# Patient Record
Sex: Female | Born: 1945 | Race: Black or African American | Hispanic: No | Marital: Married | Smoking: Former smoker
Health system: Southern US, Community
[De-identification: ages and names within clinical notes are randomized; demographics above are authoritative.]

## PROBLEM LIST (undated history)

## (undated) DIAGNOSIS — Z972 Presence of dental prosthetic device (complete) (partial): Secondary | ICD-10-CM

## (undated) DIAGNOSIS — J45909 Unspecified asthma, uncomplicated: Secondary | ICD-10-CM

## (undated) DIAGNOSIS — K5909 Other constipation: Secondary | ICD-10-CM

## (undated) DIAGNOSIS — I480 Paroxysmal atrial fibrillation: Secondary | ICD-10-CM

## (undated) DIAGNOSIS — I639 Cerebral infarction, unspecified: Secondary | ICD-10-CM

## (undated) DIAGNOSIS — K2970 Gastritis, unspecified, without bleeding: Secondary | ICD-10-CM

## (undated) DIAGNOSIS — D696 Thrombocytopenia, unspecified: Secondary | ICD-10-CM

## (undated) DIAGNOSIS — E042 Nontoxic multinodular goiter: Secondary | ICD-10-CM

## (undated) DIAGNOSIS — J449 Chronic obstructive pulmonary disease, unspecified: Secondary | ICD-10-CM

## (undated) DIAGNOSIS — K219 Gastro-esophageal reflux disease without esophagitis: Secondary | ICD-10-CM

## (undated) DIAGNOSIS — F419 Anxiety disorder, unspecified: Secondary | ICD-10-CM

## (undated) DIAGNOSIS — E785 Hyperlipidemia, unspecified: Secondary | ICD-10-CM

## (undated) DIAGNOSIS — Z8719 Personal history of other diseases of the digestive system: Secondary | ICD-10-CM

## (undated) DIAGNOSIS — R0600 Dyspnea, unspecified: Secondary | ICD-10-CM

## (undated) DIAGNOSIS — I6930 Unspecified sequelae of cerebral infarction: Secondary | ICD-10-CM

## (undated) DIAGNOSIS — C50919 Malignant neoplasm of unspecified site of unspecified female breast: Secondary | ICD-10-CM

## (undated) DIAGNOSIS — R0609 Other forms of dyspnea: Secondary | ICD-10-CM

## (undated) DIAGNOSIS — E0590 Thyrotoxicosis, unspecified without thyrotoxic crisis or storm: Secondary | ICD-10-CM

## (undated) DIAGNOSIS — Z7901 Long term (current) use of anticoagulants: Secondary | ICD-10-CM

## (undated) DIAGNOSIS — I5022 Chronic systolic (congestive) heart failure: Secondary | ICD-10-CM

## (undated) DIAGNOSIS — I2510 Atherosclerotic heart disease of native coronary artery without angina pectoris: Secondary | ICD-10-CM

## (undated) DIAGNOSIS — J9383 Other pneumothorax: Secondary | ICD-10-CM

## (undated) DIAGNOSIS — M1990 Unspecified osteoarthritis, unspecified site: Secondary | ICD-10-CM

## (undated) DIAGNOSIS — I350 Nonrheumatic aortic (valve) stenosis: Secondary | ICD-10-CM

## (undated) DIAGNOSIS — S329XXA Fracture of unspecified parts of lumbosacral spine and pelvis, initial encounter for closed fracture: Secondary | ICD-10-CM

## (undated) DIAGNOSIS — N183 Chronic kidney disease, stage 3 unspecified: Secondary | ICD-10-CM

## (undated) DIAGNOSIS — Z8709 Personal history of other diseases of the respiratory system: Secondary | ICD-10-CM

## (undated) DIAGNOSIS — M810 Age-related osteoporosis without current pathological fracture: Secondary | ICD-10-CM

## (undated) DIAGNOSIS — I10 Essential (primary) hypertension: Secondary | ICD-10-CM

## (undated) DIAGNOSIS — T7840XA Allergy, unspecified, initial encounter: Secondary | ICD-10-CM

## (undated) DIAGNOSIS — Z923 Personal history of irradiation: Secondary | ICD-10-CM

## (undated) HISTORY — DX: Age-related osteoporosis without current pathological fracture: M81.0

## (undated) HISTORY — DX: Paroxysmal atrial fibrillation: I48.0

## (undated) HISTORY — PX: CARDIAC CATHETERIZATION: SHX172

## (undated) HISTORY — DX: Chronic obstructive pulmonary disease, unspecified: J44.9

## (undated) HISTORY — PX: VALVE REPLACEMENT: SUR13

## (undated) HISTORY — DX: Allergy, unspecified, initial encounter: T78.40XA

---

## 1998-03-01 DIAGNOSIS — I639 Cerebral infarction, unspecified: Secondary | ICD-10-CM

## 1998-03-01 HISTORY — DX: Cerebral infarction, unspecified: I63.9

## 2014-02-12 HISTORY — PX: CARDIAC CATHETERIZATION: SHX172

## 2014-03-01 DIAGNOSIS — I5022 Chronic systolic (congestive) heart failure: Secondary | ICD-10-CM

## 2014-03-01 DIAGNOSIS — I428 Other cardiomyopathies: Secondary | ICD-10-CM

## 2014-03-01 HISTORY — DX: Chronic systolic (congestive) heart failure: I50.22

## 2014-03-01 HISTORY — DX: Other cardiomyopathies: I42.8

## 2014-03-12 DIAGNOSIS — I350 Nonrheumatic aortic (valve) stenosis: Secondary | ICD-10-CM

## 2014-03-12 HISTORY — DX: Nonrheumatic aortic (valve) stenosis: I35.0

## 2014-03-12 HISTORY — PX: AORTIC VALVE REPLACEMENT: SHX41

## 2014-11-19 HISTORY — PX: VIDEO ASSISTED THORACOSCOPY (VATS) W/TALC PLEUADESIS: SHX6168

## 2015-08-06 ENCOUNTER — Ambulatory Visit (HOSPITAL_COMMUNITY)
Admission: EM | Admit: 2015-08-06 | Discharge: 2015-08-06 | Disposition: A | Discharge status: Home / Self Care | Payer: Medicare Other | Attending: Emergency Medicine | Admitting: Emergency Medicine

## 2015-08-06 ENCOUNTER — Encounter (HOSPITAL_COMMUNITY): Payer: Self-pay | Admitting: Nurse Practitioner

## 2015-08-06 DIAGNOSIS — W1839XA Other fall on same level, initial encounter: Secondary | ICD-10-CM

## 2015-08-06 DIAGNOSIS — W010XXA Fall on same level from slipping, tripping and stumbling without subsequent striking against object, initial encounter: Secondary | ICD-10-CM

## 2015-08-06 DIAGNOSIS — R0789 Other chest pain: Secondary | ICD-10-CM

## 2015-08-06 HISTORY — DX: Unspecified asthma, uncomplicated: J45.909

## 2015-08-06 HISTORY — DX: Essential (primary) hypertension: I10

## 2015-08-06 MED ORDER — TRAMADOL-ACETAMINOPHEN 37.5-325 MG PO TABS: 1.0000 | ORAL_TABLET | Freq: Four times a day (QID) | ORAL | Status: DC | PRN

## 2015-08-06 NOTE — ED Provider Notes (Signed)
CSN: [id number]     Arrival date & time 08/06/15  1316 History   First MD Initiated Contact with Patient 08/06/15 1423     Chief Complaint  Patient presents with  . Rib Injury   (Consider location/radiation/quality/duration/timing/severity/associated sxs/prior Treatment) HPI Comments: 70 year old female that she fell one week ago and during the fall braced herself with her left arm. Since that time she has had soreness to the left lateral chest wall and left breast. She states the pain is primarily present with cough and sniffing. She does not have an increasing cough. She does not have shortness of breath or DOE. Denies anterior chest pain. She states most of the discomfort is superficial along the lateral aspect of the left breast and chest wall. She states that the pain is not worse and she has not developed any new symptoms. She has been taking Tylenol, 3 tablets at a time and it only partially relieves the pain. She is pleasant, alert, jovial, energetic and showing no signs of distress.   Past Medical History  Diagnosis Date  . Hypertension   . Asthma    History reviewed. No pertinent past surgical history. History reviewed. No pertinent family history. Social History  Substance Use Topics  . Smoking status: Never Smoker   . Smokeless tobacco: None  . Alcohol Use: None   OB History    No data available     Review of Systems  Constitutional: Negative.   HENT: Negative.   Respiratory: Negative for cough, chest tightness and shortness of breath.   Cardiovascular: Positive for chest pain.       No heaviness, tightness, fullness or pressure. No anterior chest pain.  Gastrointestinal: Negative.   Musculoskeletal: Negative for back pain, gait problem, neck pain and neck stiffness.  Skin:       Small area of ecchymosis to the left lateral lower chest wall along the costal margin  Neurological: Negative.   All other systems reviewed and are negative.   Allergies  Review of  patient's allergies indicates no known allergies.  Home Medications   Prior to Admission medications   Medication Sig Start Date End Date Taking? Authorizing Provider  albuterol (PROVENTIL) (2.5 MG/3ML) 0.083% nebulizer solution    Yes Historical Provider, MD  carvedilol (COREG) 6.25 MG tablet Take by mouth.   Yes Historical Provider, MD  lisinopril (PRINIVIL,ZESTRIL) 5 MG tablet Take by mouth.   Yes Historical Provider, MD  senna-docusate (SENOKOT-S) 8.6-50 MG tablet daily.   Yes Historical Provider, MD  warfarin (COUMADIN) 1 MG tablet Take by mouth daily.   Yes Historical Provider, MD  traMADol-acetaminophen (ULTRACET) 37.5-325 MG tablet Take 1 tablet by mouth every 6 (six) hours as needed. 08/06/15   Janne Napoleon, NP   Meds Ordered and Administered this Visit  Medications - No data to display  BP 149/91 mmHg  Pulse 87  Temp(Src) 97.6 F (36.4 C) (Oral)  Resp 17  SpO2 100% No data found.   Physical Exam  Constitutional: She is oriented to person, place, and time. She appears well-developed and well-nourished. No distress.  Eyes: EOM are normal.  Neck: Normal range of motion. Neck supple.  Cardiovascular: Normal rate, regular rhythm and normal heart sounds.   Pulmonary/Chest: Effort normal and breath sounds normal. No respiratory distress. She has no wheezes. She has no rales.  Minor tenderness to the left lateral chest wall and left lateral breast. No palpable deformities. No discolorations, swelling. Lung sounds are clear. No rubs or other adventitious  sounds. Good air movement and expansion.  Abdominal: Soft. There is no tenderness.  Musculoskeletal: Normal range of motion. She exhibits no edema.  Neurological: She is alert and oriented to person, place, and time. She exhibits normal muscle tone.  Skin: Skin is warm and dry. No erythema.  Psychiatric: She has a normal mood and affect.  Nursing note and vitals reviewed.   ED Course  Procedures (including critical care  time)  Labs Review Labs Reviewed - No data to display  Imaging Review No results found.   Visual Acuity Review  Right Eye Distance:   Left Eye Distance:   Bilateral Distance:    Right Eye Near:   Left Eye Near:    Bilateral Near:         MDM   1. Fall from slip, trip, or stumble, initial encounter    Meds ordered this encounter  Medications  . carvedilol (COREG) 6.25 MG tablet    Sig: Take by mouth.  Marland Kitchen lisinopril (PRINIVIL,ZESTRIL) 5 MG tablet    Sig: Take by mouth.  . warfarin (COUMADIN) 1 MG tablet    Sig: Take by mouth daily.  Marland Kitchen albuterol (PROVENTIL) (2.5 MG/3ML) 0.083% nebulizer solution    Sig:   . senna-docusate (SENOKOT-S) 8.6-50 MG tablet    Sig: daily.  . traMADol-acetaminophen (ULTRACET) 37.5-325 MG tablet    Sig: Take 1 tablet by mouth every 6 (six) hours as needed.    Dispense:  20 tablet    Refill:  0    Order Specific Question:  Supervising Provider    Answer:  Melony Overly [[id number]]   Tramadol Rx only Cold compresses Red flags and info on chestwall injuries and strains.    Janne Napoleon, NP 08/06/15 1454

## 2015-08-06 NOTE — ED Notes (Addendum)
She c/o 6 day history of L sided rib pain after a fall. Pain is increased by inspiration and cough. She has been taking tylenol at home but pain has not improved. She is on coumadin, bruise noted to L chest wall. She is alert and breathing easily

## 2015-08-19 ENCOUNTER — Observation Stay (HOSPITAL_COMMUNITY)
Admission: EM | Admit: 2015-08-19 | Discharge: 2015-08-21 | Disposition: A | Discharge status: Home / Self Care | Payer: Medicare Other | Attending: Internal Medicine | Admitting: Internal Medicine

## 2015-08-19 ENCOUNTER — Emergency Department (HOSPITAL_COMMUNITY): Payer: Medicare Other

## 2015-08-19 ENCOUNTER — Encounter (HOSPITAL_COMMUNITY): Payer: Self-pay | Admitting: Emergency Medicine

## 2015-08-19 DIAGNOSIS — Z8673 Personal history of transient ischemic attack (TIA), and cerebral infarction without residual deficits: Secondary | ICD-10-CM | POA: Insufficient documentation

## 2015-08-19 DIAGNOSIS — I110 Hypertensive heart disease with heart failure: Secondary | ICD-10-CM | POA: Diagnosis not present

## 2015-08-19 DIAGNOSIS — E782 Mixed hyperlipidemia: Secondary | ICD-10-CM | POA: Diagnosis present

## 2015-08-19 DIAGNOSIS — B009 Herpesviral infection, unspecified: Secondary | ICD-10-CM | POA: Diagnosis not present

## 2015-08-19 DIAGNOSIS — S3210XA Unspecified fracture of sacrum, initial encounter for closed fracture: Secondary | ICD-10-CM | POA: Diagnosis not present

## 2015-08-19 DIAGNOSIS — J4520 Mild intermittent asthma, uncomplicated: Secondary | ICD-10-CM | POA: Diagnosis not present

## 2015-08-19 DIAGNOSIS — S32811A Multiple fractures of pelvis with unstable disruption of pelvic ring, initial encounter for closed fracture: Secondary | ICD-10-CM | POA: Diagnosis not present

## 2015-08-19 DIAGNOSIS — W19XXXA Unspecified fall, initial encounter: Secondary | ICD-10-CM | POA: Diagnosis not present

## 2015-08-19 DIAGNOSIS — I350 Nonrheumatic aortic (valve) stenosis: Secondary | ICD-10-CM | POA: Diagnosis not present

## 2015-08-19 DIAGNOSIS — Z952 Presence of prosthetic heart valve: Secondary | ICD-10-CM | POA: Insufficient documentation

## 2015-08-19 DIAGNOSIS — Y92009 Unspecified place in unspecified non-institutional (private) residence as the place of occurrence of the external cause: Secondary | ICD-10-CM

## 2015-08-19 DIAGNOSIS — I10 Essential (primary) hypertension: Secondary | ICD-10-CM | POA: Diagnosis present

## 2015-08-19 DIAGNOSIS — I4891 Unspecified atrial fibrillation: Secondary | ICD-10-CM | POA: Insufficient documentation

## 2015-08-19 DIAGNOSIS — E785 Hyperlipidemia, unspecified: Secondary | ICD-10-CM | POA: Diagnosis present

## 2015-08-19 DIAGNOSIS — J45909 Unspecified asthma, uncomplicated: Secondary | ICD-10-CM | POA: Diagnosis not present

## 2015-08-19 DIAGNOSIS — Y92099 Unspecified place in other non-institutional residence as the place of occurrence of the external cause: Secondary | ICD-10-CM

## 2015-08-19 DIAGNOSIS — I5022 Chronic systolic (congestive) heart failure: Secondary | ICD-10-CM | POA: Diagnosis present

## 2015-08-19 DIAGNOSIS — Z9181 History of falling: Secondary | ICD-10-CM

## 2015-08-19 DIAGNOSIS — I2510 Atherosclerotic heart disease of native coronary artery without angina pectoris: Secondary | ICD-10-CM | POA: Diagnosis present

## 2015-08-19 DIAGNOSIS — S329XXA Fracture of unspecified parts of lumbosacral spine and pelvis, initial encounter for closed fracture: Secondary | ICD-10-CM

## 2015-08-19 DIAGNOSIS — S32492A Other specified fracture of left acetabulum, initial encounter for closed fracture: Secondary | ICD-10-CM | POA: Diagnosis not present

## 2015-08-19 DIAGNOSIS — E0590 Thyrotoxicosis, unspecified without thyrotoxic crisis or storm: Secondary | ICD-10-CM | POA: Diagnosis present

## 2015-08-19 DIAGNOSIS — I480 Paroxysmal atrial fibrillation: Secondary | ICD-10-CM | POA: Diagnosis not present

## 2015-08-19 DIAGNOSIS — I2583 Coronary atherosclerosis due to lipid rich plaque: Secondary | ICD-10-CM

## 2015-08-19 DIAGNOSIS — S32810A Multiple fractures of pelvis with stable disruption of pelvic ring, initial encounter for closed fracture: Secondary | ICD-10-CM | POA: Diagnosis not present

## 2015-08-19 DIAGNOSIS — M25552 Pain in left hip: Secondary | ICD-10-CM | POA: Diagnosis not present

## 2015-08-19 HISTORY — DX: Nonrheumatic aortic (valve) stenosis: I35.0

## 2015-08-19 HISTORY — DX: Atherosclerotic heart disease of native coronary artery without angina pectoris: I25.10

## 2015-08-19 HISTORY — DX: Gastritis, unspecified, without bleeding: K29.70

## 2015-08-19 HISTORY — DX: Nontoxic multinodular goiter: E04.2

## 2015-08-19 HISTORY — DX: Fracture of unspecified parts of lumbosacral spine and pelvis, initial encounter for closed fracture: S32.9XXA

## 2015-08-19 HISTORY — DX: Hyperlipidemia, unspecified: E78.5

## 2015-08-19 HISTORY — DX: Thyrotoxicosis, unspecified without thyrotoxic crisis or storm: E05.90

## 2015-08-19 HISTORY — DX: Chronic systolic (congestive) heart failure: I50.22

## 2015-08-19 HISTORY — DX: Cerebral infarction, unspecified: I63.9

## 2015-08-19 HISTORY — DX: Unspecified osteoarthritis, unspecified site: M19.90

## 2015-08-19 LAB — BASIC METABOLIC PANEL
Anion gap: 7 (ref 5–15)
BUN: 17 mg/dL (ref 6–20)
CHLORIDE: 110 mmol/L (ref 101–111)
CO2: 23 mmol/L (ref 22–32)
CREATININE: 1.09 mg/dL — AB (ref 0.44–1.00)
Calcium: 10 mg/dL (ref 8.9–10.3)
GFR calc non Af Amer: 51 mL/min — ABNORMAL LOW (ref >60)
GFR, EST AFRICAN AMERICAN: 59 mL/min — AB (ref >60)
Glucose, Bld: 105 mg/dL — ABNORMAL HIGH (ref 65–99)
Potassium: 4.8 mmol/L (ref 3.5–5.1)
Sodium: 140 mmol/L (ref 135–145)

## 2015-08-19 LAB — CBC
HEMATOCRIT: 38.5 % (ref 36.0–46.0)
HEMOGLOBIN: 12.4 g/dL (ref 12.0–15.0)
MCH: 30.2 pg (ref 26.0–34.0)
MCHC: 32.2 g/dL (ref 30.0–36.0)
MCV: 93.7 fL (ref 78.0–100.0)
Platelets: 128 10*3/uL — ABNORMAL LOW (ref 150–400)
RBC: 4.11 MIL/uL (ref 3.87–5.11)
RDW: 13.2 % (ref 11.5–15.5)
WBC: 9.6 10*3/uL (ref 4.0–10.5)

## 2015-08-19 LAB — PROTIME-INR
INR: 2.69 — AB (ref 0.00–1.49)
Prothrombin Time: 28.2 seconds — ABNORMAL HIGH (ref 11.6–15.2)

## 2015-08-19 MED ORDER — ALBUTEROL SULFATE (2.5 MG/3ML) 0.083% IN NEBU: 3.0000 mL | INHALATION_SOLUTION | Freq: Four times a day (QID) | RESPIRATORY_TRACT | Status: DC | PRN

## 2015-08-19 MED ORDER — HYDROMORPHONE HCL 1 MG/ML IJ SOLN
0.5000 mg | Freq: Once | INTRAMUSCULAR | Status: AC
  Administered 2015-08-19: 0.5 mg via INTRAVENOUS
  Filled 2015-08-19: qty 1

## 2015-08-19 MED ORDER — SODIUM CHLORIDE 0.9% FLUSH: 3.0000 mL | INTRAVENOUS | Status: DC | PRN

## 2015-08-19 MED ORDER — LISINOPRIL 5 MG PO TABS
5.0000 mg | ORAL_TABLET | Freq: Every day | ORAL | Status: DC
  Filled 2015-08-19: qty 1

## 2015-08-19 MED ORDER — MAGNESIUM CITRATE PO SOLN: 1.0000 | Freq: Once | ORAL | Status: DC | PRN

## 2015-08-19 MED ORDER — WARFARIN SODIUM 7.5 MG PO TABS: 7.5000 mg | ORAL_TABLET | ORAL | Status: DC

## 2015-08-19 MED ORDER — SENNOSIDES-DOCUSATE SODIUM 8.6-50 MG PO TABS
1.0000 | ORAL_TABLET | Freq: Every day | ORAL | Status: DC
  Administered 2015-08-19 – 2015-08-21 (×2): 1 via ORAL
  Filled 2015-08-19 (×3): qty 1

## 2015-08-19 MED ORDER — FAMOTIDINE 20 MG PO TABS
20.0000 mg | ORAL_TABLET | Freq: Every day | ORAL | Status: DC
  Administered 2015-08-20 – 2015-08-21 (×2): 20 mg via ORAL
  Filled 2015-08-19 (×2): qty 1

## 2015-08-19 MED ORDER — ONDANSETRON HCL 4 MG/2ML IJ SOLN
4.0000 mg | Freq: Once | INTRAMUSCULAR | Status: AC
  Administered 2015-08-19: 4 mg via INTRAVENOUS
  Filled 2015-08-19: qty 2

## 2015-08-19 MED ORDER — MORPHINE SULFATE (PF) 4 MG/ML IV SOLN
4.0000 mg | Freq: Once | INTRAVENOUS | Status: AC
  Administered 2015-08-19: 4 mg via INTRAVENOUS
  Filled 2015-08-19: qty 1

## 2015-08-19 MED ORDER — SODIUM CHLORIDE 0.9% FLUSH
3.0000 mL | Freq: Two times a day (BID) | INTRAVENOUS | Status: DC
  Administered 2015-08-19 – 2015-08-20 (×3): 3 mL via INTRAVENOUS

## 2015-08-19 MED ORDER — ATORVASTATIN CALCIUM 40 MG PO TABS
40.0000 mg | ORAL_TABLET | Freq: Every day | ORAL | Status: DC
  Administered 2015-08-19 – 2015-08-20 (×2): 40 mg via ORAL
  Filled 2015-08-19 (×2): qty 1

## 2015-08-19 MED ORDER — ONDANSETRON HCL 4 MG PO TABS: 4.0000 mg | ORAL_TABLET | Freq: Four times a day (QID) | ORAL | Status: DC | PRN

## 2015-08-19 MED ORDER — SODIUM CHLORIDE 0.9 % IV SOLN: 250.0000 mL | INTRAVENOUS | Status: DC | PRN

## 2015-08-19 MED ORDER — METHIMAZOLE 5 MG PO TABS
15.0000 mg | ORAL_TABLET | Freq: Every evening | ORAL | Status: DC
  Administered 2015-08-19 – 2015-08-20 (×2): 15 mg via ORAL
  Filled 2015-08-19 (×3): qty 1

## 2015-08-19 MED ORDER — ONDANSETRON HCL 4 MG/2ML IJ SOLN: 4.0000 mg | Freq: Four times a day (QID) | INTRAMUSCULAR | Status: DC | PRN

## 2015-08-19 MED ORDER — MOMETASONE FURO-FORMOTEROL FUM 200-5 MCG/ACT IN AERO
2.0000 | INHALATION_SPRAY | Freq: Two times a day (BID) | RESPIRATORY_TRACT | Status: DC
  Administered 2015-08-19 – 2015-08-21 (×4): 2 via RESPIRATORY_TRACT
  Filled 2015-08-19 (×2): qty 8.8

## 2015-08-19 MED ORDER — ASPIRIN EC 81 MG PO TBEC
81.0000 mg | DELAYED_RELEASE_TABLET | Freq: Every day | ORAL | Status: DC
  Administered 2015-08-20 – 2015-08-21 (×2): 81 mg via ORAL
  Filled 2015-08-19 (×2): qty 1

## 2015-08-19 MED ORDER — MORPHINE SULFATE (PF) 4 MG/ML IV SOLN
4.0000 mg | INTRAVENOUS | Status: DC | PRN
  Administered 2015-08-19 – 2015-08-20 (×5): 4 mg via INTRAVENOUS
  Filled 2015-08-19 (×5): qty 1

## 2015-08-19 MED ORDER — WARFARIN SODIUM 5 MG PO TABS
5.0000 mg | ORAL_TABLET | ORAL | Status: DC
  Administered 2015-08-19: 5 mg via ORAL
  Filled 2015-08-19: qty 1

## 2015-08-19 MED ORDER — CARVEDILOL 6.25 MG PO TABS
18.7500 mg | ORAL_TABLET | Freq: Two times a day (BID) | ORAL | Status: DC
  Administered 2015-08-19 – 2015-08-20 (×2): 18.75 mg via ORAL
  Filled 2015-08-19 (×3): qty 1

## 2015-08-19 MED ORDER — ACETAMINOPHEN 500 MG PO TABS
500.0000 mg | ORAL_TABLET | Freq: Three times a day (TID) | ORAL | Status: DC | PRN
  Administered 2015-08-21 (×2): 1000 mg via ORAL
  Filled 2015-08-19 (×2): qty 2

## 2015-08-19 MED ORDER — SORBITOL 70 % SOLN: 30.0000 mL | Freq: Every day | Status: DC | PRN

## 2015-08-19 MED ORDER — WARFARIN - PHARMACIST DOSING INPATIENT: Freq: Every day | Status: DC

## 2015-08-19 MED ORDER — TRAMADOL HCL 50 MG PO TABS
50.0000 mg | ORAL_TABLET | Freq: Four times a day (QID) | ORAL | Status: DC | PRN
  Administered 2015-08-20 – 2015-08-21 (×4): 50 mg via ORAL
  Filled 2015-08-19 (×4): qty 1

## 2015-08-19 NOTE — Consult Note (Signed)
Reason for Consult:  Left pelvic pain after fall Referring Physician: Dr. Elie Goody Pulliam-McEachean is an 70 y.o. female.  HPI: 70 y/o female with PMH of CAD fell this morning from a standing height landing on her buttocks.  Danielle [name] denies LOC.  Danielle [name] was unable to bear weight on the L LE.  Danielle [name] denies any h/o injury or surgery to the left hip.  Danielle [name] c/o dull aching pain in the left pelvis that is mild at rest and more sharp and severe with motion of the LLE.  Danielle [name] denies numbness, tingling or weakness in the L LE.  Danielle [name] is not a smoker and is not diabetic.  Danielle [name] is admitted to the hospitalist service under the care of Dr. Rockne Menghini.  Past Medical History  Diagnosis Date  . Hypertension   . Asthma   . Hyperlipidemia   . Aortic stenosis     Status post bioprosthetic AVR  . Coronary artery disease   . Stroke ([place])   . Multiple thyroid nodules   . Hyperthyroidism   . Gastritis   . Chronic systolic CHF (congestive heart failure) (HCC)     EF 30% 04/2014    Past Surgical History  Procedure Laterality Date  . Valve replacement      Family History  Problem Relation Age of Onset  . Diabetes Mother   . Diabetes Father   . Diabetes Sister   . Diabetes Sister   . HIV Brother   . Lung cancer Father   . Heart attack Mother     Social History: Danielle [name] has a remote h/o cigarette smoking.  Danielle [name] recently moved to [place] with her husband from [place] to be closer to his family.  Danielle [name] is retired.  Allergies:  Allergies  Allergen Reactions  . Tetanus Toxoid Adsorbed Swelling    Unknown     Medications: I have reviewed the patient's current medications.  Results for orders placed or performed during the hospital encounter of 08/19/15 (from the past 48 hour(s))  CBC     Status: Abnormal   Collection Time: 08/19/15  4:42 PM  Result Value Ref Range   WBC 9.6 4.0 - 10.5 K/uL   RBC 4.11 3.87 - 5.11 MIL/uL   Hemoglobin 12.4 12.0 - 15.0 g/dL   HCT 38.5 36.0 - 46.0 %   MCV 93.7 78.0 - 100.0 fL    MCH 30.2 26.0 - 34.0 pg   MCHC 32.2 30.0 - 36.0 g/dL   RDW 13.2 11.5 - 15.5 %   Platelets 128 (L) 150 - 400 K/uL  Basic metabolic panel     Status: Abnormal   Collection Time: 08/19/15  4:42 PM  Result Value Ref Range   Sodium 140 135 - 145 mmol/L   Potassium 4.8 3.5 - 5.1 mmol/L   Chloride 110 101 - 111 mmol/L   CO2 23 22 - 32 mmol/L   Glucose, Bld 105 (H) 65 - 99 mg/dL   BUN 17 6 - 20 mg/dL   Creatinine, Ser 1.09 (H) 0.44 - 1.00 mg/dL   Calcium 10.0 8.9 - 10.3 mg/dL   GFR calc non Af Amer 51 (L) >60 mL/min   GFR calc Af Amer 59 (L) >60 mL/min    Comment: (NOTE) The eGFR has been calculated using the CKD EPI equation. This calculation has not been validated in all clinical situations. eGFR's persistently <60 mL/min signify possible Chronic Kidney Disease.    Anion gap 7 5 - 15  Protime-INR  Status: Abnormal   Collection Time: 08/19/15  4:42 PM  Result Value Ref Range   Prothrombin Time 28.2 (H) 11.6 - 15.2 seconds   INR 2.69 (H) 0.00 - 1.49    Ct Hip Left Wo Contrast  08/19/2015  CLINICAL DATA:  Posterior lateral left hip pain status post fall EXAM: CT OF THE LEFT HIP WITHOUT CONTRAST TECHNIQUE: Multidetector CT imaging of the left hip was performed according to the standard protocol. Multiplanar CT image reconstructions were also generated. COMPARISON:  None. FINDINGS: Bones/Joint/Cartilage Nondisplaced fracture of the left sacral ala. Nondisplaced fracture of the left superior pubic ramus at the acetabular junction. Minimally displaced fracture of the left inferior pubic ramus. No left hip fracture or dislocation. Joint space narrowing of the left hip with marginal osteophytosis consistent with mild-moderate osteoarthritis. Normal alignment. No aggressive lytic or sclerotic osseous lesion. Degenerative disc disease with disc height loss at L4-5. Muscles Normal. Soft tissue No fluid collection or hematoma.  No soft tissue mass. IMPRESSION: 1. Nondisplaced fracture of the left  superior pubic ramus at the acetabular junction. Minimally displaced fracture of the left inferior pubic ramus. 2. Nondisplaced fracture of the left sacral ala. Electronically Signed   By: Kathreen Devoid   On: 08/19/2015 16:23   Dg Hip Unilat With Pelvis 2-3 Views Left  08/19/2015  CLINICAL DATA:  Posterior and lateral left hip pain status post fall. EXAM: DG HIP (WITH OR WITHOUT PELVIS) 2-3V LEFT COMPARISON:  None. FINDINGS: There is no evidence of displaced left hip fracture, however the left hip is abnormally internally rotated. Curvilinear line is seen extending transversely through the acetabulum, raising the question about nondisplaced or minimally displaced acetabular fracture. Soft tissues are grossly normal. IMPRESSION: Abnormal rotation of the left hip with curvilinear line extending through the acetabulum. Query nondisplaced or minimally displaces fracture. Confirmation with cross-sectional imaging may be considered. Electronically Signed   By: Fidela Salisbury M.D.   On: 08/19/2015 13:53    ROS:  No recent f/c/n/v/wt loss PE:  Blood pressure 120/97, pulse 43, temperature 98.6 F (37 C), temperature source Oral, resp. rate 19, SpO2 96 %. wn wd woman in nad.  A and O X 4.  Mood and affect normal.  EOMI.  resp unlabored.  L pubic ramus TTP.  Pain is recreated with lateral compression of the pelvis.  L LE with healthy and intact skin.  2+ dp and pt pulses.  Feels LT normally throughout the L LE.  5/5 strength at the quad, tib ant, gastroc.  No lymphadenopathy.  Pain recreated with log roll of L LE.  Assessment/Plan: L pelvic LC fracture - I explained the nature of the injury to the patient in detail.  I believe this fracture can be treated successfully in closed fashion.  Danielle [name] can be TTWB on the L LE with PT starting tomorrow.  Danielle [name]'ll likely need pain control for 7-10 days.   Danielle [name] understands the plan and agrees.  I'll follow while Danielle [name]'s an inpatient and will plan to see her back in the  office.  Wylene Simmer 08/19/2015, 9:21 PM

## 2015-08-19 NOTE — ED Provider Notes (Signed)
CSN: [id number]     Arrival date & time 08/19/15  1254 History   First MD Initiated Contact with Patient 08/19/15 1504     Chief Complaint  Patient presents with  . Fall  . Hip Pain     (Consider location/radiation/quality/duration/timing/severity/associated sxs/prior Treatment) HPI Comments: Patient with history of frequent falls, h/o valve surgery in 2016 on coumadin last INR check >2mo -- presents with complaint of left hip pain after a fall occurring approximately 6 AM. Patient states that she was reaching for an item in her pantry and lost her balance and fell. She did not have any chest pain or shortness of breath and did not lose consciousness at the time of the fall. She did not hit her head or hurt her neck. She fell directly onto her buttocks. She complained of immediate pain in her tailbone area as well as her left hip. She was assisted up by family member and was able to take several steps with great pain. Family had to roll her to the car on a desk chair to transfer her to the hospital. Patient applied a heating pad and Aspercreme prior to arrival which did not help. The onset of this condition was acute. The course is constant. Aggravating factors: movement and palpation. Alleviating factors: none.    The history is provided by the patient and medical records.    Past Medical History  Diagnosis Date  . Hypertension   . Asthma    Past Surgical History  Procedure Laterality Date  . Valve replacement     No family history on file. Social History  Substance Use Topics  . Smoking status: Never Smoker   . Smokeless tobacco: None  . Alcohol Use: No   OB History    No data available     Review of Systems  Constitutional: Negative for fever and unexpected weight change.  HENT: Negative for rhinorrhea and sore throat.   Eyes: Negative for redness.  Respiratory: Negative for cough.   Cardiovascular: Negative for chest pain.  Gastrointestinal: Negative for nausea,  vomiting, abdominal pain, diarrhea and constipation.       Negative for fecal incontinence.   Genitourinary: Negative for dysuria, hematuria, flank pain, vaginal bleeding, vaginal discharge and pelvic pain.       Negative for urinary incontinence or retention.  Musculoskeletal: Positive for myalgias, back pain and gait problem.  Skin: Negative for rash.  Neurological: Negative for weakness, numbness and headaches.       Denies saddle paresthesias.      Allergies  Review of patient's allergies indicates no known allergies.  Home Medications   Prior to Admission medications   Medication Sig Start Date End Date Taking? Authorizing Provider  albuterol (PROVENTIL) (2.5 MG/3ML) 0.083% nebulizer solution     Historical Provider, MD  carvedilol (COREG) 6.25 MG tablet Take by mouth.    Historical Provider, MD  lisinopril (PRINIVIL,ZESTRIL) 5 MG tablet Take by mouth.    Historical Provider, MD  senna-docusate (SENOKOT-S) 8.6-50 MG tablet daily.    Historical Provider, MD  traMADol-acetaminophen (ULTRACET) 37.5-325 MG tablet Take 1 tablet by mouth every 6 (six) hours as needed. 08/06/15   Janne Napoleon, NP  warfarin (COUMADIN) 1 MG tablet Take by mouth daily.    Historical Provider, MD   BP 144/87 mmHg  Pulse 102  Temp(Src) 98.6 F (37 C) (Oral)  Resp 27  SpO2 96% Physical Exam  Constitutional: She appears well-developed and well-nourished.  HENT:  Head: Normocephalic and atraumatic.  Eyes: Conjunctivae are normal.  Neck: Normal range of motion. Neck supple.  Pulmonary/Chest: Effort normal.  Abdominal: Soft. There is no tenderness. There is no CVA tenderness.  Musculoskeletal:       Right hip: Normal.       Left hip: She exhibits decreased range of motion (pain with flexion against gravity), tenderness and bony tenderness (laterally).       Right knee: Normal.       Left knee: Normal.       Left ankle: Normal.       Cervical back: Normal.       Thoracic back: Normal.       Lumbar  back: She exhibits tenderness (sacrum/coccyx) and bony tenderness.       Left upper leg: Normal.       Left lower leg: Normal.       Left foot: Normal.  No step-off noted with palpation of spine.   Neurological: She is alert. She has normal strength and normal reflexes. No sensory deficit.  5/5 strength in entire lower extremities bilaterally. No sensation deficit.   Skin: Skin is warm and dry. No rash noted.  Psychiatric: She has a normal mood and affect.  Nursing note and vitals reviewed.   ED Course  Procedures (including critical care time) Labs Review Labs Reviewed  CBC - Abnormal; Notable for the following:    Platelets 128 (*)    All other components within normal limits  BASIC METABOLIC PANEL - Abnormal; Notable for the following:    Glucose, Bld 105 (*)    Creatinine, Ser 1.09 (*)    GFR calc non Af Amer 51 (*)    GFR calc Af Amer 59 (*)    All other components within normal limits  PROTIME-INR - Abnormal; Notable for the following:    Prothrombin Time 28.2 (*)    INR 2.69 (*)    All other components within normal limits    Imaging Review Ct Hip Left Wo Contrast  08/19/2015  CLINICAL DATA:  Posterior lateral left hip pain status post fall EXAM: CT OF THE LEFT HIP WITHOUT CONTRAST TECHNIQUE: Multidetector CT imaging of the left hip was performed according to the standard protocol. Multiplanar CT image reconstructions were also generated. COMPARISON:  None. FINDINGS: Bones/Joint/Cartilage Nondisplaced fracture of the left sacral ala. Nondisplaced fracture of the left superior pubic ramus at the acetabular junction. Minimally displaced fracture of the left inferior pubic ramus. No left hip fracture or dislocation. Joint space narrowing of the left hip with marginal osteophytosis consistent with mild-moderate osteoarthritis. Normal alignment. No aggressive lytic or sclerotic osseous lesion. Degenerative disc disease with disc height loss at L4-5. Muscles Normal. Soft tissue No  fluid collection or hematoma.  No soft tissue mass. IMPRESSION: 1. Nondisplaced fracture of the left superior pubic ramus at the acetabular junction. Minimally displaced fracture of the left inferior pubic ramus. 2. Nondisplaced fracture of the left sacral ala. Electronically Signed   By: Kathreen Devoid   On: 08/19/2015 16:23   Dg Hip Unilat With Pelvis 2-3 Views Left  08/19/2015  CLINICAL DATA:  Posterior and lateral left hip pain status post fall. EXAM: DG HIP (WITH OR WITHOUT PELVIS) 2-3V LEFT COMPARISON:  None. FINDINGS: There is no evidence of displaced left hip fracture, however the left hip is abnormally internally rotated. Curvilinear line is seen extending transversely through the acetabulum, raising the question about nondisplaced or minimally displaced acetabular fracture. Soft tissues are grossly normal. IMPRESSION: Abnormal rotation of  the left hip with curvilinear line extending through the acetabulum. Query nondisplaced or minimally displaces fracture. Confirmation with cross-sectional imaging may be considered. Electronically Signed   By: Fidela Salisbury M.D.   On: 08/19/2015 13:53   I have personally reviewed and evaluated these images and lab results as part of my medical decision-making.  3:28 PM Patient seen and examined. Work-up initiated. Medications ordered.   Vital signs reviewed and are as follows: BP 144/87 mmHg  Pulse 102  Temp(Src) 98.6 F (37 C) (Oral)  Resp 27  SpO2 96%  4:49 PM Spoke with ortho, Dr. Doran Durand, will consult. Reccs will likely be PT, weight bearing as tolerated.   Will need admission to medicine for pain control, PT. She has required IV pain medications x 2.   Discussed with Dr. Jeanell Sparrow who will see. Pending labs at this time.   6:50 PM Spoke with Dr. Rockne Menghini who will see patient for admit. Med-surg/obs bed requested.   MDM   Final diagnoses:  Multiple closed fractures of pelvis with unstable disruption of pelvic circle, initial encounter ([facility])   Closed fracture of sacrum, unspecified fracture morphology, initial encounter ([place])   Admit.    Carlisle Cater, PA-C 08/19/15 1851  Pattricia Boss, MD 08/19/15 [id number]

## 2015-08-19 NOTE — ED Notes (Signed)
Patient states she was in her kitchen this am loss her balance and fell  C/o left hip pain . Husband at bedside. States pateint loss her balance and fell several weeks ago.

## 2015-08-19 NOTE — H&P (Signed)
History and Physical:    Danielle [name]   [id number] DOB: 06/02/1945 DOA: 08/19/2015  Referring MD/provider: Dr. Pattricia Boss PCP: Maximino Greenland, MD   Patient coming from: Home.  Chief Complaint: LEFT pelvic pain  History of Present Illness:   Danielle [name] is an 70 y.o. female with a PMH of stable asthma (uses an inhaler twice a day), hypertension, paroxysmal atrial fibrillation on chronic Coumadin, aortic stenosis status post aortic valve replacement with a bioprosthetic valve, chronic systolic CHF with an EF of 30% per echo done in March 2016, and hyperlipidemia who suffered from a mechanical fall when she lost her footing earlier today. She was reaching for an item in her pantry and lost her balance. She did not have any symptoms of dizziness or presyncope prior to the fall, and did not lose consciousness. There was no head trauma. She fell onto her buttocks and immediately had pain in her tailbone and left hip. She attempted to ambulate with the assistance of a family member, but experienced severe pain. She is brought to the hospital for further evaluation after a heating pad and Aspercreme did not help her pain. Movement exacerbates the pain and lying still eases it off.  ED Course: The patient was given IV morphine and Dilaudid for pain control. Plain films showed a possible nondisplaced acetabular fracture. CT of the left hip was subsequently performed and showed a nondisplaced fracture of the left superior pubic ramus at the acetabular junction and a minimally displaced fracture of the left inferior pubic ramus as well as a nondisplaced fracture of the left sacral ala. Dr. Doran Durand was consulted by the EDP and will evaluate the patient, but has indicated that management will be nonoperative with pain control and weightbearing as tolerated.  ROS:   Review of Systems  Constitutional: Negative for fever, chills, weight loss and malaise/fatigue.    Respiratory: Positive for shortness of breath. Negative for cough.   Cardiovascular: Negative for chest pain and palpitations.  Gastrointestinal: Positive for nausea. Negative for abdominal pain, blood in stool and melena.  Genitourinary: Negative.   Musculoskeletal: Positive for joint pain and falls.  Skin: Negative.   Neurological: Negative for dizziness and weakness.  Endo/Heme/Allergies: Bruises/bleeds easily.   All other systems were reviewed are are negative.  Past Medical History:   Past Medical History  Diagnosis Date  . Hypertension   . Asthma   . Hyperlipidemia   . Aortic stenosis     Status post bioprosthetic AVR  . Coronary artery disease   . Stroke ([place])   . Multiple thyroid nodules   . Hyperthyroidism   . Gastritis   . Chronic systolic CHF (congestive heart failure) (HCC)     EF 30% 04/2014    Past Surgical History:   Past Surgical History  Procedure Laterality Date  . Valve replacement      Social History:   Social History   Social History  . Marital Status: Married    Spouse Name: [name]  . Number of Children: 0  . Years of Education: N/A   Occupational History  . Retired in 2004    Social History Main Topics  . Smoking status: Never Smoker   . Smokeless tobacco: Not on file  . Alcohol Use: No  . Drug Use: Not on file  . Sexual Activity: Not on file   Other Topics Concern  . Not on file   Social History Narrative   Lives with husband.  Ambulated independently.  Allergies   Tetanus toxoid adsorbed  Family history:   Family History  Problem Relation Age of Onset  . Diabetes Mother   . Diabetes Father   . Diabetes Sister   . Diabetes Sister   . HIV Brother   . Lung cancer Father   . Heart attack Mother     Current Medications:   Prior to Admission medications   Medication Sig Start Date End Date Taking? Authorizing Provider  acetaminophen (TYLENOL) 500 MG tablet Take 500-1,000 mg by mouth every 8 (eight) hours as  needed for mild pain, moderate pain or headache.  12/10/14  Yes Historical Provider, MD  albuterol (PROAIR HFA) 108 (90 Base) MCG/ACT inhaler Inhale 2 puffs into the lungs every 6 (six) hours as needed for wheezing or shortness of breath.  12/11/14  Yes Historical Provider, MD  aspirin EC 81 MG tablet Take 81 mg by mouth daily. 12/10/14  Yes Historical Provider, MD  atorvastatin (LIPITOR) 40 MG tablet Take 40 mg by mouth at bedtime. 02/21/15  Yes Historical Provider, MD  budesonide-formoterol (SYMBICORT) 160-4.5 MCG/ACT inhaler Inhale 2 puffs into the lungs 2 (two) times daily. 03/27/15  Yes Historical Provider, MD  carvedilol (COREG) 6.25 MG tablet Take 18.75 mg by mouth 2 (two) times daily with a meal.    Yes Historical Provider, MD  lisinopril (PRINIVIL,ZESTRIL) 5 MG tablet Take 5 mg by mouth daily. 04/29/15  Yes Historical Provider, MD  methimazole (TAPAZOLE) 5 MG tablet Take 15 mg by mouth every evening. 12/17/14  Yes Historical Provider, MD  ranitidine (ZANTAC) 150 MG tablet Take 150 mg by mouth 2 (two) times daily. 12/10/14  Yes Historical Provider, MD  senna-docusate (SENOKOT-S) 8.6-50 MG tablet Take 1 tablet by mouth daily.    Yes Historical Provider, MD  traMADol (ULTRAM) 50 MG tablet Take 50 mg by mouth every 6 (six) hours as needed for moderate pain.  06/26/15  Yes Historical Provider, MD  warfarin (COUMADIN) 5 MG tablet Take 5-7.5 mg by mouth See admin instructions. 5 mg in the evening on Sun/Tues/Thurs/Sat and 7.5 mg on Mon/Wed/Fri   Yes Historical Provider, MD  traMADol-acetaminophen (ULTRACET) 37.5-325 MG tablet Take 1 tablet by mouth every 6 (six) hours as needed. Patient not taking: Reported on 08/19/2015 08/06/15   Janne Napoleon, NP    Physical Exam:   Filed Vitals:   08/19/15 1630 08/19/15 1645 08/19/15 1745 08/19/15 1845  BP: 123/76 140/84 129/78 107/77  Pulse: 97 100 101   Temp:      TempSrc:      Resp: 21 14 33 25  SpO2: 91% 93% 91%      Physical Exam: Blood pressure  107/77, pulse 101, temperature 98.6 F (37 C), temperature source Oral, resp. rate 25, SpO2 91 %. Gen: No acute distress. Head: Normocephalic, atraumatic. Eyes: Pupils equal, round and reactive to light. Extraocular movements intact.  Sclerae nonicteric. Mouth: Oropharynx reveals dentures to the upper palate, moist mucous membranes. Neck: Supple, no thyromegaly, no lymphadenopathy, no jugular venous distention. Chest: Lungs are clear to auscultation with good air movement. No rales, rhonchi or wheezes.  CV: Heart sounds are regular with an S1, S2. No murmurs, rubs, clicks, or gallops.  Abdomen: Soft, nontender, nondistended with normal active bowel sounds. Extremities: Extremities are without clubbing, edema, or cyanosis. Pedal pulses 2+.  Skin: Warm and dry. No rashes, lesions or wounds. Neuro: Alert and oriented times 3; grossly nonfocal.  Psych: Insight is good and judgment is appropriate. Mood and affect normal.  Data Review:    Labs: Basic Metabolic Panel:  Recent Labs Lab 08/19/15 1642  NA 140  K 4.8  CL 110  CO2 23  GLUCOSE 105*  BUN 17  CREATININE 1.09*  CALCIUM 10.0   CBC:  Recent Labs Lab 08/19/15 1642  WBC 9.6  HGB 12.4  HCT 38.5  MCV 93.7  PLT 128*     Radiographic Studies: Ct Hip Left Wo Contrast  08/19/2015  CLINICAL DATA:  Posterior lateral left hip pain status post fall EXAM: CT OF THE LEFT HIP WITHOUT CONTRAST TECHNIQUE: Multidetector CT imaging of the left hip was performed according to the standard protocol. Multiplanar CT image reconstructions were also generated. COMPARISON:  None. FINDINGS: Bones/Joint/Cartilage Nondisplaced fracture of the left sacral ala. Nondisplaced fracture of the left superior pubic ramus at the acetabular junction. Minimally displaced fracture of the left inferior pubic ramus. No left hip fracture or dislocation. Joint space narrowing of the left hip with marginal osteophytosis consistent with mild-moderate  osteoarthritis. Normal alignment. No aggressive lytic or sclerotic osseous lesion. Degenerative disc disease with disc height loss at L4-5. Muscles Normal. Soft tissue No fluid collection or hematoma.  No soft tissue mass. IMPRESSION: 1. Nondisplaced fracture of the left superior pubic ramus at the acetabular junction. Minimally displaced fracture of the left inferior pubic ramus. 2. Nondisplaced fracture of the left sacral ala. Electronically Signed   By: Kathreen Devoid   On: 08/19/2015 16:23   Dg Hip Unilat With Pelvis 2-3 Views Left  08/19/2015  CLINICAL DATA:  Posterior and lateral left hip pain status post fall. EXAM: DG HIP (WITH OR WITHOUT PELVIS) 2-3V LEFT COMPARISON:  None. FINDINGS: There is no evidence of displaced left hip fracture, however the left hip is abnormally internally rotated. Curvilinear line is seen extending transversely through the acetabulum, raising the question about nondisplaced or minimally displaced acetabular fracture. Soft tissues are grossly normal. IMPRESSION: Abnormal rotation of the left hip with curvilinear line extending through the acetabulum. Query nondisplaced or minimally displaces fracture. Confirmation with cross-sectional imaging may be considered. Electronically Signed   By: Fidela Salisbury M.D.   On: 08/19/2015 13:53    EKG: No EKG was ordered. Will order on admission.   Assessment/Plan:   Principal Problem:   Pelvic fracture ([place]) secondary to fall at home We'll provide pain control with Tylenol for mild pain, Ultram for moderate pain, and morphine as needed for severe pain. Will order an ice pack to use as needed for adjunctive pain control. Dr. Doran Durand from orthopedic surgery will evaluate. Weightbearing as tolerated. PT evaluation.  Active Problems:   Hypertension Continue home dose of lisinopril, Coreg and monitor.    Hyperlipidemia Continue home dose of atorvastatin.    Asthma Continue Symbicort and albuterol as needed.    Atrial  fibrillation (HCC) Check 12-lead EKG. Continue Coreg and Coumadin.    Chronic systolic CHF (congestive heart failure) (HCC) EF 30% on last echo. Monitor I/O and volume status closely. Currently compensated.    Coronary artery disease Continue aspirin, statin, and beta blocker.    Hyperthyroidism Continue Tapazole.    Aortic stenosis status post aortic valve replacement with bioprosthetic valve   Other information:   DVT prophylaxis: Coumadin ordered. Code Status: Full code. Family Communication: Husband is emergency contact.  Not present.  Disposition Plan: Home versus SNF for rehabilitation. Consults called: Dr. Doran Durand Admission status: Observation.  Time spent: One hour.  Danielle [name] [facility] Pager [phone] Cell: [phone]   If 7PM-7AM, please contact  night-coverage [email or web address] Password [facility] 08/19/2015, 6:54 PM

## 2015-08-19 NOTE — Progress Notes (Signed)
ANTICOAGULATION CONSULT NOTE - Initial Consult  Pharmacy Consult for warfarin Indication: atrial fibrillation  Allergies  Allergen Reactions  . Tetanus Toxoid Adsorbed Swelling    Unknown     Patient Measurements:     Vital Signs: Temp: 98.6 F (37 C) (06/20 1301) Temp Source: Oral (06/20 1301) BP: 120/97 mmHg (06/20 1930) Pulse Rate: 43 (06/20 1930)  Labs:  Recent Labs  08/19/15 1642  HGB 12.4  HCT 38.5  PLT 128*  LABPROT 28.2*  INR 2.69*  CREATININE 1.09*    CrCl cannot be calculated (Unknown ideal weight.).  Assessment: 66 YOF with history of AFib as well as bioprosthetic aortic valve replacement on warfarin PTA, admitted 6/20 after a fall resulting in pelvic fracture. Ortho to evaluate patient, but H&P states there is indication management will be nonsurgical.  Admission INR therapeutic at 2.69 (goal 2-3- confirmed with notes from [facility] in [facility]). Patient's home dose of warfarin is 5mg  daily except 7.5mg  on MWF, last dose was taken yesterday evening.  Hgb 12.4, plts 128- no bleeding noted.  Goal of Therapy:  INR 2-3 Monitor platelets by anticoagulation protocol: Yes   Plan:  -continue patient's home warfarin dose of 5mg  daily except 7.5mg  on MWF -daily INR -follow CBC, s/s bleeding -follow for any changes to ortho's plan  Birgit Nowling D. Jennessa Trigo, PharmD, BCPS Clinical Pharmacist Pager: [phone] 08/19/2015 8:42 PM

## 2015-08-19 NOTE — ED Notes (Signed)
Pt transported to CT ?

## 2015-08-19 NOTE — ED Notes (Signed)
Report attempted x2. Number given to call back.

## 2015-08-19 NOTE — ED Notes (Signed)
Per pt, she was reaching for something in the cabinet and lost her balance. Pt denies hitting head, denies LOC. Pt fell backward and hit L hip. Pt on coumadin.

## 2015-08-19 NOTE — ED Notes (Signed)
Report attempted nurse states call back in 10 minutes.

## 2015-08-20 DIAGNOSIS — I10 Essential (primary) hypertension: Secondary | ICD-10-CM

## 2015-08-20 DIAGNOSIS — S32811A Multiple fractures of pelvis with unstable disruption of pelvic ring, initial encounter for closed fracture: Secondary | ICD-10-CM | POA: Diagnosis not present

## 2015-08-20 DIAGNOSIS — I5022 Chronic systolic (congestive) heart failure: Secondary | ICD-10-CM

## 2015-08-20 DIAGNOSIS — I480 Paroxysmal atrial fibrillation: Secondary | ICD-10-CM

## 2015-08-20 LAB — URINALYSIS, ROUTINE W REFLEX MICROSCOPIC
BILIRUBIN URINE: NEGATIVE
Glucose, UA: NEGATIVE mg/dL
Hgb urine dipstick: NEGATIVE
KETONES UR: NEGATIVE mg/dL
LEUKOCYTES UA: NEGATIVE
NITRITE: NEGATIVE
PROTEIN: NEGATIVE mg/dL
Specific Gravity, Urine: 1.025 (ref 1.005–1.030)
pH: 5.5 (ref 5.0–8.0)

## 2015-08-20 LAB — PROTIME-INR
INR: 2.95 — AB (ref 0.00–1.49)
PROTHROMBIN TIME: 30.2 s — AB (ref 11.6–15.2)

## 2015-08-20 MED ORDER — WARFARIN SODIUM 7.5 MG PO TABS: 7.5000 mg | ORAL_TABLET | ORAL | Status: DC

## 2015-08-20 MED ORDER — TRAMADOL HCL 50 MG PO TABS: 50.0000 mg | ORAL_TABLET | Freq: Four times a day (QID) | ORAL | Status: DC | PRN

## 2015-08-20 MED ORDER — VALACYCLOVIR HCL 1 G PO TABS: 1000.0000 mg | ORAL_TABLET | Freq: Two times a day (BID) | ORAL | Status: DC

## 2015-08-20 MED ORDER — VALACYCLOVIR HCL 500 MG PO TABS
1000.0000 mg | ORAL_TABLET | Freq: Two times a day (BID) | ORAL | Status: DC
  Administered 2015-08-20 – 2015-08-21 (×3): 1000 mg via ORAL
  Filled 2015-08-20 (×3): qty 2

## 2015-08-20 MED ORDER — WARFARIN SODIUM 5 MG PO TABS
5.0000 mg | ORAL_TABLET | Freq: Once | ORAL | Status: AC
  Administered 2015-08-20: 5 mg via ORAL
  Filled 2015-08-20: qty 1

## 2015-08-20 NOTE — Progress Notes (Signed)
PROGRESS NOTE                                                                                                                                                                                                             Patient Demographics:    Danielle [name], is a 70 y.o. female, DOB - 1945/11/13, [id number]  Admit date - 08/19/2015   Admitting Physician Venetia Maxon Rama, MD  Outpatient Primary MD for the patient is Maximino Greenland, MD  LOS -    Chief Complaint  Patient presents with  . Fall  . Hip Pain       Brief Narrative   Estimated-year-old female with past medical history of asthma, hypertension, paroxysmal fibrillation on warfarin, aortic stenosis status post fall replacement with bioprosthetic valve, chronic CHF, EF 30%, hyperlipidemia, presents with mechanical fall, pelvic fracture, seen by orthopedic, recommendation is for nonsurgical management.   Subjective:    [name] today has no headache,no chest or abdominal pain, had PT today, complaining of left pelvic pain .   Assessment  & Plan :    Principal Problem:   Pelvic fracture ([place]) Active Problems:   Fall at home   Hypertension   Hyperlipidemia   Asthma   Atrial fibrillation (HCC)   Chronic systolic CHF (congestive heart failure) ([place])   Coronary artery disease   Aortic stenosis   Hyperthyroidism   Pelvic fracture ([place]) secondary to fall at home - Patient seen by orthopedic doctor who, recommendation is for nonsurgical management, has been seen by PT, continue with pain management . - Check urinalysis  Hypertension - Has been on the lower side during hospital stay secondary to IV pain meds, will hold lisinopril , continue with Coreg   Hyperlipidemia - Continue home dose of atorvastatin.  Asthma - Continue Symbicort and albuterol as needed.  Atrial fibrillation ([place]) - Heart rate controlled, Continue Coreg and  Coumadin.  Chronic systolic CHF (congestive heart failure) (HCC) - EF 30% on last echo. Monitor I/O and volume status closely. Currently compensated.  Coronary artery disease - Continue aspirin, statin, and beta blocker.  Hyperthyroidism - Continue Tapazole.  Genital herpes - We'll treat for 1 week of Valtrex   Aortic stenosis status post aortic valve replacement with bioprosthetic valve   Code Status : Full  Family Communication  : None at bedside  Disposition Plan  : home in am  Consults  :  ortho  Procedures  : none  DVT Prophylaxis  :  on warfarin  Lab Results  Component Value Date   PLT 128* 08/19/2015    Antibiotics  :    Anti-infectives    Start     Dose/Rate Route Frequency Ordered Stop   08/20/15 1515  valACYclovir (VALTREX) tablet 1,000 mg     1,000 mg Oral 2 times daily 08/20/15 1501     08/20/15 0000  valACYclovir (VALTREX) 1000 MG tablet  Status:  Discontinued     1,000 mg Oral 2 times daily 08/20/15 1501 08/20/15    08/20/15 0000  valACYclovir (VALTREX) 1000 MG tablet     1,000 mg Oral 2 times daily 08/20/15 1502          Objective:   Filed Vitals:   08/20/15 0628 08/20/15 0803 08/20/15 1312 08/20/15 1447  BP: 97/68  101/61 121/53  Pulse: 95  74 99  Temp: 99.2 F (37.3 C)   99.3 F (37.4 C)  TempSrc: Oral     Resp: 18  18 16   Height:      Weight:      SpO2: 98% 93%  96%    Wt Readings from Last 3 Encounters:  08/19/15 93.441 kg (206 lb)     Intake/Output Summary (Last 24 hours) at 08/20/15 1702 Last data filed at 08/20/15 1641  Gross per 24 hour  Intake    490 ml  Output      0 ml  Net    490 ml     Physical Exam  Awake Alert, Oriented X 3, No new F.N deficits, Normal affect [place].AT,PERRAL Supple Neck,No JVD, No cervical lymphadenopathy appriciated.  Symmetrical Chest wall movement, Good air movement bilaterally, CTAB RRR,No Gallops,Rubs or new Murmurs, No Parasternal Heave +ve B.Sounds, Abd Soft, No tenderness, No  organomegaly appriciated, No rebound - guarding or rigidity. No Cyanosis, Clubbing or edema, No new Rash or bruise      Data Review:    CBC  Recent Labs Lab 08/19/15 1642  WBC 9.6  HGB 12.4  HCT 38.5  PLT 128*  MCV 93.7  MCH 30.2  MCHC 32.2  RDW 13.2    Chemistries   Recent Labs Lab 08/19/15 1642  NA 140  K 4.8  CL 110  CO2 23  GLUCOSE 105*  BUN 17  CREATININE 1.09*  CALCIUM 10.0   ------------------------------------------------------------------------------------------------------------------ No results for input(s): CHOL, HDL, LDLCALC, TRIG, CHOLHDL, LDLDIRECT in the last 72 hours.  No results found for: HGBA1C ------------------------------------------------------------------------------------------------------------------ No results for input(s): TSH, T4TOTAL, T3FREE, THYROIDAB in the last 72 hours.  Invalid input(s): FREET3 ------------------------------------------------------------------------------------------------------------------ No results for input(s): VITAMINB12, FOLATE, FERRITIN, TIBC, IRON, RETICCTPCT in the last 72 hours.  Coagulation profile  Recent Labs Lab 08/19/15 1642 08/20/15 0540  INR 2.69* 2.95*    No results for input(s): DDIMER in the last 72 hours.  Cardiac Enzymes No results for input(s): CKMB, TROPONINI, MYOGLOBIN in the last 168 hours.  Invalid input(s): CK ------------------------------------------------------------------------------------------------------------------ No results found for: BNP  Inpatient Medications  Scheduled Meds: . aspirin EC  81 mg Oral Daily  . atorvastatin  40 mg Oral QHS  . carvedilol  18.75 mg Oral BID WC  . famotidine  20 mg Oral Daily  . lisinopril  5 mg Oral Daily  . methimazole  15 mg Oral QPM  . mometasone-formoterol  2 puff Inhalation BID  . senna-docusate  1 tablet  Oral Daily  . sodium chloride flush  3 mL Intravenous Q12H  . valACYclovir  1,000 mg Oral BID  . warfarin  5  mg Oral Once per day on Sun Tue Thu Sat  . warfarin  5 mg Oral ONCE-1800  . [START ON 08/22/2015] warfarin  7.5 mg Oral Once per day on Mon Wed Fri  . Warfarin - Pharmacist Dosing Inpatient   Does not apply q1800   Continuous Infusions:  PRN Meds:.sodium chloride, acetaminophen, albuterol, magnesium citrate, morphine injection, ondansetron **OR** ondansetron (ZOFRAN) IV, sodium chloride flush, sorbitol, traMADol  Micro Results No results found for this or any previous visit (from the past 240 hour(s)).  Radiology Reports Ct Hip Left Wo Contrast  08/19/2015  CLINICAL DATA:  Posterior lateral left hip pain status post fall EXAM: CT OF THE LEFT HIP WITHOUT CONTRAST TECHNIQUE: Multidetector CT imaging of the left hip was performed according to the standard protocol. Multiplanar CT image reconstructions were also generated. COMPARISON:  None. FINDINGS: Bones/Joint/Cartilage Nondisplaced fracture of the left sacral ala. Nondisplaced fracture of the left superior pubic ramus at the acetabular junction. Minimally displaced fracture of the left inferior pubic ramus. No left hip fracture or dislocation. Joint space narrowing of the left hip with marginal osteophytosis consistent with mild-moderate osteoarthritis. Normal alignment. No aggressive lytic or sclerotic osseous lesion. Degenerative disc disease with disc height loss at L4-5. Muscles Normal. Soft tissue No fluid collection or hematoma.  No soft tissue mass. IMPRESSION: 1. Nondisplaced fracture of the left superior pubic ramus at the acetabular junction. Minimally displaced fracture of the left inferior pubic ramus. 2. Nondisplaced fracture of the left sacral ala. Electronically Signed   By: Kathreen Devoid   On: 08/19/2015 16:23   Dg Hip Unilat With Pelvis 2-3 Views Left  08/19/2015  CLINICAL DATA:  Posterior and lateral left hip pain status post fall. EXAM: DG HIP (WITH OR WITHOUT PELVIS) 2-3V LEFT COMPARISON:  None. FINDINGS: There is no evidence of  displaced left hip fracture, however the left hip is abnormally internally rotated. Curvilinear line is seen extending transversely through the acetabulum, raising the question about nondisplaced or minimally displaced acetabular fracture. Soft tissues are grossly normal. IMPRESSION: Abnormal rotation of the left hip with curvilinear line extending through the acetabulum. Query nondisplaced or minimally displaces fracture. Confirmation with cross-sectional imaging may be considered. Electronically Signed   By: Fidela Salisbury M.D.   On: 08/19/2015 13:53    Time Spent in minutes  25 minutes   Dann Galicia M.D on 08/20/2015 at 5:02 PM  Between 7am to 7pm - Pager - [phone]  After 7pm go to [email or web address] - password [facility]  [facility] -  Office  [phone]

## 2015-08-20 NOTE — Discharge Instructions (Signed)
Follow with Primary MD Maximino Greenland, MD in 7 days   Get CBC, CMP,  checked  by Primary MD next visit.    Activity: As tolerated with Full fall precautions use walker/cane & assistance as needed   Disposition Home    Diet: Heart Healthy  , with feeding assistance and aspiration precautions.  For Heart failure patients - Check your Weight same time everyday, if you gain over 2 pounds, or you develop in leg swelling, experience more shortness of breath or chest pain, call your Primary MD immediately. Follow Cardiac Low Salt Diet and 1.5 lit/day fluid restriction.   On your next visit with your primary care physician please Get Medicines reviewed and adjusted.   Please request your Prim.MD to go over all Hospital Tests and Procedure/Radiological results at the follow up, please get all Hospital records sent to your Prim MD by signing hospital release before you go home.   If you experience worsening of your admission symptoms, develop shortness of breath, life threatening emergency, suicidal or homicidal thoughts you must seek medical attention immediately by calling 911 or calling your MD immediately  if symptoms less severe.  You Must read complete instructions/literature along with all the possible adverse reactions/side effects for all the Medicines you take and that have been prescribed to you. Take any new Medicines after you have completely understood and accpet all the possible adverse reactions/side effects.   Do not drive, operating heavy machinery, perform activities at heights, swimming or participation in water activities or provide baby sitting services if your were admitted for syncope or siezures until you have seen by Primary MD or a Neurologist and advised to do so again.  Do not drive when taking Pain medications.    Do not take more than prescribed Pain, Sleep and Anxiety Medications  Special Instructions: If you have smoked or chewed Tobacco  in the last 2 yrs  please stop smoking, stop any regular Alcohol  and or any Recreational drug use.  Wear Seat belts while driving.   Please note  You were cared for by a hospitalist during your hospital stay. If you have any questions about your discharge medications or the care you received while you were in the hospital after you are discharged, you can call the unit and asked to speak with the hospitalist on call if the hospitalist that took care of you is not available. Once you are discharged, your primary care physician will handle any further medical issues. Please note that NO REFILLS for any discharge medications will be authorized once you are discharged, as it is imperative that you return to your primary care physician (or establish a relationship with a primary care physician if you do not have one) for your aftercare needs so that they can reassess your need for medications and monitor your lab values.

## 2015-08-20 NOTE — Progress Notes (Signed)
[place] for warfarin Indication: atrial fibrillation  Allergies  Allergen Reactions  . Tetanus Toxoid Adsorbed Swelling    Unknown     Patient Measurements: Height: 5\' 7"  (170.2 cm) Weight: 206 lb (93.441 kg) IBW/kg (Calculated) : 61.6   Vital Signs: Temp: 99.2 F (37.3 C) (06/21 0628) Temp Source: Oral (06/21 0628) BP: 97/68 mmHg (06/21 0628) Pulse Rate: 95 (06/21 0628)  Labs:  Recent Labs  08/19/15 1642 08/20/15 0540  HGB 12.4  --   HCT 38.5  --   PLT 128*  --   LABPROT 28.2* 30.2*  INR 2.69* 2.95*  CREATININE 1.09*  --     Estimated Creatinine Clearance: 57.1 mL/min (by C-G formula based on Cr of 1.09).  Assessment: 81 YOF with history of AFib as well as bioprosthetic aortic valve replacement on warfarin PTA, admitted 6/20 after a fall resulting in pelvic fracture. Ortho management will be nonsurgical.  Admission INR therapeutic at 2.69 (goal 2-3- confirmed with notes from [facility] in [facility]). Patient's home dose of warfarin is 5mg  daily except 7.5mg  on MWF, last dose was taken 6/19. INR up to 2.95 today.  Hgb 12.4, plts 128- no bleeding noted.  Goal of Therapy:  INR 2-3 Monitor platelets by anticoagulation protocol: Yes   Plan:  -give warfarin 5mg  po x1 tonight, then continue patient's home warfarin dose of 5mg  daily except 7.5mg  on MWF -change INR checks to MWF only -follow CBC, s/s bleeding  Gad Aymond D. Selwyn Reason, PharmD, BCPS Clinical Pharmacist Pager: [phone] 08/20/2015 10:44 AM

## 2015-08-20 NOTE — Evaluation (Signed)
Physical Therapy Evaluation Patient Details Name: Danielle [name] MRN: [id number] DOB: 26-Dec-1945 Today's Date: 08/20/2015   History of Present Illness  pt presents after a ground level fall sustaining L Superior Pubic rami fx at Acetabular junction, L minimally displaced Inferior Pubic Rami fx, and L Sacral Ala fx.  pt with hx of HTN, CVA, CAD, CHF, and Valve Replacement.    Clinical Impression  Pt at this time requiring extensive A for all mobility due to pain in L hip/pelvis.  Pt needs max cueing and encouragement as she tends to lay her head on PT and lean on PT.  Pt indicates plan is for D/C to home, however pt needs better pain control/tolerance to allow for safe mobility and safe D/C to home.      Follow Up Recommendations Home health PT;Supervision/Assistance - 24 hour    Equipment Recommendations  3in1 (PT);Wheelchair (measurements PT);Wheelchair cushion (measurements PT) Lobbyist)    Recommendations for Other Services       Precautions / Restrictions Precautions Precautions: Fall Restrictions Weight Bearing Restrictions: Yes LLE Weight Bearing: Touchdown weight bearing      Mobility  Bed Mobility Overal bed mobility: Needs Assistance;+2 for physical assistance Bed Mobility: Supine to Sit     Supine to sit: Max assist;+2 for physical assistance;HOB elevated     General bed mobility comments: pt very painful with minimal movement.  HOB elevated and pt utilized bed rails.  Pad under hips used to bring pt to EOB.  Transfers Overall transfer level: Needs assistance Equipment used: Rolling walker (2 wheeled) Transfers: Sit to/from [facility] Sit to Stand: Mod assist;+2 physical assistance Stand pivot transfers: Mod assist;+2 physical assistance       General transfer comment: pt needs max cueing and encouragement for transfer as she tends to lay her head on PT and then lean on PT.  Max cueing for UE use on RW, but seems to have  limited effort with RW use.  pt able to "wiggle" on R foot to A with pivot towards R to recliner.  Ambulation/Gait                Stairs            Wheelchair Mobility    Modified Rankin (Stroke Patients Only)       Balance Overall balance assessment: Needs assistance Sitting-balance support: Bilateral upper extremity supported;Feet supported Sitting balance-Leahy Scale: Fair     Standing balance support: Bilateral upper extremity supported;During functional activity Standing balance-Leahy Scale: Poor                               Pertinent Vitals/Pain Pain Assessment: 0-10 Pain Score: 10-Worst pain ever Pain Location: L hip Pain Descriptors / Indicators: Sore;Sharp;Grimacing;Guarding Pain Intervention(s): Limited activity within patient's tolerance;Monitored during session;Premedicated before session;Repositioned    Home Living Family/patient expects to be discharged to:: Private residence Living Arrangements: Spouse/significant other Available Help at Discharge: Family;Available 24 hours/day Type of Home: House Home Access: Level entry     Home Layout: One level Home Equipment: Walker - 2 wheels;Cane - single point Additional Comments: pt indicates she just gave away her 3-in-1 and shower seat.    Prior Function Level of Independence: Independent               Hand Dominance        Extremity/Trunk Assessment   Upper Extremity Assessment: Overall WFL for tasks assessed  Lower Extremity Assessment: Generalized weakness;LLE deficits/detail   LLE Deficits / Details: Unable to fully assess due to pain.  pt with minimal active movement due to pain.  Sensation intact.  Cervical / Trunk Assessment: Normal  Communication   Communication: No difficulties  Cognition Arousal/Alertness: Awake/alert Behavior During Therapy: WFL for tasks assessed/performed Overall Cognitive Status: Within Functional Limits for tasks  assessed                      General Comments      Exercises        Assessment/Plan    PT Assessment Patient needs continued PT services  PT Diagnosis Difficulty walking;Generalized weakness;Acute pain   PT Problem List Decreased strength;Decreased activity tolerance;Decreased balance;Decreased mobility;Decreased knowledge of use of DME;Obesity;Pain  PT Treatment Interventions DME instruction;Gait training;Functional mobility training;Therapeutic activities;Therapeutic exercise;Balance training;Patient/family education   PT Goals (Current goals can be found in the Care Plan section) Acute Rehab PT Goals Patient Stated Goal: Heal up and walk PT Goal Formulation: With patient Time For Goal Achievement: 09/03/15 Potential to Achieve Goals: Good    Frequency Min 5X/week   Barriers to discharge        Co-evaluation               End of Session Equipment Utilized During Treatment: Gait belt Activity Tolerance: Patient limited by pain Patient left: in chair;with call bell/phone within reach Nurse Communication: Mobility status;Need for lift equipment (Nsg tech aware of need for lift)    Functional Assessment Tool Used: Clinical Judgement Functional Limitation: Mobility: Walking and moving around Mobility: Walking and Moving Around Current Status [phone]): At least 60 percent but less than 80 percent impaired, limited or restricted Mobility: Walking and Moving Around Goal Status [phone]): At least 1 percent but less than 20 percent impaired, limited or restricted    Time: 1049-1109 PT Time Calculation (min) (ACUTE ONLY): 20 min   Charges:   PT Evaluation $PT Eval Moderate Complexity: 1 Procedure     PT G Codes:   PT G-Codes **NOT FOR INPATIENT CLASS** Functional Assessment Tool Used: Clinical Judgement Functional Limitation: Mobility: Walking and moving around Mobility: Walking and Moving Around Current Status [id number]): At least 60 percent but less than 80  percent impaired, limited or restricted Mobility: Walking and Moving Around Goal Status [phone]): At least 1 percent but less than 20 percent impaired, limited or restricted    Catarina Hartshorn, [place] 08/20/2015, 11:51 AM

## 2015-08-20 NOTE — Discharge Summary (Deleted)
Danielle [name], is a 70 y.o. female  DOB 1946-01-01  MRN [id number].  Admission date:  08/19/2015  Admitting Physician  Venetia Maxon Rama, MD  Discharge Date:  08/20/2015   Primary MD  Maximino Greenland, MD  Recommendations for primary care physician for things to follow:  - please check CBC, BMP during next visit. - Follow with orthopedic in 2 weeks.   Admission Diagnosis  Aortic stenosis 123XX123 Chronic systolic CHF (congestive heart failure) ([place]) [I50.22] Multiple closed fractures of pelvis with unstable disruption of pelvic circle, initial encounter ([place]) [S32.811A] Coronary artery disease due to lipid rich plaque [I25.10] Closed fracture of sacrum, unspecified fracture morphology, initial encounter ([place]) [S32.10XA]   Discharge Diagnosis  Aortic stenosis 123XX123 Chronic systolic CHF (congestive heart failure) ([place]) [I50.22] Multiple closed fractures of pelvis with unstable disruption of pelvic circle, initial encounter ([place]) [S32.811A] Coronary artery disease due to lipid rich plaque [I25.10] Closed fracture of sacrum, unspecified fracture morphology, initial encounter ([place]) [S32.10XA]    Principal Problem:   Pelvic fracture (HCC) Active Problems:   Fall at home   Hypertension   Hyperlipidemia   Asthma   Atrial fibrillation (HCC)   Chronic systolic CHF (congestive heart failure) ([place])   Coronary artery disease   Aortic stenosis   Hyperthyroidism      Past Medical History  Diagnosis Date  . Hypertension   . Asthma   . Hyperlipidemia   . Aortic stenosis     Status post bioprosthetic AVR  . Coronary artery disease   . Stroke ([place])   . Multiple thyroid nodules   . Hyperthyroidism   . Gastritis   . Chronic systolic CHF (congestive heart failure) (HCC)     EF 30% 04/2014    Past Surgical History  Procedure Laterality Date  . Valve replacement         History of  present illness and  Hospital Course:     Kindly see H&P for history of present illness and admission details, please review complete Labs, Consult reports and Test reports for all details in brief  HPI  from the history and physical done on the day of admission 08/19/2015  Danielle [name] is an 70 y.o. female with a PMH of stable asthma (uses an inhaler twice a day), hypertension, paroxysmal atrial fibrillation on chronic Coumadin, aortic stenosis status post aortic valve replacement with a bioprosthetic valve, chronic systolic CHF with an EF of 30% per echo done in March 2016, and hyperlipidemia who suffered from a mechanical fall when she lost her footing earlier today. She was reaching for an item in her pantry and lost her balance. She did not have any symptoms of dizziness or presyncope prior to the fall, and did not lose consciousness. There was no head trauma. She fell onto her buttocks and immediately had pain in her tailbone and left hip. She attempted to ambulate with the assistance of a family member, but experienced severe pain. She is brought to the hospital for further evaluation after a heating pad and  Aspercreme did not help her pain. Movement exacerbates the pain and lying still eases it off.  ED Course: The patient was given IV morphine and Dilaudid for pain control. Plain films showed a possible nondisplaced acetabular fracture. CT of the left hip was subsequently performed and showed a nondisplaced fracture of the left superior pubic ramus at the acetabular junction and a minimally displaced fracture of the left inferior pubic ramus as well as a nondisplaced fracture of the left sacral ala. Dr. Doran Durand was consulted by the EDP and will evaluate the patient, but has indicated that management will be nonoperative with pain control and weightbearing as tolerated.  Hospital Course  Pelvic fracture ([place]) secondary to fall at home Patient seen by orthopedic doctor who,  recommendation is for nonsurgical management, has been seen by PT, will be discharged home with home care, and use tramadol for pain control, he be seen by orthopedic as an outpatient  Hypertension - Has been on the lower side during hospital stay secondary to IV pain meds, will hold lisinopril on discharge, continue with Coreg   Hyperlipidemia - Continue home dose of atorvastatin.  Asthma - Continue Symbicort and albuterol as needed.  Atrial fibrillation ([place]) - Heart rate controlled, Continue Coreg and Coumadin.  Chronic systolic CHF (congestive heart failure) (HCC) - EF 30% on last echo. Monitor I/O and volume status closely. Currently compensated.   Coronary artery disease - Continue aspirin, statin, and beta blocker.  Hyperthyroidism - Continue Tapazole.  Genital herpes - We'll treat for 1 week of Valtrex   Aortic stenosis status post aortic valve replacement with bioprosthetic valve     Discharge Condition:  stable   Follow UP  Follow-up Information    Follow up with SANDERS,ROBYN N, MD. Schedule an appointment as soon as possible for a visit in 1 week.   Specialty:  Internal Medicine   Contact information:   [address] [place] [address] [phone]       Follow up with Wylene Simmer, MD. Schedule an appointment as soon as possible for a visit in 2 weeks.   Specialty:  Orthopedic Surgery   Contact information:   [address] [place] [address] [address] [place] [address] [phone]         Discharge Instructions  and  Discharge Medications         Discharge Instructions    Discharge instructions    Complete by:  As directed   Follow with Primary MD Maximino Greenland, MD in 7 days   Get CBC, CMP,  checked  by Primary MD next visit.    Activity: As tolerated with Full fall precautions use walker/cane & assistance as needed   Disposition Home    Diet: Heart Healthy  , with feeding assistance and aspiration  precautions.  For Heart failure patients - Check your Weight same time everyday, if you gain over 2 pounds, or you develop in leg swelling, experience more shortness of breath or chest pain, call your Primary MD immediately. Follow Cardiac Low Salt Diet and 1.5 lit/day fluid restriction.   On your next visit with your primary care physician please Get Medicines reviewed and adjusted.   Please request your Prim.MD to go over all Hospital Tests and Procedure/Radiological results at the follow up, please get all Hospital records sent to your Prim MD by signing hospital release before you go home.   If you experience worsening of your admission symptoms, develop shortness of breath, life threatening emergency, suicidal or homicidal  thoughts you must seek medical attention immediately by calling 911 or calling your MD immediately  if symptoms less severe.  You Must read complete instructions/literature along with all the possible adverse reactions/side effects for all the Medicines you take and that have been prescribed to you. Take any new Medicines after you have completely understood and accpet all the possible adverse reactions/side effects.   Do not drive, operating heavy machinery, perform activities at heights, swimming or participation in water activities or provide baby sitting services if your were admitted for syncope or siezures until you have seen by Primary MD or a Neurologist and advised to do so again.  Do not drive when taking Pain medications.    Do not take more than prescribed Pain, Sleep and Anxiety Medications  Special Instructions: If you have smoked or chewed Tobacco  in the last 2 yrs please stop smoking, stop any regular Alcohol  and or any Recreational drug use.  Wear Seat belts while driving.   Please note  You were cared for by a hospitalist during your hospital stay. If you have any questions about your discharge medications or the care you received while you  were in the hospital after you are discharged, you can call the unit and asked to speak with the hospitalist on call if the hospitalist that took care of you is not available. Once you are discharged, your primary care physician will handle any further medical issues. Please note that NO REFILLS for any discharge medications will be authorized once you are discharged, as it is imperative that you return to your primary care physician (or establish a relationship with a primary care physician if you do not have one) for your aftercare needs so that they can reassess your need for medications and monitor your lab values.     Increase activity slowly    Complete by:  As directed             Medication List    STOP taking these medications        lisinopril 5 MG tablet  Commonly known as:  PRINIVIL,ZESTRIL     traMADol-acetaminophen 37.5-325 MG tablet  Commonly known as:  ULTRACET      TAKE these medications        acetaminophen 500 MG tablet  Commonly known as:  TYLENOL  Take 500-1,000 mg by mouth every 8 (eight) hours as needed for mild pain, moderate pain or headache.     aspirin EC 81 MG tablet  Take 81 mg by mouth daily.     carvedilol 6.25 MG tablet  Commonly known as:  COREG  Take 18.75 mg by mouth 2 (two) times daily with a meal.     LIPITOR 40 MG tablet  Generic drug:  atorvastatin  Take 40 mg by mouth at bedtime.     methimazole 5 MG tablet  Commonly known as:  TAPAZOLE  Take 15 mg by mouth every evening.     PROAIR HFA 108 (90 Base) MCG/ACT inhaler  Generic drug:  albuterol  Inhale 2 puffs into the lungs every 6 (six) hours as needed for wheezing or shortness of breath.     senna-docusate 8.6-50 MG tablet  Commonly known as:  Senokot-S  Take 1 tablet by mouth daily.     SYMBICORT 160-4.5 MCG/ACT inhaler  Generic drug:  budesonide-formoterol  Inhale 2 puffs into the lungs 2 (two) times daily.     traMADol 50 MG tablet  Commonly known as:  ULTRAM  Take 1  tablet (50 mg total) by mouth every 6 (six) hours as needed for moderate pain.     valACYclovir 1000 MG tablet  Commonly known as:  VALTREX  Take 1 tablet (1,000 mg total) by mouth 2 (two) times daily.     warfarin 5 MG tablet  Commonly known as:  COUMADIN  Take 5-7.5 mg by mouth See admin instructions. 5 mg in the evening on Sun/Tues/Thurs/Sat and 7.5 mg on Mon/Wed/Fri     ZANTAC 150 MG tablet  Generic drug:  ranitidine  Take 150 mg by mouth 2 (two) times daily.          Diet and Activity recommendation: See Discharge Instructions above   Consults obtained -  orthopedic   Major procedures and Radiology Reports - PLEASE review detailed and final reports for all details, in brief -    Ct Hip Left Wo Contrast  08/19/2015  CLINICAL DATA:  Posterior lateral left hip pain status post fall EXAM: CT OF THE LEFT HIP WITHOUT CONTRAST TECHNIQUE: Multidetector CT imaging of the left hip was performed according to the standard protocol. Multiplanar CT image reconstructions were also generated. COMPARISON:  None. FINDINGS: Bones/Joint/Cartilage Nondisplaced fracture of the left sacral ala. Nondisplaced fracture of the left superior pubic ramus at the acetabular junction. Minimally displaced fracture of the left inferior pubic ramus. No left hip fracture or dislocation. Joint space narrowing of the left hip with marginal osteophytosis consistent with mild-moderate osteoarthritis. Normal alignment. No aggressive lytic or sclerotic osseous lesion. Degenerative disc disease with disc height loss at L4-5. Muscles Normal. Soft tissue No fluid collection or hematoma.  No soft tissue mass. IMPRESSION: 1. Nondisplaced fracture of the left superior pubic ramus at the acetabular junction. Minimally displaced fracture of the left inferior pubic ramus. 2. Nondisplaced fracture of the left sacral ala. Electronically Signed   By: Kathreen Devoid   On: 08/19/2015 16:23   Dg Hip Unilat With Pelvis 2-3 Views  Left  08/19/2015  CLINICAL DATA:  Posterior and lateral left hip pain status post fall. EXAM: DG HIP (WITH OR WITHOUT PELVIS) 2-3V LEFT COMPARISON:  None. FINDINGS: There is no evidence of displaced left hip fracture, however the left hip is abnormally internally rotated. Curvilinear line is seen extending transversely through the acetabulum, raising the question about nondisplaced or minimally displaced acetabular fracture. Soft tissues are grossly normal. IMPRESSION: Abnormal rotation of the left hip with curvilinear line extending through the acetabulum. Query nondisplaced or minimally displaces fracture. Confirmation with cross-sectional imaging may be considered. Electronically Signed   By: Fidela Salisbury M.D.   On: 08/19/2015 13:53    Micro Results    No results found for this or any previous visit (from the past 240 hour(s)).     Today   Subjective:   Danielle [name] today has no headache,no chest or abdominal pain, had PT today, complaining of left pelvic pain . Objective:   Blood pressure 121/53, pulse 99, temperature 99.3 F (37.4 C), temperature source Oral, resp. rate 16, height 5\' 7"  (1.702 m), weight 93.441 kg (206 lb), SpO2 96 %.   Intake/Output Summary (Last 24 hours) at 08/20/15 1502 Last data filed at 08/20/15 0800  Gross per 24 hour  Intake    480 ml  Output      0 ml  Net    480 ml    Exam Awake Alert, Oriented x 3,  Supple Neck,No JVD, No cervical lymphadenopathy appriciated.  Symmetrical Chest wall movement,  Good air movement bilaterally, CTAB RRR,No Gallops,Rubs or new Murmurs, No Parasternal Heave +ve B.Sounds, Abd Soft, Non tender, No organomegaly appriciated, No rebound -guarding or rigidity. No Cyanosis, Clubbing or edema, No new Rash or bruise  Data Review   CBC w Diff:  Lab Results  Component Value Date   WBC 9.6 08/19/2015   HGB 12.4 08/19/2015   HCT 38.5 08/19/2015   PLT 128* 08/19/2015    CMP:  Lab Results  Component  Value Date   NA 140 08/19/2015   K 4.8 08/19/2015   CL 110 08/19/2015   CO2 23 08/19/2015   BUN 17 08/19/2015   CREATININE 1.09* 08/19/2015  .   Total Time in preparing paper work, data evaluation and todays exam - 35 minutes  Shaneil Yazdi M.D on 08/20/2015 at 3:02 PM  [facility]   Office  [phone]

## 2015-08-20 NOTE — Care Management Note (Signed)
Case Management Note  Patient Details  Name: Shaelynn Burkemper MRN: [id number] Date of Birth: 03/30/1945  Subjective/Objective:   70 yr old female s/p fall with left pelvic fracture.           Action/Plan:  Case manager spoke with patient and son concerning home health and DME needs. Choice was offered. Referal was called to Lelan Pons, [name] Liaison. Patient states she has a rolling walker, 3in1 has been requested.    Expected Discharge Date:   08/20/15               Expected Discharge Plan:  [place]  In-House Referral:     Discharge planning Services  CM Consult  Post Acute Care Choice:  Durable Medical Equipment Choice offered to:  Patient, Adult Children  DME Arranged:  3-N-1 DME Agency:  [place] Arranged:  PT, Nurse's Aide [place] Agency:  [place]  Status of Service:  Completed, signed off  If discussed at [place] of Stay Meetings, dates discussed:    Additional Comments:  Ninfa Meeker, RN 08/20/2015, 3:27 PM

## 2015-08-20 NOTE — Discharge Summary (Deleted)
Danielle [name], is a 70 y.o. female  DOB 05-29-1945  MRN [id number].  Admission date:  08/19/2015  Admitting Physician  Venetia Maxon Rama, MD  Discharge Date:  08/20/2015   Primary MD  Maximino Greenland, MD  Recommendations for primary care physician for things to follow:  - please check CBC, BMP during next visit. - Follow with orthopedic in 2 weeks.   Admission Diagnosis  Aortic stenosis 123XX123 Chronic systolic CHF (congestive heart failure) ([place]) [I50.22] Multiple closed fractures of pelvis with unstable disruption of pelvic circle, initial encounter ([place]) [S32.811A] Coronary artery disease due to lipid rich plaque [I25.10] Closed fracture of sacrum, unspecified fracture morphology, initial encounter ([place]) [S32.10XA]   Discharge Diagnosis  Aortic stenosis 123XX123 Chronic systolic CHF (congestive heart failure) ([place]) [I50.22] Multiple closed fractures of pelvis with unstable disruption of pelvic circle, initial encounter ([place]) [S32.811A] Coronary artery disease due to lipid rich plaque [I25.10] Closed fracture of sacrum, unspecified fracture morphology, initial encounter ([place]) [S32.10XA]    Principal Problem:   Pelvic fracture (HCC) Active Problems:   Fall at home   Hypertension   Hyperlipidemia   Asthma   Atrial fibrillation (HCC)   Chronic systolic CHF (congestive heart failure) ([place])   Coronary artery disease   Aortic stenosis   Hyperthyroidism      Past Medical History  Diagnosis Date  . Hypertension   . Asthma   . Hyperlipidemia   . Aortic stenosis     Status post bioprosthetic AVR  . Coronary artery disease   . Stroke ([place])   . Multiple thyroid nodules   . Hyperthyroidism   . Gastritis   . Chronic systolic CHF (congestive heart failure) (HCC)     EF 30% 04/2014    Past Surgical History  Procedure Laterality Date  . Valve replacement         History of  present illness and  Hospital Course:     Kindly see H&P for history of present illness and admission details, please review complete Labs, Consult reports and Test reports for all details in brief  HPI  from the history and physical done on the day of admission 08/19/2015  Danielle [name] is an 70 y.o. female with a PMH of stable asthma (uses an inhaler twice a day), hypertension, paroxysmal atrial fibrillation on chronic Coumadin, aortic stenosis status post aortic valve replacement with a bioprosthetic valve, chronic systolic CHF with an EF of 30% per echo done in March 2016, and hyperlipidemia who suffered from a mechanical fall when she lost her footing earlier today. She was reaching for an item in her pantry and lost her balance. She did not have any symptoms of dizziness or presyncope prior to the fall, and did not lose consciousness. There was no head trauma. She fell onto her buttocks and immediately had pain in her tailbone and left hip. She attempted to ambulate with the assistance of a family member, but experienced severe pain. She is brought to the hospital for further evaluation after a heating pad and  Aspercreme did not help her pain. Movement exacerbates the pain and lying still eases it off.  ED Course: The patient was given IV morphine and Dilaudid for pain control. Plain films showed a possible nondisplaced acetabular fracture. CT of the left hip was subsequently performed and showed a nondisplaced fracture of the left superior pubic ramus at the acetabular junction and a minimally displaced fracture of the left inferior pubic ramus as well as a nondisplaced fracture of the left sacral ala. Dr. Doran Durand was consulted by the EDP and will evaluate the patient, but has indicated that management will be nonoperative with pain control and weightbearing as tolerated.  Hospital Course  Pelvic fracture ([place]) secondary to fall at home Patient seen by orthopedic doctor who,  recommendation is for nonsurgical management, has been seen by PT, will be discharged home with home care, and use tramadol for pain control, he be seen by orthopedic as an outpatient  Hypertension - Has been on the lower side during hospital stay secondary to IV pain meds, will hold lisinopril on discharge, continue with Coreg   Hyperlipidemia - Continue home dose of atorvastatin.  Asthma - Continue Symbicort and albuterol as needed.  Atrial fibrillation ([place]) - Heart rate controlled, Continue Coreg and Coumadin.  Chronic systolic CHF (congestive heart failure) (HCC) - EF 30% on last echo. Monitor I/O and volume status closely. Currently compensated.   Coronary artery disease - Continue aspirin, statin, and beta blocker.  Hyperthyroidism - Continue Tapazole.   Aortic stenosis status post aortic valve replacement with bioprosthetic valve     Discharge Condition:  stable   Follow UP  Follow-up Information    Follow up with SANDERS,ROBYN N, MD. Schedule an appointment as soon as possible for a visit in 1 week.   Specialty:  Internal Medicine   Contact information:   [address] [place] [address] [phone]       Follow up with Wylene Simmer, MD. Schedule an appointment as soon as possible for a visit in 2 weeks.   Specialty:  Orthopedic Surgery   Contact information:   [address] [place] [address] [phone]         Discharge Instructions  and  Discharge Medications     Discharge Instructions    Discharge instructions    Complete by:  As directed   Follow with Primary MD Maximino Greenland, MD in 7 days   Get CBC, CMP,  checked  by Primary MD next visit.    Activity: As tolerated with Full fall precautions use walker/cane & assistance as needed   Disposition Home    Diet: Heart Healthy  , with feeding assistance and aspiration precautions.  For Heart failure patients - Check your Weight same time  everyday, if you gain over 2 pounds, or you develop in leg swelling, experience more shortness of breath or chest pain, call your Primary MD immediately. Follow Cardiac Low Salt Diet and 1.5 lit/day fluid restriction.   On your next visit with your primary care physician please Get Medicines reviewed and adjusted.   Please request your Prim.MD to go over all Hospital Tests and Procedure/Radiological results at the follow up, please get all Hospital records sent to your Prim MD by signing hospital release before you go home.   If you experience worsening of your admission symptoms, develop shortness of breath, life threatening emergency, suicidal or homicidal thoughts you must seek medical attention immediately by calling 911 or calling your MD immediately  if symptoms less severe.  You Must read complete instructions/literature along with all the possible adverse reactions/side effects for all the Medicines you take and that have been prescribed to you. Take any new Medicines after you have completely understood and accpet all the possible adverse reactions/side effects.   Do not drive, operating heavy machinery, perform activities at heights, swimming or participation in water activities or provide baby sitting services if your were admitted for syncope or siezures until you have seen by Primary MD or a Neurologist and advised to do so again.  Do not drive when taking Pain medications.    Do not take more than prescribed Pain, Sleep and Anxiety Medications  Special Instructions: If you have smoked or chewed Tobacco  in the last 2 yrs please stop smoking, stop any regular Alcohol  and or any Recreational drug use.  Wear Seat belts while driving.   Please note  You were cared for by a hospitalist during your hospital stay. If you have any questions about your discharge medications or the care you received while you were in the hospital after you are discharged, you can call the unit and  asked to speak with the hospitalist on call if the hospitalist that took care of you is not available. Once you are discharged, your primary care physician will handle any further medical issues. Please note that NO REFILLS for any discharge medications will be authorized once you are discharged, as it is imperative that you return to your primary care physician (or establish a relationship with a primary care physician if you do not have one) for your aftercare needs so that they can reassess your need for medications and monitor your lab values.     Increase activity slowly    Complete by:  As directed             Medication List    STOP taking these medications        traMADol-acetaminophen 37.5-325 MG tablet  Commonly known as:  ULTRACET      TAKE these medications        acetaminophen 500 MG tablet  Commonly known as:  TYLENOL  Take 500-1,000 mg by mouth every 8 (eight) hours as needed for mild pain, moderate pain or headache.     aspirin EC 81 MG tablet  Take 81 mg by mouth daily.     carvedilol 6.25 MG tablet  Commonly known as:  COREG  Take 18.75 mg by mouth 2 (two) times daily with a meal.     LIPITOR 40 MG tablet  Generic drug:  atorvastatin  Take 40 mg by mouth at bedtime.     lisinopril 5 MG tablet  Commonly known as:  PRINIVIL,ZESTRIL  Take 5 mg by mouth daily.     methimazole 5 MG tablet  Commonly known as:  TAPAZOLE  Take 15 mg by mouth every evening.     PROAIR HFA 108 (90 Base) MCG/ACT inhaler  Generic drug:  albuterol  Inhale 2 puffs into the lungs every 6 (six) hours as needed for wheezing or shortness of breath.     senna-docusate 8.6-50 MG tablet  Commonly known as:  Senokot-S  Take 1 tablet by mouth daily.     SYMBICORT 160-4.5 MCG/ACT inhaler  Generic drug:  budesonide-formoterol  Inhale 2 puffs into the lungs 2 (two) times daily.     traMADol 50 MG tablet  Commonly known as:  ULTRAM  Take 1 tablet (50 mg total) by  mouth every 6 (six)  hours as needed for moderate pain.     warfarin 5 MG tablet  Commonly known as:  COUMADIN  Take 5-7.5 mg by mouth See admin instructions. 5 mg in the evening on Sun/Tues/Thurs/Sat and 7.5 mg on Mon/Wed/Fri     ZANTAC 150 MG tablet  Generic drug:  ranitidine  Take 150 mg by mouth 2 (two) times daily.          Diet and Activity recommendation: See Discharge Instructions above   Consults obtained -  orthopedic   Major procedures and Radiology Reports - PLEASE review detailed and final reports for all details, in brief -    Ct Hip Left Wo Contrast  08/19/2015  CLINICAL DATA:  Posterior lateral left hip pain status post fall EXAM: CT OF THE LEFT HIP WITHOUT CONTRAST TECHNIQUE: Multidetector CT imaging of the left hip was performed according to the standard protocol. Multiplanar CT image reconstructions were also generated. COMPARISON:  None. FINDINGS: Bones/Joint/Cartilage Nondisplaced fracture of the left sacral ala. Nondisplaced fracture of the left superior pubic ramus at the acetabular junction. Minimally displaced fracture of the left inferior pubic ramus. No left hip fracture or dislocation. Joint space narrowing of the left hip with marginal osteophytosis consistent with mild-moderate osteoarthritis. Normal alignment. No aggressive lytic or sclerotic osseous lesion. Degenerative disc disease with disc height loss at L4-5. Muscles Normal. Soft tissue No fluid collection or hematoma.  No soft tissue mass. IMPRESSION: 1. Nondisplaced fracture of the left superior pubic ramus at the acetabular junction. Minimally displaced fracture of the left inferior pubic ramus. 2. Nondisplaced fracture of the left sacral ala. Electronically Signed   By: Kathreen Devoid   On: 08/19/2015 16:23   Dg Hip Unilat With Pelvis 2-3 Views Left  08/19/2015  CLINICAL DATA:  Posterior and lateral left hip pain status post fall. EXAM: DG HIP (WITH OR WITHOUT PELVIS) 2-3V LEFT COMPARISON:  None. FINDINGS: There is no  evidence of displaced left hip fracture, however the left hip is abnormally internally rotated. Curvilinear line is seen extending transversely through the acetabulum, raising the question about nondisplaced or minimally displaced acetabular fracture. Soft tissues are grossly normal. IMPRESSION: Abnormal rotation of the left hip with curvilinear line extending through the acetabulum. Query nondisplaced or minimally displaces fracture. Confirmation with cross-sectional imaging may be considered. Electronically Signed   By: Fidela Salisbury M.D.   On: 08/19/2015 13:53    Micro Results    No results found for this or any previous visit (from the past 240 hour(s)).     Today   Subjective:   Danielle [name] today has no headache,no chest or abdominal pain, had PT today, complaining of left pelvic pain . Objective:   Blood pressure 101/61, pulse 74, temperature 99.2 F (37.3 C), temperature source Oral, resp. rate 18, height 5\' 7"  (1.702 m), weight 93.441 kg (206 lb), SpO2 93 %.   Intake/Output Summary (Last 24 hours) at 08/20/15 1412 Last data filed at 08/20/15 0800  Gross per 24 hour  Intake    480 ml  Output      0 ml  Net    480 ml    Exam Awake Alert, Oriented x 3,  Supple Neck,No JVD, No cervical lymphadenopathy appriciated.  Symmetrical Chest wall movement, Good air movement bilaterally, CTAB RRR,No Gallops,Rubs or new Murmurs, No Parasternal Heave +ve B.Sounds, Abd Soft, Non tender, No organomegaly appriciated, No rebound -guarding or rigidity. No Cyanosis, Clubbing or edema, No new Rash or  bruise  Data Review   CBC w Diff: Lab Results  Component Value Date   WBC 9.6 08/19/2015   HGB 12.4 08/19/2015   HCT 38.5 08/19/2015   PLT 128* 08/19/2015    CMP: Lab Results  Component Value Date   NA 140 08/19/2015   K 4.8 08/19/2015   CL 110 08/19/2015   CO2 23 08/19/2015   BUN 17 08/19/2015   CREATININE 1.09* 08/19/2015  .   Total Time in preparing  paper work, data evaluation and todays exam - 35 minutes  Gertie Broerman M.D on 08/20/2015 at 2:12 PM  [facility]   Office  [phone]

## 2015-08-20 NOTE — Care Management Obs Status (Signed)
[facility] NOTIFICATION   Patient Details  Name: Danielle [name] MRN: [id number] Date of Birth: [birth year]   Medicare Observation Status Notification Given:  Yes    Ninfa Meeker, RN 08/20/2015, 4:03 PM

## 2015-08-21 ENCOUNTER — Encounter (HOSPITAL_COMMUNITY): Payer: Self-pay | Admitting: General Practice

## 2015-08-21 DIAGNOSIS — S32811A Multiple fractures of pelvis with unstable disruption of pelvic ring, initial encounter for closed fracture: Secondary | ICD-10-CM | POA: Diagnosis not present

## 2015-08-21 LAB — ABO/RH: ABO/RH(D): O POS

## 2015-08-21 LAB — CBC
HCT: 39.5 % (ref 36.0–46.0)
Hemoglobin: 12.7 g/dL (ref 12.0–15.0)
MCH: 30.4 pg (ref 26.0–34.0)
MCHC: 32.2 g/dL (ref 30.0–36.0)
MCV: 94.5 fL (ref 78.0–100.0)
PLATELETS: 112 10*3/uL — AB (ref 150–400)
RBC: 4.18 MIL/uL (ref 3.87–5.11)
RDW: 13.5 % (ref 11.5–15.5)
WBC: 12 10*3/uL — AB (ref 4.0–10.5)

## 2015-08-21 LAB — TYPE AND SCREEN
ABO/RH(D): O POS
Antibody Screen: NEGATIVE

## 2015-08-21 LAB — PROTIME-INR
INR: 3.29 — ABNORMAL HIGH (ref 0.00–1.49)
Prothrombin Time: 32.8 seconds — ABNORMAL HIGH (ref 11.6–15.2)

## 2015-08-21 MED ORDER — WARFARIN SODIUM 5 MG PO TABS: 5.0000 mg | ORAL_TABLET | ORAL | Status: DC

## 2015-08-21 NOTE — Discharge Summary (Signed)
Danielle [name], is a 70 y.o. female  DOB 1945-03-25  MRN [id number].  Admission date:  08/19/2015  Admitting Physician  Venetia Maxon Rama, MD  Discharge Date:  08/21/2015   Primary MD  Maximino Greenland, MD  Recommendations for primary care physician for things to follow:  - please check CBC, BMP during next visit. - Follow with orthopedic in 2 weeks.   Admission Diagnosis  Aortic stenosis 123XX123 Chronic systolic CHF (congestive heart failure) ([place]) [I50.22] Multiple closed fractures of pelvis with unstable disruption of pelvic circle, initial encounter ([place]) [S32.811A] Coronary artery disease due to lipid rich plaque [I25.10] Closed fracture of sacrum, unspecified fracture morphology, initial encounter ([place]) [S32.10XA]   Discharge Diagnosis  Aortic stenosis 123XX123 Chronic systolic CHF (congestive heart failure) ([place]) [I50.22] Multiple closed fractures of pelvis with unstable disruption of pelvic circle, initial encounter ([place]) [S32.811A] Coronary artery disease due to lipid rich plaque [I25.10] Closed fracture of sacrum, unspecified fracture morphology, initial encounter ([place]) [S32.10XA]    Principal Problem:   Pelvic fracture (HCC) Active Problems:   Fall at home   Hypertension   Hyperlipidemia   Asthma   Atrial fibrillation (HCC)   Chronic systolic CHF (congestive heart failure) ([place])   Coronary artery disease   Aortic stenosis   Hyperthyroidism      Past Medical History  Diagnosis Date  . Hypertension   . Asthma   . Hyperlipidemia   . Aortic stenosis     Status post bioprosthetic AVR  . Coronary artery disease   . Stroke ([place])   . Multiple thyroid nodules   . Hyperthyroidism   . Gastritis   . Chronic systolic CHF (congestive heart failure) (HCC)     EF 30% 04/2014    Past Surgical History  Procedure Laterality Date  . Valve replacement         History of  present illness and  Hospital Course:     Kindly see H&P for history of present illness and admission details, please review complete Labs, Consult reports and Test reports for all details in brief  HPI  from the history and physical done on the day of admission 08/19/2015  Danielle [name] is an 70 y.o. female with a PMH of stable asthma (uses an inhaler twice a day), hypertension, paroxysmal atrial fibrillation on chronic Coumadin, aortic stenosis status post aortic valve replacement with a bioprosthetic valve, chronic systolic CHF with an EF of 30% per echo done in March 2016, and hyperlipidemia who suffered from a mechanical fall when she lost her footing earlier today. She was reaching for an item in her pantry and lost her balance. She did not have any symptoms of dizziness or presyncope prior to the fall, and did not lose consciousness. There was no head trauma. She fell onto her buttocks and immediately had pain in her tailbone and left hip. She attempted to ambulate with the assistance of a family member, but experienced severe pain. She is brought to the hospital for further evaluation after a heating pad and  Aspercreme did not help her pain. Movement exacerbates the pain and lying still eases it off.  ED Course: The patient was given IV morphine and Dilaudid for pain control. Plain films showed a possible nondisplaced acetabular fracture. CT of the left hip was subsequently performed and showed a nondisplaced fracture of the left superior pubic ramus at the acetabular junction and a minimally displaced fracture of the left inferior pubic ramus as well as a nondisplaced fracture of the left sacral ala. Dr. Doran Durand was consulted by the EDP and will evaluate the patient, but has indicated that management will be nonoperative with pain control and weightbearing as tolerated.  Hospital Course  Pelvic fracture ([place]) secondary to fall at home Patient seen by orthopedic doctor who,  recommendation is for nonsurgical management, has been seen by PT, will be discharged home with home care, and use tramadol for pain control, he be seen by orthopedic as an outpatient  Hypertension - Has been on the lower side during hospital stay secondary to IV pain meds, will hold lisinopril on discharge, continue with Coreg   Hyperlipidemia - Continue home dose of atorvastatin.  Asthma - Continue Symbicort and albuterol as needed.  Atrial fibrillation (HCC) - Heart rate controlled, Continue Coreg , INR supratherapeutic at 0.29 today, patient instructed to hold warfarin for next 2 days, then resume as previously done.  Chronic systolic CHF (congestive heart failure) (HCC) - EF 30% on last echo. Monitor I/O and volume status closely. Currently compensated.   Coronary artery disease - Continue aspirin, statin, and beta blocker.  Hyperthyroidism - Continue Tapazole.  Genital herpes - We'll treat for 1 week of Valtrex   Aortic stenosis status post aortic valve replacement with bioprosthetic valve     Discharge Condition:  stable   Follow UP  Follow-up Information    Follow up with SANDERS,ROBYN N, MD. Schedule an appointment as soon as possible for a visit in 1 week.   Specialty:  Internal Medicine   Contact information:   [address] [place] [address] [phone]       Follow up with Wylene Simmer, MD. Schedule an appointment as soon as possible for a visit in 2 weeks.   Specialty:  Orthopedic Surgery   Contact information:   [address] [place] [address] [phone]       Follow up with [facility].   Why:  Someone from [facility] will contact you to arrange start date and time for therapy   Contact information:   [address] [place] [address] [phone]       Follow up with Maximino Greenland, MD. Schedule an appointment as soon as possible for a visit in 1 week.    Specialty:  Internal Medicine   Contact information:   [address] [place] [address] [phone]         Discharge Instructions  and  Discharge Medications         Discharge Instructions    Discharge instructions    Complete by:  As directed   Follow with Primary MD Maximino Greenland, MD in 7 days   Get CBC, CMP,  checked  by Primary MD next visit.    Activity: As tolerated with Full fall precautions use walker/cane & assistance as needed   Disposition Home    Diet: Heart Healthy  , with feeding assistance and aspiration precautions.  For Heart failure patients - Check your Weight same  time everyday, if you gain over 2 pounds, or you develop in leg swelling, experience more shortness of breath or chest pain, call your Primary MD immediately. Follow Cardiac Low Salt Diet and 1.5 lit/day fluid restriction.   On your next visit with your primary care physician please Get Medicines reviewed and adjusted.   Please request your Prim.MD to go over all Hospital Tests and Procedure/Radiological results at the follow up, please get all Hospital records sent to your Prim MD by signing hospital release before you go home.   If you experience worsening of your admission symptoms, develop shortness of breath, life threatening emergency, suicidal or homicidal thoughts you must seek medical attention immediately by calling 911 or calling your MD immediately  if symptoms less severe.  You Must read complete instructions/literature along with all the possible adverse reactions/side effects for all the Medicines you take and that have been prescribed to you. Take any new Medicines after you have completely understood and accpet all the possible adverse reactions/side effects.   Do not drive, operating heavy machinery, perform activities at heights, swimming or participation in water activities or provide baby sitting services if your were admitted for syncope or siezures  until you have seen by Primary MD or a Neurologist and advised to do so again.  Do not drive when taking Pain medications.    Do not take more than prescribed Pain, Sleep and Anxiety Medications  Special Instructions: If you have smoked or chewed Tobacco  in the last 2 yrs please stop smoking, stop any regular Alcohol  and or any Recreational drug use.  Wear Seat belts while driving.   Please note  You were cared for by a hospitalist during your hospital stay. If you have any questions about your discharge medications or the care you received while you were in the hospital after you are discharged, you can call the unit and asked to speak with the hospitalist on call if the hospitalist that took care of you is not available. Once you are discharged, your primary care physician will handle any further medical issues. Please note that NO REFILLS for any discharge medications will be authorized once you are discharged, as it is imperative that you return to your primary care physician (or establish a relationship with a primary care physician if you do not have one) for your aftercare needs so that they can reassess your need for medications and monitor your lab values.     Increase activity slowly    Complete by:  As directed             Medication List    STOP taking these medications        lisinopril 5 MG tablet  Commonly known as:  PRINIVIL,ZESTRIL     traMADol-acetaminophen 37.5-325 MG tablet  Commonly known as:  ULTRACET      TAKE these medications        acetaminophen 500 MG tablet  Commonly known as:  TYLENOL  Take 500-1,000 mg by mouth every 8 (eight) hours as needed for mild pain, moderate pain or headache.     aspirin EC 81 MG tablet  Take 81 mg by mouth daily.     carvedilol 6.25 MG tablet  Commonly known as:  COREG  Take 18.75 mg by mouth 2 (two) times daily with a meal.     LIPITOR 40 MG tablet  Generic drug:  atorvastatin  Take 40 mg by mouth at bedtime.  methimazole 5 MG tablet  Commonly known as:  TAPAZOLE  Take 15 mg by mouth every evening.     PROAIR HFA 108 (90 Base) MCG/ACT inhaler  Generic drug:  albuterol  Inhale 2 puffs into the lungs every 6 (six) hours as needed for wheezing or shortness of breath.     senna-docusate 8.6-50 MG tablet  Commonly known as:  Senokot-S  Take 1 tablet by mouth daily.     SYMBICORT 160-4.5 MCG/ACT inhaler  Generic drug:  budesonide-formoterol  Inhale 2 puffs into the lungs 2 (two) times daily.     traMADol 50 MG tablet  Commonly known as:  ULTRAM  Take 1 tablet (50 mg total) by mouth every 6 (six) hours as needed for moderate pain.     valACYclovir 1000 MG tablet  Commonly known as:  VALTREX  Take 1 tablet (1,000 mg total) by mouth 2 (two) times daily.     warfarin 5 MG tablet  Commonly known as:  COUMADIN  Take 1-1.5 tablets (5-7.5 mg total) by mouth See admin instructions. Please hold warfarin on 6/22, 6/23, resume as previously done on 6/24  5 mg in the evening on Sun/Tues/Thurs/Sat and 7.5 mg on Mon/Wed/Fri  Start taking on:  08/23/2015     ZANTAC 150 MG tablet  Generic drug:  ranitidine  Take 150 mg by mouth 2 (two) times daily.          Diet and Activity recommendation: See Discharge Instructions above   Consults obtained -  orthopedic   Major procedures and Radiology Reports - PLEASE review detailed and final reports for all details, in brief -    Ct Hip Left Wo Contrast  08/19/2015  CLINICAL DATA:  Posterior lateral left hip pain status post fall EXAM: CT OF THE LEFT HIP WITHOUT CONTRAST TECHNIQUE: Multidetector CT imaging of the left hip was performed according to the standard protocol. Multiplanar CT image reconstructions were also generated. COMPARISON:  None. FINDINGS: Bones/Joint/Cartilage Nondisplaced fracture of the left sacral ala. Nondisplaced fracture of the left superior pubic ramus at the acetabular junction. Minimally displaced fracture of the left  inferior pubic ramus. No left hip fracture or dislocation. Joint space narrowing of the left hip with marginal osteophytosis consistent with mild-moderate osteoarthritis. Normal alignment. No aggressive lytic or sclerotic osseous lesion. Degenerative disc disease with disc height loss at L4-5. Muscles Normal. Soft tissue No fluid collection or hematoma.  No soft tissue mass. IMPRESSION: 1. Nondisplaced fracture of the left superior pubic ramus at the acetabular junction. Minimally displaced fracture of the left inferior pubic ramus. 2. Nondisplaced fracture of the left sacral ala. Electronically Signed   By: Kathreen Devoid   On: 08/19/2015 16:23   Dg Hip Unilat With Pelvis 2-3 Views Left  08/19/2015  CLINICAL DATA:  Posterior and lateral left hip pain status post fall. EXAM: DG HIP (WITH OR WITHOUT PELVIS) 2-3V LEFT COMPARISON:  None. FINDINGS: There is no evidence of displaced left hip fracture, however the left hip is abnormally internally rotated. Curvilinear line is seen extending transversely through the acetabulum, raising the question about nondisplaced or minimally displaced acetabular fracture. Soft tissues are grossly normal. IMPRESSION: Abnormal rotation of the left hip with curvilinear line extending through the acetabulum. Query nondisplaced or minimally displaces fracture. Confirmation with cross-sectional imaging may be considered. Electronically Signed   By: Fidela Salisbury M.D.   On: 08/19/2015 13:53    Micro Results    No results found for this or any previous visit (  from the past 240 hour(s)).     Today   Subjective:   [name] today has no headache,no chest or abdominal pain, had PT today, complaining of left pelvic pain . Objective:   Blood pressure 88/61, pulse 94, temperature 98.7 F (37.1 C), temperature source Oral, resp. rate 20, height 5\' 7"  (1.702 m), weight 93.441 kg (206 lb), SpO2 90 %.   Intake/Output Summary (Last 24 hours) at 08/21/15  1220 Last data filed at 08/21/15 0845  Gross per 24 hour  Intake    250 ml  Output      0 ml  Net    250 ml    Exam Awake Alert, Oriented x 3,  Supple Neck,No JVD, No cervical lymphadenopathy appriciated.  Symmetrical Chest wall movement, Good air movement bilaterally, CTAB RRR,No Gallops,Rubs or new Murmurs, No Parasternal Heave +ve B.Sounds, Abd Soft, Non tender, No organomegaly appriciated, No rebound -guarding or rigidity. No Cyanosis, Clubbing or edema, No new Rash or bruise  Data Review   CBC w Diff:  Lab Results  Component Value Date   WBC 12.0* 08/21/2015   HGB 12.7 08/21/2015   HCT 39.5 08/21/2015   PLT PENDING 08/21/2015    CMP:  Lab Results  Component Value Date   NA 140 08/19/2015   K 4.8 08/19/2015   CL 110 08/19/2015   CO2 23 08/19/2015   BUN 17 08/19/2015   CREATININE 1.09* 08/19/2015  .   Total Time in preparing paper work, data evaluation and todays exam - 35 minutes  Fenris Cauble M.D on 08/21/2015 at 12:20 PM  [facility]   Office  [phone]

## 2015-08-21 NOTE — Progress Notes (Signed)
[name] to be D/C'd Home per MD order.  Discussed with the patient and all questions fully answered.  VSS, Skin clean, dry and intact without evidence of skin break down, no evidence of skin tears noted. IV catheter discontinued intact. Site without signs and symptoms of complications. Dressing and pressure applied.  An After Visit Summary was printed and given to the patient. Patient received prescription.  D/c education completed with patient/family including follow up instructions, medication list, d/c activities limitations if indicated, with other d/c instructions as indicated by MD - patient able to verbalize understanding, all questions fully answered.   Patient instructed to return to ED, call 911, or call MD for any changes in condition.   Patient will be escorted via WC, and D/C home via private auto.  Jerry Caras 08/21/2015 4:23 PM

## 2015-08-21 NOTE — Progress Notes (Signed)
Physical Therapy Treatment Patient Details Name: Danielle [name] MRN: [id number] DOB: 18-Oct-1945 Today's Date: 08/21/2015    History of Present Illness pt presents after a ground level fall sustaining L Superior Pubic rami fx at Acetabular junction, L minimally displaced Inferior Pubic Rami fx, and L Sacral Ala fx.  pt with hx of HTN, CVA, CAD, CHF, and Valve Replacement.      PT Comments    Pt contineus to require 2 person A for mobility.  Pt has trouble coordinating use of RW and hopping on R foot, and often is placing more than TTWBing on L LE.  Strong education on safe technique and weightbearing status.  If pt is unable to progress with mobility, she may need to consider alternative D/C plan such as SNF.  Will continue to follow.    Follow Up Recommendations  Home health PT;Supervision/Assistance - 24 hour     Equipment Recommendations  3in1 (PT);Wheelchair (measurements PT);Wheelchair cushion (measurements PT) Lobbyist)    Recommendations for Other Services       Precautions / Restrictions Precautions Precautions: Fall Restrictions Weight Bearing Restrictions: Yes LLE Weight Bearing: Touchdown weight bearing    Mobility  Bed Mobility Overal bed mobility: Needs Assistance Bed Mobility: Supine to Sit     Supine to sit: Mod assist     General bed mobility comments: pt very painful and needs max encouragement to try to do as much as she can.  A with pad under hips to scoot closer to EOB and with helping to bring trunk to sitting.    Transfers Overall transfer level: Needs assistance Equipment used: Rolling walker (2 wheeled) Transfers: Sit to/from Stand Sit to Stand: Min assist;Mod assist;+2 physical assistance         General transfer comment: pt fluctuates between MinA x2 and [place] x2 for coming to standing.  pt needs direct cues as pt tends to lean to L on PT and place mroe weight on L LE than TTWB.  Repeated transfer  x3.  Ambulation/Gait Ambulation/Gait assistance: Min assist;+2 physical assistance Ambulation Distance (Feet): 5 Feet (5' with 3 attempts to make it that far.) Assistive device: Rolling walker (2 wheeled) Gait Pattern/deviations: Step-to pattern;Trunk flexed     General Gait Details: pt tends to lean anteriorly and to L side pushing RW too far out in front of her.  Direct cues and demonstration on technique to use RW and hop on R foot.  pt seems to get nervous and is unable to coordinate use of RW and hop.     Stairs            Wheelchair Mobility    Modified Rankin (Stroke Patients Only)       Balance Overall balance assessment: Needs assistance Sitting-balance support: Single extremity supported;Feet supported Sitting balance-Leahy Scale: Fair     Standing balance support: Bilateral upper extremity supported;During functional activity Standing balance-Leahy Scale: Poor                      Cognition Arousal/Alertness: Awake/alert Behavior During Therapy: WFL for tasks assessed/performed Overall Cognitive Status: Within Functional Limits for tasks assessed                      Exercises      General Comments        Pertinent Vitals/Pain Pain Assessment: 0-10 Pain Score: 8  Pain Location: L hip Pain Descriptors / Indicators: Grimacing;Guarding;Sore;Sharp Pain Intervention(s): Monitored during session;Premedicated before session;Repositioned  Home Living                      Prior Function            PT Goals (current goals can now be found in the care plan section) Acute Rehab PT Goals Patient Stated Goal: Heal up and walk PT Goal Formulation: With patient Time For Goal Achievement: 09/03/15 Potential to Achieve Goals: Good Progress towards PT goals: Progressing toward goals    Frequency  Min 5X/week    PT Plan Current plan remains appropriate    Co-evaluation             End of Session Equipment Utilized  During Treatment: Gait belt Activity Tolerance: Patient limited by fatigue;Patient limited by pain Patient left: in chair;with call bell/phone within reach     Time: 0823-0851 PT Time Calculation (min) (ACUTE ONLY): 28 min  Charges:  $Gait Training: 8-22 mins $Therapeutic Activity: 8-22 mins                    G CodesCatarina Hartshorn, [place] 08/21/2015, 9:59 AM

## 2015-08-23 DIAGNOSIS — Z9181 History of falling: Secondary | ICD-10-CM | POA: Diagnosis not present

## 2015-08-23 DIAGNOSIS — S3210XD Unspecified fracture of sacrum, subsequent encounter for fracture with routine healing: Secondary | ICD-10-CM | POA: Diagnosis not present

## 2015-08-23 DIAGNOSIS — S32811D Multiple fractures of pelvis with unstable disruption of pelvic ring, subsequent encounter for fracture with routine healing: Secondary | ICD-10-CM | POA: Diagnosis not present

## 2015-08-23 DIAGNOSIS — Z7901 Long term (current) use of anticoagulants: Secondary | ICD-10-CM | POA: Diagnosis not present

## 2015-08-23 DIAGNOSIS — J45909 Unspecified asthma, uncomplicated: Secondary | ICD-10-CM | POA: Diagnosis not present

## 2015-08-23 DIAGNOSIS — Z7982 Long term (current) use of aspirin: Secondary | ICD-10-CM | POA: Diagnosis not present

## 2015-08-23 DIAGNOSIS — I4891 Unspecified atrial fibrillation: Secondary | ICD-10-CM | POA: Diagnosis not present

## 2015-08-23 DIAGNOSIS — I350 Nonrheumatic aortic (valve) stenosis: Secondary | ICD-10-CM | POA: Diagnosis not present

## 2015-08-23 DIAGNOSIS — E785 Hyperlipidemia, unspecified: Secondary | ICD-10-CM | POA: Diagnosis not present

## 2015-08-23 DIAGNOSIS — I2510 Atherosclerotic heart disease of native coronary artery without angina pectoris: Secondary | ICD-10-CM | POA: Diagnosis not present

## 2015-08-23 DIAGNOSIS — E039 Hypothyroidism, unspecified: Secondary | ICD-10-CM | POA: Diagnosis not present

## 2015-08-23 DIAGNOSIS — I110 Hypertensive heart disease with heart failure: Secondary | ICD-10-CM | POA: Diagnosis not present

## 2015-08-23 DIAGNOSIS — I5022 Chronic systolic (congestive) heart failure: Secondary | ICD-10-CM | POA: Diagnosis not present

## 2015-08-27 DIAGNOSIS — E039 Hypothyroidism, unspecified: Secondary | ICD-10-CM | POA: Diagnosis not present

## 2015-08-27 DIAGNOSIS — I350 Nonrheumatic aortic (valve) stenosis: Secondary | ICD-10-CM | POA: Diagnosis not present

## 2015-08-27 DIAGNOSIS — I110 Hypertensive heart disease with heart failure: Secondary | ICD-10-CM | POA: Diagnosis not present

## 2015-08-27 DIAGNOSIS — S32811D Multiple fractures of pelvis with unstable disruption of pelvic ring, subsequent encounter for fracture with routine healing: Secondary | ICD-10-CM | POA: Diagnosis not present

## 2015-08-27 DIAGNOSIS — I2510 Atherosclerotic heart disease of native coronary artery without angina pectoris: Secondary | ICD-10-CM | POA: Diagnosis not present

## 2015-08-27 DIAGNOSIS — Z7901 Long term (current) use of anticoagulants: Secondary | ICD-10-CM | POA: Diagnosis not present

## 2015-08-27 DIAGNOSIS — Z7982 Long term (current) use of aspirin: Secondary | ICD-10-CM | POA: Diagnosis not present

## 2015-08-27 DIAGNOSIS — I4891 Unspecified atrial fibrillation: Secondary | ICD-10-CM | POA: Diagnosis not present

## 2015-08-27 DIAGNOSIS — E785 Hyperlipidemia, unspecified: Secondary | ICD-10-CM | POA: Diagnosis not present

## 2015-08-27 DIAGNOSIS — J45909 Unspecified asthma, uncomplicated: Secondary | ICD-10-CM | POA: Diagnosis not present

## 2015-08-27 DIAGNOSIS — I5022 Chronic systolic (congestive) heart failure: Secondary | ICD-10-CM | POA: Diagnosis not present

## 2015-08-27 DIAGNOSIS — S3210XD Unspecified fracture of sacrum, subsequent encounter for fracture with routine healing: Secondary | ICD-10-CM | POA: Diagnosis not present

## 2015-08-27 DIAGNOSIS — Z9181 History of falling: Secondary | ICD-10-CM | POA: Diagnosis not present

## 2015-08-28 DIAGNOSIS — E785 Hyperlipidemia, unspecified: Secondary | ICD-10-CM | POA: Diagnosis not present

## 2015-08-28 DIAGNOSIS — I2510 Atherosclerotic heart disease of native coronary artery without angina pectoris: Secondary | ICD-10-CM | POA: Diagnosis not present

## 2015-08-28 DIAGNOSIS — I110 Hypertensive heart disease with heart failure: Secondary | ICD-10-CM | POA: Diagnosis not present

## 2015-08-28 DIAGNOSIS — J45909 Unspecified asthma, uncomplicated: Secondary | ICD-10-CM | POA: Diagnosis not present

## 2015-08-28 DIAGNOSIS — I5022 Chronic systolic (congestive) heart failure: Secondary | ICD-10-CM | POA: Diagnosis not present

## 2015-08-28 DIAGNOSIS — S32811D Multiple fractures of pelvis with unstable disruption of pelvic ring, subsequent encounter for fracture with routine healing: Secondary | ICD-10-CM | POA: Diagnosis not present

## 2015-08-28 DIAGNOSIS — E039 Hypothyroidism, unspecified: Secondary | ICD-10-CM | POA: Diagnosis not present

## 2015-08-28 DIAGNOSIS — S3210XD Unspecified fracture of sacrum, subsequent encounter for fracture with routine healing: Secondary | ICD-10-CM | POA: Diagnosis not present

## 2015-08-28 DIAGNOSIS — I4891 Unspecified atrial fibrillation: Secondary | ICD-10-CM | POA: Diagnosis not present

## 2015-08-28 DIAGNOSIS — Z7982 Long term (current) use of aspirin: Secondary | ICD-10-CM | POA: Diagnosis not present

## 2015-08-28 DIAGNOSIS — Z7901 Long term (current) use of anticoagulants: Secondary | ICD-10-CM | POA: Diagnosis not present

## 2015-08-28 DIAGNOSIS — I350 Nonrheumatic aortic (valve) stenosis: Secondary | ICD-10-CM | POA: Diagnosis not present

## 2015-08-28 DIAGNOSIS — Z9181 History of falling: Secondary | ICD-10-CM | POA: Diagnosis not present

## 2015-09-03 DIAGNOSIS — E039 Hypothyroidism, unspecified: Secondary | ICD-10-CM | POA: Diagnosis not present

## 2015-09-03 DIAGNOSIS — S32512A Fracture of superior rim of left pubis, initial encounter for closed fracture: Secondary | ICD-10-CM | POA: Diagnosis not present

## 2015-09-03 DIAGNOSIS — S3210XD Unspecified fracture of sacrum, subsequent encounter for fracture with routine healing: Secondary | ICD-10-CM | POA: Diagnosis not present

## 2015-09-03 DIAGNOSIS — I350 Nonrheumatic aortic (valve) stenosis: Secondary | ICD-10-CM | POA: Diagnosis not present

## 2015-09-03 DIAGNOSIS — S32592A Other specified fracture of left pubis, initial encounter for closed fracture: Secondary | ICD-10-CM | POA: Diagnosis not present

## 2015-09-03 DIAGNOSIS — Z7982 Long term (current) use of aspirin: Secondary | ICD-10-CM | POA: Diagnosis not present

## 2015-09-03 DIAGNOSIS — Z7901 Long term (current) use of anticoagulants: Secondary | ICD-10-CM | POA: Diagnosis not present

## 2015-09-03 DIAGNOSIS — S32811D Multiple fractures of pelvis with unstable disruption of pelvic ring, subsequent encounter for fracture with routine healing: Secondary | ICD-10-CM | POA: Diagnosis not present

## 2015-09-03 DIAGNOSIS — E785 Hyperlipidemia, unspecified: Secondary | ICD-10-CM | POA: Diagnosis not present

## 2015-09-03 DIAGNOSIS — I5022 Chronic systolic (congestive) heart failure: Secondary | ICD-10-CM | POA: Diagnosis not present

## 2015-09-03 DIAGNOSIS — I110 Hypertensive heart disease with heart failure: Secondary | ICD-10-CM | POA: Diagnosis not present

## 2015-09-03 DIAGNOSIS — I2510 Atherosclerotic heart disease of native coronary artery without angina pectoris: Secondary | ICD-10-CM | POA: Diagnosis not present

## 2015-09-03 DIAGNOSIS — Z9181 History of falling: Secondary | ICD-10-CM | POA: Diagnosis not present

## 2015-09-03 DIAGNOSIS — J45909 Unspecified asthma, uncomplicated: Secondary | ICD-10-CM | POA: Diagnosis not present

## 2015-09-03 DIAGNOSIS — I4891 Unspecified atrial fibrillation: Secondary | ICD-10-CM | POA: Diagnosis not present

## 2015-09-04 DIAGNOSIS — I2510 Atherosclerotic heart disease of native coronary artery without angina pectoris: Secondary | ICD-10-CM | POA: Diagnosis not present

## 2015-09-04 DIAGNOSIS — J45909 Unspecified asthma, uncomplicated: Secondary | ICD-10-CM | POA: Diagnosis not present

## 2015-09-04 DIAGNOSIS — I5022 Chronic systolic (congestive) heart failure: Secondary | ICD-10-CM | POA: Diagnosis not present

## 2015-09-04 DIAGNOSIS — Z7982 Long term (current) use of aspirin: Secondary | ICD-10-CM | POA: Diagnosis not present

## 2015-09-04 DIAGNOSIS — I350 Nonrheumatic aortic (valve) stenosis: Secondary | ICD-10-CM | POA: Diagnosis not present

## 2015-09-04 DIAGNOSIS — I4891 Unspecified atrial fibrillation: Secondary | ICD-10-CM | POA: Diagnosis not present

## 2015-09-04 DIAGNOSIS — Z9181 History of falling: Secondary | ICD-10-CM | POA: Diagnosis not present

## 2015-09-04 DIAGNOSIS — E039 Hypothyroidism, unspecified: Secondary | ICD-10-CM | POA: Diagnosis not present

## 2015-09-04 DIAGNOSIS — S32811D Multiple fractures of pelvis with unstable disruption of pelvic ring, subsequent encounter for fracture with routine healing: Secondary | ICD-10-CM | POA: Diagnosis not present

## 2015-09-04 DIAGNOSIS — Z7901 Long term (current) use of anticoagulants: Secondary | ICD-10-CM | POA: Diagnosis not present

## 2015-09-04 DIAGNOSIS — S3210XD Unspecified fracture of sacrum, subsequent encounter for fracture with routine healing: Secondary | ICD-10-CM | POA: Diagnosis not present

## 2015-09-04 DIAGNOSIS — I110 Hypertensive heart disease with heart failure: Secondary | ICD-10-CM | POA: Diagnosis not present

## 2015-09-04 DIAGNOSIS — E785 Hyperlipidemia, unspecified: Secondary | ICD-10-CM | POA: Diagnosis not present

## 2015-09-09 DIAGNOSIS — I350 Nonrheumatic aortic (valve) stenosis: Secondary | ICD-10-CM | POA: Diagnosis not present

## 2015-09-09 DIAGNOSIS — E785 Hyperlipidemia, unspecified: Secondary | ICD-10-CM | POA: Diagnosis not present

## 2015-09-09 DIAGNOSIS — I4891 Unspecified atrial fibrillation: Secondary | ICD-10-CM | POA: Diagnosis not present

## 2015-09-09 DIAGNOSIS — I2510 Atherosclerotic heart disease of native coronary artery without angina pectoris: Secondary | ICD-10-CM | POA: Diagnosis not present

## 2015-09-09 DIAGNOSIS — S32811D Multiple fractures of pelvis with unstable disruption of pelvic ring, subsequent encounter for fracture with routine healing: Secondary | ICD-10-CM | POA: Diagnosis not present

## 2015-09-09 DIAGNOSIS — E039 Hypothyroidism, unspecified: Secondary | ICD-10-CM | POA: Diagnosis not present

## 2015-09-09 DIAGNOSIS — I5022 Chronic systolic (congestive) heart failure: Secondary | ICD-10-CM | POA: Diagnosis not present

## 2015-09-09 DIAGNOSIS — Z7982 Long term (current) use of aspirin: Secondary | ICD-10-CM | POA: Diagnosis not present

## 2015-09-09 DIAGNOSIS — S3210XD Unspecified fracture of sacrum, subsequent encounter for fracture with routine healing: Secondary | ICD-10-CM | POA: Diagnosis not present

## 2015-09-09 DIAGNOSIS — Z7901 Long term (current) use of anticoagulants: Secondary | ICD-10-CM | POA: Diagnosis not present

## 2015-09-09 DIAGNOSIS — J45909 Unspecified asthma, uncomplicated: Secondary | ICD-10-CM | POA: Diagnosis not present

## 2015-09-09 DIAGNOSIS — Z9181 History of falling: Secondary | ICD-10-CM | POA: Diagnosis not present

## 2015-09-09 DIAGNOSIS — I110 Hypertensive heart disease with heart failure: Secondary | ICD-10-CM | POA: Diagnosis not present

## 2015-09-11 DIAGNOSIS — J45909 Unspecified asthma, uncomplicated: Secondary | ICD-10-CM | POA: Diagnosis not present

## 2015-09-11 DIAGNOSIS — E039 Hypothyroidism, unspecified: Secondary | ICD-10-CM | POA: Diagnosis not present

## 2015-09-11 DIAGNOSIS — I350 Nonrheumatic aortic (valve) stenosis: Secondary | ICD-10-CM | POA: Diagnosis not present

## 2015-09-11 DIAGNOSIS — I4891 Unspecified atrial fibrillation: Secondary | ICD-10-CM | POA: Diagnosis not present

## 2015-09-11 DIAGNOSIS — I2510 Atherosclerotic heart disease of native coronary artery without angina pectoris: Secondary | ICD-10-CM | POA: Diagnosis not present

## 2015-09-11 DIAGNOSIS — Z7901 Long term (current) use of anticoagulants: Secondary | ICD-10-CM | POA: Diagnosis not present

## 2015-09-11 DIAGNOSIS — Z7982 Long term (current) use of aspirin: Secondary | ICD-10-CM | POA: Diagnosis not present

## 2015-09-11 DIAGNOSIS — S3210XD Unspecified fracture of sacrum, subsequent encounter for fracture with routine healing: Secondary | ICD-10-CM | POA: Diagnosis not present

## 2015-09-11 DIAGNOSIS — S32811D Multiple fractures of pelvis with unstable disruption of pelvic ring, subsequent encounter for fracture with routine healing: Secondary | ICD-10-CM | POA: Diagnosis not present

## 2015-09-11 DIAGNOSIS — E785 Hyperlipidemia, unspecified: Secondary | ICD-10-CM | POA: Diagnosis not present

## 2015-09-11 DIAGNOSIS — I5022 Chronic systolic (congestive) heart failure: Secondary | ICD-10-CM | POA: Diagnosis not present

## 2015-09-11 DIAGNOSIS — I110 Hypertensive heart disease with heart failure: Secondary | ICD-10-CM | POA: Diagnosis not present

## 2015-09-11 DIAGNOSIS — Z9181 History of falling: Secondary | ICD-10-CM | POA: Diagnosis not present

## 2015-09-16 DIAGNOSIS — E785 Hyperlipidemia, unspecified: Secondary | ICD-10-CM | POA: Diagnosis not present

## 2015-09-16 DIAGNOSIS — I110 Hypertensive heart disease with heart failure: Secondary | ICD-10-CM | POA: Diagnosis not present

## 2015-09-16 DIAGNOSIS — I4891 Unspecified atrial fibrillation: Secondary | ICD-10-CM | POA: Diagnosis not present

## 2015-09-16 DIAGNOSIS — S32811D Multiple fractures of pelvis with unstable disruption of pelvic ring, subsequent encounter for fracture with routine healing: Secondary | ICD-10-CM | POA: Diagnosis not present

## 2015-09-16 DIAGNOSIS — Z7901 Long term (current) use of anticoagulants: Secondary | ICD-10-CM | POA: Diagnosis not present

## 2015-09-16 DIAGNOSIS — Z9181 History of falling: Secondary | ICD-10-CM | POA: Diagnosis not present

## 2015-09-16 DIAGNOSIS — I2510 Atherosclerotic heart disease of native coronary artery without angina pectoris: Secondary | ICD-10-CM | POA: Diagnosis not present

## 2015-09-16 DIAGNOSIS — Z7982 Long term (current) use of aspirin: Secondary | ICD-10-CM | POA: Diagnosis not present

## 2015-09-16 DIAGNOSIS — I5022 Chronic systolic (congestive) heart failure: Secondary | ICD-10-CM | POA: Diagnosis not present

## 2015-09-16 DIAGNOSIS — S3210XD Unspecified fracture of sacrum, subsequent encounter for fracture with routine healing: Secondary | ICD-10-CM | POA: Diagnosis not present

## 2015-09-16 DIAGNOSIS — E039 Hypothyroidism, unspecified: Secondary | ICD-10-CM | POA: Diagnosis not present

## 2015-09-16 DIAGNOSIS — J45909 Unspecified asthma, uncomplicated: Secondary | ICD-10-CM | POA: Diagnosis not present

## 2015-09-16 DIAGNOSIS — I350 Nonrheumatic aortic (valve) stenosis: Secondary | ICD-10-CM | POA: Diagnosis not present

## 2015-09-17 DIAGNOSIS — S32592D Other specified fracture of left pubis, subsequent encounter for fracture with routine healing: Secondary | ICD-10-CM | POA: Diagnosis not present

## 2015-09-17 DIAGNOSIS — S32512D Fracture of superior rim of left pubis, subsequent encounter for fracture with routine healing: Secondary | ICD-10-CM | POA: Diagnosis not present

## 2015-09-18 DIAGNOSIS — I350 Nonrheumatic aortic (valve) stenosis: Secondary | ICD-10-CM | POA: Diagnosis not present

## 2015-09-18 DIAGNOSIS — Z7982 Long term (current) use of aspirin: Secondary | ICD-10-CM | POA: Diagnosis not present

## 2015-09-18 DIAGNOSIS — I110 Hypertensive heart disease with heart failure: Secondary | ICD-10-CM | POA: Diagnosis not present

## 2015-09-18 DIAGNOSIS — I4891 Unspecified atrial fibrillation: Secondary | ICD-10-CM | POA: Diagnosis not present

## 2015-09-18 DIAGNOSIS — I5022 Chronic systolic (congestive) heart failure: Secondary | ICD-10-CM | POA: Diagnosis not present

## 2015-09-18 DIAGNOSIS — Z7901 Long term (current) use of anticoagulants: Secondary | ICD-10-CM | POA: Diagnosis not present

## 2015-09-18 DIAGNOSIS — I2510 Atherosclerotic heart disease of native coronary artery without angina pectoris: Secondary | ICD-10-CM | POA: Diagnosis not present

## 2015-09-18 DIAGNOSIS — J45909 Unspecified asthma, uncomplicated: Secondary | ICD-10-CM | POA: Diagnosis not present

## 2015-09-18 DIAGNOSIS — S3210XD Unspecified fracture of sacrum, subsequent encounter for fracture with routine healing: Secondary | ICD-10-CM | POA: Diagnosis not present

## 2015-09-18 DIAGNOSIS — S32811D Multiple fractures of pelvis with unstable disruption of pelvic ring, subsequent encounter for fracture with routine healing: Secondary | ICD-10-CM | POA: Diagnosis not present

## 2015-09-18 DIAGNOSIS — Z9181 History of falling: Secondary | ICD-10-CM | POA: Diagnosis not present

## 2015-09-18 DIAGNOSIS — E039 Hypothyroidism, unspecified: Secondary | ICD-10-CM | POA: Diagnosis not present

## 2015-09-18 DIAGNOSIS — E785 Hyperlipidemia, unspecified: Secondary | ICD-10-CM | POA: Diagnosis not present

## 2015-09-22 DIAGNOSIS — Z7901 Long term (current) use of anticoagulants: Secondary | ICD-10-CM | POA: Diagnosis not present

## 2015-09-22 DIAGNOSIS — I4891 Unspecified atrial fibrillation: Secondary | ICD-10-CM | POA: Diagnosis not present

## 2015-09-22 DIAGNOSIS — I5022 Chronic systolic (congestive) heart failure: Secondary | ICD-10-CM | POA: Diagnosis not present

## 2015-09-22 DIAGNOSIS — J45909 Unspecified asthma, uncomplicated: Secondary | ICD-10-CM | POA: Diagnosis not present

## 2015-09-22 DIAGNOSIS — S3210XD Unspecified fracture of sacrum, subsequent encounter for fracture with routine healing: Secondary | ICD-10-CM | POA: Diagnosis not present

## 2015-09-22 DIAGNOSIS — E785 Hyperlipidemia, unspecified: Secondary | ICD-10-CM | POA: Diagnosis not present

## 2015-09-22 DIAGNOSIS — Z9181 History of falling: Secondary | ICD-10-CM | POA: Diagnosis not present

## 2015-09-22 DIAGNOSIS — I110 Hypertensive heart disease with heart failure: Secondary | ICD-10-CM | POA: Diagnosis not present

## 2015-09-22 DIAGNOSIS — S32811D Multiple fractures of pelvis with unstable disruption of pelvic ring, subsequent encounter for fracture with routine healing: Secondary | ICD-10-CM | POA: Diagnosis not present

## 2015-09-22 DIAGNOSIS — I350 Nonrheumatic aortic (valve) stenosis: Secondary | ICD-10-CM | POA: Diagnosis not present

## 2015-09-22 DIAGNOSIS — E039 Hypothyroidism, unspecified: Secondary | ICD-10-CM | POA: Diagnosis not present

## 2015-09-22 DIAGNOSIS — I2510 Atherosclerotic heart disease of native coronary artery without angina pectoris: Secondary | ICD-10-CM | POA: Diagnosis not present

## 2015-09-22 DIAGNOSIS — Z7982 Long term (current) use of aspirin: Secondary | ICD-10-CM | POA: Diagnosis not present

## 2015-09-23 DIAGNOSIS — Z7901 Long term (current) use of anticoagulants: Secondary | ICD-10-CM | POA: Diagnosis not present

## 2015-09-23 DIAGNOSIS — J45909 Unspecified asthma, uncomplicated: Secondary | ICD-10-CM | POA: Diagnosis not present

## 2015-09-23 DIAGNOSIS — E039 Hypothyroidism, unspecified: Secondary | ICD-10-CM | POA: Diagnosis not present

## 2015-09-23 DIAGNOSIS — I2510 Atherosclerotic heart disease of native coronary artery without angina pectoris: Secondary | ICD-10-CM | POA: Diagnosis not present

## 2015-09-23 DIAGNOSIS — I4891 Unspecified atrial fibrillation: Secondary | ICD-10-CM | POA: Diagnosis not present

## 2015-09-23 DIAGNOSIS — E785 Hyperlipidemia, unspecified: Secondary | ICD-10-CM | POA: Diagnosis not present

## 2015-09-23 DIAGNOSIS — S32811D Multiple fractures of pelvis with unstable disruption of pelvic ring, subsequent encounter for fracture with routine healing: Secondary | ICD-10-CM | POA: Diagnosis not present

## 2015-09-23 DIAGNOSIS — Z9181 History of falling: Secondary | ICD-10-CM | POA: Diagnosis not present

## 2015-09-23 DIAGNOSIS — S3210XD Unspecified fracture of sacrum, subsequent encounter for fracture with routine healing: Secondary | ICD-10-CM | POA: Diagnosis not present

## 2015-09-23 DIAGNOSIS — I5022 Chronic systolic (congestive) heart failure: Secondary | ICD-10-CM | POA: Diagnosis not present

## 2015-09-23 DIAGNOSIS — Z7982 Long term (current) use of aspirin: Secondary | ICD-10-CM | POA: Diagnosis not present

## 2015-09-23 DIAGNOSIS — I350 Nonrheumatic aortic (valve) stenosis: Secondary | ICD-10-CM | POA: Diagnosis not present

## 2015-09-23 DIAGNOSIS — I110 Hypertensive heart disease with heart failure: Secondary | ICD-10-CM | POA: Diagnosis not present

## 2015-09-24 DIAGNOSIS — S32811D Multiple fractures of pelvis with unstable disruption of pelvic ring, subsequent encounter for fracture with routine healing: Secondary | ICD-10-CM | POA: Diagnosis not present

## 2015-09-24 DIAGNOSIS — I5022 Chronic systolic (congestive) heart failure: Secondary | ICD-10-CM | POA: Diagnosis not present

## 2015-09-24 DIAGNOSIS — Z7901 Long term (current) use of anticoagulants: Secondary | ICD-10-CM | POA: Diagnosis not present

## 2015-09-24 DIAGNOSIS — S3210XD Unspecified fracture of sacrum, subsequent encounter for fracture with routine healing: Secondary | ICD-10-CM | POA: Diagnosis not present

## 2015-09-24 DIAGNOSIS — I350 Nonrheumatic aortic (valve) stenosis: Secondary | ICD-10-CM | POA: Diagnosis not present

## 2015-09-24 DIAGNOSIS — Z7982 Long term (current) use of aspirin: Secondary | ICD-10-CM | POA: Diagnosis not present

## 2015-09-24 DIAGNOSIS — I4891 Unspecified atrial fibrillation: Secondary | ICD-10-CM | POA: Diagnosis not present

## 2015-09-24 DIAGNOSIS — E039 Hypothyroidism, unspecified: Secondary | ICD-10-CM | POA: Diagnosis not present

## 2015-09-24 DIAGNOSIS — I110 Hypertensive heart disease with heart failure: Secondary | ICD-10-CM | POA: Diagnosis not present

## 2015-09-24 DIAGNOSIS — Z9181 History of falling: Secondary | ICD-10-CM | POA: Diagnosis not present

## 2015-09-24 DIAGNOSIS — I2510 Atherosclerotic heart disease of native coronary artery without angina pectoris: Secondary | ICD-10-CM | POA: Diagnosis not present

## 2015-09-24 DIAGNOSIS — E785 Hyperlipidemia, unspecified: Secondary | ICD-10-CM | POA: Diagnosis not present

## 2015-09-24 DIAGNOSIS — J45909 Unspecified asthma, uncomplicated: Secondary | ICD-10-CM | POA: Diagnosis not present

## 2015-09-25 DIAGNOSIS — I2510 Atherosclerotic heart disease of native coronary artery without angina pectoris: Secondary | ICD-10-CM | POA: Diagnosis not present

## 2015-09-25 DIAGNOSIS — E039 Hypothyroidism, unspecified: Secondary | ICD-10-CM | POA: Diagnosis not present

## 2015-09-25 DIAGNOSIS — Z7901 Long term (current) use of anticoagulants: Secondary | ICD-10-CM | POA: Diagnosis not present

## 2015-09-25 DIAGNOSIS — J45909 Unspecified asthma, uncomplicated: Secondary | ICD-10-CM | POA: Diagnosis not present

## 2015-09-25 DIAGNOSIS — Z9181 History of falling: Secondary | ICD-10-CM | POA: Diagnosis not present

## 2015-09-25 DIAGNOSIS — I350 Nonrheumatic aortic (valve) stenosis: Secondary | ICD-10-CM | POA: Diagnosis not present

## 2015-09-25 DIAGNOSIS — S3210XD Unspecified fracture of sacrum, subsequent encounter for fracture with routine healing: Secondary | ICD-10-CM | POA: Diagnosis not present

## 2015-09-25 DIAGNOSIS — I5022 Chronic systolic (congestive) heart failure: Secondary | ICD-10-CM | POA: Diagnosis not present

## 2015-09-25 DIAGNOSIS — E785 Hyperlipidemia, unspecified: Secondary | ICD-10-CM | POA: Diagnosis not present

## 2015-09-25 DIAGNOSIS — S32811D Multiple fractures of pelvis with unstable disruption of pelvic ring, subsequent encounter for fracture with routine healing: Secondary | ICD-10-CM | POA: Diagnosis not present

## 2015-09-25 DIAGNOSIS — I4891 Unspecified atrial fibrillation: Secondary | ICD-10-CM | POA: Diagnosis not present

## 2015-09-25 DIAGNOSIS — I110 Hypertensive heart disease with heart failure: Secondary | ICD-10-CM | POA: Diagnosis not present

## 2015-09-25 DIAGNOSIS — Z7982 Long term (current) use of aspirin: Secondary | ICD-10-CM | POA: Diagnosis not present

## 2015-09-30 DIAGNOSIS — I110 Hypertensive heart disease with heart failure: Secondary | ICD-10-CM | POA: Diagnosis not present

## 2015-09-30 DIAGNOSIS — J45909 Unspecified asthma, uncomplicated: Secondary | ICD-10-CM | POA: Diagnosis not present

## 2015-09-30 DIAGNOSIS — Z7901 Long term (current) use of anticoagulants: Secondary | ICD-10-CM | POA: Diagnosis not present

## 2015-09-30 DIAGNOSIS — E039 Hypothyroidism, unspecified: Secondary | ICD-10-CM | POA: Diagnosis not present

## 2015-09-30 DIAGNOSIS — I4891 Unspecified atrial fibrillation: Secondary | ICD-10-CM | POA: Diagnosis not present

## 2015-09-30 DIAGNOSIS — S32811D Multiple fractures of pelvis with unstable disruption of pelvic ring, subsequent encounter for fracture with routine healing: Secondary | ICD-10-CM | POA: Diagnosis not present

## 2015-09-30 DIAGNOSIS — Z7982 Long term (current) use of aspirin: Secondary | ICD-10-CM | POA: Diagnosis not present

## 2015-09-30 DIAGNOSIS — S3210XD Unspecified fracture of sacrum, subsequent encounter for fracture with routine healing: Secondary | ICD-10-CM | POA: Diagnosis not present

## 2015-09-30 DIAGNOSIS — E785 Hyperlipidemia, unspecified: Secondary | ICD-10-CM | POA: Diagnosis not present

## 2015-09-30 DIAGNOSIS — Z9181 History of falling: Secondary | ICD-10-CM | POA: Diagnosis not present

## 2015-09-30 DIAGNOSIS — I2510 Atherosclerotic heart disease of native coronary artery without angina pectoris: Secondary | ICD-10-CM | POA: Diagnosis not present

## 2015-09-30 DIAGNOSIS — I5022 Chronic systolic (congestive) heart failure: Secondary | ICD-10-CM | POA: Diagnosis not present

## 2015-09-30 DIAGNOSIS — I350 Nonrheumatic aortic (valve) stenosis: Secondary | ICD-10-CM | POA: Diagnosis not present

## 2015-10-03 DIAGNOSIS — I350 Nonrheumatic aortic (valve) stenosis: Secondary | ICD-10-CM | POA: Diagnosis not present

## 2015-10-03 DIAGNOSIS — E785 Hyperlipidemia, unspecified: Secondary | ICD-10-CM | POA: Diagnosis not present

## 2015-10-03 DIAGNOSIS — I2510 Atherosclerotic heart disease of native coronary artery without angina pectoris: Secondary | ICD-10-CM | POA: Diagnosis not present

## 2015-10-03 DIAGNOSIS — J45909 Unspecified asthma, uncomplicated: Secondary | ICD-10-CM | POA: Diagnosis not present

## 2015-10-03 DIAGNOSIS — I4891 Unspecified atrial fibrillation: Secondary | ICD-10-CM | POA: Diagnosis not present

## 2015-10-03 DIAGNOSIS — I5022 Chronic systolic (congestive) heart failure: Secondary | ICD-10-CM | POA: Diagnosis not present

## 2015-10-03 DIAGNOSIS — S3210XD Unspecified fracture of sacrum, subsequent encounter for fracture with routine healing: Secondary | ICD-10-CM | POA: Diagnosis not present

## 2015-10-03 DIAGNOSIS — I110 Hypertensive heart disease with heart failure: Secondary | ICD-10-CM | POA: Diagnosis not present

## 2015-10-03 DIAGNOSIS — E039 Hypothyroidism, unspecified: Secondary | ICD-10-CM | POA: Diagnosis not present

## 2015-10-03 DIAGNOSIS — S32811D Multiple fractures of pelvis with unstable disruption of pelvic ring, subsequent encounter for fracture with routine healing: Secondary | ICD-10-CM | POA: Diagnosis not present

## 2015-10-03 DIAGNOSIS — Z7901 Long term (current) use of anticoagulants: Secondary | ICD-10-CM | POA: Diagnosis not present

## 2015-10-03 DIAGNOSIS — Z9181 History of falling: Secondary | ICD-10-CM | POA: Diagnosis not present

## 2015-10-03 DIAGNOSIS — Z7982 Long term (current) use of aspirin: Secondary | ICD-10-CM | POA: Diagnosis not present

## 2015-10-07 DIAGNOSIS — Z7901 Long term (current) use of anticoagulants: Secondary | ICD-10-CM | POA: Diagnosis not present

## 2015-10-07 DIAGNOSIS — I5022 Chronic systolic (congestive) heart failure: Secondary | ICD-10-CM | POA: Diagnosis not present

## 2015-10-07 DIAGNOSIS — E785 Hyperlipidemia, unspecified: Secondary | ICD-10-CM | POA: Diagnosis not present

## 2015-10-07 DIAGNOSIS — I2510 Atherosclerotic heart disease of native coronary artery without angina pectoris: Secondary | ICD-10-CM | POA: Diagnosis not present

## 2015-10-07 DIAGNOSIS — I350 Nonrheumatic aortic (valve) stenosis: Secondary | ICD-10-CM | POA: Diagnosis not present

## 2015-10-07 DIAGNOSIS — S32811D Multiple fractures of pelvis with unstable disruption of pelvic ring, subsequent encounter for fracture with routine healing: Secondary | ICD-10-CM | POA: Diagnosis not present

## 2015-10-07 DIAGNOSIS — Z7982 Long term (current) use of aspirin: Secondary | ICD-10-CM | POA: Diagnosis not present

## 2015-10-07 DIAGNOSIS — Z9181 History of falling: Secondary | ICD-10-CM | POA: Diagnosis not present

## 2015-10-07 DIAGNOSIS — I4891 Unspecified atrial fibrillation: Secondary | ICD-10-CM | POA: Diagnosis not present

## 2015-10-07 DIAGNOSIS — J45909 Unspecified asthma, uncomplicated: Secondary | ICD-10-CM | POA: Diagnosis not present

## 2015-10-07 DIAGNOSIS — I110 Hypertensive heart disease with heart failure: Secondary | ICD-10-CM | POA: Diagnosis not present

## 2015-10-07 DIAGNOSIS — E039 Hypothyroidism, unspecified: Secondary | ICD-10-CM | POA: Diagnosis not present

## 2015-10-07 DIAGNOSIS — S3210XD Unspecified fracture of sacrum, subsequent encounter for fracture with routine healing: Secondary | ICD-10-CM | POA: Diagnosis not present

## 2015-10-15 DIAGNOSIS — J449 Chronic obstructive pulmonary disease, unspecified: Secondary | ICD-10-CM | POA: Diagnosis not present

## 2015-10-15 DIAGNOSIS — I10 Essential (primary) hypertension: Secondary | ICD-10-CM | POA: Diagnosis not present

## 2015-10-15 DIAGNOSIS — M25569 Pain in unspecified knee: Secondary | ICD-10-CM | POA: Diagnosis not present

## 2015-10-15 DIAGNOSIS — R946 Abnormal results of thyroid function studies: Secondary | ICD-10-CM | POA: Diagnosis not present

## 2015-10-15 DIAGNOSIS — Z7901 Long term (current) use of anticoagulants: Secondary | ICD-10-CM | POA: Diagnosis not present

## 2015-10-23 DIAGNOSIS — Z7901 Long term (current) use of anticoagulants: Secondary | ICD-10-CM | POA: Diagnosis not present

## 2015-10-23 LAB — PROTIME-INR

## 2015-10-29 DIAGNOSIS — Z7901 Long term (current) use of anticoagulants: Secondary | ICD-10-CM | POA: Diagnosis not present

## 2015-10-29 LAB — PROTIME-INR

## 2015-10-31 HISTORY — PX: CHEST TUBE INSERTION: SHX231

## 2015-11-06 DIAGNOSIS — Z954 Presence of other heart-valve replacement: Secondary | ICD-10-CM | POA: Diagnosis not present

## 2015-11-06 LAB — PROTIME-INR

## 2015-11-13 DIAGNOSIS — Z7901 Long term (current) use of anticoagulants: Secondary | ICD-10-CM | POA: Diagnosis not present

## 2015-11-13 LAB — PROTIME-INR

## 2015-11-20 ENCOUNTER — Institutional Professional Consult (permissible substitution): Payer: Medicare Other | Admitting: Internal Medicine

## 2015-11-20 DIAGNOSIS — I480 Paroxysmal atrial fibrillation: Secondary | ICD-10-CM | POA: Diagnosis not present

## 2015-11-20 DIAGNOSIS — I5022 Chronic systolic (congestive) heart failure: Secondary | ICD-10-CM | POA: Diagnosis not present

## 2015-11-20 DIAGNOSIS — Z7901 Long term (current) use of anticoagulants: Secondary | ICD-10-CM | POA: Diagnosis not present

## 2015-11-20 DIAGNOSIS — Z952 Presence of prosthetic heart valve: Secondary | ICD-10-CM | POA: Diagnosis not present

## 2015-11-20 DIAGNOSIS — R0602 Shortness of breath: Secondary | ICD-10-CM | POA: Diagnosis not present

## 2015-11-27 DIAGNOSIS — Z7901 Long term (current) use of anticoagulants: Secondary | ICD-10-CM | POA: Diagnosis not present

## 2015-11-28 ENCOUNTER — Other Ambulatory Visit: Payer: Self-pay | Admitting: Nurse Practitioner

## 2015-11-28 ENCOUNTER — Ambulatory Visit
Admission: RE | Admit: 2015-11-28 | Discharge: 2015-11-28 | Disposition: A | Discharge status: Home / Self Care | Payer: Medicare Other | Source: Ambulatory Visit | Attending: Nurse Practitioner | Admitting: Nurse Practitioner

## 2015-11-28 ENCOUNTER — Inpatient Hospital Stay (HOSPITAL_COMMUNITY)
Admission: EM | Admit: 2015-11-28 | Discharge: 2015-12-09 | DRG: 164 | Disposition: A | Discharge status: Home / Self Care | Payer: Medicare Other | Attending: Thoracic Surgery (Cardiothoracic Vascular Surgery) | Admitting: Thoracic Surgery (Cardiothoracic Vascular Surgery)

## 2015-11-28 ENCOUNTER — Emergency Department (HOSPITAL_COMMUNITY): Payer: Medicare Other

## 2015-11-28 ENCOUNTER — Encounter (HOSPITAL_COMMUNITY): Payer: Self-pay

## 2015-11-28 DIAGNOSIS — I2510 Atherosclerotic heart disease of native coronary artery without angina pectoris: Secondary | ICD-10-CM | POA: Diagnosis not present

## 2015-11-28 DIAGNOSIS — R0600 Dyspnea, unspecified: Secondary | ICD-10-CM | POA: Diagnosis not present

## 2015-11-28 DIAGNOSIS — Z8673 Personal history of transient ischemic attack (TIA), and cerebral infarction without residual deficits: Secondary | ICD-10-CM

## 2015-11-28 DIAGNOSIS — Z4682 Encounter for fitting and adjustment of non-vascular catheter: Secondary | ICD-10-CM | POA: Diagnosis not present

## 2015-11-28 DIAGNOSIS — I4891 Unspecified atrial fibrillation: Secondary | ICD-10-CM | POA: Diagnosis present

## 2015-11-28 DIAGNOSIS — E785 Hyperlipidemia, unspecified: Secondary | ICD-10-CM | POA: Diagnosis not present

## 2015-11-28 DIAGNOSIS — J9382 Other air leak: Secondary | ICD-10-CM | POA: Diagnosis not present

## 2015-11-28 DIAGNOSIS — K5900 Constipation, unspecified: Secondary | ICD-10-CM | POA: Diagnosis not present

## 2015-11-28 DIAGNOSIS — Z87891 Personal history of nicotine dependence: Secondary | ICD-10-CM | POA: Diagnosis not present

## 2015-11-28 DIAGNOSIS — I110 Hypertensive heart disease with heart failure: Secondary | ICD-10-CM | POA: Diagnosis not present

## 2015-11-28 DIAGNOSIS — M1990 Unspecified osteoarthritis, unspecified site: Secondary | ICD-10-CM | POA: Diagnosis not present

## 2015-11-28 DIAGNOSIS — J439 Emphysema, unspecified: Secondary | ICD-10-CM | POA: Diagnosis not present

## 2015-11-28 DIAGNOSIS — R05 Cough: Secondary | ICD-10-CM | POA: Diagnosis not present

## 2015-11-28 DIAGNOSIS — J9383 Other pneumothorax: Secondary | ICD-10-CM | POA: Diagnosis not present

## 2015-11-28 DIAGNOSIS — R0602 Shortness of breath: Secondary | ICD-10-CM | POA: Diagnosis not present

## 2015-11-28 DIAGNOSIS — I5022 Chronic systolic (congestive) heart failure: Secondary | ICD-10-CM | POA: Diagnosis not present

## 2015-11-28 DIAGNOSIS — E0520 Thyrotoxicosis with toxic multinodular goiter without thyrotoxic crisis or storm: Secondary | ICD-10-CM | POA: Diagnosis not present

## 2015-11-28 DIAGNOSIS — Z953 Presence of xenogenic heart valve: Secondary | ICD-10-CM | POA: Diagnosis not present

## 2015-11-28 DIAGNOSIS — J45909 Unspecified asthma, uncomplicated: Secondary | ICD-10-CM | POA: Diagnosis not present

## 2015-11-28 DIAGNOSIS — R069 Unspecified abnormalities of breathing: Secondary | ICD-10-CM | POA: Diagnosis not present

## 2015-11-28 DIAGNOSIS — K219 Gastro-esophageal reflux disease without esophagitis: Secondary | ICD-10-CM | POA: Diagnosis not present

## 2015-11-28 DIAGNOSIS — D6959 Other secondary thrombocytopenia: Secondary | ICD-10-CM | POA: Diagnosis not present

## 2015-11-28 DIAGNOSIS — R079 Chest pain, unspecified: Secondary | ICD-10-CM | POA: Diagnosis not present

## 2015-11-28 DIAGNOSIS — Z9689 Presence of other specified functional implants: Secondary | ICD-10-CM

## 2015-11-28 DIAGNOSIS — J948 Other specified pleural conditions: Secondary | ICD-10-CM | POA: Diagnosis not present

## 2015-11-28 DIAGNOSIS — Z09 Encounter for follow-up examination after completed treatment for conditions other than malignant neoplasm: Secondary | ICD-10-CM

## 2015-11-28 DIAGNOSIS — I509 Heart failure, unspecified: Secondary | ICD-10-CM | POA: Diagnosis not present

## 2015-11-28 DIAGNOSIS — J9811 Atelectasis: Secondary | ICD-10-CM | POA: Diagnosis not present

## 2015-11-28 DIAGNOSIS — R269 Unspecified abnormalities of gait and mobility: Secondary | ICD-10-CM | POA: Diagnosis not present

## 2015-11-28 DIAGNOSIS — J449 Chronic obstructive pulmonary disease, unspecified: Secondary | ICD-10-CM | POA: Diagnosis not present

## 2015-11-28 DIAGNOSIS — J939 Pneumothorax, unspecified: Secondary | ICD-10-CM

## 2015-11-28 DIAGNOSIS — J9311 Primary spontaneous pneumothorax: Secondary | ICD-10-CM | POA: Diagnosis not present

## 2015-11-28 HISTORY — DX: Other pneumothorax: J93.83

## 2015-11-28 LAB — CBC WITH DIFFERENTIAL/PLATELET
Basophils Absolute: 0 10*3/uL (ref 0.0–0.1)
Basophils Relative: 0 %
Eosinophils Absolute: 0.2 10*3/uL (ref 0.0–0.7)
Eosinophils Relative: 3 %
HEMATOCRIT: 44.3 % (ref 36.0–46.0)
HEMOGLOBIN: 14.6 g/dL (ref 12.0–15.0)
LYMPHS ABS: 2.1 10*3/uL (ref 0.7–4.0)
Lymphocytes Relative: 32 %
MCH: 31.7 pg (ref 26.0–34.0)
MCHC: 33 g/dL (ref 30.0–36.0)
MCV: 96.3 fL (ref 78.0–100.0)
MONOS PCT: 8 %
Monocytes Absolute: 0.5 10*3/uL (ref 0.1–1.0)
NEUTROS ABS: 3.7 10*3/uL (ref 1.7–7.7)
NEUTROS PCT: 57 %
Platelets: 139 10*3/uL — ABNORMAL LOW (ref 150–400)
RBC: 4.6 MIL/uL (ref 3.87–5.11)
RDW: 13.4 % (ref 11.5–15.5)
WBC: 6.5 10*3/uL (ref 4.0–10.5)

## 2015-11-28 LAB — COMPREHENSIVE METABOLIC PANEL
ALK PHOS: 112 U/L (ref 38–126)
ALT: 15 U/L (ref 14–54)
ANION GAP: 8 (ref 5–15)
AST: 22 U/L (ref 15–41)
Albumin: 4.1 g/dL (ref 3.5–5.0)
BILIRUBIN TOTAL: 0.8 mg/dL (ref 0.3–1.2)
BUN: 12 mg/dL (ref 6–20)
CALCIUM: 10.2 mg/dL (ref 8.9–10.3)
CO2: 21 mmol/L — ABNORMAL LOW (ref 22–32)
Chloride: 110 mmol/L (ref 101–111)
Creatinine, Ser: 1.17 mg/dL — ABNORMAL HIGH (ref 0.44–1.00)
GFR calc non Af Amer: 46 mL/min — ABNORMAL LOW (ref >60)
GFR, EST AFRICAN AMERICAN: 54 mL/min — AB (ref >60)
Glucose, Bld: 103 mg/dL — ABNORMAL HIGH (ref 65–99)
Potassium: 4.6 mmol/L (ref 3.5–5.1)
Sodium: 139 mmol/L (ref 135–145)
TOTAL PROTEIN: 7.2 g/dL (ref 6.5–8.1)

## 2015-11-28 LAB — PROTIME-INR
INR: 3.12
Prothrombin Time: 32.8 seconds — ABNORMAL HIGH (ref 11.4–15.2)

## 2015-11-28 MED ORDER — WARFARIN SODIUM 5 MG PO TABS
5.0000 mg | ORAL_TABLET | Freq: Every day | ORAL | Status: DC
  Administered 2015-11-28 – 2015-11-29 (×2): 5 mg via ORAL
  Filled 2015-11-28 (×2): qty 1

## 2015-11-28 MED ORDER — CARVEDILOL 6.25 MG PO TABS
18.7500 mg | ORAL_TABLET | Freq: Two times a day (BID) | ORAL | Status: DC
  Administered 2015-11-29 – 2015-12-03 (×9): 18.75 mg via ORAL
  Filled 2015-11-28 (×9): qty 1

## 2015-11-28 MED ORDER — FENTANYL 40 MCG/ML IV SOLN
INTRAVENOUS | Status: DC
  Administered 2015-11-28: 20 ug via INTRAVENOUS
  Administered 2015-11-28: 21:00:00 via INTRAVENOUS
  Administered 2015-11-28: 20 ug via INTRAVENOUS
  Administered 2015-11-29: 190 ug via INTRAVENOUS
  Administered 2015-11-29: 90 ug via INTRAVENOUS
  Administered 2015-11-29: 250 ug via INTRAVENOUS
  Administered 2015-11-29: 200 ug via INTRAVENOUS
  Administered 2015-11-29: 19:00:00 via INTRAVENOUS
  Administered 2015-11-29: 57 ug via INTRAVENOUS
  Administered 2015-11-29: 70 ug via INTRAVENOUS
  Administered 2015-11-30: 140 ug via INTRAVENOUS
  Administered 2015-11-30: 20:00:00 via INTRAVENOUS
  Administered 2015-11-30: 150 ug via INTRAVENOUS
  Administered 2015-11-30: 100 ug via INTRAVENOUS
  Administered 2015-11-30: 220 ug via INTRAVENOUS
  Administered 2015-11-30: 90 ug via INTRAVENOUS
  Administered 2015-12-01: 120 ug via INTRAVENOUS
  Administered 2015-12-01: 110 ug via INTRAVENOUS
  Administered 2015-12-01: 10 ug via INTRAVENOUS
  Administered 2015-12-01: 50 ug via INTRAVENOUS
  Administered 2015-12-01: 80 ug via INTRAVENOUS
  Administered 2015-12-02: 40 ug via INTRAVENOUS
  Administered 2015-12-02: 12:00:00 via INTRAVENOUS
  Administered 2015-12-02: 40 ug via INTRAVENOUS
  Administered 2015-12-02: 70 ug via INTRAVENOUS
  Administered 2015-12-03: 40 ug via INTRAVENOUS
  Administered 2015-12-03: 14:00:00 via INTRAVENOUS
  Administered 2015-12-03: 40 ug via INTRAVENOUS
  Filled 2015-11-28 (×4): qty 25

## 2015-11-28 MED ORDER — ATORVASTATIN CALCIUM 40 MG PO TABS
40.0000 mg | ORAL_TABLET | Freq: Every day | ORAL | Status: DC
  Administered 2015-11-29 – 2015-12-02 (×4): 40 mg via ORAL
  Filled 2015-11-28 (×4): qty 1

## 2015-11-28 MED ORDER — ONDANSETRON HCL 4 MG/2ML IJ SOLN
4.0000 mg | Freq: Once | INTRAMUSCULAR | Status: AC
  Administered 2015-11-28: 4 mg via INTRAVENOUS
  Filled 2015-11-28: qty 2

## 2015-11-28 MED ORDER — FAMOTIDINE 20 MG PO TABS
20.0000 mg | ORAL_TABLET | Freq: Every day | ORAL | Status: DC
  Administered 2015-11-29 – 2015-12-02 (×4): 20 mg via ORAL
  Filled 2015-11-28 (×4): qty 1

## 2015-11-28 MED ORDER — SODIUM CHLORIDE 0.9% FLUSH: 9.0000 mL | INTRAVENOUS | Status: DC | PRN

## 2015-11-28 MED ORDER — ASPIRIN EC 81 MG PO TBEC
81.0000 mg | DELAYED_RELEASE_TABLET | Freq: Every day | ORAL | Status: DC
  Administered 2015-11-29 – 2015-12-02 (×4): 81 mg via ORAL
  Filled 2015-11-28 (×4): qty 1

## 2015-11-28 MED ORDER — DIPHENHYDRAMINE HCL 50 MG/ML IJ SOLN: 12.5000 mg | Freq: Four times a day (QID) | INTRAMUSCULAR | Status: DC | PRN

## 2015-11-28 MED ORDER — SENNOSIDES-DOCUSATE SODIUM 8.6-50 MG PO TABS
1.0000 | ORAL_TABLET | Freq: Two times a day (BID) | ORAL | Status: DC
  Administered 2015-11-28 – 2015-12-02 (×8): 1 via ORAL
  Filled 2015-11-28 (×7): qty 1

## 2015-11-28 MED ORDER — MIDAZOLAM HCL 2 MG/2ML IJ SOLN
2.0000 mg | Freq: Once | INTRAMUSCULAR | Status: AC
  Administered 2015-11-28: 2 mg via INTRAVENOUS
  Filled 2015-11-28: qty 2

## 2015-11-28 MED ORDER — LIDOCAINE HCL (PF) 1 % IJ SOLN
INTRAMUSCULAR | Status: AC
  Filled 2015-11-28: qty 30

## 2015-11-28 MED ORDER — DIPHENHYDRAMINE HCL 12.5 MG/5ML PO ELIX
12.5000 mg | ORAL_SOLUTION | Freq: Four times a day (QID) | ORAL | Status: DC | PRN
  Administered 2015-11-29 – 2015-12-01 (×3): 12.5 mg via ORAL
  Filled 2015-11-28 (×4): qty 10

## 2015-11-28 MED ORDER — ONDANSETRON HCL 4 MG/2ML IJ SOLN
4.0000 mg | Freq: Four times a day (QID) | INTRAMUSCULAR | Status: DC | PRN
  Filled 2015-11-28: qty 2

## 2015-11-28 MED ORDER — SODIUM CHLORIDE 0.9% FLUSH
3.0000 mL | Freq: Two times a day (BID) | INTRAVENOUS | Status: DC
  Administered 2015-11-30: 3 mL via INTRAVENOUS

## 2015-11-28 MED ORDER — ALBUTEROL SULFATE HFA 108 (90 BASE) MCG/ACT IN AERS: 2.0000 | INHALATION_SPRAY | Freq: Four times a day (QID) | RESPIRATORY_TRACT | Status: DC | PRN

## 2015-11-28 MED ORDER — FENTANYL CITRATE (PF) 100 MCG/2ML IJ SOLN
50.0000 ug | Freq: Once | INTRAMUSCULAR | Status: AC
  Administered 2015-11-28: 50 ug via INTRAVENOUS
  Filled 2015-11-28: qty 2

## 2015-11-28 MED ORDER — WARFARIN - PHYSICIAN DOSING INPATIENT: Freq: Every day | Status: DC

## 2015-11-28 MED ORDER — METHIMAZOLE 10 MG PO TABS
15.0000 mg | ORAL_TABLET | Freq: Every evening | ORAL | Status: DC
  Administered 2015-11-28 – 2015-12-02 (×5): 15 mg via ORAL
  Filled 2015-11-28 (×5): qty 1

## 2015-11-28 MED ORDER — ALBUTEROL SULFATE (2.5 MG/3ML) 0.083% IN NEBU: 2.5000 mg | INHALATION_SOLUTION | Freq: Four times a day (QID) | RESPIRATORY_TRACT | Status: DC | PRN

## 2015-11-28 MED ORDER — MOMETASONE FURO-FORMOTEROL FUM 200-5 MCG/ACT IN AERO
2.0000 | INHALATION_SPRAY | Freq: Two times a day (BID) | RESPIRATORY_TRACT | Status: DC
  Administered 2015-11-29 – 2015-12-02 (×7): 2 via RESPIRATORY_TRACT
  Filled 2015-11-28: qty 8.8

## 2015-11-28 MED ORDER — MORPHINE SULFATE (PF) 2 MG/ML IV SOLN
2.0000 mg | Freq: Once | INTRAVENOUS | Status: AC
  Administered 2015-11-28: 2 mg via INTRAVENOUS
  Filled 2015-11-28: qty 1

## 2015-11-28 MED ORDER — WARFARIN SODIUM 5 MG PO TABS: 5.0000 mg | ORAL_TABLET | ORAL | Status: DC

## 2015-11-28 MED ORDER — NALOXONE HCL 0.4 MG/ML IJ SOLN: 0.4000 mg | INTRAMUSCULAR | Status: DC | PRN

## 2015-11-28 NOTE — ED Notes (Signed)
Hendrickson MD at bedside discussing procedure with pt.  Consent signed at this time.

## 2015-11-28 NOTE — ED Notes (Signed)
[facility] returned page regarding PRN pain medication and reports that they will get in touch with someone soon.

## 2015-11-28 NOTE — ED Provider Notes (Signed)
[place] DEPT Provider Note   CSN: [id number] Arrival date & time: 11/28/15  1351     History   Chief Complaint Chief Complaint  Patient presents with  . Chest Injury    HPI Danielle [name] is a 70 y.o. female who Presents to the emergency department with chief complaint of shortness of breath. She has  has a past medical history of Aortic stenosis; Arthritis; Asthma; Chronic systolic CHF (congestive heart failure) ([place]); Coronary artery disease; Gastritis; Hyperlipidemia; Hypertension; Hyperthyroidism; Multiple thyroid nodules; Pelvis fracture ([place]) (08/19/2015); and Stroke [facility]). She was has a previous history of previous spontaneous pneumothorax after a Open chest surgery for valve replacement.. The patient states that she's had a cough and wheezing over the past couple days. This morning she had sudden onset significant shortness of breath, which felt "exactly the same as the previous pneumothorax." Her PCP ordered an outpatient chest x-ray. Patient was contacted because of a very large left pneumothorax sent to the ED. Patient complains of shortness of breath. She is intermittently hypoxic, but compensating well. The x-ray reads that she is only tension. However, trachea is midline. HPI  Past Medical History:  Diagnosis Date  . Aortic stenosis    Status post bioprosthetic AVR  . Arthritis    DJD  . Asthma   . Chronic systolic CHF (congestive heart failure) (H[facility])    EF 30% 04/2014  . Coronary artery disease   . Gastritis   . Hyperlipidemia   . Hypertension   . Hyperthyroidism   . Multiple thyroid nodules   . Pelvis fracture ([place]) 08/19/2015   MULTIPLE   . Stroke [facility])     Patient Active Problem List   Diagnosis Date Noted  . Pelvic fracture ([place]) 08/19/2015  . Fall at home 08/19/2015  . Hypertension 08/19/2015  . Hyperlipidemia 08/19/2015  . Asthma 08/19/2015  . Atrial fibrillation ([place]) 08/19/2015  . Hyperthyroidism 08/19/2015  . Chronic systolic  CHF (congestive heart failure) ([place])   . Coronary artery disease   . Aortic stenosis     Past Surgical History:  Procedure Laterality Date  . VALVE REPLACEMENT      OB History    No data available       Home Medications    Prior to Admission medications   Medication Sig Start Date End Date Taking? Authorizing Provider  acetaminophen (TYLENOL) 500 MG tablet Take 500-1,000 mg by mouth every 8 (eight) hours as needed for mild pain, moderate pain or headache.  12/10/14  Yes Historical Provider, MD  albuterol (PROAIR HFA) 108 (90 Base) MCG/ACT inhaler Inhale 2 puffs into the lungs every 6 (six) hours as needed for wheezing or shortness of breath.  12/11/14  Yes Historical Provider, MD  aspirin EC 81 MG tablet Take 81 mg by mouth daily. 12/10/14  Yes Historical Provider, MD  atorvastatin (LIPITOR) 40 MG tablet Take 40 mg by mouth at bedtime. 02/21/15  Yes Historical Provider, MD  budesonide-formoterol (SYMBICORT) 160-4.5 MCG/ACT inhaler Inhale 2 puffs into the lungs 2 (two) times daily. 03/27/15  Yes Historical Provider, MD  carvedilol (COREG) 25 MG tablet Take 25 mg by mouth 2 (two) times daily with a meal.   Yes Historical Provider, MD  methimazole (TAPAZOLE) 5 MG tablet Take 15 mg by mouth every evening. 12/17/14  Yes Historical Provider, MD  ranitidine (ZANTAC) 150 MG tablet Take 150 mg by mouth 2 (two) times daily. 12/10/14  Yes Historical Provider, MD  senna-docusate (SENOKOT-S) 8.6-50 MG tablet Take 1 tablet by  mouth 2 (two) times daily.    Yes Historical Provider, MD  traMADol (ULTRAM) 50 MG tablet Take 1 tablet (50 mg total) by mouth every 6 (six) hours as needed for moderate pain. 08/20/15  Yes Albertine Patricia, MD  warfarin (COUMADIN) 5 MG tablet Take 1-1.5 tablets (5-7.5 mg total) by mouth See admin instructions. Please hold warfarin on 6/22, 6/23, resume as previously done on 6/24  5 mg in the evening on Sun/Tues/Thurs/Sat and 7.5 mg on Mon/Wed/Fri 08/23/15  Yes Dawood S  Elgergawy, MD  warfarin (COUMADIN) 7.5 MG tablet Take 5-7.5 mg by mouth See admin instructions. 7.5 mg only on Monday's all there days 5 mg   Yes Historical Provider, MD  carvedilol (COREG) 6.25 MG tablet Take 18.75 mg by mouth 2 (two) times daily with a meal.     Historical Provider, MD  valACYclovir (VALTREX) 1000 MG tablet Take 1 tablet (1,000 mg total) by mouth 2 (two) times daily. Patient not taking: Reported on 11/28/2015 08/20/15   Albertine Patricia, MD    Family History Family History  Problem Relation Age of Onset  . Diabetes Mother   . Heart attack Mother   . Diabetes Father   . Lung cancer Father   . Diabetes Sister   . Diabetes Sister   . HIV Brother     Social History Social History  Substance Use Topics  . Smoking status: Former Smoker    Quit date: 03/01/2010  . Smokeless tobacco: Never Used  . Alcohol use No     Allergies   Tetanus toxoid adsorbed   Review of Systems Review of Systems   Physical Exam Updated Vital Signs BP 133/84   Pulse 94   Resp 24   Ht 5\' 7"  (1.702 m)   Wt 95.3 kg   SpO2 96%   BMI 32.89 kg/m   Physical Exam  Constitutional: She is oriented to person, place, and time. She appears well-developed and well-nourished. No distress.  HENT:  Head: Normocephalic and atraumatic.  Eyes: Conjunctivae are normal. No scleral icterus.  Neck: Normal range of motion. JVD (On the left only) present.  Cardiovascular: Normal rate, regular rhythm and normal heart sounds.  Exam reveals no gallop and no friction rub.   No murmur heard. Pulmonary/Chest: Effort normal. No respiratory distress.    Abdominal: Soft. Bowel sounds are normal. She exhibits no distension and no mass. There is no tenderness. There is no guarding.  Neurological: She is alert and oriented to person, place, and time.  Skin: Skin is warm and dry. She is not diaphoretic.     ED Treatments / Results  Labs (all labs ordered are listed, but only abnormal results are  displayed) Labs Reviewed  CBC WITH DIFFERENTIAL/PLATELET - Abnormal; Notable for the following:       Result Value   Platelets 139 (*)    All other components within normal limits  COMPREHENSIVE METABOLIC PANEL - Abnormal; Notable for the following:    CO2 21 (*)    Glucose, Bld 103 (*)    Creatinine, Ser 1.17 (*)    GFR calc non Af Amer 46 (*)    GFR calc Af Amer 54 (*)    All other components within normal limits  CBC WITH DIFFERENTIAL/PLATELET  PROTIME-INR    EKG  EKG Interpretation  Date/Time:  Friday November 28 2015 14:00:55 EDT Ventricular Rate:  93 PR Interval:    QRS Duration: 94 QT Interval:  364 QTC Calculation: 453 R Axis:  86 Text Interpretation:  Sinus rhythm Ventricular premature complex Consider right atrial enlargement Inferior infarct, old Consider anterolateral infarct Abnormal ekg Confirmed by Audie Pinto  MD, ROBERT ([id number]) on 11/28/2015 3:31:32 PM       Radiology Dg Chest 2 View  Addendum Date: 11/28/2015   ADDENDUM REPORT: 11/28/2015 14:09 ADDENDUM: These results were discussed by telephone on 11/28/2015 at 2:08 pm with Ms Laurance Flatten, NP, who verbally acknowledged the results. Signed, Dulcy Fanny. Earleen Newport, DO [facility] Radiology Electronically Signed   By: Corrie Mckusick D.O.   On: 11/28/2015 14:09   Result Date: 11/28/2015 CLINICAL DATA:  70 year old female with shortness of breath and wheezing. EXAM: CHEST  2 VIEW COMPARISON:  None. FINDINGS: Large left pneumothorax with mild deviation of the trachea from left to right. Using Collins method  % = 4.2 + 4.7 * (A+B+C) where A =5.7cm, B =7.4cm, C = 6.5cm  percentage is estimated 96%. Interstitial opacities of the right lung with surgical staple line of the lateral right lung. Surgical changes of prior median sternotomy and aortic valve repair. No pleural effusion. No displaced fracture.  Degenerative changes of the spine. IMPRESSION: Large left pneumothorax with evidence of  early tension. Surgical changes of median sternotomy and aortic valve replacement. The patient is currently being sent to the emergency department from outpatient imaging center. Attempts to contact the referring physician by phone, with message left with the answering service during closed office hours. Awaiting return phone call, at which time addendum can be made. Signed, Dulcy Fanny. Earleen Newport, DO [facility] Radiology Electronically Signed: By: Corrie Mckusick D.O. On: 11/28/2015 13:37    Procedures .Critical Care Performed by: Margarita Mail Authorized by: Margarita Mail   Critical care provider statement:    Critical care time (minutes):  45   Critical care was necessary to treat or prevent imminent or life-threatening deterioration of the following conditions:  Respiratory failure   Critical care was time spent personally by me on the following activities:  Development of treatment plan with patient or surrogate, discussions with consultants, evaluation of patient's response to treatment, examination of patient, interpretation of cardiac output measurements, obtaining history from patient or surrogate, ordering and performing treatments and interventions, ordering and review of laboratory studies, ordering and review of radiographic studies, pulse oximetry, re-evaluation of patient's condition and review of old charts   (including critical care time)  Medications Ordered in ED Medications  lidocaine (PF) (XYLOCAINE) 1 % injection (not administered)  fentaNYL (SUBLIMAZE) injection 50 mcg (50 mcg Intravenous Given 11/28/15 1509)  ondansetron (ZOFRAN) injection 4 mg (4 mg Intravenous Given 11/28/15 1509)  midazolam (VERSED) injection 2 mg (2 mg Intravenous Given 11/28/15 1616)     Initial Impression / Assessment and Plan / ED Course  I have reviewed the triage vital signs and the nursing notes.  Pertinent labs & imaging results that were  available during my care of the patient were reviewed by me and considered in my medical decision making (see chart for details).  Clinical Course  Comment By Time  Patient compensating well. Speaking in full sentences, appears comfortable. Margarita Mail, PA-C 09/29 1453  Patient continues to speak in full sentences. - she appears slightly more uncomfortable. Trachea is pushing slightly to the right Margarita Mail, PA-C 09/29 1520  Patient having chest tube placed by CTS. Dr Roxan Hockey will admit the patient.  Margarita Mail, PA-C 09/29 1629      Final Clinical Impressions(s) / ED  Diagnoses   Final diagnoses:  Pneumothorax, left    New Prescriptions New Prescriptions   No medications on file     Margarita Mail, PA-C 11/28/15 [place], MD 11/29/15 [phone]

## 2015-11-28 NOTE — H&P (Signed)
Danielle [name] is an 70 y.o. female.   Chief Complaint: shortness of breath HPI: 70 yo woman with history of AS, s/p AVR in [place], CAD, chronic systolic heart failure, atrial fibrillation, stroke, asthma and right spontaneous pneumothorax. She noted left sided CP and SOB yesterday morning after a coughing spell. She presented today to ED. CXR showed a complete left pneumothorax. Pain has eased off but still short of breath.  Past Medical History:  Diagnosis Date  . Aortic stenosis    Status post bioprosthetic AVR  . Arthritis    DJD  . Asthma   . Chronic systolic CHF (congestive heart failure) (HCC)    EF 30% 04/2014  . Coronary artery disease   . Gastritis   . Hyperlipidemia   . Hypertension   . Hyperthyroidism   . Multiple thyroid nodules   . Pelvis fracture ([place]) 08/19/2015   MULTIPLE   . Stroke [facility])     Past Surgical History:  Procedure Laterality Date  . VALVE REPLACEMENT      Family History  Problem Relation Age of Onset  . Diabetes Mother   . Heart attack Mother   . Diabetes Father   . Lung cancer Father   . Diabetes Sister   . Diabetes Sister   . HIV Brother    Social History:  reports that she quit smoking about 5 years ago. She has never used smokeless tobacco. She reports that she does not drink alcohol or use drugs.  Allergies:  Allergies  Allergen Reactions  . Tetanus Toxoid Adsorbed Swelling    Unknown      (Not in a hospital admission)  Results for orders placed or performed during the hospital encounter of 11/28/15 (from the past 48 hour(s))  CBC with Differential     Status: Abnormal   Collection Time: 11/28/15  2:15 PM  Result Value Ref Range   WBC 6.5 4.0 - 10.5 K/uL   RBC 4.60 3.87 - 5.11 MIL/uL   Hemoglobin 14.6 12.0 - 15.0 g/dL   HCT 44.3 36.0 - 46.0 %   MCV 96.3 78.0 - 100.0 fL   MCH 31.7 26.0 - 34.0 pg   MCHC 33.0 30.0 - 36.0 g/dL   RDW 13.4 11.5 - 15.5 %   Platelets 139 (L) 150 - 400 K/uL   Neutrophils Relative %  57 %   Neutro Abs 3.7 1.7 - 7.7 K/uL   Lymphocytes Relative 32 %   Lymphs Abs 2.1 0.7 - 4.0 K/uL   Monocytes Relative 8 %   Monocytes Absolute 0.5 0.1 - 1.0 K/uL   Eosinophils Relative 3 %   Eosinophils Absolute 0.2 0.0 - 0.7 K/uL   Basophils Relative 0 %   Basophils Absolute 0.0 0.0 - 0.1 K/uL  Comprehensive metabolic panel     Status: Abnormal   Collection Time: 11/28/15  2:15 PM  Result Value Ref Range   Sodium 139 135 - 145 mmol/L   Potassium 4.6 3.5 - 5.1 mmol/L   Chloride 110 101 - 111 mmol/L   CO2 21 (L) 22 - 32 mmol/L   Glucose, Bld 103 (H) 65 - 99 mg/dL   BUN 12 6 - 20 mg/dL   Creatinine, Ser 1.17 (H) 0.44 - 1.00 mg/dL   Calcium 10.2 8.9 - 10.3 mg/dL   Total Protein 7.2 6.5 - 8.1 g/dL   Albumin 4.1 3.5 - 5.0 g/dL   AST 22 15 - 41 U/L   ALT 15 14 - 54 U/L   Alkaline  Phosphatase 112 38 - 126 U/L   Total Bilirubin 0.8 0.3 - 1.2 mg/dL   GFR calc non Af Amer 46 (L) >60 mL/min   GFR calc Af Amer 54 (L) >60 mL/min    Comment: (NOTE) The eGFR has been calculated using the CKD EPI equation. This calculation has not been validated in all clinical situations. eGFR's persistently <60 mL/min signify possible Chronic Kidney Disease.    Anion gap 8 5 - 15   Dg Chest 2 View  Addendum Date: 11/28/2015   ADDENDUM REPORT: 11/28/2015 14:09 ADDENDUM: These results were discussed by telephone on 11/28/2015 at 2:08 pm with Ms Laurance Flatten, NP, who verbally acknowledged the results. Signed, Dulcy Fanny. Earleen Newport, DO [facility] Radiology Electronically Signed   By: Corrie Mckusick D.O.   On: 11/28/2015 14:09   Result Date: 11/28/2015 CLINICAL DATA:  70 year old female with shortness of breath and wheezing. EXAM: CHEST  2 VIEW COMPARISON:  None. FINDINGS: Large left pneumothorax with mild deviation of the trachea from left to right. Using Collins method  % = 4.2 + 4.7 * (A+B+C) where A =5.7cm, B =7.4cm, C = 6.5cm  percentage is estimated 96%. Interstitial  opacities of the right lung with surgical staple line of the lateral right lung. Surgical changes of prior median sternotomy and aortic valve repair. No pleural effusion. No displaced fracture.  Degenerative changes of the spine. IMPRESSION: Large left pneumothorax with evidence of early tension. Surgical changes of median sternotomy and aortic valve replacement. The patient is currently being sent to the emergency department from outpatient imaging center. Attempts to contact the referring physician by phone, with message left with the answering service during closed office hours. Awaiting return phone call, at which time addendum can be made. Signed, Dulcy Fanny. Earleen Newport, DO [facility] Radiology Electronically Signed: By: Corrie Mckusick D.O. On: 11/28/2015 13:37    Review of Systems  Constitutional: Positive for chills. Negative for fever.  Respiratory: Positive for shortness of breath and wheezing.   Cardiovascular: Positive for chest pain (left sided), orthopnea and leg swelling.    Blood pressure 133/84, pulse 94, resp. rate 24, height 5' 7" (1.702 m), weight 210 lb (95.3 kg), SpO2 96 %. Physical Exam  Vitals reviewed. Constitutional: She is oriented to person, place, and time. She appears distressed (increased WOB, tachypneic).  Morbidly obese  HENT:  Head: Normocephalic and atraumatic.  Mouth/Throat: No oropharyngeal exudate.  Eyes: Conjunctivae and EOM are normal. No scleral icterus.  Neck: Neck supple. No tracheal deviation present. No thyromegaly present.  Cardiovascular:  No murmur heard. irregular  Respiratory:  Absent BS on left  GI: Soft. She exhibits no distension. There is no tenderness.  Musculoskeletal: She exhibits no edema.  Lymphadenopathy:    She has no cervical adenopathy.  Neurological: She is alert and oriented to person, place, and time. No cranial nerve deficit. She exhibits normal muscle tone.  Skin: Skin is warm and  dry.     Assessment/Plan 70 yo woman with multiple medical problems including chronic anticoagulation with coumadin who presents with 24 hours of CP and shortness of breath. CXR shows a 100 % left pneumothorax.  She needs CT placement for re-expansion of left lung.  The indications, risks, benefits and alternatives were discussed with the patient. She understands risks include but are not limited to bleeding, infection, tube malposition. She accepts the risks and agrees to proceed.  Will admit to telemetry    [place]  Roxan Hockey, MD 11/28/2015, 4:47 PM

## 2015-11-28 NOTE — Procedures (Signed)
After informed consent obtained, patient given 2 mg of versed. Had already received fentanyl IV.  Sterile technique.25 ml of 1% lidocaine for local anesthesia.  24 F CT placed left chest. + rush of air with chest entry. Connected to pleuravac- + air leak initially.  Some pain with lung re-expansion.  CXR pending  Revonda Standard. Roxan Hockey, MD [facility] [phone]

## 2015-11-28 NOTE — ED Triage Notes (Signed)
Pt reports a cold with dyspnea that began yesterday.  Pt was sent by PMD for xray at imaging center where she was discovered to have a left pneumothorax.  Pt reports dyspnea with exertion and reports she has had pneumothorax in the past.  Pt denies any pain and is in NAD at this time.

## 2015-11-28 NOTE — ED Notes (Signed)
Portable at bedside 

## 2015-11-29 ENCOUNTER — Inpatient Hospital Stay (HOSPITAL_COMMUNITY): Payer: Medicare Other

## 2015-11-29 DIAGNOSIS — J9311 Primary spontaneous pneumothorax: Secondary | ICD-10-CM

## 2015-11-29 LAB — CBC
HEMATOCRIT: 42.2 % (ref 36.0–46.0)
HEMOGLOBIN: 13.4 g/dL (ref 12.0–15.0)
MCH: 31.1 pg (ref 26.0–34.0)
MCHC: 31.8 g/dL (ref 30.0–36.0)
MCV: 97.9 fL (ref 78.0–100.0)
Platelets: 115 10*3/uL — ABNORMAL LOW (ref 150–400)
RBC: 4.31 MIL/uL (ref 3.87–5.11)
RDW: 13.4 % (ref 11.5–15.5)
WBC: 7.9 10*3/uL (ref 4.0–10.5)

## 2015-11-29 LAB — BASIC METABOLIC PANEL
ANION GAP: 9 (ref 5–15)
BUN: 15 mg/dL (ref 6–20)
CHLORIDE: 111 mmol/L (ref 101–111)
CO2: 20 mmol/L — ABNORMAL LOW (ref 22–32)
Calcium: 9.7 mg/dL (ref 8.9–10.3)
Creatinine, Ser: 1.08 mg/dL — ABNORMAL HIGH (ref 0.44–1.00)
GFR calc Af Amer: 59 mL/min — ABNORMAL LOW (ref >60)
GFR, EST NON AFRICAN AMERICAN: 51 mL/min — AB (ref >60)
GLUCOSE: 94 mg/dL (ref 65–99)
POTASSIUM: 4.7 mmol/L (ref 3.5–5.1)
Sodium: 140 mmol/L (ref 135–145)

## 2015-11-29 MED ORDER — KETOROLAC TROMETHAMINE 30 MG/ML IJ SOLN
30.0000 mg | Freq: Once | INTRAMUSCULAR | Status: AC
  Administered 2015-11-29: 30 mg via INTRAVENOUS
  Filled 2015-11-29: qty 1

## 2015-11-29 MED ORDER — ACETAMINOPHEN 325 MG PO TABS
650.0000 mg | ORAL_TABLET | Freq: Four times a day (QID) | ORAL | Status: DC
  Administered 2015-11-29 – 2015-12-03 (×15): 650 mg via ORAL
  Filled 2015-11-29 (×15): qty 2

## 2015-11-29 MED ORDER — OXYCODONE HCL 5 MG PO TABS
5.0000 mg | ORAL_TABLET | ORAL | Status: DC | PRN
  Administered 2015-11-29 – 2015-12-02 (×11): 5 mg via ORAL
  Filled 2015-11-29 (×11): qty 1

## 2015-11-29 NOTE — Progress Notes (Addendum)
[address]       [place] [address]             [phone]         Subjective: C/O pain- "stinging ", not well controlled with PCA  Objective: Vital signs in last 24 hours: Temp:  [98 F (36.7 C)-98.2 F (36.8 C)] 98 F (36.7 C) (09/30 0500) Pulse Rate:  [85-100] 95 (09/30 0500) Cardiac Rhythm: Normal sinus rhythm (09/29 2134) Resp:  [20-36] 25 (09/30 0800) BP: (115-163)/(65-111) 124/75 (09/30 0500) SpO2:  [2 %-99 %] 2 % (09/30 0800) Weight:  [210 lb (95.3 kg)] 210 lb (95.3 kg) (09/29 1400)  Hemodynamic parameters for last 24 hours:    Intake/Output from previous day: 09/29 0701 - 09/30 0700 In: -  Out: 10 [Chest Tube:10] Intake/Output this shift: No intake/output data recorded.  General appearance: alert, cooperative and mild distress Heart: regular rate and rhythm Lungs: coarse on left Abdomen: soft Extremities: no edema Wound: dressing CDI, tube connections OK  Lab Results:  Recent Labs  11/28/15 1415 11/29/15 0521  WBC 6.5 7.9  HGB 14.6 13.4  HCT 44.3 42.2  PLT 139* 115*   BMET:  Recent Labs  11/28/15 1415 11/29/15 0521  NA 139 140  K 4.6 4.7  CL 110 111  CO2 21* 20*  GLUCOSE 103* 94  BUN 12 15  CREATININE 1.17* 1.08*  CALCIUM 10.2 9.7    PT/INR:  Recent Labs  11/28/15 2108  LABPROT 32.8*  INR 3.12   ABG No results found for: PHART, HCO3, TCO2, ACIDBASEDEF, O2SAT CBG (last 3)  No results for input(s): GLUCAP in the last 72 hours.  Meds Scheduled Meds: . aspirin EC  81 mg Oral Daily  . atorvastatin  40 mg Oral QHS  . carvedilol  18.75 mg Oral BID WC  . famotidine  20 mg Oral Daily  . fentaNYL   Intravenous Q4H  . methimazole  15 mg Oral QPM  . mometasone-formoterol  2 puff Inhalation BID  . senna-docusate  1 tablet Oral BID  . sodium chloride flush  3 mL Intravenous Q12H  . warfarin  5 mg Oral q1800  . Warfarin - Physician Dosing Inpatient   Does not apply q1800   Continuous Infusions:  PRN  Meds:.albuterol, diphenhydrAMINE **OR** diphenhydrAMINE, naloxone **AND** sodium chloride flush, ondansetron (ZOFRAN) IV  Xrays Dg Chest 2 View  Addendum Date: 11/28/2015   ADDENDUM REPORT: 11/28/2015 14:09 ADDENDUM: These results were discussed by telephone on 11/28/2015 at 2:08 pm with Ms Laurance Flatten, NP, who verbally acknowledged the results. Signed, Dulcy Fanny. Earleen Newport, DO [facility] Radiology Electronically Signed   By: Corrie Mckusick D.O.   On: 11/28/2015 14:09   Result Date: 11/28/2015 CLINICAL DATA:  70 year old female with shortness of breath and wheezing. EXAM: CHEST  2 VIEW COMPARISON:  None. FINDINGS: Large left pneumothorax with mild deviation of the trachea from left to right. Using Collins method  % = 4.2 + 4.7 * (A+B+C) where A =5.7cm, B =7.4cm, C = 6.5cm  percentage is estimated 96%. Interstitial opacities of the right lung with surgical staple line of the lateral right lung. Surgical changes of prior median sternotomy and aortic valve repair. No pleural effusion. No displaced fracture.  Degenerative changes of the spine. IMPRESSION: Large left pneumothorax with evidence of early tension. Surgical changes of median sternotomy and aortic valve replacement. The patient is currently being sent to the emergency department from outpatient  imaging center. Attempts to contact the referring physician by phone, with message left with the answering service during closed office hours. Awaiting return phone call, at which time addendum can be made. Signed, Dulcy Fanny. Earleen Newport, DO [facility] Radiology Electronically Signed: By: Corrie Mckusick D.O. On: 11/28/2015 13:37   Dg Chest Port 1 View  Result Date: 11/29/2015 CLINICAL DATA:  Evaluate chest tube placement. EXAM: PORTABLE CHEST 1 VIEW COMPARISON:  November 28, 2015 FINDINGS: Left chest tube remains in the left apex. However, the left-sided pneumothorax is much  larger in the interval measuring up to 3 cm in thickness laterally and 4.8 cm near the base. Stable cardiomediastinal silhouette. No evidence of tension caused but the pneumothorax. Postsurgical changes in the right apex. No right-sided pneumothorax. The right lung is unchanged. Mild pleural thickening near the right apex is stable, likely due to scarring. No other interval changes. IMPRESSION: The left-sided chest tube remains in place but the left-sided pneumothorax is much larger in the interval and there is now air in the subcutaneous tissues of the lateral chest wall on the left. Findings called to the patient's Nurse, Ms. Keenan Bachelor. Electronically Signed   By: Dorise Bullion III M.D   On: 11/29/2015 07:56   Dg Chest Port 1 View  Result Date: 11/28/2015 CLINICAL DATA:  Left chest tube in place.  Pneumothorax. EXAM: PORTABLE CHEST 1 VIEW COMPARISON:  Earlier today FINDINGS: There has been interval placement of a left-sided chest tube. There has been near complete resolution of previous large left-sided pneumothorax. Atelectasis is identified within the left base. Right lung is clear. IMPRESSION: 1. Interval placement of left-sided chest tube with near complete resolution of large left pneumothorax. Electronically Signed   By: Kerby Moors M.D.   On: 11/28/2015 18:06    Assessment/Plan: S/P left chest tube 1.  + pain- will give 1 dose of toradol 2 large air leak , increase in pntx-  tube appears to be in good position unless in a fissure, keep to suction, add vaseline gauze     LOS: 1 day    GOLD,WAYNE E 11/29/2015  Patient seen and examined C/o pain from chest tube Lung was partially collapsed on CXR this AM, likely was kinked at time of CXR- repeat film shows lung reexpanded. Still has an air leak- keep CT to suction  Remo Lipps C. Roxan Hockey, MD [facility] [phone]

## 2015-11-30 ENCOUNTER — Inpatient Hospital Stay (HOSPITAL_COMMUNITY): Payer: Medicare Other

## 2015-11-30 LAB — PROTIME-INR
INR: 4.46 — AB
Prothrombin Time: 43.6 seconds — ABNORMAL HIGH (ref 11.4–15.2)

## 2015-11-30 NOTE — Progress Notes (Addendum)
[address]       [place] [address]             [phone]          Subjective: Feeling better, pain better controlled  Objective  Telemetry sinus , pvc's  Temp:  [97.8 F (36.6 C)-98.1 F (36.7 C)] 97.9 F (36.6 C) (10/01 0627) Pulse Rate:  [82-92] 82 (10/01 0934) Resp:  [14-33] 18 (10/01 0934) BP: (111-121)/(67-77) 112/77 (10/01 0627) SpO2:  [95 %-100 %] 95 % (10/01 0934)   Intake/Output Summary (Last 24 hours) at 11/30/15 0952 Last data filed at 11/30/15 0625  Gross per 24 hour  Intake                0 ml  Output               54 ml  Net              -54 ml       General appearance: alert, cooperative and no distress Heart: regular rate and rhythm Lungs: coarse BS Abdomen: benign  Lab Results:  Recent Labs  11/28/15 1415 11/29/15 0521  NA 139 140  K 4.6 4.7  CL 110 111  CO2 21* 20*  GLUCOSE 103* 94  BUN 12 15  CREATININE 1.17* 1.08*  CALCIUM 10.2 9.7    Recent Labs  11/28/15 1415  AST 22  ALT 15  ALKPHOS 112  BILITOT 0.8  PROT 7.2  ALBUMIN 4.1   No results for input(s): LIPASE, AMYLASE in the last 72 hours.  Recent Labs  11/28/15 1415 11/29/15 0521  WBC 6.5 7.9  NEUTROABS 3.7  --   HGB 14.6 13.4  HCT 44.3 42.2  MCV 96.3 97.9  PLT 139* 115*   No results for input(s): CKTOTAL, CKMB, TROPONINI in the last 72 hours. Invalid input(s): POCBNP No results for input(s): DDIMER in the last 72 hours. No results for input(s): HGBA1C in the last 72 hours. No results for input(s): CHOL, HDL, LDLCALC, TRIG, CHOLHDL in the last 72 hours. No results for input(s): TSH, T4TOTAL, T3FREE, THYROIDAB in the last 72 hours.  Invalid input(s): FREET3 No results for input(s): VITAMINB12, FOLATE, FERRITIN, TIBC, IRON, RETICCTPCT in the last 72 hours.  Medications: Scheduled . acetaminophen  650 mg Oral Q6H  . aspirin EC  81 mg Oral Daily  . atorvastatin  40 mg Oral QHS  . carvedilol  18.75 mg Oral BID WC  . famotidine   20 mg Oral Daily  . fentaNYL   Intravenous Q4H  . methimazole  15 mg Oral QPM  . mometasone-formoterol  2 puff Inhalation BID  . senna-docusate  1 tablet Oral BID  . sodium chloride flush  3 mL Intravenous Q12H  . Warfarin - Physician Dosing Inpatient   Does not apply q1800     Radiology/Studies:  Dg Chest 2 View  Addendum Date: 11/28/2015   ADDENDUM REPORT: 11/28/2015 14:09 ADDENDUM: These results were discussed by telephone on 11/28/2015 at 2:08 pm with Ms Laurance Flatten, NP, who verbally acknowledged the results. Signed, Dulcy Fanny. Earleen Newport, DO [facility] Electronically Signed   By: Corrie Mckusick D.O.   On: 11/28/2015 14:09   Result Date: 11/28/2015 CLINICAL DATA:  70 year old female with shortness of breath and wheezing. EXAM: CHEST  2 VIEW COMPARISON:  None. FINDINGS: Large left pneumothorax with mild deviation of the trachea from left to right. Using Collins method  % =  4.2 + 4.7 * (A+B+C) where A =5.7cm, B =7.4cm, C = 6.5cm  percentage is estimated 96%. Interstitial opacities of the right lung with surgical staple line of the lateral right lung. Surgical changes of prior median sternotomy and aortic valve repair. No pleural effusion. No displaced fracture.  Degenerative changes of the spine. IMPRESSION: Large left pneumothorax with evidence of early tension. Surgical changes of median sternotomy and aortic valve replacement. The patient is currently being sent to the emergency department from outpatient imaging center. Attempts to contact the referring physician by phone, with message left with the answering service during closed office hours. Awaiting return phone call, at which time addendum can be made. Signed, Dulcy Fanny. Earleen Newport, DO [facility] Electronically Signed: By: Corrie Mckusick D.O. On: 11/28/2015 13:37   Dg Chest Port 1 View  Result Date: 11/30/2015 CLINICAL DATA:  Followup for  pneumothorax. EXAM: PORTABLE CHEST 1 VIEW COMPARISON:  11/29/2015 FINDINGS: There is no convincing residual left pneumothorax. Opacity at the left lung base is stable consistent with atelectasis. Left-sided subcutaneous emphysema has mildly decreased from the previous day's study. There is a stable anastomosis staple line along the right mid to upper lung. Left chest tube is stable. IMPRESSION: 1. No convincing residual left pneumothorax. 2. Persistent left lung base opacity consistent with atelectasis. No convincing pneumonia or pulmonary edema. 3. Stable left chest tube. Electronically Signed   By: Lajean Manes M.D.   On: 11/30/2015 07:58   Dg Chest Port 1 View  Result Date: 11/29/2015 CLINICAL DATA:  Follow-up left pneumothorax. EXAM: PORTABLE CHEST 1 VIEW COMPARISON:  Chest x-ray from earlier same day and chest x-rays dated 11/28/2015. FINDINGS: Left-sided chest tube is stable in position with tip directed towards the lung apex. There is near complete resolution of the pneumothorax. Streaky opacities at the left lung base are likely associated atelectasis and/or small effusion. Right lung is clear. Heart size is normal. Median sternotomy wires appear intact and stable in alignment. IMPRESSION: 1. Near complete resolution of the left pneumothorax. Only a small pneumothorax remains. Left-sided chest tube remains well positioned with tip directed towards the lung apex. 2. Associated mild atelectasis and/or small effusion at the left lung base. 3. Right lung is clear. Electronically Signed   By: Franki Cabot M.D.   On: 11/29/2015 11:59   Dg Chest Port 1 View  Result Date: 11/29/2015 CLINICAL DATA:  Evaluate chest tube placement. EXAM: PORTABLE CHEST 1 VIEW COMPARISON:  November 28, 2015 FINDINGS: Left chest tube remains in the left apex. However, the left-sided pneumothorax is much larger in the interval measuring up to 3 cm in thickness laterally and 4.8 cm near the base. Stable cardiomediastinal  silhouette. No evidence of tension caused but the pneumothorax. Postsurgical changes in the right apex. No right-sided pneumothorax. The right lung is unchanged. Mild pleural thickening near the right apex is stable, likely due to scarring. No other interval changes. IMPRESSION: The left-sided chest tube remains in place but the left-sided pneumothorax is much larger in the interval and there is now air in the subcutaneous tissues of the lateral chest wall on the left. Findings called to the patient's Nurse, Ms. Keenan Bachelor. Electronically Signed   By: Dorise Bullion III M.D   On: 11/29/2015 07:56   Dg Chest Port 1 View  Result Date: 11/28/2015 CLINICAL DATA:  Left chest tube in place.  Pneumothorax. EXAM: PORTABLE CHEST 1 VIEW COMPARISON:  Earlier today FINDINGS: There has been interval placement  of a left-sided chest tube. There has been near complete resolution of previous large left-sided pneumothorax. Atelectasis is identified within the left base. Right lung is clear. IMPRESSION: 1. Interval placement of left-sided chest tube with near complete resolution of large left pneumothorax. Electronically Signed   By: Kerby Moors M.D.   On: 11/28/2015 18:06    INR: Will add last result for INR, ABG once components are confirmed Will add last 4 CBG results once components are confirmed  Assessment/Plan:  1 lung re-expanded on CXR 2 + air leak persists- keep to suction  3 INR too high- stop coumadin for now 4 hopefully can avoid VATS but that is not certain currently   LOS: 2 days    GOLD,WAYNE E 10/1/20179:52 AM  Patient seen and examined. Air leak might be slightly smaller than yesterday but will keep on suction for now Hold coumadin for now as INR too high and may need surgery  Remo Lipps C. Roxan Hockey, MD [facility] [phone]

## 2015-11-30 NOTE — Progress Notes (Signed)
CRITICAL VALUE ALERT  Critical value received:  INR 4.46  Date of notification:  11/30/2015  Time of notification:  0407  Critical value read back:Yes.    Nurse who received alert:  Marlowe Shores RN  MD notified (1st page):  Roxan Hockey  Time of first page:  0610  MD notified (2nd page):  Time of second page:  Responding MD:  Roxan Hockey  Time MD responded:  [phone]

## 2015-12-01 ENCOUNTER — Inpatient Hospital Stay (HOSPITAL_COMMUNITY): Payer: Medicare Other

## 2015-12-01 LAB — PROTIME-INR
INR: 4.56
PROTHROMBIN TIME: 44.5 s — AB (ref 11.4–15.2)

## 2015-12-01 MED ORDER — PHYTONADIONE 5 MG PO TABS
5.0000 mg | ORAL_TABLET | Freq: Once | ORAL | Status: AC
  Administered 2015-12-01: 5 mg via ORAL
  Filled 2015-12-01: qty 1

## 2015-12-01 NOTE — Care Management Important Message (Signed)
Important Message  Patient Details  Name: Danielle [name] MRN: [id number] Date of Birth: 1945/12/30   Medicare Important Message Given:  Yes    Nathen May 12/01/2015, 12:42 PM

## 2015-12-01 NOTE — Progress Notes (Addendum)
      [address]       [place] [address]             [phone]      Subjective:  Ms. Kennis Carina complaining of burning at chest tube site.    Objective: Vital signs in last 24 hours: Temp:  [98.2 F (36.8 C)-98.5 F (36.9 C)] 98.5 F (36.9 C) (10/02 0626) Pulse Rate:  [78-86] 81 (10/02 0626) Cardiac Rhythm: Normal sinus rhythm (10/01 1900) Resp:  [18-28] 20 (10/02 0626) BP: (127-132)/(84-96) 127/84 (10/02 0626) SpO2:  [94 %-99 %] 97 % (10/02 0626)  Intake/Output from previous day: 10/01 0701 - 10/02 0700 In: -  Out: 921 [Urine:800; Chest Tube:121]  General appearance: alert, cooperative and no distress Lungs: clear to auscultation bilaterally Abdomen: soft, non-tender; bowel sounds normal; no masses,  no organomegaly Wound: clean and dry  Lab Results:  Recent Labs  11/28/15 1415 11/29/15 0521  WBC 6.5 7.9  HGB 14.6 13.4  HCT 44.3 42.2  PLT 139* 115*   BMET:  Recent Labs  11/28/15 1415 11/29/15 0521  NA 139 140  K 4.6 4.7  CL 110 111  CO2 21* 20*  GLUCOSE 103* 94  BUN 12 15  CREATININE 1.17* 1.08*  CALCIUM 10.2 9.7    PT/INR:  Recent Labs  12/01/15 0327  LABPROT 44.5*  INR 4.56*   ABG No results found for: PHART, HCO3, TCO2, ACIDBASEDEF, O2SAT CBG (last 3)  No results for input(s): GLUCAP in the last 72 hours.  Assessment/Plan:  1. Chest tube- +1 air leak with cough, minimal output....leave on suction today 2. INR 4.56, continue to hold coumadin 3. Pulm- CXR remains stable, wean oxygen as tolerated 4. Dispo- patient stable, 1+ air leak on cough, leave on suction today, continue to hold coumadin in case surgery is indicated   LOS: 3 days    Ahmed Prima, [name] 12/01/2015  Patient seen and examined, agree with above Still has an air leak- likely will need VATS] Will get CT to check for blebs] INR still elevated- She has a bioprosthetic valve and coumadin is for a fib- will give Vit K PO  Remo Lipps C. Roxan Hockey, MD [facility] [phone]

## 2015-12-01 NOTE — Progress Notes (Signed)
Received critical lab value of INR 4.56. Will leave for MD to review this am because lab value is consistent will previous critical lab value from yesterday. Will continue to monitor.

## 2015-12-02 ENCOUNTER — Inpatient Hospital Stay (HOSPITAL_COMMUNITY): Payer: Medicare Other

## 2015-12-02 DIAGNOSIS — J9311 Primary spontaneous pneumothorax: Secondary | ICD-10-CM

## 2015-12-02 LAB — CBC
HCT: 40.6 % (ref 36.0–46.0)
Hemoglobin: 12.9 g/dL (ref 12.0–15.0)
MCH: 30.9 pg (ref 26.0–34.0)
MCHC: 31.8 g/dL (ref 30.0–36.0)
MCV: 97.1 fL (ref 78.0–100.0)
PLATELETS: 112 10*3/uL — AB (ref 150–400)
RBC: 4.18 MIL/uL (ref 3.87–5.11)
RDW: 13 % (ref 11.5–15.5)
WBC: 6.8 10*3/uL (ref 4.0–10.5)

## 2015-12-02 LAB — URINALYSIS, ROUTINE W REFLEX MICROSCOPIC
BILIRUBIN URINE: NEGATIVE
Glucose, UA: NEGATIVE mg/dL
Hgb urine dipstick: NEGATIVE
Ketones, ur: NEGATIVE mg/dL
Leukocytes, UA: NEGATIVE
NITRITE: NEGATIVE
PROTEIN: NEGATIVE mg/dL
SPECIFIC GRAVITY, URINE: 1.038 — AB (ref 1.005–1.030)
pH: 6 (ref 5.0–8.0)

## 2015-12-02 LAB — COMPREHENSIVE METABOLIC PANEL
ALT: 13 U/L — ABNORMAL LOW (ref 14–54)
AST: 22 U/L (ref 15–41)
Albumin: 3.1 g/dL — ABNORMAL LOW (ref 3.5–5.0)
Alkaline Phosphatase: 86 U/L (ref 38–126)
Anion gap: 3 — ABNORMAL LOW (ref 5–15)
BUN: 23 mg/dL — ABNORMAL HIGH (ref 6–20)
CHLORIDE: 108 mmol/L (ref 101–111)
CO2: 28 mmol/L (ref 22–32)
CREATININE: 1.01 mg/dL — AB (ref 0.44–1.00)
Calcium: 9.7 mg/dL (ref 8.9–10.3)
GFR, EST NON AFRICAN AMERICAN: 55 mL/min — AB (ref >60)
Glucose, Bld: 94 mg/dL (ref 65–99)
POTASSIUM: 4.7 mmol/L (ref 3.5–5.1)
Sodium: 139 mmol/L (ref 135–145)
TOTAL PROTEIN: 5.7 g/dL — AB (ref 6.5–8.1)
Total Bilirubin: 1 mg/dL (ref 0.3–1.2)

## 2015-12-02 LAB — PROTIME-INR
INR: 2.15
Prothrombin Time: 24.4 seconds — ABNORMAL HIGH (ref 11.4–15.2)

## 2015-12-02 LAB — TYPE AND SCREEN
ABO/RH(D): O POS
ANTIBODY SCREEN: NEGATIVE

## 2015-12-02 LAB — SURGICAL PCR SCREEN
MRSA, PCR: NEGATIVE
Staphylococcus aureus: NEGATIVE

## 2015-12-02 MED ORDER — PHYTONADIONE 5 MG PO TABS
2.5000 mg | ORAL_TABLET | Freq: Once | ORAL | Status: AC
  Administered 2015-12-02: 2.5 mg via ORAL
  Filled 2015-12-02: qty 1

## 2015-12-02 MED ORDER — BISACODYL 10 MG RE SUPP
10.0000 mg | Freq: Every day | RECTAL | Status: DC | PRN
  Administered 2015-12-02: 10 mg via RECTAL
  Filled 2015-12-02: qty 1

## 2015-12-02 MED ORDER — DEXTROSE 5 % IV SOLN
1.5000 g | INTRAVENOUS | Status: AC
  Administered 2015-12-03: 1.5 g via INTRAVENOUS
  Filled 2015-12-02 (×2): qty 1.5

## 2015-12-02 NOTE — Progress Notes (Addendum)
      [address]       [place] [address]             [phone]      Subjective:  [name] has no new complaints.  Upset about current condition and possible surgery.   Objective: Vital signs in last 24 hours: Temp:  [98.1 F (36.7 C)-98.4 F (36.9 C)] 98.4 F (36.9 C) (10/03 0400) Pulse Rate:  [79-90] 79 (10/03 0400) Cardiac Rhythm: Normal sinus rhythm (10/02 1900) Resp:  [19-23] 23 (10/03 0400) BP: (116-118)/(75-89) 116/89 (10/03 0400) SpO2:  [95 %-100 %] 100 % (10/03 0748)  Intake/Output from previous day: 10/02 0701 - 10/03 0700 In: 730 [P.O.:730] Out: 775 [Urine:775]  General appearance: alert, cooperative and no distress Heart: regular rate and rhythm Lungs: clear to auscultation bilaterally, whistling sound during auscultation  Abdomen: soft, non-tender; bowel sounds normal; no masses,  no organomegaly Wound: clean and dry, sub q air around chest tube  Lab Results: No results for input(s): WBC, HGB, HCT, PLT in the last 72 hours. BMET: No results for input(s): NA, K, CL, CO2, GLUCOSE, BUN, CREATININE, CALCIUM in the last 72 hours.  PT/INR:  Recent Labs  12/02/15 0239  LABPROT 24.4*  INR 2.15   ABG No results found for: PHART, HCO3, TCO2, ACIDBASEDEF, O2SAT CBG (last 3)  No results for input(s): GLUCAP in the last 72 hours.  Assessment/Plan:  1. Chest tube- worsening air leak with cough, development of sub q emphysema on CXR, CT scan with severe bullous emphysema 2. INR 2.15 after Vit K 3. Dispo- patient will need VATS procedure, worsening air leak on Pleurovac, development of sub q air, continue current care, surgery timing per Dr. Roxan Hockey   LOS: 4 days    Danielle [name] 12/02/2015  Her air leak has worsened over past 24 hours. Ct of the chest shows numerous large blebs in the left lung. It is highly unlikely her air leak will resolve with CT drainage alone, and even if it did, her risk for recurrence would be extremely  high. The best option is to proceed with Left VATS, blebectomy and pleural abrasion. That does not eliminate the possibility of recurrence but decrease it significantly.   I have discussed the general nature of the procedure, the need for general anesthesia, and the incisions to be used with Mrs. Danielle [name]. We discussed the expected hospital stay, overall recovery and short and long term outcomes. I reviewed the indications, risks, benefits and alternatives with her. She understands the risks include, but are not limited to death, stroke, MI, DVT/PE, bleeding, possible need for transfusion, infections, prolonged air leak, cardiac arrhythmias, as well as the possibility of other unforeseeable complications.   She understands and accepts the risks and agrees to proceed.  Plan Left VATS, blebectomy and pleural abrasion tomorrow morning  Remo Lipps C. Roxan Hockey, MD [facility] [phone]

## 2015-12-03 ENCOUNTER — Inpatient Hospital Stay (HOSPITAL_COMMUNITY): Payer: Medicare Other

## 2015-12-03 ENCOUNTER — Inpatient Hospital Stay (HOSPITAL_COMMUNITY): Payer: Medicare Other | Admitting: Certified Registered Nurse Anesthetist

## 2015-12-03 ENCOUNTER — Encounter (HOSPITAL_COMMUNITY)
Admission: EM | Disposition: A | Discharge status: Home / Self Care | Payer: Self-pay | Source: Home / Self Care | Attending: Thoracic Surgery (Cardiothoracic Vascular Surgery)

## 2015-12-03 DIAGNOSIS — J439 Emphysema, unspecified: Secondary | ICD-10-CM | POA: Diagnosis present

## 2015-12-03 HISTORY — PX: PLEURADESIS: SHX6030

## 2015-12-03 HISTORY — PX: THORACOSCOPY: SUR1347

## 2015-12-03 HISTORY — PX: VIDEO ASSISTED THORACOSCOPY: SHX5073

## 2015-12-03 HISTORY — PX: RESECTION OF APICAL BLEB: SHX5078

## 2015-12-03 LAB — BLOOD GAS, ARTERIAL
Acid-Base Excess: 0.1 mmol/L (ref 0.0–2.0)
Bicarbonate: 23.8 mmol/L (ref 20.0–28.0)
Drawn by: 460981
O2 CONTENT: 3 L/min
O2 SAT: 95.9 %
PATIENT TEMPERATURE: 98.6
pCO2 arterial: 35.8 mmHg (ref 32.0–48.0)
pH, Arterial: 7.438 (ref 7.350–7.450)
pO2, Arterial: 84 mmHg (ref 83.0–108.0)

## 2015-12-03 LAB — PROTIME-INR
INR: 1.23
Prothrombin Time: 15.5 seconds — ABNORMAL HIGH (ref 11.4–15.2)

## 2015-12-03 LAB — GLUCOSE, CAPILLARY: Glucose-Capillary: 98 mg/dL (ref 65–99)

## 2015-12-03 SURGERY — VIDEO ASSISTED THORACOSCOPY
Anesthesia: General | Site: Chest | Laterality: Left

## 2015-12-03 MED ORDER — LIDOCAINE 2% (20 MG/ML) 5 ML SYRINGE
INTRAMUSCULAR | Status: DC | PRN
  Administered 2015-12-03: 60 mg via INTRAVENOUS

## 2015-12-03 MED ORDER — ONDANSETRON HCL 4 MG/2ML IJ SOLN
INTRAMUSCULAR | Status: DC | PRN
  Administered 2015-12-03: 4 mg via INTRAVENOUS

## 2015-12-03 MED ORDER — DIPHENHYDRAMINE HCL 50 MG/ML IJ SOLN: 12.5000 mg | Freq: Four times a day (QID) | INTRAMUSCULAR | Status: DC | PRN

## 2015-12-03 MED ORDER — LACTATED RINGERS IV SOLN
INTRAVENOUS | Status: DC
  Administered 2015-12-03: 10:00:00 via INTRAVENOUS

## 2015-12-03 MED ORDER — ONDANSETRON HCL 4 MG/2ML IJ SOLN
4.0000 mg | Freq: Four times a day (QID) | INTRAMUSCULAR | Status: DC | PRN
  Administered 2015-12-04: 4 mg via INTRAVENOUS
  Filled 2015-12-03: qty 2

## 2015-12-03 MED ORDER — OXYCODONE HCL 5 MG PO TABS: 5.0000 mg | ORAL_TABLET | Freq: Once | ORAL | Status: DC | PRN

## 2015-12-03 MED ORDER — NALOXONE HCL 0.4 MG/ML IJ SOLN: 0.4000 mg | INTRAMUSCULAR | Status: DC | PRN

## 2015-12-03 MED ORDER — TRAMADOL HCL 50 MG PO TABS: 50.0000 mg | ORAL_TABLET | Freq: Four times a day (QID) | ORAL | Status: DC | PRN

## 2015-12-03 MED ORDER — ACETAMINOPHEN 160 MG/5ML PO SOLN: 1000.0000 mg | Freq: Four times a day (QID) | ORAL | Status: AC

## 2015-12-03 MED ORDER — LACTATED RINGERS IV SOLN
INTRAVENOUS | Status: DC | PRN
  Administered 2015-12-03: 09:00:00 via INTRAVENOUS

## 2015-12-03 MED ORDER — ACETAMINOPHEN 160 MG/5ML PO SOLN
325.0000 mg | ORAL | Status: DC | PRN
  Filled 2015-12-03: qty 20.3

## 2015-12-03 MED ORDER — DEXTROSE 5 % IV SOLN
1.5000 g | Freq: Two times a day (BID) | INTRAVENOUS | Status: AC
  Administered 2015-12-03 – 2015-12-04 (×2): 1.5 g via INTRAVENOUS
  Filled 2015-12-03 (×2): qty 1.5

## 2015-12-03 MED ORDER — SENNOSIDES-DOCUSATE SODIUM 8.6-50 MG PO TABS
1.0000 | ORAL_TABLET | Freq: Every day | ORAL | Status: DC
  Administered 2015-12-03: 1 via ORAL
  Filled 2015-12-03: qty 1

## 2015-12-03 MED ORDER — POTASSIUM CHLORIDE 10 MEQ/50ML IV SOLN: 10.0000 meq | Freq: Every day | INTRAVENOUS | Status: DC | PRN

## 2015-12-03 MED ORDER — SUFENTANIL CITRATE 50 MCG/ML IV SOLN
INTRAVENOUS | Status: DC | PRN
  Administered 2015-12-03: 10 ug via INTRAVENOUS
  Administered 2015-12-03: 5 ug via INTRAVENOUS
  Administered 2015-12-03: 10 ug via INTRAVENOUS
  Administered 2015-12-03: 5 ug via INTRAVENOUS

## 2015-12-03 MED ORDER — HYDROMORPHONE HCL 1 MG/ML IJ SOLN
INTRAMUSCULAR | Status: AC
  Filled 2015-12-03: qty 1

## 2015-12-03 MED ORDER — PHENYLEPHRINE HCL 10 MG/ML IJ SOLN
INTRAVENOUS | Status: DC | PRN
  Administered 2015-12-03: 50 ug/min via INTRAVENOUS

## 2015-12-03 MED ORDER — SUGAMMADEX SODIUM 200 MG/2ML IV SOLN
INTRAVENOUS | Status: DC | PRN
  Administered 2015-12-03: 200 mg via INTRAVENOUS

## 2015-12-03 MED ORDER — MIDAZOLAM HCL 2 MG/2ML IJ SOLN
INTRAMUSCULAR | Status: DC | PRN
  Administered 2015-12-03: 1 mg via INTRAVENOUS

## 2015-12-03 MED ORDER — LEVALBUTEROL HCL 0.63 MG/3ML IN NEBU
0.6300 mg | INHALATION_SOLUTION | Freq: Four times a day (QID) | RESPIRATORY_TRACT | Status: DC
  Administered 2015-12-03: 0.63 mg via RESPIRATORY_TRACT
  Filled 2015-12-03: qty 3

## 2015-12-03 MED ORDER — LACTATED RINGERS IV SOLN
INTRAVENOUS | Status: DC | PRN
  Administered 2015-12-03: 11:00:00 via INTRAVENOUS

## 2015-12-03 MED ORDER — OXYCODONE HCL 5 MG/5ML PO SOLN: 5.0000 mg | Freq: Once | ORAL | Status: DC | PRN

## 2015-12-03 MED ORDER — BISACODYL 5 MG PO TBEC
10.0000 mg | DELAYED_RELEASE_TABLET | Freq: Every day | ORAL | Status: DC
Start: 2015-12-03 — End: 2015-12-09
  Administered 2015-12-04 – 2015-12-09 (×5): 10 mg via ORAL
  Filled 2015-12-03 (×5): qty 2

## 2015-12-03 MED ORDER — OXYCODONE HCL 5 MG PO TABS
ORAL_TABLET | ORAL | Status: AC
  Filled 2015-12-03: qty 1

## 2015-12-03 MED ORDER — LEVALBUTEROL HCL 0.63 MG/3ML IN NEBU
0.6300 mg | INHALATION_SOLUTION | Freq: Three times a day (TID) | RESPIRATORY_TRACT | Status: DC
  Administered 2015-12-04: 0.63 mg via RESPIRATORY_TRACT
  Filled 2015-12-03: qty 3

## 2015-12-03 MED ORDER — HYDROMORPHONE HCL 1 MG/ML IJ SOLN
INTRAMUSCULAR | Status: AC
  Administered 2015-12-03: 0.5 mg via INTRAVENOUS
  Filled 2015-12-03: qty 1

## 2015-12-03 MED ORDER — SUFENTANIL CITRATE 50 MCG/ML IV SOLN
INTRAVENOUS | Status: AC
  Filled 2015-12-03: qty 1

## 2015-12-03 MED ORDER — MIDAZOLAM HCL 2 MG/2ML IJ SOLN
INTRAMUSCULAR | Status: AC
  Filled 2015-12-03: qty 2

## 2015-12-03 MED ORDER — ASPIRIN EC 81 MG PO TBEC
81.0000 mg | DELAYED_RELEASE_TABLET | Freq: Every day | ORAL | Status: DC
  Administered 2015-12-04 – 2015-12-09 (×6): 81 mg via ORAL
  Filled 2015-12-03 (×6): qty 1

## 2015-12-03 MED ORDER — HYDROMORPHONE HCL 1 MG/ML IJ SOLN
0.2500 mg | INTRAMUSCULAR | Status: DC | PRN
  Administered 2015-12-03 (×4): 0.5 mg via INTRAVENOUS

## 2015-12-03 MED ORDER — ACETAMINOPHEN 325 MG PO TABS: 325.0000 mg | ORAL_TABLET | ORAL | Status: DC | PRN

## 2015-12-03 MED ORDER — 0.9 % SODIUM CHLORIDE (POUR BTL) OPTIME
TOPICAL | Status: DC | PRN
  Administered 2015-12-03: 2000 mL

## 2015-12-03 MED ORDER — ENOXAPARIN SODIUM 40 MG/0.4ML ~~LOC~~ SOLN
40.0000 mg | SUBCUTANEOUS | Status: DC
  Administered 2015-12-03 – 2015-12-08 (×6): 40 mg via SUBCUTANEOUS
  Filled 2015-12-03 (×6): qty 0.4

## 2015-12-03 MED ORDER — SODIUM CHLORIDE 0.9 % IV SOLN
INTRAVENOUS | Status: DC
  Administered 2015-12-03 – 2015-12-04 (×3): via INTRAVENOUS

## 2015-12-03 MED ORDER — SODIUM CHLORIDE 0.9% FLUSH: 9.0000 mL | INTRAVENOUS | Status: DC | PRN

## 2015-12-03 MED ORDER — MOMETASONE FURO-FORMOTEROL FUM 200-5 MCG/ACT IN AERO
2.0000 | INHALATION_SPRAY | Freq: Two times a day (BID) | RESPIRATORY_TRACT | Status: DC
  Administered 2015-12-04 – 2015-12-09 (×10): 2 via RESPIRATORY_TRACT
  Filled 2015-12-03 (×2): qty 8.8

## 2015-12-03 MED ORDER — FENTANYL 40 MCG/ML IV SOLN
INTRAVENOUS | Status: AC
  Filled 2015-12-03: qty 25

## 2015-12-03 MED ORDER — ROCURONIUM BROMIDE 100 MG/10ML IV SOLN
INTRAVENOUS | Status: DC | PRN
  Administered 2015-12-03 (×2): 50 mg via INTRAVENOUS

## 2015-12-03 MED ORDER — OXYCODONE HCL 5 MG PO TABS
5.0000 mg | ORAL_TABLET | ORAL | Status: DC | PRN
  Administered 2015-12-03 – 2015-12-09 (×10): 10 mg via ORAL
  Filled 2015-12-03 (×10): qty 2

## 2015-12-03 MED ORDER — ATORVASTATIN CALCIUM 40 MG PO TABS
40.0000 mg | ORAL_TABLET | Freq: Every day | ORAL | Status: DC
  Administered 2015-12-04 – 2015-12-09 (×6): 40 mg via ORAL
  Filled 2015-12-03 (×6): qty 1

## 2015-12-03 MED ORDER — FENTANYL 40 MCG/ML IV SOLN
INTRAVENOUS | Status: DC
Start: 2015-12-03 — End: 2015-12-07
  Administered 2015-12-03: 165 ug via INTRAVENOUS
  Administered 2015-12-04: 195 ug via INTRAVENOUS
  Administered 2015-12-04: 180 ug via INTRAVENOUS
  Administered 2015-12-04: 45 ug via INTRAVENOUS
  Administered 2015-12-04: 270 ug via INTRAVENOUS
  Administered 2015-12-04: 150 ug via INTRAVENOUS
  Administered 2015-12-04: 45 ug via INTRAVENOUS
  Administered 2015-12-05: 90 ug via INTRAVENOUS
  Administered 2015-12-05: 120 ug via INTRAVENOUS
  Administered 2015-12-05: 15 ug via INTRAVENOUS
  Administered 2015-12-05: 195 ug via INTRAVENOUS
  Administered 2015-12-05: 135 ug via INTRAVENOUS
  Administered 2015-12-05: 0 ug via INTRAVENOUS
  Administered 2015-12-06: 120 ug via INTRAVENOUS
  Administered 2015-12-06: 105 ug via INTRAVENOUS
  Administered 2015-12-06: 30 ug via INTRAVENOUS
  Administered 2015-12-06: 120 ug via INTRAVENOUS
  Administered 2015-12-06: 60 ug via INTRAVENOUS
  Administered 2015-12-06 – 2015-12-07 (×2): 30 ug via INTRAVENOUS
  Administered 2015-12-07: 15 ug via INTRAVENOUS
  Administered 2015-12-07: 90 ug via INTRAVENOUS
  Filled 2015-12-03 (×2): qty 25

## 2015-12-03 MED ORDER — SODIUM CHLORIDE 0.9 % IJ SOLN
INTRAMUSCULAR | Status: AC
  Filled 2015-12-03: qty 10

## 2015-12-03 MED ORDER — DIPHENHYDRAMINE HCL 12.5 MG/5ML PO ELIX
12.5000 mg | ORAL_SOLUTION | Freq: Four times a day (QID) | ORAL | Status: DC | PRN
  Administered 2015-12-05: 12.5 mg via ORAL
  Filled 2015-12-03: qty 5

## 2015-12-03 MED ORDER — ACETAMINOPHEN 500 MG PO TABS
1000.0000 mg | ORAL_TABLET | Freq: Four times a day (QID) | ORAL | Status: AC
  Administered 2015-12-03 – 2015-12-08 (×16): 1000 mg via ORAL
  Filled 2015-12-03 (×18): qty 2

## 2015-12-03 MED ORDER — PROPOFOL 10 MG/ML IV BOLUS
INTRAVENOUS | Status: DC | PRN
  Administered 2015-12-03: 150 mg via INTRAVENOUS

## 2015-12-03 MED ORDER — INSULIN ASPART 100 UNIT/ML ~~LOC~~ SOLN: 0.0000 [IU] | SUBCUTANEOUS | Status: DC

## 2015-12-03 SURGICAL SUPPLY — 79 items
APPLIER CLIP ROT 10 11.4 M/L (STAPLE)
CANISTER SUCTION 2500CC (MISCELLANEOUS) ×2 IMPLANT
CATH KIT ON Q 5IN SLV (PAIN MANAGEMENT) IMPLANT
CATH THORACIC 28FR (CATHETERS) ×4 IMPLANT
CATH THORACIC 28FR RT ANG (CATHETERS) IMPLANT
CATH THORACIC 36FR (CATHETERS) IMPLANT
CATH THORACIC 36FR RT ANG (CATHETERS) IMPLANT
CLIP APPLIE ROT 10 11.4 M/L (STAPLE) IMPLANT
CLIP TI MEDIUM 6 (CLIP) IMPLANT
CONN ST 1/4X3/8  BEN (MISCELLANEOUS) ×2
CONN ST 1/4X3/8 BEN (MISCELLANEOUS) ×2 IMPLANT
CONN Y 3/8X3/8X3/8  BEN (MISCELLANEOUS)
CONN Y 3/8X3/8X3/8 BEN (MISCELLANEOUS) IMPLANT
CONT SPEC 4OZ CLIKSEAL STRL BL (MISCELLANEOUS) ×8 IMPLANT
COVER SURGICAL LIGHT HANDLE (MISCELLANEOUS) IMPLANT
DERMABOND ADVANCED (GAUZE/BANDAGES/DRESSINGS) ×1
DERMABOND ADVANCED .7 DNX12 (GAUZE/BANDAGES/DRESSINGS) ×1 IMPLANT
DRAIN CHANNEL 28F RND 3/8 FF (WOUND CARE) IMPLANT
DRAIN CHANNEL 32F RND 10.7 FF (WOUND CARE) IMPLANT
DRAPE LAPAROSCOPIC ABDOMINAL (DRAPES) ×2 IMPLANT
DRAPE SLUSH/WARMER DISC (DRAPES) ×2 IMPLANT
DRAPE WARM FLUID 44X44 (DRAPE) IMPLANT
ELECT BLADE 6.5 EXT (BLADE) ×2 IMPLANT
ELECT REM PT RETURN 9FT ADLT (ELECTROSURGICAL) ×2
ELECTRODE REM PT RTRN 9FT ADLT (ELECTROSURGICAL) ×1 IMPLANT
GAUZE SPONGE 4X4 12PLY STRL (GAUZE/BANDAGES/DRESSINGS) ×2 IMPLANT
GLOVE SURG SIGNA 7.5 PF LTX (GLOVE) ×4 IMPLANT
GOWN STRL REUS W/ TWL LRG LVL3 (GOWN DISPOSABLE) ×3 IMPLANT
GOWN STRL REUS W/ TWL XL LVL3 (GOWN DISPOSABLE) ×1 IMPLANT
GOWN STRL REUS W/TWL LRG LVL3 (GOWN DISPOSABLE) ×3
GOWN STRL REUS W/TWL XL LVL3 (GOWN DISPOSABLE) ×1
HEMOSTAT SURGICEL 2X14 (HEMOSTASIS) IMPLANT
KIT BASIN OR (CUSTOM PROCEDURE TRAY) ×2 IMPLANT
KIT ROOM TURNOVER OR (KITS) ×2 IMPLANT
KIT SUCTION CATH 14FR (SUCTIONS) ×2 IMPLANT
NS IRRIG 1000ML POUR BTL (IV SOLUTION) ×4 IMPLANT
PACK CHEST (CUSTOM PROCEDURE TRAY) ×2 IMPLANT
PAD ARMBOARD 7.5X6 YLW CONV (MISCELLANEOUS) ×4 IMPLANT
POUCH ENDO CATCH II 15MM (MISCELLANEOUS) IMPLANT
POUCH SPECIMEN RETRIEVAL 10MM (ENDOMECHANICALS) IMPLANT
RELOAD STAPLER GOLD 60MM (STAPLE) ×14 IMPLANT
SEALANT PROGEL (MISCELLANEOUS) IMPLANT
SEALANT SURG COSEAL 4ML (VASCULAR PRODUCTS) IMPLANT
SEALANT SURG COSEAL 8ML (VASCULAR PRODUCTS) IMPLANT
SOLUTION ANTI FOG 6CC (MISCELLANEOUS) ×2 IMPLANT
SPECIMEN JAR MEDIUM (MISCELLANEOUS) IMPLANT
SPONGE GAUZE 4X4 12PLY STER LF (GAUZE/BANDAGES/DRESSINGS) ×2 IMPLANT
SPONGE INTESTINAL PEANUT (DISPOSABLE) ×2 IMPLANT
SPONGE TONSIL 1 RF SGL (DISPOSABLE) ×2 IMPLANT
STAPLE ECHEON FLEX 60 POW ENDO (STAPLE) ×2 IMPLANT
STAPLER RELOAD GOLD 60MM (STAPLE) ×28
SUT ETHILON 3 0 FSL (SUTURE) ×2 IMPLANT
SUT PROLENE 4 0 RB 1 (SUTURE)
SUT PROLENE 4-0 RB1 .5 CRCL 36 (SUTURE) IMPLANT
SUT SILK  1 MH (SUTURE) ×2
SUT SILK 1 MH (SUTURE) ×2 IMPLANT
SUT SILK 2 0SH CR/8 30 (SUTURE) IMPLANT
SUT SILK 3 0SH CR/8 30 (SUTURE) IMPLANT
SUT VIC AB 1 CTX 36 (SUTURE) ×1
SUT VIC AB 1 CTX36XBRD ANBCTR (SUTURE) ×1 IMPLANT
SUT VIC AB 2-0 CTX 36 (SUTURE) ×2 IMPLANT
SUT VIC AB 2-0 UR6 27 (SUTURE) IMPLANT
SUT VIC AB 3-0 MH 27 (SUTURE) IMPLANT
SUT VIC AB 3-0 X1 27 (SUTURE) ×2 IMPLANT
SUT VICRYL 2 TP 1 (SUTURE) ×2 IMPLANT
SWAB COLLECTION DEVICE MRSA (MISCELLANEOUS) IMPLANT
SYSTEM SAHARA CHEST DRAIN ATS (WOUND CARE) ×2 IMPLANT
TAPE CLOTH 4X10 WHT NS (GAUZE/BANDAGES/DRESSINGS) ×2 IMPLANT
TAPE CLOTH SURG 4X10 WHT LF (GAUZE/BANDAGES/DRESSINGS) ×2 IMPLANT
TIP APPLICATOR SPRAY EXTEND 16 (VASCULAR PRODUCTS) IMPLANT
TOWEL OR 17X24 6PK STRL BLUE (TOWEL DISPOSABLE) ×2 IMPLANT
TOWEL OR 17X26 10 PK STRL BLUE (TOWEL DISPOSABLE) ×4 IMPLANT
TRAP SPECIMEN MUCOUS 40CC (MISCELLANEOUS) IMPLANT
TRAY FOLEY CATH 16FRSI W/METER (SET/KITS/TRAYS/PACK) ×2 IMPLANT
TROCAR XCEL BLADELESS 5X75MML (TROCAR) ×2 IMPLANT
TROCAR XCEL NON-BLD 5MMX100MML (ENDOMECHANICALS) IMPLANT
TUBE ANAEROBIC SPECIMEN COL (MISCELLANEOUS) IMPLANT
TUNNELER SHEATH ON-Q 11GX8 DSP (PAIN MANAGEMENT) IMPLANT
WATER STERILE IRR 1000ML POUR (IV SOLUTION) ×4 IMPLANT

## 2015-12-03 NOTE — Brief Op Note (Signed)
11/28/2015 - 12/03/2015  12:29 PM  PATIENT:  Danielle [name]  70 y.o. female  PRE-OPERATIVE DIAGNOSIS:  LEFT AIR LEAK severe bullous emphysema  POST-OPERATIVE DIAGNOSIS:  LEFT AIR LEAK severe bullous emphysema  PROCEDURE:  Procedure(s): VIDEO ASSISTED THORACOSCOPY (Left) BLEBECTOMY (Left) PLEURADESIS (Left)  SURGEON:  Surgeon(s) and Role:    * Melrose Nakayama, MD - Primary  PHYSICIAN ASSISTANT:  Nicholes Rough, PA-C  ANESTHESIA:   general  EBL:  Total I/O In: 900 [I.V.:900] Out: 275 [Urine:200; Blood:75]  BLOOD ADMINISTERED:none  DRAINS: 2 left pleural chest tubes   LOCAL MEDICATIONS USED:  NONE  SPECIMEN:  Upper lobe blebs  DISPOSITION OF SPECIMEN:  PATHOLOGY  COUNTS:  YES  TOURNIQUET:  * No tourniquets in log *  DICTATION: .Other Dictation: Dictation Number pending  PLAN OF CARE: Admit to inpatient   PATIENT DISPOSITION:  PACU - hemodynamically stable.   Delay start of Pharmacological VTE agent (>24hrs) due to surgical blood loss or risk of bleeding: no

## 2015-12-03 NOTE — Anesthesia Preprocedure Evaluation (Signed)
Anesthesia Evaluation  Patient identified by MRN, date of birth, ID band Patient awake    Reviewed: Allergy & Precautions, NPO status , Patient's Chart, lab work & pertinent test results  History of Anesthesia Complications Negative for: history of anesthetic complications  Airway Mallampati: II  TM Distance: >3 FB Neck ROM: Full    Dental  (+) Upper Dentures   Pulmonary shortness of breath, COPD, former smoker,  ptx from ruptured bleb   breath sounds clear to auscultation       Cardiovascular hypertension, + CAD and +CHF   Rhythm:Regular  S/p avr   Neuro/Psych CVA negative psych ROS   GI/Hepatic Neg liver ROS, GERD  Medicated and Controlled,  Endo/Other    Renal/GU Renal InsufficiencyRenal disease     Musculoskeletal  (+) Arthritis ,   Abdominal   Peds  Hematology   Anesthesia Other Findings   Reproductive/Obstetrics                             Anesthesia Physical Anesthesia Plan  ASA: III  Anesthesia Plan: General   Post-op Pain Management:    Induction: Intravenous  Airway Management Planned: Double Lumen EBT  Additional Equipment: Arterial line and CVP  Intra-op Plan:   Post-operative Plan: Extubation in OR  Informed Consent: I have reviewed the patients History and Physical, chart, labs and discussed the procedure including the risks, benefits and alternatives for the proposed anesthesia with the patient or authorized representative who has indicated his/her understanding and acceptance.   Dental advisory given  Plan Discussed with: CRNA and Surgeon  Anesthesia Plan Comments:         Anesthesia Quick Evaluation

## 2015-12-03 NOTE — Anesthesia Procedure Notes (Signed)
Procedure Name: Intubation Date/Time: 12/03/2015 10:45 AM Performed by: Trixie Deis A Pre-anesthesia Checklist: Patient identified, Emergency Drugs available, Suction available, Patient being monitored and Timeout performed Patient Re-evaluated:Patient Re-evaluated prior to inductionOxygen Delivery Method: Circle system utilized Preoxygenation: Pre-oxygenation with 100% oxygen Intubation Type: IV induction Ventilation: Mask ventilation without difficulty and Oral airway inserted - appropriate to patient size Laryngoscope Size: Mac and 3 Grade View: Grade I Endobronchial tube: Left and Double lumen EBT and 37 Fr Number of attempts: 1 Airway Equipment and Method: Rigid stylet Placement Confirmation: ETT inserted through vocal cords under direct vision,  positive ETCO2 and breath sounds checked- equal and bilateral Secured at: 29 cm Tube secured with: Tape Dental Injury: Teeth and Oropharynx as per pre-operative assessment

## 2015-12-03 NOTE — Interval H&P Note (Signed)
History and Physical Interval Note: Persistent air leak. CT shows severe diffuse bleb disease. Needs VATS, blebectomy  12/03/2015 10:18 AM  Danielle [name]  has presented today for surgery, with the diagnosis of LEFT AIR LEAK severe bullous emphysema  The various methods of treatment have been discussed with the patient and family. After consideration of risks, benefits and other options for treatment, the patient has consented to  Procedure(s): VIDEO ASSISTED THORACOSCOPY (Left) BLEBECTOMY (Left) as a surgical intervention .  The patient's history has been reviewed, patient examined, no change in status, stable for surgery.  I have reviewed the patient's chart and labs.  Questions were answered to the patient's satisfaction.     Melrose Nakayama

## 2015-12-03 NOTE — Anesthesia Procedure Notes (Signed)
Central Venous Catheter Insertion Performed by: anesthesiologist Patient location: OR. Preanesthetic checklist: patient identified, IV checked, site marked, risks and benefits discussed, surgical consent, monitors and equipment checked, pre-op evaluation, timeout performed and anesthesia consent Position: supine Landmarks identified Catheter size: 8 Fr Central line was placed.Double lumen Procedure performed without using ultrasound guided technique. Attempts: 1 Following insertion, dressing applied, line sutured and Biopatch. Post procedure assessment: blood return through all ports, free fluid flow and no air. Patient tolerated the procedure well with no immediate complications.

## 2015-12-03 NOTE — Transfer of Care (Signed)
Immediate Anesthesia Transfer of Care Note  Patient: Danielle [name]  Procedure(s) Performed: Procedure(s): VIDEO ASSISTED THORACOSCOPY (Left) BLEBECTOMY (Left) PLEURADESIS (Left)  Patient Location: PACU  Anesthesia Type:General  Level of Consciousness: awake, alert  and oriented  Airway & Oxygen Therapy: Patient Spontanous Breathing and Patient connected to face mask oxygen  Post-op Assessment: Report given to RN, Post -op Vital signs reviewed and stable and Patient moving all extremities  Post vital signs: Reviewed and stable  Last Vitals:  Vitals:   12/03/15 1253 12/03/15 1254  BP:  105/79  Pulse:  74  Resp: 18 (!) 25  Temp:      Last Pain:  Vitals:   12/03/15 0407  TempSrc: Oral  PainSc:       Patients Stated Pain Goal: 3 ([id number])  Complications: No apparent anesthesia complications

## 2015-12-03 NOTE — Care Management Important Message (Signed)
Important Message  Patient Details  Name: Danielle [name] MRN: [id number] Date of Birth: [birth year]   Medicare Important Message Given:  Yes    Nathen May 12/03/2015, 10:39 AM

## 2015-12-03 NOTE — Care Management Note (Signed)
Case Management Note Marvetta Gibbons RN, BSN Unit 2W-Case Manager [phone]  Patient Details  Name: Shinita Nicole MRN: [id number] Date of Birth: 10/07/1945  Subjective/Objective:  Pt admitted with spont. pntx- chest tube placement                Action/Plan: PTA pt lived at home- CM to follow for d/c needs  Expected Discharge Date:                  Expected Discharge Plan:  Home/Self Care  In-House Referral:     Discharge planning Services  CM Consult  Post Acute Care Choice:    Choice offered to:     DME Arranged:    DME Agency:     HH Arranged:    HH Agency:     Status of Service:  In process, will continue to follow  If discussed at Long Length of Stay Meetings, dates discussed:  10/5  Additional Comments:  12/03/15- 1645- Jamis Kryder rN, CM- pt with persistent airleak- to OR today for VATS procedure- CM to follow for post op progression and d/c needs  Dahlia Client Romeo Rabon, RN 12/03/2015, 3:44 PM

## 2015-12-04 ENCOUNTER — Institutional Professional Consult (permissible substitution): Payer: Medicare Other | Admitting: Internal Medicine

## 2015-12-04 ENCOUNTER — Encounter (HOSPITAL_COMMUNITY): Payer: Self-pay | Admitting: General Practice

## 2015-12-04 ENCOUNTER — Inpatient Hospital Stay (HOSPITAL_COMMUNITY): Payer: Medicare Other

## 2015-12-04 LAB — BLOOD GAS, ARTERIAL
ACID-BASE DEFICIT: 1.7 mmol/L (ref 0.0–2.0)
BICARBONATE: 22 mmol/L (ref 20.0–28.0)
Drawn by: 25788
FIO2: 28
O2 SAT: 94.6 %
PCO2 ART: 34.2 mmHg (ref 32.0–48.0)
PO2 ART: 73.3 mmHg — AB (ref 83.0–108.0)
Patient temperature: 98.6
pH, Arterial: 7.425 (ref 7.350–7.450)

## 2015-12-04 LAB — GLUCOSE, CAPILLARY
GLUCOSE-CAPILLARY: 126 mg/dL — AB (ref 65–99)
GLUCOSE-CAPILLARY: 100 mg/dL — AB (ref 65–99)
Glucose-Capillary: 101 mg/dL — ABNORMAL HIGH (ref 65–99)
Glucose-Capillary: 118 mg/dL — ABNORMAL HIGH (ref 65–99)
Glucose-Capillary: 124 mg/dL — ABNORMAL HIGH (ref 65–99)
Glucose-Capillary: 94 mg/dL (ref 65–99)
Glucose-Capillary: 98 mg/dL (ref 65–99)

## 2015-12-04 LAB — BASIC METABOLIC PANEL
Anion gap: 10 (ref 5–15)
BUN: 12 mg/dL (ref 6–20)
CALCIUM: 8.9 mg/dL (ref 8.9–10.3)
CO2: 24 mmol/L (ref 22–32)
CREATININE: 0.88 mg/dL (ref 0.44–1.00)
Chloride: 102 mmol/L (ref 101–111)
GFR calc non Af Amer: >60 mL/min (ref >60)
GLUCOSE: 95 mg/dL (ref 65–99)
Potassium: 4.2 mmol/L (ref 3.5–5.1)
Sodium: 136 mmol/L (ref 135–145)

## 2015-12-04 LAB — CBC
HCT: 37.2 % (ref 36.0–46.0)
Hemoglobin: 12 g/dL (ref 12.0–15.0)
MCH: 30.8 pg (ref 26.0–34.0)
MCHC: 32.3 g/dL (ref 30.0–36.0)
MCV: 95.6 fL (ref 78.0–100.0)
PLATELETS: 108 10*3/uL — AB (ref 150–400)
RBC: 3.89 MIL/uL (ref 3.87–5.11)
RDW: 12.7 % (ref 11.5–15.5)
WBC: 8.8 10*3/uL (ref 4.0–10.5)

## 2015-12-04 MED ORDER — LEVALBUTEROL HCL 0.63 MG/3ML IN NEBU: 0.6300 mg | INHALATION_SOLUTION | Freq: Four times a day (QID) | RESPIRATORY_TRACT | Status: DC | PRN

## 2015-12-04 MED ORDER — CARVEDILOL 6.25 MG PO TABS
6.2500 mg | ORAL_TABLET | Freq: Two times a day (BID) | ORAL | Status: DC
  Administered 2015-12-04 – 2015-12-09 (×12): 6.25 mg via ORAL
  Filled 2015-12-04 (×12): qty 1

## 2015-12-04 MED ORDER — MOMETASONE FURO-FORMOTEROL FUM 200-5 MCG/ACT IN AERO: 2.0000 | INHALATION_SPRAY | Freq: Two times a day (BID) | RESPIRATORY_TRACT | Status: DC

## 2015-12-04 MED ORDER — SENNOSIDES-DOCUSATE SODIUM 8.6-50 MG PO TABS
1.0000 | ORAL_TABLET | Freq: Two times a day (BID) | ORAL | Status: DC
  Administered 2015-12-04 – 2015-12-09 (×8): 1 via ORAL
  Filled 2015-12-04 (×8): qty 1

## 2015-12-04 MED ORDER — INSULIN ASPART 100 UNIT/ML ~~LOC~~ SOLN
0.0000 [IU] | Freq: Three times a day (TID) | SUBCUTANEOUS | Status: DC
  Administered 2015-12-04: 2 [IU] via SUBCUTANEOUS

## 2015-12-04 MED ORDER — METHIMAZOLE 5 MG PO TABS
15.0000 mg | ORAL_TABLET | Freq: Every evening | ORAL | Status: DC
  Administered 2015-12-04 – 2015-12-09 (×6): 15 mg via ORAL
  Filled 2015-12-04 (×6): qty 1

## 2015-12-04 NOTE — Op Note (Signed)
NAMEALFREIDA, [name] ACCOUNT NO.:  [id number]  MEDICAL RECORD NO.:  [id number]  LOCATION:  [id number]                        FACILITY:  [facility]  PHYSICIAN:  Revonda Standard. Roxan Hockey, M.D.DATE OF BIRTH:  11-Nov-1945  DATE OF PROCEDURE:  12/03/2015 DATE OF DISCHARGE:                              OPERATIVE REPORT   PREOPERATIVE DIAGNOSIS:  Left spontaneous pneumothorax with ongoing air leak.  POSTOPERATIVE DIAGNOSIS:  Left spontaneous pneumothorax with ongoing air leak.  PROCEDURES:  Left video-assisted thoracoscopy, resection of blebs from apex and superior segment of the left lower lobe, pleural abrasion.  SURGEON:  Revonda Standard. Roxan Hockey, M.D.  ASSISTANCE:  Nicholes Rough, PA-C  ANESTHESIA:  General.  FINDINGS:  Severe bleb disease of the apex with an obvious ruptured bleb, extensive blebs on superior segment of left lower lobe.  CLINICAL NOTE:  Danielle [name] is a 70 year old woman with history of a right spontaneous pneumothorax, requiring VATS for apical blebectomy.  She presented with a left spontaneous pneumothorax.  A chest tube was placed and there was good re-expansion of the lung, but she had a persistent ongoing air leak.  CT scan showed severe bleb disease involving the apex as well as the superior segment of the lower lobe.  She was advised to undergo left VATS, with blebectomy and pleural abrasion.  The indications, risks, benefits and alternatives were discussed in detail with the patient.  She understood and accepted the risks and agreed to proceed.  OPERATIVE NOTE:  Danielle [name] was brought to the operating room on December 03, 2015.  She had induction of general anesthesia and was intubated with a double-lumen endotracheal tube.  Foley catheter was placed.  Sequential compression devices were placed on the calves for DVT prophylaxis.  Intravenous antibiotics were administered.  She was placed in the right lateral decubitus position.  Single  lung ventilation of the right lung was initiated and was tolerated well throughout the procedure.  There was no air leak with single lung ventilation, so the chest tube was removed.  The left chest was prepped and draped in usual sterile fashion.  An incision was made in the midaxillary line in the seventh intercostal space.  A 5-mm port was inserted into the chest. There was good isolation of the left lung.  A working incision was made in the fourth interspace anterolaterally.  No rib spreading was performed during the procedure.  There were some adhesions at the apex, which were taken down.  There were multiple large blebs at the apex. There was an obvious ruptured bleb with an approximately 4-mm hole.  An apical blebectomy was performed with sequential firings of an [employer] Echelon endoscopic powered stapler using 60-mm gold cartridges. Additional adhesions were taken down.  There were blebs along the anterior aspect of the upper lobe, which were resected with the stapler as well.  There also were multiple blebs in the superior segment of the lower lobe.  These were likewise removed.  There was good hemostasis at all the staple lines.  The parietal pleura then was lightly abraded over its entire surface to promote adhesion formation.  A second port incision was made anterior to the first and two 28-French chest tubes were placed through the port incisions and secured the skin with #1 silk  sutures.  One was placed more anteriorly and one more posteriorly. Thoracoscope was placed through the incision.  The lung was reinflated. There was good expansion of the upper and lower lobes.  The chest tubes were placed to suction.  The patient was placed back in a supine position.  She was extubated in the operating room and taken to the postanesthetic care unit in good condition.     Revonda Standard Roxan Hockey, M.D.     SCH/MEDQ  D:  12/03/2015  T:  12/04/2015  Job:  [id number]

## 2015-12-04 NOTE — Care Management Note (Signed)
Case Management Note  Patient Details  Name: Danielle [name] MRN: [id number] Date of Birth: Oct 23, 1945  Subjective/Objective:   Patient is from home with spouse, she says she needs a rollator to help her ambulate.  She is s/p vats , has chest  X 1.  She has PCP, she has medication coverage and transportation at dc.  Patient thinks she may need HHPT at discharge.  [facility] before and if she needs it she would like [facility].   MD if you feel this is appropriate please order pt eval.  NCM will cont to follow for dc needs.                  Action/Plan:   Expected Discharge Date:                  Expected Discharge Plan:  [place]  In-House Referral:     Discharge planning Services  CM Consult  Post Acute Care Choice:    Choice offered to:     DME Arranged:    DME Agency:     HH Arranged:    [place] Agency:     Status of Service:  In process, will continue to follow  If discussed at Long Length of Stay Meetings, dates discussed:    Additional Comments:  Zenon Mayo, RN 12/04/2015, 4:06 PM

## 2015-12-04 NOTE — Progress Notes (Signed)
1 Day Post-Op Procedure(s) (LRB): VIDEO ASSISTED THORACOSCOPY (Left) BLEBECTOMY (Left) PLEURADESIS (Left) Subjective: Some pain left chest- using PCA Denies nausea  Objective: Vital signs in last 24 hours: Temp:  [97.5 F (36.4 C)-99 F (37.2 C)] 98.9 F (37.2 C) (10/05 0336) Pulse Rate:  [72-106] 88 (10/05 0336) Cardiac Rhythm: Normal sinus rhythm (10/05 0336) Resp:  [14-30] 19 (10/05 0412) BP: (101-129)/(64-86) 115/84 (10/05 0336) SpO2:  [89 %-100 %] 97 % (10/05 0412) Arterial Line BP: (111-156)/(78-97) 146/80 (10/04 1745) Weight:  [214 lb 1.1 oz (97.1 kg)] 214 lb 1.1 oz (97.1 kg) (10/04 1738)  Hemodynamic parameters for last 24 hours:    Intake/Output from previous day: 10/04 0701 - 10/05 0700 In: 2000 [I.V.:2000] Out: 1925 [Urine:1550; Blood:75; Chest Tube:300] Intake/Output this shift: No intake/output data recorded.  General appearance: alert, cooperative and no distress Neurologic: intact Heart: regular rate and rhythm Lungs: diminished breath sounds bibasilar Abdomen: normal findings: soft, non-tender no air leak  Lab Results:  Recent Labs  12/02/15 1107 12/04/15 0431  WBC 6.8 8.8  HGB 12.9 12.0  HCT 40.6 37.2  PLT 112* 108*   BMET:  Recent Labs  12/02/15 1107 12/04/15 0431  NA 139 136  K 4.7 4.2  CL 108 102  CO2 28 24  GLUCOSE 94 95  BUN 23* 12  CREATININE 1.01* 0.88  CALCIUM 9.7 8.9    PT/INR:  Recent Labs  12/03/15 0219  LABPROT 15.5*  INR 1.23   ABG    Component Value Date/Time   PHART 7.425 12/04/2015 0436   HCO3 22.0 12/04/2015 0436   ACIDBASEDEF 1.7 12/04/2015 0436   O2SAT 94.6 12/04/2015 0436   CBG (last 3)   Recent Labs  12/03/15 2047 12/04/15 0003 12/04/15 0335  GLUCAP 98 118* 94    Assessment/Plan: S/P Procedure(s) (LRB): VIDEO ASSISTED THORACOSCOPY (Left) BLEBECTOMY (Left) PLEURADESIS (Left) -  POD # 1 blebectomy-  No air leak- will dc posterior tube and place anterior tube to water seal Moderate  output- 300 ml since surgery- will hold off one more day on restarting coumadin Continue enoxaparin + SCD Labs OK Change CBG to AC/HS ambulate   LOS: 6 days    Melrose Nakayama 12/04/2015

## 2015-12-04 NOTE — Anesthesia Postprocedure Evaluation (Signed)
Anesthesia Post Note  Patient: Danielle [name]  Procedure(s) Performed: Procedure(s) (LRB): VIDEO ASSISTED THORACOSCOPY (Left) BLEBECTOMY (Left) PLEURADESIS (Left)  Patient location during evaluation: PACU Anesthesia Type: General Level of consciousness: awake and alert Pain management: pain level controlled Vital Signs Assessment: post-procedure vital signs reviewed and stable Respiratory status: spontaneous breathing, nonlabored ventilation and respiratory function stable Cardiovascular status: blood pressure returned to baseline and stable Postop Assessment: no signs of nausea or vomiting Anesthetic complications: no    Last Vitals:  Vitals:   12/04/15 2000 12/04/15 2006  BP:  113/76  Pulse:  100  Resp: 18 16  Temp:  37.3 C    Last Pain:  Vitals:   12/04/15 2006  TempSrc: Oral  PainSc:                  Milon Dethloff A

## 2015-12-05 ENCOUNTER — Inpatient Hospital Stay (HOSPITAL_COMMUNITY): Payer: Medicare Other

## 2015-12-05 LAB — GLUCOSE, CAPILLARY
GLUCOSE-CAPILLARY: 105 mg/dL — AB (ref 65–99)
Glucose-Capillary: 86 mg/dL (ref 65–99)
Glucose-Capillary: 96 mg/dL (ref 65–99)
Glucose-Capillary: 116 mg/dL — ABNORMAL HIGH (ref 65–99)

## 2015-12-05 LAB — COMPREHENSIVE METABOLIC PANEL
ALT: 14 U/L (ref 14–54)
AST: 20 U/L (ref 15–41)
Albumin: 2.6 g/dL — ABNORMAL LOW (ref 3.5–5.0)
Alkaline Phosphatase: 78 U/L (ref 38–126)
Anion gap: 7 (ref 5–15)
BUN: 12 mg/dL (ref 6–20)
CHLORIDE: 105 mmol/L (ref 101–111)
CO2: 25 mmol/L (ref 22–32)
CREATININE: 0.92 mg/dL (ref 0.44–1.00)
Calcium: 9.2 mg/dL (ref 8.9–10.3)
GFR calc non Af Amer: >60 mL/min (ref >60)
Glucose, Bld: 113 mg/dL — ABNORMAL HIGH (ref 65–99)
Potassium: 3.9 mmol/L (ref 3.5–5.1)
SODIUM: 137 mmol/L (ref 135–145)
Total Bilirubin: 0.9 mg/dL (ref 0.3–1.2)
Total Protein: 5.4 g/dL — ABNORMAL LOW (ref 6.5–8.1)

## 2015-12-05 LAB — CBC
HCT: 38 % (ref 36.0–46.0)
Hemoglobin: 12.2 g/dL (ref 12.0–15.0)
MCH: 30.8 pg (ref 26.0–34.0)
MCHC: 32.1 g/dL (ref 30.0–36.0)
MCV: 96 fL (ref 78.0–100.0)
PLATELETS: 103 10*3/uL — AB (ref 150–400)
RBC: 3.96 MIL/uL (ref 3.87–5.11)
RDW: 12.8 % (ref 11.5–15.5)
WBC: 9.7 10*3/uL (ref 4.0–10.5)

## 2015-12-05 NOTE — Progress Notes (Signed)
2 Days Post-Op Procedure(s) (LRB): VIDEO ASSISTED THORACOSCOPY (Left) BLEBECTOMY (Left) PLEURADESIS (Left) Subjective: Feels better. Still some pain from tube  Objective: Vital signs in last 24 hours: Temp:  [98.1 F (36.7 C)-100.6 F (38.1 C)] 98.9 F (37.2 C) (10/06 0350) Pulse Rate:  [94-105] 94 (10/06 0350) Cardiac Rhythm: Sinus tachycardia (10/05 1952) Resp:  [16-26] 22 (10/06 0350) BP: (90-115)/(75-94) 115/84 (10/06 0350) SpO2:  [92 %-95 %] 94 % (10/06 0350)  Hemodynamic parameters for last 24 hours:    Intake/Output from previous day: 10/05 0701 - 10/06 0700 In: 2199.5 [P.O.:822; I.V.:1377.5] Out: 1545 [id number]; Chest Tube:170] Intake/Output this shift: No intake/output data recorded.  General appearance: alert, cooperative and no distress Neurologic: intact Heart: regular rate and rhythm Lungs: air movement on left o/w clear tidal variation in tube  Lab Results:  Recent Labs  12/04/15 0431 12/05/15 0357  WBC 8.8 9.7  HGB 12.0 12.2  HCT 37.2 38.0  PLT 108* 103*   BMET:  Recent Labs  12/04/15 0431 12/05/15 0357  NA 136 137  K 4.2 3.9  CL 102 105  CO2 24 25  GLUCOSE 95 113*  BUN 12 12  CREATININE 0.88 0.92  CALCIUM 8.9 9.2    PT/INR:  Recent Labs  12/03/15 0219  LABPROT 15.5*  INR 1.23   ABG    Component Value Date/Time   PHART 7.425 12/04/2015 0436   HCO3 22.0 12/04/2015 0436   ACIDBASEDEF 1.7 12/04/2015 0436   O2SAT 94.6 12/04/2015 0436   CBG (last 3)   Recent Labs  12/04/15 1525 12/04/15 1725 12/04/15 2109  GLUCAP 126* 124* 100*    Assessment/Plan: S/P Procedure(s) (LRB): VIDEO ASSISTED THORACOSCOPY (Left) BLEBECTOMY (Left) PLEURADESIS (Left) -POD # 2 Minimal if any air leak but will leave tube today just to be sure Appetite OK Ambulating CBG well controlled thrombocytopenia- mild, stable on enoxaparin    LOS: 7 days    Melrose Nakayama 12/05/2015

## 2015-12-05 NOTE — Progress Notes (Signed)
Notified MD of results of Chest xray, patient had increased pain to left side, repeat xray ordered. Tilda Burrow Everhart

## 2015-12-05 NOTE — Care Management Important Message (Signed)
Important Message  Patient Details  Name: Danielle [name] MRN: [id number] Date of Birth: [birth year]   Medicare Important Message Given:  Yes    Nathen May 12/05/2015, 1:14 PM

## 2015-12-05 NOTE — Discharge Summary (Signed)
Physician Discharge Summary       [phone]       [place] [address]             [phone]    Patient ID: Danielle [name] MRN: [id number] DOB/AGE: [birth year] 70 y.o.  Admit date: 11/28/2015 Discharge date: 12/09/2015  Admission Diagnoses: Left spontaneous pneumothorax  Active Diagnoses:  1. Multiple lung blebs 2. Aortic stenosis-s/p bioprosthetic AVR 3. Asthma 4. Chronic systolic CHF (congestive heart failure) ([place]) 5. Coronary artery disease 6. Hyperlipidemia 7. Hypertension 8. Hyperthyroidism 9. 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 Stroke ([place]) 12. Pelvis fracture ([place])     Procedure (s):  1. 28 F CT placed left chest by Dr. Roxan Hockey on 11/28/2015. 2.  Left video-assisted thoracoscopy, resection of blebs from apex and superior segment of the left lower lobe, pleural abrasion by Dr. Roxan Hockey on 12/03/2015.  Pathology: 1. Lung, bleb(s) / bullae, Left apical - EMPHYSEMATOUS BLEBS. - NO EVIDENCE OF MALIGNANCY. 2. Lung, bleb(s) / bullae, Left upper #2 - EMPHYSEMATOUS BLEBS. - NO EVIDENCE OF MALIGNANCY. 3. Lung, wedge biopsy/resection, Superior segment of lower lobe - EMPHYSEMATOUS BLEB. - NO EVIDENCE OF MALIGNANCY.  History of Presenting Illness: This is a 70 yo woman with history of AS, s/p AVR in [place], CAD, chronic systolic heart failure, atrial fibrillation, stroke, asthma and right spontaneous pneumothorax. She noted left sided CP and SOB yesterday morning after a coughing spell. She presented today to ED. CXR showed a complete left pneumothorax. Pain has eased off but still short of breath. Dr. Roxan Hockey discussed the need for a left chest tube. This was done on 11/28/2015.  Brief Hospital Course:  There was an air leak after the placement of the chest tube. Chest remained to suction. Coumadin was held in case surgical intervention was required. Her air leak unfortunately worsened. CT of the chest showed  numerous lage blebs in the left lung. Therefore, Dr. Roxan Hockey discussed the need for left VATS, blebectomy, and pleural abrasion. Potential risks, benefits, and complications were discussed with the patient and she agreed to proceed with surgery. She underwent the aforementioned procedure on 12/03/2015. She remained afebrile and hemodynamically stable. A line and foley were removed early in her post op course. There was no air leak. Daily chest x rays were obtained and remained stable. Posterior chest tube was removed on 10/05 and remaining chest tube was placed to water seal. Remaining chest tube then appeared to have a small air leak. This did resolve and it was removed on 10/08. Follow up chest x ray showed a hydropneumothorax on the left amounting to approximately 10% of the lung volume and CHF with mild pulmonary vascular congestion, stable. Repeat chest x ray showed to be stable. She did have mild thrombocytopenia post op. Her last platelet count was 103,000. She is ambulating on room air. She is tolerating a diet. Her wounds are clean and dry. She is felt surgically stable for discharge today.    Latest Vital Signs: Blood pressure 95/84, pulse 96, temperature 98.5 F (36.9 C), temperature source Oral, resp. rate (!) 33, height 5\' 7"  (1.702 m), weight 214 lb 1.1 oz (97.1 kg), SpO2 96 %.   Physical Exam: General appearance: alert, cooperative and no distress Neurologic: intact Heart: regular rate and rhythm Lungs: clear to auscultation bilaterally Wound: clean and dry  Discharge Condition:Stable and discharged to home.  Recent laboratory studies:  Lab Results  Component Value Date   WBC 9.7 12/05/2015   HGB 12.2 12/05/2015  HCT 38.0 12/05/2015   MCV 96.0 12/05/2015   PLT 103 (L) 12/05/2015   Lab Results  Component Value Date   NA 137 12/05/2015   K 3.9 12/05/2015   CL 105 12/05/2015   CO2 25 12/05/2015   CREATININE 0.92 12/05/2015   GLUCOSE 113 (H) 12/05/2015    Diagnostic  Studies:   Ct Chest Wo Contrast  Result Date: 12/01/2015 CLINICAL DATA:  Followup pneumothorax. EXAM: CT CHEST WITHOUT CONTRAST TECHNIQUE: Multidetector CT imaging of the chest was performed following the standard protocol without IV contrast. COMPARISON:  Chest x-ray 11/30/2015 FINDINGS: Chest wall: No breast masses, supraclavicular or axillary lymphadenopathy. Small scattered lymph nodes are noted. Moderate subcutaneous emphysema on the left side. Surgical changes from bypass surgery. Multinodular thyroid goiter noted, left greater than right. Deviation of the trachea right rib is noted. Cardiovascular: The heart is normal in size. No pericardial effusion. Surgical changes from coronary artery bypass surgery and aortic valve replacement. Scattered aortic calcifications. Mild tortuosity. Three-vessel coronary artery calcifications are noted. Mediastinum/Nodes: Small scattered mediastinal and hilar lymph nodes. No mass or overt adenopathy. The esophagus is grossly normal. Lungs/Pleura: Moderate-sized left-sided pneumothorax estimated at 15-20%. The chest tube is in good position. Advanced bullous emphysema, left greater than right. Surgical changes noted at the right lung apex. Bibasilar atelectasis, left greater than right. Upper Abdomen: Numerous small layering gallstones in the gallbladder. Simple hepatic cysts. No upper abdominal mass or adenopathy. Musculoskeletal: No significant bony findings. IMPRESSION: 1. 15-20% left-sided pneumothorax despite the chest tube. 2. Advanced bullous emphysema, left greater than right. 3. Surgical scarring changes noted at the right lung apex. 4. Bibasilar atelectasis, left greater than right. 5. Surgical changes from bypass surgery and aortic valve replacement. 6. Cholelithiasis. Electronically Signed   By: Marijo Sanes M.D.   On: 12/01/2015 16:26   EXAM: CHEST  2 VIEW  COMPARISON:  Portable chest x-ray of December 07, 2015  FINDINGS: The lungs are hypoinflated.  There is an air-fluid level posteriorly and superiorly in the upper left pleural space consistent with a hydro pneumothorax. The interstitial markings of both lungs remain increased. The left-sided chest tube has been removed. The heart is mildly enlarged but stable. The central pulmonary vascularity is prominent but less engorged today. The patient has undergone previous median sternotomy. There is no pleural effusion. The left subclavian venous catheter tip projects at the junction of the right and left brachiocephalic veins. There are loops of mildly distended bowel in the upper abdomen.  IMPRESSION: Hydropneumothorax on the left amounting to approximately 10% of the lung volume.  CHF with mild pulmonary vascular congestion, stable.  These results were called by telephone at the time of interpretation on 12/08/2015 at 7:35 am to [facility], RN, who verbally acknowledged these results.   Electronically Signed   By: David  [name] M.D.   On: 12/08/2015 07:32  Discharge Medications:   Medication List    STOP taking these medications   acetaminophen 500 MG tablet Commonly known as:  TYLENOL   traMADol 50 MG tablet Commonly known as:  ULTRAM   valACYclovir 1000 MG tablet Commonly known as:  VALTREX     TAKE these medications   aspirin EC 81 MG tablet Take 81 mg by mouth daily.   carvedilol 6.25 MG tablet Commonly known as:  COREG Take 1 tablet (6.25 mg total) by mouth 2 (two) times daily with a meal. What changed:  how much to take  Another medication with the same name was removed.  Continue taking this medication, and follow the directions you see here.   LIPITOR 40 MG tablet Generic drug:  atorvastatin Take 40 mg by mouth at bedtime.   methimazole 5 MG tablet Commonly known as:  TAPAZOLE Take 15 mg by mouth every evening.   oxyCODONE 5 MG immediate release tablet Commonly known as:  Oxy IR/ROXICODONE Take 1-2 tablets (5-10 mg total) by mouth every  6 (six) hours as needed for severe pain.   PROAIR HFA 108 (90 Base) MCG/ACT inhaler Generic drug:  albuterol Inhale 2 puffs into the lungs every 6 (six) hours as needed for wheezing or shortness of breath.   senna-docusate 8.6-50 MG tablet Commonly known as:  Senokot-S Take 1 tablet by mouth 2 (two) times daily.   SYMBICORT 160-4.5 MCG/ACT inhaler Generic drug:  budesonide-formoterol Inhale 2 puffs into the lungs 2 (two) times daily.   warfarin 5 MG tablet Commonly known as:  COUMADIN Take 1 tablet (5 mg total) by mouth daily at 6 PM. What changed:  how much to take  when to take this  additional instructions  Another medication with the same name was removed. Continue taking this medication, and follow the directions you see here.   ZANTAC 150 MG tablet Generic drug:  ranitidine Take 150 mg by mouth 2 (two) times daily.       Follow Up Appointments: Follow-up Information    Melrose Nakayama, MD Follow up on 12/23/2015.   Specialty:  Cardiothoracic Surgery Why:  PA/LAT CXR to be taken (at [facility] which is in the same building as Dr. Leonarda Salon office) on 12/23/2015 at 9:30 am;Appointment time is at 10:00 am Contact information: [place]  [id number] [phone]           Signed: Jadene Pierini EPA-C 12/09/2015, 11:59 AM

## 2015-12-05 NOTE — Care Management Note (Signed)
Case Management Note  Patient Details  Name: Danielle [name] MRN: [id number] Date of Birth: [birth year]  Subjective/Objective:   Patient is from home with spouse, she says she needs a rollator to help her ambulate.  She is s/p vats , has chest  X 1.  She has PCP, she has medication coverage and transportation at dc.  Patient thinks she may need HHPT at discharge.  She [facility] before and if she needs it she would like [facility].   MD if you feel this is appropriate please order pt eval.  NCM will cont to follow for dc needs.    10/6- NCM made referral to [facility] with [facility] for rollator. They will bring to patient's room at discharge .  Please call [facility] for rollator at dc.                  Action/Plan:   Expected Discharge Date:                  Expected Discharge Plan:  [place]  In-House Referral:     Discharge planning Services  CM Consult  Post Acute Care Choice:  Durable Medical Equipment Choice offered to:  Patient  DME Arranged:  [place] rolling with seat DME Agency:  [place]:    [place] Agency:     Status of Service:  In process, will continue to follow  If discussed at Long Length of Stay Meetings, dates discussed:    Additional Comments:  Zenon Mayo, RN 12/05/2015, 5:03 PM

## 2015-12-05 NOTE — Discharge Instructions (Signed)
Thoracoscopy, Care After °Refer to this sheet in the next few weeks. These instructions provide you with information about caring for yourself after your procedure. Your health care provider may also give you more specific instructions. Your treatment has been planned according to current medical practices, but problems sometimes occur. Call your health care provider if you have any problems or questions after your procedure. °WHAT TO EXPECT AFTER THE PROCEDURE: °After your procedure, it is common to feel sore for up to two weeks. °HOME CARE INSTRUCTIONS °· There are many different ways to close and cover an incision, including stitches (sutures), skin glue, and adhesive strips. Follow your health care provider's instructions about: °¨ Incision care. °¨ Bandage (dressing) changes and removal. °¨ Incision closure removal. °· Check your incision area every day for signs of infection. Watch for: °¨ Redness, swelling, or pain. °¨ Fluid, blood, or pus. °· Take medicines only as directed by your health care provider. °· Try to cough often. Coughing helps to protect against lung infection (pneumonia). It may hurt to cough. If this happens, hold a pillow against your chest when you cough. °· Take deep breaths. This also helps to protect against pneumonia. °· If you were given an incentive spirometer, use it as directed by your health care provider. °· Do not take baths, swim, or use a hot tub until your health care provider approves. You may take showers. °· Avoid lifting until your health care provider approves. °· Avoid driving until your health care provider approves. °· Do not travel by airplane after the chest tube is removed until your health care provider approves. °SEEK MEDICAL CARE IF: °· You have a fever. °· Pain medicines do not ease your pain. °· You have redness, swelling, or increasing pain in your incision area. °· You develop a cough that does not go away, or you are coughing up mucus that is yellow or  green. °SEEK IMMEDIATE MEDICAL CARE IF: °· You have fluid, blood, or pus coming from your incision. °· There is a bad smell coming from your incision or dressing. °· You develop a rash. °· You have difficulty breathing. °· You cough up blood. °· You develop light-headedness or you feel faint. °· You develop chest pain. °· Your heartbeat feels irregular or very fast. °  °This information is not intended to replace advice given to you by your health care provider. Make sure you discuss any questions you have with your health care provider. °  °Document Released: 09/04/2004 Document Revised: 03/08/2014 Document Reviewed: 10/31/2013 °Elsevier Interactive Patient Education ©2016 [employer] ° °

## 2015-12-06 ENCOUNTER — Inpatient Hospital Stay (HOSPITAL_COMMUNITY): Payer: Medicare Other

## 2015-12-06 LAB — GLUCOSE, CAPILLARY
GLUCOSE-CAPILLARY: 81 mg/dL (ref 65–99)
GLUCOSE-CAPILLARY: 89 mg/dL (ref 65–99)
GLUCOSE-CAPILLARY: 93 mg/dL (ref 65–99)
Glucose-Capillary: 78 mg/dL (ref 65–99)

## 2015-12-06 MED ORDER — POLYETHYLENE GLYCOL 3350 17 G PO PACK
17.0000 g | PACK | Freq: Every day | ORAL | Status: DC
  Administered 2015-12-08: 17 g via ORAL
  Filled 2015-12-06: qty 1

## 2015-12-06 NOTE — Progress Notes (Addendum)
      [address]       [place] [address]             [phone]      3 Days Post-Op Procedure(s) (LRB): VIDEO ASSISTED THORACOSCOPY (Left) BLEBECTOMY (Left) PLEURADESIS (Left) Subjective: No complaints this morning. Would like the chest tube out.   Objective: Vital signs in last 24 hours: Temp:  [98.2 F (36.8 C)-99.2 F (37.3 C)] 99.1 F (37.3 C) (10/07 1100) Pulse Rate:  [80-104] 93 (10/07 1200) Cardiac Rhythm: Normal sinus rhythm (10/07 1200) Resp:  [16-37] 26 (10/07 1200) BP: (115-140)/(62-106) 116/84 (10/07 1100) SpO2:  [92 %-98 %] 98 % (10/07 1200)     Intake/Output from previous day: 10/06 0701 - 10/07 0700 In: 1370 [P.O.:720; I.V.:650] Out: 885 [Urine:725; Chest Tube:160] Intake/Output this shift: Total I/O In: 220 [P.O.:120; I.V.:100] Out: 20 [Chest Tube:20]  General appearance: alert, cooperative and no distress Heart: regular rate and rhythm, S1, S2 normal, no murmur, click, rub or gallop Lungs: clear to auscultation bilaterally Abdomen: soft, non-tender; bowel sounds normal; no masses,  no organomegaly Extremities: extremities normal, atraumatic, no cyanosis or edema Wound: clean and dry  Lab Results:  Recent Labs  12/04/15 0431 12/05/15 0357  WBC 8.8 9.7  HGB 12.0 12.2  HCT 37.2 38.0  PLT 108* 103*   BMET:  Recent Labs  12/04/15 0431 12/05/15 0357  NA 136 137  K 4.2 3.9  CL 102 105  CO2 24 25  GLUCOSE 95 113*  BUN 12 12  CREATININE 0.88 0.92  CALCIUM 8.9 9.2    PT/INR: No results for input(s): LABPROT, INR in the last 72 hours. ABG    Component Value Date/Time   PHART 7.425 12/04/2015 0436   HCO3 22.0 12/04/2015 0436   ACIDBASEDEF 1.7 12/04/2015 0436   O2SAT 94.6 12/04/2015 0436   CBG (last 3)   Recent Labs  12/05/15 2158 12/06/15 0749 12/06/15 1154  GLUCAP 116* 81 78    Assessment/Plan: S/P Procedure(s) (LRB): VIDEO ASSISTED THORACOSCOPY (Left) BLEBECTOMY (Left) PLEURADESIS (Left)  POD  #3  CXR this morning showing small left apical pneumothorax which has improved from yesterdays study. No chest tube leak. Will discuss changing from suction to waterseal today with attending and obtaining a CXR to ensure no air accumulation.  Encourage ambulation. Encouraged hourly Is. CBG well controlled.    LOS: 8 days    Elgie Collard 12/06/2015   To get Miralix  compliant  of constipation Chest tube now on water seal, no air lek today will consider d/c in am if no change in apical ptx I have seen and examined Danielle [name] and agree with the above assessment  and plan.   Grace Isaac MD Beeper [phone] Office [phone] 12/06/2015 2:25 PM

## 2015-12-07 ENCOUNTER — Inpatient Hospital Stay (HOSPITAL_COMMUNITY): Payer: Medicare Other

## 2015-12-07 LAB — GLUCOSE, CAPILLARY
GLUCOSE-CAPILLARY: 102 mg/dL — AB (ref 65–99)
Glucose-Capillary: 108 mg/dL — ABNORMAL HIGH (ref 65–99)
Glucose-Capillary: 96 mg/dL (ref 65–99)
Glucose-Capillary: 87 mg/dL (ref 65–99)

## 2015-12-07 NOTE — Progress Notes (Addendum)
      [address]       [place] [address]             [phone]      4 Days Post-Op Procedure(s) (LRB): VIDEO ASSISTED THORACOSCOPY (Left) BLEBECTOMY (Left) PLEURADESIS (Left) Subjective: No issues overnight. Happy to get the chest tube out.   Objective: Vital signs in last 24 hours: Temp:  [97.7 F (36.5 C)-98.6 F (37 C)] 98.1 F (36.7 C) (10/08 0727) Pulse Rate:  [73-101] 87 (10/08 0727) Cardiac Rhythm: Normal sinus rhythm (10/08 0732) Resp:  [16-28] 18 (10/08 1200) BP: (124-131)/(65-90) 129/83 (10/08 0727) SpO2:  [96 %-98 %] 97 % (10/08 0845)     Intake/Output from previous day: 10/07 0701 - 10/08 0700 In: 1610 [P.O.:360; I.V.:1250] Out: 720 [Urine:700; Chest Tube:20] Intake/Output this shift: Total I/O In: -  Out: 460 [Urine:400; Chest Tube:60]  General appearance: alert, cooperative and no distress Heart: regular rate and rhythm, S1, S2 normal, no murmur, click, rub or gallop Lungs: clear to auscultation bilaterally Abdomen: soft, non-tender; bowel sounds normal; no masses,  no organomegaly Extremities: extremities normal, atraumatic, no cyanosis or edema Wound: clean and dry  Lab Results:  Recent Labs  12/05/15 0357  WBC 9.7  HGB 12.2  HCT 38.0  PLT 103*   BMET:  Recent Labs  12/05/15 0357  NA 137  K 3.9  CL 105  CO2 25  GLUCOSE 113*  BUN 12  CREATININE 0.92  CALCIUM 9.2    PT/INR: No results for input(s): LABPROT, INR in the last 72 hours. ABG    Component Value Date/Time   PHART 7.425 12/04/2015 0436   HCO3 22.0 12/04/2015 0436   ACIDBASEDEF 1.7 12/04/2015 0436   O2SAT 94.6 12/04/2015 0436   CBG (last 3)   Recent Labs  12/06/15 1716 12/06/15 2128 12/07/15 0852  GLUCAP 89 93 102*    Assessment/Plan: S/P Procedure(s) (LRB): VIDEO ASSISTED THORACOSCOPY (Left) BLEBECTOMY (Left) PLEURADESIS (Left)  CXR this morning showing stable small left apical pneumothorax. No chest tube leak. Discontinue chest  tube.  Encourage ambulation. Encouraged hourly Is. CBG well controlled.    LOS: 9 days    Elgie Collard 12/07/2015  Chest tube out today Follow up chest xray in am I have seen and examined [name] and agree with the above assessment  and plan.  Grace Isaac MD Beeper [phone] Office [phone] 12/07/2015 12:33 PM

## 2015-12-08 ENCOUNTER — Inpatient Hospital Stay (HOSPITAL_COMMUNITY): Payer: Medicare Other

## 2015-12-08 LAB — GLUCOSE, CAPILLARY: Glucose-Capillary: 91 mg/dL (ref 65–99)

## 2015-12-08 MED ORDER — POTASSIUM CHLORIDE CRYS ER 20 MEQ PO TBCR
20.0000 meq | EXTENDED_RELEASE_TABLET | Freq: Once | ORAL | Status: AC
  Administered 2015-12-08: 20 meq via ORAL
  Filled 2015-12-08: qty 1

## 2015-12-08 MED ORDER — FUROSEMIDE 40 MG PO TABS
40.0000 mg | ORAL_TABLET | Freq: Every day | ORAL | Status: DC
  Administered 2015-12-08: 40 mg via ORAL
  Filled 2015-12-08 (×2): qty 1

## 2015-12-08 NOTE — Progress Notes (Addendum)
      [address]       [place] [address]             [phone]       5 Days Post-Op Procedure(s) (LRB): VIDEO ASSISTED THORACOSCOPY (Left) BLEBECTOMY (Left) PLEURADESIS (Left)  Subjective: Patient about to eat breakfast. No complaints.  Objective: Vital signs in last 24 hours: Temp:  [98.2 F (36.8 C)-99.1 F (37.3 C)] 98.7 F (37.1 C) (10/09 0228) Pulse Rate:  [87-96] 92 (10/09 0228) Cardiac Rhythm: Normal sinus rhythm (10/09 0700) Resp:  [12-23] 23 (10/09 0228) BP: (109-128)/(62-93) 109/76 (10/09 0228) SpO2:  [93 %-100 %] 96 % (10/09 0755)      Intake/Output from previous day: 10/08 0701 - 10/09 0700 In: 1705 [P.O.:1080; I.V.:625] Out: 760 [Urine:700; Chest Tube:60]   Physical Exam:  Cardiovascular: RRR Pulmonary: Slightly diminished left base. Wounds: Clean and dry.  No erythema or signs of infection.   Lab Results: CBC:No results for input(s): WBC, HGB, HCT, PLT in the last 72 hours. BMET: No results for input(s): NA, K, CL, CO2, GLUCOSE, BUN, CREATININE, CALCIUM in the last 72 hours.  PT/INR: No results for input(s): LABPROT, INR in the last 72 hours. ABG:  INR: Will add last result for INR, ABG once components are confirmed Will add last 4 CBG results once components are confirmed  Assessment/Plan:  1. CV - SR in the 90's 2.  Pulmonary - On room air. CXR this am shows about a 10% hydropneumothorax on the left, mild pulmonary vascular congestion. Will discuss with surgeon.  Encourage incentive spirometer.   ZIMMERMAN,DONIELLE MPA-C 12/08/2015,8:11 AM   Chart reviewed, patient examined, agree with above. Her CXR looks fine. Will repeat in the am. She has not walked yet today but did yesterday. If she walks well today and CXR stable she can go home tomorrow.

## 2015-12-09 ENCOUNTER — Inpatient Hospital Stay (HOSPITAL_COMMUNITY): Payer: Medicare Other

## 2015-12-09 MED ORDER — OXYCODONE HCL 5 MG PO TABS: 5.0000 mg | ORAL_TABLET | Freq: Four times a day (QID) | ORAL | 0 refills | Status: DC | PRN

## 2015-12-09 MED ORDER — WARFARIN SODIUM 5 MG PO TABS
5.0000 mg | ORAL_TABLET | Freq: Every day | ORAL | Status: DC
  Administered 2015-12-09: 5 mg via ORAL
  Filled 2015-12-09: qty 1

## 2015-12-09 MED ORDER — CARVEDILOL 6.25 MG PO TABS: 6.2500 mg | ORAL_TABLET | Freq: Two times a day (BID) | ORAL | 1 refills | Status: DC

## 2015-12-09 MED ORDER — WARFARIN SODIUM 5 MG PO TABS: 5.0000 mg | ORAL_TABLET | Freq: Every day | ORAL | 1 refills | Status: DC

## 2015-12-09 MED ORDER — WARFARIN - PHYSICIAN DOSING INPATIENT: Freq: Every day | Status: DC

## 2015-12-09 NOTE — Care Management Note (Signed)
Case Management Note  Patient Details  Name: Danielle [name] MRN: [id number] Date of Birth: [birth year]  Subjective/Objective:     Patient is from home with spouse, she says she needs a rollator to help her ambulate. She is s/p vats , has chest X 1. She has PCP, she has medication coverage and transportation at dc. Patient thinks she may need HHPT at discharge. She had [facility] before and if she needs it she would like [facility].  12/08/15 NCM spoke with patient she has signed up with [facility]  Per [place].  Patient mentioned that she thinks she needs a HHRN.  NCM  Asked patient why she think she needs HHRN, she states for her dressing, per RN patient has stiches on left side and all she need to do is to keep it clean and dry.   Patient states well that is good, she does not need a HHRN then.  Also she does not want the rollator because it is too small and per rep with [facility] she will have to be 300 pounds or more to get the bariatric rollator.  NCM will cont to follow for dc needs.                 Action/Plan:   Expected Discharge Date:                  Expected Discharge Plan:  Home/Self Care  In-House Referral:     Discharge planning Services  CM Consult  Post Acute Care Choice:    Choice offered to:  Patient  DME Arranged:    DME Agency:     HH Arranged:    [place] Agency:     Status of Service:  Completed, signed off  If discussed at [employer] of Stay Meetings, dates discussed:    Additional Comments:  Zenon Mayo, RN 12/09/2015, 3:02 PM

## 2015-12-09 NOTE — Progress Notes (Signed)
Received order to d/c patient.  Removed central line.  Discussed surgical site care with patient and spouse.  Provided education materials upon discharge and answered all questions.

## 2015-12-09 NOTE — Progress Notes (Signed)
Patient ambulated around unit using front wheel walker on room air.  Patient's HR elevated to 110s during ambulation, all other vitals remained within normal limits. Patient endorsed pain following ambulation although she was given pain medication some time prior to activity. This seemed to ease when she got back into bed. She states that she will get up to the chair for breakfast. Will continue to monitor.

## 2015-12-09 NOTE — Care Management Important Message (Signed)
Important Message  Patient Details  Name: Danielle [name] MRN: [id number] Date of Birth: 26-Nov-1945   Medicare Important Message Given:  Yes    York Valliant Abena 12/09/2015, 9:06 AM

## 2015-12-09 NOTE — Progress Notes (Signed)
6 Days Post-Op Procedure(s) (LRB): VIDEO ASSISTED THORACOSCOPY (Left) BLEBECTOMY (Left) PLEURADESIS (Left) Subjective: No complaints this AM Wants to stop lasix- was only taking it PRN at home Ambulated around unit this morning  Objective: Vital signs in last 24 hours: Temp:  [97.9 F (36.6 C)-99.8 F (37.7 C)] 97.9 F (36.6 C) (10/10 0708) Pulse Rate:  [72-136] 90 (10/10 0708) Cardiac Rhythm: Normal sinus rhythm (10/10 0740) Resp:  [18-30] 21 (10/10 0708) BP: (118-136)/(82-94) 118/86 (10/10 0708) SpO2:  [93 %-100 %] 97 % (10/10 0708)  Hemodynamic parameters for last 24 hours:    Intake/Output from previous day: 10/09 0701 - 10/10 0700 In: 600 [P.O.:600] Out: 2400 [Urine:2400] Intake/Output this shift: No intake/output data recorded.  General appearance: alert, cooperative and no distress Neurologic: intact Heart: regular rate and rhythm Lungs: clear to auscultation bilaterally Wound: clean and dry  Lab Results: No results for input(s): WBC, HGB, HCT, PLT in the last 72 hours. BMET: No results for input(s): NA, K, CL, CO2, GLUCOSE, BUN, CREATININE, CALCIUM in the last 72 hours.  PT/INR: No results for input(s): LABPROT, INR in the last 72 hours. ABG    Component Value Date/Time   PHART 7.425 12/04/2015 0436   HCO3 22.0 12/04/2015 0436   ACIDBASEDEF 1.7 12/04/2015 0436   O2SAT 94.6 12/04/2015 0436   CBG (last 3)   Recent Labs  12/07/15 1646 12/07/15 2103 12/08/15 0840  GLUCAP 96 87 91    Assessment/Plan: S/P Procedure(s) (LRB): VIDEO ASSISTED THORACOSCOPY (Left) BLEBECTOMY (Left) PLEURADESIS (Left) -Doing well CXR stable- air fluid level a little more prominent today but size of space unchanged Will stop daily lasix- she will resume taking it as needed Restart coumadin at 5 mg daily- she has an appointment with Cardiology next week  DC home   LOS: 11 days    Melrose Nakayama 12/09/2015

## 2015-12-10 ENCOUNTER — Inpatient Hospital Stay (HOSPITAL_COMMUNITY)
Admission: EM | Admit: 2015-12-10 | Discharge: 2015-12-15 | DRG: 481 | Disposition: A | Discharge status: Skilled Nursing Facility | Payer: Medicare Other | Attending: Internal Medicine | Admitting: Internal Medicine

## 2015-12-10 ENCOUNTER — Encounter (HOSPITAL_COMMUNITY): Payer: Self-pay

## 2015-12-10 ENCOUNTER — Inpatient Hospital Stay (HOSPITAL_COMMUNITY): Payer: Medicare Other | Admitting: Certified Registered Nurse Anesthetist

## 2015-12-10 ENCOUNTER — Other Ambulatory Visit: Payer: Self-pay

## 2015-12-10 ENCOUNTER — Inpatient Hospital Stay (HOSPITAL_COMMUNITY): Payer: Medicare Other

## 2015-12-10 ENCOUNTER — Emergency Department (HOSPITAL_COMMUNITY): Payer: Medicare Other

## 2015-12-10 ENCOUNTER — Encounter (HOSPITAL_COMMUNITY)
Admission: EM | Disposition: A | Discharge status: Skilled Nursing Facility | Payer: Self-pay | Source: Home / Self Care | Attending: Internal Medicine

## 2015-12-10 DIAGNOSIS — Z7983 Long term (current) use of bisphosphonates: Secondary | ICD-10-CM

## 2015-12-10 DIAGNOSIS — E0520 Thyrotoxicosis with toxic multinodular goiter without thyrotoxic crisis or storm: Secondary | ICD-10-CM | POA: Diagnosis present

## 2015-12-10 DIAGNOSIS — W010XXD Fall on same level from slipping, tripping and stumbling without subsequent striking against object, subsequent encounter: Secondary | ICD-10-CM | POA: Diagnosis not present

## 2015-12-10 DIAGNOSIS — R531 Weakness: Secondary | ICD-10-CM | POA: Diagnosis not present

## 2015-12-10 DIAGNOSIS — I2510 Atherosclerotic heart disease of native coronary artery without angina pectoris: Secondary | ICD-10-CM | POA: Diagnosis present

## 2015-12-10 DIAGNOSIS — D696 Thrombocytopenia, unspecified: Secondary | ICD-10-CM | POA: Diagnosis not present

## 2015-12-10 DIAGNOSIS — S72142A Displaced intertrochanteric fracture of left femur, initial encounter for closed fracture: Secondary | ICD-10-CM | POA: Diagnosis not present

## 2015-12-10 DIAGNOSIS — Z6833 Body mass index (BMI) 33.0-33.9, adult: Secondary | ICD-10-CM

## 2015-12-10 DIAGNOSIS — I5022 Chronic systolic (congestive) heart failure: Secondary | ICD-10-CM

## 2015-12-10 DIAGNOSIS — I959 Hypotension, unspecified: Secondary | ICD-10-CM | POA: Diagnosis not present

## 2015-12-10 DIAGNOSIS — S329XXA Fracture of unspecified parts of lumbosacral spine and pelvis, initial encounter for closed fracture: Secondary | ICD-10-CM | POA: Diagnosis not present

## 2015-12-10 DIAGNOSIS — E559 Vitamin D deficiency, unspecified: Secondary | ICD-10-CM | POA: Diagnosis not present

## 2015-12-10 DIAGNOSIS — E785 Hyperlipidemia, unspecified: Secondary | ICD-10-CM | POA: Diagnosis present

## 2015-12-10 DIAGNOSIS — J449 Chronic obstructive pulmonary disease, unspecified: Secondary | ICD-10-CM | POA: Diagnosis not present

## 2015-12-10 DIAGNOSIS — M6281 Muscle weakness (generalized): Secondary | ICD-10-CM | POA: Diagnosis not present

## 2015-12-10 DIAGNOSIS — Z9181 History of falling: Secondary | ICD-10-CM

## 2015-12-10 DIAGNOSIS — R338 Other retention of urine: Secondary | ICD-10-CM

## 2015-12-10 DIAGNOSIS — R2689 Other abnormalities of gait and mobility: Secondary | ICD-10-CM | POA: Diagnosis not present

## 2015-12-10 DIAGNOSIS — Z66 Do not resuscitate: Secondary | ICD-10-CM | POA: Diagnosis not present

## 2015-12-10 DIAGNOSIS — E0590 Thyrotoxicosis, unspecified without thyrotoxic crisis or storm: Secondary | ICD-10-CM | POA: Diagnosis present

## 2015-12-10 DIAGNOSIS — E669 Obesity, unspecified: Secondary | ICD-10-CM | POA: Diagnosis present

## 2015-12-10 DIAGNOSIS — I480 Paroxysmal atrial fibrillation: Secondary | ICD-10-CM

## 2015-12-10 DIAGNOSIS — D649 Anemia, unspecified: Secondary | ICD-10-CM

## 2015-12-10 DIAGNOSIS — Y92009 Unspecified place in unspecified non-institutional (private) residence as the place of occurrence of the external cause: Secondary | ICD-10-CM

## 2015-12-10 DIAGNOSIS — J439 Emphysema, unspecified: Secondary | ICD-10-CM | POA: Diagnosis present

## 2015-12-10 DIAGNOSIS — E782 Mixed hyperlipidemia: Secondary | ICD-10-CM | POA: Diagnosis present

## 2015-12-10 DIAGNOSIS — Z87891 Personal history of nicotine dependence: Secondary | ICD-10-CM

## 2015-12-10 DIAGNOSIS — M25552 Pain in left hip: Secondary | ICD-10-CM | POA: Diagnosis present

## 2015-12-10 DIAGNOSIS — W010XXA Fall on same level from slipping, tripping and stumbling without subsequent striking against object, initial encounter: Secondary | ICD-10-CM | POA: Diagnosis present

## 2015-12-10 DIAGNOSIS — S72145A Nondisplaced intertrochanteric fracture of left femur, initial encounter for closed fracture: Secondary | ICD-10-CM | POA: Diagnosis not present

## 2015-12-10 DIAGNOSIS — S72142B Displaced intertrochanteric fracture of left femur, initial encounter for open fracture type I or II: Secondary | ICD-10-CM | POA: Diagnosis not present

## 2015-12-10 DIAGNOSIS — D62 Acute posthemorrhagic anemia: Secondary | ICD-10-CM | POA: Diagnosis not present

## 2015-12-10 DIAGNOSIS — Z953 Presence of xenogenic heart valve: Secondary | ICD-10-CM

## 2015-12-10 DIAGNOSIS — I69354 Hemiplegia and hemiparesis following cerebral infarction affecting left non-dominant side: Secondary | ICD-10-CM

## 2015-12-10 DIAGNOSIS — R079 Chest pain, unspecified: Secondary | ICD-10-CM | POA: Diagnosis not present

## 2015-12-10 DIAGNOSIS — R404 Transient alteration of awareness: Secondary | ICD-10-CM | POA: Diagnosis not present

## 2015-12-10 DIAGNOSIS — J939 Pneumothorax, unspecified: Secondary | ICD-10-CM | POA: Diagnosis not present

## 2015-12-10 DIAGNOSIS — I110 Hypertensive heart disease with heart failure: Secondary | ICD-10-CM | POA: Diagnosis present

## 2015-12-10 DIAGNOSIS — S72102D Unspecified trochanteric fracture of left femur, subsequent encounter for closed fracture with routine healing: Secondary | ICD-10-CM | POA: Diagnosis not present

## 2015-12-10 DIAGNOSIS — S72009A Fracture of unspecified part of neck of unspecified femur, initial encounter for closed fracture: Secondary | ICD-10-CM | POA: Diagnosis present

## 2015-12-10 DIAGNOSIS — I10 Essential (primary) hypertension: Secondary | ICD-10-CM | POA: Diagnosis present

## 2015-12-10 DIAGNOSIS — Z419 Encounter for procedure for purposes other than remedying health state, unspecified: Secondary | ICD-10-CM

## 2015-12-10 DIAGNOSIS — S72002A Fracture of unspecified part of neck of left femur, initial encounter for closed fracture: Secondary | ICD-10-CM

## 2015-12-10 DIAGNOSIS — I700 Atherosclerosis of aorta: Secondary | ICD-10-CM | POA: Diagnosis not present

## 2015-12-10 DIAGNOSIS — Z5181 Encounter for therapeutic drug level monitoring: Secondary | ICD-10-CM | POA: Diagnosis not present

## 2015-12-10 DIAGNOSIS — Z79899 Other long term (current) drug therapy: Secondary | ICD-10-CM | POA: Diagnosis not present

## 2015-12-10 DIAGNOSIS — E569 Vitamin deficiency, unspecified: Secondary | ICD-10-CM | POA: Diagnosis not present

## 2015-12-10 DIAGNOSIS — I509 Heart failure, unspecified: Secondary | ICD-10-CM | POA: Diagnosis not present

## 2015-12-10 DIAGNOSIS — J45909 Unspecified asthma, uncomplicated: Secondary | ICD-10-CM | POA: Diagnosis present

## 2015-12-10 DIAGNOSIS — R509 Fever, unspecified: Secondary | ICD-10-CM

## 2015-12-10 DIAGNOSIS — N390 Urinary tract infection, site not specified: Secondary | ICD-10-CM | POA: Diagnosis not present

## 2015-12-10 DIAGNOSIS — E039 Hypothyroidism, unspecified: Secondary | ICD-10-CM | POA: Diagnosis not present

## 2015-12-10 DIAGNOSIS — R339 Retention of urine, unspecified: Secondary | ICD-10-CM | POA: Diagnosis not present

## 2015-12-10 DIAGNOSIS — D500 Iron deficiency anemia secondary to blood loss (chronic): Secondary | ICD-10-CM | POA: Diagnosis not present

## 2015-12-10 DIAGNOSIS — M810 Age-related osteoporosis without current pathological fracture: Secondary | ICD-10-CM | POA: Diagnosis not present

## 2015-12-10 DIAGNOSIS — W19XXXA Unspecified fall, initial encounter: Secondary | ICD-10-CM

## 2015-12-10 DIAGNOSIS — S72002D Fracture of unspecified part of neck of left femur, subsequent encounter for closed fracture with routine healing: Secondary | ICD-10-CM | POA: Diagnosis not present

## 2015-12-10 DIAGNOSIS — W19XXXD Unspecified fall, subsequent encounter: Secondary | ICD-10-CM | POA: Diagnosis not present

## 2015-12-10 HISTORY — PX: INTRAMEDULLARY (IM) NAIL INTERTROCHANTERIC: SHX5875

## 2015-12-10 LAB — CBC WITH DIFFERENTIAL/PLATELET
BASOS PCT: 0 %
Basophils Absolute: 0 10*3/uL (ref 0.0–0.1)
Eosinophils Absolute: 0.2 10*3/uL (ref 0.0–0.7)
Eosinophils Relative: 2 %
HEMATOCRIT: 35.4 % — AB (ref 36.0–46.0)
HEMOGLOBIN: 11.9 g/dL — AB (ref 12.0–15.0)
LYMPHS ABS: 1 10*3/uL (ref 0.7–4.0)
LYMPHS PCT: 9 %
MCH: 31.1 pg (ref 26.0–34.0)
MCHC: 33.6 g/dL (ref 30.0–36.0)
MCV: 92.4 fL (ref 78.0–100.0)
MONO ABS: 0.5 10*3/uL (ref 0.1–1.0)
MONOS PCT: 5 %
NEUTROS ABS: 9.1 10*3/uL — AB (ref 1.7–7.7)
NEUTROS PCT: 84 %
Platelets: 131 10*3/uL — ABNORMAL LOW (ref 150–400)
RBC: 3.83 MIL/uL — ABNORMAL LOW (ref 3.87–5.11)
RDW: 12.9 % (ref 11.5–15.5)
WBC: 10.8 10*3/uL — ABNORMAL HIGH (ref 4.0–10.5)

## 2015-12-10 LAB — PROTIME-INR
INR: 1.06
PROTHROMBIN TIME: 13.8 s (ref 11.4–15.2)

## 2015-12-10 LAB — COMPREHENSIVE METABOLIC PANEL
ALBUMIN: 2.9 g/dL — AB (ref 3.5–5.0)
ALK PHOS: 85 U/L (ref 38–126)
ALT: 34 U/L (ref 14–54)
ANION GAP: 8 (ref 5–15)
AST: 29 U/L (ref 15–41)
BILIRUBIN TOTAL: 0.8 mg/dL (ref 0.3–1.2)
BUN: 16 mg/dL (ref 6–20)
CALCIUM: 9.6 mg/dL (ref 8.9–10.3)
CO2: 22 mmol/L (ref 22–32)
Chloride: 109 mmol/L (ref 101–111)
Creatinine, Ser: 0.97 mg/dL (ref 0.44–1.00)
GFR calc Af Amer: >60 mL/min (ref >60)
GFR, EST NON AFRICAN AMERICAN: 58 mL/min — AB (ref >60)
GLUCOSE: 117 mg/dL — AB (ref 65–99)
Potassium: 4.2 mmol/L (ref 3.5–5.1)
Sodium: 139 mmol/L (ref 135–145)
TOTAL PROTEIN: 6.1 g/dL — AB (ref 6.5–8.1)

## 2015-12-10 LAB — TYPE AND SCREEN
ABO/RH(D): O POS
Antibody Screen: NEGATIVE

## 2015-12-10 LAB — SEDIMENTATION RATE: SED RATE: 54 mm/h — AB (ref 0–22)

## 2015-12-10 SURGERY — FIXATION, FRACTURE, INTERTROCHANTERIC, WITH INTRAMEDULLARY ROD
Anesthesia: General | Laterality: Left

## 2015-12-10 MED ORDER — MENTHOL 3 MG MT LOZG
1.0000 | LOZENGE | OROMUCOSAL | Status: DC | PRN
  Filled 2015-12-10: qty 9

## 2015-12-10 MED ORDER — METHOCARBAMOL 500 MG PO TABS
500.0000 mg | ORAL_TABLET | Freq: Four times a day (QID) | ORAL | Status: DC | PRN
  Administered 2015-12-13 – 2015-12-14 (×5): 500 mg via ORAL
  Filled 2015-12-10 (×5): qty 1

## 2015-12-10 MED ORDER — ONDANSETRON HCL 4 MG PO TABS
4.0000 mg | ORAL_TABLET | Freq: Four times a day (QID) | ORAL | Status: DC | PRN
  Administered 2015-12-15: 4 mg via ORAL
  Filled 2015-12-10: qty 1

## 2015-12-10 MED ORDER — SODIUM CHLORIDE 0.9 % IV SOLN
INTRAVENOUS | Status: DC
  Administered 2015-12-11 – 2015-12-14 (×3): via INTRAVENOUS

## 2015-12-10 MED ORDER — WARFARIN SODIUM 5 MG PO TABS: 5.0000 mg | ORAL_TABLET | Freq: Once | ORAL | Status: DC

## 2015-12-10 MED ORDER — MIDAZOLAM HCL 2 MG/2ML IJ SOLN
INTRAMUSCULAR | Status: AC
  Filled 2015-12-10: qty 2

## 2015-12-10 MED ORDER — WARFARIN - PHARMACIST DOSING INPATIENT
Freq: Every day | Status: DC
  Administered 2015-12-12: 18:00:00

## 2015-12-10 MED ORDER — SENNOSIDES-DOCUSATE SODIUM 8.6-50 MG PO TABS
1.0000 | ORAL_TABLET | Freq: Two times a day (BID) | ORAL | Status: DC
Start: 2015-12-10 — End: 2015-12-15
  Administered 2015-12-10 – 2015-12-15 (×9): 1 via ORAL
  Filled 2015-12-10 (×9): qty 1

## 2015-12-10 MED ORDER — 0.9 % SODIUM CHLORIDE (POUR BTL) OPTIME
TOPICAL | Status: DC | PRN
  Administered 2015-12-10: 1000 mL

## 2015-12-10 MED ORDER — MORPHINE SULFATE (PF) 4 MG/ML IV SOLN
4.0000 mg | INTRAVENOUS | Status: AC | PRN
  Administered 2015-12-10 – 2015-12-11 (×3): 4 mg via INTRAVENOUS
  Filled 2015-12-10 (×3): qty 1

## 2015-12-10 MED ORDER — WARFARIN SODIUM 5 MG PO TABS: 5.0000 mg | ORAL_TABLET | Freq: Every day | ORAL | 1 refills | Status: DC

## 2015-12-10 MED ORDER — FENTANYL CITRATE (PF) 100 MCG/2ML IJ SOLN
INTRAMUSCULAR | Status: DC | PRN
  Administered 2015-12-10 (×2): 50 ug via INTRAVENOUS
  Administered 2015-12-10: 100 ug via INTRAVENOUS

## 2015-12-10 MED ORDER — LIDOCAINE HCL (CARDIAC) 20 MG/ML IV SOLN
INTRAVENOUS | Status: DC | PRN
  Administered 2015-12-10: 100 mg via INTRAVENOUS

## 2015-12-10 MED ORDER — ONDANSETRON HCL 4 MG/2ML IJ SOLN
4.0000 mg | Freq: Four times a day (QID) | INTRAMUSCULAR | Status: DC | PRN
  Administered 2015-12-14: 4 mg via INTRAVENOUS
  Filled 2015-12-10: qty 2

## 2015-12-10 MED ORDER — LIDOCAINE 2% (20 MG/ML) 5 ML SYRINGE
INTRAMUSCULAR | Status: AC
  Filled 2015-12-10: qty 5

## 2015-12-10 MED ORDER — ONDANSETRON HCL 4 MG/2ML IJ SOLN
INTRAMUSCULAR | Status: DC | PRN
  Administered 2015-12-10: 4 mg via INTRAVENOUS

## 2015-12-10 MED ORDER — OXYCODONE HCL 5 MG PO TABS
5.0000 mg | ORAL_TABLET | Freq: Four times a day (QID) | ORAL | Status: DC | PRN
  Filled 2015-12-10 (×2): qty 2

## 2015-12-10 MED ORDER — PHENOL 1.4 % MT LIQD: 1.0000 | OROMUCOSAL | Status: DC | PRN

## 2015-12-10 MED ORDER — ROCURONIUM BROMIDE 10 MG/ML (PF) SYRINGE
PREFILLED_SYRINGE | INTRAVENOUS | Status: AC
  Filled 2015-12-10: qty 10

## 2015-12-10 MED ORDER — PHENYLEPHRINE 40 MCG/ML (10ML) SYRINGE FOR IV PUSH (FOR BLOOD PRESSURE SUPPORT)
PREFILLED_SYRINGE | INTRAVENOUS | Status: AC
  Filled 2015-12-10: qty 10

## 2015-12-10 MED ORDER — LACTATED RINGERS IV SOLN
INTRAVENOUS | Status: DC
Start: 2015-12-10 — End: 2015-12-15
  Administered 2015-12-10: 15:00:00 via INTRAVENOUS

## 2015-12-10 MED ORDER — ALBUTEROL SULFATE (2.5 MG/3ML) 0.083% IN NEBU: 3.0000 mL | INHALATION_SOLUTION | Freq: Four times a day (QID) | RESPIRATORY_TRACT | Status: DC | PRN

## 2015-12-10 MED ORDER — PHENYLEPHRINE HCL 10 MG/ML IJ SOLN
INTRAMUSCULAR | Status: DC | PRN
  Administered 2015-12-10: 80 ug via INTRAVENOUS
  Administered 2015-12-10: 40 ug via INTRAVENOUS
  Administered 2015-12-10 (×3): 80 ug via INTRAVENOUS

## 2015-12-10 MED ORDER — ASPIRIN EC 81 MG PO TBEC
81.0000 mg | DELAYED_RELEASE_TABLET | Freq: Every day | ORAL | Status: DC
  Administered 2015-12-11 – 2015-12-15 (×5): 81 mg via ORAL
  Filled 2015-12-10 (×5): qty 1

## 2015-12-10 MED ORDER — ACETAMINOPHEN 650 MG RE SUPP: 650.0000 mg | Freq: Four times a day (QID) | RECTAL | Status: DC | PRN

## 2015-12-10 MED ORDER — DEXTROSE 5 % IV SOLN
3.0000 g | Freq: Four times a day (QID) | INTRAVENOUS | Status: AC
  Administered 2015-12-10 – 2015-12-11 (×3): 3 g via INTRAVENOUS
  Filled 2015-12-10 (×3): qty 3000

## 2015-12-10 MED ORDER — LACTATED RINGERS IV SOLN
INTRAVENOUS | Status: DC | PRN
  Administered 2015-12-10: 15:00:00 via INTRAVENOUS

## 2015-12-10 MED ORDER — ALUM & MAG HYDROXIDE-SIMETH 200-200-20 MG/5ML PO SUSP: 30.0000 mL | ORAL | Status: DC | PRN

## 2015-12-10 MED ORDER — WARFARIN SODIUM 5 MG PO TABS: 5.0000 mg | ORAL_TABLET | Freq: Every day | ORAL | Status: DC

## 2015-12-10 MED ORDER — METHIMAZOLE 5 MG PO TABS
15.0000 mg | ORAL_TABLET | Freq: Every evening | ORAL | Status: DC
  Administered 2015-12-10 – 2015-12-15 (×5): 15 mg via ORAL
  Filled 2015-12-10 (×6): qty 1

## 2015-12-10 MED ORDER — DEXTROSE 5 % IV SOLN
500.0000 mg | Freq: Four times a day (QID) | INTRAVENOUS | Status: DC | PRN
  Filled 2015-12-10: qty 5

## 2015-12-10 MED ORDER — SUGAMMADEX SODIUM 200 MG/2ML IV SOLN
INTRAVENOUS | Status: AC
  Filled 2015-12-10: qty 2

## 2015-12-10 MED ORDER — ONDANSETRON HCL 4 MG/2ML IJ SOLN
4.0000 mg | Freq: Once | INTRAMUSCULAR | Status: AC
  Administered 2015-12-10: 4 mg via INTRAVENOUS
  Filled 2015-12-10: qty 2

## 2015-12-10 MED ORDER — PROPOFOL 10 MG/ML IV BOLUS
INTRAVENOUS | Status: AC
  Filled 2015-12-10: qty 20

## 2015-12-10 MED ORDER — CARVEDILOL 12.5 MG PO TABS
6.2500 mg | ORAL_TABLET | ORAL | Status: AC
  Administered 2015-12-10: 6.25 mg via ORAL
  Filled 2015-12-10: qty 1

## 2015-12-10 MED ORDER — METOCLOPRAMIDE HCL 5 MG PO TABS: 5.0000 mg | ORAL_TABLET | Freq: Three times a day (TID) | ORAL | Status: DC | PRN

## 2015-12-10 MED ORDER — ATORVASTATIN CALCIUM 40 MG PO TABS
40.0000 mg | ORAL_TABLET | Freq: Every day | ORAL | Status: DC
  Administered 2015-12-10 – 2015-12-14 (×5): 40 mg via ORAL
  Filled 2015-12-10 (×5): qty 1

## 2015-12-10 MED ORDER — ONDANSETRON HCL 4 MG/2ML IJ SOLN
INTRAMUSCULAR | Status: AC
  Filled 2015-12-10: qty 2

## 2015-12-10 MED ORDER — HYDROMORPHONE HCL 1 MG/ML IJ SOLN
INTRAMUSCULAR | Status: AC
  Administered 2015-12-10: 0.5 mg via INTRAVENOUS
  Filled 2015-12-10: qty 1

## 2015-12-10 MED ORDER — PROMETHAZINE HCL 25 MG/ML IJ SOLN: 6.2500 mg | INTRAMUSCULAR | Status: DC | PRN

## 2015-12-10 MED ORDER — PROPOFOL 10 MG/ML IV BOLUS
INTRAVENOUS | Status: DC | PRN
  Administered 2015-12-10: 120 mg via INTRAVENOUS

## 2015-12-10 MED ORDER — CARVEDILOL 12.5 MG PO TABS: 6.2500 mg | ORAL_TABLET | Freq: Two times a day (BID) | ORAL | Status: DC

## 2015-12-10 MED ORDER — HYDROMORPHONE HCL 1 MG/ML IJ SOLN
0.2500 mg | INTRAMUSCULAR | Status: DC | PRN
  Administered 2015-12-10 (×2): 0.5 mg via INTRAVENOUS

## 2015-12-10 MED ORDER — ROCURONIUM BROMIDE 100 MG/10ML IV SOLN
INTRAVENOUS | Status: DC | PRN
  Administered 2015-12-10: 20 mg via INTRAVENOUS
  Administered 2015-12-10: 50 mg via INTRAVENOUS

## 2015-12-10 MED ORDER — HYDROCODONE-ACETAMINOPHEN 10-325 MG PO TABS: 1.0000 | ORAL_TABLET | Freq: Four times a day (QID) | ORAL | 0 refills | Status: DC | PRN

## 2015-12-10 MED ORDER — ENOXAPARIN SODIUM 40 MG/0.4ML ~~LOC~~ SOLN
40.0000 mg | SUBCUTANEOUS | Status: DC
  Administered 2015-12-11 – 2015-12-14 (×4): 40 mg via SUBCUTANEOUS
  Filled 2015-12-10 (×4): qty 0.4

## 2015-12-10 MED ORDER — OXYCODONE HCL 5 MG PO TABS
5.0000 mg | ORAL_TABLET | ORAL | Status: DC | PRN
  Administered 2015-12-11 (×3): 10 mg via ORAL
  Filled 2015-12-10: qty 2

## 2015-12-10 MED ORDER — WARFARIN SODIUM 7.5 MG PO TABS: 7.5000 mg | ORAL_TABLET | Freq: Once | ORAL | Status: DC

## 2015-12-10 MED ORDER — MORPHINE SULFATE (PF) 2 MG/ML IV SOLN: 0.5000 mg | INTRAVENOUS | Status: DC | PRN

## 2015-12-10 MED ORDER — CEFAZOLIN SODIUM-DEXTROSE 2-3 GM-% IV SOLR
INTRAVENOUS | Status: DC | PRN
  Administered 2015-12-10: 2 g via INTRAVENOUS

## 2015-12-10 MED ORDER — ACETAMINOPHEN 325 MG PO TABS
650.0000 mg | ORAL_TABLET | Freq: Four times a day (QID) | ORAL | Status: DC | PRN
  Administered 2015-12-12 – 2015-12-14 (×5): 650 mg via ORAL
  Filled 2015-12-10 (×5): qty 2

## 2015-12-10 MED ORDER — HYDROMORPHONE HCL 1 MG/ML IJ SOLN
0.5000 mg | Freq: Once | INTRAMUSCULAR | Status: AC
Start: 2015-12-10 — End: 2015-12-10
  Administered 2015-12-10: 0.5 mg via INTRAVENOUS
  Filled 2015-12-10: qty 1

## 2015-12-10 MED ORDER — HYDROCODONE-ACETAMINOPHEN 5-325 MG PO TABS
1.0000 | ORAL_TABLET | Freq: Four times a day (QID) | ORAL | Status: DC | PRN
Start: 2015-12-10 — End: 2015-12-11
  Administered 2015-12-10 – 2015-12-11 (×4): 2 via ORAL
  Filled 2015-12-10 (×5): qty 2

## 2015-12-10 MED ORDER — CARVEDILOL 25 MG PO TABS
25.0000 mg | ORAL_TABLET | Freq: Two times a day (BID) | ORAL | Status: DC
  Administered 2015-12-11 – 2015-12-15 (×10): 25 mg via ORAL
  Filled 2015-12-10 (×10): qty 1

## 2015-12-10 MED ORDER — SUGAMMADEX SODIUM 200 MG/2ML IV SOLN
INTRAVENOUS | Status: DC | PRN
Start: 2015-12-10 — End: 2015-12-10
  Administered 2015-12-10: 194.2 mg via INTRAVENOUS

## 2015-12-10 MED ORDER — PHENYLEPHRINE HCL 10 MG/ML IJ SOLN
INTRAVENOUS | Status: DC | PRN
  Administered 2015-12-10: 20 ug/min via INTRAVENOUS

## 2015-12-10 MED ORDER — FENTANYL CITRATE (PF) 100 MCG/2ML IJ SOLN
INTRAMUSCULAR | Status: AC
  Filled 2015-12-10: qty 4

## 2015-12-10 MED ORDER — MOMETASONE FURO-FORMOTEROL FUM 200-5 MCG/ACT IN AERO
2.0000 | INHALATION_SPRAY | Freq: Two times a day (BID) | RESPIRATORY_TRACT | Status: DC
Start: 2015-12-10 — End: 2015-12-15
  Administered 2015-12-10 – 2015-12-15 (×10): 2 via RESPIRATORY_TRACT
  Filled 2015-12-10: qty 8.8

## 2015-12-10 MED ORDER — HYDROMORPHONE HCL 1 MG/ML IJ SOLN
0.5000 mg | INTRAMUSCULAR | Status: AC | PRN
  Administered 2015-12-10 (×2): 0.5 mg via INTRAVENOUS
  Filled 2015-12-10 (×2): qty 1

## 2015-12-10 MED ORDER — METOCLOPRAMIDE HCL 5 MG/ML IJ SOLN
5.0000 mg | Freq: Three times a day (TID) | INTRAMUSCULAR | Status: DC | PRN
  Administered 2015-12-14: 10 mg via INTRAVENOUS
  Filled 2015-12-10: qty 2

## 2015-12-10 SURGICAL SUPPLY — 43 items
BLADE SURG 15 STRL LF DISP TIS (BLADE) ×1 IMPLANT
BLADE SURG 15 STRL SS (BLADE) ×2
BNDG COHESIVE 4X5 TAN NS LF (GAUZE/BANDAGES/DRESSINGS) ×3 IMPLANT
BNDG COHESIVE 6X5 TAN STRL LF (GAUZE/BANDAGES/DRESSINGS) IMPLANT
BNDG GAUZE ELAST 4 BULKY (GAUZE/BANDAGES/DRESSINGS) ×3 IMPLANT
COVER PERINEAL POST (MISCELLANEOUS) ×3 IMPLANT
COVER SURGICAL LIGHT HANDLE (MISCELLANEOUS) ×3 IMPLANT
DRAPE PROXIMA HALF (DRAPES) IMPLANT
DRAPE STERI IOBAN 125X83 (DRAPES) ×3 IMPLANT
DRSG MEPILEX BORDER 4X4 (GAUZE/BANDAGES/DRESSINGS) ×3 IMPLANT
DRSG MEPILEX BORDER 4X8 (GAUZE/BANDAGES/DRESSINGS) ×3 IMPLANT
DRSG PAD ABDOMINAL 8X10 ST (GAUZE/BANDAGES/DRESSINGS) ×6 IMPLANT
DURAPREP 26ML APPLICATOR (WOUND CARE) ×3 IMPLANT
ELECT CAUTERY BLADE 6.4 (BLADE) ×3 IMPLANT
ELECT REM PT RETURN 9FT ADLT (ELECTROSURGICAL) ×3
ELECTRODE REM PT RTRN 9FT ADLT (ELECTROSURGICAL) ×1 IMPLANT
FACESHIELD WRAPAROUND (MASK) ×3 IMPLANT
GAUZE XEROFORM 5X9 LF (GAUZE/BANDAGES/DRESSINGS) ×3 IMPLANT
GLOVE SKINSENSE NS SZ7.5 (GLOVE) ×4
GLOVE SKINSENSE STRL SZ7.5 (GLOVE) ×2 IMPLANT
GOWN STRL REIN XL XLG (GOWN DISPOSABLE) ×3 IMPLANT
GUIDE PIN 3.2X343 (PIN) ×1
GUIDE PIN 3.2X343MM (PIN) ×2
KIT BASIN OR (CUSTOM PROCEDURE TRAY) ×3 IMPLANT
KIT ROOM TURNOVER OR (KITS) ×3 IMPLANT
LINER BOOT UNIVERSAL DISP (MISCELLANEOUS) ×3 IMPLANT
MANIFOLD NEPTUNE II (INSTRUMENTS) ×3 IMPLANT
NAIL TRIGEN LEFT 11.5X38-125 (Nail) ×3 IMPLANT
NS IRRIG 1000ML POUR BTL (IV SOLUTION) ×3 IMPLANT
PACK GENERAL/GYN (CUSTOM PROCEDURE TRAY) ×3 IMPLANT
PAD ARMBOARD 7.5X6 YLW CONV (MISCELLANEOUS) ×6 IMPLANT
PAD CAST 4YDX4 CTTN HI CHSV (CAST SUPPLIES) ×2 IMPLANT
PADDING CAST COTTON 4X4 STRL (CAST SUPPLIES) ×4
PIN GUIDE 3.2X343MM (PIN) ×1 IMPLANT
SCREW LAG COMPR KIT 95/90 (Screw) ×3 IMPLANT
STAPLER VISISTAT 35W (STAPLE) ×3 IMPLANT
SUT VIC AB 0 CT1 27 (SUTURE) ×4
SUT VIC AB 0 CT1 27XBRD ANBCTR (SUTURE) ×2 IMPLANT
SUT VIC AB 2-0 CT1 27 (SUTURE) ×4
SUT VIC AB 2-0 CT1 TAPERPNT 27 (SUTURE) ×2 IMPLANT
TOWEL OR 17X24 6PK STRL BLUE (TOWEL DISPOSABLE) ×3 IMPLANT
TOWEL OR 17X26 10 PK STRL BLUE (TOWEL DISPOSABLE) ×3 IMPLANT
WATER STERILE IRR 1000ML POUR (IV SOLUTION) ×3 IMPLANT

## 2015-12-10 NOTE — Consult Note (Signed)
ORTHOPAEDIC CONSULTATION  REQUESTING PHYSICIAN: Michel Bickers, MD  Chief Complaint: Left hip fracture  HPI: Danielle [name] is a 69 y.o. female who presents with left hip fracture s/p mechanical fall PTA.  The patient endorses severe pain in the left hip, that does not radiate, grinding in quality, worse with any movement, better with immobilization.  Denies LOC/fever/chills/nausea/vomiting.  Walks with assistive devices (walker, cane, wheelchair).  Does live independently with husband.  Was recently discharged from hospital for VATS procedure for pulmonary blebs.  Denies LOC, neck pain, abd pain.  Past Medical History:  Diagnosis Date  . Aortic stenosis    Status post bioprosthetic AVR  . Arthritis    DJD  . Asthma   . Chronic systolic CHF (congestive heart failure) (HCC)    EF 30% 04/2014  . Coronary artery disease   . Gastritis   . Hyperlipidemia   . Hypertension   . Hyperthyroidism   . Multiple thyroid nodules   . Pelvis fracture ([place]) 08/19/2015   MULTIPLE   . Spontaneous pneumothorax 11/28/2015   left   . Stroke [facility])    Past Surgical History:  Procedure Laterality Date  . CHEST TUBE INSERTION  10/2015  . CORONARY ARTERY BYPASS GRAFT  04/2014  . PLEURADESIS Left 12/03/2015   Procedure: PLEURADESIS;  Surgeon: Melrose Nakayama, MD;  Location: [place];  Service: Thoracic;  Laterality: Left;  . RESECTION OF APICAL BLEB Left 12/03/2015   Procedure: BLEBECTOMY;  Surgeon: Melrose Nakayama, MD;  Location: [place];  Service: Thoracic;  Laterality: Left;  . THORACOSCOPY  12/03/2015  . VALVE REPLACEMENT    . VIDEO ASSISTED THORACOSCOPY Left 12/03/2015   Procedure: VIDEO ASSISTED THORACOSCOPY;  Surgeon: Melrose Nakayama, MD;  Location: [place];  Service: Thoracic;  Laterality: Left;   Social History   Social History  . Marital status: Married    Spouse name: Sherwood  . Number of children: 0  . Years of education: N/A   Occupational History  . Retired  in 2004    Social History Main Topics  . Smoking status: Former Smoker    Quit date: 03/01/2010  . Smokeless tobacco: Never Used  . Alcohol use No  . Drug use: No  . Sexual activity: Not Asked   Other Topics Concern  . None   Social History Narrative   Lives with husband.  Ambulated independently.   Family History  Problem Relation Age of Onset  . Diabetes Mother   . Heart attack Mother   . Diabetes Father   . Lung cancer Father   . Diabetes Sister   . Diabetes Sister   . HIV Brother    Allergies  Allergen Reactions  . Tetanus Toxoid Adsorbed Swelling    Unknown    Prior to Admission medications   Medication Sig Start Date End Date Taking? Authorizing Provider  albuterol (PROAIR HFA) 108 (90 Base) MCG/ACT inhaler Inhale 2 puffs into the lungs every 6 (six) hours as needed for wheezing or shortness of breath.  12/11/14  Yes Historical Provider, MD  aspirin EC 81 MG tablet Take 81 mg by mouth daily. 12/10/14  Yes Historical Provider, MD  atorvastatin (LIPITOR) 40 MG tablet Take 40 mg by mouth at bedtime. 02/21/15  Yes Historical Provider, MD  budesonide-formoterol (SYMBICORT) 160-4.5 MCG/ACT inhaler Inhale 2 puffs into the lungs 2 (two) times daily. 03/27/15  Yes Historical Provider, MD  carvedilol (COREG) 6.25 MG tablet Take 1 tablet (6.25 mg total) by mouth 2 (two)  times daily with a meal. 12/09/15  Yes Wayne E Gold, PA-C  methimazole (TAPAZOLE) 5 MG tablet Take 15 mg by mouth every evening. 12/17/14  Yes Historical Provider, MD  oxyCODONE (OXY IR/ROXICODONE) 5 MG immediate release tablet Take 1-2 tablets (5-10 mg total) by mouth every 6 (six) hours as needed for severe pain. 12/09/15  Yes Wayne E Gold, PA-C  ranitidine (ZANTAC) 150 MG tablet Take 150 mg by mouth 2 (two) times daily. 12/10/14  Yes Historical Provider, MD  senna-docusate (SENOKOT-S) 8.6-50 MG tablet Take 1 tablet by mouth 2 (two) times daily.    Yes Historical Provider, MD  warfarin (COUMADIN) 5 MG tablet Take 1  tablet (5 mg total) by mouth daily at 6 PM. 12/09/15  Yes John Giovanni, PA-C   Dg Chest 1 View  Result Date: 12/10/2015 CLINICAL DATA:  Pain following fall EXAM: CHEST 1 VIEW COMPARISON:  December 09, 2015 FINDINGS: There has been resolution of fluid from the recent apical hydropneumothorax on the left. Small residual pneumothorax remains in the left apex. There is postoperative change in both upper lobe regions. Scattered areas of scarring remain. There is a small left pleural effusion, persistent. Interstitium remains prominent but stable. There is no frank edema or consolidation. There is cardiomegaly with mild pulmonary venous hypertension, stable. Subcutaneous emphysema remains on the left. Central catheter is no longer apparent. IMPRESSION: Central catheter no longer apparent. The previously noted fluid in a left apical hydro pneumothorax has resolved. Small residual left apical pneumothorax is felt to be present. No tension component. Small left pleural effusion remains. There is pulmonary venous congestion without frank edema or consolidation. Somewhat nodular interstitial prominence remains. Stable cardiac silhouette. Stable postoperative change in both upper lobes. Electronically Signed   By: Lowella Grip III M.D.   On: 12/10/2015 08:47   Dg Chest 2 View  Result Date: 12/09/2015 CLINICAL DATA:  History pneumothorax. Persistent shortness of breath. EXAM: CHEST  2 VIEW COMPARISON:  10/9/ 2017; 12/07/2015 ; 12/05/2015; 11/30/2015; 11/28/2015; chest CT - 12/01/2015 FINDINGS: Grossly unchanged cardiac silhouette and mediastinal contours post median sternotomy and valve replacement. Stable positioning of support apparatus. Grossly unchanged loculated left apical hydro pneumothorax with associated small layering left-sided pleural effusion. Stable postsurgical change of the right mid and upper lung. Grossly unchanged diffuse slightly nodular thickening of the pulmonary interstitium. Pulmonary venous  congestion without frank evidence of edema. Unchanged bones. The amount of left lateral chest wall subcutaneous emphysema is grossly unchanged. IMPRESSION: 1. Grossly unchanged loculated left apical hydro pneumothorax without interval change. 2. Grossly unchanged small layering left-sided effusion. Electronically Signed   By: Sandi Mariscal M.D.   On: 12/09/2015 09:06   Dg Hip Unilat W Or Wo Pelvis 2-3 Views Left  Result Date: 12/10/2015 CLINICAL DATA:  Pain following fall EXAM: DG HIP (WITH OR WITHOUT PELVIS) 2-3V LEFT COMPARISON:  August 19, 2015 FINDINGS: Frontal pelvis as well as frontal and lateral left hip images were obtained. There is a comminuted fracture of the intertrochanteric femur region with varus angulation at the fracture site. Evidence of prior subtle fracture in mid left acetabulum, nondisplaced. No other fractures are evident. No dislocation. There is mild symmetric narrowing of both hip joints. There is degenerative change in the lower lumbar spine. There is calcification in the distal aorta and proximal common iliac arteries. IMPRESSION: Comminuted intertrochanteric femur fracture on the left with varus angulation at the fracture site. Prior nondisplaced fracture mid acetabulum, stable. Symmetric narrowing both hip joints. No dislocation.  Aortoiliac atherosclerosis noted. Electronically Signed   By: Lowella Grip III M.D.   On: 12/10/2015 08:49    All pertinent xrays, MRI, CT independently reviewed and interpreted  Positive ROS: All other systems have been reviewed and were otherwise negative with the exception of those mentioned in the HPI and as above.  Physical Exam: General: Alert, no acute distress Cardiovascular: No pedal edema Respiratory: No cyanosis, no use of accessory musculature GI: No organomegaly, abdomen is soft and non-tender Skin: No lesions in the area of chief complaint Neurologic: Sensation intact distally Psychiatric: Patient is competent for consent with  normal mood and affect Lymphatic: No axillary or cervical lymphadenopathy  MUSCULOSKELETAL:  - pain with movement of the hip and extremity - skin intact - NVI distally - compartments soft  Assessment: Left intertroch hip fracture  Plan: - surgery is recommended, patient and family are aware of r/b/a and wish to proceed - consent obtained - medical optimization per primary team - surgery is planned for today - Based on history and fracture pattern this likely represents a fragility fracture. - Fragility fractures affect up to one half of women and one third of men after age 64 years and occur in the setting of bone disorder such as osteoporosis or osteopenia and warrant appropriate work-up. - The following are general recommendations that may serve as an outline for an appropriate work-up:  1.) Obtain bone density measurement to confirm presumptive diagnosis, assess severity of osteoporosis and risk of future fracture, and use as baseline for monitoring treatment  2.) Obtain laboratory tests: CBC, ESR, serum calcium, creatinine, albumin,phosphate, alkaline phosphatase, liver transaminases, protein electrophoresis, urinalysis, 25-hydroxyvitamin D.  3.) Exclude secondary causes of low bone mass and skeletal fragility (eg,multiple myeloma, lymphoma) as indicated.  4.) Obtain radiograph of thoracic and lumbar spine, particularly among individuals with back pain or height loss to assess presence of vertebral fractures  5.) Intermittent administration of recombinant human parathyroid hormone  6.) Optimize nutritional status using nutritional supplementation.  7.) Patient/family education to prevent future falls.  8.) Early mobilization and exercise program - exercise decreases the rate of bone loss and has been associated with decreased rate of fragility fractures   Thank you for the consult and the opportunity to see Danielle [name]  N. Eduard Roux, MD [place] 1:28 PM

## 2015-12-10 NOTE — ED Notes (Signed)
Ortho at bedside.

## 2015-12-10 NOTE — ED Notes (Signed)
MD at bedside.  hospitalist 

## 2015-12-10 NOTE — Op Note (Signed)
   Date of Surgery: 12/10/2015  INDICATIONS: Danielle [name] is a 70 y.o.-year-old female who sustained a left hip fracture. The risks and benefits of the procedure discussed with the patient prior to the procedure and all questions were answered; consent was obtained.  PREOPERATIVE DIAGNOSIS: left hip fracture   POSTOPERATIVE DIAGNOSIS: Same   PROCEDURE: Treatment of intertrochanteric fracture with intramedullary implant. CPT [phone]   SURGEON: N. Eduard Roux, M.D.   ANESTHESIA: general   IV FLUIDS AND URINE: See anesthesia record   ESTIMATED BLOOD LOSS: 300 cc  IMPLANTS: [employer] InterTAN 11.5 x 38, 95/90  DRAINS: None.   COMPLICATIONS: None.   DESCRIPTION OF PROCEDURE: The patient was brought to the operating room and placed supine on the operating table. The patient's leg had been signed prior to the procedure. The patient had the anesthesia placed by the anesthesiologist. The prep verification and incision time-outs were performed to confirm that this was the correct patient, site, side and location. The patient had an SCD on the opposite lower extremity. The patient did receive antibiotics prior to the incision and was re-dosed during the procedure as needed at indicated intervals. The patient was positioned on the fracture table with the table in traction and internal rotation to reduce the hip. The well leg was placed in a scissor position and all bony prominences were well-padded. The patient had the lower extremity prepped and draped in the standard surgical fashion. The incision was made 4 finger breadths superior to the greater trochanter. A guide pin was inserted into the tip of the greater trochanter under fluoroscopic guidance. An opening reamer was used to gain access to the femoral canal. The nail length was measured and inserted down the femoral canal to its proper depth. The appropriate version of insertion for the lag screw was found under fluoroscopy. A pin  was inserted up the femoral neck through the jig. Then, a second antirotation pin was inserted inferior to the first pin. The length of the lag screw was then measured. The lag screw was inserted as near to center-center in the head as possible. The antirotation pin was then taken out and an interdigitating compression screw was placed in its place. The leg was taken out of traction, then the interdigitating compression screw was used to compress across the fracture. Compression was visualized on serial xrays. The wound was copiously irrigated with saline and the subcutaneous layer closed with 2.0 vicryl and the skin was reapproximated with staples. The wounds were cleaned and dried a final time and a sterile dressing was placed. The hip was taken through a range of motion at the end of the case under fluoroscopic imaging to visualize the approach-withdraw phenomenon and confirm implant length in the head. The patient was then awakened from anesthesia and taken to the recovery room in stable condition. All counts were correct at the end of the case.   POSTOPERATIVE PLAN: The patient will be weight bearing as tolerated and will return in 2 weeks for staple removal and the patient will receive DVT prophylaxis based on other medications, activity level, and risk ratio of bleeding to thrombosis.   Danielle Cecil, MD [place] 4:07 PM

## 2015-12-10 NOTE — ED Provider Notes (Signed)
[facility] DEPT Provider Note   CSN: [id number] Arrival date & time: 12/10/15  [id number]     History   Chief Complaint Chief Complaint  Patient presents with  . Fall    Hip Injury    HPI Danielle [name] is a 70 y.o. female.  She presents for evaluation of left hip pain after a fall in her home this morning.  She was walking from her bedroom when she tripped and fell. She is adamant that she did not syncope or have symptoms at that was caused a trip. She landed on her left hip and felt severe pain. She is unable to move or ambulate. 911 was contacted. She was transferred by EMS. Significant recent medical history of a pneumothorax requiring video thoracoscopy for bleb. Had chest tube removal and discharged several days ago. Has not had shortness of breath. Was given fentanyl en route.   HPI  Past Medical History:  Diagnosis Date  . Aortic stenosis    Status post bioprosthetic AVR  . Arthritis    DJD  . Asthma   . Chronic systolic CHF (congestive heart failure) (HCC)    EF 30% 04/2014  . Coronary artery disease   . Gastritis   . Hyperlipidemia   . Hypertension   . Hyperthyroidism   . Multiple thyroid nodules   . Pelvis fracture ([place]) 08/19/2015   MULTIPLE   . Spontaneous pneumothorax 11/28/2015   left   . Stroke [facility])     Patient Active Problem List   Diagnosis Date Noted  . Hip fracture ([place]) 12/10/2015  . Aortic atherosclerosis ([place]) 12/10/2015  . Lung blebs ([place]) 12/03/2015  . Pelvic fracture ([employer]) 08/19/2015  . Fall at home 08/19/2015  . Hypertension 08/19/2015  . Hyperlipidemia 08/19/2015  . Asthma 08/19/2015  . Atrial fibrillation ([place]) 08/19/2015  . Hyperthyroidism 08/19/2015  . Chronic systolic CHF (congestive heart failure) ([place])   . 3-vessel CAD   . Aortic stenosis     Past Surgical History:  Procedure Laterality Date  . CHEST TUBE INSERTION  10/2015  . CORONARY ARTERY BYPASS GRAFT  04/2014  . PLEURADESIS Left 12/03/2015   Procedure:  PLEURADESIS;  Surgeon: Melrose Nakayama, MD;  Location: [facility];  Service: Thoracic;  Laterality: Left;  . RESECTION OF APICAL BLEB Left 12/03/2015   Procedure: BLEBECTOMY;  Surgeon: Melrose Nakayama, MD;  Location: [facility];  Service: Thoracic;  Laterality: Left;  . THORACOSCOPY  12/03/2015  . VALVE REPLACEMENT    . VIDEO ASSISTED THORACOSCOPY Left 12/03/2015   Procedure: VIDEO ASSISTED THORACOSCOPY;  Surgeon: Melrose Nakayama, MD;  Location: [facility];  Service: Thoracic;  Laterality: Left;    OB History    No data available       Home Medications    Prior to Admission medications   Medication Sig Start Date End Date Taking? Authorizing Provider  albuterol (PROAIR HFA) 108 (90 Base) MCG/ACT inhaler Inhale 2 puffs into the lungs every 6 (six) hours as needed for wheezing or shortness of breath.  12/11/14  Yes Historical Provider, MD  aspirin EC 81 MG tablet Take 81 mg by mouth daily. 12/10/14  Yes Historical Provider, MD  atorvastatin (LIPITOR) 40 MG tablet Take 40 mg by mouth at bedtime. 02/21/15  Yes Historical Provider, MD  budesonide-formoterol (SYMBICORT) 160-4.5 MCG/ACT inhaler Inhale 2 puffs into the lungs 2 (two) times daily. 03/27/15  Yes Historical Provider, MD  carvedilol (COREG) 6.25 MG tablet Take 1 tablet (6.25 mg total) by mouth 2 (two)  times daily with a meal. 12/09/15  Yes Wayne E Gold, PA-C  methimazole (TAPAZOLE) 5 MG tablet Take 15 mg by mouth every evening. 12/17/14  Yes Historical Provider, MD  oxyCODONE (OXY IR/ROXICODONE) 5 MG immediate release tablet Take 1-2 tablets (5-10 mg total) by mouth every 6 (six) hours as needed for severe pain. 12/09/15  Yes Wayne E Gold, PA-C  ranitidine (ZANTAC) 150 MG tablet Take 150 mg by mouth 2 (two) times daily. 12/10/14  Yes Historical Provider, MD  senna-docusate (SENOKOT-S) 8.6-50 MG tablet Take 1 tablet by mouth 2 (two) times daily.    Yes Historical Provider, MD  HYDROcodone-acetaminophen (NORCO) 10-325 MG tablet Take 1-2  tablets by mouth every 6 (six) hours as needed for moderate pain or severe pain. 12/10/15   Leandrew Koyanagi, MD  warfarin (COUMADIN) 5 MG tablet Take 1 tablet (5 mg total) by mouth daily at 6 PM. 12/10/15   Naiping Ephriam Jenkins, MD    Family History Family History  Problem Relation Age of Onset  . Diabetes Mother   . Heart attack Mother   . Diabetes Father   . Lung cancer Father   . Diabetes Sister   . Diabetes Sister   . HIV Brother     Social History Social History  Substance Use Topics  . Smoking status: Former Smoker    Quit date: 03/01/2010  . Smokeless tobacco: Never Used  . Alcohol use No     Allergies   Tetanus toxoid adsorbed   Review of Systems Review of Systems  Constitutional: Negative for appetite change, chills, diaphoresis, fatigue and fever.  HENT: Negative for mouth sores, sore throat and trouble swallowing.   Eyes: Negative for visual disturbance.  Respiratory: Negative for cough, chest tightness, shortness of breath and wheezing.   Cardiovascular: Negative for chest pain.  Gastrointestinal: Negative for abdominal distention, abdominal pain, diarrhea, nausea and vomiting.  Endocrine: Negative for polydipsia, polyphagia and polyuria.  Genitourinary: Negative for dysuria, frequency and hematuria.  Musculoskeletal: Positive for arthralgias. Negative for gait problem.       Severe left hip pain  Skin: Negative for color change, pallor and rash.  Neurological: Negative for dizziness, syncope, light-headedness and headaches.  Hematological: Does not bruise/bleed easily.  Psychiatric/Behavioral: Negative for behavioral problems and confusion.     Physical Exam Updated Vital Signs BP 134/81   Pulse 111   Temp 99.3 F (37.4 C) (Oral)   Resp (!) 29   Ht 5\' 7"  (1.702 m)   Wt 214 lb (97.1 kg)   SpO2 97%   BMI 33.52 kg/m   Physical Exam  Constitutional: She is oriented to person, place, and time. She appears well-developed and well-nourished. She appears  distressed.  Distress secondary to pain. Awake alert and oriented. No apparent dyspnea.  HENT:  Head: Normocephalic.  Eyes: Conjunctivae are normal. Pupils are equal, round, and reactive to light. No scleral icterus.  Neck: Normal range of motion. Neck supple. No thyromegaly present.  Cardiovascular: Normal rate and regular rhythm.  Exam reveals no gallop and no friction rub.   No murmur heard. Pulmonary/Chest: Effort normal and breath sounds normal. No respiratory distress. She has no wheezes. She has no rales.  Nontender over the chest wall. Covered dressings in the left second intercostal space, and in left lateral intercostal space at the axilla. Dressings and suture sites appear intact. Clear bilateral breath sounds. No crepitus. No subcutaneous cutaneous air.  Abdominal: Soft. Bowel sounds are normal. She exhibits no distension. There  is no tenderness. There is no rebound.  Musculoskeletal: Normal range of motion.  Left lower extremity is shortened and extra only rotated. She points to her greater trochanter as her area of pain.  Neurological: She is alert and oriented to person, place, and time.  Skin: Skin is warm and dry. No rash noted.  Psychiatric: She has a normal mood and affect. Her behavior is normal.     ED Treatments / Results  Labs (all labs ordered are listed, but only abnormal results are displayed) Labs Reviewed  CBC WITH DIFFERENTIAL/PLATELET - Abnormal; Notable for the following:       Result Value   WBC 10.8 (*)    RBC 3.83 (*)    Hemoglobin 11.9 (*)    HCT 35.4 (*)    Platelets 131 (*)    Neutro Abs 9.1 (*)    All other components within normal limits  COMPREHENSIVE METABOLIC PANEL - Abnormal; Notable for the following:    Glucose, Bld 117 (*)    Total Protein 6.1 (*)    Albumin 2.9 (*)    GFR calc non Af Amer 58 (*)    All other components within normal limits  SEDIMENTATION RATE - Abnormal; Notable for the following:    Sed Rate 54 (*)    All other  components within normal limits  PROTIME-INR  TYPE AND SCREEN    EKG  EKG Interpretation None       Radiology Dg Chest 1 View  Result Date: 12/10/2015 CLINICAL DATA:  Pain following fall EXAM: CHEST 1 VIEW COMPARISON:  December 09, 2015 FINDINGS: There has been resolution of fluid from the recent apical hydropneumothorax on the left. Small residual pneumothorax remains in the left apex. There is postoperative change in both upper lobe regions. Scattered areas of scarring remain. There is a small left pleural effusion, persistent. Interstitium remains prominent but stable. There is no frank edema or consolidation. There is cardiomegaly with mild pulmonary venous hypertension, stable. Subcutaneous emphysema remains on the left. Central catheter is no longer apparent. IMPRESSION: Central catheter no longer apparent. The previously noted fluid in a left apical hydro pneumothorax has resolved. Small residual left apical pneumothorax is felt to be present. No tension component. Small left pleural effusion remains. There is pulmonary venous congestion without frank edema or consolidation. Somewhat nodular interstitial prominence remains. Stable cardiac silhouette. Stable postoperative change in both upper lobes. Electronically Signed   By: Lowella Grip III M.D.   On: 12/10/2015 08:47   Dg Chest 2 View  Result Date: 12/09/2015 CLINICAL DATA:  History pneumothorax. Persistent shortness of breath. EXAM: CHEST  2 VIEW COMPARISON:  10/9/ 2017; 12/07/2015 ; 12/05/2015; 11/30/2015; 11/28/2015; chest CT - 12/01/2015 FINDINGS: Grossly unchanged cardiac silhouette and mediastinal contours post median sternotomy and valve replacement. Stable positioning of support apparatus. Grossly unchanged loculated left apical hydro pneumothorax with associated small layering left-sided pleural effusion. Stable postsurgical change of the right mid and upper lung. Grossly unchanged diffuse slightly nodular thickening of  the pulmonary interstitium. Pulmonary venous congestion without frank evidence of edema. Unchanged bones. The amount of left lateral chest wall subcutaneous emphysema is grossly unchanged. IMPRESSION: 1. Grossly unchanged loculated left apical hydro pneumothorax without interval change. 2. Grossly unchanged small layering left-sided effusion. Electronically Signed   By: Sandi Mariscal M.D.   On: 12/09/2015 09:06   Dg C-arm 1-60 Min  Result Date: 12/10/2015 CLINICAL DATA:  ORIF left femur fracture EXAM: DG C-ARM 61-120 MIN; LEFT FEMUR 2 VIEWS  COMPARISON:  None FLUOROSCOPY TIME:  1 minutes 57 seconds FINDINGS: Intertrochanteric fracture transfixed with an intramedullary nail and 2 cannulated femoral neck screws. Fracture is in near anatomic alignment. IMPRESSION: ORIF left intertrochanteric fracture. Electronically Signed   By: Kathreen Devoid   On: 12/10/2015 16:12   Dg Hip Unilat W Or Wo Pelvis 2-3 Views Left  Result Date: 12/10/2015 CLINICAL DATA:  Pain following fall EXAM: DG HIP (WITH OR WITHOUT PELVIS) 2-3V LEFT COMPARISON:  August 19, 2015 FINDINGS: Frontal pelvis as well as frontal and lateral left hip images were obtained. There is a comminuted fracture of the intertrochanteric femur region with varus angulation at the fracture site. Evidence of prior subtle fracture in mid left acetabulum, nondisplaced. No other fractures are evident. No dislocation. There is mild symmetric narrowing of both hip joints. There is degenerative change in the lower lumbar spine. There is calcification in the distal aorta and proximal common iliac arteries. IMPRESSION: Comminuted intertrochanteric femur fracture on the left with varus angulation at the fracture site. Prior nondisplaced fracture mid acetabulum, stable. Symmetric narrowing both hip joints. No dislocation. Aortoiliac atherosclerosis noted. Electronically Signed   By: Lowella Grip III M.D.   On: 12/10/2015 08:49   Dg Femur Min 2 Views Left  Result Date:  12/10/2015 CLINICAL DATA:  ORIF left femur fracture EXAM: DG C-ARM 61-120 MIN; LEFT FEMUR 2 VIEWS COMPARISON:  None FLUOROSCOPY TIME:  1 minutes 57 seconds FINDINGS: Intertrochanteric fracture transfixed with an intramedullary nail and 2 cannulated femoral neck screws. Fracture is in near anatomic alignment. IMPRESSION: ORIF left intertrochanteric fracture. Electronically Signed   By: Kathreen Devoid   On: 12/10/2015 16:12    Procedures Procedures (including critical care time)  Medications Ordered in ED Medications  morphine 4 MG/ML injection 4 mg ( Intravenous MAR Hold 12/10/15 1443)  albuterol (PROVENTIL) (2.5 MG/3ML) 0.083% nebulizer solution 3 mL ( Inhalation MAR Hold 12/10/15 1443)  atorvastatin (LIPITOR) tablet 40 mg ( Oral Automatically Held 12/18/15 2200)  mometasone-formoterol (DULERA) 200-5 MCG/ACT inhaler 2 puff ( Inhalation Automatically Held 12/18/15 2000)  methimazole (TAPAZOLE) tablet 15 mg ( Oral Automatically Held 12/18/15 1800)  lactated ringers infusion ( Intravenous New Bag/Given 12/10/15 1448)  0.9 % irrigation (POUR BTL) (1,000 mLs Irrigation Given 12/10/15 1457)  carvedilol (COREG) tablet 25 mg (not administered)  ondansetron (ZOFRAN) injection 4 mg (4 mg Intravenous Given 12/10/15 0753)  HYDROmorphone (DILAUDID) injection 0.5 mg (0.5 mg Intravenous Given 12/10/15 1023)  HYDROmorphone (DILAUDID) injection 0.5 mg (0.5 mg Intravenous Given 12/10/15 1257)  carvedilol (COREG) tablet 6.25 mg (6.25 mg Oral Given 12/10/15 1427)     Initial Impression / Assessment and Plan / ED Course  I have reviewed the triage vital signs and the nursing notes.  Pertinent labs & imaging results that were available during my care of the patient were reviewed by me and considered in my medical decision making (see chart for details).  Clinical Course    X-rays reveal impacted displaced left inner trochanteric hip fracture. Care discussed with Dr. Rush Farmer and Dr. Erlinda Hong.  Also discussed with  internal medicine resident team regarding admission. We will undergo medical clearance, followed by ORIF of the hip.  Final Clinical Impressions(s) / ED Diagnoses   Final diagnoses:  Closed fracture of left hip, initial encounter [facility])  Surgery, elective    New Prescriptions Current Discharge Medication List    START taking these medications   Details  HYDROcodone-acetaminophen (NORCO) 10-325 MG tablet Take 1-2 tablets by mouth every 6 (  six) hours as needed for moderate pain or severe pain. Qty: 90 tablet, Refills: 0         Tanna Furry, MD 12/10/15 [phone]

## 2015-12-10 NOTE — Anesthesia Preprocedure Evaluation (Addendum)
Anesthesia Evaluation  Patient identified by MRN, date of birth, ID band Patient awake    Reviewed: Allergy & Precautions, NPO status , Patient's Chart, lab work & pertinent test results  Airway Mallampati: II  TM Distance: >3 FB Neck ROM: Full    Dental no notable dental hx.    Pulmonary asthma , former smoker,    Pulmonary exam normal breath sounds clear to auscultation       Cardiovascular hypertension, + CAD, + Peripheral Vascular Disease and +CHF  Normal cardiovascular exam Rhythm:Regular Rate:Normal     Neuro/Psych CVA negative psych ROS   GI/Hepatic negative GI ROS, Neg liver ROS,   Endo/Other  Hyperthyroidism   Renal/GU negative Renal ROS  negative genitourinary   Musculoskeletal  (+) Arthritis ,   Abdominal (+) + obese,   Peds negative pediatric ROS (+)  Hematology negative hematology ROS (+)   Anesthesia Other Findings   Reproductive/Obstetrics negative OB ROS                             Anesthesia Physical Anesthesia Plan  ASA: III  Anesthesia Plan: General   Post-op Pain Management:    Induction: Intravenous  Airway Management Planned: Oral ETT  Additional Equipment:   Intra-op Plan:   Post-operative Plan: Extubation in OR  Informed Consent: I have reviewed the patients History and Physical, chart, labs and discussed the procedure including the risks, benefits and alternatives for the proposed anesthesia with the patient or authorized representative who has indicated his/her understanding and acceptance.   Dental advisory given  Plan Discussed with: CRNA  Anesthesia Plan Comments:        Anesthesia Quick Evaluation

## 2015-12-10 NOTE — H&P (Signed)
Date: 12/10/2015               Patient Name:  Danielle [name] MRN: [id number]  DOB: [birth year] Age / Sex: 70 y.o., female   PCP: Glendale Chard, MD         Medical Service: Internal Medicine Teaching Service         Attending Physician: Dr. Michel Bickers, MD    First Contact: Dr. Ledell Noss  Pager: [phone]  Second Contact: Dr. Maryellen Pile Pager: [phone]       After Hours (After 5p/  First Contact Pager: [phone]  weekends / holidays): Second Contact Pager: [phone]   Chief Complaint: left hip fracture   History of Present Illness: [name] is a 70 y.o. female with a PMH of pelvic fracture, COPD, chronic systolic CHF, afib, aortic stenosis s/p valve replacement, hyperthyroid, with hx of spontaneous pneumothorax, hypertension and hyperlipidemia who presents with a hip fracture. She was discharged yesterday after hospitalization for spontaneous pneumothorax secondary to pulmonary blebs related to coughing spells. This morning she was getting out of bed when she tripped on a sheet that was hanging on the floor and fell and broke her hip. She was brought to [facility] [place] and found to have intertrochanteric femur fracture on hip xray. She was scheduled for surgery pending pre-operative evaluation. She has a history of aortic stenosis and aortic valve replacement in 2016 in [place]. She had a cath performed for preoperative evaluation before this procedure which she was told turned out normal. She states that she had a stress test in 2016 which was also normal. She denies any history of myocardial infarction and denies any symptoms of chest pain, tightness, palpitations, or leg swelling at this time. She has experienced orthopnea chronically and sleeps with two pillows, this has been stable. She is retired and her daily activity involves performing activities of daily living in her house.  She does not experience shortness of breath with these ADLs but  does have left knee pain and weakness which limit her movement and cause her to fall at times. She has a history of asthma which is controlled with symbicort twice daily, she only has to take her pro-air rescue inhaler about twice per month. She does have a history of TIA and experienced a stroke during her prior cardiac surgery procedures and was left with residual left sided weakness.   Meds:  Aspirin 81 mg daily Warfarin 5 mg daily  Albuterol inhaler PRN  Symbicort 160-4.5 inhaler  Methimazole 15 mg daily  Atorvastatin 40 mg daily  Ranitidine 150 mg BID   Allergies: Allergies as of 12/10/2015 - Review Complete 12/10/2015  Allergen Reaction Noted  . Tetanus toxoid adsorbed Swelling 08/19/2015   Past Medical History:  Diagnosis Date  . Aortic stenosis    Status post bioprosthetic AVR  . Arthritis    DJD  . Asthma   . Chronic systolic CHF (congestive heart failure) (HCC)    EF 30% 04/2014  . Coronary artery disease   . Gastritis   . Hyperlipidemia   . Hypertension   . Hyperthyroidism   . Multiple thyroid nodules   . Pelvis fracture ([place]) 08/19/2015   MULTIPLE   . Spontaneous pneumothorax 11/28/2015   left   . Stroke [facility])    Family History:  Mother - Diabetes mellitus and Myocardial infarction at age 23  Father- Lung CA  Sister- DM   Social History:  Lives at home with her husband, retired  previously worked in [employer]. Quit smoking 2 years ago, 5 pack year history.   Review of Systems: A complete ROS was negative except as per HPI.   Physical Exam: Vitals:   12/10/15 1300 12/10/15 1315 12/10/15 1330 12/10/15 1400  BP: 122/81 123/71 127/81 134/81  Pulse: 109 111 113 111  Resp: 26 (!) 33 (!) 28 (!) 29  Temp:      TempSrc:      SpO2: 95% 97% 98% 97%  Weight:      Height:       Physical Exam  Constitutional: She is oriented to person, place, and time. She appears well-developed and well-nourished. No distress.  Eyes: EOM are normal. Pupils are equal, round,  and reactive to light.  Cardiovascular: Normal rate and regular rhythm.   No murmur heard. Pulmonary/Chest: Effort normal. She has no wheezes. She has no rales.  Abdominal: Soft. She exhibits no distension. There is no tenderness.  Musculoskeletal: She exhibits no edema or tenderness.  Left leg is externally rotated   Neurological: She is alert and oriented to person, place, and time.  decreased muscle strength upper and lower extremities  Skin: Skin is warm and dry.  Psychiatric: She has a normal mood and affect. Her behavior is normal.   Labs: CBC:  Recent Labs Lab 12/04/15 0431 12/05/15 0357 12/10/15 0843  WBC 8.8 9.7 10.8*  NEUTROABS  --   --  9.1*  HGB 12.0 12.2 11.9*  HCT 37.2 38.0 35.4*  MCV 95.6 96.0 92.4  PLT 108* 103* 131*    Basic Metabolic Panel:  Recent Labs Lab 12/04/15 0431 12/05/15 0357 12/10/15 0843  NA 136 137 139  K 4.2 3.9 4.2  CL 102 105 109  CO2 '24 25 22  ' GLUCOSE 95 113* 117*  BUN '12 12 16  ' CREATININE 0.88 0.92 0.97  CALCIUM 8.9 9.2 9.6   Coagulation Studies:  Recent Labs  12/10/15 0843  LABPROT 13.8  INR 1.06   Liver Function Tests:  Recent Labs Lab 12/05/15 0357 12/10/15 0843  AST 20 29  ALT 14 34  ALKPHOS 78 85  BILITOT 0.9 0.8  PROT 5.4* 6.1*  ALBUMIN 2.6* 2.9*   Imaging: Chest X-ray  Pulmonary venous congestion. Small residual left apical pneumothorax. Stable enlarged cardiac silhouette.   Left Hip X-ray  Non displaced intertrochanteric femur fracture of the left hip. Stable prior nondisplaced fracture of the mid acetabulum.   Assessment & Plan by Problem:    Hip fracture ([place])   Fall at home [name] is a 70 y.o. female with PMH pelvic fracture, asthma, chronic systolic CHF, afib, aortic stenosis s/p valve replacement, hyperthyroid, with hx of spontaneous pneumothorax, hypertension and hyperlipidemia who presents with a hip fracture secondary to mechanical fall. She is moderate risk for  intraoperative major cardiac event for this low risk surgery. Although she has a history of abnormal EKG, she is not having any active chest pain. Her history of COPD may complicate her post operative course and she may require BiPAP post op. Orthopedic surgery is onboard and will perform surgery this afternoon. I do not know the cause of her mechanical falls and weakness however given her generalized decreased muscle strength on exam I believe deconditioning may be a component. She has no electrolyte abnormalities and no focal neurologic deficits.  -PT/OT evaluation after surgery   Fragility fracture  Osteoporosis  She has had two pathologic fractures which qualify her for osteoporosis. She will need to start fosamax therapy  four to six weeks after surgery as this may inhibit osteoclast activity and bone healing. Vitamin D deficiency can be associated with fractures, weakness, and difficulty walking so we will need to rule this out. Multiple myeloma must be ruled out but she has a normal calcium which makes this less likely and unremarkable CBC make lymphoma less likely.  -follow up Vitamin D level      Hypertension She is normotensive on presentation. Continue home medication lisinopril 5 mg daily.     Hyperlipidemia Continued home atorvastatin 40 mg.     COPD She denies any symptoms of shortness of breath, cough, or wheeze at this time. PFT 03/27/2015 FEV1/FVC 72%. We will continue her home medication Albuterol nebs PRN. She also takes symbicort inhaler BID at home.     Atrial fibrillation ([place]) She is in normal sinus rhythm on telemetry in the ED. We will continue her home medication carvedilol 25 mg and monitor. Her INR is 1.06 and not therapeutic, we will start coumadin by pharmacology consult.      Chronic systolic CHF (congestive heart failure) ([place]) Echo 03/2015 EF 30-35% and moderate global hypokinesia of left ventricle with normal left ventricular size. She reports orthopnea which is at  baseline but denies dyspnea on exertion and appears euvolemic on exam. We will continue her home medications Lisinopril 5 mg daily and carvedilol 25 mg BID.     3-vessel CAD Discovered on CT chest 12/01/2015. She denies chest pain or palpitations at this time. She states she has a history of normal cath in 2016.     Hyperthyroidism TSH 04/2015 0.52. We will continue her home medication Methimazole 15 mg daily.    Aortic atherosclerosis ([place]) Found on imaging this admission.   F none  E none  N NPO for surgery, regular diet after  DVT Ppx SCDs Code Status DNR   Dispo: Admit patient to Inpatient with expected length of stay greater than 2 midnights.  Signed: Ledell Noss, MD 12/10/2015, 2:55 PM  Pager: [phone]

## 2015-12-10 NOTE — ED Triage Notes (Signed)
[facility]- pt coming from home after a fall. Pt has shortening of the left hip. Hx of hip fracture. No LOC. No movement in the leg. Pt had 90mcg of Fentanyl PTA.

## 2015-12-10 NOTE — Progress Notes (Signed)
ANTICOAGULATION CONSULT NOTE - Initial Consult  Pharmacy Consult for Coumadin Indication:  AS s/p AVR, now s/p hip fracture repair  Allergies  Allergen Reactions  . Tetanus Toxoid Adsorbed Swelling    Unknown     Patient Measurements: Height: 5\' 7"  (170.2 cm) Weight: 214 lb (97.1 kg) IBW/kg (Calculated) : 61.6   Vital Signs: Temp: 98 F (36.7 C) (10/11 1745) Temp Source: Oral (10/11 0721) BP: 111/76 (10/11 1706) Pulse Rate: 91 (10/11 1745)  Labs:  Recent Labs  12/10/15 0843  HGB 11.9*  HCT 35.4*  PLT 131*  LABPROT 13.8  INR 1.06  CREATININE 0.97    Estimated Creatinine Clearance: 65.5 mL/min (by C-G formula based on SCr of 0.97 mg/dL).   Medical History: Past Medical History:  Diagnosis Date  . Aortic stenosis    Status post bioprosthetic AVR  . Arthritis    DJD  . Asthma   . Chronic systolic CHF (congestive heart failure) (HCC)    EF 30% 04/2014  . Coronary artery disease   . Gastritis   . Hyperlipidemia   . Hypertension   . Hyperthyroidism   . Multiple thyroid nodules   . Pelvis fracture ([place]) 08/19/2015   MULTIPLE   . Spontaneous pneumothorax 11/28/2015   left   . Stroke [facility])     Assessment: 70 year old female with AVR just recently discharged from the hospital 10/10 after spontaneous pneumothorax/VATS, Coumadin resumed at 5 mg daily (home dose). This AM she fell and sustained a hip fracture now s/p repair and to resume Coumadin  Goal of Therapy:  INR 2-3 Monitor platelets by anticoagulation protocol: Yes   Plan:  Coumadin 7.5 mg po x 1 tonight Daily INR  Thank you Anette Guarneri, PharmD [phone]  Tad Moore 12/10/2015,5:59 PM

## 2015-12-10 NOTE — Anesthesia Postprocedure Evaluation (Signed)
Anesthesia Post Note  Patient: Danielle [name]  Procedure(s) Performed: Procedure(s) (LRB): INTRAMEDULLARY (IM) NAIL INTERTROCHANTRIC (Left)  Patient location during evaluation: PACU Anesthesia Type: General Level of consciousness: awake and alert Pain management: pain level controlled Vital Signs Assessment: post-procedure vital signs reviewed and stable Respiratory status: spontaneous breathing, nonlabored ventilation, respiratory function stable and patient connected to nasal cannula oxygen Cardiovascular status: blood pressure returned to baseline and stable Postop Assessment: no signs of nausea or vomiting Anesthetic complications: no    Last Vitals:  Vitals:   12/10/15 1750 12/10/15 1946  BP: 121/65 (!) 109/53  Pulse: 88 90  Resp: 18 18  Temp: 36.7 C 37 C    Last Pain:  Vitals:   12/10/15 1946  TempSrc: Oral  PainSc:                  Park Beck J

## 2015-12-10 NOTE — ED Notes (Signed)
Pt used bedpan. Pt tolerated well.

## 2015-12-10 NOTE — Anesthesia Procedure Notes (Signed)
Procedure Name: Intubation Date/Time: 12/10/2015 3:10 PM Performed by: Tressia Miners LEFFEW Pre-anesthesia Checklist: Patient identified, Patient being monitored, Timeout performed, Emergency Drugs available and Suction available Patient Re-evaluated:Patient Re-evaluated prior to inductionOxygen Delivery Method: Circle System Utilized Preoxygenation: Pre-oxygenation with 100% oxygen Intubation Type: IV induction Ventilation: Mask ventilation without difficulty, Oral airway inserted - appropriate to patient size and Two handed mask ventilation required Laryngoscope Size: Mac and 3 Grade View: Grade I Tube type: Oral Tube size: 7.0 mm Number of attempts: 1 Airway Equipment and Method: Stylet Placement Confirmation: ETT inserted through vocal cords under direct vision,  positive ETCO2 and breath sounds checked- equal and bilateral Secured at: 22 cm Tube secured with: Tape Dental Injury: Teeth and Oropharynx as per pre-operative assessment

## 2015-12-10 NOTE — Consult Note (Signed)
   [facility] CM Inpatient Consult   12/10/2015  Danielle [name] 03-03-1945 [id number]  Late entry for 12/09/15  1550:  Patient evaluated for community based chronic disease management services with [place] [facility] as a benefit of patient's Medicare Insurance. Spoke with patient at bedside to explain [place] [facility] services.  Patient will receive post hospital discharge call and will be evaluated for monthly home visits for assessments and disease process education.  Consent was signed.  Left contact information and [facility] literature at bedside. Made Inpatient Case Manager aware that [place] Management following. Of note, [facility] Care Management services does not replace or interfere with any services that are arranged by inpatient case management or social work.  For additional questions or referrals please contact:   Natividad Brood, RN BSN [facility] Liaison  [phone] business mobile phone Toll free office [phone]

## 2015-12-10 NOTE — Transfer of Care (Signed)
Immediate Anesthesia Transfer of Care Note  Patient: Danielle [name]  Procedure(s) Performed: Procedure(s): INTRAMEDULLARY (IM) NAIL INTERTROCHANTRIC (Left)  Patient Location: PACU  Anesthesia Type:General  Level of Consciousness: awake, alert , oriented, patient cooperative and responds to stimulation  Airway & Oxygen Therapy: Patient Spontanous Breathing and Patient connected to face mask oxygen  Post-op Assessment: Report given to RN, Post -op Vital signs reviewed and stable and Patient moving all extremities X 4  Post vital signs: Reviewed and stable  Last Vitals:  Vitals:   12/10/15 1330 12/10/15 1400  BP: 127/81 134/81  Pulse: 113 111  Resp: (!) 28 (!) 29  Temp:      Last Pain:  Vitals:   12/10/15 1425  TempSrc:   PainSc: 10-Worst pain ever         Complications: No apparent anesthesia complications

## 2015-12-11 ENCOUNTER — Encounter (HOSPITAL_COMMUNITY): Payer: Self-pay | Admitting: Orthopaedic Surgery

## 2015-12-11 DIAGNOSIS — E0590 Thyrotoxicosis, unspecified without thyrotoxic crisis or storm: Secondary | ICD-10-CM

## 2015-12-11 DIAGNOSIS — E785 Hyperlipidemia, unspecified: Secondary | ICD-10-CM

## 2015-12-11 DIAGNOSIS — I2510 Atherosclerotic heart disease of native coronary artery without angina pectoris: Secondary | ICD-10-CM

## 2015-12-11 DIAGNOSIS — I959 Hypotension, unspecified: Secondary | ICD-10-CM

## 2015-12-11 DIAGNOSIS — I700 Atherosclerosis of aorta: Secondary | ICD-10-CM

## 2015-12-11 DIAGNOSIS — J9383 Other pneumothorax: Secondary | ICD-10-CM

## 2015-12-11 DIAGNOSIS — Z79899 Other long term (current) drug therapy: Secondary | ICD-10-CM

## 2015-12-11 DIAGNOSIS — Z87311 Personal history of (healed) other pathological fracture: Secondary | ICD-10-CM

## 2015-12-11 DIAGNOSIS — I480 Paroxysmal atrial fibrillation: Secondary | ICD-10-CM

## 2015-12-11 DIAGNOSIS — R338 Other retention of urine: Secondary | ICD-10-CM

## 2015-12-11 DIAGNOSIS — Z952 Presence of prosthetic heart valve: Secondary | ICD-10-CM

## 2015-12-11 DIAGNOSIS — M810 Age-related osteoporosis without current pathological fracture: Secondary | ICD-10-CM

## 2015-12-11 DIAGNOSIS — W010XXD Fall on same level from slipping, tripping and stumbling without subsequent striking against object, subsequent encounter: Secondary | ICD-10-CM

## 2015-12-11 DIAGNOSIS — Z7901 Long term (current) use of anticoagulants: Secondary | ICD-10-CM

## 2015-12-11 DIAGNOSIS — I5022 Chronic systolic (congestive) heart failure: Secondary | ICD-10-CM

## 2015-12-11 DIAGNOSIS — R339 Retention of urine, unspecified: Secondary | ICD-10-CM

## 2015-12-11 DIAGNOSIS — J449 Chronic obstructive pulmonary disease, unspecified: Secondary | ICD-10-CM

## 2015-12-11 DIAGNOSIS — S72002D Fracture of unspecified part of neck of left femur, subsequent encounter for closed fracture with routine healing: Secondary | ICD-10-CM

## 2015-12-11 LAB — BASIC METABOLIC PANEL
ANION GAP: 6 (ref 5–15)
BUN: 10 mg/dL (ref 6–20)
CO2: 24 mmol/L (ref 22–32)
Calcium: 8.6 mg/dL — ABNORMAL LOW (ref 8.9–10.3)
Chloride: 106 mmol/L (ref 101–111)
Creatinine, Ser: 1.08 mg/dL — ABNORMAL HIGH (ref 0.44–1.00)
GFR calc non Af Amer: 51 mL/min — ABNORMAL LOW (ref >60)
GFR, EST AFRICAN AMERICAN: 59 mL/min — AB (ref >60)
Glucose, Bld: 121 mg/dL — ABNORMAL HIGH (ref 65–99)
Potassium: 3.7 mmol/L (ref 3.5–5.1)
Sodium: 136 mmol/L (ref 135–145)

## 2015-12-11 LAB — CBC
HCT: 29 % — ABNORMAL LOW (ref 36.0–46.0)
HEMOGLOBIN: 9.5 g/dL — AB (ref 12.0–15.0)
MCH: 30.4 pg (ref 26.0–34.0)
MCHC: 32.8 g/dL (ref 30.0–36.0)
MCV: 92.9 fL (ref 78.0–100.0)
Platelets: 107 10*3/uL — ABNORMAL LOW (ref 150–400)
RBC: 3.12 MIL/uL — AB (ref 3.87–5.11)
RDW: 13.2 % (ref 11.5–15.5)
WBC: 12.7 10*3/uL — ABNORMAL HIGH (ref 4.0–10.5)

## 2015-12-11 LAB — PROTIME-INR
INR: 1.26
PROTHROMBIN TIME: 15.9 s — AB (ref 11.4–15.2)

## 2015-12-11 MED ORDER — KETOROLAC TROMETHAMINE 30 MG/ML IJ SOLN: 30.0000 mg | Freq: Four times a day (QID) | INTRAMUSCULAR | Status: AC | PRN

## 2015-12-11 MED ORDER — OXYCODONE HCL 5 MG PO TABS
5.0000 mg | ORAL_TABLET | ORAL | Status: DC | PRN
  Administered 2015-12-12: 5 mg via ORAL
  Filled 2015-12-11: qty 1

## 2015-12-11 MED ORDER — WARFARIN SODIUM 7.5 MG PO TABS
7.5000 mg | ORAL_TABLET | Freq: Once | ORAL | Status: AC
  Administered 2015-12-11: 7.5 mg via ORAL
  Filled 2015-12-11: qty 1

## 2015-12-11 NOTE — Progress Notes (Signed)
Patient ID: Danielle [name], female   DOB: [birth year], 70 y.o.   MRN: [id number]   Date of Admission:  12/10/2015            Examined Danielle [name] this morning with my medical team. She was recently discharged from the hospital after VATS surgery for pneumothorax. She noted that her mobility was somewhat compromised by deconditioning after the surgery. Yesterday she tripped and fell suffering a left hip fracture. She underwent open reduction and internal fixation and had no difficulties with surgery. She has a history of aortic valve replacement and paroxysmal atrial fibrillation. She has been on warfarin and was subtherapeutic on admission. She is now back on warfarin and bridging with enoxaparin.  Michel Bickers, MD [facility] for [facility] [place] [facility] [phone] pager   [phone] cell 03/04/2015, 1:32 PM

## 2015-12-11 NOTE — Progress Notes (Signed)
Talked to internal medicine about patient's voiding. Still unable to void, internal medicine suggested in and out now, foley within 6 hours if patient does not void Danielle [name] A Archivist, RN

## 2015-12-11 NOTE — Progress Notes (Signed)
I was notified by the nurse for hypotension.  Last two blood pressures were 85/47 and 82/45.  Patient is on several narcotic medications including Norco, morphine, and two different orders of Oxycodone.  Her renal function is stable. I will discontinue current pain regimen and order Toradol 30mg  IV Q6H PRN and Oxycodone 5mg  Q4H PRN for break through pain and reassess until blood pressures improve.  I will inform the morning team.

## 2015-12-11 NOTE — Progress Notes (Signed)
      [address]       [place] [address]             [phone]      Social visit   Heard of patients fall and femur fracture  No respiratory issues at present  [place]. Roxan Hockey, MD [facility] [phone]

## 2015-12-11 NOTE — Evaluation (Signed)
Physical Therapy Evaluation Patient Details Name: Danielle [name] MRN: [id number] DOB: Sep 12, 1945 Today's Date: 12/11/2015   History of Present Illness  70 y.o. female post op of Treatment of Lt intertrochanteric fracture with intramedullary implant due to a fall in her home. Pt has a history of COPD, chronic systolic CHF, afib, aortic stenosis s/p valve replacement, hyperthyroid, with hx of spontaneous pneumothorax, hypertension and experienced a stroke during her prior cardiac surgery procedures and was left with residual left sided weakness. She was discharged 10/10 after hospitalization for spontaneous pneumothorax related to coughing spells. On 10/11 she was getting out of bed when she tripped on a sheet that was hanging on the floor and fell and broke her hip.     Clinical Impression  Patient is s/p above surgery resulting in functional limitations due to the deficits listed below (see PT Problem List). Patient very anxious re: any movement due to increasing her pain. Knows she needs to move, states she wants to move, however screams with any movement and puts forth little effort (due to fear of pain). Anticipate better progress with improved pain control (states that morphine "does nothing for me.") Patient will benefit from skilled PT to increase their independence and safety with mobility to allow discharge to the venue listed below.       Follow Up Recommendations SNF;Supervision/Assistance - 24 hour    Equipment Recommendations  None recommended by PT    Recommendations for Other Services       Precautions / Restrictions Precautions Precautions: Fall Restrictions RLE Weight Bearing: Weight bearing as tolerated      Mobility  Bed Mobility Overal bed mobility: Needs Assistance;+2 for physical assistance;+ 2 for safety/equipment Bed Mobility: Supine to Sit;Sit to Supine     Supine to sit: Max assist;+2 for physical assistance;HOB elevated Sit to supine: Total  assist;+2 for physical assistance;+2 for safety/equipment   General bed mobility comments: Patient anxious and painful (despite pre-medication and IV pain meds); slowly elevated to upright sitting and then took legs over EOB to sit EOB;   Transfers Overall transfer level: Needs assistance Equipment used: Rolling walker (2 wheeled)             General transfer comment: Attempted sit to stand with patient anxious and hurting with limited effort. Unable to even raise hips off elevated bed.  Ambulation/Gait                Stairs            Wheelchair Mobility    Modified Rankin (Stroke Patients Only)       Balance Overall balance assessment: Needs assistance;History of Falls Sitting-balance support: Single extremity supported;Feet supported Sitting balance-Leahy Scale: Poor Sitting balance - Comments: leans to Rt (offloading painful Lt hip) and uses RUE for support Postural control: Right lateral lean                                   Pertinent Vitals/Pain On 3L O2 with SaO2 85-92%; pt with breath holding and required education and cues for breathing. Pt required incr to 4L O2 while sitting EOB to achieve SaO2 90%. HR 102-118 bpm  Pain Assessment: 0-10 Pain Score: 8  Pain Location: Left hip Pain Descriptors / Indicators: Grimacing;Crying;Operative site guarding;Stabbing Pain Intervention(s): Limited activity within patient's tolerance;Monitored during session;Premedicated before session;Repositioned;Patient requesting pain meds-RN notified;RN gave pain meds during session    Home Living Family/patient expects to  be discharged to:: Skilled nursing facility Living Arrangements: Other relatives (sister, cousin (and cousin's fiance)) Available Help at Discharge: Family;Available 24 hours/day Type of Home: House Home Access: Ramped entrance     Home Layout: One level Home Equipment: Walker - 2 wheels;Cane - single point      Prior Function  Level of Independence: Independent         Comments: but states she was weak from recent hospitalization     Hand Dominance   Dominant Hand: Right    Extremity/Trunk Assessment   Upper Extremity Assessment: Defer to OT evaluation           Lower Extremity Assessment: RLE deficits/detail;LLE deficits/detail RLE Deficits / Details: AROM WFL; strength grossly 3+ LLE Deficits / Details: severely limited by pain (including AAROM or PROM); strength limited due to pain (currently 1+ to 2-)  Cervical / Trunk Assessment: Other exceptions  Communication   Communication: No difficulties  Cognition Arousal/Alertness: Awake/alert Behavior During Therapy: Anxious Overall Cognitive Status: Within Functional Limits for tasks assessed                      General Comments      Exercises General Exercises - Lower Extremity Ankle Circles/Pumps: AROM;Both;5 reps Quad Sets: AROM;Right;AAROM;Left;5 reps;Limitations Quad Sets Limitations: did best with hand under her knee "don't let me lift/bend your knee" Heel Slides: AROM;Right;AAROM;Left;5 reps   Assessment/Plan    PT Assessment Patient needs continued PT services  PT Problem List Decreased strength;Decreased range of motion;Decreased activity tolerance;Decreased balance;Decreased mobility;Decreased knowledge of use of DME;Cardiopulmonary status limiting activity;Impaired sensation;Obesity;Pain          PT Treatment Interventions DME instruction;Gait training;Functional mobility training;Therapeutic activities;Therapeutic exercise;Balance training;Patient/family education    PT Goals (Current goals can be found in the Care Plan section)  Acute Rehab PT Goals Patient Stated Goal: Be able to walk and return home PT Goal Formulation: With patient Time For Goal Achievement: 12/25/15 Potential to Achieve Goals: Good    Frequency Min 3X/week   Barriers to discharge Decreased caregiver support      Co-evaluation  PT/OT/SLP Co-Evaluation/Treatment: Yes Reason for Co-Treatment: For patient/therapist safety PT goals addressed during session: Mobility/safety with mobility;Balance;Proper use of DME;Strengthening/ROM         End of Session Equipment Utilized During Treatment: Gait belt;Oxygen Activity Tolerance: Patient limited by fatigue;Patient limited by pain Patient left: in bed;with call bell/phone within reach;with nursing/sitter in room;with SCD's reapplied Nurse Communication: Mobility status         Time: 1005-1057 PT Time Calculation (min) (ACUTE ONLY): 52 min   Charges:   PT Evaluation $PT Eval Moderate Complexity: 1 Procedure     PT G Codes:        [place] Sigmund 12-31-15, 1:01 PM Pager [phone]

## 2015-12-11 NOTE — Consult Note (Signed)
[facility] CM Primary Care Navigator  12/11/2015  Danielle [name] 21-Aug-1945 [id number]  Met with patient and husband Danielle [name]) at the bedside to identify possible discharge needs. Patient states her walker was caught in the comforter when she was getting out of bed to use the bathroom that caused her to fall and led to this admission/ surgery. She endorses Dr. Glendale [name] with [facility] as the primary care provider.    Patient shared using [facility] to obtain medications and manages her own medicines at home using "pill box" system. Patient states paying $40 for her Symbicort inhaler and is made aware to request medication assistance to be processed at her provider's office for this medication.   Husband provides transportation to her doctors' appointments and he is the primary caregiver at home as stated.   She states that plan for possible inpatient rehabilitation was mentioned as option for her but no specifics yet as stated.  Patient voiced understanding to call primary care provider's office for a post discharge follow-up appointment within a week or sooner if needs arise. Patient letter provided for her reminder.  She denies additional needs or concerns at this time.    For additional questions please contact:  Danielle [name], BSN, RN-BC [facility] PRIMARY CARE Navigator Cell: [phone]

## 2015-12-11 NOTE — Progress Notes (Signed)
ANTICOAGULATION CONSULT NOTE - FOLLOW UP  Pharmacy Consult:  Coumadin Indication:  Afib  Allergies  Allergen Reactions  . Tetanus Toxoid Adsorbed Swelling    Unknown     Patient Measurements: Height: 5\' 7"  (170.2 cm) Weight: 214 lb (97.1 kg) IBW/kg (Calculated) : 61.6  Vital Signs: Temp: 98.2 F (36.8 C) (10/12 0542) Temp Source: Oral (10/12 0542) BP: 112/72 (10/12 0542) Pulse Rate: 100 (10/12 0542)  Labs:  Recent Labs  12/10/15 0843 12/11/15 0424  HGB 11.9* 9.5*  HCT 35.4* 29.0*  PLT 131* 107*  LABPROT 13.8 15.9*  INR 1.06 1.26  CREATININE 0.97 1.08*    Estimated Creatinine Clearance: 58.8 mL/min (by C-G formula based on SCr of 1.08 mg/dL (H)).   Assessment: 70 year old female with history of Afib and AVR recently discharged from the hospital on 12/09/15 after spontaneous pneumothorax/VATS.  Patient fell and sustained a hip fracture, now s/p repair and to continue on Coumadin and Lovenox.  Yesterday's Coumadin dose was not charted as given.  INR remains sub-therapeutic; no bleeding reported.   Goal of Therapy:  INR 2-3 Monitor platelets by anticoagulation protocol: Yes    Plan:  - Coumadin 7.5mg  PO today - Continue Lovenox 40mg  SQ Q24H until INR is therapeutic - Daily PT / INR   Krystal Teachey D. Mina Marble, PharmD, BCPS Pager:  [phone] 12/11/2015, 11:31 AM

## 2015-12-11 NOTE — Progress Notes (Signed)
Pt complains of being cold, low grade temp 99.0 F. BP 93/53, respirations 28, pulse 98. Physician notified. Nurse will continue to monitor Danielle Troung A Elmer Boutelle, RN

## 2015-12-11 NOTE — Progress Notes (Signed)
Subjective: [name] had uncomplicated left hip reduction and internal fixation performed yesterday. She say today she is feeling fine and she was able to get out of bed and walk around her room with physical therapy. She denies any shortness of breath, chest pain, abdominal pain.  Objective:  Vital signs in last 24 hours: Vitals:   12/10/15 2226 12/11/15 0005 12/11/15 0542 12/11/15 0719  BP:  110/65 112/72   Pulse:  (!) 111 100   Resp:  18 18   Temp:  98.2 F (36.8 C) 98.2 F (36.8 C)   TempSrc:  Oral Oral   SpO2: 95% 94%  94%  Weight:      Height:       Physical Exam  Constitutional: She appears well-developed and well-nourished. No distress.  Cardiovascular: Normal rate and regular rhythm.   No murmur heard. Pulmonary/Chest: She has no wheezes. She has no rales.  Abdominal: Soft. Bowel sounds are normal. She exhibits no distension. There is no tenderness.  Extremities: no calf tenderness, no peripheral edema, left hip surgical dressings clean dry and intact  Medications: Infusions: . sodium chloride 75 mL/hr at 12/11/15 1136  . lactated ringers 50 mL/hr at 12/10/15 1448   Scheduled Medications: . aspirin EC  81 mg Oral Daily  . atorvastatin  40 mg Oral QHS  . carvedilol  25 mg Oral BID WC  . enoxaparin (LOVENOX) injection  40 mg Subcutaneous Q24H  . methimazole  15 mg Oral QPM  . mometasone-formoterol  2 puff Inhalation BID  . senna-docusate  1 tablet Oral BID  . warfarin  7.5 mg Oral ONCE-1800  . Warfarin - Pharmacist Dosing Inpatient   Does not apply q1800   PRN Medications: acetaminophen **OR** acetaminophen, albuterol, alum & mag hydroxide-simeth, HYDROcodone-acetaminophen, menthol-cetylpyridinium **OR** phenol, methocarbamol **OR** methocarbamol (ROBAXIN)  IV, metoCLOPramide **OR** metoCLOPramide (REGLAN) injection, morphine injection, ondansetron **OR** ondansetron (ZOFRAN) IV, oxyCODONE, oxyCODONE  Assessment/Plan:   Hip fracture  (HCC)   Fall at home This Danielle [name] is a 70 year old female has medical history of spontaneous pneumothorax, pelvic fracture, COPD, chronic 6 systolic CHF, A. fib, aortic stenosis status post valve replacement, hyperthyroidism, hypertension, hyperlipidemia who presented with hip fracture secondary to mechanical fall. She had no complicated reduction and internal fixation of her left hip hip fracture yesterday. Physical therapy has worked with her today and found that she had decreased strength and balance which may be related to her recent loss. She has increased creatinine and decreased hemoglobin today which are most likely related to surgery.    Osteoporosis Her history of 2 pathologic fragility fractures are not a medical diagnosis of osteoporosis. We'll start Fosamax therapy at discharge. -follow up vitamin D   COPD Denies any symptoms of shortness of breath this time. She is on 2 L of oxygen and satting at 89% which is optimal in the setting of CO2 retention. She says she has an increased rate of breathing at baseline. We will continue her home medication albuterol as needed.    Atrial fibrillation ([place]) She has normal rate on exam today. Her continue her home medication carvedilol 25 mg. INR is 1.26 today, we will continue her Lovenox to Coumadin bridge to pharmacology consult.    Chronic systolic CHF (congestive heart failure) (HCC) We will continue her home medication carvedilol 25 mg twice a day. She should be on ACE therapy for this but is hypotensive at this time so we will hold off starting any new therapy.  3-vessel CAD Diffuse coronary calcifications was an incidental finding on CT chest 11/2015 however she says that she had a cardiac cath in 2016 which was normal. She continues to deny chest pain at this time. She should be on ACE therapy for this but is hypotensive at this time so we will hold off starting any new therapy.     Hyperthyroidism We'll continue her home  medication methimazole 15 mg daily.    Lung blebs ([place]) Earlier this month she was hospitalized for spontaneous pneumothorax secondary to ruptured bleb. She had VATS surgery for this. Chest xray on this admission showed some residual left lung pneumothorax without tension which is asymptomatic.    Aortic atherosclerosis ([place]) Discovered on imaging this admission.     Acute urinary retention She has had decreased urinary output post surgery.     Hyperlipidemia Continue home medication atorvastatin 40 mg daily.  Dispo: Anticipated discharge in approximately 2-4 day(s).   LOS: 1 day   Danielle Noss, MD 12/11/2015, 3:52 PM Pager: [phone]

## 2015-12-11 NOTE — Evaluation (Signed)
Occupational Therapy Evaluation Patient Details Name: Danielle [name] MRN: [id number] DOB: 12-07-1945 Today's Date: 12/11/2015    History of Present Illness 70 y.o. female post op of Treatment of Lt intertrochanteric fracture with intramedullary implant due to a fall in her home. Pt has a history of COPD, chronic systolic CHF, afib, aortic stenosis s/p valve replacement, hyperthyroid, with hx of spontaneous pneumothorax, hypertension and experienced a stroke during her prior cardiac surgery procedures and was left with residual left sided weakness. She was discharged 10/10 after hospitalization for spontaneous pneumothorax related to coughing spells. On 10/11 she was getting out of bed when she tripped on a sheet that was hanging on the floor and fell and broke her hip.    Clinical Impression   PTA Pt independent in ADL and used RW for mobility. Pt currently able to perform bed level ADL with set up, and not able to mobilize during todays session due to pain. Pt willing to work with therapy and made several attempts despite pain. Pt will benefit from skilled OT in the acute care setting prior to stay at SNF to maximize independence in ADL, and safety.    Follow Up Recommendations  SNF    Equipment Recommendations  Other (comment) (TBD at next venue of care)    Recommendations for Other Services       Precautions / Restrictions Precautions Precautions: Fall Restrictions Weight Bearing Restrictions: Yes LLE Weight Bearing: Weight bearing as tolerated      Mobility Bed Mobility Overal bed mobility: Needs Assistance;+2 for physical assistance;+ 2 for safety/equipment Bed Mobility: Supine to Sit;Sit to Supine     Supine to sit: Max assist;+2 for physical assistance;HOB elevated Sit to supine: Total assist;+2 for physical assistance;+2 for safety/equipment   General bed mobility comments: Patient anxious and painful (despite pre-medication and IV pain meds); slowly  elevated to upright sitting and then took legs over EOB to sit EOB;   Transfers Overall transfer level: Needs assistance Equipment used: Rolling walker (2 wheeled)             General transfer comment: Attempted sit to stand with patient anxious and hurting with limited effort. Unable to even raise hips off elevated bed.    Balance Overall balance assessment: Needs assistance Sitting-balance support: Single extremity supported;Feet supported Sitting balance-Leahy Scale: Poor Sitting balance - Comments: leans to Rt (offloading painful Lt hip) and uses RUE for support Postural control: Right lateral lean                                  ADL Overall ADL's : Needs assistance/impaired Eating/Feeding: Set up;Bed level   Grooming: Wash/dry hands;Wash/dry face;Bed level           Upper Body Dressing : Moderate assistance;Bed level   Lower Body Dressing: Total assistance;+2 for physical assistance;Bed level   Toilet Transfer: +2 for physical assistance;+2 for safety/equipment;Requires wide/bariatric;Maximal assistance Toilet Transfer Details (indicate cue type and reason): sat EOB and unable to stand pivot to [facility], at this time need to use bed pan         Functional mobility during ADLs: Maximal assistance;+2 for physical assistance;+2 for safety/equipment;Rolling walker (unable to get to stand during this session) General ADL Comments: Pt willing and able to work with therapy, movement increases pain to where the patient cries out. Pt able to do bed level ADLs     Vision     Perception  Praxis      Pertinent Vitals/Pain Pain Assessment: 0-10 Pain Score: 8  Pain Location: left hip Pain Descriptors / Indicators: Grimacing;Crying;Stabbing;Operative site guarding Pain Intervention(s): Limited activity within patient's tolerance;Monitored during session;Premedicated before session;Repositioned;Patient requesting pain meds-RN notified;RN gave pain meds  during session     Hand Dominance Right   Extremity/Trunk Assessment Upper Extremity Assessment Upper Extremity Assessment: Generalized weakness;RUE deficits/detail RUE Deficits / Details: right sided weakness from previous stroke RUE Sensation: decreased light touch RUE Coordination: decreased fine motor   Lower Extremity Assessment Lower Extremity Assessment: Defer to PT evaluation RLE Deficits / Details: AROM WFL; strength grossly 3+ RLE Sensation: decreased light touch LLE Deficits / Details: severely limited by pain (including AAROM or PROM); strength limited due to pain (currently 1+ to 2-)   Cervical / Trunk Assessment Cervical / Trunk Assessment: Other exceptions Cervical / Trunk Exceptions: obese   Communication Communication Communication: No difficulties   Cognition Arousal/Alertness: Awake/alert Behavior During Therapy: Anxious Overall Cognitive Status: Within Functional Limits for tasks assessed                     General Comments       Exercises       Shoulder Instructions      Home Living Family/patient expects to be discharged to:: Skilled nursing facility Living Arrangements: Other (Comment) (sister, cousin (and cousin's fiance)) Available Help at Discharge: Family;Available 24 hours/day Type of Home: House Home Access: Ramped entrance     Home Layout: One level     Bathroom Shower/Tub: Occupational psychologist: Standard Bathroom Accessibility: Yes How Accessible: Accessible via wheelchair Home Equipment: [place] - 2 wheels;Cane - single point          Prior Functioning/Environment Level of Independence: Independent        Comments: but states she was weak from recent hospitalization        OT Problem List: Decreased strength;Decreased range of motion;Decreased activity tolerance;Impaired balance (sitting and/or standing);Decreased knowledge of use of DME or AE;Obesity;Pain   OT Treatment/Interventions:  Self-care/ADL training;Therapeutic exercise;Energy conservation;DME and/or AE instruction;Therapeutic activities;Patient/family education;Balance training    OT Goals(Current goals can be found in the care plan section) Acute Rehab OT Goals Patient Stated Goal: Be able to walk and return home OT Goal Formulation: With patient Time For Goal Achievement: 12/18/15 Potential to Achieve Goals: Good ADL Goals Pt Will Perform Grooming: sitting;with supervision Pt Will Perform Lower Body Dressing: with min assist;sit to/from stand Pt Will Transfer to Toilet: with min assist;stand pivot transfer;bedside commode Pt Will Perform Toileting - Clothing Manipulation and hygiene: with min assist;sit to/from stand  OT Frequency: Min 2X/week   Barriers to D/C: Decreased caregiver support  husband works 6am-6pm       Co-evaluation PT/OT/SLP Co-Evaluation/Treatment: Yes Reason for Co-Treatment: For Doctor, hospital PT goals addressed during session: Mobility/safety with mobility OT goals addressed during session: ADL's and self-care      End of Session Equipment Utilized During Treatment: Rolling walker;Oxygen Nurse Communication: Patient requests pain meds  Activity Tolerance: Patient limited by pain;Patient limited by fatigue Patient left: in bed;with call bell/phone within reach;with nursing/sitter in room   Time: 1000-1100 OT Time Calculation (min): 60 min Charges:  OT General Charges $OT Visit: 1 Procedure OT Evaluation $OT Eval Moderate Complexity: 1 Procedure OT Treatments $Self Care/Home Management : 8-22 mins G-Codes:    Merri Ray Jatavious Peppard 01/07/2016, 5:44 PM Hulda Humphrey OTR/L [phone]

## 2015-12-11 NOTE — Progress Notes (Signed)
   Subjective:  Patient reports pain as moderate.  Objective:   VITALS:   Vitals:   12/10/15 2226 12/11/15 0005 12/11/15 0542 12/11/15 0719  BP:  110/65 112/72   Pulse:  (!) 111 100   Resp:  18 18   Temp:  98.2 F (36.8 C) 98.2 F (36.8 C)   TempSrc:  Oral Oral   SpO2: 95% 94%  94%  Weight:      Height:        Neurologically intact Neurovascular intact Sensation intact distally Intact pulses distally Dorsiflexion/Plantar flexion intact Incision: dressing C/D/I and no drainage No cellulitis present Compartment soft   Lab Results  Component Value Date   WBC 12.7 (H) 12/11/2015   HGB 9.5 (L) 12/11/2015   HCT 29.0 (L) 12/11/2015   MCV 92.9 12/11/2015   PLT 107 (L) 12/11/2015     Assessment/Plan:  1 Day Post-Op   - Expected postop acute blood loss anemia - will monitor for symptoms - Up with PT/OT - anticipate patient will be slow to mobilize, will need SNF - DVT ppx - SCDs, ambulation, lovenox bridge to coumadin - WBAT operative extremity - Pain control   Danielle [name] Ephriam Jenkins 12/11/2015, 7:31 AM [phone]

## 2015-12-11 NOTE — Progress Notes (Signed)
Patient states she still cannot void. Bladder scanned with reading of 339. Nurse will continue to monitor.

## 2015-12-12 ENCOUNTER — Inpatient Hospital Stay (HOSPITAL_COMMUNITY): Payer: Medicare Other

## 2015-12-12 DIAGNOSIS — D649 Anemia, unspecified: Secondary | ICD-10-CM

## 2015-12-12 DIAGNOSIS — D696 Thrombocytopenia, unspecified: Secondary | ICD-10-CM

## 2015-12-12 DIAGNOSIS — Z960 Presence of urogenital implants: Secondary | ICD-10-CM

## 2015-12-12 DIAGNOSIS — E559 Vitamin D deficiency, unspecified: Secondary | ICD-10-CM

## 2015-12-12 DIAGNOSIS — R509 Fever, unspecified: Secondary | ICD-10-CM

## 2015-12-12 LAB — BASIC METABOLIC PANEL
ANION GAP: 8 (ref 5–15)
BUN: 10 mg/dL (ref 6–20)
CALCIUM: 8.3 mg/dL — AB (ref 8.9–10.3)
CO2: 21 mmol/L — ABNORMAL LOW (ref 22–32)
Chloride: 107 mmol/L (ref 101–111)
Creatinine, Ser: 0.91 mg/dL (ref 0.44–1.00)
GLUCOSE: 105 mg/dL — AB (ref 65–99)
Potassium: 4 mmol/L (ref 3.5–5.1)
SODIUM: 136 mmol/L (ref 135–145)

## 2015-12-12 LAB — CBC
HCT: 24.9 % — ABNORMAL LOW (ref 36.0–46.0)
Hemoglobin: 8.2 g/dL — ABNORMAL LOW (ref 12.0–15.0)
MCH: 30.4 pg (ref 26.0–34.0)
MCHC: 32.9 g/dL (ref 30.0–36.0)
MCV: 92.2 fL (ref 78.0–100.0)
PLATELETS: 97 10*3/uL — AB (ref 150–400)
RBC: 2.7 MIL/uL — ABNORMAL LOW (ref 3.87–5.11)
RDW: 13.1 % (ref 11.5–15.5)
WBC: 12.3 10*3/uL — AB (ref 4.0–10.5)

## 2015-12-12 LAB — VITAMIN D 25 HYDROXY (VIT D DEFICIENCY, FRACTURES): Vit D, 25-Hydroxy: 21.4 ng/mL — ABNORMAL LOW (ref 30.0–100.0)

## 2015-12-12 LAB — PROTIME-INR
INR: 1.4
Prothrombin Time: 17.2 seconds — ABNORMAL HIGH (ref 11.4–15.2)

## 2015-12-12 MED ORDER — OXYCODONE HCL 5 MG PO TABS
5.0000 mg | ORAL_TABLET | ORAL | Status: DC | PRN
  Administered 2015-12-12 – 2015-12-15 (×11): 5 mg via ORAL
  Filled 2015-12-12 (×11): qty 1

## 2015-12-12 MED ORDER — CHOLECALCIFEROL 10 MCG (400 UNIT) PO TABS
400.0000 [IU] | ORAL_TABLET | Freq: Every day | ORAL | Status: DC
  Administered 2015-12-12 – 2015-12-15 (×4): 400 [IU] via ORAL
  Filled 2015-12-12 (×4): qty 1

## 2015-12-12 MED ORDER — WARFARIN SODIUM 7.5 MG PO TABS
7.5000 mg | ORAL_TABLET | Freq: Once | ORAL | Status: AC
  Administered 2015-12-12: 7.5 mg via ORAL
  Filled 2015-12-12: qty 1

## 2015-12-12 NOTE — Progress Notes (Signed)
Occupational Therapy Treatment Patient Details Name: Margaurite Stuller MRN: [id number] DOB: 05-Aug-1945 Today's Date: 12/12/2015    History of present illness 70 y.o. female post op of Treatment of Lt intertrochanteric fracture with intramedullary implant due to a fall in her home. Pt has a history of COPD, chronic systolic CHF, afib, aortic stenosis s/p valve replacement, hyperthyroid, with hx of spontaneous pneumothorax, hypertension and experienced a stroke during her prior cardiac surgery procedures and was left with residual left sided weakness. She was discharged 10/10 after hospitalization for spontaneous pneumothorax related to coughing spells. On 10/11 she was getting out of bed when she tripped on a sheet that was hanging on the floor and fell and broke her hip.    OT comments  Pt making progress towards goals. Benefited from use of Stedy during session today to perform sink level ADL. Pt continues to benefit from skilled OT in the acute care setting prior to d/c to SNF for continued care and therapy. Pt continues to be motivated and wants to work with therapy, but limited by pain. OT will continue to follow.   Follow Up Recommendations  SNF    Equipment Recommendations  Other (comment) (TBD by next venue of care)    Recommendations for Other Services      Precautions / Restrictions Precautions Precautions: Fall Restrictions Weight Bearing Restrictions: Yes RLE Weight Bearing: Weight bearing as tolerated LLE Weight Bearing: Weight bearing as tolerated       Mobility Bed Mobility Overal bed mobility: Needs Assistance;+2 for physical assistance;+ 2 for safety/equipment Bed Mobility: Supine to Sit     Supine to sit: Max assist;+2 for physical assistance;+2 for safety/equipment     General bed mobility comments: Pt remains anxious and fearful to advance to edge of bed.  Required cues for sequencing and hand placement, chux pad used to advance to edge of bed.     Transfers Overall transfer level: Needs assistance Equipment used: None Transfers: Sit to/from Stand (Max VCs for technique and to decrease anxiety.) Sit to Stand: Max assist;+2 physical assistance (remains flexed over stedy frame and fearful of falling.)         General transfer comment: Pt remains anxious so stedy frame used to stand.  Pt required cues for hand placement and assist to boost B hips from bed, with facilitation of hips into extension and trunk into extension.      Balance Overall balance assessment: Needs assistance Sitting-balance support: Feet supported;No upper extremity supported Sitting balance-Leahy Scale: Fair                             ADL Overall ADL's : Needs assistance/impaired     Grooming: Wash/dry hands;Wash/dry face;Oral care;Min guard;Sitting (using steady at sink)                               Functional mobility during ADLs: +2 for physical assistance;+2 for safety/equipment;Maximal assistance (used steady) General ADL Comments: Pt excited about doing sink level ADL in Ste[place].      Vision                     Perception     Praxis      Cognition   Behavior During Therapy: Anxious Overall Cognitive Status: Within Functional Limits for tasks assessed  Extremity/Trunk Assessment               Exercises     Shoulder Instructions       General Comments      Pertinent Vitals/ Pain       Pain Assessment: 0-10 Pain Score: 8  Pain Location: Left hip Pain Descriptors / Indicators: Grimacing;Guarding;[place];Operative site guarding Pain Intervention(s): Monitored during session;Repositioned;Ice applied  Home Living                                          Prior Functioning/Environment              Frequency  Min 2X/week        Progress Toward Goals  OT Goals(current goals can now be found in the care plan section)  Progress  towards OT goals: Progressing toward goals  Acute Rehab OT Goals Patient Stated Goal: Be able to walk and return home OT Goal Formulation: With patient Time For Goal Achievement: 12/18/15 Potential to Achieve Goals: Good  Plan Discharge plan remains appropriate    Co-evaluation    PT/OT/SLP Co-Evaluation/Treatment: Yes Reason for Co-Treatment: Complexity of the patient's impairments (multi-system involvement);For patient/therapist safety PT goals addressed during session: Mobility/safety with mobility OT goals addressed during session: ADL's and self-care      End of Session Equipment Utilized During Treatment: Rolling walker;Oxygen   Activity Tolerance Patient limited by pain;Patient limited by fatigue   Patient Left with nursing/sitter in room;in chair;with call bell/phone within reach   Nurse Communication Other (comment) (in room)        Time: [id number] OT Time Calculation (min): 29 min  Charges: OT General Charges $OT Visit: 1 Procedure OT Treatments $Self Care/Home Management : 8-22 mins  Merri Ray Ceria Suminski 12/12/2015, 4:32 PM  Hulda Humphrey OTR/L [phone]

## 2015-12-12 NOTE — Clinical Social Work Note (Signed)
Clinical Social Work Assessment  Patient Details  Name: Danielle [name] MRN: [id number] Date of Birth: 1945/05/28  Date of referral:  12/12/15               Reason for consult:  Facility Placement                Permission sought to share information with:   (Facilities) Permission granted to share information::   (Facilities)  Name::        Agency::     Relationship::     Contact Information:     Housing/Transportation Living arrangements for the past 2 months:  Single Family Home (Patient states that she lives at home with her husband in [place].) Source of Information:  Patient Patient Interpreter Needed:  None Criminal Activity/Legal Involvement Pertinent to Current Situation/Hospitalization:  No - Comment as needed Significant Relationships:  Spouse Lives with:  Spouse Do you feel safe going back to the place where you live?   (Patient is interested in facilitity.) Need for family participation in patient care:  Yes (Comment)  Care giving concerns:  Patient states that she fell 2 days ago. Patient states that prior to her fall she was able to complete her ADLs independently. However, she now states that she needs assistance with completing her ADLS. Patient states that she is interested in a facility. Patient is accepting to be referred to SNFs.    Social Worker assessment / plan:  SW met with patient at bedside. Patient is alert and oriented. Patient is effectively communicative. There was no family present. Nurse tech was present. Patient states that she presents to hospital after falling two days ago. Patient states that she and her cane became tangled into her bedroom comforter. Patient states that due to this she got a rod put into her R hip. Patient states that she is interested in a facility. SW informed her that she will refer her to facilities. Patient is agreeable at this time.  Employment status:  Retired Forensic scientist:   Education officer, environmental.) PT  Recommendations:    Information / Referral to community resources:   (SNF)  Patient/Family's Response to care:  Patient is appropriate.   Patient/Family's Understanding of and Emotional Response to Diagnosis, Current Treatment, and Prognosis:  Patient is understanding at this time and states that she has no questions for SW.  Emotional Assessment Appearance:  Appears stated age Attitude/Demeanor/Rapport:   (Appropriate) Affect (typically observed):  Accepting Orientation:  Oriented to Self, Oriented to Place, Oriented to  Time, Oriented to Situation Alcohol / Substance use:  Not Applicable Psych involvement (Current and /or in the community):  No (Comment)  Discharge Needs  Concerns to be addressed:  No discharge needs identified Readmission within the last 30 days:    Current discharge risk:  None Barriers to Discharge:  No Barriers Identified   Danielle [name] R 12/12/2015, 3:00 PM

## 2015-12-12 NOTE — [place] FL2 (Signed)
[place] MEDICAID FL2 LEVEL OF CARE SCREENING TOOL     IDENTIFICATION  Patient Name: Danielle [name] Birthdate: 09-Aug-1945 Sex: female Admission Date (Current Location): 12/10/2015  [facility] and [place] Number:  Herbalist and Address:  [facility], [place] [address] [place], [place] [address]      Provider Number: [id number]  Attending Physician Name and Address:  Michel Bickers, MD  Relative Name and Phone Number:       Current Level of Care: Hospital Recommended Level of Care: [place] Prior Approval Number:    Date Approved/Denied:   PASRR Number:  ([id number])  Discharge Plan: SNF    Current Diagnoses: Patient Active Problem List   Diagnosis Date Noted  . Thrombocytopenia ([place]) 12/12/2015  . Anemia 12/12/2015  . Fever 12/12/2015  . Vitamin D deficiency 12/12/2015  . Acute urinary retention 12/11/2015  . Osteoporosis 12/11/2015  . Hip fracture ([place]) 12/10/2015  . Aortic atherosclerosis ([place]) 12/10/2015  . Lung blebs ([place]) 12/03/2015  . Pelvic fracture ([place]) 08/19/2015  . Fall at home 08/19/2015  . Hyperlipidemia 08/19/2015  . Asthma 08/19/2015  . Atrial fibrillation ([place]) 08/19/2015  . Hyperthyroidism 08/19/2015  . Chronic systolic CHF (congestive heart failure) ([place])   . 3-vessel CAD   . Aortic stenosis     Orientation RESPIRATION BLADDER Height & Weight     Self, Time, Situation, Place  O2 Continent Weight: 214 lb (97.1 kg) Height:  5\' 7"  (170.2 cm)  BEHAVIORAL SYMPTOMS/MOOD NEUROLOGICAL BOWEL NUTRITION STATUS      Continent    AMBULATORY STATUS COMMUNICATION OF NEEDS Skin   Extensive Assist Verbally Normal                       Personal Care Assistance Level of Assistance  Bathing, Dressing Bathing Assistance: Limited assistance   Dressing Assistance: Maximum assistance     Functional Limitations Info             SPECIAL CARE FACTORS FREQUENCY                        Contractures      Additional Factors Info  Code Status (DNR)               Current Medications (12/12/2015):  This is the current hospital active medication list Current Facility-Administered Medications  Medication Dose Route Frequency Provider Last Rate Last Dose  . 0.9 %  sodium chloride infusion   Intravenous Continuous Ledell Noss, MD 100 mL/hr at 12/12/15 0951    . acetaminophen (TYLENOL) tablet 650 mg  650 mg Oral Q6H PRN Leandrew Koyanagi, MD   650 mg at 12/12/15 0530   Or  . acetaminophen (TYLENOL) suppository 650 mg  650 mg Rectal Q6H PRN Leandrew Koyanagi, MD      . albuterol (PROVENTIL) (2.5 MG/3ML) 0.083% nebulizer solution 3 mL  3 mL Inhalation Q6H PRN Norman Herrlich, MD      . alum & mag hydroxide-simeth (MAALOX/MYLANTA) 200-200-20 MG/5ML suspension 30 mL  30 mL Oral Q4H PRN Leandrew Koyanagi, MD      . aspirin EC tablet 81 mg  81 mg Oral Daily Naiping Ephriam Jenkins, MD   81 mg at 12/12/15 1141  . atorvastatin (LIPITOR) tablet 40 mg  40 mg Oral QHS Norman Herrlich, MD   40 mg at 12/11/15 2135  . carvedilol (COREG) tablet 25 mg  25 mg  Oral BID WC Norman Herrlich, MD   25 mg at 12/12/15 1142  . cholecalciferol (VITAMIN D) tablet 400 Units  400 Units Oral Daily Ledell Noss, MD      . enoxaparin (LOVENOX) injection 40 mg  40 mg Subcutaneous Q24H Naiping Ephriam Jenkins, MD   40 mg at 12/12/15 1140  . ketorolac (TORADOL) 30 MG/ML injection 30 mg  30 mg Intravenous Q6H PRN Jessica Ratliff Hoffman, DO      . lactated ringers infusion   Intravenous Continuous Leandrew Koyanagi, MD 50 mL/hr at 12/10/15 1448    . menthol-cetylpyridinium (CEPACOL) lozenge 3 mg  1 lozenge Oral PRN Naiping Ephriam Jenkins, MD       Or  . phenol (CHLORASEPTIC) mouth spray 1 spray  1 spray Mouth/Throat PRN Leandrew Koyanagi, MD      . methimazole (TAPAZOLE) tablet 15 mg  15 mg Oral QPM Norman Herrlich, MD   15 mg at 12/11/15 1723  . methocarbamol (ROBAXIN) tablet 500 mg  500 mg Oral Q6H PRN Naiping Ephriam Jenkins, MD       Or  . methocarbamol (ROBAXIN) 500 mg in  dextrose 5 % 50 mL IVPB  500 mg Intravenous Q6H PRN Naiping Ephriam Jenkins, MD      . metoCLOPramide (REGLAN) tablet 5-10 mg  5-10 mg Oral Q8H PRN Naiping Ephriam Jenkins, MD       Or  . metoCLOPramide (REGLAN) injection 5-10 mg  5-10 mg Intravenous Q8H PRN Naiping Ephriam Jenkins, MD      . mometasone-formoterol [facility]) 200-5 MCG/ACT inhaler 2 puff  2 puff Inhalation BID Norman Herrlich, MD   2 puff at 12/12/15 [phone]  . ondansetron (ZOFRAN) tablet 4 mg  4 mg Oral Q6H PRN Naiping Ephriam Jenkins, MD       Or  . ondansetron [facility]) injection 4 mg  4 mg Intravenous Q6H PRN Naiping Ephriam Jenkins, MD      . oxyCODONE (Oxy IR/ROXICODONE) immediate release tablet 5 mg  5 mg Oral Q4H PRN Ledell Noss, MD   5 mg at 12/12/15 1445  . senna-docusate (Senokot-S) tablet 1 tablet  1 tablet Oral BID Leandrew Koyanagi, MD   1 tablet at 12/12/15 1141  . warfarin (COUMADIN) tablet 7.5 mg  7.5 mg Oral ONCE-1800 [place]      . Warfarin - Pharmacist Dosing Inpatient   Does not apply [id number] Michel Bickers, MD         Discharge Medications: Please see discharge summary for a list of discharge medications.  Relevant Imaging Results:  Relevant Lab Results:   Additional Information  (SS: [id number])  Bernita Buffy

## 2015-12-12 NOTE — Progress Notes (Addendum)
ANTICOAGULATION CONSULT NOTE - FOLLOW UP  Pharmacy Consult:  Coumadin Indication:  Afib  Allergies  Allergen Reactions  . Tetanus Toxoid Adsorbed Swelling    Unknown     Patient Measurements: Height: 5\' 7"  (170.2 cm) Weight: 214 lb (97.1 kg) IBW/kg (Calculated) : 61.6  Vital Signs: Temp: 99.8 F (37.7 C) (10/13 0629) Temp Source: Oral (10/13 0629) BP: 123/63 (10/13 0528) Pulse Rate: 104 (10/13 0528)  Labs:  Recent Labs  12/10/15 0843 12/11/15 0424 12/12/15 0651  HGB 11.9* 9.5* 8.2*  HCT 35.4* 29.0* 24.9*  PLT 131* 107* 97*  LABPROT 13.8 15.9* 17.2*  INR 1.06 1.26 1.40  CREATININE 0.97 1.08* 0.91    Estimated Creatinine Clearance: 69.8 mL/min (by C-G formula based on SCr of 0.91 mg/dL).   Assessment: 70 year old female with history of Afib and bio AVR recently discharged from the hospital on 12/09/15 after spontaneous pneumothorax/VATS.  Patient fell and sustained a hip fracture, now s/p repair and to continue on Coumadin and Lovenox.  Coumadin dose was not charted as given on 10/11.  INR remains subtherapeutic at 1.4; no bleeding reported however Hgb has trended down to 8.2, platelets are low at 97 but this is stable since at least 07/2015.  Goal of Therapy:  INR 2-3 Monitor platelets by anticoagulation protocol: Yes   Plan:  - Repeat Coumadin 7.5 mg PO tonight - Continue Lovenox 40mg  SQ Q24H until INR is therapeutic - Daily PT / INR - Monitor for s/sx of bleeding - caution using ketorolac with low Hgb   Renold Genta, PharmD, BCPS Clinical Pharmacist Phone for today - [place] - [phone] 12/12/2015 8:49 AM

## 2015-12-12 NOTE — Progress Notes (Signed)
   Subjective:  Patient reports pain as moderate.  Objective:   VITALS:   Vitals:   12/11/15 2126 12/12/15 0528 12/12/15 0629 12/12/15 1252  BP: (!) 85/47 123/63    Pulse:  (!) 104  99  Resp:  19  18  Temp:  (!) 101.3 F (38.5 C) 99.8 F (37.7 C) 99.7 F (37.6 C)  TempSrc:  Oral Oral Oral  SpO2:  91%  96%  Weight:      Height:        Neurologically intact Neurovascular intact Sensation intact distally Intact pulses distally Dorsiflexion/Plantar flexion intact Incision: dressing C/D/I and no drainage No cellulitis present Compartment soft   Lab Results  Component Value Date   WBC 12.3 (H) 12/12/2015   HGB 8.2 (L) 12/12/2015   HCT 24.9 (L) 12/12/2015   MCV 92.2 12/12/2015   PLT 97 (L) 12/12/2015     Assessment/Plan:  2 Days Post-Op   - stable from ortho stand point - dc to SNF when stable medically   Riyaan Heroux Ephriam Jenkins 12/12/2015, 2:35 PM [phone]

## 2015-12-12 NOTE — Progress Notes (Signed)
Subjective: [name] had two episodes of fever and chills overnight. She is post op day 2 and denies any chest pain, worsening of her baseline shortness of breath, abdominal pain, or uncontrolled pain or swelling in her hip. She also continues to have tachycardia and episodes of hypotension since admission prior to surgery. She denies any blurry vision or weakness out of the ordinary related to this. She says she has never been told that she has low blood pressure in the past.   She asked that we speak with her sister Barbaraann Share who's phone number is [phone] and her husband Nikki Dom through his work phone number [phone], they were updated this afternoon.   Objective:  Vital signs in last 24 hours: Vitals:   12/11/15 2052 12/11/15 2126 12/12/15 0528 12/12/15 0629  BP: (!) 82/45 (!) 85/47 123/63   Pulse: 65  (!) 104   Resp: 20  19   Temp: 99.1 F (37.3 C)  (!) 101.3 F (38.5 C) 99.8 F (37.7 C)  TempSrc: Oral  Oral Oral  SpO2: 90%  91%   Weight:      Height:       Physical Exam  Constitutional: She appears well-developed and well-nourished. No distress.  Cardiovascular: Regular rhythm.  Tachycardia present.   No murmur heard. Pulmonary/Chest: Tachypnea noted. She has no wheezes. She has no rales.  Abdominal: Soft. She exhibits no distension. There is no tenderness.  Skin: Skin is warm and dry.  Extremities: no calf tenderness, no peripheral edema   Medications: Infusions: . sodium chloride 100 mL/hr at 12/12/15 0951  . lactated ringers 50 mL/hr at 12/10/15 1448   Scheduled Medications: . aspirin EC  81 mg Oral Daily  . atorvastatin  40 mg Oral QHS  . carvedilol  25 mg Oral BID WC  . cholecalciferol  400 Units Oral Daily  . enoxaparin (LOVENOX) injection  40 mg Subcutaneous Q24H  . methimazole  15 mg Oral QPM  . mometasone-formoterol  2 puff Inhalation BID  . senna-docusate  1 tablet Oral BID  . warfarin  7.5 mg Oral ONCE-1800  .  Warfarin - Pharmacist Dosing Inpatient   Does not apply q1800   PRN Medications: acetaminophen **OR** acetaminophen, albuterol, alum & mag hydroxide-simeth, ketorolac, menthol-cetylpyridinium **OR** phenol, methocarbamol **OR** methocarbamol (ROBAXIN)  IV, metoCLOPramide **OR** metoCLOPramide (REGLAN) injection, ondansetron **OR** ondansetron (ZOFRAN) IV, oxyCODONE  Assessment/Plan:   Hip fracture (HCC) She is post op day 3 after open reduction and internal fixation. She has been very determined when working with physical therapy. His been using incentive spirometry every hour. Overnight she became hypotensive so her postop protective opiate pain medications were changed to Toradol 30 mg every 6 hours and oxycodone 5 mg immediate release every 4 hours as needed. This morning her blood pressure had improved and her pain was well controlled on this new regiment.     Fever She had episodes of fever overnight and experienced chills without sweats. Today she denies cough or abdominal pain. She had decreased urinary output post-op and was having difficulty moving so urinary catheter was placed and we cannot assess for dysuria. Fever can sometimes be a result of the trauma of surgery but should resolve within 2 days. We will continue to monitor her temperature today, if she continues to have fever we will follow this with urinalysis, urine culture, blood culture, and chest x-ray and start antibiotics pending the findings of this testing.    Fall at home Physical therapy evaluation  she was found to have decreased strength and balance which may be related to her recent falls. Physical therapy and occupational therapy recommended discharge to short-term nursing facility after this hospitalization for her rehabilitation.     Vitamin D deficiency She was found to have a borderline low vitamin D. Corrected calcium 8.8 is normal. We have started Vitamin D supplementation and will continue this at discharge.      COPD  She denies any symptoms of shortness of breath at this time. He is taking sepsis has an increased rate of breathing. She is on 2 L of oxygen and sating at 96%. Optimal oxygen saturation is 88-92% in the setting of COPD. We'll continue managing albuterol nebulizer as needed.    Atrial fibrillation ([place]) She is tachycardic but has a normal rhythm on exam today. He has had some episodes of low blood pressure in the setting of her tachycardia we will continue her home medication carvedilol 25 mg to keep her rate controlled. INR continues to improve, it is 1.4 today     Post operative acute urinary retention  Foley placed 12/11/2015  DVT prophylaxis - Lovenox 40 mg daily    Dispo: Anticipated discharge in approximately 1-2 day(s).   LOS: 2 days   Ledell Noss, MD 12/12/2015, 12:27 PM Pager: [phone]

## 2015-12-12 NOTE — Progress Notes (Signed)
Patient ID: Danielle [name], female   DOB: 04-07-1945, 70 y.o.   MRN: [id number]    Date of Admission:  12/10/2015     I examined [name] with Dr. Hetty Ely this morning. [name] had some fever and hypotension overnight. Her hypotension resolved after stopping her morphine. She had only one fever spike and currently has no clear evidence of infection on exam. Her left hip looks good. She has clear lungs and a normal abdominal exam. She has a Foley catheter. If she has more fever would suggest obtaining UA, urine culture, blood cultures and chest x-ray. If she is febrile and had all unstable and would consider starting empiric vancomycin and cefepime. I agree with keeping her on prophylactic doses of enoxaparin until her INR is therapeutic on warfarin.         Michel Bickers, MD [facility] [place] [facility] [phone] pager   [phone] cell 03/04/2015, 1:32 PM

## 2015-12-12 NOTE — Progress Notes (Addendum)
Physical Therapy Treatment Patient Details Name: Danielle [name] MRN: [id number] DOB: 1945/10/24 Today's Date: 12/12/2015    History of Present Illness 70 y.o. female post op of Treatment of Lt intertrochanteric fracture with intramedullary implant due to a fall in her home. Pt has a history of COPD, chronic systolic CHF, afib, aortic stenosis s/p valve replacement, hyperthyroid, with hx of spontaneous pneumothorax, hypertension and experienced a stroke during her prior cardiac surgery procedures and was left with residual left sided weakness. She was discharged 10/10 after hospitalization for spontaneous pneumothorax related to coughing spells. On 10/11 she was getting out of bed when she tripped on a sheet that was hanging on the floor and fell and broke her hip.     PT Comments    Pt progressed OOB to chair with use of stedy frame.  Remains to require cues for sequencing and encouragement to complete task.  Pt requiring significant assist with mobility and will continue to benefit from rehab at a short term SNF.    Follow Up Recommendations  SNF;Supervision/Assistance - 24 hour     Equipment Recommendations  None recommended by PT    Recommendations for Other Services       Precautions / Restrictions Precautions Precautions: Fall Restrictions Weight Bearing Restrictions: Yes LLE Weight Bearing: Weight bearing as tolerated    Mobility  Bed Mobility Overal bed mobility: Needs Assistance;+2 for physical assistance;+ 2 for safety/equipment       Supine to sit: Max assist;+2 for physical assistance;+2 for safety/equipment     General bed mobility comments: Pt remains anxious and fearful to advance to edge of bed.  Required cues for sequencing and hand placement, chux pad used to advance to edge of bed.    Transfers Overall transfer level: Needs assistance Equipment used: None Transfers: Sit to/from Stand (Max VCs for technique and to decrease anxiety.  ) Sit to  Stand: Max assist;+2 physical assistance (remains flexed over stedy frame and fearful of falling.  )         General transfer comment: Pt remains anxious so stedy frame used to stand.  Pt required cues for hand placement and assist to boost B hips from bed, with facilitation of hips into extension and trunk into extension.    Ambulation/Gait Ambulation/Gait assistance:  (remains unable to stand upright in stedy frame.  )               Stairs            Wheelchair Mobility    Modified Rankin (Stroke Patients Only)       Balance                                    Cognition Arousal/Alertness: Awake/alert Behavior During Therapy: Anxious Overall Cognitive Status: Within Functional Limits for tasks assessed                      Exercises      General Comments        Pertinent Vitals/Pain Pain Assessment: 0-10 Pain Score: 8  Pain Location: L hip  Pain Descriptors / Indicators: Grimacing;Guarding;Operative site guarding Pain Intervention(s): Monitored during session;Repositioned;Ice applied    Home Living                      Prior Function            PT Goals (  current goals can now be found in the care plan section) Acute Rehab PT Goals Patient Stated Goal: Be able to walk and return home Potential to Achieve Goals: Good Progress towards PT goals: Progressing toward goals    Frequency    Min 3X/week      PT Plan Current plan remains appropriate    Co-evaluation PT/OT/SLP Co-Evaluation/Treatment: Yes Reason for Co-Treatment: Complexity of the patient's impairments (multi-system involvement);For patient/therapist safety PT goals addressed during session: Mobility/safety with mobility;Balance OT goals addressed during session: ADL's and self-care     End of Session Equipment Utilized During Treatment: Oxygen Activity Tolerance: Patient limited by fatigue;Patient limited by pain Patient left: in bed;with call  bell/phone within reach;with nursing/sitter in room;with SCD's reapplied     Time: 1231-1258 PT Time Calculation (min) (ACUTE ONLY): 27 min  Charges:   $Therapeutic Activity: 8-22 mins                    G Codes:      Cristela Blue 12/30/15, 1:08 PM  Governor Rooks, PTA pager [phone]

## 2015-12-13 LAB — URINALYSIS, ROUTINE W REFLEX MICROSCOPIC
BILIRUBIN URINE: NEGATIVE
Glucose, UA: NEGATIVE mg/dL
Hgb urine dipstick: NEGATIVE
Ketones, ur: NEGATIVE mg/dL
Leukocytes, UA: NEGATIVE
NITRITE: POSITIVE — AB
PH: 6 (ref 5.0–8.0)
Protein, ur: 30 mg/dL — AB
SPECIFIC GRAVITY, URINE: 1.027 (ref 1.005–1.030)

## 2015-12-13 LAB — CBC
HEMATOCRIT: 25.6 % — AB (ref 36.0–46.0)
Hemoglobin: 8.5 g/dL — ABNORMAL LOW (ref 12.0–15.0)
MCH: 30.5 pg (ref 26.0–34.0)
MCHC: 33.2 g/dL (ref 30.0–36.0)
MCV: 91.8 fL (ref 78.0–100.0)
PLATELETS: 118 10*3/uL — AB (ref 150–400)
RBC: 2.79 MIL/uL — AB (ref 3.87–5.11)
RDW: 13.1 % (ref 11.5–15.5)
WBC: 12.9 10*3/uL — ABNORMAL HIGH (ref 4.0–10.5)

## 2015-12-13 LAB — URINE MICROSCOPIC-ADD ON

## 2015-12-13 LAB — PROTIME-INR
INR: 1.43
Prothrombin Time: 17.5 seconds — ABNORMAL HIGH (ref 11.4–15.2)

## 2015-12-13 MED ORDER — WARFARIN SODIUM 5 MG PO TABS
5.0000 mg | ORAL_TABLET | Freq: Once | ORAL | Status: AC
  Administered 2015-12-13: 5 mg via ORAL
  Filled 2015-12-13: qty 1

## 2015-12-13 MED ORDER — DEXTROSE 5 % IV SOLN
1.0000 g | INTRAVENOUS | Status: AC
  Administered 2015-12-13 – 2015-12-15 (×3): 1 g via INTRAVENOUS
  Filled 2015-12-13 (×4): qty 10

## 2015-12-13 MED ORDER — SULFAMETHOXAZOLE-TRIMETHOPRIM 800-160 MG PO TABS
1.0000 | ORAL_TABLET | Freq: Two times a day (BID) | ORAL | Status: DC
  Administered 2015-12-13: 1 via ORAL
  Filled 2015-12-13: qty 1

## 2015-12-13 NOTE — Progress Notes (Signed)
ANTICOAGULATION CONSULT NOTE - FOLLOW UP  Pharmacy Consult:  Coumadin Indication:  Afib  Allergies  Allergen Reactions  . Tetanus Toxoid Adsorbed Swelling    Unknown     Patient Measurements: Height: 5\' 7"  (170.2 cm) Weight: 214 lb (97.1 kg) IBW/kg (Calculated) : 61.6  Vital Signs: Temp: 98.8 F (37.1 C) (10/14 0500) Temp Source: Oral (10/14 0500) BP: 107/42 (10/14 0500) Pulse Rate: 92 (10/14 0500)  Labs:  Recent Labs  12/11/15 0424 12/12/15 0651 12/13/15 0458  HGB 9.5* 8.2* 8.5*  HCT 29.0* 24.9* 25.6*  PLT 107* 97* 118*  LABPROT 15.9* 17.2* 17.5*  INR 1.26 1.40 1.43  CREATININE 1.08* 0.91  --     Estimated Creatinine Clearance: 69.8 mL/min (by C-G formula based on SCr of 0.91 mg/dL).   Assessment: 70 year old female with history of Afib and bio AVR recently discharged from the hospital on 12/09/15 after spontaneous pneumothorax/VATS.  Patient fell and sustained a hip fracture, now s/p repair and to continue on Coumadin and Lovenox.  Coumadin dose was not charted as given on 10/11.  INR remains subtherapeutic at 1.43; no bleeding reported however Hgb has trended down to 8's, platelets are low at 118 but this is stable since at least 07/2015.  Bactrim DS started this morning for fever likely from UTI which is not a good option with warfarin.  Goal of Therapy:  INR 2-3 Monitor platelets by anticoagulation protocol: Yes   Plan:  - Coumadin 5 mg PO tonight - Continue Lovenox 40mg  SQ Q24H until INR is therapeutic - Daily PT / INR - Monitor for s/sx of bleeding - Spoke with Dr. Lovena Le from [facility] and ok to change to ceftriaxone instead of Bactrim   Renold Genta, PharmD, BCPS Clinical Pharmacist Phone for today - [place] - [phone] 12/13/2015 9:24 AM

## 2015-12-13 NOTE — Progress Notes (Signed)
Subjective:  She developed another fever to 101.4. She was assessed by our night team at that time and was doing well with no complaints. No chills. This morning she has no complaints. She tells me she worked with PT yesterday and had no problems. She was able to sit up in the chair for several hours yesterday. She has been using her incentive spirometry. No shortness of breath. Does have some cough but no sputum production. She has a foley in place.  She requested to call and update her sister Barbaraann Share today. I attempted to call her but was unable to reach her. I left my pager number on voicemail.   Objective:  Vital signs in last 24 hours: Vitals:   12/12/15 1812 12/12/15 2014 12/13/15 0015 12/13/15 0500  BP:  107/62 (!) 95/55 (!) 107/42  Pulse:  99 92 92  Resp:  20 18 16   Temp: 100 F (37.8 C) (!) 101.4 F (38.6 C) 99.8 F (37.7 C) 98.8 F (37.1 C)  TempSrc: Oral Oral Oral Oral  SpO2:  94% 96% 100%  Weight:      Height:       Physical Exam  Constitutional: She appears well-developed and well-nourished. No distress.  Cardiovascular: Regular rhythm.  mild Tachycardia present.   No murmur heard. Pulmonary/Chest: normal work of breath, no tachypnea. She has no wheezes. She has no rales.  Abdominal: Soft. She exhibits no distension. There is no tenderness.  Skin: Skin is warm and dry.  Extremities: no calf tenderness, no peripheral edema. 2+ PT/DP pulses bilaterally. Normal strength in feet bilaterally.   Assessment/Plan:  Hip fracture ([place]) She is post op day 4 after open reduction and internal fixation. She has been very determined when working with physical therapy. His been using incentive spirometry every hour. Pain is well controlled. Well healing incision site but no signs of infection or bleeding. Intake neuro-vasculature on exam today. -Social work for SNF placement  -PT/OT  Fever She had another episode of fever overnight. No systemic symptoms. Does have a  non-productive. CXR mostly unchanged from 10/11. Did note some increased left basilar opacity but likely represents atelectasis. UA positive for nitrites however otherwise unremarkable. She does have foley in place and unable to report any dysuria symptoms. Blood cultures were drawn. It remains unclear what is causing her fevers. Her CXR is not convincing for a PNA although it may be early in the course. Her UA is clear with the exception of nitirites which is ~80% sensitive for infection. I am uncertain why there would not be any accompanies leukocytosis. Her wound looks clean and dry with no signs of infection. Will start Ceftriaxone for presumed UTI and monitor.  -Follow up urine and blood cultures -Ceftriaxone  -D/C foley  Hypotension No further hypotension and has been stable in the upper 90s to low [id number] systolic. Likely secondary to her pain medications. She was receiving morphine and PO opiates that were likely contributing. Pain medications were changed to Toradol 30 mg every 6 hours and oxycodone 5 mg immediate release every 4 hours as needed. This morning her blood pressure had improved and her pain was well controlled on this new regiment.     Osteoporosis Fragility fracture with hip fracture. Started on vitamin D. Will start bisphosphonate therapy at time of discharge.   Vitamin D deficiency She was found to have a borderline low vitamin D. Corrected calcium 8.8 is normal. We have started Vitamin D supplementation and will continue this at discharge.  COPD  She denies any symptoms of shortness of breath at this time. Maintaining O2 sats on room air. We'll continue managing albuterol nebulizer as needed.  Atrial fibrillation ([place]) She is tachycardic but has a normal rhythm on exam today. He has had some episodes of low blood pressure in the setting of her tachycardia we will continue her home medication carvedilol 25 mg to keep her rate controlled. INR continues to improve, it is  1.43 today   Post operative acute urinary retention  Foley placed 12/11/2015 -D/C foley today  DVT prophylaxis - Lovenox 40 mg daily   Dispo: Anticipated discharge in approximately 2 day(s).   Maryellen Pile, MD 12/13/2015, 11:31 AM Pager: [phone]

## 2015-12-13 NOTE — Progress Notes (Signed)
Patient had a fever of 101.4 tonight.  A urinalysis, blood cultures and chest x-ray was ordered.  Urinalysis is positive for nitrites and bacteria.  Chest X-ray showed left basilar opacity, favoring atelectasis.  Other findings were unchanged from previous chest x-ray.  Blood cultures are pending.  I will start the patient on bactrim as her renal function is stable and order a urine culture. I will inform the morning team.

## 2015-12-13 NOTE — Progress Notes (Signed)
Patient ID: Danielle [name], female   DOB: 08/12/1945, 70 y.o.   MRN: [id number] No acute changes.  Left hip stable.  Dressing changed at the bedside and incisions look good.  Awaiting skilled nursing placement.  Vitals stable.

## 2015-12-13 NOTE — Progress Notes (Signed)
Patient has attempted to void after getting onto the bedside commode.  She was not able to void.  Bladder scan showed 225 ml of urine in bladder.  Notified Dr. Reesa Chew, who said to encourage patient to drink fluids and repeat bladder scan in 2 hours.  If bladder scan shows greater than 350ml, then a in and out catheter needs to be completed.  Nsg to monitor.

## 2015-12-13 NOTE — Progress Notes (Signed)
Pt. febrile nd Dr. Charlynn Grimes in to see pt.; Tylenol given.

## 2015-12-14 LAB — PROTIME-INR
INR: 1.73
PROTHROMBIN TIME: 20.5 s — AB (ref 11.4–15.2)

## 2015-12-14 LAB — URINE CULTURE: CULTURE: NO GROWTH

## 2015-12-14 MED ORDER — WARFARIN SODIUM 5 MG PO TABS
5.0000 mg | ORAL_TABLET | Freq: Every evening | ORAL | Status: DC
  Administered 2015-12-14 – 2015-12-15 (×2): 5 mg via ORAL
  Filled 2015-12-14 (×2): qty 1

## 2015-12-14 NOTE — Progress Notes (Signed)
ANTICOAGULATION CONSULT NOTE - FOLLOW UP  Pharmacy Consult:  Coumadin Indication:  Afib  Allergies  Allergen Reactions  . Tetanus Toxoid Adsorbed Swelling    Unknown     Patient Measurements: Height: 5\' 7"  (170.2 cm) Weight: 214 lb (97.1 kg) IBW/kg (Calculated) : 61.6  Vital Signs: Temp: 98.2 F (36.8 C) (10/15 0336) Temp Source: Oral (10/15 0336) BP: 104/61 (10/15 0336) Pulse Rate: 94 (10/15 0336)  Labs:  Recent Labs  12/12/15 0651 12/13/15 0458 12/14/15 0344  HGB 8.2* 8.5*  --   HCT 24.9* 25.6*  --   PLT 97* 118*  --   LABPROT 17.2* 17.5* 20.5*  INR 1.40 1.43 1.73  CREATININE 0.91  --   --     Estimated Creatinine Clearance: 69.8 mL/min (by C-G formula based on SCr of 0.91 mg/dL).   Assessment: 70 year old female with history of Afib and bio AVR recently discharged from the hospital on 12/09/15 after spontaneous pneumothorax/VATS.  Patient fell and sustained a hip fracture, now s/p repair and to continue on Coumadin and Lovenox.  Coumadin dose was not charted as given on 10/11.  INR remains subtherapeutic at 1.73 but is trending up; no bleeding reported however Hgb has trended down to 8's, platelets are low at 118 but this is stable since at least 07/2015.  Goal of Therapy:  INR 2-3 Monitor platelets by anticoagulation protocol: Yes   Plan:  - Coumadin 5 mg PO daily as per home dose - Continue Lovenox 40mg  SQ Q24H until INR is therapeutic - Daily PT / INR - Monitor for s/sx of bleeding  Renold Genta, PharmD, BCPS Clinical Pharmacist Phone for today - [place] - [phone] 12/14/2015 12:46 PM

## 2015-12-14 NOTE — Progress Notes (Signed)
Subjective: [name] was afebrile and denies chills overnight, she has been voiding urine and her pain is under control with current regiment. She has continued to use incentive spirometry and had some cough with lightly blood tinged sputum. She denies shortness of breath.  She requested that we call and speak with her sister Katharine Look [phone] and her husband, they were updated this morning.   Objective:  Vital signs in last 24 hours: Vitals:   12/13/15 1933 12/13/15 2117 12/14/15 0336 12/14/15 0756  BP:  113/66 104/61   Pulse:  100 94   Resp:  20 18   Temp:  99.3 F (37.4 C) 98.2 F (36.8 C)   TempSrc:  Oral Oral   SpO2: 95% 90% 97% 94%  Weight:      Height:       Physical Exam  Constitutional: She appears well-developed and well-nourished. No distress.  Cardiovascular: Regular rhythm.  Tachycardia present.   No murmur heard. Pulmonary/Chest: She has no wheezes.  Slight bibasilar crackles Nasal cannula at 2 liters   Abdominal: Soft. She exhibits no distension. There is no tenderness.  Neurological: She has normal strength.  Extremities:no calf tenderness, no peripheral edema, dressings over left hip surgical site clean dry and intact  Medications: Infusions: . sodium chloride 100 mL/hr at 12/12/15 0951  . lactated ringers 50 mL/hr at 12/10/15 1448   Scheduled Medications: . aspirin EC  81 mg Oral Daily  . atorvastatin  40 mg Oral QHS  . carvedilol  25 mg Oral BID WC  . cefTRIAXone (ROCEPHIN)  IV  1 g Intravenous Q24H  . cholecalciferol  400 Units Oral Daily  . enoxaparin (LOVENOX) injection  40 mg Subcutaneous Q24H  . methimazole  15 mg Oral QPM  . mometasone-formoterol  2 puff Inhalation BID  . senna-docusate  1 tablet Oral BID  . Warfarin - Pharmacist Dosing Inpatient   Does not apply q1800   PRN Medications: acetaminophen **OR** acetaminophen, albuterol, alum & mag hydroxide-simeth, menthol-cetylpyridinium **OR** phenol, methocarbamol  **OR** methocarbamol (ROBAXIN)  IV, metoCLOPramide **OR** metoCLOPramide (REGLAN) injection, ondansetron **OR** ondansetron (ZOFRAN) IV, oxyCODONE  Assessment/Plan: Pt is a 70 y.o. yo female with a PMHx of pelvic fracture, COPD, chronic systolic CHF, afib, aortic stenosis s/p valve replacement, hyperthyroid, spontaneous pneumothorax, hypertension and hyperlipidemia who was admitted on 12/10/2015 with left hip fracture     Hip fracture (HCC) She is postop day 5 after open reduction and internal fixation. She continues to use incentive spirometry regullarly. Pain has been well-controlled. Incision site dressings were changed yesterday and remains clean dry and intact.  - continue PT/OT  - discharge to SNF when a bed becomes available     Fever She remained afebrile overnight. Denies dysuria but has had some coughing which may be related to atelectesis found on CXR. Blood cultures and urine cultures in process. Started on ceftriaxone for presumed UTI after urinalysis had nitrites and few bacteria.  -continue ceftriaxone  -follow up urine and blood cx   COPD  She denies shortness of breath at this time. O2 sat 88-90% on room air. Will continue albuterol nebs PRN.     Atrial fibrillation (HCC) Remains tachycardic with normal rhythm. INR improving.  - Continue carvedilol 25 mg daily  - continue warfarin through pharmacology consult     Osteoporosis   Vitamin D deficiency  Started Vitamin D supplementation. Will start fosamax at discharge     Chronic systolic CHF (congestive heart failure) ([place]) We will continue her  home medication carvedilol 25 mg twice a day. She should be on ACE therapy for this but is hypotensive at this time so we will hold off starting any new therapy.   Dispo: Anticipated discharge in approximately 1-2 day(s).   LOS: 4 days   Ledell Noss, MD 12/14/2015, 7:59 AM Pager: [phone]

## 2015-12-15 DIAGNOSIS — D500 Iron deficiency anemia secondary to blood loss (chronic): Secondary | ICD-10-CM | POA: Diagnosis not present

## 2015-12-15 DIAGNOSIS — E785 Hyperlipidemia, unspecified: Secondary | ICD-10-CM | POA: Diagnosis not present

## 2015-12-15 DIAGNOSIS — S72102D Unspecified trochanteric fracture of left femur, subsequent encounter for closed fracture with routine healing: Secondary | ICD-10-CM | POA: Diagnosis not present

## 2015-12-15 DIAGNOSIS — S72002D Fracture of unspecified part of neck of left femur, subsequent encounter for closed fracture with routine healing: Secondary | ICD-10-CM | POA: Diagnosis not present

## 2015-12-15 DIAGNOSIS — S72001A Fracture of unspecified part of neck of right femur, initial encounter for closed fracture: Secondary | ICD-10-CM | POA: Diagnosis not present

## 2015-12-15 DIAGNOSIS — R2689 Other abnormalities of gait and mobility: Secondary | ICD-10-CM | POA: Diagnosis not present

## 2015-12-15 DIAGNOSIS — E569 Vitamin deficiency, unspecified: Secondary | ICD-10-CM | POA: Diagnosis not present

## 2015-12-15 DIAGNOSIS — R339 Retention of urine, unspecified: Secondary | ICD-10-CM | POA: Diagnosis not present

## 2015-12-15 DIAGNOSIS — M810 Age-related osteoporosis without current pathological fracture: Secondary | ICD-10-CM | POA: Diagnosis not present

## 2015-12-15 DIAGNOSIS — Y92009 Unspecified place in unspecified non-institutional (private) residence as the place of occurrence of the external cause: Secondary | ICD-10-CM

## 2015-12-15 DIAGNOSIS — E039 Hypothyroidism, unspecified: Secondary | ICD-10-CM | POA: Diagnosis not present

## 2015-12-15 DIAGNOSIS — M25552 Pain in left hip: Secondary | ICD-10-CM | POA: Diagnosis not present

## 2015-12-15 DIAGNOSIS — I480 Paroxysmal atrial fibrillation: Secondary | ICD-10-CM | POA: Diagnosis not present

## 2015-12-15 DIAGNOSIS — W19XXXD Unspecified fall, subsequent encounter: Secondary | ICD-10-CM | POA: Diagnosis not present

## 2015-12-15 DIAGNOSIS — M6281 Muscle weakness (generalized): Secondary | ICD-10-CM | POA: Diagnosis not present

## 2015-12-15 DIAGNOSIS — R918 Other nonspecific abnormal finding of lung field: Secondary | ICD-10-CM | POA: Diagnosis not present

## 2015-12-15 DIAGNOSIS — R531 Weakness: Secondary | ICD-10-CM | POA: Diagnosis not present

## 2015-12-15 DIAGNOSIS — E0590 Thyrotoxicosis, unspecified without thyrotoxic crisis or storm: Secondary | ICD-10-CM | POA: Diagnosis not present

## 2015-12-15 DIAGNOSIS — I2510 Atherosclerotic heart disease of native coronary artery without angina pectoris: Secondary | ICD-10-CM | POA: Diagnosis not present

## 2015-12-15 DIAGNOSIS — I4891 Unspecified atrial fibrillation: Secondary | ICD-10-CM | POA: Diagnosis not present

## 2015-12-15 DIAGNOSIS — S329XXA Fracture of unspecified parts of lumbosacral spine and pelvis, initial encounter for closed fracture: Secondary | ICD-10-CM | POA: Diagnosis not present

## 2015-12-15 DIAGNOSIS — J449 Chronic obstructive pulmonary disease, unspecified: Secondary | ICD-10-CM | POA: Diagnosis not present

## 2015-12-15 DIAGNOSIS — Z5181 Encounter for therapeutic drug level monitoring: Secondary | ICD-10-CM | POA: Diagnosis not present

## 2015-12-15 DIAGNOSIS — I509 Heart failure, unspecified: Secondary | ICD-10-CM | POA: Diagnosis not present

## 2015-12-15 LAB — PROTIME-INR
INR: 2.19
Prothrombin Time: 24.7 seconds — ABNORMAL HIGH (ref 11.4–15.2)

## 2015-12-15 MED ORDER — NITROFURANTOIN MONOHYD MACRO 100 MG PO CAPS: 100.0000 mg | ORAL_CAPSULE | Freq: Two times a day (BID) | ORAL | Status: DC

## 2015-12-15 MED ORDER — VITAMIN D3 10 MCG (400 UNIT) PO TABS: 400.0000 [IU] | ORAL_TABLET | Freq: Every day | ORAL | 0 refills | Status: DC

## 2015-12-15 MED ORDER — ALENDRONATE SODIUM 70 MG PO TABS: 70.0000 mg | ORAL_TABLET | ORAL | 0 refills | Status: DC

## 2015-12-15 MED ORDER — WARFARIN SODIUM 5 MG PO TABS: 5.0000 mg | ORAL_TABLET | Freq: Every evening | ORAL | 0 refills | Status: DC

## 2015-12-15 MED ORDER — ALENDRONATE SODIUM 10 MG PO TABS: 70.0000 mg | ORAL_TABLET | ORAL | Status: DC

## 2015-12-15 MED ORDER — CEPHALEXIN 500 MG PO CAPS: 500.0000 mg | ORAL_CAPSULE | Freq: Two times a day (BID) | ORAL | 0 refills | Status: DC

## 2015-12-15 MED ORDER — OXYCODONE HCL 5 MG PO TABS: 5.0000 mg | ORAL_TABLET | ORAL | 0 refills | Status: DC | PRN

## 2015-12-15 MED ORDER — METHOCARBAMOL 500 MG PO TABS: 500.0000 mg | ORAL_TABLET | Freq: Four times a day (QID) | ORAL | 0 refills | Status: DC | PRN

## 2015-12-15 NOTE — Progress Notes (Signed)
Physical Therapy Treatment Patient Details Name: Danielle [name] MRN: [id number] DOB: [birth year] Today's Date: 12/15/2015    History of Present Illness 70 y.o. female post op of Treatment of Lt intertrochanteric fracture with intramedullary implant due to a fall in her home. Pt has a history of COPD, chronic systolic CHF, afib, aortic stenosis s/p valve replacement, hyperthyroid, with hx of spontaneous pneumothorax, hypertension and experienced a stroke during her prior cardiac surgery procedures and was left with residual left sided weakness. She was discharged 10/10 after hospitalization for spontaneous pneumothorax related to coughing spells. On 10/11 she was getting out of bed when she tripped on a sheet that was hanging on the floor and fell and broke her hip.     PT Comments    Pt required assist from PTA to assist nurse tech in back to bed transfer of patient.  Pt able to perform squat pivot without stedy.  Pt should d/c to SNF this pm.    Follow Up Recommendations  SNF;Supervision/Assistance - 24 hour     Equipment Recommendations  None recommended by PT    Recommendations for Other Services       Precautions / Restrictions Precautions Precautions: Fall Restrictions Weight Bearing Restrictions: Yes LLE Weight Bearing: Weight bearing as tolerated    Mobility  Bed Mobility Overal bed mobility: Needs Assistance;+2 for physical assistance Bed Mobility: Sit to Supine       Sit to supine: Mod assist;+2 for physical assistance   General bed mobility comments: Pt required assist with B LEs and trunk to transition from sitting to supine.  Pt anxious but requiring decreased assist from previous session.    Transfers Overall transfer level: Needs assistance Equipment used: Hemi-walker Transfers: Squat Pivot Transfers     Squat pivot transfers: Mod assist;+2 physical assistance     General transfer comment: Cues for hand placement.  pt stood partially and  able to shuffle steps to turns and sit.  pt required assist to boost from chair to pivot to bed.  Assisted nruse tech with back to bed transfer.    Ambulation/Gait                 Stairs            Wheelchair Mobility    Modified Rankin (Stroke Patients Only)       Balance Overall balance assessment: Needs assistance   Sitting balance-Leahy Scale: Fair       Standing balance-Leahy Scale: Poor                      Cognition Arousal/Alertness: Awake/alert Behavior During Therapy: Anxious;WFL for tasks assessed/performed Overall Cognitive Status: Within Functional Limits for tasks assessed                      Exercises      General Comments        Pertinent Vitals/Pain Pain Assessment: 0-10 Pain Score: 8  Pain Location: L hip  Pain Descriptors / Indicators: Operative site guarding;Grimacing;Guarding Pain Intervention(s): Monitored during session;Repositioned    Home Living                      Prior Function            PT Goals (current goals can now be found in the care plan section) Acute Rehab PT Goals Patient Stated Goal: Be able to walk and return home Potential to Achieve Goals: Good Progress towards PT  goals: Progressing toward goals    Frequency    Min 3X/week      PT Plan Current plan remains appropriate    Co-evaluation             End of Session Equipment Utilized During Treatment: Gait belt Activity Tolerance: Patient limited by pain;Patient tolerated treatment well Patient left: in bed;with call bell/phone within reach;with nursing/sitter in room;with SCD's reapplied     Time: 1256-1316 PT Time Calculation (min) (ACUTE ONLY): 20 min  Charges:  $Therapeutic Activity: 8-22 mins                    G Codes:      Cristela Blue December 27, 2015, 2:41 PM  Governor Rooks, PTA pager [phone]

## 2015-12-15 NOTE — Progress Notes (Signed)
Physical Therapy Treatment Patient Details Name: Danielle [name] MRN: [id number] DOB: May 13, 1945 Today's Date: 12/15/2015    History of Present Illness 70 y.o. female post op of Treatment of Lt intertrochanteric fracture with intramedullary implant due to a fall in her home. Pt has a history of COPD, chronic systolic CHF, afib, aortic stenosis s/p valve replacement, hyperthyroid, with hx of spontaneous pneumothorax, hypertension and experienced a stroke during her prior cardiac surgery procedures and was left with residual left sided weakness. She was discharged 10/10 after hospitalization for spontaneous pneumothorax related to coughing spells. On 10/11 she was getting out of bed when she tripped on a sheet that was hanging on the floor and fell and broke her hip.     PT Comments    Pt performed multiple sit to stand transfers in stedy frame but remains to lack extension to progress to standing with RW.  Pt will required continued rehab to improve strength and functional mobility before returning home.  Pt continues to make slow progress.     Follow Up Recommendations  SNF;Supervision/Assistance - 24 hour     Equipment Recommendations  None recommended by PT    Recommendations for Other Services       Precautions / Restrictions Precautions Precautions: Fall Restrictions Weight Bearing Restrictions: Yes RLE Weight Bearing: Weight bearing as tolerated LLE Weight Bearing: Weight bearing as tolerated    Mobility  Bed Mobility Overal bed mobility: Needs Assistance;+2 for physical assistance Bed Mobility: Supine to Sit     Supine to sit: Mod assist;+2 for physical assistance     General bed mobility comments: Pt remains anxious but less anxious than previous session.  Pt required assist to advance B LEs  to edge of bed then +2 assist for patient to pull on PTA and RN as railings.    Transfers Overall transfer level: Needs assistance Equipment used:  None Transfers: Sit to/from Stand Sit to Stand: Mod assist;Max assist;+2 physical assistance         General transfer comment: +2 mod from elevated pad x1, stedy frame plates x2, and max assist +2 from [facility].  REquired increased assist from lower seated surface.  Cues for hand placement, forward weight shifting and pushing with B LEs.    Ambulation/Gait Ambulation/Gait assistance:  (remains unable, during standing patient remains flexed at the hips, with decreased extension of trunk and hips.  )               Stairs            Wheelchair Mobility    Modified Rankin (Stroke Patients Only)       Balance Overall balance assessment: Needs assistance   Sitting balance-Leahy Scale: Fair       Standing balance-Leahy Scale: Poor                      Cognition Arousal/Alertness: Awake/alert Behavior During Therapy: Anxious Overall Cognitive Status: Within Functional Limits for tasks assessed                      Exercises Total Joint Exercises Ankle Circles/Pumps: AROM;Both;20 reps;Supine Quad Sets: AROM;Left;20 reps;Supine Heel Slides: AAROM;Left;10 reps;Supine Hip ABduction/ADduction: AAROM;Left;10 reps;Supine    General Comments        Pertinent Vitals/Pain Pain Assessment: 0-10 Pain Score: 8  Pain Location: L hip  Pain Descriptors / Indicators: Operative site guarding;Grimacing;Guarding;Sore Pain Intervention(s): Monitored during session;Repositioned    Home Living  Prior Function            PT Goals (current goals can now be found in the care plan section) Acute Rehab PT Goals Patient Stated Goal: Be able to walk and return home Potential to Achieve Goals: Good Progress towards PT goals: Progressing toward goals    Frequency    Min 3X/week      PT Plan Current plan remains appropriate    Co-evaluation             End of Session   Activity Tolerance: Patient limited by  fatigue;Patient limited by pain Patient left: in bed;with call bell/phone within reach;with nursing/sitter in room;with SCD's reapplied     Time: [id number] PT Time Calculation (min) (ACUTE ONLY): 26 min  Charges:  $Therapeutic Activity: 23-37 mins                    G Codes:      Cristela Blue 2016/01/06, 10:33 AM  Governor Rooks, PTA pager [phone]

## 2015-12-15 NOTE — Progress Notes (Signed)
ANTICOAGULATION CONSULT NOTE - FOLLOW UP  Pharmacy Consult:  Coumadin Indication:  Afib  Allergies  Allergen Reactions  . Tetanus Toxoid Adsorbed Swelling    Unknown     Patient Measurements: Height: 5\' 7"  (170.2 cm) Weight: 214 lb (97.1 kg) IBW/kg (Calculated) : 61.6  Vital Signs: Temp: 98.7 F (37.1 C) (10/16 0628) Temp Source: Oral (10/16 0628) BP: 132/57 (10/16 0835) Pulse Rate: 120 (10/16 0835)  Labs:  Recent Labs  12/13/15 0458 12/14/15 0344 12/15/15 0349  HGB 8.5*  --   --   HCT 25.6*  --   --   PLT 118*  --   --   LABPROT 17.5* 20.5* 24.7*  INR 1.43 1.73 2.19    Estimated Creatinine Clearance: 69.8 mL/min (by C-G formula based on SCr of 0.91 mg/dL).   Assessment: 70 year old female with history of Afib and bio AVR recently discharged from the hospital on 12/09/15 after spontaneous pneumothorax/VATS.  Patient fell and sustained a hip fracture, now s/p repair and to continue on Coumadin and Lovenox.  Coumadin dose was not charted as given on 10/11.  INR is therapeutic at 2.19; no bleeding reported, no new CBC.  Goal of Therapy:  INR 2-3 Monitor platelets by anticoagulation protocol: Yes   Plan:  - Coumadin 5 mg PO daily - Daily PT / INR - Monitor for s/sx of bleeding  Renold Genta, PharmD, BCPS Clinical Pharmacist Phone for today - [place] - [phone] 12/15/2015 11:16 AM

## 2015-12-15 NOTE — Discharge Summary (Signed)
Name: Danielle [name] MRN: [id number] DOB: 1945-12-22 70 y.o. PCP: Glendale Chard, MD  Date of Admission: 12/10/2015  7:12 AM Date of Discharge: 12/15/2015 Attending Physician: Michel Bickers, MD  Discharge Diagnosis: Principal Problem:   Hip fracture [facility]) Active Problems:   Fall at home   Hyperlipidemia   Asthma   Atrial fibrillation ([place])   Chronic systolic CHF (congestive heart failure) ([place])   3-vessel CAD   Hyperthyroidism   Lung blebs ([place])   Aortic atherosclerosis ([place])   Acute urinary retention   Osteoporosis   Thrombocytopenia (HCC)   Anemia   Fever   Vitamin D deficiency   Discharge Medications:   Medication List    TAKE these medications   alendronate 70 MG tablet Commonly known as:  FOSAMAX Take 1 tablet (70 mg total) by mouth once a week. Take with a full glass of water on an empty stomach.   aspirin EC 81 MG tablet Take 81 mg by mouth daily.   carvedilol 6.25 MG tablet Commonly known as:  COREG Take 1 tablet (6.25 mg total) by mouth 2 (two) times daily with a meal.   cephALEXin 500 MG capsule Commonly known as:  KEFLEX Take 1 capsule (500 mg total) by mouth 2 (two) times daily. Stop date 10/18   HYDROcodone-acetaminophen 10-325 MG tablet Commonly known as:  NORCO Take 1-2 tablets by mouth every 6 (six) hours as needed for moderate pain or severe pain.   LIPITOR 40 MG tablet Generic drug:  atorvastatin Take 40 mg by mouth at bedtime.   methimazole 5 MG tablet Commonly known as:  TAPAZOLE Take 15 mg by mouth every evening.   methocarbamol 500 MG tablet Commonly known as:  ROBAXIN Take 1 tablet (500 mg total) by mouth every 6 (six) hours as needed for muscle spasms.   oxyCODONE 5 MG immediate release tablet Commonly known as:  Oxy IR/ROXICODONE Take 1 tablet (5 mg total) by mouth every 4 (four) hours as needed for breakthrough pain ((for MODERATE breakthrough pain)). What changed:  how much to take  when to take  this  reasons to take this   PROAIR HFA 108 (90 Base) MCG/ACT inhaler Generic drug:  albuterol Inhale 2 puffs into the lungs every 6 (six) hours as needed for wheezing or shortness of breath.   senna-docusate 8.6-50 MG tablet Commonly known as:  Senokot-S Take 1 tablet by mouth 2 (two) times daily.   SYMBICORT 160-4.5 MCG/ACT inhaler Generic drug:  budesonide-formoterol Inhale 2 puffs into the lungs 2 (two) times daily.   Vitamin D3 400 units tablet Take 1 tablet (400 Units total) by mouth daily. Start taking on:  12/16/2015   warfarin 5 MG tablet Commonly known as:  COUMADIN Take 1 tablet (5 mg total) by mouth daily at 6 PM. What changed:  Another medication with the same name was added. Make sure you understand how and when to take each.   warfarin 5 MG tablet Commonly known as:  COUMADIN Take 1 tablet (5 mg total) by mouth every evening. What changed:  You were already taking a medication with the same name, and this prescription was added. Make sure you understand how and when to take each.   ZANTAC 150 MG tablet Generic drug:  ranitidine Take 150 mg by mouth 2 (two) times daily.       Disposition and follow-up:   Danielle.Danielle [name] was discharged from [facility] in Stable condition.  At the hospital follow up visit please address:  1.  Hip fracture- Please schedule a follow up with orthopedic surgery and continue physical therapy  paroxysmal A. Fib- Coumadin 5 mg daily with daily PT/INR checks.  2.  Labs / imaging needed at time of follow-up: CBC  3.  Pending labs/ test needing follow-up: blood cultures (no growth for 2 days 12/15/2015)   Follow-up Appointments: Follow-up Information    Naiping Ephriam Jenkins, MD Follow up in 2 week(s).   Specialty:  Orthopedic Surgery Why:  For suture removal, For wound re-check Contact information: [place] [id number] [phone]           Hospital Course by problem  list:    Hip fracture [facility]) Danielle [name] presented after a mechanical fall at home resulted in left hip fracture. She had a non complicated surgical open reduction and internal fixation. Postop she was very motivated when working with physical therapy. Given her history of multiple hip fracture secondary to mechanical fall, physical therapy and occupational therapy evaluated her and believe she would benefit from a short-term stay at a skilled nursing facility. On the day of discharge she was able get out of bed and transfer to bedside chair. She will need to be scheduled for follow up with orthopedic surgery for examination and staple removal in 2 weeks.    Fever She did develop a fever 2 days postoperative. Chest x-ray showed atelectasis and urinalysis had nitrates and few bacteria. Urine cultures had no growth and blood cultures had no growth for 2 days.She completed 3 days of ceftriaxone. She will be transitioned to keflex BID for an additional 2 days of coverage with stop date 10/18.     Paroxysmal Atrial fibrillation ([place]) On presentation she was in sinus tachycardia, this continued throughout her hospitalization. Her home medication for rate control is carvedilol 25 mg twice daily. On admission she had a subtherapeutic INR, the home dose of warfarin 5 mg was started along with lovenox 40 mg sub q daily for bridge to theraputic INR in the setting of her recent hip surgery and need for DVT ppx. She was monitored with daily INRs. On the day of discharge she had an INR of 2.19. After discharge should continue warfarin 5 mg daily with daily INR checks.    Chronic systolic CHF (congestive heart failure) ([place]) ECHO January 2017 showed an EF 30-35% and moderate global hypokinesia of the left ventricle. On presentation she reported that she has orthopnea at baseline but described no recent worsening of this, dyspnea on exertion, or edema. Her home medications include lisinopril 5 mg daily and  carvedilol 25 mg BID. She remained tachycardic with low normal blood pressure so carvedilol was continued and lisinopril was held.    Osteoporosis   Vitamin D deficiency She was found to have low vitamin D and was started on vitamin D supplementation. She said start taking Fosamax 70 mg daily after discharge.  COPD    Lung blebs ([place]) She was hospitalized prior to this presentation for spontaneous pneumothorax secondary to lung blebs. Her home medications include symbicort inhaler which she takes twice daily and albuterol inhaler as needed for wheeze or shortness of breath. She was managed with an albuterol nebulizer during this hospitalization and at times used 2 liters of nasal canula to maintain SpO2 88-92%.     Hyperlipidemia Her home medication atorvastatin 40 mg daily at bedtime was continued.     Hyperthyroidism Her home medication methimazole 15 mg daily was continued.   Discharge Vitals:  BP (P) 128/79   Pulse (P) 95   Temp (!) (P) 100.7 F (38.2 C)   Resp 16   Ht 5\' 7"  (1.702 m)   Wt 214 lb (97.1 kg)   SpO2 (P) 92%   BMI 33.52 kg/m   Procedures Performed:  Dg Chest 1 View  Result Date: 12/10/2015 CLINICAL DATA:  Pain following fall EXAM: CHEST 1 VIEW COMPARISON:  December 09, 2015 FINDINGS: There has been resolution of fluid from the recent apical hydropneumothorax on the left. Small residual pneumothorax remains in the left apex. There is postoperative change in both upper lobe regions. Scattered areas of scarring remain. There is a small left pleural effusion, persistent. Interstitium remains prominent but stable. There is no frank edema or consolidation. There is cardiomegaly with mild pulmonary venous hypertension, stable. Subcutaneous emphysema remains on the left. Central catheter is no longer apparent. IMPRESSION: Central catheter no longer apparent. The previously noted fluid in a left apical hydro pneumothorax has resolved. Small residual left apical pneumothorax is  felt to be present. No tension component. Small left pleural effusion remains. There is pulmonary venous congestion without frank edema or consolidation. Somewhat nodular interstitial prominence remains. Stable cardiac silhouette. Stable postoperative change in both upper lobes. Electronically Signed   By: Lowella Grip III M.D.   On: 12/10/2015 08:47   Dg Chest 2 View  Result Date: 12/12/2015 CLINICAL DATA:  Fever today. Two days postop of left hip fracture fixation. EXAM: CHEST  2 VIEW COMPARISON:  Chest radiograph 12/10/2015, chest CT 12/01/2015 FINDINGS: Left apical hydro pneumothorax is unchanged on prior exam with fluid level noted. Decreasing chest wall emphysema on the left. Unchanged left basilar pleural fluid. There is increasing left basilar opacity. Surgical change in the right lung with multiple sutures. No new right lung abnormality. Unchanged mediastinal contours. IMPRESSION: Left apical hydro pneumothorax is grossly unchanged. Left basilar pleural fluid is also unchanged. Increasing left basilar opacity, favoring atelectasis. Electronically Signed   By: Jeb Levering M.D.   On: 12/12/2015 22:43   Dg Chest 2 View  Result Date: 12/09/2015 CLINICAL DATA:  History pneumothorax. Persistent shortness of breath. EXAM: CHEST  2 VIEW COMPARISON:  10/9/ 2017; 12/07/2015 ; 12/05/2015; 11/30/2015; 11/28/2015; chest CT - 12/01/2015 FINDINGS: Grossly unchanged cardiac silhouette and mediastinal contours post median sternotomy and valve replacement. Stable positioning of support apparatus. Grossly unchanged loculated left apical hydro pneumothorax with associated small layering left-sided pleural effusion. Stable postsurgical change of the right mid and upper lung. Grossly unchanged diffuse slightly nodular thickening of the pulmonary interstitium. Pulmonary venous congestion without frank evidence of edema. Unchanged bones. The amount of left lateral chest wall subcutaneous emphysema is grossly  unchanged. IMPRESSION: 1. Grossly unchanged loculated left apical hydro pneumothorax without interval change. 2. Grossly unchanged small layering left-sided effusion. Electronically Signed   By: Sandi Mariscal M.D.   On: 12/09/2015 09:06   Dg Chest 2 View  Result Date: 12/08/2015 CLINICAL DATA:  Removal of chest tube yesterday, chest soreness. History of left-sided pneumothorax. EXAM: CHEST  2 VIEW COMPARISON:  Portable chest x-ray of December 07, 2015 FINDINGS: The lungs are hypoinflated. There is an air-fluid level posteriorly and superiorly in the upper left pleural space consistent with a hydro pneumothorax. The interstitial markings of both lungs remain increased. The left-sided chest tube has been removed. The heart is mildly enlarged but stable. The central pulmonary vascularity is prominent but less engorged today. The patient has undergone previous median sternotomy. There is no pleural effusion.  The left subclavian venous catheter tip projects at the junction of the right and left brachiocephalic veins. There are loops of mildly distended bowel in the upper abdomen. IMPRESSION: Hydropneumothorax on the left amounting to approximately 10% of the lung volume. CHF with mild pulmonary vascular congestion, stable. These results were called by telephone at the time of interpretation on 12/08/2015 at 7:35 am to [facility], RN, who verbally acknowledged these results. Electronically Signed   By: David  [name] M.D.   On: 12/08/2015 07:32   Dg Chest 2 View  Addendum Date: 11/28/2015   ADDENDUM REPORT: 11/28/2015 14:09 ADDENDUM: These results were discussed by telephone on 11/28/2015 at 2:08 pm with Danielle Laurance Flatten, NP, who verbally acknowledged the results. Signed, Dulcy Fanny. Earleen Newport, DO [facility] Electronically Signed   By: Corrie Mckusick D.O.   On: 11/28/2015 14:09   Result Date: 11/28/2015 CLINICAL DATA:  70 year old female with shortness of breath and  wheezing. EXAM: CHEST  2 VIEW COMPARISON:  None. FINDINGS: Large left pneumothorax with mild deviation of the trachea from left to right. Using Collins method  % = 4.2 + 4.7 * (A+B+C) where A =5.7cm, B =7.4cm, C = 6.5cm  percentage is estimated 96%. Interstitial opacities of the right lung with surgical staple line of the lateral right lung. Surgical changes of prior median sternotomy and aortic valve repair. No pleural effusion. No displaced fracture.  Degenerative changes of the spine. IMPRESSION: Large left pneumothorax with evidence of early tension. Surgical changes of median sternotomy and aortic valve replacement. The patient is currently being sent to the emergency department from outpatient imaging center. Attempts to contact the referring physician by phone, with message left with the answering service during closed office hours. Awaiting return phone call, at which time addendum can be made. Signed, Dulcy Fanny. Earleen Newport, DO [facility] Electronically Signed: By: Corrie Mckusick D.O. On: 11/28/2015 13:37   Ct Chest Wo Contrast  Result Date: 12/01/2015 CLINICAL DATA:  Followup pneumothorax. EXAM: CT CHEST WITHOUT CONTRAST TECHNIQUE: Multidetector CT imaging of the chest was performed following the standard protocol without IV contrast. COMPARISON:  Chest x-ray 11/30/2015 FINDINGS: Chest wall: No breast masses, supraclavicular or axillary lymphadenopathy. Small scattered lymph nodes are noted. Moderate subcutaneous emphysema on the left side. Surgical changes from bypass surgery. Multinodular thyroid goiter noted, left greater than right. Deviation of the trachea right rib is noted. Cardiovascular: The heart is normal in size. No pericardial effusion. Surgical changes from coronary artery bypass surgery and aortic valve replacement. Scattered aortic calcifications. Mild tortuosity. Three-vessel coronary artery calcifications are noted.  Mediastinum/Nodes: Small scattered mediastinal and hilar lymph nodes. No mass or overt adenopathy. The esophagus is grossly normal. Lungs/Pleura: Moderate-sized left-sided pneumothorax estimated at 15-20%. The chest tube is in good position. Advanced bullous emphysema, left greater than right. Surgical changes noted at the right lung apex. Bibasilar atelectasis, left greater than right. Upper Abdomen: Numerous small layering gallstones in the gallbladder. Simple hepatic cysts. No upper abdominal mass or adenopathy. Musculoskeletal: No significant bony findings. IMPRESSION: 1. 15-20% left-sided pneumothorax despite the chest tube. 2. Advanced bullous emphysema, left greater than right. 3. Surgical scarring changes noted at the right lung apex. 4. Bibasilar atelectasis, left greater than right. 5. Surgical changes from bypass surgery and aortic valve replacement. 6. Cholelithiasis. Electronically Signed   By: Marijo Sanes M.D.   On: 12/01/2015 16:26   Dg Chest Port 1 View  Result Date: 12/07/2015 CLINICAL  DATA:  Chest tube in place.  Recent pneumothorax EXAM: PORTABLE CHEST 1 VIEW COMPARISON:  December 06, 2015 FINDINGS: Chest tube on the left remains in plaques. Pneumothorax on the left is felt to remain without significant change. There is soft tissue air on the left. There is mild atelectasis in the left mid lung. Lungs elsewhere clear. There is postoperative change in the right apex region. Heart size and pulmonary vascularity are normal. No adenopathy. There is atherosclerotic calcification in the aortic arch region. Central catheter tip is at the junction of the left innominate vein and superior vena cava. IMPRESSION: Stable tubes and catheters without progression of pneumothorax on the left. There is felt to be a degree of pneumothorax in the left apex, likely with some expanded lung overlying the apparent pneumothorax on frontal view. There is soft tissue air on the left laterally, stable. Atelectasis left  mid lung is stable. Postoperative change right apex, stable. No new opacity. Stable cardiac silhouette. There is aortic atherosclerosis. Electronically Signed   By: Lowella Grip III M.D.   On: 12/07/2015 11:55   Dg Chest Port 1 View  Result Date: 12/06/2015 CLINICAL DATA:  Shortness of Breath EXAM: PORTABLE CHEST 1 VIEW COMPARISON:  December 05, 2015 FINDINGS: Chest tube remains on the left. Central catheter tip is in the superior vena cava just beyond the junction with the left innominate vein. There is a left apical pneumothorax which may be marginally smaller than on study from 1 day prior. There is subcutaneous air on the left which appears essentially stable. There is postoperative change in the right apex. There is patchy atelectasis throughout the left lung, stable. No new opacity is evident. Heart is upper normal in size with pulmonary vascularity within normal limits. There is atherosclerotic calcification in the aorta. No adenopathy evident. No bone lesions. IMPRESSION: Chest tube remains on the left with small left apical pneumothorax, slightly smaller compared to 1 day prior. Patchy atelectasis remains on the left. Postoperative change on the right near the apex. Stable cardiac silhouette. No new parenchymal lung opacity. Electronically Signed   By: Lowella Grip III M.D.   On: 12/06/2015 07:52   Dg Chest Port 1 View  Result Date: 12/05/2015 CLINICAL DATA:  Left pneumothorax and chest tube.  Follow-up. EXAM: PORTABLE CHEST 1 VIEW COMPARISON:  Earlier same day FINDINGS: Left subclavian central line has its tip in the SVC at the azygos level. Left chest tube remains in place. Left apical pneumothorax is unchanged since the immediate previous film. Pneumothorax estimated at 20%. Left base volume loss slightly increased. IMPRESSION: Persistent and unchanged left pneumothorax, estimated at 20%. Electronically Signed   By: Nelson Chimes M.D.   On: 12/05/2015 13:26   Dg Chest Port 1  View  Result Date: 12/05/2015 CLINICAL DATA:  Follow-up left pneumothorax EXAM: PORTABLE CHEST 1 VIEW COMPARISON:  Chest radiograph from one day prior. FINDINGS: Interval removal of 1 of the left chest tubes. Remaining left chest tube terminates in the mid to upper left pleural space. Left subclavian central venous catheter terminates in the upper third of the superior vena cava. Sternotomy wires appear aligned and intact. Surgical sutures overlie the mid to upper lungs bilaterally. Stable cardiomediastinal silhouette with normal heart size. No right pneumothorax. There is a new curvilinear contour overlying the left upper lung, favored to represent an increased small left apical pneumothorax despite the presence of lung markings extending peripheral to this contour. No pleural effusion. Patchy opacity in the left lung is  stable, favor atelectasis. No new focal airspace opacity. Stable subcutaneous emphysema in the left lateral chest wall. IMPRESSION: 1. Small left apical pneumothorax appears increased. No change in position of the mediastinum. Recommend attention on short-term follow-up chest radiograph. 2. Stable patchy left lung opacity, favor atelectasis. Critical Value/emergent results were called by telephone at the time of interpretation on 12/05/2015 at 9:10 am to [place], who verbally acknowledged these results. Electronically Signed   By: Ilona Sorrel M.D.   On: 12/05/2015 09:11   Dg Chest Port 1 View  Result Date: 12/04/2015 CLINICAL DATA:  Shortness of breath and chest pain EXAM: PORTABLE CHEST 1 VIEW COMPARISON:  12/03/2015 FINDINGS: Two chest tubes are again identified on the left. Tiny left apical pneumothorax remains. A left subclavian central line is again noted and stable in the proximal superior vena cava. Scattered density remains in the left lung consistent with atelectatic change. Mild right basilar atelectasis is seen. No acute bony abnormality is noted. Postsurgical changes  are again seen. IMPRESSION: Tiny stable left apical pneumothorax. Two thoracostomy catheters remain in place. Scattered atelectatic changes bilaterally left greater than right. Electronically Signed   By: Inez Catalina M.D.   On: 12/04/2015 07:50   Dg Chest Port 1 View  Result Date: 12/03/2015 CLINICAL DATA:  Post thoracoscopy and pleurodesis. EXAM: PORTABLE CHEST 1 VIEW COMPARISON:  12/02/2015 FINDINGS: left central line in place with the tip in the SVC. Two left chest tubes are in place. Small left apical pneumothorax. Subcutaneous emphysema within the left chest wall. Low lung volumes with mild cardiomegaly and bibasilar atelectasis. Scattered areas of atelectasis in the left lung. IMPRESSION: Low volumes with right base atelectasis and scattered atelectasis throughout the left lung. Small left apical pneumothorax.  Two left chest tubes in place. Electronically Signed   By: Rolm Baptise M.D.   On: 12/03/2015 13:42   Dg Chest Port 1 View  Result Date: 12/02/2015 CLINICAL DATA:  Chest tube placement for pneumothorax. EXAM: PORTABLE CHEST 1 VIEW COMPARISON:  11/30/2015.  Chest CT 12/01/2015. FINDINGS: Background pattern of emphysema with previous pulmonary resection surgery at the right apex. No pneumothorax on the right. Left chest tube is in place. Small amount of pleural air evident at the apex and at the base. Small amount of chest wall air and air in the neck, increasing. Mild atelectasis in both lower lobes. Cardiomegaly. Aortic atherosclerosis. IMPRESSION: Left chest tube remains in place. Small amount of pleural air persists, visible to the left apex and left base. Lower lobe atelectasis. Increasing chest wall and neck air on the left. Electronically Signed   By: Nelson Chimes M.D.   On: 12/02/2015 07:50   Dg Chest Port 1 View  Result Date: 11/30/2015 CLINICAL DATA:  Followup for pneumothorax. EXAM: PORTABLE CHEST 1 VIEW COMPARISON:  11/29/2015 FINDINGS: There is no convincing residual left  pneumothorax. Opacity at the left lung base is stable consistent with atelectasis. Left-sided subcutaneous emphysema has mildly decreased from the previous day's study. There is a stable anastomosis staple line along the right mid to upper lung. Left chest tube is stable. IMPRESSION: 1. No convincing residual left pneumothorax. 2. Persistent left lung base opacity consistent with atelectasis. No convincing pneumonia or pulmonary edema. 3. Stable left chest tube. Electronically Signed   By: Lajean Manes M.D.   On: 11/30/2015 07:58   Dg Chest Port 1 View  Result Date: 11/29/2015 CLINICAL DATA:  Follow-up left pneumothorax. EXAM: PORTABLE CHEST 1 VIEW COMPARISON:  Chest x-ray  from earlier same day and chest x-rays dated 11/28/2015. FINDINGS: Left-sided chest tube is stable in position with tip directed towards the lung apex. There is near complete resolution of the pneumothorax. Streaky opacities at the left lung base are likely associated atelectasis and/or small effusion. Right lung is clear. Heart size is normal. Median sternotomy wires appear intact and stable in alignment. IMPRESSION: 1. Near complete resolution of the left pneumothorax. Only a small pneumothorax remains. Left-sided chest tube remains well positioned with tip directed towards the lung apex. 2. Associated mild atelectasis and/or small effusion at the left lung base. 3. Right lung is clear. Electronically Signed   By: Franki Cabot M.D.   On: 11/29/2015 11:59   Dg Chest Port 1 View  Result Date: 11/29/2015 CLINICAL DATA:  Evaluate chest tube placement. EXAM: PORTABLE CHEST 1 VIEW COMPARISON:  November 28, 2015 FINDINGS: Left chest tube remains in the left apex. However, the left-sided pneumothorax is much larger in the interval measuring up to 3 cm in thickness laterally and 4.8 cm near the base. Stable cardiomediastinal silhouette. No evidence of tension caused but the pneumothorax. Postsurgical changes in the right apex. No right-sided  pneumothorax. The right lung is unchanged. Mild pleural thickening near the right apex is stable, likely due to scarring. No other interval changes. IMPRESSION: The left-sided chest tube remains in place but the left-sided pneumothorax is much larger in the interval and there is now air in the subcutaneous tissues of the lateral chest wall on the left. Findings called to the patient's Nurse, Danielle. Keenan Bachelor. Electronically Signed   By: Dorise Bullion III M.D   On: 11/29/2015 07:56   Dg Chest Port 1 View  Result Date: 11/28/2015 CLINICAL DATA:  Left chest tube in place.  Pneumothorax. EXAM: PORTABLE CHEST 1 VIEW COMPARISON:  Earlier today FINDINGS: There has been interval placement of a left-sided chest tube. There has been near complete resolution of previous large left-sided pneumothorax. Atelectasis is identified within the left base. Right lung is clear. IMPRESSION: 1. Interval placement of left-sided chest tube with near complete resolution of large left pneumothorax. Electronically Signed   By: Kerby Moors M.D.   On: 11/28/2015 18:06   Dg C-arm 1-60 Min  Result Date: 12/10/2015 CLINICAL DATA:  ORIF left femur fracture EXAM: DG C-ARM 61-120 MIN; LEFT FEMUR 2 VIEWS COMPARISON:  None FLUOROSCOPY TIME:  1 minutes 57 seconds FINDINGS: Intertrochanteric fracture transfixed with an intramedullary nail and 2 cannulated femoral neck screws. Fracture is in near anatomic alignment. IMPRESSION: ORIF left intertrochanteric fracture. Electronically Signed   By: Kathreen Devoid   On: 12/10/2015 16:12   Dg Hip Unilat W Or Wo Pelvis 2-3 Views Left  Result Date: 12/10/2015 CLINICAL DATA:  Pain following fall EXAM: DG HIP (WITH OR WITHOUT PELVIS) 2-3V LEFT COMPARISON:  August 19, 2015 FINDINGS: Frontal pelvis as well as frontal and lateral left hip images were obtained. There is a comminuted fracture of the intertrochanteric femur region with varus angulation at the fracture site. Evidence of prior subtle fracture in mid  left acetabulum, nondisplaced. No other fractures are evident. No dislocation. There is mild symmetric narrowing of both hip joints. There is degenerative change in the lower lumbar spine. There is calcification in the distal aorta and proximal common iliac arteries. IMPRESSION: Comminuted intertrochanteric femur fracture on the left with varus angulation at the fracture site. Prior nondisplaced fracture mid acetabulum, stable. Symmetric narrowing both hip joints. No dislocation. Aortoiliac atherosclerosis noted. Electronically Signed  By: Lowella Grip III M.D.   On: 12/10/2015 08:49   Dg Femur Min 2 Views Left  Result Date: 12/10/2015 CLINICAL DATA:  ORIF left femur fracture EXAM: DG C-ARM 61-120 MIN; LEFT FEMUR 2 VIEWS COMPARISON:  None FLUOROSCOPY TIME:  1 minutes 57 seconds FINDINGS: Intertrochanteric fracture transfixed with an intramedullary nail and 2 cannulated femoral neck screws. Fracture is in near anatomic alignment. IMPRESSION: ORIF left intertrochanteric fracture. Electronically Signed   By: Kathreen Devoid   On: 12/10/2015 16:12   Discharge Instructions: Discharge Instructions    AMB Referral to [facility]    Complete by:  As directed    Reason for consult:  Skilled nursing facility followup   Expected date of contact:  1-3 days (reserved for hospital discharges)   Please assign patient to social worker for post hospital follow up at skilled facility. RE-admission follow up for transition of returning home. Consent signed.   Natividad Brood, RN BSN [facility] Liaison  [phone] business mobile phone Toll free office [phone]   Call MD for:  persistant nausea and vomiting    Complete by:  As directed    Call MD for:  severe uncontrolled pain    Complete by:  As directed    Call MD for:  temperature >100.4    Complete by:  As directed    Diet - low sodium heart healthy    Complete by:  As directed    Increase activity slowly    Complete  by:  As directed    Weight bearing as tolerated    Complete by:  As directed       Signed: Ledell Noss, MD 12/15/2015, 3:28 PM   Pager: [phone]

## 2015-12-15 NOTE — Progress Notes (Signed)
   Subjective: [name] is feeling well today, she was afebrile overnight and says her pain has been well controlled. Yesterday she worked with physical therapy and felt more comfortable when getting out of bed to move to her chair.   Objective:  Vital signs in last 24 hours: Vitals:   12/14/15 2120 12/15/15 0628 12/15/15 0835 12/15/15 0935  BP: 130/76 123/70 (!) 132/57   Pulse: (!) 116 92 (!) 120   Resp: 16     Temp: 99.7 F (37.6 C) 98.7 F (37.1 C)    TempSrc: Oral Oral    SpO2: 94% 94% 97% 93%  Weight:      Height:       Physical Exam  Constitutional: No distress.  Cardiovascular: Normal rate and regular rhythm.   No murmur heard. Pulmonary/Chest: She has no wheezes. She has no rales.  Abdominal: Soft. She exhibits no distension. There is no tenderness.  Extremities: no calf tenderness, no peripheral edema   Medications: Infusions: . sodium chloride 75 mL/hr at 12/14/15 1002  . lactated ringers 50 mL/hr at 12/10/15 1448   Scheduled Medications: . aspirin EC  81 mg Oral Daily  . atorvastatin  40 mg Oral QHS  . carvedilol  25 mg Oral BID WC  . cefTRIAXone (ROCEPHIN)  IV  1 g Intravenous Q24H  . cholecalciferol  400 Units Oral Daily  . methimazole  15 mg Oral QPM  . mometasone-formoterol  2 puff Inhalation BID  . senna-docusate  1 tablet Oral BID  . warfarin  5 mg Oral QPM  . Warfarin - Pharmacist Dosing Inpatient   Does not apply q1800   PRN Medications: acetaminophen **OR** acetaminophen, albuterol, alum & mag hydroxide-simeth, menthol-cetylpyridinium **OR** phenol, methocarbamol **OR** methocarbamol (ROBAXIN)  IV, metoCLOPramide **OR** metoCLOPramide (REGLAN) injection, ondansetron **OR** ondansetron (ZOFRAN) IV, oxyCODONE  Assessment/Plan: Pt is a 70 y.o. yo female with a PMHx of pelvic fracture, COPD,chronic systolic CHF, afib, aortic stenosis s/p valve replacement, hyperthyroid, spontaneous pneumothorax, hypertension and  hyperlipidemiawho was admitted on 12/10/2015 with left hip fracture     Hip fracture (HCC) She is postop day 6 after open reduction and internal fixation. She continues to use incentive spirometer every regularly. Her pains been well controlled. Incision site dressing will be changed today. I spoke with her orthopedic surgeon Dr. Erlinda Hong he feels that she is made good improvement and is ready for discharge to SNF. We spoke with the patient about this she also feels ready to continue care there.  Fever  She has been afebrile for the last 48 hours. Her blood cultures have no growth to date. Today we will stop her ceftriaxone and switch to nitrofurantoin for a 7 day total course to treat for UTI.     Atrial fibrillation (HCC) Heart rate remains elevated with normal rhythm on exam. We will continue carvedilol 25 mg BID. INR 2.19 today we have discontinued lovenox and will continue warfarin 5 mg with daily INR.     Osteoporosis   Vitamin D deficiency We will continue her vitamin D supplementation and have started fosamax 70 mg weekly today.     Chronic systolic CHF (congestive heart failure) (HCC) Lung sounds are clear and she has trace lower extremity edema on exam. She appears clinically euvolemic. Continue carvedilol 25 mg BID will start her lisinopril 5 mg at discharge.   Dispo: Anticipated discharge in approximately 1-2 day(s).   LOS: 5 days   Ledell Noss, MD 12/15/2015, 10:30 AM Pager: [phone]

## 2015-12-15 NOTE — Care Management Important Message (Signed)
Important Message  Patient Details  Name: Danielle [name] MRN: [id number] Date of Birth: [birth year]   Medicare Important Message Given:  Yes    Nathen May 12/15/2015, 1:58 PM

## 2015-12-15 NOTE — Progress Notes (Signed)
  Date: 12/15/2015  Patient name: Danielle [name]  Medical record number: [id number]  Date of birth: Sep 11, 1945   I have seen and examined patient with residents on morning rounds. Case discussed with residents in detail. I agree with findings and plan as documented in Dr. Fredrik Cove note.   Patient was admitted s/p fall with L hip fracture now POD #6 s/p ORIF. Patient stable for d/c to SNF today. Will transition to PO abx to treat UTI.    Aldine Contes, MD 12/15/2015, 8:18 PM

## 2015-12-15 NOTE — Progress Notes (Addendum)
SW spoke with patient at bedside who confirms that she chooses [facility] for SNF. Patient was alert and oriented. Patient states that she has informed her husband about [facility] and he will be to facility once he gets off of work at [employer]; Sw made [facility] aware. SW spoke with Wendy/Admissions who states that pt is welcomed to come today.   SW arranged PTAR for 4:45 Tilda Burrow, MSW [phone]

## 2015-12-15 NOTE — Progress Notes (Signed)
Tried calling report to [facility] was connected to a voicemail.  Left message will try to call back.

## 2015-12-18 LAB — CULTURE, BLOOD (ROUTINE X 2)
CULTURE: NO GROWTH
Culture: NO GROWTH

## 2015-12-19 DIAGNOSIS — E0590 Thyrotoxicosis, unspecified without thyrotoxic crisis or storm: Secondary | ICD-10-CM | POA: Diagnosis not present

## 2015-12-19 DIAGNOSIS — J449 Chronic obstructive pulmonary disease, unspecified: Secondary | ICD-10-CM | POA: Diagnosis not present

## 2015-12-19 DIAGNOSIS — S72001A Fracture of unspecified part of neck of right femur, initial encounter for closed fracture: Secondary | ICD-10-CM | POA: Diagnosis not present

## 2015-12-19 DIAGNOSIS — I509 Heart failure, unspecified: Secondary | ICD-10-CM | POA: Diagnosis not present

## 2015-12-19 DIAGNOSIS — I4891 Unspecified atrial fibrillation: Secondary | ICD-10-CM | POA: Diagnosis not present

## 2015-12-22 ENCOUNTER — Other Ambulatory Visit: Payer: Self-pay | Admitting: Thoracic Surgery (Cardiothoracic Vascular Surgery)

## 2015-12-22 DIAGNOSIS — J439 Emphysema, unspecified: Secondary | ICD-10-CM

## 2015-12-23 ENCOUNTER — Ambulatory Visit (INDEPENDENT_AMBULATORY_CARE_PROVIDER_SITE_OTHER): Payer: Self-pay | Admitting: Thoracic Surgery (Cardiothoracic Vascular Surgery)

## 2015-12-23 ENCOUNTER — Encounter: Payer: Self-pay | Admitting: Thoracic Surgery (Cardiothoracic Vascular Surgery)

## 2015-12-23 ENCOUNTER — Ambulatory Visit
Admission: RE | Admit: 2015-12-23 | Discharge: 2015-12-23 | Disposition: A | Discharge status: Home / Self Care | Payer: Medicare Other | Source: Ambulatory Visit | Attending: Thoracic Surgery (Cardiothoracic Vascular Surgery) | Admitting: Thoracic Surgery (Cardiothoracic Vascular Surgery)

## 2015-12-23 ENCOUNTER — Other Ambulatory Visit: Payer: Self-pay | Admitting: *Deleted

## 2015-12-23 VITALS — BP 102/75 | HR 72 | Resp 16 | Ht 67.0 in | Wt 209.0 lb

## 2015-12-23 DIAGNOSIS — J9382 Other air leak: Secondary | ICD-10-CM

## 2015-12-23 DIAGNOSIS — IMO0002 Reserved for concepts with insufficient information to code with codable children: Secondary | ICD-10-CM

## 2015-12-23 DIAGNOSIS — J9383 Other pneumothorax: Secondary | ICD-10-CM

## 2015-12-23 DIAGNOSIS — Z09 Encounter for follow-up examination after completed treatment for conditions other than malignant neoplasm: Secondary | ICD-10-CM

## 2015-12-23 DIAGNOSIS — J439 Emphysema, unspecified: Secondary | ICD-10-CM

## 2015-12-23 DIAGNOSIS — R918 Other nonspecific abnormal finding of lung field: Secondary | ICD-10-CM | POA: Diagnosis not present

## 2015-12-23 NOTE — Patient Outreach (Addendum)
  [facility]) Care Management  [facility] Social Work  12/23/2015  Danielle [name] 21-Apr-1945 [id number]  Subjective:  error  Objective:   Encounter Medications:  Outpatient Encounter Prescriptions as of 12/23/2015  Medication Sig  . albuterol (PROAIR HFA) 108 (90 Base) MCG/ACT inhaler Inhale 2 puffs into the lungs every 6 (six) hours as needed for wheezing or shortness of breath.   Marland Kitchen alendronate (FOSAMAX) 70 MG tablet Take 1 tablet (70 mg total) by mouth once a week. Take with a full glass of water on an empty stomach.  Marland Kitchen aspirin EC 81 MG tablet Take 81 mg by mouth daily.  Marland Kitchen atorvastatin (LIPITOR) 40 MG tablet Take 40 mg by mouth at bedtime.  . budesonide-formoterol (SYMBICORT) 160-4.5 MCG/ACT inhaler Inhale 2 puffs into the lungs 2 (two) times daily.  . carvedilol (COREG) 6.25 MG tablet Take 1 tablet (6.25 mg total) by mouth 2 (two) times daily with a meal.  . cephALEXin (KEFLEX) 500 MG capsule Take 1 capsule (500 mg total) by mouth 2 (two) times daily. Stop date 10/18  . Cholecalciferol (VITAMIN D3) 400 units tablet Take 1 tablet (400 Units total) by mouth daily.  Marland Kitchen HYDROcodone-acetaminophen (NORCO) 10-325 MG tablet Take 1-2 tablets by mouth every 6 (six) hours as needed for moderate pain or severe pain.  . methimazole (TAPAZOLE) 5 MG tablet Take 15 mg by mouth every evening.  . methocarbamol (ROBAXIN) 500 MG tablet Take 1 tablet (500 mg total) by mouth every 6 (six) hours as needed for muscle spasms.  Marland Kitchen oxyCODONE (OXY IR/ROXICODONE) 5 MG immediate release tablet Take 1 tablet (5 mg total) by mouth every 4 (four) hours as needed for breakthrough pain ((for MODERATE breakthrough pain)).  Marland Kitchen ranitidine (ZANTAC) 150 MG tablet Take 150 mg by mouth 2 (two) times daily.  Marland Kitchen senna-docusate (SENOKOT-S) 8.6-50 MG tablet Take 1 tablet by mouth 2 (two) times daily.   Marland Kitchen warfarin (COUMADIN) 5 MG tablet Take 1 tablet (5 mg total) by mouth daily at 6 PM.  . warfarin  (COUMADIN) 5 MG tablet Take 1 tablet (5 mg total) by mouth every evening.   No facility-administered encounter medications on file as of 12/23/2015.     Functional Status:  In your present state of health, do you have any difficulty performing the following activities: 12/23/2015 12/04/2015  Hearing? N -  Vision? N -  Difficulty concentrating or making decisions? N -  Walking or climbing stairs? Y -  Dressing or bathing? Y -  Doing errands, shopping? [name]  Preparing Food and eating ? N -  Using the Toilet? Y -  In the past six months, have you accidently leaked urine? N -  Do you have problems with loss of bowel control? N -  Managing your Medications? N -  Managing your Finances? N -  Housekeeping or managing your Housekeeping? Y -    Fall/Depression Screening:  PHQ 2/9 Scores 12/23/2015  PHQ - 2 Score 0    Assessment: error Plan:

## 2015-12-23 NOTE — Progress Notes (Signed)
[address]       [place] [address]             [id number]       HPI: Mrs. Danielle [name] returns today for scheduled postoperative follow-up visit.  Mrs. Pulliam-McEachen is a 70 year old woman who was admitted in late September with a left spontaneous pneumothorax. She has a history of a spontaneous pneumothorax on the right and a prior VATS procedure for that at an outside hospital. The chest tube was placed but her air leak did not resolve. She required a left VATS and blebectomy on 12/03/2015. She did well postoperatively and went home on postoperative day #6.  Unfortunately, the following morning she tripped on a comfort her and fell fracturing her left femur. She had to have ORIF of that she then was discharged to rehabilitation. She says the rehabilitation is been going well so far. She has not had any problems with her breathing. She has minimal discomfort from her chest incisions.   Past Medical History:  Diagnosis Date  . Aortic stenosis    Status post bioprosthetic AVR  . Arthritis    DJD  . Asthma   . Chronic systolic CHF (congestive heart failure) (HCC)    EF 30% 04/2014  . Coronary artery disease   . Gastritis   . Hyperlipidemia   . Hypertension   . Hyperthyroidism   . Multiple thyroid nodules   . Pelvis fracture ([place]) 08/19/2015   MULTIPLE   . Spontaneous pneumothorax 11/28/2015   left   . Stroke [facility])     Current Outpatient Prescriptions  Medication Sig Dispense Refill  . albuterol (PROAIR HFA) 108 (90 Base) MCG/ACT inhaler Inhale 2 puffs into the lungs every 6 (six) hours as needed for wheezing or shortness of breath.     Marland Kitchen alendronate (FOSAMAX) 70 MG tablet Take 1 tablet (70 mg total) by mouth once a week. Take with a full glass of water on an empty stomach. 4 tablet 0  . aspirin EC 81 MG tablet Take 81 mg by mouth daily.    Marland Kitchen atorvastatin (LIPITOR) 40 MG tablet Take 40 mg by mouth at bedtime.    . budesonide-formoterol  (SYMBICORT) 160-4.5 MCG/ACT inhaler Inhale 2 puffs into the lungs 2 (two) times daily.    . carvedilol (COREG) 6.25 MG tablet Take 1 tablet (6.25 mg total) by mouth 2 (two) times daily with a meal. 60 tablet 1  . cephALEXin (KEFLEX) 500 MG capsule Take 1 capsule (500 mg total) by mouth 2 (two) times daily. Stop date 10/18 4 capsule 0  . Cholecalciferol (VITAMIN D3) 400 units tablet Take 1 tablet (400 Units total) by mouth daily. 30 tablet 0  . HYDROcodone-acetaminophen (NORCO) 10-325 MG tablet Take 1-2 tablets by mouth every 6 (six) hours as needed for moderate pain or severe pain. 90 tablet 0  . methimazole (TAPAZOLE) 5 MG tablet Take 15 mg by mouth every evening.    . methocarbamol (ROBAXIN) 500 MG tablet Take 1 tablet (500 mg total) by mouth every 6 (six) hours as needed for muscle spasms. 30 tablet 0  . oxyCODONE (OXY IR/ROXICODONE) 5 MG immediate release tablet Take 1 tablet (5 mg total) by mouth every 4 (four) hours as needed for breakthrough pain ((for MODERATE breakthrough pain)). 30 tablet 0  . ranitidine (ZANTAC) 150 MG tablet Take 150 mg by mouth 2 (two) times daily.    Marland Kitchen senna-docusate (SENOKOT-S) 8.6-50 MG tablet Take 1 tablet by  mouth 2 (two) times daily.     Marland Kitchen warfarin (COUMADIN) 5 MG tablet Take 1 tablet (5 mg total) by mouth daily at 6 PM. 30 tablet 1  . warfarin (COUMADIN) 5 MG tablet Take 1 tablet (5 mg total) by mouth every evening. 30 tablet 0   No current facility-administered medications for this visit.     Physical Exam BP 102/75   Pulse 72   Resp 16   Ht 5\' 7"  (1.702 m)   Wt 209 lb (94.8 kg)   BMI 32.22 kg/m  70 year old woman in no acute distress Alert and oriented 3 with no focal deficits Cardiac regular rate and rhythm normal S1 and S2 Lungs clear with equal breath says bilaterally Chest incisions well healed  Diagnostic Tests: CHEST  2 VIEW  COMPARISON:  12/12/2015 .  FINDINGS: Prior cardiac valve replacement. Heart size stable.  Postsurgical changes left upper lobe. No left upper lobe air-fluid level noted on today's exam P Stable bilateral upper and left base pleural parenchymal thickening consistent with scarring .  IMPRESSION: 1. Postsurgical changes left upper lobe. No air-fluid level in the left upper lobe on today's exam.  2.  Biapical left base pleural parenchymal scarring.   Electronically Signed   By: Marcello Moores  Register   On: 12/23/2015 09:25  I personally reviewed the Chest xray and concur with the findings noted above  Impression: 70 year old woman who presented with a left spontaneous pneumothorax and required VATS apical blebectomy. She had multiple large blebs and a ruptured bleb at the apex. She is doing well at this time for thoracic surgical standpoint. She's not having any respiratory issues. She is using her inhaler a couple times day. She has minimal discomfort associated with her chest incisions.  There are no restrictions on her activities from a thoracic surgical standpoint.   There is a very small risk (1-2%) of recurrent pneumothorax.  She continues to have this pain in rehabilitation for her left hip.  Plan: No further follow-up needed  I will be happy to see her back any time if I can be of any further assistance with her care.  Melrose Nakayama, MD [facility] [phone]

## 2015-12-24 ENCOUNTER — Encounter: Payer: Self-pay | Admitting: *Deleted

## 2015-12-24 ENCOUNTER — Telehealth (INDEPENDENT_AMBULATORY_CARE_PROVIDER_SITE_OTHER): Payer: Self-pay | Admitting: *Deleted

## 2015-12-24 NOTE — Telephone Encounter (Signed)
[facility] has called stating that their phone system is down. Made them aware that Dr. Erlinda Hong could see the patient on Thursday at 39 and they states that they will call back if patient came in at that time.

## 2015-12-24 NOTE — Patient Outreach (Addendum)
[facility]) Care Management  [facility] Social Work  12/24/2015  Danielle [name] 1945-11-27 [id number]  Subjective:  Patient is a 70 year old female, currently in rehab [facility].  Objective:   Encounter Medications:  Outpatient Encounter Prescriptions as of 12/23/2015  Medication Sig  . albuterol (PROAIR HFA) 108 (90 Base) MCG/ACT inhaler Inhale 2 puffs into the lungs every 6 (six) hours as needed for wheezing or shortness of breath.   Marland Kitchen alendronate (FOSAMAX) 70 MG tablet Take 1 tablet (70 mg total) by mouth once a week. Take with a full glass of water on an empty stomach.  Marland Kitchen aspirin EC 81 MG tablet Take 81 mg by mouth daily.  Marland Kitchen atorvastatin (LIPITOR) 40 MG tablet Take 40 mg by mouth at bedtime.  . budesonide-formoterol (SYMBICORT) 160-4.5 MCG/ACT inhaler Inhale 2 puffs into the lungs 2 (two) times daily.  . carvedilol (COREG) 6.25 MG tablet Take 1 tablet (6.25 mg total) by mouth 2 (two) times daily with a meal.  . cephALEXin (KEFLEX) 500 MG capsule Take 1 capsule (500 mg total) by mouth 2 (two) times daily. Stop date 10/18  . Cholecalciferol (VITAMIN D3) 400 units tablet Take 1 tablet (400 Units total) by mouth daily.  Marland Kitchen HYDROcodone-acetaminophen (NORCO) 10-325 MG tablet Take 1-2 tablets by mouth every 6 (six) hours as needed for moderate pain or severe pain.  . methimazole (TAPAZOLE) 5 MG tablet Take 15 mg by mouth every evening.  . methocarbamol (ROBAXIN) 500 MG tablet Take 1 tablet (500 mg total) by mouth every 6 (six) hours as needed for muscle spasms.  Marland Kitchen oxyCODONE (OXY IR/ROXICODONE) 5 MG immediate release tablet Take 1 tablet (5 mg total) by mouth every 4 (four) hours as needed for breakthrough pain ((for MODERATE breakthrough pain)).  Marland Kitchen ranitidine (ZANTAC) 150 MG tablet Take 150 mg by mouth 2 (two) times daily.  Marland Kitchen senna-docusate (SENOKOT-S) 8.6-50 MG tablet Take 1 tablet by mouth 2 (two) times daily.   Marland Kitchen warfarin  (COUMADIN) 5 MG tablet Take 1 tablet (5 mg total) by mouth daily at 6 PM.  . warfarin (COUMADIN) 5 MG tablet Take 1 tablet (5 mg total) by mouth every evening.   No facility-administered encounter medications on file as of 12/23/2015.     Functional Status:  In your present state of health, do you have any difficulty performing the following activities: 12/23/2015 12/04/2015  Hearing? N -  Vision? N -  Difficulty concentrating or making decisions? N -  Walking or climbing stairs? Y -  Dressing or bathing? Y -  Doing errands, shopping? [name]  Preparing Food and eating ? N -  Using the Toilet? Y -  In the past six months, have you accidently leaked urine? N -  Do you have problems with loss of bowel control? N -  Managing your Medications? N -  Managing your Finances? N -  Housekeeping or managing your Housekeeping? Y -    Fall/Depression Screening:  PHQ 2/9 Scores 12/23/2015  PHQ - 2 Score 0    Assessment: This Education officer, museum met with patient in her room at [name].  Per patient, she is  not totally weight bearing, however her 20 days will be up next Wednesday and she cannot afford to go into co-pay days.  Per patient, she resides with her husband who works full time, he is her main caregiver.  Per patient, he transports her to her medical appointments on his days off, mostly Thursdays. Patient states  that she will meet with the discharge planner on 12/24/15 to discuss discharge plans, appeals process, wheelchair and bedside commode needs. Per patient, advanced home care will likely be the agency that will follow her post discharge.  Per patient, she is motivated to improve her health and to continue to be active in PT and OT so that she can get strong enough to be more independent.  Plan: This Education officer, museum will follow up with patient within 1 week to discuss status of her discharge from [name].   [name] [facility] Care Management [phone]

## 2015-12-24 NOTE — Telephone Encounter (Signed)
I will wait for call back if no call back soon I will try again to call facility or pt. We need to see her for a 2 week Po. She is due.Her SU was  12/10/15

## 2015-12-24 NOTE — Telephone Encounter (Signed)
Tried [facility]. I tried several times and no answer. Phone did not ring. Called pt and advised if the staff at [facility] can call us to schedule her 2 week PO appt. Today marks two weeks since her Su. She can come in this week (Thurs or Friday ). If she or the staff at [facility] calls please schedule appt thanks.

## 2015-12-24 NOTE — Telephone Encounter (Signed)
Pt. facility called to cancel appt. Pt. Can only come on thursdays due to transportation. Needs 2 week PO appt. Call back number [phone] [facility] rehab

## 2015-12-25 ENCOUNTER — Ambulatory Visit (INDEPENDENT_AMBULATORY_CARE_PROVIDER_SITE_OTHER): Payer: Medicare Other

## 2015-12-25 ENCOUNTER — Encounter (INDEPENDENT_AMBULATORY_CARE_PROVIDER_SITE_OTHER): Payer: Self-pay | Admitting: Orthopaedic Surgery

## 2015-12-25 ENCOUNTER — Ambulatory Visit (INDEPENDENT_AMBULATORY_CARE_PROVIDER_SITE_OTHER): Payer: Medicare Other | Admitting: Orthopaedic Surgery

## 2015-12-25 VITALS — Ht 67.0 in | Wt 219.0 lb

## 2015-12-25 DIAGNOSIS — M25552 Pain in left hip: Secondary | ICD-10-CM

## 2015-12-25 DIAGNOSIS — S72002D Fracture of unspecified part of neck of left femur, subsequent encounter for closed fracture with routine healing: Secondary | ICD-10-CM

## 2015-12-25 NOTE — Progress Notes (Signed)
Office Visit Note   Patient: [facility]le [name]           Date of Birth: 11/17/1945           MRN: [id number] Visit Date: 12/25/2015              Requested by: Glendale Chard, MD [address] [place], [place] [address] PCP: Maximino Greenland, MD   Assessment & Plan: Visit Diagnoses:  1. Closed fracture of left hip with routine healing, subsequent encounter   2. Pain in left hip     Plan:  - staples out today - continue PT at SNF - f/u 4 weeks with left hip xrays  Follow-Up Instructions: Return in about 4 weeks (around 01/22/2016) for recheck left hip.   Orders:  Orders Placed This Encounter  Procedures  . XR HIP UNILAT W OR W/O PELVIS 2-3 VIEWS LEFT   No orders of the defined types were placed in this encounter.     Procedures: No procedures performed   Clinical Data: No additional findings.   Subjective: Chief Complaint  Patient presents with  . Left Hip - Routine Post Op    2 wk postop visit.  C/o severe pain this morning because of PT.  Overall doing fine.    Review of Systems   Objective: Vital Signs: Ht 5\' 7"  (1.702 m)   Wt 219 lb (99.3 kg)   BMI 34.30 kg/m   Physical Exam  Left Hip Exam   Comments:  Incisions c/d/i. No signs of infection.  Pain with movement more from anxiety.     Incisions c/d/i, no signs of infection, staples intact Specialty Comments:  No specialty comments available.  Imaging: Xr Hip Unilat W Or W/o Pelvis 2-3 Views Left  Result Date: 12/25/2015 Mild expected subsidence of implant without cutout of lag screws.  Fracture alignment acceptable.    PMFS History: Patient Active Problem List   Diagnosis Date Noted  . Thrombocytopenia ([place]) 12/12/2015  . Anemia 12/12/2015  . Fever 12/12/2015  . Vitamin D deficiency 12/12/2015  . Acute urinary retention 12/11/2015  . Osteoporosis 12/11/2015  . Hip fracture ([place]) 12/10/2015  . Aortic atherosclerosis ([place]) 12/10/2015  . Lung blebs ([place])  12/03/2015  . Pelvic fracture ([place]) 08/19/2015  . Fall at home 08/19/2015  . Hyperlipidemia 08/19/2015  . Asthma 08/19/2015  . Atrial fibrillation ([place]) 08/19/2015  . Hyperthyroidism 08/19/2015  . Chronic systolic CHF (congestive heart failure) ([place])   . 3-vessel CAD   . Aortic stenosis    Past Medical History:  Diagnosis Date  . Aortic stenosis    Status post bioprosthetic AVR  . Arthritis    DJD  . Asthma   . Chronic systolic CHF (congestive heart failure) (HCC)    EF 30% 04/2014  . Coronary artery disease   . Gastritis   . Hyperlipidemia   . Hypertension   . Hyperthyroidism   . Multiple thyroid nodules   . Pelvis fracture ([place]) 08/19/2015   MULTIPLE   . Spontaneous pneumothorax 11/28/2015   left   . Stroke [facility])     Family History  Problem Relation Age of Onset  . Diabetes Mother   . Heart attack Mother   . Diabetes Father   . Lung cancer Father   . Diabetes Sister   . Diabetes Sister   . HIV Brother     Past Surgical History:  Procedure Laterality Date  . CHEST TUBE INSERTION  10/2015  . CORONARY ARTERY BYPASS GRAFT  04/2014  . INTRAMEDULLARY (IM) NAIL INTERTROCHANTERIC Left 12/10/2015   Procedure: INTRAMEDULLARY (IM) NAIL INTERTROCHANTRIC;  Surgeon: Leandrew Koyanagi, MD;  Location: [facility];  Service: Orthopedics;  Laterality: Left;  . PLEURADESIS Left 12/03/2015   Procedure: PLEURADESIS;  Surgeon: Melrose Nakayama, MD;  Location: [facility];  Service: Thoracic;  Laterality: Left;  . RESECTION OF APICAL BLEB Left 12/03/2015   Procedure: BLEBECTOMY;  Surgeon: Melrose Nakayama, MD;  Location: [facility];  Service: Thoracic;  Laterality: Left;  . THORACOSCOPY  12/03/2015  . VALVE REPLACEMENT    . VIDEO ASSISTED THORACOSCOPY Left 12/03/2015   Procedure: VIDEO ASSISTED THORACOSCOPY;  Surgeon: Melrose Nakayama, MD;  Location: [facility];  Service: Thoracic;  Laterality: Left;   Social History   Occupational History  . Retired in 2004    Social History Main Topics  .  Smoking status: Former Smoker    Quit date: 03/01/2010  . Smokeless tobacco: Never Used  . Alcohol use No  . Drug use: No  . Sexual activity: Not on file

## 2015-12-25 NOTE — Telephone Encounter (Signed)
This encounter was created in error - please disregard.  This encounter was created in error - please disregard.

## 2015-12-26 ENCOUNTER — Inpatient Hospital Stay (INDEPENDENT_AMBULATORY_CARE_PROVIDER_SITE_OTHER): Payer: Medicare Other | Admitting: Orthopaedic Surgery

## 2015-12-29 ENCOUNTER — Other Ambulatory Visit: Payer: Self-pay | Admitting: *Deleted

## 2015-12-29 ENCOUNTER — Telehealth (INDEPENDENT_AMBULATORY_CARE_PROVIDER_SITE_OTHER): Payer: Self-pay | Admitting: *Deleted

## 2015-12-29 NOTE — Telephone Encounter (Signed)
Okay to remove the other staples.

## 2015-12-29 NOTE — Telephone Encounter (Signed)
Butch Penny from [facility] called stating Pt. Had previous appt. To remove staples in left hip but Pt. Just realized there was another set of staples that were not removed. Call back is [facility] at [phone].

## 2015-12-29 NOTE — Patient Outreach (Addendum)
[facility]) Care Management  [facility] Social Work  12/29/2015  Danielle [name] 07/30/1945 [id number]  Subjective:  Patient is a 70 year old female, currently in rehab at [facility]  Objective:   Encounter Medications:  Outpatient Encounter Prescriptions as of 12/29/2015  Medication Sig  . albuterol (PROAIR HFA) 108 (90 Base) MCG/ACT inhaler Inhale 2 puffs into the lungs every 6 (six) hours as needed for wheezing or shortness of breath.   Marland Kitchen alendronate (FOSAMAX) 70 MG tablet Take 1 tablet (70 mg total) by mouth once a week. Take with a full glass of water on an empty stomach.  Marland Kitchen aspirin EC 81 MG tablet Take 81 mg by mouth daily.  Marland Kitchen atorvastatin (LIPITOR) 40 MG tablet Take 40 mg by mouth at bedtime.  . budesonide-formoterol (SYMBICORT) 160-4.5 MCG/ACT inhaler Inhale 2 puffs into the lungs 2 (two) times daily.  . carvedilol (COREG) 6.25 MG tablet Take 1 tablet (6.25 mg total) by mouth 2 (two) times daily with a meal.  . cephALEXin (KEFLEX) 500 MG capsule Take 1 capsule (500 mg total) by mouth 2 (two) times daily. Stop date 10/18  . Cholecalciferol (VITAMIN D3) 400 units tablet Take 1 tablet (400 Units total) by mouth daily.  Marland Kitchen HYDROcodone-acetaminophen (NORCO) 10-325 MG tablet Take 1-2 tablets by mouth every 6 (six) hours as needed for moderate pain or severe pain.  . methimazole (TAPAZOLE) 5 MG tablet Take 15 mg by mouth every evening.  . methocarbamol (ROBAXIN) 500 MG tablet Take 1 tablet (500 mg total) by mouth every 6 (six) hours as needed for muscle spasms.  Marland Kitchen oxyCODONE (OXY IR/ROXICODONE) 5 MG immediate release tablet Take 1 tablet (5 mg total) by mouth every 4 (four) hours as needed for breakthrough pain ((for MODERATE breakthrough pain)).  Marland Kitchen ranitidine (ZANTAC) 150 MG tablet Take 150 mg by mouth 2 (two) times daily.  Marland Kitchen senna-docusate (SENOKOT-S) 8.6-50 MG tablet Take 1 tablet by mouth 2 (two) times daily.   Marland Kitchen warfarin (COUMADIN) 5 MG  tablet Take 1 tablet (5 mg total) by mouth daily at 6 PM.  . warfarin (COUMADIN) 5 MG tablet Take 1 tablet (5 mg total) by mouth every evening.   No facility-administered encounter medications on file as of 12/29/2015.     Functional Status:  In your present state of health, do you have any difficulty performing the following activities: 12/23/2015 12/04/2015  Hearing? N -  Vision? N -  Difficulty concentrating or making decisions? N -  Walking or climbing stairs? Y -  Dressing or bathing? Y -  Doing errands, shopping? [name]  Preparing Food and eating ? N -  Using the Toilet? Y -  In the past six months, have you accidently leaked urine? N -  Do you have problems with loss of bowel control? N -  Managing your Medications? N -  Managing your Finances? N -  Housekeeping or managing your Housekeeping? Y -    Fall/Depression Screening:  PHQ 2/9 Scores 12/23/2015  PHQ - 2 Score 0    Assessment: This Education officer, museum  met with patient at [facility] and rehab.  Per patient, she will discharge home on 12/31/15 with [facility] through [place]. Patient's spouse will provided her transportation home.  He is also her primary caretaker, however he works full time. Per patient, "I will be able to manage" Per patient, she is not in need of any additional DME. Patient expressed increased concern about discharging before 12 noon  to avoid being charged an additional day.  This Education officer, museum confirmed with admissions director that if patient's spouse plans to pick her up at 8:30am, she should be discharged well before 12 noon to avoid being charged.  Plan: This Education officer, museum will have RNCM assigned for transition of care.  This social worker will follow up with patient post discharge from rehab to assess for continued social work needs. This Education officer, museum will make a referral for meals on wheels.   [name] [facility] Care Management [phone]

## 2015-12-30 ENCOUNTER — Other Ambulatory Visit: Payer: Self-pay | Admitting: *Deleted

## 2015-12-30 NOTE — Patient Outreach (Signed)
[facility] [facility]) Care Management  12/30/2015  Danielle [name] 1945-11-18 [id number]   Phone call from patient stating that she cannot afford the co-pay for a wheelchair and that a friend of hers was going to let her borrow hers.  She will bring it to patient's home before she discharges.  Patient reports that she still plans to discharge on 12/31/15.    [name] [facility] Care Management [phone]

## 2015-12-30 NOTE — Patient Outreach (Signed)
[place] [facility]) Care Management  12/30/2015  [name] Moshier [birth year] [id number]   Phone call from patient stating that she needed a wheelchair post discharge from [facility].  This social worker spoke with Senna, [facility] to request this as they do not have a discharge planner at this time.  Per Senna, patient's last covered day is 12/31/15 and she will be discharging with home health services on 01/01/16.  She will  make sure that a wheelchair is ordered if she needs one as well as any other DME needs.  Per Senna, she will confirm patient's discharge date with patient, as patient states that she is discharging on 12/31/15.   [name] [facility] Care Management [phone]

## 2016-01-01 ENCOUNTER — Other Ambulatory Visit: Payer: Self-pay | Admitting: *Deleted

## 2016-01-01 DIAGNOSIS — Z4789 Encounter for other orthopedic aftercare: Secondary | ICD-10-CM | POA: Diagnosis not present

## 2016-01-01 DIAGNOSIS — J449 Chronic obstructive pulmonary disease, unspecified: Secondary | ICD-10-CM | POA: Diagnosis not present

## 2016-01-01 DIAGNOSIS — R269 Unspecified abnormalities of gait and mobility: Secondary | ICD-10-CM | POA: Diagnosis not present

## 2016-01-01 DIAGNOSIS — M6281 Muscle weakness (generalized): Secondary | ICD-10-CM | POA: Diagnosis not present

## 2016-01-01 DIAGNOSIS — J45909 Unspecified asthma, uncomplicated: Secondary | ICD-10-CM | POA: Diagnosis not present

## 2016-01-01 NOTE — Patient Outreach (Signed)
[facility]) Care Management  01/01/2016  [name] 04/26/1945 [id number]   Phone call from patient stating that she has not received a call from [place]. This social worker contacted [facility] who stated that there was no order received for [facility].  Phone call to [facility] and rehab, spoke with the admissions director, who transferred me to [facility] who states that she was not aware that patient needed home health.  This Education officer, museum also requested that the patient needed an order for a wheelchair.  Per Dietrich Pates, she will research this and call this Education officer, museum back. Patient informed of the above.  Per patient, her spouse has already gone to [place] care to pick up a new wheelchair for patient.   [name] [facility] Care Management [phone]

## 2016-01-01 NOTE — Patient Outreach (Signed)
[facility]) Care Management  01/01/2016  Danielle [name] [birth year] [id number]   Patient referred for 10 days of Meals on Wheels to assist with transition home from the SNF following a left hip fracture.  Patient has limited support, spouse works full time.   Plan:  This Education officer, museum will follow up with patient to assess for continued community resource needs.    [name] [facility] Care Management [phone]

## 2016-01-02 ENCOUNTER — Other Ambulatory Visit: Payer: Self-pay

## 2016-01-02 NOTE — Patient Outreach (Addendum)
[facility] [facility]) Care Management  01/02/2016   Danielle [name] November 07, 1945 [id number]  Subjective:  I was discharged from [facility] earlier this month I have not heard from [place].  Objective:  Telephonic encounter  Current Medications:  Current Outpatient Prescriptions  Medication Sig Dispense Refill  . albuterol (PROAIR HFA) 108 (90 Base) MCG/ACT inhaler Inhale 2 puffs into the lungs every 6 (six) hours as needed for wheezing or shortness of breath.     Marland Kitchen alendronate (FOSAMAX) 70 MG tablet Take 1 tablet (70 mg total) by mouth once a week. Take with a full glass of water on an empty stomach. 4 tablet 0  . aspirin EC 81 MG tablet Take 81 mg by mouth daily.    Marland Kitchen atorvastatin (LIPITOR) 40 MG tablet Take 40 mg by mouth at bedtime.    . budesonide-formoterol (SYMBICORT) 160-4.5 MCG/ACT inhaler Inhale 2 puffs into the lungs 2 (two) times daily.    . carvedilol (COREG) 6.25 MG tablet Take 1 tablet (6.25 mg total) by mouth 2 (two) times daily with a meal. 60 tablet 1  . cephALEXin (KEFLEX) 500 MG capsule Take 1 capsule (500 mg total) by mouth 2 (two) times daily. Stop date 10/18 4 capsule 0  . Cholecalciferol (VITAMIN D3) 400 units tablet Take 1 tablet (400 Units total) by mouth daily. 30 tablet 0  . HYDROcodone-acetaminophen (NORCO) 10-325 MG tablet Take 1-2 tablets by mouth every 6 (six) hours as needed for moderate pain or severe pain. 90 tablet 0  . methimazole (TAPAZOLE) 5 MG tablet Take 15 mg by mouth every evening.    . methocarbamol (ROBAXIN) 500 MG tablet Take 1 tablet (500 mg total) by mouth every 6 (six) hours as needed for muscle spasms. 30 tablet 0  . oxyCODONE (OXY IR/ROXICODONE) 5 MG immediate release tablet Take 1 tablet (5 mg total) by mouth every 4 (four) hours as needed for breakthrough pain ((for MODERATE breakthrough pain)). 30 tablet 0  . ranitidine (ZANTAC) 150 MG tablet Take 150 mg by mouth 2 (two) times daily.    Marland Kitchen senna-docusate  (SENOKOT-S) 8.6-50 MG tablet Take 1 tablet by mouth 2 (two) times daily.     Marland Kitchen warfarin (COUMADIN) 5 MG tablet Take 1 tablet (5 mg total) by mouth daily at 6 PM. 30 tablet 1  . warfarin (COUMADIN) 5 MG tablet Take 1 tablet (5 mg total) by mouth every evening. 30 tablet 0   No current facility-administered medications for this visit.     Functional Status:  In your present state of health, do you have any difficulty performing the following activities: 01/02/2016 12/23/2015  Hearing? Y N  Vision? N N  Difficulty concentrating or making decisions? N N  Walking or climbing stairs? N Y  Dressing or bathing? Y Y  Doing errands, shopping? [name]  Preparing Food and eating ? N N  Using the Toilet? Y Y  In the past six months, have you accidently leaked urine? Y N  Do you have problems with loss of bowel control? Y N  Managing your Medications? N N  Managing your Finances? N N  Housekeeping or managing your Housekeeping? [name]    Fall/Depression Screening: PHQ 2/9 Scores 01/02/2016 12/23/2015  PHQ - 2 Score 0 0   THN CM Care Plan Problem One   Flowsheet Row Most Recent Value  Care Plan Problem One  Patient recently discharged from a skilled nursing facility after being discharged from acute care  Role Documenting the  Problem One  Care Management Coordinator  Care Plan for Problem One  Active  THN CM Short Term Goal #1 (0-30 days)  Patient will receive appropriate resources needed to transition [facility] to home  [facility] CM Short Term Goal #1 Start Date  01/02/16  Interventions for Short Term Goal #1  Transition of care call made to assess patient needs    [facility] CM Care Plan Problem Two   Flowsheet Row Most Recent Value  Care Plan Problem Two  patient has multiple chronic illnesses  Role Documenting the Problem Two  Care Management Coordinator  Care Plan for Problem Two  Active  Interventions for Problem Two Long Term Goal   Initial telephone for transition of care to assess patient's needs for  transitioning home  THN Long Term Goal (31-90) days  In the next 31 days  patient will have no acute care admissions  [facility] Long Term Goal Start Date  01/02/16     Fall Risk  01/02/2016 12/23/2015  Falls in the past year? Yes Yes  Number falls in past yr: 2 or more 2 or more  Injury with Fall? Yes -  Risk Factor Category  High Fall Risk High Fall Risk  Risk for fall due to : History of fall(s);Impaired balance/gait;Impaired mobility;Impaired vision;Medication side effect History of fall(s);Impaired balance/gait;Impaired mobility  Follow up Follow up appointment -    Assessment:  Patient was discharged from an area skilled nursing facility. Patient reported not having received communication from home health agency. RNCM made telephone contact with [facility], was told orders for services had not been received. Several unsuccessful attempts made to skilled nursing facility to follow up. RNCM left messages for return call. Call made to patient to update patient on progress. Patient reports she would have her church members to provide assistance over the weekend while her husband works 12 hours per day. Patient advises this RNCM she had food, was able to take medications as prescribed.    Plan:  Initial home visit next week to follow up discharge needs.

## 2016-01-05 ENCOUNTER — Other Ambulatory Visit: Payer: Self-pay | Admitting: *Deleted

## 2016-01-05 ENCOUNTER — Ambulatory Visit: Payer: Self-pay | Admitting: *Deleted

## 2016-01-05 ENCOUNTER — Other Ambulatory Visit: Payer: Self-pay

## 2016-01-05 NOTE — Patient Outreach (Signed)
[name]) Care Management  [name] Social Work  01/05/2016  Danielle [name] 02-03-1946 Danielle [name]  Subjective:  Patient is a 70 year old female recently discharged form [name]. Per patient, HH has not started.  She has received no calls.  Objective:   Encounter Medications:  Outpatient Encounter Prescriptions as of 01/05/2016  Medication Sig  . albuterol (PROAIR HFA) 108 (90 Base) MCG/ACT inhaler Inhale 2 puffs into the lungs every 6 (six) hours as needed for wheezing or shortness of breath.   Marland Kitchen alendronate (FOSAMAX) 70 MG tablet Take 1 tablet (70 mg total) by mouth once a week. Take with a full glass of water on an empty stomach. (Patient not taking: Reported on 01/05/2016)  . aspirin EC 81 MG tablet Take 81 mg by mouth daily.  Marland Kitchen atorvastatin (LIPITOR) 40 MG tablet Take 40 mg by mouth at bedtime.  . budesonide-formoterol (SYMBICORT) 160-4.5 MCG/ACT inhaler Inhale 2 puffs into the lungs 2 (two) times daily.  . carvedilol (COREG) 6.25 MG tablet Take 1 tablet (6.25 mg total) by mouth 2 (two) times daily with a meal.  . cephALEXin (KEFLEX) 500 MG capsule Take 1 capsule (500 mg total) by mouth 2 (two) times daily. Stop date 10/18 (Patient not taking: Reported on 01/05/2016)  . Cholecalciferol (VITAMIN D3) 400 units tablet Take 1 tablet (400 Units total) by mouth daily.  Marland Kitchen HYDROcodone-acetaminophen (NORCO) 10-325 MG tablet Take 1-2 tablets by mouth every 6 (six) hours as needed for moderate pain or severe pain.  . methimazole (TAPAZOLE) 5 MG tablet Take 15 mg by mouth every evening.  . methocarbamol (ROBAXIN) 500 MG tablet Take 1 tablet (500 mg total) by mouth every 6 (six) hours as needed for muscle spasms.  Marland Kitchen oxyCODONE (OXY IR/ROXICODONE) 5 MG immediate release tablet Take 1 tablet (5 mg total) by mouth every 4 (four) hours as needed for breakthrough pain ((for MODERATE breakthrough pain)).  Marland Kitchen ranitidine (ZANTAC) 150 MG tablet Take 150 mg by  mouth 2 (two) times daily.  Marland Kitchen senna-docusate (SENOKOT-S) 8.6-50 MG tablet Take 1 tablet by mouth 2 (two) times daily.   Marland Kitchen warfarin (COUMADIN) 5 MG tablet Take 1 tablet (5 mg total) by mouth daily at 6 PM.  . warfarin (COUMADIN) 5 MG tablet Take 1 tablet (5 mg total) by mouth every evening.   No facility-administered encounter medications on file as of 01/05/2016.     Functional Status:  In your present state of health, do you have any difficulty performing the following activities: 01/02/2016 12/23/2015  Hearing? Y N  Vision? N N  Difficulty concentrating or making decisions? N N  Walking or climbing stairs? N Y  Dressing or bathing? Y Y  Doing errands, shopping? [name]  Preparing Food and eating ? N N  Using the Toilet? Y Y  In the past six months, have you accidently leaked urine? Y N  Do you have problems with loss of bowel control? Y N  Managing your Medications? N N  Managing your Finances? N N  Housekeeping or managing your Housekeeping? [name]    Fall/Depression Screening:  PHQ 2/9 Scores 01/02/2016 12/23/2015  PHQ - 2 Score 0 0    Assessment: Co-visit with RNCM [name]. Per patient, home health has not contacted her to date.  Phone call from  [facility]le [name] staff member at  [name] received stating that they had faxed the order for [name]  To [name] 3 times with confirmation.  Contact information given  for [name], their [place] contact for [name]. Per [name], the orders were received however they had to decline the order due to staffing.   This Education officer, museum called this information back in to [name], spoke to  [name], who agrees to fax orders to [name].  RNCM was able to contact [facility], RN, PT OT and a bath aid ordered. Available staffing also confirmed.  Patient has limited support, however declines community resources for personal care assistance. Patient's aunt has been staying with patient since Saturday, however plans to return home  today.  Patient has a church member that comes by every morning to assist with ADL's.  Patient's spouse works full time, however is off every Wednesday and Thursday.  Patient will receive Meals on Wheels for 10 days to assist with the transition home arranged through the [name].   Plan:  This Education officer, museum will follow up with patient within 1 week to ensure [name] services are in place.   [name] Care Management [phone]   Plan:

## 2016-01-06 DIAGNOSIS — S7291XD Unspecified fracture of right femur, subsequent encounter for closed fracture with routine healing: Secondary | ICD-10-CM | POA: Diagnosis not present

## 2016-01-06 DIAGNOSIS — I480 Paroxysmal atrial fibrillation: Secondary | ICD-10-CM | POA: Diagnosis not present

## 2016-01-06 DIAGNOSIS — J449 Chronic obstructive pulmonary disease, unspecified: Secondary | ICD-10-CM | POA: Diagnosis not present

## 2016-01-06 DIAGNOSIS — I509 Heart failure, unspecified: Secondary | ICD-10-CM | POA: Diagnosis not present

## 2016-01-06 NOTE — Patient Outreach (Signed)
[facility]) Care Management  01/06/2016   Danielle [name] 15-Jan-1946 [id number]  Subjective:  I have not been contacted by anybody from any home health agencies.  I am so discouraged.   Objective:  Well nourished, elderly african [place]. Home, single family,  very clean and very neat.    Current Medications:  Current Outpatient Prescriptions  Medication Sig Dispense Refill  . albuterol (PROAIR HFA) 108 (90 Base) MCG/ACT inhaler Inhale 2 puffs into the lungs every 6 (six) hours as needed for wheezing or shortness of breath.     Marland Kitchen aspirin EC 81 MG tablet Take 81 mg by mouth daily.    Marland Kitchen atorvastatin (LIPITOR) 40 MG tablet Take 40 mg by mouth at bedtime.    . budesonide-formoterol (SYMBICORT) 160-4.5 MCG/ACT inhaler Inhale 2 puffs into the lungs 2 (two) times daily.    . carvedilol (COREG) 6.25 MG tablet Take 1 tablet (6.25 mg total) by mouth 2 (two) times daily with a meal. 60 tablet 1  . Cholecalciferol (VITAMIN D3) 400 units tablet Take 1 tablet (400 Units total) by mouth daily. 30 tablet 0  . HYDROcodone-acetaminophen (NORCO) 10-325 MG tablet Take 1-2 tablets by mouth every 6 (six) hours as needed for moderate pain or severe pain. 90 tablet 0  . methimazole (TAPAZOLE) 5 MG tablet Take 15 mg by mouth every evening.    . methocarbamol (ROBAXIN) 500 MG tablet Take 1 tablet (500 mg total) by mouth every 6 (six) hours as needed for muscle spasms. 30 tablet 0  . oxyCODONE (OXY IR/ROXICODONE) 5 MG immediate release tablet Take 1 tablet (5 mg total) by mouth every 4 (four) hours as needed for breakthrough pain ((for MODERATE breakthrough pain)). 30 tablet 0  . ranitidine (ZANTAC) 150 MG tablet Take 150 mg by mouth 2 (two) times daily.    Marland Kitchen senna-docusate (SENOKOT-S) 8.6-50 MG tablet Take 1 tablet by mouth 2 (two) times daily.     Marland Kitchen warfarin (COUMADIN) 5 MG tablet Take 1 tablet (5 mg total) by mouth daily at 6 PM. 30 tablet 1  . warfarin (COUMADIN) 5 MG tablet  Take 1 tablet (5 mg total) by mouth every evening. 30 tablet 0  . alendronate (FOSAMAX) 70 MG tablet Take 1 tablet (70 mg total) by mouth once a week. Take with a full glass of water on an empty stomach. (Patient not taking: Reported on 01/05/2016) 4 tablet 0  . cephALEXin (KEFLEX) 500 MG capsule Take 1 capsule (500 mg total) by mouth 2 (two) times daily. Stop date 10/18 (Patient not taking: Reported on 01/05/2016) 4 capsule 0   No current facility-administered medications for this visit.     Functional Status:  In your present state of health, do you have any difficulty performing the following activities: 01/02/2016 12/23/2015  Hearing? Y N  Vision? N N  Difficulty concentrating or making decisions? N N  Walking or climbing stairs? N Y  Dressing or bathing? Y Y  Doing errands, shopping? [name]  Preparing Food and eating ? N N  Using the Toilet? Y Y  In the past six months, have you accidently leaked urine? Y N  Do you have problems with loss of bowel control? Y N  Managing your Medications? N N  Managing your Finances? N N  Housekeeping or managing your Housekeeping? [name]    Fall/Depression Screening: PHQ 2/9 Scores 01/02/2016 12/23/2015  PHQ - 2 Score 0 0   Fall Risk  01/02/2016 12/23/2015  Falls in the  past year? Yes Yes  Number falls in past yr: 2 or more 2 or more  Injury with Fall? Yes -  Risk Factor Category  High Fall Risk High Fall Risk  Risk for fall due to : History of fall(s);Impaired balance/gait;Impaired mobility;Impaired vision;Medication side effect History of fall(s);Impaired balance/gait;Impaired mobility  Follow up Follow up appointment -   [facility] CM Care Plan Problem One   Flowsheet Row Most Recent Value  Care Plan Problem One  Patient recently discharged from a skilled nursing facility after being discharged from acute care  Role Documenting the Problem One  Care Management [place] for Problem One  Active  THN CM Short Term Goal #1 (0-30 days)   Patient will receive appropriate resources needed to transition safefly to home  [facility] CM Short Term Goal #1 Start Date  01/02/16  Interventions for Short Term Goal #1  Initial home visit to assess community care coordination needs.      [facility] CM Care Plan Problem Two   Flowsheet Row Most Recent Value  Care Plan Problem Two  patient has multiple chronic illnesses  Role Documenting the Problem Two  Care Management Coordinator  Care Plan for Problem Two  Active  Interventions for Problem Two Long Term Goal   home visit to asses community care needs for patient ot remain at home.    THN Long Term Goal (31-90) days  In the next 31 days  patient will have no acute care admissions  [facility] Long Term Goal Start Date  01/02/16     Assessment:  RNCM and LCSW contact alternate [place], to refer patient for HHRN/PT/OT/AIDE because [place] advises they are declined the case due to staffing issues.   Patient lacks knowledge related to heart failure, initiated heart failure education using EMMI HF EMMI  Plan:  telehphone contact next week for assessment of community care coordination needs.

## 2016-01-07 DIAGNOSIS — E785 Hyperlipidemia, unspecified: Secondary | ICD-10-CM | POA: Diagnosis not present

## 2016-01-07 DIAGNOSIS — S7291XD Unspecified fracture of right femur, subsequent encounter for closed fracture with routine healing: Secondary | ICD-10-CM | POA: Diagnosis not present

## 2016-01-07 DIAGNOSIS — K219 Gastro-esophageal reflux disease without esophagitis: Secondary | ICD-10-CM | POA: Diagnosis not present

## 2016-01-07 DIAGNOSIS — E039 Hypothyroidism, unspecified: Secondary | ICD-10-CM | POA: Diagnosis not present

## 2016-01-07 DIAGNOSIS — J449 Chronic obstructive pulmonary disease, unspecified: Secondary | ICD-10-CM | POA: Diagnosis not present

## 2016-01-07 DIAGNOSIS — R339 Retention of urine, unspecified: Secondary | ICD-10-CM | POA: Diagnosis not present

## 2016-01-07 DIAGNOSIS — I2510 Atherosclerotic heart disease of native coronary artery without angina pectoris: Secondary | ICD-10-CM | POA: Diagnosis not present

## 2016-01-07 DIAGNOSIS — Z9181 History of falling: Secondary | ICD-10-CM | POA: Diagnosis not present

## 2016-01-07 DIAGNOSIS — M810 Age-related osteoporosis without current pathological fracture: Secondary | ICD-10-CM | POA: Diagnosis not present

## 2016-01-07 DIAGNOSIS — I480 Paroxysmal atrial fibrillation: Secondary | ICD-10-CM | POA: Diagnosis not present

## 2016-01-07 DIAGNOSIS — D509 Iron deficiency anemia, unspecified: Secondary | ICD-10-CM | POA: Diagnosis not present

## 2016-01-07 DIAGNOSIS — I509 Heart failure, unspecified: Secondary | ICD-10-CM | POA: Diagnosis not present

## 2016-01-08 ENCOUNTER — Ambulatory Visit: Payer: Self-pay

## 2016-01-09 ENCOUNTER — Other Ambulatory Visit: Payer: Self-pay | Admitting: *Deleted

## 2016-01-09 ENCOUNTER — Encounter: Payer: Self-pay | Admitting: *Deleted

## 2016-01-09 ENCOUNTER — Ambulatory Visit: Payer: Self-pay | Admitting: *Deleted

## 2016-01-09 NOTE — Patient Outreach (Signed)
[facility]) Care Management  01/09/2016  Danielle [name] 1945-06-02 [id number]   Phone call to patient to confirm that she had been contacted by [facility].  Patient confirms that the nurse and PT came out on Wednesday.  Per patient, OT has not contacted her yet.  Patient in much better spirits today.  Her family friend drove down from [place] to assist in her care for the weekend.  Patient verbalized no additional community resource needs at this time.  Patient encouraged to contact this social worker if there are any community resource needs in the future.   [name] [facility] Care Management [phone]

## 2016-01-12 DIAGNOSIS — R339 Retention of urine, unspecified: Secondary | ICD-10-CM | POA: Diagnosis not present

## 2016-01-12 DIAGNOSIS — I2510 Atherosclerotic heart disease of native coronary artery without angina pectoris: Secondary | ICD-10-CM | POA: Diagnosis not present

## 2016-01-12 DIAGNOSIS — Z9181 History of falling: Secondary | ICD-10-CM | POA: Diagnosis not present

## 2016-01-12 DIAGNOSIS — M810 Age-related osteoporosis without current pathological fracture: Secondary | ICD-10-CM | POA: Diagnosis not present

## 2016-01-12 DIAGNOSIS — D509 Iron deficiency anemia, unspecified: Secondary | ICD-10-CM | POA: Diagnosis not present

## 2016-01-12 DIAGNOSIS — I509 Heart failure, unspecified: Secondary | ICD-10-CM | POA: Diagnosis not present

## 2016-01-12 DIAGNOSIS — K219 Gastro-esophageal reflux disease without esophagitis: Secondary | ICD-10-CM | POA: Diagnosis not present

## 2016-01-12 DIAGNOSIS — E039 Hypothyroidism, unspecified: Secondary | ICD-10-CM | POA: Diagnosis not present

## 2016-01-12 DIAGNOSIS — S7291XD Unspecified fracture of right femur, subsequent encounter for closed fracture with routine healing: Secondary | ICD-10-CM | POA: Diagnosis not present

## 2016-01-12 DIAGNOSIS — J449 Chronic obstructive pulmonary disease, unspecified: Secondary | ICD-10-CM | POA: Diagnosis not present

## 2016-01-12 DIAGNOSIS — E785 Hyperlipidemia, unspecified: Secondary | ICD-10-CM | POA: Diagnosis not present

## 2016-01-12 DIAGNOSIS — I480 Paroxysmal atrial fibrillation: Secondary | ICD-10-CM | POA: Diagnosis not present

## 2016-01-14 DIAGNOSIS — E785 Hyperlipidemia, unspecified: Secondary | ICD-10-CM | POA: Diagnosis not present

## 2016-01-14 DIAGNOSIS — M810 Age-related osteoporosis without current pathological fracture: Secondary | ICD-10-CM | POA: Diagnosis not present

## 2016-01-14 DIAGNOSIS — Z9181 History of falling: Secondary | ICD-10-CM | POA: Diagnosis not present

## 2016-01-14 DIAGNOSIS — D509 Iron deficiency anemia, unspecified: Secondary | ICD-10-CM | POA: Diagnosis not present

## 2016-01-14 DIAGNOSIS — I480 Paroxysmal atrial fibrillation: Secondary | ICD-10-CM | POA: Diagnosis not present

## 2016-01-14 DIAGNOSIS — E039 Hypothyroidism, unspecified: Secondary | ICD-10-CM | POA: Diagnosis not present

## 2016-01-14 DIAGNOSIS — J449 Chronic obstructive pulmonary disease, unspecified: Secondary | ICD-10-CM | POA: Diagnosis not present

## 2016-01-14 DIAGNOSIS — I509 Heart failure, unspecified: Secondary | ICD-10-CM | POA: Diagnosis not present

## 2016-01-14 DIAGNOSIS — I2510 Atherosclerotic heart disease of native coronary artery without angina pectoris: Secondary | ICD-10-CM | POA: Diagnosis not present

## 2016-01-14 DIAGNOSIS — R339 Retention of urine, unspecified: Secondary | ICD-10-CM | POA: Diagnosis not present

## 2016-01-14 DIAGNOSIS — S7291XD Unspecified fracture of right femur, subsequent encounter for closed fracture with routine healing: Secondary | ICD-10-CM | POA: Diagnosis not present

## 2016-01-14 DIAGNOSIS — K219 Gastro-esophageal reflux disease without esophagitis: Secondary | ICD-10-CM | POA: Diagnosis not present

## 2016-01-15 ENCOUNTER — Other Ambulatory Visit: Payer: Self-pay

## 2016-01-15 DIAGNOSIS — I2510 Atherosclerotic heart disease of native coronary artery without angina pectoris: Secondary | ICD-10-CM | POA: Diagnosis not present

## 2016-01-15 DIAGNOSIS — M810 Age-related osteoporosis without current pathological fracture: Secondary | ICD-10-CM | POA: Diagnosis not present

## 2016-01-15 DIAGNOSIS — Z9181 History of falling: Secondary | ICD-10-CM | POA: Diagnosis not present

## 2016-01-15 DIAGNOSIS — I480 Paroxysmal atrial fibrillation: Secondary | ICD-10-CM | POA: Diagnosis not present

## 2016-01-15 DIAGNOSIS — D509 Iron deficiency anemia, unspecified: Secondary | ICD-10-CM | POA: Diagnosis not present

## 2016-01-15 DIAGNOSIS — I509 Heart failure, unspecified: Secondary | ICD-10-CM | POA: Diagnosis not present

## 2016-01-15 DIAGNOSIS — J449 Chronic obstructive pulmonary disease, unspecified: Secondary | ICD-10-CM | POA: Diagnosis not present

## 2016-01-15 DIAGNOSIS — E785 Hyperlipidemia, unspecified: Secondary | ICD-10-CM | POA: Diagnosis not present

## 2016-01-15 DIAGNOSIS — R339 Retention of urine, unspecified: Secondary | ICD-10-CM | POA: Diagnosis not present

## 2016-01-15 DIAGNOSIS — S7291XD Unspecified fracture of right femur, subsequent encounter for closed fracture with routine healing: Secondary | ICD-10-CM | POA: Diagnosis not present

## 2016-01-15 DIAGNOSIS — E039 Hypothyroidism, unspecified: Secondary | ICD-10-CM | POA: Diagnosis not present

## 2016-01-15 DIAGNOSIS — K219 Gastro-esophageal reflux disease without esophagitis: Secondary | ICD-10-CM | POA: Diagnosis not present

## 2016-01-16 DIAGNOSIS — S7291XD Unspecified fracture of right femur, subsequent encounter for closed fracture with routine healing: Secondary | ICD-10-CM | POA: Diagnosis not present

## 2016-01-16 DIAGNOSIS — E039 Hypothyroidism, unspecified: Secondary | ICD-10-CM | POA: Diagnosis not present

## 2016-01-16 DIAGNOSIS — I480 Paroxysmal atrial fibrillation: Secondary | ICD-10-CM | POA: Diagnosis not present

## 2016-01-16 DIAGNOSIS — E785 Hyperlipidemia, unspecified: Secondary | ICD-10-CM | POA: Diagnosis not present

## 2016-01-16 DIAGNOSIS — K219 Gastro-esophageal reflux disease without esophagitis: Secondary | ICD-10-CM | POA: Diagnosis not present

## 2016-01-16 DIAGNOSIS — Z9181 History of falling: Secondary | ICD-10-CM | POA: Diagnosis not present

## 2016-01-16 DIAGNOSIS — D509 Iron deficiency anemia, unspecified: Secondary | ICD-10-CM | POA: Diagnosis not present

## 2016-01-16 DIAGNOSIS — M810 Age-related osteoporosis without current pathological fracture: Secondary | ICD-10-CM | POA: Diagnosis not present

## 2016-01-16 DIAGNOSIS — J449 Chronic obstructive pulmonary disease, unspecified: Secondary | ICD-10-CM | POA: Diagnosis not present

## 2016-01-16 DIAGNOSIS — R339 Retention of urine, unspecified: Secondary | ICD-10-CM | POA: Diagnosis not present

## 2016-01-16 DIAGNOSIS — I2510 Atherosclerotic heart disease of native coronary artery without angina pectoris: Secondary | ICD-10-CM | POA: Diagnosis not present

## 2016-01-16 DIAGNOSIS — I509 Heart failure, unspecified: Secondary | ICD-10-CM | POA: Diagnosis not present

## 2016-01-16 NOTE — Patient Outreach (Signed)
[place]) Care Management  01/16/2016   Danielle [name] 1945/05/15 [id number]  Subjective:  I am getting real good care now that I have all the services I needed at discharge   Objective:  Telephonic encounter   Current Medications:  Current Outpatient Prescriptions  Medication Sig Dispense Refill  . albuterol (PROAIR HFA) 108 (90 Base) MCG/ACT inhaler Inhale 2 puffs into the lungs every 6 (six) hours as needed for wheezing or shortness of breath.     Marland Kitchen alendronate (FOSAMAX) 70 MG tablet Take 1 tablet (70 mg total) by mouth once a week. Take with a full glass of water on an empty stomach. (Patient not taking: Reported on 01/05/2016) 4 tablet 0  . aspirin EC 81 MG tablet Take 81 mg by mouth daily.    Marland Kitchen atorvastatin (LIPITOR) 40 MG tablet Take 40 mg by mouth at bedtime.    . budesonide-formoterol (SYMBICORT) 160-4.5 MCG/ACT inhaler Inhale 2 puffs into the lungs 2 (two) times daily.    . carvedilol (COREG) 6.25 MG tablet Take 1 tablet (6.25 mg total) by mouth 2 (two) times daily with a meal. 60 tablet 1  . cephALEXin (KEFLEX) 500 MG capsule Take 1 capsule (500 mg total) by mouth 2 (two) times daily. Stop date 10/18 (Patient not taking: Reported on 01/05/2016) 4 capsule 0  . Cholecalciferol (VITAMIN D3) 400 units tablet Take 1 tablet (400 Units total) by mouth daily. 30 tablet 0  . HYDROcodone-acetaminophen (NORCO) 10-325 MG tablet Take 1-2 tablets by mouth every 6 (six) hours as needed for moderate pain or severe pain. 90 tablet 0  . methimazole (TAPAZOLE) 5 MG tablet Take 15 mg by mouth every evening.    . methocarbamol (ROBAXIN) 500 MG tablet Take 1 tablet (500 mg total) by mouth every 6 (six) hours as needed for muscle spasms. 30 tablet 0  . oxyCODONE (OXY IR/ROXICODONE) 5 MG immediate release tablet Take 1 tablet (5 mg total) by mouth every 4 (four) hours as needed for breakthrough pain ((for MODERATE breakthrough pain)). 30 tablet 0  . ranitidine  (ZANTAC) 150 MG tablet Take 150 mg by mouth 2 (two) times daily.    Marland Kitchen senna-docusate (SENOKOT-S) 8.6-50 MG tablet Take 1 tablet by mouth 2 (two) times daily.     Marland Kitchen warfarin (COUMADIN) 5 MG tablet Take 1 tablet (5 mg total) by mouth daily at 6 PM. 30 tablet 1  . warfarin (COUMADIN) 5 MG tablet Take 1 tablet (5 mg total) by mouth every evening. 30 tablet 0   No current facility-administered medications for this visit.     Functional Status:  In your present state of health, do you have any difficulty performing the following activities: 01/02/2016 12/23/2015  Hearing? Y N  Vision? N N  Difficulty concentrating or making decisions? N N  Walking or climbing stairs? N Y  Dressing or bathing? Y Y  Doing errands, shopping? Danielle [name]  Preparing Food and eating ? N N  Using the Toilet? Y Y  In the past six months, have you accidently leaked urine? Y N  Do you have problems with loss of bowel control? Y N  Managing your Medications? N N  Managing your Finances? N N  Housekeeping or managing your Housekeeping? Danielle [name]    Fall/Depression Screening: PHQ 2/9 Scores 01/02/2016 12/23/2015  PHQ - 2 Score 0 0    THN CM Care Plan Problem One   Flowsheet Row Most Recent Value  Care Plan Problem One  Patient recently  discharged from a skilled nursing facility after being discharged from acute care  Role Documenting the Problem One  Care Management [place] for Problem One  Active  THN CM Short Term Goal #1 (0-30 days)  Patient will receive appropriate resources needed to transition safefly to home  [facility] CM Short Term Goal #1 Start Date  01/02/16  [facility] CM Short Term Goal #1 Met Date  01/09/16  Interventions for Short Term Goal #1  Initial home visit to assess community care coordination needs.      [facility] CM Care Plan Problem Two   Flowsheet Row Most Recent Value  Care Plan Problem Two  patient has multiple chronic illnesses  Role Documenting the Problem Two  Care Management Coordinator  Care Plan  for Problem Two  Active  Interventions for Problem Two Long Term Goal   telephone contact to assess need for community care coordination.  Patient states she is very pleased with [facility],   THN Long Term Goal (31-90) days  In the next 31 days  patient will have no acute care admissions  [facility] Long Term Goal Start Date  01/02/16      Assessment:   Patient continues to progress towards her case management goals. THN LCSW and RNCM assisted patient in closing discharge barriers by assisting her in getting connected with a home health agency for [facility], PT/OT.  Plan:   Telephonic contact in the next 30 days to assess patient progress towards reaching her case management goals.

## 2016-01-20 DIAGNOSIS — K219 Gastro-esophageal reflux disease without esophagitis: Secondary | ICD-10-CM | POA: Diagnosis not present

## 2016-01-20 DIAGNOSIS — R339 Retention of urine, unspecified: Secondary | ICD-10-CM | POA: Diagnosis not present

## 2016-01-20 DIAGNOSIS — M810 Age-related osteoporosis without current pathological fracture: Secondary | ICD-10-CM | POA: Diagnosis not present

## 2016-01-20 DIAGNOSIS — J449 Chronic obstructive pulmonary disease, unspecified: Secondary | ICD-10-CM | POA: Diagnosis not present

## 2016-01-20 DIAGNOSIS — S7291XD Unspecified fracture of right femur, subsequent encounter for closed fracture with routine healing: Secondary | ICD-10-CM | POA: Diagnosis not present

## 2016-01-20 DIAGNOSIS — I2510 Atherosclerotic heart disease of native coronary artery without angina pectoris: Secondary | ICD-10-CM | POA: Diagnosis not present

## 2016-01-20 DIAGNOSIS — I509 Heart failure, unspecified: Secondary | ICD-10-CM | POA: Diagnosis not present

## 2016-01-20 DIAGNOSIS — Z9181 History of falling: Secondary | ICD-10-CM | POA: Diagnosis not present

## 2016-01-20 DIAGNOSIS — E039 Hypothyroidism, unspecified: Secondary | ICD-10-CM | POA: Diagnosis not present

## 2016-01-20 DIAGNOSIS — E785 Hyperlipidemia, unspecified: Secondary | ICD-10-CM | POA: Diagnosis not present

## 2016-01-20 DIAGNOSIS — I480 Paroxysmal atrial fibrillation: Secondary | ICD-10-CM | POA: Diagnosis not present

## 2016-01-20 DIAGNOSIS — D509 Iron deficiency anemia, unspecified: Secondary | ICD-10-CM | POA: Diagnosis not present

## 2016-01-23 DIAGNOSIS — J449 Chronic obstructive pulmonary disease, unspecified: Secondary | ICD-10-CM | POA: Diagnosis not present

## 2016-01-23 DIAGNOSIS — I480 Paroxysmal atrial fibrillation: Secondary | ICD-10-CM | POA: Diagnosis not present

## 2016-01-23 DIAGNOSIS — Z9181 History of falling: Secondary | ICD-10-CM | POA: Diagnosis not present

## 2016-01-23 DIAGNOSIS — I509 Heart failure, unspecified: Secondary | ICD-10-CM | POA: Diagnosis not present

## 2016-01-23 DIAGNOSIS — K219 Gastro-esophageal reflux disease without esophagitis: Secondary | ICD-10-CM | POA: Diagnosis not present

## 2016-01-23 DIAGNOSIS — S7291XD Unspecified fracture of right femur, subsequent encounter for closed fracture with routine healing: Secondary | ICD-10-CM | POA: Diagnosis not present

## 2016-01-23 DIAGNOSIS — D509 Iron deficiency anemia, unspecified: Secondary | ICD-10-CM | POA: Diagnosis not present

## 2016-01-23 DIAGNOSIS — M810 Age-related osteoporosis without current pathological fracture: Secondary | ICD-10-CM | POA: Diagnosis not present

## 2016-01-23 DIAGNOSIS — R339 Retention of urine, unspecified: Secondary | ICD-10-CM | POA: Diagnosis not present

## 2016-01-23 DIAGNOSIS — I2510 Atherosclerotic heart disease of native coronary artery without angina pectoris: Secondary | ICD-10-CM | POA: Diagnosis not present

## 2016-01-23 DIAGNOSIS — E785 Hyperlipidemia, unspecified: Secondary | ICD-10-CM | POA: Diagnosis not present

## 2016-01-23 DIAGNOSIS — E039 Hypothyroidism, unspecified: Secondary | ICD-10-CM | POA: Diagnosis not present

## 2016-01-26 DIAGNOSIS — D509 Iron deficiency anemia, unspecified: Secondary | ICD-10-CM | POA: Diagnosis not present

## 2016-01-26 DIAGNOSIS — R339 Retention of urine, unspecified: Secondary | ICD-10-CM | POA: Diagnosis not present

## 2016-01-26 DIAGNOSIS — I2510 Atherosclerotic heart disease of native coronary artery without angina pectoris: Secondary | ICD-10-CM | POA: Diagnosis not present

## 2016-01-26 DIAGNOSIS — I509 Heart failure, unspecified: Secondary | ICD-10-CM | POA: Diagnosis not present

## 2016-01-26 DIAGNOSIS — M810 Age-related osteoporosis without current pathological fracture: Secondary | ICD-10-CM | POA: Diagnosis not present

## 2016-01-26 DIAGNOSIS — E785 Hyperlipidemia, unspecified: Secondary | ICD-10-CM | POA: Diagnosis not present

## 2016-01-26 DIAGNOSIS — E039 Hypothyroidism, unspecified: Secondary | ICD-10-CM | POA: Diagnosis not present

## 2016-01-26 DIAGNOSIS — I480 Paroxysmal atrial fibrillation: Secondary | ICD-10-CM | POA: Diagnosis not present

## 2016-01-26 DIAGNOSIS — S7291XD Unspecified fracture of right femur, subsequent encounter for closed fracture with routine healing: Secondary | ICD-10-CM | POA: Diagnosis not present

## 2016-01-26 DIAGNOSIS — J449 Chronic obstructive pulmonary disease, unspecified: Secondary | ICD-10-CM | POA: Diagnosis not present

## 2016-01-26 DIAGNOSIS — Z9181 History of falling: Secondary | ICD-10-CM | POA: Diagnosis not present

## 2016-01-26 DIAGNOSIS — K219 Gastro-esophageal reflux disease without esophagitis: Secondary | ICD-10-CM | POA: Diagnosis not present

## 2016-01-27 DIAGNOSIS — M810 Age-related osteoporosis without current pathological fracture: Secondary | ICD-10-CM | POA: Diagnosis not present

## 2016-01-27 DIAGNOSIS — R339 Retention of urine, unspecified: Secondary | ICD-10-CM | POA: Diagnosis not present

## 2016-01-27 DIAGNOSIS — K219 Gastro-esophageal reflux disease without esophagitis: Secondary | ICD-10-CM | POA: Diagnosis not present

## 2016-01-27 DIAGNOSIS — I480 Paroxysmal atrial fibrillation: Secondary | ICD-10-CM | POA: Diagnosis not present

## 2016-01-27 DIAGNOSIS — E039 Hypothyroidism, unspecified: Secondary | ICD-10-CM | POA: Diagnosis not present

## 2016-01-27 DIAGNOSIS — S7291XD Unspecified fracture of right femur, subsequent encounter for closed fracture with routine healing: Secondary | ICD-10-CM | POA: Diagnosis not present

## 2016-01-27 DIAGNOSIS — I2510 Atherosclerotic heart disease of native coronary artery without angina pectoris: Secondary | ICD-10-CM | POA: Diagnosis not present

## 2016-01-27 DIAGNOSIS — I509 Heart failure, unspecified: Secondary | ICD-10-CM | POA: Diagnosis not present

## 2016-01-27 DIAGNOSIS — Z9181 History of falling: Secondary | ICD-10-CM | POA: Diagnosis not present

## 2016-01-27 DIAGNOSIS — D509 Iron deficiency anemia, unspecified: Secondary | ICD-10-CM | POA: Diagnosis not present

## 2016-01-27 DIAGNOSIS — J449 Chronic obstructive pulmonary disease, unspecified: Secondary | ICD-10-CM | POA: Diagnosis not present

## 2016-01-27 DIAGNOSIS — E785 Hyperlipidemia, unspecified: Secondary | ICD-10-CM | POA: Diagnosis not present

## 2016-01-28 ENCOUNTER — Ambulatory Visit: Payer: Self-pay

## 2016-01-28 DIAGNOSIS — D509 Iron deficiency anemia, unspecified: Secondary | ICD-10-CM | POA: Diagnosis not present

## 2016-01-28 DIAGNOSIS — E039 Hypothyroidism, unspecified: Secondary | ICD-10-CM | POA: Diagnosis not present

## 2016-01-28 DIAGNOSIS — R339 Retention of urine, unspecified: Secondary | ICD-10-CM | POA: Diagnosis not present

## 2016-01-28 DIAGNOSIS — Z9181 History of falling: Secondary | ICD-10-CM | POA: Diagnosis not present

## 2016-01-28 DIAGNOSIS — I2510 Atherosclerotic heart disease of native coronary artery without angina pectoris: Secondary | ICD-10-CM | POA: Diagnosis not present

## 2016-01-28 DIAGNOSIS — I509 Heart failure, unspecified: Secondary | ICD-10-CM | POA: Diagnosis not present

## 2016-01-28 DIAGNOSIS — E785 Hyperlipidemia, unspecified: Secondary | ICD-10-CM | POA: Diagnosis not present

## 2016-01-28 DIAGNOSIS — M810 Age-related osteoporosis without current pathological fracture: Secondary | ICD-10-CM | POA: Diagnosis not present

## 2016-01-28 DIAGNOSIS — K219 Gastro-esophageal reflux disease without esophagitis: Secondary | ICD-10-CM | POA: Diagnosis not present

## 2016-01-28 DIAGNOSIS — J449 Chronic obstructive pulmonary disease, unspecified: Secondary | ICD-10-CM | POA: Diagnosis not present

## 2016-01-28 DIAGNOSIS — I480 Paroxysmal atrial fibrillation: Secondary | ICD-10-CM | POA: Diagnosis not present

## 2016-01-28 DIAGNOSIS — S7291XD Unspecified fracture of right femur, subsequent encounter for closed fracture with routine healing: Secondary | ICD-10-CM | POA: Diagnosis not present

## 2016-01-29 ENCOUNTER — Encounter (INDEPENDENT_AMBULATORY_CARE_PROVIDER_SITE_OTHER): Payer: Self-pay | Admitting: Orthopaedic Surgery

## 2016-01-29 ENCOUNTER — Ambulatory Visit (INDEPENDENT_AMBULATORY_CARE_PROVIDER_SITE_OTHER): Payer: Medicare Other

## 2016-01-29 ENCOUNTER — Ambulatory Visit (INDEPENDENT_AMBULATORY_CARE_PROVIDER_SITE_OTHER): Payer: Medicare Other | Admitting: Orthopaedic Surgery

## 2016-01-29 DIAGNOSIS — E039 Hypothyroidism, unspecified: Secondary | ICD-10-CM | POA: Diagnosis not present

## 2016-01-29 DIAGNOSIS — Z9181 History of falling: Secondary | ICD-10-CM | POA: Diagnosis not present

## 2016-01-29 DIAGNOSIS — K219 Gastro-esophageal reflux disease without esophagitis: Secondary | ICD-10-CM | POA: Diagnosis not present

## 2016-01-29 DIAGNOSIS — I509 Heart failure, unspecified: Secondary | ICD-10-CM | POA: Diagnosis not present

## 2016-01-29 DIAGNOSIS — M810 Age-related osteoporosis without current pathological fracture: Secondary | ICD-10-CM | POA: Diagnosis not present

## 2016-01-29 DIAGNOSIS — E785 Hyperlipidemia, unspecified: Secondary | ICD-10-CM | POA: Diagnosis not present

## 2016-01-29 DIAGNOSIS — I2510 Atherosclerotic heart disease of native coronary artery without angina pectoris: Secondary | ICD-10-CM | POA: Diagnosis not present

## 2016-01-29 DIAGNOSIS — R339 Retention of urine, unspecified: Secondary | ICD-10-CM | POA: Diagnosis not present

## 2016-01-29 DIAGNOSIS — S72002D Fracture of unspecified part of neck of left femur, subsequent encounter for closed fracture with routine healing: Secondary | ICD-10-CM | POA: Diagnosis not present

## 2016-01-29 DIAGNOSIS — S7291XD Unspecified fracture of right femur, subsequent encounter for closed fracture with routine healing: Secondary | ICD-10-CM | POA: Diagnosis not present

## 2016-01-29 DIAGNOSIS — I480 Paroxysmal atrial fibrillation: Secondary | ICD-10-CM | POA: Diagnosis not present

## 2016-01-29 DIAGNOSIS — J449 Chronic obstructive pulmonary disease, unspecified: Secondary | ICD-10-CM | POA: Diagnosis not present

## 2016-01-29 DIAGNOSIS — D509 Iron deficiency anemia, unspecified: Secondary | ICD-10-CM | POA: Diagnosis not present

## 2016-01-29 NOTE — Progress Notes (Signed)
Office Visit Note   Patient: Danielle [name]           Date of Birth: [birth year]           MRN: [id number] Visit Date: 01/29/2016              Requested by: Glendale Chard, MD [address] [place], [place] [address] PCP: Maximino Greenland, MD   Assessment & Plan: Visit Diagnoses:  1. Closed fracture of left hip with routine healing, subsequent encounter     Plan:  - patient is progressing well - f/u 6 weeks repeat left hip xrays - continue with strengthening and HEP  Follow-Up Instructions: Return in about 6 weeks (around 03/11/2016).   Orders:  Orders Placed This Encounter  Procedures  . XR HIP UNILAT W OR W/O PELVIS 2-3 VIEWS LEFT   No orders of the defined types were placed in this encounter.     Procedures: No procedures performed   Clinical Data: No additional findings.   Subjective: Chief Complaint  Patient presents with  . Left Hip - Pain, Follow-up    HPI 6 week postop visit for left hip fx s/p IM nail.  Walking with walker at home.  Denies significant pain, just some discomfort.  Doing much better now.   Review of Systems   Objective: Vital Signs: There were no vitals taken for this visit.  Physical Exam  Ortho Exam Good ROM of hip without pain Specialty Comments:  No specialty comments available.  Imaging: Xr Hip Unilat W Or W/o Pelvis 2-3 Views Left  Result Date: 01/29/2016 Healed basicervical fracture.  Intact hardware.  No interval displacement or worsening.    PMFS History: Patient Active Problem List   Diagnosis Date Noted  . Thrombocytopenia ([place]) 12/12/2015  . Anemia 12/12/2015  . Fever 12/12/2015  . Vitamin D deficiency 12/12/2015  . Acute urinary retention 12/11/2015  . Osteoporosis 12/11/2015  . Hip fracture ([place]) 12/10/2015  . Aortic atherosclerosis ([place]) 12/10/2015  . Lung blebs ([place]) 12/03/2015  . Pelvic fracture ([place]) 08/19/2015  . Fall at home 08/19/2015  . Hyperlipidemia 08/19/2015  .  Asthma 08/19/2015  . Atrial fibrillation ([place]) 08/19/2015  . Hyperthyroidism 08/19/2015  . Chronic systolic CHF (congestive heart failure) ([place])   . 3-vessel CAD   . Aortic stenosis    Past Medical History:  Diagnosis Date  . Aortic stenosis    Status post bioprosthetic AVR  . Arthritis    DJD  . Asthma   . Chronic systolic CHF (congestive heart failure) (H[facility])    EF 30% 04/2014  . Coronary artery disease   . Gastritis   . Hyperlipidemia   . Hypertension   . Hyperthyroidism   . Multiple thyroid nodules   . Pelvis fracture ([place]) 08/19/2015   MULTIPLE   . Spontaneous pneumothorax 11/28/2015   left   . Stroke [facility])     Family History  Problem Relation Age of Onset  . Diabetes Mother   . Heart attack Mother   . Diabetes Father   . Lung cancer Father   . Diabetes Sister   . Diabetes Sister   . HIV Brother     Past Surgical History:  Procedure Laterality Date  . CHEST TUBE INSERTION  10/2015  . CORONARY ARTERY BYPASS GRAFT  04/2014  . INTRAMEDULLARY (IM) NAIL INTERTROCHANTERIC Left 12/10/2015   Procedure: INTRAMEDULLARY (IM) NAIL INTERTROCHANTRIC;  Surgeon: Leandrew Koyanagi, MD;  Location: [place];  Service: Orthopedics;  Laterality: Left;  .  PLEURADESIS Left 12/03/2015   Procedure: PLEURADESIS;  Surgeon: Melrose Nakayama, MD;  Location: [place];  Service: Thoracic;  Laterality: Left;  . RESECTION OF APICAL BLEB Left 12/03/2015   Procedure: BLEBECTOMY;  Surgeon: Melrose Nakayama, MD;  Location: [place];  Service: Thoracic;  Laterality: Left;  . THORACOSCOPY  12/03/2015  . VALVE REPLACEMENT    . VIDEO ASSISTED THORACOSCOPY Left 12/03/2015   Procedure: VIDEO ASSISTED THORACOSCOPY;  Surgeon: Melrose Nakayama, MD;  Location: [place];  Service: Thoracic;  Laterality: Left;   Social History   Occupational History  . Retired in 2004    Social History Main Topics  . Smoking status: Former Smoker    Quit date: 03/01/2010  . Smokeless tobacco: Never Used  . Alcohol use No  .  Drug use: No  . Sexual activity: Not on file

## 2016-01-30 DIAGNOSIS — I2510 Atherosclerotic heart disease of native coronary artery without angina pectoris: Secondary | ICD-10-CM | POA: Diagnosis not present

## 2016-01-30 DIAGNOSIS — I480 Paroxysmal atrial fibrillation: Secondary | ICD-10-CM | POA: Diagnosis not present

## 2016-01-30 DIAGNOSIS — D509 Iron deficiency anemia, unspecified: Secondary | ICD-10-CM | POA: Diagnosis not present

## 2016-01-30 DIAGNOSIS — M810 Age-related osteoporosis without current pathological fracture: Secondary | ICD-10-CM | POA: Diagnosis not present

## 2016-01-30 DIAGNOSIS — K219 Gastro-esophageal reflux disease without esophagitis: Secondary | ICD-10-CM | POA: Diagnosis not present

## 2016-01-30 DIAGNOSIS — Z9181 History of falling: Secondary | ICD-10-CM | POA: Diagnosis not present

## 2016-01-30 DIAGNOSIS — J449 Chronic obstructive pulmonary disease, unspecified: Secondary | ICD-10-CM | POA: Diagnosis not present

## 2016-01-30 DIAGNOSIS — I509 Heart failure, unspecified: Secondary | ICD-10-CM | POA: Diagnosis not present

## 2016-01-30 DIAGNOSIS — E039 Hypothyroidism, unspecified: Secondary | ICD-10-CM | POA: Diagnosis not present

## 2016-01-30 DIAGNOSIS — S7291XD Unspecified fracture of right femur, subsequent encounter for closed fracture with routine healing: Secondary | ICD-10-CM | POA: Diagnosis not present

## 2016-01-30 DIAGNOSIS — R339 Retention of urine, unspecified: Secondary | ICD-10-CM | POA: Diagnosis not present

## 2016-01-30 DIAGNOSIS — E785 Hyperlipidemia, unspecified: Secondary | ICD-10-CM | POA: Diagnosis not present

## 2016-01-31 DIAGNOSIS — R269 Unspecified abnormalities of gait and mobility: Secondary | ICD-10-CM | POA: Diagnosis not present

## 2016-01-31 DIAGNOSIS — J45909 Unspecified asthma, uncomplicated: Secondary | ICD-10-CM | POA: Diagnosis not present

## 2016-02-02 ENCOUNTER — Other Ambulatory Visit: Payer: Self-pay

## 2016-02-02 DIAGNOSIS — E785 Hyperlipidemia, unspecified: Secondary | ICD-10-CM | POA: Diagnosis not present

## 2016-02-02 DIAGNOSIS — S7291XD Unspecified fracture of right femur, subsequent encounter for closed fracture with routine healing: Secondary | ICD-10-CM | POA: Diagnosis not present

## 2016-02-02 DIAGNOSIS — D509 Iron deficiency anemia, unspecified: Secondary | ICD-10-CM | POA: Diagnosis not present

## 2016-02-02 DIAGNOSIS — M810 Age-related osteoporosis without current pathological fracture: Secondary | ICD-10-CM | POA: Diagnosis not present

## 2016-02-02 DIAGNOSIS — R339 Retention of urine, unspecified: Secondary | ICD-10-CM | POA: Diagnosis not present

## 2016-02-02 DIAGNOSIS — I480 Paroxysmal atrial fibrillation: Secondary | ICD-10-CM | POA: Diagnosis not present

## 2016-02-02 DIAGNOSIS — J449 Chronic obstructive pulmonary disease, unspecified: Secondary | ICD-10-CM | POA: Diagnosis not present

## 2016-02-02 DIAGNOSIS — I509 Heart failure, unspecified: Secondary | ICD-10-CM | POA: Diagnosis not present

## 2016-02-02 DIAGNOSIS — I2510 Atherosclerotic heart disease of native coronary artery without angina pectoris: Secondary | ICD-10-CM | POA: Diagnosis not present

## 2016-02-02 DIAGNOSIS — Z9181 History of falling: Secondary | ICD-10-CM | POA: Diagnosis not present

## 2016-02-02 DIAGNOSIS — E039 Hypothyroidism, unspecified: Secondary | ICD-10-CM | POA: Diagnosis not present

## 2016-02-02 DIAGNOSIS — K219 Gastro-esophageal reflux disease without esophagitis: Secondary | ICD-10-CM | POA: Diagnosis not present

## 2016-02-02 NOTE — Patient Outreach (Signed)
[facility] [facility]) Care Management  02/02/2016   Danielle [name] 02-09-1946 [id number]  Subjective:  I am still working with the home health agencies in my home. I am able to walk with my walker much longer now.  Objective:  Telephone encounter  Current Medications:  Current Outpatient Prescriptions  Medication Sig Dispense Refill  . albuterol (PROAIR HFA) 108 (90 Base) MCG/ACT inhaler Inhale 2 puffs into the lungs every 6 (six) hours as needed for wheezing or shortness of breath.     Marland Kitchen alendronate (FOSAMAX) 70 MG tablet Take 1 tablet (70 mg total) by mouth once a week. Take with a full glass of water on an empty stomach. 4 tablet 0  . aspirin EC 81 MG tablet Take 81 mg by mouth daily.    Marland Kitchen atorvastatin (LIPITOR) 40 MG tablet Take 40 mg by mouth at bedtime.    . budesonide-formoterol (SYMBICORT) 160-4.5 MCG/ACT inhaler Inhale 2 puffs into the lungs 2 (two) times daily.    . carvedilol (COREG) 6.25 MG tablet Take 1 tablet (6.25 mg total) by mouth 2 (two) times daily with a meal. 60 tablet 1  . cephALEXin (KEFLEX) 500 MG capsule Take 1 capsule (500 mg total) by mouth 2 (two) times daily. Stop date 10/18 4 capsule 0  . Cholecalciferol (VITAMIN D3) 400 units tablet Take 1 tablet (400 Units total) by mouth daily. 30 tablet 0  . HYDROcodone-acetaminophen (NORCO) 10-325 MG tablet Take 1-2 tablets by mouth every 6 (six) hours as needed for moderate pain or severe pain. 90 tablet 0  . methimazole (TAPAZOLE) 5 MG tablet Take 15 mg by mouth every evening.    . methocarbamol (ROBAXIN) 500 MG tablet Take 1 tablet (500 mg total) by mouth every 6 (six) hours as needed for muscle spasms. 30 tablet 0  . oxyCODONE (OXY IR/ROXICODONE) 5 MG immediate release tablet Take 1 tablet (5 mg total) by mouth every 4 (four) hours as needed for breakthrough pain ((for MODERATE breakthrough pain)). 30 tablet 0  . ranitidine (ZANTAC) 150 MG tablet Take 150 mg by mouth 2 (two) times daily.    Marland Kitchen  senna-docusate (SENOKOT-S) 8.6-50 MG tablet Take 1 tablet by mouth 2 (two) times daily.     Marland Kitchen warfarin (COUMADIN) 5 MG tablet Take 1 tablet (5 mg total) by mouth daily at 6 PM. 30 tablet 1  . warfarin (COUMADIN) 5 MG tablet Take 1 tablet (5 mg total) by mouth every evening. 30 tablet 0   No current facility-administered medications for this visit.     Functional Status:  In your present state of health, do you have any difficulty performing the following activities: 01/02/2016 12/23/2015  Hearing? Y N  Vision? N N  Difficulty concentrating or making decisions? N N  Walking or climbing stairs? N Y  Dressing or bathing? Y Y  Doing errands, shopping? [name]  Preparing Food and eating ? N N  Using the Toilet? Y Y  In the past six months, have you accidently leaked urine? Y N  Do you have problems with loss of bowel control? Y N  Managing your Medications? N N  Managing your Finances? N N  Housekeeping or managing your Housekeeping? [name]    Fall/Depression Screening: PHQ 2/9 Scores 01/02/2016 12/23/2015  PHQ - 2 Score 0 0    THN CM Care Plan Problem One   Flowsheet Row Most Recent Value  Care Plan Problem One  (P) Patient recently discharged from a skilled nursing facility after  being discharged from acute care  Role Documenting the Problem One  (P) Care Management [place] for Problem One  (P) Active  THN Long Term Goal (31-90 days)  (P) in the next 31 days, patient will have no acute care admissions for heart failure  THN Long Term Goal Start Date  (P) 01/12/16  Interventions for Problem One Long Term Goal  (P) telephone call to assess patient progress in meeting her case management goals.  Patient reports no acute care admission. patient admit to being compliant with working with HHPT/OT and HHRN.    [facility] CM Short Term Goal #1 (0-30 days)  (P) Patient will receive appropriate resources needed to transition safefly to home  [facility] CM Short Term Goal #1 Start Date  (P) 01/02/16   THN CM Short Term Goal #1 Met Date  (P) 01/09/16  Interventions for Short Term Goal #1  (P) telephonmn    THN CM Care Plan Problem Two   Flowsheet Row Most Recent Value  Care Plan Problem Two  (P) patient has multiple chronic illnesses  Role Documenting the Problem Two  (P) Care Management [place] for Problem Two  (P) Active  Interventions for Problem Two Long Term Goal   (P) telephone contact to assess need for community care coordination.  Patient states she is very pleased with [facility],   THN Long Term Goal (31-90) days  (P) In the next 31 days  patient will have no acute care admissions  [facility] Long Term Goal Start Date  (P) 01/02/16      Assessment:  Patient is progressing towards meeting her case management goals. Patient states her pain is much better.  Plan:  Telephone contact later this month for assessment of progess and possible case closure

## 2016-02-03 ENCOUNTER — Other Ambulatory Visit: Payer: Self-pay

## 2016-02-03 ENCOUNTER — Telehealth: Payer: Self-pay

## 2016-02-03 DIAGNOSIS — E785 Hyperlipidemia, unspecified: Secondary | ICD-10-CM | POA: Diagnosis not present

## 2016-02-03 DIAGNOSIS — M810 Age-related osteoporosis without current pathological fracture: Secondary | ICD-10-CM | POA: Diagnosis not present

## 2016-02-03 DIAGNOSIS — I2510 Atherosclerotic heart disease of native coronary artery without angina pectoris: Secondary | ICD-10-CM | POA: Diagnosis not present

## 2016-02-03 DIAGNOSIS — D509 Iron deficiency anemia, unspecified: Secondary | ICD-10-CM | POA: Diagnosis not present

## 2016-02-03 DIAGNOSIS — S7291XD Unspecified fracture of right femur, subsequent encounter for closed fracture with routine healing: Secondary | ICD-10-CM | POA: Diagnosis not present

## 2016-02-03 DIAGNOSIS — K219 Gastro-esophageal reflux disease without esophagitis: Secondary | ICD-10-CM | POA: Diagnosis not present

## 2016-02-03 DIAGNOSIS — Z9181 History of falling: Secondary | ICD-10-CM | POA: Diagnosis not present

## 2016-02-03 DIAGNOSIS — I509 Heart failure, unspecified: Secondary | ICD-10-CM | POA: Diagnosis not present

## 2016-02-03 DIAGNOSIS — R339 Retention of urine, unspecified: Secondary | ICD-10-CM | POA: Diagnosis not present

## 2016-02-03 DIAGNOSIS — E039 Hypothyroidism, unspecified: Secondary | ICD-10-CM | POA: Diagnosis not present

## 2016-02-03 DIAGNOSIS — J449 Chronic obstructive pulmonary disease, unspecified: Secondary | ICD-10-CM | POA: Diagnosis not present

## 2016-02-03 DIAGNOSIS — I480 Paroxysmal atrial fibrillation: Secondary | ICD-10-CM | POA: Diagnosis not present

## 2016-02-03 NOTE — Telephone Encounter (Signed)
SEND NOTES TO SCHEDULING

## 2016-02-05 DIAGNOSIS — Z23 Encounter for immunization: Secondary | ICD-10-CM | POA: Diagnosis not present

## 2016-02-05 DIAGNOSIS — R339 Retention of urine, unspecified: Secondary | ICD-10-CM | POA: Diagnosis not present

## 2016-02-05 DIAGNOSIS — E039 Hypothyroidism, unspecified: Secondary | ICD-10-CM | POA: Diagnosis not present

## 2016-02-05 DIAGNOSIS — I480 Paroxysmal atrial fibrillation: Secondary | ICD-10-CM | POA: Diagnosis not present

## 2016-02-05 DIAGNOSIS — M84459S Pathological fracture, hip, unspecified, sequela: Secondary | ICD-10-CM | POA: Diagnosis not present

## 2016-02-05 DIAGNOSIS — Z9181 History of falling: Secondary | ICD-10-CM | POA: Diagnosis not present

## 2016-02-05 DIAGNOSIS — Z7901 Long term (current) use of anticoagulants: Secondary | ICD-10-CM | POA: Diagnosis not present

## 2016-02-05 DIAGNOSIS — J449 Chronic obstructive pulmonary disease, unspecified: Secondary | ICD-10-CM | POA: Diagnosis not present

## 2016-02-05 DIAGNOSIS — E785 Hyperlipidemia, unspecified: Secondary | ICD-10-CM | POA: Diagnosis not present

## 2016-02-05 DIAGNOSIS — I2510 Atherosclerotic heart disease of native coronary artery without angina pectoris: Secondary | ICD-10-CM | POA: Diagnosis not present

## 2016-02-05 DIAGNOSIS — S7291XD Unspecified fracture of right femur, subsequent encounter for closed fracture with routine healing: Secondary | ICD-10-CM | POA: Diagnosis not present

## 2016-02-05 DIAGNOSIS — I10 Essential (primary) hypertension: Secondary | ICD-10-CM | POA: Diagnosis not present

## 2016-02-05 DIAGNOSIS — D509 Iron deficiency anemia, unspecified: Secondary | ICD-10-CM | POA: Diagnosis not present

## 2016-02-05 DIAGNOSIS — K219 Gastro-esophageal reflux disease without esophagitis: Secondary | ICD-10-CM | POA: Diagnosis not present

## 2016-02-05 DIAGNOSIS — I509 Heart failure, unspecified: Secondary | ICD-10-CM | POA: Diagnosis not present

## 2016-02-05 DIAGNOSIS — M810 Age-related osteoporosis without current pathological fracture: Secondary | ICD-10-CM | POA: Diagnosis not present

## 2016-02-06 DIAGNOSIS — D509 Iron deficiency anemia, unspecified: Secondary | ICD-10-CM | POA: Diagnosis not present

## 2016-02-06 DIAGNOSIS — I509 Heart failure, unspecified: Secondary | ICD-10-CM | POA: Diagnosis not present

## 2016-02-06 DIAGNOSIS — K219 Gastro-esophageal reflux disease without esophagitis: Secondary | ICD-10-CM | POA: Diagnosis not present

## 2016-02-06 DIAGNOSIS — S7291XD Unspecified fracture of right femur, subsequent encounter for closed fracture with routine healing: Secondary | ICD-10-CM | POA: Diagnosis not present

## 2016-02-06 DIAGNOSIS — I480 Paroxysmal atrial fibrillation: Secondary | ICD-10-CM | POA: Diagnosis not present

## 2016-02-06 DIAGNOSIS — I2510 Atherosclerotic heart disease of native coronary artery without angina pectoris: Secondary | ICD-10-CM | POA: Diagnosis not present

## 2016-02-06 DIAGNOSIS — E785 Hyperlipidemia, unspecified: Secondary | ICD-10-CM | POA: Diagnosis not present

## 2016-02-06 DIAGNOSIS — R339 Retention of urine, unspecified: Secondary | ICD-10-CM | POA: Diagnosis not present

## 2016-02-06 DIAGNOSIS — M810 Age-related osteoporosis without current pathological fracture: Secondary | ICD-10-CM | POA: Diagnosis not present

## 2016-02-06 DIAGNOSIS — E039 Hypothyroidism, unspecified: Secondary | ICD-10-CM | POA: Diagnosis not present

## 2016-02-06 DIAGNOSIS — Z9181 History of falling: Secondary | ICD-10-CM | POA: Diagnosis not present

## 2016-02-06 DIAGNOSIS — J449 Chronic obstructive pulmonary disease, unspecified: Secondary | ICD-10-CM | POA: Diagnosis not present

## 2016-02-07 NOTE — Patient Outreach (Signed)
    Unsuccessful attempt made to contact patient via telephone  Plan: Make telephone contact later this mont for communityc are coordination

## 2016-02-10 DIAGNOSIS — K219 Gastro-esophageal reflux disease without esophagitis: Secondary | ICD-10-CM | POA: Diagnosis not present

## 2016-02-10 DIAGNOSIS — S7291XD Unspecified fracture of right femur, subsequent encounter for closed fracture with routine healing: Secondary | ICD-10-CM | POA: Diagnosis not present

## 2016-02-10 DIAGNOSIS — J449 Chronic obstructive pulmonary disease, unspecified: Secondary | ICD-10-CM | POA: Diagnosis not present

## 2016-02-10 DIAGNOSIS — R339 Retention of urine, unspecified: Secondary | ICD-10-CM | POA: Diagnosis not present

## 2016-02-10 DIAGNOSIS — I509 Heart failure, unspecified: Secondary | ICD-10-CM | POA: Diagnosis not present

## 2016-02-10 DIAGNOSIS — Z9181 History of falling: Secondary | ICD-10-CM | POA: Diagnosis not present

## 2016-02-10 DIAGNOSIS — I2510 Atherosclerotic heart disease of native coronary artery without angina pectoris: Secondary | ICD-10-CM | POA: Diagnosis not present

## 2016-02-10 DIAGNOSIS — E785 Hyperlipidemia, unspecified: Secondary | ICD-10-CM | POA: Diagnosis not present

## 2016-02-10 DIAGNOSIS — I480 Paroxysmal atrial fibrillation: Secondary | ICD-10-CM | POA: Diagnosis not present

## 2016-02-10 DIAGNOSIS — E039 Hypothyroidism, unspecified: Secondary | ICD-10-CM | POA: Diagnosis not present

## 2016-02-10 DIAGNOSIS — M810 Age-related osteoporosis without current pathological fracture: Secondary | ICD-10-CM | POA: Diagnosis not present

## 2016-02-10 DIAGNOSIS — D509 Iron deficiency anemia, unspecified: Secondary | ICD-10-CM | POA: Diagnosis not present

## 2016-02-11 ENCOUNTER — Other Ambulatory Visit: Payer: Self-pay

## 2016-02-11 DIAGNOSIS — K219 Gastro-esophageal reflux disease without esophagitis: Secondary | ICD-10-CM | POA: Diagnosis not present

## 2016-02-11 DIAGNOSIS — I509 Heart failure, unspecified: Secondary | ICD-10-CM | POA: Diagnosis not present

## 2016-02-11 DIAGNOSIS — D509 Iron deficiency anemia, unspecified: Secondary | ICD-10-CM | POA: Diagnosis not present

## 2016-02-11 DIAGNOSIS — J449 Chronic obstructive pulmonary disease, unspecified: Secondary | ICD-10-CM | POA: Diagnosis not present

## 2016-02-11 DIAGNOSIS — I2510 Atherosclerotic heart disease of native coronary artery without angina pectoris: Secondary | ICD-10-CM | POA: Diagnosis not present

## 2016-02-11 DIAGNOSIS — E039 Hypothyroidism, unspecified: Secondary | ICD-10-CM | POA: Diagnosis not present

## 2016-02-11 DIAGNOSIS — S7291XD Unspecified fracture of right femur, subsequent encounter for closed fracture with routine healing: Secondary | ICD-10-CM | POA: Diagnosis not present

## 2016-02-11 DIAGNOSIS — E785 Hyperlipidemia, unspecified: Secondary | ICD-10-CM | POA: Diagnosis not present

## 2016-02-11 DIAGNOSIS — R339 Retention of urine, unspecified: Secondary | ICD-10-CM | POA: Diagnosis not present

## 2016-02-11 DIAGNOSIS — M810 Age-related osteoporosis without current pathological fracture: Secondary | ICD-10-CM | POA: Diagnosis not present

## 2016-02-11 DIAGNOSIS — Z9181 History of falling: Secondary | ICD-10-CM | POA: Diagnosis not present

## 2016-02-11 DIAGNOSIS — I480 Paroxysmal atrial fibrillation: Secondary | ICD-10-CM | POA: Diagnosis not present

## 2016-02-12 DIAGNOSIS — J449 Chronic obstructive pulmonary disease, unspecified: Secondary | ICD-10-CM | POA: Diagnosis not present

## 2016-02-12 DIAGNOSIS — K219 Gastro-esophageal reflux disease without esophagitis: Secondary | ICD-10-CM | POA: Diagnosis not present

## 2016-02-12 DIAGNOSIS — I2510 Atherosclerotic heart disease of native coronary artery without angina pectoris: Secondary | ICD-10-CM | POA: Diagnosis not present

## 2016-02-12 DIAGNOSIS — R339 Retention of urine, unspecified: Secondary | ICD-10-CM | POA: Diagnosis not present

## 2016-02-12 DIAGNOSIS — S7291XD Unspecified fracture of right femur, subsequent encounter for closed fracture with routine healing: Secondary | ICD-10-CM | POA: Diagnosis not present

## 2016-02-12 DIAGNOSIS — I509 Heart failure, unspecified: Secondary | ICD-10-CM | POA: Diagnosis not present

## 2016-02-12 DIAGNOSIS — E039 Hypothyroidism, unspecified: Secondary | ICD-10-CM | POA: Diagnosis not present

## 2016-02-12 DIAGNOSIS — I480 Paroxysmal atrial fibrillation: Secondary | ICD-10-CM | POA: Diagnosis not present

## 2016-02-12 DIAGNOSIS — M810 Age-related osteoporosis without current pathological fracture: Secondary | ICD-10-CM | POA: Diagnosis not present

## 2016-02-12 DIAGNOSIS — D509 Iron deficiency anemia, unspecified: Secondary | ICD-10-CM | POA: Diagnosis not present

## 2016-02-12 DIAGNOSIS — E785 Hyperlipidemia, unspecified: Secondary | ICD-10-CM | POA: Diagnosis not present

## 2016-02-12 DIAGNOSIS — Z9181 History of falling: Secondary | ICD-10-CM | POA: Diagnosis not present

## 2016-02-12 NOTE — Patient Outreach (Signed)
[facility] ([facility]) Care Management  02/11/2016   Kaydan Brazell 12/26/1945 [id number]  Subjective:  I am doing much better than what when we started together.  Objective:  Telephonic encounter  Current Medications:  Current Outpatient Prescriptions  Medication Sig Dispense Refill  . albuterol (PROAIR HFA) 108 (90 Base) MCG/ACT inhaler Inhale 2 puffs into the lungs every 6 (six) hours as needed for wheezing or shortness of breath.     . alendronate (FOSAMAX) 70 MG tablet Take 1 tablet (70 mg total) by mouth once a week. Take with a full glass of water on an empty stomach. 4 tablet 0  . aspirin EC 81 MG tablet Take 81 mg by mouth daily.    . atorvastatin (LIPITOR) 40 MG tablet Take 40 mg by mouth at bedtime.    . budesonide-formoterol (SYMBICORT) 160-4.5 MCG/ACT inhaler Inhale 2 puffs into the lungs 2 (two) times daily.    . carvedilol (COREG) 6.25 MG tablet Take 1 tablet (6.25 mg total) by mouth 2 (two) times daily with a meal. 60 tablet 1  . cephALEXin (KEFLEX) 500 MG capsule Take 1 capsule (500 mg total) by mouth 2 (two) times daily. Stop date 10/18 4 capsule 0  . Cholecalciferol (VITAMIN D3) 400 units tablet Take 1 tablet (400 Units total) by mouth daily. 30 tablet 0  . HYDROcodone-acetaminophen (NORCO) 10-325 MG tablet Take 1-2 tablets by mouth every 6 (six) hours as needed for moderate pain or severe pain. 90 tablet 0  . methimazole (TAPAZOLE) 5 MG tablet Take 15 mg by mouth every evening.    . methocarbamol (ROBAXIN) 500 MG tablet Take 1 tablet (500 mg total) by mouth every 6 (six) hours as needed for muscle spasms. 30 tablet 0  . oxyCODONE (OXY IR/ROXICODONE) 5 MG immediate release tablet Take 1 tablet (5 mg total) by mouth every 4 (four) hours as needed for breakthrough pain ((for MODERATE breakthrough pain)). 30 tablet 0  . ranitidine (ZANTAC) 150 MG tablet Take 150 mg by mouth 2 (two) times daily.    . senna-docusate (SENOKOT-S) 8.6-50 MG tablet Take 1  tablet by mouth 2 (two) times daily.     . warfarin (COUMADIN) 5 MG tablet Take 1 tablet (5 mg total) by mouth daily at 6 PM. 30 tablet 1  . warfarin (COUMADIN) 5 MG tablet Take 1 tablet (5 mg total) by mouth every evening. 30 tablet 0   No current facility-administered medications for this visit.     Functional Status:  In your present state of health, do you have any difficulty performing the following activities: 01/02/2016 12/23/2015  Hearing? Y N  Vision? N N  Difficulty concentrating or making decisions? N N  Walking or climbing stairs? N Y  Dressing or bathing? Y Y  Doing errands, shopping? Y Y  Preparing Food and eating ? N N  Using the Toilet? Y Y  In the past six months, have you accidently leaked urine? Y N  Do you have problems with loss of bowel control? Y N  Managing your Medications? N N  Managing your Finances? N N  Housekeeping or managing your Housekeeping? Y Y    Fall/Depression Screening: PHQ 2/9 Scores 01/02/2016 12/23/2015  PHQ - 2 Score 0 0   [facility] CM Care Plan Problem One   Flowsheet Row Most Recent Value  Care Plan Problem One  Patient recently discharged from a skilled nursing facility after being discharged from acute care  Role Documenting the Problem One  Care Management   Coordinator  Care Plan for Problem One  Active  [facility] Long Term Goal (31-90 days)  in the next 31 days, patient will have no acute care admissions for heart failure  [facility] Long Term Goal Start Date  01/12/16  [facility] Long Term Goal Met Date  02/11/16  Interventions for Problem One Long Term Goal  telephone call to assess patient progress in meeting her case management goals.  Patient reports no acute care admission. patient admit to being compliant with working with HHPT/OT and HHRN.    [facility] CM Short Term Goal #1 (0-30 days)  Patient will receive appropriate resources needed to transition safefly to home  [facility] CM Short Term Goal #1 Start Date  01/02/16  [facility] CM Short Term Goal #1 Met Date   01/09/16  Interventions for Short Term Goal #1  telephonmn    [facility] CM Care Plan Problem Two   Flowsheet Row Most Recent Value  Care Plan Problem Two  patient has multiple chronic illnesses  Role Documenting the Problem Two  Care Management [place] for Problem Two  Active  Interventions for Problem Two Long Term Goal   telephone contact to assess need for community care coordination.  Patient states she is very pleased with [facility],   [facility] Long Term Goal (31-90) days  In the next 31 days  patient will have no acute care admissions  [facility] Long Term Goal Start Date  01/02/16  [facility] Long Term Goal Met Date  02/11/16     Assessment:  Patient has met her case management goals  Patient has contact information for [facility] Case Management, understands she can self refer if more case management goals need arise.  Plan:  Discharge from caseload as patient has met his case management goals

## 2016-02-13 DIAGNOSIS — I509 Heart failure, unspecified: Secondary | ICD-10-CM | POA: Diagnosis not present

## 2016-02-13 DIAGNOSIS — R339 Retention of urine, unspecified: Secondary | ICD-10-CM | POA: Diagnosis not present

## 2016-02-13 DIAGNOSIS — E039 Hypothyroidism, unspecified: Secondary | ICD-10-CM | POA: Diagnosis not present

## 2016-02-13 DIAGNOSIS — K219 Gastro-esophageal reflux disease without esophagitis: Secondary | ICD-10-CM | POA: Diagnosis not present

## 2016-02-13 DIAGNOSIS — J449 Chronic obstructive pulmonary disease, unspecified: Secondary | ICD-10-CM | POA: Diagnosis not present

## 2016-02-13 DIAGNOSIS — E785 Hyperlipidemia, unspecified: Secondary | ICD-10-CM | POA: Diagnosis not present

## 2016-02-13 DIAGNOSIS — I2510 Atherosclerotic heart disease of native coronary artery without angina pectoris: Secondary | ICD-10-CM | POA: Diagnosis not present

## 2016-02-13 DIAGNOSIS — I480 Paroxysmal atrial fibrillation: Secondary | ICD-10-CM | POA: Diagnosis not present

## 2016-02-13 DIAGNOSIS — D509 Iron deficiency anemia, unspecified: Secondary | ICD-10-CM | POA: Diagnosis not present

## 2016-02-13 DIAGNOSIS — S7291XD Unspecified fracture of right femur, subsequent encounter for closed fracture with routine healing: Secondary | ICD-10-CM | POA: Diagnosis not present

## 2016-02-13 DIAGNOSIS — Z9181 History of falling: Secondary | ICD-10-CM | POA: Diagnosis not present

## 2016-02-13 DIAGNOSIS — M810 Age-related osteoporosis without current pathological fracture: Secondary | ICD-10-CM | POA: Diagnosis not present

## 2016-02-16 DIAGNOSIS — E785 Hyperlipidemia, unspecified: Secondary | ICD-10-CM | POA: Diagnosis not present

## 2016-02-16 DIAGNOSIS — I2510 Atherosclerotic heart disease of native coronary artery without angina pectoris: Secondary | ICD-10-CM | POA: Diagnosis not present

## 2016-02-16 DIAGNOSIS — M810 Age-related osteoporosis without current pathological fracture: Secondary | ICD-10-CM | POA: Diagnosis not present

## 2016-02-16 DIAGNOSIS — K219 Gastro-esophageal reflux disease without esophagitis: Secondary | ICD-10-CM | POA: Diagnosis not present

## 2016-02-16 DIAGNOSIS — I480 Paroxysmal atrial fibrillation: Secondary | ICD-10-CM | POA: Diagnosis not present

## 2016-02-16 DIAGNOSIS — E039 Hypothyroidism, unspecified: Secondary | ICD-10-CM | POA: Diagnosis not present

## 2016-02-16 DIAGNOSIS — S7291XD Unspecified fracture of right femur, subsequent encounter for closed fracture with routine healing: Secondary | ICD-10-CM | POA: Diagnosis not present

## 2016-02-16 DIAGNOSIS — R339 Retention of urine, unspecified: Secondary | ICD-10-CM | POA: Diagnosis not present

## 2016-02-16 DIAGNOSIS — J449 Chronic obstructive pulmonary disease, unspecified: Secondary | ICD-10-CM | POA: Diagnosis not present

## 2016-02-16 DIAGNOSIS — Z9181 History of falling: Secondary | ICD-10-CM | POA: Diagnosis not present

## 2016-02-16 DIAGNOSIS — I509 Heart failure, unspecified: Secondary | ICD-10-CM | POA: Diagnosis not present

## 2016-02-16 DIAGNOSIS — D509 Iron deficiency anemia, unspecified: Secondary | ICD-10-CM | POA: Diagnosis not present

## 2016-02-19 DIAGNOSIS — I509 Heart failure, unspecified: Secondary | ICD-10-CM | POA: Diagnosis not present

## 2016-02-19 DIAGNOSIS — K219 Gastro-esophageal reflux disease without esophagitis: Secondary | ICD-10-CM | POA: Diagnosis not present

## 2016-02-19 DIAGNOSIS — I480 Paroxysmal atrial fibrillation: Secondary | ICD-10-CM | POA: Diagnosis not present

## 2016-02-19 DIAGNOSIS — E785 Hyperlipidemia, unspecified: Secondary | ICD-10-CM | POA: Diagnosis not present

## 2016-02-19 DIAGNOSIS — I2510 Atherosclerotic heart disease of native coronary artery without angina pectoris: Secondary | ICD-10-CM | POA: Diagnosis not present

## 2016-02-19 DIAGNOSIS — R339 Retention of urine, unspecified: Secondary | ICD-10-CM | POA: Diagnosis not present

## 2016-02-19 DIAGNOSIS — M810 Age-related osteoporosis without current pathological fracture: Secondary | ICD-10-CM | POA: Diagnosis not present

## 2016-02-19 DIAGNOSIS — Z9181 History of falling: Secondary | ICD-10-CM | POA: Diagnosis not present

## 2016-02-19 DIAGNOSIS — J449 Chronic obstructive pulmonary disease, unspecified: Secondary | ICD-10-CM | POA: Diagnosis not present

## 2016-02-19 DIAGNOSIS — E039 Hypothyroidism, unspecified: Secondary | ICD-10-CM | POA: Diagnosis not present

## 2016-02-19 DIAGNOSIS — S7291XD Unspecified fracture of right femur, subsequent encounter for closed fracture with routine healing: Secondary | ICD-10-CM | POA: Diagnosis not present

## 2016-02-19 DIAGNOSIS — D509 Iron deficiency anemia, unspecified: Secondary | ICD-10-CM | POA: Diagnosis not present

## 2016-02-20 DIAGNOSIS — J449 Chronic obstructive pulmonary disease, unspecified: Secondary | ICD-10-CM | POA: Diagnosis not present

## 2016-02-20 DIAGNOSIS — K219 Gastro-esophageal reflux disease without esophagitis: Secondary | ICD-10-CM | POA: Diagnosis not present

## 2016-02-20 DIAGNOSIS — D509 Iron deficiency anemia, unspecified: Secondary | ICD-10-CM | POA: Diagnosis not present

## 2016-02-20 DIAGNOSIS — Z9181 History of falling: Secondary | ICD-10-CM | POA: Diagnosis not present

## 2016-02-20 DIAGNOSIS — R339 Retention of urine, unspecified: Secondary | ICD-10-CM | POA: Diagnosis not present

## 2016-02-20 DIAGNOSIS — M810 Age-related osteoporosis without current pathological fracture: Secondary | ICD-10-CM | POA: Diagnosis not present

## 2016-02-20 DIAGNOSIS — E039 Hypothyroidism, unspecified: Secondary | ICD-10-CM | POA: Diagnosis not present

## 2016-02-20 DIAGNOSIS — S7291XD Unspecified fracture of right femur, subsequent encounter for closed fracture with routine healing: Secondary | ICD-10-CM | POA: Diagnosis not present

## 2016-02-20 DIAGNOSIS — I2510 Atherosclerotic heart disease of native coronary artery without angina pectoris: Secondary | ICD-10-CM | POA: Diagnosis not present

## 2016-02-20 DIAGNOSIS — I480 Paroxysmal atrial fibrillation: Secondary | ICD-10-CM | POA: Diagnosis not present

## 2016-02-20 DIAGNOSIS — I509 Heart failure, unspecified: Secondary | ICD-10-CM | POA: Diagnosis not present

## 2016-02-20 DIAGNOSIS — E785 Hyperlipidemia, unspecified: Secondary | ICD-10-CM | POA: Diagnosis not present

## 2016-02-24 DIAGNOSIS — Z9181 History of falling: Secondary | ICD-10-CM | POA: Diagnosis not present

## 2016-02-24 DIAGNOSIS — I509 Heart failure, unspecified: Secondary | ICD-10-CM | POA: Diagnosis not present

## 2016-02-24 DIAGNOSIS — E039 Hypothyroidism, unspecified: Secondary | ICD-10-CM | POA: Diagnosis not present

## 2016-02-24 DIAGNOSIS — S7291XD Unspecified fracture of right femur, subsequent encounter for closed fracture with routine healing: Secondary | ICD-10-CM | POA: Diagnosis not present

## 2016-02-24 DIAGNOSIS — I480 Paroxysmal atrial fibrillation: Secondary | ICD-10-CM | POA: Diagnosis not present

## 2016-02-24 DIAGNOSIS — K219 Gastro-esophageal reflux disease without esophagitis: Secondary | ICD-10-CM | POA: Diagnosis not present

## 2016-02-24 DIAGNOSIS — M810 Age-related osteoporosis without current pathological fracture: Secondary | ICD-10-CM | POA: Diagnosis not present

## 2016-02-24 DIAGNOSIS — E785 Hyperlipidemia, unspecified: Secondary | ICD-10-CM | POA: Diagnosis not present

## 2016-02-24 DIAGNOSIS — I2510 Atherosclerotic heart disease of native coronary artery without angina pectoris: Secondary | ICD-10-CM | POA: Diagnosis not present

## 2016-02-24 DIAGNOSIS — R339 Retention of urine, unspecified: Secondary | ICD-10-CM | POA: Diagnosis not present

## 2016-02-24 DIAGNOSIS — J449 Chronic obstructive pulmonary disease, unspecified: Secondary | ICD-10-CM | POA: Diagnosis not present

## 2016-02-24 DIAGNOSIS — D509 Iron deficiency anemia, unspecified: Secondary | ICD-10-CM | POA: Diagnosis not present

## 2016-02-25 DIAGNOSIS — R339 Retention of urine, unspecified: Secondary | ICD-10-CM | POA: Diagnosis not present

## 2016-02-25 DIAGNOSIS — D509 Iron deficiency anemia, unspecified: Secondary | ICD-10-CM | POA: Diagnosis not present

## 2016-02-25 DIAGNOSIS — Z5181 Encounter for therapeutic drug level monitoring: Secondary | ICD-10-CM | POA: Diagnosis not present

## 2016-02-25 DIAGNOSIS — E039 Hypothyroidism, unspecified: Secondary | ICD-10-CM | POA: Diagnosis not present

## 2016-02-25 DIAGNOSIS — S7292XD Unspecified fracture of left femur, subsequent encounter for closed fracture with routine healing: Secondary | ICD-10-CM | POA: Diagnosis not present

## 2016-02-25 DIAGNOSIS — I480 Paroxysmal atrial fibrillation: Secondary | ICD-10-CM | POA: Diagnosis not present

## 2016-02-25 DIAGNOSIS — I2510 Atherosclerotic heart disease of native coronary artery without angina pectoris: Secondary | ICD-10-CM | POA: Diagnosis not present

## 2016-02-25 DIAGNOSIS — J449 Chronic obstructive pulmonary disease, unspecified: Secondary | ICD-10-CM | POA: Diagnosis not present

## 2016-02-25 DIAGNOSIS — E785 Hyperlipidemia, unspecified: Secondary | ICD-10-CM | POA: Diagnosis not present

## 2016-02-25 DIAGNOSIS — M810 Age-related osteoporosis without current pathological fracture: Secondary | ICD-10-CM | POA: Diagnosis not present

## 2016-02-25 DIAGNOSIS — K219 Gastro-esophageal reflux disease without esophagitis: Secondary | ICD-10-CM | POA: Diagnosis not present

## 2016-02-25 DIAGNOSIS — I509 Heart failure, unspecified: Secondary | ICD-10-CM | POA: Diagnosis not present

## 2016-02-26 DIAGNOSIS — S7291XD Unspecified fracture of right femur, subsequent encounter for closed fracture with routine healing: Secondary | ICD-10-CM | POA: Diagnosis not present

## 2016-02-26 DIAGNOSIS — M810 Age-related osteoporosis without current pathological fracture: Secondary | ICD-10-CM | POA: Diagnosis not present

## 2016-02-26 DIAGNOSIS — Z9181 History of falling: Secondary | ICD-10-CM | POA: Diagnosis not present

## 2016-02-26 DIAGNOSIS — E785 Hyperlipidemia, unspecified: Secondary | ICD-10-CM | POA: Diagnosis not present

## 2016-02-26 DIAGNOSIS — I2510 Atherosclerotic heart disease of native coronary artery without angina pectoris: Secondary | ICD-10-CM | POA: Diagnosis not present

## 2016-02-26 DIAGNOSIS — I509 Heart failure, unspecified: Secondary | ICD-10-CM | POA: Diagnosis not present

## 2016-02-26 DIAGNOSIS — J449 Chronic obstructive pulmonary disease, unspecified: Secondary | ICD-10-CM | POA: Diagnosis not present

## 2016-02-26 DIAGNOSIS — D509 Iron deficiency anemia, unspecified: Secondary | ICD-10-CM | POA: Diagnosis not present

## 2016-02-26 DIAGNOSIS — I480 Paroxysmal atrial fibrillation: Secondary | ICD-10-CM | POA: Diagnosis not present

## 2016-02-26 DIAGNOSIS — K219 Gastro-esophageal reflux disease without esophagitis: Secondary | ICD-10-CM | POA: Diagnosis not present

## 2016-02-26 DIAGNOSIS — E039 Hypothyroidism, unspecified: Secondary | ICD-10-CM | POA: Diagnosis not present

## 2016-02-26 DIAGNOSIS — R339 Retention of urine, unspecified: Secondary | ICD-10-CM | POA: Diagnosis not present

## 2016-02-27 DIAGNOSIS — I480 Paroxysmal atrial fibrillation: Secondary | ICD-10-CM | POA: Diagnosis not present

## 2016-02-27 DIAGNOSIS — J449 Chronic obstructive pulmonary disease, unspecified: Secondary | ICD-10-CM | POA: Diagnosis not present

## 2016-02-27 DIAGNOSIS — I2510 Atherosclerotic heart disease of native coronary artery without angina pectoris: Secondary | ICD-10-CM | POA: Diagnosis not present

## 2016-02-27 DIAGNOSIS — K219 Gastro-esophageal reflux disease without esophagitis: Secondary | ICD-10-CM | POA: Diagnosis not present

## 2016-02-27 DIAGNOSIS — M810 Age-related osteoporosis without current pathological fracture: Secondary | ICD-10-CM | POA: Diagnosis not present

## 2016-02-27 DIAGNOSIS — E039 Hypothyroidism, unspecified: Secondary | ICD-10-CM | POA: Diagnosis not present

## 2016-02-27 DIAGNOSIS — I509 Heart failure, unspecified: Secondary | ICD-10-CM | POA: Diagnosis not present

## 2016-02-27 DIAGNOSIS — S7291XD Unspecified fracture of right femur, subsequent encounter for closed fracture with routine healing: Secondary | ICD-10-CM | POA: Diagnosis not present

## 2016-02-27 DIAGNOSIS — R339 Retention of urine, unspecified: Secondary | ICD-10-CM | POA: Diagnosis not present

## 2016-02-27 DIAGNOSIS — D509 Iron deficiency anemia, unspecified: Secondary | ICD-10-CM | POA: Diagnosis not present

## 2016-02-27 DIAGNOSIS — Z9181 History of falling: Secondary | ICD-10-CM | POA: Diagnosis not present

## 2016-02-27 DIAGNOSIS — E785 Hyperlipidemia, unspecified: Secondary | ICD-10-CM | POA: Diagnosis not present

## 2016-03-01 DIAGNOSIS — E039 Hypothyroidism, unspecified: Secondary | ICD-10-CM | POA: Diagnosis not present

## 2016-03-01 DIAGNOSIS — I480 Paroxysmal atrial fibrillation: Secondary | ICD-10-CM | POA: Diagnosis not present

## 2016-03-01 DIAGNOSIS — I509 Heart failure, unspecified: Secondary | ICD-10-CM | POA: Diagnosis not present

## 2016-03-01 DIAGNOSIS — J449 Chronic obstructive pulmonary disease, unspecified: Secondary | ICD-10-CM | POA: Diagnosis not present

## 2016-03-01 DIAGNOSIS — K219 Gastro-esophageal reflux disease without esophagitis: Secondary | ICD-10-CM | POA: Diagnosis not present

## 2016-03-01 DIAGNOSIS — D509 Iron deficiency anemia, unspecified: Secondary | ICD-10-CM | POA: Diagnosis not present

## 2016-03-01 DIAGNOSIS — I2510 Atherosclerotic heart disease of native coronary artery without angina pectoris: Secondary | ICD-10-CM | POA: Diagnosis not present

## 2016-03-01 DIAGNOSIS — S7291XD Unspecified fracture of right femur, subsequent encounter for closed fracture with routine healing: Secondary | ICD-10-CM | POA: Diagnosis not present

## 2016-03-01 DIAGNOSIS — M810 Age-related osteoporosis without current pathological fracture: Secondary | ICD-10-CM | POA: Diagnosis not present

## 2016-03-01 DIAGNOSIS — E785 Hyperlipidemia, unspecified: Secondary | ICD-10-CM | POA: Diagnosis not present

## 2016-03-01 DIAGNOSIS — Z9181 History of falling: Secondary | ICD-10-CM | POA: Diagnosis not present

## 2016-03-01 DIAGNOSIS — Z923 Personal history of irradiation: Secondary | ICD-10-CM

## 2016-03-01 DIAGNOSIS — R339 Retention of urine, unspecified: Secondary | ICD-10-CM | POA: Diagnosis not present

## 2016-03-01 HISTORY — DX: Personal history of irradiation: Z92.3

## 2016-03-02 DIAGNOSIS — D509 Iron deficiency anemia, unspecified: Secondary | ICD-10-CM | POA: Diagnosis not present

## 2016-03-02 DIAGNOSIS — J449 Chronic obstructive pulmonary disease, unspecified: Secondary | ICD-10-CM | POA: Diagnosis not present

## 2016-03-02 DIAGNOSIS — R339 Retention of urine, unspecified: Secondary | ICD-10-CM | POA: Diagnosis not present

## 2016-03-02 DIAGNOSIS — I2510 Atherosclerotic heart disease of native coronary artery without angina pectoris: Secondary | ICD-10-CM | POA: Diagnosis not present

## 2016-03-02 DIAGNOSIS — J45909 Unspecified asthma, uncomplicated: Secondary | ICD-10-CM | POA: Diagnosis not present

## 2016-03-02 DIAGNOSIS — K219 Gastro-esophageal reflux disease without esophagitis: Secondary | ICD-10-CM | POA: Diagnosis not present

## 2016-03-02 DIAGNOSIS — R269 Unspecified abnormalities of gait and mobility: Secondary | ICD-10-CM | POA: Diagnosis not present

## 2016-03-02 DIAGNOSIS — I509 Heart failure, unspecified: Secondary | ICD-10-CM | POA: Diagnosis not present

## 2016-03-02 DIAGNOSIS — E785 Hyperlipidemia, unspecified: Secondary | ICD-10-CM | POA: Diagnosis not present

## 2016-03-02 DIAGNOSIS — Z9181 History of falling: Secondary | ICD-10-CM | POA: Diagnosis not present

## 2016-03-02 DIAGNOSIS — M810 Age-related osteoporosis without current pathological fracture: Secondary | ICD-10-CM | POA: Diagnosis not present

## 2016-03-02 DIAGNOSIS — S7291XD Unspecified fracture of right femur, subsequent encounter for closed fracture with routine healing: Secondary | ICD-10-CM | POA: Diagnosis not present

## 2016-03-02 DIAGNOSIS — I480 Paroxysmal atrial fibrillation: Secondary | ICD-10-CM | POA: Diagnosis not present

## 2016-03-02 DIAGNOSIS — E039 Hypothyroidism, unspecified: Secondary | ICD-10-CM | POA: Diagnosis not present

## 2016-03-05 DIAGNOSIS — R339 Retention of urine, unspecified: Secondary | ICD-10-CM | POA: Diagnosis not present

## 2016-03-05 DIAGNOSIS — M810 Age-related osteoporosis without current pathological fracture: Secondary | ICD-10-CM | POA: Diagnosis not present

## 2016-03-05 DIAGNOSIS — D509 Iron deficiency anemia, unspecified: Secondary | ICD-10-CM | POA: Diagnosis not present

## 2016-03-05 DIAGNOSIS — E785 Hyperlipidemia, unspecified: Secondary | ICD-10-CM | POA: Diagnosis not present

## 2016-03-05 DIAGNOSIS — J449 Chronic obstructive pulmonary disease, unspecified: Secondary | ICD-10-CM | POA: Diagnosis not present

## 2016-03-05 DIAGNOSIS — I480 Paroxysmal atrial fibrillation: Secondary | ICD-10-CM | POA: Diagnosis not present

## 2016-03-05 DIAGNOSIS — I2510 Atherosclerotic heart disease of native coronary artery without angina pectoris: Secondary | ICD-10-CM | POA: Diagnosis not present

## 2016-03-05 DIAGNOSIS — S7291XD Unspecified fracture of right femur, subsequent encounter for closed fracture with routine healing: Secondary | ICD-10-CM | POA: Diagnosis not present

## 2016-03-05 DIAGNOSIS — Z9181 History of falling: Secondary | ICD-10-CM | POA: Diagnosis not present

## 2016-03-05 DIAGNOSIS — I509 Heart failure, unspecified: Secondary | ICD-10-CM | POA: Diagnosis not present

## 2016-03-05 DIAGNOSIS — K219 Gastro-esophageal reflux disease without esophagitis: Secondary | ICD-10-CM | POA: Diagnosis not present

## 2016-03-05 DIAGNOSIS — E039 Hypothyroidism, unspecified: Secondary | ICD-10-CM | POA: Diagnosis not present

## 2016-03-06 DIAGNOSIS — I509 Heart failure, unspecified: Secondary | ICD-10-CM | POA: Diagnosis not present

## 2016-03-06 DIAGNOSIS — S7292XD Unspecified fracture of left femur, subsequent encounter for closed fracture with routine healing: Secondary | ICD-10-CM | POA: Diagnosis not present

## 2016-03-06 DIAGNOSIS — J449 Chronic obstructive pulmonary disease, unspecified: Secondary | ICD-10-CM | POA: Diagnosis not present

## 2016-03-06 DIAGNOSIS — I480 Paroxysmal atrial fibrillation: Secondary | ICD-10-CM | POA: Diagnosis not present

## 2016-03-10 DIAGNOSIS — I480 Paroxysmal atrial fibrillation: Secondary | ICD-10-CM | POA: Diagnosis not present

## 2016-03-10 DIAGNOSIS — E039 Hypothyroidism, unspecified: Secondary | ICD-10-CM | POA: Diagnosis not present

## 2016-03-10 DIAGNOSIS — S7292XD Unspecified fracture of left femur, subsequent encounter for closed fracture with routine healing: Secondary | ICD-10-CM | POA: Diagnosis not present

## 2016-03-10 DIAGNOSIS — D509 Iron deficiency anemia, unspecified: Secondary | ICD-10-CM | POA: Diagnosis not present

## 2016-03-10 DIAGNOSIS — I509 Heart failure, unspecified: Secondary | ICD-10-CM | POA: Diagnosis not present

## 2016-03-10 DIAGNOSIS — E785 Hyperlipidemia, unspecified: Secondary | ICD-10-CM | POA: Diagnosis not present

## 2016-03-10 DIAGNOSIS — K219 Gastro-esophageal reflux disease without esophagitis: Secondary | ICD-10-CM | POA: Diagnosis not present

## 2016-03-10 DIAGNOSIS — J449 Chronic obstructive pulmonary disease, unspecified: Secondary | ICD-10-CM | POA: Diagnosis not present

## 2016-03-10 DIAGNOSIS — M810 Age-related osteoporosis without current pathological fracture: Secondary | ICD-10-CM | POA: Diagnosis not present

## 2016-03-10 DIAGNOSIS — I2510 Atherosclerotic heart disease of native coronary artery without angina pectoris: Secondary | ICD-10-CM | POA: Diagnosis not present

## 2016-03-10 DIAGNOSIS — Z5181 Encounter for therapeutic drug level monitoring: Secondary | ICD-10-CM | POA: Diagnosis not present

## 2016-03-10 DIAGNOSIS — R339 Retention of urine, unspecified: Secondary | ICD-10-CM | POA: Diagnosis not present

## 2016-03-11 ENCOUNTER — Encounter: Payer: Self-pay | Admitting: Internal Medicine

## 2016-03-11 ENCOUNTER — Ambulatory Visit (INDEPENDENT_AMBULATORY_CARE_PROVIDER_SITE_OTHER): Payer: Medicare Other | Admitting: Orthopaedic Surgery

## 2016-03-11 ENCOUNTER — Ambulatory Visit (INDEPENDENT_AMBULATORY_CARE_PROVIDER_SITE_OTHER): Payer: Medicare Other | Admitting: Internal Medicine

## 2016-03-11 ENCOUNTER — Encounter (INDEPENDENT_AMBULATORY_CARE_PROVIDER_SITE_OTHER): Payer: Self-pay

## 2016-03-11 VITALS — BP 132/90 | HR 94 | Ht 67.0 in | Wt 212.6 lb

## 2016-03-11 DIAGNOSIS — R058 Other specified cough: Secondary | ICD-10-CM

## 2016-03-11 DIAGNOSIS — J449 Chronic obstructive pulmonary disease, unspecified: Secondary | ICD-10-CM

## 2016-03-11 DIAGNOSIS — J441 Chronic obstructive pulmonary disease with (acute) exacerbation: Secondary | ICD-10-CM | POA: Insufficient documentation

## 2016-03-11 DIAGNOSIS — R05 Cough: Secondary | ICD-10-CM

## 2016-03-11 MED ORDER — BUDESONIDE-FORMOTEROL FUMARATE 160-4.5 MCG/ACT IN AERO: 2.0000 | INHALATION_SPRAY | Freq: Two times a day (BID) | RESPIRATORY_TRACT | 0 refills | Status: DC

## 2016-03-11 MED ORDER — BUDESONIDE-FORMOTEROL FUMARATE 80-4.5 MCG/ACT IN AERO: 2.0000 | INHALATION_SPRAY | Freq: Two times a day (BID) | RESPIRATORY_TRACT | 0 refills | Status: DC

## 2016-03-11 NOTE — Progress Notes (Signed)
Subjective:    Patient ID: Danielle [name], female    DOB: [birth year],     MRN: [id number]  HPI  22 yobm quit smoking 2012  At wt 220 With onset sob got  some better p quit then h/o PTX Oct 2015 s/p CT and then again on L Oct 2017 :  Date of Admission: 12/10/2015   Date of Discharge: 12/15/2015 Attending Physician: Michel Bickers, MD  Discharge Diagnosis: Principal Problem:   Hip fracture [facility]) Active Problems:   Fall at home   Hyperlipidemia   Asthma   Atrial fibrillation ([place])   Chronic systolic CHF (congestive heart failure) ([place])   3-vessel CAD   Hyperthyroidism   Lung blebs ([place])   Aortic atherosclerosis ([place])   Acute urinary retention   Osteoporosis   Thrombocytopenia ([facility])   Anemia   Fever   Vitamin D deficiency   03/11/2016 1st [facility] office visit/ Gatlyn Lipari  Consult requested by Dr Emiliano Dyer  Chief Complaint  Patient presents with  . Pulmonary Consult    Pt states she is here for COPD consult. Pt denies SOB, wheezing, or cough.    onset of sob p quit smoking, then worse gradually since  Baseline is walk at [facility] not wm = MMRC3 = can't walk 100 yards even at a slow pace at a flat grade s stopping due to sob   On symbort and no rescue and did not use symb day of ov / sleeps fine at hs  Sleeping on 2 pillows for years    No obvious day to day or daytime variability or assoc chronic excess/ purulent sputum or mucus plugs  or cp or chest tightness, subjective wheeze or overt sinus or hb symptoms. No unusual exp hx or h/o childhood pna/ asthma or knowledge of premature birth.  Sleeping ok without nocturnal  or early am exacerbation  of respiratory  c/o's or need for noct saba. Also denies any obvious fluctuation of symptoms with weather or environmental changes or other aggravating or alleviating factors except as outlined above   Current Medications, Allergies, Complete Past Medical History, Past Surgical History, Family History, and Social  History were reviewed in [employer] record.      Review of Systems  Constitutional: Negative.  Negative for fever and unexpected weight change.  HENT: Negative.  Negative for congestion, dental problem, ear pain, nosebleeds, postnasal drip, rhinorrhea, sinus pressure, sneezing, sore throat and trouble swallowing.   Eyes: Negative.  Negative for redness and itching.  Respiratory: Negative.  Negative for cough, chest tightness, shortness of breath and wheezing.   Cardiovascular: Negative.  Negative for palpitations and leg swelling.  Gastrointestinal: Negative.  Negative for nausea and vomiting.  Genitourinary: Negative.  Negative for dysuria.  Musculoskeletal: Negative.  Negative for joint swelling.  Skin: Negative.  Negative for rash.  Neurological: Negative.  Negative for headaches.  Hematological: Bruises/bleeds easily.  Psychiatric/Behavioral: Negative.  Negative for dysphoric mood. The patient is not nervous/anxious.        Objective:   Physical Exam   W/c bound wf  (L hip fx oct 2017)   Wt Readings from Last 3 Encounters:  03/11/16 212 lb 9.6 oz (96.4 kg)  12/25/15 219 lb (99.3 kg)  12/23/15 209 lb (94.8 kg)    Vital signs reviewed - Note on arrival 02 sats  99% on RA      HEENT: nl  turbinates, and oropharynx. Nl external ear canals without cough reflex- top dentures  NECK :  without JVD/Nodes/TM/ nl carotid upstrokes bilaterally/ classic pseudowheeze better with plm    LUNGS: no acc muscle use,  Nl contour chest which is clear to A and P bilaterally without cough on insp or exp maneuvers   CV:  RRR  no s3 or murmur or increase in P2, nad no edema   ABD:  soft and nontender with nl inspiratory excursion in the supine position. No bruits or organomegaly appreciated, bowel sounds nl  MS:  Nl gait/ ext warm without deformities, calf tenderness, cyanosis or clubbing No obvious joint restrictions   SKIN: warm and dry without lesions     NEURO:  alert, approp, nl sensorium with  no motor or cerebellar deficits apparent.     I personally reviewed images and agree with radiology impression as follows:  CXR:   12/23/15  1. Postsurgical changes left upper lobe. No air-fluid level in the left upper lobe on today's exam.  2.  Biapical left base pleural parenchymal scarring.      Assessment & Plan:

## 2016-03-11 NOTE — Patient Instructions (Addendum)
Plan A = Automatic = Symbicort  160 Take 2 puffs first thing in am and then another 2 puffs about 12 hours later - if you are doing well ok to leave off well    Work on inhaler technique:  relax and gently blow all the way out then take a nice smooth deep breath back in, triggering the inhaler at same time you start breathing in.  Hold for up to 5 seconds if you can. Blow out thru nose. Rinse and gargle with water when done     Plan B = Backup Only use your albuterol (proair)  as a rescue medication to be used if you can't catch your breath by resting or doing a relaxed purse lip breathing pattern.  - The less you use it, the better it will work when you need it. - Ok to use the inhaler up to 2 puffs  every 4 hours if you must but call for appointment if use goes up over your usual need - Don't leave home without it !!  (think of it like the spare tire for your car)   I will let Dr Baird Cancer know re fosfamax subsitute   In event of cough > zantac 150 mg after breakfast and at supper   GERD (REFLUX)  is an extremely common cause of respiratory symptoms just like yours , many times with no obvious heartburn at all.    It can be treated with medication, but also with lifestyle changes including elevation of the head of your bed (ideally with 6 inch  bed blocks),  Smoking cessation, avoidance of late meals, excessive alcohol, and avoid fatty foods, chocolate, peppermint, colas, red wine, and acidic juices such as orange juice.  NO MINT OR MENTHOL PRODUCTS SO NO COUGH DROPS  USE SUGARLESS CANDY INSTEAD (Jolley ranchers or Stover's or Life Savers) or even ice chips will also do - the key is to swallow to prevent all throat clearing. NO OIL BASED VITAMINS - use powdered substitutes.     If you are satisfied with your treatment plan,  let your doctor know and he/she can either refill your medications or you can return here when your prescription runs out.     If in any way you are not 100% satisfied,   please tell us.  If 100% better, tell your friends!  Pulmonary follow up is as needed

## 2016-03-14 DIAGNOSIS — R058 Other specified cough: Secondary | ICD-10-CM | POA: Insufficient documentation

## 2016-03-14 DIAGNOSIS — R05 Cough: Secondary | ICD-10-CM | POA: Insufficient documentation

## 2016-03-14 NOTE — Assessment & Plan Note (Signed)
Body mass index is 33.3 kg/m.  No results found for: TSH   Contributing to gerd tendency/ doe/reviewed the need and the process to achieve and maintain neg calorie balance > defer f/u primary care including intermittently monitoring thyroid status

## 2016-03-14 NOTE — Assessment & Plan Note (Signed)
Quit smoking 2012 - Spirometry 03/11/2016  FEV1 1.88 (89%)  Ratio 73  - very min curvature  - The proper method of use, as well as anticipated side effects, of a metered-dose inhaler are discussed and demonstrated to the patient. Improved effectiveness after extensive coaching during this visit to a level of approximately 75 % from a baseline of 50 % > ok to continue hfa delivery    So she really has very little copd at all though may be at risk of AB/ recurrent PTX - most of her symptoms are upper airway (see separate a/p) - so if doing ok it's fine with me to leave off symbicort since if starts to worsen can add it back and usually see results p 5 min (the only approved med for copd that has this rapid /convincing impact at onset)     I reviewed the Fletcher curve with the patient that basically indicates  if you quit smoking when your best day FEV1 is still well preserved (as is clearly  the case here)  it is highly unlikely you will progress to severe disease and informed the patient there was  no medication on the market that has proven to alter the curve/ its downward trajectory  or the likelihood of progression of their disease(unlike other chronic medical conditions such as atheroclerosis where we do think we can change the natural hx with risk reducing meds)    Therefore stopping smoking and maintaining abstinence is the most important aspect of care, not choice of inhalers or for that matter, doctors.    Pulmonary f/u can be prn  Total time devoted to counseling  > 50 % of 60 min initial office visit:  review case with pt/ discussion of options/alternatives/ personally creating written customized instructions  in presence of pt  then going over those specific  Instructions directly with the pt including how to use all of the meds but in particular covering each new medication in detail and the difference between the maintenance/automatic meds and the prns using an action plan format for the  latter.  Please see AVS from this visit for a full list of these instructions which I personally wrote for this pt and  are unique to this visit.

## 2016-03-14 NOTE — Assessment & Plan Note (Signed)
Exam is most c/w Upper airway cough syndrome (previously labeled PNDS) , is  so named because it's frequently impossible to sort out how much is  CR/sinusitis with freq throat clearing (which can be related to primary GERD)   vs  causing  secondary (" extra esophageal")  GERD from wide swings in gastric pressure that occur with throat clearing, often  promoting self use of mint and menthol lozenges that reduce the lower esophageal sphincter tone and exacerbate the problem further in a cyclical fashion.   These are the same pts (now being labeled as having "irritable larynx syndrome" by some cough centers) who not infrequently have a history of having failed to tolerate ace inhibitors,  dry powder inhalers or biphosphonates or report having atypical/extraesophageal reflux symptoms that don't respond to standard doses of PPI  and are easily confused as having aecopd or asthma flares by even experienced allergists/ pulmonologists (myself included).   If her non-specific upper airway symptoms worsen at all would strongly rec yearly IV reclast instead of po fosfamax and more aggressive rx for gerd but for now bid zantac is probably ok

## 2016-03-16 DIAGNOSIS — E039 Hypothyroidism, unspecified: Secondary | ICD-10-CM | POA: Diagnosis not present

## 2016-03-16 DIAGNOSIS — S7292XD Unspecified fracture of left femur, subsequent encounter for closed fracture with routine healing: Secondary | ICD-10-CM | POA: Diagnosis not present

## 2016-03-16 DIAGNOSIS — I480 Paroxysmal atrial fibrillation: Secondary | ICD-10-CM | POA: Diagnosis not present

## 2016-03-16 DIAGNOSIS — R339 Retention of urine, unspecified: Secondary | ICD-10-CM | POA: Diagnosis not present

## 2016-03-16 DIAGNOSIS — J449 Chronic obstructive pulmonary disease, unspecified: Secondary | ICD-10-CM | POA: Diagnosis not present

## 2016-03-16 DIAGNOSIS — I509 Heart failure, unspecified: Secondary | ICD-10-CM | POA: Diagnosis not present

## 2016-03-16 DIAGNOSIS — K219 Gastro-esophageal reflux disease without esophagitis: Secondary | ICD-10-CM | POA: Diagnosis not present

## 2016-03-16 DIAGNOSIS — Z5181 Encounter for therapeutic drug level monitoring: Secondary | ICD-10-CM | POA: Diagnosis not present

## 2016-03-16 DIAGNOSIS — D509 Iron deficiency anemia, unspecified: Secondary | ICD-10-CM | POA: Diagnosis not present

## 2016-03-16 DIAGNOSIS — E785 Hyperlipidemia, unspecified: Secondary | ICD-10-CM | POA: Diagnosis not present

## 2016-03-16 DIAGNOSIS — I2510 Atherosclerotic heart disease of native coronary artery without angina pectoris: Secondary | ICD-10-CM | POA: Diagnosis not present

## 2016-03-16 DIAGNOSIS — M810 Age-related osteoporosis without current pathological fracture: Secondary | ICD-10-CM | POA: Diagnosis not present

## 2016-03-18 ENCOUNTER — Ambulatory Visit: Payer: Medicare Other | Admitting: Internal Medicine

## 2016-03-19 DIAGNOSIS — E039 Hypothyroidism, unspecified: Secondary | ICD-10-CM | POA: Diagnosis not present

## 2016-03-19 DIAGNOSIS — I480 Paroxysmal atrial fibrillation: Secondary | ICD-10-CM | POA: Diagnosis not present

## 2016-03-19 DIAGNOSIS — Z5181 Encounter for therapeutic drug level monitoring: Secondary | ICD-10-CM | POA: Diagnosis not present

## 2016-03-19 DIAGNOSIS — R339 Retention of urine, unspecified: Secondary | ICD-10-CM | POA: Diagnosis not present

## 2016-03-19 DIAGNOSIS — I2510 Atherosclerotic heart disease of native coronary artery without angina pectoris: Secondary | ICD-10-CM | POA: Diagnosis not present

## 2016-03-19 DIAGNOSIS — S7292XD Unspecified fracture of left femur, subsequent encounter for closed fracture with routine healing: Secondary | ICD-10-CM | POA: Diagnosis not present

## 2016-03-19 DIAGNOSIS — M810 Age-related osteoporosis without current pathological fracture: Secondary | ICD-10-CM | POA: Diagnosis not present

## 2016-03-19 DIAGNOSIS — E785 Hyperlipidemia, unspecified: Secondary | ICD-10-CM | POA: Diagnosis not present

## 2016-03-19 DIAGNOSIS — K219 Gastro-esophageal reflux disease without esophagitis: Secondary | ICD-10-CM | POA: Diagnosis not present

## 2016-03-19 DIAGNOSIS — I509 Heart failure, unspecified: Secondary | ICD-10-CM | POA: Diagnosis not present

## 2016-03-19 DIAGNOSIS — D509 Iron deficiency anemia, unspecified: Secondary | ICD-10-CM | POA: Diagnosis not present

## 2016-03-19 DIAGNOSIS — J449 Chronic obstructive pulmonary disease, unspecified: Secondary | ICD-10-CM | POA: Diagnosis not present

## 2016-03-20 DIAGNOSIS — R339 Retention of urine, unspecified: Secondary | ICD-10-CM | POA: Diagnosis not present

## 2016-03-20 DIAGNOSIS — M810 Age-related osteoporosis without current pathological fracture: Secondary | ICD-10-CM | POA: Diagnosis not present

## 2016-03-20 DIAGNOSIS — I480 Paroxysmal atrial fibrillation: Secondary | ICD-10-CM | POA: Diagnosis not present

## 2016-03-20 DIAGNOSIS — J449 Chronic obstructive pulmonary disease, unspecified: Secondary | ICD-10-CM | POA: Diagnosis not present

## 2016-03-20 DIAGNOSIS — D509 Iron deficiency anemia, unspecified: Secondary | ICD-10-CM | POA: Diagnosis not present

## 2016-03-20 DIAGNOSIS — I2510 Atherosclerotic heart disease of native coronary artery without angina pectoris: Secondary | ICD-10-CM | POA: Diagnosis not present

## 2016-03-20 DIAGNOSIS — I509 Heart failure, unspecified: Secondary | ICD-10-CM | POA: Diagnosis not present

## 2016-03-20 DIAGNOSIS — E039 Hypothyroidism, unspecified: Secondary | ICD-10-CM | POA: Diagnosis not present

## 2016-03-20 DIAGNOSIS — E785 Hyperlipidemia, unspecified: Secondary | ICD-10-CM | POA: Diagnosis not present

## 2016-03-20 DIAGNOSIS — K219 Gastro-esophageal reflux disease without esophagitis: Secondary | ICD-10-CM | POA: Diagnosis not present

## 2016-03-20 DIAGNOSIS — Z5181 Encounter for therapeutic drug level monitoring: Secondary | ICD-10-CM | POA: Diagnosis not present

## 2016-03-20 DIAGNOSIS — S7292XD Unspecified fracture of left femur, subsequent encounter for closed fracture with routine healing: Secondary | ICD-10-CM | POA: Diagnosis not present

## 2016-03-22 DIAGNOSIS — I480 Paroxysmal atrial fibrillation: Secondary | ICD-10-CM | POA: Diagnosis not present

## 2016-03-22 DIAGNOSIS — R339 Retention of urine, unspecified: Secondary | ICD-10-CM | POA: Diagnosis not present

## 2016-03-22 DIAGNOSIS — S7292XD Unspecified fracture of left femur, subsequent encounter for closed fracture with routine healing: Secondary | ICD-10-CM | POA: Diagnosis not present

## 2016-03-22 DIAGNOSIS — K219 Gastro-esophageal reflux disease without esophagitis: Secondary | ICD-10-CM | POA: Diagnosis not present

## 2016-03-22 DIAGNOSIS — E039 Hypothyroidism, unspecified: Secondary | ICD-10-CM | POA: Diagnosis not present

## 2016-03-22 DIAGNOSIS — J449 Chronic obstructive pulmonary disease, unspecified: Secondary | ICD-10-CM | POA: Diagnosis not present

## 2016-03-22 DIAGNOSIS — D509 Iron deficiency anemia, unspecified: Secondary | ICD-10-CM | POA: Diagnosis not present

## 2016-03-22 DIAGNOSIS — I509 Heart failure, unspecified: Secondary | ICD-10-CM | POA: Diagnosis not present

## 2016-03-22 DIAGNOSIS — E785 Hyperlipidemia, unspecified: Secondary | ICD-10-CM | POA: Diagnosis not present

## 2016-03-22 DIAGNOSIS — M810 Age-related osteoporosis without current pathological fracture: Secondary | ICD-10-CM | POA: Diagnosis not present

## 2016-03-22 DIAGNOSIS — Z5181 Encounter for therapeutic drug level monitoring: Secondary | ICD-10-CM | POA: Diagnosis not present

## 2016-03-22 DIAGNOSIS — I2510 Atherosclerotic heart disease of native coronary artery without angina pectoris: Secondary | ICD-10-CM | POA: Diagnosis not present

## 2016-03-24 DIAGNOSIS — R339 Retention of urine, unspecified: Secondary | ICD-10-CM | POA: Diagnosis not present

## 2016-03-24 DIAGNOSIS — Z5181 Encounter for therapeutic drug level monitoring: Secondary | ICD-10-CM | POA: Diagnosis not present

## 2016-03-24 DIAGNOSIS — I509 Heart failure, unspecified: Secondary | ICD-10-CM | POA: Diagnosis not present

## 2016-03-24 DIAGNOSIS — I2510 Atherosclerotic heart disease of native coronary artery without angina pectoris: Secondary | ICD-10-CM | POA: Diagnosis not present

## 2016-03-24 DIAGNOSIS — M810 Age-related osteoporosis without current pathological fracture: Secondary | ICD-10-CM | POA: Diagnosis not present

## 2016-03-24 DIAGNOSIS — K219 Gastro-esophageal reflux disease without esophagitis: Secondary | ICD-10-CM | POA: Diagnosis not present

## 2016-03-24 DIAGNOSIS — E039 Hypothyroidism, unspecified: Secondary | ICD-10-CM | POA: Diagnosis not present

## 2016-03-24 DIAGNOSIS — J449 Chronic obstructive pulmonary disease, unspecified: Secondary | ICD-10-CM | POA: Diagnosis not present

## 2016-03-24 DIAGNOSIS — E785 Hyperlipidemia, unspecified: Secondary | ICD-10-CM | POA: Diagnosis not present

## 2016-03-24 DIAGNOSIS — S7292XD Unspecified fracture of left femur, subsequent encounter for closed fracture with routine healing: Secondary | ICD-10-CM | POA: Diagnosis not present

## 2016-03-24 DIAGNOSIS — I480 Paroxysmal atrial fibrillation: Secondary | ICD-10-CM | POA: Diagnosis not present

## 2016-03-24 DIAGNOSIS — D509 Iron deficiency anemia, unspecified: Secondary | ICD-10-CM | POA: Diagnosis not present

## 2016-03-26 DIAGNOSIS — E785 Hyperlipidemia, unspecified: Secondary | ICD-10-CM | POA: Diagnosis not present

## 2016-03-26 DIAGNOSIS — I480 Paroxysmal atrial fibrillation: Secondary | ICD-10-CM | POA: Diagnosis not present

## 2016-03-26 DIAGNOSIS — E039 Hypothyroidism, unspecified: Secondary | ICD-10-CM | POA: Diagnosis not present

## 2016-03-26 DIAGNOSIS — K219 Gastro-esophageal reflux disease without esophagitis: Secondary | ICD-10-CM | POA: Diagnosis not present

## 2016-03-26 DIAGNOSIS — S7292XD Unspecified fracture of left femur, subsequent encounter for closed fracture with routine healing: Secondary | ICD-10-CM | POA: Diagnosis not present

## 2016-03-26 DIAGNOSIS — J449 Chronic obstructive pulmonary disease, unspecified: Secondary | ICD-10-CM | POA: Diagnosis not present

## 2016-03-26 DIAGNOSIS — I509 Heart failure, unspecified: Secondary | ICD-10-CM | POA: Diagnosis not present

## 2016-03-26 DIAGNOSIS — I2510 Atherosclerotic heart disease of native coronary artery without angina pectoris: Secondary | ICD-10-CM | POA: Diagnosis not present

## 2016-03-26 DIAGNOSIS — M810 Age-related osteoporosis without current pathological fracture: Secondary | ICD-10-CM | POA: Diagnosis not present

## 2016-03-26 DIAGNOSIS — R339 Retention of urine, unspecified: Secondary | ICD-10-CM | POA: Diagnosis not present

## 2016-03-26 DIAGNOSIS — Z5181 Encounter for therapeutic drug level monitoring: Secondary | ICD-10-CM | POA: Diagnosis not present

## 2016-03-26 DIAGNOSIS — D509 Iron deficiency anemia, unspecified: Secondary | ICD-10-CM | POA: Diagnosis not present

## 2016-03-31 DIAGNOSIS — I2510 Atherosclerotic heart disease of native coronary artery without angina pectoris: Secondary | ICD-10-CM | POA: Diagnosis not present

## 2016-03-31 DIAGNOSIS — S7292XD Unspecified fracture of left femur, subsequent encounter for closed fracture with routine healing: Secondary | ICD-10-CM | POA: Diagnosis not present

## 2016-03-31 DIAGNOSIS — M810 Age-related osteoporosis without current pathological fracture: Secondary | ICD-10-CM | POA: Diagnosis not present

## 2016-03-31 DIAGNOSIS — R339 Retention of urine, unspecified: Secondary | ICD-10-CM | POA: Diagnosis not present

## 2016-03-31 DIAGNOSIS — J449 Chronic obstructive pulmonary disease, unspecified: Secondary | ICD-10-CM | POA: Diagnosis not present

## 2016-03-31 DIAGNOSIS — E785 Hyperlipidemia, unspecified: Secondary | ICD-10-CM | POA: Diagnosis not present

## 2016-03-31 DIAGNOSIS — Z5181 Encounter for therapeutic drug level monitoring: Secondary | ICD-10-CM | POA: Diagnosis not present

## 2016-03-31 DIAGNOSIS — I509 Heart failure, unspecified: Secondary | ICD-10-CM | POA: Diagnosis not present

## 2016-03-31 DIAGNOSIS — I480 Paroxysmal atrial fibrillation: Secondary | ICD-10-CM | POA: Diagnosis not present

## 2016-03-31 DIAGNOSIS — E039 Hypothyroidism, unspecified: Secondary | ICD-10-CM | POA: Diagnosis not present

## 2016-03-31 DIAGNOSIS — D509 Iron deficiency anemia, unspecified: Secondary | ICD-10-CM | POA: Diagnosis not present

## 2016-03-31 DIAGNOSIS — K219 Gastro-esophageal reflux disease without esophagitis: Secondary | ICD-10-CM | POA: Diagnosis not present

## 2016-04-01 DIAGNOSIS — S7292XD Unspecified fracture of left femur, subsequent encounter for closed fracture with routine healing: Secondary | ICD-10-CM | POA: Diagnosis not present

## 2016-04-01 DIAGNOSIS — E039 Hypothyroidism, unspecified: Secondary | ICD-10-CM | POA: Diagnosis not present

## 2016-04-01 DIAGNOSIS — I2510 Atherosclerotic heart disease of native coronary artery without angina pectoris: Secondary | ICD-10-CM | POA: Diagnosis not present

## 2016-04-01 DIAGNOSIS — Z5181 Encounter for therapeutic drug level monitoring: Secondary | ICD-10-CM | POA: Diagnosis not present

## 2016-04-01 DIAGNOSIS — K219 Gastro-esophageal reflux disease without esophagitis: Secondary | ICD-10-CM | POA: Diagnosis not present

## 2016-04-01 DIAGNOSIS — D509 Iron deficiency anemia, unspecified: Secondary | ICD-10-CM | POA: Diagnosis not present

## 2016-04-01 DIAGNOSIS — M810 Age-related osteoporosis without current pathological fracture: Secondary | ICD-10-CM | POA: Diagnosis not present

## 2016-04-01 DIAGNOSIS — J449 Chronic obstructive pulmonary disease, unspecified: Secondary | ICD-10-CM | POA: Diagnosis not present

## 2016-04-01 DIAGNOSIS — I480 Paroxysmal atrial fibrillation: Secondary | ICD-10-CM | POA: Diagnosis not present

## 2016-04-01 DIAGNOSIS — E785 Hyperlipidemia, unspecified: Secondary | ICD-10-CM | POA: Diagnosis not present

## 2016-04-01 DIAGNOSIS — I509 Heart failure, unspecified: Secondary | ICD-10-CM | POA: Diagnosis not present

## 2016-04-01 DIAGNOSIS — R339 Retention of urine, unspecified: Secondary | ICD-10-CM | POA: Diagnosis not present

## 2016-04-02 DIAGNOSIS — I480 Paroxysmal atrial fibrillation: Secondary | ICD-10-CM | POA: Diagnosis not present

## 2016-04-02 DIAGNOSIS — E785 Hyperlipidemia, unspecified: Secondary | ICD-10-CM | POA: Diagnosis not present

## 2016-04-02 DIAGNOSIS — Z5181 Encounter for therapeutic drug level monitoring: Secondary | ICD-10-CM | POA: Diagnosis not present

## 2016-04-02 DIAGNOSIS — D509 Iron deficiency anemia, unspecified: Secondary | ICD-10-CM | POA: Diagnosis not present

## 2016-04-02 DIAGNOSIS — R339 Retention of urine, unspecified: Secondary | ICD-10-CM | POA: Diagnosis not present

## 2016-04-02 DIAGNOSIS — I2510 Atherosclerotic heart disease of native coronary artery without angina pectoris: Secondary | ICD-10-CM | POA: Diagnosis not present

## 2016-04-02 DIAGNOSIS — K219 Gastro-esophageal reflux disease without esophagitis: Secondary | ICD-10-CM | POA: Diagnosis not present

## 2016-04-02 DIAGNOSIS — J45909 Unspecified asthma, uncomplicated: Secondary | ICD-10-CM | POA: Diagnosis not present

## 2016-04-02 DIAGNOSIS — I509 Heart failure, unspecified: Secondary | ICD-10-CM | POA: Diagnosis not present

## 2016-04-02 DIAGNOSIS — J449 Chronic obstructive pulmonary disease, unspecified: Secondary | ICD-10-CM | POA: Diagnosis not present

## 2016-04-02 DIAGNOSIS — M810 Age-related osteoporosis without current pathological fracture: Secondary | ICD-10-CM | POA: Diagnosis not present

## 2016-04-02 DIAGNOSIS — S7292XD Unspecified fracture of left femur, subsequent encounter for closed fracture with routine healing: Secondary | ICD-10-CM | POA: Diagnosis not present

## 2016-04-02 DIAGNOSIS — E039 Hypothyroidism, unspecified: Secondary | ICD-10-CM | POA: Diagnosis not present

## 2016-04-02 DIAGNOSIS — R269 Unspecified abnormalities of gait and mobility: Secondary | ICD-10-CM | POA: Diagnosis not present

## 2016-04-06 ENCOUNTER — Encounter (INDEPENDENT_AMBULATORY_CARE_PROVIDER_SITE_OTHER): Payer: Self-pay | Admitting: Orthopaedic Surgery

## 2016-04-06 ENCOUNTER — Telehealth (INDEPENDENT_AMBULATORY_CARE_PROVIDER_SITE_OTHER): Payer: Self-pay | Admitting: *Deleted

## 2016-04-06 ENCOUNTER — Ambulatory Visit (INDEPENDENT_AMBULATORY_CARE_PROVIDER_SITE_OTHER): Payer: Medicare Other | Admitting: Orthopaedic Surgery

## 2016-04-06 ENCOUNTER — Ambulatory Visit (INDEPENDENT_AMBULATORY_CARE_PROVIDER_SITE_OTHER): Payer: Medicare Other

## 2016-04-06 DIAGNOSIS — S72002D Fracture of unspecified part of neck of left femur, subsequent encounter for closed fracture with routine healing: Secondary | ICD-10-CM | POA: Diagnosis not present

## 2016-04-06 MED ORDER — DICLOFENAC SODIUM 75 MG PO TBEC: 75.0000 mg | DELAYED_RELEASE_TABLET | Freq: Two times a day (BID) | ORAL | 2 refills | Status: DC

## 2016-04-06 NOTE — Telephone Encounter (Signed)
Patient called in this afternoon in regards the anti-inflammatory medication that Dr. Erlinda Hong prescribed her today needs to be changed is possible. She is on Coumadin and this medication reacts badly with this blood thinner so she would like to know if Dr. Erlinda Hong could please prescribe her another anti-inflamatory. Thank you her CB # [phone]) [phone].

## 2016-04-06 NOTE — Telephone Encounter (Signed)
Please advise 

## 2016-04-06 NOTE — Progress Notes (Signed)
Office Visit Note   Patient: Danielle [name]           Date of Birth: March 09, 1945           MRN: [id number] Visit Date: 04/06/2016              Requested by: Glendale Chard, MD [address] [place],  [address] PCP: Maximino Greenland, MD   Assessment & Plan: Visit Diagnoses:  1. Closed fracture of left hip with routine healing, subsequent encounter     Plan: Left hip fracture is stable and healed.  Doing well.  Right hip flexor strain.  Recommend diclofenac, rest, ice, stretching.  F/u prn  Follow-Up Instructions: Return if symptoms worsen or fail to improve.   Orders:  Orders Placed This Encounter  Procedures  . XR HIP UNILAT W OR W/O PELVIS 2-3 VIEWS LEFT   Meds ordered this encounter  Medications  . diclofenac (VOLTAREN) 75 MG EC tablet    Sig: Take 1 tablet (75 mg total) by mouth 2 (two) times daily.    Dispense:  30 tablet    Refill:  2      Procedures: No procedures performed   Clinical Data: No additional findings.   Subjective: Chief Complaint  Patient presents with  . Left Hip - Pain    Patient is 4 months s/p left hip IM nail.  Doing well.  No complaints.  C/o some right hip flexor pain with PT.      Review of Systems   Objective: Vital Signs: There were no vitals taken for this visit.  Physical Exam  Ortho Exam Left hip ROM is painless.  Benign exam Right hip flexion elicits pain. Specialty Comments:  No specialty comments available.  Imaging: Xr Hip Unilat W Or W/o Pelvis 2-3 Views Left  Result Date: 04/06/2016 Healed left intertroch hip fx.  Stable hardware.  No acute findings of right hip    PMFS History: Patient Active Problem List   Diagnosis Date Noted  . Upper airway cough syndrome 03/14/2016  . Morbid obesity due to excess calories ([place]) 03/14/2016  . COPD GOLD 0  03/11/2016  . Thrombocytopenia ([place]) 12/12/2015  . Anemia 12/12/2015  . Fever 12/12/2015  . Vitamin D deficiency 12/12/2015  .  Acute urinary retention 12/11/2015  . Osteoporosis 12/11/2015  . Hip fracture ([place]) 12/10/2015  . Aortic atherosclerosis ([place]) 12/10/2015  . Lung blebs ([place]) 12/03/2015  . Pelvic fracture ([place]) 08/19/2015  . Fall at home 08/19/2015  . Hyperlipidemia 08/19/2015  . Asthma 08/19/2015  . Atrial fibrillation ([place]) 08/19/2015  . Hyperthyroidism 08/19/2015  . Chronic systolic CHF (congestive heart failure) ([place])   . 3-vessel CAD   . Aortic stenosis    Past Medical History:  Diagnosis Date  . Aortic stenosis    Status post bioprosthetic AVR  . Arthritis    DJD  . Asthma   . Chronic systolic CHF (congestive heart failure) ([facility])    EF 30% 04/2014  . Coronary artery disease   . Gastritis   . Hyperlipidemia   . Hypertension   . Hyperthyroidism   . Multiple thyroid nodules   . Pelvis fracture ([place]) 08/19/2015   MULTIPLE   . Spontaneous pneumothorax 11/28/2015   left   . Stroke [facility])     Family History  Problem Relation Age of Onset  . Diabetes Mother   . Heart attack Mother   . Diabetes Father   . Lung cancer Father   . Diabetes  Sister   . Diabetes Sister   . HIV Brother     Past Surgical History:  Procedure Laterality Date  . CHEST TUBE INSERTION  10/2015  . CORONARY ARTERY BYPASS GRAFT  04/2014  . INTRAMEDULLARY (IM) NAIL INTERTROCHANTERIC Left 12/10/2015   Procedure: INTRAMEDULLARY (IM) NAIL INTERTROCHANTRIC;  Surgeon: Leandrew Koyanagi, MD;  Location: [place];  Service: Orthopedics;  Laterality: Left;  . PLEURADESIS Left 12/03/2015   Procedure: PLEURADESIS;  Surgeon: Melrose Nakayama, MD;  Location: [place];  Service: Thoracic;  Laterality: Left;  . RESECTION OF APICAL BLEB Left 12/03/2015   Procedure: BLEBECTOMY;  Surgeon: Melrose Nakayama, MD;  Location: [place];  Service: Thoracic;  Laterality: Left;  . THORACOSCOPY  12/03/2015  . VALVE REPLACEMENT    . VIDEO ASSISTED THORACOSCOPY Left 12/03/2015   Procedure: VIDEO ASSISTED THORACOSCOPY;  Surgeon: Melrose Nakayama,  MD;  Location: [place];  Service: Thoracic;  Laterality: Left;   Social History   Occupational History  . Retired in 2004    Social History Main Topics  . Smoking status: Former Smoker    Quit date: 03/01/2010  . Smokeless tobacco: Never Used  . Alcohol use No  . Drug use: No  . Sexual activity: Not on file

## 2016-04-06 NOTE — Telephone Encounter (Signed)
She cannot take any anti-inflammatories because of her Coumadin. She will have to take Tylenol.

## 2016-04-07 NOTE — Telephone Encounter (Signed)
Called pt to let her know. Pt aware only can take Tylenol

## 2016-04-08 ENCOUNTER — Encounter: Payer: Self-pay | Admitting: Internal Medicine

## 2016-04-08 ENCOUNTER — Ambulatory Visit (INDEPENDENT_AMBULATORY_CARE_PROVIDER_SITE_OTHER): Payer: Medicare Other | Admitting: Internal Medicine

## 2016-04-08 VITALS — BP 130/86 | HR 92 | Ht 67.0 in | Wt 205.4 lb

## 2016-04-08 DIAGNOSIS — Z952 Presence of prosthetic heart valve: Secondary | ICD-10-CM | POA: Diagnosis not present

## 2016-04-08 DIAGNOSIS — I480 Paroxysmal atrial fibrillation: Secondary | ICD-10-CM

## 2016-04-08 DIAGNOSIS — I359 Nonrheumatic aortic valve disorder, unspecified: Secondary | ICD-10-CM | POA: Diagnosis not present

## 2016-04-08 DIAGNOSIS — I428 Other cardiomyopathies: Secondary | ICD-10-CM

## 2016-04-08 DIAGNOSIS — R0602 Shortness of breath: Secondary | ICD-10-CM

## 2016-04-08 DIAGNOSIS — I5022 Chronic systolic (congestive) heart failure: Secondary | ICD-10-CM

## 2016-04-08 DIAGNOSIS — I10 Essential (primary) hypertension: Secondary | ICD-10-CM

## 2016-04-08 MED ORDER — LOSARTAN POTASSIUM 25 MG PO TABS: 25.0000 mg | ORAL_TABLET | Freq: Every day | ORAL | 1 refills | Status: DC

## 2016-04-08 MED ORDER — CARVEDILOL 12.5 MG PO TABS: 12.5000 mg | ORAL_TABLET | Freq: Two times a day (BID) | ORAL | 1 refills | Status: DC

## 2016-04-08 NOTE — Progress Notes (Signed)
New Outpatient Visit Date: 04/08/2016  Referring Provider: Glendale Chard, MD [address] [place], [place] [address]  Chief Complaint: Establish cardiology care  HPI:  Danielle [name] is a 70 y.o. year-old female with history of aortic valve disease (severe regurgitation by her description) s/p bioprosthetic aortic valve replacement in [place] in 03/2014, NICM with LVEF as low as 30-35% by report in 03/2015, paroxysmal atrial fibrillation on chronic warfarin, stroke x 2, hyperlipidemia, hypertension, hyperthyroidism, spontaneous pneumothorax, and left hip fracture, who has been referred by Dr. Baird Cancer to establish cardiovascular care in the area. The patient moved to [place] last year and was initially followed at [facility]. She has since moved to [place] and was seen once at [facility]. However, she would like to establish ongoing care with [facility]. Danielle [name]'s only complaint today is of chronic cough and exertional shortness of breath. She attributes some of this to deconditioning secondary to her spontaneous pneumothorax in 11/2015 and subsequent fall with left hip fracture. However, she notes chronic dyspnea on exertion dating back to even before her AVR in 2016. She endorses stable 3 pillow orthopnea without PND or leg edema. Her weight has been stable over the last several months. She denies chest pain. The patient reports occasional lightheadedness when taking carvedilol 25 mg BID; she is currently on 6.25 mg BID without any dizziness. She denies palpitations, though she has never experienced palpitations even when in atrial fibrillation.  The patient is participating in physical therapy following her left hip fracture. She has not had any further falls. She notes that her fall in 11/2015 was mechanical, after getting caught on her walker 1 day after being discharged following pneumothorax. Danielle [name] has been compliant with her medications,  including warfarin. She notes labile INR that has been checked by her PCP. She would like to become established in our anticoagulation clinic. She has not had any falls.  --------------------------------------------------------------------------------------------------  Cardiovascular History & Procedures: Cardiovascular Problems:  Aortic valve disease status post bioprosthetic AVR in 03/2014  Non-ischemic cardiomyopathy  Paroxysmal atrial fibrillation  Stroke  Risk Factors:  Hypertension, hyperlipidemia, stroke, and age > 37  Cath/PCI:  None available (patient reports cath without significant CAD in the past)  CV Surgery:  Bioprosthetic aortic valve replacement (03/12/14, [facility], [place], [place])  EP Procedures and Devices:  None  Non-Invasive Evaluation(s):  TTE (03/27/15, OSH): Mild LVH with LVEF 30-35%, mild left atrial enlargement, AVR in place without regurgitation.  TTE (05/10/14, OSH): LVEF 45-50%  Recent CV Pertinent Labs: Lab Results  Component Value Date   INR 2.19 12/15/2015   K 4.0 12/12/2015   BUN 10 12/12/2015   CREATININE 0.91 12/12/2015    --------------------------------------------------------------------------------------------------  Past Medical History:  Diagnosis Date  . Aortic stenosis    Status post bioprosthetic AVR  . Arthritis    DJD  . Asthma   . Chronic systolic CHF (congestive heart failure) (HCC)    EF 30% 04/2014  . Coronary artery disease   . Gastritis   . Hyperlipidemia   . Hypertension   . Hyperthyroidism   . Multiple thyroid nodules   . Pelvis fracture ([place]) 08/19/2015   MULTIPLE   . Spontaneous pneumothorax 11/28/2015   left   . Stroke [facility])     Past Surgical History:  Procedure Laterality Date  . CHEST TUBE INSERTION  10/2015  . CORONARY ARTERY BYPASS GRAFT  04/2014  . INTRAMEDULLARY (IM) NAIL INTERTROCHANTERIC Left 12/10/2015   Procedure: INTRAMEDULLARY (IM) NAIL INTERTROCHANTRIC;  Surgeon: Leandrew Koyanagi, MD;  Location: [place];  Service: Orthopedics;  Laterality: Left;  . PLEURADESIS Left 12/03/2015   Procedure: PLEURADESIS;  Surgeon: Melrose Nakayama, MD;  Location: [place];  Service: Thoracic;  Laterality: Left;  . RESECTION OF APICAL BLEB Left 12/03/2015   Procedure: BLEBECTOMY;  Surgeon: Melrose Nakayama, MD;  Location: [place];  Service: Thoracic;  Laterality: Left;  . THORACOSCOPY  12/03/2015  . VALVE REPLACEMENT    . VIDEO ASSISTED THORACOSCOPY Left 12/03/2015   Procedure: VIDEO ASSISTED THORACOSCOPY;  Surgeon: Melrose Nakayama, MD;  Location: [place];  Service: Thoracic;  Laterality: Left;    Outpatient Encounter Prescriptions as of 04/08/2016  Medication Sig  . albuterol (PROAIR HFA) 108 (90 Base) MCG/ACT inhaler Inhale 2 puffs into the lungs every 6 (six) hours as needed for wheezing or shortness of breath.   Marland Kitchen alendronate (FOSAMAX) 70 MG tablet Take 1 tablet (70 mg total) by mouth once a week. Take with a full glass of water on an empty stomach.  Marland Kitchen aspirin EC 81 MG tablet Take 81 mg by mouth daily.  Marland Kitchen atorvastatin (LIPITOR) 40 MG tablet Take 40 mg by mouth at bedtime.  . budesonide-formoterol (SYMBICORT) 160-4.5 MCG/ACT inhaler Inhale 2 puffs into the lungs 2 (two) times daily.  . carvedilol (COREG) 6.25 MG tablet Take 1 tablet (6.25 mg total) by mouth 2 (two) times daily with a meal.  . cephALEXin (KEFLEX) 500 MG capsule Take 1 capsule (500 mg total) by mouth 2 (two) times daily. Stop date 10/18  . Cholecalciferol (VITAMIN D3) 400 units tablet Take 1 tablet (400 Units total) by mouth daily.  Marland Kitchen lisinopril (PRINIVIL,ZESTRIL) 5 MG tablet Take 5 mg by mouth daily.  . methimazole (TAPAZOLE) 5 MG tablet Take 15 mg by mouth every evening.  . ranitidine (ZANTAC) 150 MG tablet Take 150 mg by mouth 2 (two) times daily.  Marland Kitchen senna-docusate (SENOKOT-S) 8.6-50 MG tablet Take 1 tablet by mouth 2 (two) times daily.   . traMADol (ULTRAM) 50 MG tablet Take 50 mg by mouth every 6 (six) hours as  needed.  . warfarin (COUMADIN) 5 MG tablet Take 1 tablet (5 mg total) by mouth daily at 6 PM.  . warfarin (COUMADIN) 5 MG tablet Take 1 tablet (5 mg total) by mouth every evening. (Patient taking differently: Take 2.5 mg by mouth once. Tuesday, Thursday, Saturday, Sunday)  . [DISCONTINUED] HYDROcodone-acetaminophen (NORCO) 10-325 MG tablet Take 1-2 tablets by mouth every 6 (six) hours as needed for moderate pain or severe pain.  . [DISCONTINUED] methocarbamol (ROBAXIN) 500 MG tablet Take 1 tablet (500 mg total) by mouth every 6 (six) hours as needed for muscle spasms.  . [DISCONTINUED] oxyCODONE (OXY IR/ROXICODONE) 5 MG immediate release tablet Take 1 tablet (5 mg total) by mouth every 4 (four) hours as needed for breakthrough pain ((for MODERATE breakthrough pain)).  . [DISCONTINUED] diclofenac (VOLTAREN) 75 MG EC tablet Take 1 tablet (75 mg total) by mouth 2 (two) times daily.   No facility-administered encounter medications on file as of 04/08/2016.     Allergies: Tetanus toxoid adsorbed  Social History   Social History  . Marital status: Married    Spouse name: Sherwood  . Number of children: 0  . Years of education: N/A   Occupational History  . Retired in 2004    Social History Main Topics  . Smoking status: Former Smoker    Quit date: 03/01/2010  . Smokeless tobacco: Never Used  .  Alcohol use No  . Drug use: No  . Sexual activity: Not on file   Other Topics Concern  . Not on file   Social History Narrative   Lives with husband.  Ambulated independently.    Family History  Problem Relation Age of Onset  . Diabetes Mother   . Heart attack Mother   . Diabetes Father   . Lung cancer Father   . Diabetes Sister   . Diabetes Sister   . HIV Brother     Review of Systems: A 12-system review of systems was performed and was negative except as noted in the HPI.  --------------------------------------------------------------------------------------------------  Physical  Exam: BP 130/86   Pulse 92   Ht 5\' 7"  (1.702 m)   Wt 205 lb 6.4 oz (93.2 kg)   BMI 32.17 kg/m   General:  Obese woman, seated comfortably in wheelchair. HEENT: No conjunctival pallor or scleral icterus.  Moist mucous membranes.  OP clear. Neck: Supple without lymphadenopathy, thyromegaly, JVD, or HJR.  No carotid bruit. Lungs: Normal work of breathing.  Diminished breath sounds in the left lung.  No wheezes or crackles. Heart: Regular rate and rhythm without murmurs, rubs, or gallops.  Unable to assess PMI due to body habitus. Abd: Bowel sounds present.  Soft, NT/ND without hepatosplenomegaly Ext: No lower extremity edema.  Radial, PT, and DP pulses are 2+ bilaterally Skin: warm and dry without rash Neuro: CNIII-XII intact.  Strength and fine-touch sensation intact in upper and lower extremities bilaterally. Psych: Normal mood and affect.  EKG:  Normal sinus rhythm with PACs and PVCs, LVH, and inferior and anterolateral Q-waves. No significant change from prior tracing on 11/28/15 (I have personally reviewed the tracing).  Lab Results  Component Value Date   WBC 12.9 (H) 12/13/2015   HGB 8.5 (L) 12/13/2015   HCT 25.6 (L) 12/13/2015   MCV 91.8 12/13/2015   PLT 118 (L) 12/13/2015    Lab Results  Component Value Date   NA 136 12/12/2015   K 4.0 12/12/2015   CL 107 12/12/2015   CO2 21 (L) 12/12/2015   BUN 10 12/12/2015   CREATININE 0.91 12/12/2015   GLUCOSE 105 (H) 12/12/2015   ALT 34 12/10/2015    No results found for: CHOL, HDL, LDLCALC, LDLDIRECT, TRIG, CHOLHDL   --------------------------------------------------------------------------------------------------  ASSESSMENT AND PLAN: Aortic valve disease status post bioprosthetic aortic valve replacement Patient has chronic dyspnea that is likely multifactorial but otherwise looks well-compensated. Most recent TTE in 03/2015 reportedly showed normal valve function. Given continued dyspnea and decline in LVEF in the past,  we ill repeat an echo today. Patient will need antibiotic prophylaxis for any dental procedures. We will request records from [facility] in [place], [place] regarding her surgery and other cardiac procedures (e.g. LHC).  Non-ischemic cardiomyopathy with chronic systolic heart failure Patient appears euvolemic but reports stable exertional dyspnea with mild activity, consistent with NYHA class III heart failure. We will repeat an echocardiogram to reevaluate her LV function. Given her chronic non-productive cough, we will switch lisinopril to losartan 25 mg daily. We will also increase carvedilol to 12.5 mg BID. If LVEF remains severely reduced, we could consider switching losartan to Entresto in the future as well and adding spironolactone.  Paroxysmal atrial fibrillation EKG today demonstrates normal sinus rhythm. We will check an INR today and have the patient begin routine follow-up in our anticoagulation clinic. If her INR is therapeutic, it would be reasonable to discontinue aspirin to minimize risk for bleeding with  lifelong warfarin therapy.  Essential hypertension Blood pressure is borderline elevated. As above, we will increase carvedilol and switch lisinopril to losartan.  Follow-up: Return to clinic in 3 months  [name], MD 04/08/2016 9:55 AM

## 2016-04-08 NOTE — Patient Instructions (Addendum)
Medication Instructions:  Stop lisinopril.  Start losartan 25mg  daily.  Increase coreg (carvedilol) to 12.5 mg two times a day. You can take 2 of your 6.25mg  tablets two times a day and use your current supply.  Labwork: BMET/CBCd/PT/INR today  Testing/Procedures: Your physician has requested that you have an echocardiogram. Echocardiography is a painless test that uses sound waves to create images of your heart. It provides your doctor with information about the size and shape of your heart and how well your heart's chambers and valves are working. This procedure takes approximately one hour. There are no restrictions for this procedure.    Follow-Up: You have been referred to the Coumadin Clinic in our office for management of your coumadin--new patient next week per [facility].  Your physician recommends that you schedule a follow-up appointment in: 3 months with Dr End.     Any Other Special Instructions Will Be Listed Below (If Applicable).  Please ask for a list of Primary Care Doctors when you go to check out.   If you need a refill on your cardiac medications before your next appointment, please call your pharmacy.

## 2016-04-09 LAB — PROTIME-INR
INR: 2.9 — ABNORMAL HIGH (ref 0.8–1.2)
Prothrombin Time: 28.3 s — ABNORMAL HIGH (ref 9.1–12.0)

## 2016-04-09 LAB — CBC WITH DIFFERENTIAL/PLATELET
BASOS: 0 %
Basophils Absolute: 0 10*3/uL (ref 0.0–0.2)
EOS (ABSOLUTE): 0.2 10*3/uL (ref 0.0–0.4)
EOS: 3 %
HEMOGLOBIN: 13.1 g/dL (ref 11.1–15.9)
Hematocrit: 37.9 % (ref 34.0–46.6)
Immature Grans (Abs): 0 10*3/uL (ref 0.0–0.1)
Immature Granulocytes: 0 %
LYMPHS ABS: 1.6 10*3/uL (ref 0.7–3.1)
Lymphs: 26 %
MCH: 31.6 pg (ref 26.6–33.0)
MCHC: 34.6 g/dL (ref 31.5–35.7)
MCV: 92 fL (ref 79–97)
MONOS ABS: 0.4 10*3/uL (ref 0.1–0.9)
Monocytes: 7 %
NEUTROS ABS: 3.9 10*3/uL (ref 1.4–7.0)
Neutrophils: 64 %
Platelets: 136 10*3/uL — ABNORMAL LOW (ref 150–379)
RBC: 4.14 x10E6/uL (ref 3.77–5.28)
RDW: 14.9 % (ref 12.3–15.4)
WBC: 6.1 10*3/uL (ref 3.4–10.8)

## 2016-04-09 LAB — BASIC METABOLIC PANEL
BUN/Creatinine Ratio: 17 (ref 12–28)
BUN: 18 mg/dL (ref 8–27)
CHLORIDE: 104 mmol/L (ref 96–106)
CO2: 23 mmol/L (ref 18–29)
Calcium: 9.9 mg/dL (ref 8.7–10.3)
Creatinine, Ser: 1.05 mg/dL — ABNORMAL HIGH (ref 0.57–1.00)
GFR calc non Af Amer: 54 mL/min/{1.73_m2} — ABNORMAL LOW (ref >59)
GFR, EST AFRICAN AMERICAN: 62 mL/min/{1.73_m2} (ref >59)
GLUCOSE: 81 mg/dL (ref 65–99)
POTASSIUM: 5.1 mmol/L (ref 3.5–5.2)
SODIUM: 140 mmol/L (ref 134–144)

## 2016-04-10 DIAGNOSIS — I428 Other cardiomyopathies: Secondary | ICD-10-CM | POA: Insufficient documentation

## 2016-04-10 DIAGNOSIS — Z952 Presence of prosthetic heart valve: Secondary | ICD-10-CM | POA: Insufficient documentation

## 2016-04-10 DIAGNOSIS — I359 Nonrheumatic aortic valve disorder, unspecified: Secondary | ICD-10-CM | POA: Insufficient documentation

## 2016-04-10 DIAGNOSIS — Z953 Presence of xenogenic heart valve: Secondary | ICD-10-CM | POA: Insufficient documentation

## 2016-04-14 ENCOUNTER — Telehealth: Payer: Self-pay | Admitting: *Deleted

## 2016-04-14 ENCOUNTER — Ambulatory Visit (INDEPENDENT_AMBULATORY_CARE_PROVIDER_SITE_OTHER): Payer: Medicare Other | Admitting: *Deleted

## 2016-04-14 DIAGNOSIS — Z952 Presence of prosthetic heart valve: Secondary | ICD-10-CM | POA: Diagnosis not present

## 2016-04-14 DIAGNOSIS — R339 Retention of urine, unspecified: Secondary | ICD-10-CM | POA: Diagnosis not present

## 2016-04-14 DIAGNOSIS — J449 Chronic obstructive pulmonary disease, unspecified: Secondary | ICD-10-CM | POA: Diagnosis not present

## 2016-04-14 DIAGNOSIS — E785 Hyperlipidemia, unspecified: Secondary | ICD-10-CM | POA: Diagnosis not present

## 2016-04-14 DIAGNOSIS — I639 Cerebral infarction, unspecified: Secondary | ICD-10-CM | POA: Insufficient documentation

## 2016-04-14 DIAGNOSIS — I2510 Atherosclerotic heart disease of native coronary artery without angina pectoris: Secondary | ICD-10-CM | POA: Diagnosis not present

## 2016-04-14 DIAGNOSIS — E039 Hypothyroidism, unspecified: Secondary | ICD-10-CM | POA: Diagnosis not present

## 2016-04-14 DIAGNOSIS — D509 Iron deficiency anemia, unspecified: Secondary | ICD-10-CM | POA: Diagnosis not present

## 2016-04-14 DIAGNOSIS — Z5181 Encounter for therapeutic drug level monitoring: Secondary | ICD-10-CM | POA: Diagnosis not present

## 2016-04-14 DIAGNOSIS — I480 Paroxysmal atrial fibrillation: Secondary | ICD-10-CM

## 2016-04-14 DIAGNOSIS — M810 Age-related osteoporosis without current pathological fracture: Secondary | ICD-10-CM | POA: Diagnosis not present

## 2016-04-14 DIAGNOSIS — K219 Gastro-esophageal reflux disease without esophagitis: Secondary | ICD-10-CM | POA: Diagnosis not present

## 2016-04-14 DIAGNOSIS — I509 Heart failure, unspecified: Secondary | ICD-10-CM | POA: Diagnosis not present

## 2016-04-14 DIAGNOSIS — S7292XD Unspecified fracture of left femur, subsequent encounter for closed fracture with routine healing: Secondary | ICD-10-CM | POA: Diagnosis not present

## 2016-04-14 LAB — POCT INR: INR: 3.7

## 2016-04-14 NOTE — Telephone Encounter (Signed)
Spoke with pt and instructed her to discontinue her ASA 81mg  daily  per Dr Darnelle Bos  orders and she states understanding.

## 2016-04-14 NOTE — Telephone Encounter (Signed)
-----   Message from [name], MD sent at 04/14/2016  4:22 PM EST ----- Carlyon Shadow,  Thanks for your message. I think it is fine to discontinue her aspirin. Please let me know if any other questions or concerns arise.  Gerald Stabs  ----- Message ----- From: Margretta Sidle, RN Sent: 04/14/2016   3:57 PM To: [name], MD  Dr End  Saw pt for first time in the coumadin clinic and her INR was 3.7 Do you want to discontinue her ASA 81 mg daily Please advise Thank you Elbert Ewings RN

## 2016-04-14 NOTE — Patient Instructions (Signed)

## 2016-04-20 DIAGNOSIS — I480 Paroxysmal atrial fibrillation: Secondary | ICD-10-CM | POA: Diagnosis not present

## 2016-04-20 DIAGNOSIS — E039 Hypothyroidism, unspecified: Secondary | ICD-10-CM | POA: Diagnosis not present

## 2016-04-20 DIAGNOSIS — E785 Hyperlipidemia, unspecified: Secondary | ICD-10-CM | POA: Diagnosis not present

## 2016-04-20 DIAGNOSIS — D509 Iron deficiency anemia, unspecified: Secondary | ICD-10-CM | POA: Diagnosis not present

## 2016-04-20 DIAGNOSIS — I509 Heart failure, unspecified: Secondary | ICD-10-CM | POA: Diagnosis not present

## 2016-04-20 DIAGNOSIS — Z5181 Encounter for therapeutic drug level monitoring: Secondary | ICD-10-CM | POA: Diagnosis not present

## 2016-04-20 DIAGNOSIS — I2510 Atherosclerotic heart disease of native coronary artery without angina pectoris: Secondary | ICD-10-CM | POA: Diagnosis not present

## 2016-04-20 DIAGNOSIS — K219 Gastro-esophageal reflux disease without esophagitis: Secondary | ICD-10-CM | POA: Diagnosis not present

## 2016-04-20 DIAGNOSIS — M810 Age-related osteoporosis without current pathological fracture: Secondary | ICD-10-CM | POA: Diagnosis not present

## 2016-04-20 DIAGNOSIS — Z9181 History of falling: Secondary | ICD-10-CM | POA: Diagnosis not present

## 2016-04-20 DIAGNOSIS — R339 Retention of urine, unspecified: Secondary | ICD-10-CM | POA: Diagnosis not present

## 2016-04-20 DIAGNOSIS — J449 Chronic obstructive pulmonary disease, unspecified: Secondary | ICD-10-CM | POA: Diagnosis not present

## 2016-04-20 DIAGNOSIS — Z7901 Long term (current) use of anticoagulants: Secondary | ICD-10-CM | POA: Diagnosis not present

## 2016-04-20 DIAGNOSIS — S7292XD Unspecified fracture of left femur, subsequent encounter for closed fracture with routine healing: Secondary | ICD-10-CM | POA: Diagnosis not present

## 2016-04-21 ENCOUNTER — Other Ambulatory Visit: Payer: Self-pay

## 2016-04-21 ENCOUNTER — Ambulatory Visit (HOSPITAL_COMMUNITY): Payer: Medicare Other | Attending: Internal Medicine

## 2016-04-21 ENCOUNTER — Ambulatory Visit (INDEPENDENT_AMBULATORY_CARE_PROVIDER_SITE_OTHER): Payer: Medicare Other | Admitting: *Deleted

## 2016-04-21 DIAGNOSIS — Z952 Presence of prosthetic heart valve: Secondary | ICD-10-CM | POA: Diagnosis not present

## 2016-04-21 DIAGNOSIS — Z953 Presence of xenogenic heart valve: Secondary | ICD-10-CM | POA: Insufficient documentation

## 2016-04-21 DIAGNOSIS — I480 Paroxysmal atrial fibrillation: Secondary | ICD-10-CM | POA: Diagnosis not present

## 2016-04-21 DIAGNOSIS — I2510 Atherosclerotic heart disease of native coronary artery without angina pectoris: Secondary | ICD-10-CM | POA: Insufficient documentation

## 2016-04-21 DIAGNOSIS — I428 Other cardiomyopathies: Secondary | ICD-10-CM | POA: Diagnosis not present

## 2016-04-21 DIAGNOSIS — R0602 Shortness of breath: Secondary | ICD-10-CM | POA: Diagnosis not present

## 2016-04-21 DIAGNOSIS — R0600 Dyspnea, unspecified: Secondary | ICD-10-CM | POA: Insufficient documentation

## 2016-04-21 LAB — POCT INR: INR: 4.5

## 2016-04-27 DIAGNOSIS — Z9181 History of falling: Secondary | ICD-10-CM | POA: Diagnosis not present

## 2016-04-27 DIAGNOSIS — J449 Chronic obstructive pulmonary disease, unspecified: Secondary | ICD-10-CM | POA: Diagnosis not present

## 2016-04-27 DIAGNOSIS — E785 Hyperlipidemia, unspecified: Secondary | ICD-10-CM | POA: Diagnosis not present

## 2016-04-27 DIAGNOSIS — Z7901 Long term (current) use of anticoagulants: Secondary | ICD-10-CM | POA: Diagnosis not present

## 2016-04-27 DIAGNOSIS — D509 Iron deficiency anemia, unspecified: Secondary | ICD-10-CM | POA: Diagnosis not present

## 2016-04-27 DIAGNOSIS — S7292XD Unspecified fracture of left femur, subsequent encounter for closed fracture with routine healing: Secondary | ICD-10-CM | POA: Diagnosis not present

## 2016-04-27 DIAGNOSIS — Z5181 Encounter for therapeutic drug level monitoring: Secondary | ICD-10-CM | POA: Diagnosis not present

## 2016-04-27 DIAGNOSIS — I2510 Atherosclerotic heart disease of native coronary artery without angina pectoris: Secondary | ICD-10-CM | POA: Diagnosis not present

## 2016-04-27 DIAGNOSIS — E039 Hypothyroidism, unspecified: Secondary | ICD-10-CM | POA: Diagnosis not present

## 2016-04-27 DIAGNOSIS — R339 Retention of urine, unspecified: Secondary | ICD-10-CM | POA: Diagnosis not present

## 2016-04-27 DIAGNOSIS — K219 Gastro-esophageal reflux disease without esophagitis: Secondary | ICD-10-CM | POA: Diagnosis not present

## 2016-04-27 DIAGNOSIS — I509 Heart failure, unspecified: Secondary | ICD-10-CM | POA: Diagnosis not present

## 2016-04-27 DIAGNOSIS — I480 Paroxysmal atrial fibrillation: Secondary | ICD-10-CM | POA: Diagnosis not present

## 2016-04-27 DIAGNOSIS — M810 Age-related osteoporosis without current pathological fracture: Secondary | ICD-10-CM | POA: Diagnosis not present

## 2016-04-28 ENCOUNTER — Ambulatory Visit (INDEPENDENT_AMBULATORY_CARE_PROVIDER_SITE_OTHER): Payer: Medicare Other | Admitting: *Deleted

## 2016-04-28 DIAGNOSIS — I480 Paroxysmal atrial fibrillation: Secondary | ICD-10-CM

## 2016-04-28 DIAGNOSIS — Z952 Presence of prosthetic heart valve: Secondary | ICD-10-CM

## 2016-04-28 LAB — POCT INR: INR: 3.2

## 2016-04-29 ENCOUNTER — Telehealth: Payer: Self-pay | Admitting: *Deleted

## 2016-04-29 NOTE — Telephone Encounter (Signed)
Pt called to inform CV[facility] that she cannot afford INR visits. She stated [place]. Advised that she should find out if her PCP checks INR & doses her Coumadin & if so that could save her money. Advised to callback if her PCP takes over & she verbalizes understanding. She will see her PCP on Tuesday, March 6th & will update accordingly.

## 2016-04-30 DIAGNOSIS — R269 Unspecified abnormalities of gait and mobility: Secondary | ICD-10-CM | POA: Diagnosis not present

## 2016-04-30 DIAGNOSIS — J45909 Unspecified asthma, uncomplicated: Secondary | ICD-10-CM | POA: Diagnosis not present

## 2016-05-03 DIAGNOSIS — Z9181 History of falling: Secondary | ICD-10-CM | POA: Diagnosis not present

## 2016-05-03 DIAGNOSIS — S7292XD Unspecified fracture of left femur, subsequent encounter for closed fracture with routine healing: Secondary | ICD-10-CM | POA: Diagnosis not present

## 2016-05-03 DIAGNOSIS — I480 Paroxysmal atrial fibrillation: Secondary | ICD-10-CM | POA: Diagnosis not present

## 2016-05-03 DIAGNOSIS — Z7901 Long term (current) use of anticoagulants: Secondary | ICD-10-CM | POA: Diagnosis not present

## 2016-05-03 DIAGNOSIS — E039 Hypothyroidism, unspecified: Secondary | ICD-10-CM | POA: Diagnosis not present

## 2016-05-03 DIAGNOSIS — J449 Chronic obstructive pulmonary disease, unspecified: Secondary | ICD-10-CM | POA: Diagnosis not present

## 2016-05-03 DIAGNOSIS — I2510 Atherosclerotic heart disease of native coronary artery without angina pectoris: Secondary | ICD-10-CM | POA: Diagnosis not present

## 2016-05-03 DIAGNOSIS — K219 Gastro-esophageal reflux disease without esophagitis: Secondary | ICD-10-CM | POA: Diagnosis not present

## 2016-05-03 DIAGNOSIS — Z5181 Encounter for therapeutic drug level monitoring: Secondary | ICD-10-CM | POA: Diagnosis not present

## 2016-05-03 DIAGNOSIS — R339 Retention of urine, unspecified: Secondary | ICD-10-CM | POA: Diagnosis not present

## 2016-05-03 DIAGNOSIS — I509 Heart failure, unspecified: Secondary | ICD-10-CM | POA: Diagnosis not present

## 2016-05-03 DIAGNOSIS — M810 Age-related osteoporosis without current pathological fracture: Secondary | ICD-10-CM | POA: Diagnosis not present

## 2016-05-03 DIAGNOSIS — E785 Hyperlipidemia, unspecified: Secondary | ICD-10-CM | POA: Diagnosis not present

## 2016-05-03 DIAGNOSIS — D509 Iron deficiency anemia, unspecified: Secondary | ICD-10-CM | POA: Diagnosis not present

## 2016-05-04 ENCOUNTER — Encounter: Payer: Self-pay | Admitting: Family Medicine

## 2016-05-04 ENCOUNTER — Ambulatory Visit: Payer: Medicare Other

## 2016-05-04 ENCOUNTER — Ambulatory Visit (INDEPENDENT_AMBULATORY_CARE_PROVIDER_SITE_OTHER): Payer: Medicare Other | Admitting: Family Medicine

## 2016-05-04 VITALS — BP 150/90 | HR 91 | Resp 12 | Ht 67.0 in | Wt 216.0 lb

## 2016-05-04 DIAGNOSIS — Z7901 Long term (current) use of anticoagulants: Secondary | ICD-10-CM | POA: Insufficient documentation

## 2016-05-04 DIAGNOSIS — E0590 Thyrotoxicosis, unspecified without thyrotoxic crisis or storm: Secondary | ICD-10-CM

## 2016-05-04 DIAGNOSIS — R2681 Unsteadiness on feet: Secondary | ICD-10-CM | POA: Diagnosis not present

## 2016-05-04 DIAGNOSIS — M810 Age-related osteoporosis without current pathological fracture: Secondary | ICD-10-CM

## 2016-05-04 DIAGNOSIS — M159 Polyosteoarthritis, unspecified: Secondary | ICD-10-CM | POA: Diagnosis not present

## 2016-05-04 DIAGNOSIS — I10 Essential (primary) hypertension: Secondary | ICD-10-CM | POA: Diagnosis not present

## 2016-05-04 MED ORDER — HYDROCODONE-ACETAMINOPHEN 5-325 MG PO TABS: 1.0000 | ORAL_TABLET | Freq: Two times a day (BID) | ORAL | 0 refills | Status: DC | PRN

## 2016-05-04 MED ORDER — RISEDRONATE SODIUM 150 MG PO TABS: 150.0000 mg | ORAL_TABLET | ORAL | 1 refills | Status: DC

## 2016-05-04 MED ORDER — DICLOFENAC SODIUM 1 % TD GEL: 4.0000 g | Freq: Four times a day (QID) | TRANSDERMAL | 3 refills | Status: DC

## 2016-05-04 NOTE — Progress Notes (Signed)
HPI:   Danielle.Danielle [name] is a 71 y.o. female, who is here today to establish care.  Former PCP: Dr Zettie Pho Last preventive routine visit: over a year ago.  Chronic medical problems: Chronic pain, OA, atrial fib on chronic anticoagulation,HTN,COPD among some.  Hyperthyroidism:  Dx 5-6 years ago. Currently she is on Methimazole 5 mg daily.She has not followed with endocrinologists since she moved to this area,over a year ago.Reviewing records referral was placed by former PCP, she states that she was not aware and did not receive appt information. Tolerating medication well, no side effects reported. She has not noted dysphagia, palpitations, abdominal pain, changes in bowel habits, tremor, cold/heat intolerance, or abnormal weight loss. Reporting last TSH in 01/2016. I can TSH 04/2015 of 0.5.  Hx of HTN,she is currently on Losartan 25 mg daily,Carvedilol 12.5 mg bid. She takes Lasix for LE edema. S/P CVA with minimal residual weakness right side and slurred speech. Denies severe/frequent headache, visual changes, chest pain, claudication, new focal weakness, or worsening edema.  Echo 03/2015 LVEF 30-35% 04/2014 LVEF 45-50%  + Orthopnea,sleep on 3 pillows,stable.Denies PND.  Concerns today: Pain,INR,HH request,Fosamax,cough among some.  Chronic pain: Hx of knee OA,L>R. Recently she suffered hip fracture after fall at home,she tripped with corner of her bed and fell. She was following with Dr Xu,ortho,recently discharged after completing PT. Pain has improved greatly, leg feels "heavy" but attributed to hardware. Left intertrochanteric femur fracture with intramedullary nail fixation on 12/10/15.  She is c/o persistent right hip pain that starts on anterior aspect of hip and radiates to anterior aspect of thigh.She attributes pain to stress applying to RLE muscles when she was recovering from left hip fracture.She is not able to walk due to pain,she is currently in a  wheel chair. Pain started in 01/2016, it is "bad",pain keeps her from sleep. Constant, exacerbated by standing up and walk,alliviated some by rest.  She has occasional lower back pain but no more than usual. Denies numbness,tingling,or saddle anesthesia. Pain is stable otherwise.  Hx of knee OA,she has been on Tramadol before for pain management,requesting Rx refill.  -She is on Coumadin due to atrial fib ,als Hx of valve disease,s/p valve replacement (bioprosthetic aortic valve). Her last INR a week ago was 3.2,she was supposed to follow tomorrow but she is not keeping appt.She would like HH to continue checking INR at home.Becasue of pain and transportation issues she cannot go weekly. She denies gross hematuria,blood in stool,nose.gum bleed.  Osteoporosis:  According to pt,she was instructed to stop Fosamax because it was causing her to cough. Non productive cough for a few months now (about 5 months).  Treatment was started in 03/2015. She would like to try a different oral med that she can take monthly.  Spontaneous pneumothorax 11/2015. Hx of COPD,she follows with pulmonologist (Dr Melvyn Novas), chronic exertional dyspnea, not recently since she is not very active due to pain. Hx of GERD,she is on Zantac    Review of Systems  Constitutional: Positive for activity change and fatigue. Negative for appetite change, fever and unexpected weight change.  HENT: Negative for mouth sores, nosebleeds and trouble swallowing.   Eyes: Negative for redness and visual disturbance.  Respiratory: Positive for cough. Negative for shortness of breath and wheezing.   Cardiovascular: Negative for chest pain, palpitations and leg swelling.  Gastrointestinal: Negative for abdominal pain, nausea and vomiting.       Negative for changes in bowel habits.  Endocrine: Negative for cold  intolerance and heat intolerance.  Genitourinary: Negative for decreased urine volume and hematuria.  Musculoskeletal:  Positive for arthralgias and gait problem.  Skin: Negative for rash.  Neurological: Negative for syncope, weakness and headaches.  Psychiatric/Behavioral: Positive for sleep disturbance. Negative for confusion. The patient is nervous/anxious.       Current Outpatient Prescriptions on File Prior to Visit  Medication Sig Dispense Refill  . albuterol (PROAIR HFA) 108 (90 Base) MCG/ACT inhaler Inhale 2 puffs into the lungs every 6 (six) hours as needed for wheezing or shortness of breath.     Marland Kitchen alendronate (FOSAMAX) 70 MG tablet Take 1 tablet (70 mg total) by mouth once a week. Take with a full glass of water on an empty stomach. 4 tablet 0  . atorvastatin (LIPITOR) 40 MG tablet Take 40 mg by mouth at bedtime.    . budesonide-formoterol (SYMBICORT) 160-4.5 MCG/ACT inhaler Inhale 2 puffs into the lungs 2 (two) times daily. 1 Inhaler 0  . carvedilol (COREG) 12.5 MG tablet Take 1 tablet (12.5 mg total) by mouth 2 (two) times daily. 180 tablet 1  . Cholecalciferol (VITAMIN D3) 400 units tablet Take 1 tablet (400 Units total) by mouth daily. 30 tablet 0  . furosemide (LASIX) 20 MG tablet Take 20 mg by mouth daily as needed for fluid or edema.    Marland Kitchen losartan (COZAAR) 25 MG tablet Take 1 tablet (25 mg total) by mouth daily. 90 tablet 1  . methimazole (TAPAZOLE) 5 MG tablet Take 15 mg by mouth every evening.    . ranitidine (ZANTAC) 150 MG tablet Take 150 mg by mouth 2 (two) times daily.    Marland Kitchen senna-docusate (SENOKOT-S) 8.6-50 MG tablet Take 1 tablet by mouth 2 (two) times daily.     . traMADol (ULTRAM) 50 MG tablet Take 50 mg by mouth every 6 (six) hours as needed.    . warfarin (COUMADIN) 5 MG tablet 1 tablet (5mg )  by mouth on M-W-F, 1/2 tablet( 2.5mg ) by mouth on Tu-Th-Sat-Sun 90 tablet 3   No current facility-administered medications on file prior to visit.      Past Medical History:  Diagnosis Date  . Aortic stenosis    Status post bioprosthetic AVR  . Arthritis    DJD  . Asthma   .  Chronic systolic CHF (congestive heart failure) (HCC)    EF 30% 04/2014  . Coronary artery disease   . Gastritis   . Hyperlipidemia   . Hypertension   . Hyperthyroidism   . Multiple thyroid nodules   . Pelvis fracture ([place]) 08/19/2015   MULTIPLE   . Spontaneous pneumothorax 11/28/2015   left   . Stroke [facility])    Allergies  Allergen Reactions  . Lisinopril Cough  . Tetanus Toxoid Adsorbed Swelling    Unknown     Family History  Problem Relation Age of Onset  . Diabetes Mother   . Heart attack Mother 18  . Diabetes Father   . Lung cancer Father   . Diabetes Sister   . Diabetes Sister   . HIV Brother     Social History   Social History  . Marital status: Married    Spouse name: Danielle [name]  . Number of children: 0  . Years of education: N/A   Occupational History  . Retired in 2004    Social History Main Topics  . Smoking status: Former Smoker    Packs/day: 0.25    Years: 10.00    Types: Cigarettes    Quit  date: 03/01/2010  . Smokeless tobacco: Never Used  . Alcohol use No  . Drug use: No  . Sexual activity: Not Asked   Other Topics Concern  . None   Social History Narrative   Lives with husband.  Ambulated independently.    Vitals:   05/04/16 1523  BP: (!) 150/90  Pulse: 91  Resp: 12   O2 sat 97% at RA. Body mass index is 33.83 kg/m.   Physical Exam  Nursing note and vitals reviewed. Constitutional: She is oriented to person, place, and time. She appears well-developed. No distress (unless she tries to get up becasue of pain).  HENT:  Head: Atraumatic.  Mouth/Throat: Oropharynx is clear and moist and mucous membranes are normal.  Eyes: Conjunctivae and EOM are normal. Pupils are equal, round, and reactive to light.  Neck: No tracheal deviation present. Thyromegaly present. No thyroid mass present.  Cardiovascular: Normal rate and regular rhythm.   No murmur heard. Pulses:      Dorsalis pedis pulses are 2+ on the right side, and 2+ on the left  side.  Respiratory: Effort normal and breath sounds normal. No respiratory distress.  GI: Soft. There is no tenderness.  Musculoskeletal: She exhibits no edema.  Exam done while she was in her wheel chair,she cannot get on exam table due to right hip pain mainly. Pain of right hip with minimal active and passive ROM, limited due to pain.Left knee crepitus.  No tenderness upon palpation of paraspinal muscles,thoracic and lumbar ,bilateral.  Lymphadenopathy:    She has no cervical adenopathy.  Neurological: She is alert and oriented to person, place, and time. She has normal strength. Coordination normal.  Wheel chair.  Skin: Skin is warm. No erythema.  Psychiatric: Her mood appears anxious. Her affect is labile.  Well groomed, good eye contact.      ASSESSMENT AND PLAN:   Danielle [name] was seen today for establish care.  Diagnoses and all orders for this visit:  Generalized osteoarthritis of multiple sites  Pain is not well controlled. She has been on Tramadol,Hydrocodone,and Oxycodone according to records.Becasue I amy be adding SSRI or SNRI I am not prescribing Tramadol,also could aggravate depressed mood. Hydrocodone-Acetaminophen 5-325 mg  Side effects discussed. Topical Voltaren gel also may help.  Fall precautions. PT will be arranged through [facility]. SCAT form for transportation to fill out,she brought form. F/U in 2-3 weeks.  -     HYDROcodone-acetaminophen (NORCO/VICODIN) 5-325 MG tablet; Take 1 tablet by mouth every 12 (twelve) hours as needed for moderate pain. -     diclofenac sodium (VOLTAREN) 1 % GEL; Apply 4 g topically 4 (four) times daily. -     Ambulatory referral to [place] hypertension  Elevated today,reporting "good" BP's at home. Continue monitoring. Possible complications of elevated BP discussed. Low salt diet. F/U in 2-3 weeks.   Hyperthyroidism  She will continue following with endocrinologists.  -     Ambulatory referral to  Endocrinology  Osteoporosis, unspecified osteoporosis type, unspecified pathological fracture presence  Treatment options discussed, she is not interested for now in Prolia, she would like medication she can take monthly. We dicussed some side effects,many similar to Fosamax. Fall precautions. Ca++ and Vit D supplementation to continue. Hx of vit D deficiency,will plan on re-checking with next lab work.  Other possible causes of cough discussed.   -     risedronate (ACTONEL) 150 MG tablet; Take 1 tablet (150 mg total) by mouth every 30 (thirty) days. with  water on empty stomach, nothing by mouth or lie down for next 30 minutes.  Unstable gait  Fall precautions discussed. PT at home through [facility].  -     Ambulatory referral to [facility]  Chronic anticoagulation  INR not at [facility]. INR to continue through [facility] for now. No changes in current Coumadin dose.   OV face to face 45 min,> 50% dedicated to coordination of care,discussion of med side effects. Danielle [name] could not do Medicare preventive visit today due to time and multiple concerns addressed today.   Danielle Horsey G. [name], MD  [facility] [place] office.

## 2016-05-04 NOTE — Progress Notes (Signed)
This patient presented for AWV with Dr. [name] today Complicated health history and c/o of pain from left hip fx and states right leg is now compromised and she cannot move it as well. At risk for falls. Is alone during the day but spouse does help her in the shower which is handicapped accessible with grab bars. States PT has stopped but [facility] states they will reauthorize more PT for continued rehab and fall prevention. Has w/c and walker at home. Also re-order [facility] to continue RN visits for INR and PT for continued rehab. Also to fup on meds and education as needed.   Had rx for alendronate; States she had dexa many years ago but has not had one recently. Not sure why she rec'd alendronate but she can't take it.  States she ,moved from [place] and lived with spouse's family in [place] until they bought their home in [place]. Moved care from [facility] to [facility] new patient for Dr. [name] today. States she has Prevnar 13 with shingles in [place]; will try and check on dates prior to coming back to the office.  Had colonoscopy in [place] as well but does not know the date.   Agreed to try to get her medical records and review post hx from [facility] prior to AWV.  Agreed to schedule fup with Dr. [name] in 2 weeks and scheduled for 4/22 at 3: 30 and 4 pm with [name] for AWV.  Care is in place; Encouraged to rest and continue her exercise as instructed. Will review for history and spouse will attend AWV which is postponed today due to lack of health history  Danielle Fines  RN

## 2016-05-04 NOTE — Patient Instructions (Addendum)
A few things to remember from today's visit:   Essential hypertension  Hyperthyroidism - Plan: Ambulatory referral to Endocrinology  Osteoporosis, unspecified osteoporosis type, unspecified pathological fracture presence - Plan: risedronate (ACTONEL) 150 MG tablet  Generalized osteoarthritis of multiple sites - Plan: diclofenac sodium (VOLTAREN) 1 % GEL  Today Hydrocodone started. Fall precautions.  Voltaren topical. Follow in 2 weeks.   Please be sure medication list is accurate. If a new problem present, please set up appointment sooner than planned today.

## 2016-05-04 NOTE — Progress Notes (Signed)
Pre visit review using our clinic review tool, if applicable. No additional management support is needed unless otherwise documented below in the visit note. 

## 2016-05-05 ENCOUNTER — Telehealth: Payer: Self-pay | Admitting: Internal Medicine

## 2016-05-05 ENCOUNTER — Ambulatory Visit: Payer: Self-pay | Admitting: Interventional Cardiology

## 2016-05-05 DIAGNOSIS — I480 Paroxysmal atrial fibrillation: Secondary | ICD-10-CM

## 2016-05-05 DIAGNOSIS — Z952 Presence of prosthetic heart valve: Secondary | ICD-10-CM

## 2016-05-05 NOTE — Telephone Encounter (Signed)
New message      FYI Pt cancelled pt/inr appt.  She said her PCP would be checking it from this day foward

## 2016-05-05 NOTE — Telephone Encounter (Signed)
Verbal order for continue skill nursing 1 x week 9 3 as needed visit for lab draw (INR),weight,check vital signs and train on the telmart machine.

## 2016-05-05 NOTE — Telephone Encounter (Signed)
Pt made inactive for Anticoagulation

## 2016-05-06 ENCOUNTER — Telehealth: Payer: Self-pay

## 2016-05-06 NOTE — Telephone Encounter (Signed)
Submitted PA for Diclofenac gel via fax. Awaiting approval or denial.

## 2016-05-06 NOTE — Telephone Encounter (Signed)
It is OK to proceed with orders as requested. Thanks, BJ

## 2016-05-06 NOTE — Telephone Encounter (Signed)
Verbal given 

## 2016-05-11 ENCOUNTER — Telehealth: Payer: Self-pay

## 2016-05-11 DIAGNOSIS — I2510 Atherosclerotic heart disease of native coronary artery without angina pectoris: Secondary | ICD-10-CM | POA: Diagnosis not present

## 2016-05-11 DIAGNOSIS — Z9181 History of falling: Secondary | ICD-10-CM | POA: Diagnosis not present

## 2016-05-11 DIAGNOSIS — Z7901 Long term (current) use of anticoagulants: Secondary | ICD-10-CM | POA: Diagnosis not present

## 2016-05-11 DIAGNOSIS — E785 Hyperlipidemia, unspecified: Secondary | ICD-10-CM | POA: Diagnosis not present

## 2016-05-11 DIAGNOSIS — K219 Gastro-esophageal reflux disease without esophagitis: Secondary | ICD-10-CM | POA: Diagnosis not present

## 2016-05-11 DIAGNOSIS — E039 Hypothyroidism, unspecified: Secondary | ICD-10-CM | POA: Diagnosis not present

## 2016-05-11 DIAGNOSIS — J449 Chronic obstructive pulmonary disease, unspecified: Secondary | ICD-10-CM | POA: Diagnosis not present

## 2016-05-11 DIAGNOSIS — I480 Paroxysmal atrial fibrillation: Secondary | ICD-10-CM | POA: Diagnosis not present

## 2016-05-11 DIAGNOSIS — Z5181 Encounter for therapeutic drug level monitoring: Secondary | ICD-10-CM | POA: Diagnosis not present

## 2016-05-11 DIAGNOSIS — I509 Heart failure, unspecified: Secondary | ICD-10-CM | POA: Diagnosis not present

## 2016-05-11 DIAGNOSIS — I4891 Unspecified atrial fibrillation: Secondary | ICD-10-CM | POA: Diagnosis not present

## 2016-05-11 DIAGNOSIS — M810 Age-related osteoporosis without current pathological fracture: Secondary | ICD-10-CM | POA: Diagnosis not present

## 2016-05-11 DIAGNOSIS — D509 Iron deficiency anemia, unspecified: Secondary | ICD-10-CM | POA: Diagnosis not present

## 2016-05-11 DIAGNOSIS — S7292XD Unspecified fracture of left femur, subsequent encounter for closed fracture with routine healing: Secondary | ICD-10-CM | POA: Diagnosis not present

## 2016-05-11 DIAGNOSIS — R339 Retention of urine, unspecified: Secondary | ICD-10-CM | POA: Diagnosis not present

## 2016-05-11 NOTE — Telephone Encounter (Signed)
Called and let patient know paper work is ready for pick up. Patient said she would pick up the paperwork on the 22nd when she comes in for her appointment.

## 2016-05-13 ENCOUNTER — Telehealth: Payer: Self-pay | Admitting: Family Medicine

## 2016-05-13 DIAGNOSIS — I428 Other cardiomyopathies: Secondary | ICD-10-CM

## 2016-05-13 DIAGNOSIS — Z952 Presence of prosthetic heart valve: Secondary | ICD-10-CM

## 2016-05-13 DIAGNOSIS — I480 Paroxysmal atrial fibrillation: Secondary | ICD-10-CM

## 2016-05-13 DIAGNOSIS — R0602 Shortness of breath: Secondary | ICD-10-CM

## 2016-05-13 NOTE — Telephone Encounter (Signed)
Pt INR was checked on Tuesday 05/11/16 and is 2.0.  Pt is currently taking 5 MG a day and Lisa @ [facility] would like to know what the pts dosage would be so that she can chart it.

## 2016-05-14 MED ORDER — WARFARIN SODIUM 5 MG PO TABS: ORAL_TABLET | ORAL | 0 refills | Status: DC

## 2016-05-14 NOTE — Telephone Encounter (Addendum)
Left message for the [facility] office to call back to get new Coumadin dosage.

## 2016-05-14 NOTE — Telephone Encounter (Signed)
Called and spoke with Danielle [name] at [facility]. Faxed over rx with new dosage of Coumadin with instructions to retest INR in 10 days. Nothing further needed.

## 2016-05-14 NOTE — Telephone Encounter (Signed)
Colletta Maryland is calling from [facility] to see if Dr. [name] has determined the pts dosage for coumadine.

## 2016-05-14 NOTE — Telephone Encounter (Signed)
Goal 2-3, I believe prior INR was 3.2.  Continue Coumadin 5 mg Sun,Mon,Tue,Wed,andThur and 7.5 mg (1.5 tabs) Saturdays.  INR in 10 days.  Thanks, BJ

## 2016-05-17 ENCOUNTER — Telehealth: Payer: Self-pay | Admitting: Family Medicine

## 2016-05-17 ENCOUNTER — Other Ambulatory Visit: Payer: Self-pay | Admitting: Family Medicine

## 2016-05-17 DIAGNOSIS — D509 Iron deficiency anemia, unspecified: Secondary | ICD-10-CM | POA: Diagnosis not present

## 2016-05-17 DIAGNOSIS — M810 Age-related osteoporosis without current pathological fracture: Secondary | ICD-10-CM

## 2016-05-17 DIAGNOSIS — K219 Gastro-esophageal reflux disease without esophagitis: Secondary | ICD-10-CM | POA: Diagnosis not present

## 2016-05-17 DIAGNOSIS — E039 Hypothyroidism, unspecified: Secondary | ICD-10-CM | POA: Diagnosis not present

## 2016-05-17 DIAGNOSIS — Z5181 Encounter for therapeutic drug level monitoring: Secondary | ICD-10-CM | POA: Diagnosis not present

## 2016-05-17 DIAGNOSIS — R339 Retention of urine, unspecified: Secondary | ICD-10-CM | POA: Diagnosis not present

## 2016-05-17 DIAGNOSIS — I2510 Atherosclerotic heart disease of native coronary artery without angina pectoris: Secondary | ICD-10-CM | POA: Diagnosis not present

## 2016-05-17 DIAGNOSIS — S7292XD Unspecified fracture of left femur, subsequent encounter for closed fracture with routine healing: Secondary | ICD-10-CM | POA: Diagnosis not present

## 2016-05-17 DIAGNOSIS — I509 Heart failure, unspecified: Secondary | ICD-10-CM | POA: Diagnosis not present

## 2016-05-17 DIAGNOSIS — E785 Hyperlipidemia, unspecified: Secondary | ICD-10-CM | POA: Diagnosis not present

## 2016-05-17 DIAGNOSIS — J449 Chronic obstructive pulmonary disease, unspecified: Secondary | ICD-10-CM | POA: Diagnosis not present

## 2016-05-17 DIAGNOSIS — I480 Paroxysmal atrial fibrillation: Secondary | ICD-10-CM | POA: Diagnosis not present

## 2016-05-17 MED ORDER — ALENDRONATE SODIUM 70 MG PO TABS: 70.0000 mg | ORAL_TABLET | ORAL | 2 refills | Status: DC

## 2016-05-17 NOTE — Telephone Encounter (Signed)
Ok to give verbal orders for PT plan. Rx for Fosamax sent to her pharmacy.  Thanks, BJ

## 2016-05-17 NOTE — Telephone Encounter (Signed)
Pt would like to she if she can go back on Alendronate pt state that risedronate is too expensive ($300.00).  Pt would like to have a call if Dr. [name] would agree to this.  Tillie Rung would like to have verbal orders for home health PT 2 week for 6 weeks.

## 2016-05-17 NOTE — Telephone Encounter (Signed)
Left voicemail for Danielle [name] that orders are approved.  Patient is aware Fosamax has been sent to pharmacy.

## 2016-05-19 NOTE — Progress Notes (Signed)
HPI:   Danielle [name] is a 71 y.o. female, who is here today with her husband to follow on recent OV.   She was seen on 05/04/16.  Several concerns addressed last OV, including generalized OA,mainly involving LE joints: Right hip and knee OA. Last OV I agreed with continuing pain management, she was started on Hydrocodone-Acetaminophen 5-325 mg bid prn. She has tolerated medication well,denies side effects.Sometimes she felt like she may need an extra Hydrocodone in the middle of the day,depending of level of activity. Sleeping better.  Pain with medication 3-4/10,constant,exacerbated by walking and prolonged standing. Alleviated by rest. Pain on lower back,right hip,and knees. She is able to walk with her walker at home,uuing her wheel chair when she needs to cook. Denies falls.  Voltaren gel also recommended but not covered by her insurance.  She is planning on starting PT at home tomorrow.    -Atrial fib, s/p bioprosthetic aorta valve replacement. She is currently on Coumadin, which was adjusted recently, INR 2.0. She is asking is she can eat "greens."  She has appt with cardiologists 06/2016.  Lab Results  Component Value Date   WBC 6.1 04/08/2016   HGB 8.5 (L) 12/13/2015   HCT 37.9 04/08/2016   MCV 92 04/08/2016   PLT 136 (L) 04/08/2016     Osteoporosis: last OV she  requested Rx for a different medication to treat osteoporosis. Fosamax was d/c because it was causing cough,she requested a different oral medication.She called a few days later requesting Fosamax Rx because Actonel was not covered by her health insurance. She has not picked up Rx,reporting that cough has improved and was mainly attributed to Lisinopril.  She has appt with endocrinologists already arranged for hyperthyroidism.    HTN: BP was elevated last OV. BP readings at home: "Good"  She is currently on Losartan 25 mg daily and Coreg 12.5 mg bid.  Denies severe/frequent  headache, visual changes, chest pain, dyspnea, palpitation, claudication, focal weakness, or edema.  She tells me that she checks BP,pulse O2,and HR daily and they all "good."   Review of Systems  Constitutional: Positive for fatigue. Negative for appetite change, fever and unexpected weight change.  HENT: Negative for mouth sores, nosebleeds and trouble swallowing.   Eyes: Negative for redness and visual disturbance.  Respiratory: Negative for cough, shortness of breath and wheezing.   Cardiovascular: Negative for chest pain and leg swelling.  Gastrointestinal: Negative for abdominal pain, nausea and vomiting.       Negative for changes in bowel habits.  Genitourinary: Negative for decreased urine volume and hematuria.  Musculoskeletal: Positive for arthralgias, back pain and gait problem.  Skin: Negative for rash.  Neurological: Negative for syncope, weakness and headaches.  Psychiatric/Behavioral: Negative for confusion. The patient is nervous/anxious.       Current Outpatient Prescriptions on File Prior to Visit  Medication Sig Dispense Refill  . albuterol (PROAIR HFA) 108 (90 Base) MCG/ACT inhaler Inhale 2 puffs into the lungs every 6 (six) hours as needed for wheezing or shortness of breath.     Marland Kitchen alendronate (FOSAMAX) 70 MG tablet Take 1 tablet (70 mg total) by mouth once a week. with a full glass of water on an empty stomach. 13 tablet 2  . atorvastatin (LIPITOR) 40 MG tablet Take 40 mg by mouth at bedtime.    . budesonide-formoterol (SYMBICORT) 160-4.5 MCG/ACT inhaler Inhale 2 puffs into the lungs 2 (two) times daily. 1 Inhaler 0  . carvedilol (COREG) 12.5  MG tablet Take 1 tablet (12.5 mg total) by mouth 2 (two) times daily. 180 tablet 1  . Cholecalciferol (VITAMIN D3) 400 units tablet Take 1 tablet (400 Units total) by mouth daily. 30 tablet 0  . losartan (COZAAR) 25 MG tablet Take 1 tablet (25 mg total) by mouth daily. 90 tablet 1  . methimazole (TAPAZOLE) 5 MG tablet Take  15 mg by mouth every evening.    . senna-docusate (SENOKOT-S) 8.6-50 MG tablet Take 1 tablet by mouth 2 (two) times daily.     Marland Kitchen warfarin (COUMADIN) 5 MG tablet Take 5 mg on Sun, Mon, Tues, Wed, and Thurs. Take 7.5 mg on Saturdays. 90 tablet 0   No current facility-administered medications on file prior to visit.      Past Medical History:  Diagnosis Date  . Aortic stenosis    Status post bioprosthetic AVR  . Arthritis    DJD  . Asthma   . Chronic systolic CHF (congestive heart failure) (HCC)    EF 30% 04/2014  . Coronary artery disease   . Gastritis   . Hyperlipidemia   . Hypertension   . Hyperthyroidism   . Multiple thyroid nodules   . Pelvis fracture ([place]) 08/19/2015   MULTIPLE   . Spontaneous pneumothorax 11/28/2015   left   . Stroke [facility])    Allergies  Allergen Reactions  . Lisinopril Cough  . Tetanus Toxoid Adsorbed Swelling    Unknown     Social History   Social History  . Marital status: Married    Spouse name: Sherwood  . Number of children: 0  . Years of education: N/A   Occupational History  . Retired in 2004    Social History Main Topics  . Smoking status: Former Smoker    Packs/day: 0.25    Years: 10.00    Types: Cigarettes    Quit date: 03/01/2010  . Smokeless tobacco: Never Used  . Alcohol use No  . Drug use: No  . Sexual activity: Not Asked   Other Topics Concern  . None   Social History Narrative   Lives with husband.  Ambulated independently.    Vitals:   05/20/16 1519  BP: 138/90  Pulse: 87  Resp: 12   Body mass index is 34.3 kg/m.   Physical Exam  Nursing note and vitals reviewed. Constitutional: She is oriented to person, place, and time. She appears well-developed. No distress.  HENT:  Head: Atraumatic.  Mouth/Throat: Oropharynx is clear and moist and mucous membranes are normal.  Eyes: Conjunctivae and EOM are normal.  Cardiovascular: Normal rate and regular rhythm.   Murmur (? soft murmur RUSB) heard. Pulses:       Dorsalis pedis pulses are 2+ on the right side, and 2+ on the left side.  Respiratory: Effort normal and breath sounds normal. No respiratory distress.  GI: Soft. She exhibits no mass. There is no tenderness.  Musculoskeletal: She exhibits no edema.  Active ROM of hips is better today, still limited,right side elicits mild pain.  Neurological: She is alert and oriented to person, place, and time. She has normal strength. Coordination normal.  Wheel chair.  Skin: Skin is warm. No erythema.  Psychiatric: Her mood appears anxious.  Well groomed, good eye contact.      ASSESSMENT AND PLAN:    Thersea was seen today for follow-up.  Diagnoses and all orders for this visit:  Generalized osteoarthritis of multiple sites  In general Hydrocodone-Acetaminophen helps with pain,she can increase it  to 1 tab tid as needed but no more than 70 tabs/month.  Cymbalta 30 mg added.       We dicussed some side effects of both medications. Fall precautions. PT to start tomorrow. F/U in 2 months.  -     HYDROcodone-acetaminophen (NORCO/VICODIN) 5-325 MG tablet; Take 1 tablet by mouth every 8 (eight) hours as needed for moderate pain. -     DULoxetine (CYMBALTA) 30 MG capsule; Take 1 capsule (30 mg total) by mouth daily.  Essential hypertension  DBP mildly elevated,reporting BP's at home < 140/90. No changes in current management. Continue monitoring at home. F/U in 6 months, before if needed.  Osteoporosis, unspecified osteoporosis type, unspecified pathological fracture presence  Fosamax side effects discussed. Fall precautions. Continue Ca++ and vit D supplementation.  Chronic anticoagulation  No changes in Coumadin dose. She can eat green vegetables but she needs to be consistent with mount. Will adjust dose of Coumadin as needed after INR,which will be done next week at home through [facility].  Need for 23-polyvalent pneumococcal polysaccharide vaccine -     Pneumococcal  polysaccharide vaccine 23-valent greater than or equal to 2yo subcutaneous/IM  Other orders  Refill requested: -     furosemide (LASIX) 20 MG tablet; Take 1 tablet (20 mg total) by mouth daily as needed for fluid or edema. -     ranitidine (ZANTAC) 150 MG tablet; Take 1 tablet (150 mg total) by mouth 2 (two) times daily. -     Hep C Antibody     [name], MD  [facility] office.

## 2016-05-20 ENCOUNTER — Encounter: Payer: Self-pay | Admitting: Family Medicine

## 2016-05-20 ENCOUNTER — Ambulatory Visit (INDEPENDENT_AMBULATORY_CARE_PROVIDER_SITE_OTHER): Payer: Medicare Other | Admitting: Family Medicine

## 2016-05-20 VITALS — BP 138/90 | HR 87 | Resp 12 | Ht 67.0 in | Wt 219.0 lb

## 2016-05-20 DIAGNOSIS — M159 Polyosteoarthritis, unspecified: Secondary | ICD-10-CM | POA: Diagnosis not present

## 2016-05-20 DIAGNOSIS — Z23 Encounter for immunization: Secondary | ICD-10-CM | POA: Diagnosis not present

## 2016-05-20 DIAGNOSIS — Z7901 Long term (current) use of anticoagulants: Secondary | ICD-10-CM | POA: Diagnosis not present

## 2016-05-20 DIAGNOSIS — M810 Age-related osteoporosis without current pathological fracture: Secondary | ICD-10-CM

## 2016-05-20 DIAGNOSIS — I10 Essential (primary) hypertension: Secondary | ICD-10-CM | POA: Diagnosis not present

## 2016-05-20 DIAGNOSIS — Z1159 Encounter for screening for other viral diseases: Secondary | ICD-10-CM

## 2016-05-20 MED ORDER — HYDROCODONE-ACETAMINOPHEN 5-325 MG PO TABS: 1.0000 | ORAL_TABLET | Freq: Three times a day (TID) | ORAL | 0 refills | Status: DC | PRN

## 2016-05-20 MED ORDER — RANITIDINE HCL 150 MG PO TABS: 150.0000 mg | ORAL_TABLET | Freq: Two times a day (BID) | ORAL | 3 refills | Status: DC

## 2016-05-20 MED ORDER — DULOXETINE HCL 30 MG PO CPEP: 30.0000 mg | ORAL_CAPSULE | Freq: Every day | ORAL | 1 refills | Status: DC

## 2016-05-20 MED ORDER — FUROSEMIDE 20 MG PO TABS: 20.0000 mg | ORAL_TABLET | Freq: Every day | ORAL | 1 refills | Status: DC | PRN

## 2016-05-20 NOTE — Patient Instructions (Addendum)
A few things to remember from today's visit:   Essential hypertension  Generalized osteoarthritis of multiple sites - Plan: HYDROcodone-acetaminophen (NORCO/VICODIN) 5-325 MG tablet  Osteoporosis, unspecified osteoporosis type, unspecified pathological fracture presence  Chronic anticoagulation   Please be sure medication list is accurate. If a new problem present, please set up appointment sooner than planned today.   Danielle [name] , Thank you for taking time to come for your Medicare Wellness Visit. I appreciate your ongoing commitment to your health goals. Please review the following plan we discussed and let me know if I can assist you in the future.   The Centers for Disease Control are now recommending 2 pneumonia vaccinations after 62. The first is the Prevnar 13. This helps to boost your immunity to community acquired pneumonia as well as some protection from bacterial pneumonia  The 2nd is the pneumovax 23, which offers more broad protection!  Please consider taking these as this is your best protection against pneumonia.  Per your report you had the Prevnar 23 in 2106; need the 2nd pneumonia vaccine PSV 23 which will be your last pneumonia vaccine  Will have hepatis C at next blood draw   Will be due June/July 2018   Colonoscopy; please try to get your last report. If not, you can discuss fup with Dr. [name] May decide to have the cologuard  Therefore, Medicare Part B will cover the CologuardTM test once every three years for beneficiaries who meet all of the following criteria:  Age 85 to 66 years,  Asymptomatic (no signs or symptoms of colorectal disease including but not limited to lower gastrointestinal pain, blood in stool, positive guaiac fecal occult blood test or fecal immunochemical test), and  At average risk of developing colorectal cancer (no personal history of adenomatous polyps, colorectal cancer, or inflammatory bowel disease, including Crohn's  Disease and ulcerative colitis; no family history of colorectal cancers or adenomatous polyps, familial adenomatous polyposis, or hereditary nonpolyposis colorectal cancer).    We may want to repeat your bone density We need to get a bone density since your fx  Medicare require bone density within 6 months of your fx.  Need to complete prior to 4/10 th Danielle [name] will order    These are the goals we discussed: Goals    . patient          Will be independent again        This is a list of the screening recommended for you and due dates:  Health Maintenance  Topic Date Due  .  Hepatitis C: One time screening is recommended by Center for Disease Control  (CDC) for  adults born from [birth year] through 1965.   03-11-1945  . Tetanus Vaccine  01/13/1965  . Mammogram  01/14/1996  . Colon Cancer Screening  01/14/1996  . DEXA scan (bone density measurement)  01/14/2011  . Pneumonia vaccines (2 of 2 - PCV13) 11/21/2015  . Flu Shot  Completed   Prevention of falls: Remove rugs or any tripping hazards in the home Use Non slip mats in bathtubs and showers Placing grab bars next to the toilet and or shower Placing handrails on both sides of the stair way Adding extra lighting in the home.   Personal safety issues reviewed:  1. Consider starting a community watch program per [facility] 2.  Changes batteries is smoke detector and/or carbon monoxide detector  3.  If you have firearms; keep them in a safe place 4.  Wear protection  when in the sun; Always wear sunscreen or a hat; It is good to have your doctor check your skin annually or review any new areas of concern 5. Driving safety; Keep in the right lane; stay 3 car lengths behind the car in front of you on the highway; look 3 times prior to pulling out; carry your cell phone everywhere you go!    Learn about the Yellow Dot program:  The program allows first responders at your emergency to have access to who your physician is, as  well as your medications and medical conditions.  Citizens requesting the Yellow Dot Packages should contact Master Corporal Nunzio Cobbs at the [facility] [phone] for the first week of the program and beginning the week after Easter citizens should contact their Scientist, physiological.     Health Maintenance for Postmenopausal Women Menopause is a normal process in which your reproductive ability comes to an end. This process happens gradually over a span of months to years, usually between the ages of 34 and 54. Menopause is complete when you have missed 12 consecutive menstrual periods. It is important to talk with your health care provider about some of the most common conditions that affect postmenopausal women, such as heart disease, cancer, and bone loss (osteoporosis). Adopting a healthy lifestyle and getting preventive care can help to promote your health and wellness. Those actions can also lower your chances of developing some of these common conditions. What should I know about menopause? During menopause, you may experience a number of symptoms, such as:  Moderate-to-severe hot flashes.  Night sweats.  Decrease in sex drive.  Mood swings.  Headaches.  Tiredness.  Irritability.  Memory problems.  Insomnia. Choosing to treat or not to treat menopausal changes is an individual decision that you make with your health care provider. What should I know about hormone replacement therapy and supplements? Hormone therapy products are effective for treating symptoms that are associated with menopause, such as hot flashes and night sweats. Hormone replacement carries certain risks, especially as you become older. If you are thinking about using estrogen or estrogen with progestin treatments, discuss the benefits and risks with your health care provider. What should I know about heart disease and stroke? Heart disease, heart attack, and stroke  become more likely as you age. This may be due, in part, to the hormonal changes that your body experiences during menopause. These can affect how your body processes dietary fats, triglycerides, and cholesterol. Heart attack and stroke are both medical emergencies. There are many things that you can do to help prevent heart disease and stroke:  Have your blood pressure checked at least every 1-2 years. High blood pressure causes heart disease and increases the risk of stroke.  If you are 27-18 years old, ask your health care provider if you should take aspirin to prevent a heart attack or a stroke.  Do not use any tobacco products, including cigarettes, chewing tobacco, or electronic cigarettes. If you need help quitting, ask your health care provider.  It is important to eat a healthy diet and maintain a healthy weight.  Be sure to include plenty of vegetables, fruits, low-fat dairy products, and lean protein.  Avoid eating foods that are high in solid fats, added sugars, or salt (sodium).  Get regular exercise. This is one of the most important things that you can do for your health.  Try to exercise for at least 150 minutes each week. The type  of exercise that you do should increase your heart rate and make you sweat. This is known as moderate-intensity exercise.  Try to do strengthening exercises at least twice each week. Do these in addition to the moderate-intensity exercise.  Know your numbers.Ask your health care provider to check your cholesterol and your blood glucose. Continue to have your blood tested as directed by your health care provider. What should I know about cancer screening? There are several types of cancer. Take the following steps to reduce your risk and to catch any cancer development as early as possible. Breast Cancer  Practice breast self-awareness.  This means understanding how your breasts normally appear and feel.  It also means doing regular breast  self-exams. Let your health care provider know about any changes, no matter how small.  If you are 18 or older, have a clinician do a breast exam (clinical breast exam or CBE) every year. Depending on your age, family history, and medical history, it may be recommended that you also have a yearly breast X-ray (mammogram).  If you have a family history of breast cancer, talk with your health care provider about genetic screening.  If you are at high risk for breast cancer, talk with your health care provider about having an MRI and a mammogram every year.  Breast cancer (BRCA) gene test is recommended for women who have family members with BRCA-related cancers. Results of the assessment will determine the need for genetic counseling and BRCA1 and for BRCA2 testing. BRCA-related cancers include these types:  Breast. This occurs in males or females.  Ovarian.  Tubal. This may also be called fallopian tube cancer.  Cancer of the abdominal or pelvic lining (peritoneal cancer).  Prostate.  Pancreatic. Cervical, Uterine, and Ovarian Cancer  Your health care provider may recommend that you be screened regularly for cancer of the pelvic organs. These include your ovaries, uterus, and vagina. This screening involves a pelvic exam, which includes checking for microscopic changes to the surface of your cervix (Pap test).  For women ages 21-65, health care providers may recommend a pelvic exam and a Pap test every three years. For women ages 68-65, they may recommend the Pap test and pelvic exam, combined with testing for human papilloma virus (HPV), every five years. Some types of HPV increase your risk of cervical cancer. Testing for HPV may also be done on women of any age who have unclear Pap test results.  Other health care providers may not recommend any screening for nonpregnant women who are considered low risk for pelvic cancer and have no symptoms. Ask your health care provider if a screening  pelvic exam is right for you.  If you have had past treatment for cervical cancer or a condition that could lead to cancer, you need Pap tests and screening for cancer for at least 20 years after your treatment. If Pap tests have been discontinued for you, your risk factors (such as having a new sexual partner) need to be reassessed to determine if you should start having screenings again. Some women have medical problems that increase the chance of getting cervical cancer. In these cases, your health care provider may recommend that you have screening and Pap tests more often.  If you have a family history of uterine cancer or ovarian cancer, talk with your health care provider about genetic screening.  If you have vaginal bleeding after reaching menopause, tell your health care provider.  There are currently no reliable tests available  to screen for ovarian cancer. Lung Cancer  Lung cancer screening is recommended for adults 52-52 years old who are at high risk for lung cancer because of a history of smoking. A yearly low-dose CT scan of the lungs is recommended if you:  Currently smoke.  Have a history of at least 30 pack-years of smoking and you currently smoke or have quit within the past 15 years. A pack-year is smoking an average of one pack of cigarettes per day for one year. Yearly screening should:  Continue until it has been 15 years since you quit.  Stop if you develop a health problem that would prevent you from having lung cancer treatment. Colorectal Cancer  This type of cancer can be detected and can often be prevented.  Routine colorectal cancer screening usually begins at age 36 and continues through age 27.  If you have risk factors for colon cancer, your health care provider may recommend that you be screened at an earlier age.  If you have a family history of colorectal cancer, talk with your health care provider about genetic screening.  Your health care provider  may also recommend using home test kits to check for hidden blood in your stool.  A small camera at the end of a tube can be used to examine your colon directly (sigmoidoscopy or colonoscopy). This is done to check for the earliest forms of colorectal cancer.  Direct examination of the colon should be repeated every 5-10 years until age 54. However, if early forms of precancerous polyps or small growths are found or if you have a family history or genetic risk for colorectal cancer, you may need to be screened more often. Skin Cancer  Check your skin from head to toe regularly.  Monitor any moles. Be sure to tell your health care provider:  About any new moles or changes in moles, especially if there is a change in a mole's shape or color.  If you have a mole that is larger than the size of a pencil eraser.  If any of your family members has a history of skin cancer, especially at a young age, talk with your health care provider about genetic screening.  Always use sunscreen. Apply sunscreen liberally and repeatedly throughout the day.  Whenever you are outside, protect yourself by wearing long sleeves, pants, a wide-brimmed hat, and sunglasses. What should I know about osteoporosis? Osteoporosis is a condition in which bone destruction happens more quickly than new bone creation. After menopause, you may be at an increased risk for osteoporosis. To help prevent osteoporosis or the bone fractures that can happen because of osteoporosis, the following is recommended:  If you are 6-55 years old, get at least 1,000 mg of calcium and at least 600 mg of vitamin D per day.  If you are older than age 67 but younger than age 49, get at least 1,200 mg of calcium and at least 600 mg of vitamin D per day.  If you are older than age 62, get at least 1,200 mg of calcium and at least 800 mg of vitamin D per day. Smoking and excessive alcohol intake increase the risk of osteoporosis. Eat foods that are  rich in calcium and vitamin D, and do weight-bearing exercises several times each week as directed by your health care provider. What should I know about how menopause affects my mental health? Depression may occur at any age, but it is more common as you become older. Common symptoms  of depression include:  Low or sad mood.  Changes in sleep patterns.  Changes in appetite or eating patterns.  Feeling an overall lack of motivation or enjoyment of activities that you previously enjoyed.  Frequent crying spells. Talk with your health care provider if you think that you are experiencing depression. What should I know about immunizations? It is important that you get and maintain your immunizations. These include:  Tetanus, diphtheria, and pertussis (Tdap) booster vaccine.  Influenza every year before the flu season begins.  Pneumonia vaccine.  Shingles vaccine. Your health care provider may also recommend other immunizations. This information is not intended to replace advice given to you by your health care provider. Make sure you discuss any questions you have with your health care provider. Document Released: 04/09/2005 Document Revised: 09/05/2015 Document Reviewed: 11/19/2014 Elsevier Interactive Patient Education  2017 [employer] Prevention in the Home Falls can cause injuries and can affect people from all age groups. There are many simple things that you can do to make your home safe and to help prevent falls. What can I do on the outside of my home?  Regularly repair the edges of walkways and driveways and fix any cracks.  Remove high doorway thresholds.  Trim any shrubbery on the main path into your home.  Use bright outdoor lighting.  Clear walkways of debris and clutter, including tools and rocks.  Regularly check that handrails are securely fastened and in good repair. Both sides of any steps should have handrails.  Install guardrails along the edges  of any raised decks or porches.  Have leaves, snow, and ice cleared regularly.  Use sand or salt on walkways during winter months.  In the garage, clean up any spills right away, including grease or oil spills. What can I do in the bathroom?  Use night lights.  Install grab bars by the toilet and in the tub and shower. Do not use towel bars as grab bars.  Use non-skid mats or decals on the floor of the tub or shower.  If you need to sit down while you are in the shower, use a plastic, non-slip stool.  Keep the floor dry. Immediately clean up any water that spills on the floor.  Remove soap buildup in the tub or shower on a regular basis.  Attach bath mats securely with double-sided non-slip rug tape.  Remove throw rugs and other tripping hazards from the floor. What can I do in the bedroom?  Use night lights.  Make sure that a bedside light is easy to reach.  Do not use oversized bedding that drapes onto the floor.  Have a firm chair that has side arms to use for getting dressed.  Remove throw rugs and other tripping hazards from the floor. What can I do in the kitchen?  Clean up any spills right away.  Avoid walking on wet floors.  Place frequently used items in easy-to-reach places.  If you need to reach for something above you, use a sturdy step stool that has a grab bar.  Keep electrical cables out of the way.  Do not use floor polish or wax that makes floors slippery. If you have to use wax, make sure that it is non-skid floor wax.  Remove throw rugs and other tripping hazards from the floor. What can I do in the stairways?  Do not leave any items on the stairs.  Make sure that there are handrails on both sides of the  stairs. Fix handrails that are broken or loose. Make sure that handrails are as long as the stairways.  Check any carpeting to make sure that it is firmly attached to the stairs. Fix any carpet that is loose or worn.  Avoid having throw  rugs at the top or bottom of stairways, or secure the rugs with carpet tape to prevent them from moving.  Make sure that you have a light switch at the top of the stairs and the bottom of the stairs. If you do not have them, have them installed. What are some other fall prevention tips?  Wear closed-toe shoes that fit well and support your feet. Wear shoes that have rubber soles or low heels.  When you use a stepladder, make sure that it is completely opened and that the sides are firmly locked. Have someone hold the ladder while you are using it. Do not climb a closed stepladder.  Add color or contrast paint or tape to grab bars and handrails in your home. Place contrasting color strips on the first and last steps.  Use mobility aids as needed, such as canes, walkers, scooters, and crutches.  Turn on lights if it is dark. Replace any light bulbs that burn out.  Set up furniture so that there are clear paths. Keep the furniture in the same spot.  Fix any uneven floor surfaces.  Choose a carpet design that does not hide the edge of steps of a stairway.  Be aware of any and all pets.  Review your medicines with your healthcare provider. Some medicines can cause dizziness or changes in blood pressure, which increase your risk of falling. Talk with your health care provider about other ways that you can decrease your risk of falls. This may include working with a physical therapist or trainer to improve your strength, balance, and endurance. This information is not intended to replace advice given to you by your health care provider. Make sure you discuss any questions you have with your health care provider. Document Released: 02/05/2002 Document Revised: 07/15/2015 Document Reviewed: 03/22/2014 Elsevier Interactive Patient Education  2017 [employer].

## 2016-05-20 NOTE — Progress Notes (Signed)
Pre visit review using our clinic review tool, if applicable. No additional management support is needed unless otherwise documented below in the visit note. 

## 2016-05-20 NOTE — Progress Notes (Signed)
Subjective:   Danielle [name] is a 71 y.o. female who presents for Medicare Annual (Subsequent) preventive examination.  HRA assessment completed during this visit with Danielle [name] The Patient was informed that the wellness visit is to identify future health risk and educate and initiate measures that can reduce risk for increased disease through the lifespan.    NO ROS; Medicare Wellness Visit Aortic valve replacement in [place] 03/2014 Hx; 08/19/15 of ER admit w multiple closed fx pelvis CHF with EF 30%  9/29 had pneumothorax 10/11 closed fx of left hip due to tripping on comforter Describes health as good, fair or great Presented on 3/6 to Dr. [name] as a new patient PT ordered for home;   Osteoporosis; referred to endocrinologist  Right leg is a little better; Oct 2017  PT start date is tomorrow She had anti-inflammatory but pharmacist would not fill this due to blood thinner   MEDS Needs alendronate ordered; furosemide ordered; hydrocodone ordered Zantac ordered Coumadin; was told to eat green veg 3 days a week consistently   Support no stairs Has spouse works Psychiatric nurse to 2;30 in the evenings Takes a shower if spouse is there     Family hx; Brother HIV; father cancer; mother had a stroke  Meds  Primary Prevention Tobacco Former smoker; .05 x 20 years with 10 pack year hx ETOH no   Diet  Cereal for breakfast Does not always eat lunch Supper; salad and chicken; lima beans Vegetables; cole slaw; or other  BMI 34 Eating ok  But gained from 199 before all of  Her medical issues   Exercise/ PT in the home  Told her to walk from bedroom to the kitchen tid And do exercises in bed; Pt is coming tomorrow (normal to her prior was limited )   Dental; no issues   Stressors yes getting well   Safety Fall hx; see note above with hx Recovering from hip fx on the right Fear of falling very careful Given education on "Fall Prevention in the Home" for  more safety tips the patient can apply as appropriate.   Given information on Community safety; driving safety, sun protection, firearm safety, smoke detectors as well as the "yellow dot" program for residents in [facility].   Hearing Screening   125Hz  250Hz  500Hz  1000Hz  2000Hz  3000Hz  4000Hz  6000Hz  8000Hz   Right ear:       100    Left ear:       100    Vision Screening Comments: Eye exams once a year Had one last year My eye doctor; spouse has been    Do you have little interest or pleasure in doing things? no Have you been feeling down, depressed, hopeless? no  PHQ9 waived or completed    Ad8 score reviewed for issues;  Issues making decisions; no  Less interest in hobbies / activities" no  Repeats questions, stories; family complaining: NO  Trouble using ordinary gadgets; microwave; computer: no  Forgets the month or year: no  Mismanaging finances: no  Missing apt: no but does write them down  Daily problems with thinking of memory NO Ad8 score is 0  MMSE not appropriate unless AD8 score is > 2   Advanced Directive completed  Health Maintenance Due  Topic Date Due  . Hepatitis C Screening  09/15/1945  . TETANUS/TDAP  01/13/1965  . COLONOSCOPY  01/14/1996  . DEXA SCAN  01/14/2011   Will order Hep C for future draw  Immunizations/ 65 on 11/15/  2012 Allergic to tetanus toxoid  Was seen by Dr. Ena Dawley She was to obtain her colonoscopy records  States she had Prevnar 13 at [employer] [facility] and flu vaccine the same time/(Pneumovax documented as due 01/14/2011 - summary from [facility] does not list IMM) Had shingles the year prior Will give her PSV 23 today   Colonoscopy: states she had one and requested she retrieve the report  Mammogram due 07/08/2016 per [facility] at [facility]  Dexa no report but states she has osteoporosis  Was ordered Actonel and the insurance would not cover;  Now ordered fosamax per Dr. [name]; at the pharmacy   Assessment:    Appropriate referrals made or health recommendations as appropriate based on individual needs and choices;  To order mammogram for May or June 2018 DEXA osteoporosis; do not have dexa report   Patient Care Team: Danielle G [name], MD as PCP - General (Family Medicine)      Cardiac Risk Factors include: advanced age (>59men, >21 women);dyslipidemia;hypertension;obesity (BMI >30kg/m2);sedentary lifestyle     Objective:     Vitals: BP 138/90   Pulse 87   Resp 12   Ht 5\' 7"  (1.702 m)   Wt 219 lb (99.3 kg)   BMI 34.30 kg/m   Body mass index is 34.3 kg/m. Pulse ox taken and was adequate Neglected to enter; BP stable;  Managed to go to the bathroom without sob or fatigue   Tobacco History  Smoking Status  . Former Smoker  . Packs/day: 0.25  . Years: 10.00  . Types: Cigarettes  . Quit date: 03/01/2010  Smokeless Tobacco  . Never Used     Counseling given: Yes   Past Medical History:  Diagnosis Date  . Aortic stenosis    Status post bioprosthetic AVR  . Arthritis    DJD  . Asthma   . Chronic systolic CHF (congestive heart failure) (HCC)    EF 30% 04/2014  . Coronary artery disease   . Gastritis   . Hyperlipidemia   . Hypertension   . Hyperthyroidism   . Multiple thyroid nodules   . Pelvis fracture ([place]) 08/19/2015   MULTIPLE   . Spontaneous pneumothorax 11/28/2015   left   . Stroke [facility])    Past Surgical History:  Procedure Laterality Date  . CHEST TUBE INSERTION  10/2015  . CORONARY ARTERY BYPASS GRAFT  04/2014  . INTRAMEDULLARY (IM) NAIL INTERTROCHANTERIC Left 12/10/2015   Procedure: INTRAMEDULLARY (IM) NAIL INTERTROCHANTRIC;  Surgeon: Leandrew Koyanagi, MD;  Location: [place];  Service: Orthopedics;  Laterality: Left;  . PLEURADESIS Left 12/03/2015   Procedure: PLEURADESIS;  Surgeon: Melrose Nakayama, MD;  Location: [place];  Service: Thoracic;  Laterality: Left;  . RESECTION OF APICAL BLEB Left 12/03/2015   Procedure: BLEBECTOMY;  Surgeon: Melrose Nakayama,  MD;  Location: [place];  Service: Thoracic;  Laterality: Left;  . THORACOSCOPY  12/03/2015  . VALVE REPLACEMENT    . VIDEO ASSISTED THORACOSCOPY Left 12/03/2015   Procedure: VIDEO ASSISTED THORACOSCOPY;  Surgeon: Melrose Nakayama, MD;  Location[facility];  Service: Thoracic;  Laterality: Left;   Family History  Problem Relation Age of Onset  . Diabetes Mother   . Heart attack Mother 7  . Diabetes Father   . Lung cancer Father   . Diabetes Sister   . Diabetes Sister   . HIV Brother    History  Sexual Activity  . Sexual activity: Not on file    Outpatient Encounter Prescriptions as  of 05/20/2016  Medication Sig  . albuterol (PROAIR HFA) 108 (90 Base) MCG/ACT inhaler Inhale 2 puffs into the lungs every 6 (six) hours as needed for wheezing or shortness of breath.   Marland Kitchen alendronate (FOSAMAX) 70 MG tablet Take 1 tablet (70 mg total) by mouth once a week. with a full glass of water on an empty stomach.  Marland Kitchen atorvastatin (LIPITOR) 40 MG tablet Take 40 mg by mouth at bedtime.  . budesonide-formoterol (SYMBICORT) 160-4.5 MCG/ACT inhaler Inhale 2 puffs into the lungs 2 (two) times daily.  . carvedilol (COREG) 12.5 MG tablet Take 1 tablet (12.5 mg total) by mouth 2 (two) times daily.  . Cholecalciferol (VITAMIN D3) 400 units tablet Take 1 tablet (400 Units total) by mouth daily.  . furosemide (LASIX) 20 MG tablet Take 1 tablet (20 mg total) by mouth daily as needed for fluid or edema.  Marland Kitchen HYDROcodone-acetaminophen (NORCO/VICODIN) 5-325 MG tablet Take 1 tablet by mouth every 8 (eight) hours as needed for moderate pain.  Marland Kitchen losartan (COZAAR) 25 MG tablet Take 1 tablet (25 mg total) by mouth daily.  . methimazole (TAPAZOLE) 5 MG tablet Take 15 mg by mouth every evening.  . ranitidine (ZANTAC) 150 MG tablet Take 1 tablet (150 mg total) by mouth 2 (two) times daily.  Marland Kitchen senna-docusate (SENOKOT-S) 8.6-50 MG tablet Take 1 tablet by mouth 2 (two) times daily.   Marland Kitchen warfarin (COUMADIN) 5 MG tablet Take 5 mg on  Sun, Mon, Tues, Wed, and Thurs. Take 7.5 mg on Saturdays.  . [DISCONTINUED] diclofenac sodium (VOLTAREN) 1 % GEL Apply 4 g topically 4 (four) times daily.  . [DISCONTINUED] furosemide (LASIX) 20 MG tablet Take 20 mg by mouth daily as needed for fluid or edema.  . [DISCONTINUED] HYDROcodone-acetaminophen (NORCO/VICODIN) 5-325 MG tablet Take 1 tablet by mouth every 12 (twelve) hours as needed for moderate pain.  . [DISCONTINUED] ranitidine (ZANTAC) 150 MG tablet Take 150 mg by mouth 2 (two) times daily.  . DULoxetine (CYMBALTA) 30 MG capsule Take 1 capsule (30 mg total) by mouth daily.  . [DISCONTINUED] traMADol (ULTRAM) 50 MG tablet Take 50 mg by mouth every 6 (six) hours as needed.   No facility-administered encounter medications on file as of 05/20/2016.     Activities of Daily Living In your present state of health, do you have any difficulty performing the following activities: 05/20/2016 01/02/2016  Hearing? N Y  Vision? N N  Difficulty concentrating or making decisions? N N  Walking or climbing stairs? N N  Dressing or bathing? N Y  Doing errands, shopping? N Y  Conservation officer, nature and eating ? N N  Using the Toilet? N Y  In the past six months, have you accidently leaked urine? N Y  Do you have problems with loss of bowel control? N Y  Managing your Medications? N N  Managing your Finances? N N  Housekeeping or managing your Housekeeping? N Y  Some recent data might be hidden    Patient Care Team: Danielle G [name], MD as PCP - General (Family Medicine)    Assessment:     Exercise Activities and Dietary recommendations    Goals    . patient          Will be independent again       Fall Risk Fall Risk  05/20/2016 01/02/2016 12/23/2015  Falls in the past year? Yes Yes Yes  Number falls in past yr: - 2 or more 2 or more  Injury with Fall?  Yes Yes -  Risk Factor Category  High Fall Risk High Fall Risk High Fall Risk  Risk for fall due to : History of fall(s);Impaired  balance/gait;Impaired mobility History of fall(s);Impaired balance/gait;Impaired mobility;Impaired vision;Medication side effect History of fall(s);Impaired balance/gait;Impaired mobility  Follow up Falls evaluation completed Follow up appointment -   Depression Screen PHQ 2/9 Scores 05/20/2016 01/02/2016 12/23/2015  PHQ - 2 Score 0 0 0     Cognitive Function wnl  MMSE - Mini Mental State Exam 05/20/2016  Not completed: (No Data)        Immunization History  Administered Date(s) Administered  . Influenza-Unspecified 12/07/2015  . Pneumococcal Conjugate-13 11/21/2014  . Pneumococcal Polysaccharide-23 05/20/2016  . Zoster 03/01/2014   Screening Tests Health Maintenance  Topic Date Due  . Hepatitis C Screening  May 13, 1945  . TETANUS/TDAP  01/13/1965  . COLONOSCOPY  01/14/1996  . DEXA SCAN  01/14/2011  . MAMMOGRAM  08/28/2016 (Originally 01/14/1996)  . INFLUENZA VACCINE  Completed  . PNA vac Low Risk Adult  Completed      Plan:   All preventive health reviewed for a plan to complete Agreed to take her PSV 23 today  May consider cologuard as she is not sure when her colonoscopy is due. Will defer to the next apt.  Ordered hep c for future draw  Ordered dexa scan for her to try to schedule due to her fx in Oct but is under treatment.  PT to start 02/23   During the course of the visit the patient was educated and counseled about the following appropriate screening and preventive services:   Vaccines to include Pneumoccal, Influenza, Hepatitis B, Td, Zostavax, HCV  Electrocardiogram  Cardiovascular Disease  Colorectal cancer screening  Bone density screening  Diabetes screening  Glaucoma screening  Mammography/PAP  Nutrition counseling   Patient Instructions (the written plan) was given to the patient.   Danielle [name], MD  05/20/2016   I have reviewed documentation from this visit and I agree with recommendations given.  Danielle G. [name], MD  [facility]. [place] office.

## 2016-05-21 DIAGNOSIS — R339 Retention of urine, unspecified: Secondary | ICD-10-CM | POA: Diagnosis not present

## 2016-05-21 DIAGNOSIS — D509 Iron deficiency anemia, unspecified: Secondary | ICD-10-CM | POA: Diagnosis not present

## 2016-05-21 DIAGNOSIS — I480 Paroxysmal atrial fibrillation: Secondary | ICD-10-CM | POA: Diagnosis not present

## 2016-05-21 DIAGNOSIS — M25551 Pain in right hip: Secondary | ICD-10-CM | POA: Diagnosis not present

## 2016-05-21 DIAGNOSIS — I2510 Atherosclerotic heart disease of native coronary artery without angina pectoris: Secondary | ICD-10-CM | POA: Diagnosis not present

## 2016-05-21 DIAGNOSIS — M810 Age-related osteoporosis without current pathological fracture: Secondary | ICD-10-CM | POA: Diagnosis not present

## 2016-05-21 DIAGNOSIS — E039 Hypothyroidism, unspecified: Secondary | ICD-10-CM | POA: Diagnosis not present

## 2016-05-21 DIAGNOSIS — J449 Chronic obstructive pulmonary disease, unspecified: Secondary | ICD-10-CM | POA: Diagnosis not present

## 2016-05-21 DIAGNOSIS — Z7901 Long term (current) use of anticoagulants: Secondary | ICD-10-CM | POA: Diagnosis not present

## 2016-05-21 DIAGNOSIS — Z9181 History of falling: Secondary | ICD-10-CM | POA: Diagnosis not present

## 2016-05-21 DIAGNOSIS — I509 Heart failure, unspecified: Secondary | ICD-10-CM | POA: Diagnosis not present

## 2016-05-21 DIAGNOSIS — Z5181 Encounter for therapeutic drug level monitoring: Secondary | ICD-10-CM | POA: Diagnosis not present

## 2016-05-24 DIAGNOSIS — M25551 Pain in right hip: Secondary | ICD-10-CM | POA: Diagnosis not present

## 2016-05-24 DIAGNOSIS — Z5181 Encounter for therapeutic drug level monitoring: Secondary | ICD-10-CM | POA: Diagnosis not present

## 2016-05-24 DIAGNOSIS — I2510 Atherosclerotic heart disease of native coronary artery without angina pectoris: Secondary | ICD-10-CM | POA: Diagnosis not present

## 2016-05-24 DIAGNOSIS — I509 Heart failure, unspecified: Secondary | ICD-10-CM | POA: Diagnosis not present

## 2016-05-24 DIAGNOSIS — Z9181 History of falling: Secondary | ICD-10-CM | POA: Diagnosis not present

## 2016-05-24 DIAGNOSIS — I480 Paroxysmal atrial fibrillation: Secondary | ICD-10-CM | POA: Diagnosis not present

## 2016-05-24 DIAGNOSIS — E039 Hypothyroidism, unspecified: Secondary | ICD-10-CM | POA: Diagnosis not present

## 2016-05-24 DIAGNOSIS — R339 Retention of urine, unspecified: Secondary | ICD-10-CM | POA: Diagnosis not present

## 2016-05-24 DIAGNOSIS — J449 Chronic obstructive pulmonary disease, unspecified: Secondary | ICD-10-CM | POA: Diagnosis not present

## 2016-05-24 DIAGNOSIS — Z7901 Long term (current) use of anticoagulants: Secondary | ICD-10-CM | POA: Diagnosis not present

## 2016-05-24 DIAGNOSIS — D509 Iron deficiency anemia, unspecified: Secondary | ICD-10-CM | POA: Diagnosis not present

## 2016-05-24 DIAGNOSIS — M810 Age-related osteoporosis without current pathological fracture: Secondary | ICD-10-CM | POA: Diagnosis not present

## 2016-05-25 ENCOUNTER — Telehealth: Payer: Self-pay | Admitting: Family Medicine

## 2016-05-25 DIAGNOSIS — I509 Heart failure, unspecified: Secondary | ICD-10-CM | POA: Diagnosis not present

## 2016-05-25 NOTE — Telephone Encounter (Addendum)
Patient's new prescription Cymbalta showed up as a severe possible interaction with patient's Warfarin.  Danielle [name] doesn't require a call back, if anything needs to be adjusted you would just need to call the patient.

## 2016-05-25 NOTE — Telephone Encounter (Signed)
Please advise 

## 2016-05-26 ENCOUNTER — Telehealth: Payer: Self-pay | Admitting: Family Medicine

## 2016-05-26 LAB — PROTIME-INR: INR: 4.1 — AB (ref 0.9–1.1)

## 2016-05-26 NOTE — Telephone Encounter (Signed)
Colletta Maryland is calling to report patient PT 4.1 AND INR 39.7. PT is no longer being follow at coumadin clinic . [facility] has order to perform protime by dr [name]

## 2016-05-26 NOTE — Telephone Encounter (Signed)
A lot medications have the potential to interact with Coumadin. I still think she will be benefit from taking medication due to chronic back pain and OA. We need to follow INR closely and adjust Coumadin accordingly.  Thanks, BJ

## 2016-05-26 NOTE — Telephone Encounter (Signed)
Coumadin was increased by 2.5 mg/week dose about 10 days ago because INR was at 2.0. The only medication that was added last OV was Cymbalta. Hold on next dose, next day resume Coumadin 5 mg daily and recheck INR  Monday.  Thanks, BJ

## 2016-05-26 NOTE — Telephone Encounter (Signed)
Dr. [name] - PLEASE ADVISE. Thanks.

## 2016-05-27 ENCOUNTER — Encounter: Payer: Self-pay | Admitting: Endocrinology

## 2016-05-27 ENCOUNTER — Ambulatory Visit (INDEPENDENT_AMBULATORY_CARE_PROVIDER_SITE_OTHER): Payer: Medicare Other | Admitting: Endocrinology

## 2016-05-27 VITALS — BP 126/82 | HR 90 | Ht 67.0 in | Wt 217.0 lb

## 2016-05-27 DIAGNOSIS — E0590 Thyrotoxicosis, unspecified without thyrotoxic crisis or storm: Secondary | ICD-10-CM

## 2016-05-27 NOTE — Telephone Encounter (Signed)
Nothing needs to be changed at the moment, patient's INR will be rechecked on Monday.

## 2016-05-27 NOTE — Patient Instructions (Addendum)
Please continue the same medication for now. blood tests are requested for you today.  We'll let you know about the results. If ever you have fever while taking methimazole, stop it and call us, even if the reason is obvious, because of the risk of a rare side-effect.  Please come back for a follow-up appointment in 3-4 months.   If you want, you can stop the methimazole, and take the radioactive iodine pill: We would first check a thyroid "scan" (a special, but easy and painless type of thyroid x ray).  It works like this: you go to the x-ray department of the hospital to swallow a pill, which contains a miniscule amount of radiation.  You will not notice any symptoms from this.  You will go back to the x-ray department the next day, to lie down in front of a camera.  The results of this will be sent to me.   Based on the results, i hope to order for you a treatment pill of radioactive iodine.  Although it is a larger amount of radiation, you will again notice no symptoms from this.  The pill is gone from your body in a few days (during which you should stay away from other people), but takes several months to work.  Therefore, please return here approximately 6-8 weeks after the treatment.  This treatment has been available for many years, and the only known side-effect is an underactive thyroid.  It is possible that i would eventually prescribe for you a thyroid hormone pill, which is very inexpensive.  You don't have to worry about side-effects of this thyroid hormone pill, because it is the same molecule your thyroid makes.

## 2016-05-27 NOTE — Telephone Encounter (Signed)
Called and spoke with [place]. Advised her to have patient hold the next Coumadin dose and then go back to 5mg  daily and have the patient's INR rechecked on Monday. Colletta Maryland verbalized understanding.

## 2016-05-27 NOTE — Progress Notes (Signed)
Subjective:    Patient ID: Danielle [name], female    DOB: 22-Dec-1945, 71 y.o.   MRN: [id number]  HPI Pt is referred by Dr [name], for hyperthyroidism.  Pt reports she was dx'ed with hyperthyroidism in 2005.  she has been on tapazole since 2015.  she has never had XRT to the anterior neck, or thyroid surgery.  she had thyroid US, but it was in 2005, in [place].  she does not consume kelp or any other prescribed or non-prescribed thyroid medication.  she has never been on amiodarone.  She has slight tremor of the hands, but no assoc fever. She says hip pain is now too much for her to be isolated for RAI (husband helps her at home).   Past Medical History:  Diagnosis Date  . Aortic stenosis    Status post bioprosthetic AVR  . Arthritis    DJD  . Asthma   . Chronic systolic CHF (congestive heart failure) (HCC)    EF 30% 04/2014  . Coronary artery disease   . Gastritis   . Hyperlipidemia   . Hypertension   . Hyperthyroidism   . Multiple thyroid nodules   . Pelvis fracture ([place]) 08/19/2015   MULTIPLE   . Spontaneous pneumothorax 11/28/2015   left   . Stroke [facility])     Past Surgical History:  Procedure Laterality Date  . CHEST TUBE INSERTION  10/2015  . CORONARY ARTERY BYPASS GRAFT  04/2014  . INTRAMEDULLARY (IM) NAIL INTERTROCHANTERIC Left 12/10/2015   Procedure: INTRAMEDULLARY (IM) NAIL INTERTROCHANTRIC;  Surgeon: Leandrew Koyanagi, MD;  Location: [place];  Service: Orthopedics;  Laterality: Left;  . PLEURADESIS Left 12/03/2015   Procedure: PLEURADESIS;  Surgeon: Melrose Nakayama, MD;  Location: [place];  Service: Thoracic;  Laterality: Left;  . RESECTION OF APICAL BLEB Left 12/03/2015   Procedure: BLEBECTOMY;  Surgeon: Melrose Nakayama, MD;  Location: [place];  Service: Thoracic;  Laterality: Left;  . THORACOSCOPY  12/03/2015  . VALVE REPLACEMENT    . VIDEO ASSISTED THORACOSCOPY Left 12/03/2015   Procedure: VIDEO ASSISTED THORACOSCOPY;  Surgeon: Melrose Nakayama, MD;   Location: [place];  Service: Thoracic;  Laterality: Left;    Social History   Social History  . Marital status: Married    Spouse name: Sherwood  . Number of children: 0  . Years of education: N/A   Occupational History  . Retired in 2004    Social History Main Topics  . Smoking status: Former Smoker    Packs/day: 0.25    Years: 10.00    Types: Cigarettes    Quit date: 03/01/2010  . Smokeless tobacco: Never Used  . Alcohol use No  . Drug use: No  . Sexual activity: Not on file   Other Topics Concern  . Not on file   Social History Narrative   Lives with husband.  Ambulated independently.    Current Outpatient Prescriptions on File Prior to Visit  Medication Sig Dispense Refill  . albuterol (PROAIR HFA) 108 (90 Base) MCG/ACT inhaler Inhale 2 puffs into the lungs every 6 (six) hours as needed for wheezing or shortness of breath.     Marland Kitchen alendronate (FOSAMAX) 70 MG tablet Take 1 tablet (70 mg total) by mouth once a week. with a full glass of water on an empty stomach. 13 tablet 2  . atorvastatin (LIPITOR) 40 MG tablet Take 40 mg by mouth at bedtime.    . budesonide-formoterol (SYMBICORT) 160-4.5 MCG/ACT inhaler Inhale 2 puffs into  the lungs 2 (two) times daily. 1 Inhaler 0  . carvedilol (COREG) 12.5 MG tablet Take 1 tablet (12.5 mg total) by mouth 2 (two) times daily. 180 tablet 1  . Cholecalciferol (VITAMIN D3) 400 units tablet Take 1 tablet (400 Units total) by mouth daily. 30 tablet 0  . DULoxetine (CYMBALTA) 30 MG capsule Take 1 capsule (30 mg total) by mouth daily. 30 capsule 1  . furosemide (LASIX) 20 MG tablet Take 1 tablet (20 mg total) by mouth daily as needed for fluid or edema. 90 tablet 1  . HYDROcodone-acetaminophen (NORCO/VICODIN) 5-325 MG tablet Take 1 tablet by mouth every 8 (eight) hours as needed for moderate pain. 70 tablet 0  . losartan (COZAAR) 25 MG tablet Take 1 tablet (25 mg total) by mouth daily. 90 tablet 1  . methimazole (TAPAZOLE) 5 MG tablet Take 15 mg  by mouth every evening.    . ranitidine (ZANTAC) 150 MG tablet Take 1 tablet (150 mg total) by mouth 2 (two) times daily. 180 tablet 3  . senna-docusate (SENOKOT-S) 8.6-50 MG tablet Take 1 tablet by mouth 2 (two) times daily.     Marland Kitchen warfarin (COUMADIN) 5 MG tablet Take 5 mg on Sun, Mon, Tues, Wed, and Thurs. Take 7.5 mg on Saturdays. 90 tablet 0   No current facility-administered medications on file prior to visit.     Allergies  Allergen Reactions  . Lisinopril Cough  . Tetanus Toxoid Adsorbed Swelling    Unknown     Family History  Problem Relation Age of Onset  . Diabetes Mother   . Heart attack Mother 48  . Diabetes Father   . Lung cancer Father   . Diabetes Sister   . Thyroid disease Sister   . Diabetes Sister   . HIV Brother     BP 126/82   Pulse 90   Ht 5\' 7"  (1.702 m)   Wt 217 lb (98.4 kg)   SpO2 97%   BMI 33.99 kg/m    Review of Systems denies weight loss, headache, hoarseness, visual loss, palpitations, diarrhea, muscle weakness, excessive diaphoresis, numbness, anxiety, heat intolerance, easy bruising, and rhinorrhea.  She has doe and frequent urination.      Objective:   Physical Exam VS: see vs page GEN: no distress HEAD: head: no deformity eyes: no periorbital swelling, no proptosis.  external nose and ears are normal.  mouth: no lesion seen.  NECK: thyroid is approx 5 times normal size, diffuse.  CHEST WALL: no deformity.  Old healed surgical scar (median sternotomy).  LUNGS: clear to auscultation CV: reg rate and rhythm, no murmur.  ABD: abdomen is soft, nontender.  no hepatosplenomegaly.  not distended.  no hernia.  MUSCULOSKELETAL: muscle bulk and strength are grossly normal.  no obvious joint swelling.  gait is steady with a walker.  EXTEMITIES: no deformity.  Trace bilat leg edema PULSES: no carotid bruit NEURO:  cn 2-12 grossly intact.   readily moves all 4's.  sensation is intact to touch on all 4's SKIN:  Normal texture and temperature.   No rash or suspicious lesion is visible.   NODES:  None palpable at the neck PSYCH: alert, well-oriented.  Does not appear anxious nor depressed.  I have reviewed outside records, and summarized: Pt was noted to have hyperthyroidism, and referred here.  She was noted at [facility] in 2017 to possibly have multinodular goiter.  She had h/o AF and CHF.    Lab Results  Component Value Date  WBC 6.1 04/08/2016   HGB 8.5 (L) 12/13/2015   HCT 37.9 04/08/2016   MCV 92 04/08/2016   PLT 136 (L) 04/08/2016   Lab Results  Component Value Date   CREATININE 1.05 (H) 04/08/2016   BUN 18 04/08/2016   NA 140 04/08/2016   K 5.1 04/08/2016   CL 104 04/08/2016   CO2 23 04/08/2016      Assessment & Plan:  Hyperthyroidism, new to me, uncertain etiology.   AF, hip pain, and other med probs: she is not a good candidate for RAI, but she would like to consider.    Patient Instructions  Please continue the same medication for now. blood tests are requested for you today.  We'll let you know about the results. If ever you have fever while taking methimazole, stop it and call us, even if the reason is obvious, because of the risk of a rare side-effect.  Please come back for a follow-up appointment in 3-4 months.   If you want, you can stop the methimazole, and take the radioactive iodine pill: We would first check a thyroid "scan" (a special, but easy and painless type of thyroid x ray).  It works like this: you go to the x-ray department of the hospital to swallow a pill, which contains a miniscule amount of radiation.  You will not notice any symptoms from this.  You will go back to the x-ray department the next day, to lie down in front of a camera.  The results of this will be sent to me.   Based on the results, i hope to order for you a treatment pill of radioactive iodine.  Although it is a larger amount of radiation, you will again notice no symptoms from this.  The pill is gone from your body in a few  days (during which you should stay away from other people), but takes several months to work.  Therefore, please return here approximately 6-8 weeks after the treatment.  This treatment has been available for many years, and the only known side-effect is an underactive thyroid.  It is possible that i would eventually prescribe for you a thyroid hormone pill, which is very inexpensive.  You don't have to worry about side-effects of this thyroid hormone pill, because it is the same molecule your thyroid makes.

## 2016-05-28 DIAGNOSIS — Z9181 History of falling: Secondary | ICD-10-CM | POA: Diagnosis not present

## 2016-05-28 DIAGNOSIS — Z7901 Long term (current) use of anticoagulants: Secondary | ICD-10-CM | POA: Diagnosis not present

## 2016-05-28 DIAGNOSIS — M810 Age-related osteoporosis without current pathological fracture: Secondary | ICD-10-CM | POA: Diagnosis not present

## 2016-05-28 DIAGNOSIS — E039 Hypothyroidism, unspecified: Secondary | ICD-10-CM | POA: Diagnosis not present

## 2016-05-28 DIAGNOSIS — J449 Chronic obstructive pulmonary disease, unspecified: Secondary | ICD-10-CM | POA: Diagnosis not present

## 2016-05-28 DIAGNOSIS — I2510 Atherosclerotic heart disease of native coronary artery without angina pectoris: Secondary | ICD-10-CM | POA: Diagnosis not present

## 2016-05-28 DIAGNOSIS — Z5181 Encounter for therapeutic drug level monitoring: Secondary | ICD-10-CM | POA: Diagnosis not present

## 2016-05-28 DIAGNOSIS — I480 Paroxysmal atrial fibrillation: Secondary | ICD-10-CM | POA: Diagnosis not present

## 2016-05-28 DIAGNOSIS — D509 Iron deficiency anemia, unspecified: Secondary | ICD-10-CM | POA: Diagnosis not present

## 2016-05-28 DIAGNOSIS — M25551 Pain in right hip: Secondary | ICD-10-CM | POA: Diagnosis not present

## 2016-05-28 DIAGNOSIS — R339 Retention of urine, unspecified: Secondary | ICD-10-CM | POA: Diagnosis not present

## 2016-05-28 DIAGNOSIS — I509 Heart failure, unspecified: Secondary | ICD-10-CM | POA: Diagnosis not present

## 2016-05-31 ENCOUNTER — Encounter: Payer: Self-pay | Admitting: Family Medicine

## 2016-05-31 DIAGNOSIS — I509 Heart failure, unspecified: Secondary | ICD-10-CM | POA: Diagnosis not present

## 2016-05-31 DIAGNOSIS — E039 Hypothyroidism, unspecified: Secondary | ICD-10-CM | POA: Diagnosis not present

## 2016-05-31 DIAGNOSIS — D509 Iron deficiency anemia, unspecified: Secondary | ICD-10-CM | POA: Diagnosis not present

## 2016-05-31 DIAGNOSIS — R269 Unspecified abnormalities of gait and mobility: Secondary | ICD-10-CM | POA: Diagnosis not present

## 2016-05-31 DIAGNOSIS — J45909 Unspecified asthma, uncomplicated: Secondary | ICD-10-CM | POA: Diagnosis not present

## 2016-05-31 DIAGNOSIS — R339 Retention of urine, unspecified: Secondary | ICD-10-CM | POA: Diagnosis not present

## 2016-05-31 DIAGNOSIS — Z7901 Long term (current) use of anticoagulants: Secondary | ICD-10-CM | POA: Diagnosis not present

## 2016-05-31 DIAGNOSIS — I480 Paroxysmal atrial fibrillation: Secondary | ICD-10-CM | POA: Diagnosis not present

## 2016-05-31 DIAGNOSIS — M810 Age-related osteoporosis without current pathological fracture: Secondary | ICD-10-CM | POA: Diagnosis not present

## 2016-05-31 DIAGNOSIS — Z9181 History of falling: Secondary | ICD-10-CM | POA: Diagnosis not present

## 2016-05-31 DIAGNOSIS — I2510 Atherosclerotic heart disease of native coronary artery without angina pectoris: Secondary | ICD-10-CM | POA: Diagnosis not present

## 2016-05-31 DIAGNOSIS — M25551 Pain in right hip: Secondary | ICD-10-CM | POA: Diagnosis not present

## 2016-05-31 DIAGNOSIS — J449 Chronic obstructive pulmonary disease, unspecified: Secondary | ICD-10-CM | POA: Diagnosis not present

## 2016-05-31 DIAGNOSIS — Z5181 Encounter for therapeutic drug level monitoring: Secondary | ICD-10-CM | POA: Diagnosis not present

## 2016-05-31 LAB — PROTIME-INR

## 2016-06-01 DIAGNOSIS — I2510 Atherosclerotic heart disease of native coronary artery without angina pectoris: Secondary | ICD-10-CM | POA: Diagnosis not present

## 2016-06-01 DIAGNOSIS — J449 Chronic obstructive pulmonary disease, unspecified: Secondary | ICD-10-CM | POA: Diagnosis not present

## 2016-06-01 DIAGNOSIS — I480 Paroxysmal atrial fibrillation: Secondary | ICD-10-CM | POA: Diagnosis not present

## 2016-06-01 DIAGNOSIS — Z7901 Long term (current) use of anticoagulants: Secondary | ICD-10-CM | POA: Diagnosis not present

## 2016-06-01 DIAGNOSIS — M810 Age-related osteoporosis without current pathological fracture: Secondary | ICD-10-CM | POA: Diagnosis not present

## 2016-06-01 DIAGNOSIS — I509 Heart failure, unspecified: Secondary | ICD-10-CM | POA: Diagnosis not present

## 2016-06-01 DIAGNOSIS — R339 Retention of urine, unspecified: Secondary | ICD-10-CM | POA: Diagnosis not present

## 2016-06-01 DIAGNOSIS — Z9181 History of falling: Secondary | ICD-10-CM | POA: Diagnosis not present

## 2016-06-01 DIAGNOSIS — Z5181 Encounter for therapeutic drug level monitoring: Secondary | ICD-10-CM | POA: Diagnosis not present

## 2016-06-01 DIAGNOSIS — M25551 Pain in right hip: Secondary | ICD-10-CM | POA: Diagnosis not present

## 2016-06-01 DIAGNOSIS — E039 Hypothyroidism, unspecified: Secondary | ICD-10-CM | POA: Diagnosis not present

## 2016-06-01 DIAGNOSIS — D509 Iron deficiency anemia, unspecified: Secondary | ICD-10-CM | POA: Diagnosis not present

## 2016-06-01 NOTE — Telephone Encounter (Signed)
Received fax with INR at 3.5 and Prothrombin at 34.2. Per Dr. [name], patient should continue 5 mg of Coumadin daily and INR should be rechecked in 7 days. Called and spoke with Colletta Maryland and advised to have patient continue the 5 mg of Coumadin daily and then have her INR rechecked in 7 days. Colletta Maryland verbalized understanding.

## 2016-06-02 ENCOUNTER — Telehealth: Payer: Self-pay | Admitting: Family Medicine

## 2016-06-02 NOTE — Telephone Encounter (Signed)
° ° °  Pt said she was taking the below med before she started seeing Dr [name]  and is asking for a refill   atorvastatin (LIPITOR) 40 MG tablet   Pt said her insurance will pay for the below med  Diclofenac is a generic for  [employer];  Pyramid [employer]

## 2016-06-03 NOTE — Telephone Encounter (Signed)
Okay to refill? 

## 2016-06-04 ENCOUNTER — Other Ambulatory Visit: Payer: Self-pay | Admitting: Family Medicine

## 2016-06-04 DIAGNOSIS — I2510 Atherosclerotic heart disease of native coronary artery without angina pectoris: Secondary | ICD-10-CM | POA: Diagnosis not present

## 2016-06-04 DIAGNOSIS — I509 Heart failure, unspecified: Secondary | ICD-10-CM | POA: Diagnosis not present

## 2016-06-04 DIAGNOSIS — I480 Paroxysmal atrial fibrillation: Secondary | ICD-10-CM | POA: Diagnosis not present

## 2016-06-04 DIAGNOSIS — M25551 Pain in right hip: Secondary | ICD-10-CM | POA: Diagnosis not present

## 2016-06-04 DIAGNOSIS — J449 Chronic obstructive pulmonary disease, unspecified: Secondary | ICD-10-CM | POA: Diagnosis not present

## 2016-06-04 DIAGNOSIS — M810 Age-related osteoporosis without current pathological fracture: Secondary | ICD-10-CM | POA: Diagnosis not present

## 2016-06-04 DIAGNOSIS — E039 Hypothyroidism, unspecified: Secondary | ICD-10-CM | POA: Diagnosis not present

## 2016-06-04 DIAGNOSIS — Z5181 Encounter for therapeutic drug level monitoring: Secondary | ICD-10-CM | POA: Diagnosis not present

## 2016-06-04 DIAGNOSIS — Z7901 Long term (current) use of anticoagulants: Secondary | ICD-10-CM | POA: Diagnosis not present

## 2016-06-04 DIAGNOSIS — Z9181 History of falling: Secondary | ICD-10-CM | POA: Diagnosis not present

## 2016-06-04 DIAGNOSIS — R339 Retention of urine, unspecified: Secondary | ICD-10-CM | POA: Diagnosis not present

## 2016-06-04 DIAGNOSIS — D509 Iron deficiency anemia, unspecified: Secondary | ICD-10-CM | POA: Diagnosis not present

## 2016-06-04 MED ORDER — ATORVASTATIN CALCIUM 40 MG PO TABS: 40.0000 mg | ORAL_TABLET | Freq: Every day | ORAL | 2 refills | Status: DC

## 2016-06-04 NOTE — Telephone Encounter (Signed)
Rx sent. Thanks, BJ 

## 2016-06-07 ENCOUNTER — Telehealth: Payer: Self-pay | Admitting: Family Medicine

## 2016-06-07 ENCOUNTER — Other Ambulatory Visit (INDEPENDENT_AMBULATORY_CARE_PROVIDER_SITE_OTHER): Payer: Medicare Other

## 2016-06-07 ENCOUNTER — Other Ambulatory Visit: Payer: Self-pay

## 2016-06-07 DIAGNOSIS — E039 Hypothyroidism, unspecified: Secondary | ICD-10-CM | POA: Diagnosis not present

## 2016-06-07 DIAGNOSIS — Z9181 History of falling: Secondary | ICD-10-CM | POA: Diagnosis not present

## 2016-06-07 DIAGNOSIS — D509 Iron deficiency anemia, unspecified: Secondary | ICD-10-CM | POA: Diagnosis not present

## 2016-06-07 DIAGNOSIS — I480 Paroxysmal atrial fibrillation: Secondary | ICD-10-CM | POA: Diagnosis not present

## 2016-06-07 DIAGNOSIS — E0590 Thyrotoxicosis, unspecified without thyrotoxic crisis or storm: Secondary | ICD-10-CM

## 2016-06-07 DIAGNOSIS — Z5181 Encounter for therapeutic drug level monitoring: Secondary | ICD-10-CM | POA: Diagnosis not present

## 2016-06-07 DIAGNOSIS — J449 Chronic obstructive pulmonary disease, unspecified: Secondary | ICD-10-CM | POA: Diagnosis not present

## 2016-06-07 DIAGNOSIS — I509 Heart failure, unspecified: Secondary | ICD-10-CM | POA: Diagnosis not present

## 2016-06-07 DIAGNOSIS — M810 Age-related osteoporosis without current pathological fracture: Secondary | ICD-10-CM | POA: Diagnosis not present

## 2016-06-07 DIAGNOSIS — R339 Retention of urine, unspecified: Secondary | ICD-10-CM | POA: Diagnosis not present

## 2016-06-07 DIAGNOSIS — Z7901 Long term (current) use of anticoagulants: Secondary | ICD-10-CM | POA: Diagnosis not present

## 2016-06-07 DIAGNOSIS — I2510 Atherosclerotic heart disease of native coronary artery without angina pectoris: Secondary | ICD-10-CM | POA: Diagnosis not present

## 2016-06-07 DIAGNOSIS — M25551 Pain in right hip: Secondary | ICD-10-CM | POA: Diagnosis not present

## 2016-06-07 LAB — TSH: TSH: 2.61 u[IU]/mL (ref 0.35–4.50)

## 2016-06-07 LAB — T4, FREE: FREE T4: 0.72 ng/dL (ref 0.60–1.60)

## 2016-06-07 MED ORDER — DICLOFENAC SODIUM 1 % TD GEL: 4.0000 g | Freq: Four times a day (QID) | TRANSDERMAL | 3 refills | Status: DC

## 2016-06-07 NOTE — Telephone Encounter (Addendum)
We need to evaluate her diet, she was instructed to be cautious with green consumption in general,she needs to eat same amount daily or do not eat them at all.  Coumadin needs to be decrease, currently she is on 5 mg daily. Hold on Coumadin dose today and tomorrow. Then resume Coumadin 2.5 mg W andThurs and 5 mg Fri,Sat,Su,Mon,Tues.  She may need to start going to Coumadin clinic for education and close monitoring until INR is more stable. Monitor for signs of abnormal bleeding.  INR in 7 days.

## 2016-06-07 NOTE — Telephone Encounter (Signed)
Caren Griffins is calling to report patient  PROTIME 71.8 and INR 6.0. Please advice concerning coumadin dose

## 2016-06-07 NOTE — Telephone Encounter (Signed)
Called and spoke with Caren Griffins. Advised that Coumadin should be held for today and tomorrow. Then resume with 2.5 mg on Wednesday and Thursday. Then back at 5mg  for Friday-Tuesday. Advised to re-check her INR in 7 days and let us know. Caren Griffins said that patient has been only eating iceberg lettuce and kale as her greens. She also mentioned that the Cymbalta can cause the increase in the INR. Advised I'd pass the message along to you & she verbalized understanding.  Caren Griffins also mentioned sending in diclofenac gel for patient's hip pain. Okay to send to pharmacy?

## 2016-06-08 ENCOUNTER — Telehealth: Payer: Self-pay | Admitting: Family Medicine

## 2016-06-08 ENCOUNTER — Telehealth: Payer: Self-pay

## 2016-06-08 NOTE — Telephone Encounter (Signed)
I contacted the patient and advised thyroid labs from 06/07/2016 were all normal. Per Dr. Loanne Drilling, patient was advised to continue the same medication at this time and to follow up as scheduled. Patient voiced understanding.

## 2016-06-08 NOTE — Telephone Encounter (Signed)
It is Ok to change Voltaren gel for Diclofenac gel,same instructions.  Thanks, BJ

## 2016-06-08 NOTE — Telephone Encounter (Signed)
Rx sent 

## 2016-06-08 NOTE — Telephone Encounter (Signed)
Pt is asking for a call back please. She did not say why?

## 2016-06-08 NOTE — Telephone Encounter (Signed)
Patient missed call regarding lab results. She would like a call back to discuss.  Thank you,  -LL

## 2016-06-10 DIAGNOSIS — J449 Chronic obstructive pulmonary disease, unspecified: Secondary | ICD-10-CM | POA: Diagnosis not present

## 2016-06-10 DIAGNOSIS — Z9181 History of falling: Secondary | ICD-10-CM | POA: Diagnosis not present

## 2016-06-10 DIAGNOSIS — Z7901 Long term (current) use of anticoagulants: Secondary | ICD-10-CM | POA: Diagnosis not present

## 2016-06-10 DIAGNOSIS — D509 Iron deficiency anemia, unspecified: Secondary | ICD-10-CM | POA: Diagnosis not present

## 2016-06-10 DIAGNOSIS — R339 Retention of urine, unspecified: Secondary | ICD-10-CM | POA: Diagnosis not present

## 2016-06-10 DIAGNOSIS — I509 Heart failure, unspecified: Secondary | ICD-10-CM | POA: Diagnosis not present

## 2016-06-10 DIAGNOSIS — E039 Hypothyroidism, unspecified: Secondary | ICD-10-CM | POA: Diagnosis not present

## 2016-06-10 DIAGNOSIS — I2510 Atherosclerotic heart disease of native coronary artery without angina pectoris: Secondary | ICD-10-CM | POA: Diagnosis not present

## 2016-06-10 DIAGNOSIS — Z5181 Encounter for therapeutic drug level monitoring: Secondary | ICD-10-CM | POA: Diagnosis not present

## 2016-06-10 DIAGNOSIS — I480 Paroxysmal atrial fibrillation: Secondary | ICD-10-CM | POA: Diagnosis not present

## 2016-06-10 DIAGNOSIS — M25551 Pain in right hip: Secondary | ICD-10-CM | POA: Diagnosis not present

## 2016-06-10 DIAGNOSIS — M810 Age-related osteoporosis without current pathological fracture: Secondary | ICD-10-CM | POA: Diagnosis not present

## 2016-06-14 DIAGNOSIS — Z5181 Encounter for therapeutic drug level monitoring: Secondary | ICD-10-CM | POA: Diagnosis not present

## 2016-06-14 DIAGNOSIS — D509 Iron deficiency anemia, unspecified: Secondary | ICD-10-CM | POA: Diagnosis not present

## 2016-06-14 DIAGNOSIS — Z7901 Long term (current) use of anticoagulants: Secondary | ICD-10-CM | POA: Diagnosis not present

## 2016-06-14 DIAGNOSIS — R339 Retention of urine, unspecified: Secondary | ICD-10-CM | POA: Diagnosis not present

## 2016-06-14 DIAGNOSIS — I480 Paroxysmal atrial fibrillation: Secondary | ICD-10-CM | POA: Diagnosis not present

## 2016-06-14 DIAGNOSIS — M810 Age-related osteoporosis without current pathological fracture: Secondary | ICD-10-CM | POA: Diagnosis not present

## 2016-06-14 DIAGNOSIS — J449 Chronic obstructive pulmonary disease, unspecified: Secondary | ICD-10-CM | POA: Diagnosis not present

## 2016-06-14 DIAGNOSIS — I509 Heart failure, unspecified: Secondary | ICD-10-CM | POA: Diagnosis not present

## 2016-06-14 DIAGNOSIS — I2510 Atherosclerotic heart disease of native coronary artery without angina pectoris: Secondary | ICD-10-CM | POA: Diagnosis not present

## 2016-06-14 DIAGNOSIS — E039 Hypothyroidism, unspecified: Secondary | ICD-10-CM | POA: Diagnosis not present

## 2016-06-14 DIAGNOSIS — Z9181 History of falling: Secondary | ICD-10-CM | POA: Diagnosis not present

## 2016-06-14 DIAGNOSIS — M25551 Pain in right hip: Secondary | ICD-10-CM | POA: Diagnosis not present

## 2016-06-15 DIAGNOSIS — M810 Age-related osteoporosis without current pathological fracture: Secondary | ICD-10-CM | POA: Diagnosis not present

## 2016-06-15 DIAGNOSIS — I2510 Atherosclerotic heart disease of native coronary artery without angina pectoris: Secondary | ICD-10-CM | POA: Diagnosis not present

## 2016-06-15 DIAGNOSIS — Z5181 Encounter for therapeutic drug level monitoring: Secondary | ICD-10-CM | POA: Diagnosis not present

## 2016-06-15 DIAGNOSIS — Z7901 Long term (current) use of anticoagulants: Secondary | ICD-10-CM | POA: Diagnosis not present

## 2016-06-15 DIAGNOSIS — J449 Chronic obstructive pulmonary disease, unspecified: Secondary | ICD-10-CM | POA: Diagnosis not present

## 2016-06-15 DIAGNOSIS — R339 Retention of urine, unspecified: Secondary | ICD-10-CM | POA: Diagnosis not present

## 2016-06-15 DIAGNOSIS — D509 Iron deficiency anemia, unspecified: Secondary | ICD-10-CM | POA: Diagnosis not present

## 2016-06-15 DIAGNOSIS — I480 Paroxysmal atrial fibrillation: Secondary | ICD-10-CM | POA: Diagnosis not present

## 2016-06-15 DIAGNOSIS — Z9181 History of falling: Secondary | ICD-10-CM | POA: Diagnosis not present

## 2016-06-15 DIAGNOSIS — I509 Heart failure, unspecified: Secondary | ICD-10-CM | POA: Diagnosis not present

## 2016-06-15 DIAGNOSIS — M25551 Pain in right hip: Secondary | ICD-10-CM | POA: Diagnosis not present

## 2016-06-15 DIAGNOSIS — E039 Hypothyroidism, unspecified: Secondary | ICD-10-CM | POA: Diagnosis not present

## 2016-06-16 ENCOUNTER — Ambulatory Visit (INDEPENDENT_AMBULATORY_CARE_PROVIDER_SITE_OTHER): Payer: Medicare Other | Admitting: General Practice

## 2016-06-16 DIAGNOSIS — I509 Heart failure, unspecified: Secondary | ICD-10-CM | POA: Diagnosis not present

## 2016-06-16 DIAGNOSIS — D509 Iron deficiency anemia, unspecified: Secondary | ICD-10-CM | POA: Diagnosis not present

## 2016-06-16 DIAGNOSIS — I2510 Atherosclerotic heart disease of native coronary artery without angina pectoris: Secondary | ICD-10-CM | POA: Diagnosis not present

## 2016-06-16 DIAGNOSIS — Z5181 Encounter for therapeutic drug level monitoring: Secondary | ICD-10-CM | POA: Diagnosis not present

## 2016-06-16 DIAGNOSIS — M810 Age-related osteoporosis without current pathological fracture: Secondary | ICD-10-CM | POA: Diagnosis not present

## 2016-06-16 DIAGNOSIS — E039 Hypothyroidism, unspecified: Secondary | ICD-10-CM | POA: Diagnosis not present

## 2016-06-16 DIAGNOSIS — J449 Chronic obstructive pulmonary disease, unspecified: Secondary | ICD-10-CM | POA: Diagnosis not present

## 2016-06-16 DIAGNOSIS — I480 Paroxysmal atrial fibrillation: Secondary | ICD-10-CM | POA: Diagnosis not present

## 2016-06-16 DIAGNOSIS — R339 Retention of urine, unspecified: Secondary | ICD-10-CM | POA: Diagnosis not present

## 2016-06-16 DIAGNOSIS — Z7901 Long term (current) use of anticoagulants: Secondary | ICD-10-CM | POA: Diagnosis not present

## 2016-06-16 DIAGNOSIS — M25551 Pain in right hip: Secondary | ICD-10-CM | POA: Diagnosis not present

## 2016-06-16 DIAGNOSIS — Z9181 History of falling: Secondary | ICD-10-CM | POA: Diagnosis not present

## 2016-06-16 LAB — POCT INR: INR: 3.7

## 2016-06-16 NOTE — Patient Instructions (Addendum)
Pre visit review using our clinic review tool, if applicable. No additional management support is needed unless otherwise documented below in the visit note. 

## 2016-06-21 ENCOUNTER — Telehealth: Payer: Self-pay | Admitting: Family Medicine

## 2016-06-21 DIAGNOSIS — R339 Retention of urine, unspecified: Secondary | ICD-10-CM | POA: Diagnosis not present

## 2016-06-21 DIAGNOSIS — I2510 Atherosclerotic heart disease of native coronary artery without angina pectoris: Secondary | ICD-10-CM | POA: Diagnosis not present

## 2016-06-21 DIAGNOSIS — D509 Iron deficiency anemia, unspecified: Secondary | ICD-10-CM | POA: Diagnosis not present

## 2016-06-21 DIAGNOSIS — I509 Heart failure, unspecified: Secondary | ICD-10-CM | POA: Diagnosis not present

## 2016-06-21 DIAGNOSIS — E039 Hypothyroidism, unspecified: Secondary | ICD-10-CM | POA: Diagnosis not present

## 2016-06-21 DIAGNOSIS — Z7901 Long term (current) use of anticoagulants: Secondary | ICD-10-CM | POA: Diagnosis not present

## 2016-06-21 DIAGNOSIS — M25551 Pain in right hip: Secondary | ICD-10-CM | POA: Diagnosis not present

## 2016-06-21 DIAGNOSIS — Z9181 History of falling: Secondary | ICD-10-CM | POA: Diagnosis not present

## 2016-06-21 DIAGNOSIS — I480 Paroxysmal atrial fibrillation: Secondary | ICD-10-CM | POA: Diagnosis not present

## 2016-06-21 DIAGNOSIS — M810 Age-related osteoporosis without current pathological fracture: Secondary | ICD-10-CM | POA: Diagnosis not present

## 2016-06-21 DIAGNOSIS — J449 Chronic obstructive pulmonary disease, unspecified: Secondary | ICD-10-CM | POA: Diagnosis not present

## 2016-06-21 DIAGNOSIS — Z5181 Encounter for therapeutic drug level monitoring: Secondary | ICD-10-CM | POA: Diagnosis not present

## 2016-06-21 NOTE — Telephone Encounter (Signed)
Danielle [name] with [facility] calling to report pt's INR  2.5  Danielle [name] would like a call back for orders.

## 2016-06-21 NOTE — Telephone Encounter (Signed)
error 

## 2016-06-21 NOTE — Telephone Encounter (Signed)
INR 2.5 is at goal.  No changes in Coumadin dose. Re-check in 2 weeks. Thanks, BJ

## 2016-06-21 NOTE — Telephone Encounter (Signed)
Edwena Felty is aware of orders & verbalized understanding.

## 2016-06-22 DIAGNOSIS — D509 Iron deficiency anemia, unspecified: Secondary | ICD-10-CM | POA: Diagnosis not present

## 2016-06-22 DIAGNOSIS — Z7901 Long term (current) use of anticoagulants: Secondary | ICD-10-CM | POA: Diagnosis not present

## 2016-06-22 DIAGNOSIS — Z9181 History of falling: Secondary | ICD-10-CM | POA: Diagnosis not present

## 2016-06-22 DIAGNOSIS — I480 Paroxysmal atrial fibrillation: Secondary | ICD-10-CM | POA: Diagnosis not present

## 2016-06-22 DIAGNOSIS — E039 Hypothyroidism, unspecified: Secondary | ICD-10-CM | POA: Diagnosis not present

## 2016-06-22 DIAGNOSIS — I2510 Atherosclerotic heart disease of native coronary artery without angina pectoris: Secondary | ICD-10-CM | POA: Diagnosis not present

## 2016-06-22 DIAGNOSIS — J449 Chronic obstructive pulmonary disease, unspecified: Secondary | ICD-10-CM | POA: Diagnosis not present

## 2016-06-22 DIAGNOSIS — R339 Retention of urine, unspecified: Secondary | ICD-10-CM | POA: Diagnosis not present

## 2016-06-22 DIAGNOSIS — I509 Heart failure, unspecified: Secondary | ICD-10-CM | POA: Diagnosis not present

## 2016-06-22 DIAGNOSIS — M25551 Pain in right hip: Secondary | ICD-10-CM | POA: Diagnosis not present

## 2016-06-22 DIAGNOSIS — M810 Age-related osteoporosis without current pathological fracture: Secondary | ICD-10-CM | POA: Diagnosis not present

## 2016-06-22 DIAGNOSIS — Z5181 Encounter for therapeutic drug level monitoring: Secondary | ICD-10-CM | POA: Diagnosis not present

## 2016-06-24 DIAGNOSIS — R339 Retention of urine, unspecified: Secondary | ICD-10-CM | POA: Diagnosis not present

## 2016-06-24 DIAGNOSIS — Z7901 Long term (current) use of anticoagulants: Secondary | ICD-10-CM | POA: Diagnosis not present

## 2016-06-24 DIAGNOSIS — J449 Chronic obstructive pulmonary disease, unspecified: Secondary | ICD-10-CM | POA: Diagnosis not present

## 2016-06-24 DIAGNOSIS — I2510 Atherosclerotic heart disease of native coronary artery without angina pectoris: Secondary | ICD-10-CM | POA: Diagnosis not present

## 2016-06-24 DIAGNOSIS — E039 Hypothyroidism, unspecified: Secondary | ICD-10-CM | POA: Diagnosis not present

## 2016-06-24 DIAGNOSIS — Z5181 Encounter for therapeutic drug level monitoring: Secondary | ICD-10-CM | POA: Diagnosis not present

## 2016-06-24 DIAGNOSIS — I480 Paroxysmal atrial fibrillation: Secondary | ICD-10-CM | POA: Diagnosis not present

## 2016-06-24 DIAGNOSIS — D509 Iron deficiency anemia, unspecified: Secondary | ICD-10-CM | POA: Diagnosis not present

## 2016-06-24 DIAGNOSIS — I509 Heart failure, unspecified: Secondary | ICD-10-CM | POA: Diagnosis not present

## 2016-06-24 DIAGNOSIS — M25551 Pain in right hip: Secondary | ICD-10-CM | POA: Diagnosis not present

## 2016-06-24 DIAGNOSIS — Z9181 History of falling: Secondary | ICD-10-CM | POA: Diagnosis not present

## 2016-06-24 DIAGNOSIS — M810 Age-related osteoporosis without current pathological fracture: Secondary | ICD-10-CM | POA: Diagnosis not present

## 2016-06-28 DIAGNOSIS — E039 Hypothyroidism, unspecified: Secondary | ICD-10-CM | POA: Diagnosis not present

## 2016-06-28 DIAGNOSIS — I2510 Atherosclerotic heart disease of native coronary artery without angina pectoris: Secondary | ICD-10-CM | POA: Diagnosis not present

## 2016-06-28 DIAGNOSIS — R339 Retention of urine, unspecified: Secondary | ICD-10-CM | POA: Diagnosis not present

## 2016-06-28 DIAGNOSIS — I480 Paroxysmal atrial fibrillation: Secondary | ICD-10-CM | POA: Diagnosis not present

## 2016-06-28 DIAGNOSIS — I509 Heart failure, unspecified: Secondary | ICD-10-CM | POA: Diagnosis not present

## 2016-06-28 DIAGNOSIS — M810 Age-related osteoporosis without current pathological fracture: Secondary | ICD-10-CM | POA: Diagnosis not present

## 2016-06-28 DIAGNOSIS — Z5181 Encounter for therapeutic drug level monitoring: Secondary | ICD-10-CM | POA: Diagnosis not present

## 2016-06-28 DIAGNOSIS — D509 Iron deficiency anemia, unspecified: Secondary | ICD-10-CM | POA: Diagnosis not present

## 2016-06-28 DIAGNOSIS — Z9181 History of falling: Secondary | ICD-10-CM | POA: Diagnosis not present

## 2016-06-28 DIAGNOSIS — Z7901 Long term (current) use of anticoagulants: Secondary | ICD-10-CM | POA: Diagnosis not present

## 2016-06-28 DIAGNOSIS — J449 Chronic obstructive pulmonary disease, unspecified: Secondary | ICD-10-CM | POA: Diagnosis not present

## 2016-06-28 DIAGNOSIS — M25551 Pain in right hip: Secondary | ICD-10-CM | POA: Diagnosis not present

## 2016-06-30 ENCOUNTER — Telehealth: Payer: Self-pay | Admitting: Family Medicine

## 2016-06-30 DIAGNOSIS — E039 Hypothyroidism, unspecified: Secondary | ICD-10-CM | POA: Diagnosis not present

## 2016-06-30 DIAGNOSIS — Z9181 History of falling: Secondary | ICD-10-CM | POA: Diagnosis not present

## 2016-06-30 DIAGNOSIS — J45909 Unspecified asthma, uncomplicated: Secondary | ICD-10-CM | POA: Diagnosis not present

## 2016-06-30 DIAGNOSIS — Z7901 Long term (current) use of anticoagulants: Secondary | ICD-10-CM | POA: Diagnosis not present

## 2016-06-30 DIAGNOSIS — Z5181 Encounter for therapeutic drug level monitoring: Secondary | ICD-10-CM | POA: Diagnosis not present

## 2016-06-30 DIAGNOSIS — R269 Unspecified abnormalities of gait and mobility: Secondary | ICD-10-CM | POA: Diagnosis not present

## 2016-06-30 DIAGNOSIS — I509 Heart failure, unspecified: Secondary | ICD-10-CM | POA: Diagnosis not present

## 2016-06-30 DIAGNOSIS — I480 Paroxysmal atrial fibrillation: Secondary | ICD-10-CM | POA: Diagnosis not present

## 2016-06-30 DIAGNOSIS — I2510 Atherosclerotic heart disease of native coronary artery without angina pectoris: Secondary | ICD-10-CM | POA: Diagnosis not present

## 2016-06-30 DIAGNOSIS — M810 Age-related osteoporosis without current pathological fracture: Secondary | ICD-10-CM | POA: Diagnosis not present

## 2016-06-30 DIAGNOSIS — R339 Retention of urine, unspecified: Secondary | ICD-10-CM | POA: Diagnosis not present

## 2016-06-30 DIAGNOSIS — J449 Chronic obstructive pulmonary disease, unspecified: Secondary | ICD-10-CM | POA: Diagnosis not present

## 2016-06-30 DIAGNOSIS — D509 Iron deficiency anemia, unspecified: Secondary | ICD-10-CM | POA: Diagnosis not present

## 2016-06-30 DIAGNOSIS — M25551 Pain in right hip: Secondary | ICD-10-CM | POA: Diagnosis not present

## 2016-06-30 NOTE — Telephone Encounter (Signed)
° °  Caren Griffins with [facility]  call to request 5 more visits for home health for skill nursing    [phone]

## 2016-07-01 ENCOUNTER — Telehealth: Payer: Self-pay | Admitting: Family Medicine

## 2016-07-01 DIAGNOSIS — M25551 Pain in right hip: Secondary | ICD-10-CM | POA: Diagnosis not present

## 2016-07-01 DIAGNOSIS — R339 Retention of urine, unspecified: Secondary | ICD-10-CM | POA: Diagnosis not present

## 2016-07-01 DIAGNOSIS — Z5181 Encounter for therapeutic drug level monitoring: Secondary | ICD-10-CM | POA: Diagnosis not present

## 2016-07-01 DIAGNOSIS — J449 Chronic obstructive pulmonary disease, unspecified: Secondary | ICD-10-CM | POA: Diagnosis not present

## 2016-07-01 DIAGNOSIS — Z7901 Long term (current) use of anticoagulants: Secondary | ICD-10-CM | POA: Diagnosis not present

## 2016-07-01 DIAGNOSIS — D509 Iron deficiency anemia, unspecified: Secondary | ICD-10-CM | POA: Diagnosis not present

## 2016-07-01 DIAGNOSIS — Z9181 History of falling: Secondary | ICD-10-CM | POA: Diagnosis not present

## 2016-07-01 DIAGNOSIS — E039 Hypothyroidism, unspecified: Secondary | ICD-10-CM | POA: Diagnosis not present

## 2016-07-01 DIAGNOSIS — I2510 Atherosclerotic heart disease of native coronary artery without angina pectoris: Secondary | ICD-10-CM | POA: Diagnosis not present

## 2016-07-01 DIAGNOSIS — I509 Heart failure, unspecified: Secondary | ICD-10-CM | POA: Diagnosis not present

## 2016-07-01 DIAGNOSIS — I480 Paroxysmal atrial fibrillation: Secondary | ICD-10-CM | POA: Diagnosis not present

## 2016-07-01 DIAGNOSIS — M810 Age-related osteoporosis without current pathological fracture: Secondary | ICD-10-CM | POA: Diagnosis not present

## 2016-07-01 NOTE — Telephone Encounter (Signed)
Danielle [name] is a physical therapist calling to obtain verbal orders to extend Home health 2x a week for 8 more weeks.

## 2016-07-01 NOTE — Telephone Encounter (Signed)
It is Ok to add 5 more visits as requested. Thanks, BJ

## 2016-07-01 NOTE — Telephone Encounter (Signed)
It is Ok to extend [facility] as requested. Thanks, BJ

## 2016-07-01 NOTE — Telephone Encounter (Signed)
Verbal given to [employer].

## 2016-07-01 NOTE — Telephone Encounter (Signed)
Left voicemail for Tillie Rung giving okay for verbal order. Advised to call back with any questions.

## 2016-07-05 ENCOUNTER — Telehealth: Payer: Self-pay | Admitting: Family Medicine

## 2016-07-05 DIAGNOSIS — I480 Paroxysmal atrial fibrillation: Secondary | ICD-10-CM | POA: Diagnosis not present

## 2016-07-05 DIAGNOSIS — Z5181 Encounter for therapeutic drug level monitoring: Secondary | ICD-10-CM | POA: Diagnosis not present

## 2016-07-05 DIAGNOSIS — I2510 Atherosclerotic heart disease of native coronary artery without angina pectoris: Secondary | ICD-10-CM | POA: Diagnosis not present

## 2016-07-05 DIAGNOSIS — R339 Retention of urine, unspecified: Secondary | ICD-10-CM | POA: Diagnosis not present

## 2016-07-05 DIAGNOSIS — M25551 Pain in right hip: Secondary | ICD-10-CM | POA: Diagnosis not present

## 2016-07-05 DIAGNOSIS — D509 Iron deficiency anemia, unspecified: Secondary | ICD-10-CM | POA: Diagnosis not present

## 2016-07-05 DIAGNOSIS — M810 Age-related osteoporosis without current pathological fracture: Secondary | ICD-10-CM | POA: Diagnosis not present

## 2016-07-05 DIAGNOSIS — J449 Chronic obstructive pulmonary disease, unspecified: Secondary | ICD-10-CM | POA: Diagnosis not present

## 2016-07-05 DIAGNOSIS — E039 Hypothyroidism, unspecified: Secondary | ICD-10-CM | POA: Diagnosis not present

## 2016-07-05 DIAGNOSIS — Z7901 Long term (current) use of anticoagulants: Secondary | ICD-10-CM | POA: Diagnosis not present

## 2016-07-05 DIAGNOSIS — I509 Heart failure, unspecified: Secondary | ICD-10-CM | POA: Diagnosis not present

## 2016-07-05 DIAGNOSIS — Z9181 History of falling: Secondary | ICD-10-CM | POA: Diagnosis not present

## 2016-07-05 NOTE — Telephone Encounter (Signed)
Danielle [name] with [facility] reports pts INR 3.2.  Please call back with dosing instructions.

## 2016-07-05 NOTE — Telephone Encounter (Signed)
It seems like she is on Coumadin 5 mg Wednesdays and Fridays, rest of days 2.5 mg. So decreased Coumadin dose from 5 mg to 2.5 mg Wednesdays. Rest unchanged (Fridays 5 mg and rest of days 2.5 mg), INR in 7 days.  Thanks, BJ

## 2016-07-05 NOTE — Telephone Encounter (Signed)
Pt needs new rx hydrocodone °

## 2016-07-06 NOTE — Telephone Encounter (Signed)
Danielle [name] is aware of the dosage changes for the Coumadin and to recheck the INR in 7 days.

## 2016-07-07 ENCOUNTER — Other Ambulatory Visit: Payer: Self-pay | Admitting: Family Medicine

## 2016-07-07 DIAGNOSIS — R339 Retention of urine, unspecified: Secondary | ICD-10-CM | POA: Diagnosis not present

## 2016-07-07 DIAGNOSIS — J449 Chronic obstructive pulmonary disease, unspecified: Secondary | ICD-10-CM | POA: Diagnosis not present

## 2016-07-07 DIAGNOSIS — M159 Polyosteoarthritis, unspecified: Secondary | ICD-10-CM

## 2016-07-07 DIAGNOSIS — M810 Age-related osteoporosis without current pathological fracture: Secondary | ICD-10-CM | POA: Diagnosis not present

## 2016-07-07 DIAGNOSIS — I480 Paroxysmal atrial fibrillation: Secondary | ICD-10-CM | POA: Diagnosis not present

## 2016-07-07 DIAGNOSIS — E039 Hypothyroidism, unspecified: Secondary | ICD-10-CM | POA: Diagnosis not present

## 2016-07-07 DIAGNOSIS — Z7901 Long term (current) use of anticoagulants: Secondary | ICD-10-CM | POA: Diagnosis not present

## 2016-07-07 DIAGNOSIS — Z9181 History of falling: Secondary | ICD-10-CM | POA: Diagnosis not present

## 2016-07-07 DIAGNOSIS — M25551 Pain in right hip: Secondary | ICD-10-CM | POA: Diagnosis not present

## 2016-07-07 DIAGNOSIS — I509 Heart failure, unspecified: Secondary | ICD-10-CM | POA: Diagnosis not present

## 2016-07-07 DIAGNOSIS — Z5181 Encounter for therapeutic drug level monitoring: Secondary | ICD-10-CM | POA: Diagnosis not present

## 2016-07-07 DIAGNOSIS — D509 Iron deficiency anemia, unspecified: Secondary | ICD-10-CM | POA: Diagnosis not present

## 2016-07-07 DIAGNOSIS — I2510 Atherosclerotic heart disease of native coronary artery without angina pectoris: Secondary | ICD-10-CM | POA: Diagnosis not present

## 2016-07-07 MED ORDER — HYDROCODONE-ACETAMINOPHEN 5-325 MG PO TABS: 1.0000 | ORAL_TABLET | Freq: Three times a day (TID) | ORAL | 0 refills | Status: DC | PRN

## 2016-07-07 NOTE — Telephone Encounter (Signed)
Patient aware Rx is up front & ready for pick up.  Pharmacy said they have her last name as Terra, with no dash.

## 2016-07-07 NOTE — Telephone Encounter (Signed)
Can you please contact pharmacy to ask name and DOB under which she is having Hydrocodone fill. I cannot find her on [employer] controlled med web site.  It seems like last refill was 05/20/16, so based on this Hydrocodone-Acetaminophen Rx can be pick up.  Thanks, BJ

## 2016-07-08 ENCOUNTER — Encounter: Payer: Self-pay | Admitting: Internal Medicine

## 2016-07-08 ENCOUNTER — Ambulatory Visit (INDEPENDENT_AMBULATORY_CARE_PROVIDER_SITE_OTHER): Payer: Medicare Other | Admitting: Internal Medicine

## 2016-07-08 VITALS — BP 120/80 | HR 83 | Ht 67.0 in | Wt 210.0 lb

## 2016-07-08 DIAGNOSIS — Z952 Presence of prosthetic heart valve: Secondary | ICD-10-CM | POA: Diagnosis not present

## 2016-07-08 DIAGNOSIS — I5042 Chronic combined systolic (congestive) and diastolic (congestive) heart failure: Secondary | ICD-10-CM | POA: Diagnosis not present

## 2016-07-08 DIAGNOSIS — I480 Paroxysmal atrial fibrillation: Secondary | ICD-10-CM | POA: Diagnosis not present

## 2016-07-08 DIAGNOSIS — E875 Hyperkalemia: Secondary | ICD-10-CM

## 2016-07-08 DIAGNOSIS — I10 Essential (primary) hypertension: Secondary | ICD-10-CM

## 2016-07-08 DIAGNOSIS — R0602 Shortness of breath: Secondary | ICD-10-CM | POA: Diagnosis not present

## 2016-07-08 DIAGNOSIS — I428 Other cardiomyopathies: Secondary | ICD-10-CM

## 2016-07-08 DIAGNOSIS — I5022 Chronic systolic (congestive) heart failure: Secondary | ICD-10-CM | POA: Diagnosis not present

## 2016-07-08 MED ORDER — CARVEDILOL 12.5 MG PO TABS: 12.5000 mg | ORAL_TABLET | Freq: Two times a day (BID) | ORAL | 1 refills | Status: DC

## 2016-07-08 NOTE — Progress Notes (Signed)
Follow-up Outpatient Visit Date: 07/08/2016  Primary Care Provider: [name], [name], MD [address] [place] [address]  Chief Complaint: Dyspnea on exertion  HPI:  [name] is a 71 y.o. year-old female with history of aortic valve disease (severe regurgitation by her description) s/p bioprosthetic aortic valve replacement in [place] in 03/2014, NICM with LVEF as low as 30-35% by report in 03/2015, paroxysmal atrial fibrillation on chronic warfarin, stroke x 2, hyperlipidemia, hypertension, hyperthyroidism, spontaneous pneumothorax, and left hip fracture, who presents for follow-up of dyspnea exertion in the setting of nonischemic cardiomyopathy and aortic valve disease. I last saw the patient on 04/08/16 for initial visit. At that time, she was largely immobile secondary to her prior hip fracture last fall. She has come more active but continues to ambulate with a walker and cane. She also notes exertional dyspnea is unchanged from our last visit. She is not had any chest pain, palpitations, lightheadedness, or edema. She has gained about 5 pounds since our last visit, which she attributes to diet and inactivity. She has long-standing 2 pillow orthopnea, unchanged. She has not needed to take her as needed furosemide on a regular basis. She remains compliant with her medications, including warfarin. She has not had any bleeding or neurologic changes.  --------------------------------------------------------------------------------------------------  Cardiovascular History & Procedures: Cardiovascular Problems:  Aortic valve disease status post bioprosthetic AVR in 03/2014  Non-ischemic cardiomyopathy  Paroxysmal atrial fibrillation  Stroke  Risk Factors:  Hypertension, hyperlipidemia, stroke, and age > 45  Cath/PCI:  None available (patient reports cath without significant CAD in the past)  CV Surgery:  Bioprosthetic aortic valve replacement (03/12/14, [place], [place])  EP Procedures and Devices:  None  Non-Invasive Evaluation(s):  TTE (04/21/16): Normal obese size with moderate LVH. LVEF 35-40% with mid and apical anteroseptal hypokinesis. Grade 3 diastolic dysfunction noted. Aortic valve bioprosthesis present with a mean gradient of 11 mmHg. Mitral annular calcification noted. Normal RV size and function. Mild right atrial enlargement.  TTE (03/27/15, OSH): Mild LVH with LVEF 30-35%, mild left atrial enlargement, AVR in place without regurgitation.  TTE (05/10/14, OSH): LVEF 45-50%  Recent CV Pertinent Labs: Lab Results  Component Value Date   INR 3.7 06/16/2016   INR 4.1 (A) 05/25/2016   K 5.2 07/08/2016   BUN 19 07/08/2016   CREATININE 0.98 07/08/2016    Past medical and surgical history were reviewed and updated in EPIC.  Outpatient Encounter Prescriptions as of 07/08/2016  Medication Sig  . albuterol (PROAIR HFA) 108 (90 Base) MCG/ACT inhaler Inhale 2 puffs into the lungs every 6 (six) hours as needed for wheezing or shortness of breath.   Marland Kitchen alendronate (FOSAMAX) 70 MG tablet Take 1 tablet (70 mg total) by mouth once a week. with a full glass of water on an empty stomach.  Marland Kitchen atorvastatin (LIPITOR) 40 MG tablet Take 1 tablet (40 mg total) by mouth at bedtime.  . budesonide-formoterol (SYMBICORT) 160-4.5 MCG/ACT inhaler Inhale 2 puffs into the lungs 2 (two) times daily.  . carvedilol (COREG) 12.5 MG tablet Take 1 tablet (12.5 mg total) by mouth 2 (two) times daily.  . Cholecalciferol (VITAMIN D3) 400 units tablet Take 1 tablet (400 Units total) by mouth daily.  . diclofenac sodium (VOLTAREN) 1 % GEL Apply 4 g topically 4 (four) times daily.  . DULoxetine (CYMBALTA) 30 MG capsule Take 1 capsule (30 mg total) by mouth daily.  . furosemide (LASIX) 20 MG tablet Take 1 tablet (20 mg total)  by mouth daily as needed for fluid or edema.  Marland Kitchen HYDROcodone-acetaminophen (NORCO/VICODIN) 5-325 MG tablet Take 1 tablet by mouth every 8  (eight) hours as needed for moderate pain.  . methimazole (TAPAZOLE) 5 MG tablet Take 15 mg by mouth every evening.  . ranitidine (ZANTAC) 150 MG tablet Take 1 tablet (150 mg total) by mouth 2 (two) times daily.  Marland Kitchen senna-docusate (SENOKOT-S) 8.6-50 MG tablet Take 1 tablet by mouth 2 (two) times daily.   Marland Kitchen warfarin (COUMADIN) 5 MG tablet Take 5 mg on Sun, Mon, Tues, Wed, and Thurs. Take 7.5 mg on Saturdays.  . [DISCONTINUED] carvedilol (COREG) 12.5 MG tablet Take 1 tablet (12.5 mg total) by mouth 2 (two) times daily.  Marland Kitchen losartan (COZAAR) 25 MG tablet Take 1 tablet (25 mg total) by mouth daily.   No facility-administered encounter medications on file as of 07/08/2016.     Allergies: Lisinopril and Tetanus toxoid adsorbed  Social History   Social History  . Marital status: Married    Spouse name: Sherwood  . Number of children: 0  . Years of education: N/A   Occupational History  . Retired in 2004    Social History Main Topics  . Smoking status: Former Smoker    Packs/day: 0.25    Years: 10.00    Types: Cigarettes    Quit date: 03/01/2010  . Smokeless tobacco: Never Used  . Alcohol use No  . Drug use: No  . Sexual activity: Not on file   Other Topics Concern  . Not on file   Social History Narrative   Lives with husband.  Ambulated independently.    Family History  Problem Relation Age of Onset  . Diabetes Mother   . Heart attack Mother 46  . Diabetes Father   . Lung cancer Father   . Diabetes Sister   . Thyroid disease Sister   . Diabetes Sister   . HIV Brother     Review of Systems: A 12-system review of systems was performed and was negative except as noted in the HPI.  --------------------------------------------------------------------------------------------------  Physical Exam: BP 120/80 (BP Location: Right Arm, Patient Position: Sitting, Cuff Size: Large)   Pulse 83   Ht 5\' 7"  (1.702 m)   Wt 210 lb (95.3 kg)   SpO2 99%   BMI 32.89 kg/m   General:   Obese woman, seated comfortably in a wheelchair. HEENT: No conjunctival pallor or scleral icterus.  Moist mucous membranes.  OP clear. Neck: Supple without lymphadenopathy, thyromegaly, JVD, or HJR.  Lungs: Normal work of breathing.  Clear to auscultation bilaterally without wheezes or crackles. Heart: Regular rate and rhythm without murmurs, rubs, or gallops.  Normal S1 and S2. Unable to assess PMI due to body habitus. Abd: Bowel sounds present.  Soft, NT/ND. Unable to assess HSM due to body habitus. Ext: No lower extremity edema.  Radial, PT, and DP pulses are 2+ bilaterally. Skin: warm and dry without rash  EKG:  Normal sinus rhythm with inferior/posterior Q waves and anterolateral Q waves. LVH less pronounced today. Otherwise, there has been no significant change from prior tracing on 04/08/16.  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 True assessment of her functional capacity is limited due to continued immobility from her left hip fracture in the fall. She is tolerating her current medication regimen well. We will check a basic metabolic panel today, given history of borderline hyperkalemia in the past. If her potassium allows, we will increase losartan to 50 mg daily. The patient should remain on her current  dose of carvedilol.  Shortness of breath This has been a chronic problem for the patient. I suspect this is multifactorial, including her combined systolic and diastolic heart failure as well as underlying lung disease and deconditioning. I have encouraged her to continue working with physical therapy and to increase her activity as tolerated. We will increase losartan if her electrolytes allow, to optimize her heart failure regimen.  History of aortic valve replacement Recent echo showed appropriate bioprosthetic valve function. Patient is currently on warfarin for history of paroxysmal atrial fibrillation and stroke. We will defer adding aspirin at this time.  Paroxysmal atrial fibrillation No symptoms to suggest recurrent atrial fibrillation. EKG today demonstrates normal sinus rhythm. Given her history of elevated CHADSVASc score (at least 6), we will continue with indefinite warfarin therapy. Continue current doses of carvedilol as well.  Essential hypertension Blood pressure is reasonable today. As above, we will check BMP today in anticipation of increasing losartan to 50 mg daily.  Follow-up: Return to clinic in 3 months.  [name], MD 07/10/2016 4:05 PM

## 2016-07-08 NOTE — Patient Instructions (Signed)
Medication Instructions:  Your physician recommends that you continue on your current medications as directed. Please refer to the Current Medication list given to you today.   Labwork: BMET today  Testing/Procedures: None   Follow-Up: Your physician recommends that you schedule a follow-up appointment in: 3 months with Dr End.   Any Other Special Instructions Will Be Listed Below (If Applicable).     If you need a refill on your cardiac medications before your next appointment, please call your pharmacy.

## 2016-07-09 DIAGNOSIS — Z5181 Encounter for therapeutic drug level monitoring: Secondary | ICD-10-CM | POA: Diagnosis not present

## 2016-07-09 DIAGNOSIS — I480 Paroxysmal atrial fibrillation: Secondary | ICD-10-CM | POA: Diagnosis not present

## 2016-07-09 DIAGNOSIS — M810 Age-related osteoporosis without current pathological fracture: Secondary | ICD-10-CM | POA: Diagnosis not present

## 2016-07-09 DIAGNOSIS — Z9181 History of falling: Secondary | ICD-10-CM | POA: Diagnosis not present

## 2016-07-09 DIAGNOSIS — M25551 Pain in right hip: Secondary | ICD-10-CM | POA: Diagnosis not present

## 2016-07-09 DIAGNOSIS — Z7901 Long term (current) use of anticoagulants: Secondary | ICD-10-CM | POA: Diagnosis not present

## 2016-07-09 DIAGNOSIS — I509 Heart failure, unspecified: Secondary | ICD-10-CM | POA: Diagnosis not present

## 2016-07-09 DIAGNOSIS — R339 Retention of urine, unspecified: Secondary | ICD-10-CM | POA: Diagnosis not present

## 2016-07-09 DIAGNOSIS — D509 Iron deficiency anemia, unspecified: Secondary | ICD-10-CM | POA: Diagnosis not present

## 2016-07-09 DIAGNOSIS — I2510 Atherosclerotic heart disease of native coronary artery without angina pectoris: Secondary | ICD-10-CM | POA: Diagnosis not present

## 2016-07-09 DIAGNOSIS — J449 Chronic obstructive pulmonary disease, unspecified: Secondary | ICD-10-CM | POA: Diagnosis not present

## 2016-07-09 DIAGNOSIS — E039 Hypothyroidism, unspecified: Secondary | ICD-10-CM | POA: Diagnosis not present

## 2016-07-09 LAB — BASIC METABOLIC PANEL
BUN/Creatinine Ratio: 19 (ref 12–28)
BUN: 19 mg/dL (ref 8–27)
CALCIUM: 10.1 mg/dL (ref 8.7–10.3)
CHLORIDE: 102 mmol/L (ref 96–106)
CO2: 24 mmol/L (ref 18–29)
CREATININE: 0.98 mg/dL (ref 0.57–1.00)
GFR calc Af Amer: 68 mL/min/{1.73_m2} (ref >59)
GFR calc non Af Amer: 59 mL/min/{1.73_m2} — ABNORMAL LOW (ref >59)
GLUCOSE: 85 mg/dL (ref 65–99)
Potassium: 5.2 mmol/L (ref 3.5–5.2)
Sodium: 140 mmol/L (ref 134–144)

## 2016-07-12 ENCOUNTER — Telehealth: Payer: Self-pay | Admitting: Family Medicine

## 2016-07-12 ENCOUNTER — Other Ambulatory Visit: Payer: Self-pay | Admitting: Family Medicine

## 2016-07-12 DIAGNOSIS — M25551 Pain in right hip: Secondary | ICD-10-CM | POA: Diagnosis not present

## 2016-07-12 DIAGNOSIS — M810 Age-related osteoporosis without current pathological fracture: Secondary | ICD-10-CM | POA: Diagnosis not present

## 2016-07-12 DIAGNOSIS — I480 Paroxysmal atrial fibrillation: Secondary | ICD-10-CM | POA: Diagnosis not present

## 2016-07-12 DIAGNOSIS — D509 Iron deficiency anemia, unspecified: Secondary | ICD-10-CM | POA: Diagnosis not present

## 2016-07-12 DIAGNOSIS — I509 Heart failure, unspecified: Secondary | ICD-10-CM | POA: Diagnosis not present

## 2016-07-12 DIAGNOSIS — Z5181 Encounter for therapeutic drug level monitoring: Secondary | ICD-10-CM | POA: Diagnosis not present

## 2016-07-12 DIAGNOSIS — Z952 Presence of prosthetic heart valve: Secondary | ICD-10-CM

## 2016-07-12 DIAGNOSIS — I428 Other cardiomyopathies: Secondary | ICD-10-CM

## 2016-07-12 DIAGNOSIS — R0602 Shortness of breath: Secondary | ICD-10-CM

## 2016-07-12 DIAGNOSIS — R339 Retention of urine, unspecified: Secondary | ICD-10-CM | POA: Diagnosis not present

## 2016-07-12 DIAGNOSIS — J449 Chronic obstructive pulmonary disease, unspecified: Secondary | ICD-10-CM | POA: Diagnosis not present

## 2016-07-12 DIAGNOSIS — E039 Hypothyroidism, unspecified: Secondary | ICD-10-CM | POA: Diagnosis not present

## 2016-07-12 DIAGNOSIS — Z7901 Long term (current) use of anticoagulants: Secondary | ICD-10-CM | POA: Diagnosis not present

## 2016-07-12 DIAGNOSIS — Z9181 History of falling: Secondary | ICD-10-CM | POA: Diagnosis not present

## 2016-07-12 DIAGNOSIS — I2510 Atherosclerotic heart disease of native coronary artery without angina pectoris: Secondary | ICD-10-CM | POA: Diagnosis not present

## 2016-07-12 MED ORDER — WARFARIN SODIUM 3 MG PO TABS: 3.0000 mg | ORAL_TABLET | Freq: Every day | ORAL | 2 refills | Status: DC

## 2016-07-12 NOTE — Telephone Encounter (Signed)
Danielle [name] with [facility] reports pt  INR 1.9

## 2016-07-12 NOTE — Telephone Encounter (Signed)
INR subtherapeutic.  Coumadin changed to 3 mg daily. Comadin 3 mg tabs sent to her pharmacy. INR in 8 days.  Thanks, BJ

## 2016-07-13 ENCOUNTER — Telehealth: Payer: Self-pay | Admitting: *Deleted

## 2016-07-13 MED ORDER — WARFARIN SODIUM 3 MG PO TABS: 3.0000 mg | ORAL_TABLET | Freq: Every day | ORAL | 2 refills | Status: DC

## 2016-07-13 MED ORDER — CARVEDILOL 25 MG PO TABS: 25.0000 mg | ORAL_TABLET | Freq: Two times a day (BID) | ORAL | 1 refills | Status: DC

## 2016-07-13 NOTE — Telephone Encounter (Signed)
Pt calling in to get her next dosage for her INR and wanted to know why it was called into the pharmacy and not [place].  Pt state that she would like to have a call back at [phone] she also state that she spoke with [place] and they did not have any info on her INR and said it was called into the pharmacy and did not know why.

## 2016-07-13 NOTE — Telephone Encounter (Signed)
Left voicemail for Danielle [name] letting her know that new Rx for Coumadin was sent to patient's pharmacy and to re-check INR in 8 days. Advised to call back with any questions.

## 2016-07-13 NOTE — Telephone Encounter (Signed)
Notes recorded by [name], MD on 07/12/2016 at 7:49 AM EDT Please let [name] know that her labs are stable, though her potassium remains upper normal. I am therefore hesitant to increase losartan further at this time. I suggest that we increase carvedilol to 25 mg BID and f/u as previously discussed. Thanks.

## 2016-07-13 NOTE — Telephone Encounter (Signed)
Called and spoke with patient. Advised her that the dosage has been changed to 3 mg and a new Rx was sent in to make sure that she was getting the correct dosage instead of trying to cut the 5 mg tablet into 3 mg. Patient verbalized understanding.

## 2016-07-14 ENCOUNTER — Ambulatory Visit: Payer: Self-pay | Admitting: General Practice

## 2016-07-14 ENCOUNTER — Telehealth: Payer: Self-pay | Admitting: Endocrinology

## 2016-07-14 DIAGNOSIS — I480 Paroxysmal atrial fibrillation: Secondary | ICD-10-CM | POA: Diagnosis not present

## 2016-07-14 DIAGNOSIS — Z5181 Encounter for therapeutic drug level monitoring: Secondary | ICD-10-CM | POA: Diagnosis not present

## 2016-07-14 DIAGNOSIS — E039 Hypothyroidism, unspecified: Secondary | ICD-10-CM | POA: Diagnosis not present

## 2016-07-14 DIAGNOSIS — I509 Heart failure, unspecified: Secondary | ICD-10-CM | POA: Diagnosis not present

## 2016-07-14 DIAGNOSIS — R339 Retention of urine, unspecified: Secondary | ICD-10-CM | POA: Diagnosis not present

## 2016-07-14 DIAGNOSIS — M810 Age-related osteoporosis without current pathological fracture: Secondary | ICD-10-CM | POA: Diagnosis not present

## 2016-07-14 DIAGNOSIS — J449 Chronic obstructive pulmonary disease, unspecified: Secondary | ICD-10-CM | POA: Diagnosis not present

## 2016-07-14 DIAGNOSIS — M25551 Pain in right hip: Secondary | ICD-10-CM | POA: Diagnosis not present

## 2016-07-14 DIAGNOSIS — I2510 Atherosclerotic heart disease of native coronary artery without angina pectoris: Secondary | ICD-10-CM | POA: Diagnosis not present

## 2016-07-14 DIAGNOSIS — D509 Iron deficiency anemia, unspecified: Secondary | ICD-10-CM | POA: Diagnosis not present

## 2016-07-14 DIAGNOSIS — Z9181 History of falling: Secondary | ICD-10-CM | POA: Diagnosis not present

## 2016-07-14 DIAGNOSIS — Z7901 Long term (current) use of anticoagulants: Secondary | ICD-10-CM | POA: Diagnosis not present

## 2016-07-14 MED ORDER — METHIMAZOLE 5 MG PO TABS: 15.0000 mg | ORAL_TABLET | Freq: Every evening | ORAL | 1 refills | Status: DC

## 2016-07-14 NOTE — Telephone Encounter (Signed)
Pt called in and requested her Methimazole be refilled and sent to the [facility].

## 2016-07-14 NOTE — Telephone Encounter (Signed)
Refill submitted. 

## 2016-07-19 ENCOUNTER — Telehealth: Payer: Self-pay | Admitting: Family Medicine

## 2016-07-19 DIAGNOSIS — I509 Heart failure, unspecified: Secondary | ICD-10-CM | POA: Diagnosis not present

## 2016-07-19 DIAGNOSIS — R339 Retention of urine, unspecified: Secondary | ICD-10-CM | POA: Diagnosis not present

## 2016-07-19 DIAGNOSIS — M810 Age-related osteoporosis without current pathological fracture: Secondary | ICD-10-CM | POA: Diagnosis not present

## 2016-07-19 DIAGNOSIS — M25551 Pain in right hip: Secondary | ICD-10-CM | POA: Diagnosis not present

## 2016-07-19 DIAGNOSIS — I480 Paroxysmal atrial fibrillation: Secondary | ICD-10-CM | POA: Diagnosis not present

## 2016-07-19 DIAGNOSIS — E039 Hypothyroidism, unspecified: Secondary | ICD-10-CM | POA: Diagnosis not present

## 2016-07-19 DIAGNOSIS — Z5181 Encounter for therapeutic drug level monitoring: Secondary | ICD-10-CM | POA: Diagnosis not present

## 2016-07-19 DIAGNOSIS — J449 Chronic obstructive pulmonary disease, unspecified: Secondary | ICD-10-CM | POA: Diagnosis not present

## 2016-07-19 DIAGNOSIS — I2510 Atherosclerotic heart disease of native coronary artery without angina pectoris: Secondary | ICD-10-CM | POA: Diagnosis not present

## 2016-07-19 DIAGNOSIS — Z9181 History of falling: Secondary | ICD-10-CM | POA: Diagnosis not present

## 2016-07-19 DIAGNOSIS — Z7901 Long term (current) use of anticoagulants: Secondary | ICD-10-CM | POA: Diagnosis not present

## 2016-07-19 DIAGNOSIS — D509 Iron deficiency anemia, unspecified: Secondary | ICD-10-CM | POA: Diagnosis not present

## 2016-07-19 NOTE — Telephone Encounter (Signed)
She is not following with our Coumadin Clinic, she is still using [facility].

## 2016-07-19 NOTE — Telephone Encounter (Signed)
Please advise 

## 2016-07-19 NOTE — Telephone Encounter (Signed)
Called and spoke with [place]. Went over below message & called her phone back to leave the message on her secure voicemail.

## 2016-07-19 NOTE — Telephone Encounter (Signed)
Danielle [name],  Because I don't see this patient in the clinic, Dr. [name] will need to dose patient.  Thanks.

## 2016-07-19 NOTE — Telephone Encounter (Signed)
Increase Coumadin from 3 mg daily to 4.5 mg (1.5 tab) Monday's and rest continue 3 mg. INR in 8 days. Remind pt to keep greens intake stable, including type and amount. Or stop consumption of greens if she feels like it is difficult to measure it. Thanks

## 2016-07-19 NOTE — Telephone Encounter (Signed)
Danielle [name] is calling with pts INR results 23.3 and 1.9

## 2016-07-20 DIAGNOSIS — M25551 Pain in right hip: Secondary | ICD-10-CM | POA: Diagnosis not present

## 2016-07-20 DIAGNOSIS — R339 Retention of urine, unspecified: Secondary | ICD-10-CM | POA: Diagnosis not present

## 2016-07-20 DIAGNOSIS — M810 Age-related osteoporosis without current pathological fracture: Secondary | ICD-10-CM | POA: Diagnosis not present

## 2016-07-20 DIAGNOSIS — D509 Iron deficiency anemia, unspecified: Secondary | ICD-10-CM | POA: Diagnosis not present

## 2016-07-20 DIAGNOSIS — I509 Heart failure, unspecified: Secondary | ICD-10-CM | POA: Diagnosis not present

## 2016-07-20 DIAGNOSIS — I2510 Atherosclerotic heart disease of native coronary artery without angina pectoris: Secondary | ICD-10-CM | POA: Diagnosis not present

## 2016-07-20 DIAGNOSIS — Z7901 Long term (current) use of anticoagulants: Secondary | ICD-10-CM | POA: Diagnosis not present

## 2016-07-20 DIAGNOSIS — J449 Chronic obstructive pulmonary disease, unspecified: Secondary | ICD-10-CM | POA: Diagnosis not present

## 2016-07-20 DIAGNOSIS — E039 Hypothyroidism, unspecified: Secondary | ICD-10-CM | POA: Diagnosis not present

## 2016-07-20 DIAGNOSIS — Z9181 History of falling: Secondary | ICD-10-CM | POA: Diagnosis not present

## 2016-07-20 DIAGNOSIS — Z5181 Encounter for therapeutic drug level monitoring: Secondary | ICD-10-CM | POA: Diagnosis not present

## 2016-07-20 DIAGNOSIS — I480 Paroxysmal atrial fibrillation: Secondary | ICD-10-CM | POA: Diagnosis not present

## 2016-07-21 NOTE — Progress Notes (Signed)
HPI:   [name] is a 71 y.o. female, who is here today with her husband to follow on some chronic medical problems.  Chronic pain: She is on Hydrocodone-Acetaminophen 5-325 mg tid as needed, she is taking it in average once daily, usually when pain is severe.She feels like medication helps greatly, she can move easier, denies side effects. Diclofenac gel was not covered by her health insurance.  Pain is sharp/achy , 6-7/10, exacerbated by walking and standing, alleviated by rest.   Lower back, right hip and knee mainly. Last OV she agreed with trying Cymbalta 30 mg daily , which she has tolerated well. She doesn't feel like medication is helping with pain, she denies depressed mood. She is having PT 2 times per week. She is in her wheel chair most of the time, she uses the walker when she is doing PT.  She states that she feels "unbalanced ","dizzy", not as bad as she did in the past. Mentions Hx of "vertigo" , severe episode in 2010 with nausea and vomiting. She cannot described sensation but it does not usually happen in bed, the she says "sometimes." She is not sure about exacerbating factors, alleviated some by lying down. No associated chest pain, palpitations,diaphoresis,or dyspnea.  She has Hx of CVD, not sure if this was a sequelae from this event.  Chronic anticoagulation: INR has been subtherapeutic, she is having INR done at home. She has not been consistent with green lifty vegetable intake. Currently she is on Coumadin 3 mg x 6 days and 4.5 mg x 1 day.  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 CHF with LVEF in 03/2015 30-35% She also has Hx of paroxysmal atrial fib.    Concerns today:   Right shoulder pain:  4 days ago she fell while visiting her sister at the hospital,she was leaning on chair and it slipped.She landed on hyperextended right hand trying to break fall. She had wrist and  elbow pain initially but now she is just having right shoulder pain, severe,limitation of movement. She has not noted edema,erythema,or deformity. She denies prior Hx of shoulder pain.  Hx of osteoporosis , she is on Fosamax.   -She also burned right thigh accidentally with an iron 4 days ago in the morning. She has kept area covered. It seems like getting better, she has not noted drainage or worsening erythema.    Review of Systems  Constitutional: Positive for fatigue. Negative for activity change, appetite change, fever and unexpected weight change.  HENT: Negative for mouth sores, nosebleeds and trouble swallowing.   Eyes: Negative for redness and visual disturbance.  Respiratory: Negative for cough, shortness of breath and wheezing.   Cardiovascular: Negative for chest pain, palpitations and leg swelling.  Gastrointestinal: Negative for abdominal pain, blood in stool, nausea and vomiting.       Negative for changes in bowel habits.  Genitourinary: Negative for decreased urine volume and hematuria.  Musculoskeletal: Positive for arthralgias, back pain and gait problem.  Allergic/Immunologic: Positive for environmental allergies.  Neurological: Negative for syncope, weakness and headaches.  Hematological: Does not bruise/bleed easily.  Psychiatric/Behavioral: Negative for confusion. The patient is nervous/anxious.      Current Outpatient Prescriptions on File Prior to Visit  Medication Sig Dispense Refill  . albuterol (PROAIR HFA) 108 (90 Base) MCG/ACT inhaler Inhale 2 puffs into the lungs every 6 (six) hours as needed for wheezing or shortness of breath.     Marland Kitchen  alendronate (FOSAMAX) 70 MG tablet Take 1 tablet (70 mg total) by mouth once a week. with a full glass of water on an empty stomach. 13 tablet 2  . atorvastatin (LIPITOR) 40 MG tablet Take 1 tablet (40 mg total) by mouth at bedtime. 90 tablet 2  . budesonide-formoterol (SYMBICORT) 160-4.5 MCG/ACT inhaler Inhale 2 puffs into  the lungs 2 (two) times daily. 1 Inhaler 0  . carvedilol (COREG) 25 MG tablet Take 1 tablet (25 mg total) by mouth 2 (two) times daily. 180 tablet 1  . Cholecalciferol (VITAMIN D3) 400 units tablet Take 1 tablet (400 Units total) by mouth daily. 30 tablet 0  . diclofenac sodium (VOLTAREN) 1 % GEL Apply 4 g topically 4 (four) times daily. 500 g 3  . furosemide (LASIX) 20 MG tablet Take 1 tablet (20 mg total) by mouth daily as needed for fluid or edema. 90 tablet 1  . methimazole (TAPAZOLE) 5 MG tablet Take 3 tablets (15 mg total) by mouth every evening. 90 tablet 1  . ranitidine (ZANTAC) 150 MG tablet Take 1 tablet (150 mg total) by mouth 2 (two) times daily. 180 tablet 3  . senna-docusate (SENOKOT-S) 8.6-50 MG tablet Take 1 tablet by mouth 2 (two) times daily.     Marland Kitchen warfarin (COUMADIN) 3 MG tablet Take 1 tablet (3 mg total) by mouth daily at 6 PM. 30 tablet 2  . losartan (COZAAR) 25 MG tablet Take 1 tablet (25 mg total) by mouth daily. 90 tablet 1   No current facility-administered medications on file prior to visit.     Past Medical History:  Diagnosis Date  . Allergy   . Aortic stenosis    Status post bioprosthetic AVR  . Arthritis    DJD  . Asthma   . Chronic systolic CHF (congestive heart failure) (HCC)    EF 30% 04/2014  . Coronary artery disease   . Gastritis   . Hyperlipidemia   . Hypertension   . Hyperthyroidism   . Multiple thyroid nodules   . Osteoporosis   . Paroxysmal atrial fibrillation (HCC)   . Pelvis fracture ([place]) 08/19/2015   MULTIPLE   . Spontaneous pneumothorax 11/28/2015   left   . Stroke [facility])    Allergies  Allergen Reactions  . Lisinopril Cough  . Tetanus Toxoid Adsorbed Swelling    Unknown     Social History   Social History  . Marital status: Married    Spouse name: Sherwood  . Number of children: 0  . Years of education: N/A   Occupational History  . Retired in 2004    Social History Main Topics  . Smoking status: Former Smoker     Packs/day: 0.25    Years: 10.00    Types: Cigarettes    Quit date: 03/01/2010  . Smokeless tobacco: Never Used  . Alcohol use No  . Drug use: No  . Sexual activity: Not Asked   Other Topics Concern  . None   Social History Narrative   Lives with husband.  Ambulated independently.    Vitals:   07/22/16 1522  BP: 136/80  Pulse: 82  Resp: 12  O2 sat at RA 96%. Body mass index is 32.89 kg/m.  Physical Exam  Nursing note and vitals reviewed. Constitutional: She is oriented to person, place, and time. She appears well-developed. No distress.  HENT:  Head: Atraumatic.  Mouth/Throat: Oropharynx is clear and moist and mucous membranes are normal.  Eyes: Conjunctivae and EOM are normal. Pupils  are equal, round, and reactive to light.  Cardiovascular: Normal rate and regular rhythm.   No murmur heard. Pulses:      Dorsalis pedis pulses are 2+ on the right side, and 2+ on the left side.  Respiratory: Effort normal and breath sounds normal. No respiratory distress.  GI: Soft. There is no tenderness.  Musculoskeletal: She exhibits no edema.       Right shoulder: She exhibits decreased range of motion and tenderness.  Right shoulder: No deformity, edema, or erythema appreciated. Luan Pulling' test pos, drop arm rotator cuff test pos, empty can supraspinatus test pos, cross body adduction test elicits pain, lift-Off Subscapularis test limited,by ROM. ROM marked limited, active and passive.  Lymphadenopathy:    She has no cervical adenopathy.  Neurological: She is alert and oriented to person, place, and time.  No focal deficit appreciated. She is in a wheel chair  Skin: Skin is warm. Burn noted. No ecchymosis noted. There is erythema.     Oval erythematous , superficial excoriation, 4-5 cm x 2 cm. No induration or local heat.  Psychiatric: Her mood appears anxious.  Well groomed, good eye contact.      ASSESSMENT AND PLAN:  Lauralynn was seen today for follow-up.  Diagnoses and  all orders for this visit:  Right shoulder injury, initial encounter  ? Rotator cuff strain vs partial tear among other possible injuries. Plain imaging ordered today. Will add shoulder PT to her current home PT program. If not improve, she may need MRI done and/or ortho evaluation.  -     DG Shoulder Right; Future  Leg burn, left, second degree, initial encounter  No signs of infection ans healing well. Keep area clean with soap and water. Keep it uncovered during the day. F/U as needed.   Generalized osteoarthritis of multiple sites  Otherwise stable. She feels like Hydrocodone is helping, recommend taking it bid as needed. Medication contract signed. Increase Cymbalta from 30 mg to 60 mg. If she doe snot noted any improvement in 8 weeks, will wean off. Some side effects of both medications discussed today. F/U in 2 months.  -     DULoxetine (CYMBALTA) 60 MG capsule; Take 1 capsule (60 mg total) by mouth daily. -     HYDROcodone-acetaminophen (NORCO/VICODIN) 5-325 MG tablet; Take 1 tablet by mouth every 8 (eight) hours as needed for moderate pain. No more than 70 tabs per month.  Chronic anticoagulation  According to pt, indication for anticoagulation is her Hx of CVA. She also has Hx of atrial fib. We dicussed the importance of dietary compliance, so if she cannot quantify amount of greens she eats it would be better not to eat them at all.  Fall, accidental, initial encounter  Fall precautions discussed. "Balance" issues is a risk factor for falls, she is not falling as frequent as she did but this may be because now she is in her wheel chair most of the time. We discussed some side effects of medications, some could increase the risk of falls.   -     DG Shoulder Right; Future    -[name] was advised to return sooner than planned today if new concerns arise.       Loranzo Desha G. [name], MD  [facility]. [place]  office.

## 2016-07-22 ENCOUNTER — Encounter: Payer: Self-pay | Admitting: Family Medicine

## 2016-07-22 ENCOUNTER — Ambulatory Visit (INDEPENDENT_AMBULATORY_CARE_PROVIDER_SITE_OTHER): Payer: Medicare Other | Admitting: Family Medicine

## 2016-07-22 ENCOUNTER — Ambulatory Visit (INDEPENDENT_AMBULATORY_CARE_PROVIDER_SITE_OTHER)
Admission: RE | Admit: 2016-07-22 | Discharge: 2016-07-22 | Disposition: A | Discharge status: Home / Self Care | Payer: Medicare Other | Source: Ambulatory Visit | Attending: Family Medicine | Admitting: Family Medicine

## 2016-07-22 VITALS — BP 136/80 | HR 82 | Resp 12 | Ht 67.0 in | Wt 210.0 lb

## 2016-07-22 DIAGNOSIS — S4991XA Unspecified injury of right shoulder and upper arm, initial encounter: Secondary | ICD-10-CM | POA: Diagnosis not present

## 2016-07-22 DIAGNOSIS — W19XXXA Unspecified fall, initial encounter: Secondary | ICD-10-CM

## 2016-07-22 DIAGNOSIS — Z7901 Long term (current) use of anticoagulants: Secondary | ICD-10-CM

## 2016-07-22 DIAGNOSIS — M19011 Primary osteoarthritis, right shoulder: Secondary | ICD-10-CM | POA: Diagnosis not present

## 2016-07-22 DIAGNOSIS — T24202A Burn of second degree of unspecified site of left lower limb, except ankle and foot, initial encounter: Secondary | ICD-10-CM

## 2016-07-22 DIAGNOSIS — M159 Polyosteoarthritis, unspecified: Secondary | ICD-10-CM

## 2016-07-22 MED ORDER — DULOXETINE HCL 60 MG PO CPEP: 60.0000 mg | ORAL_CAPSULE | Freq: Every day | ORAL | 2 refills | Status: DC

## 2016-07-22 MED ORDER — HYDROCODONE-ACETAMINOPHEN 5-325 MG PO TABS: 1.0000 | ORAL_TABLET | Freq: Three times a day (TID) | ORAL | 0 refills | Status: DC | PRN

## 2016-07-22 NOTE — Patient Instructions (Signed)
A few things to remember from today's visit:   Right shoulder injury, initial encounter - Plan: DG Shoulder Right  Generalized osteoarthritis of multiple sites - Plan: DULoxetine (CYMBALTA) 60 MG capsule, HYDROcodone-acetaminophen (NORCO/VICODIN) 5-325 MG tablet  Chronic anticoagulation  Fall, accidental, initial encounter - Plan: DG Shoulder Right  Cymbalta increased to 60 mg.  No changes in rest.    Please be sure medication list is accurate. If a new problem present, please set up appointment sooner than planned today.

## 2016-07-23 DIAGNOSIS — Z9181 History of falling: Secondary | ICD-10-CM | POA: Diagnosis not present

## 2016-07-23 DIAGNOSIS — I480 Paroxysmal atrial fibrillation: Secondary | ICD-10-CM | POA: Diagnosis not present

## 2016-07-23 DIAGNOSIS — E039 Hypothyroidism, unspecified: Secondary | ICD-10-CM | POA: Diagnosis not present

## 2016-07-23 DIAGNOSIS — I2510 Atherosclerotic heart disease of native coronary artery without angina pectoris: Secondary | ICD-10-CM | POA: Diagnosis not present

## 2016-07-23 DIAGNOSIS — J449 Chronic obstructive pulmonary disease, unspecified: Secondary | ICD-10-CM | POA: Diagnosis not present

## 2016-07-23 DIAGNOSIS — Z5181 Encounter for therapeutic drug level monitoring: Secondary | ICD-10-CM | POA: Diagnosis not present

## 2016-07-23 DIAGNOSIS — M810 Age-related osteoporosis without current pathological fracture: Secondary | ICD-10-CM | POA: Diagnosis not present

## 2016-07-23 DIAGNOSIS — D509 Iron deficiency anemia, unspecified: Secondary | ICD-10-CM | POA: Diagnosis not present

## 2016-07-23 DIAGNOSIS — Z7901 Long term (current) use of anticoagulants: Secondary | ICD-10-CM | POA: Diagnosis not present

## 2016-07-23 DIAGNOSIS — R339 Retention of urine, unspecified: Secondary | ICD-10-CM | POA: Diagnosis not present

## 2016-07-23 DIAGNOSIS — I509 Heart failure, unspecified: Secondary | ICD-10-CM | POA: Diagnosis not present

## 2016-07-23 DIAGNOSIS — M25551 Pain in right hip: Secondary | ICD-10-CM | POA: Diagnosis not present

## 2016-07-25 ENCOUNTER — Encounter: Payer: Self-pay | Admitting: Family Medicine

## 2016-07-26 DIAGNOSIS — Z9181 History of falling: Secondary | ICD-10-CM | POA: Diagnosis not present

## 2016-07-26 DIAGNOSIS — I480 Paroxysmal atrial fibrillation: Secondary | ICD-10-CM | POA: Diagnosis not present

## 2016-07-26 DIAGNOSIS — I509 Heart failure, unspecified: Secondary | ICD-10-CM | POA: Diagnosis not present

## 2016-07-26 DIAGNOSIS — M810 Age-related osteoporosis without current pathological fracture: Secondary | ICD-10-CM | POA: Diagnosis not present

## 2016-07-26 DIAGNOSIS — M25551 Pain in right hip: Secondary | ICD-10-CM | POA: Diagnosis not present

## 2016-07-26 DIAGNOSIS — J449 Chronic obstructive pulmonary disease, unspecified: Secondary | ICD-10-CM | POA: Diagnosis not present

## 2016-07-26 DIAGNOSIS — Z5181 Encounter for therapeutic drug level monitoring: Secondary | ICD-10-CM | POA: Diagnosis not present

## 2016-07-26 DIAGNOSIS — Z7901 Long term (current) use of anticoagulants: Secondary | ICD-10-CM | POA: Diagnosis not present

## 2016-07-26 DIAGNOSIS — D509 Iron deficiency anemia, unspecified: Secondary | ICD-10-CM | POA: Diagnosis not present

## 2016-07-26 DIAGNOSIS — E039 Hypothyroidism, unspecified: Secondary | ICD-10-CM | POA: Diagnosis not present

## 2016-07-26 DIAGNOSIS — R339 Retention of urine, unspecified: Secondary | ICD-10-CM | POA: Diagnosis not present

## 2016-07-26 DIAGNOSIS — I2510 Atherosclerotic heart disease of native coronary artery without angina pectoris: Secondary | ICD-10-CM | POA: Diagnosis not present

## 2016-07-27 DIAGNOSIS — Z7901 Long term (current) use of anticoagulants: Secondary | ICD-10-CM | POA: Diagnosis not present

## 2016-07-27 DIAGNOSIS — Z9181 History of falling: Secondary | ICD-10-CM | POA: Diagnosis not present

## 2016-07-27 DIAGNOSIS — M25551 Pain in right hip: Secondary | ICD-10-CM | POA: Diagnosis not present

## 2016-07-27 DIAGNOSIS — I509 Heart failure, unspecified: Secondary | ICD-10-CM | POA: Diagnosis not present

## 2016-07-27 DIAGNOSIS — E039 Hypothyroidism, unspecified: Secondary | ICD-10-CM | POA: Diagnosis not present

## 2016-07-27 DIAGNOSIS — R339 Retention of urine, unspecified: Secondary | ICD-10-CM | POA: Diagnosis not present

## 2016-07-27 DIAGNOSIS — Z5181 Encounter for therapeutic drug level monitoring: Secondary | ICD-10-CM | POA: Diagnosis not present

## 2016-07-27 DIAGNOSIS — I480 Paroxysmal atrial fibrillation: Secondary | ICD-10-CM | POA: Diagnosis not present

## 2016-07-27 DIAGNOSIS — M810 Age-related osteoporosis without current pathological fracture: Secondary | ICD-10-CM | POA: Diagnosis not present

## 2016-07-27 DIAGNOSIS — J449 Chronic obstructive pulmonary disease, unspecified: Secondary | ICD-10-CM | POA: Diagnosis not present

## 2016-07-27 DIAGNOSIS — I2510 Atherosclerotic heart disease of native coronary artery without angina pectoris: Secondary | ICD-10-CM | POA: Diagnosis not present

## 2016-07-27 DIAGNOSIS — D509 Iron deficiency anemia, unspecified: Secondary | ICD-10-CM | POA: Diagnosis not present

## 2016-07-29 DIAGNOSIS — Z5181 Encounter for therapeutic drug level monitoring: Secondary | ICD-10-CM | POA: Diagnosis not present

## 2016-07-29 DIAGNOSIS — I509 Heart failure, unspecified: Secondary | ICD-10-CM | POA: Diagnosis not present

## 2016-07-29 DIAGNOSIS — M25551 Pain in right hip: Secondary | ICD-10-CM | POA: Diagnosis not present

## 2016-07-29 DIAGNOSIS — I480 Paroxysmal atrial fibrillation: Secondary | ICD-10-CM | POA: Diagnosis not present

## 2016-07-29 DIAGNOSIS — M810 Age-related osteoporosis without current pathological fracture: Secondary | ICD-10-CM | POA: Diagnosis not present

## 2016-07-29 DIAGNOSIS — Z9181 History of falling: Secondary | ICD-10-CM | POA: Diagnosis not present

## 2016-07-29 DIAGNOSIS — I2510 Atherosclerotic heart disease of native coronary artery without angina pectoris: Secondary | ICD-10-CM | POA: Diagnosis not present

## 2016-07-29 DIAGNOSIS — R339 Retention of urine, unspecified: Secondary | ICD-10-CM | POA: Diagnosis not present

## 2016-07-29 DIAGNOSIS — D509 Iron deficiency anemia, unspecified: Secondary | ICD-10-CM | POA: Diagnosis not present

## 2016-07-29 DIAGNOSIS — J449 Chronic obstructive pulmonary disease, unspecified: Secondary | ICD-10-CM | POA: Diagnosis not present

## 2016-07-29 DIAGNOSIS — E039 Hypothyroidism, unspecified: Secondary | ICD-10-CM | POA: Diagnosis not present

## 2016-07-29 DIAGNOSIS — Z7901 Long term (current) use of anticoagulants: Secondary | ICD-10-CM | POA: Diagnosis not present

## 2016-07-31 DIAGNOSIS — J45909 Unspecified asthma, uncomplicated: Secondary | ICD-10-CM | POA: Diagnosis not present

## 2016-07-31 DIAGNOSIS — R269 Unspecified abnormalities of gait and mobility: Secondary | ICD-10-CM | POA: Diagnosis not present

## 2016-08-03 ENCOUNTER — Telehealth: Payer: Self-pay | Admitting: Family Medicine

## 2016-08-03 DIAGNOSIS — I509 Heart failure, unspecified: Secondary | ICD-10-CM | POA: Diagnosis not present

## 2016-08-03 DIAGNOSIS — M810 Age-related osteoporosis without current pathological fracture: Secondary | ICD-10-CM | POA: Diagnosis not present

## 2016-08-03 DIAGNOSIS — D509 Iron deficiency anemia, unspecified: Secondary | ICD-10-CM | POA: Diagnosis not present

## 2016-08-03 DIAGNOSIS — Z9181 History of falling: Secondary | ICD-10-CM | POA: Diagnosis not present

## 2016-08-03 DIAGNOSIS — R339 Retention of urine, unspecified: Secondary | ICD-10-CM | POA: Diagnosis not present

## 2016-08-03 DIAGNOSIS — J449 Chronic obstructive pulmonary disease, unspecified: Secondary | ICD-10-CM | POA: Diagnosis not present

## 2016-08-03 DIAGNOSIS — E039 Hypothyroidism, unspecified: Secondary | ICD-10-CM | POA: Diagnosis not present

## 2016-08-03 DIAGNOSIS — I2510 Atherosclerotic heart disease of native coronary artery without angina pectoris: Secondary | ICD-10-CM | POA: Diagnosis not present

## 2016-08-03 DIAGNOSIS — Z5181 Encounter for therapeutic drug level monitoring: Secondary | ICD-10-CM | POA: Diagnosis not present

## 2016-08-03 DIAGNOSIS — Z7901 Long term (current) use of anticoagulants: Secondary | ICD-10-CM | POA: Diagnosis not present

## 2016-08-03 DIAGNOSIS — I480 Paroxysmal atrial fibrillation: Secondary | ICD-10-CM | POA: Diagnosis not present

## 2016-08-03 DIAGNOSIS — M25551 Pain in right hip: Secondary | ICD-10-CM | POA: Diagnosis not present

## 2016-08-03 NOTE — Telephone Encounter (Signed)
Referral to ortho can be arranged for shoulder pain. I thought she was already following with ortho.  Thanks, BJ

## 2016-08-03 NOTE — Telephone Encounter (Signed)
Called and spoke with Tillie Rung. Patient has been seen at [place] in February. They will call to get an appointment.

## 2016-08-03 NOTE — Telephone Encounter (Signed)
Tillie Rung, a physical therapist, with [facility] home health, called to let Dr. [name] know the pt fell a couple weeks ago and hurt right shoulder.  Shoulder is still painful and ROM is still very limited.  Wants to know if MRI is warranted or a referral to an orthopedic doctor would be recommended.  Tillie Rung is concerned about pt getting a frozen shoulder.  Please contact Tillie Rung at [phone]

## 2016-08-03 NOTE — Telephone Encounter (Signed)
Please advise 

## 2016-08-04 DIAGNOSIS — R339 Retention of urine, unspecified: Secondary | ICD-10-CM | POA: Diagnosis not present

## 2016-08-04 DIAGNOSIS — J449 Chronic obstructive pulmonary disease, unspecified: Secondary | ICD-10-CM | POA: Diagnosis not present

## 2016-08-04 DIAGNOSIS — I480 Paroxysmal atrial fibrillation: Secondary | ICD-10-CM | POA: Diagnosis not present

## 2016-08-04 DIAGNOSIS — I2510 Atherosclerotic heart disease of native coronary artery without angina pectoris: Secondary | ICD-10-CM | POA: Diagnosis not present

## 2016-08-04 DIAGNOSIS — M810 Age-related osteoporosis without current pathological fracture: Secondary | ICD-10-CM | POA: Diagnosis not present

## 2016-08-04 DIAGNOSIS — E039 Hypothyroidism, unspecified: Secondary | ICD-10-CM | POA: Diagnosis not present

## 2016-08-04 DIAGNOSIS — Z7901 Long term (current) use of anticoagulants: Secondary | ICD-10-CM | POA: Diagnosis not present

## 2016-08-04 DIAGNOSIS — Z5181 Encounter for therapeutic drug level monitoring: Secondary | ICD-10-CM | POA: Diagnosis not present

## 2016-08-04 DIAGNOSIS — I509 Heart failure, unspecified: Secondary | ICD-10-CM | POA: Diagnosis not present

## 2016-08-04 DIAGNOSIS — Z9181 History of falling: Secondary | ICD-10-CM | POA: Diagnosis not present

## 2016-08-04 DIAGNOSIS — M25551 Pain in right hip: Secondary | ICD-10-CM | POA: Diagnosis not present

## 2016-08-04 DIAGNOSIS — D509 Iron deficiency anemia, unspecified: Secondary | ICD-10-CM | POA: Diagnosis not present

## 2016-08-09 DIAGNOSIS — D509 Iron deficiency anemia, unspecified: Secondary | ICD-10-CM | POA: Diagnosis not present

## 2016-08-09 DIAGNOSIS — Z5181 Encounter for therapeutic drug level monitoring: Secondary | ICD-10-CM | POA: Diagnosis not present

## 2016-08-09 DIAGNOSIS — I480 Paroxysmal atrial fibrillation: Secondary | ICD-10-CM | POA: Diagnosis not present

## 2016-08-09 DIAGNOSIS — M810 Age-related osteoporosis without current pathological fracture: Secondary | ICD-10-CM | POA: Diagnosis not present

## 2016-08-09 DIAGNOSIS — I2510 Atherosclerotic heart disease of native coronary artery without angina pectoris: Secondary | ICD-10-CM | POA: Diagnosis not present

## 2016-08-09 DIAGNOSIS — I509 Heart failure, unspecified: Secondary | ICD-10-CM | POA: Diagnosis not present

## 2016-08-09 DIAGNOSIS — J449 Chronic obstructive pulmonary disease, unspecified: Secondary | ICD-10-CM | POA: Diagnosis not present

## 2016-08-09 DIAGNOSIS — R339 Retention of urine, unspecified: Secondary | ICD-10-CM | POA: Diagnosis not present

## 2016-08-09 DIAGNOSIS — E039 Hypothyroidism, unspecified: Secondary | ICD-10-CM | POA: Diagnosis not present

## 2016-08-09 DIAGNOSIS — M25551 Pain in right hip: Secondary | ICD-10-CM | POA: Diagnosis not present

## 2016-08-09 DIAGNOSIS — Z7901 Long term (current) use of anticoagulants: Secondary | ICD-10-CM | POA: Diagnosis not present

## 2016-08-09 DIAGNOSIS — Z9181 History of falling: Secondary | ICD-10-CM | POA: Diagnosis not present

## 2016-08-11 ENCOUNTER — Ambulatory Visit (INDEPENDENT_AMBULATORY_CARE_PROVIDER_SITE_OTHER): Payer: Medicare Other | Admitting: General Practice

## 2016-08-11 DIAGNOSIS — Z9181 History of falling: Secondary | ICD-10-CM | POA: Diagnosis not present

## 2016-08-11 DIAGNOSIS — M810 Age-related osteoporosis without current pathological fracture: Secondary | ICD-10-CM | POA: Diagnosis not present

## 2016-08-11 DIAGNOSIS — I2510 Atherosclerotic heart disease of native coronary artery without angina pectoris: Secondary | ICD-10-CM | POA: Diagnosis not present

## 2016-08-11 DIAGNOSIS — I509 Heart failure, unspecified: Secondary | ICD-10-CM | POA: Diagnosis not present

## 2016-08-11 DIAGNOSIS — D509 Iron deficiency anemia, unspecified: Secondary | ICD-10-CM | POA: Diagnosis not present

## 2016-08-11 DIAGNOSIS — R339 Retention of urine, unspecified: Secondary | ICD-10-CM | POA: Diagnosis not present

## 2016-08-11 DIAGNOSIS — J449 Chronic obstructive pulmonary disease, unspecified: Secondary | ICD-10-CM | POA: Diagnosis not present

## 2016-08-11 DIAGNOSIS — Z5181 Encounter for therapeutic drug level monitoring: Secondary | ICD-10-CM | POA: Diagnosis not present

## 2016-08-11 DIAGNOSIS — M25551 Pain in right hip: Secondary | ICD-10-CM | POA: Diagnosis not present

## 2016-08-11 DIAGNOSIS — Z7901 Long term (current) use of anticoagulants: Secondary | ICD-10-CM

## 2016-08-11 DIAGNOSIS — E039 Hypothyroidism, unspecified: Secondary | ICD-10-CM | POA: Diagnosis not present

## 2016-08-11 DIAGNOSIS — I480 Paroxysmal atrial fibrillation: Secondary | ICD-10-CM | POA: Diagnosis not present

## 2016-08-11 LAB — POCT INR: INR: 2.2

## 2016-08-11 NOTE — Patient Instructions (Signed)
Pre visit review using our clinic review tool, if applicable. No additional management support is needed unless otherwise documented below in the visit note. 

## 2016-08-16 DIAGNOSIS — Z7901 Long term (current) use of anticoagulants: Secondary | ICD-10-CM | POA: Diagnosis not present

## 2016-08-16 DIAGNOSIS — M810 Age-related osteoporosis without current pathological fracture: Secondary | ICD-10-CM | POA: Diagnosis not present

## 2016-08-16 DIAGNOSIS — J449 Chronic obstructive pulmonary disease, unspecified: Secondary | ICD-10-CM | POA: Diagnosis not present

## 2016-08-16 DIAGNOSIS — I2510 Atherosclerotic heart disease of native coronary artery without angina pectoris: Secondary | ICD-10-CM | POA: Diagnosis not present

## 2016-08-16 DIAGNOSIS — I480 Paroxysmal atrial fibrillation: Secondary | ICD-10-CM | POA: Diagnosis not present

## 2016-08-16 DIAGNOSIS — D509 Iron deficiency anemia, unspecified: Secondary | ICD-10-CM | POA: Diagnosis not present

## 2016-08-16 DIAGNOSIS — Z9181 History of falling: Secondary | ICD-10-CM | POA: Diagnosis not present

## 2016-08-16 DIAGNOSIS — I509 Heart failure, unspecified: Secondary | ICD-10-CM | POA: Diagnosis not present

## 2016-08-16 DIAGNOSIS — Z5181 Encounter for therapeutic drug level monitoring: Secondary | ICD-10-CM | POA: Diagnosis not present

## 2016-08-16 DIAGNOSIS — M25551 Pain in right hip: Secondary | ICD-10-CM | POA: Diagnosis not present

## 2016-08-16 DIAGNOSIS — E039 Hypothyroidism, unspecified: Secondary | ICD-10-CM | POA: Diagnosis not present

## 2016-08-16 DIAGNOSIS — R339 Retention of urine, unspecified: Secondary | ICD-10-CM | POA: Diagnosis not present

## 2016-08-21 DIAGNOSIS — D509 Iron deficiency anemia, unspecified: Secondary | ICD-10-CM | POA: Diagnosis not present

## 2016-08-21 DIAGNOSIS — I2510 Atherosclerotic heart disease of native coronary artery without angina pectoris: Secondary | ICD-10-CM | POA: Diagnosis not present

## 2016-08-21 DIAGNOSIS — M25551 Pain in right hip: Secondary | ICD-10-CM | POA: Diagnosis not present

## 2016-08-21 DIAGNOSIS — Z9181 History of falling: Secondary | ICD-10-CM | POA: Diagnosis not present

## 2016-08-21 DIAGNOSIS — I480 Paroxysmal atrial fibrillation: Secondary | ICD-10-CM | POA: Diagnosis not present

## 2016-08-21 DIAGNOSIS — I509 Heart failure, unspecified: Secondary | ICD-10-CM | POA: Diagnosis not present

## 2016-08-21 DIAGNOSIS — J449 Chronic obstructive pulmonary disease, unspecified: Secondary | ICD-10-CM | POA: Diagnosis not present

## 2016-08-21 DIAGNOSIS — M810 Age-related osteoporosis without current pathological fracture: Secondary | ICD-10-CM | POA: Diagnosis not present

## 2016-08-21 DIAGNOSIS — E039 Hypothyroidism, unspecified: Secondary | ICD-10-CM | POA: Diagnosis not present

## 2016-08-21 DIAGNOSIS — Z7901 Long term (current) use of anticoagulants: Secondary | ICD-10-CM | POA: Diagnosis not present

## 2016-08-21 DIAGNOSIS — R339 Retention of urine, unspecified: Secondary | ICD-10-CM | POA: Diagnosis not present

## 2016-08-21 DIAGNOSIS — Z5181 Encounter for therapeutic drug level monitoring: Secondary | ICD-10-CM | POA: Diagnosis not present

## 2016-08-23 ENCOUNTER — Telehealth: Payer: Self-pay | Admitting: Family Medicine

## 2016-08-23 DIAGNOSIS — R339 Retention of urine, unspecified: Secondary | ICD-10-CM | POA: Diagnosis not present

## 2016-08-23 DIAGNOSIS — J449 Chronic obstructive pulmonary disease, unspecified: Secondary | ICD-10-CM | POA: Diagnosis not present

## 2016-08-23 DIAGNOSIS — E039 Hypothyroidism, unspecified: Secondary | ICD-10-CM | POA: Diagnosis not present

## 2016-08-23 DIAGNOSIS — I480 Paroxysmal atrial fibrillation: Secondary | ICD-10-CM | POA: Diagnosis not present

## 2016-08-23 DIAGNOSIS — I509 Heart failure, unspecified: Secondary | ICD-10-CM | POA: Diagnosis not present

## 2016-08-23 DIAGNOSIS — I2510 Atherosclerotic heart disease of native coronary artery without angina pectoris: Secondary | ICD-10-CM | POA: Diagnosis not present

## 2016-08-23 DIAGNOSIS — Z7901 Long term (current) use of anticoagulants: Secondary | ICD-10-CM | POA: Diagnosis not present

## 2016-08-23 DIAGNOSIS — M810 Age-related osteoporosis without current pathological fracture: Secondary | ICD-10-CM | POA: Diagnosis not present

## 2016-08-23 DIAGNOSIS — Z5181 Encounter for therapeutic drug level monitoring: Secondary | ICD-10-CM | POA: Diagnosis not present

## 2016-08-23 DIAGNOSIS — M25551 Pain in right hip: Secondary | ICD-10-CM | POA: Diagnosis not present

## 2016-08-23 DIAGNOSIS — D509 Iron deficiency anemia, unspecified: Secondary | ICD-10-CM | POA: Diagnosis not present

## 2016-08-23 DIAGNOSIS — Z9181 History of falling: Secondary | ICD-10-CM | POA: Diagnosis not present

## 2016-08-23 NOTE — Telephone Encounter (Signed)
Pt would like to have a call back today on a personal matter.

## 2016-08-23 NOTE — Telephone Encounter (Signed)
Left voicemail for patient to call the office back.   

## 2016-08-24 NOTE — Telephone Encounter (Signed)
I will have to fax an order over, how long would you like the PT extended for?

## 2016-08-24 NOTE — Telephone Encounter (Signed)
The time that physiotherapist considers necessary based on evaluation and further benefit. So PT needs to let us know if she would benefit from further therapy and how long they estimate is it needed at this point.  Thanks, BJ

## 2016-08-24 NOTE — Telephone Encounter (Signed)
If still beneficial for her and documented by physico therapist I am not opposed to continue home PT. Otherwise outpt PT could be arranged.  Thanks, BJ

## 2016-08-24 NOTE — Telephone Encounter (Signed)
I called and spoke with patient. She is wanting to know if we extend her PT.  Please advise & I will notify [facility] with the decision.

## 2016-08-25 ENCOUNTER — Ambulatory Visit (INDEPENDENT_AMBULATORY_CARE_PROVIDER_SITE_OTHER): Payer: Medicare Other | Admitting: General Practice

## 2016-08-25 DIAGNOSIS — Z5181 Encounter for therapeutic drug level monitoring: Secondary | ICD-10-CM

## 2016-08-25 DIAGNOSIS — Z7901 Long term (current) use of anticoagulants: Secondary | ICD-10-CM

## 2016-08-25 LAB — POCT INR: INR: 1.9

## 2016-08-25 NOTE — Telephone Encounter (Signed)
Left voicemail for Danielle [name] with [facility] to call me back to see how long they would like to extend it.

## 2016-08-25 NOTE — Patient Instructions (Signed)
Pre visit review using our clinic review tool, if applicable. No additional management support is needed unless otherwise documented below in the visit note. 

## 2016-08-27 DIAGNOSIS — Z9181 History of falling: Secondary | ICD-10-CM | POA: Diagnosis not present

## 2016-08-27 DIAGNOSIS — M25551 Pain in right hip: Secondary | ICD-10-CM | POA: Diagnosis not present

## 2016-08-27 DIAGNOSIS — R339 Retention of urine, unspecified: Secondary | ICD-10-CM | POA: Diagnosis not present

## 2016-08-27 DIAGNOSIS — I509 Heart failure, unspecified: Secondary | ICD-10-CM | POA: Diagnosis not present

## 2016-08-27 DIAGNOSIS — I480 Paroxysmal atrial fibrillation: Secondary | ICD-10-CM | POA: Diagnosis not present

## 2016-08-27 DIAGNOSIS — J449 Chronic obstructive pulmonary disease, unspecified: Secondary | ICD-10-CM | POA: Diagnosis not present

## 2016-08-27 DIAGNOSIS — Z5181 Encounter for therapeutic drug level monitoring: Secondary | ICD-10-CM | POA: Diagnosis not present

## 2016-08-27 DIAGNOSIS — I2510 Atherosclerotic heart disease of native coronary artery without angina pectoris: Secondary | ICD-10-CM | POA: Diagnosis not present

## 2016-08-27 DIAGNOSIS — E039 Hypothyroidism, unspecified: Secondary | ICD-10-CM | POA: Diagnosis not present

## 2016-08-27 DIAGNOSIS — Z7901 Long term (current) use of anticoagulants: Secondary | ICD-10-CM | POA: Diagnosis not present

## 2016-08-27 DIAGNOSIS — D509 Iron deficiency anemia, unspecified: Secondary | ICD-10-CM | POA: Diagnosis not present

## 2016-08-27 DIAGNOSIS — M810 Age-related osteoporosis without current pathological fracture: Secondary | ICD-10-CM | POA: Diagnosis not present

## 2016-08-30 DIAGNOSIS — Z5181 Encounter for therapeutic drug level monitoring: Secondary | ICD-10-CM | POA: Diagnosis not present

## 2016-08-30 DIAGNOSIS — Z7901 Long term (current) use of anticoagulants: Secondary | ICD-10-CM | POA: Diagnosis not present

## 2016-08-30 DIAGNOSIS — I509 Heart failure, unspecified: Secondary | ICD-10-CM | POA: Diagnosis not present

## 2016-08-30 DIAGNOSIS — R269 Unspecified abnormalities of gait and mobility: Secondary | ICD-10-CM | POA: Diagnosis not present

## 2016-08-30 DIAGNOSIS — I480 Paroxysmal atrial fibrillation: Secondary | ICD-10-CM | POA: Diagnosis not present

## 2016-08-30 DIAGNOSIS — I2510 Atherosclerotic heart disease of native coronary artery without angina pectoris: Secondary | ICD-10-CM | POA: Diagnosis not present

## 2016-08-30 DIAGNOSIS — E039 Hypothyroidism, unspecified: Secondary | ICD-10-CM | POA: Diagnosis not present

## 2016-08-30 DIAGNOSIS — Z9181 History of falling: Secondary | ICD-10-CM | POA: Diagnosis not present

## 2016-08-30 DIAGNOSIS — M810 Age-related osteoporosis without current pathological fracture: Secondary | ICD-10-CM | POA: Diagnosis not present

## 2016-08-30 DIAGNOSIS — J449 Chronic obstructive pulmonary disease, unspecified: Secondary | ICD-10-CM | POA: Diagnosis not present

## 2016-08-30 DIAGNOSIS — D509 Iron deficiency anemia, unspecified: Secondary | ICD-10-CM | POA: Diagnosis not present

## 2016-08-30 DIAGNOSIS — J45909 Unspecified asthma, uncomplicated: Secondary | ICD-10-CM | POA: Diagnosis not present

## 2016-08-30 DIAGNOSIS — M25551 Pain in right hip: Secondary | ICD-10-CM | POA: Diagnosis not present

## 2016-08-30 DIAGNOSIS — R339 Retention of urine, unspecified: Secondary | ICD-10-CM | POA: Diagnosis not present

## 2016-08-31 NOTE — Telephone Encounter (Signed)
Left voicemail for Tillie Rung to call back to see how long they would like to extend the PT.

## 2016-09-06 DIAGNOSIS — J449 Chronic obstructive pulmonary disease, unspecified: Secondary | ICD-10-CM | POA: Diagnosis not present

## 2016-09-06 DIAGNOSIS — D509 Iron deficiency anemia, unspecified: Secondary | ICD-10-CM | POA: Diagnosis not present

## 2016-09-06 DIAGNOSIS — I2510 Atherosclerotic heart disease of native coronary artery without angina pectoris: Secondary | ICD-10-CM | POA: Diagnosis not present

## 2016-09-06 DIAGNOSIS — M25552 Pain in left hip: Secondary | ICD-10-CM | POA: Diagnosis not present

## 2016-09-06 DIAGNOSIS — M25562 Pain in left knee: Secondary | ICD-10-CM | POA: Diagnosis not present

## 2016-09-06 DIAGNOSIS — M25511 Pain in right shoulder: Secondary | ICD-10-CM | POA: Diagnosis not present

## 2016-09-06 DIAGNOSIS — E039 Hypothyroidism, unspecified: Secondary | ICD-10-CM | POA: Diagnosis not present

## 2016-09-06 DIAGNOSIS — M810 Age-related osteoporosis without current pathological fracture: Secondary | ICD-10-CM | POA: Diagnosis not present

## 2016-09-06 DIAGNOSIS — I509 Heart failure, unspecified: Secondary | ICD-10-CM | POA: Diagnosis not present

## 2016-09-06 DIAGNOSIS — I480 Paroxysmal atrial fibrillation: Secondary | ICD-10-CM | POA: Diagnosis not present

## 2016-09-06 DIAGNOSIS — R339 Retention of urine, unspecified: Secondary | ICD-10-CM | POA: Diagnosis not present

## 2016-09-06 DIAGNOSIS — I110 Hypertensive heart disease with heart failure: Secondary | ICD-10-CM | POA: Diagnosis not present

## 2016-09-08 DIAGNOSIS — I480 Paroxysmal atrial fibrillation: Secondary | ICD-10-CM | POA: Diagnosis not present

## 2016-09-08 DIAGNOSIS — I110 Hypertensive heart disease with heart failure: Secondary | ICD-10-CM | POA: Diagnosis not present

## 2016-09-08 DIAGNOSIS — R339 Retention of urine, unspecified: Secondary | ICD-10-CM | POA: Diagnosis not present

## 2016-09-08 DIAGNOSIS — D509 Iron deficiency anemia, unspecified: Secondary | ICD-10-CM | POA: Diagnosis not present

## 2016-09-08 DIAGNOSIS — I509 Heart failure, unspecified: Secondary | ICD-10-CM | POA: Diagnosis not present

## 2016-09-08 DIAGNOSIS — E039 Hypothyroidism, unspecified: Secondary | ICD-10-CM | POA: Diagnosis not present

## 2016-09-08 DIAGNOSIS — M25511 Pain in right shoulder: Secondary | ICD-10-CM | POA: Diagnosis not present

## 2016-09-08 DIAGNOSIS — M25552 Pain in left hip: Secondary | ICD-10-CM | POA: Diagnosis not present

## 2016-09-08 DIAGNOSIS — M810 Age-related osteoporosis without current pathological fracture: Secondary | ICD-10-CM | POA: Diagnosis not present

## 2016-09-08 DIAGNOSIS — I2510 Atherosclerotic heart disease of native coronary artery without angina pectoris: Secondary | ICD-10-CM | POA: Diagnosis not present

## 2016-09-08 DIAGNOSIS — M25562 Pain in left knee: Secondary | ICD-10-CM | POA: Diagnosis not present

## 2016-09-08 DIAGNOSIS — J449 Chronic obstructive pulmonary disease, unspecified: Secondary | ICD-10-CM | POA: Diagnosis not present

## 2016-09-10 NOTE — Telephone Encounter (Signed)
Received papers to be signed for PT. Papers signed & mailed back.

## 2016-09-13 DIAGNOSIS — I2510 Atherosclerotic heart disease of native coronary artery without angina pectoris: Secondary | ICD-10-CM | POA: Diagnosis not present

## 2016-09-13 DIAGNOSIS — I110 Hypertensive heart disease with heart failure: Secondary | ICD-10-CM | POA: Diagnosis not present

## 2016-09-13 DIAGNOSIS — E039 Hypothyroidism, unspecified: Secondary | ICD-10-CM | POA: Diagnosis not present

## 2016-09-13 DIAGNOSIS — M25511 Pain in right shoulder: Secondary | ICD-10-CM | POA: Diagnosis not present

## 2016-09-13 DIAGNOSIS — I509 Heart failure, unspecified: Secondary | ICD-10-CM | POA: Diagnosis not present

## 2016-09-13 DIAGNOSIS — R339 Retention of urine, unspecified: Secondary | ICD-10-CM | POA: Diagnosis not present

## 2016-09-13 DIAGNOSIS — D509 Iron deficiency anemia, unspecified: Secondary | ICD-10-CM | POA: Diagnosis not present

## 2016-09-13 DIAGNOSIS — M25552 Pain in left hip: Secondary | ICD-10-CM | POA: Diagnosis not present

## 2016-09-13 DIAGNOSIS — M810 Age-related osteoporosis without current pathological fracture: Secondary | ICD-10-CM | POA: Diagnosis not present

## 2016-09-13 DIAGNOSIS — I480 Paroxysmal atrial fibrillation: Secondary | ICD-10-CM | POA: Diagnosis not present

## 2016-09-13 DIAGNOSIS — M25562 Pain in left knee: Secondary | ICD-10-CM | POA: Diagnosis not present

## 2016-09-13 DIAGNOSIS — J449 Chronic obstructive pulmonary disease, unspecified: Secondary | ICD-10-CM | POA: Diagnosis not present

## 2016-09-15 ENCOUNTER — Ambulatory Visit: Payer: Medicare Other

## 2016-09-15 DIAGNOSIS — M810 Age-related osteoporosis without current pathological fracture: Secondary | ICD-10-CM | POA: Diagnosis not present

## 2016-09-15 DIAGNOSIS — D509 Iron deficiency anemia, unspecified: Secondary | ICD-10-CM | POA: Diagnosis not present

## 2016-09-15 DIAGNOSIS — I110 Hypertensive heart disease with heart failure: Secondary | ICD-10-CM | POA: Diagnosis not present

## 2016-09-15 DIAGNOSIS — M25562 Pain in left knee: Secondary | ICD-10-CM | POA: Diagnosis not present

## 2016-09-15 DIAGNOSIS — E039 Hypothyroidism, unspecified: Secondary | ICD-10-CM | POA: Diagnosis not present

## 2016-09-15 DIAGNOSIS — M25552 Pain in left hip: Secondary | ICD-10-CM | POA: Diagnosis not present

## 2016-09-15 DIAGNOSIS — M25511 Pain in right shoulder: Secondary | ICD-10-CM | POA: Diagnosis not present

## 2016-09-15 DIAGNOSIS — J449 Chronic obstructive pulmonary disease, unspecified: Secondary | ICD-10-CM | POA: Diagnosis not present

## 2016-09-15 DIAGNOSIS — I509 Heart failure, unspecified: Secondary | ICD-10-CM | POA: Diagnosis not present

## 2016-09-15 DIAGNOSIS — I2510 Atherosclerotic heart disease of native coronary artery without angina pectoris: Secondary | ICD-10-CM | POA: Diagnosis not present

## 2016-09-15 DIAGNOSIS — R339 Retention of urine, unspecified: Secondary | ICD-10-CM | POA: Diagnosis not present

## 2016-09-15 DIAGNOSIS — I480 Paroxysmal atrial fibrillation: Secondary | ICD-10-CM | POA: Diagnosis not present

## 2016-09-17 ENCOUNTER — Ambulatory Visit (INDEPENDENT_AMBULATORY_CARE_PROVIDER_SITE_OTHER): Payer: Medicare Other | Admitting: General Practice

## 2016-09-17 DIAGNOSIS — Z7901 Long term (current) use of anticoagulants: Secondary | ICD-10-CM

## 2016-09-17 DIAGNOSIS — Z5181 Encounter for therapeutic drug level monitoring: Secondary | ICD-10-CM | POA: Diagnosis not present

## 2016-09-17 LAB — POCT INR
INR: 1.9
INR: 1.9

## 2016-09-17 NOTE — Progress Notes (Signed)
I have reviewed and agree with the plan. 

## 2016-09-17 NOTE — Patient Instructions (Signed)
Pre visit review using our clinic review tool, if applicable. No additional management support is needed unless otherwise documented below in the visit note. 

## 2016-09-21 DIAGNOSIS — M810 Age-related osteoporosis without current pathological fracture: Secondary | ICD-10-CM | POA: Diagnosis not present

## 2016-09-21 DIAGNOSIS — M25511 Pain in right shoulder: Secondary | ICD-10-CM | POA: Diagnosis not present

## 2016-09-21 DIAGNOSIS — M25562 Pain in left knee: Secondary | ICD-10-CM | POA: Diagnosis not present

## 2016-09-21 DIAGNOSIS — E039 Hypothyroidism, unspecified: Secondary | ICD-10-CM | POA: Diagnosis not present

## 2016-09-21 DIAGNOSIS — R339 Retention of urine, unspecified: Secondary | ICD-10-CM | POA: Diagnosis not present

## 2016-09-21 DIAGNOSIS — J449 Chronic obstructive pulmonary disease, unspecified: Secondary | ICD-10-CM | POA: Diagnosis not present

## 2016-09-21 DIAGNOSIS — M25552 Pain in left hip: Secondary | ICD-10-CM | POA: Diagnosis not present

## 2016-09-21 DIAGNOSIS — I509 Heart failure, unspecified: Secondary | ICD-10-CM | POA: Diagnosis not present

## 2016-09-21 DIAGNOSIS — I2510 Atherosclerotic heart disease of native coronary artery without angina pectoris: Secondary | ICD-10-CM | POA: Diagnosis not present

## 2016-09-21 DIAGNOSIS — D509 Iron deficiency anemia, unspecified: Secondary | ICD-10-CM | POA: Diagnosis not present

## 2016-09-21 DIAGNOSIS — I110 Hypertensive heart disease with heart failure: Secondary | ICD-10-CM | POA: Diagnosis not present

## 2016-09-21 DIAGNOSIS — I480 Paroxysmal atrial fibrillation: Secondary | ICD-10-CM | POA: Diagnosis not present

## 2016-09-22 ENCOUNTER — Other Ambulatory Visit: Payer: Self-pay

## 2016-09-22 ENCOUNTER — Telehealth: Payer: Self-pay | Admitting: Endocrinology

## 2016-09-22 MED ORDER — METHIMAZOLE 5 MG PO TABS: 15.0000 mg | ORAL_TABLET | Freq: Every evening | ORAL | 1 refills | Status: DC

## 2016-09-22 NOTE — Telephone Encounter (Signed)
**  Remind patient they can make refill requests via MyChart**  Medication refill request (Name & Dosage):  Methimazole (Tapazole) 5 mg tab  Preferred pharmacy (Name & Address):  [place], [place] -[address]  Other comments (if applicable):

## 2016-09-23 DIAGNOSIS — D509 Iron deficiency anemia, unspecified: Secondary | ICD-10-CM | POA: Diagnosis not present

## 2016-09-23 DIAGNOSIS — I480 Paroxysmal atrial fibrillation: Secondary | ICD-10-CM | POA: Diagnosis not present

## 2016-09-23 DIAGNOSIS — M810 Age-related osteoporosis without current pathological fracture: Secondary | ICD-10-CM | POA: Diagnosis not present

## 2016-09-23 DIAGNOSIS — I509 Heart failure, unspecified: Secondary | ICD-10-CM | POA: Diagnosis not present

## 2016-09-23 DIAGNOSIS — J449 Chronic obstructive pulmonary disease, unspecified: Secondary | ICD-10-CM | POA: Diagnosis not present

## 2016-09-23 DIAGNOSIS — M25562 Pain in left knee: Secondary | ICD-10-CM | POA: Diagnosis not present

## 2016-09-23 DIAGNOSIS — M25511 Pain in right shoulder: Secondary | ICD-10-CM | POA: Diagnosis not present

## 2016-09-23 DIAGNOSIS — R339 Retention of urine, unspecified: Secondary | ICD-10-CM | POA: Diagnosis not present

## 2016-09-23 DIAGNOSIS — M25552 Pain in left hip: Secondary | ICD-10-CM | POA: Diagnosis not present

## 2016-09-23 DIAGNOSIS — I2510 Atherosclerotic heart disease of native coronary artery without angina pectoris: Secondary | ICD-10-CM | POA: Diagnosis not present

## 2016-09-23 DIAGNOSIS — I110 Hypertensive heart disease with heart failure: Secondary | ICD-10-CM | POA: Diagnosis not present

## 2016-09-23 DIAGNOSIS — E039 Hypothyroidism, unspecified: Secondary | ICD-10-CM | POA: Diagnosis not present

## 2016-09-27 ENCOUNTER — Other Ambulatory Visit: Payer: Self-pay | Admitting: Family Medicine

## 2016-09-27 ENCOUNTER — Other Ambulatory Visit: Payer: Self-pay | Admitting: General Practice

## 2016-09-27 DIAGNOSIS — M25552 Pain in left hip: Secondary | ICD-10-CM | POA: Diagnosis not present

## 2016-09-27 DIAGNOSIS — D509 Iron deficiency anemia, unspecified: Secondary | ICD-10-CM | POA: Diagnosis not present

## 2016-09-27 DIAGNOSIS — E039 Hypothyroidism, unspecified: Secondary | ICD-10-CM | POA: Diagnosis not present

## 2016-09-27 DIAGNOSIS — M810 Age-related osteoporosis without current pathological fracture: Secondary | ICD-10-CM | POA: Diagnosis not present

## 2016-09-27 DIAGNOSIS — I480 Paroxysmal atrial fibrillation: Secondary | ICD-10-CM

## 2016-09-27 DIAGNOSIS — M25511 Pain in right shoulder: Secondary | ICD-10-CM | POA: Diagnosis not present

## 2016-09-27 DIAGNOSIS — J449 Chronic obstructive pulmonary disease, unspecified: Secondary | ICD-10-CM | POA: Diagnosis not present

## 2016-09-27 DIAGNOSIS — I509 Heart failure, unspecified: Secondary | ICD-10-CM | POA: Diagnosis not present

## 2016-09-27 DIAGNOSIS — I2510 Atherosclerotic heart disease of native coronary artery without angina pectoris: Secondary | ICD-10-CM | POA: Diagnosis not present

## 2016-09-27 DIAGNOSIS — R339 Retention of urine, unspecified: Secondary | ICD-10-CM | POA: Diagnosis not present

## 2016-09-27 DIAGNOSIS — I110 Hypertensive heart disease with heart failure: Secondary | ICD-10-CM | POA: Diagnosis not present

## 2016-09-27 DIAGNOSIS — M25562 Pain in left knee: Secondary | ICD-10-CM | POA: Diagnosis not present

## 2016-09-27 MED ORDER — WARFARIN SODIUM 3 MG PO TABS: ORAL_TABLET | ORAL | 2 refills | Status: DC

## 2016-09-27 NOTE — Telephone Encounter (Signed)
Pt needs the warfarin called in please to [facility]

## 2016-09-27 NOTE — Telephone Encounter (Signed)
I contacted the patient and advised Warfarin would need to come from PCP.

## 2016-09-28 ENCOUNTER — Ambulatory Visit: Payer: Medicare Other | Admitting: Family Medicine

## 2016-09-30 DIAGNOSIS — R269 Unspecified abnormalities of gait and mobility: Secondary | ICD-10-CM | POA: Diagnosis not present

## 2016-09-30 DIAGNOSIS — M25562 Pain in left knee: Secondary | ICD-10-CM | POA: Diagnosis not present

## 2016-09-30 DIAGNOSIS — M25511 Pain in right shoulder: Secondary | ICD-10-CM | POA: Diagnosis not present

## 2016-09-30 DIAGNOSIS — D509 Iron deficiency anemia, unspecified: Secondary | ICD-10-CM | POA: Diagnosis not present

## 2016-09-30 DIAGNOSIS — R339 Retention of urine, unspecified: Secondary | ICD-10-CM | POA: Diagnosis not present

## 2016-09-30 DIAGNOSIS — M810 Age-related osteoporosis without current pathological fracture: Secondary | ICD-10-CM | POA: Diagnosis not present

## 2016-09-30 DIAGNOSIS — I480 Paroxysmal atrial fibrillation: Secondary | ICD-10-CM | POA: Diagnosis not present

## 2016-09-30 DIAGNOSIS — I110 Hypertensive heart disease with heart failure: Secondary | ICD-10-CM | POA: Diagnosis not present

## 2016-09-30 DIAGNOSIS — E039 Hypothyroidism, unspecified: Secondary | ICD-10-CM | POA: Diagnosis not present

## 2016-09-30 DIAGNOSIS — I2510 Atherosclerotic heart disease of native coronary artery without angina pectoris: Secondary | ICD-10-CM | POA: Diagnosis not present

## 2016-09-30 DIAGNOSIS — I509 Heart failure, unspecified: Secondary | ICD-10-CM | POA: Diagnosis not present

## 2016-09-30 DIAGNOSIS — J449 Chronic obstructive pulmonary disease, unspecified: Secondary | ICD-10-CM | POA: Diagnosis not present

## 2016-09-30 DIAGNOSIS — J45909 Unspecified asthma, uncomplicated: Secondary | ICD-10-CM | POA: Diagnosis not present

## 2016-09-30 DIAGNOSIS — M25552 Pain in left hip: Secondary | ICD-10-CM | POA: Diagnosis not present

## 2016-10-04 ENCOUNTER — Telehealth: Payer: Self-pay | Admitting: Family Medicine

## 2016-10-04 ENCOUNTER — Other Ambulatory Visit: Payer: Self-pay | Admitting: Internal Medicine

## 2016-10-04 DIAGNOSIS — Z952 Presence of prosthetic heart valve: Secondary | ICD-10-CM

## 2016-10-04 DIAGNOSIS — M810 Age-related osteoporosis without current pathological fracture: Secondary | ICD-10-CM | POA: Diagnosis not present

## 2016-10-04 DIAGNOSIS — D509 Iron deficiency anemia, unspecified: Secondary | ICD-10-CM | POA: Diagnosis not present

## 2016-10-04 DIAGNOSIS — I480 Paroxysmal atrial fibrillation: Secondary | ICD-10-CM

## 2016-10-04 DIAGNOSIS — M25511 Pain in right shoulder: Secondary | ICD-10-CM | POA: Diagnosis not present

## 2016-10-04 DIAGNOSIS — R0602 Shortness of breath: Secondary | ICD-10-CM

## 2016-10-04 DIAGNOSIS — E039 Hypothyroidism, unspecified: Secondary | ICD-10-CM | POA: Diagnosis not present

## 2016-10-04 DIAGNOSIS — I509 Heart failure, unspecified: Secondary | ICD-10-CM | POA: Diagnosis not present

## 2016-10-04 DIAGNOSIS — I110 Hypertensive heart disease with heart failure: Secondary | ICD-10-CM | POA: Diagnosis not present

## 2016-10-04 DIAGNOSIS — M25552 Pain in left hip: Secondary | ICD-10-CM | POA: Diagnosis not present

## 2016-10-04 DIAGNOSIS — J449 Chronic obstructive pulmonary disease, unspecified: Secondary | ICD-10-CM | POA: Diagnosis not present

## 2016-10-04 DIAGNOSIS — R339 Retention of urine, unspecified: Secondary | ICD-10-CM | POA: Diagnosis not present

## 2016-10-04 DIAGNOSIS — I2510 Atherosclerotic heart disease of native coronary artery without angina pectoris: Secondary | ICD-10-CM | POA: Diagnosis not present

## 2016-10-04 DIAGNOSIS — I428 Other cardiomyopathies: Secondary | ICD-10-CM

## 2016-10-04 DIAGNOSIS — M25562 Pain in left knee: Secondary | ICD-10-CM | POA: Diagnosis not present

## 2016-10-04 MED ORDER — WARFARIN SODIUM 3 MG PO TABS: ORAL_TABLET | ORAL | 2 refills | Status: DC

## 2016-10-04 NOTE — Telephone Encounter (Signed)
° ° ° °  Pt call to sat [facility] told her they did not have a refill for the below med. Can you call the below med in again please   warfarin (COUMADIN) 3 MG tablet   Danielle [name]

## 2016-10-04 NOTE — Telephone Encounter (Signed)
Rx resent.

## 2016-10-04 NOTE — Telephone Encounter (Signed)
Please review for refill. Thanks!  

## 2016-10-06 DIAGNOSIS — J449 Chronic obstructive pulmonary disease, unspecified: Secondary | ICD-10-CM | POA: Diagnosis not present

## 2016-10-06 DIAGNOSIS — R339 Retention of urine, unspecified: Secondary | ICD-10-CM | POA: Diagnosis not present

## 2016-10-06 DIAGNOSIS — I509 Heart failure, unspecified: Secondary | ICD-10-CM | POA: Diagnosis not present

## 2016-10-06 DIAGNOSIS — I480 Paroxysmal atrial fibrillation: Secondary | ICD-10-CM | POA: Diagnosis not present

## 2016-10-06 DIAGNOSIS — Z9181 History of falling: Secondary | ICD-10-CM | POA: Diagnosis not present

## 2016-10-06 DIAGNOSIS — M25552 Pain in left hip: Secondary | ICD-10-CM | POA: Diagnosis not present

## 2016-10-06 DIAGNOSIS — M25562 Pain in left knee: Secondary | ICD-10-CM | POA: Diagnosis not present

## 2016-10-06 DIAGNOSIS — Z5181 Encounter for therapeutic drug level monitoring: Secondary | ICD-10-CM | POA: Diagnosis not present

## 2016-10-06 DIAGNOSIS — I110 Hypertensive heart disease with heart failure: Secondary | ICD-10-CM | POA: Diagnosis not present

## 2016-10-06 DIAGNOSIS — I2510 Atherosclerotic heart disease of native coronary artery without angina pectoris: Secondary | ICD-10-CM | POA: Diagnosis not present

## 2016-10-06 DIAGNOSIS — D509 Iron deficiency anemia, unspecified: Secondary | ICD-10-CM | POA: Diagnosis not present

## 2016-10-06 DIAGNOSIS — M25511 Pain in right shoulder: Secondary | ICD-10-CM | POA: Diagnosis not present

## 2016-10-06 DIAGNOSIS — Z7951 Long term (current) use of inhaled steroids: Secondary | ICD-10-CM | POA: Diagnosis not present

## 2016-10-06 DIAGNOSIS — Z7901 Long term (current) use of anticoagulants: Secondary | ICD-10-CM | POA: Diagnosis not present

## 2016-10-06 DIAGNOSIS — M810 Age-related osteoporosis without current pathological fracture: Secondary | ICD-10-CM | POA: Diagnosis not present

## 2016-10-06 DIAGNOSIS — E039 Hypothyroidism, unspecified: Secondary | ICD-10-CM | POA: Diagnosis not present

## 2016-10-07 ENCOUNTER — Telehealth: Payer: Self-pay | Admitting: Family Medicine

## 2016-10-07 NOTE — Telephone Encounter (Signed)
Pt needs new rx valacyclovir 1 gram .[facility] [address]

## 2016-10-08 MED ORDER — VALACYCLOVIR HCL 1 G PO TABS: ORAL_TABLET | ORAL | 0 refills | Status: DC

## 2016-10-08 NOTE — Telephone Encounter (Signed)
Rx sent in & voicemail left for patient letting her know, also asked for her to return my call to find out information on outbreak.

## 2016-10-08 NOTE — Telephone Encounter (Signed)
Pt following up on refill request Medication  valACYclovir (VALTREX) 1000 MG tablet [13132]   Pt states it has been about a year since her last outbreak/

## 2016-10-08 NOTE — Telephone Encounter (Signed)
Patient has genital herpes, see hospital note on 07/2015.

## 2016-10-08 NOTE — Telephone Encounter (Signed)
If she is having an outbreak now and has been < 48 hours + given the fact that it is Friday, a Rx for valtrex 1000 mg to take 1 tab qd x 5 days can be called in, #10/0 We can discussed treatment of recurrent genital herpes next OV.  Thanks, BJ

## 2016-10-11 ENCOUNTER — Ambulatory Visit: Payer: Medicare Other | Admitting: Endocrinology

## 2016-10-13 ENCOUNTER — Ambulatory Visit (INDEPENDENT_AMBULATORY_CARE_PROVIDER_SITE_OTHER): Payer: Medicare Other | Admitting: General Practice

## 2016-10-13 DIAGNOSIS — I509 Heart failure, unspecified: Secondary | ICD-10-CM | POA: Diagnosis not present

## 2016-10-13 DIAGNOSIS — J449 Chronic obstructive pulmonary disease, unspecified: Secondary | ICD-10-CM | POA: Diagnosis not present

## 2016-10-13 DIAGNOSIS — I110 Hypertensive heart disease with heart failure: Secondary | ICD-10-CM | POA: Diagnosis not present

## 2016-10-13 DIAGNOSIS — E039 Hypothyroidism, unspecified: Secondary | ICD-10-CM | POA: Diagnosis not present

## 2016-10-13 DIAGNOSIS — Z9181 History of falling: Secondary | ICD-10-CM | POA: Diagnosis not present

## 2016-10-13 DIAGNOSIS — M25562 Pain in left knee: Secondary | ICD-10-CM | POA: Diagnosis not present

## 2016-10-13 DIAGNOSIS — R339 Retention of urine, unspecified: Secondary | ICD-10-CM | POA: Diagnosis not present

## 2016-10-13 DIAGNOSIS — Z7901 Long term (current) use of anticoagulants: Secondary | ICD-10-CM

## 2016-10-13 DIAGNOSIS — I2510 Atherosclerotic heart disease of native coronary artery without angina pectoris: Secondary | ICD-10-CM | POA: Diagnosis not present

## 2016-10-13 DIAGNOSIS — I480 Paroxysmal atrial fibrillation: Secondary | ICD-10-CM | POA: Diagnosis not present

## 2016-10-13 DIAGNOSIS — M25511 Pain in right shoulder: Secondary | ICD-10-CM | POA: Diagnosis not present

## 2016-10-13 DIAGNOSIS — Z5181 Encounter for therapeutic drug level monitoring: Secondary | ICD-10-CM

## 2016-10-13 DIAGNOSIS — M25552 Pain in left hip: Secondary | ICD-10-CM | POA: Diagnosis not present

## 2016-10-13 DIAGNOSIS — M810 Age-related osteoporosis without current pathological fracture: Secondary | ICD-10-CM | POA: Diagnosis not present

## 2016-10-13 DIAGNOSIS — Z7951 Long term (current) use of inhaled steroids: Secondary | ICD-10-CM | POA: Diagnosis not present

## 2016-10-13 DIAGNOSIS — D509 Iron deficiency anemia, unspecified: Secondary | ICD-10-CM | POA: Diagnosis not present

## 2016-10-13 LAB — POCT INR: INR: 3.6

## 2016-10-13 NOTE — Patient Instructions (Signed)
Pre visit review using our clinic review tool, if applicable. No additional management support is needed unless otherwise documented below in the visit note. 

## 2016-10-15 ENCOUNTER — Ambulatory Visit: Payer: Medicare Other | Admitting: Internal Medicine

## 2016-10-18 NOTE — Progress Notes (Signed)
HPI:   [name] is a 71 y.o. female, who is here today with her husband to follow on some chronic medical problems.  She was last seen on 07/22/16.  Chronic pain: Generalized OA: Shoulder,lower back,knees,and right hip.  She is currently on Hydrocodone-Acetaminophen 5-325 mg tid as needed. Today she states that medication is not helping at all Sometimes she takes 2 tabs,still does not help. Left hip and knee pain exacerbated by walking. Ortho appt 10/25/16 because LLE still "feeling heavy." S/P left hip fracture 11/2015.  Lower back "still bad", not radiated. Exacerbated by standing up.  Achy like pain, 8/10.   Cymbalta was increased to 60 mg last OV, she discontinue because it was not helping.  She took Tramadol in the past and did help.   PT at home 2 times per week, she has last session next week.  Hydrocodone was started when I first saw her, during follow up she has reported "great" improvement of pain with medication (07/22/16)   Concerns today: "Skin soreness." She cannot explained where pain is localized, she points to lateral trunk  and left breast.  She states that feels "funny", on areas she had chest tubes (right and left). S/P spontaneous right pneumothorax after open chest surgery for valve replacement and left pneumothorax 10/2015. She denies chest pain, wheezing,or worsening exertional dyspnea. Denies orthopnea or PND.  Discomfort on these area is exacerbated by wearing bra. She has not noted rash. Hx of COPD, she follows with Dr Melvyn Novas. Soreness of left nipple for a month, no nipple discharge,edema,or erythema. She denies Hx of trauma. Exacerbated by palpation of nipple. No masses. Last mammogram 10/2015.  Atrial fib, CHF, and S/P bioprosthetic aortic valve replacement. She follows with cardiologists, Dr End. Hyperthyroidism, she follows with endocrinologists, Dr Loanne Drilling.   Review of Systems  Constitutional: Positive for fatigue.  Negative for appetite change and fever.  HENT: Negative for mouth sores, nosebleeds and trouble swallowing.   Eyes: Negative for redness and visual disturbance.  Respiratory: Negative for cough and wheezing.   Cardiovascular: Negative for chest pain, palpitations and leg swelling.  Gastrointestinal: Negative for abdominal pain, nausea and vomiting.       Negative for changes in bowel habits.  Endocrine: Negative for cold intolerance and heat intolerance.  Genitourinary: Negative for decreased urine volume, dysuria and hematuria.  Musculoskeletal: Positive for arthralgias, back pain and gait problem.  Skin: Negative for rash and wound.  Neurological: Negative for syncope, weakness and headaches.  Psychiatric/Behavioral: Negative for confusion. The patient is nervous/anxious.       Current Outpatient Prescriptions on File Prior to Visit  Medication Sig Dispense Refill  . albuterol (PROAIR HFA) 108 (90 Base) MCG/ACT inhaler Inhale 2 puffs into the lungs every 6 (six) hours as needed for wheezing or shortness of breath.     Marland Kitchen alendronate (FOSAMAX) 70 MG tablet Take 1 tablet (70 mg total) by mouth once a week. with a full glass of water on an empty stomach. 13 tablet 2  . atorvastatin (LIPITOR) 40 MG tablet Take 1 tablet (40 mg total) by mouth at bedtime. 90 tablet 2  . budesonide-formoterol (SYMBICORT) 160-4.5 MCG/ACT inhaler Inhale 2 puffs into the lungs 2 (two) times daily. 1 Inhaler 0  . Cholecalciferol (VITAMIN D3) 400 units tablet Take 1 tablet (400 Units total) by mouth daily. 30 tablet 0  . diclofenac sodium (VOLTAREN) 1 % GEL Apply 4 g topically 4 (four) times daily. 500 g 3  .  furosemide (LASIX) 20 MG tablet Take 1 tablet (20 mg total) by mouth daily as needed for fluid or edema. 90 tablet 1  . losartan (COZAAR) 25 MG tablet TAKE 1 TABLET BY MOUTH ONCE DAILY 90 tablet 2  . methimazole (TAPAZOLE) 5 MG tablet Take 3 tablets (15 mg total) by mouth every evening. 90 tablet 1  .  ranitidine (ZANTAC) 150 MG tablet Take 1 tablet (150 mg total) by mouth 2 (two) times daily. 180 tablet 3  . senna-docusate (SENOKOT-S) 8.6-50 MG tablet Take 1 tablet by mouth 2 (two) times daily.     . valACYclovir (VALTREX) 1000 MG tablet Take 1 tablet by mouth for 5 days. 10 tablet 0  . warfarin (COUMADIN) 3 MG tablet Take as directed by anticoagulation clinic. 40 tablet 2  . carvedilol (COREG) 25 MG tablet Take 1 tablet (25 mg total) by mouth 2 (two) times daily. 180 tablet 1   No current facility-administered medications on file prior to visit.      Past Medical History:  Diagnosis Date  . Allergy   . Aortic stenosis    Status post bioprosthetic AVR  . Arthritis    DJD  . Asthma   . Chronic systolic CHF (congestive heart failure) (HCC)    EF 30% 04/2014  . Coronary artery disease   . Gastritis   . Hyperlipidemia   . Hypertension   . Hyperthyroidism   . Multiple thyroid nodules   . Osteoporosis   . Paroxysmal atrial fibrillation (HCC)   . Pelvis fracture ([place]) 08/19/2015   MULTIPLE   . Spontaneous pneumothorax 11/28/2015   left   . Stroke [facility])    Allergies  Allergen Reactions  . Lisinopril Cough  . Tetanus Toxoid Adsorbed Swelling    Unknown     Social History   Social History  . Marital status: Married    Spouse name: Sherwood  . Number of children: 0  . Years of education: N/A   Occupational History  . Retired in 2004    Social History Main Topics  . Smoking status: Former Smoker    Packs/day: 0.25    Years: 10.00    Types: Cigarettes    Quit date: 03/01/2010  . Smokeless tobacco: Never Used  . Alcohol use No  . Drug use: No  . Sexual activity: Not Asked   Other Topics Concern  . None   Social History Narrative   Lives with husband.  Ambulated independently.    Vitals:   10/19/16 1521  BP: 120/80  Pulse: 71  Resp: 12  SpO2: 92%   Body mass index is 35.26 kg/m.   Physical Exam  Nursing note and vitals reviewed. Constitutional: She  is oriented to person, place, and time. She appears well-developed. No distress.  HENT:  Head: Normocephalic and atraumatic.  Mouth/Throat: Oropharynx is clear and moist and mucous membranes are normal.  Eyes: Pupils are equal, round, and reactive to light. Conjunctivae are normal.  Neck: No JVD present.  Cardiovascular: Normal rate.  An irregular rhythm present.  Occasional extrasystoles are present.  No murmur heard. Pulses:      Dorsalis pedis pulses are 2+ on the right side, and 2+ on the left side.  Respiratory: Effort normal and breath sounds normal. No respiratory distress.  GI: Soft. She exhibits no mass. There is no hepatomegaly. There is no tenderness.  Genitourinary: No breast swelling.  Genitourinary Comments: Left nipple pain with palpation No masses,erythema,or edema. No breast masses bilateral.  Musculoskeletal: She exhibits no edema.       Lumbar back: She exhibits no tenderness and no bony tenderness.  Today she can walk and get on exam table. Antalgic gait.   Lymphadenopathy:    She has no cervical adenopathy.    She has no axillary adenopathy.  Neurological: She is alert and oriented to person, place, and time. Coordination normal.  No focal deficit appreciated.  Skin: Skin is warm. No rash noted. No erythema.  Hyperpigmented scarring changes on lateral aspect of thorax, no edema or erythema. No tenderness upon palpation.  Psychiatric: Her mood appears anxious.  fairly groomed, poor eye contact.     ASSESSMENT AND PLAN:   Ms. Carylon was seen today for follow-up.  Diagnoses and all orders for this visit:  Generalized osteoarthritis of multiple sites  Hydrocodone is not longer helping, so will start weaning medication off. She did not tolerate Cymbalta. She will start Tramadol 50 mg tid prn. Side effects discussed. Fall precautions. She is following with ortho, Dr Erlinda Hong, for left hip pain.  -     Ambulatory referral to Pain Clinic -      HYDROcodone-acetaminophen (NORCO/VICODIN) 5-325 MG tablet; 1 tab daily for 5 days then every other day for 5 days. -     traMADol (ULTRAM) 50 MG tablet; Take 1 tablet (50 mg total) by mouth every 8 (eight) hours as needed.  Nipple pain  Examination today is not suggestive of infectious process.  ? Radicular pain. Dx mammogram to be arranged.  -     MM Digital Diagnostic Bilat; Future  Chronic pain disorder  Pain management referral placed, she may benefit of other non pharmacologic treatment along with medication. Side effects of mediations discussed. Because Hx of CAD,CHF,and on Coumadin oral NSAID's are not indicated. Her insurance does not cover topical Diclofenac.  -     Ambulatory referral to Pain Clinic -     traMADol (ULTRAM) 50 MG tablet; Take 1 tablet (50 mg total) by mouth every 8 (eight) hours as needed.  Paroxysmal atrial fibrillation (HCC)  Rate controlled today. Continue Coumadin and Carvedilol.  She will continue following with Dr End   In regard to skin sensation around area where chest tubes were placed, examination today negative for rash and lung auscultation is normal. Instructed to continue monitoring. Instructed about warning signs.  I will see her back in 4 months to follow on HTN.   -[name] was advised to return sooner than planned today if new concerns arise.      [name], MD  [facility]. [place] office.

## 2016-10-19 ENCOUNTER — Encounter: Payer: Self-pay | Admitting: Family Medicine

## 2016-10-19 ENCOUNTER — Ambulatory Visit (INDEPENDENT_AMBULATORY_CARE_PROVIDER_SITE_OTHER): Payer: Medicare Other | Admitting: Family Medicine

## 2016-10-19 VITALS — BP 120/80 | HR 71 | Resp 12 | Ht 67.0 in | Wt 225.1 lb

## 2016-10-19 DIAGNOSIS — N644 Mastodynia: Secondary | ICD-10-CM | POA: Diagnosis not present

## 2016-10-19 DIAGNOSIS — G894 Chronic pain syndrome: Secondary | ICD-10-CM

## 2016-10-19 DIAGNOSIS — M159 Polyosteoarthritis, unspecified: Secondary | ICD-10-CM

## 2016-10-19 DIAGNOSIS — I480 Paroxysmal atrial fibrillation: Secondary | ICD-10-CM | POA: Diagnosis not present

## 2016-10-19 MED ORDER — HYDROCODONE-ACETAMINOPHEN 5-325 MG PO TABS: ORAL_TABLET | ORAL | 0 refills | Status: AC

## 2016-10-19 MED ORDER — TRAMADOL HCL 50 MG PO TABS: 50.0000 mg | ORAL_TABLET | Freq: Three times a day (TID) | ORAL | 0 refills | Status: DC | PRN

## 2016-10-19 NOTE — Patient Instructions (Signed)
A few things to remember from today's visit:   Generalized osteoarthritis of multiple sites - Plan: Ambulatory referral to Pain Clinic, HYDROcodone-acetaminophen (NORCO/VICODIN) 5-325 MG tablet, traMADol (ULTRAM) 50 MG tablet  Nipple pain - Plan: MM Digital Diagnostic Bilat  Chronic pain disorder - Plan: Ambulatory referral to Pain Clinic, traMADol (ULTRAM) 50 MG tablet  Wean off Hydrocodone and Tramadol started.   Please be sure medication list is accurate. If a new problem present, please set up appointment sooner than planned today.

## 2016-10-21 DIAGNOSIS — E039 Hypothyroidism, unspecified: Secondary | ICD-10-CM | POA: Diagnosis not present

## 2016-10-21 DIAGNOSIS — R339 Retention of urine, unspecified: Secondary | ICD-10-CM | POA: Diagnosis not present

## 2016-10-21 DIAGNOSIS — Z5181 Encounter for therapeutic drug level monitoring: Secondary | ICD-10-CM | POA: Diagnosis not present

## 2016-10-21 DIAGNOSIS — M25511 Pain in right shoulder: Secondary | ICD-10-CM | POA: Diagnosis not present

## 2016-10-21 DIAGNOSIS — D509 Iron deficiency anemia, unspecified: Secondary | ICD-10-CM | POA: Diagnosis not present

## 2016-10-21 DIAGNOSIS — M810 Age-related osteoporosis without current pathological fracture: Secondary | ICD-10-CM | POA: Diagnosis not present

## 2016-10-21 DIAGNOSIS — J449 Chronic obstructive pulmonary disease, unspecified: Secondary | ICD-10-CM | POA: Diagnosis not present

## 2016-10-21 DIAGNOSIS — I509 Heart failure, unspecified: Secondary | ICD-10-CM | POA: Diagnosis not present

## 2016-10-21 DIAGNOSIS — Z7901 Long term (current) use of anticoagulants: Secondary | ICD-10-CM | POA: Diagnosis not present

## 2016-10-21 DIAGNOSIS — M25562 Pain in left knee: Secondary | ICD-10-CM | POA: Diagnosis not present

## 2016-10-21 DIAGNOSIS — I2510 Atherosclerotic heart disease of native coronary artery without angina pectoris: Secondary | ICD-10-CM | POA: Diagnosis not present

## 2016-10-21 DIAGNOSIS — I110 Hypertensive heart disease with heart failure: Secondary | ICD-10-CM | POA: Diagnosis not present

## 2016-10-21 DIAGNOSIS — I480 Paroxysmal atrial fibrillation: Secondary | ICD-10-CM | POA: Diagnosis not present

## 2016-10-21 DIAGNOSIS — Z9181 History of falling: Secondary | ICD-10-CM | POA: Diagnosis not present

## 2016-10-21 DIAGNOSIS — M25552 Pain in left hip: Secondary | ICD-10-CM | POA: Diagnosis not present

## 2016-10-21 DIAGNOSIS — Z7951 Long term (current) use of inhaled steroids: Secondary | ICD-10-CM | POA: Diagnosis not present

## 2016-10-23 ENCOUNTER — Encounter: Payer: Self-pay | Admitting: Family Medicine

## 2016-10-25 ENCOUNTER — Ambulatory Visit (INDEPENDENT_AMBULATORY_CARE_PROVIDER_SITE_OTHER): Payer: Medicare Other

## 2016-10-25 ENCOUNTER — Ambulatory Visit (INDEPENDENT_AMBULATORY_CARE_PROVIDER_SITE_OTHER): Payer: Medicare Other | Admitting: Orthopaedic Surgery

## 2016-10-25 DIAGNOSIS — S72002D Fracture of unspecified part of neck of left femur, subsequent encounter for closed fracture with routine healing: Secondary | ICD-10-CM | POA: Diagnosis not present

## 2016-10-25 DIAGNOSIS — M25552 Pain in left hip: Secondary | ICD-10-CM

## 2016-10-25 NOTE — Progress Notes (Signed)
Office Visit Note   Patient: Danielle [name]           Date of Birth: 1945/11/28           MRN: [id number] Visit Date: 10/25/2016              Requested by: [name], [name], MD [address] [place], [place] [address] PCP: [name], [name], MD   Assessment & Plan: Visit Diagnoses:  1. Pain in left hip   2. Closed fracture of left hip with routine healing, subsequent encounter     Plan: Her x-rays demonstrate complete healing of the fracture without any complications of the hardware. I had a long discussion with her and her husband about her potential to ambulate without any assistive devices. I think this is unlikely that she'll be totally independent with ambulation. I feel that at best she will walk with a cane and walker half of the time. She is also been afraid from a psychological standpoint of falling again. I think she has essentially reached MMI. I'll see her back as needed. Questions encouraged and answered. Total face to face encounter time was greater than 25 minutes and over half of this time was spent in counseling and/or coordination of care.  Follow-Up Instructions: Return if symptoms worsen or fail to improve.   Orders:  Orders Placed This Encounter  Procedures  . XR HIP UNILAT W OR W/O PELVIS 2-3 VIEWS LEFT   No orders of the defined types were placed in this encounter.     Procedures: No procedures performed   Clinical Data: No additional findings.   Subjective: Chief Complaint  Patient presents with  . Left Hip - Pain    Patient is 11 months status post intramedullary fixation of left intertrochanteric hip fracture. She is walking small distances with a walker and longer distances with a wheelchair. She is also having anxiety about walking in general since the fall. Complains of pain with activity.    Review of Systems  Constitutional: Negative.   HENT: Negative.   Eyes: Negative.   Respiratory: Negative.   Cardiovascular:  Negative.   Endocrine: Negative.   Musculoskeletal: Negative.   Neurological: Negative.   Hematological: Negative.   Psychiatric/Behavioral: Negative.   All other systems reviewed and are negative.    Objective: Vital Signs: There were no vitals taken for this visit.  Physical Exam  Constitutional: She is oriented to person, place, and time. She appears well-developed and well-nourished.  Pulmonary/Chest: Effort normal.  Neurological: She is alert and oriented to person, place, and time.  Skin: Skin is warm. Capillary refill takes less than 2 seconds.  Psychiatric: She has a normal mood and affect. Her behavior is normal. Judgment and thought content normal.  Nursing note and vitals reviewed.   Ortho Exam Left lower extremity exam is essentially benign. Fully healed surgical scars. Specialty Comments:  No specialty comments available.  Imaging: Xr Hip Unilat W Or W/o Pelvis 2-3 Views Left  Result Date: 10/25/2016 Healed fracture with stable fixation    PMFS History: Patient Active Problem List   Diagnosis Date Noted  . Encounter for therapeutic drug monitoring 06/16/2016  . Generalized osteoarthritis of multiple sites 05/04/2016  . Chronic anticoagulation 05/04/2016  . Stroke ([place]) 04/14/2016  . Aortic valve disease 04/10/2016  . S/P AVR 04/10/2016  . Nonischemic cardiomyopathy ([place]) 04/10/2016  . Upper airway cough syndrome 03/14/2016  . Morbid obesity due to excess calories ([place]) 03/14/2016  . COPD GOLD 0  03/11/2016  . Thrombocytopenia ([place]) 12/12/2015  . Anemia 12/12/2015  . Vitamin D deficiency 12/12/2015  . Acute urinary retention 12/11/2015  . Osteoporosis 12/11/2015  . Hip fracture ([place]) 12/10/2015  . Aortic atherosclerosis ([place]) 12/10/2015  . Lung blebs ([name]) 12/03/2015  . Pelvic fracture ([place]) 08/19/2015  . Fall at home 08/19/2015  . Essential hypertension 08/19/2015  . Hyperlipidemia 08/19/2015  . Asthma 08/19/2015  . Atrial fibrillation ([place])  08/19/2015  . Hyperthyroidism 08/19/2015  . Chronic systolic heart failure ([place])   . 3-vessel CAD   . Aortic stenosis    Past Medical History:  Diagnosis Date  . Allergy   . Aortic stenosis    Status post bioprosthetic AVR  . Arthritis    DJD  . Asthma   . Chronic systolic CHF (congestive heart failure) (HCC)    EF 30% 04/2014  . Coronary artery disease   . Gastritis   . Hyperlipidemia   . Hypertension   . Hyperthyroidism   . Multiple thyroid nodules   . Osteoporosis   . Paroxysmal atrial fibrillation (HCC)   . Pelvis fracture ([place]) 08/19/2015   MULTIPLE   . Spontaneous pneumothorax 11/28/2015   left   . Stroke [facility])     Family History  Problem Relation Age of Onset  . Diabetes Mother   . Heart attack Mother 19  . Diabetes Father   . Lung cancer Father   . Diabetes Sister   . Thyroid disease Sister   . Diabetes Sister   . HIV Brother     Past Surgical History:  Procedure Laterality Date  . CHEST TUBE INSERTION  10/2015  . CORONARY ARTERY BYPASS GRAFT  04/2014  . INTRAMEDULLARY (IM) NAIL INTERTROCHANTERIC Left 12/10/2015   Procedure: INTRAMEDULLARY (IM) NAIL INTERTROCHANTRIC;  Surgeon: Leandrew Koyanagi, MD;  Location: [facility];  Service: Orthopedics;  Laterality: Left;  . PLEURADESIS Left 12/03/2015   Procedure: PLEURADESIS;  Surgeon: Melrose Nakayama, MD;  Location: [facility];  Service: Thoracic;  Laterality: Left;  . RESECTION OF APICAL BLEB Left 12/03/2015   Procedure: BLEBECTOMY;  Surgeon: Melrose Nakayama, MD;  Location: [facility];  Service: Thoracic;  Laterality: Left;  . THORACOSCOPY  12/03/2015  . VALVE REPLACEMENT    . VIDEO ASSISTED THORACOSCOPY Left 12/03/2015   Procedure: VIDEO ASSISTED THORACOSCOPY;  Surgeon: Melrose Nakayama, MD;  Location: [facility];  Service: Thoracic;  Laterality: Left;   Social History   Occupational History  . Retired in 2004    Social History Main Topics  . Smoking status: Former Smoker    Packs/day: 0.25    Years: 10.00     Types: Cigarettes    Quit date: 03/01/2010  . Smokeless tobacco: Never Used  . Alcohol use No  . Drug use: No  . Sexual activity: Not on file

## 2016-10-26 DIAGNOSIS — I509 Heart failure, unspecified: Secondary | ICD-10-CM | POA: Diagnosis not present

## 2016-10-26 DIAGNOSIS — D509 Iron deficiency anemia, unspecified: Secondary | ICD-10-CM | POA: Diagnosis not present

## 2016-10-26 DIAGNOSIS — Z5181 Encounter for therapeutic drug level monitoring: Secondary | ICD-10-CM | POA: Diagnosis not present

## 2016-10-26 DIAGNOSIS — J449 Chronic obstructive pulmonary disease, unspecified: Secondary | ICD-10-CM | POA: Diagnosis not present

## 2016-10-26 DIAGNOSIS — M810 Age-related osteoporosis without current pathological fracture: Secondary | ICD-10-CM | POA: Diagnosis not present

## 2016-10-26 DIAGNOSIS — I2510 Atherosclerotic heart disease of native coronary artery without angina pectoris: Secondary | ICD-10-CM | POA: Diagnosis not present

## 2016-10-26 DIAGNOSIS — R339 Retention of urine, unspecified: Secondary | ICD-10-CM | POA: Diagnosis not present

## 2016-10-26 DIAGNOSIS — I110 Hypertensive heart disease with heart failure: Secondary | ICD-10-CM | POA: Diagnosis not present

## 2016-10-26 DIAGNOSIS — E039 Hypothyroidism, unspecified: Secondary | ICD-10-CM | POA: Diagnosis not present

## 2016-10-26 DIAGNOSIS — M25511 Pain in right shoulder: Secondary | ICD-10-CM | POA: Diagnosis not present

## 2016-10-26 DIAGNOSIS — I480 Paroxysmal atrial fibrillation: Secondary | ICD-10-CM | POA: Diagnosis not present

## 2016-10-26 DIAGNOSIS — Z9181 History of falling: Secondary | ICD-10-CM | POA: Diagnosis not present

## 2016-10-26 DIAGNOSIS — M25562 Pain in left knee: Secondary | ICD-10-CM | POA: Diagnosis not present

## 2016-10-26 DIAGNOSIS — Z7951 Long term (current) use of inhaled steroids: Secondary | ICD-10-CM | POA: Diagnosis not present

## 2016-10-26 DIAGNOSIS — M25552 Pain in left hip: Secondary | ICD-10-CM | POA: Diagnosis not present

## 2016-10-26 DIAGNOSIS — Z7901 Long term (current) use of anticoagulants: Secondary | ICD-10-CM | POA: Diagnosis not present

## 2016-10-26 NOTE — Patient Instructions (Signed)
Pre visit review using our clinic review tool, if applicable. No additional management support is needed unless otherwise documented below in the visit note. 

## 2016-10-27 ENCOUNTER — Ambulatory Visit (INDEPENDENT_AMBULATORY_CARE_PROVIDER_SITE_OTHER): Payer: Medicare Other | Admitting: General Practice

## 2016-10-27 DIAGNOSIS — Z7901 Long term (current) use of anticoagulants: Secondary | ICD-10-CM

## 2016-10-27 DIAGNOSIS — Z5181 Encounter for therapeutic drug level monitoring: Secondary | ICD-10-CM

## 2016-10-27 LAB — POCT INR: INR: 4

## 2016-11-08 ENCOUNTER — Telehealth: Payer: Self-pay | Admitting: Family Medicine

## 2016-11-08 DIAGNOSIS — M159 Polyosteoarthritis, unspecified: Secondary | ICD-10-CM

## 2016-11-08 DIAGNOSIS — G894 Chronic pain syndrome: Secondary | ICD-10-CM

## 2016-11-08 NOTE — Telephone Encounter (Signed)
Pt need new Rx for hydrocodone and tramadol   Pt is aware of 3 business days for refills and someone will call when ready for pick up.

## 2016-11-08 NOTE — Telephone Encounter (Signed)
Rx last filled 10/19/16.

## 2016-11-09 NOTE — Telephone Encounter (Signed)
Pt is calling to check on the status of the Rx's and would like to see if she could pick them up before the storm on Friday.

## 2016-11-09 NOTE — Telephone Encounter (Signed)
She was referred to pain clinic, appt is pending. Hydrocodone was to be discontinued. She can continue Tramadol 50 mg bid as needed for pain. Rx for # 60 tabs/0 can be called in.  If she has not seen pain management at the time she runs about, she needs a f/u appt.  Thanks, BJ

## 2016-11-10 MED ORDER — TRAMADOL HCL 50 MG PO TABS: 50.0000 mg | ORAL_TABLET | Freq: Two times a day (BID) | ORAL | 0 refills | Status: DC | PRN

## 2016-11-10 NOTE — Telephone Encounter (Signed)
I left a voicemail for patient letting her know the hydrocodone was discontinued, and that I would call the Rx for Tramadol into the [facility] on [address]. I advised patient to make an appointment with [place] when she starts to run low if she has not been seen by pain management yet. I also advised her to call the office back with any questions.  Rx for Tramadol called into the [facility] on [address].

## 2016-11-23 ENCOUNTER — Other Ambulatory Visit: Payer: Self-pay | Admitting: Family Medicine

## 2016-11-23 DIAGNOSIS — N644 Mastodynia: Secondary | ICD-10-CM

## 2016-11-24 ENCOUNTER — Telehealth: Payer: Self-pay | Admitting: Endocrinology

## 2016-11-24 ENCOUNTER — Ambulatory Visit (INDEPENDENT_AMBULATORY_CARE_PROVIDER_SITE_OTHER): Payer: Medicare Other | Admitting: General Practice

## 2016-11-24 DIAGNOSIS — Z7901 Long term (current) use of anticoagulants: Secondary | ICD-10-CM | POA: Diagnosis not present

## 2016-11-24 DIAGNOSIS — Z23 Encounter for immunization: Secondary | ICD-10-CM

## 2016-11-24 DIAGNOSIS — Z5181 Encounter for therapeutic drug level monitoring: Secondary | ICD-10-CM

## 2016-11-24 LAB — POCT INR: INR: 3.9

## 2016-11-24 NOTE — Patient Instructions (Signed)
Pre visit review using our clinic review tool, if applicable. No additional management support is needed unless otherwise documented below in the visit note. 

## 2016-11-24 NOTE — Telephone Encounter (Signed)
MEDICATION: methimazole (TAPAZOLE) 5 MG tablet  PHARMACY:   [place], [place] - [address] [phone] ([phone] (Fax)    IS THIS A 90 DAY SUPPLY : Y   IS PATIENT OUT OF MEDICATION: N  IF NOT; HOW MUCH IS LEFT: 6 pills left (takes 3 daily)  LAST APPOINTMENT DATE: 05/27/16  NEXT APPOINTMENT DATE: N/A  OTHER COMMENTS:    **Let patient know to contact pharmacy at the end of the day to make sure medication is ready. **  ** Please notify patient to allow 48-72 hours to process**  **Encourage patient to contact the pharmacy for refills or they can request refills through [facility]**

## 2016-11-26 NOTE — Telephone Encounter (Signed)
MEDICATION: methimazole (TAPAZOLE) 5 MG tablet  PHARMACY:   [place], [place] - [address] [phone] (Phone) [phone] (Fax)    IS THIS A 90 DAY SUPPLY : yes  IS PATIENT OUT OF MEDICATION: yes  IF NOT; HOW MUCH IS LEFT: n/a  LAST APPOINTMENT DATE: @3 /29/18  NEXT APPOINTMENT DATE:@Visit  date not found  OTHER COMMENTS:    **Let patient know to contact pharmacy at the end of the day to make sure medication is ready. **  ** Please notify patient to allow 48-72 hours to process**  **Encourage patient to contact the pharmacy for refills or they can request refills through [facility]**

## 2016-11-29 ENCOUNTER — Other Ambulatory Visit: Payer: Self-pay

## 2016-11-29 DIAGNOSIS — Z170 Estrogen receptor positive status [ER+]: Secondary | ICD-10-CM

## 2016-11-29 DIAGNOSIS — C50412 Malignant neoplasm of upper-outer quadrant of left female breast: Secondary | ICD-10-CM

## 2016-11-29 HISTORY — DX: Malignant neoplasm of upper-outer quadrant of left female breast: C50.412

## 2016-11-29 HISTORY — DX: Malignant neoplasm of upper-outer quadrant of left female breast: Z17.0

## 2016-11-29 MED ORDER — METHIMAZOLE 5 MG PO TABS: 15.0000 mg | ORAL_TABLET | Freq: Every evening | ORAL | 1 refills | Status: DC

## 2016-12-03 ENCOUNTER — Ambulatory Visit (INDEPENDENT_AMBULATORY_CARE_PROVIDER_SITE_OTHER): Payer: Medicare Other | Admitting: Internal Medicine

## 2016-12-03 ENCOUNTER — Encounter: Payer: Self-pay | Admitting: Internal Medicine

## 2016-12-03 VITALS — BP 124/64 | HR 64 | Ht 67.0 in | Wt 223.0 lb

## 2016-12-03 DIAGNOSIS — I428 Other cardiomyopathies: Secondary | ICD-10-CM

## 2016-12-03 DIAGNOSIS — I10 Essential (primary) hypertension: Secondary | ICD-10-CM | POA: Diagnosis not present

## 2016-12-03 DIAGNOSIS — I5022 Chronic systolic (congestive) heart failure: Secondary | ICD-10-CM | POA: Diagnosis not present

## 2016-12-03 DIAGNOSIS — I480 Paroxysmal atrial fibrillation: Secondary | ICD-10-CM | POA: Diagnosis not present

## 2016-12-03 DIAGNOSIS — Z953 Presence of xenogenic heart valve: Secondary | ICD-10-CM

## 2016-12-03 NOTE — Patient Instructions (Signed)
Medication Instructions:  Your physician recommends that you continue on your current medications as directed. Please refer to the Current Medication list given to you today.   Labwork: Your physician recommends that you return for lab work in: 2-3 weeks--BMET.   Testing/Procedures: Your physician has requested that you have an echocardiogram. Echocardiography is a painless test that uses sound waves to create images of your heart. It provides your doctor with information about the size and shape of your heart and how well your heart's chambers and valves are working. This procedure takes approximately one hour. There are no restrictions for this procedure.  IN ABOUT 3 MONTHS  Follow-Up: Your physician recommends that you schedule a follow-up appointment in: about 3 months with Dr End a few days after the echocardiogram has been done.        If you need a refill on your cardiac medications before your next appointment, please call your pharmacy.

## 2016-12-03 NOTE — Progress Notes (Signed)
Follow-up Outpatient Visit Date: 12/03/2016  Primary Care Provider: [name], [name], MD [address] [place] [address]  Chief Complaint: Shortness of breath  HPI:  Danielle [name] is a 71 y.o. year-old female with history of aortic valve disease (severe regurgitation by her description) s/p bioprosthetic aortic valve replacement in [place] in 03/2014, NICM with LVEF as low as 30-35% by report in 03/2015, paroxysmal atrial fibrillation on chronic warfarin, stroke x 2, hyperlipidemia, hypertension, hyperthyroidism, spontaneous pneumothorax, and left hip fracture, who presents for follow-up of chronic systolic heart failure and aortic valve disease. I last saw the patient in May, at which time she was still largely immobile secondary to her hip fracture last fall. Her mobility has improved and she is now walking some with a walker. She is limited by exertional dyspnea, which is stable to slightly improved compared to her last visit. After her last visit, we increased carvedilol to 25 mg twice a day, which she has tolerated well. [name] has continued to gain some weight but feels like this is due to her immobility and deconditioning rather than fluid retention. She has not had any chest pain or palpitations, though she notes occasional orthostatic lightheadedness. She has chronic 2 pillow orthopnea that has been unchanged for years. She began taking her as needed furosemide on a daily basis a few days ago at the urging of a home health nurse due to some swelling in her feet.  --------------------------------------------------------------------------------------------------  Cardiovascular History & Procedures: Cardiovascular Problems:  Aortic valve disease status post bioprosthetic AVR in 03/2014  Non-ischemic cardiomyopathy  Paroxysmal atrial fibrillation  Stroke  Risk Factors:  Hypertension, hyperlipidemia, stroke, and age > 31  Cath/PCI:  None available  (patient reports cath without significant CAD in the past)  CV Surgery:  Bioprosthetic aortic valve replacement (03/12/14, [place], [place])  EP Procedures and Devices:  None  Non-Invasive Evaluation(s):  TTE (04/21/16): Normal obese size with moderate LVH. LVEF 35-40% with mid and apical anteroseptal hypokinesis. Grade 3 diastolic dysfunction noted. Aortic valve bioprosthesis present with a mean gradient of 11 mmHg. Mitral annular calcification noted. Normal RV size and function. Mild right atrial enlargement.  TTE (03/27/15, OSH): Mild LVH with LVEF 30-35%, mild left atrial enlargement, AVR in place without regurgitation.  TTE (05/10/14, OSH): LVEF 45-50%  Recent CV Pertinent Labs: Lab Results  Component Value Date   INR 3.9 11/24/2016   INR 4.1 (A) 05/25/2016   K 5.2 07/08/2016   BUN 19 07/08/2016   CREATININE 0.98 07/08/2016    Past medical and surgical history were reviewed and updated in EPIC.  Current Meds  Medication Sig  . albuterol (PROAIR HFA) 108 (90 Base) MCG/ACT inhaler Inhale 2 puffs into the lungs every 6 (six) hours as needed for wheezing or shortness of breath.   Marland Kitchen alendronate (FOSAMAX) 70 MG tablet Take 1 tablet (70 mg total) by mouth once a week. with a full glass of water on an empty stomach.  Marland Kitchen atorvastatin (LIPITOR) 40 MG tablet Take 1 tablet (40 mg total) by mouth at bedtime.  . budesonide-formoterol (SYMBICORT) 160-4.5 MCG/ACT inhaler Inhale 2 puffs into the lungs 2 (two) times daily.  . carvedilol (COREG) 25 MG tablet Take 1 tablet (25 mg total) by mouth 2 (two) times daily.  . Cholecalciferol (VITAMIN D3) 400 units tablet Take 1 tablet (400 Units total) by mouth daily.  . diclofenac sodium (VOLTAREN) 1 % GEL Apply 4 g topically 4 (four) times daily.  . furosemide (  LASIX) 20 MG tablet Take 1 tablet (20 mg total) by mouth daily as needed for fluid or edema.  Marland Kitchen losartan (COZAAR) 25 MG tablet TAKE 1 TABLET BY MOUTH ONCE DAILY  .  methimazole (TAPAZOLE) 5 MG tablet Take 3 tablets (15 mg total) by mouth every evening.  . ranitidine (ZANTAC) 150 MG tablet Take 1 tablet (150 mg total) by mouth 2 (two) times daily.  Marland Kitchen senna-docusate (SENOKOT-S) 8.6-50 MG tablet Take 1 tablet by mouth 2 (two) times daily.   . traMADol (ULTRAM) 50 MG tablet Take 1 tablet (50 mg total) by mouth 2 (two) times daily as needed (pain).  . valACYclovir (VALTREX) 1000 MG tablet Take 1 tablet by mouth for 5 days.  Marland Kitchen warfarin (COUMADIN) 3 MG tablet Take as directed by anticoagulation clinic.    Allergies: Lisinopril and Tetanus toxoid adsorbed  Social History   Social History  . Marital status: Married    Spouse name: Sherwood  . Number of children: 0  . Years of education: N/A   Occupational History  . Retired in 2004    Social History Main Topics  . Smoking status: Former Smoker    Packs/day: 0.25    Years: 10.00    Types: Cigarettes    Quit date: 03/01/2010  . Smokeless tobacco: Never Used  . Alcohol use No  . Drug use: No  . Sexual activity: Not on file   Other Topics Concern  . Not on file   Social History Narrative   Lives with husband.  Ambulated independently.    Family History  Problem Relation Age of Onset  . Diabetes Mother   . Heart attack Mother 47  . Diabetes Father   . Lung cancer Father   . Diabetes Sister   . Thyroid disease Sister   . Diabetes Sister   . HIV Brother     Review of Systems: A 12-system review of systems was performed and was negative except as noted in the HPI.  --------------------------------------------------------------------------------------------------  Physical Exam: BP 124/64   Pulse 64   Ht 5\' 7"  (1.702 m)   Wt 223 lb (101.2 kg)   SpO2 98%   BMI 34.93 kg/m   General:  Obese woman, seated comfortably in a wheelchair. She is accompanied by her husband. HEENT: No conjunctival pallor or scleral icterus. Moist mucous membranes.  OP clear. Neck: Supple without  lymphadenopathy, thyromegaly, JVD, or HJR. Lungs: Normal work of breathing. Clear to auscultation bilaterally without wheezes or crackles. Heart: Regular rate and rhythm. No murmurs, rubs, or gallops. Unable to assess PMI due to body habitus.. Abd: Bowel sounds present. Soft, NT/ND. Unable to assess hepatosplenomegaly due to body habitus. Ext: Trace ankle edema bilaterally. Radial, PT, and DP pulses are 2+ bilaterally. Skin: Warm and dry without rash.  Lab Results  Component Value Date   WBC 6.1 04/08/2016   HGB 13.1 04/08/2016   HCT 37.9 04/08/2016   MCV 92 04/08/2016   PLT 136 (L) 04/08/2016    Lab Results  Component Value Date   NA 140 07/08/2016   K 5.2 07/08/2016   CL 102 07/08/2016   CO2 24 07/08/2016   BUN 19 07/08/2016   CREATININE 0.98 07/08/2016   GLUCOSE 85 07/08/2016   ALT 34 12/10/2015    No results found for: CHOL, HDL, LDLCALC, LDLDIRECT, TRIG, CHOLHDL  --------------------------------------------------------------------------------------------------  ASSESSMENT AND PLAN: Chronic systolic heart failure secondary to nonischemic cardiomyopathy [name] appears euvolemic to slightly volume overloaded with NYHA class III symptoms.  Some of her dyspnea, however, may also be due to deconditioning. I agree with starting standing furosemide, which she began earlier this week. I will have her return in about 2 weeks to recheck a basic metabolic panel. If her potassium tolerates, we could consider increasing losartan at that time. We will plan to repeat an echo before she returns to see me in 3 months.  History of aortic valve replacement No evidence of valve dysfunction. We will plan to re-image the valve prior to the patient's follow-up visit with me in 3 months, given her continued shortness of breath. She should continue with warfarin, given her history of paroxysmal atrial fibrillation, as well as antibiotic prophylaxis for any dental  procedure.  Paroxysmal atrial fibrillation No symptoms to suggest recurrent atrial fibrillation. We will continue with indefinite anticoagulation with warfarin, given her CHADSVASC score of at least 6.  Essential hypertension Blood pressure is adequately controlled today. No medication changes at this time.  Follow-up: Return to clinic in 3 months.  [name], MD 12/03/2016 4:29 PM

## 2016-12-06 ENCOUNTER — Other Ambulatory Visit: Payer: Self-pay | Admitting: Family Medicine

## 2016-12-06 ENCOUNTER — Ambulatory Visit
Admission: RE | Admit: 2016-12-06 | Discharge: 2016-12-06 | Disposition: A | Discharge status: Home / Self Care | Payer: Medicare Other | Source: Ambulatory Visit | Attending: Family Medicine | Admitting: Family Medicine

## 2016-12-06 DIAGNOSIS — R922 Inconclusive mammogram: Secondary | ICD-10-CM | POA: Diagnosis not present

## 2016-12-06 DIAGNOSIS — N644 Mastodynia: Secondary | ICD-10-CM

## 2016-12-06 DIAGNOSIS — N6489 Other specified disorders of breast: Secondary | ICD-10-CM | POA: Diagnosis not present

## 2016-12-06 DIAGNOSIS — N6323 Unspecified lump in the left breast, lower outer quadrant: Secondary | ICD-10-CM | POA: Diagnosis not present

## 2016-12-06 DIAGNOSIS — N6321 Unspecified lump in the left breast, upper outer quadrant: Secondary | ICD-10-CM | POA: Diagnosis not present

## 2016-12-06 DIAGNOSIS — N6320 Unspecified lump in the left breast, unspecified quadrant: Secondary | ICD-10-CM

## 2016-12-07 ENCOUNTER — Ambulatory Visit (HOSPITAL_BASED_OUTPATIENT_CLINIC_OR_DEPARTMENT_OTHER): Payer: Medicare Other | Admitting: Physical Medicine & Rehabilitation

## 2016-12-07 ENCOUNTER — Encounter: Payer: Self-pay | Admitting: Physical Medicine & Rehabilitation

## 2016-12-07 ENCOUNTER — Encounter: Payer: Medicare Other | Attending: Physical Medicine & Rehabilitation

## 2016-12-07 DIAGNOSIS — G894 Chronic pain syndrome: Secondary | ICD-10-CM

## 2016-12-07 DIAGNOSIS — Z7901 Long term (current) use of anticoagulants: Secondary | ICD-10-CM | POA: Diagnosis not present

## 2016-12-07 DIAGNOSIS — M810 Age-related osteoporosis without current pathological fracture: Secondary | ICD-10-CM | POA: Insufficient documentation

## 2016-12-07 DIAGNOSIS — M25569 Pain in unspecified knee: Secondary | ICD-10-CM

## 2016-12-07 DIAGNOSIS — M25561 Pain in right knee: Secondary | ICD-10-CM | POA: Insufficient documentation

## 2016-12-07 DIAGNOSIS — R2689 Other abnormalities of gait and mobility: Secondary | ICD-10-CM

## 2016-12-07 DIAGNOSIS — G8929 Other chronic pain: Secondary | ICD-10-CM | POA: Insufficient documentation

## 2016-12-07 DIAGNOSIS — M159 Polyosteoarthritis, unspecified: Secondary | ICD-10-CM | POA: Diagnosis not present

## 2016-12-07 DIAGNOSIS — M545 Low back pain, unspecified: Secondary | ICD-10-CM

## 2016-12-07 DIAGNOSIS — M25562 Pain in left knee: Secondary | ICD-10-CM | POA: Diagnosis not present

## 2016-12-07 MED ORDER — TRAMADOL HCL 50 MG PO TABS: 50.0000 mg | ORAL_TABLET | Freq: Three times a day (TID) | ORAL | 1 refills | Status: DC

## 2016-12-07 NOTE — Progress Notes (Signed)
Subjective:    Patient ID: Danielle [name], female    DOB: 12/28/1945, 71 y.o.   MRN: [id number]  HPI  Chief complaint is low back pain  71 year old female who gives a one-year history of low back pain. She had a fall about one year ago in which she fractured the left hip.  Patient also has bilateral knee pain. She has tried Flexogenics, which was not helpful for her. Patient does not feel like the knee pain and back pain are connected.  Patient is independent with dressing and bathing She does some cooking and cleaning Patient mainly ambulates with walker  Has undergone home health therapy but not outpatient therapy. Reviewed orthopedic note from Dr. Erlinda Hong which indicates patient has good healing of her left intertrochanteric fracture. She has undergone IM nailing, x-rays from 10/26/2016, have been reviewed.  No numbness or tingling in the feet or legs. No bowel or bladder issues.   Walking tolerance with walker is 10 minutes. She needs assistance with shopping.  Pain Inventory Average Pain 5 Pain Right Now 5 My pain is aching  In the last 24 hours, has pain interfered with the following? General activity 3 Relation with others 7 Enjoyment of life 9 What TIME of day is your pain at its worst? morning Sleep (in general) Poor  Pain is worse with: walking and standing Pain improves with: medication Relief from Meds: 3  Mobility walk without assistance walk with assistance use a walker how many minutes can you walk? 10 ability to climb steps?  no do you drive?  no  Function not employed: date last employed . I need assistance with the following:  shopping  Neuro/Psych trouble walking dizziness  Prior Studies new visit  Physicians involved in your care new visit   Family History  Problem Relation Age of Onset  . Diabetes Mother   . Heart attack Mother 77  . Diabetes Father   . Lung cancer Father   . Diabetes Sister   . Thyroid disease  Sister   . Diabetes Sister   . HIV Brother    Social History   Social History  . Marital status: Married    Spouse name: Sherwood  . Number of children: 0  . Years of education: N/A   Occupational History  . Retired in 2004    Social History Main Topics  . Smoking status: Former Smoker    Packs/day: 0.25    Years: 10.00    Types: Cigarettes    Quit date: 03/01/2010  . Smokeless tobacco: Never Used  . Alcohol use No  . Drug use: No  . Sexual activity: Not Asked   Other Topics Concern  . None   Social History Narrative   Lives with husband.  Ambulated independently.   Past Surgical History:  Procedure Laterality Date  . CHEST TUBE INSERTION  10/2015  . CORONARY ARTERY BYPASS GRAFT  04/2014  . INTRAMEDULLARY (IM) NAIL INTERTROCHANTERIC Left 12/10/2015   Procedure: INTRAMEDULLARY (IM) NAIL INTERTROCHANTRIC;  Surgeon: Leandrew Koyanagi, MD;  Location: [facility];  Service: Orthopedics;  Laterality: Left;  . PLEURADESIS Left 12/03/2015   Procedure: PLEURADESIS;  Surgeon: Melrose Nakayama, MD;  Location: [facility];  Service: Thoracic;  Laterality: Left;  . RESECTION OF APICAL BLEB Left 12/03/2015   Procedure: BLEBECTOMY;  Surgeon: Melrose Nakayama, MD;  Location: [place];  Service: Thoracic;  Laterality: Left;  . THORACOSCOPY  12/03/2015  . VALVE REPLACEMENT    . VIDEO ASSISTED THORACOSCOPY Left  12/03/2015   Procedure: VIDEO ASSISTED THORACOSCOPY;  Surgeon: Melrose Nakayama, MD;  Location: [place] [facility];  Service: Thoracic;  Laterality: Left;   Past Medical History:  Diagnosis Date  . Allergy   . Aortic stenosis    Status post bioprosthetic AVR  . Arthritis    DJD  . Asthma   . Chronic systolic CHF (congestive heart failure) (HCC)    EF 30% 04/2014  . Coronary artery disease   . Gastritis   . Hyperlipidemia   . Hypertension   . Hyperthyroidism   . Multiple thyroid nodules   . Osteoporosis   . Paroxysmal atrial fibrillation (HCC)   . Pelvis fracture ([place]) 08/19/2015   MULTIPLE    . Spontaneous pneumothorax 11/28/2015   left   . Stroke [facility])    There were no vitals taken for this visit.  Opioid Risk Score:   Fall Risk Score:  `1  Depression screen PHQ 2/9  Depression screen [facility] 2/9 12/07/2016 05/20/2016 01/02/2016 12/23/2015  Decreased Interest 0 0 0 0  Down, Depressed, Hopeless 0 0 0 0  PHQ - 2 Score 0 0 0 0  Altered sleeping 2 - - -  Tired, decreased energy 2 - - -  Change in appetite 2 - - -  Feeling bad or failure about yourself  0 - - -  Trouble concentrating 2 - - -  Moving slowly or fidgety/restless 2 - - -  Suicidal thoughts 0 - - -  PHQ-9 Score 10 - - -  Difficult doing work/chores Not difficult at all - - -   ' Review of Systems  Constitutional: Positive for unexpected weight change.  HENT: Negative.   Eyes: Negative.   Respiratory: Positive for shortness of breath.   Cardiovascular: Negative.   Gastrointestinal: Negative.   Endocrine: Negative.   Genitourinary: Negative.   Musculoskeletal: Positive for arthralgias, back pain and gait problem.  Skin: Negative.   Allergic/Immunologic: Negative.   Neurological: Positive for dizziness.  Hematological: Negative.   Psychiatric/Behavioral: Negative.        Objective:   Physical Exam  Constitutional: She is oriented to person, place, and time. She appears well-developed and well-nourished.  HENT:  Head: Normocephalic and atraumatic.  Eyes: Pupils are equal, round, and reactive to light. Conjunctivae and EOM are normal.  Neck: Normal range of motion.  Cardiovascular: Normal rate and regular rhythm.  Exam reveals no friction rub.   No murmur heard. Pulmonary/Chest: Effort normal and breath sounds normal. No respiratory distress. She has no wheezes.  Abdominal: Soft. Bowel sounds are normal. She exhibits no distension. There is no tenderness.  Musculoskeletal:  Pain with bilateral hip internal and external rotation. However, the pain is at the knee rather than at the hip. Negative  straight leg raising. No pain with ankle range of motion. There is crepitus with bilateral knee flexion, extension, but the patient does has full range. She can stand without assistance, mild tenderness. Palpation lumbar paraspinal starting at L4-S1.   Neurological: She is alert and oriented to person, place, and time. No sensory deficit. Gait abnormal.  Reflex Scores:      Patellar reflexes are 0 on the right side and 0 on the left side.      Achilles reflexes are 0 on the right side and 0 on the left side. Motor strength is 4/5 right deltoid 5/5. Left deltoid 5/5 bilateral biceps, triceps, grip 5/5 bilateral hip flexor, hip adductor. 5. Bilateral knee extensors and ankle dorsiflexors.   Psychiatric:  She has a normal mood and affect.  Nursing note and vitals reviewed.  Good hip range of motion. No pain with hip range of motion There is pain with knee flexion and extension. Pain with lumbar extension greater than with lumbar flexion. She has 75% lumbar flexion, but 0-25% lumbar extension      Assessment & Plan:  1. Chronic low back pain after a fall approximately 1 year ago. This fall caused a left hip fracture which has healed. However, she has had persistent low back pain without sciatica, recommend x-rays Physical therapy to help address flexibility and core strength If no improvement in 1 month. Would do bilateral  medial branch blocks  2. Bilateral knee pain, probable osteoarthritis, has had cortical steroid injections as well as viscous supplementation, these have not been helpful. Also discussed Voltaren gel, which she did not try due to cost. Increase tramadol to 50 mg 3 times a day  3. Fever of falling, peripheral musculoskeletal balance disorder, also with history of stroke. October 2017 fall basically started her low back pain episode. She has osteoporosis, also on Coumadin. Will ask physical therapy to help with lower extremity strengthening, increase balance strategies

## 2016-12-07 NOTE — Patient Instructions (Signed)
Therapy Review xrays Consider xray guided back injection Continue tramadol increase to 3 a day

## 2016-12-13 ENCOUNTER — Other Ambulatory Visit: Payer: Self-pay | Admitting: Family Medicine

## 2016-12-13 ENCOUNTER — Ambulatory Visit
Admission: RE | Admit: 2016-12-13 | Discharge: 2016-12-13 | Disposition: A | Discharge status: Home / Self Care | Payer: Medicare Other | Source: Ambulatory Visit | Attending: Family Medicine | Admitting: Family Medicine

## 2016-12-13 DIAGNOSIS — N6320 Unspecified lump in the left breast, unspecified quadrant: Secondary | ICD-10-CM

## 2016-12-13 DIAGNOSIS — C50919 Malignant neoplasm of unspecified site of unspecified female breast: Secondary | ICD-10-CM

## 2016-12-13 DIAGNOSIS — N6323 Unspecified lump in the left breast, lower outer quadrant: Secondary | ICD-10-CM | POA: Diagnosis not present

## 2016-12-13 DIAGNOSIS — C50812 Malignant neoplasm of overlapping sites of left female breast: Secondary | ICD-10-CM | POA: Diagnosis not present

## 2016-12-13 DIAGNOSIS — N6321 Unspecified lump in the left breast, upper outer quadrant: Secondary | ICD-10-CM | POA: Diagnosis not present

## 2016-12-13 HISTORY — DX: Malignant neoplasm of unspecified site of unspecified female breast: C50.919

## 2016-12-15 ENCOUNTER — Ambulatory Visit: Payer: Medicare Other

## 2016-12-15 ENCOUNTER — Telehealth: Payer: Self-pay | Admitting: Hematology

## 2016-12-15 NOTE — Telephone Encounter (Signed)
Confirmed with patient [facility] appointment for 10/24 @ 815, will mail intake form to patient

## 2016-12-16 ENCOUNTER — Other Ambulatory Visit: Payer: Self-pay | Admitting: *Deleted

## 2016-12-16 ENCOUNTER — Other Ambulatory Visit (HOSPITAL_COMMUNITY): Payer: Medicare Other

## 2016-12-16 ENCOUNTER — Other Ambulatory Visit: Payer: Medicare Other

## 2016-12-16 ENCOUNTER — Telehealth: Payer: Self-pay | Admitting: *Deleted

## 2016-12-16 DIAGNOSIS — C50412 Malignant neoplasm of upper-outer quadrant of left female breast: Secondary | ICD-10-CM

## 2016-12-16 DIAGNOSIS — Z170 Estrogen receptor positive status [ER+]: Principal | ICD-10-CM

## 2016-12-16 NOTE — Telephone Encounter (Signed)
Confirmed [facility] appt for 12/22/16 at 815am.

## 2016-12-17 NOTE — Progress Notes (Signed)
[place]  [phone] Fax:([phone] [place] Note   Patient Care Team: [name], [name], MD as PCP - General (Family Medicine) 12/22/2016  CHIEF COMPLAINTS/PURPOSE OF CONSULTATION:  Malignant neoplasm of upper-outer quadrant of left breast in female, estrogen receptor positive    Oncology History   Cancer Staging Malignant neoplasm of upper-outer quadrant of left breast in female, estrogen receptor positive ([place]) Staging form: Breast, AJCC 8th Edition - Clinical stage from 12/13/2016: Stage IB (cT2, cN0, cM0, G2, ER: Positive, PR: Positive, HER2: Negative) - Signed by Danielle Merle, MD on 12/21/2016       Malignant neoplasm of upper-outer quadrant of left breast in female, estrogen receptor positive ([place])   12/06/2016 Mammogram    Diagnostic Mammogram 12/06/16 IMPRESSION:  Suspicious mass in the left breast at 3 o'clock 2 cm from the nipple measuring 1.9 x 1.1 x 2.2 cm. RECOMMENDATION: Ultrasound-guided core biopsy of the mass in the 3 o'clock region of the left breast is recommended. The biopsy will be scheduled at the patient's convenience.      12/13/2016 Initial Biopsy    Diagnosis 12/13/16 Breast, left, needle core biopsy, 3:00 o'clock, 2cmfn - INVASIVE DUCTAL CARCINOMA - SEE COMMENT      12/16/2016 Initial Diagnosis    Malignant neoplasm of upper-outer quadrant of left breast in female, estrogen receptor positive ([place])     12/17/2016 Receptors her2    Estrogen Receptor: 100%, POSITIVE, STRONG STAINING INTENSITY Progesterone Receptor: 100%, POSITIVE, STRONG STAINING INTENSITY Proliferation Marker Ki67: 30% HER2 - NEGATIVE        HISTORY OF PRESENTING ILLNESS: 12/22/16 Danielle [name] 71 y.o. female is here because of newly diagnosed left breast cancer. She presents to breast clinic today with. Her best friend and husband.   In the past she had left hip surgery and had a rod placed last year. She did  PT but will go back due to her trouble walking. She has arthritis and diagnosed with osteoporosis. She has been on Fosamax for the last 2 years. She does have back pain and knee pain arthritis. She had a stoke in 2000, she has CAD and Afib. She had open heart surgery and experienced another stroke during operation. She does experience SOB when she lays flat. She does have COPD and uses inhaler. Her mother, father had lung cancer and 2 sisters had cancer. She quit smoking in 2012 and had smoked on and off for 10 years.   Today she notes having left breast pain initially that prompted the mammogram. She also had nipple soreness but no discharge or other breast change.  She ambulates with a walker from hip surgery. She is able to take care of herself at home but her husband helps her significantly at home. She had hot flashes during her menopause and now they are gone.    GYN HISTORY  Menarchal: 11 LMP: at 71 yo Contraceptive: no HRT: no GP: G0    MEDICAL HISTORY:  Past Medical History:  Diagnosis Date  . Allergy   . Aortic stenosis    Status post bioprosthetic AVR  . Arthritis    DJD  . Asthma   . Chronic systolic CHF (congestive heart failure) (HCC)    EF 30% 04/2014  . Coronary artery disease   . Gastritis   . Hyperlipidemia   . Hypertension   . Hyperthyroidism   . Multiple thyroid nodules   . Osteoporosis   . Paroxysmal atrial fibrillation (HCC)   .  Pelvis fracture ([place]) 08/19/2015   MULTIPLE   . Spontaneous pneumothorax 11/28/2015   left   . Stroke [facility])     SURGICAL HISTORY: Past Surgical History:  Procedure Laterality Date  . CHEST TUBE INSERTION  10/2015  . CORONARY ARTERY BYPASS GRAFT  04/2014  . INTRAMEDULLARY (IM) NAIL INTERTROCHANTERIC Left 12/10/2015   Procedure: INTRAMEDULLARY (IM) NAIL INTERTROCHANTRIC;  Surgeon: Danielle Koyanagi, MD;  Location: [place];  Service: Orthopedics;  Laterality: Left;  . PLEURADESIS Left 12/03/2015   Procedure: PLEURADESIS;  Surgeon:  Danielle Nakayama, MD;  Location: [place];  Service: Thoracic;  Laterality: Left;  . RESECTION OF APICAL BLEB Left 12/03/2015   Procedure: BLEBECTOMY;  Surgeon: Danielle Nakayama, MD;  Location: [place];  Service: Thoracic;  Laterality: Left;  . THORACOSCOPY  12/03/2015  . VALVE REPLACEMENT    . VIDEO ASSISTED THORACOSCOPY Left 12/03/2015   Procedure: VIDEO ASSISTED THORACOSCOPY;  Surgeon: Danielle Nakayama, MD;  Location: [place];  Service: Thoracic;  Laterality: Left;    SOCIAL HISTORY: Social History   Social History  . Marital status: Married    Spouse name: Danielle [name]  . Number of children: 0  . Years of education: N/A   Occupational History  . Retired in 2004    Social History Main Topics  . Smoking status: Former Smoker    Packs/day: 0.25    Years: 10.00    Types: Cigarettes    Quit date: 03/01/2010  . Smokeless tobacco: Never Used  . Alcohol use No  . Drug use: No  . Sexual activity: Not on file   Other Topics Concern  . Not on file   Social History Narrative   Lives with husband.  Ambulated independently.    FAMILY HISTORY: Family History  Problem Relation Age of Onset  . Diabetes Mother   . Heart attack Mother 27  . Diabetes Father   . Lung cancer Father   . Diabetes Sister   . Thyroid disease Sister   . Diabetes Sister   . HIV Brother     ALLERGIES:  is allergic to lisinopril and tetanus toxoid adsorbed.  MEDICATIONS:  Current Outpatient Prescriptions  Medication Sig Dispense Refill  . albuterol (PROAIR HFA) 108 (90 Base) MCG/ACT inhaler Inhale 2 puffs into the lungs every 6 (six) hours as needed for wheezing or shortness of breath.     Marland Kitchen alendronate (FOSAMAX) 70 MG tablet Take 1 tablet (70 mg total) by mouth once a week. with a full glass of water on an empty stomach. 13 tablet 2  . atorvastatin (LIPITOR) 40 MG tablet Take 1 tablet (40 mg total) by mouth at bedtime. 90 tablet 2  . budesonide-formoterol (SYMBICORT) 160-4.5 MCG/ACT inhaler Inhale 2  puffs into the lungs 2 (two) times daily. 1 Inhaler 0  . carvedilol (COREG) 25 MG tablet Take 1 tablet (25 mg total) by mouth 2 (two) times daily. 180 tablet 1  . Cholecalciferol (VITAMIN D3) 400 units tablet Take 1 tablet (400 Units total) by mouth daily. 30 tablet 0  . diclofenac sodium (VOLTAREN) 1 % GEL Apply 4 [name] topically 4 (four) times daily. 500 [name] 3  . furosemide (LASIX) 20 MG tablet Take 1 tablet (20 mg total) by mouth daily as needed for fluid or edema. 90 tablet 1  . losartan (COZAAR) 25 MG tablet TAKE 1 TABLET BY MOUTH ONCE DAILY 90 tablet 2  . methimazole (TAPAZOLE) 5 MG tablet Take 3 tablets (15 mg total) by mouth every evening.  90 tablet 1  . ranitidine (ZANTAC) 150 MG tablet Take 1 tablet (150 mg total) by mouth 2 (two) times daily. 180 tablet 3  . senna-docusate (SENOKOT-S) 8.6-50 MG tablet Take 1 tablet by mouth 2 (two) times daily.     . traMADol (ULTRAM) 50 MG tablet Take 1 tablet (50 mg total) by mouth 3 (three) times daily. 90 tablet 1  . valACYclovir (VALTREX) 1000 MG tablet Take 1 tablet by mouth for 5 days. 10 tablet 0  . warfarin (COUMADIN) 3 MG tablet Take as directed by anticoagulation clinic. 40 tablet 2   No current facility-administered medications for this visit.     REVIEW OF SYSTEMS:   Constitutional: Denies fevers, chills or abnormal night sweats Eyes: Denies blurriness of vision, double vision or watery eyes Ears, nose, mouth, throat, and face: Denies mucositis or sore throat Respiratory: Denies cough,  Wheezes (+) SOB upon laying flat  Cardiovascular: Denies palpitation, chest discomfort or lower extremity swelling Gastrointestinal:  Denies nausea, heartburn or change in bowel habits Skin: Denies abnormal skin rashes Lymphatics: Denies new lymphadenopathy or easy bruising Neurological:Denies numbness, tingling or new weaknesses MSK: (+) Rod in left hip post surgery, pain 7/10 (+) arthritis in Knee (+) lower back pain. (+) ambulates with  walker Behavioral/Psych: Mood is stable, no new changes  All other systems were reviewed with the patient and are negative. BREAST: (+) left breast pain and nipple soreness   PHYSICAL EXAMINATION: ECOG PERFORMANCE STATUS: 2 - Symptomatic, <50% confined to bed  Vitals:   12/22/16 0921  BP: 135/90  Pulse: 80  Resp: 18  Temp: 98 F (36.7 C)  SpO2: 100%   Filed Weights   12/22/16 0921  Weight: 222 lb 3.2 oz (100.8 kg)    GENERAL:alert, no distress and comfortable SKIN: skin color, texture, turgor are normal, no rashes or significant lesions EYES: normal, conjunctiva are pink and non-injected, sclera clear OROPHARYNX:no exudate, no erythema and lips, buccal mucosa, and tongue normal  NECK: supple, thyroid normal size, non-tender, without nodularity LYMPH:  no palpable lymphadenopathy in the cervical, axillary or inguinal LUNGS: clear to auscultation and percussion with normal breathing effort HEART: regular rate & rhythm and no murmurs and no lower extremity edema ABDOMEN:abdomen soft, non-tender and normal bowel sounds Musculoskeletal:no cyanosis of digits and no clubbing  PSYCH: alert & oriented x 3 with fluent speech NEURO: no focal motor/sensory deficits BREAST: (+) normal right breast exam (+) Significant skin chymosins  on Left  LOQ of left breast, tenderneess, no palpable mass or adenopathy   LABORATORY DATA:  I have reviewed the data as listed CBC Latest Ref Rng & Units 12/22/2016 04/08/2016 12/13/2015  WBC 3.9 - 10.3 10e3/uL 5.4 6.1 12.9(H)  Hemoglobin 11.6 - 15.9 [name]/dL 13.2 13.1 8.5(L)  Hematocrit 34.8 - 46.6 % 40.0 37.9 25.6(L)  Platelets 145 - 400 10e3/uL 103(L) 136(L) 118(L)    CMP Latest Ref Rng & Units 12/22/2016 07/08/2016 04/08/2016  Glucose 70 - 140 mg/dl 90 85 81  BUN 7.0 - 26.0 mg/dL 19.'5 19 18  ' Creatinine 0.6 - 1.1 mg/dL 1.2(H) 0.98 1.05(H)  Sodium 136 - 145 mEq/L 143 140 140  Potassium 3.5 - 5.1 mEq/L 4.3 5.2 5.1  Chloride 96 - 106 mmol/L - 102 104  CO2  22 - 29 mEq/L '25 24 23  ' Calcium 8.4 - 10.4 mg/dL 9.8 10.1 9.9  Total Protein 6.4 - 8.3 [name]/dL 7.0 - -  Total Bilirubin 0.20 - 1.20 mg/dL 0.70 - -  Alkaline Phos  40 - 150 U/L 74 - -  AST 5 - 34 U/L 13 - -  ALT 0 - 55 U/L 9 - -    PATHOLOGY  Diagnosis 12/13/16 Breast, left, needle core biopsy, 3:00 o'clock, 2cmfn - INVASIVE DUCTAL CARCINOMA - SEE COMMENT Microscopic Comment Based on the biopsy, the carcinoma appears Nottingham grade 2 of 3 and measures 0.8 cm in greatest linear extent. Prognostic markers (ER/PR/ki-67/HER2-FISH) are pending and will be reported in an addendum. Dr. Saralyn Pilar has reviewed the case and agrees with above diagnosis. These results were called to [facility] on December 14, 2016. PROGNOSTIC INDICATORS Results: IMMUNOHISTOCHEMICAL AND MORPHOMETRIC ANALYSIS PERFORMED MANUALLY Estrogen Receptor: 100%, POSITIVE, STRONG STAINING INTENSITY Progesterone Receptor: 100%, POSITIVE, STRONG STAINING INTENSITY Proliferation Marker Ki67: 30% FLUORESCENCE IN-SITU HYBRIDIZATION Results: HER2 - NEGATIVE RATIO OF HER2/CEP17 SIGNALS 1.11 AVERAGE HER2 COPY NUMBER PER CELL 1.95     RADIOGRAPHIC STUDIES: I have personally reviewed the radiological images as listed and agreed with the findings in the report. US Breast Ltd Uni Left Inc Axilla  Result Date: 12/09/2016 CLINICAL DATA:  Patient complains of pain radiating from the lateral breast to the nipple. EXAM: 2D DIGITAL DIAGNOSTIC BILATERAL MAMMOGRAM WITH CAD AND ADJUNCT TOMO ULTRASOUND LEFT BREAST COMPARISON:  Prior exam from [place] dated 07/09/2015. ACR Breast Density Category c: The breast tissue is heterogeneously dense, which may obscure small masses. FINDINGS: In the far posterior lateral aspect of the left breast is a 1.8 cm mass. No suspicious mass or malignant type microcalcifications identified in the right breast. Mammographic images were processed with CAD. On physical exam, I  do not palpate a mass in lateral aspect of the left breast. Targeted ultrasound is performed, showing a hypoechoic mass in the left breast at 3 o'clock 2 cm from the nipple measuring 1.9 x 1.1 x 2.2 cm. Sonographic evaluation the left axilla does not show any enlarged adenopathy. IMPRESSION: Suspicious left breast mass. RECOMMENDATION: Ultrasound-guided core biopsy of the mass in the 3 o'clock region of the left breast is recommended. The biopsy will be scheduled at the patient's convenience. I have discussed the findings and recommendations with the patient. Results were also provided in writing at the conclusion of the visit. If applicable, a reminder letter will be sent to the patient regarding the next appointment. BI-RADS CATEGORY  4: Suspicious. Electronically Signed   By: Lillia Mountain M.D.   On: 12/06/2016 16:43   Mm Diag Breast Tomo Bilateral  Result Date: 12/06/2016 CLINICAL DATA:  Patient complains of pain radiating from the lateral breast to the nipple. EXAM: 2D DIGITAL DIAGNOSTIC BILATERAL MAMMOGRAM WITH CAD AND ADJUNCT TOMO ULTRASOUND LEFT BREAST COMPARISON:  Prior exam from [place] dated 07/09/2015. ACR Breast Density Category c: The breast tissue is heterogeneously dense, which may obscure small masses. FINDINGS: In the far posterior lateral aspect of the left breast is a 1.8 cm mass. No suspicious mass or malignant type microcalcifications identified in the right breast. Mammographic images were processed with CAD. On physical exam, I do not palpate a mass in lateral aspect of the left breast. Targeted ultrasound is performed, showing a hypoechoic mass in the left breast at 3 o'clock 2 cm from the nipple measuring 1.9 x 1.1 x 2.2 cm. Sonographic evaluation the left axilla does not show any enlarged adenopathy. IMPRESSION: Suspicious left breast mass. RECOMMENDATION: Ultrasound-guided core biopsy of the mass in the 3 o'clock region of the left breast is recommended. The biopsy  will be scheduled at the patient's convenience. I have discussed the findings and recommendations with the patient. Results were also provided in writing at the conclusion of the visit. If applicable, a reminder letter will be sent to the patient regarding the next appointment. BI-RADS CATEGORY  4: Suspicious. Electronically Signed   By: Lillia Mountain M.D.   On: 12/06/2016 16:43   Mm Clip Placement Left  Result Date: 12/13/2016 CLINICAL DATA:  Ultrasound-guided biopsy was performed of a suspicious left breast mass at 3 o'clock position. EXAM: DIAGNOSTIC LEFT MAMMOGRAM POST ULTRASOUND BIOPSY COMPARISON:  Previous exam(s). FINDINGS: Mammographic images were obtained following ultrasound guided biopsy of left breast mass 3 o'clock position, 2 cm from the nipple. A ribbon shaped biopsy clip is satisfactorily positioned within the mass. IMPRESSION: Satisfactory position of ribbon shaped biopsy clip. Final Assessment: Post Procedure Mammograms for Marker Placement Electronically Signed   By: Curlene Dolphin M.D.   On: 12/13/2016 15:59   [place] Lt Breast Bx W Loc Dev 1st Lesion Img Bx Spec US Guide  Addendum Date: 12/15/2016   ADDENDUM REPORT: 12/14/2016 14:01 ADDENDUM: Pathology revealed GRADE II INVASIVE DUCTAL CARCINOMA of the Left breast, 3:00 o'clock, 2 cmfn. This was found to be concordant by Dr. Curlene Dolphin. Pathology results were discussed with the patient by telephone. The patient reported doing well after the biopsy with tenderness at the site. Post biopsy instructions and care were reviewed and questions were answered. The patient was encouraged to call [facility] for any additional concerns. The patient was referred to [facility] on December 22, 2016. Pathology results reported by Terie Purser, RN on 12/14/2016. Electronically Signed   By: Curlene Dolphin M.D.   On: 12/14/2016 14:01   Result Date:  12/15/2016 CLINICAL DATA:  Suspicious mass at 3 o'clock position of the left breast. Biopsy was recommended. EXAM: ULTRASOUND GUIDED LEFT BREAST CORE NEEDLE BIOPSY COMPARISON:  Previous exam(s). FINDINGS: I met with the patient and we discussed the procedure of ultrasound-guided biopsy, including benefits and alternatives. We discussed the high likelihood of a successful procedure. We discussed the risks of the procedure, including infection, bleeding, tissue injury, clip migration, and inadequate sampling. Informed written consent was given. The usual time-out protocol was performed immediately prior to the procedure. Lesion quadrant: Lower outer quadrant Using sterile technique and 1% Lidocaine as local anesthetic, under direct ultrasound visualization, a 12 gauge spring-loaded device was used to perform biopsy of suspicious mass 3 o'clock position left breast using a lateral to medial approach. At the conclusion of the procedure a ribbon shaped tissue marker clip was deployed into the biopsy cavity. Follow up 2 view mammogram was performed and dictated separately. IMPRESSION: Ultrasound guided biopsy of the left breast. No apparent complications. Electronically Signed: By: Curlene Dolphin M.D. On: 12/13/2016 16:05    ASSESSMENT & PLAN:  Joella Saefong is a 71 y.o. African-american female with a history of Aortic Stenosis, Arthritis, Asthma/COPD, CAD, CHF history of Stroke, Osteoporosis, HTN, HLD, Hyperthyroidism, and Afib.   1. Malignant neoplasm of upper-outer quadrant of left breast, Stage IB, c(T2,N0,M0 ), ER/PR: POSITIVE, HER2: NEGATIVE, Grade II --We discussed her imaging findings and the biopsy results in great details. -Giving the early stage disease, she likely is a candidate for lumpectomy nd a sentinel lymph node biopsy. She is agreeable with that. She was seen by Dr. Donne Hazel today and likely will proceed with surgery soon.  -we discussed  the risk of cancer recurrence after  completing surgical resection, which is indeterminate but her stage and biology of her tumor. -I recommend a Oncotype Dx test on the surgical sample and we'll make a decision about adjuvant chemotherapy based on the Oncotype result. Written material of this test was given to her. Due to her age and multiple comorbidities, she may not be a good candidate for intensive chemotherapy. However, she is interested in knowing her risk of recurrence anyway. -If her surgical sentinel lymph node node positive, I recommend mammaprint for further risk stratification and guide adjuvant chemotherapy. -Giving the strong ER and PR expression in her postmenopausal status, I recommend adjuvant endocrine therapy with aromatase inhibitor or tamoxifen for a total of 5-10 years to reduce the risk of cancer recurrence. Potential benefits and side effects were discussed with patient and she is interested. Based on her Osteoporosis and significant arthritis and coumadin use I suggest Tamoxifen, which she may tolerate better than AI   -She was also seen by radiation oncologist Dr. Lisbeth Renshaw today. If her surgical sentinel lymph nodes were negative, she would not need post mastectomy radiation.  -We also discussed the breast cancer surveillance after her surgery. She will continue annual screening mammogram, self exam, and a routine office visit with lab and exam with [place]. -I encouraged her to have healthy diet and exercise regularly.  -F/u after surgery if Oncytpe results high risk or after radiation.    2. Osteoporosis, Arthritis  -Underwent Left Hip surgery with rod placement in 2017, ambulates with walker -On Fosamax    3. CAD, Afib, CHF, Aortic Stenosis, H/o Stroke  -On Coreg, Coumadin -follow-up with her primary care physician   PLAN:  -she will proceed with breast surgery -Oncotype on her surgical sample, or mammaprint if sentinel lymph node positive. -F/u after surgery if Oncytpe results high risk or after radiation  if low risk disease.    No orders of the defined types were placed in this encounter.   All questions were answered. The patient knows to call the clinic with any problems, questions or concerns. I spent 55 minutes counseling the patient face to face. The total time spent in the appointment was 60 minutes and more than 50% was on counseling.     Danielle Merle, MD 12/22/2016    This document serves as a record of services personally performed by Danielle Merle, MD. It was created on her behalf by Joslyn Devon, a trained medical scribe. The creation of this record is based on the scribe's personal observations and the provider's statements to them. This document has been checked and approved by the attending provider.

## 2016-12-20 ENCOUNTER — Ambulatory Visit: Payer: Medicare Other

## 2016-12-22 ENCOUNTER — Encounter: Payer: Self-pay | Admitting: Hematology

## 2016-12-22 ENCOUNTER — Encounter: Payer: Self-pay | Admitting: Physical Therapy

## 2016-12-22 ENCOUNTER — Ambulatory Visit: Payer: Medicare Other | Attending: General Surgery | Admitting: Physical Therapy

## 2016-12-22 ENCOUNTER — Ambulatory Visit (HOSPITAL_BASED_OUTPATIENT_CLINIC_OR_DEPARTMENT_OTHER): Payer: Medicare Other | Admitting: Hematology

## 2016-12-22 ENCOUNTER — Telehealth: Payer: Self-pay | Admitting: Family Medicine

## 2016-12-22 ENCOUNTER — Ambulatory Visit
Admission: RE | Admit: 2016-12-22 | Discharge: 2016-12-22 | Disposition: A | Discharge status: Home / Self Care | Payer: Medicare Other | Source: Ambulatory Visit | Attending: Radiation Oncology | Admitting: Radiation Oncology

## 2016-12-22 ENCOUNTER — Other Ambulatory Visit (HOSPITAL_BASED_OUTPATIENT_CLINIC_OR_DEPARTMENT_OTHER): Payer: Medicare Other

## 2016-12-22 ENCOUNTER — Other Ambulatory Visit: Payer: Self-pay

## 2016-12-22 ENCOUNTER — Other Ambulatory Visit: Payer: Self-pay | Admitting: General Surgery

## 2016-12-22 ENCOUNTER — Encounter: Payer: Self-pay | Admitting: *Deleted

## 2016-12-22 VITALS — BP 135/90 | HR 80 | Temp 98.0°F | Resp 18 | Ht 67.0 in | Wt 222.2 lb

## 2016-12-22 DIAGNOSIS — M1710 Unilateral primary osteoarthritis, unspecified knee: Secondary | ICD-10-CM | POA: Diagnosis not present

## 2016-12-22 DIAGNOSIS — M25561 Pain in right knee: Secondary | ICD-10-CM | POA: Insufficient documentation

## 2016-12-22 DIAGNOSIS — Z833 Family history of diabetes mellitus: Secondary | ICD-10-CM | POA: Insufficient documentation

## 2016-12-22 DIAGNOSIS — R262 Difficulty in walking, not elsewhere classified: Secondary | ICD-10-CM

## 2016-12-22 DIAGNOSIS — Z87891 Personal history of nicotine dependence: Secondary | ICD-10-CM | POA: Insufficient documentation

## 2016-12-22 DIAGNOSIS — M545 Low back pain: Secondary | ICD-10-CM | POA: Diagnosis not present

## 2016-12-22 DIAGNOSIS — C50412 Malignant neoplasm of upper-outer quadrant of left female breast: Secondary | ICD-10-CM | POA: Insufficient documentation

## 2016-12-22 DIAGNOSIS — Z801 Family history of malignant neoplasm of trachea, bronchus and lung: Secondary | ICD-10-CM

## 2016-12-22 DIAGNOSIS — M25552 Pain in left hip: Secondary | ICD-10-CM | POA: Diagnosis not present

## 2016-12-22 DIAGNOSIS — C50512 Malignant neoplasm of lower-outer quadrant of left female breast: Secondary | ICD-10-CM

## 2016-12-22 DIAGNOSIS — Z79899 Other long term (current) drug therapy: Secondary | ICD-10-CM | POA: Insufficient documentation

## 2016-12-22 DIAGNOSIS — M6281 Muscle weakness (generalized): Secondary | ICD-10-CM | POA: Diagnosis not present

## 2016-12-22 DIAGNOSIS — Z170 Estrogen receptor positive status [ER+]: Secondary | ICD-10-CM | POA: Diagnosis not present

## 2016-12-22 DIAGNOSIS — Z8673 Personal history of transient ischemic attack (TIA), and cerebral infarction without residual deficits: Secondary | ICD-10-CM | POA: Insufficient documentation

## 2016-12-22 DIAGNOSIS — M25612 Stiffness of left shoulder, not elsewhere classified: Secondary | ICD-10-CM | POA: Diagnosis not present

## 2016-12-22 DIAGNOSIS — J449 Chronic obstructive pulmonary disease, unspecified: Secondary | ICD-10-CM | POA: Insufficient documentation

## 2016-12-22 DIAGNOSIS — M810 Age-related osteoporosis without current pathological fracture: Secondary | ICD-10-CM

## 2016-12-22 DIAGNOSIS — Z7901 Long term (current) use of anticoagulants: Secondary | ICD-10-CM

## 2016-12-22 DIAGNOSIS — I5022 Chronic systolic (congestive) heart failure: Secondary | ICD-10-CM | POA: Insufficient documentation

## 2016-12-22 DIAGNOSIS — I4891 Unspecified atrial fibrillation: Secondary | ICD-10-CM

## 2016-12-22 DIAGNOSIS — I10 Essential (primary) hypertension: Secondary | ICD-10-CM | POA: Diagnosis not present

## 2016-12-22 DIAGNOSIS — Z7982 Long term (current) use of aspirin: Secondary | ICD-10-CM | POA: Insufficient documentation

## 2016-12-22 DIAGNOSIS — R293 Abnormal posture: Secondary | ICD-10-CM | POA: Diagnosis not present

## 2016-12-22 DIAGNOSIS — I110 Hypertensive heart disease with heart failure: Secondary | ICD-10-CM | POA: Insufficient documentation

## 2016-12-22 DIAGNOSIS — M25562 Pain in left knee: Secondary | ICD-10-CM | POA: Diagnosis not present

## 2016-12-22 DIAGNOSIS — G8929 Other chronic pain: Secondary | ICD-10-CM

## 2016-12-22 DIAGNOSIS — Z510 Encounter for antineoplastic radiation therapy: Secondary | ICD-10-CM | POA: Insufficient documentation

## 2016-12-22 DIAGNOSIS — Z9889 Other specified postprocedural states: Secondary | ICD-10-CM | POA: Insufficient documentation

## 2016-12-22 LAB — CBC WITH DIFFERENTIAL/PLATELET
BASO%: 0.4 % (ref 0.0–2.0)
Basophils Absolute: 0 10*3/uL (ref 0.0–0.1)
EOS%: 4.3 % (ref 0.0–7.0)
Eosinophils Absolute: 0.2 10*3/uL (ref 0.0–0.5)
HCT: 40 % (ref 34.8–46.6)
HEMOGLOBIN: 13.2 g/dL (ref 11.6–15.9)
LYMPH%: 20.8 % (ref 14.0–49.7)
MCH: 31.7 pg (ref 25.1–34.0)
MCHC: 33 g/dL (ref 31.5–36.0)
MCV: 96.2 fL (ref 79.5–101.0)
MONO#: 0.4 10*3/uL (ref 0.1–0.9)
MONO%: 6.7 % (ref 0.0–14.0)
NEUT%: 67.8 % (ref 38.4–76.8)
NEUTROS ABS: 3.7 10*3/uL (ref 1.5–6.5)
Platelets: 103 10*3/uL — ABNORMAL LOW (ref 145–400)
RBC: 4.16 10*6/uL (ref 3.70–5.45)
RDW: 13.3 % (ref 11.2–14.5)
WBC: 5.4 10*3/uL (ref 3.9–10.3)
lymph#: 1.1 10*3/uL (ref 0.9–3.3)
nRBC: 0 % (ref 0–0)

## 2016-12-22 LAB — POCT INR: INR: 2.1

## 2016-12-22 LAB — COMPREHENSIVE METABOLIC PANEL
ALK PHOS: 74 U/L (ref 40–150)
ALT: 9 U/L (ref 0–55)
ANION GAP: 9 meq/L (ref 3–11)
AST: 13 U/L (ref 5–34)
Albumin: 3.8 g/dL (ref 3.5–5.0)
BILIRUBIN TOTAL: 0.7 mg/dL (ref 0.20–1.20)
BUN: 19.5 mg/dL (ref 7.0–26.0)
CO2: 25 meq/L (ref 22–29)
Calcium: 9.8 mg/dL (ref 8.4–10.4)
Chloride: 110 mEq/L — ABNORMAL HIGH (ref 98–109)
Creatinine: 1.2 mg/dL — ABNORMAL HIGH (ref 0.6–1.1)
EGFR: 55 mL/min/{1.73_m2} — AB (ref >60)
GLUCOSE: 90 mg/dL (ref 70–140)
POTASSIUM: 4.3 meq/L (ref 3.5–5.1)
SODIUM: 143 meq/L (ref 136–145)
Total Protein: 7 g/dL (ref 6.4–8.3)

## 2016-12-22 NOTE — Progress Notes (Signed)
Radiation Oncology         [phone] ________________________________  Name: Danielle [name]        MRN: [id number]  Date of Service: 12/22/2016 DOB: 1945/11/03  UE:KCMKLK, [name], MD  Rolm Bookbinder, MD     REFERRING PHYSICIAN: Rolm Bookbinder, MD   DIAGNOSIS: The encounter diagnosis was Malignant neoplasm of upper-outer quadrant of left breast in female, estrogen receptor positive ([place]).   HISTORY OF PRESENT ILLNESS: Danielle [name] is a 71 y.o. female seen in the multidisciplinary breast clinic for a new diagnosis of left breast cancer. The patient was noted to have pain in her left breast along the nipple area. She has a history of spontaneous pneumothorax of the left lung which required procedures in the past to stabilize. She was seen for diagnostic imaging and in the posterior aspect of the breast there was a mass. On ultrasound this mass measured 2.2 x 1.9 x 1.1 cm at 3:00. And the axilla was negative for adenopathy. She underwent a biopsy on  12/13/16 revealed a grade 2, invasive ductal carcinoma, that was ER/PR positive, HER2 negative. She comes today to discuss treatment options for her cancer.    PREVIOUS RADIATION THERAPY: No   PAST MEDICAL HISTORY:  Past Medical History:  Diagnosis Date  . Allergy   . Aortic stenosis    Status post bioprosthetic AVR  . Arthritis    DJD  . Asthma   . Chronic systolic CHF (congestive heart failure) (HCC)    EF 30% 04/2014  . Coronary artery disease   . Gastritis   . Hyperlipidemia   . Hypertension   . Hyperthyroidism   . Multiple thyroid nodules   . Osteoporosis   . Paroxysmal atrial fibrillation (HCC)   . Pelvis fracture ([place]) 08/19/2015   MULTIPLE   . Spontaneous pneumothorax 11/28/2015   left   . Stroke [facility])        PAST SURGICAL HISTORY: Past Surgical History:  Procedure Laterality Date  . CHEST TUBE INSERTION  10/2015  . CORONARY ARTERY BYPASS GRAFT  04/2014  . INTRAMEDULLARY  (IM) NAIL INTERTROCHANTERIC Left 12/10/2015   Procedure: INTRAMEDULLARY (IM) NAIL INTERTROCHANTRIC;  Surgeon: Leandrew Koyanagi, MD;  Location: [facility];  Service: Orthopedics;  Laterality: Left;  . PLEURADESIS Left 12/03/2015   Procedure: PLEURADESIS;  Surgeon: Melrose Nakayama, MD;  Location: [facility];  Service: Thoracic;  Laterality: Left;  . RESECTION OF APICAL BLEB Left 12/03/2015   Procedure: BLEBECTOMY;  Surgeon: Melrose Nakayama, MD;  Location: [facility];  Service: Thoracic;  Laterality: Left;  . THORACOSCOPY  12/03/2015  . VALVE REPLACEMENT    . VIDEO ASSISTED THORACOSCOPY Left 12/03/2015   Procedure: VIDEO ASSISTED THORACOSCOPY;  Surgeon: Melrose Nakayama, MD;  Location: [facility];  Service: Thoracic;  Laterality: Left;     FAMILY HISTORY:  Family History  Problem Relation Age of Onset  . Diabetes Mother   . Heart attack Mother 12  . Diabetes Father   . Lung cancer Father   . Diabetes Sister   . Thyroid disease Sister   . Diabetes Sister   . HIV Brother      SOCIAL HISTORY:  reports that she quit smoking about 6 years ago. Her smoking use included Cigarettes. She has a 2.50 pack-year smoking history. She has never used smokeless tobacco. She reports that she does not drink alcohol or use drugs. The patient is married and lives in [place]. She   ALLERGIES: Lisinopril and Tetanus toxoid adsorbed  MEDICATIONS:  Current Outpatient Prescriptions  Medication Sig Dispense Refill  . albuterol (PROAIR HFA) 108 (90 Base) MCG/ACT inhaler Inhale 2 puffs into the lungs every 6 (six) hours as needed for wheezing or shortness of breath.     Marland Kitchen alendronate (FOSAMAX) 70 MG tablet Take 1 tablet (70 mg total) by mouth once a week. with a full glass of water on an empty stomach. 13 tablet 2  . atorvastatin (LIPITOR) 40 MG tablet Take 1 tablet (40 mg total) by mouth at bedtime. 90 tablet 2  . budesonide-formoterol (SYMBICORT) 160-4.5 MCG/ACT inhaler Inhale 2 puffs into the lungs 2 (two) times  daily. 1 Inhaler 0  . carvedilol (COREG) 25 MG tablet Take 1 tablet (25 mg total) by mouth 2 (two) times daily. 180 tablet 1  . Cholecalciferol (VITAMIN D3) 400 units tablet Take 1 tablet (400 Units total) by mouth daily. 30 tablet 0  . diclofenac sodium (VOLTAREN) 1 % GEL Apply 4 g topically 4 (four) times daily. 500 g 3  . furosemide (LASIX) 20 MG tablet Take 1 tablet (20 mg total) by mouth daily as needed for fluid or edema. 90 tablet 1  . losartan (COZAAR) 25 MG tablet TAKE 1 TABLET BY MOUTH ONCE DAILY 90 tablet 2  . methimazole (TAPAZOLE) 5 MG tablet Take 3 tablets (15 mg total) by mouth every evening. 90 tablet 1  . ranitidine (ZANTAC) 150 MG tablet Take 1 tablet (150 mg total) by mouth 2 (two) times daily. 180 tablet 3  . senna-docusate (SENOKOT-S) 8.6-50 MG tablet Take 1 tablet by mouth 2 (two) times daily.     . traMADol (ULTRAM) 50 MG tablet Take 1 tablet (50 mg total) by mouth 3 (three) times daily. 90 tablet 1  . valACYclovir (VALTREX) 1000 MG tablet Take 1 tablet by mouth for 5 days. 10 tablet 0  . warfarin (COUMADIN) 3 MG tablet Take as directed by anticoagulation clinic. 40 tablet 2   No current facility-administered medications for this encounter.      REVIEW OF SYSTEMS: On review of systems, the patient reports that she is doing well overall. She denies any chest pain, shortness of breath, cough, fevers, chills, night sweats, unintended weight changes. She denies any bowel or bladder disturbances, and denies abdominal pain, nausea or vomiting. She denies any new musculoskeletal or joint aches or pains. A complete review of systems is obtained and is otherwise negative.     PHYSICAL EXAM:  Wt Readings from Last 3 Encounters:  12/22/16 222 lb 3.2 oz (100.8 kg)  12/03/16 223 lb (101.2 kg)  10/19/16 225 lb 2 oz (102.1 kg)   Temp Readings from Last 3 Encounters:  12/22/16 98 F (36.7 C) (Oral)  12/15/15 (!) (P) 100.7 F (38.2 C)  12/09/15 99.2 F (37.3 C) (Oral)   BP  Readings from Last 3 Encounters:  12/22/16 135/90  12/03/16 124/64  10/19/16 120/80   Pulse Readings from Last 3 Encounters:  12/22/16 80  12/03/16 64  10/19/16 71     In general this is a well appearing African American female in no acute distress. She is alert and oriented x4 and appropriate throughout the examination. HEENT reveals that the patient is normocephalic, atraumatic. EOMs are intact. PERRLA. Skin is intact without any evidence of gross lesions. Cardiovascular exam reveals a regular rate and rhythm, no clicks rubs or murmurs are auscultated. Chest is clear to auscultation bilaterally. Lymphatic assessment is performed and does not reveal any adenopathy in the cervical, supraclavicular, axillary,  or inguinal chains. Bilateral breast exam is performed and reveals bruising of the left breast without palpable mass but induration deep to the biopsy site. No mass is noted in the right breast. No nipple bleeding or discharge is noted of either breast. Abdomen has active bowel sounds in all quadrants and is intact. The abdomen is soft, non tender, non distended. Lower extremities are negative for pretibial pitting edema, deep calf tenderness, cyanosis or clubbing.   ECOG = 0  0 - Asymptomatic (Fully active, able to carry on all predisease activities without restriction)  1 - Symptomatic but completely ambulatory (Restricted in physically strenuous activity but ambulatory and able to carry out work of a light or sedentary nature. For example, light housework, office work)  2 - Symptomatic, <50% in bed during the day (Ambulatory and capable of all self care but unable to carry out any work activities. Up and about more than 50% of waking hours)  3 - Symptomatic, >50% in bed, but not bedbound (Capable of only limited self-care, confined to bed or chair 50% or more of waking hours)  4 - Bedbound (Completely disabled. Cannot carry on any self-care. Totally confined to bed or chair)  5 -  Death   Eustace Pen MM, Creech RH, Tormey DC, et al. [phone]). "Toxicity and response criteria of the [facility]". [place] Oncol. 5 (6): 649-55    LABORATORY DATA:  Lab Results  Component Value Date   WBC 5.4 12/22/2016   HGB 13.2 12/22/2016   HCT 40.0 12/22/2016   MCV 96.2 12/22/2016   PLT 103 (L) 12/22/2016   Lab Results  Component Value Date   NA 143 12/22/2016   K 4.3 12/22/2016   CL 102 07/08/2016   CO2 25 12/22/2016   Lab Results  Component Value Date   ALT 9 12/22/2016   AST 13 12/22/2016   ALKPHOS 74 12/22/2016   BILITOT 0.70 12/22/2016      RADIOGRAPHY: US Breast Ltd Uni Left Inc Axilla  Result Date: 12/09/2016 CLINICAL DATA:  Patient complains of pain radiating from the lateral breast to the nipple. EXAM: 2D DIGITAL DIAGNOSTIC BILATERAL MAMMOGRAM WITH CAD AND ADJUNCT TOMO ULTRASOUND LEFT BREAST COMPARISON:  Prior exam from [facility] dated 07/09/2015. ACR Breast Density Category c: The breast tissue is heterogeneously dense, which may obscure small masses. FINDINGS: In the far posterior lateral aspect of the left breast is a 1.8 cm mass. No suspicious mass or malignant type microcalcifications identified in the right breast. Mammographic images were processed with CAD. On physical exam, I do not palpate a mass in lateral aspect of the left breast. Targeted ultrasound is performed, showing a hypoechoic mass in the left breast at 3 o'clock 2 cm from the nipple measuring 1.9 x 1.1 x 2.2 cm. Sonographic evaluation the left axilla does not show any enlarged adenopathy. IMPRESSION: Suspicious left breast mass. RECOMMENDATION: Ultrasound-guided core biopsy of the mass in the 3 o'clock region of the left breast is recommended. The biopsy will be scheduled at the patient's convenience. I have discussed the findings and recommendations with the patient. Results were also provided in writing at the conclusion of the visit. If applicable, a  reminder letter will be sent to the patient regarding the next appointment. BI-RADS CATEGORY  4: Suspicious. Electronically Signed   By: Lillia Mountain M.D.   On: 12/06/2016 16:43   Mm Diag Breast Tomo Bilateral  Result Date: 12/06/2016 CLINICAL DATA:  Patient complains of pain  radiating from the lateral breast to the nipple. EXAM: 2D DIGITAL DIAGNOSTIC BILATERAL MAMMOGRAM WITH CAD AND ADJUNCT TOMO ULTRASOUND LEFT BREAST COMPARISON:  Prior exam from [facility] dated 07/09/2015. ACR Breast Density Category c: The breast tissue is heterogeneously dense, which may obscure small masses. FINDINGS: In the far posterior lateral aspect of the left breast is a 1.8 cm mass. No suspicious mass or malignant type microcalcifications identified in the right breast. Mammographic images were processed with CAD. On physical exam, I do not palpate a mass in lateral aspect of the left breast. Targeted ultrasound is performed, showing a hypoechoic mass in the left breast at 3 o'clock 2 cm from the nipple measuring 1.9 x 1.1 x 2.2 cm. Sonographic evaluation the left axilla does not show any enlarged adenopathy. IMPRESSION: Suspicious left breast mass. RECOMMENDATION: Ultrasound-guided core biopsy of the mass in the 3 o'clock region of the left breast is recommended. The biopsy will be scheduled at the patient's convenience. I have discussed the findings and recommendations with the patient. Results were also provided in writing at the conclusion of the visit. If applicable, a reminder letter will be sent to the patient regarding the next appointment. BI-RADS CATEGORY  4: Suspicious. Electronically Signed   By: Lillia Mountain M.D.   On: 12/06/2016 16:43   Mm Clip Placement Left  Result Date: 12/13/2016 CLINICAL DATA:  Ultrasound-guided biopsy was performed of a suspicious left breast mass at 3 o'clock position. EXAM: DIAGNOSTIC LEFT MAMMOGRAM POST ULTRASOUND BIOPSY COMPARISON:  Previous exam(s). FINDINGS: Mammographic  images were obtained following ultrasound guided biopsy of left breast mass 3 o'clock position, 2 cm from the nipple. A ribbon shaped biopsy clip is satisfactorily positioned within the mass. IMPRESSION: Satisfactory position of ribbon shaped biopsy clip. Final Assessment: Post Procedure Mammograms for Marker Placement Electronically Signed   By: Curlene Dolphin M.D.   On: 12/13/2016 15:59   [place] Lt Breast Bx W Loc Dev 1st Lesion Img Bx Spec US Guide  Addendum Date: 12/15/2016   ADDENDUM REPORT: 12/14/2016 14:01 ADDENDUM: Pathology revealed GRADE II INVASIVE DUCTAL CARCINOMA of the Left breast, 3:00 o'clock, 2 cmfn. This was found to be concordant by Dr. Curlene Dolphin. Pathology results were discussed with the patient by telephone. The patient reported doing well after the biopsy with tenderness at the site. Post biopsy instructions and care were reviewed and questions were answered. The patient was encouraged to call [facility] for any additional concerns. The patient was referred to [facility] on December 22, 2016. Pathology results reported by Terie Purser, RN on 12/14/2016. Electronically Signed   By: Curlene Dolphin M.D.   On: 12/14/2016 14:01   Result Date: 12/15/2016 CLINICAL DATA:  Suspicious mass at 3 o'clock position of the left breast. Biopsy was recommended. EXAM: ULTRASOUND GUIDED LEFT BREAST CORE NEEDLE BIOPSY COMPARISON:  Previous exam(s). FINDINGS: I met with the patient and we discussed the procedure of ultrasound-guided biopsy, including benefits and alternatives. We discussed the high likelihood of a successful procedure. We discussed the risks of the procedure, including infection, bleeding, tissue injury, clip migration, and inadequate sampling. Informed written consent was given. The usual time-out protocol was performed immediately prior to the procedure. Lesion quadrant: Lower outer  quadrant Using sterile technique and 1% Lidocaine as local anesthetic, under direct ultrasound visualization, a 12 gauge spring-loaded device was used to perform biopsy of suspicious mass 3 o'clock position left  breast using a lateral to medial approach. At the conclusion of the procedure a ribbon shaped tissue marker clip was deployed into the biopsy cavity. Follow up 2 view mammogram was performed and dictated separately. IMPRESSION: Ultrasound guided biopsy of the left breast. No apparent complications. Electronically Signed: By: Curlene Dolphin M.D. On: 12/13/2016 16:05       IMPRESSION/PLAN: 1. Stage IB, cT2N0M0, grade 2 ER/PR positive invasive ductal carcinoma of the left breast. Dr. Lisbeth Renshaw discusses the pathology findings and reviews the nature of early stage invasive breast disease. The consensus from the breast conference include breast conservation with lumpectomy with  sentinel mapping. Her tumor will be tested for oncotype dx score to determine a role for systemic therapy. Provided that chemotherapy is not indicated, the patient's course would then be followed by external radiotherapy to the breast followed by antiestrogen therapy. We discussed the risks, benefits, short, and long term effects of radiotherapy, and the patient is interested in proceeding. Dr. Lisbeth Renshaw discusses the delivery and logistics of radiotherapy and anticipates a course of 4 weeks. We will see her back about 2 weeks after surgery to move forward with the simulation and planning process and anticipate starting radiotherapy about 4 weeks after surgery.    The above documentation reflects my direct findings during this shared patient visit. Please see the separate note by Dr. Lisbeth Renshaw on this date for the remainder of the patient's plan of care.    Carola Rhine, PAC

## 2016-12-22 NOTE — Telephone Encounter (Signed)
Rhonda at the cancer center called to request an order be put in for PT INR for pt. Pt was there this am, and was supposed to have this done, but there was not an order. They need order so they can send this out to be done.

## 2016-12-22 NOTE — Progress Notes (Signed)
Nutrition Assessment  Reason for Assessment:  Pt seen in Breast Clinic  ASSESSMENT:   71 year old female with new diagnosis of breast cancer.  Past medical history of a fib, HTN, CAD, stroke, COPD, HLD.  Patient reports normal appetite.  Medications:  reviewed  Labs: reviewed  Anthropometrics:   Height: 67 inches Weight: 223 lb BMI: 34   NUTRITION DIAGNOSIS: Food and nutrition related knowledge deficit related to new diagnosis of breast cancer as evidenced by no prior need for nutrition related information.  INTERVENTION:   Discussed and provided packet of information regarding nutritional tips for breast cancer patients.  Questions answered.  Teachback method used.  Contact information provided and patient knows to contact me with questions/concerns.    MONITORING, EVALUATION, and GOAL: Pt will consume a healthy plant based diet to maintain lean body mass throughout treatment.   Tiffay Pinette B. Zenia Resides, [place], [place] Registered Dietitian [phone] (pager)

## 2016-12-22 NOTE — Patient Instructions (Signed)
Physical Therapy Information for After Breast Cancer Surgery/Treatment:   Lymphedema is a swelling condition that you may be at risk for in your arm if you have lymph nodes removed from the armpit area.  After a sentinel node biopsy, the risk is approximately 5-9% and is higher after an axillary node dissection.  There is treatment available for this condition and it is not life-threatening.  Contact your physician or physical therapist with concerns.  You may begin the 4 shoulder/posture exercises (see additional sheet) when permitted by your physician (typically a week after surgery).  If you have drains, you may need to wait until those are removed before beginning range of motion exercises.  A general recommendation is to not lift your arms above shoulder height until drains are removed.  These exercises should be done to your tolerance and gently.  This is not a "no pain/no gain" type of recovery so listen to your body and stretch into the range of motion that you can tolerate, stopping if you have pain.  If you are having immediate reconstruction, ask your plastic surgeon about doing exercises as he or she may want you to wait.  We encourage you to attend the free one time ABC (After Breast Cancer) class offered by  [facility].  You will learn information related to lymphedema risk, prevention and treatment and additional exercises to regain mobility following surgery.  You can call [phone] for more information.  This is offered the 1st and 3rd Monday of each month.  You only attend the class one time.  While undergoing any medical procedure or treatment, try to avoid blood pressure being taken or needle sticks from occurring on the arm on the side of cancer.   This recommendation begins after surgery and continues for the rest of your life.  This may help reduce your risk of getting lymphedema (swelling in your arm).  An excellent resource for those seeking information  on lymphedema is the National Lymphedema Network's web site. It can be accessed at www.lymphnet.org  If you notice swelling in your hand, arm or breast at any time following surgery (even if it is many years from now), please contact your doctor or physical therapist to discuss this.  Lymphedema can be treated at any time but it is easier for you if it is treated early on.  If you feel like your shoulder motion is not returning to normal in a reasonable amount of time, please contact your surgeon or physical therapist.  Marti C. Bradly Sangiovanni, PT, CLT [phone]; [address] [place], [place] [address] ABC CLASS After Breast Cancer Class  After Breast Cancer Class is a specially designed exercise class to assist you in a safe recover after having breast cancer surgery.  In this class you will learn how to get back to full function whether your drains were just removed or if you had surgery a month ago.  This one-time class is held the 1st and 3rd Monday of every month from 11:00 a.m. until 12:00 noon at the [facility] located at [address] [place], [place] [address]  This class is FREE and space is limited. For more information or to register for the next available class, call [phone].  Class Goals   Understand specific stretches to improve the flexibility of you chest and shoulder.  Learn ways to safely strengthen your upper body and improve your posture.  Understand the warning signs of infection and why   you may be at risk for an arm infection.  Learn about Lymphedema and prevention.  ** You do not attend this class until after surgery.  Drains must be removed to participate  Patient was instructed today in a home exercise program today for post op shoulder range of motion. These included active assist shoulder flexion in sitting, scapular retraction, wall walking with shoulder abduction, and hands behind head external rotation.  She was  encouraged to do these twice a day, holding 3 seconds and repeating 5 times when permitted by her physician.    Also encouraged pt to begin walking with her husband and her walker to the mailbox at least once a day to begin building endurance.

## 2016-12-22 NOTE — Progress Notes (Signed)
Clinical Social Work [place] Psychosocial Distress Screening [place]  Patient completed distress screening protocol and scored a 7 on the Psychosocial Distress Thermometer which indicates moderate distress. Clinical Social Worker met with patient and patients husband in [facility] to assess for distress and other psychosocial needs. Patient stated she was feeling overwhelmed but felt "better" after meeting with the treatment team and getting more information on her treatment plan. CSW and patient discussed common feeling and emotions when being diagnosed with cancer, and the importance of support during treatment. CSW informed patient of the support team and support services at [facility], and patient was agreeable to an [employer] referral. CSW provided contact information and encouraged patient to call with any questions or concerns.  ONCBCN DISTRESS SCREENING 12/22/2016  Screening Type Initial Screening  Distress experienced in past week (1-10) 7  Emotional problem type Nervousness/Anxiety;Adjusting to illness  Spiritual/Religous concerns type Relating to God  Physical Problem type Sleep/insomnia;Swollen arms/legs  Physician notified of physical symptoms Yes  Referral to clinical social work Yes     Johnnye Lana, MSW, LCSW, OSW-C Clinical Social Worker [place] [phone]

## 2016-12-22 NOTE — Telephone Encounter (Signed)
I called and left a voicemail for Rhona letting her know that we will not place the order for the lab because patient sees [facility]. I did advise in my voicemail that [facility] is here this afternoon, so I will have one of the scheduler's get her onto the schedule for this afternoon. I advised for them to call back with any questions and that the results will be in [facility] for them to review.   Juliann Pulse - please help her get scheduled!

## 2016-12-22 NOTE — Telephone Encounter (Signed)
Spoke with Darlina Guys [facility] and they have an extra tube of blood and would like to have order for PT-INR so that pt does not have to come all the way over to our office for an additional draw. Order placed and faxed to [facility] lab. Nothing further needed at this time.

## 2016-12-22 NOTE — Therapy (Signed)
[place], [place], [address] Phone: [phone]   Fax:  [phone]  Physical Therapy Evaluation  Patient Details  Name: Danielle [name] MRN: [id number] Date of Birth: [birth year] Referring Provider: Dr. Rolm Bookbinder  Encounter Date: 12/22/2016      PT End of Session - 12/22/16 1245    Visit Number 1   Number of Visits 8   Date for PT Re-Evaluation 01/19/17   PT Start Time 0928   PT Stop Time 0945  Also saw pt from 1105-1130 for a total of 42 minutes   PT Time Calculation (min) 17 min   Activity Tolerance Patient tolerated treatment well   Behavior During Therapy [facility] for tasks assessed/performed      Past Medical History:  Diagnosis Date  . Allergy   . Aortic stenosis    Status post bioprosthetic AVR  . Arthritis    DJD  . Asthma   . Chronic systolic CHF (congestive heart failure) (HCC)    EF 30% 04/2014  . COPD (chronic obstructive pulmonary disease) ([place])   . Coronary artery disease   . Gastritis   . Hyperlipidemia   . Hypertension   . Hyperthyroidism   . Multiple thyroid nodules   . Osteoporosis   . Paroxysmal atrial fibrillation (HCC)   . Pelvis fracture ([place]) 08/19/2015   MULTIPLE   . Spontaneous pneumothorax 11/28/2015   left   . Stroke [facility])     Past Surgical History:  Procedure Laterality Date  . CHEST TUBE INSERTION  10/2015  . CORONARY ARTERY BYPASS GRAFT  04/2014  . INTRAMEDULLARY (IM) NAIL INTERTROCHANTERIC Left 12/10/2015   Procedure: INTRAMEDULLARY (IM) NAIL INTERTROCHANTRIC;  Surgeon: Leandrew Koyanagi, MD;  Location: [place];  Service: Orthopedics;  Laterality: Left;  . PLEURADESIS Left 12/03/2015   Procedure: PLEURADESIS;  Surgeon: Melrose Nakayama, MD;  Location: [place];  Service: Thoracic;  Laterality: Left;  . RESECTION OF APICAL BLEB Left 12/03/2015   Procedure: BLEBECTOMY;  Surgeon: Melrose Nakayama, MD;  Location: [place];  Service:  Thoracic;  Laterality: Left;  . THORACOSCOPY  12/03/2015  . VALVE REPLACEMENT    . VIDEO ASSISTED THORACOSCOPY Left 12/03/2015   Procedure: VIDEO ASSISTED THORACOSCOPY;  Surgeon: Melrose Nakayama, MD;  Location: [place];  Service: Thoracic;  Laterality: Left;    There were no vitals filed for this visit.       Subjective Assessment - 12/22/16 1208    Subjective Patient reports she is here today to be seen by her medical team for her newly diagnosed left breast cancer.   Patient is accompained by: Family member   Pertinent History Patient was diagnosed on 12/06/16 with left grade 2 invasive ductal carcinoma breast cancer. It measures 2.2 cm and is located in the upper outer quadrant. It is ER/PR positive and HER2 negative with a Ki67 of 30%. She has an extensive medical history including CVAs in 2000 and 2015, left hip fracture from a fall requiring surgery in 10/17, and a pelvic fracture from a fall in 2/17. She also had cardiac bypass 2/16.   Patient Stated Goals Stand > 2 minutes, walk to mailbox and in community, Get up from chair without UEs, reduce lymphedema risk, learn post op shoulder ROM HEP   Currently in Pain? Yes   Pain Score 7    Pain Location Hip  Also bilateral knees from OA   Pain Orientation Left   Pain Descriptors / Indicators Aching  Pain Type Chronic pain   Pain Onset More than a month ago   Pain Frequency Intermittent   Aggravating Factors  Standing, walking   Pain Relieving Factors sitting   Multiple Pain Sites No            [facility] PT Assessment - 12/22/16 0001      Assessment   Medical Diagnosis Left breast cancer; weakness   Referring Provider Dr. Rolm Bookbinder   Onset Date/Surgical Date 12/06/16   Hand Dominance Right   Prior Therapy none recent     Precautions   Precautions Other (comment)   Precaution Comments Falls; active cancer; hx CVA and CABG     Restrictions   Weight Bearing Restrictions No     Balance Screen   Has the patient  fallen in the past 6 months No   Has the patient had a decrease in activity level because of a fear of falling?  Yes   Is the patient reluctant to leave their home because of a fear of falling?  Yes     [place] Private residence   Living Arrangements Spouse/significant other   Available Help at Discharge Family   Type of [place] Access --  One step to enter   Home Layout One level   [place] - 2 wheels;Wheelchair - manual   Additional Comments Uses walker for household but w/c for community     Prior Function   Level of Independence Independent with household mobility with device   Vocation Retired   Leisure She does some home exercises given after her hip surgery last year     Cognition   Overall Cognitive Status Difficult to assess     Posture/Postural Control   Posture/Postural Control Postural limitations   Postural Limitations Rounded Shoulders;Forward head;Increased thoracic kyphosis   Posture Comments Unable to stand fully erect     ROM / Strength   AROM / PROM / Strength AROM;Strength     AROM   AROM Assessment Site Shoulder;Cervical   Right/Left Shoulder Left;Right   Right Shoulder Extension 60 Degrees   Right Shoulder Flexion 150 Degrees   Right Shoulder ABduction 68 Degrees   Left Shoulder Flexion 55 Degrees   Left Shoulder ABduction 141 Degrees   Left Shoulder Internal Rotation 115 Degrees   Left Shoulder External Rotation 57 Degrees   Left Shoulder Horizontal ABduction 57 Degrees   Cervical Flexion WNL   Cervical Extension WNL   Cervical - Right Side Bend 50% limited   Cervical - Left Side Bend 25% limited   Cervical - Right Rotation 25% limited   Cervical - Left Rotation 25% limited     Strength   Overall Strength Comments BLE grossly assessed; left hip flexion 3-/5, bil quads 4-/5     Transfers   Five time sit to stand comments  Difficulty with 1 sit to stand; BUE required            LYMPHEDEMA/ONCOLOGY QUESTIONNAIRE - 12/22/16 1240      Type   Cancer Type Left breast cancer     Lymphedema Assessments   Lymphedema Assessments Upper extremities     Right Upper Extremity Lymphedema   10 cm Proximal to Olecranon Process 35.7 cm   Olecranon Process 28.1 cm   10 cm Proximal to Ulnar Styloid Process 23.2 cm   Just Proximal to Ulnar Styloid Process 17.4 cm   Across Hand at [employer] 19 cm  At [facility] of 2nd Digit 6.6 cm     Left Upper Extremity Lymphedema   10 cm Proximal to Olecranon Process 37.2 cm  Area of protrusion on left upper arm; may be due to CVA 2000   Olecranon Process 28.2 cm   10 cm Proximal to Ulnar Styloid Process 22.8 cm   Just Proximal to Ulnar Styloid Process 16.5 cm   Across Hand at [employer] 19.6 cm   At [place] of 2nd Digit 6.6 cm         Objective measurements completed on examination: See above findings.     Patient was instructed today in a home exercise program today for post op shoulder range of motion. These included active assist shoulder flexion in sitting, scapular retraction, wall walking with shoulder abduction, and hands behind head external rotation.  She was encouraged to do these twice a day, holding 3 seconds and repeating 5 times when permitted by her physician.         PT Education - 12/22/16 1244    Education provided Yes   Education Details Post op shoulder ROM HEP, lymphedema risk reduction, walking program   Person(s) Educated Patient;Spouse   Methods Explanation;Demonstration;Handout   Comprehension Returned demonstration;Verbalized understanding                Long Term Clinic Goals - 12/22/16 1255      CC Long Term Goal  #1   Title Patient will be independent in initial home exercise program   Time 4   Period Weeks   Status New     CC Long Term Goal  #2   Title Patient will report she is able to stand >/= 5 minutes to do short kitchen tasks.   Time 4   Period Weeks   Status New      CC Long Term Goal  #3   Title Patient will report she is able to walk to her mailbox and back without stopping due to fatigue to improve function.   Time 4   Period Weeks   Status New     CC Long Term Goal  #4   Title Patient will increase left shoulder active abduction to >/= 130 degrees in supine to tolerate radiation positioning.   Time 4   Period Weeks   Status New     CC Long Term Goal  #5   Title Patient will be able to perform sit to stand from a standard chair without use of UEs.   Time 4   Period Weeks   Status New             Plan - 12/22/16 1248    Clinical Impression Statement Patient was diagnosed on 12/06/16 with left grade 2 invasive ductal carcinoma breast cancer. It measures 2.2 cm and is located in the upper outer quadrant. It is ER/PR positive and HER2 negative with a Ki67 of 30%. She has an extensive medical history including CVAs in 2000 and 2015, left hip fracture from a fall requiring surgery in 10/17, and a pelvic fracture from a fall in 2/17. She also had cardiac bypass 2/16. Her multidisciplinary medical team met prior to her assessments to detemine a recommended treatment plan for her breast cancer. She is planning to have a left lumpectomy and sentinel node biopsy followed by Oncotype testing, radiation, and anti-estrogen therapy. She will benefit from physical therapy now to reduce fall risk, increase strength, and improve overall function and mobility.   History  and Personal Factors relevant to plan of care: Multiple comorbidities including 2 CVAs, hip surgery, multiple fall history, and recent cancer diagnosis   Clinical Presentation Evolving   Clinical Presentation due to: Comorbidities may impact surgical outcomes; overall weakness may impede outcomes   Clinical Decision Making Moderate   Rehab Potential Good   Clinical Impairments Affecting Rehab Potential Multiple comorbidities   PT Frequency 2x / week   PT Duration 4 weeks   PT  Treatment/Interventions ADLs/Self Care Home Management;Functional mobility training;Gait training;DME Instruction;Therapeutic activities;Therapeutic exercise;Balance training;Neuromuscular re-education;Patient/family education;Passive range of motion;Manual techniques   PT Next Visit Plan Begin gentle functional exercise program for increasing function before surgery; balance re-ed; begin PROM left shoulder if time permits (or pulleys)   PT Home Exercise Plan Post op shoulder ROM HEP   Consulted and Agree with Plan of Care Patient;Family member/caregiver   Family Member Consulted Husband      Patient will benefit from skilled therapeutic intervention in order to improve the following deficits and impairments:  Abnormal gait, Decreased activity tolerance, Decreased balance, Decreased mobility, Decreased strength, Postural dysfunction, Decreased knowledge of precautions, Pain, Impaired UE functional use, Decreased endurance, Decreased range of motion, Difficulty walking  Visit Diagnosis: Malignant neoplasm of upper-outer quadrant of left breast in female, estrogen receptor positive ([place]) - Plan: PT plan of care cert/re-cert  Abnormal posture - Plan: PT plan of care cert/re-cert  Difficulty in walking, not elsewhere classified - Plan: PT plan of care cert/re-cert  Muscle weakness (generalized) - Plan: PT plan of care cert/re-cert  Stiffness of left shoulder, not elsewhere classified - Plan: PT plan of care cert/re-cert  Pain in left hip - Plan: PT plan of care cert/re-cert  Chronic pain of left knee - Plan: PT plan of care cert/re-cert  Chronic pain of right knee - Plan: PT plan of care cert/re-cert      G-Codes - 64/33/29 1258    Functional Assessment Tool Used (Outpatient Only) Clinical Judgement   Functional Limitation Mobility: Walking and moving around   Mobility: Walking and Moving Around Current Status (J1884) At least 80 percent but less than 100 percent impaired, limited or  restricted   Mobility: Walking and Moving Around Goal Status (Z6606) At least 40 percent but less than 60 percent impaired, limited or restricted     Patient will follow up at outpatient cancer rehab 3-4 weeks following surgery.  If the patient requires physical therapy at that time, a specific plan will be dictated and sent to the referring physician for approval. The patient was educated today on appropriate basic range of motion exercises to begin post operatively and the importance of attending the After Breast Cancer class following surgery.  Patient was educated today on lymphedema risk reduction practices as it pertains to recommendations that will benefit the patient immediately following surgery.  She verbalized good understanding.     Problem List Patient Active Problem List   Diagnosis Date Noted  . Malignant neoplasm of upper-outer quadrant of left breast in female, estrogen receptor positive ([place]) 12/16/2016  . Chronic knee pain 12/07/2016  . Chronic bilateral low back pain without sciatica 12/07/2016  . Peripheral musculoskeletal gait disorder 12/07/2016  . Encounter for therapeutic drug monitoring 06/16/2016  . Generalized osteoarthritis of multiple sites 05/04/2016  . Chronic anticoagulation 05/04/2016  . Stroke ([place]) 04/14/2016  . Aortic valve disease 04/10/2016  . S/P AVR 04/10/2016  . NICM (nonischemic cardiomyopathy) ([place]) 04/10/2016  . Upper airway cough syndrome 03/14/2016  . Morbid obesity due  to excess calories ([place]) 03/14/2016  . COPD GOLD 0  03/11/2016  . Thrombocytopenia ([place]) 12/12/2015  . Anemia 12/12/2015  . Vitamin D deficiency 12/12/2015  . Acute urinary retention 12/11/2015  . Osteoporosis 12/11/2015  . Hip fracture ([place]) 12/10/2015  . Aortic atherosclerosis ([place]) 12/10/2015  . Lung blebs ([place]) 12/03/2015  . Pelvic fracture ([place]) 08/19/2015  . Fall at home 08/19/2015  . Essential hypertension 08/19/2015  . Hyperlipidemia 08/19/2015  . Asthma  08/19/2015  . Atrial fibrillation ([place]) 08/19/2015  . Hyperthyroidism 08/19/2015  . Chronic systolic heart failure ([place])   . 3-vessel CAD   . Aortic stenosis    Annia Friendly, PT 12/22/16 1:18 PM  [place], [place], [address] Phone: [phone]   Fax:  [phone]  Name: Danielle [name] MRN: [id number] Date of Birth: 05-03-1945

## 2016-12-23 ENCOUNTER — Encounter: Payer: Self-pay | Admitting: Hematology

## 2016-12-24 ENCOUNTER — Ambulatory Visit (INDEPENDENT_AMBULATORY_CARE_PROVIDER_SITE_OTHER): Payer: Medicare Other | Admitting: General Practice

## 2016-12-24 ENCOUNTER — Telehealth: Payer: Self-pay | Admitting: Family Medicine

## 2016-12-24 ENCOUNTER — Telehealth: Payer: Self-pay | Admitting: Internal Medicine

## 2016-12-24 DIAGNOSIS — Z7901 Long term (current) use of anticoagulants: Secondary | ICD-10-CM | POA: Diagnosis not present

## 2016-12-24 NOTE — Patient Instructions (Signed)
Pre visit review using our clinic review tool, if applicable. No additional management support is needed unless otherwise documented below in the visit note. 

## 2016-12-24 NOTE — Telephone Encounter (Signed)
I spoke with patient to let her know that I will call her as soon as I get lab results.  Patient verbalized understanding.

## 2016-12-24 NOTE — Telephone Encounter (Signed)
Pt had INR done at the cancer center wed, 10/24. They called and we put in an order so pt would not have to travel over here. However, pt has not heard anything about results and does not know what to do. Dr [name] is out of the office today. Please advise.  I think they sent out to have done.

## 2016-12-24 NOTE — Telephone Encounter (Signed)
° °  [facility] Pre-operative Risk Assessment    Request for surgical clearance:  1. What type of surgery is being performed? LEFT BREAST SEED GUIDED LUMPECTOMY FOR LEFT BREAST CANCER  2. When is this surgery scheduled? NEAR FUTURE  3. Are there any medications that need to be held prior to surgery and how long? WARFARIN   4. Practice name and name of physician performing surgery? [facility], DR. MATTHEW WAKEFIELD  5. What is your office phone and fax number? PH: [phone], FAX: [phone], ATTNIllene Regulus, CMA  6. Anesthesia type (None, local, MAC, general) ? GENERAL   Derl Barrow 12/24/2016, 8:28 AM  _________________________________________________________________   (provider comments below)

## 2016-12-27 ENCOUNTER — Telehealth: Payer: Self-pay

## 2016-12-27 ENCOUNTER — Other Ambulatory Visit: Payer: Self-pay | Admitting: Hematology

## 2016-12-27 MED ORDER — ALPRAZOLAM 0.25 MG PO TABS: 0.2500 mg | ORAL_TABLET | Freq: Three times a day (TID) | ORAL | 0 refills | Status: DC | PRN

## 2016-12-27 NOTE — Telephone Encounter (Signed)
    Chart reviewed as part of pre-operative protocol coverage. Patient was contacted 12/27/2016 in reference to pre-operative risk assessment for pending surgery as outlined below.  Danielle [name] was last seen on 12/03/2016 by Dr End.    Clearance for surgery was not addressed. She was having some mild volume overload as well as deconditioning.  We will route this visit to Dr. Saunders Revel to see if the patient can be cleared from his viewpoint.    Danielle Ferries, PA-C 12/27/2016, 2:14 PM   .

## 2016-12-27 NOTE — Telephone Encounter (Signed)
Pt called that she is anxious and she has not been able to sleep at all. "it's so much". She is crying on phone. She is waking up at 3 or 5 am. She has had 5 operations in 3 years and now this. She is on a walker or wheelchair and that is new too. 2 Tylenol PM every night does not help. Husband is good and a great support. She has spoken with Johnnye Lana today.

## 2016-12-27 NOTE — Telephone Encounter (Signed)
Ms. Vides did not have any unstable cardiac symptoms at the time of her clinic visit earlier this month. Due to her history of heart failure and stroke, she is intermediate risk for perioperative complications during lumpectomy. I recommend that we expedite the echo that we discussed/ordered at our last visit. If it is stable to improved, Ms. Kneisley can proceed with her surgery without further testing. Given history of paroxysmal atrial fibrillation, stroke, and cardiomyopathy, I recommend bridging with enoxaparin in the perioperative period, as directed by our anticoagulation clinic.  [name], MD [facility] Pager: [phone]

## 2016-12-27 NOTE — Telephone Encounter (Signed)
Called pt & informed that Dr Burr Medico will order xanax to take TID PRN anxiety/sleep.  Called script to pharmacy.  Pt expressed understanding.

## 2016-12-27 NOTE — Telephone Encounter (Signed)
Patient with diagnosis of A Fib on warfarin for anticoagulation.    Procedure: left breast seed guided lumpectomy Date of procedure: TBD  CHADS2-VASc score of  6 (CHF, HTN, AGE, stroke/tia x 2, CAD female)  Per office protocol, patient can hold warfarin for 5days prior to procedure.  Patient will need bridging with Lovenox (enoxaparin) around procedure.  *Need date for procedure.  Patient to schedule appt with coumadin clinic (at [facility] per patient preference) to arrange bridge plan once day of procedure know*

## 2016-12-28 ENCOUNTER — Ambulatory Visit: Payer: Medicare Other

## 2016-12-28 ENCOUNTER — Telehealth: Payer: Self-pay | Admitting: *Deleted

## 2016-12-28 DIAGNOSIS — I429 Cardiomyopathy, unspecified: Secondary | ICD-10-CM

## 2016-12-28 DIAGNOSIS — M25612 Stiffness of left shoulder, not elsewhere classified: Secondary | ICD-10-CM

## 2016-12-28 DIAGNOSIS — R293 Abnormal posture: Secondary | ICD-10-CM

## 2016-12-28 DIAGNOSIS — M25552 Pain in left hip: Secondary | ICD-10-CM

## 2016-12-28 DIAGNOSIS — M6281 Muscle weakness (generalized): Secondary | ICD-10-CM

## 2016-12-28 DIAGNOSIS — M25561 Pain in right knee: Secondary | ICD-10-CM

## 2016-12-28 DIAGNOSIS — Z170 Estrogen receptor positive status [ER+]: Principal | ICD-10-CM

## 2016-12-28 DIAGNOSIS — C50412 Malignant neoplasm of upper-outer quadrant of left female breast: Secondary | ICD-10-CM | POA: Diagnosis not present

## 2016-12-28 DIAGNOSIS — G8929 Other chronic pain: Secondary | ICD-10-CM

## 2016-12-28 DIAGNOSIS — R262 Difficulty in walking, not elsewhere classified: Secondary | ICD-10-CM

## 2016-12-28 DIAGNOSIS — M25562 Pain in left knee: Secondary | ICD-10-CM

## 2016-12-28 NOTE — Therapy (Signed)
[place], [place], [address] Phone: [phone]   Fax:  [phone]  Physical Therapy Treatment  Patient Details  Name: Danielle [name] MRN: [id number] Date of Birth: 06-21-1945 Referring Provider: Dr. Rolm Bookbinder  Encounter Date: 12/28/2016      PT End of Session - 12/28/16 0944    Visit Number 2   Number of Visits 8   Date for PT Re-Evaluation 01/19/17   PT Start Time 0852   PT Stop Time 0940   PT Time Calculation (min) 48 min   Activity Tolerance Patient tolerated treatment well   Behavior During Therapy [facility] for tasks assessed/performed      Past Medical History:  Diagnosis Date  . Allergy   . Aortic stenosis    Status post bioprosthetic AVR  . Arthritis    DJD  . Asthma   . Chronic systolic CHF (congestive heart failure) (HCC)    EF 30% 04/2014  . COPD (chronic obstructive pulmonary disease) ([place])   . Coronary artery disease   . Gastritis   . Hyperlipidemia   . Hypertension   . Hyperthyroidism   . Multiple thyroid nodules   . Osteoporosis   . Paroxysmal atrial fibrillation (HCC)   . Pelvis fracture ([place]) 08/19/2015   MULTIPLE   . Spontaneous pneumothorax 11/28/2015   left   . Stroke [facility])     Past Surgical History:  Procedure Laterality Date  . CHEST TUBE INSERTION  10/2015  . CORONARY ARTERY BYPASS GRAFT  04/2014  . INTRAMEDULLARY (IM) NAIL INTERTROCHANTERIC Left 12/10/2015   Procedure: INTRAMEDULLARY (IM) NAIL INTERTROCHANTRIC;  Surgeon: Leandrew Koyanagi, MD;  Location: [place];  Service: Orthopedics;  Laterality: Left;  . PLEURADESIS Left 12/03/2015   Procedure: PLEURADESIS;  Surgeon: Melrose Nakayama, MD;  Location: [place];  Service: Thoracic;  Laterality: Left;  . RESECTION OF APICAL BLEB Left 12/03/2015   Procedure: BLEBECTOMY;  Surgeon: Melrose Nakayama, MD;  Location: [place];  Service: Thoracic;  Laterality: Left;  . THORACOSCOPY  12/03/2015  .  VALVE REPLACEMENT    . VIDEO ASSISTED THORACOSCOPY Left 12/03/2015   Procedure: VIDEO ASSISTED THORACOSCOPY;  Surgeon: Melrose Nakayama, MD;  Location: [place];  Service: Thoracic;  Laterality: Left;    There were no vitals filed for this visit.      Subjective Assessment - 12/28/16 0856    Subjective I've been waking up alot at night so I called the doctor yesterday and they are going ot prescribe me Xanax but I'm nervous about taking it because it's habit forming. But I really have had trouble sleeping and feeling anxious. My Lt knee is bothering me today but that's normal.    Pertinent History Patient was diagnosed on 12/06/16 with left grade 2 invasive ductal carcinoma breast cancer. It measures 2.2 cm and is located in the upper outer quadrant. It is ER/PR positive and HER2 negative with a Ki67 of 30%. She has an extensive medical history including CVAs in 2000 and 2015, left hip fracture from a fall requiring surgery in 10/17, and a pelvic fracture from a fall in 2/17. She also had cardiac bypass 2/16.   Patient Stated Goals Stand > 2 minutes, walk to mailbox and in community, Get up from chair without UEs, reduce lymphedema risk, learn post op shoulder ROM HEP   Currently in Pain? Yes   Pain Score 5    Pain Location Knee   Pain Orientation Left   Pain Descriptors /  Indicators Aching   Pain Type Chronic pain   Pain Onset More than a month ago   Pain Frequency Intermittent   Aggravating Factors  standing, walking   Pain Relieving Factors rest                         [facility] Adult PT Treatment/Exercise - 12/28/16 0001      Self-Care   Self-Care Other Self-Care Comments   Other Self-Care Comments  Spent time instructing pt in importance of daily walking routine and how to keep a log.      Lumbar Exercises: Supine   Bridge 10 reps   Bridge Limitations VCs for full ROM     Knee/Hip Exercises: Standing   Heel Raises Both;2 sets;10 reps  second set on Airex both at  back of bike   Other Standing Knee Exercises At back of bike on Airex for Narrow BOS standing eyes open 30 sec, then eyes closed 30 sec with SBA     Knee/Hip Exercises: Seated   Long Arc Quad Strengthening;Right;Left;5 reps   Long Arc Quad Limitations Had to stop on Lt due to pain coming from hip   Ball Squeeze 10, 5 sec holds   Clamshell with TheraBand Yellow  10 times, 5 sec holds   Marching Strengthening;Both;10 reps  5 sec holds     Knee/Hip Exercises: Sidelying   Hip ABduction Strengthening;Both;10 reps     Shoulder Exercises: Pulleys   Flexion 2 minutes   Flexion Limitations Pt returned correct demonstration after multiple VCs   ABduction 1 minute   ABduction Limitations VCs to decrease trunk lean                PT Education - 12/28/16 0943    Education provided Yes   Education Details Began LE strength   Person(s) Educated Patient   Methods Explanation;Demonstration;Handout   Comprehension Verbalized understanding;Returned demonstration;Need further instruction                [facility] Goals - 12/22/16 1255      CC Long Term Goal  #1   Title Patient will be independent in initial home exercise program   Time 4   Period Weeks   Status New     CC Long Term Goal  #2   Title Patient will report she is able to stand >/= 5 minutes to do short kitchen tasks.   Time 4   Period Weeks   Status New     CC Long Term Goal  #3   Title Patient will report she is able to walk to her mailbox and back without stopping due to fatigue to improve function.   Time 4   Period Weeks   Status New     CC Long Term Goal  #4   Title Patient will increase left shoulder active abduction to >/= 130 degrees in supine to tolerate radiation positioning.   Time 4   Period Weeks   Status New     CC Long Term Goal  #5   Title Patient will be able to perform sit to stand from a standard chair without use of UEs.   Time 4   Period Weeks   Status New             Plan - 12/28/16 1025    Clinical Impression Statement Pt demonstrated alot of Lt hip instability with any attempt at SLS activity so focused on seated  and supine strengtheing activites today instead. Also educated pt on importance of beginning a daily walking routine and keeping a log so she can know what she can tolerate now and how to progress herself to focus of increasing her strength in preparation for her upcomoing surgery. Pt was encouraged by exercises today and instruction of walking program as something she felt she could do.    Rehab Potential Good   Clinical Impairments Affecting Rehab Potential Multiple comorbidities   PT Frequency 2x / week   PT Duration 4 weeks   PT Treatment/Interventions ADLs/Self Care Home Management;Functional mobility training;Gait training;DME Instruction;Therapeutic activities;Therapeutic exercise;Balance training;Neuromuscular re-education;Patient/family education;Passive range of motion;Manual techniques   PT Home Exercise Plan LE strength in varying positions and begin daily walking routine.   Consulted and Agree with Plan of Care Patient      Patient will benefit from skilled therapeutic intervention in order to improve the following deficits and impairments:  Abnormal gait, Decreased activity tolerance, Decreased balance, Decreased mobility, Decreased strength, Postural dysfunction, Decreased knowledge of precautions, Pain, Impaired UE functional use, Decreased endurance, Decreased range of motion, Difficulty walking  Visit Diagnosis: Malignant neoplasm of upper-outer quadrant of left breast in female, estrogen receptor positive (HCC)  Abnormal posture  Difficulty in walking, not elsewhere classified  Muscle weakness (generalized)  Stiffness of left shoulder, not elsewhere classified  Pain in left hip  Chronic pain of left knee  Chronic pain of right knee     Problem List Patient Active Problem List   Diagnosis Date Noted  .  Malignant neoplasm of upper-outer quadrant of left breast in female, estrogen receptor positive ([place]) 12/16/2016  . Chronic knee pain 12/07/2016  . Chronic bilateral low back pain without sciatica 12/07/2016  . Peripheral musculoskeletal gait disorder 12/07/2016  . Encounter for therapeutic drug monitoring 06/16/2016  . Generalized osteoarthritis of multiple sites 05/04/2016  . Chronic anticoagulation 05/04/2016  . Stroke ([place]) 04/14/2016  . Aortic valve disease 04/10/2016  . S/P AVR 04/10/2016  . NICM (nonischemic cardiomyopathy) ([place]) 04/10/2016  . Upper airway cough syndrome 03/14/2016  . Morbid obesity due to excess calories ([place]) 03/14/2016  . COPD GOLD 0  03/11/2016  . Thrombocytopenia ([place]) 12/12/2015  . Anemia 12/12/2015  . Vitamin D deficiency 12/12/2015  . Acute urinary retention 12/11/2015  . Osteoporosis 12/11/2015  . Hip fracture ([place]) 12/10/2015  . Aortic atherosclerosis ([place]) 12/10/2015  . Lung blebs ([place]) 12/03/2015  . Pelvic fracture ([place]) 08/19/2015  . Fall at home 08/19/2015  . Essential hypertension 08/19/2015  . Hyperlipidemia 08/19/2015  . Asthma 08/19/2015  . Atrial fibrillation ([place]) 08/19/2015  . Hyperthyroidism 08/19/2015  . Chronic systolic heart failure ([place])   . 3-vessel CAD   . Aortic stenosis     Otelia Limes, PTA 12/28/2016, 11:05 AM  [place], [place], [address] Phone: [phone]   Fax:  [phone]  Name: Danielle [name] MRN: [id number] Date of Birth: 08/31/1945

## 2016-12-28 NOTE — Patient Instructions (Addendum)
Heel Raise: Bilateral (Standing)   Cancer Rehab [phone]    Stand near counter for fingertip support if needed. Rise on balls of feet. Repeat __10-20__ times per set. Do _1-2___ sets per session. Do __2__ sessions per day.  Bridging    Slowly raise buttocks from floor, keeping stomach tight. Repeat _10___ times per set. Do __1-2__ sets per session. Do __2-3__ sessions per day.  Abduction: Clam (Eccentric) - Side-Lying    Lie on side with knees bent. Lift top knee, keeping feet together. Keep trunk steady. Slowly lower for 3-5 seconds. _10__ reps per set, _2-3__ sets per day.   WALKING  Walking is a great form of exercise to increase your strength, endurance and overall fitness.  A walking program can help you start slowly and gradually build endurance as you go.  Everyone's ability is different, so each person's starting point will be different.  You do not have to follow them exactly.  The are just samples. You should simply find out what's right for you and stick to that program.   In the beginning, you'll start off walking 2-3 times a day for short distances.  As you get stronger, you'll be walking further at just 1-2 times per day.  A. You Can Walk For A Certain Length Of Time Each Day    Walk 5 minutes 3 times per day.  Increase 2 minutes every 2 days (3 times per day).  Work up to 25-30 minutes (1-2 times per day).   Example:   Day 1-2 5 minutes 3 times per day   Day 7-8 12 minutes 2-3 times per day   Day 13-14 25 minutes 1-2 times per  (Remember trying to take even steps with placing heel to toe on the ground for each step)

## 2016-12-28 NOTE — Telephone Encounter (Signed)
Please schedule echo sooner as recommended by Dr. Saunders Revel and appointment with coumadin clinic to discuss Lovenox bridging.

## 2016-12-28 NOTE — Telephone Encounter (Signed)
SPOKE TO PT AND PT AWARE OF ECHO SCHEDULED ON 01-19-17  AT 4 PM   SURGEON OFFICE WAS ALSO CONTACTED CENTRAL SURGERY. [phone] TO SPEAK TO PROVIDER NURSE.  SHE  EXPLAINED THEY CONTACT THE COUMADIN OFFICE WITH PROCEDURE DATES  AND INFORMATION AFTER CARDIOLOGIST HAS CLEARED THEM.

## 2016-12-28 NOTE — Telephone Encounter (Deleted)
Duplicate

## 2016-12-28 NOTE — Telephone Encounter (Addendum)
PT HAS BEEN SCHEDULED FOR ECHO  12-31-16  ONCE CLEARED SURGEON OFFICE  WILL CONTACT COUMADIN OFFICE WITH DATE.

## 2016-12-28 NOTE — Telephone Encounter (Signed)
Left message for a return phone call.

## 2016-12-30 HISTORY — PX: BREAST LUMPECTOMY: SHX2

## 2016-12-31 ENCOUNTER — Other Ambulatory Visit: Payer: Self-pay

## 2016-12-31 ENCOUNTER — Ambulatory Visit (HOSPITAL_COMMUNITY): Payer: Medicare Other | Attending: Cardiology

## 2016-12-31 DIAGNOSIS — I420 Dilated cardiomyopathy: Secondary | ICD-10-CM | POA: Diagnosis not present

## 2016-12-31 DIAGNOSIS — I429 Cardiomyopathy, unspecified: Secondary | ICD-10-CM

## 2017-01-03 NOTE — Telephone Encounter (Signed)
Per recent echocardiogram report, the patient has been cleared for surgery per Dr. Saunders Revel. Will forward clearance to [facility]. Once patient is aware of her surgery date, she will need to establish an appointment with the Coumadin Clinic to arrange for Lovenox bridging.   Signed, Erma Heritage, PA-C 01/03/2017, 2:31 PM

## 2017-01-03 NOTE — Telephone Encounter (Signed)
Copied from echo report done 12/31/16:  Notes recorded by [name], MD on 01/02/2017 at 4:17 PM EST Please let Danielle [name] know that her echo shows stable to ejection fraction and appropriate function of her aortic valve prosthesis. I think it is fine for her to proceed with her planned surgery without further cardiac testing or intervention. We will follow-up as planned in the office.  Pt advised Dr End has reviewed echo report and has said okay to proceed with surgery, I will forward to Pre-Op Pool to officially provide surgical clearance, I will also forward to CVRR [place] and Pre-Op Pharm Pool

## 2017-01-04 ENCOUNTER — Telehealth: Payer: Self-pay

## 2017-01-04 ENCOUNTER — Ambulatory Visit: Payer: Medicare Other | Attending: General Surgery | Admitting: Physical Therapy

## 2017-01-04 ENCOUNTER — Other Ambulatory Visit: Payer: Self-pay

## 2017-01-04 ENCOUNTER — Telehealth: Payer: Self-pay | Admitting: Family Medicine

## 2017-01-04 ENCOUNTER — Ambulatory Visit: Payer: Medicare Other | Admitting: Physical Medicine & Rehabilitation

## 2017-01-04 DIAGNOSIS — C50412 Malignant neoplasm of upper-outer quadrant of left female breast: Secondary | ICD-10-CM

## 2017-01-04 DIAGNOSIS — R262 Difficulty in walking, not elsewhere classified: Secondary | ICD-10-CM | POA: Diagnosis not present

## 2017-01-04 DIAGNOSIS — Z170 Estrogen receptor positive status [ER+]: Secondary | ICD-10-CM | POA: Insufficient documentation

## 2017-01-04 DIAGNOSIS — M25562 Pain in left knee: Secondary | ICD-10-CM | POA: Diagnosis not present

## 2017-01-04 DIAGNOSIS — M25561 Pain in right knee: Secondary | ICD-10-CM | POA: Diagnosis not present

## 2017-01-04 DIAGNOSIS — M25552 Pain in left hip: Secondary | ICD-10-CM | POA: Insufficient documentation

## 2017-01-04 DIAGNOSIS — M6281 Muscle weakness (generalized): Secondary | ICD-10-CM | POA: Insufficient documentation

## 2017-01-04 DIAGNOSIS — R293 Abnormal posture: Secondary | ICD-10-CM

## 2017-01-04 DIAGNOSIS — M25612 Stiffness of left shoulder, not elsewhere classified: Secondary | ICD-10-CM | POA: Insufficient documentation

## 2017-01-04 DIAGNOSIS — G8929 Other chronic pain: Secondary | ICD-10-CM | POA: Diagnosis not present

## 2017-01-04 NOTE — Telephone Encounter (Signed)
Pt had echo on Friday, looked good. Pt is having lumpectomy by Dr Donne Hazel. The date is not set yet. She is asking who will be managing her coumadin? Per OV note 12/22/16 it looks like Dr Burr Medico is deferring to Dr [name] (PCP). Is this correct? She has also contacted Dr [name] who has been managing her coumadin prior to dx with cancer.

## 2017-01-04 NOTE — Telephone Encounter (Signed)
I spoke with patient.  I asked her to let me know when her surgery is scheduled.  I can then give her instructions for holding coumadin.  I also let patient know that I will dose her Lovenox.  Patient verbalized understanding and said she will let me know when a surgery date is scheduled.

## 2017-01-04 NOTE — Telephone Encounter (Signed)
Pt is calling stating that she is concerned about who will be calling and checking her INR she has not heard anything from anyone since she was Dx with cancer and she would like to have a call back.

## 2017-01-04 NOTE — Therapy (Signed)
[place], [place], [address] Phone: [phone]   Fax:  [phone]  Physical Therapy Treatment  Patient Details  Name: Danielle [name] MRN: [id number] Date of Birth: November 08, 1945 Referring Provider: Dr. Rolm Bookbinder   Encounter Date: 01/04/2017  PT End of Session - 01/04/17 1650    Visit Number  3    Number of Visits  8    Date for PT Re-Evaluation  01/19/17    PT Start Time  1603    PT Stop Time  1645    PT Time Calculation (min)  42 min    Activity Tolerance  Patient tolerated treatment well    Behavior During Therapy  [facility] for tasks assessed/performed       Past Medical History:  Diagnosis Date  . Allergy   . Aortic stenosis    Status post bioprosthetic AVR  . Arthritis    DJD  . Asthma   . Chronic systolic CHF (congestive heart failure) (HCC)    EF 30% 04/2014  . COPD (chronic obstructive pulmonary disease) ([place])   . Coronary artery disease   . Gastritis   . Hyperlipidemia   . Hypertension   . Hyperthyroidism   . Multiple thyroid nodules   . Osteoporosis   . Paroxysmal atrial fibrillation (HCC)   . Pelvis fracture ([place]) 08/19/2015   MULTIPLE   . Spontaneous pneumothorax 11/28/2015   left   . Stroke [facility])     Past Surgical History:  Procedure Laterality Date  . CHEST TUBE INSERTION  10/2015  . CORONARY ARTERY BYPASS GRAFT  04/2014  . THORACOSCOPY  12/03/2015  . VALVE REPLACEMENT      There were no vitals filed for this visit.  Subjective Assessment - 01/04/17 1606    Subjective  "I saw my cardiac doctor.  I had an echocardiogram on Friday and they said everything is good.  I had a valve replacement and it's good.  I haven't had surgery yet." Has been doing exercises; walked 10 minutes one day and other days maybe 5, because her knees hurt when the weather's bad.    Pertinent History  Patient was diagnosed on 12/06/16 with left grade 2 invasive ductal  carcinoma breast cancer. It measures 2.2 cm and is located in the upper outer quadrant. It is ER/PR positive and HER2 negative with a Ki67 of 30%. She has an extensive medical history including CVAs in 2000 and 2015, left hip fracture from a fall requiring surgery in 10/17, and a pelvic fracture from a fall in 2/17. She also had cardiac bypass 2/16.    Currently in Pain?  Yes    Pain Score  7     Pain Location  Knee    Pain Orientation  Left    Pain Descriptors / Indicators  Aching    Aggravating Factors   weather    Pain Relieving Factors  tramadol and tylenol                      [facility] Adult PT Treatment/Exercise - 01/04/17 0001      Lumbar Exercises: Supine   Bridge  3 seconds 12 repw   12 repw   Bridge Limitations  limited ROM; difficult    Other Supine Lumbar Exercises  --      Knee/Hip Exercises: Standing   Heel Raises  Both;20 reps;3 seconds both hand support on wheelchair handles   both hand support on wheelchair handles  Hip Flexion  AROM;Right;Left;10 reps;Knee bent;2 sets marching, hands on wheelchair handles   marching, hands on wheelchair handles   Functional Squat  20 reps hands on WC handles, partial ROM   hands on WC handles, partial ROM   Other Standing Knee Exercises  stand without UE support x 1'12", then she needed to sit down reminders to tuck hips forward and keep shoulders back   reminders to tuck hips forward and keep shoulders back     Knee/Hip Exercises: Seated   Sit to Sand  without UE support mat at 24" height, hands on thighs, x 12   mat at 24" height, hands on thighs, x 12     Knee/Hip Exercises: Supine   Short Arc Quad Sets  AROM;Right;Left;10 reps    Hip Adduction Isometric  Strengthening;Both hooklying pillow squeezes 5 counts x 12   hooklying pillow squeezes 5 counts x 12   Other Supine Knee/Hip Exercises  hooklying bilat. hip abduction/er with green Theraband 12 x 2      Knee/Hip Exercises: Sidelying   Other Sidelying Knee/Hip  Exercises  in right sidelying, left hip abduction/clam shell x 12 reps VC to slow down and breathe   VC to slow down and breathe                    [facility] - 12/22/16 1255      CC Long Term Goal  #1   Title  Patient will be independent in initial home exercise program    Time  4    Period  Weeks    Status  New      CC Long Term Goal  #2   Title  Patient will report she is able to stand >/= 5 minutes to do short kitchen tasks.    Time  4    Period  Weeks    Status  New      CC Long Term Goal  #3   Title  Patient will report she is able to walk to her mailbox and back without stopping due to fatigue to improve function.    Time  4    Period  Weeks    Status  New      CC Long Term Goal  #4   Title  Patient will increase left shoulder active abduction to >/= 130 degrees in supine to tolerate radiation positioning.    Time  4    Period  Weeks    Status  New      CC Long Term Goal  #5   Title  Patient will be able to perform sit to stand from a standard chair without use of UEs.    Time  4    Period  Weeks    Status  New         Plan - 01/04/17 1651    Clinical Impression Statement  Pt. did well with exercises today and felt she had worked hard by the end of the session.  We reviewed the HEP given last time, and she was able to do these exercises, though struggled with the bridging and needed reminders to slow down and to breathe during exercise.     Rehab Potential  Good    Clinical Impairments Affecting Rehab Potential  Multiple comorbidities    PT Frequency  2x / week    PT Duration  4 weeks    PT Treatment/Interventions  ADLs/Self Care Home Management;Functional mobility training;Gait  training;DME Instruction;Therapeutic activities;Therapeutic exercise;Balance training;Neuromuscular re-education;Patient/family education;Passive range of motion;Manual techniques    PT Next Visit Plan  Start with pulleys.  Continue LE strengthening and balance  exercises.  Repeat standing without UE support for time.    PT Home Exercise Plan  LE strength in varying positions and begin daily walking routine.    Consulted and Agree with Plan of Care  Patient       Patient will benefit from skilled therapeutic intervention in order to improve the following deficits and impairments:  Abnormal gait, Decreased activity tolerance, Decreased balance, Decreased mobility, Decreased strength, Postural dysfunction, Decreased knowledge of precautions, Pain, Impaired UE functional use, Decreased endurance, Decreased range of motion, Difficulty walking  Visit Diagnosis: Malignant neoplasm of upper-outer quadrant of left breast in female, estrogen receptor positive (HCC)  Abnormal posture  Difficulty in walking, not elsewhere classified  Muscle weakness (generalized)     Problem List Patient Active Problem List   Diagnosis Date Noted  . Malignant neoplasm of upper-outer quadrant of left breast in female, estrogen receptor positive ([place]) 12/16/2016  . Chronic knee pain 12/07/2016  . Chronic bilateral low back pain without sciatica 12/07/2016  . Peripheral musculoskeletal gait disorder 12/07/2016  . Encounter for therapeutic drug monitoring 06/16/2016  . Generalized osteoarthritis of multiple sites 05/04/2016  . Chronic anticoagulation 05/04/2016  . Stroke ([place]) 04/14/2016  . Aortic valve disease 04/10/2016  . S/P AVR 04/10/2016  . NICM (nonischemic cardiomyopathy) ([place]) 04/10/2016  . Upper airway cough syndrome 03/14/2016  . Morbid obesity due to excess calories ([place]) 03/14/2016  . COPD GOLD 0  03/11/2016  . Thrombocytopenia ([place]) 12/12/2015  . Anemia 12/12/2015  . Vitamin D deficiency 12/12/2015  . Acute urinary retention 12/11/2015  . Osteoporosis 12/11/2015  . Hip fracture ([place]) 12/10/2015  . Aortic atherosclerosis ([place]) 12/10/2015  . Lung blebs ([place]) 12/03/2015  . Pelvic fracture ([place]) 08/19/2015  . Fall at home 08/19/2015  . Essential  hypertension 08/19/2015  . Hyperlipidemia 08/19/2015  . Asthma 08/19/2015  . Atrial fibrillation ([place]) 08/19/2015  . Hyperthyroidism 08/19/2015  . Chronic systolic heart failure ([place])   . 3-vessel CAD   . Aortic stenosis     Danielle [name] 01/04/2017, 4:54 PM  [place], [place], [address] Phone: [phone]   Fax:  [phone]  Name: Danielle [name] MRN: [id number] Date of Birth: [birth year]  Serafina Royals, PT 01/04/17 4:54 PM

## 2017-01-04 NOTE — Telephone Encounter (Signed)
Dr. [name],  I will defer her coumadin peri-op management to you and her breast surgeon Dr. Donne Hazel. Please contact pt for your instruction, thanks.   [name] MD

## 2017-01-06 ENCOUNTER — Other Ambulatory Visit: Payer: Self-pay

## 2017-01-06 ENCOUNTER — Ambulatory Visit: Payer: Medicare Other | Admitting: Physical Therapy

## 2017-01-06 ENCOUNTER — Encounter: Payer: Self-pay | Admitting: Physical Therapy

## 2017-01-06 DIAGNOSIS — M25552 Pain in left hip: Secondary | ICD-10-CM

## 2017-01-06 DIAGNOSIS — M25562 Pain in left knee: Secondary | ICD-10-CM

## 2017-01-06 DIAGNOSIS — M6281 Muscle weakness (generalized): Secondary | ICD-10-CM | POA: Diagnosis not present

## 2017-01-06 DIAGNOSIS — R262 Difficulty in walking, not elsewhere classified: Secondary | ICD-10-CM

## 2017-01-06 DIAGNOSIS — R293 Abnormal posture: Secondary | ICD-10-CM

## 2017-01-06 DIAGNOSIS — Z170 Estrogen receptor positive status [ER+]: Secondary | ICD-10-CM | POA: Diagnosis not present

## 2017-01-06 DIAGNOSIS — G8929 Other chronic pain: Secondary | ICD-10-CM | POA: Diagnosis not present

## 2017-01-06 DIAGNOSIS — C50412 Malignant neoplasm of upper-outer quadrant of left female breast: Secondary | ICD-10-CM | POA: Diagnosis not present

## 2017-01-06 DIAGNOSIS — M25612 Stiffness of left shoulder, not elsewhere classified: Secondary | ICD-10-CM

## 2017-01-06 DIAGNOSIS — M25561 Pain in right knee: Secondary | ICD-10-CM | POA: Diagnosis not present

## 2017-01-06 NOTE — Telephone Encounter (Signed)
S/w pt per Dr Burr Medico note.

## 2017-01-06 NOTE — Therapy (Signed)
[place], [place], [address] Phone: [phone]   Fax:  [phone]  Physical Therapy Treatment  Patient Details  Name: Danielle [name] MRN: [id number] Date of Birth: [birth year] Referring Provider: Dr. Rolm Bookbinder   Encounter Date: 01/06/2017  PT End of Session - 01/06/17 1724    Visit Number  4    Number of Visits  8    Date for PT Re-Evaluation  01/19/17    PT Start Time  9201    PT Stop Time  1600    PT Time Calculation (min)  45 min    Activity Tolerance  Patient tolerated treatment well    Behavior During Therapy  [facility] for tasks assessed/performed       Past Medical History:  Diagnosis Date  . Allergy   . Aortic stenosis    Status post bioprosthetic AVR  . Arthritis    DJD  . Asthma   . Chronic systolic CHF (congestive heart failure) (HCC)    EF 30% 04/2014  . COPD (chronic obstructive pulmonary disease) ([place])   . Coronary artery disease   . Gastritis   . Hyperlipidemia   . Hypertension   . Hyperthyroidism   . Multiple thyroid nodules   . Osteoporosis   . Paroxysmal atrial fibrillation (HCC)   . Pelvis fracture ([place]) 08/19/2015   MULTIPLE   . Spontaneous pneumothorax 11/28/2015   left   . Stroke [facility])     Past Surgical History:  Procedure Laterality Date  . CHEST TUBE INSERTION  10/2015  . CORONARY ARTERY BYPASS GRAFT  04/2014  . THORACOSCOPY  12/03/2015  . VALVE REPLACEMENT      There were no vitals filed for this visit.  Subjective Assessment - 01/06/17 1530    Subjective  Pt states she did not have any extra soreness after last treatment     Pertinent History  Patient was diagnosed on 12/06/16 with left grade 2 invasive ductal carcinoma breast cancer. It measures 2.2 cm and is located in the upper outer quadrant. It is ER/PR positive and HER2 negative with a Ki67 of 30%. She has an extensive medical history including CVAs in 2000 and 2015, left hip  fracture from a fall requiring surgery in 10/17, and a pelvic fracture from a fall in 2/17. She also had cardiac bypass 2/16.    Patient Stated Goals  Stand > 2 minutes, walk to mailbox and in community, Get up from chair without UEs, reduce lymphedema risk, learn post op shoulder ROM HEP    Currently in Pain?  Yes    Pain Score  6     Pain Location  Knee    Pain Orientation  Left    Pain Descriptors / Indicators  Aching    Pain Type  Chronic pain    Pain Onset  More than a month ago    Pain Frequency  Intermittent                      [facility] Adult PT Treatment/Exercise - 01/06/17 0001      Ambulation/Gait   Pre-Gait Activities  standing and weight shift anterior and posterior       Lumbar Exercises: Supine   Bent Knee Raise  10 reps needs assist to raise left foot off mat     Bridge  10 reps      Knee/Hip Exercises: Standing   Functional Squat  10 reps from high mat with cues  for extra glute set at the top     Other Standing Knee Exercises  standing 30 second, 1 minute without hand support, then with feet staggered steps 1 minute with each leg forward.       Knee/Hip Exercises: Supine   Quad Sets  Strengthening;Right;Left;10 reps verbal and tactil cues for good isometrics set on left quad     Short Arc Quad Sets  AROM;Right;Left;3 sets;10 reps no weight, 1 #, 2#    Other Supine Knee/Hip Exercises  manual resistance for isometrics for abduction and adduction of left leg       Shoulder Exercises: Seated   Row  Strengthening;Right;Left;Both;10 reps;Theraband    Theraband Level (Shoulder Row)  Level 1 (Yellow)    Row Limitations  each arm unilaterally then both arms     Other Seated Exercises  elbow extension with yellow theraband 10 reps with each arm                      Long Term Clinic Goals - 12/22/16 1255      CC Long Term Goal  #1   Title  Patient will be independent in initial home exercise program    Time  4    Period  Weeks    Status  New       CC Long Term Goal  #2   Title  Patient will report she is able to stand >/= 5 minutes to do short kitchen tasks.    Time  4    Period  Weeks    Status  New      CC Long Term Goal  #3   Title  Patient will report she is able to walk to her mailbox and back without stopping due to fatigue to improve function.    Time  4    Period  Weeks    Status  New      CC Long Term Goal  #4   Title  Patient will increase left shoulder active abduction to >/= 130 degrees in supine to tolerate radiation positioning.    Time  4    Period  Weeks    Status  New      CC Long Term Goal  #5   Title  Patient will be able to perform sit to stand from a standard chair without use of UEs.    Time  4    Period  Weeks    Status  New         Plan - 01/06/17 1725    Clinical Impression Statement  Pt came in to PT on her walker today Pt works hard during the session.  She reiterated that she wants to want to walk with a cane.  Reinforeced quality quad setting today to help with leg strenth.  She said she will practice standing for a mintue at time several times a day at home     Rehab Potential  Good    PT Frequency  2x / week    PT Duration  4 weeks    PT Next Visit Plan  Start with pulleys.  Continue LE strengthening and balance exercises.  Repeat standing without UE support for time.       Patient will benefit from skilled therapeutic intervention in order to improve the following deficits and impairments:  Abnormal gait, Decreased activity tolerance, Decreased balance, Decreased mobility, Decreased strength, Postural dysfunction, Decreased knowledge of precautions, Pain, Impaired UE functional use,  Decreased endurance, Decreased range of motion, Difficulty walking  Visit Diagnosis: Abnormal posture  Difficulty in walking, not elsewhere classified  Muscle weakness (generalized)  Stiffness of left shoulder, not elsewhere classified  Pain in left hip  Chronic pain of left knee  Chronic  pain of right knee     Problem List Patient Active Problem List   Diagnosis Date Noted  . Malignant neoplasm of upper-outer quadrant of left breast in female, estrogen receptor positive ([place]) 12/16/2016  . Chronic knee pain 12/07/2016  . Chronic bilateral low back pain without sciatica 12/07/2016  . Peripheral musculoskeletal gait disorder 12/07/2016  . Encounter for therapeutic drug monitoring 06/16/2016  . Generalized osteoarthritis of multiple sites 05/04/2016  . Chronic anticoagulation 05/04/2016  . Stroke ([place]) 04/14/2016  . Aortic valve disease 04/10/2016  . S/P AVR 04/10/2016  . NICM (nonischemic cardiomyopathy) ([place]) 04/10/2016  . Upper airway cough syndrome 03/14/2016  . Morbid obesity due to excess calories ([place]) 03/14/2016  . COPD GOLD 0  03/11/2016  . Thrombocytopenia ([place]) 12/12/2015  . Anemia 12/12/2015  . Vitamin D deficiency 12/12/2015  . Acute urinary retention 12/11/2015  . Osteoporosis 12/11/2015  . Hip fracture ([place]) 12/10/2015  . Aortic atherosclerosis ([place]) 12/10/2015  . Lung blebs ([place]) 12/03/2015  . Pelvic fracture ([place]) 08/19/2015  . Fall at home 08/19/2015  . Essential hypertension 08/19/2015  . Hyperlipidemia 08/19/2015  . Asthma 08/19/2015  . Atrial fibrillation ([place]) 08/19/2015  . Hyperthyroidism 08/19/2015  . Chronic systolic heart failure ([place])   . 3-vessel CAD   . Aortic stenosis   Donato Heinz. Owens Shark PT   [place] Levo 01/06/2017, [place], [place], [address] Phone: [phone]   Fax:  [phone]  Name: Danielle [name] MRN: [id number] Date of Birth: 04-21-1945

## 2017-01-07 ENCOUNTER — Telehealth: Payer: Self-pay

## 2017-01-07 ENCOUNTER — Other Ambulatory Visit: Payer: Self-pay | Admitting: Family Medicine

## 2017-01-07 ENCOUNTER — Other Ambulatory Visit: Payer: Self-pay | Admitting: General Surgery

## 2017-01-07 DIAGNOSIS — C50512 Malignant neoplasm of lower-outer quadrant of left female breast: Secondary | ICD-10-CM

## 2017-01-07 DIAGNOSIS — Z170 Estrogen receptor positive status [ER+]: Principal | ICD-10-CM

## 2017-01-07 DIAGNOSIS — I480 Paroxysmal atrial fibrillation: Secondary | ICD-10-CM

## 2017-01-07 MED ORDER — ENOXAPARIN SODIUM 100 MG/ML ~~LOC~~ SOLN: 100.0000 mg | Freq: Two times a day (BID) | SUBCUTANEOUS | 0 refills | Status: DC

## 2017-01-07 NOTE — Telephone Encounter (Signed)
Alicia @ [facility]/Dr. Donne Hazel called to advise that pt is having a Left breast lumpectomy next week and will need a Lovenox bridge.   Dr. [name] - Please advise. Thanks!

## 2017-01-07 NOTE — Telephone Encounter (Signed)
° ° ° °  Pt calling to fup on when she need to stop her coumadin. She would like a call back today

## 2017-01-07 NOTE — Telephone Encounter (Signed)
[  She is on Coumadin because atrial fib, so her INR goal is 2-3. Last INR was 3.6]. She also follows with cardiologist, Dr End.  She can stop Coumadin 5 days before surgery. If possible INR a day before surgery can be done to be sure INR is < 2.0.  In regard to bridging anticoagulation,I usually do not recommend it because risk of bleeding. Because her risk thrombotic event is high, she can start Lovenox 100 mg [place] bid (based on her most recent wt: 100.8 Kg) 2 days after stopping Coumadin and 3 days before surgery. Stop Lovenox 24 hours before surgery and resume 24-48 hours after (depending of bleeding risk from surgical site) along with Coumadin (same dose). INR 2 days after resuming Coumadin.   Thanks, BJ

## 2017-01-07 NOTE — Telephone Encounter (Signed)
Pt following up on this request./ Pt's surgery is 11/16 at 11 am

## 2017-01-10 ENCOUNTER — Other Ambulatory Visit: Payer: Self-pay | Admitting: Family Medicine

## 2017-01-10 ENCOUNTER — Ambulatory Visit
Admission: RE | Admit: 2017-01-10 | Discharge: 2017-01-10 | Disposition: A | Discharge status: Home / Self Care | Payer: Medicare Other | Source: Ambulatory Visit | Attending: General Surgery | Admitting: General Surgery

## 2017-01-10 DIAGNOSIS — C50512 Malignant neoplasm of lower-outer quadrant of left female breast: Secondary | ICD-10-CM

## 2017-01-10 DIAGNOSIS — C50812 Malignant neoplasm of overlapping sites of left female breast: Secondary | ICD-10-CM | POA: Diagnosis not present

## 2017-01-10 DIAGNOSIS — Z170 Estrogen receptor positive status [ER+]: Principal | ICD-10-CM

## 2017-01-10 DIAGNOSIS — I4891 Unspecified atrial fibrillation: Secondary | ICD-10-CM

## 2017-01-10 NOTE — Telephone Encounter (Signed)
Pt called and states that she is supposed to come in today to pick up something about her "Novolog"... I do not see anything in her chart indicating this.   Noelle Penner - please follow up with pt to clarify. Thanks!

## 2017-01-10 NOTE — Telephone Encounter (Signed)
Apolonio Schneiders, CMA spoke with patient on Friday, 01/07/17.

## 2017-01-11 ENCOUNTER — Encounter: Payer: Self-pay | Admitting: Physical Therapy

## 2017-01-11 ENCOUNTER — Telehealth: Payer: Self-pay | Admitting: Family Medicine

## 2017-01-11 ENCOUNTER — Ambulatory Visit: Payer: Medicare Other | Admitting: Physical Therapy

## 2017-01-11 DIAGNOSIS — M25562 Pain in left knee: Secondary | ICD-10-CM

## 2017-01-11 DIAGNOSIS — M25612 Stiffness of left shoulder, not elsewhere classified: Secondary | ICD-10-CM | POA: Diagnosis not present

## 2017-01-11 DIAGNOSIS — R262 Difficulty in walking, not elsewhere classified: Secondary | ICD-10-CM

## 2017-01-11 DIAGNOSIS — G8929 Other chronic pain: Secondary | ICD-10-CM

## 2017-01-11 DIAGNOSIS — M25552 Pain in left hip: Secondary | ICD-10-CM

## 2017-01-11 DIAGNOSIS — M6281 Muscle weakness (generalized): Secondary | ICD-10-CM | POA: Diagnosis not present

## 2017-01-11 DIAGNOSIS — C50412 Malignant neoplasm of upper-outer quadrant of left female breast: Secondary | ICD-10-CM | POA: Diagnosis not present

## 2017-01-11 DIAGNOSIS — R293 Abnormal posture: Secondary | ICD-10-CM

## 2017-01-11 DIAGNOSIS — M25561 Pain in right knee: Secondary | ICD-10-CM | POA: Diagnosis not present

## 2017-01-11 DIAGNOSIS — Z170 Estrogen receptor positive status [ER+]: Secondary | ICD-10-CM | POA: Diagnosis not present

## 2017-01-11 NOTE — Telephone Encounter (Signed)
I am not sure about the question. Lovenox dose was calculated based on the last wt I could see, Lovenox 100 mg bid [place]  Thanks, BJ

## 2017-01-11 NOTE — Telephone Encounter (Signed)
Patient informed of instructions by Apolonio Schneiders, CMA.

## 2017-01-11 NOTE — Patient Instructions (Signed)
SHOULDER: Flexion - Supine (Cane)        Cancer Rehab [phone]    Hold cane in both hands. Raise arms up overhead. Do not allow back to arch. Hold _5__ seconds. Do __5-10__ times; __1-2__ times a day.   SELF ASSISTED WITH OBJECT: Shoulder Abduction / Adduction - Supine    Hold cane with both hands. Move both arms from side to side, keep elbows straight.  Hold when stretch felt for __5__ seconds. Repeat __5-10__ times; __1-2__ times a day. Once this becomes easier progress to third picture bringing affected arm towards ear by staying out to side. Same hold for _5_seconds. Repeat  _5-10_ times, _1-2_ times/day.  Shoulder Blade Stretch    Clasp fingers behind head with elbows touching in front of face. Pull elbows back while pressing shoulder blades together. Relax and hold as tolerated, can place pillow under elbow here for comfort as needed and to allow for prolonged stretch.  Repeat __5__ times. Do __1-2__ sessions per day.     SHOULDER: External Rotation - Supine (Cane)    Hold cane with both hands. Rotate arm away from body. Keep elbow on floor and next to body. _5-10__ reps per set, hold 5 seconds, _1-2__ sets per day. Add towel to keep elbow at side.  Copyright  VHI. All rights reserved.       

## 2017-01-11 NOTE — Telephone Encounter (Signed)
Patient came into clinic and picked up instructions for stopping coumadin and starting Lovenox.

## 2017-01-11 NOTE — Telephone Encounter (Signed)
Copied from [place] [id number]. Topic: Quick Communication - See Telephone Encounter >> Jan 11, 2017  9:56 AM [place], Laban Emperor wrote: CRM for notification. See Telephone encounter for:  Pt wanted to verify her infections for her enoxaparin (LOVENOX) 100 MG/ML injection [[id number]]. States what the nurse adv her and what the pharmacy adv her are two diff things and she just wanted to make sure.  01/11/17.

## 2017-01-11 NOTE — Therapy (Signed)
[place], [place], [address] Phone: [phone]   Fax:  [phone]  Physical Therapy Treatment  Patient Details  Name: Danielle [name] MRN: [id number] Date of Birth: 06-Jan-1946 Referring Provider: Dr. Rolm Bookbinder   Encounter Date: 01/11/2017  PT End of Session - 01/11/17 1719    Visit Number  5    Number of Visits  8    Date for PT Re-Evaluation  01/19/17    PT Start Time  1751    PT Stop Time  1600    PT Time Calculation (min)  45 min    Activity Tolerance  Patient tolerated treatment well    Behavior During Therapy  [facility] for tasks assessed/performed       Past Medical History:  Diagnosis Date  . Allergy   . Aortic stenosis    Status post bioprosthetic AVR  . Arthritis    DJD  . Asthma   . Chronic systolic CHF (congestive heart failure) (HCC)    EF 30% 04/2014  . COPD (chronic obstructive pulmonary disease) ([place])   . Coronary artery disease   . Gastritis   . Hyperlipidemia   . Hypertension   . Hyperthyroidism   . Multiple thyroid nodules   . Osteoporosis   . Paroxysmal atrial fibrillation (HCC)   . Pelvis fracture ([place]) 08/19/2015   MULTIPLE   . Spontaneous pneumothorax 11/28/2015   left   . Stroke [facility])     Past Surgical History:  Procedure Laterality Date  . CHEST TUBE INSERTION  10/2015  . CORONARY ARTERY BYPASS GRAFT  04/2014  . THORACOSCOPY  12/03/2015  . VALVE REPLACEMENT      There were no vitals filed for this visit.  Subjective Assessment - 01/11/17 1521    Subjective  My birthday is [birth year] and I have my surgery on Friday. She had the seed put in yesterday afternoon.  She said her thigh is was a little sore and still is but its getting better     Pertinent History  Patient was diagnosed on 12/06/16 with left grade 2 invasive ductal carcinoma breast cancer. It measures 2.2 cm and is located in the upper outer quadrant. It is ER/PR positive and  HER2 negative with a Ki67 of 30%. She has an extensive medical history including CVAs in 2000 and 2015, left hip fracture from a fall requiring surgery in 10/17, and a pelvic fracture from a fall in 2/17. She also had cardiac bypass 2/16.    Patient Stated Goals  Stand > 2 minutes, walk to mailbox and in community, Get up from chair without UEs, reduce lymphedema risk, learn post op shoulder ROM HEP    Currently in Pain?  Yes    Pain Score  4     Pain Location  -- top of thigh                       [facility] Adult PT Treatment/Exercise - 01/11/17 0001      Bed Mobility   Bed Mobility  Supine to Sit    Supine to Sit  6: Modified independent (Device/Increase time) needs extra time       Transfers   Comments  reviewed bed mobility and pivot transfers that pt will need to do post op       Knee/Hip Exercises: Standing   Other Standing Knee Exercises  standing for 6 minutes       Knee/Hip Exercises: Supine  Short Arc [employer]  AROM;Right;Left;3 sets;10 reps 4 # :  3 sets of 12 reps       Shoulder Exercises: Supine   Flexion  AAROM;Both;10 reps with dowel rod     Flexion Limitations  with dowel rod     ABduction  AAROM;Left;10 reps    Other Supine Exercises  hands behind head       Shoulder Exercises: Pulleys   Flexion  2 minutes    ABduction  1 minute             PT Education - 01/11/17 1716    Education provided  Yes    Education Details  supine dowel rod flexion , abduction and "beach pose' for post op exercise.              [facility] Goals - 01/11/17 1721      CC Long Term Goal  #1   Title  Patient will be independent in initial home exercise program    Status  Achieved      CC Long Term Goal  #2   Title  Patient will report she is able to stand >/= 5 minutes to do short kitchen tasks.    Status  Achieved      CC Long Term Goal  #3   Title  Patient will report she is able to walk to her mailbox and back without stopping due to fatigue to  improve function.    Time  4    Period  Weeks    Status  On-going      CC Long Term Goal  #4   Title  Patient will increase left shoulder active abduction to >/= 130 degrees in supine to tolerate radiation positioning.    Time  4    Period  Weeks    Status  On-going      CC Long Term Goal  #5   Title  Patient will be able to perform sit to stand from a standard chair without use of UEs.    Time  4    Period  Weeks    Status  On-going         Plan - 01/11/17 1719    Clinical Impression Statement  Pt is anticipating surgery this Friday and knows what exercises to do post op. She plans to return to PT once she receives approval from Dr. Donne Hazel after surgery     Clinical Impairments Affecting Rehab Potential  Multiple comorbidities    PT Next Visit Plan  Re-eval and restablish goals post lumpectomy        Patient will benefit from skilled therapeutic intervention in order to improve the following deficits and impairments:  Abnormal gait, Decreased activity tolerance, Decreased balance, Decreased mobility, Decreased strength, Postural dysfunction, Decreased knowledge of precautions, Pain, Impaired UE functional use, Decreased endurance, Decreased range of motion, Difficulty walking  Visit Diagnosis: Abnormal posture  Difficulty in walking, not elsewhere classified  Muscle weakness (generalized)  Stiffness of left shoulder, not elsewhere classified  Pain in left hip  Chronic pain of left knee  Chronic pain of right knee     Problem List Patient Active Problem List   Diagnosis Date Noted  . Malignant neoplasm of upper-outer quadrant of left breast in female, estrogen receptor positive ([place]) 12/16/2016  . Chronic knee pain 12/07/2016  . Chronic bilateral low back pain without sciatica 12/07/2016  . Peripheral musculoskeletal gait disorder 12/07/2016  . Encounter for therapeutic drug  monitoring 06/16/2016  . Generalized osteoarthritis of multiple sites 05/04/2016  .  Chronic anticoagulation 05/04/2016  . Stroke ([place]) 04/14/2016  . Aortic valve disease 04/10/2016  . S/P AVR 04/10/2016  . NICM (nonischemic cardiomyopathy) ([place]) 04/10/2016  . Upper airway cough syndrome 03/14/2016  . Morbid obesity due to excess calories ([place]) 03/14/2016  . COPD GOLD 0  03/11/2016  . Thrombocytopenia ([place]) 12/12/2015  . Anemia 12/12/2015  . Vitamin D deficiency 12/12/2015  . Acute urinary retention 12/11/2015  . Osteoporosis 12/11/2015  . Hip fracture ([place]) 12/10/2015  . Aortic atherosclerosis ([place]) 12/10/2015  . Lung blebs ([place]) 12/03/2015  . Pelvic fracture ([place]) 08/19/2015  . Fall at home 08/19/2015  . Essential hypertension 08/19/2015  . Hyperlipidemia 08/19/2015  . Asthma 08/19/2015  . Atrial fibrillation ([place]) 08/19/2015  . Hyperthyroidism 08/19/2015  . Chronic systolic heart failure ([place])   . 3-vessel CAD   . Aortic stenosis    Donato Heinz. Owens Shark PT  [place] Levo 01/11/2017, 5:23 PM  [place], [place], [address] Phone: [phone]   Fax:  [phone]  Name: Hidaya Daniel MRN: [id number] Date of Birth: 1945-09-30

## 2017-01-12 ENCOUNTER — Other Ambulatory Visit: Payer: Self-pay | Admitting: *Deleted

## 2017-01-12 ENCOUNTER — Other Ambulatory Visit: Payer: Self-pay

## 2017-01-12 ENCOUNTER — Encounter (HOSPITAL_COMMUNITY): Payer: Self-pay

## 2017-01-12 ENCOUNTER — Encounter (HOSPITAL_COMMUNITY)
Admission: RE | Admit: 2017-01-12 | Discharge: 2017-01-12 | Disposition: A | Discharge status: Home / Self Care | Payer: Medicare Other | Source: Ambulatory Visit | Attending: General Surgery | Admitting: General Surgery

## 2017-01-12 DIAGNOSIS — I4891 Unspecified atrial fibrillation: Secondary | ICD-10-CM | POA: Diagnosis not present

## 2017-01-12 DIAGNOSIS — Z7901 Long term (current) use of anticoagulants: Secondary | ICD-10-CM | POA: Diagnosis not present

## 2017-01-12 DIAGNOSIS — Z86010 Personal history of colonic polyps: Secondary | ICD-10-CM | POA: Diagnosis not present

## 2017-01-12 DIAGNOSIS — M545 Low back pain: Secondary | ICD-10-CM | POA: Diagnosis not present

## 2017-01-12 DIAGNOSIS — M159 Polyosteoarthritis, unspecified: Secondary | ICD-10-CM | POA: Diagnosis not present

## 2017-01-12 DIAGNOSIS — I110 Hypertensive heart disease with heart failure: Secondary | ICD-10-CM | POA: Diagnosis not present

## 2017-01-12 DIAGNOSIS — I739 Peripheral vascular disease, unspecified: Secondary | ICD-10-CM | POA: Diagnosis not present

## 2017-01-12 DIAGNOSIS — M25569 Pain in unspecified knee: Secondary | ICD-10-CM | POA: Diagnosis not present

## 2017-01-12 DIAGNOSIS — D649 Anemia, unspecified: Secondary | ICD-10-CM | POA: Diagnosis not present

## 2017-01-12 DIAGNOSIS — I350 Nonrheumatic aortic (valve) stenosis: Secondary | ICD-10-CM | POA: Diagnosis not present

## 2017-01-12 DIAGNOSIS — Z170 Estrogen receptor positive status [ER+]: Secondary | ICD-10-CM | POA: Diagnosis not present

## 2017-01-12 DIAGNOSIS — C50412 Malignant neoplasm of upper-outer quadrant of left female breast: Secondary | ICD-10-CM | POA: Diagnosis not present

## 2017-01-12 DIAGNOSIS — I358 Other nonrheumatic aortic valve disorders: Secondary | ICD-10-CM | POA: Diagnosis not present

## 2017-01-12 DIAGNOSIS — E559 Vitamin D deficiency, unspecified: Secondary | ICD-10-CM | POA: Diagnosis not present

## 2017-01-12 DIAGNOSIS — Z8673 Personal history of transient ischemic attack (TIA), and cerebral infarction without residual deficits: Secondary | ICD-10-CM | POA: Diagnosis not present

## 2017-01-12 DIAGNOSIS — E0590 Thyrotoxicosis, unspecified without thyrotoxic crisis or storm: Secondary | ICD-10-CM | POA: Diagnosis not present

## 2017-01-12 DIAGNOSIS — J449 Chronic obstructive pulmonary disease, unspecified: Secondary | ICD-10-CM | POA: Diagnosis not present

## 2017-01-12 DIAGNOSIS — Z87891 Personal history of nicotine dependence: Secondary | ICD-10-CM | POA: Diagnosis not present

## 2017-01-12 DIAGNOSIS — I5022 Chronic systolic (congestive) heart failure: Secondary | ICD-10-CM | POA: Diagnosis not present

## 2017-01-12 DIAGNOSIS — I2510 Atherosclerotic heart disease of native coronary artery without angina pectoris: Secondary | ICD-10-CM | POA: Diagnosis not present

## 2017-01-12 DIAGNOSIS — E785 Hyperlipidemia, unspecified: Secondary | ICD-10-CM | POA: Diagnosis not present

## 2017-01-12 DIAGNOSIS — K219 Gastro-esophageal reflux disease without esophagitis: Secondary | ICD-10-CM | POA: Diagnosis not present

## 2017-01-12 DIAGNOSIS — Z6835 Body mass index (BMI) 35.0-35.9, adult: Secondary | ICD-10-CM | POA: Diagnosis not present

## 2017-01-12 HISTORY — DX: Anxiety disorder, unspecified: F41.9

## 2017-01-12 LAB — BASIC METABOLIC PANEL
Anion gap: 9 (ref 5–15)
BUN: 14 mg/dL (ref 6–20)
CHLORIDE: 105 mmol/L (ref 101–111)
CO2: 23 mmol/L (ref 22–32)
Calcium: 9.8 mg/dL (ref 8.9–10.3)
Creatinine, Ser: 1.17 mg/dL — ABNORMAL HIGH (ref 0.44–1.00)
GFR calc non Af Amer: 46 mL/min — ABNORMAL LOW (ref >60)
GFR, EST AFRICAN AMERICAN: 53 mL/min — AB (ref >60)
Glucose, Bld: 89 mg/dL (ref 65–99)
POTASSIUM: 4.2 mmol/L (ref 3.5–5.1)
SODIUM: 137 mmol/L (ref 135–145)

## 2017-01-12 LAB — CBC
HEMATOCRIT: 40.3 % (ref 36.0–46.0)
Hemoglobin: 13.1 g/dL (ref 12.0–15.0)
MCH: 30.9 pg (ref 26.0–34.0)
MCHC: 32.5 g/dL (ref 30.0–36.0)
MCV: 95 fL (ref 78.0–100.0)
Platelets: 127 10*3/uL — ABNORMAL LOW (ref 150–400)
RBC: 4.24 MIL/uL (ref 3.87–5.11)
RDW: 13.4 % (ref 11.5–15.5)
WBC: 4.4 10*3/uL (ref 4.0–10.5)

## 2017-01-12 MED ORDER — ENSURE PRE-SURGERY PO LIQD: 296.0000 mL | Freq: Once | ORAL | Status: DC

## 2017-01-12 MED ORDER — ENSURE PRE-SURGERY PO LIQD: 592.0000 mL | Freq: Once | ORAL | Status: DC

## 2017-01-12 NOTE — Pre-Procedure Instructions (Addendum)
[facility]  01/12/2017      [facility] [place], [place] - [address] [place] [address] Phone: [phone] Fax: [phone]    Your procedure is scheduled on 01/14/17.  Report to [facility] Admitting at 1045 A.M.  Call this number if you have problems the morning of surgery:  [phone]   Remember:  Do not eat food or drink liquids after midnight. Except as instructed below: Drink ensure pre surgery drink am of surgery--complete before leave for hospital-drink down as quickly as can- do not just sip.   Take these medicines the morning of surgery with A SIP OF WATER        Albuterol inhaler if needed(bring to hospital),alprazolam (xanax) if needed,budesonide-formererol(symbicort) if needed,carvedilol(coreg),ranitidine(zantac) ,tramadol if needed  Stop coumadin per dr 01/09/17(last dose) and start lovenox 01/11/17(per dr) stop lovenox 24 hrs prior to surgery (01/13/17 am last dose)   Do not wear jewelry, make-up or nail polish.  Do not wear lotions, powders, or perfumes, or deoderant.  Do not shave 48 hours prior to surgery.  Men may shave face and neck.  Do not bring valuables to the hospital.  [facility] is not responsible for any belongings or valuables.  Contacts, dentures or bridgework may not be worn into surgery.  Leave your suitcase in the car.  After surgery it may be brought to your room.  For patients admitted to the hospital, discharge time will be determined by your treatment team.  Patients discharged the day of surgery will not be allowed to drive home.   Special instructions:  Special Instructions: [place] - Preparing for Surgery  Before surgery, you can play an important role.  Because skin is not sterile, your skin needs to be as free of germs as possible.  You can reduce the number of germs on you skin by washing with CHG (chlorahexidine gluconate) soap before  surgery.  CHG is an antiseptic cleaner which kills germs and bonds with the skin to continue killing germs even after washing.  Please DO NOT use if you have an allergy to CHG or antibacterial soaps.  If your skin becomes reddened/irritated stop using the CHG and inform your nurse when you arrive at Short Stay.  Do not shave (including legs and underarms) for at least 48 hours prior to the first CHG shower.  You may shave your face.  Please follow these instructions carefully:   1.  Shower with CHG Soap the night before surgery and the morning of Surgery.  2.  If you choose to wash your hair, wash your hair first as usual with your normal shampoo.  3.  After you shampoo, rinse your hair and body thoroughly to remove the Shampoo.  4.  Use CHG as you would any other liquid soap.  You can apply chg directly  to the skin and wash gently with scrungie or a clean washcloth.  5.  Apply the CHG Soap to your body ONLY FROM THE NECK DOWN.  Do not use on open wounds or open sores.  Avoid contact with your eyes ears, mouth and genitals (private parts).  Wash genitals (private parts)       with your normal soap.  6.  Wash thoroughly, paying special attention to the area where your surgery will be performed.  7.  Thoroughly rinse your body with warm water from the neck down.  8.  DO NOT shower/wash with your normal soap  after using and rinsing off the CHG Soap.  9.  Pat yourself dry with a clean towel.            10.  Wear clean pajamas.            11.  Place clean sheets on your bed the night of your first shower and do not sleep with pets.  Day of Surgery  Do not apply any lotions/deodorants the morning of surgery.  Please wear clean clothes to the hospital/surgery center.  Please read over the  fact sheets that you were given.

## 2017-01-12 NOTE — Patient Outreach (Signed)
[facility]) Care Management  01/12/2017  Danielle [name] 03-10-1945 [id number]  Telephone Screen  Referral Date: 01/12/17 Referral Source: EMMI Prevent Referral Reason: COPD, HTN, Irregular Heart Rhythm/Atrial Fibrillation, Heart Disease, Heart Failure, Cancer Insurance: [facility] Medicare   Outreach attempt #1 to patient. No answer. RN CM left HIPAA compliant message along with contact info.    Plan: RN CM will contact patient within one week.   Lake Bells, RN, BSN, MHA/MSL, [place] Telephonic Care Manager Coordinator [facility] Direct Phone: [phone] Toll Free: [phone] Fax: [phone]

## 2017-01-13 ENCOUNTER — Encounter: Payer: Self-pay | Admitting: Physical Therapy

## 2017-01-13 ENCOUNTER — Ambulatory Visit: Payer: Self-pay | Admitting: *Deleted

## 2017-01-13 ENCOUNTER — Encounter (HOSPITAL_COMMUNITY): Payer: Self-pay

## 2017-01-13 ENCOUNTER — Other Ambulatory Visit: Payer: Self-pay | Admitting: Licensed Clinical Social Worker

## 2017-01-13 ENCOUNTER — Other Ambulatory Visit: Payer: Self-pay

## 2017-01-13 ENCOUNTER — Other Ambulatory Visit: Payer: Self-pay | Admitting: *Deleted

## 2017-01-13 NOTE — Progress Notes (Signed)
Anesthesia Chart Review:  Pt is a 71 year old female scheduled for L breast lumpectomy with radioactive seed and sentinel lymph node biopsy on 01/14/2017 with Rolm Bookbinder, MD  - PCP is [name], MD - Cardiologist is [name], MD who cleared pt for surgery in comment on 12/31/16 echo results.   PMH includes:  CAD (per pt, had LHC prior to AVR in [place] that did not show any blockages; no stents/bypass), CHF, PAF, aortic stenosis (s/p bioprosthetic AVR in [place] 03/2014), stroke, HTN, hyperlipidemia, hyperthyroidism, thyroid nodules, asthma, COPD, bullous emphysema, spontaneous pneumothorax (2017). Former smoker. BMI 35. S/p L IM nail 12/10/15. S/p L blebectomy, pleuradesis 12/03/15  Medications include: Albuterol, ASA 81 mg, Lipitor, Symbicort, carvedilol, Lovenox, Lasix, losartan, Tapazole, Zantac, Coumadin. Last dose coumadin 01/10/17. Lovenox started 01/11/17, will be stopped 24 hours before surgery.   BP 129/81   Pulse 76   Temp 36.7 C   Resp 18   Ht 5\' 7"  (1.702 m)   Wt 223 lb 14.4 oz (101.6 kg)   SpO2 100%   BMI 35.07 kg/m   Preoperative labs reviewed.  - PT/INR will be obtained day of surgery  EKG 07/08/16: NSR. Inferior infarct, age undetermined. Anterolateral infarct, age undetermined.   Echo 12/31/16:  - Left ventricle: The cavity size was normal. There was moderate concentric hypertrophy. Systolic function was moderately reduced. The estimated ejection fraction was in the range of 35% to 40%. Wall motion was normal; there were no regional wall motion abnormalities. Doppler parameters are consistent with restrictive physiology, indicative of decreased left ventricular diastolic compliance and/or increased left atrial pressure. Doppler parameters are consistent with elevated ventricular end-diastolic filling pressure. - Ventricular septum: Septal motion showed paradox. - Aortic valve: Mean gradient (S): 9 mm Hg. Peak gradient (S): 17 mm Hg. - Left atrium: The  atrium was mildly dilated. - Right atrium: The atrium was normal in size. - Pericardium, extracardiac: There was no pericardial effusion. - Impressions: No significant change since the prior study. LVEF remained moderately decreased at 35-40% with diffuse hypokinesis and assynchronous septal motion. Bioprosthetic valve sits well in the aortic position. There are normal transaortic gradients and no aortic regurgitation or paravalvular leak.  If labs acceptable day of surgery, I anticipate pt can proceed with surgery as scheduled.   Willeen Cass, FNP-BC [facility]/Anesthesiology Phone: [phone] 01/13/2017 12:03 PM

## 2017-01-13 NOTE — Patient Outreach (Signed)
[place] [facility]) Care Management  01/13/2017  Ronna Herskowitz 1945/06/25 [id number]  Assessment- CSW received new referral on patient. Referral states: Patient needs assistance with transportation. She will be having radiation daily times 4 weeks, in the near future. Patient's surgery for removal of a mass in her breast is scheduled for 01/14/17. CSW completed initial outreach to patient on 01/13/17 and was able to reach her successfully. HIPPA verifications were received. CSW introduced self, reason for call and of [facility] social work services. Patient reports her main need is transportation. Patient reports that her spouse can provide transportation but she does not want to rely on him because "he lost out on two interviews because he had to take me somewhere." CSW provided education on available transportation resources within the area. Patient is interested in both [employer] and [employer]. Patient reports that CSW can contact her next week to see how she is feeling in order to schedule initial home visit and complete [employer] application. Patient reports that radiation has not been scheduled yet.  [facility] CM Care Plan Problem One     Most Recent Value  Care Plan Problem One  Lack of stable transportation to medical appointments  Role Documenting the Problem One  Clinical Social Worker  Care Plan for Problem One  Active  [facility] Long Term Goal   Patient will gain stable transportation within 90 days per patient report. Patient has upcoming radiation appointments and has no source of transportation at this time  [place] Start Date  01/14/17  Interventions for Problem One Long Term Goal  CSW will complete home visit after patient's surgery. CSW will provide printed handout of transportation resources. CSW will complete appropriate referrals as needed as well as applications. CSW will monitor entire process and follow up as needed.      Plan-CSW will send involvement letter to  PCP. CSW will assist patient with gaining stable transportation.  [name], BSW, [email or web address], [email or web address] Phone: [phone] Fax: [phone]

## 2017-01-13 NOTE — Patient Outreach (Signed)
[facility]) Care Management  01/13/2017  Danielle [name] May 07, 1945 [id number]  Telephone Screen  Referral Date: 01/12/17 Referral Source: EMMI Prevent Referral Reason: COPD, HTN, Irregular Heart Rhythm/Atrial Fibrillation, Heart Disease, Heart Failure, Cancer Insurance: [facility] Medicare  Patient has past medical history of Heart Failure, HTN, CVA, COPD, Osteoporosis, and Atrial Fibrillation. Patient was diagnosed on 12/16/16 with a suspicious mass in her left breast. Patient had a biopsy performed on 12/13/16, which was positive for a malignant neoplasm. She will be having radiation daily times 4 weeks, in the near future. Patient's surgery is scheduled for 01/14/17 for removal of a mass in her breast. She will be admitted to hospital overnight in observation status due to having "heart conditions". Patient verbalized needing transportation to radiation. Her spouse works 6:30 - 2:30 daily. [facility] services and benefits explained to patient. Patient agreed to services.   Plan: RN CM advised patient to contact RNCM for any needs or concerns. RN CM will send referral to [facility] RN for further in home eval/assessment of care needs and management of chronic conditions. RN CM will send [place] [facility] referral for possible assistance with community resources related to transportation.  Lake Bells, RN, BSN, MHA/MSL, [place] Telephonic Care Manager Coordinator [facility] Direct Phone: [phone] Toll Free: [phone] Fax: [phone]

## 2017-01-14 ENCOUNTER — Encounter (HOSPITAL_COMMUNITY)
Admission: RE | Disposition: A | Discharge status: Home / Self Care | Payer: Self-pay | Source: Ambulatory Visit | Attending: General Surgery

## 2017-01-14 ENCOUNTER — Ambulatory Visit (HOSPITAL_COMMUNITY): Payer: Medicare Other | Admitting: Emergency Medicine

## 2017-01-14 ENCOUNTER — Encounter (HOSPITAL_COMMUNITY): Payer: Self-pay | Admitting: *Deleted

## 2017-01-14 ENCOUNTER — Observation Stay (HOSPITAL_COMMUNITY)
Admission: RE | Admit: 2017-01-14 | Discharge: 2017-01-15 | Disposition: A | Discharge status: Home / Self Care | Payer: Medicare Other | Source: Ambulatory Visit | Attending: General Surgery | Admitting: General Surgery

## 2017-01-14 ENCOUNTER — Ambulatory Visit
Admission: RE | Admit: 2017-01-14 | Discharge: 2017-01-14 | Disposition: A | Discharge status: Home / Self Care | Payer: Medicare Other | Source: Ambulatory Visit | Attending: General Surgery | Admitting: General Surgery

## 2017-01-14 ENCOUNTER — Ambulatory Visit (HOSPITAL_COMMUNITY): Payer: Medicare Other | Admitting: Certified Registered"

## 2017-01-14 ENCOUNTER — Encounter (HOSPITAL_COMMUNITY)
Admission: RE | Admit: 2017-01-14 | Discharge: 2017-01-14 | Disposition: A | Discharge status: Home / Self Care | Payer: Medicare Other | Source: Ambulatory Visit | Attending: General Surgery | Admitting: General Surgery

## 2017-01-14 DIAGNOSIS — Z8673 Personal history of transient ischemic attack (TIA), and cerebral infarction without residual deficits: Secondary | ICD-10-CM | POA: Insufficient documentation

## 2017-01-14 DIAGNOSIS — Z887 Allergy status to serum and vaccine status: Secondary | ICD-10-CM | POA: Insufficient documentation

## 2017-01-14 DIAGNOSIS — C50512 Malignant neoplasm of lower-outer quadrant of left female breast: Secondary | ICD-10-CM

## 2017-01-14 DIAGNOSIS — R928 Other abnormal and inconclusive findings on diagnostic imaging of breast: Secondary | ICD-10-CM | POA: Diagnosis not present

## 2017-01-14 DIAGNOSIS — I4891 Unspecified atrial fibrillation: Secondary | ICD-10-CM | POA: Diagnosis not present

## 2017-01-14 DIAGNOSIS — E785 Hyperlipidemia, unspecified: Secondary | ICD-10-CM | POA: Diagnosis not present

## 2017-01-14 DIAGNOSIS — D649 Anemia, unspecified: Secondary | ICD-10-CM | POA: Diagnosis not present

## 2017-01-14 DIAGNOSIS — Z86010 Personal history of colonic polyps: Secondary | ICD-10-CM | POA: Diagnosis not present

## 2017-01-14 DIAGNOSIS — I110 Hypertensive heart disease with heart failure: Secondary | ICD-10-CM | POA: Diagnosis not present

## 2017-01-14 DIAGNOSIS — Z170 Estrogen receptor positive status [ER+]: Principal | ICD-10-CM

## 2017-01-14 DIAGNOSIS — C50412 Malignant neoplasm of upper-outer quadrant of left female breast: Secondary | ICD-10-CM | POA: Diagnosis not present

## 2017-01-14 DIAGNOSIS — Z79899 Other long term (current) drug therapy: Secondary | ICD-10-CM | POA: Insufficient documentation

## 2017-01-14 DIAGNOSIS — M159 Polyosteoarthritis, unspecified: Secondary | ICD-10-CM | POA: Insufficient documentation

## 2017-01-14 DIAGNOSIS — Z87891 Personal history of nicotine dependence: Secondary | ICD-10-CM | POA: Insufficient documentation

## 2017-01-14 DIAGNOSIS — Z7901 Long term (current) use of anticoagulants: Secondary | ICD-10-CM | POA: Insufficient documentation

## 2017-01-14 DIAGNOSIS — Z6835 Body mass index (BMI) 35.0-35.9, adult: Secondary | ICD-10-CM | POA: Insufficient documentation

## 2017-01-14 DIAGNOSIS — J449 Chronic obstructive pulmonary disease, unspecified: Secondary | ICD-10-CM | POA: Diagnosis not present

## 2017-01-14 DIAGNOSIS — G8918 Other acute postprocedural pain: Secondary | ICD-10-CM | POA: Diagnosis not present

## 2017-01-14 DIAGNOSIS — I5022 Chronic systolic (congestive) heart failure: Secondary | ICD-10-CM | POA: Diagnosis not present

## 2017-01-14 DIAGNOSIS — E559 Vitamin D deficiency, unspecified: Secondary | ICD-10-CM | POA: Insufficient documentation

## 2017-01-14 DIAGNOSIS — Z952 Presence of prosthetic heart valve: Secondary | ICD-10-CM | POA: Insufficient documentation

## 2017-01-14 DIAGNOSIS — K219 Gastro-esophageal reflux disease without esophagitis: Secondary | ICD-10-CM | POA: Insufficient documentation

## 2017-01-14 DIAGNOSIS — I739 Peripheral vascular disease, unspecified: Secondary | ICD-10-CM | POA: Insufficient documentation

## 2017-01-14 DIAGNOSIS — I350 Nonrheumatic aortic (valve) stenosis: Secondary | ICD-10-CM | POA: Diagnosis not present

## 2017-01-14 DIAGNOSIS — E0590 Thyrotoxicosis, unspecified without thyrotoxic crisis or storm: Secondary | ICD-10-CM | POA: Diagnosis not present

## 2017-01-14 DIAGNOSIS — I2510 Atherosclerotic heart disease of native coronary artery without angina pectoris: Secondary | ICD-10-CM | POA: Insufficient documentation

## 2017-01-14 DIAGNOSIS — M545 Low back pain: Secondary | ICD-10-CM | POA: Insufficient documentation

## 2017-01-14 DIAGNOSIS — I358 Other nonrheumatic aortic valve disorders: Secondary | ICD-10-CM | POA: Insufficient documentation

## 2017-01-14 DIAGNOSIS — Z888 Allergy status to other drugs, medicaments and biological substances status: Secondary | ICD-10-CM | POA: Insufficient documentation

## 2017-01-14 DIAGNOSIS — D0512 Intraductal carcinoma in situ of left breast: Secondary | ICD-10-CM | POA: Diagnosis not present

## 2017-01-14 DIAGNOSIS — M25569 Pain in unspecified knee: Secondary | ICD-10-CM | POA: Insufficient documentation

## 2017-01-14 DIAGNOSIS — C50912 Malignant neoplasm of unspecified site of left female breast: Secondary | ICD-10-CM | POA: Diagnosis not present

## 2017-01-14 HISTORY — PX: BREAST LUMPECTOMY WITH RADIOACTIVE SEED AND SENTINEL LYMPH NODE BIOPSY: SHX6550

## 2017-01-14 LAB — PROTIME-INR
INR: 1.06
Prothrombin Time: 13.7 seconds (ref 11.4–15.2)

## 2017-01-14 SURGERY — BREAST LUMPECTOMY WITH RADIOACTIVE SEED AND SENTINEL LYMPH NODE BIOPSY
Anesthesia: Regional | Site: Breast | Laterality: Left

## 2017-01-14 MED ORDER — FENTANYL CITRATE (PF) 100 MCG/2ML IJ SOLN
INTRAMUSCULAR | Status: AC
  Administered 2017-01-14: 50 ug via INTRAVENOUS
  Filled 2017-01-14: qty 2

## 2017-01-14 MED ORDER — MEPERIDINE HCL 25 MG/ML IJ SOLN: 6.2500 mg | INTRAMUSCULAR | Status: DC | PRN

## 2017-01-14 MED ORDER — OXYCODONE HCL 5 MG PO TABS
ORAL_TABLET | ORAL | Status: AC
Start: 2017-01-14 — End: 2017-01-15
  Filled 2017-01-14: qty 1

## 2017-01-14 MED ORDER — GABAPENTIN 300 MG PO CAPS
300.0000 mg | ORAL_CAPSULE | ORAL | Status: AC
  Administered 2017-01-14: 300 mg via ORAL
  Filled 2017-01-14: qty 1

## 2017-01-14 MED ORDER — MIDAZOLAM HCL 5 MG/5ML IJ SOLN
INTRAMUSCULAR | Status: DC | PRN
  Administered 2017-01-14 (×2): 1 mg via INTRAVENOUS

## 2017-01-14 MED ORDER — 0.9 % SODIUM CHLORIDE (POUR BTL) OPTIME
TOPICAL | Status: DC | PRN
  Administered 2017-01-14: 1000 mL

## 2017-01-14 MED ORDER — LIDOCAINE 2% (20 MG/ML) 5 ML SYRINGE
INTRAMUSCULAR | Status: DC | PRN
  Administered 2017-01-14: 60 mg via INTRAVENOUS

## 2017-01-14 MED ORDER — HEMOSTATIC AGENTS (NO CHARGE) OPTIME
TOPICAL | Status: DC | PRN
  Administered 2017-01-14: 1

## 2017-01-14 MED ORDER — MOMETASONE FURO-FORMOTEROL FUM 200-5 MCG/ACT IN AERO
2.0000 | INHALATION_SPRAY | Freq: Two times a day (BID) | RESPIRATORY_TRACT | Status: DC
  Administered 2017-01-14 – 2017-01-15 (×2): 2 via RESPIRATORY_TRACT
  Filled 2017-01-14: qty 8.8

## 2017-01-14 MED ORDER — SODIUM CHLORIDE 0.9 % IJ SOLN
INTRAMUSCULAR | Status: DC | PRN
  Administered 2017-01-14: 5 mL via INTRAMUSCULAR

## 2017-01-14 MED ORDER — ASPIRIN EC 81 MG PO TBEC
81.0000 mg | DELAYED_RELEASE_TABLET | Freq: Every day | ORAL | Status: DC
  Administered 2017-01-15: 81 mg via ORAL
  Filled 2017-01-14: qty 1

## 2017-01-14 MED ORDER — SENNOSIDES-DOCUSATE SODIUM 8.6-50 MG PO TABS
2.0000 | ORAL_TABLET | Freq: Every day | ORAL | Status: DC
  Administered 2017-01-14: 2 via ORAL
  Filled 2017-01-14: qty 2

## 2017-01-14 MED ORDER — TECHNETIUM TC 99M SULFUR COLLOID FILTERED
1.0000 | Freq: Once | INTRAVENOUS | Status: AC | PRN
  Administered 2017-01-14: 1 via INTRADERMAL

## 2017-01-14 MED ORDER — SODIUM CHLORIDE 0.9 % IV SOLN
INTRAVENOUS | Status: DC
  Administered 2017-01-14: 22:00:00 via INTRAVENOUS

## 2017-01-14 MED ORDER — PHENYLEPHRINE 40 MCG/ML (10ML) SYRINGE FOR IV PUSH (FOR BLOOD PRESSURE SUPPORT)
PREFILLED_SYRINGE | INTRAVENOUS | Status: DC | PRN
  Administered 2017-01-14 (×3): 120 ug via INTRAVENOUS

## 2017-01-14 MED ORDER — ACETAMINOPHEN 500 MG PO TABS
1000.0000 mg | ORAL_TABLET | ORAL | Status: AC
  Administered 2017-01-14: 1000 mg via ORAL
  Filled 2017-01-14: qty 2

## 2017-01-14 MED ORDER — DEXAMETHASONE SODIUM PHOSPHATE 10 MG/ML IJ SOLN
INTRAMUSCULAR | Status: DC | PRN
  Administered 2017-01-14: 5 mg via INTRAVENOUS

## 2017-01-14 MED ORDER — WARFARIN - PHARMACIST DOSING INPATIENT: Freq: Every day | Status: DC

## 2017-01-14 MED ORDER — CHLORHEXIDINE GLUCONATE CLOTH 2 % EX PADS: 6.0000 | MEDICATED_PAD | Freq: Once | CUTANEOUS | Status: DC

## 2017-01-14 MED ORDER — METHIMAZOLE 5 MG PO TABS: 5.0000 mg | ORAL_TABLET | Freq: Two times a day (BID) | ORAL | Status: DC

## 2017-01-14 MED ORDER — ROPIVACAINE HCL 7.5 MG/ML IJ SOLN
INTRAMUSCULAR | Status: DC | PRN
  Administered 2017-01-14: 20 mL via PERINEURAL

## 2017-01-14 MED ORDER — PROMETHAZINE HCL 25 MG/ML IJ SOLN: 6.2500 mg | INTRAMUSCULAR | Status: DC | PRN

## 2017-01-14 MED ORDER — ACETAMINOPHEN 500 MG PO TABS
1000.0000 mg | ORAL_TABLET | Freq: Four times a day (QID) | ORAL | Status: DC
  Administered 2017-01-14 – 2017-01-15 (×2): 1000 mg via ORAL
  Filled 2017-01-14 (×3): qty 2

## 2017-01-14 MED ORDER — CARVEDILOL 25 MG PO TABS
25.0000 mg | ORAL_TABLET | Freq: Two times a day (BID) | ORAL | Status: DC
  Administered 2017-01-14 – 2017-01-15 (×2): 25 mg via ORAL
  Filled 2017-01-14 (×2): qty 1

## 2017-01-14 MED ORDER — METHIMAZOLE 5 MG PO TABS
5.0000 mg | ORAL_TABLET | Freq: Every day | ORAL | Status: DC
  Administered 2017-01-15: 5 mg via ORAL
  Filled 2017-01-14: qty 1

## 2017-01-14 MED ORDER — DEXAMETHASONE SODIUM PHOSPHATE 10 MG/ML IJ SOLN
INTRAMUSCULAR | Status: AC
  Filled 2017-01-14: qty 1

## 2017-01-14 MED ORDER — OXYCODONE HCL 5 MG PO TABS: 5.0000 mg | ORAL_TABLET | Freq: Four times a day (QID) | ORAL | 0 refills | Status: DC | PRN

## 2017-01-14 MED ORDER — MORPHINE SULFATE (PF) 4 MG/ML IV SOLN: 2.0000 mg | INTRAVENOUS | Status: DC | PRN

## 2017-01-14 MED ORDER — ONDANSETRON 4 MG PO TBDP: 4.0000 mg | ORAL_TABLET | Freq: Four times a day (QID) | ORAL | Status: DC | PRN

## 2017-01-14 MED ORDER — FENTANYL CITRATE (PF) 100 MCG/2ML IJ SOLN
25.0000 ug | INTRAMUSCULAR | Status: DC | PRN
  Administered 2017-01-14 (×2): 50 ug via INTRAVENOUS

## 2017-01-14 MED ORDER — LOSARTAN POTASSIUM 50 MG PO TABS
25.0000 mg | ORAL_TABLET | Freq: Every day | ORAL | Status: DC
  Administered 2017-01-15: 25 mg via ORAL
  Filled 2017-01-14: qty 1

## 2017-01-14 MED ORDER — WARFARIN SODIUM 5 MG PO TABS
5.0000 mg | ORAL_TABLET | ORAL | Status: AC
  Administered 2017-01-14: 5 mg via ORAL
  Filled 2017-01-14: qty 1

## 2017-01-14 MED ORDER — ALBUTEROL SULFATE (2.5 MG/3ML) 0.083% IN NEBU: 2.5000 mg | INHALATION_SOLUTION | Freq: Four times a day (QID) | RESPIRATORY_TRACT | Status: DC | PRN

## 2017-01-14 MED ORDER — ATORVASTATIN CALCIUM 40 MG PO TABS: 40.0000 mg | ORAL_TABLET | Freq: Every day | ORAL | Status: DC

## 2017-01-14 MED ORDER — MIDAZOLAM HCL 2 MG/2ML IJ SOLN: 0.5000 mg | Freq: Once | INTRAMUSCULAR | Status: DC | PRN

## 2017-01-14 MED ORDER — ONDANSETRON HCL 4 MG/2ML IJ SOLN
INTRAMUSCULAR | Status: DC | PRN
  Administered 2017-01-14: 4 mg via INTRAVENOUS

## 2017-01-14 MED ORDER — FENTANYL CITRATE (PF) 100 MCG/2ML IJ SOLN
INTRAMUSCULAR | Status: DC | PRN
  Administered 2017-01-14: 25 ug via INTRAVENOUS
  Administered 2017-01-14 (×2): 50 ug via INTRAVENOUS
  Administered 2017-01-14 (×2): 25 ug via INTRAVENOUS

## 2017-01-14 MED ORDER — FAMOTIDINE 20 MG PO TABS
20.0000 mg | ORAL_TABLET | Freq: Two times a day (BID) | ORAL | Status: DC
  Administered 2017-01-14 – 2017-01-15 (×2): 20 mg via ORAL
  Filled 2017-01-14 (×2): qty 1

## 2017-01-14 MED ORDER — SODIUM CHLORIDE 0.9 % IJ SOLN
INTRAMUSCULAR | Status: AC
  Filled 2017-01-14: qty 10

## 2017-01-14 MED ORDER — EPHEDRINE 5 MG/ML INJ
INTRAVENOUS | Status: AC
  Filled 2017-01-14: qty 10

## 2017-01-14 MED ORDER — ALPRAZOLAM 0.25 MG PO TABS
0.2500 mg | ORAL_TABLET | Freq: Three times a day (TID) | ORAL | Status: DC | PRN
  Administered 2017-01-14: 0.25 mg via ORAL
  Filled 2017-01-14: qty 1

## 2017-01-14 MED ORDER — FUROSEMIDE 20 MG PO TABS
20.0000 mg | ORAL_TABLET | Freq: Every day | ORAL | Status: DC
  Administered 2017-01-15: 20 mg via ORAL
  Filled 2017-01-14: qty 1

## 2017-01-14 MED ORDER — MIDAZOLAM HCL 2 MG/2ML IJ SOLN
1.0000 mg | Freq: Once | INTRAMUSCULAR | Status: AC
  Administered 2017-01-14: 1 mg via INTRAVENOUS

## 2017-01-14 MED ORDER — ENOXAPARIN SODIUM 100 MG/ML ~~LOC~~ SOLN: 100.0000 mg | Freq: Two times a day (BID) | SUBCUTANEOUS | Status: DC

## 2017-01-14 MED ORDER — ONDANSETRON HCL 4 MG/2ML IJ SOLN
INTRAMUSCULAR | Status: AC
  Filled 2017-01-14: qty 2

## 2017-01-14 MED ORDER — OXYCODONE HCL 5 MG PO TABS
5.0000 mg | ORAL_TABLET | Freq: Four times a day (QID) | ORAL | Status: DC | PRN
  Administered 2017-01-14 – 2017-01-15 (×2): 5 mg via ORAL
  Filled 2017-01-14: qty 1

## 2017-01-14 MED ORDER — METHOCARBAMOL 500 MG PO TABS
500.0000 mg | ORAL_TABLET | Freq: Four times a day (QID) | ORAL | Status: DC | PRN
  Administered 2017-01-15: 500 mg via ORAL
  Filled 2017-01-14: qty 1

## 2017-01-14 MED ORDER — PHENYLEPHRINE 40 MCG/ML (10ML) SYRINGE FOR IV PUSH (FOR BLOOD PRESSURE SUPPORT)
PREFILLED_SYRINGE | INTRAVENOUS | Status: AC
  Filled 2017-01-14: qty 10

## 2017-01-14 MED ORDER — FENTANYL CITRATE (PF) 100 MCG/2ML IJ SOLN
50.0000 ug | Freq: Once | INTRAMUSCULAR | Status: AC
  Administered 2017-01-14: 50 ug via INTRAVENOUS

## 2017-01-14 MED ORDER — PROPOFOL 10 MG/ML IV BOLUS
INTRAVENOUS | Status: DC | PRN
  Administered 2017-01-14: 170 mg via INTRAVENOUS

## 2017-01-14 MED ORDER — METHIMAZOLE 10 MG PO TABS
10.0000 mg | ORAL_TABLET | Freq: Every day | ORAL | Status: DC
  Administered 2017-01-14: 10 mg via ORAL
  Filled 2017-01-14: qty 1

## 2017-01-14 MED ORDER — MIDAZOLAM HCL 2 MG/2ML IJ SOLN
INTRAMUSCULAR | Status: AC
  Filled 2017-01-14: qty 2

## 2017-01-14 MED ORDER — PROPOFOL 10 MG/ML IV BOLUS
INTRAVENOUS | Status: AC
  Filled 2017-01-14: qty 20

## 2017-01-14 MED ORDER — FENTANYL CITRATE (PF) 250 MCG/5ML IJ SOLN
INTRAMUSCULAR | Status: AC
  Filled 2017-01-14: qty 5

## 2017-01-14 MED ORDER — LIDOCAINE 2% (20 MG/ML) 5 ML SYRINGE
INTRAMUSCULAR | Status: AC
  Filled 2017-01-14: qty 5

## 2017-01-14 MED ORDER — METHYLENE BLUE 0.5 % INJ SOLN
INTRAVENOUS | Status: AC
  Filled 2017-01-14: qty 10

## 2017-01-14 MED ORDER — EPHEDRINE SULFATE-NACL 50-0.9 MG/10ML-% IV SOSY
PREFILLED_SYRINGE | INTRAVENOUS | Status: DC | PRN
  Administered 2017-01-14: 5 mg via INTRAVENOUS

## 2017-01-14 MED ORDER — CEFAZOLIN SODIUM-DEXTROSE 2-4 GM/100ML-% IV SOLN
2.0000 g | INTRAVENOUS | Status: AC
  Administered 2017-01-14: 2 g via INTRAVENOUS
  Filled 2017-01-14: qty 100

## 2017-01-14 MED ORDER — MIDAZOLAM HCL 2 MG/2ML IJ SOLN
INTRAMUSCULAR | Status: AC
  Administered 2017-01-14: 1 mg via INTRAVENOUS
  Filled 2017-01-14: qty 2

## 2017-01-14 MED ORDER — ONDANSETRON HCL 4 MG/2ML IJ SOLN: 4.0000 mg | Freq: Four times a day (QID) | INTRAMUSCULAR | Status: DC | PRN

## 2017-01-14 MED ORDER — PHENYLEPHRINE HCL 10 MG/ML IJ SOLN
INTRAMUSCULAR | Status: AC
  Filled 2017-01-14: qty 1

## 2017-01-14 MED ORDER — BUPIVACAINE-EPINEPHRINE 0.25% -1:200000 IJ SOLN
INTRAMUSCULAR | Status: DC | PRN
  Administered 2017-01-14: 11 mL

## 2017-01-14 MED ORDER — BUPIVACAINE-EPINEPHRINE (PF) 0.25% -1:200000 IJ SOLN
INTRAMUSCULAR | Status: AC
  Filled 2017-01-14: qty 30

## 2017-01-14 MED ORDER — LACTATED RINGERS IV SOLN
INTRAVENOUS | Status: DC
  Administered 2017-01-14 (×2): via INTRAVENOUS

## 2017-01-14 MED ORDER — PHENYLEPHRINE HCL 10 MG/ML IJ SOLN
INTRAVENOUS | Status: DC | PRN
  Administered 2017-01-14: 25 ug/min via INTRAVENOUS

## 2017-01-14 SURGICAL SUPPLY — 53 items
APPLIER CLIP 9.375 MED OPEN (MISCELLANEOUS) ×3
BINDER BREAST LRG (GAUZE/BANDAGES/DRESSINGS) IMPLANT
BINDER BREAST XLRG (GAUZE/BANDAGES/DRESSINGS) ×3 IMPLANT
BLADE SURG 15 STRL LF DISP TIS (BLADE) ×1 IMPLANT
BLADE SURG 15 STRL SS (BLADE) ×2
CANISTER SUCT 3000ML PPV (MISCELLANEOUS) ×3 IMPLANT
CHLORAPREP W/TINT 26ML (MISCELLANEOUS) ×3 IMPLANT
CLIP APPLIE 9.375 MED OPEN (MISCELLANEOUS) ×1 IMPLANT
CLOSURE WOUND 1/2 X4 (GAUZE/BANDAGES/DRESSINGS) ×1
CONT SPEC 4OZ CLIKSEAL STRL BL (MISCELLANEOUS) ×15 IMPLANT
COVER PROBE W GEL 5X96 (DRAPES) ×3 IMPLANT
COVER SURGICAL LIGHT HANDLE (MISCELLANEOUS) ×3 IMPLANT
DERMABOND ADVANCED (GAUZE/BANDAGES/DRESSINGS) ×2
DERMABOND ADVANCED .7 DNX12 (GAUZE/BANDAGES/DRESSINGS) ×1 IMPLANT
DEVICE DUBIN SPECIMEN MAMMOGRA (MISCELLANEOUS) ×3 IMPLANT
DRAPE CHEST BREAST 15X10 FENES (DRAPES) ×3 IMPLANT
DRAPE UTILITY XL STRL (DRAPES) ×3 IMPLANT
ELECT COATED BLADE 2.86 ST (ELECTRODE) ×3 IMPLANT
ELECT REM PT RETURN 9FT ADLT (ELECTROSURGICAL) ×3
ELECTRODE REM PT RTRN 9FT ADLT (ELECTROSURGICAL) ×1 IMPLANT
GLOVE BIO SURGEON STRL SZ7 (GLOVE) ×3 IMPLANT
GLOVE BIOGEL PI IND STRL 7.5 (GLOVE) ×1 IMPLANT
GLOVE BIOGEL PI INDICATOR 7.5 (GLOVE) ×2
GOWN STRL REUS W/ TWL LRG LVL3 (GOWN DISPOSABLE) ×2 IMPLANT
GOWN STRL REUS W/TWL LRG LVL3 (GOWN DISPOSABLE) ×4
HEMOSTAT ARISTA ABSORB 3G PWDR (MISCELLANEOUS) ×3 IMPLANT
ILLUMINATOR WAVEGUIDE N/F (MISCELLANEOUS) ×3 IMPLANT
KIT BASIN OR (CUSTOM PROCEDURE TRAY) ×3 IMPLANT
KIT MARKER MARGIN INK (KITS) ×3 IMPLANT
MARKER SKIN DUAL TIP RULER LAB (MISCELLANEOUS) ×3 IMPLANT
NDL SAFETY ECLIPSE 18X1.5 (NEEDLE) ×1 IMPLANT
NEEDLE FILTER BLUNT 18X 1/2SAF (NEEDLE) ×2
NEEDLE FILTER BLUNT 18X1 1/2 (NEEDLE) ×1 IMPLANT
NEEDLE HYPO 18GX1.5 SHARP (NEEDLE) ×2
NEEDLE HYPO 25GX1X1/2 BEV (NEEDLE) ×3 IMPLANT
NS IRRIG 1000ML POUR BTL (IV SOLUTION) ×3 IMPLANT
PACK SURGICAL SETUP 50X90 (CUSTOM PROCEDURE TRAY) ×3 IMPLANT
PENCIL BUTTON HOLSTER BLD 10FT (ELECTRODE) ×3 IMPLANT
SPONGE LAP 18X18 X RAY DECT (DISPOSABLE) ×3 IMPLANT
STRIP CLOSURE SKIN 1/2X4 (GAUZE/BANDAGES/DRESSINGS) ×2 IMPLANT
SUT MNCRL AB 4-0 PS2 18 (SUTURE) ×6 IMPLANT
SUT MON AB 5-0 PS2 18 (SUTURE) ×3 IMPLANT
SUT SILK 2 0 SH (SUTURE) ×6 IMPLANT
SUT VIC AB 2-0 SH 27 (SUTURE) ×4
SUT VIC AB 2-0 SH 27XBRD (SUTURE) ×2 IMPLANT
SUT VIC AB 3-0 SH 27 (SUTURE) ×4
SUT VIC AB 3-0 SH 27X BRD (SUTURE) ×2 IMPLANT
SYR BULB 3OZ (MISCELLANEOUS) ×3 IMPLANT
SYR CONTROL 10ML LL (SYRINGE) ×3 IMPLANT
TOWEL OR 17X26 10 PK STRL BLUE (TOWEL DISPOSABLE) ×3 IMPLANT
TUBE CONNECTING 12'X1/4 (SUCTIONS) ×1
TUBE CONNECTING 12X1/4 (SUCTIONS) ×2 IMPLANT
YANKAUER SUCT BULB TIP NO VENT (SUCTIONS) ×3 IMPLANT

## 2017-01-14 NOTE — Anesthesia Procedure Notes (Signed)
Anesthesia Regional Block: Pectoralis block   Pre-Anesthetic Checklist: ,, timeout performed, Correct Patient, Correct Site, Correct Laterality, Correct Procedure, Correct Position, site marked, Risks and benefits discussed,  Surgical consent,  Pre-op evaluation,  At surgeon's request and post-op pain management  Laterality: Left  Prep: chloraprep       Needles:  Injection technique: Single-shot  Needle Type: Echogenic Stimulator Needle          Additional Needles:   Procedures:,,,, ultrasound used (permanent image in chart),,,,  Narrative:  Start time: 01/14/2017 3:11 PM End time: 01/14/2017 3:16 PM Injection made incrementally with aspirations every 5 mL.  Performed by: Personally  Anesthesiologist: Oleta Mouse, MD  Additional Notes: H+P and labs reviewed, risks and benefits discussed with patient, procedure tolerated well without complications

## 2017-01-14 NOTE — Anesthesia Procedure Notes (Signed)
Procedure Name: LMA Insertion Date/Time: 01/14/2017 3:31 PM Performed by: Colin Benton, CRNA Pre-anesthesia Checklist: Patient identified, Emergency Drugs available, Suction available and Patient being monitored Patient Re-evaluated:Patient Re-evaluated prior to induction Oxygen Delivery Method: Circle system utilized Preoxygenation: Pre-oxygenation with 100% oxygen Induction Type: IV induction LMA: LMA inserted LMA Size: 4.0 Number of attempts: 1 Placement Confirmation: positive ETCO2 and breath sounds checked- equal and bilateral Tube secured with: Tape Dental Injury: Teeth and Oropharynx as per pre-operative assessment

## 2017-01-14 NOTE — Transfer of Care (Signed)
Immediate Anesthesia Transfer of Care Note  Patient: Danielle [name]  Procedure(s) Performed: LEFT BREAST LUMPECTOMY WITH RADIOACTIVE SEED AND SENTINEL LYMPH NODE BIOPSY (Left Breast)  Patient Location: PACU  Anesthesia Type:[place] combined with regional for post-op pain  Level of Consciousness: awake, alert , oriented and patient cooperative  Airway & Oxygen Therapy: Patient Spontanous Breathing and Patient connected to nasal cannula oxygen  Post-op Assessment: Report given to RN, Post -op Vital signs reviewed and stable and Patient moving all extremities X 4  Post vital signs: Reviewed and stable  Last Vitals:  Vitals:   01/14/17 1315 01/14/17 1319  BP:  (!) 150/87  Pulse: 81 81  Resp: (!) 21 15  Temp:    SpO2: 100% 100%    Last Pain:  Vitals:   01/14/17 1117  TempSrc:   PainSc: 0-No pain         Complications: No apparent anesthesia complications

## 2017-01-14 NOTE — Discharge Instructions (Signed)
[facility] °Office Phone Number [phone] ° °BREAST BIOPSY/ PARTIAL MASTECTOMY: POST OP INSTRUCTIONS ° °Always review your discharge instruction sheet given to you by the facility where your surgery was performed. ° °IF YOU HAVE DISABILITY OR FAMILY LEAVE FORMS, YOU MUST BRING THEM TO THE OFFICE FOR PROCESSING.  DO NOT GIVE THEM TO YOUR DOCTOR. ° °1. A prescription for pain medication may be given to you upon discharge.  Take your pain medication as prescribed, if needed.  If narcotic pain medicine is not needed, then you may take acetaminophen (Tylenol), naprosyn (Alleve) or ibuprofen (Advil) as needed. °2. Take your usually prescribed medications unless otherwise directed °3. If you need a refill on your pain medication, please contact your pharmacy.  They will contact our office to request authorization.  Prescriptions will not be filled after 5pm or on week-ends. °4. You should eat very light the first 24 hours after surgery, such as soup, crackers, pudding, etc.  Resume your normal diet the day after surgery. °5. Most patients will experience some swelling and bruising in the breast.  Ice packs and a good support bra will help.  Wear the breast binder provided or a sports bra for 72 hours day and night.  After that wear a sports bra during the day until you return to the office. Swelling and bruising can take several days to resolve.  °6. It is common to experience some constipation if taking pain medication after surgery.  Increasing fluid intake and taking a stool softener will usually help or prevent this problem from occurring.  A mild laxative (Milk of Magnesia or Miralax) should be taken according to package directions if there are no bowel movements after 48 hours. °7. Unless discharge instructions indicate otherwise, you may remove your bandages 48 hours after surgery and you may shower at that time.  You may have steri-strips (small skin tapes) in place directly over the incision.   These strips should be left on the skin for 7-10 days and will come off on their own.  If your surgeon used skin glue on the incision, you may shower in 24 hours.  The glue will flake off over the next 2-3 weeks.  Any sutures or staples will be removed at the office during your follow-up visit. °8. ACTIVITIES:  You may resume regular daily activities (gradually increasing) beginning the next day.  Wearing a good support bra or sports bra minimizes pain and swelling.  You may have sexual intercourse when it is comfortable. °a. You may drive when you no longer are taking prescription pain medication, you can comfortably wear a seatbelt, and you can safely maneuver your car and apply brakes. °b. RETURN TO WORK:  ______________________________________________________________________________________ °9. You should see your doctor in the office for a follow-up appointment approximately two weeks after your surgery.  Your doctor’s nurse will typically make your follow-up appointment when she calls you with your pathology report.  Expect your pathology report 3-4 business days after your surgery.  You may call to check if you do not hear from us after three days. °10. OTHER INSTRUCTIONS: _______________________________________________________________________________________________ _____________________________________________________________________________________________________________________________________ °_____________________________________________________________________________________________________________________________________ °_____________________________________________________________________________________________________________________________________ ° °WHEN TO CALL DR Aariona Momon: °1. Fever over 101.0 °2. Nausea and/or vomiting. °3. Extreme swelling or bruising. °4. Continued bleeding from incision. °5. Increased pain, redness, or drainage from the incision. ° °The clinic staff is available to  answer your questions during regular business hours.  Please don’t hesitate to call and ask to speak to one of the nurses for   clinical concerns.  If you have a medical emergency, go to the nearest emergency room or call 911.  A surgeon from [facility] is always on call at the hospital. ° °For further questions, please visit [email or web address] ° °

## 2017-01-14 NOTE — Progress Notes (Signed)
ANTICOAGULATION CONSULT NOTE - Initial Consult  Pharmacy Consult for Coumadin Indication: atrial fibrillation  Allergies  Allergen Reactions  . Lisinopril Cough  . Tetanus Toxoid Adsorbed Swelling    Arm swelling    Patient Measurements: Height: 5\' 7"  (170.2 cm) Weight: 223 lb 14.4 oz (101.6 kg) IBW/kg (Calculated) : 61.6  Vital Signs: Temp: 98.7 F (37.1 C) (11/16 2015) Temp Source: Oral (11/16 2015) BP: 111/83 (11/16 2015) Pulse Rate: 91 (11/16 2015)  Labs: Recent Labs    01/12/17 1549 01/14/17 1045  HGB 13.1  --   HCT 40.3  --   PLT 127*  --   LABPROT  --  13.7  INR  --  1.06  CREATININE 1.17*  --     Estimated Creatinine Clearance: 54 mL/min (A) (by C-G formula based on SCr of 1.17 mg/dL (H)).   Medical History: Past Medical History:  Diagnosis Date  . Allergy   . Anxiety   . Aortic stenosis    Status post bioprosthetic AVR  . Arthritis    DJD  . Asthma   . Chronic systolic CHF (congestive heart failure) (HCC)    EF 30% 04/2014  . COPD (chronic obstructive pulmonary disease) ([place])   . Coronary artery disease    per pt, had LHC prior to AVR in [place] that did not show any blockages; no stents/bypass  . Gastritis   . Hyperlipidemia   . Hypertension   . Hyperthyroidism   . Multiple thyroid nodules   . Osteoporosis   . Paroxysmal atrial fibrillation (HCC)   . Pelvis fracture ([place]) 08/19/2015   MULTIPLE   . Spontaneous pneumothorax 11/28/2015   left   . Stroke [facility]) 2000   rt hand weak    Medications:  Medications Prior to Admission  Medication Sig Dispense Refill Last Dose  . albuterol (PROAIR HFA) 108 (90 Base) MCG/ACT inhaler Inhale 2 puffs into the lungs every 6 (six) hours as needed for wheezing or shortness of breath.    01/13/2017 at Unknown time  . alendronate (FOSAMAX) 70 MG tablet Take 1 tablet (70 mg total) by mouth once a week. with a full glass of water on an empty stomach. (Patient taking differently: Take 70 mg every  Saturday by mouth. with a full glass of water on an empty stomach.) 13 tablet 2 01/13/2017 at Unknown time  . ALPRAZolam (XANAX) 0.25 MG tablet Take 1 tablet (0.25 mg total) by mouth 3 (three) times daily as needed for anxiety or sleep. 30 tablet 0 01/13/2017 at Unknown time  . aspirin EC 81 MG tablet Take 81 mg daily by mouth.   Past Month at Unknown time  . atorvastatin (LIPITOR) 40 MG tablet Take 1 tablet (40 mg total) by mouth at bedtime. 90 tablet 2 01/13/2017 at Unknown time  . budesonide-formoterol (SYMBICORT) 160-4.5 MCG/ACT inhaler Inhale 2 puffs into the lungs 2 (two) times daily. 1 Inhaler 0 01/13/2017 at Unknown time  . carvedilol (COREG) 25 MG tablet Take 1 tablet (25 mg total) by mouth 2 (two) times daily. 180 tablet 1 01/14/2017 at Unknown time  . Cholecalciferol (VITAMIN D3) 400 units tablet Take 1 tablet (400 Units total) by mouth daily. 30 tablet 0 01/13/2017 at Unknown time  . diclofenac sodium (VOLTAREN) 1 % GEL Apply 4 g topically 4 (four) times daily. (Patient taking differently: Apply 4 g 4 (four) times daily as needed topically (for pain.). ) 500 g 3 01/13/2017 at Unknown time  . enoxaparin (LOVENOX) 100 MG/ML injection  Inject 1 mL (100 mg total) 2 (two) times daily into the skin. 10 mL 0 01/13/2017 at Unknown time  . furosemide (LASIX) 20 MG tablet Take 1 tablet (20 mg total) by mouth daily as needed for fluid or edema. (Patient taking differently: Take 20 mg daily by mouth. ) 90 tablet 1 01/13/2017 at Unknown time  . losartan (COZAAR) 25 MG tablet TAKE 1 TABLET BY MOUTH ONCE DAILY 90 tablet 2 01/13/2017 at Unknown time  . methimazole (TAPAZOLE) 5 MG tablet Take 3 tablets (15 mg total) by mouth every evening. (Patient taking differently: Take 5-10 mg 2 (two) times daily by mouth. Take 1 tablet (5 mg) in the morning & 2 tablets (10 mg) in the evening.) 90 tablet 1 01/13/2017 at Unknown time  . ranitidine (ZANTAC) 150 MG tablet Take 1 tablet (150 mg total) by mouth 2 (two) times  daily. 180 tablet 3 01/13/2017 at Unknown time  . traMADol (ULTRAM) 50 MG tablet Take 1 tablet (50 mg total) by mouth 3 (three) times daily. (Patient taking differently: Take 50 mg 3 (three) times daily as needed by mouth (for pain (typically twice daily)). ) 90 tablet 1 01/14/2017 at Unknown time  . warfarin (COUMADIN) 3 MG tablet Take as directed by anticoagulation clinic. (Patient taking differently: Take 3 mg every evening by mouth. Take as directed by anticoagulation clinic.) 40 tablet 2 01/10/2017  . senna-docusate (SENOKOT-S) 8.6-50 MG tablet Take 2 tablets at bedtime by mouth.    More than a month at Unknown time  . valACYclovir (VALTREX) 1000 MG tablet Take 1 tablet by mouth for 5 days. (Patient not taking: Reported on 01/07/2017) 10 tablet 0 Not Taking at Unknown time    Assessment: 71 yo F with newly diagnosed stage II left breast cancer.  OR 11/16 for lumpectomy and sentinel node biopsy.  Pt on Coumadin PTA for hx of afib and was bridged with Lovenox in anticipation of procedure.  Last Coumadin dose 11/12 and started Lovenox 11/13.  Coumadin 3mg  daily PTA  Goal of Therapy:  INR 2-3 Monitor platelets by anticoagulation protocol: Yes   Plan:  Coumadin 5mg  PO x 1 tonight. Daily INR  [employer], Pharm.D., BCPS Clinical Pharmacist 01/14/2017 8:58 PM

## 2017-01-14 NOTE — Anesthesia Preprocedure Evaluation (Addendum)
Anesthesia Evaluation  Patient identified by MRN, date of birth, ID band Patient awake    Reviewed: Allergy & Precautions, NPO status , Patient's Chart, lab work & pertinent test results  History of Anesthesia Complications Negative for: history of anesthetic complications  Airway Mallampati: III  TM Distance: >3 FB Neck ROM: Full    Dental  (+) Upper Dentures   Pulmonary COPD,  COPD inhaler, former smoker,    breath sounds clear to auscultation       Cardiovascular hypertension, Pt. on medications and Pt. on home beta blockers + CABG, + Peripheral Vascular Disease and +CHF  + dysrhythmias Atrial Fibrillation + Valvular Problems/Murmurs (s/p AVR)  Rhythm:Irregular  12/31/16 ECHO: EF 35-40%, bioprosthetic aortic valve functioning well     Neuro/Psych neg Seizures Anxiety Right weakness CVA (R hand weak), Residual Symptoms    GI/Hepatic Neg liver ROS, GERD  Medicated,  Endo/Other  Morbid obesity  Renal/GU CRFRenal disease     Musculoskeletal  (+) Arthritis ,   Abdominal   Peds  Hematology  (+) Blood dyscrasia (coumadin: INR 1.06), ,   Anesthesia Other Findings EF 35%, sp AVR  Reproductive/Obstetrics                            Anesthesia Physical Anesthesia Plan  ASA: III  Anesthesia Plan: General and Regional   Post-op Pain Management:  Regional for Post-op pain   Induction: Intravenous  PONV Risk Score and Plan: 3 and Ondansetron  Airway Management Planned: LMA and Oral ETT  Additional Equipment: None  Intra-op Plan:   Post-operative Plan: Extubation in OR  Informed Consent: I have reviewed the patients History and Physical, chart, labs and discussed the procedure including the risks, benefits and alternatives for the proposed anesthesia with the patient or authorized representative who has indicated his/her understanding and acceptance.   Dental advisory given  Plan  Discussed with: CRNA and Surgeon  Anesthesia Plan Comments:         Anesthesia Quick Evaluation

## 2017-01-14 NOTE — Op Note (Signed)
Preoperative diagnosis: Leftbreast cancer, clinical stage II Postoperative diagnosis: same as above Procedure:Leftbreast seed guided lumpectomy Left deep axillary sentinel node biopsy Injection blue dye for sentinel node idenfitication Surgeon: Dr Serita Grammes CWU:[id number] Anes: general  Specimens  1.Leftbreast tissue marked with paint 2.Leftaxillary sentinel nodes with highest count283 3. Additional left breast superior, medial, lateral and inferior margins marked short superior, long lateral, double deep Complications none Drains none Sponge count correct Dispo to pacu stable  Indications: This is [id number] with a newly diagnosed clinical stage IIleftbreast cancer. We discussed options and have elected to proceed with seed guided lumpectomy and sentinel node biopsy.   Procedure: After informed consent was obtained the patient was taken to the operating room. She first was given technetium in standard periareolar fashion. She had a pectoral block. She was given antibiotics. Sequential compression devices were on her legs. She was then placed under general anesthesia with an LMA. Then she was prepped and draped in the standard sterile surgical fashion. Surgical timeout was then performed. There was little radioactivity in her left axilla initially.  I elected due to that to infiltrate a mixture of methylene blue dye saline in the periareolar location. I massaged this upon completion. I then located the seed in thelower central leftbreast.I infiltrated marcaine in the skin and then madean incision around the areola to hide the scar.I then used the neoprobe to remove the seed and the surrounding tissue with attempt to get clear margins. I marked this with paint. MM confirmed removal of seed and theclip.I did remove additional margins that I thought might be close as listed above. The deep margin is now the muscle. I placed clips in the cavity.I then obtained  hemostasis. This was marked as above.I approximated the breast tissue with 2-0 vicryl. The skin was closed with 3-0 vicryl and 5-0 monocryl. Glue and steristrips were applied. I then infiltrated marcaine in the low axilla and made an incision below the hairline.Icarried this through the axillary fascia.There was radioactivity present that was easily noted once I got in the axilla.  There appeared to be one single node.  I removed the radioactive node. There was no blue dye present.There were no enlarged nodes. The background radioactivity was minimal. I then obtained hemostasis. I closed the axillary fascia with 2-0 vicryl.The skin was closed with 3-0 vicryl and 4-0 monocryl. Glue and steristrips were applied. A binder was placed. She was extubated and transferred to PACU.

## 2017-01-14 NOTE — Interval H&P Note (Signed)
History and Physical Interval Note:  01/14/2017 3:08 PM  Danielle [name]  has presented today for surgery, with the diagnosis of LEFT BREAST CANCER  The various methods of treatment have been discussed with the patient and family. After consideration of risks, benefits and other options for treatment, the patient has consented to  Procedure(s): LEFT BREAST LUMPECTOMY WITH RADIOACTIVE SEED AND SENTINEL LYMPH NODE BIOPSY (Left) as a surgical intervention .  The patient's history has been reviewed, patient examined, no change in status, stable for surgery.  I have reviewed the patient's chart and labs.  Questions were answered to the patient's satisfaction.     Rolm Bookbinder

## 2017-01-14 NOTE — H&P (Signed)
61 yof referred by Dr [name] for newly diagnosed left breast cancer. she has history of bioprosthetic aov. she is on coumadin with afib. history of spontaneous ptx as well. she has no personal breast history of fh of breast or ovarian cancer. she had left breast pain radiating to nipple this ends up not having anything to do with mm. she underwent mm with c density breasts. she has a posterior mass that on [place] measures 2.2x1.9x1.1 cm in size. [place] of axilla is negative. 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    Past Surgical History Tawni Pummel, RN; 12/22/2016 7:39 AM) Breast Biopsy  Left. Colon Polyp Removal - Colonoscopy  Colon Polyp Removal - Open  Hip Surgery  Left. Valve Replacement   Diagnostic Studies History Tawni Pummel, RN; 12/22/2016 7:39 AM) Colonoscopy  1-5 years ago Mammogram  within last year Pap Smear  >5 years ago  Medication History Tawni Pummel, RN; 12/22/2016 7:39 AM) Medications Reconciled  Social History Tawni Pummel, RN; 12/22/2016 7:39 AM) Caffeine use  Coffee. No alcohol use  No drug use  Tobacco use  Former smoker.  Family History Tawni Pummel, RN; 12/22/2016 7:39 AM) Diabetes Mellitus  Father, Mother, Sister. Hypertension  Father, Mother. Thyroid problems  Sister.  Pregnancy / Birth History Tawni Pummel, RN; 12/22/2016 7:39 AM) Age at menarche  4 years. Age of men[age]  68-50 Gravida  0 Para  0  Other Problems Tawni Pummel, RN; 12/22/2016 7:39 AM) Arthritis  Cerebrovascular Accident  High blood pressure  Other disease, cancer, significant illness  Thyroid Disease   Review of Systems Sunday Spillers Ledford RN; 12/22/2016 7:39 AM) General Present- Weight Gain. Not Present- Appetite Loss, Chills, Fatigue, Fever, Night Sweats and Weight Loss. Skin Not Present- Change in Wart/Mole, Dryness, Hives, Jaundice, New Lesions, Non-Healing Wounds, Rash and  Ulcer. HEENT Present- Wears glasses/contact lenses. Not Present- Earache, Hearing Loss, Hoarseness, Nose Bleed, Oral Ulcers, Ringing in the Ears, Seasonal Allergies, Sinus Pain, Sore Throat, Visual Disturbances and Yellow Eyes. Respiratory Not Present- Bloody sputum, Chronic Cough, Difficulty Breathing, Snoring and Wheezing. Breast Present- Breast Mass. Not Present- Breast Pain, Nipple Discharge and Skin Changes. Cardiovascular Present- Difficulty Breathing Lying Down. Not Present- Chest Pain, Leg Cramps, Palpitations, Rapid Heart Rate, Shortness of Breath and Swelling of Extremities. Gastrointestinal Not Present- Abdominal Pain, Bloating, Bloody Stool, Change in Bowel Habits, Chronic diarrhea, Constipation, Difficulty Swallowing, Excessive gas, Gets full quickly at meals, Hemorrhoids, Indigestion, Nausea, Rectal Pain and Vomiting. Female Genitourinary Not Present- Frequency, Nocturia, Painful Urination, Pelvic Pain and Urgency. Musculoskeletal Not Present- Back Pain, Joint Pain, Joint Stiffness, Muscle Pain, Muscle Weakness and Swelling of Extremities. Neurological Present- Trouble walking. Not Present- Decreased Memory, Fainting, Headaches, Numbness, Seizures, Tingling, Tremor and Weakness. Psychiatric Not Present- Anxiety, Bipolar, Change in Sleep Pattern, Depression, Fearful and Frequent crying. Endocrine Not Present- Cold Intolerance, Excessive Hunger, Hair Changes, Heat Intolerance, Hot flashes and New Diabetes. Hematology Present- Easy Bruising. Not Present- Excessive bleeding, Gland problems, HIV and Persistent Infections.   Physical Exam Rolm Bookbinder MD; 12/22/2016 4:00 PM) General Mental Status-Alert. Orientation-Oriented X3. Head and Neck Trachea-midline. Thyroid Gland Characteristics - normal size and consistency. Eye Sclera/Conjunctiva - Bilateral-No scleral icterus. Chest and Lung Exam Chest and lung exam reveals -quiet, even and easy respiratory effort with no  use of accessory muscles and on auscultation, normal breath sounds, no adventitious sounds and normal vocal resonance. Breast Nipples-No Discharge. Breast Lump-No Palpable Breast Mass. Note: large left  loq hematoma Cardiovascular Cardiovascular examination reveals -normal heart sounds, regular rate and rhythm with no murmurs. Lymphatic Head & Neck General Head & Neck Lymphatics: Bilateral - Description - Normal. Axillary General Axillary Region: Bilateral - Description - Normal. Note: no [place] adenopathy   Assessment & Plan Rolm Bookbinder MD; 12/22/2016 4:09 PM) BREAST CANCER OF LOWER-OUTER QUADRANT OF LEFT FEMALE BREAST (C50.512) Story: Left breast seed guided lumpectomy, left axillary sentinel node biopsy We discussed the staging and pathophysiology of breast cancer. We discussed all of the different options for treatment for breast cancer including surgery, chemotherapy, radiation therapy, Herceptin, and antiestrogen therapy. We discussed a sentinel lymph node biopsy as she does not appear to having lymph node involvement right now. We discussed the performance of that with injection of radioactive tracer. We discussed that there is a chance of having a positive node with a sentinel lymph node biopsy and we will await the permanent pathology to make any other first further decisions in terms of her treatment. One of these options might be to return to the operating room to perform an axillary lymph node dissection. We discussed up to a 5% risk lifetime of chronic shoulder pain as well as lymphedema associated with a sentinel lymph node biopsy. We discussed the options for treatment of the breast cancer which included lumpectomy versus a mastectomy. We discussed the performance of the lumpectomy with radioactive seed placement. We discussed a 5-10% chance of a positive margin requiring reexcision in the operating room.We discussed radiation. We discussed the mastectomy and the  postoperative care for that as well. Mastectomy can be followed by reconstruction. The decision for lumpectomy vs mastectomy has no impact on decision for chemotherapy. Most mastectomy patients will not need radiation therapy. We discussed that there is no difference in her survival whether she undergoes lumpectomy with radiation therapy or antiestrogen therapy.There is also no real difference between her recurrence in the breast. We discussed the risks of operation including bleeding, infection, possible reoperation. She understands her further therapy will be based on what her stages at the time of her operation.

## 2017-01-15 DIAGNOSIS — K219 Gastro-esophageal reflux disease without esophagitis: Secondary | ICD-10-CM | POA: Diagnosis not present

## 2017-01-15 DIAGNOSIS — J449 Chronic obstructive pulmonary disease, unspecified: Secondary | ICD-10-CM | POA: Diagnosis not present

## 2017-01-15 DIAGNOSIS — E0590 Thyrotoxicosis, unspecified without thyrotoxic crisis or storm: Secondary | ICD-10-CM | POA: Diagnosis not present

## 2017-01-15 DIAGNOSIS — E559 Vitamin D deficiency, unspecified: Secondary | ICD-10-CM | POA: Diagnosis not present

## 2017-01-15 DIAGNOSIS — Z8673 Personal history of transient ischemic attack (TIA), and cerebral infarction without residual deficits: Secondary | ICD-10-CM | POA: Diagnosis not present

## 2017-01-15 DIAGNOSIS — I739 Peripheral vascular disease, unspecified: Secondary | ICD-10-CM | POA: Diagnosis not present

## 2017-01-15 DIAGNOSIS — D649 Anemia, unspecified: Secondary | ICD-10-CM | POA: Diagnosis not present

## 2017-01-15 DIAGNOSIS — I358 Other nonrheumatic aortic valve disorders: Secondary | ICD-10-CM | POA: Diagnosis not present

## 2017-01-15 DIAGNOSIS — I5022 Chronic systolic (congestive) heart failure: Secondary | ICD-10-CM | POA: Diagnosis not present

## 2017-01-15 DIAGNOSIS — M159 Polyosteoarthritis, unspecified: Secondary | ICD-10-CM | POA: Diagnosis not present

## 2017-01-15 DIAGNOSIS — Z7901 Long term (current) use of anticoagulants: Secondary | ICD-10-CM | POA: Diagnosis not present

## 2017-01-15 DIAGNOSIS — I4891 Unspecified atrial fibrillation: Secondary | ICD-10-CM | POA: Diagnosis not present

## 2017-01-15 DIAGNOSIS — Z87891 Personal history of nicotine dependence: Secondary | ICD-10-CM | POA: Diagnosis not present

## 2017-01-15 DIAGNOSIS — Z170 Estrogen receptor positive status [ER+]: Secondary | ICD-10-CM | POA: Diagnosis not present

## 2017-01-15 DIAGNOSIS — I2510 Atherosclerotic heart disease of native coronary artery without angina pectoris: Secondary | ICD-10-CM | POA: Diagnosis not present

## 2017-01-15 DIAGNOSIS — E785 Hyperlipidemia, unspecified: Secondary | ICD-10-CM | POA: Diagnosis not present

## 2017-01-15 DIAGNOSIS — Z86010 Personal history of colonic polyps: Secondary | ICD-10-CM | POA: Diagnosis not present

## 2017-01-15 DIAGNOSIS — I350 Nonrheumatic aortic (valve) stenosis: Secondary | ICD-10-CM | POA: Diagnosis not present

## 2017-01-15 DIAGNOSIS — C50412 Malignant neoplasm of upper-outer quadrant of left female breast: Secondary | ICD-10-CM | POA: Diagnosis not present

## 2017-01-15 DIAGNOSIS — I110 Hypertensive heart disease with heart failure: Secondary | ICD-10-CM | POA: Diagnosis not present

## 2017-01-15 DIAGNOSIS — M25569 Pain in unspecified knee: Secondary | ICD-10-CM | POA: Diagnosis not present

## 2017-01-15 DIAGNOSIS — M545 Low back pain: Secondary | ICD-10-CM | POA: Diagnosis not present

## 2017-01-15 LAB — CBC
HEMATOCRIT: 35.5 % — AB (ref 36.0–46.0)
Hemoglobin: 11.3 g/dL — ABNORMAL LOW (ref 12.0–15.0)
MCH: 30.1 pg (ref 26.0–34.0)
MCHC: 31.8 g/dL (ref 30.0–36.0)
MCV: 94.7 fL (ref 78.0–100.0)
Platelets: 108 10*3/uL — ABNORMAL LOW (ref 150–400)
RBC: 3.75 MIL/uL — ABNORMAL LOW (ref 3.87–5.11)
RDW: 13.5 % (ref 11.5–15.5)
WBC: 6.1 10*3/uL (ref 4.0–10.5)

## 2017-01-15 LAB — PROTIME-INR
INR: 1.14
PROTHROMBIN TIME: 14.5 s (ref 11.4–15.2)

## 2017-01-15 MED ORDER — WARFARIN SODIUM 5 MG PO TABS: 5.0000 mg | ORAL_TABLET | Freq: Every day | ORAL | Status: DC

## 2017-01-15 NOTE — Discharge Summary (Signed)
[facility] Discharge Summary   Patient ID: Danielle [name] MRN: [id number] DOB/AGE: 71-Jun-1947 71 y.o.  Admit date: 01/14/2017 Discharge date: 01/15/2017  Admitting Diagnosis: Left breast cancer  Discharge Diagnosis Patient Active Problem List   Diagnosis Date Noted  . Breast cancer, left ([place]) 01/14/2017  . Malignant neoplasm of upper-outer quadrant of left breast in female, estrogen receptor positive ([place]) 12/16/2016  . Chronic knee pain 12/07/2016  . Chronic bilateral low back pain without sciatica 12/07/2016  . Peripheral musculoskeletal gait disorder 12/07/2016  . Encounter for therapeutic drug monitoring 06/16/2016  . Generalized osteoarthritis of multiple sites 05/04/2016  . Chronic anticoagulation 05/04/2016  . Stroke ([place]) 04/14/2016  . Aortic valve disease 04/10/2016  . S/P AVR 04/10/2016  . NICM (nonischemic cardiomyopathy) ([place]) 04/10/2016  . Upper airway cough syndrome 03/14/2016  . Morbid obesity due to excess calories ([place]) 03/14/2016  . COPD GOLD 0  03/11/2016  . Thrombocytopenia ([place]) 12/12/2015  . Anemia 12/12/2015  . Vitamin D deficiency 12/12/2015  . Acute urinary retention 12/11/2015  . Osteoporosis 12/11/2015  . Hip fracture ([place]) 12/10/2015  . Aortic atherosclerosis ([place]) 12/10/2015  . Lung blebs ([place]) 12/03/2015  . Pelvic fracture ([place]) 08/19/2015  . Fall at home 08/19/2015  . Essential hypertension 08/19/2015  . Hyperlipidemia 08/19/2015  . Asthma 08/19/2015  . Atrial fibrillation ([place]) 08/19/2015  . Hyperthyroidism 08/19/2015  . Chronic systolic heart failure ([place])   . 3-vessel CAD   . Aortic stenosis     Consultants None Imaging: Nm Sentinel Node Inj-no Rpt (breast)  Result Date: 01/14/2017 Sulfur colloid was injected by the nuclear medicine technologist for melanoma sentinel node.   Mm Breast Surgical Specimen  Result Date: 01/14/2017 CLINICAL DATA:  Lumpectomy was performed of the left breast today  following seed localization on January 10, 2017. EXAM: SPECIMEN RADIOGRAPH OF THE LEFT BREAST COMPARISON:  Previous exam(s). FINDINGS: Status post excision of the left breast. The radioactive seed and biopsy marker clip are present, completely intact, and were marked for pathology. IMPRESSION: Specimen radiograph of the left breast. Electronically Signed   By: Curlene Dolphin M.D.   On: 01/14/2017 16:00    Procedures Dr. Donne Hazel (01/14/17) - Left lumpectomy with left axillary sentinel node biopsy  Hospital Course:  Patient is a 71 y.o. Female with left breast cancer.  Patient was admitted and underwent procedure listed above.  Tolerated procedure well and was transferred to the floor.  Diet was advanced as tolerated.  On POD#1, the patient was voiding well, tolerating diet, ambulating well, pain well controlled, vital signs stable, incisions c/d/i and felt stable for discharge home.  Patient will follow up in our office in 3 weeks and knows to call with questions or concerns.  She will call to confirm appointment date/time. She is being sent home on lovenox and bridging to coumadin. She has lovenox at home still and knows to follow up with her PCP.   Physical Exam: General:  Alert, NAD, pleasant, comfortable Chest: normal effort of breathing, lungs CTAB, RRR, midsternal incisional scar, left nipple incisions c/d/i without erythema or drainage, left breast mildly TTP, left axillary incision c/d/i without erythema or drainage and mildly TTP.  MSK: ROM in BL upper extremities grossly intact  Allergies as of 01/15/2017      Reactions   Lisinopril Cough   Tetanus Toxoid Adsorbed Swelling   Arm swelling      Medication List    STOP taking these medications   valACYclovir 1000 MG tablet Commonly known  as:  VALTREX     TAKE these medications   alendronate 70 MG tablet Commonly known as:  FOSAMAX Take 1 tablet (70 mg total) by mouth once a week. with a full glass of water on an empty  stomach. What changed:    when to take this  additional instructions   ALPRAZolam 0.25 MG tablet Commonly known as:  XANAX Take 1 tablet (0.25 mg total) by mouth 3 (three) times daily as needed for anxiety or sleep.   aspirin EC 81 MG tablet Take 81 mg daily by mouth.   atorvastatin 40 MG tablet Commonly known as:  LIPITOR Take 1 tablet (40 mg total) by mouth at bedtime.   budesonide-formoterol 160-4.5 MCG/ACT inhaler Commonly known as:  SYMBICORT Inhale 2 puffs into the lungs 2 (two) times daily.   carvedilol 25 MG tablet Commonly known as:  COREG Take 1 tablet (25 mg total) by mouth 2 (two) times daily.   diclofenac sodium 1 % Gel Commonly known as:  VOLTAREN Apply 4 g topically 4 (four) times daily. What changed:    when to take this  reasons to take this   enoxaparin 100 MG/ML injection Commonly known as:  LOVENOX Inject 1 mL (100 mg total) 2 (two) times daily into the skin.   furosemide 20 MG tablet Commonly known as:  LASIX Take 1 tablet (20 mg total) by mouth daily as needed for fluid or edema. What changed:  when to take this   losartan 25 MG tablet Commonly known as:  COZAAR TAKE 1 TABLET BY MOUTH ONCE DAILY   methimazole 5 MG tablet Commonly known as:  TAPAZOLE Take 3 tablets (15 mg total) by mouth every evening. What changed:    how much to take  when to take this  additional instructions   oxyCODONE 5 MG immediate release tablet Commonly known as:  Oxy IR/ROXICODONE Take 1 tablet (5 mg total) every 6 (six) hours as needed by mouth for moderate pain, severe pain or breakthrough pain.   PROAIR HFA 108 (90 Base) MCG/ACT inhaler Generic drug:  albuterol Inhale 2 puffs into the lungs every 6 (six) hours as needed for wheezing or shortness of breath.   ranitidine 150 MG tablet Commonly known as:  ZANTAC Take 1 tablet (150 mg total) by mouth 2 (two) times daily.   senna-docusate 8.6-50 MG tablet Commonly known as:  Senokot-S Take 2 tablets  at bedtime by mouth.   traMADol 50 MG tablet Commonly known as:  ULTRAM Take 1 tablet (50 mg total) by mouth 3 (three) times daily. What changed:    when to take this  reasons to take this   Vitamin D3 400 units tablet Take 1 tablet (400 Units total) by mouth daily.   warfarin 3 MG tablet Commonly known as:  COUMADIN Take as directed. If you are unsure how to take this medication, talk to your nurse or doctor. Original instructions:  Take as directed by anticoagulation clinic. What changed:    how much to take  how to take this  when to take this  additional instructions        Follow-up Information    Rolm Bookbinder, MD Follow up in 3 week(s).   Specialty:  General Surgery Contact information: [address]  [place] [address] [phone]        [name], [name], MD. Call.   Specialty:  Family Medicine Why:  Call and follow up in 1 week Contact information: [place]  [place] [address] [phone]           Signed: Brigid Re, [facility] Surgery 01/15/2017, 9:30 AM Pager: [phone] Consults: [phone] Mon-Fri 7:00 am-4:30 pm Sat-Sun 7:00 am-11:30 am

## 2017-01-15 NOTE — Anesthesia Postprocedure Evaluation (Signed)
Anesthesia Post Note  Patient: Danielle [name]  Procedure(s) Performed: LEFT BREAST LUMPECTOMY WITH RADIOACTIVE SEED AND SENTINEL LYMPH NODE BIOPSY (Left Breast)     Patient location during evaluation: PACU Anesthesia Type: Regional Level of consciousness: awake, awake and alert and oriented Pain management: pain level controlled Vital Signs Assessment: post-procedure vital signs reviewed and stable Respiratory status: spontaneous breathing, nonlabored ventilation and respiratory function stable Cardiovascular status: blood pressure returned to baseline Anesthetic complications: no    Last Vitals:  Vitals:   01/15/17 1016 01/15/17 1324  BP: (!) 155/92 138/82  Pulse: 93 (!) 46  Resp: 19 20  Temp: 36.9 C 37.3 C  SpO2: 97% 93%    Last Pain:  Vitals:   01/15/17 1324  TempSrc: Oral  PainSc:                  Ikechukwu Cerny COKER

## 2017-01-15 NOTE — Care Management Note (Signed)
Case Management Note  Patient Details  Name: Danielle [name] MRN: [id number] Date of Birth: [birth year]  Subjective/Objective:  71 y.o. To be discharged home with no CM needs at this time.                   Action/Plan: CM will sign off for now but will be available should additional discharge needs arise or disposition change.    Expected Discharge Date:  01/15/17               Expected Discharge Plan:     In-House Referral:     Discharge planning Services  CM Consult  Post Acute Care Choice:    Choice offered to:     DME Arranged:    DME Agency:     HH Arranged:    HH Agency:     Status of Service:  Completed, signed off  If discussed at [employer] of Stay Meetings, dates discussed:    Additional Comments:  Delrae Sawyers, RN 01/15/2017, 9:59 AM

## 2017-01-15 NOTE — Progress Notes (Addendum)
ANTICOAGULATION CONSULT NOTE - Follow Up Consult  Pharmacy Consult for Coumadin Indication: atrial fibrillation  Allergies  Allergen Reactions  . Lisinopril Cough  . Tetanus Toxoid Adsorbed Swelling    Arm swelling    Patient Measurements: Height: 5\' 7"  (170.2 cm) Weight: 223 lb 14.4 oz (101.6 kg) IBW/kg (Calculated) : 61.6  Vital Signs: Temp: 98.1 F (36.7 C) (11/17 0526) Temp Source: Oral (11/17 0526) BP: 127/70 (11/17 0526) Pulse Rate: 80 (11/17 0526)  Labs: Recent Labs    01/12/17 1549 01/14/17 1045 01/15/17 0426  HGB 13.1  --  11.3*  HCT 40.3  --  35.5*  PLT 127*  --  108*  LABPROT  --  13.7 14.5  INR  --  1.06 1.14  CREATININE 1.17*  --   --     Estimated Creatinine Clearance: 54 mL/min (A) (by C-G formula based on SCr of 1.17 mg/dL (H)).   Medical History: Past Medical History:  Diagnosis Date  . Allergy   . Anxiety   . Aortic stenosis    Status post bioprosthetic AVR  . Arthritis    DJD  . Asthma   . Chronic systolic CHF (congestive heart failure) (HCC)    EF 30% 04/2014  . COPD (chronic obstructive pulmonary disease) ([place])   . Coronary artery disease    per pt, had LHC prior to AVR in [place] that did not show any blockages; no stents/bypass  . Gastritis   . Hyperlipidemia   . Hypertension   . Hyperthyroidism   . Multiple thyroid nodules   . Osteoporosis   . Paroxysmal atrial fibrillation (HCC)   . Pelvis fracture ([place]) 08/19/2015   MULTIPLE   . Spontaneous pneumothorax 11/28/2015   left   . Stroke [facility]) 2000   rt hand weak    Medications:  Medications Prior to Admission  Medication Sig Dispense Refill Last Dose  . albuterol (PROAIR HFA) 108 (90 Base) MCG/ACT inhaler Inhale 2 puffs into the lungs every 6 (six) hours as needed for wheezing or shortness of breath.    01/13/2017 at Unknown time  . alendronate (FOSAMAX) 70 MG tablet Take 1 tablet (70 mg total) by mouth once a week. with a full glass of water on an empty stomach.  (Patient taking differently: Take 70 mg every Saturday by mouth. with a full glass of water on an empty stomach.) 13 tablet 2 01/13/2017 at Unknown time  . ALPRAZolam (XANAX) 0.25 MG tablet Take 1 tablet (0.25 mg total) by mouth 3 (three) times daily as needed for anxiety or sleep. 30 tablet 0 01/13/2017 at Unknown time  . aspirin EC 81 MG tablet Take 81 mg daily by mouth.   Past Month at Unknown time  . atorvastatin (LIPITOR) 40 MG tablet Take 1 tablet (40 mg total) by mouth at bedtime. 90 tablet 2 01/13/2017 at Unknown time  . budesonide-formoterol (SYMBICORT) 160-4.5 MCG/ACT inhaler Inhale 2 puffs into the lungs 2 (two) times daily. 1 Inhaler 0 01/13/2017 at Unknown time  . carvedilol (COREG) 25 MG tablet Take 1 tablet (25 mg total) by mouth 2 (two) times daily. 180 tablet 1 01/14/2017 at Unknown time  . Cholecalciferol (VITAMIN D3) 400 units tablet Take 1 tablet (400 Units total) by mouth daily. 30 tablet 0 01/13/2017 at Unknown time  . diclofenac sodium (VOLTAREN) 1 % GEL Apply 4 g topically 4 (four) times daily. (Patient taking differently: Apply 4 g 4 (four) times daily as needed topically (for pain.). ) 500 g 3  01/13/2017 at Unknown time  . enoxaparin (LOVENOX) 100 MG/ML injection Inject 1 mL (100 mg total) 2 (two) times daily into the skin. 10 mL 0 01/13/2017 at Unknown time  . furosemide (LASIX) 20 MG tablet Take 1 tablet (20 mg total) by mouth daily as needed for fluid or edema. (Patient taking differently: Take 20 mg daily by mouth. ) 90 tablet 1 01/13/2017 at Unknown time  . losartan (COZAAR) 25 MG tablet TAKE 1 TABLET BY MOUTH ONCE DAILY 90 tablet 2 01/13/2017 at Unknown time  . methimazole (TAPAZOLE) 5 MG tablet Take 3 tablets (15 mg total) by mouth every evening. (Patient taking differently: Take 5-10 mg 2 (two) times daily by mouth. Take 1 tablet (5 mg) in the morning & 2 tablets (10 mg) in the evening.) 90 tablet 1 01/13/2017 at Unknown time  . ranitidine (ZANTAC) 150 MG tablet Take 1  tablet (150 mg total) by mouth 2 (two) times daily. 180 tablet 3 01/13/2017 at Unknown time  . traMADol (ULTRAM) 50 MG tablet Take 1 tablet (50 mg total) by mouth 3 (three) times daily. (Patient taking differently: Take 50 mg 3 (three) times daily as needed by mouth (for pain (typically twice daily)). ) 90 tablet 1 01/14/2017 at Unknown time  . warfarin (COUMADIN) 3 MG tablet Take as directed by anticoagulation clinic. (Patient taking differently: Take 3 mg every evening by mouth. Take as directed by anticoagulation clinic.) 40 tablet 2 01/10/2017  . senna-docusate (SENOKOT-S) 8.6-50 MG tablet Take 2 tablets at bedtime by mouth.    More than a month at Unknown time  . valACYclovir (VALTREX) 1000 MG tablet Take 1 tablet by mouth for 5 days. (Patient not taking: Reported on 01/07/2017) 10 tablet 0 Not Taking at Unknown time    Assessment: 71 yo F with newly diagnosed stage II left breast cancer.  OR 11/16 for lumpectomy and sentinel node biopsy.  Pt on Coumadin PTA for hx of afib and was bridged with Lovenox in anticipation of procedure. Coumadin resumed on 11/16. INR 1.14 after 1 dose of Coumadin. First dose of Lovenox due today. On a regular diet but no intake charted. H/H trending down. Plt 108k   Coumadin 3mg  daily PTA  Drug interactions include: methimazole, asa 81 mg (both home meds)   Goal of Therapy:  INR 2-3 Monitor platelets by anticoagulation protocol: Yes   Plan:  Coumadin 5 mg daily. Will adjust according to INR trend  Lovenox 100 mg SQ (1 mg/kg) Q 12 hours to start tonight  Daily INR  Albertina Parr, PharmD., BCPS Clinical Pharmacist Pager [phone]

## 2017-01-16 ENCOUNTER — Encounter (HOSPITAL_COMMUNITY): Payer: Self-pay | Admitting: General Surgery

## 2017-01-17 ENCOUNTER — Other Ambulatory Visit: Payer: Self-pay | Admitting: Licensed Clinical Social Worker

## 2017-01-17 ENCOUNTER — Telehealth: Payer: Self-pay | Admitting: Family Medicine

## 2017-01-17 ENCOUNTER — Encounter: Payer: Self-pay | Admitting: *Deleted

## 2017-01-17 ENCOUNTER — Other Ambulatory Visit: Payer: Self-pay | Admitting: *Deleted

## 2017-01-17 NOTE — Telephone Encounter (Signed)
Copied from [place] [phone]. Topic: General - Other >> Jan 17, 2017  9:24 AM Darl Householder, RMA wrote: Reason for CRM: patient called and states she just had breast surgery on Friday 01/14/2017 pt states she needs instruction on when to continue taking novolox injections and the dosage, pt is asking for a call from Dr. [name] CME please at [phone] pt is also requesting an antibiotic for yeast infection she has developed since surgery

## 2017-01-17 NOTE — Telephone Encounter (Signed)
Please refer to telephone call 01/07/17, when she was instructed to follow with coumadin clinic 2-3 days after surgery. She was supposed to resume Coumadin after surgery and continue Lovenox until she was seen at coumadin clinic, which should've been today.  Olita Takeshita [name], MD

## 2017-01-17 NOTE — Patient Outreach (Signed)
[facility]) Care Management  01/17/2017  Danielle [name] 11-19-1945 [id number]  Assessment- CSW received return call from patient. Patient is agreeable to home visit tomorrow at 11 am. Patient will contact CSW if she needs to reschedule appointment.  Plan-CSW will complete home visit tomorrow and complete SCAT application.  [name], BSW, MSW, [email or web address] Phone: [phone] Fax: [phone]

## 2017-01-17 NOTE — Telephone Encounter (Signed)
Spoke with patient. Patient stated that she understood how to take medication and would be here tomorrow after 3 pm for Coumadin check.

## 2017-01-17 NOTE — Patient Outreach (Signed)
[facility] [facility] Care Management [place] Telephone Outreach  01/17/2017  Danielle [name] Jan 01, 1946 [id number]  Successful telephone outreach to [facility], 71 y/o female originally referred to [place] by EMMI prevent; [facility] telephonic RN CM sent referral to [place] for further evaluation/ assessment of care needs at home.  Patient has history including, but not limited to, HTN/ HLD; Atrial Fibrillation on chronic anticoagulation therapy (ACT); sCHF, previous CVA x 2; CAD with previous CABG and AVR; and (L) hip fracture in 2017.  Patient was recently diagnosed with (L) breast cancer and underwent lumpectomy on January 14, 2017.  Patient was previously active with [facility] CM in 2017; written consent verified through review of EMR (12/10/15).  HIPAA/ identity verified with patient during phone call today, and patient provides verbal consent for [place] RN CM involvement in her care.  Today, patient reports that she is "doing pretty good" after her recent lumpectomy, and she denies concerns, pain, or problems, and sounds to be in no apparent or obvious distress throughout our 40 minute phone call.  Patient further reports:  Medications: -- Has all medicationsand takes as prescribed;reports that once she was home post-lumpectomy, she had questions about current anticoagulant instructions; states that she contacted her PCP who manages her ACT medications, and staff provided instructions and clarified her ACT regimine.  Reports she will attend PCP office visit tomorrow for INR/ further ACT management.  Reports that she is currently taking both coumadin and Lovenox, as instructed by PCP office staff earlier today.  -- Verbalizes good general understanding of the purpose, dosing, and scheduling of medications.   -- self-manage medications using weekly pill planner box. -- denies issues with swallowing medications -- patient was recently  discharged from the hospital (observation status) and all medications were thoroughly reviewed with patient today during our phone call  Provider appointments: -- All upcoming provider appointments were reviewed with patient today; patient reports "waiting" to hear from surgeon for post-lumpectomy office visit, which she reports is to be "within 2-3 weeks after hospital visit."  States office will call her with results from biopsy and provide follow up instructions for possible radiation/ chemotherapy at that time.  Patient reports she will schedule follow up appointment with surgeon when she is contacted with biopsy results  Safety/ Mobility/ Falls: -- denies new/ recent falls -- assistive devices: uses walker "all the time" due to mobility issues ("unsteadiness") following (L) hip fracture in 2017; "occasionally" uses wheelchair -- general fall risks/ prevention education discussed with patient today  [place] needs: -- reports supportive family members that assist with care needs as indicated; primary caregiver is husband, who provides transportation for patient to all provider appointments, errands, etc -- confirms possible transportation needs previously stated; has spoken with [facility] CSW and has [facility] CSW home visit scheduled for tomorrow; verbalizes commitment to working with [place] on stated community resource needs  Scientist, physiological (AD) Planning:   -- patient had previously reported that she had Living will in place; today, after discussion, patient now reports that she does NOT have HCPOA nor Living will; apparently, patient mis-understood previous Advanced Directive conversation -- strongly endorses today that she desires to be "NO CODE" -- discussed with patient that we would discuss further during [place] in-home visit, and patient agrees to this, and verbalizes desire to proceed to create AD's  Self-health management of new diagnosis breast cancer (Oct 2018),  sCHF, A-Fib: -- currently wearing "chest binder" for chest  soreness related to recent lumpectomy; denies pain several times during phone call today -- believes that her current self- health management of CHF, A-Fib "under control," and is now secondary to new diagnosis of breast cancer; agreeable to further discussion around self-management of chronic diseases during [facility] RN CCM initial home visit, which was scheduled today for next week.  Patient denies further issues, concerns, or problems today.  I provided patient with my direct phone number, the main [place] office phone number, and the [facility] CM 24-hour nurse advice phone number should issues arise prior to next scheduled [place] outreach, with initial home visit next week.  Plan:  Patient will take medications as prescribed and will attend all scheduled provider appointments  Patient will actively participate with [facility] CSW for assessment of stated community resource needs  Patient will promptly notify medical providers/ care team for any new concerns, issues, or problems that arise  I will make patient's PCP aware of [place] RN CM involvement in patient's care  [place] outreach to continue with scheduled initial home visit next week   [facility] CM Care Plan Problem One     Most Recent Value  Care Plan Problem One  Patient uncertainty with plan of care around new medical diagnosis, as evidenced by patient reporting of same  Role Documenting the Problem One  Care Management Coordinator  Care Plan for Problem One  Active  [facility] Long Term Goal   Over the next 60 days, patient will be able to verbalize general plan of care around new diagnosis of breast cancer, as evidenced by patient reporting of same during [name] RN CM outreach  [facility] Long Term Goal Start Date  01/17/17  Interventions for Problem One Long Term Goal  Discussed patient's recent surgery with her,  plan for overall care as she currently understands it,  discussed  current medications and need for patient to discuss current plan for anticoagulation during PCP office visit tomorrow,  [place] RN CM program initiated  [facility] CM Short Term Goal #1   Over the next 14 days,pPatient will meet with [facility] RN CCM to discuss overall plan of care after surgery, as evidenced by successful completion of [facility] CCM RN initial home visit  [facility] CM Short Term Goal #1 Start Date  01/17/17  Interventions for Short Term Goal #1  Discussed [facility] CCM program/ services with patient, and scheduled tentative initial home visit,  provided patient with my direct phone number in case visit needs to be re-scheduled around provider appointments, which have not yet been scheduled    [facility] CM Care Plan Problem Two     Most Recent Value  Care Plan Problem Two  Need for Advanced Directives to be created, as evidenced by patient reporting of same  Role Documenting the Problem Two  Care Management [place] for Problem Two  Active  Interventions for Problem Two Long Term Goal   Thoroughly discussed with patient significance of Advanced Directives and confirmed that patient does NOT have AD's currently in place (previously reported that she did have in place),  discussed with patient that I would bring AD planning packet to [place] initial home visit for thorough discussion,  answered all of patient's questions during phone call today, and encouraged her to continue thinking about AD desires  [facility] Long Term Goal  Over the next 60 days, patient will have Advanced Directives for HCPOA and living will in place, as evidenced by patient  reporting of same  [place] Term Goal Start Date  01/17/17     I appreciate the opportunity to participate in [place] care,  Oneta Rack, RN, BSN, [employer] Coordinator [facility] Care Management  [phone]

## 2017-01-18 ENCOUNTER — Other Ambulatory Visit: Payer: Self-pay | Admitting: Licensed Clinical Social Worker

## 2017-01-18 ENCOUNTER — Telehealth: Payer: Self-pay | Admitting: *Deleted

## 2017-01-18 ENCOUNTER — Encounter: Payer: Self-pay | Admitting: *Deleted

## 2017-01-18 ENCOUNTER — Ambulatory Visit (INDEPENDENT_AMBULATORY_CARE_PROVIDER_SITE_OTHER): Payer: Medicare Other | Admitting: *Deleted

## 2017-01-18 DIAGNOSIS — I4891 Unspecified atrial fibrillation: Secondary | ICD-10-CM | POA: Diagnosis not present

## 2017-01-18 LAB — POCT INR: INR: 1.2

## 2017-01-18 NOTE — Telephone Encounter (Signed)
Received order for oncotype testing. Requisition sent to pathology 

## 2017-01-18 NOTE — Patient Outreach (Signed)
[place] [facility]) Care Management  [facility] Social Work  01/18/2017  Danielle [name] [birth year] [id number]  Encounter Medications:  Outpatient Encounter Medications as of 01/18/2017  Medication Sig Note  . albuterol (PROAIR HFA) 108 (90 Base) MCG/ACT inhaler Inhale 2 puffs into the lungs every 6 (six) hours as needed for wheezing or shortness of breath.  01/17/2017: Has not needed recently   . alendronate (FOSAMAX) 70 MG tablet Take 1 tablet (70 mg total) by mouth once a week. with a full glass of water on an empty stomach. (Patient taking differently: Take 70 mg every Saturday by mouth. with a full glass of water on an empty stomach.)   . ALPRAZolam (XANAX) 0.25 MG tablet Take 1 tablet (0.25 mg total) by mouth 3 (three) times daily as needed for anxiety or sleep.   Marland Kitchen aspirin EC 81 MG tablet Take 81 mg daily by mouth.   Marland Kitchen atorvastatin (LIPITOR) 40 MG tablet Take 1 tablet (40 mg total) by mouth at bedtime.   . budesonide-formoterol (SYMBICORT) 160-4.5 MCG/ACT inhaler Inhale 2 puffs into the lungs 2 (two) times daily.   . carvedilol (COREG) 25 MG tablet Take 1 tablet (25 mg total) by mouth 2 (two) times daily.   . Cholecalciferol (VITAMIN D3) 400 units tablet Take 1 tablet (400 Units total) by mouth daily.   . diclofenac sodium (VOLTAREN) 1 % GEL Apply 4 g topically 4 (four) times daily. (Patient taking differently: Apply 4 g 4 (four) times daily as needed topically (for pain.). )   . enoxaparin (LOVENOX) 100 MG/ML injection Inject 1 mL (100 mg total) 2 (two) times daily into the skin.   . furosemide (LASIX) 20 MG tablet Take 1 tablet (20 mg total) by mouth daily as needed for fluid or edema. (Patient taking differently: Take 20 mg daily by mouth. )   . losartan (COZAAR) 25 MG tablet TAKE 1 TABLET BY MOUTH ONCE DAILY   . methimazole (TAPAZOLE) 5 MG tablet Take 3 tablets (15 mg total) by mouth every evening. (Patient taking differently: Take 5-10 mg 2 (two) times daily by  mouth. Take 1 tablet (5 mg) in the morning & 2 tablets (10 mg) in the evening.)   . oxyCODONE (OXY IR/ROXICODONE) 5 MG immediate release tablet Take 1 tablet (5 mg total) every 6 (six) hours as needed by mouth for moderate pain, severe pain or breakthrough pain.   . ranitidine (ZANTAC) 150 MG tablet Take 1 tablet (150 mg total) by mouth 2 (two) times daily.   Marland Kitchen senna-docusate (SENOKOT-S) 8.6-50 MG tablet Take 2 tablets at bedtime by mouth.    . traMADol (ULTRAM) 50 MG tablet Take 1 tablet (50 mg total) by mouth 3 (three) times daily. (Patient taking differently: Take 50 mg 3 (three) times daily as needed by mouth (for pain (typically twice daily)). )   . warfarin (COUMADIN) 3 MG tablet Take as directed by anticoagulation clinic. (Patient taking differently: Take 3 mg every evening by mouth. Take as directed by anticoagulation clinic.)    No facility-administered encounter medications on file as of 01/18/2017.     Functional Status:  In your present state of health, do you have any difficulty performing the following activities: 01/17/2017 01/12/2017  Hearing? N N  Vision? N N  Difficulty concentrating or making decisions? N N  Walking or climbing stairs? Y Y  Dressing or bathing? N N  Doing errands, shopping? Danielle [name]  Preparing Food and eating ? N -  Using the Toilet? N -  In the past six months, have you accidently leaked urine? Y -  Comment reports wears Depends when necessary -  Do you have problems with loss of bowel control? N -  Managing your Medications? N -  Managing your Finances? N -  Housekeeping or managing your Housekeeping? Y -  Comment Family assists -  Some recent data might be hidden    Fall/Depression Screening:  PHQ 2/9 Scores 01/18/2017 01/13/2017 12/07/2016 05/20/2016 01/02/2016 12/23/2015  PHQ - 2 Score 0 0 0 0 0 0  PHQ- 9 Score - - 10 - - -    Assessment: CSW completed scheduled home visit on 01/18/17. Patient had a lot of difficulty getting to the door and was  experiencing some SOB as well. When CSW offered assistance throughout the home visit with getting paperwork, the door, etc the patient was adamant that she wished to get up and moving in order to become stronger. CSW provided positive feedback to this goal. Patient reports being in [place] since 2017. She has multiple health conditions that contribute to her transportation barriers. Patient has an unsteady gait but seems very motivated to get better. Patient reports that her spouse works full time and gets off from work daily at [employer]. However, patient would like to not be so dependent on spouse and be able to do for herself which is why she is interested in gaining [employer] transportation services. Patient could benefit from an aide in the home but patient denies interest in this at this time stating "I'm not at that point yet." CSW provided brief education on senior resources within the area. Patient reports that she gets some socialization by attending church weekly and going out to eat with her family. Patient reports that she has a strong support network that includes her spouse and nearby neighbors. CSW completed entire [employer] application and educated patient on the application process. CSW will fax completed application to [employer] and will monitor process closely.   [facility] CM Care Plan Problem One     Most Recent Value  Care Plan Problem One  Lack of stable transportation to medical appointments  Role Documenting the Problem One  Clinical Social Worker  Care Plan for Problem One  Active  [facility] Long Term Goal   Patient will gain stable transportation within 90 days per patient report. Patient has upcoming radiation appointments and has no source of transportation at this time  [place] Start Date  01/14/17  Interventions for Problem One Long Term Goal  [employer] application was successfully faxed to [place] office on 01/18/17. CSW provided transportation resource handout as well and provided education. CSW will  monitor process very closely.     Plan: CSW will route encounter to PCP. CSW successfully faxed application to [employer]. CSW will follow up with patient within 3 weeks.  [name], BSW, MSW, [email or web address] Phone: [phone] Fax: [phone]

## 2017-01-19 ENCOUNTER — Telehealth: Payer: Self-pay | Admitting: Family Medicine

## 2017-01-19 NOTE — Telephone Encounter (Signed)
Copied from [place]. Topic: Quick Communication - See Telephone Encounter >> Jan 19, 2017 10:10 AM [place] Belt A wrote: CRM for notification. See Telephone encounter for:  pt had surgery this past week on her breast, she was given a antibiotic.  It has now given her a yeast infection and would like to know if something could be called in for her?  Pt stated she called Monday.   Pharmacy -[facility] [address]    01/19/17.

## 2017-01-19 NOTE — Telephone Encounter (Signed)
[employer] called with pt on the phone, requesting something for a yeast infection, due to the abx pt was put on after her surgery last Friday, 11/16.  Requesting difucan or anti fungal rx  [place], [place] - [address] [phone] (Phone) [phone] (Fax)    Pt states she has been trying since Monday to get this Rx

## 2017-01-19 NOTE — Telephone Encounter (Signed)
Spoke with patient, let her know that per provider, she would have to be seen before medication is prescribed. Patient verbalized understanding and said she had an appointment scheduled for Tuesday.

## 2017-01-21 LAB — PROTIME-INR
INR: 1.3
Prothrombin Time: 16 seconds — ABNORMAL HIGH (ref 11.4–15.2)

## 2017-01-24 ENCOUNTER — Encounter: Payer: Self-pay | Admitting: *Deleted

## 2017-01-24 ENCOUNTER — Other Ambulatory Visit: Payer: Self-pay | Admitting: *Deleted

## 2017-01-24 NOTE — Patient Outreach (Addendum)
Danielle [name]) Care Management  [place] Initial Home Visit  01/24/2017  Danielle [name] 03/25/1945 [id number]  Danielle [name] is a 71 y.o. female originally referred to [facility] by EMMI prevent; [facility] telephonic RN CM sent referral to [facility] for further evaluation/ assessment of care needs at home.  Patient has history including, but not limited to, HTN/ HLD; Atrial Fibrillation on chronic anticoagulation therapy (ACT); sCHF, previous CVA x 2; CAD with previous CABG and AVR; and (L) hip fracture in 2017.  Patient was recently diagnosed with (L) breast cancer and underwent lumpectomy on January 14, 2017.  HIPAA/ identity verified with patient during home visit today.  Patient was previously active with [facility] CM in 2017; written consent verified with patient through review of EMR (12/10/15), and patient declines need to make changes in [facility] CM written consent.  Pleasant 75 minute home visit.  Today, patient reports that she is "doing fairly well," after her recent lumpectomy, and she denies concerns or problems, and is in no apparent or obvious distress throughout entirety of home visit today.  Subjective: "When I first got this diagnosis, I was scared to death; I am still scared, but not as much as I was.... I'm just ready to know what I should expect in the coming weeks."  Assessment:  Danielle [name] is recuperating from her recent (L) breast biopsy/ lumpectomy surgery well, and she is committed to compliance in following her recommended plan of care in attending all provider appointments and by taking medications as prescribed.  Danielle [name] is apprehensive about her overall plan of care moving forward around her new diagnosis of breast cancer.  Danielle [name] has well-established practices for self-health management of concurrent diagnosis of CHF, CAD and recent (L) hip replacement.  Danielle [name] would like to continue improving her mobility and would like to  create Advanced Directives.  Patient further reports:  Medications: -- Has all medicationsand takes as prescribed;confirms that her questions about current anticoagulant instructions post-lumpectomy were answered by her PCP; reports that she is no longer taking lovenox injections, and is maintaining on coumadin therapy alone  -- Verbalizes good general understanding of the purpose, dosing, and scheduling of all medications.   -- self-manage medications using weekly pill planner box. -- patient was recently discharged from the hospital (observation status) and all medications were thoroughly reviewed with patient in person today during home visit  Provider appointments: -- All upcoming provider appointments were reviewed with patient today -- reports will attend upcoming PCP appointment tomorrow, stating husband to transport -- patient reports "still waiting" to hear from surgeon for "results of biopsy and post-lumpectomy office visit," although she acknowledges that she has a scheduled office visit with surgeon on February 09, 2017 -- reports has "PT session" through "[facility]" scheduled for February 14, 2017; reports also has oncology nurse navigator through cancer center involved in her care  Safety/ Mobility/ Falls: -- denies new/ recent falls -- assistive devices: uses walker "all the time" due to mobility issues ("unsteadiness") following (L) hip fracture in 2017; "occasionally" uses wheelchair -- patient's gait is steady and purposeful using rolling walker with all ambulation around her home today; no obvious fall risks noted in patient's home -- general fall risks/ prevention education discussed with patient today, and EMMI educational material was provided and reviewed with patient  Social/ [employer] needs: -- reports supportive family members that assist with care needs as indicated; primary caregiver is husband, who provides transportation for patient to all  provider appointments, errands, etc -- confirms working with with [facility] CSW for possible transportation needs, as her husband continues to work; states she "might" need assistance with transportation, "depending" on what her follow up instructions are for chemotherapy/ radiation, etc   Advanced Directive (AD) Planning:   -- provided and thoroughly discussed printed EMMI educational material, AD planning packet, and "Hard Choices for loving people" booklet; discussed significance and value of AD's, for HCPOA and Living Will; encouraged patient to independently begin reviewing educational material and also to discuss with her husband when she feels ready -- strongly endorses today that she desires to be "NO CODE" -- discussed process to take in actual creation of AD's  Self-health management of new diagnosis breast cancer (Oct 2018), sCHF, A-Fib: -- no longer wearing "chest binder" for chest soreness related to recent lumpectomy; denies pain and states "has not needed." -- encouraged patient to stay in contact with/ discuss all concerns/ questions with oncology staff, and discussed role of oncology nurse navigator with patient; encouraged her to begin writing down any questions she has to take with her to oncology appointment, once scheduled post-biopsy result availability -- discussed overall practice of healthy eating/ nutrition and exercise with patient in oncology setting; encouraged patient to ask specific questions around diet and exercise with oncology providers during next office visit/ nurse navigator outreach, and she agrees to do so -- verbalizes well-established self- health management strategies for CHF, A-Fib: monitors and records daily weights; weekly blood pressures; briefly reviewed weight gain guidelines for self-health management of CHF with patient, and encouraged her to record all weight and BP values in [facility] patient recording tool, provided to patient today during home visit.    Patient denies further issues, concerns, or problems today. I provided/ confirmed that patient hasmy direct phone number, the main [name] CM office phone number, and the [facility] CM 24-hour nurse advice phone number should issues arise prior to next scheduled [place] outreach by phone in 2 weeks.  Objective:    BP 108/68   Pulse 74   Resp 18   Ht 1.702 m (_0 )   Wt 212 lb (96.2 kg)   SpO2 98%   BMI 33.20 kg/m   Review of Systems  Constitutional: Positive for malaise/fatigue. Negative for fever and weight loss.  Respiratory: Negative.  Negative for cough, shortness of breath and wheezing.        Reports baseline intermittent SOB with activity; none demonstrated during home visit today  Cardiovascular: Negative.  Negative for chest pain, palpitations and leg swelling.  Gastrointestinal: Negative.  Negative for abdominal pain and nausea.  Genitourinary: Positive for frequency and urgency. Negative for dysuria.       Diuretic therapy  Musculoskeletal: Positive for joint pain and myalgias. Negative for falls.       (L) Hip fracture in 2017; chronic (L) hip pain as a result  Skin: Negative.   Neurological: Positive for weakness. Negative for dizziness and headaches.  Endo/Heme/Allergies: Bruises/bleeds easily.       On chronic ACT; reports "lovenox is completed, only taking coumadin now;" abdomen bruised secondary to recent lovenox injections; healing  Psychiatric/Behavioral: Negative.  Negative for depression. The patient is not nervous/anxious.    Physical Exam  Constitutional: She is oriented to person, place, and time. She appears well-developed and well-nourished. No distress.    Cardiovascular: Normal rate, regular rhythm, normal heart sounds and intact distal pulses.  Respiratory: Effort normal. No respiratory distress. She has no wheezes. She has  no rales.  GI: Soft. Bowel sounds are normal.  Musculoskeletal: She exhibits no edema.  Neurological: She is alert and  oriented to person, place, and time.  Skin: Skin is warm and dry. No erythema.  Psychiatric: She has a normal mood and affect. Her behavior is normal. Judgment and thought content normal.   Encounter Medications:   Outpatient Encounter Medications as of 01/24/2017  Medication Sig Note  . albuterol (PROAIR HFA) 108 (90 Base) MCG/ACT inhaler Inhale 2 puffs into the lungs every 6 (six) hours as needed for wheezing or shortness of breath.  01/17/2017: Has not needed recently   . alendronate (FOSAMAX) 70 MG tablet Take 1 tablet (70 mg total) by mouth once a week. with a full glass of water on an empty stomach. (Patient taking differently: Take 70 mg every Saturday by mouth. with a full glass of water on an empty stomach.)   . ALPRAZolam (XANAX) 0.25 MG tablet Take 1 tablet (0.25 mg total) by mouth 3 (three) times daily as needed for anxiety or sleep.   Marland Kitchen aspirin EC 81 MG tablet Take 81 mg daily by mouth.   Marland Kitchen atorvastatin (LIPITOR) 40 MG tablet Take 1 tablet (40 mg total) by mouth at bedtime.   . budesonide-formoterol (SYMBICORT) 160-4.5 MCG/ACT inhaler Inhale 2 puffs into the lungs 2 (two) times daily.   . carvedilol (COREG) 25 MG tablet Take 1 tablet (25 mg total) by mouth 2 (two) times daily.   . Cholecalciferol (VITAMIN D3) 400 units tablet Take 1 tablet (400 Units total) by mouth daily.   . diclofenac sodium (VOLTAREN) 1 % GEL Apply 4 g topically 4 (four) times daily. (Patient taking differently: Apply 4 g 4 (four) times daily as needed topically (for pain.). )   . enoxaparin (LOVENOX) 100 MG/ML injection Inject 1 mL (100 mg total) 2 (two) times daily into the skin.   . furosemide (LASIX) 20 MG tablet Take 1 tablet (20 mg total) by mouth daily as needed for fluid or edema. (Patient taking differently: Take 20 mg daily by mouth. )   . losartan (COZAAR) 25 MG tablet TAKE 1 TABLET BY MOUTH ONCE DAILY   . methimazole (TAPAZOLE) 5 MG tablet Take 3 tablets (15 mg total) by mouth every evening. (Patient  taking differently: Take 5-10 mg 2 (two) times daily by mouth. Take 1 tablet (5 mg) in the morning & 2 tablets (10 mg) in the evening.)   . oxyCODONE (OXY IR/ROXICODONE) 5 MG immediate release tablet Take 1 tablet (5 mg total) every 6 (six) hours as needed by mouth for moderate pain, severe pain or breakthrough pain.   . ranitidine (ZANTAC) 150 MG tablet Take 1 tablet (150 mg total) by mouth 2 (two) times daily.   Marland Kitchen senna-docusate (SENOKOT-S) 8.6-50 MG tablet Take 2 tablets at bedtime by mouth.    . traMADol (ULTRAM) 50 MG tablet Take 1 tablet (50 mg total) by mouth 3 (three) times daily. (Patient taking differently: Take 50 mg 3 (three) times daily as needed by mouth (for pain (typically twice daily)). )   . warfarin (COUMADIN) 3 MG tablet Take as directed by anticoagulation clinic. (Patient taking differently: Take 3 mg every evening by mouth. Take as directed by anticoagulation clinic.)    No facility-administered encounter medications on file as of 01/24/2017.     Functional Status:   In your present state of health, do you have any difficulty performing the following activities: 01/17/2017 01/12/2017  Hearing? N N  Vision? N  N  Difficulty concentrating or making decisions? N N  Walking or climbing stairs? Y Y  Dressing or bathing? N N  Doing errands, shopping? [name]  Preparing Food and eating ? N -  Using the Toilet? N -  In the past six months, have you accidently leaked urine? Y -  Comment reports wears Depends when necessary -  Do you have problems with loss of bowel control? N -  Managing your Medications? N -  Managing your Finances? N -  Housekeeping or managing your Housekeeping? Y -  Comment Family assists -  Some recent data might be hidden   Fall/Depression Screening:    Fall Risk  01/18/2017 01/17/2017 01/13/2017  Falls in the past year? No No No  Number falls in past yr: - - -  Injury with Fall? - - -  Risk Factor Category  - - -  Risk for fall due to : - Impaired  balance/gait;Impaired mobility Impaired balance/gait  Follow up - - -   PHQ 2/9 Scores 01/18/2017 01/13/2017 12/07/2016 05/20/2016 01/02/2016 12/23/2015  PHQ - 2 Score 0 0 0 0 0 0  PHQ- 9 Score - - 10 - - -   Plan:  Patient will take medications as prescribed and will attend all scheduled provider appointments  Patient will actively participate with [facility] CSW for assessment of stated community resource needs  Patient will promptly notify medical providers/ care team for any new concerns, issues, or problems that arise  Patient will review information provided to her today around Advanced Directive planning  I will share notes from today's [place] initial home visit with patient's PCP   [place] outreach to continue with scheduled telephone outreach in 2 weeks   [facility] CM Care Plan Problem One     Most Recent Value  Care Plan Problem One  Patient uncertainty with plan of care around new medical diagnosis, as evidenced by patient reporting of same  Role Documenting the Problem One  Care Management Coordinator  Care Plan for Problem One  Active  [facility] Long Term Goal   Over the next 60 days, patient will be able to verbalize general plan of care around new diagnosis of breast cancer, as evidenced by patient reporting of same during [facility] RN CM outreach  [facility] Long Term Goal Start Date  01/17/17  Interventions for Problem One Long Term Goal  Discussed with patient her understanding of recent breast surgery diagnosis,  confirmed that patient will contact oncology surgery provider this week if she has not heard back from them by week's end,  reviewed all upcoming provider appointments with patient.   Provided and reviewed printed EMMI educational material around new diagnosis,  [facility] RN CM initial home visit completed  [facility] CM Short Term Goal #1   Over the next 14 days,pPatient will meet with [facility] RN CCM to discuss overall plan of care after surgery, as evidenced by successful  completion of [facility] CCM RN initial home visit  [facility] CM Short Term Goal #1 Start Date  01/17/17  [facility] CM Short Term Goal #1 Met Date  01/24/17    [facility] CM Care Plan Problem Two     Most Recent Value  Care Plan Problem Two  Need for Advanced Directives to be created, as evidenced by patient reporting of same  Role Documenting the Problem Two  Care Management [place] for Problem Two  Active  Interventions for Problem Two Long Term Goal  provided, reviewed, and thoroughly discussed printed EMMI educational material, AD planning packet, and "Hard Choices for Loving people" booklet with patient,  went through each page of AD planning packet with patient and answered all of her questions about completing form,  again discussed value of having AD's in place  [facility] Long Term Goal  Over the next 60 days, patient will have Advanced Directives for [place] and living will in place, as evidenced by patient reporting of same  [place] Term Goal Start Date  01/17/17  [facility] CM Short Term Goal #1   Over the next 14 days, patient will review Advanced Directive literature provided to her today, as evidenced by patient reporting of same during [facility] RN CM outreach   [facility] CM Short Term Goal #1 Start Date  01/24/17  Interventions for Short Term Goal #2   Using teachback method, discussed all aspects of AD planning with patient, and answered all of her questions around creating AD's,  discussed with patient value of making these choices early, and encouraged her to review material provided today both independently and with her spouse/ main caregiver     I appreciate the opportunity to participate in Gerry's care,  Oneta Rack, RN, BSN, [employer] Coordinator [facility] Care Management  [phone]

## 2017-01-24 NOTE — Progress Notes (Signed)
HPI:   Danielle [name] is a 71 y.o. female, who is here today to follow on recent hospitalization and chronic anticoagulation.  Recently Dx with left breast cancer, stage IB. She underwent left breast lumpectomy on 01/14/17. She has F/U appt with surgeon, Dr Donne Hazel on 02/09/17. She states that she is not sure about when radiation will start or if she is going to need to undergo surgery again.  She has Hx of atrial fib and on chronic anticoagulation, she is on Coumadin. She follows with Dr End (cardiologist).  Because high risk of thrombotic event, anticoagulation bridge with Lovenox started a few days before surgery. She started Coumadin same dose after breast surgery. She was instructed to continue Lovenox.  01/19/17 INR 1.3. She is on Coumadin 3 mg daily. She has not changed her diet, still limiting amount of green leaves.  She is upset because she was instructed to go to the hospital for lab on 01/19/17 to check  INR. has not been on Lovenox for 4-5 days. Today INR is 1.5, she does not understand why she has to continue Lovenox. Frustrated because ecchymosis on abdominal wall where she was given injections. She denies unusual headache,gum/nose bleed,blood in stool or gross hematuria.   C/O severe soreness around surgical wounds (lymph Bx site and left breast), she is on Oxycodone 5 mg qid as needed for pain. She has not noted fever or chills, erythema or drainage from wounds.  She has Hx of chronic pain, OA and back pain. She was referred to pain clinic a few months ago, appt on 12/07/16. Tramadol was continued and 4 weeks f/u was recommended, she did not keep f/u appt (01/04/17).  HTN:  She is on Carvedilol 25 mg bid and Cozaar 25 mg daily. CHF with LVEF 35-40% (12/31/16) She has not noted PND or orthopnea. No chest pain, dyspnea, or LE worsening edema.  Lab Results  Component Value Date   CREATININE 1.17 (H) 01/12/2017   BUN 14 01/12/2017   NA 137  01/12/2017   K 4.2 01/12/2017   CL 105 01/12/2017   CO2 23 01/12/2017    Vaginal pruritus. Intense vaginal pruritus, which she attributes to abx treatment given after breast surgery. She denies discharge or bleeding. Denies sex intercourse in 1-2 years. OTC treatments did not help. Denies abdominal pain, nausea, vomiting, changes in bowel habits, blood in stool or melena.    Review of Systems  Constitutional: Positive for fatigue. Negative for activity change, appetite change and fever.  HENT: Negative for mouth sores, nosebleeds, sore throat and trouble swallowing.   Eyes: Negative for redness and visual disturbance.  Respiratory: Negative for cough, shortness of breath and wheezing.   Cardiovascular: Negative for chest pain, palpitations and leg swelling.  Gastrointestinal: Negative for abdominal pain, blood in stool, nausea and vomiting.       Negative for changes in bowel habits.  Genitourinary: Negative for decreased urine volume, dysuria, hematuria, pelvic pain, vaginal bleeding and vaginal discharge.  Musculoskeletal: Positive for arthralgias, back pain and gait problem.  Skin: Negative for rash.  Neurological: Negative for syncope, weakness and headaches.  Hematological: Bruises/bleeds easily.  Psychiatric/Behavioral: Negative for confusion. The patient is nervous/anxious.       Current Outpatient Medications on File Prior to Visit  Medication Sig Dispense Refill  . albuterol (PROAIR HFA) 108 (90 Base) MCG/ACT inhaler Inhale 2 puffs into the lungs every 6 (six) hours as needed for wheezing or shortness of breath.     Marland Kitchen  alendronate (FOSAMAX) 70 MG tablet Take 1 tablet (70 mg total) by mouth once a week. with a full glass of water on an empty stomach. (Patient taking differently: Take 70 mg every Saturday by mouth. with a full glass of water on an empty stomach.) 13 tablet 2  . ALPRAZolam (XANAX) 0.25 MG tablet Take 1 tablet (0.25 mg total) by mouth 3 (three) times daily as  needed for anxiety or sleep. 30 tablet 0  . aspirin EC 81 MG tablet Take 81 mg daily by mouth.    Marland Kitchen atorvastatin (LIPITOR) 40 MG tablet Take 1 tablet (40 mg total) by mouth at bedtime. 90 tablet 2  . budesonide-formoterol (SYMBICORT) 160-4.5 MCG/ACT inhaler Inhale 2 puffs into the lungs 2 (two) times daily. 1 Inhaler 0  . carvedilol (COREG) 25 MG tablet Take 1 tablet (25 mg total) by mouth 2 (two) times daily. 180 tablet 1  . Cholecalciferol (VITAMIN D3) 400 units tablet Take 1 tablet (400 Units total) by mouth daily. 30 tablet 0  . diclofenac sodium (VOLTAREN) 1 % GEL Apply 4 g topically 4 (four) times daily. (Patient taking differently: Apply 4 g 4 (four) times daily as needed topically (for pain.). ) 500 g 3  . furosemide (LASIX) 20 MG tablet Take 1 tablet (20 mg total) by mouth daily as needed for fluid or edema. (Patient taking differently: Take 20 mg daily by mouth. ) 90 tablet 1  . losartan (COZAAR) 25 MG tablet TAKE 1 TABLET BY MOUTH ONCE DAILY 90 tablet 2  . methimazole (TAPAZOLE) 5 MG tablet Take 3 tablets (15 mg total) by mouth every evening. (Patient taking differently: Take 5-10 mg 2 (two) times daily by mouth. Take 1 tablet (5 mg) in the morning & 2 tablets (10 mg) in the evening.) 90 tablet 1  . oxyCODONE (OXY IR/ROXICODONE) 5 MG immediate release tablet Take 1 tablet (5 mg total) every 6 (six) hours as needed by mouth for moderate pain, severe pain or breakthrough pain. 12 tablet 0  . ranitidine (ZANTAC) 150 MG tablet Take 1 tablet (150 mg total) by mouth 2 (two) times daily. 180 tablet 3  . senna-docusate (SENOKOT-S) 8.6-50 MG tablet Take 2 tablets at bedtime by mouth.     . traMADol (ULTRAM) 50 MG tablet Take 1 tablet (50 mg total) by mouth 3 (three) times daily. (Patient taking differently: Take 50 mg 3 (three) times daily as needed by mouth (for pain (typically twice daily)). ) 90 tablet 1  . warfarin (COUMADIN) 3 MG tablet Take as directed by anticoagulation clinic. (Patient taking  differently: Take 3 mg every evening by mouth. Take as directed by anticoagulation clinic.) 40 tablet 2   No current facility-administered medications on file prior to visit.      Past Medical History:  Diagnosis Date  . Allergy   . Anxiety   . Aortic stenosis    Status post bioprosthetic AVR  . Arthritis    DJD  . Asthma   . Chronic systolic CHF (congestive heart failure) (HCC)    EF 30% 04/2014  . COPD (chronic obstructive pulmonary disease) ([place])   . Coronary artery disease    per pt, had LHC prior to AVR in [place] that did not show any blockages; no stents/bypass  . Gastritis   . Hyperlipidemia   . Hypertension   . Hyperthyroidism   . Multiple thyroid nodules   . Osteoporosis   . Paroxysmal atrial fibrillation (HCC)   . Pelvis fracture ([place]) 08/19/2015  MULTIPLE   . Spontaneous pneumothorax 11/28/2015   left   . Stroke [facility]) 2000   rt hand weak   Allergies  Allergen Reactions  . Lisinopril Cough  . Tetanus Toxoid Adsorbed Swelling    Arm swelling    Social History   Socioeconomic History  . Marital status: Married    Spouse name: Sherwood  . Number of children: 0  . Years of education: None  . Highest education level: None  Social Needs  . Financial resource strain: None  . Food insecurity - worry: None  . Food insecurity - inability: None  . Transportation needs - medical: None  . Transportation needs - non-medical: None  Occupational History  . Occupation: Retired in 2004  Tobacco Use  . Smoking status: Former Smoker    Packs/day: 0.25    Years: 10.00    Pack years: 2.50    Types: Cigarettes    Last attempt to quit: 03/01/2010    Years since quitting: 6.9  . Smokeless tobacco: Never Used  Substance and Sexual Activity  . Alcohol use: No  . Drug use: No  . Sexual activity: None  Other Topics Concern  . None  Social History Narrative   Lives with husband.  Ambulated independently.    Vitals:   01/25/17 1547  BP: 134/76  Pulse: 77    Resp: 12  Temp: 98 F (36.7 C)  SpO2: 96%   Body mass index is 35.77 kg/m.  Physical Exam  Nursing note and vitals reviewed. Constitutional: She is oriented to person, place, and time. She appears well-developed. No distress.  HENT:  Head: Normocephalic and atraumatic.  Mouth/Throat: Oropharynx is clear and moist and mucous membranes are normal.  Eyes: Conjunctivae are normal.  Cardiovascular: Normal rate and regular rhythm.  No murmur heard. Respiratory: Effort normal and breath sounds normal. No respiratory distress.  GI: Soft. She exhibits no mass. There is no tenderness.  Musculoskeletal: She exhibits edema (Non pitting edema LE, bilateral.).  Lymphadenopathy:    She has no cervical adenopathy.  Neurological: She is alert and oriented to person, place, and time. She has normal strength.  She is in her wheel chair.  Skin: Skin is warm. Ecchymosis (on lower abdominal wall,no tender.) noted. No erythema.  Wounds on left breast and Bx site healing well.  Tenderness upon palpation of area anterior to left axilla, linear hypertrophic healing wound. Also tenderness upon palpation around left nipple. No skin erythema or local heat.  Psychiatric: Her mood appears anxious.  Well groomed, good eye contact.    ASSESSMENT AND PLAN:   Ms. Kerissa was seen today for hospitalization follow-up.  Diagnoses and all orders for this visit:  Chronic anticoagulation  We have a long discussion about INR goal and risks of subtherapeutic INR. She is very frustrated, after long explanation she still does not understand why she needs Lovenox. I recommend resuming Lovenox 100 mg bid until INR is at goal + 1 day.  Continue Coumadin 3 mg daily. Clearly instructed about warning signs. INR in 3 days.  -     POC INR -     enoxaparin (LOVENOX) 100 MG/ML injection; Inject 1 mL (100 mg total) into the skin 2 (two) times daily for 7 doses.  Vulvovaginal candidiasis  Due to body habits and  unstable gait + back and hip pain she does not think she can get on examination table,so we decided not to perform a pelvic exam today. Denies sexual activity for over a  year,so no risk factor for acute STD's. Empiric treatment with Diflucan started. Some side effects discussed as well as risk of interaction with Coumadin. F/U as needed.  -     fluconazole (DIFLUCAN) 150 MG tablet; Take 1 tablet (150 mg total) by mouth once a week for 2 doses. -     terconazole (TERAZOL 7) 0.4 % vaginal cream; Place 1 applicator vaginally at bedtime for 10 days.  Essential hypertension  Adequately controlled. No changes in current management. DASH diet recommended.  S/P lumpectomy, left breast  Explained that it is not unusual to have pain on surgical area for a few weeks. Wounds are healing well, mildly hypertrophic.She has Hx of keloid. Keep appt with Dr Donne Hazel and oncologist.  Chronic pain disorder  She was instructed to re-schedule appt with pain clinic.    Jerelyn Trimarco G. [name], MD  [facility]. [place] office.

## 2017-01-25 ENCOUNTER — Ambulatory Visit (INDEPENDENT_AMBULATORY_CARE_PROVIDER_SITE_OTHER): Payer: Medicare Other | Admitting: Family Medicine

## 2017-01-25 ENCOUNTER — Encounter: Payer: Self-pay | Admitting: Family Medicine

## 2017-01-25 VITALS — BP 134/76 | HR 77 | Temp 98.0°F | Resp 12 | Ht 67.0 in | Wt 228.4 lb

## 2017-01-25 DIAGNOSIS — I10 Essential (primary) hypertension: Secondary | ICD-10-CM

## 2017-01-25 DIAGNOSIS — Z9889 Other specified postprocedural states: Secondary | ICD-10-CM | POA: Diagnosis not present

## 2017-01-25 DIAGNOSIS — Z7901 Long term (current) use of anticoagulants: Secondary | ICD-10-CM

## 2017-01-25 DIAGNOSIS — B3731 Acute candidiasis of vulva and vagina: Secondary | ICD-10-CM

## 2017-01-25 DIAGNOSIS — B373 Candidiasis of vulva and vagina: Secondary | ICD-10-CM

## 2017-01-25 DIAGNOSIS — G894 Chronic pain syndrome: Secondary | ICD-10-CM | POA: Diagnosis not present

## 2017-01-25 LAB — POCT INR: INR: 1.5

## 2017-01-25 MED ORDER — TERCONAZOLE 0.4 % VA CREA: 1.0000 | TOPICAL_CREAM | Freq: Every day | VAGINAL | 1 refills | Status: AC

## 2017-01-25 MED ORDER — FLUCONAZOLE 150 MG PO TABS: 150.0000 mg | ORAL_TABLET | ORAL | 0 refills | Status: AC

## 2017-01-25 MED ORDER — ENOXAPARIN SODIUM 100 MG/ML ~~LOC~~ SOLN: 100.0000 mg | Freq: Two times a day (BID) | SUBCUTANEOUS | 0 refills | Status: DC

## 2017-01-25 NOTE — Patient Instructions (Addendum)
A few things to remember from today's visit:   Chronic anticoagulation - Plan: POC INR  Vulvovaginal candidiasis - Plan: fluconazole (DIFLUCAN) 150 MG tablet, terconazole (TERAZOL 7) 0.4 % vaginal cream  Essential hypertension  INR Friday.  Diflucan  Today and can repeat in a week if needed.  Follow with surgeon as recommended.  Vaginal Yeast infection, Adult Vaginal yeast infection is a condition that causes soreness, swelling, and redness (inflammation) of the vagina. It also causes vaginal discharge. This is a common condition. Some women get this infection frequently. What are the causes? This condition is caused by a change in the normal balance of the yeast (candida) and bacteria that live in the vagina. This change causes an overgrowth of yeast, which causes the inflammation. What increases the risk? This condition is more likely to develop in:  Women who take antibiotic medicines.  Women who have diabetes.  Women who take birth control pills.  Women who are pregnant.  Women who douche often.  Women who have a weak defense (immune) system.  Women who have been taking steroid medicines for a long time.  Women who frequently wear tight clothing.  What are the signs or symptoms? Symptoms of this condition include:  White, thick vaginal discharge.  Swelling, itching, redness, and irritation of the vagina. The lips of the vagina (vulva) may be affected as well.  Pain or a burning feeling while urinating.  Pain during sex.  How is this diagnosed? This condition is diagnosed with a medical history and physical exam. This will include a pelvic exam. Your health care provider will examine a sample of your vaginal discharge under a microscope. Your health care provider may send this sample for testing to confirm the diagnosis. How is this treated? This condition is treated with medicine. Medicines may be over-the-counter or prescription. You may be told to use one or  more of the following:  Medicine that is taken orally.  Medicine that is applied as a cream.  Medicine that is inserted directly into the vagina (suppository).  Follow these instructions at home:  Take or apply over-the-counter and prescription medicines only as told by your health care provider.  Do not have sex until your health care provider has approved. Tell your sex partner that you have a yeast infection. That person should go to his or her health care provider if he or she develops symptoms.  Do not wear tight clothes, such as pantyhose or tight pants.  Avoid using tampons until your health care provider approves.  Eat more yogurt. This may help to keep your yeast infection from returning.  Try taking a sitz bath to help with discomfort. This is a warm water bath that is taken while you are sitting down. The water should only come up to your hips and should cover your buttocks. Do this 3-4 times per day or as told by your health care provider.  Do not douche.  Wear breathable, cotton underwear.  If you have diabetes, keep your blood sugar levels under control. Contact a health care provider if:  You have a fever.  Your symptoms go away and then return.  Your symptoms do not get better with treatment.  Your symptoms get worse.  You have new symptoms.  You develop blisters in or around your vagina.  You have blood coming from your vagina and it is not your menstrual period.  You develop pain in your abdomen. This information is not intended to replace advice given  to you by your health care provider. Make sure you discuss any questions you have with your health care provider. Document Released: 11/25/2004 Document Revised: 07/30/2015 Document Reviewed: 08/19/2014 Elsevier Interactive Patient Education  2018 [employer].   Please be sure medication list is accurate. If a new problem present, please set up appointment sooner than planned today.

## 2017-01-26 ENCOUNTER — Encounter: Payer: Self-pay | Admitting: Hematology

## 2017-01-26 ENCOUNTER — Other Ambulatory Visit: Payer: Self-pay | Admitting: *Deleted

## 2017-01-26 ENCOUNTER — Encounter: Payer: Self-pay | Admitting: *Deleted

## 2017-01-26 NOTE — Progress Notes (Signed)
[facility] requested medical records, faxed to [phone], confirmation received °

## 2017-01-26 NOTE — Patient Outreach (Signed)
[facility]) Care Management [place] Telephone Outreach  01/26/2017  Danielle [name] 07-14-1945 [id number]  Successful telephone outreach to Danielle [name] is a 71 y.o. female originally referred to [place] by EMMIprevent; [facility] telephonic RN CM sent referral to [place] for further evaluation/ assessment of care needs at home.Patient has history including, but not limited to, HTN/ HLD; Atrial Fibrillation on chronic anticoagulation therapy (ACT); sCHF, previous CVA x 2; CAD withpreviousCABGand AVR; and (L) hip fracture in 2017. Patient was recently diagnosed with (L) breast cancer and underwent lumpectomy on January 14, 2017.   Patient left me a voicemail message this morning, requesting call-back regarding PCP office visit yesterday, when she was re-instructed to resume Lovenox therapy; returned patient's call within one hour of receiving her voicemail; HIPAA/ identity verified with patient during phone call today.  Today, patient reports that she "is doing pretty good," but states that she does not understand why Lovenox was re-started during PCP office visit yesterday; reviewed office visit notes from yesterday's visit, and re-assured patient that PCP does want patient to resume Lovenox; explained to patient purpose of INR goal testing, and confirmed that patient has resumed Lovenox injections as ordered during yesterday's office visit with PCP; confirmed that patient is also taking Coumadin as ordered, and that she plans to attend next INR visit for re-check on Friday 01/28/17.  I explained to patient that Lovenox and Coumadin act together to reach anticoagulation goals set forth by providers, and patient verbalized that she felt better after our conversation, and will continue taking Lovenox and coumadin as instructed.  Patient stated that she has still not heard back from oncology surgeon with results, and I again encouraged her  to place a follow up call to surgeon's office if she doesn't hear back from them by the end of this week, and she verbalized understanding and agreement with this plan.  Patient denies further issues, concerns, or problems today.  I confirmed that patient has my direct phone number, the main [facility] CM office phone number, and the [facility] CM 24-hour nurse advice phone number should issues arise prior to next scheduled [place] outreach.  Plan:  Patient will take medications as prescribed and will attend all scheduled provider appointments  Patient will actively participate with [facility] CSW for assessment of stated community resource needs  Patient will promptly notify medical providers/ care team for any new concerns, issues, or problems that arise  Patient will review information provided to her today around Advanced Directive planning  [place] outreach to continue with scheduled telephone outreach next week  [facility] CM Care Plan Problem One     Most Recent Value  Care Plan Problem One  Patient uncertainty with plan of care around new medical diagnosis, as evidenced by patient reporting of same  Role Documenting the Problem One  Care Management [place] for Problem One  Active  [facility] Long Term Goal   Over the next 60 days, patient will be able to verbalize general plan of care around new diagnosis of breast cancer, as evidenced by patient reporting of same during [facility] RN CM outreach  [facility] Long Term Goal Start Date  01/17/17  Interventions for Problem One Long Term Goal  Discussed with patient PCP office visit yesterday, reviewed PCP notes with patient, encouraged patient to adhere to new medication instructions,  discussed goals of INR values and confirmed that patient is taking Lovenox and coumadin as instructed during PCP office  visit yesterday,  confirmed that patient will attend next INR appointment on Friday 01/28/17,   again encouraged patient to contact oncology surgery  provider this week if she has not heard back from them by week's end    Oneta Rack, RN, BSN, [place] [facility] Care Management  [phone]

## 2017-01-28 ENCOUNTER — Other Ambulatory Visit: Payer: Self-pay | Admitting: *Deleted

## 2017-01-28 ENCOUNTER — Ambulatory Visit: Payer: Medicare Other

## 2017-01-28 DIAGNOSIS — M810 Age-related osteoporosis without current pathological fracture: Secondary | ICD-10-CM

## 2017-01-28 DIAGNOSIS — Z7901 Long term (current) use of anticoagulants: Secondary | ICD-10-CM

## 2017-01-28 LAB — POCT INR: INR: 2.6

## 2017-01-28 MED ORDER — ALENDRONATE SODIUM 70 MG PO TABS: 70.0000 mg | ORAL_TABLET | ORAL | 1 refills | Status: DC

## 2017-01-31 ENCOUNTER — Other Ambulatory Visit: Payer: Self-pay | Admitting: Internal Medicine

## 2017-02-01 ENCOUNTER — Other Ambulatory Visit: Payer: Self-pay | Admitting: Licensed Clinical Social Worker

## 2017-02-01 NOTE — Patient Outreach (Signed)
[facility]) Care Management  02/01/2017  Josie Burleigh 05-25-1945 [id number]  Assessment- CSW completed outreach call to patient and she answered. She reports that she has not heard from [facility] yet. CSW informed her that she will check on this because sometimes it can expedite the process. Patient appreciative and agreeable to outreach in two weeks. CSW completed call to [facility] and was informed that they have her application and will be contacting her tomorrow on 02/02/17 for an in person interview.  Plan-CSW will follow up within two weeks and continue to assist with transportation.  [name], BSW, MSW, [email or web address] Phone: [phone] Fax: [phone]

## 2017-02-03 ENCOUNTER — Encounter: Payer: Self-pay | Admitting: *Deleted

## 2017-02-03 ENCOUNTER — Other Ambulatory Visit: Payer: Self-pay | Admitting: *Deleted

## 2017-02-03 NOTE — Patient Outreach (Signed)
[facility]) Care Management [place] Telephone Outreach  02/03/2017  Danielle [name] 06-14-1945 [id number]  Successful telephone outreach to Danielle [name],71 y.o.femaleoriginally referred to [facility] CM by EMMIprevent; [facility] telephonic RN CM sent referral to [place] for further evaluation/ assessment of care needs at home.Patient has history including, but not limited to, HTN/ HLD; Atrial Fibrillation on chronic anticoagulation therapy (ACT); sCHF, previous CVA x 2; CAD withpreviousCABGand AVR; and (L) hip fracture in 2017. Patient was recently diagnosed with (L) breast cancer and underwent lumpectomy on January 14, 2017. HIPAA/ identity verified with patient during phone call today.  Today, patient reports that she "is doing real good," and states that she "is not having any bad pain, outside of usual," and she denies concerns, issues or problems.  Patient sounds to be in no obvious/ apparent distress throughout entirety of today's phone call.  Patient further reports: -- Attended last week's INR appointment; no longer taking Lovenox, now taking coumadin only at 3 mg QD; reports to go back for next INR "tomorrow afternoon." Denies further changes to medications; reports has and is taking medications as prescribed; denies questions around current medication regimen.  -- Was contacted by (oncology) surgeon for post-op follow up visit; scheduled for next week, February 09, 2017; upcoming provider appointments reviewed with patient today, patient reports husband will transport to all scheduled appointments for now-- confirms [facility] CSW assisting with stated "possible" transportation needs in the future  -- breast incisions healing well; denies concerns, redness, swelling, pan; states "just a little soreness, not bad."  States removed steri-strips and has kept incisions clean and dry  -- "has not yet" reviewed/ read Advanced Directive  planning packet previously provided on 01/24/17; states that she "knows" she "really needs to do this," and reports she will try to do this "by the end of the year."  Denies further questions around AD planning; encouraged patient to review for further discussion at next [place] home visit, which we scheduled today for the end of the month.  Patient denies further issues, concerns, or problems today. I confirmed that patient hasmy direct phone number, the main [facility] CM office phone number, and the [facility] CM 24-hour nurse advice phone number should issues arise prior to next scheduled [place] outreach, with scheduled routine home visit later this month.  Plan:  Patient will take medications as prescribed and will attend all scheduled provider appointments  Patient will actively participate with [facility] CSW for assessment of stated community resource needs  Patient will promptly notify medical providers/ care team for any new concerns, issues, or problems that arise  Patient will review information previously provided to her around Advanced Directive planning  [place] outreach to continue with scheduledroutine home visit later this month  [facility] CM Care Plan Problem One     Most Recent Value  Care Plan Problem One  Patient uncertainty with plan of care around new medical diagnosis, as evidenced by patient reporting of same  Role Documenting the Problem One  Care Management [place] for Problem One  Active  [facility] Long Term Goal   Over the next 60 days, patient will be able to verbalize general plan of care around new diagnosis of breast cancer, as evidenced by patient reporting of same during [facility] RN CM outreach  [facility] Long Term Goal Start Date  01/17/17  Interventions for Problem One Long Term Goal  Discussed with patient overall clinical status,  confirmed that  she has spoken with and scheduled office visit with surgeon for post-op follow up,  discussed last PCP  office visit where patient was taken off Lovenox,  confirmed that no other changes were made to patient's medications.  Reviewed all upcoming provider appointments with patient, and confirmed that she is planning on attending scheduled appointments,  scheduled next [facility] CCM home visit with patient    [facility] CM Care Plan Problem Two     Most Recent Value  Care Plan Problem Two  Need for Advanced Directives to be created, as evidenced by patient reporting of same  Role Documenting the Problem Two  Care Management [place] for Problem Two  Active  Interventions for Problem Two Long Term Goal   Encouraged patient to review AD planning packet and book "Hard Choices for Loving People" and consider her wishes for AD's after doing so  [facility] Long Term Goal  Over the next 60 days, patient will have Advanced Directives for HCPOA and living will in place, as evidenced by patient reporting of same  [place] Term Goal Start Date  01/17/17  [facility] CM Short Term Goal #1   Over the next 21 days, patient will review Advanced Directive literature provided to her today, as evidenced by patient reporting of same during [facility] RN CM outreach  [Goal modified today]  [facility] CM Short Term Goal #1 Start Date  02/03/17 [Re-established/ modified goal today]  Interventions for Short Term Goal #2   Confirmed that patient has not yet began to review material previously provided to her on 01/24/17,  encouraged patient to review,  confirmed that thus far, patient has no further questions around [place], RN, BSN, [place] Coordinator [facility] Care Management  [phone]

## 2017-02-04 ENCOUNTER — Ambulatory Visit (INDEPENDENT_AMBULATORY_CARE_PROVIDER_SITE_OTHER): Payer: Medicare Other | Admitting: *Deleted

## 2017-02-04 DIAGNOSIS — I4891 Unspecified atrial fibrillation: Secondary | ICD-10-CM | POA: Diagnosis not present

## 2017-02-04 LAB — POCT INR: INR: 2.6

## 2017-02-07 DIAGNOSIS — Z170 Estrogen receptor positive status [ER+]: Secondary | ICD-10-CM | POA: Diagnosis not present

## 2017-02-07 DIAGNOSIS — C50412 Malignant neoplasm of upper-outer quadrant of left female breast: Secondary | ICD-10-CM | POA: Diagnosis not present

## 2017-02-08 ENCOUNTER — Telehealth: Payer: Self-pay | Admitting: *Deleted

## 2017-02-08 DIAGNOSIS — C50412 Malignant neoplasm of upper-outer quadrant of left female breast: Secondary | ICD-10-CM

## 2017-02-08 DIAGNOSIS — Z170 Estrogen receptor positive status [ER+]: Principal | ICD-10-CM

## 2017-02-08 NOTE — Telephone Encounter (Signed)
Received oncotype results of 4/3%.  Spoke with patient and she is aware.  Referral placed for Dr. Lisbeth Renshaw.

## 2017-02-09 ENCOUNTER — Other Ambulatory Visit: Payer: Self-pay | Admitting: Licensed Clinical Social Worker

## 2017-02-09 ENCOUNTER — Telehealth: Payer: Self-pay | Admitting: Family Medicine

## 2017-02-09 ENCOUNTER — Encounter (HOSPITAL_COMMUNITY): Payer: Self-pay

## 2017-02-09 NOTE — Telephone Encounter (Signed)
Patient called to get her INR results. Patient notified of result and she states she has been taking 3 mg of her medication. I told patient the office has been closed for the last couple days and I will let her provider know she has called for instructions. She will expect a call back- [phone]

## 2017-02-09 NOTE — Patient Outreach (Signed)
[facility]) Care Management  02/09/2017  Danielle [name] June 21, 1945 [id number]  Assessment- CSW completed outreach call to patient and she answered. CSW questioned if [facility] had contacted her to schedule in person assessment and she confirmed that they did. However, she did not schedule in person interview due to miscommunication. Patient did not know that [place] could pick her up for in person interview and patient was trying arrange for spouse to take her once he got off work. CSW informed patient that [facility] Lucianne Lei that pick her up for assessment appointment and take her back home. Patient questioned if the [place] would be patient with her and CSW confirmed that they will be. CSW encouraged her to not rush getting out of the house and if she needs someone to assist her then to let [facility] know when she is scheduling assessment. Patient is agreeable to contact [facility] back today to schedule in person interview.  Plan-CSW will follow up within two weeks to ensure that in person interview with [facility] was scheduled.  [name], BSW, MSW, [name] Phone: [phone] Fax: [phone]

## 2017-02-09 NOTE — Telephone Encounter (Signed)
INR was 2.6, which is at goal,so no changes in Coumadin dose. She will continue following with Coumadin clinic. Appt in 2-3 weeks.  Thanks, BJ

## 2017-02-09 NOTE — Telephone Encounter (Signed)
Spoke with patient, informed patient of lab results and instructions. Patient verbalized understanding.

## 2017-02-10 ENCOUNTER — Other Ambulatory Visit: Payer: Self-pay | Admitting: *Deleted

## 2017-02-10 ENCOUNTER — Encounter: Payer: Self-pay | Admitting: *Deleted

## 2017-02-10 NOTE — Patient Outreach (Signed)
[facility]) Care Management [place] Telephone Outreach  02/10/2017  Danielle [name] [birth year] [id number]  Successful telephone outreach toGeraldine [name],71 y.o.femaleoriginally referred to [facility] CM by EMMIprevent; [facility] telephonic RN CM sent referral to [place] for further evaluation/ assessment of care needs at home.Patient has history including, but not limited to, HTN/ HLD; Atrial Fibrillation on chronic anticoagulation therapy (ACT); sCHF, previous CVA x 2; CAD withpreviousCABGand AVR; and (L) hip fracture in 2017. Patient was recently diagnosed with (L) breast cancer and underwent lumpectomy on January 14, 2017. Patient called me this afternoon, requested call back; called patient back within minutes of her call to me.  HIPAA/ identity verified with patientduring phone call today.  Today, patient reports that she "is doing fine," and states that she called today to obtain phone number for [facility] CSW, stating that she had tried to contact her "several times" without success, stating the calls she has placed would not go through.  Stated that she has been contacted by [facility] transportation services, and wishes to speak with [facility] CSW Danielle [name] about her conversation with [facility] personnel.  Provided phone number to patient for [facility] CSW (patient had incorrect number written down), and encouraged patient to contact Jerene Pitch, which she agreed to do.  Patient denies further needs today, and she denies concerns, issues or problems.  Patient sounds to be in no obvious/ apparent distress throughout entirety of today's phone call.  Patient did report that she attended follow up oncology surgery office visit with Dr. Donne Hazel yesterday, and states she "got a great report," and was told that she "is healing very well," and "should not need any chemotherapy; only radiation," which patient is very happy about.  Patient denies further issues,  concerns, or problems today, and stated that she was going to keep today's phone call short, as she wants to contact [place] as soon as possible. I confirmed that patient hasmy direct phone number, the main [facility] CM office phone number, and the [facility] CM 24-hour nurse advice phone number should issues arise prior to next scheduled [place] outreach, with scheduled routine home visit later this month.  Plan:  Patient will take medications as prescribed and will attend all scheduled provider appointments  Patient will actively participate with [facility] CSW for assessment of stated community resource needs  Patient will promptly notify medical providers/ care team for any new concerns, issues, or problems that arise  Patient will review information previously provided to her around Advanced Directive planning  [place] outreach to continue with scheduledroutine home visit later this month  [facility] CM Care Plan Problem One      Most Recent Value  Care Plan Problem One  Patient uncertainty with plan of care around new medical diagnosis, as evidenced by patient reporting of same  Role Documenting the Problem One  Care Management [place] for Problem One  Active  [facility]N Long Term Goal   Over the next 60 days, patient will be able to verbalize general plan of care around new diagnosis of breast cancer, as evidenced by patient reporting of same during [facility] RN CM outreach  [facility] Long Term Goal Start Date  01/17/17  Interventions for Problem One Long Term Goal  Discussed with patient yesterday's office visit with surgeon for post-op follow up,  confirmed [name] home visit with patient for later this month   Oneta Rack, RN, BSN, [place] Coordinator [facility] Care Management  [phone]    

## 2017-02-11 ENCOUNTER — Other Ambulatory Visit: Payer: Self-pay | Admitting: Licensed Clinical Social Worker

## 2017-02-11 NOTE — Patient Outreach (Signed)
[place] [facility]) Care Management  02/11/2017  Danielle [name] December 13, 1945 [id number]  Assessment- CSW received incoming call from patient. Patient reports that she is unable to find [facility] number in order to schedule in person assessment. CSW provided patient with 3 different contact numbers for [facility] and informed her to contact this CSW if she has any troubles scheduling appointment.  Plan-CSW will follow up within two weeks and continue to provide social work support and assistance with gaining stable transportation.  Danielle [name], BSW, MSW, [name] Phone: [phone] Fax: [phone]

## 2017-02-14 ENCOUNTER — Ambulatory Visit: Payer: Self-pay

## 2017-02-14 ENCOUNTER — Ambulatory Visit: Payer: Medicare Other | Attending: General Surgery | Admitting: Physical Therapy

## 2017-02-14 ENCOUNTER — Encounter: Payer: Self-pay | Admitting: Physical Therapy

## 2017-02-14 DIAGNOSIS — Z170 Estrogen receptor positive status [ER+]: Secondary | ICD-10-CM | POA: Insufficient documentation

## 2017-02-14 DIAGNOSIS — R293 Abnormal posture: Secondary | ICD-10-CM | POA: Insufficient documentation

## 2017-02-14 DIAGNOSIS — M25612 Stiffness of left shoulder, not elsewhere classified: Secondary | ICD-10-CM | POA: Insufficient documentation

## 2017-02-14 DIAGNOSIS — C50412 Malignant neoplasm of upper-outer quadrant of left female breast: Secondary | ICD-10-CM

## 2017-02-14 NOTE — Therapy (Signed)
[place], [place], [address] Phone: [phone]   Fax:  [phone]  Physical Therapy Treatment  Patient Details  Name: Danielle [name] MRN: [id number] Date of Birth: March 05, 1945 Referring Provider: Dr. Rolm Bookbinder   Encounter Date: 02/14/2017  PT End of Session - 02/14/17 1555    Visit Number  6    Number of Visits  8    PT Start Time  8299    PT Stop Time  3716    PT Time Calculation (min)  43 min    Activity Tolerance  Patient tolerated treatment well    Behavior During Therapy  [facility] for tasks assessed/performed       Past Medical History:  Diagnosis Date  . Allergy   . Anxiety   . Aortic stenosis    Status post bioprosthetic AVR  . Arthritis    DJD  . Asthma   . Chronic systolic CHF (congestive heart failure) (HCC)    EF 30% 04/2014  . COPD (chronic obstructive pulmonary disease) ([place])   . Coronary artery disease    per pt, had LHC prior to AVR in [place] that did not show any blockages; no stents/bypass  . Gastritis   . Hyperlipidemia   . Hypertension   . Hyperthyroidism   . Multiple thyroid nodules   . Osteoporosis   . Paroxysmal atrial fibrillation (HCC)   . Pelvis fracture ([place]) 08/19/2015   MULTIPLE   . Spontaneous pneumothorax 11/28/2015   left   . Stroke [facility]) 2000   rt hand weak    Past Surgical History:  Procedure Laterality Date  . BREAST LUMPECTOMY WITH RADIOACTIVE SEED AND SENTINEL LYMPH NODE BIOPSY Left 01/14/2017   Procedure: LEFT BREAST LUMPECTOMY WITH RADIOACTIVE SEED AND SENTINEL LYMPH NODE BIOPSY;  Surgeon: Rolm Bookbinder, MD;  Location: [place];  Service: General;  Laterality: Left;  . CHEST TUBE INSERTION  10/2015  . INTRAMEDULLARY (IM) NAIL INTERTROCHANTERIC Left 12/10/2015   Procedure: INTRAMEDULLARY (IM) NAIL INTERTROCHANTRIC;  Surgeon: Leandrew Koyanagi, MD;  Location: [name];  Service: Orthopedics;  Laterality: Left;  . PLEURADESIS  Left 12/03/2015   Procedure: PLEURADESIS;  Surgeon: Melrose Nakayama, MD;  Location: [place];  Service: Thoracic;  Laterality: Left;  . RESECTION OF APICAL BLEB Left 12/03/2015   Procedure: BLEBECTOMY;  Surgeon: Melrose Nakayama, MD;  Location: [place];  Service: Thoracic;  Laterality: Left;  . THORACOSCOPY  12/03/2015  . VALVE REPLACEMENT    . VIDEO ASSISTED THORACOSCOPY Left 12/03/2015   Procedure: VIDEO ASSISTED THORACOSCOPY;  Surgeon: Melrose Nakayama, MD;  Location: [place];  Service: Thoracic;  Laterality: Left;    There were no vitals filed for this visit.  Subjective Assessment - 02/14/17 1522    Subjective  Patient underwent a left lumpectomy and sentinel node biopsy on 01/14/17. One node removed which was negative. Oncotype score was low so no need for chemo but will start radiation next week. She will undergo anti-estrogen therapy. She continues to need PT for balance and strength.    Pertinent History  Patient was diagnosed on 12/06/16 with left grade 2 invasive ductal carcinoma breast cancer. It measured 2.2 cm and is located in the upper outer quadrant. It is ER/PR positive and HER2 negative with a Ki67 of 30%. She has an extensive medical history including CVAs in 2000 and 2015, left hip fracture from a fall requiring surgery in 10/17, and a pelvic fracture from a fall in 2/17. She  also had cardiac bypass 2/16. Her most recent surgery was a left lumpectomy and sentinel node biopsy.    Patient Stated Goals  Stand > 2 minutes without back pain, walk to mailbox and in community, Get up from chair without UEs, reduce lymphedema risk, regain shoulder ROM    Currently in Pain?  Yes    Pain Score  8     Pain Location  Back    Pain Orientation  Right;Left    Pain Descriptors / Indicators  Aching    Pain Type  Chronic pain    Pain Onset  More than a month ago    Pain Frequency  Intermittent    Aggravating Factors   Standing    Pain Relieving Factors  Sitting, medication          [facility] PT Assessment - 02/14/17 0001      Assessment   Medical Diagnosis  s/p left lumpectomy and sentinel node biopsy    Referring Provider  Dr. Rolm Bookbinder    Onset Date/Surgical Date  01/14/17    Hand Dominance  Right    Prior Therapy  y      Precautions   Precautions  Other (comment)    Precaution Comments  recent breast surgery, fall risk      Restrictions   Weight Bearing Restrictions  No      Balance Screen   Has the patient fallen in the past 6 months  No    Has the patient had a decrease in activity level because of a fear of falling?   Yes Being referred to Neuro rehab    Is the patient reluctant to leave their home because of a fear of falling?   No      Home Environment   Living Environment  Private residence    Living Arrangements  Spouse/significant other    Available Help at Discharge  Family      Prior Function   Level of [place]  Retired    Leisure  Unable to exercise      Cognition   Overall Cognitive Status  Within Functional Limits for tasks assessed      Posture/Postural Control   Posture/Postural Control  Postural limitations    Postural Limitations  Rounded Shoulders;Forward head      ROM / Strength   AROM / PROM / Strength  AROM      AROM   Right/Left Shoulder  Left    Left Shoulder Extension  50 Degrees    Left Shoulder Flexion  152 Degrees    Left Shoulder ABduction  111 Degrees    Left Shoulder Internal Rotation  57 Degrees    Left Shoulder External Rotation  62 Degrees      Palpation   Palpation comment  Incision appear to be well healed        LYMPHEDEMA/ONCOLOGY QUESTIONNAIRE - 02/14/17 1541      Type   Cancer Type  s/p left lumpectomy and SLNB      Surgeries   Lumpectomy Date  01/14/17    Sentinel Lymph Node Biopsy Date  01/14/17    Number Lymph Nodes Removed  1      Treatment   Active Chemotherapy Treatment  No    Past Chemotherapy Treatment  No    Active Radiation Treatment   No    Past Radiation Treatment  No    Current Hormone Treatment  No    Past Hormone  Therapy  No      What other symptoms do you have   Are you Having Heaviness or Tightness  No    Are you having Pain  No    Are you having pitting edema  No    Is it Hard or Difficult finding clothes that fit  No    Do you have infections  No    Is there Decreased scar mobility  No    Stemmer Sign  No      Right Upper Extremity Lymphedema   10 cm Proximal to Olecranon Process  35.7 cm    Olecranon Process  28.2 cm    10 cm Proximal to Ulnar Styloid Process  23 cm    Just Proximal to Ulnar Styloid Process  17.9 cm    Across Hand at [employer]  19.1 cm    At [place] of 2nd Digit  6.8 cm      Left Upper Extremity Lymphedema   10 cm Proximal to Olecranon Process  37.4 cm    Olecranon Process  28.4 cm    10 cm Proximal to Ulnar Styloid Process  23.4 cm    Just Proximal to Ulnar Styloid Process  16.6 cm    Across Hand at [employer]  19.1 cm    At [place] [employer] of 2nd Digit  6.9 cm        Quick Dash - 02/14/17 0001    Open a tight or new jar  Mild difficulty    Do heavy household chores (wash walls, wash floors)  Unable    Carry a shopping bag or briefcase  Unable    Wash your back  Unable    Use a knife to cut food  Moderate difficulty    Recreational activities in which you take some force or impact through your arm, shoulder, or hand (golf, hammering, tennis)  Moderate difficulty    During the past week, to what extent has your arm, shoulder or hand problem interfered with your normal social activities with family, friends, neighbors, or groups?  Modererately    During the past week, to what extent has your arm, shoulder or hand problem limited your work or other regular daily activities  Modererately    Arm, shoulder, or hand pain.  Moderate    Tingling (pins and needles) in your arm, shoulder, or hand  Moderate    Difficulty Sleeping  Moderate difficulty    DASH Score  61.36 %        Long  Term Clinic Goals - 02/14/17 1600      CC Long Term Goal  #1   Title  Patient will be independent in initial home exercise program    Time  4    Period  Weeks    Status  Achieved      CC Long Term Goal  #2   Title  Patient will report she is able to stand >/= 5 minutes to do short kitchen tasks.    Time  4    Period  Weeks    Status  Achieved      CC Long Term Goal  #3   Title  Patient will report she is able to walk to her mailbox and back without stopping due to fatigue to improve function.    Time  4    Period  Weeks    Status  Not Met      CC Long Term Goal  #4  Title  Patient will increase left shoulder active abduction to >/= 130 degrees in supine to tolerate radiation positioning.    Time  4    Period  Weeks    Status  Not Met      CC Long Term Goal  #5   Title  Patient will be able to perform sit to stand from a standard chair without use of UEs.    Time  4    Period  Weeks    Status  Not Met         Plan - 02-21-2017 1557    Clinical Impression Statement  Patient is doing very well following her left lumpectomy and sentinel node biopsy. Her shoulder ROM and function has returned to baseline and there is no sign of lymphedema. She would benefit from continued PT focused on fall risk, balance and leg strength. She would be best served at our Neuro building as they equipment she would benefit from and the therapists treat similar conditions regularly.    Clinical Impairments Affecting Rehab Potential  Multiple comorbidities    PT Treatment/Interventions  ADLs/Self Care Home Management;Therapeutic exercise;Patient/family education    PT Next Visit Plan  Will D/C from Cancer Rehab; she has been referred to Neuro PT    Consulted and Agree with Plan of Care  Patient;Family member/caregiver    Family Member Consulted  Husband       Patient will benefit from skilled therapeutic intervention in order to improve the following deficits and impairments:  Abnormal gait,  Decreased activity tolerance, Decreased balance, Decreased mobility, Decreased strength, Postural dysfunction, Decreased knowledge of precautions, Pain, Impaired UE functional use, Decreased endurance, Decreased range of motion, Difficulty walking  Visit Diagnosis: Malignant neoplasm of upper-outer quadrant of left breast in female, estrogen receptor positive (HCC)  Stiffness of left shoulder, not elsewhere classified  Abnormal posture   G-Codes - 2017/02/21 1603    Functional Assessment Tool Used (Outpatient Only)  Clinical Judgement    Functional Limitation  Mobility: Walking and moving around    Mobility: Walking and Moving Around Goal Status [phone])  At least 40 percent but less than 60 percent impaired, limited or restricted    Mobility: Walking and Moving Around Discharge Status [phone])  At least 60 percent but less than 80 percent impaired, limited or restricted       Problem List Patient Active Problem List   Diagnosis Date Noted  . Chronic pain disorder 01/25/2017  . Breast cancer, left ([place]) 01/14/2017  . Malignant neoplasm of upper-outer quadrant of left breast in female, estrogen receptor positive ([place]) 12/16/2016  . Chronic knee pain 12/07/2016  . Chronic bilateral low back pain without sciatica 12/07/2016  . Peripheral musculoskeletal gait disorder 12/07/2016  . Encounter for therapeutic drug monitoring 06/16/2016  . Generalized osteoarthritis of multiple sites 05/04/2016  . Chronic anticoagulation 05/04/2016  . Stroke ([place]) 04/14/2016  . Aortic valve disease 04/10/2016  . S/P AVR 04/10/2016  . NICM (nonischemic cardiomyopathy) ([place]) 04/10/2016  . Upper airway cough syndrome 03/14/2016  . Morbid obesity due to excess calories ([place]) 03/14/2016  . COPD GOLD 0  03/11/2016  . Thrombocytopenia ([place]) 12/12/2015  . Anemia 12/12/2015  . Vitamin D deficiency 12/12/2015  . Acute urinary retention 12/11/2015  . Osteoporosis 12/11/2015  . Hip fracture ([place]) 12/10/2015  .  Aortic atherosclerosis ([place]) 12/10/2015  . Lung blebs ([place]) 12/03/2015  . Pelvic fracture ([place]) 08/19/2015  . Fall at home 08/19/2015  . Essential hypertension 08/19/2015  .  Hyperlipidemia 08/19/2015  . Asthma 08/19/2015  . Atrial fibrillation ([place]) 08/19/2015  . Hyperthyroidism 08/19/2015  . Chronic systolic heart failure ([place])   . 3-vessel CAD   . Aortic stenosis    PHYSICAL THERAPY DISCHARGE SUMMARY  Visits from Start of Care: 6  Current functional level related to goals / functional outcomes: See above; functionally has returned to baseline since breast cancer surgery. Goal assessment as indicated above.   Remaining deficits: Continues to have significant balance and gait deficits with high fall risk; is being referred to neuro rehab.   Education / Equipment: HEP for post op breast surgery; HEP focused on LE strength Plan: Patient agrees to discharge.  Patient goals were partially met. Patient is being discharged due to a change in medical status.  ?????  Goals related to breast cancer met; those related to balance and gait are partially met but PT was stopped for that due to having breast surgery and will now be resumed at Neuro rehab clinic.   Annia Friendly, [name] 02/14/17 4:07 PM  [place], [place], [address] Phone: [phone]   Fax:  [phone]  Name: Danielle [name] MRN: [id number] Date of Birth: 13-Dec-1945

## 2017-02-14 NOTE — Progress Notes (Signed)
Location of Breast Cancer:Left Breast Upper Outer Quadrant  Histology per Pathology Report: Diagnosis 12/13/2016: Breast, left, needle core biopsy, 3:00 o'clock, 2cmfn - INVASIVE DUCTAL CARCINOMA  Receptor Status: ER(100%+), PR (100%+), Her2-neu (neg), Ki-(30%)  Did patient present with symptoms (if so, please note symptoms) or was this found on screening mammography?: patient had left breast pain, soreness in her nipple, no discharge,    Past/Anticipated interventions by surgeon, if ESP:[id number] 01/14/2017: Dr. Rolm Bookbinder, MD 1. Breast, lumpectomy, Left - INVASIVE DUCTAL CARCINOMA, GRADE I/III, SPANNING 2.1 CM. - DUCTAL CARCINOMA IN SITU, LOW GRADE.- INVASIVE CARCINOMA IS BROADLY PRESENT AT THE INFERIOR MARGIN OF SPECIMEN 1. - DUCTAL CARCINOMA IN SITU IS FOCALLY PRESENT AT THE INFERIOR MARGIN OF SPECIMEN 1 AND BROADLYLESS THAN 0.1 CM TO THE LATERAL MARGIN OF SPECIMEN 1.- SEE ONCOLOGY TABLE BELOW. 2. Breast, excision, Additional medial margin left - DUCTAL CARCINOMA IN SITU, LOW GRADE.- DUCTAL CARCINOMA IS FOCALLY LESS THAN 0.1 CM TO THE NEW MARGIN OF SPECIMEN 2. 3. Breast, excision, Additional lateral margin left - DUCTAL CARCINOMA IN SITU, LOW GRADE.- DUCTAL CARCINOMA IN SITU IS GREATER THAN 0.2 CM TO ALL MARGINS. 4. Breast, excision, Additional superior margin left - DUCTAL CARCINOMA IN SITU, LOW GRADE.- DUCTAL CARCINOMA IN SITU IS BROADLY LESS THAN 0.1 CM TO THE NEW MARGIN OF SPECIMEN 4. 5. Lymph node, sentinel, biopsy, Left axillary - THERE IS NO EVIDENCE OF CARCINOMA IN 1 OF 1 LYMPH NODE (0/1). 6. Breast, excision, Additional inferior margin left - DUCTAL CARCINOMA IN SITU, LOW GRADE.- DUCTAL CARCINOMA IN SITU IS GREATER THAN 0.2 CM TO ALL MARGINS.  Past/Anticipated interventions by medical oncology, if any: Chemotherapy Oncotype score=4, Dr. Burr Medico, MD  Lymphedema issues, if any:  No  Pain issues, if any:  Soreness nipple left breast when touched  SAFETY ISSUES: yes,  uses a walker,   Prior radiation? NO  Pacemaker/ICD? NO  Is the patient on methotrexate? NO  Current Complaints / other details:  Seen 12/22/16  Multidisciplinary Breast Clinic ,Menarche age 19,GOPO, no HRT  ,HX:CHF,PAF,s/p bioprosthetic AVR DJD,HTN,Multiple spontaneous pneumothorax, stroke,2000,  open heart surgery, COPD, CAD;  osteoporosis,arthrits,  Left hip replacement,  Father Lung cancer, Mother MI,   Allergies:Lisinopril-cough and Tetanus toxoid  BP 135/73   Pulse 78   Temp 98.4 F (36.9 C) (Oral)   Resp 20   Ht '5\' 7"'  (1.702 m)   Wt 231 lb (104.8 kg)   SpO2 98%   BMI 36.18 kg/m   Wt Readings from Last 3 Encounters:  02/23/17 231 lb (104.8 kg)  02/23/17 231 lb (104.8 kg)  01/25/17 228 lb 6 oz (103.6 kg)     Danielle Eaton, RN 02/14/2017,1:53 PM [phone]

## 2017-02-15 ENCOUNTER — Ambulatory Visit: Payer: Medicare Other | Admitting: Family Medicine

## 2017-02-16 ENCOUNTER — Ambulatory Visit (INDEPENDENT_AMBULATORY_CARE_PROVIDER_SITE_OTHER): Payer: Medicare Other | Admitting: General Practice

## 2017-02-16 DIAGNOSIS — Z7901 Long term (current) use of anticoagulants: Secondary | ICD-10-CM | POA: Diagnosis not present

## 2017-02-16 LAB — POCT INR: INR: 1.9

## 2017-02-16 NOTE — Patient Instructions (Addendum)
Pre visit review using our clinic review tool, if applicable. No additional management support is needed unless otherwise documented below in the visit note.  Take extra 1/2 tablet today and then continue to take 1 tablet daily.  Re-check in 4 weeks. (Use only 3 mg tablets only).

## 2017-02-17 ENCOUNTER — Encounter: Payer: Self-pay | Admitting: Radiation Oncology

## 2017-02-18 ENCOUNTER — Other Ambulatory Visit: Payer: Self-pay | Admitting: Licensed Clinical Social Worker

## 2017-02-18 NOTE — Patient Outreach (Signed)
[facility]) Care Management  02/18/2017  Danielle [name] [birth year] [id number]  Assessment- CSW completed outreach call to patient to follow up on transportation needs. Patient reports that she was able to reach [facility] successfully but decided not to schedule in person interview this week because it was raining and she is not able to hold umbrella and use walker at the same time. She shares that she will be contacting [facility] next week to reschedule in person assessment. CSW will follow up within three weeks.  Plan-CSW will follow up within three weeks to ensure that stable transportation was gained successfully.  Danielle [name], BSW, MSW, [name] Phone: [phone] Fax: [phone]

## 2017-02-21 ENCOUNTER — Other Ambulatory Visit: Payer: Self-pay | Admitting: Endocrinology

## 2017-02-21 NOTE — Telephone Encounter (Signed)
Please refill x 1 Ov is due  

## 2017-02-23 ENCOUNTER — Other Ambulatory Visit: Payer: Self-pay

## 2017-02-23 ENCOUNTER — Encounter: Payer: Self-pay | Admitting: Radiation Oncology

## 2017-02-23 ENCOUNTER — Ambulatory Visit
Admission: RE | Admit: 2017-02-23 | Discharge: 2017-02-23 | Disposition: A | Discharge status: Home / Self Care | Payer: Medicare Other | Source: Ambulatory Visit | Attending: Radiation Oncology | Admitting: Radiation Oncology

## 2017-02-23 VITALS — BP 135/73 | HR 78 | Temp 98.4°F | Resp 20 | Ht 67.0 in | Wt 231.0 lb

## 2017-02-23 DIAGNOSIS — Z9889 Other specified postprocedural states: Secondary | ICD-10-CM | POA: Diagnosis not present

## 2017-02-23 DIAGNOSIS — Z170 Estrogen receptor positive status [ER+]: Secondary | ICD-10-CM | POA: Diagnosis not present

## 2017-02-23 DIAGNOSIS — C50412 Malignant neoplasm of upper-outer quadrant of left female breast: Secondary | ICD-10-CM | POA: Diagnosis not present

## 2017-02-23 DIAGNOSIS — Z87891 Personal history of nicotine dependence: Secondary | ICD-10-CM | POA: Diagnosis not present

## 2017-02-23 DIAGNOSIS — Z8673 Personal history of transient ischemic attack (TIA), and cerebral infarction without residual deficits: Secondary | ICD-10-CM | POA: Diagnosis not present

## 2017-02-23 DIAGNOSIS — Z833 Family history of diabetes mellitus: Secondary | ICD-10-CM | POA: Diagnosis not present

## 2017-02-23 DIAGNOSIS — J449 Chronic obstructive pulmonary disease, unspecified: Secondary | ICD-10-CM | POA: Diagnosis not present

## 2017-02-23 DIAGNOSIS — I5022 Chronic systolic (congestive) heart failure: Secondary | ICD-10-CM | POA: Diagnosis not present

## 2017-02-23 DIAGNOSIS — Z79899 Other long term (current) drug therapy: Secondary | ICD-10-CM | POA: Diagnosis not present

## 2017-02-23 DIAGNOSIS — Z801 Family history of malignant neoplasm of trachea, bronchus and lung: Secondary | ICD-10-CM | POA: Diagnosis not present

## 2017-02-23 DIAGNOSIS — I110 Hypertensive heart disease with heart failure: Secondary | ICD-10-CM | POA: Diagnosis not present

## 2017-02-23 DIAGNOSIS — Z510 Encounter for antineoplastic radiation therapy: Secondary | ICD-10-CM | POA: Diagnosis not present

## 2017-02-23 DIAGNOSIS — Z7982 Long term (current) use of aspirin: Secondary | ICD-10-CM | POA: Diagnosis not present

## 2017-02-23 HISTORY — DX: Malignant neoplasm of unspecified site of unspecified female breast: C50.919

## 2017-02-23 MED ORDER — METHIMAZOLE 5 MG PO TABS: 15.0000 mg | ORAL_TABLET | Freq: Every evening | ORAL | 0 refills | Status: DC

## 2017-02-23 NOTE — Progress Notes (Signed)
Radiation Oncology         [phone] ________________________________  Name: Danielle [name]        MRN: [id number]  Date of Service: 02/23/2017 DOB: 02-Feb-1946  TT:[id number], [name], MD  Rolm Bookbinder, MD     REFERRING PHYSICIAN: Rolm Bookbinder, MD   DIAGNOSIS: The encounter diagnosis was Malignant neoplasm of upper-outer quadrant of left breast in female, estrogen receptor positive ([place]).   HISTORY OF PRESENT ILLNESS: Danielle [name] is a 71 y.o. female seen in the multidisciplinary breast clinic for a new diagnosis of left breast cancer. The patient was noted to have pain in her left breast along the nipple area. She has a history of spontaneous pneumothorax of the left lung which required procedures in the past to stabilize. She was seen for diagnostic imaging and in the posterior aspect of the breast there was a mass. On ultrasound this mass measured 2.2 x 1.9 x 1.1 cm at 3:00. And the axilla was negative for adenopathy. She underwent a biopsy on  12/13/16 revealed a grade 2, invasive ductal carcinoma, that was ER/PR positive, HER2 negative.   She underwent left lumpectomy and sentinel node evaluation on 01/14/17 which revealed a 2.1 cm grade 1 invasive ductal carcinoma with low grade DCIS. Her inferior margin was involved with her invasive cancer, and her lateral margin was 1 cm from the DCIS. Re-excision of her margins there in the OR confirm clear margins by 2 mm in both the superior and inferior sites, as well as the additional superior margin being less than 1 mm from DCIS. Her deep margin is the muscle, and 0/1 nodes were involved. Her oncotype score is 4, and Dr. Burr Medico has not recommended chemotherapy. She comes today to discuss options of adjuvant radiotherapy.    PREVIOUS RADIATION THERAPY: No   PAST MEDICAL HISTORY:  Past Medical History:  Diagnosis Date  . Allergy   . Anxiety   . Aortic stenosis    Status post bioprosthetic AVR  .  Arthritis    DJD  . Asthma   . Breast cancer ([place]) 12/13/2016   Left breast  . Chronic systolic CHF (congestive heart failure) ([place])    EF 30% 04/2014  . COPD (chronic obstructive pulmonary disease) ([place])   . Coronary artery disease    per pt, had LHC prior to AVR in [place] that did not show any blockages; no stents/bypass  . Gastritis   . Hyperlipidemia   . Hypertension   . Hyperthyroidism   . Multiple thyroid nodules   . Osteoporosis   . Paroxysmal atrial fibrillation (H[facility])   . Pelvis fracture ([place]) 08/19/2015   MULTIPLE   . Spontaneous pneumothorax 11/28/2015   left   . Stroke ([place]) 2000   rt hand weak       PAST SURGICAL HISTORY: Past Surgical History:  Procedure Laterality Date  . BREAST LUMPECTOMY WITH RADIOACTIVE SEED AND SENTINEL LYMPH NODE BIOPSY Left 01/14/2017   Procedure: LEFT BREAST LUMPECTOMY WITH RADIOACTIVE SEED AND SENTINEL LYMPH NODE BIOPSY;  Surgeon: Rolm Bookbinder, MD;  Location: [facility];  Service: General;  Laterality: Left;  . CHEST TUBE INSERTION  10/2015  . INTRAMEDULLARY (IM) NAIL INTERTROCHANTERIC Left 12/10/2015   Procedure: INTRAMEDULLARY (IM) NAIL INTERTROCHANTRIC;  Surgeon: Leandrew Koyanagi, MD;  Location: [facility];  Service: Orthopedics;  Laterality: Left;  . PLEURADESIS Left 12/03/2015   Procedure: PLEURADESIS;  Surgeon: Melrose Nakayama, MD;  Location: [facility];  Service: Thoracic;  Laterality: Left;  . RESECTION  OF APICAL BLEB Left 12/03/2015   Procedure: BLEBECTOMY;  Surgeon: Melrose Nakayama, MD;  Location: [facility];  Service: Thoracic;  Laterality: Left;  . THORACOSCOPY  12/03/2015  . VALVE REPLACEMENT    . VIDEO ASSISTED THORACOSCOPY Left 12/03/2015   Procedure: VIDEO ASSISTED THORACOSCOPY;  Surgeon: Melrose Nakayama, MD;  Location: [facility];  Service: Thoracic;  Laterality: Left;     FAMILY HISTORY:  Family History  Problem Relation Age of Onset  . Diabetes Mother   . Heart attack Mother 67  . Diabetes Father   . Lung cancer  Father   . Diabetes Sister   . Thyroid disease Sister   . Diabetes Sister   . HIV Brother      SOCIAL HISTORY:  reports that she quit smoking about 6 years ago. Her smoking use included cigarettes. She has a 2.50 pack-year smoking history. she has never used smokeless tobacco. She reports that she does not drink alcohol or use drugs. The patient is married and lives in [place]. 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  ALLERGIES: Lisinopril and Tetanus toxoid adsorbed   MEDICATIONS:  Current Outpatient Medications  Medication Sig Dispense Refill  . albuterol (PROAIR HFA) 108 (90 Base) MCG/ACT inhaler Inhale 2 puffs into the lungs every 6 (six) hours as needed for wheezing or shortness of breath.     Marland Kitchen alendronate (FOSAMAX) 70 MG tablet Take 1 tablet (70 mg total) by mouth every Saturday. with a full glass of water on an empty stomach. 13 tablet 1  . ALPRAZolam (XANAX) 0.25 MG tablet Take 1 tablet (0.25 mg total) by mouth 3 (three) times daily as needed for anxiety or sleep. 30 tablet 0  . aspirin EC 81 MG tablet Take 81 mg daily by mouth.    Marland Kitchen atorvastatin (LIPITOR) 40 MG tablet Take 1 tablet (40 mg total) by mouth at bedtime. 90 tablet 2  . budesonide-formoterol (SYMBICORT) 160-4.5 MCG/ACT inhaler Inhale 2 puffs into the lungs 2 (two) times daily. 1 Inhaler 0  . carvedilol (COREG) 25 MG tablet TAKE 1 TABLET BY MOUTH TWICE DAILY 180 tablet 2  . Cholecalciferol (VITAMIN D3) 400 units tablet Take 1 tablet (400 Units total) by mouth daily. 30 tablet 0  . diclofenac sodium (VOLTAREN) 1 % GEL Apply 4 g topically 4 (four) times daily. (Patient taking differently: Apply 4 g 4 (four) times daily as needed topically (for pain.). ) 500 g 3  . enoxaparin (LOVENOX) 100 MG/ML injection Inject 1 mL (100 mg total) into the skin 2 (two) times daily for 7 doses. 7 mL 0  . furosemide (LASIX) 20 MG tablet Take 1 tablet (20 mg total) by mouth daily as needed for  fluid or edema. (Patient taking differently: Take 20 mg daily by mouth. ) 90 tablet 1  . losartan (COZAAR) 25 MG tablet TAKE 1 TABLET BY MOUTH ONCE DAILY 90 tablet 2  . methimazole (TAPAZOLE) 5 MG tablet Take 3 tablets (15 mg total) by mouth every evening. (Patient taking differently: Take 5-10 mg 2 (two) times daily by mouth. Take 1 tablet (5 mg) in the morning & 2 tablets (10 mg) in the evening.) 90 tablet 1  . oxyCODONE (OXY IR/ROXICODONE) 5 MG immediate release tablet Take 1 tablet (5 mg total) every 6 (six) hours as needed by mouth for moderate pain, severe pain or breakthrough pain. 12 tablet 0  . ranitidine (ZANTAC) 150 MG tablet Take 1 tablet (150  mg total) by mouth 2 (two) times daily. 180 tablet 3  . senna-docusate (SENOKOT-S) 8.6-50 MG tablet Take 2 tablets at bedtime by mouth.     . traMADol (ULTRAM) 50 MG tablet Take 1 tablet (50 mg total) by mouth 3 (three) times daily. (Patient taking differently: Take 50 mg 3 (three) times daily as needed by mouth (for pain (typically twice daily)). ) 90 tablet 1  . warfarin (COUMADIN) 3 MG tablet Take as directed by anticoagulation clinic. (Patient taking differently: Take 3 mg every evening by mouth. Take as directed by anticoagulation clinic.) 40 tablet 2   No current facility-administered medications for this encounter.      REVIEW OF SYSTEMS: On review of systems, the patient reports that she is doing well overall. She denies any chest pain, shortness of breath, cough, fevers, chills, night sweats, unintended weight changes. She denies any bowel or bladder disturbances, and denies abdominal pain, nausea or vomiting. She denies any new musculoskeletal or joint aches or pains. A complete review of systems is obtained and is otherwise negative.     PHYSICAL EXAM:  Wt Readings from Last 3 Encounters:  01/25/17 228 lb 6 oz (103.6 kg)  01/24/17 212 lb (96.2 kg)  01/14/17 223 lb 14.4 oz (101.6 kg)   Temp Readings from Last 3 Encounters:    01/25/17 98 F (36.7 C)  01/15/17 99.1 F (37.3 C) (Oral)  01/12/17 98.1 F (36.7 C)   BP Readings from Last 3 Encounters:  01/25/17 134/76  01/24/17 108/68  01/15/17 138/82   Pulse Readings from Last 3 Encounters:  01/25/17 77  01/24/17 74  01/15/17 (!) 46     In general this is a well appearing African American female in no acute distress. She is alert and oriented x4 and appropriate throughout the examination. HEENT reveals that the patient is normocephalic, atraumatic. EOMs are intact. PERRLA. Skin is intact without any evidence of gross lesions. Cardiopulmonary assessment is negative for acute distress and she exhibits normal effort. The left breast is examined and reveals well healed lumpectomy and axillary incision sites without erythema or fullness.    ECOG = 0  0 - Asymptomatic (Fully active, able to carry on all predisease activities without restriction)  1 - Symptomatic but completely ambulatory (Restricted in physically strenuous activity but ambulatory and able to carry out work of a light or sedentary nature. For example, light housework, office work)  2 - Symptomatic, <50% in bed during the day (Ambulatory and capable of all self care but unable to carry out any work activities. Up and about more than 50% of waking hours)  3 - Symptomatic, >50% in bed, but not bedbound (Capable of only limited self-care, confined to bed or chair 50% or more of waking hours)  4 - Bedbound (Completely disabled. Cannot carry on any self-care. Totally confined to bed or chair)  5 - Death   Eustace Pen MM, Creech RH, Tormey DC, et al. [id number]). "Toxicity and response criteria of the [facility]". [place] Oncol. 5 (6): 649-55    LABORATORY DATA:  Lab Results  Component Value Date   WBC 6.1 01/15/2017   HGB 11.3 (L) 01/15/2017   HCT 35.5 (L) 01/15/2017   MCV 94.7 01/15/2017   PLT 108 (L) 01/15/2017   Lab Results  Component Value Date   NA 137 01/12/2017    K 4.2 01/12/2017   CL 105 01/12/2017   CO2 23 01/12/2017   Lab Results  Component Value  Date   ALT 9 12/22/2016   AST 13 12/22/2016   ALKPHOS 74 12/22/2016   BILITOT 0.70 12/22/2016      RADIOGRAPHY: No results found.     IMPRESSION/PLAN: 1. 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 Dr. Lisbeth Renshaw discusses the role for adjuvant radiotherapy followed by antiestrogen therapy. We discussed the risks, benefits, short, and long term effects of radiotherapy, and the patient is interested in proceeding. Dr. Lisbeth Renshaw discusses the delivery and logistics of radiotherapy and anticipates a course of 4 weeks with deep inspiration breath hold technique. Written consent is obtained and placed in the chart, a copy was provided to the patient. She will simulation on Friday at 3 pm.  In a visit lasting 25 minutes, greater than 50% of the time was spent face to face discussing her case, and coordinating the patient's care.   The above documentation reflects my direct findings during this shared patient visit. Please see the separate note by Dr. Lisbeth Renshaw on this date for the remainder of the patient's plan of care.    Carola Rhine, PAC

## 2017-02-23 NOTE — Progress Notes (Signed)
Please see the Nurse Progress Note in the MD Initial Consult Encounter for this patient. 

## 2017-02-24 ENCOUNTER — Other Ambulatory Visit: Payer: Self-pay | Admitting: *Deleted

## 2017-02-24 ENCOUNTER — Encounter: Payer: Self-pay | Admitting: *Deleted

## 2017-02-24 NOTE — Patient Outreach (Signed)
[facility]) Care Management  [place] Routine Home Visit 02/24/2017  Danielle [name] 29-Jun-1945 [id number]  Danielle [name] is a 71 y.o. female originally referred to [place] by EMMIprevent; [facility] telephonic RN CM sent referral to [place] for further evaluation/ assessment of care needs at home.Patient has history including, but not limited to, HTN/ HLD; Atrial Fibrillation on chronic anticoagulation therapy (ACT); sCHF, previous CVA x 2; CAD withpreviousCABGand AVR; and (L) hip fracture in 2017. Patient was recently diagnosed with (L) breast cancer and underwent lumpectomy on January 14, 2017.  HIPAA/ identity verified in person with patient during home visit.    Subjective: "I am keeping my spirits up.... I don't have time to be depressed, and I know I won't do as well if I let myself get down in the dumps."  Assessment:  Danielle [name] appears to have fully recuperated from her recent (L) breast biopsy/ lumpectomy surgery.  Danielle [name] remains committed to compliance in following her recommended plan of care in attending all provider appointments and by taking medications as prescribed.  Danielle [name]'s apprehension with her overall plan of care moving forward around her new diagnosis of breast cancer has improved, and she feels ready for upcoming radiation therapy.  Danielle [name] has well-established practices for self-health management of concurrent diagnosis of CHF, CAD and recent (L) hip replacement.  [name] would like to continue improving her mobility and has created a rough draft of her Advanced Directives.  Danielle [name] is committed to having advanced directives completed prior to completion of her radiation treatments.  Today, patient reports that she "is doing pretty good," andstates that her chronic (L) hip pain is giving her "a fit" due "this cold weather."  Patient is using wheelchair today due increased hip pain; reports pain manageable, currently at "4/10"  after medication, but states that when she doesn't have medication, "is about a 8-9/10."  Patient appears to be in no obvious/ apparent distress throughout entirety of today's very pleasant 45 minute home visit.  Patient further reports:  -- No new falls; continues using walker primarily; uses wheelchair during times of increased pain  -- Oncology radiation treatment to start "next week;" expected to last for 20 weeks; reports she has a close friend in [place] going through same thing she is, who provided support for her as she faces uncertainty around this new phase of her treatment.  States she will attend oncology appointment tomorrow for radiation marking, reports husband to transport her to this appointment.    -- [place] PT/ rehabilitation has been completed; states outpatient PT has been recommended, but reports she will not start this until radiation treatments are completed; continues to express desire to improve mobility  -- Provider appointments reviewed with patient today; patient continues working with [facility] CSW re: transportation options; verbalizes plans to re-schedule SCAT interview "soon;" for now, husband to transport to provider appointments  -- has and is taking all medications; medication reconciliation completed during home visit today; reports no changes to medications; patient continues to verbalize accurate understanding of purpose, dosing, and scheduling of her medications, and denies concerns/ questions around medications today  -- has completed "rough draft" of her Advanced Directive planning for living will and HCPOA; patient asks that I review these, and this was completed today.  Patient denies further questions around AD planning; we discussed importance of having these documents notarized and for her to take to provider appointments for uploading into EMR; patient states she will do "soon."  Positive reinforcement  provided to patient for taking steps to have this  important aspect of her care initiated.  Today, we discussed that patient has taken many steps to meet her previously established [facility] CCM goals; discussed [facility] CCM case closure, and patient verbalizes that she would like for [facility] CCM RN to remain involved in her care until her radiation treatments are underway, and she has a better understanding of what is involved with these treatments.  We agreed to re-visit possible case closure in 2 weeks  Patient denies further issues, concerns, or problems today. I confirmed that patient hasmy direct phone number, the main [facility] CM office phone number, and the [facility] CM 24-hour nurse advice phone number should issues arise prior to next scheduled [place] outreach, with scheduled telephone call in 2 weeks.  Objective:    BP 126/84   Pulse 72   Resp 16   Wt 219 lb (99.3 kg)   SpO2 98%   BMI 34.30 kg/m   Review of Systems  Constitutional: Negative.   Respiratory: Negative.  Negative for cough, shortness of breath and wheezing.   Cardiovascular: Negative.  Negative for chest pain and leg swelling.  Gastrointestinal: Negative.  Negative for abdominal pain and nausea.  Genitourinary: Negative.   Musculoskeletal: Positive for joint pain. Negative for falls.       Chronic (L) hip pain secondary to recent arthroplasty  Neurological: Negative.   Psychiatric/Behavioral: Negative.  Negative for depression. The patient is not nervous/anxious.    Physical Exam  Constitutional: She is oriented to person, place, and time. She appears well-developed and well-nourished. No distress.  Cardiovascular: Normal rate, regular rhythm, normal heart sounds and intact distal pulses.  Pulses:      Radial pulses are 2+ on the right side, and 2+ on the left side.       Dorsalis pedis pulses are 2+ on the right side, and 2+ on the left side.  Respiratory: Effort normal and breath sounds normal. No respiratory distress. She has no wheezes. She has no rales.  GI: Soft.  Bowel sounds are normal.  Musculoskeletal: She exhibits no edema.  Neurological: She is alert and oriented to person, place, and time.  Skin: Skin is warm and dry. No erythema.  Psychiatric: She has a normal mood and affect. Her behavior is normal. Judgment and thought content normal.   Encounter Medications:   Outpatient Encounter Medications as of 02/24/2017  Medication Sig Note  . albuterol (PROAIR HFA) 108 (90 Base) MCG/ACT inhaler Inhale 2 puffs into the lungs every 6 (six) hours as needed for wheezing or shortness of breath.  02/24/2017: Patient reports has needed recently   . alendronate (FOSAMAX) 70 MG tablet Take 1 tablet (70 mg total) by mouth every Saturday. with a full glass of water on an empty stomach.   . ALPRAZolam (XANAX) 0.25 MG tablet Take 1 tablet (0.25 mg total) by mouth 3 (three) times daily as needed for anxiety or sleep.   Marland Kitchen aspirin EC 81 MG tablet Take 81 mg daily by mouth.   Marland Kitchen atorvastatin (LIPITOR) 40 MG tablet Take 1 tablet (40 mg total) by mouth at bedtime.   . budesonide-formoterol (SYMBICORT) 160-4.5 MCG/ACT inhaler Inhale 2 puffs into the lungs 2 (two) times daily.   . carvedilol (COREG) 25 MG tablet TAKE 1 TABLET BY MOUTH TWICE DAILY   . Cholecalciferol (VITAMIN D3) 400 units tablet Take 1 tablet (400 Units total) by mouth daily.   . diclofenac sodium (VOLTAREN) 1 % GEL  Apply 4 g topically 4 (four) times daily. (Patient taking differently: Apply 4 g 4 (four) times daily as needed topically (for pain.). )   . diphenhydramine-acetaminophen (TYLENOL PM) 25-500 MG TABS tablet Take 1 tablet by mouth.   . furosemide (LASIX) 20 MG tablet Take 1 tablet (20 mg total) by mouth daily as needed for fluid or edema. (Patient taking differently: Take 20 mg daily by mouth. ) 01/24/2017: Patient takes QD   . losartan (COZAAR) 25 MG tablet TAKE 1 TABLET BY MOUTH ONCE DAILY   . methimazole (TAPAZOLE) 5 MG tablet Take 3 tablets (15 mg total) by mouth every evening.   . ranitidine  (ZANTAC) 150 MG tablet Take 1 tablet (150 mg total) by mouth 2 (two) times daily.   Marland Kitchen senna-docusate (SENOKOT-S) 8.6-50 MG tablet Take 2 tablets at bedtime by mouth.    . traMADol (ULTRAM) 50 MG tablet Take 1 tablet (50 mg total) by mouth 3 (three) times daily. (Patient taking differently: Take 50 mg 3 (three) times daily as needed by mouth (for pain (typically twice daily)). )   . warfarin (COUMADIN) 3 MG tablet Take as directed by anticoagulation clinic. (Patient taking differently: Take 3 mg every evening by mouth. Take as directed by anticoagulation clinic.)   . enoxaparin (LOVENOX) 100 MG/ML injection Inject 1 mL (100 mg total) into the skin 2 (two) times daily for 7 doses. 02/03/2017: Reports took last dose "last week;" on 02/03/17, patient confirms "no longer taking"  . oxyCODONE (OXY IR/ROXICODONE) 5 MG immediate release tablet Take 1 tablet (5 mg total) every 6 (six) hours as needed by mouth for moderate pain, severe pain or breakthrough pain. (Patient not taking: Reported on 02/23/2017)    No facility-administered encounter medications on file as of 02/24/2017.    Functional Status:   In your present state of health, do you have any difficulty performing the following activities: 01/17/2017 01/12/2017  Hearing? N N  Vision? N N  Difficulty concentrating or making decisions? N N  Walking or climbing stairs? Y Y  Dressing or bathing? N N  Doing errands, shopping? [name]  Preparing Food and eating ? N -  Using the Toilet? N -  In the past six months, have you accidently leaked urine? Y -  Comment reports wears Depends when necessary -  Do you have problems with loss of bowel control? N -  Managing your Medications? N -  Managing your Finances? N -  Housekeeping or managing your Housekeeping? Y -  Comment Family assists -  Some recent data might be hidden   Fall/Depression Screening:    Fall Risk  02/23/2017 02/23/2017 02/03/2017  Falls in the past year? No No (No Data)  Comment - - No  new falls reported by patient today; patient reports continuing to use assistive devices; primarily "walker"  Number falls in past yr: - - -  Injury with Fall? - - -  Risk Factor Category  - - -  Risk for fall due to : - - -  Risk for fall due to: Comment - - -  Follow up - - -   PHQ 2/9 Scores 01/18/2017 01/13/2017 12/07/2016 05/20/2016 01/02/2016 12/23/2015  PHQ - 2 Score 0 0 0 0 0 0  PHQ- 9 Score - - 10 - - -   Plan:  Patient will take medications as prescribed and will attend all scheduled provider appointments  Patient will actively participate with [facility] CSW for assessment of stated community resource needs  Patient will promptly notify medical providers/ care team for any new concerns, issues, or problems that arise  Patient will take steps to have Advanced Directive planning documents for living will and HCPOA notarized and uploaded into EMR  [facility] outreach to continue with scheduledtelephone call in 2 weeks   [facility] CM Care Plan Problem One     Most Recent Value  Care Plan Problem One  Patient uncertainty with plan of care around new medical diagnosis, as evidenced by patient reporting of same  Role Documenting the Problem One  Care Management [place] for Problem One  Active  [facility] Long Term Goal   Over the next 60 days, patient will be able to verbalize general plan of care around new diagnosis of breast cancer, as evidenced by patient reporting of same during [facility] RN CM outreach  [facility] Long Term Goal Start Date  01/17/17  Interventions for Problem One Long Term Goal  Discussed with patient current clinical status,  plan for upcoming new radiation treatments    [facility] CM Care Plan Problem Two     Most Recent Value  Care Plan Problem Two  Need for Advanced Directives to be created, as evidenced by patient reporting of same  Role Documenting the Problem Two  Care Management [place] for Problem Two  Active  Interventions for Problem Two Long  Term Goal   Reviewed patient's AD's packet and provided positive reinforcement for creating AD's,  using teachback method, answered all of patient's questions around AD planning and encouraged patient to have AD's notarized and uploaded to EMR  [facility] Long Term Goal  Over the next 60 days, patient will have Advanced Directives for HCPOA and living will in place, as evidenced by patient reporting of same  [place] Term Goal Start Date  01/17/17  [facility] CM Short Term Goal #1   Over the next 21 days, patient will review Advanced Directive literature provided to her today, as evidenced by patient reporting of same during [facility] RN CM outreach   [facility] CM Short Term Goal #1 Start Date  02/03/17  [facility] CM Short Term Goal #1 Met Date   02/24/17-- GOAL MET     Oneta Rack, RN, BSN, [place] Coordinator [facility] Care Management  [phone]

## 2017-02-25 ENCOUNTER — Ambulatory Visit
Admission: RE | Admit: 2017-02-25 | Discharge: 2017-02-25 | Disposition: A | Discharge status: Home / Self Care | Payer: Medicare Other | Source: Ambulatory Visit | Attending: Radiation Oncology | Admitting: Radiation Oncology

## 2017-02-25 DIAGNOSIS — Z87891 Personal history of nicotine dependence: Secondary | ICD-10-CM | POA: Diagnosis not present

## 2017-02-25 DIAGNOSIS — Z7982 Long term (current) use of aspirin: Secondary | ICD-10-CM | POA: Diagnosis not present

## 2017-02-25 DIAGNOSIS — I5022 Chronic systolic (congestive) heart failure: Secondary | ICD-10-CM | POA: Diagnosis not present

## 2017-02-25 DIAGNOSIS — Z170 Estrogen receptor positive status [ER+]: Principal | ICD-10-CM

## 2017-02-25 DIAGNOSIS — Z8673 Personal history of transient ischemic attack (TIA), and cerebral infarction without residual deficits: Secondary | ICD-10-CM | POA: Diagnosis not present

## 2017-02-25 DIAGNOSIS — C50412 Malignant neoplasm of upper-outer quadrant of left female breast: Secondary | ICD-10-CM | POA: Diagnosis not present

## 2017-02-25 DIAGNOSIS — I110 Hypertensive heart disease with heart failure: Secondary | ICD-10-CM | POA: Diagnosis not present

## 2017-02-25 DIAGNOSIS — Z833 Family history of diabetes mellitus: Secondary | ICD-10-CM | POA: Diagnosis not present

## 2017-02-25 DIAGNOSIS — Z510 Encounter for antineoplastic radiation therapy: Secondary | ICD-10-CM | POA: Diagnosis not present

## 2017-02-25 DIAGNOSIS — J449 Chronic obstructive pulmonary disease, unspecified: Secondary | ICD-10-CM | POA: Diagnosis not present

## 2017-02-25 DIAGNOSIS — Z79899 Other long term (current) drug therapy: Secondary | ICD-10-CM | POA: Diagnosis not present

## 2017-02-25 DIAGNOSIS — Z801 Family history of malignant neoplasm of trachea, bronchus and lung: Secondary | ICD-10-CM | POA: Diagnosis not present

## 2017-02-25 DIAGNOSIS — Z9889 Other specified postprocedural states: Secondary | ICD-10-CM | POA: Diagnosis not present

## 2017-02-25 NOTE — Progress Notes (Signed)
  Radiation Oncology         (336) [phone] ________________________________  Name: Jerita Wimbush MRN: [id number]  Date: 02/25/2017  DOB: 30-Oct-1945   DIAGNOSIS:     ICD-10-CM   1. Malignant neoplasm of upper-outer quadrant of left breast in female, estrogen receptor positive ([place]) C50.412    Z17.0     SIMULATION AND TREATMENT PLANNING NOTE  The patient presented for simulation prior to beginning her course of radiation treatment for her diagnosis of left-sided breast cancer. The patient was placed in a supine position on a breast board. A customized vac-lock bag was constructed and this complex treatment device will be used on a daily basis during her treatment. In this fashion, a CT scan was obtained through the chest area and an isocenter was placed near the chest wall within the breast.  The patient will be planned to receive a course of radiation initially to a dose of 42.56 Gy. This will consist of a whole breast radiotherapy technique. To accomplish this, 2 customized blocks have been designed which will correspond to medial and lateral whole breast tangent fields. This treatment will be accomplished at 2.66 Gy per fraction. A forward planning technique will also be evaluated to determine if this approach improves the plan. It is anticipated that the patient will then receive a 10 Gy boost to the seroma cavity which has been contoured. This will be accomplished at 2.5 Gy per fraction.   This initial treatment will consist of a 3-D conformal technique. The seroma has been contoured as the primary target structure. Additionally, dose volume histograms of both this target as well as the lungs and heart will also be evaluated. Such an approach is necessary to ensure that the target area is adequately covered while the nearby critical  normal structures are adequately spared.  Plan:  The final anticipated total dose therefore will correspond to 50 Gy.    Special treatment  procedure was performed today due to the extra time and effort required by myself to plan and prepare this patient for deep inspiration breath hold technique.  I have determined cardiac sparing to be of benefit to this patient to prevent long term cardiac damage due to radiation of the heart.  Bellows were placed on the patient's abdomen. To facilitate cardiac sparing, the patient was coached by the radiation therapists on breath hold techniques and breathing practice was performed. Practice waveforms were obtained. The patient was then scanned while maintaining breath hold in the treatment position.  This image was then transferred over to the imaging specialist. The imaging specialist then created a fusion of the free breathing and breath hold scans using the chest wall as the stable structure. I personally reviewed the fusion in axial, coronal and sagittal image planes.  Excellent cardiac sparing was obtained.  I felt the patient is an appropriate candidate for breath hold and the patient will be treated as such.  The image fusion was then reviewed with the patient to reinforce the necessity of reproducible breath hold.     _______________________________   Jodelle Gross, MD, PhD

## 2017-02-25 NOTE — Progress Notes (Signed)
  Radiation Oncology         (336) [phone] ________________________________  Name: Danielle [name] MRN: [id number]  Date: 02/25/2017  DOB: 1945-04-28  Optical Surface Tracking Plan:  Since intensity modulated radiotherapy (IMRT) and 3D conformal radiation treatment methods are predicated on accurate and precise positioning for treatment, intrafraction motion monitoring is medically necessary to ensure accurate and safe treatment delivery.  The ability to quantify intrafraction motion without excessive ionizing radiation dose can only be performed with optical surface tracking. Accordingly, surface imaging offers the opportunity to obtain 3D measurements of patient position throughout IMRT and 3D treatments without excessive radiation exposure.  I am ordering optical surface tracking for this patient's upcoming course of radiotherapy. ________________________________  Kyung Rudd, MD 02/25/2017 6:28 PM    Reference:   Particia Jasper, et al. Surface imaging-based analysis of intrafraction motion for breast radiotherapy patients.Journal of [place], n. 6, nov. 2014. ISSN [id number].   Available at: <http://www.jacmp.org/index.php/jacmp/article/view/4957>.

## 2017-02-28 ENCOUNTER — Telehealth: Payer: Self-pay | Admitting: Physical Medicine & Rehabilitation

## 2017-02-28 DIAGNOSIS — G894 Chronic pain syndrome: Secondary | ICD-10-CM

## 2017-02-28 DIAGNOSIS — M159 Polyosteoarthritis, unspecified: Secondary | ICD-10-CM

## 2017-02-28 NOTE — Telephone Encounter (Signed)
Patient has an appointment on 03/08/17 with Dr. Letta Pate but is requesting a refill on Tramadol.  She states she is starting Chemo on 03/07/17 and not sure if she will make it to that appointment.  Please call patient.

## 2017-03-04 ENCOUNTER — Other Ambulatory Visit: Payer: Self-pay | Admitting: Licensed Clinical Social Worker

## 2017-03-04 ENCOUNTER — Other Ambulatory Visit: Payer: Self-pay | Admitting: *Deleted

## 2017-03-04 ENCOUNTER — Telehealth: Payer: Self-pay | Admitting: Family Medicine

## 2017-03-04 DIAGNOSIS — Z510 Encounter for antineoplastic radiation therapy: Secondary | ICD-10-CM | POA: Diagnosis not present

## 2017-03-04 DIAGNOSIS — Z79899 Other long term (current) drug therapy: Secondary | ICD-10-CM | POA: Diagnosis not present

## 2017-03-04 DIAGNOSIS — Z8673 Personal history of transient ischemic attack (TIA), and cerebral infarction without residual deficits: Secondary | ICD-10-CM | POA: Diagnosis not present

## 2017-03-04 DIAGNOSIS — Z9889 Other specified postprocedural states: Secondary | ICD-10-CM | POA: Diagnosis not present

## 2017-03-04 DIAGNOSIS — I110 Hypertensive heart disease with heart failure: Secondary | ICD-10-CM | POA: Diagnosis not present

## 2017-03-04 DIAGNOSIS — Z7982 Long term (current) use of aspirin: Secondary | ICD-10-CM | POA: Diagnosis not present

## 2017-03-04 DIAGNOSIS — J449 Chronic obstructive pulmonary disease, unspecified: Secondary | ICD-10-CM | POA: Diagnosis not present

## 2017-03-04 DIAGNOSIS — Z170 Estrogen receptor positive status [ER+]: Secondary | ICD-10-CM | POA: Diagnosis not present

## 2017-03-04 DIAGNOSIS — I5022 Chronic systolic (congestive) heart failure: Secondary | ICD-10-CM | POA: Diagnosis not present

## 2017-03-04 DIAGNOSIS — Z87891 Personal history of nicotine dependence: Secondary | ICD-10-CM | POA: Diagnosis not present

## 2017-03-04 DIAGNOSIS — Z801 Family history of malignant neoplasm of trachea, bronchus and lung: Secondary | ICD-10-CM | POA: Diagnosis not present

## 2017-03-04 DIAGNOSIS — Z833 Family history of diabetes mellitus: Secondary | ICD-10-CM | POA: Diagnosis not present

## 2017-03-04 DIAGNOSIS — C50412 Malignant neoplasm of upper-outer quadrant of left female breast: Secondary | ICD-10-CM | POA: Diagnosis not present

## 2017-03-04 MED ORDER — VALACYCLOVIR HCL 1 G PO TABS: 1000.0000 mg | ORAL_TABLET | Freq: Two times a day (BID) | ORAL | 0 refills | Status: DC

## 2017-03-04 NOTE — Telephone Encounter (Signed)
Copied from [place] [id number]. Topic: General - Other >> Mar 04, 2017 10:39 AM Carolyn Stare wrote:   Pt call to say the Dr [name] had written her a rx for Valacycoclovir and she has no refills and is asking if she can have this refill not showing on her med list   [place]

## 2017-03-04 NOTE — Telephone Encounter (Signed)
I did not find visit when this medication was dicussed neither note or phone encounter associated with this medication. If I did authorize Rx for Valacyclovir before and did not give her refills, it was most likely upon request w/o visit, I am not sure. This needs to be discuss during OV [since I cannot find documentation of problem associated with this medication and it is not on her med list either].  Thanks, BJ

## 2017-03-04 NOTE — Telephone Encounter (Signed)
Message routed to Dr. [name] for review 

## 2017-03-04 NOTE — Patient Outreach (Signed)
  [facility]) Care Management  03/04/2017  Danielle [name] 08-Feb-1946 [id number]  Assessment-CSW completed outreach attempt today to follow up on SCAT. CSW unable to reach patient successfully. CSW left a HIPPA compliant voice message encouraging patient to return call once available.  Plan-CSW will await return call or complete an additional outreach if needed within two weeks.  [name], BSW, MSW, [email or web address] Phone: [phone] Fax: [phone]

## 2017-03-04 NOTE — Telephone Encounter (Signed)
Informed patient that med wasn't prescribed by PCP originally, but due to her having an outbreak, medication was sent to pharmacy.

## 2017-03-07 ENCOUNTER — Telehealth: Payer: Self-pay | Admitting: Hematology

## 2017-03-07 ENCOUNTER — Ambulatory Visit
Admission: RE | Admit: 2017-03-07 | Discharge: 2017-03-07 | Disposition: A | Discharge status: Home / Self Care | Payer: Medicare Other | Source: Ambulatory Visit | Attending: Radiation Oncology | Admitting: Radiation Oncology

## 2017-03-07 DIAGNOSIS — Z79899 Other long term (current) drug therapy: Secondary | ICD-10-CM | POA: Diagnosis not present

## 2017-03-07 DIAGNOSIS — Z170 Estrogen receptor positive status [ER+]: Secondary | ICD-10-CM | POA: Diagnosis not present

## 2017-03-07 DIAGNOSIS — Z87891 Personal history of nicotine dependence: Secondary | ICD-10-CM | POA: Diagnosis not present

## 2017-03-07 DIAGNOSIS — I110 Hypertensive heart disease with heart failure: Secondary | ICD-10-CM | POA: Diagnosis not present

## 2017-03-07 DIAGNOSIS — C50412 Malignant neoplasm of upper-outer quadrant of left female breast: Secondary | ICD-10-CM | POA: Diagnosis not present

## 2017-03-07 DIAGNOSIS — Z510 Encounter for antineoplastic radiation therapy: Secondary | ICD-10-CM | POA: Diagnosis not present

## 2017-03-07 DIAGNOSIS — Z8673 Personal history of transient ischemic attack (TIA), and cerebral infarction without residual deficits: Secondary | ICD-10-CM | POA: Diagnosis not present

## 2017-03-07 DIAGNOSIS — Z801 Family history of malignant neoplasm of trachea, bronchus and lung: Secondary | ICD-10-CM | POA: Diagnosis not present

## 2017-03-07 DIAGNOSIS — J449 Chronic obstructive pulmonary disease, unspecified: Secondary | ICD-10-CM | POA: Diagnosis not present

## 2017-03-07 DIAGNOSIS — Z9889 Other specified postprocedural states: Secondary | ICD-10-CM | POA: Diagnosis not present

## 2017-03-07 DIAGNOSIS — Z833 Family history of diabetes mellitus: Secondary | ICD-10-CM | POA: Diagnosis not present

## 2017-03-07 DIAGNOSIS — Z7982 Long term (current) use of aspirin: Secondary | ICD-10-CM | POA: Diagnosis not present

## 2017-03-07 DIAGNOSIS — I5022 Chronic systolic (congestive) heart failure: Secondary | ICD-10-CM | POA: Diagnosis not present

## 2017-03-07 NOTE — Telephone Encounter (Signed)
Scheduled appt per 1/2 sch message - unable to schedule week of 2/4 due to YF on PAL and next week unable to do due to patients schedule - scheduled for 2/18 - patient only able to come in after 3 due to transportation.

## 2017-03-08 ENCOUNTER — Encounter: Payer: Medicare Other | Attending: Physical Medicine & Rehabilitation

## 2017-03-08 ENCOUNTER — Ambulatory Visit
Admission: RE | Admit: 2017-03-08 | Discharge: 2017-03-08 | Disposition: A | Discharge status: Home / Self Care | Payer: Medicare Other | Source: Ambulatory Visit | Attending: Radiation Oncology | Admitting: Radiation Oncology

## 2017-03-08 ENCOUNTER — Encounter: Payer: Self-pay | Admitting: Physical Medicine & Rehabilitation

## 2017-03-08 ENCOUNTER — Ambulatory Visit: Payer: Medicare Other | Admitting: Physical Medicine & Rehabilitation

## 2017-03-08 VITALS — BP 142/85 | HR 78

## 2017-03-08 DIAGNOSIS — G8929 Other chronic pain: Secondary | ICD-10-CM

## 2017-03-08 DIAGNOSIS — Z8673 Personal history of transient ischemic attack (TIA), and cerebral infarction without residual deficits: Secondary | ICD-10-CM | POA: Diagnosis not present

## 2017-03-08 DIAGNOSIS — G894 Chronic pain syndrome: Secondary | ICD-10-CM | POA: Diagnosis not present

## 2017-03-08 DIAGNOSIS — M159 Polyosteoarthritis, unspecified: Secondary | ICD-10-CM | POA: Diagnosis not present

## 2017-03-08 DIAGNOSIS — D0512 Intraductal carcinoma in situ of left breast: Secondary | ICD-10-CM

## 2017-03-08 DIAGNOSIS — Z7982 Long term (current) use of aspirin: Secondary | ICD-10-CM | POA: Diagnosis not present

## 2017-03-08 DIAGNOSIS — I110 Hypertensive heart disease with heart failure: Secondary | ICD-10-CM | POA: Diagnosis not present

## 2017-03-08 DIAGNOSIS — Z7901 Long term (current) use of anticoagulants: Secondary | ICD-10-CM | POA: Diagnosis not present

## 2017-03-08 DIAGNOSIS — M25562 Pain in left knee: Secondary | ICD-10-CM | POA: Diagnosis not present

## 2017-03-08 DIAGNOSIS — M545 Low back pain: Secondary | ICD-10-CM | POA: Insufficient documentation

## 2017-03-08 DIAGNOSIS — I5022 Chronic systolic (congestive) heart failure: Secondary | ICD-10-CM | POA: Diagnosis not present

## 2017-03-08 DIAGNOSIS — Z510 Encounter for antineoplastic radiation therapy: Secondary | ICD-10-CM | POA: Diagnosis not present

## 2017-03-08 DIAGNOSIS — M810 Age-related osteoporosis without current pathological fracture: Secondary | ICD-10-CM | POA: Diagnosis not present

## 2017-03-08 DIAGNOSIS — M25561 Pain in right knee: Secondary | ICD-10-CM | POA: Diagnosis not present

## 2017-03-08 DIAGNOSIS — M5442 Lumbago with sciatica, left side: Secondary | ICD-10-CM

## 2017-03-08 DIAGNOSIS — Z87891 Personal history of nicotine dependence: Secondary | ICD-10-CM | POA: Diagnosis not present

## 2017-03-08 DIAGNOSIS — Z9889 Other specified postprocedural states: Secondary | ICD-10-CM | POA: Diagnosis not present

## 2017-03-08 DIAGNOSIS — Z833 Family history of diabetes mellitus: Secondary | ICD-10-CM | POA: Diagnosis not present

## 2017-03-08 DIAGNOSIS — Z170 Estrogen receptor positive status [ER+]: Secondary | ICD-10-CM | POA: Diagnosis not present

## 2017-03-08 DIAGNOSIS — C50412 Malignant neoplasm of upper-outer quadrant of left female breast: Secondary | ICD-10-CM | POA: Diagnosis not present

## 2017-03-08 DIAGNOSIS — Z801 Family history of malignant neoplasm of trachea, bronchus and lung: Secondary | ICD-10-CM | POA: Diagnosis not present

## 2017-03-08 DIAGNOSIS — Z79899 Other long term (current) drug therapy: Secondary | ICD-10-CM | POA: Diagnosis not present

## 2017-03-08 DIAGNOSIS — J449 Chronic obstructive pulmonary disease, unspecified: Secondary | ICD-10-CM | POA: Diagnosis not present

## 2017-03-08 MED ORDER — TRAMADOL HCL 50 MG PO TABS: 50.0000 mg | ORAL_TABLET | Freq: Three times a day (TID) | ORAL | 5 refills | Status: DC

## 2017-03-08 MED ORDER — GABAPENTIN 100 MG PO CAPS: 100.0000 mg | ORAL_CAPSULE | Freq: Three times a day (TID) | ORAL | 1 refills | Status: DC

## 2017-03-08 NOTE — Patient Instructions (Signed)
Will order MRI of the spine to evaluate low back and left thight , knee and leg pain  May need to do spine injections depending on MRI results

## 2017-03-08 NOTE — Addendum Note (Signed)
Addended by: [name] on: 03/08/2017 04:31 PM   Modules accepted: Orders

## 2017-03-08 NOTE — Progress Notes (Addendum)
Subjective:    Patient ID: Danielle [name], female    DOB: 28-Apr-1945, 72 y.o.   MRN: [id number]  HPI  CC: Left  knee pain and Left hip pain  Interval history Left breast carcinoma diagnosed, invasive ductal carcinoma Lumpectomy no chemo needed, negative nodes, radiation therapy just started with a 4wk  Completed outpt rehab, mainly left shoulder ROM, no lymphedema in LUE Dressing and bathing independantly uses shower chair Walks with walker, goal of pt is to walk with cane  Patient has previously been treated for her knee pain but did not get any relief with corticosteroid injections, hyaluronic acid injections, Voltaren gel. She does not note any numbness in the left leg.  She does have weakness however that was the side where she had had a prior stroke in 2000. Other past medical history significant for AVR bioprosthetic valve Coumadin for atrial fibrillation. Pain Inventory Average Pain 7 Pain Right Now 8 My pain is sharp and aching  In the last 24 hours, has pain interfered with the following? General activity 8 Relation with others 8 Enjoyment of life 8 What TIME of day is your pain at its worst? . Sleep (in general) Fair  Pain is worse with: walking and standing Pain improves with: rest and medication Relief from Meds: 8  Mobility use a walker use a wheelchair  Function retired  Neuro/Psych trouble walking  Prior Studies Any changes since last visit?  no  Physicians involved in your care Any changes since last visit?  no   Family History  Problem Relation Age of Onset  . Diabetes Mother   . Heart attack Mother 32  . Diabetes Father   . Lung cancer Father   . Diabetes Sister   . Thyroid disease Sister   . Diabetes Sister   . HIV Brother    Social History   Socioeconomic History  . Marital status: Married    Spouse name: Sherwood  . Number of children: 0  . Years of education: Not on file  . Highest education level: Not on file    Social Needs  . Financial resource strain: Not on file  . Food insecurity - worry: Not on file  . Food insecurity - inability: Not on file  . Transportation needs - medical: Not on file  . Transportation needs - non-medical: Not on file  Occupational History  . Occupation: Retired in 2004  Tobacco Use  . Smoking status: Former Smoker    Packs/day: 0.25    Years: 10.00    Pack years: 2.50    Types: Cigarettes    Last attempt to quit: 03/01/2010    Years since quitting: 7.0  . Smokeless tobacco: Never Used  Substance and Sexual Activity  . Alcohol use: No  . Drug use: No  . Sexual activity: Not on file  Other Topics Concern  . Not on file  Social History Narrative   Lives with husband.  Ambulated independently.   Past Surgical History:  Procedure Laterality Date  . BREAST LUMPECTOMY WITH RADIOACTIVE SEED AND SENTINEL LYMPH NODE BIOPSY Left 01/14/2017   Procedure: LEFT BREAST LUMPECTOMY WITH RADIOACTIVE SEED AND SENTINEL LYMPH NODE BIOPSY;  Surgeon: Rolm Bookbinder, MD;  Location: [facility];  Service: General;  Laterality: Left;  . CHEST TUBE INSERTION  10/2015  . INTRAMEDULLARY (IM) NAIL INTERTROCHANTERIC Left 12/10/2015   Procedure: INTRAMEDULLARY (IM) NAIL INTERTROCHANTRIC;  Surgeon: Leandrew Koyanagi, MD;  Location: [place];  Service: Orthopedics;  Laterality: Left;  . PLEURADESIS  Left 12/03/2015   Procedure: PLEURADESIS;  Surgeon: Melrose Nakayama, MD;  Location: [place];  Service: Thoracic;  Laterality: Left;  . RESECTION OF APICAL BLEB Left 12/03/2015   Procedure: BLEBECTOMY;  Surgeon: Melrose Nakayama, MD;  Location: [place];  Service: Thoracic;  Laterality: Left;  . THORACOSCOPY  12/03/2015  . VALVE REPLACEMENT    . VIDEO ASSISTED THORACOSCOPY Left 12/03/2015   Procedure: VIDEO ASSISTED THORACOSCOPY;  Surgeon: Melrose Nakayama, MD;  Location: [place];  Service: Thoracic;  Laterality: Left;   Past Medical History:  Diagnosis Date  . Allergy   . Anxiety   . Aortic  stenosis    Status post bioprosthetic AVR  . Arthritis    DJD  . Asthma   . Breast cancer ([place]) 12/13/2016   Left breast  . Chronic systolic CHF (congestive heart failure) ([place])    EF 30% 04/2014  . COPD (chronic obstructive pulmonary disease) ([place])   . Coronary artery disease    per pt, had LHC prior to AVR in [place] that did not show any blockages; no stents/bypass  . Gastritis   . Hyperlipidemia   . Hypertension   . Hyperthyroidism   . Multiple thyroid nodules   . Osteoporosis   . Paroxysmal atrial fibrillation (H[facility])   . Pelvis fracture ([place]) 08/19/2015   MULTIPLE   . Spontaneous pneumothorax 11/28/2015   left   . Stroke [facility]) 2000   rt hand weak   There were no vitals taken for this visit.  Opioid Risk Score:   Fall Risk Score:  `1  Depression screen PHQ 2/9  Depression screen [facility] 2/9 01/18/2017 01/13/2017 12/07/2016 05/20/2016 01/02/2016 12/23/2015  Decreased Interest 0 0 0 0 0 0  Down, Depressed, Hopeless 0 0 0 0 0 0  PHQ - 2 Score 0 0 0 0 0 0  Altered sleeping - - 2 - - -  Tired, decreased energy - - 2 - - -  Change in appetite - - 2 - - -  Feeling bad or failure about yourself  - - 0 - - -  Trouble concentrating - - 2 - - -  Moving slowly or fidgety/restless - - 2 - - -  Suicidal thoughts - - 0 - - -  PHQ-9 Score - - 10 - - -  Difficult doing work/chores - - Not difficult at all - - -     Review of Systems  Constitutional: Negative.   HENT: Negative.   Eyes: Negative.   Respiratory: Positive for shortness of breath and wheezing.   Cardiovascular: Negative.   Gastrointestinal: Negative.   Endocrine: Negative.   Genitourinary: Negative.   Musculoskeletal: Negative.   Skin: Negative.   Allergic/Immunologic: Negative.   Neurological: Negative.   Hematological: Negative.   Psychiatric/Behavioral: Negative.   All other systems reviewed and are negative.      Objective:   Physical Exam  Constitutional: She appears well-developed and  well-nourished. No distress.  HENT:  Head: Normocephalic and atraumatic.  Eyes: Conjunctivae and EOM are normal. Pupils are equal, round, and reactive to light.  Neck: Normal range of motion.  Neurological:  Reflex Scores:      Tricep reflexes are 2+ on the right side and 2+ on the left side.      Bicep reflexes are 2+ on the right side and 2+ on the left side.      Brachioradialis reflexes are 2+ on the right side and 2+ on the left side.  Patellar reflexes are 0 on the right side and 0 on the left side.      Achilles reflexes are 2+ on the right side and 2+ on the left side. Skin: She is not diaphoretic.  Nursing note and vitals reviewed.  Hyperesthesia to pinprick Left knee but no allodynia Patient is tenderness palpation on the left side of the lumbar paraspinals. Is limited lumbar range of motion approximately 50% flexion extension lateral rotation and bending Negative straight leg raising Knee has no evidence of effusion bilaterally.  She has some mild tenderness in the popliteal area however no masses are palpable.  She has no pain with knee range of motion bilaterally. Pinprick exam is normal bilateral upper and lower limbs she does have some hyperesthesia around the left knee compared to the right knee but no sensory loss. Motor strength is 4 bilateral deltoid 4 in the left biceps triceps grip 5 in the right biceps triceps grip 4 in the right hip flexor knee extensor ankle dorsiflexor 4- in the left hip flexor knee extensor ankle dorsiflexor      Assessment & Plan:  1. Chronic low back pain after a fall approximately 1 year ago. This fall caused a left hip fracture which has healed. However, she has had persistent low back pain , I suspect she has some underlying lumbar spondylosis degenerative disc and possibly lumbar stenosis. 2.  Left knee, hip and lateral leg pain, , has had steroid injections as well as viscous supplementation, these have not been helpful. Also tried  Voltaren gel, which was not helpful .  The patient may be having radicular pain causing lower extremity pain rather than osteoarthritis. Reviewed left hip x-rays which show ORIF with intact hardware  Will add gabapentin 100 mg 3 times daily  Have ordered lumbar MRI to further evaluate.  3.  Reduced balance, peripheral musculoskeletal balance disorder, also with history of stroke. October 2017 fall basically started her low back pain episode. She has osteoporosis, also on Coumadin. Will ask physical therapy to help with lower extremity strengthening, increase balance strategies, this has reportedly been ordered by Dr. Donne Hazel according to the patient  4.  Intraductal carcinoma has some postoperative pain in the left breast as well. Continue tramadol 50 mg 3 times daily  Physical medicine rehabilitation follow-up 3 weeks

## 2017-03-09 ENCOUNTER — Other Ambulatory Visit: Payer: Self-pay | Admitting: Licensed Clinical Social Worker

## 2017-03-09 ENCOUNTER — Ambulatory Visit
Admission: RE | Admit: 2017-03-09 | Discharge: 2017-03-09 | Disposition: A | Discharge status: Home / Self Care | Payer: Medicare Other | Source: Ambulatory Visit | Attending: Radiation Oncology | Admitting: Radiation Oncology

## 2017-03-09 ENCOUNTER — Encounter: Payer: Self-pay | Admitting: *Deleted

## 2017-03-09 ENCOUNTER — Ambulatory Visit: Payer: Medicare Other

## 2017-03-09 ENCOUNTER — Other Ambulatory Visit: Payer: Self-pay | Admitting: *Deleted

## 2017-03-09 DIAGNOSIS — Z79899 Other long term (current) drug therapy: Secondary | ICD-10-CM | POA: Diagnosis not present

## 2017-03-09 DIAGNOSIS — Z87891 Personal history of nicotine dependence: Secondary | ICD-10-CM | POA: Diagnosis not present

## 2017-03-09 DIAGNOSIS — J449 Chronic obstructive pulmonary disease, unspecified: Secondary | ICD-10-CM | POA: Diagnosis not present

## 2017-03-09 DIAGNOSIS — Z8673 Personal history of transient ischemic attack (TIA), and cerebral infarction without residual deficits: Secondary | ICD-10-CM | POA: Diagnosis not present

## 2017-03-09 DIAGNOSIS — Z510 Encounter for antineoplastic radiation therapy: Secondary | ICD-10-CM | POA: Diagnosis not present

## 2017-03-09 DIAGNOSIS — Z833 Family history of diabetes mellitus: Secondary | ICD-10-CM | POA: Diagnosis not present

## 2017-03-09 DIAGNOSIS — I5022 Chronic systolic (congestive) heart failure: Secondary | ICD-10-CM | POA: Diagnosis not present

## 2017-03-09 DIAGNOSIS — Z170 Estrogen receptor positive status [ER+]: Secondary | ICD-10-CM | POA: Diagnosis not present

## 2017-03-09 DIAGNOSIS — C50412 Malignant neoplasm of upper-outer quadrant of left female breast: Secondary | ICD-10-CM | POA: Diagnosis not present

## 2017-03-09 DIAGNOSIS — Z9889 Other specified postprocedural states: Secondary | ICD-10-CM | POA: Diagnosis not present

## 2017-03-09 DIAGNOSIS — Z7982 Long term (current) use of aspirin: Secondary | ICD-10-CM | POA: Diagnosis not present

## 2017-03-09 DIAGNOSIS — Z801 Family history of malignant neoplasm of trachea, bronchus and lung: Secondary | ICD-10-CM | POA: Diagnosis not present

## 2017-03-09 DIAGNOSIS — I110 Hypertensive heart disease with heart failure: Secondary | ICD-10-CM | POA: Diagnosis not present

## 2017-03-09 NOTE — Patient Outreach (Addendum)
[facility]) Care Management [place] Telephone Outreach  03/09/2017  Danielle [name] 10/05/1945 [id number]  Successful telephone outreach to Danielle [name] is a 72 y.o. female originally referred to [facility] by EMMIprevent; [facility] telephonic RN CM sent referral to [facility] for further evaluation/ assessment of care needs at home.Patient has history including, but not limited to, HTN/ HLD; Atrial Fibrillation on chronic anticoagulation therapy (ACT); sCHF, previous CVA x 2; CAD withpreviousCABGand AVR; and (L) hip fracture in 2017. Patient was recently diagnosed with (L) breast cancer and underwent lumpectomy on January 14, 2017.  HIPAA/ identity verified with patient during phone call today.    Today, patient reports that she "is doing real good," andstates that she is now attending daily radiation treatments at the cancer center, as of Monday 03/07/17; patient states that she "has been surprised" at "easy it has been" on her, and she reports that she is tolerating treatments "great."  Patient reports "normal" chronic hip pain, and states "it's not too bad right now."  Patient sounds to be in no obvious/ apparent distress throughout phone call today.  Patient further reports:  -- No new falls; continues using walker primarily indoors at home; uses wheelchair during times of increased pain, or when she goes out  -- Oncology radiation treatments occur "every day, Monday- Friday."  Reports sessions "going great; they only last for a very few minutes."  -- Provider appointments reviewed with patient today; patient attended recent pain management provider office visit, and states that he has ordered an MRI for the future to re-evaluate hip/ leg pain; husband continues transporting patient to daily radiation treatments.  --continues working with [facility] CSW re: transportation options; verbalizes plans to re-schedule SCAT interview "soon;"  stating that she contacted the SCAT staff, and was told that they were booked up until the end of February; patient reports she is to call today to set this appointment up.  -- has and is taking all medications; denies concerns/ questions around medications today  -- Patient denies further questions around AD planning; we again discussed importance of having these documents notarized and for her to take to provider appointments for uploading into EMR; patient states she will do "soon," as she has been very busy with her daily radiation treatments.  Positive reinforcement again provided to patient for taking steps to have this important aspect of her care initiated.  Today, we again discussed possibility of [facility] CCM case closure, and patient verbalizes that she would like to have one more phone call since her radiation treatments just started this week; agreed that I would contact patient again in approximately 2 weeks for possible [facility] CCM case closure, and patient verbalizes understanding and agreement with this general plan.  Patient denies further issues, concerns, or problems today. I confirmed that patient hasmy direct phone number, the main [facility] CM office phone number, and the [facility] CM 24-hour nurse advice phone number should issues arise prior to next scheduled [place] outreach, with scheduled telephone call in 2 weeks.  Plan:  Patient will take medications as prescribed and will attend all scheduled provider appointments  Patient will actively participate with [facility] CSW for assessment of stated community resource needs  Patient will promptly notify medical providers/ care team for any new concerns, issues, or problems that arise  Patient will take steps to have Advanced Directive planning documents for living will and HCPOA notarized and uploaded into EMR  [place] outreach to continue with  scheduledtelephone call in 2 weeks  [facility] CM Care Plan Problem One     Most  Recent Value  Care Plan Problem One  Patient uncertainty with plan of care around new medical diagnosis, as evidenced by patient reporting of same  Role Documenting the Problem One  Care Management Industry for Problem One  Active  [facility] Long Term Goal   Over the next 60 days, patient will be able to verbalize general plan of care around new diagnosis of breast cancer, as evidenced by patient reporting of same during [facility] RN CM outreach  [facility] Long Term Goal Start Date  01/17/17  Interventions for Problem One Long Term Goal  Discussed with patient current clinical status,  new radiation treatments that started this week,  discussed overall plan of care and provider appointments with patient    [facility] CM Care Plan Problem Two     Most Recent Value  Care Plan Problem Two  Need for Advanced Directives to be created, as evidenced by patient reporting of same  Role Documenting the Problem Two  Care Management [place] for Problem Two  Active  Interventions for Problem Two Long Term Goal   Discussd with patient her progress with having AD's notarized and uploaded to EMR, and provided options to patient on where she might have documents notarized  [facility] Long Term Goal  Over the next 60 days, patient will have Advanced Directives for HCPOA and living will in place, as evidenced by patient reporting of same  [place] Term Goal Start Date  01/17/17     Oneta Rack, RN, BSN, [place] Care Management  [phone]

## 2017-03-09 NOTE — Patient Outreach (Signed)
[facility]) Care Management  03/09/2017  Danielle [name] December 12, 1945 [id number]  Assessment- CSW received incoming call from patient. She reports that she contacted [facility] today to reschedule her in person interview appointment. Patient was informed that they have no availability for intake appointments until February of 2019. However, someone from [facility] is suppose to be returning a phone call to her to confirm her intake appointment. Patient is agreeable to contact this CSW with that information once an appointment has been confirmed.  Plan-CSW will follow up within two weeks and continue to assist patient with gaining stable transportation.  [name], BSW, MSW, [email or web address] Phone: [phone] Fax: [phone]

## 2017-03-10 ENCOUNTER — Ambulatory Visit
Admission: RE | Admit: 2017-03-10 | Discharge: 2017-03-10 | Disposition: A | Discharge status: Home / Self Care | Payer: Medicare Other | Source: Ambulatory Visit | Attending: Radiation Oncology | Admitting: Radiation Oncology

## 2017-03-10 DIAGNOSIS — J449 Chronic obstructive pulmonary disease, unspecified: Secondary | ICD-10-CM | POA: Diagnosis not present

## 2017-03-10 DIAGNOSIS — Z79899 Other long term (current) drug therapy: Secondary | ICD-10-CM | POA: Diagnosis not present

## 2017-03-10 DIAGNOSIS — C50412 Malignant neoplasm of upper-outer quadrant of left female breast: Secondary | ICD-10-CM | POA: Diagnosis not present

## 2017-03-10 DIAGNOSIS — Z833 Family history of diabetes mellitus: Secondary | ICD-10-CM | POA: Diagnosis not present

## 2017-03-10 DIAGNOSIS — Z170 Estrogen receptor positive status [ER+]: Secondary | ICD-10-CM | POA: Diagnosis not present

## 2017-03-10 DIAGNOSIS — Z7982 Long term (current) use of aspirin: Secondary | ICD-10-CM | POA: Diagnosis not present

## 2017-03-10 DIAGNOSIS — Z510 Encounter for antineoplastic radiation therapy: Secondary | ICD-10-CM | POA: Diagnosis not present

## 2017-03-10 DIAGNOSIS — Z9889 Other specified postprocedural states: Secondary | ICD-10-CM | POA: Diagnosis not present

## 2017-03-10 DIAGNOSIS — Z801 Family history of malignant neoplasm of trachea, bronchus and lung: Secondary | ICD-10-CM | POA: Diagnosis not present

## 2017-03-10 DIAGNOSIS — Z87891 Personal history of nicotine dependence: Secondary | ICD-10-CM | POA: Diagnosis not present

## 2017-03-10 DIAGNOSIS — Z8673 Personal history of transient ischemic attack (TIA), and cerebral infarction without residual deficits: Secondary | ICD-10-CM | POA: Diagnosis not present

## 2017-03-10 DIAGNOSIS — I5022 Chronic systolic (congestive) heart failure: Secondary | ICD-10-CM | POA: Diagnosis not present

## 2017-03-10 DIAGNOSIS — I110 Hypertensive heart disease with heart failure: Secondary | ICD-10-CM | POA: Diagnosis not present

## 2017-03-11 ENCOUNTER — Ambulatory Visit
Admission: RE | Admit: 2017-03-11 | Discharge: 2017-03-11 | Disposition: A | Discharge status: Home / Self Care | Payer: Medicare Other | Source: Ambulatory Visit | Attending: Radiation Oncology | Admitting: Radiation Oncology

## 2017-03-11 DIAGNOSIS — Z7982 Long term (current) use of aspirin: Secondary | ICD-10-CM | POA: Diagnosis not present

## 2017-03-11 DIAGNOSIS — Z833 Family history of diabetes mellitus: Secondary | ICD-10-CM | POA: Diagnosis not present

## 2017-03-11 DIAGNOSIS — I5022 Chronic systolic (congestive) heart failure: Secondary | ICD-10-CM | POA: Diagnosis not present

## 2017-03-11 DIAGNOSIS — C50412 Malignant neoplasm of upper-outer quadrant of left female breast: Secondary | ICD-10-CM | POA: Diagnosis not present

## 2017-03-11 DIAGNOSIS — Z801 Family history of malignant neoplasm of trachea, bronchus and lung: Secondary | ICD-10-CM | POA: Diagnosis not present

## 2017-03-11 DIAGNOSIS — J449 Chronic obstructive pulmonary disease, unspecified: Secondary | ICD-10-CM | POA: Diagnosis not present

## 2017-03-11 DIAGNOSIS — Z79899 Other long term (current) drug therapy: Secondary | ICD-10-CM | POA: Diagnosis not present

## 2017-03-11 DIAGNOSIS — Z170 Estrogen receptor positive status [ER+]: Principal | ICD-10-CM

## 2017-03-11 DIAGNOSIS — Z87891 Personal history of nicotine dependence: Secondary | ICD-10-CM | POA: Diagnosis not present

## 2017-03-11 DIAGNOSIS — Z8673 Personal history of transient ischemic attack (TIA), and cerebral infarction without residual deficits: Secondary | ICD-10-CM | POA: Diagnosis not present

## 2017-03-11 DIAGNOSIS — Z510 Encounter for antineoplastic radiation therapy: Secondary | ICD-10-CM | POA: Diagnosis not present

## 2017-03-11 DIAGNOSIS — I110 Hypertensive heart disease with heart failure: Secondary | ICD-10-CM | POA: Diagnosis not present

## 2017-03-11 DIAGNOSIS — Z9889 Other specified postprocedural states: Secondary | ICD-10-CM | POA: Diagnosis not present

## 2017-03-11 MED ORDER — RADIAPLEXRX EX GEL
Freq: Two times a day (BID) | CUTANEOUS | Status: DC
  Administered 2017-03-11: 16:00:00 via TOPICAL

## 2017-03-11 MED ORDER — ALRA NON-METALLIC DEODORANT (RAD-ONC)
1.0000 "application " | Freq: Once | TOPICAL | Status: AC
  Administered 2017-03-11: 1 via TOPICAL

## 2017-03-11 NOTE — Progress Notes (Signed)
Pt here for patient teaching.  Pt given Radiation and You booklet, skin care instructions, Alra deodorant and Radiaplex gel.  Reviewed areas of pertinence such as fatigue, skin changes, breast tenderness and breast swelling . Pt able to give teach back of to pat skin,apply Radiaplex bid, avoid applying anything to skin within 4 hours of treatment, avoid wearing an under wire bra and to use an electric razor if they must shave. Pt verbalizes understanding of information given and will contact nursing with any questions or concerns.     Http://rtanswers.org/treatmentinformation/whattoexpect/index

## 2017-03-14 ENCOUNTER — Ambulatory Visit
Admission: RE | Admit: 2017-03-14 | Discharge: 2017-03-14 | Disposition: A | Discharge status: Home / Self Care | Payer: Medicare Other | Source: Ambulatory Visit | Attending: Radiation Oncology | Admitting: Radiation Oncology

## 2017-03-14 DIAGNOSIS — Z833 Family history of diabetes mellitus: Secondary | ICD-10-CM | POA: Diagnosis not present

## 2017-03-14 DIAGNOSIS — Z87891 Personal history of nicotine dependence: Secondary | ICD-10-CM | POA: Diagnosis not present

## 2017-03-14 DIAGNOSIS — I5022 Chronic systolic (congestive) heart failure: Secondary | ICD-10-CM | POA: Diagnosis not present

## 2017-03-14 DIAGNOSIS — Z7982 Long term (current) use of aspirin: Secondary | ICD-10-CM | POA: Diagnosis not present

## 2017-03-14 DIAGNOSIS — Z801 Family history of malignant neoplasm of trachea, bronchus and lung: Secondary | ICD-10-CM | POA: Diagnosis not present

## 2017-03-14 DIAGNOSIS — Z79899 Other long term (current) drug therapy: Secondary | ICD-10-CM | POA: Diagnosis not present

## 2017-03-14 DIAGNOSIS — Z510 Encounter for antineoplastic radiation therapy: Secondary | ICD-10-CM | POA: Diagnosis not present

## 2017-03-14 DIAGNOSIS — Z8673 Personal history of transient ischemic attack (TIA), and cerebral infarction without residual deficits: Secondary | ICD-10-CM | POA: Diagnosis not present

## 2017-03-14 DIAGNOSIS — Z170 Estrogen receptor positive status [ER+]: Secondary | ICD-10-CM | POA: Diagnosis not present

## 2017-03-14 DIAGNOSIS — J449 Chronic obstructive pulmonary disease, unspecified: Secondary | ICD-10-CM | POA: Diagnosis not present

## 2017-03-14 DIAGNOSIS — Z9889 Other specified postprocedural states: Secondary | ICD-10-CM | POA: Diagnosis not present

## 2017-03-14 DIAGNOSIS — I110 Hypertensive heart disease with heart failure: Secondary | ICD-10-CM | POA: Diagnosis not present

## 2017-03-14 DIAGNOSIS — C50412 Malignant neoplasm of upper-outer quadrant of left female breast: Secondary | ICD-10-CM | POA: Diagnosis not present

## 2017-03-15 ENCOUNTER — Ambulatory Visit: Admission: RE | Admit: 2017-03-15 | Payer: Medicare Other | Source: Ambulatory Visit

## 2017-03-15 ENCOUNTER — Encounter (HOSPITAL_COMMUNITY): Payer: Self-pay

## 2017-03-15 ENCOUNTER — Ambulatory Visit: Payer: Medicare Other

## 2017-03-15 ENCOUNTER — Telehealth: Payer: Self-pay | Admitting: Radiation Oncology

## 2017-03-15 ENCOUNTER — Other Ambulatory Visit: Payer: Self-pay

## 2017-03-15 ENCOUNTER — Emergency Department (HOSPITAL_COMMUNITY)
Admission: EM | Admit: 2017-03-15 | Discharge: 2017-03-15 | Disposition: A | Discharge status: Home / Self Care | Payer: Medicare Other | Attending: Emergency Medicine | Admitting: Emergency Medicine

## 2017-03-15 ENCOUNTER — Emergency Department (HOSPITAL_BASED_OUTPATIENT_CLINIC_OR_DEPARTMENT_OTHER)
Admit: 2017-03-15 | Discharge: 2017-03-15 | Disposition: A | Discharge status: Home / Self Care | Payer: Medicare Other | Attending: Emergency Medicine | Admitting: Emergency Medicine

## 2017-03-15 DIAGNOSIS — J45909 Unspecified asthma, uncomplicated: Secondary | ICD-10-CM | POA: Insufficient documentation

## 2017-03-15 DIAGNOSIS — M79609 Pain in unspecified limb: Secondary | ICD-10-CM | POA: Diagnosis not present

## 2017-03-15 DIAGNOSIS — Z87891 Personal history of nicotine dependence: Secondary | ICD-10-CM | POA: Diagnosis not present

## 2017-03-15 DIAGNOSIS — Z7982 Long term (current) use of aspirin: Secondary | ICD-10-CM | POA: Insufficient documentation

## 2017-03-15 DIAGNOSIS — I110 Hypertensive heart disease with heart failure: Secondary | ICD-10-CM | POA: Diagnosis not present

## 2017-03-15 DIAGNOSIS — Z7901 Long term (current) use of anticoagulants: Secondary | ICD-10-CM | POA: Diagnosis not present

## 2017-03-15 DIAGNOSIS — Z79899 Other long term (current) drug therapy: Secondary | ICD-10-CM | POA: Diagnosis not present

## 2017-03-15 DIAGNOSIS — Z853 Personal history of malignant neoplasm of breast: Secondary | ICD-10-CM | POA: Diagnosis not present

## 2017-03-15 DIAGNOSIS — Z8673 Personal history of transient ischemic attack (TIA), and cerebral infarction without residual deficits: Secondary | ICD-10-CM | POA: Insufficient documentation

## 2017-03-15 DIAGNOSIS — M79662 Pain in left lower leg: Secondary | ICD-10-CM | POA: Diagnosis not present

## 2017-03-15 DIAGNOSIS — I5020 Unspecified systolic (congestive) heart failure: Secondary | ICD-10-CM | POA: Insufficient documentation

## 2017-03-15 DIAGNOSIS — M79605 Pain in left leg: Secondary | ICD-10-CM | POA: Diagnosis not present

## 2017-03-15 DIAGNOSIS — I2510 Atherosclerotic heart disease of native coronary artery without angina pectoris: Secondary | ICD-10-CM | POA: Diagnosis not present

## 2017-03-15 LAB — PROTIME-INR
INR: 1.97
Prothrombin Time: 22.3 seconds — ABNORMAL HIGH (ref 11.4–15.2)

## 2017-03-15 MED ORDER — OXYCODONE-ACETAMINOPHEN 5-325 MG PO TABS
2.0000 | ORAL_TABLET | Freq: Once | ORAL | Status: AC
  Administered 2017-03-15: 2 via ORAL
  Filled 2017-03-15: qty 2

## 2017-03-15 MED ORDER — OXYCODONE-ACETAMINOPHEN 5-325 MG PO TABS: 1.0000 | ORAL_TABLET | ORAL | 0 refills | Status: DC | PRN

## 2017-03-15 MED ORDER — OXYCODONE-ACETAMINOPHEN 5-325 MG PO TABS: 1.0000 | ORAL_TABLET | Freq: Once | ORAL | Status: DC

## 2017-03-15 NOTE — Progress Notes (Signed)
*  Preliminary Results* Left lower extremity venous duplex completed. Left lower extremity is negative for deep vein thrombosis. There is no evidence of left Baker's cyst.  03/15/2017 1:43 PM  [name], BS, RVT, RDCS, RDMS

## 2017-03-15 NOTE — ED Notes (Signed)
Attempted IV x 2 with success.

## 2017-03-15 NOTE — Discharge Instructions (Signed)
Your [place] does not show a DVT.

## 2017-03-15 NOTE — Telephone Encounter (Signed)
Patient receiving breast radiation. Patient phoned L2 this morning frantic that she was having difficulty moving her left leg below her knee. Phoned patient to inquire. She reports this symptom presented this morning. Stressed this difficulty with her leg is not related to radiation therapy to her breast. Per Dr. Ida Rogue order instructed patient to present to the emergency room for further evaluation. Patient verbalized understanding.

## 2017-03-15 NOTE — ED Provider Notes (Signed)
[name] Provider Note   CSN: [id number] Arrival date & time: 03/15/17  1017     History   Chief Complaint Chief Complaint  Patient presents with  . Leg Pain  . cancer patient    HPI Danielle [name] is a 72 y.o. female.  HPI   72 year old female with atraumatic left lower leg pain.  Pain is in the lateral to posterior aspect of the calf.  Constant.  Worse with movement.  No past history of DVT/PE but is being actively treated for breast cancer.  She has no acute respiratory complaints. She is on warfarin.   Past Medical History:  Diagnosis Date  . Allergy   . Anxiety   . Aortic stenosis    Status post bioprosthetic AVR  . Arthritis    DJD  . Asthma   . Breast cancer ([place]) 12/13/2016   Left breast  . Chronic systolic CHF (congestive heart failure) ([place])    EF 30% 04/2014  . COPD (chronic obstructive pulmonary disease) ([place])   . Coronary artery disease    per pt, had LHC prior to AVR in [place] that did not show any blockages; no stents/bypass  . Gastritis   . Hyperlipidemia   . Hypertension   . Hyperthyroidism   . Multiple thyroid nodules   . Osteoporosis   . Paroxysmal atrial fibrillation (HCC)   . Pelvis fracture ([place]) 08/19/2015   MULTIPLE   . Spontaneous pneumothorax 11/28/2015   left   . Stroke [facility]) 2000   rt hand weak    Patient Active Problem List   Diagnosis Date Noted  . Chronic pain disorder 01/25/2017  . Malignant neoplasm of upper-outer quadrant of left breast in female, estrogen receptor positive ([place]) 12/16/2016  . Chronic knee pain 12/07/2016  . Chronic bilateral low back pain without sciatica 12/07/2016  . Peripheral musculoskeletal gait disorder 12/07/2016  . Encounter for therapeutic drug monitoring 06/16/2016  . Generalized osteoarthritis of multiple sites 05/04/2016  . Chronic anticoagulation 05/04/2016  . Stroke ([place]) 04/14/2016  . Aortic valve disease 04/10/2016  . S/P AVR  04/10/2016  . NICM (nonischemic cardiomyopathy) ([place]) 04/10/2016  . Upper airway cough syndrome 03/14/2016  . Morbid obesity due to excess calories ([place]) 03/14/2016  . COPD GOLD 0  03/11/2016  . Thrombocytopenia ([place]) 12/12/2015  . Anemia 12/12/2015  . Vitamin D deficiency 12/12/2015  . Acute urinary retention 12/11/2015  . Osteoporosis 12/11/2015  . Hip fracture ([place]) 12/10/2015  . Aortic atherosclerosis ([place]) 12/10/2015  . Lung blebs ([place]) 12/03/2015  . Pelvic fracture ([place]) 08/19/2015  . Fall at home 08/19/2015  . Essential hypertension 08/19/2015  . Hyperlipidemia 08/19/2015  . Asthma 08/19/2015  . Atrial fibrillation ([place]) 08/19/2015  . Hyperthyroidism 08/19/2015  . Chronic systolic heart failure ([place])   . 3-vessel CAD   . Aortic stenosis     Past Surgical History:  Procedure Laterality Date  . BREAST LUMPECTOMY WITH RADIOACTIVE SEED AND SENTINEL LYMPH NODE BIOPSY Left 01/14/2017   Procedure: LEFT BREAST LUMPECTOMY WITH RADIOACTIVE SEED AND SENTINEL LYMPH NODE BIOPSY;  Surgeon: Rolm Bookbinder, MD;  Location: [facility];  Service: General;  Laterality: Left;  . CHEST TUBE INSERTION  10/2015  . INTRAMEDULLARY (IM) NAIL INTERTROCHANTERIC Left 12/10/2015   Procedure: INTRAMEDULLARY (IM) NAIL INTERTROCHANTRIC;  Surgeon: Leandrew Koyanagi, MD;  Location: [facility];  Service: Orthopedics;  Laterality: Left;  . PLEURADESIS Left 12/03/2015   Procedure: PLEURADESIS;  Surgeon: Melrose Nakayama, MD;  Location: [facility];  Service: Thoracic;  Laterality: Left;  . RESECTION OF APICAL BLEB Left 12/03/2015   Procedure: BLEBECTOMY;  Surgeon: Melrose Nakayama, MD;  Location: [facility];  Service: Thoracic;  Laterality: Left;  . THORACOSCOPY  12/03/2015  . VALVE REPLACEMENT    . VIDEO ASSISTED THORACOSCOPY Left 12/03/2015   Procedure: VIDEO ASSISTED THORACOSCOPY;  Surgeon: Melrose Nakayama, MD;  Location: [facility];  Service: Thoracic;  Laterality: Left;    OB History    No data available        Home Medications    Prior to Admission medications   Medication Sig Start Date End Date Taking? Authorizing Provider  acetaminophen (TYLENOL) 500 MG tablet Take 1,000 mg by mouth daily as needed (PAIN).   Yes [provider]  alendronate (FOSAMAX) 70 MG tablet Take 1 tablet (70 mg total) by mouth every Saturday. with a full glass of water on an empty stomach. 01/29/17  Yes [name], [name], MD  aspirin EC 81 MG tablet Take 81 mg daily by mouth.   Yes [provider]  atorvastatin (LIPITOR) 40 MG tablet Take 1 tablet (40 mg total) by mouth at bedtime. 06/04/16  Yes [name], [name], MD  budesonide-formoterol [facility]) 160-4.5 MCG/ACT inhaler Inhale 2 puffs into the lungs 2 (two) times daily. 03/11/16  Yes Tanda Rockers, MD  carvedilol (COREG) 25 MG tablet TAKE 1 TABLET BY MOUTH TWICE DAILY 01/31/17  Yes End, Harrell Gave, MD  Cholecalciferol (VITAMIN D3) 400 units tablet Take 1 tablet (400 Units total) by mouth daily. 12/16/15  Yes Ledell Noss, MD  diphenhydramine-acetaminophen (TYLENOL PM) 25-500 MG TABS tablet Take 2 tablets by mouth at bedtime as needed (SLEEP).    Yes [provider]  furosemide (LASIX) 20 MG tablet Take 1 tablet (20 mg total) by mouth daily as needed for fluid or edema. Patient taking differently: Take 20 mg daily by mouth.  05/20/16  Yes [name], [name], MD  gabapentin (NEURONTIN) 100 MG capsule Take 1 capsule (100 mg total) by mouth 3 (three) times daily. 03/08/17  Yes Kirsteins, Luanna Salk, MD  losartan (COZAAR) 25 MG tablet TAKE 1 TABLET BY MOUTH ONCE DAILY 10/04/16  Yes End, Harrell Gave, MD  methimazole (TAPAZOLE) 5 MG tablet Take 3 tablets (15 mg total) by mouth every evening. 02/23/17  Yes Renato Shin, MD  ranitidine (ZANTAC) 150 MG tablet Take 1 tablet (150 mg total) by mouth 2 (two) times daily. 05/20/16  Yes [name], [name], MD  senna-docusate (SENOKOT-S) 8.6-50 MG tablet Take 2 tablets at bedtime by mouth.    Yes [provider]   traMADol (ULTRAM) 50 MG tablet Take 1 tablet (50 mg total) by mouth 3 (three) times daily. 03/08/17  Yes Kirsteins, Luanna Salk, MD  valACYclovir (VALTREX) 1000 MG tablet Take 1 tablet (1,000 mg total) by mouth 2 (two) times daily. 03/04/17  Yes [name], [name], MD  warfarin (COUMADIN) 3 MG tablet Take as directed by anticoagulation clinic. Patient taking differently: Take 3 mg every evening by mouth. Take as directed by anticoagulation clinic. 10/04/16  Yes [name], [name], MD  ALPRAZolam Duanne Moron) 0.25 MG tablet Take 1 tablet (0.25 mg total) by mouth 3 (three) times daily as needed for anxiety or sleep. Patient not taking: Reported on 03/15/2017 12/27/16   [name], MD  diclofenac sodium (VOLTAREN) 1 % GEL Apply 4 g topically 4 (four) times daily. Patient not taking: Reported on 03/15/2017 06/07/16   [name], [name], MD  enoxaparin (LOVENOX) 100 MG/ML injection  Inject 1 mL (100 mg total) into the skin 2 (two) times daily for 7 doses. 01/25/17 01/29/17  [name], [name], MD  oxyCODONE (OXY IR/ROXICODONE) 5 MG immediate release tablet Take 1 tablet (5 mg total) every 6 (six) hours as needed by mouth for moderate pain, severe pain or breakthrough pain. Patient not taking: Reported on 03/15/2017 01/14/17   Rolm Bookbinder, MD    Family History Family History  Problem Relation Age of Onset  . Diabetes Mother   . Heart attack Mother 31  . Diabetes Father   . Lung cancer Father   . Diabetes Sister   . Thyroid disease Sister   . Diabetes Sister   . HIV Brother     Social History Social History   Tobacco Use  . Smoking status: Former Smoker    Packs/day: 0.25    Years: 10.00    Pack years: 2.50    Types: Cigarettes    Last attempt to quit: 03/01/2010    Years since quitting: 7.0  . Smokeless tobacco: Never Used  Substance Use Topics  . Alcohol use: No  . Drug use: No     Allergies   Lisinopril and Tetanus toxoid adsorbed   Review of Systems Review of Systems  All systems reviewed and  negative, other than as noted in HPI.  Physical Exam Updated Vital Signs BP 131/81   Pulse 76   Temp 98.2 F (36.8 C) (Oral)   Resp 18   Ht 5\' 7"  (1.702 m)   Wt 99.3 kg (219 lb)   SpO2 97%   BMI 34.30 kg/m   Physical Exam  Constitutional: She appears well-developed and well-nourished. No distress.  HENT:  Head: Normocephalic and atraumatic.  Eyes: Conjunctivae are normal. Right eye exhibits no discharge. Left eye exhibits no discharge.  Neck: Neck supple.  Cardiovascular: Normal rate, regular rhythm and normal heart sounds. Exam reveals no gallop and no friction rub.  No murmur heard. Pulmonary/Chest: Effort normal and breath sounds normal. No respiratory distress.  Abdominal: Soft. She exhibits no distension. There is no tenderness.  Musculoskeletal: She exhibits no edema or tenderness.  Body habitus limiting exam, but LE symmetric. TTP L calf and posiitive Homan's. No palpable cords. No overlying skin changes. Easily palpable DP pulse. Sensation intact to light touch.   Neurological: She is alert.  Skin: Skin is warm and dry.  Psychiatric: She has a normal mood and affect. Her behavior is normal. Thought content normal.  Nursing note and vitals reviewed.    ED Treatments / Results  Labs (all labs ordered are listed, but only abnormal results are displayed) Labs Reviewed  PROTIME-INR    EKG  EKG Interpretation None       Radiology No results found.  Procedures Procedures (including critical care time)  Medications Ordered in ED Medications  oxyCODONE-acetaminophen (PERCOCET/ROXICET) 5-325 MG per tablet 2 tablet (2 tablets Oral Given 03/15/17 1131)     Initial Impression / Assessment and Plan / ED Course  I have reviewed the triage vital signs and the nursing notes.  Pertinent labs & imaging results that were available during my care of the patient were reviewed by me and considered in my medical decision making (see chart for details).      72 year old female with atraumatic left calf pain.  Currently being treated for breast cancer.  I cannot say that I appreciate any swelling but she does have tenderness right calf and a positive Homans sign.  She is neurovascularly intact.  Will ultrasound eval for possible DVT.  No signs of infection.  Easily palpable DP pulse.  Final Clinical Impressions(s) / ED Diagnoses   Final diagnoses:  Left leg pain    ED Discharge Orders    None       Virgel Manifold, MD 03/15/17 1529

## 2017-03-15 NOTE — ED Triage Notes (Signed)
Patient is currently receiving radiation for breast cancer. Patient states she began having pain left latteral calf area today. Patient called a PA and was told to come to the ED to r/o blood clot.

## 2017-03-16 ENCOUNTER — Ambulatory Visit: Payer: Medicare Other

## 2017-03-16 ENCOUNTER — Ambulatory Visit (INDEPENDENT_AMBULATORY_CARE_PROVIDER_SITE_OTHER): Payer: Medicare Other | Admitting: General Practice

## 2017-03-16 ENCOUNTER — Ambulatory Visit
Admission: RE | Admit: 2017-03-16 | Discharge: 2017-03-16 | Disposition: A | Discharge status: Home / Self Care | Payer: Medicare Other | Source: Ambulatory Visit | Attending: Radiation Oncology | Admitting: Radiation Oncology

## 2017-03-16 ENCOUNTER — Telehealth: Payer: Self-pay | Admitting: Family Medicine

## 2017-03-16 DIAGNOSIS — C50412 Malignant neoplasm of upper-outer quadrant of left female breast: Secondary | ICD-10-CM | POA: Diagnosis not present

## 2017-03-16 DIAGNOSIS — Z9889 Other specified postprocedural states: Secondary | ICD-10-CM | POA: Diagnosis not present

## 2017-03-16 DIAGNOSIS — Z7982 Long term (current) use of aspirin: Secondary | ICD-10-CM | POA: Diagnosis not present

## 2017-03-16 DIAGNOSIS — Z7901 Long term (current) use of anticoagulants: Secondary | ICD-10-CM | POA: Diagnosis not present

## 2017-03-16 DIAGNOSIS — Z79899 Other long term (current) drug therapy: Secondary | ICD-10-CM | POA: Diagnosis not present

## 2017-03-16 DIAGNOSIS — Z87891 Personal history of nicotine dependence: Secondary | ICD-10-CM | POA: Diagnosis not present

## 2017-03-16 DIAGNOSIS — Z510 Encounter for antineoplastic radiation therapy: Secondary | ICD-10-CM | POA: Diagnosis not present

## 2017-03-16 DIAGNOSIS — Z170 Estrogen receptor positive status [ER+]: Secondary | ICD-10-CM | POA: Diagnosis not present

## 2017-03-16 DIAGNOSIS — Z801 Family history of malignant neoplasm of trachea, bronchus and lung: Secondary | ICD-10-CM | POA: Diagnosis not present

## 2017-03-16 DIAGNOSIS — Z8673 Personal history of transient ischemic attack (TIA), and cerebral infarction without residual deficits: Secondary | ICD-10-CM | POA: Diagnosis not present

## 2017-03-16 DIAGNOSIS — I110 Hypertensive heart disease with heart failure: Secondary | ICD-10-CM | POA: Diagnosis not present

## 2017-03-16 DIAGNOSIS — J449 Chronic obstructive pulmonary disease, unspecified: Secondary | ICD-10-CM | POA: Diagnosis not present

## 2017-03-16 DIAGNOSIS — Z833 Family history of diabetes mellitus: Secondary | ICD-10-CM | POA: Diagnosis not present

## 2017-03-16 DIAGNOSIS — I5022 Chronic systolic (congestive) heart failure: Secondary | ICD-10-CM | POA: Diagnosis not present

## 2017-03-16 NOTE — Telephone Encounter (Signed)
Copied from [place] [id number]. Topic: Quick Communication - Appointment Cancellation >> Mar 16, 2017  2:59 PM Ivar Drape wrote: Patient called to cancel appointment scheduled for 03/16/17. Patient has not rescheduled their appointment. Patient went to [facility] on Tuesday, 03/15/17 and they checked her Coumadin levels and it was 1.9    Route to department's PEC pool.

## 2017-03-16 NOTE — Patient Instructions (Addendum)
Pre visit review using our clinic review tool, if applicable. No additional management support is needed unless otherwise documented below in the visit note.  Take extra 1/2 tablet today and then take 1 tablet daily except 1 1/2 tablets on Wednesdays.  Re-check in 4 weeks. (Use only 3 mg tablets only).

## 2017-03-16 NOTE — Telephone Encounter (Signed)
Noted  

## 2017-03-17 ENCOUNTER — Ambulatory Visit: Payer: Medicare Other | Admitting: Internal Medicine

## 2017-03-17 ENCOUNTER — Ambulatory Visit
Admission: RE | Admit: 2017-03-17 | Discharge: 2017-03-17 | Disposition: A | Discharge status: Home / Self Care | Payer: Medicare Other | Source: Ambulatory Visit | Attending: Radiation Oncology | Admitting: Radiation Oncology

## 2017-03-17 ENCOUNTER — Telehealth: Payer: Self-pay | Admitting: Family Medicine

## 2017-03-17 DIAGNOSIS — J449 Chronic obstructive pulmonary disease, unspecified: Secondary | ICD-10-CM | POA: Diagnosis not present

## 2017-03-17 DIAGNOSIS — Z801 Family history of malignant neoplasm of trachea, bronchus and lung: Secondary | ICD-10-CM | POA: Diagnosis not present

## 2017-03-17 DIAGNOSIS — C50412 Malignant neoplasm of upper-outer quadrant of left female breast: Secondary | ICD-10-CM | POA: Diagnosis not present

## 2017-03-17 DIAGNOSIS — Z170 Estrogen receptor positive status [ER+]: Secondary | ICD-10-CM | POA: Diagnosis not present

## 2017-03-17 DIAGNOSIS — Z8673 Personal history of transient ischemic attack (TIA), and cerebral infarction without residual deficits: Secondary | ICD-10-CM | POA: Diagnosis not present

## 2017-03-17 DIAGNOSIS — Z833 Family history of diabetes mellitus: Secondary | ICD-10-CM | POA: Diagnosis not present

## 2017-03-17 DIAGNOSIS — I5022 Chronic systolic (congestive) heart failure: Secondary | ICD-10-CM | POA: Diagnosis not present

## 2017-03-17 DIAGNOSIS — Z7982 Long term (current) use of aspirin: Secondary | ICD-10-CM | POA: Diagnosis not present

## 2017-03-17 DIAGNOSIS — Z9889 Other specified postprocedural states: Secondary | ICD-10-CM | POA: Diagnosis not present

## 2017-03-17 DIAGNOSIS — I110 Hypertensive heart disease with heart failure: Secondary | ICD-10-CM | POA: Diagnosis not present

## 2017-03-17 DIAGNOSIS — Z79899 Other long term (current) drug therapy: Secondary | ICD-10-CM | POA: Diagnosis not present

## 2017-03-17 DIAGNOSIS — Z510 Encounter for antineoplastic radiation therapy: Secondary | ICD-10-CM | POA: Diagnosis not present

## 2017-03-17 DIAGNOSIS — Z87891 Personal history of nicotine dependence: Secondary | ICD-10-CM | POA: Diagnosis not present

## 2017-03-17 NOTE — Telephone Encounter (Signed)
Copied from [place] [id number]. Topic: Quick Communication - See Telephone Encounter >> Mar 17, 2017 12:26 PM Danielle [name], NT wrote: CRM for notification. See Telephone encounter for:  03/17/17.  Patient is needing clarification if she is supposed to stay on the 3mg  tablet of Coumadin. Please advise and contact patient.

## 2017-03-17 NOTE — Telephone Encounter (Signed)
Patient has been contacted and new dosing instructions given.  Patient verbalizes understanding.

## 2017-03-18 ENCOUNTER — Ambulatory Visit
Admission: RE | Admit: 2017-03-18 | Discharge: 2017-03-18 | Disposition: A | Discharge status: Home / Self Care | Payer: Medicare Other | Source: Ambulatory Visit | Attending: Radiation Oncology | Admitting: Radiation Oncology

## 2017-03-18 ENCOUNTER — Other Ambulatory Visit: Payer: Self-pay | Admitting: Licensed Clinical Social Worker

## 2017-03-18 DIAGNOSIS — C50412 Malignant neoplasm of upper-outer quadrant of left female breast: Secondary | ICD-10-CM | POA: Diagnosis not present

## 2017-03-18 DIAGNOSIS — I5022 Chronic systolic (congestive) heart failure: Secondary | ICD-10-CM | POA: Diagnosis not present

## 2017-03-18 DIAGNOSIS — Z833 Family history of diabetes mellitus: Secondary | ICD-10-CM | POA: Diagnosis not present

## 2017-03-18 DIAGNOSIS — Z8673 Personal history of transient ischemic attack (TIA), and cerebral infarction without residual deficits: Secondary | ICD-10-CM | POA: Diagnosis not present

## 2017-03-18 DIAGNOSIS — Z87891 Personal history of nicotine dependence: Secondary | ICD-10-CM | POA: Diagnosis not present

## 2017-03-18 DIAGNOSIS — I110 Hypertensive heart disease with heart failure: Secondary | ICD-10-CM | POA: Diagnosis not present

## 2017-03-18 DIAGNOSIS — Z801 Family history of malignant neoplasm of trachea, bronchus and lung: Secondary | ICD-10-CM | POA: Diagnosis not present

## 2017-03-18 DIAGNOSIS — Z170 Estrogen receptor positive status [ER+]: Secondary | ICD-10-CM | POA: Diagnosis not present

## 2017-03-18 DIAGNOSIS — Z9889 Other specified postprocedural states: Secondary | ICD-10-CM | POA: Diagnosis not present

## 2017-03-18 DIAGNOSIS — Z7982 Long term (current) use of aspirin: Secondary | ICD-10-CM | POA: Diagnosis not present

## 2017-03-18 DIAGNOSIS — Z79899 Other long term (current) drug therapy: Secondary | ICD-10-CM | POA: Diagnosis not present

## 2017-03-18 DIAGNOSIS — J449 Chronic obstructive pulmonary disease, unspecified: Secondary | ICD-10-CM | POA: Diagnosis not present

## 2017-03-18 DIAGNOSIS — Z510 Encounter for antineoplastic radiation therapy: Secondary | ICD-10-CM | POA: Diagnosis not present

## 2017-03-18 NOTE — Patient Outreach (Signed)
[facility]) Care Management  03/18/2017  Zynia Wojtowicz 1945-06-09 [id number]  Assessment- CSW completed outreach call to patient to follow up on recent ED visit. Patient arrived at ED on 03/15/17 with atraumatic left calf pain. Patient reports that her pain has improved since then. Patient states that she had her SCAT assessment appointment yesterday but when she called to clarify appointment, they informed her that no appointment had been scheduled. SCAT assessment appointment scheduled for 03/24/17. Patient reports that she is getting practice now walking down her one step to get outside of her house and on the [place]. Motivational interviewing intervention and emotional support provided during phone call today that patient was receptive to.   Plan-CSW will follow up within one week to ensure that stable transportation was successfully gained.  [name], BSW, MSW, [email or web address] Phone: [phone] Fax: [phone]

## 2017-03-21 ENCOUNTER — Encounter: Payer: Self-pay | Admitting: *Deleted

## 2017-03-21 ENCOUNTER — Ambulatory Visit
Admission: RE | Admit: 2017-03-21 | Discharge: 2017-03-21 | Disposition: A | Discharge status: Home / Self Care | Payer: Medicare Other | Source: Ambulatory Visit | Attending: Radiation Oncology | Admitting: Radiation Oncology

## 2017-03-21 ENCOUNTER — Other Ambulatory Visit: Payer: Self-pay | Admitting: *Deleted

## 2017-03-21 DIAGNOSIS — I110 Hypertensive heart disease with heart failure: Secondary | ICD-10-CM | POA: Diagnosis not present

## 2017-03-21 DIAGNOSIS — Z79899 Other long term (current) drug therapy: Secondary | ICD-10-CM | POA: Diagnosis not present

## 2017-03-21 DIAGNOSIS — J449 Chronic obstructive pulmonary disease, unspecified: Secondary | ICD-10-CM | POA: Diagnosis not present

## 2017-03-21 DIAGNOSIS — C50412 Malignant neoplasm of upper-outer quadrant of left female breast: Secondary | ICD-10-CM | POA: Diagnosis not present

## 2017-03-21 DIAGNOSIS — Z833 Family history of diabetes mellitus: Secondary | ICD-10-CM | POA: Diagnosis not present

## 2017-03-21 DIAGNOSIS — Z87891 Personal history of nicotine dependence: Secondary | ICD-10-CM | POA: Diagnosis not present

## 2017-03-21 DIAGNOSIS — I5022 Chronic systolic (congestive) heart failure: Secondary | ICD-10-CM | POA: Diagnosis not present

## 2017-03-21 DIAGNOSIS — Z9889 Other specified postprocedural states: Secondary | ICD-10-CM | POA: Diagnosis not present

## 2017-03-21 DIAGNOSIS — Z510 Encounter for antineoplastic radiation therapy: Secondary | ICD-10-CM | POA: Diagnosis not present

## 2017-03-21 DIAGNOSIS — Z170 Estrogen receptor positive status [ER+]: Secondary | ICD-10-CM | POA: Diagnosis not present

## 2017-03-21 DIAGNOSIS — Z8673 Personal history of transient ischemic attack (TIA), and cerebral infarction without residual deficits: Secondary | ICD-10-CM | POA: Diagnosis not present

## 2017-03-21 DIAGNOSIS — Z7982 Long term (current) use of aspirin: Secondary | ICD-10-CM | POA: Diagnosis not present

## 2017-03-21 DIAGNOSIS — Z801 Family history of malignant neoplasm of trachea, bronchus and lung: Secondary | ICD-10-CM | POA: Diagnosis not present

## 2017-03-21 NOTE — Patient Outreach (Signed)
[facility]) Care Management [place] CCM Nursing program case closure  03/21/2017  Florentine Diekman 1945-06-16 [id number]  10:05 am:  Successful telephone outreach to Morven Pulliam-McEacheanis a 72 y.o.femaleoriginally referred to [facility] CM by EMMIprevent; [facility] telephonic RN CM sent referral to [facility] for further evaluation/ assessment of care needs at home.Patient has history including, but not limited to, HTN/ HLD; Atrial Fibrillation on chronic anticoagulation therapy (ACT); sCHF, previous CVA x 2; CAD withpreviousCABGand AVR; and (L) hip fracture in 2017. Patient was recently diagnosed with (L) breast cancer and underwent lumpectomy on January 14, 2017. HIPAA/ identity verified with patient during phone call today.  Patient requested that I re-attempt phone call later this morning, as she is currently visiting with her pastor; agreed to re-attempt call in 30-60 minutes.  11:00 am:  Returned patient's call; unsuccessful call re-attempt; HIPAA compliant voice mail message left for patient, requesting return call back.  12:45 pm:  Patient returned my calls from earlier today; HIPAA/ identity again verified with patient during phone call  Today, again reports that she "is doing real good;" reports had a "great visit" with her pastor this morning.  Patient denies pain, concerns, or issues today and sounds to be in no apparent/ obvious distress throughout today's phone call.  Patient further reports:  --No new falls; continues using walker primarily indoors at home; occasionally uses wheelchair during times of increased pain, or when she goes out  -- ED visit 03/15/16 for leg pain; patient ruled out for DVT; states leg "occasionally a little sore," denies ongoing pain/ concerns  -- Oncology radiation treatments continue "every day, Monday- Friday."  Reports sessions "going great; they only last for a very few  minutes;" reports that she has adjusted well to new radiation treatments and "is exactly half way through" her sessions-- states, "2 weeks behind me, 2 weeks to go."  Reports occasional "nipple pain at night" in breast after having her radiation treatments; verbalizes plans to talk to nurse at cancer center this afternoon to report; positive reinforcement provided, and patient was encouraged to maintain close contact with care providers, and to always ask about anything that is out of the ordinary around her clinical status, which she agrees to do.  --continues working with [facility] CSW re: transportation options; verbalizes plans to re-schedule SCAT interview "on Thursday of this week." Reports she believes she will use SCAT "a lot" after radiation treatments are completed, as outpatient PT post- radiation do not have any available appointments in the afternoon when her husband can transport her around his work schedule.  -- has and is taking all medications; denies concerns/ questions around medications today  -- Patient denies further questions around AD planning; states she "has them all ready," but has not been able to have documents notarized due to daily radiation treatments; confirmed with patient that she has no further questions around finalizing the AD's she has created, and encouraged her to complete the final step with notarization of documents promptly; shared with patient that she may be able to ask chaplain at [facility] for assistance in finalizing documents.  Today, we again discussed possibility of [facility] CCM case closure, as patient has clearly met all of her previously established [facility] CM goals; patient declines further [facility] CM care coordination needs and verbalizes agreement with [facility] RN CCM case closure.  Patient denies further issues, concerns, or problems today. I confirmed that patient hasmy direct phone number, the main [facility] CM  office phone number, and the [facility] CM 24-hour nurse advice  phone number should issues arise in the future.  Plan:  Will close [place] program and make [facility] CSW, (still active), [facility] CMA, and patient's PCP aware of [facility] RN CCM case closure, as patient has successfully met her previously established [facility] RN CM goals and voices no further care coordination needs   [facility] CM Care Plan Problem One     Most Recent Value  Care Plan Problem One  Patient uncertainty with plan of care around new medical diagnosis, as evidenced by patient reporting of same  Role Documenting the Problem One  Care Management [place] for Problem One  Not Active  [facility] Long Term Goal   Over the next 60 days, patient will be able to verbalize general plan of care around new diagnosis of breast cancer, as evidenced by patient reporting of same during [facility] RN CM outreach  [facility] Long Term Goal Start Date  01/17/17  [facility] Long Term Goal Met Date  03/21/17 -- Goal met    [facility] CM Care Plan Problem Two     Most Recent Value  Care Plan Problem Two  Need for Advanced Directives to be created, as evidenced by patient reporting of same  Role Documenting the Problem Two  Care Management [place] for Problem Two  Not Active  [facility] Long Term Goal  Over the next 60 days, patient will have Advanced Directives for HCPOA and living will in place, as evidenced by patient reporting of same  [place] Term Goal Start Date  01/17/17  [facility] Long Term Goal Met Date  03/21/17-- Goal partially met; AD's created, need to be notarized     It has been a pleasure participating in [place] care,  Oneta Rack, RN, BSN, [facility] Care Management  [phone]

## 2017-03-22 ENCOUNTER — Ambulatory Visit
Admission: RE | Admit: 2017-03-22 | Discharge: 2017-03-22 | Disposition: A | Discharge status: Home / Self Care | Payer: Medicare Other | Source: Ambulatory Visit | Attending: Radiation Oncology | Admitting: Radiation Oncology

## 2017-03-22 DIAGNOSIS — C50912 Malignant neoplasm of unspecified site of left female breast: Secondary | ICD-10-CM | POA: Diagnosis not present

## 2017-03-22 DIAGNOSIS — C50412 Malignant neoplasm of upper-outer quadrant of left female breast: Secondary | ICD-10-CM | POA: Diagnosis not present

## 2017-03-22 DIAGNOSIS — Z510 Encounter for antineoplastic radiation therapy: Secondary | ICD-10-CM | POA: Diagnosis not present

## 2017-03-22 DIAGNOSIS — Z170 Estrogen receptor positive status [ER+]: Secondary | ICD-10-CM | POA: Diagnosis not present

## 2017-03-22 NOTE — Progress Notes (Signed)
I have reviewed documentation from this visit and I agree with recommendations given.  [name]. [name], MD  [facility]. [place] office.   

## 2017-03-23 ENCOUNTER — Ambulatory Visit
Admission: RE | Admit: 2017-03-23 | Discharge: 2017-03-23 | Disposition: A | Discharge status: Home / Self Care | Payer: Medicare Other | Source: Ambulatory Visit | Attending: Radiation Oncology | Admitting: Radiation Oncology

## 2017-03-23 DIAGNOSIS — C50912 Malignant neoplasm of unspecified site of left female breast: Secondary | ICD-10-CM | POA: Diagnosis not present

## 2017-03-23 DIAGNOSIS — Z170 Estrogen receptor positive status [ER+]: Secondary | ICD-10-CM | POA: Diagnosis not present

## 2017-03-23 DIAGNOSIS — Z510 Encounter for antineoplastic radiation therapy: Secondary | ICD-10-CM | POA: Diagnosis not present

## 2017-03-23 DIAGNOSIS — C50412 Malignant neoplasm of upper-outer quadrant of left female breast: Secondary | ICD-10-CM | POA: Diagnosis not present

## 2017-03-24 ENCOUNTER — Ambulatory Visit
Admission: RE | Admit: 2017-03-24 | Discharge: 2017-03-24 | Disposition: A | Discharge status: Home / Self Care | Payer: Medicare Other | Source: Ambulatory Visit | Attending: Radiation Oncology | Admitting: Radiation Oncology

## 2017-03-24 DIAGNOSIS — Z170 Estrogen receptor positive status [ER+]: Secondary | ICD-10-CM | POA: Diagnosis not present

## 2017-03-24 DIAGNOSIS — C50912 Malignant neoplasm of unspecified site of left female breast: Secondary | ICD-10-CM | POA: Diagnosis not present

## 2017-03-24 DIAGNOSIS — C50412 Malignant neoplasm of upper-outer quadrant of left female breast: Secondary | ICD-10-CM | POA: Diagnosis not present

## 2017-03-24 DIAGNOSIS — Z510 Encounter for antineoplastic radiation therapy: Secondary | ICD-10-CM | POA: Diagnosis not present

## 2017-03-25 ENCOUNTER — Ambulatory Visit
Admission: RE | Admit: 2017-03-25 | Discharge: 2017-03-25 | Disposition: A | Discharge status: Home / Self Care | Payer: Medicare Other | Source: Ambulatory Visit | Attending: Radiation Oncology | Admitting: Radiation Oncology

## 2017-03-25 ENCOUNTER — Ambulatory Visit: Payer: Medicare Other | Admitting: Radiation Oncology

## 2017-03-25 DIAGNOSIS — C50912 Malignant neoplasm of unspecified site of left female breast: Secondary | ICD-10-CM | POA: Diagnosis not present

## 2017-03-25 DIAGNOSIS — Z510 Encounter for antineoplastic radiation therapy: Secondary | ICD-10-CM | POA: Diagnosis not present

## 2017-03-25 DIAGNOSIS — Z170 Estrogen receptor positive status [ER+]: Secondary | ICD-10-CM | POA: Diagnosis not present

## 2017-03-25 DIAGNOSIS — C50412 Malignant neoplasm of upper-outer quadrant of left female breast: Secondary | ICD-10-CM | POA: Diagnosis not present

## 2017-03-28 ENCOUNTER — Ambulatory Visit: Payer: Medicare Other

## 2017-03-28 ENCOUNTER — Ambulatory Visit
Admission: RE | Admit: 2017-03-28 | Discharge: 2017-03-28 | Disposition: A | Discharge status: Home / Self Care | Payer: Medicare Other | Source: Ambulatory Visit | Attending: Radiation Oncology | Admitting: Radiation Oncology

## 2017-03-28 DIAGNOSIS — C50412 Malignant neoplasm of upper-outer quadrant of left female breast: Secondary | ICD-10-CM | POA: Diagnosis not present

## 2017-03-28 DIAGNOSIS — C50912 Malignant neoplasm of unspecified site of left female breast: Secondary | ICD-10-CM | POA: Diagnosis not present

## 2017-03-28 DIAGNOSIS — Z510 Encounter for antineoplastic radiation therapy: Secondary | ICD-10-CM | POA: Diagnosis not present

## 2017-03-28 DIAGNOSIS — Z170 Estrogen receptor positive status [ER+]: Secondary | ICD-10-CM | POA: Diagnosis not present

## 2017-03-29 ENCOUNTER — Other Ambulatory Visit: Payer: Self-pay | Admitting: Licensed Clinical Social Worker

## 2017-03-29 ENCOUNTER — Ambulatory Visit: Payer: Medicare Other | Admitting: Physical Medicine & Rehabilitation

## 2017-03-29 ENCOUNTER — Telehealth: Payer: Self-pay | Admitting: Family Medicine

## 2017-03-29 ENCOUNTER — Other Ambulatory Visit: Payer: Self-pay | Admitting: Endocrinology

## 2017-03-29 ENCOUNTER — Ambulatory Visit
Admission: RE | Admit: 2017-03-29 | Discharge: 2017-03-29 | Disposition: A | Discharge status: Home / Self Care | Payer: Medicare Other | Source: Ambulatory Visit | Attending: Radiation Oncology | Admitting: Radiation Oncology

## 2017-03-29 ENCOUNTER — Other Ambulatory Visit: Payer: Self-pay | Admitting: Family Medicine

## 2017-03-29 DIAGNOSIS — C50912 Malignant neoplasm of unspecified site of left female breast: Secondary | ICD-10-CM | POA: Diagnosis not present

## 2017-03-29 DIAGNOSIS — Z170 Estrogen receptor positive status [ER+]: Secondary | ICD-10-CM | POA: Diagnosis not present

## 2017-03-29 DIAGNOSIS — C50412 Malignant neoplasm of upper-outer quadrant of left female breast: Secondary | ICD-10-CM | POA: Diagnosis not present

## 2017-03-29 DIAGNOSIS — Z510 Encounter for antineoplastic radiation therapy: Secondary | ICD-10-CM | POA: Diagnosis not present

## 2017-03-29 MED ORDER — ATORVASTATIN CALCIUM 40 MG PO TABS: 40.0000 mg | ORAL_TABLET | Freq: Every day | ORAL | 2 refills | Status: DC

## 2017-03-29 NOTE — Telephone Encounter (Signed)
Pt needs script sent over to pharmacy,    methimazole (TAPAZOLE) 5 MG tablet    [place], [place] - [address]

## 2017-03-29 NOTE — Telephone Encounter (Signed)
Pt was called to schedule an appointment with her pcp in Feb. She is requesting a refill on her atorvastatin.  LOV 01/25/17 NOV 04/13/17 prescription  refilled for 1 month.

## 2017-03-29 NOTE — Patient Outreach (Signed)
[facility]) Care Management  03/29/2017  Danielle [name] 12-07-1945 [id number]  Assessment- CSW received incoming call from patient on 03/29/17. Patient wanted to express her gratitude for CSW's assistance with helping her gain stable transportation. Patient went to her SCAT in person interview appointment and was approved for services. Patient confirms to have her SCAT ID badge. Patient was educated on how to make reservations through SCAT. Patient denies any other social work needs at this time. Patient is agreeable to case closure as all care management goals have been met. Patient was encouraged to contact [place] again if she has any concerns in the future.  Plan-CSW will complete case closure and will notify PCP and [place] Management Assistant.  [name], BSW, MSW, [email or web address] Phone: [phone] Fax: [phone]

## 2017-03-29 NOTE — Telephone Encounter (Signed)
Copied from [place] [id number]. Topic: Quick Communication - Rx Refill/Question >> Mar 29, 2017  9:50 AM Antonieta Iba C wrote: Medication: atorvastatin   Has the patient contacted their pharmacy? No    (Agent: If no, request that the patient contact the pharmacy for the refill.)   Preferred Pharmacy (with phone number or street name): [place], [place] - [address]   Agent: Please be advised that RX refills may take up to 3 business days. We ask that you follow-up with your pharmacy.

## 2017-03-30 ENCOUNTER — Ambulatory Visit: Payer: Medicare Other

## 2017-03-30 ENCOUNTER — Ambulatory Visit
Admission: RE | Admit: 2017-03-30 | Discharge: 2017-03-30 | Disposition: A | Discharge status: Home / Self Care | Payer: Medicare Other | Source: Ambulatory Visit | Attending: Radiation Oncology | Admitting: Radiation Oncology

## 2017-03-30 ENCOUNTER — Encounter: Payer: Self-pay | Admitting: General Practice

## 2017-03-30 DIAGNOSIS — Z510 Encounter for antineoplastic radiation therapy: Secondary | ICD-10-CM | POA: Diagnosis not present

## 2017-03-30 DIAGNOSIS — C50912 Malignant neoplasm of unspecified site of left female breast: Secondary | ICD-10-CM | POA: Diagnosis not present

## 2017-03-30 DIAGNOSIS — Z170 Estrogen receptor positive status [ER+]: Secondary | ICD-10-CM | POA: Diagnosis not present

## 2017-03-30 DIAGNOSIS — C50412 Malignant neoplasm of upper-outer quadrant of left female breast: Secondary | ICD-10-CM | POA: Diagnosis not present

## 2017-03-30 NOTE — Progress Notes (Signed)
[place] CSW Progress Notes  Received referral from Shona Simpson asking CSW to assess patient needs for transportation - per message, pt had cancelled appt on 1/28 due to no transportation.  Unable to reach patient, left VM requesting call back to discuss needs and resources.  Edwyna Shell, LCSW Clinical Social Worker Phone:  [phone]

## 2017-03-31 ENCOUNTER — Ambulatory Visit: Payer: Medicare Other

## 2017-03-31 ENCOUNTER — Ambulatory Visit
Admission: RE | Admit: 2017-03-31 | Discharge: 2017-03-31 | Disposition: A | Discharge status: Home / Self Care | Payer: Medicare Other | Source: Ambulatory Visit | Attending: Radiation Oncology | Admitting: Radiation Oncology

## 2017-03-31 DIAGNOSIS — C50412 Malignant neoplasm of upper-outer quadrant of left female breast: Secondary | ICD-10-CM | POA: Diagnosis not present

## 2017-03-31 DIAGNOSIS — Z510 Encounter for antineoplastic radiation therapy: Secondary | ICD-10-CM | POA: Diagnosis not present

## 2017-03-31 DIAGNOSIS — Z170 Estrogen receptor positive status [ER+]: Secondary | ICD-10-CM | POA: Diagnosis not present

## 2017-03-31 DIAGNOSIS — C50912 Malignant neoplasm of unspecified site of left female breast: Secondary | ICD-10-CM | POA: Diagnosis not present

## 2017-04-01 ENCOUNTER — Ambulatory Visit: Payer: Medicare Other | Admitting: Internal Medicine

## 2017-04-01 ENCOUNTER — Ambulatory Visit
Admission: RE | Admit: 2017-04-01 | Discharge: 2017-04-01 | Disposition: A | Discharge status: Home / Self Care | Payer: Medicare Other | Source: Ambulatory Visit | Attending: Radiation Oncology | Admitting: Radiation Oncology

## 2017-04-01 ENCOUNTER — Ambulatory Visit: Payer: Medicare Other

## 2017-04-01 DIAGNOSIS — C50912 Malignant neoplasm of unspecified site of left female breast: Secondary | ICD-10-CM | POA: Diagnosis not present

## 2017-04-01 DIAGNOSIS — Z510 Encounter for antineoplastic radiation therapy: Secondary | ICD-10-CM | POA: Diagnosis not present

## 2017-04-01 DIAGNOSIS — Z170 Estrogen receptor positive status [ER+]: Secondary | ICD-10-CM | POA: Diagnosis not present

## 2017-04-01 DIAGNOSIS — C50412 Malignant neoplasm of upper-outer quadrant of left female breast: Secondary | ICD-10-CM | POA: Diagnosis not present

## 2017-04-01 NOTE — Progress Notes (Signed)
[facility] care discussed to continue use of Radiplex gel for two more weeks; then start using a lotion with vitamin E until you come back to see your Radiation oncologist for your follow up appointment.  Asked if she has an appointment scheduled to see her medical oncologist.  Let her know that the survivorship [place]   will meet with her and she will receive a call from a scheduler to set up the appointment.  Told she will receive some reading material on survivorship,FYNN and [employer] during her survivorship appointment.  Given a one month follow up appointment card to see  Shona Simpson, P.A.  She was able to verbalize understanding of the instructions and will call if there are any nursing or medical concerns or questions.

## 2017-04-04 ENCOUNTER — Ambulatory Visit: Payer: Medicare Other

## 2017-04-04 ENCOUNTER — Ambulatory Visit
Admission: RE | Admit: 2017-04-04 | Discharge: 2017-04-04 | Disposition: A | Discharge status: Home / Self Care | Payer: Medicare Other | Source: Ambulatory Visit | Attending: Radiation Oncology | Admitting: Radiation Oncology

## 2017-04-04 DIAGNOSIS — C50412 Malignant neoplasm of upper-outer quadrant of left female breast: Secondary | ICD-10-CM | POA: Diagnosis not present

## 2017-04-04 DIAGNOSIS — Z170 Estrogen receptor positive status [ER+]: Secondary | ICD-10-CM | POA: Diagnosis not present

## 2017-04-04 DIAGNOSIS — C50912 Malignant neoplasm of unspecified site of left female breast: Secondary | ICD-10-CM | POA: Diagnosis not present

## 2017-04-04 DIAGNOSIS — Z510 Encounter for antineoplastic radiation therapy: Secondary | ICD-10-CM | POA: Diagnosis not present

## 2017-04-05 ENCOUNTER — Encounter: Payer: Self-pay | Admitting: Radiation Oncology

## 2017-04-05 ENCOUNTER — Ambulatory Visit: Payer: Medicare Other

## 2017-04-05 ENCOUNTER — Ambulatory Visit
Admission: RE | Admit: 2017-04-05 | Discharge: 2017-04-05 | Disposition: A | Discharge status: Home / Self Care | Payer: Medicare Other | Source: Ambulatory Visit | Attending: Radiation Oncology | Admitting: Radiation Oncology

## 2017-04-05 DIAGNOSIS — C50412 Malignant neoplasm of upper-outer quadrant of left female breast: Secondary | ICD-10-CM | POA: Diagnosis not present

## 2017-04-05 DIAGNOSIS — C50912 Malignant neoplasm of unspecified site of left female breast: Secondary | ICD-10-CM | POA: Diagnosis not present

## 2017-04-05 DIAGNOSIS — Z170 Estrogen receptor positive status [ER+]: Secondary | ICD-10-CM | POA: Diagnosis not present

## 2017-04-05 DIAGNOSIS — Z510 Encounter for antineoplastic radiation therapy: Secondary | ICD-10-CM | POA: Diagnosis not present

## 2017-04-06 ENCOUNTER — Ambulatory Visit: Payer: Medicare Other

## 2017-04-06 ENCOUNTER — Ambulatory Visit
Admission: RE | Admit: 2017-04-06 | Discharge: 2017-04-06 | Disposition: A | Discharge status: Home / Self Care | Payer: Medicare Other | Source: Ambulatory Visit | Attending: Physical Medicine & Rehabilitation | Admitting: Physical Medicine & Rehabilitation

## 2017-04-06 DIAGNOSIS — M48061 Spinal stenosis, lumbar region without neurogenic claudication: Secondary | ICD-10-CM | POA: Diagnosis not present

## 2017-04-06 DIAGNOSIS — G8929 Other chronic pain: Secondary | ICD-10-CM

## 2017-04-06 DIAGNOSIS — M5442 Lumbago with sciatica, left side: Principal | ICD-10-CM

## 2017-04-07 NOTE — Progress Notes (Signed)
  Radiation Oncology         [phone]) [phone] ________________________________  Name: Danielle [name] MRN: [id number]  Date: 04/05/2017  DOB: 05/03/1945  End of Treatment Note  Diagnosis:   72 y.o. 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 This was delivered using a 3-D conformal technique. The patient then received a boost to the seroma. This delivered an additional 10 Gy in 4 fractions using a 3 field photon technique due to the depth of the seroma. The total dose was 52.56 Gy.  Narrative: The patient tolerated radiation treatment relatively well.   The patient had some expected skin irritation as she progressed during treatment. Moist desquamation was not present at the end of treatment.  Plan: The patient has completed radiation treatment. The patient will return to radiation oncology clinic for routine followup in one month. I advised the patient to call or return sooner if they have any questions or concerns related to their recovery or treatment. ________________________________  Jodelle Gross, MD, PhD  This document serves as a record of services personally performed by Kyung Rudd, MD. It was created on his behalf by Rae Lips, a trained medical scribe. The creation of this record is based on the scribe's personal observations and the provider's statements to them. This document has been checked and approved by the attending provider.

## 2017-04-07 NOTE — Progress Notes (Signed)
[facility]  [phone] [phone]  Clinic Follow Up Note   Patient Care Team: [name], [name], MD as PCP - General (Family Medicine) End, Harrell Gave, MD as PCP - Cardiology (Cardiology) 04/11/2017  CHIEF COMPLAINTS:  Malignant neoplasm of upper-outer quadrant of left breast in female, estrogen receptor positive    Oncology History   Cancer Staging Malignant neoplasm of upper-outer quadrant of left breast in female, estrogen receptor positive ([place]) Staging form: Breast, AJCC 8th Edition - Clinical stage from 12/13/2016: Stage IB (cT2, cN0, cM0, G2, ER: Positive, PR: Positive, HER2: Negative) - Signed by [name], MD on 12/21/2016       Malignant neoplasm of upper-outer quadrant of left breast in female, estrogen receptor positive ([place])   12/06/2016 Mammogram    Diagnostic Mammogram 12/06/16 IMPRESSION:  Suspicious mass in the left breast at 3 o'clock 2 cm from the nipple measuring 1.9 x 1.1 x 2.2 cm. RECOMMENDATION: Ultrasound-guided core biopsy of the mass in the 3 o'clock region of the left breast is recommended. The biopsy will be scheduled at the patient's convenience.      12/13/2016 Initial Biopsy    Diagnosis 12/13/16 Breast, left, needle core biopsy, 3:00 o'clock, 2cmfn - INVASIVE DUCTAL CARCINOMA - SEE COMMENT      12/16/2016 Initial Diagnosis    Malignant neoplasm of upper-outer quadrant of left breast in female, estrogen receptor positive ([place])      12/17/2016 Receptors her2    Estrogen Receptor: 100%, POSITIVE, STRONG STAINING INTENSITY Progesterone Receptor: 100%, POSITIVE, STRONG STAINING INTENSITY Proliferation Marker Ki67: 30% HER2 - NEGATIVE       01/14/2017 Surgery    Left breast lumpectomy with Dr. Donne Hazel      01/14/2017 Pathology Results    Diagnosis 01/14/17 1. Breast, lumpectomy, Left - INVASIVE DUCTAL CARCINOMA, GRADE I/III, SPANNING 2.1 CM. - DUCTAL CARCINOMA IN SITU, LOW GRADE. - INVASIVE  CARCINOMA IS BROADLY PRESENT AT THE INFERIOR MARGIN OF SPECIMEN 1. - DUCTAL CARCINOMA IN SITU IS FOCALLY PRESENT AT THE INFERIOR MARGIN OF SPECIMEN 1 AND BROADLY LESS THAN 0.1 CM TO THE LATERAL MARGIN OF SPECIMEN 1. - SEE ONCOLOGY TABLE BELOW. 2. Breast, excision, Additional medial margin left - DUCTAL CARCINOMA IN SITU, LOW GRADE. - DUCTAL CARCINOMA IS FOCALLY LESS THAN 0.1 CM TO THE NEW MARGIN OF SPECIMEN 2. 3. Breast, excision, Additional lateral margin left - DUCTAL CARCINOMA IN SITU, LOW GRADE. - DUCTAL CARCINOMA IN SITU IS GREATER THAN 0.2 CM TO ALL MARGINS. 4. Breast, excision, Additional superior margin left - DUCTAL CARCINOMA IN SITU, LOW GRADE. - DUCTAL CARCINOMA IN SITU IS BROADLY LESS THAN 0.1 CM TO THE NEW MARGIN OF SPECIMEN 4. 5. Lymph node, sentinel, biopsy, Left axillary - THERE IS NO EVIDENCE OF CARCINOMA IN 1 OF 1 LYMPH NODE (0/1). 6. Breast, excision, Additional inferior margin left - DUCTAL CARCINOMA IN SITU, LOW GRADE. - DUCTAL CARCINOMA IN SITU IS GREATER THAN 0.2 CM TO ALL MARGINS.       01/14/2017 Oncotype testing    Her oncotype recurrence score is 4 and her distance recurrent on Tamoxifen alone is 3%.       HISTORY OF PRESENTING ILLNESS: 12/22/16 Meia Pulliam-McEachean 72 y.o. female is here because of newly diagnosed left breast cancer. She presents to breast clinic today with. Her best friend and husband.   In the past she had left hip surgery and had a rod placed last year. She did PT but will go back due to her trouble  walking. She has arthritis and diagnosed with osteoporosis. She has been on Fosamax for the last 2 years. She does have back pain and knee pain arthritis. She had a stoke in 2000, she has CAD and Afib. She had open heart surgery and experienced another stroke during operation. She does experience SOB when she lays flat. She does have COPD and uses inhaler. Her mother, father had lung cancer and 2 sisters had cancer. She quit smoking in  2012 and had smoked on and off for 10 years.   Today she notes having left breast pain initially that prompted the mammogram. She also had nipple soreness but no discharge or other breast change.  She ambulates with a walker from hip surgery. She is able to take care of herself at home but her husband helps her significantly at home. She had hot flashes during her menopause and now they are gone.    GYN HISTORY  Menarchal: 11 LMP: at 72 yo Contraceptive: no HRT: no GP: G0  CURRENT THERAPY:  INTERM HISTORY: Reneisha Stilley is here for follow up.   She completed RT last week, tolerated well overall  Chronic left knee pain, uses a crane or walker, worsening low back pain lately, had MRI last week. No other pain, she has mild balance issue, had PT lately.      MEDICAL HISTORY:  Past Medical History:  Diagnosis Date  . Allergy   . Anxiety   . Aortic stenosis    Status post bioprosthetic AVR  . Arthritis    DJD  . Asthma   . Breast cancer ([place]) 12/13/2016   Left breast  . Chronic systolic CHF (congestive heart failure) ([place])    EF 30% 04/2014  . COPD (chronic obstructive pulmonary disease) ([place])   . Coronary artery disease    per pt, had LHC prior to AVR in [place] that did not show any blockages; no stents/bypass  . Gastritis   . Hyperlipidemia   . Hypertension   . Hyperthyroidism   . Multiple thyroid nodules   . Osteoporosis   . Paroxysmal atrial fibrillation ([facility])   . Pelvis fracture ([place]) 08/19/2015   MULTIPLE   . Spontaneous pneumothorax 11/28/2015   left   . Stroke ([place]) 2000   rt hand weak    SURGICAL HISTORY: Past Surgical History:  Procedure Laterality Date  . BREAST LUMPECTOMY WITH RADIOACTIVE SEED AND SENTINEL LYMPH NODE BIOPSY Left 01/14/2017   Procedure: LEFT BREAST LUMPECTOMY WITH RADIOACTIVE SEED AND SENTINEL LYMPH NODE BIOPSY;  Surgeon: Rolm Bookbinder, MD;  Location: [facility];  Service: General;  Laterality: Left;  . CHEST TUBE  INSERTION  10/2015  . INTRAMEDULLARY (IM) NAIL INTERTROCHANTERIC Left 12/10/2015   Procedure: INTRAMEDULLARY (IM) NAIL INTERTROCHANTRIC;  Surgeon: Leandrew Koyanagi, MD;  Location: [facility];  Service: Orthopedics;  Laterality: Left;  . PLEURADESIS Left 12/03/2015   Procedure: PLEURADESIS;  Surgeon: Melrose Nakayama, MD;  Location: [facility];  Service: Thoracic;  Laterality: Left;  . RESECTION OF APICAL BLEB Left 12/03/2015   Procedure: BLEBECTOMY;  Surgeon: Melrose Nakayama, MD;  Location: [facility];  Service: Thoracic;  Laterality: Left;  . THORACOSCOPY  12/03/2015  . VALVE REPLACEMENT    . VIDEO ASSISTED THORACOSCOPY Left 12/03/2015   Procedure: VIDEO ASSISTED THORACOSCOPY;  Surgeon: Melrose Nakayama, MD;  Location: [facility];  Service: Thoracic;  Laterality: Left;    SOCIAL HISTORY: Social History   Socioeconomic History  . Marital status: Married    Spouse name:  Sherwood  . Number of children: 0  . Years of education: Not on file  . Highest education level: Not on file  Social Needs  . Financial resource strain: Not on file  . Food insecurity - worry: Not on file  . Food insecurity - inability: Not on file  . Transportation needs - medical: Not on file  . Transportation needs - non-medical: Not on file  Occupational History  . Occupation: Retired in 2004  Tobacco Use  . Smoking status: Former Smoker    Packs/day: 0.25    Years: 10.00    Pack years: 2.50    Types: Cigarettes    Last attempt to quit: 03/01/2010    Years since quitting: 7.1  . Smokeless tobacco: Never Used  Substance and Sexual Activity  . Alcohol use: No  . Drug use: No  . Sexual activity: Not on file  Other Topics Concern  . Not on file  Social History Narrative   Lives with husband.  Ambulated independently.    FAMILY HISTORY: Family History  Problem Relation Age of Onset  . Diabetes Mother   . Heart attack Mother 54  . Diabetes Father   . Lung cancer Father   . Diabetes Sister   . Thyroid disease  Sister   . Diabetes Sister   . HIV Brother     ALLERGIES:  is allergic to lisinopril and tetanus toxoid adsorbed.  MEDICATIONS:  Current Outpatient Medications  Medication Sig Dispense Refill  . acetaminophen (TYLENOL) 500 MG tablet Take 1,000 mg by mouth daily as needed (PAIN).    Marland Kitchen alendronate (FOSAMAX) 70 MG tablet Take 1 tablet (70 mg total) by mouth every Saturday. with a full glass of water on an empty stomach. 13 tablet 1  . ALPRAZolam (XANAX) 0.25 MG tablet Take 1 tablet (0.25 mg total) by mouth 3 (three) times daily as needed for anxiety or sleep. (Patient not taking: Reported on 03/15/2017) 30 tablet 0  . aspirin EC 81 MG tablet Take 81 mg daily by mouth.    Marland Kitchen atorvastatin (LIPITOR) 40 MG tablet Take 1 tablet (40 mg total) by mouth at bedtime. 30 tablet 2  . budesonide-formoterol (SYMBICORT) 160-4.5 MCG/ACT inhaler Inhale 2 puffs into the lungs 2 (two) times daily. 1 Inhaler 0  . carvedilol (COREG) 25 MG tablet TAKE 1 TABLET BY MOUTH TWICE DAILY 180 tablet 2  . Cholecalciferol (VITAMIN D3) 400 units tablet Take 1 tablet (400 Units total) by mouth daily. 30 tablet 0  . diclofenac sodium (VOLTAREN) 1 % GEL Apply 4 g topically 4 (four) times daily. (Patient not taking: Reported on 03/15/2017) 500 g 3  . diphenhydramine-acetaminophen (TYLENOL PM) 25-500 MG TABS tablet Take 2 tablets by mouth at bedtime as needed (SLEEP).     . enoxaparin (LOVENOX) 100 MG/ML injection Inject 1 mL (100 mg total) into the skin 2 (two) times daily for 7 doses. 7 mL 0  . furosemide (LASIX) 20 MG tablet Take 1 tablet (20 mg total) by mouth daily as needed for fluid or edema. (Patient taking differently: Take 20 mg daily by mouth. ) 90 tablet 1  . gabapentin (NEURONTIN) 100 MG capsule Take 1 capsule (100 mg total) by mouth 3 (three) times daily. 90 capsule 1  . losartan (COZAAR) 25 MG tablet TAKE 1 TABLET BY MOUTH ONCE DAILY 90 tablet 2  . methimazole (TAPAZOLE) 5 MG tablet TAKE 3 TABLETS BY MOUTH EVERY EVENING  ( PATIENT NEEDS FOLLOW UP APPOINTMENT FOR FURTHER REFILLS) 30  tablet 0  . oxyCODONE (OXY IR/ROXICODONE) 5 MG immediate release tablet Take 1 tablet (5 mg total) every 6 (six) hours as needed by mouth for moderate pain, severe pain or breakthrough pain. (Patient not taking: Reported on 03/15/2017) 12 tablet 0  . oxyCODONE-acetaminophen (PERCOCET/ROXICET) 5-325 MG tablet Take 1 tablet by mouth every 4 (four) hours as needed for severe pain. 10 tablet 0  . ranitidine (ZANTAC) 150 MG tablet Take 1 tablet (150 mg total) by mouth 2 (two) times daily. 180 tablet 3  . senna-docusate (SENOKOT-S) 8.6-50 MG tablet Take 2 tablets at bedtime by mouth.     . traMADol (ULTRAM) 50 MG tablet Take 1 tablet (50 mg total) by mouth 3 (three) times daily. 90 tablet 5  . valACYclovir (VALTREX) 1000 MG tablet Take 1 tablet (1,000 mg total) by mouth 2 (two) times daily. 20 tablet 0  . warfarin (COUMADIN) 3 MG tablet Take as directed by anticoagulation clinic. (Patient taking differently: Take 3 mg every evening by mouth. Take as directed by anticoagulation clinic.) 40 tablet 2   No current facility-administered medications for this visit.     REVIEW OF SYSTEMS:   Constitutional: Denies fevers, chills or abnormal night sweats Eyes: Denies blurriness of vision, double vision or watery eyes Ears, nose, mouth, throat, and face: Denies mucositis or sore throat Respiratory: Denies cough,  Wheezes (+) SOB upon laying flat  Cardiovascular: Denies palpitation, chest discomfort or lower extremity swelling Gastrointestinal:  Denies nausea, heartburn or change in bowel habits Skin: Denies abnormal skin rashes Lymphatics: Denies new lymphadenopathy or easy bruising Neurological:Denies numbness, tingling or new weaknesses MSK: (+) Rod in left hip post surgery, pain 7/10 (+) arthritis in Knee (+) lower back pain. (+) ambulates with walker Behavioral/Psych: Mood is stable, no new changes  All other systems were reviewed with the  patient and are negative. BREAST: (+) left breast pain and nipple soreness   PHYSICAL EXAMINATION: ECOG PERFORMANCE STATUS: 2 - Symptomatic, <50% confined to bed  Vitals:   04/11/17 1431  BP: (!) 163/92  Pulse: 85  Resp: 18  Temp: (!) 97.5 F (36.4 C)  SpO2: 98%   Filed Weights   04/11/17 1431  Weight: 233 lb (105.7 kg)    GENERAL:alert, no distress and comfortable SKIN: skin color, texture, turgor are normal, no rashes or significant lesions EYES: normal, conjunctiva are pink and non-injected, sclera clear OROPHARYNX:no exudate, no erythema and lips, buccal mucosa, and tongue normal  NECK: supple, thyroid normal size, non-tender, without nodularity LYMPH:  no palpable lymphadenopathy in the cervical, axillary or inguinal LUNGS: clear to auscultation and percussion with normal breathing effort HEART: regular rate & rhythm and no murmurs and no lower extremity edema ABDOMEN:abdomen soft, non-tender and normal bowel sounds Musculoskeletal:no cyanosis of digits and no clubbing  PSYCH: alert & oriented x 3 with fluent speech NEURO: no focal motor/sensory deficits BREAST: (+) normal right breast exam (+) Significant skin hyperpigmentation on left breast due to radiation.  Surgical scar has healed very well.     LABORATORY DATA:  I have reviewed the data as listed CBC Latest Ref Rng & Units 01/15/2017 01/12/2017 12/22/2016  WBC 4.0 - 10.5 K/uL 6.1 4.4 5.4  Hemoglobin 12.0 - 15.0 g/dL 11.3(L) 13.1 13.2  Hematocrit 36.0 - 46.0 % 35.5(L) 40.3 40.0  Platelets 150 - 400 K/uL 108(L) 127(L) 103(L)    CMP Latest Ref Rng & Units 01/12/2017 12/22/2016 07/08/2016  Glucose 65 - 99 mg/dL 89 90 85  BUN 6 -  20 mg/dL 14 19.5 19  Creatinine 0.44 - 1.00 mg/dL 1.17(H) 1.2(H) 0.98  Sodium 135 - 145 mmol/L 137 143 140  Potassium 3.5 - 5.1 mmol/L 4.2 4.3 5.2  Chloride 101 - 111 mmol/L 105 - 102  CO2 22 - 32 mmol/L _0 Calcium 8.9 - 10.3 mg/dL 9.8 9.8 10.1  Total Protein 6.4 - 8.3 g/dL -  7.0 -  Total Bilirubin 0.20 - 1.20 mg/dL - 0.70 -  Alkaline Phos 40 - 150 U/L - 74 -  AST 5 - 34 U/L - 13 -  ALT 0 - 55 U/L - 9 -    PATHOLOGY  Diagnosis 12/13/16 Breast, left, needle core biopsy, 3:00 o'clock, 2cmfn - INVASIVE DUCTAL CARCINOMA - SEE COMMENT Microscopic Comment Based on the biopsy, the carcinoma appears Nottingham grade 2 of 3 and measures 0.8 cm in greatest linear extent. Prognostic markers (ER/PR/ki-67/HER2-FISH) are pending and will be reported in an addendum. Dr. Saralyn Pilar has reviewed the case and agrees with above diagnosis. These results were called to [facility] on December 14, 2016. PROGNOSTIC INDICATORS Results: IMMUNOHISTOCHEMICAL AND MORPHOMETRIC ANALYSIS PERFORMED MANUALLY Estrogen Receptor: 100%, POSITIVE, STRONG STAINING INTENSITY Progesterone Receptor: 100%, POSITIVE, STRONG STAINING INTENSITY Proliferation Marker Ki67: 30% FLUORESCENCE IN-SITU HYBRIDIZATION Results: HER2 - NEGATIVE RATIO OF HER2/CEP17 SIGNALS 1.11 AVERAGE HER2 COPY NUMBER PER CELL 1.95   Diagnosis 01/14/17 1. Breast, lumpectomy, Left - INVASIVE DUCTAL CARCINOMA, GRADE I/III, SPANNING 2.1 CM. - DUCTAL CARCINOMA IN SITU, LOW GRADE. - INVASIVE CARCINOMA IS BROADLY PRESENT AT THE INFERIOR MARGIN OF SPECIMEN 1. - DUCTAL CARCINOMA IN SITU IS FOCALLY PRESENT AT THE INFERIOR MARGIN OF SPECIMEN 1 AND BROADLY LESS THAN 0.1 CM TO THE LATERAL MARGIN OF SPECIMEN 1. - SEE ONCOLOGY TABLE BELOW. 2. Breast, excision, Additional medial margin left - DUCTAL CARCINOMA IN SITU, LOW GRADE. - DUCTAL CARCINOMA IS FOCALLY LESS THAN 0.1 CM TO THE NEW MARGIN OF SPECIMEN 2. 3. Breast, excision, Additional lateral margin left - DUCTAL CARCINOMA IN SITU, LOW GRADE. - DUCTAL CARCINOMA IN SITU IS GREATER THAN 0.2 CM TO ALL MARGINS. 4. Breast, excision, Additional superior margin left - DUCTAL CARCINOMA IN SITU, LOW GRADE. - DUCTAL CARCINOMA IN SITU IS BROADLY LESS THAN 0.1 CM TO THE  NEW MARGIN OF SPECIMEN 4. 5. Lymph node, sentinel, biopsy, Left axillary - THERE IS NO EVIDENCE OF CARCINOMA IN 1 OF 1 LYMPH NODE (0/1). 6. Breast, excision, Additional inferior margin left - DUCTAL CARCINOMA IN SITU, LOW GRADE. - DUCTAL CARCINOMA IN SITU IS GREATER THAN 0.2 CM TO ALL MARGINS. Microscopic Comment 1. BREAST, INVASIVE TUMOR Procedure: Seed localized lumpectomy with additional margin resections and axillary lymph node resection Laterality: Left Tumor Size: 2.1 cm (gross measurement) Histologic Type: Ductal Grade: I Tubular Differentiation: 2 Nuclear Pleomorphism: 2 1 of 4 FINAL for Danielle [name], Danielle [name] [phone]) Microscopic Comment(continued) Mitotic Count: 1 Ductal Carcinoma in Situ (DCIS): Preset, low grade, extensive Extent of Tumor: Confined to breast parenchyma. : Margins: Invasive carcinoma, distance from closest margin: Greater than 0.2 cm to all final surgical margins DCIS, distance from closest margin: Ductal carcinoma in situ is focally less than 0.1 cm to the new medial margin (specimen 2) and broadly less than 0.1 cm to new superior margin (specimen 4). Regional Lymph Nodes: Number of Lymph Nodes Examined: 1 Number of Sentinel Lymph Nodes Examined: 1 Lymph Nodes with Macrometastases: 0 Lymph Nodes with Micrometastases: 0 Lymph Nodes with Isolated Tumor Cells: 0 Breast Prognostic Profile: Case [id number] Estrogen  Receptor: 100%, strong Progesterone Receptor: 100%, strong Her2: No amplification was detached. The ratio was 1.11 Ki-67: 30% Best tumor block for sendout testing: 1A-1E Pathologic Stage Classification (pTNM, AJCC 8th Edition): Primary Tumor (pT): pT2 Regional Lymph Nodes (pN): pN0 Distant Metastases (pM): pMX (JBK:kh 01-18-17)     RADIOGRAPHIC STUDIES: I have personally reviewed the radiological images as listed and agreed with the findings in the report.  Diagnostic mammogram and [place] 12/06/16 IMPRESSION: Suspicious  left breast mass.   ASSESSMENT & PLAN:  Glennys Schorsch is a 72 y.o. African-american female with a history of Aortic Stenosis, Arthritis, Asthma/COPD, CAD, CHF history of Stroke, Osteoporosis, HTN, HLD, Hyperthyroidism, and Afib.   1. Malignant neoplasm of upper-outer quadrant of left breast, Stage IB, c(T2,N0,M0 ), ER/PR: POSITIVE, HER2: NEGATIVE, Grade II ---pt underwent a left breast lumpectomy with Dr. Donne Hazel on 01/14/17. 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 Her margins also contained DCIS. There was no evidence of carcinoma in 1/1/ lymph node biopsied. I discussed her surgical path result in details -the Oncotype Dx result was reviewed with her in details. She has low risk based on the recurrence score, which predicts 10 year distant recurrence after 5 years of tamoxifen 3%.  There is no benefit of adjuvant chemotherapy for low risk disease. -Giving the strong ER and PR expression in her tumor, I recommend adjuvant endocrine therapy. Due to her excessive arthritis and osteoporosis, I recommend tamoxifen for 5-10 years ---The potential side effects, which includes but not limited to, hot flash, skin and vaginal dryness, slightly increased risk of cardiovascular disease and cataract, small risk of thrombosis and endometrial cancer, were discussed with her in great details. Preventive strategies for thrombosis, such as being physically active, using compression stocks, avoid cigarette smoking, etc., were reviewed with her. I also recommend her to follow-up with her gynecologist once a year, and watch for vaginal spotting or bleeding, as a clinically sign of endometrial cancer, etc. She voiced good understanding, and agrees to proceed. I call in today and she will start this week.  -We also discussed the breast cancer surveillance after her surgery. She will continue annual screening mammogram, self exam, and a routine office visit with lab and exam with [place]. -I  encouraged her to have healthy diet and exercise regularly.  2. Osteoporosis, Arthritis  -Underwent Left Hip surgery with rod placement in 2017, ambulates with walker -On Fosamax   3. CAD, Afib, CHF, Aortic Stenosis, H/o Stroke  -On Coreg, Coumadin -follow-up with her primary care physician   PLAN:  -she will start Tamoxifen this week, I called in for her  -lab and f/u in 3 months, survivorship in 6 months    No orders of the defined types were placed in this encounter.   All questions were answered. The patient knows to call the clinic with any problems, questions or concerns. I spent 20 minutes counseling the patient face to face. The total time spent in the appointment was 25 minutes and more than 50% was on counseling.     [name], MD 04/11/2017 2:49 PM

## 2017-04-11 ENCOUNTER — Inpatient Hospital Stay: Payer: Medicare Other | Attending: Hematology | Admitting: Hematology

## 2017-04-11 VITALS — BP 163/92 | HR 85 | Temp 97.5°F | Resp 18 | Ht 67.0 in | Wt 233.0 lb

## 2017-04-11 DIAGNOSIS — Z8673 Personal history of transient ischemic attack (TIA), and cerebral infarction without residual deficits: Secondary | ICD-10-CM | POA: Diagnosis not present

## 2017-04-11 DIAGNOSIS — Z170 Estrogen receptor positive status [ER+]: Secondary | ICD-10-CM | POA: Insufficient documentation

## 2017-04-11 DIAGNOSIS — M25569 Pain in unspecified knee: Secondary | ICD-10-CM | POA: Diagnosis not present

## 2017-04-11 DIAGNOSIS — Z79899 Other long term (current) drug therapy: Secondary | ICD-10-CM | POA: Diagnosis not present

## 2017-04-11 DIAGNOSIS — M810 Age-related osteoporosis without current pathological fracture: Secondary | ICD-10-CM | POA: Diagnosis not present

## 2017-04-11 DIAGNOSIS — F419 Anxiety disorder, unspecified: Secondary | ICD-10-CM | POA: Insufficient documentation

## 2017-04-11 DIAGNOSIS — Z79810 Long term (current) use of selective estrogen receptor modulators (SERMs): Secondary | ICD-10-CM | POA: Diagnosis not present

## 2017-04-11 DIAGNOSIS — Z809 Family history of malignant neoplasm, unspecified: Secondary | ICD-10-CM | POA: Insufficient documentation

## 2017-04-11 DIAGNOSIS — Z87891 Personal history of nicotine dependence: Secondary | ICD-10-CM | POA: Diagnosis not present

## 2017-04-11 DIAGNOSIS — Z801 Family history of malignant neoplasm of trachea, bronchus and lung: Secondary | ICD-10-CM | POA: Diagnosis not present

## 2017-04-11 DIAGNOSIS — M549 Dorsalgia, unspecified: Secondary | ICD-10-CM | POA: Diagnosis not present

## 2017-04-11 DIAGNOSIS — I4891 Unspecified atrial fibrillation: Secondary | ICD-10-CM | POA: Diagnosis not present

## 2017-04-11 DIAGNOSIS — Z7982 Long term (current) use of aspirin: Secondary | ICD-10-CM | POA: Diagnosis not present

## 2017-04-11 DIAGNOSIS — I480 Paroxysmal atrial fibrillation: Secondary | ICD-10-CM | POA: Insufficient documentation

## 2017-04-11 DIAGNOSIS — I5022 Chronic systolic (congestive) heart failure: Secondary | ICD-10-CM | POA: Diagnosis not present

## 2017-04-11 DIAGNOSIS — M129 Arthropathy, unspecified: Secondary | ICD-10-CM | POA: Insufficient documentation

## 2017-04-11 DIAGNOSIS — E785 Hyperlipidemia, unspecified: Secondary | ICD-10-CM | POA: Insufficient documentation

## 2017-04-11 DIAGNOSIS — C50412 Malignant neoplasm of upper-outer quadrant of left female breast: Secondary | ICD-10-CM | POA: Diagnosis not present

## 2017-04-11 DIAGNOSIS — J449 Chronic obstructive pulmonary disease, unspecified: Secondary | ICD-10-CM | POA: Insufficient documentation

## 2017-04-11 DIAGNOSIS — I2510 Atherosclerotic heart disease of native coronary artery without angina pectoris: Secondary | ICD-10-CM | POA: Insufficient documentation

## 2017-04-11 DIAGNOSIS — Z7901 Long term (current) use of anticoagulants: Secondary | ICD-10-CM | POA: Diagnosis not present

## 2017-04-11 MED ORDER — TAMOXIFEN CITRATE 20 MG PO TABS: 20.0000 mg | ORAL_TABLET | Freq: Every day | ORAL | 2 refills | Status: DC

## 2017-04-11 MED ORDER — ALPRAZOLAM 0.25 MG PO TABS: 0.2500 mg | ORAL_TABLET | Freq: Every evening | ORAL | 0 refills | Status: DC | PRN

## 2017-04-12 ENCOUNTER — Telehealth: Payer: Self-pay | Admitting: Hematology

## 2017-04-12 NOTE — Telephone Encounter (Signed)
Mailed patient calendar of upcoming may and august appointments.

## 2017-04-12 NOTE — Progress Notes (Signed)
HPI:   [name] is a 72 y.o. female, who is here today for 6 months follow up.   She was last seen on 01/25/18 before     Recently Dx with left breast cancer, s/p lumpectomy. She is following with oncologist,Danielle Danielle [name].  She just completed radiation and received Rx for Tamoxifen, which she is planning on start 3-4 weeks after radiation.  She is c/o left breast pain,severe,exacerbated with movement,she does not tolerate her bra.  Hx of chronic pain,she is following with Danielle Danielle [name]. C/o worsening back pain, exacerbated by standing up and alleviated by sitting down.She has a walker at home but she is more comfortable in her wheel chair because pain and unstable gait due to left hip and knee pain.  According to pt,she is planning on starting neuro physicotherapy in a couple of weeks. She is on Gabapentin and Tramadol.  HTN:  She is currently on Losartan 25 mg daily and Coreg 25 mg bid.  She also takes Furosemide 20 mg daily as needed for LE edema.  Hx of CVA.   Exertional dyspnea is stable. Hx of COPD,she is on Symbicort 160-4.5 mcg and Albuterol inh.   She has not had headache,visual changes, chest pain,diaohoreis,or worsening LE edema.   Atrial fib,CAD,and valvular disease (S/P bioprosthetic aortic valve replacement),she is on Coumadin. She follows with coumadin clinic, today INR 2.1.  She follows with Danielle [name] 3-4 months.   Recurrent genital herpes:  Dx less than a year ago. Last year she did not have episodes. This year she has had one.  She is requesting lab work today,she is concerned about diabetes.  Thrombocytopenia,stable. She has not noted increased in bruising,gum/nose bleeding,gross hematuria,or blood in stool.  Lab Results  Component Value Date   WBC 6.1 01/15/2017   HGB 11.3 (L) 01/15/2017   HCT 35.5 (L) 01/15/2017   MCV 94.7 01/15/2017   PLT 108 (L) 01/15/2017   HLD  She is on Atorvastatin 40 mg  daily. Last FLP over a year ago.  She follows low fat diet. Tolerating medication well.  Osteoporosis: She is on Fosamax 70 mg weekly and daily Vit D. Last DEXA in 03/2016. She has been on Fosamax since 2017.  She is tolerating medication well,no side effects reported.  She has not had falls since her last OV.  Hyperthyroidism/Graves disease: She follows with endocrinologist. She is currently on Methimazole.     Review of Systems  Constitutional: Positive for fatigue. Negative for activity change, appetite change, fever and unexpected weight change.  HENT: Negative for mouth sores, nosebleeds and trouble swallowing.   Eyes: Negative for redness and visual disturbance.  Respiratory: Positive for shortness of breath. Negative for cough and wheezing.   Cardiovascular: Negative for chest pain, palpitations and leg swelling.  Gastrointestinal: Negative for abdominal pain, nausea and vomiting.       Negative for changes in bowel habits.  Endocrine: Negative for cold intolerance, heat intolerance, polydipsia, polyphagia and polyuria.  Genitourinary: Negative for decreased urine volume, difficulty urinating, dysuria and hematuria.  Musculoskeletal: Positive for arthralgias, back pain and gait problem.  Skin: Negative for rash.  Neurological: Negative for syncope, weakness and headaches.  Psychiatric/Behavioral: Negative for confusion. The patient is nervous/anxious.      Current Outpatient Medications on File Prior to Visit  Medication Sig Dispense Refill  . acetaminophen (TYLENOL) 500 MG tablet Take 1,000 mg by mouth daily as needed (PAIN).    Marland Kitchen ALPRAZolam (XANAX) 0.25 MG tablet Take  1 tablet (0.25 mg total) by mouth at bedtime as needed for anxiety or sleep. 30 tablet 0  . aspirin EC 81 MG tablet Take 81 mg daily by mouth.    Marland Kitchen atorvastatin (LIPITOR) 40 MG tablet Take 1 tablet (40 mg total) by mouth at bedtime. 30 tablet 2  . budesonide-formoterol (SYMBICORT) 160-4.5 MCG/ACT  inhaler Inhale 2 puffs into the lungs 2 (two) times daily. 1 Inhaler 0  . Cholecalciferol (VITAMIN D3) 400 units tablet Take 1 tablet (400 Units total) by mouth daily. 30 tablet 0  . diclofenac sodium (VOLTAREN) 1 % GEL Apply 4 g topically 4 (four) times daily. 500 g 3  . diphenhydramine-acetaminophen (TYLENOL PM) 25-500 MG TABS tablet Take 2 tablets by mouth at bedtime as needed (SLEEP).     . furosemide (LASIX) 20 MG tablet Take 1 tablet (20 mg total) by mouth daily as needed for fluid or edema. (Patient taking differently: Take 20 mg daily by mouth. ) 90 tablet 1  . gabapentin (NEURONTIN) 100 MG capsule Take 1 capsule (100 mg total) by mouth 3 (three) times daily. 90 capsule 1  . methimazole (TAPAZOLE) 5 MG tablet TAKE 3 TABLETS BY MOUTH EVERY EVENING ( PATIENT NEEDS FOLLOW UP APPOINTMENT FOR FURTHER REFILLS) 30 tablet 0  . ranitidine (ZANTAC) 150 MG tablet Take 1 tablet (150 mg total) by mouth 2 (two) times daily. 180 tablet 3  . senna-docusate (SENOKOT-S) 8.6-50 MG tablet Take 2 tablets at bedtime by mouth.     . tamoxifen (NOLVADEX) 20 MG tablet Take 1 tablet (20 mg total) by mouth daily. 30 tablet 2  . traMADol (ULTRAM) 50 MG tablet Take 1 tablet (50 mg total) by mouth 3 (three) times daily. 90 tablet 5  . warfarin (COUMADIN) 3 MG tablet Take as directed by anticoagulation clinic. (Patient taking differently: Take 3 mg every evening by mouth. Take as directed by anticoagulation clinic.) 40 tablet 2   No current facility-administered medications on file prior to visit.      Past Medical History:  Diagnosis Date  . Allergy   . Anxiety   . Aortic stenosis    Status post bioprosthetic AVR  . Arthritis    DJD  . Asthma   . Breast cancer ([place]) 12/13/2016   Left breast  . Chronic systolic CHF (congestive heart failure) ([place])    EF 30% 04/2014  . COPD (chronic obstructive pulmonary disease) ([place])   . Coronary artery disease    per pt, had LHC prior to AVR in [place] that did not  show any blockages; no stents/bypass  . Gastritis   . Hyperlipidemia   . Hypertension   . Hyperthyroidism   . Multiple thyroid nodules   . Osteoporosis   . Paroxysmal atrial fibrillation (H[facility])   . Pelvis fracture ([place]) 08/19/2015   MULTIPLE   . Spontaneous pneumothorax 11/28/2015   left   . Stroke [facility]) 2000   rt hand weak   Allergies  Allergen Reactions  . Lisinopril Cough  . Tetanus Toxoid Adsorbed Swelling    Arm swelling    Social History   Socioeconomic History  . Marital status: Married    Spouse name: Sherwood  . Number of children: 0  . Years of education: None  . Highest education level: None  Social Needs  . Financial resource strain: None  . Food insecurity - worry: None  . Food insecurity - inability: None  . Transportation needs - medical: None  . Transportation needs - non-medical:  None  Occupational History  . Occupation: Retired in 2004  Tobacco Use  . Smoking status: Former Smoker    Packs/day: 0.25    Years: 10.00    Pack years: 2.50    Types: Cigarettes    Last attempt to quit: 03/01/2010    Years since quitting: 7.1  . Smokeless tobacco: Never Used  Substance and Sexual Activity  . Alcohol use: No  . Drug use: No  . Sexual activity: None  Other Topics Concern  . None  Social History Narrative   Lives with husband.  Ambulated independently.    Vitals:   04/13/17 1605  BP: 130/80  Pulse: 81  Resp: 12  Temp: 97.9 F (36.6 C)  SpO2: 97%   Body mass index is 36.36 kg/m.   Physical Exam  Nursing note and vitals reviewed. Constitutional: She is oriented to person, place, and time. She appears well-developed. No distress.  HENT:  Head: Normocephalic and atraumatic.  Mouth/Throat: Oropharynx is clear and moist and mucous membranes are normal.  Eyes: Conjunctivae are normal. Pupils are equal, round, and reactive to light.  Cardiovascular: Normal rate and regular rhythm.  No murmur heard. DP pulses present bilateral.  Respiratory:  Effort normal and breath sounds normal. No respiratory distress.  GI: Soft. She exhibits no mass. There is no tenderness.  Musculoskeletal: She exhibits no edema.  Lymphadenopathy:    She has no cervical adenopathy.  Neurological: She is alert and oriented to person, place, and time. She has normal strength. Coordination normal.  She is in her wheel chair.  Skin: Skin is warm. No rash noted. No erythema.  Psychiatric: Her mood appears anxious.  Fairly groomed, good eye contact.     ASSESSMENT AND PLAN:   [name] was seen today for 6 months follow-up.  Orders Placed This Encounter  Procedures  . Basic metabolic panel  . Hemoglobin A1c  . Lipid panel  . CBC with Differential/Platelet   Lab Results  Component Value Date   CHOL 233 (H) 04/13/2017   HDL 69.20 04/13/2017   LDLCALC 145 (H) 04/13/2017   TRIG 96.0 04/13/2017   CHOLHDL 3 04/13/2017   Lab Results  Component Value Date   CREATININE 1.10 04/13/2017   BUN 17 04/13/2017   NA 139 04/13/2017   K 4.7 04/13/2017   CL 105 04/13/2017   CO2 27 04/13/2017   Lab Results  Component Value Date   HGBA1C 5.1 04/13/2017   Lab Results  Component Value Date   WBC 4.0 04/13/2017   HGB 13.5 04/13/2017   HCT 40.4 04/13/2017   MCV 96.5 04/13/2017   PLT 119.0 (L) 04/13/2017    Osteoporosis, unspecified osteoporosis type, unspecified pathological fracture presence  Tolerating medication well,no changes. Fall prevention discussed. DEXA in 2-3 years.  -     alendronate (FOSAMAX) 70 MG tablet; Take 1 tablet (70 mg total) by mouth every Saturday. with a full glass of water on an empty stomach.  Essential hypertension  Adequately controlled. No changes in current management. DASH and low salt diet to continue. Eye exam recommended annually. Since she follows with Danielle End a few times per year,I will see her back in 12 months, before if needed.   -     Basic metabolic panel -     CBC with  Differential/Platelet  Recurrent genital herpes  Continue Valtrex as needed,Rx given. F/U in 12 months.  -     valACYclovir (VALTREX) 500 MG tablet; Take 1 tablet (500  mg total) by mouth 2 (two) times daily. For 3 days when outbreaks.  Hyperlipidemia, unspecified hyperlipidemia type  No changes in current management, will follow labs done today and will give further recommendations accordingly. F/U in 12 months.  -     Lipid panel  Morbid obesity due to excess calories (HCC)  We discussed benefits of wt loss as well as adverse effects of obesity. Consistency with healthy diet and physical activity recommended. Because chronic pain and unstable gait it is difficult for her to exercise,planning on starting PT.  -     Hemoglobin A1c  Chronic pain disorder  Continue following with Danielle Danae Chen.  Hyperthyroidism  She will continue following with endocrinologist.   Thrombocytopenia ([place])  It has been stable. Instructed about warning signs.    -[name] was advised to return sooner than planned today if new concerns arise.       [name], MD  [facility]. [place] office.

## 2017-04-13 ENCOUNTER — Ambulatory Visit (INDEPENDENT_AMBULATORY_CARE_PROVIDER_SITE_OTHER): Payer: Medicare Other | Admitting: Family Medicine

## 2017-04-13 ENCOUNTER — Encounter: Payer: Self-pay | Admitting: Family Medicine

## 2017-04-13 ENCOUNTER — Ambulatory Visit (INDEPENDENT_AMBULATORY_CARE_PROVIDER_SITE_OTHER): Payer: Medicare Other | Admitting: General Practice

## 2017-04-13 VITALS — BP 130/80 | HR 81 | Temp 97.9°F | Resp 12 | Ht 67.0 in | Wt 232.1 lb

## 2017-04-13 DIAGNOSIS — D696 Thrombocytopenia, unspecified: Secondary | ICD-10-CM

## 2017-04-13 DIAGNOSIS — G894 Chronic pain syndrome: Secondary | ICD-10-CM

## 2017-04-13 DIAGNOSIS — Z7901 Long term (current) use of anticoagulants: Secondary | ICD-10-CM

## 2017-04-13 DIAGNOSIS — I10 Essential (primary) hypertension: Secondary | ICD-10-CM

## 2017-04-13 DIAGNOSIS — A6000 Herpesviral infection of urogenital system, unspecified: Secondary | ICD-10-CM

## 2017-04-13 DIAGNOSIS — M810 Age-related osteoporosis without current pathological fracture: Secondary | ICD-10-CM | POA: Diagnosis not present

## 2017-04-13 DIAGNOSIS — E785 Hyperlipidemia, unspecified: Secondary | ICD-10-CM | POA: Diagnosis not present

## 2017-04-13 DIAGNOSIS — E0590 Thyrotoxicosis, unspecified without thyrotoxic crisis or storm: Secondary | ICD-10-CM

## 2017-04-13 LAB — POCT INR: INR: 2.1

## 2017-04-13 MED ORDER — ALENDRONATE SODIUM 70 MG PO TABS: 70.0000 mg | ORAL_TABLET | ORAL | 3 refills | Status: DC

## 2017-04-13 MED ORDER — VALACYCLOVIR HCL 500 MG PO TABS
500.0000 mg | ORAL_TABLET | Freq: Two times a day (BID) | ORAL | 0 refills | Status: DC
Start: 2017-04-13 — End: 2018-01-04

## 2017-04-13 NOTE — Patient Instructions (Addendum)
Pre visit review using our clinic review tool, if applicable. No additional management support is needed unless otherwise documented below in the visit note.  Continue to take 1 tablet daily except 1 1/2 tablets on Wednesdays.  Re-check in 4 weeks. (Use only 3 mg tablets only).

## 2017-04-13 NOTE — Patient Instructions (Signed)
A few things to remember from today's visit:   Chronic pain disorder  Osteoporosis, unspecified osteoporosis type, unspecified pathological fracture presence - Plan: alendronate (FOSAMAX) 70 MG tablet  Essential hypertension  Recurrent genital herpes - Plan: valACYclovir (VALTREX) 500 MG tablet  Valtrex 500 mg 2 times daily x 5 days as soon as lesion starts.  I will continue seeing you annually since you are already following with other providers.   Please be sure medication list is accurate. If a new problem present, please set up appointment sooner than planned today.

## 2017-04-14 ENCOUNTER — Encounter: Payer: Self-pay | Admitting: Internal Medicine

## 2017-04-14 ENCOUNTER — Ambulatory Visit (INDEPENDENT_AMBULATORY_CARE_PROVIDER_SITE_OTHER): Payer: Medicare Other | Admitting: Internal Medicine

## 2017-04-14 VITALS — BP 118/80 | HR 82 | Wt 233.0 lb

## 2017-04-14 DIAGNOSIS — I428 Other cardiomyopathies: Secondary | ICD-10-CM

## 2017-04-14 DIAGNOSIS — I480 Paroxysmal atrial fibrillation: Secondary | ICD-10-CM

## 2017-04-14 DIAGNOSIS — I359 Nonrheumatic aortic valve disorder, unspecified: Secondary | ICD-10-CM

## 2017-04-14 DIAGNOSIS — I5042 Chronic combined systolic (congestive) and diastolic (congestive) heart failure: Secondary | ICD-10-CM | POA: Diagnosis not present

## 2017-04-14 LAB — CBC WITH DIFFERENTIAL/PLATELET
BASOS PCT: 0.7 % (ref 0.0–3.0)
Basophils Absolute: 0 10*3/uL (ref 0.0–0.1)
EOS PCT: 7.3 % — AB (ref 0.0–5.0)
Eosinophils Absolute: 0.3 10*3/uL (ref 0.0–0.7)
HCT: 40.4 % (ref 36.0–46.0)
Hemoglobin: 13.5 g/dL (ref 12.0–15.0)
LYMPHS ABS: 0.8 10*3/uL (ref 0.7–4.0)
Lymphocytes Relative: 20.3 % (ref 12.0–46.0)
MCHC: 33.4 g/dL (ref 30.0–36.0)
MCV: 96.5 fl (ref 78.0–100.0)
MONO ABS: 0.5 10*3/uL (ref 0.1–1.0)
Monocytes Relative: 11.8 % (ref 3.0–12.0)
NEUTROS PCT: 59.9 % (ref 43.0–77.0)
Neutro Abs: 2.4 10*3/uL (ref 1.4–7.7)
Platelets: 119 10*3/uL — ABNORMAL LOW (ref 150.0–400.0)
RBC: 4.18 Mil/uL (ref 3.87–5.11)
RDW: 14.5 % (ref 11.5–15.5)
WBC: 4 10*3/uL (ref 4.0–10.5)

## 2017-04-14 LAB — BASIC METABOLIC PANEL
BUN: 17 mg/dL (ref 6–23)
CHLORIDE: 105 meq/L (ref 96–112)
CO2: 27 mEq/L (ref 19–32)
Calcium: 9.7 mg/dL (ref 8.4–10.5)
Creatinine, Ser: 1.1 mg/dL (ref 0.40–1.20)
GFR: 62.92 mL/min (ref >60.00)
Glucose, Bld: 84 mg/dL (ref 70–99)
POTASSIUM: 4.7 meq/L (ref 3.5–5.1)
Sodium: 139 mEq/L (ref 135–145)

## 2017-04-14 LAB — LIPID PANEL
CHOLESTEROL: 233 mg/dL — AB (ref 0–200)
HDL: 69.2 mg/dL (ref >39.00)
LDL Cholesterol: 145 mg/dL — ABNORMAL HIGH (ref 0–99)
NonHDL: 163.72
Total CHOL/HDL Ratio: 3
Triglycerides: 96 mg/dL (ref 0.0–149.0)
VLDL: 19.2 mg/dL (ref 0.0–40.0)

## 2017-04-14 LAB — HEMOGLOBIN A1C: HEMOGLOBIN A1C: 5.1 % (ref 4.6–6.5)

## 2017-04-14 MED ORDER — LOSARTAN POTASSIUM 50 MG PO TABS: ORAL_TABLET | ORAL | 3 refills | Status: DC

## 2017-04-14 MED ORDER — CARVEDILOL 25 MG PO TABS: 25.0000 mg | ORAL_TABLET | Freq: Two times a day (BID) | ORAL | 2 refills | Status: DC

## 2017-04-14 NOTE — Progress Notes (Signed)
Follow-up Outpatient Visit Date: 04/14/2017  Primary Care Provider: [name], [name], MD [address] [place] [address]  Chief Complaint: Shortness of breath  HPI:  Danielle [name] is a 72 y.o. year-old female with history of aortic valve disease (severe regurgitation by her description) s/p bioprosthetic aortic valve replacement in [place] in 03/2014, NICM with LVEF as low as 30-35% by report in 03/2015, paroxysmal atrial fibrillation on chronic warfarin, stroke x 2, hyperlipidemia, hypertension, hyperthyroidism, spontaneous pneumothorax, and left hip fracture, who presents for follow-up of shortness of breath with chronic systolic heart failure.  Today, Danielle [name] reports feeling about the same as at our last visit. She continues to have dyspnea with mild activity, such as putting on clothes or walking for more than a few minutes at a time. She denies chest pain, palpitations, lightheadedness, and edema. Her weight has been stable. Since our last visit, the patient was diagnosed with left breast cancer and has completed XRT. She is due to begin tamoxifen soon.  --------------------------------------------------------------------------------------------------  Cardiovascular History & Procedures: Cardiovascular Problems:  Aortic valve disease status post bioprosthetic AVR in 03/2014  Non-ischemic cardiomyopathy  Paroxysmal atrial fibrillation  Stroke  Risk Factors:  Hypertension, hyperlipidemia, stroke, and age > 67  Cath/PCI:  None available (patient reports cath without significant CAD in the past)  CV Surgery:  Bioprosthetic aortic valve replacement (03/12/14, [place], [place])  EP Procedures and Devices:  None  Non-Invasive Evaluation(s):  TTE (04/21/16): Normal obese size with moderate LVH. LVEF 35-40% with mid and apical anteroseptal hypokinesis. Grade 3 diastolic dysfunction noted. Aortic valve bioprosthesis present with a  mean gradient of 11 mmHg. Mitral annular calcification noted. Normal RV size and function. Mild right atrial enlargement.  TTE (03/27/15, OSH): Mild LVH with LVEF 30-35%, mild left atrial enlargement, AVR in place without regurgitation.  TTE (05/10/14, OSH): LVEF 45-50%  Recent CV Pertinent Labs: Lab Results  Component Value Date   CHOL 233 (H) 04/13/2017   HDL 69.20 04/13/2017   LDLCALC 145 (H) 04/13/2017   TRIG 96.0 04/13/2017   CHOLHDL 3 04/13/2017   INR 2.1 04/13/2017   INR 1.97 03/15/2017   K 4.7 04/13/2017   K 4.3 12/22/2016   BUN 17 04/13/2017   BUN 19.5 12/22/2016   CREATININE 1.10 04/13/2017   CREATININE 1.2 (H) 12/22/2016    Past medical and surgical history were reviewed and updated in EPIC.  Current Meds  Medication Sig  . acetaminophen (TYLENOL) 500 MG tablet Take 1,000 mg by mouth daily as needed (PAIN).  Derrill Memo ON 04/16/2017] alendronate (FOSAMAX) 70 MG tablet Take 1 tablet (70 mg total) by mouth every Saturday. with a full glass of water on an empty stomach.  . ALPRAZolam (XANAX) 0.25 MG tablet Take 1 tablet (0.25 mg total) by mouth at bedtime as needed for anxiety or sleep.  Marland Kitchen aspirin EC 81 MG tablet Take 81 mg daily by mouth.  . budesonide-formoterol (SYMBICORT) 160-4.5 MCG/ACT inhaler Inhale 2 puffs into the lungs 2 (two) times daily.  . carvedilol (COREG) 25 MG tablet Take 1 tablet (25 mg total) by mouth 2 (two) times daily.  . Cholecalciferol (VITAMIN D3) 400 units tablet Take 1 tablet (400 Units total) by mouth daily.  . diclofenac sodium (VOLTAREN) 1 % GEL Apply 4 g topically 4 (four) times daily.  . diphenhydramine-acetaminophen (TYLENOL PM) 25-500 MG TABS tablet Take 2 tablets by mouth at bedtime as needed (SLEEP).   . furosemide (LASIX) 20 MG tablet Take 1  tablet (20 mg total) by mouth daily as needed for fluid or edema. (Patient taking differently: Take 20 mg daily by mouth. )  . gabapentin (NEURONTIN) 100 MG capsule Take 1 capsule (100 mg total) by  mouth 3 (three) times daily.  . ranitidine (ZANTAC) 150 MG tablet Take 1 tablet (150 mg total) by mouth 2 (two) times daily.  Marland Kitchen senna-docusate (SENOKOT-S) 8.6-50 MG tablet Take 2 tablets at bedtime by mouth.   . tamoxifen (NOLVADEX) 20 MG tablet Take 1 tablet (20 mg total) by mouth daily.  . traMADol (ULTRAM) 50 MG tablet Take 1 tablet (50 mg total) by mouth 3 (three) times daily.  . valACYclovir (VALTREX) 500 MG tablet Take 1 tablet (500 mg total) by mouth 2 (two) times daily. For 3 days when outbreaks.  Marland Kitchen warfarin (COUMADIN) 3 MG tablet Take as directed by anticoagulation clinic. (Patient taking differently: Take 3 mg every evening by mouth. Take as directed by anticoagulation clinic.)  . [DISCONTINUED] atorvastatin (LIPITOR) 40 MG tablet Take 1 tablet (40 mg total) by mouth at bedtime.  . [DISCONTINUED] carvedilol (COREG) 25 MG tablet TAKE 1 TABLET BY MOUTH TWICE DAILY  . [DISCONTINUED] losartan (COZAAR) 25 MG tablet TAKE 1 TABLET BY MOUTH ONCE DAILY  . [DISCONTINUED] methimazole (TAPAZOLE) 5 MG tablet TAKE 3 TABLETS BY MOUTH EVERY EVENING ( PATIENT NEEDS FOLLOW UP APPOINTMENT FOR FURTHER REFILLS)    Allergies: Lisinopril and Tetanus toxoid adsorbed  Social History   Socioeconomic History  . Marital status: Married    Spouse name: Sherwood  . Number of children: 0  . Years of education: Not on file  . Highest education level: Not on file  Social Needs  . Financial resource strain: Not on file  . Food insecurity - worry: Not on file  . Food insecurity - inability: Not on file  . Transportation needs - medical: Not on file  . Transportation needs - non-medical: Not on file  Occupational History  . Occupation: Retired in 2004  Tobacco Use  . Smoking status: Former Smoker    Packs/day: 0.25    Years: 10.00    Pack years: 2.50    Types: Cigarettes    Last attempt to quit: 03/01/2010    Years since quitting: 7.1  . Smokeless tobacco: Never Used  Substance and Sexual Activity  .  Alcohol use: No  . Drug use: No  . Sexual activity: Not on file  Other Topics Concern  . Not on file  Social History Narrative   Lives with husband.  Ambulated independently.    Family History  Problem Relation Age of Onset  . Diabetes Mother   . Heart attack Mother 14  . Diabetes Father   . Lung cancer Father   . Diabetes Sister   . Thyroid disease Sister   . Diabetes Sister   . HIV Brother     Review of Systems: A 12-system review of systems was performed and was negative except as noted in the HPI.  --------------------------------------------------------------------------------------------------  Physical Exam: BP 118/80   Pulse 82   Wt 233 lb (105.7 kg)   BMI 36.49 kg/m   General:  Obese woman, seated comfortably in the exam room. HEENT: No conjunctival pallor or scleral icterus. Moist mucous membranes.  OP clear. Neck: Supple without lymphadenopathy, thyromegaly, JVD, or HJR. Lungs: Normal work of breathing. Clear to auscultation bilaterally without wheezes or crackles. Heart: Regular rate and rhythm with 1/6 systolic murmur. No rubs or gallops. Abd: Bowel sounds present. Soft,  NT/ND without hepatosplenomegaly Ext: Trace pretibial edema. Skin: Warm and dry without rash.  EKG:  NSR, LVH, and inferior and anterolateral Q-waves.  Lab Results  Component Value Date   WBC 4.0 04/13/2017   HGB 13.5 04/13/2017   HCT 40.4 04/13/2017   MCV 96.5 04/13/2017   PLT 119.0 (L) 04/13/2017    Lab Results  Component Value Date   NA 139 04/13/2017   K 4.7 04/13/2017   CL 105 04/13/2017   CO2 27 04/13/2017   BUN 17 04/13/2017   CREATININE 1.10 04/13/2017   GLUCOSE 84 04/13/2017   ALT 9 12/22/2016    Lab Results  Component Value Date   CHOL 233 (H) 04/13/2017   HDL 69.20 04/13/2017   LDLCALC 145 (H) 04/13/2017   TRIG 96.0 04/13/2017   CHOLHDL 3 04/13/2017     --------------------------------------------------------------------------------------------------  ASSESSMENT AND PLAN: 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 She continues to have function limitations consistent with NYHA class III HF. Some of this, however, may be related to her recent breast cancer diagnosis and XRT. We will continue her currnt doses of furosemide and carvedilol. We will increase losartan to 50 mg daily and recheck a BMP in 1-2 weeks. If her symptoms do not improve with optimization of GDMT, I will refer her to the advanced heart failure clinic.  Aortic valve disease s/p AVR Stable symptoms and exam. Continue ASA and warfarin (given h/o a-fib). Patient reminded of antibiotic prophylaxis for any dental procedures.  Paroxysmal atrial fibrillation EKG again shows sinus rhythm today. We will continue with warfarin and carvedilol.  Follow-up: Return to clinic in 6 months.  [name], MD 04/15/2017 7:39 PM

## 2017-04-14 NOTE — Patient Instructions (Addendum)
Medication Instructions:  INCREASE Losartan to 50mg  by mouth daily   -- If you need a refill on your cardiac medications before your next appointment, please call your pharmacy. --  Labwork: February 28 BMET   Testing/Procedures: None ordered  Follow-Up: Your physician wants you to follow-up in: 6 month with Dr. Saunders Revel.    You will receive a reminder letter in the mail two months in advance. If you don't receive a letter, please call our office to schedule the follow-up appointment.  Thank you for choosing [facility]!!    Any Other Special Instructions Will Be Listed Below (If Applicable).

## 2017-04-15 ENCOUNTER — Telehealth: Payer: Self-pay | Admitting: Endocrinology

## 2017-04-15 ENCOUNTER — Encounter: Payer: Self-pay | Admitting: Internal Medicine

## 2017-04-15 ENCOUNTER — Other Ambulatory Visit: Payer: Self-pay

## 2017-04-15 ENCOUNTER — Other Ambulatory Visit: Payer: Self-pay | Admitting: *Deleted

## 2017-04-15 MED ORDER — METHIMAZOLE 5 MG PO TABS: ORAL_TABLET | ORAL | 1 refills | Status: DC

## 2017-04-15 MED ORDER — ATORVASTATIN CALCIUM 80 MG PO TABS: 80.0000 mg | ORAL_TABLET | Freq: Every day | ORAL | 3 refills | Status: DC

## 2017-04-15 NOTE — Telephone Encounter (Signed)
Due to the cancer treatments she is going thru she is not able to make appts often she really needs her thyroid meds, she has been told she needs an appt but does not feel like she can come in. Thyroid med will be out by Monday what can we do?

## 2017-04-15 NOTE — Telephone Encounter (Signed)
Please advise on below  

## 2017-04-15 NOTE — Telephone Encounter (Signed)
I have refilled x1.

## 2017-04-15 NOTE — Telephone Encounter (Signed)
Please refill x 1 Options are f/u here, or ask your PCP to manage this

## 2017-04-17 ENCOUNTER — Encounter: Payer: Self-pay | Admitting: Hematology

## 2017-04-18 ENCOUNTER — Encounter: Payer: Medicare Other | Attending: Physical Medicine & Rehabilitation

## 2017-04-18 ENCOUNTER — Ambulatory Visit: Payer: Medicare Other | Admitting: Physical Medicine & Rehabilitation

## 2017-04-18 ENCOUNTER — Encounter: Payer: Self-pay | Admitting: Physical Medicine & Rehabilitation

## 2017-04-18 VITALS — BP 142/87 | HR 81

## 2017-04-18 DIAGNOSIS — R2689 Other abnormalities of gait and mobility: Secondary | ICD-10-CM

## 2017-04-18 DIAGNOSIS — M5442 Lumbago with sciatica, left side: Secondary | ICD-10-CM | POA: Diagnosis not present

## 2017-04-18 DIAGNOSIS — Z7901 Long term (current) use of anticoagulants: Secondary | ICD-10-CM | POA: Diagnosis not present

## 2017-04-18 DIAGNOSIS — M545 Low back pain: Secondary | ICD-10-CM | POA: Insufficient documentation

## 2017-04-18 DIAGNOSIS — M810 Age-related osteoporosis without current pathological fracture: Secondary | ICD-10-CM | POA: Diagnosis not present

## 2017-04-18 DIAGNOSIS — M48061 Spinal stenosis, lumbar region without neurogenic claudication: Secondary | ICD-10-CM | POA: Diagnosis not present

## 2017-04-18 DIAGNOSIS — M25561 Pain in right knee: Secondary | ICD-10-CM | POA: Insufficient documentation

## 2017-04-18 DIAGNOSIS — M25562 Pain in left knee: Secondary | ICD-10-CM | POA: Diagnosis not present

## 2017-04-18 DIAGNOSIS — G8929 Other chronic pain: Secondary | ICD-10-CM | POA: Diagnosis not present

## 2017-04-18 NOTE — Patient Instructions (Signed)
PT referral for balance

## 2017-04-18 NOTE — Progress Notes (Signed)
Subjective:    Patient ID: Danielle [name], female    DOB: 1945/08/29, 72 y.o.   MRN: [id number]  HPI   Pt is here with chief complaint of low back pain and Left hip pain.  No pain radiating to foot.  No numbness or weakness in LE other than generally weak. Finished rad tx for invasive ductal carcinoma.starting Tamoxifen  Pain Inventory Average Pain 8 Pain Right Now 7 My pain is n/a  In the last 24 hours, has pain interfered with the following? General activity 0 Relation with others 0 Enjoyment of life 0 What TIME of day is your pain at its worst? morning Sleep (in general) Poor  Pain is worse with: walking and standing Pain improves with: medication Relief from Meds: 4  Mobility use a walker Do you have any goals in this area?  yes  Function retired I need assistance with the following:  household duties and shopping Do you have any goals in this area?  no  Neuro/Psych No problems in this area  Prior Studies Any changes since last visit?  yes CT/MRI CLINICAL DATA:  Chronic bilateral low back pain with left-sided sciatica.  EXAM: MRI LUMBAR SPINE WITHOUT CONTRAST  TECHNIQUE: Multiplanar, multisequence MR imaging of the lumbar spine was performed. No intravenous contrast was administered.  COMPARISON:  None.  FINDINGS: Segmentation:  Standard based on the available coverage.  Alignment:  Negative for listhesis.  Vertebrae:  No fracture, evidence of discitis, or bone lesion.  Conus medullaris and cauda equina: Conus extends to the L1-2 level. Conus and cauda equina appear normal.  Paraspinal and other soft tissues: Tiny presumed cystic intensities in the right renal cortex. There is a partially covered left ovarian cyst measuring 3 cm. A left ovarian cyst with simple CT appearance was seen 08/19/2015; dimensions are stable from that study.  Disc levels:  T12- L1: Unremarkable.  L1-L2: Mild disc narrowing and bulging.  No  impingement  L2-L3: Moderate disc narrowing with mild bulging. No visible impingement  L3-L4: Mild disc narrowing and circumferential bulging. No noted impingement.  L4-L5: Severe degenerative disc narrowing with asymmetric left far-lateral ridging. Height loss and ridging causes moderate left foraminal stenosis. Patent canal and right foramen. Mild facet spurring.  L5-S1:Disc narrowing and bulging with a shallow left paracentral protrusion. The left S1 nerve root is posteriorly displaced but there is no compression. Mild left foraminal narrowing. Mild facet spurring.  IMPRESSION: 1. L4-5 advanced disc degeneration with disc collapse and ridging causing moderate left foraminal stenosis. 2. L5-S1 shallow left paracentral disc protrusion with noncompressive mass effect on the descending left S1 nerve root. 3. 3 cm left ovarian cyst, size stable from 2017 pelvis CT.   Electronically Signed   By: Monte Fantasia M.D.   On: 04/06/2017 17:15 Physicians involved in your care Any changes since last visit?  no   Family History  Problem Relation Age of Onset  . Diabetes Mother   . Heart attack Mother 58  . Diabetes Father   . Lung cancer Father   . Diabetes Sister   . Thyroid disease Sister   . Diabetes Sister   . HIV Brother    Social History   Socioeconomic History  . Marital status: Married    Spouse name: Sherwood  . Number of children: 0  . Years of education: Not on file  . Highest education level: Not on file  Social Needs  . Financial resource strain: Not on file  . Food insecurity -  worry: Not on file  . Food insecurity - inability: Not on file  . Transportation needs - medical: Not on file  . Transportation needs - non-medical: Not on file  Occupational History  . Occupation: Retired in 2004  Tobacco Use  . Smoking status: Former Smoker    Packs/day: 0.25    Years: 10.00    Pack years: 2.50    Types: Cigarettes    Last attempt to quit: 03/01/2010     Years since quitting: 7.1  . Smokeless tobacco: Never Used  Substance and Sexual Activity  . Alcohol use: No  . Drug use: No  . Sexual activity: Not on file  Other Topics Concern  . Not on file  Social History Narrative   Lives with husband.  Ambulated independently.   Past Surgical History:  Procedure Laterality Date  . BREAST LUMPECTOMY WITH RADIOACTIVE SEED AND SENTINEL LYMPH NODE BIOPSY Left 01/14/2017   Procedure: LEFT BREAST LUMPECTOMY WITH RADIOACTIVE SEED AND SENTINEL LYMPH NODE BIOPSY;  Surgeon: Rolm Bookbinder, MD;  Location: [place];  Service: General;  Laterality: Left;  . CHEST TUBE INSERTION  10/2015  . INTRAMEDULLARY (IM) NAIL INTERTROCHANTERIC Left 12/10/2015   Procedure: INTRAMEDULLARY (IM) NAIL INTERTROCHANTRIC;  Surgeon: Leandrew Koyanagi, MD;  Location: [place];  Service: Orthopedics;  Laterality: Left;  . PLEURADESIS Left 12/03/2015   Procedure: PLEURADESIS;  Surgeon: Melrose Nakayama, MD;  Location: [place];  Service: Thoracic;  Laterality: Left;  . RESECTION OF APICAL BLEB Left 12/03/2015   Procedure: BLEBECTOMY;  Surgeon: Melrose Nakayama, MD;  Location: [place];  Service: Thoracic;  Laterality: Left;  . THORACOSCOPY  12/03/2015  . VALVE REPLACEMENT    . VIDEO ASSISTED THORACOSCOPY Left 12/03/2015   Procedure: VIDEO ASSISTED THORACOSCOPY;  Surgeon: Melrose Nakayama, MD;  Location: [place];  Service: Thoracic;  Laterality: Left;   Past Medical History:  Diagnosis Date  . Allergy   . Anxiety   . Aortic stenosis    Status post bioprosthetic AVR  . Arthritis    DJD  . Asthma   . Breast cancer ([place]) 12/13/2016   Left breast  . Chronic systolic CHF (congestive heart failure) ([place])    EF 30% 04/2014  . COPD (chronic obstructive pulmonary disease) ([place])   . Coronary artery disease    per pt, had LHC prior to AVR in [place] that did not show any blockages; no stents/bypass  . Gastritis   . Hyperlipidemia   . Hypertension   . Hyperthyroidism   .  Multiple thyroid nodules   . Osteoporosis   . Paroxysmal atrial fibrillation (H[facility])   . Pelvis fracture ([place]) 08/19/2015   MULTIPLE   . Spontaneous pneumothorax 11/28/2015   left   . Stroke [facility]) 2000   rt hand weak   There were no vitals taken for this visit.  Opioid Risk Score:   Fall Risk Score:  `1  Depression screen PHQ 2/9  Depression screen [facility] 2/9 01/18/2017 01/13/2017 12/07/2016 05/20/2016 01/02/2016 12/23/2015  Decreased Interest 0 0 0 0 0 0  Down, Depressed, Hopeless 0 0 0 0 0 0  PHQ - 2 Score 0 0 0 0 0 0  Altered sleeping - - 2 - - -  Tired, decreased energy - - 2 - - -  Change in appetite - - 2 - - -  Feeling bad or failure about yourself  - - 0 - - -  Trouble concentrating - - 2 - - -  Moving  slowly or fidgety/restless - - 2 - - -  Suicidal thoughts - - 0 - - -  PHQ-9 Score - - 10 - - -  Difficult doing work/chores - - Not difficult at all - - -       Review of Systems  Musculoskeletal: Positive for gait problem.  All other systems reviewed and are negative.      Objective:   Physical Exam  Constitutional: She is oriented to person, place, and time. She appears well-developed and well-nourished.  HENT:  Head: Normocephalic and atraumatic.  Eyes: Conjunctivae and EOM are normal. Pupils are equal, round, and reactive to light.  Neurological: She is alert and oriented to person, place, and time. She has normal strength. No cranial nerve deficit or sensory deficit. Gait abnormal.  Reflex Scores:      Patellar reflexes are 0 on the right side and 1+ on the left side.      Achilles reflexes are 1+ on the right side and 1+ on the left side. Gait without toe drag or knee instability  Motors 5/5 in BLE  Sensation intact to pp in BLE  Skin: She is not diaphoretic.  Psychiatric: She has a normal mood and affect. Her behavior is normal.          Assessment & Plan:  1.  Chronic low back pain with Left hip pain.  THere is moderate foraminal stenosis at  L4-5 and severe DDD at that level.  Likely pain is a combination of the 2 factors.  We discussed that given lack of clear cut radicular signs, would initially treat as axial back pain with medial branch blocks L3-4 and L5 dorsal ramus.  THere are corresponding facet degenerative changes at that level Will order PT  Given her cancer treatment she would like to revisit interventional procedures in ~41mo after PT.  Reassured pt that there was no sign of tumor in  The lumbar area.  Reviewed images with pt  She is on chronic warfarin and would need lovenox bridge

## 2017-04-25 ENCOUNTER — Telehealth: Payer: Self-pay | Admitting: Hematology

## 2017-04-25 NOTE — Telephone Encounter (Signed)
Patient called needed to know location of appointment for 3/5

## 2017-04-28 ENCOUNTER — Other Ambulatory Visit: Payer: Medicare Other | Admitting: *Deleted

## 2017-04-28 DIAGNOSIS — I480 Paroxysmal atrial fibrillation: Secondary | ICD-10-CM

## 2017-04-29 ENCOUNTER — Telehealth: Payer: Self-pay

## 2017-04-29 LAB — BASIC METABOLIC PANEL
BUN / CREAT RATIO: 17 (ref 12–28)
BUN: 19 mg/dL (ref 8–27)
CO2: 21 mmol/L (ref 20–29)
CREATININE: 1.14 mg/dL — AB (ref 0.57–1.00)
Calcium: 9.7 mg/dL (ref 8.7–10.3)
Chloride: 104 mmol/L (ref 96–106)
GFR calc Af Amer: 56 mL/min/{1.73_m2} — ABNORMAL LOW (ref >59)
GFR, EST NON AFRICAN AMERICAN: 48 mL/min/{1.73_m2} — AB (ref >59)
Glucose: 103 mg/dL — ABNORMAL HIGH (ref 65–99)
Potassium: 4.4 mmol/L (ref 3.5–5.2)
Sodium: 141 mmol/L (ref 134–144)

## 2017-04-29 NOTE — Telephone Encounter (Signed)
Call to schedule AWV Agreed to come in at 3:30 on April the 23 Will bring guestionaire and try to complete in 30 minutes. Completed AWV last year

## 2017-05-03 ENCOUNTER — Telehealth: Payer: Self-pay | Admitting: *Deleted

## 2017-05-03 ENCOUNTER — Ambulatory Visit
Admission: RE | Admit: 2017-05-03 | Discharge: 2017-05-03 | Disposition: A | Discharge status: Home / Self Care | Payer: Medicare Other | Source: Ambulatory Visit | Attending: Radiation Oncology | Admitting: Radiation Oncology

## 2017-05-03 ENCOUNTER — Encounter: Payer: Self-pay | Admitting: Radiation Oncology

## 2017-05-03 ENCOUNTER — Other Ambulatory Visit: Payer: Self-pay

## 2017-05-03 VITALS — BP 143/93 | HR 76 | Temp 97.5°F | Resp 20 | Ht 67.0 in | Wt 234.6 lb

## 2017-05-03 DIAGNOSIS — Z7982 Long term (current) use of aspirin: Secondary | ICD-10-CM | POA: Diagnosis not present

## 2017-05-03 DIAGNOSIS — M5126 Other intervertebral disc displacement, lumbar region: Secondary | ICD-10-CM | POA: Diagnosis not present

## 2017-05-03 DIAGNOSIS — N83202 Unspecified ovarian cyst, left side: Secondary | ICD-10-CM | POA: Insufficient documentation

## 2017-05-03 DIAGNOSIS — Z170 Estrogen receptor positive status [ER+]: Secondary | ICD-10-CM | POA: Diagnosis not present

## 2017-05-03 DIAGNOSIS — Z7901 Long term (current) use of anticoagulants: Secondary | ICD-10-CM | POA: Insufficient documentation

## 2017-05-03 DIAGNOSIS — Z79899 Other long term (current) drug therapy: Secondary | ICD-10-CM | POA: Diagnosis not present

## 2017-05-03 DIAGNOSIS — Z923 Personal history of irradiation: Secondary | ICD-10-CM | POA: Diagnosis not present

## 2017-05-03 DIAGNOSIS — C50412 Malignant neoplasm of upper-outer quadrant of left female breast: Secondary | ICD-10-CM | POA: Diagnosis not present

## 2017-05-03 DIAGNOSIS — M5127 Other intervertebral disc displacement, lumbosacral region: Secondary | ICD-10-CM | POA: Diagnosis not present

## 2017-05-03 DIAGNOSIS — M5136 Other intervertebral disc degeneration, lumbar region: Secondary | ICD-10-CM | POA: Insufficient documentation

## 2017-05-03 NOTE — Progress Notes (Signed)
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 This was delivered using a 3-D conformal technique. The patient then received a boost to the seroma. This delivered an additional 10 Gy in 4 fractions using a 3 field photon technique due to the depth of the seroma. The total dose was 52.56 Gy.   Narrative:  The patient returns today for routine follow-up. During treatment she did very well with radiotherapy and did not have significant desquamation.                             On review of systems, the patient states she is doing well.  She is not experiencing any concerns with her skin at this time.  She is continuing to use radial plaques and plans to switch over to vitamin E-based cream.  She states that she has been called in the prescription for tamoxifen, but has not yet started the medication.  She was not sure when she should start this.  No other complaints are verbalized.  ALLERGIES:  is allergic to lisinopril and tetanus toxoid adsorbed.  Meds: Current Outpatient Medications  Medication Sig Dispense Refill  . acetaminophen (TYLENOL) 500 MG tablet Take 1,000 mg by mouth daily as needed (PAIN).    Marland Kitchen alendronate (FOSAMAX) 70 MG tablet Take 1 tablet (70 mg total) by mouth every Saturday. with a full glass of water on an empty stomach. 13 tablet 3  . ALPRAZolam (XANAX) 0.25 MG tablet Take 1 tablet (0.25 mg total) by mouth at bedtime as needed for anxiety or sleep. 30 tablet 0  . aspirin EC 81 MG tablet Take 81 mg  daily by mouth.    Marland Kitchen atorvastatin (LIPITOR) 80 MG tablet Take 1 tablet (80 mg total) by mouth daily. 90 tablet 3  . budesonide-formoterol (SYMBICORT) 160-4.5 MCG/ACT inhaler Inhale 2 puffs into the lungs 2 (two) times daily. 1 Inhaler 0  . carvedilol (COREG) 25 MG tablet Take 1 tablet (25 mg total) by mouth 2 (two) times daily. 180 tablet 2  . Cholecalciferol (VITAMIN D3) 400 units tablet Take 1 tablet (400 Units total) by mouth daily. 30 tablet 0  . diclofenac sodium (VOLTAREN) 1 % GEL Apply 4 g topically 4 (four) times daily. 500 g 3  . diphenhydramine-acetaminophen (TYLENOL PM) 25-500 MG TABS tablet Take 2 tablets by mouth at bedtime as needed (SLEEP).     . furosemide (LASIX) 20 MG tablet Take 1 tablet (20 mg total) by mouth daily as needed for fluid or edema. (Patient taking differently: Take 20 mg daily by mouth. ) 90 tablet 1  . gabapentin (NEURONTIN) 100 MG capsule Take 1 capsule (100 mg total) by mouth 3 (three) times daily. 90 capsule 1  . losartan (COZAAR) 50 MG tablet Take 1 tablet by mouth daily 90 tablet 3  . ranitidine (ZANTAC) 150 MG tablet Take 1 tablet (150 mg total) by mouth 2 (two) times daily. 180 tablet 3  . senna-docusate (SENOKOT-S) 8.6-50 MG  tablet Take 2 tablets at bedtime by mouth.     . tamoxifen (NOLVADEX) 20 MG tablet Take 1 tablet (20 mg total) by mouth daily. 30 tablet 2  . traMADol (ULTRAM) 50 MG tablet Take 1 tablet (50 mg total) by mouth 3 (three) times daily. 90 tablet 5  . valACYclovir (VALTREX) 500 MG tablet Take 1 tablet (500 mg total) by mouth 2 (two) times daily. For 3 days when outbreaks. 24 tablet 0  . warfarin (COUMADIN) 3 MG tablet Take as directed by anticoagulation clinic. (Patient taking differently: Take 3 mg every evening by mouth. Take as directed by anticoagulation clinic.) 40 tablet 2  . methimazole (TAPAZOLE) 5 MG tablet TAKE 3 TABLETS BY MOUTH EVERY EVENING ( PATIENT NEEDS FOLLOW UP APPOINTMENT FOR FURTHER REFILLS) (Patient not taking: Reported  on 05/03/2017) 30 tablet 1   No current facility-administered medications for this encounter.     Physical Findings:  height is 5\' 7"  (1.702 m) and weight is 234 lb 9.6 oz (106.4 kg). Her oral temperature is 97.5 F (36.4 C) (abnormal). Her blood pressure is 143/93 (abnormal) and her pulse is 76. Her respiration is 20 and oxygen saturation is 99%.  Pain Assessment Pain Score: 3  Pain Loc: Breast(Breast left nipple)/10 In general this is a well appearing African American female in no acute distress. She's alert and oriented x4 and appropriate throughout the examination. Cardiopulmonary assessment is negative for acute distress and she exhibits normal effort. The left breast was examined and reveals mild hyperpigmentation without edema, chest wall fullness, or erythema. No desquamation is noted.   Lab Findings: Lab Results  Component Value Date   WBC 4.0 04/13/2017   HGB 13.5 04/13/2017   HCT 40.4 04/13/2017   MCV 96.5 04/13/2017   PLT 119.0 (L) 04/13/2017     Radiographic Findings: Mr Lumbar Spine Wo Contrast  Result Date: 04/06/2017 CLINICAL DATA:  Chronic bilateral low back pain with left-sided sciatica. EXAM: MRI LUMBAR SPINE WITHOUT CONTRAST TECHNIQUE: Multiplanar, multisequence MR imaging of the lumbar spine was performed. No intravenous contrast was administered. COMPARISON:  None. FINDINGS: Segmentation:  Standard based on the available coverage. Alignment:  Negative for listhesis. Vertebrae:  No fracture, evidence of discitis, or bone lesion. Conus medullaris and cauda equina: Conus extends to the L1-2 level. Conus and cauda equina appear normal. Paraspinal and other soft tissues: Tiny presumed cystic intensities in the right renal cortex. There is a partially covered left ovarian cyst measuring 3 cm. A left ovarian cyst with simple CT appearance was seen 08/19/2015; dimensions are stable from that study. Disc levels: T12- L1: Unremarkable. L1-L2: Mild disc narrowing and bulging.  No  impingement L2-L3: Moderate disc narrowing with mild bulging. No visible impingement L3-L4: Mild disc narrowing and circumferential bulging. No noted impingement. L4-L5: Severe degenerative disc narrowing with asymmetric left far-lateral ridging. Height loss and ridging causes moderate left foraminal stenosis. Patent canal and right foramen. Mild facet spurring. L5-S1:Disc narrowing and bulging with a shallow left paracentral protrusion. The left S1 nerve root is posteriorly displaced but there is no compression. Mild left foraminal narrowing. Mild facet spurring. IMPRESSION: 1. L4-5 advanced disc degeneration with disc collapse and ridging causing moderate left foraminal stenosis. 2. L5-S1 shallow left paracentral disc protrusion with noncompressive mass effect on the descending left S1 nerve root. 3. 3 cm left ovarian cyst, size stable from 2017 pelvis CT. Electronically Signed   By: Monte Fantasia M.D.   On: 04/06/2017 17:15    Impression/Plan: 1. Stage  IB,pT2N0M0, grade 2 ER/PR positive invasive ductal carcinoma of the left breast. The patient has been doing well since completion of radiotherapy. We discussed that we would be happy to continue to follow her as needed, but she will also continue to follow up with Dr. Burr Medico in medical oncology. She was counseled on skin care as well as measures to avoid sun exposure to this area.  2. Survivorship. The patient will be seen in survivorship clinic as per Dr. Ernestina Penna recommendations.     Carola Rhine, PAC

## 2017-05-03 NOTE — Telephone Encounter (Signed)
Spoke with pt and instructed pt to start Tamoxifen now as per Dr. Ernestina Penna instructions.  Pt stated she was not aware that she should start taking med in Feb after office visit.   Stated she will pick up prescription tomorrow and will start on  05/04/17.

## 2017-05-04 ENCOUNTER — Telehealth: Payer: Self-pay | Admitting: Family Medicine

## 2017-05-04 NOTE — Telephone Encounter (Signed)
Copied from [place] [id number]. Topic: General - Other >> May 04, 2017  9:34 AM Valla Leaver wrote: Reason for CRM: Patient was told to let Dr. [name] know that she is starting Tamoxifen for cancer treatment today. She has coumadin on Wednesday 03/13.

## 2017-05-04 NOTE — Telephone Encounter (Signed)
Spoke with pt and instructed pt re:  Per Dr. Burr Medico,  [place] for pt to take Tamoxifen while on Coumadin.  Instructed pt to notify her cardiologist Dr. [name] for close monitoring of PT/INR.   Pt voiced understanding.

## 2017-05-06 ENCOUNTER — Encounter: Payer: Self-pay | Admitting: Physical Therapy

## 2017-05-06 ENCOUNTER — Ambulatory Visit: Payer: Medicare Other | Attending: General Surgery | Admitting: Physical Therapy

## 2017-05-06 ENCOUNTER — Other Ambulatory Visit: Payer: Self-pay

## 2017-05-06 DIAGNOSIS — R262 Difficulty in walking, not elsewhere classified: Secondary | ICD-10-CM | POA: Diagnosis not present

## 2017-05-06 DIAGNOSIS — R293 Abnormal posture: Secondary | ICD-10-CM | POA: Diagnosis not present

## 2017-05-06 DIAGNOSIS — M6281 Muscle weakness (generalized): Secondary | ICD-10-CM | POA: Diagnosis not present

## 2017-05-06 NOTE — Patient Instructions (Signed)
   1. Walking as much as you can  2. Sit to stand at least 10 times, twice per day  3. Use your Cubii twice a day  4. Side-stepping along the counter  5. Windshield wipers

## 2017-05-06 NOTE — Therapy (Signed)
[facility] [address] [place], [place], [address] Phone: [phone]   Fax:  [phone]  Physical Therapy Evaluation  Patient Details  Name: Danielle [name] MRN: [id number] Date of Birth: 19-May-1945 Referring Provider: [name], MD   Encounter Date: 05/06/2017  PT End of Session - 05/06/17 1724    Visit Number  1    Number of Visits  9    Date for PT Re-Evaluation  07/05/17    Authorization Type  [employer] MCR    Authorization Time Period  05/06/17 to 07/05/17    PT Start Time  1532    PT Stop Time  1625    PT Time Calculation (min)  53 min    Equipment Utilized During Treatment  Gait belt    Activity Tolerance  Patient tolerated treatment well    Behavior During Therapy  [facility] for tasks assessed/performed       Past Medical History:  Diagnosis Date  . Allergy   . Anxiety   . Aortic stenosis    Status post bioprosthetic AVR  . Arthritis    DJD  . Asthma   . Breast cancer ([facility]) 12/13/2016   Left breast  . Chronic systolic CHF (congestive heart failure) ([place] [place])    EF 30% 04/2014  . COPD (chronic obstructive pulmonary disease) ([facility])   . Coronary artery disease    per pt, had LHC prior to AVR in [place] that did not show any blockages; no stents/bypass  . Gastritis   . Hyperlipidemia   . Hypertension   . Hyperthyroidism   . Multiple thyroid nodules   . Osteoporosis   . Paroxysmal atrial fibrillation (HCC)   . Pelvis fracture ([place]) 08/19/2015   MULTIPLE   . Spontaneous pneumothorax 11/28/2015   left   . Stroke [facility]) 2000   rt hand weak    Past Surgical History:  Procedure Laterality Date  . BREAST LUMPECTOMY WITH RADIOACTIVE SEED AND SENTINEL LYMPH NODE BIOPSY Left 01/14/2017   Procedure: LEFT BREAST LUMPECTOMY WITH RADIOACTIVE SEED AND SENTINEL LYMPH NODE BIOPSY;  Surgeon: Rolm Bookbinder, MD;  Location: [facility];  Service: General;  Laterality: Left;  . CHEST TUBE INSERTION   10/2015  . INTRAMEDULLARY (IM) NAIL INTERTROCHANTERIC Left 12/10/2015   Procedure: INTRAMEDULLARY (IM) NAIL INTERTROCHANTRIC;  Surgeon: Leandrew Koyanagi, MD;  Location: [facility];  Service: Orthopedics;  Laterality: Left;  . PLEURADESIS Left 12/03/2015   Procedure: PLEURADESIS;  Surgeon: Melrose Nakayama, MD;  Location: [facility];  Service: Thoracic;  Laterality: Left;  . RESECTION OF APICAL BLEB Left 12/03/2015   Procedure: BLEBECTOMY;  Surgeon: Melrose Nakayama, MD;  Location: [facility];  Service: Thoracic;  Laterality: Left;  . THORACOSCOPY  12/03/2015  . VALVE REPLACEMENT    . VIDEO ASSISTED THORACOSCOPY Left 12/03/2015   Procedure: VIDEO ASSISTED THORACOSCOPY;  Surgeon: Melrose Nakayama, MD;  Location: [facility];  Service: Thoracic;  Laterality: Left;    There were no vitals filed for this visit.   Subjective Assessment - 05/06/17 1549    Subjective  Patient reports she wants to walk better and have better balance. She has become fearful of falling since her fall 11/2015 which resulted in Lt hip surgery, time in SNF, and has used a RW since. She lost a lot of the strength and endurance she had regained when diagnosed with breast cancer and undergoing radiation.     Patient is accompained by:  Family member husband arrived at end of  session and discussed plan     Pertinent History  Arthritis, Left Breast Ca, CHF, COPD, HTN, osteoporosis, PAF, CVA (rt hand weak), 11/2015 fall with hip fracture     Limitations  Standing    How long can you stand comfortably?  "not long" that's what will realy make my back and down my left leg hurt    How long can you walk comfortably?  < 5 minutes    Patient Stated Goals  walk with cane; ultimately walk with no cane    Currently in Pain?  Yes    Pain Score  5     Pain Location  Leg    Pain Orientation  Left    Pain Descriptors / Indicators  Sharp    Pain Type  Chronic pain    Pain Onset  More than a month ago    Pain Frequency  Intermittent    Aggravating  Factors   standing    Pain Relieving Factors  sitting, medication         [facility] PT Assessment - 05/06/17 1541      Assessment   Medical Diagnosis   Peripheral musculoskeletal gait disorder     Referring Provider  [name], MD    Hand Dominance  Right    Prior Therapy  yes, SNF, HH after hip surgery      Precautions   Precautions  Other (comment)    Precaution Comments  ?interaction between coumadin and tamoxifen      Restrictions   Weight Bearing Restrictions  No      Balance Screen   Has the patient fallen in the past 6 months  No    Has the patient had a decrease in activity level because of a fear of falling?   Yes    Is the patient reluctant to leave their home because of a fear of falling?   Yes      [place] residence    Living Arrangements  Spouse/significant other    Available Help at Discharge  Family    Type of [place] to enter    Entrance Stairs-Number of Steps  [place]  One level    [place] - 2 wheels;Wheelchair - manual      Prior Function   Level of Independence  Independent with household mobility with device    Vocation  Retired Development worker, community    Leisure  read, walk in the house,       Cognition   Overall Cognitive Status  Within Functional Limits for tasks assessed      Coordination   Gross Motor Movements are Fluid and Coordinated  Yes    Fine Motor Movements are Fluid and Coordinated  No rapid toe tapping asynchronous      Posture/Postural Control   Posture/Postural Control  Postural limitations    Postural Limitations  Rounded Shoulders;Forward head      ROM / Strength   AROM / PROM / Strength  AROM;Strength      AROM   Overall AROM   Deficits    Overall AROM Comments  lt knee extension lacking 10 degrees in sitting (likely tight hamstrings)      Strength   Overall Strength  Deficits    Strength Assessment Site  Knee;Ankle     Right/Left Knee  Right;Left    Right Knee Extension  5/5    Left Knee Flexion  3+/5    Left Knee Extension  2+/5    Right/Left Ankle  Right;Left    Right Ankle Dorsiflexion  5/5    Left Ankle Dorsiflexion  5/5      Transfers   Transfers  Sit to Stand;Stand to Sit    Sit to Stand  6: Modified independent (Device/Increase time);With armrests    Five time sit to stand comments   26.7 13.0 sec     Stand to Sit  6: Modified independent (Device/Increase time);With armrests      Ambulation/Gait   Ambulation/Gait  Yes    Ambulation/Gait Assistance  6: Modified independent (Device/Increase time)    Ambulation Distance (Feet)  115 Feet    Assistive device  Rolling walker    Gait Pattern  Step-through pattern;Decreased step length - right;Left flexed knee in stance    Ambulation Surface  Indoor    Gait velocity  20/13.47=1.48 t/sec 2.79 norm    Gait Comments  mild shortness of breath (able to talk while walking; HR after ambulation 90 bpm             Objective measurements completed on examination: See above findings.     Treatment- Established initial HEP (see pt instructions) based on equipment she has available and her current strength/balance needs         PT Education - 05/06/17 1723    Education provided  Yes    Education Details  results of PT evaluation; PT POC; exercises she can continue or start at home    Person(s) Educated  Patient    Methods  Explanation;Handout    Comprehension  Verbalized understanding;Need further instruction       PT Short Term Goals - 05/06/17 1739      PT SHORT TERM GOAL #1   Title  Patient will be independent with her HEP addressing bil LE weakness and balance deficits. (Target for all STGs 06/05/17)    Time  4    Period  Weeks    Status  New    Target Date  06/05/17      PT SHORT TERM GOAL #2   Title  Patient will ambulate 115 ft with cane and minguard assist.     Time  4    Period  Weeks    Status  New      PT SHORT TERM  GOAL #3   Title  Patient will improve 5x sit to stand to <=22 seconds to demonstrate improved LE strength and basic balance.     Baseline  26.7 sec    Time  4    Period  Weeks    Status  New      PT SHORT TERM GOAL #4   Title  Patient will improve gait velocity to 1.81 ft/sec or more with LRAD to demonstrate decreasing fall risk and progress towards norm for her age (18.79 ft/sec)    Baseline  1.48 ft/sec    Time  4    Period  Weeks    Status  New        PT Long Term Goals - 05/06/17 1744      PT LONG TERM GOAL #1   Title  Patient will verbalize a plan for transition to community-based exercise program upon discharge from PT. (Target for all LTGs 07/05/17)    Time  8    Period  Weeks    Status  New    Target  Date  07/05/17      PT LONG TERM GOAL #2   Title  Patient will ambulate 115 ft with cane modified independent on level indoor terrain.    Time  8    Period  Weeks    Status  New      PT LONG TERM GOAL #3   Title  Pateint will improve 5x sit to stand to <=20 seconds to demonstrate decreasing fall risk and improving LE strength.    Time  8    Period  Weeks    Status  New      PT LONG TERM GOAL #4   Title  Patient will improve gait velocity to >=2.0 ft/sec demonstrating improved safety for limited community ambuation and closer to norm for her age of 2.79 ft/sec    Time  8    Period  Weeks    Status  New             Plan - 05/06/17 1725    Clinical Impression Statement  Patient referred for OPPT evaluation due to peripheral musculoskeletal gait disorder. Patient has had difficulty with gait since she fell and broke her Lt femur 12/16/15. Since that time she slowly has regained ability to use a RW for ambulation inside her home (she went to SNF and briefly had [place], and then was diagnosed with breast cancer and has lost strength during this time). She has resumed part of her home program from before the cancer diagnosis and even purchased and began using a seated  elliptical machine ([employer]). She appears motivated and wants to improve her confidence with her walking and balance. Anticipate she can benefit from the PT interventions listed below to address the deficits listed below. She would benefit from 2 times/week, however cannot financially afford that frequency.     History and Personal Factors relevant to plan of care:  PMH-Arthritis, Left Breast Ca, CHF, COPD, HTN, osteoporosis, PAF, CVA (rt hand weak), fall with hip fracture and repair 11/2015    Clinical Presentation  Stable    Clinical Presentation due to:  she has completed her radiation treatments for breast cancer and is feeling better and ready to regain her strength    Rehab Potential  Good    Clinical Impairments Affecting Rehab Potential  Multiple comorbidities    PT Frequency  1x / week    PT Duration  8 weeks    PT Treatment/Interventions  ADLs/Self Care Home Management;Aquatic Therapy;DME Instruction;Gait training;Balance training;Therapeutic exercise;Therapeutic activities;Functional mobility training;Stair training;Patient/family education;Passive range of motion;Manual techniques    PT Next Visit Plan  See how she is doing with HEP (determined on eval); try walking in // bars with little/light UE support to increase her confidence. She wants to try to use a treadmill. Static standing increases back pain, therefore avoid this position for ex's     Consulted and Agree with Plan of Care  Patient;Family member/caregiver    Family Member Consulted  Husband, [place]       Patient will benefit from skilled therapeutic intervention in order to improve the following deficits and impairments:  Abnormal gait, Decreased activity tolerance, Decreased balance, Decreased mobility, Decreased knowledge of use of DME, Decreased endurance, Decreased coordination, Decreased range of motion, Decreased strength, Impaired flexibility, Impaired UE functional use, Postural dysfunction, Obesity, Pain(Pain was not  an issue on evaluation, however she reports recent back and LLE pain. PT will not directly address pain as part of the POC, however will monitor for pain  during sessions. )  Visit Diagnosis: Abnormal posture - Plan: PT plan of care cert/re-cert  Difficulty in walking, not elsewhere classified - Plan: PT plan of care cert/re-cert  Muscle weakness (generalized) - Plan: PT plan of care cert/re-cert     Problem List Patient Active Problem List   Diagnosis Date Noted  . Recurrent genital herpes 04/13/2017  . Chronic pain disorder 01/25/2017  . Malignant neoplasm of upper-outer quadrant of left breast in female, estrogen receptor positive ([place]) 12/16/2016  . Chronic knee pain 12/07/2016  . Chronic bilateral low back pain without sciatica 12/07/2016  . Peripheral musculoskeletal gait disorder 12/07/2016  . Encounter for therapeutic drug monitoring 06/16/2016  . Generalized osteoarthritis of multiple sites 05/04/2016  . Chronic anticoagulation 05/04/2016  . Stroke ([place]) 04/14/2016  . Aortic valve disease 04/10/2016  . S/P AVR 04/10/2016  . NICM (nonischemic cardiomyopathy) ([place]) 04/10/2016  . Upper airway cough syndrome 03/14/2016  . Morbid obesity due to excess calories ([place]) 03/14/2016  . COPD GOLD 0  03/11/2016  . Thrombocytopenia ([place]) 12/12/2015  . Anemia 12/12/2015  . Vitamin D deficiency 12/12/2015  . Acute urinary retention 12/11/2015  . Osteoporosis 12/11/2015  . Hip fracture ([place]) 12/10/2015  . Aortic atherosclerosis ([place]) 12/10/2015  . Lung blebs ([place]) 12/03/2015  . Pelvic fracture ([place]) 08/19/2015  . Fall at home 08/19/2015  . Essential hypertension 08/19/2015  . Hyperlipidemia 08/19/2015  . Asthma 08/19/2015  . Atrial fibrillation ([place]) 08/19/2015  . Hyperthyroidism 08/19/2015  . Chronic combined systolic and diastolic CHF (congestive heart failure) ([place])   . 3-vessel CAD   . Aortic stenosis     Rexanne Mano , PT 05/06/2017, 5:55 PM  [facility] [address] [place], [place], [address] Phone: [phone]   Fax:  [phone]  Name: Nailyn Dearinger MRN: [id number] Date of Birth: Jun 11, 1945

## 2017-05-10 ENCOUNTER — Encounter: Payer: Self-pay | Admitting: Physical Therapy

## 2017-05-10 ENCOUNTER — Ambulatory Visit: Payer: Medicare Other | Admitting: Physical Therapy

## 2017-05-10 DIAGNOSIS — M6281 Muscle weakness (generalized): Secondary | ICD-10-CM

## 2017-05-10 DIAGNOSIS — R293 Abnormal posture: Secondary | ICD-10-CM | POA: Diagnosis not present

## 2017-05-10 DIAGNOSIS — R262 Difficulty in walking, not elsewhere classified: Secondary | ICD-10-CM

## 2017-05-10 NOTE — Therapy (Signed)
[facility] [address] [place], [place], [address] Phone: [phone]   Fax:  [phone]  Physical Therapy Treatment  Patient Details  Name: Danielle [name] MRN: [id number] Date of Birth: March 30, 1945 Referring Provider: [name], MD   Encounter Date: 05/10/2017  PT End of Session - 05/10/17 1500    Visit Number  2    Number of Visits  9    Date for PT Re-Evaluation  07/05/17    Authorization Type  [employer] MCR    Authorization Time Period  05/06/17 to 07/05/17    PT Start Time  1400    PT Stop Time  1450    PT Time Calculation (min)  50 min    Equipment Utilized During Treatment  Gait belt    Activity Tolerance  Patient tolerated treatment well    Behavior During Therapy  [facility] for tasks assessed/performed       Past Medical History:  Diagnosis Date  . Allergy   . Anxiety   . Aortic stenosis    Status post bioprosthetic AVR  . Arthritis    DJD  . Asthma   . Breast cancer ([place]) 12/13/2016   Left breast  . Chronic systolic CHF (congestive heart failure) ([place])    EF 30% 04/2014  . COPD (chronic obstructive pulmonary disease) ([place])   . Coronary artery disease    per pt, had LHC prior to AVR in [place] that did not show any blockages; no stents/bypass  . Gastritis   . Hyperlipidemia   . Hypertension   . Hyperthyroidism   . Multiple thyroid nodules   . Osteoporosis   . Paroxysmal atrial fibrillation (H[facility])   . Pelvis fracture ([place]) 08/19/2015   MULTIPLE   . Spontaneous pneumothorax 11/28/2015   left   . Stroke [facility]) 2000   rt hand weak    Past Surgical History:  Procedure Laterality Date  . BREAST LUMPECTOMY WITH RADIOACTIVE SEED AND SENTINEL LYMPH NODE BIOPSY Left 01/14/2017   Procedure: LEFT BREAST LUMPECTOMY WITH RADIOACTIVE SEED AND SENTINEL LYMPH NODE BIOPSY;  Surgeon: Rolm Bookbinder, MD;  Location: [facility];  Service: General;  Laterality: Left;  . CHEST TUBE INSERTION   10/2015  . INTRAMEDULLARY (IM) NAIL INTERTROCHANTERIC Left 12/10/2015   Procedure: INTRAMEDULLARY (IM) NAIL INTERTROCHANTRIC;  Surgeon: Leandrew Koyanagi, MD;  Location: [facility];  Service: Orthopedics;  Laterality: Left;  . PLEURADESIS Left 12/03/2015   Procedure: PLEURADESIS;  Surgeon: Melrose Nakayama, MD;  Location: [facility];  Service: Thoracic;  Laterality: Left;  . RESECTION OF APICAL BLEB Left 12/03/2015   Procedure: BLEBECTOMY;  Surgeon: Melrose Nakayama, MD;  Location: [facility];  Service: Thoracic;  Laterality: Left;  . THORACOSCOPY  12/03/2015  . VALVE REPLACEMENT    . VIDEO ASSISTED THORACOSCOPY Left 12/03/2015   Procedure: VIDEO ASSISTED THORACOSCOPY;  Surgeon: Melrose Nakayama, MD;  Location: [facility];  Service: Thoracic;  Laterality: Left;    There were no vitals filed for this visit.  Subjective Assessment - 05/10/17 1359    Subjective  Reports she has been doing her exercises at home (sit to stand, walking, sidestepping, "windshield wipers" in recliner, seated stepper/cubii). Proud of herself for using SC[employer] to get to her appointment today and she walked into clinic lobby from [place] by herself!    Patient is accompained by:  Family member husband arrived at end of session and discussed plan     Pertinent History  Arthritis, Left Breast Ca, CHF,  COPD, HTN, osteoporosis, PAF, CVA (rt hand weak), 11/2015 fall with hip fracture     Limitations  Standing    How long can you stand comfortably?  "not long" that's what will realy make my back and down my left leg hurt    How long can you walk comfortably?  < 5 minutes    Patient Stated Goals  walk with cane; ultimately walk with no cane    Currently in Pain?  No/denies    Pain Onset  --                      [facility] Adult PT Treatment/Exercise - 05/10/17 1509      Transfers   Transfers  Sit to Stand;Stand to Sit    Sit to Stand  6: Modified independent (Device/Increase time);With armrests    Stand to Sit  6: Modified  independent (Device/Increase time);With armrests      Ambulation/Gait   Ambulation/Gait  Yes    Ambulation/Gait Assistance  6: Modified independent (Device/Increase time);4: Min guard    Ambulation Distance (Feet)  115 Feet 65, 60 x3, 75    Assistive device  Rolling walker;Straight cane;Parallel bars    Gait Pattern  Step-through pattern;Decreased step length - right;Step-to pattern;Trunk flexed    Ambulation Surface  Indoor    Gait Comments  mild shortness of breath; progressed from RW to // bars bil UE support to single UE support to one bar and cane; then progressed to counter and cane      Posture/Postural Control   Posture/Postural Control  Postural limitations    Postural Limitations  Rounded Shoulders;Forward head    Posture Comments  frequent cues while walking for upright posture; noted RW set 2 inches too tall for her and explained how this contributes to RW ending up too far ahead; she agreed to have it lowered and did well with the change             PT Education - 05/10/17 1458    Education provided  Yes    Education Details  Walk her "lap" inside her home x 3 while timing herself. Whatever time that is (ex. 7 minutes) begin doing that 2x per day and twice per week try to add one minute (or maybe 30 seconds) to each walk; attempted use of numbered cards 1,2,3 placed at RUE, LLE, RLE while seated to practice her sequencing when she uses a cane. Even with 1, 2, 3 or "cane, left, right" written on cards, she could not maintain the proper sequence. Attempted to stay in sync with PT as she demonstrated and was unable to keep proper sequence.     Person(s) Educated  Patient    Methods  Explanation;Demonstration;Tactile cues;Verbal cues;Handout    Comprehension  Verbalized understanding;Verbal cues required;Tactile cues required;Need further instruction       PT Short Term Goals - 05/06/17 1739      PT SHORT TERM GOAL #1   Title  Patient will be independent with her HEP  addressing bil LE weakness and balance deficits. (Target for all STGs 06/05/17)    Time  4    Period  Weeks    Status  New    Target Date  06/05/17      PT SHORT TERM GOAL #2   Title  Patient will ambulate 115 ft with cane and minguard assist.     Time  4    Period  Weeks    Status  New      PT SHORT TERM GOAL #3   Title  Patient will improve 5x sit to stand to <=22 seconds to demonstrate improved LE strength and basic balance.     Baseline  26.7 sec    Time  4    Period  Weeks    Status  New      PT SHORT TERM GOAL #4   Title  Patient will improve gait velocity to 1.81 ft/sec or more with LRAD to demonstrate decreasing fall risk and progress towards norm for her age (34.79 ft/sec)    Baseline  1.48 ft/sec    Time  4    Period  Weeks    Status  New        PT Long Term Goals - 05/06/17 1744      PT LONG TERM GOAL #1   Title  Patient will verbalize a plan for transition to community-based exercise program upon discharge from PT. (Target for all LTGs 07/05/17)    Time  8    Period  Weeks    Status  New    Target Date  07/05/17      PT LONG TERM GOAL #2   Title  Patient will ambulate 115 ft with cane modified independent on level indoor terrain.    Time  8    Period  Weeks    Status  New      PT LONG TERM GOAL #3   Title  Pateint will improve 5x sit to stand to <=20 seconds to demonstrate decreasing fall risk and improving LE strength.    Time  8    Period  Weeks    Status  New      PT LONG TERM GOAL #4   Title  Patient will improve gait velocity to >=2.0 ft/sec demonstrating improved safety for limited community ambuation and closer to norm for her age of 2.79 ft/sec    Time  8    Period  Weeks    Status  New        Long Term Clinic Goals - 02/14/17 1600      CC Long Term Goal  #1   Title  Patient will be independent in initial home exercise program    Time  4    Period  Weeks    Status  Achieved      CC Long Term Goal  #2   Title  Patient will report she  is able to stand >/= 5 minutes to do short kitchen tasks.    Time  4    Period  Weeks    Status  Achieved      CC Long Term Goal  #3   Title  Patient will report she is able to walk to her mailbox and back without stopping due to fatigue to improve function.    Time  4    Period  Weeks    Status  Not Met      CC Long Term Goal  #4   Title  Patient will increase left shoulder active abduction to >/= 130 degrees in supine to tolerate radiation positioning.    Time  4    Period  Weeks    Status  Not Met      CC Long Term Goal  #5   Title  Patient will be able to perform sit to stand from a standard chair without use of UEs.    Time  4  Period  Weeks    Status  Not Met         Plan - 05/10/17 1502    Clinical Impression Statement  Session included gait training and preparation for using a cane (practicing sequencing while seated). Ultimately patient able to walk along the kitchen counter with left hand on counter and cane in right hand and minguard assist for safety. She is clearly very motivated and doing her HEP. She will continue to benefit from PT     Rehab Potential  Good    Clinical Impairments Affecting Rehab Potential  Multiple comorbidities    PT Frequency  1x / week    PT Duration  8 weeks    PT Treatment/Interventions  ADLs/Self Care Home Management;Aquatic Therapy;DME Instruction;Gait training;Balance training;Therapeutic exercise;Therapeutic activities;Functional mobility training;Stair training;Patient/family education;Passive range of motion;Manual techniques    PT Next Visit Plan  walking from lobby with VERY light support on bil UEs on RW; ? warm-up with cane at counter (right hand with cane, left slide along counter, then walk backwards to start and repeat; ? progress to walk with cane on right and HHA on left.  She wants to try to use a treadmill (when approp) Static standing increases back pain, therefore avoid this position for ex's     Consulted and Agree with  Plan of Care  Patient;Family member/caregiver    Family Member Consulted  Husband, Klemme       Patient will benefit from skilled therapeutic intervention in order to improve the following deficits and impairments:  Abnormal gait, Decreased activity tolerance, Decreased balance, Decreased mobility, Decreased knowledge of use of DME, Decreased endurance, Decreased coordination, Decreased range of motion, Decreased strength, Impaired flexibility, Impaired UE functional use, Postural dysfunction, Obesity, Pain(Pain was not an issue on evaluation, however she reports recent back and LLE pain. PT will not directly address pain as part of the POC, however will monitor for pain during sessions. )  Visit Diagnosis: Difficulty in walking, not elsewhere classified  Muscle weakness (generalized)     Problem List Patient Active Problem List   Diagnosis Date Noted  . Recurrent genital herpes 04/13/2017  . Chronic pain disorder 01/25/2017  . Malignant neoplasm of upper-outer quadrant of left breast in female, estrogen receptor positive ([place]) 12/16/2016  . Chronic knee pain 12/07/2016  . Chronic bilateral low back pain without sciatica 12/07/2016  . Peripheral musculoskeletal gait disorder 12/07/2016  . Encounter for therapeutic drug monitoring 06/16/2016  . Generalized osteoarthritis of multiple sites 05/04/2016  . Chronic anticoagulation 05/04/2016  . Stroke ([place]) 04/14/2016  . Aortic valve disease 04/10/2016  . S/P AVR 04/10/2016  . NICM (nonischemic cardiomyopathy) ([place]) 04/10/2016  . Upper airway cough syndrome 03/14/2016  . Morbid obesity due to excess calories ([place]) 03/14/2016  . COPD GOLD 0  03/11/2016  . Thrombocytopenia ([place]) 12/12/2015  . Anemia 12/12/2015  . Vitamin D deficiency 12/12/2015  . Acute urinary retention 12/11/2015  . Osteoporosis 12/11/2015  . Hip fracture ([place]) 12/10/2015  . Aortic atherosclerosis ([place]) 12/10/2015  . Lung blebs ([place]) 12/03/2015  . Pelvic  fracture ([place]) 08/19/2015  . Fall at home 08/19/2015  . Essential hypertension 08/19/2015  . Hyperlipidemia 08/19/2015  . Asthma 08/19/2015  . Atrial fibrillation ([place]) 08/19/2015  . Hyperthyroidism 08/19/2015  . Chronic combined systolic and diastolic CHF (congestive heart failure) ([place])   . 3-vessel CAD   . Aortic stenosis     Rexanne Mano, PT 05/10/2017, 3:14 PM  [facility]  [address] [place], [place], [address] Phone: [phone]   Fax:  [phone]  Name: Jenalee Trevizo MRN: [id number] Date of Birth: [birth year]

## 2017-05-11 ENCOUNTER — Ambulatory Visit (INDEPENDENT_AMBULATORY_CARE_PROVIDER_SITE_OTHER): Payer: Medicare Other | Admitting: General Practice

## 2017-05-11 DIAGNOSIS — Z7901 Long term (current) use of anticoagulants: Secondary | ICD-10-CM | POA: Diagnosis not present

## 2017-05-11 LAB — POCT INR: INR: 1.9

## 2017-05-11 NOTE — Patient Instructions (Addendum)
Pre visit review using our clinic review tool, if applicable. No additional management support is needed unless otherwise documented below in the visit note.  Take 2 tablets today (3/13) and then continue to take 1 tablet daily except 1 1/2 tablets on Wednesdays.  Re-check in 4 weeks. (Use only 3 mg tablets only).

## 2017-05-13 ENCOUNTER — Telehealth: Payer: Self-pay

## 2017-05-13 NOTE — Telephone Encounter (Signed)
Called pt's pharmacy and they said if the pt gets a letter, she is to bring the letter into the pharmacy so they can compare lot #'s. If its recalled, they will refill it with a non recalled lot #.  Pt stated her letter's lot# and her bottle's lot# are different, but I advised her to bring the medication in for a double check. She stated she would and had no additional questions.

## 2017-05-17 NOTE — Progress Notes (Signed)
I have reviewed documentation from this visit and I agree with recommendations given.  [name]. [name], MD  [facility]. [place] office.   

## 2017-05-23 ENCOUNTER — Encounter: Payer: Self-pay | Admitting: Physical Therapy

## 2017-05-23 ENCOUNTER — Ambulatory Visit: Payer: Medicare Other | Admitting: Physical Therapy

## 2017-05-23 DIAGNOSIS — M6281 Muscle weakness (generalized): Secondary | ICD-10-CM

## 2017-05-23 DIAGNOSIS — R293 Abnormal posture: Secondary | ICD-10-CM | POA: Diagnosis not present

## 2017-05-23 DIAGNOSIS — R262 Difficulty in walking, not elsewhere classified: Secondary | ICD-10-CM | POA: Diagnosis not present

## 2017-05-23 NOTE — Patient Instructions (Signed)
Bridge    Use your gripper socks. Lie back, legs bent. Tighten your stomach and lift your hips up 2-3 inches. Count out loud to 3. Then slowly lower.  Repeat __10__ times. Do __2__ sets.   http://pm.exer.us/55   Copyright  VHI. All rights reserved.   Heel Raises    Stand with light support at counter. With knees straight, raise heels off ground. Hold __3_ seconds.  Repeat _15__ times. Do __2_ times a day.  Copyright  VHI. All rights reserved.    ANKLE: Dorsiflexion - Standing    Stand with upright posture with light support of hands on counter. Raise toes of one foot up at a time. Alternate right and left __20_ reps per set, __2_ sets per day, Copyright  VHI. All rights reserved.

## 2017-05-23 NOTE — Therapy (Signed)
[facility] [address] [place], [place], [address] Phone: [phone]   Fax:  [phone]  Physical Therapy Treatment  Patient Details  Name: Danielle [name] MRN: [id number] Date of Birth: November 17, 1945 Referring Provider: [name], MD   Encounter Date: 05/23/2017  PT End of Session - 05/23/17 1631    Visit Number  3    Number of Visits  9    Date for PT Re-Evaluation  07/05/17    Authorization Type  UHC MCR    Authorization Time Period  05/06/17 to 07/05/17    PT Start Time  1530    PT Stop Time  1615    PT Time Calculation (min)  45 min    Equipment Utilized During Treatment  Gait belt    Activity Tolerance  Patient tolerated treatment well    Behavior During Therapy  [facility] for tasks assessed/performed       Past Medical History:  Diagnosis Date  . Allergy   . Anxiety   . Aortic stenosis    Status post bioprosthetic AVR  . Arthritis    DJD  . Asthma   . Breast cancer ([place]) 12/13/2016   Left breast  . Chronic systolic CHF (congestive heart failure) ([place])    EF 30% 04/2014  . COPD (chronic obstructive pulmonary disease) ([place])   . Coronary artery disease    per pt, had LHC prior to AVR in [place] that did not show any blockages; no stents/bypass  . Gastritis   . Hyperlipidemia   . Hypertension   . Hyperthyroidism   . Multiple thyroid nodules   . Osteoporosis   . Paroxysmal atrial fibrillation (HCC)   . Pelvis fracture ([place]) 08/19/2015   MULTIPLE   . Spontaneous pneumothorax 11/28/2015   left   . Stroke [facility]) 2000   rt hand weak    Past Surgical History:  Procedure Laterality Date  . BREAST LUMPECTOMY WITH RADIOACTIVE SEED AND SENTINEL LYMPH NODE BIOPSY Left 01/14/2017   Procedure: LEFT BREAST LUMPECTOMY WITH RADIOACTIVE SEED AND SENTINEL LYMPH NODE BIOPSY;  Surgeon: Rolm Bookbinder, MD;  Location: [place];  Service: General;  Laterality: Left;  . CHEST TUBE INSERTION   10/2015  . INTRAMEDULLARY (IM) NAIL INTERTROCHANTERIC Left 12/10/2015   Procedure: INTRAMEDULLARY (IM) NAIL INTERTROCHANTRIC;  Surgeon: Leandrew Koyanagi, MD;  Location: [place];  Service: Orthopedics;  Laterality: Left;  . PLEURADESIS Left 12/03/2015   Procedure: PLEURADESIS;  Surgeon: Melrose Nakayama, MD;  Location: [place];  Service: Thoracic;  Laterality: Left;  . RESECTION OF APICAL BLEB Left 12/03/2015   Procedure: BLEBECTOMY;  Surgeon: Melrose Nakayama, MD;  Location: [place];  Service: Thoracic;  Laterality: Left;  . THORACOSCOPY  12/03/2015  . VALVE REPLACEMENT    . VIDEO ASSISTED THORACOSCOPY Left 12/03/2015   Procedure: VIDEO ASSISTED THORACOSCOPY;  Surgeon: Melrose Nakayama, MD;  Location: [place];  Service: Thoracic;  Laterality: Left;    There were no vitals filed for this visit.  Subjective Assessment - 05/23/17 1529    Subjective  Was gone to [facility] for 1 week and did great! Husband praised her!    Patient is accompained by:  Family member husband arrived at end of session and discussed plan     Pertinent History  Arthritis, Left Breast Ca, CHF, COPD, HTN, osteoporosis, PAF, CVA (rt hand weak), 11/2015 fall with hip fracture     Limitations  Standing    How long can you stand comfortably?  "  not long" that's what will realy make my back and down my left leg hurt    How long can you walk comfortably?  < 5 minutes    Patient Stated Goals  walk with cane; ultimately walk with no cane    Currently in Pain?  No/denies                       [facility] Adult PT Treatment/Exercise - 05/23/17 1926      Transfers   Transfers  Sit to Stand;Stand to Sit    Sit to Stand  6: Modified independent (Device/Increase time);Without upper extremity assist    Number of Reps  10 reps;1 set      Ambulation/Gait   Ambulation/Gait  Yes    Ambulation/Gait Assistance  4: Min guard;4: Min assist    Ambulation/Gait Assistance Details  min assist when walking with cane or walking without  device; vc for upright posture    Ambulation Distance (Feet)  100 Feet 40, 82, 120, plus treadmill 0.1 miles    Assistive device  Rolling walker;Straight cane;Parallel bars;1 person hand held assist    Gait Pattern  Step-through pattern;Decreased step length - right;Step-to pattern;Trunk flexed;Decreased dorsiflexion - right;Decreased dorsiflexion - left;Right foot flat;Left foot flat    Ambulation Surface  Indoor      Exercises   Exercises  Knee/Hip      Knee/Hip Exercises: Aerobic   Tread Mill  0.69mph x 0.1 mi with bil UE support       Knee/Hip Exercises: Standing   Heel Raises  Both;1 set;15 reps    Hip Abduction  Stengthening;Both;2 sets;20 reps    Other Standing Knee Exercises  walking backwards light UE assist in // bars to no UE assist        Supine bridging with 3 second hold, 10 reps x 2 sets        PT Education - 05/23/17 1629    Education Details  Correct posture/use of RW and cane; treadmill at gyms typically start at 1.0 mph so not yet ready to try that on her own; additions to HEP    Person(s) Educated  Patient    Methods  Explanation;Demonstration;Verbal cues;Handout    Comprehension  Verbalized understanding;Returned demonstration       PT Short Term Goals - 05/06/17 1739      PT SHORT TERM GOAL #1   Title  Patient will be independent with her HEP addressing bil LE weakness and balance deficits. (Target for all STGs 06/05/17)    Time  4    Period  Weeks    Status  New    Target Date  06/05/17      PT SHORT TERM GOAL #2   Title  Patient will ambulate 115 ft with cane and minguard assist.     Time  4    Period  Weeks    Status  New      PT SHORT TERM GOAL #3   Title  Patient will improve 5x sit to stand to <=22 seconds to demonstrate improved LE strength and basic balance.     Baseline  26.7 sec    Time  4    Period  Weeks    Status  New      PT SHORT TERM GOAL #4   Title  Patient will improve gait velocity to 1.81 ft/sec or more with LRAD to  demonstrate decreasing fall risk and progress towards norm for her age (47.79  ft/sec)    Baseline  1.48 ft/sec    Time  4    Period  Weeks    Status  New        PT Long Term Goals - 05/06/17 1744      PT LONG TERM GOAL #1   Title  Patient will verbalize a plan for transition to community-based exercise program upon discharge from PT. (Target for all LTGs 07/05/17)    Time  8    Period  Weeks    Status  New    Target Date  07/05/17      PT LONG TERM GOAL #2   Title  Patient will ambulate 115 ft with cane modified independent on level indoor terrain.    Time  8    Period  Weeks    Status  New      PT LONG TERM GOAL #3   Title  Pateint will improve 5x sit to stand to <=20 seconds to demonstrate decreasing fall risk and improving LE strength.    Time  8    Period  Weeks    Status  New      PT LONG TERM GOAL #4   Title  Patient will improve gait velocity to >=2.0 ft/sec demonstrating improved safety for limited community ambuation and closer to norm for her age of 2.79 ft/sec    Time  8    Period  Weeks    Status  New            Plan - 05/23/17 1633    Clinical Impression Statement  Session focused on LE strengthening, balance, proper use of RW vs cane, and gait training. She remains highly motivated and continues to improved her tolerance for activity. She will continue to benefit from PT.     Rehab Potential  Good    Clinical Impairments Affecting Rehab Potential  Multiple comorbidities    PT Frequency  1x / week    PT Duration  8 weeks    PT Treatment/Interventions  ADLs/Self Care Home Management;Aquatic Therapy;DME Instruction;Gait training;Balance training;Therapeutic exercise;Therapeutic activities;Functional mobility training;Stair training;Patient/family education;Passive range of motion;Manual techniques    PT Next Visit Plan  check technique HEP given 3/25;  She wants to try to use a treadmill (when approp) Static standing increases back pain, therefore avoid this  position for ex's     Consulted and Agree with Plan of Care  Patient;Family member/caregiver    Family Member Consulted  Husband, [place]       Patient will benefit from skilled therapeutic intervention in order to improve the following deficits and impairments:  Abnormal gait, Decreased activity tolerance, Decreased balance, Decreased mobility, Decreased knowledge of use of DME, Decreased endurance, Decreased coordination, Decreased range of motion, Decreased strength, Impaired flexibility, Impaired UE functional use, Postural dysfunction, Obesity, Pain(Pain was not an issue on evaluation, however she reports recent back and LLE pain. PT will not directly address pain as part of the POC, however will monitor for pain during sessions. )  Visit Diagnosis: Muscle weakness (generalized)  Abnormal posture  Difficulty in walking, not elsewhere classified     Problem List Patient Active Problem List   Diagnosis Date Noted  . Recurrent genital herpes 04/13/2017  . Chronic pain disorder 01/25/2017  . Malignant neoplasm of upper-outer quadrant of left breast in female, estrogen receptor positive ([place]) 12/16/2016  . Chronic knee pain 12/07/2016  . Chronic bilateral low back pain without sciatica 12/07/2016  . Peripheral musculoskeletal gait disorder  12/07/2016  . Encounter for therapeutic drug monitoring 06/16/2016  . Generalized osteoarthritis of multiple sites 05/04/2016  . Chronic anticoagulation 05/04/2016  . Stroke ([place]) 04/14/2016  . Aortic valve disease 04/10/2016  . S/P AVR 04/10/2016  . NICM (nonischemic cardiomyopathy) ([place]) 04/10/2016  . Upper airway cough syndrome 03/14/2016  . Morbid obesity due to excess calories ([place]) 03/14/2016  . COPD GOLD 0  03/11/2016  . Thrombocytopenia ([place]) 12/12/2015  . Anemia 12/12/2015  . Vitamin D deficiency 12/12/2015  . Acute urinary retention 12/11/2015  . Osteoporosis 12/11/2015  . Hip fracture ([place]) 12/10/2015  . Aortic atherosclerosis  ([place]) 12/10/2015  . Lung blebs ([place]) 12/03/2015  . Pelvic fracture ([place]) 08/19/2015  . Fall at home 08/19/2015  . Essential hypertension 08/19/2015  . Hyperlipidemia 08/19/2015  . Asthma 08/19/2015  . Atrial fibrillation ([place]) 08/19/2015  . Hyperthyroidism 08/19/2015  . Chronic combined systolic and diastolic CHF (congestive heart failure) ([place])   . 3-vessel CAD   . Aortic stenosis     Rexanne Mano, PT 05/23/2017, 7:44 PM  [facility] [address] [place], [place], [address] Phone: [phone]   Fax:  [phone]  Name: Danielle [name] MRN: [id number] Date of Birth: 12/01/1945

## 2017-05-27 ENCOUNTER — Telehealth: Payer: Self-pay | Admitting: Family Medicine

## 2017-05-27 ENCOUNTER — Other Ambulatory Visit: Payer: Self-pay | Admitting: Family Medicine

## 2017-05-27 DIAGNOSIS — I480 Paroxysmal atrial fibrillation: Secondary | ICD-10-CM

## 2017-05-27 NOTE — Telephone Encounter (Signed)
Copied from [place] [id number]. Topic: General - Other >> May 27, 2017  2:35 PM Carolyn Stare wrote:  Pt call to ask if Dr [name] will refill the below med. Dr Loanne Drilling office told pt she need an appt for future refills or refer to Pcp .  [phone]  methimazole (TAPAZOLE) 5 MG tablet  Pharmacy [facility]

## 2017-05-30 ENCOUNTER — Ambulatory Visit: Payer: Medicare Other | Attending: General Surgery | Admitting: Physical Therapy

## 2017-05-30 ENCOUNTER — Telehealth: Payer: Self-pay | Admitting: Family Medicine

## 2017-05-30 ENCOUNTER — Encounter: Payer: Self-pay | Admitting: Physical Therapy

## 2017-05-30 ENCOUNTER — Other Ambulatory Visit: Payer: Self-pay

## 2017-05-30 DIAGNOSIS — I480 Paroxysmal atrial fibrillation: Secondary | ICD-10-CM

## 2017-05-30 DIAGNOSIS — R262 Difficulty in walking, not elsewhere classified: Secondary | ICD-10-CM | POA: Insufficient documentation

## 2017-05-30 DIAGNOSIS — M6281 Muscle weakness (generalized): Secondary | ICD-10-CM | POA: Insufficient documentation

## 2017-05-30 MED ORDER — WARFARIN SODIUM 3 MG PO TABS
ORAL_TABLET | ORAL | 2 refills | Status: DC
Start: 2017-05-30 — End: 2017-10-27

## 2017-05-30 NOTE — Therapy (Signed)
[facility] [address] [place], [place], [address] Phone: [phone]   Fax:  [phone]  Physical Therapy Treatment  Patient Details  Name: Danielle [name] MRN: [id number] Date of Birth: 05-May-1945 Referring Provider: [name], MD   Encounter Date: 05/30/2017  PT End of Session - 05/30/17 2201    Visit Number  4    Number of Visits  9    Date for PT Re-Evaluation  07/05/17    Authorization Type  UHC MCR    Authorization Time Period  05/06/17 to 07/05/17    PT Start Time  1537    PT Stop Time  1624    PT Time Calculation (min)  47 min    Equipment Utilized During Treatment  Gait belt    Activity Tolerance  Patient tolerated treatment well    Behavior During Therapy  [facility] for tasks assessed/performed       Past Medical History:  Diagnosis Date  . Allergy   . Anxiety   . Aortic stenosis    Status post bioprosthetic AVR  . Arthritis    DJD  . Asthma   . Breast cancer ([place]) 12/13/2016   Left breast  . Chronic systolic CHF (congestive heart failure) ([place])    EF 30% 04/2014  . COPD (chronic obstructive pulmonary disease) ([place])   . Coronary artery disease    per pt, had LHC prior to AVR in [place] that did not show any blockages; no stents/bypass  . Gastritis   . Hyperlipidemia   . Hypertension   . Hyperthyroidism   . Multiple thyroid nodules   . Osteoporosis   . Paroxysmal atrial fibrillation (HCC)   . Pelvis fracture ([place]) 08/19/2015   MULTIPLE   . Spontaneous pneumothorax 11/28/2015   left   . Stroke [facility]) 2000   rt hand weak    Past Surgical History:  Procedure Laterality Date  . BREAST LUMPECTOMY WITH RADIOACTIVE SEED AND SENTINEL LYMPH NODE BIOPSY Left 01/14/2017   Procedure: LEFT BREAST LUMPECTOMY WITH RADIOACTIVE SEED AND SENTINEL LYMPH NODE BIOPSY;  Surgeon: Rolm Bookbinder, MD;  Location: [place];  Service: General;  Laterality: Left;  . CHEST TUBE INSERTION   10/2015  . INTRAMEDULLARY (IM) NAIL INTERTROCHANTERIC Left 12/10/2015   Procedure: INTRAMEDULLARY (IM) NAIL INTERTROCHANTRIC;  Surgeon: Leandrew Koyanagi, MD;  Location: [place];  Service: Orthopedics;  Laterality: Left;  . PLEURADESIS Left 12/03/2015   Procedure: PLEURADESIS;  Surgeon: Melrose Nakayama, MD;  Location: [place];  Service: Thoracic;  Laterality: Left;  . RESECTION OF APICAL BLEB Left 12/03/2015   Procedure: BLEBECTOMY;  Surgeon: Melrose Nakayama, MD;  Location: [place];  Service: Thoracic;  Laterality: Left;  . THORACOSCOPY  12/03/2015  . VALVE REPLACEMENT    . VIDEO ASSISTED THORACOSCOPY Left 12/03/2015   Procedure: VIDEO ASSISTED THORACOSCOPY;  Surgeon: Melrose Nakayama, MD;  Location: [place];  Service: Thoracic;  Laterality: Left;    There were no vitals filed for this visit.  Subjective Assessment - 05/30/17 2157    Subjective  Doing 11 laps inside home now and it takes 15 minutes.     Patient is accompained by:  Family member husband arrived at end of session and discussed plan     Pertinent History  Arthritis, Left Breast Ca, CHF, COPD, HTN, osteoporosis, PAF, CVA (rt hand weak), 11/2015 fall with hip fracture     Limitations  Standing    How long can you stand comfortably?  "  not long" that's what will realy make my back and down my left leg hurt    How long can you walk comfortably?  < 5 minutes    Patient Stated Goals  walk with cane; ultimately walk with no cane    Currently in Pain?  No/denies      Treatment- Gait training-overground and on treadmill (4 minutes at 0.3 mph--pt becomes very anxious with attempts to incr velocity) with vc for increasing step length and bil heelstrike. Progressed from RW, to cane in one hand and HHA for other hand, to [facility] in one hand and close guarding with gait belt. 100 ft, 120 ft, 80 ft, 50 ft, 50 ft  Exercise- SLS with focus on maintaining level pelvis x 5 each leg (stood in front of mirror for her to clearly see); sidelying clam  with no band for improved hip stability in stance phase 10 reps bil.                          PT Education - 05/30/17 2159    Education Details  see addition to HEP; proper use of SPC    Person(s) Educated  Patient    Methods  Explanation;Demonstration;Handout    Comprehension  Verbalized understanding;Returned demonstration;Need further instruction       PT Short Term Goals - 05/06/17 1739      PT SHORT TERM GOAL #1   Title  Patient will be independent with her HEP addressing bil LE weakness and balance deficits. (Target for all STGs 06/05/17)    Time  4    Period  Weeks    Status  New    Target Date  06/05/17      PT SHORT TERM GOAL #2   Title  Patient will ambulate 115 ft with cane and minguard assist.     Time  4    Period  Weeks    Status  New      PT SHORT TERM GOAL #3   Title  Patient will improve 5x sit to stand to <=22 seconds to demonstrate improved LE strength and basic balance.     Baseline  26.7 sec    Time  4    Period  Weeks    Status  New      PT SHORT TERM GOAL #4   Title  Patient will improve gait velocity to 1.81 ft/sec or more with LRAD to demonstrate decreasing fall risk and progress towards norm for her age (61.79 ft/sec)    Baseline  1.48 ft/sec    Time  4    Period  Weeks    Status  New        PT Long Term Goals - 05/06/17 1744      PT LONG TERM GOAL #1   Title  Patient will verbalize a plan for transition to community-based exercise program upon discharge from PT. (Target for all LTGs 07/05/17)    Time  8    Period  Weeks    Status  New    Target Date  07/05/17      PT LONG TERM GOAL #2   Title  Patient will ambulate 115 ft with cane modified independent on level indoor terrain.    Time  8    Period  Weeks    Status  New      PT LONG TERM GOAL #3   Title  Pateint will improve 5x sit to stand  to <=20 seconds to demonstrate decreasing fall risk and improving LE strength.    Time  8    Period  Weeks    Status  New       PT LONG TERM GOAL #4   Title  Patient will improve gait velocity to >=2.0 ft/sec demonstrating improved safety for limited community ambuation and closer to norm for her age of 2.79 ft/sec    Time  8    Period  Weeks    Status  New        Long Term Clinic Goals - 02/14/17 1600      CC Long Term Goal  #1   Title  Patient will be independent in initial home exercise program    Time  4    Period  Weeks    Status  Achieved      CC Long Term Goal  #2   Title  Patient will report she is able to stand >/= 5 minutes to do short kitchen tasks.    Time  4    Period  Weeks    Status  Achieved      CC Long Term Goal  #3   Title  Patient will report she is able to walk to her mailbox and back without stopping due to fatigue to improve function.    Time  4    Period  Weeks    Status  Not Met      CC Long Term Goal  #4   Title  Patient will increase left shoulder active abduction to >/= 130 degrees in supine to tolerate radiation positioning.    Time  4    Period  Weeks    Status  Not Met      CC Long Term Goal  #5   Title  Patient will be able to perform sit to stand from a standard chair without use of UEs.    Time  4    Period  Weeks    Status  Not Met         Plan - 05/30/17 2202    Clinical Impression Statement  Session focused on gait training including use of SPC (trialed various tips to [facility] for incr support) and hip strengthening for carryover to improved gait and safety. Pateint continues to be fearful of falling and although she walks more upright with cane, she shortens her step-length and decreases velocity. She remains very motivated and appears to be very faithful in completing her HEP.     Rehab Potential  Good    Clinical Impairments Affecting Rehab Potential  Multiple comorbidities    PT Frequency  1x / week    PT Duration  8 weeks    PT Treatment/Interventions  ADLs/Self Care Home Management;Aquatic Therapy;DME Instruction;Gait training;Balance  training;Therapeutic exercise;Therapeutic activities;Functional mobility training;Stair training;Patient/family education;Passive range of motion;Manual techniques    PT Next Visit Plan  check STGs; continue to try to use a treadmill (pt goal); work on use of cane with quad or triangle tip; LE strengthening and balance; Static standing increases back pain, therefore avoid this position for ex's     PT Home Exercise Plan  Walking program, STS, cubii 2x/day 30 min each, side-steppin, heel/toe raises, bridges, sidelying clam    Consulted and Agree with Plan of Care  Patient;Family member/caregiver    Family Member Consulted  Husband, [place]       Patient will benefit from skilled therapeutic intervention in order to improve the  following deficits and impairments:  Abnormal gait, Decreased activity tolerance, Decreased balance, Decreased mobility, Decreased knowledge of use of DME, Decreased endurance, Decreased coordination, Decreased range of motion, Decreased strength, Impaired flexibility, Impaired UE functional use, Postural dysfunction, Obesity, Pain(Pain was not an issue on evaluation, however she reports recent back and LLE pain. PT will not directly address pain as part of the POC, however will monitor for pain during sessions. )  Visit Diagnosis: Muscle weakness (generalized)  Difficulty in walking, not elsewhere classified     Problem List Patient Active Problem List   Diagnosis Date Noted  . Recurrent genital herpes 04/13/2017  . Chronic pain disorder 01/25/2017  . Malignant neoplasm of upper-outer quadrant of left breast in female, estrogen receptor positive ([place]) 12/16/2016  . Chronic knee pain 12/07/2016  . Chronic bilateral low back pain without sciatica 12/07/2016  . Peripheral musculoskeletal gait disorder 12/07/2016  . Encounter for therapeutic drug monitoring 06/16/2016  . Generalized osteoarthritis of multiple sites 05/04/2016  . Chronic anticoagulation 05/04/2016  .  Stroke ([place]) 04/14/2016  . Aortic valve disease 04/10/2016  . S/P AVR 04/10/2016  . NICM (nonischemic cardiomyopathy) ([place]) 04/10/2016  . Upper airway cough syndrome 03/14/2016  . Morbid obesity due to excess calories ([place]) 03/14/2016  . COPD GOLD 0  03/11/2016  . Thrombocytopenia ([place]) 12/12/2015  . Anemia 12/12/2015  . Vitamin D deficiency 12/12/2015  . Acute urinary retention 12/11/2015  . Osteoporosis 12/11/2015  . Hip fracture ([place]) 12/10/2015  . Aortic atherosclerosis ([place]) 12/10/2015  . Lung blebs ([place]) 12/03/2015  . Pelvic fracture ([place]) 08/19/2015  . Fall at home 08/19/2015  . Essential hypertension 08/19/2015  . Hyperlipidemia 08/19/2015  . Asthma 08/19/2015  . Atrial fibrillation ([place]) 08/19/2015  . Hyperthyroidism 08/19/2015  . Chronic combined systolic and diastolic CHF (congestive heart failure) ([place])   . 3-vessel CAD   . Aortic stenosis     Rexanne Mano, PT 05/30/2017, 10:12 PM  [facility] [address] [place], [place], [address] Phone: [phone]   Fax:  [phone]  Name: Dianelys Scinto MRN: [id number] Date of Birth: 09-07-1945

## 2017-05-30 NOTE — Telephone Encounter (Signed)
Sent to Dr. [name] for review and approval.

## 2017-05-30 NOTE — Telephone Encounter (Signed)
She needs to call her endocrinologist office for this request. Thanks, BJ

## 2017-05-30 NOTE — Telephone Encounter (Signed)
Copied from [place] [phone]. Topic: Quick Communication - Rx Refill/Question >> May 30, 2017 11:23 AM Oliver Pila B wrote: Medication: warfarin (COUMADIN) 3 MG tablet [[id number]]  Has the patient contacted their pharmacy? Yes.   (Agent: If no, request that the patient contact the pharmacy for the refill.) Preferred Pharmacy (with phone number or street name): [facility] Agent: Please be advised that RX refills may take up to 3 business days. We ask that you follow-up with your pharmacy.

## 2017-05-30 NOTE — Patient Instructions (Signed)
Patient given sidelying clam exercise x 10 reps x each leg. (She had a picture with her from the [facility] so no additional picture for HEP provided.

## 2017-05-31 ENCOUNTER — Other Ambulatory Visit: Payer: Self-pay | Admitting: Physical Medicine & Rehabilitation

## 2017-05-31 ENCOUNTER — Other Ambulatory Visit: Payer: Self-pay | Admitting: Family Medicine

## 2017-05-31 NOTE — Telephone Encounter (Signed)
Spoke with patient and informed her that she had to call Dr. Loanne Drilling office for medication refill. Patient verbalized understanding.

## 2017-05-31 NOTE — Telephone Encounter (Signed)
Left message to give clinic a call back concerning Rx refill.

## 2017-06-07 ENCOUNTER — Encounter: Payer: Self-pay | Admitting: Physical Therapy

## 2017-06-07 ENCOUNTER — Ambulatory Visit: Payer: Medicare Other | Admitting: Physical Therapy

## 2017-06-07 DIAGNOSIS — R262 Difficulty in walking, not elsewhere classified: Secondary | ICD-10-CM | POA: Diagnosis not present

## 2017-06-07 DIAGNOSIS — M6281 Muscle weakness (generalized): Secondary | ICD-10-CM | POA: Diagnosis not present

## 2017-06-07 NOTE — Patient Instructions (Signed)
Abduction: Clam (Eccentric) - Side-Lying    Lie on your sofa on your side with your back against the back of the sofa. knees bent. Lift top knee, keeping feet together. Keep trunk steady. Slowly lower for 3-5 seconds. _10__ reps per set, __2_ sets per day, _5__ days per week. Add ___ lbs when you achieve ___ repetitions.  http://ecce.exer.us/65   Copyright  VHI. All rights reserved.

## 2017-06-08 ENCOUNTER — Ambulatory Visit (INDEPENDENT_AMBULATORY_CARE_PROVIDER_SITE_OTHER): Payer: Medicare Other | Admitting: General Practice

## 2017-06-08 DIAGNOSIS — Z7901 Long term (current) use of anticoagulants: Secondary | ICD-10-CM

## 2017-06-08 LAB — POCT INR: INR: 2.3

## 2017-06-08 NOTE — Therapy (Signed)
[facility] [address] [place], [place], [address] Phone: [phone]   Fax:  [phone]  Physical Therapy Treatment  Patient Details  Name: Danielle [name] MRN: [id number] Date of Birth: Jul 30, 1945 Referring Provider: [name], MD   Encounter Date: 06/07/2017  PT End of Session - 06/07/17 1700    Visit Number  5    Number of Visits  9    Date for PT Re-Evaluation  07/05/17    Authorization Type  [employer] MCR    Authorization Time Period  05/06/17 to 07/05/17    PT Start Time  1520    PT Stop Time  1605    PT Time Calculation (min)  45 min    Equipment Utilized During Treatment  Gait belt    Activity Tolerance  Patient tolerated treatment well    Behavior During Therapy  [facility] for tasks assessed/performed       Past Medical History:  Diagnosis Date  . Allergy   . Anxiety   . Aortic stenosis    Status post bioprosthetic AVR  . Arthritis    DJD  . Asthma   . Breast cancer ([place]) 12/13/2016   Left breast  . Chronic systolic CHF (congestive heart failure) ([place])    EF 30% 04/2014  . COPD (chronic obstructive pulmonary disease) ([place])   . Coronary artery disease    per pt, had LHC prior to AVR in [place] that did not show any blockages; no stents/bypass  . Gastritis   . Hyperlipidemia   . Hypertension   . Hyperthyroidism   . Multiple thyroid nodules   . Osteoporosis   . Paroxysmal atrial fibrillation (HCC)   . Pelvis fracture ([place]) 08/19/2015   MULTIPLE   . Spontaneous pneumothorax 11/28/2015   left   . Stroke [facility]) 2000   rt hand weak    Past Surgical History:  Procedure Laterality Date  . BREAST LUMPECTOMY WITH RADIOACTIVE SEED AND SENTINEL LYMPH NODE BIOPSY Left 01/14/2017   Procedure: LEFT BREAST LUMPECTOMY WITH RADIOACTIVE SEED AND SENTINEL LYMPH NODE BIOPSY;  Surgeon: Rolm Bookbinder, MD;  Location: [facility];  Service: General;  Laterality: Left;  . CHEST TUBE INSERTION   10/2015  . INTRAMEDULLARY (IM) NAIL INTERTROCHANTERIC Left 12/10/2015   Procedure: INTRAMEDULLARY (IM) NAIL INTERTROCHANTRIC;  Surgeon: Leandrew Koyanagi, MD;  Location: [facility];  Service: Orthopedics;  Laterality: Left;  . PLEURADESIS Left 12/03/2015   Procedure: PLEURADESIS;  Surgeon: Melrose Nakayama, MD;  Location: [facility];  Service: Thoracic;  Laterality: Left;  . RESECTION OF APICAL BLEB Left 12/03/2015   Procedure: BLEBECTOMY;  Surgeon: Melrose Nakayama, MD;  Location: [facility];  Service: Thoracic;  Laterality: Left;  . THORACOSCOPY  12/03/2015  . VALVE REPLACEMENT    . VIDEO ASSISTED THORACOSCOPY Left 12/03/2015   Procedure: VIDEO ASSISTED THORACOSCOPY;  Surgeon: Melrose Nakayama, MD;  Location: [facility];  Service: Thoracic;  Laterality: Left;    There were no vitals filed for this visit.  Subjective Assessment - 06/07/17 1524    Subjective  Was walking twice per day, 11 laps each time. However Sunday had increased SOB and was scared so has not resumed. Using inhalers.     Patient is accompained by:  Family member husband arrived at end of session and discussed plan     Pertinent History  Arthritis, Left Breast Ca, CHF, COPD, HTN, osteoporosis, PAF, CVA (rt hand weak), 11/2015 fall with hip fracture     Limitations  Standing    How long can you stand comfortably?  "not long" that's what will realy make my back and down my left leg hurt    How long can you walk comfortably?  < 5 minutes    Patient Stated Goals  walk with cane; ultimately walk with no cane    Currently in Pain?  No/denies                       [facility] Adult PT Treatment/Exercise - 06/07/17 1540      Transfers   Five time sit to stand comments   15.03 with UE support via armrests; norm for age 30.0 sec    Number of Reps  -- second set of 5 reps after timed set      Ambulation/Gait   Ambulation/Gait Assistance  4: Min guard;6: Modified independent (Device/Increase time)    Ambulation/Gait Assistance  Details  modified independent with RW; minguard with cane; much bettter sequencing with the cane with very slight increase in velocity compared to last week    Ambulation Distance (Feet)  80 Feet 54, 65, 30, 40, 80    Assistive device  Rolling walker;Straight cane;Parallel bars;1 person hand held assist    Gait Pattern  Step-through pattern;Decreased step length - right;Step-to pattern;Trunk flexed;Decreased dorsiflexion - right;Decreased dorsiflexion - left;Right foot flat;Left foot flat    Ambulation Surface  Indoor    Gait velocity  20/11.22=1.78 ft/sec RW 20/41.6=0.48 ft/sec SPC      Knee/Hip Exercises: Aerobic   Other Aerobic  Sci-Fit x 4 minutes L1.5; legs only          Balance Exercises - 06/07/17 1700      Balance Exercises: Standing   Tandem Stance  Eyes open;Upper extremity support 1    Tandem Gait  Forward;Retro;Upper extremity support    Sidestepping  Upper extremity support      OTAGO PROGRAM   Hip ABductor  10 reps          PT Short Term Goals - 06/08/17 1937      PT SHORT TERM GOAL #1   Title  Patient will be independent with her HEP addressing bil LE weakness and balance deficits. (Target for all STGs 06/05/17)    Time  4    Period  Weeks    Status  Achieved      PT SHORT TERM GOAL #2   Title  Patient will ambulate 115 ft with cane and minguard assist.     Baseline  06/07/17 Able to walk maximum of 65 ft with cane with minguard assist. Limited by dyspnea and back pain/fatigue    Time  4    Period  Weeks    Status  Partially Met      PT SHORT TERM GOAL #3   Title  Patient will improve 5x sit to stand to <=22 seconds to demonstrate improved LE strength and basic balance.     Baseline  26.7 sec; 06/07/17 15.03 sec     Time  4    Period  Weeks    Status  Achieved      PT SHORT TERM GOAL #4   Title  Patient will improve gait velocity to 1.81 ft/sec or more with LRAD to demonstrate decreasing fall risk and progress towards norm for her age (4.79 ft/sec)     Baseline  1.48 ft/sec; 06/07/17 with RW 1.78 ft/sec; with SPC 0.48 ft/sec    Time  4  Period  Weeks    Status  Partially Met        PT Long Term Goals - 06/08/17 1942      PT LONG TERM GOAL #1   Title  Patient will verbalize a plan for transition to community-based exercise program upon discharge from PT. (Target for all LTGs 07/05/17)    Time  8    Period  Weeks    Status  New      PT LONG TERM GOAL #2   Title  Patient will ambulate 115 ft with cane modified independent on level indoor terrain.    Time  8    Period  Weeks    Status  New      PT LONG TERM GOAL #3   Title  Pateint will improve 5x sit to stand to <=13.0 seconds to demonstrate decreasing fall risk and improving LE strength (norm for age=13.0 sec)    Baseline  Revised based on 4 week assessment= 15.08 sec    Time  8    Period  Weeks    Status  Revised      PT LONG TERM GOAL #4   Title  Patient will improve gait velocity to >=2.0 ft/sec with RW and >= 0.78 ft/sec with SPC demonstrating improved safety for limited community ambuation and closer to norm for her age of 2.79 ft/sec    Baseline  revised 06/07/17 based on velocity with cane 0.48 ft/sec, with RW 1.78 ft/sec    Time  8    Period  Weeks    Status  Revised            Plan - 06/08/17 1947    Clinical Impression Statement  STGs assessed with pt meeting 2 of 4 goals and partially meeting 2 of 4 goals (made progress but not to goal level). She clearly has been doing her HEP. She does only walk limited amounts with her cane as her husband now works 12 hour shifts and she does not want to practice if she is home alone (agreed). 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     Rehab Potential  Good    Clinical Impairments Affecting Rehab Potential  Multiple comorbidities    PT Frequency  1x / week    PT Duration  8 weeks    PT Treatment/Interventions   ADLs/Self Care Home Management;Aquatic Therapy;DME Instruction;Gait training;Balance training;Therapeutic exercise;Therapeutic activities;Functional mobility training;Stair training;Patient/family education;Passive range of motion;Manual techniques    PT Next Visit Plan  continue to try to use a treadmill (pt goal); work on use of cane with quad or triangle tip (including stairs with cane and 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); LE strengthening and balance; Static standing increases back pain, therefore avoid this position for ex's     PT Home Exercise Plan  Walking program, STS, cubii 2x/day 30 min each, side-steppin, heel/toe raises, bridges, sidelying clam    Consulted and Agree with Plan of Care  Patient       Patient will benefit from skilled therapeutic intervention in order to improve the following deficits and impairments:  Abnormal gait, Decreased activity tolerance, Decreased balance, Decreased mobility, Decreased knowledge of use of DME, Decreased endurance, Decreased coordination, Decreased range of motion, Decreased strength, Impaired flexibility, Impaired UE functional use, Postural dysfunction, Obesity, Pain(Pain was not an issue on evaluation, however she reports  recent back and LLE pain. PT will not directly address pain as part of the POC, however will monitor for pain during sessions. )  Visit Diagnosis: Muscle weakness (generalized)  Difficulty in walking, not elsewhere classified     Problem List Patient Active Problem List   Diagnosis Date Noted  . Recurrent genital herpes 04/13/2017  . Chronic pain disorder 01/25/2017  . Malignant neoplasm of upper-outer quadrant of left breast in female, estrogen receptor positive ([place]) 12/16/2016  . Chronic knee pain 12/07/2016  . Chronic bilateral low back pain without sciatica 12/07/2016  . Peripheral musculoskeletal gait disorder 12/07/2016  . Encounter for therapeutic drug monitoring 06/16/2016  . Generalized  osteoarthritis of multiple sites 05/04/2016  . Chronic anticoagulation 05/04/2016  . Stroke ([place]) 04/14/2016  . Aortic valve disease 04/10/2016  . S/P AVR 04/10/2016  . NICM (nonischemic cardiomyopathy) ([place]) 04/10/2016  . Upper airway cough syndrome 03/14/2016  . Morbid obesity due to excess calories ([place]) 03/14/2016  . COPD GOLD 0  03/11/2016  . Thrombocytopenia ([place]) 12/12/2015  . Anemia 12/12/2015  . Vitamin D deficiency 12/12/2015  . Acute urinary retention 12/11/2015  . Osteoporosis 12/11/2015  . Hip fracture ([place]) 12/10/2015  . Aortic atherosclerosis ([place]) 12/10/2015  . Lung blebs ([place]) 12/03/2015  . Pelvic fracture ([place]) 08/19/2015  . Fall at home 08/19/2015  . Essential hypertension 08/19/2015  . Hyperlipidemia 08/19/2015  . Asthma 08/19/2015  . Atrial fibrillation ([place]) 08/19/2015  . Hyperthyroidism 08/19/2015  . Chronic combined systolic and diastolic CHF (congestive heart failure) ([place])   . 3-vessel CAD   . Aortic stenosis     Rexanne Mano, PT 06/08/2017, 7:59 PM  [facility] [address] [place], [place], [address] Phone: [phone]   Fax:  [phone]  Name: Danielle [name] MRN: [id number] Date of Birth: [birth year]

## 2017-06-08 NOTE — Patient Instructions (Addendum)
Pre visit review using our clinic review tool, if applicable. No additional management support is needed unless otherwise documented below in the visit note.  Continue to take 1 tablet daily except 1 1/2 tablets on Wednesdays.  Re-check in 4 weeks. (Use only 3 mg tablets only).

## 2017-06-13 ENCOUNTER — Ambulatory Visit: Payer: Medicare Other | Admitting: Physical Therapy

## 2017-06-14 ENCOUNTER — Encounter: Payer: Self-pay | Admitting: Endocrinology

## 2017-06-14 ENCOUNTER — Ambulatory Visit: Payer: Medicare Other | Admitting: Endocrinology

## 2017-06-14 VITALS — BP 128/88 | HR 84 | Resp 18 | Wt 238.4 lb

## 2017-06-14 DIAGNOSIS — E0590 Thyrotoxicosis, unspecified without thyrotoxic crisis or storm: Secondary | ICD-10-CM

## 2017-06-14 LAB — T4, FREE: Free T4: 0.8 ng/dL (ref 0.60–1.60)

## 2017-06-14 LAB — TSH: TSH: 0.89 u[IU]/mL (ref 0.35–4.50)

## 2017-06-14 NOTE — Patient Instructions (Addendum)
blood tests are requested for you today.  We'll let you know about the results. If ever you have fever while taking methimazole, stop it and call us, even if the reason is obvious, because of the risk of a rare side-effect. Please come back for a follow-up appointment in 3-4 months.   

## 2017-06-14 NOTE — [place] Notes (Signed)
Subjective:    Patient ID: Danielle [name], female    DOB: 1945/06/22, 72 y.o.   MRN: [id number]  HPI Pt returns for f/u of hyperthyroidism (dx'ed 2005; she has been on tapazole since 2015; she had thyroid US, but it was in 2005, in [place], and result is not available; she says hip pain is now too much for her to be isolated for RAI (husband helps her at home)).  She takes 3x5 mg qd.  pt states she feels well in general.  Past Medical History:  Diagnosis Date  . Allergy   . Anxiety   . Aortic stenosis    Status post bioprosthetic AVR  . Arthritis    DJD  . Asthma   . Breast cancer ([place]) 12/13/2016   Left breast  . Chronic systolic CHF (congestive heart failure) ([place])    EF 30% 04/2014  . COPD (chronic obstructive pulmonary disease) ([place])   . Coronary artery disease    per pt, had LHC prior to AVR in [place] that did not show any blockages; no stents/bypass  . Gastritis   . Hyperlipidemia   . Hypertension   . Hyperthyroidism   . Multiple thyroid nodules   . Osteoporosis   . Paroxysmal atrial fibrillation (HCC)   . Pelvis fracture ([place]) 08/19/2015   MULTIPLE   . Spontaneous pneumothorax 11/28/2015   left   . Stroke [facility]) 2000   rt hand weak    Past Surgical History:  Procedure Laterality Date  . BREAST LUMPECTOMY WITH RADIOACTIVE SEED AND SENTINEL LYMPH NODE BIOPSY Left 01/14/2017   Procedure: LEFT BREAST LUMPECTOMY WITH RADIOACTIVE SEED AND SENTINEL LYMPH NODE BIOPSY;  Surgeon: Rolm Bookbinder, MD;  Location: [facility];  Service: General;  Laterality: Left;  . CHEST TUBE INSERTION  10/2015  . INTRAMEDULLARY (IM) NAIL INTERTROCHANTERIC Left 12/10/2015   Procedure: INTRAMEDULLARY (IM) NAIL INTERTROCHANTRIC;  Surgeon: Leandrew Koyanagi, MD;  Location: [facility];  Service: Orthopedics;  Laterality: Left;  . PLEURADESIS Left 12/03/2015   Procedure: PLEURADESIS;  Surgeon: Melrose Nakayama, MD;  Location: [facility];  Service: Thoracic;  Laterality: Left;  . RESECTION OF  APICAL BLEB Left 12/03/2015   Procedure: BLEBECTOMY;  Surgeon: Melrose Nakayama, MD;  Location: [facility];  Service: Thoracic;  Laterality: Left;  . THORACOSCOPY  12/03/2015  . VALVE REPLACEMENT    . VIDEO ASSISTED THORACOSCOPY Left 12/03/2015   Procedure: VIDEO ASSISTED THORACOSCOPY;  Surgeon: Melrose Nakayama, MD;  Location: [facility];  Service: Thoracic;  Laterality: Left;    Social History   Socioeconomic History  . Marital status: Married    Spouse name: Sherwood  . Number of children: 0  . Years of education: Not on file  . Highest education level: Not on file  Occupational History  . Occupation: Retired in 2004  Social Needs  . Financial resource strain: Not on file  . Food insecurity:    Worry: Not on file    Inability: Not on file  . Transportation needs:    Medical: Not on file    Non-medical: Not on file  Tobacco Use  . Smoking status: Former Smoker    Packs/day: 0.25    Years: 10.00    Pack years: 2.50    Types: Cigarettes    Last attempt to quit: 03/01/2010    Years since quitting: 7.3  . Smokeless tobacco: Never Used  Substance and Sexual Activity  . Alcohol use: No  . Drug use: No  . Sexual activity:  Not on file  Lifestyle  . Physical activity:    Days per week: Not on file    Minutes per session: Not on file  . Stress: Not on file  Relationships  . Social connections:    Talks on phone: Not on file    Gets together: Not on file    Attends religious service: Not on file    Active member of club or organization: Not on file    Attends meetings of clubs or organizations: Not on file    Relationship status: Not on file  . Intimate partner violence:    Fear of current or ex partner: Not on file    Emotionally abused: Not on file    Physically abused: Not on file    Forced sexual activity: Not on file  Other Topics Concern  . Not on file  Social History Narrative   Lives with husband.  Ambulated independently.    Current Outpatient Medications on  File Prior to Visit  Medication Sig Dispense Refill  . acetaminophen (TYLENOL) 500 MG tablet Take 1,000 mg by mouth daily as needed (PAIN).    Marland Kitchen alendronate (FOSAMAX) 70 MG tablet Take 1 tablet (70 mg total) by mouth every Saturday. with a full glass of water on an empty stomach. 13 tablet 3  . ALPRAZolam (XANAX) 0.25 MG tablet Take 1 tablet (0.25 mg total) by mouth at bedtime as needed for anxiety or sleep. 30 tablet 0  . aspirin EC 81 MG tablet Take 81 mg daily by mouth.    Marland Kitchen atorvastatin (LIPITOR) 80 MG tablet Take 1 tablet (80 mg total) by mouth daily. 90 tablet 3  . budesonide-formoterol (SYMBICORT) 160-4.5 MCG/ACT inhaler Inhale 2 puffs into the lungs 2 (two) times daily. 1 Inhaler 0  . carvedilol (COREG) 25 MG tablet Take 1 tablet (25 mg total) by mouth 2 (two) times daily. 180 tablet 2  . Cholecalciferol (VITAMIN D3) 400 units tablet Take 1 tablet (400 Units total) by mouth daily. 30 tablet 0  . diclofenac sodium (VOLTAREN) 1 % GEL Apply 4 g topically 4 (four) times daily. 500 g 3  . diphenhydramine-acetaminophen (TYLENOL PM) 25-500 MG TABS tablet Take 2 tablets by mouth at bedtime as needed (SLEEP).     . furosemide (LASIX) 20 MG tablet Take 1 tablet (20 mg total) by mouth daily. 90 tablet 2  . gabapentin (NEURONTIN) 100 MG capsule TAKE 1 CAPSULE BY MOUTH THREE TIMES DAILY 90 capsule 2  . losartan (COZAAR) 50 MG tablet Take 1 tablet by mouth daily 90 tablet 3  . ranitidine (ZANTAC) 150 MG tablet Take 1 tablet (150 mg total) by mouth 2 (two) times daily. 180 tablet 3  . senna-docusate (SENOKOT-S) 8.6-50 MG tablet Take 2 tablets at bedtime by mouth.     . tamoxifen (NOLVADEX) 20 MG tablet Take 1 tablet (20 mg total) by mouth daily. 30 tablet 2  . traMADol (ULTRAM) 50 MG tablet Take 1 tablet (50 mg total) by mouth 3 (three) times daily. 90 tablet 5  . valACYclovir (VALTREX) 500 MG tablet Take 1 tablet (500 mg total) by mouth 2 (two) times daily. For 3 days when outbreaks. 24 tablet 0  .  warfarin (COUMADIN) 3 MG tablet TAKE AS DIRECTED BY  ANTICOAGULATION  CLINIC 40 tablet 2  . warfarin (COUMADIN) 3 MG tablet Take as directed by anticoagulation clinic. 40 tablet 2   No current facility-administered medications on file prior to visit.     Allergies  Allergen Reactions  . Lisinopril Cough  . Tetanus Toxoid Adsorbed Swelling    Arm swelling    Family History  Problem Relation Age of Onset  . Diabetes Mother   . Heart attack Mother 32  . Diabetes Father   . Lung cancer Father   . Diabetes Sister   . Thyroid disease Sister   . Diabetes Sister   . HIV Brother     BP 128/88   Pulse 84   Resp 18   Wt 238 lb 6.4 oz (108.1 kg)   SpO2 95%   BMI 37.34 kg/m    Review of Systems Denies fever.    Objective:   Physical Exam VITAL SIGNS:  See vs page GENERAL: no distress NECK: thyroid is approx 5 times normal size, R>L, but no palpable nodule.     Lab Results  Component Value Date   TSH 0.89 06/14/2017      Assessment & Plan:  Hyperthyroidism: well-controlled. OA: she can't be isolated for RAI.  Patient Instructions  blood tests are requested for you today.  We'll let you know about the results.  If ever you have fever while taking methimazole, stop it and call us, even if the reason is obvious, because of the risk of a rare side-effect.   Please come back for a follow-up appointment in 3-4 months.

## 2017-06-15 ENCOUNTER — Ambulatory Visit: Payer: Medicare Other | Admitting: Endocrinology

## 2017-06-16 ENCOUNTER — Telehealth: Payer: Self-pay | Admitting: Endocrinology

## 2017-06-16 ENCOUNTER — Ambulatory Visit: Payer: Medicare Other | Admitting: Physical Medicine & Rehabilitation

## 2017-06-16 ENCOUNTER — Other Ambulatory Visit: Payer: Self-pay

## 2017-06-16 MED ORDER — METHIMAZOLE 5 MG PO TABS: ORAL_TABLET | ORAL | 1 refills | Status: DC

## 2017-06-16 NOTE — Telephone Encounter (Signed)
Patient stated the pharmacy haven't received medication for  methimazole (TAPAZOLE) 5 MG tablet [[id number]]  [place], [place] - [address] [phone] (Phone) [phone] (Fax)

## 2017-06-16 NOTE — Telephone Encounter (Signed)
I have sent to patient;'s pharmacy.  

## 2017-06-20 ENCOUNTER — Telehealth: Payer: Self-pay | Admitting: Endocrinology

## 2017-06-20 ENCOUNTER — Other Ambulatory Visit: Payer: Self-pay

## 2017-06-20 NOTE — Telephone Encounter (Signed)
Original Order:  methimazole (TAPAZOLE) 5 MG tablet [[id number]]  46 day supply    Pharmacy:  [place], [place] - [address] DEA #:  --

## 2017-06-21 ENCOUNTER — Telehealth: Payer: Self-pay

## 2017-06-21 ENCOUNTER — Ambulatory Visit (INDEPENDENT_AMBULATORY_CARE_PROVIDER_SITE_OTHER): Payer: Medicare Other

## 2017-06-21 VITALS — BP 126/80 | HR 79 | Ht 67.0 in | Wt 238.0 lb

## 2017-06-21 DIAGNOSIS — Z0000 Encounter for general adult medical examination without abnormal findings: Secondary | ICD-10-CM | POA: Diagnosis not present

## 2017-06-21 DIAGNOSIS — Z1159 Encounter for screening for other viral diseases: Secondary | ICD-10-CM

## 2017-06-21 NOTE — Patient Instructions (Addendum)
[name] , Thank you for taking time to come for your Medicare Wellness Visit. I appreciate your ongoing commitment to your health goals. Please review the following plan we discussed and let me know if I can assist you in the future.   Shingrix is a vaccine for the prevention of Shingles in Adults 50 and older.  If you are on Medicare, the shingrix is covered under your Part D plan, so you will take both of the vaccines in the series at your pharmacy. Please check with your benefits regarding applicable copays or out of pocket expenses.  The Shingrix is given in 2 vaccines approx 8 weeks apart. You must receive the 2nd dose prior to 6 months from receipt of the first. Please have the pharmacist print out you Immunization  dates for our office records    These are the goals we discussed: Goals    . Patient Stated     To walk without assistance and use a cane       This is a list of the screening recommended for you and due dates:  Health Maintenance  Topic Date Due  .  Hepatitis C: One time screening is recommended by Center for Disease Control  (CDC) for  adults born from [birth year] through 1965.   Apr 15, 1945  . Tetanus Vaccine  01/13/1965  . Colon Cancer Screening  01/14/1996  . DEXA scan (bone density measurement)  01/14/2011  . Flu Shot  09/29/2017  . Mammogram  12/07/2018  . Pneumonia vaccines  Completed      Fat and Cholesterol Restricted Diet Getting too much fat and cholesterol in your diet may cause health problems. Following this diet helps keep your fat and cholesterol at normal levels. This can keep you from getting sick. What types of fat should I choose?  Choose monosaturated and polyunsaturated fats. These are found in foods such as olive oil, canola oil, flaxseeds, walnuts, almonds, and seeds.  Eat more omega-3 fats. Good choices include salmon, mackerel, sardines, tuna, flaxseed oil, and ground flaxseeds.  Limit saturated fats. These are in animal  products such as meats, butter, and cream. They can also be in plant products such as palm oil, palm kernel oil, and coconut oil.  Avoid foods with partially hydrogenated oils in them. These contain trans fats. Examples of foods that have trans fats are stick margarine, some tub margarines, cookies, crackers, and other baked goods. What general guidelines do I need to follow?  Check food labels. Look for the words "trans fat" and "saturated fat."  When preparing a meal: ? Fill half of your plate with vegetables and green salads. ? Fill one fourth of your plate with whole grains. Look for the word "whole" as the first word in the ingredient list. ? Fill one fourth of your plate with lean protein foods.  Eat more foods that have fiber, like apples, carrots, beans, peas, and barley.  Eat more home-cooked foods. Eat less at restaurants and buffets.  Limit or avoid alcohol.  Limit foods high in starch and sugar.  Limit fried foods.  Cook foods without frying them. Baking, boiling, grilling, and broiling are all great options.  Lose weight if you are overweight. Losing even a small amount of weight can help your overall health. It can also help prevent diseases such as diabetes and heart disease. What foods can I eat? Grains Whole grains, such as whole wheat or whole grain breads, crackers, cereals, and pasta. Unsweetened oatmeal, bulgur, barley, quinoa,  or brown rice. Corn or whole wheat flour tortillas. Vegetables Fresh or frozen vegetables (raw, steamed, roasted, or grilled). Green salads. Fruits All fresh, canned (in natural juice), or frozen fruits. Meat and Other Protein Products Ground beef (85% or leaner), grass-fed beef, or beef trimmed of fat. Skinless chicken or [place]. Ground chicken or [place]. Pork trimmed of fat. All fish and seafood. Eggs. Dried beans, peas, or lentils. Unsalted nuts or seeds. Unsalted canned or dry beans. Dairy Low-fat dairy products, such as skim or 1%  milk, 2% or reduced-fat cheeses, low-fat ricotta or cottage cheese, or plain low-fat yogurt. Fats and Oils Tub margarines without trans fats. Light or reduced-fat mayonnaise and salad dressings. Avocado. Olive, canola, sesame, or safflower oils. Natural peanut or almond butter (choose ones without added sugar and oil). The items listed above may not be a complete list of recommended foods or beverages. Contact your dietitian for more options. What foods are not recommended? Grains White bread. White pasta. White rice. Cornbread. Bagels, pastries, and croissants. Crackers that contain trans fat. Vegetables White potatoes. Corn. Creamed or fried vegetables. Vegetables in a cheese sauce. Fruits Dried fruits. Canned fruit in light or heavy syrup. Fruit juice. Meat and Other Protein Products Fatty cuts of meat. Ribs, chicken wings, bacon, sausage, bologna, salami, chitterlings, fatback, hot dogs, bratwurst, and packaged luncheon meats. Liver and organ meats. Dairy Whole or 2% milk, cream, half-and-half, and cream cheese. Whole milk cheeses. Whole-fat or sweetened yogurt. Full-fat cheeses. Nondairy creamers and whipped toppings. Processed cheese, cheese spreads, or cheese curds. Sweets and Desserts Corn syrup, sugars, honey, and molasses. Candy. Jam and jelly. Syrup. Sweetened cereals. Cookies, pies, cakes, donuts, muffins, and ice cream. Fats and Oils Butter, stick margarine, lard, shortening, ghee, or bacon fat. Coconut, palm kernel, or palm oils. Beverages Alcohol. Sweetened drinks (such as sodas, lemonade, and fruit drinks or punches). The items listed above may not be a complete list of foods and beverages to avoid. Contact your dietitian for more information. This information is not intended to replace advice given to you by your health care provider. Make sure you discuss any questions you have with your health care provider. Document Released: 08/17/2011 Document Revised: 10/23/2015 Document  Reviewed: 05/17/2013 Elsevier Interactive Patient Education  2018 [place] in the Home Falls can cause injuries. They can happen to people of all ages. There are many things you can do to make your home safe and to help prevent falls. What can I do on the outside of my home?  Regularly fix the edges of walkways and driveways and fix any cracks.  Remove anything that might make you trip as you walk through a door, such as a raised step or threshold.  Trim any bushes or trees on the path to your home.  Use bright outdoor lighting.  Clear any walking paths of anything that might make someone trip, such as rocks or tools.  Regularly check to see if handrails are loose or broken. Make sure that both sides of any steps have handrails.  Any raised decks and porches should have guardrails on the edges.  Have any leaves, snow, or ice cleared regularly.  Use sand or salt on walking paths during winter.  Clean up any spills in your garage right away. This includes oil or grease spills. What can I do in the bathroom?  Use night lights.  Install grab bars by the toilet and in the tub and shower. Do not use towel bars  as grab bars.  Use non-skid mats or decals in the tub or shower.  If you need to sit down in the shower, use a plastic, non-slip stool.  Keep the floor dry. Clean up any water that spills on the floor as soon as it happens.  Remove soap buildup in the tub or shower regularly.  Attach bath mats securely with double-sided non-slip rug tape.  Do not have throw rugs and other things on the floor that can make you trip. What can I do in the bedroom?  Use night lights.  Make sure that you have a light by your bed that is easy to reach.  Do not use any sheets or blankets that are too big for your bed. They should not hang down onto the floor.  Have a firm chair that has side arms. You can use this for support while you get dressed.  Do not have  throw rugs and other things on the floor that can make you trip. What can I do in the kitchen?  Clean up any spills right away.  Avoid walking on wet floors.  Keep items that you use a lot in easy-to-reach places.  If you need to reach something above you, use a strong step stool that has a grab bar.  Keep electrical cords out of the way.  Do not use floor polish or wax that makes floors slippery. If you must use wax, use non-skid floor wax.  Do not have throw rugs and other things on the floor that can make you trip. What can I do with my stairs?  Do not leave any items on the stairs.  Make sure that there are handrails on both sides of the stairs and use them. Fix handrails that are broken or loose. Make sure that handrails are as long as the stairways.  Check any carpeting to make sure that it is firmly attached to the stairs. Fix any carpet that is loose or worn.  Avoid having throw rugs at the top or bottom of the stairs. If you do have throw rugs, attach them to the floor with carpet tape.  Make sure that you have a light switch at the top of the stairs and the bottom of the stairs. If you do not have them, ask someone to add them for you. What else can I do to help prevent falls?  Wear shoes that: ? Do not have high heels. ? Have rubber bottoms. ? Are comfortable and fit you well. ? Are closed at the toe. Do not wear sandals.  If you use a stepladder: ? Make sure that it is fully opened. Do not climb a closed stepladder. ? Make sure that both sides of the stepladder are locked into place. ? Ask someone to hold it for you, if possible.  Clearly mark and make sure that you can see: ? Any grab bars or handrails. ? First and last steps. ? Where the edge of each step is.  Use tools that help you move around (mobility aids) if they are needed. These include: ? Canes. ? Walkers. ? Scooters. ? Crutches.  Turn on the lights when you go into a dark area. Replace any  light bulbs as soon as they burn out.  Set up your furniture so you have a clear path. Avoid moving your furniture around.  If any of your floors are uneven, fix them.  If there are any pets around you, be aware of where they are.  Review your medicines with your doctor. Some medicines can make you feel dizzy. This can increase your chance of falling. Ask your doctor what other things that you can do to help prevent falls. This information is not intended to replace advice given to you by your health care provider. Make sure you discuss any questions you have with your health care provider. Document Released: 12/12/2008 Document Revised: 07/24/2015 Document Reviewed: 03/22/2014 Elsevier Interactive Patient Education  2018 [place] Maintenance, Female Adopting a healthy lifestyle and getting preventive care can go a long way to promote health and wellness. Talk with your health care provider about what schedule of regular examinations is right for you. This is a good chance for you to check in with your provider about disease prevention and staying healthy. In between checkups, there are plenty of things you can do on your own. Experts have done a lot of research about which lifestyle changes and preventive measures are most likely to keep you healthy. Ask your health care provider for more information. Weight and diet Eat a healthy diet  Be sure to include plenty of vegetables, fruits, low-fat dairy products, and lean protein.  Do not eat a lot of foods high in solid fats, added sugars, or salt.  Get regular exercise. This is one of the most important things you can do for your health. ? Most adults should exercise for at least 150 minutes each week. The exercise should increase your heart rate and make you sweat (moderate-intensity exercise). ? Most adults should also do strengthening exercises at least twice a week. This is in addition to the moderate-intensity  exercise.  Maintain a healthy weight  Body mass index (BMI) is a measurement that can be used to identify possible weight problems. It estimates body fat based on height and weight. Your health care provider can help determine your BMI and help you achieve or maintain a healthy weight.  For females 72 years of age and older: ? A BMI below 18.5 is considered underweight. ? A BMI of 18.5 to 24.9 is normal. ? A BMI of 25 to 29.9 is considered overweight. ? A BMI of 30 and above is considered obese.  Watch levels of cholesterol and blood lipids  You should start having your blood tested for lipids and cholesterol at 72 years of age, then have this test every 5 years.  You may need to have your cholesterol levels checked more often if: ? Your lipid or cholesterol levels are high. ? You are older than 72 years of age. ? You are at high risk for heart disease.  Cancer screening Lung Cancer  Lung cancer screening is recommended for adults 10-12 years old who are at high risk for lung cancer because of a history of smoking.  A yearly low-dose CT scan of the lungs is recommended for people who: ? Currently smoke. ? Have quit within the past 15 years. ? Have at least a 30-pack-year history of smoking. A pack year is smoking an average of one pack of cigarettes a day for 1 year.  Yearly screening should continue until it has been 15 years since you quit.  Yearly screening should stop if you develop a health problem that would prevent you from having lung cancer treatment.  Breast Cancer  Practice breast self-awareness. This means understanding how your breasts normally appear and feel.  It also means doing regular breast self-exams. Let your health care provider know about any changes, no matter  how small.  If you are in your 20s or 30s, you should have a clinical breast exam (CBE) by a health care provider every 1-3 years as part of a regular health exam.  If you are 41 or older, have  a CBE every year. Also consider having a breast X-ray (mammogram) every year.  If you have a family history of breast cancer, talk to your health care provider about genetic screening.  If you are at high risk for breast cancer, talk to your health care provider about having an MRI and a mammogram every year.  Breast cancer gene (BRCA) assessment is recommended for women who have family members with BRCA-related cancers. BRCA-related cancers include: ? Breast. ? Ovarian. ? Tubal. ? Peritoneal cancers.  Results of the assessment will determine the need for genetic counseling and BRCA1 and BRCA2 testing.  Cervical Cancer Your health care provider may recommend that you be screened regularly for cancer of the pelvic organs (ovaries, uterus, and vagina). This screening involves a pelvic examination, including checking for microscopic changes to the surface of your cervix (Pap test). You may be encouraged to have this screening done every 3 years, beginning at age 19.  For women ages 61-65, health care providers may recommend pelvic exams and Pap testing every 3 years, or they may recommend the Pap and pelvic exam, combined with testing for human papilloma virus (HPV), every 5 years. Some types of HPV increase your risk of cervical cancer. Testing for HPV may also be done on women of any age with unclear Pap test results.  Other health care providers may not recommend any screening for nonpregnant women who are considered low risk for pelvic cancer and who do not have symptoms. Ask your health care provider if a screening pelvic exam is right for you.  If you have had past treatment for cervical cancer or a condition that could lead to cancer, you need Pap tests and screening for cancer for at least 20 years after your treatment. If Pap tests have been discontinued, your risk factors (such as having a new sexual partner) need to be reassessed to determine if screening should resume. Some women have  medical problems that increase the chance of getting cervical cancer. In these cases, your health care provider may recommend more frequent screening and Pap tests.  Colorectal Cancer  This type of cancer can be detected and often prevented.  Routine colorectal cancer screening usually begins at 72 years of age and continues through 72 years of age.  Your health care provider may recommend screening at an earlier age if you have risk factors for colon cancer.  Your health care provider may also recommend using home test kits to check for hidden blood in the stool.  A small camera at the end of a tube can be used to examine your colon directly (sigmoidoscopy or colonoscopy). This is done to check for the earliest forms of colorectal cancer.  Routine screening usually begins at age 4.  Direct examination of the colon should be repeated every 5-10 years through 72 years of age. However, you may need to be screened more often if early forms of precancerous polyps or small growths are found.  Skin Cancer  Check your skin from head to toe regularly.  Tell your health care provider about any new moles or changes in moles, especially if there is a change in a mole's shape or color.  Also tell your health care provider if you have a  mole that is larger than the size of a pencil eraser.  Always use sunscreen. Apply sunscreen liberally and repeatedly throughout the day.  Protect yourself by wearing long sleeves, pants, a wide-brimmed hat, and sunglasses whenever you are outside.  Heart disease, diabetes, and high blood pressure  High blood pressure causes heart disease and increases the risk of stroke. High blood pressure is more likely to develop in: ? People who have blood pressure in the high end of the normal range (130-139/85-89 mm Hg). ? People who are overweight or obese. ? People who are African American.  If you are 68-48 years of age, have your blood pressure checked every 3-5  years. If you are 44 years of age or older, have your blood pressure checked every year. You should have your blood pressure measured twice-once when you are at a hospital or clinic, and once when you are not at a hospital or clinic. Record the average of the two measurements. To check your blood pressure when you are not at a hospital or clinic, you can use: ? An automated blood pressure machine at a pharmacy. ? A home blood pressure monitor.  If you are between 63 years and 22 years old, ask your health care provider if you should take aspirin to prevent strokes.  Have regular diabetes screenings. This involves taking a blood sample to check your fasting blood sugar level. ? If you are at a normal weight and have a low risk for diabetes, have this test once every three years after 72 years of age. ? If you are overweight and have a high risk for diabetes, consider being tested at a younger age or more often. Preventing infection Hepatitis B  If you have a higher risk for hepatitis B, you should be screened for this virus. You are considered at high risk for hepatitis B if: ? You were born in a country where hepatitis B is common. Ask your health care provider which countries are considered high risk. ? Your parents were born in a high-risk country, and you have not been immunized against hepatitis B (hepatitis B vaccine). ? You have HIV or AIDS. ? You use needles to inject street drugs. ? You live with someone who has hepatitis B. ? You have had sex with someone who has hepatitis B. ? You get hemodialysis treatment. ? You take certain medicines for conditions, including cancer, organ transplantation, and autoimmune conditions.  Hepatitis C  Blood testing is recommended for: ? Everyone born from [birth year] through 1965. ? Anyone with known risk factors for hepatitis C.  Sexually transmitted infections (STIs)  You should be screened for sexually transmitted infections (STIs) including  gonorrhea and chlamydia if: ? You are sexually active and are younger than 72 years of age. ? You are older than 72 years of age and your health care provider tells you that you are at risk for this type of infection. ? Your sexual activity has changed since you were last screened and you are at an increased risk for chlamydia or gonorrhea. Ask your health care provider if you are at risk.  If you do not have HIV, but are at risk, it may be recommended that you take a prescription medicine daily to prevent HIV infection. This is called pre-exposure prophylaxis (PrEP). You are considered at risk if: ? You are sexually active and do not regularly use condoms or know the HIV status of your partner(s). ? You take drugs by injection. ? You  are sexually active with a partner who has HIV.  Talk with your health care provider about whether you are at high risk of being infected with HIV. If you choose to begin PrEP, you should first be tested for HIV. You should then be tested every 3 months for as long as you are taking PrEP. Pregnancy  If you are premenopausal and you may become pregnant, ask your health care provider about preconception counseling.  If you may become pregnant, take 400 to 800 micrograms (mcg) of folic acid every day.  If you want to prevent pregnancy, talk to your health care provider about birth control (contraception). Osteoporosis and menopause  Osteoporosis is a disease in which the bones lose minerals and strength with aging. This can result in serious bone fractures. Your risk for osteoporosis can be identified using a bone density scan.  If you are 59 years of age or older, or if you are at risk for osteoporosis and fractures, ask your health care provider if you should be screened.  Ask your health care provider whether you should take a calcium or vitamin D supplement to lower your risk for osteoporosis.  Menopause may have certain physical symptoms and  risks.  Hormone replacement therapy may reduce some of these symptoms and risks. Talk to your health care provider about whether hormone replacement therapy is right for you. Follow these instructions at home:  Schedule regular health, dental, and eye exams.  Stay current with your immunizations.  Do not use any tobacco products including cigarettes, chewing tobacco, or electronic cigarettes.  If you are pregnant, do not drink alcohol.  If you are breastfeeding, limit how much and how often you drink alcohol.  Limit alcohol intake to no more than 1 drink per day for nonpregnant women. One drink equals 12 ounces of beer, 5 ounces of wine, or 1 ounces of hard liquor.  Do not use street drugs.  Do not share needles.  Ask your health care provider for help if you need support or information about quitting drugs.  Tell your health care provider if you often feel depressed.  Tell your health care provider if you have ever been abused or do not feel safe at home. This information is not intended to replace advice given to you by your health care provider. Make sure you discuss any questions you have with your health care provider. Document Released: 08/31/2010 Document Revised: 07/24/2015 Document Reviewed: 11/19/2014 Elsevier Interactive Patient Education  [employer].

## 2017-06-21 NOTE — Telephone Encounter (Signed)
I will prefer for her to continue following with Dr Loanne Drilling for hyperthyroidism.  Danielle [name]

## 2017-06-21 NOTE — Progress Notes (Signed)
Subjective:   Danielle [name] is a 72 y.o. female who presents for Medicare Annual (Subsequent) preventive examination.  Reports health as improving   Last OV 04/2017 Stroke 2000 fx pelvis 07/2015 Breast cancer 11/2016- done with this and is on PT  Started x 6 ago and has 3 more week  S/p AVR   Diet  BMI 37  Eat fruits, vegetables and fiber   Exercise PT now x 3 more weeks smoking hx; quit 2012; 2.5 pack year   Cardiac Risk Factors include: advanced age (>53men, >73 women);dyslipidemia;hypertension;obesity (BMI >30kg/m2);sedentary lifestyle;family history of premature cardiovascular disease     Health Maintenance Due  Topic Date Due  . Hepatitis C Screening  01-05-1946  . COLONOSCOPY  01/14/1996   Mammogram 11/2016 Dx osteoporosis; may repeat dexa after she is on Fosamax several years   Had colonoscopy at [facility] prior to leaving [place] 2016; so this was completed 2015  Dexa scan 2012 per a note; do not see the result  Recent hx of fx pelvis      Objective:     Vitals: BP 126/80   Pulse 79   Ht 5\' 7"  (1.702 m)   Wt 238 lb (108 kg)   SpO2 98%   BMI 37.28 kg/m   Body mass index is 37.28 kg/m.  Advanced Directives 06/21/2017 05/06/2017 05/03/2017 04/11/2017 03/15/2017 02/23/2017 02/23/2017  Does Patient Have a Medical Advance Directive? Yes Yes No No Yes Yes Yes  Type of Advance Directive - - - - Press photographer;Living will Short;Living will [place];Living will  Does patient want to make changes to medical advance directive? - No - Patient declined - No - Patient declined - - -  Copy of [place] in Chart? - - - - - No - copy requested No - copy requested  Would patient like information on creating a medical advance directive? - - - - - - -  Pre-existing out of facility DNR order (yellow form or pink MOST form) - - - - - - -    Tobacco Social History   Tobacco Use  Smoking  Status Former Smoker  . Packs/day: 0.25  . Years: 10.00  . Pack years: 2.50  . Types: Cigarettes  . Last attempt to quit: 03/01/2010  . Years since quitting: 7.3  Smokeless Tobacco Never Used     Counseling given: Yes   Clinical Intake:   Past Medical History:  Diagnosis Date  . Allergy   . Anxiety   . Aortic stenosis    Status post bioprosthetic AVR  . Arthritis    DJD  . Asthma   . Breast cancer ([place]) 12/13/2016   Left breast  . Chronic systolic CHF (congestive heart failure) ([place])    EF 30% 04/2014  . COPD (chronic obstructive pulmonary disease) ([place])   . Coronary artery disease    per pt, had LHC prior to AVR in [place] that did not show any blockages; no stents/bypass  . Gastritis   . Hyperlipidemia   . Hypertension   . Hyperthyroidism   . Multiple thyroid nodules   . Osteoporosis   . Paroxysmal atrial fibrillation (H[facility])   . Pelvis fracture ([place]) 08/19/2015   MULTIPLE   . Spontaneous pneumothorax 11/28/2015   left   . Stroke [facility]) 2000   rt hand weak   Past Surgical History:  Procedure Laterality Date  . BREAST LUMPECTOMY WITH RADIOACTIVE SEED AND SENTINEL LYMPH NODE  BIOPSY Left 01/14/2017   Procedure: LEFT BREAST LUMPECTOMY WITH RADIOACTIVE SEED AND SENTINEL LYMPH NODE BIOPSY;  Surgeon: Rolm Bookbinder, MD;  Location: [place];  Service: General;  Laterality: Left;  . CHEST TUBE INSERTION  10/2015  . INTRAMEDULLARY (IM) NAIL INTERTROCHANTERIC Left 12/10/2015   Procedure: INTRAMEDULLARY (IM) NAIL INTERTROCHANTRIC;  Surgeon: Leandrew Koyanagi, MD;  Location: [place];  Service: Orthopedics;  Laterality: Left;  . PLEURADESIS Left 12/03/2015   Procedure: PLEURADESIS;  Surgeon: Melrose Nakayama, MD;  Location: [place];  Service: Thoracic;  Laterality: Left;  . RESECTION OF APICAL BLEB Left 12/03/2015   Procedure: BLEBECTOMY;  Surgeon: Melrose Nakayama, MD;  Location: [place];  Service: Thoracic;  Laterality: Left;  . THORACOSCOPY  12/03/2015  . VALVE REPLACEMENT     . VIDEO ASSISTED THORACOSCOPY Left 12/03/2015   Procedure: VIDEO ASSISTED THORACOSCOPY;  Surgeon: Melrose Nakayama, MD;  Location: [facility] OR;  Service: Thoracic;  Laterality: Left;   Family History  Problem Relation Age of Onset  . Diabetes Mother   . Heart attack Mother 84  . Diabetes Father   . Lung cancer Father   . Diabetes Sister   . Thyroid disease Sister   . Diabetes Sister   . HIV Brother    Social History   Socioeconomic History  . Marital status: Married    Spouse name: Sherwood  . Number of children: 0  . Years of education: Not on file  . Highest education level: Not on file  Occupational History  . Occupation: Retired in 2004  Social Needs  . Financial resource strain: Not on file  . Food insecurity:    Worry: Not on file    Inability: Not on file  . Transportation needs:    Medical: Not on file    Non-medical: Not on file  Tobacco Use  . Smoking status: Former Smoker    Packs/day: 0.25    Years: 10.00    Pack years: 2.50    Types: Cigarettes    Last attempt to quit: 03/01/2010    Years since quitting: 7.3  . Smokeless tobacco: Never Used  Substance and Sexual Activity  . Alcohol use: No  . Drug use: No  . Sexual activity: Not on file  Lifestyle  . Physical activity:    Days per week: Not on file    Minutes per session: Not on file  . Stress: Not on file  Relationships  . Social connections:    Talks on phone: Not on file    Gets together: Not on file    Attends religious service: Not on file    Active member of club or organization: Not on file    Attends meetings of clubs or organizations: Not on file    Relationship status: Not on file  Other Topics Concern  . Not on file  Social History Narrative   Lives with husband.  Ambulated independently.    Outpatient Encounter Medications as of 06/21/2017  Medication Sig  . acetaminophen (TYLENOL) 500 MG tablet Take 1,000 mg by mouth daily as needed (PAIN).  Marland Kitchen alendronate (FOSAMAX) 70 MG tablet  Take 1 tablet (70 mg total) by mouth every Saturday. with a full glass of water on an empty stomach.  . ALPRAZolam (XANAX) 0.25 MG tablet Take 1 tablet (0.25 mg total) by mouth at bedtime as needed for anxiety or sleep.  Marland Kitchen aspirin EC 81 MG tablet Take 81 mg daily by mouth.  Marland Kitchen atorvastatin (LIPITOR) 80 MG  tablet Take 1 tablet (80 mg total) by mouth daily.  . budesonide-formoterol (SYMBICORT) 160-4.5 MCG/ACT inhaler Inhale 2 puffs into the lungs 2 (two) times daily.  . carvedilol (COREG) 25 MG tablet Take 1 tablet (25 mg total) by mouth 2 (two) times daily.  . Cholecalciferol (VITAMIN D3) 400 units tablet Take 1 tablet (400 Units total) by mouth daily.  . diclofenac sodium (VOLTAREN) 1 % GEL Apply 4 g topically 4 (four) times daily.  . diphenhydramine-acetaminophen (TYLENOL PM) 25-500 MG TABS tablet Take 2 tablets by mouth at bedtime as needed (SLEEP).   . furosemide (LASIX) 20 MG tablet Take 1 tablet (20 mg total) by mouth daily.  Marland Kitchen gabapentin (NEURONTIN) 100 MG capsule TAKE 1 CAPSULE BY MOUTH THREE TIMES DAILY  . losartan (COZAAR) 50 MG tablet Take 1 tablet by mouth daily  . methimazole (TAPAZOLE) 5 MG tablet TAKE 3 TABLETS BY MOUTH EVERY EVENING ( PATIENT NEEDS FOLLOW UP APPOINTMENT FOR FURTHER REFILLS)  . ranitidine (ZANTAC) 150 MG tablet Take 1 tablet (150 mg total) by mouth 2 (two) times daily.  Marland Kitchen senna-docusate (SENOKOT-S) 8.6-50 MG tablet Take 2 tablets at bedtime by mouth.   . tamoxifen (NOLVADEX) 20 MG tablet Take 1 tablet (20 mg total) by mouth daily.  . traMADol (ULTRAM) 50 MG tablet Take 1 tablet (50 mg total) by mouth 3 (three) times daily.  . valACYclovir (VALTREX) 500 MG tablet Take 1 tablet (500 mg total) by mouth 2 (two) times daily. For 3 days when outbreaks.  Marland Kitchen warfarin (COUMADIN) 3 MG tablet TAKE AS DIRECTED BY  ANTICOAGULATION  CLINIC  . warfarin (COUMADIN) 3 MG tablet Take as directed by anticoagulation clinic.   No facility-administered encounter medications on file as of  06/21/2017.     Activities of Daily Living In your present state of health, do you have any difficulty performing the following activities: 06/21/2017 01/17/2017  Hearing? N N  Vision? N N  Difficulty concentrating or making decisions? N N  Walking or climbing stairs? Y Y  Dressing or bathing? N N  Doing errands, shopping? [name]  Preparing Food and eating ? Y N  Using the Toilet? N N  In the past six months, have you accidently leaked urine? N Y  Comment - reports wears Depends when necessary  Do you have problems with loss of bowel control? N N  Managing your Medications? Y N  Managing your Finances? Y N  Housekeeping or managing your Housekeeping? N Y  Comment - Family assists  Some recent data might be hidden    Patient Care Team: [name], [name], MD as PCP - General (Family Medicine) End, Harrell Gave, MD as PCP - Cardiology (Cardiology)    Assessment:   This is a routine wellness examination for Danielle [name].  Exercise Activities and Dietary recommendations Current Exercise Habits: Home exercise routine  Goals    . Patient Stated     To walk without assistance and use a cane       Fall Risk Fall Risk  06/21/2017 05/03/2017 03/09/2017 03/08/2017 02/24/2017  Falls in the past year? No No (No Data) No (No Data)  Comment - - Denies new falls  - Patient denies new/ recent falls  Number falls in past yr: - - - - -  Injury with Fall? - - - - -  Risk Factor Category  - - - - -  Risk for fall due to : - - - - -  Risk for fall due to: Comment - - - - -  Follow up - - - - -     Depression Screen PHQ 2/9 Scores 06/21/2017 05/03/2017 01/18/2017 01/13/2017  PHQ - 2 Score 0 0 0 0  PHQ- 9 Score - - - -     Cognitive Function MMSE - Mini Mental State Exam 06/21/2017 05/20/2016  Not completed: (No Data) (No Data)   Ad8 score reviewed for issues:  Issues making decisions:  Less interest in hobbies / activities:  Repeats questions, stories (family complaining):  Trouble using  ordinary gadgets (microwave, computer, phone):  Forgets the month or year:   Mismanaging finances:   Remembering appts:  Daily problems with thinking and/or memory: Ad8 score is=0      Screening Tests Health Maintenance  Topic Date Due  . Hepatitis C Screening  08/23/1945  . COLONOSCOPY  01/14/1996  . DEXA SCAN  06/29/2018 (Originally 01/14/2011)  . INFLUENZA VACCINE  09/29/2017  . MAMMOGRAM  12/07/2018  . PNA vac Low Risk Adult  Completed        Plan:    PCP Notes   Health Maintenance Mammogram 11/2016; repeat this year, states cancer tx completed.  Dx osteoporosis; deferred dexa, just started fosamax x 72 yo  Had colonoscopy at [facility] prior to leaving [place] 2016; so this was completed 2015; she will try to get her report for Dr. [name]  Dexa scan 2012 per a note; do not see the result  Recent hx of fx pelvis 12/10/2015 No falls since    Abnormal Screens  none  Referrals  no  Patient concerns; States she rec'd 30 pills for tapazole when she is to take 3 per day and has to go back in for refills. Would like Dr. [name] to follow her thyroid disease. Please advise in basket note.  Told her to call DR. Ellison's office to have Rx corrected. Note states fup in 3 to 4 months  Nurse Concerns; As noted   Next PCP apt TBS      I have personally reviewed and noted the following in the patient's chart:   . Medical and social history . Use of alcohol, tobacco or illicit drugs  . Current medications and supplements . Functional ability and status . Nutritional status . Physical activity . Advanced directives . List of other physicians . Hospitalizations, surgeries, and ER visits in previous 12 months . Vitals . Screenings to include cognitive, depression, and falls . Referrals and appointments  In addition, I have reviewed and discussed with patient certain preventive protocols, quality metrics, and best practice recommendations. A written  personalized care plan for preventive services as well as general preventive health recommendations were provided to patient.     Wynetta Fines, RN  06/21/2017

## 2017-06-21 NOTE — Telephone Encounter (Signed)
Fup with MS. Pulliam at [place] her tapazole was ordered  3 per day  And she was given  30 pills for 30 day rx with no refills.   She request Dr. [name] follow her Thyroid disease.  Referred her back to Dr. Loanne Drilling to correct the rx for 3 tablets every evening and for refills until next fup.   Dr Cordelia Pen note states fup in 3 to 4 months.   Please advise or let Ms. Pulliam

## 2017-06-22 ENCOUNTER — Telehealth: Payer: Self-pay | Admitting: Endocrinology

## 2017-06-22 ENCOUNTER — Other Ambulatory Visit: Payer: Self-pay

## 2017-06-22 MED ORDER — METHIMAZOLE 5 MG PO TABS: ORAL_TABLET | ORAL | 1 refills | Status: DC

## 2017-06-22 NOTE — Telephone Encounter (Signed)
I have sent for 90 day supply to patient's pharmacy.

## 2017-06-22 NOTE — Telephone Encounter (Signed)
methimazole (TAPAZOLE) 5 MG tablet  Patient stated that the pharmacy stated that we would have to call in this prescription.    [place], [place] - [address]

## 2017-06-22 NOTE — Telephone Encounter (Signed)
error 

## 2017-06-22 NOTE — Telephone Encounter (Signed)
I called pharmacy & the stated that patient could fill on the 26th. I called patient & told her she only had a day to wait before insurance would approve to pay & fill it. I told her that it was sent for 90 pills not 30.

## 2017-06-22 NOTE — Telephone Encounter (Signed)
Call to Ms Danielle [name] and had to leave a VM Gave her my number for call back [phone] and that It appears her medicine was taken care of. Will try to outreach on Friday when I am back in this office for discuss continuation of service with Dr. Loanne Drilling per Dr. [name]

## 2017-06-24 ENCOUNTER — Ambulatory Visit: Payer: Medicare Other | Admitting: Physical Therapy

## 2017-06-27 ENCOUNTER — Ambulatory Visit: Payer: Medicare Other | Admitting: Physical Therapy

## 2017-06-27 ENCOUNTER — Encounter: Payer: Self-pay | Admitting: Physical Therapy

## 2017-06-27 DIAGNOSIS — R262 Difficulty in walking, not elsewhere classified: Secondary | ICD-10-CM

## 2017-06-27 DIAGNOSIS — M6281 Muscle weakness (generalized): Secondary | ICD-10-CM

## 2017-06-27 NOTE — Progress Notes (Signed)
I have reviewed documentation from this visit and I agree with recommendations given.  Clif Serio G. Birdell Frasier, MD  [facility]. [place] office.   

## 2017-06-28 NOTE — Therapy (Signed)
[facility] [address] [place], [place], [address] Phone: [phone]   Fax:  [phone]  Physical Therapy Treatment  Patient Details  Name: Danielle [name] MRN: [id number] Date of Birth: Feb 28, 1946 Referring Provider: [name], MD   Encounter Date: 06/27/2017  PT End of Session - 06/27/17 1542    Visit Number  6    Number of Visits  9    Date for PT Re-Evaluation  07/05/17    Authorization Type  [employer] MCR    Authorization Time Period  05/06/17 to 07/05/17    PT Start Time  4481    PT Stop Time  1615    PT Time Calculation (min)  40 min    Equipment Utilized During Treatment  Gait belt    Activity Tolerance  Patient tolerated treatment well    Behavior During Therapy  [facility] for tasks assessed/performed       Past Medical History:  Diagnosis Date  . Allergy   . Anxiety   . Aortic stenosis    Status post bioprosthetic AVR  . Arthritis    DJD  . Asthma   . Breast cancer ([place]) 12/13/2016   Left breast  . Chronic systolic CHF (congestive heart failure) ([place])    EF 30% 04/2014  . COPD (chronic obstructive pulmonary disease) ([place])   . Coronary artery disease    per pt, had LHC prior to AVR in [place] that did not show any blockages; no stents/bypass  . Gastritis   . Hyperlipidemia   . Hypertension   . Hyperthyroidism   . Multiple thyroid nodules   . Osteoporosis   . Paroxysmal atrial fibrillation ([facility])   . Pelvis fracture ([place]) 08/19/2015   MULTIPLE   . Spontaneous pneumothorax 11/28/2015   left   . Stroke [facility]) 2000   rt hand weak    Past Surgical History:  Procedure Laterality Date  . BREAST LUMPECTOMY WITH RADIOACTIVE SEED AND SENTINEL LYMPH NODE BIOPSY Left 01/14/2017   Procedure: LEFT BREAST LUMPECTOMY WITH RADIOACTIVE SEED AND SENTINEL LYMPH NODE BIOPSY;  Surgeon: Rolm Bookbinder, MD;  Location: [facility];  Service: General;  Laterality: Left;  . CHEST TUBE INSERTION   10/2015  . INTRAMEDULLARY (IM) NAIL INTERTROCHANTERIC Left 12/10/2015   Procedure: INTRAMEDULLARY (IM) NAIL INTERTROCHANTRIC;  Surgeon: Leandrew Koyanagi, MD;  Location: [facility];  Service: Orthopedics;  Laterality: Left;  . PLEURADESIS Left 12/03/2015   Procedure: PLEURADESIS;  Surgeon: Melrose Nakayama, MD;  Location: [facility];  Service: Thoracic;  Laterality: Left;  . RESECTION OF APICAL BLEB Left 12/03/2015   Procedure: BLEBECTOMY;  Surgeon: Melrose Nakayama, MD;  Location: [facility];  Service: Thoracic;  Laterality: Left;  . THORACOSCOPY  12/03/2015  . VALVE REPLACEMENT    . VIDEO ASSISTED THORACOSCOPY Left 12/03/2015   Procedure: VIDEO ASSISTED THORACOSCOPY;  Surgeon: Melrose Nakayama, MD;  Location: [facility];  Service: Thoracic;  Laterality: Left;    There were no vitals filed for this visit.  Subjective Assessment - 06/27/17 1540    Subjective  Continues to walk 10 laps 2x day in her house. Breathing has been better, uses inhalers as needed. No falls or pain to report.     Pertinent History  Arthritis, Left Breast Ca, CHF, COPD, HTN, osteoporosis, PAF, CVA (rt hand weak), 11/2015 fall with hip fracture     Limitations  Standing    How long can you stand comfortably?  "not long" that's what will realy make  my back and down my left leg hurt    How long can you walk comfortably?  < 5 minutes    Patient Stated Goals  walk with cane; ultimately walk with no cane    Currently in Pain?  Yes    Pain Score  7     Pain Location  Leg behind knee    Pain Orientation  Left    Pain Descriptors / Indicators  Aching    Pain Type  Chronic pain    Pain Onset  More than a month ago    Pain Frequency  Intermittent    Aggravating Factors   standing    Pain Relieving Factors  sitting, medication          [facility] Adult PT Treatment/Exercise - 06/27/17 1543      Transfers   Transfers  Sit to Stand;Stand to Sit    Sit to Stand  6: Modified independent (Device/Increase time);With upper extremity  assist;From bed;From chair/3-in-1    Stand to Sit  6: Modified independent (Device/Increase time);With upper extremity assist;To bed;To chair/3-in-1      Ambulation/Gait   Ambulation/Gait  Yes    Ambulation/Gait Assistance  6: Modified independent (Device/Increase time);4: Min guard mod I with RW    Ambulation/Gait Assistance Details  mod I with RW to enter/exit gym and to/from counter for balance activities. min guard to min assist with cane with cues for posture, correct cane placement, step length and weight shifting. with RW pt demo's equal step length with reciprocal steps. with cane pt demo's step to pattern with decreased stability. Needs continued work with cane before using outside of PT/home (pt reports using it at home and furniture walking at times with other UE)    Ambulation Distance (Feet)  55 Feet x2 with cane    Assistive device  Rolling walker    Gait Pattern  Step-through pattern;Decreased step length - right;Step-to pattern;Trunk flexed;Decreased dorsiflexion - right;Decreased dorsiflexion - left;Right foot flat;Left foot flat    Ambulation Surface  Level;Indoor      High Level Balance   High Level Balance Activities  Marching forwards;Tandem walking;Side stepping    High Level Balance Comments  at counter: attempted side stepping on red mats, after 2 laps down/back pt needed rest break due to increased knee pain; removed mats for remaining activities: 1 more lap of side stepping, then 3 laps each of marching fwd/bwd, tandem fwd/bwd. UE support on counter with contralateral HHA for balance. cues on posture and ex form/technique needed as well                     PT Short Term Goals - 06/08/17 1937      PT SHORT TERM GOAL #1   Title  Patient will be independent with her HEP addressing bil LE weakness and balance deficits. (Target for all STGs 06/05/17)    Time  4    Period  Weeks    Status  Achieved      PT SHORT TERM GOAL #2   Title  Patient will ambulate 115 ft with  cane and minguard assist.     Baseline  06/07/17 Able to walk maximum of 65 ft with cane with minguard assist. Limited by dyspnea and back pain/fatigue    Time  4    Period  Weeks    Status  Partially Met      PT SHORT TERM GOAL #3   Title  Patient will improve 5x sit  to stand to <=22 seconds to demonstrate improved LE strength and basic balance.     Baseline  26.7 sec; 06/07/17 15.03 sec     Time  4    Period  Weeks    Status  Achieved      PT SHORT TERM GOAL #4   Title  Patient will improve gait velocity to 1.81 ft/sec or more with LRAD to demonstrate decreasing fall risk and progress towards norm for her age (61.79 ft/sec)    Baseline  1.48 ft/sec; 06/07/17 with RW 1.78 ft/sec; with SPC 0.48 ft/sec    Time  4    Period  Weeks    Status  Partially Met        PT Long Term Goals - 06/08/17 1942      PT LONG TERM GOAL #1   Title  Patient will verbalize a plan for transition to community-based exercise program upon discharge from PT. (Target for all LTGs 07/05/17)    Time  8    Period  Weeks    Status  New      PT LONG TERM GOAL #2   Title  Patient will ambulate 115 ft with cane modified independent on level indoor terrain.    Time  8    Period  Weeks    Status  New      PT LONG TERM GOAL #3   Title  Pateint will improve 5x sit to stand to <=13.0 seconds to demonstrate decreasing fall risk and improving LE strength (norm for age=13.0 sec)    Baseline  Revised based on 4 week assessment= 15.08 sec    Time  8    Period  Weeks    Status  Revised      PT LONG TERM GOAL #4   Title  Patient will improve gait velocity to >=2.0 ft/sec with RW and >= 0.78 ft/sec with SPC demonstrating improved safety for limited community ambuation and closer to norm for her age of 2.79 ft/sec    Baseline  revised 06/07/17 based on velocity with cane 0.48 ft/sec, with RW 1.78 ft/sec    Time  8    Period  Weeks    Status  Revised          Plan - 06/27/17 1542    Clinical Impression Statement   Today's skilled session continued to address balance, LE strengthening and gait with cane. Pt continues to need increased support/assist with gait with cane and balance activiites. Pt is progressing toward goals with advanced balance activities performed today and increased distance with cane use. Pt should benefit from continued PT to progress toward unmet goals.     Rehab Potential  Good    Clinical Impairments Affecting Rehab Potential  Multiple comorbidities    PT Frequency  1x / week    PT Duration  8 weeks    PT Treatment/Interventions  ADLs/Self Care Home Management;Aquatic Therapy;DME Instruction;Gait training;Balance training;Therapeutic exercise;Therapeutic activities;Functional mobility training;Stair training;Patient/family education;Passive range of motion;Manual techniques    PT Next Visit Plan  continue to try to use a treadmill (pt goal); work on use of cane with quad or triangle tip (including stairs with cane and 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); LE strengthening and balance; Static standing increases back pain, therefore avoid this position for ex's     PT Home Exercise Plan  Walking program, STS, cubii 2x/day 30 min each, side-stepping, heel/toe raises, bridges, sidelying clam    Consulted and Agree with  Plan of Care  Patient       Patient will benefit from skilled therapeutic intervention in order to improve the following deficits and impairments:  Abnormal gait, Decreased activity tolerance, Decreased balance, Decreased mobility, Decreased knowledge of use of DME, Decreased endurance, Decreased coordination, Decreased range of motion, Decreased strength, Impaired flexibility, Impaired UE functional use, Postural dysfunction, Obesity, Pain(Pain was not an issue on evaluation, however she reports recent back and LLE pain. PT will not directly address pain as part of the POC, however will monitor for pain during sessions. )  Visit Diagnosis: Muscle weakness  (generalized)  Difficulty in walking, not elsewhere classified     Problem List Patient Active Problem List   Diagnosis Date Noted  . Recurrent genital herpes 04/13/2017  . Chronic pain disorder 01/25/2017  . Malignant neoplasm of upper-outer quadrant of left breast in female, estrogen receptor positive ([place]) 12/16/2016  . Chronic knee pain 12/07/2016  . Chronic bilateral low back pain without sciatica 12/07/2016  . Peripheral musculoskeletal gait disorder 12/07/2016  . Encounter for therapeutic drug monitoring 06/16/2016  . Generalized osteoarthritis of multiple sites 05/04/2016  . Chronic anticoagulation 05/04/2016  . Stroke ([place]) 04/14/2016  . Aortic valve disease 04/10/2016  . S/P AVR 04/10/2016  . NICM (nonischemic cardiomyopathy) ([place]) 04/10/2016  . Upper airway cough syndrome 03/14/2016  . Morbid obesity due to excess calories ([place]) 03/14/2016  . COPD GOLD 0  03/11/2016  . Thrombocytopenia ([place]) 12/12/2015  . Anemia 12/12/2015  . Vitamin D deficiency 12/12/2015  . Acute urinary retention 12/11/2015  . Osteoporosis 12/11/2015  . Hip fracture ([place]) 12/10/2015  . Aortic atherosclerosis ([place]) 12/10/2015  . Lung blebs ([place]) 12/03/2015  . Pelvic fracture ([place]) 08/19/2015  . Fall at home 08/19/2015  . Essential hypertension 08/19/2015  . Hyperlipidemia 08/19/2015  . Asthma 08/19/2015  . Atrial fibrillation ([place]) 08/19/2015  . Hyperthyroidism 08/19/2015  . Chronic combined systolic and diastolic CHF (congestive heart failure) ([place])   . 3-vessel CAD   . Aortic stenosis     Willow Ora, PTA, [facility] Outpatient Neuro [facility] [address] [place], [place] [address] [phone] 06/28/17, 4:10 PM   Name: Danielle [name] MRN: [id number] Date of Birth: 14-Jan-1946

## 2017-06-29 NOTE — Progress Notes (Signed)
[place]  [phone] [phone]) [phone]  Clinic Follow Up Note   Patient Care Team: [name], [name], MD as PCP - General (Family Medicine) End, Harrell Gave, MD as PCP - Cardiology (Cardiology) 07/04/2017  CHIEF COMPLAINTS:  Malignant neoplasm of upper-outer quadrant of left breast in female, estrogen receptor positive   Oncology History   Cancer Staging Malignant neoplasm of upper-outer quadrant of left breast in female, estrogen receptor positive ([place]) Staging form: Breast, AJCC 8th Edition - Clinical stage from 12/13/2016: Stage IB (cT2, cN0, cM0, G2, ER: Positive, PR: Positive, HER2: Negative) - Signed by [name], MD on 12/21/2016 - Pathologic stage from 01/14/2017: Stage IA (pT2, pN0, cM0, G1, ER: Positive, PR: Positive, HER2: Negative, Oncotype DX score: 4) - Signed by [name], MD on 04/17/2017       Malignant neoplasm of upper-outer quadrant of left breast in female, estrogen receptor positive ([place])   12/06/2016 Mammogram    Diagnostic Mammogram 12/06/16 IMPRESSION:  Suspicious mass in the left breast at 3 o'clock 2 cm from the nipple measuring 1.9 x 1.1 x 2.2 cm. RECOMMENDATION: Ultrasound-guided core biopsy of the mass in the 3 o'clock region of the left breast is recommended. The biopsy will be scheduled at the patient's convenience.      12/13/2016 Initial Biopsy    Diagnosis 12/13/16 Breast, left, needle core biopsy, 3:00 o'clock, 2cmfn - INVASIVE DUCTAL CARCINOMA - SEE COMMENT      12/16/2016 Initial Diagnosis    Malignant neoplasm of upper-outer quadrant of left breast in female, estrogen receptor positive ([place])      12/17/2016 Receptors her2    Estrogen Receptor: 100%, POSITIVE, STRONG STAINING INTENSITY Progesterone Receptor: 100%, POSITIVE, STRONG STAINING INTENSITY Proliferation Marker Ki67: 30% HER2 - NEGATIVE       01/14/2017 Surgery    Left breast lumpectomy with Dr. Donne Hazel      01/14/2017 Pathology  Results    Diagnosis 01/14/17 1. Breast, lumpectomy, Left - INVASIVE DUCTAL CARCINOMA, GRADE I/III, SPANNING 2.1 CM. - DUCTAL CARCINOMA IN SITU, LOW GRADE. - INVASIVE CARCINOMA IS BROADLY PRESENT AT THE INFERIOR MARGIN OF SPECIMEN 1. - DUCTAL CARCINOMA IN SITU IS FOCALLY PRESENT AT THE INFERIOR MARGIN OF SPECIMEN 1 AND BROADLY LESS THAN 0.1 CM TO THE LATERAL MARGIN OF SPECIMEN 1. - SEE ONCOLOGY TABLE BELOW. 2. Breast, excision, Additional medial margin left - DUCTAL CARCINOMA IN SITU, LOW GRADE. - DUCTAL CARCINOMA IS FOCALLY LESS THAN 0.1 CM TO THE NEW MARGIN OF SPECIMEN 2. 3. Breast, excision, Additional lateral margin left - DUCTAL CARCINOMA IN SITU, LOW GRADE. - DUCTAL CARCINOMA IN SITU IS GREATER THAN 0.2 CM TO ALL MARGINS. 4. Breast, excision, Additional superior margin left - DUCTAL CARCINOMA IN SITU, LOW GRADE. - DUCTAL CARCINOMA IN SITU IS BROADLY LESS THAN 0.1 CM TO THE NEW MARGIN OF SPECIMEN 4. 5. Lymph node, sentinel, biopsy, Left axillary - THERE IS NO EVIDENCE OF CARCINOMA IN 1 OF 1 LYMPH NODE (0/1). 6. Breast, excision, Additional inferior margin left - DUCTAL CARCINOMA IN SITU, LOW GRADE. - DUCTAL CARCINOMA IN SITU IS GREATER THAN 0.2 CM TO ALL MARGINS.       01/14/2017 Oncotype testing    Her oncotype recurrence score is 4 and her distance recurrent on Tamoxifen alone is 3%.      03/08/2017 - 04/05/2017 Radiation Therapy    RT with Dr. Lisbeth Renshaw        HISTORY OF PRESENTING ILLNESS: 12/22/16 Danielle [name] 72 y.o. female is here because  of newly diagnosed left breast cancer. She presents to breast clinic today with. Her best friend and husband.   In the past she had left hip surgery and had a rod placed last year. She did PT but will go back due to her trouble walking. She has arthritis and diagnosed with osteoporosis. She has been on Fosamax for the last 2 years. She does have back pain and knee pain arthritis. She had a stoke in 2000, she has CAD and  Afib. She had open heart surgery and experienced another stroke during operation. She does experience SOB when she lays flat. She does have COPD and uses inhaler. Her mother, father had lung cancer and 2 sisters had cancer. She quit smoking in 2012 and had smoked on and off for 10 years.   Today she notes having left breast pain initially that prompted the mammogram. She also had nipple soreness but no discharge or other breast change.  She ambulates with a walker from hip surgery. She is able to take care of herself at home but her husband helps her significantly at home. She had hot flashes during her menopause and now they are gone.    GYN HISTORY  Menarchal: 11 LMP: at 72 yo Contraceptive: no HRT: no GP: G0  CURRENT THERAPY: Adjuvant Tamoxifen 20 mg daily started on 04/20/17  INTERM HISTORY: Danielle [name] is here for follow up. She presents to the clinic today by herself. She reports she is doing well overall. She is compliant with Tamoxifen and reports no complaints with the medication. She states that every once in awhile she has left breast shooting pain near her incision. She reports her arthritis pain is unchanged since starting Tamoxifen.  On review of systems, pt denies any other complaints at this time. Pertinent positives are listed and detailed within the above HPI.   MEDICAL HISTORY:  Past Medical History:  Diagnosis Date  . Allergy   . Anxiety   . Aortic stenosis    Status post bioprosthetic AVR  . Arthritis    DJD  . Asthma   . Breast cancer ([place]) 12/13/2016   Left breast  . Chronic systolic CHF (congestive heart failure) ([place])    EF 30% 04/2014  . COPD (chronic obstructive pulmonary disease) ([place])   . Coronary artery disease    per pt, had LHC prior to AVR in [place] that did not show any blockages; no stents/bypass  . Gastritis   . Hyperlipidemia   . Hypertension   . Hyperthyroidism   . Multiple thyroid nodules   . Osteoporosis   .  Paroxysmal atrial fibrillation ([facility])   . Pelvis fracture ([place]) 08/19/2015   MULTIPLE   . Spontaneous pneumothorax 11/28/2015   left   . Stroke ([place]) 2000   rt hand weak    SURGICAL HISTORY: Past Surgical History:  Procedure Laterality Date  . BREAST LUMPECTOMY WITH RADIOACTIVE SEED AND SENTINEL LYMPH NODE BIOPSY Left 01/14/2017   Procedure: LEFT BREAST LUMPECTOMY WITH RADIOACTIVE SEED AND SENTINEL LYMPH NODE BIOPSY;  Surgeon: Rolm Bookbinder, MD;  Location: [facility];  Service: General;  Laterality: Left;  . CHEST TUBE INSERTION  10/2015  . INTRAMEDULLARY (IM) NAIL INTERTROCHANTERIC Left 12/10/2015   Procedure: INTRAMEDULLARY (IM) NAIL INTERTROCHANTRIC;  Surgeon: Leandrew Koyanagi, MD;  Location: [facility];  Service: Orthopedics;  Laterality: Left;  . PLEURADESIS Left 12/03/2015   Procedure: PLEURADESIS;  Surgeon: Melrose Nakayama, MD;  Location: [place];  Service: Thoracic;  Laterality: Left;  .  RESECTION OF APICAL BLEB Left 12/03/2015   Procedure: BLEBECTOMY;  Surgeon: Melrose Nakayama, MD;  Location: [place];  Service: Thoracic;  Laterality: Left;  . THORACOSCOPY  12/03/2015  . VALVE REPLACEMENT    . VIDEO ASSISTED THORACOSCOPY Left 12/03/2015   Procedure: VIDEO ASSISTED THORACOSCOPY;  Surgeon: Melrose Nakayama, MD;  Location: [facility];  Service: Thoracic;  Laterality: Left;    SOCIAL HISTORY: Social History   Socioeconomic History  . Marital status: Married    Spouse name: Sherwood  . Number of children: 0  . Years of education: Not on file  . Highest education level: Not on file  Occupational History  . Occupation: Retired in 2004  Social Needs  . Financial resource strain: Not on file  . Food insecurity:    Worry: Not on file    Inability: Not on file  . Transportation needs:    Medical: Not on file    Non-medical: Not on file  Tobacco Use  . Smoking status: Former Smoker    Packs/day: 0.25    Years: 10.00    Pack years: 2.50    Types: Cigarettes    Last attempt to  quit: 03/01/2010    Years since quitting: 7.3  . Smokeless tobacco: Never Used  Substance and Sexual Activity  . Alcohol use: No  . Drug use: No  . Sexual activity: Not on file  Lifestyle  . Physical activity:    Days per week: Not on file    Minutes per session: Not on file  . Stress: Not on file  Relationships  . Social connections:    Talks on phone: Not on file    Gets together: Not on file    Attends religious service: Not on file    Active member of club or organization: Not on file    Attends meetings of clubs or organizations: Not on file    Relationship status: Not on file  . Intimate partner violence:    Fear of current or ex partner: Not on file    Emotionally abused: Not on file    Physically abused: Not on file    Forced sexual activity: Not on file  Other Topics Concern  . Not on file  Social History Narrative   Lives with husband.  Ambulated independently.    FAMILY HISTORY: Family History  Problem Relation Age of Onset  . Diabetes Mother   . Heart attack Mother 20  . Diabetes Father   . Lung cancer Father   . Diabetes Sister   . Thyroid disease Sister   . Diabetes Sister   . HIV Brother     ALLERGIES:  is allergic to lisinopril and tetanus toxoid adsorbed.  MEDICATIONS:  Current Outpatient Medications  Medication Sig Dispense Refill  . acetaminophen (TYLENOL) 500 MG tablet Take 1,000 mg by mouth daily as needed (PAIN).    Marland Kitchen alendronate (FOSAMAX) 70 MG tablet Take 1 tablet (70 mg total) by mouth every Saturday. with a full glass of water on an empty stomach. 13 tablet 3  . ALPRAZolam (XANAX) 0.25 MG tablet Take 1 tablet (0.25 mg total) by mouth at bedtime as needed for anxiety or sleep. 30 tablet 0  . aspirin EC 81 MG tablet Take 81 mg daily by mouth.    Marland Kitchen atorvastatin (LIPITOR) 80 MG tablet Take 1 tablet (80 mg total) by mouth daily. 90 tablet 3  . budesonide-formoterol (SYMBICORT) 160-4.5 MCG/ACT inhaler Inhale 2 puffs into the lungs 2 (two)  times  daily. 1 Inhaler 0  . carvedilol (COREG) 25 MG tablet Take 1 tablet (25 mg total) by mouth 2 (two) times daily. 180 tablet 2  . Cholecalciferol (VITAMIN D3) 400 units tablet Take 1 tablet (400 Units total) by mouth daily. 30 tablet 0  . diclofenac sodium (VOLTAREN) 1 % GEL Apply 4 g topically 4 (four) times daily. 500 g 3  . diphenhydramine-acetaminophen (TYLENOL PM) 25-500 MG TABS tablet Take 2 tablets by mouth at bedtime as needed (SLEEP).     . furosemide (LASIX) 20 MG tablet Take 1 tablet (20 mg total) by mouth daily. 90 tablet 2  . gabapentin (NEURONTIN) 100 MG capsule TAKE 1 CAPSULE BY MOUTH THREE TIMES DAILY 90 capsule 2  . losartan (COZAAR) 50 MG tablet Take 1 tablet by mouth daily 90 tablet 3  . methimazole (TAPAZOLE) 5 MG tablet TAKE 3 TABLETS BY MOUTH EVERY EVENING ( PATIENT NEEDS FOLLOW UP APPOINTMENT FOR FURTHER REFILLS) 90 tablet 1  . ranitidine (ZANTAC) 150 MG tablet Take 1 tablet (150 mg total) by mouth 2 (two) times daily. 180 tablet 3  . senna-docusate (SENOKOT-S) 8.6-50 MG tablet Take 2 tablets at bedtime by mouth.     . tamoxifen (NOLVADEX) 20 MG tablet Take 1 tablet (20 mg total) by mouth daily. 90 tablet 3  . traMADol (ULTRAM) 50 MG tablet Take 1 tablet (50 mg total) by mouth 3 (three) times daily. 90 tablet 5  . valACYclovir (VALTREX) 500 MG tablet Take 1 tablet (500 mg total) by mouth 2 (two) times daily. For 3 days when outbreaks. 24 tablet 0  . warfarin (COUMADIN) 3 MG tablet TAKE AS DIRECTED BY  ANTICOAGULATION  CLINIC 40 tablet 2  . warfarin (COUMADIN) 3 MG tablet Take as directed by anticoagulation clinic. 40 tablet 2   No current facility-administered medications for this visit.     REVIEW OF SYSTEMS:   Constitutional: Denies fevers, chills or abnormal night sweats (+) insomnia  Eyes: Denies blurriness of vision, double vision or watery eyes Ears, nose, mouth, throat, and face: Denies mucositis or sore throat Respiratory: Denies cough,  Wheezes (+) SOB upon  laying flat  Cardiovascular: Denies palpitation, chest discomfort or lower extremity swelling Gastrointestinal:  Denies nausea, heartburn or change in bowel habits Skin: Denies abnormal skin rashes Lymphatics: Denies new lymphadenopathy or easy bruising Neurological:Denies numbness, tingling or new weaknesses MSK: (+) Rod in left hip post surgery, pain 7/10 (+) arthritis in Knee (+) lower back pain. (+) ambulates with walker Behavioral/Psych: Mood is stable, no new changes  All other systems were reviewed with the patient and are negative. BREAST: (+) left breast pain and nipple soreness   PHYSICAL EXAMINATION: ECOG PERFORMANCE STATUS: 2 - Symptomatic, <50% confined to bed  Vitals:   07/04/17 1447  BP: 140/82  Pulse: 78  Resp: (!) 22  Temp: 97.7 F (36.5 C)  SpO2: 97%   Filed Weights   07/04/17 1447  Weight: 239 lb (108.4 kg)    GENERAL:alert, no distress and comfortable SKIN: skin color, texture, turgor are normal, no rashes or significant lesions EYES: normal, conjunctiva are pink and non-injected, sclera clear OROPHARYNX:no exudate, no erythema and lips, buccal mucosa, and tongue normal  NECK: supple, thyroid normal size, non-tender, without nodularity LYMPH:  no palpable lymphadenopathy in the cervical, axillary or inguinal LUNGS: clear to auscultation and percussion with normal breathing effort HEART: regular rate & rhythm and no murmurs and no lower extremity edema ABDOMEN:abdomen soft, non-tender and normal bowel sounds  Musculoskeletal:no cyanosis of digits and no clubbing  PSYCH: alert & oriented x 3 with fluent speech NEURO: no focal motor/sensory deficits BREAST: deferred today   LABORATORY DATA:  I have reviewed the data as listed CBC Latest Ref Rng & Units 07/04/2017 04/13/2017 01/15/2017  WBC 3.9 - 10.3 K/uL 5.1 4.0 6.1  Hemoglobin 11.6 - 15.9 g/dL 12.3 13.5 11.3(L)  Hematocrit 34.8 - 46.6 % 38.1 40.4 35.5(L)  Platelets 145 - 400 K/uL 101(L) 119.0(L) 108(L)      CMP Latest Ref Rng & Units 07/04/2017 04/28/2017 04/13/2017  Glucose 70 - 140 mg/dL 83 103(H) 84  BUN 7 - 26 mg/dL _0 Creatinine 0.60 - 1.10 mg/dL 1.24(H) 1.14(H) 1.10  Sodium 136 - 145 mmol/L 138 141 139  Potassium 3.5 - 5.1 mmol/L 4.9 4.4 4.7  Chloride 98 - 109 mmol/L 109 104 105  CO2 22 - 29 mmol/L _1 Calcium 8.4 - 10.4 mg/dL 10.0 9.7 9.7  Total Protein 6.4 - 8.3 g/dL 7.0 - -  Total Bilirubin 0.2 - 1.2 mg/dL 0.4 - -  Alkaline Phos 40 - 150 U/L 62 - -  AST 5 - 34 U/L 14 - -  ALT 0 - 55 U/L 10 - -    PATHOLOGY  Diagnosis 12/13/16 Breast, left, needle core biopsy, 3:00 o'clock, 2cmfn - INVASIVE DUCTAL CARCINOMA - SEE COMMENT Microscopic Comment Based on the biopsy, the carcinoma appears Nottingham grade 2 of 3 and measures 0.8 cm in greatest linear extent. Prognostic markers (ER/PR/ki-67/HER2-FISH) are pending and will be reported in an addendum. Dr. Saralyn Pilar has reviewed the case and agrees with above diagnosis. These results were called to [facility] on December 14, 2016. PROGNOSTIC INDICATORS Results: IMMUNOHISTOCHEMICAL AND MORPHOMETRIC ANALYSIS PERFORMED MANUALLY Estrogen Receptor: 100%, POSITIVE, STRONG STAINING INTENSITY Progesterone Receptor: 100%, POSITIVE, STRONG STAINING INTENSITY Proliferation Marker Ki67: 30% FLUORESCENCE IN-SITU HYBRIDIZATION Results: HER2 - NEGATIVE RATIO OF HER2/CEP17 SIGNALS 1.11 AVERAGE HER2 COPY NUMBER PER CELL 1.95   Diagnosis 01/14/17 1. Breast, lumpectomy, Left - INVASIVE DUCTAL CARCINOMA, GRADE I/III, SPANNING 2.1 CM. - DUCTAL CARCINOMA IN SITU, LOW GRADE. - INVASIVE CARCINOMA IS BROADLY PRESENT AT THE INFERIOR MARGIN OF SPECIMEN 1. - DUCTAL CARCINOMA IN SITU IS FOCALLY PRESENT AT THE INFERIOR MARGIN OF SPECIMEN 1 AND BROADLY LESS THAN 0.1 CM TO THE LATERAL MARGIN OF SPECIMEN 1. - SEE ONCOLOGY TABLE BELOW. 2. Breast, excision, Additional medial margin left - DUCTAL CARCINOMA IN SITU, LOW  GRADE. - DUCTAL CARCINOMA IS FOCALLY LESS THAN 0.1 CM TO THE NEW MARGIN OF SPECIMEN 2. 3. Breast, excision, Additional lateral margin left - DUCTAL CARCINOMA IN SITU, LOW GRADE. - DUCTAL CARCINOMA IN SITU IS GREATER THAN 0.2 CM TO ALL MARGINS. 4. Breast, excision, Additional superior margin left - DUCTAL CARCINOMA IN SITU, LOW GRADE. - DUCTAL CARCINOMA IN SITU IS BROADLY LESS THAN 0.1 CM TO THE NEW MARGIN OF SPECIMEN 4. 5. Lymph node, sentinel, biopsy, Left axillary - THERE IS NO EVIDENCE OF CARCINOMA IN 1 OF 1 LYMPH NODE (0/1). 6. Breast, excision, Additional inferior margin left - DUCTAL CARCINOMA IN SITU, LOW GRADE. - DUCTAL CARCINOMA IN SITU IS GREATER THAN 0.2 CM TO ALL MARGINS. Microscopic Comment 1. BREAST, INVASIVE TUMOR Procedure: Seed localized lumpectomy with additional margin resections and axillary lymph node resection Laterality: Left Tumor Size: 2.1 cm (gross measurement) Histologic Type: Ductal Grade: I Tubular Differentiation: 2 Nuclear Pleomorphism: 2 1 of 4 FINAL for [name], Suraiya ([id number]) Microscopic Comment(continued) Mitotic  Count: 1 Ductal Carcinoma in Situ (DCIS): Preset, low grade, extensive Extent of Tumor: Confined to breast parenchyma. : Margins: Invasive carcinoma, distance from closest margin: Greater than 0.2 cm to all final surgical margins DCIS, distance from closest margin: Ductal carcinoma in situ is focally less than 0.1 cm to the new medial margin (specimen 2) and broadly less than 0.1 cm to new superior margin (specimen 4). Regional Lymph Nodes: Number of Lymph Nodes Examined: 1 Number of Sentinel Lymph Nodes Examined: 1 Lymph Nodes with Macrometastases: 0 Lymph Nodes with Micrometastases: 0 Lymph Nodes with Isolated Tumor Cells: 0 Breast Prognostic Profile: Case [id number] Estrogen Receptor: 100%, strong Progesterone Receptor: 100%, strong Her2: No amplification was detached. The ratio was 1.11 Ki-67: 30% Best  tumor block for sendout testing: 1A-1E Pathologic Stage Classification (pTNM, AJCC 8th Edition): Primary Tumor (pT): pT2 Regional Lymph Nodes (pN): pN0 Distant Metastases (pM): pMX (JBK:kh 01-18-17)     RADIOGRAPHIC STUDIES: I have personally reviewed the radiological images as listed and agreed with the findings in the report.  Diagnostic mammogram and [place] 12/06/16 IMPRESSION: Suspicious left breast mass.   ASSESSMENT & PLAN:  Danielle [name] is a 72 y.o. African-american female with a history of Aortic Stenosis, Arthritis, Asthma/COPD, CAD, CHF history of Stroke, Osteoporosis, HTN, HLD, Hyperthyroidism, and Afib.   1. Malignant neoplasm of upper-outer quadrant of left breast, Stage IB, c(T2,N0,M0 ), ER/PR: POSITIVE, HER2: NEGATIVE, Grade II ---pt underwent a left breast lumpectomy with Dr. Donne Hazel on 01/14/17. 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 Her margins also contained DCIS. There was no evidence of carcinoma in 1/1/ lymph node biopsied. I discussed her surgical path result in details -the Oncotype Dx result was previously reviewed with her in details. She has low risk based on the recurrence score, which predicts 10 year distant recurrence after 5 years of tamoxifen 3%.  There is no benefit of adjuvant chemotherapy for low risk disease. -she completed adjuvant RT with Dr. Lisbeth Renshaw from 03/08/17 - 04/05/17, tolerated well overall.  -She has started adjuvant tamoxifen, tolerating well, will continue for 5 to 10 years. -She is clinically doing well. Labs reviewed, she has mildly decreased kidney function and her platelets are 101K.  Her physical exam was unremarkable.  No clinical concern for recurrence. -She is due for a mammogram in Oct 2019, ordered today -Survivorship in 3 months -F/u in 6 months  2. Osteoporosis, Arthritis  -Underwent Left Hip surgery with rod placement in 2017, ambulates with walker -On Fosamax   3. CAD, Afib, CHF, Aortic  Stenosis, H/o Stroke  -On Coreg, Coumadin -follow-up with her primary care physician  PLAN:  -Continue Tamoxifen, refilled today -Mammogram in Oct 2019, ordered today -Survivorship clinic in 3 months -F/u in 6 months   No orders of the defined types were placed in this encounter.  All questions were answered. The patient knows to call the clinic with any problems, questions or concerns. I spent 15 minutes counseling the patient face to face. The total time spent in the appointment was 20 minutes and more than 50% was on counseling.   This document serves as a record of services personally performed by [name], MD. It was created on her behalf by Theresia Bough, a trained medical scribe. The creation of this record is based on the scribe's personal observations and the provider's statements to them.   I have reviewed the above documentation for accuracy and completeness, and I agree with the above.    [name], MD  07/04/2017 3:11 PM

## 2017-06-30 ENCOUNTER — Other Ambulatory Visit: Payer: Self-pay | Admitting: Hematology

## 2017-06-30 DIAGNOSIS — C50412 Malignant neoplasm of upper-outer quadrant of left female breast: Secondary | ICD-10-CM

## 2017-06-30 DIAGNOSIS — Z170 Estrogen receptor positive status [ER+]: Principal | ICD-10-CM

## 2017-07-01 NOTE — Telephone Encounter (Signed)
Call back to ms. Pulliam and noted that it appears Dr. Loanne Drilling did fup regarding her medication and that she prefers she follow with Dr. Loanne Drilling . Left number here at the office if she has questions. [phone] Tues, Wed and Friday)  Wynetta Fines RN

## 2017-07-04 ENCOUNTER — Inpatient Hospital Stay: Payer: Medicare Other | Attending: Hematology

## 2017-07-04 ENCOUNTER — Inpatient Hospital Stay (HOSPITAL_BASED_OUTPATIENT_CLINIC_OR_DEPARTMENT_OTHER): Payer: Medicare Other | Admitting: Hematology

## 2017-07-04 ENCOUNTER — Telehealth: Payer: Self-pay | Admitting: Hematology

## 2017-07-04 VITALS — BP 140/82 | HR 78 | Temp 97.7°F | Resp 22 | Ht 67.0 in | Wt 239.0 lb

## 2017-07-04 DIAGNOSIS — M810 Age-related osteoporosis without current pathological fracture: Secondary | ICD-10-CM | POA: Diagnosis not present

## 2017-07-04 DIAGNOSIS — Z8673 Personal history of transient ischemic attack (TIA), and cerebral infarction without residual deficits: Secondary | ICD-10-CM | POA: Diagnosis not present

## 2017-07-04 DIAGNOSIS — Z79810 Long term (current) use of selective estrogen receptor modulators (SERMs): Secondary | ICD-10-CM | POA: Diagnosis not present

## 2017-07-04 DIAGNOSIS — I350 Nonrheumatic aortic (valve) stenosis: Secondary | ICD-10-CM | POA: Insufficient documentation

## 2017-07-04 DIAGNOSIS — I110 Hypertensive heart disease with heart failure: Secondary | ICD-10-CM | POA: Insufficient documentation

## 2017-07-04 DIAGNOSIS — J449 Chronic obstructive pulmonary disease, unspecified: Secondary | ICD-10-CM | POA: Insufficient documentation

## 2017-07-04 DIAGNOSIS — M1990 Unspecified osteoarthritis, unspecified site: Secondary | ICD-10-CM | POA: Insufficient documentation

## 2017-07-04 DIAGNOSIS — I2510 Atherosclerotic heart disease of native coronary artery without angina pectoris: Secondary | ICD-10-CM | POA: Insufficient documentation

## 2017-07-04 DIAGNOSIS — Z7982 Long term (current) use of aspirin: Secondary | ICD-10-CM | POA: Diagnosis not present

## 2017-07-04 DIAGNOSIS — Z170 Estrogen receptor positive status [ER+]: Secondary | ICD-10-CM | POA: Insufficient documentation

## 2017-07-04 DIAGNOSIS — Z79899 Other long term (current) drug therapy: Secondary | ICD-10-CM | POA: Diagnosis not present

## 2017-07-04 DIAGNOSIS — Z801 Family history of malignant neoplasm of trachea, bronchus and lung: Secondary | ICD-10-CM | POA: Diagnosis not present

## 2017-07-04 DIAGNOSIS — I4891 Unspecified atrial fibrillation: Secondary | ICD-10-CM | POA: Diagnosis not present

## 2017-07-04 DIAGNOSIS — C50412 Malignant neoplasm of upper-outer quadrant of left female breast: Secondary | ICD-10-CM

## 2017-07-04 DIAGNOSIS — F419 Anxiety disorder, unspecified: Secondary | ICD-10-CM | POA: Diagnosis not present

## 2017-07-04 DIAGNOSIS — E785 Hyperlipidemia, unspecified: Secondary | ICD-10-CM | POA: Diagnosis not present

## 2017-07-04 DIAGNOSIS — Z87891 Personal history of nicotine dependence: Secondary | ICD-10-CM | POA: Diagnosis not present

## 2017-07-04 DIAGNOSIS — Z7901 Long term (current) use of anticoagulants: Secondary | ICD-10-CM | POA: Insufficient documentation

## 2017-07-04 DIAGNOSIS — I5022 Chronic systolic (congestive) heart failure: Secondary | ICD-10-CM | POA: Insufficient documentation

## 2017-07-04 DIAGNOSIS — Z923 Personal history of irradiation: Secondary | ICD-10-CM | POA: Diagnosis not present

## 2017-07-04 DIAGNOSIS — E039 Hypothyroidism, unspecified: Secondary | ICD-10-CM | POA: Diagnosis not present

## 2017-07-04 LAB — CBC WITH DIFFERENTIAL (CANCER CENTER ONLY)
Basophils Absolute: 0 10*3/uL (ref 0.0–0.1)
Basophils Relative: 0 %
EOS ABS: 0.3 10*3/uL (ref 0.0–0.5)
EOS PCT: 5 %
HCT: 38.1 % (ref 34.8–46.6)
Hemoglobin: 12.3 g/dL (ref 11.6–15.9)
LYMPHS ABS: 1.2 10*3/uL (ref 0.9–3.3)
LYMPHS PCT: 24 %
MCH: 31.5 pg (ref 25.1–34.0)
MCHC: 32.3 g/dL (ref 31.5–36.0)
MCV: 97.4 fL (ref 79.5–101.0)
Monocytes Absolute: 0.4 10*3/uL (ref 0.1–0.9)
Monocytes Relative: 8 %
Neutro Abs: 3.1 10*3/uL (ref 1.5–6.5)
Neutrophils Relative %: 63 %
PLATELETS: 101 10*3/uL — AB (ref 145–400)
RBC: 3.91 MIL/uL (ref 3.70–5.45)
RDW: 13.6 % (ref 11.2–14.5)
WBC: 5.1 10*3/uL (ref 3.9–10.3)

## 2017-07-04 LAB — CMP (CANCER CENTER ONLY)
ALT: 10 U/L (ref 0–55)
AST: 14 U/L (ref 5–34)
Albumin: 3.8 g/dL (ref 3.5–5.0)
Alkaline Phosphatase: 62 U/L (ref 40–150)
Anion gap: 2 — ABNORMAL LOW (ref 3–11)
BUN: 19 mg/dL (ref 7–26)
CHLORIDE: 109 mmol/L (ref 98–109)
CO2: 27 mmol/L (ref 22–29)
CREATININE: 1.24 mg/dL — AB (ref 0.60–1.10)
Calcium: 10 mg/dL (ref 8.4–10.4)
GFR, EST NON AFRICAN AMERICAN: 43 mL/min — AB (ref >60)
GFR, Est AFR Am: 49 mL/min — ABNORMAL LOW (ref >60)
Glucose, Bld: 83 mg/dL (ref 70–140)
POTASSIUM: 4.9 mmol/L (ref 3.5–5.1)
Sodium: 138 mmol/L (ref 136–145)
TOTAL PROTEIN: 7 g/dL (ref 6.4–8.3)
Total Bilirubin: 0.4 mg/dL (ref 0.2–1.2)

## 2017-07-04 MED ORDER — TAMOXIFEN CITRATE 20 MG PO TABS: 20.0000 mg | ORAL_TABLET | Freq: Every day | ORAL | 3 refills | Status: DC

## 2017-07-04 NOTE — Telephone Encounter (Signed)
Scheduled appt per 5/6 los -Gave patient AVS and calender per los.  

## 2017-07-05 ENCOUNTER — Encounter: Payer: Self-pay | Admitting: Hematology

## 2017-07-05 ENCOUNTER — Ambulatory Visit: Payer: Medicare Other | Admitting: Physical Therapy

## 2017-07-06 ENCOUNTER — Ambulatory Visit: Payer: Medicare Other

## 2017-07-06 LAB — POCT INR: INR: 2.2

## 2017-07-12 NOTE — Progress Notes (Signed)
I have reviewed documentation from this visit and I agree with recommendations given.  Tyrhonda Georgiades G. Eusebia Grulke, MD  [facility]. [place] office.   

## 2017-07-13 ENCOUNTER — Ambulatory Visit (INDEPENDENT_AMBULATORY_CARE_PROVIDER_SITE_OTHER): Payer: Medicare Other | Admitting: General Practice

## 2017-07-13 DIAGNOSIS — Z7901 Long term (current) use of anticoagulants: Secondary | ICD-10-CM | POA: Diagnosis not present

## 2017-07-13 NOTE — Patient Instructions (Addendum)
Pre visit review using our clinic review tool, if applicable. No additional management support is needed unless otherwise documented below in the visit note. ° °Continue to take 1 tablet daily except 1 1/2 tablets on Wednesdays. Re-check in 4 weeks. °

## 2017-07-17 NOTE — Progress Notes (Signed)
I have reviewed documentation from this visit and I agree with recommendations given.  Dayron Odland G. Jarelis Ehlert, MD  [facility]. [place] office.   

## 2017-07-17 NOTE — Progress Notes (Signed)
I have reviewed documentation from this visit and I agree with recommendations given.  [name]. [name], MD  [facility]. [place] office.   

## 2017-07-18 ENCOUNTER — Encounter: Payer: Self-pay | Admitting: Physical Therapy

## 2017-07-18 ENCOUNTER — Ambulatory Visit: Payer: Medicare Other | Attending: General Surgery | Admitting: Physical Therapy

## 2017-07-18 DIAGNOSIS — R262 Difficulty in walking, not elsewhere classified: Secondary | ICD-10-CM | POA: Insufficient documentation

## 2017-07-18 DIAGNOSIS — M6281 Muscle weakness (generalized): Secondary | ICD-10-CM | POA: Insufficient documentation

## 2017-07-18 DIAGNOSIS — R293 Abnormal posture: Secondary | ICD-10-CM | POA: Insufficient documentation

## 2017-07-18 NOTE — Patient Instructions (Addendum)
Bridge    Lie on back as shown in top picture. Tighten tummy and lift hips. Hold for 3 seconds counting out loud 03-998, 04-998, 04-998, then lower hips down  Repeat _10_ times. Do _1-2_ sessions per day.  http://pm.exer.us/55   Copyright  VHI. All rights reserved.   Heel Raises    Stand with support. Lift heels up as high as possible. Hold for 5 seconds counting out loud, 03-998, 04-998, 04-998, 05-998, 06-998, then lower heels down.  Repeat _10_ times. Do 1-2_ times a day.  Copyright  VHI. All rights reserved.      Functional Quadriceps: Sit to Stand    Sit on edge of chair, feet flat on floor. Stand up tall, then slowly sit back down. Use arms as needed with standing and for slow, controlled sitting.  Repeat _10_ times per set. Do _1_ sets per session. Do _1-2_ sessions per day.  http://orth.exer.us/735   Copyright  VHI. All rights reserved.

## 2017-07-19 NOTE — Therapy (Addendum)
[facility] [address] [place], [place], [address] Phone: [phone]   Fax:  [phone]  Physical Therapy Treatment  Patient Details  Name: Danielle [name] MRN: [id number] Date of Birth: 1945-11-22 Referring Provider: [name], MD   Encounter Date: 07/18/2017  PT End of Session - 07/18/17 1407    Visit Number  7    Number of Visits  13    Date for PT Re-Evaluation  08/26/17   Authorization Type  [employer] MCR    Authorization Time Period  05/06/17 to 07/05/17; 07/18/17 to 10/16/17   PT Start Time  1403    PT Stop Time  1445    PT Time Calculation (min)  42 min    Equipment Utilized During Treatment  Gait belt    Activity Tolerance  Patient tolerated treatment well    Behavior During Therapy  [facility] for tasks assessed/performed       Past Medical History:  Diagnosis Date  . Allergy   . Anxiety   . Aortic stenosis    Status post bioprosthetic AVR  . Arthritis    DJD  . Asthma   . Breast cancer ([place]) 12/13/2016   Left breast  . Chronic systolic CHF (congestive heart failure) ([place])    EF 30% 04/2014  . COPD (chronic obstructive pulmonary disease) ([place])   . Coronary artery disease    per pt, had LHC prior to AVR in [place] that did not show any blockages; no stents/bypass  . Gastritis   . Hyperlipidemia   . Hypertension   . Hyperthyroidism   . Multiple thyroid nodules   . Osteoporosis   . Paroxysmal atrial fibrillation (H[facility])   . Pelvis fracture ([place]) 08/19/2015   MULTIPLE   . Spontaneous pneumothorax 11/28/2015   left   . Stroke [facility]) 2000   rt hand weak    Past Surgical History:  Procedure Laterality Date  . BREAST LUMPECTOMY WITH RADIOACTIVE SEED AND SENTINEL LYMPH NODE BIOPSY Left 01/14/2017   Procedure: LEFT BREAST LUMPECTOMY WITH RADIOACTIVE SEED AND SENTINEL LYMPH NODE BIOPSY;  Surgeon: Rolm Bookbinder, MD;  Location: [place];  Service: General;  Laterality: Left;  . CHEST  TUBE INSERTION  10/2015  . INTRAMEDULLARY (IM) NAIL INTERTROCHANTERIC Left 12/10/2015   Procedure: INTRAMEDULLARY (IM) NAIL INTERTROCHANTRIC;  Surgeon: Leandrew Koyanagi, MD;  Location: [place];  Service: Orthopedics;  Laterality: Left;  . PLEURADESIS Left 12/03/2015   Procedure: PLEURADESIS;  Surgeon: Melrose Nakayama, MD;  Location: [place] [facility];  Service: Thoracic;  Laterality: Left;  . RESECTION OF APICAL BLEB Left 12/03/2015   Procedure: BLEBECTOMY;  Surgeon: Melrose Nakayama, MD;  Location: [place];  Service: Thoracic;  Laterality: Left;  . THORACOSCOPY  12/03/2015  . VALVE REPLACEMENT    . VIDEO ASSISTED THORACOSCOPY Left 12/03/2015   Procedure: VIDEO ASSISTED THORACOSCOPY;  Surgeon: Melrose Nakayama, MD;  Location: [place] [facility];  Service: Thoracic;  Laterality: Left;    There were no vitals filed for this visit.  Subjective Assessment - 07/18/17 1405    Subjective  Has been in [place] with spouse due to his son's death recently. Denies any falls or pain today. Still having issues with bridges with HEP and has started doing the tandem gait one more.     Patient is accompained by:  Family member    Pertinent History  Arthritis, Left Breast Ca, CHF, COPD, HTN, osteoporosis, PAF, CVA (rt hand weak), 11/2015 fall with hip fracture  Limitations  Standing    How long can you stand comfortably?  "not long" that's what will realy make my back and down my left leg hurt    How long can you walk comfortably?  < 5 minutes    Patient Stated Goals  walk with cane; ultimately walk with no cane    Currently in Pain?  No/denies    Pain Score  0-No pain         [facility] PT Assessment - 07/18/17 1411      Transfers   Transfers  Sit to Stand;Stand to Sit    Five time sit to stand comments   15.68 sec's with UE support, using standard height chair. dyspnea afterwards 2/4     Comments  needed cues for full upright standing with 5x sit to stand and for slowed descent with sitting down.       Ambulation/Gait    Ambulation/Gait  Yes    Ambulation/Gait Assistance  6: Modified independent (Device/Increase time);5: Supervision;4: Min assist;3: Mod assist;4: Min guard    Ambulation/Gait Assistance Details  Mod I with RW; with cane: initially min guard assist with cane for 10 meter walk, quickly fatigued with mod assist needed for last 5 feet of the total distance of 80 feet with knees buckling.     Ambulation Distance (Feet)  80 Feet x1 with cane; rest was with RW    Assistive device  Rolling walker;Straight cane    Gait Pattern  Step-through pattern;Decreased step length - right;Step-to pattern;Trunk flexed;Decreased dorsiflexion - right;Decreased dorsiflexion - left;Right foot flat;Left foot flat    Ambulation Surface  Level;Indoor    Gait velocity  13.69 with RW= 2.40 t/sec; 23.85 with cane= 1.38 ft/sec                PT Short Term Goals - 06/08/17 1937      PT SHORT TERM GOAL #1   Title  Patient will be independent with her HEP addressing bil LE weakness and balance deficits. (Target for all STGs 06/05/17)    Time  4    Period  Weeks    Status  Achieved      PT SHORT TERM GOAL #2   Title  Patient will ambulate 115 ft with cane and minguard assist.     Baseline  06/07/17 Able to walk maximum of 65 ft with cane with minguard assist. Limited by dyspnea and back pain/fatigue    Time  4    Period  Weeks    Status  Partially Met      PT SHORT TERM GOAL #3   Title  Patient will improve 5x sit to stand to <=22 seconds to demonstrate improved LE strength and basic balance.     Baseline  26.7 sec; 06/07/17 15.03 sec     Time  4    Period  Weeks    Status  Achieved      PT SHORT TERM GOAL #4   Title  Patient will improve gait velocity to 1.81 ft/sec or more with LRAD to demonstrate decreasing fall risk and progress towards norm for her age (62.79 ft/sec)    Baseline  1.48 ft/sec; 06/07/17 with RW 1.78 ft/sec; with SPC 0.48 ft/sec    Time  4    Period  Weeks    Status  Partially Met         PT Long Term Goals - 07/18/17 1407      PT LONG TERM GOAL #1  Title  Patient will verbalize a plan for transition to community-based exercise program upon discharge from PT. (Target for all LTGs 07/05/17)    Baseline  07/18/17: Is familiar to [facility]. has not checked into to date    Time  --    Period  --    Status  On-going      PT LONG TERM GOAL #2   Title  Patient will ambulate 115 ft with cane modified independent on level indoor terrain.    Baseline  07/18/17: max 80 feet today with varied assistance min guard increasing to mod assist as pt fatigued    Time  --    Period  --    Status  Not Met      PT LONG TERM GOAL #3   Title  Pateint will improve 5x sit to stand to <=13.0 seconds to demonstrate decreasing fall risk and improving LE strength (norm for age=13.0 sec)    Baseline  07/18/17: 15.68 sec's with UE support using standard height chair. Dyspnea 2/4 afterwards. (was 15.08 sec's at last assessment)    Time  --    Period  --    Status  Not Met      PT LONG TERM GOAL #4   Title  Patient will improve gait velocity to >=2.0 ft/sec with RW and >= 0.78 ft/sec with SPC demonstrating improved safety for limited community ambuation and closer to norm for her age of 2.79 ft/sec    Baseline  07/18/17: 2.40 ft/sec with RW, 1.38 ft/sec with cane    Time  --    Period  --    Status  Achieved         UPDATED PT Long Term Goals - 07/21/17 0735      PT LONG TERM GOAL #1   Title  Patient will verbalize a plan for transition to community-based exercise program upon discharge from PT. (Target for all LTGs 07/05/17) (Target for ongoing & new LTGs 08/26/17)    Baseline  07/18/17: Is familiar to [facility] has not checked into to date    Time  4    Period  Weeks    Status  On-going    Target Date  08/26/17 intended 4 week goals; pt unable to schedule first week due to appt availability, therefore date extended      PT LONG TERM GOAL #2   Title  Patient will  ambulate 80 ft with cane and supervision on level indoor terrain.    Baseline  07/18/17: max 80 feet today with varied assistance min guard increasing to mod assist as pt fatigued    Time  4    Period  Weeks    Status  Revised      PT LONG TERM GOAL #3   Title  Pateint will improve 5x sit to stand to <=13.0 seconds to demonstrate decreasing fall risk and improving LE strength (norm for age=13.0 sec)    Baseline  07/18/17: 15.68 sec's with UE support using standard height chair. Dyspnea 2/4 afterwards. (was 15.08 sec's at last assessment)    Time  4    Period  Weeks    Status  On-going      PT LONG TERM GOAL #4   Title  Patient will improve gait velocity to >=2.62 ft/sec with RW and >= 1.5 ft/sec with [facility] demonstrating improved safety for limited community ambuation and closer to norm for her age of 2.79 ft/sec    Baseline  07/18/17: 2.40 ft/sec with RW, 1.38 ft/sec with cane    Time  4    Period  Weeks    Status  Revised           Plan - 07/18/17 1407    Clinical Impression Statement  Today's skilled session focused on progress toward LTGs. Limited progress has been made due to pt missing several weeks due to a death in the family. She did demonstrate impoved gait speed with both the RW and straight cane. Pt should benefit from continued PT to progress strengthening and balance toward unmet goals. Primary PT to renew.     Rehab Potential  Good    Clinical Impairments Affecting Rehab Potential  Multiple comorbidities    PT Frequency  1x / week    PT Duration  4 weeks (recertify for 4 weeks)   PT Treatment/Interventions  ADLs/Self Care Home Management;Aquatic Therapy;DME Instruction;Gait training;Balance training;Therapeutic exercise;Therapeutic activities;Functional mobility training;Stair training;Patient/family education;Passive range of motion;Manual techniques    PT Next Visit Plan  continue to try to use a treadmill (pt goal); work on use of cane with quad or triangle tip (including  stairs with cane and 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); LE strengthening and balance; Static standing increases back pain, therefore avoid this position for ex's     PT Home Exercise Plan  Walking program, STS, cubii 2x/day 30 min each, side-stepping, heel/toe raises, bridges, sidelying clam    Consulted and Agree with Plan of Care  Patient    Family Member Consulted  Husband, [place]       Patient will benefit from skilled therapeutic intervention in order to improve the following deficits and impairments:  Abnormal gait, Decreased activity tolerance, Decreased balance, Decreased mobility, Decreased knowledge of use of DME, Decreased endurance, Decreased coordination, Decreased range of motion, Decreased strength, Impaired flexibility, Impaired UE functional use, Postural dysfunction, Obesity, Pain(Pain was not an issue on evaluation, however she reports recent back and LLE pain. PT will not directly address pain as part of the POC, however will monitor for pain during sessions. )  Visit Diagnosis: Muscle weakness (generalized)  Difficulty in walking, not elsewhere classified  Abnormal posture     Problem List Patient Active Problem List   Diagnosis Date Noted  . Recurrent genital herpes 04/13/2017  . Chronic pain disorder 01/25/2017  . Malignant neoplasm of upper-outer quadrant of left breast in female, estrogen receptor positive ([place]) 12/16/2016  . Chronic knee pain 12/07/2016  . Chronic bilateral low back pain without sciatica 12/07/2016  . Peripheral musculoskeletal gait disorder 12/07/2016  . Encounter for therapeutic drug monitoring 06/16/2016  . Generalized osteoarthritis of multiple sites 05/04/2016  . Chronic anticoagulation 05/04/2016  . Stroke ([place]) 04/14/2016  . Aortic valve disease 04/10/2016  . S/P AVR 04/10/2016  . NICM (nonischemic cardiomyopathy) ([place]) 04/10/2016  . Upper airway cough syndrome 03/14/2016  . Morbid obesity due to excess calories ([place])  03/14/2016  . COPD GOLD 0  03/11/2016  . Thrombocytopenia ([place]) 12/12/2015  . Anemia 12/12/2015  . Vitamin D deficiency 12/12/2015  . Acute urinary retention 12/11/2015  . Osteoporosis 12/11/2015  . Hip fracture ([place]) 12/10/2015  . Aortic atherosclerosis ([place]) 12/10/2015  . Lung blebs ([place]) 12/03/2015  . Pelvic fracture ([place]) 08/19/2015  . Fall at home 08/19/2015  . Essential hypertension 08/19/2015  . Hyperlipidemia 08/19/2015  . Asthma 08/19/2015  . Atrial fibrillation ([place]) 08/19/2015  . Hyperthyroidism 08/19/2015  . Chronic combined systolic and diastolic CHF (congestive heart failure) (  [place])   . 3-vessel CAD   . Aortic stenosis     Willow Ora, PTA, [facility] [address] [place], [place] [id number] [phone] 07/19/17, 8:00 PM     Name: Danielle [name] MRN: [id number] Date of Birth: 06-09-1945

## 2017-07-21 NOTE — Addendum Note (Signed)
Addended by: Rexanne Mano on: 07/21/2017 08:02 AM   Modules accepted: Orders

## 2017-07-28 ENCOUNTER — Ambulatory Visit: Payer: Medicare Other | Admitting: Physical Therapy

## 2017-08-01 ENCOUNTER — Ambulatory Visit: Payer: Medicare Other | Attending: General Surgery | Admitting: Physical Therapy

## 2017-08-01 ENCOUNTER — Encounter: Payer: Self-pay | Admitting: Physical Therapy

## 2017-08-01 DIAGNOSIS — R293 Abnormal posture: Secondary | ICD-10-CM | POA: Diagnosis not present

## 2017-08-01 DIAGNOSIS — R262 Difficulty in walking, not elsewhere classified: Secondary | ICD-10-CM | POA: Diagnosis not present

## 2017-08-01 DIAGNOSIS — M6281 Muscle weakness (generalized): Secondary | ICD-10-CM | POA: Diagnosis not present

## 2017-08-01 NOTE — Therapy (Signed)
[facility] [address] [place], [place], [address] Phone: [phone]   Fax:  [phone]  Physical Therapy Treatment  Patient Details  Name: Danielle [name] MRN: [id number] Date of Birth: 05-28-1945 Referring Provider: [name], MD   Encounter Date: 08/01/2017  PT End of Session - 08/01/17 1654    Visit Number  8 no visit 1/60; recert completed    Number of Visits  13 recertification 1/09    Date for PT Re-Evaluation  08/26/17    Authorization Type  [employer] MCR    Authorization Time Period  05/06/17 to 07/05/17; 07/18/17 to 10/16/17    PT Start Time  1535    PT Stop Time  1617    PT Time Calculation (min)  42 min    Equipment Utilized During Treatment  Gait belt    Activity Tolerance  Patient tolerated treatment well    Behavior During Therapy  [facility] for tasks assessed/performed       Past Medical History:  Diagnosis Date  . Allergy   . Anxiety   . Aortic stenosis    Status post bioprosthetic AVR  . Arthritis    DJD  . Asthma   . Breast cancer ([place]) 12/13/2016   Left breast  . Chronic systolic CHF (congestive heart failure) ([place])    EF 30% 04/2014  . COPD (chronic obstructive pulmonary disease) ([place])   . Coronary artery disease    per pt, had LHC prior to AVR in [place] that did not show any blockages; no stents/bypass  . Gastritis   . Hyperlipidemia   . Hypertension   . Hyperthyroidism   . Multiple thyroid nodules   . Osteoporosis   . Paroxysmal atrial fibrillation (HCC)   . Pelvis fracture ([place]) 08/19/2015   MULTIPLE   . Spontaneous pneumothorax 11/28/2015   left   . Stroke [facility]) 2000   rt hand weak    Past Surgical History:  Procedure Laterality Date  . BREAST LUMPECTOMY WITH RADIOACTIVE SEED AND SENTINEL LYMPH NODE BIOPSY Left 01/14/2017   Procedure: LEFT BREAST LUMPECTOMY WITH RADIOACTIVE SEED AND SENTINEL LYMPH NODE BIOPSY;  Surgeon: Rolm Bookbinder, MD;  Location: [facility];  Service: General;  Laterality: Left;  . CHEST TUBE INSERTION  10/2015  . INTRAMEDULLARY (IM) NAIL INTERTROCHANTERIC Left 12/10/2015   Procedure: INTRAMEDULLARY (IM) NAIL INTERTROCHANTRIC;  Surgeon: Leandrew Koyanagi, MD;  Location: [facility];  Service: Orthopedics;  Laterality: Left;  . PLEURADESIS Left 12/03/2015   Procedure: PLEURADESIS;  Surgeon: Melrose Nakayama, MD;  Location: [facility] [facility];  Service: Thoracic;  Laterality: Left;  . RESECTION OF APICAL BLEB Left 12/03/2015   Procedure: BLEBECTOMY;  Surgeon: Melrose Nakayama, MD;  Location: [facility];  Service: Thoracic;  Laterality: Left;  . THORACOSCOPY  12/03/2015  . VALVE REPLACEMENT    . VIDEO ASSISTED THORACOSCOPY Left 12/03/2015   Procedure: VIDEO ASSISTED THORACOSCOPY;  Surgeon: Melrose Nakayama, MD;  Location: [facility];  Service: Thoracic;  Laterality: Left;    There were no vitals filed for this visit.  Subjective Assessment - 08/01/17 1539    Subjective  WEnt to [facility] and had an orientation and was very sore in her legs from using the machines.     Patient is accompained by:  Family member    Pertinent History  Arthritis, Left Breast Ca, CHF, COPD, HTN, osteoporosis, PAF, CVA (rt hand weak), 11/2015 fall with hip fracture     Limitations  Standing  How long can you stand comfortably?  "not long" that's what will realy make my back and down my left leg hurt    How long can you walk comfortably?  < 5 minutes    Patient Stated Goals  walk with cane; ultimately walk with no cane    Currently in Pain?  No/denies                       [facility] Adult PT Treatment/Exercise - 08/01/17 1636      Transfers   Transfers  Sit to Stand;Stand to Sit    Sit to Stand  6: Modified independent (Device/Increase time);With armrests;From chair/3-in-1    Stand to Sit  6: Modified independent (Device/Increase time);With armrests;With upper extremity assist    Comments  reports still having problems with sit to stand and no UE use.  Reports bed is too high and too easy and chair is too difficult. Educated to put an extra pillow or cushion(s) in the chair       Ambulation/Gait   Ambulation/Gait Assistance  6: Modified independent (Device/Increase time);4: Min guard    Ambulation/Gait Assistance Details  modified independent with RW; supervision in // bars with single UE support and no UE support; minguard with cues for posture with SPC with rubber quad tip    Ambulation Distance (Feet)  60 Feet 160, 60, 120    Assistive device  Rolling walker;Straight cane;Parallel bars;None    Gait Pattern  Step-through pattern;Decreased step length - right;Trunk flexed;Decreased dorsiflexion - right;Decreased dorsiflexion - left;Right foot flat;Left foot flat;Trendelenburg;Poor foot clearance - left;Poor foot clearance - right    Ambulation Surface  Level;Indoor      Posture/Postural Control   Posture/Postural Control  Postural limitations    Postural Limitations  Rounded Shoulders;Forward head;Anterior pelvic tilt able to correct with cues while walking      Knee/Hip Exercises: Aerobic   Tread Mill  0.44mh x1108m, rest, 1 min, rest 0:45 sec, bil UE support; stopping due to feels SOB               PT Short Term Goals - 06/08/17 1937      PT SHORT TERM GOAL #1   Title  Patient will be independent with her HEP addressing bil LE weakness and balance deficits. (Target for all STGs 06/05/17)    Time  4    Period  Weeks    Status  Achieved      PT SHORT TERM GOAL #2   Title  Patient will ambulate 115 ft with cane and minguard assist.     Baseline  06/07/17 Able to walk maximum of 65 ft with cane with minguard assist. Limited by dyspnea and back pain/fatigue    Time  4    Period  Weeks    Status  Partially Met      PT SHORT TERM GOAL #3   Title  Patient will improve 5x sit to stand to <=22 seconds to demonstrate improved LE strength and basic balance.     Baseline  26.7 sec; 06/07/17 15.03 sec     Time  4    Period  Weeks     Status  Achieved      PT SHORT TERM GOAL #4   Title  Patient will improve gait velocity to 1.81 ft/sec or more with LRAD to demonstrate decreasing fall risk and progress towards norm for her age (2(68.79t/sec)    Baseline  1.48 ft/sec; 06/07/17 with  RW 1.78 ft/sec; with SPC 0.48 ft/sec    Time  4    Period  Weeks    Status  Partially Met        PT Long Term Goals - 07/21/17 0735      PT LONG TERM GOAL #1   Title  Patient will verbalize a plan for transition to community-based exercise program upon discharge from PT. (Target for all LTGs 07/05/17) (Target for ongoing & new LTGs 08/26/17)    Baseline  07/18/17: Is familiar to [facility]. has not checked into to date    Time  4    Period  Weeks    Status  On-going    Target Date  08/26/17 intended 4 week goals; pt unable to schedule first week due to appt availability, therefore date extended      PT LONG TERM GOAL #2   Title  Patient will ambulate 80 ft with cane and supervision on level indoor terrain.    Baseline  07/18/17: max 80 feet today with varied assistance min guard increasing to mod assist as pt fatigued    Time  4    Period  Weeks    Status  Revised      PT LONG TERM GOAL #3   Title  Pateint will improve 5x sit to stand to <=13.0 seconds to demonstrate decreasing fall risk and improving LE strength (norm for age=13.0 sec)    Baseline  07/18/17: 15.68 sec's with UE support using standard height chair. Dyspnea 2/4 afterwards. (was 15.08 sec's at last assessment)    Time  4    Period  Weeks    Status  On-going      PT LONG TERM GOAL #4   Title  Patient will improve gait velocity to >=2.62 ft/sec with RW and >= 1.5 ft/sec with [facility] demonstrating improved safety for limited community ambuation and closer to norm for her age of 2.79 ft/sec    Baseline  07/18/17: 2.40 ft/sec with RW, 1.38 ft/sec with cane    Time  4    Period  Weeks    Status  Revised            Plan - 08/01/17 1656    Clinical Impression  Statement  Skilled session focused on upright posture during gait (with RW, SPC, // bar, vs none). Patient able to walk much more upright with SPC compared to RW (her hips are slightly wider than RW and she does not feel comfortable getting up inside RW, so she walks slightly behind the handles and forward flexed). Despite patient's absence from PT due to death in the family, she has continued to progress her strength, endurance, and ambulation.     Rehab Potential  Good    Clinical Impairments Affecting Rehab Potential  Multiple comorbidities    PT Frequency  1x / week    PT Duration  4 weeks recertify for 4 weeks 6/97    PT Treatment/Interventions  ADLs/Self Care Home Management;Aquatic Therapy;DME Instruction;Gait training;Balance training;Therapeutic exercise;Therapeutic activities;Functional mobility training;Stair training;Patient/family education;Passive range of motion;Manual techniques    PT Next Visit Plan  continue to try to use a treadmill (pt goal-6/3 did 0.78mh); work on use of cane with quad (including stairs with cane and 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); LE strengthening and balance; Static standing increases back pain, therefore avoid this position for ex's     PT Home Exercise Plan  Walking program, STS, cubii 2x/day 30 min each,  side-stepping, heel/toe raises, bridges, sidelying clam    Consulted and Agree with Plan of Care  Patient    Family Member Consulted  Husband, [place]       Patient will benefit from skilled therapeutic intervention in order to improve the following deficits and impairments:  Abnormal gait, Decreased activity tolerance, Decreased balance, Decreased mobility, Decreased knowledge of use of DME, Decreased endurance, Decreased coordination, Decreased range of motion, Decreased strength, Impaired flexibility, Impaired UE functional use, Postural dysfunction, Obesity, Pain(Pain was not an issue on evaluation, however she reports recent back and LLE  pain. PT will not directly address pain as part of the POC, however will monitor for pain during sessions. )  Visit Diagnosis: Muscle weakness (generalized)  Difficulty in walking, not elsewhere classified  Abnormal posture     Problem List Patient Active Problem List   Diagnosis Date Noted  . Recurrent genital herpes 04/13/2017  . Chronic pain disorder 01/25/2017  . Malignant neoplasm of upper-outer quadrant of left breast in female, estrogen receptor positive ([place]) 12/16/2016  . Chronic knee pain 12/07/2016  . Chronic bilateral low back pain without sciatica 12/07/2016  . Peripheral musculoskeletal gait disorder 12/07/2016  . Encounter for therapeutic drug monitoring 06/16/2016  . Generalized osteoarthritis of multiple sites 05/04/2016  . Chronic anticoagulation 05/04/2016  . Stroke ([place]) 04/14/2016  . Aortic valve disease 04/10/2016  . S/P AVR 04/10/2016  . NICM (nonischemic cardiomyopathy) ([place]) 04/10/2016  . Upper airway cough syndrome 03/14/2016  . Morbid obesity due to excess calories ([place]) 03/14/2016  . COPD GOLD 0  03/11/2016  . Thrombocytopenia ([place]) 12/12/2015  . Anemia 12/12/2015  . Vitamin D deficiency 12/12/2015  . Acute urinary retention 12/11/2015  . Osteoporosis 12/11/2015  . Hip fracture ([place]) 12/10/2015  . Aortic atherosclerosis ([place]) 12/10/2015  . Lung blebs ([place]) 12/03/2015  . Pelvic fracture ([place]) 08/19/2015  . Fall at home 08/19/2015  . Essential hypertension 08/19/2015  . Hyperlipidemia 08/19/2015  . Asthma 08/19/2015  . Atrial fibrillation ([place]) 08/19/2015  . Hyperthyroidism 08/19/2015  . Chronic combined systolic and diastolic CHF (congestive heart failure) ([place])   . 3-vessel CAD   . Aortic stenosis     Rexanne Mano, PT 08/01/2017, 5:01 PM  [facility] [address] [place], [place], [address] Phone: [phone]   Fax:  [phone]  Name: Danielle [name] MRN:  [id number] Date of Birth: 1945/04/16

## 2017-08-03 ENCOUNTER — Ambulatory Visit (INDEPENDENT_AMBULATORY_CARE_PROVIDER_SITE_OTHER): Payer: Medicare Other | Admitting: General Practice

## 2017-08-03 DIAGNOSIS — Z7901 Long term (current) use of anticoagulants: Secondary | ICD-10-CM | POA: Diagnosis not present

## 2017-08-03 LAB — POCT INR: INR: 2.5 (ref 2.0–3.0)

## 2017-08-03 NOTE — Patient Instructions (Addendum)
Pre visit review using our clinic review tool, if applicable. No additional management support is needed unless otherwise documented below in the visit note. ° °Continue to take 1 tablet daily except 1 1/2 tablets on Wednesdays. Re-check in 4 weeks. °

## 2017-08-04 DIAGNOSIS — C50512 Malignant neoplasm of lower-outer quadrant of left female breast: Secondary | ICD-10-CM | POA: Diagnosis not present

## 2017-08-07 IMAGING — CR DG CHEST 1V PORT
1 series · 1 of 1 positions shown · non-contrast
Comparison: Chest radiograph from one day prior.

CLINICAL DATA: Follow-up left pneumothorax

EXAM:
PORTABLE CHEST 1 VIEW

[AP]
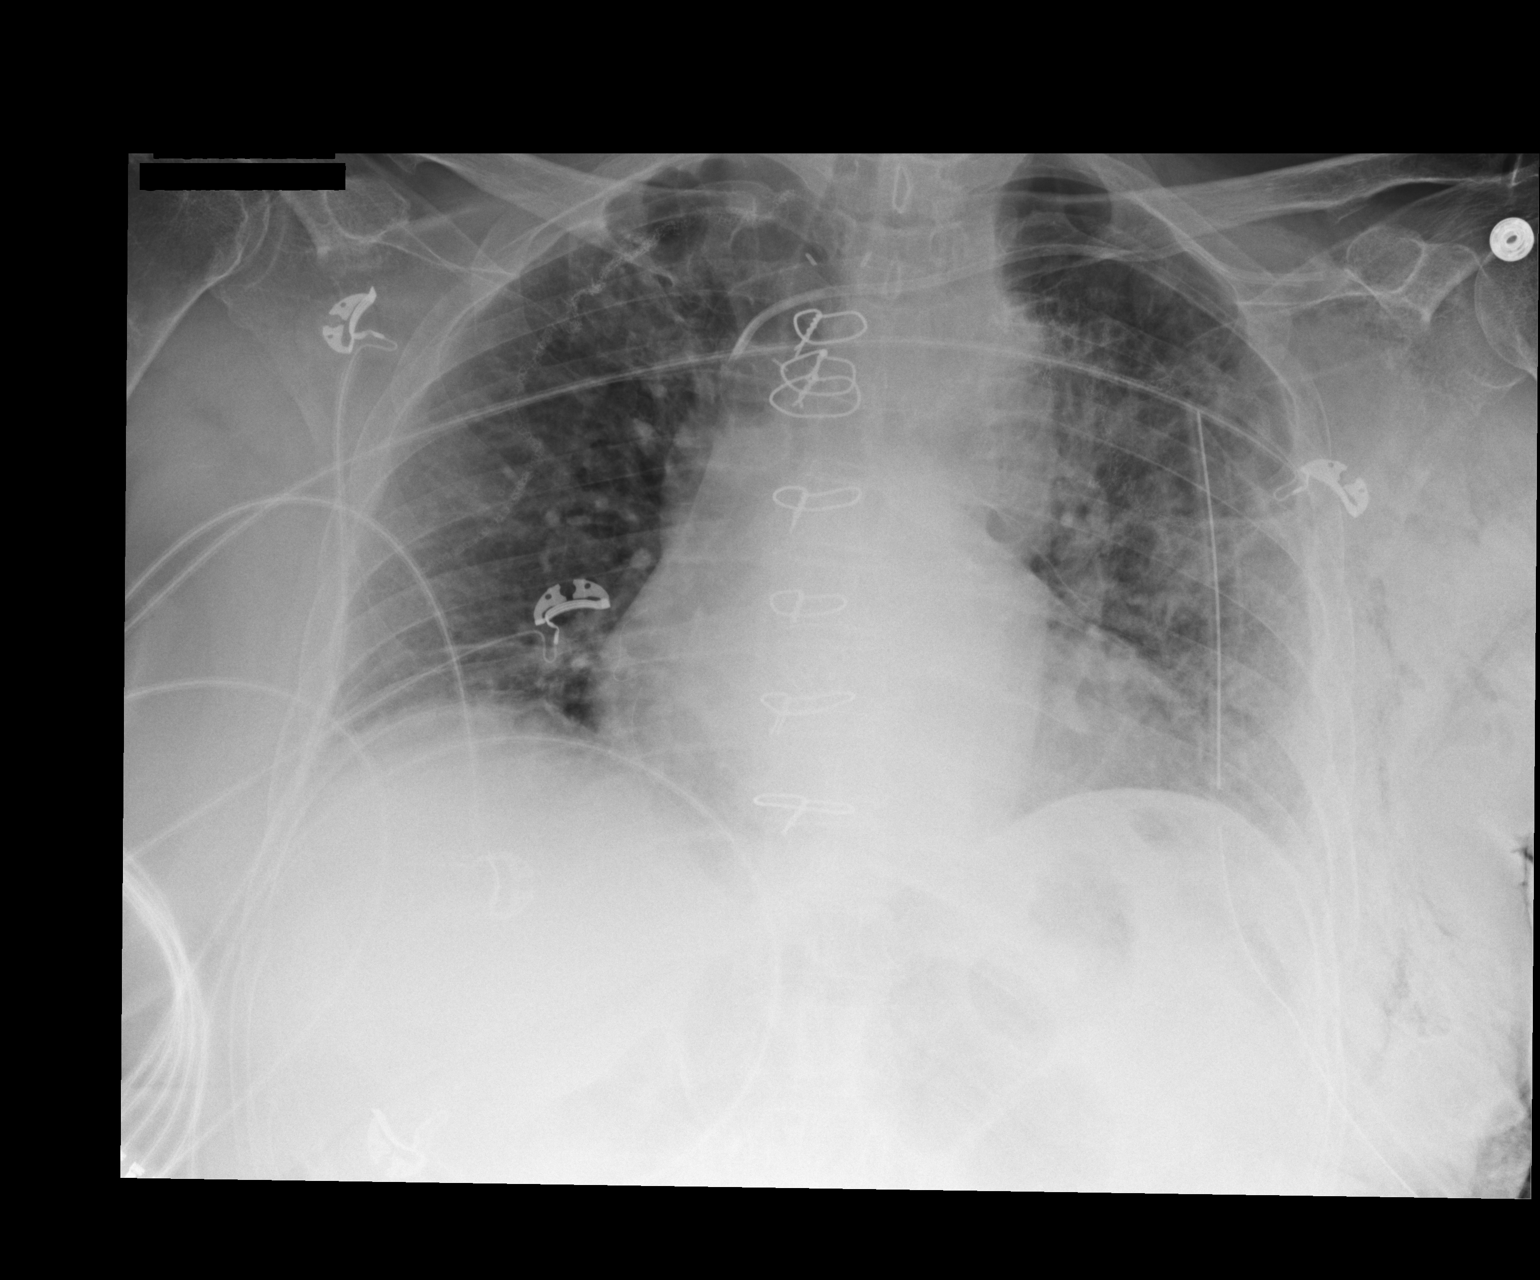

[1 of 1 positions shown; findings below may reference images not displayed]

FINDINGS: Interval removal of 1 of the left chest tubes. Remaining left chest
tube terminates in the mid to upper left pleural space. Left
subclavian central venous catheter terminates in the upper third of
the superior vena cava. Sternotomy wires appear aligned and intact.
Surgical sutures overlie the mid to upper lungs bilaterally. Stable
cardiomediastinal silhouette with normal heart size. No right
pneumothorax. There is a new curvilinear contour overlying the left
upper lung, favored to represent an increased small left apical
pneumothorax despite the presence of lung markings extending
peripheral to this contour. No pleural effusion. Patchy opacity in
the left lung is stable, favor atelectasis. No new focal airspace
opacity. Stable subcutaneous emphysema in the left lateral chest
wall.
IMPRESSION: 1. Small left apical pneumothorax appears increased. No change in
position of the mediastinum. Recommend attention on short-term
follow-up chest radiograph.
2. Stable patchy left lung opacity, favor atelectasis.
Critical Value/emergent results were called by telephone at the time
of interpretation on 12/05/2015 at [DATE] to RN RAJESH GOSSELIN, who
verbally acknowledged these results.

## 2017-08-08 ENCOUNTER — Encounter: Payer: Self-pay | Admitting: Physical Therapy

## 2017-08-08 ENCOUNTER — Ambulatory Visit: Payer: Medicare Other | Admitting: Physical Therapy

## 2017-08-08 DIAGNOSIS — R262 Difficulty in walking, not elsewhere classified: Secondary | ICD-10-CM | POA: Diagnosis not present

## 2017-08-08 DIAGNOSIS — R293 Abnormal posture: Secondary | ICD-10-CM | POA: Diagnosis not present

## 2017-08-08 DIAGNOSIS — M6281 Muscle weakness (generalized): Secondary | ICD-10-CM | POA: Diagnosis not present

## 2017-08-08 IMAGING — CR DG CHEST 1V PORT
1 series · 1 of 1 positions shown · non-contrast
Comparison: December 05, 2015

CLINICAL DATA: Shortness of Breath

EXAM:
PORTABLE CHEST 1 VIEW

[AP]
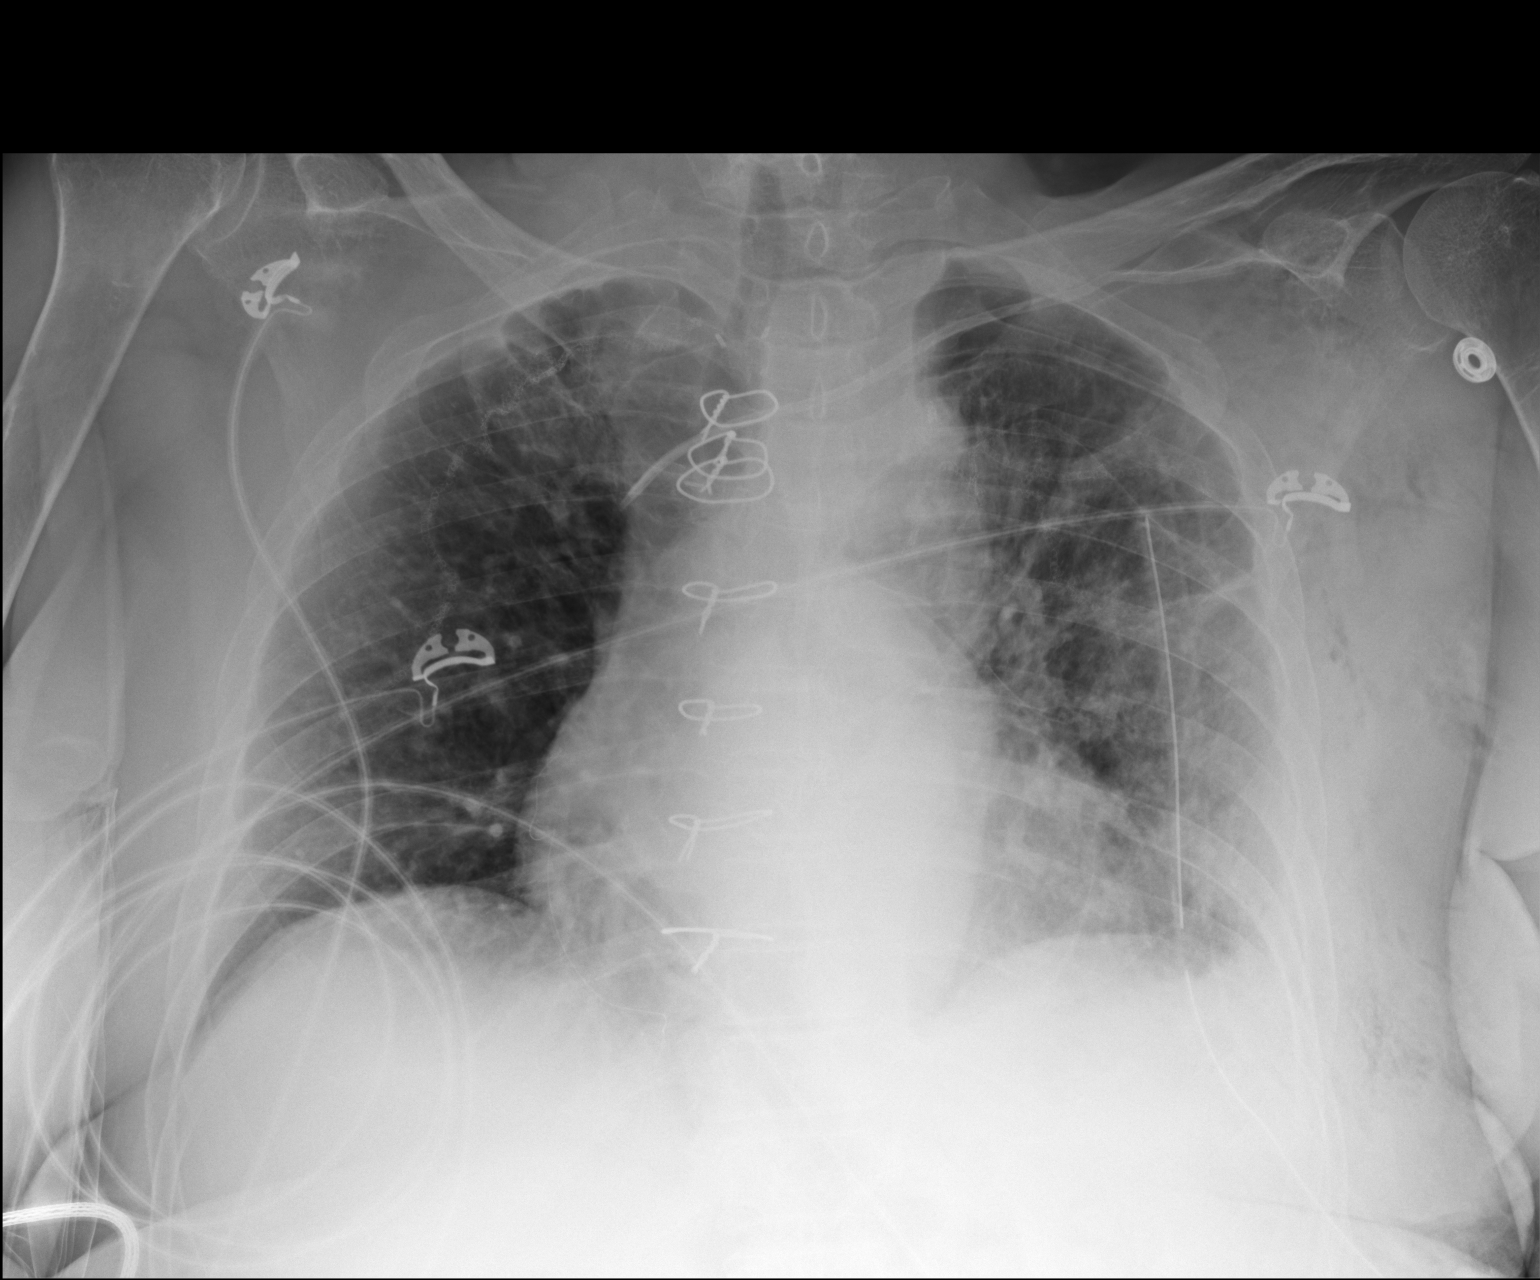

[1 of 1 positions shown; findings below may reference images not displayed]

FINDINGS: Chest tube remains on the left. Central catheter tip is in the
superior vena cava just beyond the junction with the left innominate
vein. There is a left apical pneumothorax which may be marginally
smaller than on study from 1 day prior. There is subcutaneous air on
the left which appears essentially stable. There is postoperative
change in the right apex. There is patchy atelectasis throughout the
left lung, stable. No new opacity is evident. Heart is upper normal
in size with pulmonary vascularity within normal limits. There is
atherosclerotic calcification in the aorta. No adenopathy evident.
No bone lesions.
IMPRESSION: Chest tube remains on the left with small left apical pneumothorax,
slightly smaller compared to 1 day prior. Patchy atelectasis remains
on the left. Postoperative change on the right near the apex. Stable
cardiac silhouette. No new parenchymal lung opacity.

## 2017-08-08 NOTE — Therapy (Signed)
[facility] [address] [place], [place], [address] Phone: [phone]   Fax:  [phone]  Physical Therapy Treatment  Patient Details  Name: Danielle [name] MRN: [id number] Date of Birth: [birth year] Referring Provider: [name], MD   Encounter Date: 08/08/2017  PT End of Session - 08/08/17 1600    Visit Number  8 no visit 1/02; recert completed    Number of Visits  13 recertification 1/11    Date for PT Re-Evaluation  08/26/17    Authorization Type  [employer] MCR    Authorization Time Period  05/06/17 to 07/05/17; 07/18/17 to 10/16/17    PT Start Time  1404    PT Stop Time  1449    PT Time Calculation (min)  45 min    Activity Tolerance  Patient tolerated treatment well    Behavior During Therapy  [facility] for tasks assessed/performed       Past Medical History:  Diagnosis Date  . Allergy   . Anxiety   . Aortic stenosis    Status post bioprosthetic AVR  . Arthritis    DJD  . Asthma   . Breast cancer ([place]) 12/13/2016   Left breast  . Chronic systolic CHF (congestive heart failure) ([place])    EF 30% 04/2014  . COPD (chronic obstructive pulmonary disease) ([place])   . Coronary artery disease    per pt, had LHC prior to AVR in [place] that did not show any blockages; no stents/bypass  . Gastritis   . Hyperlipidemia   . Hypertension   . Hyperthyroidism   . Multiple thyroid nodules   . Osteoporosis   . Paroxysmal atrial fibrillation (H[facility])   . Pelvis fracture ([place]) 08/19/2015   MULTIPLE   . Spontaneous pneumothorax 11/28/2015   left   . Stroke [facility]) 2000   rt hand weak    Past Surgical History:  Procedure Laterality Date  . BREAST LUMPECTOMY WITH RADIOACTIVE SEED AND SENTINEL LYMPH NODE BIOPSY Left 01/14/2017   Procedure: LEFT BREAST LUMPECTOMY WITH RADIOACTIVE SEED AND SENTINEL LYMPH NODE BIOPSY;  Surgeon: Rolm Bookbinder, MD;  Location: [facility];  Service: General;  Laterality: Left;  .  CHEST TUBE INSERTION  10/2015  . INTRAMEDULLARY (IM) NAIL INTERTROCHANTERIC Left 12/10/2015   Procedure: INTRAMEDULLARY (IM) NAIL INTERTROCHANTRIC;  Surgeon: Leandrew Koyanagi, MD;  Location: [facility];  Service: Orthopedics;  Laterality: Left;  . PLEURADESIS Left 12/03/2015   Procedure: PLEURADESIS;  Surgeon: Melrose Nakayama, MD;  Location: [facility];  Service: Thoracic;  Laterality: Left;  . RESECTION OF APICAL BLEB Left 12/03/2015   Procedure: BLEBECTOMY;  Surgeon: Melrose Nakayama, MD;  Location: [facility];  Service: Thoracic;  Laterality: Left;  . THORACOSCOPY  12/03/2015  . VALVE REPLACEMENT    . VIDEO ASSISTED THORACOSCOPY Left 12/03/2015   Procedure: VIDEO ASSISTED THORACOSCOPY;  Surgeon: Melrose Nakayama, MD;  Location: [place];  Service: Thoracic;  Laterality: Left;    There were no vitals filed for this visit.  Subjective Assessment - 08/08/17 1411    Subjective  Has been working on sit to stand from the edge of bed and can do 5x in a row without using her UEs!    Patient is accompained by:  Family member    Pertinent History  Arthritis, Left Breast Ca, CHF, COPD, HTN, osteoporosis, PAF, CVA (rt hand weak), 11/2015 fall with hip fracture     Limitations  Standing    How long can you stand comfortably?  "  not long" that's what will realy make my back and down my left leg hurt    How long can you walk comfortably?  < 5 minutes    Patient Stated Goals  walk with cane; ultimately walk with no cane    Currently in Pain?  No/denies                       [facility] Adult PT Treatment/Exercise - 08/08/17 1415      Knee/Hip Exercises: Supine   Bridges  Sit to stand   Strengthening;Both;2 sets;10 reps 3 second hold   5 reps x 3 sets; min use of hands after 8 reps     Gait training-alternated with RW and tripod tip cane for improved timing and carryover when using cane (pt is very start/stop with cane). Max distance 80 ft (x 5). Patient able to negotiate between tables and chairs  to simulate home set-up. Educated in use of counter with single UE support and other hand on her cane with focus on consecutive stepping.   Therapeutic activities-initiated floor to furniture transfer by having pt kneel on 14" box with 1" foam and hands on mat table in front of her. Practiced lifting off her knee and stepping that leg forward beside the 14" box (precursor to 1/2 kneeling). Pt repeated 10 reps with each leg (was able to bend and bring RLe forward better than her LLE--and neither knee bend enough to imagine she will be able to do true 1/2 kneeling). Then turned and sat down on 14" box plus 1" foam and "bumped" back onto mat table (~3 inches higher) and repeated x 10.          PT Short Term Goals - 06/08/17 1937      PT SHORT TERM GOAL #1   Title  Patient will be independent with her HEP addressing bil LE weakness and balance deficits. (Target for all STGs 06/05/17)    Time  4    Period  Weeks    Status  Achieved      PT SHORT TERM GOAL #2   Title  Patient will ambulate 115 ft with cane and minguard assist.     Baseline  06/07/17 Able to walk maximum of 65 ft with cane with minguard assist. Limited by dyspnea and back pain/fatigue    Time  4    Period  Weeks    Status  Partially Met      PT SHORT TERM GOAL #3   Title  Patient will improve 5x sit to stand to <=22 seconds to demonstrate improved LE strength and basic balance.     Baseline  26.7 sec; 06/07/17 15.03 sec     Time  4    Period  Weeks    Status  Achieved      PT SHORT TERM GOAL #4   Title  Patient will improve gait velocity to 1.81 ft/sec or more with LRAD to demonstrate decreasing fall risk and progress towards norm for her age (31.79 ft/sec)    Baseline  1.48 ft/sec; 06/07/17 with RW 1.78 ft/sec; with SPC 0.48 ft/sec    Time  4    Period  Weeks    Status  Partially Met        PT Long Term Goals - 07/21/17 0735      PT LONG TERM GOAL #1   Title  Patient will verbalize a plan for transition to  community-based exercise program upon discharge from  PT. (Target for all LTGs 07/05/17) (Target for ongoing & new LTGs 08/26/17)    Baseline  07/18/17: Is familiar to [facility]. has not checked into to date    Time  4    Period  Weeks    Status  On-going    Target Date  08/26/17 intended 4 week goals; pt unable to schedule first week due to appt availability, therefore date extended      PT LONG TERM GOAL #2   Title  Patient will ambulate 80 ft with cane and supervision on level indoor terrain.    Baseline  07/18/17: max 80 feet today with varied assistance min guard increasing to mod assist as pt fatigued    Time  4    Period  Weeks    Status  Revised      PT LONG TERM GOAL #3   Title  Pateint will improve 5x sit to stand to <=13.0 seconds to demonstrate decreasing fall risk and improving LE strength (norm for age=13.0 sec)    Baseline  07/18/17: 15.68 sec's with UE support using standard height chair. Dyspnea 2/4 afterwards. (was 15.08 sec's at last assessment)    Time  4    Period  Weeks    Status  On-going      PT LONG TERM GOAL #4   Title  Patient will improve gait velocity to >=2.62 ft/sec with RW and >= 1.5 ft/sec with [facility] demonstrating improved safety for limited community ambuation and closer to norm for her age of 2.79 ft/sec    Baseline  07/18/17: 2.40 ft/sec with RW, 1.38 ft/sec with cane    Time  4    Period  Weeks    Status  Revised            Plan - 08/08/17 1601    Clinical Impression Statement  Patient making slow, steady progress. She continues to be much more efficient with her gait when using RW, however she really wants to use her cane. Discussed pro-s and con's of each (again). Patient stated her ultimate goal is to not fall again and reminded her that the RW gives her more support to avoid falling.     Rehab Potential  Good    Clinical Impairments Affecting Rehab Potential  Multiple comorbidities    PT Frequency  1x / week    PT Duration  4 weeks  recertify for 4 weeks 2/95    PT Treatment/Interventions  ADLs/Self Care Home Management;Aquatic Therapy;DME Instruction;Gait training;Balance training;Therapeutic exercise;Therapeutic activities;Functional mobility training;Stair training;Patient/family education;Passive range of motion;Manual techniques    PT Next Visit Plan  continue to try to use a treadmill (pt goal-6/3 did 0.60mh); work on use of cane with quad tip; stairs with cane and 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; LE strengthening and balance; Static standing increases back pain, therefore avoid this position for ex's; ? precursor to floor to furniture transfers.    PT Home Exercise Plan  Walking program, STS, cubii 2x/day 30 min each, side-stepping, heel/toe raises, bridges, sidelying clam    Consulted and Agree with Plan of Care  Patient    Family Member Consulted  Husband, [place]       Patient will benefit from skilled therapeutic intervention in order to improve the following deficits and impairments:  Abnormal gait, Decreased activity tolerance, Decreased balance, Decreased mobility, Decreased knowledge of use of DME, Decreased endurance, Decreased coordination, Decreased range of motion, Decreased strength, Impaired flexibility, Impaired UE functional use,  Postural dysfunction, Obesity, Pain(Pain was not an issue on evaluation, however she reports recent back and LLE pain. PT will not directly address pain as part of the POC, however will monitor for pain during sessions. )  Visit Diagnosis: Muscle weakness (generalized)  Difficulty in walking, not elsewhere classified     Problem List Patient Active Problem List   Diagnosis Date Noted  . Recurrent genital herpes 04/13/2017  . Chronic pain disorder 01/25/2017  . Malignant neoplasm of upper-outer quadrant of left breast in female, estrogen receptor positive ([place]) 12/16/2016  . Chronic knee pain 12/07/2016  . Chronic bilateral low back pain without sciatica  12/07/2016  . Peripheral musculoskeletal gait disorder 12/07/2016  . Encounter for therapeutic drug monitoring 06/16/2016  . Generalized osteoarthritis of multiple sites 05/04/2016  . Chronic anticoagulation 05/04/2016  . Stroke ([place]) 04/14/2016  . Aortic valve disease 04/10/2016  . S/P AVR 04/10/2016  . NICM (nonischemic cardiomyopathy) ([place]) 04/10/2016  . Upper airway cough syndrome 03/14/2016  . Morbid obesity due to excess calories ([place]) 03/14/2016  . COPD GOLD 0  03/11/2016  . Thrombocytopenia ([place]) 12/12/2015  . Anemia 12/12/2015  . Vitamin D deficiency 12/12/2015  . Acute urinary retention 12/11/2015  . Osteoporosis 12/11/2015  . Hip fracture ([place]) 12/10/2015  . Aortic atherosclerosis ([place]) 12/10/2015  . Lung blebs ([place]) 12/03/2015  . Pelvic fracture ([place]) 08/19/2015  . Fall at home 08/19/2015  . Essential hypertension 08/19/2015  . Hyperlipidemia 08/19/2015  . Asthma 08/19/2015  . Atrial fibrillation ([place]) 08/19/2015  . Hyperthyroidism 08/19/2015  . Chronic combined systolic and diastolic CHF (congestive heart failure) ([place])   . 3-vessel CAD   . Aortic stenosis     Rexanne Mano, PT 08/08/2017, 4:05 PM  [facility] [address] [place], [place], [address] Phone: [phone]   Fax:  [phone]  Name: Danielle [name] MRN: [id number] Date of Birth: 1945-12-15

## 2017-08-10 ENCOUNTER — Ambulatory Visit: Payer: Medicare Other

## 2017-08-12 IMAGING — RF DG C-ARM 61-120 MIN
1 series · 4 of 4 positions shown · non-contrast
Comparison: None

FLUOROSCOPY TIME:  1 minutes 57 seconds

CLINICAL DATA: ORIF left femur fracture

EXAM:
DG C-ARM 61-120 MIN; LEFT FEMUR 2 VIEWS

[Series 1: run · 4 of 4 slices shown]
[im 1/4]
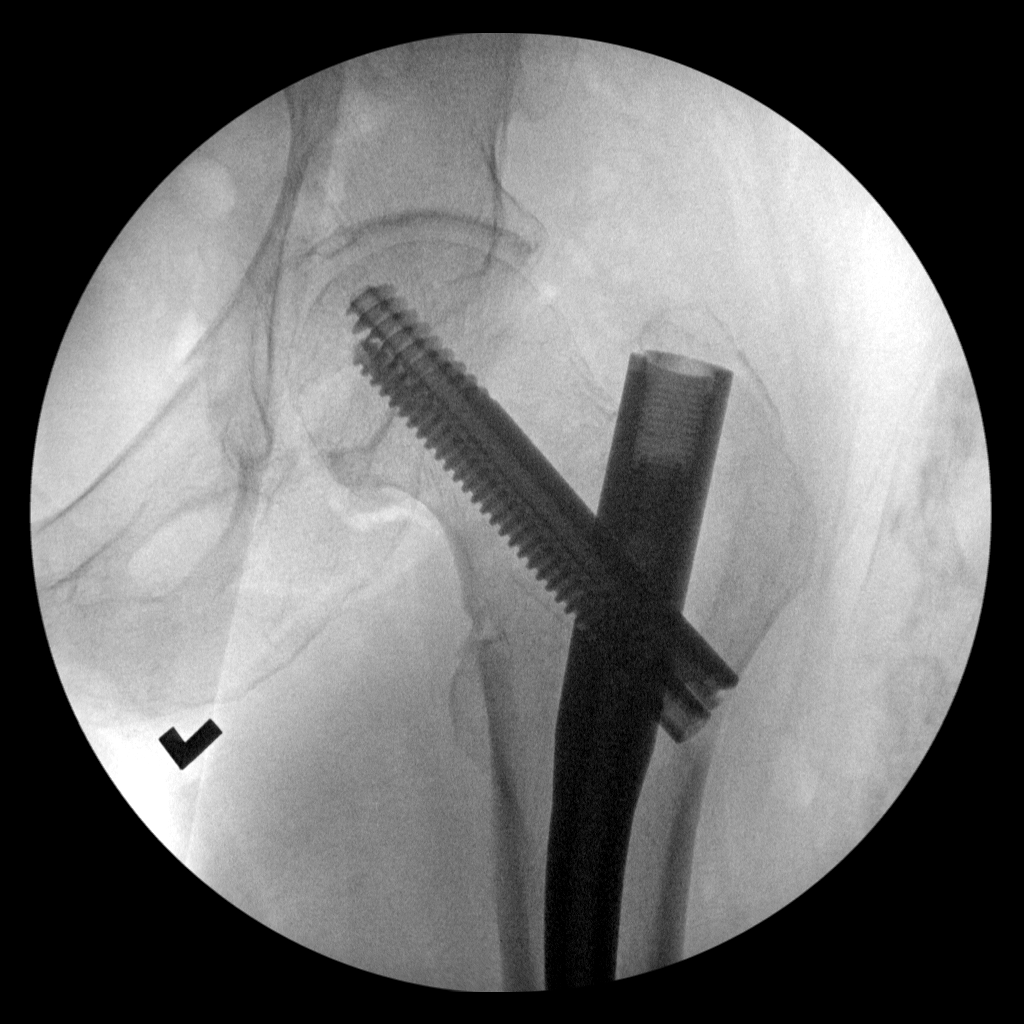
[im 2/4]
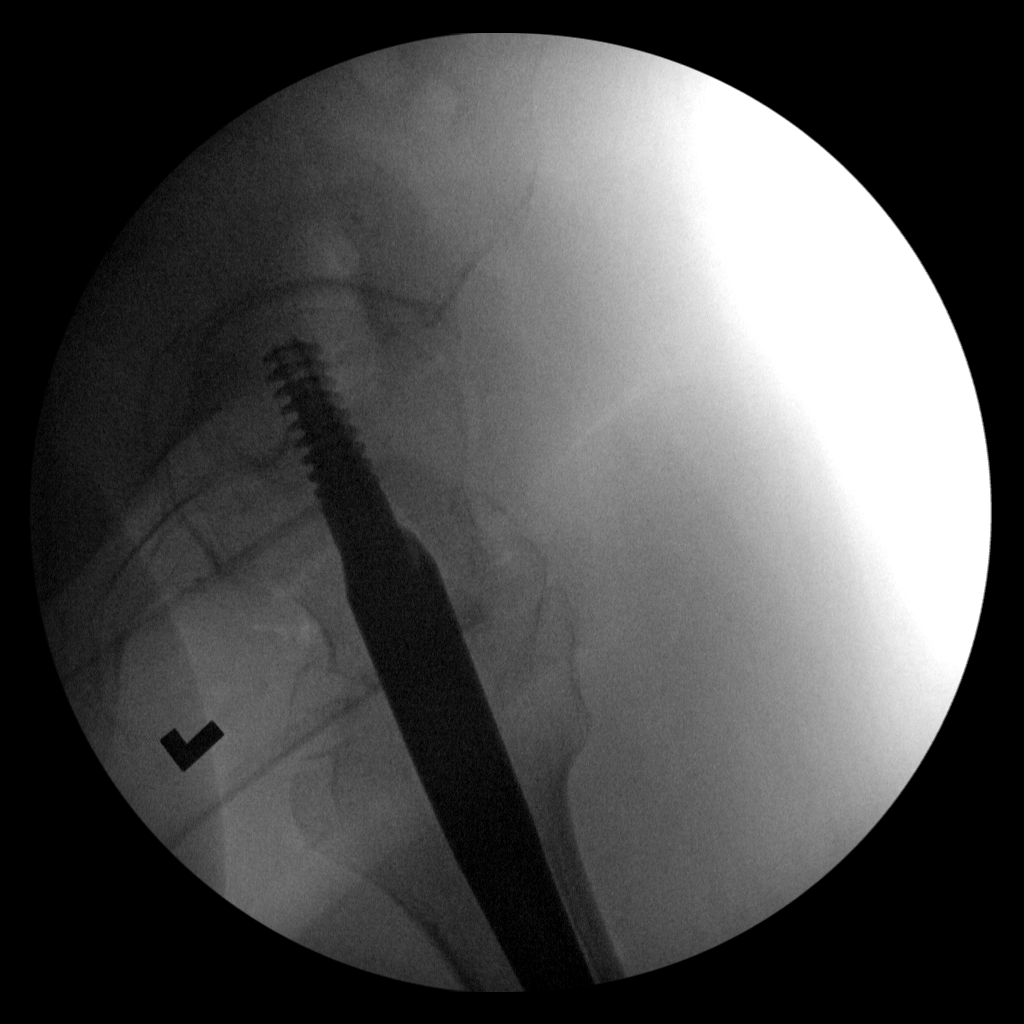
[im 3/4]
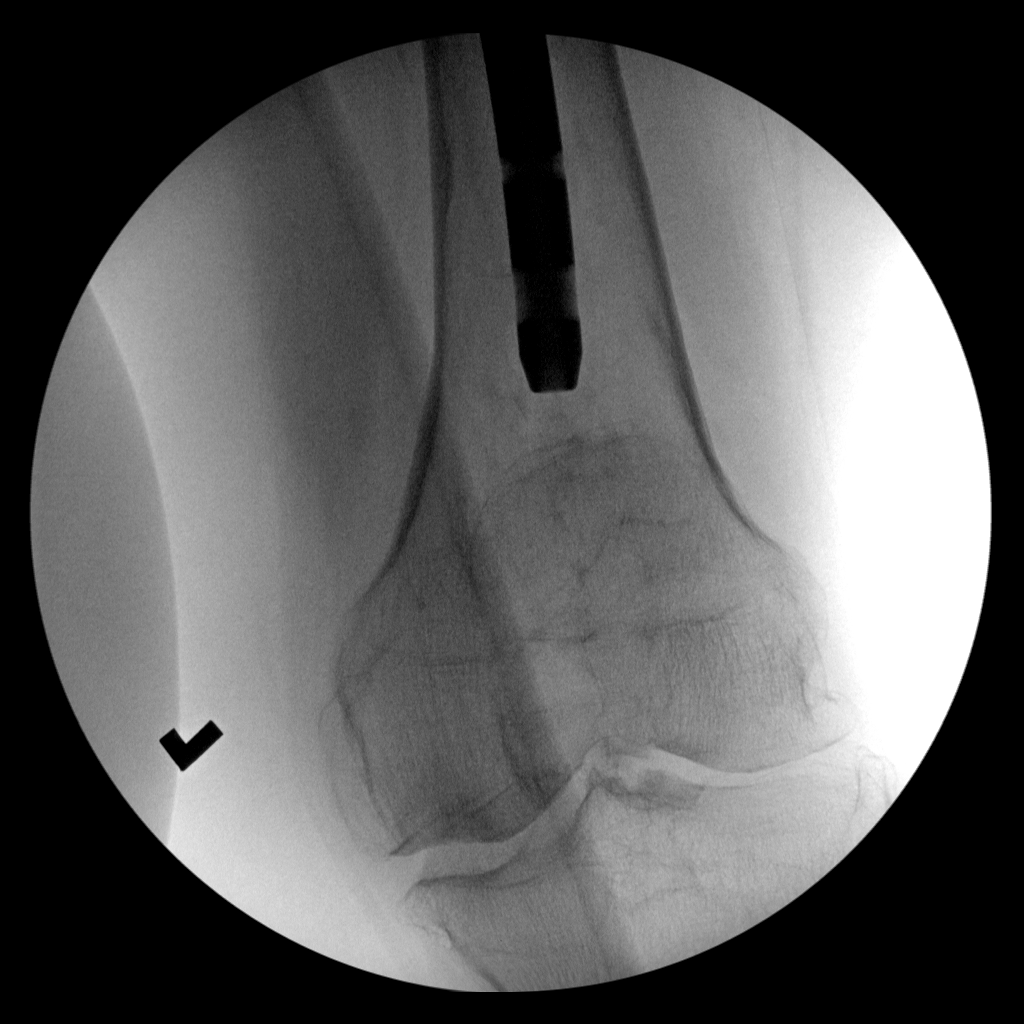
[im 4/4]
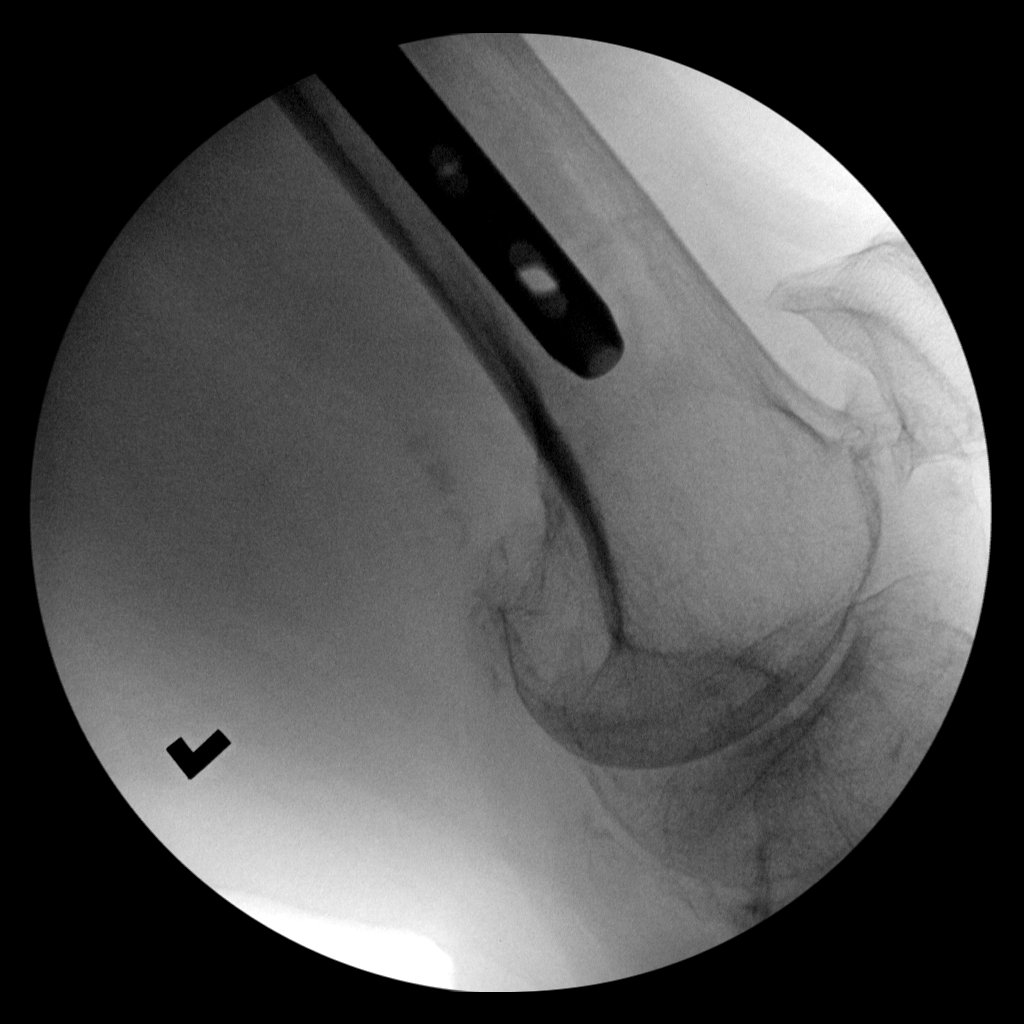

[4 of 4 positions shown; findings below may reference images not displayed]

FINDINGS: Intertrochanteric fracture transfixed with an intramedullary nail
and 2 cannulated femoral neck screws. Fracture is in near anatomic
alignment.
IMPRESSION: ORIF left intertrochanteric fracture.

## 2017-08-12 IMAGING — DX DG CHEST 1V
1 series · 1 of 1 positions shown · non-contrast
Comparison: December 09, 2015

CLINICAL DATA: Pain following fall

EXAM:
CHEST 1 VIEW

[t chest supine]
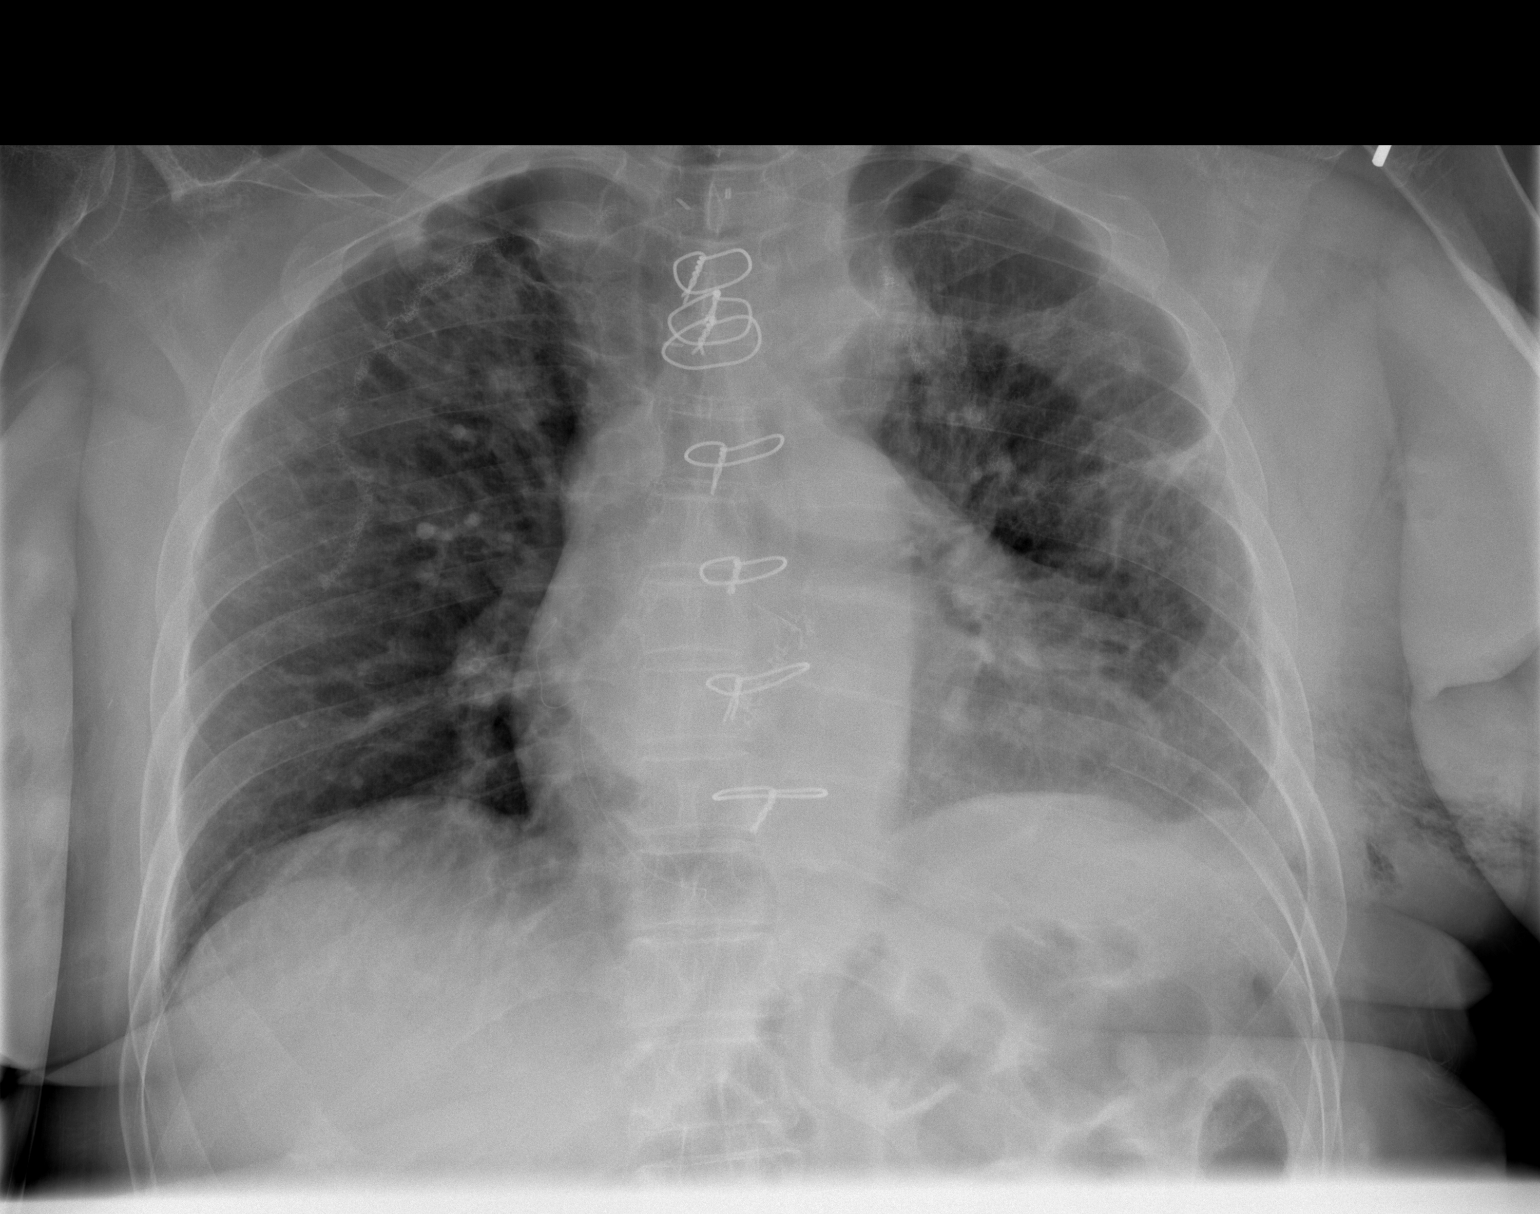

[1 of 1 positions shown; findings below may reference images not displayed]

FINDINGS: There has been resolution of fluid from the recent apical
hydropneumothorax on the left. Small residual pneumothorax remains
in the left apex. There is postoperative change in both upper lobe
regions. Scattered areas of scarring remain. There is a small left
pleural effusion, persistent. Interstitium remains prominent but
stable. There is no frank edema or consolidation. There is
cardiomegaly with mild pulmonary venous hypertension, stable.
Subcutaneous emphysema remains on the left. Central catheter is no
longer apparent.
IMPRESSION: Central catheter no longer apparent. The previously noted fluid in a
left apical hydro pneumothorax has resolved. Small residual left
apical pneumothorax is felt to be present. No tension component.
Small left pleural effusion remains. There is pulmonary venous
congestion without frank edema or consolidation. Somewhat nodular
interstitial prominence remains. Stable cardiac silhouette. Stable
postoperative change in both upper lobes.

## 2017-08-15 ENCOUNTER — Encounter: Payer: Self-pay | Admitting: Physical Therapy

## 2017-08-15 ENCOUNTER — Ambulatory Visit: Payer: Medicare Other | Admitting: Physical Therapy

## 2017-08-15 DIAGNOSIS — R293 Abnormal posture: Secondary | ICD-10-CM | POA: Diagnosis not present

## 2017-08-15 DIAGNOSIS — R262 Difficulty in walking, not elsewhere classified: Secondary | ICD-10-CM

## 2017-08-15 DIAGNOSIS — M6281 Muscle weakness (generalized): Secondary | ICD-10-CM | POA: Diagnosis not present

## 2017-08-16 NOTE — Therapy (Signed)
[facility] [address] [place], [place], [address] Phone: [phone]   Fax:  [phone]  Physical Therapy Treatment  Patient Details  Name: Danielle [name] MRN: [id number] Date of Birth: 04-30-1945 Referring Provider: [name], MD   Encounter Date: 08/15/2017  PT End of Session - 08/15/17 1408    Visit Number  10    Number of Visits  13    Date for PT Re-Evaluation  08/26/17    Authorization Type  [employer] MCR    Authorization Time Period  05/06/17 to 07/05/17; 07/18/17 to 10/16/17    PT Start Time  1405    PT Stop Time  1445    PT Time Calculation (min)  40 min    Equipment Utilized During Treatment  Gait belt    Activity Tolerance  Patient tolerated treatment well    Behavior During Therapy  [facility] for tasks assessed/performed       Past Medical History:  Diagnosis Date  . Allergy   . Anxiety   . Aortic stenosis    Status post bioprosthetic AVR  . Arthritis    DJD  . Asthma   . Breast cancer ([place]) 12/13/2016   Left breast  . Chronic systolic CHF (congestive heart failure) ([place])    EF 30% 04/2014  . COPD (chronic obstructive pulmonary disease) ([place])   . Coronary artery disease    per pt, had LHC prior to AVR in [place] that did not show any blockages; no stents/bypass  . Gastritis   . Hyperlipidemia   . Hypertension   . Hyperthyroidism   . Multiple thyroid nodules   . Osteoporosis   . Paroxysmal atrial fibrillation (HCC)   . Pelvis fracture ([place]) 08/19/2015   MULTIPLE   . Spontaneous pneumothorax 11/28/2015   left   . Stroke [facility]) 2000   rt hand weak    Past Surgical History:  Procedure Laterality Date  . BREAST LUMPECTOMY WITH RADIOACTIVE SEED AND SENTINEL LYMPH NODE BIOPSY Left 01/14/2017   Procedure: LEFT BREAST LUMPECTOMY WITH RADIOACTIVE SEED AND SENTINEL LYMPH NODE BIOPSY;  Surgeon: Rolm Bookbinder, MD;  Location: [facility];  Service: General;  Laterality: Left;  .  CHEST TUBE INSERTION  10/2015  . INTRAMEDULLARY (IM) NAIL INTERTROCHANTERIC Left 12/10/2015   Procedure: INTRAMEDULLARY (IM) NAIL INTERTROCHANTRIC;  Surgeon: Leandrew Koyanagi, MD;  Location: [facility];  Service: Orthopedics;  Laterality: Left;  . PLEURADESIS Left 12/03/2015   Procedure: PLEURADESIS;  Surgeon: Melrose Nakayama, MD;  Location: [facility];  Service: Thoracic;  Laterality: Left;  . RESECTION OF APICAL BLEB Left 12/03/2015   Procedure: BLEBECTOMY;  Surgeon: Melrose Nakayama, MD;  Location: [facility];  Service: Thoracic;  Laterality: Left;  . THORACOSCOPY  12/03/2015  . VALVE REPLACEMENT    . VIDEO ASSISTED THORACOSCOPY Left 12/03/2015   Procedure: VIDEO ASSISTED THORACOSCOPY;  Surgeon: Melrose Nakayama, MD;  Location: [facility];  Service: Thoracic;  Laterality: Left;    There were no vitals filed for this visit.  Subjective Assessment - 08/15/17 1406    Subjective  No new complaints. No falls. Some left knee pain today.     Pertinent History  Arthritis, Left Breast Ca, CHF, COPD, HTN, osteoporosis, PAF, CVA (rt hand weak), 11/2015 fall with hip fracture     Limitations  Standing    How long can you stand comfortably?  "not long" that's what will realy make my back and down my left leg hurt  Patient Stated Goals  walk with cane; ultimately walk with no cane    Currently in Pain?  Yes    Pain Score  5     Pain Location  Knee    Pain Orientation  Left    Pain Descriptors / Indicators  Aching    Pain Type  Chronic pain    Pain Onset  More than a month ago    Pain Frequency  Intermittent    Aggravating Factors   increased standing    Pain Relieving Factors  sitting, medication           [facility] Adult PT Treatment/Exercise - 08/15/17 1410      Transfers   Transfers  Sit to Stand;Stand to Sit    Number of Reps  10 reps;Other sets (comment). 1st set from mat table with no UE support (hands on knees). 2cd set with red band around knees, goal to keep band pulled tight with each rep x  10 reps.      Ambulation/Gait   Ambulation/Gait  Yes    Ambulation/Gait Assistance  4: Min guard;4: Min assist    Ambulation distance 20 x2   Assistive device  Rolling walker;Straight cane cane with rubber quad tip    Gait Pattern  Step-through pattern;Decreased step length - right;Trunk flexed;Decreased dorsiflexion - right;Decreased dorsiflexion - left;Right foot flat;Left foot flat;Trendelenburg;Poor foot clearance - left;Poor foot clearance - right    Ambulation Surface  Level;Indoor      Knee/Hip Exercises: Standing   Heel Raises  Both;1 set;10 reps;5 seconds;Limitations    Heel Raises Limitations  cues for light UE support and hold time (pt counting too fast)    Hip Abduction  AROM;Stengthening;Both;1 set;10 reps;Knee straight;Limitations    Abduction Limitations  with 2# ankle weights on bil LE's with light UE support          Balance Exercises - 08/15/17 1410      Balance Exercises: Standing   Rockerboard  Anterior/posterior;Lateral;Head turns;EO;EC;30 seconds;10 reps;Intermittent UE support    Sidestepping  Upper extremity support;2 reps;Limitations      Balance Exercises: Standing   Rebounder Limitations  performed both ways on balance board: rocking the board with emphasis on tall posture with EO. Then holding the board steady- EC no head movements, progressing to EC head movements left<>right, up<>down. Light support on parallel bars with min guard to min assist for balance. Cues on posture and weight shifting to assist with balance recovery.     Sidestepping Limitations  in parallel bars with UE support on bar- cues on posture, to keep toes/hips facing forward and on step length/height.          PT Short Term Goals - 06/08/17 1937      PT SHORT TERM GOAL #1   Title  Patient will be independent with her HEP addressing bil LE weakness and balance deficits. (Target for all STGs 06/05/17)    Time  4    Period  Weeks    Status  Achieved      PT SHORT TERM GOAL #2    Title  Patient will ambulate 115 ft with cane and minguard assist.     Baseline  06/07/17 Able to walk maximum of 65 ft with cane with minguard assist. Limited by dyspnea and back pain/fatigue    Time  4    Period  Weeks    Status  Partially Met      PT SHORT TERM GOAL #3   Title  Patient will improve 5x sit to stand to <=22 seconds to demonstrate improved LE strength and basic balance.     Baseline  26.7 sec; 06/07/17 15.03 sec     Time  4    Period  Weeks    Status  Achieved      PT SHORT TERM GOAL #4   Title  Patient will improve gait velocity to 1.81 ft/sec or more with LRAD to demonstrate decreasing fall risk and progress towards norm for her age (36.79 ft/sec)    Baseline  1.48 ft/sec; 06/07/17 with RW 1.78 ft/sec; with SPC 0.48 ft/sec    Time  4    Period  Weeks    Status  Partially Met        PT Long Term Goals - 07/21/17 0735      PT LONG TERM GOAL #1   Title  Patient will verbalize a plan for transition to community-based exercise program upon discharge from PT. (Target for all LTGs 07/05/17) (Target for ongoing & new LTGs 08/26/17)    Baseline  07/18/17: Is familiar to [facility]. has not checked into to date    Time  4    Period  Weeks    Status  On-going    Target Date  08/26/17 intended 4 week goals; pt unable to schedule first week due to appt availability, therefore date extended      PT LONG TERM GOAL #2   Title  Patient will ambulate 80 ft with cane and supervision on level indoor terrain.    Baseline  07/18/17: max 80 feet today with varied assistance min guard increasing to mod assist as pt fatigued    Time  4    Period  Weeks    Status  Revised      PT LONG TERM GOAL #3   Title  Pateint will improve 5x sit to stand to <=13.0 seconds to demonstrate decreasing fall risk and improving LE strength (norm for age=13.0 sec)    Baseline  07/18/17: 15.68 sec's with UE support using standard height chair. Dyspnea 2/4 afterwards. (was 15.08 sec's at last assessment)     Time  4    Period  Weeks    Status  On-going      PT LONG TERM GOAL #4   Title  Patient will improve gait velocity to >=2.62 ft/sec with RW and >= 1.5 ft/sec with [facility] demonstrating improved safety for limited community ambuation and closer to norm for her age of 2.79 ft/sec    Baseline  07/18/17: 2.40 ft/sec with RW, 1.38 ft/sec with cane    Time  4    Period  Weeks    Status  Revised            08/15/17 1409  Plan  Clinical Impression Statement Today's skilled session continued to focus on short distance gait with straight cane (with rubber quad tip), balance and strengtheing. Pt continues to fatigue quickly, needing rest breaks through out session. No increased pain reported with activities today. Pt continues to slolwy progress toward unmet goals.                       Pt will benefit from skilled therapeutic intervention in order to improve on the following deficits Abnormal gait;Decreased activity tolerance;Decreased balance;Decreased mobility;Decreased knowledge of use of DME;Decreased endurance;Decreased coordination;Decreased range of motion;Decreased strength;Impaired flexibility;Impaired UE functional use;Postural dysfunction;Obesity;Pain (Pain was not an issue on evaluation, however she reports recent back  and LLE pain. PT will not directly address pain as part of the POC, however will monitor for pain during sessions. )  Rehab Potential Good  Clinical Impairments Affecting Rehab Potential Multiple comorbidities  PT Frequency 1x / week  PT Duration 4 weeks (recertify for 4 weeks 4/71)  PT Treatment/Interventions ADLs/Self Care Home Management;Aquatic Therapy;DME Instruction;Gait training;Balance training;Therapeutic exercise;Therapeutic activities;Functional mobility training;Stair training;Patient/family education;Passive range of motion;Manual techniques  PT Next Visit Plan LTGs due 08/26/17   PT Home Exercise Plan Walking program, STS, cubii 2x/day 30 min each,  side-stepping, heel/toe raises, bridges, sidelying clam  Consulted and Agree with Plan of Care Patient  Family Member Consulted Husband, [place]       Patient will benefit from skilled therapeutic intervention in order to improve the following deficits and impairments:  Abnormal gait, Decreased activity tolerance, Decreased balance, Decreased mobility, Decreased knowledge of use of DME, Decreased endurance, Decreased coordination, Decreased range of motion, Decreased strength, Impaired flexibility, Impaired UE functional use, Postural dysfunction, Obesity, Pain(Pain was not an issue on evaluation, however she reports recent back and LLE pain. PT will not directly address pain as part of the POC, however will monitor for pain during sessions. )  Visit Diagnosis: Muscle weakness (generalized)  Difficulty in walking, not elsewhere classified  Abnormal posture     Problem List Patient Active Problem List   Diagnosis Date Noted  . Recurrent genital herpes 04/13/2017  . Chronic pain disorder 01/25/2017  . Malignant neoplasm of upper-outer quadrant of left breast in female, estrogen receptor positive ([place]) 12/16/2016  . Chronic knee pain 12/07/2016  . Chronic bilateral low back pain without sciatica 12/07/2016  . Peripheral musculoskeletal gait disorder 12/07/2016  . Encounter for therapeutic drug monitoring 06/16/2016  . Generalized osteoarthritis of multiple sites 05/04/2016  . Chronic anticoagulation 05/04/2016  . Stroke ([place]) 04/14/2016  . Aortic valve disease 04/10/2016  . S/P AVR 04/10/2016  . NICM (nonischemic cardiomyopathy) ([place]) 04/10/2016  . Upper airway cough syndrome 03/14/2016  . Morbid obesity due to excess calories ([place]) 03/14/2016  . COPD GOLD 0  03/11/2016  . Thrombocytopenia ([place]) 12/12/2015  . Anemia 12/12/2015  . Vitamin D deficiency 12/12/2015  . Acute urinary retention 12/11/2015  . Osteoporosis 12/11/2015  . Hip fracture ([place]) 12/10/2015  . Aortic  atherosclerosis ([place]) 12/10/2015  . Lung blebs ([place]) 12/03/2015  . Pelvic fracture ([place]) 08/19/2015  . Fall at home 08/19/2015  . Essential hypertension 08/19/2015  . Hyperlipidemia 08/19/2015  . Asthma 08/19/2015  . Atrial fibrillation ([place]) 08/19/2015  . Hyperthyroidism 08/19/2015  . Chronic combined systolic and diastolic CHF (congestive heart failure) ([place])   . 3-vessel CAD   . Aortic stenosis     Willow Ora, PTA, [facility] [address] [place], [place] [address] [phone] 08/17/17, 8:43 AM   Name: Bebe Moncure MRN: [id number] Date of Birth: [birth year]

## 2017-08-16 NOTE — Progress Notes (Signed)
I have reviewed documentation from this visit and I agree with recommendations given.  Varina Hulon G. Makinna Andy, MD  [facility]. [place] office.   

## 2017-08-19 ENCOUNTER — Telehealth: Payer: Self-pay | Admitting: Family Medicine

## 2017-08-19 NOTE — Telephone Encounter (Signed)
Message sent to Dr. [name] for review and approval. 

## 2017-08-19 NOTE — Telephone Encounter (Signed)
Copied from [place] [phone]. Topic: General - Other >> Aug 18, 2017  8:00 AM Yvette Rack wrote: Reason for CRM: pt states that the atorvastatin (LIPITOR) 80 MG tablet has been discontinued and would like a new RX  [place]: 08/19/2017 9:35am  Renew atorvastatin. Send to [facility]. Patient did contact pharmacy.

## 2017-08-19 NOTE — Telephone Encounter (Signed)
[facility] staff who states the prescription of Atorvastatin is not covered by the the pt's insurance. Pharmacy staff also states that they are unable to get the [facility] #s that would be covered by the pt's insurance in the pharmacy.

## 2017-08-22 ENCOUNTER — Encounter: Payer: Self-pay | Admitting: Physical Therapy

## 2017-08-22 ENCOUNTER — Ambulatory Visit: Payer: Medicare Other | Admitting: Physical Therapy

## 2017-08-22 DIAGNOSIS — M6281 Muscle weakness (generalized): Secondary | ICD-10-CM

## 2017-08-22 DIAGNOSIS — R293 Abnormal posture: Secondary | ICD-10-CM | POA: Diagnosis not present

## 2017-08-22 DIAGNOSIS — R262 Difficulty in walking, not elsewhere classified: Secondary | ICD-10-CM

## 2017-08-22 NOTE — Therapy (Signed)
[place] [address] [place], [place], [address] Phone: [phone]   Fax:  [phone]  Physical Therapy Treatment and Discharge  Patient Details  Name: Danielle [name] MRN: [id number] Date of Birth: 1945-11-16 Referring Provider: [name], MD   Encounter Date: 08/22/2017  PT End of Session - 08/22/17 2235    Visit Number  11    Number of Visits  13    Date for PT Re-Evaluation  08/26/17    Authorization Type  [employer] MCR    Authorization Time Period  05/06/17 to 07/05/17; 07/18/17 to 10/16/17    PT Start Time  1404    PT Stop Time  1447    PT Time Calculation (min)  43 min    Activity Tolerance  Patient tolerated treatment well    Behavior During Therapy  [facility] for tasks assessed/performed       Past Medical History:  Diagnosis Date  . Allergy   . Anxiety   . Aortic stenosis    Status post bioprosthetic AVR  . Arthritis    DJD  . Asthma   . Breast cancer ([place]) 12/13/2016   Left breast  . Chronic systolic CHF (congestive heart failure) ([place])    EF 30% 04/2014  . COPD (chronic obstructive pulmonary disease) ([place])   . Coronary artery disease    per pt, had LHC prior to AVR in [place] that did not show any blockages; no stents/bypass  . Gastritis   . Hyperlipidemia   . Hypertension   . Hyperthyroidism   . Multiple thyroid nodules   . Osteoporosis   . Paroxysmal atrial fibrillation (HCC)   . Pelvis fracture ([place]) 08/19/2015   MULTIPLE   . Spontaneous pneumothorax 11/28/2015   left   . Stroke [facility]) 2000   rt hand weak    Past Surgical History:  Procedure Laterality Date  . BREAST LUMPECTOMY WITH RADIOACTIVE SEED AND SENTINEL LYMPH NODE BIOPSY Left 01/14/2017   Procedure: LEFT BREAST LUMPECTOMY WITH RADIOACTIVE SEED AND SENTINEL LYMPH NODE BIOPSY;  Surgeon: Rolm Bookbinder, MD;  Location: [place];  Service: General;  Laterality: Left;  . CHEST TUBE INSERTION  10/2015  .  INTRAMEDULLARY (IM) NAIL INTERTROCHANTERIC Left 12/10/2015   Procedure: INTRAMEDULLARY (IM) NAIL INTERTROCHANTRIC;  Surgeon: Leandrew Koyanagi, MD;  Location: [place];  Service: Orthopedics;  Laterality: Left;  . PLEURADESIS Left 12/03/2015   Procedure: PLEURADESIS;  Surgeon: Melrose Nakayama, MD;  Location: [place];  Service: Thoracic;  Laterality: Left;  . RESECTION OF APICAL BLEB Left 12/03/2015   Procedure: BLEBECTOMY;  Surgeon: Melrose Nakayama, MD;  Location: [place];  Service: Thoracic;  Laterality: Left;  . THORACOSCOPY  12/03/2015  . VALVE REPLACEMENT    . VIDEO ASSISTED THORACOSCOPY Left 12/03/2015   Procedure: VIDEO ASSISTED THORACOSCOPY;  Surgeon: Melrose Nakayama, MD;  Location: [place];  Service: Thoracic;  Laterality: Left;    There were no vitals filed for this visit.  Subjective Assessment - 08/22/17 2227    Subjective  No new complaints. No falls. Reports she has been working hard to improve her walking with her goal remaining to walk with no device.     Pertinent History  Arthritis, Left Breast Ca, CHF, COPD, HTN, osteoporosis, PAF, CVA (rt hand weak), 11/2015 fall with hip fracture     Limitations  Standing    How long can you stand comfortably?  "not long" that's what will realy make my back and down my  left leg hurt    Patient Stated Goals  walk with cane; ultimately walk with no cane    Currently in Pain?  No/denies    Pain Onset  More than a month ago         [facility] PT Assessment - 08/22/17 1412      Ambulation/Gait   Gait velocity  32.8/35.88=0.9 ft/sec                   [facility] Adult PT Treatment/Exercise - 08/22/17 1412      Transfers   Transfers  Sit to Stand;Stand to Sit    Sit to Stand  6: Modified independent (Device/Increase time)    Five time sit to stand comments   17.56  with use of armrests      Ambulation/Gait   Ambulation/Gait Assistance  5: Supervision    Ambulation/Gait Assistance Details  with SPC and quad rubber tip vs RW with  cues for very light UE support    Ambulation Distance (Feet)  150 Feet 80, 60, 60    Assistive device  Rolling walker;Straight cane    Gait Pattern  Step-through pattern;Decreased step length - right;Trunk flexed;Decreased dorsiflexion - right;Decreased dorsiflexion - left;Right foot flat;Left foot flat;Trendelenburg;Poor foot clearance - left;Poor foot clearance - right    Ambulation Surface  Level;Indoor      Knee/Hip Exercises: Machines for Strengthening   Cybex Leg Press  100# bil LEs 20 reps, 25 reps 27 reps               PT Short Term Goals - 06/08/17 1937      PT SHORT TERM GOAL #1   Title  Patient will be independent with her HEP addressing bil LE weakness and balance deficits. (Target for all STGs 06/05/17)    Time  4    Period  Weeks    Status  Achieved      PT SHORT TERM GOAL #2   Title  Patient will ambulate 115 ft with cane and minguard assist.     Baseline  06/07/17 Able to walk maximum of 65 ft with cane with minguard assist. Limited by dyspnea and back pain/fatigue    Time  4    Period  Weeks    Status  Partially Met      PT SHORT TERM GOAL #3   Title  Patient will improve 5x sit to stand to <=22 seconds to demonstrate improved LE strength and basic balance.     Baseline  26.7 sec; 06/07/17 15.03 sec     Time  4    Period  Weeks    Status  Achieved      PT SHORT TERM GOAL #4   Title  Patient will improve gait velocity to 1.81 ft/sec or more with LRAD to demonstrate decreasing fall risk and progress towards norm for her age (72.79 ft/sec)    Baseline  1.48 ft/sec; 06/07/17 with RW 1.78 ft/sec; with SPC 0.48 ft/sec    Time  4    Period  Weeks    Status  Partially Met        PT Long Term Goals - 08/22/17 2236      PT LONG TERM GOAL #1   Title  Patient will verbalize a plan for transition to community-based exercise program upon discharge from PT. (Target for all LTGs 07/05/17) (Target for ongoing & new LTGs 08/26/17)    Baseline  07/18/17: Is familiar to [facility]. has not  checked into to date; 08/22/17 after family tragedy (husband's son murdered) she has not made it into [facility]; she still wants to     Time  4    Period  Weeks    Status  Partially Met      PT LONG TERM GOAL #2   Title  Patient will ambulate 80 ft with cane and supervision on level indoor terrain.    Baseline  07/18/17: max 80 feet today with varied assistance min guard increasing to mod assist as pt fatigued; 6/24 150 ft cane supervision    Time  4    Period  Weeks    Status  Achieved      PT LONG TERM GOAL #3   Title  Pateint will improve 5x sit to stand to <=13.0 seconds to demonstrate decreasing fall risk and improving LE strength (norm for age=13.0 sec)    Baseline  07/18/17: 15.68 sec's with UE support using standard height chair. Dyspnea 2/4 afterwards. (was 15.08 sec's at last assessment); 6/24 17.56 sec    Time  4    Period  Weeks    Status  Not Met      PT LONG TERM GOAL #4   Title  Patient will improve gait velocity to >=2.62 ft/sec with RW and >= 1.5 ft/sec with [facility] demonstrating improved safety for limited community ambuation and closer to norm for her age of 2.79 ft/sec    Baseline  07/18/17: 2.40 ft/sec with RW, 1.38 ft/sec with cane; 08/22/17 1.66 ft/sec with RW; 09. ft/sec    Time  4    Period  Weeks    Status  Not Met            Plan - 08/22/17 2242    Clinical Impression Statement  LTGs assessed wtih pt meeting one goal, partially meeting one goal, and final 2 goals unmet. Patient's plan of care was interrupted by the murder of her husband's son, which required her to miss several sessions. Overall her walking posture is more upright and she is placing less pressure through her UEs. Patient remains motivated to reach her goals and can verbalize her plan for continued activities after discharge from PT.     Rehab Potential  Good    Clinical Impairments Affecting Rehab Potential  Multiple comorbidities    PT Frequency  1x / week     PT Duration  4 weeks recertify for 4 weeks 0/27    PT Treatment/Interventions  ADLs/Self Care Home Management;Aquatic Therapy;DME Instruction;Gait training;Balance training;Therapeutic exercise;Therapeutic activities;Functional mobility training;Stair training;Patient/family education;Passive range of motion;Manual techniques    PT Home Exercise Plan  Walking program, STS, cubii 2x/day 30 min each, side-stepping, heel/toe raises, bridges, sidelying clam    Consulted and Agree with Plan of Care  Patient    Family Member Consulted  Husband, Sherwood       Patient will benefit from skilled therapeutic intervention in order to improve the following deficits and impairments:  Abnormal gait, Decreased activity tolerance, Decreased balance, Decreased mobility, Decreased knowledge of use of DME, Decreased endurance, Decreased coordination, Decreased range of motion, Decreased strength, Impaired flexibility, Impaired UE functional use, Postural dysfunction, Obesity, Pain(Pain was not an issue on evaluation, however she reports recent back and LLE pain. PT will not directly address pain as part of the POC, however will monitor for pain during sessions. )  Visit Diagnosis: Muscle weakness (generalized)  Difficulty in walking, not elsewhere classified     Problem List Patient Active  Problem List   Diagnosis Date Noted  . Recurrent genital herpes 04/13/2017  . Chronic pain disorder 01/25/2017  . Malignant neoplasm of upper-outer quadrant of left breast in female, estrogen receptor positive ([place]) 12/16/2016  . Chronic knee pain 12/07/2016  . Chronic bilateral low back pain without sciatica 12/07/2016  . Peripheral musculoskeletal gait disorder 12/07/2016  . Encounter for therapeutic drug monitoring 06/16/2016  . Generalized osteoarthritis of multiple sites 05/04/2016  . Chronic anticoagulation 05/04/2016  . Stroke ([place]) 04/14/2016  . Aortic valve disease 04/10/2016  . S/P AVR 04/10/2016  . NICM  (nonischemic cardiomyopathy) ([place]) 04/10/2016  . Upper airway cough syndrome 03/14/2016  . Morbid obesity due to excess calories ([place]) 03/14/2016  . COPD GOLD 0  03/11/2016  . Thrombocytopenia ([place]) 12/12/2015  . Anemia 12/12/2015  . Vitamin D deficiency 12/12/2015  . Acute urinary retention 12/11/2015  . Osteoporosis 12/11/2015  . Hip fracture ([place]) 12/10/2015  . Aortic atherosclerosis ([place]) 12/10/2015  . Lung blebs ([place]) 12/03/2015  . Pelvic fracture ([place]) 08/19/2015  . Fall at home 08/19/2015  . Essential hypertension 08/19/2015  . Hyperlipidemia 08/19/2015  . Asthma 08/19/2015  . Atrial fibrillation ([place]) 08/19/2015  . Hyperthyroidism 08/19/2015  . Chronic combined systolic and diastolic CHF (congestive heart failure) ([place])   . 3-vessel CAD   . Aortic stenosis    PHYSICAL THERAPY DISCHARGE SUMMARY  Visits from Start of Care: 11  Current functional level related to goals / functional outcomes: See LTGs above; supervision with SPC over level indoor surface, modified independent with RW   Remaining deficits: bil LE weakness and pain (knees and hips)   Education / Equipment: HEP; safe use of DME  Plan: Patient agrees to discharge.  Patient goals were partially met. Patient is being discharged due to financial reasons.  ?????        Rexanne Mano, PT 08/22/2017, 10:47 PM  [place] [address] [place], [place], [address] Phone: [phone]   Fax:  [phone]  Name: Danielle [name] MRN: [id number] Date of Birth: June 14, 1945

## 2017-08-22 NOTE — Telephone Encounter (Signed)
Crestor 40 mg is an option to take daily.  Thanks, BJ

## 2017-08-23 NOTE — Telephone Encounter (Signed)
Because the max dose of Crestor is 40 mg. BJ

## 2017-08-23 NOTE — Telephone Encounter (Signed)
Pt states that she has never taken Crestor before and is okay with trying this.  Pt is wondering why she is only taking 40mg  though when she was taking 80mg  of the Lipitor.  Aware that I will follow up on the dosing with Dr [name] and we will let her know once the medication is sent to the pharmacy.   Pharmacy: [facility] [address] [place] - [phone]

## 2017-08-25 NOTE — Telephone Encounter (Signed)
See request. Thanks. 

## 2017-08-25 NOTE — Telephone Encounter (Signed)
Patient would like to try the Crestor 40mg  and would like a cal once its sent.

## 2017-08-25 NOTE — Telephone Encounter (Signed)
Pt I calling to f/up.  Pt states pharmacy has not recieves a new rx for either Crestor or atorvastatin (LIPITOR) 80 MG tablet.  Please advise.  Pt CB#: [phone]  Pharmacy:   [facility] [address] [place] - [address] [phone] (Phone) [phone] (Fax)

## 2017-08-26 ENCOUNTER — Other Ambulatory Visit: Payer: Self-pay | Admitting: *Deleted

## 2017-08-26 MED ORDER — ROSUVASTATIN CALCIUM 40 MG PO TABS: 40.0000 mg | ORAL_TABLET | Freq: Every day | ORAL | 3 refills | Status: DC

## 2017-08-26 NOTE — Telephone Encounter (Signed)
Pt calling again as Crestor has still not been sent to the pharmacy.

## 2017-08-26 NOTE — Telephone Encounter (Signed)
Rx for Crestor 40 mg sent to pharmacy. Patient should have been advised that phones were down for 2 days and that Thursdays we are out of the office so that it doesn't look like requests are being ignored. Thanks

## 2017-08-31 ENCOUNTER — Ambulatory Visit: Payer: Medicare Other

## 2017-09-07 ENCOUNTER — Ambulatory Visit (INDEPENDENT_AMBULATORY_CARE_PROVIDER_SITE_OTHER): Payer: Medicare Other | Admitting: General Practice

## 2017-09-07 DIAGNOSIS — Z7901 Long term (current) use of anticoagulants: Secondary | ICD-10-CM

## 2017-09-07 LAB — POCT INR: INR: 3 (ref 2.0–3.0)

## 2017-09-07 NOTE — Patient Instructions (Addendum)
Pre visit review using our clinic review tool, if applicable. No additional management support is needed unless otherwise documented below in the visit note. ° °Continue to take 1 tablet daily except 1 1/2 tablets on Wednesdays. Re-check in 4 weeks. °

## 2017-09-14 ENCOUNTER — Ambulatory Visit: Payer: Medicare Other | Admitting: Family Medicine

## 2017-09-14 NOTE — Progress Notes (Signed)
I have reviewed documentation from this visit and I agree with recommendations given.  [name]. [name], MD  [facility]. [place] office.   

## 2017-09-15 ENCOUNTER — Ambulatory Visit: Payer: Medicare Other | Admitting: Endocrinology

## 2017-09-26 ENCOUNTER — Other Ambulatory Visit: Payer: Self-pay | Admitting: Physical Medicine & Rehabilitation

## 2017-09-26 DIAGNOSIS — M159 Polyosteoarthritis, unspecified: Secondary | ICD-10-CM

## 2017-09-26 DIAGNOSIS — G894 Chronic pain syndrome: Secondary | ICD-10-CM

## 2017-09-27 DIAGNOSIS — N183 Chronic kidney disease, stage 3 unspecified: Secondary | ICD-10-CM | POA: Insufficient documentation

## 2017-09-27 DIAGNOSIS — N1832 Chronic kidney disease, stage 3b: Secondary | ICD-10-CM | POA: Insufficient documentation

## 2017-09-27 NOTE — Progress Notes (Signed)
HPI:   Ms.Rhetta Pulliam-McEachean is a 72 y.o. female, who is here today for follow up.   She was last seen on 04/13/2017. Since then she has been following with oncologist, breast cancer and currently she is on tamoxifen 20 mg daily.    Hyperthyroidism:  Currently she is on methimazole 5 mg 3 tablets daily.  According to patient, Dr. Loanne Drilling asked her to continue following with her PCP.  She states that it is hard for her to follow with endocrinologist due to cost. She states that she has been on the same dose of methimazole for years.  Tolerating medication well, no side effects reported. She has not noted dysphagia, palpitations, abdominal pain, changes in bowel habits, tremor, cold/heat intolerance, or abnormal weight loss.  Lab Results  Component Value Date   TSH 0.89 06/14/2017   Hyperlipidemia: Currently she is on Crestor 40 mg daily. She also follows a low-fat diet. Tolerating medication well, she is not reporting side effects.    Lab Results  Component Value Date   CHOL 233 (H) 04/13/2017   HDL 69.20 04/13/2017   LDLCALC 145 (H) 04/13/2017   TRIG 96.0 04/13/2017   CHOLHDL 3 04/13/2017     Hypertension:   Currently on Coreg 25 mg twice daily and Cozaar 50 mg daily.  She is taking medications as instructed, no side effects reported.  She has not noted unusual headache, visual changes, exertional chest pain, dyspnea,  focal weakness, or edema.   Lab Results  Component Value Date   CREATININE 1.24 (H) 07/04/2017   BUN 19 07/04/2017   NA 138 07/04/2017   K 4.9 07/04/2017   CL 109 07/04/2017   CO2 27 07/04/2017    + CKD III. She has not noted hematuria, form in urine, or decreased urine output.  History of CVA and aortic valvular disease, S/P bioprosthetic aortic valve replacement in 03/2014. CHF with LVEF 30 to 35% in 03/21/2015 and atrial fibrillation. She is on chronic anticoagulation treatment with Coumadin. She is sleeping on 3  pillows, which she has done for years. Denies PND.  She follows with cardiologist,Dr End, next appointment in about a month. She is currently on Aspirin 81 mg daily.  Vitamin D deficiency: She is currently on vitamin D3 1000 units daily.  She is still following with pain management, completed PT, and now she is using a walker instead a wheelchair. Occasionally she has used a cane but she still feels unstable.   Review of Systems  Constitutional: Negative for activity change, appetite change, fatigue and fever.  HENT: Negative for mouth sores, nosebleeds and trouble swallowing.   Eyes: Negative for redness and visual disturbance.  Respiratory: Negative for cough, shortness of breath and wheezing.   Cardiovascular: Negative for chest pain, palpitations and leg swelling.  Gastrointestinal: Negative for abdominal pain, nausea and vomiting.       Negative for changes in bowel habits.  Endocrine: Negative for cold intolerance, heat intolerance, polydipsia, polyphagia and polyuria.  Genitourinary: Negative for decreased urine volume, dysuria and hematuria.  Musculoskeletal: Positive for arthralgias and gait problem.  Skin: Negative for rash and wound.  Neurological: Negative for syncope, weakness and headaches.  Psychiatric/Behavioral: Negative for confusion. The patient is nervous/anxious.       Current Outpatient Medications on File Prior to Visit  Medication Sig Dispense Refill  . acetaminophen (TYLENOL) 500 MG tablet Take 1,000 mg by mouth daily as needed (PAIN).    Marland Kitchen albuterol (PROVENTIL HFA;VENTOLIN HFA)  108 (90 Base) MCG/ACT inhaler Inhale into the lungs.    Marland Kitchen alendronate (FOSAMAX) 70 MG tablet Take 1 tablet (70 mg total) by mouth every Saturday. with a full glass of water on an empty stomach. 13 tablet 3  . aspirin EC 81 MG tablet Take 81 mg daily by mouth.    . budesonide-formoterol (SYMBICORT) 160-4.5 MCG/ACT inhaler Inhale 2 puffs into the lungs 2 (two) times daily. 1 Inhaler  0  . carvedilol (COREG) 25 MG tablet Take 1 tablet (25 mg total) by mouth 2 (two) times daily. 180 tablet 2  . Cholecalciferol (VITAMIN D3) 400 units tablet Take 1 tablet (400 Units total) by mouth daily. 30 tablet 0  . diclofenac sodium (VOLTAREN) 1 % GEL Apply 4 g topically 4 (four) times daily. 500 g 3  . diphenhydramine-acetaminophen (TYLENOL PM) 25-500 MG TABS tablet Take 2 tablets by mouth at bedtime as needed (SLEEP).     . furosemide (LASIX) 20 MG tablet Take 1 tablet (20 mg total) by mouth daily. 90 tablet 2  . gabapentin (NEURONTIN) 100 MG capsule TAKE 1 CAPSULE BY MOUTH THREE TIMES DAILY 90 capsule 2  . losartan (COZAAR) 50 MG tablet Take 1 tablet by mouth daily 90 tablet 3  . ranitidine (ZANTAC) 150 MG tablet Take 1 tablet (150 mg total) by mouth 2 (two) times daily. 180 tablet 3  . rosuvastatin (CRESTOR) 40 MG tablet Take 1 tablet (40 mg total) by mouth daily. 90 tablet 3  . senna-docusate (SENOKOT-S) 8.6-50 MG tablet Take 2 tablets at bedtime by mouth.     . tamoxifen (NOLVADEX) 20 MG tablet Take 1 tablet (20 mg total) by mouth daily. 90 tablet 3  . traMADol (ULTRAM) 50 MG tablet TAKE 1 TABLET BY MOUTH THREE TIMES DAILY 90 tablet 0  . valACYclovir (VALTREX) 500 MG tablet Take 1 tablet (500 mg total) by mouth 2 (two) times daily. For 3 days when outbreaks. 24 tablet 0  . warfarin (COUMADIN) 3 MG tablet TAKE AS DIRECTED BY  ANTICOAGULATION  CLINIC 40 tablet 2  . warfarin (COUMADIN) 3 MG tablet Take as directed by anticoagulation clinic. 40 tablet 2   No current facility-administered medications on file prior to visit.      Past Medical History:  Diagnosis Date  . Allergy   . Anxiety   . Aortic stenosis    Status post bioprosthetic AVR  . Arthritis    DJD  . Asthma   . Breast cancer ([place]) 12/13/2016   Left breast  . Chronic systolic CHF (congestive heart failure) ([place])    EF 30% 04/2014  . COPD (chronic obstructive pulmonary disease) ([place])   . Coronary artery disease     per pt, had LHC prior to AVR in [place] that did not show any blockages; no stents/bypass  . Gastritis   . Hyperlipidemia   . Hypertension   . Hyperthyroidism   . Multiple thyroid nodules   . Osteoporosis   . Paroxysmal atrial fibrillation (HCC)   . Pelvis fracture ([place]) 08/19/2015   MULTIPLE   . Spontaneous pneumothorax 11/28/2015   left   . Stroke [facility]) 2000   rt hand weak   Allergies  Allergen Reactions  . Lisinopril Cough  . Tetanus Toxoid Adsorbed Swelling    Arm swelling    Social History   Socioeconomic History  . Marital status: Married    Spouse name: Sherwood  . Number of children: 0  . Years of education: Not on file  .  Highest education level: Not on file  Occupational History  . Occupation: Retired in 2004  Social Needs  . Financial resource strain: Not on file  . Food insecurity:    Worry: Not on file    Inability: Not on file  . Transportation needs:    Medical: Not on file    Non-medical: Not on file  Tobacco Use  . Smoking status: Former Smoker    Packs/day: 0.25    Years: 10.00    Pack years: 2.50    Types: Cigarettes    Last attempt to quit: 03/01/2010    Years since quitting: 7.5  . Smokeless tobacco: Never Used  Substance and Sexual Activity  . Alcohol use: No  . Drug use: No  . Sexual activity: Not on file  Lifestyle  . Physical activity:    Days per week: Not on file    Minutes per session: Not on file  . Stress: Not on file  Relationships  . Social connections:    Talks on phone: Not on file    Gets together: Not on file    Attends religious service: Not on file    Active member of club or organization: Not on file    Attends meetings of clubs or organizations: Not on file    Relationship status: Not on file  Other Topics Concern  . Not on file  Social History Narrative   Lives with husband.  Ambulated independently.    Vitals:   09/28/17 0700  BP: 130/82  Pulse: 97  Resp: 16  Temp: 98 F (36.7 C)  SpO2: 97%    Body mass index is 37.59 kg/m.   Physical Exam  Nursing note and vitals reviewed. Constitutional: She is oriented to person, place, and time. She appears well-developed. No distress.  HENT:  Head: Normocephalic and atraumatic.  Mouth/Throat: Oropharynx is clear and moist and mucous membranes are normal.  Eyes: Pupils are equal, round, and reactive to light. Conjunctivae are normal.  Neck: No tracheal deviation present. Thyromegaly present.  Cardiovascular: Normal rate and regular rhythm.  Murmur (SEM I/VI RUSB) heard. Pulses:      Dorsalis pedis pulses are 2+ on the right side, and 2+ on the left side.  Respiratory: Effort normal and breath sounds normal. No respiratory distress.  GI: Soft. She exhibits no mass. There is no tenderness.  Musculoskeletal: She exhibits edema (Trace pitting edema LE, bilateral.).  Lymphadenopathy:    She has no cervical adenopathy.  Neurological: She is alert and oriented to person, place, and time. She has normal strength.  Unstable gait, assisted with walker.  Skin: Skin is warm. No erythema.  Psychiatric: She has a normal mood and affect.  Well groomed, good eye contact.     ASSESSMENT AND PLAN:   Ms. Kanyia Heaslip was seen today for follow-up.  Orders Placed This Encounter  Procedures  . VITAMIN D 25 Hydroxy (Vit-D Deficiency, Fractures)  . Lipid panel  . TSH  . Basic metabolic panel  . Microalbumin / creatinine urine ratio  . POC INR    Lab Results  Component Value Date   CHOL 154 09/28/2017   HDL 64.10 09/28/2017   LDLCALC 71 09/28/2017   TRIG 94.0 09/28/2017   CHOLHDL 2 09/28/2017   Lab Results  Component Value Date   TSH 1.96 09/28/2017   Lab Results  Component Value Date   CREATININE 1.17 09/28/2017   BUN 16 09/28/2017   NA 142 09/28/2017   K 5.0 09/28/2017  CL 109 09/28/2017   CO2 29 09/28/2017    CKD (chronic kidney disease), stage III (HCC)  It has been stable. Adequate  hydration. Continue Losartan. Low salt diet.  Adequate BP controlled.  Essential hypertension Adequately controlled. No changes in current management. She will continue low-salt diet. F/U in 4 months, before if needed.   Hyperlipidemia No changes in Crestor 40 mg daily, she will continue low-fat diet. Further recommendation will be given according to lipid panel results. Follow-up in 6 to 12 months.  Hyperthyroidism Because of cost she has not been able to follow with Dr. Loanne Drilling as recommended. Given the fact she is reporting being on the same dose for years and currently asymptomatic, I can continue following problem of feeling medications as far as problem is a stable. No changes in current management, will follow  TSH done today and will give further recommendations accordingly.   Vitamin D deficiency Continue vitamin D3 1000 units daily. Further recommendations will be given according to lab results.  Chronic anticoagulation INR goal between 2-3, today it is subtherapeutic. Coumadin dose increased, today and tomorrow she will take 2 tablets of Coumadin 3 mg, rest of the week she will continue Coumadin 3 mg. INR in a week. We discussed the importance of following dietary recommendations. Instructed about warning signs.       Elmo Rio G. [name], MD  [facility]. [place] office.

## 2017-09-28 ENCOUNTER — Ambulatory Visit (INDEPENDENT_AMBULATORY_CARE_PROVIDER_SITE_OTHER): Payer: Medicare Other | Admitting: Family Medicine

## 2017-09-28 ENCOUNTER — Encounter: Payer: Self-pay | Admitting: Family Medicine

## 2017-09-28 ENCOUNTER — Telehealth: Payer: Self-pay

## 2017-09-28 VITALS — BP 130/82 | HR 97 | Temp 98.0°F | Resp 16 | Ht 67.0 in | Wt 240.0 lb

## 2017-09-28 DIAGNOSIS — E559 Vitamin D deficiency, unspecified: Secondary | ICD-10-CM

## 2017-09-28 DIAGNOSIS — E0590 Thyrotoxicosis, unspecified without thyrotoxic crisis or storm: Secondary | ICD-10-CM

## 2017-09-28 DIAGNOSIS — N183 Chronic kidney disease, stage 3 unspecified: Secondary | ICD-10-CM

## 2017-09-28 DIAGNOSIS — I10 Essential (primary) hypertension: Secondary | ICD-10-CM | POA: Diagnosis not present

## 2017-09-28 DIAGNOSIS — Z7901 Long term (current) use of anticoagulants: Secondary | ICD-10-CM

## 2017-09-28 DIAGNOSIS — E785 Hyperlipidemia, unspecified: Secondary | ICD-10-CM

## 2017-09-28 LAB — TSH: TSH: 1.96 u[IU]/mL (ref 0.35–4.50)

## 2017-09-28 LAB — LIPID PANEL
CHOLESTEROL: 154 mg/dL (ref 0–200)
HDL: 64.1 mg/dL (ref >39.00)
LDL CALC: 71 mg/dL (ref 0–99)
NonHDL: 89.77
TRIGLYCERIDES: 94 mg/dL (ref 0.0–149.0)
Total CHOL/HDL Ratio: 2
VLDL: 18.8 mg/dL (ref 0.0–40.0)

## 2017-09-28 LAB — BASIC METABOLIC PANEL
BUN: 16 mg/dL (ref 6–23)
CHLORIDE: 109 meq/L (ref 96–112)
CO2: 29 meq/L (ref 19–32)
CREATININE: 1.17 mg/dL (ref 0.40–1.20)
Calcium: 9.7 mg/dL (ref 8.4–10.5)
GFR: 58.52 mL/min — ABNORMAL LOW (ref >60.00)
GLUCOSE: 90 mg/dL (ref 70–99)
Potassium: 5 mEq/L (ref 3.5–5.1)
Sodium: 142 mEq/L (ref 135–145)

## 2017-09-28 LAB — VITAMIN D 25 HYDROXY (VIT D DEFICIENCY, FRACTURES): VITD: 29.82 ng/mL — AB (ref 30.00–100.00)

## 2017-09-28 LAB — POCT INR: INR: 1.5 — AB (ref 2.0–3.0)

## 2017-09-28 MED ORDER — METHIMAZOLE 5 MG PO TABS: ORAL_TABLET | ORAL | 1 refills | Status: DC

## 2017-09-28 NOTE — Telephone Encounter (Signed)
Spoke with pt reminding of SCP visit with NP on 10/03/17 at 2 pm.  Pt said she will come to appt.

## 2017-09-28 NOTE — Assessment & Plan Note (Signed)
Because of cost she has not been able to follow with Dr. Loanne Drilling as recommended. Given the fact she is reporting being on the same dose for years and currently asymptomatic, I can continue following problem of feeling medications as far as problem is a stable. No changes in current management, will follow  TSH done today and will give further recommendations accordingly.

## 2017-09-28 NOTE — Patient Instructions (Addendum)
A few things to remember from today's visit:   Vitamin D deficiency  CKD (chronic kidney disease), stage III ([place]) - Plan: Microalbumin / creatinine urine ratio, VITAMIN D 25 Hydroxy (Vit-D Deficiency, Fractures), Basic metabolic panel  Essential hypertension - Plan: Basic metabolic panel  Hyperthyroidism - Plan: TSH, methimazole (TAPAZOLE) 5 MG tablet  Hyperlipidemia, unspecified hyperlipidemia type - Plan: Lipid panel  Chronic anticoagulation - Plan: CANCELED: POC INR  Today take Coumadin 3 mg 2 tablets. Tomorrow take Coumadin 3 mg to 2 tablets.  INR in 1 week.   Please be sure medication list is accurate. If a new problem present, please set up appointment sooner than planned today.

## 2017-09-28 NOTE — Assessment & Plan Note (Signed)
Continue vitamin D3 1000 units daily. Further recommendations will be given according to lab results.

## 2017-09-28 NOTE — Assessment & Plan Note (Signed)
Adequately controlled. No changes in current management. She will continue low-salt diet. F/U in 4 months, before if needed.

## 2017-09-28 NOTE — Assessment & Plan Note (Signed)
INR goal between 2-3, today it is subtherapeutic. Coumadin dose increased, today and tomorrow she will take 2 tablets of Coumadin 3 mg, rest of the week she will continue Coumadin 3 mg. INR in a week. We discussed the importance of following dietary recommendations. Instructed about warning signs.

## 2017-09-28 NOTE — Assessment & Plan Note (Signed)
No changes in Crestor 40 mg daily, she will continue low-fat diet. Further recommendation will be given according to lipid panel results. Follow-up in 6 to 12 months.

## 2017-10-03 ENCOUNTER — Inpatient Hospital Stay: Payer: Medicare Other | Attending: Hematology | Admitting: Adult Health

## 2017-10-03 VITALS — BP 123/80 | HR 81 | Temp 97.8°F | Resp 18 | Ht 67.0 in | Wt 237.6 lb

## 2017-10-03 DIAGNOSIS — F419 Anxiety disorder, unspecified: Secondary | ICD-10-CM | POA: Diagnosis not present

## 2017-10-03 DIAGNOSIS — I2510 Atherosclerotic heart disease of native coronary artery without angina pectoris: Secondary | ICD-10-CM | POA: Diagnosis not present

## 2017-10-03 DIAGNOSIS — I10 Essential (primary) hypertension: Secondary | ICD-10-CM | POA: Insufficient documentation

## 2017-10-03 DIAGNOSIS — Z79899 Other long term (current) drug therapy: Secondary | ICD-10-CM | POA: Diagnosis not present

## 2017-10-03 DIAGNOSIS — Z79810 Long term (current) use of selective estrogen receptor modulators (SERMs): Secondary | ICD-10-CM | POA: Insufficient documentation

## 2017-10-03 DIAGNOSIS — I5022 Chronic systolic (congestive) heart failure: Secondary | ICD-10-CM | POA: Insufficient documentation

## 2017-10-03 DIAGNOSIS — M810 Age-related osteoporosis without current pathological fracture: Secondary | ICD-10-CM | POA: Insufficient documentation

## 2017-10-03 DIAGNOSIS — Z170 Estrogen receptor positive status [ER+]: Secondary | ICD-10-CM | POA: Diagnosis not present

## 2017-10-03 DIAGNOSIS — Z8673 Personal history of transient ischemic attack (TIA), and cerebral infarction without residual deficits: Secondary | ICD-10-CM | POA: Diagnosis not present

## 2017-10-03 DIAGNOSIS — E785 Hyperlipidemia, unspecified: Secondary | ICD-10-CM | POA: Insufficient documentation

## 2017-10-03 DIAGNOSIS — Z923 Personal history of irradiation: Secondary | ICD-10-CM | POA: Diagnosis not present

## 2017-10-03 DIAGNOSIS — Z7982 Long term (current) use of aspirin: Secondary | ICD-10-CM | POA: Diagnosis not present

## 2017-10-03 DIAGNOSIS — Z87891 Personal history of nicotine dependence: Secondary | ICD-10-CM

## 2017-10-03 DIAGNOSIS — Z7901 Long term (current) use of anticoagulants: Secondary | ICD-10-CM | POA: Diagnosis not present

## 2017-10-03 DIAGNOSIS — J449 Chronic obstructive pulmonary disease, unspecified: Secondary | ICD-10-CM | POA: Insufficient documentation

## 2017-10-03 DIAGNOSIS — C50412 Malignant neoplasm of upper-outer quadrant of left female breast: Secondary | ICD-10-CM | POA: Diagnosis not present

## 2017-10-03 DIAGNOSIS — I350 Nonrheumatic aortic (valve) stenosis: Secondary | ICD-10-CM | POA: Insufficient documentation

## 2017-10-03 NOTE — Progress Notes (Signed)
CLINIC:  Survivorship   REASON FOR VISIT:  Routine follow-up post-treatment for a recent history of breast cancer.  BRIEF ONCOLOGIC HISTORY:  Oncology History   Cancer Staging Malignant neoplasm of upper-outer quadrant of left breast in female, estrogen receptor positive ([place]) Staging form: Breast, AJCC 8th Edition - Clinical stage from 12/13/2016: Stage IB (cT2, cN0, cM0, G2, ER: Positive, PR: Positive, HER2: Negative) - Signed by [name], MD on 12/21/2016 - Pathologic stage from 01/14/2017: Stage IA (pT2, pN0, cM0, G1, ER: Positive, PR: Positive, HER2: Negative, Oncotype DX score: 4) - Signed by [name], MD on 04/17/2017       Malignant neoplasm of upper-outer quadrant of left breast in female, estrogen receptor positive ([place])   12/06/2016 Mammogram    Diagnostic Mammogram 12/06/16 IMPRESSION:  Suspicious mass in the left breast at 3 o'clock 2 cm from the nipple measuring 1.9 x 1.1 x 2.2 cm. RECOMMENDATION: Ultrasound-guided core biopsy of the mass in the 3 o'clock region of the left breast is recommended. The biopsy will be scheduled at the patient's convenience.      12/13/2016 Initial Biopsy    Diagnosis 12/13/16 Breast, left, needle core biopsy, 3:00 o'clock, 2cmfn - INVASIVE DUCTAL CARCINOMA - SEE COMMENT      12/16/2016 Initial Diagnosis    Malignant neoplasm of upper-outer quadrant of left breast in female, estrogen receptor positive ([place])      12/17/2016 Receptors her2    Estrogen Receptor: 100%, POSITIVE, STRONG STAINING INTENSITY Progesterone Receptor: 100%, POSITIVE, STRONG STAINING INTENSITY Proliferation Marker Ki67: 30% HER2 - NEGATIVE       01/14/2017 Surgery    Left breast lumpectomy with Dr. Donne Hazel      01/14/2017 Pathology Results    Diagnosis 01/14/17 1. Breast, lumpectomy, Left - INVASIVE DUCTAL CARCINOMA, GRADE I/III, SPANNING 2.1 CM. - DUCTAL CARCINOMA IN SITU, LOW GRADE. - INVASIVE CARCINOMA IS BROADLY PRESENT AT THE INFERIOR  MARGIN OF SPECIMEN 1. - DUCTAL CARCINOMA IN SITU IS FOCALLY PRESENT AT THE INFERIOR MARGIN OF SPECIMEN 1 AND BROADLY LESS THAN 0.1 CM TO THE LATERAL MARGIN OF SPECIMEN 1. - SEE ONCOLOGY TABLE BELOW. 2. Breast, excision, Additional medial margin left - DUCTAL CARCINOMA IN SITU, LOW GRADE. - DUCTAL CARCINOMA IS FOCALLY LESS THAN 0.1 CM TO THE NEW MARGIN OF SPECIMEN 2. 3. Breast, excision, Additional lateral margin left - DUCTAL CARCINOMA IN SITU, LOW GRADE. - DUCTAL CARCINOMA IN SITU IS GREATER THAN 0.2 CM TO ALL MARGINS. 4. Breast, excision, Additional superior margin left - DUCTAL CARCINOMA IN SITU, LOW GRADE. - DUCTAL CARCINOMA IN SITU IS BROADLY LESS THAN 0.1 CM TO THE NEW MARGIN OF SPECIMEN 4. 5. Lymph node, sentinel, biopsy, Left axillary - THERE IS NO EVIDENCE OF CARCINOMA IN 1 OF 1 LYMPH NODE (0/1). 6. Breast, excision, Additional inferior margin left - DUCTAL CARCINOMA IN SITU, LOW GRADE. - DUCTAL CARCINOMA IN SITU IS GREATER THAN 0.2 CM TO ALL MARGINS.       01/14/2017 Oncotype testing    Her oncotype recurrence score is 4 and her distance recurrent on Tamoxifen alone is 3%.      03/08/2017 - 04/05/2017 Radiation Therapy    RT with Dr. Lisbeth Renshaw       06/2017 -  Anti-estrogen oral therapy    Tamoxifen daily       INTERVAL HISTORY:  Ms. Gibbard presents to the [facility] today for our initial meeting to review her survivorship care plan detailing her treatment course for breast cancer, as  well as monitoring long-term side effects of that treatment, education regarding health maintenance, screening, and overall wellness and health promotion.     Overall, Ms. Dershem reports feeling quite well.  She is taking Tamoxifen daily and is tolerating it well.  She tells me she hasn't noted any side effects.  She does note some left breast swelling that has been present since completing radiation.  She would like to know when her breast is going to decrease in  size and what to do about it.      REVIEW OF SYSTEMS:  Review of Systems  Constitutional: Negative for appetite change, chills, fatigue, fever and unexpected weight change.  HENT:   Negative for hearing loss, lump/mass and trouble swallowing.   Eyes: Negative for eye problems and icterus.  Respiratory: Negative for chest tightness, cough and shortness of breath.   Cardiovascular: Negative for chest pain, leg swelling and palpitations.  Gastrointestinal: Negative for abdominal distention and abdominal pain.  Endocrine: Negative for hot flashes.  Skin: Negative for itching and rash.  Neurological: Negative for dizziness, extremity weakness and numbness.  Hematological: Negative for adenopathy. Does not bruise/bleed easily.  Psychiatric/Behavioral: Negative for depression. The patient is not nervous/anxious.   Breast: Denies any new nodularity, masses, tenderness, nipple changes, or nipple discharge.      ONCOLOGY TREATMENT TEAM:  1. Surgeon:  Dr. Donne Hazel at [facility] 2. Medical Oncologist: Dr. Burr Medico  3. Radiation Oncologist: Dr. Lisbeth Renshaw    PAST MEDICAL/SURGICAL HISTORY:  Past Medical History:  Diagnosis Date  . Allergy   . Anxiety   . Aortic stenosis    Status post bioprosthetic AVR  . Arthritis    DJD  . Asthma   . Breast cancer ([place]) 12/13/2016   Left breast  . Chronic systolic CHF (congestive heart failure) ([place])    EF 30% 04/2014  . COPD (chronic obstructive pulmonary disease) ([place])   . Coronary artery disease    per pt, had LHC prior to AVR in [place] that did not show any blockages; no stents/bypass  . Gastritis   . Hyperlipidemia   . Hypertension   . Hyperthyroidism   . Multiple thyroid nodules   . Osteoporosis   . Paroxysmal atrial fibrillation ([facility])   . Pelvis fracture ([place]) 08/19/2015   MULTIPLE   . Spontaneous pneumothorax 11/28/2015   left   . Stroke [facility]) 2000   rt hand weak   Past Surgical History:  Procedure Laterality Date    . BREAST LUMPECTOMY WITH RADIOACTIVE SEED AND SENTINEL LYMPH NODE BIOPSY Left 01/14/2017   Procedure: LEFT BREAST LUMPECTOMY WITH RADIOACTIVE SEED AND SENTINEL LYMPH NODE BIOPSY;  Surgeon: Rolm Bookbinder, MD;  Location: [facility];  Service: General;  Laterality: Left;  . CHEST TUBE INSERTION  10/2015  . INTRAMEDULLARY (IM) NAIL INTERTROCHANTERIC Left 12/10/2015   Procedure: INTRAMEDULLARY (IM) NAIL INTERTROCHANTRIC;  Surgeon: Leandrew Koyanagi, MD;  Location: [facility];  Service: Orthopedics;  Laterality: Left;  . PLEURADESIS Left 12/03/2015   Procedure: PLEURADESIS;  Surgeon: Melrose Nakayama, MD;  Location: [facility];  Service: Thoracic;  Laterality: Left;  . RESECTION OF APICAL BLEB Left 12/03/2015   Procedure: BLEBECTOMY;  Surgeon: Melrose Nakayama, MD;  Location: [facility];  Service: Thoracic;  Laterality: Left;  . THORACOSCOPY  12/03/2015  . VALVE REPLACEMENT    . VIDEO ASSISTED THORACOSCOPY Left 12/03/2015   Procedure: VIDEO ASSISTED THORACOSCOPY;  Surgeon: Melrose Nakayama, MD;  Location: [facility];  Service: Thoracic;  Laterality: Left;  ALLERGIES:  Allergies  Allergen Reactions  . Lisinopril Cough  . Tetanus Toxoid Adsorbed Swelling    Arm swelling     CURRENT MEDICATIONS:  Outpatient Encounter Medications as of 10/03/2017  Medication Sig  . acetaminophen (TYLENOL) 500 MG tablet Take 1,000 mg by mouth daily as needed (PAIN).  Marland Kitchen albuterol (PROVENTIL HFA;VENTOLIN HFA) 108 (90 Base) MCG/ACT inhaler Inhale into the lungs.  Marland Kitchen alendronate (FOSAMAX) 70 MG tablet Take 1 tablet (70 mg total) by mouth every Saturday. with a full glass of water on an empty stomach.  Marland Kitchen aspirin EC 81 MG tablet Take 81 mg daily by mouth.  . budesonide-formoterol (SYMBICORT) 160-4.5 MCG/ACT inhaler Inhale 2 puffs into the lungs 2 (two) times daily.  . carvedilol (COREG) 25 MG tablet Take 1 tablet (25 mg total) by mouth 2 (two) times daily.  . Cholecalciferol (VITAMIN D3) 400 units tablet Take 1 tablet (400 Units  total) by mouth daily.  . diclofenac sodium (VOLTAREN) 1 % GEL Apply 4 g topically 4 (four) times daily.  . diphenhydramine-acetaminophen (TYLENOL PM) 25-500 MG TABS tablet Take 2 tablets by mouth at bedtime as needed (SLEEP).   . furosemide (LASIX) 20 MG tablet Take 1 tablet (20 mg total) by mouth daily.  Marland Kitchen gabapentin (NEURONTIN) 100 MG capsule TAKE 1 CAPSULE BY MOUTH THREE TIMES DAILY  . losartan (COZAAR) 50 MG tablet Take 1 tablet by mouth daily  . methimazole (TAPAZOLE) 5 MG tablet TAKE 3 TABLETS BY MOUTH EVERY EVENING.  . ranitidine (ZANTAC) 150 MG tablet Take 1 tablet (150 mg total) by mouth 2 (two) times daily.  . rosuvastatin (CRESTOR) 40 MG tablet Take 1 tablet (40 mg total) by mouth daily.  Marland Kitchen senna-docusate (SENOKOT-S) 8.6-50 MG tablet Take 2 tablets at bedtime by mouth.   . tamoxifen (NOLVADEX) 20 MG tablet Take 1 tablet (20 mg total) by mouth daily.  . traMADol (ULTRAM) 50 MG tablet TAKE 1 TABLET BY MOUTH THREE TIMES DAILY  . valACYclovir (VALTREX) 500 MG tablet Take 1 tablet (500 mg total) by mouth 2 (two) times daily. For 3 days when outbreaks.  Marland Kitchen warfarin (COUMADIN) 3 MG tablet TAKE AS DIRECTED BY  ANTICOAGULATION  CLINIC  . warfarin (COUMADIN) 3 MG tablet Take as directed by anticoagulation clinic.   No facility-administered encounter medications on file as of 10/03/2017.      ONCOLOGIC FAMILY HISTORY:  Family History  Problem Relation Age of Onset  . Diabetes Mother   . Heart attack Mother 15  . Diabetes Father   . Lung cancer Father   . Diabetes Sister   . Thyroid disease Sister   . Diabetes Sister   . HIV Brother      GENETIC COUNSELING/TESTING: Not at this time  SOCIAL HISTORY:  Social History   Socioeconomic History  . Marital status: Married    Spouse name: Sherwood  . Number of children: 0  . Years of education: Not on file  . Highest education level: Not on file  Occupational History  . Occupation: Retired in 2004  Social Needs  . Financial  resource strain: Not on file  . Food insecurity:    Worry: Not on file    Inability: Not on file  . Transportation needs:    Medical: Not on file    Non-medical: Not on file  Tobacco Use  . Smoking status: Former Smoker    Packs/day: 0.25    Years: 10.00    Pack years: 2.50    Types: Cigarettes  Last attempt to quit: 03/01/2010    Years since quitting: 7.5  . Smokeless tobacco: Never Used  Substance and Sexual Activity  . Alcohol use: No  . Drug use: No  . Sexual activity: Not on file  Lifestyle  . Physical activity:    Days per week: Not on file    Minutes per session: Not on file  . Stress: Not on file  Relationships  . Social connections:    Talks on phone: Not on file    Gets together: Not on file    Attends religious service: Not on file    Active member of club or organization: Not on file    Attends meetings of clubs or organizations: Not on file    Relationship status: Not on file  . Intimate partner violence:    Fear of current or ex partner: Not on file    Emotionally abused: Not on file    Physically abused: Not on file    Forced sexual activity: Not on file  Other Topics Concern  . Not on file  Social History Narrative   Lives with husband.  Ambulated independently.      PHYSICAL EXAMINATION:  Vital Signs:   Vitals:   10/03/17 1421  BP: 123/80  Pulse: 81  Resp: 18  Temp: 97.8 F (36.6 C)  SpO2: 98%   Filed Weights   10/03/17 1421  Weight: 237 lb 9.6 oz (107.8 kg)   General: Well-nourished, well-appearing female in no acute distress.  She is unaccompanied today.   HEENT: Head is normocephalic.  Pupils equal and reactive to light. Conjunctivae clear without exudate.  Sclerae anicteric. Oral mucosa is pink, moist.  Oropharynx is pink without lesions or erythema.  Lymph: No cervical, supraclavicular, or infraclavicular lymphadenopathy noted on palpation.  Cardiovascular: Regular rate and rhythm.Marland Kitchen Respiratory: Clear to auscultation bilaterally.  Chest expansion symmetric; breathing non-labored.  Breasts: right breast without nodules, masses, skin or nipple changes, left breast with some radiation changes and mild swelling, no nodules or masses noted GI: Abdomen soft and round; non-tender, non-distended. Bowel sounds normoactive.  GU: Deferred.  Neuro: No focal deficits. Steady gait.  Psych: Mood and affect normal and appropriate for situation.  Extremities: No edema. MSK: No focal spinal tenderness to palpation.  Full range of motion in bilateral upper extremities Skin: Warm and dry.  LABORATORY DATA:  None for this visit.  DIAGNOSTIC IMAGING:  None for this visit.      ASSESSMENT AND PLAN:  Ms.. Duquette is a pleasant 72 y.o. female with Stage IA left breast invasive ductal carcinoma, ER+/PR+/HER2-, diagnosed in 11/2016, treated with lumpectomy, adjuvant radiation therapy, and anti-estrogen therapy with Tamoxifen beginning in 06/2017.  She presents to the Survivorship Clinic for our initial meeting and routine follow-up post-completion of treatment for breast cancer.    1. Stage IA left breast cancer:  Ms. Titsworth is continuing to recover from definitive treatment for breast cancer. She will follow-up with her medical oncologist, Dr. Burr Medico in 12/2017 with history and physical exam per surveillance protocol.  She will continue her anti-estrogen therapy with Tamoxifen. Thus far, she is tolerating the Tamoxifen well, with minimal side effects. Today, a comprehensive survivorship care plan and treatment summary was reviewed with the patient today detailing her breast cancer diagnosis, treatment course, potential late/long-term effects of treatment, appropriate follow-up care with recommendations for the future, and patient education resources.  A copy of this summary, along with a letter will be sent to the patient's primary  care provider via mail/fax/In Basket message after today's visit.    2. Breast swelling: I  reviewed that she can massage her breast with vitamin e oil, and that she can get compression bras at [employer] if she likes them.  If her swelling persists or worsens despite these interventions would recommend physical therapy.    3. Bone health:  Given [name]'s age/history of breast cancer, she is at risk for bone demineralization.  I counseled her that Tamoxifen will have a protective effect on her bones.  She tells me she has osteoporosis and is followed by her PCP for this.  She was given education on specific activities to promote bone health.  4. Cancer screening:  Due to [name]'s history and her age, she should receive screening for skin cancers, colon cancer, and gynecologic cancers.  The information and recommendations are listed on the patient's comprehensive care plan/treatment summary and were reviewed in detail with the patient.    5. Health maintenance and wellness promotion: Ms. Canepa was encouraged to consume 5-7 servings of fruits and vegetables per day. We reviewed the "Nutrition Rainbow" handout, as well as the handout "Take Control of Your Health and Reduce Your Cancer Risk" from the [facility].  She was also encouraged to engage in moderate to vigorous exercise for 30 minutes per day most days of the week. We discussed the [employer] fitness program, which is designed for cancer survivors to help them become more physically fit after cancer treatments.  She was instructed to limit her alcohol consumption and continue to abstain from tobacco use.     6. Support services/counseling: It is not uncommon for this period of the patient's cancer care trajectory to be one of many emotions and stressors.  We discussed an opportunity for her to participate in the next session of [facility] ("Finding Your New Normal") support group series designed for patients after they have completed treatment.   Ms. Achee was encouraged  to take advantage of our many other support services programs, support groups, and/or counseling in coping with her new life as a cancer survivor after completing anti-cancer treatment.  She was offered support today through active listening and expressive supportive counseling.  She was given information regarding our available services and encouraged to contact me with any questions or for help enrolling in any of our support group/programs.    Dispo:   -Return to cancer center 12/2017 for f/u with Dr. Burr Medico -Mammogram due in 11/2017 -She is welcome to return back to the Survivorship Clinic at any time; no additional follow-up needed at this time.  -Consider referral back to survivorship as a long-term survivor for continued surveillance  A total of (30) minutes of face-to-face time was spent with this patient with greater than 50% of that time in counseling and care-coordination.   Gardenia Phlegm, NP Survivorship Program [facility] [phone]   Note: PRIMARY CARE PROVIDER [name], [name], [place] [phone]

## 2017-10-03 NOTE — Progress Notes (Signed)
duplicate

## 2017-10-04 ENCOUNTER — Telehealth: Payer: Self-pay | Admitting: Adult Health

## 2017-10-04 ENCOUNTER — Encounter: Payer: Self-pay | Admitting: Adult Health

## 2017-10-04 NOTE — Telephone Encounter (Signed)
Per 8/5 no los °

## 2017-10-05 ENCOUNTER — Ambulatory Visit (INDEPENDENT_AMBULATORY_CARE_PROVIDER_SITE_OTHER): Payer: Medicare Other | Admitting: General Practice

## 2017-10-05 DIAGNOSIS — N183 Chronic kidney disease, stage 3 unspecified: Secondary | ICD-10-CM

## 2017-10-05 DIAGNOSIS — Z7901 Long term (current) use of anticoagulants: Secondary | ICD-10-CM

## 2017-10-05 LAB — MICROALBUMIN / CREATININE URINE RATIO
Creatinine,U: 23.7 mg/dL
Microalb Creat Ratio: 3 mg/g (ref 0.0–30.0)
Microalb, Ur: <0.7 mg/dL (ref 0.0–1.9)

## 2017-10-05 LAB — POCT INR: INR: 2.5 (ref 2.0–3.0)

## 2017-10-05 NOTE — Patient Instructions (Addendum)
Pre visit review using our clinic review tool, if applicable. No additional management support is needed unless otherwise documented below in the visit note.  Continue to take 1 tablet daily except 1 1/2 tablets on Wednesdays.  Re-check in 2 weeks.

## 2017-10-11 NOTE — Progress Notes (Signed)
I have reviewed documentation from this visit and I agree with recommendations given.  [name]. [name], MD  [facility]. [place] office.   

## 2017-10-13 ENCOUNTER — Ambulatory Visit: Payer: Medicare Other | Admitting: Internal Medicine

## 2017-10-13 ENCOUNTER — Encounter: Payer: Self-pay | Admitting: Internal Medicine

## 2017-10-13 VITALS — BP 130/86 | HR 85 | Ht 67.0 in | Wt 238.8 lb

## 2017-10-13 DIAGNOSIS — I5022 Chronic systolic (congestive) heart failure: Secondary | ICD-10-CM

## 2017-10-13 DIAGNOSIS — I359 Nonrheumatic aortic valve disorder, unspecified: Secondary | ICD-10-CM | POA: Diagnosis not present

## 2017-10-13 DIAGNOSIS — I480 Paroxysmal atrial fibrillation: Secondary | ICD-10-CM

## 2017-10-13 DIAGNOSIS — I428 Other cardiomyopathies: Secondary | ICD-10-CM | POA: Diagnosis not present

## 2017-10-13 DIAGNOSIS — Z952 Presence of prosthetic heart valve: Secondary | ICD-10-CM | POA: Diagnosis not present

## 2017-10-13 NOTE — Patient Instructions (Addendum)
Medication Instructions:  Your physician recommends that you continue on your current medications as directed. Please refer to the Current Medication list given to you today.  -- If you need a refill on your cardiac medications before your next appointment, please call your pharmacy. --  Labwork: None ordered  Testing/Procedures: None ordered  Follow-Up: Your physician wants you to follow-up in: 6 months with Dr. Saunders Revel in [place] will receive a reminder letter in the mail two months in advance. If you don't receive a letter, please call our office to schedule the follow-up appointment.  Thank you for choosing [facility]!!    Any Other Special Instructions Will Be Listed Below (If Applicable).

## 2017-10-13 NOTE — Progress Notes (Signed)
Follow-up Outpatient Visit Date: 10/13/2017  Primary Care Provider: [name], [name], MD [address] [place] [address]  Chief Complaint: Follow-up heart failure and valvular heart disease  HPI:  Danielle [name] is a 72 y.o. year-old female with history of aortic valve disease (severe regurgitation by her description) s/p bioprosthetic aortic valve replacement in [place] in 03/2014, NICM with LVEF as low as 30-35% by report in 03/2015, paroxysmal atrial fibrillation on chronic warfarin, stroke x 2, hyperlipidemia, hypertension, hyperthyroidism, left breast cancer, spontaneous pneumothorax, and left hip fracture, who presents for follow-up of heart failure and valvular heart disease.  I last saw her in February, at which time she reported stable dyspnea on exertion.  At that time, we agreed to increase losartan to 50 mg daily.  Today, the patient reports feeling well.  She denies chest pain, shortness of breath, palpitations, lightheadedness, and edema.  She has chronic 2 pillow orthopnea predating her aortic valve replacement, which is unchanged.  She has not had any significant bleeding, remaining on warfarin and aspirin.  --------------------------------------------------------------------------------------------------  Cardiovascular History & Procedures: Cardiovascular Problems:  Aortic valve disease status post bioprosthetic AVR in 03/2014  Non-ischemic cardiomyopathy  Paroxysmal atrial fibrillation  Stroke  Risk Factors:  Hypertension, hyperlipidemia, stroke, and age > 82  Cath/PCI:  None available (patient reports cath without significant CAD in the past)  CV Surgery:  Bioprosthetic aortic valve replacement (03/12/14, [place], [place])  EP Procedures and Devices:  None  Non-Invasive Evaluation(s):  TTE (04/21/16): Normal obese size with moderate LVH. LVEF 35-40% with mid and apical anteroseptal hypokinesis. Grade 3 diastolic  dysfunction noted. Aortic valve bioprosthesis present with a mean gradient of 11 mmHg. Mitral annular calcification noted. Normal RV size and function. Mild right atrial enlargement.  TTE (03/27/15, OSH): Mild LVH with LVEF 30-35%, mild left atrial enlargement, AVR in place without regurgitation.  TTE (05/10/14, OSH): LVEF 45-50%.  Recent CV Pertinent Labs: Lab Results  Component Value Date   CHOL 154 09/28/2017   HDL 64.10 09/28/2017   LDLCALC 71 09/28/2017   TRIG 94.0 09/28/2017   CHOLHDL 2 09/28/2017   INR 2.5 10/05/2017   INR 1.97 03/15/2017   K 5.0 09/28/2017   K 4.3 12/22/2016   BUN 16 09/28/2017   BUN 19 04/28/2017   BUN 19.5 12/22/2016   CREATININE 1.17 09/28/2017   CREATININE 1.24 (H) 07/04/2017   CREATININE 1.2 (H) 12/22/2016    Past medical and surgical history were reviewed and updated in EPIC.  Current Meds  Medication Sig  . acetaminophen (TYLENOL) 500 MG tablet Take 1,000 mg by mouth daily as needed (PAIN).  Marland Kitchen albuterol (PROVENTIL HFA;VENTOLIN HFA) 108 (90 Base) MCG/ACT inhaler Inhale into the lungs.  Marland Kitchen alendronate (FOSAMAX) 70 MG tablet Take 1 tablet (70 mg total) by mouth every Saturday. with a full glass of water on an empty stomach.  Marland Kitchen aspirin EC 81 MG tablet Take 81 mg daily by mouth.  . budesonide-formoterol (SYMBICORT) 160-4.5 MCG/ACT inhaler Inhale 2 puffs into the lungs 2 (two) times daily.  . carvedilol (COREG) 25 MG tablet Take 1 tablet (25 mg total) by mouth 2 (two) times daily.  . Cholecalciferol (VITAMIN D3) 400 units tablet Take 1 tablet (400 Units total) by mouth daily.  . diclofenac sodium (VOLTAREN) 1 % GEL Apply 4 g topically 4 (four) times daily.  . diphenhydramine-acetaminophen (TYLENOL PM) 25-500 MG TABS tablet Take 2 tablets by mouth at bedtime as needed (SLEEP).   . furosemide (LASIX)  20 MG tablet Take 1 tablet (20 mg total) by mouth daily.  Marland Kitchen gabapentin (NEURONTIN) 100 MG capsule TAKE 1 CAPSULE BY MOUTH THREE TIMES DAILY  . losartan  (COZAAR) 50 MG tablet Take 1 tablet by mouth daily  . methimazole (TAPAZOLE) 5 MG tablet TAKE 3 TABLETS BY MOUTH EVERY EVENING.  . ranitidine (ZANTAC) 150 MG tablet Take 1 tablet (150 mg total) by mouth 2 (two) times daily.  . rosuvastatin (CRESTOR) 40 MG tablet Take 1 tablet (40 mg total) by mouth daily.  Marland Kitchen senna-docusate (SENOKOT-S) 8.6-50 MG tablet Take 2 tablets at bedtime by mouth.   . tamoxifen (NOLVADEX) 20 MG tablet Take 1 tablet (20 mg total) by mouth daily.  . traMADol (ULTRAM) 50 MG tablet TAKE 1 TABLET BY MOUTH THREE TIMES DAILY  . valACYclovir (VALTREX) 500 MG tablet Take 1 tablet (500 mg total) by mouth 2 (two) times daily. For 3 days when outbreaks.  Marland Kitchen warfarin (COUMADIN) 3 MG tablet TAKE AS DIRECTED BY  ANTICOAGULATION  CLINIC (Patient taking differently: Take 3 mg by mouth. Take as directed by anticoagulation clinic.)    Allergies: Lisinopril and Tetanus toxoid adsorbed  Social History   Tobacco Use  . Smoking status: Former Smoker    Packs/day: 0.25    Years: 10.00    Pack years: 2.50    Types: Cigarettes    Last attempt to quit: 03/01/2010    Years since quitting: 7.6  . Smokeless tobacco: Never Used  Substance Use Topics  . Alcohol use: No  . Drug use: No    Family History  Problem Relation Age of Onset  . Diabetes Mother   . Heart attack Mother 66  . Diabetes Father   . Lung cancer Father   . Diabetes Sister   . Thyroid disease Sister   . Diabetes Sister   . HIV Brother     Review of Systems: A 12-system review of systems was performed and was negative except as noted in the HPI.  --------------------------------------------------------------------------------------------------  Physical Exam: BP 130/86   Pulse 85   Ht 5\' 7"  (1.702 m)   Wt 238 lb 12.8 oz (108.3 kg)   SpO2 97%   BMI 37.40 kg/m   General: NAD. HEENT: No conjunctival pallor or scleral icterus. Moist mucous membranes.  OP clear. Neck: Supple without lymphadenopathy, thyromegaly,  JVD, or HJR. No carotid bruit. Lungs: Normal work of breathing. Clear to auscultation bilaterally without wheezes or crackles. Heart: Regular rate and rhythm with 1/6 systolic murmur.  No rubs or gallops.  Unable to assess PMI due to body habitus. Abd: Bowel sounds present. Soft, NT/ND.  Unable to assess HSM due to body habitus. Ext: No lower extremity edema. Skin: Warm and dry without rash.  EKG: Normal sinus rhythm with LVH and diffuse Q waves.  No significant change since 04/14/2017.  Lab Results  Component Value Date   WBC 5.1 07/04/2017   HGB 12.3 07/04/2017   HCT 38.1 07/04/2017   MCV 97.4 07/04/2017   PLT 101 (L) 07/04/2017    Lab Results  Component Value Date   NA 142 09/28/2017   K 5.0 09/28/2017   CL 109 09/28/2017   CO2 29 09/28/2017   BUN 16 09/28/2017   CREATININE 1.17 09/28/2017   GLUCOSE 90 09/28/2017   ALT 10 07/04/2017    Lab Results  Component Value Date   CHOL 154 09/28/2017   HDL 64.10 09/28/2017   LDLCALC 71 09/28/2017   TRIG 94.0 09/28/2017  CHOLHDL 2 09/28/2017    --------------------------------------------------------------------------------------------------  ASSESSMENT AND PLAN: Chronic systolic heart failure due to nonischemic cardiomyopathy Patient appears euvolemic and well compensated with NYHA class II heart failure symptoms.  I am hesitant to escalate losartan or add spironolactone at this time given upper normal potassium on multiple checks.  We will continue current doses of carvedilol and losartan.    Aortic valve disease status post AVR No symptoms to suggest valve degeneration.  Continue aspirin follow-up.  Paroxysmal atrial fibrillation No symptoms to suggest recurrence.  Continue indefinite anticoagulation with warfarin.  Follow-up: Return to clinic in 6 months to see me in [place].  [name], MD 10/15/2017 10:39 AM

## 2017-10-15 ENCOUNTER — Encounter: Payer: Self-pay | Admitting: Internal Medicine

## 2017-10-17 ENCOUNTER — Encounter: Payer: Self-pay | Admitting: Physical Medicine & Rehabilitation

## 2017-10-17 ENCOUNTER — Ambulatory Visit: Payer: Medicare Other | Admitting: Physical Medicine & Rehabilitation

## 2017-10-17 ENCOUNTER — Encounter: Payer: Medicare Other | Attending: Physical Medicine & Rehabilitation

## 2017-10-17 VITALS — BP 133/86 | HR 80 | Resp 14 | Ht 67.0 in | Wt 238.0 lb

## 2017-10-17 DIAGNOSIS — G894 Chronic pain syndrome: Secondary | ICD-10-CM

## 2017-10-17 DIAGNOSIS — Z853 Personal history of malignant neoplasm of breast: Secondary | ICD-10-CM | POA: Insufficient documentation

## 2017-10-17 DIAGNOSIS — E785 Hyperlipidemia, unspecified: Secondary | ICD-10-CM | POA: Insufficient documentation

## 2017-10-17 DIAGNOSIS — Z79891 Long term (current) use of opiate analgesic: Secondary | ICD-10-CM | POA: Insufficient documentation

## 2017-10-17 DIAGNOSIS — M47816 Spondylosis without myelopathy or radiculopathy, lumbar region: Secondary | ICD-10-CM | POA: Diagnosis not present

## 2017-10-17 DIAGNOSIS — I480 Paroxysmal atrial fibrillation: Secondary | ICD-10-CM | POA: Insufficient documentation

## 2017-10-17 DIAGNOSIS — M810 Age-related osteoporosis without current pathological fracture: Secondary | ICD-10-CM | POA: Insufficient documentation

## 2017-10-17 DIAGNOSIS — J449 Chronic obstructive pulmonary disease, unspecified: Secondary | ICD-10-CM | POA: Insufficient documentation

## 2017-10-17 DIAGNOSIS — Z5181 Encounter for therapeutic drug level monitoring: Secondary | ICD-10-CM

## 2017-10-17 DIAGNOSIS — I110 Hypertensive heart disease with heart failure: Secondary | ICD-10-CM | POA: Insufficient documentation

## 2017-10-17 DIAGNOSIS — M48061 Spinal stenosis, lumbar region without neurogenic claudication: Secondary | ICD-10-CM | POA: Diagnosis not present

## 2017-10-17 DIAGNOSIS — F419 Anxiety disorder, unspecified: Secondary | ICD-10-CM | POA: Diagnosis not present

## 2017-10-17 DIAGNOSIS — I5022 Chronic systolic (congestive) heart failure: Secondary | ICD-10-CM | POA: Diagnosis not present

## 2017-10-17 DIAGNOSIS — Z87891 Personal history of nicotine dependence: Secondary | ICD-10-CM | POA: Diagnosis not present

## 2017-10-17 DIAGNOSIS — I2510 Atherosclerotic heart disease of native coronary artery without angina pectoris: Secondary | ICD-10-CM | POA: Insufficient documentation

## 2017-10-17 DIAGNOSIS — M159 Polyosteoarthritis, unspecified: Secondary | ICD-10-CM

## 2017-10-17 MED ORDER — TRAMADOL HCL 50 MG PO TABS: 50.0000 mg | ORAL_TABLET | Freq: Three times a day (TID) | ORAL | 5 refills | Status: DC

## 2017-10-17 MED ORDER — GABAPENTIN 300 MG PO CAPS: 300.0000 mg | ORAL_CAPSULE | Freq: Three times a day (TID) | ORAL | 1 refills | Status: DC

## 2017-10-17 NOTE — Progress Notes (Signed)
Subjective:    Patient ID: Danielle [name], female    DOB: 07-28-1945, 72 y.o.   MRN: [id number]  HPI CC low back and hip pain 72 year old female who is completed treatment of invasive ductal carcinoma right breast.  She has received chemotherapy and radiation. She is now in the survivors clinic. We reviewed the MRI of the lumbar spine from February 2019.  There is no evidence of metastasis to the spine. We discussed the degenerative changes in the lower lumbar facet joints. Her pain is fairly constant and is moderate in severity.  Walking exacerbates her pain. Does pedaling machine Tramadol helpful but gabapentin 100mg  TID was not Pain Inventory Average Pain 5 Pain Right Now 4 My pain is constant  In the last 24 hours, has pain interfered with the following? General activity 5 Relation with others 4 Enjoyment of life 0 What TIME of day is your pain at its worst? varies Sleep (in general) NA  Pain is worse with: walking Pain improves with: medication Relief from Meds: 5  Mobility walk with assistance use a walker ability to climb steps?  no do you drive?  no  Function retired  Neuro/Psych trouble walking  Prior Studies Any changes since last visit?  no  Physicians involved in your care Any changes since last visit?  no   Family History  Problem Relation Age of Onset  . Diabetes Mother   . Heart attack Mother 77  . Diabetes Father   . Lung cancer Father   . Diabetes Sister   . Thyroid disease Sister   . Diabetes Sister   . HIV Brother    Social History   Socioeconomic History  . Marital status: Married    Spouse name: Sherwood  . Number of children: 0  . Years of education: Not on file  . Highest education level: Not on file  Occupational History  . Occupation: Retired in 2004  Social Needs  . Financial resource strain: Not on file  . Food insecurity:    Worry: Not on file    Inability: Not on file  . Transportation needs:   Medical: Not on file    Non-medical: Not on file  Tobacco Use  . Smoking status: Former Smoker    Packs/day: 0.25    Years: 10.00    Pack years: 2.50    Types: Cigarettes    Last attempt to quit: 03/01/2010    Years since quitting: 7.6  . Smokeless tobacco: Never Used  Substance and Sexual Activity  . Alcohol use: No  . Drug use: No  . Sexual activity: Not on file  Lifestyle  . Physical activity:    Days per week: Not on file    Minutes per session: Not on file  . Stress: Not on file  Relationships  . Social connections:    Talks on phone: Not on file    Gets together: Not on file    Attends religious service: Not on file    Active member of club or organization: Not on file    Attends meetings of clubs or organizations: Not on file    Relationship status: Not on file  Other Topics Concern  . Not on file  Social History Narrative   Lives with husband.  Ambulated independently.   Past Surgical History:  Procedure Laterality Date  . BREAST LUMPECTOMY WITH RADIOACTIVE SEED AND SENTINEL LYMPH NODE BIOPSY Left 01/14/2017   Procedure: LEFT BREAST LUMPECTOMY WITH RADIOACTIVE SEED AND SENTINEL LYMPH NODE  BIOPSY;  Surgeon: Rolm Bookbinder, MD;  Location: [facility];  Service: General;  Laterality: Left;  . CHEST TUBE INSERTION  10/2015  . INTRAMEDULLARY (IM) NAIL INTERTROCHANTERIC Left 12/10/2015   Procedure: INTRAMEDULLARY (IM) NAIL INTERTROCHANTRIC;  Surgeon: Leandrew Koyanagi, MD;  Location: [facility];  Service: Orthopedics;  Laterality: Left;  . PLEURADESIS Left 12/03/2015   Procedure: PLEURADESIS;  Surgeon: Melrose Nakayama, MD;  Location: [facility];  Service: Thoracic;  Laterality: Left;  . RESECTION OF APICAL BLEB Left 12/03/2015   Procedure: BLEBECTOMY;  Surgeon: Melrose Nakayama, MD;  Location: [facility];  Service: Thoracic;  Laterality: Left;  . THORACOSCOPY  12/03/2015  . VALVE REPLACEMENT    . VIDEO ASSISTED THORACOSCOPY Left 12/03/2015   Procedure: VIDEO ASSISTED THORACOSCOPY;   Surgeon: Melrose Nakayama, MD;  Location: [facility];  Service: Thoracic;  Laterality: Left;   Past Medical History:  Diagnosis Date  . Allergy   . Anxiety   . Aortic stenosis    Status post bioprosthetic AVR  . Arthritis    DJD  . Asthma   . Breast cancer ([facility]) 12/13/2016   Left breast  . Chronic systolic CHF (congestive heart failure) ([facility])    EF 30% 04/2014  . COPD (chronic obstructive pulmonary disease) ([facility])   . Coronary artery disease    per pt, had LHC prior to AVR in [place] that did not show any blockages; no stents/bypass  . Gastritis   . Hyperlipidemia   . Hypertension   . Hyperthyroidism   . Multiple thyroid nodules   . Osteoporosis   . Paroxysmal atrial fibrillation ([facility])   . Pelvis fracture ([facility]) 08/19/2015   MULTIPLE   . Spontaneous pneumothorax 11/28/2015   left   . Stroke ([facility]) 2000   rt hand weak   BP 133/86 (BP Location: Right Arm, Patient Position: Sitting, Cuff Size: Large)   Pulse 80   Resp 14   Ht 5\' 7"  (1.702 m)   Wt 238 lb (108 kg)   SpO2 95%   BMI 37.28 kg/m   Opioid Risk Score:   Fall Risk Score:  `1  Depression screen PHQ 2/9  Depression screen [name] 2/9 10/01/2017 09/28/2017 06/21/2017 05/03/2017 01/18/2017 01/13/2017 12/07/2016  Decreased Interest 0 0 0 0 0 0 0  Down, Depressed, Hopeless 0 0 0 0 0 0 0  PHQ - 2 Score 0 0 0 0 0 0 0  Altered sleeping - - - - - - 2  Tired, decreased energy - - - - - - 2  Change in appetite - - - - - - 2  Feeling bad or failure about yourself  - - - - - - 0  Trouble concentrating - - - - - - 2  Moving slowly or fidgety/restless - - - - - - 2  Suicidal thoughts - - - - - - 0  PHQ-9 Score - - - - - - 10  Difficult doing work/chores - - - - - - Not difficult at all  Some recent data might be hidden    Review of Systems  Constitutional: Negative.   HENT: Negative.   Eyes: Negative.   Respiratory: Negative.   Cardiovascular: Negative.   Gastrointestinal: Negative.   Endocrine: Negative.     Genitourinary: Negative.   Musculoskeletal: Positive for arthralgias, back pain and gait problem.  Skin: Negative.   Allergic/Immunologic: Negative.   Psychiatric/Behavioral: Negative.   All other systems reviewed and are negative.  Objective:   Physical Exam  Constitutional: She is oriented to person, place, and time. She appears well-developed and well-nourished.  HENT:  Head: Normocephalic.  Eyes: Pupils are equal, round, and reactive to light. EOM are normal.  Neck: Normal range of motion.  Musculoskeletal:  Tenderness to palpation right greater than left lumbar paraspinal L4 L5-S1 regions Pain is most restricted with extension and causes pain right greater than left low back  Neurological: She is alert and oriented to person, place, and time.  Skin: Skin is warm and dry.  Psychiatric: She has a normal mood and affect. Her behavior is normal. Thought content normal.  Nursing note and vitals reviewed.   Motor strength is 5/5 bilateral hip flexor knee extensor ankle dorsiflexor Extremities without edema Lumbar spine has 25% flexion extension lateral rotation and bending Negative straight leg raising Ambulates with walker no evidence of toe drag or knee instability.      Assessment & Plan:  1.  Chronic low back pain has some radiation toward left hip, she has restricted lumbar extension greater than with flexion.  I suspect this is most likely related to lumbar facet joint arthropathy.  This is seen on her imaging studies.  She also has pain that is exacerbated by walking which would fit with this etiology. We will schedule for bilateral lumbar L3-L4 medial branch and L5 dorsal ramus injections. She may have this injection performed while fully anticoagulated We will continue tramadol 50 mg 3 times per day 2.  Radicular discomfort she does have evidence of spinal stenosis, she did not respond to a 100 mg dose of gabapentin therefore will increase to 300 mg and monitor for  sedation. Consider epidural injection if this is not helpful or she experiences side effects from medication, she would need to be off anticoagulation for this procedure

## 2017-10-19 ENCOUNTER — Ambulatory Visit (INDEPENDENT_AMBULATORY_CARE_PROVIDER_SITE_OTHER): Payer: Medicare Other | Admitting: General Practice

## 2017-10-19 DIAGNOSIS — Z7901 Long term (current) use of anticoagulants: Secondary | ICD-10-CM | POA: Diagnosis not present

## 2017-10-19 LAB — POCT INR: INR: 2 (ref 2.0–3.0)

## 2017-10-19 NOTE — Patient Instructions (Addendum)
Pre visit review using our clinic review tool, if applicable. No additional management support is needed unless otherwise documented below in the visit note. ° °Continue to take 1 tablet daily except 1 1/2 tablets on Wednesdays. Re-check in 4 weeks. °

## 2017-10-21 ENCOUNTER — Telehealth: Payer: Self-pay | Admitting: General Practice

## 2017-10-21 LAB — TOXASSURE SELECT,+ANTIDEPR,UR

## 2017-10-21 NOTE — Telephone Encounter (Signed)
LMOVM for patient to call Villa Herb, RN in reference to holding coumadin for procedure on 9/9.

## 2017-10-24 ENCOUNTER — Telehealth: Payer: Self-pay | Admitting: General Practice

## 2017-10-24 NOTE — Telephone Encounter (Signed)
LMOVM for patient to call Villa Herb, RN @ [phone].  Patient has a spinal injection scheduled for 9/9.  Her last dose of coumadin will need to be on Wednesday, 9/4.  Patient will need to resume scheduled coumadin dosing on 9/10.  Dr. [name] does recommend Lovenox bridge if at all possible.

## 2017-10-24 NOTE — Progress Notes (Signed)
I have reviewed documentation from this visit and I agree with recommendations given.  Layal Javid G. Jailene Cupit, MD  [facility]. [place] office.   

## 2017-10-25 ENCOUNTER — Telehealth: Payer: Self-pay | Admitting: *Deleted

## 2017-10-25 NOTE — Telephone Encounter (Signed)
Urine drug screen for this encounter is consistent for prescribed medication 

## 2017-10-26 ENCOUNTER — Telehealth: Payer: Self-pay | Admitting: General Practice

## 2017-10-26 NOTE — Telephone Encounter (Signed)
Instructions for coumadin and Lovenox pre and post procedure on 9/9.  9/4 - Last dose of coumadin until after procedure. 9/5 - Nothing (No coumadin and No Lovenox) 9/6 - Lovenox in the AM 9/7 - Lovenox in the AM 9/8 - Lovenox in the AM 9/9 - Procedure (No Lovenox today) 9/10 - Lovenox in the AM and 2 tablets of coumadin 9/11 - Lovenox in the AM and 2 tablets of coumadin 9/12 - Lovenox in the AM and 1 1/2 tablets of coumadin 9/13 - Lovenox in the AM and 1 1/2 tablets of coumadin 9/14 - Stop Lovenox and continue coumadin 1 tablet daily except 1 1/2 tablets on Wednesdays.  9/16 Check INR

## 2017-10-27 ENCOUNTER — Telehealth: Payer: Self-pay | Admitting: General Practice

## 2017-10-27 ENCOUNTER — Other Ambulatory Visit: Payer: Self-pay | Admitting: General Practice

## 2017-10-27 DIAGNOSIS — I480 Paroxysmal atrial fibrillation: Secondary | ICD-10-CM

## 2017-10-27 MED ORDER — ENOXAPARIN SODIUM 150 MG/ML ~~LOC~~ SOLN: 150.0000 mg | SUBCUTANEOUS | 0 refills | Status: DC

## 2017-10-27 NOTE — Telephone Encounter (Signed)
-----   Message from [name], MD sent at 10/26/2017  6:01 PM EDT ----- Regarding: RE: Lovenox bridge That is fine. Thank you, BJ ----- Message ----- From: Warden Fillers, RN Sent: 10/26/2017   3:38 PM EDT To: [name], MD Subject: Lovenox bridge                                 Dr. [name],  Patient has spinal injection scheduled for 9/9.  She has decided to do the Lovenox bridge.  If OK with you I will go ahead and dose and get the Lovenox called in to the pharmacy.  Please advise.

## 2017-10-28 MED ORDER — WARFARIN SODIUM 3 MG PO TABS: ORAL_TABLET | ORAL | 3 refills | Status: DC

## 2017-11-07 ENCOUNTER — Other Ambulatory Visit: Payer: Self-pay | Admitting: General Practice

## 2017-11-07 ENCOUNTER — Telehealth: Payer: Self-pay

## 2017-11-07 ENCOUNTER — Telehealth: Payer: Self-pay | Admitting: Family Medicine

## 2017-11-07 ENCOUNTER — Ambulatory Visit: Payer: Medicare Other | Admitting: Physical Medicine & Rehabilitation

## 2017-11-07 DIAGNOSIS — I480 Paroxysmal atrial fibrillation: Secondary | ICD-10-CM

## 2017-11-07 MED ORDER — WARFARIN SODIUM 3 MG PO TABS: ORAL_TABLET | ORAL | 3 refills | Status: DC

## 2017-11-07 NOTE — Telephone Encounter (Signed)
Spoke with patient and confirmed how she was taking the coumadin. Confirmed with patient directions that Caren Griffins resent this morning. Patient verbalized understanding and stated that all her bottles have always stated " Take as directed." Patient then stated that pharmacy said that they would refill it. Told patient to give clinic a call back if she needed anymore assistance.

## 2017-11-07 NOTE — Telephone Encounter (Signed)
Pt called stating that she has just talked to [facility] pharmacist and they are saying they still will not refill it with the directions that are on it. She is requesting Dr [name] or her nurse call the pharmacy if possible. Please advise.

## 2017-11-07 NOTE — Telephone Encounter (Signed)
Rx resent by [name], RN.

## 2017-11-07 NOTE — Telephone Encounter (Signed)
Copied from [place] [id number]. Topic: Quick Communication - See Telephone Encounter >> Nov 07, 2017  9:51 AM Conception Chancy, NT wrote: CRM for notification. See Telephone encounter for: 11/07/17.  Patient is calling and states that she is trying to get a refill on warfarin (COUMADIN) 3 MG tablet but the pharmacy is telling her the directions say as directed and he can not refill it with those directions. She states her instructions has always been the same.  [place], [place] - [address] [place] [address] Phone: [phone] Fax: [phone]

## 2017-11-07 NOTE — Telephone Encounter (Signed)
Copied from [place] [id number]. Topic: General - Other >> Nov 07, 2017  4:25 PM Sheran Luz wrote: Pt is requesting a call back from Caren Griffins in the [facility] if possible.

## 2017-11-14 ENCOUNTER — Ambulatory Visit (INDEPENDENT_AMBULATORY_CARE_PROVIDER_SITE_OTHER): Payer: Medicare Other | Admitting: General Practice

## 2017-11-14 ENCOUNTER — Other Ambulatory Visit: Payer: Self-pay | Admitting: *Deleted

## 2017-11-14 DIAGNOSIS — Z7901 Long term (current) use of anticoagulants: Secondary | ICD-10-CM | POA: Diagnosis not present

## 2017-11-14 DIAGNOSIS — I480 Paroxysmal atrial fibrillation: Secondary | ICD-10-CM

## 2017-11-14 LAB — POCT INR: INR: 2.3 (ref 2.0–3.0)

## 2017-11-14 NOTE — Patient Instructions (Signed)
Pre visit review using our clinic review tool, if applicable. No additional management support is needed unless otherwise documented below in the visit note. ° °Continue to take 1 tablet daily except 1 1/2 tablets on Wednesdays. Re-check in 4 weeks. °

## 2017-12-08 ENCOUNTER — Ambulatory Visit (INDEPENDENT_AMBULATORY_CARE_PROVIDER_SITE_OTHER): Payer: Medicare Other | Admitting: Family Medicine

## 2017-12-08 ENCOUNTER — Ambulatory Visit (INDEPENDENT_AMBULATORY_CARE_PROVIDER_SITE_OTHER): Payer: Medicare Other | Admitting: *Deleted

## 2017-12-08 VITALS — BP 120/80 | HR 81 | Temp 98.3°F | Wt 244.0 lb

## 2017-12-08 DIAGNOSIS — Z23 Encounter for immunization: Secondary | ICD-10-CM | POA: Diagnosis not present

## 2017-12-08 DIAGNOSIS — E559 Vitamin D deficiency, unspecified: Secondary | ICD-10-CM

## 2017-12-12 ENCOUNTER — Ambulatory Visit: Payer: Medicare Other

## 2017-12-14 ENCOUNTER — Ambulatory Visit (INDEPENDENT_AMBULATORY_CARE_PROVIDER_SITE_OTHER): Payer: Medicare Other | Admitting: General Practice

## 2017-12-14 DIAGNOSIS — Z7901 Long term (current) use of anticoagulants: Secondary | ICD-10-CM | POA: Diagnosis not present

## 2017-12-14 LAB — POCT INR: INR: 2.3 (ref 2.0–3.0)

## 2017-12-14 NOTE — Progress Notes (Signed)
I have reviewed documentation from this visit and I agree with recommendations given.  [name]. [name], MD  [facility]. [place] office.   

## 2017-12-14 NOTE — Patient Instructions (Signed)
Pre visit review using our clinic review tool, if applicable. No additional management support is needed unless otherwise documented below in the visit note. ° °Continue to take 1 tablet daily except 1 1/2 tablets on Wednesdays. Re-check in 4 weeks. °

## 2017-12-15 ENCOUNTER — Ambulatory Visit
Admission: RE | Admit: 2017-12-15 | Discharge: 2017-12-15 | Disposition: A | Discharge status: Home / Self Care | Payer: Medicare Other | Source: Ambulatory Visit | Attending: Adult Health | Admitting: Adult Health

## 2017-12-15 DIAGNOSIS — Z170 Estrogen receptor positive status [ER+]: Principal | ICD-10-CM

## 2017-12-15 DIAGNOSIS — R922 Inconclusive mammogram: Secondary | ICD-10-CM | POA: Diagnosis not present

## 2017-12-15 DIAGNOSIS — C50412 Malignant neoplasm of upper-outer quadrant of left female breast: Secondary | ICD-10-CM

## 2017-12-15 DIAGNOSIS — Z853 Personal history of malignant neoplasm of breast: Secondary | ICD-10-CM | POA: Diagnosis not present

## 2017-12-15 HISTORY — DX: Personal history of irradiation: Z92.3

## 2017-12-27 ENCOUNTER — Telehealth: Payer: Self-pay | Admitting: Hematology

## 2017-12-27 ENCOUNTER — Telehealth: Payer: Self-pay

## 2017-12-27 NOTE — Telephone Encounter (Signed)
Unable to reach patient per 10/29 sch message - left message for patient to call back to r/s

## 2017-12-27 NOTE — Telephone Encounter (Signed)
YF PAL 11/4 - moved appointments to 11/5. Left message for patient. Schedule mailed.

## 2017-12-27 NOTE — Telephone Encounter (Signed)
Patient called stating unable to come for appointments on 11/5 wants to reschedule for 11/11 so her husband can come.  Scheduling message was sent.

## 2017-12-28 ENCOUNTER — Telehealth: Payer: Self-pay | Admitting: Hematology

## 2017-12-28 NOTE — Telephone Encounter (Signed)
R/s appt per 10/30 sch message - pt is aware of new appt /

## 2018-01-02 ENCOUNTER — Ambulatory Visit: Payer: Medicare Other | Admitting: Hematology

## 2018-01-02 ENCOUNTER — Other Ambulatory Visit: Payer: Medicare Other

## 2018-01-03 ENCOUNTER — Inpatient Hospital Stay: Payer: Medicare Other

## 2018-01-03 ENCOUNTER — Inpatient Hospital Stay: Payer: Medicare Other | Admitting: Hematology

## 2018-01-04 ENCOUNTER — Ambulatory Visit (INDEPENDENT_AMBULATORY_CARE_PROVIDER_SITE_OTHER): Payer: Medicare Other | Admitting: Family Medicine

## 2018-01-04 ENCOUNTER — Encounter: Payer: Self-pay | Admitting: Family Medicine

## 2018-01-04 VITALS — BP 125/83 | HR 77 | Temp 98.2°F | Resp 16 | Ht 67.0 in | Wt 232.0 lb

## 2018-01-04 DIAGNOSIS — Z1159 Encounter for screening for other viral diseases: Secondary | ICD-10-CM | POA: Diagnosis not present

## 2018-01-04 DIAGNOSIS — Z0000 Encounter for general adult medical examination without abnormal findings: Secondary | ICD-10-CM

## 2018-01-04 DIAGNOSIS — E559 Vitamin D deficiency, unspecified: Secondary | ICD-10-CM

## 2018-01-04 DIAGNOSIS — N183 Chronic kidney disease, stage 3 unspecified: Secondary | ICD-10-CM

## 2018-01-04 DIAGNOSIS — I10 Essential (primary) hypertension: Secondary | ICD-10-CM

## 2018-01-04 DIAGNOSIS — E0590 Thyrotoxicosis, unspecified without thyrotoxic crisis or storm: Secondary | ICD-10-CM | POA: Diagnosis not present

## 2018-01-04 DIAGNOSIS — A6000 Herpesviral infection of urogenital system, unspecified: Secondary | ICD-10-CM

## 2018-01-04 DIAGNOSIS — Z9189 Other specified personal risk factors, not elsewhere classified: Secondary | ICD-10-CM

## 2018-01-04 LAB — CBC
HCT: 40.8 % (ref 36.0–46.0)
HEMOGLOBIN: 13.8 g/dL (ref 12.0–15.0)
MCHC: 33.7 g/dL (ref 30.0–36.0)
MCV: 95.1 fl (ref 78.0–100.0)
Platelets: 103 10*3/uL — ABNORMAL LOW (ref 150.0–400.0)
RBC: 4.29 Mil/uL (ref 3.87–5.11)
RDW: 14.5 % (ref 11.5–15.5)
WBC: 5 10*3/uL (ref 4.0–10.5)

## 2018-01-04 LAB — MICROALBUMIN / CREATININE URINE RATIO
Creatinine,U: 158.3 mg/dL
Microalb Creat Ratio: 1.2 mg/g (ref 0.0–30.0)
Microalb, Ur: 1.9 mg/dL (ref 0.0–1.9)

## 2018-01-04 LAB — BASIC METABOLIC PANEL
BUN: 17 mg/dL (ref 6–23)
CALCIUM: 10 mg/dL (ref 8.4–10.5)
CO2: 26 meq/L (ref 19–32)
Chloride: 107 mEq/L (ref 96–112)
Creatinine, Ser: 1.19 mg/dL (ref 0.40–1.20)
GFR: 57.35 mL/min — AB (ref >60.00)
GLUCOSE: 89 mg/dL (ref 70–99)
POTASSIUM: 4.8 meq/L (ref 3.5–5.1)
SODIUM: 139 meq/L (ref 135–145)

## 2018-01-04 LAB — VITAMIN D 25 HYDROXY (VIT D DEFICIENCY, FRACTURES): VITD: 36.44 ng/mL (ref 30.00–100.00)

## 2018-01-04 LAB — TSH: TSH: 2.39 u[IU]/mL (ref 0.35–4.50)

## 2018-01-04 MED ORDER — METHIMAZOLE 5 MG PO TABS: ORAL_TABLET | ORAL | 2 refills | Status: DC

## 2018-01-04 MED ORDER — VALACYCLOVIR HCL 500 MG PO TABS: 500.0000 mg | ORAL_TABLET | Freq: Two times a day (BID) | ORAL | 1 refills | Status: DC

## 2018-01-04 NOTE — Assessment & Plan Note (Signed)
Recommend avoiding NSAIDs. Continue low-salt diet. Adequate hydration. No changes in losartan 50 mg daily.

## 2018-01-04 NOTE — Progress Notes (Signed)
HPI:   Danielle [name] is a 72 y.o. female, who is here today for her routine physical.  Last CPE: She is not sure. Last AWV 05/2017.  Regular exercise 3 or more time per week: Daily stretching exercises. Following a healthy diet: Has not been consistent. She lives with her husband.  Chronic medical problems: Hypertension, hyperlipidemia, osteoporosis, generalized OA,unstable gait, hypothyroidism, vitamin D deficiency, CKD 3, left breast cancer (s/p lumpectomy), valvular disease, and atrial fibrillation among some. She is on chronic anticoagulation,follows with coumadin clinic.  She follows periodically with oncologist, Dr. Annamaria Boots.    Immunization History  Administered Date(s) Administered  . Influenza, High Dose Seasonal PF 11/24/2016, 12/08/2017  . Influenza-Unspecified 12/07/2015  . Pneumococcal Conjugate-13 11/21/2014  . Pneumococcal Polysaccharide-23 05/20/2016  . Zoster 03/01/2014    Mammogram: 11/2017 Bi-Rads 2 Colonoscopy: Had it done in [place], negative. Due in 2023 DEXA: 2 years ago,osteoporosis.   Hep C screening: Has not been done in the past.  She has no concerns today.  CKD III: She has not noted gross hematuria,foam in urine,or decreased urine output.  HTN on Cozaar 50 mg,Coreg 25 mg bid. + CHF.  She takes Furosemide 20 mg daily.  Lab Results  Component Value Date   WBC 5.1 07/04/2017   HGB 12.3 07/04/2017   HCT 38.1 07/04/2017   MCV 97.4 07/04/2017   PLT 101 (L) 07/04/2017     Hypothyroidism: Tolerating medication well, no side effects reported. She has not noted dysphagia, palpitations, abdominal pain, changes in bowel habits, tremor, cold/heat intolerance, or abnormal weight loss.  Lab Results  Component Value Date   TSH 1.96 09/28/2017    Vitamin D deficiency: Currently she is on OTC vitamin D 400 units daily. Osteoporosis, she has taken Fosamax weekly for 2 years. She is tolerating medication well, denies side  effects.  Chronic pain,she follows with pain management. She would like a Rx for Naproxen. She is on Tramadol and Acetaminophen.   Recurrent herpes, genital. She is on Valtrex as needed. She has been under more stress due to sister illness.   Review of Systems  Constitutional: Positive for fatigue. Negative for appetite change and fever.  HENT: Negative for dental problem, hearing loss, mouth sores, sore throat, trouble swallowing and voice change.   Eyes: Negative for redness and visual disturbance.  Respiratory: Negative for cough, shortness of breath and wheezing.   Cardiovascular: Negative for chest pain and leg swelling.  Gastrointestinal: Negative for abdominal pain, nausea and vomiting.       No changes in bowel habits.  Endocrine: Negative for cold intolerance, heat intolerance, polydipsia, polyphagia and polyuria.  Genitourinary: Negative for decreased urine volume, dysuria, hematuria, vaginal bleeding and vaginal discharge.  Musculoskeletal: Positive for arthralgias, back pain and gait problem.  Skin: Negative for rash and wound.  Allergic/Immunologic: Positive for environmental allergies.  Neurological: Negative for syncope, weakness and headaches.  Hematological: Negative for adenopathy. Does not bruise/bleed easily.  Psychiatric/Behavioral: Negative for confusion and sleep disturbance. The patient is nervous/anxious.   All other systems reviewed and are negative.     Current Outpatient Medications on File Prior to Visit  Medication Sig Dispense Refill  . acetaminophen (TYLENOL) 500 MG tablet Take 1,000 mg by mouth daily as needed (PAIN).    Marland Kitchen albuterol (PROVENTIL HFA;VENTOLIN HFA) 108 (90 Base) MCG/ACT inhaler Inhale into the lungs.    Marland Kitchen alendronate (FOSAMAX) 70 MG tablet Take 1 tablet (70 mg total) by mouth every Saturday. with a full  glass of water on an empty stomach. 13 tablet 3  . aspirin EC 81 MG tablet Take 81 mg daily by mouth.    . budesonide-formoterol  (SYMBICORT) 160-4.5 MCG/ACT inhaler Inhale 2 puffs into the lungs 2 (two) times daily. 1 Inhaler 0  . carvedilol (COREG) 25 MG tablet Take 1 tablet (25 mg total) by mouth 2 (two) times daily. 180 tablet 2  . Cholecalciferol (VITAMIN D3) 400 units tablet Take 1 tablet (400 Units total) by mouth daily. 30 tablet 0  . diclofenac sodium (VOLTAREN) 1 % GEL Apply 4 g topically 4 (four) times daily. 500 g 3  . diphenhydramine-acetaminophen (TYLENOL PM) 25-500 MG TABS tablet Take 2 tablets by mouth at bedtime as needed (SLEEP).     . enoxaparin (LOVENOX) 150 MG/ML injection Inject 1 mL (150 mg total) into the skin daily. 7 Syringe 0  . furosemide (LASIX) 20 MG tablet Take 1 tablet (20 mg total) by mouth daily. 90 tablet 2  . gabapentin (NEURONTIN) 300 MG capsule Take 1 capsule (300 mg total) by mouth 3 (three) times daily. 90 capsule 1  . losartan (COZAAR) 50 MG tablet Take 1 tablet by mouth daily 90 tablet 3  . ranitidine (ZANTAC) 150 MG tablet Take 1 tablet (150 mg total) by mouth 2 (two) times daily. 180 tablet 3  . rosuvastatin (CRESTOR) 40 MG tablet Take 1 tablet (40 mg total) by mouth daily. 90 tablet 3  . senna-docusate (SENOKOT-S) 8.6-50 MG tablet Take 2 tablets at bedtime by mouth.     . tamoxifen (NOLVADEX) 20 MG tablet Take 1 tablet (20 mg total) by mouth daily. 90 tablet 3  . traMADol (ULTRAM) 50 MG tablet Take 1 tablet (50 mg total) by mouth 3 (three) times daily. 90 tablet 5  . warfarin (COUMADIN) 3 MG tablet Take 1 tablet daily except 1 1/2 tablets on Wed. Or Take as directed by anticoagulation clinic. 35 tablet 3   No current facility-administered medications on file prior to visit.      Past Medical History:  Diagnosis Date  . Allergy   . Anxiety   . Aortic stenosis    Status post bioprosthetic AVR  . Arthritis    DJD  . Asthma   . Breast cancer ([place]) 12/13/2016   Left breast  . Chronic systolic CHF (congestive heart failure) ([place])    EF 30% 04/2014  . COPD (chronic  obstructive pulmonary disease) ([place])   . Coronary artery disease    per pt, had LHC prior to AVR in [place] that did not show any blockages; no stents/bypass  . Gastritis   . Hyperlipidemia   . Hypertension   . Hyperthyroidism   . Multiple thyroid nodules   . Osteoporosis   . Paroxysmal atrial fibrillation ([facility])   . Pelvis fracture ([place]) 08/19/2015   MULTIPLE   . Personal history of radiation therapy 2018  . Spontaneous pneumothorax 11/28/2015   left   . Stroke [facility]) 2000   rt hand weak    Past Surgical History:  Procedure Laterality Date  . BREAST LUMPECTOMY Left 12/2016  . BREAST LUMPECTOMY WITH RADIOACTIVE SEED AND SENTINEL LYMPH NODE BIOPSY Left 01/14/2017   Procedure: LEFT BREAST LUMPECTOMY WITH RADIOACTIVE SEED AND SENTINEL LYMPH NODE BIOPSY;  Surgeon: Rolm Bookbinder, MD;  Location: [facility];  Service: General;  Laterality: Left;  . CHEST TUBE INSERTION  10/2015  . INTRAMEDULLARY (IM) NAIL INTERTROCHANTERIC Left 12/10/2015   Procedure: INTRAMEDULLARY (IM) NAIL INTERTROCHANTRIC;  Surgeon: Marylynn Pearson  Erlinda Hong, MD;  Location: [facility];  Service: Orthopedics;  Laterality: Left;  . PLEURADESIS Left 12/03/2015   Procedure: PLEURADESIS;  Surgeon: Melrose Nakayama, MD;  Location: [facility];  Service: Thoracic;  Laterality: Left;  . RESECTION OF APICAL BLEB Left 12/03/2015   Procedure: BLEBECTOMY;  Surgeon: Melrose Nakayama, MD;  Location: [facility];  Service: Thoracic;  Laterality: Left;  . THORACOSCOPY  12/03/2015  . VALVE REPLACEMENT    . VIDEO ASSISTED THORACOSCOPY Left 12/03/2015   Procedure: VIDEO ASSISTED THORACOSCOPY;  Surgeon: Melrose Nakayama, MD;  Location: [facility];  Service: Thoracic;  Laterality: Left;    Allergies  Allergen Reactions  . Lisinopril Cough  . Tetanus Toxoid Adsorbed Swelling    Arm swelling    Family History  Problem Relation Age of Onset  . Diabetes Mother   . Heart attack Mother 66  . Diabetes Father   . Lung cancer Father   . Diabetes Sister     . Thyroid disease Sister   . Diabetes Sister   . HIV Brother     Social History   Socioeconomic History  . Marital status: Married    Spouse name: Sherwood  . Number of children: 0  . Years of education: Not on file  . Highest education level: Not on file  Occupational History  . Occupation: Retired in 2004  Social Needs  . Financial resource strain: Not on file  . Food insecurity:    Worry: Not on file    Inability: Not on file  . Transportation needs:    Medical: Not on file    Non-medical: Not on file  Tobacco Use  . Smoking status: Former Smoker    Packs/day: 0.25    Years: 10.00    Pack years: 2.50    Types: Cigarettes    Last attempt to quit: 03/01/2010    Years since quitting: 7.8  . Smokeless tobacco: Never Used  Substance and Sexual Activity  . Alcohol use: No  . Drug use: No  . Sexual activity: Not on file  Lifestyle  . Physical activity:    Days per week: Not on file    Minutes per session: Not on file  . Stress: Not on file  Relationships  . Social connections:    Talks on phone: Not on file    Gets together: Not on file    Attends religious service: Not on file    Active member of club or organization: Not on file    Attends meetings of clubs or organizations: Not on file    Relationship status: Not on file  Other Topics Concern  . Not on file  Social History Narrative   Lives with husband.  Ambulated independently.     Vitals:   01/04/18 1129  BP: 125/83  Pulse: 77  Resp: 16  Temp: 98.2 F (36.8 C)  SpO2: 97%   Body mass index is 36.34 kg/m.   Wt Readings from Last 3 Encounters:  01/04/18 232 lb (105.2 kg)  12/08/17 244 lb (110.7 kg)  10/17/17 238 lb (108 kg)      Physical Exam  Nursing note and vitals reviewed. Constitutional: She is oriented to person, place, and time. She appears well-developed. No distress.  HENT:  Head: Normocephalic and atraumatic.  Right Ear: Hearing, tympanic membrane, external ear and ear canal  normal.  Left Ear: Hearing, tympanic membrane, external ear and ear canal normal.  Mouth/Throat: Uvula is midline, oropharynx is clear and moist and  mucous membranes are normal. She has dentures.  Eyes: Pupils are equal, round, and reactive to light. Conjunctivae and EOM are normal.  Neck: No tracheal deviation present. Thyromegaly present.  Cardiovascular: Normal rate and regular rhythm.  Murmur heard. Pulses:      Dorsalis pedis pulses are 2+ on the right side, and 2+ on the left side.  Respiratory: Effort normal and breath sounds normal. No respiratory distress.  GI: Soft. She exhibits no mass. There is no hepatomegaly. There is no tenderness.  Musculoskeletal: She exhibits edema (Trace pitting LE edema,bilateral.).  No signs of synovitis appreciated. Antalgic gait.  Lymphadenopathy:    She has no cervical adenopathy.       Right: No supraclavicular adenopathy present.       Left: No supraclavicular adenopathy present.  Neurological: She is alert and oriented to person, place, and time. She has normal strength. No cranial nerve deficit. Gait abnormal.  Reflex Scores:      Bicep reflexes are 2+ on the right side and 2+ on the left side.      Patellar reflexes are 2+ on the right side and 2+ on the left side. Be set up and patellar DTRs 1-2+, symmetric bilateral. Unstable gait assisted with a walker.  Skin: Skin is warm. No rash noted. No erythema.  Psychiatric: Her mood appears anxious.  Well groomed, good eye contact.      ASSESSMENT AND PLAN:  [name] was here today annual physical examination.   Orders Placed This Encounter  Procedures  . Microalbumin / creatinine urine ratio  . VITAMIN D 25 Hydroxy (Vit-D Deficiency, Fractures)  . Basic metabolic panel  . CBC  . TSH  . Hepatitis C antibody screen   Lab Results  Component Value Date   CREATININE 1.19 01/04/2018   BUN 17 01/04/2018   NA 139 01/04/2018   K 4.8 01/04/2018   CL 107  01/04/2018   CO2 26 01/04/2018   Lab Results  Component Value Date   WBC 5.0 01/04/2018   HGB 13.8 01/04/2018   HCT 40.8 01/04/2018   MCV 95.1 01/04/2018   PLT 103.0 (L) 01/04/2018   Lab Results  Component Value Date   CREATININE 1.19 01/04/2018   BUN 17 01/04/2018   NA 139 01/04/2018   K 4.8 01/04/2018   CL 107 01/04/2018   CO2 26 01/04/2018   Lab Results  Component Value Date   MICROALBUR 1.9 01/04/2018    Routine general medical examination at a health care facility We discussed the importance of regular physical activity and healthy diet for prevention of chronic illness and/or complications. Preventive guidelines reviewed. Vaccination up-to-date.  Ca++ 1000-1200 mg ideally through her diet and vit D supplementation to continue. Next CPE in a year.  Hyperthyroidism No changes in current management, will follow labs done today and will give further recommendations accordingly.  -     TSH -     methimazole (TAPAZOLE) 5 MG tablet; TAKE 3 TABLETS BY MOUTH EVERY EVENING.   Encounter for HCV screening test for high risk patient -     Hepatitis C antibody screen  Vitamin D deficiency No changes in current management. Further recommendations will be given according to 25 OH vitamin D results.  Essential hypertension Adequately BP control. No changes in current management. Continue low-salt diet. Periodic eye exam recommended. Follow-up in 5 months.  CKD (chronic kidney disease), stage III ([place]) Recommend avoiding NSAIDs. Continue low-salt diet. Adequate hydration. No changes in losartan 50 mg  daily.   Recurrent genital herpes She is having more outbreaks lately attributed to stress. No changes in current management. Follow-up as needed.       Return in 5 months (on 06/05/2018) for HTN,hyperthyroidism.           G. [name], MD  [facility]. [place] office.

## 2018-01-04 NOTE — Assessment & Plan Note (Signed)
She is having more outbreaks lately attributed to stress. No changes in current management. Follow-up as needed.

## 2018-01-04 NOTE — Assessment & Plan Note (Signed)
Adequately BP control. No changes in current management. Continue low-salt diet. Periodic eye exam recommended. Follow-up in 5 months.

## 2018-01-04 NOTE — Assessment & Plan Note (Signed)
No changes in current management. Further recommendations will be given according to 25 OH vitamin D results.

## 2018-01-04 NOTE — Patient Instructions (Addendum)
A few things to remember from today's visit:   Vitamin D deficiency - Plan: VITAMIN D 25 Hydroxy (Vit-D Deficiency, Fractures)  Hyperthyroidism  Essential hypertension  CKD (chronic kidney disease), stage III ([place]) - Plan: Microalbumin / creatinine urine ratio  Encounter for HCV screening test for high risk patient  A few tips:  -As we age balance is not as good as it was, so there is a higher risks for falls. Please remove small rugs and furniture that is "in your way" and could increase the risk of falls. Stretching exercises may help with fall prevention: Yoga and Tai Chi are some examples. Low impact exercise is better, so you are not very achy the next day.  -Sun screen and avoidance of direct sun light recommended. Caution with dehydration, if working outdoors be sure to drink enough fluids.  - Some medications are not safe as we age, increases the risk of side effects and can potentially interact with other medication you are also taken;  including some of over the counter medications. Be sure to let me know when you start a new medication even if it is a dietary/vitamin supplement.   -Healthy diet low in red meet/animal fat and sugar + regular physical activity is recommended.      Please be sure medication list is accurate. If a new problem present, please set up appointment sooner than planned today.

## 2018-01-05 LAB — HEPATITIS C ANTIBODY
Hepatitis C Ab: NONREACTIVE
SIGNAL TO CUT-OFF: 0.03 (ref <1.00)

## 2018-01-11 ENCOUNTER — Ambulatory Visit: Payer: Medicare Other

## 2018-01-11 ENCOUNTER — Ambulatory Visit (INDEPENDENT_AMBULATORY_CARE_PROVIDER_SITE_OTHER): Payer: Medicare Other | Admitting: General Practice

## 2018-01-11 DIAGNOSIS — Z7901 Long term (current) use of anticoagulants: Secondary | ICD-10-CM | POA: Diagnosis not present

## 2018-01-11 LAB — POCT INR: INR: 2.6 (ref 2.0–3.0)

## 2018-01-11 NOTE — Patient Instructions (Signed)
Pre visit review using our clinic review tool, if applicable. No additional management support is needed unless otherwise documented below in the visit note. ° °Continue to take 1 tablet daily except 1 1/2 tablets on Wednesdays. Re-check in 4 weeks. °

## 2018-01-17 ENCOUNTER — Telehealth: Payer: Self-pay | Admitting: Family Medicine

## 2018-01-17 ENCOUNTER — Other Ambulatory Visit: Payer: Self-pay | Admitting: *Deleted

## 2018-01-17 ENCOUNTER — Other Ambulatory Visit: Payer: Self-pay | Admitting: Family Medicine

## 2018-01-17 DIAGNOSIS — A6000 Herpesviral infection of urogenital system, unspecified: Secondary | ICD-10-CM

## 2018-01-17 MED ORDER — VALACYCLOVIR HCL 500 MG PO TABS: 500.0000 mg | ORAL_TABLET | Freq: Two times a day (BID) | ORAL | 1 refills | Status: DC

## 2018-01-17 NOTE — Progress Notes (Signed)
[place]  [phone] [phone]  Clinic Follow Up Note   Patient Care Team: [name], [name], MD as PCP - General (Family Medicine) End, Harrell Gave, MD as PCP - Cardiology (Cardiology) [name], MD as Consulting Physician (Hematology) Kyung Rudd, MD as Consulting Physician (Radiation Oncology) Rolm Bookbinder, MD as Consulting Physician (General Surgery) Gardenia Phlegm, NP as Nurse Practitioner (Hematology and Oncology) 01/19/2018  CHIEF COMPLAINTS:  Malignant neoplasm of upper-outer quadrant of left breast in female, estrogen receptor positive   Oncology History   Cancer Staging Malignant neoplasm of upper-outer quadrant of left breast in female, estrogen receptor positive ([place]) Staging form: Breast, AJCC 8th Edition - Clinical stage from 12/13/2016: Stage IB (cT2, cN0, cM0, G2, ER: Positive, PR: Positive, HER2: Negative) - Signed by [name], MD on 12/21/2016 - Pathologic stage from 01/14/2017: Stage IA (pT2, pN0, cM0, G1, ER: Positive, PR: Positive, HER2: Negative, Oncotype DX score: 4) - Signed by [name], MD on 04/17/2017       Malignant neoplasm of upper-outer quadrant of left breast in female, estrogen receptor positive ([place])   12/06/2016 Mammogram    Diagnostic Mammogram 12/06/16 IMPRESSION:  Suspicious mass in the left breast at 3 o'clock 2 cm from the nipple measuring 1.9 x 1.1 x 2.2 cm. RECOMMENDATION: Ultrasound-guided core biopsy of the mass in the 3 o'clock region of the left breast is recommended. The biopsy will be scheduled at the patient's convenience.    12/13/2016 Initial Biopsy    Diagnosis 12/13/16 Breast, left, needle core biopsy, 3:00 o'clock, 2cmfn - INVASIVE DUCTAL CARCINOMA - SEE COMMENT    12/16/2016 Initial Diagnosis    Malignant neoplasm of upper-outer quadrant of left breast in female, estrogen receptor positive ([place])    12/17/2016 Receptors her2    Estrogen Receptor: 100%,  POSITIVE, STRONG STAINING INTENSITY Progesterone Receptor: 100%, POSITIVE, STRONG STAINING INTENSITY Proliferation Marker Ki67: 30% HER2 - NEGATIVE     01/14/2017 Surgery    Left breast lumpectomy with Dr. Donne Hazel    01/14/2017 Pathology Results    Diagnosis 01/14/17 1. Breast, lumpectomy, Left - INVASIVE DUCTAL CARCINOMA, GRADE I/III, SPANNING 2.1 CM. - DUCTAL CARCINOMA IN SITU, LOW GRADE. - INVASIVE CARCINOMA IS BROADLY PRESENT AT THE INFERIOR MARGIN OF SPECIMEN 1. - DUCTAL CARCINOMA IN SITU IS FOCALLY PRESENT AT THE INFERIOR MARGIN OF SPECIMEN 1 AND BROADLY LESS THAN 0.1 CM TO THE LATERAL MARGIN OF SPECIMEN 1. - SEE ONCOLOGY TABLE BELOW. 2. Breast, excision, Additional medial margin left - DUCTAL CARCINOMA IN SITU, LOW GRADE. - DUCTAL CARCINOMA IS FOCALLY LESS THAN 0.1 CM TO THE NEW MARGIN OF SPECIMEN 2. 3. Breast, excision, Additional lateral margin left - DUCTAL CARCINOMA IN SITU, LOW GRADE. - DUCTAL CARCINOMA IN SITU IS GREATER THAN 0.2 CM TO ALL MARGINS. 4. Breast, excision, Additional superior margin left - DUCTAL CARCINOMA IN SITU, LOW GRADE. - DUCTAL CARCINOMA IN SITU IS BROADLY LESS THAN 0.1 CM TO THE NEW MARGIN OF SPECIMEN 4. 5. Lymph node, sentinel, biopsy, Left axillary - THERE IS NO EVIDENCE OF CARCINOMA IN 1 OF 1 LYMPH NODE (0/1). 6. Breast, excision, Additional inferior margin left - DUCTAL CARCINOMA IN SITU, LOW GRADE. - DUCTAL CARCINOMA IN SITU IS GREATER THAN 0.2 CM TO ALL MARGINS.     01/14/2017 Oncotype testing    Her oncotype recurrence score is 4 and her distance recurrent on Tamoxifen alone is 3%.    03/08/2017 - 04/05/2017 Radiation Therapy    RT with Dr. Lisbeth Renshaw  06/2017 -  Anti-estrogen oral therapy    Tamoxifen daily    12/15/2017 Mammogram    12/15/2017 Mammogram IMPRESSION: New lumpectomy site left breast. No mammographic evidence of malignancy in either breast.     HISTORY OF PRESENTING ILLNESS: 12/22/16 Danielle [name] 72  y.o. female is here because of newly diagnosed left breast cancer. She presents to breast clinic today with. Her best friend and husband.   In the past she had left hip surgery and had a rod placed last year. She did PT but will go back due to her trouble walking. She has arthritis and diagnosed with osteoporosis. She has been on Fosamax for the last 2 years. She does have back pain and knee pain arthritis. She had a stoke in 2000, she has CAD and Afib. She had open heart surgery and experienced another stroke during operation. She does experience SOB when she lays flat. She does have COPD and uses inhaler. Her mother, father had lung cancer and 2 sisters had cancer. She quit smoking in 2012 and had smoked on and off for 10 years.   Today she notes having left breast pain initially that prompted the mammogram. She also had nipple soreness but no discharge or other breast change.  She ambulates with a walker from hip surgery. She is able to take care of herself at home but her husband helps her significantly at home. She had hot flashes during her menopause and now they are gone.    GYN HISTORY  Menarchal: 11 LMP: at 72 yo Contraceptive: no HRT: no GP: G0  CURRENT THERAPY: Adjuvant Tamoxifen 20 mg daily started on 04/20/17  INTERM HISTORY: Danielle [name] is here for follow up.   Today, she is here with her husband. She is doing well and denies new complaints. She is tolerating Tamoxifen well with fluctuating hot flashes that are overall tolerable. She denies bleeding. She would like to see an OB/GYN doctor. She still has intermittent sharp left breast pain at surgical site. She has chronic hip pain since her surgery, which significantly limits her mobility. She uses a walker or cane when she walks.  She does not plan to participate exercise program at the [facility] soon.   Pertinent positives and negatives are listed and detailed within the above HPI, review systems otherwise  negative   MEDICAL HISTORY:  Past Medical History:  Diagnosis Date  . Allergy   . Anxiety   . Aortic stenosis    Status post bioprosthetic AVR  . Arthritis    DJD  . Asthma   . Breast cancer ([place]) 12/13/2016   Left breast  . Chronic systolic CHF (congestive heart failure) ([place])    EF 30% 04/2014  . COPD (chronic obstructive pulmonary disease) ([place])   . Coronary artery disease    per pt, had LHC prior to AVR in [place] that did not show any blockages; no stents/bypass  . Gastritis   . Hyperlipidemia   . Hypertension   . Hyperthyroidism   . Multiple thyroid nodules   . Osteoporosis   . Paroxysmal atrial fibrillation ([facility])   . Pelvis fracture ([place]) 08/19/2015   MULTIPLE   . Personal history of radiation therapy 2018  . Spontaneous pneumothorax 11/28/2015   left   . Stroke ([place]) 2000   rt hand weak    SURGICAL HISTORY: Past Surgical History:  Procedure Laterality Date  . BREAST LUMPECTOMY Left 12/2016  . BREAST LUMPECTOMY WITH RADIOACTIVE SEED AND SENTINEL LYMPH NODE BIOPSY Left  01/14/2017   Procedure: LEFT BREAST LUMPECTOMY WITH RADIOACTIVE SEED AND SENTINEL LYMPH NODE BIOPSY;  Surgeon: Rolm Bookbinder, MD;  Location: [facility];  Service: General;  Laterality: Left;  . CHEST TUBE INSERTION  10/2015  . INTRAMEDULLARY (IM) NAIL INTERTROCHANTERIC Left 12/10/2015   Procedure: INTRAMEDULLARY (IM) NAIL INTERTROCHANTRIC;  Surgeon: Leandrew Koyanagi, MD;  Location: [facility];  Service: Orthopedics;  Laterality: Left;  . PLEURADESIS Left 12/03/2015   Procedure: PLEURADESIS;  Surgeon: Melrose Nakayama, MD;  Location: [facility];  Service: Thoracic;  Laterality: Left;  . RESECTION OF APICAL BLEB Left 12/03/2015   Procedure: BLEBECTOMY;  Surgeon: Melrose Nakayama, MD;  Location: [facility];  Service: Thoracic;  Laterality: Left;  . THORACOSCOPY  12/03/2015  . VALVE REPLACEMENT    . VIDEO ASSISTED THORACOSCOPY Left 12/03/2015   Procedure: VIDEO ASSISTED THORACOSCOPY;  Surgeon: Melrose Nakayama, MD;  Location: [facility];  Service: Thoracic;  Laterality: Left;    SOCIAL HISTORY: Social History   Socioeconomic History  . Marital status: Married    Spouse name: Sherwood  . Number of children: 0  . Years of education: Not on file  . Highest education level: Not on file  Occupational History  . Occupation: Retired in 2004  Social Needs  . Financial resource strain: Not on file  . Food insecurity:    Worry: Not on file    Inability: Not on file  . Transportation needs:    Medical: Not on file    Non-medical: Not on file  Tobacco Use  . Smoking status: Former Smoker    Packs/day: 0.25    Years: 10.00    Pack years: 2.50    Types: Cigarettes    Last attempt to quit: 03/01/2010    Years since quitting: 7.8  . Smokeless tobacco: Never Used  Substance and Sexual Activity  . Alcohol use: No  . Drug use: No  . Sexual activity: Not on file  Lifestyle  . Physical activity:    Days per week: Not on file    Minutes per session: Not on file  . Stress: Not on file  Relationships  . Social connections:    Talks on phone: Not on file    Gets together: Not on file    Attends religious service: Not on file    Active member of club or organization: Not on file    Attends meetings of clubs or organizations: Not on file    Relationship status: Not on file  . Intimate partner violence:    Fear of current or ex partner: Not on file    Emotionally abused: Not on file    Physically abused: Not on file    Forced sexual activity: Not on file  Other Topics Concern  . Not on file  Social History Narrative   Lives with husband.  Ambulated independently.    FAMILY HISTORY: Family History  Problem Relation Age of Onset  . Diabetes Mother   . Heart attack Mother 42  . Diabetes Father   . Lung cancer Father   . Diabetes Sister   . Thyroid disease Sister   . Diabetes Sister   . HIV Brother     ALLERGIES:  is allergic to lisinopril and tetanus toxoid  adsorbed.  MEDICATIONS:  Current Outpatient Medications  Medication Sig Dispense Refill  . acetaminophen (TYLENOL) 500 MG tablet Take 1,000 mg by mouth daily as needed (PAIN).    Marland Kitchen albuterol (PROVENTIL HFA;VENTOLIN HFA) 108 (90 Base)  MCG/ACT inhaler Inhale into the lungs.    Marland Kitchen alendronate (FOSAMAX) 70 MG tablet Take 1 tablet (70 mg total) by mouth every Saturday. with a full glass of water on an empty stomach. 13 tablet 3  . aspirin EC 81 MG tablet Take 81 mg daily by mouth.    . budesonide-formoterol (SYMBICORT) 160-4.5 MCG/ACT inhaler Inhale 2 puffs into the lungs 2 (two) times daily. 1 Inhaler 0  . carvedilol (COREG) 25 MG tablet Take 1 tablet (25 mg total) by mouth 2 (two) times daily. 180 tablet 2  . Cholecalciferol (VITAMIN D3) 400 units tablet Take 1 tablet (400 Units total) by mouth daily. 30 tablet 0  . diclofenac sodium (VOLTAREN) 1 % GEL Apply 4 g topically 4 (four) times daily. 500 g 3  . diphenhydramine-acetaminophen (TYLENOL PM) 25-500 MG TABS tablet Take 2 tablets by mouth at bedtime as needed (SLEEP).     . enoxaparin (LOVENOX) 150 MG/ML injection Inject 1 mL (150 mg total) into the skin daily. 7 Syringe 0  . furosemide (LASIX) 20 MG tablet Take 1 tablet (20 mg total) by mouth daily. 90 tablet 2  . gabapentin (NEURONTIN) 300 MG capsule Take 1 capsule (300 mg total) by mouth 3 (three) times daily. 90 capsule 1  . losartan (COZAAR) 50 MG tablet Take 1 tablet by mouth daily 90 tablet 3  . methimazole (TAPAZOLE) 5 MG tablet TAKE 3 TABLETS BY MOUTH EVERY EVENING. 180 tablet 2  . ranitidine (ZANTAC) 150 MG tablet Take 1 tablet (150 mg total) by mouth 2 (two) times daily. 180 tablet 3  . rosuvastatin (CRESTOR) 40 MG tablet Take 1 tablet (40 mg total) by mouth daily. 90 tablet 3  . senna-docusate (SENOKOT-S) 8.6-50 MG tablet Take 2 tablets at bedtime by mouth.     . tamoxifen (NOLVADEX) 20 MG tablet Take 1 tablet (20 mg total) by mouth daily. 90 tablet 3  . traMADol (ULTRAM) 50 MG  tablet Take 1 tablet (50 mg total) by mouth 3 (three) times daily. 90 tablet 5  . valACYclovir (VALTREX) 500 MG tablet Take 1 tablet (500 mg total) by mouth 2 (two) times daily. For 3 days when outbreaks. 24 tablet 1  . warfarin (COUMADIN) 3 MG tablet Take 1 tablet daily except 1 1/2 tablets on Wed. Or Take as directed by anticoagulation clinic. 35 tablet 3   No current facility-administered medications for this visit.     REVIEW OF SYSTEMS:   Constitutional: Denies fevers, chills or abnormal night sweats (+) hot flashes  Eyes: Denies blurriness of vision, double vision or watery eyes Ears, nose, mouth, throat, and face: Denies mucositis or sore throat Respiratory: Denies cough, dyspnea or wheezes Cardiovascular: Denies palpitation, chest discomfort or lower extremity swelling Gastrointestinal:  Denies nausea, heartburn or change in bowel habits Skin: Denies abnormal skin rashes Lymphatics: Denies new lymphadenopathy or easy bruising Neurological:Denies numbness, tingling or new weaknesses Behavioral/Psych: Mood is stable, no new changes  Breast: (+) sharp shooting pain in left breast at surgical site MSK:(+) chronic hip pain  All other systems were reviewed with the patient and are negative.    PHYSICAL EXAMINATION: ECOG PERFORMANCE STATUS: 2 - Symptomatic, <50% confined to bed  Vitals:   01/19/18 1410  BP: (!) 146/73  Pulse: 79  Resp: 17  Temp: 97.6 F (36.4 C)  SpO2: 96%   Filed Weights   01/19/18 1410  Weight: 244 lb 4.8 oz (110.8 kg)    GENERAL:alert, no distress and comfortable (+) on wheelchair  SKIN: skin color, texture, turgor are normal, no rashes or significant lesions  EYES: normal, conjunctiva are pink and non-injected, sclera clear OROPHARYNX:no exudate, no erythema and lips, buccal mucosa, and tongue normal  NECK: supple, thyroid normal size, non-tender, without nodularity LYMPH:  no palpable lymphadenopathy in the cervical, axillary or inguinal LUNGS:  clear to auscultation and percussion with normal breathing effort HEART: regular rate & rhythm and no murmurs and no lower extremity edema (+) midline sternal scar  ABDOMEN:abdomen soft, non-tender and normal bowel sounds Musculoskeletal:no cyanosis of digits and no clubbing (+) hip pain PSYCH: alert & oriented x 3 with fluent speech NEURO: no focal motor/sensory deficits Breast: (+) hyperpigmentation on left breast from radiation (+) left breast lymphedema, exam of the bilateral breast and axilla showed no palpable mass or adenopathy.  LABORATORY DATA:  I have reviewed the data as listed CBC Latest Ref Rng & Units 01/19/2018 01/04/2018 07/04/2017  WBC 4.0 - 10.5 K/uL 6.0 5.0 5.1  Hemoglobin 12.0 - 15.0 g/dL 12.8 13.8 12.3  Hematocrit 36.0 - 46.0 % 40.2 40.8 38.1  Platelets 150 - 400 K/uL 98(L) 103.0(L) 101(L)    CMP Latest Ref Rng & Units 01/19/2018 01/04/2018 09/28/2017  Glucose 70 - 99 mg/dL 115(H) 89 90  BUN 8 - 23 mg/dL _0 Creatinine 0.44 - 1.00 mg/dL 1.37(H) 1.19 1.17  Sodium 135 - 145 mmol/L 142 139 142  Potassium 3.5 - 5.1 mmol/L 5.0 4.8 5.0  Chloride 98 - 111 mmol/L 110 107 109  CO2 22 - 32 mmol/L _1 Calcium 8.9 - 10.3 mg/dL 9.9 10.0 9.7  Total Protein 6.5 - 8.1 g/dL 7.1 - -  Total Bilirubin 0.3 - 1.2 mg/dL 0.5 - -  Alkaline Phos 38 - 126 U/L 56 - -  AST 15 - 41 U/L 15 - -  ALT 0 - 44 U/L 15 - -    PATHOLOGY  Diagnosis 12/13/16 Breast, left, needle core biopsy, 3:00 o'clock, 2cmfn - INVASIVE DUCTAL CARCINOMA - SEE COMMENT Microscopic Comment Based on the biopsy, the carcinoma appears Nottingham grade 2 of 3 and measures 0.8 cm in greatest linear extent. Prognostic markers (ER/PR/ki-67/HER2-FISH) are pending and will be reported in an addendum. Dr. Saralyn Pilar has reviewed the case and agrees with above diagnosis. These results were called to [facility] on December 14, 2016. PROGNOSTIC INDICATORS Results: IMMUNOHISTOCHEMICAL AND  MORPHOMETRIC ANALYSIS PERFORMED MANUALLY Estrogen Receptor: 100%, POSITIVE, STRONG STAINING INTENSITY Progesterone Receptor: 100%, POSITIVE, STRONG STAINING INTENSITY Proliferation Marker Ki67: 30% FLUORESCENCE IN-SITU HYBRIDIZATION Results: HER2 - NEGATIVE RATIO OF HER2/CEP17 SIGNALS 1.11 AVERAGE HER2 COPY NUMBER PER CELL 1.95   Diagnosis 01/14/17 1. Breast, lumpectomy, Left - INVASIVE DUCTAL CARCINOMA, GRADE I/III, SPANNING 2.1 CM. - DUCTAL CARCINOMA IN SITU, LOW GRADE. - INVASIVE CARCINOMA IS BROADLY PRESENT AT THE INFERIOR MARGIN OF SPECIMEN 1. - DUCTAL CARCINOMA IN SITU IS FOCALLY PRESENT AT THE INFERIOR MARGIN OF SPECIMEN 1 AND BROADLY LESS THAN 0.1 CM TO THE LATERAL MARGIN OF SPECIMEN 1. - SEE ONCOLOGY TABLE BELOW. 2. Breast, excision, Additional medial margin left - DUCTAL CARCINOMA IN SITU, LOW GRADE. - DUCTAL CARCINOMA IS FOCALLY LESS THAN 0.1 CM TO THE NEW MARGIN OF SPECIMEN 2. 3. Breast, excision, Additional lateral margin left - DUCTAL CARCINOMA IN SITU, LOW GRADE. - DUCTAL CARCINOMA IN SITU IS GREATER THAN 0.2 CM TO ALL MARGINS. 4. Breast, excision, Additional superior margin left - DUCTAL CARCINOMA IN SITU, LOW GRADE. - DUCTAL CARCINOMA IN SITU  IS BROADLY LESS THAN 0.1 CM TO THE NEW MARGIN OF SPECIMEN 4. 5. Lymph node, sentinel, biopsy, Left axillary - THERE IS NO EVIDENCE OF CARCINOMA IN 1 OF 1 LYMPH NODE (0/1). 6. Breast, excision, Additional inferior margin left - DUCTAL CARCINOMA IN SITU, LOW GRADE. - DUCTAL CARCINOMA IN SITU IS GREATER THAN 0.2 CM TO ALL MARGINS. Microscopic Comment 1. BREAST, INVASIVE TUMOR Procedure: Seed localized lumpectomy with additional margin resections and axillary lymph node resection Laterality: Left Tumor Size: 2.1 cm (gross measurement) Histologic Type: Ductal Grade: I Tubular Differentiation: 2 Nuclear Pleomorphism: 2 Mitotic Count: 1 Ductal Carcinoma in Situ (DCIS): Preset, low grade, extensive Extent of Tumor: Confined  to breast parenchyma. : Margins: Invasive carcinoma, distance from closest margin: Greater than 0.2 cm to all final surgical margins DCIS, distance from closest margin: Ductal carcinoma in situ is focally less than 0.1 cm to the new medial margin (specimen 2) and broadly less than 0.1 cm to new superior margin (specimen 4). Regional Lymph Nodes: Number of Lymph Nodes Examined: 1 Number of Sentinel Lymph Nodes Examined: 1 Lymph Nodes with Macrometastases: 0 Lymph Nodes with Micrometastases: 0 Lymph Nodes with Isolated Tumor Cells: 0 Breast Prognostic Profile: Case [id number] Estrogen Receptor: 100%, strong Progesterone Receptor: 100%, strong Her2: No amplification was detached. The ratio was 1.11 Ki-67: 30% Best tumor block for sendout testing: 1A-1E Pathologic Stage Classification (pTNM, AJCC 8th Edition): Primary Tumor (pT): pT2 Regional Lymph Nodes (pN): pN0 Distant Metastases (pM): pMX (JBK:kh 01-18-17)     RADIOGRAPHIC STUDIES: I have personally reviewed the radiological images as listed and agreed with the findings in the report.  12/15/2017 Mammogram IMPRESSION: New lumpectomy site left breast. No mammographic evidence of malignancy in either breast.  Diagnostic mammogram and [place] 12/06/16 IMPRESSION: Suspicious left breast mass.   ASSESSMENT & PLAN:  Danielle [name] is a 72 y.o. African-american female with a history of Aortic Stenosis, Arthritis, Asthma/COPD, CAD, CHF history of Stroke, Osteoporosis, HTN, HLD, Hyperthyroidism, and Afib.   1. Malignant neoplasm of upper-outer quadrant of left breast, Stage IB, c(T2,N0,M0 ), ER/PR: POSITIVE, HER2: NEGATIVE, Grade II ---pt underwent a left breast lumpectomy with Dr. Donne Hazel on 01/14/17. 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 Her margins also contained DCIS. There was no evidence of carcinoma in 1/1/ lymph node biopsied. I discussed her surgical path result in details -the Oncotype Dx  result was previously reviewed with her in details. She has low risk based on the recurrence score, which predicts 10 year distant recurrence after 5 years of tamoxifen 3%.  There is no benefit of adjuvant chemotherapy for low risk disease. -she completed adjuvant RT with Dr. Lisbeth Renshaw from 03/08/17 - 04/05/17, tolerated well overall.  -She has started adjuvant tamoxifen, tolerating well, will continue for 5 to 10 years.  Due to her severe arthritis, she may not be able to tolerate aromatase inhibitor. -She is doing well. Labs reviewed with pt, CBC showed PLT 98K. Other wise normal. CMP showed Cr 1.37 BG 115.  -Mammogram in Oct 2019 was benign. -She is clinically doing well, exam was unremarkable, no clinical concern for recurrence. -Continue tamoxifen, plan for 5 years due to her age and medical comorbidities. -f/u in 6 months   2. Osteoporosis, Arthritis  -Underwent Left Hip surgery with rod placement in 2017, ambulates with walker -On Fosamax  -stable, she plan to participate exercise program   3. CAD, Afib, CHF, Aortic Stenosis, H/o Stroke  -On Coreg, Coumadin -clinically stable  -follow-up with her primary  care physician  PLAN:  -Continue Tamoxifen -Mammogram in 2020 -f/u in 6 months with labs  -OB/GYN referral per pt's request   Orders Placed This Encounter  Procedures  . Ambulatory referral to Gynecology    Referral Priority:   Routine    Referral Type:   Consultation    Referral Reason:   Specialty Services Required    Requested Specialty:   Gynecology    Number of Visits Requested:   1   All questions were answered. The patient knows to call the clinic with any problems, questions or concerns. I spent 15 minutes counseling the patient face to face. The total time spent in the appointment was 20 minutes and more than 50% was on counseling.  Dierdre Searles Dweik am acting as scribe for Dr. [name].  I have reviewed the above documentation for accuracy and completeness, and I agree  with the above.     [name], MD 01/19/2018 5:01 PM

## 2018-01-17 NOTE — Telephone Encounter (Signed)
Spoke to the [facility].  Pt did not receive her Valtrex.  Prescription was printed instead of sent.  I directed the prescription to the pharmacy.  No further action required.

## 2018-01-17 NOTE — Telephone Encounter (Signed)
Rx sent to pharmacy as requested.

## 2018-01-17 NOTE — Telephone Encounter (Signed)
Copied from [place] [id number]. Topic: Quick Communication - Rx Refill/Question >> Jan 17, 2018  9:32 AM Sheran Luz wrote: Medication: valACYclovir (VALTREX) 500 MG tablet   Patient is requesting a refill of this medication-Pharmacy states the last rx on file was 10/18 with 0 refills.    Has the patient contacted their pharmacy? Yes-Pharmacy states they have not received rx   Preferred Pharmacy (with phone number or street name): [place], [place] - [address] [phone] (Phone) [phone] (Fax)

## 2018-01-19 ENCOUNTER — Inpatient Hospital Stay: Payer: Medicare Other | Attending: Hematology

## 2018-01-19 ENCOUNTER — Encounter: Payer: Self-pay | Admitting: Hematology

## 2018-01-19 ENCOUNTER — Telehealth: Payer: Self-pay

## 2018-01-19 ENCOUNTER — Inpatient Hospital Stay (HOSPITAL_BASED_OUTPATIENT_CLINIC_OR_DEPARTMENT_OTHER): Payer: Medicare Other | Admitting: Hematology

## 2018-01-19 VITALS — BP 146/73 | HR 79 | Temp 97.6°F | Resp 17 | Ht 67.0 in | Wt 244.3 lb

## 2018-01-19 DIAGNOSIS — Z923 Personal history of irradiation: Secondary | ICD-10-CM

## 2018-01-19 DIAGNOSIS — C50412 Malignant neoplasm of upper-outer quadrant of left female breast: Secondary | ICD-10-CM | POA: Diagnosis not present

## 2018-01-19 DIAGNOSIS — E559 Vitamin D deficiency, unspecified: Secondary | ICD-10-CM

## 2018-01-19 DIAGNOSIS — I5022 Chronic systolic (congestive) heart failure: Secondary | ICD-10-CM

## 2018-01-19 DIAGNOSIS — I480 Paroxysmal atrial fibrillation: Secondary | ICD-10-CM | POA: Insufficient documentation

## 2018-01-19 DIAGNOSIS — Z79810 Long term (current) use of selective estrogen receptor modulators (SERMs): Secondary | ICD-10-CM

## 2018-01-19 DIAGNOSIS — Z9221 Personal history of antineoplastic chemotherapy: Secondary | ICD-10-CM | POA: Insufficient documentation

## 2018-01-19 DIAGNOSIS — E785 Hyperlipidemia, unspecified: Secondary | ICD-10-CM | POA: Diagnosis not present

## 2018-01-19 DIAGNOSIS — Z79899 Other long term (current) drug therapy: Secondary | ICD-10-CM | POA: Diagnosis not present

## 2018-01-19 DIAGNOSIS — Z7982 Long term (current) use of aspirin: Secondary | ICD-10-CM | POA: Diagnosis not present

## 2018-01-19 DIAGNOSIS — M810 Age-related osteoporosis without current pathological fracture: Secondary | ICD-10-CM | POA: Insufficient documentation

## 2018-01-19 DIAGNOSIS — I2510 Atherosclerotic heart disease of native coronary artery without angina pectoris: Secondary | ICD-10-CM | POA: Diagnosis not present

## 2018-01-19 DIAGNOSIS — Z7901 Long term (current) use of anticoagulants: Secondary | ICD-10-CM | POA: Diagnosis not present

## 2018-01-19 DIAGNOSIS — J449 Chronic obstructive pulmonary disease, unspecified: Secondary | ICD-10-CM | POA: Insufficient documentation

## 2018-01-19 DIAGNOSIS — M129 Arthropathy, unspecified: Secondary | ICD-10-CM

## 2018-01-19 DIAGNOSIS — Z801 Family history of malignant neoplasm of trachea, bronchus and lung: Secondary | ICD-10-CM | POA: Insufficient documentation

## 2018-01-19 DIAGNOSIS — Z87891 Personal history of nicotine dependence: Secondary | ICD-10-CM | POA: Diagnosis not present

## 2018-01-19 DIAGNOSIS — I350 Nonrheumatic aortic (valve) stenosis: Secondary | ICD-10-CM | POA: Insufficient documentation

## 2018-01-19 DIAGNOSIS — M1990 Unspecified osteoarthritis, unspecified site: Secondary | ICD-10-CM | POA: Diagnosis not present

## 2018-01-19 DIAGNOSIS — Z170 Estrogen receptor positive status [ER+]: Secondary | ICD-10-CM

## 2018-01-19 DIAGNOSIS — I110 Hypertensive heart disease with heart failure: Secondary | ICD-10-CM

## 2018-01-19 DIAGNOSIS — F419 Anxiety disorder, unspecified: Secondary | ICD-10-CM

## 2018-01-19 DIAGNOSIS — Z8673 Personal history of transient ischemic attack (TIA), and cerebral infarction without residual deficits: Secondary | ICD-10-CM | POA: Insufficient documentation

## 2018-01-19 LAB — CBC WITH DIFFERENTIAL (CANCER CENTER ONLY)
Abs Immature Granulocytes: 0.02 10*3/uL (ref 0.00–0.07)
BASOS ABS: 0 10*3/uL (ref 0.0–0.1)
Basophils Relative: 1 %
Eosinophils Absolute: 0.4 10*3/uL (ref 0.0–0.5)
Eosinophils Relative: 7 %
HEMATOCRIT: 40.2 % (ref 36.0–46.0)
HEMOGLOBIN: 12.8 g/dL (ref 12.0–15.0)
IMMATURE GRANULOCYTES: 0 %
LYMPHS ABS: 1.2 10*3/uL (ref 0.7–4.0)
Lymphocytes Relative: 20 %
MCH: 30.5 pg (ref 26.0–34.0)
MCHC: 31.8 g/dL (ref 30.0–36.0)
MCV: 95.9 fL (ref 80.0–100.0)
Monocytes Absolute: 0.6 10*3/uL (ref 0.1–1.0)
Monocytes Relative: 9 %
NEUTROS ABS: 3.8 10*3/uL (ref 1.7–7.7)
NEUTROS PCT: 63 %
NRBC: 0 % (ref 0.0–0.2)
Platelet Count: 98 10*3/uL — ABNORMAL LOW (ref 150–400)
RBC: 4.19 MIL/uL (ref 3.87–5.11)
RDW: 13.7 % (ref 11.5–15.5)
WBC Count: 6 10*3/uL (ref 4.0–10.5)

## 2018-01-19 LAB — CMP (CANCER CENTER ONLY)
ALT: 15 U/L (ref 0–44)
ANION GAP: 8 (ref 5–15)
AST: 15 U/L (ref 15–41)
Albumin: 3.7 g/dL (ref 3.5–5.0)
Alkaline Phosphatase: 56 U/L (ref 38–126)
BUN: 17 mg/dL (ref 8–23)
CO2: 24 mmol/L (ref 22–32)
Calcium: 9.9 mg/dL (ref 8.9–10.3)
Chloride: 110 mmol/L (ref 98–111)
Creatinine: 1.37 mg/dL — ABNORMAL HIGH (ref 0.44–1.00)
GFR, EST AFRICAN AMERICAN: 43 mL/min — AB (ref >60)
GFR, Estimated: 38 mL/min — ABNORMAL LOW (ref >60)
Glucose, Bld: 115 mg/dL — ABNORMAL HIGH (ref 70–99)
Potassium: 5 mmol/L (ref 3.5–5.1)
SODIUM: 142 mmol/L (ref 135–145)
Total Bilirubin: 0.5 mg/dL (ref 0.3–1.2)
Total Protein: 7.1 g/dL (ref 6.5–8.1)

## 2018-01-19 NOTE — Telephone Encounter (Signed)
Printed avs and calender ofd upcoming appointment. Per 11/21 los

## 2018-02-15 ENCOUNTER — Ambulatory Visit (INDEPENDENT_AMBULATORY_CARE_PROVIDER_SITE_OTHER): Payer: Medicare Other | Admitting: General Practice

## 2018-02-15 DIAGNOSIS — Z7901 Long term (current) use of anticoagulants: Secondary | ICD-10-CM

## 2018-02-15 LAB — POCT INR: INR: 2.8 (ref 2.0–3.0)

## 2018-02-15 NOTE — Patient Instructions (Addendum)
Pre visit review using our clinic review tool, if applicable. No additional management support is needed unless otherwise documented below in the visit note.  Continue to take 1 tablet daily except 1 1/2 tablets on Wednesdays.  Re-check in 6 weeks.

## 2018-03-08 ENCOUNTER — Telehealth: Payer: Self-pay

## 2018-03-08 ENCOUNTER — Other Ambulatory Visit: Payer: Self-pay

## 2018-03-08 DIAGNOSIS — Z170 Estrogen receptor positive status [ER+]: Principal | ICD-10-CM

## 2018-03-08 DIAGNOSIS — C50412 Malignant neoplasm of upper-outer quadrant of left female breast: Secondary | ICD-10-CM

## 2018-03-08 NOTE — Telephone Encounter (Signed)
Patient called stating Dr. Burr Medico was to refer her to a GYN has not heard anything this was in November 2019.  Reviewed chart and saw notation where she was referred did not accept her insurance.  Re-entered referral for GYN noting must accept Medicare/Medicaid and marked urgent.  Called patient to let her know that we re-entered the referral and apologized for her not hearing.  She will call back in a week if she does not hear anything.

## 2018-03-22 ENCOUNTER — Telehealth: Payer: Self-pay

## 2018-03-22 NOTE — Telephone Encounter (Addendum)
Author phoned pt. To attempt to reschedule AWV. Appointment made for 7/16.

## 2018-03-29 ENCOUNTER — Ambulatory Visit (INDEPENDENT_AMBULATORY_CARE_PROVIDER_SITE_OTHER): Payer: Medicare Other | Admitting: General Practice

## 2018-03-29 DIAGNOSIS — Z7901 Long term (current) use of anticoagulants: Secondary | ICD-10-CM

## 2018-03-29 LAB — POCT INR: INR: 2.1 (ref 2.0–3.0)

## 2018-03-29 NOTE — Patient Instructions (Addendum)
Pre visit review using our clinic review tool, if applicable. No additional management support is needed unless otherwise documented below in the visit note.  Continue to take 1 tablet daily except 1 1/2 tablets on Wednesdays.  Re-check in 6 weeks.

## 2018-04-01 NOTE — Progress Notes (Signed)
I have reviewed available documentation from this visit and I agree with recommendations given.  Pyper Olexa G. Uri Turnbough, MD  [facility]. [place] office.  

## 2018-04-24 ENCOUNTER — Other Ambulatory Visit: Payer: Self-pay | Admitting: Family Medicine

## 2018-04-24 ENCOUNTER — Other Ambulatory Visit: Payer: Self-pay | Admitting: Physical Medicine & Rehabilitation

## 2018-04-24 DIAGNOSIS — M810 Age-related osteoporosis without current pathological fracture: Secondary | ICD-10-CM

## 2018-04-24 DIAGNOSIS — M159 Polyosteoarthritis, unspecified: Secondary | ICD-10-CM

## 2018-04-24 DIAGNOSIS — G894 Chronic pain syndrome: Secondary | ICD-10-CM

## 2018-04-25 ENCOUNTER — Telehealth: Payer: Self-pay | Admitting: Physical Medicine & Rehabilitation

## 2018-04-25 DIAGNOSIS — G894 Chronic pain syndrome: Secondary | ICD-10-CM

## 2018-04-25 DIAGNOSIS — M159 Polyosteoarthritis, unspecified: Secondary | ICD-10-CM

## 2018-04-25 MED ORDER — TRAMADOL HCL 50 MG PO TABS: 50.0000 mg | ORAL_TABLET | Freq: Three times a day (TID) | ORAL | 0 refills | Status: DC

## 2018-04-25 NOTE — Telephone Encounter (Signed)
According to PMP she filled her tramadol 02/14/18 and her RX expired 04/19/18 (6 mo) so she waited too long for the final refill.  She does not have appt scheduled which is required, so we have set her up with 05/18/18. I will call her one month supply in to pharmacy and must keep the March appt. She agrees.

## 2018-04-25 NOTE — Telephone Encounter (Signed)
Patient is calling to request a refill on Tramdol, states her bottle says she has 4 refills but pharmacy will no refill.  I explained to her that when she came in back in August he wrote her the prescription that with 5 refills that should last her until she comes back in 44months.  She said that she doesn't take them all the time.  Please call patient.

## 2018-04-26 ENCOUNTER — Telehealth: Payer: Self-pay | Admitting: *Deleted

## 2018-04-26 NOTE — Telephone Encounter (Signed)
Mrs Danielle [name] says the pharmacy did not have her tramadol. I have called it to the pharmacy again. I was unable to speak to anyone at the pharmacy and left it on VM.

## 2018-04-28 ENCOUNTER — Ambulatory Visit: Payer: Medicare Other | Admitting: Obstetrics and Gynecology

## 2018-04-28 ENCOUNTER — Encounter: Payer: Self-pay | Admitting: Obstetrics and Gynecology

## 2018-04-28 DIAGNOSIS — Z0000 Encounter for general adult medical examination without abnormal findings: Secondary | ICD-10-CM | POA: Insufficient documentation

## 2018-04-28 DIAGNOSIS — Z01419 Encounter for gynecological examination (general) (routine) without abnormal findings: Secondary | ICD-10-CM

## 2018-04-28 NOTE — Progress Notes (Signed)
Danielle [name] is a 73 y.o. She carries a H/0 of Stage 1A breast cancer ( ER and PR + , HER2 -), s/p lumpectomy and RT. On Tamoxifen for hormonal suppression.Nulligravid. LMP at age 36. Uncertain as to last pap smear. No H/O abnormal pap smears of STD. Chronic medical problems as noted. Stable as per pt. She has no GYN complaints today  Gynecologic History No LMP recorded. Patient is postmenopausal. Contraception: post menopausal status Last Pap: NA. Results were: normal Last mammogram: 2019. Results were: normal  Obstetric History OB History  No obstetric history on file.    Past Medical History:  Diagnosis Date  . Allergy   . Anxiety   . Aortic stenosis    Status post bioprosthetic AVR  . Arthritis    DJD  . Asthma   . Breast cancer ([place]) 12/13/2016   Left breast  . Chronic systolic CHF (congestive heart failure) ([place])    EF 30% 04/2014  . COPD (chronic obstructive pulmonary disease) ([place])   . Coronary artery disease    per pt, had LHC prior to AVR in [place] that did not show any blockages; no stents/bypass  . Gastritis   . Hyperlipidemia   . Hypertension   . Hyperthyroidism   . Multiple thyroid nodules   . Osteoporosis   . Paroxysmal atrial fibrillation ([facility])   . Pelvis fracture ([place]) 08/19/2015   MULTIPLE   . Personal history of radiation therapy 2018  . Spontaneous pneumothorax 11/28/2015   left   . Stroke [facility]) 2000   rt hand weak    Past Surgical History:  Procedure Laterality Date  . BREAST LUMPECTOMY Left 12/2016  . BREAST LUMPECTOMY WITH RADIOACTIVE SEED AND SENTINEL LYMPH NODE BIOPSY Left 01/14/2017   Procedure: LEFT BREAST LUMPECTOMY WITH RADIOACTIVE SEED AND SENTINEL LYMPH NODE BIOPSY;  Surgeon: Rolm Bookbinder, MD;  Location: [facility];  Service: General;  Laterality: Left;  . CHEST TUBE INSERTION  10/2015  . INTRAMEDULLARY (IM) NAIL INTERTROCHANTERIC Left 12/10/2015   Procedure: INTRAMEDULLARY (IM) NAIL INTERTROCHANTRIC;  Surgeon:  Leandrew Koyanagi, MD;  Location: [place];  Service: Orthopedics;  Laterality: Left;  . PLEURADESIS Left 12/03/2015   Procedure: PLEURADESIS;  Surgeon: Melrose Nakayama, MD;  Location: [place];  Service: Thoracic;  Laterality: Left;  . RESECTION OF APICAL BLEB Left 12/03/2015   Procedure: BLEBECTOMY;  Surgeon: Melrose Nakayama, MD;  Location: [place];  Service: Thoracic;  Laterality: Left;  . THORACOSCOPY  12/03/2015  . VALVE REPLACEMENT    . VIDEO ASSISTED THORACOSCOPY Left 12/03/2015   Procedure: VIDEO ASSISTED THORACOSCOPY;  Surgeon: Melrose Nakayama, MD;  Location: [place];  Service: Thoracic;  Laterality: Left;    Current Outpatient Medications on File Prior to Visit  Medication Sig Dispense Refill  . acetaminophen (TYLENOL) 500 MG tablet Take 1,000 mg by mouth daily as needed (PAIN).    Marland Kitchen albuterol (PROVENTIL HFA;VENTOLIN HFA) 108 (90 Base) MCG/ACT inhaler Inhale into the lungs.    Marland Kitchen alendronate (FOSAMAX) 70 MG tablet TAKE 1 TABLET BY MOUTH EVERY SATURDAY WITH  A  FULL  GLASS  OF  WATER  ON  A  EMPTY  STOMACH 4 tablet 0  . aspirin EC 81 MG tablet Take 81 mg daily by mouth.    . budesonide-formoterol (SYMBICORT) 160-4.5 MCG/ACT inhaler Inhale 2 puffs into the lungs 2 (two) times daily. 1 Inhaler 0  . carvedilol (COREG) 25 MG tablet Take 1 tablet (25 mg total) by mouth 2 (two) times  daily. 180 tablet 2  . Cholecalciferol (VITAMIN D3) 400 units tablet Take 1 tablet (400 Units total) by mouth daily. 30 tablet 0  . diclofenac sodium (VOLTAREN) 1 % GEL Apply 4 g topically 4 (four) times daily. 500 g 3  . diphenhydramine-acetaminophen (TYLENOL PM) 25-500 MG TABS tablet Take 2 tablets by mouth at bedtime as needed (SLEEP).     . furosemide (LASIX) 20 MG tablet TAKE 1 TABLET BY MOUTH DAILY 90 tablet 0  . gabapentin (NEURONTIN) 300 MG capsule Take 1 capsule (300 mg total) by mouth 3 (three) times daily. 90 capsule 1  . losartan (COZAAR) 50 MG tablet Take 1 tablet by mouth daily 90 tablet 3  .  methimazole (TAPAZOLE) 5 MG tablet TAKE 3 TABLETS BY MOUTH EVERY EVENING. 180 tablet 2  . rosuvastatin (CRESTOR) 40 MG tablet Take 1 tablet (40 mg total) by mouth daily. 90 tablet 3  . senna-docusate (SENOKOT-S) 8.6-50 MG tablet Take 2 tablets at bedtime by mouth.     . tamoxifen (NOLVADEX) 20 MG tablet Take 1 tablet (20 mg total) by mouth daily. 90 tablet 3  . traMADol (ULTRAM) 50 MG tablet Take 1 tablet (50 mg total) by mouth 3 (three) times daily. 90 tablet 0  . valACYclovir (VALTREX) 500 MG tablet Take 1 tablet (500 mg total) by mouth 2 (two) times daily. For 3 days when outbreaks. 24 tablet 1  . warfarin (COUMADIN) 3 MG tablet Take 1 tablet daily except 1 1/2 tablets on Wed. Or Take as directed by anticoagulation clinic. 35 tablet 3  . enoxaparin (LOVENOX) 150 MG/ML injection Inject 1 mL (150 mg total) into the skin daily. (Patient not taking: Reported on 04/28/2018) 7 Syringe 0  . ranitidine (ZANTAC) 150 MG tablet Take 1 tablet (150 mg total) by mouth 2 (two) times daily. (Patient not taking: Reported on 04/28/2018) 180 tablet 3   No current facility-administered medications on file prior to visit.     Allergies  Allergen Reactions  . Lisinopril Cough  . Tetanus Toxoid Adsorbed Swelling    Arm swelling    Social History   Socioeconomic History  . Marital status: Married    Spouse name: Sherwood  . Number of children: 0  . Years of education: Not on file  . Highest education level: Not on file  Occupational History  . Occupation: Retired in 2004  Social Needs  . Financial resource strain: Not on file  . Food insecurity:    Worry: Not on file    Inability: Not on file  . Transportation needs:    Medical: Not on file    Non-medical: Not on file  Tobacco Use  . Smoking status: Former Smoker    Packs/day: 0.25    Years: 10.00    Pack years: 2.50    Types: Cigarettes    Last attempt to quit: 03/01/2010    Years since quitting: 8.1  . Smokeless tobacco: Never Used  Substance  and Sexual Activity  . Alcohol use: No  . Drug use: No  . Sexual activity: Not Currently  Lifestyle  . Physical activity:    Days per week: Not on file    Minutes per session: Not on file  . Stress: Not on file  Relationships  . Social connections:    Talks on phone: Not on file    Gets together: Not on file    Attends religious service: Not on file    Active member of club or organization: Not on  file    Attends meetings of clubs or organizations: Not on file    Relationship status: Not on file  . Intimate partner violence:    Fear of current or ex partner: Not on file    Emotionally abused: Not on file    Physically abused: Not on file    Forced sexual activity: Not on file  Other Topics Concern  . Not on file  Social History Narrative   Lives with husband.  Ambulated independently.    Family History  Problem Relation Age of Onset  . Diabetes Mother   . Heart attack Mother 59  . Diabetes Father   . Lung cancer Father   . Diabetes Sister   . Thyroid disease Sister   . Diabetes Sister   . HIV Brother     The following portions of the patient's history were reviewed and updated as appropriate: allergies, current medications, past family history, past medical history, past social history, past surgical history and problem list.  Review of Systems Pertinent items noted in HPI and remainder of comprehensive ROS otherwise negative.   Objective:  BP 134/78   Pulse 79   Ht '5\' 7"'  (1.702 m)   Wt 248 lb 9.6 oz (112.8 kg)   BMI 38.94 kg/m  CONSTITUTIONAL: Well-developed, well-nourished female in no acute distress.  HENT:  Normocephalic, atraumatic, External right and left ear normal. Oropharynx is clear and moist EYES: Conjunctivae and EOM are normal. Pupils are equal, round, and reactive to light. No scleral icterus.  NECK: Normal range of motion, supple, no masses.  Normal thyroid.  SKIN: Skin is warm and dry. No rash noted. Not diaphoretic. No erythema. No pallor. CABG  scar noted NEUROLGIC: Alert and oriented to person, place, and time. Normal reflexes, muscle tone coordination. No cranial nerve deficit noted. PSYCHIATRIC: Normal mood and affect. Normal behavior. Normal judgment and thought content. CARDIOVASCULAR: Normal heart rate noted, regular rhythm RESPIRATORY: Clear to auscultation bilaterally. Effort and breath sounds normal, no problems with respiration noted. BREASTS: Symmetric in size. RT skin changes noted. Lumpectomy scar and post surgerical scar changes noted. No nipple discharge or adenopathy. ABDOMEN: Soft, normal bowel sounds, no distention noted.  No tenderness, rebound or guarding.  PELVIC: Pt declined MUSCULOSKELETAL: Normal range of motion. No tenderness.  No cyanosis, clubbing, or edema.  2+ distal pulses.   Assessment:  Annual gynecologic examination with pap smear Stage 1 A breast cancer Multiple medical problems as noted Plan:  Pap smear not indicated d/t to age. Tamoxifen and endometrial risks reviewed. Pt is Asx at present and no w/u indicated. Pt instructed to call for any vaginal bleeding. She o/w will continue her care with her PCP and Heme/Onc. Routine preventative health maintenance measures emphasized. Please refer to After Visit Summary for other counseling recommendations.    Chancy Milroy, MD, [place] Attending [place] for [facility], [place]

## 2018-04-28 NOTE — Patient Instructions (Signed)

## 2018-04-28 NOTE — Progress Notes (Signed)
New GYN, pt states that she previously had breast cancer. Pt was advised that she was referred to have her breast checked due to her taking tamoxifen.

## 2018-05-08 ENCOUNTER — Ambulatory Visit (INDEPENDENT_AMBULATORY_CARE_PROVIDER_SITE_OTHER): Payer: Medicare Other | Admitting: General Practice

## 2018-05-08 DIAGNOSIS — Z7901 Long term (current) use of anticoagulants: Secondary | ICD-10-CM | POA: Diagnosis not present

## 2018-05-08 LAB — POCT INR: INR: 3.8 — AB (ref 2.0–3.0)

## 2018-05-08 NOTE — Patient Instructions (Addendum)
Pre visit review using our clinic review tool, if applicable. No additional management support is needed unless otherwise documented below in the visit note.  Hold coumadin today (3/9) and then continue to take 1 tablet daily except 1 1/2 tablets on Wednesdays.  Re-check in 3 weeks.

## 2018-05-09 ENCOUNTER — Other Ambulatory Visit: Payer: Self-pay | Admitting: Internal Medicine

## 2018-05-10 ENCOUNTER — Ambulatory Visit: Payer: Medicare Other

## 2018-05-18 ENCOUNTER — Encounter: Payer: Medicare Other | Attending: Physical Medicine & Rehabilitation

## 2018-05-18 ENCOUNTER — Other Ambulatory Visit: Payer: Self-pay

## 2018-05-18 ENCOUNTER — Ambulatory Visit: Payer: Medicare Other | Admitting: Physical Medicine & Rehabilitation

## 2018-05-18 ENCOUNTER — Encounter: Payer: Self-pay | Admitting: Physical Medicine & Rehabilitation

## 2018-05-18 DIAGNOSIS — G894 Chronic pain syndrome: Secondary | ICD-10-CM | POA: Diagnosis not present

## 2018-05-18 DIAGNOSIS — M159 Polyosteoarthritis, unspecified: Secondary | ICD-10-CM | POA: Diagnosis not present

## 2018-05-18 MED ORDER — GABAPENTIN 300 MG PO CAPS: 300.0000 mg | ORAL_CAPSULE | Freq: Every day | ORAL | 5 refills | Status: DC

## 2018-05-18 MED ORDER — DICLOFENAC SODIUM 1 % TD GEL: 4.0000 g | Freq: Four times a day (QID) | TRANSDERMAL | 3 refills | Status: DC

## 2018-05-18 MED ORDER — TRAMADOL HCL 50 MG PO TABS: 50.0000 mg | ORAL_TABLET | Freq: Three times a day (TID) | ORAL | 5 refills | Status: DC

## 2018-05-18 MED ORDER — DICLOFENAC EPOLAMINE 1.3 % TD PTCH: 1.0000 | MEDICATED_PATCH | Freq: Two times a day (BID) | TRANSDERMAL | 1 refills | Status: DC

## 2018-05-18 MED ORDER — GABAPENTIN 300 MG PO CAPS: 600.0000 mg | ORAL_CAPSULE | Freq: Every day | ORAL | 5 refills | Status: DC

## 2018-05-18 NOTE — Progress Notes (Signed)
Subjective:    Patient ID: Danielle [name], female    DOB: Jan 29, 1946, 73 y.o.   MRN: [id number]  HPI CC:  RIght shoulder  2 wk hx of Right shoulder pain, non traumatic, no hx of shoulder surgery.  Lateral aspect main problem, no numbness in shoulder but had tingling in fingers for 1-2 day Pain Inventory Average Pain 8 Pain Right Now 8 My pain is constant and aching  In the last 24 hours, has pain interfered with the following? General activity 5 Relation with others 7 Enjoyment of life 0 What TIME of day is your pain at its worst? varies Sleep (in general) Fair  Pain is worse with: walking Pain improves with: heat/ice Relief from Meds: 5  Mobility use a walker  Function retired  Neuro/Psych No problems in this area  Prior Studies Any changes since last visit?  no  Physicians involved in your care Any changes since last visit?  no   Family History  Problem Relation Age of Onset  . Diabetes Mother   . Heart attack Mother 92  . Diabetes Father   . Lung cancer Father   . Diabetes Sister   . Thyroid disease Sister   . Diabetes Sister   . HIV Brother    Social History   Socioeconomic History  . Marital status: Married    Spouse name: Sherwood  . Number of children: 0  . Years of education: Not on file  . Highest education level: Not on file  Occupational History  . Occupation: Retired in 2004  Social Needs  . Financial resource strain: Not on file  . Food insecurity:    Worry: Not on file    Inability: Not on file  . Transportation needs:    Medical: Not on file    Non-medical: Not on file  Tobacco Use  . Smoking status: Former Smoker    Packs/day: 0.25    Years: 10.00    Pack years: 2.50    Types: Cigarettes    Last attempt to quit: 03/01/2010    Years since quitting: 8.2  . Smokeless tobacco: Never Used  Substance and Sexual Activity  . Alcohol use: No  . Drug use: No  . Sexual activity: Not Currently  Lifestyle  . Physical  activity:    Days per week: Not on file    Minutes per session: Not on file  . Stress: Not on file  Relationships  . Social connections:    Talks on phone: Not on file    Gets together: Not on file    Attends religious service: Not on file    Active member of club or organization: Not on file    Attends meetings of clubs or organizations: Not on file    Relationship status: Not on file  Other Topics Concern  . Not on file  Social History Narrative   Lives with husband.  Ambulated independently.   Past Surgical History:  Procedure Laterality Date  . BREAST LUMPECTOMY Left 12/2016  . BREAST LUMPECTOMY WITH RADIOACTIVE SEED AND SENTINEL LYMPH NODE BIOPSY Left 01/14/2017   Procedure: LEFT BREAST LUMPECTOMY WITH RADIOACTIVE SEED AND SENTINEL LYMPH NODE BIOPSY;  Surgeon: Rolm Bookbinder, MD;  Location: [place];  Service: General;  Laterality: Left;  . CHEST TUBE INSERTION  10/2015  . INTRAMEDULLARY (IM) NAIL INTERTROCHANTERIC Left 12/10/2015   Procedure: INTRAMEDULLARY (IM) NAIL INTERTROCHANTRIC;  Surgeon: Leandrew Koyanagi, MD;  Location: [place];  Service: Orthopedics;  Laterality: Left;  .  PLEURADESIS Left 12/03/2015   Procedure: PLEURADESIS;  Surgeon: Melrose Nakayama, MD;  Location: [place];  Service: Thoracic;  Laterality: Left;  . RESECTION OF APICAL BLEB Left 12/03/2015   Procedure: BLEBECTOMY;  Surgeon: Melrose Nakayama, MD;  Location: [place];  Service: Thoracic;  Laterality: Left;  . THORACOSCOPY  12/03/2015  . VALVE REPLACEMENT    . VIDEO ASSISTED THORACOSCOPY Left 12/03/2015   Procedure: VIDEO ASSISTED THORACOSCOPY;  Surgeon: Melrose Nakayama, MD;  Location: [place];  Service: Thoracic;  Laterality: Left;   Past Medical History:  Diagnosis Date  . Allergy   . Anxiety   . Aortic stenosis    Status post bioprosthetic AVR  . Arthritis    DJD  . Asthma   . Breast cancer ([place]) 12/13/2016   Left breast  . Chronic systolic CHF (congestive heart failure) ([place])    EF 30%  04/2014  . COPD (chronic obstructive pulmonary disease) ([place])   . Coronary artery disease    per pt, had LHC prior to AVR in [place] that did not show any blockages; no stents/bypass  . Gastritis   . Hyperlipidemia   . Hypertension   . Hyperthyroidism   . Multiple thyroid nodules   . Osteoporosis   . Paroxysmal atrial fibrillation ([facility])   . Pelvis fracture ([place]) 08/19/2015   MULTIPLE   . Personal history of radiation therapy 2018  . Spontaneous pneumothorax 11/28/2015   left   . Stroke ([place]) 2000   rt hand weak   BP 110/71   Pulse 77   Ht 5\' 7"  (1.702 m)   Wt 247 lb (112 kg)   SpO2 95%   BMI 38.69 kg/m   Opioid Risk Score:   Fall Risk Score:  `1  Depression screen PHQ 2/9  Depression screen [facility] 2/9 01/04/2018 10/01/2017 09/28/2017 06/21/2017 05/03/2017 01/18/2017 01/13/2017  Decreased Interest 0 0 0 0 0 0 0  Down, Depressed, Hopeless 0 0 0 0 0 0 0  PHQ - 2 Score 0 0 0 0 0 0 0  Altered sleeping - - - - - - -  Tired, decreased energy - - - - - - -  Change in appetite - - - - - - -  Feeling bad or failure about yourself  - - - - - - -  Trouble concentrating - - - - - - -  Moving slowly or fidgety/restless - - - - - - -  Suicidal thoughts - - - - - - -  PHQ-9 Score - - - - - - -  Difficult doing work/chores - - - - - - -  Some recent data might be hidden    Review of Systems  Constitutional: Negative.   HENT: Negative.   Eyes: Negative.   Respiratory: Negative.   Cardiovascular: Negative.   Gastrointestinal: Negative.   Endocrine: Negative.   Genitourinary: Negative.   Musculoskeletal: Positive for arthralgias, back pain and gait problem.  Skin: Negative.   Allergic/Immunologic: Negative.   Hematological: Negative.   Psychiatric/Behavioral: Negative.   All other systems reviewed and are negative.      Objective:   Physical Exam Vitals signs and nursing note reviewed.  Constitutional:      Appearance: Normal appearance. She is obese.  HENT:     Head:  Normocephalic and atraumatic.     Nose: Nose normal.     Mouth/Throat:     Pharynx: Oropharynx is clear.  Eyes:     Extraocular Movements:  Extraocular movements intact.     Conjunctiva/sclera: Conjunctivae normal.     Pupils: Pupils are equal, round, and reactive to light.  Cardiovascular:     Rate and Rhythm: Normal rate and regular rhythm.     Heart sounds: Normal heart sounds. No murmur.  Pulmonary:     Effort: Pulmonary effort is normal.     Breath sounds: Normal breath sounds.  Abdominal:     General: Abdomen is flat. Bowel sounds are normal.     Palpations: Abdomen is soft.  Neurological:     General: No focal deficit present.     Mental Status: She is alert and oriented to person, place, and time.  Psychiatric:        Mood and Affect: Mood normal.        Behavior: Behavior normal.    Right shoulder has no pain with external rotation.  There is pain with abduction at about 90 degrees. There is tenderness at the acromioclavicular joint mild tenderness in the subacromial area to palpation.  No tenderness along the upper trapezius Cervical spine range of motion is normal without pain Motor strength is 5/5 bilateral deltoid bicep tricep grip as well as hip flexion knee extension ankle dorsiflexion Sensation normal in the upper and lower limbs Deep tendon reflex are normal in the upper limbs and trace in the lower limbs bilaterally at the knees and ankles.  Lumbar spine has reduced range of motion approximately 50% flexion extension lateral bending and rotation      Assessment & Plan:  5.  73 year old female with chronic lumbar pain lumbar spondylosis but no evidence of radiculopathy at the current time. Her low back pain is well managed with tramadol 50 mg 3 times daily, she only gets radicular pain at night therefore will switch gabapentin to bedtime only We discussed increasing her gabapentin to 600 mg nightly and not taking it during the day. 2.  Right shoulder pain this  is actually her primary complaint.  It appears to be more in the acromioclavicular area although she does have some impingement signs as well as she may have some rotator cuff degeneration. We will prescribe diclofenac gel and patches.  She will check the price of each of these and select the more cost effective medication. If no improvement in 2 weeks patient to call back and we will order right shoulder x-ray 2 views.  We discussed possibility of right shoulder corticosteroid injection but will hold off until we see the effect of the conservative care.

## 2018-05-18 NOTE — Patient Instructions (Signed)
Call if shoulder is not improving after 2 wks and I'll order an xray

## 2018-05-24 ENCOUNTER — Ambulatory Visit: Payer: Medicare Other | Admitting: Internal Medicine

## 2018-05-29 ENCOUNTER — Ambulatory Visit (INDEPENDENT_AMBULATORY_CARE_PROVIDER_SITE_OTHER): Payer: Medicare Other | Admitting: General Practice

## 2018-05-29 ENCOUNTER — Other Ambulatory Visit: Payer: Self-pay

## 2018-05-29 DIAGNOSIS — Z7901 Long term (current) use of anticoagulants: Secondary | ICD-10-CM | POA: Diagnosis not present

## 2018-05-29 DIAGNOSIS — Z5181 Encounter for therapeutic drug level monitoring: Secondary | ICD-10-CM

## 2018-05-29 DIAGNOSIS — I639 Cerebral infarction, unspecified: Secondary | ICD-10-CM

## 2018-05-29 LAB — POCT INR: INR: 4.5 — AB (ref 2.0–3.0)

## 2018-05-29 NOTE — Patient Instructions (Incomplete)
Pre visit review using our clinic review tool, if applicable. No additional management support is needed unless otherwise documented below in the visit note.  Hold coumadin today (3/30) and tomorrow (3/31) then change dosage and take 1 tablet daily.  Re-check in 3 weeks.

## 2018-06-02 ENCOUNTER — Telehealth: Payer: Self-pay | Admitting: *Deleted

## 2018-06-02 NOTE — Telephone Encounter (Signed)

## 2018-06-06 NOTE — Progress Notes (Signed)
Virtual Visit via Video Note   This visit type was conducted due to national recommendations for restrictions regarding the COVID-19 Pandemic (e.g. social distancing) in an effort to limit this patient's exposure and mitigate transmission in our community.  Due to her co-morbid illnesses, this patient is at least at moderate risk for complications without adequate follow up.  This format is felt to be most appropriate for this patient at this time.  All issues noted in this document were discussed and addressed.  A limited physical exam was performed with this format.  Verbal consent was obtained from the patient.  Video visit was attempted but had to be converted to a telephone visit due to technical difficulties with the patient's device.  Evaluation Performed:  Follow-up visit  Date:  06/07/2018   ID:  Danielle [name], DOB 08/26/1945, MRN [id number]  Patient Location: Home  Provider Location: Office  PCP:  [name], [name], MD  Cardiologist:  [name], MD Electrophysiologist:  None   Chief Complaint: Follow-up chronic systolic heart failure and valvular heart disease  History of Present Illness:    Danielle [name] is a 73 y.o. female who presents via audio/video conferencing for a telehealth visit today.  She has a history of aortic valve disease (severe regurgitation by her description) s/p bioprosthetic aortic valve replacement in [place] in 03/2014, NICM with LVEF as low as 30-35% by report in 03/2015, paroxysmal atrial fibrillation on chronic warfarin, stroke x 2, hyperlipidemia, hypertension, hyperthyroidism, left breast cancer, spontaneous pneumothorax, and left hip fracture.  I last saw her in 09/2017 at our [facility] office.  At that time, she was doing well other than chronic two-pillow orthopnea that predated her AVR.  Today, the patient reports feeling relatively well.  She is still somewhat weak and off balance.  Therefore, she typically walks with  her walker rather than a cane.  She is not working with physical therapy but is trying to build up her strength on her own and plans to purchase a stationary bicycle later this month.  She has stable dyspnea on exertion and orthopnea.  She denies chest pain and edema.  She notes one episode of palpitations while watching the news about COVID-19.  Felt like her heart was racing for a few minutes with accompanying shortness of breath.  That resolved spontaneously and has not recurred.  Chronic vertigo is stable.  She has not fallen or passed out.  The patient remains compliant with her medications, including warfarin that is managed through her PCPs office.  She has not had any significant bleeding.  She is practicing social distancing.  The patient does not have symptoms concerning for COVID-19 infection (fever, chills, cough, or new shortness of breath).  Wt down some but wants to lose more.   Past Medical History:  Diagnosis Date  . Allergy   . Anxiety   . Aortic stenosis    Status post bioprosthetic AVR  . Arthritis    DJD  . Asthma   . Breast cancer ([place]) 12/13/2016   Left breast  . Chronic systolic CHF (congestive heart failure) ([place])    EF 30% 04/2014  . COPD (chronic obstructive pulmonary disease) ([place])   . Coronary artery disease    per pt, had LHC prior to AVR in [place] that did not show any blockages; no stents/bypass  . Gastritis   . Hyperlipidemia   . Hypertension   . Hyperthyroidism   . Multiple thyroid nodules   . Osteoporosis   .  Paroxysmal atrial fibrillation ([facility])   . Pelvis fracture ([place]) 08/19/2015   MULTIPLE   . Personal history of radiation therapy 2018  . Spontaneous pneumothorax 11/28/2015   left   . Stroke [facility]) 2000   rt hand weak   Past Surgical History:  Procedure Laterality Date  . BREAST LUMPECTOMY Left 12/2016  . BREAST LUMPECTOMY WITH RADIOACTIVE SEED AND SENTINEL LYMPH NODE BIOPSY Left 01/14/2017   Procedure: LEFT BREAST LUMPECTOMY WITH  RADIOACTIVE SEED AND SENTINEL LYMPH NODE BIOPSY;  Surgeon: Rolm Bookbinder, MD;  Location: [place];  Service: General;  Laterality: Left;  . CHEST TUBE INSERTION  10/2015  . INTRAMEDULLARY (IM) NAIL INTERTROCHANTERIC Left 12/10/2015   Procedure: INTRAMEDULLARY (IM) NAIL INTERTROCHANTRIC;  Surgeon: Leandrew Koyanagi, MD;  Location: [place];  Service: Orthopedics;  Laterality: Left;  . PLEURADESIS Left 12/03/2015   Procedure: PLEURADESIS;  Surgeon: Melrose Nakayama, MD;  Location: [place];  Service: Thoracic;  Laterality: Left;  . RESECTION OF APICAL BLEB Left 12/03/2015   Procedure: BLEBECTOMY;  Surgeon: Melrose Nakayama, MD;  Location: [place];  Service: Thoracic;  Laterality: Left;  . THORACOSCOPY  12/03/2015  . VALVE REPLACEMENT    . VIDEO ASSISTED THORACOSCOPY Left 12/03/2015   Procedure: VIDEO ASSISTED THORACOSCOPY;  Surgeon: Melrose Nakayama, MD;  Location: [place];  Service: Thoracic;  Laterality: Left;     Current Meds  Medication Sig  . acetaminophen (TYLENOL) 500 MG tablet Take 1,000 mg by mouth daily as needed (PAIN).  Marland Kitchen albuterol (PROVENTIL HFA;VENTOLIN HFA) 108 (90 Base) MCG/ACT inhaler Inhale into the lungs.  Marland Kitchen alendronate (FOSAMAX) 70 MG tablet TAKE 1 TABLET BY MOUTH EVERY SATURDAY WITH  A  FULL  GLASS  OF  WATER  ON  A  EMPTY  STOMACH  . aspirin EC 81 MG tablet Take 81 mg daily by mouth.  . budesonide-formoterol (SYMBICORT) 160-4.5 MCG/ACT inhaler Inhale 2 puffs into the lungs 2 (two) times daily.  . carvedilol (COREG) 25 MG tablet Take 1 tablet by mouth twice daily  . Cholecalciferol (VITAMIN D3) 400 units tablet Take 1 tablet (400 Units total) by mouth daily.  . diclofenac sodium (VOLTAREN) 1 % GEL Apply 4 g topically 4 (four) times daily.  . diphenhydramine-acetaminophen (TYLENOL PM) 25-500 MG TABS tablet Take 2 tablets by mouth at bedtime as needed (SLEEP).   . furosemide (LASIX) 20 MG tablet TAKE 1 TABLET BY MOUTH DAILY  . gabapentin (NEURONTIN) 300 MG capsule Take 2  capsules (600 mg total) by mouth at bedtime.  Marland Kitchen losartan (COZAAR) 50 MG tablet Take 1 tablet by mouth once daily  . methimazole (TAPAZOLE) 5 MG tablet TAKE 3 TABLETS BY MOUTH EVERY EVENING.  . rosuvastatin (CRESTOR) 40 MG tablet Take 1 tablet (40 mg total) by mouth daily.  Marland Kitchen senna-docusate (SENOKOT-S) 8.6-50 MG tablet Take 2 tablets at bedtime by mouth.   . tamoxifen (NOLVADEX) 20 MG tablet Take 1 tablet (20 mg total) by mouth daily.  . traMADol (ULTRAM) 50 MG tablet Take 1 tablet (50 mg total) by mouth 3 (three) times daily.  . valACYclovir (VALTREX) 500 MG tablet Take 1 tablet (500 mg total) by mouth 2 (two) times daily. For 3 days when outbreaks.  Marland Kitchen warfarin (COUMADIN) 3 MG tablet Take 1 tablet daily except 1 1/2 tablets on Wed. Or Take as directed by anticoagulation clinic.     Allergies:   Lisinopril and Tetanus toxoid adsorbed   Social History   Tobacco Use  . Smoking status: Former  Smoker    Packs/day: 0.25    Years: 10.00    Pack years: 2.50    Types: Cigarettes    Last attempt to quit: 03/01/2010    Years since quitting: 8.2  . Smokeless tobacco: Never Used  Substance Use Topics  . Alcohol use: No  . Drug use: No     Family Hx: The patient's family history includes Diabetes in her father, mother, sister, and sister; HIV in her brother; Heart attack (age of onset: 74) in her mother; Lung cancer in her father; Thyroid disease in her sister.  ROS:   Please see the history of present illness.   All other systems reviewed and are negative.   Prior CV studies:   The following studies were reviewed today:  Cath/PCI:  None available (patient reports cath without significant CAD in the past)  CV Surgery:  Bioprosthetic aortic valve replacement (03/12/14, [place], [place])  EP Procedures and Devices:  None  Non-Invasive Evaluation(s):  TTE (12/31/16): Normal LV size with moderate LVH.  LVEF 35-40% without regional wall motion abnormality.  Restrictive  physiology with elevated filling pressure.  Bioprosthetic aortic valve with mean gradient 9 mmHg.  Mild left atrial enlargement.  TTE (04/21/16): Normal obese size with moderate LVH. LVEF 35-40% with mid and apical anteroseptal hypokinesis. Grade 3 diastolic dysfunction noted. Aortic valve bioprosthesis present with a mean gradient of 11 mmHg. Mitral annular calcification noted. Normal RV size and function. Mild right atrial enlargement.  TTE (03/27/15, OSH): Mild LVH with LVEF 30-35%, mild left atrial enlargement, AVR in place without regurgitation.  TTE (05/10/14, OSH): LVEF 45-50%.  Labs/Other Tests and Data Reviewed:    EKG:  No ECG reviewed.  Recent Labs: 01/04/2018: TSH 2.39 01/19/2018: ALT 15; BUN 17; Creatinine 1.37; Hemoglobin 12.8; Platelet Count 98; Potassium 5.0; Sodium 142   Recent Lipid Panel Lab Results  Component Value Date/Time   CHOL 154 09/28/2017 08:06 AM   TRIG 94.0 09/28/2017 08:06 AM   HDL 64.10 09/28/2017 08:06 AM   CHOLHDL 2 09/28/2017 08:06 AM   LDLCALC 71 09/28/2017 08:06 AM    Wt Readings from Last 3 Encounters:  06/07/18 230 lb (104.3 kg)  05/18/18 247 lb (112 kg)  04/28/18 248 lb 9.6 oz (112.8 kg)     Objective:    Vital Signs:  BP 116/74 (BP Location: Left Arm, Patient Position: Sitting, Cuff Size: Normal)   Pulse 82   Ht 5\' 7"  (1.702 m)   Wt 230 lb (104.3 kg)   BMI 36.02 kg/m    ASSESSMENT & PLAN:    Chronic systolic 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 Kennis Carina has stable NYHA class II symptoms.  Weight is actually down by her report.  We will plan to continue current doses of carvedilol and losartan, as well as furosemide.  We will reassess her LVEF with an echo when she follows up in 6 months, sooner if she develops edema or worsening of her chronic dyspnea/orthopnea.  Aortic valve status post aortic valve replacement: No symptoms to suggest valvular incompetence.  Continue aspirin and warfarin.  Importance of antibiotic  prophylaxis for dental procedures reiterated.  We will plan to reassess the valve with echo in 6 months when she returns for follow-up.  Paroxysmal atrial fibrillation: Ms. Kennis Carina notes one episode of palpitations during a stressful situation.  This certainly could have been a brief run of A. fib.  Fortunately, she has not had a recurrence.  We will continue current dose of  carvedilol as well as indefinite anticoagulation with warfarin (managed by her PCP, Dr. [name]).  COVID-19 Education: The signs and symptoms of COVID-19 were discussed with the patient and how to seek care for testing (follow up with PCP or arrange E-visit).  The importance of social distancing was discussed today.  Time:   Today, I have spent 12 minutes with the patient with telehealth technology discussing the above problems.     Medication Adjustments/Labs and Tests Ordered: Current medicines are reviewed at length with the patient today.  Concerns regarding medicines are outlined above.   Tests Ordered: No orders of the defined types were placed in this encounter.  Medication Changes: No orders of the defined types were placed in this encounter.   Disposition:  Follow up in 6 month(s)  Signed, [name], MD  06/07/2018 11:38 AM    [facility]

## 2018-06-07 ENCOUNTER — Telehealth (INDEPENDENT_AMBULATORY_CARE_PROVIDER_SITE_OTHER): Payer: Medicare Other | Admitting: Internal Medicine

## 2018-06-07 ENCOUNTER — Other Ambulatory Visit: Payer: Self-pay

## 2018-06-07 ENCOUNTER — Encounter: Payer: Self-pay | Admitting: Internal Medicine

## 2018-06-07 DIAGNOSIS — I5022 Chronic systolic (congestive) heart failure: Secondary | ICD-10-CM | POA: Diagnosis not present

## 2018-06-07 DIAGNOSIS — Z952 Presence of prosthetic heart valve: Secondary | ICD-10-CM | POA: Diagnosis not present

## 2018-06-07 DIAGNOSIS — I359 Nonrheumatic aortic valve disorder, unspecified: Secondary | ICD-10-CM | POA: Diagnosis not present

## 2018-06-07 DIAGNOSIS — I480 Paroxysmal atrial fibrillation: Secondary | ICD-10-CM

## 2018-06-07 NOTE — Patient Instructions (Signed)
Medication Instructions:  Your physician recommends that you continue on your current medications as directed. Please refer to the Current Medication list given to you today.  If you need a refill on your cardiac medications before your next appointment, please call your pharmacy.   Lab work: none If you have labs (blood work) drawn today and your tests are completely normal, you will receive your results only by: Marland Kitchen MyChart Message (if you have MyChart) OR . A paper copy in the mail If you have any lab test that is abnormal or we need to change your treatment, we will call you to review the results.  Testing/Procedures: Your physician has requested that you have an echocardiogram in 6 months on same day as appointment before appointment. Echocardiography is a painless test that uses sound waves to create images of your heart. It provides your doctor with information about the size and shape of your heart and how well your heart's chambers and valves are working. This procedure takes approximately one hour. There are no restrictions for this procedure. You may get an IV, if needed, to receive an ultrasound enhancing agent through to better visualize your heart.     Follow-Up: At [facility], you and your health needs are our priority.  As part of our continuing mission to provide you with exceptional heart care, we have created designated Provider Care Teams.  These Care Teams include your primary Cardiologist (physician) and Advanced Practice Providers (APPs -  Physician Assistants and Nurse Practitioners) who all work together to provide you with the care you need, when you need it. You will need a follow up appointment in 6 months on the same day as echo (have echo before appointment with Dr End.).  Please call our office 2 months in advance to schedule this appointment.  You may see [name], MD or one of the following Advanced Practice Providers on your designated Care Team:    Murray Hodgkins, NP Christell Faith, PA-C . Marrianne Mood, PA-C     Echocardiogram An echocardiogram is a procedure that uses painless sound waves (ultrasound) to produce an image of the heart. Images from an echocardiogram can provide important information about:  Signs of coronary artery disease (CAD).  Aneurysm detection. An aneurysm is a weak or damaged part of an artery wall that bulges out from the normal force of blood pumping through the body.  Heart size and shape. Changes in the size or shape of the heart can be associated with certain conditions, including heart failure, aneurysm, and CAD.  Heart muscle function.  Heart valve function.  Signs of a past heart attack.  Fluid buildup around the heart.  Thickening of the heart muscle.  A tumor or infectious growth around the heart valves. Tell a health care provider about:  Any allergies you have.  All medicines you are taking, including vitamins, herbs, eye drops, creams, and over-the-counter medicines.  Any blood disorders you have.  Any surgeries you have had.  Any medical conditions you have.  Whether you are pregnant or may be pregnant. What are the risks? Generally, this is a safe procedure. However, problems may occur, including:  Allergic reaction to dye (contrast) that may be used during the procedure. What happens before the procedure? No specific preparation is needed. You may eat and drink normally. What happens during the procedure?   An IV tube may be inserted into one of your veins.  You may receive contrast through this tube. A contrast is  an injection that improves the quality of the pictures from your heart.  A gel will be applied to your chest.  A wand-like tool (transducer) will be moved over your chest. The gel will help to transmit the sound waves from the transducer.  The sound waves will harmlessly bounce off of your heart to allow the heart images to be captured in real-time  motion. The images will be recorded on a computer. The procedure may vary among health care providers and hospitals. What happens after the procedure?  You may return to your normal, everyday life, including diet, activities, and medicines, unless your health care provider tells you not to do that. Summary  An echocardiogram is a procedure that uses painless sound waves (ultrasound) to produce an image of the heart.  Images from an echocardiogram can provide important information about the size and shape of your heart, heart muscle function, heart valve function, and fluid buildup around your heart.  You do not need to do anything to prepare before this procedure. You may eat and drink normally.  After the echocardiogram is completed, you may return to your normal, everyday life, unless your health care provider tells you not to do that. This information is not intended to replace advice given to you by your health care provider. Make sure you discuss any questions you have with your health care provider. Document Released: 02/13/2000 Document Revised: 03/20/2016 Document Reviewed: 03/20/2016 Elsevier Interactive Patient Education  2019 [employer].

## 2018-06-16 ENCOUNTER — Ambulatory Visit: Payer: Medicare Other | Admitting: Internal Medicine

## 2018-06-19 ENCOUNTER — Ambulatory Visit: Payer: Medicare Other

## 2018-06-20 ENCOUNTER — Other Ambulatory Visit: Payer: Self-pay | Admitting: Family Medicine

## 2018-06-20 DIAGNOSIS — I480 Paroxysmal atrial fibrillation: Secondary | ICD-10-CM

## 2018-06-21 ENCOUNTER — Ambulatory Visit (INDEPENDENT_AMBULATORY_CARE_PROVIDER_SITE_OTHER): Payer: Medicare Other | Admitting: General Practice

## 2018-06-21 ENCOUNTER — Other Ambulatory Visit: Payer: Self-pay

## 2018-06-21 DIAGNOSIS — Z7901 Long term (current) use of anticoagulants: Secondary | ICD-10-CM

## 2018-06-21 LAB — POCT INR: INR: 2.8 (ref 2.0–3.0)

## 2018-06-21 NOTE — Patient Instructions (Signed)
Pre visit review using our clinic review tool, if applicable. No additional management support is needed unless otherwise documented below in the visit note.  Continue to take 1 tablet daily.  Re-check in 4 weeks.

## 2018-06-23 ENCOUNTER — Ambulatory Visit: Payer: Medicare Other

## 2018-07-06 ENCOUNTER — Other Ambulatory Visit: Payer: Self-pay | Admitting: Family Medicine

## 2018-07-06 DIAGNOSIS — M810 Age-related osteoporosis without current pathological fracture: Secondary | ICD-10-CM

## 2018-07-12 ENCOUNTER — Other Ambulatory Visit: Payer: Self-pay | Admitting: Hematology

## 2018-07-14 ENCOUNTER — Telehealth: Payer: Self-pay | Admitting: Emergency Medicine

## 2018-07-14 NOTE — Telephone Encounter (Signed)
Returning pt's phone call reporting that pharmacy would not refill tamoxifen while pt receiving a blood thinner as well.  LVM explaining that per MD Burr Medico she will discuss the medication next week during her visit and that it is ok to hold the medication until then.  Returning pharmacy's phone call requesting clarification on pt receiving tamoxifen and blood thinner, states they will not refill until this is followed up on.  MD Burr Medico spoke with pharmacist and decided pt would instead continue to receive tamoxifen, pharmacy said they would be ok to dispense it to patient so long as her INR levels are consistently checked.  MD Burr Medico will f/u with patient about this next week during visit.  Spoke to pt, per MD Burr Medico she will continue with Tamoxifen and the pharmacy will refill it.  Disregard first VM.  Pt VU of this and time/date of appt for next week on 5/21 with MD Burr Medico.

## 2018-07-18 ENCOUNTER — Other Ambulatory Visit: Payer: Self-pay | Admitting: Family Medicine

## 2018-07-18 DIAGNOSIS — E0590 Thyrotoxicosis, unspecified without thyrotoxic crisis or storm: Secondary | ICD-10-CM

## 2018-07-19 ENCOUNTER — Other Ambulatory Visit: Payer: Self-pay | Admitting: Family Medicine

## 2018-07-19 ENCOUNTER — Ambulatory Visit: Payer: Medicare Other

## 2018-07-19 DIAGNOSIS — E0590 Thyrotoxicosis, unspecified without thyrotoxic crisis or storm: Secondary | ICD-10-CM

## 2018-07-19 NOTE — Progress Notes (Signed)
[place]   Telephone:([phone]) [phone] Fax:(336) [phone]   Clinic Follow up Note   Patient Care Team: [name], [name], MD as PCP - General (Family Medicine) End, Harrell Gave, MD as PCP - Cardiology (Cardiology) [name], MD as Consulting Physician (Hematology) Kyung Rudd, MD as Consulting Physician (Radiation Oncology) Rolm Bookbinder, MD as Consulting Physician (General Surgery) Delice Bison Charlestine Massed, NP as Nurse Practitioner (Hematology and Oncology)   I connected with [name] on 07/20/2018 at  2:15 PM EDT by telephone visit and verified that I am speaking with the correct person using two identifiers.  I discussed the limitations, risks, security and privacy concerns of performing an evaluation and management service by telephone and the availability of in person appointments. I also discussed with the patient that there may be a patient responsible charge related to this service. The patient expressed understanding and agreed to proceed.   Patient's location:  Her home  Provider's location:  My Office   CHIEF COMPLAINT: F/u of left breast cancer  SUMMARY OF ONCOLOGIC HISTORY: Oncology History   Cancer Staging Malignant neoplasm of upper-outer quadrant of left breast in female, estrogen receptor positive ([place]) Staging form: Breast, AJCC 8th Edition - Clinical stage from 12/13/2016: Stage IB (cT2, cN0, cM0, G2, ER: Positive, PR: Positive, HER2: Negative) - Signed by [name], MD on 12/21/2016 - Pathologic stage from 01/14/2017: Stage IA (pT2, pN0, cM0, G1, ER: Positive, PR: Positive, HER2: Negative, Oncotype DX score: 4) - Signed by [name], MD on 04/17/2017       Malignant neoplasm of upper-outer quadrant of left breast in female, estrogen receptor positive ([place])   12/06/2016 Mammogram    Diagnostic Mammogram 12/06/16 IMPRESSION:  Suspicious mass in the left breast at 3 o'clock 2 cm from the nipple measuring 1.9 x 1.1 x 2.2 cm.  RECOMMENDATION: Ultrasound-guided core biopsy of the mass in the 3 o'clock region of the left breast is recommended. The biopsy will be scheduled at the patient's convenience.    12/13/2016 Initial Biopsy    Diagnosis 12/13/16 Breast, left, needle core biopsy, 3:00 o'clock, 2cmfn - INVASIVE DUCTAL CARCINOMA - SEE COMMENT    12/16/2016 Initial Diagnosis    Malignant neoplasm of upper-outer quadrant of left breast in female, estrogen receptor positive ([place])    12/17/2016 Receptors her2    Estrogen Receptor: 100%, POSITIVE, STRONG STAINING INTENSITY Progesterone Receptor: 100%, POSITIVE, STRONG STAINING INTENSITY Proliferation Marker Ki67: 30% HER2 - NEGATIVE     01/14/2017 Surgery    Left breast lumpectomy with Dr. Donne Hazel    01/14/2017 Pathology Results    Diagnosis 01/14/17 1. Breast, lumpectomy, Left - INVASIVE DUCTAL CARCINOMA, GRADE I/III, SPANNING 2.1 CM. - DUCTAL CARCINOMA IN SITU, LOW GRADE. - INVASIVE CARCINOMA IS BROADLY PRESENT AT THE INFERIOR MARGIN OF SPECIMEN 1. - DUCTAL CARCINOMA IN SITU IS FOCALLY PRESENT AT THE INFERIOR MARGIN OF SPECIMEN 1 AND BROADLY LESS THAN 0.1 CM TO THE LATERAL MARGIN OF SPECIMEN 1. - SEE ONCOLOGY TABLE BELOW. 2. Breast, excision, Additional medial margin left - DUCTAL CARCINOMA IN SITU, LOW GRADE. - DUCTAL CARCINOMA IS FOCALLY LESS THAN 0.1 CM TO THE NEW MARGIN OF SPECIMEN 2. 3. Breast, excision, Additional lateral margin left - DUCTAL CARCINOMA IN SITU, LOW GRADE. - DUCTAL CARCINOMA IN SITU IS GREATER THAN 0.2 CM TO ALL MARGINS. 4. Breast, excision, Additional superior margin left - DUCTAL CARCINOMA IN SITU, LOW GRADE. - DUCTAL CARCINOMA IN SITU IS BROADLY LESS THAN 0.1 CM TO THE NEW MARGIN OF  SPECIMEN 4. 5. Lymph node, sentinel, biopsy, Left axillary - THERE IS NO EVIDENCE OF CARCINOMA IN 1 OF 1 LYMPH NODE (0/1). 6. Breast, excision, Additional inferior margin left - DUCTAL CARCINOMA IN SITU, LOW GRADE. - DUCTAL CARCINOMA IN  SITU IS GREATER THAN 0.2 CM TO ALL MARGINS.     01/14/2017 Oncotype testing    Her oncotype recurrence score is 4 and her distance recurrent on Tamoxifen alone is 3%.    03/08/2017 - 04/05/2017 Radiation Therapy    RT with Dr. Lisbeth Renshaw     06/2017 -  Anti-estrogen oral therapy    Tamoxifen daily    12/15/2017 Mammogram    12/15/2017 Mammogram IMPRESSION: New lumpectomy site left breast. No mammographic evidence of malignancy in either breast.      CURRENT THERAPY:  Adjuvant Tamoxifen 20 mg daily started on 04/20/17   INTERVAL HISTORY:  Danielle [name] is here for a follow up of left breast cancer. She was able to identify herself by birth date. She notes she is doing well. She notes no new changes. She notes she had issues trying to refill her Tamoxifen given she is on Coumadin. Her PCP checks her coumadin level which she has been on for several years.  She notes she is tolerating Tamoxifen well overall with manageable hot flashes. She notes she goes to pain specialist for her arthritis. Tamoxifen has not worsened her arthritis.  She notes she has trouble sleeping. She notes she has tried melatonin and night time Tylenol.      REVIEW OF SYSTEMS:   Constitutional: Denies fevers, chills or abnormal weight loss (+) hot flashes manageable (+) trouble sleeping  Eyes: Denies blurriness of vision Ears, nose, mouth, throat, and face: Denies mucositis or sore throat Respiratory: Denies cough, dyspnea or wheezes Cardiovascular: Denies palpitation, chest discomfort or lower extremity swelling Gastrointestinal:  Denies nausea, heartburn or change in bowel habits Skin: Denies abnormal skin rashes Lymphatics: Denies new lymphadenopathy or easy bruising Neurological:Denies numbness, tingling or new weaknesses Behavioral/Psych: Mood is stable, no new changes  All other systems were reviewed with the patient and are negative.  MEDICAL HISTORY:  Past Medical History:  Diagnosis Date   . Allergy   . Anxiety   . Aortic stenosis    Status post bioprosthetic AVR  . Arthritis    DJD  . Asthma   . Breast cancer ([place]) 12/13/2016   Left breast  . Chronic systolic CHF (congestive heart failure) ([place])    EF 30% 04/2014  . COPD (chronic obstructive pulmonary disease) ([place])   . Coronary artery disease    per pt, had LHC prior to AVR in [place] that did not show any blockages; no stents/bypass  . Gastritis   . Hyperlipidemia   . Hypertension   . Hyperthyroidism   . Multiple thyroid nodules   . Osteoporosis   . Paroxysmal atrial fibrillation ([facility])   . Pelvis fracture ([place]) 08/19/2015   MULTIPLE   . Personal history of radiation therapy 2018  . Spontaneous pneumothorax 11/28/2015   left   . Stroke ([place]) 2000   rt hand weak    SURGICAL HISTORY: Past Surgical History:  Procedure Laterality Date  . BREAST LUMPECTOMY Left 12/2016  . BREAST LUMPECTOMY WITH RADIOACTIVE SEED AND SENTINEL LYMPH NODE BIOPSY Left 01/14/2017   Procedure: LEFT BREAST LUMPECTOMY WITH RADIOACTIVE SEED AND SENTINEL LYMPH NODE BIOPSY;  Surgeon: Rolm Bookbinder, MD;  Location: [facility];  Service: General;  Laterality: Left;  . CHEST TUBE INSERTION  10/2015  . INTRAMEDULLARY (IM) NAIL INTERTROCHANTERIC Left 12/10/2015   Procedure: INTRAMEDULLARY (IM) NAIL INTERTROCHANTRIC;  Surgeon: Leandrew Koyanagi, MD;  Location: [facility];  Service: Orthopedics;  Laterality: Left;  . PLEURADESIS Left 12/03/2015   Procedure: PLEURADESIS;  Surgeon: Melrose Nakayama, MD;  Location: [facility];  Service: Thoracic;  Laterality: Left;  . RESECTION OF APICAL BLEB Left 12/03/2015   Procedure: BLEBECTOMY;  Surgeon: Melrose Nakayama, MD;  Location: [facility];  Service: Thoracic;  Laterality: Left;  . THORACOSCOPY  12/03/2015  . VALVE REPLACEMENT    . VIDEO ASSISTED THORACOSCOPY Left 12/03/2015   Procedure: VIDEO ASSISTED THORACOSCOPY;  Surgeon: Melrose Nakayama, MD;  Location: [facility];  Service: Thoracic;  Laterality: Left;     I have reviewed the social history and family history with the patient and they are unchanged from previous note.  ALLERGIES:  is allergic to lisinopril and tetanus toxoid adsorbed.  MEDICATIONS:  Current Outpatient Medications  Medication Sig Dispense Refill  . acetaminophen (TYLENOL) 500 MG tablet Take 1,000 mg by mouth daily as needed (PAIN).    Marland Kitchen albuterol (PROVENTIL HFA;VENTOLIN HFA) 108 (90 Base) MCG/ACT inhaler Inhale into the lungs.    Marland Kitchen alendronate (FOSAMAX) 70 MG tablet TAKE  1 TABLET BY MOUTH EVERY SATURDAY WITH A FULL GLASS OF WATER ON A EMPTY STOMACH 4 tablet 0  . aspirin EC 81 MG tablet Take 81 mg daily by mouth.    . budesonide-formoterol (SYMBICORT) 160-4.5 MCG/ACT inhaler Inhale 2 puffs into the lungs 2 (two) times daily. 1 Inhaler 0  . carvedilol (COREG) 25 MG tablet Take 1 tablet by mouth twice daily 180 tablet 0  . Cholecalciferol (VITAMIN D3) 400 units tablet Take 1 tablet (400 Units total) by mouth daily. 30 tablet 0  . diclofenac sodium (VOLTAREN) 1 % GEL Apply 4 g topically 4 (four) times daily. 500 g 3  . diphenhydramine-acetaminophen (TYLENOL PM) 25-500 MG TABS tablet Take 2 tablets by mouth at bedtime as needed (SLEEP).     . furosemide (LASIX) 20 MG tablet TAKE 1 TABLET BY MOUTH DAILY 90 tablet 0  . gabapentin (NEURONTIN) 300 MG capsule Take 2 capsules (600 mg total) by mouth at bedtime. 60 capsule 5  . losartan (COZAAR) 50 MG tablet Take 1 tablet by mouth once daily 90 tablet 0  . methimazole (TAPAZOLE) 5 MG tablet TAKE 3 TABLETS BY MOUTH EVERY EVENING 90 tablet 0  . rosuvastatin (CRESTOR) 40 MG tablet Take 1 tablet (40 mg total) by mouth daily. 90 tablet 3  . senna-docusate (SENOKOT-S) 8.6-50 MG tablet Take 2 tablets at bedtime by mouth.     . tamoxifen (NOLVADEX) 20 MG tablet Take 1 tablet by mouth once daily 90 tablet 0  . traMADol (ULTRAM) 50 MG tablet Take 1 tablet (50 mg total) by mouth 3 (three) times daily. 90 tablet 5  . valACYclovir (VALTREX) 500 MG  tablet Take 1 tablet (500 mg total) by mouth 2 (two) times daily. For 3 days when outbreaks. 24 tablet 1  . warfarin (COUMADIN) 3 MG tablet TAKE AS DIRECTED BY  ANTICOAGULATION  CLINIC 40 tablet 0   No current facility-administered medications for this visit.     PHYSICAL EXAMINATION: ECOG PERFORMANCE STATUS: 1 - Symptomatic but completely ambulatory  No vitals taken today, Exam not performed today  LABORATORY DATA:  I have reviewed the data as listed CBC Latest Ref Rng & Units 01/19/2018 01/04/2018 07/04/2017  WBC 4.0 - 10.5 K/uL 6.0 5.0 5.1  Hemoglobin 12.0 - 15.0 g/dL 12.8 13.8 12.3  Hematocrit 36.0 - 46.0 % 40.2 40.8 38.1  Platelets 150 - 400 K/uL 98(L) 103.0(L) 101(L)     CMP Latest Ref Rng & Units 01/19/2018 01/04/2018 09/28/2017  Glucose 70 - 99 mg/dL 115(H) 89 90  BUN 8 - 23 mg/dL '17 17 16  ' Creatinine 0.44 - 1.00 mg/dL 1.37(H) 1.19 1.17  Sodium 135 - 145 mmol/L 142 139 142  Potassium 3.5 - 5.1 mmol/L 5.0 4.8 5.0  Chloride 98 - 111 mmol/L 110 107 109  CO2 22 - 32 mmol/L '24 26 29  ' Calcium 8.9 - 10.3 mg/dL 9.9 10.0 9.7  Total Protein 6.5 - 8.1 g/dL 7.1 - -  Total Bilirubin 0.3 - 1.2 mg/dL 0.5 - -  Alkaline Phos 38 - 126 U/L 56 - -  AST 15 - 41 U/L 15 - -  ALT 0 - 44 U/L 15 - -      RADIOGRAPHIC STUDIES: I have personally reviewed the radiological images as listed and agreed with the findings in the report. No results found.   ASSESSMENT & PLAN:  Trinadee Verhagen is a 73 y.o. female with   1. Malignant neoplasm of upper-outer quadrant of left breast, Stage IB, c(T2,N0,M0 ), ER/PR: POSITIVE, HER2: NEGATIVE, Grade II -She was diagnosed in 11/2016. She is s/p left breast lumpectomy and adjuvant radiation.  -the Oncotype Dx result was previously reviewed with her in details. She has low risk based on the recurrence score, which predicts 10 year distant recurrence after 5 years of tamoxifen 3%.  There is no benefit of adjuvant chemotherapy for low risk disease.  -She started Tamoxifen in 06/2017. Tolerating well, will continue for 5 to 10 years. Due to her severe arthritis, she may not be able to tolerate aromatase inhibitor. -I discussed there is a mild drug interaction betwwen Tamoxifen and Coumadin given Tamoxifen can increase her coumadin level. She has been on both for over a year now without issue. She follows her PCP for PT/INR check monthly. She is fine to continue and she will continue to follow up with her PCP.  -She is clinically doing well. Her weight is stable, 230 pounds, her BP is managed well. Her 11/2017 mammogram was unremarkable. There is no concern for recurrence.  -Continue Surveillance, Next mammogram 11/2018 with DEXA scan  -f/u in 4 months    2. Osteoporosis, Arthritis  -Underwent Left Hip surgery with rod placement in 2017, ambulates with walker -On Fosamax  -Next DEXA in 11/2018 -Arthritis is stable, she will continue to f/u with pain specialist.   3. CAD, Afib, CHF, Aortic Stenosis, H/o Stroke  -On Coreg, Coumadin -clinically stable  -follow-up with her primary care physician  4. Insomnia  -I recommend OTC benadryl or melatonin  -I also reviewed sleep hygiene with her.   PLAN:  -Continue Tamoxifen, refilled today, pt is aware the interaction between Tamoxifen and coumadin and will f/u with PCP for coumadin dose adjustment as needed  -Mammogram and DEXA in 11/2018 -f/u in 4 months with labs     No problem-specific Assessment & Plan notes found for this encounter.   Orders Placed This Encounter  Procedures  . MM DIAG BREAST TOMO BILATERAL    Standing Status:   Future    Standing Expiration Date:   07/20/2019    Order Specific Question:   Reason for Exam (SYMPTOM  OR DIAGNOSIS REQUIRED)    Answer:   screening    Order Specific Question:  Preferred imaging location?    Answer:   [facility]  . DG Bone Density    Standing Status:   Future    Standing Expiration Date:   07/20/2019    Order Specific  Question:   Reason for Exam (SYMPTOM  OR DIAGNOSIS REQUIRED)    Answer:   screening    Order Specific Question:   Preferred imaging location?    Answer:   [facility]   I discussed the assessment and treatment plan with the patient. The patient was provided an opportunity to ask questions and all were answered. The patient agreed with the plan and demonstrated an understanding of the instructions.  The patient was advised to call back or seek an in-person evaluation if the symptoms worsen or if the condition fails to improve as anticipated.  I provided 15 minutes of non face-to-face telephone visit time during this encounter, and > 50% was spent counseling as documented under my assessment & plan.    [name], MD 07/20/2018   I, Joslyn Devon, am acting as scribe for [name], MD.   I have reviewed the above documentation for accuracy and completeness, and I agree with the above.

## 2018-07-20 ENCOUNTER — Other Ambulatory Visit: Payer: Medicare Other

## 2018-07-20 ENCOUNTER — Encounter: Payer: Self-pay | Admitting: Hematology

## 2018-07-20 ENCOUNTER — Inpatient Hospital Stay: Payer: Medicare Other | Attending: Hematology | Admitting: Hematology

## 2018-07-20 ENCOUNTER — Telehealth: Payer: Self-pay

## 2018-07-20 DIAGNOSIS — Z923 Personal history of irradiation: Secondary | ICD-10-CM

## 2018-07-20 DIAGNOSIS — Z79899 Other long term (current) drug therapy: Secondary | ICD-10-CM

## 2018-07-20 DIAGNOSIS — Z170 Estrogen receptor positive status [ER+]: Secondary | ICD-10-CM

## 2018-07-20 DIAGNOSIS — E2839 Other primary ovarian failure: Secondary | ICD-10-CM

## 2018-07-20 DIAGNOSIS — Z79810 Long term (current) use of selective estrogen receptor modulators (SERMs): Secondary | ICD-10-CM

## 2018-07-20 DIAGNOSIS — C50412 Malignant neoplasm of upper-outer quadrant of left female breast: Secondary | ICD-10-CM

## 2018-07-20 DIAGNOSIS — I10 Essential (primary) hypertension: Secondary | ICD-10-CM

## 2018-07-20 DIAGNOSIS — Z7982 Long term (current) use of aspirin: Secondary | ICD-10-CM

## 2018-07-20 NOTE — Progress Notes (Signed)
Spoke with pharmacist at [facility] on file regarding Tamoxifen and interaction with Coumadin.  Per pharmacist the Tamoxifen is ready to be picked up.  Called patient to let her know.  She verbalized an understanding.

## 2018-07-20 NOTE — Telephone Encounter (Signed)
Verified with [facility] pharmacy to find out if they can fill the Tamoxifen.  According to the pharmacist is ready for pick up.  Notified patient.

## 2018-07-26 ENCOUNTER — Telehealth: Payer: Self-pay | Admitting: Hematology

## 2018-07-26 ENCOUNTER — Ambulatory Visit: Payer: Medicare Other

## 2018-07-26 NOTE — Telephone Encounter (Signed)
Scheduled appt per sch msg. Called and left msg for patient. Mailed printout.

## 2018-07-31 ENCOUNTER — Other Ambulatory Visit: Payer: Self-pay | Admitting: Family Medicine

## 2018-07-31 ENCOUNTER — Other Ambulatory Visit: Payer: Self-pay

## 2018-07-31 ENCOUNTER — Ambulatory Visit (INDEPENDENT_AMBULATORY_CARE_PROVIDER_SITE_OTHER): Payer: Medicare Other | Admitting: General Practice

## 2018-07-31 DIAGNOSIS — Z7901 Long term (current) use of anticoagulants: Secondary | ICD-10-CM | POA: Diagnosis not present

## 2018-07-31 DIAGNOSIS — I480 Paroxysmal atrial fibrillation: Secondary | ICD-10-CM

## 2018-07-31 LAB — POCT INR: INR: 3.6 — AB (ref 2.0–3.0)

## 2018-07-31 NOTE — Patient Instructions (Incomplete)
Pre visit review using our clinic review tool, if applicable. No additional management support is needed unless otherwise documented below in the visit note.  Hold coumadin today (6/1) and decrease dosage to 1 tablet daily except 1/2 tablet every Wednesday.  Re-check in 4 weeks.

## 2018-08-03 ENCOUNTER — Telehealth: Payer: Self-pay | Admitting: Family Medicine

## 2018-08-03 NOTE — Telephone Encounter (Signed)
Copied from [place] [id number]. Topic: Quick Communication - Rx Refill/Question >> Aug 03, 2018  3:46 PM Blase Mess A wrote: Medication: warfarin (COUMADIN) 3 MG tablet [[id number]] , furosemide (LASIX) 20 MG tablet [[id number]]   Has the patient contacted their pharmacy? Yes  (Agent: If no, request that the patient contact the pharmacy for the refill.) (Agent: If yes, when and what did the pharmacy advise?)  Preferred Pharmacy (with phone number or street name): , [place] - [address] [phone] (Phone) [phone] (Fax)    Agent: Please be advised that RX refills may take up to 3 business days. We ask that you follow-up with your pharmacy.

## 2018-08-04 ENCOUNTER — Encounter: Payer: Self-pay | Admitting: Family Medicine

## 2018-08-04 ENCOUNTER — Other Ambulatory Visit: Payer: Self-pay

## 2018-08-04 ENCOUNTER — Ambulatory Visit (INDEPENDENT_AMBULATORY_CARE_PROVIDER_SITE_OTHER): Payer: Medicare Other | Admitting: Family Medicine

## 2018-08-04 VITALS — BP 116/90 | HR 82

## 2018-08-04 DIAGNOSIS — Z7901 Long term (current) use of anticoagulants: Secondary | ICD-10-CM

## 2018-08-04 DIAGNOSIS — N183 Chronic kidney disease, stage 3 unspecified: Secondary | ICD-10-CM

## 2018-08-04 DIAGNOSIS — I480 Paroxysmal atrial fibrillation: Secondary | ICD-10-CM | POA: Diagnosis not present

## 2018-08-04 DIAGNOSIS — I10 Essential (primary) hypertension: Secondary | ICD-10-CM

## 2018-08-04 DIAGNOSIS — E0590 Thyrotoxicosis, unspecified without thyrotoxic crisis or storm: Secondary | ICD-10-CM | POA: Diagnosis not present

## 2018-08-04 MED ORDER — FUROSEMIDE 20 MG PO TABS: 20.0000 mg | ORAL_TABLET | Freq: Every day | ORAL | 2 refills | Status: DC

## 2018-08-04 MED ORDER — WARFARIN SODIUM 1 MG PO TABS: ORAL_TABLET | ORAL | 2 refills | Status: DC

## 2018-08-04 MED ORDER — WARFARIN SODIUM 3 MG PO TABS: ORAL_TABLET | ORAL | 1 refills | Status: DC

## 2018-08-04 MED ORDER — METHIMAZOLE 5 MG PO TABS: 15.0000 mg | ORAL_TABLET | Freq: Every evening | ORAL | 2 refills | Status: DC

## 2018-08-04 NOTE — Progress Notes (Signed)
Virtual Visit via Telephone Note  I connected with Danielle [name] on 08/04/18 at  3:15 PM EDT by telephone and verified that I am speaking with the correct person using two identifiers.   I discussed the limitations, risks, security and privacy concerns of performing an evaluation and management service by telephone and the availability of in person appointments. I also discussed with the patient that there may be a patient responsible charge related to this service. The patient expressed understanding and agreed to proceed.  Location patient: home Location provider: work office Participants present for the call: patient, provider Patient did not have a visit in the prior 7 days to address this/these issue(s).   History of Present Illness: Ms Danielle [name] is a 73 yo female with Hx of breast cancer,CHF, hyperlipidemia, osteoporosis, CKD, and chronic anticoagulation among some. Since her last visit she has follow-up with oncologist, Dr. Annamaria Boots.  Paroxysmal atrial fibrillation history of aortic valve replacement: Currently she is on Coumadin 3 mg x 6 days and 1.5 mg on Wednesdays. INR goal 2-3. Last INR supratherapeutic, 3.6, Coumadin was adjusted. She is following with Coumadin clinic. She has not noted nose/gum bleeding, blood in the stool, blood in the urine, or more bruising than usual.   She would like to have a Coumadin 1 mg to take on Wednesdays instead splitting the Coumadin 3 mg tablet.  Hypertension + CKD 3, currently she is on losartan 50 mg daily and Coreg 25 mg twice daily. Negative for CP, visual changes, or worsening edema. She checks BP periodically, "it is good."  Lab Results  Component Value Date   CREATININE 1.37 (H) 01/19/2018   BUN 17 01/19/2018   NA 142 01/19/2018   K 5.0 01/19/2018   CL 110 01/19/2018   CO2 24 01/19/2018    CHF, LVEF 35-40% (echo 12/2016).  She denies dyspnea, orthopnea, or PND. CVA x 2.  She takes furosemide 20 mg  daily. She follows with cardiologist regularly, last visit on 06/07/2018.  Hypothyroidism, currently she is on methimazole 15 mg daily at night. She has not noted headache, palpitations, tremors, changes in bowel habits, or abnormal weight loss. Lab Results  Component Value Date   TSH 2.39 01/04/2018    Observations/Objective: Patient sounds cheerful and well on the phone. I do not appreciate any SOB. Speech and thought processing are grossly intact. Patient reported vitals:BP 116/90   Pulse 82   Assessment and Plan:  -CKD 3 has been stable,Cr 1.14-1.37, eGFR high 50's. Continue losartan 50 mg daily, avoiding NSAIDs, adequate hydration, and low-salt diet.  -Hypertension, BP is adequately controlled. No changes in current management. Continue monitoring BP periodically.  -Atrial fibrillation and CHF, continue following with cardiologist. No changes in furosemide, Coreg, and losartan dose.  -Chronic anticoagulation, last INR supratherapeutic, Coumadin dose has been adjusted. No changes in Coumadin dose, keep next Coumadin appointment. Coumadin 1 mg was sent to her pharmacy to continue taking it with this, rest of days to continue with 3 mg.  -Hypothyroidism, she does not feel comfortable coming to the clinic for lab work. TSH has been stable for a few years, so we can recheck TSH during next visit. No changes in methimazole dose.   Follow Up Instructions:  F/U in 6 months.  I did not refer this patient for an OV in the next 24 hours for this/these issue(s).  I discussed the assessment and treatment plan with the patient. She was provided an opportunity to ask questions and all were answered. The  patient agreed with the plan and demonstrated an understanding of the instructions.   The patient was advised to call back or seek an in-person evaluation if the symptoms worsen or if the condition fails to improve as anticipated.  I provided 15 minutes of non-face-to-face time  during this encounter.   Alayla Dethlefs [name], MD

## 2018-08-04 NOTE — Telephone Encounter (Signed)
Patient scheduled telephone visit for medication refills today at 3:15 pm.

## 2018-08-08 ENCOUNTER — Encounter: Payer: Self-pay | Admitting: Nurse Practitioner

## 2018-08-08 ENCOUNTER — Other Ambulatory Visit: Payer: Self-pay

## 2018-08-08 ENCOUNTER — Telehealth: Payer: Self-pay | Admitting: *Deleted

## 2018-08-08 ENCOUNTER — Inpatient Hospital Stay: Payer: Medicare Other | Attending: Hematology | Admitting: Nurse Practitioner

## 2018-08-08 ENCOUNTER — Telehealth: Payer: Self-pay | Admitting: Nurse Practitioner

## 2018-08-08 VITALS — BP 119/73 | HR 82 | Temp 99.1°F | Resp 17 | Ht 67.0 in | Wt 248.9 lb

## 2018-08-08 DIAGNOSIS — I350 Nonrheumatic aortic (valve) stenosis: Secondary | ICD-10-CM | POA: Diagnosis not present

## 2018-08-08 DIAGNOSIS — Z79899 Other long term (current) drug therapy: Secondary | ICD-10-CM | POA: Diagnosis not present

## 2018-08-08 DIAGNOSIS — Z7901 Long term (current) use of anticoagulants: Secondary | ICD-10-CM | POA: Diagnosis not present

## 2018-08-08 DIAGNOSIS — I2510 Atherosclerotic heart disease of native coronary artery without angina pectoris: Secondary | ICD-10-CM | POA: Diagnosis not present

## 2018-08-08 DIAGNOSIS — D696 Thrombocytopenia, unspecified: Secondary | ICD-10-CM | POA: Insufficient documentation

## 2018-08-08 DIAGNOSIS — Z170 Estrogen receptor positive status [ER+]: Secondary | ICD-10-CM | POA: Diagnosis not present

## 2018-08-08 DIAGNOSIS — I5022 Chronic systolic (congestive) heart failure: Secondary | ICD-10-CM | POA: Insufficient documentation

## 2018-08-08 DIAGNOSIS — I110 Hypertensive heart disease with heart failure: Secondary | ICD-10-CM

## 2018-08-08 DIAGNOSIS — I4891 Unspecified atrial fibrillation: Secondary | ICD-10-CM | POA: Diagnosis not present

## 2018-08-08 DIAGNOSIS — F419 Anxiety disorder, unspecified: Secondary | ICD-10-CM | POA: Diagnosis not present

## 2018-08-08 DIAGNOSIS — C50412 Malignant neoplasm of upper-outer quadrant of left female breast: Secondary | ICD-10-CM | POA: Diagnosis not present

## 2018-08-08 DIAGNOSIS — N61 Mastitis without abscess: Secondary | ICD-10-CM | POA: Diagnosis not present

## 2018-08-08 DIAGNOSIS — J449 Chronic obstructive pulmonary disease, unspecified: Secondary | ICD-10-CM

## 2018-08-08 DIAGNOSIS — Z923 Personal history of irradiation: Secondary | ICD-10-CM

## 2018-08-08 DIAGNOSIS — Z7982 Long term (current) use of aspirin: Secondary | ICD-10-CM | POA: Insufficient documentation

## 2018-08-08 DIAGNOSIS — M810 Age-related osteoporosis without current pathological fracture: Secondary | ICD-10-CM | POA: Insufficient documentation

## 2018-08-08 DIAGNOSIS — Z79810 Long term (current) use of selective estrogen receptor modulators (SERMs): Secondary | ICD-10-CM | POA: Insufficient documentation

## 2018-08-08 DIAGNOSIS — E785 Hyperlipidemia, unspecified: Secondary | ICD-10-CM | POA: Diagnosis not present

## 2018-08-08 MED ORDER — DOXYCYCLINE HYCLATE 100 MG PO TABS: 100.0000 mg | ORAL_TABLET | Freq: Two times a day (BID) | ORAL | 0 refills | Status: DC

## 2018-08-08 NOTE — Telephone Encounter (Signed)
Scheduled appt per 6/9 los. Left a voice message of appt date and time. °

## 2018-08-08 NOTE — Telephone Encounter (Signed)
Received ph call from pt stating that her L breast is red & hot to touch.  She had lumpectomy & radiation last year & is on tamoxifen.  She just noticed this yesterday.  She has not done any warm compresses.  Discussed with Lacie NP & she will see pt this am.  Scheduling message sent.

## 2018-08-08 NOTE — Progress Notes (Addendum)
[place]   Telephone:(336) [phone] [phone]   Clinic Follow up Note   Patient Care Team: [name], [name], MD as PCP - General (Family Medicine) End, Harrell Gave, MD as PCP - Cardiology (Cardiology) [name], MD as Consulting Physician (Hematology) Kyung Rudd, MD as Consulting Physician (Radiation Oncology) Rolm Bookbinder, MD as Consulting Physician (General Surgery) Gardenia Phlegm, NP as Nurse Practitioner (Hematology and Oncology) 08/08/2018  CHIEF COMPLAINT: left breast with redness and warmth   SUMMARY OF ONCOLOGIC HISTORY: Oncology History   Cancer Staging Malignant neoplasm of upper-outer quadrant of left breast in female, estrogen receptor positive ([place]) Staging form: Breast, AJCC 8th Edition - Clinical stage from 12/13/2016: Stage IB (cT2, cN0, cM0, G2, ER: Positive, PR: Positive, HER2: Negative) - Signed by [name], MD on 12/21/2016 - Pathologic stage from 01/14/2017: Stage IA (pT2, pN0, cM0, G1, ER: Positive, PR: Positive, HER2: Negative, Oncotype DX score: 4) - Signed by [name], MD on 04/17/2017       Malignant neoplasm of upper-outer quadrant of left breast in female, estrogen receptor positive ([place])   12/06/2016 Mammogram    Diagnostic Mammogram 12/06/16 IMPRESSION:  Suspicious mass in the left breast at 3 o'clock 2 cm from the nipple measuring 1.9 x 1.1 x 2.2 cm. RECOMMENDATION: Ultrasound-guided core biopsy of the mass in the 3 o'clock region of the left breast is recommended. The biopsy will be scheduled at the patient's convenience.    12/13/2016 Initial Biopsy    Diagnosis 12/13/16 Breast, left, needle core biopsy, 3:00 o'clock, 2cmfn - INVASIVE DUCTAL CARCINOMA - SEE COMMENT    12/16/2016 Initial Diagnosis    Malignant neoplasm of upper-outer quadrant of left breast in female, estrogen receptor positive ([place])    12/17/2016 Receptors her2    Estrogen Receptor: 100%, POSITIVE, STRONG STAINING INTENSITY  Progesterone Receptor: 100%, POSITIVE, STRONG STAINING INTENSITY Proliferation Marker Ki67: 30% HER2 - NEGATIVE     01/14/2017 Surgery    Left breast lumpectomy with Dr. Donne Hazel    01/14/2017 Pathology Results    Diagnosis 01/14/17 1. Breast, lumpectomy, Left - INVASIVE DUCTAL CARCINOMA, GRADE I/III, SPANNING 2.1 CM. - DUCTAL CARCINOMA IN SITU, LOW GRADE. - INVASIVE CARCINOMA IS BROADLY PRESENT AT THE INFERIOR MARGIN OF SPECIMEN 1. - DUCTAL CARCINOMA IN SITU IS FOCALLY PRESENT AT THE INFERIOR MARGIN OF SPECIMEN 1 AND BROADLY LESS THAN 0.1 CM TO THE LATERAL MARGIN OF SPECIMEN 1. - SEE ONCOLOGY TABLE BELOW. 2. Breast, excision, Additional medial margin left - DUCTAL CARCINOMA IN SITU, LOW GRADE. - DUCTAL CARCINOMA IS FOCALLY LESS THAN 0.1 CM TO THE NEW MARGIN OF SPECIMEN 2. 3. Breast, excision, Additional lateral margin left - DUCTAL CARCINOMA IN SITU, LOW GRADE. - DUCTAL CARCINOMA IN SITU IS GREATER THAN 0.2 CM TO ALL MARGINS. 4. Breast, excision, Additional superior margin left - DUCTAL CARCINOMA IN SITU, LOW GRADE. - DUCTAL CARCINOMA IN SITU IS BROADLY LESS THAN 0.1 CM TO THE NEW MARGIN OF SPECIMEN 4. 5. Lymph node, sentinel, biopsy, Left axillary - THERE IS NO EVIDENCE OF CARCINOMA IN 1 OF 1 LYMPH NODE (0/1). 6. Breast, excision, Additional inferior margin left - DUCTAL CARCINOMA IN SITU, LOW GRADE. - DUCTAL CARCINOMA IN SITU IS GREATER THAN 0.2 CM TO ALL MARGINS.     01/14/2017 Oncotype testing    Her oncotype recurrence score is 4 and her distance recurrent on Tamoxifen alone is 3%.    03/08/2017 - 04/05/2017 Radiation Therapy    RT with Dr. Lisbeth Renshaw  06/2017 -  Anti-estrogen oral therapy    Tamoxifen daily    12/15/2017 Mammogram    12/15/2017 Mammogram IMPRESSION: New lumpectomy site left breast. No mammographic evidence of malignancy in either breast.     CURRENT THERAPY: breast cancer surveillance, adjuvant tamoxifen daily since 07/18/2017   INTERVAL HISTORY:  Danielle [name] presents today for unscheduled visit with left breast concern. Last f/u was virtual visit with Dr. Burr Medico on 07/20/18. One day ago she developed whole left breast redness and warmth. Breast is slightly swollen at baseline from radiation. She continues tamoxifen, no missed doses. She denies fever, chills, pain, nipple discharge, or new lump. She denies injury, fall, insect bite/wound, new detergent or product. She took 1 "keto boost" supplement and then symptoms appeared shortly after.    MEDICAL HISTORY:  Past Medical History:  Diagnosis Date  . Allergy   . Anxiety   . Aortic stenosis    Status post bioprosthetic AVR  . Arthritis    DJD  . Asthma   . Breast cancer ([place]) 12/13/2016   Left breast  . Chronic systolic CHF (congestive heart failure) ([place])    EF 30% 04/2014  . COPD (chronic obstructive pulmonary disease) ([place])   . Coronary artery disease    per pt, had LHC prior to AVR in [place] that did not show any blockages; no stents/bypass  . Gastritis   . Hyperlipidemia   . Hypertension   . Hyperthyroidism   . Multiple thyroid nodules   . Osteoporosis   . Paroxysmal atrial fibrillation ([facility])   . Pelvis fracture ([place]) 08/19/2015   MULTIPLE   . Personal history of radiation therapy 2018  . Spontaneous pneumothorax 11/28/2015   left   . Stroke ([place]) 2000   rt hand weak    SURGICAL HISTORY: Past Surgical History:  Procedure Laterality Date  . BREAST LUMPECTOMY Left 12/2016  . BREAST LUMPECTOMY WITH RADIOACTIVE SEED AND SENTINEL LYMPH NODE BIOPSY Left 01/14/2017   Procedure: LEFT BREAST LUMPECTOMY WITH RADIOACTIVE SEED AND SENTINEL LYMPH NODE BIOPSY;  Surgeon: Rolm Bookbinder, MD;  Location: [facility] [facility];  Service: General;  Laterality: Left;  . CHEST TUBE INSERTION  10/2015  . INTRAMEDULLARY (IM) NAIL INTERTROCHANTERIC Left 12/10/2015   Procedure: INTRAMEDULLARY (IM) NAIL INTERTROCHANTRIC;  Surgeon: Leandrew Koyanagi, MD;  Location: [facility];  Service: Orthopedics;   Laterality: Left;  . PLEURADESIS Left 12/03/2015   Procedure: PLEURADESIS;  Surgeon: Melrose Nakayama, MD;  Location: [facility];  Service: Thoracic;  Laterality: Left;  . RESECTION OF APICAL BLEB Left 12/03/2015   Procedure: BLEBECTOMY;  Surgeon: Melrose Nakayama, MD;  Location: [facility];  Service: Thoracic;  Laterality: Left;  . THORACOSCOPY  12/03/2015  . VALVE REPLACEMENT    . VIDEO ASSISTED THORACOSCOPY Left 12/03/2015   Procedure: VIDEO ASSISTED THORACOSCOPY;  Surgeon: Melrose Nakayama, MD;  Location: [facility];  Service: Thoracic;  Laterality: Left;    I have reviewed the social history and family history with the patient and they are unchanged from previous note.  ALLERGIES:  is allergic to lisinopril and tetanus toxoid adsorbed.  MEDICATIONS:  Current Outpatient Medications  Medication Sig Dispense Refill  . acetaminophen (TYLENOL) 500 MG tablet Take 1,000 mg by mouth daily as needed (PAIN).    Marland Kitchen albuterol (PROVENTIL HFA;VENTOLIN HFA) 108 (90 Base) MCG/ACT inhaler Inhale into the lungs.    Marland Kitchen alendronate (FOSAMAX) 70 MG tablet TAKE  1 TABLET BY MOUTH EVERY SATURDAY WITH A FULL GLASS OF WATER ON A EMPTY  STOMACH 4 tablet 0  . aspirin EC 81 MG tablet Take 81 mg daily by mouth.    . budesonide-formoterol (SYMBICORT) 160-4.5 MCG/ACT inhaler Inhale 2 puffs into the lungs 2 (two) times daily. 1 Inhaler 0  . carvedilol (COREG) 25 MG tablet Take 1 tablet by mouth twice daily 180 tablet 0  . Cholecalciferol (VITAMIN D3) 400 units tablet Take 1 tablet (400 Units total) by mouth daily. 30 tablet 0  . diclofenac sodium (VOLTAREN) 1 % GEL Apply 4 g topically 4 (four) times daily. 500 g 3  . diphenhydramine-acetaminophen (TYLENOL PM) 25-500 MG TABS tablet Take 2 tablets by mouth at bedtime as needed (SLEEP).     . furosemide (LASIX) 20 MG tablet Take 1 tablet (20 mg total) by mouth daily. 90 tablet 2  . gabapentin (NEURONTIN) 300 MG capsule Take 2 capsules (600 mg total) by mouth at bedtime. 60  capsule 5  . losartan (COZAAR) 50 MG tablet Take 1 tablet by mouth once daily 90 tablet 0  . methimazole (TAPAZOLE) 5 MG tablet Take 3 tablets (15 mg total) by mouth every evening. 90 tablet 2  . rosuvastatin (CRESTOR) 40 MG tablet Take 1 tablet (40 mg total) by mouth daily. 90 tablet 3  . senna-docusate (SENOKOT-S) 8.6-50 MG tablet Take 2 tablets at bedtime by mouth.     . tamoxifen (NOLVADEX) 20 MG tablet Take 1 tablet by mouth once daily 90 tablet 0  . traMADol (ULTRAM) 50 MG tablet Take 1 tablet (50 mg total) by mouth 3 (three) times daily. 90 tablet 5  . valACYclovir (VALTREX) 500 MG tablet Take 1 tablet (500 mg total) by mouth 2 (two) times daily. For 3 days when outbreaks. 24 tablet 1  . warfarin (COUMADIN) 1 MG tablet 1 tab wednesdays 20 tablet 2  . warfarin (COUMADIN) 3 MG tablet TAKE AS DIRECTED BY  ANTICOAGULATION  CLINIC 90 tablet 1  . doxycycline (VIBRA-TABS) 100 MG tablet Take 1 tablet (100 mg total) by mouth 2 (two) times daily. 14 tablet 0   No current facility-administered medications for this visit.     PHYSICAL EXAMINATION:  Vitals:   08/08/18 1043  BP: 119/73  Pulse: 82  Resp: 17  Temp: 99.1 F (37.3 C)  SpO2: 98%   Filed Weights   08/08/18 1043  Weight: 248 lb 14.4 oz (112.9 kg)    GENERAL:alert, no distress and comfortable SKIN: no obvious rash EYES:  sclera clear LYMPH:  no palpable cervical, supraclavicular, or axillary lymphadenopathy  LUNGS: respirations even and unlabored  Musculoskeletal:no cyanosis  NEURO: alert & oriented x 3 with fluent speech Breast: s/p left breast lumpectomy and radiation. Left breast edematous with diffuse erythema and warmth. Soft tissue firmness to left outer breast. No discrete mass in left breast or either axilla. No nodularity to surgical scar.  Erythema and slight edema extends to lateral and posterior chest wall          LABORATORY DATA:  None for this visit   RADIOGRAPHIC STUDIES: I have personally  reviewed the radiological images as listed and agreed with the findings in the report. No results found.   ASSESSMENT & PLAN: 73 year old female with h/o   Malignant neoplasm of upper-outer quadrant of left breast, Stage IB, c(T2,N0,M0 ), ER/PR: POSITIVE, HER2: NEGATIVE, Grade II  1. Mastitis/cellulitis of left breast and chest wall [name] presents with diffuse breast and chest wall erythema and warmth. She is afebrile. She is at risk for cellulitis/mastitis due to  previous surgery and adjuvant radiation. Will treat infection with oral antibiotics. She has other comorbidities and is on coumadin. She will take doxycycline 100 mg BID x7 days. I will send a message to Dr. [name] who manages coumadin and checks INR. She will monitor for bleeding. I strongly encouraged her to call us back with fever, chills, or if redness increases/spreads. She will return in 2 days to ensure infection is improving. If she develops fever or shaking chills in the interim, she will need to go to ER for IV antibiotics, she understands. The patient was seen with Dr. Benay Spice.   2. 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 Oncotype 4 S/p left breast lumpectomy and adjuvant radiation and tamoxifen in 2018 - 2019. This does not appear to represent a typical drug allergy. I do not feel her symptoms represent local cancer recurrence or allergic reaction to tamoxifen or other medication. Continue tamoxifen. Next mammogram due 11/2018 with DEXA.   All questions were answered. The patient knows to call the clinic with any problems, questions or concerns. No barriers to learning was detected.     Alla Feeling, NP 08/08/18  This was a shared visit with Hilma Favors.  Ms. Posas and examined.  She appears to have an extensive cellulitis over the left breast and chest wall.  She does not appear systemically ill.  We prescribed  doxycycline as she is maintained on Coumadin anticoagulation.  She will need the PT/INR monitored within the next week.  She will return for a follow-up visit in 2 days.  Julieanne Manson, MD

## 2018-08-09 ENCOUNTER — Other Ambulatory Visit: Payer: Self-pay | Admitting: Nurse Practitioner

## 2018-08-09 DIAGNOSIS — N61 Mastitis without abscess: Secondary | ICD-10-CM

## 2018-08-09 NOTE — Progress Notes (Addendum)
[place]   Telephone:(336) [phone] Fax:(336) [phone]   Clinic Follow up Note   Patient Care Team: [name], [name], MD as PCP - General (Family Medicine) End, Harrell Gave, MD as PCP - Cardiology (Cardiology) [name], MD as Consulting Physician (Hematology) Kyung Rudd, MD as Consulting Physician (Radiation Oncology) Rolm Bookbinder, MD as Consulting Physician (General Surgery) Gardenia Phlegm, NP as Nurse Practitioner (Hematology and Oncology) 08/10/2018  CHIEF COMPLAINT: f/u cellulitis of left breast and chest wall   SUMMARY OF ONCOLOGIC HISTORY: Oncology History Overview Note  Cancer Staging Malignant neoplasm of upper-outer quadrant of left breast in female, estrogen receptor positive ([place]) Staging form: Breast, AJCC 8th Edition - Clinical stage from 12/13/2016: Stage IB (cT2, cN0, cM0, G2, ER: Positive, PR: Positive, HER2: Negative) - Signed by [name], MD on 12/21/2016 - Pathologic stage from 01/14/2017: Stage IA (pT2, pN0, cM0, G1, ER: Positive, PR: Positive, HER2: Negative, Oncotype DX score: 4) - Signed by [name], MD on 04/17/2017     Malignant neoplasm of upper-outer quadrant of left breast in female, estrogen receptor positive ([place])  12/06/2016 Mammogram   Diagnostic Mammogram 12/06/16 IMPRESSION:  Suspicious mass in the left breast at 3 o'clock 2 cm from the nipple measuring 1.9 x 1.1 x 2.2 cm. RECOMMENDATION: Ultrasound-guided core biopsy of the mass in the 3 o'clock region of the left breast is recommended. The biopsy will be scheduled at the patient's convenience.   12/13/2016 Initial Biopsy   Diagnosis 12/13/16 Breast, left, needle core biopsy, 3:00 o'clock, 2cmfn - INVASIVE DUCTAL CARCINOMA - SEE COMMENT   12/16/2016 Initial Diagnosis   Malignant neoplasm of upper-outer quadrant of left breast in female, estrogen receptor positive ([place])   12/17/2016 Receptors her2   Estrogen Receptor: 100%, POSITIVE, STRONG STAINING  INTENSITY Progesterone Receptor: 100%, POSITIVE, STRONG STAINING INTENSITY Proliferation Marker Ki67: 30% HER2 - NEGATIVE    01/14/2017 Surgery   Left breast lumpectomy with Dr. Donne Hazel   01/14/2017 Pathology Results   Diagnosis 01/14/17 1. Breast, lumpectomy, Left - INVASIVE DUCTAL CARCINOMA, GRADE I/III, SPANNING 2.1 CM. - DUCTAL CARCINOMA IN SITU, LOW GRADE. - INVASIVE CARCINOMA IS BROADLY PRESENT AT THE INFERIOR MARGIN OF SPECIMEN 1. - DUCTAL CARCINOMA IN SITU IS FOCALLY PRESENT AT THE INFERIOR MARGIN OF SPECIMEN 1 AND BROADLY LESS THAN 0.1 CM TO THE LATERAL MARGIN OF SPECIMEN 1. - SEE ONCOLOGY TABLE BELOW. 2. Breast, excision, Additional medial margin left - DUCTAL CARCINOMA IN SITU, LOW GRADE. - DUCTAL CARCINOMA IS FOCALLY LESS THAN 0.1 CM TO THE NEW MARGIN OF SPECIMEN 2. 3. Breast, excision, Additional lateral margin left - DUCTAL CARCINOMA IN SITU, LOW GRADE. - DUCTAL CARCINOMA IN SITU IS GREATER THAN 0.2 CM TO ALL MARGINS. 4. Breast, excision, Additional superior margin left - DUCTAL CARCINOMA IN SITU, LOW GRADE. - DUCTAL CARCINOMA IN SITU IS BROADLY LESS THAN 0.1 CM TO THE NEW MARGIN OF SPECIMEN 4. 5. Lymph node, sentinel, biopsy, Left axillary - THERE IS NO EVIDENCE OF CARCINOMA IN 1 OF 1 LYMPH NODE (0/1). 6. Breast, excision, Additional inferior margin left - DUCTAL CARCINOMA IN SITU, LOW GRADE. - DUCTAL CARCINOMA IN SITU IS GREATER THAN 0.2 CM TO ALL MARGINS.    01/14/2017 Oncotype testing   Her oncotype recurrence score is 4 and her distance recurrent on Tamoxifen alone is 3%.   03/08/2017 - 04/05/2017 Radiation Therapy   RT with Dr. Lisbeth Renshaw    06/2017 -  Anti-estrogen oral therapy   Tamoxifen daily   12/15/2017 Mammogram  12/15/2017 Mammogram IMPRESSION: New lumpectomy site left breast. No mammographic evidence of malignancy in either breast.     INTERVAL HISTORY: Danielle [name] returns for f/u left breast cellulitis. She notes itching, redness, and warmth  have improved. She has been taking doxycyline 100 mg, 2 tabs BID rather than 1 tab BID since 6/9, she only has 4 tabs left. She continues tamoxifen and continues regular coumadin dose which is 3 mg daily except Wednesday she takes 1 mg. Managed by Dr. [name]. She denies bleeding. Denies leg/calf pain or swelling.     MEDICAL HISTORY:  Past Medical History:  Diagnosis Date  . Allergy   . Anxiety   . Aortic stenosis    Status post bioprosthetic AVR  . Arthritis    DJD  . Asthma   . Breast cancer ([place]) 12/13/2016   Left breast  . Chronic systolic CHF (congestive heart failure) ([place])    EF 30% 04/2014  . COPD (chronic obstructive pulmonary disease) ([place])   . Coronary artery disease    per pt, had LHC prior to AVR in [place] that did not show any blockages; no stents/bypass  . Gastritis   . Hyperlipidemia   . Hypertension   . Hyperthyroidism   . Multiple thyroid nodules   . Osteoporosis   . Paroxysmal atrial fibrillation ([facility])   . Pelvis fracture ([place]) 08/19/2015   MULTIPLE   . Personal history of radiation therapy 2018  . Spontaneous pneumothorax 11/28/2015   left   . Stroke ([place]) 2000   rt hand weak    SURGICAL HISTORY: Past Surgical History:  Procedure Laterality Date  . BREAST LUMPECTOMY Left 12/2016  . BREAST LUMPECTOMY WITH RADIOACTIVE SEED AND SENTINEL LYMPH NODE BIOPSY Left 01/14/2017   Procedure: LEFT BREAST LUMPECTOMY WITH RADIOACTIVE SEED AND SENTINEL LYMPH NODE BIOPSY;  Surgeon: Rolm Bookbinder, MD;  Location: [facility];  Service: General;  Laterality: Left;  . CHEST TUBE INSERTION  10/2015  . INTRAMEDULLARY (IM) NAIL INTERTROCHANTERIC Left 12/10/2015   Procedure: INTRAMEDULLARY (IM) NAIL INTERTROCHANTRIC;  Surgeon: Leandrew Koyanagi, MD;  Location: [facility];  Service: Orthopedics;  Laterality: Left;  . PLEURADESIS Left 12/03/2015   Procedure: PLEURADESIS;  Surgeon: Melrose Nakayama, MD;  Location: [facility];  Service: Thoracic;  Laterality: Left;  . RESECTION OF  APICAL BLEB Left 12/03/2015   Procedure: BLEBECTOMY;  Surgeon: Melrose Nakayama, MD;  Location: [facility];  Service: Thoracic;  Laterality: Left;  . THORACOSCOPY  12/03/2015  . VALVE REPLACEMENT    . VIDEO ASSISTED THORACOSCOPY Left 12/03/2015   Procedure: VIDEO ASSISTED THORACOSCOPY;  Surgeon: Melrose Nakayama, MD;  Location: [facility];  Service: Thoracic;  Laterality: Left;    I have reviewed the social history and family history with the patient and they are unchanged from previous note.  ALLERGIES:  is allergic to lisinopril and tetanus toxoid adsorbed.  MEDICATIONS:  Current Outpatient Medications  Medication Sig Dispense Refill  . acetaminophen (TYLENOL) 500 MG tablet Take 1,000 mg by mouth daily as needed (PAIN).    Marland Kitchen albuterol (PROVENTIL HFA;VENTOLIN HFA) 108 (90 Base) MCG/ACT inhaler Inhale into the lungs.    Marland Kitchen alendronate (FOSAMAX) 70 MG tablet TAKE  1 TABLET BY MOUTH EVERY SATURDAY WITH A FULL GLASS OF WATER ON A EMPTY STOMACH 4 tablet 0  . aspirin EC 81 MG tablet Take 81 mg daily by mouth.    . budesonide-formoterol (SYMBICORT) 160-4.5 MCG/ACT inhaler Inhale 2 puffs into the lungs 2 (two) times daily. 1 Inhaler  0  . carvedilol (COREG) 25 MG tablet Take 1 tablet by mouth twice daily 180 tablet 0  . Cholecalciferol (VITAMIN D3) 400 units tablet Take 1 tablet (400 Units total) by mouth daily. 30 tablet 0  . diclofenac sodium (VOLTAREN) 1 % GEL Apply 4 g topically 4 (four) times daily. 500 g 3  . diphenhydramine-acetaminophen (TYLENOL PM) 25-500 MG TABS tablet Take 2 tablets by mouth at bedtime as needed (SLEEP).     Marland Kitchen doxycycline (VIBRA-TABS) 100 MG tablet Take 1 tablet (100 mg total) by mouth 2 (two) times daily. 6 tablet 0  . furosemide (LASIX) 20 MG tablet Take 1 tablet (20 mg total) by mouth daily. 90 tablet 2  . gabapentin (NEURONTIN) 300 MG capsule Take 2 capsules (600 mg total) by mouth at bedtime. 60 capsule 5  . losartan (COZAAR) 50 MG tablet Take 1 tablet by mouth once  daily 90 tablet 0  . methimazole (TAPAZOLE) 5 MG tablet Take 3 tablets (15 mg total) by mouth every evening. 90 tablet 2  . rosuvastatin (CRESTOR) 40 MG tablet Take 1 tablet (40 mg total) by mouth daily. 90 tablet 3  . senna-docusate (SENOKOT-S) 8.6-50 MG tablet Take 2 tablets at bedtime by mouth.     . tamoxifen (NOLVADEX) 20 MG tablet Take 1 tablet by mouth once daily 90 tablet 0  . traMADol (ULTRAM) 50 MG tablet Take 1 tablet (50 mg total) by mouth 3 (three) times daily. 90 tablet 5  . valACYclovir (VALTREX) 500 MG tablet Take 1 tablet (500 mg total) by mouth 2 (two) times daily. For 3 days when outbreaks. 24 tablet 1  . warfarin (COUMADIN) 1 MG tablet 1 tab wednesdays 20 tablet 2  . warfarin (COUMADIN) 3 MG tablet TAKE AS DIRECTED BY  ANTICOAGULATION  CLINIC 90 tablet 1   No current facility-administered medications for this visit.     PHYSICAL EXAMINATION:  Vitals:   08/10/18 0914  BP: 138/80  Pulse: 77  Resp: 18  Temp: 98.1 F (36.7 C)  SpO2: 98%   There were no vitals filed for this visit.  GENERAL:alert, no distress and comfortable EYES:  sclera clear LUNGS: respirations even and unlabored  HEART:  no lower extremity edema Musculoskeletal:no cyanosis of digits  NEURO: alert & oriented x 3 with fluent speech Breast: exam shows diffuse erythema and edema which improved from previous exam. See image in 08/08/18 progress note.         LABORATORY DATA:  I have reviewed the data as listed CBC Latest Ref Rng & Units 08/10/2018 01/19/2018 01/04/2018  WBC 4.0 - 10.5 K/uL 4.8 6.0 5.0  Hemoglobin 12.0 - 15.0 g/dL 12.1 12.8 13.8  Hematocrit 36.0 - 46.0 % 38.3 40.2 40.8  Platelets 150 - 400 K/uL 87(L) 98(L) 103.0(L)     CMP Latest Ref Rng & Units 08/10/2018 01/19/2018 01/04/2018  Glucose 70 - 99 mg/dL 106(H) 115(H) 89  BUN 8 - 23 mg/dL _0 Creatinine 0.44 - 1.00 mg/dL 1.26(H) 1.37(H) 1.19  Sodium 135 - 145 mmol/L 139 142 139  Potassium 3.5 - 5.1 mmol/L 4.5 5.0 4.8   Chloride 98 - 111 mmol/L 109 110 107  CO2 22 - 32 mmol/L _1 Calcium 8.9 - 10.3 mg/dL 9.3 9.9 10.0  Total Protein 6.5 - 8.1 g/dL 6.8 7.1 -  Total Bilirubin 0.3 - 1.2 mg/dL 0.3 0.5 -  Alkaline Phos 38 - 126 U/L 49 56 -  AST 15 - 41 U/L 13(L)  15 -  ALT 0 - 44 U/L 14 15 -      RADIOGRAPHIC STUDIES: I have personally reviewed the radiological images as listed and agreed with the findings in the report. No results found.   ASSESSMENT & PLAN: 73 year old female with h/o   1. Mastitis/cellulitis of left breast and chest wall -Ms. McEachean's cellulitis  is improved. I do not feel she needs more aggressive IV antibiotics.  -She will continue doxycycline. I reviewed dosing instructions and encouraged her to take 1 tab AM and PM. She only has 4 tabs left due to doubling the dose for 3 days. I refilled for 6 additional tabs, she will have enough to complete 1 week of therapy -labs reviewed. Cr. 1.26, I encouraged her to increase hydration. She is on lasix.  -INR 1.8; Dr. [name] manages coumadin. Currently on 3 mg daily except 1 mg on Wed. I left a detailed message with the coumadin nurse and sent an in-basket message to Dr. [name] to notify her as well. -will defer to Dr. [name] for coumadin management, she will likely need repeat INR next week.    2. Thrombocytopenia  -chronic, PLT count 98K - 128K from 2017 -PLT 87K today, denies bleeding. CBC otherwise unremarkable.  -may be worsened by current antibiotics  -She denies auto-immune infection such as RA -She will return in 1 month to f/u with Dr. Burr Medico and discuss CBC   3. 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 Oncotype 4 -she is clinically doing well, no evidence of recurrence in the left breast. Continue tamoxifen. -Mammogram and DEXA due 11/2018   PLAN: -Continue oral doxycycline 100 mg BID, refilled to complete 1 week course  -Defer coumadin  management to Dr. [name], left messages for her and RN -F/u with Dr. Burr Medico to ensure resolution of cellulitis and discuss CBC in 1 month  -Mammogram, DEXA in 11/2018  All questions were answered. The patient knows to call the clinic with any problems, questions or concerns. No barriers to learning was detected.     Alla Feeling, NP 08/10/18

## 2018-08-10 ENCOUNTER — Ambulatory Visit (INDEPENDENT_AMBULATORY_CARE_PROVIDER_SITE_OTHER): Payer: Medicare Other | Admitting: General Practice

## 2018-08-10 ENCOUNTER — Inpatient Hospital Stay: Payer: Medicare Other

## 2018-08-10 ENCOUNTER — Telehealth: Payer: Self-pay

## 2018-08-10 ENCOUNTER — Encounter: Payer: Self-pay | Admitting: Nurse Practitioner

## 2018-08-10 ENCOUNTER — Other Ambulatory Visit: Payer: Self-pay

## 2018-08-10 ENCOUNTER — Inpatient Hospital Stay: Payer: Medicare Other | Admitting: Nurse Practitioner

## 2018-08-10 VITALS — BP 138/80 | HR 77 | Temp 98.1°F | Resp 18 | Ht 67.0 in

## 2018-08-10 DIAGNOSIS — Z170 Estrogen receptor positive status [ER+]: Secondary | ICD-10-CM

## 2018-08-10 DIAGNOSIS — C50412 Malignant neoplasm of upper-outer quadrant of left female breast: Secondary | ICD-10-CM | POA: Diagnosis not present

## 2018-08-10 DIAGNOSIS — Z923 Personal history of irradiation: Secondary | ICD-10-CM

## 2018-08-10 DIAGNOSIS — I110 Hypertensive heart disease with heart failure: Secondary | ICD-10-CM

## 2018-08-10 DIAGNOSIS — Z79899 Other long term (current) drug therapy: Secondary | ICD-10-CM | POA: Diagnosis not present

## 2018-08-10 DIAGNOSIS — Z7982 Long term (current) use of aspirin: Secondary | ICD-10-CM | POA: Diagnosis not present

## 2018-08-10 DIAGNOSIS — E785 Hyperlipidemia, unspecified: Secondary | ICD-10-CM | POA: Diagnosis not present

## 2018-08-10 DIAGNOSIS — Z7901 Long term (current) use of anticoagulants: Secondary | ICD-10-CM

## 2018-08-10 DIAGNOSIS — I4891 Unspecified atrial fibrillation: Secondary | ICD-10-CM

## 2018-08-10 DIAGNOSIS — N61 Mastitis without abscess: Secondary | ICD-10-CM

## 2018-08-10 DIAGNOSIS — D696 Thrombocytopenia, unspecified: Secondary | ICD-10-CM

## 2018-08-10 DIAGNOSIS — F419 Anxiety disorder, unspecified: Secondary | ICD-10-CM

## 2018-08-10 DIAGNOSIS — M810 Age-related osteoporosis without current pathological fracture: Secondary | ICD-10-CM | POA: Diagnosis not present

## 2018-08-10 DIAGNOSIS — I350 Nonrheumatic aortic (valve) stenosis: Secondary | ICD-10-CM

## 2018-08-10 DIAGNOSIS — I5022 Chronic systolic (congestive) heart failure: Secondary | ICD-10-CM

## 2018-08-10 DIAGNOSIS — J449 Chronic obstructive pulmonary disease, unspecified: Secondary | ICD-10-CM | POA: Diagnosis not present

## 2018-08-10 DIAGNOSIS — I2510 Atherosclerotic heart disease of native coronary artery without angina pectoris: Secondary | ICD-10-CM

## 2018-08-10 DIAGNOSIS — Z79810 Long term (current) use of selective estrogen receptor modulators (SERMs): Secondary | ICD-10-CM

## 2018-08-10 LAB — CBC WITH DIFFERENTIAL (CANCER CENTER ONLY)
Abs Immature Granulocytes: 0.02 10*3/uL (ref 0.00–0.07)
Basophils Absolute: 0 10*3/uL (ref 0.0–0.1)
Basophils Relative: 1 %
Eosinophils Absolute: 0.3 10*3/uL (ref 0.0–0.5)
Eosinophils Relative: 7 %
HCT: 38.3 % (ref 36.0–46.0)
Hemoglobin: 12.1 g/dL (ref 12.0–15.0)
Immature Granulocytes: 0 %
Lymphocytes Relative: 20 %
Lymphs Abs: 1 10*3/uL (ref 0.7–4.0)
MCH: 30.8 pg (ref 26.0–34.0)
MCHC: 31.6 g/dL (ref 30.0–36.0)
MCV: 97.5 fL (ref 80.0–100.0)
Monocytes Absolute: 0.6 10*3/uL (ref 0.1–1.0)
Monocytes Relative: 12 %
Neutro Abs: 2.9 10*3/uL (ref 1.7–7.7)
Neutrophils Relative %: 60 %
Platelet Count: 87 10*3/uL — ABNORMAL LOW (ref 150–400)
RBC: 3.93 MIL/uL (ref 3.87–5.11)
RDW: 14.3 % (ref 11.5–15.5)
WBC Count: 4.8 10*3/uL (ref 4.0–10.5)
nRBC: 0 % (ref 0.0–0.2)

## 2018-08-10 LAB — CMP (CANCER CENTER ONLY)
ALT: 14 U/L (ref 0–44)
AST: 13 U/L — ABNORMAL LOW (ref 15–41)
Albumin: 3.4 g/dL — ABNORMAL LOW (ref 3.5–5.0)
Alkaline Phosphatase: 49 U/L (ref 38–126)
Anion gap: 8 (ref 5–15)
BUN: 15 mg/dL (ref 8–23)
CO2: 22 mmol/L (ref 22–32)
Calcium: 9.3 mg/dL (ref 8.9–10.3)
Chloride: 109 mmol/L (ref 98–111)
Creatinine: 1.26 mg/dL — ABNORMAL HIGH (ref 0.44–1.00)
GFR, Est AFR Am: 49 mL/min — ABNORMAL LOW (ref >60)
GFR, Estimated: 43 mL/min — ABNORMAL LOW (ref >60)
Glucose, Bld: 106 mg/dL — ABNORMAL HIGH (ref 70–99)
Potassium: 4.5 mmol/L (ref 3.5–5.1)
Sodium: 139 mmol/L (ref 135–145)
Total Bilirubin: 0.3 mg/dL (ref 0.3–1.2)
Total Protein: 6.8 g/dL (ref 6.5–8.1)

## 2018-08-10 LAB — PROTIME-INR
INR: 1.8 — ABNORMAL HIGH (ref 0.8–1.2)
Prothrombin Time: 20.9 seconds — ABNORMAL HIGH (ref 11.4–15.2)

## 2018-08-10 MED ORDER — DOXYCYCLINE HYCLATE 100 MG PO TABS: 100.0000 mg | ORAL_TABLET | Freq: Two times a day (BID) | ORAL | 0 refills | Status: DC

## 2018-08-10 NOTE — Patient Instructions (Addendum)
Pre visit review using our clinic review tool, if applicable. No additional management support is needed unless otherwise documented below in the visit note.  Please take 1 1/2 tablets of coumadin today and then continue 3 mg daily except 1.5 mg on Wed.  Due to taking doxycycline for cellulitis please re-check on Monday.  Dosing instructions left on VM.  Asked patient to call back to verify that she received instructions.

## 2018-08-10 NOTE — Addendum Note (Signed)
Addended by: Alla Feeling on: 08/10/2018 03:26 PM   Modules accepted: Orders

## 2018-08-10 NOTE — Telephone Encounter (Signed)
Thank you both. [name]

## 2018-08-10 NOTE — Telephone Encounter (Signed)
NP Cira Rue at the cancer center left message on my voicemail regarding patient's INR that was drawn today.  INR today is 1.8  Patient is being seen for left breast cellulitis and is on doxycycline and also is being treated with tamoxifen.  She wanted to be sure coumadin clinic was aware and to contact patient regarding her INR management needs.  I will forward this to Villa Herb, RN who is her primary coumadin clinic nurse for oversight and management.  I did lm on nurse's vm for NP making her aware that I received message and will be forwarding it to correct location.   Thanks.

## 2018-08-10 NOTE — Telephone Encounter (Signed)
Noted! Thank you

## 2018-08-11 ENCOUNTER — Telehealth: Payer: Self-pay | Admitting: Nurse Practitioner

## 2018-08-11 NOTE — Telephone Encounter (Signed)
Scheduled appt per 6/11 los. Was not able to reach the patient. A calendar will be mailed out.

## 2018-08-14 ENCOUNTER — Other Ambulatory Visit: Payer: Self-pay

## 2018-08-14 ENCOUNTER — Ambulatory Visit (INDEPENDENT_AMBULATORY_CARE_PROVIDER_SITE_OTHER): Payer: Medicare Other | Admitting: General Practice

## 2018-08-14 DIAGNOSIS — Z7901 Long term (current) use of anticoagulants: Secondary | ICD-10-CM | POA: Diagnosis not present

## 2018-08-14 DIAGNOSIS — Z5181 Encounter for therapeutic drug level monitoring: Secondary | ICD-10-CM

## 2018-08-14 LAB — POCT INR: INR: 2.8 (ref 2.0–3.0)

## 2018-08-14 NOTE — Patient Instructions (Incomplete)
Pre visit review using our clinic review tool, if applicable. No additional management support is needed unless otherwise documented below in the visit note.  Continue 3 mg daily except 1.5 mg on Wed.  Re-check in 4 weeks.

## 2018-08-15 ENCOUNTER — Other Ambulatory Visit: Payer: Self-pay | Admitting: Internal Medicine

## 2018-08-20 NOTE — Progress Notes (Signed)
INR reviewed. Dose adjusted by Villa Herb, RN. Will await follow up INR results. Webb Silversmith, NP

## 2018-08-21 ENCOUNTER — Telehealth: Payer: Self-pay | Admitting: Nurse Practitioner

## 2018-08-21 NOTE — Telephone Encounter (Signed)
I tried to reach patient to f/u left breast cellulitis. No answer. I did not leave message. Will try to reach her at a later date.  Cira Rue, NP  08/21/18

## 2018-08-24 ENCOUNTER — Telehealth: Payer: Self-pay | Admitting: Nurse Practitioner

## 2018-08-24 NOTE — Telephone Encounter (Signed)
I called [name] to f/u left breast cellulitis. Warmth resolved. She thinks the redness is almost gone. Breast was hyperpigmented r/t radiation, feels it is nearly back to baseline. She would like to have it examined again to be sure. Gave her f/u appt with me 6/30 at 9:45. She is aware. Schedule message sent. She appreciates the call. Danielle Rue, NP  08/24/18

## 2018-08-28 ENCOUNTER — Encounter: Payer: Self-pay | Admitting: Nurse Practitioner

## 2018-08-28 ENCOUNTER — Inpatient Hospital Stay: Payer: Medicare Other | Admitting: Nurse Practitioner

## 2018-08-28 ENCOUNTER — Ambulatory Visit: Payer: Medicare Other

## 2018-08-28 ENCOUNTER — Inpatient Hospital Stay (HOSPITAL_COMMUNITY): Payer: Medicare Other

## 2018-08-28 ENCOUNTER — Other Ambulatory Visit: Payer: Self-pay

## 2018-08-28 ENCOUNTER — Inpatient Hospital Stay (HOSPITAL_COMMUNITY)
Admission: AD | Admit: 2018-08-28 | Discharge: 2018-09-01 | DRG: 600 | Disposition: A | Discharge status: Home / Self Care | Payer: Medicare Other | Source: Ambulatory Visit | Attending: Internal Medicine | Admitting: Internal Medicine

## 2018-08-28 ENCOUNTER — Encounter (HOSPITAL_COMMUNITY): Payer: Self-pay

## 2018-08-28 VITALS — BP 139/67 | HR 91 | Temp 99.6°F | Resp 20 | Ht 67.0 in | Wt 252.7 lb

## 2018-08-28 DIAGNOSIS — I10 Essential (primary) hypertension: Secondary | ICD-10-CM | POA: Diagnosis not present

## 2018-08-28 DIAGNOSIS — L0291 Cutaneous abscess, unspecified: Secondary | ICD-10-CM

## 2018-08-28 DIAGNOSIS — N61 Mastitis without abscess: Secondary | ICD-10-CM

## 2018-08-28 DIAGNOSIS — I2510 Atherosclerotic heart disease of native coronary artery without angina pectoris: Secondary | ICD-10-CM | POA: Diagnosis not present

## 2018-08-28 DIAGNOSIS — Z7951 Long term (current) use of inhaled steroids: Secondary | ICD-10-CM

## 2018-08-28 DIAGNOSIS — N183 Chronic kidney disease, stage 3 unspecified: Secondary | ICD-10-CM | POA: Diagnosis present

## 2018-08-28 DIAGNOSIS — Z9889 Other specified postprocedural states: Secondary | ICD-10-CM | POA: Diagnosis not present

## 2018-08-28 DIAGNOSIS — I428 Other cardiomyopathies: Secondary | ICD-10-CM

## 2018-08-28 DIAGNOSIS — Z887 Allergy status to serum and vaccine status: Secondary | ICD-10-CM | POA: Diagnosis not present

## 2018-08-28 DIAGNOSIS — B373 Candidiasis of vulva and vagina: Secondary | ICD-10-CM | POA: Diagnosis not present

## 2018-08-28 DIAGNOSIS — Z853 Personal history of malignant neoplasm of breast: Secondary | ICD-10-CM | POA: Diagnosis not present

## 2018-08-28 DIAGNOSIS — K8020 Calculus of gallbladder without cholecystitis without obstruction: Secondary | ICD-10-CM | POA: Diagnosis present

## 2018-08-28 DIAGNOSIS — M810 Age-related osteoporosis without current pathological fracture: Secondary | ICD-10-CM

## 2018-08-28 DIAGNOSIS — C50412 Malignant neoplasm of upper-outer quadrant of left female breast: Secondary | ICD-10-CM

## 2018-08-28 DIAGNOSIS — J449 Chronic obstructive pulmonary disease, unspecified: Secondary | ICD-10-CM | POA: Diagnosis present

## 2018-08-28 DIAGNOSIS — Z8742 Personal history of other diseases of the female genital tract: Secondary | ICD-10-CM

## 2018-08-28 DIAGNOSIS — Z66 Do not resuscitate: Secondary | ICD-10-CM | POA: Diagnosis not present

## 2018-08-28 DIAGNOSIS — Z888 Allergy status to other drugs, medicaments and biological substances status: Secondary | ICD-10-CM | POA: Diagnosis not present

## 2018-08-28 DIAGNOSIS — Z8679 Personal history of other diseases of the circulatory system: Secondary | ICD-10-CM

## 2018-08-28 DIAGNOSIS — D693 Immune thrombocytopenic purpura: Secondary | ICD-10-CM | POA: Diagnosis present

## 2018-08-28 DIAGNOSIS — Z923 Personal history of irradiation: Secondary | ICD-10-CM

## 2018-08-28 DIAGNOSIS — I4891 Unspecified atrial fibrillation: Secondary | ICD-10-CM

## 2018-08-28 DIAGNOSIS — Z87891 Personal history of nicotine dependence: Secondary | ICD-10-CM

## 2018-08-28 DIAGNOSIS — Z7982 Long term (current) use of aspirin: Secondary | ICD-10-CM

## 2018-08-28 DIAGNOSIS — I480 Paroxysmal atrial fibrillation: Secondary | ICD-10-CM | POA: Diagnosis present

## 2018-08-28 DIAGNOSIS — E785 Hyperlipidemia, unspecified: Secondary | ICD-10-CM | POA: Diagnosis not present

## 2018-08-28 DIAGNOSIS — Z6839 Body mass index (BMI) 39.0-39.9, adult: Secondary | ICD-10-CM

## 2018-08-28 DIAGNOSIS — F419 Anxiety disorder, unspecified: Secondary | ICD-10-CM

## 2018-08-28 DIAGNOSIS — Z79899 Other long term (current) drug therapy: Secondary | ICD-10-CM

## 2018-08-28 DIAGNOSIS — Z20828 Contact with and (suspected) exposure to other viral communicable diseases: Secondary | ICD-10-CM | POA: Diagnosis present

## 2018-08-28 DIAGNOSIS — Z170 Estrogen receptor positive status [ER+]: Secondary | ICD-10-CM

## 2018-08-28 DIAGNOSIS — I130 Hypertensive heart and chronic kidney disease with heart failure and stage 1 through stage 4 chronic kidney disease, or unspecified chronic kidney disease: Secondary | ICD-10-CM | POA: Diagnosis present

## 2018-08-28 DIAGNOSIS — L03311 Cellulitis of abdominal wall: Secondary | ICD-10-CM | POA: Diagnosis present

## 2018-08-28 DIAGNOSIS — J45909 Unspecified asthma, uncomplicated: Secondary | ICD-10-CM | POA: Diagnosis present

## 2018-08-28 DIAGNOSIS — G8929 Other chronic pain: Secondary | ICD-10-CM | POA: Diagnosis present

## 2018-08-28 DIAGNOSIS — Z9221 Personal history of antineoplastic chemotherapy: Secondary | ICD-10-CM | POA: Diagnosis not present

## 2018-08-28 DIAGNOSIS — I509 Heart failure, unspecified: Secondary | ICD-10-CM | POA: Diagnosis not present

## 2018-08-28 DIAGNOSIS — Z79810 Long term (current) use of selective estrogen receptor modulators (SERMs): Secondary | ICD-10-CM

## 2018-08-28 DIAGNOSIS — I700 Atherosclerosis of aorta: Secondary | ICD-10-CM | POA: Diagnosis present

## 2018-08-28 DIAGNOSIS — D696 Thrombocytopenia, unspecified: Secondary | ICD-10-CM | POA: Diagnosis not present

## 2018-08-28 DIAGNOSIS — I350 Nonrheumatic aortic (valve) stenosis: Secondary | ICD-10-CM

## 2018-08-28 DIAGNOSIS — N6324 Unspecified lump in the left breast, lower inner quadrant: Secondary | ICD-10-CM | POA: Diagnosis not present

## 2018-08-28 DIAGNOSIS — N1832 Chronic kidney disease, stage 3b: Secondary | ICD-10-CM | POA: Diagnosis present

## 2018-08-28 DIAGNOSIS — Z7901 Long term (current) use of anticoagulants: Secondary | ICD-10-CM

## 2018-08-28 DIAGNOSIS — B9562 Methicillin resistant Staphylococcus aureus infection as the cause of diseases classified elsewhere: Secondary | ICD-10-CM | POA: Diagnosis not present

## 2018-08-28 DIAGNOSIS — N6323 Unspecified lump in the left breast, lower outer quadrant: Secondary | ICD-10-CM | POA: Diagnosis not present

## 2018-08-28 DIAGNOSIS — Z79891 Long term (current) use of opiate analgesic: Secondary | ICD-10-CM

## 2018-08-28 DIAGNOSIS — E66812 Obesity, class 2: Secondary | ICD-10-CM | POA: Diagnosis present

## 2018-08-28 DIAGNOSIS — D649 Anemia, unspecified: Secondary | ICD-10-CM | POA: Diagnosis not present

## 2018-08-28 DIAGNOSIS — Z952 Presence of prosthetic heart valve: Secondary | ICD-10-CM | POA: Diagnosis not present

## 2018-08-28 DIAGNOSIS — N6489 Other specified disorders of breast: Secondary | ICD-10-CM | POA: Diagnosis not present

## 2018-08-28 DIAGNOSIS — I5032 Chronic diastolic (congestive) heart failure: Secondary | ICD-10-CM

## 2018-08-28 DIAGNOSIS — C50912 Malignant neoplasm of unspecified site of left female breast: Secondary | ICD-10-CM | POA: Diagnosis not present

## 2018-08-28 DIAGNOSIS — I5022 Chronic systolic (congestive) heart failure: Secondary | ICD-10-CM | POA: Diagnosis present

## 2018-08-28 DIAGNOSIS — Z872 Personal history of diseases of the skin and subcutaneous tissue: Secondary | ICD-10-CM | POA: Diagnosis not present

## 2018-08-28 DIAGNOSIS — I110 Hypertensive heart disease with heart failure: Secondary | ICD-10-CM

## 2018-08-28 DIAGNOSIS — R7881 Bacteremia: Secondary | ICD-10-CM | POA: Diagnosis present

## 2018-08-28 DIAGNOSIS — E0590 Thyrotoxicosis, unspecified without thyrotoxic crisis or storm: Secondary | ICD-10-CM | POA: Diagnosis not present

## 2018-08-28 DIAGNOSIS — Z791 Long term (current) use of non-steroidal anti-inflammatories (NSAID): Secondary | ICD-10-CM

## 2018-08-28 DIAGNOSIS — Z7983 Long term (current) use of bisphosphonates: Secondary | ICD-10-CM

## 2018-08-28 DIAGNOSIS — L03313 Cellulitis of chest wall: Secondary | ICD-10-CM | POA: Diagnosis not present

## 2018-08-28 LAB — BASIC METABOLIC PANEL
Anion gap: 8 (ref 5–15)
BUN: 13 mg/dL (ref 8–23)
CO2: 21 mmol/L — ABNORMAL LOW (ref 22–32)
Calcium: 9.3 mg/dL (ref 8.9–10.3)
Chloride: 111 mmol/L (ref 98–111)
Creatinine, Ser: 1.04 mg/dL — ABNORMAL HIGH (ref 0.44–1.00)
GFR calc Af Amer: >60 mL/min (ref >60)
GFR calc non Af Amer: 54 mL/min — ABNORMAL LOW (ref >60)
Glucose, Bld: 90 mg/dL (ref 70–99)
Potassium: 3.9 mmol/L (ref 3.5–5.1)
Sodium: 140 mmol/L (ref 135–145)

## 2018-08-28 LAB — CBC WITH DIFFERENTIAL/PLATELET
Abs Immature Granulocytes: 0.03 10*3/uL (ref 0.00–0.07)
Basophils Absolute: 0 10*3/uL (ref 0.0–0.1)
Basophils Relative: 0 %
Eosinophils Absolute: 0.1 10*3/uL (ref 0.0–0.5)
Eosinophils Relative: 1 %
HCT: 40.3 % (ref 36.0–46.0)
Hemoglobin: 12.2 g/dL (ref 12.0–15.0)
Immature Granulocytes: 0 %
Lymphocytes Relative: 11 %
Lymphs Abs: 1.2 10*3/uL (ref 0.7–4.0)
MCH: 30.2 pg (ref 26.0–34.0)
MCHC: 30.3 g/dL (ref 30.0–36.0)
MCV: 99.8 fL (ref 80.0–100.0)
Monocytes Absolute: 0.6 10*3/uL (ref 0.1–1.0)
Monocytes Relative: 5 %
Neutro Abs: 9 10*3/uL — ABNORMAL HIGH (ref 1.7–7.7)
Neutrophils Relative %: 83 %
Platelets: 82 10*3/uL — ABNORMAL LOW (ref 150–400)
RBC: 4.04 MIL/uL (ref 3.87–5.11)
RDW: 14.6 % (ref 11.5–15.5)
WBC: 10.9 10*3/uL — ABNORMAL HIGH (ref 4.0–10.5)
nRBC: 0 % (ref 0.0–0.2)

## 2018-08-28 LAB — MAGNESIUM: Magnesium: 2.1 mg/dL (ref 1.7–2.4)

## 2018-08-28 LAB — PROTIME-INR
INR: 1.9 — ABNORMAL HIGH (ref 0.8–1.2)
Prothrombin Time: 21.3 seconds — ABNORMAL HIGH (ref 11.4–15.2)

## 2018-08-28 LAB — C-REACTIVE PROTEIN: CRP: 2 mg/dL — ABNORMAL HIGH (ref <1.0)

## 2018-08-28 LAB — SEDIMENTATION RATE: Sed Rate: 13 mm/hr (ref 0–22)

## 2018-08-28 LAB — PREALBUMIN: Prealbumin: 21.2 mg/dL (ref 18–38)

## 2018-08-28 LAB — HEMOGLOBIN A1C
Hgb A1c MFr Bld: 5 % (ref 4.8–5.6)
Mean Plasma Glucose: 96.8 mg/dL

## 2018-08-28 MED ORDER — ASPIRIN EC 81 MG PO TBEC
81.0000 mg | DELAYED_RELEASE_TABLET | Freq: Every day | ORAL | Status: DC
  Administered 2018-08-28 – 2018-09-01 (×5): 81 mg via ORAL
  Filled 2018-08-28 (×5): qty 1

## 2018-08-28 MED ORDER — ACETAMINOPHEN 325 MG PO TABS
650.0000 mg | ORAL_TABLET | Freq: Four times a day (QID) | ORAL | Status: DC | PRN
  Administered 2018-08-28: 650 mg via ORAL
  Filled 2018-08-28: qty 2

## 2018-08-28 MED ORDER — VALACYCLOVIR HCL 500 MG PO TABS
500.0000 mg | ORAL_TABLET | Freq: Two times a day (BID) | ORAL | Status: DC
  Administered 2018-08-28 – 2018-09-01 (×8): 500 mg via ORAL
  Filled 2018-08-28 (×8): qty 1

## 2018-08-28 MED ORDER — HYDROCODONE-ACETAMINOPHEN 5-325 MG PO TABS: 1.0000 | ORAL_TABLET | ORAL | Status: DC | PRN

## 2018-08-28 MED ORDER — DIPHENHYDRAMINE HCL 25 MG PO CAPS
25.0000 mg | ORAL_CAPSULE | Freq: Every evening | ORAL | Status: DC | PRN
  Administered 2018-08-28 – 2018-08-31 (×4): 25 mg via ORAL
  Filled 2018-08-28 (×4): qty 1

## 2018-08-28 MED ORDER — DIPHENHYDRAMINE-APAP (SLEEP) 25-500 MG PO TABS: 2.0000 | ORAL_TABLET | Freq: Every evening | ORAL | Status: DC | PRN

## 2018-08-28 MED ORDER — TAMOXIFEN CITRATE 10 MG PO TABS
20.0000 mg | ORAL_TABLET | Freq: Every day | ORAL | Status: DC
  Administered 2018-08-29 – 2018-09-01 (×4): 20 mg via ORAL
  Filled 2018-08-28 (×4): qty 2

## 2018-08-28 MED ORDER — MOMETASONE FURO-FORMOTEROL FUM 200-5 MCG/ACT IN AERO
2.0000 | INHALATION_SPRAY | Freq: Two times a day (BID) | RESPIRATORY_TRACT | Status: DC
  Administered 2018-08-29 – 2018-09-01 (×6): 2 via RESPIRATORY_TRACT
  Filled 2018-08-28: qty 8.8

## 2018-08-28 MED ORDER — TRAMADOL HCL 50 MG PO TABS
50.0000 mg | ORAL_TABLET | Freq: Three times a day (TID) | ORAL | Status: DC
  Administered 2018-08-28 – 2018-09-01 (×12): 50 mg via ORAL
  Filled 2018-08-28 (×13): qty 1

## 2018-08-28 MED ORDER — CARVEDILOL 25 MG PO TABS
25.0000 mg | ORAL_TABLET | Freq: Two times a day (BID) | ORAL | Status: DC
  Administered 2018-08-28 – 2018-09-01 (×8): 25 mg via ORAL
  Filled 2018-08-28 (×8): qty 1

## 2018-08-28 MED ORDER — METHIMAZOLE 5 MG PO TABS
15.0000 mg | ORAL_TABLET | Freq: Every evening | ORAL | Status: DC
  Administered 2018-08-28 – 2018-08-29 (×2): 15 mg via ORAL
  Administered 2018-08-30: 5 mg via ORAL
  Administered 2018-08-30 – 2018-09-01 (×3): 15 mg via ORAL
  Filled 2018-08-28 (×6): qty 1

## 2018-08-28 MED ORDER — SODIUM CHLORIDE 0.9 % IV SOLN: 3.0000 g | Freq: Four times a day (QID) | INTRAVENOUS | Status: DC

## 2018-08-28 MED ORDER — WARFARIN - PHARMACIST DOSING INPATIENT: Freq: Every day | Status: DC

## 2018-08-28 MED ORDER — WARFARIN SODIUM 3 MG PO TABS
3.0000 mg | ORAL_TABLET | Freq: Once | ORAL | Status: AC
  Administered 2018-08-28: 3 mg via ORAL
  Filled 2018-08-28: qty 1

## 2018-08-28 MED ORDER — ONDANSETRON HCL 4 MG/2ML IJ SOLN: 4.0000 mg | Freq: Four times a day (QID) | INTRAMUSCULAR | Status: DC | PRN

## 2018-08-28 MED ORDER — ROSUVASTATIN CALCIUM 20 MG PO TABS
40.0000 mg | ORAL_TABLET | Freq: Every day | ORAL | Status: DC
  Administered 2018-08-28 – 2018-08-31 (×4): 40 mg via ORAL
  Filled 2018-08-28 (×5): qty 2

## 2018-08-28 MED ORDER — ALBUTEROL SULFATE (2.5 MG/3ML) 0.083% IN NEBU: 2.5000 mg | INHALATION_SOLUTION | Freq: Four times a day (QID) | RESPIRATORY_TRACT | Status: DC | PRN

## 2018-08-28 MED ORDER — LOSARTAN POTASSIUM 50 MG PO TABS
50.0000 mg | ORAL_TABLET | Freq: Every day | ORAL | Status: DC
  Administered 2018-08-29 – 2018-09-01 (×4): 50 mg via ORAL
  Filled 2018-08-28 (×4): qty 1

## 2018-08-28 MED ORDER — GABAPENTIN 300 MG PO CAPS
600.0000 mg | ORAL_CAPSULE | Freq: Every day | ORAL | Status: DC
  Administered 2018-08-28 – 2018-08-31 (×4): 600 mg via ORAL
  Filled 2018-08-28 (×4): qty 2

## 2018-08-28 MED ORDER — SODIUM CHLORIDE 0.9 % IV SOLN
3.0000 g | Freq: Four times a day (QID) | INTRAVENOUS | Status: DC
  Administered 2018-08-28 – 2018-08-29 (×4): 3 g via INTRAVENOUS
  Filled 2018-08-28 (×5): qty 3

## 2018-08-28 MED ORDER — SODIUM CHLORIDE 0.9 % IV SOLN
INTRAVENOUS | Status: DC
  Administered 2018-08-28 – 2018-08-29 (×2): via INTRAVENOUS

## 2018-08-28 MED ORDER — ACETAMINOPHEN 650 MG RE SUPP: 650.0000 mg | Freq: Four times a day (QID) | RECTAL | Status: DC | PRN

## 2018-08-28 MED ORDER — SENNOSIDES-DOCUSATE SODIUM 8.6-50 MG PO TABS
2.0000 | ORAL_TABLET | Freq: Every day | ORAL | Status: DC
  Administered 2018-08-28 – 2018-08-31 (×4): 2 via ORAL
  Filled 2018-08-28 (×4): qty 2

## 2018-08-28 MED ORDER — FUROSEMIDE 20 MG PO TABS
20.0000 mg | ORAL_TABLET | Freq: Every day | ORAL | Status: DC
  Administered 2018-08-28 – 2018-09-01 (×5): 20 mg via ORAL
  Filled 2018-08-28 (×5): qty 1

## 2018-08-28 MED ORDER — ONDANSETRON HCL 4 MG PO TABS: 4.0000 mg | ORAL_TABLET | Freq: Four times a day (QID) | ORAL | Status: DC | PRN

## 2018-08-28 NOTE — H&P (Signed)
History and Physical    Danielle [name]:[id number] DOB: 1945-07-31 DOA: 08/28/2018  PCP: [name], [name], MD  Patient coming from: Home/oncology clinic  I have personally briefly reviewed patient's old medical records in [place]  Chief Complaint: Redness and warmth of left abdominal pain and left breast  HPI: Danielle [name] is a 73 y.o. female with medical history significant of aortic stenosis status post bioprosthetic aVR, asthma, breast cancer, COPD, CAD, hypertension and hypothyroidism is being directly admitted for left abdominal wall as well as left breast cellulitis.  I received a phone call from patient's primary oncologist Dr. Burr Medico that patient presented to their clinic 2 weeks ago and was diagnosed with left abdominal wall as well as left breast cellulitis and she was treated with a 10-day course of oral doxycycline which had resolved the symptoms but soon after she completed the course, her redness reappeared again and she went to see her oncologist again today.  This looked very worse than prior so direct admission was requested.  Patient has no other complaint other than pain and redness.  She denies any chest pain, cough, shortness of breath, fever, chills or sweating.  ED Course: Directly admitted through oncologist office.  Review of Systems: As per HPI otherwise 10 point review of systems negative.    Past Medical History:  Diagnosis Date  . Allergy   . Anxiety   . Aortic stenosis    Status post bioprosthetic AVR  . Arthritis    DJD  . Asthma   . Breast cancer ([place]) 12/13/2016   Left breast  . Chronic systolic CHF (congestive heart failure) ([place])    EF 30% 04/2014  . COPD (chronic obstructive pulmonary disease) ([place])   . Coronary artery disease    per pt, had LHC prior to AVR in [place] that did not show any blockages; no stents/bypass  . Gastritis   . Hyperlipidemia   . Hypertension   . Hyperthyroidism   . Multiple  thyroid nodules   . Osteoporosis   . Paroxysmal atrial fibrillation (H[facility])   . Pelvis fracture ([place]) 08/19/2015   MULTIPLE   . Personal history of radiation therapy 2018  . Spontaneous pneumothorax 11/28/2015   left   . Stroke [facility]) 2000   rt hand weak    Past Surgical History:  Procedure Laterality Date  . BREAST LUMPECTOMY Left 12/2016  . BREAST LUMPECTOMY WITH RADIOACTIVE SEED AND SENTINEL LYMPH NODE BIOPSY Left 01/14/2017   Procedure: LEFT BREAST LUMPECTOMY WITH RADIOACTIVE SEED AND SENTINEL LYMPH NODE BIOPSY;  Surgeon: Rolm Bookbinder, MD;  Location: [place];  Service: General;  Laterality: Left;  . CHEST TUBE INSERTION  10/2015  . INTRAMEDULLARY (IM) NAIL INTERTROCHANTERIC Left 12/10/2015   Procedure: INTRAMEDULLARY (IM) NAIL INTERTROCHANTRIC;  Surgeon: Leandrew Koyanagi, MD;  Location: [place];  Service: Orthopedics;  Laterality: Left;  . PLEURADESIS Left 12/03/2015   Procedure: PLEURADESIS;  Surgeon: Melrose Nakayama, MD;  Location: [place];  Service: Thoracic;  Laterality: Left;  . RESECTION OF APICAL BLEB Left 12/03/2015   Procedure: BLEBECTOMY;  Surgeon: Melrose Nakayama, MD;  Location: [place];  Service: Thoracic;  Laterality: Left;  . THORACOSCOPY  12/03/2015  . VALVE REPLACEMENT    . VIDEO ASSISTED THORACOSCOPY Left 12/03/2015   Procedure: VIDEO ASSISTED THORACOSCOPY;  Surgeon: Melrose Nakayama, MD;  Location: [place];  Service: Thoracic;  Laterality: Left;     reports that she quit smoking about 8 years ago. Her smoking use included  cigarettes. She has a 2.50 pack-year smoking history. She has never used smokeless tobacco. She reports that she does not drink alcohol or use drugs.  Allergies  Allergen Reactions  . Lisinopril Cough  . Tetanus Toxoid Adsorbed Swelling    Arm swelling    Family History  Problem Relation Age of Onset  . Diabetes Mother   . Heart attack Mother 43  . Diabetes Father   . Lung cancer Father   . Diabetes Sister   . Thyroid disease  Sister   . Diabetes Sister   . HIV Brother     Prior to Admission medications   Medication Sig Start Date End Date Taking? Authorizing Provider  acetaminophen (TYLENOL) 500 MG tablet Take 1,000 mg by mouth daily as needed (PAIN).    [provider]  albuterol (PROVENTIL HFA;VENTOLIN HFA) 108 (90 Base) MCG/ACT inhaler Inhale into the lungs. 12/11/14   [provider]  alendronate (FOSAMAX) 70 MG tablet TAKE  1 TABLET BY MOUTH EVERY SATURDAY WITH A FULL GLASS OF WATER ON A EMPTY STOMACH 07/07/18   [name], [name], MD  aspirin EC 81 MG tablet Take 81 mg daily by mouth.    [provider]  budesonide-formoterol (SYMBICORT) 160-4.5 MCG/ACT inhaler Inhale 2 puffs into the lungs 2 (two) times daily. 03/11/16   Tanda Rockers, MD  carvedilol (COREG) 25 MG tablet Take 1 tablet by mouth twice daily 08/15/18   End, Harrell Gave, MD  Cholecalciferol (VITAMIN D3) 400 units tablet Take 1 tablet (400 Units total) by mouth daily. 12/16/15   Ledell Noss, MD  diclofenac sodium (VOLTAREN) 1 % GEL Apply 4 g topically 4 (four) times daily. 05/18/18   Kirsteins, Luanna Salk, MD  diphenhydramine-acetaminophen (TYLENOL PM) 25-500 MG TABS tablet Take 2 tablets by mouth at bedtime as needed (SLEEP).     [provider]  doxycycline (VIBRA-TABS) 100 MG tablet Take 1 tablet (100 mg total) by mouth 2 (two) times daily. 08/10/18   Alla Feeling, NP  furosemide (LASIX) 20 MG tablet Take 1 tablet (20 mg total) by mouth daily. 08/04/18   [name], [name], MD  gabapentin (NEURONTIN) 300 MG capsule Take 2 capsules (600 mg total) by mouth at bedtime. 05/18/18   Kirsteins, Luanna Salk, MD  losartan (COZAAR) 50 MG tablet Take 1 tablet by mouth once daily 08/15/18   End, Harrell Gave, MD  methimazole (TAPAZOLE) 5 MG tablet Take 3 tablets (15 mg total) by mouth every evening. 08/04/18   [name], [name], MD  rosuvastatin (CRESTOR) 40 MG tablet Take 1 tablet (40 mg total) by mouth daily. 08/26/17   [name], [name], MD   senna-docusate (SENOKOT-S) 8.6-50 MG tablet Take 2 tablets at bedtime by mouth.     [provider]  tamoxifen (NOLVADEX) 20 MG tablet Take 1 tablet by mouth once daily 07/12/18   [name], MD  traMADol (ULTRAM) 50 MG tablet Take 1 tablet (50 mg total) by mouth 3 (three) times daily. 05/18/18   Kirsteins, Luanna Salk, MD  valACYclovir (VALTREX) 500 MG tablet Take 1 tablet (500 mg total) by mouth 2 (two) times daily. For 3 days when outbreaks. 01/17/18   [name], [name], MD  warfarin (COUMADIN) 1 MG tablet 1 tab wednesdays 08/04/18   [name], [name], MD  warfarin (COUMADIN) 3 MG tablet TAKE AS DIRECTED BY  ANTICOAGULATION  CLINIC 08/04/18   [name], [name], MD    Physical Exam: Vitals:   08/28/18 1554  BP: (!) 145/79  Pulse: 71  Resp: (!) 28  Temp: 99.6 F (37.6 C)  TempSrc: Oral  SpO2: 100%  Weight: 114.1 kg  Height: 5\' 7"  (1.702 m)    Constitutional: NAD, calm, comfortable Vitals:   08/28/18 1554  BP: (!) 145/79  Pulse: 71  Resp: (!) 28  Temp: 99.6 F (37.6 C)  TempSrc: Oral  SpO2: 100%  Weight: 114.1 kg  Height: 5\' 7"  (1.702 m)   Eyes: PERRL, lids and conjunctivae normal ENMT: Mucous membranes are moist. Posterior pharynx clear of any exudate or lesions.Normal dentition.  Neck: normal, supple, no masses, no thyromegaly Respiratory: clear to auscultation bilaterally, no wheezing, no crackles. Normal respiratory effort. No accessory muscle use.  Cardiovascular: Regular rate and rhythm, no murmurs / rubs / gallops. No extremity edema. 2+ pedal pulses. No carotid bruits.  Abdomen: no tenderness, no masses palpated. No hepatosplenomegaly. Bowel sounds positive.  Musculoskeletal: no clubbing / cyanosis. No joint deformity upper and lower extremities. Good ROM, no contractures. Normal muscle tone.  Skin: Erythema involving 90% of the left breast and significantly large area of the left abdominal wall extending up to the left back wall which is significantly warm compared to the  normal area and tender to touch.  She also has confluent area in the left lower quadrant. Neurologic: CN 2-12 grossly intact. Sensation intact, DTR normal. Strength 5/5 in all 4.  Psychiatric: Normal judgment and insight. Alert and oriented x 3. Normal mood.    Labs on Admission: I have personally reviewed following labs and imaging studies  CBC: No results for input(s): WBC, NEUTROABS, HGB, HCT, MCV, PLT in the last 168 hours. Basic Metabolic Panel: No results for input(s): NA, K, CL, CO2, GLUCOSE, BUN, CREATININE, CALCIUM, MG, PHOS in the last 168 hours. GFR: Estimated Creatinine Clearance: 52.6 mL/min (A) (by C-G formula based on SCr of 1.26 mg/dL (H)). Liver Function Tests: No results for input(s): AST, ALT, ALKPHOS, BILITOT, PROT, ALBUMIN in the last 168 hours. No results for input(s): LIPASE, AMYLASE in the last 168 hours. No results for input(s): AMMONIA in the last 168 hours. Coagulation Profile: No results for input(s): INR, PROTIME in the last 168 hours. Cardiac Enzymes: No results for input(s): CKTOTAL, CKMB, CKMBINDEX, TROPONINI in the last 168 hours. BNP (last 3 results) No results for input(s): PROBNP in the last 8760 hours. HbA1C: No results for input(s): HGBA1C in the last 72 hours. CBG: No results for input(s): GLUCAP in the last 168 hours. Lipid Profile: No results for input(s): CHOL, HDL, LDLCALC, TRIG, CHOLHDL, LDLDIRECT in the last 72 hours. Thyroid Function Tests: No results for input(s): TSH, T4TOTAL, FREET4, T3FREE, THYROIDAB in the last 72 hours. Anemia Panel: No results for input(s): VITAMINB12, FOLATE, FERRITIN, TIBC, IRON, RETICCTPCT in the last 72 hours. Urine analysis:    Component Value Date/Time   COLORURINE AMBER (A) 12/12/2015 0037   APPEARANCEUR CLEAR 12/12/2015 0037   LABSPEC 1.027 12/12/2015 0037   PHURINE 6.0 12/12/2015 0037   GLUCOSEU NEGATIVE 12/12/2015 0037   HGBUR NEGATIVE 12/12/2015 0037   BILIRUBINUR NEGATIVE 12/12/2015 0037    KETONESUR NEGATIVE 12/12/2015 0037   PROTEINUR 30 (A) 12/12/2015 0037   NITRITE POSITIVE (A) 12/12/2015 0037   LEUKOCYTESUR NEGATIVE 12/12/2015 0037    Radiological Exams on Admission: No results found.  Assessment/Plan Active Problems:   Essential hypertension   Asthma   Paroxysmal atrial fibrillation (HCC)   Hyperthyroidism   Malignant neoplasm of upper-outer quadrant of left breast in female, estrogen receptor positive ([place])  CKD (chronic kidney disease), stage III (HCC)   Cellulitis of left breast   Cellulitis of left abdominal wall    Left abdominal wall/left breast tenderness/possible left breast abscess: We will treat this with Unasyn however due to strong suspicion of left breast abscess as well as left abdominal wall abscess, I will proceed with CT of the abdomen and pelvis as well as ultrasound of the left breast.  Will obtain blood culture.  Check CBC and BMP as well as magnesium.  Paroxysmal atrial fibrillation: Currently in sinus rhythm.  Resume home medications.  Pharmacy consult for Coumadin.  Check INR.  Essential hypertension:.  Resume home medications.  Chronic kidney disease stage III: Check BMP.  DVT prophylaxis: Coumadin Code Status: DNR Family Communication: None present at bedside Disposition Plan: Likely home in next 2 to 3 days Consults called: None Admission status: Inpatient   Darliss Cheney MD [facility] Pager [phone]  If 7PM-7AM, please contact night-coverage [email or web address] Password [facility]  08/28/2018, 4:35 PM

## 2018-08-28 NOTE — Progress Notes (Addendum)
[place]   [phone] Fax:([phone]   Clinic Follow up Note   Patient Care Team: [name], [name], MD as PCP - General (Family Medicine) End, Harrell Gave, MD as PCP - Cardiology (Cardiology) [name], MD as Consulting Physician (Hematology) Kyung Rudd, MD as Consulting Physician (Radiation Oncology) Rolm Bookbinder, MD as Consulting Physician (General Surgery) Gardenia Phlegm, NP as Nurse Practitioner (Hematology and Oncology) 08/28/2018  CHIEF COMPLAINT: f/u left breast cellulitis   SUMMARY OF ONCOLOGIC HISTORY: Oncology History Overview Note  Cancer Staging Malignant neoplasm of upper-outer quadrant of left breast in female, estrogen receptor positive ([place]) Staging form: Breast, AJCC 8th Edition - Clinical stage from 12/13/2016: Stage IB (cT2, cN0, cM0, G2, ER: Positive, PR: Positive, HER2: Negative) - Signed by [name], MD on 12/21/2016 - Pathologic stage from 01/14/2017: Stage IA (pT2, pN0, cM0, G1, ER: Positive, PR: Positive, HER2: Negative, Oncotype DX score: 4) - Signed by [name], MD on 04/17/2017     Malignant neoplasm of upper-outer quadrant of left breast in female, estrogen receptor positive ([place])  12/06/2016 Mammogram   Diagnostic Mammogram 12/06/16 IMPRESSION:  Suspicious mass in the left breast at 3 o'clock 2 cm from the nipple measuring 1.9 x 1.1 x 2.2 cm. RECOMMENDATION: Ultrasound-guided core biopsy of the mass in the 3 o'clock region of the left breast is recommended. The biopsy will be scheduled at the patient's convenience.   12/13/2016 Initial Biopsy   Diagnosis 12/13/16 Breast, left, needle core biopsy, 3:00 o'clock, 2cmfn - INVASIVE DUCTAL CARCINOMA - SEE COMMENT   12/16/2016 Initial Diagnosis   Malignant neoplasm of upper-outer quadrant of left breast in female, estrogen receptor positive ([name])   12/17/2016 Receptors her2   Estrogen Receptor: 100%, POSITIVE, STRONG STAINING  INTENSITY Progesterone Receptor: 100%, POSITIVE, STRONG STAINING INTENSITY Proliferation Marker Ki67: 30% HER2 - NEGATIVE    01/14/2017 Surgery   Left breast lumpectomy with Dr. Donne Hazel   01/14/2017 Pathology Results   Diagnosis 01/14/17 1. Breast, lumpectomy, Left - INVASIVE DUCTAL CARCINOMA, GRADE I/III, SPANNING 2.1 CM. - DUCTAL CARCINOMA IN SITU, LOW GRADE. - INVASIVE CARCINOMA IS BROADLY PRESENT AT THE INFERIOR MARGIN OF SPECIMEN 1. - DUCTAL CARCINOMA IN SITU IS FOCALLY PRESENT AT THE INFERIOR MARGIN OF SPECIMEN 1 AND BROADLY LESS THAN 0.1 CM TO THE LATERAL MARGIN OF SPECIMEN 1. - SEE ONCOLOGY TABLE BELOW. 2. Breast, excision, Additional medial margin left - DUCTAL CARCINOMA IN SITU, LOW GRADE. - DUCTAL CARCINOMA IS FOCALLY LESS THAN 0.1 CM TO THE NEW MARGIN OF SPECIMEN 2. 3. Breast, excision, Additional lateral margin left - DUCTAL CARCINOMA IN SITU, LOW GRADE. - DUCTAL CARCINOMA IN SITU IS GREATER THAN 0.2 CM TO ALL MARGINS. 4. Breast, excision, Additional superior margin left - DUCTAL CARCINOMA IN SITU, LOW GRADE. - DUCTAL CARCINOMA IN SITU IS BROADLY LESS THAN 0.1 CM TO THE NEW MARGIN OF SPECIMEN 4. 5. Lymph node, sentinel, biopsy, Left axillary - THERE IS NO EVIDENCE OF CARCINOMA IN 1 OF 1 LYMPH NODE (0/1). 6. Breast, excision, Additional inferior margin left - DUCTAL CARCINOMA IN SITU, LOW GRADE. - DUCTAL CARCINOMA IN SITU IS GREATER THAN 0.2 CM TO ALL MARGINS.    01/14/2017 Oncotype testing   Her oncotype recurrence score is 4 and her distance recurrent on Tamoxifen alone is 3%.   03/08/2017 - 04/05/2017 Radiation Therapy   RT with Dr. Lisbeth Renshaw    06/2017 -  Anti-estrogen oral therapy   Tamoxifen daily   12/15/2017 Mammogram   12/15/2017 Mammogram IMPRESSION:  New lumpectomy site left breast. No mammographic evidence of malignancy in either breast.     CURRENT THERAPY: tamoxifen daily   INTERVAL HISTORY: [name] returns for work in visit for left  breast cellulitis. She was initially seen for this on 08/08/18, she was given oral doxycycline. She was seen on 08/10/18 and showed signs of improvement. I followed up with phone call last week and she thought breast was nearly at baseline after completing antibiotics. She woke up 1 day ago with recurrent left breast redness and warmth with tenderness at the nipple. Denies fever, chills, nipple discharge, or new breast lump. She denies injuring the breast.     MEDICAL HISTORY:  Past Medical History:  Diagnosis Date   Allergy    Anxiety    Aortic stenosis    Status post bioprosthetic AVR   Arthritis    DJD   Asthma    Breast cancer ([name]) 12/13/2016   Left breast   Chronic systolic CHF (congestive heart failure) ([facility])    EF 30% 04/2014   COPD (chronic obstructive pulmonary disease) ([facility])    Coronary artery disease    per pt, had LHC prior to AVR in [place] 2016 that did not show any blockages; no stents/bypass   Gastritis    Hyperlipidemia    Hypertension    Hyperthyroidism    Multiple thyroid nodules    Osteoporosis    Paroxysmal atrial fibrillation ([name])    Pelvis fracture ([name]) 08/19/2015   MULTIPLE    Personal history of radiation therapy 2018   Spontaneous pneumothorax 11/28/2015   left    Stroke ([place]) 2000   rt hand weak    SURGICAL HISTORY: Past Surgical History:  Procedure Laterality Date   BREAST LUMPECTOMY Left 12/2016   BREAST LUMPECTOMY WITH RADIOACTIVE SEED AND SENTINEL LYMPH NODE BIOPSY Left 01/14/2017   Procedure: LEFT BREAST LUMPECTOMY WITH RADIOACTIVE SEED AND SENTINEL LYMPH NODE BIOPSY;  Surgeon: Rolm Bookbinder, MD;  Location: [facility];  Service: General;  Laterality: Left;   CHEST TUBE INSERTION  10/2015   INTRAMEDULLARY (IM) NAIL INTERTROCHANTERIC Left 12/10/2015   Procedure: INTRAMEDULLARY (IM) NAIL INTERTROCHANTRIC;  Surgeon: Leandrew Koyanagi, MD;  Location: [facility];  Service: Orthopedics;  Laterality: Left;   PLEURADESIS Left  12/03/2015   Procedure: PLEURADESIS;  Surgeon: Melrose Nakayama, MD;  Location: [name];  Service: Thoracic;  Laterality: Left;   RESECTION OF APICAL BLEB Left 12/03/2015   Procedure: BLEBECTOMY;  Surgeon: Melrose Nakayama, MD;  Location: [name];  Service: Thoracic;  Laterality: Left;   THORACOSCOPY  12/03/2015   VALVE REPLACEMENT     VIDEO ASSISTED THORACOSCOPY Left 12/03/2015   Procedure: VIDEO ASSISTED THORACOSCOPY;  Surgeon: Melrose Nakayama, MD;  Location: [facility];  Service: Thoracic;  Laterality: Left;    I have reviewed the social history and family history with the patient and they are unchanged from previous note.  ALLERGIES:  is allergic to lisinopril and tetanus toxoid adsorbed.  MEDICATIONS:  No current facility-administered medications for this visit.    No current outpatient medications on file.   Facility-Administered Medications Ordered in Other Visits  Medication Dose Route Frequency Provider Last Rate Last Dose   0.9 %  sodium chloride infusion   Intravenous Continuous Pahwani, Ravi, MD       acetaminophen (TYLENOL) tablet 650 mg  650 mg Oral Q6H PRN Darliss Cheney, MD       Or   acetaminophen (TYLENOL) suppository 650 mg  650 mg Rectal  Q6H PRN Darliss Cheney, MD       albuterol (PROVENTIL) (2.5 MG/3ML) 0.083% nebulizer solution 2.5 mg  2.5 mg Inhalation Q6H PRN Darliss Cheney, MD       Ampicillin-Sulbactam (UNASYN) 3 g in sodium chloride 0.9 % 100 mL IVPB  3 g Intravenous Q6H Pahwani, Einar Grad, MD       aspirin EC tablet 81 mg  81 mg Oral Daily Pahwani, Ravi, MD       carvedilol (COREG) tablet 25 mg  25 mg Oral BID Pahwani, Einar Grad, MD       diphenhydramine-acetaminophen (TYLENOL PM) 25-500 MG per tablet 2 tablet  2 tablet Oral QHS PRN Darliss Cheney, MD       furosemide (LASIX) tablet 20 mg  20 mg Oral Daily Pahwani, Ravi, MD       gabapentin (NEURONTIN) capsule 600 mg  600 mg Oral QHS Pahwani, Einar Grad, MD       HYDROcodone-acetaminophen (NORCO/VICODIN) 5-325  MG per tablet 1-2 tablet  1-2 tablet Oral Q4H PRN Darliss Cheney, MD       losartan (COZAAR) tablet 50 mg  50 mg Oral Daily Pahwani, Einar Grad, MD       methimazole (TAPAZOLE) tablet 15 mg  15 mg Oral QPM Pahwani, Ravi, MD       mometasone-formoterol (DULERA) 200-5 MCG/ACT inhaler 2 puff  2 puff Inhalation BID Darliss Cheney, MD       ondansetron (ZOFRAN) tablet 4 mg  4 mg Oral Q6H PRN Darliss Cheney, MD       Or   ondansetron (ZOFRAN) injection 4 mg  4 mg Intravenous Q6H PRN Pahwani, Einar Grad, MD       rosuvastatin (CRESTOR) tablet 40 mg  40 mg Oral Daily Pahwani, Ravi, MD       senna-docusate (Senokot-S) tablet 2 tablet  2 tablet Oral QHS Darliss Cheney, MD       tamoxifen (NOLVADEX) tablet 20 mg  20 mg Oral Daily Pahwani, Ravi, MD       traMADol (ULTRAM) tablet 50 mg  50 mg Oral TID Darliss Cheney, MD       valACYclovir (VALTREX) tablet 500 mg  500 mg Oral BID Darliss Cheney, MD        PHYSICAL EXAMINATION: ECOG PERFORMANCE STATUS: 1 - Symptomatic but completely ambulatory  Vitals:   08/28/18 1402  BP: 139/67  Pulse: 91  Resp: 20  Temp: 99.6 F (37.6 C)  SpO2: 96%   Filed Weights   08/28/18 1402  Weight: 252 lb 11.2 oz (114.6 kg)    GENERAL: alert, no distress, uncomfortable. Presents in wheelchair  EYES:sclera clear LUNGS: respirations even and unlabored  Musculoskeletal: no upper or lower extremity edema   NEURO: alert & oriented x 3 with fluent speech BREAST EXAM: left breast and chest wall with diffuse erythema and edema, dry skin peeling at the central portion left of the nipple. No palpable mass that I could appreciate.         LABORATORY DATA:  None for this visit  RADIOGRAPHIC STUDIES: I have personally reviewed the radiological images as listed and agreed with the findings in the report. No results found.   ASSESSMENT & PLAN:   1. Cellulitis of the left breast and chest wall  -Ms. McEachean presented on 6/9 with left breast cellulitis s/p 1 week course of  oral doxycycline 08/08/18 - 08/14/18, she responded well. She developed recurrence symptoms 1 day ago. Tmax 99.6, no chills. The patient was seen with Dr. Burr Medico. She will  be directly admitted to [facility] for inpatient management, work up and management likely to include breast ultrasound to r/o abscess, cultures, and IV antibiotics. The patient agrees with the plan. She denies previous testing or exposure to covid19 contacts. Will follow.   2.Malignant neoplasm of upper-outer quadrant of left breast, Stage IB, c(T2,N0,M0 ), ER/PR: POSITIVE, HER2: NEGATIVE, Grade II. Oncotype 4 -s/p lumpectomy 12/2016 and adjuvant radiation 03/2017 - 04/2017, began tamoxifen 06/2017 and tolerating well -no palpable mass on today's exam -Continue tamoxifen -Mammogram and DEXA due 11/2018   3. CAD, Afib, CHF, Aortic Stenosis, H/o Stroke -On Coreg, Coumadin -clinically stable -INR closely monitored on recent antibiotics -f/u with PCP   PLAN: -Direct hospital admission for management of left breast/chest wall cellulitis   All questions were answered. The patient knows to call the clinic with any problems, questions or concerns. No barriers to learning was detected.     Alla Feeling, NP 08/28/18   Addendum  I have seen the patient, examined her. I agree with the assessment and and plan and have edited the notes.   Pt  Has recurrent left breast and chest wall cellulitis. She responded well to doxycycline the first time, but it recurred again. Given the extensive cellulitis, and recurrent episode, I recommend hospital admission for iv antibiotics, and [place] of left breast to rule out abscess. She agrees. I called [facility] hospitalist team and they have kindly agreed to admit her, I will f/u her in the hospital.   [name]  08/28/2018

## 2018-08-28 NOTE — Progress Notes (Signed)
ANTICOAGULATION CONSULT NOTE - Initial Consult  Pharmacy Consult for warfarin Indication: atrial fibrillation  Allergies  Allergen Reactions  . Lisinopril Cough  . Tetanus Toxoid Adsorbed Swelling    Arm swelling    Patient Measurements: Height: 5\' 7"  (170.2 cm) Weight: 251 lb 8 oz (114.1 kg) IBW/kg (Calculated) : 61.6  Vital Signs: Temp: 100.2 F (37.9 C) (06/29 1803) Temp Source: Oral (06/29 1803) BP: 141/71 (06/29 1803) Pulse Rate: 88 (06/29 1803)  Labs: Recent Labs    08/28/18 1725  HGB 12.2  HCT 40.3  PLT 82*  CREATININE 1.04*    Estimated Creatinine Clearance: 63.8 mL/min (A) (by C-G formula based on SCr of 1.04 mg/dL (H)).   Medical History: Past Medical History:  Diagnosis Date  . Allergy   . Anxiety   . Aortic stenosis    Status post bioprosthetic AVR  . Arthritis    DJD  . Asthma   . Breast cancer ([place]) 12/13/2016   Left breast  . Chronic systolic CHF (congestive heart failure) ([place])    EF 30% 04/2014  . COPD (chronic obstructive pulmonary disease) ([place])   . Coronary artery disease    per pt, had LHC prior to AVR in [place] that did not show any blockages; no stents/bypass  . Gastritis   . Hyperlipidemia   . Hypertension   . Hyperthyroidism   . Multiple thyroid nodules   . Osteoporosis   . Paroxysmal atrial fibrillation ([facility])   . Pelvis fracture ([place]) 08/19/2015   MULTIPLE   . Personal history of radiation therapy 2018  . Spontaneous pneumothorax 11/28/2015   left   . Stroke [facility]) 2000   rt hand weak   Assessment: Pt admitted with concern for left abdominal wall abscess and started on ampicillin/sulbactam. Pharmacy consulted to dose/monitor warfarin. Pt was taking PTA for atrial fibrillation.  PTA dose (confirmed with outpatient notes):  Warfarin 1.5 mg PO on Wednesday  Warfarin 3 mg PO all other days of the week  Last dose PTA: 6/28 @ 2200 INR on admission: 1.9  Today, 08/28/18  Hgb 12.2  Plt 82 - low  INR 1.9 is  therapeutic  No new medications ordered that have significant drug interaction with warfarin  Goal of Therapy:  INR 2-3 Monitor platelets by anticoagulation protocol: Yes   Plan:   Warfarin 3 mg PO once this evening  INR daily while inpatient and on antibiotics  CBC with AM labs tomorrow  Lenis Noon, PharmD 08/28/2018,7:18 PM

## 2018-08-29 ENCOUNTER — Ambulatory Visit: Payer: Medicare Other | Admitting: Nurse Practitioner

## 2018-08-29 ENCOUNTER — Inpatient Hospital Stay (HOSPITAL_COMMUNITY): Payer: Medicare Other

## 2018-08-29 ENCOUNTER — Telehealth: Payer: Self-pay | Admitting: Nurse Practitioner

## 2018-08-29 DIAGNOSIS — Z9889 Other specified postprocedural states: Secondary | ICD-10-CM

## 2018-08-29 DIAGNOSIS — Z872 Personal history of diseases of the skin and subcutaneous tissue: Secondary | ICD-10-CM

## 2018-08-29 DIAGNOSIS — C50912 Malignant neoplasm of unspecified site of left female breast: Secondary | ICD-10-CM

## 2018-08-29 DIAGNOSIS — Z888 Allergy status to other drugs, medicaments and biological substances status: Secondary | ICD-10-CM

## 2018-08-29 DIAGNOSIS — L03313 Cellulitis of chest wall: Secondary | ICD-10-CM

## 2018-08-29 DIAGNOSIS — Z887 Allergy status to serum and vaccine status: Secondary | ICD-10-CM

## 2018-08-29 DIAGNOSIS — Z87891 Personal history of nicotine dependence: Secondary | ICD-10-CM

## 2018-08-29 DIAGNOSIS — I509 Heart failure, unspecified: Secondary | ICD-10-CM

## 2018-08-29 DIAGNOSIS — L0291 Cutaneous abscess, unspecified: Secondary | ICD-10-CM

## 2018-08-29 DIAGNOSIS — D696 Thrombocytopenia, unspecified: Secondary | ICD-10-CM

## 2018-08-29 DIAGNOSIS — N61 Mastitis without abscess: Principal | ICD-10-CM

## 2018-08-29 DIAGNOSIS — I2510 Atherosclerotic heart disease of native coronary artery without angina pectoris: Secondary | ICD-10-CM

## 2018-08-29 DIAGNOSIS — N183 Chronic kidney disease, stage 3 (moderate): Secondary | ICD-10-CM

## 2018-08-29 DIAGNOSIS — I428 Other cardiomyopathies: Secondary | ICD-10-CM

## 2018-08-29 DIAGNOSIS — Z853 Personal history of malignant neoplasm of breast: Secondary | ICD-10-CM

## 2018-08-29 DIAGNOSIS — Z923 Personal history of irradiation: Secondary | ICD-10-CM

## 2018-08-29 DIAGNOSIS — I5022 Chronic systolic (congestive) heart failure: Secondary | ICD-10-CM

## 2018-08-29 DIAGNOSIS — R7881 Bacteremia: Secondary | ICD-10-CM

## 2018-08-29 LAB — COMPREHENSIVE METABOLIC PANEL
ALT: 10 U/L (ref 0–44)
AST: 10 U/L — ABNORMAL LOW (ref 15–41)
Albumin: 3 g/dL — ABNORMAL LOW (ref 3.5–5.0)
Alkaline Phosphatase: 36 U/L — ABNORMAL LOW (ref 38–126)
Anion gap: 7 (ref 5–15)
BUN: 15 mg/dL (ref 8–23)
CO2: 21 mmol/L — ABNORMAL LOW (ref 22–32)
Calcium: 8.4 mg/dL — ABNORMAL LOW (ref 8.9–10.3)
Chloride: 112 mmol/L — ABNORMAL HIGH (ref 98–111)
Creatinine, Ser: 1.18 mg/dL — ABNORMAL HIGH (ref 0.44–1.00)
GFR calc Af Amer: 53 mL/min — ABNORMAL LOW (ref >60)
GFR calc non Af Amer: 46 mL/min — ABNORMAL LOW (ref >60)
Glucose, Bld: 92 mg/dL (ref 70–99)
Potassium: 3.7 mmol/L (ref 3.5–5.1)
Sodium: 140 mmol/L (ref 135–145)
Total Bilirubin: 0.7 mg/dL (ref 0.3–1.2)
Total Protein: 5.8 g/dL — ABNORMAL LOW (ref 6.5–8.1)

## 2018-08-29 LAB — BLOOD CULTURE ID PANEL (REFLEXED)

## 2018-08-29 LAB — PROTIME-INR
INR: 2 — ABNORMAL HIGH (ref 0.8–1.2)
Prothrombin Time: 22 seconds — ABNORMAL HIGH (ref 11.4–15.2)

## 2018-08-29 LAB — CBC
HCT: 34.1 % — ABNORMAL LOW (ref 36.0–46.0)
Hemoglobin: 10.7 g/dL — ABNORMAL LOW (ref 12.0–15.0)
MCH: 30.9 pg (ref 26.0–34.0)
MCHC: 31.4 g/dL (ref 30.0–36.0)
MCV: 98.6 fL (ref 80.0–100.0)
Platelets: 73 10*3/uL — ABNORMAL LOW (ref 150–400)
RBC: 3.46 MIL/uL — ABNORMAL LOW (ref 3.87–5.11)
RDW: 14.8 % (ref 11.5–15.5)
WBC: 8.4 10*3/uL (ref 4.0–10.5)
nRBC: 0 % (ref 0.0–0.2)

## 2018-08-29 LAB — NOVEL CORONAVIRUS, NAA (HOSP ORDER, SEND-OUT TO REF LAB; TAT 18-24 HRS): SARS-CoV-2, NAA: NOT DETECTED

## 2018-08-29 MED ORDER — SODIUM CHLORIDE 0.9 % IV BOLUS
250.0000 mL | Freq: Once | INTRAVENOUS | Status: AC
  Administered 2018-08-29: 250 mL via INTRAVENOUS

## 2018-08-29 MED ORDER — WARFARIN SODIUM 1 MG PO TABS: 1.5000 mg | ORAL_TABLET | ORAL | Status: DC

## 2018-08-29 MED ORDER — VANCOMYCIN HCL IN DEXTROSE 1-5 GM/200ML-% IV SOLN: 1000.0000 mg | INTRAVENOUS | Status: DC

## 2018-08-29 MED ORDER — VANCOMYCIN HCL 10 G IV SOLR
2000.0000 mg | Freq: Once | INTRAVENOUS | Status: AC
  Administered 2018-08-29: 2000 mg via INTRAVENOUS
  Filled 2018-08-29: qty 2000

## 2018-08-29 MED ORDER — WARFARIN SODIUM 3 MG PO TABS
3.0000 mg | ORAL_TABLET | ORAL | Status: DC
  Administered 2018-08-29: 3 mg via ORAL
  Filled 2018-08-29: qty 1

## 2018-08-29 NOTE — Progress Notes (Signed)
ANTICOAGULATION CONSULT NOTE - Initial Consult  Pharmacy Consult for warfarin Indication: atrial fibrillation  Allergies  Allergen Reactions  . Lisinopril Cough  . Tetanus Toxoid Adsorbed Swelling    Arm swelling    Patient Measurements: Height: 5\' 7"  (170.2 cm) Weight: 254 lb 3.1 oz (115.3 kg) IBW/kg (Calculated) : 61.6  Vital Signs: Temp: 98.6 F (37 C) (06/30 0818) Temp Source: Oral (06/30 0818) BP: 117/76 (06/30 0818) Pulse Rate: 85 (06/30 0818)  Labs: Recent Labs    08/28/18 1725 08/28/18 1908 08/29/18 0442  HGB 12.2  --  10.7*  HCT 40.3  --  34.1*  PLT 82*  --  73*  LABPROT  --  21.3* 22.0*  INR  --  1.9* 2.0*  CREATININE 1.04*  --  1.18*    Estimated Creatinine Clearance: 56.5 mL/min (A) (by C-G formula based on SCr of 1.18 mg/dL (H)).   Medical History: Past Medical History:  Diagnosis Date  . Allergy   . Anxiety   . Aortic stenosis    Status post bioprosthetic AVR  . Arthritis    DJD  . Asthma   . Breast cancer ([place]) 12/13/2016   Left breast  . Chronic systolic CHF (congestive heart failure) ([place])    EF 30% 04/2014  . COPD (chronic obstructive pulmonary disease) ([place])   . Coronary artery disease    per pt, had LHC prior to AVR in [place] that did not show any blockages; no stents/bypass  . Gastritis   . Hyperlipidemia   . Hypertension   . Hyperthyroidism   . Multiple thyroid nodules   . Osteoporosis   . Paroxysmal atrial fibrillation ([facility])   . Pelvis fracture ([place]) 08/19/2015   MULTIPLE   . Personal history of radiation therapy 2018  . Spontaneous pneumothorax 11/28/2015   left   . Stroke [facility]) 2000   rt hand weak   Assessment: Pt admitted with concern for left abdominal wall abscess and started on ampicillin/sulbactam. Pharmacy consulted to dose/monitor warfarin. Pt was taking PTA for atrial fibrillation.  PTA dose (confirmed with outpatient notes):  Warfarin 1.5 mg PO on Wednesday  Warfarin 3 mg PO all other days of the  week  INR 2 today. We will resume her home dose. If INR remains stable, we will change it to MWF  Goal of Therapy:  INR 2-3 Monitor platelets by anticoagulation protocol: Yes   Plan:   Warfarin 3 mg PO qday except 1.5mg  Wed  INR daily while inpatient and on antibiotics  CBC with AM labs tomorrow  Onnie Boer, PharmD, [place], AAHIVP, CPP Infectious Disease Pharmacist 08/29/2018 10:14 AM

## 2018-08-29 NOTE — Progress Notes (Signed)
°PROGRESS NOTE ° ° ° °Danielle [name]  MRN:[id number] DOB: 05/03/1945 DOA: 08/28/2018 °PCP: [name], [name], MD  ° ° °Brief Narrative:  °HPI per Dr.Pahwani °Danielle [name] is a 73 y.o. female with medical history significant of aortic stenosis status post bioprosthetic aVR, asthma, breast cancer, COPD, CAD, hypertension and hypothyroidism is being directly admitted for left abdominal wall as well as left breast cellulitis.  I received a phone call from patient's primary oncologist Dr. Feng that patient presented to their clinic 2 weeks ago and was diagnosed with left abdominal wall as well as left breast cellulitis and she was treated with a 10-day course of oral doxycycline which had resolved the symptoms but soon after she completed the course, her redness reappeared again and she went to see her oncologist again today.  This looked very worse than prior so direct admission was requested.  Patient has no other complaint other than pain and redness.  She denies any chest pain, cough, shortness of breath, fever, chills or sweating. °  °ED Course: Directly admitted through oncologist office. ° °Assessment & Plan: °  °Principal Problem: °  Cellulitis of left breast °Active Problems: °  Cellulitis of left abdominal wall °  Essential hypertension °  Asthma °  Paroxysmal atrial fibrillation (HCC) °  Chronic systolic (congestive) heart failure (HCC) °  Hyperthyroidism °  Morbid obesity due to excess calories (HCC) °  NICM (nonischemic cardiomyopathy) (HCC) °  Malignant neoplasm of upper-outer quadrant of left breast in female, estrogen receptor positive (HCC) °  CKD (chronic kidney disease), stage III (HCC) °  Bacteremia due to Gram-positive bacteria ° °1 left breast cellulitis/left upper abdominal wall cellulitis/??  Abscess °Patient admitted after recurrence of left breast cellulitis and left upper abdominal wall cellulitis after 10 days of oral doxycycline.  Patient with history of breast  cancer.  Concern for possible abscess formation.  CT abdomen and pelvis which was done showed skin thickening of the left breast with more supple skin thickening adjacent to the left upper quadrant of the abdomen with no underlying cellulitis or abscess noted.  Small fluid collection deep within the left breast probably seroma from previous breast surgery.  Ultrasound of the left breast with fluid collection deep within the 6:00 location of the left breast favored to represent seroma cavity.  Skin and trabecular thickening in the central portion of the breast no discrete fluid collections are identified to indicate presence of abscess.  Skin and trabecular thickening could be related to posttreatment changes or mastitis.  Due to recurrent left breast cellulitis despite outpatient treatment with doxycycline with some improvement and recurrence in the setting of a history of breast cancer will consult with ID for further evaluation and management.  Patient seen in consultation by ID who recommended changing IV Unasyn to IV vancomycin.  ID also recommending surgical input and a surgical consult with general surgery. ° °2.  Bacteremia due to Gram-positive bacteria °Preliminary blood cultures concerning for gram-positive cocci.  Patient was on IV Unasyn and per ID recommendations has been changed to IV vancomycin.  Continue IV vancomycin pending sensitivities.  ID following and appreciate input and recommendations. ° °3.  Paroxysmal atrial fibrillation °Currently in sinus rhythm.  Continue home regimen Coreg for rate control.  Coumadin for anticoagulation.  Follow. ° °4.  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  Patient   began tamoxifen in May 2019 and currently tolerating it.  Continue tamoxifen.  Oncology following. ° °5.  Coronary artery disease/aortic  stenosis/history of chronic systolic CHF due to nonischemic cardiomyopathy °Currently stable.  Continue home regimen of Coreg, Lasix, Cozaar, aspirin, Crestor, Coumadin. ° °6.  Chronic pain °Continue home pain regimen.  Continue Neurontin, Ultram. ° °7.  Chronic kidney disease stage III °Stable.  Follow. ° °8.  Hypertension °Stable.  Continue Coreg, Lasix, Cozaar. ° °9.  History of recurrent genital herpes °Continue Valtrex. ° °10.  Hyperthyroidism °Continue methimazole.  Outpatient follow-up with PCP. ° ° °DVT prophylaxis: Coumadin °Code Status: DNR °Family Communication: Updated patient.  No family at bedside. °Disposition Plan: Likely home once clinically improved. ° ° °Consultants:  °· Infectious disease: Dr. Campbell 08/29/2018 °· General surgery pending °· Oncology: Dr. Feng 08/29/2018 ° °Procedures: °· CT abdomen and pelvis 08/28/2018 °· Breast ultrasound 08/29/2018 ° °Antimicrobials:  °· IV Unasyn 08/28/2018>>>> 08/29/2018 °· IV vancomycin 08/29/2018 ° ° °Subjective: °Patient laying in bed.  Patient states some improvement with warmth of left breast cellulitis.  Denies any chest pain or shortness of breath. ° °Objective: °Vitals:  ° 08/29/18 0306 08/29/18 0459 08/29/18 0818 08/29/18 1523  °BP: 107/64 99/61 117/76 133/72  °Pulse: 80 84 85 78  °Resp:  20 20 20  °Temp:  98.9 °F (37.2 °C) 98.6 °F (37 °C) 98.7 °F (37.1 °C)  °TempSrc:  Oral Oral Oral  °SpO2:  97% 100% 99%  °Weight:  115.3 kg    °Height:      ° ° °Intake/Output Summary (Last 24 hours) at 08/29/2018 1705 °Last data filed at 08/29/2018 1400 °Gross per 24 hour  °Intake 2181.31 ml  °Output --  °Net 2181.31 ml  ° °Filed Weights  ° 08/28/18 1554 08/29/18 0459  °Weight: 114.1 kg 115.3 kg  ° ° °Examination: ° °General exam: Appears calm and comfortable  °Respiratory system: Clear to auscultation. Respiratory effort normal. °Cardiovascular system: S1 & S2 heard, RRR. No JVD, murmurs, rubs, gallops or clicks. No pedal edema. °Breasts: Left breast with 80 to 90%,  erythematous, warmth, some tenderness to palpation, some induration noted on the lateral aspect of the left breast lateral to the nipple.  No nipple discharge. °Gastrointestinal system: Abdomen is nondistended, soft and nontender. No organomegaly or masses felt. Normal bowel sounds heard. °Central nervous system: Alert and oriented. No focal neurological deficits. °Extremities: Symmetric 5 x 5 power. °Skin: Left breast with 80 to 90% erythema with a significant large decreasing area of left abdominal wall erythema with some warmth. °Psychiatry: Judgement and insight appear normal. Mood & affect appropriate.  ° ° ° °Data Reviewed: I have personally reviewed following labs and imaging studies ° °CBC: °Recent Labs  °Lab 08/28/18 °1725 08/29/18 °0442  °WBC 10.9* 8.4  °NEUTROABS 9.0*  --   °HGB 12.2 10.7*  °HCT 40.3 34.1*  °MCV 99.8 98.6  °PLT 82* 73*  ° °Basic Metabolic Panel: °Recent Labs  °Lab 08/28/18 °1725 08/29/18 °0442  °NA 140 140  °K 3.9 3.7  °CL 111 112*  °CO2 21* 21*  °GLUCOSE 90 92  °BUN 13 15  °CREATININE 1.04* 1.18*  °CALCIUM 9.3 8.4*  °MG 2.1  --   ° °GFR: °Estimated Creatinine Clearance: 56.5 mL/min (A) (by C-G formula based on SCr of 1.18 mg/dL (H)). °Liver Function Tests: °Recent Labs  °Lab 08/29/18 °0442  °AST 10*  °ALT 10  °ALKPHOS 36*  °BILITOT 0.7  °PROT 5.8*  °ALBUMIN 3.0*  ° °No results for   input(s): LIPASE, AMYLASE in the last 168 hours. °No results for input(s): AMMONIA in the last 168 hours. °Coagulation Profile: °Recent Labs  °Lab 08/28/18 °1908 08/29/18 °0442  °INR 1.9* 2.0*  ° °Cardiac Enzymes: °No results for input(s): CKTOTAL, CKMB, CKMBINDEX, TROPONINI in the last 168 hours. °BNP (last 3 results) °No results for input(s): PROBNP in the last 8760 hours. °HbA1C: °Recent Labs  °  08/28/18 °1725  °HGBA1C 5.0  ° °CBG: °No results for input(s): GLUCAP in the last 168 hours. °Lipid Profile: °No results for input(s): CHOL, HDL, LDLCALC, TRIG, CHOLHDL, LDLDIRECT in the last 72 hours. °Thyroid  Function Tests: °No results for input(s): TSH, T4TOTAL, FREET4, T3FREE, THYROIDAB in the last 72 hours. °Anemia Panel: °No results for input(s): VITAMINB12, FOLATE, FERRITIN, TIBC, IRON, RETICCTPCT in the last 72 hours. °Sepsis Labs: °No results for input(s): PROCALCITON, LATICACIDVEN in the last 168 hours. ° °Recent Results (from the past 240 hour(s))  °Blood Cultures x 2 sites     Status: None (Preliminary result)  ° Collection Time: 08/28/18  5:25 PM  ° Specimen: BLOOD RIGHT HAND  °Result Value Ref Range Status  ° Specimen Description   Final  °  BLOOD RIGHT HAND °Performed at [facility], [address] [place], [place] [address] °  ° Special Requests   Final  °  BOTTLES DRAWN AEROBIC ONLY Blood Culture adequate volume °Performed at [facility], [address] [place], [place] [address] °  ° Culture   Final  °  NO GROWTH < 24 HOURS °Performed at [facility], [address] [place], [place] [address] °  ° Report Status PENDING  Incomplete  °Blood Cultures x 2 sites     Status: None (Preliminary result)  ° Collection Time: 08/28/18  5:25 PM  ° Specimen: BLOOD RIGHT HAND  °Result Value Ref Range Status  ° Specimen Description   Final  °  BLOOD RIGHT HAND °Performed at [facility], [address] [place], [place] [address] °  ° Special Requests   Final  °  BOTTLES DRAWN AEROBIC ONLY Blood Culture results may not be optimal due to an inadequate volume of blood received in culture bottles °Performed at [facility], [address] [place], [place] [address] °  ° Culture  Setup Time   Final  °  GRAM POSITIVE COCCI °AEROBIC BOTTLE ONLY °Organism ID to follow °CRITICAL RESULT CALLED TO, READ BACK BY AND VERIFIED WITH: M. Swayne PharmD 14:40 08/29/18 ([email or web address]) °Performed at [facility], [address] [place], [place] [address] °  ° Culture GRAM POSITIVE COCCI  Final  ° Report Status PENDING  Incomplete  °Blood Culture ID  Panel (Reflexed)     Status: Abnormal  ° Collection Time: 08/28/18  5:25 PM  °Result Value Ref Range Status  ° Enterococcus species NOT DETECTED NOT DETECTED Final  ° Listeria monocytogenes NOT DETECTED NOT DETECTED Final  ° Staphylococcus species DETECTED (A) NOT DETECTED Final  °  Comment: Methicillin (oxacillin) susceptible coagulase negative staphylococcus. Possible blood culture contaminant (unless isolated from more than one blood culture draw or clinical case suggests pathogenicity). No antibiotic treatment is indicated for blood  °culture contaminants. °CRITICAL RESULT CALLED TO, READ BACK BY AND VERIFIED WITH: °M. Swayne PharmD 14:40 08/29/18 ([email or web address]) °  ° Staphylococcus aureus (BCID) NOT DETECTED NOT DETECTED Final  ° Methicillin resistance NOT DETECTED NOT DETECTED Final  ° Streptococcus species NOT DETECTED NOT DETECTED Final  °   Streptococcus agalactiae NOT DETECTED NOT DETECTED Final  ° Streptococcus pneumoniae NOT DETECTED NOT DETECTED Final  ° Streptococcus pyogenes NOT DETECTED NOT DETECTED Final  ° Acinetobacter baumannii NOT DETECTED NOT DETECTED Final  ° Enterobacteriaceae species NOT DETECTED NOT DETECTED Final  ° Enterobacter cloacae complex NOT DETECTED NOT DETECTED Final  ° Escherichia coli NOT DETECTED NOT DETECTED Final  ° Klebsiella oxytoca NOT DETECTED NOT DETECTED Final  ° Klebsiella pneumoniae NOT DETECTED NOT DETECTED Final  ° Proteus species NOT DETECTED NOT DETECTED Final  ° Serratia marcescens NOT DETECTED NOT DETECTED Final  ° Haemophilus influenzae NOT DETECTED NOT DETECTED Final  ° Neisseria meningitidis NOT DETECTED NOT DETECTED Final  ° Pseudomonas aeruginosa NOT DETECTED NOT DETECTED Final  ° Candida albicans NOT DETECTED NOT DETECTED Final  ° Candida glabrata NOT DETECTED NOT DETECTED Final  ° Candida krusei NOT DETECTED NOT DETECTED Final  ° Candida parapsilosis NOT DETECTED NOT DETECTED Final  ° Candida tropicalis NOT DETECTED NOT DETECTED Final  °  Comment: Performed at  [facility], [address] [place], [place] [address]  °Novel Coronavirus,NAA,(SEND-OUT TO REF LAB - TAT 24-48 hrs); Hosp Order     Status: None  ° Collection Time: 08/28/18  5:41 PM  ° Specimen: Nasopharyngeal Swab; Respiratory  °Result Value Ref Range Status  ° SARS-CoV-2, NAA NOT DETECTED NOT DETECTED Final  °  Comment: (NOTE) °This test was developed and its performance characteristics °determined by [facility]. This test has not been FDA °cleared or approved. This test has been authorized by FDA under an °Emergency Use Authorization (EUA). This test is only authorized for °the duration of time the declaration that circumstances exist °justifying the authorization of the emergency use of in vitro °diagnostic tests for detection of SARS-CoV-2 virus and/or diagnosis °of COVID-19 infection under section 564(b)(1) of the Act, 21 U.S.C. °360bbb-3(b)(1), unless the authorization is terminated or revoked °sooner. When diagnostic testing is negative, the possibility of a °false negative result should be considered in the context of a °patient's recent exposures and the presence of clinical signs and °symptoms consistent with COVID-19. An individual without symptoms of °COVID-19 and who is not shedding SARS-CoV-2 virus would expect to °have a negative (not detected) result in this assay. °Performed  °At: [facility] °[address] , [place] [id number] °Nagendra Sanjai MD Ph:[phone] °  ° Coronavirus Source NASOPHARYNGEAL  Final  °  Comment: Performed at [facility], [address] [place], [place] [address]  °  ° ° ° ° ° °Radiology Studies: °Ct Abdomen Wo Contrast ° °Result Date: 08/28/2018 °CLINICAL DATA:  Left upper abdominal tenderness and pain. Cellulitis. Erysipelas. EXAM: CT ABDOMEN WITHOUT CONTRAST TECHNIQUE: Multidetector CT imaging of the abdomen was performed following the standard protocol without IV contrast. COMPARISON:  None. FINDINGS: Lower chest:  There is thickening of the skin of the left breast. There is a 4 x 2 cm fluid collection deep in the left breast adjacent to the chest wall with a small metallic marker adjacent to the collection. This probably represents a seroma from previous breast surgery. There is slight thickening of the skin along the left lateral aspect of the upper abdomen without underlying cellulitis or abscess. No infiltrates or effusions. Small emphysematous blebs at both lung bases. Aortic atherosclerosis. Hepatobiliary: Numerous small gallstones. 18 mm low-density lesion in the right lobe of the liver adjacent to the gallbladder consistent with a cyst. 8 mm low-density lesion in the small left lobe of the   liver, also probably a cyst. No dilated bile ducts. Pancreas: Unremarkable. No pancreatic ductal dilatation or surrounding inflammatory changes. Spleen: Normal in size without focal abnormality. Adrenals/Urinary Tract: Normal adrenal glands. Small exophytic cysts in the left kidney. No hydronephrosis. Stomach/Bowel: Small hiatal hernia. Scattered diverticula in the splenic flexure of the colon. Visualized small bowel is normal. Vascular/Lymphatic: Aortic atherosclerosis. No enlarged abdominal lymph nodes. Slightly dilated left common iliac artery to 17 mm. Other: No ascites, free air, or abdominal wall hernia. Musculoskeletal: Multilevel degenerative disc disease in the lumbar spine. No acute bony abnormality. IMPRESSION: 1. Skin thickening of the left breast and more subtle skin thickening adjacent to the left upper quadrant of the abdomen with no underlying cellulitis or abscess. 2. Small fluid collection deep within the left breast is probably a seroma from previous breast surgery. 3. Cholelithiasis. 4.  Aortic Atherosclerosis (ICD10-I70.0). 5. 17 mm dilatation of the left common iliac artery. Electronically Signed   By: Lorriane Shire M.D.   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 CT exam performed 08/28/2018 shows a 4 x 2 centimeter fluid collection deep in the LEFT breast adjacent to the chest wall. Skin thickening of the LEFT breast was also noted on CT exam. Patient has diffuse erythema and pain in the LEFT breast. EXAM: ULTRASOUND OF THE LEFT BREAST COMPARISON:  CT exam on 08/28/2018 and multiple previous breast imaging studies, including diagnostic 12/15/2017. FINDINGS: Targeted ultrasound is performed, showing an near anechoic irregular mass with irregular margins in the 6 o'clock location of the LEFT breast, posterior in location which measures 2.2 x 4.2 x 1.5 centimeters. There is no associated internal blood flow. There is skin and trabecular thickening of the LEFT breast. IMPRESSION: Fluid collection deep within the 6 o'clock location of the LEFT breast is favored to represent seroma cavity. Although there is skin and trabecular thickening in the central portion of the breast, no discrete fluid collections are identified to indicate presence of abscess. The skin and trabecular thickening could be related to post treatment changes or mastitis. RECOMMENDATION: Appropriate treatment if needed for possible mastitis. Recommend follow-up LEFT mammogram and ultrasound at a Breast Imaging facility. I have discussed the findings and recommendations with the patient. Results were also provided in writing at the conclusion of the visit. If applicable, a reminder letter will be sent to the patient regarding the next appointment. Electronically Signed   By: Nolon Nations M.D.   On: 08/29/2018 11:24        Scheduled Meds:  aspirin EC  81 mg Oral Daily   carvedilol  25 mg Oral BID   furosemide  20 mg Oral Daily   gabapentin  600 mg Oral QHS   losartan  50 mg Oral Daily   methimazole  15 mg Oral QPM   mometasone-formoterol  2 puff Inhalation BID   rosuvastatin  40 mg Oral  Daily   senna-docusate  2 tablet Oral QHS   tamoxifen  20 mg Oral Daily   traMADol  50 mg Oral TID   valACYclovir  500 mg Oral BID   [START ON 08/30/2018] warfarin  1.5 mg Oral Once per day on Wed   warfarin  3 mg Oral Once per day on Sun Mon Tue Thu Fri Sat   Warfarin - Pharmacist Dosing Inpatient   Does not apply q1800   Continuous Infusions:  sodium chloride 75 mL/hr at 08/29/18 1010  °• vancomycin 2,000 mg (08/29/18 1630)  °• [START ON 08/30/2018] vancomycin    ° ° ° LOS: 1 day  ° ° °Time spent: 40 minutes ° ° ° °Daniel Thompson, MD °[facility] ° °If 7PM-7AM, please contact night-coverage [email or web address] °08/29/2018, 5:05 PM  °

## 2018-08-29 NOTE — Progress Notes (Signed)
PHARMACY - PHYSICIAN COMMUNICATION CRITICAL VALUE ALERT - BLOOD CULTURE IDENTIFICATION (BCID)  Danielle [name] is an 73 y.o. female who presented to [facility] on 08/28/2018 with a chief complaint of recurrent cellulitis.  Assessment:  BCID + 1/2 GPC, growing staphylococcus species mecA NOT detected  Name of physician (or Provider) Contacted: Dr. Megan Salon and Dr. Grandville Silos  Current antibiotics: Ampicillin/sulbactam  Changes to prescribed antibiotics: Discontinue ampicillin/sulbactam and start vancomycin.   Results for orders placed or performed during the hospital encounter of 08/28/18  Blood Culture ID Panel (Reflexed) (Collected: 08/28/2018  5:25 PM)  Result Value Ref Range   Enterococcus species NOT DETECTED NOT DETECTED   Listeria monocytogenes NOT DETECTED NOT DETECTED   Staphylococcus species DETECTED (A) NOT DETECTED   Staphylococcus aureus (BCID) NOT DETECTED NOT DETECTED   Methicillin resistance NOT DETECTED NOT DETECTED   Streptococcus species NOT DETECTED NOT DETECTED   Streptococcus agalactiae NOT DETECTED NOT DETECTED   Streptococcus pneumoniae NOT DETECTED NOT DETECTED   Streptococcus pyogenes NOT DETECTED NOT DETECTED   Acinetobacter baumannii NOT DETECTED NOT DETECTED   Enterobacteriaceae species NOT DETECTED NOT DETECTED   Enterobacter cloacae complex NOT DETECTED NOT DETECTED   Escherichia coli NOT DETECTED NOT DETECTED   Klebsiella oxytoca NOT DETECTED NOT DETECTED   Klebsiella pneumoniae NOT DETECTED NOT DETECTED   Proteus species NOT DETECTED NOT DETECTED   Serratia marcescens NOT DETECTED NOT DETECTED   Haemophilus influenzae NOT DETECTED NOT DETECTED   Neisseria meningitidis NOT DETECTED NOT DETECTED   Pseudomonas aeruginosa NOT DETECTED NOT DETECTED   Candida albicans NOT DETECTED NOT DETECTED   Candida glabrata NOT DETECTED NOT DETECTED   Candida krusei NOT DETECTED NOT DETECTED   Candida parapsilosis NOT DETECTED NOT DETECTED   Candida  tropicalis NOT DETECTED NOT DETECTED    Lenis Noon, PharmD 08/29/2018  3:03 PM

## 2018-08-29 NOTE — Telephone Encounter (Signed)
No los per 6/29. °

## 2018-08-29 NOTE — Progress Notes (Signed)
Danielle [name]   DOB:06/18/1945   HT#:[id number]   XBW#:[id number]  Oncology follow up  Subjective: Pt is well-known to me, under my for her breast cancer.  She was admitted left breast and chest wall   Cellulitis. She is feeling some better today, afebrile, still has moderate left breast pain.    Objective:  Vitals:   08/29/18 0818 08/29/18 1523  BP: 117/76 133/72  Pulse: 85 78  Resp: 20 20  Temp: 98.6 F (37 C) 98.7 F (37.1 C)  SpO2: 100% 99%    Body mass index is 39.81 kg/m.  Intake/Output Summary (Last 24 hours) at 08/29/2018 1712 Last data filed at 08/29/2018 1400 Gross per 24 hour  Intake 2181.31 ml  Output -  Net 2181.31 ml     Sclerae unicteric  Oropharynx clear  No peripheral adenopathy  Lungs clear -- no rales or rhonchi  Heart regular rate and rhythm  Abdomen benign  Neuro nonfocal  Breast exam: Diffuse left breast erythema, mild lymphedema with tenderness, with associated left chest wall and upper abdomen wall skin hyperpigmentation   CBG (last 3)  No results for input(s): GLUCAP in the last 72 hours.   Labs:  Urine Studies No results for input(s): UHGB, CRYS in the last 72 hours.  Invalid input(s): UACOL, UAPR, USPG, UPH, UTP, UGL, UKET, UBIL, UNIT, UROB, ULEU, UEPI, UWBC, URBC, UBAC, CAST, UCOM, BILUA  Basic Metabolic Panel: Recent Labs  Lab 08/28/18 1725 08/29/18 0442  NA 140 140  K 3.9 3.7  CL 111 112*  CO2 21* 21*  GLUCOSE 90 92  BUN 13 15  CREATININE 1.04* 1.18*  CALCIUM 9.3 8.4*  MG 2.1  --    GFR Estimated Creatinine Clearance: 56.5 mL/min (A) (by C-G formula based on SCr of 1.18 mg/dL (H)). Liver Function Tests: Recent Labs  Lab 08/29/18 0442  AST 10*  ALT 10  ALKPHOS 36*  BILITOT 0.7  PROT 5.8*  ALBUMIN 3.0*   No results for input(s): LIPASE, AMYLASE in the last 168 hours. No results for input(s): AMMONIA in the last 168 hours. Coagulation profile Recent Labs  Lab 08/28/18 1908 08/29/18 0442  INR 1.9*  2.0*    CBC: Recent Labs  Lab 08/28/18 1725 08/29/18 0442  WBC 10.9* 8.4  NEUTROABS 9.0*  --   HGB 12.2 10.7*  HCT 40.3 34.1*  MCV 99.8 98.6  PLT 82* 73*   Cardiac Enzymes: No results for input(s): CKTOTAL, CKMB, CKMBINDEX, TROPONINI in the last 168 hours. BNP: Invalid input(s): POCBNP CBG: No results for input(s): GLUCAP in the last 168 hours. D-Dimer No results for input(s): DDIMER in the last 72 hours. Hgb A1c Recent Labs    08/28/18 1725  HGBA1C 5.0   Lipid Profile No results for input(s): CHOL, HDL, LDLCALC, TRIG, CHOLHDL, LDLDIRECT in the last 72 hours. Thyroid function studies No results for input(s): TSH, T4TOTAL, T3FREE, THYROIDAB in the last 72 hours.  Invalid input(s): FREET3 Anemia work up No results for input(s): VITAMINB12, FOLATE, FERRITIN, TIBC, IRON, RETICCTPCT in the last 72 hours. Microbiology Recent Results (from the past 240 hour(s))  Blood Cultures x 2 sites     Status: None (Preliminary result)   Collection Time: 08/28/18  5:25 PM   Specimen: BLOOD RIGHT HAND  Result Value Ref Range Status   Specimen Description   Final    BLOOD RIGHT HAND Performed at [name], [place] [address], [place], [place] [address]    Special Requests   Final  BOTTLES DRAWN AEROBIC ONLY Blood Culture adequate volume Performed at [facility] [address], [place], [place] [address]    Culture   Final    NO GROWTH < 24 HOURS Performed at [facility] [address] [place], [place] [address]    Report Status PENDING  Incomplete  Blood Cultures x 2 sites     Status: None (Preliminary result)   Collection Time: 08/28/18  5:25 PM   Specimen: BLOOD RIGHT HAND  Result Value Ref Range Status   Specimen Description   Final    BLOOD RIGHT HAND Performed at [facility] [address], [place], [place] [address]    Special Requests   Final    BOTTLES DRAWN AEROBIC ONLY Blood Culture results  may not be optimal due to an inadequate volume of blood received in culture bottles Performed at [facility] [address] [place], [place] [address]    Culture  Setup Time   Final    GRAM POSITIVE COCCI AEROBIC BOTTLE ONLY Organism ID to follow CRITICAL RESULT CALLED TO, READ BACK BY AND VERIFIED WITH: Shelda Jakes PharmD 14:40 08/29/18 ([email or web address]) Performed at [facility], [address], [place], [place] [address]    Culture GRAM POSITIVE COCCI  Final   Report Status PENDING  Incomplete  Blood Culture ID Panel (Reflexed)     Status: Abnormal   Collection Time: 08/28/18  5:25 PM  Result Value Ref Range Status   Enterococcus species NOT DETECTED NOT DETECTED Final   Listeria monocytogenes NOT DETECTED NOT DETECTED Final   Staphylococcus species DETECTED (A) NOT DETECTED Final    Comment: Methicillin (oxacillin) susceptible coagulase negative staphylococcus. Possible blood culture contaminant (unless isolated from more than one blood culture draw or clinical case suggests pathogenicity). No antibiotic treatment is indicated for blood  culture contaminants. CRITICAL RESULT CALLED TO, READ BACK BY AND VERIFIED WITH: Shelda Jakes PharmD 14:40 08/29/18 ([email or web address])    Staphylococcus aureus (BCID) NOT DETECTED NOT DETECTED Final   Methicillin resistance NOT DETECTED NOT DETECTED Final   Streptococcus species NOT DETECTED NOT DETECTED Final   Streptococcus agalactiae NOT DETECTED NOT DETECTED Final   Streptococcus pneumoniae NOT DETECTED NOT DETECTED Final   Streptococcus pyogenes NOT DETECTED NOT DETECTED Final   Acinetobacter baumannii NOT DETECTED NOT DETECTED Final   Enterobacteriaceae species NOT DETECTED NOT DETECTED Final   Enterobacter cloacae complex NOT DETECTED NOT DETECTED Final   Escherichia coli NOT DETECTED NOT DETECTED Final   Klebsiella oxytoca NOT DETECTED NOT DETECTED Final   Klebsiella pneumoniae NOT DETECTED NOT DETECTED Final   Proteus species NOT  DETECTED NOT DETECTED Final   Serratia marcescens NOT DETECTED NOT DETECTED Final   Haemophilus influenzae NOT DETECTED NOT DETECTED Final   Neisseria meningitidis NOT DETECTED NOT DETECTED Final   Pseudomonas aeruginosa NOT DETECTED NOT DETECTED Final   Candida albicans NOT DETECTED NOT DETECTED Final   Candida glabrata NOT DETECTED NOT DETECTED Final   Candida krusei NOT DETECTED NOT DETECTED Final   Candida parapsilosis NOT DETECTED NOT DETECTED Final   Candida tropicalis NOT DETECTED NOT DETECTED Final    Comment: Performed at [facility], [address] [place], [place] [address]  Novel Coronavirus,NAA,(SEND-OUT TO REF LAB - TAT 24-48 hrs); Hosp Order     Status: None   Collection Time: 08/28/18  5:41 PM   Specimen: Nasopharyngeal Swab; Respiratory  Result Value Ref Range Status   SARS-CoV-2, NAA  NOT DETECTED NOT DETECTED Final    Comment: (NOTE) This test was developed and its performance characteristics determined by [employer]. This test has not been FDA cleared or approved. This test has been authorized by FDA under an Emergency Use Authorization (EUA). This test is only authorized for the duration of time the declaration that circumstances exist justifying the authorization of the emergency use of in vitro diagnostic tests for detection of SARS-CoV-2 virus and/or diagnosis of COVID-19 infection under section 564(b)(1) of the Act, 21 U.S.C. 631SHF-0(Y)(6), unless the authorization is terminated or revoked sooner. When diagnostic testing is negative, the possibility of a false negative result should be considered in the context of a patient's recent exposures and the presence of clinical signs and symptoms consistent with COVID-19. An individual without symptoms of COVID-19 and who is not shedding SARS-CoV-2 virus would expect to have a negative (not detected) result in this assay. Performed  At: [facility] [place], [place]  [id number] Rush Farmer MD DX:[id number]    [place]  Final    Comment: Performed at [facility] [address], [place], [place] [address]      Studies:  Ct Abdomen Wo Contrast  Result Date: 08/28/2018 CLINICAL DATA:  Left upper abdominal tenderness and pain. Cellulitis. Erysipelas. EXAM: CT ABDOMEN WITHOUT CONTRAST TECHNIQUE: Multidetector CT imaging of the abdomen was performed following the standard protocol without IV contrast. COMPARISON:  None. FINDINGS: Lower chest: There is thickening of the skin of the left breast. There is a 4 x 2 cm fluid collection deep in the left breast adjacent to the chest wall with a small metallic marker adjacent to the collection. This probably represents a seroma from previous breast surgery. There is slight thickening of the skin along the left lateral aspect of the upper abdomen without underlying cellulitis or abscess. No infiltrates or effusions. Small emphysematous blebs at both lung bases. Aortic atherosclerosis. Hepatobiliary: Numerous small gallstones. 18 mm low-density lesion in the right lobe of the liver adjacent to the gallbladder consistent with a cyst. 8 mm low-density lesion in the small left lobe of the liver, also probably a cyst. No dilated bile ducts. Pancreas: Unremarkable. No pancreatic ductal dilatation or surrounding inflammatory changes. Spleen: Normal in size without focal abnormality. Adrenals/Urinary Tract: Normal adrenal glands. Small exophytic cysts in the left kidney. No hydronephrosis. Stomach/Bowel: Small hiatal hernia. Scattered diverticula in the splenic flexure of the colon. Visualized small bowel is normal. Vascular/Lymphatic: Aortic atherosclerosis. No enlarged abdominal lymph nodes. Slightly dilated left common iliac artery to 17 mm. Other: No ascites, free air, or abdominal wall hernia. Musculoskeletal: Multilevel degenerative disc disease in the lumbar spine. No acute bony  abnormality. IMPRESSION: 1. Skin thickening of the left breast and more subtle skin thickening adjacent to the left upper quadrant of the abdomen with no underlying cellulitis or abscess. 2. Small fluid collection deep within the left breast is probably a seroma from previous breast surgery. 3. Cholelithiasis. 4.  Aortic Atherosclerosis (ICD10-I70.0). 5. 17 mm dilatation of the left common iliac artery. Electronically Signed   By: Lorriane Shire M.D.   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 CT exam performed 08/28/2018 shows a 4 x 2 centimeter fluid collection deep in the LEFT breast adjacent to the chest wall. Skin thickening of the LEFT breast was also noted on  CT exam. Patient has diffuse erythema and pain in the LEFT breast. EXAM: ULTRASOUND OF THE LEFT BREAST COMPARISON:  CT exam on 08/28/2018 and multiple previous breast imaging studies, including diagnostic 12/15/2017. FINDINGS: Targeted ultrasound is performed, showing an near anechoic irregular mass with irregular margins in the 6 o'clock location of the LEFT breast, posterior in location which measures 2.2 x 4.2 x 1.5 centimeters. There is no associated internal blood flow. There is skin and trabecular thickening of the LEFT breast. IMPRESSION: Fluid collection deep within the 6 o'clock location of the LEFT breast is favored to represent seroma cavity. Although there is skin and trabecular thickening in the central portion of the breast, no discrete fluid collections are identified to indicate presence of abscess. The skin and trabecular thickening could be related to post treatment changes or mastitis. RECOMMENDATION: Appropriate treatment if needed for possible mastitis. Recommend follow-up LEFT mammogram and ultrasound at a Breast Imaging facility. I have discussed the findings and recommendations with the patient.  Results were also provided in writing at the conclusion of the visit. If applicable, a reminder letter will be sent to the patient regarding the next appointment. Electronically Signed   By: Nolon Nations M.D.   On: 08/29/2018 11:24    Assessment: 73 y.o. with PMH of stage I left breast cancer, s/p lumpectomy and radiation, currently on tamoxifen, admitted for recurrent left breast cellulitis  1. 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 History of stage I left breast cancer  3. CAD, AF, CHF, stable 4. 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 CKD stage III   Plan:  -appreciate ID Dr. Hale Bogus input, antibiotics was changed from Unasyn to vancomycin, due to the concern of MRSA -I reviewed her breast and axillary [place] and CT abdomen results, she has a 4.2cm seroma in left breast, will get surgical input to rule out abscess or any surgical intervention needed  -I will f/u as needed, appreciate the hospitalist's care    [name], MD 08/29/2018  5:12 PM

## 2018-08-29 NOTE — Progress Notes (Signed)
[facility] Surgery Consult Note  Danielle [name] 06-18-1945  [id number].    Requesting MD: Irine Seal Chief Complaint: chest wall cellulitis and possible breast abscess Reason for Consult: Breast abscess  HPI: Patient is a 73 year old female history of left breast cancer.  She is currently on tamoxifen and followed by Dr. [name].  She is status post left breast lumpectomy with radioactive seed sentinel lymph node biopsy 01/14/2017 by Dr. Rolm Bookbinder.  Patient presented in the cancer clinic on 08/08/2018 with development of whole left breast redness and warmth.  The breast was slightly swollen to baseline from radiation.  She continued her tamoxifen therapy.  She denied any fever chills pain or nipple discharge.  No new lumps.  She denied fall or injury or insect bites.  In addition to cellulitis of breast she had some chest wall erythema also.  Was treated with doxycycline 100 mg twice daily x7 days.  She was seen again on 08/10/2018 and her symptoms had improved on the doxycycline.  Telephone follow-up on 6/25 she reported ongoing improvement.  She was seen again on 08/28/2018 with 24 hours of recurrent breast tenderness, erythema,and warmth. He was sent to the ED and seen and admitted by Medicine with redness, warmth, left abdomen and breast.  Workup shows:  Tm 100.2, VSS.  Creatinine 1.04, prealbumin 21.2, CRP 2.0 WBC 10.9, H/H 12.2/40.3, platelets 82K.   INR 1.9-2.0, COVID in process.  CT scan shows thickening of skin left breast, 4 x 2 cm fluid collection deep breast that they think may be a seroma.  No underlying cellulites or abscess.  Cholelithiasis, aortic atheroscelerosis, 17 mm left common iliac.  Ultrasound shows:Fluid collection deep within the 6 o'clock location of the LEFT breast is favored to represent seroma cavity. Although there is skin and trabecular thickening in the central portion of the breast, no discrete fluid collections are identified to  indicate presence of abscess. The skin and trabecular thickening could be related to post treatment changes or mastitis.  Additional medical problems include aortic stenosis status post bioprosthetic aVR.  Breast cancer, asthma, COPD, CAD, hypertension, hypothyroidism.    ROS: Review of Systems  Constitutional: Positive for fever (she had a low grade fever here but none at home. she was aware of). Negative for chills, diaphoresis, malaise/fatigue and weight loss.  HENT: Negative.   Eyes: Negative.   Respiratory: Negative.   Cardiovascular: Positive for leg swelling. Negative for chest pain, palpitations, orthopnea, claudication and PND.  Gastrointestinal: Negative.   Genitourinary: Negative.   Musculoskeletal: Negative.   Skin:       Redness and heat to left breast and left side.  She say breast feels heavy and hot.  Neurological: Negative.   Endo/Heme/Allergies: Bruises/bleeds easily.  Psychiatric/Behavioral: Negative.       Family History  Problem Relation Age of Onset   Diabetes Mother    Heart attack Mother 68   Diabetes Father    Lung cancer Father    Diabetes Sister    Thyroid disease Sister    Diabetes Sister    HIV Brother     Past Medical History:  Diagnosis Date   Allergy    Anxiety    Aortic stenosis    Status post bioprosthetic AVR   Arthritis    DJD   Asthma    Breast cancer ([place]) 12/13/2016   Left breast   Chronic systolic CHF (congestive heart failure) (HCC)    EF 30% 04/2014   COPD (chronic obstructive pulmonary disease) (  [place])    Coronary artery disease    per pt, had LHC prior to AVR in [place] that did not show any blockages; no stents/bypass   Gastritis    Hyperlipidemia    Hypertension    Hyperthyroidism    Multiple thyroid nodules    Osteoporosis    Paroxysmal atrial fibrillation ([place])    Pelvis fracture ([place]) 08/19/2015   MULTIPLE    Personal history of radiation therapy 2018   Spontaneous  pneumothorax 11/28/2015   left    Stroke ([place]) 2000   rt hand weak    Past Surgical History:  Procedure Laterality Date   BREAST LUMPECTOMY Left 12/2016   BREAST LUMPECTOMY WITH RADIOACTIVE SEED AND SENTINEL LYMPH NODE BIOPSY Left 01/14/2017   Procedure: LEFT BREAST LUMPECTOMY WITH RADIOACTIVE SEED AND SENTINEL LYMPH NODE BIOPSY;  Surgeon: Rolm Bookbinder, MD;  Location: [place];  Service: General;  Laterality: Left;   CHEST TUBE INSERTION  10/2015   INTRAMEDULLARY (IM) NAIL INTERTROCHANTERIC Left 12/10/2015   Procedure: INTRAMEDULLARY (IM) NAIL INTERTROCHANTRIC;  Surgeon: Leandrew Koyanagi, MD;  Location: [place];  Service: Orthopedics;  Laterality: Left;   PLEURADESIS Left 12/03/2015   Procedure: PLEURADESIS;  Surgeon: Melrose Nakayama, MD;  Location: [place];  Service: Thoracic;  Laterality: Left;   RESECTION OF APICAL BLEB Left 12/03/2015   Procedure: BLEBECTOMY;  Surgeon: Melrose Nakayama, MD;  Location: [place];  Service: Thoracic;  Laterality: Left;   THORACOSCOPY  12/03/2015   VALVE REPLACEMENT     VIDEO ASSISTED THORACOSCOPY Left 12/03/2015   Procedure: VIDEO ASSISTED THORACOSCOPY;  Surgeon: Melrose Nakayama, MD;  Location: [place];  Service: Thoracic;  Laterality: Left;    Social History:  reports that she quit smoking about 8 years ago. Her smoking use included cigarettes. She has a 2.50 pack-year smoking history. She has never used smokeless tobacco. She reports that she does not drink alcohol or use drugs.  Allergies:  Allergies  Allergen Reactions   Lisinopril Cough   Tetanus Toxoid Adsorbed Swelling    Arm swelling    Medications Prior to Admission  Medication Sig Dispense Refill   acetaminophen (TYLENOL) 500 MG tablet Take 1,000 mg by mouth daily as needed (PAIN).     albuterol (PROVENTIL HFA;VENTOLIN HFA) 108 (90 Base) MCG/ACT inhaler Inhale 2 puffs into the lungs every 6 (six) hours as needed for wheezing or shortness of breath.      aspirin EC 81  MG tablet Take 81 mg daily by mouth.     budesonide-formoterol (SYMBICORT) 160-4.5 MCG/ACT inhaler Inhale 2 puffs into the lungs 2 (two) times daily. 1 Inhaler 0   carvedilol (COREG) 25 MG tablet Take 1 tablet by mouth twice daily 180 tablet 1   Cholecalciferol (VITAMIN D3) 400 units tablet Take 1 tablet (400 Units total) by mouth daily. 30 tablet 0   diclofenac sodium (VOLTAREN) 1 % GEL Apply 4 g topically 4 (four) times daily. 500 g 3   diphenhydramine-acetaminophen (TYLENOL PM) 25-500 MG TABS tablet Take 2 tablets by mouth at bedtime.      furosemide (LASIX) 20 MG tablet Take 1 tablet (20 mg total) by mouth daily. 90 tablet 2   losartan (COZAAR) 50 MG tablet Take 1 tablet by mouth once daily 90 tablet 1   methimazole (TAPAZOLE) 5 MG tablet Take 3 tablets (15 mg total) by mouth every evening. 90 tablet 2   rosuvastatin (CRESTOR) 40 MG tablet Take 1 tablet (40 mg total) by mouth daily.  90 tablet 3   senna-docusate (SENOKOT-S) 8.6-50 MG tablet Take 2 tablets at bedtime by mouth.      tamoxifen (NOLVADEX) 20 MG tablet Take 1 tablet by mouth once daily 90 tablet 0   traMADol (ULTRAM) 50 MG tablet Take 1 tablet (50 mg total) by mouth 3 (three) times daily. 90 tablet 5   warfarin (COUMADIN) 3 MG tablet TAKE AS DIRECTED BY  ANTICOAGULATION  CLINIC (Patient taking differently: Take 3 mg by mouth as directed. Take 1 tablet (3 mg) daily except on Wednesday. TAKE AS DIRECTED BY  ANTICOAGULATION  CLINIC) 90 tablet 1   alendronate (FOSAMAX) 70 MG tablet TAKE  1 TABLET BY MOUTH EVERY SATURDAY WITH A FULL GLASS OF WATER ON A EMPTY STOMACH (Patient taking differently: Take 70 mg by mouth once a week. ) 4 tablet 0   valACYclovir (VALTREX) 500 MG tablet Take 1 tablet (500 mg total) by mouth 2 (two) times daily. For 3 days when outbreaks. (Patient taking differently: Take 500 mg by mouth 2 (two) times daily as needed (outbreaks). For 3 days when outbreaks.) 24 tablet 1   warfarin (COUMADIN) 1 MG  tablet 1 tab wednesdays (Patient taking differently: Take 1 mg by mouth once a week. Take 1 tablet (1 mg) Only on Wednesday) 20 tablet 2    Blood pressure 117/76, pulse 85, temperature 98.6 F (37 C), temperature source Oral, resp. rate 20, height 5\' 7"  (1.702 m), weight 115.3 kg, SpO2 100 %. Physical Exam: Physical Exam Constitutional:      General: She is not in acute distress.    Appearance: Normal appearance. She is obese. She is not ill-appearing, toxic-appearing or diaphoretic.  HENT:     Head: Normocephalic and atraumatic.     Nose: Nose normal.     Mouth/Throat:     Comments: Mask in place, COVID pending Eyes:     General: No scleral icterus.    Conjunctiva/sclera: Conjunctivae normal.     Comments: Pupils are equal  Neck:     Musculoskeletal: Normal range of motion and neck supple. No neck rigidity or muscular tenderness.     Vascular: No carotid bruit.  Cardiovascular:     Rate and Rhythm: Normal rate and regular rhythm.     Pulses: Normal pulses.     Comments: Median sternotomy scar from previous AVR with bioprosthesis Pulmonary:     Effort: Pulmonary effort is normal. No respiratory distress.     Breath sounds: Normal breath sounds. No stridor. No wheezing, rhonchi or rales.  Chest:     Chest wall: No tenderness.  Abdominal:     General: There is no distension.     Palpations: Abdomen is soft.     Tenderness: There is no abdominal tenderness. There is no right CVA tenderness, left CVA tenderness, guarding or rebound.     Hernia: No hernia is present.  Musculoskeletal: Normal range of motion.  Lymphadenopathy:     Cervical: No cervical adenopathy.  Skin:    General: Skin is warm and dry.     Capillary Refill: Capillary refill takes less than 2 seconds.     Findings: Erythema present.     Comments: Left breast is has a diffuse cellulitis, it is red and warm, it feels heavy.  She has the same redness and warmth on there left chest wall and some of her lower lateral  left abdominal wall.  I could not find any other sites on her breast, under her breast, lateral chest wall  or abdomen that has any fluctuance.    I further examined her back, both sides, abdomen, and right chest wall.  No other sites found.     Neurological:     General: No focal deficit present.     Mental Status: She is alert and oriented to person, place, and time.     Cranial Nerves: No cranial nerve deficit.  Psychiatric:        Mood and Affect: Mood normal.        Behavior: Behavior normal.        Thought Content: Thought content normal.        Judgment: Judgment normal.     Results for orders placed or performed during the hospital encounter of 08/28/18 (from the past 48 hour(s))  Basic metabolic panel     Status: Abnormal   Collection Time: 08/28/18  5:25 PM  Result Value Ref Range   Sodium 140 135 - 145 mmol/L   Potassium 3.9 3.5 - 5.1 mmol/L   Chloride 111 98 - 111 mmol/L   CO2 21 (L) 22 - 32 mmol/L   Glucose, Bld 90 70 - 99 mg/dL   BUN 13 8 - 23 mg/dL   Creatinine, Ser 1.04 (H) 0.44 - 1.00 mg/dL   Calcium 9.3 8.9 - 10.3 mg/dL   GFR calc non Af Amer 54 (L) >60 mL/min   GFR calc Af Amer >60 >60 mL/min   Anion gap 8 5 - 15    Comment: Performed at [facility], [place] [address] [place], [place] [address]  CBC with Differential     Status: Abnormal   Collection Time: 08/28/18  5:25 PM  Result Value Ref Range   WBC 10.9 (H) 4.0 - 10.5 K/uL   RBC 4.04 3.87 - 5.11 MIL/uL   Hemoglobin 12.2 12.0 - 15.0 g/dL   HCT 40.3 36.0 - 46.0 %   MCV 99.8 80.0 - 100.0 fL   MCH 30.2 26.0 - 34.0 pg   MCHC 30.3 30.0 - 36.0 g/dL   RDW 14.6 11.5 - 15.5 %   Platelets 82 (L) 150 - 400 K/uL    Comment: Immature Platelet Fraction may be clinically indicated, consider ordering this additional test [id number]    nRBC 0.0 0.0 - 0.2 %   Neutrophils Relative % 83 %   Neutro Abs 9.0 (H) 1.7 - 7.7 K/uL   Lymphocytes Relative 11 %   Lymphs Abs 1.2 0.7 - 4.0 K/uL   Monocytes  Relative 5 %   Monocytes Absolute 0.6 0.1 - 1.0 K/uL   Eosinophils Relative 1 %   Eosinophils Absolute 0.1 0.0 - 0.5 K/uL   Basophils Relative 0 %   Basophils Absolute 0.0 0.0 - 0.1 K/uL   Immature Granulocytes 0 %   Abs Immature Granulocytes 0.03 0.00 - 0.07 K/uL    Comment: Performed at [facility], [place] [address] [place], [place] [address]  Hemoglobin A1c     Status: None   Collection Time: 08/28/18  5:25 PM  Result Value Ref Range   Hgb A1c MFr Bld 5.0 4.8 - 5.6 %    Comment: (NOTE) Pre diabetes:          5.7%-6.4% Diabetes:              >6.4% Glycemic control for   <7.0% adults with diabetes    Mean Plasma Glucose 96.8 mg/dL    Comment: Performed at [place],  [place] [address]  Sedimentation rate     Status: None   Collection Time: 08/28/18  5:25 PM  Result Value Ref Range   Sed Rate 13 0 - 22 mm/hr    Comment: Performed at [facility], [place] [address], [place], [place] [address]  C-reactive protein     Status: Abnormal   Collection Time: 08/28/18  5:25 PM  Result Value Ref Range   CRP 2.0 (H) <1.0 mg/dL    Comment: Performed at [facility], [place] [address] [place], [place] [address]  Prealbumin     Status: None   Collection Time: 08/28/18  5:25 PM  Result Value Ref Range   Prealbumin 21.2 18 - 38 mg/dL    Comment: Performed at [facility], [place] [address] [place], [place] [address]  Blood Cultures x 2 sites     Status: None (Preliminary result)   Collection Time: 08/28/18  5:25 PM   Specimen: BLOOD RIGHT HAND  Result Value Ref Range   Specimen Description      BLOOD RIGHT HAND Performed at [place] [address] [place], [place] [address]    Special Requests      BOTTLES DRAWN AEROBIC ONLY Blood Culture adequate volume Performed at [place] [address] [place], [place] [address]    Culture       NO GROWTH < 12 HOURS Performed at [place] [address], [place], [place] [address]    Report Status PENDING   Blood Cultures x 2 sites     Status: None (Preliminary result)   Collection Time: 08/28/18  5:25 PM   Specimen: BLOOD RIGHT HAND  Result Value Ref Range   Specimen Description      BLOOD RIGHT HAND Performed at [place] [address], [place], [place] [address]    Special Requests      BOTTLES DRAWN AEROBIC ONLY Blood Culture results may not be optimal due to an inadequate volume of blood received in culture bottles Performed at [place] [address], [place], [place] [address]    Culture      NO GROWTH < 12 HOURS Performed at [place] [address], [place], [place] [address]    Report Status PENDING   Magnesium     Status: None   Collection Time: 08/28/18  5:25 PM  Result Value Ref Range   Magnesium 2.1 1.7 - 2.4 mg/dL    Comment: Performed at [facility], [place] [address], [place], [place] [address]  Protime-INR     Status: Abnormal   Collection Time: 08/28/18  7:08 PM  Result Value Ref Range   Prothrombin Time 21.3 (H) 11.4 - 15.2 seconds   INR 1.9 (H) 0.8 - 1.2    Comment: (NOTE) INR goal varies based on device and disease states. Performed at [facility], [place] [address] [place],  [address]   Comprehensive metabolic panel     Status: Abnormal   Collection Time: 08/29/18  4:42 AM  Result Value Ref Range   Sodium 140 135 - 145 mmol/L   Potassium 3.7 3.5 - 5.1 mmol/L   Chloride 112 (H) 98 - 111 mmol/L   CO2 21 (L) 22 - 32 mmol/L   Glucose, Bld 92 70 - 99 mg/dL   BUN 15 8 - 23 mg/dL   Creatinine, Ser 1.18 (H) 0.44 - 1.00 mg/dL  Calcium 8.4 (L) 8.9 - 10.3 mg/dL   Total Protein 5.8 (L) 6.5 - 8.1 g/dL   Albumin 3.0 (L) 3.5 - 5.0 g/dL   AST 10 (L) 15 - 41 U/L   ALT 10 0 - 44 U/L   Alkaline Phosphatase 36 (L) 38 - 126 U/L   Total Bilirubin  0.7 0.3 - 1.2 mg/dL   GFR calc non Af Amer 46 (L) >60 mL/min   GFR calc Af Amer 53 (L) >60 mL/min   Anion gap 7 5 - 15    Comment: Performed at [facility], [place] [address], [place], [place] [address]  CBC     Status: Abnormal   Collection Time: 08/29/18  4:42 AM  Result Value Ref Range   WBC 8.4 4.0 - 10.5 K/uL   RBC 3.46 (L) 3.87 - 5.11 MIL/uL   Hemoglobin 10.7 (L) 12.0 - 15.0 g/dL   HCT 34.1 (L) 36.0 - 46.0 %   MCV 98.6 80.0 - 100.0 fL   MCH 30.9 26.0 - 34.0 pg   MCHC 31.4 30.0 - 36.0 g/dL   RDW 14.8 11.5 - 15.5 %   Platelets 73 (L) 150 - 400 K/uL    Comment: Immature Platelet Fraction may be clinically indicated, consider ordering this additional test [id number] CONSISTENT WITH PREVIOUS RESULT    nRBC 0.0 0.0 - 0.2 %    Comment: Performed at [facility], [place] [address] [place], [place] [address]  Protime-INR     Status: Abnormal   Collection Time: 08/29/18  4:42 AM  Result Value Ref Range   Prothrombin Time 22.0 (H) 11.4 - 15.2 seconds   INR 2.0 (H) 0.8 - 1.2    Comment: (NOTE) INR goal varies based on device and disease states. Performed at [facility], [place] [address], [place], [place] [address]    Ct Abdomen Wo Contrast  Result Date: 08/28/2018 CLINICAL DATA:  Left upper abdominal tenderness and pain. Cellulitis. Erysipelas. EXAM: CT ABDOMEN WITHOUT CONTRAST TECHNIQUE: Multidetector CT imaging of the abdomen was performed following the standard protocol without IV contrast. COMPARISON:  None. FINDINGS: Lower chest: There is thickening of the skin of the left breast. There is a 4 x 2 cm fluid collection deep in the left breast adjacent to the chest wall with a small metallic marker adjacent to the collection. This probably represents a seroma from previous breast surgery. There is slight thickening of the skin along the left lateral aspect of the upper abdomen without underlying cellulitis or abscess. No infiltrates  or effusions. Small emphysematous blebs at both lung bases. Aortic atherosclerosis. Hepatobiliary: Numerous small gallstones. 18 mm low-density lesion in the right lobe of the liver adjacent to the gallbladder consistent with a cyst. 8 mm low-density lesion in the small left lobe of the liver, also probably a cyst. No dilated bile ducts. Pancreas: Unremarkable. No pancreatic ductal dilatation or surrounding inflammatory changes. Spleen: Normal in size without focal abnormality. Adrenals/Urinary Tract: Normal adrenal glands. Small exophytic cysts in the left kidney. No hydronephrosis. Stomach/Bowel: Small hiatal hernia. Scattered diverticula in the splenic flexure of the colon. Visualized small bowel is normal. Vascular/Lymphatic: Aortic atherosclerosis. No enlarged abdominal lymph nodes. Slightly dilated left common iliac artery to 17 mm. Other: No ascites, free air, or abdominal wall hernia. Musculoskeletal: Multilevel degenerative disc disease in the lumbar spine. No acute bony abnormality. IMPRESSION: 1. Skin thickening of the left breast and more subtle skin thickening adjacent to the left upper quadrant  of the abdomen with no underlying cellulitis or abscess. 2. Small fluid collection deep within the left breast is probably a seroma from previous breast surgery. 3. Cholelithiasis. 4.  Aortic Atherosclerosis (ICD10-I70.0). 5. 17 mm dilatation of the left common iliac artery. Electronically Signed   By: Lorriane Shire M.D.   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 CT exam performed 08/28/2018 shows a 4 x 2 centimeter fluid collection deep in the LEFT breast adjacent to the chest wall. Skin thickening of the LEFT breast was also noted on CT exam. Patient has diffuse erythema and pain in the LEFT breast. EXAM: ULTRASOUND OF THE LEFT BREAST COMPARISON:  CT exam on 08/28/2018 and  multiple previous breast imaging studies, including diagnostic 12/15/2017. FINDINGS: Targeted ultrasound is performed, showing an near anechoic irregular mass with irregular margins in the 6 o'clock location of the LEFT breast, posterior in location which measures 2.2 x 4.2 x 1.5 centimeters. There is no associated internal blood flow. There is skin and trabecular thickening of the LEFT breast. IMPRESSION: Fluid collection deep within the 6 o'clock location of the LEFT breast is favored to represent seroma cavity. Although there is skin and trabecular thickening in the central portion of the breast, no discrete fluid collections are identified to indicate presence of abscess. The skin and trabecular thickening could be related to post treatment changes or mastitis. RECOMMENDATION: Appropriate treatment if needed for possible mastitis. Recommend follow-up LEFT mammogram and ultrasound at a Breast Imaging facility. I have discussed the findings and recommendations with the patient. Results were also provided in writing at the conclusion of the visit. If applicable, a reminder letter will be sent to the patient regarding the next appointment. Electronically Signed   By: Nolon Nations M.D.   On: 08/29/2018 11:24    sodium chloride 75 mL/hr at 08/29/18 1010   vancomycin     [START ON 08/30/2018] vancomycin     Anti-infectives (From admission, onward)   Start     Dose/Rate Route Frequency Ordered Stop   08/30/18 1400  vancomycin (VANCOCIN) IVPB 1000 mg/200 mL premix     1,000 mg 200 mL/hr over 60 Minutes Intravenous Every 24 hours 08/29/18 1456     08/29/18 1500  vancomycin (VANCOCIN) 2,000 mg in sodium chloride 0.9 % 500 mL IVPB     2,000 mg 250 mL/hr over 120 Minutes Intravenous  Once 08/29/18 1450     08/28/18 2100  valACYclovir (VALTREX) tablet 500 mg    Note to Pharmacy: For 3 days when outbreaks.     500 mg Oral 2 times daily 08/28/18 1614     08/28/18 2000  Ampicillin-Sulbactam (UNASYN) 3 g in  sodium chloride 0.9 % 100 mL IVPB  Status:  Discontinued     3 g 200 mL/hr over 30 Minutes Intravenous Every 6 hours 08/28/18 1903 08/29/18 1447   08/28/18 1645  Ampicillin-Sulbactam (UNASYN) 3 g in sodium chloride 0.9 % 100 mL IVPB  Status:  Discontinued     3 g 200 mL/hr over 30 Minutes Intravenous Every 6 hours 08/28/18 1641 08/28/18 1903       Assessment/Plan S/p aortic valve replacement with prosthetic valve. CAD COPD Hypertension Hypothyroid Obesity BMI 39.57  Recurrent left breast, chest wall and abdominal wall cellulitis, with 4 x 2 cm fluid collection deep left breast. Left breast cancer s/p left breast lumpectomy  with radioactive seed sentinel lymph node biopsy 01/14/2017, Dr. Rolm Bookbinder Hx of ongoing chemotherapy, and Hx of radiation therapy for her left breast cancer  FEN: IV fluids/healthy diet ID: Unasyn 6/29 >>day 2; vancomycin 6/30 >>day 1 DVT: Coumadin INR 2.0 POC:   McEachean,Sherwood Spouse [phone]     Plan: The CT and the ultrasound did not show anything that looks like an abscess.  The redness, heaviness, and warmth, of her left breast and left abdominal wall and chest wall is quite impressive.  Will discuss with Dr. Barry Dienes.  [facility] Surgery Pager:  [phone]      08/29/2018 12:55 PM

## 2018-08-29 NOTE — Progress Notes (Signed)
Pt and NT reported that pt had small amount of blood smear on tissue after having BM.  Dr. Grandville Silos made aware. Will continue to monitor. [name]

## 2018-08-29 NOTE — Consult Note (Addendum)
[facility] for Infectious Disease    Date of Admission:  08/28/2018           Day 1 ampicillin sulbactam       Reason for Consult: Recurrent left breast cellulitis    Referring Provider: Dr. Irine Seal  Assessment: She has recurrent left breast cellulitis.  This is likely due to strep or staph bacteria.  Given that she had prompt improvement on doxycycline recently I am more concerned about the possibility of MRSA.  I will change ampicillin sulbactam to vancomycin.  Although both the CT report and ultrasound report referred to the fluid collection has probable seroma, I would recommend reviewing these findings with general surgery to make certain that there is no reason to consider deep abscess.  Unfortunately her admission COVID-19 test was sent to [facility] which means that the turnaround time will be around 48 hours.  Plan: 1. Change ampicillin sulbactam to vancomycin 2. Recommend reviewing radiographic findings with general surgery  Active Problems:   Cellulitis of left breast   Cellulitis of left abdominal wall   Malignant neoplasm of upper-outer quadrant of left breast in female, estrogen receptor positive ([place])   Essential hypertension   Asthma   Paroxysmal atrial fibrillation (HCC)   Hyperthyroidism   CKD (chronic kidney disease), stage III (HCC)   Scheduled Meds: . aspirin EC  81 mg Oral Daily  . carvedilol  25 mg Oral BID  . furosemide  20 mg Oral Daily  . gabapentin  600 mg Oral QHS  . losartan  50 mg Oral Daily  . methimazole  15 mg Oral QPM  . mometasone-formoterol  2 puff Inhalation BID  . rosuvastatin  40 mg Oral Daily  . senna-docusate  2 tablet Oral QHS  . tamoxifen  20 mg Oral Daily  . traMADol  50 mg Oral TID  . valACYclovir  500 mg Oral BID  . [START ON 08/30/2018] warfarin  1.5 mg Oral Once per day on Wed  . warfarin  3 mg Oral Once per day on Sun Mon Tue Thu Fri Sat  . Warfarin - Pharmacist Dosing Inpatient   Does not apply q1800    Continuous Infusions: . sodium chloride 75 mL/hr at 08/29/18 1010  . ampicillin-sulbactam (UNASYN) IV 3 g (08/29/18 1011)   PRN Meds:.acetaminophen **OR** acetaminophen, albuterol, diphenhydrAMINE, HYDROcodone-acetaminophen, ondansetron **OR** ondansetron (ZOFRAN) IV  HPI: Danielle [name] is a 73 y.o. female who underwent lumpectomy and radiation therapy in late 2018 for left breast cancer.  She has had some thickening of the skin of her left breast ever since completing radiation therapy.  She developed sudden redness, warmth and discomfort of her left breast on 08/07/2018.  She was seen the following day at the cancer center and started on doxycycline.  Her cellulitis resolved completely after 7 days of treatment.  Yesterday she developed a sudden recurrence leading to admission.  She was started on empiric ampicillin sulbactam.  CT scan revealed:  IMPRESSION: 1. Skin thickening of the left breast and more subtle skin thickening adjacent to the left upper quadrant of the abdomen with no underlying cellulitis or abscess. 2. Small fluid collection deep within the left breast is probably a seroma from previous breast surgery.  Ultrasound revealed:  IMPRESSION: Fluid collection deep within the 6 o'clock location of the LEFT breast is favored to represent seroma cavity. Although there is skin and trabecular thickening in the central portion of the breast, no discrete  fluid collections are identified to indicate presence of abscess. The skin and trabecular thickening could be related to post treatment changes or mastitis.  She has not noted any improvement in her symptoms since admission last night.  She remains afebrile and blood cultures are negative so far.  Review of Systems: Review of Systems  Constitutional: Negative for chills, diaphoresis and fever.    Past Medical History:  Diagnosis Date  . Allergy   . Anxiety   . Aortic stenosis    Status post bioprosthetic  AVR  . Arthritis    DJD  . Asthma   . Breast cancer ([place]) 12/13/2016   Left breast  . Chronic systolic CHF (congestive heart failure) ([place])    EF 30% 04/2014  . COPD (chronic obstructive pulmonary disease) ([place])   . Coronary artery disease    per pt, had LHC prior to AVR in [place] that did not show any blockages; no stents/bypass  . Gastritis   . Hyperlipidemia   . Hypertension   . Hyperthyroidism   . Multiple thyroid nodules   . Osteoporosis   . Paroxysmal atrial fibrillation (HCC)   . Pelvis fracture ([place]) 08/19/2015   MULTIPLE   . Personal history of radiation therapy 2018  . Spontaneous pneumothorax 11/28/2015   left   . Stroke [facility]) 2000   rt hand weak    Social History   Tobacco Use  . Smoking status: Former Smoker    Packs/day: 0.25    Years: 10.00    Pack years: 2.50    Types: Cigarettes    Quit date: 03/01/2010    Years since quitting: 8.5  . Smokeless tobacco: Never Used  Substance Use Topics  . Alcohol use: No  . Drug use: No    Family History  Problem Relation Age of Onset  . Diabetes Mother   . Heart attack Mother 20  . Diabetes Father   . Lung cancer Father   . Diabetes Sister   . Thyroid disease Sister   . Diabetes Sister   . HIV Brother    Allergies  Allergen Reactions  . Lisinopril Cough  . Tetanus Toxoid Adsorbed Swelling    Arm swelling    OBJECTIVE: Blood pressure 117/76, pulse 85, temperature 98.6 F (37 C), temperature source Oral, resp. rate 20, height 5\' 7"  (1.702 m), weight 115.3 kg, SpO2 100 %.  Physical Exam Constitutional:      Comments: She is resting quietly in bed.  Chest:    Psychiatric:        Mood and Affect: Mood normal.     Lab Results Lab Results  Component Value Date   WBC 8.4 08/29/2018   HGB 10.7 (L) 08/29/2018   HCT 34.1 (L) 08/29/2018   MCV 98.6 08/29/2018   PLT 73 (L) 08/29/2018    Lab Results  Component Value Date   CREATININE 1.18 (H) 08/29/2018   BUN 15 08/29/2018   NA 140  08/29/2018   K 3.7 08/29/2018   CL 112 (H) 08/29/2018   CO2 21 (L) 08/29/2018    Lab Results  Component Value Date   ALT 10 08/29/2018   AST 10 (L) 08/29/2018   ALKPHOS 36 (L) 08/29/2018   BILITOT 0.7 08/29/2018     Microbiology: Recent Results (from the past 240 hour(s))  Blood Cultures x 2 sites     Status: None (Preliminary result)   Collection Time: 08/28/18  5:25 PM   Specimen: BLOOD RIGHT HAND  Result Value Ref  Range Status   Specimen Description   Final    BLOOD RIGHT HAND Performed at [facility] [address], [place], [place] [address]    Special Requests   Final    BOTTLES DRAWN AEROBIC ONLY Blood Culture adequate volume Performed at [facility] [address], [place], [place] [address]    Culture   Final    NO GROWTH < 12 HOURS Performed at [facility] [address], [place], [place] [address]    Report Status PENDING  Incomplete  Blood Cultures x 2 sites     Status: None (Preliminary result)   Collection Time: 08/28/18  5:25 PM   Specimen: BLOOD RIGHT HAND  Result Value Ref Range Status   Specimen Description   Final    BLOOD RIGHT HAND Performed at [facility] [address] [place], [place] [address]    Special Requests   Final    BOTTLES DRAWN AEROBIC ONLY Blood Culture results may not be optimal due to an inadequate volume of blood received in culture bottles Performed at [facility] [address] [place], [place] [address]    Culture   Final    NO GROWTH < 12 HOURS Performed at [facility] [address] [place], [place] [address]    Report Status PENDING  Incomplete    Michel Bickers, MD [facility] for Infectious [place] [facility] [phone] pager   [phone] cell 08/29/2018, 11:51 AM

## 2018-08-29 NOTE — Progress Notes (Signed)
Pharmacy Antibiotic Note  Danielle [name] is a 73 y.o. female admitted on 08/28/2018 with cellulitis, bacteremia.  Pharmacy has been consulted for vancomycin dosing.  ID following. Pt has recurrent left breast cellulitis and now BCID + 1/2 GPC growing CoNS mecA not detected. Antibiotics being changed from ampicillin/sulbactam to vancomycin.   Today, 08/29/18  WBC 8.4  SCr 1.18, CrCl 56 mL/min  Afebrile  Plan:  Vancomycin 2000 mg LD followed by 1000 mg IV q24h  Goal AUC 400-550  Follow renal function and culture data  Obtain vancomycin levels once at steady state  Height: 5\' 7"  (170.2 cm) Weight: 254 lb 3.1 oz (115.3 kg) IBW/kg (Calculated) : 61.6  Temp (24hrs), Avg:99.3 F (37.4 C), Min:98.6 F (37 C), Max:100.2 F (37.9 C)  Recent Labs  Lab 08/28/18 1725 08/29/18 0442  WBC 10.9* 8.4  CREATININE 1.04* 1.18*    Estimated Creatinine Clearance: 56.5 mL/min (A) (by C-G formula based on SCr of 1.18 mg/dL (H)).    Allergies  Allergen Reactions  . Lisinopril Cough  . Tetanus Toxoid Adsorbed Swelling    Arm swelling    Antimicrobials this admission: vancomycin 6/30 >>  Ampicillin/sulbactam  6/29 >> 6/30  Dose adjustments this admission:  Microbiology results: 6/29 BCx: 1/2 GPB, staphylococcus species, mecA not detected  Thank you for allowing pharmacy to be a part of this patient's care.  Lenis Noon, PharmD 08/29/2018 2:56 PM

## 2018-08-30 DIAGNOSIS — Z9221 Personal history of antineoplastic chemotherapy: Secondary | ICD-10-CM

## 2018-08-30 DIAGNOSIS — Z952 Presence of prosthetic heart valve: Secondary | ICD-10-CM

## 2018-08-30 LAB — BASIC METABOLIC PANEL
Anion gap: 8 (ref 5–15)
BUN: 13 mg/dL (ref 8–23)
CO2: 19 mmol/L — ABNORMAL LOW (ref 22–32)
Calcium: 8.4 mg/dL — ABNORMAL LOW (ref 8.9–10.3)
Chloride: 112 mmol/L — ABNORMAL HIGH (ref 98–111)
Creatinine, Ser: 0.93 mg/dL (ref 0.44–1.00)
GFR calc Af Amer: >60 mL/min (ref >60)
GFR calc non Af Amer: >60 mL/min (ref >60)
Glucose, Bld: 87 mg/dL (ref 70–99)
Potassium: 3.9 mmol/L (ref 3.5–5.1)
Sodium: 139 mmol/L (ref 135–145)

## 2018-08-30 LAB — CBC WITH DIFFERENTIAL/PLATELET
Abs Immature Granulocytes: 0.02 10*3/uL (ref 0.00–0.07)
Basophils Absolute: 0 10*3/uL (ref 0.0–0.1)
Basophils Relative: 0 %
Eosinophils Absolute: 0.4 10*3/uL (ref 0.0–0.5)
Eosinophils Relative: 5 %
HCT: 35 % — ABNORMAL LOW (ref 36.0–46.0)
Hemoglobin: 11 g/dL — ABNORMAL LOW (ref 12.0–15.0)
Immature Granulocytes: 0 %
Lymphocytes Relative: 16 %
Lymphs Abs: 1.2 10*3/uL (ref 0.7–4.0)
MCH: 31.1 pg (ref 26.0–34.0)
MCHC: 31.4 g/dL (ref 30.0–36.0)
MCV: 98.9 fL (ref 80.0–100.0)
Monocytes Absolute: 0.8 10*3/uL (ref 0.1–1.0)
Monocytes Relative: 10 %
Neutro Abs: 5.1 10*3/uL (ref 1.7–7.7)
Neutrophils Relative %: 69 %
Platelets: 74 10*3/uL — ABNORMAL LOW (ref 150–400)
RBC: 3.54 MIL/uL — ABNORMAL LOW (ref 3.87–5.11)
RDW: 14.6 % (ref 11.5–15.5)
WBC: 7.5 10*3/uL (ref 4.0–10.5)
nRBC: 0 % (ref 0.0–0.2)

## 2018-08-30 LAB — CULTURE, BLOOD (ROUTINE X 2)

## 2018-08-30 LAB — PROTIME-INR
INR: 2.1 — ABNORMAL HIGH (ref 0.8–1.2)
Prothrombin Time: 23.3 seconds — ABNORMAL HIGH (ref 11.4–15.2)

## 2018-08-30 MED ORDER — VANCOMYCIN HCL 10 G IV SOLR
1500.0000 mg | INTRAVENOUS | Status: DC
  Filled 2018-08-30: qty 1500

## 2018-08-30 MED ORDER — WARFARIN SODIUM 3 MG PO TABS
3.0000 mg | ORAL_TABLET | Freq: Every day | ORAL | Status: DC
  Administered 2018-08-30 – 2018-09-01 (×3): 3 mg via ORAL
  Filled 2018-08-30 (×3): qty 1

## 2018-08-30 MED ORDER — SODIUM CHLORIDE 0.9 % IV SOLN
2.0000 g | INTRAVENOUS | Status: DC
  Administered 2018-08-30 – 2018-09-01 (×3): 2 g via INTRAVENOUS
  Filled 2018-08-30 (×3): qty 2

## 2018-08-30 MED ORDER — FLUCONAZOLE 150 MG PO TABS
150.0000 mg | ORAL_TABLET | Freq: Once | ORAL | Status: AC
  Administered 2018-08-30: 150 mg via ORAL
  Filled 2018-08-30: qty 1

## 2018-08-30 MED ORDER — CLOTRIMAZOLE 2 % VA CREA
1.0000 | TOPICAL_CREAM | Freq: Every day | VAGINAL | Status: DC
  Filled 2018-08-30: qty 21

## 2018-08-30 MED ORDER — CLOTRIMAZOLE 1 % VA CREA
1.0000 | TOPICAL_CREAM | Freq: Every day | VAGINAL | Status: DC
  Filled 2018-08-30: qty 45

## 2018-08-30 NOTE — Progress Notes (Signed)
    CC: chest wall cellulitis and possible breast abscess  Subjective: Cellulitis looks a little bit better today.  But she complains of a very significant vaginal fungal infection.  Objective: Vital signs in last 24 hours: Temp:  [98.6 F (37 C)-98.8 F (37.1 C)] 98.8 F (37.1 C) (07/01 0455) Pulse Rate:  [78-83] 83 (07/01 0455) Resp:  [16-24] 24 (07/01 0455) BP: (118-135)/(53-72) 118/53 (07/01 0455) SpO2:  [94 %-99 %] 95 % (07/01 0759) Weight:  [115.7 kg] 115.7 kg (07/01 0452) Last BM Date: 08/29/18 360 PO 1300 IV Urine x 1 recorded Afebrile, VSS WBC 7.5   Intake/Output from previous day: 06/30 0701 - 07/01 0700 In: 1652.3 [P.O.:360; I.V.:685.9; IV Piggyback:606.4] Out: -  Intake/Output this shift: No intake/output data recorded.  General appearance: alert, cooperative and no distress Resp: clear to auscultation bilaterally Skin: Cellulitis looks a little bit better today not quite as red or tender.  She still has ongoing cellulitis.  Lab Results:  Recent Labs    08/29/18 0442 08/30/18 0427  WBC 8.4 7.5  HGB 10.7* 11.0*  HCT 34.1* 35.0*  PLT 73* 74*    BMET Recent Labs    08/29/18 0442 08/30/18 0427  NA 140 139  K 3.7 3.9  CL 112* 112*  CO2 21* 19*  GLUCOSE 92 87  BUN 15 13  CREATININE 1.18* 0.93  CALCIUM 8.4* 8.4*   PT/INR Recent Labs    08/29/18 0442 08/30/18 0427  LABPROT 22.0* 23.3*  INR 2.0* 2.1*    Recent Labs  Lab 08/29/18 0442  AST 10*  ALT 10  ALKPHOS 36*  BILITOT 0.7  PROT 5.8*  ALBUMIN 3.0*     Lipase  No results found for: LIPASE   Medications: . aspirin EC  81 mg Oral Daily  . carvedilol  25 mg Oral BID  . furosemide  20 mg Oral Daily  . gabapentin  600 mg Oral QHS  . losartan  50 mg Oral Daily  . methimazole  15 mg Oral QPM  . mometasone-formoterol  2 puff Inhalation BID  . rosuvastatin  40 mg Oral Daily  . senna-docusate  2 tablet Oral QHS  . tamoxifen  20 mg Oral Daily  . traMADol  50 mg Oral TID  .  valACYclovir  500 mg Oral BID  . warfarin  3 mg Oral q1800  . Warfarin - Pharmacist Dosing Inpatient   Does not apply q1800    Assessment/Plan S/p aortic valve replacement with prosthetic valve. CAD COPD Hypertension Hyperthyroid Obesity BMI 39.57  Recurrent left breast, chest wall and abdominal wall cellulitis, with 4 x 2 cm fluid collection deep left breast. Left breast cancer s/p left breast lumpectomy with radioactive seed sentinel lymph node biopsy 01/14/2017, Dr. Rolm Bookbinder Hx of ongoing chemotherapy, and Hx of radiation therapy for her left breast cancer  FEN: IV fluids/healthy diet ID: Unasyn 6/29 - 6/30; vancomycin 6/30 >>day 2 DVT: Coumadin INR 2.0 POC:   [place]an,[place] Spouse [phone]     Plan:'s been reviewed by Dr. Barry Dienes, it is her opinion that she does not have an abscess that the fluid collection is a seroma.  Our recommendation would be ongoing antibiotic therapy.  Will defer treatment of vaginal fungal infection to Dr. Algis Liming.       LOS: 2 days    Fumiko Cham 08/30/2018 [phone]

## 2018-08-30 NOTE — Progress Notes (Signed)
ANTICOAGULATION CONSULT NOTE - Initial Consult  Pharmacy Consult for warfarin Indication: atrial fibrillation  Allergies  Allergen Reactions  . Lisinopril Cough  . Tetanus Toxoid Adsorbed Swelling    Arm swelling    Patient Measurements: Height: 5\' 7"  (170.2 cm) Weight: 255 lb 1.2 oz (115.7 kg) IBW/kg (Calculated) : 61.6  Vital Signs: Temp: 98.8 F (37.1 C) (07/01 0455) BP: 118/53 (07/01 0455) Pulse Rate: 83 (07/01 0455)  Labs: Recent Labs    08/28/18 1725 08/28/18 1908 08/29/18 0442 08/30/18 0427  HGB 12.2  --  10.7* 11.0*  HCT 40.3  --  34.1* 35.0*  PLT 82*  --  73* 74*  LABPROT  --  21.3* 22.0* 23.3*  INR  --  1.9* 2.0* 2.1*  CREATININE 1.04*  --  1.18* 0.93    Estimated Creatinine Clearance: 71.8 mL/min (by C-G formula based on SCr of 0.93 mg/dL).   Medical History: Past Medical History:  Diagnosis Date  . Allergy   . Anxiety   . Aortic stenosis    Status post bioprosthetic AVR  . Arthritis    DJD  . Asthma   . Breast cancer ([place]) 12/13/2016   Left breast  . Chronic systolic CHF (congestive heart failure) ([place])    EF 30% 04/2014  . COPD (chronic obstructive pulmonary disease) ([place])   . Coronary artery disease    per pt, had LHC prior to AVR in [place] that did not show any blockages; no stents/bypass  . Gastritis   . Hyperlipidemia   . Hypertension   . Hyperthyroidism   . Multiple thyroid nodules   . Osteoporosis   . Paroxysmal atrial fibrillation (HCC)   . Pelvis fracture ([place]) 08/19/2015   MULTIPLE   . Personal history of radiation therapy 2018  . Spontaneous pneumothorax 11/28/2015   left   . Stroke [facility]) 2000   rt hand weak   Assessment: Pt admitted with concern for left abdominal wall abscess and started on ampicillin/sulbactam. Pharmacy consulted to dose/monitor warfarin. Pt was taking PTA for atrial fibrillation.  PTA dose (confirmed with outpatient notes):  Warfarin 1 mg PO on Wednesday  Warfarin 3 mg PO all other days of  the week  INR 2.1 today. Since it has been hovering around 1.9-2.1 on the home dose, we will change the dose to 3mg  daily. Change INR to TThSat  Goal of Therapy:  INR 2-3 Monitor platelets by anticoagulation protocol: Yes   Plan:   Warfarin 3 mg PO qday   Change INR to TTS   Onnie Boer, PharmD, BCIDP, AAHIVP, CPP Infectious Disease Pharmacist 08/30/2018 10:18 AM

## 2018-08-30 NOTE — Progress Notes (Signed)
[facility] for Infectious Disease   Reason for visit: Follow up on recurrent LT breast cellulitis vs. abscess  Interval History: erythema and LT breast tenderness slightly improved from yesterday per pt. General surgeon evaluating patient at beginning of my exam. We discussed management. Appetite fair today. She denies diarrhea but does worry about developing yeast vaginitis on ABX.  Blood cxs, ABX usage, fever curves, imaging, and WBC trends all independently reviewed   Vitals:   08/30/18 0759 08/30/18 1242  BP:  138/86  Pulse:  79  Resp:  16  Temp:  98.4 F (36.9 C)  SpO2: 95% 96%   Physical Exam Gen: pleasant, NAD, A&Ox 3 Head: NCAT, no temporal wasting evident EENT: PERRL, EOMI, MMM, adequate dentition Neck: supple, no JVD CV: NRRR, no murmurs evident Chest Wall: LT breast erythema persistent, area of induration at 3 o'clock noted as well, no nipple discharge noted or expressed Pulm: CTA bilaterally, no wheeze or retractions Abd: soft, NTND, +BS Extrems: no LE edema, 2+ pulses Skin: no rashes, adequate skin turgor, fingernails elongated but clean Neuro: CN II-XII grossly intact, no focal neurologic deficits appreciated, gait was not assessed, A&Ox 3  Review of Systems: LT breast tenderness x 4 weeks, +relapsing erythema (improved previously with PO ABX), denies F/C, no N/V, or SOB. All other systems reviewed and negative except as noted above.  Lab Results  Component Value Date   WBC 7.5 08/30/2018   HGB 11.0 (L) 08/30/2018   HCT 35.0 (L) 08/30/2018   MCV 98.9 08/30/2018   PLT 74 (L) 08/30/2018    Lab Results  Component Value Date   CREATININE 0.93 08/30/2018   BUN 13 08/30/2018   NA 139 08/30/2018   K 3.9 08/30/2018   CL 112 (H) 08/30/2018   CO2 19 (L) 08/30/2018    Lab Results  Component Value Date   ALT 10 08/29/2018   AST 10 (L) 08/29/2018   ALKPHOS 36 (L) 08/29/2018     Microbiology: Recent Results (from the past 240 hour(s))  Blood  Cultures x 2 sites     Status: None (Preliminary result)   Collection Time: 08/28/18  5:25 PM   Specimen: BLOOD RIGHT HAND  Result Value Ref Range Status   Specimen Description   Final    BLOOD RIGHT HAND Performed at [facility], [place] [address] [place], [place] [address]    Special Requests   Final    BOTTLES DRAWN AEROBIC ONLY Blood Culture adequate volume Performed at [facility] [address], [place], [place] [address]    Culture   Final    NO GROWTH 2 DAYS Performed at [facility] [address], [place], [place] [address]    Report Status PENDING  Incomplete  Blood Cultures x 2 sites     Status: Abnormal   Collection Time: 08/28/18  5:25 PM   Specimen: BLOOD RIGHT HAND  Result Value Ref Range Status   Specimen Description   Final    BLOOD RIGHT HAND Performed at [facility] [address], [place], [place] [address]    Special Requests   Final    BOTTLES DRAWN AEROBIC ONLY Blood Culture results may not be optimal due to an inadequate volume of blood received in culture bottles Performed at [facility] [address], [place], [place] [address]    Culture  Setup Time   Final    GRAM POSITIVE COCCI AEROBIC BOTTLE ONLY  CRITICAL RESULT CALLED TO, READ BACK BY AND VERIFIED WITH: Shelda Jakes PharmD 14:40 08/29/18 ([email or web address])    Culture (A)  Final    STAPHYLOCOCCUS SPECIES (COAGULASE NEGATIVE) THE SIGNIFICANCE OF ISOLATING THIS ORGANISM FROM A SINGLE SET OF BLOOD CULTURES WHEN MULTIPLE SETS ARE DRAWN IS UNCERTAIN. PLEASE NOTIFY THE MICROBIOLOGY DEPARTMENT WITHIN ONE WEEK IF SPECIATION AND SENSITIVITIES ARE REQUIRED. Performed at [facility], [place] [address], [place], [place] [address]    Report Status 08/30/2018 FINAL  Final  Blood Culture ID Panel (Reflexed)     Status: Abnormal   Collection Time: 08/28/18  5:25 PM  Result Value Ref Range Status   Enterococcus species NOT DETECTED  NOT DETECTED Final   Listeria monocytogenes NOT DETECTED NOT DETECTED Final   Staphylococcus species DETECTED (A) NOT DETECTED Final    Comment: Methicillin (oxacillin) susceptible coagulase negative staphylococcus. Possible blood culture contaminant (unless isolated from more than one blood culture draw or clinical case suggests pathogenicity). No antibiotic treatment is indicated for blood  culture contaminants. CRITICAL RESULT CALLED TO, READ BACK BY AND VERIFIED WITH: Shelda Jakes PharmD 14:40 08/29/18 ([email or web address])    Staphylococcus aureus (BCID) NOT DETECTED NOT DETECTED Final   Methicillin resistance NOT DETECTED NOT DETECTED Final   Streptococcus species NOT DETECTED NOT DETECTED Final   Streptococcus agalactiae NOT DETECTED NOT DETECTED Final   Streptococcus pneumoniae NOT DETECTED NOT DETECTED Final   Streptococcus pyogenes NOT DETECTED NOT DETECTED Final   Acinetobacter baumannii NOT DETECTED NOT DETECTED Final   Enterobacteriaceae species NOT DETECTED NOT DETECTED Final   Enterobacter cloacae complex NOT DETECTED NOT DETECTED Final   Escherichia coli NOT DETECTED NOT DETECTED Final   Klebsiella oxytoca NOT DETECTED NOT DETECTED Final   Klebsiella pneumoniae NOT DETECTED NOT DETECTED Final   Proteus species NOT DETECTED NOT DETECTED Final   Serratia marcescens NOT DETECTED NOT DETECTED Final   Haemophilus influenzae NOT DETECTED NOT DETECTED Final   Neisseria meningitidis NOT DETECTED NOT DETECTED Final   Pseudomonas aeruginosa NOT DETECTED NOT DETECTED Final   Candida albicans NOT DETECTED NOT DETECTED Final   Candida glabrata NOT DETECTED NOT DETECTED Final   Candida krusei NOT DETECTED NOT DETECTED Final   Candida parapsilosis NOT DETECTED NOT DETECTED Final   Candida tropicalis NOT DETECTED NOT DETECTED Final    Comment: Performed at [facility], [address], [place], [place] [address]  Novel Coronavirus,NAA,(SEND-OUT TO REF LAB - TAT 24-48 hrs); Hosp Order     Status:  None   Collection Time: 08/28/18  5:41 PM   Specimen: Nasopharyngeal Swab; Respiratory  Result Value Ref Range Status   SARS-CoV-2, NAA NOT DETECTED NOT DETECTED Final    Comment: (NOTE) This test was developed and its performance characteristics determined by [employer]. This test has not been FDA cleared or approved. This test has been authorized by FDA under an Emergency Use Authorization (EUA). This test is only authorized for the duration of time the declaration that circumstances exist justifying the authorization of the emergency use of in vitro diagnostic tests for detection of SARS-CoV-2 virus and/or diagnosis of COVID-19 infection under section 564(b)(1) of the Act, 21 U.S.C. 371GGY-6(R)(4), unless the authorization is terminated or revoked sooner. When diagnostic testing is negative, the possibility of a false negative result should be considered in the context of a patient's recent exposures and the presence of clinical signs and symptoms consistent with COVID-19. An individual without symptoms of COVID-19 and who is not shedding SARS-CoV-2 virus  would expect to have a negative (not detected) result in this assay. Performed  At: [facility] [place], [place] [id number] Rush Farmer MD MG:[id number]    [place]  Final    Comment: Performed at [facility] [address], [place], [place] [address]    Impression/Plan: Pt is a 73 y/o AAF withh/o AVR and h/o stage IB LT breast CA, s/p lumpectomy in 2018 and adjuvant chemotherapy admitted with recurrent LT breast cellulitis vs. Abscess and possible CoNS BSI.  1. Recurrent LT breast cellulitis - change vanc to rocephin given blood cx results and streaking erythema to LT breast that is worrisome for Streptococcal cellulitis. General surgeon wishes to defer intervention at this time. May need to complete ABX tx with parenteral therapy given recurrence  after PO ABX and h/o AVR with now questionable BSI. If fails to improve, pt will need aspiration of fluid collection noted on imaging. Duration to be based on clinical response to treatment.  2. 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 Will repeat blood cxs x 2 to re-assess. Plan to keep pt until repeat blood cxs are negative at 48 hrs.

## 2018-08-30 NOTE — Progress Notes (Signed)
Pharmacy Antibiotic Note  Danielle [name] is a 73 y.o. female admitted on 08/28/2018 with cellulitis, bacteremia.  Pharmacy has been consulted for vancomycin dosing.  ID following. Pt has recurrent left breast cellulitis and now BCID + 1/2 GPC growing CoNS mecA not detected. Antibiotics being changed from ampicillin/sulbactam to vancomycin.   Today, 08/30/18  D1 vancomycin (full day of therapy)  WBC WNL  SCr improved from yday  Afebrile  Blood cx from 6/29 with Meth susc CoNS 1/2 sets - likely contaminant but current abx cover pathogen   Plan:  Based on change in renal function, adjust vancomycin to 1500mg  IV q24h  Goal AUC 400-550  Follow renal function and culture data  Obtain vancomycin levels once at steady state  Height: 5\' 7"  (170.2 cm) Weight: 255 lb 1.2 oz (115.7 kg) IBW/kg (Calculated) : 61.6  Temp (24hrs), Avg:98.7 F (37.1 C), Min:98.6 F (37 C), Max:98.8 F (37.1 C)  Recent Labs  Lab 08/28/18 1725 08/29/18 0442 08/30/18 0427  WBC 10.9* 8.4 7.5  CREATININE 1.04* 1.18* 0.93    Estimated Creatinine Clearance: 71.8 mL/min (by C-G formula based on SCr of 0.93 mg/dL).    Allergies  Allergen Reactions  . Lisinopril Cough  . Tetanus Toxoid Adsorbed Swelling    Arm swelling    Antimicrobials this admission: vancomycin 6/30 >>  Ampicillin/sulbactam  6/29 >> 6/30  Dose adjustments this admission:  Microbiology results: 6/29 BCx: 1/2 GPB, staphylococcus species, mecA not detected  Thank you for allowing pharmacy to be a part of this patient's care.  Doreene Eland, PharmD, BCPS.   Work Cell: [phone] 08/30/2018 10:01 AM

## 2018-08-30 NOTE — Progress Notes (Addendum)
PROGRESS NOTE   Danielle [name]  XMD:[id number]    DOB: Mar 25, 1945    DOA: 08/28/2018  PCP: [name], [name], MD   I have briefly reviewed patients previous medical records in [facility].  Brief Narrative:  73 year old female with PMH of aortic stenosis s/p bioprosthetic AVR, asthma, left breast cancer status post lumpectomy and radiation, COPD, CAD, HTN, hypothyroid developed sudden onset of redness, warmth but no significant pain of her left breast on 08/07/2018, seen by her oncologist and her cellulitis completely resolved after 7 days course of doxycycline, developed recurrence of similar symptoms on 6/29 prompting hospital admission.CCS consulted and no drainable abscess.  ID consulted.   Assessment & Plan:   Principal Problem:   Cellulitis of left breast Active Problems:   Essential hypertension   Asthma   Paroxysmal atrial fibrillation (HCC)   Chronic systolic (congestive) heart failure (HCC)   Hyperthyroidism   Morbid obesity due to excess calories (HCC)   NICM (nonischemic cardiomyopathy) ([place])   Malignant neoplasm of upper-outer quadrant of left breast in female, estrogen receptor positive (HCC)   CKD (chronic kidney disease), stage III (HCC)   Cellulitis of left abdominal wall   Bacteremia due to Gram-positive bacteria   Abscess   Recurrent left breast cellulitis ID, CCS and oncology input appreciated. Although on imaging there was some concern for fluid collection, CCS does not suspect any abscess and have signed off 7/1. ID concerned for MRSA cellulitis given recent prompt improvement on doxycycline and hence have changed IV Unasyn to vancomycin.  Uncomplicated vulvovaginal candidiasis Patient declines GYN Lotrimin PV and insists on oral Diflucan.  Oral Diflucan 150 mg x 1 dose.  1/4 blood cultures positive for methicillin sensitive coagulase-negative staph Possible contaminant but could be related to cellulitis as well. ID on board and currently  on IV vancomycin.  Paroxysmal atrial fibrillation Currently in sinus rhythm.  Continue carvedilol for rate control and Coumadin per pharmacy.  Malignant neoplasm of the upper outer quadrant of the left breast stage Ib ER/PR positive, HER-2 negative, grade 2 oncotype IV Status post lumpectomy 12/2016 with adjuvant radiation January to February 2019.  Patient began tamoxifen in May 2019 and currently tolerating it.  Continue tamoxifen.  Oncology following.  Coronary artery disease/aortic stenosis/history of chronic systolic CHF due to nonischemic cardiomyopathy Currently stable.  Continue home regimen of Coreg, Lasix, Cozaar, aspirin, Crestor, Coumadin.  Chronic pain Continue home pain regimen.  Continue Neurontin, Ultram.  Controlled.  Chronic kidney disease stage III Stable.  Follow.  Essential hypertension Controlled.  Continue Coreg, Lasix, Cozaar.  History of recurrent genital herpes Continue Valtrex.  Hyperthyroidism Continue methimazole.  Outpatient follow-up with PCP.  Clinically euthyroid.  Thrombocytopenia, mild chronic Stable.  As per oncology ITP versus aortic stenosis related.   DVT prophylaxis: Coumadin anticoagulation, INR 2.1. Code Status: DNR Family Communication: None at bedside Disposition: DC home pending clinical improvement   Consultants:  ID General surgery Medical oncology  Procedures:  None  Antimicrobials:   IV Unasyn 08/28/2018>>>> 08/29/2018  IV vancomycin 08/29/2018    Subjective: Patient interviewed and examined along with female nursing tech as chaperone in the room.  Reports that left breast redness and warmth are slightly better.  Never did have pain in that breast.  Reports vaginal itching.  ROS: Above, otherwise negative.  Objective:  Vitals:   08/30/18 0452 08/30/18 0455 08/30/18 0759 08/30/18 1242  BP:  (!) 118/53  138/86  Pulse:  83  79  Resp:  (!) 24  16  Temp:  98.8 F (37.1 C)  98.4 F (36.9 C)  TempSrc:     Oral  SpO2:  94% 95% 96%  Weight: 115.7 kg     Height:        Examination:  General exam: Pleasant elderly female, moderately built and obese sitting up comfortably in chair. Respiratory system: Clear to auscultation. Respiratory effort normal. Cardiovascular system: S1 & S2 heard, RRR. No JVD, murmurs, rubs, gallops or clicks. No pedal edema.  Telemetry personally reviewed: Sinus rhythm.  12 beat nonsustained wide-complex tachycardia,?  NSVT versus A. fib with aberrancy noted on 6/30 at 5:49 PM. Gastrointestinal system: Abdomen is nondistended, soft and nontender. No organomegaly or masses felt. Normal bowel sounds heard. Central nervous system: Alert and oriented. No focal neurological deficits. Extremities: Symmetric 5 x 5 power. Skin: Midline CABG scar.  Left breast with diffuse induration, moderate erythema but no significant warmth or tenderness.  No fluctuance or crepitus.  No drainage. Psychiatry: Judgement and insight appear normal. Mood & affect appropriate.     Data Reviewed: I have personally reviewed following labs and imaging studies  CBC: Recent Labs  Lab 08/28/18 1725 08/29/18 0442 08/30/18 0427  WBC 10.9* 8.4 7.5  NEUTROABS 9.0*  --  5.1  HGB 12.2 10.7* 11.0*  HCT 40.3 34.1* 35.0*  MCV 99.8 98.6 98.9  PLT 82* 73* 74*   Basic Metabolic Panel: Recent Labs  Lab 08/28/18 1725 08/29/18 0442 08/30/18 0427  NA 140 140 139  K 3.9 3.7 3.9  CL 111 112* 112*  CO2 21* 21* 19*  GLUCOSE 90 92 87  BUN '13 15 13  ' CREATININE 1.04* 1.18* 0.93  CALCIUM 9.3 8.4* 8.4*  MG 2.1  --   --    Liver Function Tests: Recent Labs  Lab 08/29/18 0442  AST 10*  ALT 10  ALKPHOS 36*  BILITOT 0.7  PROT 5.8*  ALBUMIN 3.0*   Coagulation Profile: Recent Labs  Lab 08/28/18 1908 08/29/18 0442 08/30/18 0427  INR 1.9* 2.0* 2.1*   Cardiac Enzymes: No results for input(s): CKTOTAL, CKMB, CKMBINDEX, TROPONINI in the last 168 hours. HbA1C: Recent Labs    08/28/18 1725   HGBA1C 5.0   CBG: No results for input(s): GLUCAP in the last 168 hours.  Recent Results (from the past 240 hour(s))  Blood Cultures x 2 sites     Status: None (Preliminary result)   Collection Time: 08/28/18  5:25 PM   Specimen: BLOOD RIGHT HAND  Result Value Ref Range Status   Specimen Description   Final    BLOOD RIGHT HAND Performed at [facility] [address] [place], [place] [address]    Special Requests   Final    BOTTLES DRAWN AEROBIC ONLY Blood Culture adequate volume Performed at [facility] [address], [place], [place] [address]    Culture   Final    NO GROWTH 2 DAYS Performed at [facility] [address] [place], [place] [address]    Report Status PENDING  Incomplete  Blood Cultures x 2 sites     Status: Abnormal   Collection Time: 08/28/18  5:25 PM   Specimen: BLOOD RIGHT HAND  Result Value Ref Range Status   Specimen Description   Final    BLOOD RIGHT HAND Performed at [facility] [address], [place], [place] [address]    Special Requests   Final    BOTTLES DRAWN AEROBIC ONLY Blood Culture  results may not be optimal due to an inadequate volume of blood received in culture bottles Performed at [facility], [place] [address], [place], [place] [address]    Culture  Setup Time   Final    GRAM POSITIVE COCCI AEROBIC BOTTLE ONLY CRITICAL RESULT CALLED TO, READ BACK BY AND VERIFIED WITH: Shelda Jakes PharmD 14:40 08/29/18 ([email or web address])    Culture (A)  Final    STAPHYLOCOCCUS SPECIES (COAGULASE NEGATIVE) THE SIGNIFICANCE OF ISOLATING THIS ORGANISM FROM A SINGLE SET OF BLOOD CULTURES WHEN MULTIPLE SETS ARE DRAWN IS UNCERTAIN. PLEASE NOTIFY THE MICROBIOLOGY DEPARTMENT WITHIN ONE WEEK IF SPECIATION AND SENSITIVITIES ARE REQUIRED. Performed at [facility], [place] [address] [place], [place] [address]    Report Status 08/30/2018 FINAL  Final  Blood Culture ID Panel  (Reflexed)     Status: Abnormal   Collection Time: 08/28/18  5:25 PM  Result Value Ref Range Status   Enterococcus species NOT DETECTED NOT DETECTED Final   Listeria monocytogenes NOT DETECTED NOT DETECTED Final   Staphylococcus species DETECTED (A) NOT DETECTED Final    Comment: Methicillin (oxacillin) susceptible coagulase negative staphylococcus. Possible blood culture contaminant (unless isolated from more than one blood culture draw or clinical case suggests pathogenicity). No antibiotic treatment is indicated for blood  culture contaminants. CRITICAL RESULT CALLED TO, READ BACK BY AND VERIFIED WITH: Shelda Jakes PharmD 14:40 08/29/18 ([email or web address])    Staphylococcus aureus (BCID) NOT DETECTED NOT DETECTED Final   Methicillin resistance NOT DETECTED NOT DETECTED Final   Streptococcus species NOT DETECTED NOT DETECTED Final   Streptococcus agalactiae NOT DETECTED NOT DETECTED Final   Streptococcus pneumoniae NOT DETECTED NOT DETECTED Final   Streptococcus pyogenes NOT DETECTED NOT DETECTED Final   Acinetobacter baumannii NOT DETECTED NOT DETECTED Final   Enterobacteriaceae species NOT DETECTED NOT DETECTED Final   Enterobacter cloacae complex NOT DETECTED NOT DETECTED Final   Escherichia coli NOT DETECTED NOT DETECTED Final   Klebsiella oxytoca NOT DETECTED NOT DETECTED Final   Klebsiella pneumoniae NOT DETECTED NOT DETECTED Final   Proteus species NOT DETECTED NOT DETECTED Final   Serratia marcescens NOT DETECTED NOT DETECTED Final   Haemophilus influenzae NOT DETECTED NOT DETECTED Final   Neisseria meningitidis NOT DETECTED NOT DETECTED Final   Pseudomonas aeruginosa NOT DETECTED NOT DETECTED Final   Candida albicans NOT DETECTED NOT DETECTED Final   Candida glabrata NOT DETECTED NOT DETECTED Final   Candida krusei NOT DETECTED NOT DETECTED Final   Candida parapsilosis NOT DETECTED NOT DETECTED Final   Candida tropicalis NOT DETECTED NOT DETECTED Final    Comment: Performed at [facility], [address] [place], [place] [address]  Novel Coronavirus,NAA,(SEND-OUT TO REF LAB - TAT 24-48 hrs); Hosp Order     Status: None   Collection Time: 08/28/18  5:41 PM   Specimen: Nasopharyngeal Swab; Respiratory  Result Value Ref Range Status   SARS-CoV-2, NAA NOT DETECTED NOT DETECTED Final    Comment: (NOTE) This test was developed and its performance characteristics determined by [employer]. This test has not been FDA cleared or approved. This test has been authorized by FDA under an Emergency Use Authorization (EUA). This test is only authorized for the duration of time the declaration that circumstances exist justifying the authorization of the emergency use of in vitro diagnostic tests for detection of SARS-CoV-2 virus and/or diagnosis of COVID-19 infection under section 564(b)(1) of the Act, 21 U.S.C. 903ESP-2(Z)(3), unless the authorization is terminated or revoked  sooner. When diagnostic testing is negative, the possibility of a false negative result should be considered in the context of a patient's recent exposures and the presence of clinical signs and symptoms consistent with COVID-19. An individual without symptoms of COVID-19 and who is not shedding SARS-CoV-2 virus would expect to have a negative (not detected) result in this assay. Performed  At: [facility] [place], [place] [id number] Rush Farmer MD VH:[id number]    [place]  Final    Comment: Performed at [facility] [address] [place], [place] [address]         Radiology Studies: Ct Abdomen Wo Contrast  Result Date: 08/28/2018 CLINICAL DATA:  Left upper abdominal tenderness and pain. Cellulitis. Erysipelas. EXAM: CT ABDOMEN WITHOUT CONTRAST TECHNIQUE: Multidetector CT imaging of the abdomen was performed following the standard protocol without IV contrast. COMPARISON:  None. FINDINGS: Lower chest: There is  thickening of the skin of the left breast. There is a 4 x 2 cm fluid collection deep in the left breast adjacent to the chest wall with a small metallic marker adjacent to the collection. This probably represents a seroma from previous breast surgery. There is slight thickening of the skin along the left lateral aspect of the upper abdomen without underlying cellulitis or abscess. No infiltrates or effusions. Small emphysematous blebs at both lung bases. Aortic atherosclerosis. Hepatobiliary: Numerous small gallstones. 18 mm low-density lesion in the right lobe of the liver adjacent to the gallbladder consistent with a cyst. 8 mm low-density lesion in the small left lobe of the liver, also probably a cyst. No dilated bile ducts. Pancreas: Unremarkable. No pancreatic ductal dilatation or surrounding inflammatory changes. Spleen: Normal in size without focal abnormality. Adrenals/Urinary Tract: Normal adrenal glands. Small exophytic cysts in the left kidney. No hydronephrosis. Stomach/Bowel: Small hiatal hernia. Scattered diverticula in the splenic flexure of the colon. Visualized small bowel is normal. Vascular/Lymphatic: Aortic atherosclerosis. No enlarged abdominal lymph nodes. Slightly dilated left common iliac artery to 17 mm. Other: No ascites, free air, or abdominal wall hernia. Musculoskeletal: Multilevel degenerative disc disease in the lumbar spine. No acute bony abnormality. IMPRESSION: 1. Skin thickening of the left breast and more subtle skin thickening adjacent to the left upper quadrant of the abdomen with no underlying cellulitis or abscess. 2. Small fluid collection deep within the left breast is probably a seroma from previous breast surgery. 3. Cholelithiasis. 4.  Aortic Atherosclerosis (ICD10-I70.0). 5. 17 mm dilatation of the left common iliac artery. Electronically Signed   By: Lorriane Shire M.D.   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 CT exam performed 08/28/2018 shows a 4 x 2 centimeter fluid collection deep in the LEFT breast adjacent to the chest wall. Skin thickening of the LEFT breast was also noted on CT exam. Patient has diffuse erythema and pain in the LEFT breast. EXAM: ULTRASOUND OF THE LEFT BREAST COMPARISON:  CT exam on 08/28/2018 and multiple previous breast imaging studies, including diagnostic 12/15/2017. FINDINGS: Targeted ultrasound is performed, showing an near anechoic irregular mass with irregular margins in the 6 o'clock location of the LEFT breast, posterior in location which measures 2.2 x 4.2 x 1.5 centimeters. There is no associated internal blood flow. There is skin and trabecular thickening of the LEFT breast. IMPRESSION: Fluid collection deep within the 6 o'clock location of  the LEFT breast is favored to represent seroma cavity. Although there is skin and trabecular thickening in the central portion of the breast, no discrete fluid collections are identified to indicate presence of abscess. The skin and trabecular thickening could be related to post treatment changes or mastitis. RECOMMENDATION: Appropriate treatment if needed for possible mastitis. Recommend follow-up LEFT mammogram and ultrasound at a Breast Imaging facility. I have discussed the findings and recommendations with the patient. Results were also provided in writing at the conclusion of the visit. If applicable, a reminder letter will be sent to the patient regarding the next appointment. Electronically Signed   By: Nolon Nations M.D.   On: 08/29/2018 11:24        Scheduled Meds: . aspirin EC  81 mg Oral Daily  . carvedilol  25 mg Oral BID  . furosemide  20 mg Oral Daily  . gabapentin  600 mg Oral QHS  . losartan  50 mg Oral Daily  . methimazole  15 mg Oral QPM  . mometasone-formoterol  2 puff Inhalation BID  . rosuvastatin  40 mg Oral Daily  .  senna-docusate  2 tablet Oral QHS  . tamoxifen  20 mg Oral Daily  . traMADol  50 mg Oral TID  . valACYclovir  500 mg Oral BID  . warfarin  3 mg Oral q1800  . Warfarin - Pharmacist Dosing Inpatient   Does not apply q1800   Continuous Infusions: . vancomycin       LOS: 2 days     Vernell Leep, MD, FACP, [facility]. [facility]  To contact the attending provider between 7A-7P or the covering provider during after hours 7P-7A, please log into the web site [email or web address] and access using universal [place] password for that web site. If you do not have the password, please call the hospital operator.  08/30/2018, 2:24 PM

## 2018-08-31 LAB — PROTIME-INR
INR: 2.2 — ABNORMAL HIGH (ref 0.8–1.2)
Prothrombin Time: 23.7 seconds — ABNORMAL HIGH (ref 11.4–15.2)

## 2018-08-31 NOTE — Progress Notes (Signed)
Danielle [name]   DOB:October 04, 1945   WP#:[id number]   NKN#:[id number]  Oncology follow up  Subjective: Pt is doing better, left breast pain has improved. She is afebrile, with stable VS.   Objective:  Vitals:   08/31/18 0806 08/31/18 0808  BP:    Pulse:    Resp:    Temp:    SpO2: 97% 97%    Body mass index is 39.57 kg/m.  Intake/Output Summary (Last 24 hours) at 08/31/2018 1640 Last data filed at 08/31/2018 1600 Gross per 24 hour  Intake 1056 ml  Output -  Net 1056 ml     Sclerae unicteric  Oropharynx clear  No peripheral adenopathy  Lungs clear -- no rales or rhonchi  Heart regular rate and rhythm  Abdomen benign  Neuro nonfocal  Breast exam: Diffuse left breast skin hyperpigmentation with mild lymphedema, tenderness improved. (+) Left chest wall and upper abdomen wall skin hyperpigmentation   CBG (last 3)  No results for input(s): GLUCAP in the last 72 hours.   Labs:  Urine Studies No results for input(s): UHGB, CRYS in the last 72 hours.  Invalid input(s): UACOL, UAPR, USPG, UPH, UTP, UGL, UKET, UBIL, UNIT, UROB, ULEU, UEPI, UWBC, URBC, [place], CAST, [place], [place]  Basic Metabolic Panel: Recent Labs  Lab 08/28/18 1725 08/29/18 0442 08/30/18 0427  NA 140 140 139  K 3.9 3.7 3.9  CL 111 112* 112*  CO2 21* 21* 19*  GLUCOSE 90 92 87  BUN 13 15 13   CREATININE 1.04* 1.18* 0.93  CALCIUM 9.3 8.4* 8.4*  MG 2.1  --   --    GFR Estimated Creatinine Clearance: 71.5 mL/min (by C-G formula based on SCr of 0.93 mg/dL). Liver Function Tests: Recent Labs  Lab 08/29/18 0442  AST 10*  ALT 10  ALKPHOS 36*  BILITOT 0.7  PROT 5.8*  ALBUMIN 3.0*   No results for input(s): LIPASE, AMYLASE in the last 168 hours. No results for input(s): AMMONIA in the last 168 hours. Coagulation profile Recent Labs  Lab 08/28/18 1908 08/29/18 0442 08/30/18 0427 08/31/18 0740  INR 1.9* 2.0* 2.1* 2.2*    CBC: Recent Labs  Lab 08/28/18 1725 08/29/18 0442 08/30/18 0427   WBC 10.9* 8.4 7.5  NEUTROABS 9.0*  --  5.1  HGB 12.2 10.7* 11.0*  HCT 40.3 34.1* 35.0*  MCV 99.8 98.6 98.9  PLT 82* 73* 74*   Cardiac Enzymes: No results for input(s): CKTOTAL, CKMB, CKMBINDEX, TROPONINI in the last 168 hours. BNP: Invalid input(s): POCBNP CBG: No results for input(s): GLUCAP in the last 168 hours. D-Dimer No results for input(s): DDIMER in the last 72 hours. Hgb A1c Recent Labs    08/28/18 1725  HGBA1C 5.0   Lipid Profile No results for input(s): CHOL, HDL, LDLCALC, TRIG, CHOLHDL, LDLDIRECT in the last 72 hours. Thyroid function studies No results for input(s): TSH, T4TOTAL, T3FREE, THYROIDAB in the last 72 hours.  Invalid input(s): FREET3 Anemia work up No results for input(s): VITAMINB12, FOLATE, FERRITIN, TIBC, IRON, RETICCTPCT in the last 72 hours. Microbiology Recent Results (from the past 240 hour(s))  Blood Cultures x 2 sites     Status: None (Preliminary result)   Collection Time: 08/28/18  5:25 PM   Specimen: BLOOD RIGHT HAND  Result Value Ref Range Status   Specimen Description   Final    BLOOD RIGHT HAND Performed at [facility], [place] [address] [place], [place] [address]    Special Requests   Final  BOTTLES DRAWN AEROBIC ONLY Blood Culture adequate volume Performed at [place] [address], [place], [place] [address]    Culture   Final    NO GROWTH 3 DAYS Performed at [facility], [place] [address], [place], [place] [address]    Report Status PENDING  Incomplete  Blood Cultures x 2 sites     Status: Abnormal   Collection Time: 08/28/18  5:25 PM   Specimen: BLOOD RIGHT HAND  Result Value Ref Range Status   Specimen Description   Final    BLOOD RIGHT HAND Performed at [place] [address], [place], [place] [address]    Special Requests   Final    BOTTLES DRAWN AEROBIC ONLY Blood Culture results may not be optimal due to an inadequate volume of blood  received in culture bottles Performed at [place] [address] [place], [place] [address]    Culture  Setup Time   Final    GRAM POSITIVE COCCI AEROBIC BOTTLE ONLY CRITICAL RESULT CALLED TO, READ BACK BY AND VERIFIED WITH: Shelda Jakes PharmD 14:40 08/29/18 ([email or web address])    Culture (A)  Final    STAPHYLOCOCCUS SPECIES (COAGULASE NEGATIVE) THE SIGNIFICANCE OF ISOLATING THIS ORGANISM FROM A SINGLE SET OF BLOOD CULTURES WHEN MULTIPLE SETS ARE DRAWN IS UNCERTAIN. PLEASE NOTIFY THE MICROBIOLOGY DEPARTMENT WITHIN ONE WEEK IF SPECIATION AND SENSITIVITIES ARE REQUIRED. Performed at [facility], [place] [address], [place], [place] [address]    Report Status 08/30/2018 FINAL  Final  Blood Culture ID Panel (Reflexed)     Status: Abnormal   Collection Time: 08/28/18  5:25 PM  Result Value Ref Range Status   Enterococcus species NOT DETECTED NOT DETECTED Final   Listeria monocytogenes NOT DETECTED NOT DETECTED Final   Staphylococcus species DETECTED (A) NOT DETECTED Final    Comment: Methicillin (oxacillin) susceptible coagulase negative staphylococcus. Possible blood culture contaminant (unless isolated from more than one blood culture draw or clinical case suggests pathogenicity). No antibiotic treatment is indicated for blood  culture contaminants. CRITICAL RESULT CALLED TO, READ BACK BY AND VERIFIED WITH: Shelda Jakes PharmD 14:40 08/29/18 ([email or web address])    Staphylococcus aureus (BCID) NOT DETECTED NOT DETECTED Final   Methicillin resistance NOT DETECTED NOT DETECTED Final   Streptococcus species NOT DETECTED NOT DETECTED Final   Streptococcus agalactiae NOT DETECTED NOT DETECTED Final   Streptococcus pneumoniae NOT DETECTED NOT DETECTED Final   Streptococcus pyogenes NOT DETECTED NOT DETECTED Final   Acinetobacter baumannii NOT DETECTED NOT DETECTED Final   Enterobacteriaceae species NOT DETECTED NOT DETECTED Final   Enterobacter cloacae complex NOT DETECTED NOT DETECTED Final    Escherichia coli NOT DETECTED NOT DETECTED Final   Klebsiella oxytoca NOT DETECTED NOT DETECTED Final   Klebsiella pneumoniae NOT DETECTED NOT DETECTED Final   Proteus species NOT DETECTED NOT DETECTED Final   Serratia marcescens NOT DETECTED NOT DETECTED Final   Haemophilus influenzae NOT DETECTED NOT DETECTED Final   Neisseria meningitidis NOT DETECTED NOT DETECTED Final   Pseudomonas aeruginosa NOT DETECTED NOT DETECTED Final   Candida albicans NOT DETECTED NOT DETECTED Final   Candida glabrata NOT DETECTED NOT DETECTED Final   Candida krusei NOT DETECTED NOT DETECTED Final   Candida parapsilosis NOT DETECTED NOT DETECTED Final   Candida tropicalis NOT DETECTED NOT DETECTED Final    Comment: Performed at [facility], [address], [place], [place] [address]  Novel Coronavirus,NAA,(SEND-OUT TO REF LAB - TAT  24-48 hrs); Hosp Order     Status: None   Collection Time: 08/28/18  5:41 PM   Specimen: Nasopharyngeal Swab; Respiratory  Result Value Ref Range Status   SARS-CoV-2, NAA NOT DETECTED NOT DETECTED Final    Comment: (NOTE) This test was developed and its performance characteristics determined by [employer]. This test has not been FDA cleared or approved. This test has been authorized by FDA under an Emergency Use Authorization (EUA). This test is only authorized for the duration of time the declaration that circumstances exist justifying the authorization of the emergency use of in vitro diagnostic tests for detection of SARS-CoV-2 virus and/or diagnosis of COVID-19 infection under section 564(b)(1) of the Act, 21 U.S.C. 623JSE-8(B)(1), unless the authorization is terminated or revoked sooner. When diagnostic testing is negative, the possibility of a false negative result should be considered in the context of a patient's recent exposures and the presence of clinical signs and symptoms consistent with COVID-19. An individual without symptoms of COVID-19 and  who is not shedding SARS-CoV-2 virus would expect to have a negative (not detected) result in this assay. Performed  At: [facility] [place], [place] [id number] Rush Farmer MD [id number]    [place]  Final    Comment: Performed at [place] [address], [place], [place] [address]  Culture, blood (routine x 2)     Status: None (Preliminary result)   Collection Time: 08/30/18  3:17 PM   Specimen: BLOOD  Result Value Ref Range Status   Specimen Description   Final    BLOOD RIGHT ANTECUBITAL Performed at [place] [address] [place], [place] [address]    Special Requests   Final    BOTTLES DRAWN AEROBIC ONLY Blood Culture adequate volume Performed at [place] [address] [place], [place] [address]    Culture   Final    NO GROWTH < 24 HOURS Performed at [place] [address] [place], [place] [address]    Report Status PENDING  Incomplete  Culture, blood (routine x 2)     Status: None (Preliminary result)   Collection Time: 08/30/18  3:17 PM   Specimen: BLOOD  Result Value Ref Range Status   Specimen Description   Final    BLOOD RIGHT ANTECUBITAL Performed at [place] [address], [place], [place] [address]    Special Requests   Final    BOTTLES DRAWN AEROBIC ONLY Blood Culture adequate volume Performed at [place] [address] [place], [place] [address]    Culture   Final    NO GROWTH < 24 HOURS Performed at [facility] [address], [place], [place] [address]    Report Status PENDING  Incomplete      Studies:  No results found.  Assessment: 73 y.o. with PMH of stage I left breast cancer, s/p lumpectomy and radiation, currently on tamoxifen, admitted for recurrent left breast cellulitis  1. 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 methicillin  sensitive coagulase-negative staph bacteriemia, ? Contamination  2. History of stage I left breast cancer  3. CAD, AF, CHF, stable 4. 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 CKD stage III   Plan:  -one of her 4 initial blood culture was positive for methicillin sensitive coagulase-negative staph, possible skin contamination, repeated blood cultures were done. Pt did have bioprosthetic aortic  valve replacement in [place] in 03/2014, if repeated blood culture positive, then she would need TTE or TEE  -she is on vancomycin, per ID and primary team.  -if repeated blood negative and she gets discharged over the weekend, I will f/u her in 2-3 weeks in my clinic. I appreciate the excellent care from the hospitalist team.   [name], MD 08/31/2018  4:40 PM

## 2018-08-31 NOTE — Progress Notes (Signed)
[facility] for Infectious Disease   Reason for visit: Follow up on recurrent LT breast cellulitis vs. abscess  Interval History: significant improvement in tracking erythema and induration over the past 24 hrs. C/o itching along lateral chest wall at prior lumpectomy incision. Appetite and ambulation improving Fever curve, WBC trends, ABX usage, and imaging all independently reviewed   Physical Exam: Afebrile, VSS Vitals:   08/31/18 0806 08/31/18 0808  BP:    Pulse:    Resp:    Temp:    SpO2: 97% 97%  Physical Exam Gen: pleasant, NAD, A&Ox 3 Head: NCAT, no temporal wasting evident EENT: PERRL, EOMI, MMM, adequate dentition Neck: supple, no JVD CV: NRRR, no murmurs evident Chest Wall: LT breast erythema much improved, area of induration at 3 o'clock decreased as well, no nipple discharge noted or expressed Pulm: CTA bilaterally, no wheeze or retractions Abd: soft, NTND, +BS Extrems: no LE edema, 2+ pulses Skin: no rashes, adequate skin turgor, fingernails elongated but clean Neuro: CN II-XII grossly intact, no focal neurologic deficits appreciated, gait was not assessed, A&Ox 3  Review of Systems: LT breast tenderness x 4 weeks, +relapsing erythema (improved previously with PO ABX), denies F/C, no N/V, or SOB. All other systems reviewed and negative except as noted above.  Lab Results  Component Value Date   WBC 7.5 08/30/2018   HGB 11.0 (L) 08/30/2018   HCT 35.0 (L) 08/30/2018   MCV 98.9 08/30/2018   PLT 74 (L) 08/30/2018    Lab Results  Component Value Date   CREATININE 0.93 08/30/2018   BUN 13 08/30/2018   NA 139 08/30/2018   K 3.9 08/30/2018   CL 112 (H) 08/30/2018   CO2 19 (L) 08/30/2018    Lab Results  Component Value Date   ALT 10 08/29/2018   AST 10 (L) 08/29/2018   ALKPHOS 36 (L) 08/29/2018     Microbiology: Recent Results (from the past 240 hour(s))  Blood Cultures x 2 sites     Status: None (Preliminary result)   Collection Time: 08/28/18   5:25 PM   Specimen: BLOOD RIGHT HAND  Result Value Ref Range Status   Specimen Description   Final    BLOOD RIGHT HAND Performed at [facility], [place] [address], [place], [place] [address]    Special Requests   Final    BOTTLES DRAWN AEROBIC ONLY Blood Culture adequate volume Performed at [place] [address], [place], [place] [address]    Culture   Final    NO GROWTH 3 DAYS Performed at [facility], [place] [address] [place], [place] [address]    Report Status PENDING  Incomplete  Blood Cultures x 2 sites     Status: Abnormal   Collection Time: 08/28/18  5:25 PM   Specimen: BLOOD RIGHT HAND  Result Value Ref Range Status   Specimen Description   Final    BLOOD RIGHT HAND Performed at [facility] [address], [place], [place] [address]    Special Requests   Final    BOTTLES DRAWN AEROBIC ONLY Blood Culture results may not be optimal due to an inadequate volume of blood received in culture bottles Performed at [facility] [address], [place], [place] [address]    Culture  Setup Time   Final    GRAM POSITIVE COCCI AEROBIC BOTTLE ONLY CRITICAL RESULT CALLED TO, READ BACK BY AND VERIFIED WITH: Shelda Jakes PharmD 14:40  08/29/18 ([email or web address])    Culture (A)  Final    STAPHYLOCOCCUS SPECIES (COAGULASE NEGATIVE) THE SIGNIFICANCE OF ISOLATING THIS ORGANISM FROM A SINGLE SET OF BLOOD CULTURES WHEN MULTIPLE SETS ARE DRAWN IS UNCERTAIN. PLEASE NOTIFY THE MICROBIOLOGY DEPARTMENT WITHIN ONE WEEK IF SPECIATION AND SENSITIVITIES ARE REQUIRED. Performed at [facility], [place] [address], [place], [place] [address]    Report Status 08/30/2018 FINAL  Final  Blood Culture ID Panel (Reflexed)     Status: Abnormal   Collection Time: 08/28/18  5:25 PM  Result Value Ref Range Status   Enterococcus species NOT DETECTED NOT DETECTED Final   Listeria monocytogenes NOT DETECTED NOT DETECTED Final    Staphylococcus species DETECTED (A) NOT DETECTED Final    Comment: Methicillin (oxacillin) susceptible coagulase negative staphylococcus. Possible blood culture contaminant (unless isolated from more than one blood culture draw or clinical case suggests pathogenicity). No antibiotic treatment is indicated for blood  culture contaminants. CRITICAL RESULT CALLED TO, READ BACK BY AND VERIFIED WITH: Shelda Jakes PharmD 14:40 08/29/18 ([email or web address])    Staphylococcus aureus (BCID) NOT DETECTED NOT DETECTED Final   Methicillin resistance NOT DETECTED NOT DETECTED Final   Streptococcus species NOT DETECTED NOT DETECTED Final   Streptococcus agalactiae NOT DETECTED NOT DETECTED Final   Streptococcus pneumoniae NOT DETECTED NOT DETECTED Final   Streptococcus pyogenes NOT DETECTED NOT DETECTED Final   Acinetobacter baumannii NOT DETECTED NOT DETECTED Final   Enterobacteriaceae species NOT DETECTED NOT DETECTED Final   Enterobacter cloacae complex NOT DETECTED NOT DETECTED Final   Escherichia coli NOT DETECTED NOT DETECTED Final   Klebsiella oxytoca NOT DETECTED NOT DETECTED Final   Klebsiella pneumoniae NOT DETECTED NOT DETECTED Final   Proteus species NOT DETECTED NOT DETECTED Final   Serratia marcescens NOT DETECTED NOT DETECTED Final   Haemophilus influenzae NOT DETECTED NOT DETECTED Final   Neisseria meningitidis NOT DETECTED NOT DETECTED Final   Pseudomonas aeruginosa NOT DETECTED NOT DETECTED Final   Candida albicans NOT DETECTED NOT DETECTED Final   Candida glabrata NOT DETECTED NOT DETECTED Final   Candida krusei NOT DETECTED NOT DETECTED Final   Candida parapsilosis NOT DETECTED NOT DETECTED Final   Candida tropicalis NOT DETECTED NOT DETECTED Final    Comment: Performed at [facility], [address] [place], [place] [address]  Novel Coronavirus,NAA,(SEND-OUT TO REF LAB - TAT 24-48 hrs); Hosp Order     Status: None   Collection Time: 08/28/18  5:41 PM   Specimen: Nasopharyngeal Swab;  Respiratory  Result Value Ref Range Status   SARS-CoV-2, NAA NOT DETECTED NOT DETECTED Final    Comment: (NOTE) This test was developed and its performance characteristics determined by [employer]. This test has not been FDA cleared or approved. This test has been authorized by FDA under an Emergency Use Authorization (EUA). This test is only authorized for the duration of time the declaration that circumstances exist justifying the authorization of the emergency use of in vitro diagnostic tests for detection of SARS-CoV-2 virus and/or diagnosis of COVID-19 infection under section 564(b)(1) of the Act, 21 U.S.C. 791TAV-6(P)(7), unless the authorization is terminated or revoked sooner. When diagnostic testing is negative, the possibility of a false negative result should be considered in the context of a patient's recent exposures and the presence of clinical signs and symptoms consistent with COVID-19. An individual without symptoms of COVID-19 and who is not shedding SARS-CoV-2 virus would expect to have a negative (not detected) result in this assay. Performed  At: [facility] [place], [place] [id number] Rush Farmer MD [id number]    Coronavirus Source NASOPHARYNGEAL  Final    Comment: Performed at [place] [address] [place], [place] [address]  Culture, blood (routine x 2)     Status: None (Preliminary result)   Collection Time: 08/30/18  3:17 PM   Specimen: BLOOD  Result Value Ref Range Status   Specimen Description   Final    BLOOD RIGHT ANTECUBITAL Performed at [facility] [address] [place], [place] [address]    Special Requests   Final    BOTTLES DRAWN AEROBIC ONLY Blood Culture adequate volume Performed at [facility] [address], [place], [place] [address]    Culture   Final    NO GROWTH < 24 HOURS Performed at [facility] [address] [place], [place] [address]    Report Status PENDING  Incomplete  Culture, blood (routine x 2)     Status: None (Preliminary result)   Collection Time: 08/30/18  3:17 PM   Specimen: BLOOD  Result Value Ref Range Status   Specimen Description   Final    BLOOD RIGHT ANTECUBITAL Performed at [facility] [address] [place], [place] [address]    Special Requests   Final    BOTTLES DRAWN AEROBIC ONLY Blood Culture adequate volume Performed at [place] [address] [place], [place] [address]    Culture   Final    NO GROWTH < 24 HOURS Performed at [facility] [address], [place],  [address]    Report Status PENDING  Incomplete   OPAT ORDERS:  Diagnosis: recurrent LT breast cellulitis/abscess  Culture Result: MS-CoNS on blood  Allergies  Allergen Reactions  . Lisinopril Cough  . Tetanus Toxoid Adsorbed Swelling    Arm swelling    Discharge antibiotics: Rocephin 2 gm IV daily  Duration: 3 weeks End Date: 09/19/2018  [facility] Care and Maintenance Per Protocol __ Please pull PIC at completion of IV antibiotics _X_ Please leave PIC in place until doctor has seen patient or been notified  Labs weekly while on IV antibiotics: _X_ CBC with differential _X_ BMP __ BMP TWICE WEEKLY** __ CMP __ CRP __ ESR __ Vancomycin trough  Fax weekly labs to [phone]  Clinic Follow Up Appt: 09/18/2018 at 9:30 AM  @ [facility] with Dr. Prince Rome  Impression/Plan: Pt is a 73 y/o AAF withh/o AVR and h/o stage IB LT breast CA, s/p lumpectomy in 2018 and adjuvant chemotherapy admitted with recurrent LT breast cellulitis vs. Abscess and possible CoNS BSI.  1. Recurrent LT breast cellulitis - changed vanc to rocephin given blood cx results. General surgeon wishes to defer intervention at this time despite reidual fluid collection noted 2 years post-operatively from her lumpectomy. She denies any recent direct or blunt trauma to her  breast over the past month but does admit to anxiety re: covid-19 pandemic and self-quarantining. She admits she may have scratched to her former lumpectomy incision site prior to the onset of sxs. Concern she may truly have a LT breast abscess as streaking erythema overlying her former lumpectomy site would be more suspicious for this than simple residual seroma. Given robust response to rocephin, h/o AVR, and relapse shortly after taking PO ABX, will plan to place PICC tomorrow and continue rocephin 2 gm IV daily for 3 week duration. Tentative ABX d/c date  is 09/19/2018. > 20 minutes of time spent on d/c planning alone today. See OPAT instructions and f/u as above.  2. 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 Will f/u repeat blood cxs x 2 to re-assess (thus far NGTD). Plan to place PICC tomorrow if repeat blood cxs are negative at 48 hrs.

## 2018-08-31 NOTE — Progress Notes (Signed)
[place]   [phone] [phone]   Clinic Follow up Note   Patient Care Team: [name], [name], MD as PCP - General (Family Medicine) End, Harrell Gave, MD as PCP - Cardiology (Cardiology) [name], MD as Consulting Physician (Hematology) Kyung Rudd, MD as Consulting Physician (Radiation Oncology) Rolm Bookbinder, MD as Consulting Physician (General Surgery) Delice Bison, Charlestine Massed, NP as Nurse Practitioner (Hematology and Oncology)  Date of Service:  09/08/2018  CHIEF COMPLAINT: F/u of left breast cancer and left breast cellulitis   SUMMARY OF ONCOLOGIC HISTORY: Oncology History Overview Note  Cancer Staging Malignant neoplasm of upper-outer quadrant of left breast in female, estrogen receptor positive ([place]) Staging form: Breast, AJCC 8th Edition - Clinical stage from 12/13/2016: Stage IB (cT2, cN0, cM0, G2, ER: Positive, PR: Positive, HER2: Negative) - Signed by [name], MD on 12/21/2016 - Pathologic stage from 01/14/2017: Stage IA (pT2, pN0, cM0, G1, ER: Positive, PR: Positive, HER2: Negative, Oncotype DX score: 4) - Signed by [name], MD on 04/17/2017     Malignant neoplasm of upper-outer quadrant of left breast in female, estrogen receptor positive ([place])  12/06/2016 Mammogram   Diagnostic Mammogram 12/06/16 IMPRESSION:  Suspicious mass in the left breast at 3 o'clock 2 cm from the nipple measuring 1.9 x 1.1 x 2.2 cm. RECOMMENDATION: Ultrasound-guided core biopsy of the mass in the 3 o'clock region of the left breast is recommended. The biopsy will be scheduled at the patient's convenience.   12/13/2016 Initial Biopsy   Diagnosis 12/13/16 Breast, left, needle core biopsy, 3:00 o'clock, 2cmfn - INVASIVE DUCTAL CARCINOMA - SEE COMMENT   12/16/2016 Initial Diagnosis   Malignant neoplasm of upper-outer quadrant of left breast in female, estrogen receptor positive ([place])   12/17/2016 Receptors her2   Estrogen Receptor: 100%,  POSITIVE, STRONG STAINING INTENSITY Progesterone Receptor: 100%, POSITIVE, STRONG STAINING INTENSITY Proliferation Marker Ki67: 30% HER2 - NEGATIVE    01/14/2017 Surgery   Left breast lumpectomy with Dr. Donne Hazel   01/14/2017 Pathology Results   Diagnosis 01/14/17 1. Breast, lumpectomy, Left - INVASIVE DUCTAL CARCINOMA, GRADE I/III, SPANNING 2.1 CM. - DUCTAL CARCINOMA IN SITU, LOW GRADE. - INVASIVE CARCINOMA IS BROADLY PRESENT AT THE INFERIOR MARGIN OF SPECIMEN 1. - DUCTAL CARCINOMA IN SITU IS FOCALLY PRESENT AT THE INFERIOR MARGIN OF SPECIMEN 1 AND BROADLY LESS THAN 0.1 CM TO THE LATERAL MARGIN OF SPECIMEN 1. - SEE ONCOLOGY TABLE BELOW. 2. Breast, excision, Additional medial margin left - DUCTAL CARCINOMA IN SITU, LOW GRADE. - DUCTAL CARCINOMA IS FOCALLY LESS THAN 0.1 CM TO THE NEW MARGIN OF SPECIMEN 2. 3. Breast, excision, Additional lateral margin left - DUCTAL CARCINOMA IN SITU, LOW GRADE. - DUCTAL CARCINOMA IN SITU IS GREATER THAN 0.2 CM TO ALL MARGINS. 4. Breast, excision, Additional superior margin left - DUCTAL CARCINOMA IN SITU, LOW GRADE. - DUCTAL CARCINOMA IN SITU IS BROADLY LESS THAN 0.1 CM TO THE NEW MARGIN OF SPECIMEN 4. 5. Lymph node, sentinel, biopsy, Left axillary - THERE IS NO EVIDENCE OF CARCINOMA IN 1 OF 1 LYMPH NODE (0/1). 6. Breast, excision, Additional inferior margin left - DUCTAL CARCINOMA IN SITU, LOW GRADE. - DUCTAL CARCINOMA IN SITU IS GREATER THAN 0.2 CM TO ALL MARGINS.    01/14/2017 Oncotype testing   Her oncotype recurrence score is 4 and her distance recurrent on Tamoxifen alone is 3%.   03/08/2017 - 04/05/2017 Radiation Therapy   RT with Dr. Lisbeth Renshaw    06/2017 -  Anti-estrogen oral therapy   Tamoxifen  daily   12/15/2017 Mammogram   12/15/2017 Mammogram IMPRESSION: New lumpectomy site left breast. No mammographic evidence of malignancy in either breast.      CURRENT THERAPY:  Adjuvant Tamoxifen 20 mg daily started on 04/20/17  INTERVAL  HISTORY:  Emberleigh Reily is here for a follow up left breast cancer. She presents to the clinic alone. She notes she is doing better since her discharge. She notes she has home health nurse come see her. She get IV antibiotics. She has PICC line in place. She will complete on 09/19/18. She plans to f/u with ID next week. She notes she still feels weak. She uses Walker at home. She plans to start PT soon.    REVIEW OF SYSTEMS:   Constitutional: Denies fevers, chills or abnormal weight loss Eyes: Denies blurriness of vision Ears, nose, mouth, throat, and face: Denies mucositis or sore throat Respiratory: Denies cough, dyspnea or wheezes Cardiovascular: Denies palpitation, chest discomfort or lower extremity swelling Gastrointestinal:  Denies nausea, heartburn or change in bowel habits Skin: Denies abnormal skin rashes MSK: (+) Body weakness, ambulates with walker Lymphatics: Denies new lymphadenopathy or easy bruising Neurological:Denies numbness, tingling or new weaknesses Behavioral/Psych: Mood is stable, no new changes  All other systems were reviewed with the patient and are negative.  MEDICAL HISTORY:  Past Medical History:  Diagnosis Date  . Allergy   . Anxiety   . Aortic stenosis    Status post bioprosthetic AVR  . Arthritis    DJD  . Asthma   . Breast cancer ([place]) 12/13/2016   Left breast  . Chronic systolic CHF (congestive heart failure) ([place])    EF 30% 04/2014  . COPD (chronic obstructive pulmonary disease) ([place])   . Coronary artery disease    per pt, had LHC prior to AVR in [place] that did not show any blockages; no stents/bypass  . Gastritis   . Hyperlipidemia   . Hypertension   . Hyperthyroidism   . Multiple thyroid nodules   . Osteoporosis   . Paroxysmal atrial fibrillation ([facility])   . Pelvis fracture ([place]) 08/19/2015   MULTIPLE   . Personal history of radiation therapy 2018  . Spontaneous pneumothorax 11/28/2015   left   . Stroke ([place]) 2000    rt hand weak    SURGICAL HISTORY: Past Surgical History:  Procedure Laterality Date  . BREAST LUMPECTOMY Left 12/2016  . BREAST LUMPECTOMY WITH RADIOACTIVE SEED AND SENTINEL LYMPH NODE BIOPSY Left 01/14/2017   Procedure: LEFT BREAST LUMPECTOMY WITH RADIOACTIVE SEED AND SENTINEL LYMPH NODE BIOPSY;  Surgeon: Rolm Bookbinder, MD;  Location: [facility];  Service: General;  Laterality: Left;  . CHEST TUBE INSERTION  10/2015  . INTRAMEDULLARY (IM) NAIL INTERTROCHANTERIC Left 12/10/2015   Procedure: INTRAMEDULLARY (IM) NAIL INTERTROCHANTRIC;  Surgeon: Leandrew Koyanagi, MD;  Location: [facility];  Service: Orthopedics;  Laterality: Left;  . PLEURADESIS Left 12/03/2015   Procedure: PLEURADESIS;  Surgeon: Melrose Nakayama, MD;  Location: [facility];  Service: Thoracic;  Laterality: Left;  . RESECTION OF APICAL BLEB Left 12/03/2015   Procedure: BLEBECTOMY;  Surgeon: Melrose Nakayama, MD;  Location: [facility];  Service: Thoracic;  Laterality: Left;  . THORACOSCOPY  12/03/2015  . VALVE REPLACEMENT    . VIDEO ASSISTED THORACOSCOPY Left 12/03/2015   Procedure: VIDEO ASSISTED THORACOSCOPY;  Surgeon: Melrose Nakayama, MD;  Location: [facility];  Service: Thoracic;  Laterality: Left;    I have reviewed the social history and family history with the patient and  they are unchanged from previous note.  ALLERGIES:  is allergic to lisinopril and tetanus toxoid adsorbed.  MEDICATIONS:  Current Outpatient Medications  Medication Sig Dispense Refill  . acetaminophen (TYLENOL) 500 MG tablet Take 1,000 mg by mouth daily as needed (PAIN).    Marland Kitchen albuterol (PROVENTIL HFA;VENTOLIN HFA) 108 (90 Base) MCG/ACT inhaler Inhale 2 puffs into the lungs every 6 (six) hours as needed for wheezing or shortness of breath.     Marland Kitchen alendronate (FOSAMAX) 70 MG tablet TAKE  1 TABLET BY MOUTH EVERY SATURDAY WITH A FULL GLASS OF WATER ON A EMPTY STOMACH (Patient taking differently: Take 70 mg by mouth once a week. ) 4 tablet 0  . aspirin EC 81 MG  tablet Take 81 mg daily by mouth.    . budesonide-formoterol (SYMBICORT) 160-4.5 MCG/ACT inhaler Inhale 2 puffs into the lungs 2 (two) times daily. 1 Inhaler 0  . carvedilol (COREG) 25 MG tablet Take 1 tablet by mouth twice daily 180 tablet 1  . cefTRIAXone (ROCEPHIN) IVPB Inject 2 g into the vein daily for 18 days. Indication:  Breast cellulitis and abscess  Last Day of Therapy:  09/19/2018 Labs - Once weekly:  CBC/D and BMP 18 Units 0  . Cholecalciferol (VITAMIN D3) 400 units tablet Take 1 tablet (400 Units total) by mouth daily. 30 tablet 0  . diclofenac sodium (VOLTAREN) 1 % GEL Apply 4 g topically 4 (four) times daily. 500 g 3  . diphenhydramine-acetaminophen (TYLENOL PM) 25-500 MG TABS tablet Take 2 tablets by mouth at bedtime.     . furosemide (LASIX) 20 MG tablet Take 1 tablet (20 mg total) by mouth daily. 90 tablet 2  . losartan (COZAAR) 50 MG tablet Take 1 tablet by mouth once daily 90 tablet 1  . methimazole (TAPAZOLE) 5 MG tablet Take 3 tablets (15 mg total) by mouth every evening. 90 tablet 2  . rosuvastatin (CRESTOR) 40 MG tablet Take 1 tablet (40 mg total) by mouth daily. 90 tablet 3  . senna-docusate (SENOKOT-S) 8.6-50 MG tablet Take 2 tablets at bedtime by mouth.     . tamoxifen (NOLVADEX) 20 MG tablet Take 1 tablet by mouth once daily 90 tablet 0  . traMADol (ULTRAM) 50 MG tablet Take 1 tablet (50 mg total) by mouth 3 (three) times daily. 90 tablet 5  . valACYclovir (VALTREX) 500 MG tablet Take 1 tablet (500 mg total) by mouth 2 (two) times daily. For 3 days when outbreaks. (Patient taking differently: Take 500 mg by mouth 2 (two) times daily as needed (outbreaks). For 3 days when outbreaks.) 24 tablet 1  . warfarin (COUMADIN) 1 MG tablet 1 tab wednesdays (Patient taking differently: Take 1 mg by mouth once a week. Take 1 tablet (1 mg) Only on Wednesday) 20 tablet 2  . warfarin (COUMADIN) 3 MG tablet TAKE AS DIRECTED BY  ANTICOAGULATION  CLINIC (Patient taking differently: Take 3  mg by mouth as directed. Take 1 tablet (3 mg) daily except on Wednesday. TAKE AS DIRECTED BY  ANTICOAGULATION  CLINIC) 90 tablet 1   No current facility-administered medications for this visit.     PHYSICAL EXAMINATION: ECOG PERFORMANCE STATUS: 3 - Symptomatic, >50% confined to bed  Vitals:   09/08/18 1022  BP: (!) 155/87  Pulse: 73  Resp: 20  Temp: 98.3 F (36.8 C)  SpO2: 100%   Filed Weights   09/08/18 1022  Weight: 251 lb 11.2 oz (114.2 kg)    GENERAL:alert, no distress and comfortable SKIN: skin  color, texture, turgor are normal, no rashes or significant lesions EYES: normal, Conjunctiva are pink and non-injected, sclera clear  NECK: supple, thyroid normal size, non-tender, without nodularity LYMPH:  no palpable lymphadenopathy in the cervical, axillary  LUNGS: clear to auscultation and percussion with normal breathing effort HEART: regular rate & rhythm and no murmurs and no lower extremity edema ABDOMEN:abdomen soft, non-tender and normal bowel sounds Musculoskeletal:no cyanosis of digits and no clubbing  NEURO: alert & oriented x 3 with fluent speech, no focal motor/sensory deficits BREAST: (+) Diffuse skin hyperpigmentation of left lateral breast (+) No palpable mass or adenopathy b/l   LABORATORY DATA:  I have reviewed the data as listed CBC Latest Ref Rng & Units 09/08/2018 09/01/2018 08/30/2018  WBC 4.0 - 10.5 K/uL 4.7 5.0 7.5  Hemoglobin 12.0 - 15.0 g/dL 12.0 10.5(L) 11.0(L)  Hematocrit 36.0 - 46.0 % 37.9 33.5(L) 35.0(L)  Platelets 150 - 400 K/uL 102(L) 81(L) 74(L)     CMP Latest Ref Rng & Units 09/08/2018 09/01/2018 08/30/2018  Glucose 70 - 99 mg/dL 92 94 87  BUN 8 - 23 mg/dL '12 15 13  ' Creatinine 0.44 - 1.00 mg/dL 1.11(H) 1.13(H) 0.93  Sodium 135 - 145 mmol/L 140 138 139  Potassium 3.5 - 5.1 mmol/L 4.3 3.7 3.9  Chloride 98 - 111 mmol/L 110 110 112(H)  CO2 22 - 32 mmol/L 23 23 19(L)  Calcium 8.9 - 10.3 mg/dL 9.2 8.5(L) 8.4(L)  Total Protein 6.5 - 8.1 g/dL 6.8 -  -  Total Bilirubin 0.3 - 1.2 mg/dL 0.3 - -  Alkaline Phos 38 - 126 U/L 55 - -  AST 15 - 41 U/L 13(L) - -  ALT 0 - 44 U/L 12 - -      RADIOGRAPHIC STUDIES: I have personally reviewed the radiological images as listed and agreed with the findings in the report. No results found.   ASSESSMENT & PLAN:  Danielle [name] is a 73 y.o. female with   1. Malignant neoplasm of upper-outer quadrant of left breast, Stage IB, c(T2,N0,M0 ), ER/PR: POSITIVE, HER2: NEGATIVE, Grade II -She was diagnosed in 11/2016. She is s/p left breast lumpectomy and adjuvant radiation.  -the Oncotype Dx result was previously reviewed with her in details. She has low risk based on the recurrence score, which predicts 10 year distant recurrence after 5 years of tamoxifen 3%. There is no benefit of adjuvant chemotherapy for low risk disease. -She started Tamoxifen in 06/2017. Tolerating well, will continue for 5 to 10 years.Due to her severe arthritis, she may not be able to tolerate aromatase inhibitor. -I discussed there is a mild drug interaction between Tamoxifen and Coumadin given Tamoxifen can increase her coumadin level. She has been on both for over a year now without issue. She follows her PCP for PT/INR check monthly. She is fine to continue and she will continue to follow up with her PCP.  -She was recently hospitalized for recurrent left breast cellulitis. She will complete outpatient IV antibiotics on 09/19/18. I plan to obtain Mammogram and DEXA after completion. She is agreeable.  -She is clinically improving. Will start PT soon. Labs reviewed, CBC and CMP WNL except PLT 102, Cr 1.11, Albumin 3.4, AST 13. There is no clinical concern for recurrence.  -Continue Surveillance. Continue Tamoxifen  -F/u in 3-4 months    2. 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 one of her 4 initial blood culture was positive for methicillin sensitive coagulase-negative staph,  possible skin contamination -axillary [place] and CT abdomen results, she has a 4.2cm seroma in left breast -She underwent a couple of IV antibiotic changes as noted above i.e. IV Unasyn >vancomycin and finally to IV ceftriaxone. -Repeated blood culture on 08/30/18 negative  -She was discharged with IV ceftriaxone 2 g IV daily x3 weeks with tentative antibiotic, to complete on 09/19/2018.  She has PICC line in place. She has home health assistance.  -cellulitis resolved now. Her skin hyperpigmentation is still present on exam today (09/08/18). She notes to being weak and ambulates with walker at home. She will start PT soon.   3. Osteoporosis, Arthritis  -Underwent Left Hip surgery with rod placement in 2017, ambulates with walker -On Fosamax -Next DEXA in 08/2018.  -Arthritis is stable, she will continue to f/u with pain specialist.   4. CAD, Afib, CHF, Aortic Stenosis, H/o Stroke -On Coreg, Coumadin -clinically stable -follow-up with her primary care physician  5. Insomnia  -I recommend OTC benadryl or melatonin  -I also reviewed sleep hygiene with her.   6. 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  -At this mild level, there is no indication for treatment. Will monitor.   PLAN:  -left breast cellulitis resolved  -Continue Tamoxifen  -Labs reviewed with her.  -left diagnostic mammogram and DEXA in 2 weeks  -Lab and f/u in 3-4 months    No problem-specific Assessment & Plan notes found for this encounter.   Orders Placed This Encounter  Procedures  . MM DIAG BREAST TOMO UNI LEFT    Standing Status:   Future    Standing Expiration Date:   09/08/2019    Order Specific Question:   Reason for Exam (SYMPTOM  OR DIAGNOSIS REQUIRED)    Answer:   screening, recent recurrent left breast cellulitis, rule out malignancy    Order Specific Question:   Preferred imaging location?    Answer:    [facility]  . DG Bone Density    Standing Status:   Future    Standing Expiration Date:   09/08/2019    Order Specific Question:   Reason for Exam (SYMPTOM  OR DIAGNOSIS REQUIRED)    Answer:   screening    Order Specific Question:   Preferred imaging location?    Answer:   [facility]   All questions were answered. The patient knows to call the clinic with any problems, questions or concerns. No barriers to learning was detected. I spent 15 minutes counseling the patient face to face. The total time spent in the appointment was 20 minutes and more than 50% was on counseling and review of test results     [name], MD 09/08/2018   I, Joslyn Devon, am acting as scribe for [name], MD.   I have reviewed the above documentation for accuracy and completeness, and I agree with the above.

## 2018-08-31 NOTE — Care Management Important Message (Signed)
Important Message  Patient Details IM Letter given to Rhea Pink RN to present to the Patient Name: Danielle [name] MRN: [id number] Date of Birth: January 30, 1946   Medicare Important Message Given:  Yes     Kerin Salen 08/31/2018, 11:06 AM

## 2018-08-31 NOTE — Progress Notes (Signed)
PHARMACY CONSULT NOTE FOR:  OUTPATIENT  PARENTERAL ANTIBIOTIC THERAPY (OPAT)  Indication: Breast cellulitis and abscess Regimen: Ceftriaxone 2gm IV q24h End date: 09/19/2018  IV antibiotic discharge orders are pended. To discharging provider:  please sign these orders via discharge navigator,  Select New Orders & click on the button choice - Manage This Unsigned Work.     Thank you for allowing pharmacy to be a part of this patient's care.  Doreene Eland, PharmD, BCPS.   Work Cell: [phone] 08/31/2018 4:42 PM

## 2018-08-31 NOTE — Progress Notes (Addendum)
PROGRESS NOTE   Danielle [name]  STM:[id number]    DOB: 07-14-1945    DOA: 08/28/2018  PCP: [name], [name], MD   I have briefly reviewed patients previous medical records in [facility].   Brief Narrative:  73 year old female with PMH of aortic stenosis s/p bioprosthetic AVR, asthma, left breast cancer status post lumpectomy and radiation, COPD, CAD, HTN, hypothyroid developed sudden onset of redness, warmth but no significant pain of her left breast on 08/07/2018, seen by her oncologist and her cellulitis completely resolved after 7 days course of doxycycline, developed recurrence of similar symptoms on 6/29 prompting hospital admission.CCS consulted and no drainable abscess.  ID consulted.   Assessment & Plan:   Principal Problem:   Cellulitis of left breast Active Problems:   Essential hypertension   Asthma   Paroxysmal atrial fibrillation (HCC)   Chronic systolic (congestive) heart failure (HCC)   Hyperthyroidism   Morbid obesity due to excess calories (HCC)   NICM (nonischemic cardiomyopathy) ([place])   Malignant neoplasm of upper-outer quadrant of left breast in female, estrogen receptor positive (HCC)   CKD (chronic kidney disease), stage III (HCC)   Cellulitis of left abdominal wall   Bacteremia due to Gram-positive bacteria   Abscess   Recurrent left breast cellulitis  ID, CCS and oncology input appreciated.  Although on imaging there was some concern for fluid collection, CCS does not suspect any abscess and have signed off 7/1.  ID concerned for MRSA cellulitis given recent prompt improvement on doxycycline and hence have changed IV Unasyn to vancomycin and finally to IV ceftriaxone.  Slowly improving.  At baseline patient had chronic left breast swelling since lumpectomy and radiation therapy.  Await ID follow-up regarding duration of IV antibiotics, possible transition to oral antibiotics and timing of DC home.  Uncomplicated vulvovaginal  candidiasis  S/p Oral Diflucan 150 mg x 1 dose.  Improved.  1/4 blood cultures positive for methicillin sensitive coagulase-negative staph  Possible contaminant but could be related to cellulitis as well.  ID on board and currently on IV vancomycin.  Surveillance blood cultures from 08/30/2018 are negative to date.  Paroxysmal atrial fibrillation  Currently in sinus rhythm.  Continue carvedilol for rate control and Coumadin per pharmacy.  INR therapeutic at 2.2.  Malignant neoplasm of the upper outer quadrant of the left breast stage Ib ER/PR positive, HER-2 negative, grade 2 oncotype IV  Status post lumpectomy 12/2016 with adjuvant radiation January to February 2019.  Patient began tamoxifen in May 2019 and currently tolerating it.  Continue tamoxifen.  Oncology following.  Coronary artery disease/aortic stenosis/history of chronic systolic CHF due to nonischemic cardiomyopathy  Currently stable.  Continue home regimen of Coreg, resume Lasix, Cozaar, aspirin, Crestor, Coumadin.  Chronic pain  Continue home pain regimen.  Continue Neurontin, Ultram.  Controlled.  Chronic kidney disease stage II  Stable.  Follow.  Essential hypertension  Controlled.  Continue Coreg, Lasix, Cozaar.  History of recurrent genital herpes  Continue Valtrex.  Hyperthyroidism  Continue methimazole.  Outpatient follow-up with PCP.  Clinically euthyroid.  Thrombocytopenia, mild chronic  Stable.  As per oncology ITP versus aortic stenosis related.   DVT prophylaxis: Coumadin anticoagulation, INR 2.2. Code Status: DNR Family Communication: None at bedside Disposition: DC home pending clinical improvement and ID clearance, hopefully in the next 1 to 2 days.   Consultants:  ID General surgery Medical oncology  Procedures:  None  Antimicrobials:   IV Unasyn 08/28/2018>>>> 08/29/2018  IV vancomycin 08/29/2018 >7/1  IV  ceftriaxone 7/1 >   Subjective: Patient interviewed and  examined with female RN in room.  States that her left breast swelling is slightly better and still has warmth but no tenderness.  Reports chronic left breast enlargement since prior lumpectomy and radiation treatment.  Vaginal pruritus better.  No discharge.  ROS: Above, otherwise negative.  Objective:  Vitals:   08/31/18 0506 08/31/18 0507 08/31/18 0806 08/31/18 0808  BP: 109/63 109/63    Pulse: 81 68    Resp: 20 20    Temp: 98.3 F (36.8 C) 98.3 F (36.8 C)    TempSrc: Oral Oral    SpO2: 97% 96% 97% 97%  Weight:  114.6 kg    Height:        Examination:  General exam: Pleasant elderly female, moderately built and obese sitting up comfortably in chair. Respiratory system: Clear to auscultation. Respiratory effort normal.  Stable. Cardiovascular system: S1 & S2 heard, RRR. No JVD, murmurs, rubs, gallops or clicks. No pedal edema.  Stable. Gastrointestinal system: Abdomen is nondistended, soft and nontender. No organomegaly or masses felt. Normal bowel sounds heard. Central nervous system: Alert and oriented. No focal neurological deficits. Extremities: Symmetric 5 x 5 power. Skin: Midline CABG scar.  Left breast with diffuse induration, erythema has now changed to darkish discoloration, still has mild increased warmth.  No fluctuance or crepitus.  No drainage. Psychiatry: Judgement and insight appear normal. Mood & affect appropriate.     Data Reviewed: I have personally reviewed following labs and imaging studies  CBC: Recent Labs  Lab 08/28/18 1725 08/29/18 0442 08/30/18 0427  WBC 10.9* 8.4 7.5  NEUTROABS 9.0*  --  5.1  HGB 12.2 10.7* 11.0*  HCT 40.3 34.1* 35.0*  MCV 99.8 98.6 98.9  PLT 82* 73* 74*   Basic Metabolic Panel: Recent Labs  Lab 08/28/18 1725 08/29/18 0442 08/30/18 0427  NA 140 140 139  K 3.9 3.7 3.9  CL 111 112* 112*  CO2 21* 21* 19*  GLUCOSE 90 92 87  BUN '13 15 13  ' CREATININE 1.04* 1.18* 0.93  CALCIUM 9.3 8.4* 8.4*  MG 2.1  --   --     Liver Function Tests: Recent Labs  Lab 08/29/18 0442  AST 10*  ALT 10  ALKPHOS 36*  BILITOT 0.7  PROT 5.8*  ALBUMIN 3.0*   Coagulation Profile: Recent Labs  Lab 08/28/18 1908 08/29/18 0442 08/30/18 0427 08/31/18 0740  INR 1.9* 2.0* 2.1* 2.2*   Cardiac Enzymes: No results for input(s): CKTOTAL, CKMB, CKMBINDEX, TROPONINI in the last 168 hours. HbA1C: Recent Labs    08/28/18 1725  HGBA1C 5.0   CBG: No results for input(s): GLUCAP in the last 168 hours.  Recent Results (from the past 240 hour(s))  Blood Cultures x 2 sites     Status: None (Preliminary result)   Collection Time: 08/28/18  5:25 PM   Specimen: BLOOD RIGHT HAND  Result Value Ref Range Status   Specimen Description   Final    BLOOD RIGHT HAND Performed at [facility] [address] [place], [place] [address]    Special Requests   Final    BOTTLES DRAWN AEROBIC ONLY Blood Culture adequate volume Performed at [facility] [address] [place], [place] [address]    Culture   Final    NO GROWTH 3 DAYS Performed at [facility], [place] [address] [place], [place] [address]    Report Status PENDING  Incomplete  Blood  Cultures x 2 sites     Status: Abnormal   Collection Time: 08/28/18  5:25 PM   Specimen: BLOOD RIGHT HAND  Result Value Ref Range Status   Specimen Description   Final    BLOOD RIGHT HAND Performed at [facility] [address], [place], [place] [address]    Special Requests   Final    BOTTLES DRAWN AEROBIC ONLY Blood Culture results may not be optimal due to an inadequate volume of blood received in culture bottles Performed at [facility] [address], [place], [place] [address]    Culture  Setup Time   Final    GRAM POSITIVE COCCI AEROBIC BOTTLE ONLY CRITICAL RESULT CALLED TO, READ BACK BY AND VERIFIED WITH: Shelda Jakes PharmD 14:40 08/29/18 ([email or web address])    Culture (A)  Final    STAPHYLOCOCCUS  SPECIES (COAGULASE NEGATIVE) THE SIGNIFICANCE OF ISOLATING THIS ORGANISM FROM A SINGLE SET OF BLOOD CULTURES WHEN MULTIPLE SETS ARE DRAWN IS UNCERTAIN. PLEASE NOTIFY THE MICROBIOLOGY DEPARTMENT WITHIN ONE WEEK IF SPECIATION AND SENSITIVITIES ARE REQUIRED. Performed at [facility], [place] [address], [place], [place] [address]    Report Status 08/30/2018 FINAL  Final  Blood Culture ID Panel (Reflexed)     Status: Abnormal   Collection Time: 08/28/18  5:25 PM  Result Value Ref Range Status   Enterococcus species NOT DETECTED NOT DETECTED Final   Listeria monocytogenes NOT DETECTED NOT DETECTED Final   Staphylococcus species DETECTED (A) NOT DETECTED Final    Comment: Methicillin (oxacillin) susceptible coagulase negative staphylococcus. Possible blood culture contaminant (unless isolated from more than one blood culture draw or clinical case suggests pathogenicity). No antibiotic treatment is indicated for blood  culture contaminants. CRITICAL RESULT CALLED TO, READ BACK BY AND VERIFIED WITH: Shelda Jakes PharmD 14:40 08/29/18 ([email or web address])    Staphylococcus aureus (BCID) NOT DETECTED NOT DETECTED Final   Methicillin resistance NOT DETECTED NOT DETECTED Final   Streptococcus species NOT DETECTED NOT DETECTED Final   Streptococcus agalactiae NOT DETECTED NOT DETECTED Final   Streptococcus pneumoniae NOT DETECTED NOT DETECTED Final   Streptococcus pyogenes NOT DETECTED NOT DETECTED Final   Acinetobacter baumannii NOT DETECTED NOT DETECTED Final   Enterobacteriaceae species NOT DETECTED NOT DETECTED Final   Enterobacter cloacae complex NOT DETECTED NOT DETECTED Final   Escherichia coli NOT DETECTED NOT DETECTED Final   Klebsiella oxytoca NOT DETECTED NOT DETECTED Final   Klebsiella pneumoniae NOT DETECTED NOT DETECTED Final   Proteus species NOT DETECTED NOT DETECTED Final   Serratia marcescens NOT DETECTED NOT DETECTED Final   Haemophilus influenzae NOT DETECTED NOT DETECTED Final   Neisseria  meningitidis NOT DETECTED NOT DETECTED Final   Pseudomonas aeruginosa NOT DETECTED NOT DETECTED Final   Candida albicans NOT DETECTED NOT DETECTED Final   Candida glabrata NOT DETECTED NOT DETECTED Final   Candida krusei NOT DETECTED NOT DETECTED Final   Candida parapsilosis NOT DETECTED NOT DETECTED Final   Candida tropicalis NOT DETECTED NOT DETECTED Final    Comment: Performed at [facility], [address], [place], [place] [address]  Novel Coronavirus,NAA,(SEND-OUT TO REF LAB - TAT 24-48 hrs); Hosp Order     Status: None   Collection Time: 08/28/18  5:41 PM   Specimen: Nasopharyngeal Swab; Respiratory  Result Value Ref Range Status   SARS-CoV-2, NAA NOT DETECTED NOT DETECTED Final    Comment: (NOTE) This test was developed and its performance characteristics determined by [employer]. This test  has not been FDA cleared or approved. This test has been authorized by FDA under an Emergency Use Authorization (EUA). This test is only authorized for the duration of time the declaration that circumstances exist justifying the authorization of the emergency use of in vitro diagnostic tests for detection of SARS-CoV-2 virus and/or diagnosis of COVID-19 infection under section 564(b)(1) of the Act, 21 U.S.C. 458KDX-8(P)(3), unless the authorization is terminated or revoked sooner. When diagnostic testing is negative, the possibility of a false negative result should be considered in the context of a patient's recent exposures and the presence of clinical signs and symptoms consistent with COVID-19. An individual without symptoms of COVID-19 and who is not shedding SARS-CoV-2 virus would expect to have a negative (not detected) result in this assay. Performed  At: [facility] [place], [place] [id number] Rush Farmer MD BH:[id number]    [place]  Final    Comment: Performed at [facility]  [address] [place], [place] [address]  Culture, blood (routine x 2)     Status: None (Preliminary result)   Collection Time: 08/30/18  3:17 PM   Specimen: BLOOD  Result Value Ref Range Status   Specimen Description   Final    BLOOD RIGHT ANTECUBITAL Performed at [facility] [address] [place], [place] [address]    Special Requests   Final    BOTTLES DRAWN AEROBIC ONLY Blood Culture adequate volume Performed at [facility] [address] [place], [place] [address]    Culture   Final    NO GROWTH < 24 HOURS Performed at [facility] [address] [place], [place] [address]    Report Status PENDING  Incomplete  Culture, blood (routine x 2)     Status: None (Preliminary result)   Collection Time: 08/30/18  3:17 PM   Specimen: BLOOD  Result Value Ref Range Status   Specimen Description   Final    BLOOD RIGHT ANTECUBITAL Performed at [facility] [address], [place], [place] [address]    Special Requests   Final    BOTTLES DRAWN AEROBIC ONLY Blood Culture adequate volume Performed at [facility] [address], [place], [place] [address]    Culture   Final    NO GROWTH < 24 HOURS Performed at [facility] [address], [place], [place] [address]    Report Status PENDING  Incomplete         Radiology Studies: No results found.      Scheduled Meds: . aspirin EC  81 mg Oral Daily  . carvedilol  25 mg Oral BID  . furosemide  20 mg Oral Daily  . gabapentin  600 mg Oral QHS  . losartan  50 mg Oral Daily  . methimazole  15 mg Oral QPM  . mometasone-formoterol  2 puff Inhalation BID  . rosuvastatin  40 mg Oral Daily  . senna-docusate  2 tablet Oral QHS  . tamoxifen  20 mg Oral Daily  . traMADol  50 mg Oral TID  . valACYclovir  500 mg Oral BID  . warfarin  3 mg Oral q1800  . Warfarin - Pharmacist Dosing Inpatient   Does not apply q1800   Continuous Infusions:  . cefTRIAXone (ROCEPHIN)  IV 2 g (08/30/18 1700)     LOS: 3 days     Vernell Leep, MD, FACP, [facility]. [facility]  To contact the  attending provider between 7A-7P or the covering provider during after hours 7P-7A, please log into the web site [email or web address] and access using universal [place] password for that web site. If you do not have the password, please call the hospital operator.  08/31/2018, 3:41 PM

## 2018-08-31 NOTE — Progress Notes (Signed)
[place] for warfarin Indication: atrial fibrillation  Allergies  Allergen Reactions  . Lisinopril Cough  . Tetanus Toxoid Adsorbed Swelling    Arm swelling   Patient Measurements: Height: 5\' 7"  (170.2 cm) Weight: 252 lb 10.4 oz (114.6 kg) IBW/kg (Calculated) : 61.6  Vital Signs: Temp: 98.3 F (36.8 C) (07/02 0507) Temp Source: Oral (07/02 0507) BP: 109/63 (07/02 0507) Pulse Rate: 68 (07/02 0507)  Labs: Recent Labs    08/28/18 1725 08/28/18 1908 08/29/18 0442 08/30/18 0427  HGB 12.2  --  10.7* 11.0*  HCT 40.3  --  34.1* 35.0*  PLT 82*  --  73* 74*  LABPROT  --  21.3* 22.0* 23.3*  INR  --  1.9* 2.0* 2.1*  CREATININE 1.04*  --  1.18* 0.93   Estimated Creatinine Clearance: 71.5 mL/min (by C-G formula based on SCr of 0.93 mg/dL).  Medical History: Past Medical History:  Diagnosis Date  . Allergy   . Anxiety   . Aortic stenosis    Status post bioprosthetic AVR  . Arthritis    DJD  . Asthma   . Breast cancer ([place]) 12/13/2016   Left breast  . Chronic systolic CHF (congestive heart failure) ([place])    EF 30% 04/2014  . COPD (chronic obstructive pulmonary disease) ([place])   . Coronary artery disease    per pt, had LHC prior to AVR in [place] that did not show any blockages; no stents/bypass  . Gastritis   . Hyperlipidemia   . Hypertension   . Hyperthyroidism   . Multiple thyroid nodules   . Osteoporosis   . Paroxysmal atrial fibrillation (HCC)   . Pelvis fracture ([place]) 08/19/2015   MULTIPLE   . Personal history of radiation therapy 2018  . Spontaneous pneumothorax 11/28/2015   left   . Stroke [facility]) 2000   rt hand weak   Assessment: Pt admitted with concern for left abdominal wall abscess and started on ampicillin/sulbactam. Pharmacy consulted to dose/monitor warfarin. Pt was taking PTA for atrial fibrillation.  PTA dose (confirmed with outpatient notes):  Warfarin 1 mg PO on Wednesday  Warfarin 3 mg PO all other  days of the week  Today, 08/31/2018 INR 2.2, INR stable on 3mg  daily  Goal of Therapy:  INR 2-3 Monitor platelets by anticoagulation protocol: Yes   Plan:   Warfarin 3 mg PO qday   INR MWF  Minda Ditto PharmD 08/31/2018 7:10 AM

## 2018-09-01 ENCOUNTER — Inpatient Hospital Stay: Payer: Self-pay

## 2018-09-01 LAB — CBC
HCT: 33.5 % — ABNORMAL LOW (ref 36.0–46.0)
Hemoglobin: 10.5 g/dL — ABNORMAL LOW (ref 12.0–15.0)
MCH: 30.7 pg (ref 26.0–34.0)
MCHC: 31.3 g/dL (ref 30.0–36.0)
MCV: 98 fL (ref 80.0–100.0)
Platelets: 81 10*3/uL — ABNORMAL LOW (ref 150–400)
RBC: 3.42 MIL/uL — ABNORMAL LOW (ref 3.87–5.11)
RDW: 14.1 % (ref 11.5–15.5)
WBC: 5 10*3/uL (ref 4.0–10.5)
nRBC: 0 % (ref 0.0–0.2)

## 2018-09-01 LAB — BASIC METABOLIC PANEL
Anion gap: 5 (ref 5–15)
BUN: 15 mg/dL (ref 8–23)
CO2: 23 mmol/L (ref 22–32)
Calcium: 8.5 mg/dL — ABNORMAL LOW (ref 8.9–10.3)
Chloride: 110 mmol/L (ref 98–111)
Creatinine, Ser: 1.13 mg/dL — ABNORMAL HIGH (ref 0.44–1.00)
GFR calc Af Amer: 56 mL/min — ABNORMAL LOW (ref >60)
GFR calc non Af Amer: 49 mL/min — ABNORMAL LOW (ref >60)
Glucose, Bld: 94 mg/dL (ref 70–99)
Potassium: 3.7 mmol/L (ref 3.5–5.1)
Sodium: 138 mmol/L (ref 135–145)

## 2018-09-01 LAB — PROTIME-INR
INR: 2.3 — ABNORMAL HIGH (ref 0.8–1.2)
Prothrombin Time: 25 seconds — ABNORMAL HIGH (ref 11.4–15.2)

## 2018-09-01 MED ORDER — SODIUM CHLORIDE 0.9% FLUSH: 10.0000 mL | Freq: Two times a day (BID) | INTRAVENOUS | Status: DC

## 2018-09-01 MED ORDER — CEFTRIAXONE IV (FOR PTA / DISCHARGE USE ONLY): 2.0000 g | INTRAVENOUS | 0 refills | Status: AC

## 2018-09-01 MED ORDER — SODIUM CHLORIDE 0.9% FLUSH: 10.0000 mL | INTRAVENOUS | Status: DC | PRN

## 2018-09-01 NOTE — TOC Initial Note (Signed)
Transition of Care [facility]) - Initial/Assessment Note    Patient Details  Name: Danielle [name] MRN: [id number] Date of Birth: Dec 11, 1945  [facility] CM/SW Contact:    Wende Neighbors, LCSW Phone Number: 09/01/2018, 11:48 AM  Clinical Narrative:    CSW spoke with Carolynn Sayers via phone in regards to setting up IV antibiotics at home. Pam set up [facility] to follow patient in the home. Pam stated that she will educated patient on how to administer IV antibiotics               Expected Discharge Plan: [place] Barriers to Discharge: Continued Medical Work up   Patient Goals and CMS Choice        Expected Discharge Plan and Services Expected Discharge Plan: [place] Choice: [place] arrangements for the past 2 months: Single Family Home Expected Discharge Date: (unknown)                         HH Arranged: RN [place] Agency: [place] Date [facility] Agency Contacted: 09/01/18 Time HH Agency Contacted: 68 Representative spoke with at [place]: cory  Prior Living Arrangements/Services Living arrangements for the past 2 months: [place] with:: Self   Do you feel safe going back to the place where you live?: Yes      Need for Family Participation in Patient Care: Yes (Comment) Care giver support system in place?: Yes (comment)   Criminal Activity/Legal Involvement Pertinent to Current Situation/Hospitalization: No - Comment as needed  Activities of Daily Living Home Assistive Devices/Equipment: Eyeglasses, Environmental consultant (specify type), Dentures (specify type)(upper denture, front wheeled walker) ADL Screening (condition at time of admission) Patient's cognitive ability adequate to safely complete daily activities?: Yes Is the patient deaf or have difficulty hearing?: No Does the patient have difficulty seeing, even when wearing glasses/contacts?: No Does the  patient have difficulty concentrating, remembering, or making decisions?: No Patient able to express need for assistance with ADLs?: Yes Does the patient have difficulty dressing or bathing?: No Independently performs ADLs?: Yes (appropriate for developmental age) Does the patient have difficulty walking or climbing stairs?: Yes Weakness of Legs: Both Weakness of Arms/Hands: None  Permission Sought/Granted Permission sought to share information with : Family Supports    Share Information with NAME: [place] granted to share info w Relationship: spouse  Permission granted to share info w Contact Information: [phone]  Emotional Assessment Appearance:: Appears stated age Attitude/Demeanor/Rapport: Unable to Assess Affect (typically observed): Unable to Assess Orientation: : Oriented to Self, Oriented to  Time, Oriented to Place, Oriented to Situation Alcohol / Substance Use: Not Applicable Psych Involvement: No (comment)  Admission diagnosis:  Breast Cellulitis  Patient Active Problem List   Diagnosis Date Noted  . Bacteremia due to Gram-positive bacteria 08/29/2018  . Abscess   . Cellulitis of left breast 08/28/2018  . Cellulitis of left abdominal wall 08/28/2018  . Encounter for routine gynecological examination 04/28/2018  . CKD (chronic kidney disease), stage III ([place]) 09/27/2017  . Recurrent genital herpes 04/13/2017  . Chronic pain disorder 01/25/2017  . Malignant neoplasm of upper-outer quadrant of left breast in female, estrogen receptor positive ([place]) 12/16/2016  . Chronic knee pain 12/07/2016  . Chronic bilateral low back pain without sciatica 12/07/2016  . Peripheral musculoskeletal gait disorder 12/07/2016  . Encounter for  therapeutic drug monitoring 06/16/2016  . Generalized osteoarthritis of multiple sites 05/04/2016  . Chronic anticoagulation 05/04/2016  . Stroke ([place]) 04/14/2016  . Aortic valve disease 04/10/2016  . History of  aortic valve replacement 04/10/2016  . NICM (nonischemic cardiomyopathy) ([place]) 04/10/2016  . Upper airway cough syndrome 03/14/2016  . Morbid obesity due to excess calories ([place]) 03/14/2016  . COPD GOLD 0  03/11/2016  . Thrombocytopenia ([place]) 12/12/2015  . Anemia 12/12/2015  . Vitamin D deficiency 12/12/2015  . Acute urinary retention 12/11/2015  . Osteoporosis 12/11/2015  . Hip fracture ([place]) 12/10/2015  . Aortic atherosclerosis ([place]) 12/10/2015  . Lung blebs ([place]) 12/03/2015  . Pelvic fracture ([place]) 08/19/2015  . Fall at home 08/19/2015  . Essential hypertension 08/19/2015  . Hyperlipidemia 08/19/2015  . Asthma 08/19/2015  . Paroxysmal atrial fibrillation ([place]) 08/19/2015  . Hyperthyroidism 08/19/2015  . Chronic systolic (congestive) heart failure ([place])   . 3-vessel CAD   . Aortic stenosis    PCP:  [name], [name], MD Pharmacy:   [facility]), [place] - [address] [place] [address] Phone: [phone] Fax: [phone]     Social Determinants of Health (SDOH) Interventions    Readmission Risk Interventions No flowsheet data found.

## 2018-09-01 NOTE — Progress Notes (Signed)
Peripherally Inserted Central Catheter/Midline Placement  The IV Nurse has discussed with the patient and/or persons authorized to consent for the patient, the purpose of this procedure and the potential benefits and risks involved with this procedure.  The benefits include less needle sticks, lab draws from the catheter, and the patient may be discharged home with the catheter. Risks include, but not limited to, infection, bleeding, blood clot (thrombus formation), and puncture of an artery; nerve damage and irregular heartbeat and possibility to perform a PICC exchange if needed/ordered by physician.  Alternatives to this procedure were also discussed.  Bard Power PICC patient education guide, fact sheet on infection prevention and patient information card has been provided to patient /or left at bedside.    PICC/Midline Placement Documentation  PICC Single Lumen 35/82/51 PICC Right Basilic 41 cm 0 cm (Active)  Indication for Insertion or Continuance of Line Home intravenous therapies (PICC only) 09/01/18 1233  Exposed Catheter (cm) 0 cm 09/01/18 1233  Site Assessment Clean;Dry;Intact 09/01/18 1233  Line Status Flushed;Saline locked;Blood return noted 09/01/18 1233  Dressing Type Transparent 09/01/18 1233  Dressing Status Clean;Dry;Intact;Antimicrobial disc in place 09/01/18 1233  Dressing Change Due 09/08/18 09/01/18 1233       [place] Payment 09/01/2018, 12:35 PM

## 2018-09-01 NOTE — Progress Notes (Signed)
Received order for PICC  

## 2018-09-01 NOTE — Progress Notes (Signed)
[place] for warfarin Indication: atrial fibrillation  Allergies  Allergen Reactions  . Lisinopril Cough  . Tetanus Toxoid Adsorbed Swelling    Arm swelling   Patient Measurements: Height: 5\' 7"  (170.2 cm) Weight: 253 lb 15.5 oz (115.2 kg) IBW/kg (Calculated) : 61.6  Vital Signs: Temp: 98 F (36.7 C) (07/03 0611) Temp Source: Oral (07/03 0611) BP: 134/74 (07/03 1130) Pulse Rate: 68 (07/03 1130)  Labs: Recent Labs    08/30/18 0427 08/31/18 0740 09/01/18 0436  HGB 11.0*  --  10.5*  HCT 35.0*  --  33.5*  PLT 74*  --  81*  LABPROT 23.3* 23.7* 25.0*  INR 2.1* 2.2* 2.3*  CREATININE 0.93  --  1.13*   Estimated Creatinine Clearance: 59 mL/min (A) (by C-G formula based on SCr of 1.13 mg/dL (H)).  Medical History: Past Medical History:  Diagnosis Date  . Allergy   . Anxiety   . Aortic stenosis    Status post bioprosthetic AVR  . Arthritis    DJD  . Asthma   . Breast cancer ([place]) 12/13/2016   Left breast  . Chronic systolic CHF (congestive heart failure) ([place])    EF 30% 04/2014  . COPD (chronic obstructive pulmonary disease) ([place])   . Coronary artery disease    per pt, had LHC prior to AVR in [place] that did not show any blockages; no stents/bypass  . Gastritis   . Hyperlipidemia   . Hypertension   . Hyperthyroidism   . Multiple thyroid nodules   . Osteoporosis   . Paroxysmal atrial fibrillation ([facility])   . Pelvis fracture ([place]) 08/19/2015   MULTIPLE   . Personal history of radiation therapy 2018  . Spontaneous pneumothorax 11/28/2015   left   . Stroke [facility]) 2000   rt hand weak   Assessment: Pt admitted with concern for left abdominal wall abscess and started on ampicillin/sulbactam. Pharmacy consulted to dose/monitor warfarin. Pt was taking PTA for atrial fibrillation.  PTA dose (confirmed with outpatient notes):  Warfarin 1 mg PO on Wednesday  Warfarin 3 mg PO all other days of the week  INR continue to be  stable on Coumadin 3mg  PO qday.   Goal of Therapy:  INR 2-3 Monitor platelets by anticoagulation protocol: Yes   Plan:   Warfarin 3 mg PO qday   INR MWF  Onnie Boer, PharmD, BCIDP, AAHIVP, CPP Infectious Disease Pharmacist 09/01/2018 11:49 AM

## 2018-09-01 NOTE — Discharge Instructions (Signed)

## 2018-09-01 NOTE — Progress Notes (Signed)
[facility] following for IV ABX.

## 2018-09-01 NOTE — Discharge Summary (Signed)
Physician Discharge Summary  Danielle [name] IOE:[id number] DOB: 07-04-1945  PCP: [name], Danielle G, MD  Admit date: 08/28/2018 Discharge date: 09/01/2018  Recommendations for Outpatient Follow-up:  1. Dr. Betty [name], PCP in 1 week.  Follow-up with repeat labs that will be drawn per the OPAT protocol. 2. Dr. Rayvon Char, ID on 09/18/2018 at 9:30 AM 3. Patient also follows with the Coumadin clinic.  Home Health: RN for home IV antibiotics and PICC line management. Equipment/Devices: Right basilic vein single-lumen PICC line placed 09/01/2018.  Discharge Condition: Improved and stable. CODE STATUS: DNR Diet recommendation: Heart healthy diet.  Discharge Diagnoses:  Principal Problem:   Cellulitis of left breast Active Problems:   Essential hypertension   Asthma   Paroxysmal atrial fibrillation (HCC)   Chronic systolic (congestive) heart failure (HCC)   Hyperthyroidism   Morbid obesity due to excess calories (HCC)   NICM (nonischemic cardiomyopathy) ([place])   Malignant neoplasm of upper-outer quadrant of left breast in female, estrogen receptor positive (HCC)   CKD (chronic kidney disease), stage III (HCC)   Cellulitis of left abdominal wall   Bacteremia due to Gram-positive bacteria   Abscess   Brief Summary: 73 year old female with PMH of aortic stenosis s/p bioprosthetic AVR, asthma, left breast cancer status post lumpectomy and radiation, COPD, CAD, HTN, hypothyroid developed sudden onset of redness, warmth but no significant pain of her left breast on 08/07/2018, seen by her oncologist and her cellulitis completely resolved after 7 days course of doxycycline, developed recurrence of similar symptoms on 6/29 prompting hospital admission.CCS consulted and no drainable abscess.  ID consulted.   Assessment & Plan:   Recurrent left breast cellulitis  ID, CCS and oncology input appreciated.  Although on imaging there was some concern for fluid collection, CCS does not  suspect any abscess and have signed off 7/1.  ID concerned for MRSA cellulitis given recent prompt improvement on doxycycline and hence have changed IV Unasyn to vancomycin and finally to IV ceftriaxone.  At baseline patient had chronic left breast swelling since lumpectomy and radiation therapy.  She underwent a couple of IV antibiotic changes as noted above i.e. IV Unasyn >vancomycin and finally to IV ceftriaxone.  As per ID signoff note 7/2, her cellulitis may have been precipitated by patient scratching her former lumpectomy incision site.  Given her robust response to ceftriaxone, history of AVR and relapsed shortly after taking oral antibiotics, ID recommended PICC line placement (done today) and IV ceftriaxone 2 g IV daily x3 weeks with tentative antibiotic DC date 09/19/2018.  Surveillance blood cultures negative after 2 days.  Uncomplicated vulvovaginal candidiasis  S/p Oral Diflucan 150 mg x 1 dose.    Resolved.  1/4 blood cultures positive for methicillin sensitive coagulase-negative staph  Possibly contaminant.  Surveillance blood cultures from 08/30/2018 remain negative to date.  Paroxysmal atrial fibrillation  Currently in sinus rhythm.  Continue carvedilol for rate control and Coumadin per pharmacy.  INR therapeutic at 2.3.  Patient follows with Coumadin clinic for dose adjustment.  Malignant neoplasm of the upper outer quadrant of the left breast stage Ib ER/PR positive, HER-2 negative, grade 2 oncotype IV  Status post lumpectomy 12/2016 with adjuvant radiation January to February 2019. Patient began tamoxifen in May 2019 and currently tolerating it. Continue tamoxifen. Oncology following and plan to see her in the clinic in 2 to 3 weeks time.  Coronary artery disease/aortic stenosis/history of chronic systolic CHF due tononischemic cardiomyopathy  Currently stable. Continue home regimen of Coreg, Lasix, Cozaar, aspirin,  Crestor, Coumadin.  Chronic  pain  Continue home pain regimen. Continue Neurontin, Ultram.  Controlled.  Chronic kidney disease stage II  Creatinine minimally elevated from 0.93-1.13.  Follow BMP closely as outpatient which will be done as per the OPAT protocol.  Essential hypertension  Controlled. Continue Coreg, Lasix, Cozaar.  History of recurrent genital herpes  Continue Valtrex as needed.  Hyperthyroidism  Continue methimazole. Outpatient follow-up with PCP.  Clinically euthyroid.  Thrombocytopenia, mild chronic  Stable.  As per oncology ITP versus aortic stenosis related.  Anemia Appears acute and likely related to acute illness/infectious etiology.  Baseline hemoglobin probably in the 12 g range.  Has been stable for the last 3 days.  Follow CBCs as outpatient.  Consultants:  ID General surgery Medical oncology  Procedures:  PICC line as above   Discharge Instructions  Discharge Instructions    (HEART FAILURE PATIENTS) Call MD:  Anytime you have any of the following symptoms: 1) 3 pound weight gain in 24 hours or 5 pounds in 1 week 2) shortness of breath, with or without a dry hacking cough 3) swelling in the hands, feet or stomach 4) if you have to sleep on extra pillows at night in order to breathe.   Complete by: As directed    Call MD for:  difficulty breathing, headache or visual disturbances   Complete by: As directed    Call MD for:  extreme fatigue   Complete by: As directed    Call MD for:  persistant dizziness or light-headedness   Complete by: As directed    Call MD for:  persistant nausea and vomiting   Complete by: As directed    Call MD for:  redness, tenderness, or signs of infection (pain, swelling, redness, odor or green/yellow discharge around incision site)   Complete by: As directed    Call MD for:  severe uncontrolled pain   Complete by: As directed    Call MD for:  temperature >100.4   Complete by: As directed    Diet - low sodium heart healthy    Complete by: As directed    Home infusion instructions [facility] May follow [place] Dosing Protocol; May administer Cathflo as needed to maintain patency of vascular access device.; Flushing of vascular access device: per [facility] Protocol: 0.9% NaCl pre/post medica...   Complete by: As directed    Instructions: May follow [place] Dosing Protocol   Instructions: May administer Cathflo as needed to maintain patency of vascular access device.   Instructions: Flushing of vascular access device: per [facility] Protocol: 0.9% NaCl pre/post medication administration and prn patency; Heparin 100 u/ml, 25m for implanted ports and Heparin 10u/ml, 527mfor all other central venous catheters.   Instructions: May follow [facility] Anaphylaxis Protocol for First Dose Administration in the home: 0.9% NaCl at 25-50 ml/hr to maintain IV access for protocol meds. Epinephrine 0.3 ml IV/IM PRN and Benadryl 25-50 IV/IM PRN s/s of anaphylaxis.   Instructions: [facility] Coordinator (RN) to assist per patient IV care needs in the home PRN.   Increase activity slowly   Complete by: As directed        Medication List    TAKE these medications   acetaminophen 500 MG tablet Commonly known as: TYLENOL Take 1,000 mg by mouth daily as needed (PAIN).   albuterol 108 (90 Base) MCG/ACT inhaler Commonly known as: VENTOLIN HFA Inhale 2 puffs into the lungs every 6 (six) hours as needed for wheezing or  shortness of breath.   alendronate 70 MG tablet Commonly known as: FOSAMAX TAKE  1 TABLET BY MOUTH EVERY SATURDAY WITH A FULL GLASS OF WATER ON A EMPTY STOMACH What changed: See the new instructions.   aspirin EC 81 MG tablet Take 81 mg daily by mouth.   budesonide-formoterol 160-4.5 MCG/ACT inhaler Commonly known as: Symbicort Inhale 2 puffs into the lungs 2 (two) times daily.   carvedilol 25 MG tablet Commonly known as: COREG Take 1 tablet by mouth twice daily   cefTRIAXone  IVPB Commonly  known as: ROCEPHIN Inject 2 g into the vein daily for 18 days. Indication:  Breast cellulitis and abscess  Last Day of Therapy:  09/19/2018 Labs - Once weekly:  CBC/D and BMP   diclofenac sodium 1 % Gel Commonly known as: VOLTAREN Apply 4 g topically 4 (four) times daily.   diphenhydramine-acetaminophen 25-500 MG Tabs tablet Commonly known as: TYLENOL PM Take 2 tablets by mouth at bedtime.   furosemide 20 MG tablet Commonly known as: LASIX Take 1 tablet (20 mg total) by mouth daily.   losartan 50 MG tablet Commonly known as: COZAAR Take 1 tablet by mouth once daily   methimazole 5 MG tablet Commonly known as: TAPAZOLE Take 3 tablets (15 mg total) by mouth every evening.   rosuvastatin 40 MG tablet Commonly known as: Crestor Take 1 tablet (40 mg total) by mouth daily.   senna-docusate 8.6-50 MG tablet Commonly known as: Senokot-S Take 2 tablets at bedtime by mouth.   tamoxifen 20 MG tablet Commonly known as: NOLVADEX Take 1 tablet by mouth once daily   traMADol 50 MG tablet Commonly known as: ULTRAM Take 1 tablet (50 mg total) by mouth 3 (three) times daily.   valACYclovir 500 MG tablet Commonly known as: Valtrex Take 1 tablet (500 mg total) by mouth 2 (two) times daily. For 3 days when outbreaks. What changed:   when to take this  reasons to take this   Vitamin D3 10 MCG (400 UNIT) tablet Take 1 tablet (400 Units total) by mouth daily.   warfarin 1 MG tablet Commonly known as: Coumadin Take as directed. If you are unsure how to take this medication, talk to your nurse or doctor. Original instructions: 1 tab wednesdays What changed:   how much to take  how to take this  when to take this  additional instructions   warfarin 3 MG tablet Commonly known as: COUMADIN Take as directed. If you are unsure how to take this medication, talk to your nurse or doctor. Original instructions: TAKE AS DIRECTED BY  ANTICOAGULATION  CLINIC What changed:   how much  to take  how to take this  when to take this  additional instructions      Follow-up Information    [facility] Follow up.   Specialty: Home Health Services Why: Follow up for Nursing needs Contact information: [place] [address] [phone]        Powers, Evern Core, MD Follow up on 09/18/2018.   Specialty: Infectious Diseases Why: 9:30 am. Contact information: [place] [address] [phone]        [name], Danielle G, MD. Schedule an appointment as soon as possible for a visit in 1 week(s).   Specialty: Family Medicine Why: Please follow labs (CBC & BMP) that will be drawn by home health services. Contact information: [place] [address] [phone]  [name], MD .   Specialty: Cardiology Contact information: [address] [place] [address] [phone]          Allergies  Allergen Reactions  . Lisinopril Cough  . Tetanus Toxoid Adsorbed Swelling    Arm swelling      Procedures/Studies: Ct Abdomen Wo Contrast  Result Date: 08/28/2018 CLINICAL DATA:  Left upper abdominal tenderness and pain. Cellulitis. Erysipelas. EXAM: CT ABDOMEN WITHOUT CONTRAST TECHNIQUE: Multidetector CT imaging of the abdomen was performed following the standard protocol without IV contrast. COMPARISON:  None. FINDINGS: Lower chest: There is thickening of the skin of the left breast. There is a 4 x 2 cm fluid collection deep in the left breast adjacent to the chest wall with a small metallic marker adjacent to the collection. This probably represents a seroma from previous breast surgery. There is slight thickening of the skin along the left lateral aspect of the upper abdomen without underlying cellulitis or abscess. No infiltrates or effusions. Small emphysematous blebs at both lung bases. Aortic atherosclerosis. Hepatobiliary: Numerous small gallstones. 18 mm  low-density lesion in the right lobe of the liver adjacent to the gallbladder consistent with a cyst. 8 mm low-density lesion in the small left lobe of the liver, also probably a cyst. No dilated bile ducts. Pancreas: Unremarkable. No pancreatic ductal dilatation or surrounding inflammatory changes. Spleen: Normal in size without focal abnormality. Adrenals/Urinary Tract: Normal adrenal glands. Small exophytic cysts in the left kidney. No hydronephrosis. Stomach/Bowel: Small hiatal hernia. Scattered diverticula in the splenic flexure of the colon. Visualized small bowel is normal. Vascular/Lymphatic: Aortic atherosclerosis. No enlarged abdominal lymph nodes. Slightly dilated left common iliac artery to 17 mm. Other: No ascites, free air, or abdominal wall hernia. Musculoskeletal: Multilevel degenerative disc disease in the lumbar spine. No acute bony abnormality. IMPRESSION: 1. Skin thickening of the left breast and more subtle skin thickening adjacent to the left upper quadrant of the abdomen with no underlying cellulitis or abscess. 2. Small fluid collection deep within the left breast is probably a seroma from previous breast surgery. 3. Cholelithiasis. 4.  Aortic Atherosclerosis (ICD10-I70.0). 5. 17 mm dilatation of the left common iliac artery. Electronically Signed   By: Lorriane Shire M.D.   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 CT exam performed 08/28/2018 shows a 4 x 2 centimeter fluid collection deep in the LEFT breast adjacent to the chest wall. Skin thickening of the LEFT breast was also noted on CT exam. Patient has diffuse erythema and pain in the LEFT breast. EXAM: ULTRASOUND OF THE LEFT BREAST COMPARISON:  CT exam on 08/28/2018 and multiple previous breast imaging studies, including diagnostic 12/15/2017. FINDINGS: Targeted ultrasound is performed, showing an near  anechoic irregular mass with irregular margins in the 6 o'clock location of the LEFT breast, posterior in location which measures 2.2 x 4.2 x 1.5 centimeters. There is no associated internal blood flow. There is skin and trabecular thickening of the LEFT breast. IMPRESSION: Fluid collection deep within the 6 o'clock location of the LEFT breast is favored to represent seroma cavity. Although there is skin and trabecular thickening in the central portion of the breast, no discrete fluid collections are identified to indicate presence of abscess. The skin and trabecular thickening could be related to post treatment changes or mastitis. RECOMMENDATION: Appropriate treatment if needed for possible mastitis. Recommend  follow-up LEFT mammogram and ultrasound at a Breast Imaging facility. I have discussed the findings and recommendations with the patient. Results were also provided in writing at the conclusion of the visit. If applicable, a reminder letter will be sent to the patient regarding the next appointment. Electronically Signed   By: Nolon Nations M.D.   On: 08/29/2018 11:24   [place] Ekg Site Rite  Result Date: 09/01/2018 If Site Rite image not attached, placement could not be confirmed due to current cardiac rhythm.     Subjective: Patient interviewed and examined along with her female RN as chaperone in the room.  Patient reports feeling much better.  Left breast swelling has almost resolved and is back to prior baseline of chronic mild swelling.  Redness significantly improved.  Has never had pain in that breast.  No other complaints reported.  Discharge Exam:  Vitals:   09/01/18 0611 09/01/18 0615 09/01/18 0819 09/01/18 1130  BP: 109/64   134/74  Pulse: 78   68  Resp:      Temp: 98 F (36.7 C)     TempSrc: Oral     SpO2: 98%  99%   Weight:  115.2 kg    Height:        General exam: Pleasant elderly female, moderately built and obese sitting up comfortably in chair. Respiratory system:  Clear to auscultation. Respiratory effort normal.  Cardiovascular system: S1 & S2 heard, RRR. No JVD, murmurs, rubs, gallops or clicks. No pedal edema. Gastrointestinal system: Abdomen is nondistended, soft and nontender. No organomegaly or masses felt. Normal bowel sounds heard. Central nervous system: Alert and oriented. No focal neurological deficits. Extremities: Symmetric 5 x 5 power. Skin: Midline CABG scar.  Left breast skin shows significant improvement.  Minimal warmth but improved compared to 3 days ago.  Erythema has almost resolved now with residual mild darkish discoloration of left breast skin.  No tenderness, crepitus or fluctuation. Psychiatry: Judgement and insight appear normal. Mood & affect appropriate.    The results of significant diagnostics from this hospitalization (including imaging, microbiology, ancillary and laboratory) are listed below for reference.     Microbiology: Recent Results (from the past 240 hour(s))  Blood Cultures x 2 sites     Status: None (Preliminary result)   Collection Time: 08/28/18  5:25 PM   Specimen: BLOOD RIGHT HAND  Result Value Ref Range Status   Specimen Description   Final    BLOOD RIGHT HAND Performed at [facility], [place] [address], [place], [place] [address]    Special Requests   Final    BOTTLES DRAWN AEROBIC ONLY Blood Culture adequate volume Performed at [place] [address], [place], [place] [address]    Culture   Final    NO GROWTH 4 DAYS Performed at [facility], [place] [address], [place], [place] [address]    Report Status PENDING  Incomplete  Blood Cultures x 2 sites     Status: Abnormal   Collection Time: 08/28/18  5:25 PM   Specimen: BLOOD RIGHT HAND  Result Value Ref Range Status   Specimen Description   Final    BLOOD RIGHT HAND Performed at [place] [address] [place], [place] [address]    Special Requests   Final    BOTTLES  DRAWN AEROBIC ONLY Blood Culture results may not be optimal due to an inadequate volume of blood received in culture bottles Performed at [facility], [place] [address], [place], [place] [address]    Culture  Setup Time   Final    GRAM POSITIVE COCCI AEROBIC BOTTLE ONLY CRITICAL RESULT CALLED TO, READ BACK BY AND VERIFIED WITH: Shelda Jakes PharmD 14:40 08/29/18 ([email or web address])    Culture (A)  Final    STAPHYLOCOCCUS SPECIES (COAGULASE NEGATIVE) THE SIGNIFICANCE OF ISOLATING THIS ORGANISM FROM A SINGLE SET OF BLOOD CULTURES WHEN MULTIPLE SETS ARE DRAWN IS UNCERTAIN. PLEASE NOTIFY THE MICROBIOLOGY DEPARTMENT WITHIN ONE WEEK IF SPECIATION AND SENSITIVITIES ARE REQUIRED. Performed at [facility], [place] [address] [place], [place] [address]    Report Status 08/30/2018 FINAL  Final  Blood Culture ID Panel (Reflexed)     Status: Abnormal   Collection Time: 08/28/18  5:25 PM  Result Value Ref Range Status   Enterococcus species NOT DETECTED NOT DETECTED Final   Listeria monocytogenes NOT DETECTED NOT DETECTED Final   Staphylococcus species DETECTED (A) NOT DETECTED Final    Comment: Methicillin (oxacillin) susceptible coagulase negative staphylococcus. Possible blood culture contaminant (unless isolated from more than one blood culture draw or clinical case suggests pathogenicity). No antibiotic treatment is indicated for blood  culture contaminants. CRITICAL RESULT CALLED TO, READ BACK BY AND VERIFIED WITH: Shelda Jakes PharmD 14:40 08/29/18 ([email or web address])    Staphylococcus aureus (BCID) NOT DETECTED NOT DETECTED Final   Methicillin resistance NOT DETECTED NOT DETECTED Final   Streptococcus species NOT DETECTED NOT DETECTED Final   Streptococcus agalactiae NOT DETECTED NOT DETECTED Final   Streptococcus pneumoniae NOT DETECTED NOT DETECTED Final   Streptococcus pyogenes NOT DETECTED NOT DETECTED Final   Acinetobacter baumannii NOT DETECTED NOT DETECTED Final   Enterobacteriaceae species  NOT DETECTED NOT DETECTED Final   Enterobacter cloacae complex NOT DETECTED NOT DETECTED Final   Escherichia coli NOT DETECTED NOT DETECTED Final   Klebsiella oxytoca NOT DETECTED NOT DETECTED Final   Klebsiella pneumoniae NOT DETECTED NOT DETECTED Final   Proteus species NOT DETECTED NOT DETECTED Final   Serratia marcescens NOT DETECTED NOT DETECTED Final   Haemophilus influenzae NOT DETECTED NOT DETECTED Final   Neisseria meningitidis NOT DETECTED NOT DETECTED Final   Pseudomonas aeruginosa NOT DETECTED NOT DETECTED Final   Candida albicans NOT DETECTED NOT DETECTED Final   Candida glabrata NOT DETECTED NOT DETECTED Final   Candida krusei NOT DETECTED NOT DETECTED Final   Candida parapsilosis NOT DETECTED NOT DETECTED Final   Candida tropicalis NOT DETECTED NOT DETECTED Final    Comment: Performed at [facility], [address] [place], [place] [address]  Novel Coronavirus,NAA,(SEND-OUT TO REF LAB - TAT 24-48 hrs); Hosp Order     Status: None   Collection Time: 08/28/18  5:41 PM   Specimen: Nasopharyngeal Swab; Respiratory  Result Value Ref Range Status   SARS-CoV-2, NAA NOT DETECTED NOT DETECTED Final    Comment: (NOTE) This test was developed and its performance characteristics determined by [employer]. This test has not been FDA cleared or approved. This test has been authorized by FDA under an Emergency Use Authorization (EUA). This test is only authorized for the duration of time the declaration that circumstances exist justifying the authorization of the emergency use of in vitro diagnostic tests for detection of SARS-CoV-2 virus and/or diagnosis of COVID-19 infection under section 564(b)(1) of the Act, 21 U.S.C. 403TCY-8(L)(8), unless the authorization is terminated or revoked sooner. When diagnostic testing is negative, the possibility of a false negative result should be considered in the context of a patient's  recent exposures and the presence of  clinical signs and symptoms consistent with COVID-19. An individual without symptoms of COVID-19 and who is not shedding SARS-CoV-2 virus would expect to have a negative (not detected) result in this assay. Performed  At: [facility] [place], [place] [id number] Rush Farmer MD [id number]    [place]  Final    Comment: Performed at [place] [address], [place], [place] [address]  Culture, blood (routine x 2)     Status: None (Preliminary result)   Collection Time: 08/30/18  3:17 PM   Specimen: BLOOD  Result Value Ref Range Status   Specimen Description   Final    BLOOD RIGHT ANTECUBITAL Performed at [place] [address] [place], [place] [address]    Special Requests   Final    BOTTLES DRAWN AEROBIC ONLY Blood Culture adequate volume Performed at [place] [address] [place], [place] [address]    Culture   Final    NO GROWTH 2 DAYS Performed at [facility] [address], [place], [place] [address]    Report Status PENDING  Incomplete  Culture, blood (routine x 2)     Status: None (Preliminary result)   Collection Time: 08/30/18  3:17 PM   Specimen: BLOOD  Result Value Ref Range Status   Specimen Description   Final    BLOOD RIGHT ANTECUBITAL Performed at [place] [address], [place], [place] [address]    Special Requests   Final    BOTTLES DRAWN AEROBIC ONLY Blood Culture adequate volume Performed at [facility] [place] [address] [place], [place] [address]    Culture   Final    NO GROWTH 2 DAYS Performed at [facility] [place] [address] [place],  [address]    Report Status PENDING  Incomplete     Labs: CBC: Recent Labs  Lab 08/28/18 1725 08/29/18 0442 08/30/18 0427 09/01/18 0436  WBC 10.9* 8.4 7.5 5.0  NEUTROABS 9.0*  --  5.1  --   HGB 12.2 10.7* 11.0* 10.5*   HCT 40.3 34.1* 35.0* 33.5*  MCV 99.8 98.6 98.9 98.0  PLT 82* 73* 74* 81*   Basic Metabolic Panel: Recent Labs  Lab 08/28/18 1725 08/29/18 0442 08/30/18 0427 09/01/18 0436  NA 140 140 139 138  K 3.9 3.7 3.9 3.7  CL 111 112* 112* 110  CO2 21* 21* 19* 23  GLUCOSE 90 92 87 94  BUN '13 15 13 15  ' CREATININE 1.04* 1.18* 0.93 1.13*  CALCIUM 9.3 8.4* 8.4* 8.5*  MG 2.1  --   --   --    Liver Function Tests: Recent Labs  Lab 08/29/18 0442  AST 10*  ALT 10  ALKPHOS 36*  BILITOT 0.7  PROT 5.8*  ALBUMIN 3.0*   Unsuccessfully attempted to reach spouse to update care and answer questions.  Left VM message to call back.  Time coordinating discharge: 40 minutes  SIGNED:  Vernell Leep, MD, FACP, [facility]. [facility]  To contact the attending provider between 7A-7P or the covering provider during after hours 7P-7A, please log into the web site [email or web address] and access using universal [place] password for that web site. If you do not have the password, please call the hospital operator.

## 2018-09-02 DIAGNOSIS — I700 Atherosclerosis of aorta: Secondary | ICD-10-CM | POA: Diagnosis not present

## 2018-09-02 DIAGNOSIS — J449 Chronic obstructive pulmonary disease, unspecified: Secondary | ICD-10-CM | POA: Diagnosis not present

## 2018-09-02 DIAGNOSIS — D696 Thrombocytopenia, unspecified: Secondary | ICD-10-CM | POA: Diagnosis not present

## 2018-09-02 DIAGNOSIS — I5022 Chronic systolic (congestive) heart failure: Secondary | ICD-10-CM | POA: Diagnosis not present

## 2018-09-02 DIAGNOSIS — K5730 Diverticulosis of large intestine without perforation or abscess without bleeding: Secondary | ICD-10-CM | POA: Diagnosis not present

## 2018-09-02 DIAGNOSIS — M150 Primary generalized (osteo)arthritis: Secondary | ICD-10-CM | POA: Diagnosis not present

## 2018-09-02 DIAGNOSIS — I2510 Atherosclerotic heart disease of native coronary artery without angina pectoris: Secondary | ICD-10-CM | POA: Diagnosis not present

## 2018-09-02 DIAGNOSIS — I130 Hypertensive heart and chronic kidney disease with heart failure and stage 1 through stage 4 chronic kidney disease, or unspecified chronic kidney disease: Secondary | ICD-10-CM | POA: Diagnosis not present

## 2018-09-02 DIAGNOSIS — I723 Aneurysm of iliac artery: Secondary | ICD-10-CM | POA: Diagnosis not present

## 2018-09-02 DIAGNOSIS — D630 Anemia in neoplastic disease: Secondary | ICD-10-CM | POA: Diagnosis not present

## 2018-09-02 DIAGNOSIS — R7881 Bacteremia: Secondary | ICD-10-CM | POA: Diagnosis not present

## 2018-09-02 DIAGNOSIS — I480 Paroxysmal atrial fibrillation: Secondary | ICD-10-CM | POA: Diagnosis not present

## 2018-09-02 DIAGNOSIS — K2970 Gastritis, unspecified, without bleeding: Secondary | ICD-10-CM | POA: Diagnosis not present

## 2018-09-02 DIAGNOSIS — N61 Mastitis without abscess: Secondary | ICD-10-CM | POA: Diagnosis not present

## 2018-09-02 DIAGNOSIS — L03311 Cellulitis of abdominal wall: Secondary | ICD-10-CM | POA: Diagnosis not present

## 2018-09-02 DIAGNOSIS — N183 Chronic kidney disease, stage 3 (moderate): Secondary | ICD-10-CM | POA: Diagnosis not present

## 2018-09-02 DIAGNOSIS — D631 Anemia in chronic kidney disease: Secondary | ICD-10-CM | POA: Diagnosis not present

## 2018-09-02 DIAGNOSIS — M5136 Other intervertebral disc degeneration, lumbar region: Secondary | ICD-10-CM | POA: Diagnosis not present

## 2018-09-02 DIAGNOSIS — I69351 Hemiplegia and hemiparesis following cerebral infarction affecting right dominant side: Secondary | ICD-10-CM | POA: Diagnosis not present

## 2018-09-02 DIAGNOSIS — B9689 Other specified bacterial agents as the cause of diseases classified elsewhere: Secondary | ICD-10-CM | POA: Diagnosis not present

## 2018-09-02 DIAGNOSIS — I428 Other cardiomyopathies: Secondary | ICD-10-CM | POA: Diagnosis not present

## 2018-09-02 DIAGNOSIS — Z452 Encounter for adjustment and management of vascular access device: Secondary | ICD-10-CM | POA: Diagnosis not present

## 2018-09-02 DIAGNOSIS — K449 Diaphragmatic hernia without obstruction or gangrene: Secondary | ICD-10-CM | POA: Diagnosis not present

## 2018-09-02 DIAGNOSIS — C50412 Malignant neoplasm of upper-outer quadrant of left female breast: Secondary | ICD-10-CM | POA: Diagnosis not present

## 2018-09-02 DIAGNOSIS — L0291 Cutaneous abscess, unspecified: Secondary | ICD-10-CM | POA: Diagnosis not present

## 2018-09-02 DIAGNOSIS — K8020 Calculus of gallbladder without cholecystitis without obstruction: Secondary | ICD-10-CM | POA: Diagnosis not present

## 2018-09-02 LAB — CULTURE, BLOOD (ROUTINE X 2)
Culture: NO GROWTH
Special Requests: ADEQUATE

## 2018-09-04 ENCOUNTER — Telehealth: Payer: Self-pay | Admitting: *Deleted

## 2018-09-04 ENCOUNTER — Telehealth: Payer: Self-pay | Admitting: Family Medicine

## 2018-09-04 DIAGNOSIS — M150 Primary generalized (osteo)arthritis: Secondary | ICD-10-CM | POA: Diagnosis not present

## 2018-09-04 DIAGNOSIS — Z452 Encounter for adjustment and management of vascular access device: Secondary | ICD-10-CM | POA: Diagnosis not present

## 2018-09-04 DIAGNOSIS — J449 Chronic obstructive pulmonary disease, unspecified: Secondary | ICD-10-CM | POA: Diagnosis not present

## 2018-09-04 DIAGNOSIS — C50412 Malignant neoplasm of upper-outer quadrant of left female breast: Secondary | ICD-10-CM | POA: Diagnosis not present

## 2018-09-04 DIAGNOSIS — N183 Chronic kidney disease, stage 3 (moderate): Secondary | ICD-10-CM | POA: Diagnosis not present

## 2018-09-04 DIAGNOSIS — K2970 Gastritis, unspecified, without bleeding: Secondary | ICD-10-CM | POA: Diagnosis not present

## 2018-09-04 DIAGNOSIS — R7881 Bacteremia: Secondary | ICD-10-CM | POA: Diagnosis not present

## 2018-09-04 DIAGNOSIS — D631 Anemia in chronic kidney disease: Secondary | ICD-10-CM | POA: Diagnosis not present

## 2018-09-04 DIAGNOSIS — I2510 Atherosclerotic heart disease of native coronary artery without angina pectoris: Secondary | ICD-10-CM | POA: Diagnosis not present

## 2018-09-04 DIAGNOSIS — K449 Diaphragmatic hernia without obstruction or gangrene: Secondary | ICD-10-CM | POA: Diagnosis not present

## 2018-09-04 DIAGNOSIS — N61 Mastitis without abscess: Secondary | ICD-10-CM | POA: Diagnosis not present

## 2018-09-04 DIAGNOSIS — N611 Abscess of the breast and nipple: Secondary | ICD-10-CM | POA: Diagnosis not present

## 2018-09-04 DIAGNOSIS — I130 Hypertensive heart and chronic kidney disease with heart failure and stage 1 through stage 4 chronic kidney disease, or unspecified chronic kidney disease: Secondary | ICD-10-CM | POA: Diagnosis not present

## 2018-09-04 DIAGNOSIS — K5730 Diverticulosis of large intestine without perforation or abscess without bleeding: Secondary | ICD-10-CM | POA: Diagnosis not present

## 2018-09-04 DIAGNOSIS — L03311 Cellulitis of abdominal wall: Secondary | ICD-10-CM | POA: Diagnosis not present

## 2018-09-04 DIAGNOSIS — K8020 Calculus of gallbladder without cholecystitis without obstruction: Secondary | ICD-10-CM | POA: Diagnosis not present

## 2018-09-04 DIAGNOSIS — D696 Thrombocytopenia, unspecified: Secondary | ICD-10-CM | POA: Diagnosis not present

## 2018-09-04 DIAGNOSIS — B9689 Other specified bacterial agents as the cause of diseases classified elsewhere: Secondary | ICD-10-CM | POA: Diagnosis not present

## 2018-09-04 DIAGNOSIS — D630 Anemia in neoplastic disease: Secondary | ICD-10-CM | POA: Diagnosis not present

## 2018-09-04 DIAGNOSIS — M5136 Other intervertebral disc degeneration, lumbar region: Secondary | ICD-10-CM | POA: Diagnosis not present

## 2018-09-04 DIAGNOSIS — I428 Other cardiomyopathies: Secondary | ICD-10-CM | POA: Diagnosis not present

## 2018-09-04 DIAGNOSIS — I723 Aneurysm of iliac artery: Secondary | ICD-10-CM | POA: Diagnosis not present

## 2018-09-04 DIAGNOSIS — I700 Atherosclerosis of aorta: Secondary | ICD-10-CM | POA: Diagnosis not present

## 2018-09-04 DIAGNOSIS — I480 Paroxysmal atrial fibrillation: Secondary | ICD-10-CM | POA: Diagnosis not present

## 2018-09-04 DIAGNOSIS — I5022 Chronic systolic (congestive) heart failure: Secondary | ICD-10-CM | POA: Diagnosis not present

## 2018-09-04 DIAGNOSIS — I69351 Hemiplegia and hemiparesis following cerebral infarction affecting right dominant side: Secondary | ICD-10-CM | POA: Diagnosis not present

## 2018-09-04 LAB — CULTURE, BLOOD (ROUTINE X 2)
Culture: NO GROWTH
Culture: NO GROWTH
Special Requests: ADEQUATE
Special Requests: ADEQUATE

## 2018-09-04 NOTE — Telephone Encounter (Signed)
Transition Care Management Follow-up Telephone Call   Date discharged? 09/01/2018   How have you been since you were released from the hospital? "fine I got a PICC in"   Do you understand why you were in the hospital? yes   Do you understand the discharge instructions? yes   Where were you discharged to? Home    Items Reviewed:  Medications reviewed: yes  Allergies reviewed: yes  Dietary changes reviewed: N/A   Referrals reviewed: yes   Functional Questionnaire:   Activities of Daily Living (ADLs):   She states they are independent in the following: ambulation, bathing and hygiene, feeding, continence, grooming, toileting and dressing States they require assistance with the following: N/A    Any transportation issues/concerns?: no   Any patient concerns? no   Confirmed importance and date/time of follow-up visits scheduled yes   Provider Appointment booked with (Clinic RN offered patient an appointment for Wednesday -virtually- patient reports nurse is going to come out to change PICC Line dressing and she will call back.)  Confirmed with patient if condition begins to worsen call PCP or go to the ER.  Patient was given the office number and encouraged to call back with question or concerns.  : yes

## 2018-09-04 NOTE — Telephone Encounter (Signed)
Danielle [name] from [facility] would like a call back for verbal orders please.

## 2018-09-05 ENCOUNTER — Encounter (INDEPENDENT_AMBULATORY_CARE_PROVIDER_SITE_OTHER): Payer: Medicare Other | Admitting: Cardiology

## 2018-09-05 ENCOUNTER — Telehealth: Payer: Self-pay | Admitting: *Deleted

## 2018-09-05 DIAGNOSIS — K8020 Calculus of gallbladder without cholecystitis without obstruction: Secondary | ICD-10-CM | POA: Diagnosis not present

## 2018-09-05 DIAGNOSIS — D631 Anemia in chronic kidney disease: Secondary | ICD-10-CM | POA: Diagnosis not present

## 2018-09-05 DIAGNOSIS — I700 Atherosclerosis of aorta: Secondary | ICD-10-CM | POA: Diagnosis not present

## 2018-09-05 DIAGNOSIS — M150 Primary generalized (osteo)arthritis: Secondary | ICD-10-CM | POA: Diagnosis not present

## 2018-09-05 DIAGNOSIS — M5136 Other intervertebral disc degeneration, lumbar region: Secondary | ICD-10-CM | POA: Diagnosis not present

## 2018-09-05 DIAGNOSIS — D696 Thrombocytopenia, unspecified: Secondary | ICD-10-CM | POA: Diagnosis not present

## 2018-09-05 DIAGNOSIS — R7881 Bacteremia: Secondary | ICD-10-CM | POA: Diagnosis not present

## 2018-09-05 DIAGNOSIS — I723 Aneurysm of iliac artery: Secondary | ICD-10-CM | POA: Diagnosis not present

## 2018-09-05 DIAGNOSIS — C50412 Malignant neoplasm of upper-outer quadrant of left female breast: Secondary | ICD-10-CM | POA: Diagnosis not present

## 2018-09-05 DIAGNOSIS — I5022 Chronic systolic (congestive) heart failure: Secondary | ICD-10-CM | POA: Diagnosis not present

## 2018-09-05 DIAGNOSIS — I2510 Atherosclerotic heart disease of native coronary artery without angina pectoris: Secondary | ICD-10-CM | POA: Diagnosis not present

## 2018-09-05 DIAGNOSIS — K5730 Diverticulosis of large intestine without perforation or abscess without bleeding: Secondary | ICD-10-CM | POA: Diagnosis not present

## 2018-09-05 DIAGNOSIS — I428 Other cardiomyopathies: Secondary | ICD-10-CM | POA: Diagnosis not present

## 2018-09-05 DIAGNOSIS — Z452 Encounter for adjustment and management of vascular access device: Secondary | ICD-10-CM | POA: Diagnosis not present

## 2018-09-05 DIAGNOSIS — I69351 Hemiplegia and hemiparesis following cerebral infarction affecting right dominant side: Secondary | ICD-10-CM | POA: Diagnosis not present

## 2018-09-05 DIAGNOSIS — Z7901 Long term (current) use of anticoagulants: Secondary | ICD-10-CM

## 2018-09-05 DIAGNOSIS — I130 Hypertensive heart and chronic kidney disease with heart failure and stage 1 through stage 4 chronic kidney disease, or unspecified chronic kidney disease: Secondary | ICD-10-CM | POA: Diagnosis not present

## 2018-09-05 DIAGNOSIS — D630 Anemia in neoplastic disease: Secondary | ICD-10-CM | POA: Diagnosis not present

## 2018-09-05 DIAGNOSIS — N61 Mastitis without abscess: Secondary | ICD-10-CM | POA: Diagnosis not present

## 2018-09-05 DIAGNOSIS — K449 Diaphragmatic hernia without obstruction or gangrene: Secondary | ICD-10-CM | POA: Diagnosis not present

## 2018-09-05 DIAGNOSIS — B9689 Other specified bacterial agents as the cause of diseases classified elsewhere: Secondary | ICD-10-CM | POA: Diagnosis not present

## 2018-09-05 DIAGNOSIS — L03311 Cellulitis of abdominal wall: Secondary | ICD-10-CM | POA: Diagnosis not present

## 2018-09-05 DIAGNOSIS — I480 Paroxysmal atrial fibrillation: Secondary | ICD-10-CM | POA: Diagnosis not present

## 2018-09-05 DIAGNOSIS — J449 Chronic obstructive pulmonary disease, unspecified: Secondary | ICD-10-CM | POA: Diagnosis not present

## 2018-09-05 DIAGNOSIS — K2970 Gastritis, unspecified, without bleeding: Secondary | ICD-10-CM | POA: Diagnosis not present

## 2018-09-05 DIAGNOSIS — N183 Chronic kidney disease, stage 3 (moderate): Secondary | ICD-10-CM | POA: Diagnosis not present

## 2018-09-05 NOTE — Telephone Encounter (Signed)
Copied from [place] [id number]. Topic: General - Other >> Sep 05, 2018 10:17 AM Rainey Pines A wrote: [facility] home health nurse would like a callback in regards to INR report  Best contact number [phone]

## 2018-09-05 NOTE — Telephone Encounter (Signed)
Spoke with [place] from [place] and she gave results of PTINR results for patient that were done on 09/04/2018:   PT: 25.2, INR: 2.5

## 2018-09-05 NOTE — Progress Notes (Signed)
This encounter was created in error - please disregard.

## 2018-09-06 NOTE — Telephone Encounter (Signed)
Spoke with [place] from [facility] and she gave results of PTINR results for patient that were done on 09/04/2018:   PT: 25.2, INR: 2.5   Message sent to PCP.

## 2018-09-07 DIAGNOSIS — R7881 Bacteremia: Secondary | ICD-10-CM | POA: Diagnosis not present

## 2018-09-07 DIAGNOSIS — L0291 Cutaneous abscess, unspecified: Secondary | ICD-10-CM | POA: Diagnosis not present

## 2018-09-08 ENCOUNTER — Inpatient Hospital Stay: Payer: Medicare Other | Admitting: Hematology

## 2018-09-08 ENCOUNTER — Inpatient Hospital Stay: Payer: Medicare Other | Attending: Hematology

## 2018-09-08 ENCOUNTER — Other Ambulatory Visit: Payer: Self-pay

## 2018-09-08 ENCOUNTER — Encounter: Payer: Self-pay | Admitting: Hematology

## 2018-09-08 ENCOUNTER — Telehealth: Payer: Self-pay | Admitting: Hematology

## 2018-09-08 VITALS — BP 155/87 | HR 73 | Temp 98.3°F | Resp 20 | Ht 67.0 in | Wt 251.7 lb

## 2018-09-08 DIAGNOSIS — Z79899 Other long term (current) drug therapy: Secondary | ICD-10-CM | POA: Diagnosis not present

## 2018-09-08 DIAGNOSIS — R531 Weakness: Secondary | ICD-10-CM | POA: Diagnosis not present

## 2018-09-08 DIAGNOSIS — C50412 Malignant neoplasm of upper-outer quadrant of left female breast: Secondary | ICD-10-CM

## 2018-09-08 DIAGNOSIS — I5022 Chronic systolic (congestive) heart failure: Secondary | ICD-10-CM | POA: Diagnosis not present

## 2018-09-08 DIAGNOSIS — E2839 Other primary ovarian failure: Secondary | ICD-10-CM

## 2018-09-08 DIAGNOSIS — N61 Mastitis without abscess: Secondary | ICD-10-CM | POA: Diagnosis not present

## 2018-09-08 DIAGNOSIS — Z7901 Long term (current) use of anticoagulants: Secondary | ICD-10-CM | POA: Diagnosis not present

## 2018-09-08 DIAGNOSIS — M129 Arthropathy, unspecified: Secondary | ICD-10-CM | POA: Insufficient documentation

## 2018-09-08 DIAGNOSIS — Z170 Estrogen receptor positive status [ER+]: Secondary | ICD-10-CM | POA: Insufficient documentation

## 2018-09-08 DIAGNOSIS — Z923 Personal history of irradiation: Secondary | ICD-10-CM | POA: Diagnosis not present

## 2018-09-08 DIAGNOSIS — M810 Age-related osteoporosis without current pathological fracture: Secondary | ICD-10-CM

## 2018-09-08 DIAGNOSIS — D696 Thrombocytopenia, unspecified: Secondary | ICD-10-CM | POA: Insufficient documentation

## 2018-09-08 DIAGNOSIS — G4700 Insomnia, unspecified: Secondary | ICD-10-CM | POA: Diagnosis not present

## 2018-09-08 DIAGNOSIS — I350 Nonrheumatic aortic (valve) stenosis: Secondary | ICD-10-CM | POA: Diagnosis not present

## 2018-09-08 DIAGNOSIS — F419 Anxiety disorder, unspecified: Secondary | ICD-10-CM

## 2018-09-08 DIAGNOSIS — E785 Hyperlipidemia, unspecified: Secondary | ICD-10-CM

## 2018-09-08 DIAGNOSIS — I10 Essential (primary) hypertension: Secondary | ICD-10-CM | POA: Diagnosis not present

## 2018-09-08 DIAGNOSIS — I480 Paroxysmal atrial fibrillation: Secondary | ICD-10-CM

## 2018-09-08 DIAGNOSIS — Z8673 Personal history of transient ischemic attack (TIA), and cerebral infarction without residual deficits: Secondary | ICD-10-CM | POA: Insufficient documentation

## 2018-09-08 DIAGNOSIS — J449 Chronic obstructive pulmonary disease, unspecified: Secondary | ICD-10-CM | POA: Insufficient documentation

## 2018-09-08 DIAGNOSIS — Z79810 Long term (current) use of selective estrogen receptor modulators (SERMs): Secondary | ICD-10-CM | POA: Diagnosis not present

## 2018-09-08 DIAGNOSIS — I2510 Atherosclerotic heart disease of native coronary artery without angina pectoris: Secondary | ICD-10-CM | POA: Insufficient documentation

## 2018-09-08 DIAGNOSIS — C50411 Malignant neoplasm of upper-outer quadrant of right female breast: Secondary | ICD-10-CM | POA: Diagnosis not present

## 2018-09-08 LAB — CBC WITH DIFFERENTIAL (CANCER CENTER ONLY)
Abs Immature Granulocytes: 0.01 10*3/uL (ref 0.00–0.07)
Basophils Absolute: 0 10*3/uL (ref 0.0–0.1)
Basophils Relative: 1 %
Eosinophils Absolute: 0.5 10*3/uL (ref 0.0–0.5)
Eosinophils Relative: 11 %
HCT: 37.9 % (ref 36.0–46.0)
Hemoglobin: 12 g/dL (ref 12.0–15.0)
Immature Granulocytes: 0 %
Lymphocytes Relative: 24 %
Lymphs Abs: 1.1 10*3/uL (ref 0.7–4.0)
MCH: 30.5 pg (ref 26.0–34.0)
MCHC: 31.7 g/dL (ref 30.0–36.0)
MCV: 96.2 fL (ref 80.0–100.0)
Monocytes Absolute: 0.4 10*3/uL (ref 0.1–1.0)
Monocytes Relative: 9 %
Neutro Abs: 2.6 10*3/uL (ref 1.7–7.7)
Neutrophils Relative %: 55 %
Platelet Count: 102 10*3/uL — ABNORMAL LOW (ref 150–400)
RBC: 3.94 MIL/uL (ref 3.87–5.11)
RDW: 14.2 % (ref 11.5–15.5)
WBC Count: 4.7 10*3/uL (ref 4.0–10.5)
nRBC: 0 % (ref 0.0–0.2)

## 2018-09-08 LAB — CMP (CANCER CENTER ONLY)
ALT: 12 U/L (ref 0–44)
AST: 13 U/L — ABNORMAL LOW (ref 15–41)
Albumin: 3.4 g/dL — ABNORMAL LOW (ref 3.5–5.0)
Alkaline Phosphatase: 55 U/L (ref 38–126)
Anion gap: 7 (ref 5–15)
BUN: 12 mg/dL (ref 8–23)
CO2: 23 mmol/L (ref 22–32)
Calcium: 9.2 mg/dL (ref 8.9–10.3)
Chloride: 110 mmol/L (ref 98–111)
Creatinine: 1.11 mg/dL — ABNORMAL HIGH (ref 0.44–1.00)
GFR, Est AFR Am: 57 mL/min — ABNORMAL LOW (ref >60)
GFR, Estimated: 50 mL/min — ABNORMAL LOW (ref >60)
Glucose, Bld: 92 mg/dL (ref 70–99)
Potassium: 4.3 mmol/L (ref 3.5–5.1)
Sodium: 140 mmol/L (ref 135–145)
Total Bilirubin: 0.3 mg/dL (ref 0.3–1.2)
Total Protein: 6.8 g/dL (ref 6.5–8.1)

## 2018-09-08 NOTE — Telephone Encounter (Signed)
Scheduled appt per 7/10 los. Printed and mailed appt calendar. °

## 2018-09-11 ENCOUNTER — Other Ambulatory Visit: Payer: Self-pay | Admitting: Family Medicine

## 2018-09-11 ENCOUNTER — Encounter: Payer: Medicare Other | Admitting: Family Medicine

## 2018-09-11 ENCOUNTER — Ambulatory Visit: Payer: Medicare Other

## 2018-09-11 ENCOUNTER — Other Ambulatory Visit: Payer: Self-pay

## 2018-09-11 NOTE — Progress Notes (Signed)
NO SHOW

## 2018-09-12 ENCOUNTER — Ambulatory Visit (INDEPENDENT_AMBULATORY_CARE_PROVIDER_SITE_OTHER): Payer: Medicare Other | Admitting: General Practice

## 2018-09-12 DIAGNOSIS — Z452 Encounter for adjustment and management of vascular access device: Secondary | ICD-10-CM | POA: Diagnosis not present

## 2018-09-12 DIAGNOSIS — N61 Mastitis without abscess: Secondary | ICD-10-CM | POA: Diagnosis not present

## 2018-09-12 DIAGNOSIS — I723 Aneurysm of iliac artery: Secondary | ICD-10-CM | POA: Diagnosis not present

## 2018-09-12 DIAGNOSIS — K5730 Diverticulosis of large intestine without perforation or abscess without bleeding: Secondary | ICD-10-CM | POA: Diagnosis not present

## 2018-09-12 DIAGNOSIS — D696 Thrombocytopenia, unspecified: Secondary | ICD-10-CM | POA: Diagnosis not present

## 2018-09-12 DIAGNOSIS — I69351 Hemiplegia and hemiparesis following cerebral infarction affecting right dominant side: Secondary | ICD-10-CM | POA: Diagnosis not present

## 2018-09-12 DIAGNOSIS — K2970 Gastritis, unspecified, without bleeding: Secondary | ICD-10-CM | POA: Diagnosis not present

## 2018-09-12 DIAGNOSIS — N183 Chronic kidney disease, stage 3 (moderate): Secondary | ICD-10-CM | POA: Diagnosis not present

## 2018-09-12 DIAGNOSIS — M150 Primary generalized (osteo)arthritis: Secondary | ICD-10-CM | POA: Diagnosis not present

## 2018-09-12 DIAGNOSIS — B9689 Other specified bacterial agents as the cause of diseases classified elsewhere: Secondary | ICD-10-CM | POA: Diagnosis not present

## 2018-09-12 DIAGNOSIS — R7881 Bacteremia: Secondary | ICD-10-CM | POA: Diagnosis not present

## 2018-09-12 DIAGNOSIS — L03311 Cellulitis of abdominal wall: Secondary | ICD-10-CM | POA: Diagnosis not present

## 2018-09-12 DIAGNOSIS — J449 Chronic obstructive pulmonary disease, unspecified: Secondary | ICD-10-CM | POA: Diagnosis not present

## 2018-09-12 DIAGNOSIS — I130 Hypertensive heart and chronic kidney disease with heart failure and stage 1 through stage 4 chronic kidney disease, or unspecified chronic kidney disease: Secondary | ICD-10-CM | POA: Diagnosis not present

## 2018-09-12 DIAGNOSIS — D631 Anemia in chronic kidney disease: Secondary | ICD-10-CM | POA: Diagnosis not present

## 2018-09-12 DIAGNOSIS — I428 Other cardiomyopathies: Secondary | ICD-10-CM | POA: Diagnosis not present

## 2018-09-12 DIAGNOSIS — M5136 Other intervertebral disc degeneration, lumbar region: Secondary | ICD-10-CM | POA: Diagnosis not present

## 2018-09-12 DIAGNOSIS — D630 Anemia in neoplastic disease: Secondary | ICD-10-CM | POA: Diagnosis not present

## 2018-09-12 DIAGNOSIS — I480 Paroxysmal atrial fibrillation: Secondary | ICD-10-CM | POA: Diagnosis not present

## 2018-09-12 DIAGNOSIS — I2510 Atherosclerotic heart disease of native coronary artery without angina pectoris: Secondary | ICD-10-CM | POA: Diagnosis not present

## 2018-09-12 DIAGNOSIS — Z7901 Long term (current) use of anticoagulants: Secondary | ICD-10-CM | POA: Diagnosis not present

## 2018-09-12 DIAGNOSIS — K8020 Calculus of gallbladder without cholecystitis without obstruction: Secondary | ICD-10-CM | POA: Diagnosis not present

## 2018-09-12 DIAGNOSIS — C50412 Malignant neoplasm of upper-outer quadrant of left female breast: Secondary | ICD-10-CM | POA: Diagnosis not present

## 2018-09-12 DIAGNOSIS — I700 Atherosclerosis of aorta: Secondary | ICD-10-CM | POA: Diagnosis not present

## 2018-09-12 DIAGNOSIS — I5022 Chronic systolic (congestive) heart failure: Secondary | ICD-10-CM | POA: Diagnosis not present

## 2018-09-12 DIAGNOSIS — K449 Diaphragmatic hernia without obstruction or gangrene: Secondary | ICD-10-CM | POA: Diagnosis not present

## 2018-09-12 LAB — POCT INR: INR: 2.3 (ref 2.0–3.0)

## 2018-09-12 NOTE — Patient Instructions (Signed)
Pre visit review using our clinic review tool, if applicable. No additional management support is needed unless otherwise documented below in the visit note.  Continue 3 mg daily except 1.5 mg on Wed.  Re-check in 1 week.  Dosing instructions given to Inez Catalina, RN @ [facility] while in patient's home.  Betty[facility] # [phone].

## 2018-09-14 ENCOUNTER — Other Ambulatory Visit: Payer: Self-pay

## 2018-09-14 ENCOUNTER — Encounter: Payer: Self-pay | Admitting: Family Medicine

## 2018-09-14 ENCOUNTER — Ambulatory Visit (INDEPENDENT_AMBULATORY_CARE_PROVIDER_SITE_OTHER): Payer: Medicare Other | Admitting: Family Medicine

## 2018-09-14 ENCOUNTER — Ambulatory Visit: Payer: Medicare Other

## 2018-09-14 DIAGNOSIS — N183 Chronic kidney disease, stage 3 unspecified: Secondary | ICD-10-CM

## 2018-09-14 DIAGNOSIS — I10 Essential (primary) hypertension: Secondary | ICD-10-CM

## 2018-09-14 DIAGNOSIS — N61 Mastitis without abscess: Secondary | ICD-10-CM

## 2018-09-14 DIAGNOSIS — M810 Age-related osteoporosis without current pathological fracture: Secondary | ICD-10-CM | POA: Diagnosis not present

## 2018-09-14 DIAGNOSIS — Z7901 Long term (current) use of anticoagulants: Secondary | ICD-10-CM | POA: Diagnosis not present

## 2018-09-14 MED ORDER — ALENDRONATE SODIUM 70 MG PO TABS: 70.0000 mg | ORAL_TABLET | ORAL | 3 refills | Status: DC

## 2018-09-14 NOTE — Assessment & Plan Note (Signed)
Reported adequate controlled BP> No changes in current management. Low salt diet.

## 2018-09-14 NOTE — Progress Notes (Signed)
Virtual Visit via Telephone Note  I connected with Danielle [name] on 09/14/18 at  9:30 AM EDT by telephone and verified that I am speaking with the correct person using two identifiers.  Tried video visit but connection failed.  I discussed the limitations, risks, security and privacy concerns of performing an evaluation and management service by telephone and the availability of in person appointments. I also discussed with the patient that there may be a patient responsible charge related to this service. The patient expressed understanding and agreed to proceed.  Location patient: home Location provider: home office Participants present for the call: patient, provider Patient did not have a visit in the prior 7 days to address this/these issue(s).   History of Present Illness: Ms Danielle [name] is a 73 yo female with Hx of AVR,COPD,CAD,atrial fib on Coumadin,hyperthyroidism,chronic pain,breast cancer s/p lumpectomy and radiation therapy following on recent hospitalization.  She was admitted on 08/28/18 because left breast cellulitis on area she received radiation. Dx with left breast cellulitis. Discharged on 09/01/18.  She presented to the ER because warm and erythematous area on left breast,no pain. She was treated for cellulitis on same breast a few weeks before admission,completed treatment or Doxycycline and problem resolved.  Started on Unasyl and Vancomycin, changed to IV Ceftriaxone. ID consultation before hospital discharge, she has a f/u appt on 09/18/18. Discharged with PICC line placed on 09/01/18 to start IV abx , Ceftriaxone 2 g IV 3 times per week, plan is discontinue abx on 09/19/18.  BCx x 2, not growth 5 days (09/04/18).  HTN,CAD,CHF: She is on Cozaar 50 mg and Coreg 25 mg bid. She is also on Furosemide 20 mg daily for LE edema. Negative for headache,CP,dyspnea,palpitations,or worsening edema. No orthopnea or PND.   Atrial fib, during hospitalization she  was on sinus rhythm.  She is on Comadin 3 mg daily x 5 and 1 mg x 1 (Wednesday). Last INR 2.3 on 09/12/18.  She already followed with Dr Annamaria Boots, BP was mildly elevated during OV, 155/87 on 09/08/18. Nurse is checking BP weekly and it has been "good." Nurse visits 1 x week. PT 1-2 x week. PY close to be completed. She thinks she may need more PT because does not feel like she is at her baseline. She is using a cane.  Requesting refills on Fosamax, which she takes weekly and has been well tolerated in 11/2015.   Observations/Objective: Patient sounds cheerful and well on the phone. I do not appreciate any SOB. Speech and thought processing are grossly intact. Patient reported vitals:N/A  Assessment and Plan:  Essential hypertension Reported adequate controlled BP> No changes in current management. Low salt diet.   CKD (chronic kidney disease), stage III (HCC) Cr 1.1-1.3 and eGFR high 50's, back to her baseline. Adequate BP controlled, continue Cozaar. Low salt diet. Adequate hydration and avoidance of NSAID's.  Cellulitis of left breast Great improvement. Complete IV Ceftriaxone 2 g 3 times per week. [place] nurse is checking PIC line weekly. Bcx x 2 no growth. Instructed about warning signs. Keep ID appt.     Osteoporosis Tolerating Fosamax well,no changes. Regular physical activity as tolerated.  Fall precautions. DEXA has been scheduled, ordered by Dr Bevelyn Buckles.    Follow Up Instructions: Planning on mammogram 7/28 and 11/2018.  I did not refer this patient for an OV in the next 24 hours for this/these issue(s).  I discussed the assessment and treatment plan with the patient. She was provided an opportunity to ask questions and  all were answered. She agreed with the plan and demonstrated an understanding of the instructions.   Return if symptoms worsen or fail to improve, for Keep f/u appt..   I provided 22 minutes of non-face-to-face time during this  encounter.   [name], MD

## 2018-09-14 NOTE — Assessment & Plan Note (Addendum)
Tolerating Fosamax well,no changes. Regular physical activity as tolerated.  Fall precautions. DEXA has been scheduled, ordered by Dr Bevelyn Buckles.

## 2018-09-14 NOTE — Assessment & Plan Note (Signed)
Great improvement. Complete IV Ceftriaxone 2 g 3 times per week. [place] nurse is checking PIC line weekly. Bcx x 2 no growth. Instructed about warning signs. Keep ID appt.

## 2018-09-14 NOTE — Assessment & Plan Note (Signed)
Cr 1.1-1.3 and eGFR high 50's, back to her baseline. Adequate BP controlled, continue Cozaar. Low salt diet. Adequate hydration and avoidance of NSAID's.

## 2018-09-18 ENCOUNTER — Ambulatory Visit (INDEPENDENT_AMBULATORY_CARE_PROVIDER_SITE_OTHER): Payer: Medicare Other | Admitting: Infectious Diseases

## 2018-09-18 ENCOUNTER — Encounter: Payer: Self-pay | Admitting: Infectious Diseases

## 2018-09-18 ENCOUNTER — Other Ambulatory Visit: Payer: Self-pay

## 2018-09-18 VITALS — BP 140/88 | HR 80 | Temp 98.0°F | Ht 64.0 in | Wt 253.0 lb

## 2018-09-18 DIAGNOSIS — A411 Sepsis due to other specified staphylococcus: Secondary | ICD-10-CM

## 2018-09-18 DIAGNOSIS — D696 Thrombocytopenia, unspecified: Secondary | ICD-10-CM | POA: Diagnosis not present

## 2018-09-18 DIAGNOSIS — N611 Abscess of the breast and nipple: Secondary | ICD-10-CM | POA: Diagnosis not present

## 2018-09-18 NOTE — Patient Instructions (Signed)
May d/c PICC on 09/20/2018 after rocephin dose that morning (may take rocephin dose at 8 AM that day).

## 2018-09-18 NOTE — Progress Notes (Signed)
Subjective:    Patient ID: Danielle [name], female    DOB: Nov 12, 1945, 73 y.o.   MRN: [id number]  HPI Pt is a 73 y/o AAF withh/o AVR and h/o stage IB LT breast CA, s/p lumpectomy in 2018 and adjuvant chemotherapy admitted with recurrent LT breast cellulitis vs. Abscess and possible CoNS BSI.  Her breast continues to feel better less swollen since leaving [facility] long hospital.  Follow-up labs while on IV antibiotics all look good.  Patient is without acute complaints today.   Past Medical History:  Diagnosis Date  . Allergy   . Anxiety   . Aortic stenosis    Status post bioprosthetic AVR  . Arthritis    DJD  . Asthma   . Breast cancer ([place]) 12/13/2016   Left breast  . Chronic systolic CHF (congestive heart failure) ([place])    EF 30% 04/2014  . COPD (chronic obstructive pulmonary disease) ([place])   . Coronary artery disease    per pt, had LHC prior to AVR in [place] that did not show any blockages; no stents/bypass  . Gastritis   . Hyperlipidemia   . Hypertension   . Hyperthyroidism   . Multiple thyroid nodules   . Osteoporosis   . Paroxysmal atrial fibrillation (HCC)   . Pelvis fracture ([place]) 08/19/2015   MULTIPLE   . Personal history of radiation therapy 2018  . Spontaneous pneumothorax 11/28/2015   left   . Stroke [facility]) 2000   rt hand weak    Past Surgical History:  Procedure Laterality Date  . BREAST LUMPECTOMY Left 12/2016  . BREAST LUMPECTOMY WITH RADIOACTIVE SEED AND SENTINEL LYMPH NODE BIOPSY Left 01/14/2017   Procedure: LEFT BREAST LUMPECTOMY WITH RADIOACTIVE SEED AND SENTINEL LYMPH NODE BIOPSY;  Surgeon: Rolm Bookbinder, MD;  Location: [place];  Service: General;  Laterality: Left;  . CHEST TUBE INSERTION  10/2015  . INTRAMEDULLARY (IM) NAIL INTERTROCHANTERIC Left 12/10/2015   Procedure: INTRAMEDULLARY (IM) NAIL INTERTROCHANTRIC;  Surgeon: Leandrew Koyanagi, MD;  Location: [place];  Service: Orthopedics;  Laterality: Left;  . PLEURADESIS Left 12/03/2015    Procedure: PLEURADESIS;  Surgeon: Melrose Nakayama, MD;  Location: [place];  Service: Thoracic;  Laterality: Left;  . RESECTION OF APICAL BLEB Left 12/03/2015   Procedure: BLEBECTOMY;  Surgeon: Melrose Nakayama, MD;  Location: [place];  Service: Thoracic;  Laterality: Left;  . THORACOSCOPY  12/03/2015  . VALVE REPLACEMENT    . VIDEO ASSISTED THORACOSCOPY Left 12/03/2015   Procedure: VIDEO ASSISTED THORACOSCOPY;  Surgeon: Melrose Nakayama, MD;  Location: [facility] OR;  Service: Thoracic;  Laterality: Left;     Family History  Problem Relation Age of Onset  . Diabetes Mother   . Heart attack Mother 25  . Diabetes Father   . Lung cancer Father   . Diabetes Sister   . Thyroid disease Sister   . Diabetes Sister   . HIV Brother      Social History   Tobacco Use  . Smoking status: Former Smoker    Packs/day: 0.25    Years: 10.00    Pack years: 2.50    Types: Cigarettes    Quit date: 03/01/2010    Years since quitting: 8.5  . Smokeless tobacco: Never Used  Substance Use Topics  . Alcohol use: No  . Drug use: No      reports previously being sexually active.   Outpatient Medications Prior to Visit  Medication Sig Dispense Refill  . acetaminophen (TYLENOL) 500 MG  tablet Take 1,000 mg by mouth daily as needed (PAIN).    Marland Kitchen albuterol (PROVENTIL HFA;VENTOLIN HFA) 108 (90 Base) MCG/ACT inhaler Inhale 2 puffs into the lungs every 6 (six) hours as needed for wheezing or shortness of breath.     Marland Kitchen alendronate (FOSAMAX) 70 MG tablet Take 1 tablet (70 mg total) by mouth once a week. Take with a full glass of water on an empty stomach. 13 tablet 3  . aspirin EC 81 MG tablet Take 81 mg daily by mouth.    . budesonide-formoterol (SYMBICORT) 160-4.5 MCG/ACT inhaler Inhale 2 puffs into the lungs 2 (two) times daily. 1 Inhaler 0  . carvedilol (COREG) 25 MG tablet Take 1 tablet by mouth twice daily 180 tablet 1  . cefTRIAXone (ROCEPHIN) IVPB Inject 2 g into the vein daily for 18 days. Indication:   Breast cellulitis and abscess  Last Day of Therapy:  09/19/2018 Labs - Once weekly:  CBC/D and BMP 18 Units 0  . Cholecalciferol (VITAMIN D3) 400 units tablet Take 1 tablet (400 Units total) by mouth daily. 30 tablet 0  . diclofenac sodium (VOLTAREN) 1 % GEL Apply 4 g topically 4 (four) times daily. 500 g 3  . diphenhydramine-acetaminophen (TYLENOL PM) 25-500 MG TABS tablet Take 2 tablets by mouth at bedtime.     . furosemide (LASIX) 20 MG tablet Take 1 tablet (20 mg total) by mouth daily. 90 tablet 2  . losartan (COZAAR) 50 MG tablet Take 1 tablet by mouth once daily 90 tablet 1  . methimazole (TAPAZOLE) 5 MG tablet Take 3 tablets (15 mg total) by mouth every evening. 90 tablet 2  . rosuvastatin (CRESTOR) 40 MG tablet Take 1 tablet by mouth daily 90 tablet 0  . senna-docusate (SENOKOT-S) 8.6-50 MG tablet Take 2 tablets at bedtime by mouth.     . tamoxifen (NOLVADEX) 20 MG tablet Take 1 tablet by mouth once daily 90 tablet 0  . traMADol (ULTRAM) 50 MG tablet Take 1 tablet (50 mg total) by mouth 3 (three) times daily. 90 tablet 5  . valACYclovir (VALTREX) 500 MG tablet Take 1 tablet (500 mg total) by mouth 2 (two) times daily. For 3 days when outbreaks. (Patient taking differently: Take 500 mg by mouth 2 (two) times daily as needed (outbreaks). For 3 days when outbreaks.) 24 tablet 1  . warfarin (COUMADIN) 1 MG tablet 1 tab wednesdays (Patient taking differently: Take 1 mg by mouth once a week. Take 1 tablet (1 mg) Only on Wednesday) 20 tablet 2  . warfarin (COUMADIN) 3 MG tablet TAKE AS DIRECTED BY  ANTICOAGULATION  CLINIC (Patient taking differently: Take 3 mg by mouth as directed. Take 1 tablet (3 mg) daily except on Wednesday. TAKE AS DIRECTED BY  ANTICOAGULATION  CLINIC) 90 tablet 1   No facility-administered medications prior to visit.      Allergies  Allergen Reactions  . Lisinopril Cough  . Tetanus Toxoid Adsorbed Swelling    Arm swelling      Review of Systems  Constitutional:  Positive for fatigue. Negative for chills and fever.  HENT: Negative for congestion, hearing loss and sinus pressure.   Eyes: Negative for photophobia, discharge, redness and visual disturbance.  Respiratory: Negative for apnea, cough, shortness of breath and wheezing.   Cardiovascular: Negative for chest pain and leg swelling.  Gastrointestinal: Negative for abdominal distention, abdominal pain, constipation, diarrhea, nausea and vomiting.  Endocrine: Negative for cold intolerance, heat intolerance, polydipsia and polyuria.  Genitourinary: Negative for dysuria,  flank pain, frequency, urgency, vaginal bleeding and vaginal discharge.  Musculoskeletal: Negative for arthralgias, back pain, joint swelling and neck pain.  Skin: Positive for wound. Negative for pallor and rash.  Allergic/Immunologic: Negative for immunocompromised state.  Neurological: Negative for dizziness, seizures, speech difficulty, weakness and headaches.  Hematological: Does not bruise/bleed easily.  Psychiatric/Behavioral: Negative for agitation, confusion, hallucinations and sleep disturbance. The patient is not nervous/anxious.        Objective:    Vitals:   09/18/18 0934  BP: 140/88  Pulse: 80  Temp: 98 F (36.7 C)   Physical Exam Gen: pleasant, NAD, A&Ox 3 Head: NCAT, no temporal wasting evident EENT: PERRL, EOMI, MMM, adequate dentition Neck: supple, no JVD CV: NRRR, no murmurs evident Pulm: CTA bilaterally, no wheeze or retractions Abd: soft, NTND, +BS Extrems: trace LE edema, 2+ pulses Skin: no rashes, adequate skin turgor Neuro: CN II-XII grossly intact, no focal neurologic deficits appreciated, gait was not assessed, A&Ox 3   Labs: Lab Results  Component Value Date   WBC 4.7 09/08/2018   HGB 12.0 09/08/2018   HCT 37.9 09/08/2018   MCV 96.2 09/08/2018   PLT 102 (L) 09/08/2018   Lab Results  Component Value Date   NA 140 09/08/2018   K 4.3 09/08/2018   CL 110 09/08/2018   CO2 23 09/08/2018    GLUCOSE 92 09/08/2018   BUN 12 09/08/2018   CREATININE 1.11 (H) 09/08/2018   CALCIUM 9.2 09/08/2018   MG 2.1 08/28/2018   Lab Results  Component Value Date   CRP 2.0 (H) 08/28/2018       Assessment & Plan:  Pt is a 73 y/o AAF withh/o AVR and h/o stage IB LT breast CA, s/p lumpectomy in 2018 and adjuvant chemotherapy admitted with recurrent LT breast cellulitis vs. Abscess and possible CoNS BSI.  1.Recurrent LT breast cellulitis - changed vanc to rocephin given blood cx results. General surgeon wishes to defer intervention at this time despite reidual fluid collection noted 2 years post-operatively from her lumpectomy. She denies any recent direct or blunt trauma to her breast over the past month but does admit to anxiety re: covid-19 pandemic and self-quarantining. She admits she may have scratched to her former lumpectomy incision site prior to the onset of sxs. Concern she truly had a LT breast abscess as streaking erythema overlying her former lumpectomy site would be more suspicious for this than simple residual seroma. Given robust response to rocephin, h/o AVR, and relapse shortly after taking PO ABX, will plan to place PICC tomorrow and continue rocephin 2 gm IV daily for 3 week duration. Tentative ABX d/c date is 09/19/2018.  2. 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 F/u repeat blood cxs were negative, indicating contamination to first set likely.

## 2018-09-18 NOTE — Progress Notes (Signed)
[facility] to [facility] and gave verbal orders to [facility] per Dr. Prince Rome to remove Picc line after last AM dose on 09/20/18. Verbal orders read back and understood. Patient made aware during office visit today. Eugenia Mcalpine, LPN

## 2018-09-19 ENCOUNTER — Telehealth: Payer: Self-pay

## 2018-09-19 DIAGNOSIS — K8020 Calculus of gallbladder without cholecystitis without obstruction: Secondary | ICD-10-CM | POA: Diagnosis not present

## 2018-09-19 DIAGNOSIS — I480 Paroxysmal atrial fibrillation: Secondary | ICD-10-CM | POA: Diagnosis not present

## 2018-09-19 DIAGNOSIS — I5022 Chronic systolic (congestive) heart failure: Secondary | ICD-10-CM | POA: Diagnosis not present

## 2018-09-19 DIAGNOSIS — I2510 Atherosclerotic heart disease of native coronary artery without angina pectoris: Secondary | ICD-10-CM | POA: Diagnosis not present

## 2018-09-19 DIAGNOSIS — M5136 Other intervertebral disc degeneration, lumbar region: Secondary | ICD-10-CM | POA: Diagnosis not present

## 2018-09-19 DIAGNOSIS — R7881 Bacteremia: Secondary | ICD-10-CM | POA: Diagnosis not present

## 2018-09-19 DIAGNOSIS — C50412 Malignant neoplasm of upper-outer quadrant of left female breast: Secondary | ICD-10-CM | POA: Diagnosis not present

## 2018-09-19 DIAGNOSIS — D631 Anemia in chronic kidney disease: Secondary | ICD-10-CM | POA: Diagnosis not present

## 2018-09-19 DIAGNOSIS — I69351 Hemiplegia and hemiparesis following cerebral infarction affecting right dominant side: Secondary | ICD-10-CM | POA: Diagnosis not present

## 2018-09-19 DIAGNOSIS — B9689 Other specified bacterial agents as the cause of diseases classified elsewhere: Secondary | ICD-10-CM | POA: Diagnosis not present

## 2018-09-19 DIAGNOSIS — K5730 Diverticulosis of large intestine without perforation or abscess without bleeding: Secondary | ICD-10-CM | POA: Diagnosis not present

## 2018-09-19 DIAGNOSIS — J449 Chronic obstructive pulmonary disease, unspecified: Secondary | ICD-10-CM | POA: Diagnosis not present

## 2018-09-19 DIAGNOSIS — K2970 Gastritis, unspecified, without bleeding: Secondary | ICD-10-CM | POA: Diagnosis not present

## 2018-09-19 DIAGNOSIS — L03311 Cellulitis of abdominal wall: Secondary | ICD-10-CM | POA: Diagnosis not present

## 2018-09-19 DIAGNOSIS — I130 Hypertensive heart and chronic kidney disease with heart failure and stage 1 through stage 4 chronic kidney disease, or unspecified chronic kidney disease: Secondary | ICD-10-CM | POA: Diagnosis not present

## 2018-09-19 DIAGNOSIS — N183 Chronic kidney disease, stage 3 (moderate): Secondary | ICD-10-CM | POA: Diagnosis not present

## 2018-09-19 DIAGNOSIS — D696 Thrombocytopenia, unspecified: Secondary | ICD-10-CM | POA: Diagnosis not present

## 2018-09-19 DIAGNOSIS — Z452 Encounter for adjustment and management of vascular access device: Secondary | ICD-10-CM | POA: Diagnosis not present

## 2018-09-19 DIAGNOSIS — N61 Mastitis without abscess: Secondary | ICD-10-CM | POA: Diagnosis not present

## 2018-09-19 DIAGNOSIS — I723 Aneurysm of iliac artery: Secondary | ICD-10-CM | POA: Diagnosis not present

## 2018-09-19 DIAGNOSIS — M150 Primary generalized (osteo)arthritis: Secondary | ICD-10-CM | POA: Diagnosis not present

## 2018-09-19 DIAGNOSIS — K449 Diaphragmatic hernia without obstruction or gangrene: Secondary | ICD-10-CM | POA: Diagnosis not present

## 2018-09-19 DIAGNOSIS — I428 Other cardiomyopathies: Secondary | ICD-10-CM | POA: Diagnosis not present

## 2018-09-19 DIAGNOSIS — D630 Anemia in neoplastic disease: Secondary | ICD-10-CM | POA: Diagnosis not present

## 2018-09-19 DIAGNOSIS — I700 Atherosclerosis of aorta: Secondary | ICD-10-CM | POA: Diagnosis not present

## 2018-09-19 NOTE — Telephone Encounter (Signed)
Danielle [name] , [facility] with [facility] calling regarding stop date of IV antibiotics should have been stopped today.  Nurse is at patients home to remove PICC and patient stated the stop date was changed by Dr. Prince Rome during her visit on 09-18-2018.   Per documentation from 09-18-18 by Cathie Beams, [place] working with Dr Prince Rome call made to [facility], [facility] with [facility] to extend IV medications to 09-20-2018.  I spoke with Dr Prince Rome who verified the stop date as 09-20-2018.   Laverle Patter, RN

## 2018-09-20 DIAGNOSIS — I2510 Atherosclerotic heart disease of native coronary artery without angina pectoris: Secondary | ICD-10-CM | POA: Diagnosis not present

## 2018-09-20 DIAGNOSIS — L0291 Cutaneous abscess, unspecified: Secondary | ICD-10-CM | POA: Diagnosis not present

## 2018-09-20 DIAGNOSIS — C50412 Malignant neoplasm of upper-outer quadrant of left female breast: Secondary | ICD-10-CM | POA: Diagnosis not present

## 2018-09-20 DIAGNOSIS — I480 Paroxysmal atrial fibrillation: Secondary | ICD-10-CM | POA: Diagnosis not present

## 2018-09-20 DIAGNOSIS — B9689 Other specified bacterial agents as the cause of diseases classified elsewhere: Secondary | ICD-10-CM | POA: Diagnosis not present

## 2018-09-20 DIAGNOSIS — K8020 Calculus of gallbladder without cholecystitis without obstruction: Secondary | ICD-10-CM | POA: Diagnosis not present

## 2018-09-20 DIAGNOSIS — I69351 Hemiplegia and hemiparesis following cerebral infarction affecting right dominant side: Secondary | ICD-10-CM | POA: Diagnosis not present

## 2018-09-20 DIAGNOSIS — K2970 Gastritis, unspecified, without bleeding: Secondary | ICD-10-CM | POA: Diagnosis not present

## 2018-09-20 DIAGNOSIS — N183 Chronic kidney disease, stage 3 (moderate): Secondary | ICD-10-CM | POA: Diagnosis not present

## 2018-09-20 DIAGNOSIS — R7881 Bacteremia: Secondary | ICD-10-CM | POA: Diagnosis not present

## 2018-09-20 DIAGNOSIS — M5136 Other intervertebral disc degeneration, lumbar region: Secondary | ICD-10-CM | POA: Diagnosis not present

## 2018-09-20 DIAGNOSIS — I428 Other cardiomyopathies: Secondary | ICD-10-CM | POA: Diagnosis not present

## 2018-09-20 DIAGNOSIS — M150 Primary generalized (osteo)arthritis: Secondary | ICD-10-CM | POA: Diagnosis not present

## 2018-09-20 DIAGNOSIS — Z452 Encounter for adjustment and management of vascular access device: Secondary | ICD-10-CM | POA: Diagnosis not present

## 2018-09-20 DIAGNOSIS — D631 Anemia in chronic kidney disease: Secondary | ICD-10-CM | POA: Diagnosis not present

## 2018-09-20 DIAGNOSIS — D630 Anemia in neoplastic disease: Secondary | ICD-10-CM | POA: Diagnosis not present

## 2018-09-20 DIAGNOSIS — K449 Diaphragmatic hernia without obstruction or gangrene: Secondary | ICD-10-CM | POA: Diagnosis not present

## 2018-09-20 DIAGNOSIS — D696 Thrombocytopenia, unspecified: Secondary | ICD-10-CM | POA: Diagnosis not present

## 2018-09-20 DIAGNOSIS — I5022 Chronic systolic (congestive) heart failure: Secondary | ICD-10-CM | POA: Diagnosis not present

## 2018-09-20 DIAGNOSIS — L03311 Cellulitis of abdominal wall: Secondary | ICD-10-CM | POA: Diagnosis not present

## 2018-09-20 DIAGNOSIS — N61 Mastitis without abscess: Secondary | ICD-10-CM | POA: Diagnosis not present

## 2018-09-20 DIAGNOSIS — I130 Hypertensive heart and chronic kidney disease with heart failure and stage 1 through stage 4 chronic kidney disease, or unspecified chronic kidney disease: Secondary | ICD-10-CM | POA: Diagnosis not present

## 2018-09-20 DIAGNOSIS — J449 Chronic obstructive pulmonary disease, unspecified: Secondary | ICD-10-CM | POA: Diagnosis not present

## 2018-09-20 DIAGNOSIS — I723 Aneurysm of iliac artery: Secondary | ICD-10-CM | POA: Diagnosis not present

## 2018-09-20 DIAGNOSIS — I700 Atherosclerosis of aorta: Secondary | ICD-10-CM | POA: Diagnosis not present

## 2018-09-20 DIAGNOSIS — K5730 Diverticulosis of large intestine without perforation or abscess without bleeding: Secondary | ICD-10-CM | POA: Diagnosis not present

## 2018-09-21 ENCOUNTER — Ambulatory Visit (INDEPENDENT_AMBULATORY_CARE_PROVIDER_SITE_OTHER): Payer: Medicare Other | Admitting: General Practice

## 2018-09-21 DIAGNOSIS — Z7901 Long term (current) use of anticoagulants: Secondary | ICD-10-CM | POA: Diagnosis not present

## 2018-09-21 LAB — POCT INR: INR: 2.3 (ref 2.0–3.0)

## 2018-09-21 NOTE — Patient Instructions (Signed)
Pre visit review using our clinic review tool, if applicable. No additional management support is needed unless otherwise documented below in the visit note.  Continue 3 mg daily except 1.5 mg on Wed.  Re-check in 1 week.  Dosing instructions given to Inez Catalina, RN @ [facility] while in patient's home.  Betty's # [phone].  Patient will be discharged from home health next week.

## 2018-09-25 DIAGNOSIS — D631 Anemia in chronic kidney disease: Secondary | ICD-10-CM | POA: Diagnosis not present

## 2018-09-25 DIAGNOSIS — B9689 Other specified bacterial agents as the cause of diseases classified elsewhere: Secondary | ICD-10-CM | POA: Diagnosis not present

## 2018-09-25 DIAGNOSIS — I700 Atherosclerosis of aorta: Secondary | ICD-10-CM | POA: Diagnosis not present

## 2018-09-25 DIAGNOSIS — J449 Chronic obstructive pulmonary disease, unspecified: Secondary | ICD-10-CM | POA: Diagnosis not present

## 2018-09-25 DIAGNOSIS — M5136 Other intervertebral disc degeneration, lumbar region: Secondary | ICD-10-CM | POA: Diagnosis not present

## 2018-09-25 DIAGNOSIS — K449 Diaphragmatic hernia without obstruction or gangrene: Secondary | ICD-10-CM | POA: Diagnosis not present

## 2018-09-25 DIAGNOSIS — I130 Hypertensive heart and chronic kidney disease with heart failure and stage 1 through stage 4 chronic kidney disease, or unspecified chronic kidney disease: Secondary | ICD-10-CM | POA: Diagnosis not present

## 2018-09-25 DIAGNOSIS — I428 Other cardiomyopathies: Secondary | ICD-10-CM | POA: Diagnosis not present

## 2018-09-25 DIAGNOSIS — K2970 Gastritis, unspecified, without bleeding: Secondary | ICD-10-CM | POA: Diagnosis not present

## 2018-09-25 DIAGNOSIS — I480 Paroxysmal atrial fibrillation: Secondary | ICD-10-CM | POA: Diagnosis not present

## 2018-09-25 DIAGNOSIS — I69351 Hemiplegia and hemiparesis following cerebral infarction affecting right dominant side: Secondary | ICD-10-CM | POA: Diagnosis not present

## 2018-09-25 DIAGNOSIS — K5730 Diverticulosis of large intestine without perforation or abscess without bleeding: Secondary | ICD-10-CM | POA: Diagnosis not present

## 2018-09-25 DIAGNOSIS — Z452 Encounter for adjustment and management of vascular access device: Secondary | ICD-10-CM | POA: Diagnosis not present

## 2018-09-25 DIAGNOSIS — R7881 Bacteremia: Secondary | ICD-10-CM | POA: Diagnosis not present

## 2018-09-25 DIAGNOSIS — I723 Aneurysm of iliac artery: Secondary | ICD-10-CM | POA: Diagnosis not present

## 2018-09-25 DIAGNOSIS — D630 Anemia in neoplastic disease: Secondary | ICD-10-CM | POA: Diagnosis not present

## 2018-09-25 DIAGNOSIS — N183 Chronic kidney disease, stage 3 (moderate): Secondary | ICD-10-CM | POA: Diagnosis not present

## 2018-09-25 DIAGNOSIS — I5022 Chronic systolic (congestive) heart failure: Secondary | ICD-10-CM | POA: Diagnosis not present

## 2018-09-25 DIAGNOSIS — L03311 Cellulitis of abdominal wall: Secondary | ICD-10-CM | POA: Diagnosis not present

## 2018-09-25 DIAGNOSIS — N61 Mastitis without abscess: Secondary | ICD-10-CM | POA: Diagnosis not present

## 2018-09-25 DIAGNOSIS — I2510 Atherosclerotic heart disease of native coronary artery without angina pectoris: Secondary | ICD-10-CM | POA: Diagnosis not present

## 2018-09-25 DIAGNOSIS — C50412 Malignant neoplasm of upper-outer quadrant of left female breast: Secondary | ICD-10-CM | POA: Diagnosis not present

## 2018-09-25 DIAGNOSIS — D696 Thrombocytopenia, unspecified: Secondary | ICD-10-CM | POA: Diagnosis not present

## 2018-09-25 DIAGNOSIS — M150 Primary generalized (osteo)arthritis: Secondary | ICD-10-CM | POA: Diagnosis not present

## 2018-09-25 DIAGNOSIS — K8020 Calculus of gallbladder without cholecystitis without obstruction: Secondary | ICD-10-CM | POA: Diagnosis not present

## 2018-09-26 ENCOUNTER — Ambulatory Visit
Admission: RE | Admit: 2018-09-26 | Discharge: 2018-09-26 | Disposition: A | Discharge status: Home / Self Care | Payer: Medicare Other | Source: Ambulatory Visit | Attending: Hematology | Admitting: Hematology

## 2018-09-26 ENCOUNTER — Other Ambulatory Visit: Payer: Self-pay

## 2018-09-26 ENCOUNTER — Other Ambulatory Visit: Payer: Self-pay | Admitting: Hematology

## 2018-09-26 ENCOUNTER — Ambulatory Visit
Admission: RE | Admit: 2018-09-26 | Discharge: 2018-09-26 | Disposition: A | Discharge status: Home / Self Care | Payer: Medicare Other | Source: Ambulatory Visit | Attending: Internal Medicine | Admitting: Internal Medicine

## 2018-09-26 ENCOUNTER — Other Ambulatory Visit: Payer: Self-pay | Admitting: Internal Medicine

## 2018-09-26 DIAGNOSIS — N61 Mastitis without abscess: Secondary | ICD-10-CM

## 2018-09-26 DIAGNOSIS — Z853 Personal history of malignant neoplasm of breast: Secondary | ICD-10-CM | POA: Diagnosis not present

## 2018-09-26 DIAGNOSIS — C50412 Malignant neoplasm of upper-outer quadrant of left female breast: Secondary | ICD-10-CM

## 2018-09-26 DIAGNOSIS — R922 Inconclusive mammogram: Secondary | ICD-10-CM | POA: Diagnosis not present

## 2018-09-26 DIAGNOSIS — Z170 Estrogen receptor positive status [ER+]: Secondary | ICD-10-CM

## 2018-09-27 ENCOUNTER — Telehealth: Payer: Self-pay | Admitting: Family Medicine

## 2018-09-27 DIAGNOSIS — K2970 Gastritis, unspecified, without bleeding: Secondary | ICD-10-CM | POA: Diagnosis not present

## 2018-09-27 DIAGNOSIS — Z452 Encounter for adjustment and management of vascular access device: Secondary | ICD-10-CM | POA: Diagnosis not present

## 2018-09-27 DIAGNOSIS — B9689 Other specified bacterial agents as the cause of diseases classified elsewhere: Secondary | ICD-10-CM | POA: Diagnosis not present

## 2018-09-27 DIAGNOSIS — M5136 Other intervertebral disc degeneration, lumbar region: Secondary | ICD-10-CM | POA: Diagnosis not present

## 2018-09-27 DIAGNOSIS — D696 Thrombocytopenia, unspecified: Secondary | ICD-10-CM | POA: Diagnosis not present

## 2018-09-27 DIAGNOSIS — I723 Aneurysm of iliac artery: Secondary | ICD-10-CM | POA: Diagnosis not present

## 2018-09-27 DIAGNOSIS — N61 Mastitis without abscess: Secondary | ICD-10-CM | POA: Diagnosis not present

## 2018-09-27 DIAGNOSIS — N183 Chronic kidney disease, stage 3 (moderate): Secondary | ICD-10-CM | POA: Diagnosis not present

## 2018-09-27 DIAGNOSIS — I2510 Atherosclerotic heart disease of native coronary artery without angina pectoris: Secondary | ICD-10-CM | POA: Diagnosis not present

## 2018-09-27 DIAGNOSIS — K5730 Diverticulosis of large intestine without perforation or abscess without bleeding: Secondary | ICD-10-CM | POA: Diagnosis not present

## 2018-09-27 DIAGNOSIS — L03311 Cellulitis of abdominal wall: Secondary | ICD-10-CM | POA: Diagnosis not present

## 2018-09-27 DIAGNOSIS — R7881 Bacteremia: Secondary | ICD-10-CM | POA: Diagnosis not present

## 2018-09-27 DIAGNOSIS — I69351 Hemiplegia and hemiparesis following cerebral infarction affecting right dominant side: Secondary | ICD-10-CM | POA: Diagnosis not present

## 2018-09-27 DIAGNOSIS — D630 Anemia in neoplastic disease: Secondary | ICD-10-CM | POA: Diagnosis not present

## 2018-09-27 DIAGNOSIS — C50412 Malignant neoplasm of upper-outer quadrant of left female breast: Secondary | ICD-10-CM | POA: Diagnosis not present

## 2018-09-27 DIAGNOSIS — K8020 Calculus of gallbladder without cholecystitis without obstruction: Secondary | ICD-10-CM | POA: Diagnosis not present

## 2018-09-27 DIAGNOSIS — I428 Other cardiomyopathies: Secondary | ICD-10-CM | POA: Diagnosis not present

## 2018-09-27 DIAGNOSIS — I5022 Chronic systolic (congestive) heart failure: Secondary | ICD-10-CM | POA: Diagnosis not present

## 2018-09-27 DIAGNOSIS — I700 Atherosclerosis of aorta: Secondary | ICD-10-CM | POA: Diagnosis not present

## 2018-09-27 DIAGNOSIS — J449 Chronic obstructive pulmonary disease, unspecified: Secondary | ICD-10-CM | POA: Diagnosis not present

## 2018-09-27 DIAGNOSIS — I480 Paroxysmal atrial fibrillation: Secondary | ICD-10-CM | POA: Diagnosis not present

## 2018-09-27 DIAGNOSIS — I130 Hypertensive heart and chronic kidney disease with heart failure and stage 1 through stage 4 chronic kidney disease, or unspecified chronic kidney disease: Secondary | ICD-10-CM | POA: Diagnosis not present

## 2018-09-27 DIAGNOSIS — M150 Primary generalized (osteo)arthritis: Secondary | ICD-10-CM | POA: Diagnosis not present

## 2018-09-27 DIAGNOSIS — D631 Anemia in chronic kidney disease: Secondary | ICD-10-CM | POA: Diagnosis not present

## 2018-09-27 DIAGNOSIS — K449 Diaphragmatic hernia without obstruction or gangrene: Secondary | ICD-10-CM | POA: Diagnosis not present

## 2018-09-27 NOTE — Telephone Encounter (Signed)
Danielle Catalina, RN with Danielle [name] called in to give INR result for today.  INR- 2.0 pt is currently taking 3 MG every day except Wednesdays on Wednesdays she is taking 1MG .    Danielle [name] phone: [phone]

## 2018-10-02 NOTE — Telephone Encounter (Signed)
Noted  

## 2018-10-03 NOTE — Telephone Encounter (Signed)
Noted  

## 2018-10-05 ENCOUNTER — Telehealth: Payer: Self-pay | Admitting: Family Medicine

## 2018-10-05 NOTE — Telephone Encounter (Signed)
Home Health Verbal Orders - Caller/Agency: Sree from [facility] Number: [phone], [place] to leave a message Requesting OT/PT/Skilled Nursing/Social Work/Speech Therapy: PT Frequency: extend one time a week for five weeks.

## 2018-10-05 NOTE — Telephone Encounter (Signed)
Spoke with Barry and gave verbal orders as requested.

## 2018-10-05 NOTE — Telephone Encounter (Signed)
See note

## 2018-10-06 DIAGNOSIS — I130 Hypertensive heart and chronic kidney disease with heart failure and stage 1 through stage 4 chronic kidney disease, or unspecified chronic kidney disease: Secondary | ICD-10-CM | POA: Diagnosis not present

## 2018-10-06 DIAGNOSIS — L03311 Cellulitis of abdominal wall: Secondary | ICD-10-CM | POA: Diagnosis not present

## 2018-10-06 DIAGNOSIS — K8020 Calculus of gallbladder without cholecystitis without obstruction: Secondary | ICD-10-CM | POA: Diagnosis not present

## 2018-10-06 DIAGNOSIS — D631 Anemia in chronic kidney disease: Secondary | ICD-10-CM | POA: Diagnosis not present

## 2018-10-06 DIAGNOSIS — D696 Thrombocytopenia, unspecified: Secondary | ICD-10-CM | POA: Diagnosis not present

## 2018-10-06 DIAGNOSIS — K2970 Gastritis, unspecified, without bleeding: Secondary | ICD-10-CM | POA: Diagnosis not present

## 2018-10-06 DIAGNOSIS — N183 Chronic kidney disease, stage 3 (moderate): Secondary | ICD-10-CM | POA: Diagnosis not present

## 2018-10-06 DIAGNOSIS — I2510 Atherosclerotic heart disease of native coronary artery without angina pectoris: Secondary | ICD-10-CM | POA: Diagnosis not present

## 2018-10-06 DIAGNOSIS — Z452 Encounter for adjustment and management of vascular access device: Secondary | ICD-10-CM | POA: Diagnosis not present

## 2018-10-06 DIAGNOSIS — I723 Aneurysm of iliac artery: Secondary | ICD-10-CM | POA: Diagnosis not present

## 2018-10-06 DIAGNOSIS — I428 Other cardiomyopathies: Secondary | ICD-10-CM | POA: Diagnosis not present

## 2018-10-06 DIAGNOSIS — M150 Primary generalized (osteo)arthritis: Secondary | ICD-10-CM | POA: Diagnosis not present

## 2018-10-06 DIAGNOSIS — R7881 Bacteremia: Secondary | ICD-10-CM | POA: Diagnosis not present

## 2018-10-06 DIAGNOSIS — B9689 Other specified bacterial agents as the cause of diseases classified elsewhere: Secondary | ICD-10-CM | POA: Diagnosis not present

## 2018-10-06 DIAGNOSIS — N61 Mastitis without abscess: Secondary | ICD-10-CM | POA: Diagnosis not present

## 2018-10-06 DIAGNOSIS — K5730 Diverticulosis of large intestine without perforation or abscess without bleeding: Secondary | ICD-10-CM | POA: Diagnosis not present

## 2018-10-06 DIAGNOSIS — I69351 Hemiplegia and hemiparesis following cerebral infarction affecting right dominant side: Secondary | ICD-10-CM | POA: Diagnosis not present

## 2018-10-06 DIAGNOSIS — J449 Chronic obstructive pulmonary disease, unspecified: Secondary | ICD-10-CM | POA: Diagnosis not present

## 2018-10-06 DIAGNOSIS — K449 Diaphragmatic hernia without obstruction or gangrene: Secondary | ICD-10-CM | POA: Diagnosis not present

## 2018-10-06 DIAGNOSIS — D630 Anemia in neoplastic disease: Secondary | ICD-10-CM | POA: Diagnosis not present

## 2018-10-06 DIAGNOSIS — I480 Paroxysmal atrial fibrillation: Secondary | ICD-10-CM | POA: Diagnosis not present

## 2018-10-06 DIAGNOSIS — I5022 Chronic systolic (congestive) heart failure: Secondary | ICD-10-CM | POA: Diagnosis not present

## 2018-10-06 DIAGNOSIS — M5136 Other intervertebral disc degeneration, lumbar region: Secondary | ICD-10-CM | POA: Diagnosis not present

## 2018-10-06 DIAGNOSIS — I700 Atherosclerosis of aorta: Secondary | ICD-10-CM | POA: Diagnosis not present

## 2018-10-06 DIAGNOSIS — C50412 Malignant neoplasm of upper-outer quadrant of left female breast: Secondary | ICD-10-CM | POA: Diagnosis not present

## 2018-10-11 DIAGNOSIS — I5022 Chronic systolic (congestive) heart failure: Secondary | ICD-10-CM | POA: Diagnosis not present

## 2018-10-11 DIAGNOSIS — K449 Diaphragmatic hernia without obstruction or gangrene: Secondary | ICD-10-CM | POA: Diagnosis not present

## 2018-10-11 DIAGNOSIS — N61 Mastitis without abscess: Secondary | ICD-10-CM | POA: Diagnosis not present

## 2018-10-11 DIAGNOSIS — I700 Atherosclerosis of aorta: Secondary | ICD-10-CM | POA: Diagnosis not present

## 2018-10-11 DIAGNOSIS — D631 Anemia in chronic kidney disease: Secondary | ICD-10-CM | POA: Diagnosis not present

## 2018-10-11 DIAGNOSIS — L03311 Cellulitis of abdominal wall: Secondary | ICD-10-CM | POA: Diagnosis not present

## 2018-10-11 DIAGNOSIS — B9689 Other specified bacterial agents as the cause of diseases classified elsewhere: Secondary | ICD-10-CM | POA: Diagnosis not present

## 2018-10-11 DIAGNOSIS — K5730 Diverticulosis of large intestine without perforation or abscess without bleeding: Secondary | ICD-10-CM | POA: Diagnosis not present

## 2018-10-11 DIAGNOSIS — I130 Hypertensive heart and chronic kidney disease with heart failure and stage 1 through stage 4 chronic kidney disease, or unspecified chronic kidney disease: Secondary | ICD-10-CM | POA: Diagnosis not present

## 2018-10-11 DIAGNOSIS — J449 Chronic obstructive pulmonary disease, unspecified: Secondary | ICD-10-CM | POA: Diagnosis not present

## 2018-10-11 DIAGNOSIS — D630 Anemia in neoplastic disease: Secondary | ICD-10-CM | POA: Diagnosis not present

## 2018-10-11 DIAGNOSIS — M150 Primary generalized (osteo)arthritis: Secondary | ICD-10-CM | POA: Diagnosis not present

## 2018-10-11 DIAGNOSIS — I428 Other cardiomyopathies: Secondary | ICD-10-CM | POA: Diagnosis not present

## 2018-10-11 DIAGNOSIS — I2510 Atherosclerotic heart disease of native coronary artery without angina pectoris: Secondary | ICD-10-CM | POA: Diagnosis not present

## 2018-10-11 DIAGNOSIS — N183 Chronic kidney disease, stage 3 (moderate): Secondary | ICD-10-CM | POA: Diagnosis not present

## 2018-10-11 DIAGNOSIS — K2970 Gastritis, unspecified, without bleeding: Secondary | ICD-10-CM | POA: Diagnosis not present

## 2018-10-11 DIAGNOSIS — C50412 Malignant neoplasm of upper-outer quadrant of left female breast: Secondary | ICD-10-CM | POA: Diagnosis not present

## 2018-10-11 DIAGNOSIS — D696 Thrombocytopenia, unspecified: Secondary | ICD-10-CM | POA: Diagnosis not present

## 2018-10-11 DIAGNOSIS — I69351 Hemiplegia and hemiparesis following cerebral infarction affecting right dominant side: Secondary | ICD-10-CM | POA: Diagnosis not present

## 2018-10-11 DIAGNOSIS — M5136 Other intervertebral disc degeneration, lumbar region: Secondary | ICD-10-CM | POA: Diagnosis not present

## 2018-10-11 DIAGNOSIS — Z452 Encounter for adjustment and management of vascular access device: Secondary | ICD-10-CM | POA: Diagnosis not present

## 2018-10-11 DIAGNOSIS — I723 Aneurysm of iliac artery: Secondary | ICD-10-CM | POA: Diagnosis not present

## 2018-10-11 DIAGNOSIS — R7881 Bacteremia: Secondary | ICD-10-CM | POA: Diagnosis not present

## 2018-10-11 DIAGNOSIS — I480 Paroxysmal atrial fibrillation: Secondary | ICD-10-CM | POA: Diagnosis not present

## 2018-10-11 DIAGNOSIS — K8020 Calculus of gallbladder without cholecystitis without obstruction: Secondary | ICD-10-CM | POA: Diagnosis not present

## 2018-10-12 ENCOUNTER — Telehealth: Payer: Self-pay | Admitting: Family Medicine

## 2018-10-12 NOTE — Telephone Encounter (Signed)
Copied from [place] [phone]. Topic: General - Inquiry >> Oct 12, 2018 12:14 PM Danielle [name], [place] wrote: Reason for CRM: Patient called in stating she is wanting some clarification in regards to home health care. Please advise. Call back is [phone].

## 2018-10-13 NOTE — Telephone Encounter (Signed)
Please arrange appointment with Coumadin clinic to continue INR check. Thanks, BJ

## 2018-10-13 NOTE — Telephone Encounter (Signed)
Message sent to Dr. [name] for review. Please advise 

## 2018-10-13 NOTE — Telephone Encounter (Signed)
Pt calling to find out when her next INR will be.  States Danielle [name] was doing them but now they are not.

## 2018-10-13 NOTE — Telephone Encounter (Signed)
See note

## 2018-10-16 NOTE — Telephone Encounter (Signed)
Pt has been set up an appt for the 19th

## 2018-10-18 ENCOUNTER — Ambulatory Visit: Payer: Medicare Other

## 2018-10-19 DIAGNOSIS — K5730 Diverticulosis of large intestine without perforation or abscess without bleeding: Secondary | ICD-10-CM | POA: Diagnosis not present

## 2018-10-19 DIAGNOSIS — L03311 Cellulitis of abdominal wall: Secondary | ICD-10-CM | POA: Diagnosis not present

## 2018-10-19 DIAGNOSIS — I480 Paroxysmal atrial fibrillation: Secondary | ICD-10-CM | POA: Diagnosis not present

## 2018-10-19 DIAGNOSIS — Z452 Encounter for adjustment and management of vascular access device: Secondary | ICD-10-CM | POA: Diagnosis not present

## 2018-10-19 DIAGNOSIS — K449 Diaphragmatic hernia without obstruction or gangrene: Secondary | ICD-10-CM | POA: Diagnosis not present

## 2018-10-19 DIAGNOSIS — I700 Atherosclerosis of aorta: Secondary | ICD-10-CM | POA: Diagnosis not present

## 2018-10-19 DIAGNOSIS — D631 Anemia in chronic kidney disease: Secondary | ICD-10-CM | POA: Diagnosis not present

## 2018-10-19 DIAGNOSIS — I69351 Hemiplegia and hemiparesis following cerebral infarction affecting right dominant side: Secondary | ICD-10-CM | POA: Diagnosis not present

## 2018-10-19 DIAGNOSIS — I723 Aneurysm of iliac artery: Secondary | ICD-10-CM | POA: Diagnosis not present

## 2018-10-19 DIAGNOSIS — N183 Chronic kidney disease, stage 3 (moderate): Secondary | ICD-10-CM | POA: Diagnosis not present

## 2018-10-19 DIAGNOSIS — I130 Hypertensive heart and chronic kidney disease with heart failure and stage 1 through stage 4 chronic kidney disease, or unspecified chronic kidney disease: Secondary | ICD-10-CM | POA: Diagnosis not present

## 2018-10-19 DIAGNOSIS — B9689 Other specified bacterial agents as the cause of diseases classified elsewhere: Secondary | ICD-10-CM | POA: Diagnosis not present

## 2018-10-19 DIAGNOSIS — M5136 Other intervertebral disc degeneration, lumbar region: Secondary | ICD-10-CM | POA: Diagnosis not present

## 2018-10-19 DIAGNOSIS — K8020 Calculus of gallbladder without cholecystitis without obstruction: Secondary | ICD-10-CM | POA: Diagnosis not present

## 2018-10-19 DIAGNOSIS — R7881 Bacteremia: Secondary | ICD-10-CM | POA: Diagnosis not present

## 2018-10-19 DIAGNOSIS — M150 Primary generalized (osteo)arthritis: Secondary | ICD-10-CM | POA: Diagnosis not present

## 2018-10-19 DIAGNOSIS — K2970 Gastritis, unspecified, without bleeding: Secondary | ICD-10-CM | POA: Diagnosis not present

## 2018-10-19 DIAGNOSIS — C50412 Malignant neoplasm of upper-outer quadrant of left female breast: Secondary | ICD-10-CM | POA: Diagnosis not present

## 2018-10-19 DIAGNOSIS — I5022 Chronic systolic (congestive) heart failure: Secondary | ICD-10-CM | POA: Diagnosis not present

## 2018-10-19 DIAGNOSIS — J449 Chronic obstructive pulmonary disease, unspecified: Secondary | ICD-10-CM | POA: Diagnosis not present

## 2018-10-19 DIAGNOSIS — D696 Thrombocytopenia, unspecified: Secondary | ICD-10-CM | POA: Diagnosis not present

## 2018-10-19 DIAGNOSIS — I2510 Atherosclerotic heart disease of native coronary artery without angina pectoris: Secondary | ICD-10-CM | POA: Diagnosis not present

## 2018-10-19 DIAGNOSIS — I428 Other cardiomyopathies: Secondary | ICD-10-CM | POA: Diagnosis not present

## 2018-10-19 DIAGNOSIS — D630 Anemia in neoplastic disease: Secondary | ICD-10-CM | POA: Diagnosis not present

## 2018-10-19 DIAGNOSIS — N61 Mastitis without abscess: Secondary | ICD-10-CM | POA: Diagnosis not present

## 2018-10-23 ENCOUNTER — Other Ambulatory Visit: Payer: Self-pay

## 2018-10-23 ENCOUNTER — Ambulatory Visit (INDEPENDENT_AMBULATORY_CARE_PROVIDER_SITE_OTHER): Payer: Medicare Other | Admitting: General Practice

## 2018-10-23 DIAGNOSIS — Z7901 Long term (current) use of anticoagulants: Secondary | ICD-10-CM | POA: Diagnosis not present

## 2018-10-23 LAB — POCT INR: INR: 2.6 (ref 2.0–3.0)

## 2018-10-23 NOTE — Patient Instructions (Signed)
Pre visit review using our clinic review tool, if applicable. No additional management support is needed unless otherwise documented below in the visit note.  Continue 3 mg daily except 1.5 mg on Wed.  Re-check in 6 weeks.

## 2018-10-24 ENCOUNTER — Other Ambulatory Visit: Payer: Self-pay | Admitting: Hematology

## 2018-10-25 DIAGNOSIS — L03311 Cellulitis of abdominal wall: Secondary | ICD-10-CM | POA: Diagnosis not present

## 2018-10-25 DIAGNOSIS — I428 Other cardiomyopathies: Secondary | ICD-10-CM | POA: Diagnosis not present

## 2018-10-25 DIAGNOSIS — N61 Mastitis without abscess: Secondary | ICD-10-CM | POA: Diagnosis not present

## 2018-10-25 DIAGNOSIS — R7881 Bacteremia: Secondary | ICD-10-CM | POA: Diagnosis not present

## 2018-10-25 DIAGNOSIS — Z452 Encounter for adjustment and management of vascular access device: Secondary | ICD-10-CM | POA: Diagnosis not present

## 2018-10-25 DIAGNOSIS — K449 Diaphragmatic hernia without obstruction or gangrene: Secondary | ICD-10-CM | POA: Diagnosis not present

## 2018-10-25 DIAGNOSIS — I480 Paroxysmal atrial fibrillation: Secondary | ICD-10-CM | POA: Diagnosis not present

## 2018-10-25 DIAGNOSIS — I2510 Atherosclerotic heart disease of native coronary artery without angina pectoris: Secondary | ICD-10-CM | POA: Diagnosis not present

## 2018-10-25 DIAGNOSIS — D696 Thrombocytopenia, unspecified: Secondary | ICD-10-CM | POA: Diagnosis not present

## 2018-10-25 DIAGNOSIS — K5730 Diverticulosis of large intestine without perforation or abscess without bleeding: Secondary | ICD-10-CM | POA: Diagnosis not present

## 2018-10-25 DIAGNOSIS — I700 Atherosclerosis of aorta: Secondary | ICD-10-CM | POA: Diagnosis not present

## 2018-10-25 DIAGNOSIS — M150 Primary generalized (osteo)arthritis: Secondary | ICD-10-CM | POA: Diagnosis not present

## 2018-10-25 DIAGNOSIS — C50412 Malignant neoplasm of upper-outer quadrant of left female breast: Secondary | ICD-10-CM | POA: Diagnosis not present

## 2018-10-25 DIAGNOSIS — N183 Chronic kidney disease, stage 3 (moderate): Secondary | ICD-10-CM | POA: Diagnosis not present

## 2018-10-25 DIAGNOSIS — I723 Aneurysm of iliac artery: Secondary | ICD-10-CM | POA: Diagnosis not present

## 2018-10-25 DIAGNOSIS — M5136 Other intervertebral disc degeneration, lumbar region: Secondary | ICD-10-CM | POA: Diagnosis not present

## 2018-10-25 DIAGNOSIS — D630 Anemia in neoplastic disease: Secondary | ICD-10-CM | POA: Diagnosis not present

## 2018-10-25 DIAGNOSIS — J449 Chronic obstructive pulmonary disease, unspecified: Secondary | ICD-10-CM | POA: Diagnosis not present

## 2018-10-25 DIAGNOSIS — I5022 Chronic systolic (congestive) heart failure: Secondary | ICD-10-CM | POA: Diagnosis not present

## 2018-10-25 DIAGNOSIS — I69351 Hemiplegia and hemiparesis following cerebral infarction affecting right dominant side: Secondary | ICD-10-CM | POA: Diagnosis not present

## 2018-10-25 DIAGNOSIS — I130 Hypertensive heart and chronic kidney disease with heart failure and stage 1 through stage 4 chronic kidney disease, or unspecified chronic kidney disease: Secondary | ICD-10-CM | POA: Diagnosis not present

## 2018-10-25 DIAGNOSIS — D631 Anemia in chronic kidney disease: Secondary | ICD-10-CM | POA: Diagnosis not present

## 2018-10-25 DIAGNOSIS — K8020 Calculus of gallbladder without cholecystitis without obstruction: Secondary | ICD-10-CM | POA: Diagnosis not present

## 2018-10-25 DIAGNOSIS — K2970 Gastritis, unspecified, without bleeding: Secondary | ICD-10-CM | POA: Diagnosis not present

## 2018-10-25 DIAGNOSIS — B9689 Other specified bacterial agents as the cause of diseases classified elsewhere: Secondary | ICD-10-CM | POA: Diagnosis not present

## 2018-10-30 DIAGNOSIS — I5022 Chronic systolic (congestive) heart failure: Secondary | ICD-10-CM | POA: Diagnosis not present

## 2018-10-30 DIAGNOSIS — K449 Diaphragmatic hernia without obstruction or gangrene: Secondary | ICD-10-CM | POA: Diagnosis not present

## 2018-10-30 DIAGNOSIS — D630 Anemia in neoplastic disease: Secondary | ICD-10-CM | POA: Diagnosis not present

## 2018-10-30 DIAGNOSIS — N61 Mastitis without abscess: Secondary | ICD-10-CM | POA: Diagnosis not present

## 2018-10-30 DIAGNOSIS — I723 Aneurysm of iliac artery: Secondary | ICD-10-CM | POA: Diagnosis not present

## 2018-10-30 DIAGNOSIS — Z452 Encounter for adjustment and management of vascular access device: Secondary | ICD-10-CM | POA: Diagnosis not present

## 2018-10-30 DIAGNOSIS — C50412 Malignant neoplasm of upper-outer quadrant of left female breast: Secondary | ICD-10-CM | POA: Diagnosis not present

## 2018-10-30 DIAGNOSIS — I130 Hypertensive heart and chronic kidney disease with heart failure and stage 1 through stage 4 chronic kidney disease, or unspecified chronic kidney disease: Secondary | ICD-10-CM | POA: Diagnosis not present

## 2018-10-30 DIAGNOSIS — K8020 Calculus of gallbladder without cholecystitis without obstruction: Secondary | ICD-10-CM | POA: Diagnosis not present

## 2018-10-30 DIAGNOSIS — N183 Chronic kidney disease, stage 3 (moderate): Secondary | ICD-10-CM | POA: Diagnosis not present

## 2018-10-30 DIAGNOSIS — D631 Anemia in chronic kidney disease: Secondary | ICD-10-CM | POA: Diagnosis not present

## 2018-10-30 DIAGNOSIS — B9689 Other specified bacterial agents as the cause of diseases classified elsewhere: Secondary | ICD-10-CM | POA: Diagnosis not present

## 2018-10-30 DIAGNOSIS — I428 Other cardiomyopathies: Secondary | ICD-10-CM | POA: Diagnosis not present

## 2018-10-30 DIAGNOSIS — K2970 Gastritis, unspecified, without bleeding: Secondary | ICD-10-CM | POA: Diagnosis not present

## 2018-10-30 DIAGNOSIS — M150 Primary generalized (osteo)arthritis: Secondary | ICD-10-CM | POA: Diagnosis not present

## 2018-10-30 DIAGNOSIS — R7881 Bacteremia: Secondary | ICD-10-CM | POA: Diagnosis not present

## 2018-10-30 DIAGNOSIS — I700 Atherosclerosis of aorta: Secondary | ICD-10-CM | POA: Diagnosis not present

## 2018-10-30 DIAGNOSIS — M5136 Other intervertebral disc degeneration, lumbar region: Secondary | ICD-10-CM | POA: Diagnosis not present

## 2018-10-30 DIAGNOSIS — D696 Thrombocytopenia, unspecified: Secondary | ICD-10-CM | POA: Diagnosis not present

## 2018-10-30 DIAGNOSIS — J449 Chronic obstructive pulmonary disease, unspecified: Secondary | ICD-10-CM | POA: Diagnosis not present

## 2018-10-30 DIAGNOSIS — I480 Paroxysmal atrial fibrillation: Secondary | ICD-10-CM | POA: Diagnosis not present

## 2018-10-30 DIAGNOSIS — K5730 Diverticulosis of large intestine without perforation or abscess without bleeding: Secondary | ICD-10-CM | POA: Diagnosis not present

## 2018-10-30 DIAGNOSIS — I2510 Atherosclerotic heart disease of native coronary artery without angina pectoris: Secondary | ICD-10-CM | POA: Diagnosis not present

## 2018-10-30 DIAGNOSIS — L03311 Cellulitis of abdominal wall: Secondary | ICD-10-CM | POA: Diagnosis not present

## 2018-10-30 DIAGNOSIS — I69351 Hemiplegia and hemiparesis following cerebral infarction affecting right dominant side: Secondary | ICD-10-CM | POA: Diagnosis not present

## 2018-11-16 NOTE — Progress Notes (Signed)
[facility]   [phone] [phone]   Clinic Follow up Note   Patient Care Team: [name], [name], MD as PCP - General (Family Medicine) End, Harrell Gave, MD as PCP - Cardiology (Cardiology) [name], MD as Consulting Physician (Hematology) Kyung Rudd, MD as Consulting Physician (Radiation Oncology) Rolm Bookbinder, MD as Consulting Physician (General Surgery) Delice Bison, Charlestine Massed, NP as Nurse Practitioner (Hematology and Oncology)  Date of Service:  11/20/2018  CHIEF COMPLAINT: F/u of left breast cancer and left breast cellulitis   SUMMARY OF ONCOLOGIC HISTORY: Oncology History Overview Note  Cancer Staging Malignant neoplasm of upper-outer quadrant of left breast in female, estrogen receptor positive ([place]) Staging form: Breast, AJCC 8th Edition - Clinical stage from 12/13/2016: Stage IB (cT2, cN0, cM0, G2, ER: Positive, PR: Positive, HER2: Negative) - Signed by [name], MD on 12/21/2016 - Pathologic stage from 01/14/2017: Stage IA (pT2, pN0, cM0, G1, ER: Positive, PR: Positive, HER2: Negative, Oncotype DX score: 4) - Signed by [name], MD on 04/17/2017     Malignant neoplasm of upper-outer quadrant of left breast in female, estrogen receptor positive ([place])  12/06/2016 Mammogram   Diagnostic Mammogram 12/06/16 IMPRESSION:  Suspicious mass in the left breast at 3 o'clock 2 cm from the nipple measuring 1.9 x 1.1 x 2.2 cm. RECOMMENDATION: Ultrasound-guided core biopsy of the mass in the 3 o'clock region of the left breast is recommended. The biopsy will be scheduled at the patient's convenience.   12/13/2016 Initial Biopsy   Diagnosis 12/13/16 Breast, left, needle core biopsy, 3:00 o'clock, 2cmfn - INVASIVE DUCTAL CARCINOMA - SEE COMMENT   12/16/2016 Initial Diagnosis   Malignant neoplasm of upper-outer quadrant of left breast in female, estrogen receptor positive ([facility])   12/17/2016 Receptors her2   Estrogen Receptor: 100%,  POSITIVE, STRONG STAINING INTENSITY Progesterone Receptor: 100%, POSITIVE, STRONG STAINING INTENSITY Proliferation Marker Ki67: 30% HER2 - NEGATIVE    01/14/2017 Surgery   Left breast lumpectomy with Dr. Donne Hazel   01/14/2017 Pathology Results   Diagnosis 01/14/17 1. Breast, lumpectomy, Left - INVASIVE DUCTAL CARCINOMA, GRADE I/III, SPANNING 2.1 CM. - DUCTAL CARCINOMA IN SITU, LOW GRADE. - INVASIVE CARCINOMA IS BROADLY PRESENT AT THE INFERIOR MARGIN OF SPECIMEN 1. - DUCTAL CARCINOMA IN SITU IS FOCALLY PRESENT AT THE INFERIOR MARGIN OF SPECIMEN 1 AND BROADLY LESS THAN 0.1 CM TO THE LATERAL MARGIN OF SPECIMEN 1. - SEE ONCOLOGY TABLE BELOW. 2. Breast, excision, Additional medial margin left - DUCTAL CARCINOMA IN SITU, LOW GRADE. - DUCTAL CARCINOMA IS FOCALLY LESS THAN 0.1 CM TO THE NEW MARGIN OF SPECIMEN 2. 3. Breast, excision, Additional lateral margin left - DUCTAL CARCINOMA IN SITU, LOW GRADE. - DUCTAL CARCINOMA IN SITU IS GREATER THAN 0.2 CM TO ALL MARGINS. 4. Breast, excision, Additional superior margin left - DUCTAL CARCINOMA IN SITU, LOW GRADE. - DUCTAL CARCINOMA IN SITU IS BROADLY LESS THAN 0.1 CM TO THE NEW MARGIN OF SPECIMEN 4. 5. Lymph node, sentinel, biopsy, Left axillary - THERE IS NO EVIDENCE OF CARCINOMA IN 1 OF 1 LYMPH NODE (0/1). 6. Breast, excision, Additional inferior margin left - DUCTAL CARCINOMA IN SITU, LOW GRADE. - DUCTAL CARCINOMA IN SITU IS GREATER THAN 0.2 CM TO ALL MARGINS.    01/14/2017 Oncotype testing   Her oncotype recurrence score is 4 and her distance recurrent on Tamoxifen alone is 3%.   03/08/2017 - 04/05/2017 Radiation Therapy   RT with Dr. Lisbeth Renshaw    06/2017 -  Anti-estrogen oral therapy   Tamoxifen  daily   12/15/2017 Mammogram   12/15/2017 Mammogram IMPRESSION: New lumpectomy site left breast. No mammographic evidence of malignancy in either breast.      CURRENT THERAPY:  Adjuvant Tamoxifen 20 mg daily started on 04/20/17  INTERVAL  HISTORY:  Danielle [name] is here for a follow up of left breast cancer. She presents to the clinic alone.  She notes she has been doing well. Her breast cellulitis is gone. She notes she has been trying to lose weight. She has been exercising more and eating less. She lost 20 pounds. Her goal is 190 pounds. She notes her feet swell occasionally and she takes a diuretic every other day. She notes she has been tolerating Tamoxifen well with tolerable hot flashes. She notes having a hard time sleeping. This is not new but has worsened as she gets older. She has tried melatonin and benadryl. She notes her breathing is adequate but she cannot sleep flat.   REVIEW OF SYSTEMS:   Constitutional: Denies fevers, chills (+) Purposeful weight loss, 20 pounds (+) Tolerable hot flashes (+) trouble sleeping  Eyes: Denies blurriness of vision Ears, nose, mouth, throat, and face: Denies mucositis or sore throat Respiratory: Denies cough, dyspnea or wheezes Cardiovascular: Denies palpitation, chest discomfort or lower extremity swelling Gastrointestinal:  Denies nausea, heartburn or change in bowel habits Skin: Denies abnormal skin rashes Lymphatics: Denies new lymphadenopathy or easy bruising Neurological:Denies numbness, tingling or new weaknesses Behavioral/Psych: Mood is stable, no new changes  All other systems were reviewed with the patient and are negative.  MEDICAL HISTORY:  Past Medical History:  Diagnosis Date  . Allergy   . Anxiety   . Aortic stenosis    Status post bioprosthetic AVR  . Arthritis    DJD  . Asthma   . Breast cancer ([facility]) 12/13/2016   Left breast  . Chronic systolic CHF (congestive heart failure) ([facility])    EF 30% 04/2014  . COPD (chronic obstructive pulmonary disease) ([facility])   . Coronary artery disease    per pt, had LHC prior to AVR in [place] that did not show any blockages; no stents/bypass  . Gastritis   . Hyperlipidemia   . Hypertension   .  Hyperthyroidism   . Multiple thyroid nodules   . Osteoporosis   . Paroxysmal atrial fibrillation ([facility])   . Pelvis fracture ([facility]) 08/19/2015   MULTIPLE   . Personal history of radiation therapy 2018  . Spontaneous pneumothorax 11/28/2015   left   . Stroke ([facility]) 2000   rt hand weak    SURGICAL HISTORY: Past Surgical History:  Procedure Laterality Date  . BREAST LUMPECTOMY Left 12/2016  . BREAST LUMPECTOMY WITH RADIOACTIVE SEED AND SENTINEL LYMPH NODE BIOPSY Left 01/14/2017   Procedure: LEFT BREAST LUMPECTOMY WITH RADIOACTIVE SEED AND SENTINEL LYMPH NODE BIOPSY;  Surgeon: Rolm Bookbinder, MD;  Location: [facility];  Service: General;  Laterality: Left;  . CHEST TUBE INSERTION  10/2015  . INTRAMEDULLARY (IM) NAIL INTERTROCHANTERIC Left 12/10/2015   Procedure: INTRAMEDULLARY (IM) NAIL INTERTROCHANTRIC;  Surgeon: Leandrew Koyanagi, MD;  Location: [facility];  Service: Orthopedics;  Laterality: Left;  . PLEURADESIS Left 12/03/2015   Procedure: PLEURADESIS;  Surgeon: Melrose Nakayama, MD;  Location: [facility];  Service: Thoracic;  Laterality: Left;  . RESECTION OF APICAL BLEB Left 12/03/2015   Procedure: BLEBECTOMY;  Surgeon: Melrose Nakayama, MD;  Location: [facility];  Service: Thoracic;  Laterality: Left;  . THORACOSCOPY  12/03/2015  . VALVE REPLACEMENT    .  VIDEO ASSISTED THORACOSCOPY Left 12/03/2015   Procedure: VIDEO ASSISTED THORACOSCOPY;  Surgeon: Melrose Nakayama, MD;  Location: [facility];  Service: Thoracic;  Laterality: Left;    I have reviewed the social history and family history with the patient and they are unchanged from previous note.  ALLERGIES:  is allergic to lisinopril and tetanus toxoid adsorbed.  MEDICATIONS:  Current Outpatient Medications  Medication Sig Dispense Refill  . acetaminophen (TYLENOL) 500 MG tablet Take 1,000 mg by mouth daily as needed (PAIN).    Marland Kitchen albuterol (PROVENTIL HFA;VENTOLIN HFA) 108 (90 Base) MCG/ACT inhaler Inhale 2 puffs into the lungs every 6 (six)  hours as needed for wheezing or shortness of breath.     Marland Kitchen alendronate (FOSAMAX) 70 MG tablet Take 1 tablet (70 mg total) by mouth once a week. Take with a full glass of water on an empty stomach. 13 tablet 3  . aspirin EC 81 MG tablet Take 81 mg daily by mouth.    . budesonide-formoterol (SYMBICORT) 160-4.5 MCG/ACT inhaler Inhale 2 puffs into the lungs 2 (two) times daily. 1 Inhaler 0  . carvedilol (COREG) 25 MG tablet Take 1 tablet by mouth twice daily 180 tablet 1  . Cholecalciferol (VITAMIN D3) 400 units tablet Take 1 tablet (400 Units total) by mouth daily. 30 tablet 0  . diclofenac sodium (VOLTAREN) 1 % GEL Apply 4 g topically 4 (four) times daily. 500 g 3  . diphenhydramine-acetaminophen (TYLENOL PM) 25-500 MG TABS tablet Take 2 tablets by mouth at bedtime.     . furosemide (LASIX) 20 MG tablet Take 1 tablet (20 mg total) by mouth daily. 90 tablet 2  . losartan (COZAAR) 50 MG tablet Take 1 tablet by mouth once daily 90 tablet 1  . methimazole (TAPAZOLE) 5 MG tablet Take 3 tablets (15 mg total) by mouth every evening. 90 tablet 2  . rosuvastatin (CRESTOR) 40 MG tablet Take 1 tablet by mouth daily 90 tablet 0  . senna-docusate (SENOKOT-S) 8.6-50 MG tablet Take 2 tablets at bedtime by mouth.     . tamoxifen (NOLVADEX) 20 MG tablet Take 1 tablet by mouth once daily 90 tablet 0  . traMADol (ULTRAM) 50 MG tablet Take 1 tablet (50 mg total) by mouth 3 (three) times daily. 90 tablet 5  . valACYclovir (VALTREX) 500 MG tablet Take 1 tablet (500 mg total) by mouth 2 (two) times daily. For 3 days when outbreaks. (Patient taking differently: Take 500 mg by mouth 2 (two) times daily as needed (outbreaks). For 3 days when outbreaks.) 24 tablet 1  . warfarin (COUMADIN) 1 MG tablet 1 tab wednesdays (Patient taking differently: Take 1 mg by mouth once a week. Take 1 tablet (1 mg) Only on Wednesday) 20 tablet 2  . warfarin (COUMADIN) 3 MG tablet TAKE AS DIRECTED BY  ANTICOAGULATION  CLINIC (Patient taking  differently: Take 3 mg by mouth as directed. Take 1 tablet (3 mg) daily except on Wednesday. TAKE AS DIRECTED BY  ANTICOAGULATION  CLINIC) 90 tablet 1   No current facility-administered medications for this visit.     PHYSICAL EXAMINATION: ECOG PERFORMANCE STATUS: 2 - Symptomatic, <50% confined to bed  Vitals:   11/20/18 0934  BP: 136/87  Pulse: 72  Resp: 18  Temp: 98 F (36.7 C)  SpO2: 93%   Filed Weights   11/20/18 0934  Weight: 222 lb (100.7 kg)    GENERAL:alert, no distress and comfortable SKIN: skin color, texture, turgor are normal, no rashes or significant lesions  EYES: normal, Conjunctiva are pink and non-injected, sclera clear  NECK: supple, thyroid normal size, non-tender, without nodularity LYMPH:  no palpable lymphadenopathy in the cervical, axillary  LUNGS: clear to auscultation and percussion with normal breathing effort HEART: regular rate & rhythm and no murmurs and no lower extremity edema ABDOMEN:abdomen soft, non-tender and normal bowel sounds Musculoskeletal:no cyanosis of digits and no clubbing  NEURO: alert & oriented x 3 with fluent speech, no focal motor/sensory deficits BREAST: S/p left lumpectomy: surgical incision healed well. (+) 50m skin hyperpigmentation nodule of left breast s/p RT (+) left breast skin hyperpigmentation from RT. No palpable mass, nodules or adenopathy bilaterally. Breast exam benign.   LABORATORY DATA:  I have reviewed the data as listed CBC Latest Ref Rng & Units 11/20/2018 09/08/2018 09/01/2018  WBC 4.0 - 10.5 K/uL 4.5 4.7 5.0  Hemoglobin 12.0 - 15.0 g/dL 12.3 12.0 10.5(L)  Hematocrit 36.0 - 46.0 % 38.1 37.9 33.5(L)  Platelets 150 - 400 K/uL 84(L) 102(L) 81(L)     CMP Latest Ref Rng & Units 11/20/2018 09/08/2018 09/01/2018  Glucose 70 - 99 mg/dL 100(H) 92 94  BUN 8 - 23 mg/dL _0 Creatinine 0.44 - 1.00 mg/dL 1.20(H) 1.11(H) 1.13(H)  Sodium 135 - 145 mmol/L 142 140 138  Potassium 3.5 - 5.1 mmol/L 4.5 4.3 3.7  Chloride 98  - 111 mmol/L 111 110 110  CO2 22 - 32 mmol/L _1 Calcium 8.9 - 10.3 mg/dL 9.6 9.2 8.5(L)  Total Protein 6.5 - 8.1 g/dL 6.9 6.8 -  Total Bilirubin 0.3 - 1.2 mg/dL 0.3 0.3 -  Alkaline Phos 38 - 126 U/L 50 55 -  AST 15 - 41 U/L 13(L) 13(L) -  ALT 0 - 44 U/L 10 12 -      RADIOGRAPHIC STUDIES: I have personally reviewed the radiological images as listed and agreed with the findings in the report. No results found.   ASSESSMENT & PLAN:  GAmarilis Belfloweris a 73y.o. female with   1. Malignant neoplasm of upper-outer quadrant of left breast, Stage IB, c(T2,N0,M0 ), ER/PR: POSITIVE, HER2: NEGATIVE, Grade II -She was diagnosed in 11/2016. She is s/p left breastlumpectomyand adjuvant radiation.  -the Oncotype Dx result was previously reviewed with her in details. She has low risk based on the recurrence score, which predicts 10 year distant recurrence after 5 years of tamoxifen 3%. There is no benefit of adjuvant chemotherapy for low risk disease. -She started Tamoxifen in 06/2017. Tolerating well, will continue for 5 to 10 years.Due to her severe arthritis, she may not be able to tolerate aromatase inhibitor. -I discussed there is a mild drug interactionbetweenTamoxifen and Coumadin given Tamoxifen can increase her coumadin level. She has been on both for over a year now without issue.She follows her PCP for PT/INR check monthly.She is fine to continue and she will continue to follow up with her PCP.  -She is clinically doing well. Lab reviewed, her CBC and CMP are within normal limits except 84K, BG 100, Cr 1.20. Her physical exam and her 08/2018 Left mammogram were unremarkable except 890mskin lesion of right breast s/p RT. There is no clinical concern for recurrence. -Continue Surveillance. Next routine mammogram in 11/2018 -Continue Tamoxifen  -F/u in 6 months.     2. 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 one of her 4 initial blood culture was  positive formethicillin sensitive coagulase-negative staph, possible skin contamination -axillary [place] and CT abdomen results, she has a 4.2cm seroma in left breast -she was treated with iv antibiotics   -cellulitis resolved now. Her skin hyperpigmentation is still present on exam today now with left breast skin breast lesion (11/20/18).  -She ambulates with walker.   3. Osteoporosis, Arthritis  -Underwent Left Hip surgery with rod placement in 2017, ambulates with walker -On Fosamax -Next DEXA in 11/2018  -Arthritis isstable,she will continue to f/u with pain specialist. -She has purposefully lost 20 pounds with diet and exercise. Her goal weight is 190 pounds. I encouraged her to continue.   4. CAD, Afib, CHF, Aortic Stenosis, H/o Stroke -On Coreg, Coumadin -clinically stable -follow-up with her primary care physician  5.Insomnia -I recommend OTC benadryl or melatonin  -I also reviewed sleep hygiene with her.  6. Mild Thrombocytopenia, likely ITP -Per pt she did have Platelet transfusion with her prior heart surgery.  -Plt at 84K today (11/20/18) -At this mild level, there is no indication for treatment. Will monitor. she is fine to continue Coumadin.  -If she develops nose, gum or GI bleeding she should contact Dr. [name] or our clinic.     PLAN:  -flu shot today  -She is clinically doing well  -Continue Tamoxifen  -Labs reviewed with her.  -Mammogram and DEXA in 11/2018  -lab and f/u in 6 months    No problem-specific Assessment & Plan notes found for this encounter.   No orders of the defined types were placed in this encounter.  All questions were answered. The patient knows to call the clinic with any problems, questions or concerns. No barriers to learning was detected.      [name], MD 11/20/2018   I, Joslyn Devon, am acting as scribe for [name], MD.   I have reviewed the above documentation for accuracy and completeness, and I agree with the above.

## 2018-11-17 ENCOUNTER — Telehealth: Payer: Self-pay | Admitting: Hematology

## 2018-11-17 NOTE — Telephone Encounter (Signed)
Called patient regarding pre-screening questions before upcoming appointment, left a vm.

## 2018-11-20 ENCOUNTER — Other Ambulatory Visit: Payer: Self-pay

## 2018-11-20 ENCOUNTER — Inpatient Hospital Stay: Payer: Medicare Other | Attending: Hematology | Admitting: Hematology

## 2018-11-20 ENCOUNTER — Encounter: Payer: Self-pay | Admitting: Hematology

## 2018-11-20 ENCOUNTER — Inpatient Hospital Stay: Payer: Medicare Other

## 2018-11-20 VITALS — BP 136/87 | HR 72 | Temp 98.0°F | Resp 18 | Ht 64.0 in | Wt 222.0 lb

## 2018-11-20 DIAGNOSIS — J449 Chronic obstructive pulmonary disease, unspecified: Secondary | ICD-10-CM | POA: Diagnosis not present

## 2018-11-20 DIAGNOSIS — C50412 Malignant neoplasm of upper-outer quadrant of left female breast: Secondary | ICD-10-CM | POA: Diagnosis not present

## 2018-11-20 DIAGNOSIS — Z170 Estrogen receptor positive status [ER+]: Secondary | ICD-10-CM | POA: Diagnosis not present

## 2018-11-20 DIAGNOSIS — F419 Anxiety disorder, unspecified: Secondary | ICD-10-CM | POA: Diagnosis not present

## 2018-11-20 DIAGNOSIS — Z8719 Personal history of other diseases of the digestive system: Secondary | ICD-10-CM | POA: Insufficient documentation

## 2018-11-20 DIAGNOSIS — Z79810 Long term (current) use of selective estrogen receptor modulators (SERMs): Secondary | ICD-10-CM | POA: Insufficient documentation

## 2018-11-20 DIAGNOSIS — E785 Hyperlipidemia, unspecified: Secondary | ICD-10-CM | POA: Diagnosis not present

## 2018-11-20 DIAGNOSIS — Z8673 Personal history of transient ischemic attack (TIA), and cerebral infarction without residual deficits: Secondary | ICD-10-CM | POA: Insufficient documentation

## 2018-11-20 DIAGNOSIS — I5022 Chronic systolic (congestive) heart failure: Secondary | ICD-10-CM | POA: Insufficient documentation

## 2018-11-20 DIAGNOSIS — Z23 Encounter for immunization: Secondary | ICD-10-CM | POA: Diagnosis not present

## 2018-11-20 DIAGNOSIS — I480 Paroxysmal atrial fibrillation: Secondary | ICD-10-CM | POA: Diagnosis not present

## 2018-11-20 DIAGNOSIS — Z923 Personal history of irradiation: Secondary | ICD-10-CM | POA: Insufficient documentation

## 2018-11-20 DIAGNOSIS — I110 Hypertensive heart disease with heart failure: Secondary | ICD-10-CM | POA: Diagnosis not present

## 2018-11-20 DIAGNOSIS — I2510 Atherosclerotic heart disease of native coronary artery without angina pectoris: Secondary | ICD-10-CM | POA: Diagnosis not present

## 2018-11-20 DIAGNOSIS — N61 Mastitis without abscess: Secondary | ICD-10-CM | POA: Diagnosis not present

## 2018-11-20 DIAGNOSIS — R232 Flushing: Secondary | ICD-10-CM | POA: Insufficient documentation

## 2018-11-20 DIAGNOSIS — G4700 Insomnia, unspecified: Secondary | ICD-10-CM | POA: Diagnosis not present

## 2018-11-20 DIAGNOSIS — D696 Thrombocytopenia, unspecified: Secondary | ICD-10-CM | POA: Diagnosis not present

## 2018-11-20 DIAGNOSIS — M1990 Unspecified osteoarthritis, unspecified site: Secondary | ICD-10-CM | POA: Insufficient documentation

## 2018-11-20 DIAGNOSIS — Z7982 Long term (current) use of aspirin: Secondary | ICD-10-CM | POA: Diagnosis not present

## 2018-11-20 DIAGNOSIS — Z79899 Other long term (current) drug therapy: Secondary | ICD-10-CM | POA: Insufficient documentation

## 2018-11-20 LAB — CBC WITH DIFFERENTIAL (CANCER CENTER ONLY)
Abs Immature Granulocytes: 0 10*3/uL (ref 0.00–0.07)
Basophils Absolute: 0 10*3/uL (ref 0.0–0.1)
Basophils Relative: 1 %
Eosinophils Absolute: 0.2 10*3/uL (ref 0.0–0.5)
Eosinophils Relative: 5 %
HCT: 38.1 % (ref 36.0–46.0)
Hemoglobin: 12.3 g/dL (ref 12.0–15.0)
Immature Granulocytes: 0 %
Lymphocytes Relative: 23 %
Lymphs Abs: 1 10*3/uL (ref 0.7–4.0)
MCH: 30.6 pg (ref 26.0–34.0)
MCHC: 32.3 g/dL (ref 30.0–36.0)
MCV: 94.8 fL (ref 80.0–100.0)
Monocytes Absolute: 0.5 10*3/uL (ref 0.1–1.0)
Monocytes Relative: 10 %
Neutro Abs: 2.8 10*3/uL (ref 1.7–7.7)
Neutrophils Relative %: 61 %
Platelet Count: 84 10*3/uL — ABNORMAL LOW (ref 150–400)
RBC: 4.02 MIL/uL (ref 3.87–5.11)
RDW: 14 % (ref 11.5–15.5)
WBC Count: 4.5 10*3/uL (ref 4.0–10.5)
nRBC: 0 % (ref 0.0–0.2)

## 2018-11-20 LAB — CMP (CANCER CENTER ONLY)
ALT: 10 U/L (ref 0–44)
AST: 13 U/L — ABNORMAL LOW (ref 15–41)
Albumin: 3.7 g/dL (ref 3.5–5.0)
Alkaline Phosphatase: 50 U/L (ref 38–126)
Anion gap: 7 (ref 5–15)
BUN: 14 mg/dL (ref 8–23)
CO2: 24 mmol/L (ref 22–32)
Calcium: 9.6 mg/dL (ref 8.9–10.3)
Chloride: 111 mmol/L (ref 98–111)
Creatinine: 1.2 mg/dL — ABNORMAL HIGH (ref 0.44–1.00)
GFR, Est AFR Am: 52 mL/min — ABNORMAL LOW (ref >60)
GFR, Estimated: 45 mL/min — ABNORMAL LOW (ref >60)
Glucose, Bld: 100 mg/dL — ABNORMAL HIGH (ref 70–99)
Potassium: 4.5 mmol/L (ref 3.5–5.1)
Sodium: 142 mmol/L (ref 135–145)
Total Bilirubin: 0.3 mg/dL (ref 0.3–1.2)
Total Protein: 6.9 g/dL (ref 6.5–8.1)

## 2018-11-20 MED ORDER — INFLUENZA VAC A&B SA ADJ QUAD 0.5 ML IM PRSY
0.5000 mL | PREFILLED_SYRINGE | Freq: Once | INTRAMUSCULAR | Status: AC
  Administered 2018-11-20: 10:00:00 0.5 mL via INTRAMUSCULAR

## 2018-11-21 ENCOUNTER — Telehealth: Payer: Self-pay | Admitting: Hematology

## 2018-11-21 NOTE — Telephone Encounter (Signed)
Scheduled appt per 9/21 los.  Printed and mailed appt calendar.  Contacted [facility] and they will contact patient about scheduling the appt.

## 2018-11-28 ENCOUNTER — Other Ambulatory Visit: Payer: Self-pay

## 2018-11-28 ENCOUNTER — Telehealth: Payer: Self-pay

## 2018-11-28 NOTE — Telephone Encounter (Signed)
Patient left a voicemail asking why she had a follow up appointment with Dr. Burr Medico for October when and requesting to be called back.   Looked at Dr. Ernestina Penna last office visit which stated for patient to have lab/MD visit in 6 months. Appointments were already scheduled for March 2021, so I canceled appointments in October and returned patient's call to update her.  While talking with patient she mentioned she still had not heard from [facility] about scheduling her mammogram and bone density scan for next month. Told patient I would reach out them and call her back if I was able to make the appointments for her.   Spoke with scheduler at [facility] who informed me that the patient could either have the bone density scan done tomorrow at 10:00 or have to wait until December, and that her mammogram would have to wait till after 10/17 so it would be a year out from her previous one. Asked scheduler to schedule the bone density scan for tomorrow and then see what mammogram appointments were available after 10/17. She said she could schedule patient for 12/18/18 at 10:40am. Confirmed both appointments again, and called patient back with update.   Patient stated she would be able to make both appointments and knows to arrive 15 min early prior to both. Denied any other needs at this time and knows to call our office back if anything changes before her next follow up with Dr. Burr Medico.

## 2018-11-29 ENCOUNTER — Other Ambulatory Visit: Payer: Self-pay | Admitting: Family Medicine

## 2018-11-29 ENCOUNTER — Ambulatory Visit
Admission: RE | Admit: 2018-11-29 | Discharge: 2018-11-29 | Disposition: A | Discharge status: Home / Self Care | Payer: Medicare Other | Source: Ambulatory Visit | Attending: Hematology | Admitting: Hematology

## 2018-11-29 ENCOUNTER — Other Ambulatory Visit: Payer: Self-pay

## 2018-11-29 DIAGNOSIS — M85832 Other specified disorders of bone density and structure, left forearm: Secondary | ICD-10-CM | POA: Diagnosis not present

## 2018-11-29 DIAGNOSIS — E2839 Other primary ovarian failure: Secondary | ICD-10-CM

## 2018-11-29 DIAGNOSIS — Z780 Asymptomatic menopausal state: Secondary | ICD-10-CM | POA: Diagnosis not present

## 2018-11-29 DIAGNOSIS — M810 Age-related osteoporosis without current pathological fracture: Secondary | ICD-10-CM | POA: Diagnosis not present

## 2018-11-30 ENCOUNTER — Other Ambulatory Visit: Payer: Self-pay | Admitting: Family Medicine

## 2018-11-30 ENCOUNTER — Telehealth: Payer: Self-pay

## 2018-11-30 DIAGNOSIS — E0590 Thyrotoxicosis, unspecified without thyrotoxic crisis or storm: Secondary | ICD-10-CM

## 2018-11-30 NOTE — Telephone Encounter (Signed)
-----   Message from [name], MD sent at 11/30/2018  3:12 PM EDT ----- Please let pt know her DEXA showed osteoporosis, which she is known to have and on fosamax. I do not have her old DEXA to compare the change. Thanks   [name]  11/30/2018

## 2018-11-30 NOTE — Telephone Encounter (Signed)
Spoke with patient regarding bone density scan results.  Per Dr. Burr Medico notified her DEXA showed osteoporosis which she knows she has currently on fosamax.  Did not have old DEXA to compare the change.  The patient verbalized an understanding.

## 2018-12-01 ENCOUNTER — Encounter: Payer: Self-pay | Admitting: General Practice

## 2018-12-01 NOTE — Progress Notes (Signed)
[facility] CSW Progress Notes  Call from patient, wants to find a Education officer, museum in the community for her sister who is blind - is moving to [place] at some point in future.  Provided contact information for [employer] [facility] [employer].  Also advised that sister should be linked w primary care practice that has a Production assistant, radio.  Edwyna Shell, LCSW Clinical Social Worker Phone:  [phone]

## 2018-12-04 ENCOUNTER — Other Ambulatory Visit: Payer: Self-pay

## 2018-12-04 ENCOUNTER — Ambulatory Visit (INDEPENDENT_AMBULATORY_CARE_PROVIDER_SITE_OTHER): Payer: Medicare Other | Admitting: General Practice

## 2018-12-04 DIAGNOSIS — Z7901 Long term (current) use of anticoagulants: Secondary | ICD-10-CM

## 2018-12-04 LAB — POCT INR: INR: 1.8 — AB (ref 2.0–3.0)

## 2018-12-04 NOTE — Patient Instructions (Addendum)
Pre visit review using our clinic review tool, if applicable. No additional management support is needed unless otherwise documented below in the visit note.  Take 2 tablets today (10/5) and then continue 3 mg daily except 1.5 mg on Wed.  Re-check in 4 weeks.

## 2018-12-06 ENCOUNTER — Telehealth: Payer: Self-pay | Admitting: Family Medicine

## 2018-12-06 NOTE — Telephone Encounter (Signed)
Rx sent to the pharmacy on 12/05/2018 by Dr. [name].

## 2018-12-06 NOTE — Telephone Encounter (Signed)
rx refill methimazole (TAPAZOLE) 5 MG tablet  [place] ([place]), [place] - [address] [phone] (Phone) [phone] (Fax)

## 2018-12-18 ENCOUNTER — Other Ambulatory Visit: Payer: Self-pay

## 2018-12-18 ENCOUNTER — Ambulatory Visit
Admission: RE | Admit: 2018-12-18 | Discharge: 2018-12-18 | Disposition: A | Discharge status: Home / Self Care | Payer: Medicare Other | Source: Ambulatory Visit | Attending: Hematology | Admitting: Hematology

## 2018-12-18 ENCOUNTER — Other Ambulatory Visit: Payer: Medicare Other

## 2018-12-18 ENCOUNTER — Ambulatory Visit: Payer: Medicare Other | Admitting: Hematology

## 2018-12-18 DIAGNOSIS — Z170 Estrogen receptor positive status [ER+]: Secondary | ICD-10-CM

## 2018-12-18 DIAGNOSIS — R922 Inconclusive mammogram: Secondary | ICD-10-CM | POA: Diagnosis not present

## 2018-12-18 DIAGNOSIS — C50412 Malignant neoplasm of upper-outer quadrant of left female breast: Secondary | ICD-10-CM

## 2018-12-20 ENCOUNTER — Encounter: Payer: Medicare Other | Admitting: Physical Medicine & Rehabilitation

## 2018-12-26 ENCOUNTER — Encounter: Payer: Medicare Other | Attending: Physical Medicine & Rehabilitation | Admitting: Physical Medicine & Rehabilitation

## 2018-12-26 ENCOUNTER — Other Ambulatory Visit: Payer: Self-pay

## 2018-12-26 ENCOUNTER — Encounter: Payer: Self-pay | Admitting: Physical Medicine & Rehabilitation

## 2018-12-26 VITALS — BP 135/87 | HR 79 | Temp 97.3°F | Ht 67.0 in | Wt 258.0 lb

## 2018-12-26 DIAGNOSIS — G894 Chronic pain syndrome: Secondary | ICD-10-CM | POA: Insufficient documentation

## 2018-12-26 DIAGNOSIS — M159 Polyosteoarthritis, unspecified: Secondary | ICD-10-CM | POA: Insufficient documentation

## 2018-12-26 DIAGNOSIS — Z79891 Long term (current) use of opiate analgesic: Secondary | ICD-10-CM | POA: Diagnosis not present

## 2018-12-26 DIAGNOSIS — Z5181 Encounter for therapeutic drug level monitoring: Secondary | ICD-10-CM | POA: Insufficient documentation

## 2018-12-26 DIAGNOSIS — M25562 Pain in left knee: Secondary | ICD-10-CM | POA: Insufficient documentation

## 2018-12-26 DIAGNOSIS — M25561 Pain in right knee: Secondary | ICD-10-CM | POA: Insufficient documentation

## 2018-12-26 DIAGNOSIS — G8929 Other chronic pain: Secondary | ICD-10-CM | POA: Diagnosis not present

## 2018-12-26 MED ORDER — TRAMADOL HCL 50 MG PO TABS: 50.0000 mg | ORAL_TABLET | Freq: Three times a day (TID) | ORAL | 5 refills | Status: DC

## 2018-12-26 NOTE — Patient Instructions (Signed)
Please call in 1 month if not much better to schedule Right Knee Genicular nerve blocks

## 2018-12-26 NOTE — Progress Notes (Signed)
Subjective:    Patient ID: Danielle [name], female    DOB: 1945-06-10, 73 y.o.   MRN: [id number]  HPI 73 year old female with polyarthralgia due to osteoarthritis returns after 55-month hiatus.  The patient did not return as scheduled due to Covid virus pandemic.  In addition interval medical history positive for left breast mastitis requiring hospitalization and IV antibiotics No longer taking gabapentin did not notice increased pain after she stopped taking it Continues to take tramadol 50mg  TID  RIght > Left knee pain  Had dexa scan told she had osteoporosis Uses voltaren gel to right shoulder and knees  Flexogenics injections not helpful, the patient thinks these were cortisone rather than the gel injections.  PT post hospitalization for Left breast mastitis   Pain Inventory Average Pain 7 Pain Right Now 7 My pain is burning and aching  In the last 24 hours, has pain interfered with the following? General activity 0 Relation with others 0 Enjoyment of life 0 What TIME of day is your pain at its worst? ? Sleep (in general) NA  Pain is worse with: walking and standing Pain improves with: medication Relief from Meds: 5  Mobility walk with assistance use a walker  Function disabled: date disabled .  Neuro/Psych trouble walking  Prior Studies Any changes since last visit?  no  Physicians involved in your care Any changes since last visit?  no   Family History  Problem Relation Age of Onset  . Diabetes Mother   . Heart attack Mother 59  . Diabetes Father   . Lung cancer Father   . Diabetes Sister   . Thyroid disease Sister   . Diabetes Sister   . HIV Brother    Social History   Socioeconomic History  . Marital status: Married    Spouse name: Sherwood  . Number of children: 0  . Years of education: Not on file  . Highest education level: Not on file  Occupational History  . Occupation: Retired in 2004  Social Needs  . Financial  resource strain: Not on file  . Food insecurity    Worry: Not on file    Inability: Not on file  . Transportation needs    Medical: Not on file    Non-medical: Not on file  Tobacco Use  . Smoking status: Former Smoker    Packs/day: 0.25    Years: 10.00    Pack years: 2.50    Types: Cigarettes    Quit date: 03/01/2010    Years since quitting: 8.8  . Smokeless tobacco: Never Used  Substance and Sexual Activity  . Alcohol use: No  . Drug use: No  . Sexual activity: Not Currently  Lifestyle  . Physical activity    Days per week: Not on file    Minutes per session: Not on file  . Stress: Not on file  Relationships  . Social Herbalist on phone: Not on file    Gets together: Not on file    Attends religious service: Not on file    Active member of club or organization: Not on file    Attends meetings of clubs or organizations: Not on file    Relationship status: Not on file  Other Topics Concern  . Not on file  Social History Narrative   Lives with husband.  Ambulated independently.   Past Surgical History:  Procedure Laterality Date  . BREAST LUMPECTOMY Left 12/2016  . BREAST LUMPECTOMY WITH RADIOACTIVE SEED AND  SENTINEL LYMPH NODE BIOPSY Left 01/14/2017   Procedure: LEFT BREAST LUMPECTOMY WITH RADIOACTIVE SEED AND SENTINEL LYMPH NODE BIOPSY;  Surgeon: Rolm Bookbinder, MD;  Location: [facility];  Service: General;  Laterality: Left;  . CHEST TUBE INSERTION  10/2015  . INTRAMEDULLARY (IM) NAIL INTERTROCHANTERIC Left 12/10/2015   Procedure: INTRAMEDULLARY (IM) NAIL INTERTROCHANTRIC;  Surgeon: Leandrew Koyanagi, MD;  Location: [facility];  Service: Orthopedics;  Laterality: Left;  . PLEURADESIS Left 12/03/2015   Procedure: PLEURADESIS;  Surgeon: Melrose Nakayama, MD;  Location: [facility];  Service: Thoracic;  Laterality: Left;  . RESECTION OF APICAL BLEB Left 12/03/2015   Procedure: BLEBECTOMY;  Surgeon: Melrose Nakayama, MD;  Location: [facility];  Service: Thoracic;  Laterality:  Left;  . THORACOSCOPY  12/03/2015  . VALVE REPLACEMENT    . VIDEO ASSISTED THORACOSCOPY Left 12/03/2015   Procedure: VIDEO ASSISTED THORACOSCOPY;  Surgeon: Melrose Nakayama, MD;  Location: [facility];  Service: Thoracic;  Laterality: Left;   Past Medical History:  Diagnosis Date  . Allergy   . Anxiety   . Aortic stenosis    Status post bioprosthetic AVR  . Arthritis    DJD  . Asthma   . Breast cancer ([place]) 12/13/2016   Left breast  . Chronic systolic CHF (congestive heart failure) ([place])    EF 30% 04/2014  . COPD (chronic obstructive pulmonary disease) ([place])   . Coronary artery disease    per pt, had LHC prior to AVR in [place] that did not show any blockages; no stents/bypass  . Gastritis   . Hyperlipidemia   . Hypertension   . Hyperthyroidism   . Multiple thyroid nodules   . Osteoporosis   . Paroxysmal atrial fibrillation (HCC)   . Pelvis fracture ([place]) 08/19/2015   MULTIPLE   . Personal history of radiation therapy 2018  . Spontaneous pneumothorax 11/28/2015   left   . Stroke ([place]) 2000   rt hand weak   BP 135/87   Pulse 79   Temp (!) 97.3 F (36.3 C)   Ht 5\' 7"  (1.702 m)   Wt 258 lb (117 kg)   SpO2 96%   BMI 40.41 kg/m   Opioid Risk Score:   Fall Risk Score:  `1  Depression screen PHQ 2/9  Depression screen [facility] 2/9 09/18/2018 09/18/2018 01/04/2018 10/01/2017 09/28/2017 06/21/2017 05/03/2017  Decreased Interest 0 0 0 0 0 0 0  Down, Depressed, Hopeless 0 0 0 0 0 0 0  PHQ - 2 Score 0 0 0 0 0 0 0  Altered sleeping - - - - - - -  Tired, decreased energy - - - - - - -  Change in appetite - - - - - - -  Feeling bad or failure about yourself  - - - - - - -  Trouble concentrating - - - - - - -  Moving slowly or fidgety/restless - - - - - - -  Suicidal thoughts - - - - - - -  PHQ-9 Score - - - - - - -  Difficult doing work/chores - - - - - - -  Some recent data might be hidden    Review of Systems  Constitutional: Negative.   HENT: Negative.   Eyes: Negative.    Respiratory: Negative.   Cardiovascular: Negative.   Gastrointestinal: Negative.   Endocrine: Negative.   Genitourinary: Negative.   Musculoskeletal: Positive for arthralgias, back pain and gait problem.  Skin: Negative.   Allergic/Immunologic: Negative.  Hematological: Negative.   Psychiatric/Behavioral: Negative.   All other systems reviewed and are negative.      Objective:   Physical Exam Vitals signs and nursing note reviewed.  Constitutional:      Appearance: Normal appearance.  Eyes:     Extraocular Movements: Extraocular movements intact.     Pupils: Pupils are equal, round, and reactive to light.  Musculoskeletal:     Lumbar back: She exhibits decreased range of motion and tenderness.  Skin:    General: Skin is warm and dry.  Neurological:     General: No focal deficit present.     Mental Status: She is alert and oriented to person, place, and time.  Psychiatric:        Mood and Affect: Mood normal.        Behavior: Behavior normal.     Right knee medial and lateral joint line pain Left knee medial jt line tenderness No evidence of knee effusion no erythema no pain with range of motion. The patient has good range of motion bilateral knees with full extension and flexion to approximately 100 degrees. Sit to stand is modified independent.  She is using a rolling walker to ambulate.     Assessment & Plan:  1.  Chronic bilateral knee pain, pt   OPPT for bilateral knee pain and musculoskeletal gait d/o, exacerbated by deconditioning post hospitalization  If this is not helpful in alleviating her right knee pain which is the more painful extremity, would recommend genicular nerve blocks given her prior failure of corticosteroid injections.  #2.  Chronic low back pain with history of lumbar degenerative disc and spondylosis  Tramadol 50mg  TID reviewed PDMP, appropriate Last UDS approximately 1 year ago which was appropriate.  We will need to repeat next visit

## 2018-12-27 ENCOUNTER — Telehealth: Payer: Self-pay | Admitting: Physical Therapy

## 2018-12-27 NOTE — Telephone Encounter (Signed)
Attempted to contact regarding physical therapy referral received to inquire on preference for in-person vs. Telehealth visits given high risk for complications due to Covid. Left voicemail to contact clinic.

## 2018-12-28 ENCOUNTER — Ambulatory Visit: Payer: Medicare Other | Admitting: Physical Therapy

## 2018-12-29 LAB — DRUG TOX MONITOR 1 W/CONF, ORAL FLD

## 2018-12-29 LAB — DRUG TOX ALC METAB W/CON, ORAL FLD: Alcohol Metabolite: NEGATIVE ng/mL (ref <25)

## 2019-01-01 ENCOUNTER — Other Ambulatory Visit: Payer: Self-pay

## 2019-01-01 ENCOUNTER — Telehealth: Payer: Self-pay | Admitting: *Deleted

## 2019-01-01 ENCOUNTER — Ambulatory Visit (INDEPENDENT_AMBULATORY_CARE_PROVIDER_SITE_OTHER): Payer: Medicare Other | Admitting: General Practice

## 2019-01-01 DIAGNOSIS — Z7901 Long term (current) use of anticoagulants: Secondary | ICD-10-CM | POA: Diagnosis not present

## 2019-01-01 LAB — POCT INR: INR: 2.1 (ref 2.0–3.0)

## 2019-01-01 NOTE — Telephone Encounter (Signed)
Oral swab drug screen was consistent for prescribed medications.  ?

## 2019-01-01 NOTE — Patient Instructions (Signed)
Pre visit review using our clinic review tool, if applicable. No additional management support is needed unless otherwise documented below in the visit note.  Continue 3 mg daily except 1.5 mg on Wed.  Re-check in 4 weeks.   

## 2019-01-03 ENCOUNTER — Encounter: Payer: Self-pay | Admitting: Physical Therapy

## 2019-01-03 ENCOUNTER — Ambulatory Visit: Payer: Medicare Other | Attending: Physical Medicine & Rehabilitation | Admitting: Physical Therapy

## 2019-01-03 ENCOUNTER — Other Ambulatory Visit: Payer: Self-pay

## 2019-01-03 DIAGNOSIS — G8929 Other chronic pain: Secondary | ICD-10-CM | POA: Diagnosis not present

## 2019-01-03 DIAGNOSIS — M25562 Pain in left knee: Secondary | ICD-10-CM | POA: Insufficient documentation

## 2019-01-03 DIAGNOSIS — M25561 Pain in right knee: Secondary | ICD-10-CM | POA: Diagnosis not present

## 2019-01-03 DIAGNOSIS — R262 Difficulty in walking, not elsewhere classified: Secondary | ICD-10-CM

## 2019-01-03 DIAGNOSIS — R293 Abnormal posture: Secondary | ICD-10-CM | POA: Insufficient documentation

## 2019-01-03 DIAGNOSIS — M6281 Muscle weakness (generalized): Secondary | ICD-10-CM | POA: Insufficient documentation

## 2019-01-03 NOTE — Therapy (Signed)
[place], [place], [address] Phone: [phone]   Fax:  [phone]  Physical Therapy Evaluation  Patient Details  Name: Danielle [name] MRN: [id number] Date of Birth: 1945-11-10 Referring Provider (PT): [name], MD   Encounter Date: 01/03/2019  PT End of Session - 01/03/19 1311    Visit Number  1    Number of Visits  12    Date for PT Re-Evaluation  02/14/19    Authorization Type  [employer] Medicare    PT Start Time  1015    PT Stop Time  1059    PT Time Calculation (min)  44 min    Activity Tolerance  Patient limited by pain    Behavior During Therapy  [facility] for tasks assessed/performed       Past Medical History:  Diagnosis Date  . Allergy   . Anxiety   . Aortic stenosis    Status post bioprosthetic AVR  . Arthritis    DJD  . Asthma   . Breast cancer ([place]) 12/13/2016   Left breast  . Chronic systolic CHF (congestive heart failure) ([place])    EF 30% 04/2014  . COPD (chronic obstructive pulmonary disease) ([place])   . Coronary artery disease    per pt, had LHC prior to AVR in [place] that did not show any blockages; no stents/bypass  . Gastritis   . Hyperlipidemia   . Hypertension   . Hyperthyroidism   . Multiple thyroid nodules   . Osteoporosis   . Paroxysmal atrial fibrillation (HCC)   . Pelvis fracture ([place]) 08/19/2015   MULTIPLE   . Personal history of radiation therapy 2018  . Spontaneous pneumothorax 11/28/2015   left   . Stroke [facility]) 2000   rt hand weak    Past Surgical History:  Procedure Laterality Date  . BREAST LUMPECTOMY Left 12/2016  . BREAST LUMPECTOMY WITH RADIOACTIVE SEED AND SENTINEL LYMPH NODE BIOPSY Left 01/14/2017   Procedure: LEFT BREAST LUMPECTOMY WITH RADIOACTIVE SEED AND SENTINEL LYMPH NODE BIOPSY;  Surgeon: Rolm Bookbinder, MD;  Location: [facility];  Service: General;  Laterality: Left;  . CHEST TUBE INSERTION  10/2015  . INTRAMEDULLARY (IM)  NAIL INTERTROCHANTERIC Left 12/10/2015   Procedure: INTRAMEDULLARY (IM) NAIL INTERTROCHANTRIC;  Surgeon: Leandrew Koyanagi, MD;  Location: [facility];  Service: Orthopedics;  Laterality: Left;  . PLEURADESIS Left 12/03/2015   Procedure: PLEURADESIS;  Surgeon: Melrose Nakayama, MD;  Location: [facility];  Service: Thoracic;  Laterality: Left;  . RESECTION OF APICAL BLEB Left 12/03/2015   Procedure: BLEBECTOMY;  Surgeon: Melrose Nakayama, MD;  Location: [facility];  Service: Thoracic;  Laterality: Left;  . THORACOSCOPY  12/03/2015  . VALVE REPLACEMENT    . VIDEO ASSISTED THORACOSCOPY Left 12/03/2015   Procedure: VIDEO ASSISTED THORACOSCOPY;  Surgeon: Melrose Nakayama, MD;  Location: [facility];  Service: Thoracic;  Laterality: Left;    There were no vitals filed for this visit.   Subjective Assessment - 01/03/19 1258    Subjective  Pt. is a 73 y/o female referred to PT with c/o chronic bilateral knee pain as well as deconditioning s/p hospitalization this past Summer (hospitalized for 5 days in early July for cellulitis with history of left breast CA s/p lumpectomy). Pt. ambulates with walker and reports use of this began with left hip fracture s/p IM nail in 2017, she had been progressing last year with therapy with cane but setback in status with hospitalization.  Pertinent History  chronic pain in pain management-chronic LBP with DDD, spondylosis, breast CA s/p lumpectomy and radiation, COPD, CHF, osteoporosis, a-fib, history CVA    Limitations  Standing;Walking;House hold activities    Diagnostic tests  X-rays    Patient Stated Goals  "Walk by myself"    Currently in Pain?  Yes    Pain Score  7     Pain Location  Knee    Pain Orientation  Left;Right    Pain Descriptors / Indicators  Burning    Pain Type  Chronic pain    Pain Onset  More than a month ago    Pain Frequency  Intermittent    Aggravating Factors   standing and walking    Pain Relieving Factors  rest, medication    Effect of Pain on  Daily Activities  limits standing and walking tolerance         [facility] PT Assessment - 01/03/19 0001      Assessment   Medical Diagnosis  Chronic pain of both knees    Referring Provider (PT)  [name], MD    Onset Date/Surgical Date  08/30/18   estimated date of change in status with hospitalization   Hand Dominance  Right    Prior Therapy  --   reports did 4 home health PT visits a  few weeks ago     Precautions   Precautions  Fall      Restrictions   Weight Bearing Restrictions  No      Balance Screen   Has the patient fallen in the past 6 months  No      [place] residence    Living Arrangements  Spouse/significant other    Type of [place] Access  --   1 step to enter, no rail   Home Layout  One level    Smoaks - 2 wheels;Cane - single point;Shower seat    Additional Comments  Pt. reports able to dress and bathe independently but spouse assists with cooking and cleaning      Prior Function   Level of Independence  Independent with community mobility with device   see comments under gait     Cognition   Overall Cognitive Status  Within Functional Limits for tasks assessed      Observation/Other Assessments   Focus on Therapeutic Outcomes (FOTO)   63% Limited      Sensation   Light Touch  Appears Intact      ROM / Strength   AROM / PROM / Strength  AROM;PROM;Strength      AROM   AROM Assessment Site  Knee    Right/Left Knee  Right;Left    Right Knee Extension  0    Right Knee Flexion  90    Left Knee Extension  0    Left Knee Flexion  90      PROM   Overall PROM Comments  Limited tolerance knee and hip PROM bilat. with high pain level    PROM Assessment Site  Knee    Right/Left Knee  Right;Left    Right Knee Flexion  103    Left Knee Flexion  95   empty endfeel due to pain     Strength   Strength Assessment Site  Hip;Knee;Ankle    Right/Left Hip  Right;Left    Right Hip  Flexion  4/5    Right Hip  External Rotation   4/5    Right Hip Internal Rotation  4/5    Left Hip Flexion  4+/5    Left Hip External Rotation  4-/5    Left Hip Internal Rotation  4/5    Right/Left Knee  Right;Left    Right Knee Flexion  5/5    Right Knee Extension  4+/5    Left Knee Flexion  5/5    Left Knee Extension  5/5    Right/Left Ankle  Right;Left    Right Ankle Dorsiflexion  5/5    Right Ankle Inversion  5/5    Right Ankle Eversion  5/5    Left Ankle Dorsiflexion  4+/5    Left Ankle Inversion  4/5    Left Ankle Eversion  4-/5      Flexibility   Soft Tissue Assessment /Muscle Length  --   SLR 60 deg with hamstring tightness bilat.     Transfers   Transfers  Sit to Stand;Stand to Sit;Supine to Sit;Sit to Supine    Five time sit to stand comments   36 seconds     Comments  mod I for sit<>stand with RW, mod I for bed mobility due to increased time      Ambulation/Gait   Gait Comments  TUG: 34 seconds, pt. ambulates in clinic mod I with RW with antalgic gait with decreased cadence, she reports RW use following left hip fx. with IM nail 2017 and had been progressing with gait with cane last year but setback in status with hospitalization last Summer                 Objective measurements completed on examination: See above findings.      [facility] Adult PT Treatment/Exercise - 01/03/19 0001      Exercises   Exercises  --   HEP handout review-see chart copy            PT Education - 01/03/19 1311    Education Details  HEP, POC, exam findings    Person(s) Educated  Patient    Methods  Explanation;Demonstration;Verbal cues;Handout    Comprehension  Returned demonstration;Verbalized understanding       PT Short Term Goals - 01/03/19 1322      PT SHORT TERM GOAL #1   Title  Indeoendent with HEP    Baseline  needs new HEP    Time  3    Period  Weeks    Status  New    Target Date  01/24/19      PT SHORT TERM GOAL #2   Title  Increase bilat. knee  flexion AROM at least 5-10 deg to improve ability for sit<>stand from lower chairs and for step/stair navigation    Baseline  90 deg bilat.    Time  3    Period  Weeks    Status  New    Target Date  01/31/19        PT Long Term Goals - 01/03/19 1324      PT LONG TERM GOAL #1   Title  Increase right knee extension strength to 5/5 to improve ability for sit>stand from low chairs and step/stair navigation    Baseline  4+/5    Time  6    Period  Weeks    Status  New    Target Date  02/14/19      PT LONG TERM GOAL #2   Title  Pt. to improve TUG and 5x sit<>stand  times at least 10 seconds ea. to work towards decreased fall risk and improved functional strength for transfers    Baseline  TUG 34 sec, 5x sit<>stand 36 sec    Time  6    Period  Weeks    Status  New    Target Date  02/14/19      PT LONG TERM GOAL #3   Title  Improve FOTO outcome measure score to 47% or less impairment    Baseline  63% limited    Time  6    Period  Weeks    Status  New    Target Date  02/14/19      PT LONG TERM GOAL #4   Title  Ambulate 100 feet or greater CGA-SBA with SPC for progression to ability for gait with LRAD    Baseline  mod I with RW    Time  6    Period  Weeks    Status  New    Target Date  02/14/19             Plan - 01/03/19 1313    Clinical Impression Statement  Pt. presents with bilateral knee pain with decreased ROM/stiffness and muscle weakness with underlying OA with deconditioning s/p hospitalization last Summer. TUG score suggestive of increased fall risk. Pt. would benefit from PT to help address current deficits to improve functional status and safety with mobility.    Personal Factors and Comorbidities  Age;Comorbidity 3+;Time since onset of injury/illness/exacerbation    Comorbidities  chronic pain history, LBP, recent breast CA, COPD, CHF, weakness after left hip fracture s/p IM nail, obesity    Examination-Activity Limitations   Stairs;Stand;Transfers;Squat;Lift;Locomotion Level    Examination-Participation Restrictions  Community Activity;Meal Prep;Shop;Yard Work;Laundry;Cleaning    Stability/Clinical Decision Making  Evolving/Moderate complexity    Clinical Decision Making  Moderate    Rehab Potential  Fair    PT Frequency  2x / week    PT Duration  6 weeks    PT Treatment/Interventions  ADLs/Self Care Home Management;Cryotherapy;Moist Heat;Stair training;Balance training;Functional mobility training;Therapeutic activities;Therapeutic exercise;Neuromuscular re-education;Patient/family education;Manual techniques;Passive range of motion;Vasopneumatic Device;Taping;Gait training    PT Next Visit Plan  see PMH (plan hold modalities except ice or heat due to recent CA history), Trial NUSTEP warm up, review HEP as needed, focus LE strengthening with open chain exercises progress/add closed chain as tolerated pending pain, work on gait/mobility as able    PT Home Exercise Plan  sit<>stand, heel raises, LAQ, quad sets, glut sets    Consulted and Agree with Plan of Care  Patient       Patient will benefit from skilled therapeutic intervention in order to improve the following deficits and impairments:  Pain, Difficulty walking, Decreased balance, Abnormal gait, Decreased range of motion, Decreased strength, Decreased activity tolerance, Impaired flexibility  Visit Diagnosis: Chronic pain of right knee  Chronic pain of left knee  Muscle weakness (generalized)  Difficulty in walking, not elsewhere classified     Problem List Patient Active Problem List   Diagnosis Date Noted  . Bacteremia due to Gram-positive bacteria 08/29/2018  . Abscess   . Cellulitis of left breast 08/28/2018  . Cellulitis of left abdominal wall 08/28/2018  . Encounter for routine gynecological examination 04/28/2018  . CKD (chronic kidney disease), stage III 09/27/2017  . Recurrent genital herpes 04/13/2017  . Chronic pain disorder  01/25/2017  . Malignant neoplasm of upper-outer quadrant of left breast in female, estrogen receptor positive ([place]) 12/16/2016  .  Chronic knee pain 12/07/2016  . Chronic bilateral low back pain without sciatica 12/07/2016  . Peripheral musculoskeletal gait disorder 12/07/2016  . Encounter for therapeutic drug monitoring 06/16/2016  . Generalized osteoarthritis of multiple sites 05/04/2016  . Chronic anticoagulation 05/04/2016  . Stroke ([place]) 04/14/2016  . Aortic valve disease 04/10/2016  . History of aortic valve replacement 04/10/2016  . NICM (nonischemic cardiomyopathy) ([place]) 04/10/2016  . Upper airway cough syndrome 03/14/2016  . Morbid obesity due to excess calories ([place]) 03/14/2016  . COPD GOLD 0  03/11/2016  . Thrombocytopenia ([place]) 12/12/2015  . Anemia 12/12/2015  . Vitamin D deficiency 12/12/2015  . Acute urinary retention 12/11/2015  . Osteoporosis 12/11/2015  . Hip fracture ([place]) 12/10/2015  . Aortic atherosclerosis ([place]) 12/10/2015  . Lung blebs ([place]) 12/03/2015  . Pelvic fracture ([place]) 08/19/2015  . Fall at home 08/19/2015  . Essential hypertension 08/19/2015  . Hyperlipidemia 08/19/2015  . Asthma 08/19/2015  . Paroxysmal atrial fibrillation ([place]) 08/19/2015  . Hyperthyroidism 08/19/2015  . Chronic systolic (congestive) heart failure ([place])   . 3-vessel CAD   . Aortic stenosis     Beaulah Dinning, PT, DPT 01/03/19 1:39 PM  [facility] [address] [place], [place], [address] Phone: [phone]   Fax:  [phone]  Name: Danielle [name] MRN: [id number] Date of Birth: 1945-11-17

## 2019-01-10 ENCOUNTER — Other Ambulatory Visit: Payer: Self-pay

## 2019-01-10 ENCOUNTER — Encounter: Payer: Self-pay | Admitting: Physical Therapy

## 2019-01-10 ENCOUNTER — Ambulatory Visit: Payer: Medicare Other | Admitting: Physical Therapy

## 2019-01-10 DIAGNOSIS — R262 Difficulty in walking, not elsewhere classified: Secondary | ICD-10-CM | POA: Diagnosis not present

## 2019-01-10 DIAGNOSIS — G8929 Other chronic pain: Secondary | ICD-10-CM

## 2019-01-10 DIAGNOSIS — R293 Abnormal posture: Secondary | ICD-10-CM | POA: Diagnosis not present

## 2019-01-10 DIAGNOSIS — M6281 Muscle weakness (generalized): Secondary | ICD-10-CM

## 2019-01-10 DIAGNOSIS — M25562 Pain in left knee: Secondary | ICD-10-CM | POA: Diagnosis not present

## 2019-01-10 DIAGNOSIS — M25561 Pain in right knee: Secondary | ICD-10-CM | POA: Diagnosis not present

## 2019-01-10 NOTE — Therapy (Signed)
[place], [place], [address] Phone: [phone]   Fax:  [phone]  Physical Therapy Treatment  Patient Details  Name: Danielle [name] MRN: [id number] Date of Birth: 26-May-1945 Referring Provider (PT): [name], MD   Encounter Date: 01/10/2019  PT End of Session - 01/10/19 1139    Visit Number  2    Number of Visits  12    Date for PT Re-Evaluation  02/14/19    Authorization Type  UHC Medicare    PT Start Time  1131    PT Stop Time  1215    PT Time Calculation (min)  44 min    Activity Tolerance  Patient tolerated treatment well    Behavior During Therapy  [facility] for tasks assessed/performed       Past Medical History:  Diagnosis Date  . Allergy   . Anxiety   . Aortic stenosis    Status post bioprosthetic AVR  . Arthritis    DJD  . Asthma   . Breast cancer ([place]) 12/13/2016   Left breast  . Chronic systolic CHF (congestive heart failure) ([place])    EF 30% 04/2014  . COPD (chronic obstructive pulmonary disease) ([place])   . Coronary artery disease    per pt, had LHC prior to AVR in [place] that did not show any blockages; no stents/bypass  . Gastritis   . Hyperlipidemia   . Hypertension   . Hyperthyroidism   . Multiple thyroid nodules   . Osteoporosis   . Paroxysmal atrial fibrillation (HCC)   . Pelvis fracture ([place]) 08/19/2015   MULTIPLE   . Personal history of radiation therapy 2018  . Spontaneous pneumothorax 11/28/2015   left   . Stroke [facility]) 2000   rt hand weak    Past Surgical History:  Procedure Laterality Date  . BREAST LUMPECTOMY Left 12/2016  . BREAST LUMPECTOMY WITH RADIOACTIVE SEED AND SENTINEL LYMPH NODE BIOPSY Left 01/14/2017   Procedure: LEFT BREAST LUMPECTOMY WITH RADIOACTIVE SEED AND SENTINEL LYMPH NODE BIOPSY;  Surgeon: Rolm Bookbinder, MD;  Location: [place];  Service: General;  Laterality: Left;  . CHEST TUBE INSERTION  10/2015  .  INTRAMEDULLARY (IM) NAIL INTERTROCHANTERIC Left 12/10/2015   Procedure: INTRAMEDULLARY (IM) NAIL INTERTROCHANTRIC;  Surgeon: Leandrew Koyanagi, MD;  Location: [place];  Service: Orthopedics;  Laterality: Left;  . PLEURADESIS Left 12/03/2015   Procedure: PLEURADESIS;  Surgeon: Melrose Nakayama, MD;  Location: [place];  Service: Thoracic;  Laterality: Left;  . RESECTION OF APICAL BLEB Left 12/03/2015   Procedure: BLEBECTOMY;  Surgeon: Melrose Nakayama, MD;  Location: [place];  Service: Thoracic;  Laterality: Left;  . THORACOSCOPY  12/03/2015  . VALVE REPLACEMENT    . VIDEO ASSISTED THORACOSCOPY Left 12/03/2015   Procedure: VIDEO ASSISTED THORACOSCOPY;  Surgeon: Melrose Nakayama, MD;  Location: [place];  Service: Thoracic;  Laterality: Left;    There were no vitals filed for this visit.  Subjective Assessment - 01/10/19 1132    Subjective  Patient reports increased knee pain that she attributes to the weather. She states her exercises are going well. Right knee is worst than the left.    Currently in Pain?  Yes    Pain Score  6     Pain Location  Knee    Pain Orientation  Right;Left   R>L   Pain Descriptors / Indicators  Aching;Burning    Pain Type  Chronic pain    Pain Onset  More than a month ago    Pain Frequency  Intermittent                       [facility] Adult PT Treatment/Exercise - 01/10/19 0001      Exercises   Exercises  Knee/Hip      Knee/Hip Exercises: Aerobic   Nustep  L3 x10 min with UE/LE      Knee/Hip Exercises: Seated   Sit to Sand  5 reps   4 sets, elevated table, use of wedge, hand support on thighs     Knee/Hip Exercises: Supine   Short Arc Quad Sets  Both;2 sets;10 reps    Short Arc Quad Sets Limitations  2 lbs    Bridges  10 reps    Bridges Limitations  Patient unable to clear hips from table    Other Supine Knee/Hip Exercises  Clamshell with yellow band 3x10    Other Supine Knee/Hip Exercises  Alternating marching 3x10             PT  Education - 01/10/19 1138    Education Details  HEP, walking in house throughout day    Person(s) Educated  Patient    Methods  Explanation;Demonstration;Verbal cues;Handout    Comprehension  Verbalized understanding;Verbal cues required;Need further instruction       PT Short Term Goals - 01/03/19 1322      PT SHORT TERM GOAL #1   Title  Indeoendent with HEP    Baseline  needs new HEP    Time  3    Period  Weeks    Status  New    Target Date  01/24/19      PT SHORT TERM GOAL #2   Title  Increase bilat. knee flexion AROM at least 5-10 deg to improve ability for sit<>stand from lower chairs and for step/stair navigation    Baseline  90 deg bilat.    Time  3    Period  Weeks    Status  New    Target Date  01/31/19        PT Long Term Goals - 01/03/19 1324      PT LONG TERM GOAL #1   Title  Increase right knee extension strength to 5/5 to improve ability for sit>stand from low chairs and step/stair navigation    Baseline  4+/5    Time  6    Period  Weeks    Status  New    Target Date  02/14/19      PT LONG TERM GOAL #2   Title  Pt. to improve TUG and 5x sit<>stand times at least 10 seconds ea. to work towards decreased fall risk and improved functional strength for transfers    Baseline  TUG 34 sec, 5x sit<>stand 36 sec    Time  6    Period  Weeks    Status  New    Target Date  02/14/19      PT LONG TERM GOAL #3   Title  Improve FOTO outcome measure score to 47% or less impairment    Baseline  63% limited    Time  6    Period  Weeks    Status  New    Target Date  02/14/19      PT LONG TERM GOAL #4   Title  Ambulate 100 feet or greater CGA-SBA with SPC for progression to ability for gait with LRAD    Baseline  mod I with RW    Time  6    Period  Weeks    Status  New    Target Date  02/14/19            Plan - 01/10/19 1140    Clinical Impression Statement  Patient tolerated addition of new strengthening exercises well. She did report minimal increase  knee pain with her exercises espcially with the right, but she did state that the NuStep helped her feel better and looser. She was able to perform sit<>stand with only mi use of hands on thighs when the table was elevated and use of wedge. She would benefit from continued skilled PT to progress her strength and allowed for improved walking ability.    PT Treatment/Interventions  ADLs/Self Care Home Management;Cryotherapy;Moist Heat;Stair training;Balance training;Functional mobility training;Therapeutic activities;Therapeutic exercise;Neuromuscular re-education;Patient/family education;Manual techniques;Passive range of motion;Vasopneumatic Device;Taping;Gait training    PT Next Visit Plan  see PMH (plan hold modalities except ice or heat due to recent CA history), NUSTEP warm up, review HEP as needed, focus LE strengthening with open chain exercises progress/add closed chain as tolerated pending pain, work on gait/mobility as able    PT Home Exercise Plan  sit<>stand - hands on thighs, heel raises, LAQ, quad sets, glut sets    Consulted and Agree with Plan of Care  Patient       Patient will benefit from skilled therapeutic intervention in order to improve the following deficits and impairments:  Pain, Difficulty walking, Decreased balance, Abnormal gait, Decreased range of motion, Decreased strength, Decreased activity tolerance, Impaired flexibility  Visit Diagnosis: Chronic pain of right knee  Chronic pain of left knee  Muscle weakness (generalized)  Difficulty in walking, not elsewhere classified  Abnormal posture     Problem List Patient Active Problem List   Diagnosis Date Noted  . Bacteremia due to Gram-positive bacteria 08/29/2018  . Abscess   . Cellulitis of left breast 08/28/2018  . Cellulitis of left abdominal wall 08/28/2018  . Encounter for routine gynecological examination 04/28/2018  . CKD (chronic kidney disease), stage III 09/27/2017  . Recurrent genital herpes  04/13/2017  . Chronic pain disorder 01/25/2017  . Malignant neoplasm of upper-outer quadrant of left breast in female, estrogen receptor positive ([place]) 12/16/2016  . Chronic knee pain 12/07/2016  . Chronic bilateral low back pain without sciatica 12/07/2016  . Peripheral musculoskeletal gait disorder 12/07/2016  . Encounter for therapeutic drug monitoring 06/16/2016  . Generalized osteoarthritis of multiple sites 05/04/2016  . Chronic anticoagulation 05/04/2016  . Stroke ([place]) 04/14/2016  . Aortic valve disease 04/10/2016  . History of aortic valve replacement 04/10/2016  . NICM (nonischemic cardiomyopathy) ([place]) 04/10/2016  . Upper airway cough syndrome 03/14/2016  . Morbid obesity due to excess calories ([place]) 03/14/2016  . COPD GOLD 0  03/11/2016  . Thrombocytopenia ([place]) 12/12/2015  . Anemia 12/12/2015  . Vitamin D deficiency 12/12/2015  . Acute urinary retention 12/11/2015  . Osteoporosis 12/11/2015  . Hip fracture ([place]) 12/10/2015  . Aortic atherosclerosis ([place]) 12/10/2015  . Lung blebs ([place]) 12/03/2015  . Pelvic fracture ([place]) 08/19/2015  . Fall at home 08/19/2015  . Essential hypertension 08/19/2015  . Hyperlipidemia 08/19/2015  . Asthma 08/19/2015  . Paroxysmal atrial fibrillation ([place]) 08/19/2015  . Hyperthyroidism 08/19/2015  . Chronic systolic (congestive) heart failure ([place])   . 3-vessel CAD   . Aortic stenosis     Hilda Blades, PT, DPT, LAT, ATC 01/10/19  12:25 PM Phone: [phone] Fax: [phone]  [place], [place], [address] Phone: [phone]   Fax:  [phone]  Name: Marceline Mcguigan MRN: [id number] Date of Birth: [birth year]

## 2019-01-15 ENCOUNTER — Other Ambulatory Visit: Payer: Self-pay

## 2019-01-15 ENCOUNTER — Encounter: Payer: Self-pay | Admitting: Physical Therapy

## 2019-01-15 ENCOUNTER — Other Ambulatory Visit: Payer: Self-pay | Admitting: Family Medicine

## 2019-01-15 ENCOUNTER — Ambulatory Visit: Payer: Medicare Other | Admitting: Physical Therapy

## 2019-01-15 DIAGNOSIS — M6281 Muscle weakness (generalized): Secondary | ICD-10-CM

## 2019-01-15 DIAGNOSIS — M25561 Pain in right knee: Secondary | ICD-10-CM | POA: Diagnosis not present

## 2019-01-15 DIAGNOSIS — R262 Difficulty in walking, not elsewhere classified: Secondary | ICD-10-CM | POA: Diagnosis not present

## 2019-01-15 DIAGNOSIS — M25562 Pain in left knee: Secondary | ICD-10-CM | POA: Diagnosis not present

## 2019-01-15 DIAGNOSIS — G8929 Other chronic pain: Secondary | ICD-10-CM | POA: Diagnosis not present

## 2019-01-15 DIAGNOSIS — E0590 Thyrotoxicosis, unspecified without thyrotoxic crisis or storm: Secondary | ICD-10-CM

## 2019-01-15 DIAGNOSIS — R293 Abnormal posture: Secondary | ICD-10-CM | POA: Diagnosis not present

## 2019-01-15 NOTE — Therapy (Signed)
[place], [place], [address] Phone: [phone]   Fax:  [phone]  Physical Therapy Treatment  Patient Details  Name: Danielle [name] MRN: [id number] Date of Birth: [birth year] Referring Provider (PT): [name], MD   Encounter Date: 01/15/2019  PT End of Session - 01/15/19 1115    Visit Number  3    Number of Visits  12    Date for PT Re-Evaluation  02/14/19    Authorization Type  UHC Medicare    PT Start Time  1017    PT Stop Time  1057    PT Time Calculation (min)  40 min    Activity Tolerance  Patient limited by fatigue    Behavior During Therapy  [facility] for tasks assessed/performed       Past Medical History:  Diagnosis Date  . Allergy   . Anxiety   . Aortic stenosis    Status post bioprosthetic AVR  . Arthritis    DJD  . Asthma   . Breast cancer ([place]) 12/13/2016   Left breast  . Chronic systolic CHF (congestive heart failure) ([place])    EF 30% 04/2014  . COPD (chronic obstructive pulmonary disease) ([place])   . Coronary artery disease    per pt, had LHC prior to AVR in [place] that did not show any blockages; no stents/bypass  . Gastritis   . Hyperlipidemia   . Hypertension   . Hyperthyroidism   . Multiple thyroid nodules   . Osteoporosis   . Paroxysmal atrial fibrillation (H[facility])   . Pelvis fracture ([place]) 08/19/2015   MULTIPLE   . Personal history of radiation therapy 2018  . Spontaneous pneumothorax 11/28/2015   left   . Stroke [facility]) 2000   rt hand weak    Past Surgical History:  Procedure Laterality Date  . BREAST LUMPECTOMY Left 12/2016  . BREAST LUMPECTOMY WITH RADIOACTIVE SEED AND SENTINEL LYMPH NODE BIOPSY Left 01/14/2017   Procedure: LEFT BREAST LUMPECTOMY WITH RADIOACTIVE SEED AND SENTINEL LYMPH NODE BIOPSY;  Surgeon: Rolm Bookbinder, MD;  Location: [place];  Service: General;  Laterality: Left;  . CHEST TUBE INSERTION  10/2015  . INTRAMEDULLARY  (IM) NAIL INTERTROCHANTERIC Left 12/10/2015   Procedure: INTRAMEDULLARY (IM) NAIL INTERTROCHANTRIC;  Surgeon: Leandrew Koyanagi, MD;  Location: [place];  Service: Orthopedics;  Laterality: Left;  . PLEURADESIS Left 12/03/2015   Procedure: PLEURADESIS;  Surgeon: Melrose Nakayama, MD;  Location: [place];  Service: Thoracic;  Laterality: Left;  . RESECTION OF APICAL BLEB Left 12/03/2015   Procedure: BLEBECTOMY;  Surgeon: Melrose Nakayama, MD;  Location: [place];  Service: Thoracic;  Laterality: Left;  . THORACOSCOPY  12/03/2015  . VALVE REPLACEMENT    . VIDEO ASSISTED THORACOSCOPY Left 12/03/2015   Procedure: VIDEO ASSISTED THORACOSCOPY;  Surgeon: Melrose Nakayama, MD;  Location: [place];  Service: Thoracic;  Laterality: Left;    There were no vitals filed for this visit.  Subjective Assessment - 01/15/19 1021    Subjective  Mild soreness after last visit but otherwise no new complaints/concerns this AM. Pt. reports doing HEP, continues with previous c/o knee pain.    Pertinent History  chronic pain in pain management-chronic LBP with DDD, spondylosis, breast CA s/p lumpectomy and radiation, COPD, CHF, osteoporosis, a-fib, history CVA    Limitations  Standing;Walking;House hold activities    Diagnostic tests  X-rays    Patient Stated Goals  "Walk by myself"    Currently in Pain?  Yes    Pain Score  6     Pain Location  Knee    Pain Orientation  Right;Left    Pain Descriptors / Indicators  Aching;Burning    Pain Type  Chronic pain    Pain Onset  More than a month ago    Pain Frequency  Intermittent    Aggravating Factors   standing and walking    Pain Relieving Factors  rest, medication    Effect of Pain on Daily Activities  limits standing and walking tolerance         [facility] PT Assessment - 01/15/19 0001      Strength   Right Knee Flexion  5/5    Right Knee Extension  4+/5    Left Knee Flexion  5/5    Left Knee Extension  5/5                   [facility] Adult PT  Treatment/Exercise - 01/15/19 0001      Ambulation/Gait   Ambulation/Gait  Yes    Ambulation/Gait Assistance  4: Min assist    Ambulation/Gait Assistance Details  --   see comments below   Ambulation Distance (Feet)  25 Feet    Assistive device  Small based quad cane    Gait Pattern  Step-to pattern;Decreased stride length;Wide base of support;Poor foot clearance - left;Poor foot clearance - right    Ambulation Surface  Level    Gait Comments  Cues for step-to gait pattern sequence holding cane in RUE      Knee/Hip Exercises: Aerobic   Nustep  L3 x10 min with UE/LE      Knee/Hip Exercises: Standing   Heel Raises  Both;2 sets;10 reps    Hip Abduction  AROM;Stengthening;Both;1 set;10 reps    Forward Step Up  10 reps;Hand Hold: 2;Step Height: 4"    Forward Step Up Limitations  5 ea. bilat. with bilat. UE support on counter    Functional Squat Limitations  partial squat at counter x 15 reps, chair behind patient for cueing for form and safety      Knee/Hip Exercises: Seated   Long Arc Quad  AROM;Strengthening;Both;2 sets;10 reps    Long Arc Quad Weight  3 lbs.    Clamshell with TheraBand  Green   x 15 reps   Hamstring Curl  AROM;Strengthening;Both;15 reps             PT Education - 01/15/19 1114    Education Details  HEP updates, gait training    Person(s) Educated  Patient    Methods  Explanation;Demonstration;Verbal cues;Handout    Comprehension  Returned demonstration;Need further instruction;Verbalized understanding;Verbal cues required       PT Short Term Goals - 01/03/19 1322      PT SHORT TERM GOAL #1   Title  Indeoendent with HEP    Baseline  needs new HEP    Time  3    Period  Weeks    Status  New    Target Date  01/24/19      PT SHORT TERM GOAL #2   Title  Increase bilat. knee flexion AROM at least 5-10 deg to improve ability for sit<>stand from lower chairs and for step/stair navigation    Baseline  90 deg bilat.    Time  3    Period  Weeks     Status  New    Target Date  01/31/19        PT Long  Term Goals - 01/15/19 1123      PT LONG TERM GOAL #1   Title  Increase right knee extension strength to 5/5 to improve ability for sit>stand from low chairs and step/stair navigation    Baseline  4+/5    Time  6    Period  Weeks    Status  On-going      PT LONG TERM GOAL #2   Title  Pt. to improve TUG and 5x sit<>stand times at least 10 seconds ea. to work towards decreased fall risk and improved functional strength for transfers    Baseline  TUG 34 sec, 5x sit<>stand 36 sec    Time  6    Period  Weeks    Status  On-going      PT LONG TERM GOAL #3   Title  Improve FOTO outcome measure score to 47% or less impairment    Baseline  63% limited    Time  6    Period  Weeks    Status  On-going      PT LONG TERM GOAL #4   Title  Ambulate 100 feet or greater CGA-SBA with SPC for progression to ability for gait with LRAD    Baseline  25 feet min assist with NBQC today    Time  6    Period  Weeks    Status  On-going            Plan - 01/15/19 1115    Clinical Impression Statement  Added standing/closed chain exercises today with partial squats at counter and step ups. Fair tolerance squats-more limited ability step ups due to weakness but able to complete as noted per flowsheet. Trial gait training today with quad cane. Unsteady and limited distance tolerance so continue to recommend RW use for mobility outside clinic. Pt. would benefit from continued PT for further progress to address functional limitations for mobility.    Personal Factors and Comorbidities  Age;Comorbidity 3+;Time since onset of injury/illness/exacerbation    Comorbidities  chronic pain history, LBP, recent breast CA, COPD, CHF, weakness after left hip fracture s/p IM nail, obesity    Examination-Activity Limitations  Stairs;Stand;Transfers;Squat;Lift;Locomotion Level    Examination-Participation Restrictions  Community Activity;Meal Prep;Shop;Yard  Work;Laundry;Cleaning    Stability/Clinical Decision Making  Evolving/Moderate complexity    Clinical Decision Making  Moderate    Rehab Potential  Fair    PT Frequency  2x / week    PT Duration  6 weeks    PT Treatment/Interventions  ADLs/Self Care Home Management;Cryotherapy;Moist Heat;Stair training;Balance training;Functional mobility training;Therapeutic activities;Therapeutic exercise;Neuromuscular re-education;Patient/family education;Manual techniques;Passive range of motion;Vasopneumatic Device;Taping;Gait training    PT Next Visit Plan  Continue NUSTEP, LE strengthening add/continue standing CKC exercises as tolerated, seated + mat based strengthening, further gait training with NBQC    PT Home Exercise Plan  sit<>stand - hands on thighs, heel raises, LAQ, quad sets, glut sets, LAQ with Theraband, seated clamshell with Theraband    Consulted and Agree with Plan of Care  Patient       Patient will benefit from skilled therapeutic intervention in order to improve the following deficits and impairments:  Pain, Difficulty walking, Decreased balance, Abnormal gait, Decreased range of motion, Decreased strength, Decreased activity tolerance, Impaired flexibility  Visit Diagnosis: Chronic pain of right knee  Chronic pain of left knee  Muscle weakness (generalized)  Difficulty in walking, not elsewhere classified     Problem List Patient Active Problem List   Diagnosis Date Noted  .  Bacteremia due to Gram-positive bacteria 08/29/2018  . Abscess   . Cellulitis of left breast 08/28/2018  . Cellulitis of left abdominal wall 08/28/2018  . Encounter for routine gynecological examination 04/28/2018  . CKD (chronic kidney disease), stage III 09/27/2017  . Recurrent genital herpes 04/13/2017  . Chronic pain disorder 01/25/2017  . Malignant neoplasm of upper-outer quadrant of left breast in female, estrogen receptor positive ([place]) 12/16/2016  . Chronic knee pain 12/07/2016  . Chronic  bilateral low back pain without sciatica 12/07/2016  . Peripheral musculoskeletal gait disorder 12/07/2016  . Encounter for therapeutic drug monitoring 06/16/2016  . Generalized osteoarthritis of multiple sites 05/04/2016  . Chronic anticoagulation 05/04/2016  . Stroke ([place]) 04/14/2016  . Aortic valve disease 04/10/2016  . History of aortic valve replacement 04/10/2016  . NICM (nonischemic cardiomyopathy) ([place]) 04/10/2016  . Upper airway cough syndrome 03/14/2016  . Morbid obesity due to excess calories ([place]) 03/14/2016  . COPD GOLD 0  03/11/2016  . Thrombocytopenia ([place]) 12/12/2015  . Anemia 12/12/2015  . Vitamin D deficiency 12/12/2015  . Acute urinary retention 12/11/2015  . Osteoporosis 12/11/2015  . Hip fracture ([place]) 12/10/2015  . Aortic atherosclerosis ([place]) 12/10/2015  . Lung blebs ([place]) 12/03/2015  . Pelvic fracture ([place]) 08/19/2015  . Fall at home 08/19/2015  . Essential hypertension 08/19/2015  . Hyperlipidemia 08/19/2015  . Asthma 08/19/2015  . Paroxysmal atrial fibrillation ([place]) 08/19/2015  . Hyperthyroidism 08/19/2015  . Chronic systolic (congestive) heart failure ([place])   . 3-vessel CAD   . Aortic stenosis     Beaulah Dinning, PT, DPT 01/15/19 11:24 AM  [place] [facility] [address] [place], [place], [address] Phone: [phone]   Fax:  [phone]  Name: Franci Melnyk MRN: [id number] Date of Birth: 1945/09/24

## 2019-01-16 ENCOUNTER — Other Ambulatory Visit: Payer: Self-pay | Admitting: Family Medicine

## 2019-01-16 DIAGNOSIS — E0590 Thyrotoxicosis, unspecified without thyrotoxic crisis or storm: Secondary | ICD-10-CM

## 2019-01-17 ENCOUNTER — Other Ambulatory Visit: Payer: Self-pay

## 2019-01-17 ENCOUNTER — Encounter: Payer: Self-pay | Admitting: Physical Therapy

## 2019-01-17 ENCOUNTER — Ambulatory Visit: Payer: Medicare Other | Admitting: Physical Therapy

## 2019-01-17 DIAGNOSIS — R262 Difficulty in walking, not elsewhere classified: Secondary | ICD-10-CM | POA: Diagnosis not present

## 2019-01-17 DIAGNOSIS — R293 Abnormal posture: Secondary | ICD-10-CM | POA: Diagnosis not present

## 2019-01-17 DIAGNOSIS — G8929 Other chronic pain: Secondary | ICD-10-CM

## 2019-01-17 DIAGNOSIS — M25561 Pain in right knee: Secondary | ICD-10-CM | POA: Diagnosis not present

## 2019-01-17 DIAGNOSIS — M6281 Muscle weakness (generalized): Secondary | ICD-10-CM

## 2019-01-17 DIAGNOSIS — M25562 Pain in left knee: Secondary | ICD-10-CM | POA: Diagnosis not present

## 2019-01-17 NOTE — Therapy (Signed)
[place], [place], [address] Phone: [phone]   Fax:  [phone]  Physical Therapy Treatment  Patient Details  Name: Danielle [name] MRN: [id number] Date of Birth: [birth year] Referring Provider (PT): [name], MD   Encounter Date: 01/17/2019  PT End of Session - 01/17/19 1128    Visit Number  4    Number of Visits  12    Date for PT Re-Evaluation  02/14/19    Authorization Type  UHC Medicare    PT Start Time  1100    PT Stop Time  1139    PT Time Calculation (min)  39 min    Activity Tolerance  Patient limited by fatigue    Behavior During Therapy  [facility] for tasks assessed/performed       Past Medical History:  Diagnosis Date  . Allergy   . Anxiety   . Aortic stenosis    Status post bioprosthetic AVR  . Arthritis    DJD  . Asthma   . Breast cancer ([place]) 12/13/2016   Left breast  . Chronic systolic CHF (congestive heart failure) ([place])    EF 30% 04/2014  . COPD (chronic obstructive pulmonary disease) ([place])   . Coronary artery disease    per pt, had LHC prior to AVR in [place] that did not show any blockages; no stents/bypass  . Gastritis   . Hyperlipidemia   . Hypertension   . Hyperthyroidism   . Multiple thyroid nodules   . Osteoporosis   . Paroxysmal atrial fibrillation (HCC)   . Pelvis fracture ([place]) 08/19/2015   MULTIPLE   . Personal history of radiation therapy 2018  . Spontaneous pneumothorax 11/28/2015   left   . Stroke [facility]) 2000   rt hand weak    Past Surgical History:  Procedure Laterality Date  . BREAST LUMPECTOMY Left 12/2016  . BREAST LUMPECTOMY WITH RADIOACTIVE SEED AND SENTINEL LYMPH NODE BIOPSY Left 01/14/2017   Procedure: LEFT BREAST LUMPECTOMY WITH RADIOACTIVE SEED AND SENTINEL LYMPH NODE BIOPSY;  Surgeon: Rolm Bookbinder, MD;  Location: [place];  Service: General;  Laterality: Left;  . CHEST TUBE INSERTION  10/2015  . INTRAMEDULLARY  (IM) NAIL INTERTROCHANTERIC Left 12/10/2015   Procedure: INTRAMEDULLARY (IM) NAIL INTERTROCHANTRIC;  Surgeon: Leandrew Koyanagi, MD;  Location: [place];  Service: Orthopedics;  Laterality: Left;  . PLEURADESIS Left 12/03/2015   Procedure: PLEURADESIS;  Surgeon: Melrose Nakayama, MD;  Location: [place];  Service: Thoracic;  Laterality: Left;  . RESECTION OF APICAL BLEB Left 12/03/2015   Procedure: BLEBECTOMY;  Surgeon: Melrose Nakayama, MD;  Location: [place];  Service: Thoracic;  Laterality: Left;  . THORACOSCOPY  12/03/2015  . VALVE REPLACEMENT    . VIDEO ASSISTED THORACOSCOPY Left 12/03/2015   Procedure: VIDEO ASSISTED THORACOSCOPY;  Surgeon: Melrose Nakayama, MD;  Location: [place];  Service: Thoracic;  Laterality: Left;    There were no vitals filed for this visit.  Subjective Assessment - 01/17/19 1103    Subjective  No significant new soreness after last tx. session. Knee pain issues have been ongoing but otherwise no new complaints or concerns this AM.    Pertinent History  chronic pain in pain management-chronic LBP with DDD, spondylosis, breast CA s/p lumpectomy and radiation, COPD, CHF, osteoporosis, a-fib, history CVA                       [facility] Adult PT Treatment/Exercise - 01/17/19 0001  Knee/Hip Exercises: Stretches   Passive Hamstring Stretch  Right;Left;2 reps;30 seconds      Knee/Hip Exercises: Aerobic   Nustep  L4 x 7 min      Knee/Hip Exercises: Standing   Heel Raises  Both;2 sets;10 reps    Hip Abduction  AROM;Stengthening;Both;1 set;10 reps    Forward Step Up  Both;2 sets;5 reps;Hand Hold: 2;Step Height: 4"    Forward Step Up Limitations  5 ea. bilat. with bilat. UE support on counter    Functional Squat Limitations  partial squat at counter x 15 reps, chair behind patient for cueing for form and safety   cues to reach hips back and avoid knee flexion past toes     Knee/Hip Exercises: Seated   Long Arc Quad  AROM;Strengthening;Both;2 sets;10  reps    Long Arc Quad Weight  4 lbs.    Marching  AROM;Both;2 sets;10 reps    Hamstring Curl  AROM;Strengthening;Both;2 sets;10 reps    Hamstring Limitations  green Theraband    Sit to [employer]  2 sets;5 reps      Knee/Hip Exercises: Supine   Short Arc Quad Sets  Both;2 sets;10 reps    Short Arc Quad Sets Limitations  2 lbs.    Hip Adduction Isometric  Strengthening;15 reps    Hip Adduction Isometric Limitations  ball squeeze in hooklying 3-5 sec holds    Bridges  2 sets;10 reps    Bridges Limitations  glut set to initiation lift off (unable to clear mat)    Other Supine Knee/Hip Exercises  clamshell green x 15 reps    Other Supine Knee/Hip Exercises  alternating march with cues PPT x 20 reps             PT Education - 01/17/19 1140    Education Details  exercises, bed mobility-cues foot position and glut activation to scoot with mat mobility and positioning for supine exercises    Person(s) Educated  Patient    Methods  Explanation;Demonstration;Verbal cues    Comprehension  Verbalized understanding;Returned demonstration;Need further instruction;Verbal cues required       PT Short Term Goals - 01/03/19 1322      PT SHORT TERM GOAL #1   Title  Indeoendent with HEP    Baseline  needs new HEP    Time  3    Period  Weeks    Status  New    Target Date  01/24/19      PT SHORT TERM GOAL #2   Title  Increase bilat. knee flexion AROM at least 5-10 deg to improve ability for sit<>stand from lower chairs and for step/stair navigation    Baseline  90 deg bilat.    Time  3    Period  Weeks    Status  New    Target Date  01/31/19        PT Long Term Goals - 01/15/19 1123      PT LONG TERM GOAL #1   Title  Increase right knee extension strength to 5/5 to improve ability for sit>stand from low chairs and step/stair navigation    Baseline  4+/5    Time  6    Period  Weeks    Status  On-going      PT LONG TERM GOAL #2   Title  Pt. to improve TUG and 5x sit<>stand times at  least 10 seconds ea. to work towards decreased fall risk and improved functional strength for transfers    Baseline  TUG 34 sec, 5x sit<>stand 36 sec    Time  6    Period  Weeks    Status  On-going      PT LONG TERM GOAL #3   Title  Improve FOTO outcome measure score to 47% or less impairment    Baseline  63% limited    Time  6    Period  Weeks    Status  On-going      PT LONG TERM GOAL #4   Title  Ambulate 100 feet or greater CGA-SBA with SPC for progression to ability for gait with LRAD    Baseline  25 feet min assist with NBQC today    Time  6    Period  Weeks    Status  On-going            Plan - 01/17/19 1130    Clinical Impression Statement  Tx. focus as previously on closed and open chain LE strengthening and improving functional activity tolerance. Still with expected weakness/fatigues easily needing rest breaks with standing exercises but improved ability foot clearance for step ups from status earlier this week. Need for continued PT to address strength and gait limitations will be ongoing.    Personal Factors and Comorbidities  Age;Comorbidity 3+;Time since onset of injury/illness/exacerbation    Comorbidities  chronic pain history, LBP, recent breast CA, COPD, CHF, weakness after left hip fracture s/p IM nail, obesity    Examination-Activity Limitations  Stairs;Stand;Transfers;Squat;Lift;Locomotion Level    Examination-Participation Restrictions  Community Activity;Meal Prep;Shop;Yard Work;Laundry;Cleaning    Stability/Clinical Decision Making  Evolving/Moderate complexity    Clinical Decision Making  Moderate    Rehab Potential  Fair    PT Frequency  2x / week    PT Duration  6 weeks    PT Treatment/Interventions  ADLs/Self Care Home Management;Cryotherapy;Moist Heat;Stair training;Balance training;Functional mobility training;Therapeutic activities;Therapeutic exercise;Neuromuscular re-education;Patient/family education;Manual techniques;Passive range of  motion;Vasopneumatic Device;Taping;Gait training    PT Next Visit Plan  Continue NUSTEP, LE strengthening add/continue standing CKC exercises as tolerated, seated + mat based strengthening, further gait training with NBQC    PT Home Exercise Plan  sit<>stand - hands on thighs, heel raises, LAQ, quad sets, glut sets, LAQ with Theraband, seated clamshell with Theraband    Consulted and Agree with Plan of Care  Patient       Patient will benefit from skilled therapeutic intervention in order to improve the following deficits and impairments:  Pain, Difficulty walking, Decreased balance, Abnormal gait, Decreased range of motion, Decreased strength, Decreased activity tolerance, Impaired flexibility  Visit Diagnosis: Chronic pain of right knee  Chronic pain of left knee  Muscle weakness (generalized)  Difficulty in walking, not elsewhere classified     Problem List Patient Active Problem List   Diagnosis Date Noted  . Bacteremia due to Gram-positive bacteria 08/29/2018  . Abscess   . Cellulitis of left breast 08/28/2018  . Cellulitis of left abdominal wall 08/28/2018  . Encounter for routine gynecological examination 04/28/2018  . CKD (chronic kidney disease), stage III 09/27/2017  . Recurrent genital herpes 04/13/2017  . Chronic pain disorder 01/25/2017  . Malignant neoplasm of upper-outer quadrant of left breast in female, estrogen receptor positive ([place]) 12/16/2016  . Chronic knee pain 12/07/2016  . Chronic bilateral low back pain without sciatica 12/07/2016  . Peripheral musculoskeletal gait disorder 12/07/2016  . Encounter for therapeutic drug monitoring 06/16/2016  . Generalized osteoarthritis of multiple sites 05/04/2016  . Chronic anticoagulation 05/04/2016  . Stroke [facility])  04/14/2016  . Aortic valve disease 04/10/2016  . History of aortic valve replacement 04/10/2016  . NICM (nonischemic cardiomyopathy) ([place]) 04/10/2016  . Upper airway cough syndrome 03/14/2016  .  Morbid obesity due to excess calories ([place]) 03/14/2016  . COPD GOLD 0  03/11/2016  . Thrombocytopenia ([place]) 12/12/2015  . Anemia 12/12/2015  . Vitamin D deficiency 12/12/2015  . Acute urinary retention 12/11/2015  . Osteoporosis 12/11/2015  . Hip fracture ([place]) 12/10/2015  . Aortic atherosclerosis ([place]) 12/10/2015  . Lung blebs ([place]) 12/03/2015  . Pelvic fracture ([place]) 08/19/2015  . Fall at home 08/19/2015  . Essential hypertension 08/19/2015  . Hyperlipidemia 08/19/2015  . Asthma 08/19/2015  . Paroxysmal atrial fibrillation ([place]) 08/19/2015  . Hyperthyroidism 08/19/2015  . Chronic systolic (congestive) heart failure ([place])   . 3-vessel CAD   . Aortic stenosis     Beaulah Dinning, PT, DPT 01/17/19 11:41 AM  [facility] [address] [place], [place], [address] Phone: [phone]   Fax:  [phone]  Name: Danielle [name] MRN: [id number] Date of Birth: [birth year]

## 2019-01-18 ENCOUNTER — Ambulatory Visit (INDEPENDENT_AMBULATORY_CARE_PROVIDER_SITE_OTHER): Payer: Medicare Other

## 2019-01-18 DIAGNOSIS — I5022 Chronic systolic (congestive) heart failure: Secondary | ICD-10-CM

## 2019-01-18 DIAGNOSIS — Z952 Presence of prosthetic heart valve: Secondary | ICD-10-CM | POA: Diagnosis not present

## 2019-01-18 MED ORDER — PERFLUTREN LIPID MICROSPHERE
1.0000 mL | INTRAVENOUS | Status: AC | PRN
  Administered 2019-01-18: 2 mL via INTRAVENOUS

## 2019-01-22 ENCOUNTER — Telehealth: Payer: Self-pay | Admitting: *Deleted

## 2019-01-22 ENCOUNTER — Encounter: Payer: Self-pay | Admitting: Physical Therapy

## 2019-01-22 ENCOUNTER — Ambulatory Visit: Payer: Medicare Other | Admitting: Physical Therapy

## 2019-01-22 ENCOUNTER — Other Ambulatory Visit: Payer: Self-pay

## 2019-01-22 ENCOUNTER — Ambulatory Visit: Payer: Medicare Other | Admitting: Family Medicine

## 2019-01-22 DIAGNOSIS — M6281 Muscle weakness (generalized): Secondary | ICD-10-CM | POA: Diagnosis not present

## 2019-01-22 DIAGNOSIS — R262 Difficulty in walking, not elsewhere classified: Secondary | ICD-10-CM | POA: Diagnosis not present

## 2019-01-22 DIAGNOSIS — M25561 Pain in right knee: Secondary | ICD-10-CM

## 2019-01-22 DIAGNOSIS — R293 Abnormal posture: Secondary | ICD-10-CM | POA: Diagnosis not present

## 2019-01-22 DIAGNOSIS — G8929 Other chronic pain: Secondary | ICD-10-CM

## 2019-01-22 DIAGNOSIS — M25562 Pain in left knee: Secondary | ICD-10-CM | POA: Diagnosis not present

## 2019-01-22 NOTE — Telephone Encounter (Signed)
No answer. Left message to call back.   

## 2019-01-22 NOTE — Therapy (Signed)
Danielle [name], [place], [address] Phone: [phone]   Fax:  [phone]  Physical Therapy Treatment  Patient Details  Name: Danielle [name] MRN: [id number] Date of Birth: Dec 01, 1945 Referring Provider (PT): [name], MD   Encounter Date: 01/22/2019  PT End of Session - 01/22/19 1133    Visit Number  5    Number of Visits  12    Date for PT Re-Evaluation  02/14/19    Authorization Type  UHC Medicare    PT Start Time  1101    PT Stop Time  1144    PT Time Calculation (min)  43 min    Activity Tolerance  Patient limited by fatigue    Behavior During Therapy  [facility] for tasks assessed/performed       Past Medical History:  Diagnosis Date  . Allergy   . Anxiety   . Aortic stenosis    Status post bioprosthetic AVR  . Arthritis    DJD  . Asthma   . Breast cancer ([place]) 12/13/2016   Left breast  . Chronic systolic CHF (congestive heart failure) ([place])    EF 30% 04/2014  . COPD (chronic obstructive pulmonary disease) ([place])   . Coronary artery disease    per pt, had LHC prior to AVR in [place] that did not show any blockages; no stents/bypass  . Gastritis   . Hyperlipidemia   . Hypertension   . Hyperthyroidism   . Multiple thyroid nodules   . Osteoporosis   . Paroxysmal atrial fibrillation (HCC)   . Pelvis fracture ([place]) 08/19/2015   MULTIPLE   . Personal history of radiation therapy 2018  . Spontaneous pneumothorax 11/28/2015   left   . Stroke [facility]) 2000   rt hand weak    Past Surgical History:  Procedure Laterality Date  . BREAST LUMPECTOMY Left 12/2016  . BREAST LUMPECTOMY WITH RADIOACTIVE SEED AND SENTINEL LYMPH NODE BIOPSY Left 01/14/2017   Procedure: LEFT BREAST LUMPECTOMY WITH RADIOACTIVE SEED AND SENTINEL LYMPH NODE BIOPSY;  Surgeon: Rolm Bookbinder, MD;  Location: [place];  Service: General;  Laterality: Left;  . CHEST TUBE INSERTION  10/2015  . INTRAMEDULLARY  (IM) NAIL INTERTROCHANTERIC Left 12/10/2015   Procedure: INTRAMEDULLARY (IM) NAIL INTERTROCHANTRIC;  Surgeon: Leandrew Koyanagi, MD;  Location: [place];  Service: Orthopedics;  Laterality: Left;  . PLEURADESIS Left 12/03/2015   Procedure: PLEURADESIS;  Surgeon: Melrose Nakayama, MD;  Location: [place];  Service: Thoracic;  Laterality: Left;  . RESECTION OF APICAL BLEB Left 12/03/2015   Procedure: BLEBECTOMY;  Surgeon: Melrose Nakayama, MD;  Location: [place];  Service: Thoracic;  Laterality: Left;  . THORACOSCOPY  12/03/2015  . VALVE REPLACEMENT    . VIDEO ASSISTED THORACOSCOPY Left 12/03/2015   Procedure: VIDEO ASSISTED THORACOSCOPY;  Surgeon: Melrose Nakayama, MD;  Location: [place];  Service: Thoracic;  Laterality: Left;    There were no vitals filed for this visit.  Subjective Assessment - 01/22/19 1106    Subjective  Pt. noting right knee soreness this AM. She reports has been performong HEP regularly.    Pertinent History  chronic pain in pain management-chronic LBP with DDD, spondylosis, breast CA s/p lumpectomy and radiation, COPD, CHF, osteoporosis, a-fib, history CVA    Diagnostic tests  X-rays    Patient Stated Goals  "Walk by myself"    Currently in Pain?  Yes    Pain Score  6     Pain Location  Knee    Pain Orientation  Right    Pain Descriptors / Indicators  Aching;Burning    Pain Type  Chronic pain    Pain Onset  More than a month ago    Pain Frequency  Intermittent    Aggravating Factors   standing and walking    Pain Relieving Factors  rest, medication    Effect of Pain on Daily Activities  limits standing and walking tolerance                       [facility] Adult PT Treatment/Exercise - 01/22/19 0001      Knee/Hip Exercises: Aerobic   Nustep  L4 x 8 min      Knee/Hip Exercises: Machines for Strengthening   Cybex Leg Press  45 lbs. bilat. LE x 15 reps      Knee/Hip Exercises: Standing   Heel Raises  Both;2 sets;10 reps    Heel Raises Limitations  on  [employer]    Hip Flexion Limitations  [employer] marches x 20 at counter    Hip Abduction  AROM;Stengthening;Both;15 reps    Forward Step Up  Both;2 sets;5 reps;Hand Hold: 2;Step Height: 4"    Functional Squat Limitations  partial squat at counter x 20 reps, chair behind patient for cueing for form and safety   cues to reach hips back and avoid knee flexion past toes     Knee/Hip Exercises: Seated   Long Arc Quad  AROM;Strengthening;Both;2 sets;10 reps    Long Arc Quad Weight  4 lbs.    Hamstring Curl  AROM;Strengthening;Both;2 sets;10 reps    Hamstring Limitations  green Theraband      Knee/Hip Exercises: Supine   Short Arc Quad Sets  Both;2 sets;10 reps    Short Arc Quad Sets Limitations  3 lbs.    Hip Adduction Isometric  Strengthening;15 reps    Hip Adduction Isometric Limitations  ball squeeze in hooklying 3-5 sec holds    Bridges  2 sets;10 reps    Bridges Limitations  glut set to initiation lift off (unable to clear mat)    Other Supine Knee/Hip Exercises  clamshell green x 20 reps    Other Supine Knee/Hip Exercises  alternating march with cues PPT x 20 reps             PT Education - 01/22/19 1132    Education Details  exercises, POC    Person(s) Educated  Patient    Methods  Explanation;Demonstration    Comprehension  Verbalized understanding;Returned demonstration       PT Short Term Goals - 01/03/19 1322      PT SHORT TERM GOAL #1   Title  Indeoendent with HEP    Baseline  needs new HEP    Time  3    Period  Weeks    Status  New    Target Date  01/24/19      PT SHORT TERM GOAL #2   Title  Increase bilat. knee flexion AROM at least 5-10 deg to improve ability for sit<>stand from lower chairs and for step/stair navigation    Baseline  90 deg bilat.    Time  3    Period  Weeks    Status  New    Target Date  01/31/19        PT Long Term Goals - 01/15/19 1123      PT LONG TERM GOAL #1   Title  Increase right knee extension  strength to 5/5 to improve ability  for sit>stand from low chairs and step/stair navigation    Baseline  4+/5    Time  6    Period  Weeks    Status  On-going      PT LONG TERM GOAL #2   Title  Pt. to improve TUG and 5x sit<>stand times at least 10 seconds ea. to work towards decreased fall risk and improved functional strength for transfers    Baseline  TUG 34 sec, 5x sit<>stand 36 sec    Time  6    Period  Weeks    Status  On-going      PT LONG TERM GOAL #3   Title  Improve FOTO outcome measure score to 47% or less impairment    Baseline  63% limited    Time  6    Period  Weeks    Status  On-going      PT LONG TERM GOAL #4   Title  Ambulate 100 feet or greater CGA-SBA with SPC for progression to ability for gait with LRAD    Baseline  25 feet min assist with NBQC today    Time  6    Period  Weeks    Status  On-going            Plan - 01/22/19 1140    Clinical Impression Statement  Mild improvements in activity tolerance from baseline and slow improvement with ability CKC exercises during therapy. Still fatigues easily requiring frequent rest breaks-expect progress will be slow/gradual given chronicity of symptoms along with medical comorbdities.    Personal Factors and Comorbidities  Age;Comorbidity 3+;Time since onset of injury/illness/exacerbation    Comorbidities  chronic pain history, LBP, recent breast CA, COPD, CHF, weakness after left hip fracture s/p IM nail, obesity    Examination-Activity Limitations  Stairs;Stand;Transfers;Squat;Lift;Locomotion Level    Examination-Participation Restrictions  Community Activity;Meal Prep;Shop;Yard Work;Laundry;Cleaning    Stability/Clinical Decision Making  Evolving/Moderate complexity    Clinical Decision Making  Moderate    Rehab Potential  Fair    PT Frequency  2x / week    PT Duration  6 weeks    PT Treatment/Interventions  ADLs/Self Care Home Management;Cryotherapy;Moist Heat;Stair training;Balance training;Functional mobility training;Therapeutic  activities;Therapeutic exercise;Neuromuscular re-education;Patient/family education;Manual techniques;Passive range of motion;Vasopneumatic Device;Taping;Gait training    PT Next Visit Plan  Continue NUSTEP, LE strengthening add/continue standing CKC exercises as tolerated, seated + mat based strengthening, further gait training with NBQC    PT Home Exercise Plan  sit<>stand - hands on thighs, heel raises, LAQ, quad sets, glut sets, LAQ with Theraband, seated clamshell with Theraband    Consulted and Agree with Plan of Care  Patient       Patient will benefit from skilled therapeutic intervention in order to improve the following deficits and impairments:  Pain, Difficulty walking, Decreased balance, Abnormal gait, Decreased range of motion, Decreased strength, Decreased activity tolerance, Impaired flexibility  Visit Diagnosis: Chronic pain of right knee  Chronic pain of left knee  Muscle weakness (generalized)  Difficulty in walking, not elsewhere classified     Problem List Patient Active Problem List   Diagnosis Date Noted  . Bacteremia due to Gram-positive bacteria 08/29/2018  . Abscess   . Cellulitis of left breast 08/28/2018  . Cellulitis of left abdominal wall 08/28/2018  . Encounter for routine gynecological examination 04/28/2018  . CKD (chronic kidney disease), stage III 09/27/2017  . Recurrent genital herpes 04/13/2017  . Chronic pain disorder 01/25/2017  .  Malignant neoplasm of upper-outer quadrant of left breast in female, estrogen receptor positive ([place]) 12/16/2016  . Chronic knee pain 12/07/2016  . Chronic bilateral low back pain without sciatica 12/07/2016  . Peripheral musculoskeletal gait disorder 12/07/2016  . Encounter for therapeutic drug monitoring 06/16/2016  . Generalized osteoarthritis of multiple sites 05/04/2016  . Chronic anticoagulation 05/04/2016  . Stroke ([place]) 04/14/2016  . Aortic valve disease 04/10/2016  . History of aortic valve replacement  04/10/2016  . NICM (nonischemic cardiomyopathy) ([place]) 04/10/2016  . Upper airway cough syndrome 03/14/2016  . Morbid obesity due to excess calories ([place]) 03/14/2016  . COPD GOLD 0  03/11/2016  . Thrombocytopenia ([place]) 12/12/2015  . Anemia 12/12/2015  . Vitamin D deficiency 12/12/2015  . Acute urinary retention 12/11/2015  . Osteoporosis 12/11/2015  . Hip fracture ([place]) 12/10/2015  . Aortic atherosclerosis ([place]) 12/10/2015  . Lung blebs ([place]) 12/03/2015  . Pelvic fracture ([place]) 08/19/2015  . Fall at home 08/19/2015  . Essential hypertension 08/19/2015  . Hyperlipidemia 08/19/2015  . Asthma 08/19/2015  . Paroxysmal atrial fibrillation ([place]) 08/19/2015  . Hyperthyroidism 08/19/2015  . Chronic systolic (congestive) heart failure ([place])   . 3-vessel CAD   . Aortic stenosis     Beaulah Dinning, PT, DPT 01/22/19 11:47 AM  [facility] [address] [place], [place], [address] Phone: [phone]   Fax:  [phone]  Name: Danielle [name] MRN: [id number] Date of Birth: 07-31-1945

## 2019-01-22 NOTE — Telephone Encounter (Signed)
-----   Message from [name], MD sent at 01/22/2019  1:48 PM EST ----- Please let [name] know that her echo has not changed significantly compared with the prior study in 2018.  The aortic valve replacement is functioning well.  Her heart is still moderately weakened with an LVEF of 35-40%.  She should continue her current medications and follow-up as previously arranged.

## 2019-01-23 NOTE — Telephone Encounter (Signed)
No answer. Left message to call back.   

## 2019-01-24 ENCOUNTER — Encounter: Payer: Self-pay | Admitting: Physical Therapy

## 2019-01-24 ENCOUNTER — Encounter: Payer: Self-pay | Admitting: *Deleted

## 2019-01-24 ENCOUNTER — Ambulatory Visit: Payer: Medicare Other | Admitting: Physical Therapy

## 2019-01-24 ENCOUNTER — Other Ambulatory Visit: Payer: Self-pay

## 2019-01-24 VITALS — HR 92

## 2019-01-24 DIAGNOSIS — G8929 Other chronic pain: Secondary | ICD-10-CM | POA: Diagnosis not present

## 2019-01-24 DIAGNOSIS — M25561 Pain in right knee: Secondary | ICD-10-CM | POA: Diagnosis not present

## 2019-01-24 DIAGNOSIS — R262 Difficulty in walking, not elsewhere classified: Secondary | ICD-10-CM | POA: Diagnosis not present

## 2019-01-24 DIAGNOSIS — M6281 Muscle weakness (generalized): Secondary | ICD-10-CM | POA: Diagnosis not present

## 2019-01-24 DIAGNOSIS — R293 Abnormal posture: Secondary | ICD-10-CM | POA: Diagnosis not present

## 2019-01-24 DIAGNOSIS — M25562 Pain in left knee: Secondary | ICD-10-CM | POA: Diagnosis not present

## 2019-01-24 NOTE — Telephone Encounter (Signed)
Patient calling back. Results given. Pt verbalized understanding.

## 2019-01-24 NOTE — Therapy (Signed)
[place], [place], [address] Phone: [phone]   Fax:  [phone]  Physical Therapy Treatment  Patient Details  Name: Danielle [name] MRN: [id number] Date of Birth: 07/12/1945 Referring Provider (PT): [name], MD   Encounter Date: 01/24/2019  PT End of Session - 01/24/19 1334    Visit Number  6    Number of Visits  12    Date for PT Re-Evaluation  02/14/19    Authorization Type  [employer] Medicare    PT Start Time  1325    PT Stop Time  1407    PT Time Calculation (min)  42 min    Activity Tolerance  Patient limited by fatigue    Behavior During Therapy  [facility] for tasks assessed/performed       Past Medical History:  Diagnosis Date  . Allergy   . Anxiety   . Aortic stenosis    Status post bioprosthetic AVR  . Arthritis    DJD  . Asthma   . Breast cancer ([place]) 12/13/2016   Left breast  . Chronic systolic CHF (congestive heart failure) ([place])    EF 30% 04/2014  . COPD (chronic obstructive pulmonary disease) ([place])   . Coronary artery disease    per pt, had LHC prior to AVR in [place] that did not show any blockages; no stents/bypass  . Gastritis   . Hyperlipidemia   . Hypertension   . Hyperthyroidism   . Multiple thyroid nodules   . Osteoporosis   . Paroxysmal atrial fibrillation ([facility])   . Pelvis fracture ([place]) 08/19/2015   MULTIPLE   . Personal history of radiation therapy 2018  . Spontaneous pneumothorax 11/28/2015   left   . Stroke [facility]) 2000   rt hand weak    Past Surgical History:  Procedure Laterality Date  . BREAST LUMPECTOMY Left 12/2016  . BREAST LUMPECTOMY WITH RADIOACTIVE SEED AND SENTINEL LYMPH NODE BIOPSY Left 01/14/2017   Procedure: LEFT BREAST LUMPECTOMY WITH RADIOACTIVE SEED AND SENTINEL LYMPH NODE BIOPSY;  Surgeon: Rolm Bookbinder, MD;  Location: [place];  Service: General;  Laterality: Left;  . CHEST TUBE INSERTION  10/2015  . INTRAMEDULLARY  (IM) NAIL INTERTROCHANTERIC Left 12/10/2015   Procedure: INTRAMEDULLARY (IM) NAIL INTERTROCHANTRIC;  Surgeon: Leandrew Koyanagi, MD;  Location: [place];  Service: Orthopedics;  Laterality: Left;  . PLEURADESIS Left 12/03/2015   Procedure: PLEURADESIS;  Surgeon: Melrose Nakayama, MD;  Location: [place];  Service: Thoracic;  Laterality: Left;  . RESECTION OF APICAL BLEB Left 12/03/2015   Procedure: BLEBECTOMY;  Surgeon: Melrose Nakayama, MD;  Location: [place];  Service: Thoracic;  Laterality: Left;  . THORACOSCOPY  12/03/2015  . VALVE REPLACEMENT    . VIDEO ASSISTED THORACOSCOPY Left 12/03/2015   Procedure: VIDEO ASSISTED THORACOSCOPY;  Surgeon: Melrose Nakayama, MD;  Location: [place];  Service: Thoracic;  Laterality: Left;    Vitals:   01/24/19 1345  Pulse: 92  SpO2: 97%    Subjective Assessment - 01/24/19 1326    Subjective  Pt. reports got echo results back from cardiologist and was informed "valves OK but my heart is still weak."    Pertinent History  chronic pain in pain management-chronic LBP with DDD, spondylosis, breast CA s/p lumpectomy and radiation, COPD, CHF, osteoporosis, a-fib, history CVA    Diagnostic tests  X-rays    Patient Stated Goals  "Walk by myself"         [facility] PT Assessment -  01/24/19 0001      Ambulation/Gait   Gait Comments  TUG x 25 seconds                   [facility] Adult PT Treatment/Exercise - 01/24/19 0001      Knee/Hip Exercises: Aerobic   Nustep  L4 x 8 min      Knee/Hip Exercises: Standing   Heel Raises  Both;2 sets;10 reps    Heel Raises Limitations  on Airex    Hip Flexion Limitations  Airex marches x 20 at counter    Hip Abduction  AROM;Stengthening;Both;15 reps    Forward Step Up  Both;10 reps;Hand Hold: 2;Step Height: 2"    Functional Squat Limitations  partial squat at counter x 20 reps, chair behind patient for cueing for form and safety   cues to reach hips back and avoid knee flexion past toes     Knee/Hip Exercises:  Seated   Long Arc Quad  AROM;Strengthening;Both;2 sets;10 reps    Long Arc Quad Weight  5 lbs.    Hamstring Curl  AROM;Strengthening;Both;2 sets;10 reps    Hamstring Limitations  Blue Theraband      Knee/Hip Exercises: Supine   Short Arc Quad Sets  Both;2 sets;10 reps    Short Arc Quad Sets Limitations  3 lbs.    Hip Adduction Isometric  Strengthening;15 reps    Hip Adduction Isometric Limitations  ball squeeze in hooklying x 5 sec holds    Bridges  2 sets;10 reps    Bridges Limitations  glut set to initiation lift off (unable to clear mat)    Other Supine Knee/Hip Exercises  clamshell blue band x 20 reps    Other Supine Knee/Hip Exercises  alternating march with cues PPT x 20 reps             PT Education - 01/24/19 1339    Education Details  exercises, POC    Person(s) Educated  Patient    Methods  Explanation;Demonstration    Comprehension  Verbalized understanding;Returned demonstration       PT Short Term Goals - 01/03/19 1322      PT SHORT TERM GOAL #1   Title  Indeoendent with HEP    Baseline  needs new HEP    Time  3    Period  Weeks    Status  New    Target Date  01/24/19      PT SHORT TERM GOAL #2   Title  Increase bilat. knee flexion AROM at least 5-10 deg to improve ability for sit<>stand from lower chairs and for step/stair navigation    Baseline  90 deg bilat.    Time  3    Period  Weeks    Status  New    Target Date  01/31/19        PT Long Term Goals - 01/24/19 1334      PT LONG TERM GOAL #1   Title  Increase right knee extension strength to 5/5 to improve ability for sit>stand from low chairs and step/stair navigation    Baseline  4+/5    Time  6    Period  Weeks    Status  On-going      PT LONG TERM GOAL #2   Title  Pt. to improve TUG and 5x sit<>stand times at least 10 seconds ea. to work towards decreased fall risk and improved functional strength for transfers    Baseline  TUG 25 sec (34  sec at eval), 5x sit<>stand 36 sec at eval  not retested today    Time  6    Period  Weeks    Status  On-going      PT LONG TERM GOAL #3   Title  Improve FOTO outcome measure score to 47% or less impairment    Baseline  63% limited    Time  6    Period  Weeks    Status  On-going      PT LONG TERM GOAL #4   Title  Ambulate 100 feet or greater CGA-SBA with SPC for progression to ability for gait with LRAD    Baseline  25 feet min assist with NBQC during PT at last trial gait, uses RW for home and community mobility    Time  6    Period  Weeks    Status  On-going            Plan - 01/24/19 1359    Clinical Impression Statement  Rechecked TUG with pt. demonstrating 9 dec improvement from baseline though with score (25 sec) still suggestive of high fall risk. As previously pt. fatigues easily, still limited with weakness but making slow improvements from baseline status with strength and activity tolerance gains.    Personal Factors and Comorbidities  Age;Comorbidity 3+;Time since onset of injury/illness/exacerbation    Comorbidities  chronic pain history, LBP, recent breast CA, COPD, CHF, weakness after left hip fracture s/p IM nail, obesity    Examination-Activity Limitations  Stairs;Stand;Transfers;Squat;Lift;Locomotion Level    Examination-Participation Restrictions  Community Activity;Meal Prep;Shop;Yard Work;Laundry;Cleaning    Stability/Clinical Decision Making  Evolving/Moderate complexity    Clinical Decision Making  Moderate    Rehab Potential  Fair    PT Frequency  2x / week    PT Duration  6 weeks    PT Treatment/Interventions  ADLs/Self Care Home Management;Cryotherapy;Moist Heat;Stair training;Balance training;Functional mobility training;Therapeutic activities;Therapeutic exercise;Neuromuscular re-education;Patient/family education;Manual techniques;Passive range of motion;Vasopneumatic Device;Taping;Gait training    PT Next Visit Plan  Continue NUSTEP, LE strengthening add/continue standing CKC exercises as  tolerated, seated + mat based strengthening, further gait training with NBQC    PT Home Exercise Plan  sit<>stand - hands on thighs, heel raises, LAQ, quad sets, glut sets, LAQ with Theraband, seated clamshell with Theraband    Consulted and Agree with Plan of Care  Patient       Patient will benefit from skilled therapeutic intervention in order to improve the following deficits and impairments:  Pain, Difficulty walking, Decreased balance, Abnormal gait, Decreased range of motion, Decreased strength, Decreased activity tolerance, Impaired flexibility  Visit Diagnosis: Chronic pain of right knee  Chronic pain of left knee  Muscle weakness (generalized)  Difficulty in walking, not elsewhere classified     Problem List Patient Active Problem List   Diagnosis Date Noted  . Bacteremia due to Gram-positive bacteria 08/29/2018  . Abscess   . Cellulitis of left breast 08/28/2018  . Cellulitis of left abdominal wall 08/28/2018  . Encounter for routine gynecological examination 04/28/2018  . CKD (chronic kidney disease), stage III 09/27/2017  . Recurrent genital herpes 04/13/2017  . Chronic pain disorder 01/25/2017  . Malignant neoplasm of upper-outer quadrant of left breast in female, estrogen receptor positive ([place]) 12/16/2016  . Chronic knee pain 12/07/2016  . Chronic bilateral low back pain without sciatica 12/07/2016  . Peripheral musculoskeletal gait disorder 12/07/2016  . Encounter for therapeutic drug monitoring 06/16/2016  . Generalized osteoarthritis of multiple sites 05/04/2016  .  Chronic anticoagulation 05/04/2016  . Stroke ([place]) 04/14/2016  . Aortic valve disease 04/10/2016  . History of aortic valve replacement 04/10/2016  . NICM (nonischemic cardiomyopathy) ([place]) 04/10/2016  . Upper airway cough syndrome 03/14/2016  . Morbid obesity due to excess calories ([place]) 03/14/2016  . COPD GOLD 0  03/11/2016  . Thrombocytopenia ([place]) 12/12/2015  . Anemia 12/12/2015  .  Vitamin D deficiency 12/12/2015  . Acute urinary retention 12/11/2015  . Osteoporosis 12/11/2015  . Hip fracture ([place]) 12/10/2015  . Aortic atherosclerosis ([place]) 12/10/2015  . Lung blebs ([place]) 12/03/2015  . Pelvic fracture ([place]) 08/19/2015  . Fall at home 08/19/2015  . Essential hypertension 08/19/2015  . Hyperlipidemia 08/19/2015  . Asthma 08/19/2015  . Paroxysmal atrial fibrillation ([place]) 08/19/2015  . Hyperthyroidism 08/19/2015  . Chronic systolic (congestive) heart failure ([place])   . 3-vessel CAD   . Aortic stenosis     Beaulah Dinning, PT, DPT 01/24/19 2:08 PM  [place] [facility] [address] [place], [place], [address] Phone: [phone]   Fax:  [phone]  Name: Vivyan Wythe MRN: [id number] Date of Birth: Feb 18, 1946

## 2019-01-24 NOTE — Telephone Encounter (Signed)
3rd attempt to call results to patient. No answer. Letter mailed to patient.

## 2019-01-26 ENCOUNTER — Other Ambulatory Visit: Payer: Medicare Other

## 2019-01-28 ENCOUNTER — Other Ambulatory Visit: Payer: Self-pay | Admitting: General Practice

## 2019-01-28 DIAGNOSIS — Z7901 Long term (current) use of anticoagulants: Secondary | ICD-10-CM

## 2019-01-28 NOTE — Progress Notes (Unsigned)
pt

## 2019-01-29 ENCOUNTER — Encounter: Payer: Self-pay | Admitting: Family Medicine

## 2019-01-29 ENCOUNTER — Ambulatory Visit: Payer: Medicare Other | Admitting: Physical Therapy

## 2019-01-29 ENCOUNTER — Other Ambulatory Visit: Payer: Self-pay

## 2019-01-29 ENCOUNTER — Ambulatory Visit: Payer: Medicare Other

## 2019-01-29 ENCOUNTER — Ambulatory Visit (INDEPENDENT_AMBULATORY_CARE_PROVIDER_SITE_OTHER): Payer: Medicare Other | Admitting: Family Medicine

## 2019-01-29 VITALS — BP 140/80 | HR 94 | Temp 97.5°F | Resp 16 | Ht 67.0 in | Wt 259.8 lb

## 2019-01-29 DIAGNOSIS — M810 Age-related osteoporosis without current pathological fracture: Secondary | ICD-10-CM

## 2019-01-29 DIAGNOSIS — I10 Essential (primary) hypertension: Secondary | ICD-10-CM

## 2019-01-29 DIAGNOSIS — E785 Hyperlipidemia, unspecified: Secondary | ICD-10-CM

## 2019-01-29 DIAGNOSIS — E0590 Thyrotoxicosis, unspecified without thyrotoxic crisis or storm: Secondary | ICD-10-CM

## 2019-01-29 DIAGNOSIS — E559 Vitamin D deficiency, unspecified: Secondary | ICD-10-CM | POA: Diagnosis not present

## 2019-01-29 DIAGNOSIS — I700 Atherosclerosis of aorta: Secondary | ICD-10-CM

## 2019-01-29 DIAGNOSIS — N1831 Chronic kidney disease, stage 3a: Secondary | ICD-10-CM | POA: Diagnosis not present

## 2019-01-29 DIAGNOSIS — Z0000 Encounter for general adult medical examination without abnormal findings: Secondary | ICD-10-CM | POA: Diagnosis not present

## 2019-01-29 LAB — LIPID PANEL
Cholesterol: 161 mg/dL (ref 0–200)
HDL: 64.9 mg/dL (ref >39.00)
LDL Cholesterol: 81 mg/dL (ref 0–99)
NonHDL: 96.04
Total CHOL/HDL Ratio: 2
Triglycerides: 77 mg/dL (ref 0.0–149.0)
VLDL: 15.4 mg/dL (ref 0.0–40.0)

## 2019-01-29 LAB — TSH: TSH: 0.55 u[IU]/mL (ref 0.35–4.50)

## 2019-01-29 LAB — T4, FREE: Free T4: 0.91 ng/dL (ref 0.60–1.60)

## 2019-01-29 LAB — VITAMIN D 25 HYDROXY (VIT D DEFICIENCY, FRACTURES): VITD: 39.12 ng/mL (ref 30.00–100.00)

## 2019-01-29 MED ORDER — METHIMAZOLE 5 MG PO TABS: ORAL_TABLET | ORAL | 3 refills | Status: DC

## 2019-01-29 NOTE — Assessment & Plan Note (Signed)
Continue vitamin D 1000 units daily. Further recommendation will be given according to 25 OH vitamin D results.

## 2019-01-29 NOTE — Assessment & Plan Note (Signed)
No changes in current management, will follow TSH done today and will give further recommendations accordingly.  

## 2019-01-29 NOTE — Assessment & Plan Note (Signed)
Continue Crestor 40 mg daily and low-fat diet. Further recommendations according to lipid panel results.

## 2019-01-29 NOTE — Assessment & Plan Note (Signed)
Otherwise adequately controlled, reporting lower BP readings at home. Continue monitoring BP regularly. No changes in current management. Continue low-salt diet. Eye exam is current.

## 2019-01-29 NOTE — Assessment & Plan Note (Signed)
We discussed benefits of wt loss as well as adverse effects of obesity. Consistency with healthy diet and physical activity as tolerated recommended. Recommend keeping a food diary.

## 2019-01-29 NOTE — Assessment & Plan Note (Signed)
Continue Crestor 40 mg daily and Aspirin 81 mg daily.

## 2019-01-29 NOTE — Progress Notes (Signed)
HPI:   [name] is a 73 y.o. female, who is here today for her routine physical and follow up.  Last CPE: 01/07/2018.  Frustrated because weight gain  She cannot exercise regularly due to chronic pain but she is doing PT after times per week and she repeats exercises at home daily. She thinks she is following a healthful diet in general. She lives with her husband.  Chronic medical problems: COPD,HFrEF, hypertension, hyperlipidemia, chronic pain (generalized OA), CKD 3, hypothyroidism, chronic anticoagulation, and osteoporosis among some.  She follows with gynecology regularly, last gynecologic visit on 04/28/2018. Left breast cancer, she follows with Dr. Burr Medico, last seen in 12/2017. S/P left breast lumpectomy.  Immunization History  Administered Date(s) Administered  . Fluad Quad(high Dose 65+) 11/20/2018  . Influenza, High Dose Seasonal PF 11/24/2016, 12/08/2017  . Influenza-Unspecified 12/07/2015  . Pneumococcal Conjugate-13 11/21/2014  . Pneumococcal Polysaccharide-23 05/20/2016  . Zoster 03/01/2014    Mammogram: 12/18/2018. Colonoscopy: 5 years, per patient report.  We do not have a copy of last colonoscopy report but she states that he was normal. DEXA: 11/29/2018, osteoporosis.  Currently she is on Fosamax 70 mg weekly.  She has been on pharmacologic treatment for about 2-3 years,started in 04/2016.  Hep C screening: 01/04/18 NR  Chronic disease management  Hyperlipidemia: Currently she is on Crestor 40 mg daily. History of CVA. She is also following low-fat diet.  Lab Results  Component Value Date   CHOL 154 09/28/2017   HDL 64.10 09/28/2017   LDLCALC 71 09/28/2017   TRIG 94.0 09/28/2017   CHOLHDL 2 09/28/2017   Hypertension: Negative for unusual headache, visual changes, chest pain, or dyspnea.  HFrEF, last echo on 01/18/2019 showed LVEF 35-40% and grade 1 diastolic dysfunction. Currently she is on losartan 50 mg daily and  carvedilol 25 mg twice daily. She checks BP at home,< 140/90.  Aortic valve disease (severe regurgitation) s/p bioprosthetic aortic valve placement in 2016.  Denies orthopnea or PND. Lower extremity edema, worse at the end of the day. Currently she is on furosemide 20 mg daily. She follows with cardiologist, Dr. Saunders Revel.  CKD III:She has not noted form in urine or decreased urine output. Paroxysmal atrial fibrillation and CVA x2. She is on coumadin 5 mg x 6 and 3 mg Wednesday.  She follows with Coumadin clinic.  Lab Results  Component Value Date   CREATININE 1.20 (H) 11/20/2018   BUN 14 11/20/2018   NA 142 11/20/2018   K 4.5 11/20/2018   CL 111 11/20/2018   CO2 24 11/20/2018   Hyperthyroidism: Currently she is on methimazole 15 mg at bedtime. She has not noticed palpitations, tremor, or changes in bowel habits.  Lab Results  Component Value Date   TSH 2.39 01/04/2018    Chronic pain, generalized OA, especially knees. Currently she is on tramadol 50 mg 3 times daily. She follows with Dr. Cyril Mourning.  COPD: Currently she is on Symbicort 160-4.5 mcg 2 puff every 12 hours. She has not needed albuterol inhaler.  Review of Systems  Constitutional: Positive for fatigue. Negative for activity change, appetite change and fever.  HENT: Negative for congestion, mouth sores, sore throat and trouble swallowing.   Eyes: Negative for photophobia and redness.  Respiratory: Negative for cough and wheezing.   Cardiovascular: Positive for leg swelling.  Gastrointestinal: Negative for abdominal pain, nausea and vomiting.  Endocrine: Negative for cold intolerance and heat intolerance.  Genitourinary: Negative for dysuria, hematuria, vaginal bleeding and vaginal  discharge.  Musculoskeletal: Positive for arthralgias and gait problem. Negative for myalgias.  Skin: Negative for color change and rash.  Allergic/Immunologic: Positive for environmental allergies.  Neurological: Negative for syncope,  weakness and headaches.  Psychiatric/Behavioral: Negative for confusion and sleep disturbance. The patient is nervous/anxious.   All other systems reviewed and are negative.   Current Outpatient Medications on File Prior to Visit  Medication Sig Dispense Refill  . acetaminophen (TYLENOL) 500 MG tablet Take 1,000 mg by mouth daily as needed (PAIN).    Marland Kitchen albuterol (PROVENTIL HFA;VENTOLIN HFA) 108 (90 Base) MCG/ACT inhaler Inhale 2 puffs into the lungs every 6 (six) hours as needed for wheezing or shortness of breath.     Marland Kitchen alendronate (FOSAMAX) 70 MG tablet Take 1 tablet (70 mg total) by mouth once a week. Take with a full glass of water on an empty stomach. 13 tablet 3  . aspirin EC 81 MG tablet Take 81 mg daily by mouth.    . budesonide-formoterol (SYMBICORT) 160-4.5 MCG/ACT inhaler Inhale 2 puffs into the lungs 2 (two) times daily. 1 Inhaler 0  . carvedilol (COREG) 25 MG tablet Take 1 tablet by mouth twice daily 180 tablet 1  . Cholecalciferol (VITAMIN D3) 400 units tablet Take 1 tablet (400 Units total) by mouth daily. 30 tablet 0  . diclofenac sodium (VOLTAREN) 1 % GEL Apply 4 g topically 4 (four) times daily. 500 g 3  . diphenhydramine-acetaminophen (TYLENOL PM) 25-500 MG TABS tablet Take 2 tablets by mouth at bedtime.     . furosemide (LASIX) 20 MG tablet Take 1 tablet (20 mg total) by mouth daily. 90 tablet 2  . losartan (COZAAR) 50 MG tablet Take 1 tablet by mouth once daily 90 tablet 1  . rosuvastatin (CRESTOR) 40 MG tablet Take 1 tablet by mouth once daily 90 tablet 0  . senna-docusate (SENOKOT-S) 8.6-50 MG tablet Take 2 tablets at bedtime by mouth.     . tamoxifen (NOLVADEX) 20 MG tablet Take 1 tablet by mouth once daily 90 tablet 0  . traMADol (ULTRAM) 50 MG tablet Take 1 tablet (50 mg total) by mouth 3 (three) times daily. 90 tablet 5  . valACYclovir (VALTREX) 500 MG tablet Take 1 tablet (500 mg total) by mouth 2 (two) times daily. For 3 days when outbreaks. (Patient taking  differently: Take 500 mg by mouth 2 (two) times daily as needed (outbreaks). For 3 days when outbreaks.) 24 tablet 1  . warfarin (COUMADIN) 1 MG tablet 1 tab wednesdays (Patient taking differently: Take 1 mg by mouth once a week. Take 1 tablet (1 mg) Only on Wednesday) 20 tablet 2  . warfarin (COUMADIN) 3 MG tablet TAKE AS DIRECTED BY  ANTICOAGULATION  CLINIC (Patient taking differently: Take 3 mg by mouth as directed. Take 1 tablet (3 mg) daily except on Wednesday. TAKE AS DIRECTED BY  ANTICOAGULATION  CLINIC) 90 tablet 1   No current facility-administered medications on file prior to visit.      Past Medical History:  Diagnosis Date  . Allergy   . Anxiety   . Aortic stenosis    Status post bioprosthetic AVR  . Arthritis    DJD  . Asthma   . Breast cancer ([place]) 12/13/2016   Left breast  . Chronic systolic CHF (congestive heart failure) ([place])    EF 30% 04/2014  . COPD (chronic obstructive pulmonary disease) ([place])   . Coronary artery disease    per pt, had LHC prior to AVR in [place]  2016 that did not show any blockages; no stents/bypass  . Gastritis   . Hyperlipidemia   . Hypertension   . Hyperthyroidism   . Multiple thyroid nodules   . Osteoporosis   . Paroxysmal atrial fibrillation (HCC)   . Pelvis fracture ([place]) 08/19/2015   MULTIPLE   . Personal history of radiation therapy 2018  . Spontaneous pneumothorax 11/28/2015   left   . Stroke [facility]) 2000   rt hand weak    Past Surgical History:  Procedure Laterality Date  . BREAST LUMPECTOMY Left 12/2016  . BREAST LUMPECTOMY WITH RADIOACTIVE SEED AND SENTINEL LYMPH NODE BIOPSY Left 01/14/2017   Procedure: LEFT BREAST LUMPECTOMY WITH RADIOACTIVE SEED AND SENTINEL LYMPH NODE BIOPSY;  Surgeon: Rolm Bookbinder, MD;  Location: [facility];  Service: General;  Laterality: Left;  . CHEST TUBE INSERTION  10/2015  . INTRAMEDULLARY (IM) NAIL INTERTROCHANTERIC Left 12/10/2015   Procedure: INTRAMEDULLARY (IM) NAIL INTERTROCHANTRIC;   Surgeon: Leandrew Koyanagi, MD;  Location: [facility];  Service: Orthopedics;  Laterality: Left;  . PLEURADESIS Left 12/03/2015   Procedure: PLEURADESIS;  Surgeon: Melrose Nakayama, MD;  Location: [facility];  Service: Thoracic;  Laterality: Left;  . RESECTION OF APICAL BLEB Left 12/03/2015   Procedure: BLEBECTOMY;  Surgeon: Melrose Nakayama, MD;  Location: [facility];  Service: Thoracic;  Laterality: Left;  . THORACOSCOPY  12/03/2015  . VALVE REPLACEMENT    . VIDEO ASSISTED THORACOSCOPY Left 12/03/2015   Procedure: VIDEO ASSISTED THORACOSCOPY;  Surgeon: Melrose Nakayama, MD;  Location: [facility];  Service: Thoracic;  Laterality: Left;    Allergies  Allergen Reactions  . Lisinopril Cough  . Tetanus Toxoid Adsorbed Swelling    Arm swelling    Family History  Problem Relation Age of Onset  . Diabetes Mother   . Heart attack Mother 3  . Diabetes Father   . Lung cancer Father   . Diabetes Sister   . Thyroid disease Sister   . Diabetes Sister   . HIV Brother     Social History   Socioeconomic History  . Marital status: Married    Spouse name: Sherwood  . Number of children: 0  . Years of education: Not on file  . Highest education level: Not on file  Occupational History  . Occupation: Retired in 2004  Social Needs  . Financial resource strain: Not on file  . Food insecurity    Worry: Not on file    Inability: Not on file  . Transportation needs    Medical: Not on file    Non-medical: Not on file  Tobacco Use  . Smoking status: Former Smoker    Packs/day: 0.25    Years: 10.00    Pack years: 2.50    Types: Cigarettes    Quit date: 03/01/2010    Years since quitting: 8.9  . Smokeless tobacco: Never Used  Substance and Sexual Activity  . Alcohol use: No  . Drug use: No  . Sexual activity: Not Currently  Lifestyle  . Physical activity    Days per week: Not on file    Minutes per session: Not on file  . Stress: Not on file  Relationships  . Social Herbalist on  phone: Not on file    Gets together: Not on file    Attends religious service: Not on file    Active member of club or organization: Not on file    Attends meetings of clubs or organizations: Not on  file    Relationship status: Not on file  Other Topics Concern  . Not on file  Social History Narrative   Lives with husband.  Ambulated independently.    Vitals:   01/29/19 1148  BP: 140/80  Pulse: 94  Temp: (!) 97.5 F (36.4 C)  SpO2: 97%   Body mass index is 40.69 kg/m.   Wt Readings from Last 3 Encounters:  01/29/19 259 lb 12.8 oz (117.8 kg)  12/26/18 258 lb (117 kg)  11/20/18 222 lb (100.7 kg)    Physical Exam  Nursing note and vitals reviewed. Constitutional: She is oriented to person, place, and time. She appears well-developed. No distress.  HENT:  Head: Normocephalic and atraumatic.  Right Ear: Hearing, tympanic membrane, external ear and ear canal normal.  Left Ear: Hearing, tympanic membrane, external ear and ear canal normal.  Mouth/Throat: Uvula is midline, oropharynx is clear and moist and mucous membranes are normal.  Eyes: Pupils are equal, round, and reactive to light. Conjunctivae are normal.  Neck: No tracheal deviation present. Thyromegaly present.  Cardiovascular: Normal rate and regular rhythm.  Murmur (SEM I/VI RUSB-LUSB) heard. Pulses:      Posterior tibial pulses are 2+ on the right side and 2+ on the left side.  Respiratory: Effort normal and breath sounds normal. No respiratory distress.  GI: Soft. She exhibits no mass. There is no abdominal tenderness.  Genitourinary:    Genitourinary Comments: Deferred to gyn.   Musculoskeletal:        General: Edema (1+ LE pitting edema,bilateral.) present.     Comments: No signs of synovitis appreciated.  Lymphadenopathy:    She has no cervical adenopathy.  Neurological: She is alert and oriented to person, place, and time. She has normal strength. No cranial nerve deficit.  Skin: Skin is warm. No rash  noted. No erythema.  Psychiatric: Her mood appears anxious.  Well groomed, good eye contact.    ASSESSMENT AND PLAN:  Ms. Coryn Resendes was here today annual physical examination.  Orders Placed This Encounter  Procedures  . T4, free  . TSH  . Lipid panel  . VITAMIN D 25 Hydroxy (Vit-D Deficiency, Fractures)  . Microalbumin / creatinine urine ratio    Lab Results  Component Value Date   TSH 0.55 01/29/2019   Lab Results  Component Value Date   MICROALBUR 1.9 01/04/2018   MICROALBUR <0.7 10/05/2017   Lab Results  Component Value Date   CHOL 161 01/29/2019   HDL 64.90 01/29/2019   LDLCALC 81 01/29/2019   TRIG 77.0 01/29/2019   CHOLHDL 2 01/29/2019    Routine general medical examination at a health care facility We discussed the importance of regular physical activity and healthy diet for prevention of chronic illness and/or complications. Preventive guidelines reviewed. Vaccination up-to-date. We will try to obtain colonoscopy report, she will sign a release form. Ca++ and vit D supplementation to continue. Next CPE in a year.  Essential hypertension Otherwise adequately controlled, reporting lower BP readings at home. Continue monitoring BP regularly. No changes in current management. Continue low-salt diet. Eye exam is current.   Hyperlipidemia Continue Crestor 40 mg daily and low-fat diet. Further recommendations according to lipid panel results.  Osteoporosis Tolerating Fosamax well, planning on completing 5 years treatment. No changes in current management. Continue calcium and vitamin D supplementation. Fall prevention.   CKD (chronic kidney disease), stage III (HCC) Continue losartan 50 mg daily. Low-salt diet and avoidance of oral NSAIDs. Adequate hydration and BP control.  Hyperthyroidism No changes in current management, will follow TSH done today and will give further recommendations accordingly.   Morbid obesity due to  excess calories ([place]) We discussed benefits of wt loss as well as adverse effects of obesity. Consistency with healthy diet and physical activity as tolerated recommended. Recommend keeping a food diary.  Vitamin D deficiency Continue vitamin D 1000 units daily. Further recommendation will be given according to 25 OH vitamin D results.  Aortic atherosclerosis (HCC) Continue Crestor 40 mg daily and Aspirin 81 mg daily.   Return in 6 months (on 07/29/2019) for Medicare virtual.    G. [name], MD  [facility]. [place] office.

## 2019-01-29 NOTE — Assessment & Plan Note (Signed)
Tolerating Fosamax well, planning on completing 5 years treatment. No changes in current management. Continue calcium and vitamin D supplementation. Fall prevention.

## 2019-01-29 NOTE — Assessment & Plan Note (Signed)
Continue losartan 50 mg daily. Low-salt diet and avoidance of oral NSAIDs. Adequate hydration and BP control.

## 2019-01-29 NOTE — Patient Instructions (Addendum)
  A few tips:  -As we age balance is not as good as it was, so there is a higher risks for falls. Please remove small rugs and furniture that is "in your way" and could increase the risk of falls. Stretching exercises may help with fall prevention: Yoga and Tai Chi are some examples. Low impact exercise is better, so you are not very achy the next day.  -Sun screen and avoidance of direct sun light recommended. Caution with dehydration, if working outdoors be sure to drink enough fluids.  - Some medications are not safe as we age, increases the risk of side effects and can potentially interact with other medication you are also taken;  including some of over the counter medications. Be sure to let me know when you start a new medication even if it is a dietary/vitamin supplement.   -Healthy diet low in red meet/animal fat and sugar + regular physical activity is recommended.    Screening schedule for the next 5-10 years:  Colonoscopy: We need to sign a release form.  Glaucoma screening/eye exam every 1-2 years.  Flu vaccine annually. Fall prevention  A few things to remember from today's visit:   Essential hypertension  Stage 3a chronic kidney disease  Vitamin D deficiency - Plan: VITAMIN D 25 Hydroxy (Vit-D Deficiency, Fractures)  Hyperlipidemia, unspecified hyperlipidemia type - Plan: Lipid panel  Aortic atherosclerosis ([place]), Chronic  Routine general medical examination at a health care facility  Osteoporosis, unspecified osteoporosis type, unspecified pathological fracture presence  Hyperthyroidism - Plan: T4, free, TSH, methimazole (TAPAZOLE) 5 MG tablet   Please be sure medication list is accurate. If a new problem present, please set up appointment sooner than planned today.

## 2019-01-31 ENCOUNTER — Encounter: Payer: Self-pay | Admitting: Physical Therapy

## 2019-01-31 ENCOUNTER — Ambulatory Visit: Payer: Medicare Other | Attending: Physical Medicine & Rehabilitation | Admitting: Physical Therapy

## 2019-01-31 ENCOUNTER — Other Ambulatory Visit: Payer: Self-pay

## 2019-01-31 DIAGNOSIS — R262 Difficulty in walking, not elsewhere classified: Secondary | ICD-10-CM | POA: Diagnosis not present

## 2019-01-31 DIAGNOSIS — M25561 Pain in right knee: Secondary | ICD-10-CM | POA: Diagnosis not present

## 2019-01-31 DIAGNOSIS — M25562 Pain in left knee: Secondary | ICD-10-CM | POA: Insufficient documentation

## 2019-01-31 DIAGNOSIS — G8929 Other chronic pain: Secondary | ICD-10-CM | POA: Insufficient documentation

## 2019-01-31 DIAGNOSIS — M6281 Muscle weakness (generalized): Secondary | ICD-10-CM

## 2019-01-31 NOTE — Therapy (Signed)
[place], [place], [address] Phone: [phone]   Fax:  [phone]  Physical Therapy Treatment  Patient Details  Name: Danielle [name] MRN: [id number] Date of Birth: Sep 17, 1945 Referring Provider (PT): [name], MD   Encounter Date: 01/31/2019  PT End of Session - 01/31/19 1015    Visit Number  7    Number of Visits  12    Date for PT Re-Evaluation  02/14/19    Authorization Type  [employer]HC Medicare    PT Start Time  1013    PT Stop Time  1055    PT Time Calculation (min)  42 min    Activity Tolerance  Patient limited by fatigue    Behavior During Therapy  [facility] for tasks assessed/performed       Past Medical History:  Diagnosis Date  . Allergy   . Anxiety   . Aortic stenosis    Status post bioprosthetic AVR  . Arthritis    DJD  . Asthma   . Breast cancer ([place]) 12/13/2016   Left breast  . Chronic systolic CHF (congestive heart failure) ([place])    EF 30% 04/2014  . COPD (chronic obstructive pulmonary disease) ([place])   . Coronary artery disease    per pt, had LHC prior to AVR in [place] that did not show any blockages; no stents/bypass  . Gastritis   . Hyperlipidemia   . Hypertension   . Hyperthyroidism   . Multiple thyroid nodules   . Osteoporosis   . Paroxysmal atrial fibrillation (HCC)   . Pelvis fracture ([place]) 08/19/2015   MULTIPLE   . Personal history of radiation therapy 2018  . Spontaneous pneumothorax 11/28/2015   left   . Stroke [facility]) 2000   rt hand weak    Past Surgical History:  Procedure Laterality Date  . BREAST LUMPECTOMY Left 12/2016  . BREAST LUMPECTOMY WITH RADIOACTIVE SEED AND SENTINEL LYMPH NODE BIOPSY Left 01/14/2017   Procedure: LEFT BREAST LUMPECTOMY WITH RADIOACTIVE SEED AND SENTINEL LYMPH NODE BIOPSY;  Surgeon: Rolm Bookbinder, MD;  Location: [place];  Service: General;  Laterality: Left;  . CHEST TUBE INSERTION  10/2015  . INTRAMEDULLARY  (IM) NAIL INTERTROCHANTERIC Left 12/10/2015   Procedure: INTRAMEDULLARY (IM) NAIL INTERTROCHANTRIC;  Surgeon: Leandrew Koyanagi, MD;  Location: [place];  Service: Orthopedics;  Laterality: Left;  . PLEURADESIS Left 12/03/2015   Procedure: PLEURADESIS;  Surgeon: Melrose Nakayama, MD;  Location: [place];  Service: Thoracic;  Laterality: Left;  . RESECTION OF APICAL BLEB Left 12/03/2015   Procedure: BLEBECTOMY;  Surgeon: Melrose Nakayama, MD;  Location: [place];  Service: Thoracic;  Laterality: Left;  . THORACOSCOPY  12/03/2015  . VALVE REPLACEMENT    . VIDEO ASSISTED THORACOSCOPY Left 12/03/2015   Procedure: VIDEO ASSISTED THORACOSCOPY;  Surgeon: Melrose Nakayama, MD;  Location: [place];  Service: Thoracic;  Laterality: Left;    There were no vitals filed for this visit.  Subjective Assessment - 01/31/19 1033    Subjective  Pt. reports has had some increased swelling in her left medial upper leg just distal to her knee which started this AM (noticed upon waking up this morning). No associated pain, just noting swelling. She reports had issues with this in the past but this was about 5 years ago and no associated explanation. She is scheduled for 1 more therapy visit next week-for now she wishes to keep appointment but wants to consider d/c to HEP and reports will  call later this week to cancel visit if needed. See pt. education and plan.    Pertinent History  chronic pain in pain management-chronic LBP with DDD, spondylosis, breast CA s/p lumpectomy and radiation, COPD, CHF, osteoporosis, a-fib, history CVA    Limitations  Standing;Walking;House hold activities    Diagnostic tests  X-rays    Patient Stated Goals  "Walk by myself"    Currently in Pain?  Yes    Pain Score  8     Pain Location  Knee    Pain Orientation  Left    Pain Descriptors / Indicators  Aching    Pain Type  Chronic pain    Pain Onset  More than a month ago    Pain Frequency  Intermittent    Aggravating Factors   standing and  walking    Pain Relieving Factors  rest, medication    Effect of Pain on Daily Activities  limits standing and walking tolerance         [facility] PT Assessment - 01/31/19 0001      Observation/Other Assessments   Focus on Therapeutic Outcomes (FOTO)   59% limited      AROM   Right Knee Extension  0    Right Knee Flexion  90    Left Knee Extension  0    Left Knee Flexion  90      Strength   Right Knee Extension  4+/5      Palpation   Palpation comment  no calf/lower leg tenderness to palpation noted      Transfers   Five time sit to stand comments   37 seconds                   [facility] Adult PT Treatment/Exercise - 01/31/19 0001      Knee/Hip Exercises: Aerobic   Nustep  L4 x 8 min UE/LE      Knee/Hip Exercises: Standing   Heel Raises  Both;20 reps    Hip Abduction  AROM;Stengthening;Both;15 reps    Forward Step Up  Both;10 reps;Hand Hold: 2;Step Height: 2"    Functional Squat Limitations  partial squat at counter x 20 reps, chair behind patient for cueing for form and safety      Knee/Hip Exercises: Seated   Long Arc Quad  AROM;Strengthening;Both;2 sets;10 reps    Long Arc Quad Weight  5 lbs.    Hamstring Curl  AROM;Strengthening;Both;2 sets;10 reps    Hamstring Limitations  Blue Theraband    Sit to [employer]  5 reps   for 5Xsit to stand test     Knee/Hip Exercises: Supine   Short Arc Quad Sets  Both;2 sets;10 reps    Short Arc Quad Sets Limitations  3 lbs.    Hip Adduction Isometric  Strengthening;15 reps    Hip Adduction Isometric Limitations  ball squeeze in hooklying x 5 sec holds    Bridges  2 sets;10 reps    Bridges Limitations  glut set to initiation lift off (unable to clear mat)    Other Supine Knee/Hip Exercises  clamshell blue band x 20 reps             PT Education - 01/31/19 1050    Education Details  alert MD if LLE swelling persists or if developing associated calf/lower leg pain and tenderness, HEP, POC    Person(s) Educated  Patient     Methods  Explanation    Comprehension  Verbalized understanding  PT Short Term Goals - 01/31/19 1040      PT SHORT TERM GOAL #1   Title  Independent with HEP    Baseline  met    Time  3    Period  Weeks    Status  Achieved    Target Date  01/24/19      PT SHORT TERM GOAL #2   Title  Increase bilat. knee flexion AROM at least 5-10 deg to improve ability for sit<>stand from lower chairs and for step/stair navigation    Baseline  90 deg bilat.    Time  3    Period  Weeks    Status  On-going        PT Long Term Goals - 01/31/19 1022      PT LONG TERM GOAL #1   Title  Increase right knee extension strength to 5/5 to improve ability for sit>stand from low chairs and step/stair navigation    Baseline  4+/5    Time  6    Period  Weeks    Status  On-going      PT LONG TERM GOAL #2   Title  Pt. to improve TUG and 5x sit<>stand times at least 10 seconds ea. to work towards decreased fall risk and improved functional strength for transfers    Baseline  TUG 25 sec (34 sec at eval), 5x sit<>stand 37 sec    Time  6    Period  Weeks    Status  On-going      PT LONG TERM GOAL #3   Title  Improve FOTO outcome measure score to 47% or less impairment    Baseline  59% limited    Time  6    Period  Weeks      PT LONG TERM GOAL #4   Title  Ambulate 100 feet or greater CGA-SBA with SPC for progression to ability for gait with LRAD    Baseline  25 feet min assist with NBQC during PT at last trial gait, uses RW for home and community mobility    Time  6    Period  Weeks    Status  On-going            Plan - 01/31/19 1059    Clinical Impression Statement  Mild objective improvement from baseline status with gait speed for TUG and mild improvement (4%) with FOTO score from baseline but pt. continues with LE weakness, gait difficulty and limited activity tolerance due to pain and fatigue. Expect management of this and progress will take time given symptom duration and  comorbidities. Regarding leg swelling no associated tenderness or pain but recommend alert MD of this persists and/or if associated pain develops for further assessment/to rule out DVT. Message was sent to Dr. Letta Pate also to alert regarding leg swelling symptoms.    Personal Factors and Comorbidities  Age;Comorbidity 3+;Time since onset of injury/illness/exacerbation    Comorbidities  chronic pain history, LBP, recent breast CA, COPD, CHF, weakness after left hip fracture s/p IM nail, obesity    Examination-Activity Limitations  Stairs;Stand;Transfers;Squat;Lift;Locomotion Level    Examination-Participation Restrictions  Community Activity;Meal Prep;Shop;Yard Work;Laundry;Cleaning    Stability/Clinical Decision Making  Evolving/Moderate complexity    Clinical Decision Making  Moderate    Rehab Potential  Fair    PT Frequency  2x / week    PT Duration  6 weeks    PT Treatment/Interventions  ADLs/Self Care Home Management;Cryotherapy;Moist Heat;Stair training;Balance training;Functional mobility training;Therapeutic activities;Therapeutic exercise;Neuromuscular  re-education;Patient/family education;Manual techniques;Passive range of motion;Vasopneumatic Device;Taping;Gait training    PT Next Visit Plan  POC through 02/14/19-continue per POC vs. d/c to HEP pending pt. preferences    PT Home Exercise Plan  sit<>stand - hands on thighs, heel raises, LAQ, quad sets, glut sets, LAQ with Theraband, seated clamshell with Theraband, partial bridge    Consulted and Agree with Plan of Care  Patient       Patient will benefit from skilled therapeutic intervention in order to improve the following deficits and impairments:  Pain, Difficulty walking, Decreased balance, Abnormal gait, Decreased range of motion, Decreased strength, Decreased activity tolerance, Impaired flexibility  Visit Diagnosis: Chronic pain of right knee  Chronic pain of left knee  Muscle weakness (generalized)  Difficulty in  walking, not elsewhere classified     Problem List Patient Active Problem List   Diagnosis Date Noted  . Bacteremia due to Gram-positive bacteria 08/29/2018  . Abscess   . Cellulitis of left breast 08/28/2018  . Cellulitis of left abdominal wall 08/28/2018  . Encounter for routine gynecological examination 04/28/2018  . CKD (chronic kidney disease), stage III 09/27/2017  . Recurrent genital herpes 04/13/2017  . Chronic pain disorder 01/25/2017  . Malignant neoplasm of upper-outer quadrant of left breast in female, estrogen receptor positive ([place]) 12/16/2016  . Chronic knee pain 12/07/2016  . Chronic bilateral low back pain without sciatica 12/07/2016  . Peripheral musculoskeletal gait disorder 12/07/2016  . Encounter for therapeutic drug monitoring 06/16/2016  . Generalized osteoarthritis of multiple sites 05/04/2016  . Chronic anticoagulation 05/04/2016  . Stroke ([place]) 04/14/2016  . Aortic valve disease 04/10/2016  . History of aortic valve replacement 04/10/2016  . NICM (nonischemic cardiomyopathy) ([place]) 04/10/2016  . Upper airway cough syndrome 03/14/2016  . Morbid obesity due to excess calories ([place]) 03/14/2016  . COPD GOLD 0  03/11/2016  . Thrombocytopenia ([place]) 12/12/2015  . Anemia 12/12/2015  . Vitamin D deficiency 12/12/2015  . Acute urinary retention 12/11/2015  . Osteoporosis 12/11/2015  . Hip fracture ([place]) 12/10/2015  . Aortic atherosclerosis ([place]) 12/10/2015  . Lung blebs ([place]) 12/03/2015  . Pelvic fracture ([place]) 08/19/2015  . Fall at home 08/19/2015  . Essential hypertension 08/19/2015  . Hyperlipidemia 08/19/2015  . Asthma 08/19/2015  . Paroxysmal atrial fibrillation ([place]) 08/19/2015  . Hyperthyroidism 08/19/2015  . Chronic systolic (congestive) heart failure ([place])   . 3-vessel CAD   . Aortic stenosis     Beaulah Dinning, PT, DPT 01/31/19 11:08 AM  [facility] [address] [place], [place],  [address] Phone: [phone]   Fax:  [phone]  Name: Danielle [name] MRN: [id number] Date of Birth: Sep 24, 1945

## 2019-02-01 ENCOUNTER — Other Ambulatory Visit: Payer: Self-pay | Admitting: Hematology

## 2019-02-02 ENCOUNTER — Ambulatory Visit: Payer: Medicare Other | Admitting: Physical Therapy

## 2019-02-02 ENCOUNTER — Ambulatory Visit: Payer: Medicare Other | Admitting: Internal Medicine

## 2019-02-05 ENCOUNTER — Ambulatory Visit: Payer: Medicare Other | Admitting: Physical Therapy

## 2019-02-09 ENCOUNTER — Other Ambulatory Visit: Payer: Self-pay

## 2019-02-12 ENCOUNTER — Ambulatory Visit (INDEPENDENT_AMBULATORY_CARE_PROVIDER_SITE_OTHER): Payer: Medicare Other | Admitting: General Practice

## 2019-02-12 ENCOUNTER — Other Ambulatory Visit: Payer: Self-pay

## 2019-02-12 DIAGNOSIS — Z7901 Long term (current) use of anticoagulants: Secondary | ICD-10-CM | POA: Diagnosis not present

## 2019-02-12 LAB — POCT INR: INR: 2.4 (ref 2.0–3.0)

## 2019-02-12 NOTE — Patient Instructions (Signed)
Pre visit review using our clinic review tool, if applicable. No additional management support is needed unless otherwise documented below in the visit note.  Continue 3 mg daily except 1.5 mg on Wed.  Re-check in 4 weeks.

## 2019-02-16 ENCOUNTER — Telehealth: Payer: Self-pay | Admitting: Family Medicine

## 2019-02-16 NOTE — Telephone Encounter (Signed)
ROI fax to [facility] for patient records

## 2019-02-20 NOTE — Telephone Encounter (Signed)
[place] forwarded 84 pages to [name] Betty MD

## 2019-02-26 NOTE — Therapy (Signed)
[place], [place], [address] Phone: [phone]   Fax:  [phone]  Physical Therapy Treatment/Discharge  Patient Details  Name: Danielle [name] MRN: [id number] Date of Birth: 1945-07-15 Referring Provider (PT): [name], MD   Encounter Date: 01/31/2019    Past Medical History:  Diagnosis Date  . Allergy   . Anxiety   . Aortic stenosis    Status post bioprosthetic AVR  . Arthritis    DJD  . Asthma   . Breast cancer ([place]) 12/13/2016   Left breast  . Chronic systolic CHF (congestive heart failure) ([place])    EF 30% 04/2014  . COPD (chronic obstructive pulmonary disease) ([place])   . Coronary artery disease    per pt, had LHC prior to AVR in [place] that did not show any blockages; no stents/bypass  . Gastritis   . Hyperlipidemia   . Hypertension   . Hyperthyroidism   . Multiple thyroid nodules   . Osteoporosis   . Paroxysmal atrial fibrillation (HCC)   . Pelvis fracture ([place]) 08/19/2015   MULTIPLE   . Personal history of radiation therapy 2018  . Spontaneous pneumothorax 11/28/2015   left   . Stroke [facility]) 2000   rt hand weak    Past Surgical History:  Procedure Laterality Date  . BREAST LUMPECTOMY Left 12/2016  . BREAST LUMPECTOMY WITH RADIOACTIVE SEED AND SENTINEL LYMPH NODE BIOPSY Left 01/14/2017   Procedure: LEFT BREAST LUMPECTOMY WITH RADIOACTIVE SEED AND SENTINEL LYMPH NODE BIOPSY;  Surgeon: Rolm Bookbinder, MD;  Location: [place];  Service: General;  Laterality: Left;  . CHEST TUBE INSERTION  10/2015  . INTRAMEDULLARY (IM) NAIL INTERTROCHANTERIC Left 12/10/2015   Procedure: INTRAMEDULLARY (IM) NAIL INTERTROCHANTRIC;  Surgeon: Leandrew Koyanagi, MD;  Location: [place];  Service: Orthopedics;  Laterality: Left;  . PLEURADESIS Left 12/03/2015   Procedure: PLEURADESIS;  Surgeon: Melrose Nakayama, MD;  Location: [place];  Service: Thoracic;  Laterality: Left;  .  RESECTION OF APICAL BLEB Left 12/03/2015   Procedure: BLEBECTOMY;  Surgeon: Melrose Nakayama, MD;  Location: [place];  Service: Thoracic;  Laterality: Left;  . THORACOSCOPY  12/03/2015  . VALVE REPLACEMENT    . VIDEO ASSISTED THORACOSCOPY Left 12/03/2015   Procedure: VIDEO ASSISTED THORACOSCOPY;  Surgeon: Melrose Nakayama, MD;  Location: [place];  Service: Thoracic;  Laterality: Left;    There were no vitals filed for this visit.                              PT Short Term Goals - 01/31/19 1040      PT SHORT TERM GOAL #1   Title  Independent with HEP    Baseline  met    Time  3    Period  Weeks    Status  Achieved    Target Date  01/24/19      PT SHORT TERM GOAL #2   Title  Increase bilat. knee flexion AROM at least 5-10 deg to improve ability for sit<>stand from lower chairs and for step/stair navigation    Baseline  90 deg bilat.    Time  3    Period  Weeks    Status  On-going        PT Long Term Goals - 01/31/19 1022      PT LONG TERM GOAL #1   Title  Increase right knee extension strength to 5/5 to  improve ability for sit>stand from low chairs and step/stair navigation    Baseline  4+/5    Time  6    Period  Weeks    Status  On-going      PT LONG TERM GOAL #2   Title  Pt. to improve TUG and 5x sit<>stand times at least 10 seconds ea. to work towards decreased fall risk and improved functional strength for transfers    Baseline  TUG 25 sec (34 sec at eval), 5x sit<>stand 37 sec    Time  6    Period  Weeks    Status  On-going      PT LONG TERM GOAL #3   Title  Improve FOTO outcome measure score to 47% or less impairment    Baseline  59% limited    Time  6    Period  Weeks      PT LONG TERM GOAL #4   Title  Ambulate 100 feet or greater CGA-SBA with SPC for progression to ability for gait with LRAD    Baseline  25 feet min assist with NBQC during PT at last trial gait, uses RW for home and community mobility    Time  6    Period   Weeks    Status  On-going              Patient will benefit from skilled therapeutic intervention in order to improve the following deficits and impairments:  Pain, Difficulty walking, Decreased balance, Abnormal gait, Decreased range of motion, Decreased strength, Decreased activity tolerance, Impaired flexibility  Visit Diagnosis: Chronic pain of right knee  Chronic pain of left knee  Muscle weakness (generalized)  Difficulty in walking, not elsewhere classified     Problem List Patient Active Problem List   Diagnosis Date Noted  . Bacteremia due to Gram-positive bacteria 08/29/2018  . Abscess   . Cellulitis of left breast 08/28/2018  . Cellulitis of left abdominal wall 08/28/2018  . Encounter for routine gynecological examination 04/28/2018  . CKD (chronic kidney disease), stage III 09/27/2017  . Recurrent genital herpes 04/13/2017  . Chronic pain disorder 01/25/2017  . Malignant neoplasm of upper-outer quadrant of left breast in female, estrogen receptor positive ([place]) 12/16/2016  . Chronic knee pain 12/07/2016  . Chronic bilateral low back pain without sciatica 12/07/2016  . Peripheral musculoskeletal gait disorder 12/07/2016  . Encounter for therapeutic drug monitoring 06/16/2016  . Generalized osteoarthritis of multiple sites 05/04/2016  . Chronic anticoagulation 05/04/2016  . Stroke ([place]) 04/14/2016  . Aortic valve disease 04/10/2016  . History of aortic valve replacement 04/10/2016  . NICM (nonischemic cardiomyopathy) ([place]) 04/10/2016  . Upper airway cough syndrome 03/14/2016  . Morbid obesity due to excess calories ([place]) 03/14/2016  . COPD GOLD 0  03/11/2016  . Thrombocytopenia ([place]) 12/12/2015  . Anemia 12/12/2015  . Vitamin D deficiency 12/12/2015  . Acute urinary retention 12/11/2015  . Osteoporosis 12/11/2015  . Hip fracture ([place]) 12/10/2015  . Aortic atherosclerosis ([place]) 12/10/2015  . Lung blebs ([place]) 12/03/2015  . Pelvic fracture ([place])  08/19/2015  . Fall at home 08/19/2015  . Essential hypertension 08/19/2015  . Hyperlipidemia 08/19/2015  . Asthma 08/19/2015  . Paroxysmal atrial fibrillation ([place]) 08/19/2015  . Hyperthyroidism 08/19/2015  . Chronic systolic (congestive) heart failure ([place])   . 3-vessel CAD   . Aortic stenosis        PHYSICAL THERAPY DISCHARGE SUMMARY  Visits from Start of Care: 7  Current functional level related to  goals / functional outcomes: Patient cancelled last scheduled therapy visit and plans on continuing via HEP   Remaining deficits: Muscle weakness, decreased activity tolerance, gait/balance impairment   Education / Equipment: HEP Plan: Patient agrees to discharge.  Patient goals were not met. Patient is being discharged due to the patient's request.  ?????           Beaulah Dinning, PT, DPT 02/26/19 10:17 AM     [facility] [address] [place], [place], [address] Phone: [phone]   Fax:  [phone]  Name: Danielle [name] MRN: [id number] Date of Birth: 12/06/1945

## 2019-02-28 ENCOUNTER — Other Ambulatory Visit: Payer: Self-pay | Admitting: Family Medicine

## 2019-03-10 ENCOUNTER — Other Ambulatory Visit: Payer: Self-pay | Admitting: Family Medicine

## 2019-03-10 ENCOUNTER — Other Ambulatory Visit: Payer: Self-pay | Admitting: Internal Medicine

## 2019-03-10 DIAGNOSIS — I480 Paroxysmal atrial fibrillation: Secondary | ICD-10-CM

## 2019-03-12 ENCOUNTER — Ambulatory Visit: Payer: Medicare Other | Admitting: Internal Medicine

## 2019-03-12 ENCOUNTER — Encounter: Payer: Self-pay | Admitting: Internal Medicine

## 2019-03-12 ENCOUNTER — Other Ambulatory Visit: Payer: Self-pay

## 2019-03-12 VITALS — BP 160/94 | HR 81 | Ht 67.0 in | Wt 258.5 lb

## 2019-03-12 DIAGNOSIS — I5022 Chronic systolic (congestive) heart failure: Secondary | ICD-10-CM | POA: Diagnosis not present

## 2019-03-12 DIAGNOSIS — I480 Paroxysmal atrial fibrillation: Secondary | ICD-10-CM

## 2019-03-12 DIAGNOSIS — I10 Essential (primary) hypertension: Secondary | ICD-10-CM

## 2019-03-12 DIAGNOSIS — Z952 Presence of prosthetic heart valve: Secondary | ICD-10-CM

## 2019-03-12 DIAGNOSIS — Z79899 Other long term (current) drug therapy: Secondary | ICD-10-CM

## 2019-03-12 DIAGNOSIS — I428 Other cardiomyopathies: Secondary | ICD-10-CM | POA: Diagnosis not present

## 2019-03-12 MED ORDER — ENTRESTO 49-51 MG PO TABS: 1.0000 | ORAL_TABLET | Freq: Two times a day (BID) | ORAL | 3 refills | Status: DC

## 2019-03-12 NOTE — Patient Instructions (Signed)
Medication Instructions:  Your physician has recommended you make the following change in your medication:  1- STOP Losartan. 2- START Entresto 49/51 mg by mouth two times a day.  *If you need a refill on your cardiac medications before your next appointment, please call your pharmacy*  Lab Work: Your physician recommends that you return for lab work in: [place], around January 26th for BMET.  At [facility]. You will need to call [phone] to schedule date and time which is best for you.  If you have labs (blood work) drawn today and your tests are completely normal, you will receive your results only by: Marland Kitchen MyChart Message (if you have MyChart) OR . A paper copy in the mail If you have any lab test that is abnormal or we need to change your treatment, we will call you to review the results.  Testing/Procedures: none  Follow-Up: At [facility], you and your health needs are our priority.  As part of our continuing mission to provide you with exceptional heart care, we have created designated Provider Care Teams.  These Care Teams include your primary Cardiologist (physician) and Advanced Practice Providers (APPs -  Physician Assistants and Nurse Practitioners) who all work together to provide you with the care you need, when you need it.  Your next appointment:   3 month(s)  The format for your next appointment:   In Person  Provider:    You may see [name], MD or one of the following Advanced Practice Providers on your designated Care Team:    Murray Hodgkins, NP  Christell Faith, PA-C  Marrianne Mood, PA-C

## 2019-03-12 NOTE — Progress Notes (Signed)
Follow-up Outpatient Visit Date: 03/12/2019  Primary Care Provider: [name], [name], MD [address] [place] [address]  Chief Complaint: Follow-up heart failure  HPI:  Danielle [name] is a 74 y.o. female with history of  aortic valve disease (severe regurgitation by her description) s/p bioprosthetic aortic valve replacement in [place] in 03/2014, NICM with LVEF as low as 30-35% by report in 03/2015, paroxysmal atrial fibrillation on chronic warfarin, stroke x 2, hyperlipidemia, hypertension, hyperthyroidism,left breast cancer,spontaneous pneumothorax, and left hip fracture, who presents for follow-up of valvular heart disease atrial fibrillation, and cardiomyopathy.  We last spoke via virtual visit in April, at which time [name] was feeling well from a heart standpoint.  She was still somewhat weak and off balance.  No medication changes were made at that time.  Follow-up echo in 12/2018 showed stable moderate reduction in LVEF with normal bioprosthetic aortic valve function.  Today, [name] reports feeling about the same as well we last spoke.  She has stable exertional dyspnea and 2-3 pillow orthopnea.  Her balance remains off, forcing her to use a walker most of the time.  She is doing some physical therapy on her own.  She has not had any further falls.  She denies chest pain and palpitations.  Occasional ankle edema is stable.  --------------------------------------------------------------------------------------------------  Cardiovascular History & Procedures: Cardiovascular Problems:  Aortic valve disease status post bioprosthetic AVR in 03/2014  Non-ischemic cardiomyopathy  Paroxysmal atrial fibrillation  Stroke  Risk Factors:  Hypertension, hyperlipidemia, stroke, and age > 42  Cath/PCI:  R/LHC (02/12/2014, [place]): Moderate proximal LAD calcification with mild, nonobstructive disease.  Mildly reduced LVEF with severe  apical hypokinesis.  Normal pulmonary artery pressure.  CV Surgery:  Bioprosthetic aortic valve replacement (03/12/14, [place], [place])  EP Procedures and Devices:  None  Non-Invasive Evaluation(s):  TTE (01/18/2019): Normal LV size with LVEF 35-40%.  Mild LVH.  Grade 1 diastolic dysfunction.  Is in function.  Bioprosthetic aortic valve with normal function.  Normal PA pressure.  TTE (04/21/16): Normal obese size with moderate LVH. LVEF 35-40% with mid and apical anteroseptal hypokinesis. Grade 3 diastolic dysfunction noted. Aortic valve bioprosthesis present with a mean gradient of 11 mmHg. Mitral annular calcification noted. Normal RV size and function. Mild right atrial enlargement.  TTE (03/27/15, OSH): Mild LVH with LVEF 30-35%, mild left atrial enlargement, AVR in place without regurgitation.  TTE (05/10/14, OSH): LVEF 45-50%.  Recent CV Pertinent Labs: Lab Results  Component Value Date   CHOL 161 01/29/2019   HDL 64.90 01/29/2019   LDLCALC 81 01/29/2019   TRIG 77.0 01/29/2019   CHOLHDL 2 01/29/2019   INR 2.4 02/12/2019   INR 2.3 (H) 09/01/2018   K 4.5 11/20/2018   K 4.3 12/22/2016   MG 2.1 08/28/2018   BUN 14 11/20/2018   BUN 19 04/28/2017   BUN 19.5 12/22/2016   CREATININE 1.20 (H) 11/20/2018   CREATININE 1.2 (H) 12/22/2016    Past medical and surgical history were reviewed and updated in EPIC.  Current Meds  Medication Sig  . acetaminophen (TYLENOL) 500 MG tablet Take 1,000 mg by mouth daily as needed (PAIN).  Marland Kitchen albuterol (PROVENTIL HFA;VENTOLIN HFA) 108 (90 Base) MCG/ACT inhaler Inhale 2 puffs into the lungs every 6 (six) hours as needed for wheezing or shortness of breath.   Marland Kitchen alendronate (FOSAMAX) 70 MG tablet Take 1 tablet (70 mg total) by mouth once a week. Take with a full glass of water on  an empty stomach.  Marland Kitchen aspirin EC 81 MG tablet Take 81 mg daily by mouth.  . budesonide-formoterol (SYMBICORT) 160-4.5 MCG/ACT inhaler Inhale 2 puffs into  the lungs 2 (two) times daily.  . carvedilol (COREG) 25 MG tablet Take 1 tablet by mouth twice daily  . Cholecalciferol (VITAMIN D3) 400 units tablet Take 1 tablet (400 Units total) by mouth daily.  . diclofenac sodium (VOLTAREN) 1 % GEL Apply 4 g topically 4 (four) times daily.  . diphenhydramine-acetaminophen (TYLENOL PM) 25-500 MG TABS tablet Take 2 tablets by mouth at bedtime.   . furosemide (LASIX) 20 MG tablet Take 1 tablet (20 mg total) by mouth daily.  Marland Kitchen losartan (COZAAR) 50 MG tablet Take 1 tablet by mouth once daily  . methimazole (TAPAZOLE) 5 MG tablet TAKE 3 TABLETS BY MOUTH EVERY EVENING. FOLLOW UP APPT IS NEEDED BEFORE MORE REFILLS ARE AUTHORIZED.  Marland Kitchen rosuvastatin (CRESTOR) 40 MG tablet Take 1 tablet by mouth once daily  . senna-docusate (SENOKOT-S) 8.6-50 MG tablet Take 2 tablets at bedtime by mouth.   . tamoxifen (NOLVADEX) 20 MG tablet Take 1 tablet by mouth once daily  . traMADol (ULTRAM) 50 MG tablet Take 1 tablet (50 mg total) by mouth 3 (three) times daily.  . valACYclovir (VALTREX) 500 MG tablet Take 1 tablet (500 mg total) by mouth 2 (two) times daily. For 3 days when outbreaks. (Patient taking differently: Take 500 mg by mouth 2 (two) times daily as needed (outbreaks). For 3 days when outbreaks.)  . warfarin (COUMADIN) 1 MG tablet 1 tab wednesdays (Patient taking differently: Take 1 mg by mouth once a week. Take 1 tablet (1 mg) Only on Wednesday)  . warfarin (COUMADIN) 3 MG tablet TAKE AS DIRECTED BY ANTICOAGULATION CLINIC.    Allergies: Lisinopril and Tetanus toxoid adsorbed  Social History   Tobacco Use  . Smoking status: Former Smoker    Packs/day: 0.25    Years: 10.00    Pack years: 2.50    Types: Cigarettes    Quit date: 03/01/2010    Years since quitting: 9.0  . Smokeless tobacco: Never Used  Substance Use Topics  . Alcohol use: No  . Drug use: No    Family History  Problem Relation Age of Onset  . Diabetes Mother   . Heart attack Mother 60  .  Diabetes Father   . Lung cancer Father   . Diabetes Sister   . Thyroid disease Sister   . Diabetes Sister   . HIV Brother     Review of Systems: A 12-system review of systems was performed and was negative except as noted in the HPI.  --------------------------------------------------------------------------------------------------  Physical Exam: BP (!) 160/94 (BP Location: Right Arm, Patient Position: Sitting, Cuff Size: Normal)   Pulse 81   Ht 5\' 7"  (1.702 m)   Wt 200 lb (90.7 kg)   SpO2 98%   BMI 31.32 kg/m   General: NAD. HEENT: No conjunctival pallor or scleral icterus. Facemask in place. Neck: Supple without lymphadenopathy, thyromegaly, JVD, or HJR. Lungs: Normal work of breathing. Clear to auscultation bilaterally without wheezes or crackles. Heart: Regular rate and rhythm without murmurs, rubs, or gallops. Non-displaced PMI. Abd: Bowel sounds present. Soft, NT/ND without hepatosplenomegaly Ext: No lower extremity edema. Skin: Warm and dry without rash.  EKG: Normal sinus rhythm with occasional PVCs, borderline LVH, and inferior and anterolateral Q waves.  No.  Otherwise, no significant change from 10/13/2017.  Lab Results  Component Value Date   WBC 4.5  11/20/2018   HGB 12.3 11/20/2018   HCT 38.1 11/20/2018   MCV 94.8 11/20/2018   PLT 84 (L) 11/20/2018    Lab Results  Component Value Date   NA 142 11/20/2018   K 4.5 11/20/2018   CL 111 11/20/2018   CO2 24 11/20/2018   BUN 14 11/20/2018   CREATININE 1.20 (H) 11/20/2018   GLUCOSE 100 (H) 11/20/2018   ALT 10 11/20/2018    Lab Results  Component Value Date   CHOL 161 01/29/2019   HDL 64.90 01/29/2019   LDLCALC 81 01/29/2019   TRIG 77.0 01/29/2019   CHOLHDL 2 01/29/2019    --------------------------------------------------------------------------------------------------  ASSESSMENT AND PLAN: Chronic systolic 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 Mclauchlin appears euvolemic  on exam with relatively stable weight.  She continues to endorse NYHA class II-III symptoms, though her mobility is largely limited by gait instability.  We discussed options for further improvement in her heart failure and have agreed to switch losartan to Entresto 49-51 twice daily.  We will have her get a basic metabolic panel in about 2 weeks at the [facility] office.  No other medication changes at this time.  Defer adding spironolactone given history of borderline hyperkalemia in the past.  Aortic valve replacement: No clinical or echo evidence of valve dysfunction.  Continue clinical follow-up.  Paroxysmal atrial fibrillation: Ms. Sol is in sinus rhythm today and without symptoms to suggest significant recurrence of atrial fibrillation.  Continue indefinite anticoagulation with warfarin as well as current dose of carvedilol.  Hypertension: Blood pressure suboptimally controlled today.  Hopefully, transition from losartan to [facility] will improve her blood pressure control.  Sodium restriction encouraged.  Morbid obesity: BMI greater than 40 with multiple comorbidities.  I encouraged weight loss through diet and exercise, though limited mobility will complicate this.  Hopefully, Ms. Stuedemann can continue to do some physical therapy on her own.  Follow-up: Return to clinic in 3 months.  [name], MD 03/12/2019 11:00 AM

## 2019-03-15 ENCOUNTER — Telehealth: Payer: Self-pay | Admitting: Internal Medicine

## 2019-03-15 NOTE — Telephone Encounter (Signed)
Called patient. In discussing her medications, she said she had stopped her carvedilol. I clarified with her that she was to stop her losartan, start Entresto and still continue other medications. Patient was very apologetic. She said she will resume her carvedilol and other medications as prescribed with the discontinuation of losartan and addition of Entresto. She will continue to monitor BP and let us know in a few days if it remains elevated.

## 2019-03-15 NOTE — Telephone Encounter (Signed)
Pt c/o medication issue:  1. Name of Medication: Entresto   2. How are you currently taking this medication (dosage and times per day)? 49-51 MG 1 tablet 2 times daily   3. Are you having a reaction (difficulty breathing--STAT)? BP is still high    4. What is your medication issue? Patient just started Entresto medication but BP readings are still high.  01/13 - 144/96 01/14 - 147/97

## 2019-03-20 ENCOUNTER — Other Ambulatory Visit: Payer: Self-pay

## 2019-03-20 DIAGNOSIS — I5022 Chronic systolic (congestive) heart failure: Secondary | ICD-10-CM | POA: Diagnosis not present

## 2019-03-20 DIAGNOSIS — Z79899 Other long term (current) drug therapy: Secondary | ICD-10-CM | POA: Diagnosis not present

## 2019-03-21 ENCOUNTER — Telehealth: Payer: Self-pay | Admitting: *Deleted

## 2019-03-21 DIAGNOSIS — Z79899 Other long term (current) drug therapy: Secondary | ICD-10-CM

## 2019-03-21 DIAGNOSIS — I5022 Chronic systolic (congestive) heart failure: Secondary | ICD-10-CM

## 2019-03-21 LAB — BASIC METABOLIC PANEL
BUN/Creatinine Ratio: 13 (ref 12–28)
BUN: 15 mg/dL (ref 8–27)
CO2: 23 mmol/L (ref 20–29)
Calcium: 10.2 mg/dL (ref 8.7–10.3)
Chloride: 108 mmol/L — ABNORMAL HIGH (ref 96–106)
Creatinine, Ser: 1.19 mg/dL — ABNORMAL HIGH (ref 0.57–1.00)
GFR calc Af Amer: 52 mL/min/{1.73_m2} — ABNORMAL LOW (ref >59)
GFR calc non Af Amer: 45 mL/min/{1.73_m2} — ABNORMAL LOW (ref >59)
Glucose: 98 mg/dL (ref 65–99)
Potassium: 5.5 mmol/L — ABNORMAL HIGH (ref 3.5–5.2)
Sodium: 143 mmol/L (ref 134–144)

## 2019-03-21 MED ORDER — ENTRESTO 49-51 MG PO TABS: 0.5000 | ORAL_TABLET | Freq: Two times a day (BID) | ORAL | 3 refills | Status: DC

## 2019-03-21 NOTE — Telephone Encounter (Signed)
-----   Message from Danielle Bush, MD sent at 03/21/2019  8:31 AM EST ----- Please let the patient know that her kidney function is stable but her potassium has risen and is now mildly elevated.  I suggest that we cut her Entresto in half (she can split her current tablets) with repeat BMP (in [place]) in approximately 2 weeks.  She should try to minimize her dietary potassium intake.

## 2019-03-21 NOTE — Telephone Encounter (Signed)
Results called to pt. Pt verbalized understanding of results and recommendations.  She will cut her Entresto in half and take half two times a day. She will go to [facility] for lab work on 04/04/19 as she did yesterday at the [facility] [facility].  Med list updated.

## 2019-03-23 ENCOUNTER — Other Ambulatory Visit: Payer: Self-pay

## 2019-03-24 ENCOUNTER — Ambulatory Visit: Payer: Medicare Other | Attending: Internal Medicine

## 2019-03-26 ENCOUNTER — Ambulatory Visit (INDEPENDENT_AMBULATORY_CARE_PROVIDER_SITE_OTHER): Payer: Medicare Other | Admitting: General Practice

## 2019-03-26 DIAGNOSIS — Z7901 Long term (current) use of anticoagulants: Secondary | ICD-10-CM | POA: Diagnosis not present

## 2019-03-26 LAB — POCT INR: INR: 2.5 (ref 2.0–3.0)

## 2019-03-26 NOTE — Patient Instructions (Addendum)
Pre visit review using our clinic review tool, if applicable. No additional management support is needed unless otherwise documented below in the visit note.  Continue 3 mg daily except 1.5 mg on Wed.  Re-check in 4 weeks.

## 2019-03-28 ENCOUNTER — Other Ambulatory Visit: Payer: Self-pay | Admitting: Hematology

## 2019-04-02 ENCOUNTER — Telehealth: Payer: Self-pay | Admitting: Family Medicine

## 2019-04-02 NOTE — Telephone Encounter (Signed)
Patient needs Rx for a walker with a seat on it.  Sent to address in chart I verified address is correct.  Please advise

## 2019-04-03 NOTE — Telephone Encounter (Signed)
Rx placed in the mail to be mailed out to pt.

## 2019-04-03 NOTE — Telephone Encounter (Signed)
Rx written & placed in provider's folder on desk to be signed. Will mail once Rx signed.

## 2019-04-10 ENCOUNTER — Other Ambulatory Visit: Payer: Medicare Other

## 2019-04-10 ENCOUNTER — Other Ambulatory Visit: Payer: Self-pay

## 2019-04-10 DIAGNOSIS — I5022 Chronic systolic (congestive) heart failure: Secondary | ICD-10-CM

## 2019-04-10 DIAGNOSIS — Z79899 Other long term (current) drug therapy: Secondary | ICD-10-CM

## 2019-04-10 LAB — BASIC METABOLIC PANEL
BUN/Creatinine Ratio: 13 (ref 12–28)
BUN: 14 mg/dL (ref 8–27)
CO2: 20 mmol/L (ref 20–29)
Calcium: 9.7 mg/dL (ref 8.7–10.3)
Chloride: 107 mmol/L — ABNORMAL HIGH (ref 96–106)
Creatinine, Ser: 1.09 mg/dL — ABNORMAL HIGH (ref 0.57–1.00)
GFR calc Af Amer: 58 mL/min/{1.73_m2} — ABNORMAL LOW (ref >59)
GFR calc non Af Amer: 50 mL/min/{1.73_m2} — ABNORMAL LOW (ref >59)
Glucose: 98 mg/dL (ref 65–99)
Potassium: 4.9 mmol/L (ref 3.5–5.2)
Sodium: 141 mmol/L (ref 134–144)

## 2019-04-11 ENCOUNTER — Other Ambulatory Visit: Payer: Self-pay

## 2019-04-11 MED ORDER — ENTRESTO 49-51 MG PO TABS: 0.5000 | ORAL_TABLET | Freq: Two times a day (BID) | ORAL | 0 refills | Status: DC

## 2019-04-20 ENCOUNTER — Other Ambulatory Visit: Payer: Self-pay

## 2019-04-21 ENCOUNTER — Ambulatory Visit: Payer: Medicare Other

## 2019-04-23 ENCOUNTER — Ambulatory Visit (INDEPENDENT_AMBULATORY_CARE_PROVIDER_SITE_OTHER): Payer: Medicare Other | Admitting: General Practice

## 2019-04-23 ENCOUNTER — Other Ambulatory Visit: Payer: Self-pay

## 2019-04-23 DIAGNOSIS — Z7901 Long term (current) use of anticoagulants: Secondary | ICD-10-CM | POA: Diagnosis not present

## 2019-04-23 LAB — POCT INR: INR: 2.8 (ref 2.0–3.0)

## 2019-04-23 NOTE — Patient Instructions (Signed)
Pre visit review using our clinic review tool, if applicable. No additional management support is needed unless otherwise documented below in the visit note.  Continue 3 mg daily except 1.5 mg on Wed.  Re-check in 6 weeks.

## 2019-04-28 ENCOUNTER — Ambulatory Visit: Payer: Medicare Other | Attending: Internal Medicine

## 2019-04-28 DIAGNOSIS — Z23 Encounter for immunization: Secondary | ICD-10-CM

## 2019-04-28 NOTE — Progress Notes (Signed)
   Covid-19 Vaccination Clinic  Name:  Danielle [name]    MRN: [id number] DOB: 1946/02/24  04/28/2019  [name] was observed post Covid-19 immunization for 15 minutes without incidence. She was provided with Vaccine Information Sheet and instruction to access the V-Safe system.   [name] was instructed to call 911 with any severe reactions post vaccine: Marland Kitchen Difficulty breathing  . Swelling of your face and throat  . A fast heartbeat  . A bad rash all over your body  . Dizziness and weakness    Immunizations Administered    Name Date Dose VIS Date Route   Moderna COVID-19 Vaccine 04/28/2019 10:20 AM 0.5 mL 01/30/2019 Intramuscular   Manufacturer: Moderna   Lot: [id number]   [id number]

## 2019-05-04 ENCOUNTER — Other Ambulatory Visit: Payer: Self-pay | Admitting: Hematology

## 2019-05-16 NOTE — Progress Notes (Signed)
[place]   [phone] [phone]   Clinic Follow up Note   Patient Care Team: [name], [name], MD as PCP - General (Family Medicine) End, Harrell Gave, MD as PCP - Cardiology (Cardiology) [name], MD as Consulting Physician (Hematology) Kyung Rudd, MD as Consulting Physician (Radiation Oncology) Rolm Bookbinder, MD as Consulting Physician (General Surgery) Delice Bison, Charlestine Massed, NP as Nurse Practitioner (Hematology and Oncology)  Date of Service:  05/21/2019  CHIEF COMPLAINT: F/u of left breast cancerand left breast cellulitis  SUMMARY OF ONCOLOGIC HISTORY: Oncology History Overview Note  Cancer Staging Malignant neoplasm of upper-outer quadrant of left breast in female, estrogen receptor positive ([place]) Staging form: Breast, AJCC 8th Edition - Clinical stage from 12/13/2016: Stage IB (cT2, cN0, cM0, G2, ER: Positive, PR: Positive, HER2: Negative) - Signed by [name], MD on 12/21/2016 - Pathologic stage from 01/14/2017: Stage IA (pT2, pN0, cM0, G1, ER: Positive, PR: Positive, HER2: Negative, Oncotype DX score: 4) - Signed by [name], MD on 04/17/2017     Malignant neoplasm of upper-outer quadrant of left breast in female, estrogen receptor positive ([place])  12/06/2016 Mammogram   Diagnostic Mammogram 12/06/16 IMPRESSION:  Suspicious mass in the left breast at 3 o'clock 2 cm from the nipple measuring 1.9 x 1.1 x 2.2 cm. RECOMMENDATION: Ultrasound-guided core biopsy of the mass in the 3 o'clock region of the left breast is recommended. The biopsy will be scheduled at the patient's convenience.   12/13/2016 Initial Biopsy   Diagnosis 12/13/16 Breast, left, needle core biopsy, 3:00 o'clock, 2cmfn - INVASIVE DUCTAL CARCINOMA - SEE COMMENT   12/16/2016 Initial Diagnosis   Malignant neoplasm of upper-outer quadrant of left breast in female, estrogen receptor positive ([place])   12/17/2016 Receptors her2   Estrogen Receptor: 100%,  POSITIVE, STRONG STAINING INTENSITY Progesterone Receptor: 100%, POSITIVE, STRONG STAINING INTENSITY Proliferation Marker Ki67: 30% HER2 - NEGATIVE    01/14/2017 Surgery   Left breast lumpectomy with Dr. Donne Hazel   01/14/2017 Pathology Results   Diagnosis 01/14/17 1. Breast, lumpectomy, Left - INVASIVE DUCTAL CARCINOMA, GRADE I/III, SPANNING 2.1 CM. - DUCTAL CARCINOMA IN SITU, LOW GRADE. - INVASIVE CARCINOMA IS BROADLY PRESENT AT THE INFERIOR MARGIN OF SPECIMEN 1. - DUCTAL CARCINOMA IN SITU IS FOCALLY PRESENT AT THE INFERIOR MARGIN OF SPECIMEN 1 AND BROADLY LESS THAN 0.1 CM TO THE LATERAL MARGIN OF SPECIMEN 1. - SEE ONCOLOGY TABLE BELOW. 2. Breast, excision, Additional medial margin left - DUCTAL CARCINOMA IN SITU, LOW GRADE. - DUCTAL CARCINOMA IS FOCALLY LESS THAN 0.1 CM TO THE NEW MARGIN OF SPECIMEN 2. 3. Breast, excision, Additional lateral margin left - DUCTAL CARCINOMA IN SITU, LOW GRADE. - DUCTAL CARCINOMA IN SITU IS GREATER THAN 0.2 CM TO ALL MARGINS. 4. Breast, excision, Additional superior margin left - DUCTAL CARCINOMA IN SITU, LOW GRADE. - DUCTAL CARCINOMA IN SITU IS BROADLY LESS THAN 0.1 CM TO THE NEW MARGIN OF SPECIMEN 4. 5. Lymph node, sentinel, biopsy, Left axillary - THERE IS NO EVIDENCE OF CARCINOMA IN 1 OF 1 LYMPH NODE (0/1). 6. Breast, excision, Additional inferior margin left - DUCTAL CARCINOMA IN SITU, LOW GRADE. - DUCTAL CARCINOMA IN SITU IS GREATER THAN 0.2 CM TO ALL MARGINS.    01/14/2017 Oncotype testing   Her oncotype recurrence score is 4 and her distance recurrent on Tamoxifen alone is 3%.   03/08/2017 - 04/05/2017 Radiation Therapy   RT with Dr. Lisbeth Renshaw    06/2017 -  Anti-estrogen oral therapy   Tamoxifen daily  12/15/2017 Mammogram   12/15/2017 Mammogram IMPRESSION: New lumpectomy site left breast. No mammographic evidence of malignancy in either breast.      CURRENT THERAPY:  Adjuvant Tamoxifen 20 mg daily started on 04/20/17  INTERVAL  HISTORY:  Danielle [name] is here for a follow up of left breast cancer. She was last seen by me 6 months ago. She presents to the clinic with her husband. She notes she is doing well. She notes she was placed on [place] by her cardiologist, Dr. Saunders Revel. Her latest EF was 35%. She notes she is SOB. She had inhaler with her. She will use it before she walks. She ambulates with walker and denies recent fall. She cannot walk far without having to rest. She notes having back pain which is chronic. This is exacerbated by standing and activity. This is related to her Arthritis. She notes she does not eat much vegetables. She notes she still has left breast pain. She notes her last colonoscopy was fine. She notes she continues to take Coumadin and tolerate well.     REVIEW OF SYSTEMS:   Constitutional: Denies fevers, chills or abnormal weight loss Eyes: Denies blurriness of vision Ears, nose, mouth, throat, and face: Denies mucositis or sore throat Respiratory: Denies cough or wheezes (+)SOB Cardiovascular: Denies palpitation, chest discomfort or lower extremity swelling Gastrointestinal:  Denies nausea, heartburn or change in bowel habits Skin: Denies abnormal skin rashes MSK: (+)  Back pain Lymphatics: Denies new lymphadenopathy or easy bruising Neurological:Denies numbness, tingling or new weaknesses Behavioral/Psych: Mood is stable, no new changes  Breast: (+) Left breast pain  All other systems were reviewed with the patient and are negative.  MEDICAL HISTORY:  Past Medical History:  Diagnosis Date  . Allergy   . Anxiety   . Aortic stenosis    Status post bioprosthetic AVR  . Arthritis    DJD  . Asthma   . Breast cancer ([place]) 12/13/2016   Left breast  . Chronic systolic CHF (congestive heart failure) ([place])    EF 30% 04/2014  . COPD (chronic obstructive pulmonary disease) ([place])   . Coronary artery disease    per pt, had LHC prior to AVR in [place] that did not show any  blockages; no stents/bypass  . Gastritis   . Hyperlipidemia   . Hypertension   . Hyperthyroidism   . Multiple thyroid nodules   . Osteoporosis   . Paroxysmal atrial fibrillation ([facility])   . Pelvis fracture ([place]) 08/19/2015   MULTIPLE   . Personal history of radiation therapy 2018  . Spontaneous pneumothorax 11/28/2015   left   . Stroke ([place]) 2000   rt hand weak    SURGICAL HISTORY: Past Surgical History:  Procedure Laterality Date  . BREAST LUMPECTOMY Left 12/2016  . BREAST LUMPECTOMY WITH RADIOACTIVE SEED AND SENTINEL LYMPH NODE BIOPSY Left 01/14/2017   Procedure: LEFT BREAST LUMPECTOMY WITH RADIOACTIVE SEED AND SENTINEL LYMPH NODE BIOPSY;  Surgeon: Rolm Bookbinder, MD;  Location: [facility];  Service: General;  Laterality: Left;  . CHEST TUBE INSERTION  10/2015  . INTRAMEDULLARY (IM) NAIL INTERTROCHANTERIC Left 12/10/2015   Procedure: INTRAMEDULLARY (IM) NAIL INTERTROCHANTRIC;  Surgeon: Leandrew Koyanagi, MD;  Location: [facility];  Service: Orthopedics;  Laterality: Left;  . PLEURADESIS Left 12/03/2015   Procedure: PLEURADESIS;  Surgeon: Melrose Nakayama, MD;  Location: [facility];  Service: Thoracic;  Laterality: Left;  . RESECTION OF APICAL BLEB Left 12/03/2015   Procedure: BLEBECTOMY;  Surgeon: Melrose Nakayama, MD;  Location: [facility];  Service: Thoracic;  Laterality: Left;  . THORACOSCOPY  12/03/2015  . VALVE REPLACEMENT    . VIDEO ASSISTED THORACOSCOPY Left 12/03/2015   Procedure: VIDEO ASSISTED THORACOSCOPY;  Surgeon: Melrose Nakayama, MD;  Location: [facility];  Service: Thoracic;  Laterality: Left;    I have reviewed the social history and family history with the patient and they are unchanged from previous note.  ALLERGIES:  is allergic to lisinopril and tetanus toxoid adsorbed.  MEDICATIONS:  Current Outpatient Medications  Medication Sig Dispense Refill  . acetaminophen (TYLENOL) 500 MG tablet Take 1,000 mg by mouth daily as needed (PAIN).    Marland Kitchen albuterol (PROVENTIL  HFA;VENTOLIN HFA) 108 (90 Base) MCG/ACT inhaler Inhale 2 puffs into the lungs every 6 (six) hours as needed for wheezing or shortness of breath.     Marland Kitchen alendronate (FOSAMAX) 70 MG tablet Take 1 tablet (70 mg total) by mouth once a week. Take with a full glass of water on an empty stomach. 13 tablet 3  . aspirin EC 81 MG tablet Take 81 mg daily by mouth.    . budesonide-formoterol (SYMBICORT) 160-4.5 MCG/ACT inhaler Inhale 2 puffs into the lungs 2 (two) times daily. 1 Inhaler 0  . carvedilol (COREG) 25 MG tablet Take 1 tablet by mouth twice daily 180 tablet 0  . Cholecalciferol (VITAMIN D3) 400 units tablet Take 1 tablet (400 Units total) by mouth daily. 30 tablet 0  . diclofenac sodium (VOLTAREN) 1 % GEL Apply 4 g topically 4 (four) times daily. 500 g 3  . diphenhydramine-acetaminophen (TYLENOL PM) 25-500 MG TABS tablet Take 2 tablets by mouth at bedtime.     . furosemide (LASIX) 20 MG tablet Take 1 tablet (20 mg total) by mouth daily. 90 tablet 2  . methimazole (TAPAZOLE) 5 MG tablet TAKE 3 TABLETS BY MOUTH EVERY EVENING. FOLLOW UP APPT IS NEEDED BEFORE MORE REFILLS ARE AUTHORIZED. 180 tablet 3  . PREVNAR 13 SUSP injection     . rosuvastatin (CRESTOR) 40 MG tablet Take 1 tablet by mouth once daily 90 tablet 0  . sacubitril-valsartan (ENTRESTO) 49-51 MG Take 0.5 tablets by mouth 2 (two) times daily. 90 tablet 0  . senna-docusate (SENOKOT-S) 8.6-50 MG tablet Take 2 tablets at bedtime by mouth.     . tamoxifen (NOLVADEX) 20 MG tablet Take 1 tablet by mouth once daily 90 tablet 2  . traMADol (ULTRAM) 50 MG tablet Take 1 tablet (50 mg total) by mouth 3 (three) times daily. 90 tablet 5  . valACYclovir (VALTREX) 500 MG tablet Take 1 tablet (500 mg total) by mouth 2 (two) times daily. For 3 days when outbreaks. (Patient taking differently: Take 500 mg by mouth 2 (two) times daily as needed (outbreaks). For 3 days when outbreaks.) 24 tablet 1  . warfarin (COUMADIN) 1 MG tablet 1 tab wednesdays (Patient  taking differently: Take 1 mg by mouth once a week. Take 1 tablet (1 mg) Only on Wednesday) 20 tablet 2  . warfarin (COUMADIN) 3 MG tablet TAKE AS DIRECTED BY ANTICOAGULATION CLINIC. 90 tablet 1   No current facility-administered medications for this visit.    PHYSICAL EXAMINATION: ECOG PERFORMANCE STATUS: 2 - Symptomatic, <50% confined to bed  Vitals:   05/21/19 1004  BP: (!) 133/94  Pulse: 82  Resp: 18  Temp: 98 F (36.7 C)  SpO2: 100%   Filed Weights   05/21/19 1004  Weight: 254 lb 6.4 oz (115.4 kg)    GENERAL:alert, no distress and  comfortable SKIN: skin color, texture, turgor are normal, no rashes or significant lesions EYES: normal, Conjunctiva are pink and non-injected, sclera clear  NECK: supple, thyroid normal size, non-tender, without nodularity LYMPH:  no palpable lymphadenopathy in the cervical, axillary  LUNGS: clear to auscultation and percussion with normal breathing effort HEART: regular rate & rhythm and no murmurs and no lower extremity edema (+) midline surgical scar from previous open heart surgery ABDOMEN:abdomen soft, non-tender and normal bowel sounds Musculoskeletal:no cyanosis of digits and no clubbing  NEURO: alert & oriented x 3 with fluent speech, no focal motor/sensory deficits BREAST: S/p left lumpectomy: Surgical incision healed well with skin hyperpigmentation. Tenderness of left breast. No palpable mass, nodules or adenopathy bilaterally. Breast exam benign.   LABORATORY DATA:  I have reviewed the data as listed CBC Latest Ref Rng & Units 05/21/2019 11/20/2018 09/08/2018  WBC 4.0 - 10.5 K/uL 4.3 4.5 4.7  Hemoglobin 12.0 - 15.0 g/dL 12.8 12.3 12.0  Hematocrit 36.0 - 46.0 % 40.0 38.1 37.9  Platelets 150 - 400 K/uL 94(L) 84(L) 102(L)     CMP Latest Ref Rng & Units 05/21/2019 04/10/2019 03/20/2019  Glucose 70 - 99 mg/dL 106(H) 98 98  BUN 8 - 23 mg/dL _0 Creatinine 0.44 - 1.00 mg/dL 1.15(H) 1.09(H) 1.19(H)  Sodium 135 - 145 mmol/L 144 141  143  Potassium 3.5 - 5.1 mmol/L 4.7 4.9 5.5(H)  Chloride 98 - 111 mmol/L 112(H) 107(H) 108(H)  CO2 22 - 32 mmol/L _1 Calcium 8.9 - 10.3 mg/dL 9.7 9.7 10.2  Total Protein 6.5 - 8.1 g/dL 7.1 - -  Total Bilirubin 0.3 - 1.2 mg/dL 0.5 - -  Alkaline Phos 38 - 126 U/L 49 - -  AST 15 - 41 U/L 13(L) - -  ALT 0 - 44 U/L 11 - -      RADIOGRAPHIC STUDIES: I have personally reviewed the radiological images as listed and agreed with the findings in the report. No results found.   ASSESSMENT & PLAN:  Danielle [name] is a 74 y.o. female with    1. Malignant neoplasm of upper-outer quadrant of left breast, Stage IB, c(T2,N0,M0 ), ER/PR: POSITIVE, HER2: NEGATIVE, Grade II -She was diagnosed in 11/2016. She is s/p left breastlumpectomyand adjuvant radiation.  -the Oncotype Dx result was previously reviewed with her in details. She has low risk based on the recurrence score, chemo was not recommended  -She started Tamoxifen in 06/2017. Tolerating well, will continue for 5 to 10 years.Due to her severe arthritis, she may not be able to tolerate aromatase inhibitor. -I discussed there is a mild drug interactionbetweenTamoxifen and Coumadin given Tamoxifen can increase her coumadin level. She has been on both for over a year now without issue.She follows her PCP for PT/INR check monthly.  -She is clinically doing well. Lab reviewed, her CBC and CMP are within normal limits except plt 94K. Her physical exam and her 11/2018 mammogram were unremarkable. There is no clinical concern for recurrence. -Continue surveillance. Next mammogram in 11/2019 -Continue Tamoxifen.  -F/u in 6 months.     2. 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 Osteoporosis, Arthritis with back pain  -Underwent Left Hip surgery with rod placement in 2017, ambulates with walker -Her 10/2018 DEXA shows osteoporosis with lowest T-score -2.8 -On Fosamax -She was previously able to purposefully lose 20 pounds with diet and exercise. Her goal weight is 190 pounds. I encouraged her to continue.  -Arthritis with chronic back pain. She notes her back pain has progressed. She will continues to f/u with pain specialist for possible injections.  -Repeat in 10/2020   4. CAD, Afib, CHF, Aortic Stenosis, H/o Stroke -On Coreg, Coumadin, Clinically stable -follow-up with her primary care physician -She has been more chronically short of breath lately. She will continues inhaler as needed, mostly before walking. She ambulated with walker, no recent falls.   5.Insomnia -I recommend OTC benadryl or melatonin  -I also reviewed sleep hygiene with her.  6. Mild Thrombocytopenia, likely ITP  -Per pt she did have Platelet transfusion with her prior heart surgery.  -At this mild level, there is no indication for treatment. Will monitor.she is fine to continue Coumadin.  -If she develops nose, gum or GI bleeding she should contact Dr. [name] or our clinic.  -Moderate and stable     PLAN: -She is clinically doing well  -Continue Tamoxifen  -Mammogram in 11/2019 -lab and f/u in 6 months, she prefers to be seen in person.    No problem-specific Assessment & Plan notes found for this encounter.   No orders of the defined types were placed in this encounter.  All questions were answered. The patient knows to call the clinic with any problems, questions or concerns. No barriers to learning was  detected. The total time spent in the appointment was 25 minutes.     [name], MD 05/21/2019   I, Joslyn Devon, am acting as scribe for [name], MD.   I have reviewed the above documentation for accuracy and completeness, and I agree with the above.

## 2019-05-21 ENCOUNTER — Inpatient Hospital Stay: Payer: Medicare Other | Admitting: Hematology

## 2019-05-21 ENCOUNTER — Encounter: Payer: Self-pay | Admitting: Hematology

## 2019-05-21 ENCOUNTER — Other Ambulatory Visit: Payer: Self-pay

## 2019-05-21 ENCOUNTER — Inpatient Hospital Stay: Payer: Medicare Other | Attending: Hematology

## 2019-05-21 VITALS — BP 133/94 | HR 82 | Temp 98.0°F | Resp 18 | Ht 67.0 in | Wt 254.4 lb

## 2019-05-21 DIAGNOSIS — I110 Hypertensive heart disease with heart failure: Secondary | ICD-10-CM | POA: Insufficient documentation

## 2019-05-21 DIAGNOSIS — M810 Age-related osteoporosis without current pathological fracture: Secondary | ICD-10-CM | POA: Insufficient documentation

## 2019-05-21 DIAGNOSIS — Z170 Estrogen receptor positive status [ER+]: Secondary | ICD-10-CM | POA: Insufficient documentation

## 2019-05-21 DIAGNOSIS — N61 Mastitis without abscess: Secondary | ICD-10-CM | POA: Insufficient documentation

## 2019-05-21 DIAGNOSIS — C50412 Malignant neoplasm of upper-outer quadrant of left female breast: Secondary | ICD-10-CM | POA: Diagnosis not present

## 2019-05-21 DIAGNOSIS — F419 Anxiety disorder, unspecified: Secondary | ICD-10-CM | POA: Insufficient documentation

## 2019-05-21 DIAGNOSIS — Z8673 Personal history of transient ischemic attack (TIA), and cerebral infarction without residual deficits: Secondary | ICD-10-CM | POA: Insufficient documentation

## 2019-05-21 DIAGNOSIS — Z79810 Long term (current) use of selective estrogen receptor modulators (SERMs): Secondary | ICD-10-CM | POA: Insufficient documentation

## 2019-05-21 DIAGNOSIS — D696 Thrombocytopenia, unspecified: Secondary | ICD-10-CM | POA: Insufficient documentation

## 2019-05-21 DIAGNOSIS — I480 Paroxysmal atrial fibrillation: Secondary | ICD-10-CM | POA: Insufficient documentation

## 2019-05-21 DIAGNOSIS — I2510 Atherosclerotic heart disease of native coronary artery without angina pectoris: Secondary | ICD-10-CM | POA: Insufficient documentation

## 2019-05-21 DIAGNOSIS — I350 Nonrheumatic aortic (valve) stenosis: Secondary | ICD-10-CM | POA: Insufficient documentation

## 2019-05-21 DIAGNOSIS — J449 Chronic obstructive pulmonary disease, unspecified: Secondary | ICD-10-CM | POA: Insufficient documentation

## 2019-05-21 DIAGNOSIS — Z7982 Long term (current) use of aspirin: Secondary | ICD-10-CM | POA: Insufficient documentation

## 2019-05-21 DIAGNOSIS — E785 Hyperlipidemia, unspecified: Secondary | ICD-10-CM | POA: Diagnosis not present

## 2019-05-21 DIAGNOSIS — G4700 Insomnia, unspecified: Secondary | ICD-10-CM | POA: Insufficient documentation

## 2019-05-21 DIAGNOSIS — Z79899 Other long term (current) drug therapy: Secondary | ICD-10-CM | POA: Insufficient documentation

## 2019-05-21 DIAGNOSIS — G8929 Other chronic pain: Secondary | ICD-10-CM | POA: Diagnosis not present

## 2019-05-21 DIAGNOSIS — I5022 Chronic systolic (congestive) heart failure: Secondary | ICD-10-CM | POA: Diagnosis not present

## 2019-05-21 DIAGNOSIS — M1990 Unspecified osteoarthritis, unspecified site: Secondary | ICD-10-CM | POA: Diagnosis not present

## 2019-05-21 DIAGNOSIS — M549 Dorsalgia, unspecified: Secondary | ICD-10-CM | POA: Insufficient documentation

## 2019-05-21 LAB — CMP (CANCER CENTER ONLY)
ALT: 11 U/L (ref 0–44)
AST: 13 U/L — ABNORMAL LOW (ref 15–41)
Albumin: 3.8 g/dL (ref 3.5–5.0)
Alkaline Phosphatase: 49 U/L (ref 38–126)
Anion gap: 8 (ref 5–15)
BUN: 11 mg/dL (ref 8–23)
CO2: 24 mmol/L (ref 22–32)
Calcium: 9.7 mg/dL (ref 8.9–10.3)
Chloride: 112 mmol/L — ABNORMAL HIGH (ref 98–111)
Creatinine: 1.15 mg/dL — ABNORMAL HIGH (ref 0.44–1.00)
GFR, Est AFR Am: 55 mL/min — ABNORMAL LOW (ref >60)
GFR, Estimated: 47 mL/min — ABNORMAL LOW (ref >60)
Glucose, Bld: 106 mg/dL — ABNORMAL HIGH (ref 70–99)
Potassium: 4.7 mmol/L (ref 3.5–5.1)
Sodium: 144 mmol/L (ref 135–145)
Total Bilirubin: 0.5 mg/dL (ref 0.3–1.2)
Total Protein: 7.1 g/dL (ref 6.5–8.1)

## 2019-05-21 LAB — CBC WITH DIFFERENTIAL (CANCER CENTER ONLY)
Abs Immature Granulocytes: 0.01 10*3/uL (ref 0.00–0.07)
Basophils Absolute: 0 10*3/uL (ref 0.0–0.1)
Basophils Relative: 1 %
Eosinophils Absolute: 0.4 10*3/uL (ref 0.0–0.5)
Eosinophils Relative: 8 %
HCT: 40 % (ref 36.0–46.0)
Hemoglobin: 12.8 g/dL (ref 12.0–15.0)
Immature Granulocytes: 0 %
Lymphocytes Relative: 26 %
Lymphs Abs: 1.1 10*3/uL (ref 0.7–4.0)
MCH: 31 pg (ref 26.0–34.0)
MCHC: 32 g/dL (ref 30.0–36.0)
MCV: 96.9 fL (ref 80.0–100.0)
Monocytes Absolute: 0.5 10*3/uL (ref 0.1–1.0)
Monocytes Relative: 11 %
Neutro Abs: 2.3 10*3/uL (ref 1.7–7.7)
Neutrophils Relative %: 54 %
Platelet Count: 94 10*3/uL — ABNORMAL LOW (ref 150–400)
RBC: 4.13 MIL/uL (ref 3.87–5.11)
RDW: 14 % (ref 11.5–15.5)
WBC Count: 4.3 10*3/uL (ref 4.0–10.5)
nRBC: 0 % (ref 0.0–0.2)

## 2019-05-22 ENCOUNTER — Telehealth: Payer: Self-pay | Admitting: Hematology

## 2019-05-22 NOTE — Telephone Encounter (Signed)
Scheduled appt per 3/22 los.  Sent a message to HIM pool to get a calendar mailed out.

## 2019-05-31 ENCOUNTER — Other Ambulatory Visit: Payer: Self-pay

## 2019-06-04 ENCOUNTER — Ambulatory Visit (INDEPENDENT_AMBULATORY_CARE_PROVIDER_SITE_OTHER): Payer: Medicare Other | Admitting: General Practice

## 2019-06-04 ENCOUNTER — Other Ambulatory Visit: Payer: Self-pay

## 2019-06-04 DIAGNOSIS — Z7901 Long term (current) use of anticoagulants: Secondary | ICD-10-CM | POA: Diagnosis not present

## 2019-06-04 LAB — POCT INR: INR: 3.4 — AB (ref 2.0–3.0)

## 2019-06-04 NOTE — Patient Instructions (Addendum)
Pre visit review using our clinic review tool, if applicable. No additional management support is needed unless otherwise documented below in the visit note.  Skip today (4/5) and then change dosage and take 3 mg daily except 1.5 mg on Wed and Saturdays.  Re-check in 3 to 4 weeks.

## 2019-06-11 ENCOUNTER — Other Ambulatory Visit: Payer: Self-pay | Admitting: Family Medicine

## 2019-06-11 ENCOUNTER — Other Ambulatory Visit: Payer: Self-pay | Admitting: Internal Medicine

## 2019-06-12 ENCOUNTER — Other Ambulatory Visit: Payer: Self-pay | Admitting: Internal Medicine

## 2019-06-12 NOTE — Progress Notes (Signed)
Follow-up Outpatient Visit Date: 06/13/2019  Primary Care Provider: [name], [name], MD [address] [place] [address]  Chief Complaint: Follow-up heart failure and valvular heart disease  HPI:  Danielle [name] is a 74 y.o. female with history of aortic valve disease (severe regurgitation by her description) s/p bioprosthetic aortic valve replacement in [place] in 03/2014, NICM with LVEF as low as 30-35% by report in 03/2015, paroxysmal atrial fibrillation on chronic warfarin, stroke x 2, hyperlipidemia, hypertension, hyperthyroidism,left breast cancer,spontaneous pneumothorax, and left hip fracture, who presents for follow-up of chronic systolic heart failure, atrial fibrillation, and valvular heart disease.  I last saw her in the office in January, at which time she had stable exertional dyspnea and 2-3 pillow orthopnea.  We agreed to transition from losartan to [facility] but had to reduce the dose due to mild hyperkalemia.  Today, Danielle [name] feels about the same as at our last visit.  She is gradually increasing her activity following her hip fracture, walking more with her walker.  She has chronic exertional dyspnea that has been stable for years.  She denies chest pain, palpitations, lightheadedness, and edema.  She has not had any bleeding, remaining on aspirin and warfarin.  She has not had any further falls.  --------------------------------------------------------------------------------------------------  Cardiovascular History & Procedures: Cardiovascular Problems:  Aortic valve disease status post bioprosthetic AVR in 03/2014  Non-ischemic cardiomyopathy  Paroxysmal atrial fibrillation  Stroke  Risk Factors:  Hypertension, hyperlipidemia, stroke, and age > 52  Cath/PCI:  R/LHC (02/12/2014, [place]): Moderate proximal LAD calcification with mild, nonobstructive disease.  Mildly reduced LVEF with severe apical hypokinesis.  Normal  pulmonary artery pressure.  CV Surgery:  Bioprosthetic aortic valve replacement (03/12/14, [place], [place])  EP Procedures and Devices:  None  Non-Invasive Evaluation(s):  TTE (01/18/2019): Normal LV size with LVEF 35-40%.  Mild LVH.  Grade 1 diastolic dysfunction.  Is in function.  Bioprosthetic aortic valve with normal function.  Normal PA pressure.  TTE (04/21/16): Normal obese size with moderate LVH. LVEF 35-40% with mid and apical anteroseptal hypokinesis. Grade 3 diastolic dysfunction noted. Aortic valve bioprosthesis present with a mean gradient of 11 mmHg. Mitral annular calcification noted. Normal RV size and function. Mild right atrial enlargement.  TTE (03/27/15, OSH): Mild LVH with LVEF 30-35%, mild left atrial enlargement, AVR in place without regurgitation.  TTE (05/10/14, OSH): LVEF 45-50%.  Recent CV Pertinent Labs: Lab Results  Component Value Date   CHOL 161 01/29/2019   HDL 64.90 01/29/2019   LDLCALC 81 01/29/2019   TRIG 77.0 01/29/2019   CHOLHDL 2 01/29/2019   INR 3.4 (A) 06/04/2019   INR 2.3 (H) 09/01/2018   K 4.7 05/21/2019   K 4.3 12/22/2016   MG 2.1 08/28/2018   BUN 11 05/21/2019   BUN 14 04/10/2019   BUN 19.5 12/22/2016   CREATININE 1.15 (H) 05/21/2019   CREATININE 1.2 (H) 12/22/2016    Past medical and surgical history were reviewed and updated in EPIC.  Current Meds  Medication Sig  . acetaminophen (TYLENOL) 500 MG tablet Take 1,000 mg by mouth daily as needed (PAIN).  Marland Kitchen albuterol (PROVENTIL HFA;VENTOLIN HFA) 108 (90 Base) MCG/ACT inhaler Inhale 2 puffs into the lungs every 6 (six) hours as needed for wheezing or shortness of breath.   Marland Kitchen alendronate (FOSAMAX) 70 MG tablet Take 1 tablet (70 mg total) by mouth once a week. Take with a full glass of water on an empty stomach.  Marland Kitchen aspirin EC 81  MG tablet Take 81 mg daily by mouth.  . budesonide-formoterol (SYMBICORT) 160-4.5 MCG/ACT inhaler Inhale 2 puffs into the lungs 2 (two) times  daily.  . carvedilol (COREG) 25 MG tablet Take 1 tablet by mouth twice daily  . Cholecalciferol (VITAMIN D3) 400 units tablet Take 1 tablet (400 Units total) by mouth daily.  . diclofenac sodium (VOLTAREN) 1 % GEL Apply 4 g topically 4 (four) times daily.  . diphenhydramine-acetaminophen (TYLENOL PM) 25-500 MG TABS tablet Take 2 tablets by mouth at bedtime.   . furosemide (LASIX) 20 MG tablet Take 1 tablet (20 mg total) by mouth daily.  . methimazole (TAPAZOLE) 5 MG tablet TAKE 3 TABLETS BY MOUTH EVERY EVENING. FOLLOW UP APPT IS NEEDED BEFORE MORE REFILLS ARE AUTHORIZED.  Marland Kitchen PREVNAR 13 SUSP injection   . rosuvastatin (CRESTOR) 40 MG tablet Take 1 tablet by mouth once daily  . sacubitril-valsartan (ENTRESTO) 49-51 MG Take 1 tablet by mouth 2 (two) times daily.  Marland Kitchen senna-docusate (SENOKOT-S) 8.6-50 MG tablet Take 2 tablets at bedtime by mouth.   . tamoxifen (NOLVADEX) 20 MG tablet Take 1 tablet by mouth once daily  . traMADol (ULTRAM) 50 MG tablet Take 1 tablet (50 mg total) by mouth 3 (three) times daily.  . valACYclovir (VALTREX) 500 MG tablet Take 1 tablet (500 mg total) by mouth 2 (two) times daily. For 3 days when outbreaks. (Patient taking differently: Take 500 mg by mouth 2 (two) times daily as needed (outbreaks). For 3 days when outbreaks.)  . warfarin (COUMADIN) 1 MG tablet 1 tab wednesdays (Patient taking differently: Take 1 mg by mouth once a week. Take 1 tablet (1 mg) Only on Wednesday)  . warfarin (COUMADIN) 3 MG tablet TAKE AS DIRECTED BY ANTICOAGULATION CLINIC.  . [DISCONTINUED] ENTRESTO 49-51 MG TAKE ONE-HALF TABLET BY  MOUTH TWICE DAILY    Allergies: Lisinopril, Tetanus toxoid adsorbed, and Other  Social History   Tobacco Use  . Smoking status: Former Smoker    Packs/day: 0.25    Years: 10.00    Pack years: 2.50    Types: Cigarettes    Quit date: 03/01/2010    Years since quitting: 9.2  . Smokeless tobacco: Never Used  Substance Use Topics  . Alcohol use: No  . Drug use:  No    Family History  Problem Relation Age of Onset  . Diabetes Mother   . Heart attack Mother 80  . Diabetes Father   . Lung cancer Father   . Diabetes Sister   . Thyroid disease Sister   . Diabetes Sister   . HIV Brother     Review of Systems: A 12-system review of systems was performed and was negative except as noted in the HPI.  --------------------------------------------------------------------------------------------------  Physical Exam: BP 120/90 (BP Location: Right Arm, Patient Position: Sitting, Cuff Size: Large)   Pulse 80   Ht 5\' 7"  (1.702 m)   Wt 256 lb 6 oz (116.3 kg)   SpO2 95%   BMI 40.15 kg/m   General: NAD. HEENT: No conjunctival pallor or scleral icterus. Facemask in place. Neck: No JVD or HJR. Lungs: Normal work of breathing. Clear to auscultation bilaterally without wheezes or crackles. Heart: Regular rate and rhythm with 1/6 systolic murmur. Abd: Bowel sounds present. Soft, NT/ND. Ext: No lower extremity edema.  EKG: Normal sinus rhythm with isolated PVC, borderline LVH, he has well as inferior and anterolateral Q waves.  No significant change from prior tracing on 03/12/2019.  Lab Results  Component Value  Date   WBC 4.3 05/21/2019   HGB 12.8 05/21/2019   HCT 40.0 05/21/2019   MCV 96.9 05/21/2019   PLT 94 (L) 05/21/2019    Lab Results  Component Value Date   NA 144 05/21/2019   K 4.7 05/21/2019   CL 112 (H) 05/21/2019   CO2 24 05/21/2019   BUN 11 05/21/2019   CREATININE 1.15 (H) 05/21/2019   GLUCOSE 106 (H) 05/21/2019   ALT 11 05/21/2019    Lab Results  Component Value Date   CHOL 161 01/29/2019   HDL 64.90 01/29/2019   LDLCALC 81 01/29/2019   TRIG 77.0 01/29/2019   CHOLHDL 2 01/29/2019    --------------------------------------------------------------------------------------------------  ASSESSMENT AND PLAN: Chronic systolic 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 has stable NYHA class II-III heart  failure symptoms.  She appears euvolemic on exam.  I encouraged her to continue increasing her activity as tolerated to help improve her stamina.  We will rechallenge her with Entresto 49/51 mg twice daily, though we will need to closely monitor her potassium given history of hyperkalemia in the past.  We will arrange for a BMP in the [facility] office in 2 weeks.  She should avoid foods with significant potassium content.  We will continue with carvedilol 25 mg twice daily.  Valvular heart disease status post aortic valve replacement: Patient is status post bioprosthetic aortic valve.  No clinical evidence of valve dysfunction today.  We will continue with aspirin and warfarin, given concurrent atrial fibrillation.  Paroxysmal atrial fibrillation: Patient remains in sinus rhythm today.  Continue indefinite anticoagulation with warfarin.  Hypertension: Diastolic blood pressure mildly elevated today.  As above, we will increase Entresto with close monitoring of renal function and potassium.  Morbid obesity: BMI remains above 40.  I have encouraged [name] to continue with increasing her activity as tolerated.  Follow-up: Return to clinic in 4 months.  [name], MD 06/14/2019 8:23 AM

## 2019-06-13 ENCOUNTER — Other Ambulatory Visit: Payer: Self-pay

## 2019-06-13 ENCOUNTER — Ambulatory Visit: Payer: Medicare Other | Admitting: Internal Medicine

## 2019-06-13 ENCOUNTER — Encounter: Payer: Self-pay | Admitting: Internal Medicine

## 2019-06-13 VITALS — BP 120/90 | HR 80 | Ht 67.0 in | Wt 256.4 lb

## 2019-06-13 DIAGNOSIS — I5022 Chronic systolic (congestive) heart failure: Secondary | ICD-10-CM | POA: Diagnosis not present

## 2019-06-13 DIAGNOSIS — I480 Paroxysmal atrial fibrillation: Secondary | ICD-10-CM

## 2019-06-13 DIAGNOSIS — I10 Essential (primary) hypertension: Secondary | ICD-10-CM | POA: Diagnosis not present

## 2019-06-13 DIAGNOSIS — I359 Nonrheumatic aortic valve disorder, unspecified: Secondary | ICD-10-CM

## 2019-06-13 DIAGNOSIS — Z79899 Other long term (current) drug therapy: Secondary | ICD-10-CM

## 2019-06-13 MED ORDER — ENTRESTO 49-51 MG PO TABS: 1.0000 | ORAL_TABLET | Freq: Two times a day (BID) | ORAL | 1 refills | Status: DC

## 2019-06-13 NOTE — Patient Instructions (Signed)
Medication Instructions:  Your physician has recommended you make the following change in your medication:  1- INCREASE Entresto to 49-51 mg (1 tablet) by mouth two times a day.  *If you need a refill on your cardiac medications before your next appointment, please call your pharmacy*   Lab Work: Your physician recommends that you return for lab work in: 2 weeks ~ 06/27/19 for BMET.  You will need appointment for Lab work at the [facility] office at [address]. Please call [phone] to schedule a time that is convenient for you.  If you have labs (blood work) drawn today and your tests are completely normal, you will receive your results only by: Marland Kitchen MyChart Message (if you have MyChart) OR . A paper copy in the mail If you have any lab test that is abnormal or we need to change your treatment, we will call you to review the results.   Testing/Procedures: none   Follow-Up: At [facility], you and your health needs are our priority.  As part of our continuing mission to provide you with exceptional heart care, we have created designated Provider Care Teams.  These Care Teams include your primary Cardiologist (physician) and Advanced Practice Providers (APPs -  Physician Assistants and Nurse Practitioners) who all work together to provide you with the care you need, when you need it.  We recommend signing up for the patient portal called "MyChart".  Sign up information is provided on this After Visit Summary.  MyChart is used to connect with patients for Virtual Visits (Telemedicine).  Patients are able to view lab/test results, encounter notes, upcoming appointments, etc.  Non-urgent messages can be sent to your provider as well.   To learn more about what you can do with MyChart, go to [email or web address].    Your next appointment:   4 month(s)  The format for your next appointment:   In Person  Provider:    You may see [name], MD or one of the following Advanced  Practice Providers on your designated Care Team:    Murray Hodgkins, NP  Christell Faith, PA-C  Marrianne Mood, PA-C

## 2019-06-14 ENCOUNTER — Encounter: Payer: Self-pay | Admitting: Internal Medicine

## 2019-06-18 ENCOUNTER — Telehealth: Payer: Self-pay | Admitting: *Deleted

## 2019-06-18 NOTE — Telephone Encounter (Signed)
Patient's generic brand of Warfarin is on back order, Can we change to another generic brand?

## 2019-06-20 NOTE — Telephone Encounter (Signed)
Her pharmacist could fill the generic (warfarin). Another brand name is Jantoven (same dose). Thanks, BJ

## 2019-06-20 NOTE — Telephone Encounter (Signed)
Spoke with pharmacist and patient's original brand came it. Patient has already picked it up from pharmacy. Nothing further needed at this time.

## 2019-06-25 ENCOUNTER — Telehealth: Payer: Self-pay | Admitting: Family Medicine

## 2019-06-25 NOTE — Progress Notes (Signed)
  Chronic Care Management   Note  06/25/2019 Name: Danielle [name] MRN: [id number] DOB: 01/01/1946  Danielle [name] is a 74 y.o. year old female who is a primary care patient of [name], [name], MD. I reached out to [name] by phone today in response to a referral sent by Danielle [name]'s PCP, [name], [name], MD.   Danielle [name] was given information about Chronic Care Management services today including:  1. CCM service includes personalized support from designated clinical staff supervised by her physician, including individualized plan of care and coordination with other care providers 2. 24/7 contact phone numbers for assistance for urgent and routine care needs. 3. Service will only be billed when office clinical staff spend 20 minutes or more in a month to coordinate care. 4. Only one practitioner may furnish and bill the service in a calendar month. 5. The patient may stop CCM services at any time (effective at the end of the month) by phone call to the office staff.   Patient agreed to services and verbal consent obtained.    This note is not being shared with the patient for the following reason: To respect privacy (The patient or proxy has requested that the information not be shared). Follow up plan:   Danielle [name] [employer] Scheduler

## 2019-06-25 NOTE — Progress Notes (Signed)
  Chronic Care Management   Outreach Note  06/25/2019 Name: Danielle [name] MRN: [id number] DOB: 1946/02/26  Referred by: [name], [name], MD Reason for referral : No chief complaint on file.   An unsuccessful telephone outreach was attempted today. The patient was referred to the pharmacist for assistance with care management and care coordination.    This note is not being shared with the patient for the following reason: To respect privacy (The patient or proxy has requested that the information not be shared).  Follow Up Plan:   [name] [employer] Scheduler

## 2019-06-26 ENCOUNTER — Other Ambulatory Visit: Payer: Self-pay

## 2019-06-26 ENCOUNTER — Encounter: Payer: Medicare Other | Attending: Physical Medicine & Rehabilitation | Admitting: Physical Medicine & Rehabilitation

## 2019-06-26 ENCOUNTER — Encounter: Payer: Self-pay | Admitting: Physical Medicine & Rehabilitation

## 2019-06-26 VITALS — BP 96/71 | HR 80 | Temp 97.8°F | Ht 67.0 in | Wt 256.0 lb

## 2019-06-26 DIAGNOSIS — M25562 Pain in left knee: Secondary | ICD-10-CM | POA: Diagnosis not present

## 2019-06-26 DIAGNOSIS — M25561 Pain in right knee: Secondary | ICD-10-CM | POA: Diagnosis not present

## 2019-06-26 DIAGNOSIS — Z79891 Long term (current) use of opiate analgesic: Secondary | ICD-10-CM | POA: Insufficient documentation

## 2019-06-26 DIAGNOSIS — M48061 Spinal stenosis, lumbar region without neurogenic claudication: Secondary | ICD-10-CM | POA: Insufficient documentation

## 2019-06-26 DIAGNOSIS — Z5181 Encounter for therapeutic drug level monitoring: Secondary | ICD-10-CM | POA: Diagnosis not present

## 2019-06-26 DIAGNOSIS — M159 Polyosteoarthritis, unspecified: Secondary | ICD-10-CM | POA: Insufficient documentation

## 2019-06-26 DIAGNOSIS — G894 Chronic pain syndrome: Secondary | ICD-10-CM | POA: Diagnosis not present

## 2019-06-26 DIAGNOSIS — G8929 Other chronic pain: Secondary | ICD-10-CM | POA: Insufficient documentation

## 2019-06-26 MED ORDER — TRAMADOL HCL 50 MG PO TABS: 50.0000 mg | ORAL_TABLET | Freq: Three times a day (TID) | ORAL | 5 refills | Status: DC

## 2019-06-26 NOTE — Patient Instructions (Signed)
May stay on Coumadin for these injection

## 2019-06-26 NOTE — Progress Notes (Signed)
Subjective:    Patient ID: Danielle [name], female    DOB: [birth year], 74 y.o.   MRN: [id number]  HPI  74 year old female with chronic low back pain.  She has a history of lumbar spondylosis and degenerative disc.  She has lumbar spinal stenosis but no neurogenic claudication.  The patient does have the feeling of having to move her legs mainly when she is in bed.  She has been on gabapentin at night but this did not help his symptoms.  She has not been diagnosed with restless leg syndrome.  She remains on warfarin for atrial fibrillation. Back > Knee pain.  She has completed outpatient PT but this was not very helpful in terms of her pain. Back pain worst with standing . Pain relieved by sitting  Pain persist despite tramadol 50 mg 3 times daily Pain Inventory Average Pain 8 Pain Right Now 8 My pain is aching  In the last 24 hours, has pain interfered with the following? General activity 8 Relation with others 0 Enjoyment of life 0 What TIME of day is your pain at its worst? varies Sleep (in general) Good  Pain is worse with: walking and standing Pain improves with: medication Relief from Meds: 7  Mobility use a walker ability to climb steps?  no do you drive?  no  Function retired  Neuro/Psych trouble walking  Prior Studies Any changes since last visit?  no bone scan  Physicians involved in your care Any changes since last visit?  no   Family History  Problem Relation Age of Onset  . Diabetes Mother   . Heart attack Mother 75  . Diabetes Father   . Lung cancer Father   . Diabetes Sister   . Thyroid disease Sister   . Diabetes Sister   . HIV Brother    Social History   Socioeconomic History  . Marital status: Married    Spouse name: Sherwood  . Number of children: 0  . Years of education: Not on file  . Highest education level: Not on file  Occupational History  . Occupation: Retired in 2004  Tobacco Use  . Smoking status: Former  Smoker    Packs/day: 0.25    Years: 10.00    Pack years: 2.50    Types: Cigarettes    Quit date: 03/01/2010    Years since quitting: 9.3  . Smokeless tobacco: Never Used  Substance and Sexual Activity  . Alcohol use: No  . Drug use: No  . Sexual activity: Not Currently  Other Topics Concern  . Not on file  Social History Narrative   Lives with husband.  Ambulated independently.   Social Determinants of Health   Financial Resource Strain:   . Difficulty of Paying Living Expenses:   Food Insecurity:   . Worried About Charity fundraiser in the Last Year:   . Arboriculturist in the Last Year:   Transportation Needs:   . Film/video editor (Medical):   Marland Kitchen Lack of Transportation (Non-Medical):   Physical Activity:   . Days of Exercise per Week:   . Minutes of Exercise per Session:   Stress:   . Feeling of Stress :   Social Connections:   . Frequency of Communication with Friends and Family:   . Frequency of Social Gatherings with Friends and Family:   . Attends Religious Services:   . Active Member of Clubs or Organizations:   . Attends Archivist Meetings:   .  Marital Status:    Past Surgical History:  Procedure Laterality Date  . BREAST LUMPECTOMY Left 12/2016  . BREAST LUMPECTOMY WITH RADIOACTIVE SEED AND SENTINEL LYMPH NODE BIOPSY Left 01/14/2017   Procedure: LEFT BREAST LUMPECTOMY WITH RADIOACTIVE SEED AND SENTINEL LYMPH NODE BIOPSY;  Surgeon: Rolm Bookbinder, MD;  Location: [facility];  Service: General;  Laterality: Left;  . CHEST TUBE INSERTION  10/2015  . INTRAMEDULLARY (IM) NAIL INTERTROCHANTERIC Left 12/10/2015   Procedure: INTRAMEDULLARY (IM) NAIL INTERTROCHANTRIC;  Surgeon: Leandrew Koyanagi, MD;  Location: [facility];  Service: Orthopedics;  Laterality: Left;  . PLEURADESIS Left 12/03/2015   Procedure: PLEURADESIS;  Surgeon: Melrose Nakayama, MD;  Location: [place];  Service: Thoracic;  Laterality: Left;  . RESECTION OF APICAL BLEB Left 12/03/2015    Procedure: BLEBECTOMY;  Surgeon: Melrose Nakayama, MD;  Location: [place];  Service: Thoracic;  Laterality: Left;  . THORACOSCOPY  12/03/2015  . VALVE REPLACEMENT    . VIDEO ASSISTED THORACOSCOPY Left 12/03/2015   Procedure: VIDEO ASSISTED THORACOSCOPY;  Surgeon: Melrose Nakayama, MD;  Location: [place];  Service: Thoracic;  Laterality: Left;   Past Medical History:  Diagnosis Date  . Allergy   . Anxiety   . Aortic stenosis    Status post bioprosthetic AVR  . Arthritis    DJD  . Asthma   . Breast cancer ([place]) 12/13/2016   Left breast  . Chronic systolic CHF (congestive heart failure) ([place])    EF 30% 04/2014  . COPD (chronic obstructive pulmonary disease) ([place])   . Coronary artery disease    per pt, had LHC prior to AVR in [place] that did not show any blockages; no stents/bypass  . Gastritis   . Hyperlipidemia   . Hypertension   . Hyperthyroidism   . Multiple thyroid nodules   . Osteoporosis   . Paroxysmal atrial fibrillation ([facility])   . Pelvis fracture ([place]) 08/19/2015   MULTIPLE   . Personal history of radiation therapy 2018  . Spontaneous pneumothorax 11/28/2015   left   . Stroke ([place]) 2000   rt hand weak   BP 96/71   Pulse 80   Temp 97.8 F (36.6 C)   Ht 5\' 7"  (1.702 m)   Wt 256 lb (116.1 kg)   SpO2 94%   BMI 40.10 kg/m   Opioid Risk Score:   Fall Risk Score:  `1  Depression screen PHQ 2/9  Depression screen [facility] 2/9 09/18/2018 09/18/2018 01/04/2018 10/01/2017 09/28/2017 06/21/2017 05/03/2017  Decreased Interest 0 0 0 0 0 0 0  Down, Depressed, Hopeless 0 0 0 0 0 0 0  PHQ - 2 Score 0 0 0 0 0 0 0  Some recent data might be hidden   Review of Systems  Musculoskeletal: Positive for back pain. Negative for arthralgias.  All other systems reviewed and are negative.      Objective:   Physical Exam Vitals and nursing note reviewed.  Constitutional:      Appearance: Normal appearance.  HENT:     Head: Normocephalic and atraumatic.  Eyes:     Extraocular  Movements: Extraocular movements intact.     Conjunctiva/sclera: Conjunctivae normal.     Pupils: Pupils are equal, round, and reactive to light.  Skin:    General: Skin is warm and dry.  Neurological:     General: No focal deficit present.     Mental Status: She is alert and oriented to person, place, and time.  Psychiatric:  Mood and Affect: Mood normal.        Behavior: Behavior normal.   There is tenderness palpation lumbar paraspinals more on the right side than on the left side.  This is mainly at L4 and L5 area. Negative straight leg raise bilaterally Motion and strength normal Patient ambulates with a walker, forward flexed posture She is morbidly obese        Assessment & Plan:  #1.  Chronic low back pain she has lumbar spinal stenosis secondary to lumbar degenerative disc plus lumbar facet arthropathy.  I do think the facet arthropathy is the main pain generator We will schedule for lumbar medial branch blocks bilateral L3-L4 as well as L5 dorsal ramus  2.  Chronic knee pain at this point the back is a lot worse than the knee.  She did not improve after physical therapy.  She may benefit from genicular nerve blocks if this becomes a primary concern.  Continue tramadol 50 mg 3 times daily PDMP reviewed Oral swab today

## 2019-06-27 ENCOUNTER — Telehealth: Payer: Self-pay | Admitting: Physical Medicine & Rehabilitation

## 2019-06-27 NOTE — Telephone Encounter (Signed)
Patient states she was under the impression that a prescription was going to be sent in for her. Similar to Gabapentin but not Gabapentin because didn't help.

## 2019-06-27 NOTE — Telephone Encounter (Signed)
Patient was seen by Dr. Letta Pate yesterday, and he told patient that he was going to send something to pharmacy for her leg pain.  Please call patient.

## 2019-06-28 ENCOUNTER — Other Ambulatory Visit: Payer: Self-pay | Admitting: Physical Medicine & Rehabilitation

## 2019-06-28 ENCOUNTER — Other Ambulatory Visit: Payer: Medicare Other

## 2019-06-28 ENCOUNTER — Other Ambulatory Visit: Payer: Self-pay

## 2019-06-28 DIAGNOSIS — Z79899 Other long term (current) drug therapy: Secondary | ICD-10-CM

## 2019-06-28 DIAGNOSIS — I10 Essential (primary) hypertension: Secondary | ICD-10-CM | POA: Diagnosis not present

## 2019-06-28 DIAGNOSIS — I5022 Chronic systolic (congestive) heart failure: Secondary | ICD-10-CM | POA: Diagnosis not present

## 2019-06-28 LAB — BASIC METABOLIC PANEL
BUN/Creatinine Ratio: 12 (ref 12–28)
BUN: 14 mg/dL (ref 8–27)
CO2: 21 mmol/L (ref 20–29)
Calcium: 9.6 mg/dL (ref 8.7–10.3)
Chloride: 108 mmol/L — ABNORMAL HIGH (ref 96–106)
Creatinine, Ser: 1.13 mg/dL — ABNORMAL HIGH (ref 0.57–1.00)
GFR calc Af Amer: 56 mL/min/{1.73_m2} — ABNORMAL LOW (ref >59)
GFR calc non Af Amer: 48 mL/min/{1.73_m2} — ABNORMAL LOW (ref >59)
Glucose: 89 mg/dL (ref 65–99)
Potassium: 5.2 mmol/L (ref 3.5–5.2)
Sodium: 142 mmol/L (ref 134–144)

## 2019-06-28 NOTE — Telephone Encounter (Signed)
THe medicine I was thinking of requip is not compatible with Warfarin so I did not order

## 2019-06-28 NOTE — Telephone Encounter (Signed)
Notified. 

## 2019-06-29 ENCOUNTER — Other Ambulatory Visit: Payer: Self-pay

## 2019-06-29 LAB — DRUG TOX MONITOR 1 W/CONF, ORAL FLD

## 2019-06-29 LAB — DRUG TOX ALC METAB W/CON, ORAL FLD: Alcohol Metabolite: NEGATIVE ng/mL (ref <25)

## 2019-07-02 ENCOUNTER — Telehealth: Payer: Self-pay | Admitting: *Deleted

## 2019-07-02 ENCOUNTER — Ambulatory Visit (INDEPENDENT_AMBULATORY_CARE_PROVIDER_SITE_OTHER): Payer: Medicare Other | Admitting: General Practice

## 2019-07-02 ENCOUNTER — Other Ambulatory Visit: Payer: Self-pay

## 2019-07-02 DIAGNOSIS — Z7901 Long term (current) use of anticoagulants: Secondary | ICD-10-CM

## 2019-07-02 LAB — POCT INR: INR: 1.6 — AB (ref 2.0–3.0)

## 2019-07-02 NOTE — Patient Instructions (Signed)
Pre visit review using our clinic review tool, if applicable. No additional management support is needed unless otherwise documented below in the visit note.  Take 2 tablets today (5/3) and then change dosage to 1 (3 mg) tablet daily except 1/2 tablet (1.5 mg) on Saturdays only.  Re-check in 3 weeks.

## 2019-07-02 NOTE — Telephone Encounter (Signed)
Oral swab drug screen was consistent for prescribed medications.  ?

## 2019-07-13 ENCOUNTER — Other Ambulatory Visit: Payer: Self-pay

## 2019-07-16 ENCOUNTER — Ambulatory Visit: Payer: Medicare Other

## 2019-07-17 ENCOUNTER — Other Ambulatory Visit: Payer: Self-pay

## 2019-07-18 ENCOUNTER — Ambulatory Visit (INDEPENDENT_AMBULATORY_CARE_PROVIDER_SITE_OTHER): Payer: Medicare Other | Admitting: General Practice

## 2019-07-18 DIAGNOSIS — Z7901 Long term (current) use of anticoagulants: Secondary | ICD-10-CM

## 2019-07-18 LAB — POCT INR: INR: 2.5 (ref 2.0–3.0)

## 2019-07-18 NOTE — Patient Instructions (Signed)
Pre visit review using our clinic review tool, if applicable. No additional management support is needed unless otherwise documented below in the visit note.  Continue to take  1 (3 mg) tablet daily except 1/2 tablet (1.5 mg) on Saturdays only.  Re-check in 4 weeks.

## 2019-07-19 ENCOUNTER — Other Ambulatory Visit: Payer: Self-pay

## 2019-07-19 ENCOUNTER — Other Ambulatory Visit: Payer: Self-pay | Admitting: Internal Medicine

## 2019-07-19 MED ORDER — CARVEDILOL 25 MG PO TABS: 25.0000 mg | ORAL_TABLET | Freq: Two times a day (BID) | ORAL | 0 refills | Status: DC

## 2019-07-20 ENCOUNTER — Other Ambulatory Visit: Payer: Self-pay | Admitting: *Deleted

## 2019-07-20 DIAGNOSIS — I480 Paroxysmal atrial fibrillation: Secondary | ICD-10-CM

## 2019-07-20 DIAGNOSIS — I10 Essential (primary) hypertension: Secondary | ICD-10-CM

## 2019-07-20 NOTE — Telephone Encounter (Signed)
Referral to CCM placed as requested.

## 2019-07-23 ENCOUNTER — Other Ambulatory Visit: Payer: Self-pay

## 2019-07-23 ENCOUNTER — Ambulatory Visit: Payer: Medicare Other

## 2019-07-23 DIAGNOSIS — I480 Paroxysmal atrial fibrillation: Secondary | ICD-10-CM

## 2019-07-23 DIAGNOSIS — I10 Essential (primary) hypertension: Secondary | ICD-10-CM

## 2019-07-23 DIAGNOSIS — G4700 Insomnia, unspecified: Secondary | ICD-10-CM

## 2019-07-23 DIAGNOSIS — I5022 Chronic systolic (congestive) heart failure: Secondary | ICD-10-CM

## 2019-07-23 DIAGNOSIS — E785 Hyperlipidemia, unspecified: Secondary | ICD-10-CM

## 2019-07-23 NOTE — Chronic Care Management (AMB) (Signed)
Chronic Care Management Pharmacy  Name: Kelly-Anne Filbeck  MRN: [id number] DOB: Jul 25, 1945  Initial Questions: 1. Have you seen any other providers since your last visit? NA 2. Any changes in your medicines or health? No   Chief Complaint/ HPI Kyleigh Macmurray,  74 y.o. , female presents for their Initial CCM visit with the clinical pharmacist via telephone due to COVID-19 Pandemic.  PCP : [name], [name], MD  Their chronic conditions include: Afib, HF, HTN, HLD, aortic stenosis/ aortic atherosclerosis, COPD/ asthma, Osteoporosis, Chronic pain disorder, Hyperthyroidism, Breast cancer, Insomnia, CKD, stage III   Office Visits: 07/18/19- [name], RN- patient presented for anticoagulation visit. INR: 2.5. Patient to continue dose: 1.5mg  every Saturday and 3mg  all other days. Next INR: 08/15/2019.   01/29/2019- [name], MD- patient presented for routine physical and follow up. Patient to continue Calcium and vitamin supplementation and follow up for next CPE in a year.   Consult Visit:  4/14/20210 Cardiology- [name], MD- Patient presented for office visit for follow up for heart failure and valvular heart disease. Patient appeared euvolemic on exam and encouraged increasing her activity. Patient to try Entresto 49/51mg  twice daily and monitor potassium. Patient to obtain BMP in 2 weeks. No other medication changes. Patient to return in 4 weeks.   05/21/2019- Oncology- [name], MD- patient presented for follow up. Patient reports doing well. Patient to continue Tamoxifen for 5 to 10 years. Next mammogram in 11/2019. Recommended OTC Benadryl or melatonin for insomnia as well as sleep hygiene. Follow up in 6 months.   03/12/2019- Cardiology- [name], MD- patient presented for office visit for follow up for heart failure. Patient endorsed NYHA class II to III symptoms. Losartan switched to Entresto 49/51mg  twice daily. Spironolactone deferred due to  borderline hyperkalemia. Patient to follow up in 3 months.   12/26/2018- Physical medicine and Rehabilitation- [name], MD - patient presented for office visit for polyarthralgia.  Patient to undergo PT for bilaterl knee pain. If no pain alleviation, recommend nerve blocks. Patient given tramadol 50mg  TID. Patient to call in 1 months if not better.   Medications: Outpatient Encounter Medications as of 07/23/2019  Medication Sig  . acetaminophen (TYLENOL) 500 MG tablet Take 1,000 mg by mouth daily as needed (PAIN).  Marland Kitchen albuterol (PROVENTIL HFA;VENTOLIN HFA) 108 (90 Base) MCG/ACT inhaler Inhale 2 puffs into the lungs every 6 (six) hours as needed for wheezing or shortness of breath.   Marland Kitchen aspirin EC 81 MG tablet Take 81 mg daily by mouth.  . budesonide-formoterol (SYMBICORT) 160-4.5 MCG/ACT inhaler Inhale 2 puffs into the lungs 2 (two) times daily.  . Cholecalciferol (VITAMIN D3) 400 units tablet Take 1 tablet (400 Units total) by mouth daily. (Patient taking differently: Take 1,000 Units by mouth daily. )  . diclofenac sodium (VOLTAREN) 1 % GEL Apply 4 g topically 4 (four) times daily.  . diphenhydramine-acetaminophen (TYLENOL PM) 25-500 MG TABS tablet Take 2 tablets by mouth at bedtime.   . furosemide (LASIX) 20 MG tablet Take 1 tablet (20 mg total) by mouth daily. (Patient taking differently: Take 20 mg by mouth every other day. )  . MELATONIN PO Take 1 tablet by mouth at bedtime.  . senna-docusate (SENOKOT-S) 8.6-50 MG tablet Take 2 tablets at bedtime by mouth.   . tamoxifen (NOLVADEX) 20 MG tablet Take 1 tablet by mouth once daily  . traMADol (ULTRAM) 50 MG tablet Take 1 tablet (50 mg total) by mouth 3 (three) times daily.  Marland Kitchen trolamine  salicylate (ASPERCREME) 10 % cream Apply 1 application topically as needed for muscle pain.  Marland Kitchen warfarin (COUMADIN) 3 MG tablet TAKE AS DIRECTED BY ANTICOAGULATION CLINIC.  . [DISCONTINUED] alendronate (FOSAMAX) 70 MG tablet Take 1 tablet (70 mg total) by  mouth once a week. Take with a full glass of water on an empty stomach.  . [DISCONTINUED] carvedilol (COREG) 25 MG tablet Take 1 tablet (25 mg total) by mouth 2 (two) times daily.  . [DISCONTINUED] methimazole (TAPAZOLE) 5 MG tablet TAKE 3 TABLETS BY MOUTH EVERY EVENING. FOLLOW UP APPT IS NEEDED BEFORE MORE REFILLS ARE AUTHORIZED.  . [DISCONTINUED] rosuvastatin (CRESTOR) 40 MG tablet Take 1 tablet by mouth once daily  . [DISCONTINUED] sacubitril-valsartan (ENTRESTO) 49-51 MG Take 1 tablet by mouth 2 (two) times daily.  . [DISCONTINUED] warfarin (COUMADIN) 1 MG tablet 1 tab wednesdays (Patient taking differently: As directed by anticoagulation clinic)  . valACYclovir (VALTREX) 500 MG tablet Take 1 tablet (500 mg total) by mouth 2 (two) times daily. For 3 days when outbreaks.   No facility-administered encounter medications on file as of 07/23/2019.     Current Diagnosis/Assessment:  Goals Addressed            This Visit's Progress   . Pharmacy Care Plan       CARE PLAN ENTRY  Current Barriers:  . Chronic Disease Management support, education, and care coordination needs related to Hypertension, Hyperlipidemia, Atrial Fibrillation, Heart Failure, and Insomnia   Hypertension . Pharmacist Clinical Goal(s): o Over the next 180 days, patient will work with PharmD and providers to maintain BP goal <130/80 . Current regimen:  o Carvedilol 25mg , 1 tablet twice daily . Patient self care activities - Over the next 180 days, patient will: o Check BP as directed document, and provide at future appointments  Hyperlipidemia . Pharmacist Clinical Goal(s): o Over the next 180 days, patient will work with PharmD and providers to maintain LDL goal < 70 . Current regimen:  o Rosuvastatin 40mg , 1 tablet once daily  . Interventions: o Recommend addition of ezetimibe 10mg  to target LDL goal.  . Patient self care activities - Over the next 180 days, patient will: o Recommend additional therapy to  target LDL goal <70.   Atrial fibrillation  . Pharmacist Clinical Goal(s): o Over the next 180 days, patient will work with PharmD and providers to maintain normal sinus rhythm and prevent stroke.  . Current regimen:  o Carvedilol 25mg , 1 tablet twice daily   For blood clot prevention:   Warfarin 1.5mg  every Saturday.   Warfarin 3mg ,1 tablet once a day (except Saturday) . Interventions: o We discussed:  monitoring for signs and symptoms for bleeding (coughing up blood, prolonged nose bleeds, black, tarry stools). . Patient self care activities - Over the next 180 days, patient will: o Continue follow up visits for INR checks.  o Contact provider with any episodes of bleeding.   Heart failure  . Pharmacist Clinical Goal(s) o Over the next 180 days, patient will work with PharmD and providers to prevent hospitalizations.  . Current regimen:   Carvedilol 25mg , 1 tablet twice daily  sacubitril- valsartan (Entresto) 49-51mg , 1 tablet twice daily   Furosemide 20mg , 1 tablet once daily  . Interventions: o We discussed weighing daily; if you gain more than 3 pounds in one day or 5 pounds in one week call your doctor  . Patient self care activities - Over the next 180 days, patient will: o Continue current medications and monitor  fluid retention (weighing daily).   Insomnia  . Pharmacist Clinical Goal(s) o Over the next 180 days, patient will work with PharmD and providers to improve sleep . Current regimen:   Diphenhydramine- APAP 25-500mg , 2 tablet at bedtime   Melatonin 10mg , 1 tablet at bedtime . Interventions: o We discussed:  non-pharmacological interventions for insomnia. (Avoid napping, limit exposure to technology near bedtime, etc)  o We discussed: maximum daily dose of acetaminophen (3000 to 4000mg  per day).  o Consider extended release melatonin to help maintain sleep.  . Patient self care activities o Patient will continue current medications and improve sleep  hygiene.   Medication management . Pharmacist Clinical Goal(s): o Over the next 180 days, patient will work with PharmD and providers to maintain optimal medication adherence . Current pharmacy: [facility] . Interventions o Comprehensive medication review performed. o Continue current medication management strategy . Patient self care activities - Over the next 180 days, patient will: o Take medications as prescribed o Report any questions or concerns to PharmD and/or provider(s)  Initial goal documentation       SDOH Interventions     Most Recent Value  SDOH Interventions  Financial Strain Interventions Intervention Not Indicated  Transportation Interventions Intervention Not Indicated       AFIB   Patient is currently rate controlled. HR: 73- 97 BPM   Patient has failed these meds in past: none   Patient is currently controlled on the following medications:   Carvedilol 25mg , 1 tablet twice daily   Anticoagulation:   Warfarin 1.5mg  every Saturday.   Warfarin 3mg ,1 tablet once a day (except Saturday)  INR  Date Value Ref Range Status  09/17/2019 2.0 2.0 - 3.0 Final   We discussed:  monitoring for signs and symptoms for bleeding (coughing up blood, prolonged nose bleeds, black, tarry stools).  Plan Continue current medications  Heart Failure  Denies SOB.  Reports only when walking.  Currently taking furosemide daily due to slight swelling present.   Type: Systolic  Last ejection fraction: 35-40% (01/18/2019)   NYHA Class: II (slight limitation of activity) AHA HF Stage: C (Heart disease and symptoms present)  Patient has failed these meds in past: losartan   Patient is currently controlled on the following medications:   Carvedilol 25mg , 1 tablet twice daily  sacubitril- valsartan (Entresto) 49-51mg , 1 tablet twice daily   Furosemide 20mg , 1 tablet once daily (patient reports taking every other day- will take daily if swelling is present)      We discussed weighing daily; if you gain more than 3 pounds in one day or 5 pounds in one week call your doctor   Plan Continue current medications  Hypertension  Patient reports BP has stablized since being on Entresto for 1 month now.   Denies dizziness/ lightheadedness   BP today is:  <130/80  Office blood pressures are  BP Readings from Last 3 Encounters:  08/24/19 (!) 144/89  07/24/19 138/82  06/26/19 96/71   Patient has failed these meds in the past: none   Patient checks BP at home daily  Patient home BP readings are ranging: 94/56 (today);  patient reports other days her BP is higher than that. 124/84  136/86  Patient is controlled on:   Carvedilol 25mg , 1 tablet twice daily  Plan Continue current medications   Hyperlipidemia/ aortic stenosis/ aortic artherosclerosis    Lipid Panel     Component Value Date/Time   CHOL 161 01/29/2019 1232   TRIG 77.0  01/29/2019 1232   HDL 64.90 01/29/2019 1232   CHOLHDL 2 01/29/2019 1232   VLDL 15.4 01/29/2019 1232   LDLCALC 81 01/29/2019 1232    The ASCVD Risk score (Goff DC Jr., et al., 2013) failed to calculate for the following reasons:   The patient has a prior MI or stroke diagnosis   Patient has failed these meds in past: none   Patient is currently uncontrolled on the following medications:   Rosuvastatin 40mg , 1 tablet once daily   Aspirin 81mg , 1 tablet once daily   Plan Recommend additional lipid therapy to target LDL goal <70 (ezetimibe).   COPD / Asthma    Last spirometry score:  03/11/2016 FEV1: 89% FEV1/ FVC: 94%  Gold Grade: Gold 1 (FEV1>80%)  Eosinophil count:   Lab Results  Component Value Date/Time   EOSPCT 8 05/21/2019 09:40 AM   EOSPCT 4.3 12/22/2016 08:50 AM  %                               Eos (Absolute):  Lab Results  Component Value Date/Time   EOSABS 0.4 05/21/2019 09:40 AM   EOSABS 0.2 12/22/2016 08:50 AM   EOSABS 0.2 04/08/2016 10:56 AM    Tobacco Status:  Social  History   Tobacco Use  Smoking Status Former Smoker  . Packs/day: 0.25  . Years: 10.00  . Pack years: 2.50  . Types: Cigarettes  . Quit date: 03/01/2010  . Years since quitting: 9.6  Smokeless Tobacco Never Used   Patient has failed these meds in past: none  Patient is currently controlled on the following medications:   Albuterol HFA inhaler, 2 puffs every six hours as needed for whezing or shortness of breath   Budesonide/ formoterol (Symbicort) 160-4.80mcg/ act inhaler, 2 puffs twice daily   Using maintenance inhaler regularly? Yes- - patient reports using before exercise - and for most part uses twice daily   Frequency of rescue inhaler use:  prn  - rescue inhaler- patient reports use "not in awhile"    We discussed:  - proper inhaler technique  - reminded rinsing mouth after each use of budesonide/ formoterol inhaler   Plan Continue current medications  Osteoporosis   Last DEXA Scan: 11/29/2018  T-Score lumbar spine: excluded due to advanced degenerative changes  T-Score forearm radius: -1.7 (left forearm)   T-Score femur: -2.8 (right femur)   VITD  Date Value Ref Range Status  01/29/2019 39.12 30.00 - 100.00 ng/mL Final    Patient is a candidate for pharmacologic treatment due to T-Score < -2.5 in right femur and history of hip fracture.   Patient has failed these meds in past: none   Patient is currently managed on the following medications:   Alendronate 70mg , 1 tablet once a week. (Saturday)   Vitamin D 1000 units, 1 tablet once dailiy   Currently not taking calcium   We discussed:  Recommend 1200 mg of calcium daily from dietary and supplemental sources. Counseled on oral bisphosphonate administration: take in the morning, 30 minutes prior to food with 6-8 oz of water. Do not lie down for at least 30 minutes after taking.  Plan Continue current medications  Chronic pain disorder    Patient is currently controlled on the following medications:    Diclofenac 1%gel, apply 4g four times daily   Aspercreme- as needed   APAP 500mg , 2 tablet daily as needed for pain   Tramadol 50mg , 1  tablet three times daily   We discussed: The maximum daily dose of acetaminophen was discussed with the patient. She was encouraged not to exceed 4,000 mg of acetaminophen during a 24 hour period and was asked to keep in mind that acetaminophen can also be found in many over-the-counter cold medications as well as narcotics that may be given for pain. The patient expresses understanding of these issues and questions were answered.  Plan Continue current medications  Hyperthyroidism   TSH  Date Value Ref Range Status  01/29/2019 0.55 0.35 - 4.50 uIU/mL Final    Patient is currently controlled on the following medications:   Methimazole 5mg , 3 tablet every evening  Plan Continue current medications  Breast cancer   Patient reported undergoing 4 weeks of daily chemotherapy and currently only being monitored. She states she was told by oncologist that everything including lymph nodes were looking fine.   Patient is currently controlled on the following medications:   Tamoxifen 20mg , 1 tablet once daily   Plan Managed by Dr. Margit Banda (oncology)  Continue current medications  Insomnia   Patient reports getting at most 3 to 4 hours of sleep. She reports difficulty staying asleep and waking up around 2:30 am.   Patient is currently controlled on the following medications:   Diphenhydramine- APAP 25-500mg , 2 tablet at bedtime   Melatonin 10mg , 1 tablet at bedtime   We discussed:  non-pharmacological interventions for insomnia. (Avoid napping, limit exposure to technology near bedtime, etc)  The maximum daily dose of acetaminophen was discussed with the patient. She was encouraged not to exceed 4,000 mg of acetaminophen during a 24 hour period and was asked to keep in mind that acetaminophen can also be found in many over-the-counter cold  medications as well as narcotics that may be given for pain. The patient expresses understanding of these issues and questions were answered.   Plan Recommend melatonin extended release.  Continue current medications  CKD, Stage III    Kidney Function Lab Results  Component Value Date/Time   CREATININE 1.13 (H) 06/28/2019 09:09 AM   CREATININE 1.15 (H) 05/21/2019 09:40 AM   CREATININE 1.09 (H) 04/10/2019 10:04 AM   CREATININE 1.20 (H) 11/20/2018 08:58 AM   CREATININE 1.2 (H) 12/22/2016 08:50 AM   GFR 57.35 (L) 01/04/2018 12:32 PM   GFRNONAA 48 (L) 06/28/2019 09:09 AM   GFRNONAA 47 (L) 05/21/2019 09:40 AM   GFRAA 56 (L) 06/28/2019 09:09 AM   GFRAA 55 (L) 05/21/2019 09:40 AM   Kidney function stable.   Plan Continue to monitor and adjust medications as needed.    Medication Management  Patient organizes medications: patient uses a weekly container (AM/PM)  Primary pharmacy: [facility] (process of using [facility] mail order)  Adherence:  patient reports not forgetting doses.  no gaps in refill history (per medication dispense history from 01/24/2019 to 07/23/2019)   Follow up Follow up visit with PharmD in October. Follow up on calcium intake and multivitamin with no interactions with warfarin.  Recommend additional lipid therapy to target LDL goal <70 (ezetimibe).    Anson Crofts, PharmD Clinical Pharmacist [facility] [place] [phone]

## 2019-07-23 NOTE — Patient Instructions (Addendum)
Visit Information  Goals Addressed            This Visit's Progress   . Pharmacy Care Plan       CARE PLAN ENTRY  Current Barriers:  . Chronic Disease Management support, education, and care coordination needs related to Hypertension, Hyperlipidemia, Atrial Fibrillation, Heart Failure, and Insomnia   Hypertension . Pharmacist Clinical Goal(s): o Over the next 180 days, patient will work with PharmD and providers to maintain BP goal <130/80 . Current regimen:  o Carvedilol 25mg , 1 tablet twice daily . Patient self care activities - Over the next 180 days, patient will: o Check BP as directed document, and provide at future appointments  Hyperlipidemia . Pharmacist Clinical Goal(s): o Over the next 180 days, patient will work with PharmD and providers to maintain LDL goal < 70 . Current regimen:  o Rosuvastatin 40mg , 1 tablet once daily  . Interventions: o Recommend addition of ezetimibe 10mg  to target LDL goal.  . Patient self care activities - Over the next 180 days, patient will: o Recommend additional therapy to target LDL goal <70.   Atrial fibrillation  . Pharmacist Clinical Goal(s): o Over the next 180 days, patient will work with PharmD and providers to maintain normal sinus rhythm and prevent stroke.  . Current regimen:  o Carvedilol 25mg , 1 tablet twice daily   For blood clot prevention:   Warfarin 1.5mg  every Saturday.   Warfarin 3mg ,1 tablet once a day (except Saturday) . Interventions: o We discussed:  monitoring for signs and symptoms for bleeding (coughing up blood, prolonged nose bleeds, black, tarry stools). . Patient self care activities - Over the next 180 days, patient will: o Continue follow up visits for INR checks.  o Contact provider with any episodes of bleeding.   Heart failure  . Pharmacist Clinical Goal(s) o Over the next 180 days, patient will work with PharmD and providers to prevent hospitalizations.  . Current regimen:   Carvedilol  25mg , 1 tablet twice daily  sacubitril- valsartan (Entresto) 49-51mg , 1 tablet twice daily   Furosemide 20mg , 1 tablet once daily  . Interventions: o We discussed weighing daily; if you gain more than 3 pounds in one day or 5 pounds in one week call your doctor  . Patient self care activities - Over the next 180 days, patient will: o Continue current medications and monitor fluid retention (weighing daily).   Insomnia  . Pharmacist Clinical Goal(s) o Over the next 180 days, patient will work with PharmD and providers to improve sleep . Current regimen:   Diphenhydramine- APAP 25-500mg , 2 tablet at bedtime   Melatonin 10mg , 1 tablet at bedtime . Interventions: o We discussed:  non-pharmacological interventions for insomnia. (Avoid napping, limit exposure to technology near bedtime, etc)  o We discussed: maximum daily dose of acetaminophen (3000 to 4000mg  per day).  o Consider extended release melatonin to help maintain sleep.  . Patient self care activities o Patient will continue current medications and improve sleep hygiene.   Medication management . Pharmacist Clinical Goal(s): o Over the next 180 days, patient will work with PharmD and providers to maintain optimal medication adherence . Current pharmacy: [facility] . Interventions o Comprehensive medication review performed. o Continue current medication management strategy . Patient self care activities - Over the next 180 days, patient will: o Take medications as prescribed o Report any questions or concerns to PharmD and/or provider(s)  Initial goal documentation        [name]  was given information about Chronic Care Management services today including:  1. CCM service includes personalized support from designated clinical staff supervised by her physician, including individualized plan of care and coordination with other care providers 2. 24/7 contact phone numbers for assistance for urgent and  routine care needs. 3. Standard insurance, coinsurance, copays and deductibles apply for chronic care management only during months in which we provide at least 20 minutes of these services. Most insurances cover these services at 100%, however patients may be responsible for any copay, coinsurance and/or deductible if applicable. This service may help you avoid the need for more expensive face-to-face services. 4. Only one practitioner may furnish and bill the service in a calendar month. 5. The patient may stop CCM services at any time (effective at the end of the month) by phone call to the office staff.  Patient agreed to services and verbal consent obtained.   The patient verbalized understanding of instructions provided today and agreed to receive a mailed copy of patient instruction and/or educational materials. Telephone follow up appointment with pharmacy team member scheduled for:  12/24/2019  Anson Crofts, PharmD Clinical Pharmacist [facility] [place] [phone]

## 2019-07-24 ENCOUNTER — Encounter: Payer: Self-pay | Admitting: Physical Medicine & Rehabilitation

## 2019-07-24 ENCOUNTER — Other Ambulatory Visit: Payer: Self-pay | Admitting: *Deleted

## 2019-07-24 ENCOUNTER — Encounter: Payer: Medicare Other | Attending: Physical Medicine & Rehabilitation | Admitting: Physical Medicine & Rehabilitation

## 2019-07-24 ENCOUNTER — Other Ambulatory Visit: Payer: Self-pay

## 2019-07-24 VITALS — BP 138/82 | HR 77 | Temp 97.0°F | Ht 67.0 in | Wt 257.0 lb

## 2019-07-24 DIAGNOSIS — M810 Age-related osteoporosis without current pathological fracture: Secondary | ICD-10-CM

## 2019-07-24 DIAGNOSIS — M25562 Pain in left knee: Secondary | ICD-10-CM | POA: Diagnosis not present

## 2019-07-24 DIAGNOSIS — G8929 Other chronic pain: Secondary | ICD-10-CM | POA: Insufficient documentation

## 2019-07-24 DIAGNOSIS — M159 Polyosteoarthritis, unspecified: Secondary | ICD-10-CM | POA: Diagnosis not present

## 2019-07-24 DIAGNOSIS — M48061 Spinal stenosis, lumbar region without neurogenic claudication: Secondary | ICD-10-CM | POA: Diagnosis not present

## 2019-07-24 DIAGNOSIS — M25561 Pain in right knee: Secondary | ICD-10-CM | POA: Insufficient documentation

## 2019-07-24 DIAGNOSIS — M47816 Spondylosis without myelopathy or radiculopathy, lumbar region: Secondary | ICD-10-CM | POA: Diagnosis not present

## 2019-07-24 DIAGNOSIS — M545 Low back pain: Secondary | ICD-10-CM | POA: Diagnosis not present

## 2019-07-24 DIAGNOSIS — G894 Chronic pain syndrome: Secondary | ICD-10-CM | POA: Diagnosis not present

## 2019-07-24 DIAGNOSIS — E0590 Thyrotoxicosis, unspecified without thyrotoxic crisis or storm: Secondary | ICD-10-CM

## 2019-07-24 MED ORDER — ALENDRONATE SODIUM 70 MG PO TABS: 70.0000 mg | ORAL_TABLET | ORAL | 3 refills | Status: DC

## 2019-07-24 MED ORDER — METHIMAZOLE 5 MG PO TABS: ORAL_TABLET | ORAL | 3 refills | Status: DC

## 2019-07-24 MED ORDER — ROSUVASTATIN CALCIUM 40 MG PO TABS: 40.0000 mg | ORAL_TABLET | Freq: Every day | ORAL | 0 refills | Status: DC

## 2019-07-24 NOTE — Progress Notes (Signed)
Bilateral Lumbar L3, L4  medial branch blocks and L 5 dorsal ramus injection under fluoroscopic guidance  Indication: Lumbar pain which is not relieved by medication management or other conservative care and interfering with self-care and mobility.  Informed consent was obtained after describing risks and benefits of the procedure with the patient, this includes bleeding, infection, paralysis and medication side effects.  The patient wishes to proceed and has given written consent.  The patient was placed in prone position.  The lumbar area was marked and prepped with Betadine.  One mL of 1% lidocaine was injected into each of 6 areas into the skin and subcutaneous tissue.  Then a 22-gauge 5in spinal needle was inserted targeting the junction of the left S1 superior articular process and sacral ala junction. Needle was advanced under fluoroscopic guidance.  Bone contact was made.  Isovue 200 was injected x 0.5 mL demonstrating no intravascular uptake.  Then a solution  of 2% MPF lidocaine was injected x 0.5 mL.  Then the left L5 superior articular process in transverse process junction was targeted.  Bone contact was made.  Isovue 200 was injected x 0.5 mL demonstrating no intravascular uptake. Then a solution containing  2% MPF lidocaine was injected x 0.5 mL.  Then the left L4 superior articular process in transverse process junction was targeted.  Bone contact was made.  Isovue 200 was injected x 0.5 mL demonstrating no intravascular uptake.  Then a solution containing2% MPF lidocaine was injected x 0.5 mL.  This same procedure was performed on the right side using the same needle, technique and injectate.  Patient tolerated procedure well.  Post procedure instructions were given. Pre injection 5/10 Post injection 0/10

## 2019-07-24 NOTE — Progress Notes (Addendum)
  [facility]   Name: Lanese Graeber DOB:12-02-1945 MRN: [id number]  Date:07/24/2019  Physician: [name], MD    Nurse/CMA: Truman Hayward CMA  Allergies:  Allergies  Allergen Reactions  . Lisinopril Cough  . Tetanus Toxoid Adsorbed Swelling    Arm swelling  . Other Cough    PEANUTS    Consent Signed: Yes.    Is patient diabetic? No.  CBG today?   Pregnant: No. LMP: No LMP recorded. Patient is postmenopausal. (age 74-55)  Anticoagulants: yes (Coumadin) Anti-inflammatory: no Antibiotics: no  Procedure:Bilateral L3-4-5 medial branch blocks  Position: Prone Start Time: 3:50[m End Time:4:02pm Fluoro Time: 75  RN/CMA Jalayla Chrismer RN Lee CMA    Time 2:41 4:08pm    BP 138/82 155/82    Pulse 77 94    Respirations 14 14    O2 Sat 96 94    S/S 6 6    Pain Level 5/10 0/10     D/C home with husband, [name] & O X 3, D/C instructions reviewed, and sits independently.

## 2019-07-24 NOTE — Patient Instructions (Signed)

## 2019-08-15 ENCOUNTER — Ambulatory Visit: Payer: Medicare Other

## 2019-08-17 ENCOUNTER — Other Ambulatory Visit: Payer: Self-pay

## 2019-08-20 ENCOUNTER — Ambulatory Visit (INDEPENDENT_AMBULATORY_CARE_PROVIDER_SITE_OTHER): Payer: Medicare Other | Admitting: General Practice

## 2019-08-20 DIAGNOSIS — Z7901 Long term (current) use of anticoagulants: Secondary | ICD-10-CM | POA: Diagnosis not present

## 2019-08-20 LAB — POCT INR: INR: 3.7 — AB (ref 2.0–3.0)

## 2019-08-20 NOTE — Patient Instructions (Addendum)
Pre visit review using our clinic review tool, if applicable. No additional management support is needed unless otherwise documented below in the visit note.  Hold coumadin today and then continue to take  1 (3 mg) tablet daily except 1/2 tablet (1.5 mg) on Saturdays only.  Re-check in 3 to 4 weeks.  Add 2 to 3 servings of greens per week.

## 2019-08-24 ENCOUNTER — Other Ambulatory Visit: Payer: Self-pay

## 2019-08-24 ENCOUNTER — Encounter: Payer: Medicare Other | Attending: Physical Medicine & Rehabilitation | Admitting: Physical Medicine & Rehabilitation

## 2019-08-24 ENCOUNTER — Encounter: Payer: Self-pay | Admitting: Physical Medicine & Rehabilitation

## 2019-08-24 VITALS — BP 144/89 | HR 78 | Temp 96.5°F | Ht 67.0 in | Wt 256.8 lb

## 2019-08-24 DIAGNOSIS — M159 Polyosteoarthritis, unspecified: Secondary | ICD-10-CM | POA: Insufficient documentation

## 2019-08-24 DIAGNOSIS — G8929 Other chronic pain: Secondary | ICD-10-CM | POA: Insufficient documentation

## 2019-08-24 DIAGNOSIS — M48061 Spinal stenosis, lumbar region without neurogenic claudication: Secondary | ICD-10-CM | POA: Insufficient documentation

## 2019-08-24 DIAGNOSIS — M25562 Pain in left knee: Secondary | ICD-10-CM | POA: Diagnosis not present

## 2019-08-24 DIAGNOSIS — M25561 Pain in right knee: Secondary | ICD-10-CM | POA: Diagnosis not present

## 2019-08-24 DIAGNOSIS — G894 Chronic pain syndrome: Secondary | ICD-10-CM | POA: Diagnosis not present

## 2019-08-24 DIAGNOSIS — M545 Low back pain: Secondary | ICD-10-CM | POA: Diagnosis not present

## 2019-08-24 DIAGNOSIS — M47816 Spondylosis without myelopathy or radiculopathy, lumbar region: Secondary | ICD-10-CM | POA: Diagnosis not present

## 2019-08-24 MED ORDER — DIAZEPAM 5 MG PO TABS: 5.0000 mg | ORAL_TABLET | Freq: Once | ORAL | 1 refills | Status: AC

## 2019-08-24 NOTE — Progress Notes (Signed)
  PROCEDURE RECORD [facility]   Name: Danielle [name] DOB:[birth year] MRN: [id number]  Date:08/24/2019  Physician: [name], MD    Nurse/CMA: Wessling, CMA  Allergies:  Allergies  Allergen Reactions  . Lisinopril Cough  . Tetanus Toxoid Adsorbed Swelling    Arm swelling  . Other Cough    PEANUTS    Consent Signed: Yes.    Is patient diabetic? No.  CBG today?   Pregnant: No. LMP: No LMP recorded. Patient is postmenopausal. (age [age])  Anticoagulants: yes (coumadin) Anti-inflammatory: no Antibiotics: no  Procedure: bilateral L3,4,5 medial branch block Position: Prone Start Time: 11:50am  End Time: 12:03pm  Fluoro Time: 65s  RN/CMA Armen Waring RN Wesslling CMA    Time 11:31 12:10pm    BP 144/89 131/79    Pulse 78 84    Respirations 14 14    O2 Sat 91 93    S/S 6 6    Pain Level 8/10 0/10     D/C home with husband, [name] & O X 3, D/C instructions reviewed, and sits independently.

## 2019-08-24 NOTE — Patient Instructions (Signed)

## 2019-08-24 NOTE — Progress Notes (Signed)
Bilateral Lumbar L3, L4  medial branch blocks and L 5 dorsal ramus injection under fluoroscopic guidance  Indication: Lumbar pain which is not relieved by medication management or other conservative care and interfering with self-care and mobility.  Informed consent was obtained after describing risks and benefits of the procedure with the patient, this includes bleeding, infection, paralysis and medication side effects.  The patient wishes to proceed and has given written consent.  The patient was placed in prone position.  The lumbar area was marked and prepped with Betadine.  One mL of 1% lidocaine was injected into each of 6 areas into the skin and subcutaneous tissue.  Then a 22-gauge 5in spinal needle was inserted targeting the junction of the left S1 superior articular process and sacral ala junction. Needle was advanced under fluoroscopic guidance.  Bone contact was made.  Isovue 200 was injected x 0.5 mL demonstrating no intravascular uptake.  Then a solution  of 2% MPF lidocaine was injected x 0.5 mL.  Then the left L5 superior articular process in transverse process junction was targeted.  Bone contact was made.  Isovue 200 was injected x 0.5 mL demonstrating no intravascular uptake. Then a solution containing  2% MPF lidocaine was injected x 0.5 mL.  Then the left L4 superior articular process in transverse process junction was targeted.  Bone contact was made.  Isovue 200 was injected x 0.5 mL demonstrating no intravascular uptake.  Then a solution containing2% MPF lidocaine was injected x 0.5 mL.  This same procedure was performed on the right side using the same needle, technique and injectate.  Patient tolerated procedure well.  Post procedure instructions were given. Pre injection 8/10 Post injection 0/10 Schedule for RIght L3-4-5 RFA next month, Left L3-4-5 RFA the following month

## 2019-08-27 ENCOUNTER — Other Ambulatory Visit: Payer: Self-pay

## 2019-08-27 MED ORDER — CARVEDILOL 25 MG PO TABS: 25.0000 mg | ORAL_TABLET | Freq: Two times a day (BID) | ORAL | 0 refills | Status: DC

## 2019-08-27 MED ORDER — ENTRESTO 49-51 MG PO TABS: 1.0000 | ORAL_TABLET | Freq: Two times a day (BID) | ORAL | 0 refills | Status: DC

## 2019-08-27 MED ORDER — ROSUVASTATIN CALCIUM 40 MG PO TABS: 40.0000 mg | ORAL_TABLET | Freq: Every day | ORAL | 1 refills | Status: DC

## 2019-09-05 ENCOUNTER — Other Ambulatory Visit: Payer: Self-pay | Admitting: Family Medicine

## 2019-09-17 ENCOUNTER — Ambulatory Visit (INDEPENDENT_AMBULATORY_CARE_PROVIDER_SITE_OTHER): Payer: Medicare Other | Admitting: General Practice

## 2019-09-17 ENCOUNTER — Other Ambulatory Visit: Payer: Self-pay

## 2019-09-17 DIAGNOSIS — Z7901 Long term (current) use of anticoagulants: Secondary | ICD-10-CM | POA: Diagnosis not present

## 2019-09-17 LAB — POCT INR: INR: 2 (ref 2.0–3.0)

## 2019-09-17 NOTE — Patient Instructions (Signed)
Pre visit review using our clinic review tool, if applicable. No additional management support is needed unless otherwise documented below in the visit note.  Take 1 1/2 tablets today (7/19) and then continue to take  1 (3 mg) tablet daily except 1/2 tablet (1.5 mg) on Saturdays only.  Re-check in 4 weeks.

## 2019-09-21 ENCOUNTER — Encounter: Payer: Medicare Other | Admitting: Physical Medicine & Rehabilitation

## 2019-10-08 ENCOUNTER — Other Ambulatory Visit: Payer: Self-pay | Admitting: Family Medicine

## 2019-10-08 DIAGNOSIS — I480 Paroxysmal atrial fibrillation: Secondary | ICD-10-CM

## 2019-10-15 ENCOUNTER — Ambulatory Visit (INDEPENDENT_AMBULATORY_CARE_PROVIDER_SITE_OTHER): Payer: Medicare Other | Admitting: General Practice

## 2019-10-15 ENCOUNTER — Other Ambulatory Visit: Payer: Self-pay

## 2019-10-15 DIAGNOSIS — Z7901 Long term (current) use of anticoagulants: Secondary | ICD-10-CM

## 2019-10-15 LAB — POCT INR: INR: 1.5 — AB (ref 2.0–3.0)

## 2019-10-15 NOTE — Patient Instructions (Signed)
Pre visit review using our clinic review tool, if applicable. No additional management support is needed unless otherwise documented below in the visit note.  Take 1 1/2 tablets today and tomorrow (8/16 and 8/17) and then change dosage and take  1 (3 mg) tablet daily.  Re-check in 2 weeks.

## 2019-10-18 ENCOUNTER — Other Ambulatory Visit: Payer: Self-pay

## 2019-10-18 ENCOUNTER — Encounter: Payer: Self-pay | Admitting: Internal Medicine

## 2019-10-18 ENCOUNTER — Ambulatory Visit: Payer: Medicare Other | Admitting: Internal Medicine

## 2019-10-18 VITALS — BP 108/76 | HR 78 | Ht 67.0 in | Wt 258.0 lb

## 2019-10-18 DIAGNOSIS — I2510 Atherosclerotic heart disease of native coronary artery without angina pectoris: Secondary | ICD-10-CM

## 2019-10-18 DIAGNOSIS — I38 Endocarditis, valve unspecified: Secondary | ICD-10-CM | POA: Diagnosis not present

## 2019-10-18 DIAGNOSIS — I5022 Chronic systolic (congestive) heart failure: Secondary | ICD-10-CM

## 2019-10-18 DIAGNOSIS — I480 Paroxysmal atrial fibrillation: Secondary | ICD-10-CM

## 2019-10-18 DIAGNOSIS — I10 Essential (primary) hypertension: Secondary | ICD-10-CM

## 2019-10-18 MED ORDER — FUROSEMIDE 20 MG PO TABS: 20.0000 mg | ORAL_TABLET | Freq: Every day | ORAL | 5 refills | Status: DC

## 2019-10-18 NOTE — Patient Instructions (Signed)
Medication Instructions:  Your physician has recommended you make the following change in your medication:   INCREASE Furosemide (Lasix) to 20 mg daily.  *If you need a refill on your cardiac medications before your next appointment, please call your pharmacy*   Lab Work: None ordered If you have labs (blood work) drawn today and your tests are completely normal, you will receive your results only by: Marland Kitchen MyChart Message (if you have MyChart) OR . A paper copy in the mail If you have any lab test that is abnormal or we need to change your treatment, we will call you to review the results.   Testing/Procedures: None ordered   Follow-Up: At [facility], you and your health needs are our priority.  As part of our continuing mission to provide you with exceptional heart care, we have created designated Provider Care Teams.  These Care Teams include your primary Cardiologist (physician) and Advanced Practice Providers (APPs -  Physician Assistants and Nurse Practitioners) who all work together to provide you with the care you need, when you need it.  We recommend signing up for the patient portal called "MyChart".  Sign up information is provided on this After Visit Summary.  MyChart is used to connect with patients for Virtual Visits (Telemedicine).  Patients are able to view lab/test results, encounter notes, upcoming appointments, etc.  Non-urgent messages can be sent to your provider as well.   To learn more about what you can do with MyChart, go to [email or web address].    Your next appointment:   3-4 week(s)  The format for your next appointment:   In Person  Provider:    You may see [name], MD or one of the following Advanced Practice Providers on your designated Care Team:    Murray Hodgkins, NP  Christell Faith, PA-C  Marrianne Mood, PA-C    Other Instructions N/A

## 2019-10-18 NOTE — Progress Notes (Signed)
Follow-up Outpatient Visit Date: 10/18/2019  Primary Care Provider: [name], [name], MD [place] [address]  Chief Complaint: Follow-up HF, valvular heart disease, and PAF  HPI:  Danielle [name] is a 74 y.o. female with history of aortic valve disease (severe regurgitation by her description) s/p bioprosthetic aortic valve replacement in [place] in 03/2014, NICM with LVEF as low as 30-35% by report in 03/2015, paroxysmal atrial fibrillation on chronic warfarin, stroke x 2, hyperlipidemia, hypertension, hyperthyroidism,left breast cancer,spontaneous pneumothorax, and left hip fracture, who presents for follow-up of valvular heart disease, cardiomyopathy, and atrial fibrillation.  I last saw her in mid April, at which time she reported stable exertional dyspnea.  Functional capacity was slowly improving, as she was walking more with her walker.  We agreed to increase Entresto to 49/51 mg twice daily in an effort to optimize goal-directed medical therapy.  Today, Danielle [name] feels about the same as at our last visit with chronic dyspnea on exertion with mild to moderate activity.  She is trying to increase her activity, though recover from her hip fracture has been slow.  She denies chest pain and lightheadedness.  Mild dependent edema, L>R, is stable.  She also has stable 2-3 pillow orthopnea.AAShe denies palpitations and lightheadedness.  She inquires about the need for bridging of her anticoagulation if elective procedures are needed in the future.  She is tolerating increased dose of Entresto well, though interesting she reports that her blood pressure seemed to increase when she first escalated the dose.  It is back to her baseline.  --------------------------------------------------------------------------------------------------  Cardiovascular History & Procedures: Cardiovascular Problems:  Aortic valve disease status post bioprosthetic AVR in  03/2014  Non-ischemic cardiomyopathy  Paroxysmal atrial fibrillation  Stroke  Risk Factors:  Hypertension, hyperlipidemia, stroke, and age > 65  Cath/PCI:  R/LHC (02/12/2014, [place]): Moderate proximal LAD calcification with mild, nonobstructive disease. Mildly reduced LVEF with severe apical hypokinesis. Normal pulmonary artery pressure.  CV Surgery:  Bioprosthetic aortic valve replacement (03/12/14, [place], [place], [place])  EP Procedures and Devices:  None  Non-Invasive Evaluation(s):  TTE (01/18/2019): Normal LV size with LVEF 35-40%. Mild LVH. Grade 1 diastolic dysfunction. Is in function. Bioprosthetic aortic valve with normal function. Normal PA pressure.  TTE (04/21/16): Normal obese size with moderate LVH. LVEF 35-40% with mid and apical anteroseptal hypokinesis. Grade 3 diastolic dysfunction noted. Aortic valve bioprosthesis present with a mean gradient of 11 mmHg. Mitral annular calcification noted. Normal RV size and function. Mild right atrial enlargement.  TTE (03/27/15, OSH): Mild LVH with LVEF 30-35%, mild left atrial enlargement, AVR in place without regurgitation.  TTE (05/10/14, OSH): LVEF 45-50%.  Recent CV Pertinent Labs: Lab Results  Component Value Date   CHOL 161 01/29/2019   HDL 64.90 01/29/2019   LDLCALC 81 01/29/2019   TRIG 77.0 01/29/2019   CHOLHDL 2 01/29/2019   INR 1.5 (A) 10/15/2019   INR 2.3 (H) 09/01/2018   K 5.2 06/28/2019   K 4.3 12/22/2016   MG 2.1 08/28/2018   BUN 14 06/28/2019   BUN 19.5 12/22/2016   CREATININE 1.13 (H) 06/28/2019   CREATININE 1.15 (H) 05/21/2019   CREATININE 1.2 (H) 12/22/2016    Past medical and surgical history were reviewed and updated in EPIC.  Current Meds  Medication Sig  . acetaminophen (TYLENOL) 500 MG tablet Take 1,000 mg by mouth daily as needed (PAIN).  Marland Kitchen albuterol (PROVENTIL HFA;VENTOLIN HFA) 108 (90 Base) MCG/ACT inhaler Inhale 2 puffs into the lungs every  6 (six)  hours as needed for wheezing or shortness of breath.   Marland Kitchen alendronate (FOSAMAX) 70 MG tablet Take 1 tablet (70 mg total) by mouth once a week. Take with a full glass of water on an empty stomach.  Marland Kitchen aspirin EC 81 MG tablet Take 81 mg daily by mouth.  . budesonide-formoterol (SYMBICORT) 160-4.5 MCG/ACT inhaler Inhale 2 puffs into the lungs 2 (two) times daily.  . carvedilol (COREG) 25 MG tablet Take 1 tablet (25 mg total) by mouth 2 (two) times daily.  . Cholecalciferol (VITAMIN D3) 400 units tablet Take 1 tablet (400 Units total) by mouth daily. (Patient taking differently: Take 1,000 Units by mouth daily. )  . diazepam (VALIUM) 5 MG tablet Take 5 mg by mouth once.  . diclofenac sodium (VOLTAREN) 1 % GEL Apply 4 g topically 4 (four) times daily.  . diphenhydramine-acetaminophen (TYLENOL PM) 25-500 MG TABS tablet Take 2 tablets by mouth at bedtime.   . furosemide (LASIX) 20 MG tablet Take 1 tablet (20 mg total) by mouth daily. (Patient taking differently: Take 20 mg by mouth every other day. )  . MELATONIN PO Take 1 tablet by mouth at bedtime.  . methimazole (TAPAZOLE) 5 MG tablet TAKE 3 TABLETS BY MOUTH EVERY EVENING. FOLLOW UP APPT IS NEEDED BEFORE MORE REFILLS ARE AUTHORIZED.  Marland Kitchen rosuvastatin (CRESTOR) 40 MG tablet Take 1 tablet (40 mg total) by mouth daily.  . sacubitril-valsartan (ENTRESTO) 49-51 MG Take 1 tablet by mouth 2 (two) times daily.  Marland Kitchen senna-docusate (SENOKOT-S) 8.6-50 MG tablet Take 2 tablets at bedtime by mouth.   . tamoxifen (NOLVADEX) 20 MG tablet Take 1 tablet by mouth once daily  . traMADol (ULTRAM) 50 MG tablet Take 1 tablet (50 mg total) by mouth 3 (three) times daily.  Marland Kitchen trolamine salicylate (ASPERCREME) 10 % cream Apply 1 application topically as needed for muscle pain.  . valACYclovir (VALTREX) 500 MG tablet Take 1 tablet (500 mg total) by mouth 2 (two) times daily. For 3 days when outbreaks.  Marland Kitchen warfarin (COUMADIN) 3 MG tablet TAKE AS DIRECTED BY ANTICOAGULATION CLINIC     Allergies: Lisinopril, Tetanus toxoid adsorbed, and Other  Social History   Tobacco Use  . Smoking status: Former Smoker    Packs/day: 0.25    Years: 10.00    Pack years: 2.50    Types: Cigarettes    Quit date: 03/01/2010    Years since quitting: 9.6  . Smokeless tobacco: Never Used  Vaping Use  . Vaping Use: Never used  Substance Use Topics  . Alcohol use: No  . Drug use: No    Family History  Problem Relation Age of Onset  . Diabetes Mother   . Heart attack Mother 36  . Diabetes Father   . Lung cancer Father   . Diabetes Sister   . Thyroid disease Sister   . Diabetes Sister   . HIV Brother     Review of Systems: A 12-system review of systems was performed and was negative except as noted in the HPI.  --------------------------------------------------------------------------------------------------  Physical Exam: BP 108/76 (BP Location: Right Arm, Patient Position: Sitting, Cuff Size: Large)   Pulse 78   Ht 5\' 7"  (1.702 m)   Wt 258 lb (117 kg)   SpO2 98%   BMI 40.41 kg/m   General:  NAD, seated in a wheelchair.  She is accompanied by her husband. Neck: JVP difficult to assess due to body habitus. Lungs: CTA bilaterally. Heart: RRR with 1//6 systolic  murmur.  No rubs or gallops. Abd: Soft, NT/ND. Ext: Trace to 1+ calf edema, L>R.  EKG: NSR with 1st degree AV block (PR interval 214 ms), borderline LVH, and inferior/anterolateral Q waves.  PR interval slightly longer than prior tracing from 06/13/19.  PVC also no longer present.  Otherwise, no significant interval change.  Lab Results  Component Value Date   WBC 4.3 05/21/2019   HGB 12.8 05/21/2019   HCT 40.0 05/21/2019   MCV 96.9 05/21/2019   PLT 94 (L) 05/21/2019    Lab Results  Component Value Date   NA 142 06/28/2019   K 5.2 06/28/2019   CL 108 (H) 06/28/2019   CO2 21 06/28/2019   BUN 14 06/28/2019   CREATININE 1.13 (H) 06/28/2019   GLUCOSE 89 06/28/2019   ALT 11 05/21/2019    Lab  Results  Component Value Date   CHOL 161 01/29/2019   HDL 64.90 01/29/2019   LDLCALC 81 01/29/2019   TRIG 77.0 01/29/2019   CHOLHDL 2 01/29/2019    --------------------------------------------------------------------------------------------------  ASSESSMENT AND PLAN: Chronic HFrEF: Danielle [name] reports stable NYHA class III symptoms, which are confounded by deconditioning following her femur fracture with slow recovery.  Mild edema noted on exam.  Weight is stable.  I have suggested increasing furosemide to 20 mg; she will likely need further dose escalation in the future.  Danielle [name] believes that her dyspnea is largely driven by underlying lung disease.  If symptoms do not improve with escalation of diuresis, R/LHC may need to be considered in the future.  Continue current doses of carvedilol and Entresto, with plans to repeat BMP when she is seen for f/u in ~1 month.  Unable to add spironolactone due to hyperkalemia in the past.  Valvular heart disease: Continue aspirin and warfarin (h/o PAF), as well as SBE prophylaxis for dental procedures.  Aortic valve bioprosthesis was functioning appropriately on last echo in 12/2018.  Paroxysmal atrial fibrillation: Patient maintaining NSR today.  We will continue current dose of carvedilol with close monitoring of PR interval given mild 1st degree AV block.  Continue warfarin with INR monitoring per Dr. [name].  Given history of PAF with prior stroke, reduced LVEF, and bioprosthetic AVR, I would recommend Lovenox bridging if anticoagulation must be interrupted for elective procedures.  Hypertension: Blood pressure well-controlled today.  We will increase furosemide today.  Otherwise, no medication changes at this time.  Morbid obesity: BMI remains > 40.  I encouraged weight loss through diet and exercise, though limited mobility remains a problem.  Follow-up: Return to clinic in 3-4 weeks.  [name],  MD 10/18/2019 11:31 AM

## 2019-10-20 ENCOUNTER — Encounter: Payer: Self-pay | Admitting: Internal Medicine

## 2019-10-22 ENCOUNTER — Other Ambulatory Visit: Payer: Self-pay | Admitting: Family Medicine

## 2019-10-22 DIAGNOSIS — E0590 Thyrotoxicosis, unspecified without thyrotoxic crisis or storm: Secondary | ICD-10-CM

## 2019-10-29 ENCOUNTER — Other Ambulatory Visit: Payer: Self-pay

## 2019-10-29 ENCOUNTER — Ambulatory Visit (INDEPENDENT_AMBULATORY_CARE_PROVIDER_SITE_OTHER): Payer: Medicare Other | Admitting: General Practice

## 2019-10-29 DIAGNOSIS — Z7901 Long term (current) use of anticoagulants: Secondary | ICD-10-CM | POA: Diagnosis not present

## 2019-10-29 LAB — POCT INR: INR: 2.7 (ref 2.0–3.0)

## 2019-10-29 NOTE — Patient Instructions (Signed)
Pre visit review using our clinic review tool, if applicable. No additional management support is needed unless otherwise documented below in the visit note.  Continue to take 1 (3 mg) tablet daily.  Re-check in 4 weeks.  

## 2019-11-12 ENCOUNTER — Other Ambulatory Visit: Payer: Self-pay | Admitting: Hematology

## 2019-11-12 DIAGNOSIS — Z853 Personal history of malignant neoplasm of breast: Secondary | ICD-10-CM

## 2019-11-12 NOTE — Progress Notes (Signed)
Cardiology Office Note    Date:  11/15/2019   ID:  Danielle [name], DOB 08/18/1945, MRN [id number]  PCP:  [name], [name], MD  Cardiologist:  Danielle Bush, MD  Electrophysiologist:  None   Chief Complaint: Follow up  History of Present Illness:   Danielle [name] is a 74 y.o. female with history of aortic valve disease (patient reported severe regurgitation) s/p bioprosthetic aortic valve replacement in [place] in 03/2014, HFrEF secondary to NICM with EF as low as 30-35% by report in 03/2015, PAF on warfarin, CVA x 2, HTN, HLD, hyperthyroidism, left-sided breast cancer, spontaneous pneumothorax x2, and left hip fracture who presents for follow up of valvular heart disease, nonischemic cardiomyopathy, and PAF.   Prior R/LHC in 01/2014 showed moderate proximal LAD calcification with mild nonobstructive disease, mildly reduced LVSF with severe apical HK and normal PA pressure. Most recent echo from 12/2018 showed a stable LVWF of 35-40%, Gr1DD, bioprosthetic aortic valve with normal function and normal PA pressure. Please see below for prior echoes. She was last seen in the office in 09/2019, and was feeling about the same when compared to her visit in 05/2019, noting chronic exertional dyspnea with mild to moderate activity. She was tolerating the previously titrated dose of Entresto. Her activity status was hampered by a slow recovery from a left hip fracture. Her weight was stable, though did have some mild edema noted. Her Lasix was titrated to 20 mg (already taking this dose) with consideration for [facility] if her symptoms persisted.   She comes in today accompanied by her husband and feels about the same as she has for last couple of visits continuing to note exertional dyspnea with mild to moderate activity.  She denies any chest pain, palpitations, dizziness, presyncope, or syncope.  She has stable mild dependent edema involving the feet with the left being worse than the  right.  She has stable two-pillow orthopnea and denies any abdominal distention, PND, or early satiety.  Her weight remains a stable at home.  She is tolerating her medications without issues.  No falls, hematochezia, or melena.   Labs independently reviewed: 10/29/2019 - INR 2.7 05/2019 - BUN 14, SCr 1.13, potassium 5.2 04/2019 - HGB 12.8, PLT 94, albumin 3.8, AST/ALT normal 12/2018 - TC 161, TG 77, HDL 64, LDL 81, TSH normal  Past Medical History:  Diagnosis Date  . Allergy   . Anxiety   . Aortic stenosis    Status post bioprosthetic AVR  . Arthritis    DJD  . Asthma   . Breast cancer ([place]) 12/13/2016   Left breast  . Chronic systolic CHF (congestive heart failure) ([place])    EF 30% 04/2014  . COPD (chronic obstructive pulmonary disease) ([place])   . Coronary artery disease    per pt, had LHC prior to AVR in [place] that did not show any blockages; no stents/bypass  . Gastritis   . Hyperlipidemia   . Hypertension   . Hyperthyroidism   . Multiple thyroid nodules   . Osteoporosis   . Paroxysmal atrial fibrillation (HCC)   . Pelvis fracture ([place]) 08/19/2015   MULTIPLE   . Personal history of radiation therapy 2018  . Spontaneous pneumothorax 11/28/2015   left   . Stroke [facility]) 2000   rt hand weak    Past Surgical History:  Procedure Laterality Date  . BREAST LUMPECTOMY Left 12/2016  . BREAST LUMPECTOMY WITH RADIOACTIVE SEED AND SENTINEL LYMPH NODE BIOPSY Left 01/14/2017   Procedure: LEFT  BREAST LUMPECTOMY WITH RADIOACTIVE SEED AND SENTINEL LYMPH NODE BIOPSY;  Surgeon: Rolm Bookbinder, MD;  Location: [place];  Service: General;  Laterality: Left;  . CHEST TUBE INSERTION  10/2015  . INTRAMEDULLARY (IM) NAIL INTERTROCHANTERIC Left 12/10/2015   Procedure: INTRAMEDULLARY (IM) NAIL INTERTROCHANTRIC;  Surgeon: Leandrew Koyanagi, MD;  Location: [place];  Service: Orthopedics;  Laterality: Left;  . PLEURADESIS Left 12/03/2015   Procedure: PLEURADESIS;  Surgeon: Melrose Nakayama, MD;   Location: [place];  Service: Thoracic;  Laterality: Left;  . RESECTION OF APICAL BLEB Left 12/03/2015   Procedure: BLEBECTOMY;  Surgeon: Melrose Nakayama, MD;  Location: [place];  Service: Thoracic;  Laterality: Left;  . THORACOSCOPY  12/03/2015  . VALVE REPLACEMENT    . VIDEO ASSISTED THORACOSCOPY Left 12/03/2015   Procedure: VIDEO ASSISTED THORACOSCOPY;  Surgeon: Melrose Nakayama, MD;  Location[facility];  Service: Thoracic;  Laterality: Left;    Current Medications: Current Meds  Medication Sig  . acetaminophen (TYLENOL) 500 MG tablet Take 1,000 mg by mouth daily as needed (PAIN).  Marland Kitchen albuterol (PROVENTIL HFA;VENTOLIN HFA) 108 (90 Base) MCG/ACT inhaler Inhale 2 puffs into the lungs every 6 (six) hours as needed for wheezing or shortness of breath.   Marland Kitchen alendronate (FOSAMAX) 70 MG tablet Take 1 tablet (70 mg total) by mouth once a week. Take with a full glass of water on an empty stomach.  Marland Kitchen aspirin EC 81 MG tablet Take 81 mg daily by mouth.  . budesonide-formoterol (SYMBICORT) 160-4.5 MCG/ACT inhaler Inhale 2 puffs into the lungs 2 (two) times daily.  . carvedilol (COREG) 25 MG tablet Take 1 tablet (25 mg total) by mouth 2 (two) times daily.  . Cholecalciferol (VITAMIN D3) 400 units tablet Take 1 tablet (400 Units total) by mouth daily. (Patient taking differently: Take 1,000 Units by mouth daily. )  . diazepam (VALIUM) 5 MG tablet Take 5 mg by mouth once.  . diclofenac sodium (VOLTAREN) 1 % GEL Apply 4 [name] topically 4 (four) times daily.  . diphenhydramine-acetaminophen (TYLENOL PM) 25-500 MG TABS tablet Take 2 tablets by mouth at bedtime.   . furosemide (LASIX) 20 MG tablet Take 1 tablet (20 mg total) by mouth daily.  Marland Kitchen MELATONIN PO Take 1 tablet by mouth at bedtime.  . methimazole (TAPAZOLE) 5 MG tablet TAKE 3 TABLETS BY MOUTH IN THE EVENING . APPOINTMENT REQUIRED FOR FUTURE REFILLS  . rosuvastatin (CRESTOR) 40 MG tablet Take 1 tablet (40 mg total) by mouth daily.  . sacubitril-valsartan  (ENTRESTO) 49-51 MG Take 1 tablet by mouth 2 (two) times daily.  Marland Kitchen senna-docusate (SENOKOT-S) 8.6-50 MG tablet Take 2 tablets at bedtime by mouth.   . tamoxifen (NOLVADEX) 20 MG tablet Take 1 tablet by mouth once daily  . traMADol (ULTRAM) 50 MG tablet Take 1 tablet (50 mg total) by mouth 3 (three) times daily.  Marland Kitchen trolamine salicylate (ASPERCREME) 10 % cream Apply 1 application topically as needed for muscle pain.  . valACYclovir (VALTREX) 500 MG tablet Take 1 tablet (500 mg total) by mouth 2 (two) times daily. For 3 days when outbreaks.  Marland Kitchen warfarin (COUMADIN) 1 MG tablet As directed by anticoagulation clinic  . warfarin (COUMADIN) 3 MG tablet TAKE AS DIRECTED BY ANTICOAGULATION CLINIC    Allergies:   Lisinopril, Tetanus toxoid adsorbed, and Other   Social History   Socioeconomic History  . Marital status: Married    Spouse name: Sherwood  . Number of children: 0  . Years of education:  Not on file  . Highest education level: Not on file  Occupational History  . Occupation: Retired in 2004  Tobacco Use  . Smoking status: Former Smoker    Packs/day: 0.25    Years: 10.00    Pack years: 2.50    Types: Cigarettes    Quit date: 03/01/2010    Years since quitting: 9.7  . Smokeless tobacco: Never Used  Vaping Use  . Vaping Use: Never used  Substance and Sexual Activity  . Alcohol use: No  . Drug use: No  . Sexual activity: Not Currently  Other Topics Concern  . Not on file  Social History Narrative   Lives with husband.  Ambulated independently.   Social Determinants of Health   Financial Resource Strain: Low Risk   . Difficulty of Paying Living Expenses: Not very hard  Food Insecurity:   . Worried About Charity fundraiser in the Last Year: Not on file  . Ran Out of Food in the Last Year: Not on file  Transportation Needs: No Transportation Needs  . Lack of Transportation (Medical): No  . Lack of Transportation (Non-Medical): No  Physical Activity:   . Days of Exercise per  Week: Not on file  . Minutes of Exercise per Session: Not on file  Stress:   . Feeling of Stress : Not on file  Social Connections:   . Frequency of Communication with Friends and Family: Not on file  . Frequency of Social Gatherings with Friends and Family: Not on file  . Attends Religious Services: Not on file  . Active Member of Clubs or Organizations: Not on file  . Attends Archivist Meetings: Not on file  . Marital Status: Not on file     Family History:  The patient's family history includes Diabetes in her father, mother, sister, and sister; HIV in her brother; Heart attack (age of onset: 54) in her mother; Lung cancer in her father; Thyroid disease in her sister.  ROS:   Review of Systems  Constitutional: Positive for malaise/fatigue. Negative for chills, diaphoresis, fever and weight loss.  HENT: Negative for congestion.   Eyes: Negative for discharge and redness.  Respiratory: Positive for shortness of breath. Negative for cough, sputum production and wheezing.   Cardiovascular: Positive for leg swelling. Negative for chest pain, palpitations, orthopnea, claudication and PND.  Gastrointestinal: Negative for abdominal pain, blood in stool, heartburn, melena, nausea and vomiting.  Musculoskeletal: Negative for falls and myalgias.  Skin: Negative for rash.  Neurological: Negative for dizziness, tingling, tremors, sensory change, speech change, focal weakness, loss of consciousness and weakness.  Endo/Heme/Allergies: Does not bruise/bleed easily.  Psychiatric/Behavioral: Negative for substance abuse. The patient is not nervous/anxious.   All other systems reviewed and are negative.    EKGs/Labs/Other Studies Reviewed:    Studies reviewed were summarized above. The additional studies were reviewed today:  2D echo 12/2018: 1. Left ventricular ejection fraction, by visual estimation, is 35 to  40%. The left ventricle has moderately decreased function. There is  mildly  increased left ventricular hypertrophy.  2. The left ventricle demonstrates global hypokinesis.  3. Left ventricular diastolic parameters are consistent with Grade I  diastolic dysfunction (impaired relaxation).  4. Global right ventricle has normal systolic function.The right  ventricular size is normal. No increase in right ventricular wall  thickness.  5. Left atrial size was mildly dilated.  6. Bioprosthetic aortic valve, not well visualized, No evidence of aortic  valve sclerosis or stenosis.  7. Aortic valve mean gradient measures 9.5 mmHg.  8. aortic root measuring 35 mm.  9. Normal pulmonary artery systolic pressure.  10. Definity contrast agent was given IV to delineate the left ventricular  endocardial borders. __________  2D echo 12/2016: - Left ventricle: The cavity size was normal. There was moderate  concentric hypertrophy. Systolic function was moderately reduced.  The estimated ejection fraction was in the range of 35% to 40%.  Wall motion was normal; there were no regional wall motion  abnormalities. Doppler parameters are consistent with restrictive  physiology, indicative of decreased left ventricular diastolic  compliance and/or increased left atrial pressure. Doppler  parameters are consistent with elevated ventricular end-diastolic  filling pressure.  - Ventricular septum: Septal motion showed paradox.  - Aortic valve: Mean gradient (S): 9 mm Hg. Peak gradient (S): 17  mm Hg.  - Left atrium: The atrium was mildly dilated.  - Right atrium: The atrium was normal in size.  - Pericardium, extracardiac: There was no pericardial effusion.   Impressions:   - No significant change since the prior study. LVEF remaines  moderately decreased at 35-40% with diffuse hypokinesis and  assynchronous septal motion.  Bioprosthetic valve sits well in the aortic position. There are  normal transaortic gradients and no aortic  regurgitation or  paravalvular leak.  __________  2D echo 04/2016: - Left ventricle: The cavity size was normal. Wall thickness was  increased in a pattern of moderate LVH. Systolic function was  moderately reduced. The estimated ejection fraction was in the  range of 35% to 40%. Severe hypokinesis of the  mid-apicalanteroseptal myocardium. Doppler parameters are  consistent with a reversible restrictive pattern, indicative of  decreased left ventricular diastolic compliance and/or increased  left atrial pressure (grade 3 diastolic dysfunction).  - Aortic valve: A bioprosthesis was present.  - Mitral valve: Mildly to moderately calcified annulus.  - Right atrium: The atrium was mildly dilated.   Impressions:   - Moderate LV systolic dysfunction with EF 69-67%  Grade 3 diastolic dysfunction  normal TAVR    EKG:  EKG was not ordered today.   Recent Labs: 01/29/2019: TSH 0.55 05/21/2019: ALT 11; Hemoglobin 12.8; Platelet Count 94 06/28/2019: BUN 14; Creatinine, Ser 1.13; Potassium 5.2; Sodium 142  Recent Lipid Panel    Component Value Date/Time   CHOL 161 01/29/2019 1232   TRIG 77.0 01/29/2019 1232   HDL 64.90 01/29/2019 1232   CHOLHDL 2 01/29/2019 1232   VLDL 15.4 01/29/2019 1232   LDLCALC 81 01/29/2019 1232    PHYSICAL EXAM:    VS:  BP 114/84   Pulse 68   Resp 15   Wt 258 lb 3.2 oz (117.1 kg)   BMI 40.44 kg/m   BMI: Body mass index is 40.44 kg/m.  Physical Exam Constitutional:      Appearance: She is well-developed.  HENT:     Head: Normocephalic and atraumatic.  Eyes:     General:        Right eye: No discharge.        Left eye: No discharge.  Neck:     Vascular: No JVD.  Cardiovascular:     Rate and Rhythm: Normal rate and regular rhythm.     Pulses: No midsystolic click and no opening snap.          Posterior tibial pulses are 2+ on the right side and 2+ on the left side.     Heart sounds: S1 normal and S2 normal. Heart sounds  not  distant. Murmur heard.  Systolic murmur is present with a grade of 1/6.  No friction rub.     Comments: Trace left-sided pedal edema Pulmonary:     Effort: Pulmonary effort is normal. No respiratory distress.     Breath sounds: Examination of the right-lower field reveals rales. Rales present. No decreased breath sounds or wheezing.  Chest:     Chest wall: No tenderness.  Abdominal:     General: There is no distension.     Palpations: Abdomen is soft.     Tenderness: There is no abdominal tenderness.  Musculoskeletal:     Cervical back: Normal range of motion.  Skin:    General: Skin is warm and dry.     Nails: There is no clubbing.  Neurological:     Mental Status: She is alert and oriented to person, place, and time.  Psychiatric:        Speech: Speech normal.        Behavior: Behavior normal.        Thought Content: Thought content normal.        Judgment: Judgment normal.     Wt Readings from Last 3 Encounters:  11/15/19 258 lb 3.2 oz (117.1 kg)  10/18/19 258 lb (117 kg)  08/24/19 256 lb 12.8 oz (116.5 kg)     ASSESSMENT & PLAN:   1. HFrEF secondary to NICM: Volume status is somewhat difficult to assess secondary to body habitus.  That said, she does have crackles along the right base.  She has NYHA class III symptoms with somewhat limiting factors being noncardiac including slow recovery from recent hip fracture, morbid obesity, and physical deconditioning.  Increase Lasix to 40 mg daily for 3 days followed by 20 mg daily thereafter.  Check BMP in 1 week.  She will otherwise continue current dose carvedilol and Entresto.  Not currently on spironolactone with history of hyperkalemia.  Update echo given underlying cardiomyopathy and valvular heart disease with persistent exertional dyspnea symptoms.  She attributes her dyspnea to her 2 prior pneumothorax episodes.  If following escalation of diuresis, her symptoms persist, and based on echo findings, R/LHC may need to be  considered.  CHF education.  2. PAF: She is maintaining sinus rhythm.  Continue rate control with carvedilol.  She remains on Coumadin with INR monitoring through her PCPs office.  No symptoms concerning for bleeding.  Recent hemoglobin stable.  3. Valvular heart disease: Remains on aspirin and warfarin.  SBE prophylaxis for dental procedures was discussed.  Aortic valve bioprosthesis was functioning appropriately on echo in 12/2018.  Update echo as outlined above given persistent dyspnea.  4. HTN: Blood pressure is well controlled in the office today.  He remains on carvedilol, Entresto, and furosemide.  5. HLD: LDL 81 from 12/2018 with normal LFT in 04/2019.  She remains on Crestor.  6. Morbid obesity: Weight loss is recommended though limited mobility remains a limiting factor at this time.  Disposition: F/u with Dr. Saunders Revel or an APP in 2 weeks.   Medication Adjustments/Labs and Tests Ordered: Current medicines are reviewed at length with the patient today.  Concerns regarding medicines are outlined above. Medication changes, Labs and Tests ordered today are summarized above and listed in the Patient Instructions accessible in Encounters.   Signed, Christell Faith, PA-C 11/15/2019 9:49 AM     [facility] [address] [place] [address] [place], [place] [address] [phone]

## 2019-11-15 ENCOUNTER — Other Ambulatory Visit
Admission: RE | Admit: 2019-11-15 | Discharge: 2019-11-15 | Disposition: A | Discharge status: Home / Self Care | Payer: Medicare Other | Attending: Physician Assistant | Admitting: Physician Assistant

## 2019-11-15 ENCOUNTER — Ambulatory Visit: Payer: Medicare Other | Admitting: Physician Assistant

## 2019-11-15 ENCOUNTER — Encounter: Payer: Self-pay | Admitting: Physician Assistant

## 2019-11-15 ENCOUNTER — Other Ambulatory Visit: Payer: Self-pay

## 2019-11-15 ENCOUNTER — Telehealth: Payer: Self-pay

## 2019-11-15 VITALS — BP 114/84 | HR 68 | Resp 15 | Wt 258.2 lb

## 2019-11-15 DIAGNOSIS — I5022 Chronic systolic (congestive) heart failure: Secondary | ICD-10-CM

## 2019-11-15 DIAGNOSIS — Z79899 Other long term (current) drug therapy: Secondary | ICD-10-CM | POA: Insufficient documentation

## 2019-11-15 DIAGNOSIS — I38 Endocarditis, valve unspecified: Secondary | ICD-10-CM | POA: Diagnosis not present

## 2019-11-15 DIAGNOSIS — I480 Paroxysmal atrial fibrillation: Secondary | ICD-10-CM | POA: Diagnosis not present

## 2019-11-15 DIAGNOSIS — Z952 Presence of prosthetic heart valve: Secondary | ICD-10-CM

## 2019-11-15 DIAGNOSIS — I10 Essential (primary) hypertension: Secondary | ICD-10-CM

## 2019-11-15 DIAGNOSIS — I428 Other cardiomyopathies: Secondary | ICD-10-CM | POA: Diagnosis not present

## 2019-11-15 DIAGNOSIS — E782 Mixed hyperlipidemia: Secondary | ICD-10-CM

## 2019-11-15 LAB — CBC WITH DIFFERENTIAL/PLATELET
Abs Immature Granulocytes: 0.02 10*3/uL (ref 0.00–0.07)
Basophils Absolute: 0 10*3/uL (ref 0.0–0.1)
Basophils Relative: 1 %
Eosinophils Absolute: 0.4 10*3/uL (ref 0.0–0.5)
Eosinophils Relative: 6 %
HCT: 38.8 % (ref 36.0–46.0)
Hemoglobin: 12.4 g/dL (ref 12.0–15.0)
Immature Granulocytes: 0 %
Lymphocytes Relative: 24 %
Lymphs Abs: 1.4 10*3/uL (ref 0.7–4.0)
MCH: 30.8 pg (ref 26.0–34.0)
MCHC: 32 g/dL (ref 30.0–36.0)
MCV: 96.3 fL (ref 80.0–100.0)
Monocytes Absolute: 0.5 10*3/uL (ref 0.1–1.0)
Monocytes Relative: 9 %
Neutro Abs: 3.4 10*3/uL (ref 1.7–7.7)
Neutrophils Relative %: 60 %
Platelets: 100 10*3/uL — ABNORMAL LOW (ref 150–400)
RBC: 4.03 MIL/uL (ref 3.87–5.11)
RDW: 14.3 % (ref 11.5–15.5)
WBC: 5.8 10*3/uL (ref 4.0–10.5)
nRBC: 0 % (ref 0.0–0.2)

## 2019-11-15 LAB — BASIC METABOLIC PANEL
Anion gap: 8 (ref 5–15)
BUN: 13 mg/dL (ref 8–23)
CO2: 25 mmol/L (ref 22–32)
Calcium: 9.6 mg/dL (ref 8.9–10.3)
Chloride: 106 mmol/L (ref 98–111)
Creatinine, Ser: 1.26 mg/dL — ABNORMAL HIGH (ref 0.44–1.00)
GFR calc Af Amer: 49 mL/min — ABNORMAL LOW (ref >60)
GFR calc non Af Amer: 42 mL/min — ABNORMAL LOW (ref >60)
Glucose, Bld: 104 mg/dL — ABNORMAL HIGH (ref 70–99)
Potassium: 4.9 mmol/L (ref 3.5–5.1)
Sodium: 139 mmol/L (ref 135–145)

## 2019-11-15 MED ORDER — FUROSEMIDE 20 MG PO TABS
ORAL_TABLET | ORAL | 1 refills | Status: DC
Start: 2019-11-15 — End: 2019-11-28

## 2019-11-15 NOTE — Telephone Encounter (Signed)
Called to give the patient lab results and Christell Faith, PA recommendation. [email or web address].

## 2019-11-15 NOTE — Telephone Encounter (Signed)
-----   Message from Rise Mu, PA-C sent at 11/15/2019 11:53 AM EDT ----- Blood count is normal Platelet count is mildly low though improved from prior studies Renal function mildly elevated though consistent with some of her prior readings  Potassium is high normal  Recommendations: -Please have patient on increase Lasix to 40 mg daily for 3 days, following this please have her come back to 20 mg daily -Please have her come by the medical mall next week for follow-up BMET

## 2019-11-15 NOTE — Patient Instructions (Signed)
Medication Instructions:  Your physician has recommended you make the following change in your medication:  1- INCREASE Lasix 40 mg (2 tablets) by mouth once a day for 1 week, THEN take 20 mg (1 tablet) by mouth once a day.  *If you need a refill on your cardiac medications before your next appointment, please call your pharmacy*   Lab Work: Your physician recommends that you return for lab work in: [place] at the [facility] for BMET, [place].  - Please go to the [facility]. You will check in at the front desk to the right as you walk into the atrium. Valet Parking is offered if needed. - No appointment needed. You may go any day between 7 am and 6 pm.  If you have labs (blood work) drawn today and your tests are completely normal, you will receive your results only by: Marland Kitchen MyChart Message (if you have MyChart) OR . A paper copy in the mail If you have any lab test that is abnormal or we need to change your treatment, we will call you to review the results.   Testing/Procedures:  Your physician has requested that you have an echocardiogram. Echocardiography is a painless test that uses sound waves to create images of your heart. It provides your doctor with information about the size and shape of your heart and how well your heart's chambers and valves are working. This procedure takes approximately one hour. There are no restrictions for this procedure. You may get an IV, if needed, to receive an ultrasound enhancing agent through to better visualize your heart.   Follow-Up: At [facility], you and your health needs are our priority.  As part of our continuing mission to provide you with exceptional heart care, we have created designated Provider Care Teams.  These Care Teams include your primary Cardiologist (physician) and Advanced Practice Providers (APPs -  Physician Assistants and Nurse Practitioners) who all work together to provide you with the care you need, when you need  it.  We recommend signing up for the patient portal called "MyChart".  Sign up information is provided on this After Visit Summary.  MyChart is used to connect with patients for Virtual Visits (Telemedicine).  Patients are able to view lab/test results, encounter notes, upcoming appointments, etc.  Non-urgent messages can be sent to your provider as well.   To learn more about what you can do with MyChart, go to [email or web address].    Your next appointment:   2 week(s) (ok if echo cannot be done prior to the appointment)  The format for your next appointment:   In Person  Provider:    You may see [name], MD or one of the following Advanced Practice Providers on your designated Care Team:    Murray Hodgkins, NP  Christell Faith, PA-C  Marrianne Mood, PA-C

## 2019-11-19 NOTE — Telephone Encounter (Signed)
Attempted to call the patient. No answer- I left a message to please call back.  

## 2019-11-20 ENCOUNTER — Encounter: Payer: Self-pay | Admitting: *Deleted

## 2019-11-20 NOTE — Telephone Encounter (Signed)
Patient called back. She verbalized understanding of results.  From office visit, she was in increase furosemide to 40 mg for 1 week. She did already take the 40 mg this morning and will go back to the 20 mg tomorrow.  She has echo scheduled for tomorrow in our office and is agreeable to go to the [facility] before or after echo for lab work.

## 2019-11-20 NOTE — Progress Notes (Signed)
Cardiology Office Note    Date:  11/28/2019   ID:  Danielle [name], DOB [birth year], MRN [id number]  PCP:  [name], [name], MD  Cardiologist:  [name], MD  Electrophysiologist:  None   Chief Complaint: Follow up  History of Present Illness:   Danielle [name] is a 74 y.o. female with history of aortic valve disease (patient reported severe regurgitation) s/p bioprosthetic aortic valve replacement in [place] in 03/2014, HFrEF secondary to NICM with EF as low as 30-35% by report in 03/2015, PAF on warfarin, CVA x 2, HTN, HLD, hyperthyroidism, left-sided breast cancer, spontaneous pneumothorax x2, and left hip fracture who presents for follow up of valvular heart disease, nonischemic cardiomyopathy, and PAF.   Prior R/LHC in 01/2014 showed moderate proximal LAD calcification with mild nonobstructive disease, mildly reduced LVSF with severe apical HK and normal PA pressure. Echo from 12/2018 showed a stable LVWF of 35-40%, Gr1DD, bioprosthetic aortic valve with normal function and normal PA pressure. Please see below for prior echoes. She was seen in the office in 09/2019, and was feeling about the same when compared to her visit in 05/2019, noting chronic exertional dyspnea with mild to moderate activity. She was tolerating the previously titrated dose of Entresto. Her activity status was hampered by a slow recovery from a left hip fracture. Her weight was stable, though did have some mild edema noted. Her Lasix was titrated to 20 mg (already taking this dose) with consideration for [facility] if her symptoms persisted.   She was last seen in the office on 11/15/2019 and continued to feel about the same as she had for her prior couple of visits noting exertional dyspnea with mild to moderate activity. She had stable two-pillow orthopnea. Her weight remained stable at home. Her weight in the office was unchanged. It was recommended she increase her Lasix to 40 mg daily x3 days  followed by resumption of 20 mg daily thereafter. Echo on 9/22 showed an improved EF of 45 to 50%, global hypokinesis, normal RV systolic function and RV cavity size, and a normal functioning bioprosthetic aortic valve with no significant abnormalities.   She comes in today and is doing well from a cardiac perspective.  She is accompanied by her husband.  She feels about the same as she did at her last visit.  She denies any chest pain, palpitations, dizziness, presyncope, or syncope.  The trace left pedal edema has improved with gentle diuresis.  She has stable two-pillow orthopnea and denies any abdominal distention, PND, early satiety.  Her weight remains stable at home.  She is tolerating her medications without issues.  She denies any falls, hematochezia, or melena.  She will be resuming PT in the near future.  She is pleased with her trend.   Labs independently reviewed: 11/26/2019 - INR 3.8 10/2019 - potassium 4.9, BUN 13, serum creatinine 1.26, Hgb 12.4, PLT 100 04/2019 - albumin 3.8, AST/ALT normal 12/2018 - TC 161, TG 77, HDL 64, LDL 81, TSH normal  Past Medical History:  Diagnosis Date   Allergy    Anxiety    Aortic stenosis    Status post bioprosthetic AVR   Arthritis    DJD   Asthma    Breast cancer ([place]) 12/13/2016   Left breast   Chronic systolic CHF (congestive heart failure) (HCC)    EF 30% 04/2014   COPD (chronic obstructive pulmonary disease) (HCC)    Coronary artery disease    per pt, had LHC prior to AVR  in [place] 2016 that did not show any blockages; no stents/bypass   Gastritis    Hyperlipidemia    Hypertension    Hyperthyroidism    Multiple thyroid nodules    Osteoporosis    Paroxysmal atrial fibrillation ([place])    Pelvis fracture ([place]) 08/19/2015   MULTIPLE    Personal history of radiation therapy 2018   Spontaneous pneumothorax 11/28/2015   left    Stroke [facility]) 2000   rt hand weak    Past Surgical History:  Procedure Laterality  Date   BREAST LUMPECTOMY Left 12/2016   BREAST LUMPECTOMY WITH RADIOACTIVE SEED AND SENTINEL LYMPH NODE BIOPSY Left 01/14/2017   Procedure: LEFT BREAST LUMPECTOMY WITH RADIOACTIVE SEED AND SENTINEL LYMPH NODE BIOPSY;  Surgeon: Rolm Bookbinder, MD;  Location: [place];  Service: General;  Laterality: Left;   CHEST TUBE INSERTION  10/2015   INTRAMEDULLARY (IM) NAIL INTERTROCHANTERIC Left 12/10/2015   Procedure: INTRAMEDULLARY (IM) NAIL INTERTROCHANTRIC;  Surgeon: Leandrew Koyanagi, MD;  Location: [place];  Service: Orthopedics;  Laterality: Left;   PLEURADESIS Left 12/03/2015   Procedure: PLEURADESIS;  Surgeon: Melrose Nakayama, MD;  Location: [place];  Service: Thoracic;  Laterality: Left;   RESECTION OF APICAL BLEB Left 12/03/2015   Procedure: BLEBECTOMY;  Surgeon: Melrose Nakayama, MD;  Location: [place];  Service: Thoracic;  Laterality: Left;   THORACOSCOPY  12/03/2015   VALVE REPLACEMENT     VIDEO ASSISTED THORACOSCOPY Left 12/03/2015   Procedure: VIDEO ASSISTED THORACOSCOPY;  Surgeon: Melrose Nakayama, MD;  Location: [place];  Service: Thoracic;  Laterality: Left;    Current Medications: No outpatient medications have been marked as taking for the 11/28/19 encounter (Office Visit) with Rise Mu, PA-C.    Allergies:   Lisinopril, Tetanus toxoid adsorbed, and Other   Social History   Socioeconomic History   Marital status: Married    Spouse name: Sherwood   Number of children: 0   Years of education: Not on file   Highest education level: Not on file  Occupational History   Occupation: Retired in 2004  Tobacco Use   Smoking status: Former Smoker    Packs/day: 0.25    Years: 10.00    Pack years: 2.50    Types: Cigarettes    Quit date: 03/01/2010    Years since quitting: 9.7   Smokeless tobacco: Never Used  Vaping Use   Vaping Use: Never used  Substance and Sexual Activity   Alcohol use: No   Drug use: No   Sexual activity: Not Currently  Other Topics  Concern   Not on file  Social History Narrative   Lives with husband.  Ambulated independently.   Social Determinants of Health   Financial Resource Strain: Low Risk    Difficulty of Paying Living Expenses: Not very hard  Food Insecurity:    Worried About Charity fundraiser in the Last Year: Not on file   [employer] of Food in the Last Year: Not on file  Transportation Needs: No Transportation Needs   Lack of Transportation (Medical): No   Lack of Transportation (Non-Medical): No  Physical Activity:    Days of Exercise per Week: Not on file   Minutes of Exercise per Session: Not on file  Stress:    Feeling of Stress : Not on file  Social Connections:    Frequency of Communication with Friends and Family: Not on file   Frequency of Social Gatherings with Friends and Family: Not on file  Attends Religious Services: Not on file   Active Member of Clubs or Organizations: Not on file   Attends Archivist Meetings: Not on file   Marital Status: Not on file     Family History:  The patient's family history includes Diabetes in her father, mother, sister, and sister; HIV in her brother; Heart attack (age of onset: 62) in her mother; Lung cancer in her father; Thyroid disease in her sister.  ROS:   Review of Systems  Constitutional: Positive for malaise/fatigue. Negative for chills, diaphoresis, fever and weight loss.  HENT: Negative for congestion.   Eyes: Negative for discharge and redness.  Respiratory: Positive for shortness of breath. Negative for cough, sputum production and wheezing.   Cardiovascular: Negative for chest pain, palpitations, orthopnea, claudication, leg swelling and PND.  Gastrointestinal: Negative for abdominal pain, blood in stool, heartburn, melena, nausea and vomiting.  Musculoskeletal: Positive for back pain and joint pain. Negative for falls and myalgias.  Skin: Negative for rash.  Neurological: Negative for dizziness, tingling,  tremors, sensory change, speech change, focal weakness, loss of consciousness and weakness.  Endo/Heme/Allergies: Does not bruise/bleed easily.  Psychiatric/Behavioral: Negative for substance abuse. The patient is not nervous/anxious.   All other systems reviewed and are negative.    EKGs/Labs/Other Studies Reviewed:    Studies reviewed were summarized above. The additional studies were reviewed today: As above.  EKG:  EKG is ordered today.  The EKG ordered today demonstrates NSR, 80 bpm, LVH, poor R wave progression along the precordial leads, nonspecific lateral ST-T changes, largely unchanged compared to prior trace  Recent Labs: 01/29/2019: TSH 0.55 11/22/2019: ALT 9; BUN 12; Creatinine 1.16; Hemoglobin 12.1; Platelet Count 92; Potassium 4.6; Sodium 140  Recent Lipid Panel    Component Value Date/Time   CHOL 161 01/29/2019 1232   TRIG 77.0 01/29/2019 1232   HDL 64.90 01/29/2019 1232   CHOLHDL 2 01/29/2019 1232   VLDL 15.4 01/29/2019 1232   LDLCALC 81 01/29/2019 1232    PHYSICAL EXAM:    VS:  BP 100/68 (BP Location: Right Arm, Patient Position: Sitting, Cuff Size: Large)    Pulse 80    Ht 5\' 7"  (1.702 m)    Wt 257 lb (116.6 kg)    SpO2 98%    BMI 40.25 kg/m   BMI: Body mass index is 40.25 kg/m.  Physical Exam Constitutional:      Appearance: She is well-developed.  HENT:     Head: Normocephalic and atraumatic.  Eyes:     General:        Right eye: No discharge.        Left eye: No discharge.  Neck:     Vascular: No JVD.  Cardiovascular:     Rate and Rhythm: Normal rate and regular rhythm.     Pulses: No midsystolic click and no opening snap.          Dorsalis pedis pulses are 2+ on the right side and 2+ on the left side.       Posterior tibial pulses are 2+ on the right side and 2+ on the left side.     Heart sounds: S1 normal and S2 normal. Heart sounds not distant. Murmur heard.  Systolic murmur is present with a grade of 1/6.  No friction rub.  Pulmonary:      Effort: Pulmonary effort is normal. No respiratory distress.     Breath sounds: Normal breath sounds. No decreased breath sounds, wheezing or rales.  Chest:  Chest wall: No tenderness.  Abdominal:     General: There is no distension.     Palpations: Abdomen is soft.     Tenderness: There is no abdominal tenderness.  Musculoskeletal:     Cervical back: Normal range of motion.  Skin:    General: Skin is warm and dry.     Nails: There is no clubbing.  Neurological:     Mental Status: She is alert and oriented to person, place, and time.  Psychiatric:        Speech: Speech normal.        Behavior: Behavior normal.        Thought Content: Thought content normal.        Judgment: Judgment normal.     Wt Readings from Last 3 Encounters:  11/28/19 257 lb (116.6 kg)  11/22/19 257 lb 4.8 oz (116.7 kg)  11/15/19 258 lb 3.2 oz (117.1 kg)     ASSESSMENT & PLAN:   1. HFrEF secondary to NICM: Volume status is somewhat difficult to assess secondary to body habitus though she appears euvolemic and well compensated.  Weight has remained stable.  She has NYHA class III symptoms.  Her functional status is limited from both cardiac and noncardiac etiologies including recent hip fracture, morbid obesity, and physical deconditioning.  She remains on Lasix 20 mg daily as well as carvedilol and Entresto.  Relative hypotension precludes escalation of GDMT.  She is not on spironolactone given history of hyperkalemia.  Recent echo earlier this month demonstrated improvement in LV systolic function.  Given this, and in the setting of stable symptoms R/LHC is deferred at this time with recommendation for patient to resume PT and reassess dyspnea thereafter.  Check BMP today.  CHF education  2. PAF: She is maintaining sinus rhythm.  Continue rate control with carvedilol.  She remains on Coumadin with INR monitoring through her PCPs office.  No symptoms concerning for bleeding.  Recent hemoglobin stable.   CHA2DS2-VASc at least 6.  3. Valvular heart disease: She remains on aspirin and warfarin.  SBE prophylaxis for dental procedures is recommended.  Aortic valve bioprosthesis was functioning appropriately on echo earlier this month.  4. HTN: Blood pressure is well controlled in the office today.  She remains on carvedilol, Entresto, and furosemide.  5. HLD: LDL 81 from 12/2018 with normal LFT in 04/2019.  She remains on Crestor.  6. Morbid obesity/physical deconditioning: Weight loss is recommended though limited by mobility and chronic pain.  Obesity and physical deconditioning are likely contributing to her overall presentation.  Disposition: F/u with Dr. Saunders Revel or an APP in 3 months.   Medication Adjustments/Labs and Tests Ordered: Current medicines are reviewed at length with the patient today.  Concerns regarding medicines are outlined above. Medication changes, Labs and Tests ordered today are summarized above and listed in the Patient Instructions accessible in Encounters.   Signed, Christell Faith, PA-C 11/28/2019 11:08 AM     [facility] [address] [place] [address] [place], [place] [address] [phone]

## 2019-11-20 NOTE — Telephone Encounter (Signed)
These changes were discussed at her visit so we are ok from that perspective. If she does not show for her follow up BMET this week, we can send an unable to reach letter.

## 2019-11-20 NOTE — Telephone Encounter (Signed)
Unable to reach patient or husband. Left message to call back to go over results and recommendations.  Routing to [place] to make him aware we have not reached patient at this time and to make sure changes are still relevant.

## 2019-11-21 ENCOUNTER — Other Ambulatory Visit: Payer: Self-pay

## 2019-11-21 ENCOUNTER — Ambulatory Visit (INDEPENDENT_AMBULATORY_CARE_PROVIDER_SITE_OTHER): Payer: Medicare Other

## 2019-11-21 DIAGNOSIS — I5043 Acute on chronic combined systolic (congestive) and diastolic (congestive) heart failure: Secondary | ICD-10-CM

## 2019-11-21 DIAGNOSIS — I428 Other cardiomyopathies: Secondary | ICD-10-CM | POA: Diagnosis not present

## 2019-11-21 LAB — ECHOCARDIOGRAM COMPLETE
AR max vel: 1.5 cm2
AV Area VTI: 1.32 cm2
AV Area mean vel: 1.26 cm2
AV Mean grad: 9.7 mmHg
AV Peak grad: 18 mmHg
Ao pk vel: 2.12 m/s
S' Lateral: 3.4 cm

## 2019-11-21 MED ORDER — PERFLUTREN LIPID MICROSPHERE
1.0000 mL | INTRAVENOUS | Status: AC | PRN
  Administered 2019-11-21: 2 mL via INTRAVENOUS

## 2019-11-21 NOTE — Progress Notes (Signed)
[facility]   [phone] [phone]   Clinic Follow up Note   Patient Care Team: [name], [name], MD as PCP - General (Family Medicine) End, Harrell Gave, MD as PCP - Cardiology (Cardiology) [name], MD as Consulting Physician (Hematology) Kyung Rudd, MD as Consulting Physician (Radiation Oncology) Rolm Bookbinder, MD as Consulting Physician (General Surgery) Delice Bison, Charlestine Massed, NP as Nurse Practitioner (Hematology and Oncology) Earnie Larsson, [facility] as Pharmacist (Pharmacist)  Date of Service:  11/22/2019  CHIEF COMPLAINT: F/u of left breast cancer  SUMMARY OF ONCOLOGIC HISTORY: Oncology History Overview Note  Cancer Staging Malignant neoplasm of upper-outer quadrant of left breast in female, estrogen receptor positive ([place]) Staging form: Breast, AJCC 8th Edition - Clinical stage from 12/13/2016: Stage IB (cT2, cN0, cM0, G2, ER: Positive, PR: Positive, HER2: Negative) - Signed by [name], MD on 12/21/2016 - Pathologic stage from 01/14/2017: Stage IA (pT2, pN0, cM0, G1, ER: Positive, PR: Positive, HER2: Negative, Oncotype DX score: 4) - Signed by [name], MD on 04/17/2017     Malignant neoplasm of upper-outer quadrant of left breast in female, estrogen receptor positive ([place])  12/06/2016 Mammogram   Diagnostic Mammogram 12/06/16 IMPRESSION:  Suspicious mass in the left breast at 3 o'clock 2 cm from the nipple measuring 1.9 x 1.1 x 2.2 cm. RECOMMENDATION: Ultrasound-guided core biopsy of the mass in the 3 o'clock region of the left breast is recommended. The biopsy will be scheduled at the patient's convenience.   12/13/2016 Initial Biopsy   Diagnosis 12/13/16 Breast, left, needle core biopsy, 3:00 o'clock, 2cmfn - INVASIVE DUCTAL CARCINOMA - SEE COMMENT   12/16/2016 Initial Diagnosis   Malignant neoplasm of upper-outer quadrant of left breast in female, estrogen receptor positive ([place])   12/17/2016 Receptors her2    Estrogen Receptor: 100%, POSITIVE, STRONG STAINING INTENSITY Progesterone Receptor: 100%, POSITIVE, STRONG STAINING INTENSITY Proliferation Marker Ki67: 30% HER2 - NEGATIVE    01/14/2017 Surgery   Left breast lumpectomy with Dr. Donne Hazel   01/14/2017 Pathology Results   Diagnosis 01/14/17 1. Breast, lumpectomy, Left - INVASIVE DUCTAL CARCINOMA, GRADE I/III, SPANNING 2.1 CM. - DUCTAL CARCINOMA IN SITU, LOW GRADE. - INVASIVE CARCINOMA IS BROADLY PRESENT AT THE INFERIOR MARGIN OF SPECIMEN 1. - DUCTAL CARCINOMA IN SITU IS FOCALLY PRESENT AT THE INFERIOR MARGIN OF SPECIMEN 1 AND BROADLY LESS THAN 0.1 CM TO THE LATERAL MARGIN OF SPECIMEN 1. - SEE ONCOLOGY TABLE BELOW. 2. Breast, excision, Additional medial margin left - DUCTAL CARCINOMA IN SITU, LOW GRADE. - DUCTAL CARCINOMA IS FOCALLY LESS THAN 0.1 CM TO THE NEW MARGIN OF SPECIMEN 2. 3. Breast, excision, Additional lateral margin left - DUCTAL CARCINOMA IN SITU, LOW GRADE. - DUCTAL CARCINOMA IN SITU IS GREATER THAN 0.2 CM TO ALL MARGINS. 4. Breast, excision, Additional superior margin left - DUCTAL CARCINOMA IN SITU, LOW GRADE. - DUCTAL CARCINOMA IN SITU IS BROADLY LESS THAN 0.1 CM TO THE NEW MARGIN OF SPECIMEN 4. 5. Lymph node, sentinel, biopsy, Left axillary - THERE IS NO EVIDENCE OF CARCINOMA IN 1 OF 1 LYMPH NODE (0/1). 6. Breast, excision, Additional inferior margin left - DUCTAL CARCINOMA IN SITU, LOW GRADE. - DUCTAL CARCINOMA IN SITU IS GREATER THAN 0.2 CM TO ALL MARGINS.    01/14/2017 Oncotype testing   Her oncotype recurrence score is 4 and her distance recurrent on Tamoxifen alone is 3%.   03/08/2017 - 04/05/2017 Radiation Therapy   RT with Dr. Lisbeth Renshaw    06/2017 -  Anti-estrogen oral therapy  Tamoxifen daily   12/15/2017 Mammogram   12/15/2017 Mammogram IMPRESSION: New lumpectomy site left breast. No mammographic evidence of malignancy in either breast.      CURRENT THERAPY:  Adjuvant Tamoxifen 20 mg daily started  on 04/20/17  INTERVAL HISTORY:  Danielle [name] is here for a follow up of left breast cancer. She was last seen by me 6 months ago. She presents to the clinic with her husband.  She has been under a lot of stress lately due to her sisters poor health.  She otherwise is clinically stable.  Her recent echocardiogram showed improved heart function, she saw her cardiologist yesterday.  Her dyspnea on exertion is stable, she ambulates with a walker.  She is not very active, due to her obstructive.  She denies any new pain, or GI symptoms.  Review of system otherwise negative.   MEDICAL HISTORY:  Past Medical History:  Diagnosis Date   Allergy    Anxiety    Aortic stenosis    Status post bioprosthetic AVR   Arthritis    DJD   Asthma    Breast cancer ([place]) 12/13/2016   Left breast   Chronic systolic CHF (congestive heart failure) (HCC)    EF 30% 04/2014   COPD (chronic obstructive pulmonary disease) (HCC)    Coronary artery disease    per pt, had LHC prior to AVR in [place] 2016 that did not show any blockages; no stents/bypass   Gastritis    Hyperlipidemia    Hypertension    Hyperthyroidism    Multiple thyroid nodules    Osteoporosis    Paroxysmal atrial fibrillation ([place])    Pelvis fracture ([place]) 08/19/2015   MULTIPLE    Personal history of radiation therapy 2018   Spontaneous pneumothorax 11/28/2015   left    Stroke ([place]) 2000   rt hand weak    SURGICAL HISTORY: Past Surgical History:  Procedure Laterality Date   BREAST LUMPECTOMY Left 12/2016   BREAST LUMPECTOMY WITH RADIOACTIVE SEED AND SENTINEL LYMPH NODE BIOPSY Left 01/14/2017   Procedure: LEFT BREAST LUMPECTOMY WITH RADIOACTIVE SEED AND SENTINEL LYMPH NODE BIOPSY;  Surgeon: Rolm Bookbinder, MD;  Location: [facility];  Service: General;  Laterality: Left;   CHEST TUBE INSERTION  10/2015   INTRAMEDULLARY (IM) NAIL INTERTROCHANTERIC Left 12/10/2015   Procedure: INTRAMEDULLARY (IM) NAIL  INTERTROCHANTRIC;  Surgeon: Leandrew Koyanagi, MD;  Location: [facility];  Service: Orthopedics;  Laterality: Left;   PLEURADESIS Left 12/03/2015   Procedure: PLEURADESIS;  Surgeon: Melrose Nakayama, MD;  Location: [facility];  Service: Thoracic;  Laterality: Left;   RESECTION OF APICAL BLEB Left 12/03/2015   Procedure: BLEBECTOMY;  Surgeon: Melrose Nakayama, MD;  Location: [facility];  Service: Thoracic;  Laterality: Left;   THORACOSCOPY  12/03/2015   VALVE REPLACEMENT     VIDEO ASSISTED THORACOSCOPY Left 12/03/2015   Procedure: VIDEO ASSISTED THORACOSCOPY;  Surgeon: Melrose Nakayama, MD;  Location: [facility];  Service: Thoracic;  Laterality: Left;    I have reviewed the social history and family history with the patient and they are unchanged from previous note.  ALLERGIES:  is allergic to lisinopril, tetanus toxoid adsorbed, and other.  MEDICATIONS:  Current Outpatient Medications  Medication Sig Dispense Refill   acetaminophen (TYLENOL) 500 MG tablet Take 1,000 mg by mouth daily as needed (PAIN).     albuterol (PROVENTIL HFA;VENTOLIN HFA) 108 (90 Base) MCG/ACT inhaler Inhale 2 puffs into the lungs every 6 (six) hours as needed for wheezing or shortness  of breath.      alendronate (FOSAMAX) 70 MG tablet Take 1 tablet (70 mg total) by mouth once a week. Take with a full glass of water on an empty stomach. 13 tablet 3   aspirin EC 81 MG tablet Take 81 mg daily by mouth.     budesonide-formoterol (SYMBICORT) 160-4.5 MCG/ACT inhaler Inhale 2 puffs into the lungs 2 (two) times daily. 1 Inhaler 0   carvedilol (COREG) 25 MG tablet Take 1 tablet (25 mg total) by mouth 2 (two) times daily. 180 tablet 0   Cholecalciferol (VITAMIN D3) 400 units tablet Take 1 tablet (400 Units total) by mouth daily. (Patient taking differently: Take 1,000 Units by mouth daily. ) 30 tablet 0   diazepam (VALIUM) 5 MG tablet Take 5 mg by mouth once.     diclofenac sodium (VOLTAREN) 1 % GEL Apply 4 g topically 4 (four)  times daily. 500 g 3   diphenhydramine-acetaminophen (TYLENOL PM) 25-500 MG TABS tablet Take 2 tablets by mouth at bedtime.      furosemide (LASIX) 20 MG tablet Take 2 tablets (40 mg) by mouth once a day for 1 week, then decrease to 1 tablet (20 mg) by mouth once a day thereafter. 90 tablet 1   MELATONIN PO Take 1 tablet by mouth at bedtime.     methimazole (TAPAZOLE) 5 MG tablet TAKE 3 TABLETS BY MOUTH IN THE EVENING . APPOINTMENT REQUIRED FOR FUTURE REFILLS 180 tablet 0   rosuvastatin (CRESTOR) 40 MG tablet Take 1 tablet (40 mg total) by mouth daily. 90 tablet 1   sacubitril-valsartan (ENTRESTO) 49-51 MG Take 1 tablet by mouth 2 (two) times daily. 180 tablet 0   senna-docusate (SENOKOT-S) 8.6-50 MG tablet Take 2 tablets at bedtime by mouth.      tamoxifen (NOLVADEX) 20 MG tablet Take 1 tablet by mouth once daily 90 tablet 2   traMADol (ULTRAM) 50 MG tablet Take 1 tablet (50 mg total) by mouth 3 (three) times daily. 90 tablet 5   trolamine salicylate (ASPERCREME) 10 % cream Apply 1 application topically as needed for muscle pain.     valACYclovir (VALTREX) 500 MG tablet Take 1 tablet (500 mg total) by mouth 2 (two) times daily. For 3 days when outbreaks. 24 tablet 1   warfarin (COUMADIN) 1 MG tablet As directed by anticoagulation clinic 12 tablet 2   warfarin (COUMADIN) 3 MG tablet TAKE AS DIRECTED BY ANTICOAGULATION CLINIC 90 tablet 2   No current facility-administered medications for this visit.    PHYSICAL EXAMINATION: ECOG PERFORMANCE STATUS: 3 - Symptomatic, >50% confined to bed  Vitals:   11/22/19 1137  BP: 114/70  Pulse: 72  Resp: 19  Temp: 97.8 F (36.6 C)  SpO2: 95%   Filed Weights   11/22/19 1137  Weight: 257 lb 4.8 oz (116.7 kg)    GENERAL:alert, no distress and comfortable SKIN: skin color, texture, turgor are normal, no rashes or significant lesions EYES: normal, Conjunctiva are pink and non-injected, sclera clear NECK: supple, thyroid normal size,  non-tender, without nodularity LYMPH:  no palpable lymphadenopathy in the cervical, axillary  LUNGS: clear to auscultation and percussion with normal breathing effort HEART: regular rate & rhythm and no murmurs and no lower extremity edema ABDOMEN:abdomen soft, non-tender and normal bowel sounds Musculoskeletal:no cyanosis of digits and no clubbing  NEURO: alert & oriented x 3 with fluent speech, no focal motor/sensory deficits Breasts: Breast inspection showed them to be symmetrical with no nipple discharge.  Diffuse skin pigmentation  of left breast.  Palpation of the breasts and axilla revealed no obvious mass that I could appreciate.   LABORATORY DATA:  I have reviewed the data as listed CBC Latest Ref Rng & Units 11/22/2019 11/15/2019 05/21/2019  WBC 4.0 - 10.5 K/uL 5.1 5.8 4.3  Hemoglobin 12.0 - 15.0 g/dL 12.1 12.4 12.8  Hematocrit 36 - 46 % 37.6 38.8 40.0  Platelets 150 - 400 K/uL 92(L) 100(L) 94(L)     CMP Latest Ref Rng & Units 11/22/2019 11/15/2019 06/28/2019  Glucose 70 - 99 mg/dL 104(H) 104(H) 89  BUN 8 - 23 mg/dL '12 13 14  ' Creatinine 0.44 - 1.00 mg/dL 1.16(H) 1.26(H) 1.13(H)  Sodium 135 - 145 mmol/L 140 139 142  Potassium 3.5 - 5.1 mmol/L 4.6 4.9 5.2  Chloride 98 - 111 mmol/L 110 106 108(H)  CO2 22 - 32 mmol/L '26 25 21  ' Calcium 8.9 - 10.3 mg/dL 9.6 9.6 9.6  Total Protein 6.5 - 8.1 g/dL 6.9 - -  Total Bilirubin 0.3 - 1.2 mg/dL 0.4 - -  Alkaline Phos 38 - 126 U/L 53 - -  AST 15 - 41 U/L 12(L) - -  ALT 0 - 44 U/L 9 - -      RADIOGRAPHIC STUDIES: I have personally reviewed the radiological images as listed and agreed with the findings in the report. ECHOCARDIOGRAM COMPLETE  Result Date: 11/21/2019    ECHOCARDIOGRAM REPORT   Patient Name:   ZANETTA DEHAAN Date of Exam: 11/21/2019 Medical Rec #:  [id number]                   Height:       67.0 in Accession #:    [id number]                  Weight:       258.2 lb Date of Birth:  1946/01/23                  BSA:           2.254 m Patient Age:    74 years                    BP:           118/82 mmHg Patient Gender: F                           HR:           80 bpm. Exam Location:  [facility] Procedure: 2D Echo, Cardiac Doppler, Color Doppler and Intracardiac            Opacification Agent Indications:    I35.8 Other nonrheumatic aortic valve disorders  History:        Patient has prior history of Echocardiogram examinations, most                 recent 01/18/2019. Cardiomyopathy and CHF, CAD, COPD and Stroke,                 Aortic Valve Disease, Arrythmias:Atrial Fibrillation; Risk                 Factors:Former Smoker, Hypertension and Dyslipidemia.  Sonographer:    Pilar Jarvis RDMS, RVT, RDCS Referring Phys: [id number] Rise Mu  Sonographer Comments: Technically difficult study due to poor echo windows and patient is morbidly obese. Definity helped images somewhat IMPRESSIONS  1. Porcine AVR, Procedure Date: 2015.  2. Left ventricular ejection fraction, by estimation, is 45 to 50%. The left ventricle has mildly decreased function. The left ventricle demonstrates global hypokinesis. There is mild left ventricular hypertrophy. Left ventricular diastolic function could not be evaluated.  3. Right ventricular systolic function is normal. The right ventricular size is normal.  4. The mitral valve is grossly normal. No evidence of mitral valve regurgitation. FINDINGS  Left Ventricle: Left ventricular ejection fraction, by estimation, is 45 to 50%. The left ventricle has mildly decreased function. The left ventricle demonstrates global hypokinesis. Definity contrast agent was given IV to delineate the left ventricular  endocardial borders. The left ventricular internal cavity size was normal in size. There is mild left ventricular hypertrophy. Left ventricular diastolic function could not be evaluated. Right Ventricle: The right ventricular size is normal. No increase in right ventricular wall thickness. Right ventricular systolic  function is normal. Left Atrium: Left atrial size was normal in size. Right Atrium: Right atrial size was not well visualized. Pericardium: There is no evidence of pericardial effusion. Mitral Valve: The mitral valve is grossly normal. No evidence of mitral valve regurgitation. Tricuspid Valve: The tricuspid valve is not well visualized. Tricuspid valve regurgitation is not demonstrated. Aortic Valve: The aortic valve was not well visualized. Aortic valve regurgitation is not visualized. Aortic valve mean gradient measures 9.7 mmHg. Aortic valve peak gradient measures 18.0 mmHg. Aortic valve area, by VTI measures 1.32 cm. There is a porcine valve present in the aortic position. Pulmonic Valve: The pulmonic valve was not well visualized. Pulmonic valve regurgitation is not visualized. Aorta: The aortic root is normal in size and structure. Venous: The inferior vena cava was not well visualized. IAS/Shunts: The interatrial septum was not assessed.  LEFT VENTRICLE PLAX 2D LVIDd:         4.00 cm LVIDs:         3.40 cm LV PW:         1.20 cm LV IVS:        1.20 cm LVOT diam:     2.20 cm LV SV:         53 LV SV Index:   24 LVOT Area:     3.80 cm  LEFT ATRIUM         Index LA diam:    4.10 cm 1.82 cm/m  AORTIC VALVE AV Area (Vmax):    1.50 cm AV Area (Vmean):   1.26 cm AV Area (VTI):     1.32 cm AV Vmax:           212.33 cm/s AV Vmean:          142.333 cm/s AV VTI:            0.403 m AV Peak Grad:      18.0 mmHg AV Mean Grad:      9.7 mmHg LVOT Vmax:         83.60 cm/s LVOT Vmean:        47.000 cm/s LVOT VTI:          0.140 m LVOT/AV VTI ratio: 0.35  AORTA Ao Root diam: 3.40 cm Ao Arch diam: 2.7 cm  SHUNTS Systemic VTI:  0.14 m Systemic Diam: 2.20 cm Kate Sable MD Electronically signed by Kate Sable MD Signature Date/Time: 11/21/2019/4:11:17 PM    Final      ASSESSMENT & PLAN:  Danielle [name] is a 74 y.o. female with    1. Malignant neoplasm of upper-outer quadrant of left breast,  Stage  IB, c(T2,N0,M0 ), ER/PR: POSITIVE, HER2: NEGATIVE, Grade II -She was diagnosed in 11/2016. She is s/p left breastlumpectomyand adjuvant radiation.  -Based on Oncotype Dx she has low risk based on the recurrence score, chemo was not recommended  -She started Tamoxifen in 06/2017. Tolerating well, will continue for 5 to 10 years.Due to her severe arthritis, she may not be able to tolerate aromatase inhibitor. -Given mild drug interaction with Tamoxifen she will continue f/u with PCP for PT/INR check monthly.  -She is clinically stable, lab reviewed, her creatinine and mild thrombocytopenia are stable.  Exam was unremarkable, no clinical concern for recurrence -Continue breast cancer surveillance, next mammogram next month   2. 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 Osteoporosis, Arthritis with back pain  -Underwent Left Hip surgery with rod placement in 2017, ambulates with walker -Her 10/2018 DEXA shows osteoporosis with lowest T-score -2.8. Repeat in 10/2020.  -On Fosamax -Arthritis with chronic back pain. She will continues to f/u with pain specialist for possible injections.   4. CAD, Afib, CHF, Aortic Stenosis, H/o Stroke -Continue medications and f/u with PCP and cardiologist.   5. Mild Thrombocytopenia,likelyITP  -Per pt she did have Platelet transfusion with her prior heart surgery.  -At this mild level, there is no indication for treatment. Will monitor.she is fine to continue Coumadin.  -Moderate and stable.    PLAN: -She is clinically doing well -Continue Tamoxifen  -Mammogram  scheduled for December 27, 2019 -lab and f/u with APP in 6 months    No problem-specific Assessment & Plan notes found for this encounter.   No orders of the defined types were placed in this encounter.  All questions were answered. The patient knows to call the clinic with any problems, questions or concerns. No barriers to learning was detected. The  total time spent in the appointment was 25 minutes.     [name], MD 11/22/2019   I, Joslyn Devon, am acting as scribe for [name], MD.   I have reviewed the above documentation for accuracy and completeness, and I agree with the above.

## 2019-11-22 ENCOUNTER — Encounter: Payer: Self-pay | Admitting: Hematology

## 2019-11-22 ENCOUNTER — Inpatient Hospital Stay: Payer: Medicare Other | Attending: Hematology | Admitting: Hematology

## 2019-11-22 ENCOUNTER — Telehealth: Payer: Self-pay

## 2019-11-22 ENCOUNTER — Inpatient Hospital Stay: Payer: Medicare Other

## 2019-11-22 ENCOUNTER — Other Ambulatory Visit: Payer: Self-pay

## 2019-11-22 VITALS — BP 114/70 | HR 72 | Temp 97.8°F | Resp 19 | Ht 67.0 in | Wt 257.3 lb

## 2019-11-22 DIAGNOSIS — Z7951 Long term (current) use of inhaled steroids: Secondary | ICD-10-CM | POA: Diagnosis not present

## 2019-11-22 DIAGNOSIS — C50212 Malignant neoplasm of upper-inner quadrant of left female breast: Secondary | ICD-10-CM | POA: Insufficient documentation

## 2019-11-22 DIAGNOSIS — Z79810 Long term (current) use of selective estrogen receptor modulators (SERMs): Secondary | ICD-10-CM | POA: Diagnosis not present

## 2019-11-22 DIAGNOSIS — Z923 Personal history of irradiation: Secondary | ICD-10-CM | POA: Diagnosis not present

## 2019-11-22 DIAGNOSIS — M810 Age-related osteoporosis without current pathological fracture: Secondary | ICD-10-CM | POA: Insufficient documentation

## 2019-11-22 DIAGNOSIS — Z7901 Long term (current) use of anticoagulants: Secondary | ICD-10-CM | POA: Insufficient documentation

## 2019-11-22 DIAGNOSIS — E785 Hyperlipidemia, unspecified: Secondary | ICD-10-CM | POA: Diagnosis not present

## 2019-11-22 DIAGNOSIS — I350 Nonrheumatic aortic (valve) stenosis: Secondary | ICD-10-CM | POA: Diagnosis not present

## 2019-11-22 DIAGNOSIS — Z8781 Personal history of (healed) traumatic fracture: Secondary | ICD-10-CM | POA: Diagnosis not present

## 2019-11-22 DIAGNOSIS — C50412 Malignant neoplasm of upper-outer quadrant of left female breast: Secondary | ICD-10-CM

## 2019-11-22 DIAGNOSIS — Z7982 Long term (current) use of aspirin: Secondary | ICD-10-CM | POA: Insufficient documentation

## 2019-11-22 DIAGNOSIS — Z8673 Personal history of transient ischemic attack (TIA), and cerebral infarction without residual deficits: Secondary | ICD-10-CM | POA: Diagnosis not present

## 2019-11-22 DIAGNOSIS — Z8719 Personal history of other diseases of the digestive system: Secondary | ICD-10-CM | POA: Insufficient documentation

## 2019-11-22 DIAGNOSIS — D696 Thrombocytopenia, unspecified: Secondary | ICD-10-CM | POA: Diagnosis not present

## 2019-11-22 DIAGNOSIS — I480 Paroxysmal atrial fibrillation: Secondary | ICD-10-CM | POA: Insufficient documentation

## 2019-11-22 DIAGNOSIS — F419 Anxiety disorder, unspecified: Secondary | ICD-10-CM | POA: Insufficient documentation

## 2019-11-22 DIAGNOSIS — Z79899 Other long term (current) drug therapy: Secondary | ICD-10-CM | POA: Diagnosis not present

## 2019-11-22 DIAGNOSIS — Z170 Estrogen receptor positive status [ER+]: Secondary | ICD-10-CM | POA: Insufficient documentation

## 2019-11-22 DIAGNOSIS — I2510 Atherosclerotic heart disease of native coronary artery without angina pectoris: Secondary | ICD-10-CM | POA: Diagnosis not present

## 2019-11-22 DIAGNOSIS — I110 Hypertensive heart disease with heart failure: Secondary | ICD-10-CM | POA: Diagnosis not present

## 2019-11-22 DIAGNOSIS — J449 Chronic obstructive pulmonary disease, unspecified: Secondary | ICD-10-CM | POA: Diagnosis not present

## 2019-11-22 DIAGNOSIS — E0590 Thyrotoxicosis, unspecified without thyrotoxic crisis or storm: Secondary | ICD-10-CM | POA: Insufficient documentation

## 2019-11-22 LAB — CBC WITH DIFFERENTIAL (CANCER CENTER ONLY)
Abs Immature Granulocytes: 0.02 10*3/uL (ref 0.00–0.07)
Basophils Absolute: 0 10*3/uL (ref 0.0–0.1)
Basophils Relative: 1 %
Eosinophils Absolute: 0.4 10*3/uL (ref 0.0–0.5)
Eosinophils Relative: 7 %
HCT: 37.6 % (ref 36.0–46.0)
Hemoglobin: 12.1 g/dL (ref 12.0–15.0)
Immature Granulocytes: 0 %
Lymphocytes Relative: 25 %
Lymphs Abs: 1.3 10*3/uL (ref 0.7–4.0)
MCH: 31.2 pg (ref 26.0–34.0)
MCHC: 32.2 g/dL (ref 30.0–36.0)
MCV: 96.9 fL (ref 80.0–100.0)
Monocytes Absolute: 0.5 10*3/uL (ref 0.1–1.0)
Monocytes Relative: 9 %
Neutro Abs: 2.9 10*3/uL (ref 1.7–7.7)
Neutrophils Relative %: 58 %
Platelet Count: 92 10*3/uL — ABNORMAL LOW (ref 150–400)
RBC: 3.88 MIL/uL (ref 3.87–5.11)
RDW: 14.2 % (ref 11.5–15.5)
WBC Count: 5.1 10*3/uL (ref 4.0–10.5)
nRBC: 0 % (ref 0.0–0.2)

## 2019-11-22 LAB — CMP (CANCER CENTER ONLY)
ALT: 9 U/L (ref 0–44)
AST: 12 U/L — ABNORMAL LOW (ref 15–41)
Albumin: 3.5 g/dL (ref 3.5–5.0)
Alkaline Phosphatase: 53 U/L (ref 38–126)
Anion gap: 4 — ABNORMAL LOW (ref 5–15)
BUN: 12 mg/dL (ref 8–23)
CO2: 26 mmol/L (ref 22–32)
Calcium: 9.6 mg/dL (ref 8.9–10.3)
Chloride: 110 mmol/L (ref 98–111)
Creatinine: 1.16 mg/dL — ABNORMAL HIGH (ref 0.44–1.00)
GFR, Est AFR Am: 54 mL/min — ABNORMAL LOW (ref >60)
GFR, Estimated: 47 mL/min — ABNORMAL LOW (ref >60)
Glucose, Bld: 104 mg/dL — ABNORMAL HIGH (ref 70–99)
Potassium: 4.6 mmol/L (ref 3.5–5.1)
Sodium: 140 mmol/L (ref 135–145)
Total Bilirubin: 0.4 mg/dL (ref 0.3–1.2)
Total Protein: 6.9 g/dL (ref 6.5–8.1)

## 2019-11-22 NOTE — Telephone Encounter (Signed)
Attempted to call patient. [facility] 11/22/2019

## 2019-11-22 NOTE — Telephone Encounter (Signed)
-----   Message from Danielle [name], [place] sent at 11/21/2019  4:22 PM EDT ----- Echo showed mildly reduced, though improved, pump function of 45-50%, mild thickening of the heart, and the aortic valve appears to be functioning normally. Compared to prior echo, her pump function has improved some (prior 35-40%). This is good news. Continue current medications and follow up as previously planned.

## 2019-11-23 ENCOUNTER — Telehealth: Payer: Self-pay | Admitting: Hematology

## 2019-11-23 NOTE — Telephone Encounter (Signed)
Scheduled per 9/23 los. Pt is aware of appt times and date.

## 2019-11-26 ENCOUNTER — Other Ambulatory Visit: Payer: Self-pay

## 2019-11-26 ENCOUNTER — Ambulatory Visit (INDEPENDENT_AMBULATORY_CARE_PROVIDER_SITE_OTHER): Payer: Medicare Other | Admitting: General Practice

## 2019-11-26 DIAGNOSIS — Z7901 Long term (current) use of anticoagulants: Secondary | ICD-10-CM | POA: Diagnosis not present

## 2019-11-26 LAB — POCT INR: INR: 3.8 — AB (ref 2.0–3.0)

## 2019-11-26 NOTE — Patient Instructions (Addendum)
Pre visit review using our clinic review tool, if applicable. No additional management support is needed unless otherwise documented below in the visit note.  Skip dosage today and then continue to take 1 (3 mg) tablet daily.  Re-check in 3 weeks.

## 2019-11-26 NOTE — Telephone Encounter (Signed)
No answer. Left message to call back.   

## 2019-11-27 ENCOUNTER — Encounter: Payer: Self-pay | Admitting: *Deleted

## 2019-11-27 NOTE — Telephone Encounter (Signed)
3rd attempt made to contact the patient with results. Lmtcb. Patient has an appt scheduled on 11/28/19 with Christell Faith, PA

## 2019-11-27 NOTE — Telephone Encounter (Signed)
Letter with results mailed to patient.

## 2019-11-28 ENCOUNTER — Other Ambulatory Visit: Payer: Self-pay

## 2019-11-28 ENCOUNTER — Encounter: Payer: Self-pay | Admitting: Physician Assistant

## 2019-11-28 ENCOUNTER — Ambulatory Visit: Payer: Medicare Other | Admitting: Physician Assistant

## 2019-11-28 ENCOUNTER — Telehealth: Payer: Self-pay

## 2019-11-28 ENCOUNTER — Other Ambulatory Visit
Admission: RE | Admit: 2019-11-28 | Discharge: 2019-11-28 | Disposition: A | Discharge status: Home / Self Care | Payer: Medicare Other | Source: Ambulatory Visit | Attending: Physician Assistant | Admitting: Physician Assistant

## 2019-11-28 VITALS — BP 100/68 | HR 80 | Ht 67.0 in | Wt 257.0 lb

## 2019-11-28 DIAGNOSIS — Z952 Presence of prosthetic heart valve: Secondary | ICD-10-CM | POA: Diagnosis not present

## 2019-11-28 DIAGNOSIS — Z79899 Other long term (current) drug therapy: Secondary | ICD-10-CM | POA: Insufficient documentation

## 2019-11-28 DIAGNOSIS — I428 Other cardiomyopathies: Secondary | ICD-10-CM | POA: Insufficient documentation

## 2019-11-28 DIAGNOSIS — I10 Essential (primary) hypertension: Secondary | ICD-10-CM | POA: Diagnosis not present

## 2019-11-28 DIAGNOSIS — I480 Paroxysmal atrial fibrillation: Secondary | ICD-10-CM

## 2019-11-28 DIAGNOSIS — I38 Endocarditis, valve unspecified: Secondary | ICD-10-CM

## 2019-11-28 DIAGNOSIS — R5381 Other malaise: Secondary | ICD-10-CM

## 2019-11-28 DIAGNOSIS — I5022 Chronic systolic (congestive) heart failure: Secondary | ICD-10-CM

## 2019-11-28 DIAGNOSIS — E782 Mixed hyperlipidemia: Secondary | ICD-10-CM | POA: Diagnosis not present

## 2019-11-28 LAB — BASIC METABOLIC PANEL
Anion gap: 9 (ref 5–15)
BUN: 13 mg/dL (ref 8–23)
CO2: 24 mmol/L (ref 22–32)
Calcium: 9.8 mg/dL (ref 8.9–10.3)
Chloride: 108 mmol/L (ref 98–111)
Creatinine, Ser: 1.24 mg/dL — ABNORMAL HIGH (ref 0.44–1.00)
GFR calc Af Amer: 50 mL/min — ABNORMAL LOW (ref >60)
GFR calc non Af Amer: 43 mL/min — ABNORMAL LOW (ref >60)
Glucose, Bld: 95 mg/dL (ref 70–99)
Potassium: 4.3 mmol/L (ref 3.5–5.1)
Sodium: 141 mmol/L (ref 135–145)

## 2019-11-28 MED ORDER — FUROSEMIDE 20 MG PO TABS: 20.0000 mg | ORAL_TABLET | ORAL | 1 refills | Status: DC

## 2019-11-28 NOTE — Patient Instructions (Signed)
Medication Instructions:  - Your physician recommends that you continue on your current medications as directed. Please refer to the Current Medication list given to you today.  *If you need a refill on your cardiac medications before your next appointment, please call your pharmacy*   Lab Work: - Your physician recommends that you have lab work today: BMP  If you have labs (blood work) drawn today and your tests are completely normal, you will receive your results only by: Marland Kitchen MyChart Message (if you have MyChart) OR . A paper copy in the mail If you have any lab test that is abnormal or we need to change your treatment, we will call you to review the results.   Testing/Procedures: - none ordered   Follow-Up: At [facility], you and your health needs are our priority.  As part of our continuing mission to provide you with exceptional heart care, we have created designated Provider Care Teams.  These Care Teams include your primary Cardiologist (physician) and Advanced Practice Providers (APPs -  Physician Assistants and Nurse Practitioners) who all work together to provide you with the care you need, when you need it.  We recommend signing up for the patient portal called "MyChart".  Sign up information is provided on this After Visit Summary.  MyChart is used to connect with patients for Virtual Visits (Telemedicine).  Patients are able to view lab/test results, encounter notes, upcoming appointments, etc.  Non-urgent messages can be sent to your provider as well.   To learn more about what you can do with MyChart, go to [email or web address].    Your next appointment:   3 month(s)  The format for your next appointment:   In Person  Provider:   You may see [name], MD or one of the following Advanced Practice Providers on your designated Care Team:    Murray Hodgkins, NP  Christell Faith, PA-C  Marrianne Mood, PA-C  Cadence Kathlen Mody, [place]    Other  Instructions n/a

## 2019-11-28 NOTE — Telephone Encounter (Signed)
Danielle Leech, RN  11/28/2019 4:52 PM EDT     The patient has been notified of the result via detailed VM per DPR on file. Lasix orders changed accordingly.   Rise Mu, PA-C  11/28/2019 1:47 PM EDT     Potassium is at goal.  Renal function is mildly elevated, just slightly above prior readings.   Recommendations: -Take Lasix 20 mg every other day

## 2019-11-30 ENCOUNTER — Telehealth: Payer: Self-pay

## 2019-11-30 NOTE — Telephone Encounter (Signed)
Patient calling very upset.  She went to get her Danielle [name] and it was $400+ until January because she is in the donut hole.  She states she can not pay that much for her medicine.  She stated she was just told this medicine has been helping her heart a lot but doesn't know what to do.

## 2019-11-30 NOTE — Telephone Encounter (Signed)
No answer. Left message to call back.   

## 2019-12-03 NOTE — Telephone Encounter (Signed)
Patient returning call about entresto

## 2019-12-03 NOTE — Telephone Encounter (Signed)
No answer. Left message to call back.   

## 2019-12-03 NOTE — Telephone Encounter (Signed)
Patient returning your call.  Please call back when you can.

## 2019-12-03 NOTE — Telephone Encounter (Signed)
Patient called back. Spoke with her and gave her the [facility] number to call to see about starting the process for patient assistance. Meanwhile one bottle (2 weeks) of Entresto samples left at front desk for patient to pick up at her convenience.  She will keep [place] posted before she runs out as she goes through the approval process.

## 2019-12-05 ENCOUNTER — Ambulatory Visit (INDEPENDENT_AMBULATORY_CARE_PROVIDER_SITE_OTHER): Payer: Medicare Other

## 2019-12-05 ENCOUNTER — Other Ambulatory Visit: Payer: Self-pay

## 2019-12-05 VITALS — Wt 257.0 lb

## 2019-12-05 DIAGNOSIS — Z0000 Encounter for general adult medical examination without abnormal findings: Secondary | ICD-10-CM

## 2019-12-05 NOTE — Telephone Encounter (Signed)
Patient has no way to get to pick up the samples.  Her ride works 5 days a week from 6 am to 6 pm. He only takes off on the days she has appointments. She only has about 10 pills left at this time.  We discussed her switching off Entresto in the meantime and still following through on the Patient assistance application.  Then once approved she could go back on it. She said she would certainly fill out the application.   Routing to Dr End.

## 2019-12-05 NOTE — Telephone Encounter (Signed)
No answer. Left message to call me back to discuss.

## 2019-12-05 NOTE — Telephone Encounter (Signed)
Attempted to call patient, no answer. I wanted to see if she had reached out to patient assistance.  Anyhow, routing to Dr End for review and what alternatives she could take.

## 2019-12-05 NOTE — Telephone Encounter (Signed)
No answer. Left message to call back.   

## 2019-12-05 NOTE — Patient Instructions (Addendum)
Danielle [name] , Thank you for taking time to come for your Medicare Wellness Visit. I appreciate your ongoing commitment to your health goals. Please review the following plan we discussed and let me know if I can assist you in the future.   Screening recommendations/referrals: Colonoscopy: Postponed until 02/18/20 Mammogram: Appt 12/19/2019 Bone Density: Done 11/29/19 Recommended yearly ophthalmology/optometry visit for glaucoma screening and checkup Recommended yearly dental visit for hygiene and checkup  Vaccinations: Influenza vaccine: Done 11/09/19 Pneumococcal vaccine: Up to date Tdap vaccine: Allergic  Shingles vaccine: Completed  12/21/17 & 12/15/18 Covid-19:Completed 1/23 & 04/28/19  Advanced directives: Please bring a copy of your health care power of attorney and living will to the office at your convenience.  Conditions/risks identified: lose weight  Next appointment: Follow up in one year for your annual wellness visit    Preventive Care 65 Years and Older, Female Preventive care refers to lifestyle choices and visits with your health care provider that can promote health and wellness. What does preventive care include?  A yearly physical exam. This is also called an annual well check.  Dental exams once or twice a year.  Routine eye exams. Ask your health care provider how often you should have your eyes checked.  Personal lifestyle choices, including:  Daily care of your teeth and gums.  Regular physical activity.  Eating a healthy diet.  Avoiding tobacco and drug use.  Limiting alcohol use.  Practicing safe sex.  Taking low-dose aspirin every day.  Taking vitamin and mineral supplements as recommended by your health care provider. What happens during an annual well check? The services and screenings done by your health care provider during your annual well check will depend on your age, overall health, lifestyle risk factors, and family history  of disease. Counseling  Your health care provider may ask you questions about your:  Alcohol use.  Tobacco use.  Drug use.  Emotional well-being.  Home and relationship well-being.  Sexual activity.  Eating habits.  History of falls.  Memory and ability to understand (cognition).  Work and work Statistician.  Reproductive health. Screening  You may have the following tests or measurements:  Height, weight, and BMI.  Blood pressure.  Lipid and cholesterol levels. These may be checked every 5 years, or more frequently if you are over 92 years old.  Skin check.  Lung cancer screening. You may have this screening every year starting at age 54 if you have a 30-pack-year history of smoking and currently smoke or have quit within the past 15 years.  Fecal occult blood test (FOBT) of the stool. You may have this test every year starting at age 32.  Flexible sigmoidoscopy or colonoscopy. You may have a sigmoidoscopy every 5 years or a colonoscopy every 10 years starting at age 22.  Hepatitis C blood test.  Hepatitis B blood test.  Sexually transmitted disease (STD) testing.  Diabetes screening. This is done by checking your blood sugar (glucose) after you have not eaten for a while (fasting). You may have this done every 1-3 years.  Bone density scan. This is done to screen for osteoporosis. You may have this done starting at age 64.  Mammogram. This may be done every 1-2 years. Talk to your health care provider about how often you should have regular mammograms. Talk with your health care provider about your test results, treatment options, and if necessary, the need for more tests. Vaccines  Your health care provider may recommend certain vaccines, such  as:  Influenza vaccine. This is recommended every year.  Tetanus, diphtheria, and acellular pertussis (Tdap, Td) vaccine. You may need a Td booster every 10 years.  Zoster vaccine. You may need this after age  22.  Pneumococcal 13-valent conjugate (PCV13) vaccine. One dose is recommended after age 10.  Pneumococcal polysaccharide (PPSV23) vaccine. One dose is recommended after age 84. Talk to your health care provider about which screenings and vaccines you need and how often you need them. This information is not intended to replace advice given to you by your health care provider. Make sure you discuss any questions you have with your health care provider. Document Released: 03/14/2015 Document Revised: 11/05/2015 Document Reviewed: 12/17/2014 Elsevier Interactive Patient Education  2017 [employer] Prevention in the Home Falls can cause injuries. They can happen to people of all ages. There are many things you can do to make your home safe and to help prevent falls. What can I do on the outside of my home?  Regularly fix the edges of walkways and driveways and fix any cracks.  Remove anything that might make you trip as you walk through a door, such as a raised step or threshold.  Trim any bushes or trees on the path to your home.  Use bright outdoor lighting.  Clear any walking paths of anything that might make someone trip, such as rocks or tools.  Regularly check to see if handrails are loose or broken. Make sure that both sides of any steps have handrails.  Any raised decks and porches should have guardrails on the edges.  Have any leaves, snow, or ice cleared regularly.  Use sand or salt on walking paths during winter.  Clean up any spills in your garage right away. This includes oil or grease spills. What can I do in the bathroom?  Use night lights.  Install grab bars by the toilet and in the tub and shower. Do not use towel bars as grab bars.  Use non-skid mats or decals in the tub or shower.  If you need to sit down in the shower, use a plastic, non-slip stool.  Keep the floor dry. Clean up any water that spills on the floor as soon as it happens.  Remove  soap buildup in the tub or shower regularly.  Attach bath mats securely with double-sided non-slip rug tape.  Do not have throw rugs and other things on the floor that can make you trip. What can I do in the bedroom?  Use night lights.  Make sure that you have a light by your bed that is easy to reach.  Do not use any sheets or blankets that are too big for your bed. They should not hang down onto the floor.  Have a firm chair that has side arms. You can use this for support while you get dressed.  Do not have throw rugs and other things on the floor that can make you trip. What can I do in the kitchen?  Clean up any spills right away.  Avoid walking on wet floors.  Keep items that you use a lot in easy-to-reach places.  If you need to reach something above you, use a strong step stool that has a grab bar.  Keep electrical cords out of the way.  Do not use floor polish or wax that makes floors slippery. If you must use wax, use non-skid floor wax.  Do not have throw rugs and other things on the  floor that can make you trip. What can I do with my stairs?  Do not leave any items on the stairs.  Make sure that there are handrails on both sides of the stairs and use them. Fix handrails that are broken or loose. Make sure that handrails are as long as the stairways.  Check any carpeting to make sure that it is firmly attached to the stairs. Fix any carpet that is loose or worn.  Avoid having throw rugs at the top or bottom of the stairs. If you do have throw rugs, attach them to the floor with carpet tape.  Make sure that you have a light switch at the top of the stairs and the bottom of the stairs. If you do not have them, ask someone to add them for you. What else can I do to help prevent falls?  Wear shoes that:  Do not have high heels.  Have rubber bottoms.  Are comfortable and fit you well.  Are closed at the toe. Do not wear sandals.  If you use a  stepladder:  Make sure that it is fully opened. Do not climb a closed stepladder.  Make sure that both sides of the stepladder are locked into place.  Ask someone to hold it for you, if possible.  Clearly mark and make sure that you can see:  Any grab bars or handrails.  First and last steps.  Where the edge of each step is.  Use tools that help you move around (mobility aids) if they are needed. These include:  Canes.  Walkers.  Scooters.  Crutches.  Turn on the lights when you go into a dark area. Replace any light bulbs as soon as they burn out.  Set up your furniture so you have a clear path. Avoid moving your furniture around.  If any of your floors are uneven, fix them.  If there are any pets around you, be aware of where they are.  Review your medicines with your doctor. Some medicines can make you feel dizzy. This can increase your chance of falling. Ask your doctor what other things that you can do to help prevent falls. This information is not intended to replace advice given to you by your health care provider. Make sure you discuss any questions you have with your health care provider. Document Released: 12/12/2008 Document Revised: 07/24/2015 Document Reviewed: 03/22/2014 Elsevier Interactive Patient Education  2017 [employer].

## 2019-12-05 NOTE — Progress Notes (Signed)
Virtual Visit via Telephone Note  I connected with  Lajoy Vanamburg on 12/05/19 at  1:45 PM EDT by telephone and verified that I am speaking with the correct person using two identifiers.  Medicare Annual Wellness visit completed telephonically due to Covid-19 pandemic.   Persons participating in this call: This Health Coach and this patient.   Location: Patient: Home Provider: Office   I discussed the limitations, risks, security and privacy concerns of performing an evaluation and management service by telephone and the availability of in person appointments. The patient expressed understanding and agreed to proceed.  Unable to perform video visit due to video visit attempted and failed and/or patient does not have video capability.   Some vital signs may be absent or patient reported.   Willette Brace, LPN    Subjective:   Yaneli Keithley is a 74 y.o. female who presents for Medicare Annual (Subsequent) preventive examination.  Review of Systems     Cardiac Risk Factors include: advanced age (>42men, >59 women);dyslipidemia;hypertension;sedentary lifestyle;obesity (BMI >30kg/m2)     Objective:    Today's Vitals   12/05/19 1322  Weight: 257 lb (116.6 kg)  PainSc: 7    Body mass index is 40.25 kg/m.  Advanced Directives 12/05/2019 05/21/2019 01/03/2019 08/28/2018 06/21/2017 05/06/2017 05/03/2017  Does Patient Have a Medical Advance Directive? Yes No (No Data) No Yes Yes No  Type of Advance Directive Living will - (No Data) - - - -  Does patient want to make changes to medical advance directive? - - - - - No - Patient declined -  Copy of [place] in Chart? - - - - - - -  Would patient like information on creating a medical advance directive? - - Yes (MAU/Ambulatory/Procedural Areas - Information given) No - Patient declined - - -  Pre-existing out of facility DNR order (yellow form or pink MOST form) - - - - - - -    Current  Medications (verified) Outpatient Encounter Medications as of 12/05/2019  Medication Sig  . acetaminophen (TYLENOL) 500 MG tablet Take 1,000 mg by mouth daily as needed (PAIN).  Marland Kitchen albuterol (PROVENTIL HFA;VENTOLIN HFA) 108 (90 Base) MCG/ACT inhaler Inhale 2 puffs into the lungs every 6 (six) hours as needed for wheezing or shortness of breath.   Marland Kitchen alendronate (FOSAMAX) 70 MG tablet Take 1 tablet (70 mg total) by mouth once a week. Take with a full glass of water on an empty stomach.  Marland Kitchen aspirin EC 81 MG tablet Take 81 mg daily by mouth.  . budesonide-formoterol (SYMBICORT) 160-4.5 MCG/ACT inhaler Inhale 2 puffs into the lungs 2 (two) times daily.  . carvedilol (COREG) 25 MG tablet Take 1 tablet (25 mg total) by mouth 2 (two) times daily.  . Cholecalciferol (VITAMIN D3) 400 units tablet Take 1 tablet (400 Units total) by mouth daily. (Patient taking differently: Take 1,000 Units by mouth daily. )  . diazepam (VALIUM) 5 MG tablet Take 5 mg by mouth once.  . diclofenac sodium (VOLTAREN) 1 % GEL Apply 4 g topically 4 (four) times daily.  . diphenhydramine-acetaminophen (TYLENOL PM) 25-500 MG TABS tablet Take 2 tablets by mouth at bedtime.   . furosemide (LASIX) 20 MG tablet Take 1 tablet (20 mg total) by mouth every other day.  Marland Kitchen MELATONIN PO Take 1 tablet by mouth at bedtime.  . methimazole (TAPAZOLE) 5 MG tablet TAKE 3 TABLETS BY MOUTH IN THE EVENING . APPOINTMENT REQUIRED FOR FUTURE REFILLS  . rosuvastatin (CRESTOR)  40 MG tablet Take 1 tablet (40 mg total) by mouth daily.  Marland Kitchen senna-docusate (SENOKOT-S) 8.6-50 MG tablet Take 2 tablets at bedtime by mouth.   . tamoxifen (NOLVADEX) 20 MG tablet Take 1 tablet by mouth once daily  . traMADol (ULTRAM) 50 MG tablet Take 1 tablet (50 mg total) by mouth 3 (three) times daily.  Marland Kitchen trolamine salicylate (ASPERCREME) 10 % cream Apply 1 application topically as needed for muscle pain.  . valACYclovir (VALTREX) 500 MG tablet Take 1 tablet (500 mg total) by mouth 2  (two) times daily. For 3 days when outbreaks.  Marland Kitchen warfarin (COUMADIN) 3 MG tablet TAKE AS DIRECTED BY ANTICOAGULATION CLINIC  . sacubitril-valsartan (ENTRESTO) 49-51 MG Take 1 tablet by mouth 2 (two) times daily. (Patient not taking: Reported on 12/05/2019)  . warfarin (COUMADIN) 1 MG tablet As directed by anticoagulation clinic (Patient not taking: Reported on 12/05/2019)   No facility-administered encounter medications on file as of 12/05/2019.    Allergies (verified) Lisinopril, Tetanus toxoid adsorbed, and Other   History: Past Medical History:  Diagnosis Date  . Allergy   . Anxiety   . Aortic stenosis    Status post bioprosthetic AVR  . Arthritis    DJD  . Asthma   . Breast cancer ([place]) 12/13/2016   Left breast  . Chronic systolic CHF (congestive heart failure) ([place])    EF 30% 04/2014  . COPD (chronic obstructive pulmonary disease) ([place])   . Coronary artery disease    per pt, had LHC prior to AVR in [place] that did not show any blockages; no stents/bypass  . Gastritis   . Hyperlipidemia   . Hypertension   . Hyperthyroidism   . Multiple thyroid nodules   . Osteoporosis   . Paroxysmal atrial fibrillation ([facility])   . Pelvis fracture ([place]) 08/19/2015   MULTIPLE   . Personal history of radiation therapy 2018  . Spontaneous pneumothorax 11/28/2015   left   . Stroke [facility]) 2000   rt hand weak   Past Surgical History:  Procedure Laterality Date  . BREAST LUMPECTOMY Left 12/2016  . BREAST LUMPECTOMY WITH RADIOACTIVE SEED AND SENTINEL LYMPH NODE BIOPSY Left 01/14/2017   Procedure: LEFT BREAST LUMPECTOMY WITH RADIOACTIVE SEED AND SENTINEL LYMPH NODE BIOPSY;  Surgeon: Rolm Bookbinder, MD;  Location: [place];  Service: General;  Laterality: Left;  . CHEST TUBE INSERTION  10/2015  . INTRAMEDULLARY (IM) NAIL INTERTROCHANTERIC Left 12/10/2015   Procedure: INTRAMEDULLARY (IM) NAIL INTERTROCHANTRIC;  Surgeon: Leandrew Koyanagi, MD;  Location: [place];  Service: Orthopedics;  Laterality:  Left;  . PLEURADESIS Left 12/03/2015   Procedure: PLEURADESIS;  Surgeon: Melrose Nakayama, MD;  Location: [place];  Service: Thoracic;  Laterality: Left;  . RESECTION OF APICAL BLEB Left 12/03/2015   Procedure: BLEBECTOMY;  Surgeon: Melrose Nakayama, MD;  Location: [place];  Service: Thoracic;  Laterality: Left;  . THORACOSCOPY  12/03/2015  . VALVE REPLACEMENT    . VIDEO ASSISTED THORACOSCOPY Left 12/03/2015   Procedure: VIDEO ASSISTED THORACOSCOPY;  Surgeon: Melrose Nakayama, MD;  Location: [facility] OR;  Service: Thoracic;  Laterality: Left;   Family History  Problem Relation Age of Onset  . Diabetes Mother   . Heart attack Mother 27  . Diabetes Father   . Lung cancer Father   . Diabetes Sister   . Thyroid disease Sister   . Diabetes Sister   . HIV Brother    Social History   Socioeconomic History  . Marital status:  Married    Spouse name: [name]  . Number of children: 0  . Years of education: Not on file  . Highest education level: Not on file  Occupational History  . Occupation: Retired in 2004  Tobacco Use  . Smoking status: Former Smoker    Packs/day: 0.25    Years: 10.00    Pack years: 2.50    Types: Cigarettes    Quit date: 03/01/2010    Years since quitting: 9.7  . Smokeless tobacco: Never Used  Vaping Use  . Vaping Use: Never used  Substance and Sexual Activity  . Alcohol use: No  . Drug use: No  . Sexual activity: Not Currently  Other Topics Concern  . Not on file  Social History Narrative   Lives with husband.  Ambulated independently.   Social Determinants of Health   Financial Resource Strain: Medium Risk  . Difficulty of Paying Living Expenses: Somewhat hard  Food Insecurity: No Food Insecurity  . Worried About Charity fundraiser in the Last Year: Never true  . Ran Out of Food in the Last Year: Never true  Transportation Needs: No Transportation Needs  . Lack of Transportation (Medical): No  . Lack of Transportation (Non-Medical): No    Physical Activity: Sufficiently Active  . Days of Exercise per Week: 7 days  . Minutes of Exercise per Session: 30 min  Stress: Stress Concern Present  . Feeling of Stress : To some extent  Social Connections: Moderately Integrated  . Frequency of Communication with Friends and Family: More than three times a week  . Frequency of Social Gatherings with Friends and Family: Once a week  . Attends Religious Services: More than 4 times per year  . Active Member of Clubs or Organizations: No  . Attends Archivist Meetings: Never  . Marital Status: Married    Tobacco Counseling Counseling given: Not Answered   Clinical Intake:  Pre-visit preparation completed: Yes  Pain : 0-10 (left knee) Pain Score: 7  Pain Type: Chronic pain Pain Location: Knee (left) Pain Descriptors / Indicators: Sore Pain Onset: More than a month ago Pain Frequency: Constant     BMI - recorded: 40.25 Nutritional Status: BMI > 30  Obese Nutritional Risks: None Diabetes: No  How often do you need to have someone help you when you read instructions, pamphlets, or other written materials from your doctor or pharmacy?: 1 - Never  Diabetic?No  Interpreter Needed?: No  Information entered by :: Charlott Rakes, LPN   Activities of Daily Living In your present state of health, do you have any difficulty performing the following activities: 12/05/2019  Hearing? N  Vision? N  Difficulty concentrating or making decisions? N  Walking or climbing stairs? Y  Comment unable  Dressing or bathing? N  Doing errands, shopping? N  Preparing Food and eating ? N  Using the Toilet? N  In the past six months, have you accidently leaked urine? Y  Comment sleeps with oad nd usig pull up  Do you have problems with loss of bowel control? N  Managing your Medications? N  Managing your Finances? N  Housekeeping or managing your Housekeeping? N  Some recent data might be hidden    Patient Care  Team: [name], [name], MD as PCP - General (Family Medicine) End, Harrell Gave, MD as PCP - Cardiology (Cardiology) [name], MD as Consulting Physician (Hematology) Kyung Rudd, MD as Consulting Physician (Radiation Oncology) Rolm Bookbinder, MD as Consulting Physician (General Surgery)  Gardenia Phlegm, NP as Nurse Practitioner (Hematology and Oncology) Earnie Larsson, [name] as Pharmacist (Pharmacist)  Indicate any recent Medical Services you may have received from other than [facility] providers in the past year (date may be approximate).     Assessment:   This is a routine wellness examination for Anhthu.  Hearing/Vision screen  Hearing Screening   125Hz  250Hz  500Hz  1000Hz  2000Hz  3000Hz  4000Hz  6000Hz  8000Hz   Right ear:           Left ear:           Comments: Pt denies any hearing at this time   Vision Screening Comments: Declines eye care   Dietary issues and exercise activities discussed: Current Exercise Habits: Home exercise routine (qB and walks around house), Type of exercise: walking, Time (Minutes): 25  Goals    . patient     Will be independent again     . Patient Stated     To walk without assistance and use a cane    . Patient Stated     Lose weight     . Pharmacy Care Plan     CARE PLAN ENTRY  Current Barriers:  . Chronic Disease Management support, education, and care coordination needs related to Hypertension, Hyperlipidemia, Atrial Fibrillation, Heart Failure, and Insomnia   Hypertension . Pharmacist Clinical Goal(s): o Over the next 180 days, patient will work with PharmD and providers to maintain BP goal <130/80 . Current regimen:  o Carvedilol 25mg , 1 tablet twice daily . Patient self care activities - Over the next 180 days, patient will: o Check BP as directed document, and provide at future appointments  Hyperlipidemia . Pharmacist Clinical Goal(s): o Over the next 180 days, patient will work with PharmD and providers to maintain  LDL goal < 70 . Current regimen:  o Rosuvastatin 40mg , 1 tablet once daily  . Interventions: o Recommend addition of ezetimibe 10mg  to target LDL goal.  . Patient self care activities - Over the next 180 days, patient will: o Recommend additional therapy to target LDL goal <70.   Atrial fibrillation  . Pharmacist Clinical Goal(s): o Over the next 180 days, patient will work with PharmD and providers to maintain normal sinus rhythm and prevent stroke.  . Current regimen:  o Carvedilol 25mg , 1 tablet twice daily   For blood clot prevention:   Warfarin 1.5mg  every Saturday.   Warfarin 3mg ,1 tablet once a day (except Saturday) . Interventions: o We discussed:  monitoring for signs and symptoms for bleeding (coughing up blood, prolonged nose bleeds, black, tarry stools). . Patient self care activities - Over the next 180 days, patient will: o Continue follow up visits for INR checks.  o Contact provider with any episodes of bleeding.   Heart failure  . Pharmacist Clinical Goal(s) o Over the next 180 days, patient will work with PharmD and providers to prevent hospitalizations.  . Current regimen:   Carvedilol 25mg , 1 tablet twice daily  sacubitril- valsartan (Entresto) 49-51mg , 1 tablet twice daily   Furosemide 20mg , 1 tablet once daily  . Interventions: o We discussed weighing daily; if you gain more than 3 pounds in one day or 5 pounds in one week call your doctor  . Patient self care activities - Over the next 180 days, patient will: o Continue current medications and monitor fluid retention (weighing daily).   Insomnia  . Pharmacist Clinical Goal(s) o Over the next 180 days, patient will work with PharmD and providers to improve  sleep . Current regimen:   Diphenhydramine- APAP 25-500mg , 2 tablet at bedtime   Melatonin 10mg , 1 tablet at bedtime . Interventions: o We discussed:  non-pharmacological interventions for insomnia. (Avoid napping, limit exposure to technology  near bedtime, etc)  o We discussed: maximum daily dose of acetaminophen (3000 to 4000mg  per day).  o Consider extended release melatonin to help maintain sleep.  . Patient self care activities o Patient will continue current medications and improve sleep hygiene.   Medication management . Pharmacist Clinical Goal(s): o Over the next 180 days, patient will work with PharmD and providers to maintain optimal medication adherence . Current pharmacy: [facility] . Interventions o Comprehensive medication review performed. o Continue current medication management strategy . Patient self care activities - Over the next 180 days, patient will: o Take medications as prescribed o Report any questions or concerns to PharmD and/or provider(s)  Initial goal documentation       Depression Screen PHQ 2/9 Scores 12/05/2019 08/24/2019 07/24/2019 09/18/2018 09/18/2018 01/04/2018 10/01/2017  PHQ - 2 Score 0 0 0 0 0 0 0  PHQ- 9 Score - - - - - - -    Fall Risk Fall Risk  12/05/2019 08/24/2019 07/24/2019 06/26/2019 12/26/2018  Falls in the past year? 0 0 0 0 0  Comment - - - - -  Number falls in past yr: 0 - - - -  Injury with Fall? 0 - - - -  Risk Factor Category  - - - - -  Risk for fall due to : Impaired vision;Impaired mobility;Impaired balance/gait Impaired balance/gait - - -  Risk for fall due to: Comment - - - - -  Follow up Falls prevention discussed - - - -    Any stairs in or around the home? Yes  If so, are there any without handrails? No  Home free of loose throw rugs in walkways, pet beds, electrical cords, etc? Yes  Adequate lighting in your home to reduce risk of falls? Yes   ASSISTIVE DEVICES UTILIZED TO PREVENT FALLS:  Life alert? Yes  Use of a cane, walker or w/c? Yes  Grab bars in the bathroom? Yes  Shower chair or bench in shower? Yes  Elevated toilet seat or a handicapped toilet? Yes   TIMED UP AND GO:  Was the test performed? No .     Cognitive Function: MMSE -  Mini Mental State Exam 06/21/2017 05/20/2016  Not completed: (No Data) (No Data)     6CIT Screen 12/05/2019  What Year? 0 points  What month? 0 points  Count back from 20 0 points  Months in reverse 4 points  Repeat phrase 2 points    Immunizations Immunization History  Administered Date(s) Administered  . Fluad Quad(high Dose 65+) 11/20/2018  . Influenza, High Dose Seasonal PF 11/24/2016, 12/08/2017  . Influenza-Unspecified 12/07/2015  . Moderna SARS-COVID-2 Vaccination 03/24/2019, 04/28/2019  . Pneumococcal Conjugate-13 11/13/2013, 11/21/2014, 12/15/2018  . Pneumococcal Polysaccharide-23 12/15/2012, 05/20/2016  . Zoster 03/01/2014  . Zoster Recombinat (Shingrix) 12/21/2017, 12/15/2018    TDAP status: Due, Education has been provided regarding the importance of this vaccine. Advised may receive this vaccine at local pharmacy or Health Dept. Aware to provide a copy of the vaccination record if obtained from local pharmacy or Health Dept. Verbalized acceptance and understanding. Pt has allergy to medication  Flu Vaccine status: Up to date Pneumococcal vaccine status: Up to date Covid-19 vaccine status: Completed vaccines  Qualifies for Shingles Vaccine? Yes   Zostavax  completed Yes   Shingrix Completed?: Yes  Screening Tests Health Maintenance  Topic Date Due  . INFLUENZA VACCINE  09/30/2019  . COLONOSCOPY  02/18/2020 (Originally 01/14/1996)  . MAMMOGRAM  12/17/2020  . DEXA SCAN  Completed  . COVID-19 Vaccine  Completed  . Hepatitis C Screening  Completed  . PNA vac Low Risk Adult  Completed    Health Maintenance  Health Maintenance Due  Topic Date Due  . INFLUENZA VACCINE  09/30/2019    Colorectal cancer screening postponed until 02/18/20 Mammogram screening Done 12/18/18 next appt 12/19/19  Bone Density status: Completed 11/29/19. Results reflect: Bone density results: OSTEOPOROSIS. Repeat every 2 years.   Additional Screening:  Hepatitis C Screening:   Completed 01/04/18  Vision Screening: Recommended annual ophthalmology exams for early detection of glaucoma and other disorders of the eye. Is the patient up to date with their annual eye exam?  No  Who is the provider or what is the name of the office in which the patient attends annual eye exams? [facility]  Dental Screening: Recommended annual dental exams for proper oral hygiene  Community Resource Referral / Chronic Care Management: CRR required this visit?  No   CCM required this visit?  No      Plan:     I have personally reviewed and noted the following in the patient's chart:   . Medical and social history . Use of alcohol, tobacco or illicit drugs  . Current medications and supplements . Functional ability and status . Nutritional status . Physical activity . Advanced directives . List of other physicians . Hospitalizations, surgeries, and ER visits in previous 12 months . Vitals . Screenings to include cognitive, depression, and falls . Referrals and appointments  In addition, I have reviewed and discussed with patient certain preventive protocols, quality metrics, and best practice recommendations. A written personalized care plan for preventive services as well as general preventive health recommendations were provided to patient.     Willette Brace, LPN   [id number]   Nurse Notes: None

## 2019-12-05 NOTE — Telephone Encounter (Signed)
I recommend that the patient apply for assistance from the manufacturer.  If she does not qualify, we could consider switching her to an ARB like losartan.  [name], MD [facility]

## 2019-12-05 NOTE — Telephone Encounter (Signed)
Let's switch Danielle [name] to losartan 50 mg daily when she has exhausted her current supply of Entresto.  She should let us know when she hears back from regarding patient assistance.  [name], MD [facility]

## 2019-12-05 NOTE — Telephone Encounter (Signed)
Pt c/o medication issue:  1. Name of Medication: Entresto   2. How are you currently taking this medication (dosage and times per day)? 49-51 mg po BID  3. Are you having a reaction (difficulty breathing--STAT)? no  4. What is your medication issue? Patient cannot afford .  She wants to know if there is a generic of this or an alternative.  Patient unable to pick up samples due to transportation and wants them to be mailed .   Please call

## 2019-12-05 NOTE — Telephone Encounter (Signed)
Patient returning call.  She spoke with patient assistance and the application has been mailed.

## 2019-12-06 NOTE — Telephone Encounter (Signed)
No answer. Left message to call back.   

## 2019-12-07 NOTE — Telephone Encounter (Signed)
Patient calling back, would appreciate another call back today h

## 2019-12-07 NOTE — Telephone Encounter (Signed)
Returned the patients call. [email or web address]. 

## 2019-12-10 MED ORDER — LOSARTAN POTASSIUM 50 MG PO TABS: 50.0000 mg | ORAL_TABLET | Freq: Every day | ORAL | 1 refills | Status: DC

## 2019-12-10 NOTE — Telephone Encounter (Signed)
Patient is returning your call.  

## 2019-12-10 NOTE — Addendum Note (Signed)
Addended by: Annia Belt on: 12/10/2019 10:50 AM   Modules accepted: Orders

## 2019-12-10 NOTE — Telephone Encounter (Signed)
No answer. Left message with Dr Darnelle Bos recommendation to finish Delene Loll, then switch to losartan 50 mg daily and to please let us know the outcome of the patient assistance process. I asked to call me back to let me know that she got the message or if she had any further questions. Rx sent to pharmacy.

## 2019-12-11 NOTE — Telephone Encounter (Signed)
Patient calling back. She verbalized understanding to stop Entresto and start Losartan 50 mg daily. She is still waiting for the application to come in the mail. Once it does she will complete her portion then bring her documents to [place] to complete. Then we can fax it for her.  She was appreciative.

## 2019-12-11 NOTE — Telephone Encounter (Signed)
No answer. Left message to call back.   

## 2019-12-11 NOTE — Telephone Encounter (Signed)
Patient returning call.

## 2019-12-16 ENCOUNTER — Other Ambulatory Visit: Payer: Self-pay | Admitting: Family Medicine

## 2019-12-16 DIAGNOSIS — E0590 Thyrotoxicosis, unspecified without thyrotoxic crisis or storm: Secondary | ICD-10-CM

## 2019-12-17 ENCOUNTER — Ambulatory Visit: Payer: Medicare Other

## 2019-12-19 ENCOUNTER — Emergency Department (HOSPITAL_COMMUNITY): Payer: Medicare Other

## 2019-12-19 ENCOUNTER — Encounter (HOSPITAL_COMMUNITY): Payer: Self-pay | Admitting: Internal Medicine

## 2019-12-19 ENCOUNTER — Other Ambulatory Visit: Payer: Self-pay

## 2019-12-19 ENCOUNTER — Inpatient Hospital Stay (HOSPITAL_COMMUNITY)
Admission: EM | Admit: 2019-12-19 | Discharge: 2019-12-22 | DRG: 177 | Disposition: A | Discharge status: Home Health | Payer: Medicare Other | Attending: Internal Medicine | Admitting: Internal Medicine

## 2019-12-19 DIAGNOSIS — Z923 Personal history of irradiation: Secondary | ICD-10-CM

## 2019-12-19 DIAGNOSIS — Z23 Encounter for immunization: Secondary | ICD-10-CM

## 2019-12-19 DIAGNOSIS — Z953 Presence of xenogenic heart valve: Secondary | ICD-10-CM | POA: Diagnosis not present

## 2019-12-19 DIAGNOSIS — I428 Other cardiomyopathies: Secondary | ICD-10-CM | POA: Diagnosis present

## 2019-12-19 DIAGNOSIS — U071 COVID-19: Principal | ICD-10-CM | POA: Diagnosis present

## 2019-12-19 DIAGNOSIS — D696 Thrombocytopenia, unspecified: Secondary | ICD-10-CM | POA: Diagnosis present

## 2019-12-19 DIAGNOSIS — I10 Essential (primary) hypertension: Secondary | ICD-10-CM | POA: Diagnosis present

## 2019-12-19 DIAGNOSIS — R531 Weakness: Secondary | ICD-10-CM | POA: Diagnosis not present

## 2019-12-19 DIAGNOSIS — Z91010 Allergy to peanuts: Secondary | ICD-10-CM

## 2019-12-19 DIAGNOSIS — N1832 Chronic kidney disease, stage 3b: Secondary | ICD-10-CM | POA: Diagnosis present

## 2019-12-19 DIAGNOSIS — F4321 Adjustment disorder with depressed mood: Secondary | ICD-10-CM | POA: Diagnosis present

## 2019-12-19 DIAGNOSIS — D72819 Decreased white blood cell count, unspecified: Secondary | ICD-10-CM | POA: Diagnosis present

## 2019-12-19 DIAGNOSIS — Z743 Need for continuous supervision: Secondary | ICD-10-CM | POA: Diagnosis not present

## 2019-12-19 DIAGNOSIS — Z8349 Family history of other endocrine, nutritional and metabolic diseases: Secondary | ICD-10-CM

## 2019-12-19 DIAGNOSIS — E785 Hyperlipidemia, unspecified: Secondary | ICD-10-CM | POA: Diagnosis not present

## 2019-12-19 DIAGNOSIS — Z853 Personal history of malignant neoplasm of breast: Secondary | ICD-10-CM | POA: Diagnosis not present

## 2019-12-19 DIAGNOSIS — Z8673 Personal history of transient ischemic attack (TIA), and cerebral infarction without residual deficits: Secondary | ICD-10-CM

## 2019-12-19 DIAGNOSIS — I130 Hypertensive heart and chronic kidney disease with heart failure and stage 1 through stage 4 chronic kidney disease, or unspecified chronic kidney disease: Secondary | ICD-10-CM | POA: Diagnosis present

## 2019-12-19 DIAGNOSIS — J9811 Atelectasis: Secondary | ICD-10-CM | POA: Diagnosis not present

## 2019-12-19 DIAGNOSIS — J9601 Acute respiratory failure with hypoxia: Secondary | ICD-10-CM | POA: Diagnosis present

## 2019-12-19 DIAGNOSIS — I2510 Atherosclerotic heart disease of native coronary artery without angina pectoris: Secondary | ICD-10-CM | POA: Diagnosis not present

## 2019-12-19 DIAGNOSIS — Z7951 Long term (current) use of inhaled steroids: Secondary | ICD-10-CM

## 2019-12-19 DIAGNOSIS — R059 Cough, unspecified: Secondary | ICD-10-CM | POA: Diagnosis not present

## 2019-12-19 DIAGNOSIS — J440 Chronic obstructive pulmonary disease with acute lower respiratory infection: Secondary | ICD-10-CM | POA: Diagnosis not present

## 2019-12-19 DIAGNOSIS — S0990XA Unspecified injury of head, initial encounter: Secondary | ICD-10-CM | POA: Diagnosis not present

## 2019-12-19 DIAGNOSIS — Z7901 Long term (current) use of anticoagulants: Secondary | ICD-10-CM

## 2019-12-19 DIAGNOSIS — E860 Dehydration: Secondary | ICD-10-CM | POA: Diagnosis present

## 2019-12-19 DIAGNOSIS — Z887 Allergy status to serum and vaccine status: Secondary | ICD-10-CM

## 2019-12-19 DIAGNOSIS — I359 Nonrheumatic aortic valve disorder, unspecified: Secondary | ICD-10-CM | POA: Diagnosis present

## 2019-12-19 DIAGNOSIS — I5022 Chronic systolic (congestive) heart failure: Secondary | ICD-10-CM | POA: Diagnosis present

## 2019-12-19 DIAGNOSIS — N179 Acute kidney failure, unspecified: Secondary | ICD-10-CM | POA: Diagnosis not present

## 2019-12-19 DIAGNOSIS — I2583 Coronary atherosclerosis due to lipid rich plaque: Secondary | ICD-10-CM | POA: Diagnosis not present

## 2019-12-19 DIAGNOSIS — Z888 Allergy status to other drugs, medicaments and biological substances status: Secondary | ICD-10-CM

## 2019-12-19 DIAGNOSIS — R296 Repeated falls: Secondary | ICD-10-CM | POA: Diagnosis not present

## 2019-12-19 DIAGNOSIS — Z79891 Long term (current) use of opiate analgesic: Secondary | ICD-10-CM

## 2019-12-19 DIAGNOSIS — Z79810 Long term (current) use of selective estrogen receptor modulators (SERMs): Secondary | ICD-10-CM

## 2019-12-19 DIAGNOSIS — J1282 Pneumonia due to coronavirus disease 2019: Secondary | ICD-10-CM | POA: Diagnosis not present

## 2019-12-19 DIAGNOSIS — Z79899 Other long term (current) drug therapy: Secondary | ICD-10-CM

## 2019-12-19 DIAGNOSIS — W19XXXA Unspecified fall, initial encounter: Secondary | ICD-10-CM | POA: Diagnosis not present

## 2019-12-19 DIAGNOSIS — M810 Age-related osteoporosis without current pathological fracture: Secondary | ICD-10-CM | POA: Diagnosis present

## 2019-12-19 DIAGNOSIS — Z7982 Long term (current) use of aspirin: Secondary | ICD-10-CM

## 2019-12-19 DIAGNOSIS — J441 Chronic obstructive pulmonary disease with (acute) exacerbation: Secondary | ICD-10-CM | POA: Diagnosis present

## 2019-12-19 DIAGNOSIS — I480 Paroxysmal atrial fibrillation: Secondary | ICD-10-CM | POA: Diagnosis not present

## 2019-12-19 DIAGNOSIS — J449 Chronic obstructive pulmonary disease, unspecified: Secondary | ICD-10-CM | POA: Diagnosis present

## 2019-12-19 DIAGNOSIS — Z66 Do not resuscitate: Secondary | ICD-10-CM | POA: Diagnosis not present

## 2019-12-19 DIAGNOSIS — Z6839 Body mass index (BMI) 39.0-39.9, adult: Secondary | ICD-10-CM

## 2019-12-19 DIAGNOSIS — E039 Hypothyroidism, unspecified: Secondary | ICD-10-CM | POA: Diagnosis present

## 2019-12-19 DIAGNOSIS — Z8616 Personal history of COVID-19: Secondary | ICD-10-CM

## 2019-12-19 DIAGNOSIS — Z8249 Family history of ischemic heart disease and other diseases of the circulatory system: Secondary | ICD-10-CM

## 2019-12-19 DIAGNOSIS — E782 Mixed hyperlipidemia: Secondary | ICD-10-CM | POA: Diagnosis present

## 2019-12-19 DIAGNOSIS — I6389 Other cerebral infarction: Secondary | ICD-10-CM | POA: Diagnosis not present

## 2019-12-19 DIAGNOSIS — R0902 Hypoxemia: Secondary | ICD-10-CM | POA: Diagnosis not present

## 2019-12-19 HISTORY — DX: Personal history of COVID-19: Z86.16

## 2019-12-19 LAB — PROTIME-INR
INR: 2.4 — ABNORMAL HIGH (ref 0.8–1.2)
Prothrombin Time: 25 seconds — ABNORMAL HIGH (ref 11.4–15.2)

## 2019-12-19 LAB — CBC WITH DIFFERENTIAL/PLATELET
Abs Immature Granulocytes: 0.01 10*3/uL (ref 0.00–0.07)
Basophils Absolute: 0 10*3/uL (ref 0.0–0.1)
Basophils Relative: 0 %
Eosinophils Absolute: 0 10*3/uL (ref 0.0–0.5)
Eosinophils Relative: 1 %
HCT: 39.1 % (ref 36.0–46.0)
Hemoglobin: 12.2 g/dL (ref 12.0–15.0)
Immature Granulocytes: 0 %
Lymphocytes Relative: 21 %
Lymphs Abs: 0.8 10*3/uL (ref 0.7–4.0)
MCH: 30.6 pg (ref 26.0–34.0)
MCHC: 31.2 g/dL (ref 30.0–36.0)
MCV: 98 fL (ref 80.0–100.0)
Monocytes Absolute: 0.4 10*3/uL (ref 0.1–1.0)
Monocytes Relative: 12 %
Neutro Abs: 2.3 10*3/uL (ref 1.7–7.7)
Neutrophils Relative %: 66 %
Platelets: 74 10*3/uL — ABNORMAL LOW (ref 150–400)
RBC: 3.99 MIL/uL (ref 3.87–5.11)
RDW: 14 % (ref 11.5–15.5)
WBC: 3.6 10*3/uL — ABNORMAL LOW (ref 4.0–10.5)
nRBC: 0 % (ref 0.0–0.2)

## 2019-12-19 LAB — COMPREHENSIVE METABOLIC PANEL
ALT: 47 U/L — ABNORMAL HIGH (ref 0–44)
AST: 42 U/L — ABNORMAL HIGH (ref 15–41)
Albumin: 3.6 g/dL (ref 3.5–5.0)
Alkaline Phosphatase: 42 U/L (ref 38–126)
Anion gap: 10 (ref 5–15)
BUN: 15 mg/dL (ref 8–23)
CO2: 23 mmol/L (ref 22–32)
Calcium: 9.9 mg/dL (ref 8.9–10.3)
Chloride: 103 mmol/L (ref 98–111)
Creatinine, Ser: 1.47 mg/dL — ABNORMAL HIGH (ref 0.44–1.00)
GFR, Estimated: 35 mL/min — ABNORMAL LOW (ref >60)
Glucose, Bld: 107 mg/dL — ABNORMAL HIGH (ref 70–99)
Potassium: 4.6 mmol/L (ref 3.5–5.1)
Sodium: 136 mmol/L (ref 135–145)
Total Bilirubin: 0.7 mg/dL (ref 0.3–1.2)
Total Protein: 7.1 g/dL (ref 6.5–8.1)

## 2019-12-19 LAB — HEPATITIS B SURFACE ANTIGEN: Hepatitis B Surface Ag: NONREACTIVE

## 2019-12-19 LAB — TROPONIN I (HIGH SENSITIVITY)
Troponin I (High Sensitivity): 14 ng/L (ref <18)
Troponin I (High Sensitivity): 15 ng/L (ref <18)

## 2019-12-19 LAB — LACTATE DEHYDROGENASE: LDH: 383 U/L — ABNORMAL HIGH (ref 98–192)

## 2019-12-19 LAB — RESPIRATORY PANEL BY RT PCR (FLU A&B, COVID)
Influenza A by PCR: NEGATIVE
Influenza B by PCR: NEGATIVE
SARS Coronavirus 2 by RT PCR: POSITIVE — AB

## 2019-12-19 LAB — APTT: aPTT: 51 seconds — ABNORMAL HIGH (ref 24–36)

## 2019-12-19 LAB — PROCALCITONIN: Procalcitonin: <0.1 ng/mL

## 2019-12-19 LAB — D-DIMER, QUANTITATIVE: D-Dimer, Quant: 1.26 ug/mL-FEU — ABNORMAL HIGH (ref 0.00–0.50)

## 2019-12-19 LAB — C-REACTIVE PROTEIN: CRP: 0.9 mg/dL (ref <1.0)

## 2019-12-19 LAB — FERRITIN: Ferritin: 501 ng/mL — ABNORMAL HIGH (ref 11–307)

## 2019-12-19 LAB — FIBRINOGEN: Fibrinogen: 434 mg/dL (ref 210–475)

## 2019-12-19 LAB — BRAIN NATRIURETIC PEPTIDE: B Natriuretic Peptide: 76.4 pg/mL (ref 0.0–100.0)

## 2019-12-19 LAB — LACTIC ACID, PLASMA: Lactic Acid, Venous: 1.6 mmol/L (ref 0.5–1.9)

## 2019-12-19 MED ORDER — ZINC SULFATE 220 (50 ZN) MG PO CAPS
220.0000 mg | ORAL_CAPSULE | Freq: Every day | ORAL | Status: DC
  Administered 2019-12-19 – 2019-12-22 (×4): 220 mg via ORAL
  Filled 2019-12-19 (×4): qty 1

## 2019-12-19 MED ORDER — SODIUM CHLORIDE 0.9 % IV SOLN: 100.0000 mg | Freq: Every day | INTRAVENOUS | Status: DC

## 2019-12-19 MED ORDER — ONDANSETRON HCL 4 MG/2ML IJ SOLN: 4.0000 mg | Freq: Four times a day (QID) | INTRAMUSCULAR | Status: DC | PRN

## 2019-12-19 MED ORDER — ASPIRIN EC 81 MG PO TBEC
81.0000 mg | DELAYED_RELEASE_TABLET | Freq: Every day | ORAL | Status: DC
  Administered 2019-12-20 – 2019-12-22 (×3): 81 mg via ORAL
  Filled 2019-12-19 (×3): qty 1

## 2019-12-19 MED ORDER — METHYLPREDNISOLONE SODIUM SUCC 125 MG IJ SOLR
1.0000 mg/kg | Freq: Two times a day (BID) | INTRAMUSCULAR | Status: DC
  Administered 2019-12-19 – 2019-12-20 (×2): 113.125 mg via INTRAVENOUS
  Filled 2019-12-19 (×2): qty 2

## 2019-12-19 MED ORDER — ASCORBIC ACID 500 MG PO TABS
500.0000 mg | ORAL_TABLET | Freq: Every day | ORAL | Status: DC
  Administered 2019-12-19 – 2019-12-22 (×4): 500 mg via ORAL
  Filled 2019-12-19 (×4): qty 1

## 2019-12-19 MED ORDER — ONDANSETRON HCL 4 MG PO TABS: 4.0000 mg | ORAL_TABLET | Freq: Four times a day (QID) | ORAL | Status: DC | PRN

## 2019-12-19 MED ORDER — ROSUVASTATIN CALCIUM 20 MG PO TABS
40.0000 mg | ORAL_TABLET | Freq: Every day | ORAL | Status: DC
  Administered 2019-12-20 – 2019-12-22 (×3): 40 mg via ORAL
  Filled 2019-12-19 (×3): qty 2

## 2019-12-19 MED ORDER — DEXAMETHASONE SODIUM PHOSPHATE 10 MG/ML IJ SOLN: 6.0000 mg | Freq: Once | INTRAMUSCULAR | Status: DC

## 2019-12-19 MED ORDER — SODIUM CHLORIDE 0.9 % IV SOLN
200.0000 mg | Freq: Once | INTRAVENOUS | Status: AC
  Administered 2019-12-19: 200 mg via INTRAVENOUS
  Filled 2019-12-19: qty 40

## 2019-12-19 MED ORDER — SACUBITRIL-VALSARTAN 49-51 MG PO TABS
1.0000 | ORAL_TABLET | Freq: Two times a day (BID) | ORAL | Status: DC
  Administered 2019-12-19 – 2019-12-20 (×2): 1 via ORAL
  Filled 2019-12-19 (×3): qty 1

## 2019-12-19 MED ORDER — TAMOXIFEN CITRATE 10 MG PO TABS
20.0000 mg | ORAL_TABLET | Freq: Every day | ORAL | Status: DC
  Administered 2019-12-20 – 2019-12-22 (×3): 20 mg via ORAL
  Filled 2019-12-19 (×3): qty 2

## 2019-12-19 MED ORDER — HYDROCOD POLST-CPM POLST ER 10-8 MG/5ML PO SUER: 5.0000 mL | Freq: Two times a day (BID) | ORAL | Status: DC | PRN

## 2019-12-19 MED ORDER — PREDNISONE 20 MG PO TABS: 50.0000 mg | ORAL_TABLET | Freq: Every day | ORAL | Status: DC

## 2019-12-19 MED ORDER — WARFARIN SODIUM 3 MG PO TABS
3.0000 mg | ORAL_TABLET | Freq: Once | ORAL | Status: AC
  Administered 2019-12-19: 3 mg via ORAL
  Filled 2019-12-19: qty 1

## 2019-12-19 MED ORDER — METHIMAZOLE 5 MG PO TABS
15.0000 mg | ORAL_TABLET | Freq: Every evening | ORAL | Status: DC
  Administered 2019-12-19 – 2019-12-21 (×3): 15 mg via ORAL
  Filled 2019-12-19: qty 3
  Filled 2019-12-19: qty 1
  Filled 2019-12-19: qty 3

## 2019-12-19 MED ORDER — MOMETASONE FURO-FORMOTEROL FUM 200-5 MCG/ACT IN AERO
2.0000 | INHALATION_SPRAY | Freq: Two times a day (BID) | RESPIRATORY_TRACT | Status: DC
  Administered 2019-12-19 – 2019-12-22 (×6): 2 via RESPIRATORY_TRACT
  Filled 2019-12-19: qty 8.8

## 2019-12-19 MED ORDER — GUAIFENESIN-DM 100-10 MG/5ML PO SYRP
10.0000 mL | ORAL_SOLUTION | ORAL | Status: DC | PRN
  Filled 2019-12-19: qty 10

## 2019-12-19 MED ORDER — SENNOSIDES-DOCUSATE SODIUM 8.6-50 MG PO TABS
2.0000 | ORAL_TABLET | Freq: Every day | ORAL | Status: DC
  Administered 2019-12-19 – 2019-12-21 (×2): 2 via ORAL
  Filled 2019-12-19 (×3): qty 2

## 2019-12-19 MED ORDER — SODIUM CHLORIDE 0.9 % IV SOLN: 200.0000 mg | Freq: Once | INTRAVENOUS | Status: DC

## 2019-12-19 MED ORDER — CARVEDILOL 25 MG PO TABS
25.0000 mg | ORAL_TABLET | Freq: Two times a day (BID) | ORAL | Status: DC
  Administered 2019-12-19 – 2019-12-21 (×4): 25 mg via ORAL
  Filled 2019-12-19 (×6): qty 1

## 2019-12-19 MED ORDER — FUROSEMIDE 20 MG PO TABS
20.0000 mg | ORAL_TABLET | ORAL | Status: DC
  Administered 2019-12-20: 20 mg via ORAL
  Filled 2019-12-19 (×2): qty 1

## 2019-12-19 MED ORDER — ACETAMINOPHEN 325 MG PO TABS
650.0000 mg | ORAL_TABLET | Freq: Four times a day (QID) | ORAL | Status: DC | PRN
  Administered 2019-12-19: 650 mg via ORAL
  Filled 2019-12-19: qty 2

## 2019-12-19 MED ORDER — SODIUM CHLORIDE 0.9 % IV SOLN
100.0000 mg | Freq: Every day | INTRAVENOUS | Status: DC
  Administered 2019-12-20: 100 mg via INTRAVENOUS
  Filled 2019-12-19: qty 20

## 2019-12-19 MED ORDER — ALBUTEROL SULFATE HFA 108 (90 BASE) MCG/ACT IN AERS
2.0000 | INHALATION_SPRAY | Freq: Four times a day (QID) | RESPIRATORY_TRACT | Status: DC
  Administered 2019-12-19 – 2019-12-22 (×11): 2 via RESPIRATORY_TRACT
  Filled 2019-12-19: qty 6.7

## 2019-12-19 MED ORDER — TRAMADOL HCL 50 MG PO TABS
50.0000 mg | ORAL_TABLET | Freq: Three times a day (TID) | ORAL | Status: DC
  Administered 2019-12-19 – 2019-12-22 (×9): 50 mg via ORAL
  Filled 2019-12-19 (×9): qty 1

## 2019-12-19 MED ORDER — LACTATED RINGERS IV BOLUS (SEPSIS)
1000.0000 mL | Freq: Once | INTRAVENOUS | Status: AC
  Administered 2019-12-19: 1000 mL via INTRAVENOUS

## 2019-12-19 MED ORDER — WARFARIN - PHARMACIST DOSING INPATIENT: Freq: Every day | Status: DC

## 2019-12-19 MED ORDER — ACETAMINOPHEN 325 MG PO TABS
650.0000 mg | ORAL_TABLET | Freq: Once | ORAL | Status: AC
  Administered 2019-12-19: 650 mg via ORAL
  Filled 2019-12-19: qty 2

## 2019-12-19 NOTE — ED Triage Notes (Signed)
Pt to ED via EMS from home c/o frequent falls and generalized weakness x 1 week. Last fall being this morning , when she was using her walker and fell on her bottom. No LOC, pt did not hit head. Pt does take Coumadin. Medical hx: depression, CABG, chronic leg pain, CHF. Per EMS husband reports recent death in family , and pt weakness has got worse since. No medications given by EMS: Last VS: 118/72, hr 86, rr 18, 93%RA. CBG 129.

## 2019-12-19 NOTE — ED Provider Notes (Signed)
[facility] EMERGENCY DEPARTMENT Provider Note   CSN: [id number] Arrival date & time: 12/19/19  1033     History Chief Complaint  Patient presents with  . Weakness  . Fall    Danielle [name] is a 74 y.o. female.  Patient here for generalized weakness.  States that she got lightheaded while using her walker and fell back onto chair she fell in a seated position on a high chair.  She did not hit her head or lose consciousness.  She has felt rundown the last several days.  Her sister died several days ago and she has been depressed about that as well.  She denies any cough, fever, chills, urinary symptoms, abdominal pain.  The history is provided by the patient.  Weakness Severity:  Mild Onset quality:  Gradual Timing:  Constant Progression:  Worsening Chronicity:  New Relieved by:  Nothing Worsened by:  Nothing Associated symptoms: lethargy   Associated symptoms: no abdominal pain, no arthralgias, no chest pain, no cough, no difficulty walking, no dysuria, no fever, no foul-smelling urine, no seizures, no shortness of breath, no stroke symptoms, no vision change and no vomiting   Fall Pertinent negatives include no chest pain, no abdominal pain and no shortness of breath.       Past Medical History:  Diagnosis Date  . Allergy   . Anxiety   . Aortic stenosis    Status post bioprosthetic AVR  . Arthritis    DJD  . Asthma   . Breast cancer ([place]) 12/13/2016   Left breast  . Chronic systolic CHF (congestive heart failure) ([place])    EF 30% 04/2014  . COPD (chronic obstructive pulmonary disease) ([place])   . Coronary artery disease    per pt, had LHC prior to AVR in [place] that did not show any blockages; no stents/bypass  . Gastritis   . Hyperlipidemia   . Hypertension   . Hyperthyroidism   . Multiple thyroid nodules   . Osteoporosis   . Paroxysmal atrial fibrillation (HCC)   . Pelvis fracture ([place]) 08/19/2015   MULTIPLE   .  Personal history of radiation therapy 2018  . Spontaneous pneumothorax 11/28/2015   left   . Stroke [facility]) 2000   rt hand weak    Patient Active Problem List   Diagnosis Date Noted  . Pneumonia due to COVID-19 virus   . Bacteremia due to Gram-positive bacteria 08/29/2018  . Abscess   . Cellulitis of left breast 08/28/2018  . Cellulitis of left abdominal wall 08/28/2018  . Encounter for routine gynecological examination 04/28/2018  . CKD (chronic kidney disease), stage III ([place]) 09/27/2017  . Recurrent genital herpes 04/13/2017  . Chronic pain disorder 01/25/2017  . Malignant neoplasm of upper-outer quadrant of left breast in female, estrogen receptor positive ([place]) 12/16/2016  . Chronic knee pain 12/07/2016  . Chronic bilateral low back pain without sciatica 12/07/2016  . Peripheral musculoskeletal gait disorder 12/07/2016  . Encounter for therapeutic drug monitoring 06/16/2016  . Generalized osteoarthritis of multiple sites 05/04/2016  . Chronic anticoagulation 05/04/2016  . Stroke ([place]) 04/14/2016  . Aortic valve disease 04/10/2016  . S/P AVR 04/10/2016  . NICM (nonischemic cardiomyopathy) ([place]) 04/10/2016  . Upper airway cough syndrome 03/14/2016  . Morbid obesity ([place]) 03/14/2016  . COPD (chronic obstructive pulmonary disease) ([place]) 03/11/2016  . Thrombocytopenia ([place]) 12/12/2015  . Anemia 12/12/2015  . Vitamin D deficiency 12/12/2015  . Acute urinary retention 12/11/2015  . Osteoporosis 12/11/2015  . Hip  fracture ([place]) 12/10/2015  . Aortic atherosclerosis ([place]) 12/10/2015  . Lung blebs ([place]) 12/03/2015  . Pelvic fracture ([place]) 08/19/2015  . Fall at home 08/19/2015  . Essential hypertension 08/19/2015  . Hyperlipidemia 08/19/2015  . Asthma 08/19/2015  . Paroxysmal atrial fibrillation ([place]) 08/19/2015  . Hyperthyroidism 08/19/2015  . Chronic systolic (congestive) heart failure ([place])   . Coronary artery disease   . Aortic stenosis     Past Surgical History:    Procedure Laterality Date  . BREAST LUMPECTOMY Left 12/2016  . BREAST LUMPECTOMY WITH RADIOACTIVE SEED AND SENTINEL LYMPH NODE BIOPSY Left 01/14/2017   Procedure: LEFT BREAST LUMPECTOMY WITH RADIOACTIVE SEED AND SENTINEL LYMPH NODE BIOPSY;  Surgeon: Rolm Bookbinder, MD;  Location: [facility];  Service: General;  Laterality: Left;  . CHEST TUBE INSERTION  10/2015  . INTRAMEDULLARY (IM) NAIL INTERTROCHANTERIC Left 12/10/2015   Procedure: INTRAMEDULLARY (IM) NAIL INTERTROCHANTRIC;  Surgeon: Leandrew Koyanagi, MD;  Location: [facility];  Service: Orthopedics;  Laterality: Left;  . PLEURADESIS Left 12/03/2015   Procedure: PLEURADESIS;  Surgeon: Melrose Nakayama, MD;  Location: [facility];  Service: Thoracic;  Laterality: Left;  . RESECTION OF APICAL BLEB Left 12/03/2015   Procedure: BLEBECTOMY;  Surgeon: Melrose Nakayama, MD;  Location: [facility];  Service: Thoracic;  Laterality: Left;  . THORACOSCOPY  12/03/2015  . VALVE REPLACEMENT    . VIDEO ASSISTED THORACOSCOPY Left 12/03/2015   Procedure: VIDEO ASSISTED THORACOSCOPY;  Surgeon: Melrose Nakayama, MD;  Location: [facility];  Service: Thoracic;  Laterality: Left;     OB History   No obstetric history on file.     Family History  Problem Relation Age of Onset  . Diabetes Mother   . Heart attack Mother 49  . Diabetes Father   . Lung cancer Father   . Diabetes Sister   . Thyroid disease Sister   . Diabetes Sister   . HIV Brother     Social History   Tobacco Use  . Smoking status: Former Smoker    Packs/day: 0.25    Years: 10.00    Pack years: 2.50    Types: Cigarettes    Quit date: 03/01/2010    Years since quitting: 9.8  . Smokeless tobacco: Never Used  Vaping Use  . Vaping Use: Never used  Substance Use Topics  . Alcohol use: No  . Drug use: No    Home Medications Prior to Admission medications   Medication Sig Start Date End Date Taking? Authorizing Provider  acetaminophen (TYLENOL) 500 MG tablet Take 1,000 mg by mouth daily as  needed (PAIN).   Yes [provider]  albuterol (PROVENTIL HFA;VENTOLIN HFA) 108 (90 Base) MCG/ACT inhaler Inhale 2 puffs into the lungs every 6 (six) hours as needed for wheezing or shortness of breath.  12/11/14  Yes [provider]  alendronate (FOSAMAX) 70 MG tablet Take 1 tablet (70 mg total) by mouth once a week. Take with a full glass of water on an empty stomach. 07/24/19  Yes [name], [name], MD  aspirin EC 81 MG tablet Take 81 mg daily by mouth.   Yes [provider]  budesonide-formoterol (SYMBICORT) 160-4.5 MCG/ACT inhaler Inhale 2 puffs into the lungs 2 (two) times daily. 03/11/16  Yes Tanda Rockers, MD  carvedilol (COREG) 25 MG tablet Take 1 tablet (25 mg total) by mouth 2 (two) times daily. 08/27/19  Yes End, Harrell Gave, MD  Cholecalciferol (VITAMIN D3) 400 units tablet Take 1 tablet (400 Units total) by  mouth daily. Patient taking differently: Take 1,000 Units by mouth daily.  12/16/15  Yes Ledell Noss, MD  diclofenac sodium (VOLTAREN) 1 % GEL Apply 4 g topically 4 (four) times daily. 05/18/18  Yes Kirsteins, Luanna Salk, MD  diphenhydramine-acetaminophen (TYLENOL PM) 25-500 MG TABS tablet Take 2 tablets by mouth at bedtime.    Yes [provider]  ENTRESTO 49-51 MG Take 1 tablet by mouth 2 (two) times daily. 12/14/19  Yes [provider]  furosemide (LASIX) 20 MG tablet Take 1 tablet (20 mg total) by mouth every other day. 11/28/19  Yes Dunn, Areta Haber, PA-C  losartan (COZAAR) 50 MG tablet Take 1 tablet (50 mg total) by mouth daily. 12/10/19 03/09/20 Yes End, Harrell Gave, MD  MELATONIN PO Take 1 tablet by mouth at bedtime.   Yes [provider]  methimazole (TAPAZOLE) 5 MG tablet TAKE 3 TABLETS BY MOUTH IN THE EVENING . APPOINTMENT REQUIRED FOR FUTURE REFILLS Patient taking differently: Take 15 mg by mouth every evening. TAKE 3 TABLETS BY MOUTH IN THE EVENING . APPOINTMENT REQUIRED FOR FUTURE REFILLS 12/17/19  Yes [name], [name], MD    rosuvastatin (CRESTOR) 40 MG tablet Take 1 tablet (40 mg total) by mouth daily. 08/27/19  Yes [name], [name], MD  senna-docusate (SENOKOT-S) 8.6-50 MG tablet Take 2 tablets at bedtime by mouth.    Yes [provider]  tamoxifen (NOLVADEX) 20 MG tablet Take 1 tablet by mouth once daily 05/04/19  Yes [name], MD  traMADol (ULTRAM) 50 MG tablet Take 1 tablet (50 mg total) by mouth 3 (three) times daily. 06/26/19  Yes Kirsteins, Luanna Salk, MD  trolamine salicylate (ASPERCREME) 10 % cream Apply 1 application topically as needed for muscle pain.   Yes [provider]  valACYclovir (VALTREX) 500 MG tablet Take 1 tablet (500 mg total) by mouth 2 (two) times daily. For 3 days when outbreaks. 01/17/18  Yes [name], [name], MD  warfarin (COUMADIN) 3 MG tablet TAKE AS DIRECTED BY ANTICOAGULATION CLINIC Patient taking differently: Take 3 mg by mouth daily at 4 PM. TAKE AS DIRECTED BY  ANTICOAGULATION  CLINIC 10/08/19  Yes [name], [name], MD  warfarin (COUMADIN) 1 MG tablet As directed by anticoagulation clinic Patient not taking: Reported on 12/05/2019 09/07/19   [name], [name], MD    Allergies    Lisinopril, Tetanus toxoid adsorbed, and Other  Review of Systems   Review of Systems  Constitutional: Positive for appetite change and fatigue. Negative for chills and fever.  HENT: Negative for ear pain and sore throat.   Eyes: Negative for pain and visual disturbance.  Respiratory: Negative for cough and shortness of breath.   Cardiovascular: Negative for chest pain and palpitations.  Gastrointestinal: Negative for abdominal pain and vomiting.  Genitourinary: Negative for dysuria and hematuria.  Musculoskeletal: Negative for arthralgias and back pain.  Skin: Negative for color change and rash.  Neurological: Positive for weakness. Negative for seizures and syncope.  All other systems reviewed and are negative.   Physical Exam Updated Vital Signs  ED Triage Vitals [12/19/19 1039]  Enc  Vitals Group     BP 127/85     Pulse Rate 85     Resp 18     Temp (!) 101.2 F (38.4 C)     Temp Source Oral     SpO2 94 %     Weight 250 lb (113.4 kg)     Height 5\' 7"  (1.702 m)     Head  Circumference      Peak Flow      Pain Score 0     Pain Loc      Pain Edu?      Excl. in [place]?     Physical Exam  ED Results / Procedures / Treatments   Labs (all labs ordered are listed, but only abnormal results are displayed) Labs Reviewed  RESPIRATORY PANEL BY RT PCR (FLU A&B, COVID) - Abnormal; Notable for the following components:      Result Value   SARS Coronavirus 2 by RT PCR POSITIVE (*)    All other components within normal limits  COMPREHENSIVE METABOLIC PANEL - Abnormal; Notable for the following components:   Glucose, Bld 107 (*)    Creatinine, Ser 1.47 (*)    AST 42 (*)    ALT 47 (*)    GFR, Estimated 35 (*)    All other components within normal limits  CBC WITH DIFFERENTIAL/PLATELET - Abnormal; Notable for the following components:   WBC 3.6 (*)    Platelets 74 (*)    All other components within normal limits  PROTIME-INR - Abnormal; Notable for the following components:   Prothrombin Time 25.0 (*)    INR 2.4 (*)    All other components within normal limits  APTT - Abnormal; Notable for the following components:   aPTT 51 (*)    All other components within normal limits  CULTURE, BLOOD (SINGLE)  URINE CULTURE  LACTIC ACID, PLASMA  LACTIC ACID, PLASMA  URINALYSIS, ROUTINE W REFLEX MICROSCOPIC  C-REACTIVE PROTEIN  BRAIN NATRIURETIC PEPTIDE  D-DIMER, QUANTITATIVE (NOT AT [facility])  FERRITIN  FIBRINOGEN  HEPATITIS B SURFACE ANTIGEN  LACTATE DEHYDROGENASE  PROCALCITONIN  TROPONIN I (HIGH SENSITIVITY)    EKG EKG Interpretation  Date/Time:  Wednesday December 19 2019 10:39:42 EDT Ventricular Rate:  86 PR Interval:    QRS Duration: 88 QT Interval:  410 QTC Calculation: 491 R Axis:   -22 Text Interpretation: Sinus rhythm Ventricular premature complex Probable  left atrial enlargement No significant change since last tracing Confirmed by Lennice Sites [phone]) on 12/19/2019 10:46:42 AM   Radiology CT Head Wo Contrast  Result Date: 12/19/2019 CLINICAL DATA:  Weakness, head injury after multiple falls. EXAM: CT HEAD WITHOUT CONTRAST TECHNIQUE: Contiguous axial images were obtained from the base of the skull through the vertex without intravenous contrast. COMPARISON:  None. FINDINGS: Brain: Mild chronic ischemic white matter disease is noted. 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 No mass effect or midline shift is noted. Ventricular size is within normal limits. There is no evidence of mass lesion, hemorrhage or acute infarction. Vascular: No hyperdense vessel or unexpected calcification. Skull: Normal. Negative for fracture or focal lesion. Sinuses/Orbits: No acute finding. Other: None. IMPRESSION: Mild chronic ischemic white matter disease. 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 No acute intracranial abnormality seen. Electronically Signed   By: Marijo Conception M.D.   On: 12/19/2019 12:43   DG Chest Port 1 View  Result Date: 12/19/2019 CLINICAL DATA:  Cough and weakness EXAM: PORTABLE CHEST 1 VIEW COMPARISON:  December 23, 2015. FINDINGS: There is postoperative change in the right upper lobe with ill-defined airspace opacity in the right upper lobe. There is bibasilar atelectasis. Heart is mildly enlarged with pulmonary vascularity normal. Patient is status post median sternotomy. There is aortic atherosclerosis. Surgical clips noted in the lateral left breast region. IMPRESSION: Postoperative change right upper lobe with subtle ill-defined opacity in the right upper lobe,  concerning for potential developing pneumonia in this area. Mild bibasilar atelectasis. Lungs elsewhere clear. The changes in the right upper lobe could be indicative of atypical organism pneumonia. Check of COVID-19 status in this regard advised. No  consolidation evident. Heart is mildly enlarged with postoperative change. Aortic Atherosclerosis (ICD10-I70.0). Electronically Signed   By: Lowella Grip III M.D.   On: 12/19/2019 11:05    Procedures .Critical Care Performed by: Lennice Sites, DO Authorized by: Lennice Sites, DO   Critical care provider statement:    Critical care time (minutes):  45   Critical care was necessary to treat or prevent imminent or life-threatening deterioration of the following conditions:  Respiratory failure   Critical care was time spent personally by me on the following activities:  Blood draw for specimens, development of treatment plan with patient or surrogate, discussions with primary provider, evaluation of patient's response to treatment, examination of patient, obtaining history from patient or surrogate, ordering and performing treatments and interventions, ordering and review of laboratory studies, ordering and review of radiographic studies, pulse oximetry, re-evaluation of patient's condition and review of old charts   I assumed direction of critical care for this patient from another provider in my specialty: no     (including critical care time)  Medications Ordered in ED Medications  remdesivir 200 mg in sodium chloride 0.9% 250 mL IVPB (has no administration in time range)    Followed by  remdesivir 100 mg in sodium chloride 0.9 % 100 mL IVPB (has no administration in time range)  albuterol (VENTOLIN HFA) 108 (90 Base) MCG/ACT inhaler 2 puff (has no administration in time range)  methylPREDNISolone sodium succinate (SOLU-MEDROL) 125 mg/2 mL injection 113.125 mg (has no administration in time range)    Followed by  predniSONE (DELTASONE) tablet 50 mg (has no administration in time range)  guaiFENesin-dextromethorphan (ROBITUSSIN DM) 100-10 MG/5ML syrup 10 mL (has no administration in time range)  chlorpheniramine-HYDROcodone (TUSSIONEX) 10-8 MG/5ML suspension 5 mL (has no  administration in time range)  ascorbic acid (VITAMIN C) tablet 500 mg (has no administration in time range)  zinc sulfate capsule 220 mg (has no administration in time range)  acetaminophen (TYLENOL) tablet 650 mg (has no administration in time range)  ondansetron (ZOFRAN) tablet 4 mg (has no administration in time range)    Or  ondansetron (ZOFRAN) injection 4 mg (has no administration in time range)  aspirin EC tablet 81 mg (has no administration in time range)  traMADol (ULTRAM) tablet 50 mg (has no administration in time range)  tamoxifen (NOLVADEX) tablet 20 mg (has no administration in time range)  carvedilol (COREG) tablet 25 mg (has no administration in time range)  sacubitril-valsartan (ENTRESTO) 49-51 mg per tablet (has no administration in time range)  rosuvastatin (CRESTOR) tablet 40 mg (has no administration in time range)  furosemide (LASIX) tablet 20 mg (has no administration in time range)  methimazole (TAPAZOLE) tablet 15 mg (has no administration in time range)  senna-docusate (Senokot-S) tablet 2 tablet (has no administration in time range)  mometasone-formoterol (DULERA) 200-5 MCG/ACT inhaler 2 puff (has no administration in time range)  lactated ringers bolus 1,000 mL (0 mLs Intravenous Stopped 12/19/19 1300)  acetaminophen (TYLENOL) tablet 650 mg (650 mg Oral Given 12/19/19 1113)    ED Course  I have reviewed the triage vital signs and the nursing notes.  Pertinent labs & imaging results that were available during my care of the patient were reviewed by me and considered in my medical decision  making (see chart for details).    MDM Rules/Calculators/A&P                          Harmonee Tozer is a 74 year old female with history of CAD, depression, A. fib on blood thinner who presents the ED with generalized weakness.  Patient found to have a fever but otherwise normal vitals.  General fatigue over the last several days.  Has had some lightheaded  episodes.  Feels dehydrated.  Denies any abdominal pain, cough, shortness of breath.  No urinary symptoms.  Patient states that her sister died 2 days ago and that she feels like her weakness is gotten worse.  She is vaccinated against Covid.  She appears depressed.  Overall vital signs are not worrisome for sepsis at this time but will pursue infectious work-up.  No need for antibiotics at this time given fever with normal vitals otherwise.  Will give Tylenol and fluid bolus and reevaluate after lab imaging urinalysis.  Patient with Covid positivity.  Chest x-ray consistent with Covid pneumonia as well.  Otherwise lab work is unremarkable.  Patient was too weak to ambulate but she became hypoxic just from standing up.  INR was at goal.  Overall patient with respiratory failure in the setting of COVID-19 causing severe weakness.  IV steroids and IV remdesivir has been ordered.  This chart was dictated using voice recognition software.  Despite best efforts to proofread,  errors can occur which can change the documentation meaning.   Final Clinical Impression(s) / ED Diagnoses Final diagnoses:  COVID-19  Acute respiratory failure with hypoxia [facility])    Rx / DC Orders ED Discharge Orders    None       Lennice Sites, DO 12/19/19 1513

## 2019-12-19 NOTE — ED Notes (Signed)
Date and time results received: 12/19/19  Test: COVID Critical Value: POS  Name of Provider Notified: [place]

## 2019-12-19 NOTE — Progress Notes (Addendum)
ANTICOAGULATION CONSULT NOTE - Initial Consult  Pharmacy Consult for warfarin Indication: atrial fibrillation  Patient Measurements: Height: 5\' 7"  (170.2 cm) Weight: 113.4 kg (250 lb) IBW/kg (Calculated) : 61.6  Labs: Recent Labs    12/19/19 1153  HGB 12.2  HCT 39.1  PLT 74*  APTT 51*  LABPROT 25.0*  INR 2.4*  CREATININE 1.47*    Estimated Creatinine Clearance: 44.3 mL/min (A) (by C-G formula based on SCr of 1.47 mg/dL (H)).   Assessment: 74 year old female with PH significant for afib for which she takes warfarin at home. Pt is admitted with [place]. Pharmacy consulted to dose warfarin while inpatient. Patient takes 3mg  of warfarin by mouth daily at home. Last dose of warfarin 10/19 at 10pm   CBC Hgb 12.2; Plt 74 (BL PLTC ~100)   10/20 INR 2.4  Goal of Therapy:  INR 2-3 Monitor platelets by anticoagulation protocol: Yes   Plan:  Warfarin 3mg  by mouth x1  Daily INR Monitor s/sx bleeding, CBC, and INR  Carolin Guernsey 12/19/2019,3:21 PM

## 2019-12-19 NOTE — Progress Notes (Signed)
Pt has arrived to floor via stretcher. Pt alert and oriented x4 in no acute distress. Pt brought up from ED by 2 NTs and placed into room 5W-31 onto a hospital bed. Pt transported on 2L/min O2. Pt placed on the monitor and vitals taken. Assessment completed and skin assessment completed with Precious Gilding, RN.

## 2019-12-19 NOTE — H&P (Addendum)
History and Physical    Danielle [name] NLZ:[id number] DOB: 1945-12-26 DOA: 12/19/2019  PCP: [name], [name], MD  Patient coming from: Home  I have personally briefly reviewed patient's old medical records in [place]  Chief Complaint: Generalized weakness and frequent falls since 1 week.  HPI: Danielle [name] is a 74 y.o. female with medical history significant of paroxysmal A. fib-on Coumadin, severe aortic stenosis s/p bioprosthetic AVR, asthma, breast cancer-on tamoxifen, COPD, coronary artery disease, hypertension, hypothyroidism, chronic systolic CHF, hyperlipidemia presents to emergency department for evaluation of generalized weakness and frequent falls since 1 week.  Patient tells me that she got lightheaded this morning while she was using her walker and fell back onto chair.  Denies loss of consciousness, head trauma, seizures, lightheadedness, dizziness prior to the fall.  Reports, cough, congestion, shortness of breath and generalized weakness since 1 week.  Her sister died recently and she has been depressed about that.  She denies leg swelling, orthopnea, PND, chest pain, abdominal pain, headache, blurry vision, abdominal pain, urinary or bowel changes.  No history of smoking, alcohol, illicit drug use.  She is sad about her sister's death.  Does not want to start on antidepressant at this time.  She denies active suicidal or homicidal thoughts.  She is fully vaccinated against COVID-19.  ED Course: Upon arrival to ED: Patient had fever of 101.2, tachypneic,  COVID-19 positive, CBC shows WBC of 3.6, platelet: 74, lactic acid: WNL, UA, BC, UC: Pending, INR: 2.4, CMP shows AKI on CKD stage III, CT head is negative for acute findings.  Chest x-ray shows postoperative change right upper lobe with subpleural ill-defined opacity in right upper lobe, concerning for potential developing pneumonia in this area.  Patient's oxygen saturation dropped in 90s with  walking-placed on 2 L of oxygen via nasal cannula.  Patient was given IV fluid, Decadron and remdesivir in ED.  [facility] hospitalist consulted for admission due to COVID-19 pneumonia.  Review of Systems: As per HPI otherwise negative.    Past Medical History:  Diagnosis Date  . Allergy   . Anxiety   . Aortic stenosis    Status post bioprosthetic AVR  . Arthritis    DJD  . Asthma   . Breast cancer ([place]) 12/13/2016   Left breast  . Chronic systolic CHF (congestive heart failure) ([place])    EF 30% 04/2014  . COPD (chronic obstructive pulmonary disease) ([place])   . Coronary artery disease    per pt, had LHC prior to AVR in [place] that did not show any blockages; no stents/bypass  . Gastritis   . Hyperlipidemia   . Hypertension   . Hyperthyroidism   . Multiple thyroid nodules   . Osteoporosis   . Paroxysmal atrial fibrillation (HCC)   . Pelvis fracture ([place]) 08/19/2015   MULTIPLE   . Personal history of radiation therapy 2018  . Spontaneous pneumothorax 11/28/2015   left   . Stroke [facility]) 2000   rt hand weak    Past Surgical History:  Procedure Laterality Date  . BREAST LUMPECTOMY Left 12/2016  . BREAST LUMPECTOMY WITH RADIOACTIVE SEED AND SENTINEL LYMPH NODE BIOPSY Left 01/14/2017   Procedure: LEFT BREAST LUMPECTOMY WITH RADIOACTIVE SEED AND SENTINEL LYMPH NODE BIOPSY;  Surgeon: Rolm Bookbinder, MD;  Location: [place];  Service: General;  Laterality: Left;  . CHEST TUBE INSERTION  10/2015  . INTRAMEDULLARY (IM) NAIL INTERTROCHANTERIC Left 12/10/2015   Procedure: INTRAMEDULLARY (IM) NAIL INTERTROCHANTRIC;  Surgeon: Leandrew Koyanagi, MD;  Location: [place];  Service: Orthopedics;  Laterality: Left;  . PLEURADESIS Left 12/03/2015   Procedure: PLEURADESIS;  Surgeon: Melrose Nakayama, MD;  Location: [place];  Service: Thoracic;  Laterality: Left;  . RESECTION OF APICAL BLEB Left 12/03/2015   Procedure: BLEBECTOMY;  Surgeon: Melrose Nakayama, MD;  Location: [place];  Service:  Thoracic;  Laterality: Left;  . THORACOSCOPY  12/03/2015  . VALVE REPLACEMENT    . VIDEO ASSISTED THORACOSCOPY Left 12/03/2015   Procedure: VIDEO ASSISTED THORACOSCOPY;  Surgeon: Melrose Nakayama, MD;  Location: [place];  Service: Thoracic;  Laterality: Left;     reports that she quit smoking about 9 years ago. Her smoking use included cigarettes. She has a 2.50 pack-year smoking history. She has never used smokeless tobacco. She reports that she does not drink alcohol and does not use drugs.  Allergies  Allergen Reactions  . Lisinopril Cough  . Tetanus Toxoid Adsorbed Swelling    Arm swelling  . Other Cough    PEANUTS    Family History  Problem Relation Age of Onset  . Diabetes Mother   . Heart attack Mother 36  . Diabetes Father   . Lung cancer Father   . Diabetes Sister   . Thyroid disease Sister   . Diabetes Sister   . HIV Brother     Prior to Admission medications   Medication Sig Start Date End Date Taking? Authorizing Provider  acetaminophen (TYLENOL) 500 MG tablet Take 1,000 mg by mouth daily as needed (PAIN).   Yes [provider]  albuterol (PROVENTIL HFA;VENTOLIN HFA) 108 (90 Base) MCG/ACT inhaler Inhale 2 puffs into the lungs every 6 (six) hours as needed for wheezing or shortness of breath.  12/11/14  Yes [provider]  alendronate (FOSAMAX) 70 MG tablet Take 1 tablet (70 mg total) by mouth once a week. Take with a full glass of water on an empty stomach. 07/24/19  Yes [name], [name], MD  aspirin EC 81 MG tablet Take 81 mg daily by mouth.   Yes [provider]  budesonide-formoterol (SYMBICORT) 160-4.5 MCG/ACT inhaler Inhale 2 puffs into the lungs 2 (two) times daily. 03/11/16  Yes Tanda Rockers, MD  carvedilol (COREG) 25 MG tablet Take 1 tablet (25 mg total) by mouth 2 (two) times daily. 08/27/19  Yes End, Harrell Gave, MD  Cholecalciferol (VITAMIN D3) 400 units tablet Take 1 tablet (400 Units total) by mouth daily. Patient taking  differently: Take 1,000 Units by mouth daily.  12/16/15  Yes Ledell Noss, MD  diclofenac sodium (VOLTAREN) 1 % GEL Apply 4 g topically 4 (four) times daily. 05/18/18  Yes Kirsteins, Luanna Salk, MD  diphenhydramine-acetaminophen (TYLENOL PM) 25-500 MG TABS tablet Take 2 tablets by mouth at bedtime.    Yes [provider]  ENTRESTO 49-51 MG Take 1 tablet by mouth 2 (two) times daily. 12/14/19  Yes [provider]  furosemide (LASIX) 20 MG tablet Take 1 tablet (20 mg total) by mouth every other day. 11/28/19  Yes Dunn, Areta Haber, PA-C  losartan (COZAAR) 50 MG tablet Take 1 tablet (50 mg total) by mouth daily. 12/10/19 03/09/20 Yes End, Harrell Gave, MD  MELATONIN PO Take 1 tablet by mouth at bedtime.   Yes [provider]  methimazole (TAPAZOLE) 5 MG tablet TAKE 3 TABLETS BY MOUTH IN THE EVENING . APPOINTMENT REQUIRED FOR FUTURE REFILLS Patient taking differently: Take 15 mg by mouth every evening. TAKE 3 TABLETS BY MOUTH IN THE EVENING . APPOINTMENT  REQUIRED FOR FUTURE REFILLS 12/17/19  Yes [name], [name], MD  rosuvastatin (CRESTOR) 40 MG tablet Take 1 tablet (40 mg total) by mouth daily. 08/27/19  Yes [name], [name], MD  senna-docusate (SENOKOT-S) 8.6-50 MG tablet Take 2 tablets at bedtime by mouth.    Yes [provider]  tamoxifen (NOLVADEX) 20 MG tablet Take 1 tablet by mouth once daily 05/04/19  Yes [name], MD  traMADol (ULTRAM) 50 MG tablet Take 1 tablet (50 mg total) by mouth 3 (three) times daily. 06/26/19  Yes Kirsteins, Luanna Salk, MD  trolamine salicylate (ASPERCREME) 10 % cream Apply 1 application topically as needed for muscle pain.   Yes [provider]  valACYclovir (VALTREX) 500 MG tablet Take 1 tablet (500 mg total) by mouth 2 (two) times daily. For 3 days when outbreaks. 01/17/18  Yes [name], [name], MD  warfarin (COUMADIN) 3 MG tablet TAKE AS DIRECTED BY ANTICOAGULATION CLINIC Patient taking differently: Take 3 mg by mouth daily at 4 PM. TAKE AS  DIRECTED BY  ANTICOAGULATION  CLINIC 10/08/19  Yes [name], [name], MD  warfarin (COUMADIN) 1 MG tablet As directed by anticoagulation clinic Patient not taking: Reported on 12/05/2019 09/07/19   [name], [name], MD    Physical Exam: Vitals:   12/19/19 1039 12/19/19 1215 12/19/19 1303 12/19/19 1330  BP: 127/85 126/75  106/70  Pulse: 85 85  89  Resp: 18 (!) 24  (!) 24  Temp: (!) 101.2 F (38.4 C)  98.4 F (36.9 C)   TempSrc: Oral  Oral   SpO2: 94% 93%  94%  Weight: 113.4 kg     Height: 5\' 7"  (1.702 m)       Constitutional: NAD, calm, comfortable, on 2 L of oxygen via nasal cannula, communicating well Eyes: PERRL, lids and conjunctivae normal ENMT: Mucous membranes are moist. Posterior pharynx clear of any exudate or lesions.Normal dentition.  Neck: normal, supple, no masses, no thyromegaly Respiratory: clear to auscultation bilaterally, no wheezing, no crackles. Normal respiratory effort. No accessory muscle use.  Cardiovascular: Regular rate and rhythm, no murmurs / rubs / gallops. No extremity edema. 2+ pedal pulses. No carotid bruits.  Abdomen: no tenderness, no masses palpated. No hepatosplenomegaly. Bowel sounds positive.  Musculoskeletal: no clubbing / cyanosis. No joint deformity upper and lower extremities. Good ROM, no contractures. Normal muscle tone.  Skin: no rashes, lesions, ulcers. No induration Neurologic: CN 2-12 grossly intact. Sensation intact, DTR normal. Strength 5/5 in all 4.  Psychiatric: Normal judgment and insight. Alert and oriented x 3. Normal mood.    Labs on Admission: I have personally reviewed following labs and imaging studies  CBC: Recent Labs  Lab 12/19/19 1153  WBC 3.6*  NEUTROABS 2.3  HGB 12.2  HCT 39.1  MCV 98.0  PLT 74*   Basic Metabolic Panel: Recent Labs  Lab 12/19/19 1153  NA 136  K 4.6  CL 103  CO2 23  GLUCOSE 107*  BUN 15  CREATININE 1.47*  CALCIUM 9.9   GFR: Estimated Creatinine Clearance: 44.3 mL/min (A) (by C-G formula  based on SCr of 1.47 mg/dL (H)). Liver Function Tests: Recent Labs  Lab 12/19/19 1153  AST 42*  ALT 47*  ALKPHOS 42  BILITOT 0.7  PROT 7.1  ALBUMIN 3.6   No results for input(s): LIPASE, AMYLASE in the last 168 hours. No results for input(s): AMMONIA in the last 168 hours. Coagulation Profile: Recent Labs  Lab 12/19/19 1153  INR 2.4*   Cardiac Enzymes:  No results for input(s): CKTOTAL, CKMB, CKMBINDEX, TROPONINI in the last 168 hours. BNP (last 3 results) No results for input(s): PROBNP in the last 8760 hours. HbA1C: No results for input(s): HGBA1C in the last 72 hours. CBG: No results for input(s): GLUCAP in the last 168 hours. Lipid Profile: No results for input(s): CHOL, HDL, LDLCALC, TRIG, CHOLHDL, LDLDIRECT in the last 72 hours. Thyroid Function Tests: No results for input(s): TSH, T4TOTAL, FREET4, T3FREE, THYROIDAB in the last 72 hours. Anemia Panel: No results for input(s): VITAMINB12, FOLATE, FERRITIN, TIBC, IRON, RETICCTPCT in the last 72 hours. Urine analysis:    Component Value Date/Time   COLORURINE AMBER (A) 12/12/2015 0037   APPEARANCEUR CLEAR 12/12/2015 0037   LABSPEC 1.027 12/12/2015 0037   PHURINE 6.0 12/12/2015 0037   GLUCOSEU NEGATIVE 12/12/2015 0037   HGBUR NEGATIVE 12/12/2015 0037   BILIRUBINUR NEGATIVE 12/12/2015 0037   KETONESUR NEGATIVE 12/12/2015 0037   PROTEINUR 30 (A) 12/12/2015 0037   NITRITE POSITIVE (A) 12/12/2015 0037   LEUKOCYTESUR NEGATIVE 12/12/2015 0037    Radiological Exams on Admission: CT Head Wo Contrast  Result Date: 12/19/2019 CLINICAL DATA:  Weakness, head injury after multiple falls. EXAM: CT HEAD WITHOUT CONTRAST TECHNIQUE: Contiguous axial images were obtained from the base of the skull through the vertex without intravenous contrast. COMPARISON:  None. FINDINGS: Brain: Mild chronic ischemic white matter disease is noted. 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 No mass effect or midline shift is  noted. Ventricular size is within normal limits. There is no evidence of mass lesion, hemorrhage or acute infarction. Vascular: No hyperdense vessel or unexpected calcification. Skull: Normal. Negative for fracture or focal lesion. Sinuses/Orbits: No acute finding. Other: None. IMPRESSION: Mild chronic ischemic white matter disease. 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 No acute intracranial abnormality seen. Electronically Signed   By: Marijo Conception M.D.   On: 12/19/2019 12:43   DG Chest Port 1 View  Result Date: 12/19/2019 CLINICAL DATA:  Cough and weakness EXAM: PORTABLE CHEST 1 VIEW COMPARISON:  December 23, 2015. FINDINGS: There is postoperative change in the right upper lobe with ill-defined airspace opacity in the right upper lobe. There is bibasilar atelectasis. Heart is mildly enlarged with pulmonary vascularity normal. Patient is status post median sternotomy. There is aortic atherosclerosis. Surgical clips noted in the lateral left breast region. IMPRESSION: Postoperative change right upper lobe with subtle ill-defined opacity in the right upper lobe, concerning for potential developing pneumonia in this area. Mild bibasilar atelectasis. Lungs elsewhere clear. The changes in the right upper lobe could be indicative of atypical organism pneumonia. Check of COVID-19 status in this regard advised. No consolidation evident. Heart is mildly enlarged with postoperative change. Aortic Atherosclerosis (ICD10-I70.0). Electronically Signed   By: Lowella Grip III M.D.   On: 12/19/2019 11:05    EKG: Independently reviewed.  Sinus rhythm, ventricular premature complexes.  No ST elevation or depression noted.  Assessment/Plan Principal Problem:   Pneumonia due to COVID-19 virus Active Problems:   Essential hypertension   Hyperlipidemia   Paroxysmal atrial fibrillation (HCC)   Coronary artery disease   Thrombocytopenia (HCC)   COPD (chronic obstructive pulmonary disease) (HCC)    Aortic valve disease   Sepsis/acute hypoxemic respiratory failure secondary to COVID-19 pneumonia: -Patient presented with fever of 101.2, tachypneic, WBC of 3.6, lactic acid: WNL, COVID-19 positive.  Reviewed chest x-ray.  Requiring 2 L of oxygen via nasal cannula -Receive IV fluid, Decadron and remdesivir in ED. -Admit patient on  the floor.  On continuous pulse ox. -Continue remdesivir as per pharmacy, start patient on Solu-Medrol.  Check inflammatory markers.  Blood culture, procalcitonin, D-dimer: CRP: All pending.  Repeat inflammatory markers tomorrow AM. 0 start patient on p.o. vitamin, albuterol every 6 hours and antitussive. -Monitor vitals closely. -Patient was told that if COVID-19 pneumonitis gets worse we might potentially use non-FDA approved baricitinib.  She denies known history of tuberculosis or hepatitis and wants to proceed with baricitinib if necessary.  Recurrent falls: -Multifactorial-physical deconditioning, Covid pneumonia, dehydration -CT head is negative for acute findings. -Consult PT/OT  Hypertension: Blood pressure is stable -Continue Coreg, hold losartan due to worsening kidney function.  Monitor blood pressure closely.  AKI on CKD stage IIIb: -Avoid nephrotoxic medication.  Monitor kidney function closely.  History of left breast cancer: Diagnosed in 2018 status post breast lumpectomy and adjuvant radiation. -Followed by oncology Dr. Burr Medico -Continue tamoxifen -She is scheduled for mammogram on December 27, 2019.  Leukopenia/thrombocytopenia: Chronic -Continue to monitor.  Hyperlipidemia: Continue statin  Hypothyroidism: Continue methimazole.  Check TSH.  Coronary artery disease: Stable.  Continue aspirin, statin, Coreg.  Chronic systolic CHF/nonischemic cardiomyopathy: Patient appears euvolemic on exam. -Continue aspirin, statin, Lasix, Entresto.  Strict I&O's and daily weight.  Monitor signs of fluid overload.  Check electrolytes.  History of  severe aortic stenosis status post bioprosthetic AVR: Stable -Continue aspirin, Coumadin  Paroxysmal A. fib: Rate controlled.  Reviewed EKG. -CHA2DS2-VASc score: 6 -Coumadin per pharmacy.  Continue Coreg.  Morbid obesity with BMI of 39: Encouraged weight loss, dietary modification.  CVA: Continue aspirin, statin.  Situational depression: -Due to recent death in family. -Supportive care.  DVT prophylaxis: Coumadin/SCD Code Status: DNR-confirmed with the patient Family Communication: None present at bedside.  Plan of care discussed with patient in length and he verbalized understanding and agreed with it. Disposition Plan: 3 to 4 days Consults called: None Admission status: Inpatient   Mckinley Jewel MD [facility]  If 7PM-7AM, please contact night-coverage [email or web address]  12/19/2019, 3:04 PM

## 2019-12-20 DIAGNOSIS — I480 Paroxysmal atrial fibrillation: Secondary | ICD-10-CM

## 2019-12-20 DIAGNOSIS — I10 Essential (primary) hypertension: Secondary | ICD-10-CM

## 2019-12-20 DIAGNOSIS — U071 COVID-19: Secondary | ICD-10-CM | POA: Diagnosis not present

## 2019-12-20 LAB — CBC WITH DIFFERENTIAL/PLATELET
Abs Immature Granulocytes: 0 10*3/uL (ref 0.00–0.07)
Basophils Absolute: 0 10*3/uL (ref 0.0–0.1)
Basophils Relative: 0 %
Eosinophils Absolute: 0 10*3/uL (ref 0.0–0.5)
Eosinophils Relative: 0 %
HCT: 34.4 % — ABNORMAL LOW (ref 36.0–46.0)
Hemoglobin: 11 g/dL — ABNORMAL LOW (ref 12.0–15.0)
Immature Granulocytes: 0 %
Lymphocytes Relative: 24 %
Lymphs Abs: 0.6 10*3/uL — ABNORMAL LOW (ref 0.7–4.0)
MCH: 30.6 pg (ref 26.0–34.0)
MCHC: 32 g/dL (ref 30.0–36.0)
MCV: 95.6 fL (ref 80.0–100.0)
Monocytes Absolute: 0.2 10*3/uL (ref 0.1–1.0)
Monocytes Relative: 6 %
Neutro Abs: 1.8 10*3/uL (ref 1.7–7.7)
Neutrophils Relative %: 70 %
Platelets: 66 10*3/uL — ABNORMAL LOW (ref 150–400)
RBC: 3.6 MIL/uL — ABNORMAL LOW (ref 3.87–5.11)
RDW: 14.1 % (ref 11.5–15.5)
WBC: 2.6 10*3/uL — ABNORMAL LOW (ref 4.0–10.5)
nRBC: 0 % (ref 0.0–0.2)

## 2019-12-20 LAB — COMPREHENSIVE METABOLIC PANEL
ALT: 40 U/L (ref 0–44)
AST: 37 U/L (ref 15–41)
Albumin: 2.7 g/dL — ABNORMAL LOW (ref 3.5–5.0)
Alkaline Phosphatase: 35 U/L — ABNORMAL LOW (ref 38–126)
Anion gap: 8 (ref 5–15)
BUN: 17 mg/dL (ref 8–23)
CO2: 20 mmol/L — ABNORMAL LOW (ref 22–32)
Calcium: 8.8 mg/dL — ABNORMAL LOW (ref 8.9–10.3)
Chloride: 109 mmol/L (ref 98–111)
Creatinine, Ser: 1.31 mg/dL — ABNORMAL HIGH (ref 0.44–1.00)
GFR, Estimated: 43 mL/min — ABNORMAL LOW (ref >60)
Glucose, Bld: 117 mg/dL — ABNORMAL HIGH (ref 70–99)
Potassium: 4.2 mmol/L (ref 3.5–5.1)
Sodium: 137 mmol/L (ref 135–145)
Total Bilirubin: 0.5 mg/dL (ref 0.3–1.2)
Total Protein: 5.5 g/dL — ABNORMAL LOW (ref 6.5–8.1)

## 2019-12-20 LAB — PROTIME-INR
INR: 2.4 — ABNORMAL HIGH (ref 0.8–1.2)
Prothrombin Time: 25.4 seconds — ABNORMAL HIGH (ref 11.4–15.2)

## 2019-12-20 LAB — D-DIMER, QUANTITATIVE: D-Dimer, Quant: 0.42 ug/mL-FEU (ref 0.00–0.50)

## 2019-12-20 LAB — PHOSPHORUS: Phosphorus: 2.2 mg/dL — ABNORMAL LOW (ref 2.5–4.6)

## 2019-12-20 LAB — C-REACTIVE PROTEIN: CRP: 1 mg/dL — ABNORMAL HIGH (ref <1.0)

## 2019-12-20 LAB — FERRITIN: Ferritin: 520 ng/mL — ABNORMAL HIGH (ref 11–307)

## 2019-12-20 LAB — MAGNESIUM: Magnesium: 1.7 mg/dL (ref 1.7–2.4)

## 2019-12-20 MED ORDER — ALBUTEROL SULFATE HFA 108 (90 BASE) MCG/ACT IN AERS
2.0000 | INHALATION_SPRAY | Freq: Once | RESPIRATORY_TRACT | Status: DC | PRN
  Filled 2019-12-20: qty 6.7

## 2019-12-20 MED ORDER — SODIUM CHLORIDE 0.9 % IV SOLN
Freq: Once | INTRAVENOUS | Status: AC
  Filled 2019-12-20: qty 20

## 2019-12-20 MED ORDER — DIPHENHYDRAMINE HCL 50 MG/ML IJ SOLN: 50.0000 mg | Freq: Once | INTRAMUSCULAR | Status: DC | PRN

## 2019-12-20 MED ORDER — EPINEPHRINE 0.3 MG/0.3ML IJ SOAJ
0.3000 mg | Freq: Once | INTRAMUSCULAR | Status: DC | PRN
  Filled 2019-12-20: qty 0.3

## 2019-12-20 MED ORDER — METHYLPREDNISOLONE SODIUM SUCC 125 MG IJ SOLR: 125.0000 mg | Freq: Once | INTRAMUSCULAR | Status: DC | PRN

## 2019-12-20 MED ORDER — FAMOTIDINE IN NACL 20-0.9 MG/50ML-% IV SOLN
20.0000 mg | Freq: Once | INTRAVENOUS | Status: DC | PRN
  Filled 2019-12-20: qty 50

## 2019-12-20 MED ORDER — WARFARIN SODIUM 3 MG PO TABS
3.0000 mg | ORAL_TABLET | Freq: Every day | ORAL | Status: DC
  Administered 2019-12-21: 3 mg via ORAL
  Filled 2019-12-20 (×4): qty 1

## 2019-12-20 MED ORDER — SODIUM CHLORIDE 0.9 % IV SOLN: INTRAVENOUS | Status: DC | PRN

## 2019-12-20 NOTE — Evaluation (Signed)
Physical Therapy Evaluation Patient Details Name: Danielle [name] MRN: [id number] DOB: Jul 28, 1945 Today's Date: 12/20/2019   History of Present Illness  Pt is 74 yo female with PMH including afib, aortic stenosis s/p AVR, asthma, breast CA, COPD, CAD, HTN, hypothyroidism, CHF, and hyperlipidemia.  Pt presented to ED from falls and weakness.  Pt found to have sepsis and resp failure due to COVID 19 PNE.  Pt additional reports situational depression due to sister passing away on Monday.  Clinical Impression  Pt admitted with above diagnosis. Pt requiring min cues for transfer techniques, encouragement, and breathing techniques during session.  She was able to progress to min A for sit to stands and gait with cues.  She did fatigue easily and required seated rest breaks but O2 sats maintained >90% on RA.  Pt has necessary DME and good support at home.  Has hx of recent falls - recommend continued HHPT to progress at discharge.  Pt currently with functional limitations due to the deficits listed below (see PT Problem List). Pt will benefit from skilled PT to increase their independence and safety with mobility to allow discharge to the venue listed below.       Follow Up Recommendations Home health PT;Supervision/Assistance - 24 hour    Equipment Recommendations  None recommended by PT    Recommendations for Other Services       Precautions / Restrictions Precautions Precautions: Fall      Mobility  Bed Mobility Overal bed mobility: Needs Assistance             General bed mobility comments: in chair at arrival - reports nursing staff assisted    Transfers Overall transfer level: Needs assistance Equipment used: Rolling walker (2 wheeled) Transfers: Sit to/from Stand Sit to Stand: Min assist;Mod assist         General transfer comment: Sit to stand x 4 throughout session.  Required cues for hand placement and leaning forward.  First sit to stand mod A but  progressed to min A with cues and encouragement.  Ambulation/Gait Ambulation/Gait assistance: Min guard Gait Distance (Feet): 40 Feet (20',40',20') Assistive device: Rolling walker (2 wheeled) Gait Pattern/deviations: Step-to pattern;Decreased stride length Gait velocity: decreased   General Gait Details: min cues for RW proximity and rest break; took 2 seated rest breaks  Stairs            Wheelchair Mobility    Modified Rankin (Stroke Patients Only)       Balance Overall balance assessment: Needs assistance Sitting-balance support: No upper extremity supported Sitting balance-Leahy Scale: Good     Standing balance support: Bilateral upper extremity supported Standing balance-Leahy Scale: Poor Standing balance comment: used RW                             Pertinent Vitals/Pain Pain Assessment: No/denies pain    Home Living Family/patient expects to be discharged to:: Private residence Living Arrangements: Spouse/significant other Available Help at Discharge: Family;Available 24 hours/day Type of Home: House Home Access: Ramped entrance     Home Layout: One level Home Equipment: Shower seat;Bedside commode;Walker - 4 wheels;Wheelchair - manual      Prior Function Level of Independence: Needs assistance   Gait / Transfers Assistance Needed: Pt typically uses a rollator; can normally ambulate short community distances  ADL's / Homemaking Assistance Needed: Pt does ADLs, spouse does IADLs and shopping  Comments: Reports falls and weakness starting this week  Hand Dominance        Extremity/Trunk Assessment   Upper Extremity Assessment Upper Extremity Assessment: Defer to OT evaluation (some limitations L shoulder -chronic)    Lower Extremity Assessment Lower Extremity Assessment: Overall WFL for tasks assessed    Cervical / Trunk Assessment Cervical / Trunk Assessment: Normal  Communication   Communication: No difficulties   Cognition Arousal/Alertness: Awake/alert Behavior During Therapy: WFL for tasks assessed/performed Overall Cognitive Status: Within Functional Limits for tasks assessed                                 General Comments: Reports sad/depression since sister passed away on 2022-05-26      General Comments General comments (skin integrity, edema, etc.): Pt on RA with O2 sats 93% rest and 90-92% ambulation.  RR increased and DOE of 3/4 requiring seated rest breaks for ambulation.  Cues for pursed lip deep breathing.  Pt demonstrated use of incentive spirometer up to 1,250 mL and use of flutter.  Pt reports she uses these at home after her heart surgery.    Exercises     Assessment/Plan    PT Assessment Patient needs continued PT services  PT Problem List Decreased strength;Decreased mobility;Decreased range of motion;Decreased activity tolerance;Cardiopulmonary status limiting activity;Decreased balance;Decreased knowledge of use of DME       PT Treatment Interventions DME instruction;Therapeutic activities;Gait training;Therapeutic exercise;Patient/family education;Functional mobility training;Balance training    PT Goals (Current goals can be found in the Care Plan section)  Acute Rehab PT Goals Patient Stated Goal: return home PT Goal Formulation: With patient Time For Goal Achievement: 01/03/20 Potential to Achieve Goals: Good    Frequency Min 3X/week   Barriers to discharge        Co-evaluation               AM-PAC PT "6 Clicks" Mobility  Outcome Measure Help needed turning from your back to your side while in a flat bed without using bedrails?: A Little Help needed moving from lying on your back to sitting on the side of a flat bed without using bedrails?: A Little Help needed moving to and from a bed to a chair (including a wheelchair)?: A Little Help needed standing up from a chair using your arms (e.g., wheelchair or bedside chair)?: A Little Help  needed to walk in hospital room?: A Little Help needed climbing 3-5 steps with a railing? : A Lot 6 Click Score: 17    End of Session Equipment Utilized During Treatment: Gait belt Activity Tolerance: Patient tolerated treatment well Patient left: in chair;with call bell/phone within reach Nurse Communication: Mobility status PT Visit Diagnosis: Other abnormalities of gait and mobility (R26.89);Muscle weakness (generalized) (M62.81)    Time: 6433-2951 PT Time Calculation (min) (ACUTE ONLY): 28 min   Charges:   PT Evaluation $PT Eval Moderate Complexity: 1 Mod PT Treatments $Gait Training: 8-22 mins        Abran Richard, PT Acute Rehab Services Pager [phone] [facility] 12/20/2019, 11:51 AM

## 2019-12-20 NOTE — Plan of Care (Signed)
Pt. States she feels less sob today and she her oxygen levels have remained in the lower 90's,  She is very knowledgeable about her medication which I verified through the teach back method.  She has rested comfortably in room with call bell in reach.  She calls for assistance to ambulate and has remained free of fails.

## 2019-12-20 NOTE — Progress Notes (Signed)
ANTICOAGULATION CONSULT NOTE - Follow Up Consult  Pharmacy Consult for Warfarin Indication: atrial fibrillation  Allergies  Allergen Reactions  . Lisinopril Cough  . Tetanus Toxoid Adsorbed Swelling    Arm swelling  . Other Cough    PEANUTS    Patient Measurements: Height: 5\' 7"  (170.2 cm) Weight: 113.6 kg (250 lb 7.1 oz) IBW/kg (Calculated) : 61.6  Vital Signs: Temp: 98.8 F (37.1 C) (10/21 1240) Temp Source: Oral (10/21 1240) BP: 95/70 (10/21 1240) Pulse Rate: 92 (10/21 1240)  Labs: Recent Labs    12/19/19 1153 12/19/19 1545 12/19/19 1815 12/20/19 0642  HGB 12.2  --   --  11.0*  HCT 39.1  --   --  34.4*  PLT 74*  --   --  66*  APTT 51*  --   --   --   LABPROT 25.0*  --   --  25.4*  INR 2.4*  --   --  2.4*  CREATININE 1.47*  --   --  1.31*  TROPONINIHS  --  14 15  --     Estimated Creatinine Clearance: 49.8 mL/min (A) (by C-G formula based on SCr of 1.31 mg/dL (H)).  Assessment:  74 yr old female on warfarin as prior to admission for atrial fibrillation.  Recurrent falls noted. Chronic thrombocytopenia.    INR remains therapeutic (2.4) on home warfarin regimen of 3 mg daily.  No bleeding reported.  Goal of Therapy:  INR 2-3 Monitor platelets by anticoagulation protocol: Yes   Plan:  Continue Warfarin 3 mg daily. Daily PT/INR for now. Follow CBC. Monitor for any s/sx bleeding.  Arty Baumgartner, [place] Phone: [phone] 12/20/2019,12:52 PM

## 2019-12-20 NOTE — Progress Notes (Signed)
PROGRESS NOTE                                                                             PROGRESS NOTE                                                                                                                                                                                                             Patient Demographics:    Danielle [name], is a 74 y.o. female, DOB - 20-Sep-1945, HDQ:[id number]  Outpatient Primary MD for the patient is [name], [name], MD    LOS - 1  Admit date - 12/19/2019    Chief Complaint  Patient presents with  . Weakness  . Fall       Brief Narrative     HPI: Danielle [name] is a 74 y.o. female with medical history significant of paroxysmal A. fib-on Coumadin, severe aortic stenosis s/p bioprosthetic AVR, asthma, breast cancer-on tamoxifen, COPD, coronary artery disease, hypertension, hypothyroidism, chronic systolic CHF, hyperlipidemia presents to emergency department for evaluation of generalized weakness and frequent falls since 1 week.  Patient tells me that she got lightheaded this morning while she was using her walker and fell back onto chair.  Denies loss of consciousness, head trauma, seizures, lightheadedness, dizziness prior to the fall.  Reports, cough, congestion, shortness of breath and generalized weakness since 1 week.  Her sister died recently and she has been depressed about that.  She denies leg swelling, orthopnea, PND, chest pain, abdominal pain, headache, blurry vision, abdominal pain, urinary or bowel changes.  No history of smoking, alcohol, illicit drug use.  She is sad about her sister's death.  Does not want to start on antidepressant at this time.  She denies active suicidal or homicidal thoughts.  She is fully vaccinated against COVID-19.  ED Course: Upon arrival to ED: Patient had fever of 101.2, tachypneic,  COVID-19 positive, CBC shows WBC of 3.6, platelet: 74,  lactic acid: WNL, UA, BC, UC: Pending, INR: 2.4, CMP shows AKI on CKD stage III, CT  head is negative for acute findings.  Chest x-ray shows postoperative change right upper lobe with subpleural ill-defined opacity in right upper lobe, concerning for potential developing pneumonia in this area.  Patient's oxygen saturation dropped in 90s with walking-placed on 2 L of oxygen via nasal cannula.  Patient was given IV fluid, Decadron and remdesivir in ED.  [facility] hospitalist consulted for admission due to COVID-19 pneumonia.   Subjective:    [name] today reports some generalized weakness, diarrhea, weakness and fatigue.      Assessment  & Plan :    Principal Problem:   Pneumonia due to COVID-19 virus Active Problems:   Essential hypertension   Hyperlipidemia   Paroxysmal atrial fibrillation (HCC)   Coronary artery disease   Thrombocytopenia (HCC)   COPD (chronic obstructive pulmonary disease) (HCC)   Aortic valve disease   COVID-19 infection -Patient presented with fever of 101.2, tachypneic, WBC of 3.6, lactic acid: WNL, COVID-19 positive.  R -Sepsis ruled out -Patient was seen and examined, she has no documented hypoxia, I ambulated her in the room today with oxygen saturation maintained more than 90 on room air, at this point I think she is a candidate for monoclonal antibody in her risks including age, CKD, hypertension and obesity, and she is within 10 days of her symptoms, so we will give monoclonal antibody, I have stopped her steroids and Remdesivir, given no hypoxia and no indication for these meds at this point, they can be reinstituted letter and her disease if she does develop respiratory failure where she will require IV remdesivir and steroids.  . -Continue to trend inflammatory markers . -She was encouraged to use incentive spirometry, flutter valve, she was encouraged out of bed to chair f she developed predatory failure related through her  disease   Recurrent falls: -Multifactorial-physical deconditioning, Covid pneumonia, dehydration -CT head is negative for acute findings. -Consult PT/OT  Hypertension:  -Blood pressure is soft, will continue with Coreg and discontinue Entresto .  AKI on CKD stage IIIb: -Avoid nephrotoxic medication.  Monitor kidney function closely.  History of left breast cancer: Diagnosed in 2018 status post breast lumpectomy and adjuvant radiation. -Followed by oncology Dr. Burr Medico -Continue tamoxifen -She is scheduled for mammogram on December 27, 2019.  Leukopenia/thrombocytopenia: Chronic -Continue to monitor.  Hyperlipidemia: Continue statin  Hypothyroidism: Continue methimazole.   Coronary artery disease: Stable.  Continue aspirin, statin, Coreg.  Chronic systolic CHF/nonischemic cardiomyopathy: Patient appears euvolemic on exam. -Continue aspirin, statin, Lasix, and Coreg, Entresto has been stopped given soft blood pressure, Strict I&O's and daily weight.  Monitor signs of fluid overload.  Check electrolytes.  History of severe aortic stenosis status post bioprosthetic AVR: Stable -Continue aspirin,   Paroxysmal A. fib: Rate controlled.  Reviewed EKG. -CHA2DS2-VASc score: 6 -Coumadin per pharmacy.  Continue Coreg.  Morbid obesity with BMI of 39: Encouraged weight loss, dietary modification.  CVA: Continue aspirin, statin.  Situational depression: -Due to recent death in family. -Supportive care.   Recent Labs  Lab 12/19/19 1153 12/19/19 1204 12/19/19 1545 12/19/19 1615 12/20/19 0642  WBC 3.6*  --   --   --  2.6*  PLT 74*  --   --   --  66*  CRP  --   --   --  0.9 1.0*  BNP  --   --  76.4  --   --   DDIMER  --   --  1.26*  --  0.42  PROCALCITON  --   --  <  0.10  --   --   AST 42*  --   --   --  37  ALT 47*  --   --   --  40  ALKPHOS 42  --   --   --  35*  BILITOT 0.7  --   --   --  0.5  ALBUMIN 3.6  --   --   --  2.7*  INR 2.4*  --   --   --  2.4*   LATICACIDVEN 1.6  --   --   --   --   SARSCOV2NAA  --  POSITIVE*  --   --   --        ABG     Component Value Date/Time   PHART 7.425 12/04/2015 0436   PCO2ART 34.2 12/04/2015 0436   PO2ART 73.3 (L) 12/04/2015 0436   HCO3 22.0 12/04/2015 0436   ACIDBASEDEF 1.7 12/04/2015 0436   O2SAT 94.6 12/04/2015 0436        Condition - Extremely Guarded  Family Communication  : Left her spouse voicemail  Code Status : DNR  Consults  :  none  Procedures  :  none  Disposition Plan  :    Status is: Inpatient  Remains inpatient appropriate because:IV treatments appropriate due to intensity of illness or inability to take PO   Dispo: The patient is from: Home              Anticipated d/c is to: Home              Anticipated d/c date is: 2 days              Patient currently is not medically stable to d/c.      DVT Prophylaxis  :  On warfarin  Lab Results  Component Value Date   PLT 66 (L) 12/20/2019    Diet :  Diet Order            Diet Heart Room service appropriate? Yes; Fluid consistency: Thin  Diet effective now                  Inpatient Medications  Scheduled Meds: . albuterol  2 puff Inhalation Q6H  . vitamin C  500 mg Oral Daily  . aspirin EC  81 mg Oral Daily  . carvedilol  25 mg Oral BID  . furosemide  20 mg Oral QODAY  . methimazole  15 mg Oral QPM  . mometasone-formoterol  2 puff Inhalation BID  . rosuvastatin  40 mg Oral Daily  . senna-docusate  2 tablet Oral QHS  . tamoxifen  20 mg Oral Daily  . traMADol  50 mg Oral TID  . warfarin  3 mg Oral q1600  . Warfarin - Pharmacist Dosing Inpatient   Does not apply q1600  . zinc sulfate  220 mg Oral Daily   Continuous Infusions: . sodium chloride    . famotidine (PEPCID) IV     PRN Meds:.sodium chloride, acetaminophen, albuterol, chlorpheniramine-HYDROcodone, diphenhydrAMINE, EPINEPHrine, famotidine (PEPCID) IV, guaiFENesin-dextromethorphan, methylPREDNISolone (SOLU-MEDROL) injection,  ondansetron **OR** ondansetron (ZOFRAN) IV  Antibiotics  :    Anti-infectives (From admission, onward)   Start     Dose/Rate Route Frequency Ordered Stop   12/20/19 1000  remdesivir 100 mg in sodium chloride 0.9 % 100 mL IVPB  Status:  Discontinued       "Followed by" Linked Group Details   100 mg 200 mL/hr over 30 Minutes Intravenous Daily  12/19/19 1455 12/20/19 1050   12/20/19 1000  remdesivir 100 mg in sodium chloride 0.9 % 100 mL IVPB  Status:  Discontinued       "Followed by" Linked Group Details   100 mg 200 mL/hr over 30 Minutes Intravenous Daily 12/19/19 1502 12/19/19 1502   12/19/19 1515  remdesivir 200 mg in sodium chloride 0.9% 250 mL IVPB  Status:  Discontinued       "Followed by" Linked Group Details   200 mg 580 mL/hr over 30 Minutes Intravenous Once 12/19/19 1502 12/19/19 1502   12/19/19 1500  remdesivir 200 mg in sodium chloride 0.9% 250 mL IVPB       "Followed by" Linked Group Details   200 mg 580 mL/hr over 30 Minutes Intravenous Once 12/19/19 1455 12/19/19 1823        Dimitrious Micciche M.D on 12/20/2019 at 2:09 PM  To page go to [email or web address] -    Objective:   Vitals:   12/20/19 1050 12/20/19 1101 12/20/19 1240 12/20/19 1255  BP:  90/65 95/70 103/89  Pulse:   92 90  Resp:   (!) 24 16  Temp: 98.7 F (37.1 C) 98.7 F (37.1 C) 98.8 F (37.1 C) 98.7 F (37.1 C)  TempSrc: Oral Oral Oral Oral  SpO2:      Weight:      Height:        Wt Readings from Last 3 Encounters:  12/19/19 113.6 kg  12/05/19 116.6 kg  11/28/19 116.6 kg     Intake/Output Summary (Last 24 hours) at 12/20/2019 1409 Last data filed at 12/19/2019 2028 Gross per 24 hour  Intake 490 ml  Output --  Net 490 ml     Physical Exam  Awake Alert, No new F.N deficits, Normal affect Symmetrical Chest wall movement, Good air movement bilaterally, CTAB RRR,No Gallops,Rubs or new Murmurs, No Parasternal Heave +ve B.Sounds, Abd Soft, No tenderness, No rebound - guarding or  rigidity. No Cyanosis, Clubbing or edema, No new Rash or bruise     Data Review:    CBC Recent Labs  Lab 12/19/19 1153 12/20/19 0642  WBC 3.6* 2.6*  HGB 12.2 11.0*  HCT 39.1 34.4*  PLT 74* 66*  MCV 98.0 95.6  MCH 30.6 30.6  MCHC 31.2 32.0  RDW 14.0 14.1  LYMPHSABS 0.8 0.6*  MONOABS 0.4 0.2  EOSABS 0.0 0.0  BASOSABS 0.0 0.0    Recent Labs  Lab 12/19/19 1153 12/19/19 1545 12/19/19 1615 12/20/19 0642  NA 136  --   --  137  K 4.6  --   --  4.2  CL 103  --   --  109  CO2 23  --   --  20*  GLUCOSE 107*  --   --  117*  BUN 15  --   --  17  CREATININE 1.47*  --   --  1.31*  CALCIUM 9.9  --   --  8.8*  AST 42*  --   --  37  ALT 47*  --   --  40  ALKPHOS 42  --   --  35*  BILITOT 0.7  --   --  0.5  ALBUMIN 3.6  --   --  2.7*  MG  --   --   --  1.7  CRP  --   --  0.9 1.0*  DDIMER  --  1.26*  --  0.42  PROCALCITON  --  <0.10  --   --  LATICACIDVEN 1.6  --   --   --   INR 2.4*  --   --  2.4*  BNP  --  76.4  --   --     ------------------------------------------------------------------------------------------------------------------ No results for input(s): CHOL, HDL, LDLCALC, TRIG, CHOLHDL, LDLDIRECT in the last 72 hours.  Lab Results  Component Value Date   HGBA1C 5.0 08/28/2018   ------------------------------------------------------------------------------------------------------------------ No results for input(s): TSH, T4TOTAL, T3FREE, THYROIDAB in the last 72 hours.  Invalid input(s): FREET3  Cardiac Enzymes No results for input(s): CKMB, TROPONINI, MYOGLOBIN in the last 168 hours.  Invalid input(s): CK ------------------------------------------------------------------------------------------------------------------    Component Value Date/Time   BNP 76.4 12/19/2019 1545    Micro Results Recent Results (from the past 240 hour(s))  Respiratory Panel by RT PCR (Flu A&B, Covid) - Nasopharyngeal Swab     Status: Abnormal   Collection Time: 12/19/19  12:04 PM   Specimen: Nasopharyngeal Swab  Result Value Ref Range Status   SARS Coronavirus 2 by RT PCR POSITIVE (A) NEGATIVE Final    Comment: emailed L. Berdik RN 13:25 12/19/19 ([email or web address]) (NOTE) SARS-CoV-2 target nucleic acids are DETECTED.  SARS-CoV-2 RNA is generally detectable in upper respiratory specimens  during the acute phase of infection. Positive results are indicative of the presence of the identified virus, but do not rule out bacterial infection or co-infection with other pathogens not detected by the test. Clinical correlation with patient history and other diagnostic information is necessary to determine patient infection status. The expected result is Negative.  Fact Sheet for Patients:  PinkCheek.be  Fact Sheet for Healthcare Providers: GravelBags.it  This test is not yet approved or cleared by the [place] FDA and  has been authorized for detection and/or diagnosis of SARS-CoV-2 by FDA under an Emergency Use Authorization (EUA).  This EUA will remain in effect (meaning this test can be used) for the duration of  the COVID-19  declaration under Section 564(b)(1) of the Act, 21 U.S.C. section 360bbb-3(b)(1), unless the authorization is terminated or revoked sooner.      Influenza A by PCR NEGATIVE NEGATIVE Final   Influenza B by PCR NEGATIVE NEGATIVE Final    Comment: (NOTE) The Xpert Xpress SARS-CoV-2/FLU/RSV assay is intended as an aid in  the diagnosis of influenza from Nasopharyngeal swab specimens and  should not be used as a sole basis for treatment. Nasal washings and  aspirates are unacceptable for Xpert Xpress SARS-CoV-2/FLU/RSV  testing.  Fact Sheet for Patients: PinkCheek.be  Fact Sheet for Healthcare Providers: GravelBags.it  This test is not yet approved or cleared by the [place] FDA and  has been authorized for  detection and/or diagnosis of SARS-CoV-2 by  FDA under an Emergency Use Authorization (EUA). This EUA will remain  in effect (meaning this test can be used) for the duration of the  Covid-19 declaration under Section 564(b)(1) of the Act, 21  U.S.C. section 360bbb-3(b)(1), unless the authorization is  terminated or revoked. Performed at [facility], [place] [address] [place], [place] [address]     Radiology Reports CT Head Wo Contrast  Result Date: 12/19/2019 CLINICAL DATA:  Weakness, head injury after multiple falls. EXAM: CT HEAD WITHOUT CONTRAST TECHNIQUE: Contiguous axial images were obtained from the base of the skull through the vertex without intravenous contrast. COMPARISON:  None. FINDINGS: Brain: Mild chronic ischemic white matter disease is noted. 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 No mass effect or midline shift is noted. Ventricular size is within  normal limits. There is no evidence of mass lesion, hemorrhage or acute infarction. Vascular: No hyperdense vessel or unexpected calcification. Skull: Normal. Negative for fracture or focal lesion. Sinuses/Orbits: No acute finding. Other: None. IMPRESSION: Mild chronic ischemic white matter disease. 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 No acute intracranial abnormality seen. Electronically Signed   By: Marijo Conception M.D.   On: 12/19/2019 12:43   DG Chest Port 1 View  Result Date: 12/19/2019 CLINICAL DATA:  Cough and weakness EXAM: PORTABLE CHEST 1 VIEW COMPARISON:  December 23, 2015. FINDINGS: There is postoperative change in the right upper lobe with ill-defined airspace opacity in the right upper lobe. There is bibasilar atelectasis. Heart is mildly enlarged with pulmonary vascularity normal. Patient is status post median sternotomy. There is aortic atherosclerosis. Surgical clips noted in the lateral left breast region. IMPRESSION: Postoperative change right upper lobe with subtle  ill-defined opacity in the right upper lobe, concerning for potential developing pneumonia in this area. Mild bibasilar atelectasis. Lungs elsewhere clear. The changes in the right upper lobe could be indicative of atypical organism pneumonia. Check of COVID-19 status in this regard advised. No consolidation evident. Heart is mildly enlarged with postoperative change. Aortic Atherosclerosis (ICD10-I70.0). Electronically Signed   By: Lowella Grip III M.D.   On: 12/19/2019 11:05   ECHOCARDIOGRAM COMPLETE  Result Date: 11/21/2019    ECHOCARDIOGRAM REPORT   Patient Name:   GOLDEN EMILE Date of Exam: 11/21/2019 Medical Rec #:  [id number]                   Height:       67.0 in Accession #:    [id number]                  Weight:       258.2 lb Date of Birth:  12/20/1945                  BSA:          2.254 m Patient Age:    74 years                    BP:           118/82 mmHg Patient Gender: F                           HR:           80 bpm. Exam [facility] Procedure: 2D Echo, Cardiac Doppler, Color Doppler and Intracardiac            Opacification Agent Indications:    I35.8 Other nonrheumatic aortic valve disorders  History:        Patient has prior history of Echocardiogram examinations, most                 recent 01/18/2019. Cardiomyopathy and CHF, CAD, COPD and Stroke,                 Aortic Valve Disease, Arrythmias:Atrial Fibrillation; Risk                 Factors:Former Smoker, Hypertension and Dyslipidemia.  Sonographer:    Pilar Jarvis RDMS, RVT, RDCS Referring Phys: [id number] Rise Mu  Sonographer Comments: Technically difficult study due to poor echo windows and patient is morbidly obese. Definity helped images somewhat IMPRESSIONS  1. Porcine AVR, Procedure Date: 2015.  2. Left ventricular ejection fraction,  by estimation, is 45 to 50%. The left ventricle has mildly decreased function. The left ventricle demonstrates global hypokinesis. There is mild left ventricular  hypertrophy. Left ventricular diastolic function could not be evaluated.  3. Right ventricular systolic function is normal. The right ventricular size is normal.  4. The mitral valve is grossly normal. No evidence of mitral valve regurgitation. FINDINGS  Left Ventricle: Left ventricular ejection fraction, by estimation, is 45 to 50%. The left ventricle has mildly decreased function. The left ventricle demonstrates global hypokinesis. Definity contrast agent was given IV to delineate the left ventricular  endocardial borders. The left ventricular internal cavity size was normal in size. There is mild left ventricular hypertrophy. Left ventricular diastolic function could not be evaluated. Right Ventricle: The right ventricular size is normal. No increase in right ventricular wall thickness. Right ventricular systolic function is normal. Left Atrium: Left atrial size was normal in size. Right Atrium: Right atrial size was not well visualized. Pericardium: There is no evidence of pericardial effusion. Mitral Valve: The mitral valve is grossly normal. No evidence of mitral valve regurgitation. Tricuspid Valve: The tricuspid valve is not well visualized. Tricuspid valve regurgitation is not demonstrated. Aortic Valve: The aortic valve was not well visualized. Aortic valve regurgitation is not visualized. Aortic valve mean gradient measures 9.7 mmHg. Aortic valve peak gradient measures 18.0 mmHg. Aortic valve area, by VTI measures 1.32 cm. There is a porcine valve present in the aortic position. Pulmonic Valve: The pulmonic valve was not well visualized. Pulmonic valve regurgitation is not visualized. Aorta: The aortic root is normal in size and structure. Venous: The inferior vena cava was not well visualized. IAS/Shunts: The interatrial septum was not assessed.  LEFT VENTRICLE PLAX 2D LVIDd:         4.00 cm LVIDs:         3.40 cm LV PW:         1.20 cm LV IVS:        1.20 cm LVOT diam:     2.20 cm LV SV:         53 LV  SV Index:   24 LVOT Area:     3.80 cm  LEFT ATRIUM         Index LA diam:    4.10 cm 1.82 cm/m  AORTIC VALVE AV Area (Vmax):    1.50 cm AV Area (Vmean):   1.26 cm AV Area (VTI):     1.32 cm AV Vmax:           212.33 cm/s AV Vmean:          142.333 cm/s AV VTI:            0.403 m AV Peak Grad:      18.0 mmHg AV Mean Grad:      9.7 mmHg LVOT Vmax:         83.60 cm/s LVOT Vmean:        47.000 cm/s LVOT VTI:          0.140 m LVOT/AV VTI ratio: 0.35  AORTA Ao Root diam: 3.40 cm Ao Arch diam: 2.7 cm  SHUNTS Systemic VTI:  0.14 m Systemic Diam: 2.20 cm Kate Sable MD Electronically signed by Kate Sable MD Signature Date/Time: 11/21/2019/4:11:17 PM    Final

## 2019-12-20 NOTE — Progress Notes (Signed)
   12/19/19 2200  Assess: MEWS Score  Temp (!) 100.8 F (38.2 C)  BP 126/75  Pulse Rate 92  ECG Heart Rate 93  Resp (!) 21  SpO2 97 %  O2 Device Nasal Cannula  Patient Activity (if Appropriate) In bed  O2 Flow Rate (L/min) 2 L/min  Assess: MEWS Score  MEWS Temp 1  MEWS Systolic 0  MEWS Pulse 0  MEWS RR 1  MEWS LOC 0  MEWS Score 2  MEWS Score Color Yellow  Assess: if the MEWS score is Yellow or Red  Were vital signs taken at a resting state? Yes  Focused Assessment No change from prior assessment  Early Detection of Sepsis Score *See Row Information* Low  MEWS guidelines implemented *See Row Information* Yes  Treat  Pain Scale 0-10  Pain Score 0  Take Vital Signs  Increase Vital Sign Frequency  Yellow: Q 2hr X 2 then Q 4hr X 2, if remains yellow, continue Q 4hrs  Escalate  MEWS: Escalate Yellow: discuss with charge nurse/RN and consider discussing with provider and RRT  Notify: Charge Nurse/RN  Name of Charge Nurse/RN Notified Elmyra Ricks RN  Date Charge Nurse/RN Notified 12/19/19  Time Charge Nurse/RN Notified 2215  Document  Progress note created (see row info) Yes

## 2019-12-20 NOTE — Evaluation (Signed)
Occupational Therapy Evaluation Patient Details Name: Danielle [name] MRN: [id number] DOB: 1945/11/23 Today's Date: 12/20/2019    History of Present Illness Pt is 74 yo female with PMH including afib, aortic stenosis s/p AVR, asthma, breast CA, COPD, CAD, HTN, hypothyroidism, CHF, and hyperlipidemia.  Pt presented to ED from falls and weakness.  Pt found to have sepsis and resp failure due to COVID 19 PNE.  Pt additional reports situational depression due to sister passing away on Monday.   Clinical Impression   PTA, pt was living with her husband and was independent with ADLs; husband performing IADL. Pt currently performing ADLs and functional mobility at [facility] A level with RW. Pt presenting with decreased balance, strength, and activity tolerance. Pt performing functional mobility in hallway with Min Guard A, RW, and seated rest break. Pt maintain SpO2 in 90s on RA (inconsistent pleth line during mobility).  Pt would benefit from further acute OT to facilitate safe dc. Recommend dc to home with HHOT for further OT to optimize safety, independence with ADLs, and return to PLOF.     Follow Up Recommendations  Home health OT;Supervision/Assistance - 24 hour    Equipment Recommendations  None recommended by OT    Recommendations for Other Services PT consult     Precautions / Restrictions Precautions Precautions: Fall      Mobility Bed Mobility               General bed mobility comments: in chair at arrival - reports nursing staff assisted    Transfers Overall transfer level: Needs assistance Equipment used: Rolling walker (2 wheeled) Transfers: Sit to/from Stand Sit to Stand: Min guard         General transfer comment: Min Guard A for safety. Increased time and effort to power up into standing    Balance Overall balance assessment: Needs assistance Sitting-balance support: No upper extremity supported Sitting balance-Leahy Scale: Good      Standing balance support: Bilateral upper extremity supported Standing balance-Leahy Scale: Poor Standing balance comment: used RW                           ADL either performed or assessed with clinical judgement   ADL Overall ADL's : Needs assistance/impaired Eating/Feeding: Set up;Sitting   Grooming: Supervision/safety;Set up;Sitting   Upper Body Bathing: Supervision/ safety;Set up;Sitting   Lower Body Bathing: Min guard;Sit to/from stand   Upper Body Dressing : Supervision/safety;Set up;Sitting   Lower Body Dressing: Min guard;Sit to/from stand   Toilet Transfer: Min guard;Ambulation;RW (simulated to chair) Toilet Transfer Details (indicate cue type and reason): Min Guard A for safety         Functional mobility during ADLs: Min guard;Rolling walker General ADL Comments: Pt presenting with decreased strength, balance, and activity [place]rance. SpO2 maintaining in 90s on RA; however, with poor pleth line it was drop to 80s. Once rested and resting, Spo2 quickly returned to 90s with good pleth.      Vision         Perception     Praxis      Pertinent Vitals/Pain Pain Assessment: No/denies pain     Hand Dominance Right   Extremity/Trunk Assessment Upper Extremity Assessment Upper Extremity Assessment: Overall WFL for tasks assessed (Chronic L shoulder limitations/pain)   Lower Extremity Assessment Lower Extremity Assessment: Defer to PT evaluation   Cervical / Trunk Assessment Cervical / Trunk Assessment: Normal   Communication Communication Communication: No difficulties  Cognition Arousal/Alertness: Awake/alert Behavior During Therapy: WFL for tasks assessed/performed Overall Cognitive Status: Within Functional Limits for tasks assessed                                 General Comments: Reports sad/depression since sister passed away on 2022-06-15. Tearful at times.   General Comments  SpO2 90s with good pleth; dropping to low  80s but poor pleth line    Exercises     Shoulder Instructions      Home Living Family/patient expects to be discharged to:: Private residence Living Arrangements: Spouse/significant other Available Help at Discharge: Family;Available 24 hours/day Type of Home: House Home Access: Ramped entrance     Home Layout: One level     Bathroom Shower/Tub: Occupational psychologist: Standard (uses 3 in 1 over it)     Home Equipment: Shower seat;Bedside commode;Walker - 4 wheels;Wheelchair - manual          Prior Functioning/Environment Level of Independence: Needs assistance  Gait / Transfers Assistance Needed: Pt typically uses a rollator; can normally ambulate short community distances ADL's / Homemaking Assistance Needed: Pt does ADLs, spouse does IADLs and shopping   Comments: Reports falls and weakness starting this week        OT Problem List: Decreased strength;Decreased range of motion;Decreased activity tolerance;Impaired balance (sitting and/or standing);Decreased knowledge of use of DME or AE;Decreased knowledge of precautions;Decreased safety awareness      OT Treatment/Interventions: Self-care/ADL training;Therapeutic exercise;Energy conservation;DME and/or AE instruction;Therapeutic activities;Patient/family education    OT Goals(Current goals can be found in the care plan section) Acute Rehab OT Goals Patient Stated Goal: "My goal is to walk on my own" OT Goal Formulation: With patient Time For Goal Achievement: 01/03/20 Potential to Achieve Goals: Good  OT Frequency: Min 2X/week   Barriers to D/C:            Co-evaluation              AM-PAC OT "6 Clicks" Daily Activity     Outcome Measure Help from another person eating meals?: A Little Help from another person taking care of personal grooming?: A Little Help from another person toileting, which includes using toliet, bedpan, or urinal?: A Little Help from another person bathing  (including washing, rinsing, drying)?: A Little Help from another person to put on and taking off regular upper body clothing?: A Little Help from another person to put on and taking off regular lower body clothing?: A Little 6 Click Score: 18   End of Session Equipment Utilized During Treatment: Rolling walker Nurse Communication: Mobility status  Activity Tolerance: Patient tolerated treatment well Patient left: in chair;with call bell/phone within reach  OT Visit Diagnosis: Unsteadiness on feet (R26.81);Other abnormalities of gait and mobility (R26.89);Muscle weakness (generalized) (M62.81)                Time: 9563-8756 OT Time Calculation (min): 23 min Charges:  OT General Charges $OT Visit: 1 Visit OT Evaluation $OT Eval Moderate Complexity: 1 Mod OT Treatments $Therapeutic Activity: 8-22 mins  Davan Nawabi MSOT, OTR/L Acute Rehab Pager: [phone] Office: [place] 12/20/2019, 5:05 PM

## 2019-12-21 ENCOUNTER — Inpatient Hospital Stay (HOSPITAL_COMMUNITY): Payer: Medicare Other

## 2019-12-21 LAB — COMPREHENSIVE METABOLIC PANEL
ALT: 40 U/L (ref 0–44)
AST: 32 U/L (ref 15–41)
Albumin: 2.5 g/dL — ABNORMAL LOW (ref 3.5–5.0)
Alkaline Phosphatase: 31 U/L — ABNORMAL LOW (ref 38–126)
Anion gap: 5 (ref 5–15)
BUN: 18 mg/dL (ref 8–23)
CO2: 22 mmol/L (ref 22–32)
Calcium: 8.7 mg/dL — ABNORMAL LOW (ref 8.9–10.3)
Chloride: 109 mmol/L (ref 98–111)
Creatinine, Ser: 1.19 mg/dL — ABNORMAL HIGH (ref 0.44–1.00)
GFR, Estimated: 48 mL/min — ABNORMAL LOW (ref >60)
Glucose, Bld: 138 mg/dL — ABNORMAL HIGH (ref 70–99)
Potassium: 4.2 mmol/L (ref 3.5–5.1)
Sodium: 136 mmol/L (ref 135–145)
Total Bilirubin: 0.6 mg/dL (ref 0.3–1.2)
Total Protein: 5.3 g/dL — ABNORMAL LOW (ref 6.5–8.1)

## 2019-12-21 LAB — CBC WITH DIFFERENTIAL/PLATELET
Abs Immature Granulocytes: 0 10*3/uL (ref 0.00–0.07)
Basophils Absolute: 0 10*3/uL (ref 0.0–0.1)
Basophils Relative: 0 %
Eosinophils Absolute: 0 10*3/uL (ref 0.0–0.5)
Eosinophils Relative: 0 %
HCT: 32.9 % — ABNORMAL LOW (ref 36.0–46.0)
Hemoglobin: 10.7 g/dL — ABNORMAL LOW (ref 12.0–15.0)
Immature Granulocytes: 0 %
Lymphocytes Relative: 20 %
Lymphs Abs: 0.6 10*3/uL — ABNORMAL LOW (ref 0.7–4.0)
MCH: 30.9 pg (ref 26.0–34.0)
MCHC: 32.5 g/dL (ref 30.0–36.0)
MCV: 95.1 fL (ref 80.0–100.0)
Monocytes Absolute: 0.5 10*3/uL (ref 0.1–1.0)
Monocytes Relative: 16 %
Neutro Abs: 1.9 10*3/uL (ref 1.7–7.7)
Neutrophils Relative %: 64 %
Platelets: 74 10*3/uL — ABNORMAL LOW (ref 150–400)
RBC: 3.46 MIL/uL — ABNORMAL LOW (ref 3.87–5.11)
RDW: 14 % (ref 11.5–15.5)
WBC: 3 10*3/uL — ABNORMAL LOW (ref 4.0–10.5)
nRBC: 0 % (ref 0.0–0.2)

## 2019-12-21 LAB — C-REACTIVE PROTEIN: CRP: 0.8 mg/dL (ref <1.0)

## 2019-12-21 LAB — PROTIME-INR
INR: 2.7 — ABNORMAL HIGH (ref 0.8–1.2)
Prothrombin Time: 28 seconds — ABNORMAL HIGH (ref 11.4–15.2)

## 2019-12-21 LAB — BRAIN NATRIURETIC PEPTIDE: B Natriuretic Peptide: 93.2 pg/mL (ref 0.0–100.0)

## 2019-12-21 LAB — FERRITIN: Ferritin: 568 ng/mL — ABNORMAL HIGH (ref 11–307)

## 2019-12-21 LAB — PHOSPHORUS: Phosphorus: 1.9 mg/dL — ABNORMAL LOW (ref 2.5–4.6)

## 2019-12-21 LAB — D-DIMER, QUANTITATIVE: D-Dimer, Quant: 0.41 ug/mL-FEU (ref 0.00–0.50)

## 2019-12-21 LAB — MAGNESIUM: Magnesium: 1.9 mg/dL (ref 1.7–2.4)

## 2019-12-21 MED ORDER — SODIUM PHOSPHATES 45 MMOLE/15ML IV SOLN
10.0000 mmol | Freq: Once | INTRAVENOUS | Status: AC
  Administered 2019-12-21: 10 mmol via INTRAVENOUS
  Filled 2019-12-21 (×2): qty 3.33

## 2019-12-21 NOTE — Progress Notes (Signed)
PROGRESS NOTE                                                                             PROGRESS NOTE                                                                                                                                                                                                             Patient Demographics:    Danielle [name], is a 74 y.o. female, DOB - 12/09/1945, GYI:[id number]  Outpatient Primary MD for the patient is [name], [name], MD    LOS - 2  Admit date - 12/19/2019    Chief Complaint  Patient presents with  . Weakness  . Fall       Brief Narrative     Danielle [name] is a 74 y.o. female with medical history significant of paroxysmal A. fib-on Coumadin, severe aortic stenosis s/p bioprosthetic AVR, asthma, breast cancer-on tamoxifen, COPD, coronary artery disease, hypertension, hypothyroidism, chronic systolic CHF, hyperlipidemia presents to emergency department for evaluation of generalized weakness and frequent falls since 1 week.  Patient tells me that she got lightheaded this morning while she was using her walker and fell back onto chair.  Denies loss of consciousness, head trauma, seizures, lightheadedness, dizziness prior to the fall.  Reports, cough, congestion, shortness of breath and generalized weakness since 1 week.  Her sister died recently and she has been depressed about that.  She denies leg swelling, orthopnea, PND, chest pain, abdominal pain, headache, blurry vision, abdominal pain, urinary or bowel changes.  No history of smoking, alcohol, illicit drug use.  She is sad about her sister's death.  Does not want to start on antidepressant at this time.  She denies active suicidal or homicidal thoughts.  She is fully vaccinated against COVID-19.  ED Course: Upon arrival to ED: Patient had fever of 101.2, tachypneic,  COVID-19 positive, CBC shows WBC of 3.6, platelet: 74, lactic  acid: WNL, UA, BC, UC: Pending, INR: 2.4, CMP shows AKI on CKD stage III, CT head  is negative for acute findings.  Chest x-ray shows postoperative change right upper lobe with subpleural ill-defined opacity in right upper lobe, concerning for potential developing pneumonia in this area.  Patient's oxygen saturation dropped in 90s with walking-placed on 2 L of oxygen via nasal cannula.  Patient was given IV fluid, Decadron and remdesivir in ED.  [facility] hospitalist consulted for admission due to COVID-19 pneumonia.   Subjective:    Danielle [name] today reports some generalized weakness, diarrhea, weakness and fatigue.      Assessment  & Plan :    Principal Problem:   Pneumonia due to COVID-19 virus Active Problems:   Essential hypertension   Hyperlipidemia   Paroxysmal atrial fibrillation (HCC)   Coronary artery disease   Thrombocytopenia (HCC)   COPD (chronic obstructive pulmonary disease) (HCC)   Aortic valve disease   COVID-19 infection -Patient presented with fever of 101.2, tachypneic, WBC of 3.6, lactic acid: WNL, COVID-19 positive.  R -Sepsis ruled out -No hypoxia, no indication for steroids and Remdesivir, has been stopped on admission. -she received  monoclonal antibody 10/21. -Continue to trend inflammatory markers . -She was encouraged to use incentive spirometry, flutter valve, she was encouraged out of bed to chair f she developed predatory failure related through her disease  COVID-19 Labs  Recent Labs    12/19/19 1545 12/19/19 1615 12/20/19 0642 12/21/19 0246  DDIMER 1.26*  --  0.42 0.41  FERRITIN  --  501* 520* 568*  LDH 383*  --   --   --   CRP  --  0.9 1.0* 0.8    Lab Results  Component Value Date   SARSCOV2NAA POSITIVE (A) 12/19/2019   SARSCOV2NAA NOT DETECTED 08/28/2018     Recurrent falls: -Multifactorial-physical deconditioning, Covid pneumonia, dehydration -CT head is negative for acute findings. -Consult PT/OT  Hypertension:   -Blood pressure is soft, will continue with Coreg and keep holding Entresto .  AKI on CKD stage IIIb: -Avoid nephrotoxic medication.  Monitor kidney function closely.  History of left breast cancer: Diagnosed in 2018 status post breast lumpectomy and adjuvant radiation. -Followed by oncology Dr. Burr Medico -Continue tamoxifen -She is scheduled for mammogram on December 27, 2019.  Leukopenia/thrombocytopenia: Chronic -Continue to monitor.  Hyperlipidemia: Continue statin  Hypophosphatemia -Replete with IV sodium phosphate  Hypothyroidism: Continue methimazole.   Coronary artery disease: Stable.  Continue aspirin, statin, Coreg.  Chronic systolic CHF/nonischemic cardiomyopathy: Patient appears euvolemic on exam. -Continue aspirin, statin,  and Coreg, Entresto has been stopped given soft blood pressure, Strict I&O's and daily weight.  Monitor signs of fluid overload.  -Blood pressure remains soft this morning so I will discontinue Lasix as well  History of severe aortic stenosis status post bioprosthetic AVR: Stable -Continue aspirin,   Paroxysmal A. fib: Rate controlled.  Reviewed EKG. -CHA2DS2-VASc score: 6 -Coumadin per pharmacy.  Continue Coreg.  Morbid obesity with BMI of 39: Encouraged weight loss, dietary modification.  CVA: Continue aspirin, statin.  Situational depression: -Due to recent death in family. -Supportive care.   Recent Labs  Lab 12/19/19 1153 12/19/19 1204 12/19/19 1545 12/19/19 1615 12/20/19 0642 12/21/19 0246  WBC 3.6*  --   --   --  2.6* 3.0*  PLT 74*  --   --   --  66* 74*  CRP  --   --   --  0.9 1.0* 0.8  BNP  --   --  76.4  --   --  93.2  DDIMER  --   --  1.26*  --  0.42 0.41  PROCALCITON  --   --  <0.10  --   --   --   AST 42*  --   --   --  37 32  ALT 47*  --   --   --  40 40  ALKPHOS 42  --   --   --  35* 31*  BILITOT 0.7  --   --   --  0.5 0.6  ALBUMIN 3.6  --   --   --  2.7* 2.5*  INR 2.4*  --   --   --  2.4* 2.7*   LATICACIDVEN 1.6  --   --   --   --   --   SARSCOV2NAA  --  POSITIVE*  --   --   --   --        ABG     Component Value Date/Time   PHART 7.425 12/04/2015 0436   PCO2ART 34.2 12/04/2015 0436   PO2ART 73.3 (L) 12/04/2015 0436   HCO3 22.0 12/04/2015 0436   ACIDBASEDEF 1.7 12/04/2015 0436   O2SAT 94.6 12/04/2015 0436        Condition - Extremely Guarded  Family Communication  : Discussed with spouse by phone  Code Status : DNR  Consults  :  none  Procedures  :  none  Disposition Plan  :    Status is: Inpatient  Remains inpatient appropriate because:Hemodynamically unstable   Dispo: The patient is from: Home              Anticipated d/c is to: Home              Anticipated d/c date is: 1 day              Patient currently is not medically stable to d/c.      DVT Prophylaxis  :  On warfarin  Lab Results  Component Value Date   PLT 74 (L) 12/21/2019    Diet :  Diet Order            Diet Heart Room service appropriate? Yes; Fluid consistency: Thin  Diet effective now                  Inpatient Medications  Scheduled Meds: . albuterol  2 puff Inhalation Q6H  . vitamin C  500 mg Oral Daily  . aspirin EC  81 mg Oral Daily  . carvedilol  25 mg Oral BID  . furosemide  20 mg Oral QODAY  . methimazole  15 mg Oral QPM  . mometasone-formoterol  2 puff Inhalation BID  . rosuvastatin  40 mg Oral Daily  . senna-docusate  2 tablet Oral QHS  . tamoxifen  20 mg Oral Daily  . traMADol  50 mg Oral TID  . warfarin  3 mg Oral q1600  . Warfarin - Pharmacist Dosing Inpatient   Does not apply q1600  . zinc sulfate  220 mg Oral Daily   Continuous Infusions: . sodium chloride    . famotidine (PEPCID) IV     PRN Meds:.sodium chloride, acetaminophen, albuterol, chlorpheniramine-HYDROcodone, diphenhydrAMINE, EPINEPHrine, famotidine (PEPCID) IV, guaiFENesin-dextromethorphan, methylPREDNISolone (SOLU-MEDROL) injection, ondansetron **OR** ondansetron (ZOFRAN)  IV  Antibiotics  :    Anti-infectives (From admission, onward)   Start     Dose/Rate Route Frequency Ordered Stop   12/20/19 1000  remdesivir 100 mg in sodium chloride 0.9 % 100 mL IVPB  Status:  Discontinued       "  Followed by" Linked Group Details   100 mg 200 mL/hr over 30 Minutes Intravenous Daily 12/19/19 1455 12/20/19 1050   12/20/19 1000  remdesivir 100 mg in sodium chloride 0.9 % 100 mL IVPB  Status:  Discontinued       "Followed by" Linked Group Details   100 mg 200 mL/hr over 30 Minutes Intravenous Daily 12/19/19 1502 12/19/19 1502   12/19/19 1515  remdesivir 200 mg in sodium chloride 0.9% 250 mL IVPB  Status:  Discontinued       "Followed by" Linked Group Details   200 mg 580 mL/hr over 30 Minutes Intravenous Once 12/19/19 1502 12/19/19 1502   12/19/19 1500  remdesivir 200 mg in sodium chloride 0.9% 250 mL IVPB       "Followed by" Linked Group Details   200 mg 580 mL/hr over 30 Minutes Intravenous Once 12/19/19 1455 12/19/19 1823        Danielle [name] M.D on 12/21/2019 at 1:53 PM  To page go to [email or web address] -    Objective:   Vitals:   12/20/19 1613 12/20/19 2101 12/21/19 0416 12/21/19 0715  BP: 118/76 92/66 (!) 90/59   Pulse:  98 97   Resp:  20 19 20   Temp: 98.3 F (36.8 C) 98.3 F (36.8 C) 98 F (36.7 C)   TempSrc: Oral Oral Oral   SpO2:  90% 91%   Weight:      Height:        Wt Readings from Last 3 Encounters:  12/19/19 113.6 kg  12/05/19 116.6 kg  11/28/19 116.6 kg     Intake/Output Summary (Last 24 hours) at 12/21/2019 1353 Last data filed at 12/20/2019 1700 Gross per 24 hour  Intake 120 ml  Output --  Net 120 ml     Physical Exam  Awake Alert, Oriented X 3, frail, no new F.N deficits, Normal affect Symmetrical Chest wall movement, Good air movement bilaterally, CTAB RRR,No Gallops,Rubs or new Murmurs, No Parasternal Heave +ve B.Sounds, Abd Soft, No tenderness, No rebound - guarding or rigidity. No Cyanosis, Clubbing or edema,  No new Rash or bruise       Data Review:    CBC Recent Labs  Lab 12/19/19 1153 12/20/19 0642 12/21/19 0246  WBC 3.6* 2.6* 3.0*  HGB 12.2 11.0* 10.7*  HCT 39.1 34.4* 32.9*  PLT 74* 66* 74*  MCV 98.0 95.6 95.1  MCH 30.6 30.6 30.9  MCHC 31.2 32.0 32.5  RDW 14.0 14.1 14.0  LYMPHSABS 0.8 0.6* 0.6*  MONOABS 0.4 0.2 0.5  EOSABS 0.0 0.0 0.0  BASOSABS 0.0 0.0 0.0    Recent Labs  Lab 12/19/19 1153 12/19/19 1545 12/19/19 1615 12/20/19 0642 12/21/19 0246  NA 136  --   --  137 136  K 4.6  --   --  4.2 4.2  CL 103  --   --  109 109  CO2 23  --   --  20* 22  GLUCOSE 107*  --   --  117* 138*  BUN 15  --   --  17 18  CREATININE 1.47*  --   --  1.31* 1.19*  CALCIUM 9.9  --   --  8.8* 8.7*  AST 42*  --   --  37 32  ALT 47*  --   --  40 40  ALKPHOS 42  --   --  35* 31*  BILITOT 0.7  --   --  0.5 0.6  ALBUMIN 3.6  --   --  2.7* 2.5*  MG  --   --   --  1.7 1.9  CRP  --   --  0.9 1.0* 0.8  DDIMER  --  1.26*  --  0.42 0.41  PROCALCITON  --  <0.10  --   --   --   LATICACIDVEN 1.6  --   --   --   --   INR 2.4*  --   --  2.4* 2.7*  BNP  --  76.4  --   --  93.2    ------------------------------------------------------------------------------------------------------------------ No results for input(s): CHOL, HDL, LDLCALC, TRIG, CHOLHDL, LDLDIRECT in the last 72 hours.  Lab Results  Component Value Date   HGBA1C 5.0 08/28/2018   ------------------------------------------------------------------------------------------------------------------ No results for input(s): TSH, T4TOTAL, T3FREE, THYROIDAB in the last 72 hours.  Invalid input(s): FREET3  Cardiac Enzymes No results for input(s): CKMB, TROPONINI, MYOGLOBIN in the last 168 hours.  Invalid input(s): CK ------------------------------------------------------------------------------------------------------------------    Component Value Date/Time   BNP 93.2 12/21/2019 0246    Micro Results Recent Results (from the  past 240 hour(s))  Blood culture (routine single)     Status: None (Preliminary result)   Collection Time: 12/19/19 11:40 AM   Specimen: BLOOD RIGHT HAND  Result Value Ref Range Status   Specimen Description BLOOD RIGHT HAND  Final   Special Requests   Final    BOTTLES DRAWN AEROBIC AND ANAEROBIC Blood Culture results may not be optimal due to an inadequate volume of blood received in culture bottles   Culture   Final    NO GROWTH 2 DAYS Performed at [facility], [place] [address] [place], [place] [address]    Report Status PENDING  Incomplete  Respiratory Panel by RT PCR (Flu A&B, Covid) - Nasopharyngeal Swab     Status: Abnormal   Collection Time: 12/19/19 12:04 PM   Specimen: Nasopharyngeal Swab  Result Value Ref Range Status   SARS Coronavirus 2 by RT PCR POSITIVE (A) NEGATIVE Final    Comment: emailed L. Berdik RN 13:25 12/19/19 ([email or web address]) (NOTE) SARS-CoV-2 target nucleic acids are DETECTED.  SARS-CoV-2 RNA is generally detectable in upper respiratory specimens  during the acute phase of infection. Positive results are indicative of the presence of the identified virus, but do not rule out bacterial infection or co-infection with other pathogens not detected by the test. Clinical correlation with patient history and other diagnostic information is necessary to determine patient infection status. The expected result is Negative.  Fact Sheet for Patients:  PinkCheek.be  Fact Sheet for Healthcare Providers: GravelBags.it  This test is not yet approved or cleared by the [place] FDA and  has been authorized for detection and/or diagnosis of SARS-CoV-2 by FDA under an Emergency Use Authorization (EUA).  This EUA will remain in effect (meaning this test can be used) for the duration of  the COVID-19  declaration under Section 564(b)(1) of the Act, 21 U.S.C. section 360bbb-3(b)(1), unless the authorization  is terminated or revoked sooner.      Influenza A by PCR NEGATIVE NEGATIVE Final   Influenza B by PCR NEGATIVE NEGATIVE Final    Comment: (NOTE) The Xpert Xpress SARS-CoV-2/FLU/RSV assay is intended as an aid in  the diagnosis of influenza from Nasopharyngeal swab specimens and  should not be used as a sole basis for treatment. Nasal washings and  aspirates are unacceptable for Xpert Xpress SARS-CoV-2/FLU/RSV  testing.  Fact Sheet for Patients: PinkCheek.be  Fact Sheet for Healthcare Providers: GravelBags.it  This test is not yet approved or cleared by the [place] and  has been authorized for detection and/or diagnosis of SARS-CoV-2 by  FDA under an Emergency Use Authorization (EUA). This EUA will remain  in effect (meaning this test can be used) for the duration of the  Covid-19 declaration under Section 564(b)(1) of the Act, 21  U.S.C. section 360bbb-3(b)(1), unless the authorization is  terminated or revoked. Performed at [facility], [place] [address] [place], [place] [address]     Radiology Reports CT Head Wo Contrast  Result Date: 12/19/2019 CLINICAL DATA:  Weakness, head injury after multiple falls. EXAM: CT HEAD WITHOUT CONTRAST TECHNIQUE: Contiguous axial images were obtained from the base of the skull through the vertex without intravenous contrast. COMPARISON:  None. FINDINGS: Brain: Mild chronic ischemic white matter disease is noted. 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 No mass effect or midline shift is noted. Ventricular size is within normal limits. There is no evidence of mass lesion, hemorrhage or acute infarction. Vascular: No hyperdense vessel or unexpected calcification. Skull: Normal. Negative for fracture or focal lesion. Sinuses/Orbits: No acute finding. Other: None. IMPRESSION: Mild chronic ischemic white matter disease. 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 No acute intracranial abnormality seen. Electronically Signed   By: Marijo Conception M.D.   On: 12/19/2019 12:43   DG Chest Port 1 View  Result Date: 12/21/2019 CLINICAL DATA:  Cough.  COVID-19 EXAM: PORTABLE CHEST 1 VIEW COMPARISON:  Two days ago FINDINGS: Continued low volume chest with mild interstitial predominant opacity on both sides. No visible effusion or recurrent pneumothorax. Postoperative right apex. Partially obscured heart size without interval change. Aortic tortuosity. IMPRESSION: Patchy interstitial opacity from chronic lung disease or atypical pneumonia. No change from 2 days ago. Electronically Signed   By: Monte Fantasia M.D.   On: 12/21/2019 10:00   DG Chest Port 1 View  Result Date: 12/19/2019 CLINICAL DATA:  Cough and weakness EXAM: PORTABLE CHEST 1 VIEW COMPARISON:  December 23, 2015. FINDINGS: There is postoperative change in the right upper lobe with ill-defined airspace opacity in the right upper lobe. There is bibasilar atelectasis. Heart is mildly enlarged with pulmonary vascularity normal. Patient is status post median sternotomy. There is aortic atherosclerosis. Surgical clips noted in the lateral left breast region. IMPRESSION: Postoperative change right upper lobe with subtle ill-defined opacity in the right upper lobe, concerning for potential developing pneumonia in this area. Mild bibasilar atelectasis. Lungs elsewhere clear. The changes in the right upper lobe could be indicative of atypical organism pneumonia. Check of COVID-19 status in this regard advised. No consolidation evident. Heart is mildly enlarged with postoperative change. Aortic Atherosclerosis (ICD10-I70.0). Electronically Signed   By: Lowella Grip III M.D.   On: 12/19/2019 11:05   ECHOCARDIOGRAM COMPLETE  Result Date: 11/21/2019    ECHOCARDIOGRAM REPORT   Patient Name:   ILANNA DEIHL Date of Exam: 11/21/2019 Medical Rec #:  [id number]                   Height:       67.0  in Accession #:    [id number]                  Weight:       258.2 lb Date of Birth:  06-22-1945                  BSA:  2.254 m Patient Age:    68 years                    BP:           118/82 mmHg Patient Gender: F                           HR:           80 bpm. Exam Location:  [facility] Procedure: 2D Echo, Cardiac Doppler, Color Doppler and Intracardiac            Opacification Agent Indications:    I35.8 Other nonrheumatic aortic valve disorders  History:        Patient has prior history of Echocardiogram examinations, most                 recent 01/18/2019. Cardiomyopathy and CHF, CAD, COPD and Stroke,                 Aortic Valve Disease, Arrythmias:Atrial Fibrillation; Risk                 Factors:Former Smoker, Hypertension and Dyslipidemia.  Sonographer:    Pilar Jarvis RDMS, RVT, RDCS Referring Phys: [id number] Rise Mu  Sonographer Comments: Technically difficult study due to poor echo windows and patient is morbidly obese. Definity helped images somewhat IMPRESSIONS  1. Porcine AVR, Procedure Date: 2015.  2. Left ventricular ejection fraction, by estimation, is 45 to 50%. The left ventricle has mildly decreased function. The left ventricle demonstrates global hypokinesis. There is mild left ventricular hypertrophy. Left ventricular diastolic function could not be evaluated.  3. Right ventricular systolic function is normal. The right ventricular size is normal.  4. The mitral valve is grossly normal. No evidence of mitral valve regurgitation. FINDINGS  Left Ventricle: Left ventricular ejection fraction, by estimation, is 45 to 50%. The left ventricle has mildly decreased function. The left ventricle demonstrates global hypokinesis. Definity contrast agent was given IV to delineate the left ventricular  endocardial borders. The left ventricular internal cavity size was normal in size. There is mild left ventricular hypertrophy. Left ventricular diastolic function could not be evaluated. Right  Ventricle: The right ventricular size is normal. No increase in right ventricular wall thickness. Right ventricular systolic function is normal. Left Atrium: Left atrial size was normal in size. Right Atrium: Right atrial size was not well visualized. Pericardium: There is no evidence of pericardial effusion. Mitral Valve: The mitral valve is grossly normal. No evidence of mitral valve regurgitation. Tricuspid Valve: The tricuspid valve is not well visualized. Tricuspid valve regurgitation is not demonstrated. Aortic Valve: The aortic valve was not well visualized. Aortic valve regurgitation is not visualized. Aortic valve mean gradient measures 9.7 mmHg. Aortic valve peak gradient measures 18.0 mmHg. Aortic valve area, by VTI measures 1.32 cm. There is a porcine valve present in the aortic position. Pulmonic Valve: The pulmonic valve was not well visualized. Pulmonic valve regurgitation is not visualized. Aorta: The aortic root is normal in size and structure. Venous: The inferior vena cava was not well visualized. IAS/Shunts: The interatrial septum was not assessed.  LEFT VENTRICLE PLAX 2D LVIDd:         4.00 cm LVIDs:         3.40 cm LV PW:         1.20 cm LV IVS:        1.20 cm LVOT diam:  2.20 cm LV SV:         53 LV SV Index:   24 LVOT Area:     3.80 cm  LEFT ATRIUM         Index LA diam:    4.10 cm 1.82 cm/m  AORTIC VALVE AV Area (Vmax):    1.50 cm AV Area (Vmean):   1.26 cm AV Area (VTI):     1.32 cm AV Vmax:           212.33 cm/s AV Vmean:          142.333 cm/s AV VTI:            0.403 m AV Peak Grad:      18.0 mmHg AV Mean Grad:      9.7 mmHg LVOT Vmax:         83.60 cm/s LVOT Vmean:        47.000 cm/s LVOT VTI:          0.140 m LVOT/AV VTI ratio: 0.35  AORTA Ao Root diam: 3.40 cm Ao Arch diam: 2.7 cm  SHUNTS Systemic VTI:  0.14 m Systemic Diam: 2.20 cm Kate Sable MD Electronically signed by Kate Sable MD Signature Date/Time: 11/21/2019/4:11:17 PM    Final

## 2019-12-21 NOTE — TOC Initial Note (Addendum)
Transition of Care [facility]) - Initial/Assessment Note    Patient Details  Name: Danielle [name] MRN: [id number] Date of Birth: Mar 29, 1945  Transition of Care [facility]) CM/SW Contact:    Verdell Carmine, RN Phone Number: 12/21/2019, 3:57 PM  Clinical Narrative:                 Patient in with [facility]- patient has a history of quite a few admissions. Called room no answer. Called husband at home. She has had [facility] last for home health, they liked them. Prefer them to come for PT OT. , she has a wheelchair and walker already at home. Her husband can pick her up when discharged, address verified as correct in chart.  Reached out to [facility] for [facility] PT OT. Waiting to hear back. CM will follow for further orders. Plan to discharge tomorrow. Tele appointment made with primary care MD. Husband states they have had tele visits before. Listed in patient instructions.  Expected Discharge Plan: [place] Barriers to Discharge: Continued Medical Work up   Patient Goals and CMS Choice Patient states their goals for this hospitalization and ongoing recovery are:: go home   Choice offered to / list presented to : Patient  Expected Discharge Plan and Services Expected Discharge Plan: [place]   Discharge Planning Services: CM Consult Post Acute Care Choice: [place] arrangements for the past 2 months: Single Family Home                                      Prior Living Arrangements/Services Living arrangements for the past 2 months: Single Family Home Lives with:: Spouse Patient language and need for interpreter reviewed:: Yes Do you feel safe going back to the place where you live?: Yes      Need for Family Participation in Patient Care: Yes (Comment) Care giver support system in place?: Yes (comment) Current home services: DME (walker, wheelchair) Criminal Activity/Legal Involvement Pertinent to Current Situation/Hospitalization:  No - Comment as needed  Activities of Daily Living Home Assistive Devices/Equipment: Gilford Rile (specify type) ADL Screening (condition at time of admission) Patient's cognitive ability adequate to safely complete daily activities?: Yes Is the patient deaf or have difficulty hearing?: No Does the patient have difficulty seeing, even when wearing glasses/contacts?: No Does the patient have difficulty concentrating, remembering, or making decisions?: No Patient able to express need for assistance with ADLs?: Yes Does the patient have difficulty dressing or bathing?: No Independently performs ADLs?: Yes (appropriate for developmental age) Does the patient have difficulty walking or climbing stairs?: Yes Weakness of Legs: Both Weakness of Arms/Hands: Both  Permission Sought/Granted   Permission granted to share information with : Yes, Verbal Permission Granted              Emotional Assessment         Alcohol / Substance Use: Not Applicable Psych Involvement: No (comment)  Admission diagnosis:  Acute respiratory failure with hypoxia ([place]) [J96.01] Pneumonia due to COVID-19 virus [U07.1, J12.82] COVID-19 [U07.1] Patient Active Problem List   Diagnosis Date Noted  . Pneumonia due to COVID-19 virus   . Bacteremia due to Gram-positive bacteria 08/29/2018  . Abscess   . Cellulitis of left breast 08/28/2018  . Cellulitis of left abdominal wall 08/28/2018  . Encounter for routine gynecological examination 04/28/2018  . CKD (chronic kidney disease), stage III ([place]) 09/27/2017  .  Recurrent genital herpes 04/13/2017  . Chronic pain disorder 01/25/2017  . Malignant neoplasm of upper-outer quadrant of left breast in female, estrogen receptor positive ([place]) 12/16/2016  . Chronic knee pain 12/07/2016  . Chronic bilateral low back pain without sciatica 12/07/2016  . Peripheral musculoskeletal gait disorder 12/07/2016  . Encounter for therapeutic drug monitoring 06/16/2016  . Generalized  osteoarthritis of multiple sites 05/04/2016  . Chronic anticoagulation 05/04/2016  . Stroke ([place]) 04/14/2016  . Aortic valve disease 04/10/2016  . S/P AVR 04/10/2016  . NICM (nonischemic cardiomyopathy) ([place]) 04/10/2016  . Upper airway cough syndrome 03/14/2016  . Morbid obesity ([place]) 03/14/2016  . COPD (chronic obstructive pulmonary disease) ([place]) 03/11/2016  . Thrombocytopenia ([place]) 12/12/2015  . Anemia 12/12/2015  . Vitamin D deficiency 12/12/2015  . Acute urinary retention 12/11/2015  . Osteoporosis 12/11/2015  . Hip fracture ([place]) 12/10/2015  . Aortic atherosclerosis ([place]) 12/10/2015  . Lung blebs ([place]) 12/03/2015  . Pelvic fracture ([place]) 08/19/2015  . Fall at home 08/19/2015  . Essential hypertension 08/19/2015  . Hyperlipidemia 08/19/2015  . Asthma 08/19/2015  . Paroxysmal atrial fibrillation ([place]) 08/19/2015  . Hyperthyroidism 08/19/2015  . Chronic systolic (congestive) heart failure ([place])   . Coronary artery disease   . Aortic stenosis    PCP:  [name], [name], MD Pharmacy:   [facility] [place] - [address] [place] [address] Phone: [phone] Fax: [place], [address], [address] [place], [address] [place] [address] Phone: [phone] Fax: [phone]     Social Determinants of Health (SDOH) Interventions    Readmission Risk Interventions No flowsheet data found.

## 2019-12-21 NOTE — Progress Notes (Signed)
ANTICOAGULATION CONSULT NOTE - Follow Up Consult  Pharmacy Consult for Warfarin Indication: atrial fibrillation  Allergies  Allergen Reactions  . Lisinopril Cough  . Tetanus Toxoid Adsorbed Swelling    Arm swelling  . Other Cough    [place]    Patient Measurements: Height: 5\' 7"  (170.2 cm) Weight: 113.6 kg (250 lb 7.1 oz) IBW/kg (Calculated) : 61.6  Vital Signs: Temp: 98 F (36.7 C) (10/22 0416) Temp Source: Oral (10/22 0416) BP: 90/59 (10/22 0416) Pulse Rate: 97 (10/22 0416)  Labs: Recent Labs    12/19/19 1153 12/19/19 1153 12/19/19 1545 12/19/19 1815 12/20/19 0642 12/21/19 0246  HGB 12.2   < >  --   --  11.0* 10.7*  HCT 39.1  --   --   --  34.4* 32.9*  PLT 74*  --   --   --  66* 74*  APTT 51*  --   --   --   --   --   LABPROT 25.0*  --   --   --  25.4* 28.0*  INR 2.4*  --   --   --  2.4* 2.7*  CREATININE 1.47*  --   --   --  1.31* 1.19*  TROPONINIHS  --   --  14 15  --   --    < > = values in this interval not displayed.    Estimated Creatinine Clearance: 54.8 mL/min (A) (by C-G formula based on SCr of 1.19 mg/dL (H)).  Assessment:  74 yr old female on warfarin as prior to admission for atrial fibrillation.  Recurrent falls noted. Chronic thrombocytopenia.    INR remains therapeutic (2.7) on home warfarin regimen of 3 mg daily, though 10/21 dose appears to have been missed. No bleeding reported.  Goal of Therapy:  INR 2-3 Monitor platelets by anticoagulation protocol: Yes   Plan:  Continue Warfarin 3 mg daily. Daily PT/INR for now. Follow CBC. Monitor for any s/sx bleeding.  Arty Baumgartner, [place] Phone: [phone] 12/21/2019,11:06 AM

## 2019-12-21 NOTE — Progress Notes (Signed)
Occupational Therapy Treatment Patient Details Name: Danielle [name] MRN: [id number] DOB: [birth year] Today's Date: 12/21/2019    History of present illness Pt is 74 yo female with PMH including afib, aortic stenosis s/p AVR, asthma, breast CA, COPD, CAD, HTN, hypothyroidism, CHF, and hyperlipidemia.  Pt presented to ED from falls and weakness.  Pt found to have sepsis and resp failure due to COVID 19 PNE.  Pt additional reports situational depression due to sister passing away on Monday.   OT comments  Pt progressing towards established OT goals. Providing pt with handout and education on energy conservation techniques for ADLs. Pt verbalized understanding and ways she will implement techniques into daily routine. Pt requesting to perform sponge bath at sink. Pt performing UB bathing with Min A and LB bathing with Supervision-Min Guard A practicing EC techniques. During functional mobility to/from sink, HR elevating to 126 and RR to 48. While seated SpO2 90s with good pleth line. However, during mobility, unable to get good reading and unsure of SpO2 with activity. Continue to recommend dc to home with HHOT and will continue to follow acutely as admitted.    Follow Up Recommendations  Home health OT;Supervision/Assistance - 24 hour ; Binger aide   Equipment Recommendations  None recommended by OT    Recommendations for Other Services PT consult    Precautions / Restrictions Precautions Precautions: Fall       Mobility Bed Mobility Overal bed mobility: Needs Assistance             General bed mobility comments: in chair at arrival  Transfers Overall transfer level: Needs assistance Equipment used: Rolling walker (2 wheeled) Transfers: Sit to/from Stand Sit to Stand: Min guard         General transfer comment: Min Guard A for safety. Increased time and effort to power up into standing    Balance Overall balance assessment: Needs assistance Sitting-balance  support: No upper extremity supported Sitting balance-Leahy Scale: Good     Standing balance support: Bilateral upper extremity supported;During functional activity Standing balance-Leahy Scale: Poor Standing balance comment: used RW                           ADL either performed or assessed with clinical judgement   ADL Overall ADL's : Needs assistance/impaired     Grooming: Supervision/safety;Set up;Sitting;Wash/dry face   Upper Body Bathing: Minimal assistance;Sitting Upper Body Bathing Details (indicate cue type and reason): washing her back; her husband usually helps at home. Pt able to wash everywhere else at supervision Lower Body Bathing: Supervison/ safety;Sit to/from stand;Min guard Lower Body Bathing Details (indicate cue type and reason): Min Guard A with sit<>Stand. Superviison for safety with standing. Performing peri care Upper Body Dressing : Supervision/safety;Set up;Sitting Upper Body Dressing Details (indicate cue type and reason): Donning new gown     Toilet Transfer: Min guard;Ambulation;RW (simulated to chair) Toilet Transfer Details (indicate cue type and reason): Min Guard A for safety         Functional mobility during ADLs: Min guard;Rolling walker General ADL Comments: Continues to present with decreased actiivty tolerance requiring increased time and rest breaks. Provided EC handout and education. Pt verbalized ways she implements into daily routine.      Vision       Perception     Praxis      Cognition Arousal/Alertness: Awake/alert Behavior During Therapy: WFL for tasks assessed/performed Overall Cognitive Status: Within Functional Limits for tasks assessed  General Comments: Slight flat affect and seems more sad/depressed than yesterday. Very agreeable to therapy. Just sad about sister. By end of session, pt smiling and laughing a coupel of times.         Exercises      Shoulder Instructions       General Comments HR elevating to 126 and RR 48 with mobility. SpO2 unable to read with poor pleth (saying in 60s). When at rest, good pleth returns and SpO2 >88%.     Pertinent Vitals/ Pain       Pain Assessment: No/denies pain  Home Living                                          Prior Functioning/Environment              Frequency  Min 2X/week        Progress Toward Goals  OT Goals(current goals can now be found in the care plan section)  Progress towards OT goals: Progressing toward goals  Acute Rehab OT Goals Patient Stated Goal: "My goal is to walk on my own" OT Goal Formulation: With patient Time For Goal Achievement: 01/03/20 Potential to Achieve Goals: Good ADL Goals Pt Will Perform Grooming: with modified independence;standing;sitting Pt Will Perform Upper Body Dressing: with modified independence;sitting Pt Will Transfer to Toilet: with modified independence;ambulating;bedside commode Pt Will Perform Toileting - Clothing Manipulation and hygiene: with modified independence;sitting/lateral leans;sit to/from stand Additional ADL Goal #1: Pt will independently verbalize three energy conservation techniques for ADLs Additional ADL Goal #2: Pt will independently monitor SpO2 and use purse lip breathing for ADLs  Plan Discharge plan remains appropriate    Co-evaluation                 AM-PAC OT "6 Clicks" Daily Activity     Outcome Measure   Help from another person eating meals?: A Little Help from another person taking care of personal grooming?: A Little Help from another person toileting, which includes using toliet, bedpan, or urinal?: A Little Help from another person bathing (including washing, rinsing, drying)?: A Little Help from another person to put on and taking off regular upper body clothing?: A Little Help from another person to put on and taking off regular lower body clothing?: A  Little 6 Click Score: 18    End of Session Equipment Utilized During Treatment: Rolling walker;Oxygen  OT Visit Diagnosis: Unsteadiness on feet (R26.81);Other abnormalities of gait and mobility (R26.89);Muscle weakness (generalized) (M62.81)   Activity Tolerance Patient tolerated treatment well   Patient Left in chair;with call bell/phone within reach   Nurse Communication Mobility status        Time: 5997-7414 OT Time Calculation (min): 44 min  Charges: OT General Charges $OT Visit: 1 Visit OT Treatments $Self Care/Home Management : 38-52 mins  [place], OTR/L Acute Rehab Pager: [phone] Office: [place] 12/21/2019, 4:45 PM

## 2019-12-21 NOTE — Discharge Instructions (Addendum)
Information on my medicine - Coumadin   (Warfarin)  Why was Coumadin prescribed for you? Coumadin was prescribed for you because you have a blood clot or a medical condition that can cause an increased risk of forming blood clots. Blood clots can cause serious health problems by blocking the flow of blood to the heart, lung, or brain. Coumadin can prevent harmful blood clots from forming. As a reminder your indication for Coumadin is:   Stroke Prevention Because Of Atrial Fibrillation  What test will check on my response to Coumadin? While on Coumadin (warfarin) you will need to have an INR test regularly to ensure that your dose is keeping you in the desired range. The INR (international normalized ratio) number is calculated from the result of the laboratory test called prothrombin time (PT).  If an INR APPOINTMENT HAS NOT ALREADY BEEN MADE FOR YOU please schedule an appointment to have this lab work done by your health care provider within 7 days. Your INR goal is usually a number between:  2 to 3 or your provider may give you a more narrow range like 2-2.5.  Ask your health care provider during an office visit what your goal INR is.  What  do you need to  know  About  COUMADIN? Take Coumadin (warfarin) exactly as prescribed by your healthcare provider about the same time each day.  DO NOT stop taking without talking to the doctor who prescribed the medication.  Stopping without other blood clot prevention medication to take the place of Coumadin may increase your risk of developing a new clot or stroke.  Get refills before you run out.  What do you do if you miss a dose? If you miss a dose, take it as soon as you remember on the same day then continue your regularly scheduled regimen the next day.  Do not take two doses of Coumadin at the same time.  Important Safety Information A possible side effect of Coumadin (Warfarin) is an increased risk of bleeding. You should call your healthcare  provider right away if you experience any of the following: ? Bleeding from an injury or your nose that does not stop. ? Unusual colored urine (red or dark brown) or unusual colored stools (red or black). ? Unusual bruising for unknown reasons. ? A serious fall or if you hit your head (even if there is no bleeding).  Some foods or medicines interact with Coumadin (warfarin) and might alter your response to warfarin. To help avoid this: ? Eat a balanced diet, maintaining a consistent amount of Vitamin K. ? Notify your provider about major diet changes you plan to make. ? Avoid alcohol or limit your intake to 1 drink for women and 2 drinks for men per day. (1 drink is 5 oz. wine, 12 oz. beer, or 1.5 oz. liquor.)  Make sure that ANY health care provider who prescribes medication for you knows that you are taking Coumadin (warfarin).  Also make sure the healthcare provider who is monitoring your Coumadin knows when you have started a new medication including herbals and non-prescription products.  Coumadin (Warfarin)  Major Drug Interactions  Increased Warfarin Effect Decreased Warfarin Effect  Alcohol (large quantities) Antibiotics (esp. Septra/Bactrim, Flagyl, Cipro) Amiodarone (Cordarone) Aspirin (ASA) Cimetidine (Tagamet) Megestrol (Megace) NSAIDs (ibuprofen, naproxen, etc.) Piroxicam (Feldene) Propafenone (Rythmol SR) Propranolol (Inderal) Isoniazid (INH) Posaconazole (Noxafil) Barbiturates (Phenobarbital) Carbamazepine (Tegretol) Chlordiazepoxide (Librium) Cholestyramine (Questran) Griseofulvin Oral Contraceptives Rifampin Sucralfate (Carafate) Vitamin K   Coumadin (Warfarin) Major Herbal  Interactions  Increased Warfarin Effect Decreased Warfarin Effect  Garlic Ginseng Ginkgo biloba Coenzyme Q10 Green tea St. John's wort    Coumadin (Warfarin) FOOD Interactions  Eat a consistent number of servings per week of foods HIGH in Vitamin K (1 serving =  cup)  Collards  (cooked, or boiled & drained) Kale (cooked, or boiled & drained) Mustard greens (cooked, or boiled & drained) Parsley *serving size only =  cup Spinach (cooked, or boiled & drained) Swiss chard (cooked, or boiled & drained) Turnip greens (cooked, or boiled & drained)  Eat a consistent number of servings per week of foods MEDIUM-HIGH in Vitamin K (1 serving = 1 cup)  Asparagus (cooked, or boiled & drained) Broccoli (cooked, boiled & drained, or raw & chopped) Brussel sprouts (cooked, or boiled & drained) *serving size only =  cup Lettuce, raw (green leaf, endive, romaine) Spinach, raw Turnip greens, raw & chopped   These websites have more information on Coumadin (warfarin):  FailFactory.se; VeganReport.com.au;       Person Under Monitoring Name: Danielle [name] Begin  Location: [facility] [address]   Infection Prevention Recommendations for Individuals Confirmed to have, or Being Evaluated for, 2019 Novel Coronavirus (COVID-19) Infection Who Receive Care at Home  Individuals who are confirmed to have, or are being evaluated for, COVID-19 should follow the prevention steps below until a healthcare provider or local or state health department says they can return to normal activities.  Stay home except to get medical care You should restrict activities outside your home, except for getting medical care. Do not go to work, school, or public areas, and do not use public transportation or taxis.  Call ahead before visiting your doctor Before your medical appointment, call the healthcare provider and tell them that you have, or are being evaluated for, COVID-19 infection. This will help the healthcare provider's office take steps to keep other people from getting infected. Ask your healthcare provider to call the local or state health department.  Monitor your symptoms Seek prompt medical attention if your illness is worsening  (e.g., difficulty breathing). Before going to your medical appointment, call the healthcare provider and tell them that you have, or are being evaluated for, COVID-19 infection. Ask your healthcare provider to call the local or state health department.  Wear a facemask You should wear a facemask that covers your nose and mouth when you are in the same room with other people and when you visit a healthcare provider. People who live with or visit you should also wear a facemask while they are in the same room with you.  Separate yourself from other people in your home As much as possible, you should stay in a different room from other people in your home. Also, you should use a separate bathroom, if available.  Avoid sharing household items You should not share dishes, drinking glasses, cups, eating utensils, towels, bedding, or other items with other people in your home. After using these items, you should wash them thoroughly with soap and water.  Cover your coughs and sneezes Cover your mouth and nose with a tissue when you cough or sneeze, or you can cough or sneeze into your sleeve. Throw used tissues in a lined trash can, and immediately wash your hands with soap and water for at least 20 seconds or use an alcohol-based hand rub.  Wash your [facility] your hands often and thoroughly with soap and water for at least 20 seconds. You can use  an alcohol-based hand sanitizer if soap and water are not available and if your hands are not visibly dirty. Avoid touching your eyes, nose, and mouth with unwashed hands.   Prevention Steps for Caregivers and Household Members of Individuals Confirmed to have, or Being Evaluated for, COVID-19 Infection Being Cared for in the Home  If you live with, or provide care at home for, a person confirmed to have, or being evaluated for, COVID-19 infection please follow these guidelines to prevent infection:  Follow healthcare provider's  instructions Make sure that you understand and can help the patient follow any healthcare provider instructions for all care.  Provide for the patient's basic needs You should help the patient with basic needs in the home and provide support for getting groceries, prescriptions, and other personal needs.  Monitor the patient's symptoms If they are getting sicker, call his or her medical provider and tell them that the patient has, or is being evaluated for, COVID-19 infection. This will help the healthcare provider's office take steps to keep other people from getting infected. Ask the healthcare provider to call the local or state health department.  Limit the number of people who have contact with the patient  If possible, have only one caregiver for the patient.  Other household members should stay in another home or place of residence. If this is not possible, they should stay  in another room, or be separated from the patient as much as possible. Use a separate bathroom, if available.  Restrict visitors who do not have an essential need to be in the home.  Keep older adults, very young children, and other sick people away from the patient Keep older adults, very young children, and those who have compromised immune systems or chronic health conditions away from the patient. This includes people with chronic heart, lung, or kidney conditions, diabetes, and cancer.  Ensure good ventilation Make sure that shared spaces in the home have good air flow, such as from an air conditioner or an opened window, weather permitting.  Wash your hands often  Wash your hands often and thoroughly with soap and water for at least 20 seconds. You can use an alcohol based hand sanitizer if soap and water are not available and if your hands are not visibly dirty.  Avoid touching your eyes, nose, and mouth with unwashed hands.  Use disposable paper towels to dry your hands. If not available, use  dedicated cloth towels and replace them when they become wet.  Wear a facemask and gloves  Wear a disposable facemask at all times in the room and gloves when you touch or have contact with the patient's blood, body fluids, and/or secretions or excretions, such as sweat, saliva, sputum, nasal mucus, vomit, urine, or feces.  Ensure the mask fits over your nose and mouth tightly, and do not touch it during use.  Throw out disposable facemasks and gloves after using them. Do not reuse.  Wash your hands immediately after removing your facemask and gloves.  If your personal clothing becomes contaminated, carefully remove clothing and launder. Wash your hands after handling contaminated clothing.  Place all used disposable facemasks, gloves, and other waste in a lined container before disposing them with other household waste.  Remove gloves and wash your hands immediately after handling these items.  Do not share dishes, glasses, or other household items with the patient  Avoid sharing household items. You should not share dishes, drinking glasses, cups, eating utensils, towels, bedding, or  other items with a patient who is confirmed to have, or being evaluated for, COVID-19 infection.  After the person uses these items, you should wash them thoroughly with soap and water.  Wash laundry thoroughly  Immediately remove and wash clothes or bedding that have blood, body fluids, and/or secretions or excretions, such as sweat, saliva, sputum, nasal mucus, vomit, urine, or feces, on them.  Wear gloves when handling laundry from the patient.  Read and follow directions on labels of laundry or clothing items and detergent. In general, wash and dry with the warmest temperatures recommended on the label.  Clean all areas the individual has used often  Clean all touchable surfaces, such as counters, tabletops, doorknobs, bathroom fixtures, toilets, phones, keyboards, tablets, and bedside tables, every  day. Also, clean any surfaces that may have blood, body fluids, and/or secretions or excretions on them.  Wear gloves when cleaning surfaces the patient has come in contact with.  Use a diluted bleach solution (e.g., dilute bleach with 1 part bleach and 10 parts water) or a household disinfectant with a label that says EPA-registered for coronaviruses. To make a bleach solution at home, add 1 tablespoon of bleach to 1 quart (4 cups) of water. For a larger supply, add  cup of bleach to 1 gallon (16 cups) of water.  Read labels of cleaning products and follow recommendations provided on product labels. Labels contain instructions for safe and effective use of the cleaning product including precautions you should take when applying the product, such as wearing gloves or eye protection and making sure you have good ventilation during use of the product.  Remove gloves and wash hands immediately after cleaning.  Monitor yourself for signs and symptoms of illness Caregivers and household members are considered close contacts, should monitor their health, and will be asked to limit movement outside of the home to the extent possible. Follow the monitoring steps for close contacts listed on the symptom monitoring form.   ? If you have additional questions, contact your local health department or call the epidemiologist on call at [phone] (available 24/7). ? This guidance is subject to change. For the most up-to-date guidance from [facility], please refer to their website: YouBlogs.pl

## 2019-12-21 NOTE — TOC Progression Note (Signed)
Transition of Care [facility]) - Progression Note    Patient Details  Name: Annissa Andreoni MRN: [id number] Date of Birth: 07/05/1945  [facility] CM/SW [place], RN Phone Number: 12/21/2019, 5:12 PM  Clinical Narrative:    Home Health set up with [facility] for PT and OT Will continue to follow for needs   Expected Discharge Plan: [place] Barriers to Discharge: Continued Medical Work up  Expected Discharge Plan and Services Expected Discharge Plan: [place]   Discharge Planning Services: CM Consult Post Acute Care Choice: [place] arrangements for the past 2 months: Single Family Home                           HH Arranged: PT, OT [facility] Agency: [place] Date [place]: 12/21/19 Time HH Agency Contacted: [id number] Representative spoke with at [place]: [place] (SDOH) Interventions    Readmission Risk Interventions No flowsheet data found.

## 2019-12-22 DIAGNOSIS — I2583 Coronary atherosclerosis due to lipid rich plaque: Secondary | ICD-10-CM

## 2019-12-22 DIAGNOSIS — I2510 Atherosclerotic heart disease of native coronary artery without angina pectoris: Secondary | ICD-10-CM

## 2019-12-22 LAB — CBC WITH DIFFERENTIAL/PLATELET
Abs Immature Granulocytes: 0.01 10*3/uL (ref 0.00–0.07)
Basophils Absolute: 0 10*3/uL (ref 0.0–0.1)
Basophils Relative: 0 %
Eosinophils Absolute: 0 10*3/uL (ref 0.0–0.5)
Eosinophils Relative: 1 %
HCT: 35.2 % — ABNORMAL LOW (ref 36.0–46.0)
Hemoglobin: 11.5 g/dL — ABNORMAL LOW (ref 12.0–15.0)
Immature Granulocytes: 0 %
Lymphocytes Relative: 27 %
Lymphs Abs: 1.3 10*3/uL (ref 0.7–4.0)
MCH: 30.7 pg (ref 26.0–34.0)
MCHC: 32.7 g/dL (ref 30.0–36.0)
MCV: 94.1 fL (ref 80.0–100.0)
Monocytes Absolute: 0.4 10*3/uL (ref 0.1–1.0)
Monocytes Relative: 8 %
Neutro Abs: 3 10*3/uL (ref 1.7–7.7)
Neutrophils Relative %: 64 %
Platelets: 83 10*3/uL — ABNORMAL LOW (ref 150–400)
RBC: 3.74 MIL/uL — ABNORMAL LOW (ref 3.87–5.11)
RDW: 14 % (ref 11.5–15.5)
WBC: 4.7 10*3/uL (ref 4.0–10.5)
nRBC: 0 % (ref 0.0–0.2)

## 2019-12-22 LAB — MAGNESIUM: Magnesium: 1.9 mg/dL (ref 1.7–2.4)

## 2019-12-22 LAB — C-REACTIVE PROTEIN: CRP: 0.8 mg/dL (ref <1.0)

## 2019-12-22 LAB — D-DIMER, QUANTITATIVE: D-Dimer, Quant: <0.27 ug/mL-FEU (ref 0.00–0.50)

## 2019-12-22 LAB — FERRITIN: Ferritin: 634 ng/mL — ABNORMAL HIGH (ref 11–307)

## 2019-12-22 LAB — COMPREHENSIVE METABOLIC PANEL
ALT: 35 U/L (ref 0–44)
AST: 25 U/L (ref 15–41)
Albumin: 2.7 g/dL — ABNORMAL LOW (ref 3.5–5.0)
Alkaline Phosphatase: 34 U/L — ABNORMAL LOW (ref 38–126)
Anion gap: 6 (ref 5–15)
BUN: 20 mg/dL (ref 8–23)
CO2: 22 mmol/L (ref 22–32)
Calcium: 8.8 mg/dL — ABNORMAL LOW (ref 8.9–10.3)
Chloride: 109 mmol/L (ref 98–111)
Creatinine, Ser: 1.13 mg/dL — ABNORMAL HIGH (ref 0.44–1.00)
GFR, Estimated: 51 mL/min — ABNORMAL LOW (ref >60)
Glucose, Bld: 103 mg/dL — ABNORMAL HIGH (ref 70–99)
Potassium: 3.7 mmol/L (ref 3.5–5.1)
Sodium: 137 mmol/L (ref 135–145)
Total Bilirubin: 0.7 mg/dL (ref 0.3–1.2)
Total Protein: 5.5 g/dL — ABNORMAL LOW (ref 6.5–8.1)

## 2019-12-22 LAB — URINE CULTURE

## 2019-12-22 LAB — PHOSPHORUS: Phosphorus: 2.6 mg/dL (ref 2.5–4.6)

## 2019-12-22 LAB — PROTIME-INR
INR: 2.8 — ABNORMAL HIGH (ref 0.8–1.2)
Prothrombin Time: 28.2 seconds — ABNORMAL HIGH (ref 11.4–15.2)

## 2019-12-22 MED ORDER — CARVEDILOL 12.5 MG PO TABS
12.5000 mg | ORAL_TABLET | Freq: Two times a day (BID) | ORAL | Status: DC
  Administered 2019-12-22: 12.5 mg via ORAL

## 2019-12-22 MED ORDER — FUROSEMIDE 20 MG PO TABS: 20.0000 mg | ORAL_TABLET | Freq: Every day | ORAL | 1 refills | Status: DC | PRN

## 2019-12-22 MED ORDER — PANTOPRAZOLE SODIUM 40 MG PO TBEC: 40.0000 mg | DELAYED_RELEASE_TABLET | Freq: Every day | ORAL | 0 refills | Status: DC

## 2019-12-22 MED ORDER — CARVEDILOL 12.5 MG PO TABS
12.5000 mg | ORAL_TABLET | Freq: Two times a day (BID) | ORAL | 0 refills | Status: DC
Start: 2019-12-22 — End: 2019-12-25

## 2019-12-22 NOTE — Discharge Summary (Signed)
Danielle [name], is a 74 y.o. female  DOB 22-Sep-1945  MRN [id number].  Admission date:  12/19/2019  Admitting Physician  Mckinley Jewel, MD  Discharge Date:  12/22/2019   Primary MD  [name], [name], MD  Recommendations for primary care physician for things to follow:  -Please check CBC, CMP, INR during next visit. -Adjust cardiac medications as needed, given soft blood pressure and Entresto, losartan, has been stopped, Lasix is on as needed basis, Coreg dose was lowered to 12.5 mg oral twice daily.   Admission Diagnosis  Acute respiratory failure with hypoxia (HCC) [J96.01] Pneumonia due to COVID-19 virus [U07.1, J12.82] COVID-19 [U07.1]   Discharge Diagnosis  Acute respiratory failure with hypoxia ([place]) [J96.01] Pneumonia due to COVID-19 virus [U07.1, J12.82] COVID-19 [U07.1]    Principal Problem:   Pneumonia due to COVID-19 virus Active Problems:   Essential hypertension   Hyperlipidemia   Paroxysmal atrial fibrillation (HCC)   Coronary artery disease   Thrombocytopenia (HCC)   COPD (chronic obstructive pulmonary disease) ([place])   Aortic valve disease      Past Medical History:  Diagnosis Date  . Allergy   . Anxiety   . Aortic stenosis    Status post bioprosthetic AVR  . Arthritis    DJD  . Asthma   . Breast cancer ([place]) 12/13/2016   Left breast  . Chronic systolic CHF (congestive heart failure) ([place])    EF 30% 04/2014  . COPD (chronic obstructive pulmonary disease) ([place])   . Coronary artery disease    per pt, had LHC prior to AVR in [place] that did not show any blockages; no stents/bypass  . Gastritis   . Hyperlipidemia   . Hypertension   . Hyperthyroidism   . Multiple thyroid nodules   . Osteoporosis   . Paroxysmal atrial fibrillation (HCC)   . Pelvis fracture ([place]) 08/19/2015   MULTIPLE   . Personal history of radiation therapy 2018  . Spontaneous  pneumothorax 11/28/2015   left   . Stroke [facility]) 2000   rt hand weak    Past Surgical History:  Procedure Laterality Date  . BREAST LUMPECTOMY Left 12/2016  . BREAST LUMPECTOMY WITH RADIOACTIVE SEED AND SENTINEL LYMPH NODE BIOPSY Left 01/14/2017   Procedure: LEFT BREAST LUMPECTOMY WITH RADIOACTIVE SEED AND SENTINEL LYMPH NODE BIOPSY;  Surgeon: Rolm Bookbinder, MD;  Location: [place];  Service: General;  Laterality: Left;  . CHEST TUBE INSERTION  10/2015  . INTRAMEDULLARY (IM) NAIL INTERTROCHANTERIC Left 12/10/2015   Procedure: INTRAMEDULLARY (IM) NAIL INTERTROCHANTRIC;  Surgeon: Leandrew Koyanagi, MD;  Location: [place];  Service: Orthopedics;  Laterality: Left;  . PLEURADESIS Left 12/03/2015   Procedure: PLEURADESIS;  Surgeon: Melrose Nakayama, MD;  Location: [place];  Service: Thoracic;  Laterality: Left;  . RESECTION OF APICAL BLEB Left 12/03/2015   Procedure: BLEBECTOMY;  Surgeon: Melrose Nakayama, MD;  Location: [place];  Service: Thoracic;  Laterality: Left;  . THORACOSCOPY  12/03/2015  . VALVE REPLACEMENT    . VIDEO ASSISTED  THORACOSCOPY Left 12/03/2015   Procedure: VIDEO ASSISTED THORACOSCOPY;  Surgeon: Melrose Nakayama, MD;  Location: [place];  Service: Thoracic;  Laterality: Left;       History of present illness and  Hospital Course:     Kindly see H&P for history of present illness and admission details, please review complete Labs, Consult reports and Test reports for all details in brief  HPI  from the history and physical done on the day of admission 12/19/2019  HPI: Danielle [name] is a 74 y.o. female with medical history significant of paroxysmal A. fib-on Coumadin, severe aortic stenosis s/p bioprosthetic AVR, asthma, breast cancer-on tamoxifen, COPD, coronary artery disease, hypertension, hypothyroidism, chronic systolic CHF, hyperlipidemia presents to emergency department for evaluation of generalized weakness and frequent falls since 1 week.  Patient  tells me that she got lightheaded this morning while she was using her walker and fell back onto chair.  Denies loss of consciousness, head trauma, seizures, lightheadedness, dizziness prior to the fall.  Reports, cough, congestion, shortness of breath and generalized weakness since 1 week.  Her sister died recently and she has been depressed about that.  She denies leg swelling, orthopnea, PND, chest pain, abdominal pain, headache, blurry vision, abdominal pain, urinary or bowel changes.  No history of smoking, alcohol, illicit drug use.  She is sad about her sister's death.  Does not want to start on antidepressant at this time.  She denies active suicidal or homicidal thoughts.  She is fully vaccinated against COVID-19.  ED Course: Upon arrival to ED: Patient had fever of 101.2, tachypneic,  COVID-19 positive, CBC shows WBC of 3.6, platelet: 74, lactic acid: WNL, UA, BC, UC: Pending, INR: 2.4, CMP shows AKI on CKD stage III, CT head is negative for acute findings.  Chest x-ray shows postoperative change right upper lobe with subpleural ill-defined opacity in right upper lobe, concerning for potential developing pneumonia in this area.  Patient's oxygen saturation dropped in 90s with walking-placed on 2 L of oxygen via nasal cannula.  Patient was given IV fluid, Decadron and remdesivir in ED.  [facility] hospital[facility] consulted for admission due to COVID-19 pneumonia.  Hospital Course   COVID-19 infection -Patient presented with fever of 101.2, tachypneic, WBC of 3.6, lactic acid: WNL, COVID-19 positive. R -Sepsis ruled out -No hypoxia, no indication for steroids and Remdesivir, has been stopped on admission. -she received  monoclonal antibody 10/21. -She was encouraged to take her incentive spirometry and flutter valve at home and keep using them.  Recurrent falls: -Multifactorial-physical deconditioning,Covid pneumonia, dehydration -CT head is negative for acute findings. -PT/OT consulted, will  arrange at home.  Chronic systolic CHF/nonischemic cardiomyopathy: Patient appears euvolemic on exam. -She is on aspirin, statin,  and Coreg, losartan and Entresto , patient with low blood pressure, Entresto and Lasix has been stopped, despite that she has been holding systolic in the lower to mid 80s yesterday, so I have decreased her Coreg this morning to 12.5 mg p.o. twice daily, she has been tolerating this dose fairly well, where her blood pressure 95/57, she is asymptomatic, no dizziness no lightheadedness, I have discussed discharge medications with cardiology on-call Dr. Rayann Heman, for now we will hold Entresto, Lasix and losartan, she will be discharged on lower dose Coreg, with recommendation with close follow-up with cardiology as an outpatient . -Continue with Lasix on as-needed basis.    Hypertension:  -Blood pressure is low despite holding Entresto, losartan and Lasix, her Coreg has been lowered from 25 mg  p.o. twice daily to 12.5 mg p.o. twice daily .  AKI on CKD stage IIIb: -Avoid nephrotoxic medication.   History of left breast cancer: Diagnosed in 2018 status post breast lumpectomy and adjuvant radiation. -Followed by oncology Dr. Burr Medico -Continue tamoxifen -She is scheduled for mammogram on December 27, 2019.  Leukopenia/thrombocytopenia: Chronic -Continue to monitor.  Hyperlipidemia: Continue statin  Hypophosphatemia -Replete with IV sodium phosphate  Hypothyroidism: Continue methimazole.   Coronary artery disease: Stable. Continue aspirin, statin, Coreg.   History of severe aortic stenosis status post bioprosthetic AVR: Stable -Continue aspirin,   Paroxysmal A. fib: Rate controlled. Reviewed EKG. -CHA2DS2-VASc score: 6 -Coumadin per pharmacy. Continue Coreg.  Morbid obesity with BMI of 39: Encouraged weight loss, dietary modification.  CVA: Continue aspirin, statin.  Situational depression: -Due to recent death in family. -Supportive  care.  Discharge Condition:  stable   Follow UP   Follow-up Information    [name], [name], MD Follow up on 01/01/2020.   Specialty: Family Medicine Why: TELE VISIT  1100 they will  send the link Contact information: [place] [address] [phone]                 Discharge Instructions  and  Discharge Medications     Discharge Instructions    Diet - low sodium heart healthy   Complete by: As directed    Discharge instructions   Complete by: As directed    Follow with Primary MD [name], [name], MD in 10 days   Get CBC, CMP,INR checked  by Primary MD next visit.    Activity: As tolerated with Full fall precautions use walker/cane & assistance as needed   Disposition Home    Diet: Heart Healthy  , with feeding assistance and aspiration precautions.  For Heart failure patients - Check your Weight same time everyday, if you gain over 2 pounds, or you develop in leg swelling, experience more shortness of breath or chest pain, call your Primary MD immediately. Follow Cardiac Low Salt Diet and 1.5 lit/day fluid restriction.   On your next visit with your primary care physician please Get Medicines reviewed and adjusted.   Please request your Prim.MD to go over all Hospital Tests and Procedure/Radiological results at the follow up, please get all Hospital records sent to your Prim MD by signing hospital release before you go home.   If you experience worsening of your admission symptoms, develop shortness of breath, life threatening emergency, suicidal or homicidal thoughts you must seek medical attention immediately by calling 911 or calling your MD immediately  if symptoms less severe.  You Must read complete instructions/literature along with all the possible adverse reactions/side effects for all the Medicines you take and that have been prescribed to you. Take any new Medicines after you have completely understood and accpet all the  possible adverse reactions/side effects.   Do not drive, operating heavy machinery, perform activities at heights, swimming or participation in water activities or provide baby sitting services if your were admitted for syncope or siezures until you have seen by Primary MD or a Neurologist and advised to do so again.  Do not drive when taking Pain medications.    Do not take more than prescribed Pain, Sleep and Anxiety Medications  Special Instructions: If you have smoked or chewed Tobacco  in the last 2 yrs please stop smoking, stop any regular Alcohol  and or any Recreational drug use.  Wear Seat belts while driving.  Please note  You were cared for by a hospitalist during your hospital stay. If you have any questions about your discharge medications or the care you received while you were in the hospital after you are discharged, you can call the unit and asked to speak with the hospitalist on call if the hospitalist that took care of you is not available. Once you are discharged, your primary care physician will handle any further medical issues. Please note that NO REFILLS for any discharge medications will be authorized once you are discharged, as it is imperative that you return to your primary care physician (or establish a relationship with a primary care physician if you do not have one) for your aftercare needs so that they can reassess your need for medications and monitor your lab values.   Increase activity slowly   Complete by: As directed      Allergies as of 12/22/2019      Reactions   Lisinopril Cough   Tetanus Toxoid Adsorbed Swelling   Arm swelling   Other Cough   PEANUTS      Medication List    STOP taking these medications   acetaminophen 500 MG tablet Commonly known as: TYLENOL   Entresto 49-51 MG Generic drug: sacubitril-valsartan   losartan 50 MG tablet Commonly known as: COZAAR     TAKE these medications   albuterol 108 (90 Base) MCG/ACT  inhaler Commonly known as: VENTOLIN HFA Inhale 2 puffs into the lungs every 6 (six) hours as needed for wheezing or shortness of breath.   alendronate 70 MG tablet Commonly known as: FOSAMAX Take 1 tablet (70 mg total) by mouth once a week. Take with a full glass of water on an empty stomach.   aspirin EC 81 MG tablet Take 81 mg daily by mouth.   budesonide-formoterol 160-4.5 MCG/ACT inhaler Commonly known as: Symbicort Inhale 2 puffs into the lungs 2 (two) times daily.   carvedilol 12.5 MG tablet Commonly known as: COREG Take 1 tablet (12.5 mg total) by mouth 2 (two) times daily with a meal. What changed:   medication strength  how much to take  when to take this   diclofenac sodium 1 % Gel Commonly known as: VOLTAREN Apply 4 g topically 4 (four) times daily.   diphenhydramine-acetaminophen 25-500 MG Tabs tablet Commonly known as: TYLENOL PM Take 2 tablets by mouth at bedtime.   furosemide 20 MG tablet Commonly known as: LASIX Take 1 tablet (20 mg total) by mouth daily as needed for fluid or edema. What changed:   when to take this  reasons to take this   MELATONIN PO Take 1 tablet by mouth at bedtime.   methimazole 5 MG tablet Commonly known as: TAPAZOLE TAKE 3 TABLETS BY MOUTH IN THE EVENING . APPOINTMENT REQUIRED FOR FUTURE REFILLS What changed: See the new instructions.   pantoprazole 40 MG tablet Commonly known as: Protonix Take 1 tablet (40 mg total) by mouth daily.   rosuvastatin 40 MG tablet Commonly known as: CRESTOR Take 1 tablet (40 mg total) by mouth daily.   senna-docusate 8.6-50 MG tablet Commonly known as: Senokot-S Take 2 tablets at bedtime by mouth.   tamoxifen 20 MG tablet Commonly known as: NOLVADEX Take 1 tablet by mouth once daily   traMADol 50 MG tablet Commonly known as: ULTRAM Take 1 tablet (50 mg total) by mouth 3 (three) times daily.   trolamine salicylate 10 % cream Commonly known as: ASPERCREME Apply 1 application  topically as  needed for muscle pain.   valACYclovir 500 MG tablet Commonly known as: Valtrex Take 1 tablet (500 mg total) by mouth 2 (two) times daily. For 3 days when outbreaks.   Vitamin D3 10 MCG (400 UNIT) tablet Take 1 tablet (400 Units total) by mouth daily. What changed: how much to take   warfarin 1 MG tablet Commonly known as: COUMADIN Take as directed. If you are unsure how to take this medication, talk to your nurse or doctor. Original instructions: As directed by anticoagulation clinic What changed: Another medication with the same name was changed. Make sure you understand how and when to take each.   warfarin 3 MG tablet Commonly known as: COUMADIN Take as directed. If you are unsure how to take this medication, talk to your nurse or doctor. Original instructions: TAKE AS DIRECTED BY ANTICOAGULATION CLINIC What changed: See the new instructions.         Diet and Activity recommendation: See Discharge Instructions above   Consults obtained - none   Major procedures and Radiology Reports - PLEASE review detailed and final reports for all details, in brief -      CT Head Wo Contrast  Result Date: 12/19/2019 CLINICAL DATA:  Weakness, head injury after multiple falls. EXAM: CT HEAD WITHOUT CONTRAST TECHNIQUE: Contiguous axial images were obtained from the base of the skull through the vertex without intravenous contrast. COMPARISON:  None. FINDINGS: Brain: Mild chronic ischemic white matter disease is noted. 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 No mass effect or midline shift is noted. Ventricular size is within normal limits. There is no evidence of mass lesion, hemorrhage or acute infarction. Vascular: No hyperdense vessel or unexpected calcification. Skull: Normal. Negative for fracture or focal lesion. Sinuses/Orbits: No acute finding. Other: None. IMPRESSION: Mild chronic ischemic white matter disease. 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 No acute intracranial abnormality seen. Electronically Signed   By: Marijo Conception M.D.   On: 12/19/2019 12:43   DG Chest Port 1 View  Result Date: 12/21/2019 CLINICAL DATA:  Cough.  COVID-19 EXAM: PORTABLE CHEST 1 VIEW COMPARISON:  Two days ago FINDINGS: Continued low volume chest with mild interstitial predominant opacity on both sides. No visible effusion or recurrent pneumothorax. Postoperative right apex. Partially obscured heart size without interval change. Aortic tortuosity. IMPRESSION: Patchy interstitial opacity from chronic lung disease or atypical pneumonia. No change from 2 days ago. Electronically Signed   By: Monte Fantasia M.D.   On: 12/21/2019 10:00   DG Chest Port 1 View  Result Date: 12/19/2019 CLINICAL DATA:  Cough and weakness EXAM: PORTABLE CHEST 1 VIEW COMPARISON:  December 23, 2015. FINDINGS: There is postoperative change in the right upper lobe with ill-defined airspace opacity in the right upper lobe. There is bibasilar atelectasis. Heart is mildly enlarged with pulmonary vascularity normal. Patient is status post median sternotomy. There is aortic atherosclerosis. Surgical clips noted in the lateral left breast region. IMPRESSION: Postoperative change right upper lobe with subtle ill-defined opacity in the right upper lobe, concerning for potential developing pneumonia in this area. Mild bibasilar atelectasis. Lungs elsewhere clear. The changes in the right upper lobe could be indicative of atypical organism pneumonia. Check of COVID-19 status in this regard advised. No consolidation evident. Heart is mildly enlarged with postoperative change. Aortic Atherosclerosis (ICD10-I70.0). Electronically Signed   By: Lowella Grip III M.D.   On: 12/19/2019 11:05    Micro Results     Recent Results (from the  past 240 hour(s))  Blood culture (routine single)     Status: None (Preliminary result)   Collection Time: 12/19/19 11:40 AM   Specimen: BLOOD  RIGHT HAND  Result Value Ref Range Status   Specimen Description BLOOD RIGHT HAND  Final   Special Requests   Final    BOTTLES DRAWN AEROBIC AND ANAEROBIC Blood Culture results may not be optimal due to an inadequate volume of blood received in culture bottles   Culture   Final    NO GROWTH 3 DAYS Performed at [facility], [place] [address], [place], [place] [address]    Report Status PENDING  Incomplete  Respiratory Panel by RT PCR (Flu A&B, Covid) - Nasopharyngeal Swab     Status: Abnormal   Collection Time: 12/19/19 12:04 PM   Specimen: Nasopharyngeal Swab  Result Value Ref Range Status   SARS Coronavirus 2 by RT PCR POSITIVE (A) NEGATIVE Final    Comment: emailed L. Berdik RN 13:25 12/19/19 ([email or web address]) (NOTE) SARS-CoV-2 target nucleic acids are DETECTED.  SARS-CoV-2 RNA is generally detectable in upper respiratory specimens  during the acute phase of infection. Positive results are indicative of the presence of the identified virus, but do not rule out bacterial infection or co-infection with other pathogens not detected by the test. Clinical correlation with patient history and other diagnostic information is necessary to determine patient infection status. The expected result is Negative.  Fact Sheet for Patients:  PinkCheek.be  Fact Sheet for Healthcare Providers: GravelBags.it  This test is not yet approved or cleared by the [place] FDA and  has been authorized for detection and/or diagnosis of SARS-CoV-2 by FDA under an Emergency Use Authorization (EUA).  This EUA will remain in effect (meaning this test can be used) for the duration of  the COVID-19  declaration under Section 564(b)(1) of the Act, 21 U.S.C. section 360bbb-3(b)(1), unless the authorization is terminated or revoked sooner.      Influenza A by PCR NEGATIVE NEGATIVE Final   Influenza B by PCR NEGATIVE NEGATIVE Final    Comment:  (NOTE) The Xpert Xpress SARS-CoV-2/FLU/RSV assay is intended as an aid in  the diagnosis of influenza from Nasopharyngeal swab specimens and  should not be used as a sole basis for treatment. Nasal washings and  aspirates are unacceptable for Xpert Xpress SARS-CoV-2/FLU/RSV  testing.  Fact Sheet for Patients: PinkCheek.be  Fact Sheet for Healthcare Providers: GravelBags.it  This test is not yet approved or cleared by the [place] FDA and  has been authorized for detection and/or diagnosis of SARS-CoV-2 by  FDA under an Emergency Use Authorization (EUA). This EUA will remain  in effect (meaning this test can be used) for the duration of the  Covid-19 declaration under Section 564(b)(1) of the Act, 21  U.S.C. section 360bbb-3(b)(1), unless the authorization is  terminated or revoked. Performed at [facility], [place] [address] [place], [place] [address]   Urine culture     Status: Abnormal   Collection Time: 12/21/19 12:40 AM   Specimen: In/Out Cath Urine  Result Value Ref Range Status   Specimen Description IN/OUT CATH URINE  Final   Special Requests   Final    NONE Performed at [facility], [place] [address], [place],  [address]    Culture MULTIPLE SPECIES PRESENT, SUGGEST RECOLLECTION (A)  Final   Report Status 12/22/2019 FINAL  Final       Today   Subjective:   Danielle [name] today  has no headache,no chest abdominal pain, he was ambulated in the hallway today, no hypoxia, no dyspnea, no lightheadedness or dizziness, feels much better wants to go home today.   Objective:   Blood pressure 91/60, pulse 98, temperature 98.6 F (37 C), temperature source Oral, resp. rate 20, height 5\' 7"  (1.702 m), weight 113.6 kg, SpO2 93 %.   Intake/Output Summary (Last 24 hours) at 12/22/2019 1234 Last data filed at 12/22/2019 0930 Gross per 24 hour  Intake 632.62 ml  Output --  Net  632.62 ml    Exam Awake Alert, Oriented x 3, No new F.N deficits, Normal affect Symmetrical Chest wall movement, Good air movement bilaterally, CTAB RRR,No Gallops,Rubs or new Murmurs, No Parasternal Heave +ve B.Sounds, Abd Soft, Non tender,No rebound -guarding or rigidity. No Cyanosis, Clubbing or edema, No new Rash or bruise  Data Review   CBC w Diff:  Lab Results  Component Value Date   WBC 4.7 12/22/2019   HGB 11.5 (L) 12/22/2019   HGB 12.1 11/22/2019   HGB 13.2 12/22/2016   HCT 35.2 (L) 12/22/2019   HCT 40.0 12/22/2016   PLT 83 (L) 12/22/2019   PLT 92 (L) 11/22/2019   PLT 103 (L) 12/22/2016   PLT 136 (L) 04/08/2016   LYMPHOPCT 27 12/22/2019   LYMPHOPCT 20.8 12/22/2016   MONOPCT 8 12/22/2019   MONOPCT 6.7 12/22/2016   EOSPCT 1 12/22/2019   EOSPCT 4.3 12/22/2016   BASOPCT 0 12/22/2019   BASOPCT 0.4 12/22/2016    CMP:  Lab Results  Component Value Date   NA 137 12/22/2019   NA 142 06/28/2019   NA 143 12/22/2016   K 3.7 12/22/2019   K 4.3 12/22/2016   CL 109 12/22/2019   CO2 22 12/22/2019   CO2 25 12/22/2016   BUN 20 12/22/2019   BUN 14 06/28/2019   BUN 19.5 12/22/2016   CREATININE 1.13 (H) 12/22/2019   CREATININE 1.16 (H) 11/22/2019   CREATININE 1.2 (H) 12/22/2016   PROT 5.5 (L) 12/22/2019   PROT 7.0 12/22/2016   ALBUMIN 2.7 (L) 12/22/2019   ALBUMIN 3.8 12/22/2016   BILITOT 0.7 12/22/2019   BILITOT 0.4 11/22/2019   BILITOT 0.70 12/22/2016   ALKPHOS 34 (L) 12/22/2019   ALKPHOS 74 12/22/2016   AST 25 12/22/2019   AST 12 (L) 11/22/2019   AST 13 12/22/2016   ALT 35 12/22/2019   ALT 9 11/22/2019   ALT 9 12/22/2016  .   Total Time in preparing paper work, data evaluation and todays exam - 49 minutes  Phillips Climes M.D on 12/22/2019 at 12:34 PM  [facility]   Office  [phone]

## 2019-12-22 NOTE — Plan of Care (Signed)
Patient states she feels much better.  Discussed in detail discharge instruction.  I was able to confirm her understanding via "teach back method".

## 2019-12-22 NOTE — Progress Notes (Signed)
ANTICOAGULATION CONSULT NOTE - Follow Up Consult  Pharmacy Consult for Warfarin Indication: atrial fibrillation  Allergies  Allergen Reactions  . Lisinopril Cough  . Tetanus Toxoid Adsorbed Swelling    Arm swelling  . Other Cough    PEANUTS    Patient Measurements: Height: 5\' 7"  (170.2 cm) Weight: 113.6 kg (250 lb 7.1 oz) IBW/kg (Calculated) : 61.6  Vital Signs: Temp: 98.6 F (37 C) (10/23 0736) Temp Source: Oral (10/23 0736) BP: 87/51 (10/23 0736) Pulse Rate: 98 (10/23 0736)  Labs: Recent Labs    12/19/19 1153 12/19/19 1153 12/19/19 1545 12/19/19 1815 12/20/19 0642 12/20/19 0642 12/21/19 0246 12/22/19 0330  HGB 12.2   < >  --   --  11.0*   < > 10.7* 11.5*  HCT 39.1   < >  --   --  34.4*  --  32.9* 35.2*  PLT 74*   < >  --   --  66*  --  74* 83*  APTT 51*  --   --   --   --   --   --   --   LABPROT 25.0*   < >  --   --  25.4*  --  28.0* 28.2*  INR 2.4*   < >  --   --  2.4*  --  2.7* 2.8*  CREATININE 1.47*   < >  --   --  1.31*  --  1.19* 1.13*  TROPONINIHS  --   --  14 15  --   --   --   --    < > = values in this interval not displayed.    Estimated Creatinine Clearance: 57.7 mL/min (A) (by C-G formula based on SCr of 1.13 mg/dL (H)).  Assessment:  74 yr old female on warfarin prior to admission for atrial fibrillation.  Recurrent falls noted. Chronic thrombocytopenia.  INR remains therapeutic (2.8) on home warfarin regimen of 3 mg daily, though 10/21 dose was missed (RN contacted by pharmacists). No bleeding reported. INR is trending up towards high end of goal, but monitor for impact from missed dose.   Goal of Therapy:  INR 2-3 Monitor platelets by anticoagulation protocol: Yes   Plan:  Continue Warfarin 3 mg daily. Daily PT/INR for now. Follow CBC. Monitor for any s/sx bleeding.  Cephus Slater, PharmD, MBA Pharmacy Resident [phone] 12/22/2019 7:58 AM

## 2019-12-24 ENCOUNTER — Telehealth: Payer: Medicare Other

## 2019-12-24 ENCOUNTER — Telehealth: Payer: Self-pay

## 2019-12-24 ENCOUNTER — Telehealth: Payer: Self-pay | Admitting: Family Medicine

## 2019-12-24 DIAGNOSIS — I639 Cerebral infarction, unspecified: Secondary | ICD-10-CM

## 2019-12-24 DIAGNOSIS — N183 Chronic kidney disease, stage 3 unspecified: Secondary | ICD-10-CM

## 2019-12-24 DIAGNOSIS — J449 Chronic obstructive pulmonary disease, unspecified: Secondary | ICD-10-CM

## 2019-12-24 DIAGNOSIS — I5022 Chronic systolic (congestive) heart failure: Secondary | ICD-10-CM

## 2019-12-24 LAB — CULTURE, BLOOD (SINGLE): Culture: NO GROWTH

## 2019-12-24 NOTE — Telephone Encounter (Cosign Needed)
Transition Care Management Follow-up Telephone Call  Date of discharge and from where: 12/22/19  How have you been since you were released from the hospital? Very tired and weak  Any questions or concerns? Yes left message for Dr [name]  Items Reviewed:  Did the pt receive and understand the discharge instructions provided? Yes   Medications obtained and verified? Yes   Other? No   Any new allergies since your discharge? No   Dietary orders reviewed? Yes Do you have support at home? Yes   Husband [name] but states they need Help Home Care and Equipment/Supplies: Were home health services ordered? no If so, what is the name of the agency? N/A  Has the agency set up a time to come to the patient's home? no Were any new equipment or medical supplies ordered?  No What is the name of the medical supply agency? N/a Were you able to get the supplies/equipment? not applicable Do you have any questions related to the use of the equipment or supplies? No  Functional Questionnaire: (I = Independent and D = Dependent) ADLs: D  Bathing/Dressing- D  Meal Prep- D  Eating- D  Maintaining continence- D  Transferring/Ambulation- D  Managing Meds- D  Follow up appointments reviewed:   PCP Hospital f/u appt confirmed? No  Pt states she will call tomorrow for FU appt   Specialist Hospital f/u appt confirmed? No    Are transportation arrangements needed? No   If their condition worsens, is the pt aware to call PCP or go to the Emergency Dept.? Yes  Was the patient provided with contact information for the PCP's office or ED? Yes  Was to pt encouraged to call back with questions or concerns? Yes

## 2019-12-24 NOTE — Telephone Encounter (Signed)
Referral has been placed.   I informed pt and told her to let me know if she doesn't hear anything from them by tomorrow.

## 2019-12-24 NOTE — Telephone Encounter (Signed)
Patient states she needs to start home health that she just can't do it anymore.  She wants you to call [facility].

## 2019-12-25 ENCOUNTER — Other Ambulatory Visit: Payer: Self-pay

## 2019-12-25 MED ORDER — CARVEDILOL 12.5 MG PO TABS
12.5000 mg | ORAL_TABLET | Freq: Two times a day (BID) | ORAL | 6 refills | Status: DC
Start: 2019-12-25 — End: 2020-04-30

## 2019-12-25 NOTE — Telephone Encounter (Signed)
Home health referral had been placed yesterday & pt was notified. New Rx sent in for her Coreg with the new directions.

## 2019-12-25 NOTE — Telephone Encounter (Signed)
Medicare does not cover assistance at home, unless we need another service like PT or wound care. Home PT can be arranged, if not done yet. Thanks, BJ

## 2019-12-26 NOTE — Telephone Encounter (Signed)
I spoke with pt. She is aware that Nanine Means will be reaching out to her.

## 2019-12-26 NOTE — Addendum Note (Signed)
Addended by: Rodrigo Ran on: 12/26/2019 04:31 PM   Modules accepted: Orders

## 2019-12-26 NOTE — Telephone Encounter (Signed)
I left pt a voicemail letting her know the following:  [facility] does not take her insurance, so I have sent messages to [place] to see who can take over her home health needs. I let her know that I would notify her once an agency has been approved.

## 2019-12-27 ENCOUNTER — Other Ambulatory Visit: Payer: Self-pay | Admitting: Family Medicine

## 2019-12-27 DIAGNOSIS — E0590 Thyrotoxicosis, unspecified without thyrotoxic crisis or storm: Secondary | ICD-10-CM

## 2019-12-28 ENCOUNTER — Other Ambulatory Visit: Payer: Self-pay | Admitting: *Deleted

## 2019-12-28 NOTE — Patient Outreach (Addendum)
[facility]) Care Management  12/28/2019  Danielle [name] January 10, 1946 [id number]   Telephone Assessment-Enrollment-Heart Failure  RN spoke with pt today concerning [facility] services her recent discharge. Pt states [facility] have declined her and she is in need of [facility] for PT and aide services. States she has a supportive spouse but needs ongoing therapy. RN offered to engage with other [place] agencies for services as pt agreed. Will call [facility] and [facility] for HHPT in an attempt to arrange HHPT as indicated upon pt's recent discharge from the hospital.   Call [facility] spoke with Judson Roch concerning referral for [facility]. Will review pt's chart and follow up with pt directly on Monday for services. Agency will also follow up with this RN case manager if pt's care cn be accommodated. Pt has been updated and very appreciated. Also updated that agency is unable to provider the necessary aide services at this time.   Other discussions related to pt's ongoing medical issues as followed:  COPD-discussed all inhalers, use of the Incentive Spiromerty and educated on the COPD action zones. Pt is aware of what to do if acute symptoms soul occur and reports her COPD is currently under control with no flare ups.  HTN- Pt states she occasionally monitoring her blood pressures but this is a condition that is also under control with her ongoing medications.  Atrial Fibrilltion- controlled with no encountered symptoms. Heart Failure-Discussed knowledge base on her HF along with education on the HF zones. Verified pt is currently in the GREEN zone with no percipitating symptoms and confirmed pt weighs daily in monitoring this condition. Offered enrollment into the program for ongoing increase in her knowledge base as pt receptive to the plan. Note care plan generated for Heart Failure.   Reviewed all medications related to each medical condition and verified pt is aware of each of her  medications with known side effects. Pt remains adherent to all her medications and has a sufficient supply on her refills. Based upon the above offered to assist with enrollment for Heart Failure for ongoing case management services. Pt receptive as Therapist, sports generated a plan of care and discussed all goals and interventions for pt to meet these goals. All barriers recognized with resolutions to most and how pt should response to meeting her goals with the support of her family and spouse in the home. Pt has sufficient transportation and aware of her upcoming appointments. Pt will weight daily and note any swelling to her provider above the parameters discussed today 3 lbs overnight and/or 5 lbs over one week with any swelling to her extremities. No other issues to address at this time as RN will send information packet and letters verified pt's disposition to her provider with [facility] services.  Will follow up in 2 weeks piror to monthly calls for ongoing [facility] services with the current plan of care in place. No additional needs presented at this time.    Goals Addressed            This Visit's Progress   . THN-Make and Keep All Appointments       Follow Up Date 01/29/2020   - ask family or friend for a ride - call to cancel if needed - keep a calendar with prescription refill dates - keep a calendar with appointment dates    Why is this important?   Part of staying healthy is seeing the doctor for follow-up care.  If you forget your appointments, there are some things you  can do to stay on track.    Notes:     . THN-Track and Manage Symptoms       Follow Up Date 02/29/2020    - begin a heart failure diary - develop a rescue plan - eat more whole grains, fruits and vegetables, lean meats and healthy fats - follow rescue plan if symptoms flare-up - know when to call the doctor - track symptoms and what helps feel better or worse    Why is this important?   You will be able to handle your  symptoms better if you keep track of them.  Making some simple changes to your lifestyle will help.  Eating healthy is one thing you can do to take good care of yourself.    Notes:        Raina Mina, RN Care Management Coordinator [place] Office [phone]

## 2019-12-31 ENCOUNTER — Encounter: Payer: Self-pay | Admitting: Family Medicine

## 2019-12-31 ENCOUNTER — Ambulatory Visit (INDEPENDENT_AMBULATORY_CARE_PROVIDER_SITE_OTHER): Payer: Medicare Other | Admitting: Family Medicine

## 2019-12-31 ENCOUNTER — Ambulatory Visit (INDEPENDENT_AMBULATORY_CARE_PROVIDER_SITE_OTHER): Payer: Medicare Other | Admitting: General Practice

## 2019-12-31 ENCOUNTER — Other Ambulatory Visit: Payer: Self-pay

## 2019-12-31 VITALS — BP 132/78 | HR 84 | Temp 98.6°F | Resp 16 | Ht 67.0 in | Wt 254.2 lb

## 2019-12-31 DIAGNOSIS — J449 Chronic obstructive pulmonary disease, unspecified: Secondary | ICD-10-CM | POA: Diagnosis not present

## 2019-12-31 DIAGNOSIS — D696 Thrombocytopenia, unspecified: Secondary | ICD-10-CM

## 2019-12-31 DIAGNOSIS — N183 Chronic kidney disease, stage 3 unspecified: Secondary | ICD-10-CM

## 2019-12-31 DIAGNOSIS — U071 COVID-19: Secondary | ICD-10-CM | POA: Diagnosis not present

## 2019-12-31 DIAGNOSIS — I10 Essential (primary) hypertension: Secondary | ICD-10-CM

## 2019-12-31 DIAGNOSIS — Z7901 Long term (current) use of anticoagulants: Secondary | ICD-10-CM | POA: Diagnosis not present

## 2019-12-31 DIAGNOSIS — J1282 Pneumonia due to coronavirus disease 2019: Secondary | ICD-10-CM

## 2019-12-31 DIAGNOSIS — L292 Pruritus vulvae: Secondary | ICD-10-CM

## 2019-12-31 DIAGNOSIS — I700 Atherosclerosis of aorta: Secondary | ICD-10-CM

## 2019-12-31 LAB — POCT INR: INR: 5.2 — AB (ref 2.0–3.0)

## 2019-12-31 MED ORDER — FLUCONAZOLE 150 MG PO TABS: 150.0000 mg | ORAL_TABLET | Freq: Once | ORAL | 0 refills | Status: AC

## 2019-12-31 NOTE — Patient Instructions (Addendum)
A few things to remember from today's visit:   Stage 3 chronic kidney disease, unspecified whether stage 3a or 3b CKD ([place]) - Plan: COMPLETE METABOLIC PANEL WITH GFR  Thrombocytopenia ([place]) - Plan: CBC  Essential hypertension  No changes today.  If you need refills please call your pharmacy. Do not use My Chart to request refills or for acute issues that need immediate attention.    Please be sure medication list is accurate. If a new problem present, please set up appointment sooner than planned today.

## 2019-12-31 NOTE — Patient Instructions (Addendum)
Pre visit review using our clinic review tool, if applicable. No additional management support is needed unless otherwise documented below in the visit note.  Hold dosage today, tomorrow and Wednesday.  On Thursday continue to take 1 (3 mg) tablet daily.  Re-check in 2 weeks.

## 2019-12-31 NOTE — Progress Notes (Signed)
HPI: [name] is a 74 y.o. female with hx of COPD,AS s/p bioprosthetic aortic valve replacement ,HFrEF,atrial fib,and breast cancer here today with her husband to follow on recent hospitalization.  Admitted on 12/19/19 and discharged on 12/22/19. Transition of care management phone call f/u on 12/25/19.  She presented to the ER c/o weakness and frequent falls as well as cough,congestion,and SOB. She was febrile at 101.0 F and tachypnea  Because hypotension HTN treatment was adjusted. Entresto and Losartan were discontinued and Coreg dose decreased to 12.5 mg bid. Lasix changed to prn. She has no orthopnea or PND.  She is having non productive cough. Post nasal drainage and nasal congestion.  CXR 12/19/19. Postoperative change right upper lobe with subtle ill-defined opacity in the right upper lobe, concerning for potential developing pneumonia in this area. Mild bibasilar atelectasis. Lungs elsewhere clear. The changes in the right upper lobe could be indicative of atypical organism pneumonia. Check of COVID-19 status in this regard advised. No consolidation evident. Heart is mildly enlarged with postoperative change. Aortic Atherosclerosis (ICD10-I70.0).  CXR 12/21/19:Patchy interstitial opacity from chronic lung disease or atypical pneumonia. No change from 2 days ago.  In general she is feeling better. Still having  SOB intermittently and non productive cough. It is exacerbated by exertion. She is not on supplemental O2.  She is doing incentive spirometry. Problem stable since hospital discharge.  PT 2-3 times per week. She uses Albuterol in every morning, because it was given like this in the hospital. She is also on Dulera 200-5 mcg bid.  + Fatigue.  She is checking 130's/70's. Negative for CP,palpitations,or worsening edema. Atrial fib on Coumadin. She has an appt today with coumadin clinic. INR 2.7 on 12/21/19.  Thrombocytopenia: She has  not noted more bruising than usual,blood in stool,or melena. CKD III: She has not noted foam in urine or decreased urine output.  Lab Results  Component Value Date   CREATININE 1.13 (H) 12/22/2019   BUN 20 12/22/2019   NA 137 12/22/2019   K 3.7 12/22/2019   CL 109 12/22/2019   CO2 22 12/22/2019   Lab Results  Component Value Date   WBC 4.7 12/22/2019   HGB 11.5 (L) 12/22/2019   HCT 35.2 (L) 12/22/2019   MCV 94.1 12/22/2019   PLT 83 (L) 12/22/2019   She is having vulval pruritus, no vaginal discharge. During hospitalization she had urine cath, pureWick system. She has not noted dysuria,gross hematuria,or pelvic pain. She has not tried OTC medication.  Review of Systems  Constitutional: Positive for activity change. Negative for appetite change and fever.  HENT: Negative for mouth sores, nosebleeds and sore throat.   Eyes: Negative for redness and visual disturbance.  Gastrointestinal: Negative for abdominal pain, nausea and vomiting.       Negative for changes in bowel habits.  Musculoskeletal: Positive for back pain and gait problem.  Allergic/Immunologic: Positive for environmental allergies.  Neurological: Negative for syncope and headaches.  Psychiatric/Behavioral: Negative for confusion. The patient is nervous/anxious.   Rest see pertinent positives and negatives per HPI.  Current Outpatient Medications on File Prior to Visit  Medication Sig Dispense Refill  . albuterol (PROVENTIL HFA;VENTOLIN HFA) 108 (90 Base) MCG/ACT inhaler Inhale 2 puffs into the lungs every 6 (six) hours as needed for wheezing or shortness of breath.     Marland Kitchen alendronate (FOSAMAX) 70 MG tablet Take 1 tablet (70 mg total) by mouth once a week. Take with a full glass of water on an  empty stomach. 13 tablet 3  . aspirin EC 81 MG tablet Take 81 mg daily by mouth.    . carvedilol (COREG) 12.5 MG tablet Take 1 tablet (12.5 mg total) by mouth 2 (two) times daily with a meal. 60 tablet 6  . Cholecalciferol  (VITAMIN D3) 400 units tablet Take 1 tablet (400 Units total) by mouth daily. (Patient taking differently: Take 1,000 Units by mouth daily. ) 30 tablet 0  . diclofenac sodium (VOLTAREN) 1 % GEL Apply 4 g topically 4 (four) times daily. 500 g 3  . diphenhydramine-acetaminophen (TYLENOL PM) 25-500 MG TABS tablet Take 2 tablets by mouth at bedtime.     . furosemide (LASIX) 20 MG tablet Take 1 tablet (20 mg total) by mouth daily as needed for fluid or edema. 30 tablet 1  . MELATONIN PO Take 1 tablet by mouth at bedtime.    . methimazole (TAPAZOLE) 5 MG tablet TAKE 3 TABLETS BY MOUTH IN THE EVENING . APPOINTMENT REQUIRED FOR FUTURE REFILLS 180 tablet 0  . mometasone-formoterol (DULERA) 200-5 MCG/ACT AERO Inhale 2 puffs into the lungs 2 (two) times daily.    . pantoprazole (PROTONIX) 40 MG tablet Take 1 tablet (40 mg total) by mouth daily. 30 tablet 0  . rosuvastatin (CRESTOR) 40 MG tablet Take 1 tablet (40 mg total) by mouth daily. 90 tablet 1  . senna-docusate (SENOKOT-S) 8.6-50 MG tablet Take 2 tablets at bedtime by mouth.     . tamoxifen (NOLVADEX) 20 MG tablet Take 1 tablet by mouth once daily 90 tablet 2  . traMADol (ULTRAM) 50 MG tablet Take 1 tablet (50 mg total) by mouth 3 (three) times daily. 90 tablet 5  . trolamine salicylate (ASPERCREME) 10 % cream Apply 1 application topically as needed for muscle pain.    . valACYclovir (VALTREX) 500 MG tablet Take 1 tablet (500 mg total) by mouth 2 (two) times daily. For 3 days when outbreaks. 24 tablet 1  . warfarin (COUMADIN) 1 MG tablet As directed by anticoagulation clinic 12 tablet 2  . warfarin (COUMADIN) 3 MG tablet TAKE AS DIRECTED BY ANTICOAGULATION CLINIC (Patient taking differently: Take 3 mg by mouth daily at 4 PM. TAKE AS DIRECTED BY  ANTICOAGULATION  CLINIC) 90 tablet 2   No current facility-administered medications on file prior to visit.     Past Medical History:  Diagnosis Date  . Allergy   . Anxiety   . Aortic stenosis    Status  post bioprosthetic AVR  . Arthritis    DJD  . Asthma   . Breast cancer ([place]) 12/13/2016   Left breast  . Chronic systolic CHF (congestive heart failure) ([place])    EF 30% 04/2014  . COPD (chronic obstructive pulmonary disease) ([place])   . Coronary artery disease    per pt, had LHC prior to AVR in [place] that did not show any blockages; no stents/bypass  . Gastritis   . Hyperlipidemia   . Hypertension   . Hyperthyroidism   . Multiple thyroid nodules   . Osteoporosis   . Paroxysmal atrial fibrillation (HCC)   . Pelvis fracture ([place]) 08/19/2015   MULTIPLE   . Personal history of radiation therapy 2018  . Spontaneous pneumothorax 11/28/2015   left   . Stroke [facility]) 2000   rt hand weak   Allergies  Allergen Reactions  . Lisinopril Cough  . Tetanus Toxoid Adsorbed Swelling    Arm swelling  . Other Cough    PEANUTS  Social History   Socioeconomic History  . Marital status: Married    Spouse name: Sherwood  . Number of children: 0  . Years of education: Not on file  . Highest education level: Not on file  Occupational History  . Occupation: Retired in 2004  Tobacco Use  . Smoking status: Former Smoker    Packs/day: 0.25    Years: 10.00    Pack years: 2.50    Types: Cigarettes    Quit date: 03/01/2010    Years since quitting: 9.8  . Smokeless tobacco: Never Used  Vaping Use  . Vaping Use: Never used  Substance and Sexual Activity  . Alcohol use: No  . Drug use: No  . Sexual activity: Not Currently  Other Topics Concern  . Not on file  Social History Narrative   Lives with husband.  Ambulated independently.   Social Determinants of Health   Financial Resource Strain: Medium Risk  . Difficulty of Paying Living Expenses: Somewhat hard  Food Insecurity: No Food Insecurity  . Worried About Charity fundraiser in the Last Year: Never true  . Ran Out of Food in the Last Year: Never true  Transportation Needs: No Transportation Needs  . Lack of Transportation  (Medical): No  . Lack of Transportation (Non-Medical): No  Physical Activity: Sufficiently Active  . Days of Exercise per Week: 7 days  . Minutes of Exercise per Session: 30 min  Stress: Stress Concern Present  . Feeling of Stress : To some extent  Social Connections: Moderately Integrated  . Frequency of Communication with Friends and Family: More than three times a week  . Frequency of Social Gatherings with Friends and Family: Once a week  . Attends Religious Services: More than 4 times per year  . Active Member of Clubs or Organizations: No  . Attends Archivist Meetings: Never  . Marital Status: Married   Vitals:   12/31/19 1042  BP: 132/78  Pulse: 84  Resp: 16  Temp: 98.6 F (37 C)  SpO2: 98%   Body mass index is 39.82 kg/m.  Physical Exam Vitals and nursing note reviewed.  Constitutional:      General: She is not in acute distress.    Appearance: She is well-developed.  HENT:     Head: Normocephalic and atraumatic.     Mouth/Throat:     Mouth: Mucous membranes are moist.     Dentition: Has dentures.     Pharynx: Oropharynx is clear.  Eyes:     Conjunctiva/sclera: Conjunctivae normal.     Pupils: Pupils are equal, round, and reactive to light.  Cardiovascular:     Rate and Rhythm: Normal rate and regular rhythm.     Heart sounds: Murmur (SEM II/VI RUSB) heard.      Comments: DP pulses present. Pulmonary:     Effort: Pulmonary effort is normal. No respiratory distress.     Breath sounds: Normal breath sounds.  Abdominal:     Palpations: Abdomen is soft. There is no mass.     Tenderness: There is no abdominal tenderness.  Musculoskeletal:     Right lower leg: 1+ Pitting Edema present.     Left lower leg: 1+ Pitting Edema present.  Lymphadenopathy:     Cervical: No cervical adenopathy.  Skin:    General: Skin is warm.     Findings: No erythema or rash.  Neurological:     Mental Status: She is alert and oriented to person, place, and time.  Cranial Nerves: No cranial nerve deficit.     Comments: She is in a wheel chair.  Psychiatric:     Comments: Well groomed, good eye contact.   ASSESSMENT AND PLAN:  Ms.Johnita was seen today for hospitalization follow-up.  Diagnoses and all orders for this visit: Orders Placed This Encounter  Procedures  . COMPLETE METABOLIC PANEL WITH GFR  . CBC   Lab Results  Component Value Date   WBC 6.9 12/31/2019   HGB 11.1 (L) 12/31/2019   HCT 34.9 (L) 12/31/2019   MCV 96.4 12/31/2019   PLT 167 12/31/2019   Lab Results  Component Value Date   ALT 22 12/31/2019   AST 18 12/31/2019   ALKPHOS 34 (L) 12/22/2019   BILITOT 0.5 12/31/2019   Lab Results  Component Value Date   CREATININE 1.09 (H) 12/31/2019   BUN 10 12/31/2019   NA 139 12/31/2019   K 4.6 12/31/2019   CL 110 12/31/2019   CO2 18 (L) 12/31/2019   Pneumonia due to COVID-19 virus Gradually getting better, she is non on respiratory distress. Will plan on repeating CXR in 4 weeks.  Stage 3 chronic kidney disease, unspecified whether stage 3a or 3b CKD ([place]) AKI improved during hospitalization. Cr 1.13 and e GFR 51 (48). Continue low salt diet and adequate hydration. For now we will not resume Losartan.  Thrombocytopenia ([place]) Slowly improving. Possible causes discussed. Instructed about warning signs.  Essential hypertension BP adequately controlled. Continue Carvedilol 12.5 mg bid. Continue monitoring BP regularly.  Chronic obstructive pulmonary disease, unspecified COPD type (HCC) Continue Dulera 200-5 bid and Albuterol inh 2 puff q 4-6 hours as needed.  Aortic atherosclerosis (HCC) Continue Aspirin 81 mg daily and Crestor 40 mg daily.  Vulvar pruritus Possible causes discussed. It could be irritation,contact dermatitis. Will treat empirically as fungal etiologist.  -     fluconazole (DIFLUCAN) 150 MG tablet; Take 1 tablet (150 mg total) by mouth once for 1 dose.   Return in about 4 weeks (around  01/28/2020) for HTN.   Ruairi Stutsman G. [name], MD  [facility]. [place] office.   A few things to remember from today's visit:   Stage 3 chronic kidney disease, unspecified whether stage 3a or 3b CKD ([place]) - Plan: COMPLETE METABOLIC PANEL WITH GFR  Thrombocytopenia ([place]) - Plan: CBC  Essential hypertension  No changes today.  If you need refills please call your pharmacy. Do not use My Chart to request refills or for acute issues that need immediate attention.    Please be sure medication list is accurate. If a new problem present, please set up appointment sooner than planned today.

## 2020-01-01 ENCOUNTER — Telehealth: Payer: Medicare Other | Admitting: Family Medicine

## 2020-01-01 LAB — COMPLETE METABOLIC PANEL WITH GFR
AG Ratio: 1.1 (calc) (ref 1.0–2.5)
ALT: 22 U/L (ref 6–29)
AST: 18 U/L (ref 10–35)
Albumin: 3.4 g/dL — ABNORMAL LOW (ref 3.6–5.1)
Alkaline phosphatase (APISO): 53 U/L (ref 37–153)
BUN/Creatinine Ratio: 9 (calc) (ref 6–22)
BUN: 10 mg/dL (ref 7–25)
CO2: 18 mmol/L — ABNORMAL LOW (ref 20–32)
Calcium: 9.2 mg/dL (ref 8.6–10.4)
Chloride: 110 mmol/L (ref 98–110)
Creat: 1.09 mg/dL — ABNORMAL HIGH (ref 0.60–0.93)
GFR, Est African American: 58 mL/min/{1.73_m2} — ABNORMAL LOW (ref >=60)
GFR, Est Non African American: 50 mL/min/{1.73_m2} — ABNORMAL LOW (ref >=60)
Globulin: 3 g/dL (calc) (ref 1.9–3.7)
Glucose, Bld: 78 mg/dL (ref 65–99)
Potassium: 4.6 mmol/L (ref 3.5–5.3)
Sodium: 139 mmol/L (ref 135–146)
Total Bilirubin: 0.5 mg/dL (ref 0.2–1.2)
Total Protein: 6.4 g/dL (ref 6.1–8.1)

## 2020-01-01 LAB — CBC
HCT: 34.9 % — ABNORMAL LOW (ref 35.0–45.0)
Hemoglobin: 11.1 g/dL — ABNORMAL LOW (ref 11.7–15.5)
MCH: 30.7 pg (ref 27.0–33.0)
MCHC: 31.8 g/dL — ABNORMAL LOW (ref 32.0–36.0)
MCV: 96.4 fL (ref 80.0–100.0)
MPV: 11.7 fL (ref 7.5–12.5)
Platelets: 167 10*3/uL (ref 140–400)
RBC: 3.62 10*6/uL — ABNORMAL LOW (ref 3.80–5.10)
RDW: 13.4 % (ref 11.0–15.0)
WBC: 6.9 10*3/uL (ref 3.8–10.8)

## 2020-01-02 ENCOUNTER — Other Ambulatory Visit: Payer: Self-pay | Admitting: *Deleted

## 2020-01-02 NOTE — Patient Outreach (Signed)
[facility]) Care Management  01/02/2020  Danielle [name] 1946/02/10 [id number]   Care Coordination  Received a voice message concerning [facility] follow up. RN follow up with pt to confirm her inquire. Pt states she never received call from [facility] or had a home visit. RN offered to follow up with the [facility] agency Judson Roch). RN offered to follow up with the agency. RN spoke with [name]) who indicated the agency will not be able to accommodate the pt's care at this time for [facility] but did not contact the pt with this information. RN contacted pt with this update that agency would not be able to provide care. Encouraged pt to discuss other options with her provider if she feels ongoing PT services are needed. RN will also alert pt's provider of the above situation concerning [facility] delay.Pt is also aware of out-patient PT however she will need to drive to all appointments for this ongoing service.   No additional needs or inquires at this time. Will follow up next week with 2 weeks follow up call on pt's ongoing management of care.  Raina Mina, RN Care Management Coordinator [place] Office [phone]

## 2020-01-10 ENCOUNTER — Other Ambulatory Visit: Payer: Self-pay | Admitting: *Deleted

## 2020-01-10 NOTE — Patient Outreach (Signed)
[facility]) Care Management  01/10/2020  Danielle [name] 1945/08/27 [id number]  Telephone Assessment-Successful  RN spoke with pt today and verified she continues to do well in managing her ongoing care. States she was taking off of Entresto and placed on Coreg however she a 3 month supply of the [place]. RN strongly encouraged pt to comply with the recommendations to take the Coreg and discontinue the Entresto. Pt has another appointment on 12/1. RN encouraged pt to have another conversation with her provider on this change.   Pt states she continues to work out daily with leg exercises. Denies any residual symptoms related to her recent hospitalization with pneumonia (covid). Reports attendance to all her medical appointments with a scheduled mammagram on this Thursday and INR check on Wednesday. No acute events or symptoms reported since RN last conversation with this pt.   Plan of care discussed with all goals and interventions on track. Pt verified she received the [facility] packet and calendar for documenting her ongoing weights and appointments.  Based upon pt's seeking confirmation on a discontinued medications will follow up in a couple of weeks after pt has the pending appointment with her provider. No further inquires or request at this time.   Goals Addressed            This Visit's Progress   . THN-Make and Keep All Appointments   On track    Follow Up Date 01/29/2020   - ask family or friend for a ride - call to cancel if needed - keep a calendar with prescription refill dates - keep a calendar with appointment dates    Why is this important?   Part of staying healthy is seeing the doctor for follow-up care.  If you forget your appointments, there are some things you can do to stay on track.    Notes:     . THN-Track and Manage Symptoms   On track    Follow Up Date 02/29/2020    - begin a heart failure diary - develop a rescue plan -  eat more whole grains, fruits and vegetables, lean meats and healthy fats - follow rescue plan if symptoms flare-up - know when to call the doctor - track symptoms and what helps feel better or worse    Why is this important?   You will be able to handle your symptoms better if you keep track of them.  Making some simple changes to your lifestyle will help.  Eating healthy is one thing you can do to take good care of yourself.    Notes:        Raina Mina, RN Care Management Coordinator [place] Office [phone]

## 2020-01-11 ENCOUNTER — Other Ambulatory Visit: Payer: Self-pay

## 2020-01-11 ENCOUNTER — Encounter: Payer: Self-pay | Admitting: Internal Medicine

## 2020-01-11 ENCOUNTER — Ambulatory Visit (INDEPENDENT_AMBULATORY_CARE_PROVIDER_SITE_OTHER): Payer: Medicare Other | Admitting: Internal Medicine

## 2020-01-11 VITALS — BP 130/90 | HR 87 | Ht 67.0 in | Wt 257.0 lb

## 2020-01-11 DIAGNOSIS — E782 Mixed hyperlipidemia: Secondary | ICD-10-CM

## 2020-01-11 DIAGNOSIS — I5022 Chronic systolic (congestive) heart failure: Secondary | ICD-10-CM

## 2020-01-11 DIAGNOSIS — I10 Essential (primary) hypertension: Secondary | ICD-10-CM

## 2020-01-11 DIAGNOSIS — I480 Paroxysmal atrial fibrillation: Secondary | ICD-10-CM

## 2020-01-11 DIAGNOSIS — Z952 Presence of prosthetic heart valve: Secondary | ICD-10-CM | POA: Diagnosis not present

## 2020-01-11 MED ORDER — ENTRESTO 49-51 MG PO TABS: 0.5000 | ORAL_TABLET | Freq: Two times a day (BID) | ORAL | Status: DC

## 2020-01-11 NOTE — Patient Instructions (Signed)
Medication Instructions:  Your physician has recommended you make the following change in your medication:  1- START Entresto 49-51 mg - Take 0.5 tablet by mouth two times a day.  *If you need a refill on your cardiac medications before your next appointment, please call your pharmacy*   Follow-Up: At [facility], you and your health needs are our priority.  As part of our continuing mission to provide you with exceptional heart care, we have created designated Provider Care Teams.  These Care Teams include your primary Cardiologist (physician) and Advanced Practice Providers (APPs -  Physician Assistants and Nurse Practitioners) who all work together to provide you with the care you need, when you need it.  We recommend signing up for the patient portal called "MyChart".  Sign up information is provided on this After Visit Summary.  MyChart is used to connect with patients for Virtual Visits (Telemedicine).  Patients are able to view lab/test results, encounter notes, upcoming appointments, etc.  Non-urgent messages can be sent to your provider as well.   To learn more about what you can do with MyChart, go to [email or web address].    Your next appointment:   1 month(s)  The format for your next appointment:   In Person  Provider:   You may see [name], MD or one of the following Advanced Practice Providers on your designated Care Team:    Murray Hodgkins, NP  Christell Faith, PA-C  Marrianne Mood, PA-C  Cadence [place], [place]  Laurann Montana, NP

## 2020-01-11 NOTE — Progress Notes (Signed)
Follow-up Outpatient Visit Date: 01/11/2020  Primary Care Provider: [name], [name], MD [place] [address]  Chief Complaint: Hospital follow-up  HPI:  Ms. Boda is a 74 y.o. female with history of aortic valve disease (severe regurgitation by her description) s/p bioprosthetic aortic valve replacement in [place] in 03/2014, NICM with LVEF as low as 30-35% by report in 03/2015, paroxysmal atrial fibrillation on chronic warfarin, stroke x 2, hyperlipidemia, hypertension, hyperthyroidism,left breast cancer,spontaneous pneumothorax, and left hip fracture, who presents for follow-up of valvular heart disease, cardiomyopathy, and atrial fibrillation.  She was last seen in our office by Christell Faith, PA, in late September.  She was doing relatively well at that time but continued to note exertional dyspnea with modest activity.  Trace pedal edema had improved with escalation of diuresis.  She was hospitalized last month with COVID-19 infection despite having been vaccinated.  Due to hypotension during her hospitalization, Entresto and furosemide were discontinued.  She has remained on carvedilol albeit at a lower dose (12.5 mg twice daily.  Today, Ms. Korf reports that she is feeling close to her baseline again.  However, her breathing has "not been good."  She is using inhalers provided during her recent hospitalization with only mild improvement.  She gets out of breath with even mild activity.  However, this predates COVID-19 infection.  In fact, she feels like her breathing has gotten worse over the last 6 months.  She denies chest pain and palpitations.  She notes occasional leg edema and is now using furosemide on an as-needed basis.  Her activity is quite limited on account of her dyspnea and knee pain.  She also reports orthostatic lightheadedness when she stands up too quickly.  Recent INR was elevated at 5.  She held warfarin for a few days and is now on a  lower dose, as directed by Dr. [name].  Ms. Cuen had just purchased a supply of Entresto before her recent hospitalization and wonders if she can restart this medication.  She was tolerating it well and felt like it was helping her heart failure.  --------------------------------------------------------------------------------------------------  Cardiovascular History & Procedures: Cardiovascular Problems:  Aortic valve disease status post bioprosthetic AVR in 03/2014  Non-ischemic cardiomyopathy  Paroxysmal atrial fibrillation  Stroke  Risk Factors:  Hypertension, hyperlipidemia, stroke, and age > 38  Cath/PCI:  R/LHC (02/12/2014, [place]): Moderate proximal LAD calcification with mild, nonobstructive disease. Mildly reduced LVEF with severe apical hypokinesis. Normal pulmonary artery pressure.  CV Surgery:  Bioprosthetic aortic valve replacement (03/12/14, [place], [place], [place])  EP Procedures and Devices:  None  Non-Invasive Evaluation(s):  TTE (11/21/2019): Normal LV size with mild LVH.  LVEF 45-50% with global hypokinesis.  Normal RV size and function.  Bioprosthetic aortic valve present with appropriate function (mean gradient 10 mmHg).  TTE (01/18/2019): Normal LV size with LVEF 35-40%. Mild LVH. Grade 1 diastolic dysfunction. Is in function. Bioprosthetic aortic valve with normal function. Normal PA pressure.  TTE (04/21/16): Normal obese size with moderate LVH. LVEF 35-40% with mid and apical anteroseptal hypokinesis. Grade 3 diastolic dysfunction noted. Aortic valve bioprosthesis present with a mean gradient of 11 mmHg. Mitral annular calcification noted. Normal RV size and function. Mild right atrial enlargement.  TTE (03/27/15, OSH): Mild LVH with LVEF 30-35%, mild left atrial enlargement, AVR in place without regurgitation.  TTE (05/10/14, OSH): LVEF 45-50%.  Recent CV Pertinent Labs: Lab Results  Component Value Date    CHOL 161 01/29/2019   HDL 64.90  01/29/2019   LDLCALC 81 01/29/2019   TRIG 77.0 01/29/2019   CHOLHDL 2 01/29/2019   INR 5.2 (A) 12/31/2019   INR 2.8 (H) 12/22/2019   BNP 93.2 12/21/2019   K 4.6 12/31/2019   K 4.3 12/22/2016   MG 1.9 12/22/2019   BUN 10 12/31/2019   BUN 14 06/28/2019   BUN 19.5 12/22/2016   CREATININE 1.09 (H) 12/31/2019   CREATININE 1.2 (H) 12/22/2016    Past medical and surgical history were reviewed and updated in EPIC.  Current Meds  Medication Sig  . albuterol (PROVENTIL HFA;VENTOLIN HFA) 108 (90 Base) MCG/ACT inhaler Inhale 2 puffs into the lungs every 6 (six) hours as needed for wheezing or shortness of breath.   Marland Kitchen alendronate (FOSAMAX) 70 MG tablet Take 1 tablet (70 mg total) by mouth once a week. Take with a full glass of water on an empty stomach.  Marland Kitchen aspirin EC 81 MG tablet Take 81 mg daily by mouth.  . carvedilol (COREG) 12.5 MG tablet Take 1 tablet (12.5 mg total) by mouth 2 (two) times daily with a meal.  . Cholecalciferol 25 MCG (1000 UT) tablet Take 1,000 Units by mouth daily.  . diclofenac sodium (VOLTAREN) 1 % GEL Apply 4 g topically 4 (four) times daily.  . diphenhydramine-acetaminophen (TYLENOL PM) 25-500 MG TABS tablet Take 2 tablets by mouth at bedtime.   . furosemide (LASIX) 20 MG tablet Take 1 tablet (20 mg total) by mouth daily as needed for fluid or edema.  Marland Kitchen MELATONIN PO Take 1 tablet by mouth at bedtime.  . methimazole (TAPAZOLE) 5 MG tablet TAKE 3 TABLETS BY MOUTH IN THE EVENING . APPOINTMENT REQUIRED FOR FUTURE REFILLS  . mometasone-formoterol (DULERA) 200-5 MCG/ACT AERO Inhale 2 puffs into the lungs 2 (two) times daily.  . pantoprazole (PROTONIX) 40 MG tablet Take 1 tablet (40 mg total) by mouth daily.  . rosuvastatin (CRESTOR) 40 MG tablet Take 1 tablet (40 mg total) by mouth daily.  Marland Kitchen senna-docusate (SENOKOT-S) 8.6-50 MG tablet Take 2 tablets at bedtime by mouth.   . tamoxifen (NOLVADEX) 20 MG tablet Take 1 tablet by mouth once daily    . traMADol (ULTRAM) 50 MG tablet Take 1 tablet (50 mg total) by mouth 3 (three) times daily.  Marland Kitchen trolamine salicylate (ASPERCREME) 10 % cream Apply 1 application topically as needed for muscle pain.  . valACYclovir (VALTREX) 500 MG tablet Take 1 tablet (500 mg total) by mouth 2 (two) times daily. For 3 days when outbreaks.  Marland Kitchen warfarin (COUMADIN) 3 MG tablet TAKE AS DIRECTED BY ANTICOAGULATION CLINIC (Patient taking differently: Take 3 mg by mouth daily at 4 PM. TAKE AS DIRECTED BY  ANTICOAGULATION  CLINIC)    Allergies: Lisinopril, Tetanus toxoid adsorbed, and Other  Social History   Tobacco Use  . Smoking status: Former Smoker    Packs/day: 0.25    Years: 10.00    Pack years: 2.50    Types: Cigarettes    Quit date: 03/01/2010    Years since quitting: 9.8  . Smokeless tobacco: Never Used  Vaping Use  . Vaping Use: Never used  Substance Use Topics  . Alcohol use: No  . Drug use: No    Family History  Problem Relation Age of Onset  . Diabetes Mother   . Heart attack Mother 75  . Diabetes Father   . Lung cancer Father   . Diabetes Sister   . Thyroid disease Sister   . Diabetes Sister   .  HIV Brother     Review of Systems: A 12-system review of systems was performed and was negative except as noted in the HPI.  --------------------------------------------------------------------------------------------------  Physical Exam: BP 130/90 (BP Location: Right Arm, Patient Position: Sitting, Cuff Size: Large)   Pulse 87   Ht 5\' 7"  (1.702 m)   Wt 257 lb (116.6 kg)   SpO2 98%   BMI 40.25 kg/m   General: NAD. Neck: No JVD or HJR. Lungs: Coarse breath sounds bilaterally without wheezes or crackles. Heart: Distant heart sounds.  Regular rate and rhythm with 1/6 systolic murmur. Abdomen: Soft, nontender, nondistended. Extremities: 1+ pretibial edema, left greater than right.  EKG:  Normal sinus rhythm with inferior and anterolateral Q waves.  PVC no longer present.   Otherwise, no significant change from prior tracing on 12/19/2019.  Lab Results  Component Value Date   WBC 6.9 12/31/2019   HGB 11.1 (L) 12/31/2019   HCT 34.9 (L) 12/31/2019   MCV 96.4 12/31/2019   PLT 167 12/31/2019    Lab Results  Component Value Date   NA 139 12/31/2019   K 4.6 12/31/2019   CL 110 12/31/2019   CO2 18 (L) 12/31/2019   BUN 10 12/31/2019   CREATININE 1.09 (H) 12/31/2019   GLUCOSE 78 12/31/2019   ALT 22 12/31/2019    Lab Results  Component Value Date   CHOL 161 01/29/2019   HDL 64.90 01/29/2019   LDLCALC 81 01/29/2019   TRIG 77.0 01/29/2019   CHOLHDL 2 01/29/2019    --------------------------------------------------------------------------------------------------  ASSESSMENT AND PLAN: Chronic HFrEF: Ms. Georg Ruddle reports worsening shortness of breath over the last 6 months, complicated by UEAVW-09 infection last month.  She appears mildly volume overloaded on exam today.  I have recommended that she start taking furosemide 20 mg daily until her swelling has resolved.  We will restart Entresto at 1/2 tablet of the 49-51 dose twice daily.  Basic metabolic panel should be checked when she sees Dr. [name] in early December.  We will also have [name] return to see [place] in about a month.  If her symptoms have not improved at all at that point, we will need to consider moving forward with right and left heart catheterization to better understand her hemodynamics as well as to exclude underlying CAD.  We will continue carvedilol 12.5 mg twice daily, though hopefully this can be escalated in the future.  Ms. Dezarn has not tolerated spironolactone in the past due to hyperkalemia.  Status post aortic valve replacement: Recent echo showed appropriate valve function.  Continue aspirin and warfarin.  Paroxysmal atrial fibrillation: Ms. Georg Ruddle is maintaining sinus rhythm.  Continue warfarin and carvedilol.  Hypertension: Blood  pressure upper normal today.  We have agreed to restart Entresto, as outlined above.  Hyperlipidemia: Continue rosuvastatin.  Follow-up: Return to clinic in 1 month.  [name], MD 01/11/2020 8:41 AM

## 2020-01-14 ENCOUNTER — Ambulatory Visit (INDEPENDENT_AMBULATORY_CARE_PROVIDER_SITE_OTHER): Payer: Medicare Other | Admitting: General Practice

## 2020-01-14 ENCOUNTER — Other Ambulatory Visit: Payer: Self-pay

## 2020-01-14 ENCOUNTER — Telehealth: Payer: Self-pay | Admitting: *Deleted

## 2020-01-14 DIAGNOSIS — Z7901 Long term (current) use of anticoagulants: Secondary | ICD-10-CM

## 2020-01-14 LAB — POCT INR: INR: 1.7 — AB (ref 2.0–3.0)

## 2020-01-14 NOTE — Telephone Encounter (Signed)
Entresto patient assistance application completed and faxed to [employer].  Paper work placed in Paramedic in med room.

## 2020-01-14 NOTE — Patient Instructions (Addendum)
Pre visit review using our clinic review tool, if applicable. No additional management support is needed unless otherwise documented below in the visit note.  Take 1 1/2 tablets today and then continue to take 1 (3 mg) tablet daily.  Re-check in 3 to 4 weeks.

## 2020-01-16 ENCOUNTER — Other Ambulatory Visit: Payer: Self-pay

## 2020-01-16 ENCOUNTER — Ambulatory Visit
Admission: RE | Admit: 2020-01-16 | Discharge: 2020-01-16 | Disposition: A | Discharge status: Home / Self Care | Payer: Medicare Other | Source: Ambulatory Visit | Attending: Hematology | Admitting: Hematology

## 2020-01-16 DIAGNOSIS — R922 Inconclusive mammogram: Secondary | ICD-10-CM | POA: Diagnosis not present

## 2020-01-16 DIAGNOSIS — Z853 Personal history of malignant neoplasm of breast: Secondary | ICD-10-CM

## 2020-01-30 ENCOUNTER — Ambulatory Visit (INDEPENDENT_AMBULATORY_CARE_PROVIDER_SITE_OTHER): Payer: Medicare Other | Admitting: Family Medicine

## 2020-01-30 ENCOUNTER — Encounter: Payer: Self-pay | Admitting: Family Medicine

## 2020-01-30 ENCOUNTER — Other Ambulatory Visit: Payer: Self-pay

## 2020-01-30 VITALS — BP 134/82 | HR 77 | Temp 98.5°F | Resp 16 | Ht 67.0 in | Wt 257.2 lb

## 2020-01-30 DIAGNOSIS — I10 Essential (primary) hypertension: Secondary | ICD-10-CM | POA: Diagnosis not present

## 2020-01-30 DIAGNOSIS — J029 Acute pharyngitis, unspecified: Secondary | ICD-10-CM

## 2020-01-30 DIAGNOSIS — E0590 Thyrotoxicosis, unspecified without thyrotoxic crisis or storm: Secondary | ICD-10-CM

## 2020-01-30 DIAGNOSIS — J3089 Other allergic rhinitis: Secondary | ICD-10-CM

## 2020-01-30 DIAGNOSIS — R600 Localized edema: Secondary | ICD-10-CM

## 2020-01-30 DIAGNOSIS — R2689 Other abnormalities of gait and mobility: Secondary | ICD-10-CM

## 2020-01-30 MED ORDER — FLUTICASONE PROPIONATE 50 MCG/ACT NA SUSP: 1.0000 | Freq: Every day | NASAL | 3 refills | Status: DC

## 2020-01-30 NOTE — Progress Notes (Signed)
HPI: Danielle [name] is a 74 y.o. female, who is here today with her husband for follow up.   She was last seen on 12/31/19 for hospital follow up. Gradually feeling better. Using a walker at home but feels her legs "weak." Unstable agit due to chronic pain, still not at her baseline, afraid of falling. She has not started PT at home. According to pt, she was told the have staff shortage.  HTN,atrial fib,and HFrEF: Since her last visit she has seen her cardiologist and coumadin clinic.  Entresto was decreased from 1 tab to 1/2 tab. She is also on Carvedilol 12.5 mg bid and Furosemide 20 mg daily prn.    Concerned about LE edema, around left ankle. No pain or erythema. Not sure about exacerbating or alleviating factors. Negative for CP,worsening SOB,palpitations,or diaphoresis. Sleeping on same amount of pillows, 2. Negative for PND or decreased urine output. . Lab Results  Component Value Date   CREATININE 1.09 (H) 12/31/2019   BUN 10 12/31/2019   NA 139 12/31/2019   K 4.6 12/31/2019   CL 110 12/31/2019   CO2 18 (L) 12/31/2019   Hyperthyroidism: She is on Methimazole 15 mg daily. She has not noted abnormal wt loss,diarreha,palpation,tremor,or heat intolerance.  Lab Results  Component Value Date   TSH 0.55 01/29/2019   Today she is c/o sore throat, manly am. Gargles with salty water has helped. Started 3 days after hospital discharged and greatly improved after stopping fun at night. She has not noted fever,chills,stridor,or dysphagia. + Nasal congestion, worse when lying down.  She has not used OTC treatments.  Review of Systems  Constitutional: Positive for fatigue. Negative for chills.  HENT: Negative for mouth sores and nosebleeds.   Eyes: Negative for redness and visual disturbance.  Respiratory: Positive for shortness of breath (No more than usual.). Negative for cough and wheezing.   Gastrointestinal: Negative for abdominal pain, nausea and  vomiting.       Negative for changes in bowel habits.  Endocrine: Negative for cold intolerance.  Genitourinary: Negative for dysuria and hematuria.  Musculoskeletal: Positive for arthralgias, back pain and gait problem.  Allergic/Immunologic: Positive for environmental allergies.  Neurological: Negative for syncope and headaches.  Psychiatric/Behavioral: Negative for confusion. The patient is nervous/anxious.   Rest of ROS, see pertinent positives sand negatives in HPI  Current Outpatient Medications on File Prior to Visit  Medication Sig Dispense Refill  . albuterol (PROVENTIL HFA;VENTOLIN HFA) 108 (90 Base) MCG/ACT inhaler Inhale 2 puffs into the lungs every 6 (six) hours as needed for wheezing or shortness of breath.     Marland Kitchen alendronate (FOSAMAX) 70 MG tablet Take 1 tablet (70 mg total) by mouth once a week. Take with a full glass of water on an empty stomach. 13 tablet 3  . aspirin EC 81 MG tablet Take 81 mg daily by mouth.    . carvedilol (COREG) 12.5 MG tablet Take 1 tablet (12.5 mg total) by mouth 2 (two) times daily with a meal. 60 tablet 6  . Cholecalciferol 25 MCG (1000 UT) tablet Take 1,000 Units by mouth daily.    . diclofenac sodium (VOLTAREN) 1 % GEL Apply 4 g topically 4 (four) times daily. 500 g 3  . diphenhydramine-acetaminophen (TYLENOL PM) 25-500 MG TABS tablet Take 2 tablets by mouth at bedtime.     . furosemide (LASIX) 20 MG tablet Take 1 tablet (20 mg total) by mouth daily as needed for fluid or edema. 30 tablet 1  .  MELATONIN PO Take 1 tablet by mouth at bedtime.    . methimazole (TAPAZOLE) 5 MG tablet TAKE 3 TABLETS BY MOUTH IN THE EVENING . APPOINTMENT REQUIRED FOR FUTURE REFILLS 180 tablet 0  . mometasone-formoterol (DULERA) 200-5 MCG/ACT AERO Inhale 2 puffs into the lungs 2 (two) times daily.    . pantoprazole (PROTONIX) 40 MG tablet Take 1 tablet (40 mg total) by mouth daily. 30 tablet 0  . rosuvastatin (CRESTOR) 40 MG tablet Take 1 tablet (40 mg total) by mouth  daily. 90 tablet 1  . sacubitril-valsartan (ENTRESTO) 49-51 MG Take 0.5 tablets by mouth 2 (two) times daily. 60 tablet   . senna-docusate (SENOKOT-S) 8.6-50 MG tablet Take 2 tablets at bedtime by mouth.     . tamoxifen (NOLVADEX) 20 MG tablet Take 1 tablet by mouth once daily 90 tablet 2  . traMADol (ULTRAM) 50 MG tablet Take 1 tablet (50 mg total) by mouth 3 (three) times daily. 90 tablet 5  . trolamine salicylate (ASPERCREME) 10 % cream Apply 1 application topically as needed for muscle pain.    . valACYclovir (VALTREX) 500 MG tablet Take 1 tablet (500 mg total) by mouth 2 (two) times daily. For 3 days when outbreaks. 24 tablet 1  . warfarin (COUMADIN) 3 MG tablet TAKE AS DIRECTED BY ANTICOAGULATION CLINIC (Patient taking differently: Take 3 mg by mouth daily at 4 PM. TAKE AS DIRECTED BY  ANTICOAGULATION  CLINIC) 90 tablet 2   No current facility-administered medications on file prior to visit.   Past Medical History:  Diagnosis Date  . Allergy   . Anxiety   . Aortic stenosis    Status post bioprosthetic AVR  . Arthritis    DJD  . Asthma   . Breast cancer ([place] [facility]) 12/13/2016   Left breast  . Chronic systolic CHF (congestive heart failure) ([place])    EF 30% 04/2014  . COPD (chronic obstructive pulmonary disease) ([place])   . Coronary artery disease    per pt, had LHC prior to AVR in [place] that did not show any blockages; no stents/bypass  . Gastritis   . Hyperlipidemia   . Hypertension   . Hyperthyroidism   . Multiple thyroid nodules   . Osteoporosis   . Paroxysmal atrial fibrillation ([facility])   . Pelvis fracture ([place]) 08/19/2015   MULTIPLE   . Personal history of radiation therapy 2018  . Spontaneous pneumothorax 11/28/2015   left   . Stroke [facility]) 2000   rt hand weak   Allergies  Allergen Reactions  . Lisinopril Cough  . Tetanus Toxoid Adsorbed Swelling    Arm swelling  . Other Cough    PEANUTS    Social History   Socioeconomic History  . Marital status: Married     Spouse name: Sherwood  . Number of children: 0  . Years of education: Not on file  . Highest education level: Not on file  Occupational History  . Occupation: Retired in 2004  Tobacco Use  . Smoking status: Former Smoker    Packs/day: 0.25    Years: 10.00    Pack years: 2.50    Types: Cigarettes    Quit date: 03/01/2010    Years since quitting: 9.9  . Smokeless tobacco: Never Used  Vaping Use  . Vaping Use: Never used  Substance and Sexual Activity  . Alcohol use: No  . Drug use: No  . Sexual activity: Not Currently  Other Topics Concern  . Not on file  Social History  Narrative   Lives with husband.  Ambulated independently.   Social Determinants of Health   Financial Resource Strain: Medium Risk  . Difficulty of Paying Living Expenses: Somewhat hard  Food Insecurity: No Food Insecurity  . Worried About Charity fundraiser in the Last Year: Never true  . Ran Out of Food in the Last Year: Never true  Transportation Needs: No Transportation Needs  . Lack of Transportation (Medical): No  . Lack of Transportation (Non-Medical): No  Physical Activity: Sufficiently Active  . Days of Exercise per Week: 7 days  . Minutes of Exercise per Session: 30 min  Stress: Stress Concern Present  . Feeling of Stress : To some extent  Social Connections: Moderately Integrated  . Frequency of Communication with Friends and Family: More than three times a week  . Frequency of Social Gatherings with Friends and Family: Once a week  . Attends Religious Services: More than 4 times per year  . Active Member of Clubs or Organizations: No  . Attends Archivist Meetings: Never  . Marital Status: Married    Vitals:   01/30/20 1001  BP: 134/82  Pulse: 77  Resp: 16  Temp: 98.5 F (36.9 C)  SpO2: 94%   Body mass index is 40.28 kg/m.  Physical Exam Vitals and nursing note reviewed.  Constitutional:      General: She is not in acute distress.    Appearance: She is  well-developed.  HENT:     Head: Normocephalic and atraumatic.     Nose: No rhinorrhea.     Left Turbinates: Enlarged.  Eyes:     Conjunctiva/sclera: Conjunctivae normal.  Cardiovascular:     Rate and Rhythm: Normal rate. Rhythm irregular.     Pulses:          Dorsalis pedis pulses are 2+ on the right side and 2+ on the left side.     Heart sounds: Murmur (SEM II/VI RUSB) heard.      Comments: LE and peri ankle edema, bilateral. Varicose veins LE's,bilateral. Pulmonary:     Effort: Pulmonary effort is normal. No respiratory distress.     Breath sounds: Normal breath sounds.  Abdominal:     Palpations: Abdomen is soft.     Tenderness: There is no abdominal tenderness.  Musculoskeletal:     Right lower leg: 1+ Pitting Edema present.     Left lower leg: 1+ Pitting Edema present.  Lymphadenopathy:     Cervical: No cervical adenopathy.  Skin:    General: Skin is warm.     Findings: No erythema or rash.  Neurological:     Mental Status: She is alert and oriented to person, place, and time.     Cranial Nerves: No cranial nerve deficit.     Comments: In a wheel chair.  Psychiatric:     Comments: Well groomed, good eye contact.   ASSESSMENT AND PLAN:  Danielle [name] was seen today for follow-up.  Orders Placed This Encounter  Procedures  . TSH  . Ambulatory referral to Physical Therapy   Lab Results  Component Value Date   TSH 2.14 01/30/2020   Peripheral musculoskeletal gait disorder Fall precautions. PT will be arranged. Walking with a walker is her baseline.  -     Ambulatory referral to Physical Therapy  Essential hypertension BP adequately controlled. Continue Entresto and Carvedilol same dose. She has an appt with cardio next month. Continue monitoring BP.  Hyperthyroidism Continue Methimazole 5 mg 3 tabs daily.  Medication dose will be adjusted if needed according to TSH.  Sore throat Resolved. ? Mouth breathing,allergies,and fan on  could be aggravating factors.  Non-seasonal allergic rhinitis, unspecified trigger Intranasal Flonase at bedtime recommended. Some side effects discussed. Nasal saline irrigations as needed during the day may help also.  -     fluticasone (FLONASE) 50 MCG/ACT nasal spray; Place 1 spray into both nostrils daily.  Bilateral lower extremity edema We discussed possible etiologies. LE elevation and compression stocking will help. Appropriate skin care. Continue Furosemide 20 mg daily prn.  Return in about 6 months (around 07/30/2020) for HTN,hyperthyroidism.   Anistyn Graddy G. [name], MD  [facility]. [place] office.  A few things to remember from today's visit:   Essential hypertension  Peripheral musculoskeletal gait disorder - Plan: Ambulatory referral to Physical Therapy  Hyperthyroidism - Plan: TSH  Sore throat  Non-seasonal allergic rhinitis, unspecified trigger - Plan: fluticasone (FLONASE) 50 MCG/ACT nasal spray  Bilateral lower extremity edema  If you need refills please call your pharmacy. Do not use My Chart to request refills or for acute issues that need immediate attention.   Lower extremity elevation above your heart level may help with swelling. PT will be arranged. Fall precautions.  Sore throat most likely associated with mouth breathing.  Please be sure medication list is accurate. If a new problem present, please set up appointment sooner than planned today.

## 2020-01-30 NOTE — Patient Instructions (Addendum)
A few things to remember from today's visit:   Essential hypertension  Peripheral musculoskeletal gait disorder - Plan: Ambulatory referral to Physical Therapy  Hyperthyroidism - Plan: TSH  Sore throat  Non-seasonal allergic rhinitis, unspecified trigger - Plan: fluticasone (FLONASE) 50 MCG/ACT nasal spray  Bilateral lower extremity edema  If you need refills please call your pharmacy. Do not use My Chart to request refills or for acute issues that need immediate attention.   Lower extremity elevation above your heart level may help with swelling. PT will be arranged. Fall precautions.  Sore throat most likely associated with mouth breathing.  Please be sure medication list is accurate. If a new problem present, please set up appointment sooner than planned today.

## 2020-01-30 NOTE — Telephone Encounter (Signed)
On 01/20/20, received fax that patient approved to receive Entresto at no cost through the remainder of the year by the [employer] [facility] [place] ([facility]).

## 2020-01-31 ENCOUNTER — Other Ambulatory Visit: Payer: Self-pay | Admitting: Internal Medicine

## 2020-01-31 LAB — TSH: TSH: 2.14 mIU/L (ref 0.40–4.50)

## 2020-02-03 ENCOUNTER — Encounter: Payer: Self-pay | Admitting: Family Medicine

## 2020-02-03 MED ORDER — METHIMAZOLE 5 MG PO TABS: 15.0000 mg | ORAL_TABLET | Freq: Every day | ORAL | 2 refills | Status: DC

## 2020-02-07 DIAGNOSIS — R600 Localized edema: Secondary | ICD-10-CM | POA: Diagnosis not present

## 2020-02-11 ENCOUNTER — Other Ambulatory Visit: Payer: Self-pay | Admitting: *Deleted

## 2020-02-11 NOTE — Patient Outreach (Signed)
[facility]) Care Management  02/11/2020  Danielle [name] 1945/07/05 [id number]   Telephone Assessment-Successful  RN spoke with pt today who remains on track with her ongoing plan of care. Verified pt continue to attend all medical appointments with sufficient transportation sources, take all her prescribed medications and pt denies any additional residual symptoms related to pneumonia/Covid. Pt has reported she has stopped the Coreg and started back on Entresto. Pt states she is doing well with no acute symptoms related encounters of symptoms or illnesses.  Pt also states she will start out-patient PT services tomorrow for the next few weeks. Supportive spouse in the home. Review the plan of care as pt remains on-track. Updated care plan accordingly and will continue to offer [facility] assistance in meeting her goals.    Strongly encourage pt to contact RN case management with any related needs or resources. Will update the provider on pt's ongoing disposition with [facility] services.  Goals Addressed            This Visit's Progress   . COMPLETED: THN-Make and Keep All Appointments   On track    Follow Up Date 01/29/2020   - ask family or friend for a ride - call to cancel if needed - keep a calendar with prescription refill dates - keep a calendar with appointment dates    Why is this important?   Part of staying healthy is seeing the doctor for follow-up care.  If you forget your appointments, there are some things you can do to stay on track.    Notes:     . THN-Track and Manage Symptoms   On track    Follow Up Date 03/31/2020   - begin a heart failure diary - develop a rescue plan - eat more whole grains, fruits and vegetables, lean meats and healthy fats - follow rescue plan if symptoms flare-up - know when to call the doctor - track symptoms and what helps feel better or worse    Why is this important?   You will be able to handle your symptoms  better if you keep track of them.  Making some simple changes to your lifestyle will help.  Eating healthy is one thing you can do to take good care of yourself.    Notes: Will extend to allow ongoing recovery. Medication changes with delete of Coreg and restarted Entresto.       Raina Mina, RN Care Management Coordinator [place] Office [phone]

## 2020-02-12 ENCOUNTER — Other Ambulatory Visit: Payer: Self-pay

## 2020-02-12 ENCOUNTER — Ambulatory Visit: Payer: Medicare Other | Attending: Family Medicine

## 2020-02-12 DIAGNOSIS — M25562 Pain in left knee: Secondary | ICD-10-CM | POA: Diagnosis not present

## 2020-02-12 DIAGNOSIS — R293 Abnormal posture: Secondary | ICD-10-CM | POA: Insufficient documentation

## 2020-02-12 DIAGNOSIS — Z7409 Other reduced mobility: Secondary | ICD-10-CM | POA: Diagnosis not present

## 2020-02-12 DIAGNOSIS — M6281 Muscle weakness (generalized): Secondary | ICD-10-CM | POA: Diagnosis not present

## 2020-02-12 DIAGNOSIS — R262 Difficulty in walking, not elsewhere classified: Secondary | ICD-10-CM | POA: Insufficient documentation

## 2020-02-12 DIAGNOSIS — R2689 Other abnormalities of gait and mobility: Secondary | ICD-10-CM | POA: Insufficient documentation

## 2020-02-12 DIAGNOSIS — M25561 Pain in right knee: Secondary | ICD-10-CM | POA: Diagnosis not present

## 2020-02-12 DIAGNOSIS — G8929 Other chronic pain: Secondary | ICD-10-CM | POA: Insufficient documentation

## 2020-02-12 NOTE — Therapy (Signed)
[place], [place], [address] Phone: [phone]   Fax:  [phone]  Physical Therapy Evaluation  Patient Details  Name: Danielle [name] MRN: [id number] Date of Birth: 13-Sep-1945 Referring Provider (PT): [name], MD   Encounter Date: 02/12/2020   PT End of Session - 02/12/20 0811    Visit Number 1    Number of Visits 16    Date for PT Re-Evaluation 04/08/20    Authorization Type [employer] Medicare    Progress Note Due on Visit 10    PT Start Time 0800    PT Stop Time 0845    PT Time Calculation (min) 45 min    Activity Tolerance Patient tolerated treatment well    Behavior During Therapy [facility] for tasks assessed/performed           Past Medical History:  Diagnosis Date  . Allergy   . Anxiety   . Aortic stenosis    Status post bioprosthetic AVR  . Arthritis    DJD  . Asthma   . Breast cancer ([place]) 12/13/2016   Left breast  . Chronic systolic CHF (congestive heart failure) ([place])    EF 30% 04/2014  . COPD (chronic obstructive pulmonary disease) ([place])   . Coronary artery disease    per pt, had LHC prior to AVR in [place] that did not show any blockages; no stents/bypass  . Gastritis   . Hyperlipidemia   . Hypertension   . Hyperthyroidism   . Multiple thyroid nodules   . Osteoporosis   . Paroxysmal atrial fibrillation ([facility])   . Pelvis fracture ([place]) 08/19/2015   MULTIPLE   . Personal history of radiation therapy 2018  . Spontaneous pneumothorax 11/28/2015   left   . Stroke [facility]) 2000   rt hand weak    Past Surgical History:  Procedure Laterality Date  . BREAST LUMPECTOMY Left 12/2016  . BREAST LUMPECTOMY WITH RADIOACTIVE SEED AND SENTINEL LYMPH NODE BIOPSY Left 01/14/2017   Procedure: LEFT BREAST LUMPECTOMY WITH RADIOACTIVE SEED AND SENTINEL LYMPH NODE BIOPSY;  Surgeon: Rolm Bookbinder, MD;  Location: [place];  Service: General;  Laterality: Left;  . CHEST TUBE  INSERTION  10/2015  . INTRAMEDULLARY (IM) NAIL INTERTROCHANTERIC Left 12/10/2015   Procedure: INTRAMEDULLARY (IM) NAIL INTERTROCHANTRIC;  Surgeon: Leandrew Koyanagi, MD;  Location: [place];  Service: Orthopedics;  Laterality: Left;  . PLEURADESIS Left 12/03/2015   Procedure: PLEURADESIS;  Surgeon: Melrose Nakayama, MD;  Location: [place];  Service: Thoracic;  Laterality: Left;  . RESECTION OF APICAL BLEB Left 12/03/2015   Procedure: BLEBECTOMY;  Surgeon: Melrose Nakayama, MD;  Location: [place];  Service: Thoracic;  Laterality: Left;  . THORACOSCOPY  12/03/2015  . VALVE REPLACEMENT    . VIDEO ASSISTED THORACOSCOPY Left 12/03/2015   Procedure: VIDEO ASSISTED THORACOSCOPY;  Surgeon: Melrose Nakayama, MD;  Location: [place];  Service: Thoracic;  Laterality: Left;    There were no vitals filed for this visit.    Subjective Assessment - 02/12/20 0813    Subjective Pt reports she fell 2 years ago when her walker got caught at the end of her bed and she went to [facility]. She had to have L IM nailing, which took a long time to heal. Since then, she has left lower back pain when she stands up. She can't walk very far due to shortness of breath from COPD and feels limited from that and her balance.    Pertinent History  L IM nailing 2 years ago, Osteoporosis, Breast Cancer 4 years ago    Limitations Standing;Walking;House hold activities;Lifting    How long can you sit comfortably? unlimited    How long can you stand comfortably? 5-10 min --> left low back pain (since fall 2 years ago)    How long can you walk comfortably? bed to living room and kitchen before feeling OOB from COPD    Diagnostic tests CT Head: Mild chronic ischemic white matter disease. 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 No acute intracranial abnormality seen.    Patient Stated Goals To walk by myself, lose weight gained during COVID    Currently in Pain? Yes    Pain Score 0-No pain   gets to a 9/10   Pain Location Back     Pain Orientation Left;Lower    Pain Descriptors / Indicators Aching    Pain Type Chronic pain    Pain Onset More than a month ago    Pain Frequency Intermittent    Aggravating Factors  standing, walking    Pain Relieving Factors sitting    Effect of Pain on Daily Activities limits mobility              [facility] PT Assessment - 02/12/20 0001      Assessment   Medical Diagnosis Peripheral Musculoskeletal Gait Disorder    Referring Provider (PT) [name], MD    Onset Date/Surgical Date --   2 years ago   Hand Dominance Right    Next MD Visit 6 mo    Prior Therapy 2020 for R knee pain      Precautions   Precautions None      Restrictions   Weight Bearing Restrictions No    Other Position/Activity Restrictions uses 4 wheel walker      Balance Screen   Has the patient fallen in the past 6 months No    Has the patient had a decrease in activity level because of a fear of falling?  Yes    Is the patient reluctant to leave their home because of a fear of falling?  Yes      [place] Private residence    Living Arrangements Spouse/significant other    Available Help at Discharge Family    Type of [place] Other (comment)   1 little step   Home Layout One level    [place] - 4 wheels;Wheelchair - Brewing technologist - built in      Prior Function   Level of Independence Independent with basic ADLs;Independent with household mobility with device    Vocation Retired    Leisure walk in the neighborhood, puzzles      Observation/Other Assessments   Focus on Therapeutic Outcomes (FOTO)  41% ability      ROM / Strength   AROM / PROM / Strength Strength      Strength   Strength Assessment Site Hip;Knee    Right/Left Hip Right;Left    Right Hip Flexion 3+/5    Right Hip Extension 2+/5    Right Hip ABduction 4-/5    Right Hip ADduction 4-/5    Left Hip Flexion 3+/5    Left Hip Extension 2+/5    Left Hip ABduction  4-/5    Left Hip ADduction 4-/5    Right/Left Knee Right;Left    Right Knee Flexion 4-/5    Right Knee Extension 3+/5  Left Knee Flexion 4/5    Left Knee Extension 4/5      Transfers   Five time sit to stand comments  59 seconds   out of breath following test     Ambulation/Gait   Ambulation/Gait Yes    Ambulation/Gait Assistance 6: Modified independent (Device/Increase time)    Ambulation Distance (Feet) 50 Feet    Assistive device 4-wheeled walker    Gait Pattern Decreased step length - left;Decreased step length - right;Decreased dorsiflexion - right;Decreased dorsiflexion - left;Lateral hip instability;Trunk flexed    Ambulation Surface Level;Indoor    Gait Comments out of breath, O2 95% on RA, HR 91 bpm                      Objective measurements completed on examination: See above findings.               PT Education - 02/12/20 0904    Education Details Diagnosis, prognosis, HEP, POC, FOTO    Person(s) Educated Patient    Methods Explanation;Demonstration;Verbal cues;Handout;Tactile cues    Comprehension Returned demonstration;Verbalized understanding;Verbal cues required;Tactile cues required            PT Short Term Goals - 02/12/20 0913      PT SHORT TERM GOAL #1   Title Independent with HEP    Time 3    Period Weeks    Status New    Target Date 03/04/20      PT SHORT TERM GOAL #2   Title Baseline TUG and create LTG.    Time 3    Period Weeks    Status New    Target Date 03/04/20      PT SHORT TERM GOAL #3   Title Pt will improve 5XSTS to <40s to demonstrated improved LE strength and balance.    Baseline 59s    Time 4    Period Weeks    Status New    Target Date 03/11/20             PT Long Term Goals - 02/12/20 0915      PT LONG TERM GOAL #1   Title Pt will be independent with long term HEP for maintenance and continued progression of functional mobility.    Time 8    Period Weeks    Status New    Target Date  04/08/20      PT LONG TERM GOAL #2   Title Pt will improve 5XSTS to <30s to progress toward age related normal of 12.6s for improved dynamic balance and functional mobility.    Baseline 59s    Time 8    Period Weeks    Status New    Target Date 04/08/20      PT LONG TERM GOAL #3   Title Pt will improve FOTO outcome measure score to 52% or greater ability to demo significant change in perceived functional ability.    Baseline 41% ability    Time 8    Period Weeks    Status New    Target Date 04/08/20      PT LONG TERM GOAL #4   Title Pt will ambulate 250 ft or greater MOD I with LRAD for improved endurance and functional mobility.    Baseline OOB after 50 feet with 4 wheeled walker    Time 8    Period Weeks    Status New    Target Date 04/08/20      PT  LONG TERM GOAL #5   Title Pt will gross LE MMT to 4+/5 for improved strength.    Time 8    Period Weeks    Status New    Target Date 04/08/20                  Plan - 02/12/20 0904    Clinical Impression Statement Pt is a 74 yo female who presents with multiple comorbidities affecting functional mobility and endurance. Pt was out of breath after ambulating 50 feet from waiting room to evaluation room and following 5XSTS taking 59 seconds to complete. Age related norm for 50-79 yo is 12.6 seconds to complete, indicating significant reduction in functional mobility and dynamic balance. Pt demonstrates impairments in transfers, posture, decreased strength, pain, and slow gait speed. She was educated on importance of strengthening and increasing endurance for improved walking as her desired goal is to ambulate independently, diagnosis, prognosis, POC, and HEP verbalizing understanding and consent to tx. Pt will benefit from skilled PT to treat impairments 1-2x/week for 8 weeks.    Personal Factors and Comorbidities Age;Fitness;Time since onset of injury/illness/exacerbation;Comorbidity 3+;Past/Current Experience    Comorbidities  Breast cancer 4 years ago, arthritis, COPD, CHF, HTN, HLD, thyroid disease, osteoporosis, stroke    Examination-Activity Limitations Lift;Squat;Transfers;Stairs;Locomotion Level;Stand;Bend;Carry    Examination-Participation Restrictions Meal Prep;Cleaning;Laundry;Shop;Community Activity;Interpersonal Relationship    Stability/Clinical Decision Making Evolving/Moderate complexity    Clinical Decision Making Moderate    Rehab Potential Good    PT Frequency --   1-2x/week   PT Duration 8 weeks    PT Treatment/Interventions Manual techniques;Patient/family education;Neuromuscular re-education;Balance training;Therapeutic exercise;ADLs/Self Care Home Management;Moist Heat;Cryotherapy;Electrical Stimulation;Gait training;Stair training;Functional mobility training;Therapeutic activities    PT Next Visit Plan Assess response to HEP and update PRN, TUG test, progress endurance and strength for improved functional mobility    PT Home Exercise Plan [email or web address]: LAQ, seated/supine march, SLR, clams, sit to stands    Consulted and Agree with Plan of Care Patient           Patient will benefit from skilled therapeutic intervention in order to improve the following deficits and impairments:  Difficulty walking,Decreased mobility,Decreased endurance,Decreased balance,Cardiopulmonary status limiting activity,Hypomobility,Obesity,Improper body mechanics,Impaired perceived functional ability,Pain,Postural dysfunction,Decreased strength,Decreased activity tolerance  Visit Diagnosis: Peripheral musculoskeletal gait disorder - Plan: PT plan of care cert/re-cert  Difficulty in walking, not elsewhere classified - Plan: PT plan of care cert/re-cert  Abnormal posture - Plan: PT plan of care cert/re-cert  Muscle weakness (generalized) - Plan: PT plan of care cert/re-cert  Decreased functional mobility and endurance - Plan: PT plan of care cert/re-cert     Problem List Patient Active Problem List   Diagnosis  Date Noted  . Pneumonia due to COVID-19 virus   . Bacteremia due to Gram-positive bacteria 08/29/2018  . Abscess   . Cellulitis of left breast 08/28/2018  . Cellulitis of left abdominal wall 08/28/2018  . Encounter for routine gynecological examination 04/28/2018  . CKD (chronic kidney disease), stage III ([place]) 09/27/2017  . Recurrent genital herpes 04/13/2017  . Chronic pain disorder 01/25/2017  . Malignant neoplasm of upper-outer quadrant of left breast in female, estrogen receptor positive ([place]) 12/16/2016  . Chronic knee pain 12/07/2016  . Chronic bilateral low back pain without sciatica 12/07/2016  . Peripheral musculoskeletal gait disorder 12/07/2016  . Encounter for therapeutic drug monitoring 06/16/2016  . Generalized osteoarthritis of multiple sites 05/04/2016  . Chronic anticoagulation 05/04/2016  . Stroke ([place]) 04/14/2016  . Aortic valve disease 04/10/2016  .  S/P AVR 04/10/2016  . NICM (nonischemic cardiomyopathy) ([place]) 04/10/2016  . Upper airway cough syndrome 03/14/2016  . Morbid obesity ([place]) 03/14/2016  . COPD (chronic obstructive pulmonary disease) ([place]) 03/11/2016  . Thrombocytopenia ([place]) 12/12/2015  . Anemia 12/12/2015  . Vitamin D deficiency 12/12/2015  . Acute urinary retention 12/11/2015  . Osteoporosis 12/11/2015  . Hip fracture ([place]) 12/10/2015  . Aortic atherosclerosis ([place]) 12/10/2015  . Lung blebs ([place]) 12/03/2015  . Pelvic fracture ([place]) 08/19/2015  . Fall at home 08/19/2015  . Essential hypertension 08/19/2015  . Mixed hyperlipidemia 08/19/2015  . Asthma 08/19/2015  . Paroxysmal atrial fibrillation ([place]) 08/19/2015  . Hyperthyroidism 08/19/2015  . Chronic HFrEF (heart failure with reduced ejection fraction) ([place])   . Coronary artery disease   . Aortic stenosis     Izell Le Roy, PT, DPT 02/12/2020, 9:20 AM  [facility] [address] [place], [place], [address] Phone: [phone]   Fax:   [phone]  Name: Danielle [name] MRN: [id number] Date of Birth: 01-15-1946

## 2020-02-12 NOTE — Patient Instructions (Signed)
Access Code: [email or web address] URL: [email or web address] Date: 02/12/2020 Prepared by: Kathreen Cornfield  Exercises Seated Long Arc Quad - 1 x daily - 7 x weekly - 2 sets - 10 reps Seated March - 1 x daily - 7 x weekly - 2 sets - 10 reps Supine Active Straight Leg Raise - 1 x daily - 7 x weekly - 2 sets - 5 reps Supine March - 1 x daily - 7 x weekly - 2 sets - 10 reps Clamshell - 1 x daily - 7 x weekly - 2 sets - 10 reps Sit to Stand with Counter Support - 1 x daily - 7 x weekly - 2 sets - 5 reps

## 2020-02-14 ENCOUNTER — Other Ambulatory Visit: Payer: Self-pay | Admitting: Internal Medicine

## 2020-02-16 ENCOUNTER — Other Ambulatory Visit: Payer: Self-pay | Admitting: Hematology

## 2020-02-18 ENCOUNTER — Ambulatory Visit (INDEPENDENT_AMBULATORY_CARE_PROVIDER_SITE_OTHER): Payer: Medicare Other | Admitting: General Practice

## 2020-02-18 ENCOUNTER — Other Ambulatory Visit: Payer: Self-pay

## 2020-02-18 DIAGNOSIS — Z7901 Long term (current) use of anticoagulants: Secondary | ICD-10-CM | POA: Diagnosis not present

## 2020-02-18 LAB — POCT INR: INR: 2.6 (ref 2.0–3.0)

## 2020-02-18 NOTE — Patient Instructions (Signed)
Pre visit review using our clinic review tool, if applicable. No additional management support is needed unless otherwise documented below in the visit note.  Continue to take 1 (3 mg) tablet daily.  Re-check in 4 weeks.  

## 2020-02-20 ENCOUNTER — Ambulatory Visit: Payer: Medicare Other

## 2020-02-20 ENCOUNTER — Other Ambulatory Visit: Payer: Self-pay

## 2020-02-20 DIAGNOSIS — R262 Difficulty in walking, not elsewhere classified: Secondary | ICD-10-CM

## 2020-02-20 DIAGNOSIS — M6281 Muscle weakness (generalized): Secondary | ICD-10-CM

## 2020-02-20 DIAGNOSIS — G8929 Other chronic pain: Secondary | ICD-10-CM | POA: Diagnosis not present

## 2020-02-20 DIAGNOSIS — M25562 Pain in left knee: Secondary | ICD-10-CM

## 2020-02-20 DIAGNOSIS — R2689 Other abnormalities of gait and mobility: Secondary | ICD-10-CM | POA: Diagnosis not present

## 2020-02-20 DIAGNOSIS — R293 Abnormal posture: Secondary | ICD-10-CM

## 2020-02-20 DIAGNOSIS — Z7409 Other reduced mobility: Secondary | ICD-10-CM | POA: Diagnosis not present

## 2020-02-20 DIAGNOSIS — M25561 Pain in right knee: Secondary | ICD-10-CM | POA: Diagnosis not present

## 2020-02-20 NOTE — Therapy (Signed)
[place], [place], [address] Phone: [phone]   Fax:  [phone]  Physical Therapy Treatment  Patient Details  Name: Danielle [name] MRN: [id number] Date of Birth: [birth year] Referring Provider (PT): [name], MD   Encounter Date: 02/20/2020   PT End of Session - 02/20/20 1723    Visit Number 2    Number of Visits 16    Date for PT Re-Evaluation 04/08/20    Authorization Type [employer] Medicare    Progress Note Due on Visit 10    PT Start Time 1618    PT Stop Time 1702    PT Time Calculation (min) 44 min    Equipment Utilized During Treatment Other (comment)   rollator   Activity Tolerance Patient tolerated treatment well    Behavior During Therapy [facility] for tasks assessed/performed           Past Medical History:  Diagnosis Date  . Allergy   . Anxiety   . Aortic stenosis    Status post bioprosthetic AVR  . Arthritis    DJD  . Asthma   . Breast cancer ([place]) 12/13/2016   Left breast  . Chronic systolic CHF (congestive heart failure) ([place])    EF 30% 04/2014  . COPD (chronic obstructive pulmonary disease) ([place])   . Coronary artery disease    per pt, had LHC prior to AVR in [place] that did not show any blockages; no stents/bypass  . Gastritis   . Hyperlipidemia   . Hypertension   . Hyperthyroidism   . Multiple thyroid nodules   . Osteoporosis   . Paroxysmal atrial fibrillation (HCC)   . Pelvis fracture ([place]) 08/19/2015   MULTIPLE   . Personal history of radiation therapy 2018  . Spontaneous pneumothorax 11/28/2015   left   . Stroke [facility]) 2000   rt hand weak    Past Surgical History:  Procedure Laterality Date  . BREAST LUMPECTOMY Left 12/2016  . BREAST LUMPECTOMY WITH RADIOACTIVE SEED AND SENTINEL LYMPH NODE BIOPSY Left 01/14/2017   Procedure: LEFT BREAST LUMPECTOMY WITH RADIOACTIVE SEED AND SENTINEL LYMPH NODE BIOPSY;  Surgeon: Rolm Bookbinder, MD;  Location:  [place];  Service: General;  Laterality: Left;  . CHEST TUBE INSERTION  10/2015  . INTRAMEDULLARY (IM) NAIL INTERTROCHANTERIC Left 12/10/2015   Procedure: INTRAMEDULLARY (IM) NAIL INTERTROCHANTRIC;  Surgeon: Leandrew Koyanagi, MD;  Location: [place];  Service: Orthopedics;  Laterality: Left;  . PLEURADESIS Left 12/03/2015   Procedure: PLEURADESIS;  Surgeon: Melrose Nakayama, MD;  Location: [place];  Service: Thoracic;  Laterality: Left;  . RESECTION OF APICAL BLEB Left 12/03/2015   Procedure: BLEBECTOMY;  Surgeon: Melrose Nakayama, MD;  Location: [place];  Service: Thoracic;  Laterality: Left;  . THORACOSCOPY  12/03/2015  . VALVE REPLACEMENT    . VIDEO ASSISTED THORACOSCOPY Left 12/03/2015   Procedure: VIDEO ASSISTED THORACOSCOPY;  Surgeon: Melrose Nakayama, MD;  Location: [place];  Service: Thoracic;  Laterality: Left;    There were no vitals filed for this visit.   Subjective Assessment - 02/20/20 1637    Subjective Pt reports she has been completing her HEP, but has difficulty completing bridging. Pt states she has a Cubii floor stepper she can use when sitting down. pt does 2000 steps a day.    Pertinent History L IM nailing 2 years ago, Osteoporosis, Breast Cancer 4 years ago    Limitations Standing;Walking;House hold activities;Lifting    How long can you  sit comfortably? unlimited    Currently in Pain? Yes    Pain Score 0-No pain   9/10 in standing   Pain Location Back    Pain Orientation Left;Lower    Pain Descriptors / Indicators Aching    Pain Type Chronic pain    Pain Onset More than a month ago    Pain Frequency Intermittent    Aggravating Factors  standing, walking    Pain Relieving Factors sitting    Effect of Pain on Daily Activities limits mobility    Multiple Pain Sites Yes    Pain Score 5    Pain Location Knee    Pain Orientation Right;Left    Pain Descriptors / Indicators Aching    Pain Type Chronic pain    Pain Onset More than a month ago    Aggravating Factors   prolonged walking or standing    Pain Relieving Factors Sitting                             [facility] Adult PT Treatment/Exercise - 02/20/20 0001      Transfers   Transfers Sit to Supine   min A with LEs     Ambulation/Gait   Ambulation/Gait Yes    Ambulation/Gait Assistance 6: Modified independent (Device/Increase time)    Assistive device 4-wheeled walker    Gait Pattern Decreased step length - left;Decreased step length - right;Decreased dorsiflexion - right;Decreased dorsiflexion - left;Lateral hip instability;Trunk flexed    Gait velocity slow pace    Gait Comments DOE walking into and out of gym from the waiting area      Exercises   Exercises Knee/Hip      Knee/Hip Exercises: Seated   Long Arc Quad Right;Left;2 sets;10 reps    [facility] 10x2    Clamshell with Thera[employer] Green   L and R, 10x2   Other Seated Knee/Hip Exercises Ankle rocks   10x2   Marching Right;Left;2 sets;10 reps    Sit to [employer] 5 reps   use of hands     Knee/Hip Exercises: Supine   Bridges Both;2 sets;10 reps   incomplete clearance   Straight Leg Raises Right;Left;2 sets;10 reps    Other Supine Knee/Hip Exercises Marching 10x2    Other Supine Knee/Hip Exercises PPT 10x2, 3 sec hold                  PT Education - 02/20/20 1722    Education Details HEP for ther ex to be completed either in sitting or suine. Pursed lip breathing for recovery from DOE.    Person(s) Educated Patient    Methods Explanation;Demonstration;Tactile cues;Verbal cues;Handout    Comprehension Verbalized understanding;Returned demonstration;Verbal cues required;Tactile cues required;Need further instruction            PT Short Term Goals - 02/12/20 0913      PT SHORT TERM GOAL #1   Title Independent with HEP    Time 3    Period Weeks    Status New    Target Date 03/04/20      PT SHORT TERM GOAL #2   Title Baseline TUG and create LTG.    Time 3    Period Weeks    Status New    Target Date  03/04/20      PT SHORT TERM GOAL #3   Title Pt will improve 5XSTS to <40s to demonstrated improved LE strength and balance.  Baseline 59s    Time 4    Period Weeks    Status New    Target Date 03/11/20             PT Long Term Goals - 02/12/20 0915      PT LONG TERM GOAL #1   Title Pt will be independent with long term HEP for maintenance and continued progression of functional mobility.    Time 8    Period Weeks    Status New    Target Date 04/08/20      PT LONG TERM GOAL #2   Title Pt will improve 5XSTS to <30s to progress toward age related normal of 12.6s for improved dynamic balance and functional mobility.    Baseline 59s    Time 8    Period Weeks    Status New    Target Date 04/08/20      PT LONG TERM GOAL #3   Title Pt will improve FOTO outcome measure score to 52% or greater ability to demo significant change in perceived functional ability.    Baseline 41% ability    Time 8    Period Weeks    Status New    Target Date 04/08/20      PT LONG TERM GOAL #4   Title Pt will ambulate 250 ft or greater MOD I with LRAD for improved endurance and functional mobility.    Baseline OOB after 50 feet with 4 wheeled walker    Time 8    Period Weeks    Status New    Target Date 04/08/20      PT LONG TERM GOAL #5   Title Pt will gross LE MMT to 4+/5 for improved strength.    Time 8    Period Weeks    Status New    Target Date 04/08/20                 Plan - 02/20/20 1724    Clinical Impression Statement PT was completed for LE/trunk strengthening in sitting and supine. Pt participated with good effort. Due to COPD, pt become DOE and needs brief rest. Frequent cueing was provided for a slower pace with the exs. HEP was developed to give pt the option of completing her exs either in sitting or supine. Pt will benefit from continued PT to address strength and tolerance to activity.    Personal Factors and Comorbidities Age;Fitness;Time since onset of  injury/illness/exacerbation;Comorbidity 3+;Past/Current Experience    Comorbidities Breast cancer 4 years ago, arthritis, COPD, CHF, HTN, HLD, thyroid disease, osteoporosis, stroke    Examination-Activity Limitations Lift;Squat;Transfers;Stairs;Locomotion Level;Stand;Bend;Carry    Examination-Participation Restrictions Meal Prep;Cleaning;Laundry;Shop;Community Activity;Interpersonal Relationship    Stability/Clinical Decision Making Evolving/Moderate complexity    Clinical Decision Making Moderate    Rehab Potential Good    PT Frequency 2x / week    PT Duration 8 weeks    PT Treatment/Interventions Manual techniques;Patient/family education;Neuromuscular re-education;Balance training;Therapeutic exercise;ADLs/Self Care Home Management;Moist Heat;Cryotherapy;Electrical Stimulation;Gait training;Stair training;Functional mobility training;Therapeutic activities    PT Next Visit Plan Assess response to HEP and update PRN, TUG test, progress endurance and strength for improved functional mobility    PT Home Exercise Plan [id number]: LAQ, seated/supine march, SLR, clams, sit to stands. HEP to complete exs either in supine or sitting.           Patient will benefit from skilled therapeutic intervention in order to improve the following deficits and impairments:  Difficulty walking,Decreased mobility,Decreased endurance,Decreased balance,Cardiopulmonary status limiting  activity,Hypomobility,Obesity,Improper body mechanics,Impaired perceived functional ability,Pain,Postural dysfunction,Decreased strength,Decreased activity tolerance  Visit Diagnosis: Peripheral musculoskeletal gait disorder  Difficulty in walking, not elsewhere classified  Abnormal posture  Muscle weakness (generalized)  Decreased functional mobility and endurance  Chronic pain of right knee  Chronic pain of left knee     Problem List Patient Active Problem List   Diagnosis Date Noted  . Pneumonia due to COVID-19 virus    . Bacteremia due to Gram-positive bacteria 08/29/2018  . Abscess   . Cellulitis of left breast 08/28/2018  . Cellulitis of left abdominal wall 08/28/2018  . Encounter for routine gynecological examination 04/28/2018  . CKD (chronic kidney disease), stage III ([place]) 09/27/2017  . Recurrent genital herpes 04/13/2017  . Chronic pain disorder 01/25/2017  . Malignant neoplasm of upper-outer quadrant of left breast in female, estrogen receptor positive ([place]) 12/16/2016  . Chronic knee pain 12/07/2016  . Chronic bilateral low back pain without sciatica 12/07/2016  . Peripheral musculoskeletal gait disorder 12/07/2016  . Encounter for therapeutic drug monitoring 06/16/2016  . Generalized osteoarthritis of multiple sites 05/04/2016  . Chronic anticoagulation 05/04/2016  . Stroke ([place]) 04/14/2016  . Aortic valve disease 04/10/2016  . S/P AVR 04/10/2016  . NICM (nonischemic cardiomyopathy) ([place]) 04/10/2016  . Upper airway cough syndrome 03/14/2016  . Morbid obesity ([place]) 03/14/2016  . COPD (chronic obstructive pulmonary disease) ([place]) 03/11/2016  . Thrombocytopenia ([place]) 12/12/2015  . Anemia 12/12/2015  . Vitamin D deficiency 12/12/2015  . Acute urinary retention 12/11/2015  . Osteoporosis 12/11/2015  . Hip fracture ([place]) 12/10/2015  . Aortic atherosclerosis ([place]) 12/10/2015  . Lung blebs ([place]) 12/03/2015  . Pelvic fracture ([place]) 08/19/2015  . Fall at home 08/19/2015  . Essential hypertension 08/19/2015  . Mixed hyperlipidemia 08/19/2015  . Asthma 08/19/2015  . Paroxysmal atrial fibrillation ([place]) 08/19/2015  . Hyperthyroidism 08/19/2015  . Chronic HFrEF (heart failure with reduced ejection fraction) ([place])   . Coronary artery disease   . Aortic stenosis     Gar Ponto MS, PT 02/20/20 5:36 PM   [facility] [address] [place], [place], [address] Phone: [phone]   Fax:  [phone]  Name: Danielle [name] MRN:  [id number] Date of Birth: 22-Feb-1946

## 2020-02-25 NOTE — Progress Notes (Signed)
Follow-up Outpatient Visit Date: 02/27/2020  Primary Care Provider: [place], [name], MD [address] [place] [address]  Chief Complaint: Shortness of breath  HPI:  Danielle [name] is a 74 y.o. female with history of aortic valve disease (severe regurgitation by her description) s/p bioprosthetic aortic valve replacement in [place] in 03/2014, NICM with LVEF as low as 30-35% by report in 03/2015, paroxysmal atrial fibrillation on chronic warfarin, stroke x 2, hyperlipidemia, hypertension, hyperthyroidism,left breast cancer,spontaneous pneumothorax, and left hip fracture, who presents for follow-up of chronic HFrEF, paroxysmal atrial fibrillation, and valvular heart disease.  I last saw her in mid November, at which time she complained that her breathing was not good in the setting of COVID-19 infection a month earlier.  She appeared mildly volume overloaded on examination, prompting [place] to start furosemide 20 mg daily until her leg swelling had resolved.  Low-dose Sherryll Burger was restarted, as it had been held during her hospitalization with COVID-19 due to soft blood pressure.  A, the patient reports that she continues to get short of breath with mild activity, no different than at our last visit.  She also notes frequent swelling of her left leg and is currently undergoing evaluation by vascular surgery for venous insufficiency.  She has been using compression stockings with some improvement.  She denies chest pain.  She reports one episode of palpitations during which it felt as though her heart was racing.  There was associated diaphoresis.  This lasted for about a minute.  Her family checked her blood pressure and heart rate and told her that both seem to be okay.  She denies lightheadedness and dizziness.  She is trying to work with physical therapy but is limited by her shortness of breath.  She remains compliant with her medications, including furosemide and Entresto that were  restarted at our last visit.  She has not had any bleeding, remaining on warfarin that is managed through Dr. Elvis Coil office.Marland Kitchen  --------------------------------------------------------------------------------------------------  Cardiovascular History & Procedures: Cardiovascular Problems:  Aortic valve disease status post bioprosthetic AVR in 03/2014  Non-ischemic cardiomyopathy  Paroxysmal atrial fibrillation  Stroke  Risk Factors:  Hypertension, hyperlipidemia, stroke, and age > 64  Cath/PCI:  R/LHC (02/12/2014, [name]): Moderate proximal LAD calcification with mild, nonobstructive disease. Mildly reduced LVEF with severe apical hypokinesis. Normal pulmonary artery pressure.  CV Surgery:  Bioprosthetic aortic valve replacement (03/12/14, [place], [place], [place])  EP Procedures and Devices:  None  Non-Invasive Evaluation(s):  TTE (11/21/2019): Normal LV size with mild LVH.  LVEF 45-50% with global hypokinesis.  Normal RV size and function.  Bioprosthetic aortic valve present with appropriate function (mean gradient 10 mmHg).  TTE (01/18/2019): Normal LV size with LVEF 35-40%. Mild LVH. Grade 1 diastolic dysfunction. Is in function. Bioprosthetic aortic valve with normal function. Normal PA pressure.  TTE (04/21/16): Normal obese size with moderate LVH. LVEF 35-40% with mid and apical anteroseptal hypokinesis. Grade 3 diastolic dysfunction noted. Aortic valve bioprosthesis present with a mean gradient of 11 mmHg. Mitral annular calcification noted. Normal RV size and function. Mild right atrial enlargement.  TTE (03/27/15, OSH): Mild LVH with LVEF 30-35%, mild left atrial enlargement, AVR in place without regurgitation.  TTE (05/10/14, OSH): LVEF 45-50%.   Recent CV Pertinent Labs: Lab Results  Component Value Date   CHOL 161 01/29/2019   HDL 64.90 01/29/2019   LDLCALC 81 01/29/2019   TRIG 77.0 01/29/2019   CHOLHDL 2 01/29/2019   INR 2.6  02/18/2020   INR  2.8 (H) 12/22/2019   BNP 93.2 12/21/2019   K 4.6 12/31/2019   K 4.3 12/22/2016   MG 1.9 12/22/2019   BUN 10 12/31/2019   BUN 14 06/28/2019   BUN 19.5 12/22/2016   CREATININE 1.09 (H) 12/31/2019   CREATININE 1.2 (H) 12/22/2016    Past medical and surgical history were reviewed and updated in EPIC.  Current Meds  Medication Sig  . albuterol (PROVENTIL HFA;VENTOLIN HFA) 108 (90 Base) MCG/ACT inhaler Inhale 2 puffs into the lungs every 6 (six) hours as needed for wheezing or shortness of breath.   Marland Kitchen alendronate (FOSAMAX) 70 MG tablet Take 1 tablet (70 mg total) by mouth once a week. Take with a full glass of water on an empty stomach.  Marland Kitchen aspirin EC 81 MG tablet Take 81 mg daily by mouth.  . carvedilol (COREG) 12.5 MG tablet Take 1 tablet (12.5 mg total) by mouth 2 (two) times daily with a meal.  . Cholecalciferol 25 MCG (1000 UT) tablet Take 1,000 Units by mouth daily.  . diclofenac sodium (VOLTAREN) 1 % GEL Apply 4 g topically 4 (four) times daily.  . diphenhydramine-acetaminophen (TYLENOL PM) 25-500 MG TABS tablet Take 2 tablets by mouth at bedtime.  . fluticasone (FLONASE) 50 MCG/ACT nasal spray Place 1 spray into both nostrils daily.  . furosemide (LASIX) 20 MG tablet Take 1 tablet (20 mg total) by mouth daily as needed for fluid or edema.  Marland Kitchen MELATONIN PO Take 1 tablet by mouth at bedtime.  . methimazole (TAPAZOLE) 5 MG tablet Take 3 tablets (15 mg total) by mouth daily.  . mometasone-formoterol (DULERA) 200-5 MCG/ACT AERO Inhale 2 puffs into the lungs 2 (two) times daily.  . pantoprazole (PROTONIX) 40 MG tablet Take 1 tablet (40 mg total) by mouth daily.  . rosuvastatin (CRESTOR) 40 MG tablet Take 1 tablet (40 mg total) by mouth daily.  . sacubitril-valsartan (ENTRESTO) 49-51 MG Take 0.5 tablets by mouth 2 (two) times daily.  Marland Kitchen senna-docusate (SENOKOT-S) 8.6-50 MG tablet Take 2 tablets at bedtime by mouth.   . tamoxifen (NOLVADEX) 20 MG tablet Take 1 tablet by mouth  once daily  . traMADol (ULTRAM) 50 MG tablet Take 1 tablet (50 mg total) by mouth 3 (three) times daily.  Marland Kitchen trolamine salicylate (ASPERCREME) 10 % cream Apply 1 application topically as needed for muscle pain.  . valACYclovir (VALTREX) 500 MG tablet Take 1 tablet (500 mg total) by mouth 2 (two) times daily. For 3 days when outbreaks.  Marland Kitchen warfarin (COUMADIN) 3 MG tablet TAKE AS DIRECTED BY ANTICOAGULATION CLINIC (Patient taking differently: TAKE AS DIRECTED BY  ANTICOAGULATION  CLINIC)    Allergies: Lisinopril, Tetanus toxoid adsorbed, and Other  Social History   Tobacco Use  . Smoking status: Former Smoker    Packs/day: 0.25    Years: 10.00    Pack years: 2.50    Types: Cigarettes    Quit date: 03/01/2010    Years since quitting: 10.0  . Smokeless tobacco: Never Used  Vaping Use  . Vaping Use: Never used  Substance Use Topics  . Alcohol use: No  . Drug use: No    Family History  Problem Relation Age of Onset  . Diabetes Mother   . Heart attack Mother 71  . Diabetes Father   . Lung cancer Father   . Diabetes Sister   . Thyroid disease Sister   . Diabetes Sister   . HIV Brother     Review of Systems: A 12-system  review of systems was performed and was negative except as noted in the HPI.  --------------------------------------------------------------------------------------------------  Physical Exam: BP 110/74 (BP Location: Right Arm, Patient Position: Sitting, Cuff Size: Large)   Pulse 79   Ht 5\' 7"  (1.702 m)   Wt 256 lb (116.1 kg)   SpO2 97%   BMI 40.10 kg/m   General: NAD.  Seated in a wheelchair. Neck: No JVD or HJR, though evaluation is limited by body habitus. Lungs: Clear bilaterally without wheezes or crackles. Heart: Distant heart sounds.  Regular rate and rhythm with 1/6 systolic murmur. Abdomen: Obese but soft. Extremities: Trace bilateral pretibial edema.  Varicose veins noted, worse on the left.  EKG: Normal sinus rhythm with inferior and  anterolateral Q waves.  No significant change from prior tracing on 01/11/2020.  Lab Results  Component Value Date   WBC 6.9 12/31/2019   HGB 11.1 (L) 12/31/2019   HCT 34.9 (L) 12/31/2019   MCV 96.4 12/31/2019   PLT 167 12/31/2019    Lab Results  Component Value Date   NA 139 12/31/2019   K 4.6 12/31/2019   CL 110 12/31/2019   CO2 18 (L) 12/31/2019   BUN 10 12/31/2019   CREATININE 1.09 (H) 12/31/2019   GLUCOSE 78 12/31/2019   ALT 22 12/31/2019    Lab Results  Component Value Date   CHOL 161 01/29/2019   HDL 64.90 01/29/2019   LDLCALC 81 01/29/2019   TRIG 77.0 01/29/2019   CHOLHDL 2 01/29/2019    --------------------------------------------------------------------------------------------------  ASSESSMENT AND PLAN: Chronic HFrEF: Despite reinitiation of Entresto and furosemide, Danielle [name] continues to have significant shortness of breath (NYHA class III) that limits her activities.  We have agreed to proceed with left and right heart catheterization for further assessment of her hemodynamics as well as to exclude CAD contributing to her symptoms and cardiomyopathy.  Due to her chronic anticoagulation in the setting of paroxysmal atrial fibrillation complicated by stroke, I feel that she would benefit from bridging with enoxaparin.  I will reach out to Dr. [name] to help facilitate this.  If she prefers, our anticoagulation could also help manage periprocedural anticoagulation.  In the meantime, we will continue current doses of carvedilol, furosemide, and Entresto.  Hyperkalemia in the past has precluded addition of spironolactone.  Paroxysmal atrial fibrillation: Danielle [name] notes a single episode of palpitations lasting about a minute since our last visit.  She remains in sinus rhythm today.  We will defer ambulatory monitoring unless she has recurrent symptoms.  We will continue current doses of carvedilol and warfarin.  History of aortic valve  replacement: Appropriate function of bioprosthetic valve noted on most recent echo in September (mean gradient 10 mmHg).  Continue warfarin and aspirin in the setting of PAF, as well as SBE prophylaxis.  As outlined above, we will proceed with right and left heart catheterization.  Hypertension: Blood pressure well controlled today.  No medication changes at this time.  Hyperlipidemia: LDL reasonable at 81 on last check.  We will need to consider escalation of rosuvastatin if there is evidence of ASCVD on upcoming catheterization.  Follow-up: Return to clinic shortly after catheterization.  [name], MD 02/27/2020 8:33 AM

## 2020-02-25 NOTE — H&P (View-Only) (Signed)
Follow-up Outpatient Visit Date: 02/27/2020  Primary Care Provider: [place], [name], MD [address] [place] [address]  Chief Complaint: Shortness of breath  HPI:  Danielle [name] is a 74 y.o. female with history of aortic valve disease (severe regurgitation by her description) s/p bioprosthetic aortic valve replacement in [place] in 03/2014, NICM with LVEF as low as 30-35% by report in 03/2015, paroxysmal atrial fibrillation on chronic warfarin, stroke x 2, hyperlipidemia, hypertension, hyperthyroidism,left breast cancer,spontaneous pneumothorax, and left hip fracture, who presents for follow-up of chronic HFrEF, paroxysmal atrial fibrillation, and valvular heart disease.  I last saw her in mid November, at which time she complained that her breathing was not good in the setting of COVID-19 infection a month earlier.  She appeared mildly volume overloaded on examination, prompting [place] to start furosemide 20 mg daily until her leg swelling had resolved.  Low-dose Sherryll Burger was restarted, as it had been held during her hospitalization with COVID-19 due to soft blood pressure.  A, the patient reports that she continues to get short of breath with mild activity, no different than at our last visit.  She also notes frequent swelling of her left leg and is currently undergoing evaluation by vascular surgery for venous insufficiency.  She has been using compression stockings with some improvement.  She denies chest pain.  She reports one episode of palpitations during which it felt as though her heart was racing.  There was associated diaphoresis.  This lasted for about a minute.  Her family checked her blood pressure and heart rate and told her that both seem to be okay.  She denies lightheadedness and dizziness.  She is trying to work with physical therapy but is limited by her shortness of breath.  She remains compliant with her medications, including furosemide and Entresto that were  restarted at our last visit.  She has not had any bleeding, remaining on warfarin that is managed through Dr. Elvis Coil office.Marland Kitchen  --------------------------------------------------------------------------------------------------  Cardiovascular History & Procedures: Cardiovascular Problems:  Aortic valve disease status post bioprosthetic AVR in 03/2014  Non-ischemic cardiomyopathy  Paroxysmal atrial fibrillation  Stroke  Risk Factors:  Hypertension, hyperlipidemia, stroke, and age > 64  Cath/PCI:  R/LHC (02/12/2014, [name]): Moderate proximal LAD calcification with mild, nonobstructive disease. Mildly reduced LVEF with severe apical hypokinesis. Normal pulmonary artery pressure.  CV Surgery:  Bioprosthetic aortic valve replacement (03/12/14, [place], [place], [place])  EP Procedures and Devices:  None  Non-Invasive Evaluation(s):  TTE (11/21/2019): Normal LV size with mild LVH.  LVEF 45-50% with global hypokinesis.  Normal RV size and function.  Bioprosthetic aortic valve present with appropriate function (mean gradient 10 mmHg).  TTE (01/18/2019): Normal LV size with LVEF 35-40%. Mild LVH. Grade 1 diastolic dysfunction. Is in function. Bioprosthetic aortic valve with normal function. Normal PA pressure.  TTE (04/21/16): Normal obese size with moderate LVH. LVEF 35-40% with mid and apical anteroseptal hypokinesis. Grade 3 diastolic dysfunction noted. Aortic valve bioprosthesis present with a mean gradient of 11 mmHg. Mitral annular calcification noted. Normal RV size and function. Mild right atrial enlargement.  TTE (03/27/15, OSH): Mild LVH with LVEF 30-35%, mild left atrial enlargement, AVR in place without regurgitation.  TTE (05/10/14, OSH): LVEF 45-50%.   Recent CV Pertinent Labs: Lab Results  Component Value Date   CHOL 161 01/29/2019   HDL 64.90 01/29/2019   LDLCALC 81 01/29/2019   TRIG 77.0 01/29/2019   CHOLHDL 2 01/29/2019   INR 2.6  02/18/2020   INR  2.8 (H) 12/22/2019   BNP 93.2 12/21/2019   K 4.6 12/31/2019   K 4.3 12/22/2016   MG 1.9 12/22/2019   BUN 10 12/31/2019   BUN 14 06/28/2019   BUN 19.5 12/22/2016   CREATININE 1.09 (H) 12/31/2019   CREATININE 1.2 (H) 12/22/2016    Past medical and surgical history were reviewed and updated in EPIC.  Current Meds  Medication Sig  . albuterol (PROVENTIL HFA;VENTOLIN HFA) 108 (90 Base) MCG/ACT inhaler Inhale 2 puffs into the lungs every 6 (six) hours as needed for wheezing or shortness of breath.   . alendronate (FOSAMAX) 70 MG tablet Take 1 tablet (70 mg total) by mouth once a week. Take with a full glass of water on an empty stomach.  . aspirin EC 81 MG tablet Take 81 mg daily by mouth.  . carvedilol (COREG) 12.5 MG tablet Take 1 tablet (12.5 mg total) by mouth 2 (two) times daily with a meal.  . Cholecalciferol 25 MCG (1000 UT) tablet Take 1,000 Units by mouth daily.  . diclofenac sodium (VOLTAREN) 1 % GEL Apply 4 g topically 4 (four) times daily.  . diphenhydramine-acetaminophen (TYLENOL PM) 25-500 MG TABS tablet Take 2 tablets by mouth at bedtime.  . fluticasone (FLONASE) 50 MCG/ACT nasal spray Place 1 spray into both nostrils daily.  . furosemide (LASIX) 20 MG tablet Take 1 tablet (20 mg total) by mouth daily as needed for fluid or edema.  . MELATONIN PO Take 1 tablet by mouth at bedtime.  . methimazole (TAPAZOLE) 5 MG tablet Take 3 tablets (15 mg total) by mouth daily.  . mometasone-formoterol (DULERA) 200-5 MCG/ACT AERO Inhale 2 puffs into the lungs 2 (two) times daily.  . pantoprazole (PROTONIX) 40 MG tablet Take 1 tablet (40 mg total) by mouth daily.  . rosuvastatin (CRESTOR) 40 MG tablet Take 1 tablet (40 mg total) by mouth daily.  . sacubitril-valsartan (ENTRESTO) 49-51 MG Take 0.5 tablets by mouth 2 (two) times daily.  . senna-docusate (SENOKOT-S) 8.6-50 MG tablet Take 2 tablets at bedtime by mouth.   . tamoxifen (NOLVADEX) 20 MG tablet Take 1 tablet by mouth  once daily  . traMADol (ULTRAM) 50 MG tablet Take 1 tablet (50 mg total) by mouth 3 (three) times daily.  . trolamine salicylate (ASPERCREME) 10 % cream Apply 1 application topically as needed for muscle pain.  . valACYclovir (VALTREX) 500 MG tablet Take 1 tablet (500 mg total) by mouth 2 (two) times daily. For 3 days when outbreaks.  . warfarin (COUMADIN) 3 MG tablet TAKE AS DIRECTED BY ANTICOAGULATION CLINIC (Patient taking differently: TAKE AS DIRECTED BY  ANTICOAGULATION  CLINIC)    Allergies: Lisinopril, Tetanus toxoid adsorbed, and Other  Social History   Tobacco Use  . Smoking status: Former Smoker    Packs/day: 0.25    Years: 10.00    Pack years: 2.50    Types: Cigarettes    Quit date: 03/01/2010    Years since quitting: 10.0  . Smokeless tobacco: Never Used  Vaping Use  . Vaping Use: Never used  Substance Use Topics  . Alcohol use: No  . Drug use: No    Family History  Problem Relation Age of Onset  . Diabetes Mother   . Heart attack Mother 66  . Diabetes Father   . Lung cancer Father   . Diabetes Sister   . Thyroid disease Sister   . Diabetes Sister   . HIV Brother     Review of Systems: A 12-system   review of systems was performed and was negative except as noted in the HPI.  --------------------------------------------------------------------------------------------------  Physical Exam: BP 110/74 (BP Location: Right Arm, Patient Position: Sitting, Cuff Size: Large)   Pulse 79   Ht 5\' 7"  (1.702 m)   Wt 256 lb (116.1 kg)   SpO2 97%   BMI 40.10 kg/m   General: NAD.  Seated in a wheelchair. Neck: No JVD or HJR, though evaluation is limited by body habitus. Lungs: Clear bilaterally without wheezes or crackles. Heart: Distant heart sounds.  Regular rate and rhythm with 1/6 systolic murmur. Abdomen: Obese but soft. Extremities: Trace bilateral pretibial edema.  Varicose veins noted, worse on the left.  EKG: Normal sinus rhythm with inferior and  anterolateral Q waves.  No significant change from prior tracing on 01/11/2020.  Lab Results  Component Value Date   WBC 6.9 12/31/2019   HGB 11.1 (L) 12/31/2019   HCT 34.9 (L) 12/31/2019   MCV 96.4 12/31/2019   PLT 167 12/31/2019    Lab Results  Component Value Date   NA 139 12/31/2019   K 4.6 12/31/2019   CL 110 12/31/2019   CO2 18 (L) 12/31/2019   BUN 10 12/31/2019   CREATININE 1.09 (H) 12/31/2019   GLUCOSE 78 12/31/2019   ALT 22 12/31/2019    Lab Results  Component Value Date   CHOL 161 01/29/2019   HDL 64.90 01/29/2019   LDLCALC 81 01/29/2019   TRIG 77.0 01/29/2019   CHOLHDL 2 01/29/2019    --------------------------------------------------------------------------------------------------  ASSESSMENT AND PLAN: Chronic HFrEF: Despite reinitiation of Entresto and furosemide, Ms. Bajo continues to have significant shortness of breath (NYHA class III) that limits her activities.  We have agreed to proceed with left and right heart catheterization for further assessment of her hemodynamics as well as to exclude CAD contributing to her symptoms and cardiomyopathy.  Due to her chronic anticoagulation in the setting of paroxysmal atrial fibrillation complicated by stroke, I feel that she would benefit from bridging with enoxaparin.  I will reach out to Dr. [name] to help facilitate this.  If she prefers, our anticoagulation could also help manage periprocedural anticoagulation.  In the meantime, we will continue current doses of carvedilol, furosemide, and Entresto.  Hyperkalemia in the past has precluded addition of spironolactone.  Paroxysmal atrial fibrillation: Ms. Pent notes a single episode of palpitations lasting about a minute since our last visit.  She remains in sinus rhythm today.  We will defer ambulatory monitoring unless she has recurrent symptoms.  We will continue current doses of carvedilol and warfarin.  History of aortic valve  replacement: Appropriate function of bioprosthetic valve noted on most recent echo in September (mean gradient 10 mmHg).  Continue warfarin and aspirin in the setting of PAF, as well as SBE prophylaxis.  As outlined above, we will proceed with right and left heart catheterization.  Hypertension: Blood pressure well controlled today.  No medication changes at this time.  Hyperlipidemia: LDL reasonable at 81 on last check.  We will need to consider escalation of rosuvastatin if there is evidence of ASCVD on upcoming catheterization.  Follow-up: Return to clinic shortly after catheterization.  Danielle Bush, MD 02/27/2020 8:33 AM

## 2020-02-27 ENCOUNTER — Telehealth: Payer: Self-pay | Admitting: Internal Medicine

## 2020-02-27 ENCOUNTER — Telehealth: Payer: Self-pay | Admitting: *Deleted

## 2020-02-27 ENCOUNTER — Encounter: Payer: Self-pay | Admitting: Internal Medicine

## 2020-02-27 ENCOUNTER — Other Ambulatory Visit: Payer: Self-pay

## 2020-02-27 ENCOUNTER — Ambulatory Visit (INDEPENDENT_AMBULATORY_CARE_PROVIDER_SITE_OTHER): Payer: Medicare Other | Admitting: Internal Medicine

## 2020-02-27 VITALS — BP 110/74 | HR 79 | Ht 67.0 in | Wt 256.0 lb

## 2020-02-27 DIAGNOSIS — I5022 Chronic systolic (congestive) heart failure: Secondary | ICD-10-CM | POA: Diagnosis not present

## 2020-02-27 DIAGNOSIS — Z952 Presence of prosthetic heart valve: Secondary | ICD-10-CM | POA: Diagnosis not present

## 2020-02-27 DIAGNOSIS — I2583 Coronary atherosclerosis due to lipid rich plaque: Secondary | ICD-10-CM | POA: Diagnosis not present

## 2020-02-27 DIAGNOSIS — I10 Essential (primary) hypertension: Secondary | ICD-10-CM

## 2020-02-27 DIAGNOSIS — E782 Mixed hyperlipidemia: Secondary | ICD-10-CM

## 2020-02-27 DIAGNOSIS — I480 Paroxysmal atrial fibrillation: Secondary | ICD-10-CM

## 2020-02-27 DIAGNOSIS — I2510 Atherosclerotic heart disease of native coronary artery without angina pectoris: Secondary | ICD-10-CM

## 2020-02-27 NOTE — Telephone Encounter (Signed)
Attempted to call back, no answer. LMOM TCB.

## 2020-02-27 NOTE — Patient Instructions (Addendum)
Medication Instructions:  Your physician recommends that you continue on your current medications as directed. Please refer to the Current Medication list given to you today.  *If you need a refill on your cardiac medications before your next appointment, please call your pharmacy*   Lab Work:  Your physician recommends that you have lab work TODAY:  Bmet, CBC  If you have labs (blood work) drawn today and your tests are completely normal, you will receive your results only by:  MyChart Message (if you have MyChart) OR  A paper copy in the mail If you have any lab test that is abnormal or we need to change your treatment, we will call you to review the results.   Testing/Procedures:    [address], [place] [address] Dept: [phone] Loc: [phone]  Shontaye Oyama  02/27/2020  You are scheduled for a RIght and Left Cardiac Catheterization on Monday, January 10 with Dr. Harrell Gave End.  1. Please arrive at the [facility] (Main Entrance A) at [facility]: [place],  [id number] at 5:30 AM (This time is two hours before your procedure to ensure your preparation). Free valet parking service is available.   Special note: Every effort is made to have your procedure done on time. Please understand that emergencies sometimes delay scheduled procedures.  2. Diet: Do not eat solid foods after midnight.  The patient may have clear liquids until 5am upon the day of the procedure.  3. Labs: You will need to have blood drawn today for Bmet and CBC. We will contact you regarding lab work needed for PT/INR since you are on Coumadin. We will contact you for further instructions regarding when to hold your Coumadin and instructions for bridging with Lovenox.   4. Medication instructions in preparation for your procedure:   Contrast  Allergy: No  We will contact you for further instructions regarding when to hold your Coumadin and instructions for bridging with Lovenox.   Stop taking, Lasix (Furosemide)  Monday, January 10, - HOLD morning of procedure   On the morning of your procedure, take your Aspirin and any morning medicines NOT listed above.  You may use sips of water.  5. Plan for one night stay--bring personal belongings. 6. Bring a current list of your medications and current insurance cards. 7. You MUST have a responsible person to drive you home. 8. Someone MUST be with you the first 24 hours after you arrive home or your discharge will be delayed. 9. Please wear clothes that are easy to get on and off and wear slip-on shoes.  Thank you for allowing [place] to care for you!   -- [facility]   COVID PRE- TEST: You will need a COVID TEST prior to the procedure:  LOCATION: [facility]-Op Admission Drive-Thru Testing site.  DATE/TIME:  Friday 03/07/20 (anytime between 8 am and 1 pm)      Follow-Up: At [facility], you and your health needs are our priority.  As part of our continuing mission to provide you with exceptional heart care, we have created designated Provider Care Teams.  These Care Teams include your primary Cardiologist (physician) and Advanced Practice Providers (APPs -  Physician Assistants and Nurse Practitioners) who all work together to provide you with the care you need, when you need it.  We recommend signing up for the patient portal called "MyChart".  Sign up information is provided on this After Visit Summary.  MyChart is used to connect with patients for Virtual Visits (Telemedicine).  Patients are able to view lab/test results, encounter notes, upcoming appointments, etc.  Non-urgent messages can be sent to your provider as well.   To learn more about what you can do with MyChart, go to [email or web address].    Your next appointment:   1 -  2 week(s) after cardiac cath procedure  The format for your next appointment:   In Person  Provider:   You may see [name], MD or one of the following Advanced Practice Providers on your designated Care Team:    Nicolasa Ducking, NP  Eula Listen, PA-C  Marisue Ivan, PA-C  Cadence Verdigris, [place]  Gillian Shields, NP

## 2020-02-27 NOTE — Telephone Encounter (Signed)
Patient returning call.

## 2020-02-27 NOTE — Telephone Encounter (Signed)
Attempted to call pt and pt's emergency contact in chart, no answer. LMOM TCB.

## 2020-02-27 NOTE — Telephone Encounter (Signed)
Called pt to discuss details of Covid pre-procedure testing prior to cardiac cath that was scheduled for 03/10/20. Reviewed cath instructions in clinic but did not give Covid test details. Asked pt to call our office back. LMOM TCB.   When pt returns call, notify pt that we have scheduled as below:   COVID PRE- TEST: You will need a COVID TEST prior to the procedure:  LOCATION: [facility] Testing site.  DATE/TIME:  Friday 03/07/20 (anytime between 8 am and 1 pm)

## 2020-02-27 NOTE — Telephone Encounter (Signed)
Called patient and left a message for patient to call back.

## 2020-02-27 NOTE — Telephone Encounter (Signed)
Patient would like to know Dr. Serita Kyle recommendation on getting the booster. Please call to advise.

## 2020-02-28 ENCOUNTER — Ambulatory Visit: Payer: Medicare Other | Admitting: Physical Therapy

## 2020-02-28 ENCOUNTER — Encounter: Payer: Self-pay | Admitting: Internal Medicine

## 2020-02-28 ENCOUNTER — Encounter: Payer: Self-pay | Admitting: Physical Therapy

## 2020-02-28 DIAGNOSIS — R2689 Other abnormalities of gait and mobility: Secondary | ICD-10-CM

## 2020-02-28 DIAGNOSIS — M25561 Pain in right knee: Secondary | ICD-10-CM | POA: Diagnosis not present

## 2020-02-28 DIAGNOSIS — M25562 Pain in left knee: Secondary | ICD-10-CM | POA: Diagnosis not present

## 2020-02-28 DIAGNOSIS — G8929 Other chronic pain: Secondary | ICD-10-CM | POA: Diagnosis not present

## 2020-02-28 DIAGNOSIS — Z7409 Other reduced mobility: Secondary | ICD-10-CM | POA: Diagnosis not present

## 2020-02-28 DIAGNOSIS — R293 Abnormal posture: Secondary | ICD-10-CM | POA: Diagnosis not present

## 2020-02-28 DIAGNOSIS — M6281 Muscle weakness (generalized): Secondary | ICD-10-CM | POA: Diagnosis not present

## 2020-02-28 DIAGNOSIS — R262 Difficulty in walking, not elsewhere classified: Secondary | ICD-10-CM | POA: Diagnosis not present

## 2020-02-28 LAB — BASIC METABOLIC PANEL
BUN/Creatinine Ratio: 10 — ABNORMAL LOW (ref 12–28)
BUN: 11 mg/dL (ref 8–27)
CO2: 17 mmol/L — ABNORMAL LOW (ref 20–29)
Calcium: 9.3 mg/dL (ref 8.7–10.3)
Chloride: 110 mmol/L — ABNORMAL HIGH (ref 96–106)
Creatinine, Ser: 1.1 mg/dL — ABNORMAL HIGH (ref 0.57–1.00)
GFR calc Af Amer: 57 mL/min/{1.73_m2} — ABNORMAL LOW (ref >59)
GFR calc non Af Amer: 50 mL/min/{1.73_m2} — ABNORMAL LOW (ref >59)
Glucose: 92 mg/dL (ref 65–99)
Potassium: 4.8 mmol/L (ref 3.5–5.2)
Sodium: 142 mmol/L (ref 134–144)

## 2020-02-28 LAB — CBC
Hematocrit: 38.2 % (ref 34.0–46.6)
Hemoglobin: 12.4 g/dL (ref 11.1–15.9)
MCH: 30.4 pg (ref 26.6–33.0)
MCHC: 32.5 g/dL (ref 31.5–35.7)
MCV: 94 fL (ref 79–97)
Platelets: 111 10*3/uL — ABNORMAL LOW (ref 150–450)
RBC: 4.08 x10E6/uL (ref 3.77–5.28)
RDW: 14.1 % (ref 11.7–15.4)
WBC: 5.1 10*3/uL (ref 3.4–10.8)

## 2020-02-28 NOTE — Telephone Encounter (Signed)
Patient called in to inquire if she could go ahead and get her COVID booster shot done. She had her last shot back in March. Recommended that she have that booster vaccine and that I would make Dr. Okey Dupre aware. She verbalized understanding with no further questions at this time.

## 2020-02-28 NOTE — Telephone Encounter (Signed)
Left detailed voicemail message to please call back regarding need for COVID testing prior to her procedure.

## 2020-02-28 NOTE — Telephone Encounter (Signed)
Attempted to call pt again. LMOM TCB.

## 2020-02-28 NOTE — Therapy (Addendum)
[place], [place], [address] Phone: [phone]   Fax:  [phone]  Physical Therapy Treatment/Discharge  Patient Details  Name: Danielle [name] MRN: [id number] Date of Birth: 11-11-1945 Referring Provider (PT): [name], MD   Encounter Date: 02/28/2020   PT End of Session - 02/28/20 0815    Visit Number 3    Number of Visits 16    Date for PT Re-Evaluation 04/08/20    Authorization Type [employer] Medicare    Progress Note Due on Visit 10    PT Start Time 0800    PT Stop Time 0845    PT Time Calculation (min) 45 min           Past Medical History:  Diagnosis Date  . Allergy   . Anxiety   . Aortic stenosis    Status post bioprosthetic AVR  . Arthritis    DJD  . Asthma   . Breast cancer ([place]) 12/13/2016   Left breast  . Chronic systolic CHF (congestive heart failure) ([place])    EF 30% 04/2014  . COPD (chronic obstructive pulmonary disease) ([place])   . Coronary artery disease    per pt, had LHC prior to AVR in [place] that did not show any blockages; no stents/bypass  . Gastritis   . Hyperlipidemia   . Hypertension   . Hyperthyroidism   . Multiple thyroid nodules   . Osteoporosis   . Paroxysmal atrial fibrillation (H[facility])   . Pelvis fracture ([place]) 08/19/2015   MULTIPLE   . Personal history of radiation therapy 2018  . Spontaneous pneumothorax 11/28/2015   left   . Stroke [name]) 2000   rt hand weak    Past Surgical History:  Procedure Laterality Date  . BREAST LUMPECTOMY Left 12/2016  . BREAST LUMPECTOMY WITH RADIOACTIVE SEED AND SENTINEL LYMPH NODE BIOPSY Left 01/14/2017   Procedure: LEFT BREAST LUMPECTOMY WITH RADIOACTIVE SEED AND SENTINEL LYMPH NODE BIOPSY;  Surgeon: Rolm Bookbinder, MD;  Location: [facility];  Service: General;  Laterality: Left;  . CHEST TUBE INSERTION  10/2015  . INTRAMEDULLARY (IM) NAIL INTERTROCHANTERIC Left 12/10/2015   Procedure: INTRAMEDULLARY  (IM) NAIL INTERTROCHANTRIC;  Surgeon: Leandrew Koyanagi, MD;  Location: [facility];  Service: Orthopedics;  Laterality: Left;  . PLEURADESIS Left 12/03/2015   Procedure: PLEURADESIS;  Surgeon: Melrose Nakayama, MD;  Location: [facility];  Service: Thoracic;  Laterality: Left;  . RESECTION OF APICAL BLEB Left 12/03/2015   Procedure: BLEBECTOMY;  Surgeon: Melrose Nakayama, MD;  Location: [facility];  Service: Thoracic;  Laterality: Left;  . THORACOSCOPY  12/03/2015  . VALVE REPLACEMENT    . VIDEO ASSISTED THORACOSCOPY Left 12/03/2015   Procedure: VIDEO ASSISTED THORACOSCOPY;  Surgeon: Melrose Nakayama, MD;  Location: [facility];  Service: Thoracic;  Laterality: Left;    There were no vitals filed for this visit.   Subjective Assessment - 02/28/20 0814    Subjective Left hip and low back 7/10. I am having a cardiac cath on Jan10th. MD said I could still come to this appointment. I will wait until after the procedure to schedule more appt in PT.    Currently in Pain? Yes    Pain Score 7     Pain Location Back    Pain Orientation Left    Pain Descriptors / Indicators Aching    Pain Type Chronic pain    Pain Radiating Towards left hip    Aggravating Factors  standing and walking  Pain Relieving Factors sitting              [facility] PT Assessment - 02/28/20 0001      Observation/Other Assessments   Focus on Therapeutic Outcomes (FOTO)  41% on third visit status      Transfers   Five time sit to stand comments  32.5 sec      Standardized Balance Assessment   Standardized Balance Assessment Timed Up and Go Test      Timed Up and Go Test   Normal TUG (seconds) 48.8                         [facility] Adult PT Treatment/Exercise - 02/28/20 0001      Ambulation/Gait   Ambulation/Gait Yes    Ambulation/Gait Assistance 6: Modified independent (Device/Increase time)    Assistive device 4-wheeled walker    Gait Pattern Decreased step length - left;Decreased step length - right;Decreased  dorsiflexion - right;Decreased dorsiflexion - left;Lateral hip instability;Trunk flexed    Gait velocity slow pace    Gait Comments DOE walking into and out of gym from the waiting area      Knee/Hip Exercises: Seated   Long Arc Quad Right;Left;2 sets;10 reps    [facility] 10x2    Clamshell with TheraBand Green   L and R, 10x2   Marching Right;Left;2 sets;10 reps    Hamstring Curl 2 sets;10 reps   red band at ankle   Sit to Sand 5 reps   use of hands, 2 sets                   PT Short Term Goals - 02/28/20 9373      PT SHORT TERM GOAL #1   Title Independent with HEP    Baseline met    Time 3    Status Achieved      PT SHORT TERM GOAL #2   Title Baseline TUG and create LTG.    Baseline TUG 48.8 sec, need PT to set goal    Time 3    Period Weeks    Status Partially Met      PT SHORT TERM GOAL #3   Title Pt will improve 5XSTS to <40s to demonstrated improved LE strength and balance.    Baseline 32.5 sec today    Time 4    Period Weeks    Status Achieved    Target Date 03/11/20             PT Long Term Goals - 02/12/20 0915      PT LONG TERM GOAL #1   Title Pt will be independent with long term HEP for maintenance and continued progression of functional mobility.    Time 8    Period Weeks    Status New    Target Date 04/08/20      PT LONG TERM GOAL #2   Title Pt will improve 5XSTS to <30s to progress toward age related normal of 12.6s for improved dynamic balance and functional mobility.    Baseline 59s    Time 8    Period Weeks    Status New    Target Date 04/08/20      PT LONG TERM GOAL #3   Title Pt will improve FOTO outcome measure score to 52% or greater ability to demo significant change in perceived functional ability.    Baseline 41% ability    Time 8  Period Weeks    Status New    Target Date 04/08/20      PT LONG TERM GOAL #4   Title Pt will ambulate 250 ft or greater MOD I with LRAD for improved endurance and functional  mobility.    Baseline OOB after 50 feet with 4 wheeled walker    Time 8    Period Weeks    Status New    Target Date 04/08/20      PT LONG TERM GOAL #5   Title Pt will gross LE MMT to 4+/5 for improved strength.    Time 8    Period Weeks    Status New    Target Date 04/08/20                 Plan - 02/28/20 1093    Clinical Impression Statement 5 x STS improved from 59 sec to 32.5 seconds. TUG assessed at 48.8 sec with Rollator. Reviewed HEP and added hamstring curls with resistance. FOTO score unchanged. Pt is independent with current HEP and has a seated cycle at home. Also walks indoors for conditioning. She is quick to become SOB however her oxygen saturation remains at 95% or more. She is scheduled to have cardiac cath on Jan 10th. SHe will perform her HEP until that procedure. See updated progress toward goals. Pending results she may resume  PT. She was educated on the need for new referral if she has a change in medical status.    PT Next Visit Plan DId she have cardiac cath and did she have a change in medical status?  Assess response to HEP and update PRN, TUG test, progress endurance and strength for improved functional mobility    PT Home Exercise Plan [id number]: LAQ, seated/supine march, SLR, clams, sit to stands. HEP to complete exs either in supine or sitting.           Patient will benefit from skilled therapeutic intervention in order to improve the following deficits and impairments:  Difficulty walking,Decreased mobility,Decreased endurance,Decreased balance,Cardiopulmonary status limiting activity,Hypomobility,Obesity,Improper body mechanics,Impaired perceived functional ability,Pain,Postural dysfunction,Decreased strength,Decreased activity tolerance  Visit Diagnosis: Peripheral musculoskeletal gait disorder  Abnormal posture  Difficulty in walking, not elsewhere classified  Muscle weakness (generalized)  Decreased functional mobility and  endurance     Problem List Patient Active Problem List   Diagnosis Date Noted  . Pneumonia due to COVID-19 virus   . Bacteremia due to Gram-positive bacteria 08/29/2018  . Abscess   . Cellulitis of left breast 08/28/2018  . Cellulitis of left abdominal wall 08/28/2018  . Encounter for routine gynecological examination 04/28/2018  . CKD (chronic kidney disease), stage III ([place]) 09/27/2017  . Recurrent genital herpes 04/13/2017  . Chronic pain disorder 01/25/2017  . Malignant neoplasm of upper-outer quadrant of left breast in female, estrogen receptor positive ([place]) 12/16/2016  . Chronic knee pain 12/07/2016  . Chronic bilateral low back pain without sciatica 12/07/2016  . Peripheral musculoskeletal gait disorder 12/07/2016  . Encounter for therapeutic drug monitoring 06/16/2016  . Generalized osteoarthritis of multiple sites 05/04/2016  . Chronic anticoagulation 05/04/2016  . Stroke ([place]) 04/14/2016  . Aortic valve disease 04/10/2016  . S/P AVR 04/10/2016  . NICM (nonischemic cardiomyopathy) ([place]) 04/10/2016  . Upper airway cough syndrome 03/14/2016  . Morbid obesity ([place]) 03/14/2016  . COPD (chronic obstructive pulmonary disease) ([place]) 03/11/2016  . Thrombocytopenia ([place]) 12/12/2015  . Anemia 12/12/2015  . Vitamin D deficiency 12/12/2015  . Acute urinary retention 12/11/2015  .  Osteoporosis 12/11/2015  . Hip fracture ([place]) 12/10/2015  . Aortic atherosclerosis ([place]) 12/10/2015  . Lung blebs ([place]) 12/03/2015  . Pelvic fracture ([place]) 08/19/2015  . Fall at home 08/19/2015  . Essential hypertension 08/19/2015  . Mixed hyperlipidemia 08/19/2015  . Asthma 08/19/2015  . Paroxysmal atrial fibrillation ([place]) 08/19/2015  . Hyperthyroidism 08/19/2015  . Chronic HFrEF (heart failure with reduced ejection fraction) ([place])   . Coronary artery disease   . Aortic stenosis     Dorene Ar, PTA 02/28/2020, 10:50 AM  [facility] [address] [place], [place], [address] Phone: [phone]   Fax:  [phone]  Name: Anda Sobotta MRN: [id number] Date of Birth: 01-27-1946   PHYSICAL THERAPY DISCHARGE SUMMARY  Visits from Start of Care: 3  Current functional level related to goals / functional outcomes: unknown   Remaining deficits: unknown   Education / Equipment: HEP  Plan: Patient agrees to discharge.  Patient goals were not met. Patient is being discharged due to not returning since the last visit.  ?????        Phill Myron. Yvette Rack, PT, DPT

## 2020-02-28 NOTE — Telephone Encounter (Signed)
Spoke with patient and reviewed need for COVID swab to be done on Friday January 7 th between 8 AM and 1 PM. Let her know that I also sent this information to her My Chart. She verbalized understanding with no further questions at this time.

## 2020-03-03 ENCOUNTER — Ambulatory Visit: Payer: Medicare Other | Admitting: Physician Assistant

## 2020-03-04 ENCOUNTER — Telehealth: Payer: Self-pay | Admitting: *Deleted

## 2020-03-04 NOTE — Telephone Encounter (Signed)
-----   Message from [name], MD sent at 03/04/2020 12:03 PM EST ----- Regarding: RE: Lovenox Bridge I have yet to hear anything from Dr. [place].  Victorino Dike, would you be able to reach out to Dr. Elvis Coil office to see if they are planning to assist with Lovenox bridging?  If not, would you be able to help coordinate this Aundra Millet?  Thanks for your help.  Thayer Ohm  ----- Message ----- From: Annia Belt, RN Sent: 03/04/2020  11:56 AM EST To: Jeannetta Nap, RN, [name], MD Subject: Lovenox Bridge                                 Hi Dr. Okey Dupre,  I was just following up on this pt that you saw last Wednesday regarding her Lovenox bridge prior to her upcoming cath. I know you were reaching out to Dr. Sw[place] about this. She is scheduled for the cath 03/10/20. Is there anything that I need to do? I just wanted to make sure.   Thank you,  Aundra Millet

## 2020-03-04 NOTE — Telephone Encounter (Signed)
This pt is scheduled for a right and left heart cath with Dr. Okey Dupre on Monday 03/10/20.  She is on Warfarin for a fib and looks like this is managed at your office.  Would you be able to assist with setting her up for Lovenox bridge? Please let me know and if there is anything else you need from me in the process.  Thank you!

## 2020-03-05 ENCOUNTER — Ambulatory Visit (INDEPENDENT_AMBULATORY_CARE_PROVIDER_SITE_OTHER): Payer: Medicare Other | Admitting: *Deleted

## 2020-03-05 ENCOUNTER — Other Ambulatory Visit: Payer: Self-pay

## 2020-03-05 DIAGNOSIS — Z7901 Long term (current) use of anticoagulants: Secondary | ICD-10-CM | POA: Diagnosis not present

## 2020-03-05 LAB — POCT INR: INR: 3 (ref 2.0–3.0)

## 2020-03-05 MED ORDER — ENOXAPARIN SODIUM 120 MG/0.8ML ~~LOC~~ SOLN: 120.0000 mg | Freq: Two times a day (BID) | SUBCUTANEOUS | 0 refills | Status: DC

## 2020-03-05 NOTE — Telephone Encounter (Signed)
Weight 116kg Scr 1.1 on 02/27/20 crcl 66ml/min  1/4: Last dose of warfarin.  1/5: No warfarin or enoxaparin (Lovenox).  1/6: Inject enoxaparin 120mg  in the fatty abdominal tissue at least 2 inches from the belly button twice a day about 12 hours apart, 7:30 am and 7:30 pm rotate sites. No warfarin.  1/7: Inject enoxaparin in the fatty tissue every 12 hours, 7:30am and 7:30pm. No warfarin.  1/8: Inject enoxaparin in the fatty tissue every 12 hours, 7:30 am and 7:30pm. No warfarin.  1/9: Inject enoxaparin in the fatty tissue in the morning at 7:30 am (No PM dose). No warfarin.  1/10: Procedure Day - No enoxaparin - Resume warfarin in the evening or as directed by doctor (take an extra half tablet with usual dose for 2 days then resume normal dose).  1/11: Resume enoxaparin inject in the fatty tissue every 12 hours and take warfarin  1/12: Inject enoxaparin in the fatty tissue every 12 hours and take warfarin  1/13: Inject enoxaparin in the fatty tissue every 12 hours and take warfarin  1/14: Inject enoxaparin in the fatty tissue every 12 hours and take warfarin  1/15: Inject enoxaparin in the fatty tissue every 12 hours and take warfarin  1/16: Inject enoxaparin in the fatty tissue every 12 hours and take warfarin  1/17: warfarin appt to check INR.

## 2020-03-05 NOTE — Telephone Encounter (Signed)
Have you be able to reach pt with Lovenox bridge information? Thanks for your help, BJ

## 2020-03-05 NOTE — Patient Instructions (Addendum)
Description   Please follow bridge instructions. Normal dose of warfarin: 1 tablet (3mg ) daily.  Recheck INR on 03/17/2020 with Cindy.  Please call coumadin clinic for any questions [phone].     1/4: Last dose of warfarin.  1/5: No warfarin or enoxaparin (Lovenox).  1/6: Inject enoxaparin 120mg  in the fatty abdominal tissue at least 2 inches from the belly button twice a day about 12 hours apart, 7:30 am and 7:30 pm rotate sites. No warfarin.  1/7: Inject enoxaparin in the fatty tissue every 12 hours, 7:30am and 7:30pm. No warfarin.  1/8: Inject enoxaparin in the fatty tissue every 12 hours, 7:30 am and 7:30pm. No warfarin.  1/9: Inject enoxaparin in the fatty tissue in the morning at 7:30 am (No PM dose). No warfarin.   1/10: Procedure Day - No enoxaparin - Resume warfarin in the evening or as directed by doctor (take an extra half tablet with usual dose for 2 days then resume normal dose).  1/11: Resume enoxaparin inject in the fatty tissue every 12 hours and take warfarin  1/12: Inject enoxaparin in the fatty tissue every 12 hours and take warfarin  1/13: Inject enoxaparin in the fatty tissue every 12 hours and take warfarin  1/14: Inject enoxaparin in the fatty tissue every 12 hours and take warfarin  1/15: Inject enoxaparin in the fatty tissue every 12 hours and take warfarin  1/16: Inject enoxaparin in the fatty tissue every 12 hours and take warfarin  1/17: warfarin appt to check INR.

## 2020-03-06 ENCOUNTER — Telehealth: Payer: Self-pay | Admitting: *Deleted

## 2020-03-06 NOTE — Telephone Encounter (Signed)
Yes, patient came to our office yesterday and we reviewed the bridge with her. Thanks

## 2020-03-06 NOTE — Telephone Encounter (Signed)
Pt contacted pre-catheterization scheduled at [facility] for: Monday March 07, 2020 7:30 AM Verified arrival time and place: [facility] Main Entrance [facility]) at: 5:30 AM   No solid food after midnight prior to cath, clear liquids until 5 AM day of procedure.  Hold: Warfarin-last dose 03/05/20 until post procedure/Lovenox bridge See 03/05/20 Anti-coag visit for detailed instructions-reviewed with patient. Lasix-day before and day of procedure-GFR 57-pt reports only takes prn Entresto-PM prior/AM of procedure -GFR 57  Except hold medications AM meds can be  taken pre-cath with sips of water including: ASA 81 mg   Confirmed patient has responsible adult to drive home post procedure and be with patient first 24 hours after arriving home: yes  You are allowed ONE visitor in the waiting room during the time you are at the hospital for your procedure. Both you and your visitor must wear a mask once you enter the hospital.  Reviewed procedure/mask/visitor instructions with patient.

## 2020-03-07 ENCOUNTER — Other Ambulatory Visit: Payer: Self-pay

## 2020-03-07 ENCOUNTER — Other Ambulatory Visit
Admission: RE | Admit: 2020-03-07 | Discharge: 2020-03-07 | Disposition: A | Discharge status: Home / Self Care | Payer: Medicare Other | Source: Ambulatory Visit | Attending: Internal Medicine | Admitting: Internal Medicine

## 2020-03-07 DIAGNOSIS — Z20822 Contact with and (suspected) exposure to covid-19: Secondary | ICD-10-CM | POA: Insufficient documentation

## 2020-03-07 DIAGNOSIS — Z01818 Encounter for other preprocedural examination: Secondary | ICD-10-CM | POA: Diagnosis not present

## 2020-03-07 LAB — SARS CORONAVIRUS 2 (TAT 6-24 HRS): SARS Coronavirus 2: NEGATIVE

## 2020-03-10 ENCOUNTER — Ambulatory Visit (HOSPITAL_COMMUNITY)
Admission: RE | Admit: 2020-03-10 | Discharge: 2020-03-10 | Disposition: A | Discharge status: Home / Self Care | Payer: Medicare Other | Attending: Internal Medicine | Admitting: Internal Medicine

## 2020-03-10 ENCOUNTER — Encounter (HOSPITAL_COMMUNITY): Payer: Self-pay | Admitting: Internal Medicine

## 2020-03-10 ENCOUNTER — Encounter (HOSPITAL_COMMUNITY)
Admission: RE | Disposition: A | Discharge status: Home / Self Care | Payer: Medicare Other | Source: Home / Self Care | Attending: Internal Medicine

## 2020-03-10 ENCOUNTER — Other Ambulatory Visit: Payer: Self-pay

## 2020-03-10 DIAGNOSIS — I110 Hypertensive heart disease with heart failure: Secondary | ICD-10-CM | POA: Diagnosis not present

## 2020-03-10 DIAGNOSIS — Z953 Presence of xenogenic heart valve: Secondary | ICD-10-CM | POA: Insufficient documentation

## 2020-03-10 DIAGNOSIS — Z8616 Personal history of COVID-19: Secondary | ICD-10-CM | POA: Diagnosis not present

## 2020-03-10 DIAGNOSIS — Z7901 Long term (current) use of anticoagulants: Secondary | ICD-10-CM | POA: Insufficient documentation

## 2020-03-10 DIAGNOSIS — Z87891 Personal history of nicotine dependence: Secondary | ICD-10-CM | POA: Diagnosis not present

## 2020-03-10 DIAGNOSIS — Z888 Allergy status to other drugs, medicaments and biological substances status: Secondary | ICD-10-CM | POA: Insufficient documentation

## 2020-03-10 DIAGNOSIS — Z7982 Long term (current) use of aspirin: Secondary | ICD-10-CM | POA: Diagnosis not present

## 2020-03-10 DIAGNOSIS — I480 Paroxysmal atrial fibrillation: Secondary | ICD-10-CM | POA: Diagnosis not present

## 2020-03-10 DIAGNOSIS — Z79899 Other long term (current) drug therapy: Secondary | ICD-10-CM | POA: Diagnosis not present

## 2020-03-10 DIAGNOSIS — I5022 Chronic systolic (congestive) heart failure: Secondary | ICD-10-CM | POA: Diagnosis not present

## 2020-03-10 DIAGNOSIS — E785 Hyperlipidemia, unspecified: Secondary | ICD-10-CM | POA: Insufficient documentation

## 2020-03-10 DIAGNOSIS — Z7951 Long term (current) use of inhaled steroids: Secondary | ICD-10-CM | POA: Diagnosis not present

## 2020-03-10 HISTORY — PX: RIGHT/LEFT HEART CATH AND CORONARY ANGIOGRAPHY: CATH118266

## 2020-03-10 LAB — POCT I-STAT EG7
Acid-base deficit: 2 mmol/L (ref 0.0–2.0)
Bicarbonate: 23.2 mmol/L (ref 20.0–28.0)
Calcium, Ion: 1.29 mmol/L (ref 1.15–1.40)
HCT: 34 % — ABNORMAL LOW (ref 36.0–46.0)
Hemoglobin: 11.6 g/dL — ABNORMAL LOW (ref 12.0–15.0)
O2 Saturation: 74 %
Potassium: 4 mmol/L (ref 3.5–5.1)
Sodium: 142 mmol/L (ref 135–145)
TCO2: 24 mmol/L (ref 22–32)
pCO2, Ven: 38.3 mmHg — ABNORMAL LOW (ref 44.0–60.0)
pH, Ven: 7.391 (ref 7.250–7.430)
pO2, Ven: 40 mmHg (ref 32.0–45.0)

## 2020-03-10 LAB — POCT I-STAT 7, (LYTES, BLD GAS, ICA,H+H)
Acid-base deficit: 1 mmol/L (ref 0.0–2.0)
Bicarbonate: 22.5 mmol/L (ref 20.0–28.0)
Calcium, Ion: 1.31 mmol/L (ref 1.15–1.40)
HCT: 35 % — ABNORMAL LOW (ref 36.0–46.0)
Hemoglobin: 11.9 g/dL — ABNORMAL LOW (ref 12.0–15.0)
O2 Saturation: 96 %
Potassium: 4.1 mmol/L (ref 3.5–5.1)
Sodium: 142 mmol/L (ref 135–145)
TCO2: 24 mmol/L (ref 22–32)
pCO2 arterial: 34.2 mmHg (ref 32.0–48.0)
pH, Arterial: 7.426 (ref 7.350–7.450)
pO2, Arterial: 81 mmHg — ABNORMAL LOW (ref 83.0–108.0)

## 2020-03-10 LAB — PROTIME-INR
INR: 1.2 (ref 0.8–1.2)
Prothrombin Time: 14.6 seconds (ref 11.4–15.2)

## 2020-03-10 SURGERY — RIGHT/LEFT HEART CATH AND CORONARY ANGIOGRAPHY
Anesthesia: LOCAL

## 2020-03-10 MED ORDER — LIDOCAINE HCL (PF) 1 % IJ SOLN
INTRAMUSCULAR | Status: DC | PRN
  Administered 2020-03-10 (×2): 2 mL

## 2020-03-10 MED ORDER — VERAPAMIL HCL 2.5 MG/ML IV SOLN
INTRAVENOUS | Status: DC | PRN
  Administered 2020-03-10: 10 mL via INTRA_ARTERIAL

## 2020-03-10 MED ORDER — HEPARIN SODIUM (PORCINE) 1000 UNIT/ML IJ SOLN
INTRAMUSCULAR | Status: DC | PRN
  Administered 2020-03-10: 5000 [IU] via INTRAVENOUS

## 2020-03-10 MED ORDER — MIDAZOLAM HCL 2 MG/2ML IJ SOLN
INTRAMUSCULAR | Status: DC | PRN
  Administered 2020-03-10: 1 mg via INTRAVENOUS

## 2020-03-10 MED ORDER — HEPARIN SODIUM (PORCINE) 1000 UNIT/ML IJ SOLN
INTRAMUSCULAR | Status: AC
  Filled 2020-03-10: qty 1

## 2020-03-10 MED ORDER — SODIUM CHLORIDE 0.9% FLUSH: 3.0000 mL | INTRAVENOUS | Status: DC | PRN

## 2020-03-10 MED ORDER — ASPIRIN 81 MG PO CHEW: 81.0000 mg | CHEWABLE_TABLET | ORAL | Status: DC

## 2020-03-10 MED ORDER — FENTANYL CITRATE (PF) 100 MCG/2ML IJ SOLN
INTRAMUSCULAR | Status: AC
  Filled 2020-03-10: qty 2

## 2020-03-10 MED ORDER — ACETAMINOPHEN 325 MG PO TABS: 650.0000 mg | ORAL_TABLET | ORAL | Status: DC | PRN

## 2020-03-10 MED ORDER — HYDRALAZINE HCL 20 MG/ML IJ SOLN
10.0000 mg | INTRAMUSCULAR | Status: DC | PRN
Start: 2020-03-10 — End: 2020-03-10

## 2020-03-10 MED ORDER — SODIUM CHLORIDE 0.9 % IV SOLN: INTRAVENOUS | Status: DC

## 2020-03-10 MED ORDER — HEPARIN (PORCINE) IN NACL 1000-0.9 UT/500ML-% IV SOLN
INTRAVENOUS | Status: DC | PRN
  Administered 2020-03-10 (×2): 500 mL

## 2020-03-10 MED ORDER — MIDAZOLAM HCL 2 MG/2ML IJ SOLN
INTRAMUSCULAR | Status: AC
  Filled 2020-03-10: qty 2

## 2020-03-10 MED ORDER — VERAPAMIL HCL 2.5 MG/ML IV SOLN
INTRAVENOUS | Status: AC
  Filled 2020-03-10: qty 2

## 2020-03-10 MED ORDER — SODIUM CHLORIDE 0.9% FLUSH: 3.0000 mL | Freq: Two times a day (BID) | INTRAVENOUS | Status: DC

## 2020-03-10 MED ORDER — FENTANYL CITRATE (PF) 100 MCG/2ML IJ SOLN
INTRAMUSCULAR | Status: DC | PRN
  Administered 2020-03-10: 25 ug via INTRAVENOUS

## 2020-03-10 MED ORDER — SODIUM CHLORIDE 0.9 % IV SOLN: 250.0000 mL | INTRAVENOUS | Status: DC | PRN

## 2020-03-10 MED ORDER — HEPARIN (PORCINE) IN NACL 1000-0.9 UT/500ML-% IV SOLN
INTRAVENOUS | Status: AC
  Filled 2020-03-10: qty 1000

## 2020-03-10 MED ORDER — FUROSEMIDE 20 MG PO TABS: 40.0000 mg | ORAL_TABLET | Freq: Every day | ORAL | 1 refills | Status: DC

## 2020-03-10 MED ORDER — IOHEXOL 350 MG/ML SOLN
INTRAVENOUS | Status: DC | PRN
  Administered 2020-03-10: 60 mL

## 2020-03-10 MED ORDER — LIDOCAINE HCL (PF) 1 % IJ SOLN
INTRAMUSCULAR | Status: AC
  Filled 2020-03-10: qty 30

## 2020-03-10 MED ORDER — LABETALOL HCL 5 MG/ML IV SOLN: 10.0000 mg | INTRAVENOUS | Status: DC | PRN

## 2020-03-10 MED ORDER — ONDANSETRON HCL 4 MG/2ML IJ SOLN: 4.0000 mg | Freq: Four times a day (QID) | INTRAMUSCULAR | Status: DC | PRN

## 2020-03-10 SURGICAL SUPPLY — 14 items
CATH 5FR JL3.5 JR4 ANG PIG MP (CATHETERS) ×2 IMPLANT
CATH BALLN WEDGE 5F 110CM (CATHETERS) ×2 IMPLANT
CATH INFINITI 5 FR 3DRC (CATHETERS) ×2 IMPLANT
DEVICE RAD COMP TR BAND LRG (VASCULAR PRODUCTS) ×2 IMPLANT
GLIDESHEATH SLEND SS 6F .021 (SHEATH) ×2 IMPLANT
GUIDEWIRE INQWIRE 1.5J.035X260 (WIRE) ×1 IMPLANT
INQWIRE 1.5J .035X260CM (WIRE) ×2
KIT HEART LEFT (KITS) ×2 IMPLANT
PACK CARDIAC CATHETERIZATION (CUSTOM PROCEDURE TRAY) ×2 IMPLANT
SHEATH GLIDE SLENDER 4/5FR (SHEATH) ×2 IMPLANT
TRANSDUCER W/STOPCOCK (MISCELLANEOUS) ×2 IMPLANT
TUBING CIL FLEX 10 FLL-RA (TUBING) ×2 IMPLANT
WIRE EMERALD 3MM-J .025X260CM (WIRE) ×2 IMPLANT
WIRE HI TORQ VERSACORE-J 145CM (WIRE) ×2 IMPLANT

## 2020-03-10 NOTE — Discharge Instructions (Signed)

## 2020-03-10 NOTE — Interval H&P Note (Signed)
History and Physical Interval Note:  03/10/2020 7:09 AM  [name]  has presented today for surgery, with the diagnosis of heart failure.  The various methods of treatment have been discussed with the patient and family. After consideration of risks, benefits and other options for treatment, the patient has consented to  Procedure(s): RIGHT/LEFT HEART CATH AND CORONARY ANGIOGRAPHY (N/A) as a surgical intervention.  The patient's history has been reviewed, patient examined, no change in status, stable for surgery.  I have reviewed the patient's chart and labs.  Questions were answered to the patient's satisfaction.    Cath Lab Visit (complete for each Cath Lab visit)  Clinical Evaluation Leading to the Procedure:   ACS: No.  Non-ACS:    Anginal/Heart Failure Classification: NYHA class III  Anti-ischemic medical therapy: Minimal Therapy (1 class of medications)  Non-Invasive Test Results: No non-invasive testing performed  Prior CABG: No previous CABG  Connie Hilgert

## 2020-03-13 ENCOUNTER — Other Ambulatory Visit: Payer: Self-pay | Admitting: *Deleted

## 2020-03-13 NOTE — Patient Outreach (Signed)
[facility]) Care Management  03/13/2020  Danielle [name] 04/27/1945 [id number]   Telephone Assessment-Successful (HF)  RN spoke with pt today and inquired about her ongoing management of care. Pt reports a recent heart catheterization on 03/10/2020 and reports some fluid retention with her HF(treated). Pt states her fluid medication was increased to Lasix 40 mg and she is back at her baseline of 251 lbs this AM. Reports last week at 256 lbs. No acute symptoms and pt denies any swelling however reports being fitting for compression stockings by her vascular provider if needed in the future based upon her CAD history. Pt will follow up with her CAD provided on 1/18 concerning her recent procedure. Pt is recovery well with no additional issues or needs at this time.  Based upon the recent events will incorporate HF program and discuss all goals and interventions related. Will update the existing plan of care accordingly. Will encouraged daily weights and educate on when to contact her provider with 3 lbs overnight or 5 lbs within one withl of fluid retention. Pt verbalized an understanding and will contact her cardiologist with any changes.  Will update provider accordingly and follow up next month with ongoing progress concerning pt's management of care.  Goals Addressed            This Visit's Progress   . THN-Comorbidities Identified and Managed       Timeframe:  Long-Range Goal Priority:    Medium Start Date:   03/13/2020                          Expected End Date:   06/27/2020  Follow up Date: 04/11/2020                     Evidence-based guidance:   Assess and address signs/symptoms of comorbidity, including dyslipidemia, diabetes, iron deficiency, gout, arthritis, dysrhythmia, hypertension, cachexia, coronary artery disease, kidney dysfunction and lung disease.   Prepare patient for laboratory and diagnostic exams based on risk and presentation.    Monitor side effects and anticipate need for periodic adjustments.   Prepare patient for potential invasive treatment, such as implantable cardioverter-defibrillator, cardiac resynchronization therapy or heart transplant as disease progresses.   Notes:     . THN-Track and Manage Fluids and Swelling-Heart Failure   On track    Timeframe:  Short-Term Goal Priority:  Medium Start Date:      03/13/2020                       Expected End Date:       04/28/2020                Follow Up Date 04/11/2020   - call office if I gain more than 2 pounds in one day or 5 pounds in one week - do ankle pumps when sitting - keep legs up while sitting - track weight in diary - use salt in moderation - watch for swelling in feet, ankles and legs every day - weigh myself daily    Why is this important?    It is important to check your weight daily and watch how much salt and liquids you have.   It will help you to manage your heart failure.    Notes:     . THN-Track and Manage Symptoms   On track    Follow Up Date 2/11/22022 Timeframe:  Short-Term Goal  Priority:  Medium Start Date:     03/13/2020                        Expected End Date:  04/28/2020                       - begin a heart failure diary - develop a rescue plan - eat more whole grains, fruits and vegetables, lean meats and healthy fats - follow rescue plan if symptoms flare-up - know when to call the doctor - track symptoms and what helps feel better or worse    Why is this important?   You will be able to handle your symptoms better if you keep track of them.  Making some simple changes to your lifestyle will help.  Eating healthy is one thing you can do to take good care of yourself.    Notes: Will extend to allow ongoing recovery. Medication changes with delete of Coreg and restarted Entresto.       Raina Mina, RN Care Management Coordinator [place] Office [phone]

## 2020-03-17 ENCOUNTER — Ambulatory Visit: Payer: Medicare Other

## 2020-03-17 NOTE — Progress Notes (Unsigned)
Follow-up Outpatient Visit Date: 03/19/2020  Primary Care Provider: [name], [name], MD [address] [place] [address]  Chief Complaint: Follow-up chronic heart failure  HPI:  Danielle [name] is a 75 y.o. female with history of aortic valve disease (severe regurgitation by her description) s/p bioprosthetic aortic valve replacement in [place] in 03/2014, NICM with LVEF as low as 30-35% by report in 03/2015, paroxysmal atrial fibrillation on chronic warfarin, stroke x 2, hyperlipidemia, hypertension, hyperthyroidism,left breast cancer,spontaneous pneumothorax, and left hip fracture, who presents for follow-up of valvular heart disease and HFrEF.  I last saw her in late December, at which time she complained of continued exertional dyspnea with mild activity.  We agreed to proceed with [facility], which showed no CAD and mildly elevated left heart, right heart, and PA pressures.  No significant AVR stenosis was observed.  Furosemide was increased to 40 mg daily.  Today, [name] reports that she feels about the same as prior visits with significant exertional dyspnea with minimal activity.  She is scheduled to restart physical therapy next month.  She denies chest pain, palpitations, lightheadedness, or edema.  She does not feel much different with escalation of furosemide after recent catheterization.  She is back on warfarin and has completed her enoxaparin bridge.  She was unable to get her INR checked earlier this week through Dr. Doug Sou office due to inclement weather.  She has not had any bleeding but noticed some bruising about the left ankle yesterday.  --------------------------------------------------------------------------------------------------  Cardiovascular History & Procedures: Cardiovascular Problems:  Aortic valve disease status post bioprosthetic AVR in 03/2014  Non-ischemic cardiomyopathy  Paroxysmal atrial fibrillation  Stroke  Risk  Factors:  Hypertension, hyperlipidemia, stroke, and age > 15  Cath/PCI:  R/LHC (03/10/2020): No angiographically significant coronary artery disease.  Mildly elevated left heart, right heart, and PA pressures.  Normal Fick cardiac output/index.  No significant AVR gradient.  R/LHC (02/12/2014, [place]): Moderate proximal LAD calcification with mild, nonobstructive disease. Mildly reduced LVEF with severe apical hypokinesis. Normal pulmonary artery pressure.  CV Surgery:  Bioprosthetic aortic valve replacement (03/12/14, [place], [place], [place])  EP Procedures and Devices:  None  Non-Invasive Evaluation(s):  TTE (11/21/2019): Normal LV size with mild LVH. LVEF 45-50% with global hypokinesis. Normal RV size and function. Bioprosthetic aortic valve present with appropriate function (mean gradient 10 mmHg).  TTE (01/18/2019): Normal LV size with LVEF 35-40%. Mild LVH. Grade 1 diastolic dysfunction. Is in function. Bioprosthetic aortic valve with normal function. Normal PA pressure.  TTE (04/21/16): Normal obese size with moderate LVH. LVEF 35-40% with mid and apical anteroseptal hypokinesis. Grade 3 diastolic dysfunction noted. Aortic valve bioprosthesis present with a mean gradient of 11 mmHg. Mitral annular calcification noted. Normal RV size and function. Mild right atrial enlargement.  TTE (03/27/15, OSH): Mild LVH with LVEF 30-35%, mild left atrial enlargement, AVR in place without regurgitation.  TTE (05/10/14, OSH): LVEF 45-50%.   Recent CV Pertinent Labs: Lab Results  Component Value Date   CHOL 161 01/29/2019   HDL 64.90 01/29/2019   LDLCALC 81 01/29/2019   TRIG 77.0 01/29/2019   CHOLHDL 2 01/29/2019   INR 1.4 (H) 03/19/2020   BNP 93.2 12/21/2019   K 4.7 03/19/2020   K 4.3 12/22/2016   MG 1.9 12/22/2019   BUN 13 03/19/2020   BUN 11 02/27/2020   BUN 19.5 12/22/2016   CREATININE 1.10 (H) 03/19/2020   CREATININE 1.09 (H) 12/31/2019   CREATININE  1.2 (H) 12/22/2016  Past medical and surgical history were reviewed and updated in EPIC.  Current Meds  Medication Sig  . albuterol (PROVENTIL HFA;VENTOLIN HFA) 108 (90 Base) MCG/ACT inhaler Inhale 2 puffs into the lungs every 6 (six) hours as needed for wheezing or shortness of breath.   Marland Kitchen alendronate (FOSAMAX) 70 MG tablet Take 1 tablet (70 mg total) by mouth once a week. Take with a full glass of water on an empty stomach.  Marland Kitchen ascorbic acid (VITAMIN C) 500 MG tablet Take 500 mg by mouth daily.  Marland Kitchen aspirin EC 81 MG tablet Take 81 mg daily by mouth.  . carvedilol (COREG) 12.5 MG tablet Take 1 tablet (12.5 mg total) by mouth 2 (two) times daily with a meal.  . Cholecalciferol 25 MCG (1000 UT) tablet Take 1,000 Units by mouth daily.  . diclofenac sodium (VOLTAREN) 1 % GEL Apply 4 g topically 4 (four) times daily. (Patient taking differently: Apply 4 g topically 4 (four) times daily as needed (pain).)  . diphenhydramine-acetaminophen (TYLENOL PM) 25-500 MG TABS tablet Take 2 tablets by mouth at bedtime.  . enoxaparin (LOVENOX) 120 MG/0.8ML injection Inject 0.8 mLs (120 mg total) into the skin every 12 (twelve) hours.  . fluticasone (FLONASE) 50 MCG/ACT nasal spray Place 1 spray into both nostrils daily.  . furosemide (LASIX) 20 MG tablet Take 2 tablets (40 mg total) by mouth daily.  Marland Kitchen MELATONIN PO Take 5 mg by mouth at bedtime.  . methimazole (TAPAZOLE) 5 MG tablet Take 3 tablets (15 mg total) by mouth daily.  . mometasone-formoterol (DULERA) 200-5 MCG/ACT AERO Inhale 2 puffs into the lungs 2 (two) times daily.  . pantoprazole (PROTONIX) 40 MG tablet Take 1 tablet (40 mg total) by mouth daily.  . rosuvastatin (CRESTOR) 40 MG tablet Take 1 tablet (40 mg total) by mouth daily.  . sacubitril-valsartan (ENTRESTO) 49-51 MG Take 0.5 tablets by mouth 2 (two) times daily. (Patient taking differently: Take 1 tablet by mouth 2 (two) times daily.)  . senna-docusate (SENOKOT-S) 8.6-50 MG tablet Take 2  tablets at bedtime by mouth.   . tamoxifen (NOLVADEX) 20 MG tablet Take 1 tablet by mouth once daily (Patient taking differently: Take 20 mg by mouth daily.)  . traMADol (ULTRAM) 50 MG tablet Take 1 tablet (50 mg total) by mouth 3 (three) times daily.  Marland Kitchen trolamine salicylate (ASPERCREME) 10 % cream Apply 1 application topically as needed for muscle pain.  . valACYclovir (VALTREX) 500 MG tablet Take 1 tablet (500 mg total) by mouth 2 (two) times daily. For 3 days when outbreaks. (Patient taking differently: Take 500 mg by mouth 2 (two) times daily as needed (For 3 days when outbreaks.).)  . warfarin (COUMADIN) 3 MG tablet TAKE AS DIRECTED BY ANTICOAGULATION CLINIC (Patient taking differently: Take 3 mg by mouth daily. TAKE AS DIRECTED BY  ANTICOAGULATION  CLINIC)    Allergies: Lisinopril, Tetanus toxoid adsorbed, and Other  Social History   Tobacco Use  . Smoking status: Former Smoker    Packs/day: 0.25    Years: 10.00    Pack years: 2.50    Types: Cigarettes    Quit date: 03/01/2010    Years since quitting: 10.0  . Smokeless tobacco: Never Used  Vaping Use  . Vaping Use: Never used  Substance Use Topics  . Alcohol use: No  . Drug use: No    Family History  Problem Relation Age of Onset  . Diabetes Mother   . Heart attack Mother 90  . Diabetes Father   .  Lung cancer Father   . Diabetes Sister   . Thyroid disease Sister   . Diabetes Sister   . HIV Brother     Review of Systems: A 12-system review of systems was performed and was negative except as noted in the HPI.  --------------------------------------------------------------------------------------------------  Physical Exam: BP 112/78 (BP Location: Left Arm, Patient Position: Sitting, Cuff Size: Large)   Pulse 84   Ht 5\' 7"  (1.702 m)   Wt 251 lb (113.9 kg)   SpO2 97%   BMI 39.31 kg/m   General:  NAD.  Seated in a wheelchair. Neck: No gross JVD, though evaluation is limited by body habitus. Lungs: Mildly  diminished breath sounds throughout without wheezes or crackles. Heart: Regular rate and rhythm with occasional extrasystoles.  1/6 systolic murmur. Abdomen: Soft, nontender, nondistended. Extremities: Trace pretibial edema bilaterally.  Right radial/brachial catheterization sites are well-healed with 2+ right radial pulse.  EKG: Sinus rhythm with PACs and PVCs, inferior Q waves and poor R wave progression.  Lab Results  Component Value Date   WBC 5.1 02/27/2020   HGB 11.6 (L) 03/10/2020   HCT 34.0 (L) 03/10/2020   MCV 94 02/27/2020   PLT 111 (L) 02/27/2020    Lab Results  Component Value Date   NA 137 03/19/2020   K 4.7 03/19/2020   CL 107 03/19/2020   CO2 22 03/19/2020   BUN 13 03/19/2020   CREATININE 1.10 (H) 03/19/2020   GLUCOSE 97 03/19/2020   ALT 22 12/31/2019    Lab Results  Component Value Date   CHOL 161 01/29/2019   HDL 64.90 01/29/2019   LDLCALC 81 01/29/2019   TRIG 77.0 01/29/2019   CHOLHDL 2 01/29/2019    --------------------------------------------------------------------------------------------------  ASSESSMENT AND PLAN: Chronic HFrEF secondary to nonischemic cardiomyopathy: LVEF has been as low as 30-35% though it has improved based on most recent echo in 10/2019 (LVEF 45-50%).  Recent right and left heart catheterization showed no significant CAD but mildly elevated left and right heart filling pressures in the setting of normal cardiac output.  Symptoms are consistent with NYHA class III heart failure.  I will check a BMP today with plans to continue furosemide 40 mg daily as well as current regimen of Entresto and carvedilol.  Borderline low blood pressure precludes escalation of therapy at this time.  She has also been intolerant of spironolactone in the past due to hyperkalemia.  I am hopeful that her symptoms will improve with physical therapy.  She is currently to weak/deconditioned to participate in cardiac rehab, though this could be a consideration  down the road based on her progress with physical therapy.  Aortic valve disease status post bioprosthetic aortic valve regurgitation: No evidence of valve dysfunction on echo in September and recent catheterization.  Continue aspirin/anticoagulation as well as SBE prophylaxis.  Paroxysmal atrial fibrillation: No symptoms to suggest recurrent atrial fibrillation.  EKG today shows sinus rhythm with PACs and PVCs.  As INR has not been checked since catheterization and enoxaparin bridge is now complete, I will check an INR today and forward the results to our anticoagulation clinic to assist with ongoing management.  Continue carvedilol 12.5 mg twice daily for rate control.  Hyperlipidemia: LDL reasonably well controlled, particularly given the lack of CAD.  Continue rosuvastatin under the direction of Dr. [name].  Follow-up: Return to clinic in 2 months.  [name], MD 03/19/2020 1:07 PM

## 2020-03-19 ENCOUNTER — Encounter: Payer: Self-pay | Admitting: Internal Medicine

## 2020-03-19 ENCOUNTER — Other Ambulatory Visit
Admission: RE | Admit: 2020-03-19 | Discharge: 2020-03-19 | Disposition: A | Discharge status: Home / Self Care | Payer: Medicare Other | Source: Ambulatory Visit | Attending: Internal Medicine | Admitting: Internal Medicine

## 2020-03-19 ENCOUNTER — Other Ambulatory Visit: Payer: Self-pay

## 2020-03-19 ENCOUNTER — Ambulatory Visit: Payer: Medicare Other | Admitting: Internal Medicine

## 2020-03-19 ENCOUNTER — Ambulatory Visit (INDEPENDENT_AMBULATORY_CARE_PROVIDER_SITE_OTHER): Payer: Medicare Other

## 2020-03-19 VITALS — BP 112/78 | HR 84 | Ht 67.0 in | Wt 251.0 lb

## 2020-03-19 DIAGNOSIS — Z952 Presence of prosthetic heart valve: Secondary | ICD-10-CM | POA: Diagnosis not present

## 2020-03-19 DIAGNOSIS — E782 Mixed hyperlipidemia: Secondary | ICD-10-CM

## 2020-03-19 DIAGNOSIS — I10 Essential (primary) hypertension: Secondary | ICD-10-CM | POA: Diagnosis not present

## 2020-03-19 DIAGNOSIS — Z7901 Long term (current) use of anticoagulants: Secondary | ICD-10-CM | POA: Diagnosis not present

## 2020-03-19 DIAGNOSIS — I5022 Chronic systolic (congestive) heart failure: Secondary | ICD-10-CM | POA: Insufficient documentation

## 2020-03-19 DIAGNOSIS — Z5181 Encounter for therapeutic drug level monitoring: Secondary | ICD-10-CM

## 2020-03-19 DIAGNOSIS — I480 Paroxysmal atrial fibrillation: Secondary | ICD-10-CM | POA: Diagnosis not present

## 2020-03-19 DIAGNOSIS — I359 Nonrheumatic aortic valve disorder, unspecified: Secondary | ICD-10-CM | POA: Diagnosis not present

## 2020-03-19 LAB — BASIC METABOLIC PANEL
Anion gap: 8 (ref 5–15)
BUN: 13 mg/dL (ref 8–23)
CO2: 22 mmol/L (ref 22–32)
Calcium: 9.8 mg/dL (ref 8.9–10.3)
Chloride: 107 mmol/L (ref 98–111)
Creatinine, Ser: 1.1 mg/dL — ABNORMAL HIGH (ref 0.44–1.00)
GFR, Estimated: 53 mL/min — ABNORMAL LOW (ref >60)
Glucose, Bld: 97 mg/dL (ref 70–99)
Potassium: 4.7 mmol/L (ref 3.5–5.1)
Sodium: 137 mmol/L (ref 135–145)

## 2020-03-19 LAB — PROTIME-INR
INR: 1.4 — ABNORMAL HIGH (ref 0.8–1.2)
Prothrombin Time: 16.5 seconds — ABNORMAL HIGH (ref 11.4–15.2)

## 2020-03-19 NOTE — Patient Instructions (Addendum)
Medication Instructions:  Your physician recommends that you continue on your current medications as directed. Please refer to the Current Medication list given to you today.  *If you need a refill on your cardiac medications before your next appointment, please call your pharmacy*   Lab Work: Your physician recommends that you return for lab work in: [place] at [employer] as you leave. - BMET and STAT pt/inr - Please go to the [facility]. You will check in at the front desk to the right as you walk into the atrium. Valet Parking is offered if needed. - No appointment needed. You may go any day between 7 am and 6 pm.   If you have labs (blood work) drawn today and your tests are completely normal, you will receive your results only by: Marland Kitchen MyChart Message (if you have MyChart) OR . A paper copy in the mail If you have any lab test that is abnormal or we need to change your treatment, we will call you to review the results.   Testing/Procedures: none  Follow-Up: At [facility], you and your health needs are our priority.  As part of our continuing mission to provide you with exceptional heart care, we have created designated Provider Care Teams.  These Care Teams include your primary Cardiologist (physician) and Advanced Practice Providers (APPs -  Physician Assistants and Nurse Practitioners) who all work together to provide you with the care you need, when you need it.  We recommend signing up for the patient portal called "MyChart".  Sign up information is provided on this After Visit Summary.  MyChart is used to connect with patients for Virtual Visits (Telemedicine).  Patients are able to view lab/test results, encounter notes, upcoming appointments, etc.  Non-urgent messages can be sent to your provider as well.   To learn more about what you can do with MyChart, go to [email or web address].    Your next appointment:   2 month(s)  The format for your next appointment:    In Person  Provider:   You may see [name], MD or one of the following Advanced Practice Providers on your designated Care Team:    Murray Hodgkins, NP  Christell Faith, PA-C  Marrianne Mood, PA-C  Cadence Corcoran, [place]  Laurann Montana, NP

## 2020-03-19 NOTE — Patient Instructions (Addendum)
Spoke w/ pt.  Advised her to  -take 2 tablets warfarin today, then  -resume dose of warfarin: 1 tablet (3mg ) daily.   -Recheck INR next week w/ your PCP

## 2020-03-24 ENCOUNTER — Other Ambulatory Visit: Payer: Self-pay

## 2020-03-24 ENCOUNTER — Ambulatory Visit (INDEPENDENT_AMBULATORY_CARE_PROVIDER_SITE_OTHER): Payer: Medicare Other | Admitting: General Practice

## 2020-03-24 DIAGNOSIS — Z7901 Long term (current) use of anticoagulants: Secondary | ICD-10-CM

## 2020-03-24 LAB — POCT INR: INR: 2.3 (ref 2.0–3.0)

## 2020-03-24 NOTE — Patient Instructions (Signed)
Pre visit review using our clinic review tool, if applicable. No additional management support is needed unless otherwise documented below in the visit note.  Continue to take 1 (3 mg) tablet daily.  Re-check in 6 weeks.

## 2020-03-31 ENCOUNTER — Other Ambulatory Visit: Payer: Self-pay | Admitting: Physical Medicine & Rehabilitation

## 2020-03-31 DIAGNOSIS — M159 Polyosteoarthritis, unspecified: Secondary | ICD-10-CM

## 2020-03-31 DIAGNOSIS — G894 Chronic pain syndrome: Secondary | ICD-10-CM

## 2020-03-31 NOTE — Telephone Encounter (Signed)
Patient needs a refill on Tramadol.  She has an appt with Dr. Letta Pate on 04/17/20.

## 2020-04-01 MED ORDER — TRAMADOL HCL 50 MG PO TABS: 50.0000 mg | ORAL_TABLET | Freq: Three times a day (TID) | ORAL | 0 refills | Status: DC

## 2020-04-02 ENCOUNTER — Telehealth: Payer: Self-pay

## 2020-04-02 NOTE — Telephone Encounter (Signed)
Prior authorization for Tramadol 50 MG, 90 day supply approved. [facility] informed. Patient informed. ( FYIMikle [name] (last name should be entered as ) Barista)

## 2020-04-08 ENCOUNTER — Other Ambulatory Visit: Payer: Self-pay

## 2020-04-08 ENCOUNTER — Other Ambulatory Visit: Payer: Self-pay | Admitting: Internal Medicine

## 2020-04-08 DIAGNOSIS — E0590 Thyrotoxicosis, unspecified without thyrotoxic crisis or storm: Secondary | ICD-10-CM

## 2020-04-08 MED ORDER — METHIMAZOLE 5 MG PO TABS: 15.0000 mg | ORAL_TABLET | Freq: Every day | ORAL | 2 refills | Status: DC

## 2020-04-11 ENCOUNTER — Other Ambulatory Visit: Payer: Self-pay | Admitting: *Deleted

## 2020-04-11 NOTE — Patient Outreach (Signed)
[facility]) Care Management  04/11/2020  Danielle [name] November 09, 1945 [id number]   Telephone Assessment-Successful-HF   RN spoke with pt today and education on several topics. Pt indicated she was a no code however this decision will probable be changed based upon the education provided today. Pt assume there was no coming back and felt this was forces with the CPR performed. After much education pt will discuss with her spouse for possible change in her existing A.D packet with this decision enclosed. Verified pt remains in the GREEN zone with no acute symptoms encountered. Pt verified ongoing daily weights with no swelling or noticeable symptoms prevented today.   Plan of care reviewed and discussed with notation on pt's progress. Will follow up next month as pt wishes to continue to receive monthly calls concerning her ongoing plan of care. Will follow up accordingly and update the provider via quarterly on pt's ongoing progress.  Goals Addressed            This Visit's Progress   . THN-Comorbidities Identified and Managed   On track    Follow up Date: 05/08/2020 Timeframe:  Long-Range Goal Priority:    Medium Start Date:   03/13/2020                          Expected End Date:   06/27/2020                       Evidence-based guidance:   Assess and address signs/symptoms of comorbidity, including dyslipidemia, diabetes, iron deficiency, gout, arthritis, dysrhythmia, hypertension, cachexia, coronary artery disease, kidney dysfunction and lung disease.   Prepare patient for laboratory and diagnostic exams based on risk and presentation.   Monitor side effects and anticipate need for periodic adjustments.   Prepare patient for potential invasive treatment, such as implantable cardioverter-defibrillator, cardiac resynchronization therapy or heart transplant as disease progresses.   Notes:  Feb-Will verify pt is managing all her comorbidities with no  related problems. Will continue to stress the importance of daily monitoring and report any symptoms to her provider to prevent acute events.    . THN-Track and Manage Fluids and Swelling-Heart Failure   On track    Follow Up Date 05/08/2020 Timeframe:  Short-Term Goal Priority:  Medium Start Date:      03/13/2020                       Expected End Date:       3/312022                   - call office if I gain more than 2 pounds in one day or 5 pounds in one week - do ankle pumps when sitting - keep legs up while sitting - track weight in diary - use salt in moderation - watch for swelling in feet, ankles and legs every day - weigh myself daily    Why is this important?    It is important to check your weight daily and watch how much salt and liquids you have.   It will help you to manage your heart failure.    Notes:  Feb-Verified pt remains in the GREEN with no fluid retention of weight gained of 3 lbs overnight or 5 lbs within the one week. Pt remains symptoms free with encountered symptoms. Will continue to encouraged daily weights and to document all readings for  providers to view.    . THN-Track and Manage Symptoms   On track    Follow Up Date; 05/08/2020 Timeframe:  Short-Term Goal Priority:  Medium Start Date:     03/13/2020                        Expected End Date:  05/29/2020                       - begin a heart failure diary - develop a rescue plan - eat more whole grains, fruits and vegetables, lean meats and healthy fats - follow rescue plan if symptoms flare-up - know when to call the doctor - track symptoms and what helps feel better or worse    Why is this important?   You will be able to handle your symptoms better if you keep track of them.  Making some simple changes to your lifestyle will help.  Eating healthy is one thing you can do to take good care of yourself.    Notes:  Feb-Pt recent underwent heart catheretization with no disease found. Pt was  all clear and took Lovenox for 2 weeks during recovery. No long term medication ordered as pt continues to recover. Pt will resume outpt PT in 3 weeks  Jan-Will extend to allow ongoing recovery. Medication changes with delete of Coreg and restarted Entresto.       Raina Mina, RN Care Management Coordinator [place] Office [phone]

## 2020-04-17 ENCOUNTER — Encounter: Payer: Self-pay | Admitting: Physical Medicine & Rehabilitation

## 2020-04-17 ENCOUNTER — Encounter: Payer: Medicare Other | Attending: Physical Medicine & Rehabilitation | Admitting: Physical Medicine & Rehabilitation

## 2020-04-17 ENCOUNTER — Other Ambulatory Visit: Payer: Self-pay

## 2020-04-17 DIAGNOSIS — M159 Polyosteoarthritis, unspecified: Secondary | ICD-10-CM | POA: Diagnosis not present

## 2020-04-17 DIAGNOSIS — G894 Chronic pain syndrome: Secondary | ICD-10-CM | POA: Insufficient documentation

## 2020-04-17 MED ORDER — TRAMADOL HCL 50 MG PO TABS
50.0000 mg | ORAL_TABLET | Freq: Three times a day (TID) | ORAL | 0 refills | Status: DC
Start: 2020-04-17 — End: 2020-06-17

## 2020-04-17 NOTE — Progress Notes (Signed)
Subjective:    Patient ID: Danielle [name], female    DOB: 22-May-1945, 75 y.o.   MRN: [id number]  HPI 75 year old female with primary complaint of chronic low back pain.  She did not receive any long-term relief from the lumbar medial branch blocks targeting the L3 and L4 medial branches and L5 dorsal ramus to block the L4-5 and L5-S1 facet joint complexes.  The patient did have 100% relief on a short-term basis after each of the injections performed on 07/24/2019 and 08/24/2019 The patient continues to take tramadol 50 mg 2 or 3 times per day.  She has had no side effects from this medication. Interval medical history: Right and left heart cath performed on 03/20/2020 was reportedly normal.  Patient has followed up with cardiology on this. Pain Inventory Average Pain 7 Pain Right Now 7 My pain is sharp  In the last 24 hours, has pain interfered with the following? General activity 7 Relation with others 0 Enjoyment of life 0 What TIME of day is your pain at its worst? varies Sleep (in general) Fair  Pain is worse with: walking and some activites Pain improves with: medication Relief from Meds: na  Family History  Problem Relation Age of Onset  . Diabetes Mother   . Heart attack Mother 7  . Diabetes Father   . Lung cancer Father   . Diabetes Sister   . Thyroid disease Sister   . Diabetes Sister   . HIV Brother    Social History   Socioeconomic History  . Marital status: Married    Spouse name: Sherwood  . Number of children: 0  . Years of education: Not on file  . Highest education level: Not on file  Occupational History  . Occupation: Retired in 2004  Tobacco Use  . Smoking status: Former Smoker    Packs/day: 0.25    Years: 10.00    Pack years: 2.50    Types: Cigarettes    Quit date: 03/01/2010    Years since quitting: 10.1  . Smokeless tobacco: Never Used  Vaping Use  . Vaping Use: Never used  Substance and Sexual Activity  . Alcohol use: No  .  Drug use: No  . Sexual activity: Not Currently  Other Topics Concern  . Not on file  Social History Narrative   Lives with husband.  Ambulated independently.   Social Determinants of Health   Financial Resource Strain: Medium Risk  . Difficulty of Paying Living Expenses: Somewhat hard  Food Insecurity: No Food Insecurity  . Worried About Charity fundraiser in the Last Year: Never true  . Ran Out of Food in the Last Year: Never true  Transportation Needs: No Transportation Needs  . Lack of Transportation (Medical): No  . Lack of Transportation (Non-Medical): No  Physical Activity: Sufficiently Active  . Days of Exercise per Week: 7 days  . Minutes of Exercise per Session: 30 min  Stress: Stress Concern Present  . Feeling of Stress : To some extent  Social Connections: Moderately Integrated  . Frequency of Communication with Friends and Family: More than three times a week  . Frequency of Social Gatherings with Friends and Family: Once a week  . Attends Religious Services: More than 4 times per year  . Active Member of Clubs or Organizations: No  . Attends Archivist Meetings: Never  . Marital Status: Married   Past Surgical History:  Procedure Laterality Date  . BREAST LUMPECTOMY Left 12/2016  .  BREAST LUMPECTOMY WITH RADIOACTIVE SEED AND SENTINEL LYMPH NODE BIOPSY Left 01/14/2017   Procedure: LEFT BREAST LUMPECTOMY WITH RADIOACTIVE SEED AND SENTINEL LYMPH NODE BIOPSY;  Surgeon: Rolm Bookbinder, MD;  Location: [facility];  Service: General;  Laterality: Left;  . CHEST TUBE INSERTION  10/2015  . INTRAMEDULLARY (IM) NAIL INTERTROCHANTERIC Left 12/10/2015   Procedure: INTRAMEDULLARY (IM) NAIL INTERTROCHANTRIC;  Surgeon: Leandrew Koyanagi, MD;  Location: [facility];  Service: Orthopedics;  Laterality: Left;  . PLEURADESIS Left 12/03/2015   Procedure: PLEURADESIS;  Surgeon: Melrose Nakayama, MD;  Location: [facility];  Service: Thoracic;  Laterality: Left;  . RESECTION OF APICAL BLEB  Left 12/03/2015   Procedure: BLEBECTOMY;  Surgeon: Melrose Nakayama, MD;  Location: [facility];  Service: Thoracic;  Laterality: Left;  . RIGHT/LEFT HEART CATH AND CORONARY ANGIOGRAPHY N/A 03/10/2020   Procedure: RIGHT/LEFT HEART CATH AND CORONARY ANGIOGRAPHY;  Surgeon: [name], MD;  Location: [facility];  Service: Cardiovascular;  Laterality: N/A;  . THORACOSCOPY  12/03/2015  . VALVE REPLACEMENT    . VIDEO ASSISTED THORACOSCOPY Left 12/03/2015   Procedure: VIDEO ASSISTED THORACOSCOPY;  Surgeon: Melrose Nakayama, MD;  Location: [facility];  Service: Thoracic;  Laterality: Left;   Past Surgical History:  Procedure Laterality Date  . BREAST LUMPECTOMY Left 12/2016  . BREAST LUMPECTOMY WITH RADIOACTIVE SEED AND SENTINEL LYMPH NODE BIOPSY Left 01/14/2017   Procedure: LEFT BREAST LUMPECTOMY WITH RADIOACTIVE SEED AND SENTINEL LYMPH NODE BIOPSY;  Surgeon: Rolm Bookbinder, MD;  Location: [facility];  Service: General;  Laterality: Left;  . CHEST TUBE INSERTION  10/2015  . INTRAMEDULLARY (IM) NAIL INTERTROCHANTERIC Left 12/10/2015   Procedure: INTRAMEDULLARY (IM) NAIL INTERTROCHANTRIC;  Surgeon: Leandrew Koyanagi, MD;  Location: [facility];  Service: Orthopedics;  Laterality: Left;  . PLEURADESIS Left 12/03/2015   Procedure: PLEURADESIS;  Surgeon: Melrose Nakayama, MD;  Location: [facility];  Service: Thoracic;  Laterality: Left;  . RESECTION OF APICAL BLEB Left 12/03/2015   Procedure: BLEBECTOMY;  Surgeon: Melrose Nakayama, MD;  Location: [facility];  Service: Thoracic;  Laterality: Left;  . RIGHT/LEFT HEART CATH AND CORONARY ANGIOGRAPHY N/A 03/10/2020   Procedure: RIGHT/LEFT HEART CATH AND CORONARY ANGIOGRAPHY;  Surgeon: [name], MD;  Location: [facility];  Service: Cardiovascular;  Laterality: N/A;  . THORACOSCOPY  12/03/2015  . VALVE REPLACEMENT    . VIDEO ASSISTED THORACOSCOPY Left 12/03/2015   Procedure: VIDEO ASSISTED THORACOSCOPY;  Surgeon: Melrose Nakayama, MD;  Location: [facility];  Service: Thoracic;  Laterality: Left;   Past Medical History:  Diagnosis Date  . Allergy   . Anxiety   . Aortic stenosis    Status post bioprosthetic AVR  . Arthritis    DJD  . Asthma   . Breast cancer ([facility]) 12/13/2016   Left breast  . Chronic systolic CHF (congestive heart failure) ([facility])    EF 30% 04/2014  . COPD (chronic obstructive pulmonary disease) ([facility])   . Coronary artery disease    per pt, had LHC prior to AVR in [place] that did not show any blockages; no stents/bypass  . Gastritis   . Hyperlipidemia   . Hypertension   . Hyperthyroidism   . Multiple thyroid nodules   . Osteoporosis   . Paroxysmal atrial fibrillation (HCC)   . Pelvis fracture ([facility]) 08/19/2015   MULTIPLE   . Personal history of radiation therapy 2018  . Spontaneous pneumothorax 11/28/2015   left   . Stroke [facility]) 2000  rt hand weak   BP 123/84   Pulse 83   Temp 98.8 F (37.1 C)   Ht 5\' 7"  (1.702 m)   Wt 252 lb (114.3 kg)   SpO2 95%   BMI 39.47 kg/m   Opioid Risk Score:   Fall Risk Score:  `1  Depression screen PHQ 2/9  Depression screen [facility] 2/9 04/17/2020 12/28/2019 12/05/2019 08/24/2019 07/24/2019 09/18/2018 09/18/2018  Decreased Interest 1 0 0 0 0 0 0  Down, Depressed, Hopeless 1 0 0 0 0 0 0  PHQ - 2 Score 2 0 0 0 0 0 0  Some recent data might be hidden    Review of Systems  Constitutional: Negative.   HENT: Negative.   Eyes: Negative.   Respiratory: Negative.   Cardiovascular: Negative.   Gastrointestinal: Negative.   Endocrine: Negative.   Genitourinary: Negative.   Musculoskeletal: Positive for gait problem.       Knee pain  Skin: Negative.   Allergic/Immunologic: Negative.   Hematological: Negative.   Psychiatric/Behavioral: Positive for dysphoric mood.  All other systems reviewed and are negative.      Objective:   Physical Exam Vitals and nursing note reviewed.  Constitutional:      Appearance: She is obese.  Eyes:     Extraocular Movements: Extraocular  movements intact.     Conjunctiva/sclera: Conjunctivae normal.     Pupils: Pupils are equal, round, and reactive to light.  Musculoskeletal:        General: Tenderness present.     Comments: Patient has a forward flexed posture leans on her walker to ambulate  Tenderness palpation bilateral L4-L5 paraspinal areas  No tenderness to palpation below L5  Skin:    General: Skin is warm and dry.  Neurological:     General: No focal deficit present.     Mental Status: She is alert and oriented to person, place, and time.     Comments: Negative straight leg raising Motor strength is 5/5 bilateral hip flexor knee extensor ankle dorsiflexor   Psychiatric:        Mood and Affect: Mood normal.           Assessment & Plan:  #1.  Lumbar spondylosis without myelopathy primarily affecting L4-5 and L5-S1 levels.  She had 100% pain relief with medial branch blocks targeting these levels.  We discussed procedures that may extend the duration of benefit, namely radiofrequency neurotomy of the same nerves.  The patient has equal pain on the right and left side of her low back. We will scheduled for right-sided L3-L4 medial branch L5 dorsal ramus radiofrequency neurotomy under fluoroscopic guidance next month and will perform the same procedure on the left side the following month. Discussed with patient agrees with plan Continue tramadol 50 mg 3 times daily but we may be able to wean down off of this depending on degree of relief with the radiofrequency neurotomy.

## 2020-04-17 NOTE — Patient Instructions (Signed)
Plan is for radiofrequency ablation Right side next month and left side the following month

## 2020-04-23 ENCOUNTER — Telehealth: Payer: Self-pay | Admitting: Nurse Practitioner

## 2020-04-23 ENCOUNTER — Encounter: Payer: Self-pay | Admitting: *Deleted

## 2020-04-23 NOTE — Telephone Encounter (Signed)
Moved upcoming appointment due to provider's template. Patient is aware of changes. 

## 2020-04-29 ENCOUNTER — Other Ambulatory Visit: Payer: Self-pay | Admitting: Internal Medicine

## 2020-04-30 ENCOUNTER — Telehealth: Payer: Self-pay

## 2020-04-30 MED ORDER — CARVEDILOL 12.5 MG PO TABS: 12.5000 mg | ORAL_TABLET | Freq: Two times a day (BID) | ORAL | 0 refills | Status: DC

## 2020-04-30 NOTE — Telephone Encounter (Signed)
Requested Prescriptions   Signed Prescriptions Disp Refills   carvedilol (COREG) 12.5 MG tablet 60 tablet 0    Sig: Take 1 tablet (12.5 mg total) by mouth 2 (two) times daily with a meal.    Authorizing Provider: END, CHRISTOPHER    Ordering User: NEWCOMER MCCLAIN, Mekhi Sonn L

## 2020-04-30 NOTE — Addendum Note (Signed)
Addended by: Raelene Bott, Danyelle Brookover L on: 04/30/2020 12:04 PM   Modules accepted: Orders

## 2020-04-30 NOTE — Telephone Encounter (Signed)
*  STAT* If patient is at the pharmacy, call can be transferred to refill team.   1. Which medications need to be refilled? (please list name of each medication and dose if known) carvedilol 12.5 MG 1 tablet 2 times daily  2. Which pharmacy/location (including street and city if local pharmacy) is medication to be sent to? [facility]  3. Do they need a 30 day or 90 day supply? 30 day  Patient has a prescription coming from [place] but it will be about 7 days before she will receive and she is out.  Will need a small amount sent to local pharmacy.

## 2020-05-01 ENCOUNTER — Ambulatory Visit: Payer: Medicare Other

## 2020-05-05 ENCOUNTER — Ambulatory Visit (INDEPENDENT_AMBULATORY_CARE_PROVIDER_SITE_OTHER): Payer: Medicare Other | Admitting: General Practice

## 2020-05-05 DIAGNOSIS — Z7901 Long term (current) use of anticoagulants: Secondary | ICD-10-CM

## 2020-05-05 LAB — POCT INR: INR: 4.3 — AB (ref 2.0–3.0)

## 2020-05-05 NOTE — Patient Instructions (Addendum)
Pre visit review using our clinic review tool, if applicable. No additional management support is needed unless otherwise documented below in the visit note.  Skip warfarin today and tomorrow (3/7 and 3/8) and then continue to take 1 (3 mg) tablet daily.  Re-check in 4 weeks.

## 2020-05-06 ENCOUNTER — Encounter: Payer: Self-pay | Admitting: *Deleted

## 2020-05-06 ENCOUNTER — Telehealth: Payer: Self-pay | Admitting: *Deleted

## 2020-05-06 NOTE — Telephone Encounter (Signed)
-----   Message from [name], MD sent at 04/24/2020 10:24 AM EST ----- Looks like pt did not complete this will need prior to next visit with me so I can review ----- Message ----- From: SYSTEM Sent: 04/22/2020  12:13 AM EST To: [name], MD

## 2020-05-06 NOTE — Telephone Encounter (Signed)
OPENED IN ERROR

## 2020-05-06 NOTE — Telephone Encounter (Signed)
Call placed at 9:32 today for Danielle [name] to come in for urine test that was not collected at her last visit. She is aware she has 24 hours to comply with this request.

## 2020-05-07 DIAGNOSIS — G894 Chronic pain syndrome: Secondary | ICD-10-CM | POA: Diagnosis not present

## 2020-05-08 ENCOUNTER — Other Ambulatory Visit: Payer: Self-pay | Admitting: *Deleted

## 2020-05-08 DIAGNOSIS — I5022 Chronic systolic (congestive) heart failure: Secondary | ICD-10-CM

## 2020-05-08 NOTE — Patient Outreach (Signed)
[facility]) Care Management  05/08/2020  Danielle [name] 03-10-1945 [id number]   Telephone Assessment-Successful-CHF  RN spoke with pt today and received an update. Pt states she is doing well and has been wearing her compression stockings at night. Strongly encourage pt not wear her compressions when sleeping only while up during the day when mobile (verbalized an understanding). Pt recites her ongoing weights at baseline 251-252 lbs with no fluid retention or weight gained of 3 lbs overnight or 5 lbs within one week. Pt remains in the GREEN zone with no acute symptoms reported today.   Plan of care reviewed and discussed to noted updates related to her ongoing HF. Pt continue to be on track however issues with her recent loss of her sister. Pt states he gets down and her eating habits are bad. Requested health eating habits related to a customize plan or menu. Educated and offered to send emmi educations information on healthy eating habits and DASH diet alone with consulting a nutritionists or dietitian (pt receptive). Will reach out to pt's provider with this request. Moral support offer and possible referral for a psychiatrist however declined but receptive the [facility] social worker.   Will request a referral via Dr. [name] for a psych consult and [facility] social worker for grievance counseling along with educational EMMI on healthy eating habits.    No further request at this time as RN will follow up next month with ongoing care management services. Plan of care updated with notations and progress.  Goals Addressed            This Visit's Progress   . THN-Comorbidities Identified and Managed   On track    Follow up Date: 06/09/2020 Timeframe:  Long-Range Goal Priority:    Medium Start Date:   03/13/2020                          Expected End Date:   06/27/2020                       Evidence-based guidance:   Assess and address signs/symptoms of comorbidity,  including dyslipidemia, diabetes, iron deficiency, gout, arthritis, dysrhythmia, hypertension, cachexia, coronary artery disease, kidney dysfunction and lung disease.   Prepare patient for laboratory and diagnostic exams based on risk and presentation.   Monitor side effects and anticipate need for periodic adjustments.   Prepare patient for potential invasive treatment, such as implantable cardioverter-defibrillator, cardiac resynchronization therapy or heart transplant as disease progresses.   Notes:  Feb-Will verify pt is managing all her comorbidities with no related problems. Will continue to stress the importance of daily monitoring and report any symptoms to her provider to prevent acute events.    . COMPLETED: THN-Track and Manage Fluids and Swelling-Heart Failure       Follow Up Date 05/08/2020 Timeframe:  Short-Term Goal Priority:  Medium Start Date:      03/13/2020                       Expected End Date:       3/312022                   - call office if I gain more than 2 pounds in one day or 5 pounds in one week - do ankle pumps when sitting - keep legs up while sitting - track weight in diary - use salt in  moderation - watch for swelling in feet, ankles and legs every day - weigh myself daily    Why is this important?    It is important to check your weight daily and watch how much salt and liquids you have.   It will help you to manage your heart failure.    Notes:  Feb-Verified pt remains in the GREEN with no fluid retention of weight gained of 3 lbs overnight or 5 lbs within the one week. Pt remains symptoms free with encountered symptoms. Will continue to encouraged daily weights and to document all readings for providers to view.    . THN-Track and Manage Symptoms   On track    Follow Up Date: 06/09/2020 Timeframe:  Short-Term Goal Priority:  Medium Start Date:     03/13/2020                        Expected End Date:  06/27/2020                       - begin  a heart failure diary - develop a rescue plan - eat more whole grains, fruits and vegetables, lean meats and healthy fats - follow rescue plan if symptoms flare-up - know when to call the doctor - track symptoms and what helps feel better or worse    Why is this important?   You will be able to handle your symptoms better if you keep track of them.  Making some simple changes to your lifestyle will help.  Eating healthy is one thing you can do to take good care of yourself.    Notes:  3/10-Discussed symptoms and verified pt remains in the GREEN zone with no symptoms or flare ups. Pt aware when to call her provider with 3 lbs overnight or 5 lbs within one week (251 lbs baseline weight reported). Pt reports unhealthy eating habits and requested education. Will education and provider educational EMMI on healthy dietary habits and requested PCP for dietary as pt receptive with this request. Feb-Pt recent underwent heart catheretization with no disease found. Pt was all clear and took Lovenox for 2 weeks during recovery. No long term medication ordered as pt continues to recover. Pt will resume outpt PT in 3 weeks  Jan-Will extend to allow ongoing recovery. Medication changes with delete of Coreg and restarted Entresto.       Raina Mina, RN Care Management Coordinator [place] Office [phone]

## 2020-05-09 ENCOUNTER — Other Ambulatory Visit: Payer: Self-pay | Admitting: *Deleted

## 2020-05-09 ENCOUNTER — Encounter: Payer: Self-pay | Admitting: *Deleted

## 2020-05-09 ENCOUNTER — Telehealth: Payer: Medicare Other | Admitting: *Deleted

## 2020-05-09 ENCOUNTER — Telehealth: Payer: Self-pay | Admitting: *Deleted

## 2020-05-09 ENCOUNTER — Telehealth: Payer: Self-pay | Admitting: Pharmacist

## 2020-05-09 MED ORDER — CARVEDILOL 12.5 MG PO TABS: 12.5000 mg | ORAL_TABLET | Freq: Two times a day (BID) | ORAL | 1 refills | Status: DC

## 2020-05-09 NOTE — Progress Notes (Deleted)
  Chronic Care Management   Outreach Note  05/09/2020 Name: Danielle [name] MRN: [id number] DOB: April 25, 1945  Referred by: [name], [name], MD Reason for referral : No chief complaint on file.   An unsuccessful telephone outreach was attempted today. The patient was referred to the case management team for assistance with care management and care coordination for mental health counseling services.  Follow Up Plan: A HIPAA compliant phone message was left for the patient providing contact information and requesting a return call.  CSW will make a second outreach attempt next week, on 05/15/2020 at 3:00pm.    [name] LCSW Licensed Clinical Social Worker [place] [phone]

## 2020-05-09 NOTE — Chronic Care Management (AMB) (Signed)
Chronic Care Management Pharmacy Assistant   Name: Danielle [name]  MRN: [id number] DOB: 1946-01-12  Reason for Encounter:General Adherence Call   Recent office visits:   12.01.2021 [name], [name], MD Family Medicine o Added-Fluticasone Propionate 50 MCG/ACT 1 spray Daily o Changed- Methimazole 15 mg Oral Daily   11.01.2021 [name], [name], MD Family Medicine o Added -Fluconazole 150 mg Oral Once o D/C-Budesonide-Formoterol Fumarate 160-4.5 MCG/ACT 2 puffs Inhalation 2 times daily Recent consult visits:   02.17.2022 Danielle Blake, MD Physical Medicine, and Rehabilitation  [phone] Danielle Bush, MD Cardiology  02.17.2022 Danielle Blake, MD Physical Medicine, and Rehabilitation  01.19.2022 Danielle Bush, MD Cardiology  12.29.2021 Danielle [name], [place] Hospital visits:  Medication Reconciliation was completed by comparing discharge summary, patients EMR and Pharmacy list, and upon discussion with patient.  Admitted to the hospital on 03/10/2020 due to Chronic HFrFE . Discharge date was 03/10/2020. Discharged from [facility]?Medications Started at [facility] Discharge:?? -None  Medication Changes at Hospital Discharge: -Changed - Furosemide 20 mg tablets : take 2 tablets daily  Medications that remain the same after Hospital Discharge:??  -All other medications will remain the same.    Medications: Outpatient Encounter Medications as of 05/09/2020  Medication Sig Note   albuterol (PROVENTIL HFA;VENTOLIN HFA) 108 (90 Base) MCG/ACT inhaler Inhale 2 puffs into the lungs every 6 (six) hours as needed for wheezing or shortness of breath.     alendronate (FOSAMAX) 70 MG tablet Take 1 tablet (70 mg total) by mouth once a week. Take with a full glass of water on an empty stomach. 03/04/2020: Saturday   ascorbic acid (VITAMIN C) 500 MG tablet Take 500 mg by mouth daily.    aspirin EC 81 MG tablet Take 81  mg daily by mouth.    Cholecalciferol 25 MCG (1000 UT) tablet Take 1,000 Units by mouth daily.    diclofenac sodium (VOLTAREN) 1 % GEL Apply 4 [name] topically 4 (four) times daily. (Patient taking differently: Apply 4 [name] topically 4 (four) times daily as needed (pain).)    diphenhydramine-acetaminophen (TYLENOL PM) 25-500 MG TABS tablet Take 2 tablets by mouth at bedtime.    fluticasone (FLONASE) 50 MCG/ACT nasal spray Place 1 spray into both nostrils daily.    furosemide (LASIX) 20 MG tablet Take 2 tablets (40 mg total) by mouth daily.    MELATONIN PO Take 5 mg by mouth at bedtime.    methimazole (TAPAZOLE) 5 MG tablet Take 3 tablets (15 mg total) by mouth daily.    mometasone-formoterol (DULERA) 200-5 MCG/ACT AERO Inhale 2 puffs into the lungs 2 (two) times daily.    pantoprazole (PROTONIX) 40 MG tablet Take 1 tablet (40 mg total) by mouth daily.    rosuvastatin (CRESTOR) 40 MG tablet Take 1 tablet (40 mg total) by mouth daily.    sacubitril-valsartan (ENTRESTO) 49-51 MG Take 0.5 tablets by mouth 2 (two) times daily. (Patient taking differently: Take 1 tablet by mouth 2 (two) times daily.)    senna-docusate (SENOKOT-S) 8.6-50 MG tablet Take 2 tablets at bedtime by mouth.     tamoxifen (NOLVADEX) 20 MG tablet Take 1 tablet by mouth once daily (Patient taking differently: Take 20 mg by mouth daily.)    traMADol (ULTRAM) 50 MG tablet Take 1 tablet (50 mg total) by mouth 3 (three) times daily. 05/16/2020: On hand #33 Last taken today Last filled on  04/02/20  for  90   trolamine salicylate (  ASPERCREME) 10 % cream Apply 1 application topically as needed for muscle pain.    valACYclovir (VALTREX) 500 MG tablet Take 1 tablet (500 mg total) by mouth 2 (two) times daily. For 3 days when outbreaks. (Patient taking differently: Take 500 mg by mouth 2 (two) times daily as needed (For 3 days when outbreaks.).)    warfarin (COUMADIN) 3 MG tablet TAKE AS DIRECTED BY ANTICOAGULATION CLINIC (Patient  taking differently: Take 3 mg by mouth daily. TAKE AS DIRECTED BY  ANTICOAGULATION  CLINIC)    [DISCONTINUED] carvedilol (COREG) 12.5 MG tablet Take 1 tablet (12.5 mg total) by mouth 2 (two) times daily with a meal.    No facility-administered encounter medications on file as of 05/09/2020.   I spoke with the patient and discussed medication adherence. She does not have an issue with her current medication. The patient states that she has been doing well.  She states he tries to eat more and healthy. She has increased her water intake. She has not been taking her blood pressure at home. She denies any emergency department visits or urgent care visits since January 2022. She did not have any side effects from her current medications period she states that she is not having any issues with her current pharmacy period.  Maia Breslow, [place] [phone]

## 2020-05-09 NOTE — Patient Outreach (Signed)
[facility]) Care Management  05/09/2020  Danielle [name] 09-Aug-1945 [id number]   [facility] made an initial attempt to try and contact patient today to perform a phone assessment, as well as assess and assist with social work needs and services, without success.  A HIPAA compliant message was left for patient on voicemail and CSW is currently awaiting a return call.  CSW will make a second outreach attempt within the next 3-4 business days, if a return call is not received from patient in the meantime.  CSW will also mail an Outreach Letter to patient's home, requesting that she contact CSW at her earliest convenience, if she is interested in receiving social work services through [place] with Scientist, clinical (histocompatibility and immunogenetics).  [name], BSW, MSW, LCSW  Licensed Education officer, [facility]  Mailing [place] [address], [place], [place] [address] Physical Address-[address], [place], [place] [address] Toll Free Main # [phone] Fax # [phone] Cell # [phone]  [email or web address]

## 2020-05-09 NOTE — Telephone Encounter (Signed)
Review of patient's medical record for assessment of needs.  [name] LCSW Licensed Clinical Social Worker [place] [phone]

## 2020-05-09 NOTE — Addendum Note (Signed)
Addended by: Raina Mina D on: 05/09/2020 04:05 PM   Modules accepted: Orders

## 2020-05-09 NOTE — Social Work (Signed)
     Chronic Care Management   Outreach Note  05/09/2020 Name: Danielle [name] MRN: [id number] DOB: [birth year]  Referred by: [name], [name], MD Reason for referral : Chronic Care Management (Chronic Pain Disorder, CKD Stage III, Depressive Disorder.)   An unsuccessful telephone outreach was attempted today. The patient was referred to the case management team for assistance with care management and care coordination related to symptoms of Depression, Chronic Pain Disorder and Stage III Chronic Kidney Disease.  CSW left a HIPAA compliant message on voicemail for patient and is currently awaiting a return call.  CSW will make a second outreach attempt next week, if a return call is not received from patient in the meantime.  Follow Up Plan: Telephone follow up appointment with care management team member scheduled for:  05/15/2020 at 3:00pm.  [name] [place] Licensed Clinical Social Worker [place] [phone]

## 2020-05-09 NOTE — Patient Outreach (Signed)
[facility]) Care Management  05/09/2020  Heidi Maclin [birth year] [id number]   Case closure  Pt with an embedded office and has a case manager Peter Garter, RN. RN has provided report and update on this pt for ongoing case management needs.  Raina Mina, RN Care Management Coordinator [place] Office [phone]

## 2020-05-15 ENCOUNTER — Ambulatory Visit: Payer: Self-pay | Admitting: *Deleted

## 2020-05-15 ENCOUNTER — Telehealth: Payer: Medicare Other | Admitting: *Deleted

## 2020-05-15 ENCOUNTER — Telehealth: Payer: Self-pay | Admitting: *Deleted

## 2020-05-15 NOTE — Telephone Encounter (Signed)
  Chronic Care Management   Outreach Note  05/15/2020 Name: Lithzy Bernard MRN: [id number] DOB: [birth year]  Referred by: [name], [name], MD Reason for referral : Chronic Care Management (2nd Unsuccessful Outreach Attempt.)   A second unsuccessful telephone outreach was attempted today. The patient was referred to the case management team for assistance with care management and care coordination. A HIPAA compliant message was left on voicemail for patient.  CSW will make a third outreach attempt next week, if a return call is not received from patient in the meantime.  Contact information was provided.  Follow Up Plan: Telephone follow up appointment with care management team member scheduled for:  05/22/2020 at 2:00pm.  [name] [place] Licensed Clinical Social Worker [place] [phone]

## 2020-05-16 ENCOUNTER — Encounter: Payer: Self-pay | Admitting: Physical Medicine & Rehabilitation

## 2020-05-16 ENCOUNTER — Other Ambulatory Visit: Payer: Self-pay

## 2020-05-16 ENCOUNTER — Encounter: Payer: Medicare Other | Attending: Physical Medicine & Rehabilitation | Admitting: Physical Medicine & Rehabilitation

## 2020-05-16 ENCOUNTER — Other Ambulatory Visit: Payer: Self-pay | Admitting: Nurse Practitioner

## 2020-05-16 VITALS — BP 153/98 | HR 85 | Temp 98.1°F | Ht 67.0 in | Wt 255.0 lb

## 2020-05-16 DIAGNOSIS — M47816 Spondylosis without myelopathy or radiculopathy, lumbar region: Secondary | ICD-10-CM | POA: Diagnosis not present

## 2020-05-16 NOTE — Patient Instructions (Signed)
You had a radio frequency procedure today This was done to alleviate joint pain in your lumbar area We injected lidocaine which is a local anesthetic.  You may experience soreness at the injection sites. You may also experienced some irritation of the nerves that were heated I'm recommending ice for 30 minutes every 2 hours as needed for the next 24-48 hours   

## 2020-05-16 NOTE — Progress Notes (Signed)
  PROCEDURE RECORD [facility]   Name: Danielle [name] DOB:Jul 17, 1945 MRN: [id number]  Date:05/16/2020  Physician: [name], MD    Nurse/CMA: Jorja Loa MA  Allergies:  Allergies  Allergen Reactions  . Lisinopril Cough  . Tetanus Toxoid Adsorbed Swelling    Arm swelling  . Other Cough    PEANUTS    Consent Signed: Yes.    Is patient diabetic? No.  CBG today? N/A Pregnant: No. LMP: No LMP recorded. Patient is postmenopausal. (age [age])  Anticoagulants: yes (Warfarin) Anti-inflammatory: no Antibiotics: no  Procedure: Right L3-L4, L5  Radiofrequency   Position: Prone Start Time: 12:44pm End Time:1:12pm  Fluoro Time:1.06  RN/CMA Carlie Corpus MA Kinnie Kaupp MA    Time 12:25pm 1:10pm    BP 153/98 135/81    Pulse 85 82    Respirations 18 18    O2 Sat 96 95    S/S 6 6    Pain Level 7/10 3/10     D/C home with Husband, [name] & O X 3, D/C instructions reviewed, and sits independently.

## 2020-05-16 NOTE — Progress Notes (Signed)
RightL5 dorsal ramus., Right L4 and Right L3 medial branch radio frequency neurotomy under fluoroscopic guidance   Indication: Low back pain due to lumbar spondylosis which has been relieved on 2 occasions by greater than 50% by lumbar medial branch blocks at corresponding levels.  Informed consent was obtained after describing risks and benefits of the procedure with the patient, this includes bleeding, bruising, infection, paralysis and medication side effects. The patient wishes to proceed and has given written consent. The patient was placed in a prone position. The lumbar and sacral area was marked and prepped with Betadine. A 25-gauge 1-1/2 inch needle was inserted into the skin and subcutaneous tissue at 3 sites in one ML of 1% lidocaine was injected into each site. Then a 18-gauge 15 cm radio frequency needle with a 1 cm curved active tip was inserted targeting the Right S1 SAP/sacral ala junction. Bone contact was made and confirmed with lateral imaging.  motor stimulation at 2 Hz confirm proper needle location followed by injection of 46m 2% MPF lidocaine. Then the Right L5 SAP/transverse process junction was targeted. Bone contact was made and confirmed with lateral imaging.  motor stimulation at 2 Hz confirm proper needle location followed by injection of 145m2% MPF lidocaine. Then the Right L4 SAP/transverse process junction was targeted. Bone contact was made and confirmed with lateral imaging. motor stimulation at 2 Hz confirm proper needle location followed by injection of 63m9m% MPF lidocaine. Radio frequency lesion [facility] 90 seconds was performed. Needles were removed. Post procedure instructions and vital signs were performed. Patient tolerated procedure well. Followup appointment was given.

## 2020-05-19 ENCOUNTER — Telehealth: Payer: Self-pay | Admitting: *Deleted

## 2020-05-19 LAB — TOXASSURE SELECT,+ANTIDEPR,UR

## 2020-05-19 NOTE — Telephone Encounter (Signed)
Urine drug screen for this encounter is consistent for prescribed medication 

## 2020-05-20 NOTE — Progress Notes (Signed)
Danielle [name]   Telephone:(336) [phone] [phone]   Clinic Follow up Note   Patient Care Team: [name], [name], MD as PCP - General (Family Medicine) End, Harrell Gave, MD as PCP - Cardiology (Cardiology) [name], MD as Consulting Physician (Hematology) Kyung Rudd, MD as Consulting Physician (Radiation Oncology) Rolm Bookbinder, MD as Consulting Physician (General Surgery) [name], Charlestine Massed, NP as Nurse Practitioner (Hematology and Oncology) Earnie Larsson, [facility] as Pharmacist (Pharmacist) Saporito, Maree Erie, LCSW as Social Worker (Licensed Clinical Social Worker) 05/21/2020  CHIEF COMPLAINT: Follow up left breast cancer   SUMMARY OF ONCOLOGIC HISTORY: Oncology History Overview Note  Cancer Staging Malignant neoplasm of upper-outer quadrant of left breast in female, estrogen receptor positive ([place]) Staging form: Breast, AJCC 8th Edition - Clinical stage from 12/13/2016: Stage IB (cT2, cN0, cM0, G2, ER: Positive, PR: Positive, HER2: Negative) - Signed by [name], MD on 12/21/2016 - Pathologic stage from 01/14/2017: Stage IA (pT2, pN0, cM0, G1, ER: Positive, PR: Positive, HER2: Negative, Oncotype DX score: 4) - Signed by [name], MD on 04/17/2017     Malignant neoplasm of upper-outer quadrant of left breast in female, estrogen receptor positive ([place])  12/06/2016 Mammogram   Diagnostic Mammogram 12/06/16 IMPRESSION:  Suspicious mass in the left breast at 3 o'clock 2 cm from the nipple measuring 1.9 x 1.1 x 2.2 cm. RECOMMENDATION: Ultrasound-guided core biopsy of the mass in the 3 o'clock region of the left breast is recommended. The biopsy will be scheduled at the patient's convenience.   12/13/2016 Initial Biopsy   Diagnosis 12/13/16 Breast, left, needle core biopsy, 3:00 o'clock, 2cmfn - INVASIVE DUCTAL CARCINOMA - SEE COMMENT   12/16/2016 Initial Diagnosis   Malignant neoplasm of upper-outer quadrant of left breast in female,  estrogen receptor positive ([place])   12/17/2016 Receptors her2   Estrogen Receptor: 100%, POSITIVE, STRONG STAINING INTENSITY Progesterone Receptor: 100%, POSITIVE, STRONG STAINING INTENSITY Proliferation Marker Ki67: 30% HER2 - NEGATIVE    01/14/2017 Surgery   Left breast lumpectomy with Dr. Donne Hazel   01/14/2017 Pathology Results   Diagnosis 01/14/17 1. Breast, lumpectomy, Left - INVASIVE DUCTAL CARCINOMA, GRADE I/III, SPANNING 2.1 CM. - DUCTAL CARCINOMA IN SITU, LOW GRADE. - INVASIVE CARCINOMA IS BROADLY PRESENT AT THE INFERIOR MARGIN OF SPECIMEN 1. - DUCTAL CARCINOMA IN SITU IS FOCALLY PRESENT AT THE INFERIOR MARGIN OF SPECIMEN 1 AND BROADLY LESS THAN 0.1 CM TO THE LATERAL MARGIN OF SPECIMEN 1. - SEE ONCOLOGY TABLE BELOW. 2. Breast, excision, Additional medial margin left - DUCTAL CARCINOMA IN SITU, LOW GRADE. - DUCTAL CARCINOMA IS FOCALLY LESS THAN 0.1 CM TO THE NEW MARGIN OF SPECIMEN 2. 3. Breast, excision, Additional lateral margin left - DUCTAL CARCINOMA IN SITU, LOW GRADE. - DUCTAL CARCINOMA IN SITU IS GREATER THAN 0.2 CM TO ALL MARGINS. 4. Breast, excision, Additional superior margin left - DUCTAL CARCINOMA IN SITU, LOW GRADE. - DUCTAL CARCINOMA IN SITU IS BROADLY LESS THAN 0.1 CM TO THE NEW MARGIN OF SPECIMEN 4. 5. Lymph node, sentinel, biopsy, Left axillary - THERE IS NO EVIDENCE OF CARCINOMA IN 1 OF 1 LYMPH NODE (0/1). 6. Breast, excision, Additional inferior margin left - DUCTAL CARCINOMA IN SITU, LOW GRADE. - DUCTAL CARCINOMA IN SITU IS GREATER THAN 0.2 CM TO ALL MARGINS.    01/14/2017 Oncotype testing   Her oncotype recurrence score is 4 and her distance recurrent on Tamoxifen alone is 3%.   03/08/2017 - 04/05/2017 Radiation Therapy   RT with Dr. Lisbeth Renshaw    06/2017 -  Anti-estrogen oral therapy   Tamoxifen daily   12/15/2017 Mammogram   12/15/2017 Mammogram IMPRESSION: New lumpectomy site left breast. No mammographic evidence of malignancy in either breast.      CURRENT THERAPY: Adjuvant Tamoxifen 20 mg daily starting 04/20/17  INTERVAL HISTORY: Danielle [name] returns accompanied by her spouse for follow up as scheduled. She was last seen in 10/2019. Mammogram 01/16/20 showed breast density cat c, post lumpectomy changes, and no evidence of malignancy.  She notes heaviness and intermittent left breast pain when she leans over on that side, denies new lump/mass, nipple discharge or inversion, or skin change.  She is tolerating tamoxifen with stable bone and joint pain and mild intermittent hot flashes.  She has intermittent left greater than right leg swelling, she is a CHF patient.  She has easy bruising but no bleeding.  Remains on same dose of Coumadin managed by PCP.  She has what she describes as a tingling sensation all over her body but more in the legs, no numbness.  She is not quite sure when this started.  She is not a diabetic.  She admits to feeling depressed lately secondary to losing her younger sister to renal failure recently and weight gain. Denies SI. She is interested in talking to someone. Would consider medication if needed in the future.    MEDICAL HISTORY:  Past Medical History:  Diagnosis Date  . Allergy   . Anxiety   . Aortic stenosis    Status post bioprosthetic AVR  . Arthritis    DJD  . Asthma   . Breast cancer ([place]) 12/13/2016   Left breast  . Chronic systolic CHF (congestive heart failure) ([place])    EF 30% 04/2014  . COPD (chronic obstructive pulmonary disease) ([place])   . Coronary artery disease    per pt, had LHC prior to AVR in [place] that did not show any blockages; no stents/bypass  . Gastritis   . Hyperlipidemia   . Hypertension   . Hyperthyroidism   . Multiple thyroid nodules   . Osteoporosis   . Paroxysmal atrial fibrillation (HCC)   . Pelvis fracture ([place]) 08/19/2015   MULTIPLE   . Personal history of radiation therapy 2018  . Spontaneous pneumothorax 11/28/2015   left   . Stroke ([place]) 2000    rt hand weak    SURGICAL HISTORY: Past Surgical History:  Procedure Laterality Date  . BREAST LUMPECTOMY Left 12/2016  . BREAST LUMPECTOMY WITH RADIOACTIVE SEED AND SENTINEL LYMPH NODE BIOPSY Left 01/14/2017   Procedure: LEFT BREAST LUMPECTOMY WITH RADIOACTIVE SEED AND SENTINEL LYMPH NODE BIOPSY;  Surgeon: Rolm Bookbinder, MD;  Location: [employer];  Service: General;  Laterality: Left;  . CHEST TUBE INSERTION  10/2015  . INTRAMEDULLARY (IM) NAIL INTERTROCHANTERIC Left 12/10/2015   Procedure: INTRAMEDULLARY (IM) NAIL INTERTROCHANTRIC;  Surgeon: Leandrew Koyanagi, MD;  Location: [place];  Service: Orthopedics;  Laterality: Left;  . PLEURADESIS Left 12/03/2015   Procedure: PLEURADESIS;  Surgeon: Melrose Nakayama, MD;  Location: [place];  Service: Thoracic;  Laterality: Left;  . RESECTION OF APICAL BLEB Left 12/03/2015   Procedure: BLEBECTOMY;  Surgeon: Melrose Nakayama, MD;  Location: [place];  Service: Thoracic;  Laterality: Left;  . RIGHT/LEFT HEART CATH AND CORONARY ANGIOGRAPHY N/A 03/10/2020   Procedure: RIGHT/LEFT HEART CATH AND CORONARY ANGIOGRAPHY;  Surgeon: [name], MD;  Location: [facility];  Service: Cardiovascular;  Laterality: N/A;  . THORACOSCOPY  12/03/2015  . VALVE REPLACEMENT    .  VIDEO ASSISTED THORACOSCOPY Left 12/03/2015   Procedure: VIDEO ASSISTED THORACOSCOPY;  Surgeon: Melrose Nakayama, MD;  Location: [facility];  Service: Thoracic;  Laterality: Left;    I have reviewed the social history and family history with the patient and they are unchanged from previous note.  ALLERGIES:  is allergic to lisinopril, tetanus toxoid adsorbed, and other.  MEDICATIONS:  Current Outpatient Medications  Medication Sig Dispense Refill  . albuterol (PROVENTIL HFA;VENTOLIN HFA) 108 (90 Base) MCG/ACT inhaler Inhale 2 puffs into the lungs every 6 (six) hours as needed for wheezing or shortness of breath.     Marland Kitchen alendronate (FOSAMAX) 70 MG tablet Take 1 tablet (70 mg total) by  mouth once a week. Take with a full glass of water on an empty stomach. 13 tablet 3  . ascorbic acid (VITAMIN C) 500 MG tablet Take 500 mg by mouth daily.    Marland Kitchen aspirin EC 81 MG tablet Take 81 mg daily by mouth.    Marland Kitchen atorvastatin (LIPITOR) 40 MG tablet Take 1 tablet by mouth daily.    . carvedilol (COREG) 12.5 MG tablet Take 1 tablet (12.5 mg total) by mouth 2 (two) times daily with a meal. 180 tablet 1  . Cholecalciferol 25 MCG (1000 UT) tablet Take 1,000 Units by mouth daily.    . diclofenac sodium (VOLTAREN) 1 % GEL Apply 4 g topically 4 (four) times daily. (Patient taking differently: Apply 4 g topically 4 (four) times daily as needed (pain).) 500 g 3  . diphenhydramine-acetaminophen (TYLENOL PM) 25-500 MG TABS tablet Take 2 tablets by mouth at bedtime.    . fluticasone (FLONASE) 50 MCG/ACT nasal spray Place 1 spray into both nostrils daily. 16 g 3  . furosemide (LASIX) 20 MG tablet Take 2 tablets (40 mg total) by mouth daily. 60 tablet 1  . lisinopril (ZESTRIL) 5 MG tablet Take 1 tablet by mouth daily.    Marland Kitchen MELATONIN PO Take 5 mg by mouth at bedtime.    . methimazole (TAPAZOLE) 5 MG tablet Take 3 tablets (15 mg total) by mouth daily. 270 tablet 2  . mometasone-formoterol (DULERA) 200-5 MCG/ACT AERO Inhale 2 puffs into the lungs 2 (two) times daily.    . pantoprazole (PROTONIX) 40 MG tablet Take 1 tablet (40 mg total) by mouth daily. 30 tablet 0  . rosuvastatin (CRESTOR) 40 MG tablet Take 1 tablet (40 mg total) by mouth daily. 90 tablet 1  . sacubitril-valsartan (ENTRESTO) 49-51 MG Take 0.5 tablets by mouth 2 (two) times daily. (Patient taking differently: Take 1 tablet by mouth 2 (two) times daily.) 90 tablet 1  . senna-docusate (SENOKOT-S) 8.6-50 MG tablet Take 2 tablets at bedtime by mouth.     . tamoxifen (NOLVADEX) 20 MG tablet Take 1 tablet by mouth once daily 90 tablet 0  . traMADol (ULTRAM) 50 MG tablet Take 1 tablet (50 mg total) by mouth 3 (three) times daily. 90 tablet 0  .  trolamine salicylate (ASPERCREME) 10 % cream Apply 1 application topically as needed for muscle pain.    . valACYclovir (VALTREX) 500 MG tablet Take 1 tablet (500 mg total) by mouth 2 (two) times daily. For 3 days when outbreaks. (Patient taking differently: Take 500 mg by mouth 2 (two) times daily as needed (For 3 days when outbreaks.).) 24 tablet 1  . warfarin (COUMADIN) 3 MG tablet TAKE AS DIRECTED BY ANTICOAGULATION CLINIC (Patient taking differently: Take 3 mg by mouth daily. TAKE AS DIRECTED BY  ANTICOAGULATION  CLINIC) 90  tablet 2   No current facility-administered medications for this visit.    PHYSICAL EXAMINATION:  Vitals:   05/21/20 0942  BP: 110/77  Pulse: 85  Resp: 18  Temp: 97.8 F (36.6 C)  SpO2: 100%   Filed Weights   05/21/20 0942  Weight: 251 lb (113.9 kg)    GENERAL:alert, no distress and comfortable SKIN: Scattered ecchymoses in the left groin and bilateral lower extremities.  No rash EYES:  sclera clear LYMPH:  no palpable cervical or supraclavicular lymphadenopathy  LUNGS:  normal breathing effort HEART: Symmetric lower extremity edema NEURO: alert & oriented x 3 with fluent speech Breast exam: Breasts are symmetric without nipple discharge or inversion.  S/p left lumpectomy and radiation.  Incisions completely healed.  Breast is mildly hyperpigmented and edematous, no palpable mass in either breast or axilla that I could appreciate  LABORATORY DATA:  I have reviewed the data as listed CBC Latest Ref Rng & Units 05/21/2020 03/10/2020 03/10/2020  WBC 4.0 - 10.5 K/uL 5.3 - -  Hemoglobin 12.0 - 15.0 g/dL 12.4 11.6(L) 11.9(L)  Hematocrit 36.0 - 46.0 % 38.2 34.0(L) 35.0(L)  Platelets 150 - 400 K/uL 95(L) - -     CMP Latest Ref Rng & Units 05/21/2020 03/19/2020 03/10/2020  Glucose 70 - 99 mg/dL 97 97 -  BUN 8 - 23 mg/dL 13 13 -  Creatinine 0.44 - 1.00 mg/dL 1.31(H) 1.10(H) -  Sodium 135 - 145 mmol/L 143 137 142  Potassium 3.5 - 5.1 mmol/L 4.9 4.7 4.0   Chloride 98 - 111 mmol/L 110 107 -  CO2 22 - 32 mmol/L 24 22 -  Calcium 8.9 - 10.3 mg/dL 9.9 9.8 -  Total Protein 6.5 - 8.1 g/dL 7.5 - -  Total Bilirubin 0.3 - 1.2 mg/dL 0.5 - -  Alkaline Phos 38 - 126 U/L 52 - -  AST 15 - 41 U/L 13(L) - -  ALT 0 - 44 U/L 11 - -      RADIOGRAPHIC STUDIES: I have personally reviewed the radiological images as listed and agreed with the findings in the report. No results found.   ASSESSMENT & PLAN:   1.Malignant neoplasm of upper-outer quadrant of left breast, Stage IB, c(T2,N0,M0 ), ER/PR: POSITIVE, HER2: NEGATIVE, Grade II. Oncotype 4 -s/p lumpectomy 12/2016 and adjuvant radiation 03/2017 - 04/2017, began tamoxifen 06/2017 and tolerating well. Plan for 5-10 years  -Continue tamoxifen and surveillance   2. CAD, Afib, CHF, Aortic Stenosis, H/o Stroke -On Coreg, Coumadin -clinically stable -INR closely monitored per PCP   Disposition: Danielle [name] is clinically doing well.  She tolerates tamoxifen.  Breast exam is benign, labs unremarkable.  Overall there is no clinical concern for breast cancer recurrence.  She will repeat mammogram with DEXA in 12/2020.  She has neuropathy of unknown etiology, I am referring her to neurology.  We discussed her depression, emotional support was provided.  I referred her to social work.  She will return for lab and routine surveillance visit in 6 months, or sooner if needed.   Orders Placed This Encounter  Procedures  . MM DIAG BREAST TOMO BILATERAL    Standing Status:   Future    Standing Expiration Date:   05/21/2021    Order Specific Question:   Reason for Exam (SYMPTOM  OR DIAGNOSIS REQUIRED)    Answer:   History of left breast cancer    Order Specific Question:   Preferred imaging location?    Answer:   [facility]  .  DG Bone Density    Standing Status:   Future    Standing Expiration Date:   05/21/2021    Order Specific Question:   Reason for Exam (SYMPTOM  OR DIAGNOSIS REQUIRED)     Answer:   osteoporosis    Order Specific Question:   Preferred imaging location?    Answer:   [facility]  . Ambulatory referral to Social Work    Referral Priority:   Routine    Referral Type:   Consultation    Referral Reason:   Specialty Services Required    Number of Visits Requested:   1  . Ambulatory referral to Neurology    Referral Priority:   Routine    Referral Type:   Consultation    Referral Reason:   Specialty Services Required    Requested Specialty:   Neurology    Number of Visits Requested:   1   All questions were answered. The patient knows to call the clinic with any problems, questions or concerns. No barriers to learning were detected.     Alla Feeling, NP 05/21/20

## 2020-05-21 ENCOUNTER — Telehealth: Payer: Self-pay | Admitting: Nurse Practitioner

## 2020-05-21 ENCOUNTER — Inpatient Hospital Stay: Payer: Medicare Other | Attending: Nurse Practitioner | Admitting: Nurse Practitioner

## 2020-05-21 ENCOUNTER — Encounter: Payer: Self-pay | Admitting: Nurse Practitioner

## 2020-05-21 ENCOUNTER — Inpatient Hospital Stay: Payer: Medicare Other

## 2020-05-21 ENCOUNTER — Other Ambulatory Visit: Payer: Self-pay

## 2020-05-21 VITALS — BP 110/77 | HR 85 | Temp 97.8°F | Resp 18 | Ht 67.0 in | Wt 251.0 lb

## 2020-05-21 DIAGNOSIS — E785 Hyperlipidemia, unspecified: Secondary | ICD-10-CM | POA: Diagnosis not present

## 2020-05-21 DIAGNOSIS — I480 Paroxysmal atrial fibrillation: Secondary | ICD-10-CM | POA: Insufficient documentation

## 2020-05-21 DIAGNOSIS — E042 Nontoxic multinodular goiter: Secondary | ICD-10-CM | POA: Diagnosis not present

## 2020-05-21 DIAGNOSIS — I350 Nonrheumatic aortic (valve) stenosis: Secondary | ICD-10-CM | POA: Diagnosis not present

## 2020-05-21 DIAGNOSIS — Z170 Estrogen receptor positive status [ER+]: Secondary | ICD-10-CM | POA: Insufficient documentation

## 2020-05-21 DIAGNOSIS — Z8673 Personal history of transient ischemic attack (TIA), and cerebral infarction without residual deficits: Secondary | ICD-10-CM | POA: Insufficient documentation

## 2020-05-21 DIAGNOSIS — Z79899 Other long term (current) drug therapy: Secondary | ICD-10-CM | POA: Insufficient documentation

## 2020-05-21 DIAGNOSIS — G629 Polyneuropathy, unspecified: Secondary | ICD-10-CM | POA: Insufficient documentation

## 2020-05-21 DIAGNOSIS — I509 Heart failure, unspecified: Secondary | ICD-10-CM | POA: Insufficient documentation

## 2020-05-21 DIAGNOSIS — Z923 Personal history of irradiation: Secondary | ICD-10-CM | POA: Diagnosis not present

## 2020-05-21 DIAGNOSIS — I2510 Atherosclerotic heart disease of native coronary artery without angina pectoris: Secondary | ICD-10-CM | POA: Insufficient documentation

## 2020-05-21 DIAGNOSIS — I5022 Chronic systolic (congestive) heart failure: Secondary | ICD-10-CM | POA: Diagnosis not present

## 2020-05-21 DIAGNOSIS — I4891 Unspecified atrial fibrillation: Secondary | ICD-10-CM | POA: Diagnosis not present

## 2020-05-21 DIAGNOSIS — J449 Chronic obstructive pulmonary disease, unspecified: Secondary | ICD-10-CM | POA: Insufficient documentation

## 2020-05-21 DIAGNOSIS — C50412 Malignant neoplasm of upper-outer quadrant of left female breast: Secondary | ICD-10-CM | POA: Insufficient documentation

## 2020-05-21 DIAGNOSIS — Z79810 Long term (current) use of selective estrogen receptor modulators (SERMs): Secondary | ICD-10-CM | POA: Insufficient documentation

## 2020-05-21 LAB — CMP (CANCER CENTER ONLY)
ALT: 11 U/L (ref 0–44)
AST: 13 U/L — ABNORMAL LOW (ref 15–41)
Albumin: 3.7 g/dL (ref 3.5–5.0)
Alkaline Phosphatase: 52 U/L (ref 38–126)
Anion gap: 9 (ref 5–15)
BUN: 13 mg/dL (ref 8–23)
CO2: 24 mmol/L (ref 22–32)
Calcium: 9.9 mg/dL (ref 8.9–10.3)
Chloride: 110 mmol/L (ref 98–111)
Creatinine: 1.31 mg/dL — ABNORMAL HIGH (ref 0.44–1.00)
GFR, Estimated: 43 mL/min — ABNORMAL LOW (ref >60)
Glucose, Bld: 97 mg/dL (ref 70–99)
Potassium: 4.9 mmol/L (ref 3.5–5.1)
Sodium: 143 mmol/L (ref 135–145)
Total Bilirubin: 0.5 mg/dL (ref 0.3–1.2)
Total Protein: 7.5 g/dL (ref 6.5–8.1)

## 2020-05-21 LAB — CBC WITH DIFFERENTIAL (CANCER CENTER ONLY)
Abs Immature Granulocytes: 0.01 10*3/uL (ref 0.00–0.07)
Basophils Absolute: 0 10*3/uL (ref 0.0–0.1)
Basophils Relative: 0 %
Eosinophils Absolute: 0.3 10*3/uL (ref 0.0–0.5)
Eosinophils Relative: 6 %
HCT: 38.2 % (ref 36.0–46.0)
Hemoglobin: 12.4 g/dL (ref 12.0–15.0)
Immature Granulocytes: 0 %
Lymphocytes Relative: 23 %
Lymphs Abs: 1.2 10*3/uL (ref 0.7–4.0)
MCH: 30.6 pg (ref 26.0–34.0)
MCHC: 32.5 g/dL (ref 30.0–36.0)
MCV: 94.3 fL (ref 80.0–100.0)
Monocytes Absolute: 0.5 10*3/uL (ref 0.1–1.0)
Monocytes Relative: 10 %
Neutro Abs: 3.2 10*3/uL (ref 1.7–7.7)
Neutrophils Relative %: 61 %
Platelet Count: 95 10*3/uL — ABNORMAL LOW (ref 150–400)
RBC: 4.05 MIL/uL (ref 3.87–5.11)
RDW: 14.1 % (ref 11.5–15.5)
WBC Count: 5.3 10*3/uL (ref 4.0–10.5)
nRBC: 0 % (ref 0.0–0.2)

## 2020-05-21 NOTE — Telephone Encounter (Signed)
Scheduled appt per 3/23 los - gave patient AVS and calender  

## 2020-05-22 ENCOUNTER — Telehealth: Payer: Self-pay | Admitting: *Deleted

## 2020-05-22 ENCOUNTER — Ambulatory Visit (INDEPENDENT_AMBULATORY_CARE_PROVIDER_SITE_OTHER): Payer: Medicare Other | Admitting: *Deleted

## 2020-05-22 ENCOUNTER — Encounter: Payer: Self-pay | Admitting: General Practice

## 2020-05-22 DIAGNOSIS — F321 Major depressive disorder, single episode, moderate: Secondary | ICD-10-CM | POA: Diagnosis not present

## 2020-05-22 DIAGNOSIS — N183 Chronic kidney disease, stage 3 unspecified: Secondary | ICD-10-CM | POA: Diagnosis not present

## 2020-05-22 DIAGNOSIS — J449 Chronic obstructive pulmonary disease, unspecified: Secondary | ICD-10-CM

## 2020-05-22 NOTE — Progress Notes (Signed)
[place] CSW Progress NOtes  Referral received from oncologist treatment team. Patient is in surveillance for breast cancer, has finished treatment.  She is struggling with grief after death of her younger sister last month.  Sister died at [facility] after being on kidney dialysis for 7 years.  Patient has lost twin brothers, parents and sister - she and her elder sister are last surviving family members.  She is struggling to process this recent death.  She is also dealing with weight gain which she is distressed by - states this has come while she has been under stress from sisters health decline and death.  Discussed grief counseling available through [facility], patient plans to call and request these services.  She also has had unsuccessful outreach calls from the embedded [facility] social worker from her PCP - asked CSW Saporito to call again and try to connect w patient.  Patient has other chronic health conditions which may be of concern.  Of note, patient screened as 0 on [facility] 2/9 n 05/16/20.  Depression appears to be concentrated around processing grief from recent death of sister, thus patient will benefit from specialized grief counseling.  Edwyna Shell, LCSW Clinical Social Worker Phone:  [phone]

## 2020-05-22 NOTE — Telephone Encounter (Cosign Needed)
  Chronic Care Management   Outreach Note  05/22/2020 Name: Shaquila Sigman MRN: [id number] DOB: 19-Jun-1945  Referred by: [name], [name], MD Reason for referral : Chronic Care Management (3rd Unsuccessful Initial Outreach Call Attempt.)   Third unsuccessful telephone outreach was attempted today. The patient was referred to the case management team for assistance with care management and care coordination. The patient's primary care provider has been notified of our unsuccessful attempts to make or maintain contact with the patient. The care management team is pleased to engage with this patient at any time in the future should he/she be interested in assistance from the care management team. A HIPAA compliant phone message was left on voicemail for the patient, providing contact information, and requesting a return call if she is interested in receiving social work services and resources.  Follow Up Plan: No follow-up required, unless a return call is received from patient, requesting assistance with social work services and resources.  [name] LCSW Licensed Clinical Social Worker [place] [phone]

## 2020-05-22 NOTE — Patient Instructions (Addendum)
Visit Information  PATIENT GOALS:  Goals Addressed   None     Consent to CCM Services: Danielle [name] was given information about Chronic Care Management services today including:  1. CCM service includes personalized support from designated clinical staff supervised by her physician, including individualized plan of care and coordination with other care providers 2. 24/7 contact phone numbers for assistance for urgent and routine care needs. 3. Service will only be billed when office clinical staff spend 20 minutes or more in a month to coordinate care. 4. Only one practitioner may furnish and bill the service in a calendar month. 5. The patient may stop CCM services at any time (effective at the end of the month) by phone call to the office staff. 6. The patient will be responsible for cost sharing (co-pay) of up to 20% of the service fee (after annual deductible is met).  Patient agreed to services and verbal consent obtained.   Patient verbalizes understanding of instructions provided today and agrees to view in [place].   Telephone follow up appointment with care management team member scheduled for:  05/26/2020 at 11:00am.  [name] LCSW Licensed Clinical Social Worker [facility] [phone]  CLINICAL CARE PLAN: Patient Care Plan: Heart Failure (Adult)    Problem Identified: Symptom Exacerbation (Heart Failure)   Priority: Medium    Goal: Symptom Exacerbation Prevented or Minimized   Start Date: 03/13/2020  Expected End Date: 06/27/2020  This Visit's Progress: On track  Recent Progress: On track  Priority: Medium  Note:   Evidence-based guidance:  Perform or review cognitive and/or health literacy screening.  Assess understanding of adherence and barriers to treatment plan, as well as lifestyle changes; develop strategies to address barriers.  Establish a mutually-agreed-upon early intervention process to communicate with primary care provider  when signs/symptoms worsen.  Facilitate timely posthospital discharge  with follow-up via telephone calls, home visit, telehealth monitoring. Facilitate timely visit, usually within 1 week, with primary care provider following hospital discharge.  Review immunization status. Notes:    Task: Identify and Minimize Risk of Heart Failure Exacerbation   Due Date: 06/27/2020  Priority: Routine  Note:   Care Management Activities:    - barriers to lifestyle changes reviewed and addressed - barriers to treatment reviewed and addressed - cognitive screening completed and reviewed - depression screen reviewed - health literacy screening completed or reviewed - healthy lifestyle promoted - in-home support services arranged - medication-adherence assessment completed - rescue (action) plan developed - rescue (action) plan reviewed - self-awareness of signs/symptoms of worsening disease encouraged    Notes:  3/10-Pt reports she has been wearing her compression stockings at night during sleep. Strongly encourage pt to only wear her compression stockings during the day when she is mobile not during the night when sleeping. Verified pt remains in the GREEN zone with an acute symptoms or swelling related to her CHF. Pt requested dietary information to assist with her unhealthy snacking and eating habits. Will send emmi and requested a dietitian referral from her provider's office.  Feb- pt adherent to all her medications and continue to follow the plan of care related to HF. Denies any symptoms with no retention of fluid (GREEN zone). Due to a recent heart catheretization will extend to allow ongoing adherence.   Problem Identified: Disease Progression (Heart Failure)   Priority: Medium    Long-Range Goal: Comorbidities Identified and Managed   Start Date: 03/13/2020  Expected End Date: 06/27/2020  This Visit's Progress: On track  Recent Progress: On track  Priority: Medium  Note:   Evidence-based  guidance:  Assess and address signs/symptoms of comorbidity. Notes:  Educate on how daily monitoring is necessary in managing her other medical conditions.   Task: Identify and Manage Comorbidities   Due Date: 06/27/2020  Priority: Routine  Note:   Care Management Activities:    - medication side effects monitored and managed - signs/symptoms of comorbidities identified    Notes: Feb- Confirmed pt adherent with all her medications with no signs/symptoms presented as barriers at this time.

## 2020-05-22 NOTE — Chronic Care Management (AMB) (Signed)
Chronic Care Management    Clinical Social Work Note  05/22/2020 Name: Danielle [name] MRN: [id number] DOB: 1945-03-16  Danielle [name] is a 75 y.o. year old female who is a primary care patient of [name], [name], MD. The CCM team was consulted to assist the patient with chronic disease management and/or care coordination needs related to: [place] and Resources for symptoms of Depression and Grief and Loss.    LCSW received a message from Edwyna Shell, [place] with [facility], indicating that she was on the phone with patient, encouraging LCSW to contact patient after they terminated their call, as she realized that LCSW was having a difficult time trying to make initial contact with patient.  Engaged with patient by telephone for initial visit in response to provider referral for social work chronic care management and care coordination services.  Patient and LCSW agreed to begin counseling and supportive services on 05/26/2020 at 11:00am.  In the meantime, patient is aware that she will be receiving information from Ms. Cunningham with regards to Grief and [employer], offered through [employer].  Consent to Services:  The patient was given information about Chronic Care Management services, agreed to services, and gave verbal consent prior to initiation of services.  Please see initial visit note for detailed documentation.   Patient agreed to services and consent obtained.   Assessment: Review of patient past medical history, allergies, medications, and health status, including review of relevant consultants reports was performed today as part of a comprehensive evaluation and provision of chronic care management and care coordination services.     SDOH (Social Determinants of Health) assessments and interventions performed:    Advanced Directives Status: Not addressed in this encounter.  CCM Care  Plan  Allergies  Allergen Reactions  . Lisinopril Cough  . Tetanus Toxoid Adsorbed Swelling    Arm swelling  . Other Cough    PEANUTS    Outpatient Encounter Medications as of 05/22/2020  Medication Sig Note  . albuterol (PROVENTIL HFA;VENTOLIN HFA) 108 (90 Base) MCG/ACT inhaler Inhale 2 puffs into the lungs every 6 (six) hours as needed for wheezing or shortness of breath.    Marland Kitchen alendronate (FOSAMAX) 70 MG tablet Take 1 tablet (70 mg total) by mouth once a week. Take with a full glass of water on an empty stomach. 03/04/2020: Saturday  . ascorbic acid (VITAMIN C) 500 MG tablet Take 500 mg by mouth daily.   Marland Kitchen aspirin EC 81 MG tablet Take 81 mg daily by mouth.   Marland Kitchen atorvastatin (LIPITOR) 40 MG tablet Take 1 tablet by mouth daily.   . carvedilol (COREG) 12.5 MG tablet Take 1 tablet (12.5 mg total) by mouth 2 (two) times daily with a meal.   . Cholecalciferol 25 MCG (1000 UT) tablet Take 1,000 Units by mouth daily.   . diclofenac sodium (VOLTAREN) 1 % GEL Apply 4 g topically 4 (four) times daily. (Patient taking differently: Apply 4 g topically 4 (four) times daily as needed (pain).)   . diphenhydramine-acetaminophen (TYLENOL PM) 25-500 MG TABS tablet Take 2 tablets by mouth at bedtime.   . fluticasone (FLONASE) 50 MCG/ACT nasal spray Place 1 spray into both nostrils daily.   . furosemide (LASIX) 20 MG tablet Take 2 tablets (40 mg total) by mouth daily.   Marland Kitchen lisinopril (ZESTRIL) 5 MG tablet Take 1 tablet by mouth daily.   Marland Kitchen MELATONIN PO Take 5 mg by mouth at bedtime.   . methimazole (TAPAZOLE)  5 MG tablet Take 3 tablets (15 mg total) by mouth daily.   . mometasone-formoterol (DULERA) 200-5 MCG/ACT AERO Inhale 2 puffs into the lungs 2 (two) times daily.   . pantoprazole (PROTONIX) 40 MG tablet Take 1 tablet (40 mg total) by mouth daily.   . rosuvastatin (CRESTOR) 40 MG tablet Take 1 tablet (40 mg total) by mouth daily.   . sacubitril-valsartan (ENTRESTO) 49-51 MG Take 0.5 tablets by mouth 2 (two)  times daily. (Patient taking differently: Take 1 tablet by mouth 2 (two) times daily.)   . senna-docusate (SENOKOT-S) 8.6-50 MG tablet Take 2 tablets at bedtime by mouth.    . tamoxifen (NOLVADEX) 20 MG tablet Take 1 tablet by mouth once daily   . traMADol (ULTRAM) 50 MG tablet Take 1 tablet (50 mg total) by mouth 3 (three) times daily. 05/16/2020: On hand #33 Last taken today Last filled on  04/02/20  for  90  . trolamine salicylate (ASPERCREME) 10 % cream Apply 1 application topically as needed for muscle pain.   . valACYclovir (VALTREX) 500 MG tablet Take 1 tablet (500 mg total) by mouth 2 (two) times daily. For 3 days when outbreaks. (Patient taking differently: Take 500 mg by mouth 2 (two) times daily as needed (For 3 days when outbreaks.).)   . warfarin (COUMADIN) 3 MG tablet TAKE AS DIRECTED BY ANTICOAGULATION CLINIC (Patient taking differently: Take 3 mg by mouth daily. TAKE AS DIRECTED BY  ANTICOAGULATION  CLINIC)    No facility-administered encounter medications on file as of 05/22/2020.    Patient Active Problem List   Diagnosis Date Noted  . Pneumonia due to COVID-19 virus   . Bacteremia due to Gram-positive bacteria 08/29/2018  . Abscess   . Cellulitis of left breast 08/28/2018  . Cellulitis of left abdominal wall 08/28/2018  . Encounter for routine gynecological examination 04/28/2018  . CKD (chronic kidney disease), stage III ([place]) 09/27/2017  . Recurrent genital herpes 04/13/2017  . Chronic pain disorder 01/25/2017  . Malignant neoplasm of upper-outer quadrant of left breast in female, estrogen receptor positive ([place]) 12/16/2016  . Chronic knee pain 12/07/2016  . Chronic bilateral low back pain without sciatica 12/07/2016  . Peripheral musculoskeletal gait disorder 12/07/2016  . Encounter for therapeutic drug monitoring 06/16/2016  . Generalized osteoarthritis of multiple sites 05/04/2016  . Chronic anticoagulation 05/04/2016  . Stroke ([place]) 04/14/2016  . Aortic valve  disease 04/10/2016  . S/P AVR 04/10/2016  . NICM (nonischemic cardiomyopathy) ([place]) 04/10/2016  . Upper airway cough syndrome 03/14/2016  . Morbid obesity ([place]) 03/14/2016  . COPD (chronic obstructive pulmonary disease) ([place]) 03/11/2016  . Thrombocytopenia ([place]) 12/12/2015  . Anemia 12/12/2015  . Vitamin D deficiency 12/12/2015  . Acute urinary retention 12/11/2015  . Osteoporosis 12/11/2015  . Hip fracture ([place]) 12/10/2015  . Aortic atherosclerosis ([place]) 12/10/2015  . Lung blebs ([place]) 12/03/2015  . Pelvic fracture ([place]) 08/19/2015  . Fall at home 08/19/2015  . Essential hypertension 08/19/2015  . Mixed hyperlipidemia 08/19/2015  . Asthma 08/19/2015  . Paroxysmal atrial fibrillation ([place]) 08/19/2015  . Hyperthyroidism 08/19/2015  . Chronic HFrEF (heart failure with reduced ejection fraction) ([place])   . Coronary artery disease   . Aortic stenosis     Conditions to be addressed/monitored: Depression, Grief and Loss, CKD Stage III, Chronic Pain Disorder, COPD.  Mental Health Concerns related to Depression, Grief and Loss over recent death of sister.  There are no care plans that you recently modified to display for this patient.  Follow Up Plan: 05/26/2020 at 11:00am  [place] Licensed Clinical Social Worker [place] [phone]

## 2020-05-23 ENCOUNTER — Telehealth: Payer: Self-pay | Admitting: Internal Medicine

## 2020-05-23 ENCOUNTER — Ambulatory Visit: Payer: Medicare Other | Admitting: Internal Medicine

## 2020-05-23 ENCOUNTER — Other Ambulatory Visit
Admission: RE | Admit: 2020-05-23 | Discharge: 2020-05-23 | Disposition: A | Discharge status: Home / Self Care | Payer: Medicare Other | Attending: Internal Medicine | Admitting: Internal Medicine

## 2020-05-23 ENCOUNTER — Encounter: Payer: Self-pay | Admitting: Internal Medicine

## 2020-05-23 ENCOUNTER — Other Ambulatory Visit: Payer: Self-pay

## 2020-05-23 VITALS — BP 100/70 | HR 82 | Ht 67.0 in | Wt 248.4 lb

## 2020-05-23 DIAGNOSIS — Z952 Presence of prosthetic heart valve: Secondary | ICD-10-CM | POA: Diagnosis not present

## 2020-05-23 DIAGNOSIS — I5022 Chronic systolic (congestive) heart failure: Secondary | ICD-10-CM | POA: Diagnosis not present

## 2020-05-23 DIAGNOSIS — Z7901 Long term (current) use of anticoagulants: Secondary | ICD-10-CM | POA: Diagnosis not present

## 2020-05-23 DIAGNOSIS — I480 Paroxysmal atrial fibrillation: Secondary | ICD-10-CM | POA: Diagnosis not present

## 2020-05-23 DIAGNOSIS — Z5181 Encounter for therapeutic drug level monitoring: Secondary | ICD-10-CM | POA: Insufficient documentation

## 2020-05-23 DIAGNOSIS — I428 Other cardiomyopathies: Secondary | ICD-10-CM

## 2020-05-23 LAB — PROTIME-INR
INR: 1.1 (ref 0.8–1.2)
Prothrombin Time: 14.2 seconds (ref 11.4–15.2)

## 2020-05-23 NOTE — Patient Instructions (Signed)
Medication Instructions:  Your physician recommends that you continue on your current medications as directed. Please refer to the Current Medication list given to you today. *If you need a refill on your cardiac medications before your next appointment, please call your pharmacy*   Lab Work: Your physician recommends that you return for lab work in: TODAY for INR.  - Please go to the [facility]. You will check in at the front desk to the right as you walk into the atrium. Valet Parking is offered if needed. - No appointment needed. You may go any day between 7 am and 6 pm.  If you have labs (blood work) drawn today and your tests are completely normal, you will receive your results only by: Marland Kitchen MyChart Message (if you have MyChart) OR . A paper copy in the mail If you have any lab test that is abnormal or we need to change your treatment, we will call you to review the results.   Testing/Procedures: none  Follow-Up: At [facility], you and your health needs are our priority.  As part of our continuing mission to provide you with exceptional heart care, we have created designated Provider Care Teams.  These Care Teams include your primary Cardiologist (physician) and Advanced Practice Providers (APPs -  Physician Assistants and Nurse Practitioners) who all work together to provide you with the care you need, when you need it.  We recommend signing up for the patient portal called "MyChart".  Sign up information is provided on this After Visit Summary.  MyChart is used to connect with patients for Virtual Visits (Telemedicine).  Patients are able to view lab/test results, encounter notes, upcoming appointments, etc.  Non-urgent messages can be sent to your provider as well.   To learn more about what you can do with MyChart, go to [email or web address].    Your next appointment:   2 month(s)  The format for your next appointment:   In Person  Provider:   You may see  [name], MD or one of the following Advanced Practice Providers on your designated Care Team:    Murray Hodgkins, NP  Christell Faith, PA-C  Marrianne Mood, PA-C  Cadence Manito, [place]  Laurann [place], NP

## 2020-05-23 NOTE — Progress Notes (Signed)
Follow-up Outpatient Visit Date: 05/23/2020  Primary Care Provider: [name], [name], MD [address] [place] [address]  Chief Complaint: Shortness of breath and back pain  HPI:  Danielle [name] is a 75 y.o. female with history of aortic valve disease (severe regurgitation by her description) s/p bioprosthetic aortic valve replacement in [place] in 03/2014, NICM with LVEF as low as 30-35% by report in 03/2015, paroxysmal atrial fibrillation on chronic warfarin, stroke x 2, hyperlipidemia, hypertension, hyperthyroidism,left breast cancer,spontaneous pneumothorax, and left hip fracture, who presents for follow-up of valvular heart disease and heart failure.  I last saw her about 2 months ago following catheterization that showed no significant CAD and mildly elevated left and right heart filling pressures.  Despite escalation of furosemide, Danielle [name] did not feel much better at our follow-up visit.  We did not make any medication changes.  Danielle [name] was in the process of beginning physical therapy to help with her deconditioning.  However, her physical therapy has been put on hold due to back pain and injections that have not helped much.  From heart standpoint, Danielle [name] feels about the same.  She still has exertional dyspnea with minimal activity.  Chronic leg swelling, particularly on the left, is unchanged.  She denies chest pain and palpitations.  She cut her furosemide down to 20 mg daily because she felt like 40 mg daily was drying her out too much.  She noticed some bruising on her left thigh a couple of days ago.  She denies trauma.  Her INR was supratherapeutic on last check at the beginning of this month.  She held warfarin for 2 days but otherwise instructed to go back to her usual dose.  She is awaiting consultation with a neurologist due to paresthesias and restless feeling in her legs.  She continues to walk with a walker.  She has not  fallen.  --------------------------------------------------------------------------------------------------  Cardiovascular History & Procedures: Cardiovascular Problems:  Aortic valve disease status post bioprosthetic AVR in 03/2014  Non-ischemic cardiomyopathy  Paroxysmal atrial fibrillation  Stroke  Risk Factors:  Hypertension, hyperlipidemia, stroke, and age > 48  Cath/PCI:  R/LHC (03/10/2020): No angiographically significant coronary artery disease.  Mildly elevated left heart, right heart, and PA pressures.  Normal Fick cardiac output/index.  No significant AVR gradient.  R/LHC (02/12/2014, [place]): Moderate proximal LAD calcification with mild, nonobstructive disease. Mildly reduced LVEF with severe apical hypokinesis. Normal pulmonary artery pressure.  CV Surgery:  Bioprosthetic aortic valve replacement (03/12/14, [place], [place], [place])  EP Procedures and Devices:  None  Non-Invasive Evaluation(s):  TTE (11/21/2019): Normal LV size with mild LVH. LVEF 45-50% with global hypokinesis. Normal RV size and function. Bioprosthetic aortic valve present with appropriate function (mean gradient 10 mmHg).  TTE (01/18/2019): Normal LV size with LVEF 35-40%. Mild LVH. Grade 1 diastolic dysfunction. Is in function. Bioprosthetic aortic valve with normal function. Normal PA pressure.  TTE (04/21/16): Normal obese size with moderate LVH. LVEF 35-40% with mid and apical anteroseptal hypokinesis. Grade 3 diastolic dysfunction noted. Aortic valve bioprosthesis present with a mean gradient of 11 mmHg. Mitral annular calcification noted. Normal RV size and function. Mild right atrial enlargement.  TTE (03/27/15, OSH): Mild LVH with LVEF 30-35%, mild left atrial enlargement, AVR in place without regurgitation.  TTE (05/10/14, OSH): LVEF 45-50%.   Recent CV Pertinent Labs: Lab Results  Component Value Date   CHOL 161 01/29/2019   HDL 64.90 01/29/2019    LDLCALC 81 01/29/2019   TRIG  77.0 01/29/2019   CHOLHDL 2 01/29/2019   INR 4.3 (A) 05/05/2020   INR 1.4 (H) 03/19/2020   BNP 93.2 12/21/2019   K 4.9 05/21/2020   K 4.3 12/22/2016   MG 1.9 12/22/2019   BUN 13 05/21/2020   BUN 11 02/27/2020   BUN 19.5 12/22/2016   CREATININE 1.31 (H) 05/21/2020   CREATININE 1.09 (H) 12/31/2019   CREATININE 1.2 (H) 12/22/2016    Past medical and surgical history were reviewed and updated in EPIC.  Current Meds  Medication Sig  . albuterol (PROVENTIL HFA;VENTOLIN HFA) 108 (90 Base) MCG/ACT inhaler Inhale 2 puffs into the lungs every 6 (six) hours as needed for wheezing or shortness of breath.   Marland Kitchen alendronate (FOSAMAX) 70 MG tablet Take 1 tablet (70 mg total) by mouth once a week. Take with a full glass of water on an empty stomach.  Marland Kitchen ascorbic acid (VITAMIN C) 500 MG tablet Take 500 mg by mouth daily.  Marland Kitchen aspirin EC 81 MG tablet Take 81 mg daily by mouth.  Marland Kitchen atorvastatin (LIPITOR) 40 MG tablet Take 1 tablet by mouth daily.  . carvedilol (COREG) 25 MG tablet Take 25 mg by mouth 2 (two) times daily with a meal.  . Cholecalciferol 25 MCG (1000 UT) tablet Take 1,000 Units by mouth daily.  . diclofenac sodium (VOLTAREN) 1 % GEL Apply 4 g topically 4 (four) times daily.  . diphenhydramine-acetaminophen (TYLENOL PM) 25-500 MG TABS tablet Take 2 tablets by mouth at bedtime.  . fluticasone (FLONASE) 50 MCG/ACT nasal spray Place 1 spray into both nostrils daily.  . furosemide (LASIX) 20 MG tablet Take 2 tablets (40 mg total) by mouth daily.  Marland Kitchen MELATONIN PO Take 5 mg by mouth at bedtime.  . methimazole (TAPAZOLE) 5 MG tablet Take 3 tablets (15 mg total) by mouth daily.  . mometasone-formoterol (DULERA) 200-5 MCG/ACT AERO Inhale 2 puffs into the lungs 2 (two) times daily.  . pantoprazole (PROTONIX) 40 MG tablet Take 1 tablet (40 mg total) by mouth daily.  . rosuvastatin (CRESTOR) 40 MG tablet Take 1 tablet (40 mg total) by mouth daily.  . sacubitril-valsartan  (ENTRESTO) 49-51 MG Take 1 tablet by mouth 2 (two) times daily.  Marland Kitchen senna-docusate (SENOKOT-S) 8.6-50 MG tablet Take 2 tablets at bedtime by mouth.   . tamoxifen (NOLVADEX) 20 MG tablet Take 1 tablet by mouth once daily  . traMADol (ULTRAM) 50 MG tablet Take 1 tablet (50 mg total) by mouth 3 (three) times daily.  Marland Kitchen trolamine salicylate (ASPERCREME) 10 % cream Apply 1 application topically as needed for muscle pain.  . valACYclovir (VALTREX) 500 MG tablet Take 1 tablet (500 mg total) by mouth 2 (two) times daily. For 3 days when outbreaks. (Patient taking differently: Take 500 mg by mouth 2 (two) times daily as needed. For 3 days when outbreaks.)  . warfarin (COUMADIN) 3 MG tablet TAKE AS DIRECTED BY ANTICOAGULATION CLINIC (Patient taking differently: Take 3 mg by mouth daily. TAKE AS DIRECTED BY  ANTICOAGULATION  CLINIC)  . [DISCONTINUED] lisinopril (ZESTRIL) 5 MG tablet Take 1 tablet by mouth daily.    Allergies: Lisinopril, Tetanus toxoid adsorbed, and Other  Social History   Tobacco Use  . Smoking status: Former Smoker    Packs/day: 0.25    Years: 10.00    Pack years: 2.50    Types: Cigarettes    Quit date: 03/01/2010    Years since quitting: 10.2  . Smokeless tobacco: Never Used  Vaping Use  .  Vaping Use: Never used  Substance Use Topics  . Alcohol use: No  . Drug use: No    Family History  Problem Relation Age of Onset  . Diabetes Mother   . Heart attack Mother 33  . Diabetes Father   . Lung cancer Father   . Diabetes Sister   . Thyroid disease Sister   . Diabetes Sister   . HIV Brother     Review of Systems: A 12-system review of systems was performed and was negative except as noted in the HPI.  --------------------------------------------------------------------------------------------------  Physical Exam: BP 100/70 (BP Location: Right Arm, Patient Position: Sitting, Cuff Size: Large)   Pulse 82   Ht 5\' 7"  (1.702 m)   Wt 248 lb 6 oz (112.7 kg)   SpO2 98%    BMI 38.90 kg/m   General:  NAD. Neck: No JVD or HJR, though body habitus limits evaluation. Lungs: Mildly diminished breath sounds throughout without wheezes or crackles. Heart: Regular rate and rhythm with 1/6 systolic murmur.  No rubs or gallops. Abdomen: Soft, nontender, nondistended. Extremities: 1+ left ankle edema.  EKG: Normal sinus rhythm with inferior and anterolateral infarct.  No significant change from prior tracing on 02/27/2020.  Lab Results  Component Value Date   WBC 5.3 05/21/2020   HGB 12.4 05/21/2020   HCT 38.2 05/21/2020   MCV 94.3 05/21/2020   PLT 95 (L) 05/21/2020    Lab Results  Component Value Date   NA 143 05/21/2020   K 4.9 05/21/2020   CL 110 05/21/2020   CO2 24 05/21/2020   BUN 13 05/21/2020   CREATININE 1.31 (H) 05/21/2020   GLUCOSE 97 05/21/2020   ALT 11 05/21/2020    Lab Results  Component Value Date   CHOL 161 01/29/2019   HDL 64.90 01/29/2019   LDLCALC 81 01/29/2019   TRIG 77.0 01/29/2019   CHOLHDL 2 01/29/2019    --------------------------------------------------------------------------------------------------  ASSESSMENT AND PLAN: Chronic HFrEF due to nonischemic cardiomyopathy: Patient continues to complain of significant fatigue and exertional dyspnea with minimal activity consistent with NYHA class III heart failure.  She still has some leg ankle edema with recent right heart catheterization showing mildly elevated filling pressures.  She decreased furosemide back to 20 mg daily on her own because she felt like 40 mg daily was drying her out too much.  I think it is reasonable to continue her current medications.  I believe that her obesity and deconditioning are playing a large part in her exertional dyspnea.  I encouraged her to try working with physical therapy again in an effort to improve her stamina.  We discussed referral to the advanced heart failure clinic, though we will defer this in favor of trying to improve her  functional capacity.  Continued soft blood pressure precludes escalation of GDMT.  Status post AVR: Continue aspirin and warfarin.  No evidence of valve failure by recent echo and catheterization.  Paroxysmal atrial fibrillation and chronic anticoagulation: EKG again shows sinus rhythm today.  Danielle [name] will remain on indefinite anticoagulation with warfarin.  Given unexplained bruising of the left thigh and recently supratherapeutic INR, I will check an INR today.  Ongoing management of her anticoagulation per Dr. [name].  Follow-up: Return to clinic in 2 months.  [name], MD 05/23/2020 9:33 AM

## 2020-05-23 NOTE — Telephone Encounter (Signed)
Attempted to call the patient. No answer- I left a message to please call back.  

## 2020-05-23 NOTE — Telephone Encounter (Signed)
[name], MD  05/23/2020 12:49 PM EDT      Please let [name] know that her INR is subtherapeutic at 1.1. She should continue to take warfarin as directed by Dr. [name]. I will forward the results to Dr. [name] for further adjustment of her warfarin, as she manages [name]'s chronic anticoagulation.

## 2020-05-26 ENCOUNTER — Ambulatory Visit: Payer: Medicare Other | Admitting: *Deleted

## 2020-05-26 ENCOUNTER — Encounter: Payer: Self-pay | Admitting: *Deleted

## 2020-05-26 DIAGNOSIS — J449 Chronic obstructive pulmonary disease, unspecified: Secondary | ICD-10-CM

## 2020-05-26 DIAGNOSIS — G894 Chronic pain syndrome: Secondary | ICD-10-CM

## 2020-05-26 DIAGNOSIS — N183 Chronic kidney disease, stage 3 unspecified: Secondary | ICD-10-CM

## 2020-05-26 DIAGNOSIS — F4381 Prolonged grief disorder: Secondary | ICD-10-CM

## 2020-05-26 DIAGNOSIS — F321 Major depressive disorder, single episode, moderate: Secondary | ICD-10-CM

## 2020-05-26 DIAGNOSIS — F4329 Adjustment disorder with other symptoms: Secondary | ICD-10-CM

## 2020-05-26 NOTE — Telephone Encounter (Signed)
Attempted to reach the patient. [email or web address].

## 2020-05-26 NOTE — Chronic Care Management (AMB) (Signed)
Chronic Care Management    Clinical Social Work Note  05/26/2020 Name: Danielle [name] MRN: [id number] DOB: 02/15/1946  Danielle [name] is a 75 y.o. year old female who is a primary care patient of [name], [name], MD. The CCM team was consulted to assist the patient with chronic disease management and/or care coordination needs related to: Mental Health Concerns regarding reduction and management of symptoms of Depression and Grief, due to recent loss of sister.  Engaged with patient by telephone for initial visit in response to provider referral for social work chronic care management and care coordination services.   Consent to Services:  The patient was given information about Chronic Care Management services, agreed to services, and gave verbal consent prior to initiation of services.  Please see initial visit note for detailed documentation.   Patient agreed to services and consent obtained.   Assessment: Review of patient past medical history, allergies, medications, and health status, including review of relevant consultants reports was performed today as part of a comprehensive evaluation and provision of chronic care management and care coordination services.     SDOH (Social Determinants of Health) assessments and interventions performed:  SDOH Interventions   Flowsheet Row Most Recent Value  SDOH Interventions   Food Insecurity Interventions Intervention Not Indicated  Financial Strain Interventions Intervention Not Indicated  Housing Interventions Intervention Not Indicated  Intimate Partner Violence Interventions Intervention Not Indicated  Physical Activity Interventions Intervention Not Indicated  Stress Interventions Offered [employer] Resources, Provide Counseling, Other (Comment)  [Provide counseling resources.]  Social Connections Interventions Intervention Not Indicated  Transportation Interventions Intervention Not Indicated  Alcohol  Brief Interventions/Follow-up AUDIT Score <7 follow-up not indicated       Advanced Directives Status: Not ready or willing to discuss.  CCM Care Plan  Allergies  Allergen Reactions  . Lisinopril Cough  . Tetanus Toxoid Adsorbed Swelling    Arm swelling  . Other Cough    PEANUTS    Outpatient Encounter Medications as of 05/26/2020  Medication Sig Note  . albuterol (PROVENTIL HFA;VENTOLIN HFA) 108 (90 Base) MCG/ACT inhaler Inhale 2 puffs into the lungs every 6 (six) hours as needed for wheezing or shortness of breath.    Marland Kitchen alendronate (FOSAMAX) 70 MG tablet Take 1 tablet (70 mg total) by mouth once a week. Take with a full glass of water on an empty stomach. 03/04/2020: Saturday  . ascorbic acid (VITAMIN C) 500 MG tablet Take 500 mg by mouth daily.   Marland Kitchen aspirin EC 81 MG tablet Take 81 mg daily by mouth.   Marland Kitchen atorvastatin (LIPITOR) 40 MG tablet Take 1 tablet by mouth daily.   . carvedilol (COREG) 25 MG tablet Take 25 mg by mouth 2 (two) times daily with a meal.   . Cholecalciferol 25 MCG (1000 UT) tablet Take 1,000 Units by mouth daily.   . diclofenac sodium (VOLTAREN) 1 % GEL Apply 4 g topically 4 (four) times daily.   . diphenhydramine-acetaminophen (TYLENOL PM) 25-500 MG TABS tablet Take 2 tablets by mouth at bedtime.   . fluticasone (FLONASE) 50 MCG/ACT nasal spray Place 1 spray into both nostrils daily.   . furosemide (LASIX) 20 MG tablet Take 2 tablets (40 mg total) by mouth daily.   Marland Kitchen MELATONIN PO Take 5 mg by mouth at bedtime.   . methimazole (TAPAZOLE) 5 MG tablet Take 3 tablets (15 mg total) by mouth daily.   . mometasone-formoterol (DULERA) 200-5 MCG/ACT AERO Inhale 2 puffs into the lungs 2 (two)  times daily.   . pantoprazole (PROTONIX) 40 MG tablet Take 1 tablet (40 mg total) by mouth daily.   . rosuvastatin (CRESTOR) 40 MG tablet Take 1 tablet (40 mg total) by mouth daily.   . sacubitril-valsartan (ENTRESTO) 49-51 MG Take 1 tablet by mouth 2 (two) times daily.   Marland Kitchen  senna-docusate (SENOKOT-S) 8.6-50 MG tablet Take 2 tablets at bedtime by mouth.    . tamoxifen (NOLVADEX) 20 MG tablet Take 1 tablet by mouth once daily   . traMADol (ULTRAM) 50 MG tablet Take 1 tablet (50 mg total) by mouth 3 (three) times daily. 05/16/2020: On hand #33 Last taken today Last filled on  04/02/20  for  90  . trolamine salicylate (ASPERCREME) 10 % cream Apply 1 application topically as needed for muscle pain.   . valACYclovir (VALTREX) 500 MG tablet Take 1 tablet (500 mg total) by mouth 2 (two) times daily. For 3 days when outbreaks. (Patient taking differently: Take 500 mg by mouth 2 (two) times daily as needed. For 3 days when outbreaks.)   . warfarin (COUMADIN) 3 MG tablet TAKE AS DIRECTED BY ANTICOAGULATION CLINIC (Patient taking differently: Take 3 mg by mouth daily. TAKE AS DIRECTED BY  ANTICOAGULATION  CLINIC)    No facility-administered encounter medications on file as of 05/26/2020.    Patient Active Problem List   Diagnosis Date Noted  . Pneumonia due to COVID-19 virus   . Bacteremia due to Gram-positive bacteria 08/29/2018  . Abscess   . Cellulitis of left breast 08/28/2018  . Cellulitis of left abdominal wall 08/28/2018  . Encounter for routine gynecological examination 04/28/2018  . CKD (chronic kidney disease), stage III ([place]) 09/27/2017  . Recurrent genital herpes 04/13/2017  . Chronic pain disorder 01/25/2017  . Malignant neoplasm of upper-outer quadrant of left breast in female, estrogen receptor positive ([place]) 12/16/2016  . Chronic knee pain 12/07/2016  . Chronic bilateral low back pain without sciatica 12/07/2016  . Peripheral musculoskeletal gait disorder 12/07/2016  . Encounter for therapeutic drug monitoring 06/16/2016  . Generalized osteoarthritis of multiple sites 05/04/2016  . Chronic anticoagulation 05/04/2016  . Stroke ([place]) 04/14/2016  . Aortic valve disease 04/10/2016  . S/P AVR 04/10/2016  . NICM (nonischemic cardiomyopathy) ([place]) 04/10/2016   . Upper airway cough syndrome 03/14/2016  . Morbid obesity ([place]) 03/14/2016  . COPD (chronic obstructive pulmonary disease) ([place]) 03/11/2016  . Thrombocytopenia ([place]) 12/12/2015  . Anemia 12/12/2015  . Vitamin D deficiency 12/12/2015  . Acute urinary retention 12/11/2015  . Osteoporosis 12/11/2015  . Hip fracture ([place]) 12/10/2015  . Aortic atherosclerosis ([place]) 12/10/2015  . Lung blebs ([place]) 12/03/2015  . Pelvic fracture ([place]) 08/19/2015  . Fall at home 08/19/2015  . Essential hypertension 08/19/2015  . Mixed hyperlipidemia 08/19/2015  . Asthma 08/19/2015  . Paroxysmal atrial fibrillation ([place]) 08/19/2015  . Hyperthyroidism 08/19/2015  . Chronic HFrEF (heart failure with reduced ejection fraction) ([place])   . Coronary artery disease   . Aortic stenosis     Conditions to be addressed/monitored: Mental Health Concerns regarding reduction and management of symptoms of Depression and Grief, due to recent loss of sister. Limited Social Support, Mental Health Concerns, Social Isolation and Lacks Knowledge of Community Resources for Depression and Grief.    Care Plan : LCSW Plan of Care  Updates made by Francis Gaines, LCSW since 05/26/2020 12:00 AM    Problem: Reduce and Manage Symptoms of Depression, Grief and Loss.   Priority: High    Goal: Reduce and  Manage Symptoms of Depression, Grief and Loss.   Start Date: 05/26/2020  Expected End Date: 07/25/2020  This Visit's Progress: On track  Priority: High  Note:   Current Barriers:  . Unable to independently manage symptoms of Depression and Grief. . Knowledge deficits related to accessing mental health providers in patient with Depression and Grief. . Patient is experiencing symptoms of Depression and Grief, which seem to be exacerbated by the recent death of her sister.     . Patient needs Support, Education, and Care Coordination in order to meet unmet mental health needs. . Patient is unable to independently navigate community  resource options without care coordination support. Clinical Social Work [employer]):  Marland Kitchen Over the next 60 days, patient will work with LCSW weekly, by telephone, to reduce and manage symptoms of Depression and Grief, until connected for ongoing counseling resources within the community.  . Patient will implement clinical interventions discussed today to decrease symptoms of Depression and Grief, and increase knowledge and/or ability of: coping skills, healthy habits, self-management skills, and stress reduction. . Over the next 60 days, patient will demonstrate improved adherence to prescribed treatment plan for Depression and Grief, as evidenced by a reduction in symptoms.  Interventions:  . Collaboration with [name], [name], MD regarding development and update of comprehensive plan of care as evidenced by provider attestation and co-signature. . Assessed patient's understanding, education, previous treatment and care coordination needs.  . Patient interviewed and appropriate assessments performed: Review of PHQ 2 and PHQ 9 Depression Screening scores. . Provided basic mental health support, education and interventions.  Nash Dimmer with appropriate clinical care team members regarding patient needs, requesting Nutrition/Dietary Consult from [name], [name], MD. . Discussed several options for long term counseling based on need and insurance.  . Assisted patient with narrowing down options for long-term counseling services and provided list of community providers in network with [employer].   . Reviewed mental health medications with patient prescribed by PCP and discussed compliance.  . Other interventions include: Motivational Interviewing, Solution-Focused Strategies, Mindfulness Meditation Strategies, Relaxation Techniques, and Deep Breathing Exercises, Brief Cognitive Behavioral Therapy, Grief Counseling, Emotional/Supportive Counseling, Psychotropic Medication Adherence  Assessment, Sleep Hygiene Assessment, Psychoeducation, Health Education, Problem Solving and Teaching/Coaching Strategies.  . Mailed patient mental health counseling resources, list of community providers, coping strategies/techniques and list of support groups. Self-Care Activities: . Self administers medications as prescribed. . Attends all scheduled provider appointments. . Calls pharmacy for medication refills. . Attends church and other social activities. . Lives independently with husband and performs ADL's/IADL's independently. . Calls provider office for new questions and/or concerns. . Motivated for treatment. Jacqulyn Liner family and social support. Patient Goals: . Implement clinical interventions discussed today (I.e. Mindfulness Meditation Strategies, Relaxation Techniques, and Deep Breathing Exercises). . Review - Are You Grieving - Support Through Loss and Transition; 12 Common Symptoms of Depression That Shouldn't Be Ignored; Relaxation Techniques for Depression; Stress Management - Breathing Exercises for Relaxation; Ways to Cope with Caregiver Burnout; and Loneliness and Isolation - Health and safety inspector. . Review - List of Marriage, Relationship, Individual and Family Counseling Services; [place]; List of Therapists and Psychiatrists in [place], [place] and [place]. . Consider self-enrollment in [facility] and/or [facility]. . Select one of the agencies from the list provided and call to schedule an appointment.  . Develop a personal safety plan and develop a plan to deal with  triggers and how to handle bad days, such as holidays, anniversaries, birthdays, etc. . Exercise at least 2 to 3 times per week, or as tolerated, eat a healthy, well-balanced diet and obtain adequate rest. . Journal feelings and what helps to feel better or worse. Marland Kitchen Spend time or talk with others at least  2 to 3 times per week. . Attend weekly telephonic counseling sessions with LCSW for support and reduction of symptoms. Follow Up Plan: Telephone follow up appointment with care management team member scheduled for: 06/04/2020 at 11:15am.      Follow Up Plan: LCSW will follow up with patient by phone on 06/04/2020 at 11:15am.      [name] Streator Licensed Clinical Social Worker [place] [phone]

## 2020-05-26 NOTE — Patient Instructions (Signed)
Visit Information  PATIENT GOALS:  Goals Addressed              This Visit's Progress   .  Reduce and Manage Symptoms of Depression, Grief and Loss. (pt-stated)   On track     Timeframe:  Short-Term Goal Priority:  High Start Date:    05/26/2020                       Expected End Date:   07/25/2020                 Follow Up Date:  06/04/2020 at 11:15am.   Patient Goals: . Implement clinical interventions discussed today (I.e. Mindfulness Meditation Strategies, Relaxation Techniques, and Deep Breathing Exercises). . Review - Are You Grieving - Support Through Loss and Transition; 12 Common Symptoms of Depression That Shouldn't Be Ignored; Relaxation Techniques for Depression; Stress Management - Breathing Exercises for Relaxation; Ways to Cope with Caregiver Burnout; and Loneliness and Isolation - Health and safety inspector. . Review - List of Marriage, Relationship, Individual and Family Counseling Services; [place]; List of Therapists and Psychiatrists in [place], [place] and [place]. . Consider self-enrollment in [facility] and/or [facility] Loss [facility]. . Select one of the agencies from the list provided and call to schedule an appointment.  . Develop a personal safety plan and develop a plan to deal with triggers and how to handle bad days, such as holidays, anniversaries, birthdays, etc. . Exercise at least 2 to 3 times per week, or as tolerated, eat a healthy, well-balanced diet and obtain adequate rest. . Journal feelings and what helps to feel better or worse. Marland Kitchen Spend time or talk with others at least 2 to 3 times per week. . Attend weekly telephonic counseling sessions with LCSW for support and reduction of symptoms.   Why is this important?    Keeping track of your progress will help your treatment team find the right mix of medicine and therapy for you.   Write in your journal every  day.   Day-to-day changes in depression symptoms are normal. It may be more helpful to check your progress at the end of each week instead of every day.          Consent to CCM Services: Danielle [name] was given information about Chronic Care Management services today including:  1. CCM service includes personalized support from designated clinical staff supervised by her physician, including individualized plan of care and coordination with other care providers 2. 24/7 contact phone numbers for assistance for urgent and routine care needs. 3. Service will only be billed when office clinical staff spend 20 minutes or more in a month to coordinate care. 4. Only one practitioner may furnish and bill the service in a calendar month. 5. The patient may stop CCM services at any time (effective at the end of the month) by phone call to the office staff. 6. The patient will be responsible for cost sharing (co-pay) of up to 20% of the service fee (after annual deductible is met).  Patient agreed to services and verbal consent obtained.   Patient verbalizes understanding of instructions provided today and agrees to view in [place].   Telephone follow up appointment with care management team member scheduled for: 06/04/2020 at 11:15am.  [name] LCSW Licensed Clinical Social Worker [facility] [phone]  CLINICAL CARE PLAN: Patient Care Plan: Heart Failure (  Adult)    Problem Identified: Symptom Exacerbation (Heart Failure)   Priority: Medium    Goal: Symptom Exacerbation Prevented or Minimized   Start Date: 03/13/2020  Expected End Date: 06/27/2020  This Visit's Progress: On track  Recent Progress: On track  Priority: Medium  Note:   Evidence-based guidance:  Perform or review cognitive and/or health literacy screening.  Assess understanding of adherence and barriers to treatment plan, as well as lifestyle changes; develop strategies to address barriers.   Establish a mutually-agreed-upon early intervention process to communicate with primary care provider when signs/symptoms worsen.  Facilitate timely posthospital discharge  with follow-up via telephone calls, home visit, telehealth monitoring. Facilitate timely visit, usually within 1 week, with primary care provider following hospital discharge.  Review immunization status. Notes:    Task: Identify and Minimize Risk of Heart Failure Exacerbation   Due Date: 06/27/2020  Priority: Routine  Note:   Care Management Activities:    - barriers to lifestyle changes reviewed and addressed - barriers to treatment reviewed and addressed - cognitive screening completed and reviewed - depression screen reviewed - health literacy screening completed or reviewed - healthy lifestyle promoted - in-home support services arranged - medication-adherence assessment completed - rescue (action) plan developed - rescue (action) plan reviewed - self-awareness of signs/symptoms of worsening disease encouraged    Notes:  3/10-Pt reports she has been wearing her compression stockings at night during sleep. Strongly encourage pt to only wear her compression stockings during the day when she is mobile not during the night when sleeping. Verified pt remains in the GREEN zone with an acute symptoms or swelling related to her CHF. Pt requested dietary information to assist with her unhealthy snacking and eating habits. Will send emmi and requested a dietitian referral from her provider's office.  Feb- pt adherent to all her medications and continue to follow the plan of care related to HF. Denies any symptoms with no retention of fluid (GREEN zone). Due to a recent heart catheretization will extend to allow ongoing adherence.   Problem Identified: Disease Progression (Heart Failure)   Priority: Medium    Long-Range Goal: Comorbidities Identified and Managed   Start Date: 03/13/2020  Expected End Date: 06/27/2020   This Visit's Progress: On track  Recent Progress: On track  Priority: Medium  Note:   Evidence-based guidance:  Assess and address signs/symptoms of comorbidity. Notes:  Educate on how daily monitoring is necessary in managing her other medical conditions.   Task: Identify and Manage Comorbidities   Due Date: 06/27/2020  Priority: Routine  Note:   Care Management Activities:    - medication side effects monitored and managed - signs/symptoms of comorbidities identified    Notes: Feb- Confirmed pt adherent with all her medications with no signs/symptoms presented as barriers at this time.   Patient Care Plan: LCSW Plan of Care    Problem Identified: Reduce and Manage Symptoms of Depression, Grief and Loss.   Priority: High    Goal: Reduce and Manage Symptoms of Depression, Grief and Loss.   Start Date: 05/26/2020  Expected End Date: 07/25/2020  This Visit's Progress: On track  Priority: High  Note:   Current Barriers:  . Unable to independently manage symptoms of Depression and Grief. . Knowledge deficits related to accessing mental health providers in patient with Depression and Grief. . Patient is experiencing symptoms of Depression and Grief, which seem to be exacerbated by the recent death of her sister.     . Patient needs  Support, Education, and Care Coordination in order to meet unmet mental health needs. . Patient is unable to independently navigate community resource options without care coordination support. Clinical Social Work [employer]):  Marland Kitchen Over the next 60 days, patient will work with LCSW weekly, by telephone, to reduce and manage symptoms of Depression and Grief, until connected for ongoing counseling resources within the community.  . Patient will implement clinical interventions discussed today to decrease symptoms of Depression and Grief, and increase knowledge and/or ability of: coping skills, healthy habits, self-management skills, and stress reduction. . Over  the next 60 days, patient will demonstrate improved adherence to prescribed treatment plan for Depression and Grief, as evidenced by a reduction in symptoms.  Interventions:  . Collaboration with [name], [name], MD regarding development and update of comprehensive plan of care as evidenced by provider attestation and co-signature. . Assessed patient's understanding, education, previous treatment and care coordination needs.  . Patient interviewed and appropriate assessments performed: Review of PHQ 2 and PHQ 9 Depression Screening scores. . Provided basic mental health support, education and interventions.  Nash Dimmer with appropriate clinical care team members regarding patient needs, requesting Nutrition/Dietary Consult from [name], [name], MD. . Discussed several options for long term counseling based on need and insurance.  . Assisted patient with narrowing down options for long-term counseling services and provided list of community providers in network with [employer].   . Reviewed mental health medications with patient prescribed by PCP and discussed compliance.  . Other interventions include: Motivational Interviewing, Solution-Focused Strategies, Mindfulness Meditation Strategies, Relaxation Techniques, and Deep Breathing Exercises, Brief Cognitive Behavioral Therapy, Grief Counseling, Emotional/Supportive Counseling, Psychotropic Medication Adherence Assessment, Sleep Hygiene Assessment, Psychoeducation, Health Education, Problem Solving and Teaching/Coaching Strategies.  . Mailed patient mental health counseling resources, list of community providers, coping strategies/techniques and list of support groups. Self-Care Activities: . Self administers medications as prescribed. . Attends all scheduled provider appointments. . Calls pharmacy for medication refills. . Attends church and other social activities. . Lives independently with husband and performs ADL's/IADL's  independently. . Calls provider office for new questions and/or concerns. . Motivated for treatment. Jacqulyn Liner family and social support. Patient Goals: . Implement clinical interventions discussed today (I.e. Mindfulness Meditation Strategies, Relaxation Techniques, and Deep Breathing Exercises). . Review - Are You Grieving - Support Through Loss and Transition; 12 Common Symptoms of Depression That Shouldn't Be Ignored; Relaxation Techniques for Depression; Stress Management - Breathing Exercises for Relaxation; Ways to Cope with Caregiver Burnout; and Loneliness and Isolation - Health and safety inspector. . Review - List of Marriage, Relationship, Individual and Family Counseling Services; [place]; List of Therapists and Psychiatrists in [place], [place] and [place]. . Consider self-enrollment in Grief Share Support Group and/or [facility]. . Select one of the agencies from the list provided and call to schedule an appointment.  . Develop a personal safety plan and develop a plan to deal with triggers and how to handle bad days, such as holidays, anniversaries, birthdays, etc. . Exercise at least 2 to 3 times per week, or as tolerated, eat a healthy, well-balanced diet and obtain adequate rest. . Journal feelings and what helps to feel better or worse. Marland Kitchen Spend time or talk with others at least 2 to 3 times per week. . Attend weekly telephonic counseling sessions with LCSW for support and reduction of symptoms. Follow Up Plan: Telephone follow up appointment with care management team  member scheduled for: 06/04/2020 at 11:15am.

## 2020-05-27 NOTE — Telephone Encounter (Signed)
Called and left a VM for patient. This is the 3rd attempt. Will print result note and mail to patient. Closing encounter.

## 2020-05-29 ENCOUNTER — Inpatient Hospital Stay: Payer: Medicare Other | Admitting: General Practice

## 2020-05-29 ENCOUNTER — Encounter: Payer: Self-pay | Admitting: General Practice

## 2020-05-29 NOTE — Progress Notes (Signed)
[facility] CSW Progress Notes  Called patient to follow up on concerns re depression and grief.  No answer, left VM w my contact information and encouragement to call back. Also noted that she is engaged w CSW Product manager at her PCP office and appears to have a good treatment plan and support in place.  Will await patient call back.   Edwyna Shell, LCSW Clinical Social Worker Phone:  [phone]

## 2020-06-02 ENCOUNTER — Other Ambulatory Visit: Payer: Self-pay

## 2020-06-02 ENCOUNTER — Ambulatory Visit (INDEPENDENT_AMBULATORY_CARE_PROVIDER_SITE_OTHER): Payer: Medicare Other | Admitting: General Practice

## 2020-06-02 DIAGNOSIS — Z7901 Long term (current) use of anticoagulants: Secondary | ICD-10-CM | POA: Diagnosis not present

## 2020-06-02 LAB — POCT INR: INR: 2.4 (ref 2.0–3.0)

## 2020-06-02 NOTE — Patient Instructions (Addendum)
Pre visit review using our clinic review tool, if applicable. No additional management support is needed unless otherwise documented below in the visit note.  Continue to take 1 (3 mg) tablet daily.  Re-check in 4 weeks.

## 2020-06-04 ENCOUNTER — Ambulatory Visit (INDEPENDENT_AMBULATORY_CARE_PROVIDER_SITE_OTHER): Payer: Medicare Other | Admitting: *Deleted

## 2020-06-04 DIAGNOSIS — N183 Chronic kidney disease, stage 3 unspecified: Secondary | ICD-10-CM

## 2020-06-04 DIAGNOSIS — J449 Chronic obstructive pulmonary disease, unspecified: Secondary | ICD-10-CM

## 2020-06-04 DIAGNOSIS — F4329 Adjustment disorder with other symptoms: Secondary | ICD-10-CM

## 2020-06-04 DIAGNOSIS — F321 Major depressive disorder, single episode, moderate: Secondary | ICD-10-CM

## 2020-06-04 DIAGNOSIS — F4381 Prolonged grief disorder: Secondary | ICD-10-CM

## 2020-06-04 DIAGNOSIS — G894 Chronic pain syndrome: Secondary | ICD-10-CM

## 2020-06-04 NOTE — Patient Instructions (Signed)
Visit Information  PATIENT GOALS: Goals Addressed              This Visit's Progress   .  Reduce and Manage Symptoms of Depression, Grief and Loss. (pt-stated)   On track     Timeframe:  Short-Term Goal Priority:  High Start Date:    05/26/2020                       Expected End Date:   07/25/2020                 Follow Up Date:  06/18/2020 at 11:30am.   Patient Goals: . Continue to implement clinical interventions to help reduce and manage symptoms (I.e. Mindfulness Meditation Strategies, Relaxation Techniques, and Deep Breathing Exercises). . Reviewed "Are You Grieving - Support Through Loss and Transition", and "12 Common Symptoms of Depression That Shouldn't Be Ignored;", with patient to ensure understanding and to allow for questions. . Review "Relaxation Techniques for Depression" and "Stress Management - Breathing Exercises for Relaxation" and be prepared to discuss with LCSW during the next scheduled telephone outreach call. . Independent Review of "Ways to Cope with Caregiver Burnout" and "Loneliness and Isolation - Health and safety inspector". . Begin contacting providers from the list of agencies mailed to your home, which included all of the following:  List of Marriage, Relationship, Individual and Family Counseling Services, [place], List of Therapists and Psychiatrists in [place], [place] and [place]. . Initiate self-enrollment in the Grief and Loss Support Group offered through [employer].  . Discussion of personal safety plan and appropriate interventions for dealing with triggers and how to handle bad days.   . Exercise at least 2 to 3 times per week, or as tolerated, eat a healthy, well-balanced diet, drink plenty of fluids and obtain adequate rest. . Spend time talking or visiting with friends, family members, neighbors, congregation members, etc. to gain support and socialization.   . Attend weekly telephonic  counseling sessions with LCSW for support and reduction of symptoms.        Patient verbalizes understanding of instructions provided today and agrees to view in [place].   Telephone follow up appointment with care management team member scheduled for:  06/18/2020 at 11:30am.  [name] LCSW Licensed Clinical Social Worker [facility] [phone]

## 2020-06-04 NOTE — Chronic Care Management (AMB) (Signed)
Chronic Care Management    Clinical Social Work Note  06/04/2020 Name: Danielle [name] MRN: [id number] DOB: 1945-10-31  Danielle [name] is a 75 y.o. year old female who is a primary care patient of [name], [name], MD. The CCM team was consulted to assist the patient with chronic disease management and/or care coordination needs related to: [place] and Resources for Chronic Obstructive Pulmonary Disease, Stage III Chronic Kidney Disease, Major Depression, Chronic Pain Disorder, Grief Reaction with Prolonged Bereavement, Grief Counseling and Hospice/Palliative Care Services Education.   Engaged with patient by telephone for follow up visit in response to provider referral for social work chronic care management and care coordination services.   Consent to Services:  The patient was given information about Chronic Care Management services, agreed to services, and gave verbal consent prior to initiation of services.  Please see initial visit note for detailed documentation.   Patient agreed to services and consent obtained.   Assessment: Review of patient past medical history, allergies, medications, and health status, including review of relevant consultants reports was performed today as part of a comprehensive evaluation and provision of chronic care management and care coordination services.     SDOH (Social Determinants of Health) assessments and interventions performed:    Advanced Directives Status: Not addressed in this encounter.  CCM Care Plan  Allergies  Allergen Reactions  . Lisinopril Cough  . Tetanus Toxoid Adsorbed Swelling    Arm swelling  . Other Cough    PEANUTS    Outpatient Encounter Medications as of 06/04/2020  Medication Sig Note  . albuterol (PROVENTIL HFA;VENTOLIN HFA) 108 (90 Base) MCG/ACT inhaler Inhale 2 puffs into the lungs every 6 (six) hours as needed for wheezing or shortness of breath.    Marland Kitchen alendronate (FOSAMAX)  70 MG tablet Take 1 tablet (70 mg total) by mouth once a week. Take with a full glass of water on an empty stomach. 03/04/2020: Saturday  . ascorbic acid (VITAMIN C) 500 MG tablet Take 500 mg by mouth daily.   Marland Kitchen aspirin EC 81 MG tablet Take 81 mg daily by mouth.   Marland Kitchen atorvastatin (LIPITOR) 40 MG tablet Take 1 tablet by mouth daily.   . carvedilol (COREG) 25 MG tablet Take 25 mg by mouth 2 (two) times daily with a meal.   . Cholecalciferol 25 MCG (1000 UT) tablet Take 1,000 Units by mouth daily.   . diclofenac sodium (VOLTAREN) 1 % GEL Apply 4 g topically 4 (four) times daily.   . diphenhydramine-acetaminophen (TYLENOL PM) 25-500 MG TABS tablet Take 2 tablets by mouth at bedtime.   . fluticasone (FLONASE) 50 MCG/ACT nasal spray Place 1 spray into both nostrils daily.   . furosemide (LASIX) 20 MG tablet Take 2 tablets (40 mg total) by mouth daily.   Marland Kitchen MELATONIN PO Take 5 mg by mouth at bedtime.   . methimazole (TAPAZOLE) 5 MG tablet Take 3 tablets (15 mg total) by mouth daily.   . mometasone-formoterol (DULERA) 200-5 MCG/ACT AERO Inhale 2 puffs into the lungs 2 (two) times daily.   . pantoprazole (PROTONIX) 40 MG tablet Take 1 tablet (40 mg total) by mouth daily.   . rosuvastatin (CRESTOR) 40 MG tablet Take 1 tablet (40 mg total) by mouth daily.   . sacubitril-valsartan (ENTRESTO) 49-51 MG Take 1 tablet by mouth 2 (two) times daily.   Marland Kitchen senna-docusate (SENOKOT-S) 8.6-50 MG tablet Take 2 tablets at bedtime by mouth.    . tamoxifen (NOLVADEX) 20 MG tablet  Take 1 tablet by mouth once daily   . traMADol (ULTRAM) 50 MG tablet Take 1 tablet (50 mg total) by mouth 3 (three) times daily. 05/16/2020: On hand #33 Last taken today Last filled on  04/02/20  for  90  . trolamine salicylate (ASPERCREME) 10 % cream Apply 1 application topically as needed for muscle pain.   . valACYclovir (VALTREX) 500 MG tablet Take 1 tablet (500 mg total) by mouth 2 (two) times daily. For 3 days when outbreaks. (Patient taking  differently: Take 500 mg by mouth 2 (two) times daily as needed. For 3 days when outbreaks.)   . warfarin (COUMADIN) 3 MG tablet TAKE AS DIRECTED BY ANTICOAGULATION CLINIC (Patient taking differently: Take 3 mg by mouth daily. TAKE AS DIRECTED BY  ANTICOAGULATION  CLINIC)    No facility-administered encounter medications on file as of 06/04/2020.    Patient Active Problem List   Diagnosis Date Noted  . Pneumonia due to COVID-19 virus   . Bacteremia due to Gram-positive bacteria 08/29/2018  . Abscess   . Cellulitis of left breast 08/28/2018  . Cellulitis of left abdominal wall 08/28/2018  . Encounter for routine gynecological examination 04/28/2018  . CKD (chronic kidney disease), stage III ([place]) 09/27/2017  . Recurrent genital herpes 04/13/2017  . Chronic pain disorder 01/25/2017  . Malignant neoplasm of upper-outer quadrant of left breast in female, estrogen receptor positive ([place]) 12/16/2016  . Chronic knee pain 12/07/2016  . Chronic bilateral low back pain without sciatica 12/07/2016  . Peripheral musculoskeletal gait disorder 12/07/2016  . Encounter for therapeutic drug monitoring 06/16/2016  . Generalized osteoarthritis of multiple sites 05/04/2016  . Chronic anticoagulation 05/04/2016  . Stroke ([place]) 04/14/2016  . Aortic valve disease 04/10/2016  . S/P AVR 04/10/2016  . NICM (nonischemic cardiomyopathy) ([place]) 04/10/2016  . Upper airway cough syndrome 03/14/2016  . Morbid obesity ([place]) 03/14/2016  . COPD (chronic obstructive pulmonary disease) ([place]) 03/11/2016  . Thrombocytopenia ([place]) 12/12/2015  . Anemia 12/12/2015  . Vitamin D deficiency 12/12/2015  . Acute urinary retention 12/11/2015  . Osteoporosis 12/11/2015  . Hip fracture ([place]) 12/10/2015  . Aortic atherosclerosis ([place]) 12/10/2015  . Lung blebs ([place]) 12/03/2015  . Pelvic fracture ([place]) 08/19/2015  . Fall at home 08/19/2015  . Essential hypertension 08/19/2015  . Mixed hyperlipidemia 08/19/2015  . Asthma  08/19/2015  . Paroxysmal atrial fibrillation ([place]) 08/19/2015  . Hyperthyroidism 08/19/2015  . Chronic HFrEF (heart failure with reduced ejection fraction) ([place])   . Coronary artery disease   . Aortic stenosis     Conditions to be addressed/monitored:  Mental Health Counseling and Resources for Chronic Obstructive Pulmonary Disease, Stage III Chronic Kidney Disease, Major Depression, Chronic Pain Disorder, Grief Reaction with Prolonged Bereavement, Grief Counseling and Hospice/Palliative Care Services Education.  Limited Social Support, Mental Health Concerns, Grief and Loss and Social Isolation.  Care Plan : LCSW Plan of Care  Updates made by Francis Gaines, LCSW since 06/04/2020 12:00 AM    Problem: Reduce and Manage Symptoms of Depression, Grief and Loss.   Priority: High    Goal: Reduce and Manage Symptoms of Depression, Grief and Loss.   Start Date: 05/26/2020  Expected End Date: 07/25/2020  This Visit's Progress: On track  Recent Progress: On track  Priority: High  Note:   Current Barriers:  . Unable to independently manage symptoms of Depression and Grief. . Knowledge deficits related to accessing mental health providers in patient with Depression and Grief. . Patient is experiencing symptoms of  Depression and Grief, which seem to be exacerbated by the recent death of her sister.     . Patient needs Support, Education, and Care Coordination in order to meet unmet mental health needs. . Patient is unable to independently navigate community resource options without care coordination support. Clinical Social Work [employer]):  Marland Kitchen Over the next 60 days, patient will work with LCSW weekly, by telephone, to reduce and manage symptoms of Depression and Grief, until connected for ongoing counseling resources within the community.  . Patient will implement clinical interventions discussed today to decrease symptoms of Depression and Grief, and increase knowledge and/or ability of: coping  skills, healthy habits, self-management skills, and stress reduction. . Over the next 60 days, patient will demonstrate improved adherence to prescribed treatment plan for Depression and Grief, as evidenced by a reduction in symptoms.  Interventions:  . Collaboration with [name], [name], MD regarding development and update of comprehensive plan of care as evidenced by provider attestation and co-signature. . Assessed patient's understanding, education, previous treatment and care coordination needs.  . Patient interviewed and appropriate assessments performed: Review of PHQ 2 and PHQ 9 Depression Screening scores. . Provided basic mental health support, education and interventions.  Nash Dimmer with appropriate clinical care team members regarding patient needs, requesting Nutrition/Dietary Consult from [name], [name], MD. . Discussed several options for long term counseling based on need and insurance.  . Assisted patient with narrowing down options for long-term counseling services and provided list of community providers in network with [employer].   . Reviewed mental health medications with patient prescribed by PCP and discussed compliance.  . Other interventions include: Motivational Interviewing, Solution-Focused Strategies, Mindfulness Meditation Strategies, Relaxation Techniques, and Deep Breathing Exercises, Brief Cognitive Behavioral Therapy, Grief Counseling, Emotional/Supportive Counseling, Psychotropic Medication Adherence Assessment, Sleep Hygiene Assessment, Psychoeducation, Health Education, Problem Solving and Teaching/Coaching Strategies.  . Mailed patient mental health counseling resources, list of community providers, coping strategies/techniques and list of support groups. Self-Care Activities: . Self administers medications as prescribed; Attends all scheduled provider appointments; Calls pharmacy for medication refills; Attends church and other social  activities; Lives independently with husband and performs ADL's/IADL's independently; Calls provider office for new questions and/or concerns; Motivated for treatment; Strong family and social support. Patient Goals: . Continue to implement clinical interventions to help reduce and manage symptoms (I.e. Mindfulness Meditation Strategies, Relaxation Techniques, and Deep Breathing Exercises). . Reviewed "Are You Grieving - Support Through Loss and Transition", and "12 Common Symptoms of Depression That Shouldn't Be Ignored;", with patient to ensure understanding and to allow for questions. . Review "Relaxation Techniques for Depression" and "Stress Management - Breathing Exercises for Relaxation" and be prepared to discuss with LCSW during the next scheduled telephone outreach call. . Independent Review of "Ways to Cope with Caregiver Burnout" and "Loneliness and Isolation - Health and safety inspector". . Begin contacting providers from the list of agencies mailed to your home, which included all of the following:  List of Marriage, Relationship, Individual and Family Counseling Services, [place], List of Therapists and Psychiatrists in [place], [place] and [place]. . Initiate self-enrollment in the Grief and Loss Support Group offered through [employer].  . Discussion of personal safety plan and appropriate interventions for dealing with triggers and how to handle bad days.   . Exercise at least 2 to 3 times per week, or as tolerated, eat a healthy, well-balanced diet, drink plenty of fluids and obtain adequate rest. .  Spend time talking or visiting with friends, family members, neighbors, congregation members, etc. to gain support and socialization.   . Attend weekly telephonic counseling sessions with LCSW for support and reduction of symptoms. Follow Up Plan: Telephone follow up appointment with care management team member scheduled for:  06/18/2020 at 11:30am.      Follow Up Plan: Telephone follow up appointment with care management team member scheduled for: 06/18/2020 at 11:30am      [place] Licensed Clinical Social Worker [place] [phone]

## 2020-06-06 ENCOUNTER — Telehealth: Payer: Self-pay | Admitting: *Deleted

## 2020-06-06 NOTE — Chronic Care Management (AMB) (Signed)
  Care Management   Note  06/06/2020 Name: Danielle [name] MRN: [id number] DOB: Sep 30, 1945  Danielle [name] is a 75 y.o. year old female who is a primary care patient of [name], [name], MD and is actively engaged with the care management team. I reached out to [name] by phone today to assist with scheduling an initial visit with the RN Case Manager  Follow up plan: Telephone appointment with care management team member scheduled for: 06/24/2020  [place] Management

## 2020-06-09 ENCOUNTER — Other Ambulatory Visit: Payer: Self-pay | Admitting: Family Medicine

## 2020-06-09 ENCOUNTER — Ambulatory Visit: Payer: Self-pay | Admitting: *Deleted

## 2020-06-10 ENCOUNTER — Telehealth: Payer: Self-pay | Admitting: Pharmacist

## 2020-06-10 NOTE — Chronic Care Management (AMB) (Signed)
I left the patient a message about her upcoming appointment on 06/11/2020 @ 1:00 pm with the clinical pharmacist. She was asked to please have all medication on hand to review with the pharmacist.   Neita Goodnight) Mare Ferrari, [place] [phone]

## 2020-06-11 ENCOUNTER — Ambulatory Visit: Payer: Medicare Other | Admitting: Pharmacist

## 2020-06-11 DIAGNOSIS — I480 Paroxysmal atrial fibrillation: Secondary | ICD-10-CM | POA: Diagnosis not present

## 2020-06-11 DIAGNOSIS — I10 Essential (primary) hypertension: Secondary | ICD-10-CM

## 2020-06-11 DIAGNOSIS — I5022 Chronic systolic (congestive) heart failure: Secondary | ICD-10-CM | POA: Diagnosis not present

## 2020-06-11 DIAGNOSIS — E0590 Thyrotoxicosis, unspecified without thyrotoxic crisis or storm: Secondary | ICD-10-CM

## 2020-06-11 DIAGNOSIS — J449 Chronic obstructive pulmonary disease, unspecified: Secondary | ICD-10-CM | POA: Diagnosis not present

## 2020-06-11 DIAGNOSIS — F321 Major depressive disorder, single episode, moderate: Secondary | ICD-10-CM | POA: Diagnosis not present

## 2020-06-11 DIAGNOSIS — N183 Chronic kidney disease, stage 3 unspecified: Secondary | ICD-10-CM | POA: Diagnosis not present

## 2020-06-11 NOTE — Progress Notes (Signed)
Chronic Care Management Pharmacy Note  06/26/2020 Name:  Danielle [name] MRN:  [id number] DOB:  04-05-1945  Subjective: Danielle [name] is an 75 y.o. year old female who is a primary patient of [name], [name], MD.  The CCM team was consulted for assistance with disease management and care coordination needs.    Engaged with patient by telephone for follow up visit in response to provider referral for pharmacy case management and/or care coordination services.   Consent to Services:  The patient was given information about Chronic Care Management services, agreed to services, and gave verbal consent prior to initiation of services.  Please see initial visit note for detailed documentation.   Patient Care Team: [name], Danielle G, MD as PCP - General (Family Medicine) End, Harrell Gave, MD as PCP - Cardiology (Cardiology) [name], MD as Consulting Physician (Hematology) Kyung Rudd, MD as Consulting Physician (Radiation Oncology) Rolm Bookbinder, MD as Consulting Physician (General Surgery) Delice Bison, Charlestine Massed, NP as Nurse Practitioner (Hematology and Oncology) Saporito, Maree Erie, LCSW as Social Worker (Licensed Clinical Social Worker) Dimitri Ped, RN as Case Manager Viona Gilmore, [facility] as Pharmacist (Pharmacist)  Recent office visits: 05/05/20 Danielle [name], RD: Patient presented for anti-coag visit. INR 4.3, goal 2-3. Recommended holding dose x 2. Recheck in 4 weeks.  01/30/20 Danielle [name], MD: Patient presented for chronic conditions follow up. No changes made.  Recent consult visits: 05/23/20 Danielle Bush, MD (cardiology): Patient presented for heart failure follow up. She cut her furosemide down to 20 mg daily because she felt like 40 mg daily was drying her out too much.  05/21/20 Danielle Clayman, NP (oncology): Patient presented for breast cancer follow up. Plan for repeat mammogram and DEXA in Nov 2022. Plan for 5-10 years on anastrazole  (started 2019).  05/16/20 Danielle Penna, MD (phys med & rehab): Patient presented for follow up. Performed radio frequency procedure.  03/19/20 Danielle Bush, MD (cardiology): Patient presented for heart failure follow up. No medication changes made.  02/28/20 Patient presented for PT session.  Hospital visits: 03/10/20 Patient admitted for heart cath.  10/20-10/23/21 Patient admitted for COVID infection.   Objective:  Lab Results  Component Value Date   CREATININE 1.31 (H) 05/21/2020   BUN 13 05/21/2020   GFR 57.35 (L) 01/04/2018   GFRNONAA 43 (L) 05/21/2020   GFRAA 57 (L) 02/27/2020   NA 143 05/21/2020   K 4.9 05/21/2020   CALCIUM 9.9 05/21/2020   CO2 24 05/21/2020   GLUCOSE 97 05/21/2020    Lab Results  Component Value Date/Time   HGBA1C 5.0 08/28/2018 05:25 PM   HGBA1C 5.1 04/13/2017 04:58 PM   GFR 57.35 (L) 01/04/2018 12:32 PM   GFR 58.52 (L) 09/28/2017 08:06 AM   MICROALBUR 1.9 01/04/2018 12:32 PM   MICROALBUR <0.7 10/05/2017 12:49 PM    Last diabetic Eye exam: No results found for: HMDIABEYEEXA  Last diabetic Foot exam: No results found for: HMDIABFOOTEX   Lab Results  Component Value Date   CHOL 161 01/29/2019   HDL 64.90 01/29/2019   LDLCALC 81 01/29/2019   TRIG 77.0 01/29/2019   CHOLHDL 2 01/29/2019    Hepatic Function Latest Ref Rng & Units 05/21/2020 12/31/2019 12/22/2019  Total Protein 6.5 - 8.1 g/dL 7.5 6.4 5.5(L)  Albumin 3.5 - 5.0 g/dL 3.7 - 2.7(L)  AST 15 - 41 U/L 13(L) 18 25  ALT 0 - 44 U/L 11 22 35  Alk Phosphatase 38 - 126 U/L 52 - 34(L)  Total Bilirubin  0.3 - 1.2 mg/dL 0.5 0.5 0.7    Lab Results  Component Value Date/Time   TSH 2.14 01/30/2020 10:54 AM   TSH 0.55 01/29/2019 12:32 PM   FREET4 0.91 01/29/2019 12:32 PM   FREET4 0.80 06/14/2017 03:24 PM    CBC Latest Ref Rng & Units 05/21/2020 03/10/2020 03/10/2020  WBC 4.0 - 10.5 K/uL 5.3 - -  Hemoglobin 12.0 - 15.0 g/dL 12.4 11.6(L) 11.9(L)  Hematocrit 36.0 - 46.0 % 38.2 34.0(L)  35.0(L)  Platelets 150 - 400 K/uL 95(L) - -    Lab Results  Component Value Date/Time   VD25OH 39.12 01/29/2019 12:32 PM   VD25OH 36.44 01/04/2018 12:32 PM    Clinical ASCVD: Yes  The ASCVD Risk score Danielle [name]) failed to calculate for the following reasons:   The patient has a prior MI or stroke diagnosis    Depression screen [facility] 2/9 06/24/2020 06/17/2020 05/26/2020  Decreased Interest 0 0 0  Down, Depressed, Hopeless 0 0 1  PHQ - 2 Score 0 0 1  Some recent data might be hidden     CHA2DS2/VAS Stroke Risk Points  Current as of 9 minutes ago     7 >= 2 Points: High Risk  1 - 1.99 Points: Medium Risk  0 Points: Low Risk    Last Change: N/A      Details    This score determines the patient's risk of having a stroke if the  patient has atrial fibrillation.       Points Metrics  1 Has Congestive Heart Failure:  Yes    Current as of 9 minutes ago  1 Has Vascular Disease:  Yes    Current as of 9 minutes ago  1 Has Hypertension:  Yes    Current as of 9 minutes ago  1 Age:  45    Current as of 9 minutes ago  0 Has Diabetes:  No    Current as of 9 minutes ago  2 Had Stroke:  Yes  Had TIA:  No  Had Thromboembolism:  No    Current as of 9 minutes ago  1 Female:  Yes    Current as of 9 minutes ago      Social History   Tobacco Use  Smoking Status Former Smoker  . Packs/day: 0.25  . Years: 10.00  . Pack years: 2.50  . Types: Cigarettes  . Quit date: 03/01/2010  . Years since quitting: 10.3  Smokeless Tobacco Never Used   BP Readings from Last 3 Encounters:  06/17/20 116/83  05/23/20 100/70  05/21/20 110/77   Pulse Readings from Last 3 Encounters:  06/17/20 81  05/23/20 82  05/21/20 85   Wt Readings from Last 3 Encounters:  06/17/20 248 lb (112.5 kg)  05/23/20 248 lb 6 oz (112.7 kg)  05/21/20 251 lb (113.9 kg)   BMI Readings from Last 3 Encounters:  06/17/20 38.84 kg/m  05/23/20 38.90 kg/m  05/21/20 39.31 kg/m     Assessment/Interventions: Review of patient past medical history, allergies, medications, health status, including review of consultants reports, laboratory and other test data, was performed as part of comprehensive evaluation and provision of chronic care management services.   SDOH:  (Social Determinants of Health) assessments and interventions performed: No  SDOH Screenings   Alcohol Screen: Low Risk   . Last Alcohol Screening Score (AUDIT): 0  Depression (PHQ2-9): Low Risk   . PHQ-2 Score: 0  Financial Resource Strain: Medium Risk  .  Difficulty of Paying Living Expenses: Somewhat hard  Food Insecurity: No Food Insecurity  . Worried About Charity fundraiser in the Last Year: Never true  . Ran Out of Food in the Last Year: Never true  Housing: Low Risk   . Last Housing Risk Score: 0  Physical Activity: Sufficiently Active  . Days of Exercise per Week: 7 days  . Minutes of Exercise per Session: 30 min  Social Connections: Moderately Integrated  . Frequency of Communication with Friends and Family: More than three times a week  . Frequency of Social Gatherings with Friends and Family: More than three times a week  . Attends Religious Services: More than 4 times per year  . Active Member of Clubs or Organizations: No  . Attends Archivist Meetings: Never  . Marital Status: Married  Stress: No Stress Concern Present  . Feeling of Stress : Only a little  Tobacco Use: Medium Risk  . Smoking Tobacco Use: Former Smoker  . Smokeless Tobacco Use: Never Used  Transportation Needs: No Transportation Needs  . Lack of Transportation (Medical): No  . Lack of Transportation (Non-Medical): No    CCM Care Plan  Allergies  Allergen Reactions  . Lisinopril Cough  . Tetanus Toxoid Adsorbed Swelling    Arm swelling  . Other Cough    PEANUTS    Medications Reviewed Today    Reviewed by Dimitri Ped, RN (Registered Nurse) on 06/24/20 at 1132  Med List Status: <None>   Medication Order Taking? Sig Documenting Provider Last Dose Status Informant  albuterol (PROVENTIL HFA;VENTOLIN HFA) 108 (90 Base) MCG/ACT inhaler [id number] No Inhale 2 puffs into the lungs every 6 (six) hours as needed for wheezing or shortness of breath.  [provider] Taking Active Self  alendronate (FOSAMAX) 70 MG tablet [id number] No Take 1 tablet (70 mg total) by mouth once a week. Take with a full glass of water on an empty stomach. [name], Danielle G, MD Taking Active Self           Med Note Wilmon Pali, MELISSA R   Tue Mar 04, 2020  4:12 PM) Saturday  ascorbic acid (VITAMIN C) 500 MG tablet [id number] No Take 500 mg by mouth daily. [provider] Taking Active   aspirin EC 81 MG tablet [id number] No Take 81 mg daily by mouth. [provider] Taking Active Self  atorvastatin (LIPITOR) 40 MG tablet [id number] No Take 1 tablet by mouth daily. [provider] Taking Active   carvedilol (COREG) 25 MG tablet [id number] No Take 25 mg by mouth 2 (two) times daily with a meal. [provider] Taking Active   Cholecalciferol 25 MCG (1000 UT) tablet [id number] No Take 1,000 Units by mouth daily. [provider] Taking Active Self  diclofenac sodium (VOLTAREN) 1 % GEL [id number] No Apply 4 g topically 4 (four) times daily. Kirsteins, Luanna Salk, MD Taking Active   diphenhydramine-acetaminophen (TYLENOL PM) 25-500 MG TABS tablet [id number] No Take 2 tablets by mouth at bedtime. [provider] Taking Active Self  fluticasone (FLONASE) 50 MCG/ACT nasal spray [id number] No Place 1 spray into both nostrils daily. [name], Danielle G, MD Taking Active Self  furosemide (LASIX) 20 MG tablet [id number] No Take 2 tablets (40 mg total) by mouth daily. Danielle Bush, MD Taking Active   MELATONIN PO [id number] No Take 5 mg by mouth at bedtime. [provider] Taking Active Self           Med Note (  Janan Ridge   Wed Feb 27, 2020  8:12 AM)     methimazole (TAPAZOLE) 5 MG tablet [id number] No Take 3 tablets (15 mg total) by mouth daily. [name], Danielle G, MD Taking Active   mometasone-formoterol [facility]) 200-5 MCG/ACT Hollie Salk [id number] No Inhale 2 puffs into the lungs 2 (two) times daily. [provider] Taking Active Self  pantoprazole (PROTONIX) 40 MG tablet [id number] No Take 1 tablet (40 mg total) by mouth daily. Elgergawy, Silver Huguenin, MD Taking Active Self  rosuvastatin (CRESTOR) 40 MG tablet [id number] No TAKE 1 TABLET BY MOUTH  DAILY [name], Danielle G, MD Taking Active   sacubitril-valsartan (ENTRESTO) 49-51 MG [id number] No Take 1 tablet by mouth 2 (two) times daily. [provider] Taking Active   senna-docusate (SENOKOT-S) 8.6-50 MG tablet [id number] No Take 2 tablets at bedtime by mouth.  [provider] Taking Active Self  tamoxifen (NOLVADEX) 20 MG tablet [id number] No Take 1 tablet by mouth once daily Alla Feeling, NP Taking Active   traMADol (ULTRAM) 50 MG tablet [id number]  Take 1 tablet (50 mg total) by mouth 3 (three) times daily. Kirsteins, Luanna Salk, MD  Active   trolamine salicylate (ASPERCREME) 10 % cream [id number] No Apply 1 application topically as needed for muscle pain. [provider] Taking Active Self  valACYclovir (VALTREX) 500 MG tablet [id number] No Take 1 tablet (500 mg total) by mouth 2 (two) times daily. For 3 days when outbreaks.  Patient taking differently: Take 500 mg by mouth 2 (two) times daily as needed. For 3 days when outbreaks.   [name], Danielle G, MD Taking Active            Med Note Meyer Russel Feb 27, 2020  8:13 AM)    warfarin (COUMADIN) 3 MG tablet [id number] No TAKE AS DIRECTED BY ANTICOAGULATION CLINIC  Patient taking differently: Take 3 mg by mouth daily. TAKE AS DIRECTED BY  ANTICOAGULATION  CLINIC   [name], Danielle G, MD Taking Active            Med Note Meyer Russel Feb 27, 2020  8:13 AM)            Patient Active Problem List    Diagnosis Date Noted  . Pneumonia due to COVID-19 virus   . Bacteremia due to Gram-positive bacteria 08/29/2018  . Abscess   . Cellulitis of left breast 08/28/2018  . Cellulitis of left abdominal wall 08/28/2018  . Encounter for routine gynecological examination 04/28/2018  . CKD (chronic kidney disease), stage III ([place]) 09/27/2017  . Recurrent genital herpes 04/13/2017  . Chronic pain disorder 01/25/2017  . Malignant neoplasm of upper-outer quadrant of left breast in female, estrogen receptor positive ([place]) 12/16/2016  . Chronic knee pain 12/07/2016  . Chronic bilateral low back pain without sciatica 12/07/2016  . Peripheral musculoskeletal gait disorder 12/07/2016  . Encounter for therapeutic drug monitoring 06/16/2016  . Generalized osteoarthritis of multiple sites 05/04/2016  . Chronic anticoagulation 05/04/2016  . Stroke ([place]) 04/14/2016  . Aortic valve disease 04/10/2016  . S/P AVR 04/10/2016  . NICM (nonischemic cardiomyopathy) ([place]) 04/10/2016  . Upper airway cough syndrome 03/14/2016  . Morbid obesity ([place]) 03/14/2016  . COPD (chronic obstructive pulmonary disease) ([place]) 03/11/2016  . Thrombocytopenia ([place]) 12/12/2015  . Anemia 12/12/2015  . Vitamin D deficiency 12/12/2015  . Acute urinary retention 12/11/2015  . Osteoporosis 12/11/2015  . Hip fracture ([place]) 12/10/2015  . Aortic atherosclerosis ([place]) 12/10/2015  .  Lung blebs ([place]) 12/03/2015  . Pelvic fracture ([place]) 08/19/2015  . Fall at home 08/19/2015  . Essential hypertension 08/19/2015  . Mixed hyperlipidemia 08/19/2015  . Asthma 08/19/2015  . Paroxysmal atrial fibrillation ([place]) 08/19/2015  . Hyperthyroidism 08/19/2015  . Chronic HFrEF (heart failure with reduced ejection fraction) ([place])   . Coronary artery disease   . Aortic stenosis     Immunization History  Administered Date(s) Administered  . Fluad Quad(high Dose 65+) 11/20/2018  . Influenza, High Dose Seasonal PF 11/24/2016, 12/08/2017  .  Influenza-Unspecified 12/07/2015, 11/09/2019  . Moderna Sars-Covid-2 Vaccination 03/24/2019, 04/28/2019  . Pneumococcal Conjugate-13 11/13/2013, 11/21/2014, 12/15/2018  . Pneumococcal Polysaccharide-23 12/15/2012, 05/20/2016  . Zoster 03/01/2014  . Zoster Recombinat (Shingrix) 12/21/2017, 12/15/2018    Conditions to be addressed/monitored:  Hypertension, Hyperlipidemia, Atrial Fibrillation, Heart Failure, COPD, Asthma, Chronic Kidney Disease, Osteoporosis and Breast cancer, chronic pain, insomnia, and hyperthyroidism  Care Plan : CCM Pharmacy Care Plan  Updates made by Viona Gilmore, [address] since 06/26/2020 12:00 AM    Problem: Problem: Hypertension, Hyperlipidemia, Atrial Fibrillation, Heart Failure, COPD, Asthma, Chronic Kidney Disease, Osteoporosis and Breast cancer, chronic pain, insomnia, and hyperthyroidism     Long-Range Goal: Patient-Specific Goal   Start Date: 06/11/2020  Expected End Date: 06/11/2021  This Visit's Progress: On track  Priority: High  Note:   Current Barriers:  . Unable to independently monitor therapeutic efficacy . Unable to achieve control of cholesterol  . Suboptimal therapeutic regimen for COPD/asthma  Pharmacist Clinical Goal(s):  Marland Kitchen Patient will verbalize ability to afford treatment regimen . achieve adherence to monitoring guidelines and medication adherence to achieve therapeutic efficacy . achieve control of cholesterol as evidenced by next lipid panel  through collaboration with PharmD and provider.   Interventions: . 1:1 collaboration with [name], Danielle G, MD regarding development and update of comprehensive plan of care as evidenced by provider attestation and co-signature . Inter-disciplinary care team collaboration (see longitudinal plan of care) . Comprehensive medication review performed; medication list updated in electronic medical record  Hypertension (BP goal <140/90) -Controlled -Current treatment: . Carvedilol 46m, 1 tablet twice  daily -Medications previously tried: none  -Current home readings: 120/70, nothing > 140 -Current dietary habits: limits salt intake -Current exercise habits: limited in ability to exercise -Denies hypotensive/hypertensive symptoms -Educated on Importance of home blood pressure monitoring; Proper BP monitoring technique; Symptoms of hypotension and importance of maintaining adequate hydration; -Counseled to monitor BP at home weekly, document, and provide log at future appointments -Counseled on diet and exercise extensively Recommended to continue current medication  Hyperlipidemia/history of stroke: (LDL goal < 70) -Uncontrolled -Current treatment:  Rosuvastatin 416m 1 tablet once daily   Aspirin 8115m1 tablet once daily  -Medications previously tried: none  -Current dietary patterns: sometimes fries food, no processed meats, no read meat and uses canolia oil -Current exercise habits: limited in ability -Educated on Cholesterol goals;  Benefits of statin for ASCVD risk reduction; Importance of limiting foods high in cholesterol; Exercise goal of 150 minutes per week; -Counseled on diet and exercise extensively Recommended to continue current medication Recommended repeat lipid panel and if LDL > 70, consider additional lipid lowering therapy  Heart Failure (Goal: manage symptoms and prevent exacerbations) -Uncontrolled -Last ejection fraction: 45-50% (Date: 11/21/19) -HF type: Systolic -NYHA Class: III (marked limitation of activity) -AHA HF Stage: C (Heart disease and symptoms present) -Current treatment: . Entresto 49-51 mg 1 tablet twice daily . Furosemide 20 mg 1 tablet daily  Carvedilol 65m39m  tablet twice daily -Medications previously tried: losartan  -Current home BP/HR readings: never > 140 -Current dietary habits: limits salt intake; drinks only 2 bottles of water and a cup of coffee daily -Current exercise habits: limited in ability -Educated on Importance  of weighing daily; if you gain more than 3 pounds in one day or 5 pounds in one week, call cardiologist Proper diuretic administration and potassium supplementation Importance of blood pressure control -Recommended to continue current medication   Atrial Fibrillation (Goal: prevent stroke and major bleeding) -Controlled -CHADSVASC: 7 -Current treatment: . Rate control: carvedilol 25 mg 1 tablet twice daily . Anticoagulation: warfarin as directed by coumadin clinic  -Medications previously tried: none -Home BP and HR readings: nothing > 140  -Counseled on bleeding risk associated with warfarin and importance of self-monitoring for signs/symptoms of bleeding; avoidance of NSAIDs due to increased bleeding risk with anticoagulants; -Recommended to continue current medication  COPD/asthma (Goal: control symptoms and prevent exacerbations) -Uncontrolled -Current treatment  . Albuterol HFA inhaler, 2 puffs every six hours as needed for whezing or shortness of breath   Symbicort 160-4.55 mcg/act inhale 2 puffs twice daily - does not use or have -Medications previously tried: Dentist (switched to Symbicort) -Gold Grade: Gold 1 (FEV1>80%) -Current COPD Classification:  B (high sx, <2 exacerbations/yr) -MMRC/CAT score: n/a -Pulmonary function testing: n/a -Exacerbations requiring treatment in last 6 months: none -Patient denies consistent use of maintenance inhaler (only using once daily) -Frequency of rescue inhaler use: regularly -Counseled on Proper inhaler technique; Benefits of consistent maintenance inhaler use When to use rescue inhaler Differences between maintenance and rescue inhalers -Recommended to continue current medication Recommended for patient to get a prescription for Symbicort from PCP to take twice daily  Osteoporosis (Goal prevent fractures) -Controlled -Last DEXA Scan: 11/29/2018   T-Score femoral neck: -2.8  T-Score total hip: n/a  T-Score lumbar spine:  n/a  T-Score forearm radius: -1.7  10-year probability of major osteoporotic fracture: n/a  10-year probability of hip fracture: n/a -Patient is a candidate for pharmacologic treatment due to history of hip fracture  and T-Score < -2.5 in femoral neck -Current treatment   Alendronate 38m, 1 tablet once a week. (Saturday) - started 2017   Vitamin D 1000 units, 1 tablet once daily -Medications previously tried: none  -Recommend [id number] units of vitamin D daily. Recommend 1200 mg of calcium daily from dietary and supplemental sources. Counseled on oral bisphosphonate administration: take in the morning, 30 minutes prior to food with 6-8 oz of water. Do not lie down for at least 30 minutes after taking. Recommend weight-bearing and muscle strengthening exercises for building and maintaining bone density. -Recommended to continue current medication Educated on drug holiday after repeat bone density scan  Chronic pain (Goal: minimize symptoms) -Controlled -Current treatment   Diclofenac 1%gel, apply 4g four times daily   Aspercreme- as needed   APAP 508m 2 tablet daily as needed for pain   Tramadol 5057m1 tablet three times daily  -Medications previously tried: none  -Recommended to continue current medication  Hyperthyroidism (Goal: TSH 0.35-4.5) -Controlled -Current treatment  . Methimazole 5mg62m tablets every evening -Medications previously tried: none  -Recommended to continue current medication  Breast cancer (Goal: remission) -Controlled -Current treatment  . Tamoxifen 20mg25mtablet once daily (started 2019) -Medications previously tried: none  -Recommended to continue current medication  Insomnia (Goal: improve quality and quantity of sleep) -Not ideally controlled -Current treatment   Diphenhydramine- APAP 25-500mg,62mablet at bedtime  Melatonin 80m, 1 tablet at bedtime -Medications previously tried: none  -Counseled on avoidance of diphenhydramine or  Benadryl containing products due to risk of falls. Recommended trial of extended release melatonin.  GERD (Goal: minimize symptoms) -Controlled -Current treatment  . Pantoprazole 40 mg 1 tablet daily as needed -Medications previously tried: none  -Recommended to continue current medication   Health Maintenance -Vaccine gaps: none -Current therapy:  . Senokot-S 8.6-50 mg 2 tablets at bedtime . Valacyclovir 500 mg 1 tablet twice daily as needed for outbreaks -Educated on Cost vs benefit of each product must be carefully weighed by individual consumer -Patient is satisfied with current therapy and denies issues -Recommended to continue current medication  Patient Goals/Self-Care Activities . Patient will:  - take medications as prescribed check blood pressure weekly, document, and provide at future appointments weigh daily, and contact provider if weight gain of > 3 lbs in one day or > 5 lbs in one week engage in dietary modifications by limiting fried foods  Follow Up Plan: Telephone follow up appointment with care management team member scheduled for: 3 months      Medication Assistance: None required.  Patient affirms current coverage meets needs.  [place] [address] [place] [address]Phone: [phone]: [address] [address] [place]Phone: [phone]: [phone] Uses pill box? Yes - patient uses a weekly container (AM/PM) Pt endorses 95% compliance  We discussed: Current pharmacy is preferred with insurance plan and patient is satisfied with pharmacy services Patient decided to: Continue current medication management strategy  Care Plan and [name] Decision:  Patient agrees to Care Plan and Follow-up.  Plan: Telephone  follow up appointment with care management team member scheduled for:  3 months  MJeni Salles PharmD BPortersvillePharmacist LNewburgat [phone]

## 2020-06-17 ENCOUNTER — Other Ambulatory Visit: Payer: Self-pay

## 2020-06-17 ENCOUNTER — Encounter: Payer: Self-pay | Admitting: Physical Medicine & Rehabilitation

## 2020-06-17 ENCOUNTER — Encounter: Payer: Medicare Other | Attending: Physical Medicine & Rehabilitation | Admitting: Physical Medicine & Rehabilitation

## 2020-06-17 VITALS — BP 116/83 | HR 81 | Temp 98.6°F | Ht 67.0 in | Wt 248.0 lb

## 2020-06-17 DIAGNOSIS — M159 Polyosteoarthritis, unspecified: Secondary | ICD-10-CM | POA: Diagnosis not present

## 2020-06-17 DIAGNOSIS — G894 Chronic pain syndrome: Secondary | ICD-10-CM | POA: Insufficient documentation

## 2020-06-17 DIAGNOSIS — M47816 Spondylosis without myelopathy or radiculopathy, lumbar region: Secondary | ICD-10-CM | POA: Diagnosis not present

## 2020-06-17 MED ORDER — TRAMADOL HCL 50 MG PO TABS: 50.0000 mg | ORAL_TABLET | Freq: Three times a day (TID) | ORAL | 5 refills | Status: DC

## 2020-06-17 NOTE — Progress Notes (Signed)
Subjective:    Patient ID: Danielle [name], female    DOB: August 20, 1945, 75 y.o.   MRN: [id number]  Encounter Date: 05/16/2020     Editor: [name], MD (Physician)              RightL5 dorsal ramus., Right L4 and Right L3 medial branch radio frequency neurotomy under fluoroscopic guidance       HPI  75 year old female with morbid obesity and left hip replacement who had a fall approximately 3 years ago resulting in low back pain.  MRI of the lumbar spine at that time demonstrated multilevel degenerative disc as well as facet arthropathy worse at L4-5 greater than L5-S1.  The patient does not have any pain below the belt.  She denies any groin pain.  She does not have any lateral hip pain.  She has no pain radiating down the lower extremity, no bowel or bladder dysfunction. She ambulates with a walker and has pain with sit to stand transfers.  She states that she is comfortable in a sitting position and has pain with a standing position.  She gets some relief with tramadol 50 mg 3 times daily which she takes in conjunction with Tylenol The patient had greater than 50% relief on 2 occasions with bilateral L3-L4 medial branch and L5 dorsal ramus injections however radiofrequency neurotomy of these nerves on the right side last month did not produce any long-term benefit. Pain Inventory Average Pain 6 Pain Right Now 6 My pain is NOT ANSWERED  In the last 24 hours, has pain interfered with the following? General activity 6 Relation with others N/A Enjoyment of life N/A What TIME of day is your pain at its worst? morning , daytime, evening, night and varies Sleep (in general) Poor  Pain is worse with: walking, standing and some activites Pain improves with: medication Relief from Meds: 4  Family History  Problem Relation Age of Onset  . Diabetes Mother   . Heart attack Mother 89  . Diabetes Father   . Lung cancer Father   . Diabetes Sister   . Thyroid disease  Sister   . Diabetes Sister   . HIV Brother    Social History   Socioeconomic History  . Marital status: Married    Spouse name: Sherwood  . Number of children: 0  . Years of education: 78  . Highest education level: 12th grade  Occupational History  . Occupation: Retired in 2004  Tobacco Use  . Smoking status: Former Smoker    Packs/day: 0.25    Years: 10.00    Pack years: 2.50    Types: Cigarettes    Quit date: 03/01/2010    Years since quitting: 10.3  . Smokeless tobacco: Never Used  Vaping Use  . Vaping Use: Never used  Substance and Sexual Activity  . Alcohol use: No  . Drug use: No  . Sexual activity: Not Currently  Other Topics Concern  . Not on file  Social History Narrative   Lives with husband.  Ambulated independently.   Social Determinants of Health   Financial Resource Strain: Medium Risk  . Difficulty of Paying Living Expenses: Somewhat hard  Food Insecurity: No Food Insecurity  . Worried About Charity fundraiser in the Last Year: Never true  . Ran Out of Food in the Last Year: Never true  Transportation Needs: No Transportation Needs  . Lack of Transportation (Medical): No  . Lack of Transportation (Non-Medical): No  Physical Activity:  Sufficiently Active  . Days of Exercise per Week: 7 days  . Minutes of Exercise per Session: 30 min  Stress: No Stress Concern Present  . Feeling of Stress : Only a little  Social Connections: Moderately Integrated  . Frequency of Communication with Friends and Family: More than three times a week  . Frequency of Social Gatherings with Friends and Family: More than three times a week  . Attends Religious Services: More than 4 times per year  . Active Member of Clubs or Organizations: No  . Attends Archivist Meetings: Never  . Marital Status: Married   Past Surgical History:  Procedure Laterality Date  . BREAST LUMPECTOMY Left 12/2016  . BREAST LUMPECTOMY WITH RADIOACTIVE SEED AND SENTINEL LYMPH NODE  BIOPSY Left 01/14/2017   Procedure: LEFT BREAST LUMPECTOMY WITH RADIOACTIVE SEED AND SENTINEL LYMPH NODE BIOPSY;  Surgeon: Rolm Bookbinder, MD;  Location: [facility];  Service: General;  Laterality: Left;  . CHEST TUBE INSERTION  10/2015  . INTRAMEDULLARY (IM) NAIL INTERTROCHANTERIC Left 12/10/2015   Procedure: INTRAMEDULLARY (IM) NAIL INTERTROCHANTRIC;  Surgeon: Leandrew Koyanagi, MD;  Location: [facility];  Service: Orthopedics;  Laterality: Left;  . PLEURADESIS Left 12/03/2015   Procedure: PLEURADESIS;  Surgeon: Melrose Nakayama, MD;  Location: [facility];  Service: Thoracic;  Laterality: Left;  . RESECTION OF APICAL BLEB Left 12/03/2015   Procedure: BLEBECTOMY;  Surgeon: Melrose Nakayama, MD;  Location: [facility];  Service: Thoracic;  Laterality: Left;  . RIGHT/LEFT HEART CATH AND CORONARY ANGIOGRAPHY N/A 03/10/2020   Procedure: RIGHT/LEFT HEART CATH AND CORONARY ANGIOGRAPHY;  Surgeon: [name], MD;  Location: [facility];  Service: Cardiovascular;  Laterality: N/A;  . THORACOSCOPY  12/03/2015  . VALVE REPLACEMENT    . VIDEO ASSISTED THORACOSCOPY Left 12/03/2015   Procedure: VIDEO ASSISTED THORACOSCOPY;  Surgeon: Melrose Nakayama, MD;  Location: [facility];  Service: Thoracic;  Laterality: Left;   Past Surgical History:  Procedure Laterality Date  . BREAST LUMPECTOMY Left 12/2016  . BREAST LUMPECTOMY WITH RADIOACTIVE SEED AND SENTINEL LYMPH NODE BIOPSY Left 01/14/2017   Procedure: LEFT BREAST LUMPECTOMY WITH RADIOACTIVE SEED AND SENTINEL LYMPH NODE BIOPSY;  Surgeon: Rolm Bookbinder, MD;  Location: [facility];  Service: General;  Laterality: Left;  . CHEST TUBE INSERTION  10/2015  . INTRAMEDULLARY (IM) NAIL INTERTROCHANTERIC Left 12/10/2015   Procedure: INTRAMEDULLARY (IM) NAIL INTERTROCHANTRIC;  Surgeon: Leandrew Koyanagi, MD;  Location: [facility];  Service: Orthopedics;  Laterality: Left;  . PLEURADESIS Left 12/03/2015   Procedure: PLEURADESIS;  Surgeon: Melrose Nakayama, MD;  Location: [facility];   Service: Thoracic;  Laterality: Left;  . RESECTION OF APICAL BLEB Left 12/03/2015   Procedure: BLEBECTOMY;  Surgeon: Melrose Nakayama, MD;  Location: [facility];  Service: Thoracic;  Laterality: Left;  . RIGHT/LEFT HEART CATH AND CORONARY ANGIOGRAPHY N/A 03/10/2020   Procedure: RIGHT/LEFT HEART CATH AND CORONARY ANGIOGRAPHY;  Surgeon: [name], MD;  Location: [facility];  Service: Cardiovascular;  Laterality: N/A;  . THORACOSCOPY  12/03/2015  . VALVE REPLACEMENT    . VIDEO ASSISTED THORACOSCOPY Left 12/03/2015   Procedure: VIDEO ASSISTED THORACOSCOPY;  Surgeon: Melrose Nakayama, MD;  Location: [facility];  Service: Thoracic;  Laterality: Left;   Past Medical History:  Diagnosis Date  . Allergy   . Anxiety   . Aortic stenosis    Status post bioprosthetic AVR  . Arthritis    DJD  . Asthma   . Breast cancer ([facility]) 12/13/2016  Left breast  . Chronic systolic CHF (congestive heart failure) ([place])    EF 30% 04/2014  . COPD (chronic obstructive pulmonary disease) ([facility])   . Coronary artery disease    per pt, had LHC prior to AVR in [place] that did not show any blockages; no stents/bypass  . Gastritis   . Hyperlipidemia   . Hypertension   . Hyperthyroidism   . Multiple thyroid nodules   . Osteoporosis   . Paroxysmal atrial fibrillation ([facility])   . Pelvis fracture ([place]) 08/19/2015   MULTIPLE   . Personal history of radiation therapy 2018  . Spontaneous pneumothorax 11/28/2015   left   . Stroke ([place]) 2000   rt hand weak   BP 116/83   Pulse 81   Temp 98.6 F (37 C)   Ht 5\' 7"  (1.702 m)   Wt 248 lb (112.5 kg)   SpO2 94%   BMI 38.84 kg/m   Opioid Risk Score:   Fall Risk Score:  `1  Depression screen PHQ 2/9  Depression screen [facility] 2/9 06/17/2020 05/26/2020 05/16/2020 04/17/2020 12/28/2019 12/05/2019 08/24/2019  Decreased Interest 0 0 0 1 0 0 0  Down, Depressed, Hopeless 0 1 0 1 0 0 0  PHQ - 2 Score 0 1 0 2 0 0 0  Some recent data might be hidden      Review of  Systems  Constitutional: Negative.   HENT: Negative.   Eyes: Negative.   Respiratory: Negative.   Cardiovascular: Negative.   Gastrointestinal: Negative.   Endocrine: Negative.   Genitourinary: Negative.   Musculoskeletal: Positive for gait problem.       RIGHT KNEE PAIN  Skin: Negative.   Allergic/Immunologic: Negative.   Hematological: Negative.   Psychiatric/Behavioral: Negative.        Objective:   Physical Exam Vitals and nursing note reviewed.  Constitutional:      Appearance: She is obese.  HENT:     Head: Normocephalic and atraumatic.  Eyes:     Extraocular Movements: Extraocular movements intact.     Pupils: Pupils are equal, round, and reactive to light.  Musculoskeletal:     Comments: Tenderness with light palpation bilateral lumbar paraspinal area. No pain over the lateral hip bilaterally no pain over the PSIS or gluteus area.  Neurological:     Mental Status: She is alert and oriented to person, place, and time.     Gait: Gait abnormal.     Comments: Motor strength is 5/5 bilateral hip flexor and extensor ankle dorsiflexor Patient has pain with hip flexion bilaterally.  The pain is in the back however Negative straight leg raising Sensation intact bilateral lower extremity Ambulates with a rolling walker forward flexed posture  Psychiatric:        Mood and Affect: Mood normal.        Behavior: Behavior normal.   Limited left greater than right hip internal rotation        Assessment & Plan:  Morning.  Chronic low back pain secondary to degenerative disc disease as well as lumbar spondylosis.  She had good relief with lumbar medial branch blocks with did not respond well to lumbar radiofrequency procedure on the right side therefore we will cancel the left side  We will make referral to outpatient physical therapy to help with mobility I do not think any further imaging is needed at this time given that there is no change in her symptoms or location of  pain Continue tramadol 50 mg 3  times daily Follow-up in 21months

## 2020-06-17 NOTE — Patient Instructions (Signed)
Please do home exercise program

## 2020-06-18 ENCOUNTER — Ambulatory Visit: Payer: Medicare Other | Admitting: *Deleted

## 2020-06-18 DIAGNOSIS — F4329 Adjustment disorder with other symptoms: Secondary | ICD-10-CM

## 2020-06-18 DIAGNOSIS — G894 Chronic pain syndrome: Secondary | ICD-10-CM

## 2020-06-18 DIAGNOSIS — F4381 Prolonged grief disorder: Secondary | ICD-10-CM

## 2020-06-18 DIAGNOSIS — F321 Major depressive disorder, single episode, moderate: Secondary | ICD-10-CM

## 2020-06-18 NOTE — Patient Instructions (Signed)
Visit Information  PATIENT GOALS: Goals Addressed              This Visit's Progress   .  Reduce and Manage Symptoms of Depression, Grief and Loss. (pt-stated)   On track     Timeframe:  Short-Term Goal Priority:  High Start Date:    05/26/2020                       Expected End Date:   07/25/2020                 Follow Up Date:  07/02/2020 at 11:30am.   Patient Goals: . Continue to implement clinical interventions to help reduce and manage symptoms (I.e. Mindfulness Meditation Strategies, Relaxation Techniques, and Deep Breathing Exercises). . Continue to review and reference:  "Ways to Cope with Caregiver Burnout" and "Loneliness and Isolation - Health and safety inspector". . Try to exercise at least 2 to 3 times per week, or as tolerated, eat a healthy, well-balanced diet, drink plenty of fluids and obtain adequate rest. . Continue spending time talking or visiting with friends, family members, neighbors, congregation members, etc., to gain support and socialization.   . Attend bi-weekly telephonic counseling sessions with LCSW for support and reduction of symptoms. . Prepare for your deceased sister's upcoming birthday celebration and memorial service, scheduled for Friday, May 6th, by documenting fond memories, as well as journal your thoughts, feelings and emotions.        Patient verbalizes understanding of instructions provided today and agrees to view in [place].   Telephone follow up appointment with care management team member scheduled for: 07/02/2020 at 11:30am.  [name] LCSW Licensed Clinical Social Worker [place] [phone]

## 2020-06-18 NOTE — Chronic Care Management (AMB) (Signed)
Chronic Care Management    Clinical Social Work Note  06/18/2020 Name: Danielle [name] MRN: [id number] DOB: Dec 22, 1945  Danielle [name] is a 75 y.o. year old female who is a primary care patient of [name], [name], MD. The CCM team was consulted to assist the patient with chronic disease management and/or care coordination needs related to: Mental Health Counseling and Resources and Grief Counseling to reduce and manage symptoms of Chronic Obstructive Pulmonary Disease, Stage III Chronic Kidney Disease, Major Depression, Chronic Pain Disorder, Grief Reaction with Prolonged Bereavement.   Engaged with patient by telephone for follow up visit in response to provider referral for social work chronic care management and care coordination services.   Consent to Services:  The patient was given information about Chronic Care Management services, agreed to services, and gave verbal consent prior to initiation of services.  Please see initial visit note for detailed documentation.   Patient agreed to services and consent obtained.   Assessment: Review of patient past medical history, allergies, medications, and health status, including review of relevant consultants reports was performed today as part of a comprehensive evaluation and provision of chronic care management and care coordination services.     SDOH (Social Determinants of Health) assessments and interventions performed:    Advanced Directives Status: Not addressed in this encounter.  CCM Care Plan  Allergies  Allergen Reactions  . Lisinopril Cough  . Tetanus Toxoid Adsorbed Swelling    Arm swelling  . Other Cough    PEANUTS    Outpatient Encounter Medications as of 06/18/2020  Medication Sig Note  . albuterol (PROVENTIL HFA;VENTOLIN HFA) 108 (90 Base) MCG/ACT inhaler Inhale 2 puffs into the lungs every 6 (six) hours as needed for wheezing or shortness of breath.    Marland Kitchen alendronate (FOSAMAX) 70 MG tablet  Take 1 tablet (70 mg total) by mouth once a week. Take with a full glass of water on an empty stomach. 03/04/2020: Saturday  . ascorbic acid (VITAMIN C) 500 MG tablet Take 500 mg by mouth daily.   Marland Kitchen aspirin EC 81 MG tablet Take 81 mg daily by mouth.   Marland Kitchen atorvastatin (LIPITOR) 40 MG tablet Take 1 tablet by mouth daily.   . carvedilol (COREG) 25 MG tablet Take 25 mg by mouth 2 (two) times daily with a meal.   . Cholecalciferol 25 MCG (1000 UT) tablet Take 1,000 Units by mouth daily.   . diclofenac sodium (VOLTAREN) 1 % GEL Apply 4 g topically 4 (four) times daily.   . diphenhydramine-acetaminophen (TYLENOL PM) 25-500 MG TABS tablet Take 2 tablets by mouth at bedtime.   . fluticasone (FLONASE) 50 MCG/ACT nasal spray Place 1 spray into both nostrils daily.   . furosemide (LASIX) 20 MG tablet Take 2 tablets (40 mg total) by mouth daily.   Marland Kitchen MELATONIN PO Take 5 mg by mouth at bedtime.   . methimazole (TAPAZOLE) 5 MG tablet Take 3 tablets (15 mg total) by mouth daily.   . mometasone-formoterol (DULERA) 200-5 MCG/ACT AERO Inhale 2 puffs into the lungs 2 (two) times daily.   . pantoprazole (PROTONIX) 40 MG tablet Take 1 tablet (40 mg total) by mouth daily.   . rosuvastatin (CRESTOR) 40 MG tablet TAKE 1 TABLET BY MOUTH  DAILY   . sacubitril-valsartan (ENTRESTO) 49-51 MG Take 1 tablet by mouth 2 (two) times daily.   Marland Kitchen senna-docusate (SENOKOT-S) 8.6-50 MG tablet Take 2 tablets at bedtime by mouth.    . tamoxifen (NOLVADEX) 20 MG tablet Take  1 tablet by mouth once daily   . traMADol (ULTRAM) 50 MG tablet Take 1 tablet (50 mg total) by mouth 3 (three) times daily.   Marland Kitchen trolamine salicylate (ASPERCREME) 10 % cream Apply 1 application topically as needed for muscle pain.   . valACYclovir (VALTREX) 500 MG tablet Take 1 tablet (500 mg total) by mouth 2 (two) times daily. For 3 days when outbreaks. (Patient taking differently: Take 500 mg by mouth 2 (two) times daily as needed. For 3 days when outbreaks.)   .  warfarin (COUMADIN) 3 MG tablet TAKE AS DIRECTED BY ANTICOAGULATION CLINIC (Patient taking differently: Take 3 mg by mouth daily. TAKE AS DIRECTED BY  ANTICOAGULATION  CLINIC)    No facility-administered encounter medications on file as of 06/18/2020.    Patient Active Problem List   Diagnosis Date Noted  . Pneumonia due to COVID-19 virus   . Bacteremia due to Gram-positive bacteria 08/29/2018  . Abscess   . Cellulitis of left breast 08/28/2018  . Cellulitis of left abdominal wall 08/28/2018  . Encounter for routine gynecological examination 04/28/2018  . CKD (chronic kidney disease), stage III ([place]) 09/27/2017  . Recurrent genital herpes 04/13/2017  . Chronic pain disorder 01/25/2017  . Malignant neoplasm of upper-outer quadrant of left breast in female, estrogen receptor positive ([place]) 12/16/2016  . Chronic knee pain 12/07/2016  . Chronic bilateral low back pain without sciatica 12/07/2016  . Peripheral musculoskeletal gait disorder 12/07/2016  . Encounter for therapeutic drug monitoring 06/16/2016  . Generalized osteoarthritis of multiple sites 05/04/2016  . Chronic anticoagulation 05/04/2016  . Stroke ([place]) 04/14/2016  . Aortic valve disease 04/10/2016  . S/P AVR 04/10/2016  . NICM (nonischemic cardiomyopathy) ([place]) 04/10/2016  . Upper airway cough syndrome 03/14/2016  . Morbid obesity ([place]) 03/14/2016  . COPD (chronic obstructive pulmonary disease) ([place]) 03/11/2016  . Thrombocytopenia ([place]) 12/12/2015  . Anemia 12/12/2015  . Vitamin D deficiency 12/12/2015  . Acute urinary retention 12/11/2015  . Osteoporosis 12/11/2015  . Hip fracture ([place]) 12/10/2015  . Aortic atherosclerosis ([place]) 12/10/2015  . Lung blebs ([place]) 12/03/2015  . Pelvic fracture ([place]) 08/19/2015  . Fall at home 08/19/2015  . Essential hypertension 08/19/2015  . Mixed hyperlipidemia 08/19/2015  . Asthma 08/19/2015  . Paroxysmal atrial fibrillation ([place]) 08/19/2015  . Hyperthyroidism 08/19/2015  . Chronic  HFrEF (heart failure with reduced ejection fraction) ([place])   . Coronary artery disease   . Aortic stenosis     Conditions to be addressed/monitored:  Chronic Obstructive Pulmonary Disease, Stage III Chronic Kidney Disease, Major Depression, Chronic Pain Disorder, Grief Reaction with Prolonged Bereavement.   Limited Social Support, Mental Health Concerns, Social Isolation and Limited Access to Caregiver.  Care Plan : LCSW Plan of Care  Updates made by Francis Gaines, LCSW since 06/18/2020 12:00 AM    Problem: Reduce and Manage Symptoms of Depression, Grief and Loss.   Priority: High    Goal: Reduce and Manage Symptoms of Depression, Grief and Loss.   Start Date: 05/26/2020  Expected End Date: 07/25/2020  This Visit's Progress: On track  Recent Progress: On track  Priority: High  Note:   Current Barriers:  . Unable to independently manage symptoms of Depression and Grief. . Knowledge deficits related to accessing mental health providers in patient with Depression and Grief. . Patient is experiencing symptoms of Depression and Grief, which seem to be exacerbated by the recent death of her sister.     . Patient needs Support, Education, and Care Coordination  in order to meet unmet mental health needs. . Patient is unable to independently navigate community resource options without care coordination support. . Deceased sisters memorial service and birthday celebration are scheduled for Friday, May 6th, which may trigger depression and grief. Clinical Social Work [employer]):  Marland Kitchen Over the next 60 days, patient will work with LCSW weekly, by telephone, to reduce and manage symptoms of Depression and Grief, until connected for ongoing counseling resources within the community.  . Patient will implement clinical interventions discussed today to decrease symptoms of Depression and Grief, and increase knowledge and/or ability of: coping skills, healthy habits, self-management skills, and stress  reduction. . Over the next 60 days, patient will demonstrate improved adherence to prescribed treatment plan for Depression and Grief, as evidenced by a reduction in symptoms.  Interventions:  . Collaboration with [name], [name], MD regarding development and update of comprehensive plan of care as evidenced by provider attestation and co-signature. . Assessed patient's understanding, education, previous treatment and care coordination needs.  . Patient interviewed and appropriate assessments performed: Review of PHQ 2 and PHQ 9 Depression Screening scores. . Provided basic mental health support, education and interventions.  Nash Dimmer with appropriate clinical care team members regarding patient needs, requesting Nutrition/Dietary Consult from [name], [name], MD. . Discussed several options for long term counseling based on need and insurance.  . Assisted patient with narrowing down options for long-term counseling services and provided list of community providers in network with [employer].   . Reviewed mental health medications with patient prescribed by PCP and discussed compliance.  . Other interventions include: Motivational Interviewing, Solution-Focused Strategies, Mindfulness Meditation Strategies, Relaxation Techniques, and Deep Breathing Exercises, Brief Cognitive Behavioral Therapy, Grief Counseling, Emotional/Supportive Counseling, Psychotropic Medication Adherence Assessment, Sleep Hygiene Assessment, Psychoeducation, Health Education, Problem Solving and Teaching/Coaching Strategies.  . Mailed patient mental health counseling resources, list of community providers, coping strategies/techniques and list of support groups. Self-Care Activities: . Self administers medications as prescribed; Attends all scheduled provider appointments; Calls pharmacy for medication refills; Attends church and other social activities; Lives independently with husband and performs  ADL's/IADL's independently; Calls provider office for new questions and/or concerns; Motivated for treatment; Strong family and social support. Patient Goals: . Continue to implement clinical interventions to help reduce and manage symptoms (I.e. Mindfulness Meditation Strategies, Relaxation Techniques, and Deep Breathing Exercises). . Continue to review and reference:  "Ways to Cope with Caregiver Burnout" and "Loneliness and Isolation - Health and safety inspector". . Try to exercise at least 2 to 3 times per week, or as tolerated, eat a healthy, well-balanced diet, drink plenty of fluids and obtain adequate rest. . Continue spending time talking or visiting with friends, family members, neighbors, congregation members, etc., to gain support and socialization.   . Attend bi-weekly telephonic counseling sessions with LCSW for support and reduction of symptoms. . Prepare for your deceased sister's upcoming birthday celebration and memorial service, scheduled for Friday, May 6th, by documenting fond memories, as well as journal your thoughts, feelings and emotions. Follow Up Plan: Telephone follow up appointment with care management team member scheduled for: 07/02/2020 at 11:30am.      Follow Up Plan: 07/02/2020 at 11:30am.      [name] LCSW Licensed Clinical Social Worker [place] [phone]

## 2020-06-24 ENCOUNTER — Ambulatory Visit: Payer: Medicare Other

## 2020-06-24 DIAGNOSIS — I5022 Chronic systolic (congestive) heart failure: Secondary | ICD-10-CM

## 2020-06-24 DIAGNOSIS — I480 Paroxysmal atrial fibrillation: Secondary | ICD-10-CM

## 2020-06-24 DIAGNOSIS — J449 Chronic obstructive pulmonary disease, unspecified: Secondary | ICD-10-CM

## 2020-06-24 DIAGNOSIS — F321 Major depressive disorder, single episode, moderate: Secondary | ICD-10-CM | POA: Diagnosis not present

## 2020-06-24 DIAGNOSIS — G894 Chronic pain syndrome: Secondary | ICD-10-CM

## 2020-06-24 DIAGNOSIS — I10 Essential (primary) hypertension: Secondary | ICD-10-CM

## 2020-06-24 DIAGNOSIS — N183 Chronic kidney disease, stage 3 unspecified: Secondary | ICD-10-CM | POA: Diagnosis not present

## 2020-06-24 NOTE — Patient Instructions (Signed)
Visit Information   PATIENT GOALS:  Goals Addressed            This Visit's Progress   . Cope with Chronic Pain   On track    Timeframe:  Long-Range Goal Priority:  Medium Start Date:      06/24/20                       Expected End Date:        12/29/20               Follow Up Date 07/25/2020    - learn how to meditate - learn relaxation techniques - practice acceptance of chronic pain - practice relaxation or meditation daily - tell myself I can (not I can't) - think of new ways to do favorite things - use distraction techniques - use relaxation during pain    Why is this important?    Stress makes chronic pain feel worse.   Feelings like depression, anxiety, stress and anger can make your body more sensitive to pain.   Learning ways to cope with stress or depression may help you find some relief from the pain.     Notes:     . RNCM-Track and Manage Symptoms of cardiovascular disease(CHF, HTN,atrial fibrillation)   On track    Timeframe:  Long-Range Goal Priority:  Medium Start Date:     06/24/20                        Expected End Date:     12/29/20 Follow up date: 07/25/20                           - begin a heart failure diary - develop a rescue plan - eat more whole grains, fruits and vegetables, lean meats and healthy fats - follow rescue plan if symptoms flare-up - know when to call the doctor - track symptoms and what helps feel better or worse  -Call provider office for new concerns, questions, or BP outside discussed parameters -Follow a low sodium diet/DASH diet -check blood pressure 3 times per week - write blood pressure and weight results in a log or diary  -Plan to fill your medication box weekly  Why is this important?   You will be able to handle your symptoms better if you keep track of them.  Making some simple changes to your lifestyle will help.  Eating healthy is one thing you can do to take good care of yourself.         Heart  Failure, Self-Care Heart failure is a serious condition. The following information explains things you need to do to take care of yourself at home. To help you stay as healthy as possible, you may be asked to change your diet, take certain medicines, and make other changes in your life. Your doctor may also give you more specific instructions. If you have problems or questions, call your doctor. What are the risks? Having heart failure makes it more likely for you to have some problems. These problems can get worse if you do not take good care of yourself. Problems may include:  Damage to the kidneys, liver, or lungs.  Malnutrition.  Abnormal heart rhythms.  Blood clotting problems that could cause a stroke. Supplies needed:  Scale for weighing yourself.  Blood pressure monitor.  Notebook.  Medicines. How to care  for yourself when you have heart failure Medicines Take over-the-counter and prescription medicines only as told by your doctor. Take your medicines every day.  Do not stop taking your medicine unless your doctor tells you to do so.  Do not skip any medicines.  Get your prescriptions refilled before you run out of medicine. This is important.  Talk with your doctor if you cannot afford your medicines. Eating and drinking  Eat heart-healthy foods. Talk with a diet specialist (dietitian) to create an eating plan.  Limit salt (sodium) if told by your doctor. Ask your diet specialist to tell you which seasonings are healthy for your heart.  Cook in healthy ways instead of frying. Healthy ways of cooking include roasting, grilling, broiling, baking, poaching, steaming, and stir-frying.  Choose foods that: ? Have no trans fat. ? Are low in saturated fat and cholesterol.  Choose healthy foods, such as: ? Fresh or frozen fruits and vegetables. ? Fish. ? Low-fat (lean) meats. ? Legumes, such as beans, peas, and lentils. ? Fat-free or low-fat dairy  products. ? Whole-grain foods. ? High-fiber foods.  Limit how much fluid you drink, if told by your doctor.   Alcohol use  Do not drink alcohol if: ? Your doctor tells you not to drink. ? Your heart was damaged by alcohol, or you have very bad heart failure. ? You are pregnant, may be pregnant, or are planning to become pregnant.  If you drink alcohol: ? Limit how much you have to:  0-1 drink a day for women.  0-2 drinks a day for men. ? Know how much alcohol is in your drink. In the [place] one drink equals one 12 oz bottle of beer (355 mL), one 5 oz glass of wine (148 mL), or one 1 oz glass of hard liquor (44 mL). Lifestyle  Do not smoke or use any products that contain nicotine or tobacco. If you need help quitting, ask your doctor. ? Do not use nicotine gum or patches before talking to your doctor.  Do not use illegal drugs.  Lose weight if told by your doctor.  Do physical activity if told by your doctor. Talk to your doctor before you begin an exercise if: ? You are an older adult. ? You have very bad heart failure.  Learn to manage stress. If you need help, ask your doctor.  Get physical rehab (rehabilitation) to help you stay independent and to help with your quality of life.  Participate in a cardiac rehab program. This program helps you improve your health through exercise, education, and counseling.  Plan time to rest when you get tired.   Check weight and blood pressure  Weigh yourself every day. This will help you to know if fluid is building up in your body. ? Weigh yourself every morning after you pee (urinate) and before you eat breakfast. ? Wear the same amount of clothing each time. ? Write down your daily weight. Give your record to your doctor.  Check and write down your blood pressure as told by your doctor.  Check your pulse as told by your doctor.   Dealing with very hot and very cold weather  If it is very hot: ? Avoid activities that take a  lot of energy. ? Use air conditioning or fans, or find a cooler place. ? Avoid caffeine and alcohol. ? Wear clothing that is loose-fitting, lightweight, and light-colored.  If it is very cold: ? Avoid activities that take a lot of  energy. ? Layer your clothes. ? Wear mittens or gloves, a hat, and a face covering when you go outside. ? Avoid alcohol. Follow these instructions at home:  Stay up to date with shots (vaccines). Get pneumococcal and flu (influenza) shots.  Keep all follow-up visits. Contact a doctor if:  You gain 2-3 lb (1-1.4 kg) in 24 hours or 5 lb (2.3 kg) in a week.  You have increasing shortness of breath.  You cannot do your normal activities.  You get tired easily.  You cough a lot.  You do not feel like eating or feel like you may vomit (nauseous).  You have swelling in your hands, feet, ankles, or belly (abdomen).  You cannot sleep well because it is hard to breathe.  You feel like your heart is beating fast (palpitations).  You get dizzy when you stand up.  You feel depressed or sad. Get help right away if:  You have trouble breathing.  You or someone else notices a change in your behavior, such as having trouble staying awake.  You have chest pain or discomfort.  You pass out (faint). These symptoms may be an emergency. Get help right away. Call your local emergency services (911 in the [place]  Do not wait to see if the symptoms will go away.  Do not drive yourself to the hospital. Summary  Heart failure is a serious condition. To care for yourself, you may have to change your diet, take medicines, and make other lifestyle changes.  Take your medicines every day. Do not stop taking them unless your doctor tells you to do so.  Limit salt and eat heart-healthy foods.  Ask your doctor if you can drink alcohol. You may have to stop alcohol use if you have very bad heart failure.  Contact your doctor if you gain weight quickly or feel  that your heart is beating too fast. Get help right away if you pass out or have chest pain or trouble breathing. This information is not intended to replace advice given to you by your health care provider. Make sure you discuss any questions you have with your health care provider. Document Revised: 09/08/2019 Document Reviewed: 09/08/2019 Elsevier Patient Education  2021 [place].  Chronic Pain, Adult Chronic pain is a type of pain that lasts or keeps coming back for at least 3-6 months. You may have headaches, pain in the abdomen, or pain in other areas of the body. Chronic pain may be related to an illness, such as fibromyalgia or complex regional pain syndrome. Chronic pain may also be related to an injury or a health condition. Sometimes, the cause of chronic pain is not known. Chronic pain can make it hard for you to do daily activities. If not treated, chronic pain can lead to anxiety and depression. Treatment depends on the cause and severity of your pain. You may need to work with a pain specialist to come up with a treatment plan. The plan may include medicine, counseling, and physical therapy. Many people benefit from a combination of two or more types of treatment to control their pain. Follow these instructions at home: Medicines  Take over-the-counter and prescription medicines only as told by your health care provider.  Ask your health care provider if the medicine prescribed to you: ? Requires you to avoid driving or using machinery. ? Can cause constipation. You may need to take these actions to prevent or treat constipation:  Drink enough fluid to keep your urine pale  yellow.  Take over-the-counter or prescription medicines.  Eat foods that are high in fiber, such as beans, whole grains, and fresh fruits and vegetables.  Limit foods that are high in fat and processed sugars, such as fried or sweet foods. Treatment plan Follow your treatment plan as told by your  health care provider. This may include:  Gentle, regular exercise.  Eating a healthy diet that includes foods such as vegetables, fruits, fish, and lean meats.  Cognitive or behavioral therapy that changes the way you think or act in response to the pain. This may help improve how you feel.  Working with a physical therapist.  Meditation, yoga, acupuncture, or massage therapy.  Aroma, color, light, or sound therapy.  Local electrical stimulation. The electrical pulses help to relieve pain by temporarily stopping the nerve impulses that cause you to feel pain.  Injections. These deliver numbing or pain-relieving medicines into the spine or the area of pain.   Lifestyle  Ask your health care provider whether you should keep a pain diary. Your health care provider will tell you what information to write in the diary. This may include when you have pain, what the pain feels like, and how medicines and other behaviors or treatments help to reduce the pain.  Consider talking with a mental health care provider about how to manage chronic pain.  Consider joining a chronic pain support group.  Try to control or lower your stress levels. Talk with your health care provider about ways to do this.   General instructions  Learn as much as you can about how to manage your chronic pain. Ask your health care provider if an intensive pain rehabilitation program or a chronic pain specialist would be helpful.  Check your pain level as told by your health care provider. Ask your health care provider if you should use a pain scale.  It is up to you to get the results of any tests that were done. Ask your health care provider, or the department that is doing the tests, when your results will be ready.  Keep all follow-up visits as told by your health care provider. This is important. Contact a health care provider if:  Your pain gets worse, or you have new pain.  You have trouble sleeping.  You  have trouble doing your normal activities.  Your pain is not controlled with treatment.  You have side effects from pain medicine.  You feel weak.  You notice any other changes that show that your condition is getting worse. Get help right away if:  You lose feeling or have numbness in your body.  You lose control of bowel or bladder function.  Your pain suddenly gets much worse.  You develop shaking or chills.  You develop confusion.  You develop chest pain.  You have trouble breathing or shortness of breath.  You pass out.  You have thoughts about hurting yourself or others. If you ever feel like you may hurt yourself or others, or have thoughts about taking your own life, get help right away. Go to your nearest emergency department or:  Call your local emergency services (911 in the [place]  Call a suicide crisis helpline, such as the [facility] at 714-392-2624. This is open 24 hours a day in the [place]  Text the Crisis Text Line at 417 679 5612 (in the [place] Summary  Chronic pain is a type of pain that lasts or keeps coming back for at least 3-6 months.  Chronic pain may be related to an illness, injury, or other health condition. Sometimes, the cause of chronic pain is not known.  Treatment depends on the cause and severity of your pain.  Many people benefit from a combination of two or more types of treatment to control their pain.  Follow your treatment plan as told by your health care provider. This information is not intended to replace advice given to you by your health care provider. Make sure you discuss any questions you have with your health care provider. Document Revised: 11/02/2018 Document Reviewed: 11/02/2018 Elsevier Patient Education  [place].  Consent to CCM Services: Danielle [name] was given information about Chronic Care Management services today including:  1. CCM service includes personalized support  from designated clinical staff supervised by her physician, including individualized plan of care and coordination with other care providers 2. 24/7 contact phone numbers for assistance for urgent and routine care needs. 3. Service will only be billed when office clinical staff spend 20 minutes or more in a month to coordinate care. 4. Only one practitioner may furnish and bill the service in a calendar month. 5. The patient may stop CCM services at any time (effective at the end of the month) by phone call to the office staff. 6. The patient will be responsible for cost sharing (co-pay) of up to 20% of the service fee (after annual deductible is met).  Patient agreed to services and verbal consent obtained.   Patient verbalizes understanding of instructions provided today and agrees to view in [place].   Telephone follow up appointment with care management team member scheduled for: 07/25/20  Danielle Garter RN, [facility], CDE Care Management Coordinator [facility] [phone], Mobile [phone]  CLINICAL CARE PLAN: Patient Care Plan: RNCM: Cardiovascular Disease Management( Heart Failure, HTN, paroxysmal atrial fibrillation)    Problem Identified: Lack of long term management of Cardiovascular Disease( Heart Failure, HTN, paroxysmal atrial fibrillation)   Priority: Medium    Long-Range Goal: Long term self mangement of Cardiovascular Disease Management( Heart Failure, HTN, paroxysmal atrial fibrillation)   Start Date: 06/24/2020  Expected End Date: 12/29/2020  This Visit's Progress: On track  Recent Progress: On track  Priority: Medium  Note:   Current Barriers:  Marland Kitchen Knowledge deficits related to basic heart failure pathophysiology and self care management of Cardiovascular Disease Management( Heart Failure, HTN, paroxysmal atrial fibrillation)  . Does not adhere to provider recommendations re: low sodium diet at times . Pt reports weighting daily, denies any  chest pains, reports some swelling in legs and wears compression hose, reports shortness of breath with exertion, saw cardiologist Danielle [name] and she is back on Entresto, denies any recent atrial fibrillation, reports she has her blood checked regularly for her Coumadin, checks her B/P 4-5 times a week States she tries to use her exercise machine for her legs for 2000 steps, States she tries to follow a low sodium diet Nurse Case Manager Clinical Goal(s):   patient will weigh self daily and record  patient will verbalize understanding of Heart Failure Action Plan and when to call doctor  patient will take all Heart Failure mediations as prescribed Interventions:  . Collaboration with Danielle [name], Danielle G, MD regarding development and update of comprehensive plan of care as evidenced by provider attestation and co-signature . Inter-disciplinary care team collaboration (see longitudinal plan of care) . Basic overview and discussion of pathophysiology of Heart Failure . Provided written and verbal education on low sodium diet .  Reviewed Heart Failure Action Plan in depth and provided written copy . Assessed for scales in home-has scale . Discussed importance of daily weight . Reviewed role of diuretics in prevention of fluid overload . Reviewed upcoming provider appointments Dr. [name] 07/02/20, Danielle [name] cardiology 07/23/20 Self-Care Activities:  Takes Heart Failure Medications as prescribed Weighs daily and record (notifying MD of 3 lb weight gain over night or 5 lb in a week)  Patient Goals:  - Take Heart Failure Medications as prescribed - Weigh daily and record (notify MD with 3 lb weight gain over night or 5 lb in a week) - Follow CHF Action Plan - Adhere to low sodium diet - begin a heart failure diary - bring diary to all appointments - develop a rescue plan - eat more whole grains, fruits and vegetables, lean meats and healthy fats - follow rescue plan if symptoms flare-up - know when to call  the doctor - follow activity or exercise plan - make an activity or exercise plan - meet with physical therapist - pace activity allowing for rest - do ankle pumps when sitting - keep legs up while sitting - track weight in diary - use salt in moderation - watch for swelling in feet, ankles and legs every day -Calls provider office for new concerns, questions, or BP outside discussed parameters -Follows a low sodium diet/DASH diet -check blood pressure 3 times per week - write blood pressure and weight results in a log or diary  -Plan to fill your medication box weekly  Follow Up Plan: Telephone follow up appointment with care management team member scheduled for: 07/25/20 at 10 AM The patient has been provided with contact information for the care management team and has been advised to call with any health related questions or concerns.     Patient Care Plan: LCSW Plan of Care    Problem Identified: Reduce and Manage Symptoms of Depression, Grief and Loss.   Priority: High    Goal: Reduce and Manage Symptoms of Depression, Grief and Loss.   Start Date: 05/26/2020  Expected End Date: 07/25/2020  This Visit's Progress: On track  Recent Progress: On track  Priority: High  Note:   Current Barriers:  . Unable to independently manage symptoms of Depression and Grief. . Knowledge deficits related to accessing mental health providers in patient with Depression and Grief. . Patient is experiencing symptoms of Depression and Grief, which seem to be exacerbated by the recent death of her sister.     . Patient needs Support, Education, and Care Coordination in order to meet unmet mental health needs. . Patient is unable to independently navigate community resource options without care coordination support. . Deceased sisters memorial service and birthday celebration are scheduled for Friday, May 6th, which may trigger depression and grief. Clinical Social Work Danielle [name]):  Marland Kitchen Over the next 60  days, patient will work with LCSW weekly, by telephone, to reduce and manage symptoms of Depression and Grief, until connected for ongoing counseling resources within the community.  . Patient will implement clinical interventions discussed today to decrease symptoms of Depression and Grief, and increase knowledge and/or ability of: coping skills, healthy habits, self-management skills, and stress reduction. . Over the next 60 days, patient will demonstrate improved adherence to prescribed treatment plan for Depression and Grief, as evidenced by a reduction in symptoms.  Interventions:  . Collaboration with Danielle [name], Danielle G, MD regarding development and update of comprehensive plan of care as evidenced by provider attestation  and co-signature. . Assessed patient's understanding, education, previous treatment and care coordination needs.  . Patient interviewed and appropriate assessments performed: Review of PHQ 2 and PHQ 9 Depression Screening scores. . Provided basic mental health support, education and interventions.  Danielle [name] with appropriate clinical care team members regarding patient needs, requesting Nutrition/Dietary Consult from Danielle [name], Danielle G, MD. . Discussed several options for long term counseling based on need and insurance.  . Assisted patient with narrowing down options for long-term counseling services and provided list of community providers in network with [employer].   . Reviewed mental health medications with patient prescribed by PCP and discussed compliance.  . Other interventions include: Motivational Interviewing, Solution-Focused Strategies, Mindfulness Meditation Strategies, Relaxation Techniques, and Deep Breathing Exercises, Brief Cognitive Behavioral Therapy, Grief Counseling, Emotional/Supportive Counseling, Psychotropic Medication Adherence Assessment, Sleep Hygiene Assessment, Psychoeducation, Health Education, Problem Solving and Teaching/Coaching  Strategies.  . Mailed patient mental health counseling resources, list of community providers, coping strategies/techniques and list of support groups. Self-Care Activities: . Self administers medications as prescribed; Attends all scheduled provider appointments; Calls pharmacy for medication refills; Attends church and other social activities; Lives independently with husband and performs ADL's/IADL's independently; Calls provider office for new questions and/or concerns; Motivated for treatment; Strong family and social support. Patient Goals: . Continue to implement clinical interventions to help reduce and manage symptoms (I.e. Mindfulness Meditation Strategies, Relaxation Techniques, and Deep Breathing Exercises). . Continue to review and reference:  "Ways to Cope with Caregiver Burnout" and "Loneliness and Isolation - Health and safety inspector". . Try to exercise at least 2 to 3 times per week, or as tolerated, eat a healthy, well-balanced diet, drink plenty of fluids and obtain adequate rest. . Continue spending time talking or visiting with friends, family members, neighbors, congregation members, etc., to gain support and socialization.   . Attend bi-weekly telephonic counseling sessions with LCSW for support and reduction of symptoms. . Prepare for your deceased sister's upcoming birthday celebration and memorial service, scheduled for Friday, May 6th, by documenting fond memories, as well as journal your thoughts, feelings and emotions. Follow Up Plan: Telephone follow up appointment with care management team member scheduled for: 07/02/2020 at 11:30am.   Patient Care Plan: RNCM:Chronic Pain (Adult)    Problem Identified: Chronic Pain Management (Chronic Pain)   Priority: Medium    Long-Range Goal: Chronic Pain Managed   Start Date: 06/24/2020  Expected End Date: 12/29/2020  This Visit's Progress: On track  Priority: Medium  Note:   Current Barriers:  Marland Kitchen Knowledge Deficits related  to self-health management of acute or chronic pain of lower back due to degenerative disc disease  . Chronic Disease Management support and education needs related to chronic pain . Chronic Disease Management support and education needs related to chronic back pain . Does not adhere to provider recommendations re: exercise . Unable to perform IADLs independently uses walker  . Pt states she saw Dr. Read Drivers 06/17/20 and he put in a referral for her to go to PT to see if that will help with her back pain, states she is taking her Tramadol as ordered Clinical Goal(s):  . patient will verbalize understanding of plan for pain management. , patient will attend all scheduled medical appointments: Dr. [name] 07/02/20, patient will demonstrate use of different relaxation  skills and/or diversional activities to assist with pain reduction (distraction, imagery, relaxation, massage, acupressure, TENS, heat, and cold application., patient will report pain at a level less than 3 to 4 on  a 10-10 rating scale., patient will use pharmacological and nonpharmacological pain relief strategies as prescribed. , patient will verbalize acceptable level of pain relief and ability to engage in desired activities, and patient will engage in desired activities without an increase in pain level Interventions:  . Collaboration with Danielle [name], Danielle G, MD regarding development and update of comprehensive plan of care as evidenced by provider attestation and co-signature . Pain assessment performed . Medications reviewed . Instructed on methods of chronic pain control and provided written education on pain control . Discussed plans with patient for ongoing care management follow up and provided patient with direct contact information for care management team . Reviewed scheduled/upcoming provider appointments including: Dr. [name] 07/02/20, Dr. Read Drivers 09/16/20 . Social Work working with pt for grief counseling after death of  sister Patient Goals/Self Care Activities:  . Will self-administer medications as prescribed . Will attend all scheduled provider appointments . Will call pharmacy for medication refills 7 days prior to needed refill date . Patient will calls provider office for new concerns or questions .  learn how to meditate . learn relaxation techniques . practice acceptance of chronic pain .  practice relaxation or meditation daily .  tell myself I can (not I can't) . think of new ways to do favorite things .  use distraction techniques .  use relaxation during pain Follow Up Plan: Telephone follow up appointment with care management team member scheduled for: 07/25/20 at 10 AM The patient has been provided with contact information for the care management team and has been advised to call with any health related questions or concerns.

## 2020-06-24 NOTE — Chronic Care Management (AMB) (Signed)
Chronic Care Management   CCM RN Visit Note  06/24/2020 Name: Danielle [name] MRN: [id number] DOB: [birth year]  Subjective: Danielle [name] is a 75 y.o. year old female who is a primary care patient of [name], [name], MD. The care management team was consulted for assistance with disease management and care coordination needs.    Engaged with patient by telephone for initial visit in response to provider referral for case management and/or care coordination services.   Consent to Services:  The patient was given the following information about Chronic Care Management services today, agreed to services, and gave verbal consent: 1. CCM service includes personalized support from designated clinical staff supervised by the primary care provider, including individualized plan of care and coordination with other care providers 2. 24/7 contact phone numbers for assistance for urgent and routine care needs. 3. Service will only be billed when office clinical staff spend 20 minutes or more in a month to coordinate care. 4. Only one practitioner may furnish and bill the service in a calendar month. 5.The patient may stop CCM services at any time (effective at the end of the month) by phone call to the office staff. 6. The patient will be responsible for cost sharing (co-pay) of up to 20% of the service fee (after annual deductible is met). Patient agreed to services and consent obtained.  Patient agreed to services and verbal consent obtained.   Assessment: Review of patient past medical history, allergies, medications, health status, including review of consultants reports, laboratory and other test data, was performed as part of comprehensive evaluation and provision of chronic care management services.   SDOH (Social Determinants of Health) assessments and interventions performed:    CCM Care Plan  Allergies  Allergen Reactions  . Lisinopril Cough  . Tetanus Toxoid Adsorbed  Swelling    Arm swelling  . Other Cough    PEANUTS    Outpatient Encounter Medications as of 06/24/2020  Medication Sig Note  . albuterol (PROVENTIL HFA;VENTOLIN HFA) 108 (90 Base) MCG/ACT inhaler Inhale 2 puffs into the lungs every 6 (six) hours as needed for wheezing or shortness of breath.    Marland Kitchen alendronate (FOSAMAX) 70 MG tablet Take 1 tablet (70 mg total) by mouth once a week. Take with a full glass of water on an empty stomach. 03/04/2020: Saturday  . ascorbic acid (VITAMIN C) 500 MG tablet Take 500 mg by mouth daily.   Marland Kitchen aspirin EC 81 MG tablet Take 81 mg daily by mouth.   Marland Kitchen atorvastatin (LIPITOR) 40 MG tablet Take 1 tablet by mouth daily.   . carvedilol (COREG) 25 MG tablet Take 25 mg by mouth 2 (two) times daily with a meal.   . Cholecalciferol 25 MCG (1000 UT) tablet Take 1,000 Units by mouth daily.   . diclofenac sodium (VOLTAREN) 1 % GEL Apply 4 g topically 4 (four) times daily.   . diphenhydramine-acetaminophen (TYLENOL PM) 25-500 MG TABS tablet Take 2 tablets by mouth at bedtime.   . fluticasone (FLONASE) 50 MCG/ACT nasal spray Place 1 spray into both nostrils daily.   . furosemide (LASIX) 20 MG tablet Take 2 tablets (40 mg total) by mouth daily.   Marland Kitchen MELATONIN PO Take 5 mg by mouth at bedtime.   . methimazole (TAPAZOLE) 5 MG tablet Take 3 tablets (15 mg total) by mouth daily.   . mometasone-formoterol (DULERA) 200-5 MCG/ACT AERO Inhale 2 puffs into the lungs 2 (two) times daily.   . pantoprazole (PROTONIX) 40 MG tablet Take  1 tablet (40 mg total) by mouth daily.   . rosuvastatin (CRESTOR) 40 MG tablet TAKE 1 TABLET BY MOUTH  DAILY   . sacubitril-valsartan (ENTRESTO) 49-51 MG Take 1 tablet by mouth 2 (two) times daily.   Marland Kitchen senna-docusate (SENOKOT-S) 8.6-50 MG tablet Take 2 tablets at bedtime by mouth.    . tamoxifen (NOLVADEX) 20 MG tablet Take 1 tablet by mouth once daily   . traMADol (ULTRAM) 50 MG tablet Take 1 tablet (50 mg total) by mouth 3 (three) times daily.   Marland Kitchen trolamine  salicylate (ASPERCREME) 10 % cream Apply 1 application topically as needed for muscle pain.   . valACYclovir (VALTREX) 500 MG tablet Take 1 tablet (500 mg total) by mouth 2 (two) times daily. For 3 days when outbreaks. (Patient taking differently: Take 500 mg by mouth 2 (two) times daily as needed. For 3 days when outbreaks.)   . warfarin (COUMADIN) 3 MG tablet TAKE AS DIRECTED BY ANTICOAGULATION CLINIC (Patient taking differently: Take 3 mg by mouth daily. TAKE AS DIRECTED BY  ANTICOAGULATION  CLINIC)    No facility-administered encounter medications on file as of 06/24/2020.    Patient Active Problem List   Diagnosis Date Noted  . Pneumonia due to COVID-19 virus   . Bacteremia due to Gram-positive bacteria 08/29/2018  . Abscess   . Cellulitis of left breast 08/28/2018  . Cellulitis of left abdominal wall 08/28/2018  . Encounter for routine gynecological examination 04/28/2018  . CKD (chronic kidney disease), stage III ([place]) 09/27/2017  . Recurrent genital herpes 04/13/2017  . Chronic pain disorder 01/25/2017  . Malignant neoplasm of upper-outer quadrant of left breast in female, estrogen receptor positive ([place]) 12/16/2016  . Chronic knee pain 12/07/2016  . Chronic bilateral low back pain without sciatica 12/07/2016  . Peripheral musculoskeletal gait disorder 12/07/2016  . Encounter for therapeutic drug monitoring 06/16/2016  . Generalized osteoarthritis of multiple sites 05/04/2016  . Chronic anticoagulation 05/04/2016  . Stroke ([place]) 04/14/2016  . Aortic valve disease 04/10/2016  . S/P AVR 04/10/2016  . NICM (nonischemic cardiomyopathy) ([place]) 04/10/2016  . Upper airway cough syndrome 03/14/2016  . Morbid obesity ([place]) 03/14/2016  . COPD (chronic obstructive pulmonary disease) ([place]) 03/11/2016  . Thrombocytopenia ([place]) 12/12/2015  . Anemia 12/12/2015  . Vitamin D deficiency 12/12/2015  . Acute urinary retention 12/11/2015  . Osteoporosis 12/11/2015  . Hip fracture ([place])  12/10/2015  . Aortic atherosclerosis ([place]) 12/10/2015  . Lung blebs ([place]) 12/03/2015  . Pelvic fracture ([place]) 08/19/2015  . Fall at home 08/19/2015  . Essential hypertension 08/19/2015  . Mixed hyperlipidemia 08/19/2015  . Asthma 08/19/2015  . Paroxysmal atrial fibrillation ([place]) 08/19/2015  . Hyperthyroidism 08/19/2015  . Chronic HFrEF (heart failure with reduced ejection fraction) ([place])   . Coronary artery disease   . Aortic stenosis     Conditions to be addressed/monitored:CHF, HTN, HLD and chronic pain  Care Plan : RNCM: Cardiovascular Disease Management( Heart Failure, HTN, paroxysmal atrial fibrillation)  Updates made by Dimitri Ped, RN since 06/24/2020 12:00 AM    Problem: Lack of long term management of Cardiovascular Disease( Heart Failure, HTN, paroxysmal atrial fibrillation)   Priority: Medium    Long-Range Goal: Long term self mangement of Cardiovascular Disease Management( Heart Failure, HTN, paroxysmal atrial fibrillation)   Start Date: 06/24/2020  Expected End Date: 12/29/2020  This Visit's Progress: On track  Recent Progress: On track  Priority: Medium  Note:   Current Barriers:  Marland Kitchen Knowledge deficits related to basic heart  failure pathophysiology and self care management of Cardiovascular Disease Management( Heart Failure, HTN, paroxysmal atrial fibrillation)  . Does not adhere to provider recommendations re: low sodium diet at times . Pt reports weighting daily, denies any chest pains, reports some swelling in legs and wears compression hose, reports shortness of breath with exertion, saw cardiologist Dr. Saunders Revel and she is back on Entresto, denies any recent atrial fibrillation, reports she has her blood checked regularly for her Coumadin, checks her B/P 4-5 times a week States she tries to use her exercise machine for her legs for 2000 steps, States she tries to follow a low sodium diet Nurse Case Manager Clinical Goal(s):   patient will weigh self daily and  record  patient will verbalize understanding of Heart Failure Action Plan and when to call doctor  patient will take all Heart Failure mediations as prescribed Interventions:  . Collaboration with [name], [name], MD regarding development and update of comprehensive plan of care as evidenced by provider attestation and co-signature . Inter-disciplinary care team collaboration (see longitudinal plan of care) . Basic overview and discussion of pathophysiology of Heart Failure . Provided written and verbal education on low sodium diet . Reviewed Heart Failure Action Plan in depth and provided written copy . Assessed for scales in home-has scale . Discussed importance of daily weight . Reviewed role of diuretics in prevention of fluid overload . Reviewed upcoming provider appointments Dr. [name] 07/02/20, Dr. Saunders Revel cardiology 07/23/20 Self-Care Activities:  Takes Heart Failure Medications as prescribed Weighs daily and record (notifying MD of 3 lb weight gain over night or 5 lb in a week)  Patient Goals:  - Take Heart Failure Medications as prescribed - Weigh daily and record (notify MD with 3 lb weight gain over night or 5 lb in a week) - Follow CHF Action Plan - Adhere to low sodium diet - begin a heart failure diary - bring diary to all appointments - develop a rescue plan - eat more whole grains, fruits and vegetables, lean meats and healthy fats - follow rescue plan if symptoms flare-up - know when to call the doctor - follow activity or exercise plan - make an activity or exercise plan - meet with physical therapist - pace activity allowing for rest - do ankle pumps when sitting - keep legs up while sitting - track weight in diary - use salt in moderation - watch for swelling in feet, ankles and legs every day -Calls provider office for new concerns, questions, or BP outside discussed parameters -Follows a low sodium diet/DASH diet -check blood pressure 3 times per week - write  blood pressure and weight results in a log or diary  -Plan to fill your medication box weekly  Follow Up Plan: Telephone follow up appointment with care management team member scheduled for: 07/25/20 at 10 AM The patient has been provided with contact information for the care management team and has been advised to call with any health related questions or concerns.     Care Plan : RNCM:Chronic Pain (Adult)  Updates made by Dimitri Ped, RN since 06/24/2020 12:00 AM    Problem: Chronic Pain Management (Chronic Pain)   Priority: Medium    Long-Range Goal: Chronic Pain Managed   Start Date: 06/24/2020  Expected End Date: 12/29/2020  This Visit's Progress: On track  Priority: Medium  Note:   Current Barriers:  Marland Kitchen Knowledge Deficits related to self-health management of acute or chronic pain of lower back due to degenerative  disc disease  . Chronic Disease Management support and education needs related to chronic pain . Chronic Disease Management support and education needs related to chronic back pain . Does not adhere to provider recommendations re: exercise . Unable to perform IADLs independently uses walker  . Pt states she saw Dr. Read Drivers 06/17/20 and he put in a referral for her to go to PT to see if that will help with her back pain, states she is taking her Tramadol as ordered Clinical Goal(s):  . patient will verbalize understanding of plan for pain management. , patient will attend all scheduled medical appointments: Dr. [name] 07/02/20, patient will demonstrate use of different relaxation  skills and/or diversional activities to assist with pain reduction (distraction, imagery, relaxation, massage, acupressure, TENS, heat, and cold application., patient will report pain at a level less than 3 to 4 on a 10-10 rating scale., patient will use pharmacological and nonpharmacological pain relief strategies as prescribed. , patient will verbalize acceptable level of pain relief and ability to  engage in desired activities, and patient will engage in desired activities without an increase in pain level Interventions:  . Collaboration with [name], [name], MD regarding development and update of comprehensive plan of care as evidenced by provider attestation and co-signature . Pain assessment performed . Medications reviewed . Instructed on methods of chronic pain control and provided written education on pain control . Discussed plans with patient for ongoing care management follow up and provided patient with direct contact information for care management team . Reviewed scheduled/upcoming provider appointments including: Dr. [name] 07/02/20, Dr. Read Drivers 09/16/20 . Social Work working with pt for grief counseling after death of sister Patient Goals/Self Care Activities:  . Will self-administer medications as prescribed . Will attend all scheduled provider appointments . Will call pharmacy for medication refills 7 days prior to needed refill date . Patient will calls provider office for new concerns or questions .  learn how to meditate . learn relaxation techniques . practice acceptance of chronic pain .  practice relaxation or meditation daily .  tell myself I can (not I can't) . think of new ways to do favorite things .  use distraction techniques .  use relaxation during pain Follow Up Plan: Telephone follow up appointment with care management team member scheduled for: 07/25/20 at 10 AM The patient has been provided with contact information for the care management team and has been advised to call with any health related questions or concerns.        Plan:Telephone follow up appointment with care management team member scheduled for:  07/25/20 and The patient has been provided with contact information for the care management team and has been advised to call with any health related questions or concerns.  Peter Garter RN, Jackquline Denmark, CDE Care Management Coordinator [facility] [phone], Mobile [phone]

## 2020-06-26 NOTE — Patient Instructions (Signed)
Hi Danielle [name],  It was great to get to meet you over the telephone! Below is a summary of some of the topics we discussed.   I discussed some of the things we did with Dr. [name] ahead of your appointment so don't forget to get your cholesterol checked as well as get an inhaler prescription from her.  Please reach out to me if you have any questions or need anything before our follow up!  Best, Danielle [name]  Danielle [name], PharmD, [facility] [place]  Visit Information  Goals Addressed   None    Patient Care Plan: RNCM: Cardiovascular Disease Management( Heart Failure, HTN, paroxysmal atrial fibrillation)    Problem Identified: Lack of long term management of Cardiovascular Disease( Heart Failure, HTN, paroxysmal atrial fibrillation)   Priority: Medium    Long-Range Goal: Long term self mangement of Cardiovascular Disease Management( Heart Failure, HTN, paroxysmal atrial fibrillation)   Start Date: 06/24/2020  Expected End Date: 12/29/2020  This Visit's Progress: On track  Recent Progress: On track  Priority: Medium  Note:   Current Barriers:  Marland Kitchen Knowledge deficits related to basic heart failure pathophysiology and self care management of Cardiovascular Disease Management( Heart Failure, HTN, paroxysmal atrial fibrillation)  . Does not adhere to provider recommendations re: low sodium diet at times . Pt reports weighting daily, denies any chest pains, reports some swelling in legs and wears compression hose, reports shortness of breath with exertion, saw cardiologist Danielle [name] and she is back on Entresto, denies any recent atrial fibrillation, reports she has her blood checked regularly for her Coumadin, checks her B/P 4-5 times a week States she tries to use her exercise machine for her legs for 2000 steps, States she tries to follow a low sodium diet Nurse Case Manager Clinical Goal(s):   patient will weigh self daily and  record  patient will verbalize understanding of Heart Failure Action Plan and when to call doctor  patient will take all Heart Failure mediations as prescribed Interventions:  . Collaboration with Danielle [name], Danielle G, MD regarding development and update of comprehensive plan of care as evidenced by provider attestation and co-signature . Inter-disciplinary care team collaboration (see longitudinal plan of care) . Basic overview and discussion of pathophysiology of Heart Failure . Provided written and verbal education on low sodium diet . Reviewed Heart Failure Action Plan in depth and provided written copy . Assessed for scales in home-has scale . Discussed importance of daily weight . Reviewed role of diuretics in prevention of fluid overload . Reviewed upcoming provider appointments Dr. [name] 07/02/20, Danielle [name] cardiology 07/23/20 Self-Care Activities:  Takes Heart Failure Medications as prescribed Weighs daily and record (notifying MD of 3 lb weight gain over night or 5 lb in a week)  Patient Goals:  - Take Heart Failure Medications as prescribed - Weigh daily and record (notify MD with 3 lb weight gain over night or 5 lb in a week) - Follow CHF Action Plan - Adhere to low sodium diet - begin a heart failure diary - bring diary to all appointments - develop a rescue plan - eat more whole grains, fruits and vegetables, lean meats and healthy fats - follow rescue plan if symptoms flare-up - know when to call the doctor - follow activity or exercise plan - make an activity or exercise plan - meet with physical therapist - pace activity allowing for rest - do ankle pumps when sitting - keep legs up while sitting - track  weight in diary - use salt in moderation - watch for swelling in feet, ankles and legs every day -Calls provider office for new concerns, questions, or BP outside discussed parameters -Follows a low sodium diet/DASH diet -check blood pressure 3 times per week - write  blood pressure and weight results in a log or diary  -Plan to fill your medication box weekly  Follow Up Plan: Telephone follow up appointment with care management team member scheduled for: 07/25/20 at 10 AM The patient has been provided with contact information for the care management team and has been advised to call with any health related questions or concerns.     Patient Care Plan: LCSW Plan of Care    Problem Identified: Reduce and Manage Symptoms of Depression, Grief and Loss.   Priority: High    Goal: Reduce and Manage Symptoms of Depression, Grief and Loss.   Start Date: 05/26/2020  Expected End Date: 07/25/2020  This Visit's Progress: On track  Recent Progress: On track  Priority: High  Note:   Current Barriers:  . Unable to independently manage symptoms of Depression and Grief. . Knowledge deficits related to accessing mental health providers in patient with Depression and Grief. . Patient is experiencing symptoms of Depression and Grief, which seem to be exacerbated by the recent death of her sister.     . Patient needs Support, Education, and Care Coordination in order to meet unmet mental health needs. . Patient is unable to independently navigate community resource options without care coordination support. . Deceased sisters memorial service and birthday celebration are scheduled for Friday, May 6th, which may trigger depression and grief. Clinical Social Work [employer]):  Marland Kitchen Over the next 60 days, patient will work with LCSW weekly, by telephone, to reduce and manage symptoms of Depression and Grief, until connected for ongoing counseling resources within the community.  . Patient will implement clinical interventions discussed today to decrease symptoms of Depression and Grief, and increase knowledge and/or ability of: coping skills, healthy habits, self-management skills, and stress reduction. . Over the next 60 days, patient will demonstrate improved adherence to prescribed  treatment plan for Depression and Grief, as evidenced by a reduction in symptoms.  Interventions:  . Collaboration with Danielle [name], Danielle G, MD regarding development and update of comprehensive plan of care as evidenced by provider attestation and co-signature. . Assessed patient's understanding, education, previous treatment and care coordination needs.  . Patient interviewed and appropriate assessments performed: Review of PHQ 2 and PHQ 9 Depression Screening scores. . Provided basic mental health support, education and interventions.  Danielle [name] with appropriate clinical care team members regarding patient needs, requesting Nutrition/Dietary Consult from Danielle [name], Danielle G, MD. . Discussed several options for long term counseling based on need and insurance.  . Assisted patient with narrowing down options for long-term counseling services and provided list of community providers in network with [employer].   . Reviewed mental health medications with patient prescribed by PCP and discussed compliance.  . Other interventions include: Motivational Interviewing, Solution-Focused Strategies, Mindfulness Meditation Strategies, Relaxation Techniques, and Deep Breathing Exercises, Brief Cognitive Behavioral Therapy, Grief Counseling, Emotional/Supportive Counseling, Psychotropic Medication Adherence Assessment, Sleep Hygiene Assessment, Psychoeducation, Health Education, Problem Solving and Teaching/Coaching Strategies.  . Mailed patient mental health counseling resources, list of community providers, coping strategies/techniques and list of support groups. Self-Care Activities: . Self administers medications as prescribed; Attends all scheduled provider appointments; Calls pharmacy for medication refills; Attends church and other social activities; Lives independently  with husband and performs ADL's/IADL's independently; Calls provider office for new questions and/or concerns; Motivated for  treatment; Strong family and social support. Patient Goals: . Continue to implement clinical interventions to help reduce and manage symptoms (I.e. Mindfulness Meditation Strategies, Relaxation Techniques, and Deep Breathing Exercises). . Continue to review and reference:  "Ways to Cope with Caregiver Burnout" and "Loneliness and Isolation - Health and safety inspector". . Try to exercise at least 2 to 3 times per week, or as tolerated, eat a healthy, well-balanced diet, drink plenty of fluids and obtain adequate rest. . Continue spending time talking or visiting with friends, family members, neighbors, congregation members, etc., to gain support and socialization.   . Attend bi-weekly telephonic counseling sessions with LCSW for support and reduction of symptoms. . Prepare for your deceased sister's upcoming birthday celebration and memorial service, scheduled for Friday, May 6th, by documenting fond memories, as well as journal your thoughts, feelings and emotions. Follow Up Plan: Telephone follow up appointment with care management team member scheduled for: 07/02/2020 at 11:30am.   Patient Care Plan: RNCM:Chronic Pain (Adult)    Problem Identified: Chronic Pain Management (Chronic Pain)   Priority: Medium    Long-Range Goal: Chronic Pain Managed   Start Date: 06/24/2020  Expected End Date: 12/29/2020  This Visit's Progress: On track  Priority: Medium  Note:   Current Barriers:  Marland Kitchen Knowledge Deficits related to self-health management of acute or chronic pain of lower back due to degenerative disc disease  . Chronic Disease Management support and education needs related to chronic pain . Chronic Disease Management support and education needs related to chronic back pain . Does not adhere to provider recommendations re: exercise . Unable to perform IADLs independently uses walker  . Pt states she saw Dr. Read Drivers 06/17/20 and he put in a referral for her to go to PT to see if that will help  with her back pain, states she is taking her Tramadol as ordered Clinical Goal(s):  . patient will verbalize understanding of plan for pain management. , patient will attend all scheduled medical appointments: Dr. [name] 07/02/20, patient will demonstrate use of different relaxation  skills and/or diversional activities to assist with pain reduction (distraction, imagery, relaxation, massage, acupressure, TENS, heat, and cold application., patient will report pain at a level less than 3 to 4 on a 10-10 rating scale., patient will use pharmacological and nonpharmacological pain relief strategies as prescribed. , patient will verbalize acceptable level of pain relief and ability to engage in desired activities, and patient will engage in desired activities without an increase in pain level Interventions:  . Collaboration with Danielle [name], Danielle G, MD regarding development and update of comprehensive plan of care as evidenced by provider attestation and co-signature . Pain assessment performed . Medications reviewed . Instructed on methods of chronic pain control and provided written education on pain control . Discussed plans with patient for ongoing care management follow up and provided patient with direct contact information for care management team . Reviewed scheduled/upcoming provider appointments including: Dr. [name] 07/02/20, Dr. Read Drivers 09/16/20 . Social Work working with pt for grief counseling after death of sister Patient Goals/Self Care Activities:  . Will self-administer medications as prescribed . Will attend all scheduled provider appointments . Will call pharmacy for medication refills 7 days prior to needed refill date . Patient will calls provider office for new concerns or questions .  learn how to meditate . learn relaxation techniques . practice acceptance of chronic pain .  practice relaxation or meditation daily .  tell myself I can (not I can't) . think of new ways to do favorite  things .  use distraction techniques .  use relaxation during pain Follow Up Plan: Telephone follow up appointment with care management team member scheduled for: 07/25/20 at 10 AM The patient has been provided with contact information for the care management team and has been advised to call with any health related questions or concerns.      Patient Care Plan: CCM Pharmacy Care Plan    Problem Identified: Problem: Hypertension, Hyperlipidemia, Atrial Fibrillation, Heart Failure, COPD, Asthma, Chronic Kidney Disease, Osteoporosis and Breast cancer, chronic pain, insomnia, and hyperthyroidism     Long-Range Goal: Patient-Specific Goal   Start Date: 06/11/2020  Expected End Date: 06/11/2021  This Visit's Progress: On track  Priority: High  Note:   Current Barriers:  . Unable to independently monitor therapeutic efficacy . Unable to achieve control of cholesterol  . Suboptimal therapeutic regimen for COPD/asthma  Pharmacist Clinical Goal(s):  Marland Kitchen Patient will verbalize ability to afford treatment regimen . achieve adherence to monitoring guidelines and medication adherence to achieve therapeutic efficacy . achieve control of cholesterol as evidenced by next lipid panel  through collaboration with PharmD and provider.   Interventions: . 1:1 collaboration with Danielle [name], Danielle G, MD regarding development and update of comprehensive plan of care as evidenced by provider attestation and co-signature . Inter-disciplinary care team collaboration (see longitudinal plan of care) . Comprehensive medication review performed; medication list updated in electronic medical record  Hypertension (BP goal <140/90) -Controlled -Current treatment: . Carvedilol 25mg , 1 tablet twice daily -Medications previously tried: none  -Current home readings: 120/70, nothing > 140 -Current dietary habits: limits salt intake -Current exercise habits: limited in ability to exercise -Denies hypotensive/hypertensive  symptoms -Educated on Importance of home blood pressure monitoring; Proper BP monitoring technique; Symptoms of hypotension and importance of maintaining adequate hydration; -Counseled to monitor BP at home weekly, document, and provide log at future appointments -Counseled on diet and exercise extensively Recommended to continue current medication  Hyperlipidemia/history of stroke: (LDL goal < 70) -Uncontrolled -Current treatment:  Rosuvastatin 40mg , 1 tablet once daily   Aspirin 81mg , 1 tablet once daily  -Medications previously tried: none  -Current dietary patterns: sometimes fries food, no processed meats, no read meat and uses canolia oil -Current exercise habits: limited in ability -Educated on Cholesterol goals;  Benefits of statin for ASCVD risk reduction; Importance of limiting foods high in cholesterol; Exercise goal of 150 minutes per week; -Counseled on diet and exercise extensively Recommended to continue current medication Recommended repeat lipid panel and if LDL > 70, consider additional lipid lowering therapy  Heart Failure (Goal: manage symptoms and prevent exacerbations) -Uncontrolled -Last ejection fraction: 45-50% (Date: 11/21/19) -HF type: Systolic -NYHA Class: III (marked limitation of activity) -AHA HF Stage: C (Heart disease and symptoms present) -Current treatment: . Entresto 49-51 mg 1 tablet twice daily . Furosemide 20 mg 1 tablet daily  Carvedilol 25mg , 1 tablet twice daily -Medications previously tried: losartan  -Current home BP/HR readings: never > 140 -Current dietary habits: limits salt intake; drinks only 2 bottles of water and a cup of coffee daily -Current exercise habits: limited in ability -Educated on Importance of weighing daily; if you gain more than 3 pounds in one day or 5 pounds in one week, call cardiologist Proper diuretic administration and potassium supplementation Importance of blood pressure control -Recommended to  continue current medication  Atrial Fibrillation (Goal: prevent stroke and major bleeding) -Controlled -CHADSVASC: 7 -Current treatment: . Rate control: carvedilol 25 mg 1 tablet twice daily . Anticoagulation: warfarin as directed by coumadin clinic  -Medications previously tried: none -Home BP and HR readings: nothing > 140  -Counseled on bleeding risk associated with warfarin and importance of self-monitoring for signs/symptoms of bleeding; avoidance of NSAIDs due to increased bleeding risk with anticoagulants; -Recommended to continue current medication  COPD/asthma (Goal: control symptoms and prevent exacerbations) -Uncontrolled -Current treatment  . Albuterol HFA inhaler, 2 puffs every six hours as needed for whezing or shortness of breath   Symbicort 160-4.55 mcg/act inhale 2 puffs twice daily - does not use or have -Medications previously tried: Dentist (switched to Symbicort) -Gold Grade: Gold 1 (FEV1>80%) -Current COPD Classification:  B (high sx, <2 exacerbations/yr) -MMRC/CAT score: n/a -Pulmonary function testing: n/a -Exacerbations requiring treatment in last 6 months: none -Patient denies consistent use of maintenance inhaler (only using once daily) -Frequency of rescue inhaler use: regularly -Counseled on Proper inhaler technique; Benefits of consistent maintenance inhaler use When to use rescue inhaler Differences between maintenance and rescue inhalers -Recommended to continue current medication Recommended for patient to get a prescription for Symbicort from PCP to take twice daily  Osteoporosis (Goal prevent fractures) -Controlled -Last DEXA Scan: 11/29/2018   T-Score femoral neck: -2.8  T-Score total hip: n/a  T-Score lumbar spine: n/a  T-Score forearm radius: -1.7  10-year probability of major osteoporotic fracture: n/a  10-year probability of hip fracture: n/a -Patient is a candidate for pharmacologic treatment due to history of hip fracture  and  T-Score < -2.5 in femoral neck -Current treatment   Alendronate 70mg , 1 tablet once a week. (Saturday) - started 2017   Vitamin D 1000 units, 1 tablet once daily -Medications previously tried: none  -Recommend [phone] units of vitamin D daily. Recommend 1200 mg of calcium daily from dietary and supplemental sources. Counseled on oral bisphosphonate administration: take in the morning, 30 minutes prior to food with 6-8 oz of water. Do not lie down for at least 30 minutes after taking. Recommend weight-bearing and muscle strengthening exercises for building and maintaining bone density. -Recommended to continue current medication Educated on drug holiday after repeat bone density scan  Chronic pain (Goal: minimize symptoms) -Controlled -Current treatment   Diclofenac 1%gel, apply 4g four times daily   Aspercreme- as needed   APAP 500mg , 2 tablet daily as needed for pain   Tramadol 50mg , 1 tablet three times daily  -Medications previously tried: none  -Recommended to continue current medication  Hyperthyroidism (Goal: TSH 0.35-4.5) -Controlled -Current treatment  . Methimazole 5mg , 3 tablets every evening -Medications previously tried: none  -Recommended to continue current medication  Breast cancer (Goal: remission) -Controlled -Current treatment  . Tamoxifen 20mg , 1 tablet once daily (started 2019) -Medications previously tried: none  -Recommended to continue current medication  Insomnia (Goal: improve quality and quantity of sleep) -Not ideally controlled -Current treatment   Diphenhydramine- APAP 25-500mg , 2 tablet at bedtime   Melatonin 10mg , 1 tablet at bedtime -Medications previously tried: none  -Counseled on avoidance of diphenhydramine or Benadryl containing products due to risk of falls. Recommended trial of extended release melatonin.  GERD (Goal: minimize symptoms) -Controlled -Current treatment  . Pantoprazole 40 mg 1 tablet daily as  needed -Medications previously tried: none  -Recommended to continue current medication   Health Maintenance -Vaccine gaps: none -Current therapy:  . Senokot-S 8.6-50 mg 2 tablets at bedtime . Valacyclovir  500 mg 1 tablet twice daily as needed for outbreaks -Educated on Cost vs benefit of each product must be carefully weighed by individual consumer -Patient is satisfied with current therapy and denies issues -Recommended to continue current medication  Patient Goals/Self-Care Activities . Patient will:  - take medications as prescribed check blood pressure weekly, document, and provide at future appointments weigh daily, and contact provider if weight gain of > 3 lbs in one day or > 5 lbs in one week engage in dietary modifications by limiting fried foods  Follow Up Plan: Telephone follow up appointment with care management team member scheduled for: 3 months       Patient verbalizes understanding of instructions provided today and agrees to view in [place].  Telephone follow up appointment with pharmacy team member scheduled for: 3 months  Viona Gilmore, [facility]

## 2020-06-27 ENCOUNTER — Telehealth: Payer: Self-pay

## 2020-06-27 DIAGNOSIS — E782 Mixed hyperlipidemia: Secondary | ICD-10-CM

## 2020-06-27 NOTE — Telephone Encounter (Signed)
-----   Message from Viona Gilmore, [facility] sent at 06/26/2020  8:23 AM EDT ----- Regarding: Referral for nutritionist Hi,  When I spoke with Ms. Yashika she requested for a nutritionist referral. Could you please put that in? I think she wants it to be more specific for cholesterol.  Thanks, Maddie

## 2020-06-30 ENCOUNTER — Ambulatory Visit: Payer: Medicare Other

## 2020-07-02 ENCOUNTER — Other Ambulatory Visit: Payer: Self-pay

## 2020-07-02 ENCOUNTER — Ambulatory Visit (INDEPENDENT_AMBULATORY_CARE_PROVIDER_SITE_OTHER): Payer: Medicare Other | Admitting: Family Medicine

## 2020-07-02 ENCOUNTER — Encounter: Payer: Self-pay | Admitting: Family Medicine

## 2020-07-02 ENCOUNTER — Ambulatory Visit (INDEPENDENT_AMBULATORY_CARE_PROVIDER_SITE_OTHER): Payer: Medicare Other | Admitting: *Deleted

## 2020-07-02 VITALS — BP 120/80 | HR 80 | Temp 98.3°F | Resp 16 | Ht 67.0 in | Wt 251.0 lb

## 2020-07-02 DIAGNOSIS — N183 Chronic kidney disease, stage 3 unspecified: Secondary | ICD-10-CM | POA: Diagnosis not present

## 2020-07-02 DIAGNOSIS — E559 Vitamin D deficiency, unspecified: Secondary | ICD-10-CM

## 2020-07-02 DIAGNOSIS — I10 Essential (primary) hypertension: Secondary | ICD-10-CM

## 2020-07-02 DIAGNOSIS — E0590 Thyrotoxicosis, unspecified without thyrotoxic crisis or storm: Secondary | ICD-10-CM

## 2020-07-02 DIAGNOSIS — E782 Mixed hyperlipidemia: Secondary | ICD-10-CM | POA: Diagnosis not present

## 2020-07-02 DIAGNOSIS — J449 Chronic obstructive pulmonary disease, unspecified: Secondary | ICD-10-CM

## 2020-07-02 DIAGNOSIS — F4329 Adjustment disorder with other symptoms: Secondary | ICD-10-CM

## 2020-07-02 DIAGNOSIS — F339 Major depressive disorder, recurrent, unspecified: Secondary | ICD-10-CM

## 2020-07-02 DIAGNOSIS — N950 Postmenopausal bleeding: Secondary | ICD-10-CM

## 2020-07-02 DIAGNOSIS — M545 Low back pain, unspecified: Secondary | ICD-10-CM

## 2020-07-02 DIAGNOSIS — F4381 Prolonged grief disorder: Secondary | ICD-10-CM

## 2020-07-02 DIAGNOSIS — G4700 Insomnia, unspecified: Secondary | ICD-10-CM

## 2020-07-02 DIAGNOSIS — I700 Atherosclerosis of aorta: Secondary | ICD-10-CM | POA: Diagnosis not present

## 2020-07-02 DIAGNOSIS — E785 Hyperlipidemia, unspecified: Secondary | ICD-10-CM

## 2020-07-02 DIAGNOSIS — G8929 Other chronic pain: Secondary | ICD-10-CM

## 2020-07-02 DIAGNOSIS — M25562 Pain in left knee: Secondary | ICD-10-CM

## 2020-07-02 DIAGNOSIS — D696 Thrombocytopenia, unspecified: Secondary | ICD-10-CM | POA: Diagnosis not present

## 2020-07-02 LAB — LIPID PANEL
Cholesterol: 162 mg/dL (ref 0–200)
HDL: 73.7 mg/dL (ref >39.00)
LDL Cholesterol: 71 mg/dL (ref 0–99)
NonHDL: 88.61
Total CHOL/HDL Ratio: 2
Triglycerides: 90 mg/dL (ref 0.0–149.0)
VLDL: 18 mg/dL (ref 0.0–40.0)

## 2020-07-02 LAB — VITAMIN D 25 HYDROXY (VIT D DEFICIENCY, FRACTURES): VITD: 39.35 ng/mL (ref 30.00–100.00)

## 2020-07-02 LAB — TSH: TSH: 0.57 u[IU]/mL (ref 0.35–4.50)

## 2020-07-02 NOTE — Progress Notes (Signed)
Chief Complaint  Patient presents with  . Shortness of Breath  . Follow-up   HPI: [name] is a 75 y.o. female with hx of CHF,HTN,CKD,chronic anticoagulation,and chronic pain here today for follow up. She is not concerned about SOB, this is a chronic problem, unchanged for years.  She sees her cardiologist regularly, Dr End. Cardiac cath on 03/10/20:  1. No angiographically significant coronary artery disease. 2. Mildly elevated left heart, right heart, and pulmonary artery pressures. 3. Normal Fick cardiac output/index. 4. No significant stenosis of aortic valve bioprosthesis.  Echo on 11/21/19: LVEF 45-50%. Left ventricular global hypokinesis.  She does not exercise regularly, limitations due to chronic pain. Follows with pain management. Planning on starting PT next month. She uses a walker at home. She has an appt with nutritionist.  HTN: She is on Carvedilol 25 mg bid. HFrEF on Entresto 49-51 mg bid. Furosemide 20 mg bid. BP readings at home < 140/90.  CKD III: She has not noted gross hematuria,decreased urine output,or foam in urine. Negative for severe/frequent headache, visual changes, chest pain, palpitation, focal weakness, or worsening edema.  Vit D def: She is on Vit D 1000 U daily.  Lab Results  Component Value Date   CREATININE 1.31 (H) 05/21/2020   BUN 13 05/21/2020   NA 143 05/21/2020   K 4.9 05/21/2020   CL 110 05/21/2020   CO2 24 05/21/2020   COPD and asthma:  Symbicort 160-4.5 mcg qd prn. She is on Albuterol inh  Negative for wheezing and cough.  Hyperthyroidism and multinodular goiter : Dx'ed in 05/2014. She has been on amiodarone,hx of accidental toxicity,discontinued. She is on Methimazole 5 mg 3 tabs daily. She has been on same dose for years. She is not longer following with endocrinologist..  Lab Results  Component Value Date   TSH 2.14 01/30/2020   Insomnia: Chronic. Difficulty falling and staying  asleep. She has tried melatonin in the past.  HLD: She is on Crestor 40 mg daily, which she has tolerated better. Aortic atherosclerosis seen on imaging.  Lab Results  Component Value Date   CHOL 161 01/29/2019   HDL 64.90 01/29/2019   LDLCALC 81 01/29/2019   TRIG 77.0 01/29/2019   CHOLHDL 2 01/29/2019   Thrombocytopenia: Stable for years. She has not noted nose bleed, more bruising than usual,blood in stool,or melena.  Lab Results  Component Value Date   WBC 5.3 05/21/2020   HGB 12.4 05/21/2020   HCT 38.2 05/21/2020   MCV 94.3 05/21/2020   PLT 95 (L) 05/21/2020   Chronic anticoagulation: On Coumadin. Paroxysmal atrial fib and s/p AVR. Follows with coumadin clinic.  Requesting referral to gyn. Intermittent vaginal bleeding after sex intercourse. She has had problem for years. No associated pelvic pain. She feels like problem is getting worse. She has seen gyn in the past for same problem.  Review of Systems  Constitutional: Positive for fatigue. Negative for activity change, appetite change and fever.  HENT: Negative for mouth sores, nosebleeds and sore throat.   Eyes: Negative for redness and visual disturbance.  Gastrointestinal: Negative for abdominal pain, nausea and vomiting.       Negative for changes in bowel habits.  Genitourinary: Negative for dyspareunia, dysuria, genital sores and vaginal discharge.  Musculoskeletal: Positive for arthralgias, back pain and gait problem.  Skin: Negative for rash and wound.  Allergic/Immunologic: Positive for environmental allergies.  Neurological: Negative for syncope and facial asymmetry.  Psychiatric/Behavioral: Positive for sleep disturbance. Negative for confusion. The  patient is nervous/anxious.   Rest see pertinent positives and negatives per HPI.  Current Outpatient Medications on File Prior to Visit  Medication Sig Dispense Refill  . budesonide-formoterol (SYMBICORT) 160-4.5 MCG/ACT inhaler Inhale 2 puffs into the  lungs daily as needed.    Marland Kitchen albuterol (PROVENTIL HFA;VENTOLIN HFA) 108 (90 Base) MCG/ACT inhaler Inhale 2 puffs into the lungs every 6 (six) hours as needed for wheezing or shortness of breath.     Marland Kitchen alendronate (FOSAMAX) 70 MG tablet Take 1 tablet (70 mg total) by mouth once a week. Take with a full glass of water on an empty stomach. 13 tablet 3  . ascorbic acid (VITAMIN C) 500 MG tablet Take 500 mg by mouth daily.    Marland Kitchen aspirin EC 81 MG tablet Take 81 mg daily by mouth.    . carvedilol (COREG) 25 MG tablet Take 25 mg by mouth 2 (two) times daily with a meal.    . Cholecalciferol 25 MCG (1000 UT) tablet Take 1,000 Units by mouth daily.    . diclofenac sodium (VOLTAREN) 1 % GEL Apply 4 g topically 4 (four) times daily. 500 g 3  . diphenhydramine-acetaminophen (TYLENOL PM) 25-500 MG TABS tablet Take 2 tablets by mouth at bedtime.    . fluticasone (FLONASE) 50 MCG/ACT nasal spray Place 1 spray into both nostrils daily. 16 g 3  . furosemide (LASIX) 20 MG tablet Take 2 tablets (40 mg total) by mouth daily. 60 tablet 1  . MELATONIN PO Take 5 mg by mouth at bedtime.    . methimazole (TAPAZOLE) 5 MG tablet Take 3 tablets (15 mg total) by mouth daily. 270 tablet 2  . pantoprazole (PROTONIX) 40 MG tablet Take 1 tablet (40 mg total) by mouth daily. 30 tablet 0  . rosuvastatin (CRESTOR) 40 MG tablet TAKE 1 TABLET BY MOUTH  DAILY 90 tablet 3  . sacubitril-valsartan (ENTRESTO) 49-51 MG Take 1 tablet by mouth 2 (two) times daily.    Marland Kitchen senna-docusate (SENOKOT-S) 8.6-50 MG tablet Take 2 tablets at bedtime by mouth.     . tamoxifen (NOLVADEX) 20 MG tablet Take 1 tablet by mouth once daily 90 tablet 0  . traMADol (ULTRAM) 50 MG tablet Take 1 tablet (50 mg total) by mouth 3 (three) times daily. 90 tablet 5  . trolamine salicylate (ASPERCREME) 10 % cream Apply 1 application topically as needed for muscle pain.    . valACYclovir (VALTREX) 500 MG tablet Take 1 tablet (500 mg total) by mouth 2 (two) times daily. For 3  days when outbreaks. (Patient taking differently: Take 500 mg by mouth 2 (two) times daily as needed. For 3 days when outbreaks.) 24 tablet 1  . warfarin (COUMADIN) 3 MG tablet TAKE AS DIRECTED BY ANTICOAGULATION CLINIC (Patient taking differently: Take 3 mg by mouth daily. TAKE AS DIRECTED BY  ANTICOAGULATION  CLINIC) 90 tablet 2   No current facility-administered medications on file prior to visit.   Past Medical History:  Diagnosis Date  . Allergy   . Anxiety   . Aortic stenosis    Status post bioprosthetic AVR  . Arthritis    DJD  . Asthma   . Breast cancer ([place]) 12/13/2016   Left breast  . Chronic systolic CHF (congestive heart failure) ([place])    EF 30% 04/2014  . COPD (chronic obstructive pulmonary disease) ([place])   . Coronary artery disease    per pt, had LHC prior to AVR in [place] that did not show any blockages;  no stents/bypass  . Gastritis   . Hyperlipidemia   . Hypertension   . Hyperthyroidism   . Multiple thyroid nodules   . Osteoporosis   . Paroxysmal atrial fibrillation (HCC)   . Pelvis fracture ([place]) 08/19/2015   MULTIPLE   . Personal history of radiation therapy 2018  . Spontaneous pneumothorax 11/28/2015   left   . Stroke [facility] [place]) 2000   rt hand weak   Allergies  Allergen Reactions  . Lisinopril Cough  . Tetanus Toxoid Adsorbed Swelling    Arm swelling  . Other Cough    PEANUTS    Social History   Socioeconomic History  . Marital status: Married    Spouse name: Sherwood  . Number of children: 0  . Years of education: 52  . Highest education level: 12th grade  Occupational History  . Occupation: Retired in 2004  Tobacco Use  . Smoking status: Former Smoker    Packs/day: 0.25    Years: 10.00    Pack years: 2.50    Types: Cigarettes    Quit date: 03/01/2010    Years since quitting: 10.3  . Smokeless tobacco: Never Used  Vaping Use  . Vaping Use: Never used  Substance and Sexual Activity  . Alcohol use: No  . Drug use: No  . Sexual  activity: Not Currently  Other Topics Concern  . Not on file  Social History Narrative   Lives with husband.  Ambulated independently.   Social Determinants of Health   Financial Resource Strain: Medium Risk  . Difficulty of Paying Living Expenses: Somewhat hard  Food Insecurity: No Food Insecurity  . Worried About Charity fundraiser in the Last Year: Never true  . Ran Out of Food in the Last Year: Never true  Transportation Needs: No Transportation Needs  . Lack of Transportation (Medical): No  . Lack of Transportation (Non-Medical): No  Physical Activity: Sufficiently Active  . Days of Exercise per Week: 7 days  . Minutes of Exercise per Session: 30 min  Stress: No Stress Concern Present  . Feeling of Stress : Only a little  Social Connections: Moderately Integrated  . Frequency of Communication with Friends and Family: More than three times a week  . Frequency of Social Gatherings with Friends and Family: More than three times a week  . Attends Religious Services: More than 4 times per year  . Active Member of Clubs or Organizations: No  . Attends Archivist Meetings: Never  . Marital Status: Married    Vitals:   07/02/20 0923  BP: 120/80  Pulse: 80  Resp: 16  Temp: 98.3 F (36.8 C)  SpO2: 93%   Body mass index is 39.31 kg/m.  Physical Exam Vitals and nursing note reviewed.  Constitutional:      General: She is not in acute distress.    Appearance: She is well-developed.  HENT:     Head: Normocephalic and atraumatic.     Mouth/Throat:     Mouth: Mucous membranes are moist.     Pharynx: Oropharynx is clear.  Eyes:     Conjunctiva/sclera: Conjunctivae normal.  Cardiovascular:     Rate and Rhythm: Normal rate and regular rhythm.     Pulses:          Dorsalis pedis pulses are 2+ on the right side and 2+ on the left side.     Heart sounds: Murmur (SEM I/VI RUSB) heard.      Comments: LE with compression stocking.  Pulmonary:     Effort:  Pulmonary effort is normal. No respiratory distress.     Breath sounds: Normal breath sounds.  Abdominal:     Palpations: Abdomen is soft.     Tenderness: There is no abdominal tenderness.  Genitourinary:    Comments: Deferred to gyn. Musculoskeletal:     Right lower leg: 1+ Pitting Edema present.     Left lower leg: 1+ Pitting Edema present.  Skin:    General: Skin is warm.     Findings: No erythema or rash.  Neurological:     Mental Status: She is alert and oriented to person, place, and time.     Cranial Nerves: No cranial nerve deficit.     Comments: Unstable gait, antalgic,assisted with a walker.  Psychiatric:     Comments: Well groomed, good eye contact.   ASSESSMENT AND PLAN:  [name] was seen today for shortness of breath and follow-up.  Diagnoses and all orders for this visit: Orders Placed This Encounter  Procedures  . TSH  . Lipid panel  . VITAMIN D 25 Hydroxy (Vit-D Deficiency, Fractures)  . Microalbumin/Creatinine Ratio, Urine  . Ambulatory referral to Gynecology   Lab Results  Component Value Date   TSH 0.57 07/02/2020   Lab Results  Component Value Date   MICROALBUR 1.0 07/07/2020   MICROALBUR 1.9 01/04/2018     Lab Results  Component Value Date   CHOL 162 07/02/2020   HDL 73.70 07/02/2020   LDLCALC 71 07/02/2020   TRIG 90.0 07/02/2020   CHOLHDL 2 07/02/2020   Postmenopausal vaginal bleeding Chronic. Gyn referral placed.  Stage 3 chronic kidney disease, unspecified whether stage 3a or 3b CKD ([place]) Problem has been stable. Cr 1.10-1.3 and e GFR in the low 50's. Adequate BP controlled, low salt diet,and adequate hydration. Continue avoiding NSAID's.  Essential hypertension Re-check BP 120/80 RUE. Continue Entresto and carvedilol same dose. Continue monitoring BP and low salt diet.  Mixed hyperlipidemia Continue Crestor 40 mg daily. Further recommendations according to FLP result.  Vitamin D deficiency Continue Vit D 1000 U  daily, further recommendations according to 40 OH vit D result.  Chronic obstructive pulmonary disease, unspecified COPD type ([place]) Well controlled. Continue Symbicort 160-4.5 mcg bid prn and Albuterol inh prn.  Aortic atherosclerosis (HCC) Continue Crestor and Aspirin 81 mg daily.  Morbid obesity ([place]) We discussed benefits of wt loss as well as adverse effects of obesity. Consistency with healthy diet and physical activity recommended. She has appt with nutritionist.  Thrombocytopenia ([place]) Stable for year. She sees oncologist regularly.  Insomnia, unspecified type Good sleep hygiene. We discussed a few pharmacologic options but due to risk of med interaction and side effects I [refer to hold on this. Melatonin 2-3 hours before bed time and good sleep hygiene recommended for now.  Hyperthyroidism Continue Methimazole same dose. Further recommendations according to TSH result.  Spent 40 minutes with pt.  During this time history was obtained and documented, examination was performed, prior labs/imaging reviewed, and assessment/plan discussed.   Return in about 6 months (around 01/02/2021).   Arby Dahir G. [name], MD  [facility] [place] office.   A few things to remember from today's visit:   Postmenopausal vaginal bleeding - Plan: Ambulatory referral to Gynecology  Stage 3 chronic kidney disease, unspecified whether stage 3a or 3b CKD ([place]) - Plan: Microalbumin / creatinine urine ratio  Essential hypertension  Hyperlipidemia, unspecified hyperlipidemia type  Mixed hyperlipidemia - Plan: Lipid panel  Vitamin D deficiency -  Plan: VITAMIN D 25 Hydroxy (Vit-D Deficiency, Fractures)  Chronic obstructive pulmonary disease, unspecified COPD type ([place]), Chronic  Aortic atherosclerosis ([place]), Chronic  Morbid obesity ([place]), Chronic  Thrombocytopenia ([place]), Chronic  Insomnia, unspecified type  Hyperthyroidism - Plan: TSH  If you need refills please call  your pharmacy. Do not use My Chart to request refills or for acute issues that need immediate attention.   Continue Symbicort and Albuterol. Gynecology referral placed. Oncologist/hematologist is checking your blood cells and liver/kidney regularly. Continue Crestor. No changes in vit D dose.  For insomnia, good sleep hygiene and take melatonin 2-3 hours before bedtime with empty stomach. We do not have may options because your other meds and health problems.  Please be sure medication list is accurate. If a new problem present, please set up appointment sooner than planned today.

## 2020-07-02 NOTE — Assessment & Plan Note (Signed)
BP initially mildly elevated, rechecked and in normal range. No changes in current management.

## 2020-07-02 NOTE — Assessment & Plan Note (Signed)
Continue Vit D 1000 U daily.

## 2020-07-02 NOTE — Patient Instructions (Signed)
Visit Information  PATIENT GOALS: Goals Addressed              This Visit's Progress   .  Reduce and Manage Symptoms of Depression, Grief and Loss. (pt-stated)   On track     Timeframe:  Short-Term Goal Priority:  High Start Date:    05/26/2020                       Expected End Date:   07/25/2020                 Follow Up Date:  07/07/2020 at 3:00pm.   Patient Goals: . Continue to implement clinical interventions to help reduce and manage symptoms (I.e. Mindfulness Meditation Strategies, Relaxation Techniques, and Deep Breathing Exercises). . Try to exercise at least 2 to 3 times per week, or as tolerated, eat a healthy, well-balanced diet, drink plenty of fluids and obtain adequate rest. . Continue spending time talking or visiting with friends, family members, neighbors, congregation members, etc., to gain support and socialization.   . Attend bi-weekly telephonic counseling sessions with LCSW for support and reduction of symptoms. . Prepare for your deceased sister's upcoming birthday celebration and memorial service, scheduled for Friday, May 6th, by documenting fond memories, as well as journal your thoughts, feelings and emotions. Marland Kitchen Spend time with family and out-of-town guests, reminiscing about your deceased sisters' life and legacy.        Patient verbalizes understanding of instructions provided today and agrees to view in [place].   Telephone follow up appointment with care management team member scheduled for:  07/07/2020 at 3:00pm.  [name] LCSW Licensed Clinical Social Worker [place] [phone]

## 2020-07-02 NOTE — Patient Instructions (Addendum)
A few things to remember from today's visit:   Postmenopausal vaginal bleeding - Plan: Ambulatory referral to Gynecology  Stage 3 chronic kidney disease, unspecified whether stage 3a or 3b CKD ([place]) - Plan: Microalbumin / creatinine urine ratio  Essential hypertension  Hyperlipidemia, unspecified hyperlipidemia type  Mixed hyperlipidemia - Plan: Lipid panel  Vitamin D deficiency - Plan: VITAMIN D 25 Hydroxy (Vit-D Deficiency, Fractures)  Chronic obstructive pulmonary disease, unspecified COPD type ([place]), Chronic  Aortic atherosclerosis ([place]), Chronic  Morbid obesity ([place]), Chronic  Thrombocytopenia ([place]), Chronic  Insomnia, unspecified type  Hyperthyroidism - Plan: TSH  If you need refills please call your pharmacy. Do not use My Chart to request refills or for acute issues that need immediate attention.   Continue Symbicort and Albuterol. Gynecology referral placed. Oncologist/hematologist is checking your blood cells and liver/kidney regularly. Continue Crestor. No changes in vit D dose.  For insomnia, good sleep hygiene and take melatonin 2-3 hours before bedtime with empty stomach. We do not have may options because your other meds and health problems.  Please be sure medication list is accurate. If a new problem present, please set up appointment sooner than planned today.

## 2020-07-02 NOTE — Assessment & Plan Note (Signed)
Otherwise stable for years. Following with hematologist.

## 2020-07-02 NOTE — Assessment & Plan Note (Signed)
Continue Crestor 40 mg daily and low fat diet. Further recommendations according to FLP result.

## 2020-07-02 NOTE — Chronic Care Management (AMB) (Signed)
Chronic Care Management    Clinical Social Work Note  07/02/2020 Name: Danielle [name] MRN: [id number] DOB: 1946-01-07  Danielle [name] is a 75 y.o. year old female who is a primary care patient of [name], [name], MD. The CCM team was consulted to assist the patient with chronic disease management and/or care coordination needs related to: [place] and Resources and Grief Counseling in Patient with Chronic Obstructive Pulmonary Disease, Stage III Chronic Kidney Disease, Major Depression, Chronic Pain Disorder, Grief Reaction with Prolonged Bereavement.   Engaged with patient by telephone for follow up visit in response to provider referral for social work chronic care management and care coordination services.   Consent to Services:  The patient was given information about Chronic Care Management services, agreed to services, and gave verbal consent prior to initiation of services.  Please see initial visit note for detailed documentation.   Patient agreed to services and consent obtained.   Assessment: Review of patient past medical history, allergies, medications, and health status, including review of relevant consultants reports was performed today as part of a comprehensive evaluation and provision of chronic care management and care coordination services.     SDOH (Social Determinants of Health) assessments and interventions performed:    Advanced Directives Status: Not addressed in this encounter.  CCM Care Plan  Allergies  Allergen Reactions  . Lisinopril Cough  . Tetanus Toxoid Adsorbed Swelling    Arm swelling  . Other Cough    PEANUTS    Outpatient Encounter Medications as of 07/02/2020  Medication Sig Note  . albuterol (PROVENTIL HFA;VENTOLIN HFA) 108 (90 Base) MCG/ACT inhaler Inhale 2 puffs into the lungs every 6 (six) hours as needed for wheezing or shortness of breath.    Marland Kitchen alendronate (FOSAMAX) 70 MG tablet Take 1 tablet (70 mg  total) by mouth once a week. Take with a full glass of water on an empty stomach. 03/04/2020: Saturday  . ascorbic acid (VITAMIN C) 500 MG tablet Take 500 mg by mouth daily.   Marland Kitchen aspirin EC 81 MG tablet Take 81 mg daily by mouth.   Marland Kitchen atorvastatin (LIPITOR) 40 MG tablet Take 1 tablet by mouth daily.   . budesonide-formoterol (SYMBICORT) 160-4.5 MCG/ACT inhaler Inhale 2 puffs into the lungs daily as needed.   . carvedilol (COREG) 25 MG tablet Take 25 mg by mouth 2 (two) times daily with a meal.   . Cholecalciferol 25 MCG (1000 UT) tablet Take 1,000 Units by mouth daily.   . diclofenac sodium (VOLTAREN) 1 % GEL Apply 4 g topically 4 (four) times daily.   . diphenhydramine-acetaminophen (TYLENOL PM) 25-500 MG TABS tablet Take 2 tablets by mouth at bedtime.   . fluticasone (FLONASE) 50 MCG/ACT nasal spray Place 1 spray into both nostrils daily.   . furosemide (LASIX) 20 MG tablet Take 2 tablets (40 mg total) by mouth daily.   Marland Kitchen MELATONIN PO Take 5 mg by mouth at bedtime.   . methimazole (TAPAZOLE) 5 MG tablet Take 3 tablets (15 mg total) by mouth daily.   . pantoprazole (PROTONIX) 40 MG tablet Take 1 tablet (40 mg total) by mouth daily.   . rosuvastatin (CRESTOR) 40 MG tablet TAKE 1 TABLET BY MOUTH  DAILY   . sacubitril-valsartan (ENTRESTO) 49-51 MG Take 1 tablet by mouth 2 (two) times daily.   Marland Kitchen senna-docusate (SENOKOT-S) 8.6-50 MG tablet Take 2 tablets at bedtime by mouth.    . tamoxifen (NOLVADEX) 20 MG tablet Take 1 tablet by mouth  once daily   . traMADol (ULTRAM) 50 MG tablet Take 1 tablet (50 mg total) by mouth 3 (three) times daily.   Marland Kitchen trolamine salicylate (ASPERCREME) 10 % cream Apply 1 application topically as needed for muscle pain.   . valACYclovir (VALTREX) 500 MG tablet Take 1 tablet (500 mg total) by mouth 2 (two) times daily. For 3 days when outbreaks. (Patient taking differently: Take 500 mg by mouth 2 (two) times daily as needed. For 3 days when outbreaks.)   . warfarin (COUMADIN) 3 MG  tablet TAKE AS DIRECTED BY ANTICOAGULATION CLINIC (Patient taking differently: Take 3 mg by mouth daily. TAKE AS DIRECTED BY  ANTICOAGULATION  CLINIC)    No facility-administered encounter medications on file as of 07/02/2020.    Patient Active Problem List   Diagnosis Date Noted  . Pneumonia due to COVID-19 virus   . Bacteremia due to Gram-positive bacteria 08/29/2018  . Abscess   . Cellulitis of left breast 08/28/2018  . Cellulitis of left abdominal wall 08/28/2018  . Encounter for routine gynecological examination 04/28/2018  . CKD (chronic kidney disease), stage III ([place]) 09/27/2017  . Recurrent genital herpes 04/13/2017  . Chronic pain disorder 01/25/2017  . Malignant neoplasm of upper-outer quadrant of left breast in female, estrogen receptor positive ([place]) 12/16/2016  . Chronic knee pain 12/07/2016  . Chronic bilateral low back pain without sciatica 12/07/2016  . Peripheral musculoskeletal gait disorder 12/07/2016  . Encounter for therapeutic drug monitoring 06/16/2016  . Generalized osteoarthritis of multiple sites 05/04/2016  . Chronic anticoagulation 05/04/2016  . Stroke ([name]) 04/14/2016  . Aortic valve disease 04/10/2016  . S/P AVR 04/10/2016  . NICM (nonischemic cardiomyopathy) ([place]) 04/10/2016  . Upper airway cough syndrome 03/14/2016  . Morbid obesity ([place]) 03/14/2016  . COPD (chronic obstructive pulmonary disease) ([place]) 03/11/2016  . Thrombocytopenia ([place]) 12/12/2015  . Anemia 12/12/2015  . Vitamin D deficiency 12/12/2015  . Acute urinary retention 12/11/2015  . Osteoporosis 12/11/2015  . Hip fracture ([place]) 12/10/2015  . Aortic atherosclerosis ([place]) 12/10/2015  . Lung blebs ([place]) 12/03/2015  . Pelvic fracture ([place]) 08/19/2015  . Fall at home 08/19/2015  . Essential hypertension 08/19/2015  . Mixed hyperlipidemia 08/19/2015  . Asthma 08/19/2015  . Paroxysmal atrial fibrillation ([place]) 08/19/2015  . Hyperthyroidism 08/19/2015  . Chronic HFrEF (heart failure with  reduced ejection fraction) ([place])   . Coronary artery disease   . Aortic stenosis     Conditions to be addressed/monitored: Mental Health Counseling and Resources and Grief Counseling in Patient with Chronic Obstructive Pulmonary Disease, Stage III Chronic Kidney Disease, Major Depression, Chronic Pain Disorder, Grief Reaction with Prolonged Bereavement.  Limited Social Support, Mental Health Concerns, Social Isolation and Prolonged Grief.  Care Plan : LCSW Plan of Care  Updates made by Francis Gaines, LCSW since 07/02/2020 12:00 AM    Problem: Reduce and Manage Symptoms of Depression, Grief and Loss.   Priority: High    Goal: Reduce and Manage Symptoms of Depression, Grief and Loss.   Start Date: 05/26/2020  Expected End Date: 07/25/2020  This Visit's Progress: On track  Recent Progress: On track  Priority: High  Note:   Current Barriers:  . Unable to independently manage symptoms of Depression and Grief. . Knowledge deficits related to accessing mental health providers in patient with Depression and Grief. . Patient is experiencing symptoms of Depression and Grief, which seem to be exacerbated by the recent death of her sister.     . Patient needs Support, Education,  and Care Coordination in order to meet unmet mental health needs. . Patient is unable to independently navigate community resource options without care coordination support. . Deceased sisters memorial service and birthday celebration are scheduled for Friday, May 6th, which may trigger depression and grief. Clinical Social Work [employer]):  Marland Kitchen Over the next 60 days, patient will work with LCSW weekly, by telephone, to reduce and manage symptoms of Depression and Grief, until connected for ongoing counseling resources within the community.  . Patient will implement clinical interventions discussed today to decrease symptoms of Depression and Grief, and increase knowledge and/or ability of: coping skills, healthy habits,  self-management skills, and stress reduction. . Over the next 60 days, patient will demonstrate improved adherence to prescribed treatment plan for Depression and Grief, as evidenced by a reduction in symptoms.  Interventions:  . Collaboration with [name], [name], MD regarding development and update of comprehensive plan of care as evidenced by provider attestation and co-signature. . Assessed patient's understanding, education, previous treatment and care coordination needs.  . Patient interviewed and appropriate assessments performed: Review of PHQ 2 and PHQ 9 Depression Screening scores. . Provided basic mental health support, education and interventions.  Nash Dimmer with appropriate clinical care team members regarding patient needs, requesting Nutrition/Dietary Consult from [name], [name], MD. . Discussed several options for long term counseling based on need and insurance.  . Assisted patient with narrowing down options for long-term counseling services and provided list of community providers in network with [employer].   . Reviewed mental health medications with patient prescribed by PCP and discussed compliance.  . Other interventions include: Motivational Interviewing, Solution-Focused Strategies, Mindfulness Meditation Strategies, Relaxation Techniques, and Deep Breathing Exercises, Brief Cognitive Behavioral Therapy, Grief Counseling, Emotional/Supportive Counseling, Psychotropic Medication Adherence Assessment, Sleep Hygiene Assessment, Psychoeducation, Health Education, Problem Solving and Teaching/Coaching Strategies.  . Mailed patient mental health counseling resources, list of community providers, coping strategies/techniques and list of support groups. Self-Care Activities: . Self administers medications as prescribed; Attends all scheduled provider appointments; Calls pharmacy for medication refills; Attends church and other social activities; Lives  independently with husband and performs ADL's/IADL's independently; Calls provider office for new questions and/or concerns; Motivated for treatment; Strong family and social support. Patient Goals: . Continue to implement clinical interventions to help reduce and manage symptoms (I.e. Mindfulness Meditation Strategies, Relaxation Techniques, and Deep Breathing Exercises). . Try to exercise at least 2 to 3 times per week, or as tolerated, eat a healthy, well-balanced diet, drink plenty of fluids and obtain adequate rest. . Continue spending time talking or visiting with friends, family members, neighbors, congregation members, etc., to gain support and socialization.   . Attend bi-weekly telephonic counseling sessions with LCSW for support and reduction of symptoms. . Prepare for your deceased sister's upcoming birthday celebration and memorial service, scheduled for Friday, May 6th, by documenting fond memories, as well as journal your thoughts, feelings and emotions. Marland Kitchen Spend time with family and out-of-town guests, reminiscing about your deceased sisters' life and legacy. Follow Up Plan: Telephone follow up appointment with care management team member scheduled for: 07/07/2020 at 3:00pm.    Follow Up Plan: 07/07/2020 at 3:00pm.      [name] LCSW Licensed Clinical Social Worker [place] [phone]

## 2020-07-07 ENCOUNTER — Ambulatory Visit: Payer: Medicare Other | Admitting: *Deleted

## 2020-07-07 ENCOUNTER — Other Ambulatory Visit: Payer: Self-pay

## 2020-07-07 ENCOUNTER — Ambulatory Visit (INDEPENDENT_AMBULATORY_CARE_PROVIDER_SITE_OTHER): Payer: Medicare Other | Admitting: General Practice

## 2020-07-07 ENCOUNTER — Other Ambulatory Visit: Payer: Medicare Other

## 2020-07-07 DIAGNOSIS — N183 Chronic kidney disease, stage 3 unspecified: Secondary | ICD-10-CM

## 2020-07-07 DIAGNOSIS — F4329 Adjustment disorder with other symptoms: Secondary | ICD-10-CM

## 2020-07-07 DIAGNOSIS — F339 Major depressive disorder, recurrent, unspecified: Secondary | ICD-10-CM

## 2020-07-07 DIAGNOSIS — Z7901 Long term (current) use of anticoagulants: Secondary | ICD-10-CM | POA: Diagnosis not present

## 2020-07-07 DIAGNOSIS — F4381 Prolonged grief disorder: Secondary | ICD-10-CM

## 2020-07-07 LAB — MICROALBUMIN / CREATININE URINE RATIO
Creatinine,U: 70.4 mg/dL
Microalb Creat Ratio: 1.4 mg/g (ref 0.0–30.0)
Microalb, Ur: 1 mg/dL (ref 0.0–1.9)

## 2020-07-07 LAB — POCT INR: INR: 3.4 — AB (ref 2.0–3.0)

## 2020-07-07 NOTE — Patient Instructions (Addendum)
Pre visit review using our clinic review tool, if applicable. No additional management support is needed unless otherwise documented below in the visit note.  Skip dosage today and then continue to take 1 (3 mg) tablet daily.  Re-check in 3 to 4 weeks.

## 2020-07-07 NOTE — Chronic Care Management (AMB) (Signed)
Chronic Care Management    Clinical Social Work Note  07/07/2020 Name: Danielle [name] MRN: [id number] DOB: [birth year]  Danielle [name] is a 75 y.o. year old female who is a primary care patient of Danielle, [name], MD. The CCM team was consulted to assist the patient with chronic disease management and/or care coordination needs related to: Grief Counseling for Patient with Chronic Obstructive Pulmonary Disease, Stage III Chronic Kidney Disease, Major Depression, Chronic Pain Disorder, Grief Reaction with Prolonged Bereavement.  Engaged with patient by telephone for follow up visit in response to provider referral for social work chronic care management and care coordination services.   Consent to Services:  The patient was given information about Chronic Care Management services, agreed to services, and gave verbal consent prior to initiation of services.  Please see initial visit note for detailed documentation.   Patient agreed to services and consent obtained.   Assessment: Review of patient past medical history, allergies, medications, and health status, including review of relevant consultants reports was performed today as part of a comprehensive evaluation and provision of chronic care management and care coordination services.     SDOH (Social Determinants of Health) assessments and interventions performed:    Advanced Directives Status: Not addressed in this encounter.  CCM Care Plan  Allergies  Allergen Reactions  . Lisinopril Cough  . Tetanus Toxoid Adsorbed Swelling    Arm swelling  . Other Cough    PEANUTS    Outpatient Encounter Medications as of 07/07/2020  Medication Sig Note  . albuterol (PROVENTIL HFA;VENTOLIN HFA) 108 (90 Base) MCG/ACT inhaler Inhale 2 puffs into the lungs every 6 (six) hours as needed for wheezing or shortness of breath.    Marland Kitchen alendronate (FOSAMAX) 70 MG tablet Take 1 tablet (70 mg total) by mouth once a week. Take with a  full glass of water on an empty stomach. 03/04/2020: Saturday  . ascorbic acid (VITAMIN C) 500 MG tablet Take 500 mg by mouth daily.   Marland Kitchen aspirin EC 81 MG tablet Take 81 mg daily by mouth.   . budesonide-formoterol (SYMBICORT) 160-4.5 MCG/ACT inhaler Inhale 2 puffs into the lungs daily as needed.   . carvedilol (COREG) 25 MG tablet Take 25 mg by mouth 2 (two) times daily with a meal.   . Cholecalciferol 25 MCG (1000 UT) tablet Take 1,000 Units by mouth daily.   . diclofenac sodium (VOLTAREN) 1 % GEL Apply 4 g topically 4 (four) times daily.   . diphenhydramine-acetaminophen (TYLENOL PM) 25-500 MG TABS tablet Take 2 tablets by mouth at bedtime.   . fluticasone (FLONASE) 50 MCG/ACT nasal spray Place 1 spray into both nostrils daily.   . furosemide (LASIX) 20 MG tablet Take 2 tablets (40 mg total) by mouth daily.   Marland Kitchen MELATONIN PO Take 5 mg by mouth at bedtime.   . methimazole (TAPAZOLE) 5 MG tablet Take 3 tablets (15 mg total) by mouth daily.   . pantoprazole (PROTONIX) 40 MG tablet Take 1 tablet (40 mg total) by mouth daily.   . rosuvastatin (CRESTOR) 40 MG tablet TAKE 1 TABLET BY MOUTH  DAILY   . sacubitril-valsartan (ENTRESTO) 49-51 MG Take 1 tablet by mouth 2 (two) times daily.   Marland Kitchen senna-docusate (SENOKOT-S) 8.6-50 MG tablet Take 2 tablets at bedtime by mouth.    . tamoxifen (NOLVADEX) 20 MG tablet Take 1 tablet by mouth once daily   . traMADol (ULTRAM) 50 MG tablet Take 1 tablet (50 mg total) by mouth 3 (three) times  daily.   . trolamine salicylate (ASPERCREME) 10 % cream Apply 1 application topically as needed for muscle pain.   . valACYclovir (VALTREX) 500 MG tablet Take 1 tablet (500 mg total) by mouth 2 (two) times daily. For 3 days when outbreaks. (Patient taking differently: Take 500 mg by mouth 2 (two) times daily as needed. For 3 days when outbreaks.)   . warfarin (COUMADIN) 3 MG tablet TAKE AS DIRECTED BY ANTICOAGULATION CLINIC (Patient taking differently: Take 3 mg by mouth daily. TAKE  AS DIRECTED BY  ANTICOAGULATION  CLINIC)    No facility-administered encounter medications on file as of 07/07/2020.    Patient Active Problem List   Diagnosis Date Noted  . Pneumonia due to COVID-19 virus   . Bacteremia due to Gram-positive bacteria 08/29/2018  . Abscess   . Cellulitis of left breast 08/28/2018  . Cellulitis of left abdominal wall 08/28/2018  . Encounter for routine gynecological examination 04/28/2018  . CKD (chronic kidney disease), stage III ([place]) 09/27/2017  . Recurrent genital herpes 04/13/2017  . Chronic pain disorder 01/25/2017  . Malignant neoplasm of upper-outer quadrant of left breast in female, estrogen receptor positive ([place]) 12/16/2016  . Chronic knee pain 12/07/2016  . Chronic bilateral low back pain without sciatica 12/07/2016  . Peripheral musculoskeletal gait disorder 12/07/2016  . Encounter for therapeutic drug monitoring 06/16/2016  . Generalized osteoarthritis of multiple sites 05/04/2016  . Chronic anticoagulation 05/04/2016  . Stroke ([place]) 04/14/2016  . Aortic valve disease 04/10/2016  . S/P AVR 04/10/2016  . NICM (nonischemic cardiomyopathy) ([place]) 04/10/2016  . Upper airway cough syndrome 03/14/2016  . Morbid obesity ([place]) 03/14/2016  . COPD (chronic obstructive pulmonary disease) ([place]) 03/11/2016  . Thrombocytopenia ([place]) 12/12/2015  . Anemia 12/12/2015  . Vitamin D deficiency 12/12/2015  . Acute urinary retention 12/11/2015  . Osteoporosis 12/11/2015  . Hip fracture ([place]) 12/10/2015  . Aortic atherosclerosis ([place]) 12/10/2015  . Lung blebs ([place]) 12/03/2015  . Pelvic fracture ([place]) 08/19/2015  . Fall at home 08/19/2015  . Essential hypertension 08/19/2015  . Mixed hyperlipidemia 08/19/2015  . Asthma 08/19/2015  . Paroxysmal atrial fibrillation ([place]) 08/19/2015  . Hyperthyroidism 08/19/2015  . Chronic HFrEF (heart failure with reduced ejection fraction) ([place])   . Coronary artery disease   . Aortic stenosis     Conditions to be  addressed/monitored: Grief Counseling for Patient with Chronic Obstructive Pulmonary Disease, Stage III Chronic Kidney Disease, Major Depression, Chronic Pain Disorder, Grief Reaction with Prolonged Bereavement. Limited Social Support, Mental Health Concerns, and Social Isolation.  Care Plan : LCSW Plan of Care  Updates made by Francis Gaines, LCSW since 07/07/2020 12:00 AM    Problem: Reduce and Manage Symptoms of Depression, Grief and Loss. Resolved 07/07/2020  Priority: High    Goal: Reduce and Manage Symptoms of Depression, Grief and Loss. Completed 07/07/2020  Start Date: 05/26/2020  Expected End Date: 07/07/2020  This Visit's Progress: On track  Recent Progress: On track  Priority: High  Note:   Current Barriers:  . Unable to independently manage symptoms of Depression and Grief. . Knowledge deficits related to accessing mental health providers in patient with Depression and Grief. . Patient is experiencing symptoms of Depression and Grief, which seem to be exacerbated by the recent death of her sister.     . Patient needs Support, Education, and Care Coordination in order to meet unmet mental health needs. . Patient is unable to independently navigate community resource options without care coordination support. . Deceased sisters  memorial service and birthday celebration are scheduled for Friday, May 6th, which may trigger depression and grief. Clinical Social Work [employer]):  Marland Kitchen Over the next 60 days, patient will work with LCSW weekly, by telephone, to reduce and manage symptoms of Depression and Grief, until connected for ongoing counseling resources within the community.  . Patient will implement clinical interventions discussed today to decrease symptoms of Depression and Grief, and increase knowledge and/or ability of: coping skills, healthy habits, self-management skills, and stress reduction. . Over the next 60 days, patient will demonstrate improved adherence to prescribed  treatment plan for Depression and Grief, as evidenced by a reduction in symptoms.  Interventions:  . Collaboration with Danielle, [name], MD regarding development and update of comprehensive plan of care as evidenced by provider attestation and co-signature. . Assessed patient's understanding, education, previous treatment and care coordination needs.  . Patient interviewed and appropriate assessments performed: Review of PHQ 2 and PHQ 9 Depression Screening scores. . Provided basic mental health support, education and interventions.  Nash Dimmer with appropriate clinical care team members regarding patient needs, requesting Nutrition/Dietary Consult from Danielle, [name], MD. . Discussed several options for long term counseling based on need and insurance.  . Assisted patient with narrowing down options for long-term counseling services and provided list of community providers in network with [employer].   . Reviewed mental health medications with patient prescribed by PCP and discussed compliance.  . Other interventions include: Motivational Interviewing, Solution-Focused Strategies, Mindfulness Meditation Strategies, Relaxation Techniques, and Deep Breathing Exercises, Brief Cognitive Behavioral Therapy, Grief Counseling, Emotional/Supportive Counseling, Psychotropic Medication Adherence Assessment, Sleep Hygiene Assessment, Psychoeducation, Health Education, Problem Solving and Teaching/Coaching Strategies.  . Mailed patient mental health counseling resources, list of community providers, coping strategies/techniques and list of support groups. Self-Care Activities: . Self administers medications as prescribed; Attends all scheduled provider appointments; Calls pharmacy for medication refills; Attends church and other social activities; Lives independently with husband and performs ADL's/IADL's independently; Calls provider office for new questions and/or concerns; Motivated for  treatment; Strong family and social support. Patient Goals: . Continue to implement clinical interventions to help reduce and manage symptoms (I.e. Mindfulness Meditation Strategies, Relaxation Techniques, and Deep Breathing Exercises) on a daily basis. . Try to exercise at least 2 to 3 times per week, or as tolerated, eat a healthy, well-balanced diet, drink plenty of fluids and obtain adequate rest. . Continue spending time talking or visiting with friends, family members, neighbors, congregation members, etc., to gain support and socialization.   Follow Up Plan: No Follow-Up Required.      Follow Up Plan: No Follow-Up Required.      [name] LCSW Licensed Clinical Social Worker [place] [phone]

## 2020-07-07 NOTE — Patient Instructions (Signed)
Visit Information  PATIENT GOALS: Goals Addressed              This Visit's Progress   .  COMPLETED: Reduce and Manage Symptoms of Depression, Grief and Loss. (pt-stated)   On track     Timeframe:  Short-Term Goal Priority:  High Start Date:    05/26/2020                       Expected End Date:   07/07/2020                 Follow Up Date:  No Follow-Up Required.   Patient Goals: . Continue to implement clinical interventions to help reduce and manage symptoms (I.e. Mindfulness Meditation Strategies, Relaxation Techniques, and Deep Breathing Exercises) on a daily basis. . Try to exercise at least 2 to 3 times per week, or as tolerated, eat a healthy, well-balanced diet, drink plenty of fluids and obtain adequate rest. . Continue spending time talking or visiting with friends, family members, neighbors, congregation members, etc., to gain support and socialization.          Patient verbalizes understanding of instructions provided today and agrees to view in [place].   [name] LCSW Licensed Clinical Social Worker [place] [phone]

## 2020-07-10 ENCOUNTER — Encounter: Payer: Self-pay | Admitting: Family Medicine

## 2020-07-21 ENCOUNTER — Telehealth: Payer: Self-pay | Admitting: Family Medicine

## 2020-07-21 ENCOUNTER — Telehealth: Payer: Self-pay | Admitting: Hematology

## 2020-07-21 ENCOUNTER — Telehealth: Payer: Self-pay | Admitting: Pharmacist

## 2020-07-21 NOTE — Telephone Encounter (Signed)
I do not recommend neb treatments at home given her hx of atrial fib and heart disease. Albuterol inh and Symbicort 160-4.5 mcg to continue as reported last visit, prn. If symptoms are more frequent, she can start using Symbicort daily. Thanks, BJ

## 2020-07-21 NOTE — Telephone Encounter (Signed)
Patient would like a nebulizer, okay to order?

## 2020-07-21 NOTE — Chronic Care Management (AMB) (Signed)
Chronic Care Management Pharmacy Assistant   Name: Danielle [name]  MRN: [id number] DOB: 1946/02/02  Reason for Encounter: Disease State/ General Assessment Call.    Conditions to be addressed/monitored: CHF, HTN, HLD and COPD   Recent office visits:  07/07/20 [name] LCSW [facility]) - Seen for CCM services for grief counseling for COPD and other chronic conditions. No medication changes.   07/02/20 [name] LCSW (Family Medicine) - Seen for CCM services for mental health counseling and other chronic conditions. no medication changes.   07/02/20 [name] ( PCP) - seen for postmenopausal vaginal bleeding and other chronic conditions. Discontinued atorvastatin 40mg . Referral placed to gynecology. Follow up in 6 months.   06/24/20 Peter Garter RN [facility]) -  Seen for CCM services related to chronic pain disorder and other chronic conditions. No medication changes. Follow up already scheduled in chart.  06/18/20 [name] LCSW (Family Medicine) - Seen for CCM services for grief counseling and other chronic conditions. No medication changes and follow up already scheduled in chart.   Recent consult visits:  06/17/20 [name] MD ( Physical Medicine) -  Presented to clinic for spondylosis without myelopathy or radiculopathy and other issues. No medication changes. Referral for physical therapy placed. Follow up in 3 months.   Hospital visits:  None in previous 6 months  Medications: Outpatient Encounter Medications as of 07/21/2020  Medication Sig Note  . albuterol (PROVENTIL HFA;VENTOLIN HFA) 108 (90 Base) MCG/ACT inhaler Inhale 2 puffs into the lungs every 6 (six) hours as needed for wheezing or shortness of breath.    Marland Kitchen alendronate (FOSAMAX) 70 MG tablet Take 1 tablet (70 mg total) by mouth once a week. Take with a full glass of water on an empty stomach. 03/04/2020: Saturday  . ascorbic acid (VITAMIN C) 500 MG tablet Take 500  mg by mouth daily.   Marland Kitchen aspirin EC 81 MG tablet Take 81 mg daily by mouth.   . budesonide-formoterol (SYMBICORT) 160-4.5 MCG/ACT inhaler Inhale 2 puffs into the lungs daily as needed.   . carvedilol (COREG) 25 MG tablet Take 25 mg by mouth 2 (two) times daily with a meal.   . Cholecalciferol 25 MCG (1000 UT) tablet Take 1,000 Units by mouth daily.   . diclofenac sodium (VOLTAREN) 1 % GEL Apply 4 g topically 4 (four) times daily.   . diphenhydramine-acetaminophen (TYLENOL PM) 25-500 MG TABS tablet Take 2 tablets by mouth at bedtime.   . fluticasone (FLONASE) 50 MCG/ACT nasal spray Place 1 spray into both nostrils daily.   . furosemide (LASIX) 20 MG tablet Take 2 tablets (40 mg total) by mouth daily.   Marland Kitchen MELATONIN PO Take 5 mg by mouth at bedtime.   . methimazole (TAPAZOLE) 5 MG tablet Take 3 tablets (15 mg total) by mouth daily.   . pantoprazole (PROTONIX) 40 MG tablet Take 1 tablet (40 mg total) by mouth daily.   . rosuvastatin (CRESTOR) 40 MG tablet TAKE 1 TABLET BY MOUTH  DAILY   . sacubitril-valsartan (ENTRESTO) 49-51 MG Take 1 tablet by mouth 2 (two) times daily.   Marland Kitchen senna-docusate (SENOKOT-S) 8.6-50 MG tablet Take 2 tablets at bedtime by mouth.    . tamoxifen (NOLVADEX) 20 MG tablet Take 1 tablet by mouth once daily   . traMADol (ULTRAM) 50 MG tablet Take 1 tablet (50 mg total) by mouth 3 (three) times daily.   Marland Kitchen trolamine salicylate (ASPERCREME) 10 % cream Apply 1 application topically as needed for muscle  pain.   . valACYclovir (VALTREX) 500 MG tablet Take 1 tablet (500 mg total) by mouth 2 (two) times daily. For 3 days when outbreaks. (Patient taking differently: Take 500 mg by mouth 2 (two) times daily as needed. For 3 days when outbreaks.)   . warfarin (COUMADIN) 3 MG tablet TAKE AS DIRECTED BY ANTICOAGULATION CLINIC (Patient taking differently: Take 3 mg by mouth daily. TAKE AS DIRECTED BY  ANTICOAGULATION  CLINIC)    No facility-administered encounter medications on file as of  07/21/2020.   . Current COPD regimen:   Albuterol HFA inhaler, 2 puffs every six hours as needed for wheezing or shortness of breath   Symbicort 160-4.55 mcg/act inhale 2 puffs twice daily   . No flowsheet data found.   . Any recent hospitalizations or ED visits since last visit with CPP? No  . Reports COPD symptoms, including Increased shortness of breath  and Wheezing  . What recent interventions/DTPs have been made by any provider to improve breathing since last visit: None.   . Have you had exacerbation/flare-up since last visit? No  . What do you do when you are short of breath?  Rescue medication and Rest  Respiratory Devices/Equipment . Do you have a nebulizer? No . Do you use a Peak Flow Meter? No . Do you use a maintenance inhaler? Yes . How often do you forget to use your daily inhaler? No.  . Do you use a rescue inhaler? Yes . How often do you use your rescue inhaler?  1-2x per week . Do you use a spacer with your inhaler? No  Adherence Review: . Does the patient have >5 day gap between last estimated fill date for maintenance inhaler medications? No   Comprehensive medication review performed; Spoke to patient regarding cholesterol  Lipid Panel    Component Value Date/Time   CHOL 162 07/02/2020 1029   TRIG 90.0 07/02/2020 1029   HDL 73.70 07/02/2020 1029   LDLCALC 71 07/02/2020 1029    10-year ASCVD risk score: The ASCVD Risk score Mikey Bussing DC Jr., et al., 2013) failed to calculate for the following reasons:   The patient has a prior MI or stroke diagnosis  . Current antihyperlipidemic regimen:   Rosuvastatin 40mg , 1 tablet once daily   Aspirin 81mg , 1 tablet once daily  . Previous antihyperlipidemic medications tried: None.   . ASCVD risk enhancing conditions: age >2, HTN and CHF  . What recent interventions/DTPs have been made by any provider to improve Cholesterol control since last CPP Visit: None.   . Any recent hospitalizations or ED visits  since last visit with CPP? No  . What diet changes have been made to improve Cholesterol?  o Patient states she is eating food lower in cholesterol and eating more fruits and vegetables. Patient states she is notable tot do much cooking but her husband helps her a lot at home.   . What exercise is being done to improve Cholesterol?  o Patient states she's not able to do much because she is not steady on her feet. She has to use a walker. Patient can bathe herself and dress and feed herself but is not able to do housework or walking. Patient states her husband helps her a lot at home.   Adherence Review: Does the patient have >5 day gap between last estimated fill dates? No  Star Rating Drugs:   Entresto 49-51 mg -  Last filled on 12/14/19 90DS at [facility]  Rosuvastatin 40mg  - last  filled on 90DS at [facility].   Notes:  Spoke with patient and went over medications as listed in detail. Patient reports that she is taking all medications listed with no issues at this time. Patient states her COPD is fine unless she does too much and then she mostly gets wheezing. Patient states she wanted to know what to do about getting a nebulizer to use when she needs it. I advised patient to call Dr. Morrison Old office and leave a message to inquire about getting one. Patient will call and leave message. Patient is not able to do much activity now and has to use a walker due to not having good balance.   [place]  Clinical Pharmacist Assistant [phone]

## 2020-07-21 NOTE — Telephone Encounter (Signed)
Do you remember discussing this?

## 2020-07-21 NOTE — Telephone Encounter (Signed)
Pt called in requesting appt per 5/23 sch msg. Pt aware of upcoming appt date and time.

## 2020-07-21 NOTE — Telephone Encounter (Signed)
I called and spoke with patient. We went over the information below & she verbalized understanding.  

## 2020-07-21 NOTE — Telephone Encounter (Signed)
The patient called and says that Dr. [name] ordered her a breathing machine and wants an update on the status of the machine.  Please advise.

## 2020-07-23 ENCOUNTER — Encounter: Payer: Self-pay | Admitting: Internal Medicine

## 2020-07-23 ENCOUNTER — Ambulatory Visit: Payer: Medicare Other | Admitting: Internal Medicine

## 2020-07-23 ENCOUNTER — Other Ambulatory Visit: Payer: Self-pay

## 2020-07-23 VITALS — BP 110/84 | HR 79 | Ht 67.0 in | Wt 251.0 lb

## 2020-07-23 DIAGNOSIS — I428 Other cardiomyopathies: Secondary | ICD-10-CM | POA: Diagnosis not present

## 2020-07-23 DIAGNOSIS — R0609 Other forms of dyspnea: Secondary | ICD-10-CM

## 2020-07-23 DIAGNOSIS — I38 Endocarditis, valve unspecified: Secondary | ICD-10-CM

## 2020-07-23 DIAGNOSIS — I480 Paroxysmal atrial fibrillation: Secondary | ICD-10-CM | POA: Diagnosis not present

## 2020-07-23 DIAGNOSIS — R0600 Dyspnea, unspecified: Secondary | ICD-10-CM | POA: Diagnosis not present

## 2020-07-23 DIAGNOSIS — I5022 Chronic systolic (congestive) heart failure: Secondary | ICD-10-CM | POA: Diagnosis not present

## 2020-07-23 NOTE — Progress Notes (Signed)
[place]   [phone] [phone]   Clinic Follow up Note   Patient Care Team: [name], [name], MD as PCP - General (Family Medicine) End, Harrell Gave, MD as PCP - Cardiology (Cardiology) [name], MD as Consulting Physician (Hematology) Kyung Rudd, MD as Consulting Physician (Radiation Oncology) Rolm Bookbinder, MD as Consulting Physician (General Surgery) [name], Charlestine Massed, NP as Nurse Practitioner (Hematology and Oncology) Dimitri Ped, RN as Case Manager Viona Gilmore, [facility] as Pharmacist (Pharmacist)  Date of Service:  07/24/2020  CHIEF COMPLAINT: F/u of left breast cancer  SUMMARY OF ONCOLOGIC HISTORY: Oncology History Overview Note  Cancer Staging Malignant neoplasm of upper-outer quadrant of left breast in female, estrogen receptor positive ([place]) Staging form: Breast, AJCC 8th Edition - Clinical stage from 12/13/2016: Stage IB (cT2, cN0, cM0, G2, ER: Positive, PR: Positive, HER2: Negative) - Signed by [name], MD on 12/21/2016 - Pathologic stage from 01/14/2017: Stage IA (pT2, pN0, cM0, G1, ER: Positive, PR: Positive, HER2: Negative, Oncotype DX score: 4) - Signed by [name], MD on 04/17/2017     Malignant neoplasm of upper-outer quadrant of left breast in female, estrogen receptor positive ([place])  12/06/2016 Mammogram   Diagnostic Mammogram 12/06/16 IMPRESSION:  Suspicious mass in the left breast at 3 o'clock 2 cm from the nipple measuring 1.9 x 1.1 x 2.2 cm. RECOMMENDATION: Ultrasound-guided core biopsy of the mass in the 3 o'clock region of the left breast is recommended. The biopsy will be scheduled at the patient's convenience.   12/13/2016 Initial Biopsy   Diagnosis 12/13/16 Breast, left, needle core biopsy, 3:00 o'clock, 2cmfn - INVASIVE DUCTAL CARCINOMA - SEE COMMENT   12/16/2016 Initial Diagnosis   Malignant neoplasm of upper-outer quadrant of left breast in female, estrogen receptor positive  ([place])   12/17/2016 Receptors her2   Estrogen Receptor: 100%, POSITIVE, STRONG STAINING INTENSITY Progesterone Receptor: 100%, POSITIVE, STRONG STAINING INTENSITY Proliferation Marker Ki67: 30% HER2 - NEGATIVE    01/14/2017 Surgery   Left breast lumpectomy with Dr. Donne Hazel   01/14/2017 Pathology Results   Diagnosis 01/14/17 1. Breast, lumpectomy, Left - INVASIVE DUCTAL CARCINOMA, GRADE I/III, SPANNING 2.1 CM. - DUCTAL CARCINOMA IN SITU, LOW GRADE. - INVASIVE CARCINOMA IS BROADLY PRESENT AT THE INFERIOR MARGIN OF SPECIMEN 1. - DUCTAL CARCINOMA IN SITU IS FOCALLY PRESENT AT THE INFERIOR MARGIN OF SPECIMEN 1 AND BROADLY LESS THAN 0.1 CM TO THE LATERAL MARGIN OF SPECIMEN 1. - SEE ONCOLOGY TABLE BELOW. 2. Breast, excision, Additional medial margin left - DUCTAL CARCINOMA IN SITU, LOW GRADE. - DUCTAL CARCINOMA IS FOCALLY LESS THAN 0.1 CM TO THE NEW MARGIN OF SPECIMEN 2. 3. Breast, excision, Additional lateral margin left - DUCTAL CARCINOMA IN SITU, LOW GRADE. - DUCTAL CARCINOMA IN SITU IS GREATER THAN 0.2 CM TO ALL MARGINS. 4. Breast, excision, Additional superior margin left - DUCTAL CARCINOMA IN SITU, LOW GRADE. - DUCTAL CARCINOMA IN SITU IS BROADLY LESS THAN 0.1 CM TO THE NEW MARGIN OF SPECIMEN 4. 5. Lymph node, sentinel, biopsy, Left axillary - THERE IS NO EVIDENCE OF CARCINOMA IN 1 OF 1 LYMPH NODE (0/1). 6. Breast, excision, Additional inferior margin left - DUCTAL CARCINOMA IN SITU, LOW GRADE. - DUCTAL CARCINOMA IN SITU IS GREATER THAN 0.2 CM TO ALL MARGINS.    01/14/2017 Oncotype testing   Her oncotype recurrence score is 4 and her distance recurrent on Tamoxifen alone is 3%.   03/08/2017 - 04/05/2017 Radiation Therapy   RT with Dr. Lisbeth Renshaw  06/2017 -  Anti-estrogen oral therapy   Tamoxifen daily   12/15/2017 Mammogram   12/15/2017 Mammogram IMPRESSION: New lumpectomy site left breast. No mammographic evidence of malignancy in either breast.      CURRENT THERAPY:   Adjuvant Tamoxifen 20 mg daily started on 04/20/17  INTERVAL HISTORY:  Sister Carbone is here for a follow up of left breast cancer. She was last seen by me 8 months ago and seen by NP Lacie 2 months ago in interim. She presents to the clinic with her husband. She requested this appointment due to her concern of left breast pain. She felt something at left breast last Saturday along with moderate discomfort, she felt it was similar to what she felt when she was diagnosed with left breast cancer.  She did take Tylenol, and discomfort resolved.  She has multiple medical issues. She depression is better not seleeping well  She has dypnea on exertion, which is a chronic and stable.  She uses a walker at home, in a wheelchair when she goes out.  She sees cardiology for heart failure. She had vaginal bleeding once a few month ago, no recurernt , she is going to see GYN soon. No other new complains.   All other systems were reviewed with the patient and are negative.  MEDICAL HISTORY:  Past Medical History:  Diagnosis Date  . Allergy   . Anxiety   . Aortic stenosis    Status post bioprosthetic AVR  . Arthritis    DJD  . Asthma   . Breast cancer ([place]) 12/13/2016   Left breast  . Chronic systolic CHF (congestive heart failure) ([place])    EF 30% 04/2014  . COPD (chronic obstructive pulmonary disease) ([place])   . Coronary artery disease    per pt, had LHC prior to AVR in [place] that did not show any blockages; no stents/bypass  . Gastritis   . Hyperlipidemia   . Hypertension   . Hyperthyroidism   . Multiple thyroid nodules   . Osteoporosis   . Paroxysmal atrial fibrillation (H[facility])   . Pelvis fracture ([place]) 08/19/2015   MULTIPLE   . Personal history of radiation therapy 2018  . Spontaneous pneumothorax 11/28/2015   left   . Stroke ([place]) 2000   rt hand weak    SURGICAL HISTORY: Past Surgical History:  Procedure Laterality Date  . BREAST LUMPECTOMY Left 12/2016  .  BREAST LUMPECTOMY WITH RADIOACTIVE SEED AND SENTINEL LYMPH NODE BIOPSY Left 01/14/2017   Procedure: LEFT BREAST LUMPECTOMY WITH RADIOACTIVE SEED AND SENTINEL LYMPH NODE BIOPSY;  Surgeon: Rolm Bookbinder, MD;  Location: [place];  Service: General;  Laterality: Left;  . CARDIAC CATHETERIZATION    . CHEST TUBE INSERTION  10/2015  . INTRAMEDULLARY (IM) NAIL INTERTROCHANTERIC Left 12/10/2015   Procedure: INTRAMEDULLARY (IM) NAIL INTERTROCHANTRIC;  Surgeon: Leandrew Koyanagi, MD;  Location: [place];  Service: Orthopedics;  Laterality: Left;  . PLEURADESIS Left 12/03/2015   Procedure: PLEURADESIS;  Surgeon: Melrose Nakayama, MD;  Location: [place];  Service: Thoracic;  Laterality: Left;  . RESECTION OF APICAL BLEB Left 12/03/2015   Procedure: BLEBECTOMY;  Surgeon: Melrose Nakayama, MD;  Location: [place];  Service: Thoracic;  Laterality: Left;  . RIGHT/LEFT HEART CATH AND CORONARY ANGIOGRAPHY N/A 03/10/2020   Procedure: RIGHT/LEFT HEART CATH AND CORONARY ANGIOGRAPHY;  Surgeon: [name], MD;  Location: [place] [facility];  Service: Cardiovascular;  Laterality: N/A;  . THORACOSCOPY  12/03/2015  . VALVE REPLACEMENT    .  VIDEO ASSISTED THORACOSCOPY Left 12/03/2015   Procedure: VIDEO ASSISTED THORACOSCOPY;  Surgeon: Melrose Nakayama, MD;  Location: [place];  Service: Thoracic;  Laterality: Left;    I have reviewed the social history and family history with the patient and they are unchanged from previous note.  ALLERGIES:  is allergic to lisinopril, tetanus toxoid adsorbed, and other.  MEDICATIONS:  Current Outpatient Medications  Medication Sig Dispense Refill  . albuterol (PROVENTIL HFA;VENTOLIN HFA) 108 (90 Base) MCG/ACT inhaler Inhale 2 puffs into the lungs every 6 (six) hours as needed for wheezing or shortness of breath.     Marland Kitchen alendronate (FOSAMAX) 70 MG tablet Take 1 tablet (70 mg total) by mouth once a week. Take with a full glass of water on an empty stomach. 13 tablet 3  . ascorbic acid  (VITAMIN C) 500 MG tablet Take 500 mg by mouth daily.    Marland Kitchen aspirin EC 81 MG tablet Take 81 mg daily by mouth.    . budesonide-formoterol (SYMBICORT) 160-4.5 MCG/ACT inhaler Inhale 2 puffs into the lungs daily as needed.    . carvedilol (COREG) 25 MG tablet Take 25 mg by mouth 2 (two) times daily with a meal.    . Cholecalciferol 25 MCG (1000 UT) tablet Take 1,000 Units by mouth daily.    . diclofenac sodium (VOLTAREN) 1 % GEL Apply 4 g topically 4 (four) times daily. 500 g 3  . diphenhydramine-acetaminophen (TYLENOL PM) 25-500 MG TABS tablet Take 2 tablets by mouth at bedtime.    . fluticasone (FLONASE) 50 MCG/ACT nasal spray Place 1 spray into both nostrils daily. 16 g 3  . furosemide (LASIX) 20 MG tablet Take 2 tablets (40 mg total) by mouth daily. (Patient taking differently: Take 20 mg by mouth daily.) 60 tablet 1  . MELATONIN PO Take 5 mg by mouth at bedtime.    . methimazole (TAPAZOLE) 5 MG tablet Take 3 tablets (15 mg total) by mouth daily. 270 tablet 2  . pantoprazole (PROTONIX) 40 MG tablet Take 1 tablet (40 mg total) by mouth daily. 30 tablet 0  . rosuvastatin (CRESTOR) 40 MG tablet TAKE 1 TABLET BY MOUTH  DAILY 90 tablet 3  . sacubitril-valsartan (ENTRESTO) 49-51 MG Take 1 tablet by mouth 2 (two) times daily.    Marland Kitchen senna-docusate (SENOKOT-S) 8.6-50 MG tablet Take 2 tablets at bedtime by mouth.     . tamoxifen (NOLVADEX) 20 MG tablet Take 1 tablet (20 mg total) by mouth daily. 90 tablet 3  . traMADol (ULTRAM) 50 MG tablet Take 1 tablet (50 mg total) by mouth 3 (three) times daily. 90 tablet 5  . trolamine salicylate (ASPERCREME) 10 % cream Apply 1 application topically as needed for muscle pain.    . valACYclovir (VALTREX) 500 MG tablet Take 500 mg by mouth 2 (two) times daily as needed.    . warfarin (COUMADIN) 3 MG tablet Take 3 mg by mouth as directed.     No current facility-administered medications for this visit.    PHYSICAL EXAMINATION: ECOG PERFORMANCE STATUS: 3 -  Symptomatic, >50% confined to bed  Vitals:   07/24/20 1117  BP: 123/71  Pulse: 79  Resp: 19  Temp: 97.9 F (36.6 C)  SpO2: 100%   Filed Weights   07/24/20 1117  Weight: 250 lb (113.4 kg)    GENERAL:alert, no distress and comfortable SKIN: skin color, texture, turgor are normal, no rashes or significant lesions EYES: normal, Conjunctiva are pink and non-injected, sclera clear NECK: supple, thyroid  normal size, non-tender, without nodularity LYMPH:  no palpable lymphadenopathy in the cervical, axillary  LUNGS: clear to auscultation and percussion with normal breathing effort HEART: regular rate & rhythm and no murmurs and no lower extremity edema ABDOMEN:abdomen soft, non-tender and normal bowel sounds Musculoskeletal:no cyanosis of digits and no clubbing  NEURO: alert & oriented x 3 with fluent speech, no focal motor/sensory deficits Breasts: Breast inspection showed them to be symmetrical with no nipple discharge. Palpation of the breasts and axilla revealed no obvious mass that I could appreciate.  Surgical incision in the left breast has healed well with minimal scar tissue.   LABORATORY DATA:  I have reviewed the data as listed CBC Latest Ref Rng & Units 07/24/2020 05/21/2020 03/10/2020  WBC 4.0 - 10.5 K/uL 5.1 5.3 -  Hemoglobin 12.0 - 15.0 g/dL 11.1(L) 12.4 11.6(L)  Hematocrit 36.0 - 46.0 % 34.1(L) 38.2 34.0(L)  Platelets 150 - 400 K/uL 86(L) 95(L) -     CMP Latest Ref Rng & Units 07/24/2020 05/21/2020 03/19/2020  Glucose 70 - 99 mg/dL 110(H) 97 97  BUN 8 - 23 mg/dL '14 13 13  ' Creatinine 0.44 - 1.00 mg/dL 1.21(H) 1.31(H) 1.10(H)  Sodium 135 - 145 mmol/L 142 143 137  Potassium 3.5 - 5.1 mmol/L 4.9 4.9 4.7  Chloride 98 - 111 mmol/L 110 110 107  CO2 22 - 32 mmol/L '25 24 22  ' Calcium 8.9 - 10.3 mg/dL 9.5 9.9 9.8  Total Protein 6.5 - 8.1 g/dL 6.7 7.5 -  Total Bilirubin 0.3 - 1.2 mg/dL 0.3 0.5 -  Alkaline Phos 38 - 126 U/L 47 52 -  AST 15 - 41 U/L 9(L) 13(L) -  ALT 0 - 44  U/L <6 11 -      RADIOGRAPHIC STUDIES: I have personally reviewed the radiological images as listed and agreed with the findings in the report. No results found.   ASSESSMENT & PLAN:  Danielle [name] is a 75 y.o. female with    1. Malignant neoplasm of upper-outer quadrant of left breast, Stage IB, pT2N0M0, ER/PR: POSITIVE, HER2: NEGATIVE, Grade II -She was diagnosed in 11/2016. She is s/p left breastlumpectomyand adjuvant radiation.  -Based on Oncotype Dx she has low risk based on the recurrence score,chemo was not recommended -She started Tamoxifen in 06/2017. Tolerating well, will continue for 5 to 10 years.Due to her severe arthritis, she may not be able to tolerate aromatase inhibitor. -Given mild drug interaction between coumadin and Tamoxifen she will continue f/u with PCP for PT/INR check monthly. -She was concerned about mild left breast discomfort and she might feel something in the breast, however breast exam was unremarkable today, I reassured her. -Lab reviewed, her creatinine and mild thrombocytopenia are stable, -We will follow-up in Sep as scheduled.   2. Recurrent Left breast and chest wall cellulitisin 2020, resolved  3. Osteoporosis, Arthritiswith back pain -Underwent Left Hip surgery with rod placement in 2017, ambulates with walker -Her 10/2018 DEXA shows osteoporosis with lowest T-score -2.8. Repeat in 10/2020.  -On Fosamax -Arthritis with chronic back pain. She will continues to f/u with pain specialist for possible injections.  4. CAD, Afib, CHF, Aortic Stenosis, H/o Stroke -Continue medications and f/u with PCP and cardiologist.   5. Mild Thrombocytopenia,likelyITP  -Per pt she did have Platelet transfusion with her prior heart surgery.  -At this mild level, there is no indication for treatment. Will monitor.she is fine to continue Coumadin.  -Moderate and stable.    PLAN: -She is clinically stable,  exam unremarkable  today  -Continue Tamoxifen   -Mammogramdue in 12/2020 -next f/u in Sep 2022 as scheduled     No problem-specific Assessment & Plan notes found for this encounter.   No orders of the defined types were placed in this encounter.  All questions were answered. The patient knows to call the clinic with any problems, questions or concerns. No barriers to learning was detected. The total time spent in the appointment was 25 minutes.     [name], MD 07/24/2020   I, Joslyn Devon, am acting as scribe for [name], MD.   I have reviewed the above documentation for accuracy and completeness, and I agree with the above.

## 2020-07-23 NOTE — Progress Notes (Signed)
Follow-up Outpatient Visit Date: 07/23/2020  Primary Care Provider: [name], [name], MD [address] [place] [address]  Chief Complaint: Shortness of breath and fatigue  HPI:  Ms. Engen is a 75 y.o. female with history of aortic valve disease (severe regurgitation by her description) s/p bioprosthetic aortic valve replacement in [place] in 03/2014, NICM with LVEF as low as 30-35% by report in 03/2015, paroxysmal atrial fibrillation on chronic warfarin, stroke x 2, hyperlipidemia, hypertension, hyperthyroidism,left breast cancer,spontaneous pneumothorax, and left hip fracture, who presents for follow-up of heart failure and valvular heart disease.  I last saw her 2 months ago, at which time [name] continued to have class III heart failure with chronic leg edema and exertional dyspnea with minimal activity.  She had decreased furosemide from 40 mg daily to 20 mg daily on her own as she was concerned that she was drying out too much.  We agreed to work on weight loss and exercise to help improve functional capacity.  Today, Ms. Finnicum reports that she feels about the same as at our last visit.  She still has shortness of breath with mild activity such as walking around her home.  She has been trying to do some laps in her house but is unable to walk much more than that.  She has not had any chest pain or palpitations.  Lower extremity edema is unchanged.  She is concerned about some tingling in the left breast reminiscent of what she felt leading up to her breast cancer diagnosis.  She is scheduled for follow-up in the breast center tomorrow.  She notes occasional transient palpitations without associated symptoms.  She is planning to restart physical therapy next month.  Despite some dietary changes and trying to increase her activity, she has not been able to lose any  weight.  --------------------------------------------------------------------------------------------------  Cardiovascular History & Procedures: Cardiovascular Problems:  Aortic valve disease status post bioprosthetic AVR in 03/2014  Non-ischemic cardiomyopathy  Paroxysmal atrial fibrillation  Stroke  Risk Factors:  Hypertension, hyperlipidemia, stroke, and age > 35  Cath/PCI:  R/LHC (03/10/2020): No angiographically significant coronary artery disease. Mildly elevated left heart, right heart, and PA pressures. Normal Fick cardiac output/index. No significant AVR gradient.  R/LHC (02/12/2014, [place]): Moderate proximal LAD calcification with mild, nonobstructive disease. Mildly reduced LVEF with severe apical hypokinesis. Normal pulmonary artery pressure.  CV Surgery:  Bioprosthetic aortic valve replacement (03/12/14, [place], [place], [place])  EP Procedures and Devices:  None  Non-Invasive Evaluation(s):  TTE (11/21/2019): Normal LV size with mild LVH. LVEF 45-50% with global hypokinesis. Normal RV size and function. Bioprosthetic aortic valve present with appropriate function (mean gradient 10 mmHg).  TTE (01/18/2019): Normal LV size with LVEF 35-40%. Mild LVH. Grade 1 diastolic dysfunction. Is in function. Bioprosthetic aortic valve with normal function. Normal PA pressure.  TTE (04/21/16): Normal obese size with moderate LVH. LVEF 35-40% with mid and apical anteroseptal hypokinesis. Grade 3 diastolic dysfunction noted. Aortic valve bioprosthesis present with a mean gradient of 11 mmHg. Mitral annular calcification noted. Normal RV size and function. Mild right atrial enlargement.  TTE (03/27/15, OSH): Mild LVH with LVEF 30-35%, mild left atrial enlargement, AVR in place without regurgitation.  TTE (05/10/14, OSH): LVEF 45-50%.   Recent CV Pertinent Labs: Lab Results  Component Value Date   CHOL 162 07/02/2020   HDL 73.70 07/02/2020    LDLCALC 71 07/02/2020   TRIG 90.0 07/02/2020   CHOLHDL 2 07/02/2020   INR 3.4 (A) 07/07/2020  INR 1.1 05/23/2020   BNP 93.2 12/21/2019   K 4.9 07/24/2020   K 4.3 12/22/2016   MG 1.9 12/22/2019   BUN 14 07/24/2020   BUN 11 02/27/2020   BUN 19.5 12/22/2016   CREATININE 1.21 (H) 07/24/2020   CREATININE 1.09 (H) 12/31/2019   CREATININE 1.2 (H) 12/22/2016    Past medical and surgical history were reviewed and updated in EPIC.  Current Meds  Medication Sig  . albuterol (PROVENTIL HFA;VENTOLIN HFA) 108 (90 Base) MCG/ACT inhaler Inhale 2 puffs into the lungs every 6 (six) hours as needed for wheezing or shortness of breath.   Marland Kitchen alendronate (FOSAMAX) 70 MG tablet Take 1 tablet (70 mg total) by mouth once a week. Take with a full glass of water on an empty stomach.  Marland Kitchen ascorbic acid (VITAMIN C) 500 MG tablet Take 500 mg by mouth daily.  Marland Kitchen aspirin EC 81 MG tablet Take 81 mg daily by mouth.  . budesonide-formoterol (SYMBICORT) 160-4.5 MCG/ACT inhaler Inhale 2 puffs into the lungs daily as needed.  . carvedilol (COREG) 25 MG tablet Take 25 mg by mouth 2 (two) times daily with a meal.  . Cholecalciferol 25 MCG (1000 UT) tablet Take 1,000 Units by mouth daily.  . diclofenac sodium (VOLTAREN) 1 % GEL Apply 4 g topically 4 (four) times daily.  . diphenhydramine-acetaminophen (TYLENOL PM) 25-500 MG TABS tablet Take 2 tablets by mouth at bedtime.  . fluticasone (FLONASE) 50 MCG/ACT nasal spray Place 1 spray into both nostrils daily.  . furosemide (LASIX) 20 MG tablet Take 2 tablets (40 mg total) by mouth daily. (Patient taking differently: Take 20 mg by mouth daily.)  . MELATONIN PO Take 5 mg by mouth at bedtime.  . methimazole (TAPAZOLE) 5 MG tablet Take 3 tablets (15 mg total) by mouth daily.  . pantoprazole (PROTONIX) 40 MG tablet Take 1 tablet (40 mg total) by mouth daily.  . rosuvastatin (CRESTOR) 40 MG tablet TAKE 1 TABLET BY MOUTH  DAILY  . sacubitril-valsartan (ENTRESTO) 49-51 MG Take 1  tablet by mouth 2 (two) times daily.  Marland Kitchen senna-docusate (SENOKOT-S) 8.6-50 MG tablet Take 2 tablets at bedtime by mouth.   . traMADol (ULTRAM) 50 MG tablet Take 1 tablet (50 mg total) by mouth 3 (three) times daily.  Marland Kitchen trolamine salicylate (ASPERCREME) 10 % cream Apply 1 application topically as needed for muscle pain.  . valACYclovir (VALTREX) 500 MG tablet Take 500 mg by mouth 2 (two) times daily as needed.  . warfarin (COUMADIN) 3 MG tablet Take 3 mg by mouth as directed.  . [DISCONTINUED] tamoxifen (NOLVADEX) 20 MG tablet Take 1 tablet by mouth once daily    Allergies: Lisinopril, Tetanus toxoid adsorbed, and Other  Social History   Tobacco Use  . Smoking status: Former Smoker    Packs/day: 0.25    Years: 10.00    Pack years: 2.50    Types: Cigarettes    Quit date: 03/01/2010    Years since quitting: 10.4  . Smokeless tobacco: Never Used  Vaping Use  . Vaping Use: Never used  Substance Use Topics  . Alcohol use: No  . Drug use: No    Family History  Problem Relation Age of Onset  . Diabetes Mother   . Heart attack Mother 53  . Diabetes Father   . Lung cancer Father   . Diabetes Sister   . Thyroid disease Sister   . Diabetes Sister   . HIV Brother     Review of  Systems: A 12-system review of systems was performed and was negative except as noted in the HPI.  --------------------------------------------------------------------------------------------------  Physical Exam: BP 110/84 (BP Location: Right Arm, Patient Position: Sitting, Cuff Size: Large)   Pulse 79   Ht 5\' 7"  (1.702 m)   Wt 251 lb (113.9 kg)   SpO2 96%   BMI 39.31 kg/m   General:  NAD. Neck: No JVD or HJR. Lungs: Mildly diminished breath sounds throughout without wheezes or crackles. Heart: Regular rate and rhythm with 1/6 systolic murmur.  No rubs or gallops. Abdomen: Soft, nontender, nondistended. Extremities: Trace pretibial edema bilaterally.  EKG: Normal sinus rhythm with first-degree AV  block, isolated PVC, inferior infarct, and poor R wave progression.  Compared with prior tracing from 05/23/2020, PR interval has lengthened and PVC is now present.  Otherwise, no significant interval change.  Lab Results  Component Value Date   WBC 5.1 07/24/2020   HGB 11.1 (L) 07/24/2020   HCT 34.1 (L) 07/24/2020   MCV 96.6 07/24/2020   PLT 86 (L) 07/24/2020    Lab Results  Component Value Date   NA 142 07/24/2020   K 4.9 07/24/2020   CL 110 07/24/2020   CO2 25 07/24/2020   BUN 14 07/24/2020   CREATININE 1.21 (H) 07/24/2020   GLUCOSE 110 (H) 07/24/2020   ALT <6 07/24/2020    Lab Results  Component Value Date   CHOL 162 07/02/2020   HDL 73.70 07/02/2020   LDLCALC 71 07/02/2020   TRIG 90.0 07/02/2020   CHOLHDL 2 07/02/2020    --------------------------------------------------------------------------------------------------  ASSESSMENT AND PLAN: Chronic HFrEF due to nonischemic cardiomyopathy and valvular heart disease sp AVR: Ms. Georg Ruddle continues to have significant functional limitations consistent with NYHA class III heart failure.  Deconditioning and morbid obesity are likely contributing.  Underlying pulmonary disease is also a possibility.  She would like to move forward with advanced heart failure evaluation; I would refer her to our advanced heart failure clinic in [place].  We will defer medication changes today.  We will also arrange for full PFTs with DLCO and bronchodilator challenge.  Paroxysmal atrial fibrillation and chronic anticoagulation: Ms. Whisnant is maintaining sinus rhythm today.  Continue anticoagulation with warfarin through her PCP as well as carvedilol 25 mg twice daily.  We will need to keep an eye on her PR interval which is slightly prolonged today.  Morbid obesity: BMI remains greater than 35 with multiple comorbidities.  Weight loss encouraged through diet and exercise.  Hopefully, her activity will increase as she  resumes physical therapy next month.  Follow-up: Return to clinic in 3 months.  [name], MD 07/25/2020 2:58 PM

## 2020-07-23 NOTE — Patient Instructions (Addendum)
Medication Instructions:  Your physician recommends that you continue on your current medications as directed. Please refer to the Current Medication list given to you today.  *If you need a refill on your cardiac medications before your next appointment, please call your pharmacy*   Lab Work: None ordered  If you have labs (blood work) drawn today and your tests are completely normal, you will receive your results only by: Marland Kitchen MyChart Message (if you have MyChart) OR . A paper copy in the mail If you have any lab test that is abnormal or we need to change your treatment, we will call you to review the results.   Testing/Procedures:  Your physician has recommended that you have a pulmonary function test. Pulmonary Function Tests are a group of tests that measure how well air moves in and out of your lungs, this will be done at [facility] in [place], they will call you to schedule this.   Follow-Up: You have been referred to the Advanced Heart Failure Clinic in [facility] July 8th with Dr Aundra Dubin. They are located inside [place], Entrance C there is free Gate parking.  At [facility], you and your health needs are our priority.  As part of our continuing mission to provide you with exceptional heart care, we have created designated Provider Care Teams.  These Care Teams include your primary Cardiologist (physician) and Advanced Practice Providers (APPs -  Physician Assistants and Nurse Practitioners) who all work together to provide you with the care you need, when you need it.  We recommend signing up for the patient portal called "MyChart".  Sign up information is provided on this After Visit Summary.  MyChart is used to connect with patients for Virtual Visits (Telemedicine).  Patients are able to view lab/test results, encounter notes, upcoming appointments, etc.  Non-urgent messages can be sent to your provider as well.   To  learn more about what you can do with MyChart, go to [email or web address].    Your next appointment:   3 month(s)  The format for your next appointment:   In Person  Provider:   You may see [name], MD or one of the following Advanced Practice Providers on your designated Care Team:    Murray Hodgkins, NP  Christell Faith, PA-C  Marrianne Mood, PA-C  Cadence Gardena, [place]  Laurann Montana, NP    Other Instructions

## 2020-07-24 ENCOUNTER — Encounter: Payer: Self-pay | Admitting: Hematology

## 2020-07-24 ENCOUNTER — Inpatient Hospital Stay: Payer: Medicare Other | Attending: Nurse Practitioner | Admitting: Hematology

## 2020-07-24 ENCOUNTER — Inpatient Hospital Stay: Payer: Medicare Other

## 2020-07-24 VITALS — BP 123/71 | HR 79 | Temp 97.9°F | Resp 19 | Ht 67.0 in | Wt 250.0 lb

## 2020-07-24 DIAGNOSIS — Z8673 Personal history of transient ischemic attack (TIA), and cerebral infarction without residual deficits: Secondary | ICD-10-CM | POA: Insufficient documentation

## 2020-07-24 DIAGNOSIS — E042 Nontoxic multinodular goiter: Secondary | ICD-10-CM | POA: Insufficient documentation

## 2020-07-24 DIAGNOSIS — D696 Thrombocytopenia, unspecified: Secondary | ICD-10-CM | POA: Insufficient documentation

## 2020-07-24 DIAGNOSIS — Z7982 Long term (current) use of aspirin: Secondary | ICD-10-CM | POA: Insufficient documentation

## 2020-07-24 DIAGNOSIS — I110 Hypertensive heart disease with heart failure: Secondary | ICD-10-CM | POA: Diagnosis not present

## 2020-07-24 DIAGNOSIS — J449 Chronic obstructive pulmonary disease, unspecified: Secondary | ICD-10-CM | POA: Insufficient documentation

## 2020-07-24 DIAGNOSIS — G4700 Insomnia, unspecified: Secondary | ICD-10-CM | POA: Insufficient documentation

## 2020-07-24 DIAGNOSIS — Z923 Personal history of irradiation: Secondary | ICD-10-CM | POA: Diagnosis not present

## 2020-07-24 DIAGNOSIS — I2510 Atherosclerotic heart disease of native coronary artery without angina pectoris: Secondary | ICD-10-CM | POA: Insufficient documentation

## 2020-07-24 DIAGNOSIS — E785 Hyperlipidemia, unspecified: Secondary | ICD-10-CM | POA: Insufficient documentation

## 2020-07-24 DIAGNOSIS — I480 Paroxysmal atrial fibrillation: Secondary | ICD-10-CM | POA: Insufficient documentation

## 2020-07-24 DIAGNOSIS — M129 Arthropathy, unspecified: Secondary | ICD-10-CM | POA: Insufficient documentation

## 2020-07-24 DIAGNOSIS — Z170 Estrogen receptor positive status [ER+]: Secondary | ICD-10-CM

## 2020-07-24 DIAGNOSIS — Z79810 Long term (current) use of selective estrogen receptor modulators (SERMs): Secondary | ICD-10-CM | POA: Diagnosis not present

## 2020-07-24 DIAGNOSIS — F32A Depression, unspecified: Secondary | ICD-10-CM | POA: Diagnosis not present

## 2020-07-24 DIAGNOSIS — I350 Nonrheumatic aortic (valve) stenosis: Secondary | ICD-10-CM | POA: Insufficient documentation

## 2020-07-24 DIAGNOSIS — M810 Age-related osteoporosis without current pathological fracture: Secondary | ICD-10-CM | POA: Diagnosis not present

## 2020-07-24 DIAGNOSIS — Z79899 Other long term (current) drug therapy: Secondary | ICD-10-CM | POA: Insufficient documentation

## 2020-07-24 DIAGNOSIS — C50412 Malignant neoplasm of upper-outer quadrant of left female breast: Secondary | ICD-10-CM

## 2020-07-24 LAB — CBC WITH DIFFERENTIAL (CANCER CENTER ONLY)
Abs Immature Granulocytes: 0.01 10*3/uL (ref 0.00–0.07)
Basophils Absolute: 0 10*3/uL (ref 0.0–0.1)
Basophils Relative: 0 %
Eosinophils Absolute: 0.5 10*3/uL (ref 0.0–0.5)
Eosinophils Relative: 9 %
HCT: 34.1 % — ABNORMAL LOW (ref 36.0–46.0)
Hemoglobin: 11.1 g/dL — ABNORMAL LOW (ref 12.0–15.0)
Immature Granulocytes: 0 %
Lymphocytes Relative: 26 %
Lymphs Abs: 1.4 10*3/uL (ref 0.7–4.0)
MCH: 31.4 pg (ref 26.0–34.0)
MCHC: 32.6 g/dL (ref 30.0–36.0)
MCV: 96.6 fL (ref 80.0–100.0)
Monocytes Absolute: 0.5 10*3/uL (ref 0.1–1.0)
Monocytes Relative: 9 %
Neutro Abs: 2.8 10*3/uL (ref 1.7–7.7)
Neutrophils Relative %: 56 %
Platelet Count: 86 10*3/uL — ABNORMAL LOW (ref 150–400)
RBC: 3.53 MIL/uL — ABNORMAL LOW (ref 3.87–5.11)
RDW: 14.7 % (ref 11.5–15.5)
WBC Count: 5.1 10*3/uL (ref 4.0–10.5)
nRBC: 0 % (ref 0.0–0.2)

## 2020-07-24 LAB — CMP (CANCER CENTER ONLY)
ALT: <6 U/L (ref 0–44)
AST: 9 U/L — ABNORMAL LOW (ref 15–41)
Albumin: 3.3 g/dL — ABNORMAL LOW (ref 3.5–5.0)
Alkaline Phosphatase: 47 U/L (ref 38–126)
Anion gap: 7 (ref 5–15)
BUN: 14 mg/dL (ref 8–23)
CO2: 25 mmol/L (ref 22–32)
Calcium: 9.5 mg/dL (ref 8.9–10.3)
Chloride: 110 mmol/L (ref 98–111)
Creatinine: 1.21 mg/dL — ABNORMAL HIGH (ref 0.44–1.00)
GFR, Estimated: 47 mL/min — ABNORMAL LOW (ref >60)
Glucose, Bld: 110 mg/dL — ABNORMAL HIGH (ref 70–99)
Potassium: 4.9 mmol/L (ref 3.5–5.1)
Sodium: 142 mmol/L (ref 135–145)
Total Bilirubin: 0.3 mg/dL (ref 0.3–1.2)
Total Protein: 6.7 g/dL (ref 6.5–8.1)

## 2020-07-24 MED ORDER — TAMOXIFEN CITRATE 20 MG PO TABS: 20.0000 mg | ORAL_TABLET | Freq: Every day | ORAL | 3 refills | Status: DC

## 2020-07-25 ENCOUNTER — Telehealth: Payer: Medicare Other

## 2020-07-25 ENCOUNTER — Encounter: Payer: Self-pay | Admitting: Internal Medicine

## 2020-07-25 ENCOUNTER — Telehealth: Payer: Self-pay

## 2020-07-25 NOTE — Telephone Encounter (Signed)
  Care Management   Follow Up Note   07/25/2020 Name: Danielle [name] MRN: [id number] DOB: 01/26/1946   Referred by: [name], [name], MD Reason for referral : Chronic Care Management (RNCM: Follow up Outreach Chronic Care Management and coordination needs-unsuccessful outreach)   An unsuccessful telephone outreach was attempted today. The patient was referred to the case management team for assistance with care management and care coordination.   Follow Up Plan: A HIPPA compliant phone message was left for the patient providing contact information and requesting a return call.   The care management team will reach out to the patient again over the next 30 days. Peter Garter RN, Jackquline Denmark, CDE Care Management Coordinator [facility] [phone], Mobile [phone]

## 2020-07-31 ENCOUNTER — Ambulatory Visit: Payer: Medicare Other | Admitting: Neurology

## 2020-07-31 ENCOUNTER — Encounter: Payer: Self-pay | Admitting: Neurology

## 2020-07-31 VITALS — BP 128/84 | HR 84 | Ht 67.0 in | Wt 250.0 lb

## 2020-07-31 DIAGNOSIS — R7989 Other specified abnormal findings of blood chemistry: Secondary | ICD-10-CM | POA: Diagnosis not present

## 2020-07-31 DIAGNOSIS — R202 Paresthesia of skin: Secondary | ICD-10-CM | POA: Diagnosis not present

## 2020-07-31 DIAGNOSIS — M5441 Lumbago with sciatica, right side: Secondary | ICD-10-CM

## 2020-07-31 DIAGNOSIS — R799 Abnormal finding of blood chemistry, unspecified: Secondary | ICD-10-CM | POA: Diagnosis not present

## 2020-07-31 DIAGNOSIS — R748 Abnormal levels of other serum enzymes: Secondary | ICD-10-CM | POA: Diagnosis not present

## 2020-07-31 DIAGNOSIS — R700 Elevated erythrocyte sedimentation rate: Secondary | ICD-10-CM | POA: Diagnosis not present

## 2020-07-31 DIAGNOSIS — R269 Unspecified abnormalities of gait and mobility: Secondary | ICD-10-CM | POA: Insufficient documentation

## 2020-07-31 DIAGNOSIS — M5442 Lumbago with sciatica, left side: Secondary | ICD-10-CM

## 2020-07-31 DIAGNOSIS — R7309 Other abnormal glucose: Secondary | ICD-10-CM | POA: Diagnosis not present

## 2020-07-31 MED ORDER — DULOXETINE HCL 60 MG PO CPEP: 60.0000 mg | ORAL_CAPSULE | Freq: Every day | ORAL | 12 refills | Status: DC

## 2020-07-31 NOTE — Progress Notes (Signed)
Chief Complaint  Patient presents with  . New Patient (Initial Visit)    She is here with her husband, [name]. Reports numbness in her bilateral legs, worse in thighs. Symptoms are more problematic at night. Gabapentin was not helpful in the past.       ASSESSMENT AND PLAN  Elias Bordner is a 75 y.o. female   Gait abnormality Chronic low back pain, radiating pain to bilateral lower extremity  Her gait difficulties are multifactorial, this include obesity, low back pain, multiple joints pain,  Differentiation diagnosis include lumbar radiculopathy, previous MRI lumbar spine showed multilevel degenerative disc disease, but there was no evidence of significant canal, foraminal stenosis,  EMG nerve conduction study  Laboratory evaluations including CPK, ESR C-reactive protein to rule out intrinsic muscle disease, systemic inflammatory process  Add on Cymbalta 60 mg daily  Primary care arranged outpatient physical therapy, she would benefit water aerobic exercise   DIAGNOSTIC DATA (LABS, IMAGING, TESTING) - I reviewed patient records, labs, notes, testing and imaging myself where available.  Laboratory evaluation May 2022: Normal TSH, lipid panel, vitamin D, INR, CMP showed mild elevated creatinine 1.2, with mildly decreased albumin 3.3, CBC showed mild anemia hemoglobin of 11.1, decreased platelet count of 86,  MRI of lumbar spine on April 06, 2017: 1. L4-5 advanced disc degeneration with disc collapse and ridging causing moderate left foraminal stenosis. 2. L5-S1 shallow left paracentral disc protrusion with noncompressive mass effect on the descending left S1 nerve root. 3. 3 cm left ovarian cyst, size stable from 2017 pelvis CT.  HISTORICAL  Danielle [name] is a 75 year old female, seen in request by her nurse practitioner   Alla Feeling, NP And primary care physician Dr., [name], [name], for evaluation of bilateral lower extremity numbness,  gait abnormality, initial evaluation was on July 31, 2020  I reviewed and summarized the referring note.Past medical history Hyperlipidemia Hypertension Left breast cancer, status post lobectomy, followed by chemo and radiation therapy Congestive heart failure Coronary artery disease Hypothyroidism Aortic valve replacement for aortic stenosis,   She had a past medical history of obesity, multiple degenerative joint disease, began to have gradual onset gait abnormality more than 5 years ago, then fell broke her left hip in October 2017, required surgery, since then, she had a worsening gait abnormality, initially able to walk rely on her walker,  Around 2020, she noticed worsening low back pain, left hip pain, also bilateral anterior thigh numbness, burning pain, to the point of difficulty supporting herself even with the walker, now she needed assistance to get up from seated position, only able to take few steps, she denies bowel and bladder incontinence, has chronic neck pain, denies significant upper extremity paresthesia or weakness    PHYSICAL EXAM:   Vitals:   07/31/20 0954  BP: 128/84  Pulse: 84  Weight: 250 lb (113.4 kg)  Height: '5\' 7"'  (1.702 m)   Not recorded     Body mass index is 39.16 kg/m.  PHYSICAL EXAMNIATION:  Gen: NAD, conversant, well nourised, well groomed                     Cardiovascular: Regular rate rhythm, no peripheral edema, warm, nontender. Eyes: Conjunctivae clear without exudates or hemorrhage Neck: Supple, no carotid bruits. Pulmonary: Clear to auscultation bilaterally   NEUROLOGICAL EXAM:  MENTAL STATUS: Morbidly obese, came in wheelchair, Speech:    Speech is normal; fluent and spontaneous with normal comprehension.  Cognition:     Orientation  to time, place and person     Normal recent and remote memory     Normal Attention span and concentration     Normal Language, naming, repeating,spontaneous speech     Fund of knowledge    CRANIAL NERVES: CN II: Visual fields are full to confrontation. Pupils are round equal and briskly reactive to light. CN III, IV, VI: extraocular movement are normal. No ptosis. CN V: Facial sensation is intact to light touch CN VII: Face is symmetric with normal eye closure  CN VIII: Hearing is normal to causal conversation. CN IX, X: Phonation is normal. CN XI: Head turning and shoulder shrug are intact  MOTOR: Right upper extremity motor strength is limited by pain, felt there was no significant bilateral upper and lower extremity proximal and distal muscle weakness, but significant bilateral knee pain, especially with deep palpitation.  REFLEXES: Reflexes are 2+ and symmetric at the biceps, triceps, knees, and trace at ankles. Plantar responses are flexor.  SENSORY: Intact to light touch, pinprick and vibratory sensation are intact in fingers and toes.  COORDINATION: There is no trunk or limb dysmetria noted.  GAIT/STANCE: Need assistant to get up from seated position, limited by her big body habitus, bilateral hip, knee pain, rely on walker, unsteady, wide-based  REVIEW OF SYSTEMS:  Full 14 system review of systems performed and notable only for as above All other review of systems were negative.   ALLERGIES: Allergies  Allergen Reactions  . Lisinopril Cough  . Tetanus Toxoid Adsorbed Swelling    Arm swelling  . Other Cough    PEANUTS    HOME MEDICATIONS: Current Outpatient Medications  Medication Sig Dispense Refill  . albuterol (PROVENTIL HFA;VENTOLIN HFA) 108 (90 Base) MCG/ACT inhaler Inhale 2 puffs into the lungs every 6 (six) hours as needed for wheezing or shortness of breath.     Marland Kitchen alendronate (FOSAMAX) 70 MG tablet Take 1 tablet (70 mg total) by mouth once a week. Take with a full glass of water on an empty stomach. 13 tablet 3  . ascorbic acid (VITAMIN C) 500 MG tablet Take 500 mg by mouth daily.    Marland Kitchen aspirin EC 81 MG tablet Take 81 mg daily by mouth.     . budesonide-formoterol (SYMBICORT) 160-4.5 MCG/ACT inhaler Inhale 2 puffs into the lungs daily as needed.    . carvedilol (COREG) 25 MG tablet Take 25 mg by mouth 2 (two) times daily with a meal.    . Cholecalciferol 25 MCG (1000 UT) tablet Take 1,000 Units by mouth daily.    . diclofenac sodium (VOLTAREN) 1 % GEL Apply 4 g topically 4 (four) times daily. 500 g 3  . diphenhydramine-acetaminophen (TYLENOL PM) 25-500 MG TABS tablet Take 2 tablets by mouth at bedtime.    . fluticasone (FLONASE) 50 MCG/ACT nasal spray Place 1 spray into both nostrils daily. 16 g 3  . furosemide (LASIX) 20 MG tablet Take 2 tablets (40 mg total) by mouth daily. (Patient taking differently: Take 20 mg by mouth daily.) 60 tablet 1  . MELATONIN PO Take 5 mg by mouth at bedtime.    . methimazole (TAPAZOLE) 5 MG tablet Take 3 tablets (15 mg total) by mouth daily. 270 tablet 2  . pantoprazole (PROTONIX) 40 MG tablet Take 1 tablet (40 mg total) by mouth daily. 30 tablet 0  . rosuvastatin (CRESTOR) 40 MG tablet TAKE 1 TABLET BY MOUTH  DAILY 90 tablet 3  . sacubitril-valsartan (ENTRESTO) 49-51 MG Take 1 tablet by  mouth 2 (two) times daily.    Marland Kitchen senna-docusate (SENOKOT-S) 8.6-50 MG tablet Take 2 tablets at bedtime by mouth.     . tamoxifen (NOLVADEX) 20 MG tablet Take 1 tablet (20 mg total) by mouth daily. 90 tablet 3  . traMADol (ULTRAM) 50 MG tablet Take 1 tablet (50 mg total) by mouth 3 (three) times daily. 90 tablet 5  . trolamine salicylate (ASPERCREME) 10 % cream Apply 1 application topically as needed for muscle pain.    . valACYclovir (VALTREX) 500 MG tablet Take 500 mg by mouth 2 (two) times daily as needed.    . warfarin (COUMADIN) 3 MG tablet Take 3 mg by mouth as directed.     No current facility-administered medications for this visit.    PAST MEDICAL HISTORY: Past Medical History:  Diagnosis Date  . Allergy   . Anxiety   . Aortic stenosis    Status post bioprosthetic AVR  . Arthritis    DJD  . Asthma    . Breast cancer ([facility]) 12/13/2016   Left breast  . Chronic systolic CHF (congestive heart failure) ([facility])    EF 30% 04/2014  . COPD (chronic obstructive pulmonary disease) ([facility])   . Coronary artery disease    per pt, had LHC prior to AVR in [place] that did not show any blockages; no stents/bypass  . Gastritis   . Hyperlipidemia   . Hypertension   . Hyperthyroidism   . Multiple thyroid nodules   . Osteoporosis   . Paroxysmal atrial fibrillation ([facility])   . Pelvis fracture ([facility]) 08/19/2015   MULTIPLE   . Personal history of radiation therapy 2018  . Spontaneous pneumothorax 11/28/2015   left   . Stroke ([facility]) 2000   rt hand weak    PAST SURGICAL HISTORY: Past Surgical History:  Procedure Laterality Date  . BREAST LUMPECTOMY Left 12/2016  . BREAST LUMPECTOMY WITH RADIOACTIVE SEED AND SENTINEL LYMPH NODE BIOPSY Left 01/14/2017   Procedure: LEFT BREAST LUMPECTOMY WITH RADIOACTIVE SEED AND SENTINEL LYMPH NODE BIOPSY;  Surgeon: Rolm Bookbinder, MD;  Location: [facility];  Service: General;  Laterality: Left;  . CARDIAC CATHETERIZATION    . CHEST TUBE INSERTION  10/2015  . INTRAMEDULLARY (IM) NAIL INTERTROCHANTERIC Left 12/10/2015   Procedure: INTRAMEDULLARY (IM) NAIL INTERTROCHANTRIC;  Surgeon: Leandrew Koyanagi, MD;  Location: [facility];  Service: Orthopedics;  Laterality: Left;  . PLEURADESIS Left 12/03/2015   Procedure: PLEURADESIS;  Surgeon: Melrose Nakayama, MD;  Location: [facility];  Service: Thoracic;  Laterality: Left;  . RESECTION OF APICAL BLEB Left 12/03/2015   Procedure: BLEBECTOMY;  Surgeon: Melrose Nakayama, MD;  Location: [facility];  Service: Thoracic;  Laterality: Left;  . RIGHT/LEFT HEART CATH AND CORONARY ANGIOGRAPHY N/A 03/10/2020   Procedure: RIGHT/LEFT HEART CATH AND CORONARY ANGIOGRAPHY;  Surgeon: [name], MD;  Location: [facility];  Service: Cardiovascular;  Laterality: N/A;  . THORACOSCOPY  12/03/2015  . VALVE REPLACEMENT    . VIDEO ASSISTED THORACOSCOPY  Left 12/03/2015   Procedure: VIDEO ASSISTED THORACOSCOPY;  Surgeon: Melrose Nakayama, MD;  Location: [facility];  Service: Thoracic;  Laterality: Left;    FAMILY HISTORY: Family History  Problem Relation Age of Onset  . Diabetes Mother   . Heart attack Mother 13  . Diabetes Father   . Lung cancer Father   . Diabetes Sister   . Thyroid disease Sister   . Diabetes Sister   . HIV Brother     SOCIAL HISTORY: Social  History   Socioeconomic History  . Marital status: Married    Spouse name: Sherwood  . Number of children: 0  . Years of education: 66  . Highest education level: 12th grade  Occupational History  . Occupation: Retired in 2004  Tobacco Use  . Smoking status: Former Smoker    Packs/day: 0.25    Years: 10.00    Pack years: 2.50    Types: Cigarettes    Quit date: 03/01/2010    Years since quitting: 10.4  . Smokeless tobacco: Never Used  Vaping Use  . Vaping Use: Never used  Substance and Sexual Activity  . Alcohol use: No  . Drug use: No  . Sexual activity: Not Currently  Other Topics Concern  . Not on file  Social History Narrative   Lives with husband.  Ambulated independently.   Right-handed.   No daily caffeine use.   Social Determinants of Health   Financial Resource Strain: Medium Risk  . Difficulty of Paying Living Expenses: Somewhat hard  Food Insecurity: No Food Insecurity  . Worried About Charity fundraiser in the Last Year: Never true  . Ran Out of Food in the Last Year: Never true  Transportation Needs: No Transportation Needs  . Lack of Transportation (Medical): No  . Lack of Transportation (Non-Medical): No  Physical Activity: Sufficiently Active  . Days of Exercise per Week: 7 days  . Minutes of Exercise per Session: 30 min  Stress: No Stress Concern Present  . Feeling of Stress : Only a little  Social Connections: Moderately Integrated  . Frequency of Communication with Friends and Family: More than three times a week  . Frequency of  Social Gatherings with Friends and Family: More than three times a week  . Attends Religious Services: More than 4 times per year  . Active Member of Clubs or Organizations: No  . Attends Archivist Meetings: Never  . Marital Status: Married  Human resources officer Violence: Not At Risk  . Fear of Current or Ex-Partner: No  . Emotionally Abused: No  . Physically Abused: No  . Sexually Abused: No      [name], M.D. Ph.D.  [name] [address] [place], [place] [address] Ph: [phone] Fax: [phone]  CC:  Alla Feeling, NP [place],  [place] [address]  [name], [name], MD

## 2020-08-01 LAB — SEDIMENTATION RATE: Sed Rate: 6 mm/hr (ref 0–40)

## 2020-08-01 LAB — C-REACTIVE PROTEIN: CRP: 1 mg/L (ref 0–10)

## 2020-08-01 LAB — CK: Total CK: 59 U/L (ref 32–182)

## 2020-08-01 LAB — HGB A1C W/O EAG: Hgb A1c MFr Bld: 5.3 % (ref 4.8–5.6)

## 2020-08-04 ENCOUNTER — Other Ambulatory Visit: Payer: Self-pay

## 2020-08-04 ENCOUNTER — Ambulatory Visit (INDEPENDENT_AMBULATORY_CARE_PROVIDER_SITE_OTHER): Payer: Medicare Other | Admitting: General Practice

## 2020-08-04 DIAGNOSIS — Z7901 Long term (current) use of anticoagulants: Secondary | ICD-10-CM | POA: Diagnosis not present

## 2020-08-04 LAB — POCT INR: INR: 3.6 — AB (ref 2.0–3.0)

## 2020-08-04 NOTE — Patient Instructions (Addendum)
Pre visit review using our clinic review tool, if applicable. No additional management support is needed unless otherwise documented below in the visit note.  Skip dosage today and then change dosage and take 1 (3 mg) tablet daily except take 1/2 tablet on Mondays.   Re-check in 3 to 4 weeks.

## 2020-08-05 ENCOUNTER — Telehealth: Payer: Self-pay

## 2020-08-05 NOTE — Telephone Encounter (Signed)
Sent to [facility] for water aerobics. P: [phone].

## 2020-08-11 NOTE — Progress Notes (Signed)
I have reviewed available documentation from this visit and I agree with recommendations given.  Milos Milligan G. [name], MD  [facility]. [place] office.

## 2020-08-13 ENCOUNTER — Encounter: Payer: Self-pay | Admitting: Skilled Nursing Facility1

## 2020-08-13 ENCOUNTER — Encounter: Payer: Medicare Other | Attending: Family Medicine | Admitting: Skilled Nursing Facility1

## 2020-08-13 ENCOUNTER — Ambulatory Visit: Payer: Medicare Other

## 2020-08-13 ENCOUNTER — Other Ambulatory Visit: Payer: Self-pay

## 2020-08-13 DIAGNOSIS — I509 Heart failure, unspecified: Secondary | ICD-10-CM | POA: Diagnosis not present

## 2020-08-13 DIAGNOSIS — N183 Chronic kidney disease, stage 3 unspecified: Secondary | ICD-10-CM | POA: Diagnosis not present

## 2020-08-13 DIAGNOSIS — I130 Hypertensive heart and chronic kidney disease with heart failure and stage 1 through stage 4 chronic kidney disease, or unspecified chronic kidney disease: Secondary | ICD-10-CM | POA: Insufficient documentation

## 2020-08-13 DIAGNOSIS — Z853 Personal history of malignant neoplasm of breast: Secondary | ICD-10-CM | POA: Insufficient documentation

## 2020-08-13 DIAGNOSIS — E785 Hyperlipidemia, unspecified: Secondary | ICD-10-CM | POA: Insufficient documentation

## 2020-08-13 DIAGNOSIS — Z713 Dietary counseling and surveillance: Secondary | ICD-10-CM | POA: Insufficient documentation

## 2020-08-13 NOTE — Progress Notes (Signed)
Medical Nutrition Therapy  Appointment Start time:  9:15  Appointment End time:  10:45  Primary concerns today: healthy diet  Referral diagnosis: n18.3 Preferred learning style: visual Learning readiness: ready   NUTRITION ASSESSMENT   Clinical Medical Hx: breast cancer, congestive heart failure, HTN, thyroid, osteoporesis, stroke, pneumothroax Medications: see list Labs: A1C 5.3, RBC 3.53, hemoglobin 11.1, HCT 34.1, creatinine 1.21, albumin 3.3, AST 9, GFR 47 Notable Signs/Symptoms: needing wheel chair for breathing issues  Lifestyle & Dietary Hx  Pt states she is allergic to peanuts.  Pt states she will be doing physical therapy soon.  Pt states she goes to bed around 8-9pm.  Had a falling out about a weight loss keto supplement: she wants to take it but her husabnd does not want her to take it due to safety concerns: Dietitian encouraged pt to at least tell her doctor about it.  Pt staets husband states theya re horrible with snacks.   Pt states her doctor has her on a 32 ounce fluid restriction.   Estimated daily fluid intake: unknown oz Supplements: stool softeners, vitamin C, keto weight loss supplement Sleep: pt states "bad", trouble falling asleep and trouble staying asleep Stress / self-care: average; reliant on her husband to take care of her and do the errands Current average weekly physical activity: ADL's  24-Hr Dietary Recall First Meal: lactaid special K + strawberries Snack: cookies Second Meal: ham and cheese and chicken on white bread + mayo  Snack: chocolate Third Meal: frozen pizza + wings Snack: popcorn Beverages: soda: orange crush, sugar free drinks, water, water+ suagr free creamer  Estimated Energy Needs Calories: 1800   NUTRITION INTERVENTION  Nutrition education (E-1) on the following topics:  Balanced meals Supplements  Non starchy vegetables and chronic diease risk reduction  Soy and breast cancer  Handouts Provided Include  Detailed  MyPlate  Learning Style & Readiness for Change Teaching method utilized: Visual & Auditory  Demonstrated degree of understanding via: Teach Back  Barriers to learning/adherence to lifestyle change: none identified   Goals Established by Pt Turn off all electrons for bed and try a paper word search or paper book Ask your doctor if you should take an iron [place]  Do not buy the fanta/soda at all Try sugar free creamer or sweetened soy milk Avoid fried foods completely weather it is chicken or pork chop  Add non starchy vegetables to lunch and dinner   MONITORING & EVALUATION Dietary intake, weekly physical activity  Next Steps  Patient is to follow up in 6 weeks.

## 2020-08-19 ENCOUNTER — Ambulatory Visit: Payer: Medicare Other | Attending: Physical Medicine & Rehabilitation

## 2020-08-19 ENCOUNTER — Other Ambulatory Visit: Payer: Self-pay

## 2020-08-19 DIAGNOSIS — M6281 Muscle weakness (generalized): Secondary | ICD-10-CM | POA: Insufficient documentation

## 2020-08-19 DIAGNOSIS — M545 Low back pain, unspecified: Secondary | ICD-10-CM | POA: Diagnosis not present

## 2020-08-19 DIAGNOSIS — G8929 Other chronic pain: Secondary | ICD-10-CM | POA: Insufficient documentation

## 2020-08-19 DIAGNOSIS — Z7409 Other reduced mobility: Secondary | ICD-10-CM | POA: Insufficient documentation

## 2020-08-19 DIAGNOSIS — R262 Difficulty in walking, not elsewhere classified: Secondary | ICD-10-CM | POA: Diagnosis not present

## 2020-08-19 NOTE — Therapy (Addendum)
[place], [place], [address] Phone: [phone]   Fax:  [phone]  Physical Therapy Evaluation  Patient Details  Name: Danielle [name] MRN: [id number] Date of Birth: June 25, 1945 Referring Provider (PT): Gar Ponto, MD   Encounter Date: 08/19/2020   PT End of Session - 08/19/20 1122     Visit Number 1    Number of Visits 24    Authorization Type [place] Time Period 6th visit FOTO - 10th visit FOTO/reval    Progress Note Due on Visit 10    PT Start Time 1015    PT Stop Time 1110    PT Time Calculation (min) 55 min    Activity Tolerance Patient tolerated treatment well;Patient limited by fatigue    Behavior During Therapy [facility] for tasks assessed/performed             Past Medical History:  Diagnosis Date   Allergy    Anxiety    Aortic stenosis    Status post bioprosthetic AVR   Arthritis    DJD   Asthma    Breast cancer ([place]) 12/13/2016   Left breast   Chronic systolic CHF (congestive heart failure) ([place])    EF 30% 04/2014   COPD (chronic obstructive pulmonary disease) ([place])    Coronary artery disease    per pt, had LHC prior to AVR in [place] 2016 that did not show any blockages; no stents/bypass   Gastritis    Hyperlipidemia    Hypertension    Hyperthyroidism    Multiple thyroid nodules    Osteoporosis    Paroxysmal atrial fibrillation ([place])    Pelvis fracture ([place]) 08/19/2015   MULTIPLE    Personal history of radiation therapy 2018   Spontaneous pneumothorax 11/28/2015   left    Stroke ([place]) 2000   rt hand weak    Past Surgical History:  Procedure Laterality Date   BREAST LUMPECTOMY Left 12/2016   BREAST LUMPECTOMY WITH RADIOACTIVE SEED AND SENTINEL LYMPH NODE BIOPSY Left 01/14/2017   Procedure: LEFT BREAST LUMPECTOMY WITH RADIOACTIVE SEED AND SENTINEL LYMPH NODE BIOPSY;  Surgeon: Rolm Bookbinder, MD;  Location: [place];  Service:  General;  Laterality: Left;   CARDIAC CATHETERIZATION     CHEST TUBE INSERTION  10/2015   INTRAMEDULLARY (IM) NAIL INTERTROCHANTERIC Left 12/10/2015   Procedure: INTRAMEDULLARY (IM) NAIL INTERTROCHANTRIC;  Surgeon: Leandrew Koyanagi, MD;  Location: [facility];  Service: Orthopedics;  Laterality: Left;   PLEURADESIS Left 12/03/2015   Procedure: PLEURADESIS;  Surgeon: Melrose Nakayama, MD;  Location: [place];  Service: Thoracic;  Laterality: Left;   RESECTION OF APICAL BLEB Left 12/03/2015   Procedure: BLEBECTOMY;  Surgeon: Melrose Nakayama, MD;  Location: [place];  Service: Thoracic;  Laterality: Left;   RIGHT/LEFT HEART CATH AND CORONARY ANGIOGRAPHY N/A 03/10/2020   Procedure: RIGHT/LEFT HEART CATH AND CORONARY ANGIOGRAPHY;  Surgeon: [name], MD;  Location: [place] CV LAB;  Service: Cardiovascular;  Laterality: N/A;   THORACOSCOPY  12/03/2015   VALVE REPLACEMENT     VIDEO ASSISTED THORACOSCOPY Left 12/03/2015   Procedure: VIDEO ASSISTED THORACOSCOPY;  Surgeon: Melrose Nakayama, MD;  Location: [facility];  Service: Thoracic;  Laterality: Left;    There were no vitals filed for this visit.    Subjective Assessment - 08/19/20 1022     Subjective Danielle [name] reports that she started to have low back pain after her fall 2 years ago.  She  fractured the left hip and they placed a rod in the hip.  They were unable to perform THA.  She reports having arthritis in most her joints.  She has not had a joint replacement surgery.  She has had a PMH of open heart surgery, pneumothorax, breast cancer, and COPD.  The patient reports that she only gets her low back pain with standing.  The patient denies pain into the legs, numbness/tingling, or change in bowel/lbladder habits.  She is walking with a 4-wheeled walker.  She reports weakness and shortness of breath with activities.  She is unable to perform grocery shopping due to shortness of breath.  When she is cooking at home, she will sit in her wheelchair.   If she goes out for longer duration, she will use her wheelchair.  She feels that her balance is off.    Pertinent History OP, COPD, pneumothorax, arthritis, Obesity, Cornary Artery disease, breast cancer    Limitations Lifting;Standing;Walking;House hold activities    How long can you sit comfortably? 30-40 minutes,  Difficulty with standing    How long can you stand comfortably? 20 minutes with holding onto walker    How long can you walk comfortably? 200 feet with 4 wheeled walker    Patient Stated Goals To be able to walk with a cane    Currently in Pain? Yes    Pain Score 2    pain with standing 7/10   Pain Location Back    Pain Orientation Left;Right    Pain Descriptors / Indicators Pressure    Pain Onset More than a month ago    Aggravating Factors  standing, walking, lifting,    Pain Relieving Factors sitting and medication                [facility] PT Assessment - 08/19/20 0001       Assessment   Medical Diagnosis R26.9 (ICD-10-CM) - Gait abnormality  R20.2 (ICD-10-CM) - Paresthesia  M54.42,M54.41 (ICD-10-CM) - Low back pain with bilateral sciatica, unspecified back pain laterality, unspecified chronicity    Referring Provider (PT) Kristeins, Luanna Salk, MD    Onset Date/Surgical Date --   2 years ago   Hand Dominance Right    Prior Therapy yes post right hip surgery and post radiation      Precautions   Precautions None      Restrictions   Weight Bearing Restrictions No      Balance Screen   Has the patient fallen in the past 6 months No    Has the patient had a decrease in activity level because of a fear of falling?  No    Is the patient reluctant to leave their home because of a fear of falling?  No      Home Environment   Living Environment Private residence    Living Arrangements Spouse/significant other    Type of [place] One level    [place] - 4 wheels;Cane - quad;Cane - single point;Shower seat - built in;Toilet  riser;Wheelchair - manual      Prior Function   Level of Independence Independent    Vocation Retired      Charity fundraiser Status Within Functional Limits for tasks assessed      Observation/Other Assessments   Focus on Therapeutic Outcomes (FOTO)  30% Lumbar      ROM / Strength   AROM / PROM / Strength AROM;Strength  AROM   Overall AROM Comments performed in sitting    Lumbar Flexion 75%    Lumbar Extension 30%    Lumbar - Right Side Bend 75%    Lumbar - Left Side Bend 50%    Lumbar - Right Rotation 75%    Lumbar - Left Rotation 75%      Strength   Right Hip Flexion 4+/5    Right Hip ABduction 4+/5    Right Hip ADduction 4+/5    Left Hip Flexion 4-/5    Left Hip ABduction 4-/5    Left Hip ADduction 4+/5    Right Knee Flexion 4+/5    Right Knee Extension 4+/5    Left Knee Flexion 4+/5    Left Knee Extension 4+/5      Bed Mobility   Supine to Sit Supervision/Verbal cueing   Assist with LE for Sit to supine     Transfers   Five time sit to stand comments  30 seconds, use of hands bilaterally.      Ambulation/Gait   Ambulation/Gait Yes    Ambulation/Gait Assistance 5: Supervision   due to SOB, unsteady   Ambulation/Gait Assistance Details SOB    Ambulation Distance (Feet) 60 Feet    Assistive device 4-wheeled walker    Gait Pattern Step-through pattern;Decreased step length - left;Decreased step length - right;Poor foot clearance - left    Ambulation Surface Level                        Objective measurements completed on examination: See above findings.       [facility] Adult PT Treatment/Exercise - 08/19/20 0001       Exercises   Exercises Lumbar;Knee/Hip      Knee/Hip Exercises: Seated   Long Arc Quad AROM;Both;2 sets;15 reps   alternating   Clamshell with TheraBand Green   10 x 2   Marching AROM;Both;2 sets;20 reps   alternating                   PT Education - 08/19/20 1121     Education Details POC, HEP,  FOTO, Aquatic therapy    Person(s) Educated Patient    Methods Explanation;Handout    Comprehension Verbalized understanding              PT Short Term Goals - 08/19/20 1123       PT SHORT TERM GOAL #1   Title The patient will be independent in an initial HEP of sitting exercises    Target Date 09/09/20               PT Long Term Goals - 08/19/20 1124       PT LONG TERM GOAL #1   Title The patient will be able to transfer sit to stand x 5 reps without use of hands in < or equal to 18 seconds.    Baseline 30 seconds with use of hands    Time 12    Period Weeks    Target Date 11/11/20      PT LONG TERM GOAL #2   Title The patient will be able to ambulate x 200 without SOB and use of FWW for community ambulation.    Baseline 60 feet with SOB and FWW    Time 12    Period Weeks    Target Date 11/11/20      PT LONG TERM GOAL #3   Title The patient will improve FOTO  outcome score to 46% for improvement of functional home activities.    Baseline 30%    Time 12    Period Weeks    Target Date 11/11/20      PT LONG TERM GOAL #4   Title The patient will be able to stand for 20 minutes with low back pain under or equal to 3/10 and use of hands for support    Baseline unable to stand without use of hands and pain 7/10    Time 12    Period Weeks    Target Date 11/11/20                    Plan - 08/19/20 1040     Clinical Impression Statement Danielle [name] is a 75 y/o female who reports onset of low back pain post a fall 2 years ago.  She sustained a left hip frature and she underwent surgery of ORIF with a rod.  She did attend therapy post surgery.  She is still reports being limited with walking and standing due to her low back pain and SOB.  Her goal is to walk with a SEC.  The patient was limited with walking in the clinic today to a distance of 60 feet with her FWW due to SOB.  She has several comorbiditys including COPD.  Lumbar AROM was assessed today in  sitting due to balance issues.  The patient presents with lower extremity weakness, core weakness, and decreae of endurance.  Aquatic therapy was ordered as well.  The patient will likely be limited with her endurance currently in the pool.  Recommend physical therapy for improvement endurance, core stabilization, lower extremity strengthening, and mobility.    Personal Factors and Comorbidities Comorbidity 3+;Fitness    Comorbidities OP, COPD, Coronary artery disease, obesity, arthritis    Examination-Activity Limitations Bathing;Locomotion Level;Transfers;Bed Mobility;Caring for Others;Carry;Squat;Dressing;Stairs;Stand;Toileting;Lift;Sleep    Examination-Participation Restrictions Church;Cleaning;Meal Prep;Community Activity;Laundry;Shop;Volunteer;Yard Work    Merchant navy officer Evolving/Moderate complexity    Clinical Decision Making Moderate    Rehab Potential Good    PT Frequency 2x / week    PT Duration 12 weeks    PT Treatment/Interventions Aquatic Therapy;ADLs/Self Care Home Management;Moist Heat;Cryotherapy;Gait training;Stair training;Functional mobility training;Therapeutic activities;Therapeutic exercise;Balance training;Neuromuscular re-education;Manual techniques;Orthotic Fit/Training;Patient/family education;Dry needling;Passive range of motion;Energy conservation    PT Next Visit Plan reveiw HEP, assess balance, lower extremity/core strengthening, gait training.    PT Home Exercise Plan Access Code: [email or web address]    Consulted and Agree with Plan of Care Patient             Patient will benefit from skilled therapeutic intervention in order to improve the following deficits and impairments:  Abnormal gait, Difficulty walking, Cardiopulmonary status limiting activity, Decreased endurance, Obesity, Decreased activity tolerance, Decreased knowledge of precautions, Decreased skin integrity, Decreased balance, Pain, Decreased mobility, Decreased strength  Visit  Diagnosis: Difficulty in walking, not elsewhere classified - Plan: PT plan of care cert/re-cert  Decreased functional mobility and endurance - Plan: PT plan of care cert/re-cert  Muscle weakness (generalized) - Plan: PT plan of care cert/re-cert  Chronic bilateral low back pain without sciatica - Plan: PT plan of care cert/re-cert     Problem List Patient Active Problem List   Diagnosis Date Noted   Gait abnormality 07/31/2020   Paresthesia 07/31/2020   Low back pain with bilateral sciatica 07/31/2020   Pneumonia due to COVID-19 virus    Bacteremia due to Gram-positive bacteria 08/29/2018   Abscess    Cellulitis of  left breast 08/28/2018   Cellulitis of left abdominal wall 08/28/2018   Encounter for routine gynecological examination 04/28/2018   CKD (chronic kidney disease), stage III ([place]) 09/27/2017   Recurrent genital herpes 04/13/2017   Chronic pain disorder 01/25/2017   Malignant neoplasm of upper-outer quadrant of left breast in female, estrogen receptor positive ([place]) 12/16/2016   Chronic knee pain 12/07/2016   Chronic bilateral low back pain without sciatica 12/07/2016   Peripheral musculoskeletal gait disorder 12/07/2016   Encounter for therapeutic drug monitoring 06/16/2016   Generalized osteoarthritis of multiple sites 05/04/2016   Chronic anticoagulation 05/04/2016   Stroke ([place]) 04/14/2016   Aortic valve disease 04/10/2016   S/P AVR 04/10/2016   NICM (nonischemic cardiomyopathy) ([place]) 04/10/2016   Upper airway cough syndrome 03/14/2016   Morbid obesity ([place]) 03/14/2016   COPD (chronic obstructive pulmonary disease) ([place]) 03/11/2016   Thrombocytopenia ([place]) 12/12/2015   Anemia 12/12/2015   Vitamin D deficiency 12/12/2015   Acute urinary retention 12/11/2015   Osteoporosis 12/11/2015   Hip fracture ([place]) 12/10/2015   Aortic atherosclerosis ([place]) 12/10/2015   Lung blebs ([place]) 12/03/2015   Pelvic fracture ([place]) 08/19/2015   Fall at home 08/19/2015    Essential hypertension 08/19/2015   Mixed hyperlipidemia 08/19/2015   Asthma 08/19/2015   Paroxysmal atrial fibrillation ([place]) 08/19/2015   Hyperthyroidism 08/19/2015   Chronic HFrEF (heart failure with reduced ejection fraction) (HCC)    Coronary artery disease    Aortic stenosis    Rich Number, PT, DPT, OCS, Crt. DN  Bethena Midget 08/19/2020, 11:53 AM  [facility] [address] [place], [place], [address] Phone: [phone]   Fax:  [phone]  Name: Danielle [name] MRN: [id number] Date of Birth: 12-07-1945

## 2020-08-19 NOTE — Patient Instructions (Addendum)
Aquatic Therapy at [place] [facility] [place]-  What to Expect!  Where:   [facility] [place], [place] [address] Rehab phone [phone]  NOTE:  You will receive an automated phone message reminding you of your appt and it will say the appointment is at the [facility] clinic.          How to Prepare: Please make sure you drink 8 ounces of water about one hour prior to your pool session A caregiver may attend if needed with the patient to help assist as needed. A caregiver can sit in the pool room on chair. Please arrive IN YOUR SUIT and 15 minutes prior to your appointment - this helps to avoid delays in starting your session. Please make sure to attend to any toileting needs prior to entering the pool [place] rooms for changing are provided.   There is direct access to the pool deck form the locker room.  You can lock your belongings in a locker with lock provided. Once on the pool deck your therapist will ask if you have signed the Patient  Consent and Assignment of Benefits form before beginning treatment Your therapist may take your blood pressure prior to, during and after your session if indicated We usually try and create a home exercise program based on activities we do in the pool.  Please be thinking about who might be able to assist you in the pool should you need to participate in an aquatic home exercise program at the time of discharge if you need assistance.  Some patients do not want to or do not have the ability to participate in an aquatic home program - this is not a barrier in any way to you participating in aquatic therapy as part of your current therapy plan! After Discharge from PT, you can continue using home program at  the [facility], there is a drop-in fee for $5 ($45 a month)or for 60 years  or older $4.00 ($40 a month for seniors ) or any local [employer] pool.  Memberships for purchase are  available for gym/pool at [facility] LAST AQUATIC VISIT.  PLEASE MAKE SURE THAT YOU HAVE A [address]  APPOINTMENT SCHEDULED.   About the pool: Pool is located approximately 500 FT from the entrance of the building.  Please bring a support person if you need assistance traveling this      distance.   Your therapist will assist you in entering the water; there are multiple ways to           enter including stairs with railings, a walk in ramp, a roll in chair and a                      mechanical lift. Your therapist will determine the most appropriate way for you.  Water temperature is usually between 88-90 degrees  There may be up to 2 other swimmers in the pool at the same time  The pool deck is tile, please wear shoes with good traction if you prefer not to be barefoot.    Contact Info:  For appointment scheduling and cancellations:         Please call the [facility]  PH:[phone]              Aquatic Therapy  Outpatient  Rehabilitation @ [facility]       All sessions are 45 minutes                                                     Access Code: [email or web address] URL: [email or web address] Date: 08/19/2020 Prepared by: Rich Number  Exercises Seated March - 1 x daily - 7 x weekly - 2 sets - 20 reps Seated Hip Abduction with Resistance - 1 x daily - 7 x weekly - 2 sets - 10 reps - 5 hold Seated Long Arc Quad - 1 x daily - 7 x weekly - 2 sets - 15 reps

## 2020-08-20 ENCOUNTER — Other Ambulatory Visit (HOSPITAL_COMMUNITY)
Admission: RE | Admit: 2020-08-20 | Discharge: 2020-08-20 | Disposition: A | Discharge status: Home / Self Care | Payer: Medicare Other | Source: Ambulatory Visit | Attending: Obstetrics and Gynecology | Admitting: Obstetrics and Gynecology

## 2020-08-20 ENCOUNTER — Encounter: Payer: Self-pay | Admitting: Obstetrics and Gynecology

## 2020-08-20 ENCOUNTER — Ambulatory Visit: Payer: Medicare Other | Admitting: Obstetrics and Gynecology

## 2020-08-20 VITALS — BP 126/88 | HR 68

## 2020-08-20 DIAGNOSIS — Z124 Encounter for screening for malignant neoplasm of cervix: Secondary | ICD-10-CM

## 2020-08-20 DIAGNOSIS — Z01411 Encounter for gynecological examination (general) (routine) with abnormal findings: Secondary | ICD-10-CM | POA: Diagnosis not present

## 2020-08-20 DIAGNOSIS — N950 Postmenopausal bleeding: Secondary | ICD-10-CM | POA: Diagnosis present

## 2020-08-20 DIAGNOSIS — Z1151 Encounter for screening for human papillomavirus (HPV): Secondary | ICD-10-CM | POA: Insufficient documentation

## 2020-08-20 DIAGNOSIS — Z634 Disappearance and death of family member: Secondary | ICD-10-CM

## 2020-08-20 NOTE — Progress Notes (Signed)
GYNECOLOGY  VISIT   HPI: 75 y.o.   Married  African American  female   G0P0. with No LMP recorded. Patient is postmenopausal.   here for vaginal bleeding with intercourse x1 month. No pain with intercourse.   No bleeding outside of intercourse.  History of dilation and curettage many years ago before menopause.  Benign pathology.   Has COPD.  Does not use O2.  Had pneumothorax x 2.   Had 2 boosters for Covid.  She did have Covid pneumonia.  Sister died recently.  She was on dialysis.  Patient still struggling with the loss.   GYNECOLOGIC HISTORY: No LMP recorded. Patient is postmenopausal. Contraception:  PMP Menopausal hormone therapy:  none Last mammogram:  01-16-20  Diag.Bil./status post Lt.lumpectomy 2018/Neg/BiRads2 Last pap smear: Years ago normal always        OB History     Gravida  0   Para  0   Term  0   Preterm  0   AB  0   Living  0      SAB  0   IAB  0   Ectopic  0   Multiple  0   Live Births  0              Patient Active Problem List   Diagnosis Date Noted   Gait abnormality 07/31/2020   Paresthesia 07/31/2020   Low back pain with bilateral sciatica 07/31/2020   Pneumonia due to COVID-19 virus    Bacteremia due to Gram-positive bacteria 08/29/2018   Abscess    Cellulitis of left breast 08/28/2018   Cellulitis of left abdominal wall 08/28/2018   Encounter for routine gynecological examination 04/28/2018   CKD (chronic kidney disease), stage III ([place]) 09/27/2017   Recurrent genital herpes 04/13/2017   Chronic pain disorder 01/25/2017   Malignant neoplasm of upper-outer quadrant of left breast in female, estrogen receptor positive ([place]) 12/16/2016   Chronic knee pain 12/07/2016   Chronic bilateral low back pain without sciatica 12/07/2016   Peripheral musculoskeletal gait disorder 12/07/2016   Encounter for therapeutic drug monitoring 06/16/2016   Generalized osteoarthritis of multiple sites 05/04/2016   Chronic  anticoagulation 05/04/2016   Stroke ([place]) 04/14/2016   Aortic valve disease 04/10/2016   S/P AVR 04/10/2016   NICM (nonischemic cardiomyopathy) ([place]) 04/10/2016   Upper airway cough syndrome 03/14/2016   Morbid obesity ([place]) 03/14/2016   COPD (chronic obstructive pulmonary disease) ([place]) 03/11/2016   Thrombocytopenia ([place]) 12/12/2015   Anemia 12/12/2015   Vitamin D deficiency 12/12/2015   Acute urinary retention 12/11/2015   Osteoporosis 12/11/2015   Hip fracture ([place]) 12/10/2015   Aortic atherosclerosis ([place]) 12/10/2015   Lung blebs ([place]) 12/03/2015   Pelvic fracture ([place]) 08/19/2015   Fall at home 08/19/2015   Essential hypertension 08/19/2015   Mixed hyperlipidemia 08/19/2015   Asthma 08/19/2015   Paroxysmal atrial fibrillation ([place]) 08/19/2015   Hyperthyroidism 08/19/2015   Chronic HFrEF (heart failure with reduced ejection fraction) ([place])    Coronary artery disease    Aortic stenosis     Past Medical History:  Diagnosis Date   Allergy    Anxiety    Aortic stenosis    Status post bioprosthetic AVR   Arthritis    DJD   Asthma    Breast cancer ([place]) 12/13/2016   Left breast   Chronic systolic CHF (congestive heart failure) ([place])    EF 30% 04/2014   COPD (chronic obstructive pulmonary disease) ([place])    Coronary artery  disease    per pt, had LHC prior to AVR in [place] that did not show any blockages; no stents/bypass   Gastritis    Hyperlipidemia    Hypertension    Hyperthyroidism    Multiple thyroid nodules    Osteoporosis    Paroxysmal atrial fibrillation ([place])    Pelvis fracture ([place]) 08/19/2015   MULTIPLE    Personal history of radiation therapy 2018   Spontaneous pneumothorax 11/28/2015   left    Stroke ([place]) 2000   rt hand weak    Past Surgical History:  Procedure Laterality Date   BREAST LUMPECTOMY Left 12/2016   BREAST LUMPECTOMY WITH RADIOACTIVE SEED AND SENTINEL LYMPH NODE BIOPSY Left 01/14/2017   Procedure: LEFT BREAST LUMPECTOMY WITH  RADIOACTIVE SEED AND SENTINEL LYMPH NODE BIOPSY;  Surgeon: Rolm Bookbinder, MD;  Location: [facility];  Service: General;  Laterality: Left;   CARDIAC CATHETERIZATION     CHEST TUBE INSERTION  10/2015   INTRAMEDULLARY (IM) NAIL INTERTROCHANTERIC Left 12/10/2015   Procedure: INTRAMEDULLARY (IM) NAIL INTERTROCHANTRIC;  Surgeon: Leandrew Koyanagi, MD;  Location: [facility];  Service: Orthopedics;  Laterality: Left;   PLEURADESIS Left 12/03/2015   Procedure: PLEURADESIS;  Surgeon: Melrose Nakayama, MD;  Location: [facility];  Service: Thoracic;  Laterality: Left;   RESECTION OF APICAL BLEB Left 12/03/2015   Procedure: BLEBECTOMY;  Surgeon: Melrose Nakayama, MD;  Location: [facility];  Service: Thoracic;  Laterality: Left;   RIGHT/LEFT HEART CATH AND CORONARY ANGIOGRAPHY N/A 03/10/2020   Procedure: RIGHT/LEFT HEART CATH AND CORONARY ANGIOGRAPHY;  Surgeon: [name], MD;  Location: [facility];  Service: Cardiovascular;  Laterality: N/A;   THORACOSCOPY  12/03/2015   VALVE REPLACEMENT     VIDEO ASSISTED THORACOSCOPY Left 12/03/2015   Procedure: VIDEO ASSISTED THORACOSCOPY;  Surgeon: Melrose Nakayama, MD;  Location: [facility];  Service: Thoracic;  Laterality: Left;    Current Outpatient Medications  Medication Sig Dispense Refill   albuterol (PROVENTIL HFA;VENTOLIN HFA) 108 (90 Base) MCG/ACT inhaler Inhale 2 puffs into the lungs every 6 (six) hours as needed for wheezing or shortness of breath.      alendronate (FOSAMAX) 70 MG tablet Take 1 tablet (70 mg total) by mouth once a week. Take with a full glass of water on an empty stomach. 13 tablet 3   ascorbic acid (VITAMIN C) 500 MG tablet Take 500 mg by mouth daily.     aspirin EC 81 MG tablet Take 81 mg daily by mouth.     budesonide-formoterol (SYMBICORT) 160-4.5 MCG/ACT inhaler Inhale 2 puffs into the lungs daily as needed.     carvedilol (COREG) 25 MG tablet Take 25 mg by mouth 2 (two) times daily with a meal.     Cholecalciferol 25 MCG (1000 UT)  tablet Take 1,000 Units by mouth daily.     diclofenac sodium (VOLTAREN) 1 % GEL Apply 4 g topically 4 (four) times daily. 500 g 3   diphenhydramine-acetaminophen (TYLENOL PM) 25-500 MG TABS tablet Take 2 tablets by mouth at bedtime.     DULoxetine (CYMBALTA) 60 MG capsule Take 1 capsule (60 mg total) by mouth daily. 30 capsule 12   fluticasone (FLONASE) 50 MCG/ACT nasal spray Place 1 spray into both nostrils daily. 16 g 3   furosemide (LASIX) 20 MG tablet Take 2 tablets (40 mg total) by mouth daily. (Patient taking differently: Take 20 mg by mouth daily.) 60 tablet 1   MELATONIN PO Take 5 mg by mouth at  bedtime.     methimazole (TAPAZOLE) 5 MG tablet Take 3 tablets (15 mg total) by mouth daily. 270 tablet 2   pantoprazole (PROTONIX) 40 MG tablet Take 1 tablet (40 mg total) by mouth daily. 30 tablet 0   rosuvastatin (CRESTOR) 40 MG tablet TAKE 1 TABLET BY MOUTH  DAILY 90 tablet 3   sacubitril-valsartan (ENTRESTO) 49-51 MG Take 1 tablet by mouth 2 (two) times daily.     senna-docusate (SENOKOT-S) 8.6-50 MG tablet Take 2 tablets at bedtime by mouth.      tamoxifen (NOLVADEX) 20 MG tablet Take 1 tablet (20 mg total) by mouth daily. 90 tablet 3   traMADol (ULTRAM) 50 MG tablet Take 1 tablet (50 mg total) by mouth 3 (three) times daily. 90 tablet 5   trolamine salicylate (ASPERCREME) 10 % cream Apply 1 application topically as needed for muscle pain.     valACYclovir (VALTREX) 500 MG tablet Take 500 mg by mouth 2 (two) times daily as needed.     warfarin (COUMADIN) 3 MG tablet Take 3 mg by mouth as directed.     No current facility-administered medications for this visit.     ALLERGIES: Lisinopril, Tetanus toxoid adsorbed, and Other  Family History  Problem Relation Age of Onset   Diabetes Mother    Heart attack Mother 14   Diabetes Father    Lung cancer Father    Diabetes Sister    Thyroid disease Sister    Diabetes Sister    HIV Brother     Social History   Socioeconomic History    Marital status: Married    Spouse name: [name]   Number of children: 0   Years of education: 12   Highest education level: 12th grade  Occupational History   Occupation: Retired in 2004  Tobacco Use   Smoking status: Former    Packs/day: 0.25    Years: 10.00    Pack years: 2.50    Types: Cigarettes    Quit date: 03/01/2010    Years since quitting: 10.4   Smokeless tobacco: Never  Vaping Use   Vaping Use: Never used  Substance and Sexual Activity   Alcohol use: No   Drug use: No   Sexual activity: Yes    Birth control/protection: Post-menopausal  Other Topics Concern   Not on file  Social History Narrative   Lives with husband.  Ambulated independently.   Right-handed.   No daily caffeine use.   Social Determinants of Health   Financial Resource Strain: Medium Risk   Difficulty of Paying Living Expenses: Somewhat hard  Food Insecurity: No Food Insecurity   Worried About Charity fundraiser in the Last Year: Never true   Ran Out of Food in the Last Year: Never true  Transportation Needs: No Transportation Needs   Lack of Transportation (Medical): No   Lack of Transportation (Non-Medical): No  Physical Activity: Sufficiently Active   Days of Exercise per Week: 7 days   Minutes of Exercise per Session: 30 min  Stress: No Stress Concern Present   Feeling of Stress : Only a little  Social Connections: Moderately Integrated   Frequency of Communication with Friends and Family: More than three times a week   Frequency of Social Gatherings with Friends and Family: More than three times a week   Attends Religious Services: More than 4 times per year   Active Member of [employer] or Organizations: No   Attends Archivist Meetings: Never   Marital  Status: Married  Human resources officer Violence: Not At Risk   Fear of Current or Ex-Partner: No   Emotionally Abused: No   Physically Abused: No   Sexually Abused: No    Review of Systems  Genitourinary:  Positive for  vaginal bleeding.  All other systems reviewed and are negative.  PHYSICAL EXAMINATION:    BP 126/88 (Cuff Size: Large)   Pulse 68   SpO2 99%     General appearance: alert, cooperative and appears stated age Head: Normocephalic, without obvious abnormality, atraumatic Neck: no adenopathy, supple, symmetrical, trachea midline and thyroid normal to inspection and palpation Heart: regular rate and rhythm Abdomen: soft, non-tender, no masses,  no organomegaly Extremities: extremities normal, atraumatic, no cyanosis or edema Skin: Skin color, texture, turgor normal. No rashes or lesions Lymph nodes: Cervical, supraclavicular nodes normal.  No abnormal inguinal nodes palpated Neurologic: Grossly normal  Pelvic: External genitalia:  no lesions              Urethra:  normal appearing urethra with no masses, tenderness or lesions              Bartholins and Skenes: normal                 Vagina: normal appearing vagina with normal color and discharge, no lesions              Cervix:  very small by palpation.  Blind pap performed.  Atrophy of vaginal noted.                 Bimanual Exam:  Uterus:  normal size, contour, position, consistency, mobility, non-tender              Adnexa: no mass, fullness, tenderness              Rectal exam: yes.  Confirms.              Anus:  normal sphincter tone, no lesions  Chaperone was present for exam.  ASSESSMENT  Postmenopausal bleeding.  Chronic renal disease.  Atrial fibrillation.  On coumadin.  Left breast cancer.  Status post lumpectomy, XRT. On tamoxifen.  Hx cardiac surgery.  Hx HSV.    Bereavement.   PLAN  We discussed postmenopausal bleeding.  Etiologies. Include atrophy, infection, polyps, precancer and cancer.  Effect of Tamoxifen on endometrium discussed.  Pap and HR HPV testing.  Return for [place].  If needs endometrial sampling, would do in OR setting.  I discussed bereavement counseling through Hosp[facility].     37 min total time was  spent for this patient encounter, including preparation, face-to-face counseling with the patient, coordination of care, and documentation of the encounter.

## 2020-08-20 NOTE — Patient Instructions (Signed)
Postmenopausal Bleeding Postmenopausal bleeding is any bleeding that occurs after menopause. Menopause is a time in a woman's life when monthly periods stop. Any type of bleeding after menopause should be checked by your doctor. Treatment will depend on thecause. This kind of bleeding can be caused by: Taking hormones during menopause. Low or high amounts of female hormones in the body. This can cause the lining of the womb (uterus) to become too thin or too thick. Cancer. Growths in the womb that are not cancer. Follow these instructions at home:  Watch for any changes in your symptoms. Let your doctor know about them. Avoid using tampons and douches as told by your doctor. Change your pads regularly. Get regular pelvic exams. This includes Pap tests. Take iron pills as told by your doctor. Take over-the-counter and prescription medicines only as told by your doctor. Keep all follow-up visits. Contact a doctor if: You have new bleeding from the vagina after menopause. You have pain in your belly (abdomen). Get help right away if: You have a fever or chills. You have very bad pain with bleeding. You have clumps of blood (blood clots) coming from your vagina. You have a lot of bleeding, and: You use more than 1 pad an hour. This kind of bleeding has never happened before. You have headaches. You feel dizzy or you feel like you are going to pass out (faint). Summary Any type of bleeding after menopause should be checked by your doctor. Avoid using tampons or douches. Get regular pelvic exams. This includes Pap tests. Contact a doctor if you have new bleeding or pain in your belly. Watch for any changes in your symptoms. Let your doctor know about them. This information is not intended to replace advice given to you by your health care provider. Make sure you discuss any questions you have with your healthcare provider. Document Revised: 08/02/2019 Document Reviewed:  08/02/2019 Elsevier Patient Education  [place].

## 2020-08-21 LAB — CYTOLOGY - PAP
Comment: NEGATIVE
Diagnosis: NEGATIVE
High risk HPV: NEGATIVE

## 2020-08-25 ENCOUNTER — Ambulatory Visit (INDEPENDENT_AMBULATORY_CARE_PROVIDER_SITE_OTHER): Payer: Medicare Other | Admitting: General Practice

## 2020-08-25 ENCOUNTER — Other Ambulatory Visit: Payer: Self-pay

## 2020-08-25 DIAGNOSIS — Z7901 Long term (current) use of anticoagulants: Secondary | ICD-10-CM

## 2020-08-25 LAB — POCT INR: INR: 3.9 — AB (ref 2.0–3.0)

## 2020-08-25 NOTE — Patient Instructions (Addendum)
Pre visit review using our clinic review tool, if applicable. No additional management support is needed unless otherwise documented below in the visit note.   Skip dosage today and take 1/2 tablet omorrow.  Then change dosage and take 1 (3 mg) tablet daily except take 1/2 tablet on Mondays Wednesdays and Fridays.  Re-check in 3 to 4 weeks.

## 2020-08-26 ENCOUNTER — Ambulatory Visit (HOSPITAL_BASED_OUTPATIENT_CLINIC_OR_DEPARTMENT_OTHER): Payer: Medicare Other | Attending: Neurology | Admitting: Physical Therapy

## 2020-08-26 ENCOUNTER — Encounter (HOSPITAL_BASED_OUTPATIENT_CLINIC_OR_DEPARTMENT_OTHER): Payer: Self-pay | Admitting: Physical Therapy

## 2020-08-26 DIAGNOSIS — R293 Abnormal posture: Secondary | ICD-10-CM | POA: Insufficient documentation

## 2020-08-26 DIAGNOSIS — M6281 Muscle weakness (generalized): Secondary | ICD-10-CM | POA: Diagnosis not present

## 2020-08-26 DIAGNOSIS — R262 Difficulty in walking, not elsewhere classified: Secondary | ICD-10-CM | POA: Diagnosis not present

## 2020-08-26 DIAGNOSIS — M25561 Pain in right knee: Secondary | ICD-10-CM | POA: Diagnosis not present

## 2020-08-26 DIAGNOSIS — R2689 Other abnormalities of gait and mobility: Secondary | ICD-10-CM | POA: Insufficient documentation

## 2020-08-26 DIAGNOSIS — Z7409 Other reduced mobility: Secondary | ICD-10-CM | POA: Diagnosis not present

## 2020-08-26 DIAGNOSIS — M545 Low back pain, unspecified: Secondary | ICD-10-CM | POA: Insufficient documentation

## 2020-08-26 DIAGNOSIS — M25562 Pain in left knee: Secondary | ICD-10-CM | POA: Diagnosis not present

## 2020-08-26 DIAGNOSIS — G8929 Other chronic pain: Secondary | ICD-10-CM | POA: Insufficient documentation

## 2020-08-26 NOTE — Therapy (Signed)
[facility] [address] [place], [place], [address] Phone: [phone]   Fax:  [phone]  Physical Therapy Treatment  Patient Details  Name: Danielle [name] MRN: [id number] Date of Birth: [birth year] Referring Provider (PT): Gar Ponto, MD   Encounter Date: 08/26/2020   PT End of Session - 08/26/20 0948     Visit Number 2    Number of Visits 24    Authorization Type [employer] Time Period 6th visit FOTO - 10th visit FOTO/reval    PT Start Time 0900    PT Stop Time 0941    PT Time Calculation (min) 41 min             Past Medical History:  Diagnosis Date   Allergy    Anxiety    Aortic stenosis    Status post bioprosthetic AVR   Arthritis    DJD   Asthma    Breast cancer ([place]) 12/13/2016   Left breast   Chronic systolic CHF (congestive heart failure) ([place])    EF 30% 04/2014   COPD (chronic obstructive pulmonary disease) ([place])    Coronary artery disease    per pt, had LHC prior to AVR in [place] 2016 that did not show any blockages; no stents/bypass   Gastritis    Hyperlipidemia    Hypertension    Hyperthyroidism    Multiple thyroid nodules    Osteoporosis    Paroxysmal atrial fibrillation ([place])    Pelvis fracture ([place]) 08/19/2015   MULTIPLE    Personal history of radiation therapy 2018   Spontaneous pneumothorax 11/28/2015   left    Stroke ([place]) 2000   rt hand weak    Past Surgical History:  Procedure Laterality Date   BREAST LUMPECTOMY Left 12/2016   BREAST LUMPECTOMY WITH RADIOACTIVE SEED AND SENTINEL LYMPH NODE BIOPSY Left 01/14/2017   Procedure: LEFT BREAST LUMPECTOMY WITH RADIOACTIVE SEED AND SENTINEL LYMPH NODE BIOPSY;  Surgeon: Rolm Bookbinder, MD;  Location: [facility];  Service: General;  Laterality: Left;   CARDIAC CATHETERIZATION     CHEST TUBE INSERTION  10/2015   INTRAMEDULLARY (IM) NAIL INTERTROCHANTERIC Left 12/10/2015   Procedure: INTRAMEDULLARY  (IM) NAIL INTERTROCHANTRIC;  Surgeon: Leandrew Koyanagi, MD;  Location: [facility];  Service: Orthopedics;  Laterality: Left;   PLEURADESIS Left 12/03/2015   Procedure: PLEURADESIS;  Surgeon: Melrose Nakayama, MD;  Location: [facility];  Service: Thoracic;  Laterality: Left;   RESECTION OF APICAL BLEB Left 12/03/2015   Procedure: BLEBECTOMY;  Surgeon: Melrose Nakayama, MD;  Location: [facility];  Service: Thoracic;  Laterality: Left;   RIGHT/LEFT HEART CATH AND CORONARY ANGIOGRAPHY N/A 03/10/2020   Procedure: RIGHT/LEFT HEART CATH AND CORONARY ANGIOGRAPHY;  Surgeon: [name], MD;  Location: [facility];  Service: Cardiovascular;  Laterality: N/A;   THORACOSCOPY  12/03/2015   VALVE REPLACEMENT     VIDEO ASSISTED THORACOSCOPY Left 12/03/2015   Procedure: VIDEO ASSISTED THORACOSCOPY;  Surgeon: Melrose Nakayama, MD;  Location: [facility];  Service: Thoracic;  Laterality: Left;    There were no vitals filed for this visit.   Subjective Assessment - 08/26/20 0943     Subjective Pt introduced to aquatic setting.  She is extremely apprehensive but given time she sowly acclimates. Uses water walker ambulates with constant VC and cga.  Reports she is having no pain while in water.  Requires min assist with entering and exiting pool via steps.  Had tired therapy  in neighborhood pool with home health PT but she reports she was too afraid.  She will benefit from therapy in pool environment.  She tolerated 41 minutes sitting briefly on water wall. Remainder of time pt standing, walking and exercising.    Patient is accompained by: Family member    Pertinent History OP, COPD, pneumothorax, arthritis, Obesity, Cornary Artery disease, breast cancer    How long can you sit comfortably? 30-40 minutes,  Difficulty with standing    How long can you stand comfortably? 20 minutes with holding onto walker    How long can you walk comfortably? 200 feet with 4 wheeled walker    Currently in Pain? Yes    Pain Score 2      Pain Location Back    Pain Orientation Right;Left    Pain Descriptors / Indicators Aching    Pain Type Chronic pain             Pt seen for aquatic therapy today.  Treatment took place in water 3.25-4.8 ft in depth at the [employer] pool. Temp of water was 91.  Pt entered/exited the pool via stairs step to pattern minimal assisted with bilat rail. Exiting pt sits in [facility]  Standing Pt water walks using water walker multiple widths of pool with vc and cga to sba.  Pt encouraged to push walker down, up, rotate around body, to get a feel for being in the water and the properties.  Cuing for decreased guarding.  Pt with cga to turn water perturbations frightening her Pt completed toe raises 4 x 10, knee flex x 10 reps, add/abd 2 x 10 reps.  High knee marching across pool x 2.  Sidestepping holding to pool wall.  Seated Knee flex/ext and marching 2 x 10 reps.  Instruction on core strength completion of isometric tummy tucks and glut sets.  Pt report fatigue upon completion gaining core and le focus.   Pt requires buoyancy for support and to offload joints with strengthening exercises. Viscosity of the water is needed for resistance of strengthening; water current perturbations provides challenge to standing balance unsupported, requiring increased core activation.                             PT Short Term Goals - 08/19/20 1123       PT SHORT TERM GOAL #1   Title The patient will be independent in an initial HEP of sitting exercises    Target Date 09/09/20               PT Long Term Goals - 08/19/20 1124       PT LONG TERM GOAL #1   Title The patient will be able to transfer sit to stand x 5 reps without use of hands in < or equal to 18 seconds.    Baseline 30 seconds with use of hands    Time 12    Period Weeks    Target Date 11/11/20      PT LONG TERM GOAL #2   Title The patient will be able to ambulate x 200 without SOB and use of FWW  for community ambulation.    Baseline 60 feet with SOB and FWW    Time 12    Period Weeks    Target Date 11/11/20      PT LONG TERM GOAL #3   Title The patient will improve FOTO outcome score to 46%  for improvement of functional home activities.    Baseline 30%    Time 12    Period Weeks    Target Date 11/11/20      PT LONG TERM GOAL #4   Title The patient will be able to stand for 20 minutes with low back pain under or equal to 3/10 and use of hands for support    Baseline unable to stand without use of hands and pain 7/10    Time 12    Period Weeks    Target Date 11/11/20                   Plan - 08/26/20 1327     Clinical Impression Statement Pt pleasent bur apprehensive about getting in water.  She an husband VU of benefits if aquatic therapy.  Pt requires most of session acclimating to the environemnt.  She was able to use water walker, sit on water bench and hold to wall with continued encouragement and given extra time.  She repors no pain in knees throughout session and stays active ofr length of session.  Pt excellent cadidate fro aquatic therapy.    Personal Factors and Comorbidities Comorbidity 3+;Fitness    Comorbidities OP, COPD, Coronary artery disease, obesity, arthritis    Examination-Activity Limitations Bathing;Locomotion Level;Transfers;Bed Mobility;Caring for Others;Carry;Squat;Dressing;Stairs;Stand;Toileting;Lift;Sleep    Examination-Participation Restrictions Church;Cleaning;Meal Prep;Community Activity;Laundry;Shop;Volunteer;Yard Work    Merchant navy officer Evolving/Moderate complexity    Rehab Potential Good    PT Frequency 2x / week    PT Duration 12 weeks    PT Treatment/Interventions Aquatic Therapy;ADLs/Self Care Home Management;Moist Heat;Cryotherapy;Gait training;Stair training;Functional mobility training;Therapeutic activities;Therapeutic exercise;Balance training;Neuromuscular re-education;Manual techniques;Orthotic  Fit/Training;Patient/family education;Dry needling;Passive range of motion;Energy conservation    PT Next Visit Plan reveiw HEP, assess balance, lower extremity/core strengthening, gait training.    PT Home Exercise Plan Access Code: [email or web address]    Consulted and Agree with Plan of Care Patient;Family member/caregiver    Family Member Consulted husband             Patient will benefit from skilled therapeutic intervention in order to improve the following deficits and impairments:  Abnormal gait, Difficulty walking, Cardiopulmonary status limiting activity, Decreased endurance, Obesity, Decreased activity tolerance, Decreased knowledge of precautions, Decreased skin integrity, Decreased balance, Pain, Decreased mobility, Decreased strength  Visit Diagnosis: Difficulty in walking, not elsewhere classified  Decreased functional mobility and endurance  Muscle weakness (generalized)  Chronic bilateral low back pain without sciatica  Chronic pain of left knee  Chronic pain of right knee  Abnormal posture  Peripheral musculoskeletal gait disorder     Problem List Patient Active Problem List   Diagnosis Date Noted   Gait abnormality 07/31/2020   Paresthesia 07/31/2020   Low back pain with bilateral sciatica 07/31/2020   Pneumonia due to COVID-19 virus    Bacteremia due to Gram-positive bacteria 08/29/2018   Abscess    Cellulitis of left breast 08/28/2018   Cellulitis of left abdominal wall 08/28/2018   Encounter for routine gynecological examination 04/28/2018   CKD (chronic kidney disease), stage III ([place]) 09/27/2017   Recurrent genital herpes 04/13/2017   Chronic pain disorder 01/25/2017   Malignant neoplasm of upper-outer quadrant of left breast in female, estrogen receptor positive ([place]) 12/16/2016   Chronic knee pain 12/07/2016   Chronic bilateral low back pain without sciatica 12/07/2016   Peripheral musculoskeletal gait disorder 12/07/2016   Encounter for therapeutic  drug monitoring 06/16/2016   Generalized osteoarthritis of multiple sites 05/04/2016  Chronic anticoagulation 05/04/2016   Stroke ([place]) 04/14/2016   Aortic valve disease 04/10/2016   S/P AVR 04/10/2016   NICM (nonischemic cardiomyopathy) ([place]) 04/10/2016   Upper airway cough syndrome 03/14/2016   Morbid obesity ([place]) 03/14/2016   COPD (chronic obstructive pulmonary disease) ([place]) 03/11/2016   Thrombocytopenia ([place]) 12/12/2015   Anemia 12/12/2015   Vitamin D deficiency 12/12/2015   Acute urinary retention 12/11/2015   Osteoporosis 12/11/2015   Hip fracture ([place]) 12/10/2015   Aortic atherosclerosis ([place]) 12/10/2015   Lung blebs ([place]) 12/03/2015   Pelvic fracture ([place]) 08/19/2015   Fall at home 08/19/2015   Essential hypertension 08/19/2015   Mixed hyperlipidemia 08/19/2015   Asthma 08/19/2015   Paroxysmal atrial fibrillation ([place]) 08/19/2015   Hyperthyroidism 08/19/2015   Chronic HFrEF (heart failure with reduced ejection fraction) ([place])    Coronary artery disease    Aortic stenosis     Vedia Pereyra MPT 08/26/2020, 1:43 PM  [facility] [address] [place], [place], [address] Phone: [phone]   Fax:  [phone]  Name: Danielle [name] MRN: [id number] Date of Birth: 1945-08-10

## 2020-08-27 ENCOUNTER — Other Ambulatory Visit: Payer: Self-pay | Admitting: Family Medicine

## 2020-08-27 DIAGNOSIS — Z7901 Long term (current) use of anticoagulants: Secondary | ICD-10-CM

## 2020-08-29 ENCOUNTER — Ambulatory Visit: Payer: Medicare Other | Attending: Physical Medicine & Rehabilitation

## 2020-08-29 ENCOUNTER — Other Ambulatory Visit: Payer: Self-pay

## 2020-08-29 VITALS — HR 82

## 2020-08-29 DIAGNOSIS — M6281 Muscle weakness (generalized): Secondary | ICD-10-CM | POA: Insufficient documentation

## 2020-08-29 DIAGNOSIS — G8929 Other chronic pain: Secondary | ICD-10-CM | POA: Diagnosis not present

## 2020-08-29 DIAGNOSIS — R293 Abnormal posture: Secondary | ICD-10-CM | POA: Insufficient documentation

## 2020-08-29 DIAGNOSIS — M25561 Pain in right knee: Secondary | ICD-10-CM | POA: Insufficient documentation

## 2020-08-29 DIAGNOSIS — R2689 Other abnormalities of gait and mobility: Secondary | ICD-10-CM | POA: Insufficient documentation

## 2020-08-29 DIAGNOSIS — R262 Difficulty in walking, not elsewhere classified: Secondary | ICD-10-CM | POA: Insufficient documentation

## 2020-08-29 DIAGNOSIS — Z7409 Other reduced mobility: Secondary | ICD-10-CM | POA: Diagnosis not present

## 2020-08-29 DIAGNOSIS — M25562 Pain in left knee: Secondary | ICD-10-CM | POA: Diagnosis not present

## 2020-08-29 DIAGNOSIS — M545 Low back pain, unspecified: Secondary | ICD-10-CM | POA: Insufficient documentation

## 2020-08-29 NOTE — Therapy (Signed)
[facility] [place], [place], [address] Phone: [phone]   Fax:  [phone]  Physical Therapy Treatment  Patient Details  Name: Danielle [name] MRN: [id number] Date of Birth: 07-17-1945 Referring Provider (PT): Gar Ponto, MD   Encounter Date: 08/29/2020   PT End of Session - 08/29/20 0800     Visit Number 3    Number of Visits 24    Authorization Type [place] Time Period 6th visit FOTO - 10th visit FOTO/reval    Progress Note Due on Visit 10    PT Start Time 0800    PT Stop Time 0843    PT Time Calculation (min) 43 min    Activity Tolerance Patient tolerated treatment well;Patient limited by fatigue    Behavior During Therapy [facility] for tasks assessed/performed             Past Medical History:  Diagnosis Date   Allergy    Anxiety    Aortic stenosis    Status post bioprosthetic AVR   Arthritis    DJD   Asthma    Breast cancer ([place]) 12/13/2016   Left breast   Chronic systolic CHF (congestive heart failure) ([place])    EF 30% 04/2014   COPD (chronic obstructive pulmonary disease) ([place])    Coronary artery disease    per pt, had LHC prior to AVR in [place] 2016 that did not show any blockages; no stents/bypass   Gastritis    Hyperlipidemia    Hypertension    Hyperthyroidism    Multiple thyroid nodules    Osteoporosis    Paroxysmal atrial fibrillation ([place])    Pelvis fracture ([place]) 08/19/2015   MULTIPLE    Personal history of radiation therapy 2018   Spontaneous pneumothorax 11/28/2015   left    Stroke ([place]) 2000   rt hand weak    Past Surgical History:  Procedure Laterality Date   BREAST LUMPECTOMY Left 12/2016   BREAST LUMPECTOMY WITH RADIOACTIVE SEED AND SENTINEL LYMPH NODE BIOPSY Left 01/14/2017   Procedure: LEFT BREAST LUMPECTOMY WITH RADIOACTIVE SEED AND SENTINEL LYMPH NODE BIOPSY;  Surgeon: Rolm Bookbinder, MD;  Location: [facility];  Service:  General;  Laterality: Left;   CARDIAC CATHETERIZATION     CHEST TUBE INSERTION  10/2015   INTRAMEDULLARY (IM) NAIL INTERTROCHANTERIC Left 12/10/2015   Procedure: INTRAMEDULLARY (IM) NAIL INTERTROCHANTRIC;  Surgeon: Leandrew Koyanagi, MD;  Location: [facility];  Service: Orthopedics;  Laterality: Left;   PLEURADESIS Left 12/03/2015   Procedure: PLEURADESIS;  Surgeon: Melrose Nakayama, MD;  Location: [facility];  Service: Thoracic;  Laterality: Left;   RESECTION OF APICAL BLEB Left 12/03/2015   Procedure: BLEBECTOMY;  Surgeon: Melrose Nakayama, MD;  Location: [facility];  Service: Thoracic;  Laterality: Left;   RIGHT/LEFT HEART CATH AND CORONARY ANGIOGRAPHY N/A 03/10/2020   Procedure: RIGHT/LEFT HEART CATH AND CORONARY ANGIOGRAPHY;  Surgeon: [name], MD;  Location: [facility];  Service: Cardiovascular;  Laterality: N/A;   THORACOSCOPY  12/03/2015   VALVE REPLACEMENT     VIDEO ASSISTED THORACOSCOPY Left 12/03/2015   Procedure: VIDEO ASSISTED THORACOSCOPY;  Surgeon: Melrose Nakayama, MD;  Location: [facility];  Service: Thoracic;  Laterality: Left;    Vitals:   08/29/20 0809  Pulse: 82  SpO2: 98%     Subjective Assessment - 08/29/20 0809     Subjective Pt reports her aquatics session went well and was able to ex more easily.  She notes being afraid of water, but became more comfortable with being in water during the session.    Pertinent History OP, COPD, pneumothorax, arthritis, Obesity, Cornary Artery disease, breast cancer    Limitations Lifting;Standing;Walking;House hold activities    How long can you sit comfortably? 30-40 minutes,  Difficulty with standing    How long can you stand comfortably? 20 minutes with holding onto walker    How long can you walk comfortably? 200 feet with 4 wheeled walker    Patient Stated Goals To be able to walk with a cane    Currently in Pain? Yes    Pain Score 0-No pain   only with prolonged walking   Pain Location Back    Pain Orientation  Right;Left;Posterior    Pain Descriptors / Indicators Aching    Pain Type Chronic pain    Pain Onset More than a month ago    Pain Frequency Intermittent    Multiple Pain Sites Yes    Pain Score 7    Pain Location Knee    Pain Orientation Right;Left    Pain Descriptors / Indicators Aching    Pain Type Chronic pain    Pain Onset More than a month ago    Pain Frequency Intermittent    Pain Relieving Factors Rest                               [facility] Adult PT Treatment/Exercise - 08/29/20 0001       Ambulation/Gait   Ambulation/Gait Assistance 5: Supervision   due to SOB, unsteady   Ambulation/Gait Assistance Details SOB    Ambulation Distance (Feet) 60 Feet    Assistive device 4-wheeled walker    Gait Pattern Step-through pattern;Decreased step length - left;Decreased step length - right;Poor foot clearance - left    Ambulation Surface Level      Lumbar Exercises: Supine   Pelvic Tilt 2 reps;10 reps    Pelvic Tilt Limitations smal reaches up thigh to engage core    Bridge 10 reps;2 seconds   2 sets   Bridge Limitations not able to clear hips    Straight Leg Raise 10 reps;2 seconds      Knee/Hip Exercises: Seated   Long Arc Quad Right;Left;2 sets;20 reps    Long Arc Quad Limitations alt legs    Clamshell with Marga Hoots   20x2   Other Seated Knee/Hip Exercises ankle DF/PF 20x2    Marching Right;Left;2 sets;20 reps    Marching Limitations alt legs                    PT Education - 08/29/20 1356     Education Details Update HEP    Person(s) Educated Patient    Methods Explanation;Demonstration;Tactile cues;Verbal cues;Handout    Comprehension Verbalized understanding;Returned demonstration;Verbal cues required;Tactile cues required              PT Short Term Goals - 08/19/20 1123       PT SHORT TERM GOAL #1   Title The patient will be independent in an initial HEP of sitting exercises    Target Date 09/09/20                PT Long Term Goals - 08/19/20 1124       PT LONG TERM GOAL #1   Title The patient will be able to transfer sit to stand x 5 reps without use  of hands in < or equal to 18 seconds.    Baseline 30 seconds with use of hands    Time 12    Period Weeks    Target Date 11/11/20      PT LONG TERM GOAL #2   Title The patient will be able to ambulate x 200 without SOB and use of FWW for community ambulation.    Baseline 60 feet with SOB and FWW    Time 12    Period Weeks    Target Date 11/11/20      PT LONG TERM GOAL #3   Title The patient will improve FOTO outcome score to 46% for improvement of functional home activities.    Baseline 30%    Time 12    Period Weeks    Target Date 11/11/20      PT LONG TERM GOAL #4   Title The patient will be able to stand for 20 minutes with low back pain under or equal to 3/10 and use of hands for support    Baseline unable to stand without use of hands and pain 7/10    Time 12    Period Weeks    Target Date 11/11/20                   Plan - 08/29/20 1357     Clinical Impression Statement With ambulation with rollator for 28ft pt was SOB and c/o R knee pain. O2 sat was 94% and HR 98. Pt also became SOB during therex in both sitting and supine. Pt was instructed on pacing and not holdiing her breath with the completion of her exs. With cueing, the pt managed both better. Pt also participated in PT for LE and core strengthening. Due to weakness, pt was not able to clear her hips with bridging ex. Pt is deconditioned and weak and improvement is expected to occur at a slow rate.    Personal Factors and Comorbidities Comorbidity 3+;Fitness    Comorbidities OP, COPD, Coronary artery disease, obesity, arthritis    Examination-Activity Limitations Bathing;Locomotion Level;Transfers;Bed Mobility;Caring for Others;Carry;Squat;Dressing;Stairs;Stand;Toileting;Lift;Sleep    Examination-Participation Restrictions Church;Cleaning;Meal Prep;Community  Activity;Laundry;Shop;Volunteer;Yard Work    Merchant navy officer Evolving/Moderate complexity    Clinical Decision Making Moderate    Rehab Potential Good    PT Frequency 2x / week    PT Duration 12 weeks    PT Treatment/Interventions Aquatic Therapy;ADLs/Self Care Home Management;Moist Heat;Cryotherapy;Gait training;Stair training;Functional mobility training;Therapeutic activities;Therapeutic exercise;Balance training;Neuromuscular re-education;Manual techniques;Orthotic Fit/Training;Patient/family education;Dry needling;Passive range of motion;Energy conservation    PT Next Visit Plan reveiw HEP, assess balance, lower extremity/core strengthening, gait training.    PT Home Exercise Plan Access Code: [email or web address]    Consulted and Agree with Plan of Care Patient;Family member/caregiver    Family Member Consulted husband             Patient will benefit from skilled therapeutic intervention in order to improve the following deficits and impairments:  Abnormal gait, Difficulty walking, Cardiopulmonary status limiting activity, Decreased endurance, Obesity, Decreased activity tolerance, Decreased knowledge of precautions, Decreased skin integrity, Decreased balance, Pain, Decreased mobility, Decreased strength  Visit Diagnosis: Difficulty in walking, not elsewhere classified  Decreased functional mobility and endurance  Muscle weakness (generalized)  Chronic bilateral low back pain without sciatica  Chronic pain of left knee  Chronic pain of right knee  Abnormal posture  Peripheral musculoskeletal gait disorder     Problem List Patient Active Problem List   Diagnosis Date Noted  Gait abnormality 07/31/2020   Paresthesia 07/31/2020   Low back pain with bilateral sciatica 07/31/2020   Pneumonia due to COVID-19 virus    Bacteremia due to Gram-positive bacteria 08/29/2018   Abscess    Cellulitis of left breast 08/28/2018   Cellulitis of left abdominal wall  08/28/2018   Encounter for routine gynecological examination 04/28/2018   CKD (chronic kidney disease), stage III ([place]) 09/27/2017   Recurrent genital herpes 04/13/2017   Chronic pain disorder 01/25/2017   Malignant neoplasm of upper-outer quadrant of left breast in female, estrogen receptor positive ([place]) 12/16/2016   Chronic knee pain 12/07/2016   Chronic bilateral low back pain without sciatica 12/07/2016   Peripheral musculoskeletal gait disorder 12/07/2016   Encounter for therapeutic drug monitoring 06/16/2016   Generalized osteoarthritis of multiple sites 05/04/2016   Chronic anticoagulation 05/04/2016   Stroke ([place]) 04/14/2016   Aortic valve disease 04/10/2016   S/P AVR 04/10/2016   NICM (nonischemic cardiomyopathy) ([place]) 04/10/2016   Upper airway cough syndrome 03/14/2016   Morbid obesity ([place]) 03/14/2016   COPD (chronic obstructive pulmonary disease) ([place]) 03/11/2016   Thrombocytopenia ([place]) 12/12/2015   Anemia 12/12/2015   Vitamin D deficiency 12/12/2015   Acute urinary retention 12/11/2015   Osteoporosis 12/11/2015   Hip fracture ([place]) 12/10/2015   Aortic atherosclerosis ([place]) 12/10/2015   Lung blebs ([place]) 12/03/2015   Pelvic fracture ([place]) 08/19/2015   Fall at home 08/19/2015   Essential hypertension 08/19/2015   Mixed hyperlipidemia 08/19/2015   Asthma 08/19/2015   Paroxysmal atrial fibrillation ([place]) 08/19/2015   Hyperthyroidism 08/19/2015   Chronic HFrEF (heart failure with reduced ejection fraction) ([place])    Coronary artery disease    Aortic stenosis    Gar Ponto MS, PT 08/29/20 2:14 PM   [facility] [address] [place], [place], [address] Phone: [phone]   Fax:  [phone]  Name: Arrielle Mcginn MRN: [id number] Date of Birth: 07/01/1945

## 2020-09-02 ENCOUNTER — Ambulatory Visit (HOSPITAL_BASED_OUTPATIENT_CLINIC_OR_DEPARTMENT_OTHER): Payer: Medicare Other | Attending: Neurology | Admitting: Physical Therapy

## 2020-09-02 ENCOUNTER — Encounter (HOSPITAL_BASED_OUTPATIENT_CLINIC_OR_DEPARTMENT_OTHER): Payer: Self-pay | Admitting: Physical Therapy

## 2020-09-02 ENCOUNTER — Other Ambulatory Visit: Payer: Self-pay

## 2020-09-02 DIAGNOSIS — R2689 Other abnormalities of gait and mobility: Secondary | ICD-10-CM | POA: Insufficient documentation

## 2020-09-02 DIAGNOSIS — Z7409 Other reduced mobility: Secondary | ICD-10-CM | POA: Diagnosis not present

## 2020-09-02 DIAGNOSIS — M25561 Pain in right knee: Secondary | ICD-10-CM | POA: Insufficient documentation

## 2020-09-02 DIAGNOSIS — M25562 Pain in left knee: Secondary | ICD-10-CM | POA: Insufficient documentation

## 2020-09-02 DIAGNOSIS — M6281 Muscle weakness (generalized): Secondary | ICD-10-CM | POA: Insufficient documentation

## 2020-09-02 DIAGNOSIS — R262 Difficulty in walking, not elsewhere classified: Secondary | ICD-10-CM | POA: Insufficient documentation

## 2020-09-02 DIAGNOSIS — M545 Low back pain, unspecified: Secondary | ICD-10-CM | POA: Diagnosis not present

## 2020-09-02 DIAGNOSIS — R293 Abnormal posture: Secondary | ICD-10-CM | POA: Insufficient documentation

## 2020-09-02 DIAGNOSIS — G8929 Other chronic pain: Secondary | ICD-10-CM | POA: Diagnosis not present

## 2020-09-02 NOTE — Therapy (Signed)
[facility] [address] [place], [place], [address] Phone: [phone]   Fax:  [phone]  Physical Therapy Treatment  Patient Details  Name: Danielle [name] MRN: [id number] Date of Birth: 03-06-1945 Referring Provider (PT): Gar Ponto, MD   Encounter Date: 09/02/2020   PT End of Session - 09/02/20 0901     Visit Number 4    Number of Visits 24    Authorization Type [place] Time Period 6th visit FOTO - 10th visit FOTO/reval    PT Start Time 0900    PT Stop Time 0943    PT Time Calculation (min) 43 min    Equipment Utilized During Treatment Other (comment)   Ankle cuff and waist buoys, kick board, noodle/sqoodle, nekdoodle, weights and barbells   Activity Tolerance Patient tolerated treatment well;Patient limited by fatigue    Behavior During Therapy [facility] for tasks assessed/performed             Past Medical History:  Diagnosis Date   Allergy    Anxiety    Aortic stenosis    Status post bioprosthetic AVR   Arthritis    DJD   Asthma    Breast cancer ([place]) 12/13/2016   Left breast   Chronic systolic CHF (congestive heart failure) ([place])    EF 30% 04/2014   COPD (chronic obstructive pulmonary disease) ([place])    Coronary artery disease    per pt, had LHC prior to AVR in [place] 2016 that did not show any blockages; no stents/bypass   Gastritis    Hyperlipidemia    Hypertension    Hyperthyroidism    Multiple thyroid nodules    Osteoporosis    Paroxysmal atrial fibrillation ([place])    Pelvis fracture ([place]) 08/19/2015   MULTIPLE    Personal history of radiation therapy 2018   Spontaneous pneumothorax 11/28/2015   left    Stroke ([place]) 2000   rt hand weak    Past Surgical History:  Procedure Laterality Date   BREAST LUMPECTOMY Left 12/2016   BREAST LUMPECTOMY WITH RADIOACTIVE SEED AND SENTINEL LYMPH NODE BIOPSY Left 01/14/2017   Procedure: LEFT BREAST LUMPECTOMY WITH  RADIOACTIVE SEED AND SENTINEL LYMPH NODE BIOPSY;  Surgeon: Rolm Bookbinder, MD;  Location: [facility];  Service: General;  Laterality: Left;   CARDIAC CATHETERIZATION     CHEST TUBE INSERTION  10/2015   INTRAMEDULLARY (IM) NAIL INTERTROCHANTERIC Left 12/10/2015   Procedure: INTRAMEDULLARY (IM) NAIL INTERTROCHANTRIC;  Surgeon: Leandrew Koyanagi, MD;  Location: [facility];  Service: Orthopedics;  Laterality: Left;   PLEURADESIS Left 12/03/2015   Procedure: PLEURADESIS;  Surgeon: Melrose Nakayama, MD;  Location: [facility];  Service: Thoracic;  Laterality: Left;   RESECTION OF APICAL BLEB Left 12/03/2015   Procedure: BLEBECTOMY;  Surgeon: Melrose Nakayama, MD;  Location: [facility];  Service: Thoracic;  Laterality: Left;   RIGHT/LEFT HEART CATH AND CORONARY ANGIOGRAPHY N/A 03/10/2020   Procedure: RIGHT/LEFT HEART CATH AND CORONARY ANGIOGRAPHY;  Surgeon: [name], MD;  Location: [facility];  Service: Cardiovascular;  Laterality: N/A;   THORACOSCOPY  12/03/2015   VALVE REPLACEMENT     VIDEO ASSISTED THORACOSCOPY Left 12/03/2015   Procedure: VIDEO ASSISTED THORACOSCOPY;  Surgeon: Melrose Nakayama, MD;  Location: [facility];  Service: Thoracic;  Laterality: Left;    There were no vitals filed for this visit.   Subjective Assessment - 09/02/20 1057     Subjective Im ready, not so afraid.  Patient is accompained by: Family member              Pt seen for aquatic therapy today.  Treatment took place in water 3.25-4.8 ft in depth at the [employer] pool. Temp of water was 91.  Pt entered/exited the pool via stairs step to pattern minimal assisted with bilat rail. Exiting pt sits in [facility]   Standing Pt water walks using water walker multiple widths of pool with vc and cga to sba. Forward, backward and side stepping cuing for increased step length, heel strike and toe off as well as decreased guarding.  Pt with cga-sba to turn water perturbations less frightening Pt completed toe raises 4  x 10, knee flex x 10 reps, add/abd 2 x 10 reps. Added hip extension and hip circles 2 x 10 reps.  High knee marching across pool x 2.  Sidestepping holding to pool wall. isometric tummy tucks and glut sets.  Verbal and tactile cuing tight core throughout completion of all exercises  Improvement in all areas with decreased apprehension of setting.  Pt requires buoyancy for support and to offload joints with strengthening exercises. Viscosity of the water is needed for resistance of strengthening; water current perturbations provides challenge to standing balance unsupported, requiring increased core activation.                            PT Short Term Goals - 08/19/20 1123       PT SHORT TERM GOAL #1   Title The patient will be independent in an initial HEP of sitting exercises    Target Date 09/09/20               PT Long Term Goals - 08/19/20 1124       PT LONG TERM GOAL #1   Title The patient will be able to transfer sit to stand x 5 reps without use of hands in < or equal to 18 seconds.    Baseline 30 seconds with use of hands    Time 12    Period Weeks    Target Date 11/11/20      PT LONG TERM GOAL #2   Title The patient will be able to ambulate x 200 without SOB and use of FWW for community ambulation.    Baseline 60 feet with SOB and FWW    Time 12    Period Weeks    Target Date 11/11/20      PT LONG TERM GOAL #3   Title The patient will improve FOTO outcome score to 46% for improvement of functional home activities.    Baseline 30%    Time 12    Period Weeks    Target Date 11/11/20      PT LONG TERM GOAL #4   Title The patient will be able to stand for 20 minutes with low back pain under or equal to 3/10 and use of hands for support    Baseline unable to stand without use of hands and pain 7/10    Time 12    Period Weeks    Target Date 11/11/20                   Plan - 09/02/20 0945     Clinical Impression Statement Pt  with decreased apprehension.  Was able to add standing exercies with cuing for isomentrics/tight core. Improved knee flex with water walking in all directions,  encouraged heel toe technique.  She reports decreased knee discomfort with submersion and able to wlak "straighter".    Personal Factors and Comorbidities Comorbidity 3+;Fitness    Comorbidities OP, COPD, Coronary artery disease, obesity, arthritis    Examination-Activity Limitations Bathing;Locomotion Level;Transfers;Bed Mobility;Caring for Others;Carry;Squat;Dressing;Stairs;Stand;Toileting;Lift;Sleep    Examination-Participation Restrictions Church;Cleaning;Meal Prep;Community Activity;Laundry;Shop;Volunteer;Yard Work    Clinical Decision Making Moderate    Rehab Potential Good    PT Frequency 2x / week    PT Treatment/Interventions Aquatic Therapy;ADLs/Self Care Home Management;Moist Heat;Cryotherapy;Gait training;Stair training;Functional mobility training;Therapeutic activities;Therapeutic exercise;Balance training;Neuromuscular re-education;Manual techniques;Orthotic Fit/Training;Patient/family education;Dry needling;Passive range of motion;Energy conservation    PT Home Exercise Plan Access Code: [email or web address]    Consulted and Agree with Plan of Care Patient;Family member/caregiver    Family Member Consulted husband             Patient will benefit from skilled therapeutic intervention in order to improve the following deficits and impairments:  Abnormal gait, Difficulty walking, Cardiopulmonary status limiting activity, Decreased endurance, Obesity, Decreased activity tolerance, Decreased knowledge of precautions, Decreased skin integrity, Decreased balance, Pain, Decreased mobility, Decreased strength  Visit Diagnosis: Difficulty in walking, not elsewhere classified  Decreased functional mobility and endurance  Muscle weakness (generalized)  Chronic bilateral low back pain without sciatica  Peripheral musculoskeletal gait  disorder  Abnormal posture  Chronic pain of right knee  Chronic pain of left knee     Problem List Patient Active Problem List   Diagnosis Date Noted   Gait abnormality 07/31/2020   Paresthesia 07/31/2020   Low back pain with bilateral sciatica 07/31/2020   Pneumonia due to COVID-19 virus    Bacteremia due to Gram-positive bacteria 08/29/2018   Abscess    Cellulitis of left breast 08/28/2018   Cellulitis of left abdominal wall 08/28/2018   Encounter for routine gynecological examination 04/28/2018   CKD (chronic kidney disease), stage III ([place]) 09/27/2017   Recurrent genital herpes 04/13/2017   Chronic pain disorder 01/25/2017   Malignant neoplasm of upper-outer quadrant of left breast in female, estrogen receptor positive ([place]) 12/16/2016   Chronic knee pain 12/07/2016   Chronic bilateral low back pain without sciatica 12/07/2016   Peripheral musculoskeletal gait disorder 12/07/2016   Encounter for therapeutic drug monitoring 06/16/2016   Generalized osteoarthritis of multiple sites 05/04/2016   Chronic anticoagulation 05/04/2016   Stroke ([place]) 04/14/2016   Aortic valve disease 04/10/2016   S/P AVR 04/10/2016   NICM (nonischemic cardiomyopathy) ([place]) 04/10/2016   Upper airway cough syndrome 03/14/2016   Morbid obesity ([place]) 03/14/2016   COPD (chronic obstructive pulmonary disease) ([place]) 03/11/2016   Thrombocytopenia ([place]) 12/12/2015   Anemia 12/12/2015   Vitamin D deficiency 12/12/2015   Acute urinary retention 12/11/2015   Osteoporosis 12/11/2015   Hip fracture ([place]) 12/10/2015   Aortic atherosclerosis ([place]) 12/10/2015   Lung blebs ([place]) 12/03/2015   Pelvic fracture ([place]) 08/19/2015   Fall at home 08/19/2015   Essential hypertension 08/19/2015   Mixed hyperlipidemia 08/19/2015   Asthma 08/19/2015   Paroxysmal atrial fibrillation ([place]) 08/19/2015   Hyperthyroidism 08/19/2015   Chronic HFrEF (heart failure with reduced ejection fraction) ([place])    Coronary  artery disease    Aortic stenosis     Vedia Pereyra MPT 09/02/2020, 11:08 AM  [facility] [address] [place], [place], [address] Phone: [phone]   Fax:  [phone]  Name: Danielle [name] MRN: [id number] Date of Birth: 09/19/1945

## 2020-09-03 ENCOUNTER — Encounter: Payer: Medicare Other | Admitting: Neurology

## 2020-09-05 ENCOUNTER — Telehealth (HOSPITAL_COMMUNITY): Payer: Self-pay | Admitting: Pharmacy Technician

## 2020-09-05 ENCOUNTER — Other Ambulatory Visit (HOSPITAL_COMMUNITY): Payer: Self-pay | Admitting: *Deleted

## 2020-09-05 ENCOUNTER — Encounter (HOSPITAL_COMMUNITY): Payer: Self-pay | Admitting: Cardiology

## 2020-09-05 ENCOUNTER — Ambulatory Visit (HOSPITAL_COMMUNITY)
Admission: RE | Admit: 2020-09-05 | Discharge: 2020-09-05 | Disposition: A | Discharge status: Home / Self Care | Payer: Medicare Other | Source: Ambulatory Visit | Attending: Cardiology | Admitting: Cardiology

## 2020-09-05 ENCOUNTER — Other Ambulatory Visit: Payer: Self-pay

## 2020-09-05 ENCOUNTER — Other Ambulatory Visit (HOSPITAL_COMMUNITY): Payer: Self-pay

## 2020-09-05 ENCOUNTER — Telehealth: Payer: Self-pay | Admitting: *Deleted

## 2020-09-05 VITALS — BP 124/78 | HR 72 | Wt 262.0 lb

## 2020-09-05 DIAGNOSIS — R002 Palpitations: Secondary | ICD-10-CM | POA: Insufficient documentation

## 2020-09-05 DIAGNOSIS — Z79899 Other long term (current) drug therapy: Secondary | ICD-10-CM | POA: Insufficient documentation

## 2020-09-05 DIAGNOSIS — I2510 Atherosclerotic heart disease of native coronary artery without angina pectoris: Secondary | ICD-10-CM | POA: Diagnosis not present

## 2020-09-05 DIAGNOSIS — Z87891 Personal history of nicotine dependence: Secondary | ICD-10-CM | POA: Insufficient documentation

## 2020-09-05 DIAGNOSIS — I351 Nonrheumatic aortic (valve) insufficiency: Secondary | ICD-10-CM | POA: Diagnosis not present

## 2020-09-05 DIAGNOSIS — Z7984 Long term (current) use of oral hypoglycemic drugs: Secondary | ICD-10-CM | POA: Diagnosis not present

## 2020-09-05 DIAGNOSIS — I5022 Chronic systolic (congestive) heart failure: Secondary | ICD-10-CM | POA: Diagnosis not present

## 2020-09-05 DIAGNOSIS — R0602 Shortness of breath: Secondary | ICD-10-CM | POA: Insufficient documentation

## 2020-09-05 DIAGNOSIS — I514 Myocarditis, unspecified: Secondary | ICD-10-CM

## 2020-09-05 DIAGNOSIS — Z7901 Long term (current) use of anticoagulants: Secondary | ICD-10-CM | POA: Diagnosis not present

## 2020-09-05 DIAGNOSIS — E039 Hypothyroidism, unspecified: Secondary | ICD-10-CM | POA: Insufficient documentation

## 2020-09-05 DIAGNOSIS — Z8249 Family history of ischemic heart disease and other diseases of the circulatory system: Secondary | ICD-10-CM | POA: Diagnosis not present

## 2020-09-05 DIAGNOSIS — I428 Other cardiomyopathies: Secondary | ICD-10-CM | POA: Insufficient documentation

## 2020-09-05 DIAGNOSIS — I110 Hypertensive heart disease with heart failure: Secondary | ICD-10-CM | POA: Diagnosis not present

## 2020-09-05 DIAGNOSIS — I480 Paroxysmal atrial fibrillation: Secondary | ICD-10-CM | POA: Insufficient documentation

## 2020-09-05 DIAGNOSIS — R0600 Dyspnea, unspecified: Secondary | ICD-10-CM | POA: Diagnosis not present

## 2020-09-05 DIAGNOSIS — Z953 Presence of xenogenic heart valve: Secondary | ICD-10-CM | POA: Diagnosis not present

## 2020-09-05 LAB — BASIC METABOLIC PANEL
Anion gap: 6 (ref 5–15)
BUN: 10 mg/dL (ref 8–23)
CO2: 23 mmol/L (ref 22–32)
Calcium: 9.7 mg/dL (ref 8.9–10.3)
Chloride: 108 mmol/L (ref 98–111)
Creatinine, Ser: 1.18 mg/dL — ABNORMAL HIGH (ref 0.44–1.00)
GFR, Estimated: 48 mL/min — ABNORMAL LOW (ref >60)
Glucose, Bld: 94 mg/dL (ref 70–99)
Potassium: 4.9 mmol/L (ref 3.5–5.1)
Sodium: 137 mmol/L (ref 135–145)

## 2020-09-05 LAB — BRAIN NATRIURETIC PEPTIDE: B Natriuretic Peptide: 93.4 pg/mL (ref 0.0–100.0)

## 2020-09-05 MED ORDER — DAPAGLIFLOZIN PROPANEDIOL 10 MG PO TABS: 10.0000 mg | ORAL_TABLET | Freq: Every day | ORAL | 3 refills | Status: DC

## 2020-09-05 MED ORDER — TORSEMIDE 20 MG PO TABS
20.0000 mg | ORAL_TABLET | Freq: Every day | ORAL | 3 refills | Status: DC
Start: 2020-09-05 — End: 2020-09-24

## 2020-09-05 NOTE — Progress Notes (Signed)
Pt signed AZ&ME pt assit form while in office

## 2020-09-05 NOTE — Patient Instructions (Signed)
Labs done today. We will contact you only if your labs are abnormal.  STOP taking Asprin  STOP taking Lasix  START Torsemide 20mg  (1 tablet) by mouth daily.  START Farxiga 10mg  (1 tablet) by mouth daily.  No other medication changes were made. Please continue all current medications as prescribed.  Your physician has requested that you have a cardiac MRI. Cardiac MRI uses a computer to create images of your heart as its beating, producing both still and moving pictures of your heart and major blood vessels. This has to be approved through your insurance prior to scheduling, once approved we will contact you to schedule an appointment.   Your physician recommends that you schedule a follow-up appointment in: 10 days for a lab only appointment and in 3 weeks with our APP Clinic here in our office.   If you have any questions or concerns before your next appointment please send [place] a message through [place] or call our office at [phone].    TO LEAVE A MESSAGE FOR THE NURSE SELECT OPTION 2, PLEASE LEAVE A MESSAGE INCLUDING: YOUR NAME DATE OF BIRTH CALL BACK NUMBER REASON FOR CALL**this is important as we prioritize the call backs  YOU WILL RECEIVE A CALL BACK THE SAME DAY AS LONG AS YOU CALL BEFORE 4:00 PM   Do the following things EVERYDAY: Weigh yourself in the morning before breakfast. Write it down and keep it in a log. Take your medicines as prescribed Eat low salt foods--Limit salt (sodium) to 2000 mg per day.  Stay as active as you can everyday Limit all fluids for the day to less than 2 liters   At the [facility], you and your health needs are our priority. As part of our continuing mission to provide you with exceptional heart care, we have created designated Provider Care Teams. These Care Teams include your primary Cardiologist (physician) and Advanced Practice Providers (APPs- Physician Assistants and Nurse Practitioners) who all work together to  provide you with the care you need, when you need it.   You may see any of the following providers on your designated Care Team at your next follow up: Dr Glori Bickers Dr Haynes Kerns, NP Lyda Jester, [place] Audry Riles, PharmD   Please be sure to bring in all your medications bottles to every appointment.

## 2020-09-05 NOTE — Chronic Care Management (AMB) (Signed)
  Care Management   Note  09/05/2020 Name: [name] MRN: [id number] DOB: 26-Mar-1945  Danielle [name] is a 75 y.o. year old female who is a primary care patient of [name], [name], MD and is actively engaged with the care management team. I reached out to [name] by phone today to assist with re-scheduling a follow up visit with the RN Case Manager  Follow up plan: Unsuccessful telephone outreach attempt made. A HIPAA compliant phone message was left for the patient providing contact information and requesting a return call.  The care management team will reach out to the patient again over the next 7 days.  If patient returns call to provider office, please advise to call [facility] at [phone].  [facility] Management

## 2020-09-05 NOTE — Telephone Encounter (Signed)
Advanced Heart Failure Patient Advocate Encounter  Received a message that Dr. Aundra Dubin would like to start patient on Farxiga 10mg , 1 tablet daily.  No PA is required. Test claim revealed patient's copay would be $47.00 for a 30 day supply. Advised clinic staff to provide patient with an AZ&ME patient assistance application if the copay would be too high for her.   12:30 PM Beatriz Chancellor, CPhT

## 2020-09-06 NOTE — Progress Notes (Signed)
PCP: [name], [name], MD Cardiology: Dr. Saunders Revel HF cardiology: Dr. Aundra Dubin  75 y.o. with history of aortic insufficiency s/p bioprosthetic aortic valve and CHF was referred by Dr End for evaluation of CHF.  Patient had bioprosthetic aortic valve replacement for aortic insufficiency in 1/16 at [facility] in [place], [place].  Since that time, she has had mild to moderately reduced LV systolic function, as low as 30-35% and as high at 45-50%.  Most recent echo in 9/21 showed EF 45-50%.  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  She has had paroxysmal atrial fibrillation and 2 prior CVAs.  She is on warfarin.  She has had breast cancer treated with lumpectomy and radiation, no chemotherapy.  She has history of hyperthyroidism and is on methimazole.  She has controlled HTN.    For > 1 year, she has had significant exertional dyspnea.  Currently, she is short of breath walking "20 steps."  She has 3 pillow orthopnea and bendopnea.  No chest pain.  No lightheadedness.  No palpitations, she is in NSR today by exam.  She uses a walker for balance.  She is only taking Lasix 20 mg daily.  She does report tingling in her hands and feet that could be consistent with peripheral neuropathy.   REDS clip 35%  Labs (5/22): K 4.9, creatinine 1.21  ECG (5/22, personally reviewed): NSR, 1st degree AVB, PVC   PMH: 1. Left breast cancer: Treated with lumpectomy and radiation, no chemotherapy.  2. Hyperthyroidism 3. Aortic insufficiency: Severe, found when she lived in [place].  She had bioprosthetic aortic valve replacement at [facility] in 1/16.  4. Atrial fibrillation: Paroxysmal. 5. CVA x 2 6. HTN 7. Hyperlipidemia 8. Chronic systolic CHF: Nonischemic cardiomyopathy.  - Echo (3/16): EF 45-50% - Echo (1/17): EF 30-35% - Echo (2/18): EF 35-40% - Echo (11/20): EF 35-40% - Echo (9/21): EF 45-50%, global hypokinesis, mild LVH, bioprosthetic aortic valve mean gradient 10 mmHg, normal RV -  RHC/LHC (1/22): mean RA 9, PA 35/20, mean PCWP 21, CI 3.2.  Nonobstructive CAD.  9. H/o spontaneous PTX  Social History   Socioeconomic History   Marital status: Married    Spouse name: Sherwood   Number of children: 0   Years of education: 12   Highest education level: 12th grade  Occupational History   Occupation: Retired in 2004  Tobacco Use   Smoking status: Former    Packs/day: 0.25    Years: 10.00    Pack years: 2.50    Types: Cigarettes    Quit date: 03/01/2010    Years since quitting: 10.5   Smokeless tobacco: Never  Vaping Use   Vaping Use: Never used  Substance and Sexual Activity   Alcohol use: No   Drug use: No   Sexual activity: Yes    Birth control/protection: Post-menopausal  Other Topics Concern   Not on file  Social History Narrative   Lives with husband.  Ambulated independently.   Right-handed.   No daily caffeine use.   Social Determinants of Health   Financial Resource Strain: Medium Risk   Difficulty of Paying Living Expenses: Somewhat hard  Food Insecurity: No Food Insecurity   Worried About Charity fundraiser in the Last Year: Never true   Ran Out of Food in the Last Year: Never true  Transportation Needs: No Transportation Needs   Lack of Transportation (Medical): No   Lack of Transportation (Non-Medical): No  Physical Activity: Sufficiently Active   Days  of Exercise per Week: 7 days   Minutes of Exercise per Session: 30 min  Stress: No Stress Concern Present   Feeling of Stress : Only a little  Social Connections: Moderately Integrated   Frequency of Communication with Friends and Family: More than three times a week   Frequency of Social Gatherings with Friends and Family: More than three times a week   Attends Religious Services: More than 4 times per year   Active Member of [employer] or Organizations: No   Attends Music therapist: Never   Marital Status: Married  Human resources officer Violence: Not At Risk   Fear of Current  or Ex-Partner: No   Emotionally Abused: No   Physically Abused: No   Sexually Abused: No   Family History  Problem Relation Age of Onset   Diabetes Mother    Heart attack Mother 69   Diabetes Father    Lung cancer Father    Diabetes Sister    Thyroid disease Sister    Diabetes Sister    HIV Brother    ROS: All systems reviewed and negative except as per HPI.   Current Outpatient Medications  Medication Sig Dispense Refill   albuterol (PROVENTIL HFA;VENTOLIN HFA) 108 (90 Base) MCG/ACT inhaler Inhale 2 puffs into the lungs every 6 (six) hours as needed for wheezing or shortness of breath.      alendronate (FOSAMAX) 70 MG tablet Take 1 tablet (70 mg total) by mouth once a week. Take with a full glass of water on an empty stomach. 13 tablet 3   ascorbic acid (VITAMIN C) 500 MG tablet Take 500 mg by mouth daily.     budesonide-formoterol (SYMBICORT) 160-4.5 MCG/ACT inhaler Inhale 2 puffs into the lungs daily as needed.     carvedilol (COREG) 25 MG tablet Take 25 mg by mouth 2 (two) times daily with a meal.     Cholecalciferol 25 MCG (1000 UT) tablet Take 1,000 Units by mouth daily.     diclofenac sodium (VOLTAREN) 1 % GEL Apply 4 g topically 4 (four) times daily. 500 g 3   diphenhydramine-acetaminophen (TYLENOL PM) 25-500 MG TABS tablet Take 2 tablets by mouth at bedtime.     DULoxetine (CYMBALTA) 60 MG capsule Take 1 capsule (60 mg total) by mouth daily. 30 capsule 12   fluticasone (FLONASE) 50 MCG/ACT nasal spray Place 1 spray into both nostrils daily. 16 g 3   methimazole (TAPAZOLE) 5 MG tablet Take 3 tablets (15 mg total) by mouth daily. 270 tablet 2   pantoprazole (PROTONIX) 40 MG tablet Take 1 tablet (40 mg total) by mouth daily. 30 tablet 0   rosuvastatin (CRESTOR) 40 MG tablet TAKE 1 TABLET BY MOUTH  DAILY 90 tablet 3   sacubitril-valsartan (ENTRESTO) 49-51 MG Take 1 tablet by mouth 2 (two) times daily.     senna-docusate (SENOKOT-S) 8.6-50 MG tablet Take 2 tablets at bedtime by  mouth.      tamoxifen (NOLVADEX) 20 MG tablet Take 1 tablet (20 mg total) by mouth daily. 90 tablet 3   torsemide (DEMADEX) 20 MG tablet Take 1 tablet (20 mg total) by mouth daily. 90 tablet 3   traMADol (ULTRAM) 50 MG tablet Take 1 tablet (50 mg total) by mouth 3 (three) times daily. 90 tablet 5   trolamine salicylate (ASPERCREME) 10 % cream Apply 1 application topically as needed for muscle pain.     valACYclovir (VALTREX) 500 MG tablet Take 500 mg by mouth 2 (two) times daily  as needed.     warfarin (COUMADIN) 3 MG tablet Take 1 tablet daily or Take as directed by anticoagulation clinic 90 tablet 1   dapagliflozin propanediol (FARXIGA) 10 MG TABS tablet Take 1 tablet (10 mg total) by mouth daily before breakfast. 30 tablet 3   No current facility-administered medications for this encounter.   BP 124/78   Pulse 72   Wt 118.8 kg (262 lb)   SpO2 99%   BMI 41.04 kg/m  General: NAD Neck: JVP 10 cm, no thyromegaly or thyroid nodule.  Lungs: Clear to auscultation bilaterally with normal respiratory effort. CV: Nondisplaced PMI.  Heart regular S1/S2, no S3/S4, no murmur.  1+ edema to knees.  No carotid bruit.  Normal pedal pulses.  Abdomen: Soft, nontender, no hepatosplenomegaly, no distention.  Skin: Intact without lesions or rashes.  Neurologic: Alert and oriented x 3.  Psych: Normal affect. Extremities: No clubbing or cyanosis.  HEENT: Normal.   Assessment/Plan: 1. Chronic HF with mid range EF: Nonischemic cardiomyopathy.  Echo in 9/21 showed EF 45-50%, global hypokinesis, mild LVH, bioprosthetic aortic valve mean gradient 10 mmHg, normal RV.  Cause of cardiomyopathy is uncertain, mild CAD on 1/22 cath.  It is possible that this is chronic effect of long-standing AI that was not improved with valve replacement.  Also consider long-standing HTN or possibly cardiac amyloidosis (she has LVH and symptoms of peripheral neuropathy).  Her cardiac symptoms are quite impressive, NYHA class IIIb.   She seems significantly limited though some of this may be from deconditioning. On exam, she is volume overloaded.  REDS clip suggests more mild volume overload (35%).   - I will arrange for cardiac MRI to assess for evidence for cardiac amyloidosis, myocarditis, etc.  - Stop Lasix, start torsemide 20 mg daily.  BMET today and in 10 days.  - Start Farxiga 10 mg daily.  - Continue Coreg 25 mg bid.  - Continue Entresto 49/51 bid.  - If K remains stable, can eventually add spironolactone.  - Would benefit from exercise regimen, ?cardiac rehab (but not sure this will be covered without lower EF).  2. Aortic valve disorders: Per report, had severe AI.  Now s/p bioprosthetic aortic valve replacement.  The valve was stable on 9/21 echo.  - Will need antibiotic prophylaxis with dental work.  3. HTN: BP controlled on current regimen.  4. Atrial fibrillation: Paroxysmal.  She is in NSR today, rare palpitations.   - She can stop ASA given warfarin use.  - Continue warfarin for now, can consider DOAC in future.  Current data suggests DOAC is reasonable in setting of bioprosthetic valve.   Followup in 3 wks with NP/PA to reassess volume status.   [name] 09/06/2020

## 2020-09-08 NOTE — Chronic Care Management (AMB) (Signed)
  Care Management   Note  09/08/2020 Name: Markie Heffernan MRN: [id number] DOB: [birth year]  Huong Luthi is a 75 y.o. year old female who is a primary care patient of [name], [name], MD and is actively engaged with the care management team. I reached out to [name] by phone today to assist with re-scheduling a follow up visit with the RN Case Manager  Follow up plan: A second unsuccessful telephone outreach attempt made. A HIPAA compliant phone message was left for the patient providing contact information and requesting a return call.  The care management team will reach out to the patient again over the next 7 days.  If patient returns call to provider office, please advise to call [facility] at [phone].  [facility] Management

## 2020-09-09 ENCOUNTER — Ambulatory Visit: Payer: Medicare Other | Admitting: Physical Therapy

## 2020-09-09 ENCOUNTER — Telehealth: Payer: Self-pay | Admitting: Pharmacist

## 2020-09-09 NOTE — Chronic Care Management (AMB) (Signed)
Date- Patient called to remind of appointment with Watt Climes on 07.13.2022 1:00 pm  Patient aware of appointment date, time, and type of appointment (either telephone or in person). Patient aware to have/bring all medications, supplements, blood pressure and/or blood sugar logs to visit.  Questions: Have you had any recent office visit or specialist visit outside of [place]? No Are there any concerns you would like to discuss during your office visit? No Are you having any problems obtaining your medications? (Whether it pharmacy issues or cost) No   Star Rating Drug: Medication Dispensed Quantity Pharmacy  Rosuvastatin 40 mg 04.25.2022 [facility]    Any gaps in medications fill history? No    Maia Breslow, [place] Pharmacist Assistant [phone]

## 2020-09-10 ENCOUNTER — Ambulatory Visit (INDEPENDENT_AMBULATORY_CARE_PROVIDER_SITE_OTHER): Payer: Medicare Other | Admitting: Pharmacist

## 2020-09-10 DIAGNOSIS — I5022 Chronic systolic (congestive) heart failure: Secondary | ICD-10-CM | POA: Diagnosis not present

## 2020-09-10 DIAGNOSIS — J449 Chronic obstructive pulmonary disease, unspecified: Secondary | ICD-10-CM

## 2020-09-10 DIAGNOSIS — E782 Mixed hyperlipidemia: Secondary | ICD-10-CM

## 2020-09-10 DIAGNOSIS — I10 Essential (primary) hypertension: Secondary | ICD-10-CM | POA: Diagnosis not present

## 2020-09-10 DIAGNOSIS — I480 Paroxysmal atrial fibrillation: Secondary | ICD-10-CM

## 2020-09-10 NOTE — Progress Notes (Signed)
Chronic Care Management Pharmacy Note  09/26/2020 Name:  Danielle [name] MRN:  [id number] DOB:  05-04-1945  Summary: BP at goal < 140/90 per home readings Pt is having some trouble affording some of her medications  Recommendations/Changes made from today's visit: -Worked on patient assistance for Symbicort -Recommended continued BP monitoring at home and daily weights  Plan: Mail out PAP for Symbicort Follow up in 4 months  Subjective: Danielle [name] is an 75 y.o. year old female who is a primary patient of [name], [name], MD.  The CCM team was consulted for assistance with disease management and care coordination needs.    Engaged with patient by telephone for follow up visit in response to provider referral for pharmacy case management and/or care coordination services.   Consent to Services:  The patient was given information about Chronic Care Management services, agreed to services, and gave verbal consent prior to initiation of services.  Please see initial visit note for detailed documentation.   Patient Care Team: [name], Danielle G, MD as PCP - General (Family Medicine) End, Harrell Gave, MD as PCP - Cardiology (Cardiology) [name], MD as Consulting Physician (Hematology) Kyung Rudd, MD as Consulting Physician (Radiation Oncology) Rolm Bookbinder, MD as Consulting Physician (General Surgery) [name], Charlestine Massed, NP as Nurse Practitioner (Hematology and Oncology) Dimitri Ped, RN as Case Manager Viona Gilmore, [facility] as Pharmacist (Pharmacist) Alla Feeling, NP as Nurse Practitioner (Nurse Practitioner)  Recent office visits: 08/25/20 Danielle Sprague, RN: Patient presented for anti-coag visit. INR 3.9, goal 2-3. Recommended holding dose x 1. Decreased to 3 mg daily except 1.5 mg on Mon, Wed and Fri. Recheck in 3-4 weeks.  07/07/20 Danielle [name] (Family Medicine) - Seen for CCM services for grief counseling for COPD and  other chronic conditions. No medication changes.   07/02/20 Danielle [name] (Family Medicine) - Seen for CCM services for mental health counseling and other chronic conditions. no medication changes.   07/02/20 Danielle [name] MD ( PCP) - seen for postmenopausal vaginal bleeding and other chronic conditions. Discontinued atorvastatin 48m. Referral placed to gynecology. Follow up in 6 months.   06/24/20 Daniel[facility]RN [facility] -  Seen for CCM services related to chronic pain disorder and other chronic conditions. No medication changes. Follow up already scheduled in chart.  Recent consult visits: 09/05/20 Danielle Champagne MD (cardiology): Patient presented for CHF visit. D/c'd furosemide and switched to torsemide. Prescribed Farxiga 10 mg daily. D/c'd aspirin.   09/02/20 Danielle [name] PT (rehab): Patient presented for PT session.  08/20/20 Danielle Poisson MD (OBGYN): Patient presented for initial visit for postmenopausal bleeding. Completed PAP smear.  08/13/20 Danielle [name] RD (diabetes and nutrition): Patient presented for CKD nutrition counseling.  07/31/20 Danielle Pacas MD (neurology): Patient presented for initial visit for gait abnormality. Prescribed duloxetine 60 mg daily. Placed order for PT.  07/24/20 Danielle Merle MD (oncology): Patient presented for breast cancer follow up.   07/23/20 Danielle Bush MD (cardiology): Patient presented for heart failure follow up. Referred to HF clinic. Arranged for PFTs and bronchodilator challenge.  06/17/20 Danielle PennaMD ( Physical Medicine) -  Presented to clinic for spondylosis without myelopathy or radiculopathy and other issues. No medication changes. Referral for physical therapy placed. Follow up in 3 months.  05/23/20 Danielle Bush MD (cardiology): Patient presented for heart failure follow up. She cut her furosemide down to 20 mg daily because she felt like 40 mg daily was drying her out too much.  05/21/20 Danielle Clayman, NP  (oncology): Patient presented for breast cancer follow up. Plan for repeat mammogram and DEXA in Nov 2022. Plan for 5-10 years on anastrazole (started 2019).  05/16/20 Danielle Penna, MD (phys med & rehab): Patient presented for follow up. Performed radio frequency procedure.  03/19/20 Danielle Bush, MD (cardiology): Patient presented for heart failure follow up. No medication changes made.  02/28/20 Patient presented for PT session.  Hospital visits: 03/10/20 Patient admitted for heart cath.  10/20-10/23/21 Patient admitted for COVID infection.   Objective:  Lab Results  Component Value Date   CREATININE 1.60 (H) 09/17/2020   BUN 15 09/17/2020   GFR 57.35 (L) 01/04/2018   GFRNONAA 34 (L) 09/17/2020   GFRAA 57 (L) 02/27/2020   NA 138 09/17/2020   K 4.0 09/17/2020   CALCIUM 9.6 09/17/2020   CO2 27 09/17/2020   GLUCOSE 119 (H) 09/17/2020    Lab Results  Component Value Date/Time   HGBA1C 5.3 07/31/2020 10:48 AM   HGBA1C 5.0 08/28/2018 05:25 PM   GFR 57.35 (L) 01/04/2018 12:32 PM   GFR 58.52 (L) 09/28/2017 08:06 AM   MICROALBUR 1.0 07/07/2020 09:41 AM   MICROALBUR 1.9 01/04/2018 12:32 PM    Last diabetic Eye exam: No results found for: HMDIABEYEEXA  Last diabetic Foot exam: No results found for: HMDIABFOOTEX   Lab Results  Component Value Date   CHOL 162 07/02/2020   HDL 73.70 07/02/2020   LDLCALC 71 07/02/2020   TRIG 90.0 07/02/2020   CHOLHDL 2 07/02/2020    Hepatic Function Latest Ref Rng & Units 07/24/2020 05/21/2020 12/31/2019  Total Protein 6.5 - 8.1 g/dL 6.7 7.5 6.4  Albumin 3.5 - 5.0 g/dL 3.3(L) 3.7 -  AST 15 - 41 U/L 9(L) 13(L) 18  ALT 0 - 44 U/L <_0 Alk Phosphatase 38 - 126 U/L 47 52 -  Total Bilirubin 0.3 - 1.2 mg/dL 0.3 0.5 0.5    Lab Results  Component Value Date/Time   TSH 0.57 07/02/2020 10:29 AM   TSH 2.14 01/30/2020 10:54 AM   FREET4 0.91 01/29/2019 12:32 PM   FREET4 0.80 06/14/2017 03:24 PM    CBC Latest Ref Rng & Units 07/24/2020  05/21/2020 03/10/2020  WBC 4.0 - 10.5 K/uL 5.1 5.3 -  Hemoglobin 12.0 - 15.0 g/dL 11.1(L) 12.4 11.6(L)  Hematocrit 36.0 - 46.0 % 34.1(L) 38.2 34.0(L)  Platelets 150 - 400 K/uL 86(L) 95(L) -    Lab Results  Component Value Date/Time   VD25OH 39.35 07/02/2020 10:29 AM   VD25OH 39.12 01/29/2019 12:32 PM    Clinical ASCVD: Yes  The ASCVD Risk score Mikey Bussing DC Jr., et al., 2013) failed to calculate for the following reasons:   The patient has a prior MI or stroke diagnosis    Depression screen [facility] 2/9 09/19/2020 09/16/2020 08/13/2020  Decreased Interest 0 0 0  Down, Depressed, Hopeless 0 0 0  PHQ - 2 Score 0 0 0  Some recent data might be hidden     CHA2DS2/VAS Stroke Risk Points  Current as of 9 minutes ago     7 >= 2 Points: High Risk  1 - 1.99 Points: Medium Risk  0 Points: Low Risk    Last Change: N/A      Details    This score determines the patient's risk of having a stroke if the  patient has atrial fibrillation.       Points Metrics  1 Has Congestive Heart Failure:  Yes  Current as of 9 minutes ago  1 Has Vascular Disease:  Yes    Current as of 9 minutes ago  1 Has Hypertension:  Yes    Current as of 9 minutes ago  1 Age:  22    Current as of 9 minutes ago  0 Has Diabetes:  No    Current as of 9 minutes ago  2 Had Stroke:  Yes  Had TIA:  No  Had Thromboembolism:  No    Current as of 9 minutes ago  1 Female:  Yes    Current as of 9 minutes ago      Social History   Tobacco Use  Smoking Status Former   Packs/day: 0.25   Years: 10.00   Pack years: 2.50   Types: Cigarettes   Quit date: 03/01/2010   Years since quitting: 10.5  Smokeless Tobacco Never   BP Readings from Last 3 Encounters:  09/16/20 134/87  09/16/20 (!) 144/84  09/05/20 124/78   Pulse Readings from Last 3 Encounters:  09/16/20 87  09/16/20 87  09/05/20 72   Wt Readings from Last 3 Encounters:  09/16/20 245 lb (111.1 kg)  09/05/20 262 lb (118.8 kg)  07/31/20 250 lb (113.4 kg)   BMI  Readings from Last 3 Encounters:  09/16/20 38.37 kg/m  09/05/20 41.04 kg/m  07/31/20 39.16 kg/m    Assessment/Interventions: Review of patient past medical history, allergies, medications, health status, including review of consultants reports, laboratory and other test data, was performed as part of comprehensive evaluation and provision of chronic care management services.   SDOH:  (Social Determinants of Health) assessments and interventions performed: No  SDOH Screenings   Alcohol Screen: Low Risk    Last Alcohol Screening Score (AUDIT): 0  Depression (PHQ2-9): Low Risk    PHQ-2 Score: 0  Financial Resource Strain: Medium Risk   Difficulty of Paying Living Expenses: Somewhat hard  Food Insecurity: No Food Insecurity   Worried About Charity fundraiser in the Last Year: Never true   Ran Out of Food in the Last Year: Never true  Housing: Low Risk    Last Housing Risk Score: 0  Physical Activity: Sufficiently Active   Days of Exercise per Week: 7 days   Minutes of Exercise per Session: 30 min  Social Connections: Moderately Integrated   Frequency of Communication with Friends and Family: More than three times a week   Frequency of Social Gatherings with Friends and Family: More than three times a week   Attends Religious Services: More than 4 times per year   Active Member of [employer] or Organizations: No   Attends Archivist Meetings: Never   Marital Status: Married  Stress: No Stress Concern Present   Feeling of Stress : Only a little  Tobacco Use: Medium Risk   Smoking Tobacco Use: Former   Smokeless Tobacco Use: Never  Transportation Needs: No Data processing manager (Medical): No   Lack of Transportation (Non-Medical): No    CCM Care Plan  Allergies  Allergen Reactions   Lisinopril Cough   Tetanus Toxoid Adsorbed Swelling    Arm swelling   Other Cough    PEANUTS    Medications Reviewed Today     Reviewed by Bethena Midget,  PT (Physical Therapist) on 09/25/20 at [phone]  Med List Status: <None>   Medication Order Taking? Sig Documenting Provider Last Dose Status Informant  albuterol (PROVENTIL HFA;VENTOLIN HFA) 108 (90 Base) MCG/ACT  inhaler [id number] No Inhale 2 puffs into the lungs every 6 (six) hours as needed for wheezing or shortness of breath.  [provider] Taking Active Self  alendronate (FOSAMAX) 70 MG tablet [id number] No Take 1 tablet (70 mg total) by mouth once a week. Take with a full glass of water on an empty stomach. [name], Danielle G, MD Taking Active Self           Med Note [facility] Graylin Shiver   Wed Jul 23, 2020  9:08 AM)    ascorbic acid (VITAMIN C) 500 MG tablet [id number] No Take 500 mg by mouth daily. [provider] Taking Active   budesonide-formoterol (SYMBICORT) 160-4.5 MCG/ACT inhaler [id number] No Inhale 2 puffs into the lungs daily as needed. [provider] Taking Active   carvedilol (COREG) 25 MG tablet [id number] No Take 25 mg by mouth 2 (two) times daily with a meal. [provider] Taking Active   Cholecalciferol 25 MCG (1000 UT) tablet [id number] No Take 1,000 Units by mouth daily. [provider] Taking Active Self  dapagliflozin propanediol (FARXIGA) 10 MG TABS tablet [id number] No Take 1 tablet (10 mg total) by mouth daily before breakfast. Larey Dresser, MD Taking Active   diclofenac sodium (VOLTAREN) 1 % GEL [id number] No Apply 4 g topically 4 (four) times daily. Kirsteins, Luanna Salk, MD Taking Active   diphenhydramine-acetaminophen (TYLENOL PM) 25-500 MG TABS tablet [id number] No Take 2 tablets by mouth at bedtime. [provider] Taking Active Self  DULoxetine (CYMBALTA) 60 MG capsule [id number] No Take 1 capsule (60 mg total) by mouth daily.  Patient not taking: Reported on 09/19/2020   [name], MD Not Taking Active   fluticasone [facility]) 50 MCG/ACT nasal spray [id number] No Place 1 spray into both nostrils daily. [name],  Danielle G, MD Taking Active Self  methimazole (TAPAZOLE) 5 MG tablet [id number] No Take 3 tablets (15 mg total) by mouth daily. [name], Danielle G, MD Taking Active   pantoprazole (PROTONIX) 40 MG tablet [id number] No Take 1 tablet (40 mg total) by mouth daily. Elgergawy, Silver Huguenin, MD Taking Active Self  rosuvastatin (CRESTOR) 40 MG tablet [id number] No TAKE 1 TABLET BY MOUTH  DAILY [name], Danielle G, MD Taking Active   sacubitril-valsartan (ENTRESTO) 49-51 MG [id number] No Take 1 tablet by mouth 2 (two) times daily. [provider] Taking Active   senna-docusate (SENOKOT-S) 8.6-50 MG tablet [id number] No Take 2 tablets at bedtime by mouth.  [provider] Taking Active Self  tamoxifen (NOLVADEX) 20 MG tablet [id number] No Take 1 tablet (20 mg total) by mouth daily. [name], MD Taking Active   torsemide (DEMADEX) 20 MG tablet [id number]  Take 1 tablet (20 mg total) by mouth every other day. Larey Dresser, MD  Active   traMADol Veatrice Bourbon) 50 MG tablet [id number] No Take 1 tablet (50 mg total) by mouth 3 (three) times daily. [name], MD Taking Active            Med Note Caro Hight   Tue Sep 16, 2020 11:05 AM) Venida Jarvis date 08/27/20 #90 today #55 last dose today  trolamine salicylate (ASPERCREME) 10 % cream [id number] No Apply 1 application topically as needed for muscle pain. [provider] Taking Active Self  valACYclovir (VALTREX) 500 MG tablet [id number] No Take 500 mg by mouth 2 (two) times daily as needed. [provider] Taking Active   warfarin (COUMADIN) 3 MG tablet [id number] No Take 1 tablet daily or  Take as directed by anticoagulation clinic [name], Danielle G, MD Taking Active             Patient Active Problem List   Diagnosis Date Noted   Gait abnormality 07/31/2020   Paresthesia 07/31/2020   Low back pain with bilateral sciatica 07/31/2020   Pneumonia due to COVID-19 virus    Bacteremia due to Gram-positive bacteria 08/29/2018   Abscess     Cellulitis of left breast 08/28/2018   Cellulitis of left abdominal wall 08/28/2018   Encounter for routine gynecological examination 04/28/2018   CKD (chronic kidney disease), stage III ([place]) 09/27/2017   Recurrent genital herpes 04/13/2017   Chronic pain disorder 01/25/2017   Malignant neoplasm of upper-outer quadrant of left breast in female, estrogen receptor positive ([place]) 12/16/2016   Chronic knee pain 12/07/2016   Chronic bilateral low back pain without sciatica 12/07/2016   Peripheral musculoskeletal gait disorder 12/07/2016   Encounter for therapeutic drug monitoring 06/16/2016   Generalized osteoarthritis of multiple sites 05/04/2016   Chronic anticoagulation 05/04/2016   Stroke ([place]) 04/14/2016   Aortic valve disease 04/10/2016   S/P AVR 04/10/2016   NICM (nonischemic cardiomyopathy) ([place]) 04/10/2016   Upper airway cough syndrome 03/14/2016   Morbid obesity ([place]) 03/14/2016   COPD (chronic obstructive pulmonary disease) ([place]) 03/11/2016   Thrombocytopenia ([place]) 12/12/2015   Anemia 12/12/2015   Vitamin D deficiency 12/12/2015   Acute urinary retention 12/11/2015   Osteoporosis 12/11/2015   Hip fracture ([place]) 12/10/2015   Aortic atherosclerosis ([place]) 12/10/2015   Lung blebs ([place]) 12/03/2015   Pelvic fracture ([place]) 08/19/2015   Fall at home 08/19/2015   Essential hypertension 08/19/2015   Mixed hyperlipidemia 08/19/2015   Asthma 08/19/2015   Paroxysmal atrial fibrillation ([place]) 08/19/2015   Hyperthyroidism 08/19/2015   Chronic HFrEF (heart failure with reduced ejection fraction) ([place])    Coronary artery disease    Aortic stenosis     Immunization History  Administered Date(s) Administered   Fluad Quad(high Dose 65+) 11/20/2018   Influenza, High Dose Seasonal PF 11/24/2016, 12/08/2017   Influenza-Unspecified 12/07/2015, 11/09/2019   Moderna SARS-COV2 Booster Vaccination 02/28/2020   Moderna Sars-Covid-2 Vaccination 03/24/2019, 04/28/2019, 07/02/2020    Pneumococcal Conjugate-13 11/13/2013, 11/21/2014, 12/15/2018   Pneumococcal Polysaccharide-23 12/15/2012, 05/20/2016   Zoster Recombinat (Shingrix) 12/21/2017, 12/15/2018   Zoster, Live 03/01/2014   Patient reports some of her medications are high cost and she is working on getting assistance for them. She did inquire about Symbicort as that one is currently pretty expensive for her.  Conditions to be addressed/monitored:  Hypertension, Hyperlipidemia, Atrial Fibrillation, Heart Failure, COPD, Asthma, Chronic Kidney Disease, Osteoporosis and Breast cancer, chronic pain, insomnia, and hyperthyroidism  Conditions addressed this encounter: Heart failure, COPD, Afib  Care Plan : CCM Pharmacy Care Plan  Updates made by Viona Gilmore, [place] since 09/26/2020 12:00 AM     Problem: Problem: Hypertension, Hyperlipidemia, Atrial Fibrillation, Heart Failure, COPD, Asthma, Chronic Kidney Disease, Osteoporosis and Breast cancer, chronic pain, insomnia, and hyperthyroidism      Long-Range Goal: Patient-Specific Goal   Start Date: 06/11/2020  Expected End Date: 06/11/2021  Recent Progress: On track  Priority: High  Note:   Current Barriers:  Unable to independently monitor therapeutic efficacy Unable to achieve control of cholesterol  Suboptimal therapeutic regimen for COPD/asthma  Pharmacist Clinical Goal(s):  Patient will verbalize ability to afford treatment regimen achieve adherence to monitoring guidelines and medication adherence to achieve therapeutic efficacy achieve control of cholesterol as evidenced by next lipid panel  through collaboration with PharmD and provider.   Interventions: 1:1 collaboration with [name], Danielle G, MD regarding development and update of comprehensive plan of care as evidenced by provider attestation and co-signature Inter-disciplinary care team collaboration (see longitudinal plan of care) Comprehensive medication review performed; medication list updated in  electronic medical record  Hypertension (BP goal <140/90) -Controlled -Current treatment: Carvedilol 58m, 1 tablet twice daily -Medications previously tried: none  -Current home readings: 120/70, nothing > 140 -Current dietary habits: limits salt intake -Current exercise habits: limited in ability to exercise -Denies hypotensive/hypertensive symptoms -Educated on Importance of home blood pressure monitoring; Proper BP monitoring technique; Symptoms of hypotension and importance of maintaining adequate hydration; -Counseled to monitor BP at home weekly, document, and provide log at future appointments -Counseled on diet and exercise extensively Recommended to continue current medication  Hyperlipidemia/history of stroke: (LDL goal < 70) -Uncontrolled -Current treatment: Rosuvastatin 486m 1 tablet once daily  Aspirin 81100m1 tablet once daily  -Medications previously tried: none  -Current dietary patterns: sometimes fries food, no processed meats, no read meat and uses canolia oil -Current exercise habits: limited in ability -Educated on Cholesterol goals;  Benefits of statin for ASCVD risk reduction; Importance of limiting foods high in cholesterol; Exercise goal of 150 minutes per week; -Counseled on diet and exercise extensively Recommended to continue current medication Recommended repeat lipid panel and if LDL > 70, consider additional lipid lowering therapy  Heart Failure (Goal: manage symptoms and prevent exacerbations) -Uncontrolled -Last ejection fraction: 45-50% (Date: 11/21/19) -HF type: Systolic -NYHA Class: III (marked limitation of activity) -AHA HF Stage: C (Heart disease and symptoms present) -Current treatment: Entresto 49-51 mg 1 tablet twice daily Torsemide 20 mg 1 tablet daily Carvedilol 80m5m tablet twice daily Farxiga 10 mg, 1 tablet daily -Medications previously tried: losartan  -Current home BP/HR readings: never > 140 -Current dietary habits:  limits salt intake; drinks only 2 bottles of water and a cup of coffee daily -Current exercise habits: limited in ability; [facility] doing PT [facility] -Educated on Importance of weighing daily; if you gain more than 3 pounds in one day or 5 pounds in one week, call cardiologist Proper diuretic administration and potassium supplementation Importance of blood pressure control -Recommended to continue current medication Assessed patient finances. Patient is receiving Entresto through [employer] applied for [employer] assistance already.   Atrial Fibrillation (Goal: prevent stroke and major bleeding) -Controlled -CHADSVASC: 7 -Current treatment: Rate control: carvedilol 25 mg 1 tablet twice daily Anticoagulation: warfarin as directed by coumadin clinic  -Medications previously tried: none -Home BP and HR readings: nothing > 140  -Counseled on bleeding risk associated with warfarin and importance of self-monitoring for signs/symptoms of bleeding; avoidance of NSAIDs due to increased bleeding risk with anticoagulants; -Recommended to continue current medication Educated on the differences between Eliquis and warfarin as patient inquired.  COPD/asthma (Goal: control symptoms and prevent exacerbations) -Uncontrolled -Current treatment  Albuterol HFA inhaler, 2 puffs every six hours as needed for whezing or shortness of breath  Symbicort 160-4.55 mcg/act inhale 2 puffs twice daily - does not use or have -Medications previously tried: DuleDentistitched to Symbicort) -Gold Grade: Gold 1 (FEV1>80%) -Current COPD Classification:  B (high sx, <2 exacerbations/yr) -MMRC/CAT score: n/a -Pulmonary function testing: n/a -Exacerbations requiring treatment in last 6 months: none -Patient denies consistent use of maintenance inhaler (only using once daily) -Frequency of rescue inhaler use: regularly -Counseled on  Proper inhaler technique; Benefits  of consistent maintenance inhaler use When to use rescue inhaler Differences between maintenance and rescue inhalers -Recommended to continue current medication Assessed patient finances. Will plan to apply for PAP for Symbicort.  Osteoporosis (Goal prevent fractures) -Controlled -Last DEXA Scan: 11/29/2018   T-Score femoral neck: -2.8  T-Score total hip: n/a  T-Score lumbar spine: n/a  T-Score forearm radius: -1.7  10-year probability of major osteoporotic fracture: n/a  10-year probability of hip fracture: n/a -Patient is a candidate for pharmacologic treatment due to history of hip fracture  and T-Score < -2.5 in femoral neck -Current treatment  Alendronate 15m, 1 tablet once a week. (Saturday) - started 2017  Vitamin D 1000 units, 1 tablet once daily -Medications previously tried: none  -Recommend [phone] units of vitamin D daily. Recommend 1200 mg of calcium daily from dietary and supplemental sources. Counseled on oral bisphosphonate administration: take in the morning, 30 minutes prior to food with 6-8 oz of water. Do not lie down for at least 30 minutes after taking. Recommend weight-bearing and muscle strengthening exercises for building and maintaining bone density. -Recommended to continue current medication Educated on drug holiday after repeat bone density scan  Chronic pain (Goal: minimize symptoms) -Controlled -Current treatment  Diclofenac 1%gel, apply 4g four times daily  Aspercreme- as needed  APAP 5056m 2 tablet daily as needed for pain  Tramadol 5029m1 tablet three times daily  -Medications previously tried: none  -Recommended to continue current medication  Hyperthyroidism (Goal: TSH 0.35-4.5) -Controlled -Current treatment  Methimazole 5mg46m tablets every evening -Medications previously tried: none  -Recommended to continue current medication  Breast cancer (Goal: remission) -Controlled -Current treatment  Tamoxifen 20mg31mtablet once daily  (started 2019) -Medications previously tried: none  -Recommended to continue current medication  Insomnia (Goal: improve quality and quantity of sleep) -Not ideally controlled -Current treatment  Diphenhydramine- APAP 25-500mg,26mablet at bedtime  Melatonin 10mg, 3mblet at bedtime -Medications previously tried: none  -Counseled on avoidance of diphenhydramine or Benadryl containing products due to risk of falls. Recommended trial of extended release melatonin.  GERD (Goal: minimize symptoms) -Controlled -Current treatment  Pantoprazole 40 mg 1 tablet daily as needed -Medications previously tried: none  -Recommended to continue current medication   Health Maintenance -Vaccine gaps: none -Current therapy:  Senokot-S 8.6-50 mg 2 tablets at bedtime Valacyclovir 500 mg 1 tablet twice daily as needed for outbreaks -Educated on Cost vs benefit of each product must be carefully weighed by individual consumer -Patient is satisfied with current therapy and denies issues -Recommended to continue current medication  Patient Goals/Self-Care Activities Patient will:  - take medications as prescribed check blood pressure weekly, document, and provide at future appointments weigh daily, and contact provider if weight gain of > 3 lbs in one day or > 5 lbs in one week engage in dietary modifications by limiting fried foods  Follow Up Plan: Telephone follow up appointment with care management team member scheduled for: 4 months      Medication Assistance: None required.  Patient affirms current coverage meets needs.  Compliance/Adherence/Medication fill history: Care Gaps: Colonoscopy  Star-Rating Drugs: Rosuvastatin 40mg - 16m filled on 06/23/20 90DS at [facility]  Patient's preferred pharmacy is[facility]  [facility] [address] [place] [address] [phone] Mail Service   ([facility] [address]  [place] [address] Phone: [phone] Fax: [phone]  Uses pill box? Yes - patient uses a weekly container (AM/PM) Pt endorses 95% compliance  We discussed: Current pharmacy is preferred with insurance plan and patient is satisfied with pharmacy services Patient decided to: Continue current medication management strategy  Care Plan and [name] Decision:  Patient agrees to Care Plan and Follow-up.  Plan: Telephone follow up appointment with care management team member scheduled for:  4 months  [name], PharmD [place] Pharmacist [place] at [place] [phone]

## 2020-09-11 ENCOUNTER — Ambulatory Visit: Payer: Medicare Other

## 2020-09-11 ENCOUNTER — Other Ambulatory Visit: Payer: Self-pay

## 2020-09-11 DIAGNOSIS — Z7409 Other reduced mobility: Secondary | ICD-10-CM | POA: Diagnosis not present

## 2020-09-11 DIAGNOSIS — R293 Abnormal posture: Secondary | ICD-10-CM | POA: Diagnosis not present

## 2020-09-11 DIAGNOSIS — G8929 Other chronic pain: Secondary | ICD-10-CM

## 2020-09-11 DIAGNOSIS — M25562 Pain in left knee: Secondary | ICD-10-CM | POA: Diagnosis not present

## 2020-09-11 DIAGNOSIS — M6281 Muscle weakness (generalized): Secondary | ICD-10-CM

## 2020-09-11 DIAGNOSIS — M25561 Pain in right knee: Secondary | ICD-10-CM | POA: Diagnosis not present

## 2020-09-11 DIAGNOSIS — R262 Difficulty in walking, not elsewhere classified: Secondary | ICD-10-CM | POA: Diagnosis not present

## 2020-09-11 DIAGNOSIS — M545 Low back pain, unspecified: Secondary | ICD-10-CM | POA: Diagnosis not present

## 2020-09-11 DIAGNOSIS — R2689 Other abnormalities of gait and mobility: Secondary | ICD-10-CM | POA: Diagnosis not present

## 2020-09-11 NOTE — Therapy (Signed)
[place], [place], [address] Phone: [phone]   Fax:  [phone]  Physical Therapy Treatment  Patient Details  Name: Danielle [name] MRN: [id number] Date of Birth: 1945-12-28 Referring Provider (PT): Gar Ponto, MD   Encounter Date: 09/11/2020   PT End of Session - 09/11/20 0742     Visit Number 5    Number of Visits 24    Authorization Type [place] Time Period 6th visit FOTO - 10th visit FOTO/reval    Progress Note Due on Visit 10    PT Start Time 0716    PT Stop Time 0800    PT Time Calculation (min) 44 min    Equipment Utilized During Treatment Other (comment)   rollator   Activity Tolerance Patient tolerated treatment well;Patient limited by fatigue    Behavior During Therapy [facility] for tasks assessed/performed             Past Medical History:  Diagnosis Date   Allergy    Anxiety    Aortic stenosis    Status post bioprosthetic AVR   Arthritis    DJD   Asthma    Breast cancer ([place]) 12/13/2016   Left breast   Chronic systolic CHF (congestive heart failure) ([place])    EF 30% 04/2014   COPD (chronic obstructive pulmonary disease) ([place])    Coronary artery disease    per pt, had LHC prior to AVR in [place] 2016 that did not show any blockages; no stents/bypass   Gastritis    Hyperlipidemia    Hypertension    Hyperthyroidism    Multiple thyroid nodules    Osteoporosis    Paroxysmal atrial fibrillation ([place])    Pelvis fracture ([name]) 08/19/2015   MULTIPLE    Personal history of radiation therapy 2018   Spontaneous pneumothorax 11/28/2015   left    Stroke ([place]) 2000   rt hand weak    Past Surgical History:  Procedure Laterality Date   BREAST LUMPECTOMY Left 12/2016   BREAST LUMPECTOMY WITH RADIOACTIVE SEED AND SENTINEL LYMPH NODE BIOPSY Left 01/14/2017   Procedure: LEFT BREAST LUMPECTOMY WITH RADIOACTIVE SEED AND SENTINEL LYMPH NODE BIOPSY;   Surgeon: Rolm Bookbinder, MD;  Location: [facility];  Service: General;  Laterality: Left;   CARDIAC CATHETERIZATION     CHEST TUBE INSERTION  10/2015   INTRAMEDULLARY (IM) NAIL INTERTROCHANTERIC Left 12/10/2015   Procedure: INTRAMEDULLARY (IM) NAIL INTERTROCHANTRIC;  Surgeon: Leandrew Koyanagi, MD;  Location: [place];  Service: Orthopedics;  Laterality: Left;   PLEURADESIS Left 12/03/2015   Procedure: PLEURADESIS;  Surgeon: Melrose Nakayama, MD;  Location: [facility];  Service: Thoracic;  Laterality: Left;   RESECTION OF APICAL BLEB Left 12/03/2015   Procedure: BLEBECTOMY;  Surgeon: Melrose Nakayama, MD;  Location: [place];  Service: Thoracic;  Laterality: Left;   RIGHT/LEFT HEART CATH AND CORONARY ANGIOGRAPHY N/A 03/10/2020   Procedure: RIGHT/LEFT HEART CATH AND CORONARY ANGIOGRAPHY;  Surgeon: [name], MD;  Location: [facility];  Service: Cardiovascular;  Laterality: N/A;   THORACOSCOPY  12/03/2015   VALVE REPLACEMENT     VIDEO ASSISTED THORACOSCOPY Left 12/03/2015   Procedure: VIDEO ASSISTED THORACOSCOPY;  Surgeon: Melrose Nakayama, MD;  Location: [facility];  Service: Thoracic;  Laterality: Left;    There were no vitals filed for this visit.   Subjective Assessment - 09/11/20 0721     Subjective Pt reports her R knee has been hurting  her more since a long car trip, sitting with her knees bent for that extended time frame. Pt states she has been completing her exs. The R knee resolves with rest.    Pertinent History OP, COPD, pneumothorax, arthritis, Obesity, [place] Artery disease, breast cancer    Limitations Lifting;Standing;Walking;House hold activities    How long can you sit comfortably? 30-40 minutes,  Difficulty with standing    How long can you stand comfortably? 20 minutes with holding onto walker    Patient Stated Goals To be able to walk with a cane    Currently in Pain? Yes    Pain Score Asleep    Pain Location Back    Pain Orientation Right;Left;Posterior    Pain  Descriptors / Indicators Aching    Pain Type Chronic pain    Pain Onset More than a month ago    Pain Frequency Intermittent    Pain Score 7    Pain Location Knee    Pain Orientation Right;Left    Pain Descriptors / Indicators Aching    Pain Type Chronic pain    Pain Onset More than a month ago    Pain Frequency Intermittent    Aggravating Factors  walking, wt bearing    Pain Relieving Factors rest                               [facility] Adult PT Treatment/Exercise - 09/11/20 0001       Ambulation/Gait   Ambulation/Gait Yes    Ambulation/Gait Assistance 5: Supervision   due to SOB, unsteady   Ambulation/Gait Assistance Details DOE    Ambulation Distance (Feet) 120 Feet    Assistive device 4-wheeled walker    Gait Pattern Step-through pattern;Decreased step length - left;Decreased step length - right;Poor foot clearance - left    Ambulation Surface Level      Exercises   Exercises Lumbar;Knee/Hip      Knee/Hip Exercises: Seated   Long Arc Quad Right;Left;2 sets;20 reps    Long Arc Quad Weight 2 lbs.    Long [employer] Limitations alt legs; green Tband    Clamshell with [employer]   20x2   Other Seated Knee/Hip Exercises ankle DF/PF 20x2    Marching Right;Left;2 sets;20 reps    Marching Limitations alt legs; green Tband      Knee/Hip Exercises: Supine   Straight Leg Raises Right;Left;2 sets;10 reps    Straight Leg Raises Limitations 2 lbs      Manual Therapy   Manual Therapy Taping    Kinesiotex Create Space      Kinesiotix   Create Space 2 I strips to the patella with 50% lateral pull to reduce medial knee compartment pressure                      PT Short Term Goals - 08/19/20 1123       PT SHORT TERM GOAL #1   Title The patient will be independent in an initial HEP of sitting exercises    Target Date 09/09/20               PT Long Term Goals - 08/19/20 1124       PT LONG TERM GOAL #1   Title The patient will be  able to transfer sit to stand x 5 reps without use of hands in < or equal to 18 seconds.    Baseline  30 seconds with use of hands    Time 12    Period Weeks    Target Date 11/11/20      PT LONG TERM GOAL #2   Title The patient will be able to ambulate x 200 without SOB and use of FWW for community ambulation.    Baseline 60 feet with SOB and FWW    Time 12    Period Weeks    Target Date 11/11/20      PT LONG TERM GOAL #3   Title The patient will improve FOTO outcome score to 46% for improvement of functional home activities.    Baseline 30%    Time 12    Period Weeks    Target Date 11/11/20      PT LONG TERM GOAL #4   Title The patient will be able to stand for 20 minutes with low back pain under or equal to 3/10 and use of hands for support    Baseline unable to stand without use of hands and pain 7/10    Time 12    Period Weeks    Target Date 11/11/20                   Plan - 09/11/20 0743     Clinical Impression Statement Pt continues to experience DOE with walking distances of approx 60 ft c rollator. KT taping was completed to the R patella laterally to decrease R knee medial compartment pressure. Pt reported a decrease in R knee pain c the KT taping from 7 ot 5/10 with walking. PT was continued for lower body strengthening. Pt tolerated today's session without adverse effects.    Personal Factors and Comorbidities Comorbidity 3+;Fitness    Comorbidities OP, COPD, Coronary artery disease, obesity, arthritis    Examination-Activity Limitations Bathing;Locomotion Level;Transfers;Bed Mobility;Caring for Others;Carry;Squat;Dressing;Stairs;Stand;Toileting;Lift;Sleep    Examination-Participation Restrictions Church;Cleaning;Meal Prep;Community Activity;Laundry;Shop;Volunteer;Yard Work    Merchant [employer] officer Evolving/Moderate complexity    Clinical Decision Making Moderate    Rehab Potential Good    PT Frequency 2x / week    PT Duration 12 weeks    PT  Treatment/Interventions Aquatic Therapy;ADLs/Self Care Home Management;Moist Heat;Cryotherapy;Gait training;Stair training;Functional mobility training;Therapeutic activities;Therapeutic exercise;Balance training;Neuromuscular re-education;Manual techniques;Orthotic Fit/Training;Patient/family education;Dry needling;Passive range of motion;Energy conservation    PT Next Visit Plan reveiw HEP, assess balance, lower extremity/core strengthening, gait training.    PT Home Exercise Plan Access Code: [email or web address]    Consulted and Agree with Plan of Care Patient;Family member/caregiver             Patient will benefit from skilled therapeutic intervention in order to improve the following deficits and impairments:  Abnormal gait, Difficulty walking, Cardiopulmonary status limiting activity, Decreased endurance, Obesity, Decreased activity tolerance, Decreased knowledge of precautions, Decreased skin integrity, Decreased balance, Pain, Decreased mobility, Decreased strength  Visit Diagnosis: Difficulty in walking, not elsewhere classified  Decreased functional mobility and endurance  Muscle weakness (generalized)  Chronic bilateral low back pain without sciatica  Peripheral musculoskeletal gait disorder  Abnormal posture  Chronic pain of right knee  Chronic pain of left knee     Problem List Patient Active Problem List   Diagnosis Date Noted   Gait abnormality 07/31/2020   Paresthesia 07/31/2020   Low back pain with bilateral sciatica 07/31/2020   Pneumonia due to COVID-19 virus    Bacteremia due to Gram-positive bacteria 08/29/2018   Abscess    Cellulitis of left breast 08/28/2018   Cellulitis of left abdominal wall 08/28/2018  Encounter for routine gynecological examination 04/28/2018   CKD (chronic kidney disease), stage III ([place]) 09/27/2017   Recurrent genital herpes 04/13/2017   Chronic pain disorder 01/25/2017   Malignant neoplasm of upper-outer quadrant of left breast in  female, estrogen receptor positive ([place]) 12/16/2016   Chronic knee pain 12/07/2016   Chronic bilateral low back pain without sciatica 12/07/2016   Peripheral musculoskeletal gait disorder 12/07/2016   Encounter for therapeutic drug monitoring 06/16/2016   Generalized osteoarthritis of multiple sites 05/04/2016   Chronic anticoagulation 05/04/2016   Stroke ([place]) 04/14/2016   Aortic valve disease 04/10/2016   S/P AVR 04/10/2016   NICM (nonischemic cardiomyopathy) ([place]) 04/10/2016   Upper airway cough syndrome 03/14/2016   Morbid obesity ([place]) 03/14/2016   COPD (chronic obstructive pulmonary disease) ([place]) 03/11/2016   Thrombocytopenia ([place]) 12/12/2015   Anemia 12/12/2015   Vitamin D deficiency 12/12/2015   Acute urinary retention 12/11/2015   Osteoporosis 12/11/2015   Hip fracture ([place]) 12/10/2015   Aortic atherosclerosis ([place]) 12/10/2015   Lung blebs ([place]) 12/03/2015   Pelvic fracture ([place]) 08/19/2015   Fall at home 08/19/2015   Essential hypertension 08/19/2015   Mixed hyperlipidemia 08/19/2015   Asthma 08/19/2015   Paroxysmal atrial fibrillation ([place]) 08/19/2015   Hyperthyroidism 08/19/2015   Chronic HFrEF (heart failure with reduced ejection fraction) ([facility])    Coronary artery disease    Aortic stenosis    Gar Ponto MS, PT 09/11/20 2:13 PM   [facility] [address] [place], [place], [address] Phone: [phone]   Fax:  [phone]  Name: Danielle [name] MRN: [id number] Date of Birth: Jul 12, 1945

## 2020-09-11 NOTE — Chronic Care Management (AMB) (Signed)
  Care Management   Note  09/11/2020 Name: [name] MRN: [id number] DOB: [birth year]  [name] is a 75 y.o. year old female who is a primary care patient of [name], [name], MD and is actively engaged with the care management team. I reached out to [name] by phone today to assist with re-scheduling a follow up visit with the RN Case Manager  Follow up plan: Telephone appointment with care management team member scheduled for:09/19/20  [place] Management

## 2020-09-11 NOTE — Chronic Care Management (AMB) (Signed)
  Care Management   Note  09/11/2020 Name: Danielle [name] MRN: [id number] DOB: August 04, 1945  Danielle [name] is a 75 y.o. year old female who is a primary care patient of [name], [name], MD and is actively engaged with the care management team. I reached out to [name] by phone today to assist with re-scheduling a follow up visit with the RN Case Manager  Follow up plan: A third unsuccessful telephone outreach attempt made. A HIPAA compliant phone message was left for the patient providing contact information and requesting a return call. We have been unable to make contact with the patient for follow up. The care management team is available to follow up with the patient after provider conversation with the patient regarding recommendation for care management engagement and subsequent re-referral to the care management team. If patient returns call to provider office, please advise to call [place] at [phone].  [place] Management

## 2020-09-12 ENCOUNTER — Other Ambulatory Visit (HOSPITAL_COMMUNITY): Payer: Self-pay

## 2020-09-16 ENCOUNTER — Encounter: Payer: Medicare Other | Attending: Physical Medicine & Rehabilitation | Admitting: Physical Medicine & Rehabilitation

## 2020-09-16 ENCOUNTER — Ambulatory Visit: Payer: Medicare Other | Admitting: Physical Therapy

## 2020-09-16 ENCOUNTER — Other Ambulatory Visit: Payer: Self-pay

## 2020-09-16 ENCOUNTER — Encounter: Payer: Self-pay | Admitting: Physical Medicine & Rehabilitation

## 2020-09-16 ENCOUNTER — Encounter: Payer: Medicare Other | Admitting: Physical Therapy

## 2020-09-16 ENCOUNTER — Encounter: Payer: Self-pay | Admitting: Physical Therapy

## 2020-09-16 VITALS — BP 144/84 | HR 87

## 2020-09-16 VITALS — BP 134/87 | HR 87 | Ht 67.0 in | Wt 245.0 lb

## 2020-09-16 DIAGNOSIS — M25561 Pain in right knee: Secondary | ICD-10-CM

## 2020-09-16 DIAGNOSIS — R2689 Other abnormalities of gait and mobility: Secondary | ICD-10-CM | POA: Diagnosis not present

## 2020-09-16 DIAGNOSIS — M6281 Muscle weakness (generalized): Secondary | ICD-10-CM | POA: Diagnosis not present

## 2020-09-16 DIAGNOSIS — R262 Difficulty in walking, not elsewhere classified: Secondary | ICD-10-CM

## 2020-09-16 DIAGNOSIS — M25562 Pain in left knee: Secondary | ICD-10-CM | POA: Diagnosis not present

## 2020-09-16 DIAGNOSIS — R293 Abnormal posture: Secondary | ICD-10-CM

## 2020-09-16 DIAGNOSIS — M545 Low back pain, unspecified: Secondary | ICD-10-CM | POA: Diagnosis not present

## 2020-09-16 DIAGNOSIS — M47816 Spondylosis without myelopathy or radiculopathy, lumbar region: Secondary | ICD-10-CM | POA: Diagnosis not present

## 2020-09-16 DIAGNOSIS — Z7409 Other reduced mobility: Secondary | ICD-10-CM

## 2020-09-16 DIAGNOSIS — G8929 Other chronic pain: Secondary | ICD-10-CM | POA: Diagnosis not present

## 2020-09-16 NOTE — Progress Notes (Signed)
Subjective:    Patient ID: Danielle [name], female    DOB: Apr 17, 1945, 75 y.o.   MRN: [id number]  Encounter Date: 05/16/2020     Editor: [name], MD (Physician)              RightL5 dorsal ramus., Right L4 and Right L3 medial branch radio frequency neurotomy under fluoroscopic guidance      HPI  75 year old female with history of morbid obesity osteoporosis and lumbar spondylosis who returns with her chronic pain in the lumbar area.  Overall she feels like she is doing better with physical therapy as well as with the tramadol 50 mg 3 times daily.  She had good results with lumbar medial branch blocks L3-L4-L5 but did not have any prolonged results with the radiofrequency neurotomy of the same nerves.  Therefore she was sent to physical therapy and has been doing this on a regular basis for the last month.  Her activity tolerance has improved she has goals of ambulating with a cane rather than with a walker. Saw Dr Aundra Dubin for CHF, now on Farxiga   Pain Inventory Average Pain 6 Pain Right Now 6 My pain is intermittent  In the last 24 hours, has pain interfered with the following? General activity 4 Relation with others 4 Enjoyment of life 4 What TIME of day is your pain at its worst? varies Sleep (in general) Poor  Pain is worse with: walking, standing, and some activites Pain improves with: rest and medication Relief from Meds: 2  Family History  Problem Relation Age of Onset   Diabetes Mother    Heart attack Mother 6   Diabetes Father    Lung cancer Father    Diabetes Sister    Thyroid disease Sister    Diabetes Sister    HIV Brother    Social History   Socioeconomic History   Marital status: Married    Spouse name: [name]   Number of children: 0   Years of education: 12   Highest education level: 12th grade  Occupational History   Occupation: Retired in 2004  Tobacco Use   Smoking status: Former    Packs/day: 0.25    Years: 10.00     Pack years: 2.50    Types: Cigarettes    Quit date: 03/01/2010    Years since quitting: 10.5   Smokeless tobacco: Never  Vaping Use   Vaping Use: Never used  Substance and Sexual Activity   Alcohol use: No   Drug use: No   Sexual activity: Yes    Birth control/protection: Post-menopausal  Other Topics Concern   Not on file  Social History Narrative   Lives with husband.  Ambulated independently.   Right-handed.   No daily caffeine use.   Social Determinants of Health   Financial Resource Strain: Medium Risk   Difficulty of Paying Living Expenses: Somewhat hard  Food Insecurity: No Food Insecurity   Worried About Charity fundraiser in the Last Year: Never true   Ran Out of Food in the Last Year: Never true  Transportation Needs: No Transportation Needs   Lack of Transportation (Medical): No   Lack of Transportation (Non-Medical): No  Physical Activity: Sufficiently Active   Days of Exercise per Week: 7 days   Minutes of Exercise per Session: 30 min  Stress: No Stress Concern Present   Feeling of Stress : Only a little  Social Connections: Moderately Integrated   Frequency of Communication with Friends and Family:  More than three times a week   Frequency of Social Gatherings with Friends and Family: More than three times a week   Attends Religious Services: More than 4 times per year   Active Member of Clubs or Organizations: No   Attends Archivist Meetings: Never   Marital Status: Married   Past Surgical History:  Procedure Laterality Date   BREAST LUMPECTOMY Left 12/2016   BREAST LUMPECTOMY WITH RADIOACTIVE SEED AND SENTINEL LYMPH NODE BIOPSY Left 01/14/2017   Procedure: LEFT BREAST LUMPECTOMY WITH RADIOACTIVE SEED AND SENTINEL LYMPH NODE BIOPSY;  Surgeon: Rolm Bookbinder, MD;  Location: [place] [facility];  Service: General;  Laterality: Left;   CARDIAC CATHETERIZATION     CHEST TUBE INSERTION  10/2015   INTRAMEDULLARY (IM) NAIL INTERTROCHANTERIC Left 12/10/2015    Procedure: INTRAMEDULLARY (IM) NAIL INTERTROCHANTRIC;  Surgeon: Leandrew Koyanagi, MD;  Location: [facility];  Service: Orthopedics;  Laterality: Left;   PLEURADESIS Left 12/03/2015   Procedure: PLEURADESIS;  Surgeon: Melrose Nakayama, MD;  Location: [facility];  Service: Thoracic;  Laterality: Left;   RESECTION OF APICAL BLEB Left 12/03/2015   Procedure: BLEBECTOMY;  Surgeon: Melrose Nakayama, MD;  Location: [facility];  Service: Thoracic;  Laterality: Left;   RIGHT/LEFT HEART CATH AND CORONARY ANGIOGRAPHY N/A 03/10/2020   Procedure: RIGHT/LEFT HEART CATH AND CORONARY ANGIOGRAPHY;  Surgeon: [name], MD;  Location: [facility];  Service: Cardiovascular;  Laterality: N/A;   THORACOSCOPY  12/03/2015   VALVE REPLACEMENT     VIDEO ASSISTED THORACOSCOPY Left 12/03/2015   Procedure: VIDEO ASSISTED THORACOSCOPY;  Surgeon: Melrose Nakayama, MD;  Location: [facility];  Service: Thoracic;  Laterality: Left;   Past Surgical History:  Procedure Laterality Date   BREAST LUMPECTOMY Left 12/2016   BREAST LUMPECTOMY WITH RADIOACTIVE SEED AND SENTINEL LYMPH NODE BIOPSY Left 01/14/2017   Procedure: LEFT BREAST LUMPECTOMY WITH RADIOACTIVE SEED AND SENTINEL LYMPH NODE BIOPSY;  Surgeon: Rolm Bookbinder, MD;  Location: [facility];  Service: General;  Laterality: Left;   CARDIAC CATHETERIZATION     CHEST TUBE INSERTION  10/2015   INTRAMEDULLARY (IM) NAIL INTERTROCHANTERIC Left 12/10/2015   Procedure: INTRAMEDULLARY (IM) NAIL INTERTROCHANTRIC;  Surgeon: Leandrew Koyanagi, MD;  Location: [facility];  Service: Orthopedics;  Laterality: Left;   PLEURADESIS Left 12/03/2015   Procedure: PLEURADESIS;  Surgeon: Melrose Nakayama, MD;  Location: [facility];  Service: Thoracic;  Laterality: Left;   RESECTION OF APICAL BLEB Left 12/03/2015   Procedure: BLEBECTOMY;  Surgeon: Melrose Nakayama, MD;  Location: [facility];  Service: Thoracic;  Laterality: Left;   RIGHT/LEFT HEART CATH AND CORONARY ANGIOGRAPHY N/A 03/10/2020   Procedure:  RIGHT/LEFT HEART CATH AND CORONARY ANGIOGRAPHY;  Surgeon: [name], MD;  Location: [facility];  Service: Cardiovascular;  Laterality: N/A;   THORACOSCOPY  12/03/2015   VALVE REPLACEMENT     VIDEO ASSISTED THORACOSCOPY Left 12/03/2015   Procedure: VIDEO ASSISTED THORACOSCOPY;  Surgeon: Melrose Nakayama, MD;  Location: [facility];  Service: Thoracic;  Laterality: Left;   Past Medical History:  Diagnosis Date   Allergy    Anxiety    Aortic stenosis    Status post bioprosthetic AVR   Arthritis    DJD   Asthma    Breast cancer ([place]) 12/13/2016   Left breast   Chronic systolic CHF (congestive heart failure) ([place])    EF 30% 04/2014   COPD (chronic obstructive pulmonary disease) ([facility])    Coronary artery disease    per  pt, had LHC prior to AVR in [place] that did not show any blockages; no stents/bypass   Gastritis    Hyperlipidemia    Hypertension    Hyperthyroidism    Multiple thyroid nodules    Osteoporosis    Paroxysmal atrial fibrillation ([place])    Pelvis fracture ([place]) 08/19/2015   MULTIPLE    Personal history of radiation therapy 2018   Spontaneous pneumothorax 11/28/2015   left    Stroke ([place]) 2000   rt hand weak   BP 134/87   Pulse 87   Ht 5\' 7"  (1.702 m)   Wt 245 lb (111.1 kg)   SpO2 93%   BMI 38.37 kg/m   Opioid Risk Score:   Fall Risk Score:  `1  Depression screen PHQ 2/9  Depression screen [facility] 2/9 09/16/2020 08/13/2020 06/24/2020 06/17/2020 05/26/2020 05/16/2020 04/17/2020  Decreased Interest 0 0 0 0 0 0 1  Down, Depressed, Hopeless 0 0 0 0 1 0 1  PHQ - 2 Score 0 0 0 0 1 0 2  Some recent data might be hidden     Review of Systems  Constitutional: Negative.   HENT: Negative.    Eyes: Negative.   Respiratory: Negative.    Cardiovascular: Negative.   Gastrointestinal: Negative.   Endocrine: Negative.   Genitourinary: Negative.   Musculoskeletal:  Positive for gait problem.  Skin: Negative.   Allergic/Immunologic: Negative.    Hematological:  Bruises/bleeds easily.       Warfarin  Psychiatric/Behavioral: Negative.    All other systems reviewed and are negative.     Objective:   Physical Exam Vitals and nursing note reviewed.  Constitutional:      Appearance: She is obese.  HENT:     Head: Normocephalic and atraumatic.  Eyes:     Extraocular Movements: Extraocular movements intact.     Conjunctiva/sclera: Conjunctivae normal.     Pupils: Pupils are equal, round, and reactive to light.  Musculoskeletal:     Comments: Mildly reduced range of motion bilateral hip with an and external rotation  Neurological:     Mental Status: She is alert and oriented to person, place, and time.     Comments: Motor strength is 5/5 bilateral hip flexor knee extensor ankle dorsiflexor Negative straight leg raising bilaterally.  Psychiatric:        Mood and Affect: Mood normal.        Behavior: Behavior normal.          Assessment & Plan:   1.  Lumbar spondylosis without myelopathy.  Overall doing better with physical therapy.  We discussed that we will continue the outpatient therapy for another month.  Continue tramadol 50 mg 3 times daily.  Continue efforts at weight loss.

## 2020-09-16 NOTE — Patient Instructions (Signed)
   Voncille Lo, PT, [place] Certified Exercise Expert for the Aging Adult  09/16/20 10:05 AM Phone: [phone] Fax: [phone]

## 2020-09-16 NOTE — Therapy (Signed)
[place], [place], [address] Phone: [phone]   Fax:  [phone]  Physical Therapy Treatment  Patient Details  Name: Danielle [name] MRN: [id number] Date of Birth: 10/13/1945 Referring Provider (PT): Gar Ponto, MD   Encounter Date: 09/16/2020   PT End of Session - 09/16/20 0951     Visit Number 6    Number of Visits 24    Authorization Type [place] Time Period 6th visit FOTO - 10th visit FOTO/reval    Progress Note Due on Visit 10    PT Start Time 0847    PT Stop Time 0930    PT Time Calculation (min) 43 min    Equipment Utilized During Treatment Other (comment)    Activity Tolerance Patient tolerated treatment well;Patient limited by fatigue    Behavior During Therapy [facility] for tasks assessed/performed             Past Medical History:  Diagnosis Date   Allergy    Anxiety    Aortic stenosis    Status post bioprosthetic AVR   Arthritis    DJD   Asthma    Breast cancer ([place]) 12/13/2016   Left breast   Chronic systolic CHF (congestive heart failure) ([place])    EF 30% 04/2014   COPD (chronic obstructive pulmonary disease) ([place])    Coronary artery disease    per pt, had LHC prior to AVR in [place] 2016 that did not show any blockages; no stents/bypass   Gastritis    Hyperlipidemia    Hypertension    Hyperthyroidism    Multiple thyroid nodules    Osteoporosis    Paroxysmal atrial fibrillation ([place])    Pelvis fracture ([place]) 08/19/2015   MULTIPLE    Personal history of radiation therapy 2018   Spontaneous pneumothorax 11/28/2015   left    Stroke ([place]) 2000   rt hand weak    Past Surgical History:  Procedure Laterality Date   BREAST LUMPECTOMY Left 12/2016   BREAST LUMPECTOMY WITH RADIOACTIVE SEED AND SENTINEL LYMPH NODE BIOPSY Left 01/14/2017   Procedure: LEFT BREAST LUMPECTOMY WITH RADIOACTIVE SEED AND SENTINEL LYMPH NODE BIOPSY;  Surgeon:  Rolm Bookbinder, MD;  Location: [place];  Service: General;  Laterality: Left;   CARDIAC CATHETERIZATION     CHEST TUBE INSERTION  10/2015   INTRAMEDULLARY (IM) NAIL INTERTROCHANTERIC Left 12/10/2015   Procedure: INTRAMEDULLARY (IM) NAIL INTERTROCHANTRIC;  Surgeon: Leandrew Koyanagi, MD;  Location: [place];  Service: Orthopedics;  Laterality: Left;   PLEURADESIS Left 12/03/2015   Procedure: PLEURADESIS;  Surgeon: Melrose Nakayama, MD;  Location: [place];  Service: Thoracic;  Laterality: Left;   RESECTION OF APICAL BLEB Left 12/03/2015   Procedure: BLEBECTOMY;  Surgeon: Melrose Nakayama, MD;  Location: [place];  Service: Thoracic;  Laterality: Left;   RIGHT/LEFT HEART CATH AND CORONARY ANGIOGRAPHY N/A 03/10/2020   Procedure: RIGHT/LEFT HEART CATH AND CORONARY ANGIOGRAPHY;  Surgeon: [name], MD;  Location: [place] CV LAB;  Service: Cardiovascular;  Laterality: N/A;   THORACOSCOPY  12/03/2015   VALVE REPLACEMENT     VIDEO ASSISTED THORACOSCOPY Left 12/03/2015   Procedure: VIDEO ASSISTED THORACOSCOPY;  Surgeon: Melrose Nakayama, MD;  Location: [place];  Service: Thoracic;  Laterality: Left;    Vitals:   09/16/20 0900  BP: (!) 144/84  Pulse: 87     Subjective Assessment - 09/16/20 0945     Subjective I tried the tape  last time but it didnt work very much.  My R ight knee is about 7/10 pain.  my back is ok when I sit but when I stand my back pain is 3/10    Patient is accompained by: --   Family member in the lobby   Pertinent History OP, COPD, pneumothorax, arthritis, Obesity, Cornary Artery disease, breast cancer    Limitations Lifting;Standing;Walking;House hold activities    How long can you sit comfortably? an hour or more ,  Difficulty with standing    How long can you stand comfortably? 10-41mn on walker    How long can you walk comfortably? 200 feet with 4 wheeled walker    Patient Stated Goals To be able to walk with a cane    Currently in Pain? Yes    Pain Score 3      Pain Location Back    Pain Orientation Right;Left;Posterior    Pain Descriptors / Indicators Aching    Pain Type Chronic pain    Pain Onset More than a month ago    Pain Frequency Intermittent    Pain Score 7    Pain Location Knee    Pain Orientation Right;Posterior    Pain Descriptors / Indicators Aching;Sharp;Shooting    Pain Type Chronic pain    Pain Onset More than a month ago    Pain Frequency Intermittent                [facility] PT Assessment - 09/16/20 0001       Assessment   Medical Diagnosis R26.9 (ICD-10-CM) - Gait abnormality  R20.2 (ICD-10-CM) - Paresthesia  M54.42,M54.41 (ICD-10-CM) - Low back pain with bilateral sciatica, unspecified back pain laterality, unspecified chronicity    Referring Provider (PT) Kristeins, ALuanna Salk MD    Onset Date/Surgical Date --   2 years ago   Hand Dominance Right      Observation/Other Assessments   Focus on Therapeutic Outcomes (FOTO)  eval 30%   today40% Lumbar      AROM   Lumbar Flexion 75%    Lumbar Extension 30%    Lumbar - Right Side Bend 75%    Lumbar - Left Side Bend 75%    Lumbar - Right Rotation 75%    Lumbar - Left Rotation 75%      Strength   Right Hip Flexion 4+/5    Right Hip ABduction 4+/5    Right Hip ADduction 4+/5    Left Hip Flexion 4-/5    Left Hip ABduction 4-/5    Left Hip ADduction 4+/5    Right Knee Flexion 4+/5    Right Knee Extension 4+/5    Left Knee Flexion 4+/5    Left Knee Extension 4+/5      Ambulation/Gait   Ambulation/Gait Yes    Ambulation/Gait Assistance 5: Supervision   due to SOB, unsteady   Ambulation Distance (Feet) 120 Feet    Assistive device 4-wheeled walker    Gait Pattern Step-through pattern;Decreased step length - left;Decreased step length - right;Poor foot clearance - left                           [facility] Adult PT Treatment/Exercise - 09/16/20 0001       Exercises   Exercises Lumbar;Knee/Hip      Lumbar Exercises: Standing   Other Standing  Lumbar Exercises sit to stand at sink with chair behind 5 x 3 with 2.5 rest in between  added to HEP      Lumbar Exercises: Seated   Long Arc Quad on Chair 2 sets;10 reps    Other Seated Lumbar Exercises hip abd with blue t band 2 x 20      Knee/Hip Exercises: Seated   Clamshell with TheraBand Green    Other Seated Knee/Hip Exercises ankle DF/PF 20x2    Marching Right;Left;2 sets;20 reps    Marching Limitations alt legs; green Tband                    PT Education - 09/16/20 1006     Education Details added sit to stand at sink/counter to be performed as movement snacks during the day.    Person(s) Educated Patient    Methods Explanation;Demonstration;Tactile cues;Verbal cues;Handout    Comprehension Verbalized understanding;Returned demonstration              PT Short Term Goals - 09/16/20 0948       PT SHORT TERM GOAL #1   Title The patient will be independent in an initial HEP of sitting exercises    Baseline met    Time 3    Period Weeks    Status Achieved    Target Date 09/09/20               PT Long Term Goals - 09/16/20 0948       PT LONG TERM GOAL #1   Title The patient will be able to transfer sit to stand x 5 reps without use of hands in < or equal to 18 seconds.    Baseline 5 x STS 43.34 sec  ( risk of fall 15 sec or more) Pt must use hands    Time 12    Period Weeks    Status On-going      PT LONG TERM GOAL #2   Title The patient will be able to ambulate x 200 without SOB and use of FWW for community ambulation.    Baseline Pt with SOB walking 120 feet today    Time 12    Period Weeks    Status On-going      PT LONG TERM GOAL #3   Title The patient will improve FOTO outcome score to 46% for improvement of functional home activities.    Baseline 30% eval    Time 12    Period Weeks    Status On-going      PT LONG TERM GOAL #4   Title The patient will be able to stand for 20 minutes with low back pain under or equal to 3/10 and  use of hands for support    Baseline unable to stand without use of hands and pain 7/10    Time 12    Period Weeks    Status On-going      PT LONG TERM GOAL #5   Title Pt will gross LE MMT to 4+/5 for improved strength.    Baseline see flow chart MMT    Time 8    Period Weeks    Status On-going            Access Code: [email or web address]: [email or web address]: 07/19/2022Prepared by: Donnetta Simpers BeardsleyExercises   Sit to Stand with Counter Support - 3 x daily - 7 x weekly - 1 sets - 5-10 reps       Plan - 09/16/20 1001     Clinical Impression Statement Pt continues to experience DOE and after 120 feet had increased BP  needing rest before continuing with seated exercises.  she reports that KT tape did not help significantly and she pulled it off.  Pt is to see Dr Bonney Aid today.  she reports she is trying to find pool to continue her aquatic/water exercises. Pt added sit to stand at sink as a movement snack during the day to increase functional strength to ease her ability to rise and lower on toilet at home.  Pt fatigues aftter 5 reps so suggested preading out during the day.  Pt FOTO today showed imporvement form 30 to 40  ( goal 46%)  will continue to work with [place] with POC    Personal Factors and Comorbidities Comorbidity 3+;Fitness    Comorbidities OP, COPD, Coronary artery disease, obesity, arthritis    Examination-Activity Limitations Bathing;Locomotion Level;Transfers;Bed Mobility;Caring for Others;Carry;Squat;Dressing;Stairs;Stand;Toileting;Lift;Sleep    Examination-Participation Restrictions Church;Cleaning;Meal Prep;Community Activity;Laundry;Shop;Volunteer;Yard Work    [employer]chant [employer] officer Evolving/Moderate complexity    PT Frequency 2x / week    PT Duration 12 weeks    PT Treatment/Interventions Aquatic Therapy;ADLs/Self Care Home Management;Moist Heat;Cryotherapy;Gait training;Stair training;Functional mobility training;Therapeutic  activities;Therapeutic exercise;Balance training;Neuromuscular re-education;Manual techniques;Orthotic Fit/Training;Patient/family education;Dry needling;Passive range of motion;Energy conservation    PT Next Visit Plan reveiw HEP, assess balance, lower extremity/core strengthening, gait training.    PT Home Exercise Plan Access Code: [email or web address]    Consulted and Agree with Plan of Care Patient    Family Member Consulted husband             Patient will benefit from skilled therapeutic intervention in order to improve the following deficits and impairments:  Abnormal gait, Difficulty walking, Cardiopulmonary status limiting activity, Decreased endurance, Obesity, Decreased activity tolerance, Decreased knowledge of precautions, Decreased skin integrity, Decreased balance, Pain, Decreased mobility, Decreased strength  Visit Diagnosis: Difficulty in walking, not elsewhere classified  Decreased functional mobility and endurance  Muscle weakness (generalized)  Chronic bilateral low back pain without sciatica  Peripheral musculoskeletal gait disorder  Abnormal posture  Chronic pain of right knee  Chronic pain of left knee     Problem List Patient Active Problem List   Diagnosis Date Noted   Gait abnormality 07/31/2020   Paresthesia 07/31/2020   Low back pain with bilateral sciatica 07/31/2020   Pneumonia due to COVID-19 virus    Bacteremia due to Gram-positive bacteria 08/29/2018   Abscess    Cellulitis of left breast 08/28/2018   Cellulitis of left abdominal wall 08/28/2018   Encounter for routine gynecological examination 04/28/2018   CKD (chronic kidney disease), stage III ([place]) 09/27/2017   Recurrent genital herpes 04/13/2017   Chronic pain disorder 01/25/2017   Malignant neoplasm of upper-outer quadrant of left breast in female, estrogen receptor positive ([place]) 12/16/2016   Chronic knee pain 12/07/2016   Chronic bilateral low back pain without sciatica 12/07/2016    Peripheral musculoskeletal gait disorder 12/07/2016   Encounter for therapeutic drug monitoring 06/16/2016   Generalized osteoarthritis of multiple sites 05/04/2016   Chronic anticoagulation 05/04/2016   Stroke ([place]) 04/14/2016   Aortic valve disease 04/10/2016   S/P AVR 04/10/2016   NICM (nonischemic cardiomyopathy) ([place]) 04/10/2016   Upper airway cough syndrome 03/14/2016   Morbid obesity ([place]) 03/14/2016   COPD (chronic obstructive pulmonary disease) ([place]) 03/11/2016   Thrombocytopenia ([place]) 12/12/2015   Anemia 12/12/2015   Vitamin D deficiency 12/12/2015   Acute urinary retention 12/11/2015   Osteoporosis 12/11/2015   Hip fracture ([place]) 12/10/2015   Aortic atherosclerosis ([place]) 12/10/2015   Lung blebs ([place]) 12/03/2015   Pelvic  fracture ([place]) 08/19/2015   Fall at home 08/19/2015   Essential hypertension 08/19/2015   Mixed hyperlipidemia 08/19/2015   Asthma 08/19/2015   Paroxysmal atrial fibrillation ([place]) 08/19/2015   Hyperthyroidism 08/19/2015   Chronic HFrEF (heart failure with reduced ejection fraction) ([place])    Coronary artery disease    Aortic stenosis     Voncille Lo, PT, [place] Certified Exercise Expert for the Aging Adult  09/16/20 11:36 AM Phone: [phone] Fax: [facility] [address] [place], [place], [address] Phone: [phone]   Fax:  [phone]  Name: Danielle [name] MRN: [id number] Date of Birth: 1945-09-01

## 2020-09-17 ENCOUNTER — Ambulatory Visit (HOSPITAL_COMMUNITY)
Admission: RE | Admit: 2020-09-17 | Discharge: 2020-09-17 | Disposition: A | Discharge status: Home / Self Care | Payer: Medicare Other | Source: Ambulatory Visit | Attending: Cardiology | Admitting: Cardiology

## 2020-09-17 DIAGNOSIS — I5022 Chronic systolic (congestive) heart failure: Secondary | ICD-10-CM | POA: Diagnosis not present

## 2020-09-17 LAB — BASIC METABOLIC PANEL
Anion gap: 8 (ref 5–15)
BUN: 15 mg/dL (ref 8–23)
CO2: 27 mmol/L (ref 22–32)
Calcium: 9.6 mg/dL (ref 8.9–10.3)
Chloride: 103 mmol/L (ref 98–111)
Creatinine, Ser: 1.6 mg/dL — ABNORMAL HIGH (ref 0.44–1.00)
GFR, Estimated: 34 mL/min — ABNORMAL LOW (ref >60)
Glucose, Bld: 119 mg/dL — ABNORMAL HIGH (ref 70–99)
Potassium: 4 mmol/L (ref 3.5–5.1)
Sodium: 138 mmol/L (ref 135–145)

## 2020-09-18 ENCOUNTER — Other Ambulatory Visit: Payer: Self-pay

## 2020-09-18 ENCOUNTER — Ambulatory Visit: Payer: Medicare Other

## 2020-09-18 DIAGNOSIS — M545 Low back pain, unspecified: Secondary | ICD-10-CM | POA: Diagnosis not present

## 2020-09-18 DIAGNOSIS — R2689 Other abnormalities of gait and mobility: Secondary | ICD-10-CM | POA: Diagnosis not present

## 2020-09-18 DIAGNOSIS — R293 Abnormal posture: Secondary | ICD-10-CM | POA: Diagnosis not present

## 2020-09-18 DIAGNOSIS — R262 Difficulty in walking, not elsewhere classified: Secondary | ICD-10-CM | POA: Diagnosis not present

## 2020-09-18 DIAGNOSIS — G8929 Other chronic pain: Secondary | ICD-10-CM

## 2020-09-18 DIAGNOSIS — M25561 Pain in right knee: Secondary | ICD-10-CM | POA: Diagnosis not present

## 2020-09-18 DIAGNOSIS — Z7409 Other reduced mobility: Secondary | ICD-10-CM

## 2020-09-18 DIAGNOSIS — M6281 Muscle weakness (generalized): Secondary | ICD-10-CM

## 2020-09-18 DIAGNOSIS — M25562 Pain in left knee: Secondary | ICD-10-CM | POA: Diagnosis not present

## 2020-09-18 NOTE — Therapy (Signed)
[place], [place], [address] Phone: [phone]   Fax:  [phone]  Physical Therapy Treatment  Patient Details  Name: Danielle [name] MRN: [id number] Date of Birth: [birth year] Referring Provider (PT): Gar Ponto, MD   Encounter Date: 09/18/2020   PT End of Session - 09/18/20 0753     Visit Number 7    Number of Visits 24    Authorization Type [place] Time Period 6th visit FOTO - 10th visit FOTO/reval    PT Start Time 0755    PT Stop Time 0847    PT Time Calculation (min) 52 min    Activity Tolerance Patient tolerated treatment well;Patient limited by fatigue    Behavior During Therapy [facility] for tasks assessed/performed             Past Medical History:  Diagnosis Date   Allergy    Anxiety    Aortic stenosis    Status post bioprosthetic AVR   Arthritis    DJD   Asthma    Breast cancer ([place]) 12/13/2016   Left breast   Chronic systolic CHF (congestive heart failure) ([place])    EF 30% 04/2014   COPD (chronic obstructive pulmonary disease) ([place])    Coronary artery disease    per pt, had LHC prior to AVR in [place] 2016 that did not show any blockages; no stents/bypass   Gastritis    Hyperlipidemia    Hypertension    Hyperthyroidism    Multiple thyroid nodules    Osteoporosis    Paroxysmal atrial fibrillation ([place])    Pelvis fracture ([place]) 08/19/2015   MULTIPLE    Personal history of radiation therapy 2018   Spontaneous pneumothorax 11/28/2015   left    Stroke ([place]) 2000   rt hand weak    Past Surgical History:  Procedure Laterality Date   BREAST LUMPECTOMY Left 12/2016   BREAST LUMPECTOMY WITH RADIOACTIVE SEED AND SENTINEL LYMPH NODE BIOPSY Left 01/14/2017   Procedure: LEFT BREAST LUMPECTOMY WITH RADIOACTIVE SEED AND SENTINEL LYMPH NODE BIOPSY;  Surgeon: Rolm Bookbinder, MD;  Location: [place];  Service: General;  Laterality: Left;   CARDIAC  CATHETERIZATION     CHEST TUBE INSERTION  10/2015   INTRAMEDULLARY (IM) NAIL INTERTROCHANTERIC Left 12/10/2015   Procedure: INTRAMEDULLARY (IM) NAIL INTERTROCHANTRIC;  Surgeon: Leandrew Koyanagi, MD;  Location: [place];  Service: Orthopedics;  Laterality: Left;   PLEURADESIS Left 12/03/2015   Procedure: PLEURADESIS;  Surgeon: Melrose Nakayama, MD;  Location: [facility];  Service: Thoracic;  Laterality: Left;   RESECTION OF APICAL BLEB Left 12/03/2015   Procedure: BLEBECTOMY;  Surgeon: Melrose Nakayama, MD;  Location: [place];  Service: Thoracic;  Laterality: Left;   RIGHT/LEFT HEART CATH AND CORONARY ANGIOGRAPHY N/A 03/10/2020   Procedure: RIGHT/LEFT HEART CATH AND CORONARY ANGIOGRAPHY;  Surgeon: [name], MD;  Location: [place] CV LAB;  Service: Cardiovascular;  Laterality: N/A;   THORACOSCOPY  12/03/2015   VALVE REPLACEMENT     VIDEO ASSISTED THORACOSCOPY Left 12/03/2015   Procedure: VIDEO ASSISTED THORACOSCOPY;  Surgeon: Melrose Nakayama, MD;  Location: [place];  Service: Thoracic;  Laterality: Left;    There were no vitals filed for this visit.   Subjective Assessment - 09/18/20 0802     Subjective The patient reports that she saw her pain MD and they are going to send an order to continue with therapy.    Pertinent History OP, COPD,  pneumothorax, arthritis, Obesity, Cornary Artery disease, breast cancer    Limitations Lifting;Standing;Walking;House hold activities    How long can you sit comfortably? an hour or more ,  Difficulty with standing    How long can you stand comfortably? 15-20 minutes    Patient Stated Goals To be able to walk with a cane    Currently in Pain? Yes    Pain Score 3    with standing   Pain Location Back    Pain Orientation Lower                [facility] PT Assessment - 09/18/20 0001       Assessment   Medical Diagnosis R26.9 (ICD-10-CM) - Gait abnormality  R20.2 (ICD-10-CM) - Paresthesia  M54.42,M54.41 (ICD-10-CM) - Low back pain with bilateral  sciatica, unspecified back pain laterality, unspecified chronicity    Referring Provider (PT) Kristeins, Luanna Salk, MD    Onset Date/Surgical Date --   2 years ago   Hand Dominance Right                           [facility] Adult PT Treatment/Exercise - 09/18/20 0001       Transfers   Transfers Sit to Supine    Sit to Supine 3: Mod assist      Lumbar Exercises: Stretches   Other Lumbar Stretch Exercise Standng hip flexor stretch 20" x 3 UE assist bilaterally      Lumbar Exercises: Aerobic   Nustep L2 x 5 min  LE only      Lumbar Exercises: Standing   Functional Squats 10 reps    Functional Squats Limitations at high mat w/ chair behind    Other Standing Lumbar Exercises [place] HSC/kicks 10 x 2    Other Standing Lumbar Exercises At high mat alternate marches 10 x 2      Lumbar Exercises: Seated   Long Arc Quad on Chair 10 reps;3 sets    LAQ on Chair Limitations alternating with ball squeeze    Other Seated Lumbar Exercises hip abd with blue t band 2 x 20      Lumbar Exercises: Supine   Ab Set 10 reps    AB Set Limitations x 2 sets head lifts for abd engagement    Pelvic Tilt 10 reps    Pelvic Tilt Limitations x 2 sets    Large Ball Abdominal Isometric 10 reps;5 seconds    Large Ball Abdominal Isometric Limitations x 2 sets      Knee/Hip Exercises: Seated   Marching Right;Left;2 sets;20 reps    Marching Limitations alt legs; blue Tband                      PT Short Term Goals - 09/16/20 0948       PT SHORT TERM GOAL #1   Title The patient will be independent in an initial HEP of sitting exercises    Baseline met    Time 3    Period Weeks    Status Achieved    Target Date 09/09/20               PT Long Term Goals - 09/16/20 0948       PT LONG TERM GOAL #1   Title The patient will be able to transfer sit to stand x 5 reps without use of hands in < or equal to 18 seconds.    Baseline 5  x STS 43.34 sec  ( risk of fall 15 sec or  more) Pt must use hands    Time 12    Period Weeks    Status On-going      PT LONG TERM GOAL #2   Title The patient will be able to ambulate x 200 without SOB and use of FWW for community ambulation.    Baseline Pt with SOB walking 120 feet today    Time 12    Period Weeks    Status On-going      PT LONG TERM GOAL #3   Title The patient will improve FOTO outcome score to 46% for improvement of functional home activities.    Baseline 30% eval    Time 12    Period Weeks    Status On-going      PT LONG TERM GOAL #4   Title The patient will be able to stand for 20 minutes with low back pain under or equal to 3/10 and use of hands for support    Baseline unable to stand without use of hands and pain 7/10    Time 12    Period Weeks    Status On-going      PT LONG TERM GOAL #5   Title Pt will gross LE MMT to 4+/5 for improved strength.    Baseline see flow chart MMT    Time 8    Period Weeks    Status On-going                   Plan - 09/18/20 0754     Clinical Impression Statement Danielle [name] continues to have SOB after ambulation into the clinic.  Her oxygen was 89% and HR 65 bpm.  The patient reports that in standing she gets the low back pain.  She has difficulty with transfers onto the Nustep and onto the Mat for supine exercises.  She needs assistance for her legs.  The patient reports that her right knee limits her and she had the rod into the left thigh.  Advanced with standing exercises for endurance and hip strengthening.  The patient will continue with aquatic therapy and land strengthening.  The patient is doing to see if she can into a pool for self management.  Recommend continued therapy for mobility and transfers.    Personal Factors and Comorbidities Comorbidity 3+;Fitness    Comorbidities OP, COPD, Coronary artery disease, obesity, arthritis    Examination-Activity Limitations Bathing;Locomotion Level;Transfers;Bed Mobility;Caring for  Others;Carry;Squat;Dressing;Stairs;Stand;Toileting;Lift;Sleep    Examination-Participation Restrictions Church;Cleaning;Meal Prep;Community Activity;Laundry;Shop;Volunteer;[place] Work    PT Treatment/Interventions Aquatic Therapy;ADLs/Self Care Home Management;Moist Heat;Cryotherapy;Gait training;Stair training;Functional mobility training;Therapeutic activities;Therapeutic exercise;Balance training;Neuromuscular re-education;Manual techniques;Orthotic Fit/Training;Patient/family education;Dry needling;Passive range of motion;Energy conservation    PT Next Visit Plan hip flexbility, BKFO, lower extremity/core strengthening, gait training.    PT Home Exercise Plan Access Code: [email or web address]    Consulted and Agree with Plan of Care Patient             Patient will benefit from skilled therapeutic intervention in order to improve the following deficits and impairments:  Abnormal gait, Difficulty walking, Cardiopulmonary status limiting activity, Decreased endurance, Obesity, Decreased activity tolerance, Decreased knowledge of precautions, Decreased skin integrity, Decreased balance, Pain, Decreased mobility, Decreased strength  Visit Diagnosis: Difficulty in walking, not elsewhere classified  Decreased functional mobility and endurance  Muscle weakness (generalized)  Chronic bilateral low back pain without sciatica     Problem List Patient Active Problem List   Diagnosis  Date Noted   Gait abnormality 07/31/2020   Paresthesia 07/31/2020   Low back pain with bilateral sciatica 07/31/2020   Pneumonia due to COVID-19 virus    Bacteremia due to Gram-positive bacteria 08/29/2018   Abscess    Cellulitis of left breast 08/28/2018   Cellulitis of left abdominal wall 08/28/2018   Encounter for routine gynecological examination 04/28/2018   CKD (chronic kidney disease), stage III ([place]) 09/27/2017   Recurrent genital herpes 04/13/2017   Chronic pain disorder 01/25/2017   Malignant neoplasm of  upper-outer quadrant of left breast in female, estrogen receptor positive ([place]) 12/16/2016   Chronic knee pain 12/07/2016   Chronic bilateral low back pain without sciatica 12/07/2016   Peripheral musculoskeletal gait disorder 12/07/2016   Encounter for therapeutic drug monitoring 06/16/2016   Generalized osteoarthritis of multiple sites 05/04/2016   Chronic anticoagulation 05/04/2016   Stroke ([place]) 04/14/2016   Aortic valve disease 04/10/2016   S/P AVR 04/10/2016   NICM (nonischemic cardiomyopathy) ([place]) 04/10/2016   Upper airway cough syndrome 03/14/2016   Morbid obesity ([place]) 03/14/2016   COPD (chronic obstructive pulmonary disease) ([place]) 03/11/2016   Thrombocytopenia ([place]) 12/12/2015   Anemia 12/12/2015   Vitamin D deficiency 12/12/2015   Acute urinary retention 12/11/2015   Osteoporosis 12/11/2015   Hip fracture ([place]) 12/10/2015   Aortic atherosclerosis ([place]) 12/10/2015   Lung blebs ([place]) 12/03/2015   Pelvic fracture ([place]) 08/19/2015   Fall at home 08/19/2015   Essential hypertension 08/19/2015   Mixed hyperlipidemia 08/19/2015   Asthma 08/19/2015   Paroxysmal atrial fibrillation ([place]) 08/19/2015   Hyperthyroidism 08/19/2015   Chronic HFrEF (heart failure with reduced ejection fraction) ([facility])    Coronary artery disease    Aortic stenosis    Rich Number, PT, DPT, OCS, Crt. DN  Bethena Midget 09/18/2020, 9:01 AM  [facility] [address] [place], [place], [address] Phone: [phone]   Fax:  [phone]  Name: Danielle [name] MRN: [id number] Date of Birth: Jan 21, 1946

## 2020-09-19 ENCOUNTER — Ambulatory Visit: Payer: Medicare Other

## 2020-09-19 DIAGNOSIS — M545 Low back pain, unspecified: Secondary | ICD-10-CM

## 2020-09-19 DIAGNOSIS — I5022 Chronic systolic (congestive) heart failure: Secondary | ICD-10-CM

## 2020-09-19 DIAGNOSIS — G8929 Other chronic pain: Secondary | ICD-10-CM

## 2020-09-19 DIAGNOSIS — I480 Paroxysmal atrial fibrillation: Secondary | ICD-10-CM

## 2020-09-19 DIAGNOSIS — I10 Essential (primary) hypertension: Secondary | ICD-10-CM

## 2020-09-19 NOTE — Patient Instructions (Signed)
Visit Information  PATIENT GOALS:  Goals Addressed             This Visit's Progress    RNCM-Track and Manage Symptoms of cardiovascular disease(CHF, HTN,atrial fibrillation)   On track    Timeframe:  Long-Range Goal Priority:  Medium Start Date:     06/24/20                        Expected End Date:     12/29/20 Follow up date: 10/24/20                         - begin a heart failure diary - develop a rescue plan - eat more whole grains, fruits and vegetables, lean meats and healthy fats - follow rescue plan if symptoms flare-up - know when to call the doctor - track symptoms and what helps feel better or worse  -Call provider office for new concerns, questions, or BP outside discussed parameters -Follow a low sodium diet/DASH diet -check blood pressure 3 times per week - write blood pressure and weight results in a log or diary  -Plan to fill your medication box weekly  Why is this important?   You will be able to handle your symptoms better if you keep track of them.  Making some simple changes to your lifestyle will help.  Eating healthy is one thing you can do to take good care of yourself.       RNCM:Cope with Chronic Pain       Timeframe:  Long-Range Goal Priority:  Medium Start Date:      06/24/20                       Expected End Date:        12/29/20               Follow Up Date 10/24/2020    - learn how to meditate - learn relaxation techniques - practice acceptance of chronic pain - practice relaxation or meditation daily - tell myself I can (not I can't) - think of new ways to do favorite things - use distraction techniques - use relaxation during pain    Why is this important?   Stress makes chronic pain feel worse.  Feelings like depression, anxiety, stress and anger can make your body more sensitive to pain.  Learning ways to cope with stress or depression may help you find some relief from the pain.     Notes:       Heart Failure Eating  Plan Heart failure, also called congestive heart failure, occurs when your heart does not pump blood well enough to meet your body's needs for oxygen-rich blood. Heart failure is a long-term (chronic) condition. Living with heart failure can be challenging. Following your health care provider's instructions about a healthy lifestyle and working with a dietitian to choose the right foods may help to improve your symptoms. An eating plan for someone with heart failure will include changes that limit the intake of salt (sodium) and unhealthy fat. What are tips for following this plan? Reading food labels Check food labels for the amount of sodium per serving. Choose foods that have less than 140 mg (milligrams) of sodium in each serving. Check food labels for the number of calories per serving. This is important if you need to limit your daily calorie intake to lose weight. Check food  labels for the serving size. If you eat more than one serving, you will be eating more sodium and calories than what is listed on the label. Look for foods that are labeled as "sodium-free," "very low sodium," or "low sodium." Foods labeled as "reduced sodium" or "lightly salted" may still have more sodium than what is recommended for you. Cooking Avoid adding salt when cooking. Ask your health care provider or dietitian before using salt substitutes. Season food with salt-free seasonings, spices, or herbs. Check the label of seasoning mixes to make sure they do not contain salt. Cook with heart-healthy oils, such as olive, canola, soybean, or sunflower oil. Do not fry foods. Cook foods using low-fat methods, such as baking, boiling, grilling, and broiling. Limit unhealthy fats when cooking by: Removing the skin from poultry, such as chicken. Removing all visible fats from meats. Skimming the fat off from stews, soups, and gravies before serving them. Meal planning  Limit your intake of: Processed, canned, or  prepackaged foods. Foods that are high in trans fat, such as fried foods. Sweets, desserts, sugary drinks, and other foods with added sugar. Full-fat dairy products, such as whole milk. Eat a balanced diet. This may include: 4-5 servings of fruit each day and 4-5 servings of vegetables each day. At each meal, try to fill one-half of your plate with fruits and vegetables. Up to 6-8 servings of whole grains each day. Up to 2 servings of lean meat, poultry, or fish each day. One serving of meat is equal to 3 oz (85 g). This is about the same size as a deck of cards. 2 servings of low-fat dairy each day. Heart-healthy fats. Healthy fats called omega-3 fatty acids are found in foods such as flaxseed and cold-water fish like sardines, salmon, and mackerel. Aim to eat 25-35 g (grams) of fiber a day. Foods that are high in fiber include apples, broccoli, carrots, beans, peas, and whole grains. Do not add salt or condiments that contain salt (such as soy sauce) to foods before eating. When eating at a restaurant, ask that your food be prepared with less salt or no salt, if possible. Try to eat 2 or more vegetarian meals each week. Eat more home-cooked food and eat less restaurant, buffet, and fast food.  General information Do not eat more than 2,300 mg of sodium a day. The amount of sodium that is recommended for you may be lower, depending on your condition. Maintain a healthy body weight as directed. Ask your health care provider what a healthy weight is for you. Check your weight every day. Work with your health care provider and dietitian to make a plan that is right for you to lose weight or maintain your current weight. Limit how much fluid you drink. Ask your health care provider or dietitian how much fluid you can have each day. Limit or avoid alcohol as told by your health care provider or dietitian. Recommended foods Fruits All fresh, frozen, and canned fruits. Dried fruits, such as  raisins, prunes,and cranberries. Vegetables All fresh vegetables. Vegetables that are frozen without sauce or added salt.Low-sodium or sodium-free canned vegetables. Grains Bread with less than 80 mg of sodium per slice. Whole-wheat pasta, quinoa, and brown rice. Oats and oatmeal. Barley. [place]. Grits and cream of wheat.Whole-grain and whole-wheat cold cereal. Meats and other protein foods Lean cuts of meat. Skinless chicken and [place]. Fish with high omega-3 fatty acids, such as salmon, sardines, and other cold-water fishes. Eggs. Driedbeans, peas, and edamame.  Unsalted nuts and nut butters. Dairy Low-fat or nonfat (skim) milk and dried milk. Rice milk, soy milk, and almond milk. Low-fat or nonfatyogurt. Small amounts of reduced-sodium block cheese. Low-sodium cottage cheese. Fats and oils Olive, canola, soybean, flaxseed, avocado, or sunflower oil. Sweets and desserts Applesauce. Granola bars. Sugar-free pudding and gelatin. Frozen fruit bars. Seasoning and other foods Fresh and dried herbs. Lemon or lime juice. Vinegar. Low-sodium ketchup.Salt-free marinades, salad dressings, sauces, and seasonings. The items listed above may not be a complete list of foods and beverages you can eat. Contact a dietitian for more information. Foods to avoid Fruits Fruits that are dried with sodium-containing preservatives. Vegetables Canned vegetables. Frozen vegetables with sauce or seasonings. Creamedvegetables. [place] fries. Onion rings. Pickled vegetables and sauerkraut. Grains Bread with more than 80 mg of sodium per slice. Hot or cold cereal with more than 140 mg sodium per serving. Salted pretzels and crackers. Prepackagedbreadcrumbs. Bagels, croissants, and biscuits. Meats and other protein foods Ribs and chicken wings. Bacon, ham, pepperoni, bologna, salami, and packaged luncheon meats. Hot dogs, bratwurst, and sausage. Canned meat. Smoked meat andfish. Salted nuts and seeds. Dairy Whole milk,  half-and-half, and cream. Buttermilk. Processed cheese, cheese spreads, and cheese curds. Regular cottage cheese. Feta cheese. Shreddedcheese. String cheese. Fats and oils Butter, lard, shortening, ghee, and bacon fat. Canned and packaged gravies. Seasoning and other foods Onion salt, garlic salt, table salt, and sea salt. Marinades. Regular salad dressings. Relishes, pickles, and olives. Meat flavorings and tenderizers, and bouillon cubes. Horseradish, ketchup, and mustard. Worcestershire sauce. Teriyaki sauce, soy sauce (including reduced sodium). Hot sauce and Tabasco sauce. Steak sauce, fish sauce, oyster sauce, and cocktail sauce. Tacoseasonings. Barbecue sauce. Tartar sauce. The items listed above may not be a complete list of foods and beverages you should avoid. Contact a dietitian for more information. Summary A heart failure eating plan includes changes that limit your intake of sodium and unhealthy fat, and it may help you lose weight or maintain a healthy weight. Your health care provider may also recommend limiting how much fluid you drink. Most people with heart failure should eat no more than 2,300 mg of salt (sodium) a day. The amount of sodium that is recommended for you may be lower, depending on your condition. Contact your health care provider or dietitian before making any major changes to your diet. This information is not intended to replace advice given to you by your health care provider. Make sure you discuss any questions you have with your healthcare provider. Document Revised: 10/01/2019 Document Reviewed: 10/01/2019 Elsevier Patient Education  2022 [place].   Patient verbalizes understanding of instructions provided today and agrees to view in [place].   Telephone follow up appointment with care management team member scheduled for: 10/24/20 at 3:30 PM  [place], Jackquline Denmark, [place] Management Coordinator [facility] [phone], [place] [phone]

## 2020-09-19 NOTE — Chronic Care Management (AMB) (Signed)
Chronic Care Management   CCM RN Visit Note  09/19/2020 Name: Danielle [name] MRN: [id number] DOB: 12/06/1945  Subjective: Danielle [name] is a 75 y.o. year old female who is a primary care patient of [name], [name], MD. The care management team was consulted for assistance with disease management and care coordination needs.    Engaged with patient by telephone for follow up visit in response [name] provider referral for case management and/or care coordination services.   Consent [name] Services:  The patient was given information about Chronic Care Management services, agreed [name] services, and gave verbal consent prior [name] initiation of services.  Please see initial visit note for detailed documentation.   Patient agreed [name] services and verbal consent obtained.   Assessment: Review of patient past medical history, allergies, medications, health status, including review of consultants reports, laboratory and other test data, was performed as part of comprehensive evaluation and provision of chronic care management services.   SDOH (Social Determinants of Health) assessments and interventions performed:    CCM Care Plan  Allergies  Allergen Reactions   Lisinopril Cough   Tetanus Toxoid Adsorbed Swelling    Arm swelling   Other Cough    PEANUTS    Outpatient Encounter Medications as of 09/19/2020  Medication Sig Note   albuterol (PROVENTIL HFA;VENTOLIN HFA) 108 (90 Base) MCG/ACT inhaler Inhale 2 puffs into the lungs every 6 (six) hours as needed for wheezing or shortness of breath.     alendronate (FOSAMAX) 70 MG tablet Take 1 tablet (70 mg total) by mouth once a week. Take with a full glass of water on an empty stomach.    ascorbic acid (VITAMIN C) 500 MG tablet Take 500 mg by mouth daily.    budesonide-formoterol (SYMBICORT) 160-4.5 MCG/ACT inhaler Inhale 2 puffs into the lungs daily as needed.    carvedilol (COREG) 25 MG tablet Take 25 mg by mouth 2 (two) times  daily with a meal.    Cholecalciferol 25 MCG (1000 UT) tablet Take 1,000 Units by mouth daily.    dapagliflozin propanediol (FARXIGA) 10 MG TABS tablet Take 1 tablet (10 mg total) by mouth daily before breakfast.    diclofenac sodium (VOLTAREN) 1 % GEL Apply 4 g topically 4 (four) times daily.    diphenhydramine-acetaminophen (TYLENOL PM) 25-500 MG TABS tablet Take 2 tablets by mouth at bedtime.    DULoxetine (CYMBALTA) 60 MG capsule Take 1 capsule (60 mg total) by mouth daily. (Patient not taking: Reported on 09/19/2020)    fluticasone (FLONASE) 50 MCG/ACT nasal spray Place 1 spray into both nostrils daily.    methimazole (TAPAZOLE) 5 MG tablet Take 3 tablets (15 mg total) by mouth daily.    pantoprazole (PROTONIX) 40 MG tablet Take 1 tablet (40 mg total) by mouth daily.    rosuvastatin (CRESTOR) 40 MG tablet TAKE 1 TABLET BY MOUTH  DAILY    sacubitril-valsartan (ENTRESTO) 49-51 MG Take 1 tablet by mouth 2 (two) times daily.    senna-docusate (SENOKOT-S) 8.6-50 MG tablet Take 2 tablets at bedtime by mouth.     tamoxifen (NOLVADEX) 20 MG tablet Take 1 tablet (20 mg total) by mouth daily.    torsemide (DEMADEX) 20 MG tablet Take 1 tablet (20 mg total) by mouth daily.    traMADol (ULTRAM) 50 MG tablet Take 1 tablet (50 mg total) by mouth 3 (three) times daily. 09/16/2020: Fill date 08/27/20 #90 today #55 last dose today   trolamine salicylate (ASPERCREME) 10 % cream Apply 1 application  topically as needed for muscle pain.    valACYclovir (VALTREX) 500 MG tablet Take 500 mg by mouth 2 (two) times daily as needed.    warfarin (COUMADIN) 3 MG tablet Take 1 tablet daily or Take as directed by anticoagulation clinic    No facility-administered encounter medications on file as of 09/19/2020.    Patient Active Problem List   Diagnosis Date Noted   Gait abnormality 07/31/2020   Paresthesia 07/31/2020   Low back pain with bilateral sciatica 07/31/2020   Pneumonia due [name] COVID-19 virus    Bacteremia due  [name] Gram-positive bacteria 08/29/2018   Abscess    Cellulitis of left breast 08/28/2018   Cellulitis of left abdominal wall 08/28/2018   Encounter for routine gynecological examination 04/28/2018   CKD (chronic kidney disease), stage III ([place]) 09/27/2017   Recurrent genital herpes 04/13/2017   Chronic pain disorder 01/25/2017   Malignant neoplasm of upper-outer quadrant of left breast in female, estrogen receptor positive ([place]) 12/16/2016   Chronic knee pain 12/07/2016   Chronic bilateral low back pain without sciatica 12/07/2016   Peripheral musculoskeletal gait disorder 12/07/2016   Encounter for therapeutic drug monitoring 06/16/2016   Generalized osteoarthritis of multiple sites 05/04/2016   Chronic anticoagulation 05/04/2016   Stroke ([place]) 04/14/2016   Aortic valve disease 04/10/2016   S/P AVR 04/10/2016   NICM (nonischemic cardiomyopathy) ([place]) 04/10/2016   Upper airway cough syndrome 03/14/2016   Morbid obesity ([place]) 03/14/2016   COPD (chronic obstructive pulmonary disease) ([place]) 03/11/2016   Thrombocytopenia ([place]) 12/12/2015   Anemia 12/12/2015   Vitamin D deficiency 12/12/2015   Acute urinary retention 12/11/2015   Osteoporosis 12/11/2015   Hip fracture ([place]) 12/10/2015   Aortic atherosclerosis ([place]) 12/10/2015   Lung blebs ([place]) 12/03/2015   Pelvic fracture ([place]) 08/19/2015   Fall at home 08/19/2015   Essential hypertension 08/19/2015   Mixed hyperlipidemia 08/19/2015   Asthma 08/19/2015   Paroxysmal atrial fibrillation ([place]) 08/19/2015   Hyperthyroidism 08/19/2015   Chronic HFrEF (heart failure with reduced ejection fraction) ([facility])    Coronary artery disease    Aortic stenosis     Conditions [name] be addressed/monitored:Atrial Fibrillation, CHF, HTN, HLD, and chronic pain  Care Plan : RNCM: Cardiovascular Disease Management( Heart Failure, HTN, paroxysmal atrial fibrillation)  Updates made by Dimitri Ped, RN since 09/19/2020 12:00 AM     Problem: Lack of  long term management of Cardiovascular Disease( Heart Failure, HTN, paroxysmal atrial fibrillation)   Priority: Medium     Long-Range Goal: Long term self mangement of Cardiovascular Disease Management( Heart Failure, HTN, paroxysmal atrial fibrillation)   Start Date: 06/24/2020  Expected End Date: 12/29/2020  This Visit's Progress: On track  Recent Progress: On track  Priority: Medium  Note:   Current Barriers:  Knowledge deficits related [name] basic heart failure pathophysiology and self care management of Cardiovascular Disease Management( Heart Failure, HTN, paroxysmal atrial fibrillation)  Does not adhere [name] provider recommendations re: low sodium diet at times Reports she went [name] the heart failure clinic and they changed her Lasix [name] torsemide and started her on Farxiga. States they have applied for help [name] pay for the [place] that the torsemide is working better [name] help her keep her fluid off.  Pt reports weighting daily, denies any chest pains, reports her swelling in legs has improved.  Reports she wears compression hose but not always, reports shortness of breath with exertion,  denies any recent atrial fibrillation, reports she has her blood checked regularly  for her Coumadin, checks her B/P 4-5 times a week States she is going [name] physical therapy now and is doing water exercises. States she tries [name] follow a low sodium diet.  States she wants [name] know if she can take Omega XL supplements with her medications Nurse Case Manager Clinical Goal(s):  patient will weigh self daily and record patient will verbalize understanding of Heart Failure Action Plan and when [name] call doctor patient will take all Heart Failure mediations as prescribed Interventions:  Collaboration with [name], [name], MD regarding development and update of comprehensive plan of care as evidenced by provider attestation and co-signature Inter-disciplinary care team collaboration (see longitudinal plan of  care) Reveiwed basic overview and discussion of pathophysiology of Heart Failure Reinforced verbal education on low sodium diet Reviewed Heart Failure Action Plan in depth  Assessed for scales in home-has scale Discussed importance of daily weight Reviewed role of diuretics in prevention of fluid overload Reviewed upcoming provider appointments Heart failure clinic 10/01/20, Dr. Saunders [facility] cardiology 11/12/20/22 Communicated with CCM pharm D pt's question concerning use of Omega XL Self-Care Activities:  Takes Heart Failure Medications as prescribed Weighs daily and record (notifying MD of 3 lb weight gain over night or 5 lb in a week)  Patient Goals:  - Take Heart Failure Medications as prescribed - Weigh daily and record (notify MD with 3 lb weight gain over night or 5 lb in a week) - Follow CHF Action Plan - Adhere [name] low sodium diet - begin a heart failure diary - bring diary [name] all appointments - develop a rescue plan - eat more whole grains, fruits and vegetables, lean meats and healthy fats - follow rescue plan if symptoms flare-up - know when [name] call the doctor - follow activity or exercise plan - make an activity or exercise plan - meet with physical therapist - pace activity allowing for rest - do ankle pumps when sitting - keep legs up while sitting - track weight in diary - use salt in moderation - watch for swelling in feet, ankles and legs every day -Calls provider office for new concerns, questions, or BP outside discussed parameters -Follows a low sodium diet/DASH diet -check blood pressure 3 times per week - write blood pressure and weight results in a log or diary  -Plan [name] fill your medication box weekly  Follow Up Plan: Telephone follow up appointment with care management team member scheduled for: 10/24/20 at 3:30 PM The patient has been provided with contact information for the care management team and has been advised [name] call with any health related questions or  concerns.      Care Plan : RNCM:Chronic Pain (Adult)  Updates made by Dimitri Ped, RN since 09/19/2020 12:00 AM     Problem: Chronic Pain Management (Chronic Pain)   Priority: Medium     Long-Range Goal: Chronic Pain Managed   Start Date: 06/24/2020  Expected End Date: 12/29/2020  This Visit's Progress: On track  Recent Progress: On track  Priority: Medium  Note:   Current Barriers:  Knowledge Deficits related [name] self-health management of acute or chronic pain of lower back due [name] degenerative disc disease  Chronic Disease Management support and education needs related [name] chronic pain Chronic Disease Management support and education needs related [name] chronic back pain Does not adhere [name] provider recommendations re: exercise Unable [name] perform IADLs independently uses walker  Pt states she has been going [name] PT [name] help with her back pain, States  she is trying to do her exercises on the days she does not go [name] PT.  States she would like [name] get strong enough [name] only need a cane and be able [name] help her husband around the house more. states she is taking her Tramadol as ordered.  States she is feeling much better about her sisters passing  Clinical Goal(s):  patient will verbalize understanding of plan for pain management. , patient will attend all scheduled medical appointments: PT 09/25/20, Dr. Read Drivers 11/18/20, patient will demonstrate use of different relaxation  skills and/or diversional activities [name] assist with pain reduction (distraction, imagery, relaxation, massage, acupressure, TENS, heat, and cold application., patient will report pain at a level less than 3 [name] 4 on a 10-10 rating scale., patient will use pharmacological and nonpharmacological pain relief strategies as prescribed. , patient will verbalize acceptable level of pain relief and ability [name] engage in desired activities, and patient will engage in desired activities without an increase in pain level Interventions:   Collaboration with [name], [name], MD regarding development and update of comprehensive plan of care as evidenced by provider attestation and co-signature Pain assessment performed Medications reviewed Reinforced on methods of chronic pain control  Discussed plans with patient for ongoing care management follow up and provided patient with direct contact information for care management team Reviewed scheduled/upcoming provider appointments including:  Dr. Read Drivers 11/20/20 Social Work working with pt for grief counseling after death of sister-completed grief counseling Reviewed benefits of exercise and therapy [name] help with chronic pain Patient Goals/Self Care Activities:  Will self-administer medications as prescribed Will attend all scheduled provider appointments Will call pharmacy for medication refills 7 days prior [name] needed refill date Patient will calls provider office for new concerns or questions  learn how [name] meditate learn relaxation techniques practice acceptance of chronic pain  practice relaxation or meditation daily  tell myself I can (not I can't) think of new ways to do favorite things  use distraction techniques  use relaxation during pain Follow Up Plan: Telephone follow up appointment with care management team member scheduled for: 10/24/20 at 3:30 PM The patient has been provided with contact information for the care management team and has been advised [name] call with any health related questions or concerns.        Plan:Telephone follow up appointment with care management team member scheduled for:  10/24/20 and The patient has been provided with contact information for the care management team and has been advised [name] call with any health related questions or concerns.  Peter Garter RN, Jackquline [place], CDE Care Management Coordinator [facility] [phone], Mobile [phone]

## 2020-09-22 ENCOUNTER — Other Ambulatory Visit: Payer: Self-pay

## 2020-09-22 ENCOUNTER — Ambulatory Visit (INDEPENDENT_AMBULATORY_CARE_PROVIDER_SITE_OTHER): Payer: Medicare Other | Admitting: General Practice

## 2020-09-22 DIAGNOSIS — Z7901 Long term (current) use of anticoagulants: Secondary | ICD-10-CM | POA: Diagnosis not present

## 2020-09-22 LAB — POCT INR: INR: 5.1 — AB (ref 2.0–3.0)

## 2020-09-22 NOTE — Patient Instructions (Addendum)
Pre visit review using our clinic review tool, if applicable. No additional management support is needed unless otherwise documented below in the visit note.  Skip dosage Tues, Wed and Thurs. (Patient already took dosage for today).  On Friday start taking 1 tablet daily except 1/2 tablet on Monday Wed and Fridays.  Re-check in 7 to 10 days.

## 2020-09-24 ENCOUNTER — Telehealth (HOSPITAL_COMMUNITY): Payer: Self-pay

## 2020-09-24 ENCOUNTER — Ambulatory Visit: Payer: Medicare Other | Admitting: Skilled Nursing Facility1

## 2020-09-24 MED ORDER — TORSEMIDE 20 MG PO TABS: 20.0000 mg | ORAL_TABLET | ORAL | 3 refills | Status: DC

## 2020-09-24 NOTE — Telephone Encounter (Signed)
Patient advised and verbalized understanding. Med list updated to reflect changes, patient has pending appt 8/3 will recheck labs at that time.   Meds ordered this encounter  Medications   torsemide (DEMADEX) 20 MG tablet    Sig: Take 1 tablet (20 mg total) by mouth every other day.    Dispense:  90 tablet    Refill:  3

## 2020-09-24 NOTE — Telephone Encounter (Signed)
-----   Message from Larey Dresser, MD sent at 09/17/2020  5:34 PM EDT ----- With rise in creatinine, decrease torsemide to 20 mg every other day, BMET 1 week.

## 2020-09-25 ENCOUNTER — Ambulatory Visit: Payer: Medicare Other

## 2020-09-25 ENCOUNTER — Other Ambulatory Visit: Payer: Self-pay

## 2020-09-25 DIAGNOSIS — G8929 Other chronic pain: Secondary | ICD-10-CM | POA: Diagnosis not present

## 2020-09-25 DIAGNOSIS — M25561 Pain in right knee: Secondary | ICD-10-CM | POA: Diagnosis not present

## 2020-09-25 DIAGNOSIS — M6281 Muscle weakness (generalized): Secondary | ICD-10-CM

## 2020-09-25 DIAGNOSIS — R262 Difficulty in walking, not elsewhere classified: Secondary | ICD-10-CM

## 2020-09-25 DIAGNOSIS — Z7409 Other reduced mobility: Secondary | ICD-10-CM

## 2020-09-25 DIAGNOSIS — M545 Low back pain, unspecified: Secondary | ICD-10-CM

## 2020-09-25 DIAGNOSIS — M25562 Pain in left knee: Secondary | ICD-10-CM | POA: Diagnosis not present

## 2020-09-25 DIAGNOSIS — R2689 Other abnormalities of gait and mobility: Secondary | ICD-10-CM | POA: Diagnosis not present

## 2020-09-25 DIAGNOSIS — R293 Abnormal posture: Secondary | ICD-10-CM | POA: Diagnosis not present

## 2020-09-25 NOTE — Therapy (Signed)
[place], [place], [address] Phone: [phone]   Fax:  [phone]  Physical Therapy Treatment  Patient Details  Name: Danielle [name] MRN: [id number] Date of Birth: [birth year] Referring Provider (PT): Gar Ponto, MD   Encounter Date: 09/25/2020   PT End of Session - 09/25/20 0938     Visit Number 8    Number of Visits 24    Authorization Type [place] Time Period 6th visit FOTO - 10th visit FOTO/reval    Progress Note Due on Visit 10    PT Start Time 0932    PT Stop Time 1015    PT Time Calculation (min) 43 min    Activity Tolerance Patient tolerated treatment well;Patient limited by fatigue    Behavior During Therapy [facility] for tasks assessed/performed             Past Medical History:  Diagnosis Date   Allergy    Anxiety    Aortic stenosis    Status post bioprosthetic AVR   Arthritis    DJD   Asthma    Breast cancer ([place]) 12/13/2016   Left breast   Chronic systolic CHF (congestive heart failure) ([place])    EF 30% 04/2014   COPD (chronic obstructive pulmonary disease) ([place])    Coronary artery disease    per pt, had LHC prior to AVR in [place] 2016 that did not show any blockages; no stents/bypass   Gastritis    Hyperlipidemia    Hypertension    Hyperthyroidism    Multiple thyroid nodules    Osteoporosis    Paroxysmal atrial fibrillation ([place])    Pelvis fracture ([place]) 08/19/2015   MULTIPLE    Personal history of radiation therapy 2018   Spontaneous pneumothorax 11/28/2015   left    Stroke ([place]) 2000   rt hand weak    Past Surgical History:  Procedure Laterality Date   BREAST LUMPECTOMY Left 12/2016   BREAST LUMPECTOMY WITH RADIOACTIVE SEED AND SENTINEL LYMPH NODE BIOPSY Left 01/14/2017   Procedure: LEFT BREAST LUMPECTOMY WITH RADIOACTIVE SEED AND SENTINEL LYMPH NODE BIOPSY;  Surgeon: Rolm Bookbinder, MD;  Location: [facility];  Service:  General;  Laterality: Left;   CARDIAC CATHETERIZATION     CHEST TUBE INSERTION  10/2015   INTRAMEDULLARY (IM) NAIL INTERTROCHANTERIC Left 12/10/2015   Procedure: INTRAMEDULLARY (IM) NAIL INTERTROCHANTRIC;  Surgeon: Leandrew Koyanagi, MD;  Location: [facility];  Service: Orthopedics;  Laterality: Left;   PLEURADESIS Left 12/03/2015   Procedure: PLEURADESIS;  Surgeon: Melrose Nakayama, MD;  Location: [facility];  Service: Thoracic;  Laterality: Left;   RESECTION OF APICAL BLEB Left 12/03/2015   Procedure: BLEBECTOMY;  Surgeon: Melrose Nakayama, MD;  Location: [facility];  Service: Thoracic;  Laterality: Left;   RIGHT/LEFT HEART CATH AND CORONARY ANGIOGRAPHY N/A 03/10/2020   Procedure: RIGHT/LEFT HEART CATH AND CORONARY ANGIOGRAPHY;  Surgeon: [name], MD;  Location: [facility];  Service: Cardiovascular;  Laterality: N/A;   THORACOSCOPY  12/03/2015   VALVE REPLACEMENT     VIDEO ASSISTED THORACOSCOPY Left 12/03/2015   Procedure: VIDEO ASSISTED THORACOSCOPY;  Surgeon: Melrose Nakayama, MD;  Location: [facility];  Service: Thoracic;  Laterality: Left;    There were no vitals filed for this visit.   Subjective Assessment - 09/25/20 0938     Subjective The patient reports that her left knee is bothering her today.    Currently in Pain? Yes  Pain Score 6     Pain Location Knee    Pain Orientation Left                [facility] PT Assessment - 09/25/20 0001       Assessment   Medical Diagnosis R26.9 (ICD-10-CM) - Gait abnormality  R20.2 (ICD-10-CM) - Paresthesia  M54.42,M54.41 (ICD-10-CM) - Low back pain with bilateral sciatica, unspecified back pain laterality, unspecified chronicity    Referring Provider (PT) Kristeins, Luanna Salk, MD                           [facility] Adult PT Treatment/Exercise - 09/25/20 0001       Ambulation/Gait   Ambulation/Gait Yes    Ambulation/Gait Assistance 5: Supervision    Ambulation/Gait Assistance Details x 3 w/ SOB    Ambulation  Distance (Feet) 100 Feet    Assistive device 4-wheeled walker    Gait Pattern Step-through pattern;Decreased step length - left;Decreased step length - right;Poor foot clearance - left      Lumbar Exercises: Stretches   Other Lumbar Stretch Exercise Standng hip flexor stretch 20" x 2 UE assist bilaterally in // bars      Lumbar Exercises: Aerobic   Nustep L2 x 5 min  LE only      Lumbar Exercises: Standing   Functional Squats 10 reps    Functional Squats Limitations in // bars    Other Standing Lumbar Exercises At high mat alternate marches 10 x 2      Knee/Hip Exercises: Standing   Hip Abduction Right;Left;2 sets;10 reps    Abduction Limitations in // bars    Hip Extension Right;Left;10 reps;2 sets    Extension Limitations in // bars                      PT Short Term Goals - 09/16/20 0948       PT SHORT TERM GOAL #1   Title The patient will be independent in an initial HEP of sitting exercises    Baseline met    Time 3    Period Weeks    Status Achieved    Target Date 09/09/20               PT Long Term Goals - 09/16/20 0948       PT LONG TERM GOAL #1   Title The patient will be able to transfer sit to stand x 5 reps without use of hands in < or equal to 18 seconds.    Baseline 5 x STS 43.34 sec  ( risk of fall 15 sec or more) Pt must use hands    Time 12    Period Weeks    Status On-going      PT LONG TERM GOAL #2   Title The patient will be able to ambulate x 200 without SOB and use of FWW for community ambulation.    Baseline Pt with SOB walking 120 feet today    Time 12    Period Weeks    Status On-going      PT LONG TERM GOAL #3   Title The patient will improve FOTO outcome score to 46% for improvement of functional home activities.    Baseline 30% eval    Time 12    Period Weeks    Status On-going      PT LONG TERM GOAL #4   Title The patient will  be able to stand for 20 minutes with low back pain under or equal to 3/10 and use of  hands for support    Baseline unable to stand without use of hands and pain 7/10    Time 12    Period Weeks    Status On-going      PT LONG TERM GOAL #5   Title Pt will gross LE MMT to 4+/5 for improved strength.    Baseline see flow chart MMT    Time 8    Period Weeks    Status On-going                   Plan - 09/25/20 1000     Clinical Impression Statement Danielle [name] continues to have SOB throughout the therapy session.  Progressed standing strengthening exercises for the hips and core.  The patient did need rest breaks to recover.  The patient continues to need assistance for transfers on and of  the NuStep.  She needs moderate use of bilateral UEs for sit to stand transfers.  Recommend continued therapy for strengtheing and endurance.    Personal Factors and Comorbidities Comorbidity 3+;Fitness    Comorbidities OP, COPD, Coronary artery disease, obesity, arthritis    Examination-Activity Limitations Bathing;Locomotion Level;Transfers;Bed Mobility;Caring for Others;Carry;Squat;Dressing;Stairs;Stand;Toileting;Lift;Sleep    Examination-Participation Restrictions Church;Cleaning;Meal Prep;Community Activity;Laundry;Shop;Volunteer;[employer] Work    PT Treatment/Interventions Aquatic Therapy;ADLs/Self Care Home Management;Moist Heat;Cryotherapy;Gait training;Stair training;Functional mobility training;Therapeutic activities;Therapeutic exercise;Balance training;Neuromuscular re-education;Manual techniques;Orthotic Fit/Training;Patient/family education;Dry needling;Passive range of motion;Energy conservation    PT Next Visit Plan hip flexbility, BKFO, lower extremity/core strengthening, gait training.    PT Home Exercise Plan Access Code: [email or web address]    Consulted and Agree with Plan of Care Patient             Patient will benefit from skilled therapeutic intervention in order to improve the following deficits and impairments:  Abnormal gait, Difficulty walking, Cardiopulmonary status  limiting activity, Decreased endurance, Obesity, Decreased activity tolerance, Decreased knowledge of precautions, Decreased skin integrity, Decreased balance, Pain, Decreased mobility, Decreased strength  Visit Diagnosis: Difficulty in walking, not elsewhere classified  Decreased functional mobility and endurance  Muscle weakness (generalized)  Chronic bilateral low back pain without sciatica     Problem List Patient Active Problem List   Diagnosis Date Noted   Gait abnormality 07/31/2020   Paresthesia 07/31/2020   Low back pain with bilateral sciatica 07/31/2020   Pneumonia due to COVID-19 virus    Bacteremia due to Gram-positive bacteria 08/29/2018   Abscess    Cellulitis of left breast 08/28/2018   Cellulitis of left abdominal wall 08/28/2018   Encounter for routine gynecological examination 04/28/2018   CKD (chronic kidney disease), stage III ([place]) 09/27/2017   Recurrent genital herpes 04/13/2017   Chronic pain disorder 01/25/2017   Malignant neoplasm of upper-outer quadrant of left breast in female, estrogen receptor positive ([place]) 12/16/2016   Chronic knee pain 12/07/2016   Chronic bilateral low back pain without sciatica 12/07/2016   Peripheral musculoskeletal gait disorder 12/07/2016   Encounter for therapeutic drug monitoring 06/16/2016   Generalized osteoarthritis of multiple sites 05/04/2016   Chronic anticoagulation 05/04/2016   Stroke ([place]) 04/14/2016   Aortic valve disease 04/10/2016   S/P AVR 04/10/2016   NICM (nonischemic cardiomyopathy) ([place]) 04/10/2016   Upper airway cough syndrome 03/14/2016   Morbid obesity ([place]) 03/14/2016   COPD (chronic obstructive pulmonary disease) ([place]) 03/11/2016   Thrombocytopenia ([place]) 12/12/2015   Anemia 12/12/2015   Vitamin D deficiency 12/12/2015   Acute  urinary retention 12/11/2015   Osteoporosis 12/11/2015   Hip fracture ([place]) 12/10/2015   Aortic atherosclerosis ([place]) 12/10/2015   Lung blebs ([place]) 12/03/2015    Pelvic fracture ([place]) 08/19/2015   Fall at home 08/19/2015   Essential hypertension 08/19/2015   Mixed hyperlipidemia 08/19/2015   Asthma 08/19/2015   Paroxysmal atrial fibrillation ([place]) 08/19/2015   Hyperthyroidism 08/19/2015   Chronic HFrEF (heart failure with reduced ejection fraction) (HCC)    Coronary artery disease    Aortic stenosis    Rich Number, PT, DPT, OCS, Crt. DN  Bethena Midget 09/25/2020, 10:17 AM  [facility] [address] [place], [place], [address] Phone: [phone]   Fax:  [phone]  Name: Danielle [name] MRN: [id number] Date of Birth: [birth year]

## 2020-09-26 NOTE — Patient Instructions (Signed)
Hi Krisan,  It was great speaking with you again! Below is a summary of some of the topics we discussed.   Please reach out to me if you have any questions or need anything before our follow up!  Best, Maddie  [name], PharmD, [facility]   Visit Information   Patient Care Plan: [facility]CM Pharmacy Care Plan     Problem Identified: Problem: Hypertension, Hyperlipidemia, Atrial Fibrillation, Heart Failure, COPD, Asthma, Chronic Kidney Disease, Osteoporosis and Breast cancer, chronic pain, insomnia, and hyperthyroidism      Long-Range Goal: Patient-Specific Goal   Start Date: 06/11/2020  Expected End Date: 06/11/2021  Recent Progress: On track  Priority: High  Note:   Current Barriers:  Unable to independently monitor therapeutic efficacy Unable to achieve control of cholesterol  Suboptimal therapeutic regimen for COPD/asthma  Pharmacist Clinical Goal(s):  Patient will verbalize ability to afford treatment regimen achieve adherence to monitoring guidelines and medication adherence to achieve therapeutic efficacy achieve control of cholesterol as evidenced by next lipid panel  through collaboration with PharmD and provider.   Interventions: 1:1 collaboration with [name], [name], MD regarding development and update of comprehensive plan of care as evidenced by provider attestation and co-signature Inter-disciplinary care team collaboration (see longitudinal plan of care) Comprehensive medication review performed; medication list updated in electronic medical record  Hypertension (BP goal <140/90) -Controlled -Current treatment: Carvedilol '25mg'$ , 1 tablet twice daily -Medications previously tried: none  -Current home readings: 120/70, nothing > 140 -Current dietary habits: limits salt intake -Current exercise habits: limited in ability to exercise -Denies hypotensive/hypertensive symptoms -Educated on  Importance of home blood pressure monitoring; Proper BP monitoring technique; Symptoms of hypotension and importance of maintaining adequate hydration; -Counseled to monitor BP at home weekly, document, and provide log at future appointments -Counseled on diet and exercise extensively Recommended to continue current medication  Hyperlipidemia/history of stroke: (LDL goal < 70) -Uncontrolled -Current treatment: Rosuvastatin '40mg'$ , 1 tablet once daily  Aspirin '81mg'$ , 1 tablet once daily  -Medications previously tried: none  -Current dietary patterns: sometimes fries food, no processed meats, no read meat and uses canolia oil -Current exercise habits: limited in ability -Educated on Cholesterol goals;  Benefits of statin for ASCVD risk reduction; Importance of limiting foods high in cholesterol; Exercise goal of 150 minutes per week; -Counseled on diet and exercise extensively Recommended to continue current medication Recommended repeat lipid panel and if LDL > 70, consider additional lipid lowering therapy  Heart Failure (Goal: manage symptoms and prevent exacerbations) -Uncontrolled -Last ejection fraction: 45-50% (Date: 11/21/19) -HF type: Systolic -NYHA Class: III (marked limitation of activity) -AHA HF Stage: C (Heart disease and symptoms present) -Current treatment: Entresto 49-51 mg 1 tablet twice daily Torsemide 20 mg 1 tablet daily Carvedilol '25mg'$ , 1 tablet twice daily Farxiga 10 mg, 1 tablet daily -Medications previously tried: losartan  -Current home BP/HR readings: never > 140 -Current dietary habits: limits salt intake; drinks only 2 bottles of water and a cup of coffee daily -Current exercise habits: limited in ability; [facility] PT [facility] -Educated on Importance of weighing daily; if you gain more than 3 pounds in one day or 5 pounds in one week, call cardiologist Proper diuretic administration and potassium  supplementation Importance of blood pressure control -Recommended to continue current medication Assessed patient finances. Patient is receiving Entresto through [employer] and applied for [place] patient assistance already.  Atrial Fibrillation (Goal: prevent stroke and major bleeding) -Controlled -CHADSVASC: 7 -Current treatment: Rate control: carvedilol 25 mg 1 tablet twice daily Anticoagulation: warfarin as directed by coumadin clinic  -Medications previously tried: none -Home BP and HR readings: nothing > 140  -Counseled on bleeding risk associated with warfarin and importance of self-monitoring for signs/symptoms of bleeding; avoidance of NSAIDs due to increased bleeding risk with anticoagulants; -Recommended to continue current medication Educated on the differences between Eliquis and warfarin as patient inquired.  COPD/asthma (Goal: control symptoms and prevent exacerbations) -Uncontrolled -Current treatment  Albuterol HFA inhaler, 2 puffs every six hours as needed for whezing or shortness of breath  Symbicort 160-4.55 mcg/act inhale 2 puffs twice daily - does not use or have -Medications previously tried: Dentist (switched to Symbicort) -Gold Grade: Gold 1 (FEV1>80%) -Current COPD Classification:  B (high sx, <2 exacerbations/yr) -MMRC/CAT score: n/a -Pulmonary function testing: n/a -Exacerbations requiring treatment in last 6 months: none -Patient denies consistent use of maintenance inhaler (only using once daily) -Frequency of rescue inhaler use: regularly -Counseled on Proper inhaler technique; Benefits of consistent maintenance inhaler use When to use rescue inhaler Differences between maintenance and rescue inhalers -Recommended to continue current medication Assessed patient finances. Will plan to apply for PAP for Symbicort.  Osteoporosis (Goal prevent fractures) -Controlled -Last DEXA Scan: 11/29/2018   T-Score femoral neck: -2.8  T-Score total hip:  n/a  T-Score lumbar spine: n/a  T-Score forearm radius: -1.7  10-year probability of major osteoporotic fracture: n/a  10-year probability of hip fracture: n/a -Patient is a candidate for pharmacologic treatment due to history of hip fracture  and T-Score < -2.5 in femoral neck -Current treatment  Alendronate '70mg'$ , 1 tablet once a week. (Saturday) - started 2017  Vitamin D 1000 units, 1 tablet once daily -Medications previously tried: none  -Recommend [phone] units of vitamin D daily. Recommend 1200 mg of calcium daily from dietary and supplemental sources. Counseled on oral bisphosphonate administration: take in the morning, 30 minutes prior to food with 6-8 oz of water. Do not lie down for at least 30 minutes after taking. Recommend weight-bearing and muscle strengthening exercises for building and maintaining bone density. -Recommended to continue current medication Educated on drug holiday after repeat bone density scan  Chronic pain (Goal: minimize symptoms) -Controlled -Current treatment  Diclofenac 1%gel, apply 4g four times daily  Aspercreme- as needed  APAP '500mg'$ , 2 tablet daily as needed for pain  Tramadol '50mg'$ , 1 tablet three times daily  -Medications previously tried: none  -Recommended to continue current medication  Hyperthyroidism (Goal: TSH 0.35-4.5) -Controlled -Current treatment  Methimazole '5mg'$ , 3 tablets every evening -Medications previously tried: none  -Recommended to continue current medication  Breast cancer (Goal: remission) -Controlled -Current treatment  Tamoxifen '20mg'$ , 1 tablet once daily (started 2019) -Medications previously tried: none  -Recommended to continue current medication  Insomnia (Goal: improve quality and quantity of sleep) -Not ideally controlled -Current treatment  Diphenhydramine- APAP 25-'500mg'$ , 2 tablet at bedtime  Melatonin '10mg'$ , 1 tablet at bedtime -Medications previously tried: none  -Counseled on avoidance of  diphenhydramine or Benadryl containing products due to risk of falls. Recommended trial of extended release melatonin.  GERD (Goal: minimize symptoms) -Controlled -Current treatment  Pantoprazole 40 mg 1 tablet daily as needed -Medications previously tried: none  -Recommended to continue current medication   Health Maintenance -Vaccine gaps: none -Current therapy:  Senokot-S 8.6-50 mg 2 tablets at bedtime Valacyclovir 500 mg 1 tablet twice daily as needed for outbreaks -  Educated on Cost vs benefit of each product must be carefully weighed by individual consumer -Patient is satisfied with current therapy and denies issues -Recommended to continue current medication  Patient Goals/Self-Care Activities Patient will:  - take medications as prescribed check blood pressure weekly, document, and provide at future appointments weigh daily, and contact provider if weight gain of > 3 lbs in one day or > 5 lbs in one week engage in dietary modifications by limiting fried foods  Follow Up Plan: Telephone follow up appointment with care management team member scheduled for: 4 months       Patient verbalizes understanding of instructions provided today and agrees to view in [place].  The pharmacy team will reach out to the patient again over the next 30 days.   Viona Gilmore, [facility]

## 2020-09-27 ENCOUNTER — Other Ambulatory Visit: Payer: Self-pay

## 2020-09-27 ENCOUNTER — Ambulatory Visit: Payer: Medicare Other

## 2020-09-27 DIAGNOSIS — R262 Difficulty in walking, not elsewhere classified: Secondary | ICD-10-CM | POA: Diagnosis not present

## 2020-09-27 DIAGNOSIS — M25562 Pain in left knee: Secondary | ICD-10-CM | POA: Diagnosis not present

## 2020-09-27 DIAGNOSIS — Z7409 Other reduced mobility: Secondary | ICD-10-CM

## 2020-09-27 DIAGNOSIS — M6281 Muscle weakness (generalized): Secondary | ICD-10-CM | POA: Diagnosis not present

## 2020-09-27 DIAGNOSIS — R2689 Other abnormalities of gait and mobility: Secondary | ICD-10-CM

## 2020-09-27 DIAGNOSIS — M25561 Pain in right knee: Secondary | ICD-10-CM | POA: Diagnosis not present

## 2020-09-27 DIAGNOSIS — G8929 Other chronic pain: Secondary | ICD-10-CM

## 2020-09-27 DIAGNOSIS — R293 Abnormal posture: Secondary | ICD-10-CM

## 2020-09-27 DIAGNOSIS — M545 Low back pain, unspecified: Secondary | ICD-10-CM | POA: Diagnosis not present

## 2020-09-27 NOTE — Therapy (Signed)
[place], [place], [address] Phone: [phone]   Fax:  [phone]  Physical Therapy Treatment  Patient Details  Name: Danielle [name] MRN: [id number] Date of Birth: 23-Mar-1945 Referring Provider (PT): Gar Ponto, MD   Encounter Date: 09/27/2020   PT End of Session - 09/27/20 0825     Visit Number 9    Number of Visits 24    Date for PT Re-Evaluation 11/11/20    Authorization Type [employer] - [place] Time Period 10th visit FOTO/reval    Progress Note Due on Visit 10    PT Start Time 0815    PT Stop Time 0900    PT Time Calculation (min) 45 min    Equipment Utilized During Treatment Other (comment)   rollator   Activity Tolerance Patient tolerated treatment well;Patient limited by fatigue;Patient limited by pain    Behavior During Therapy [facility] for tasks assessed/performed             Past Medical History:  Diagnosis Date   Allergy    Anxiety    Aortic stenosis    Status post bioprosthetic AVR   Arthritis    DJD   Asthma    Breast cancer ([place]) 12/13/2016   Left breast   Chronic systolic CHF (congestive heart failure) ([place])    EF 30% 04/2014   COPD (chronic obstructive pulmonary disease) ([place])    Coronary artery disease    per pt, had LHC prior to AVR in [place] 2016 that did not show any blockages; no stents/bypass   Gastritis    Hyperlipidemia    Hypertension    Hyperthyroidism    Multiple thyroid nodules    Osteoporosis    Paroxysmal atrial fibrillation ([place])    Pelvis fracture ([place]) 08/19/2015   MULTIPLE    Personal history of radiation therapy 2018   Spontaneous pneumothorax 11/28/2015   left    Stroke ([place]) 2000   rt hand weak    Past Surgical History:  Procedure Laterality Date   BREAST LUMPECTOMY Left 12/2016   BREAST LUMPECTOMY WITH RADIOACTIVE SEED AND SENTINEL LYMPH NODE BIOPSY Left 01/14/2017   Procedure: LEFT BREAST LUMPECTOMY WITH  RADIOACTIVE SEED AND SENTINEL LYMPH NODE BIOPSY;  Surgeon: Rolm Bookbinder, MD;  Location: [place];  Service: General;  Laterality: Left;   CARDIAC CATHETERIZATION     CHEST TUBE INSERTION  10/2015   INTRAMEDULLARY (IM) NAIL INTERTROCHANTERIC Left 12/10/2015   Procedure: INTRAMEDULLARY (IM) NAIL INTERTROCHANTRIC;  Surgeon: Leandrew Koyanagi, MD;  Location: [place];  Service: Orthopedics;  Laterality: Left;   PLEURADESIS Left 12/03/2015   Procedure: PLEURADESIS;  Surgeon: Melrose Nakayama, MD;  Location: [place];  Service: Thoracic;  Laterality: Left;   RESECTION OF APICAL BLEB Left 12/03/2015   Procedure: BLEBECTOMY;  Surgeon: Melrose Nakayama, MD;  Location: [place];  Service: Thoracic;  Laterality: Left;   RIGHT/LEFT HEART CATH AND CORONARY ANGIOGRAPHY N/A 03/10/2020   Procedure: RIGHT/LEFT HEART CATH AND CORONARY ANGIOGRAPHY;  Surgeon: [name], MD;  Location: [place] [facility];  Service: Cardiovascular;  Laterality: N/A;   THORACOSCOPY  12/03/2015   VALVE REPLACEMENT     VIDEO ASSISTED THORACOSCOPY Left 12/03/2015   Procedure: VIDEO ASSISTED THORACOSCOPY;  Surgeon: Melrose Nakayama, MD;  Location: [place];  Service: Thoracic;  Laterality: Left;    There were no vitals filed for this visit.   Subjective Assessment - 09/27/20 0822     Subjective Pt  reports her R knee is hurting alot today. Pt reports heat helps her knee pain the most. Pt notes she is able to get in and out of bed without assistance from her husband.    Pertinent History OP, COPD, pneumothorax, arthritis, Obesity, [place] Artery disease, breast cancer    Limitations Lifting;Standing;Walking;House hold activities    How long can you sit comfortably? an hour or more ,  Difficulty with standing    Patient Stated Goals To be able to walk with a cane    Currently in Pain? Yes    Pain Score 7     Pain Location Knee    Pain Orientation Right    Pain Descriptors / Indicators Aching    Pain Type Chronic pain    Pain  Onset More than a month ago    Pain Frequency Intermittent                               [facility] Adult PT Treatment/Exercise - 09/27/20 0001       Ambulation/Gait   Ambulation/Gait Yes    Ambulation/Gait Assistance 5: Supervision    Ambulation Distance (Feet) 100 Feet    Assistive device 4-wheeled walker    Gait Pattern Step-through pattern;Decreased step length - left;Decreased step length - right;Poor foot clearance - left      Lumbar Exercises: Aerobic   Nustep L2 x 5 min  LE only      Knee/Hip Exercises: Seated   Long Arc Quad Right;Left;2 sets;20 reps      Knee/Hip Exercises: Supine   Bridges Both;2 sets;10 reps    Straight Leg Raises Right;Left;2 sets;10 reps    Other Supine Knee/Hip Exercises L and R, single leg clams, 10x2, green Tband                      PT Short Term Goals - 09/16/20 0948       PT SHORT TERM GOAL #1   Title The patient will be independent in an initial HEP of sitting exercises    Baseline met    Time 3    Period Weeks    Status Achieved    Target Date 09/09/20               PT Long Term Goals - 09/16/20 0948       PT LONG TERM GOAL #1   Title The patient will be able to transfer sit to stand x 5 reps without use of hands in < or equal to 18 seconds.    Baseline 5 x STS 43.34 sec  ( risk of fall 15 sec or more) Pt must use hands    Time 12    Period Weeks    Status On-going      PT LONG TERM GOAL #2   Title The patient will be able to ambulate x 200 without SOB and use of FWW for community ambulation.    Baseline Pt with SOB walking 120 feet today    Time 12    Period Weeks    Status On-going      PT LONG TERM GOAL #3   Title The patient will improve FOTO outcome score to 46% for improvement of functional home activities.    Baseline 30% eval    Time 12    Period Weeks    Status On-going      PT LONG TERM GOAL #4  Title The patient will be able to stand for 20 minutes with low back pain  under or equal to 3/10 and use of hands for support    Baseline unable to stand without use of hands and pain 7/10    Time 12    Period Weeks    Status On-going      PT LONG TERM GOAL #5   Title Pt will gross LE MMT to 4+/5 for improved strength.    Baseline see flow chart MMT    Time 8    Period Weeks    Status On-going                   Plan - 09/27/20 0857     Clinical Impression Statement Pt reports 7/10 R knee pain with weight bearing when walking and that the pain decreases to 2/10 in sitting and supine. PT was continued for strengthening the LEs in sitting and supine. Pt notes functional improvement in that she able to get in and out of bed on her own. Pt continues to exhibit DOE with most activities. Pt tolerated today's session without adverse effects.    Personal Factors and Comorbidities Comorbidity 3+;Fitness    Comorbidities OP, COPD, Coronary artery disease, obesity, arthritis    Examination-Activity Limitations Bathing;Locomotion Level;Transfers;Bed Mobility;Caring for Others;Carry;Squat;Dressing;Stairs;Stand;Toileting;Lift;Sleep    Examination-Participation Restrictions Church;Cleaning;Meal Prep;Community Activity;Laundry;Shop;Volunteer;Yard Work    Merchant navy officer Evolving/Moderate complexity    Clinical Decision Making Moderate    Rehab Potential Good    PT Frequency 2x / week    PT Duration 12 weeks    PT Treatment/Interventions Aquatic Therapy;ADLs/Self Care Home Management;Moist Heat;Cryotherapy;Gait training;Stair training;Functional mobility training;Therapeutic activities;Therapeutic exercise;Balance training;Neuromuscular re-education;Manual techniques;Orthotic Fit/Training;Patient/family education;Dry needling;Passive range of motion;Energy conservation    PT Next Visit Plan hip flexbility, BKFO, lower extremity/core strengthening, gait training.    PT Home Exercise Plan Access Code: [email or web address]    Consulted and Agree with Plan of Care  Patient             Patient will benefit from skilled therapeutic intervention in order to improve the following deficits and impairments:  Abnormal gait, Difficulty walking, Cardiopulmonary status limiting activity, Decreased endurance, Obesity, Decreased activity tolerance, Decreased knowledge of precautions, Decreased skin integrity, Decreased balance, Pain, Decreased mobility, Decreased strength  Visit Diagnosis: Difficulty in walking, not elsewhere classified  Decreased functional mobility and endurance  Muscle weakness (generalized)  Chronic bilateral low back pain without sciatica  Abnormal posture  Chronic pain of right knee  Peripheral musculoskeletal gait disorder  Chronic pain of left knee     Problem List Patient Active Problem List   Diagnosis Date Noted   Gait abnormality 07/31/2020   Paresthesia 07/31/2020   Low back pain with bilateral sciatica 07/31/2020   Pneumonia due to COVID-19 virus    Bacteremia due to Gram-positive bacteria 08/29/2018   Abscess    Cellulitis of left breast 08/28/2018   Cellulitis of left abdominal wall 08/28/2018   Encounter for routine gynecological examination 04/28/2018   CKD (chronic kidney disease), stage III ([place]) 09/27/2017   Recurrent genital herpes 04/13/2017   Chronic pain disorder 01/25/2017   Malignant neoplasm of upper-outer quadrant of left breast in female, estrogen receptor positive ([place]) 12/16/2016   Chronic knee pain 12/07/2016   Chronic bilateral low back pain without sciatica 12/07/2016   Peripheral musculoskeletal gait disorder 12/07/2016   Encounter for therapeutic drug monitoring 06/16/2016   Generalized osteoarthritis of multiple sites 05/04/2016   Chronic anticoagulation 05/04/2016  Stroke ([place]) 04/14/2016   Aortic valve disease 04/10/2016   S/P AVR 04/10/2016   NICM (nonischemic cardiomyopathy) ([place]) 04/10/2016   Upper airway cough syndrome 03/14/2016   Morbid obesity ([place]) 03/14/2016   COPD  (chronic obstructive pulmonary disease) ([name]) 03/11/2016   Thrombocytopenia ([place]) 12/12/2015   Anemia 12/12/2015   Vitamin D deficiency 12/12/2015   Acute urinary retention 12/11/2015   Osteoporosis 12/11/2015   Hip fracture ([place]) 12/10/2015   Aortic atherosclerosis ([place]) 12/10/2015   Lung blebs ([place]) 12/03/2015   Pelvic fracture ([place]) 08/19/2015   Fall at home 08/19/2015   Essential hypertension 08/19/2015   Mixed hyperlipidemia 08/19/2015   Asthma 08/19/2015   Paroxysmal atrial fibrillation ([place]) 08/19/2015   Hyperthyroidism 08/19/2015   Chronic HFrEF (heart failure with reduced ejection fraction) ([place])    Coronary artery disease    Aortic stenosis     Gar Ponto MS, PT 09/27/20 9:23 AM   [facility] [address] [place], [place], [address] Phone: [phone]   Fax:  [phone]  Name: Danielle [name] MRN: [id number] Date of Birth: October 31, 1945

## 2020-09-30 ENCOUNTER — Other Ambulatory Visit: Payer: Self-pay

## 2020-09-30 ENCOUNTER — Ambulatory Visit: Payer: Medicare Other | Attending: Family Medicine

## 2020-09-30 DIAGNOSIS — R293 Abnormal posture: Secondary | ICD-10-CM | POA: Diagnosis not present

## 2020-09-30 DIAGNOSIS — M6281 Muscle weakness (generalized): Secondary | ICD-10-CM | POA: Diagnosis not present

## 2020-09-30 DIAGNOSIS — G8929 Other chronic pain: Secondary | ICD-10-CM

## 2020-09-30 DIAGNOSIS — R2689 Other abnormalities of gait and mobility: Secondary | ICD-10-CM

## 2020-09-30 DIAGNOSIS — M25561 Pain in right knee: Secondary | ICD-10-CM | POA: Diagnosis not present

## 2020-09-30 DIAGNOSIS — R262 Difficulty in walking, not elsewhere classified: Secondary | ICD-10-CM

## 2020-09-30 DIAGNOSIS — M25562 Pain in left knee: Secondary | ICD-10-CM | POA: Diagnosis not present

## 2020-09-30 NOTE — Progress Notes (Signed)
PCP: [name], [name], MD Cardiology: Dr. Saunders Revel HF cardiology: Dr. Aundra Dubin  75 y.o. with history of aortic insufficiency s/p bioprosthetic aortic valve and CHF was referred by Dr End for evaluation of CHF.  Patient had bioprosthetic aortic valve replacement for aortic insufficiency in 1/16 at [facility] in [place], [place].  Since that time, she has had mild to moderately reduced LV systolic function, as low as 30-35% and as high at 45-50%.  Most recent echo in 9/21 showed EF 45-50%.  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  She has had paroxysmal atrial fibrillation and 2 prior CVAs.  She is on warfarin.  She has had breast cancer treated with lumpectomy and radiation, no chemotherapy.  She has history of hyperthyroidism and is on methimazole.  She has controlled HTN.    For > 1 year, she has had significant exertional dyspnea.  Currently, she is short of breath walking "20 steps."  She has 3 pillow orthopnea and bendopnea.  No chest pain.  No lightheadedness.  No palpitations, she is in NSR today by exam.  She uses a walker for balance.  She is only taking Lasix 20 mg daily.  She does report tingling in her hands and feet that could be consistent with peripheral neuropathy.   Today she returns for HF follow up. Still SOB with minimal activity, arrived in [facility] but uses a walker at home. Denies CP, edema. + 3 pillow PND/Orthopnea and benopnea. Some dizziness but says she has vertigo. Appetite ok. No fever or chills. Weight at home 250-252 pounds. Taking all medications. BP at home 110s/70s. Thinks she has a yeast infection.  REDS clip 43%  ECG (personally reviewed): NSR, 1st degree AVB, PAC   Labs (5/22): K 4.9, creatinine 1.21 Labs (7/22): K 4.0, creatinine 1.60  PMH: 1. Left breast cancer: Treated with lumpectomy and radiation, no chemotherapy.  2. Hyperthyroidism 3. Aortic insufficiency: Severe, found when she lived in [place].  She had bioprosthetic aortic valve replacement at  [facility] in 1/16.  4. Atrial fibrillation: Paroxysmal. 5. CVA x 2 6. HTN 7. Hyperlipidemia 8. Chronic systolic CHF: Nonischemic cardiomyopathy.  - Echo (3/16): EF 45-50% - Echo (1/17): EF 30-35% - Echo (2/18): EF 35-40% - Echo (11/20): EF 35-40% - Echo (9/21): EF 45-50%, global hypokinesis, mild LVH, bioprosthetic aortic valve mean gradient 10 mmHg, normal RV - RHC/LHC (1/22): mean RA 9, PA 35/20, mean PCWP 21, CI 3.2.  Nonobstructive CAD.  9. H/o spontaneous PTX  Social History   Socioeconomic History   Marital status: Married    Spouse name: Sherwood   Number of children: 0   Years of education: 12   Highest education level: 12th grade  Occupational History   Occupation: Retired in 2004  Tobacco Use   Smoking status: Former    Packs/day: 0.25    Years: 10.00    Pack years: 2.50    Types: Cigarettes    Quit date: 03/01/2010    Years since quitting: 10.5   Smokeless tobacco: Never  Vaping Use   Vaping Use: Never used  Substance and Sexual Activity   Alcohol use: No   Drug use: No   Sexual activity: Yes    Birth control/protection: Post-menopausal  Other Topics Concern   Not on file  Social History Narrative   Lives with husband.  Ambulated independently.   Right-handed.   No daily caffeine use.   Social Determinants of Health   Financial Resource Strain: Medium Risk  Difficulty of Paying Living Expenses: Somewhat hard  Food Insecurity: No Food Insecurity   Worried About Running Out of Food in the Last Year: Never true   Ran Out of Food in the Last Year: Never true  Transportation Needs: No Transportation Needs   Lack of Transportation (Medical): No   Lack of Transportation (Non-Medical): No  Physical Activity: Sufficiently Active   Days of Exercise per Week: 7 days   Minutes of Exercise per Session: 30 min  Stress: No Stress Concern Present   Feeling of Stress : Only a little  Social Connections: Moderately Integrated   Frequency of Communication  with Friends and Family: More than three times a week   Frequency of Social Gatherings with Friends and Family: More than three times a week   Attends Religious Services: More than 4 times per year   Active Member of [employer] or Organizations: No   Attends Music therapist: Never   Marital Status: Married  Human resources officer Violence: Not At Risk   Fear of Current or Ex-Partner: No   Emotionally Abused: No   Physically Abused: No   Sexually Abused: No   Family History  Problem Relation Age of Onset   Diabetes Mother    Heart attack Mother 48   Diabetes Father    Lung cancer Father    Diabetes Sister    Thyroid disease Sister    Diabetes Sister    HIV Brother    ROS: All systems reviewed and negative except as per HPI.   Current Outpatient Medications  Medication Sig Dispense Refill   albuterol (PROVENTIL HFA;VENTOLIN HFA) 108 (90 Base) MCG/ACT inhaler Inhale 2 puffs into the lungs every 6 (six) hours as needed for wheezing or shortness of breath.      alendronate (FOSAMAX) 70 MG tablet Take 1 tablet (70 mg total) by mouth once a week. Take with a full glass of water on an empty stomach. 13 tablet 3   ascorbic acid (VITAMIN C) 500 MG tablet Take 500 mg by mouth daily.     budesonide-formoterol (SYMBICORT) 160-4.5 MCG/ACT inhaler Inhale 2 puffs into the lungs daily as needed.     carvedilol (COREG) 25 MG tablet Take 25 mg by mouth 2 (two) times daily with a meal.     Cholecalciferol 25 MCG (1000 UT) tablet Take 1,000 Units by mouth daily.     dapagliflozin propanediol (FARXIGA) 10 MG TABS tablet Take 1 tablet (10 mg total) by mouth daily before breakfast. 30 tablet 3   diclofenac sodium (VOLTAREN) 1 % GEL Apply 4 g topically 4 (four) times daily. 500 g 3   diphenhydramine-acetaminophen (TYLENOL PM) 25-500 MG TABS tablet Take 2 tablets by mouth at bedtime.     fluticasone (FLONASE) 50 MCG/ACT nasal spray Place 1 spray into both nostrils daily. 16 g 3   methimazole (TAPAZOLE)  5 MG tablet Take 3 tablets (15 mg total) by mouth daily. 270 tablet 2   rosuvastatin (CRESTOR) 40 MG tablet TAKE 1 TABLET BY MOUTH  DAILY 90 tablet 3   sacubitril-valsartan (ENTRESTO) 49-51 MG Take 1 tablet by mouth 2 (two) times daily.     senna-docusate (SENOKOT-S) 8.6-50 MG tablet Take 2 tablets at bedtime by mouth.      tamoxifen (NOLVADEX) 20 MG tablet Take 1 tablet (20 mg total) by mouth daily. 90 tablet 3   torsemide (DEMADEX) 20 MG tablet Take 1 tablet (20 mg total) by mouth every other day. 90 tablet 3   traMADol (  ULTRAM) 50 MG tablet Take 1 tablet (50 mg total) by mouth 3 (three) times daily. 90 tablet 5   trolamine salicylate (ASPERCREME) 10 % cream Apply 1 application topically as needed for muscle pain.     valACYclovir (VALTREX) 500 MG tablet Take 500 mg by mouth 2 (two) times daily as needed.     warfarin (COUMADIN) 3 MG tablet Take 1 tablet daily or Take as directed by anticoagulation clinic 90 tablet 1   No current facility-administered medications for this encounter.   Wt Readings from Last 3 Encounters:  10/01/20 116.6 kg (257 lb)  09/16/20 111.1 kg (245 lb)  09/05/20 118.8 kg (262 lb)   BP 100/62   Pulse 78   Wt 116.6 kg (257 lb)   SpO2 97%   BMI 40.25 kg/m  General:  NAD. SOB speaking in full sentences, arrived in [facility] HEENT: Normal Neck: Supple. JVP 8-9. Carotids 2+ bilat; no bruits. No lymphadenopathy or thryomegaly appreciated. Cor: PMI nondisplaced. Regular rate & rhythm. No rubs, gallops or murmurs. Lungs: Markedly diminished BS in bases. Abdomen: Obese, nontender, nondistended. No hepatosplenomegaly. No bruits or masses. Good bowel sounds. Extremities: No cyanosis, clubbing, rash, 1+ pretibial LE edema Neuro: Alert & oriented x 3, cranial nerves grossly intact. Moves all 4 extremities w/o difficulty. Affect pleasant.  Assessment/Plan: 1. Chronic HF with mid range EF: Nonischemic cardiomyopathy.  Echo in 9/21 showed EF 45-50%, global hypokinesis, mild LVH,  bioprosthetic aortic valve mean gradient 10 mmHg, normal RV.  Cause of cardiomyopathy is uncertain, mild CAD on 1/22 cath.  It is possible that this is chronic effect of long-standing AI that was not improved with valve replacement.  Also consider long-standing HTN or possibly cardiac amyloidosis (she has LVH and symptoms of peripheral neuropathy).  Her cardiac symptoms are quite impressive, NYHA class IIIb.  She seems significantly limited though some of this may be from deconditioning. Her weight is down. On exam, she is at least mildly volume overloaded, REDS clip 43%.   - Cardiac MRI has been arranged to assess for evidence for cardiac amyloidosis, myocarditis, etc.  - Change torsemide to 20 mg daily.  Previously on every other day dosing. BMET today. - Continue Farxiga 10 mg daily.  - Continue Coreg 25 mg bid.  - Continue Entresto 49/51 bid.  - If K remains stable, can eventually add spironolactone. BP too low today. - Would benefit from exercise regimen, ?cardiac rehab (but not sure this will be covered without lower EF).  - Will send her for CXR with markedly diminished basilar breath sounds and history of spontaneous PTX; oxygen sats stable 97% on room air. 2. Aortic valve disorders: Per report, had severe AI.  Now s/p bioprosthetic aortic valve replacement.  The valve was stable on 9/21 echo.  - Will need antibiotic prophylaxis with dental work.  3. HTN: BP controlled on current regimen.  4. Atrial fibrillation: Paroxysmal.  She is in NSR today, rare palpitations.   - No ASA given warfarin use. INR checked by PCP. - Continue warfarin for now, can consider DOAC in future.  Current data suggests DOAC is reasonable in setting of bioprosthetic valve.  5. Vulvovaginal candidiasis: Will give fluconazole 150 mg x 1. May need to stop [place] if reoccurs.  Followup in 2 wks with NP/PA to reassess volume status.   [place] FNP 10/01/2020

## 2020-09-30 NOTE — Therapy (Signed)
[place], [place], [address] Phone: [phone]   Fax:  [phone]  Physical Therapy Treatment/Progress Note  Patient Details  Name: Danielle [name] MRN: [id number] Date of Birth: [birth year] Referring Provider (PT): Gar Ponto, MD  Progress Note Reporting Period 08/19/20 to 09/30/20  See note below for Objective Data and Assessment of Progress/Goals.      Encounter Date: 09/30/2020   PT End of Session - 09/30/20 0843     Visit Number 10    Number of Visits 24    Date for PT Re-Evaluation 11/11/20    Authorization Time Period 10th visit FOTO/reval    Progress Note Due on Visit 10    PT Start Time 0805    PT Stop Time 0847    PT Time Calculation (min) 42 min    Equipment Utilized During Treatment Other (comment)   rollator   Activity Tolerance Patient tolerated treatment well;Patient limited by fatigue;Patient limited by pain    Behavior During Therapy [facility] for tasks assessed/performed             Past Medical History:  Diagnosis Date   Allergy    Anxiety    Aortic stenosis    Status post bioprosthetic AVR   Arthritis    DJD   Asthma    Breast cancer ([place]) 12/13/2016   Left breast   Chronic systolic CHF (congestive heart failure) ([place])    EF 30% 04/2014   COPD (chronic obstructive pulmonary disease) ([place])    Coronary artery disease    per pt, had LHC prior to AVR in [place] 2016 that did not show any blockages; no stents/bypass   Gastritis    Hyperlipidemia    Hypertension    Hyperthyroidism    Multiple thyroid nodules    Osteoporosis    Paroxysmal atrial fibrillation ([place])    Pelvis fracture ([place]) 08/19/2015   MULTIPLE    Personal history of radiation therapy 2018   Spontaneous pneumothorax 11/28/2015   left    Stroke ([place]) 2000   rt hand weak    Past Surgical History:  Procedure Laterality Date   BREAST LUMPECTOMY Left 12/2016   BREAST LUMPECTOMY WITH  RADIOACTIVE SEED AND SENTINEL LYMPH NODE BIOPSY Left 01/14/2017   Procedure: LEFT BREAST LUMPECTOMY WITH RADIOACTIVE SEED AND SENTINEL LYMPH NODE BIOPSY;  Surgeon: Rolm Bookbinder, MD;  Location: [place];  Service: General;  Laterality: Left;   CARDIAC CATHETERIZATION     CHEST TUBE INSERTION  10/2015   INTRAMEDULLARY (IM) NAIL INTERTROCHANTERIC Left 12/10/2015   Procedure: INTRAMEDULLARY (IM) NAIL INTERTROCHANTRIC;  Surgeon: Leandrew Koyanagi, MD;  Location: [place];  Service: Orthopedics;  Laterality: Left;   PLEURADESIS Left 12/03/2015   Procedure: PLEURADESIS;  Surgeon: Melrose Nakayama, MD;  Location: [place];  Service: Thoracic;  Laterality: Left;   RESECTION OF APICAL BLEB Left 12/03/2015   Procedure: BLEBECTOMY;  Surgeon: Melrose Nakayama, MD;  Location: [place];  Service: Thoracic;  Laterality: Left;   RIGHT/LEFT HEART CATH AND CORONARY ANGIOGRAPHY N/A 03/10/2020   Procedure: RIGHT/LEFT HEART CATH AND CORONARY ANGIOGRAPHY;  Surgeon: [name], MD;  Location: [place] CV LAB;  Service: Cardiovascular;  Laterality: N/A;   THORACOSCOPY  12/03/2015   VALVE REPLACEMENT     VIDEO ASSISTED THORACOSCOPY Left 12/03/2015   Procedure: VIDEO ASSISTED THORACOSCOPY;  Surgeon: Melrose Nakayama, MD;  Location: [place];  Service: Thoracic;  Laterality: Left;    There were no vitals filed for  this visit.   Subjective Assessment - 09/30/20 0814     Subjective Pt reports her R kne pain is better than it was with the last PT session.    Pertinent History OP, COPD, pneumothorax, arthritis, Obesity, Cornary Artery disease, breast cancer    Limitations Lifting;Standing;Walking;House hold activities    How long can you sit comfortably? an hour or more ,  Difficulty with standing    Currently in Pain? Yes    Pain Score 5     Pain Location Knee    Pain Orientation Right    Pain Descriptors / Indicators Aching    Pain Type Chronic pain    Pain Onset More than a month ago    Pain Frequency  Intermittent    Aggravating Factors  standing, walking, lifting, putting on shoes    Pain Relieving Factors sitting and medication                               [facility] Adult PT Treatment/Exercise - 09/30/20 0001       Lumbar Exercises: Aerobic   Nustep L3 x 5 min  LE only      Knee/Hip Exercises: Aerobic   Nustep --      Knee/Hip Exercises: Standing   Hip Abduction Right;Left;10 reps    Abduction Limitations side steps, bar at freemotion.Increased R knee pain. DCed due to increase in R knee pain.    Other Standing Knee Exercises Standing on airex with bar at freemotion. Dced due to increase in r knee pain      Knee/Hip Exercises: Seated   Long Arc Quad Right;Left;2 sets;20 reps    Marching Right;Left;2 sets;10 reps    Abduction/Adduction  Right;Left;2 sets;10 reps    Abd/Adduction Limitations green Tband for abd, ball squeeze for add sets                      PT Short Term Goals - 09/16/20 0948       PT SHORT TERM GOAL #1   Title The patient will be independent in an initial HEP of sitting exercises    Baseline met    Time 3    Period Weeks    Status Achieved    Target Date 09/09/20               PT Long Term Goals - 09/16/20 0948       PT LONG TERM GOAL #1   Title The patient will be able to transfer sit to stand x 5 reps without use of hands in < or equal to 18 seconds.    Baseline 5 x STS 43.34 sec  ( risk of fall 15 sec or more) Pt must use hands    Time 12    Period Weeks    Status On-going      PT LONG TERM GOAL #2   Title The patient will be able to ambulate x 200 without SOB and use of FWW for community ambulation.    Baseline Pt with SOB walking 120 feet today    Time 12    Period Weeks    Status On-going      PT LONG TERM GOAL #3   Title The patient will improve FOTO outcome score to 46% for improvement of functional home activities.    Baseline 30% eval    Time 12    Period Weeks    Status  On-going      PT  LONG TERM GOAL #4   Title The patient will be able to stand for 20 minutes with low back pain under or equal to 3/10 and use of hands for support    Baseline unable to stand without use of hands and pain 7/10    Time 12    Period Weeks    Status On-going      PT LONG TERM GOAL #5   Title Pt will gross LE MMT to 4+/5 for improved strength.    Baseline see flow chart MMT    Time 8    Period Weeks    Status On-going                   Plan - 09/30/20 2800     Clinical Impression Statement PT was continued for LE strengthening and activity tolerance. Attempted 2 exercises in standing with hip abd and standing on the airex. With both exs the pt reported significant R knee pain. Ther ex for strengthening were then completed in sitting. With re-assessment of FOTO, the pt's score was similar to her last assessement, however the score did decrease by 3 points. Pt's subjective report indicates better function with her walking from the parking lot into the facility, getting in/out of bed unassisted, and her not returning to bed due to pain after being up. Pt's R knee pain level continues to be significant, but pt is making gains in function.    Personal Factors and Comorbidities Comorbidity 3+;Fitness    Comorbidities OP, COPD, Coronary artery disease, obesity, arthritis    Examination-Activity Limitations Bathing;Locomotion Level;Transfers;Bed Mobility;Caring for Others;Carry;Squat;Dressing;Stairs;Stand;Toileting;Lift;Sleep    Examination-Participation Restrictions Church;Cleaning;Meal Prep;Community Activity;Laundry;Shop;Volunteer;Yard Work    [employer]chant [employer] officer Evolving/Moderate complexity    Clinical Decision Making Moderate    Rehab Potential Good    PT Frequency 2x / week    PT Duration 8 weeks    PT Treatment/Interventions Aquatic Therapy;ADLs/Self Care Home Management;Moist Heat;Cryotherapy;Gait training;Stair training;Functional mobility training;Therapeutic  activities;Therapeutic exercise;Balance training;Neuromuscular re-education;Manual techniques;Orthotic Fit/Training;Patient/family education;Dry needling;Passive range of motion;Energy conservation    PT Next Visit Plan hip flexbility, BKFO, lower extremity/core strengthening, gait training.    PT Home Exercise Plan Access Code: [email or web address]    [email or web address] and Agree with Plan of Care Patient    Family Member Consulted husband             Patient will benefit from skilled therapeutic intervention in order to improve the following deficits and impairments:  Abnormal gait, Difficulty walking, Cardiopulmonary status limiting activity, Decreased endurance, Obesity, Decreased activity tolerance, Decreased knowledge of precautions, Decreased skin integrity, Decreased balance, Pain, Decreased mobility, Decreased strength  Visit Diagnosis: Difficulty in walking, not elsewhere classified  Muscle weakness (generalized)  Abnormal posture  Chronic pain of right knee  Peripheral musculoskeletal gait disorder  Chronic pain of left knee     Problem List Patient Active Problem List   Diagnosis Date Noted   Gait abnormality 07/31/2020   Paresthesia 07/31/2020   Low back pain with bilateral sciatica 07/31/2020   Pneumonia due to COVID-19 virus    Bacteremia due to Gram-positive bacteria 08/29/2018   Abscess    Cellulitis of left breast 08/28/2018   Cellulitis of left abdominal wall 08/28/2018   Encounter for routine gynecological examination 04/28/2018   CKD (chronic kidney disease), stage III ([place]) 09/27/2017   Recurrent genital herpes 04/13/2017   Chronic pain disorder 01/25/2017   Malignant neoplasm of upper-outer quadrant of left breast in  female, estrogen receptor positive ([place]) 12/16/2016   Chronic knee pain 12/07/2016   Chronic bilateral low back pain without sciatica 12/07/2016   Peripheral musculoskeletal gait disorder 12/07/2016   Encounter for therapeutic drug monitoring 06/16/2016    Generalized osteoarthritis of multiple sites 05/04/2016   Chronic anticoagulation 05/04/2016   Stroke ([place]) 04/14/2016   Aortic valve disease 04/10/2016   S/P AVR 04/10/2016   NICM (nonischemic cardiomyopathy) ([place]) 04/10/2016   Upper airway cough syndrome 03/14/2016   Morbid obesity ([place]) 03/14/2016   COPD (chronic obstructive pulmonary disease) ([place]) 03/11/2016   Thrombocytopenia ([place]) 12/12/2015   Anemia 12/12/2015   Vitamin D deficiency 12/12/2015   Acute urinary retention 12/11/2015   Osteoporosis 12/11/2015   Hip fracture ([place]) 12/10/2015   Aortic atherosclerosis ([place]) 12/10/2015   Lung blebs ([place]) 12/03/2015   Pelvic fracture ([place]) 08/19/2015   Fall at home 08/19/2015   Essential hypertension 08/19/2015   Mixed hyperlipidemia 08/19/2015   Asthma 08/19/2015   Paroxysmal atrial fibrillation ([place]) 08/19/2015   Hyperthyroidism 08/19/2015   Chronic HFrEF (heart failure with reduced ejection fraction) (HCC)    Coronary artery disease    Aortic stenosis     Gar Ponto MS, PT 09/30/20 9:48 PM   [facility] [address] [place], [place], [address] Phone: [phone]   Fax:  [phone]  Name: Delenn Ahn MRN: [id number] Date of Birth: [birth year]

## 2020-10-01 ENCOUNTER — Encounter (HOSPITAL_COMMUNITY): Payer: Self-pay

## 2020-10-01 ENCOUNTER — Ambulatory Visit (HOSPITAL_COMMUNITY)
Admission: RE | Admit: 2020-10-01 | Discharge: 2020-10-01 | Disposition: A | Discharge status: Home / Self Care | Payer: Medicare Other | Source: Ambulatory Visit | Attending: Family Medicine | Admitting: Family Medicine

## 2020-10-01 VITALS — BP 100/62 | HR 78 | Wt 257.0 lb

## 2020-10-01 DIAGNOSIS — Z09 Encounter for follow-up examination after completed treatment for conditions other than malignant neoplasm: Secondary | ICD-10-CM | POA: Insufficient documentation

## 2020-10-01 DIAGNOSIS — I110 Hypertensive heart disease with heart failure: Secondary | ICD-10-CM | POA: Insufficient documentation

## 2020-10-01 DIAGNOSIS — I351 Nonrheumatic aortic (valve) insufficiency: Secondary | ICD-10-CM | POA: Insufficient documentation

## 2020-10-01 DIAGNOSIS — I5022 Chronic systolic (congestive) heart failure: Secondary | ICD-10-CM | POA: Insufficient documentation

## 2020-10-01 DIAGNOSIS — B373 Candidiasis of vulva and vagina: Secondary | ICD-10-CM | POA: Insufficient documentation

## 2020-10-01 DIAGNOSIS — R42 Dizziness and giddiness: Secondary | ICD-10-CM | POA: Diagnosis not present

## 2020-10-01 DIAGNOSIS — I428 Other cardiomyopathies: Secondary | ICD-10-CM | POA: Insufficient documentation

## 2020-10-01 DIAGNOSIS — Z7984 Long term (current) use of oral hypoglycemic drugs: Secondary | ICD-10-CM | POA: Diagnosis not present

## 2020-10-01 DIAGNOSIS — E0590 Thyrotoxicosis, unspecified without thyrotoxic crisis or storm: Secondary | ICD-10-CM | POA: Insufficient documentation

## 2020-10-01 DIAGNOSIS — R002 Palpitations: Secondary | ICD-10-CM | POA: Diagnosis not present

## 2020-10-01 DIAGNOSIS — R0602 Shortness of breath: Secondary | ICD-10-CM | POA: Insufficient documentation

## 2020-10-01 DIAGNOSIS — I2510 Atherosclerotic heart disease of native coronary artery without angina pectoris: Secondary | ICD-10-CM | POA: Diagnosis not present

## 2020-10-01 DIAGNOSIS — Z79899 Other long term (current) drug therapy: Secondary | ICD-10-CM | POA: Insufficient documentation

## 2020-10-01 DIAGNOSIS — I480 Paroxysmal atrial fibrillation: Secondary | ICD-10-CM | POA: Insufficient documentation

## 2020-10-01 DIAGNOSIS — Z7901 Long term (current) use of anticoagulants: Secondary | ICD-10-CM | POA: Insufficient documentation

## 2020-10-01 DIAGNOSIS — B3731 Acute candidiasis of vulva and vagina: Secondary | ICD-10-CM

## 2020-10-01 DIAGNOSIS — Z8249 Family history of ischemic heart disease and other diseases of the circulatory system: Secondary | ICD-10-CM | POA: Insufficient documentation

## 2020-10-01 DIAGNOSIS — Z87891 Personal history of nicotine dependence: Secondary | ICD-10-CM | POA: Insufficient documentation

## 2020-10-01 DIAGNOSIS — Z953 Presence of xenogenic heart valve: Secondary | ICD-10-CM | POA: Insufficient documentation

## 2020-10-01 DIAGNOSIS — Z952 Presence of prosthetic heart valve: Secondary | ICD-10-CM

## 2020-10-01 DIAGNOSIS — I10 Essential (primary) hypertension: Secondary | ICD-10-CM

## 2020-10-01 LAB — BASIC METABOLIC PANEL
Anion gap: 5 (ref 5–15)
BUN: 13 mg/dL (ref 8–23)
CO2: 25 mmol/L (ref 22–32)
Calcium: 9.3 mg/dL (ref 8.9–10.3)
Chloride: 109 mmol/L (ref 98–111)
Creatinine, Ser: 1.39 mg/dL — ABNORMAL HIGH (ref 0.44–1.00)
GFR, Estimated: 40 mL/min — ABNORMAL LOW (ref >60)
Glucose, Bld: 117 mg/dL — ABNORMAL HIGH (ref 70–99)
Potassium: 4.3 mmol/L (ref 3.5–5.1)
Sodium: 139 mmol/L (ref 135–145)

## 2020-10-01 LAB — BRAIN NATRIURETIC PEPTIDE: B Natriuretic Peptide: 84.5 pg/mL (ref 0.0–100.0)

## 2020-10-01 MED ORDER — FLUCONAZOLE 150 MG PO TABS: 150.0000 mg | ORAL_TABLET | Freq: Once | ORAL | 0 refills | Status: AC

## 2020-10-01 MED ORDER — TORSEMIDE 20 MG PO TABS: 20.0000 mg | ORAL_TABLET | Freq: Every day | ORAL | 3 refills | Status: DC

## 2020-10-01 NOTE — Progress Notes (Signed)
ReDS Vest / Clip - 10/01/20 1100       ReDS Vest / Clip   Station Marker D    Ruler Value 27    ReDS Value Range High volume overload    ReDS Actual Value 43

## 2020-10-01 NOTE — Patient Instructions (Signed)
CHANGE Torsemide to 20 mg, one tab daily  Labs today We will only contact you if something comes back abnormal or we need to make some changes. Otherwise no news is good news!  A chest x-ray takes a picture of the organs and structures inside the chest, including the heart, lungs, and blood vessels. This test can show several things, including, whether the heart is enlarges; whether fluid is building up in the lungs; and whether pacemaker / defibrillator leads are still in place.  Your physician recommends that you schedule a follow-up appointment in: 2 weeks  in the Advanced Practitioners (PA/NP) Clinic   Do the following things EVERYDAY: Weigh yourself in the morning before breakfast. Write it down and keep it in a log. Take your medicines as prescribed Eat low salt foods--Limit salt (sodium) to 2000 mg per day.  Stay as active as you can everyday Limit all fluids for the day to less than 2 liters  milAt the [facility] Clinic, you and your health needs are our priority. As part of our continuing mission to provide you with exceptional heart care, we have created designated Provider Care Teams. These Care Teams include your primary Cardiologist (physician) and Advanced Practice Providers (APPs- Physician Assistants and Nurse Practitioners) who all work together to provide you with the care you need, when you need it.   You may see any of the following providers on your designated Care Team at your next follow up: Dr Glori Bickers Dr [name] Dr Patrice Paradise, NP Lyda Jester, [place] Ginnie Smart Audry Riles, PharmD   Please be sure to bring in all your medications bottles to every appointment.   If you have any questions or concerns before your next appointment please send [place] a message through [place] or call our office at [phone].    TO LEAVE A MESSAGE FOR THE NURSE SELECT OPTION 2, PLEASE LEAVE A MESSAGE INCLUDING: YOUR NAME DATE OF  BIRTH CALL BACK NUMBER REASON FOR CALL**this is important as we prioritize the call backs  YOU WILL RECEIVE A CALL BACK THE SAME DAY AS LONG AS YOU CALL BEFORE 4:00 PM

## 2020-10-02 ENCOUNTER — Ambulatory Visit: Payer: Medicare Other

## 2020-10-02 ENCOUNTER — Other Ambulatory Visit: Payer: Self-pay

## 2020-10-02 DIAGNOSIS — G8929 Other chronic pain: Secondary | ICD-10-CM | POA: Diagnosis not present

## 2020-10-02 DIAGNOSIS — M6281 Muscle weakness (generalized): Secondary | ICD-10-CM | POA: Diagnosis not present

## 2020-10-02 DIAGNOSIS — R293 Abnormal posture: Secondary | ICD-10-CM

## 2020-10-02 DIAGNOSIS — R2689 Other abnormalities of gait and mobility: Secondary | ICD-10-CM | POA: Diagnosis not present

## 2020-10-02 DIAGNOSIS — M25562 Pain in left knee: Secondary | ICD-10-CM | POA: Diagnosis not present

## 2020-10-02 DIAGNOSIS — M25561 Pain in right knee: Secondary | ICD-10-CM | POA: Diagnosis not present

## 2020-10-02 DIAGNOSIS — R262 Difficulty in walking, not elsewhere classified: Secondary | ICD-10-CM

## 2020-10-02 NOTE — Therapy (Signed)
[name], [place], [address] Phone: [phone]   Fax:  [phone]  Physical Therapy Treatment  Patient Details  Name: Danielle [name] MRN: [id number] Date of Birth: [birth year] Referring Provider (PT): Gar Ponto, MD   Encounter Date: 10/02/2020   PT End of Session - 10/02/20 0756     Visit Number 11    Number of Visits 24    Date for PT Re-Evaluation 11/11/20    Authorization Type [employer] - [place] Time Period 10th visit FOTO/reval    PT Start Time 0756    PT Stop Time 0847    PT Time Calculation (min) 51 min    Equipment Utilized During Treatment Gait belt    Activity Tolerance Patient limited by fatigue    Behavior During Therapy [facility] for tasks assessed/performed             Past Medical History:  Diagnosis Date   Allergy    Anxiety    Aortic stenosis    Status post bioprosthetic AVR   Arthritis    DJD   Asthma    Breast cancer ([place]) 12/13/2016   Left breast   Chronic systolic CHF (congestive heart failure) ([place])    EF 30% 04/2014   COPD (chronic obstructive pulmonary disease) ([place])    Coronary artery disease    per pt, had LHC prior to AVR in [place] 2016 that did not show any blockages; no stents/bypass   Gastritis    Hyperlipidemia    Hypertension    Hyperthyroidism    Multiple thyroid nodules    Osteoporosis    Paroxysmal atrial fibrillation ([place])    Pelvis fracture ([place]) 08/19/2015   MULTIPLE    Personal history of radiation therapy 2018   Spontaneous pneumothorax 11/28/2015   left    Stroke ([place]) 2000   rt hand weak    Past Surgical History:  Procedure Laterality Date   BREAST LUMPECTOMY Left 12/2016   BREAST LUMPECTOMY WITH RADIOACTIVE SEED AND SENTINEL LYMPH NODE BIOPSY Left 01/14/2017   Procedure: LEFT BREAST LUMPECTOMY WITH RADIOACTIVE SEED AND SENTINEL LYMPH NODE BIOPSY;  Surgeon: Rolm Bookbinder, MD;  Location: [place];  Service:  General;  Laterality: Left;   CARDIAC CATHETERIZATION     CHEST TUBE INSERTION  10/2015   INTRAMEDULLARY (IM) NAIL INTERTROCHANTERIC Left 12/10/2015   Procedure: INTRAMEDULLARY (IM) NAIL INTERTROCHANTRIC;  Surgeon: Leandrew Koyanagi, MD;  Location: [place];  Service: Orthopedics;  Laterality: Left;   PLEURADESIS Left 12/03/2015   Procedure: PLEURADESIS;  Surgeon: Melrose Nakayama, MD;  Location: [place];  Service: Thoracic;  Laterality: Left;   RESECTION OF APICAL BLEB Left 12/03/2015   Procedure: BLEBECTOMY;  Surgeon: Melrose Nakayama, MD;  Location: [place];  Service: Thoracic;  Laterality: Left;   RIGHT/LEFT HEART CATH AND CORONARY ANGIOGRAPHY N/A 03/10/2020   Procedure: RIGHT/LEFT HEART CATH AND CORONARY ANGIOGRAPHY;  Surgeon: [name], MD;  Location: [place] [facility];  Service: Cardiovascular;  Laterality: N/A;   THORACOSCOPY  12/03/2015   VALVE REPLACEMENT     VIDEO ASSISTED THORACOSCOPY Left 12/03/2015   Procedure: VIDEO ASSISTED THORACOSCOPY;  Surgeon: Melrose Nakayama, MD;  Location: [place];  Service: Thoracic;  Laterality: Left;    There were no vitals filed for this visit.   Subjective Assessment - 10/02/20 0802     Subjective The patient reports that she saw the heart MD yesterday and the found fluid on your lungs and  they changed her medication some.  They make her SOB.    Pertinent History OP, COPD, pneumothorax, arthritis, Obesity, Cornary Artery disease, breast cancer                [facility] PT Assessment - 10/02/20 0001       Assessment   Medical Diagnosis R26.9 (ICD-10-CM) - Gait abnormality  R20.2 (ICD-10-CM) - Paresthesia  M54.42,M54.41 (ICD-10-CM) - Low back pain with bilateral sciatica, unspecified back pain laterality, unspecified chronicity    Referring Provider (PT) Kristeins, Luanna Salk, MD                           [facility] Adult PT Treatment/Exercise - 10/02/20 0001       Ambulation/Gait   Ambulation/Gait Yes    Ambulation/Gait  Assistance 5: Supervision    Ambulation Distance (Feet) 120 Feet    Assistive device 4-wheeled walker    Gait Pattern Step-through pattern;Decreased step length - right;Decreased step length - left      Lumbar Exercises: Aerobic   Nustep L3 x 5 min  LE only      Knee/Hip Exercises: Standing   Heel Raises 2 sets;Both;10 reps    Heel Raises Limitations in // bars    Hip Flexion 3 sets;10 reps    Hip Flexion Limitations in // bars alternating    Hip Abduction Right;Left;10 reps;2 sets    Abduction Limitations cuing to stand up right in // bars    Functional Squat 10 reps;2 sets    Functional Squat Limitations mini squat in // bars    Other Standing Knee Exercises Bilat stand on airex 20" x 3 CGA    Other Standing Knee Exercises Side stepping in // bars x 2 laps      Knee/Hip Exercises: Seated   Long Arc Quad Right;Left;2 sets;20 reps    Long Arc Quad Weight 2 lbs.                      PT Short Term Goals - 09/16/20 0948       PT SHORT TERM GOAL #1   Title The patient will be independent in an initial HEP of sitting exercises    Baseline met    Time 3    Period Weeks    Status Achieved    Target Date 09/09/20               PT Long Term Goals - 09/16/20 0948       PT LONG TERM GOAL #1   Title The patient will be able to transfer sit to stand x 5 reps without use of hands in < or equal to 18 seconds.    Baseline 5 x STS 43.34 sec  ( risk of fall 15 sec or more) Pt must use hands    Time 12    Period Weeks    Status On-going      PT LONG TERM GOAL #2   Title The patient will be able to ambulate x 200 without SOB and use of FWW for community ambulation.    Baseline Pt with SOB walking 120 feet today    Time 12    Period Weeks    Status On-going      PT LONG TERM GOAL #3   Title The patient will improve FOTO outcome score to 46% for improvement of functional home activities.    Baseline 30% eval  Time 12    Period Weeks    Status On-going       PT LONG TERM GOAL #4   Title The patient will be able to stand for 20 minutes with low back pain under or equal to 3/10 and use of hands for support    Baseline unable to stand without use of hands and pain 7/10    Time 12    Period Weeks    Status On-going      PT LONG TERM GOAL #5   Title Pt will gross LE MMT to 4+/5 for improved strength.    Baseline see flow chart MMT    Time 8    Period Weeks    Status On-going                   Plan - 10/02/20 0756     Clinical Impression Statement The patient was able to tolerate mostly standing exercises in the parallel bars today in therapy.  She did have SOB and required short breaks.  The patient was able to perform the airex standing and side stepping in the parrallel bars without reports of pain increase.  The patient did present with improvement of gait pattern with arriving to therapy as the had less of a lateral trunk tilt with right stance time.  She was also able to walk for increase of distance without a rest break of 120 feet.  Recommend continued therapy for improvement of transfers and walkings/standing tolerance.    Personal Factors and Comorbidities Comorbidity 3+;Fitness    Comorbidities OP, COPD, Coronary artery disease, obesity, arthritis    Examination-Activity Limitations Bathing;Locomotion Level;Transfers;Bed Mobility;Caring for Others;Carry;Squat;Dressing;Stairs;Stand;Toileting;Lift;Sleep    Examination-Participation Restrictions Church;Cleaning;Meal Prep;Community Activity;Laundry;Shop;Volunteer;[place] Leaver Work    PT Treatment/Interventions Aquatic Therapy;ADLs/Self Care Home Management;Moist Heat;Cryotherapy;Gait training;Stair training;Functional mobility training;Therapeutic activities;Therapeutic exercise;Balance training;Neuromuscular re-education;Manual techniques;Orthotic Fit/Training;Patient/family education;Dry needling;Passive range of motion;Energy conservation    PT Next Visit Plan hip flexbility, BKFO, lower  extremity/core strengthening, gait training, endurance    PT Home Exercise Plan Access Code: [email or web address]    Consulted and Agree with Plan of Care Patient             Patient will benefit from skilled therapeutic intervention in order to improve the following deficits and impairments:  Abnormal gait, Difficulty walking, Cardiopulmonary status limiting activity, Decreased endurance, Obesity, Decreased activity tolerance, Decreased knowledge of precautions, Decreased skin integrity, Decreased balance, Pain, Decreased mobility, Decreased strength  Visit Diagnosis: Difficulty in walking, not elsewhere classified  Muscle weakness (generalized)  Abnormal posture     Problem List Patient Active Problem List   Diagnosis Date Noted   Gait abnormality 07/31/2020   Paresthesia 07/31/2020   Low back pain with bilateral sciatica 07/31/2020   Pneumonia due to COVID-19 virus    Bacteremia due to Gram-positive bacteria 08/29/2018   Abscess    Cellulitis of left breast 08/28/2018   Cellulitis of left abdominal wall 08/28/2018   Encounter for routine gynecological examination 04/28/2018   CKD (chronic kidney disease), stage III ([place]) 09/27/2017   Recurrent genital herpes 04/13/2017   Chronic pain disorder 01/25/2017   Malignant neoplasm of upper-outer quadrant of left breast in female, estrogen receptor positive ([place]) 12/16/2016   Chronic knee pain 12/07/2016   Chronic bilateral low back pain without sciatica 12/07/2016   Peripheral musculoskeletal gait disorder 12/07/2016   Encounter for therapeutic drug monitoring 06/16/2016   Generalized osteoarthritis of multiple sites 05/04/2016   Chronic anticoagulation 05/04/2016   Stroke ([place]) 04/14/2016  Aortic valve disease 04/10/2016   S/P AVR 04/10/2016   NICM (nonischemic cardiomyopathy) ([place]) 04/10/2016   Upper airway cough syndrome 03/14/2016   Morbid obesity ([place]) 03/14/2016   COPD (chronic obstructive pulmonary disease) ([place]) 03/11/2016    Thrombocytopenia ([place]) 12/12/2015   Anemia 12/12/2015   Vitamin D deficiency 12/12/2015   Acute urinary retention 12/11/2015   Osteoporosis 12/11/2015   Hip fracture ([place]) 12/10/2015   Aortic atherosclerosis ([place]) 12/10/2015   Lung blebs ([place]) 12/03/2015   Pelvic fracture ([place]) 08/19/2015   Fall at home 08/19/2015   Essential hypertension 08/19/2015   Mixed hyperlipidemia 08/19/2015   Asthma 08/19/2015   Paroxysmal atrial fibrillation ([place]) 08/19/2015   Hyperthyroidism 08/19/2015   Chronic HFrEF (heart failure with reduced ejection fraction) (HCC)    Coronary artery disease    Aortic stenosis    Rich Number, PT, DPT, OCS, Crt. DN  Bethena Midget 10/02/2020, 8:48 AM  [facility] [address] [place], [place], [address] Phone: [phone]   Fax:  [phone]  Name: Treniece Holsclaw MRN: [id number] Date of Birth: [birth year]

## 2020-10-06 ENCOUNTER — Other Ambulatory Visit (HOSPITAL_COMMUNITY): Payer: Self-pay | Admitting: *Deleted

## 2020-10-06 ENCOUNTER — Other Ambulatory Visit: Payer: Self-pay

## 2020-10-06 ENCOUNTER — Telehealth: Payer: Self-pay | Admitting: Pharmacist

## 2020-10-06 ENCOUNTER — Ambulatory Visit (INDEPENDENT_AMBULATORY_CARE_PROVIDER_SITE_OTHER): Payer: Medicare Other | Admitting: General Practice

## 2020-10-06 DIAGNOSIS — Z7901 Long term (current) use of anticoagulants: Secondary | ICD-10-CM | POA: Diagnosis not present

## 2020-10-06 LAB — POCT INR: INR: 2.7 (ref 2.0–3.0)

## 2020-10-06 MED ORDER — PANTOPRAZOLE SODIUM 40 MG PO TBEC: 40.0000 mg | DELAYED_RELEASE_TABLET | Freq: Every day | ORAL | 11 refills | Status: DC

## 2020-10-06 NOTE — Chronic Care Management (AMB) (Signed)
    Chronic Care Management Pharmacy Assistant   Name: Severa Schmal  MRN: [id number] DOB: [birth year]  Completed patient assistance application for patient for [place] for Symbicort per [name] the clinical pharmacist instructions. Will print application and mail to patient with a return envelope. Patient aware per [name].  Medications: Outpatient Encounter Medications as of 10/06/2020  Medication Sig Note   albuterol (PROVENTIL HFA;VENTOLIN HFA) 108 (90 Base) MCG/ACT inhaler Inhale 2 puffs into the lungs every 6 (six) hours as needed for wheezing or shortness of breath.     alendronate (FOSAMAX) 70 MG tablet Take 1 tablet (70 mg total) by mouth once a week. Take with a full glass of water on an empty stomach.    ascorbic acid (VITAMIN C) 500 MG tablet Take 500 mg by mouth daily.    budesonide-formoterol (SYMBICORT) 160-4.5 MCG/ACT inhaler Inhale 2 puffs into the lungs daily as needed.    carvedilol (COREG) 25 MG tablet Take 25 mg by mouth 2 (two) times daily with a meal.    Cholecalciferol 25 MCG (1000 UT) tablet Take 1,000 Units by mouth daily.    dapagliflozin propanediol (FARXIGA) 10 MG TABS tablet Take 1 tablet (10 mg total) by mouth daily before breakfast.    diclofenac sodium (VOLTAREN) 1 % GEL Apply 4 g topically 4 (four) times daily.    diphenhydramine-acetaminophen (TYLENOL PM) 25-500 MG TABS tablet Take 2 tablets by mouth at bedtime.    fluticasone (FLONASE) 50 MCG/ACT nasal spray Place 1 spray into both nostrils daily.    methimazole (TAPAZOLE) 5 MG tablet Take 3 tablets (15 mg total) by mouth daily.    pantoprazole (PROTONIX) 40 MG tablet Take 1 tablet (40 mg total) by mouth daily.    rosuvastatin (CRESTOR) 40 MG tablet TAKE 1 TABLET BY MOUTH  DAILY    sacubitril-valsartan (ENTRESTO) 49-51 MG Take 1 tablet by mouth 2 (two) times daily.    senna-docusate (SENOKOT-S) 8.6-50 MG tablet Take 2 tablets at bedtime by mouth.     tamoxifen (NOLVADEX) 20  MG tablet Take 1 tablet (20 mg total) by mouth daily.    torsemide (DEMADEX) 20 MG tablet Take 1 tablet (20 mg total) by mouth daily.    traMADol (ULTRAM) 50 MG tablet Take 1 tablet (50 mg total) by mouth 3 (three) times daily. 09/16/2020: Fill date 08/27/20 #90 today #55 last dose today   trolamine salicylate (ASPERCREME) 10 % cream Apply 1 application topically as needed for muscle pain.    valACYclovir (VALTREX) 500 MG tablet Take 500 mg by mouth 2 (two) times daily as needed.    warfarin (COUMADIN) 3 MG tablet Take 1 tablet daily or Take as directed by anticoagulation clinic    No facility-administered encounter medications on file as of 10/06/2020.    Care Gaps:  AWV - scheduled for 12/10/20 Colonoscopy - never done Influenza vaccine -  due since 09/29/20  Star Rating Drugs:  Dapagliflozin '10mg'$  - last filled on 09/05/20 30DS at [facility] Rosuvastatin '40mg'$  - last filled on 06/23/20 90DS at [facility] - valsartan 49-'51mg'$  - last filled on 12/14/19 90DS at [facility] [phone]

## 2020-10-06 NOTE — Patient Instructions (Addendum)
Pre visit review using our clinic review tool, if applicable. No additional management support is needed unless otherwise documented below in the visit note.  Continue taking '3mg'$  daily except  take 1.5 mg on Mon, Wed, and Fri. Recheck on 5 weeks.

## 2020-10-07 ENCOUNTER — Ambulatory Visit: Payer: Medicare Other

## 2020-10-07 DIAGNOSIS — R2689 Other abnormalities of gait and mobility: Secondary | ICD-10-CM | POA: Diagnosis not present

## 2020-10-07 DIAGNOSIS — R293 Abnormal posture: Secondary | ICD-10-CM | POA: Diagnosis not present

## 2020-10-07 DIAGNOSIS — R262 Difficulty in walking, not elsewhere classified: Secondary | ICD-10-CM

## 2020-10-07 DIAGNOSIS — M6281 Muscle weakness (generalized): Secondary | ICD-10-CM | POA: Diagnosis not present

## 2020-10-07 DIAGNOSIS — G8929 Other chronic pain: Secondary | ICD-10-CM | POA: Diagnosis not present

## 2020-10-07 DIAGNOSIS — M25562 Pain in left knee: Secondary | ICD-10-CM | POA: Diagnosis not present

## 2020-10-07 DIAGNOSIS — M25561 Pain in right knee: Secondary | ICD-10-CM | POA: Diagnosis not present

## 2020-10-07 NOTE — Therapy (Signed)
[place], [place], [address] Phone: [phone]   Fax:  [phone]  Physical Therapy Treatment  Patient Details  Name: Danielle [name] MRN: [id number] Date of Birth: [birth year] Referring Provider (PT): Gar Ponto, MD   Encounter Date: 10/07/2020   PT End of Session - 10/07/20 0755     Visit Number 12    Number of Visits 24    Date for PT Re-Evaluation 11/11/20    Authorization Type UHC - [place] Time Period 10th visit FOTO/reval    Progress Note Due on Visit 10    PT Start Time 0756    PT Stop Time 0841    PT Time Calculation (min) 45 min    Activity Tolerance Patient limited by fatigue    Behavior During Therapy [facility] for tasks assessed/performed             Past Medical History:  Diagnosis Date   Allergy    Anxiety    Aortic stenosis    Status post bioprosthetic AVR   Arthritis    DJD   Asthma    Breast cancer ([place]) 12/13/2016   Left breast   Chronic systolic CHF (congestive heart failure) ([place])    EF 30% 04/2014   COPD (chronic obstructive pulmonary disease) ([place])    Coronary artery disease    per pt, had LHC prior to AVR in [place] 2016 that did not show any blockages; no stents/bypass   Gastritis    Hyperlipidemia    Hypertension    Hyperthyroidism    Multiple thyroid nodules    Osteoporosis    Paroxysmal atrial fibrillation ([place])    Pelvis fracture ([place]) 08/19/2015   MULTIPLE    Personal history of radiation therapy 2018   Spontaneous pneumothorax 11/28/2015   left    Stroke ([place]) 2000   rt hand weak    Past Surgical History:  Procedure Laterality Date   BREAST LUMPECTOMY Left 12/2016   BREAST LUMPECTOMY WITH RADIOACTIVE SEED AND SENTINEL LYMPH NODE BIOPSY Left 01/14/2017   Procedure: LEFT BREAST LUMPECTOMY WITH RADIOACTIVE SEED AND SENTINEL LYMPH NODE BIOPSY;  Surgeon: Rolm Bookbinder, MD;  Location: [place];  Service: General;   Laterality: Left;   CARDIAC CATHETERIZATION     CHEST TUBE INSERTION  10/2015   INTRAMEDULLARY (IM) NAIL INTERTROCHANTERIC Left 12/10/2015   Procedure: INTRAMEDULLARY (IM) NAIL INTERTROCHANTRIC;  Surgeon: Leandrew Koyanagi, MD;  Location: [place];  Service: Orthopedics;  Laterality: Left;   PLEURADESIS Left 12/03/2015   Procedure: PLEURADESIS;  Surgeon: Melrose Nakayama, MD;  Location: [place];  Service: Thoracic;  Laterality: Left;   RESECTION OF APICAL BLEB Left 12/03/2015   Procedure: BLEBECTOMY;  Surgeon: Melrose Nakayama, MD;  Location: [place];  Service: Thoracic;  Laterality: Left;   RIGHT/LEFT HEART CATH AND CORONARY ANGIOGRAPHY N/A 03/10/2020   Procedure: RIGHT/LEFT HEART CATH AND CORONARY ANGIOGRAPHY;  Surgeon: [name], MD;  Location: [place] CV [facility];  Service: Cardiovascular;  Laterality: N/A;   THORACOSCOPY  12/03/2015   VALVE REPLACEMENT     VIDEO ASSISTED THORACOSCOPY Left 12/03/2015   Procedure: VIDEO ASSISTED THORACOSCOPY;  Surgeon: Melrose Nakayama, MD;  Location: [place];  Service: Thoracic;  Laterality: Left;    There were no vitals filed for this visit.   Subjective Assessment - 10/07/20 0802     Subjective Danielle [name] reports that her SOB has gotten worse due to the medication that her MD has her on.  Pertinent History OP, COPD, pneumothorax, arthritis, Obesity, Cornary Artery disease, breast cancer    How long can you walk comfortably? 200 feet with 4 wheeled walker                [facility] PT Assessment - 10/07/20 0001       Assessment   Medical Diagnosis R26.9 (ICD-10-CM) - Gait abnormality  R20.2 (ICD-10-CM) - Paresthesia  M54.42,M54.41 (ICD-10-CM) - Low back pain with bilateral sciatica, unspecified back pain laterality, unspecified chronicity    Referring Provider (PT) Kristeins, Luanna Salk, MD                           [facility] Adult PT Treatment/Exercise - 10/07/20 0001       Lumbar Exercises: Stretches   Passive Hamstring  Stretch 30 seconds;2 reps;Left;Right    Passive Hamstring Stretch Limitations Sitting      Lumbar Exercises: Aerobic   Nustep L4 x 5 min  LE only      Knee/Hip Exercises: Standing   Heel Raises 2 sets;Both;10 reps    Heel Raises Limitations in // bars    Hip Flexion 20 reps;2 sets    Hip Flexion Limitations in // bars alternating    Hip Abduction Right;Left;10 reps;2 sets    Abduction Limitations cuing to stand up right in // bars    Functional Squat 10 reps;2 sets    Functional Squat Limitations mini squat in // bars    Other Standing Knee Exercises AirEx step up x 5 x 2 each      Knee/Hip Exercises: Seated   Long Arc Quad Right;Left;2 sets;20 reps    Long Arc Quad Weight 3 lbs.                      PT Short Term Goals - 09/16/20 0948       PT SHORT TERM GOAL #1   Title The patient will be independent in an initial HEP of sitting exercises    Baseline met    Time 3    Period Weeks    Status Achieved    Target Date 09/09/20               PT Long Term Goals - 09/16/20 0948       PT LONG TERM GOAL #1   Title The patient will be able to transfer sit to stand x 5 reps without use of hands in < or equal to 18 seconds.    Baseline 5 x STS 43.34 sec  ( risk of fall 15 sec or more) Pt must use hands    Time 12    Period Weeks    Status On-going      PT LONG TERM GOAL #2   Title The patient will be able to ambulate x 200 without SOB and use of FWW for community ambulation.    Baseline Pt with SOB walking 120 feet today    Time 12    Period Weeks    Status On-going      PT LONG TERM GOAL #3   Title The patient will improve FOTO outcome score to 46% for improvement of functional home activities.    Baseline 30% eval    Time 12    Period Weeks    Status On-going      PT LONG TERM GOAL #4   Title The patient will be able to stand for 20 minutes with  low back pain under or equal to 3/10 and use of hands for support    Baseline unable to stand without  use of hands and pain 7/10    Time 12    Period Weeks    Status On-going      PT LONG TERM GOAL #5   Title Pt will gross LE MMT to 4+/5 for improved strength.    Baseline see flow chart MMT    Time 8    Period Weeks    Status On-going                   Plan - 10/07/20 0756     Clinical Impression Statement The patient was limited today due to fatigue and SOB.  She was limited walking to 80 feet prior to needing to seat down due to SOB.  She reports that it is due the medication increase.  The patient did have some reports of right knee pain with standing hip abduction.  She was able to do the all the other standing exercises without reports of right knee pain.  The patient has two more PT appointments scheduled.  Possible DC to [facility] at that time.    Personal Factors and Comorbidities Comorbidity 3+;Fitness    Comorbidities OP, COPD, Coronary artery disease, obesity, arthritis    Examination-Activity Limitations Bathing;Locomotion Level;Transfers;Bed Mobility;Caring for Others;Carry;Squat;Dressing;Stairs;Stand;Toileting;Lift;Sleep    Examination-Participation Restrictions Church;Cleaning;Meal Prep;Community Activity;Laundry;Shop;Volunteer;[employer] Work    PT Treatment/Interventions Aquatic Therapy;ADLs/Self Care Home Management;Moist Heat;Cryotherapy;Gait training;Stair training;Functional mobility training;Therapeutic activities;Therapeutic exercise;Balance training;Neuromuscular re-education;Manual techniques;Orthotic Fit/Training;Patient/family education;Dry needling;Passive range of motion;Energy conservation    PT Next Visit Plan hip flexbility, BKFO, lower extremity/core strengthening, gait training, endurance    PT Home Exercise Plan Access Code: [email or web address]    Consulted and Agree with Plan of Care Patient             Patient will benefit from skilled therapeutic intervention in order to improve the following deficits and impairments:  Abnormal gait, Difficulty walking,  Cardiopulmonary status limiting activity, Decreased endurance, Obesity, Decreased activity tolerance, Decreased knowledge of precautions, Decreased skin integrity, Decreased balance, Pain, Decreased mobility, Decreased strength  Visit Diagnosis: Difficulty in walking, not elsewhere classified  Muscle weakness (generalized)  Abnormal posture     Problem List Patient Active Problem List   Diagnosis Date Noted   Gait abnormality 07/31/2020   Paresthesia 07/31/2020   Low back pain with bilateral sciatica 07/31/2020   Pneumonia due to COVID-19 virus    Bacteremia due to Gram-positive bacteria 08/29/2018   Abscess    Cellulitis of left breast 08/28/2018   Cellulitis of left abdominal wall 08/28/2018   Encounter for routine gynecological examination 04/28/2018   CKD (chronic kidney disease), stage III ([place]) 09/27/2017   Recurrent genital herpes 04/13/2017   Chronic pain disorder 01/25/2017   Malignant neoplasm of upper-outer quadrant of left breast in female, estrogen receptor positive ([place]) 12/16/2016   Chronic knee pain 12/07/2016   Chronic bilateral low back pain without sciatica 12/07/2016   Peripheral musculoskeletal gait disorder 12/07/2016   Encounter for therapeutic drug monitoring 06/16/2016   Generalized osteoarthritis of multiple sites 05/04/2016   Chronic anticoagulation 05/04/2016   Stroke ([place]) 04/14/2016   Aortic valve disease 04/10/2016   S/P AVR 04/10/2016   NICM (nonischemic cardiomyopathy) ([place]) 04/10/2016   Upper airway cough syndrome 03/14/2016   Morbid obesity ([place]) 03/14/2016   COPD (chronic obstructive pulmonary disease) ([place]) 03/11/2016   Thrombocytopenia ([place]) 12/12/2015   Anemia 12/12/2015   Vitamin D  deficiency 12/12/2015   Acute urinary retention 12/11/2015   Osteoporosis 12/11/2015   Hip fracture ([place]) 12/10/2015   Aortic atherosclerosis ([place]) 12/10/2015   Lung blebs ([place]) 12/03/2015   Pelvic fracture ([place]) 08/19/2015   Fall at home 08/19/2015    Essential hypertension 08/19/2015   Mixed hyperlipidemia 08/19/2015   Asthma 08/19/2015   Paroxysmal atrial fibrillation ([place]) 08/19/2015   Hyperthyroidism 08/19/2015   Chronic HFrEF (heart failure with reduced ejection fraction) (HCC)    Coronary artery disease    Aortic stenosis    Rich Number, PT, DPT, OCS, Crt. DN Bethena Midget 10/07/2020, 8:44 AM  [facility] [address] [place], [place], [address] Phone: [phone]   Fax:  [phone]  Name: Danielle [name] MRN: [id number] Date of Birth: January 06, 1946

## 2020-10-09 ENCOUNTER — Ambulatory Visit: Payer: Medicare Other

## 2020-10-09 ENCOUNTER — Other Ambulatory Visit: Payer: Self-pay

## 2020-10-09 DIAGNOSIS — R2689 Other abnormalities of gait and mobility: Secondary | ICD-10-CM | POA: Diagnosis not present

## 2020-10-09 DIAGNOSIS — M25561 Pain in right knee: Secondary | ICD-10-CM | POA: Diagnosis not present

## 2020-10-09 DIAGNOSIS — R293 Abnormal posture: Secondary | ICD-10-CM | POA: Diagnosis not present

## 2020-10-09 DIAGNOSIS — M25562 Pain in left knee: Secondary | ICD-10-CM | POA: Diagnosis not present

## 2020-10-09 DIAGNOSIS — M6281 Muscle weakness (generalized): Secondary | ICD-10-CM

## 2020-10-09 DIAGNOSIS — G8929 Other chronic pain: Secondary | ICD-10-CM | POA: Diagnosis not present

## 2020-10-09 DIAGNOSIS — R262 Difficulty in walking, not elsewhere classified: Secondary | ICD-10-CM | POA: Diagnosis not present

## 2020-10-09 NOTE — Therapy (Signed)
[place], [place], [address] Phone: [phone]   Fax:  [phone]  Physical Therapy Treatment  Patient Details  Name: Danielle [name] MRN: [id number] Date of Birth: [birth year] Referring Provider (PT): Gar Ponto, MD   Encounter Date: 10/09/2020   PT End of Session - 10/09/20 0757     Visit Number 13    Number of Visits 24    Date for PT Re-Evaluation 11/11/20    Authorization Type UHC - [place] Time Period 10th visit FOTO/reval    PT Start Time 3557    PT Stop Time 0847    PT Time Calculation (min) 50 min    Equipment Utilized During Treatment Gait belt    Activity Tolerance Patient limited by fatigue    Behavior During Therapy [facility] for tasks assessed/performed             Past Medical History:  Diagnosis Date   Allergy    Anxiety    Aortic stenosis    Status post bioprosthetic AVR   Arthritis    DJD   Asthma    Breast cancer ([place]) 12/13/2016   Left breast   Chronic systolic CHF (congestive heart failure) ([place])    EF 30% 04/2014   COPD (chronic obstructive pulmonary disease) ([place])    Coronary artery disease    per pt, had LHC prior to AVR in [place] 2016 that did not show any blockages; no stents/bypass   Gastritis    Hyperlipidemia    Hypertension    Hyperthyroidism    Multiple thyroid nodules    Osteoporosis    Paroxysmal atrial fibrillation ([place])    Pelvis fracture ([place]) 08/19/2015   MULTIPLE    Personal history of radiation therapy 2018   Spontaneous pneumothorax 11/28/2015   left    Stroke ([place]) 2000   rt hand weak    Past Surgical History:  Procedure Laterality Date   BREAST LUMPECTOMY Left 12/2016   BREAST LUMPECTOMY WITH RADIOACTIVE SEED AND SENTINEL LYMPH NODE BIOPSY Left 01/14/2017   Procedure: LEFT BREAST LUMPECTOMY WITH RADIOACTIVE SEED AND SENTINEL LYMPH NODE BIOPSY;  Surgeon: Rolm Bookbinder, MD;  Location: [place];   Service: General;  Laterality: Left;   CARDIAC CATHETERIZATION     CHEST TUBE INSERTION  10/2015   INTRAMEDULLARY (IM) NAIL INTERTROCHANTERIC Left 12/10/2015   Procedure: INTRAMEDULLARY (IM) NAIL INTERTROCHANTRIC;  Surgeon: Leandrew Koyanagi, MD;  Location: [place];  Service: Orthopedics;  Laterality: Left;   PLEURADESIS Left 12/03/2015   Procedure: PLEURADESIS;  Surgeon: Melrose Nakayama, MD;  Location: [place];  Service: Thoracic;  Laterality: Left;   RESECTION OF APICAL BLEB Left 12/03/2015   Procedure: BLEBECTOMY;  Surgeon: Melrose Nakayama, MD;  Location: [place];  Service: Thoracic;  Laterality: Left;   RIGHT/LEFT HEART CATH AND CORONARY ANGIOGRAPHY N/A 03/10/2020   Procedure: RIGHT/LEFT HEART CATH AND CORONARY ANGIOGRAPHY;  Surgeon: [name], MD;  Location: [place] CV [facility];  Service: Cardiovascular;  Laterality: N/A;   THORACOSCOPY  12/03/2015   VALVE REPLACEMENT     VIDEO ASSISTED THORACOSCOPY Left 12/03/2015   Procedure: VIDEO ASSISTED THORACOSCOPY;  Surgeon: Melrose Nakayama, MD;  Location: [place];  Service: Thoracic;  Laterality: Left;    There were no vitals filed for this visit.   Subjective Assessment - 10/09/20 0805     Subjective The patient reports that it is cloudy today and her joints are achy.    Pertinent History  OP, COPD, pneumothorax, arthritis, Obesity, Cornary Artery disease, breast cancer    Limitations Lifting;Standing;Walking;House hold activities    Currently in Pain? Yes    Pain Score 6     Pain Location Knee    Pain Orientation Right    Pain Descriptors / Indicators Aching                [facility] PT Assessment - 10/09/20 0001       Assessment   Medical Diagnosis R26.9 (ICD-10-CM) - Gait abnormality  R20.2 (ICD-10-CM) - Paresthesia  M54.42,M54.41 (ICD-10-CM) - Low back pain with bilateral sciatica, unspecified back pain laterality, unspecified chronicity    Referring Provider (PT) Kristeins, Luanna Salk, MD      Transfers   Five time sit to  stand comments  29 seconds, use of hands bilaterally.                           [place] Adult PT Treatment/Exercise - 10/09/20 0001       Lumbar Exercises: Aerobic   Stationary Bike 2' for knee flexion motion    Nustep L4 x 5 min  LE only      Knee/Hip Exercises: Standing   Heel Raises 2 sets;Both;10 reps    Heel Raises Limitations in // bars    Hip Flexion 20 reps;2 sets    Hip Flexion Limitations in // bars alternating    Functional Squat --    Functional Squat Limitations --    Other Standing Knee Exercises AirEx step up x 5 x 1 each      Knee/Hip Exercises: Seated   Long Arc Quad Right;Left;2 sets;20 reps    Long Arc Quad Weight 3 lbs.    Sit to [employer] 2 sets;5 reps                      PT Short Term Goals - 09/16/20 0948       PT SHORT TERM GOAL #1   Title The patient will be independent in an initial HEP of sitting exercises    Baseline met    Time 3    Period Weeks    Status Achieved    Target Date 09/09/20               PT Long Term Goals - 09/16/20 0948       PT LONG TERM GOAL #1   Title The patient will be able to transfer sit to stand x 5 reps without use of hands in < or equal to 18 seconds.    Baseline 5 x STS 43.34 sec  ( risk of fall 15 sec or more) Pt must use hands    Time 12    Period Weeks    Status On-going      PT LONG TERM GOAL #2   Title The patient will be able to ambulate x 200 without SOB and use of FWW for community ambulation.    Baseline Pt with SOB walking 120 feet today    Time 12    Period Weeks    Status On-going      PT LONG TERM GOAL #3   Title The patient will improve FOTO outcome score to 46% for improvement of functional home activities.    Baseline 30% eval    Time 12    Period Weeks    Status On-going      PT LONG TERM GOAL #4  Title The patient will be able to stand for 20 minutes with low back pain under or equal to 3/10 and use of hands for support    Baseline unable to stand  without use of hands and pain 7/10    Time 12    Period Weeks    Status On-going      PT LONG TERM GOAL #5   Title Pt will gross LE MMT to 4+/5 for improved strength.    Baseline see flow chart MMT    Time 8    Period Weeks    Status On-going                   Plan - 10/09/20 0841     Clinical Impression Statement Danielle [name] arrived with reports of increase of right knee pain and stiffness due to the weather.  She was limited with walking 50 to 70 feet due to SOB.  The patient did have a slight improvement of 5 sit to stand test time with 29 seconds versus 30 seconds.  Danielle [name] was unable to complete 2 sets of airex step ups due to the right knee pain.  She did need mid assistance with the final sit to stand due to fatigue.  Recommend continued therapy for lower extremity and core strengthening / endurance.    Personal Factors and Comorbidities Comorbidity 3+;Fitness    Comorbidities OP, COPD, Coronary artery disease, obesity, arthritis    Examination-Activity Limitations Bathing;Locomotion Level;Transfers;Bed Mobility;Caring for Others;Carry;Squat;Dressing;Stairs;Stand;Toileting;Lift;Sleep    Examination-Participation Restrictions Church;Cleaning;Meal Prep;Community Activity;Laundry;Shop;Volunteer;Yard Work    PT Next Visit Plan hip flexbility, BKFO, lower extremity/core strengthening, gait training, endurance, standing strengthening exercises    PT Home Exercise Plan Access Code: [email or web address]    [email or web address] and Agree with Plan of Care Patient             Patient will benefit from skilled therapeutic intervention in order to improve the following deficits and impairments:  Abnormal gait, Difficulty walking, Cardiopulmonary status limiting activity, Decreased endurance, Obesity, Decreased activity tolerance, Decreased knowledge of precautions, Decreased skin integrity, Decreased balance, Pain, Decreased mobility, Decreased strength  Visit Diagnosis: Difficulty in walking, not  elsewhere classified  Muscle weakness (generalized)  Abnormal posture     Problem List Patient Active Problem List   Diagnosis Date Noted   Gait abnormality 07/31/2020   Paresthesia 07/31/2020   Low back pain with bilateral sciatica 07/31/2020   Pneumonia due to COVID-19 virus    Bacteremia due to Gram-positive bacteria 08/29/2018   Abscess    Cellulitis of left breast 08/28/2018   Cellulitis of left abdominal wall 08/28/2018   Encounter for routine gynecological examination 04/28/2018   CKD (chronic kidney disease), stage III ([place]) 09/27/2017   Recurrent genital herpes 04/13/2017   Chronic pain disorder 01/25/2017   Malignant neoplasm of upper-outer quadrant of left breast in female, estrogen receptor positive ([place]) 12/16/2016   Chronic knee pain 12/07/2016   Chronic bilateral low back pain without sciatica 12/07/2016   Peripheral musculoskeletal gait disorder 12/07/2016   Encounter for therapeutic drug monitoring 06/16/2016   Generalized osteoarthritis of multiple sites 05/04/2016   Chronic anticoagulation 05/04/2016   Stroke ([place]) 04/14/2016   Aortic valve disease 04/10/2016   S/P AVR 04/10/2016   NICM (nonischemic cardiomyopathy) ([place]) 04/10/2016   Upper airway cough syndrome 03/14/2016   Morbid obesity ([place]) 03/14/2016   COPD (chronic obstructive pulmonary disease) ([place]) 03/11/2016   Thrombocytopenia ([place]) 12/12/2015   Anemia 12/12/2015   Vitamin D deficiency 12/12/2015  Acute urinary retention 12/11/2015   Osteoporosis 12/11/2015   Hip fracture ([place]) 12/10/2015   Aortic atherosclerosis ([place]) 12/10/2015   Lung blebs ([place]) 12/03/2015   Pelvic fracture ([place]) 08/19/2015   Fall at home 08/19/2015   Essential hypertension 08/19/2015   Mixed hyperlipidemia 08/19/2015   Asthma 08/19/2015   Paroxysmal atrial fibrillation ([place]) 08/19/2015   Hyperthyroidism 08/19/2015   Chronic HFrEF (heart failure with reduced ejection fraction) (HCC)    Coronary artery disease     Aortic stenosis    Rich Number, PT, DPT, OCS, Crt. DN  Bethena Midget 10/09/2020, 8:54 AM  [facility] [address] [place], [place], [address] Phone: [phone]   Fax:  [phone]  Name: Danielle [name] MRN: [id number] Date of Birth: Mar 07, 1945

## 2020-10-13 ENCOUNTER — Ambulatory Visit: Payer: Medicare Other

## 2020-10-13 ENCOUNTER — Other Ambulatory Visit: Payer: Self-pay

## 2020-10-13 DIAGNOSIS — M6281 Muscle weakness (generalized): Secondary | ICD-10-CM | POA: Diagnosis not present

## 2020-10-13 DIAGNOSIS — R2689 Other abnormalities of gait and mobility: Secondary | ICD-10-CM | POA: Diagnosis not present

## 2020-10-13 DIAGNOSIS — G8929 Other chronic pain: Secondary | ICD-10-CM | POA: Diagnosis not present

## 2020-10-13 DIAGNOSIS — R293 Abnormal posture: Secondary | ICD-10-CM | POA: Diagnosis not present

## 2020-10-13 DIAGNOSIS — M25562 Pain in left knee: Secondary | ICD-10-CM | POA: Diagnosis not present

## 2020-10-13 DIAGNOSIS — R262 Difficulty in walking, not elsewhere classified: Secondary | ICD-10-CM | POA: Diagnosis not present

## 2020-10-13 DIAGNOSIS — M25561 Pain in right knee: Secondary | ICD-10-CM | POA: Diagnosis not present

## 2020-10-13 NOTE — Therapy (Signed)
[place], [place], [address] Phone: [phone]   Fax:  [phone]  Physical Therapy Treatment  Patient Details  Name: Carleena Mires MRN: [id number] Date of Birth: 06-14-1945 Referring Provider (PT): Gar Ponto, MD   Encounter Date: 10/13/2020   PT End of Session - 10/13/20 0805     Visit Number 14    Number of Visits 24    Date for PT Re-Evaluation 11/11/20    Authorization Type UHC - MCR    Progress Note Due on Visit 20    PT Start Time 0800    PT Stop Time 0843    PT Time Calculation (min) 43 min    Equipment Utilized During Treatment Gait belt    Activity Tolerance Patient limited by fatigue    Behavior During Therapy [facility] for tasks assessed/performed             Past Medical History:  Diagnosis Date   Allergy    Anxiety    Aortic stenosis    Status post bioprosthetic AVR   Arthritis    DJD   Asthma    Breast cancer ([place]) 12/13/2016   Left breast   Chronic systolic CHF (congestive heart failure) ([place])    EF 30% 04/2014   COPD (chronic obstructive pulmonary disease) ([place])    Coronary artery disease    per pt, had LHC prior to AVR in [place] 2016 that did not show any blockages; no stents/bypass   Gastritis    Hyperlipidemia    Hypertension    Hyperthyroidism    Multiple thyroid nodules    Osteoporosis    Paroxysmal atrial fibrillation ([place])    Pelvis fracture ([place]) 08/19/2015   MULTIPLE    Personal history of radiation therapy 2018   Spontaneous pneumothorax 11/28/2015   left    Stroke ([place]) 2000   rt hand weak    Past Surgical History:  Procedure Laterality Date   BREAST LUMPECTOMY Left 12/2016   BREAST LUMPECTOMY WITH RADIOACTIVE SEED AND SENTINEL LYMPH NODE BIOPSY Left 01/14/2017   Procedure: LEFT BREAST LUMPECTOMY WITH RADIOACTIVE SEED AND SENTINEL LYMPH NODE BIOPSY;  Surgeon: Rolm Bookbinder, MD;  Location: [place];  Service: General;   Laterality: Left;   CARDIAC CATHETERIZATION     CHEST TUBE INSERTION  10/2015   INTRAMEDULLARY (IM) NAIL INTERTROCHANTERIC Left 12/10/2015   Procedure: INTRAMEDULLARY (IM) NAIL INTERTROCHANTRIC;  Surgeon: Leandrew Koyanagi, MD;  Location: [place];  Service: Orthopedics;  Laterality: Left;   PLEURADESIS Left 12/03/2015   Procedure: PLEURADESIS;  Surgeon: Melrose Nakayama, MD;  Location: [place];  Service: Thoracic;  Laterality: Left;   RESECTION OF APICAL BLEB Left 12/03/2015   Procedure: BLEBECTOMY;  Surgeon: Melrose Nakayama, MD;  Location: [place];  Service: Thoracic;  Laterality: Left;   RIGHT/LEFT HEART CATH AND CORONARY ANGIOGRAPHY N/A 03/10/2020   Procedure: RIGHT/LEFT HEART CATH AND CORONARY ANGIOGRAPHY;  Surgeon: [name], MD;  Location: [place] CV LAB;  Service: Cardiovascular;  Laterality: N/A;   THORACOSCOPY  12/03/2015   VALVE REPLACEMENT     VIDEO ASSISTED THORACOSCOPY Left 12/03/2015   Procedure: VIDEO ASSISTED THORACOSCOPY;  Surgeon: Melrose Nakayama, MD;  Location: [place];  Service: Thoracic;  Laterality: Left;    There were no vitals filed for this visit.   Subjective Assessment - 10/13/20 0806     Subjective The patient reports that her right knee is bothering her again today as it is rainy out.  Pertinent History OP, COPD, pneumothorax, arthritis, Obesity, Corn[place] Artery disease, breast cancer    Currently in Pain? Yes    Pain Score 6     Pain Location Knee    Pain Orientation Right    Pain Descriptors / Indicators Aching                [facility] PT Assessment - 10/13/20 0001       Assessment   Medical Diagnosis R26.9 (ICD-10-CM) - Gait abnormality  R20.2 (ICD-10-CM) - Paresthesia  M54.42,M54.41 (ICD-10-CM) - Low back pain with bilateral sciatica, unspecified back pain laterality, unspecified chronicity    Referring Provider (PT) Danae Chen, Luanna Salk, MD      Strength   Right Hip Flexion 4+/5    Right Hip ABduction 4+/5    Right Hip ADduction 4+/5     Left Hip Flexion 4-/5    Left Hip ABduction 4-/5    Left Hip ADduction 4+/5    Right Knee Flexion 4+/5    Right Knee Extension 4+/5    Left Knee Flexion 4+/5    Left Knee Extension 4+/5                           [facility] Adult PT Treatment/Exercise - 10/13/20 0001       Lumbar Exercises: Stretches   Passive Hamstring Stretch 30 seconds;2 reps;Left;Right    Passive Hamstring Stretch Limitations Sitting      Lumbar Exercises: Aerobic   Nustep L5 x 5 min  LE only      Knee/Hip Exercises: Standing   Heel Raises Both;20 reps    Heel Raises Limitations in // bars    Hip Flexion 20 reps;2 sets    Hip Flexion Limitations in // bars alternating    Hip Abduction Right;Left;10 reps;2 sets    Abduction Limitations cuing to stand up right in // bars    Functional Squat 10 reps;2 sets    Functional Squat Limitations mini squat in // bars    Other Standing Knee Exercises AirEx step up x 5 x 1 each    Other Standing Knee Exercises Side stepping in // bars x 2 laps      Knee/Hip Exercises: Seated   Long Arc Quad Right;Left;2 sets;20 reps    Long Arc Quad Weight 4 lbs.                      PT Short Term Goals - 09/16/20 0948       PT SHORT TERM GOAL #1   Title The patient will be independent in an initial HEP of sitting exercises    Baseline met    Time 3    Period Weeks    Status Achieved    Target Date 09/09/20               PT Long Term Goals - 09/16/20 0948       PT LONG TERM GOAL #1   Title The patient will be able to transfer sit to stand x 5 reps without use of hands in < or equal to 18 seconds.    Baseline 5 x STS 43.34 sec  ( risk of fall 15 sec or more) Pt must use hands    Time 12    Period Weeks    Status On-going      PT LONG TERM GOAL #2   Title The patient will be able to ambulate x 200  without SOB and use of FWW for community ambulation.    Baseline Pt with SOB walking 120 feet today    Time 12    Period Weeks    Status  On-going      PT LONG TERM GOAL #3   Title The patient will improve FOTO outcome score to 46% for improvement of functional home activities.    Baseline 30% eval    Time 12    Period Weeks    Status On-going      PT LONG TERM GOAL #4   Title The patient will be able to stand for 20 minutes with low back pain under or equal to 3/10 and use of hands for support    Baseline unable to stand without use of hands and pain 7/10    Time 12    Period Weeks    Status On-going      PT LONG TERM GOAL #5   Title Pt will gross LE MMT to 4+/5 for improved strength.    Baseline see flow chart MMT    Time 8    Period Weeks    Status On-going                   Plan - 10/13/20 0805     Clinical Impression Statement Geralindine did need less breaks today and she demonstrated less SOB with the exercises.  The patient's walking distance as 80 feet and she demonstrated only mild SOB.  The patient reports doing her exercises at home.  Her LE strength with manual muscle testing has remained the pain.  Recommend re-assessment again next session and review of HEP for possible DC to HEP.    Personal Factors and Comorbidities Comorbidity 3+;Fitness    Comorbidities OP, COPD, Coronary artery disease, obesity, arthritis    Examination-Activity Limitations Bathing;Locomotion Level;Transfers;Bed Mobility;Caring for Others;Carry;Squat;Dressing;Stairs;Stand;Toileting;Lift;Sleep    Examination-Participation Restrictions Church;Cleaning;Meal Prep;Community Activity;Laundry;Shop;Volunteer;[place] [employer] Work    PT Treatment/Interventions Aquatic Therapy;ADLs/Self Care Home Management;Moist Heat;Cryotherapy;Gait training;Stair training;Functional mobility training;Therapeutic activities;Therapeutic exercise;Balance training;Neuromuscular re-education;Manual techniques;Orthotic Fit/Training;Patient/family education;Dry needling;Passive range of motion;Energy conservation    PT Next Visit Plan re-assess for DC to HEP    PT  Home Exercise Plan Access Code: [email or web address]    Consulted and Agree with Plan of Care Patient             Patient will benefit from skilled therapeutic intervention in order to improve the following deficits and impairments:  Abnormal gait, Difficulty walking, Cardiopulmonary status limiting activity, Decreased endurance, Obesity, Decreased activity tolerance, Decreased knowledge of precautions, Decreased skin integrity, Decreased balance, Pain, Decreased mobility, Decreased strength  Visit Diagnosis: Difficulty in walking, not elsewhere classified  Muscle weakness (generalized)  Abnormal posture     Problem List Patient Active Problem List   Diagnosis Date Noted   Gait abnormality 07/31/2020   Paresthesia 07/31/2020   Low back pain with bilateral sciatica 07/31/2020   Pneumonia due to COVID-19 virus    Bacteremia due to Gram-positive bacteria 08/29/2018   Abscess    Cellulitis of left breast 08/28/2018   Cellulitis of left abdominal wall 08/28/2018   Encounter for routine gynecological examination 04/28/2018   CKD (chronic kidney disease), stage III ([place]) 09/27/2017   Recurrent genital herpes 04/13/2017   Chronic pain disorder 01/25/2017   Malignant neoplasm of upper-outer quadrant of left breast in female, estrogen receptor positive ([place]) 12/16/2016   Chronic knee pain 12/07/2016   Chronic bilateral low back pain without sciatica 12/07/2016   Peripheral musculoskeletal gait  disorder 12/07/2016   Encounter for therapeutic drug monitoring 06/16/2016   Generalized osteoarthritis of multiple sites 05/04/2016   Chronic anticoagulation 05/04/2016   Stroke ([place]) 04/14/2016   Aortic valve disease 04/10/2016   S/P AVR 04/10/2016   NICM (nonischemic cardiomyopathy) ([place]) 04/10/2016   Upper airway cough syndrome 03/14/2016   Morbid obesity ([place]) 03/14/2016   COPD (chronic obstructive pulmonary disease) ([place]) 03/11/2016   Thrombocytopenia ([place]) 12/12/2015   Anemia 12/12/2015    Vitamin D deficiency 12/12/2015   Acute urinary retention 12/11/2015   Osteoporosis 12/11/2015   Hip fracture ([place]) 12/10/2015   Aortic atherosclerosis ([place]) 12/10/2015   Lung blebs ([place]) 12/03/2015   Pelvic fracture ([place]) 08/19/2015   Fall at home 08/19/2015   Essential hypertension 08/19/2015   Mixed hyperlipidemia 08/19/2015   Asthma 08/19/2015   Paroxysmal atrial fibrillation ([place]) 08/19/2015   Hyperthyroidism 08/19/2015   Chronic HFrEF (heart failure with reduced ejection fraction) ([facility])    Coronary artery disease    Aortic stenosis    Rich Number, PT, DPT, OCS, Crt. DN  Bethena Midget 10/13/2020, 8:46 AM  [facility] [address] [place], [place], [address] Phone: [phone]   Fax:  [phone]  Name: Shirlean Berman MRN: [id number] Date of Birth: 21-Sep-1945

## 2020-10-14 ENCOUNTER — Ambulatory Visit: Payer: Medicare Other | Admitting: Obstetrics and Gynecology

## 2020-10-14 ENCOUNTER — Ambulatory Visit (INDEPENDENT_AMBULATORY_CARE_PROVIDER_SITE_OTHER): Payer: Medicare Other

## 2020-10-14 VITALS — BP 120/84 | HR 80 | Ht 67.0 in | Wt 257.0 lb

## 2020-10-14 DIAGNOSIS — Z79810 Long term (current) use of selective estrogen receptor modulators (SERMs): Secondary | ICD-10-CM | POA: Diagnosis not present

## 2020-10-14 DIAGNOSIS — N859 Noninflammatory disorder of uterus, unspecified: Secondary | ICD-10-CM | POA: Diagnosis not present

## 2020-10-14 DIAGNOSIS — R9389 Abnormal findings on diagnostic imaging of other specified body structures: Secondary | ICD-10-CM | POA: Diagnosis not present

## 2020-10-14 DIAGNOSIS — N950 Postmenopausal bleeding: Secondary | ICD-10-CM | POA: Diagnosis not present

## 2020-10-14 NOTE — Progress Notes (Signed)
GYNECOLOGY  VISIT   HPI: 75 y.o.   Married  [place] American  female   G0P0000 with No LMP recorded. Patient is postmenopausal.   here for pelvic ultrasound done for post coital postmenopausal bleeding.    Patient is on Tamoxifen.   Cardiologist is Dr. Saunders Revel. She also has heart failure specialist.  Oncologist is Dr. Burr Medico.   GYNECOLOGIC HISTORY: No LMP recorded. Patient is postmenopausal. Contraception: PMP Menopausal hormone therapy:  n/a Last mammogram:  01-16-20  Diag.Bil./status post Lt.lumpectomy 2018/Neg/BiRads2 Last pap smear: 08-20-20 Neg:Neg HR HPV. Years ago normal always        OB History     Gravida  0   Para  0   Term  0   Preterm  0   AB  0   Living  0      SAB  0   IAB  0   Ectopic  0   Multiple  0   Live Births  0              Patient Active Problem List   Diagnosis Date Noted   Gait abnormality 07/31/2020   Paresthesia 07/31/2020   Low back pain with bilateral sciatica 07/31/2020   Pneumonia due to COVID-19 virus    Bacteremia due to Gram-positive bacteria 08/29/2018   Abscess    Cellulitis of left breast 08/28/2018   Cellulitis of left abdominal wall 08/28/2018   Encounter for routine gynecological examination 04/28/2018   CKD (chronic kidney disease), stage III ([place]) 09/27/2017   Recurrent genital herpes 04/13/2017   Chronic pain disorder 01/25/2017   Malignant neoplasm of upper-outer quadrant of left breast in female, estrogen receptor positive ([place]) 12/16/2016   Chronic knee pain 12/07/2016   Chronic bilateral low back pain without sciatica 12/07/2016   Peripheral musculoskeletal gait disorder 12/07/2016   Encounter for therapeutic drug monitoring 06/16/2016   Generalized osteoarthritis of multiple sites 05/04/2016   Chronic anticoagulation 05/04/2016   Stroke ([place]) 04/14/2016   Aortic valve disease 04/10/2016   S/P AVR 04/10/2016   NICM (nonischemic cardiomyopathy) ([place]) 04/10/2016   Upper airway cough syndrome 03/14/2016    Morbid obesity ([place]) 03/14/2016   COPD (chronic obstructive pulmonary disease) ([place]) 03/11/2016   Thrombocytopenia ([place]) 12/12/2015   Anemia 12/12/2015   Vitamin D deficiency 12/12/2015   Acute urinary retention 12/11/2015   Osteoporosis 12/11/2015   Hip fracture ([place]) 12/10/2015   Aortic atherosclerosis ([place]) 12/10/2015   Lung blebs ([place]) 12/03/2015   Pelvic fracture ([place]) 08/19/2015   Fall at home 08/19/2015   Essential hypertension 08/19/2015   Mixed hyperlipidemia 08/19/2015   Asthma 08/19/2015   Paroxysmal atrial fibrillation ([place]) 08/19/2015   Hyperthyroidism 08/19/2015   Chronic HFrEF (heart failure with reduced ejection fraction) ([place])    Coronary artery disease    Aortic stenosis     Past Medical History:  Diagnosis Date   Allergy    Anxiety    Aortic stenosis    Status post bioprosthetic AVR   Arthritis    DJD   Asthma    Breast cancer ([place]) 12/13/2016   Left breast   Chronic systolic CHF (congestive heart failure) ([name])    EF 30% 04/2014   COPD (chronic obstructive pulmonary disease) ([place])    Coronary artery disease    per pt, had LHC prior to AVR in [place] 2016 that did not show any blockages; no stents/bypass   Gastritis    Hyperlipidemia    Hypertension    Hyperthyroidism  Multiple thyroid nodules    Osteoporosis    Paroxysmal atrial fibrillation ([facility])    Pelvis fracture ([place]) 08/19/2015   MULTIPLE    Personal history of radiation therapy 2018   Spontaneous pneumothorax 11/28/2015   left    Stroke [facility]) 2000   rt hand weak    Past Surgical History:  Procedure Laterality Date   BREAST LUMPECTOMY Left 12/2016   BREAST LUMPECTOMY WITH RADIOACTIVE SEED AND SENTINEL LYMPH NODE BIOPSY Left 01/14/2017   Procedure: LEFT BREAST LUMPECTOMY WITH RADIOACTIVE SEED AND SENTINEL LYMPH NODE BIOPSY;  Surgeon: Rolm Bookbinder, MD;  Location: [name];  Service: General;  Laterality: Left;   CARDIAC CATHETERIZATION     CHEST TUBE INSERTION  10/2015    INTRAMEDULLARY (IM) NAIL INTERTROCHANTERIC Left 12/10/2015   Procedure: INTRAMEDULLARY (IM) NAIL INTERTROCHANTRIC;  Surgeon: Leandrew Koyanagi, MD;  Location: [place];  Service: Orthopedics;  Laterality: Left;   PLEURADESIS Left 12/03/2015   Procedure: PLEURADESIS;  Surgeon: Melrose Nakayama, MD;  Location: [place];  Service: Thoracic;  Laterality: Left;   RESECTION OF APICAL BLEB Left 12/03/2015   Procedure: BLEBECTOMY;  Surgeon: Melrose Nakayama, MD;  Location: [place];  Service: Thoracic;  Laterality: Left;   RIGHT/LEFT HEART CATH AND CORONARY ANGIOGRAPHY N/A 03/10/2020   Procedure: RIGHT/LEFT HEART CATH AND CORONARY ANGIOGRAPHY;  Surgeon: [name], MD;  Location: [place] [facility];  Service: Cardiovascular;  Laterality: N/A;   THORACOSCOPY  12/03/2015   VALVE REPLACEMENT     VIDEO ASSISTED THORACOSCOPY Left 12/03/2015   Procedure: VIDEO ASSISTED THORACOSCOPY;  Surgeon: Melrose Nakayama, MD;  [facility];  Service: Thoracic;  Laterality: Left;    Current Outpatient Medications  Medication Sig Dispense Refill   albuterol (PROVENTIL HFA;VENTOLIN HFA) 108 (90 Base) MCG/ACT inhaler Inhale 2 puffs into the lungs every 6 (six) hours as needed for wheezing or shortness of breath.      alendronate (FOSAMAX) 70 MG tablet Take 1 tablet (70 mg total) by mouth once a week. Take with a full glass of water on an empty stomach. 13 tablet 3   ascorbic acid (VITAMIN C) 500 MG tablet Take 500 mg by mouth daily.     budesonide-formoterol (SYMBICORT) 160-4.5 MCG/ACT inhaler Inhale 2 puffs into the lungs daily as needed.     carvedilol (COREG) 12.5 MG tablet Take 12.5 mg by mouth 2 (two) times daily.     Cholecalciferol 25 MCG (1000 UT) tablet Take 1,000 Units by mouth daily.     dapagliflozin propanediol (FARXIGA) 10 MG TABS tablet Take 1 tablet (10 mg total) by mouth daily before breakfast. 30 tablet 3   diclofenac sodium (VOLTAREN) 1 % GEL Apply 4 g topically 4 (four) times daily. 500 g 3    diphenhydramine-acetaminophen (TYLENOL PM) 25-500 MG TABS tablet Take 2 tablets by mouth at bedtime.     fluticasone (FLONASE) 50 MCG/ACT nasal spray Place 1 spray into both nostrils daily. 16 g 3   methimazole (TAPAZOLE) 5 MG tablet Take 3 tablets (15 mg total) by mouth daily. 270 tablet 2   pantoprazole (PROTONIX) 40 MG tablet Take 1 tablet (40 mg total) by mouth daily. 30 tablet 11   rosuvastatin (CRESTOR) 40 MG tablet TAKE 1 TABLET BY MOUTH  DAILY 90 tablet 3   sacubitril-valsartan (ENTRESTO) 49-51 MG Take 1 tablet by mouth 2 (two) times daily.     senna-docusate (SENOKOT-S) 8.6-50 MG tablet Take 2 tablets at bedtime by mouth.      tamoxifen (  NOLVADEX) 20 MG tablet Take 1 tablet (20 mg total) by mouth daily. 90 tablet 3   torsemide (DEMADEX) 20 MG tablet Take 1 tablet (20 mg total) by mouth daily. 90 tablet 3   traMADol (ULTRAM) 50 MG tablet Take 1 tablet (50 mg total) by mouth 3 (three) times daily. 90 tablet 5   trolamine salicylate (ASPERCREME) 10 % cream Apply 1 application topically as needed for muscle pain.     valACYclovir (VALTREX) 500 MG tablet Take 500 mg by mouth 2 (two) times daily as needed.     warfarin (COUMADIN) 3 MG tablet Take 1 tablet daily or Take as directed by anticoagulation clinic 90 tablet 1   No current facility-administered medications for this visit.     ALLERGIES: Lisinopril, Tetanus toxoid adsorbed, and Other  Family History  Problem Relation Age of Onset   Diabetes Mother    Heart attack Mother 55   Diabetes Father    Lung cancer Father    Diabetes Sister    Thyroid disease Sister    Diabetes Sister    HIV Brother     Social History   Socioeconomic History   Marital status: Married    Spouse name: [name]   Number of children: 0   Years of education: 12   Highest education level: 12th grade  Occupational History   Occupation: Retired in 2004  Tobacco Use   Smoking status: Former    Packs/day: 0.25    Years: 10.00    Pack years: 2.50     Types: Cigarettes    Quit date: 03/01/2010    Years since quitting: 10.6   Smokeless tobacco: Never  Vaping Use   Vaping Use: Never used  Substance and Sexual Activity   Alcohol use: No   Drug use: No   Sexual activity: Yes    Birth control/protection: Post-menopausal  Other Topics Concern   Not on file  Social History Narrative   Lives with husband.  Ambulated independently.   Right-handed.   No daily caffeine use.   Social Determinants of Health   Financial Resource Strain: Medium Risk   Difficulty of Paying Living Expenses: Somewhat hard  Food Insecurity: No Food Insecurity   Worried About Charity fundraiser in the Last Year: Never true   Ran Out of Food in the Last Year: Never true  Transportation Needs: No Transportation Needs   Lack of Transportation (Medical): No   Lack of Transportation (Non-Medical): No  Physical Activity: Sufficiently Active   Days of Exercise per Week: 7 days   Minutes of Exercise per Session: 30 min  Stress: No Stress Concern Present   Feeling of Stress : Only a little  Social Connections: Moderately Integrated   Frequency of Communication with Friends and Family: More than three times a week   Frequency of Social Gatherings with Friends and Family: More than three times a week   Attends Religious Services: More than 4 times per year   Active Member of [employer] or Organizations: No   Attends Archivist Meetings: Never   Marital Status: Married  Human resources officer Violence: Not At Risk   Fear of Current or Ex-Partner: No   Emotionally Abused: No   Physically Abused: No   Sexually Abused: No    Review of Systems  All other systems reviewed and are negative.  PHYSICAL EXAMINATION:    BP 120/84   Pulse 80   Ht '5\' 7"'$  (1.702 m)   Wt 257 lb (116.6  kg)   SpO2 98%   BMI 40.25 kg/m     General appearance: alert, cooperative and appears stated age   Pelvic US  Uterus 6.99 x 4.51 x 4.06 cm.  EMS 11.79 mm.  Thickened walls with  cystic change.  Vascularity noted in the walls.  Clear fluid in endometrial canal.   Ovaries atrophic.  Tubes with serpinginous cystic change, consistent with hydrosalpinges.  Right 5.1 x 1.2 cm.  Left 6.1 x 2.4 cm. No free fluid.    ASSESSMENT  Postmenopausal bleeding.  Thickened endometrium with fluid in endometrial canal.  Left breast cancer.  On Tamoxifen.  Low platelets.  AFib.  On coumadin.  CHF.  COPD.  PLAN  Pelvic ultrasound findings and images reviewed.  Husband present for discussion of planning. Effect of Tamoxifen on the endometrium discussed.  Will attempt office endometrial biopsy.  Will reach out to cardiology for recommendation for coumadin treatment around the time of her biopsy.   If unable to do successful sampling of endometrium in the office, will schedule hysteroscopy with dilation and curettage.   An After Visit Summary was printed and given to the patient.  30 min  total time was spent for this patient encounter, including preparation, face-to-face counseling with the patient, coordination of care, and documentation of the encounter.

## 2020-10-15 ENCOUNTER — Ambulatory Visit: Payer: Medicare Other

## 2020-10-15 ENCOUNTER — Other Ambulatory Visit: Payer: Self-pay

## 2020-10-15 DIAGNOSIS — M6281 Muscle weakness (generalized): Secondary | ICD-10-CM

## 2020-10-15 DIAGNOSIS — G8929 Other chronic pain: Secondary | ICD-10-CM | POA: Diagnosis not present

## 2020-10-15 DIAGNOSIS — M25561 Pain in right knee: Secondary | ICD-10-CM | POA: Diagnosis not present

## 2020-10-15 DIAGNOSIS — R293 Abnormal posture: Secondary | ICD-10-CM

## 2020-10-15 DIAGNOSIS — M25562 Pain in left knee: Secondary | ICD-10-CM | POA: Diagnosis not present

## 2020-10-15 DIAGNOSIS — R262 Difficulty in walking, not elsewhere classified: Secondary | ICD-10-CM

## 2020-10-15 DIAGNOSIS — R2689 Other abnormalities of gait and mobility: Secondary | ICD-10-CM | POA: Diagnosis not present

## 2020-10-15 NOTE — Patient Instructions (Signed)
Access Code: [email or web address] URL: [email or web address] Date: 10/15/2020 Prepared by: Rich Number  Exercises Seated Hip Abduction with Resistance - 1 x daily - 7 x weekly - 2 sets - 10 reps - 5 hold Supine Posterior Pelvic Tilt - 1 x daily - 7 x weekly - 2 sets - 10 reps - 3 hold Beginner Bridge - 1 x daily - 7 x weekly - 2 sets - 10 reps - 3 hold Active Straight Leg Raise with Quad Set - 1 x daily - 7 x weekly - 2 sets - 10 reps - 3 hold Sit to Stand with Counter Support - 3 x daily - 7 x weekly - 1 sets - 5-10 reps Seated Knee Extension with Resistance - 1 x daily - 7 x weekly - 2 sets - 10 reps Heel Toe Raises with Unilateral Counter Support - 1 x daily - 7 x weekly - 2 sets - 10 reps Heel Toe Raises with Counter Support - 1 x daily - 7 x weekly - 2 sets - 10 reps Mini Squat with Counter Support - 1 x daily - 7 x weekly - 2 sets - 10 reps Standing March with Counter Support - 1 x daily - 7 x weekly - 2 sets - 20 reps Seated Gastroc Stretch with Strap - 1 x daily - 7 x weekly - 1 sets - 2 reps - 20 hold Seated Hamstring Stretch - 1 x daily - 7 x weekly - 1 sets - 2 reps - 20 hold

## 2020-10-15 NOTE — Therapy (Signed)
[place], [place], [address] Phone: [phone]   Fax:  [phone]  Physical Therapy Treatment / Discharge  Patient Details  Name: Danielle [name] MRN: [id number] Date of Birth: Apr 15, 1945 Referring Provider (PT): Danae Chen, Luanna Salk, MD  PHYSICAL THERAPY DISCHARGE SUMMARY  Visits from Start of Care: 15  Current functional level related to goals / functional outcomes: FOTO 40%   Remaining deficits: Reduce Endurance, Lower extremity weakness   Education / Equipment: HEP, 4 wheeled walker for ambulation   Patient agrees to discharge. Patient goals were partially met. Patient is being discharged due to lack of progress.  Encounter Date: 10/15/2020   PT End of Session - 10/15/20 0746     Visit Number 15    Number of Visits 24    Date for PT Re-Evaluation 11/11/20    Authorization Type UHC - MCR    Authorization Time Period 10th visit FOTO/reval    PT Start Time 0746    PT Stop Time 0845    PT Time Calculation (min) 59 min    Activity Tolerance Patient limited by fatigue    Behavior During Therapy [facility] for tasks assessed/performed             Past Medical History:  Diagnosis Date   Allergy    Anxiety    Aortic stenosis    Status post bioprosthetic AVR   Arthritis    DJD   Asthma    Breast cancer ([place]) 12/13/2016   Left breast   Chronic systolic CHF (congestive heart failure) ([place])    EF 30% 04/2014   COPD (chronic obstructive pulmonary disease) ([place])    Coronary artery disease    per pt, had LHC prior to AVR in [place] 2016 that did not show any blockages; no stents/bypass   Gastritis    Hyperlipidemia    Hypertension    Hyperthyroidism    Multiple thyroid nodules    Osteoporosis    Paroxysmal atrial fibrillation ([place])    Pelvis fracture ([place]) 08/19/2015   MULTIPLE    Personal history of radiation therapy 2018   Spontaneous pneumothorax 11/28/2015   left     Stroke ([place]) 2000   rt hand weak    Past Surgical History:  Procedure Laterality Date   BREAST LUMPECTOMY Left 12/2016   BREAST LUMPECTOMY WITH RADIOACTIVE SEED AND SENTINEL LYMPH NODE BIOPSY Left 01/14/2017   Procedure: LEFT BREAST LUMPECTOMY WITH RADIOACTIVE SEED AND SENTINEL LYMPH NODE BIOPSY;  Surgeon: Rolm Bookbinder, MD;  Location: [place];  Service: General;  Laterality: Left;   CARDIAC CATHETERIZATION     CHEST TUBE INSERTION  10/2015   INTRAMEDULLARY (IM) NAIL INTERTROCHANTERIC Left 12/10/2015   Procedure: INTRAMEDULLARY (IM) NAIL INTERTROCHANTRIC;  Surgeon: Leandrew Koyanagi, MD;  Location: [place];  Service: Orthopedics;  Laterality: Left;   PLEURADESIS Left 12/03/2015   Procedure: PLEURADESIS;  Surgeon: Melrose Nakayama, MD;  Location: [place];  Service: Thoracic;  Laterality: Left;   RESECTION OF APICAL BLEB Left 12/03/2015   Procedure: BLEBECTOMY;  Surgeon: Melrose Nakayama, MD;  Location: [place];  Service: Thoracic;  Laterality: Left;   RIGHT/LEFT HEART CATH AND CORONARY ANGIOGRAPHY N/A 03/10/2020   Procedure: RIGHT/LEFT HEART CATH AND CORONARY ANGIOGRAPHY;  Surgeon: [name], MD;  Location: [place] [facility];  Service: Cardiovascular;  Laterality: N/A;   THORACOSCOPY  12/03/2015   VALVE REPLACEMENT     VIDEO ASSISTED THORACOSCOPY Left 12/03/2015   Procedure: VIDEO ASSISTED THORACOSCOPY;  Surgeon: Melrose Nakayama, MD;  Location: [place];  Service: Thoracic;  Laterality: Left;    There were no vitals filed for this visit.   Subjective Assessment - 10/15/20 0747     Subjective " I did my exercises last night which I think is why I am sore today. my R knee is at about a 6/10."    Pertinent History OP, COPD, pneumothorax, arthritis, Obesity, Cornary Artery disease, breast cancer    Limitations Lifting;Standing;Walking;House hold activities    How long can you stand comfortably? 15-20 minutes    Currently in Pain? Yes    Pain Score 6     Aggravating Factors   standing/ walking, liflting, putting on shoes    Pain Relieving Factors sitting and medication                [facility] PT Assessment - 10/15/20 0001       Assessment   Medical Diagnosis R26.9 (ICD-10-CM) - Gait abnormality  R20.2 (ICD-10-CM) - Paresthesia  M54.42,M54.41 (ICD-10-CM) - Low back pain with bilateral sciatica, unspecified back pain laterality, unspecified chronicity    Referring Provider (PT) Kristeins, Luanna Salk, MD      Observation/Other Assessments   Focus on Therapeutic Outcomes (FOTO)  40%      Strength   Right Hip Flexion 4+/5    Right Hip ABduction 4+/5    Right Hip ADduction 4+/5    Left Hip Flexion 4-/5    Left Hip ABduction 4-/5    Left Hip ADduction 4+/5    Right Knee Flexion 4+/5    Right Knee Extension 4+/5    Left Knee Flexion 4+/5    Left Knee Extension 4+/5      Ambulation/Gait   Ambulation/Gait Yes    Ambulation/Gait Assistance 5: Supervision    Ambulation/Gait Assistance Details SOB    Ambulation Distance (Feet) 100 Feet    Assistive device 4-wheeled walker    Gait Pattern Shuffle;Trunk flexed                           [facility] Adult PT Treatment/Exercise - 10/15/20 0001       Transfers   Five time sit to stand comments  29 seconds, use of hands bilaterally.      Lumbar Exercises: Stretches   Passive Hamstring Stretch 2 reps;Right;30 seconds    Gastroc Stretch 20 seconds;2 reps;Right;Left      Lumbar Exercises: Aerobic   Nustep L4 x 5 min LE only      Lumbar Exercises: Standing   Heel Raises 20 reps      Knee/Hip Exercises: Standing   Hip Flexion Both      Knee/Hip Exercises: Seated   Long Arc Quad Right;Left;2 sets;20 reps    Long Arc Quad Limitations GTB                    PT Education - 10/15/20 1321     Education Details updated HEP.    Person(s) Educated Patient;Spouse    Methods Explanation;Handout    Comprehension Verbalized understanding              PT Short Term Goals - 09/16/20  0948       PT SHORT TERM GOAL #1   Title The patient will be independent in an initial HEP of sitting exercises    Baseline met    Time 3    Period Weeks    Status Achieved  Target Date 09/09/20               PT Long Term Goals - 10/15/20 0817       PT LONG TERM GOAL #1   Title The patient will be able to transfer sit to stand x 5 reps without use of hands in < or equal to 18 seconds.    Baseline 5 x STS 43.34 sec  ( risk of fall 15 sec or more) Pt must use hands 8/17: 29 seconds    Time 12    Period Weeks    Status Partially Met      PT LONG TERM GOAL #2   Title The patient will be able to ambulate x 200 without SOB and use of FWW for community ambulation.    Baseline Pt with SOB walking 120 feet    Time 12    Period Weeks    Status On-going      PT LONG TERM GOAL #3   Title The patient will improve FOTO outcome score to 46% for improvement of functional home activities.    Baseline 30% eval    Time 12    Period Weeks    Status On-going      PT LONG TERM GOAL #4   Title The patient will be able to stand for 20 minutes with low back pain under or equal to 3/10 and use of hands for support    Baseline unable to stand without use of hands and pain 7/10    Time 12    Period Weeks    Status Achieved      PT LONG TERM GOAL #5   Title Pt will gross LE MMT to 4+/5 for improved strength.    Baseline see flow chart MMT    Time 8    Period Weeks    Status Partially Met                   Plan - 10/15/20 0802     Clinical Impression Statement Danielle [name] has attended 15 sessions of physical therapy.  Her progression is limited due to SOB and fatigue.  The patient's walking distance today was limited to 100 feet with her 4-wheeled walker.  The patient has made improvement of her sit to stand time.  The patient requires use of her hands bilaterally for sit to stand transfers.  She has a well established HEP.  Recommend that the patient continue with her current  HEP for further strengthening.  She may resume therapy in the future for futher progression of her exercises and activities.    Personal Factors and Comorbidities Comorbidity 3+;Fitness    Comorbidities OP, COPD, Coronary artery disease, obesity, arthritis    Examination-Activity Limitations Bathing;Locomotion Level;Transfers;Bed Mobility;Caring for Others;Carry;Squat;Dressing;Stairs;Stand;Toileting;Lift;Sleep    Examination-Participation Restrictions Church;Cleaning;Meal Prep;Community Activity;Laundry;Shop;Volunteer;[place] Work    PT Treatment/Interventions Aquatic Therapy;ADLs/Self Care Home Management;Moist Heat;Cryotherapy;Gait training;Stair training;Functional mobility training;Therapeutic activities;Therapeutic exercise;Balance training;Neuromuscular re-education;Manual techniques;Orthotic Fit/Training;Patient/family education;Dry needling;Passive range of motion;Energy conservation    PT Next Visit Plan re-assess for DC to HEP    PT Home Exercise Plan Access Code: [email or web address]    Consulted and Agree with Plan of Care Patient    Family Member Consulted husband             Patient will benefit from skilled therapeutic intervention in order to improve the following deficits and impairments:  Abnormal gait, Difficulty walking, Cardiopulmonary status limiting activity, Decreased endurance, Obesity, Decreased activity tolerance, Decreased knowledge of precautions,  Decreased skin integrity, Decreased balance, Pain, Decreased mobility, Decreased strength  Visit Diagnosis: Difficulty in walking, not elsewhere classified  Muscle weakness (generalized)  Abnormal posture     Problem List Patient Active Problem List   Diagnosis Date Noted   Gait abnormality 07/31/2020   Paresthesia 07/31/2020   Low back pain with bilateral sciatica 07/31/2020   Pneumonia due to COVID-19 virus    Bacteremia due to Gram-positive bacteria 08/29/2018   Abscess    Cellulitis of left breast 08/28/2018    Cellulitis of left abdominal wall 08/28/2018   Encounter for routine gynecological examination 04/28/2018   CKD (chronic kidney disease), stage III ([place]) 09/27/2017   Recurrent genital herpes 04/13/2017   Chronic pain disorder 01/25/2017   Malignant neoplasm of upper-outer quadrant of left breast in female, estrogen receptor positive ([place]) 12/16/2016   Chronic knee pain 12/07/2016   Chronic bilateral low back pain without sciatica 12/07/2016   Peripheral musculoskeletal gait disorder 12/07/2016   Encounter for therapeutic drug monitoring 06/16/2016   Generalized osteoarthritis of multiple sites 05/04/2016   Chronic anticoagulation 05/04/2016   Stroke ([place]) 04/14/2016   Aortic valve disease 04/10/2016   S/P AVR 04/10/2016   NICM (nonischemic cardiomyopathy) ([place]) 04/10/2016   Upper airway cough syndrome 03/14/2016   Morbid obesity ([place]) 03/14/2016   COPD (chronic obstructive pulmonary disease) ([place]) 03/11/2016   Thrombocytopenia ([place]) 12/12/2015   Anemia 12/12/2015   Vitamin D deficiency 12/12/2015   Acute urinary retention 12/11/2015   Osteoporosis 12/11/2015   Hip fracture ([place]) 12/10/2015   Aortic atherosclerosis ([place]) 12/10/2015   Lung blebs ([place]) 12/03/2015   Pelvic fracture ([place]) 08/19/2015   Fall at home 08/19/2015   Essential hypertension 08/19/2015   Mixed hyperlipidemia 08/19/2015   Asthma 08/19/2015   Paroxysmal atrial fibrillation ([place]) 08/19/2015   Hyperthyroidism 08/19/2015   Chronic HFrEF (heart failure with reduced ejection fraction) ([facility])    Coronary artery disease    Aortic stenosis    Rich Number, PT, DPT, OCS, Crt. DN Bethena Midget 10/15/2020, 1:25 PM  [facility] [address] [place], [place], [address] Phone: [phone]   Fax:  [phone]  Name: Danielle [name] MRN: [id number] Date of Birth: 1945-05-03

## 2020-10-16 ENCOUNTER — Telehealth: Payer: Self-pay | Admitting: Obstetrics and Gynecology

## 2020-10-16 ENCOUNTER — Telehealth: Payer: Self-pay | Admitting: Internal Medicine

## 2020-10-16 ENCOUNTER — Encounter: Payer: Self-pay | Admitting: Obstetrics and Gynecology

## 2020-10-16 NOTE — Telephone Encounter (Signed)
I called Dr. Darnelle Bos office. She said I would need to fax a medical clearance form for medication recommendation and provided info it should include.  I faxed that to their office and received fax confirmation.

## 2020-10-16 NOTE — Telephone Encounter (Signed)
Please reach out to patient's cardiologist, Dr. Harrell Gave End, regarding recommendations for upcoming office endometrial biopsy on 10/20/20.  The patient has postmenopausal bleeding and has thickened endometrium. I will need to do paracervical injections of lidocaine and likely dilate her stenotic cervix in order to perform the biopsy.   She takes coumadin.   I need a recommendation for when she should stop the coumadin and then restart it.

## 2020-10-16 NOTE — Telephone Encounter (Signed)
   [facility] Pre-operative Risk Assessment    Patient Name: Danielle [name]  DOB: 10-05-1945 MRN: [id number]  HEARTCARE STAFF:  - IMPORTANT!!!!!! Under Visit Info/Reason for Call, type in Other and utilize the format Clearance MM/DD/YY or Clearance TBD. Do not use dashes or single digits. - Please review there is not already an duplicate clearance open for this procedure. - If request is for dental extraction, please clarify the # of teeth to be extracted. - If the patient is currently at the dentist's office, call Pre-Op Callback Staff (MA/nurse) to input urgent request.  - If the patient is not currently in the dentist office, please route to the Pre-Op pool.  Request for surgical clearance:  What type of surgery is being performed? Endometrial biopsy for thickened endometrium (Stenotic Cervix)  When is this surgery scheduled?  10/20/20  What type of clearance is required (medical clearance vs. Pharmacy clearance to hold med vs. Both)? both  Are there any medications that need to be held prior to surgery and how long? Coumadin instructions   Practice name and name of physician performing surgery? [facility] - Dr Josefa Half  What is the office phone number? [phone]   7.   What is the office fax number? [phone]  8.   Anesthesia type (None, local, MAC, general) ? Not listed    Danielle [name] 10/16/2020, 2:06 PM  _________________________________________________________________   (provider comments below)

## 2020-10-16 NOTE — Progress Notes (Signed)
PCP: [name], [name], MD Cardiology: Dr. Saunders Revel HF cardiology: Dr. Aundra Dubin  75 y.o. with history of aortic insufficiency s/p bioprosthetic aortic valve and CHF was referred by Dr End for evaluation of CHF.  Patient had bioprosthetic aortic valve replacement for aortic insufficiency in 1/16 at [facility] in [place], [place].  Since that time, she has had mild to moderately reduced LV systolic function, as low as 30-35% and as high at 45-50%.  Most recent echo in 9/21 showed EF 45-50%.  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  She has had paroxysmal atrial fibrillation and 2 prior CVAs.  She is on warfarin.  She has had breast cancer treated with lumpectomy and radiation, no chemotherapy.  She has history of hyperthyroidism and is on methimazole.  She has controlled HTN.    Today she returns for HF follow up and for pre-operative clearance for endometrial biopsy with her son. Still SOB with minimal activity, arrived in [facility] but uses a walker at home for balance. Just finished with her physical therapy. Denies CP, edema. + 3 pillow PND/Orthopnea and benopnea. Some dizziness but says she has vertigo. Appetite ok. No fever or chills. Denies further abnormal bleeding. She has not been weighing at home. She has missed 2 days of her torsemide. BP at home 130s/70s.  REDS clip 52%  ECG (personally reviewed): NSR, 78 bpm  Labs (5/22): K 4.9, creatinine 1.21 Labs (7/22): K 4.0, creatinine 1.60 Labs (8/22): K 4.3, creatinine 1.39  PMH: 1. Left breast cancer: Treated with lumpectomy and radiation, no chemotherapy.  2. Hyperthyroidism 3. Aortic insufficiency: Severe, found when she lived in [place].  She had bioprosthetic aortic valve replacement at [facility] in 1/16.  4. Atrial fibrillation: Paroxysmal. 5. CVA x 2 6. HTN 7. Hyperlipidemia 8. Chronic systolic CHF: Nonischemic cardiomyopathy.  - Echo (3/16): EF 45-50% - Echo (1/17): EF 30-35% - Echo (2/18): EF 35-40% - Echo (11/20):  EF 35-40% - Echo (9/21): EF 45-50%, global hypokinesis, mild LVH, bioprosthetic aortic valve mean gradient 10 mmHg, normal RV - RHC/LHC (1/22): mean RA 9, PA 35/20, mean PCWP 21, CI 3.2.  Nonobstructive CAD.  9. H/o spontaneous PTX  Social History   Socioeconomic History   Marital status: Married    Spouse name: Sherwood   Number of children: 0   Years of education: 12   Highest education level: 12th grade  Occupational History   Occupation: Retired in 2004  Tobacco Use   Smoking status: Former    Packs/day: 0.25    Years: 10.00    Pack years: 2.50    Types: Cigarettes    Quit date: 03/01/2010    Years since quitting: 10.6   Smokeless tobacco: Never  Vaping Use   Vaping Use: Never used  Substance and Sexual Activity   Alcohol use: No   Drug use: No   Sexual activity: Yes    Birth control/protection: Post-menopausal  Other Topics Concern   Not on file  Social History Narrative   Lives with husband.  Ambulated independently.   Right-handed.   No daily caffeine use.   Social Determinants of Health   Financial Resource Strain: Medium Risk   Difficulty of Paying Living Expenses: Somewhat hard  Food Insecurity: No Food Insecurity   Worried About Charity fundraiser in the Last Year: Never true   Ran Out of Food in the Last Year: Never true  Transportation Needs: No Transportation Needs   Lack of Transportation (Medical): No  Lack of Transportation (Non-Medical): No  Physical Activity: Sufficiently Active   Days of Exercise per Week: 7 days   Minutes of Exercise per Session: 30 min  Stress: No Stress Concern Present   Feeling of Stress : Only a little  Social Connections: Moderately Integrated   Frequency of Communication with Friends and Family: More than three times a week   Frequency of Social Gatherings with Friends and Family: More than three times a week   Attends Religious Services: More than 4 times per year   Active Member of [employer] or Organizations: No    Attends Music therapist: Never   Marital Status: Married  Human resources officer Violence: Not At Risk   Fear of Current or Ex-Partner: No   Emotionally Abused: No   Physically Abused: No   Sexually Abused: No   Family History  Problem Relation Age of Onset   Diabetes Mother    Heart attack Mother 23   Diabetes Father    Lung cancer Father    Diabetes Sister    Thyroid disease Sister    Diabetes Sister    HIV Brother    ROS: All systems reviewed and negative except as per HPI.   Current Outpatient Medications  Medication Sig Dispense Refill   albuterol (PROVENTIL HFA;VENTOLIN HFA) 108 (90 Base) MCG/ACT inhaler Inhale 2 puffs into the lungs every 6 (six) hours as needed for wheezing or shortness of breath.      alendronate (FOSAMAX) 70 MG tablet Take 1 tablet (70 mg total) by mouth once a week. Take with a full glass of water on an empty stomach. 13 tablet 3   ascorbic acid (VITAMIN C) 500 MG tablet Take 500 mg by mouth daily.     budesonide-formoterol (SYMBICORT) 160-4.5 MCG/ACT inhaler Inhale 2 puffs into the lungs daily as needed.     carvedilol (COREG) 12.5 MG tablet Take 12.5 mg by mouth 2 (two) times daily.     Cholecalciferol 25 MCG (1000 UT) tablet Take 1,000 Units by mouth daily.     dapagliflozin propanediol (FARXIGA) 10 MG TABS tablet Take 1 tablet (10 mg total) by mouth daily before breakfast. 30 tablet 3   diphenhydramine-acetaminophen (TYLENOL PM) 25-500 MG TABS tablet Take 2 tablets by mouth at bedtime.     fluticasone (FLONASE) 50 MCG/ACT nasal spray Place 1 spray into both nostrils daily. 16 g 3   methimazole (TAPAZOLE) 5 MG tablet Take 3 tablets (15 mg total) by mouth daily. 270 tablet 2   pantoprazole (PROTONIX) 40 MG tablet Take 1 tablet (40 mg total) by mouth daily. 30 tablet 11   rosuvastatin (CRESTOR) 40 MG tablet TAKE 1 TABLET BY MOUTH  DAILY 90 tablet 3   sacubitril-valsartan (ENTRESTO) 49-51 MG Take 1 tablet by mouth 2 (two) times daily.      senna-docusate (SENOKOT-S) 8.6-50 MG tablet Take 2 tablets at bedtime by mouth.      tamoxifen (NOLVADEX) 20 MG tablet Take 1 tablet (20 mg total) by mouth daily. 90 tablet 3   torsemide (DEMADEX) 20 MG tablet Take 1 tablet (20 mg total) by mouth daily. 90 tablet 3   traMADol (ULTRAM) 50 MG tablet Take 1 tablet (50 mg total) by mouth 3 (three) times daily. 90 tablet 5   trolamine salicylate (ASPERCREME) 10 % cream Apply 1 application topically as needed for muscle pain.     valACYclovir (VALTREX) 500 MG tablet Take 500 mg by mouth 2 (two) times daily as needed.  warfarin (COUMADIN) 3 MG tablet Take 1 tablet daily or Take as directed by anticoagulation clinic 90 tablet 1   No current facility-administered medications for this encounter.   Wt Readings from Last 3 Encounters:  10/17/20 118 kg (260 lb 3.2 oz)  10/14/20 116.6 kg (257 lb)  10/01/20 116.6 kg (257 lb)   BP 130/84   Pulse 70   Wt 118 kg (260 lb 3.2 oz)   SpO2 97%   BMI 40.75 kg/m  General:  NAD. No resp difficulty, arrived in [facility] HEENT: Normal Neck: Supple. No JVD. Carotids 2+ bilat; no bruits. No lymphadenopathy or thryomegaly appreciated. Cor: PMI nondisplaced. Regular rate & rhythm. No rubs, gallops or murmurs. Lungs: Clear Abdomen: Obese, nontender, nondistended. No hepatosplenomegaly. No bruits or masses. Good bowel sounds. Extremities: No cyanosis, clubbing, rash, trace LE edema Neuro: Alert & oriented x 3, cranial nerves grossly intact. Moves all 4 extremities w/o difficulty. Affect pleasant.  Assessment/Plan: 1. Chronic HF with mid range EF: Nonischemic cardiomyopathy.  Echo in 9/21 showed EF 45-50%, global hypokinesis, mild LVH, bioprosthetic aortic valve mean gradient 10 mmHg, normal RV.  Cause of cardiomyopathy is uncertain, mild CAD on 1/22 cath.  It is possible that this is chronic effect of long-standing AI that was not improved with valve replacement.  Also consider long-standing HTN or possibly cardiac  amyloidosis (she has LVH and symptoms of peripheral neuropathy).  Her cardiac symptoms are quite impressive, NYHA class IIIb.  She seems significantly limited though some of this may be from deconditioning/body habitus. On exam, she does not appear markedly volume overloaded, weight stable, however ReDs 53%.  She tells me she has missed 2 days of her torsemide. - Start spiro 12.5 mg daily. BMET today and repeat in 10 days. - Continue Entresto 49/51 mg bid. Will hold off increasing today as she has not had her morning medications. - Continue torsemide 20 mg daily.   - Continue Farxiga 10 mg daily. No further yeast infections. - Continue Coreg 12.5 mg bid.  - cMRI has been ordered to assess for evidence for cardiac amyloidosis, myocarditis, etc. Will reach out to scheduler today. - Would benefit from exercise regimen, ?cardiac rehab (but not sure this will be covered without lower EF).  2. Aortic valve disorders: Per report, had severe AI.  Now s/p bioprosthetic aortic valve replacement.  The valve was stable on 9/21 echo.  - Will need antibiotic prophylaxis with dental work.  3. HTN: BP controlled on current regimen.  4. Atrial fibrillation: Paroxysmal.  She is in NSR today, rare palpitations.   - No ASA given warfarin use. - Continue warfarin for now per Coumadin clinic, can consider DOAC in future.  Current data suggests DOAC is reasonable in setting of bioprosthetic valve.  5. Pre-operative clearance: Personally discussed with Dr. Aundra Dubin, she is cleared from a cardiac standpoint for her endometrial biopsy.  - I will forward to the Coumadin Clinic for instructions for pre-surgery recommendation for when she should stop the Coumadin and restart it.  Followup in 2 months with Dr. Aundra Dubin.  [place] FNP 10/17/2020

## 2020-10-16 NOTE — Telephone Encounter (Signed)
Pt was volume up at last visit with adjustments made to her diuretic regimen. Hx of angiography with no CAD.   She has an appt with AHF clinic tomorrow. Please address preop clearance and fax the requesting office with your recommendations.

## 2020-10-17 ENCOUNTER — Other Ambulatory Visit: Payer: Self-pay | Admitting: *Deleted

## 2020-10-17 ENCOUNTER — Other Ambulatory Visit: Payer: Self-pay

## 2020-10-17 ENCOUNTER — Ambulatory Visit (HOSPITAL_COMMUNITY)
Admission: RE | Admit: 2020-10-17 | Discharge: 2020-10-17 | Disposition: A | Discharge status: Home / Self Care | Payer: Medicare Other | Source: Ambulatory Visit | Attending: Family Medicine | Admitting: Family Medicine

## 2020-10-17 ENCOUNTER — Encounter (HOSPITAL_COMMUNITY): Payer: Self-pay

## 2020-10-17 VITALS — BP 130/84 | HR 70 | Wt 260.2 lb

## 2020-10-17 DIAGNOSIS — Z953 Presence of xenogenic heart valve: Secondary | ICD-10-CM | POA: Insufficient documentation

## 2020-10-17 DIAGNOSIS — Z8673 Personal history of transient ischemic attack (TIA), and cerebral infarction without residual deficits: Secondary | ICD-10-CM | POA: Insufficient documentation

## 2020-10-17 DIAGNOSIS — Z87891 Personal history of nicotine dependence: Secondary | ICD-10-CM | POA: Insufficient documentation

## 2020-10-17 DIAGNOSIS — Z79899 Other long term (current) drug therapy: Secondary | ICD-10-CM | POA: Insufficient documentation

## 2020-10-17 DIAGNOSIS — Z7984 Long term (current) use of oral hypoglycemic drugs: Secondary | ICD-10-CM | POA: Insufficient documentation

## 2020-10-17 DIAGNOSIS — Z7951 Long term (current) use of inhaled steroids: Secondary | ICD-10-CM | POA: Diagnosis not present

## 2020-10-17 DIAGNOSIS — Z8249 Family history of ischemic heart disease and other diseases of the circulatory system: Secondary | ICD-10-CM | POA: Diagnosis not present

## 2020-10-17 DIAGNOSIS — I2510 Atherosclerotic heart disease of native coronary artery without angina pectoris: Secondary | ICD-10-CM | POA: Diagnosis not present

## 2020-10-17 DIAGNOSIS — I110 Hypertensive heart disease with heart failure: Secondary | ICD-10-CM | POA: Insufficient documentation

## 2020-10-17 DIAGNOSIS — R002 Palpitations: Secondary | ICD-10-CM | POA: Diagnosis not present

## 2020-10-17 DIAGNOSIS — I10 Essential (primary) hypertension: Secondary | ICD-10-CM

## 2020-10-17 DIAGNOSIS — Z952 Presence of prosthetic heart valve: Secondary | ICD-10-CM | POA: Diagnosis not present

## 2020-10-17 DIAGNOSIS — Z7901 Long term (current) use of anticoagulants: Secondary | ICD-10-CM | POA: Insufficient documentation

## 2020-10-17 DIAGNOSIS — I428 Other cardiomyopathies: Secondary | ICD-10-CM | POA: Diagnosis not present

## 2020-10-17 DIAGNOSIS — I5022 Chronic systolic (congestive) heart failure: Secondary | ICD-10-CM | POA: Diagnosis not present

## 2020-10-17 DIAGNOSIS — Z01818 Encounter for other preprocedural examination: Secondary | ICD-10-CM

## 2020-10-17 DIAGNOSIS — I351 Nonrheumatic aortic (valve) insufficiency: Secondary | ICD-10-CM | POA: Insufficient documentation

## 2020-10-17 DIAGNOSIS — N950 Postmenopausal bleeding: Secondary | ICD-10-CM

## 2020-10-17 DIAGNOSIS — I480 Paroxysmal atrial fibrillation: Secondary | ICD-10-CM | POA: Diagnosis not present

## 2020-10-17 LAB — BASIC METABOLIC PANEL
Anion gap: 4 — ABNORMAL LOW (ref 5–15)
BUN: 11 mg/dL (ref 8–23)
CO2: 24 mmol/L (ref 22–32)
Calcium: 9.4 mg/dL (ref 8.9–10.3)
Chloride: 112 mmol/L — ABNORMAL HIGH (ref 98–111)
Creatinine, Ser: 1.37 mg/dL — ABNORMAL HIGH (ref 0.44–1.00)
GFR, Estimated: 41 mL/min — ABNORMAL LOW (ref >60)
Glucose, Bld: 94 mg/dL (ref 70–99)
Potassium: 4.6 mmol/L (ref 3.5–5.1)
Sodium: 140 mmol/L (ref 135–145)

## 2020-10-17 MED ORDER — SPIRONOLACTONE 25 MG PO TABS: 12.5000 mg | ORAL_TABLET | Freq: Every day | ORAL | 3 refills | Status: DC

## 2020-10-17 NOTE — Telephone Encounter (Signed)
Chart reviewed as part of preoperative cardiac evaluation/protocol.  This patient was seen in the CHF clinic this morning and cleared for her upcoming procedure.  Please resend/fax her office note to the requesting office.  I will remove her from the preop pool.  Thank you.  Jossie Ng. Alexx Giambra NP-C    10/17/2020, 10:54 AM [place] [address] Office [phone] Fax [phone]

## 2020-10-17 NOTE — Telephone Encounter (Signed)
Call placed to patient. Left detailed message. Ok per dpr.   Advised patient OV for 8/22 has been cancelled. Still awaiting confirmation of coumadin instructions from Cardiology. Advised our office will f/u once final confirmation received. Return call to office if any additional questions. My direct number provided. Advised I will be available if any additional questions until 5pm.   Routing to Dr. [name]  Cc: Kristine Linea to f/u on Monday

## 2020-10-17 NOTE — Telephone Encounter (Signed)
Forwarded to requesting party via Epic fax function 

## 2020-10-17 NOTE — Patient Instructions (Signed)
START Spironolactone 12.5 mg, (one half ) tab daily  Labs today We will only contact you if something comes back abnormal or we need to make some changes. Otherwise no news is good news!  Labs needed in 10-14 days  Your physician recommends that you schedule a follow-up appointment in: 2 months with Dr Aundra Dubin  Do the following things EVERYDAY: Weigh yourself in the morning before breakfast. Write it down and keep it in a log. Take your medicines as prescribed Eat low salt foods--Limit salt (sodium) to 2000 mg per day.  Stay as active as you can everyday Limit all fluids for the day to less than 2 liters  milAt the [facility], you and your health needs are our priority. As part of our continuing mission to provide you with exceptional heart care, we have created designated Provider Care Teams. These Care Teams include your primary Cardiologist (physician) and Advanced Practice Providers (APPs- Physician Assistants and Nurse Practitioners) who all work together to provide you with the care you need, when you need it.   You may see any of the following providers on your designated Care Team at your next follow up: Dr Glori Bickers Dr [name] Dr Patrice Paradise, NP Lyda Jester, [place] Ginnie Smart Audry Riles, PharmD   Please be sure to bring in all your medications bottles to every appointment.

## 2020-10-17 NOTE — Telephone Encounter (Signed)
Copy of Dr.End's office note placed on your desk for the below response.

## 2020-10-17 NOTE — Telephone Encounter (Signed)
Patient with diagnosis of atrial fibrillation on warfarin for anticoagulation.    Procedure: endometrial biopsy Date of procedure: 10/20/20   CHA2DS2-VASc Score = 7  This indicates a 11.2% annual risk of stroke. The patient's score is based upon: CHF History: Yes HTN History: Yes Diabetes History: No Stroke History: Yes Vascular Disease History: Yes Age Score: 1 Gender Score: 1   CrCl 48 (with adjusted body weight) Platelet count 86  Per office protocol, patient can hold warfarin for 5 days prior to procedure.    Patient will need bridging with Lovenox (enoxaparin) around procedure. Pt has INR followed by PCP.  Will need to reach out to them for bridging instructions.

## 2020-10-17 NOTE — Telephone Encounter (Signed)
Office calling to check on status

## 2020-10-17 NOTE — Telephone Encounter (Signed)
Call placed to Dr. Darnelle Bos office to f/u with Coumadin recommendations, patient is scheduled for EMB on 8/22. Spoke with Pilar, recommendations pending. She will forward to pre-op for review.

## 2020-10-17 NOTE — Telephone Encounter (Signed)
   Primary Cardiologist: [name], MD  Chart reviewed as part of pre-operative protocol coverage. Given past medical history and time since last visit, based on ACC/AHA guidelines, Danielle [name] would be at acceptable risk for the planned procedure without further cardiovascular testing.   Patient with diagnosis of atrial fibrillation on warfarin for anticoagulation.     Procedure: endometrial biopsy Date of procedure: 10/20/20     CHA2DS2-VASc Score = 7  This indicates a 11.2% annual risk of stroke. The patient's score is based upon: CHF History: Yes HTN History: Yes Diabetes History: No Stroke History: Yes Vascular Disease History: Yes Age Score: 1 Gender Score: 1    CrCl 48 (with adjusted body weight) Platelet count 86   Per office protocol, patient can hold warfarin for 5 days prior to procedure.     Patient will need bridging with Lovenox (enoxaparin) around procedure. Pt has INR followed by PCP.  Will need to reach out to them for bridging instructions.  I will route this recommendation to the requesting party via Epic fax function and remove from pre-op pool.  Please call with questions.  Danielle [name]. Danielle Pacholski NP-C    10/17/2020, 3:10 PM [place] [address] Office [phone] Fax [phone]

## 2020-10-20 ENCOUNTER — Other Ambulatory Visit: Payer: Self-pay | Admitting: Internal Medicine

## 2020-10-20 ENCOUNTER — Ambulatory Visit: Payer: Medicare Other | Admitting: Obstetrics and Gynecology

## 2020-10-20 NOTE — Telephone Encounter (Signed)
Spoke with patient. Advised per Dr. [name].  Patient states she takes her Coumadin daily at HS. She will not take 10/21/20 dose. She is scheduled in the procedure room on 10/22/20 at 1430. Patient verbalizes understanding and is agreeable.   Routing to provider for final review. Patient is agreeable to disposition. Will close encounter.

## 2020-10-20 NOTE — Telephone Encounter (Signed)
Will route to [facility] and Dr. [name] so they are aware of updated recommendation. Will defer their team to notify patient of when to begin holding once procedure scheduled. Will also cc update to [name], RN, at [facility] office who helps manage pt's INR. Please call with questions.

## 2020-10-20 NOTE — Telephone Encounter (Signed)
Ok to hold warfarin for 1 day prior to procedure if it's low bleed risk. Would need to resume as soon as safely possible afterwards given elevated cardiac risk.

## 2020-10-20 NOTE — Telephone Encounter (Signed)
I have reviewed the updated recommendations.   Please schedule office endometrial biopsy with me.   Stop coumadin at least 24 hours prior to procedure.

## 2020-10-20 NOTE — Telephone Encounter (Signed)
Routing to Dr. Rinaldo Ratel, Nedra Hai, PA-C 3 minutes ago (10:08 AM)   Will route to [place] and Dr. [name] so they are aware of updated recommendation. Will defer their team to notify patient of when to begin holding once procedure scheduled. Will also cc update to [name], RN, at [facility] office who helps manage pt's INR. Please call with questions.      Note     Supple, Megan E, RPH-CPP routed conversation to Cv Div Preop 23 minutes ago (9:48 AM)   Supple, Megan E, RPH-CPP 23 minutes ago (9:47 AM)   Ok to hold warfarin for 1 day prior to procedure if it's low bleed risk. Would need to resume as soon as safely possible afterwards given elevated cardiac risk.      Note     Dunn, Nedra Hai, PA-C routed conversation to Cv Div Preop 31 minutes ago (9:39 AM)    Charlie Pitter, PA-C routed conversation to Cv Div Preop Pharm 31 minutes ago (9:39 AM)   Charlie Pitter, PA-C 31 minutes ago (9:39 AM)       Patient Name: Danielle [name]  DOB: 1945/12/20 MRN: [id number]   Primary Cardiologist: [name], MD   Chart revisited as part of pre-operative protocol coverage. [name], RN with Dr. Elza Rafter office, reached out this AM on behalf of Dr. [name] to clarify further Coumadin recommendations.    It was originally recommended to discontinue Coumadin 5 days prior with a Lovenox bridge. Per Dr. Elza Rafter message, the biopsy was cancelled due to these recommendations and she is wondering if it would be appropriate for discontinuation of Coumadin the day of procedure then restarting the day after if bleeding is controlled. Per message, "This is an office based procedure which usually does not result in significant bleeding. Patient will, however, likely need injections of local anesthetic in her cervix." Reschedule date is pending based on recommendations.   I will route back to pharmacy team for input.   Once final rec is reviewed, will need to call reply to [facility] @ [phone].    Charlie Pitter, PA-C 10/20/2020, 9:34 AM

## 2020-10-20 NOTE — Telephone Encounter (Signed)
Spoke with Melina Copa, PA, [facility].  Reviewed as seen below per Dr. [name].  Lisbeth Renshaw will forward to pharmacy to review and return call with recommendations.

## 2020-10-20 NOTE — Telephone Encounter (Signed)
   Patient Name: Danielle [name]  DOB: [birth year] MRN: [id number]  Primary Cardiologist: [name], MD  Chart revisited as part of pre-operative protocol coverage. [name], RN with Dr. Elza Rafter office, reached out this AM on behalf of Dr. [name] to clarify further Coumadin recommendations.   It was originally recommended to discontinue Coumadin 5 days prior with a Lovenox bridge. Per Dr. Elza Rafter message, the biopsy was cancelled due to these recommendations and she is wondering if it would be appropriate for discontinuation of Coumadin the day of procedure then restarting the day after if bleeding is controlled. Per message, "This is an office based procedure which usually does not result in significant bleeding. Patient will, however, likely need injections of local anesthetic in her cervix." Reschedule date is pending based on recommendations.  I will route back to pharmacy team for input.  Once final rec is reviewed, will need to call reply to [facility] @ [phone].  Charlie Pitter, PA-C 10/20/2020, 9:34 AM

## 2020-10-20 NOTE — Telephone Encounter (Signed)
Recommendations received for discontinuation of Coumadin 5 days prior to procedure and then initiating a Lovenox bridge through her PCP.   Biopsy was cancelled due to these recommendations.   Would it be appropriate for discontinuation of coumadin the day of procedure and then restarting it the day after if bleeding is controlled.   This is an office based procedure which usually does not result in significant bleeding.   Patient will, however, likely need injections of local anesthetic in her cervix.

## 2020-10-21 ENCOUNTER — Ambulatory Visit: Payer: Medicare Other

## 2020-10-21 ENCOUNTER — Telehealth (HOSPITAL_COMMUNITY): Payer: Self-pay | Admitting: *Deleted

## 2020-10-21 NOTE — Telephone Encounter (Signed)
CMRI no pre cert reqd.   Message sent to Garth Bigness to schedule.

## 2020-10-21 NOTE — Progress Notes (Signed)
GYNECOLOGY  VISIT   HPI: 75 y.o.   Married  [place] American  female   [id number] with No LMP recorded. Patient is postmenopausal.   here for endometrial biopsy for postmenopausal bleeding and thickened endometrium while taking Tamoxifen.   Patient did not take Coumadin last night.  Husband present for discussion of care.   GYNECOLOGIC HISTORY: No LMP recorded. Patient is postmenopausal. Contraception: PMP Menopausal hormone therapy:  none Last mammogram: 01-16-20  Diag.Bil./status post Lt.lumpectomy 2018/Neg/BiRads2 Last pap smear:  08-20-20 Neg:Neg HR HPV. Years ago normal always        OB History     Gravida  0   Para  0   Term  0   Preterm  0   AB  0   Living  0      SAB  0   IAB  0   Ectopic  0   Multiple  0   Live Births  0              Patient Active Problem List   Diagnosis Date Noted   Gait abnormality 07/31/2020   Paresthesia 07/31/2020   Low back pain with bilateral sciatica 07/31/2020   Pneumonia due to COVID-19 virus    Bacteremia due to Gram-positive bacteria 08/29/2018   Abscess    Cellulitis of left breast 08/28/2018   Cellulitis of left abdominal wall 08/28/2018   Encounter for routine gynecological examination 04/28/2018   CKD (chronic kidney disease), stage III ([place]) 09/27/2017   Recurrent genital herpes 04/13/2017   Chronic pain disorder 01/25/2017   Malignant neoplasm of upper-outer quadrant of left breast in female, estrogen receptor positive ([place]) 12/16/2016   Chronic knee pain 12/07/2016   Chronic bilateral low back pain without sciatica 12/07/2016   Peripheral musculoskeletal gait disorder 12/07/2016   Encounter for therapeutic drug monitoring 06/16/2016   Generalized osteoarthritis of multiple sites 05/04/2016   Chronic anticoagulation 05/04/2016   Stroke ([place]) 04/14/2016   Aortic valve disease 04/10/2016   S/P AVR 04/10/2016   NICM (nonischemic cardiomyopathy) ([place]) 04/10/2016   Upper airway cough syndrome 03/14/2016    Morbid obesity ([place]) 03/14/2016   COPD (chronic obstructive pulmonary disease) ([place]) 03/11/2016   Thrombocytopenia ([place]) 12/12/2015   Anemia 12/12/2015   Vitamin D deficiency 12/12/2015   Acute urinary retention 12/11/2015   Osteoporosis 12/11/2015   Hip fracture ([place]) 12/10/2015   Aortic atherosclerosis ([place]) 12/10/2015   Lung blebs ([place]) 12/03/2015   Pelvic fracture ([place]) 08/19/2015   Fall at home 08/19/2015   Essential hypertension 08/19/2015   Mixed hyperlipidemia 08/19/2015   Asthma 08/19/2015   Paroxysmal atrial fibrillation ([place]) 08/19/2015   Hyperthyroidism 08/19/2015   Chronic HFrEF (heart failure with reduced ejection fraction) ([place])    Coronary artery disease    Aortic stenosis     Past Medical History:  Diagnosis Date   Allergy    Anxiety    Aortic stenosis    Status post bioprosthetic AVR   Arthritis    DJD   Asthma    Breast cancer ([place]) 12/13/2016   Left breast   Chronic systolic CHF (congestive heart failure) ([place])    EF 30% 04/2014   COPD (chronic obstructive pulmonary disease) ([place])    Coronary artery disease    per pt, had LHC prior to AVR in [place] 2016 that did not show any blockages; no stents/bypass   Gastritis    Hyperlipidemia    Hypertension    Hyperthyroidism    Multiple thyroid nodules  Osteoporosis    Paroxysmal atrial fibrillation ([place])    Pelvis fracture ([place]) 08/19/2015   MULTIPLE    Personal history of radiation therapy 2018   Spontaneous pneumothorax 11/28/2015   left    Stroke [facility]) 2000   rt hand weak    Past Surgical History:  Procedure Laterality Date   BREAST LUMPECTOMY Left 12/2016   BREAST LUMPECTOMY WITH RADIOACTIVE SEED AND SENTINEL LYMPH NODE BIOPSY Left 01/14/2017   Procedure: LEFT BREAST LUMPECTOMY WITH RADIOACTIVE SEED AND SENTINEL LYMPH NODE BIOPSY;  Surgeon: Rolm Bookbinder, MD;  Location: [place];  Service: General;  Laterality: Left;   CARDIAC CATHETERIZATION     CHEST TUBE INSERTION  10/2015    INTRAMEDULLARY (IM) NAIL INTERTROCHANTERIC Left 12/10/2015   Procedure: INTRAMEDULLARY (IM) NAIL INTERTROCHANTRIC;  Surgeon: Leandrew Koyanagi, MD;  Location: [place];  Service: Orthopedics;  Laterality: Left;   PLEURADESIS Left 12/03/2015   Procedure: PLEURADESIS;  Surgeon: Melrose Nakayama, MD;  Location: [place];  Service: Thoracic;  Laterality: Left;   RESECTION OF APICAL BLEB Left 12/03/2015   Procedure: BLEBECTOMY;  Surgeon: Melrose Nakayama, MD;  Location: [place];  Service: Thoracic;  Laterality: Left;   RIGHT/LEFT HEART CATH AND CORONARY ANGIOGRAPHY N/A 03/10/2020   Procedure: RIGHT/LEFT HEART CATH AND CORONARY ANGIOGRAPHY;  Surgeon: [name], MD;  Location: [place] [facility];  Service: Cardiovascular;  Laterality: N/A;   THORACOSCOPY  12/03/2015   VALVE REPLACEMENT     VIDEO ASSISTED THORACOSCOPY Left 12/03/2015   Procedure: VIDEO ASSISTED THORACOSCOPY;  Surgeon: Melrose Nakayama, MD;  Location[facility];  Service: Thoracic;  Laterality: Left;    Current Outpatient Medications  Medication Sig Dispense Refill   albuterol (PROVENTIL HFA;VENTOLIN HFA) 108 (90 Base) MCG/ACT inhaler Inhale 2 puffs into the lungs every 6 (six) hours as needed for wheezing or shortness of breath.      alendronate (FOSAMAX) 70 MG tablet Take 1 tablet (70 mg total) by mouth once a week. Take with a full glass of water on an empty stomach. 13 tablet 3   ascorbic acid (VITAMIN C) 500 MG tablet Take 500 mg by mouth daily.     budesonide-formoterol (SYMBICORT) 160-4.5 MCG/ACT inhaler Inhale 2 puffs into the lungs daily as needed.     carvedilol (COREG) 12.5 MG tablet TAKE 1 TABLET BY MOUTH  TWICE DAILY WITH A MEAL 180 tablet 0   Cholecalciferol 25 MCG (1000 UT) tablet Take 1,000 Units by mouth daily.     dapagliflozin propanediol (FARXIGA) 10 MG TABS tablet Take 1 tablet (10 mg total) by mouth daily before breakfast. 30 tablet 3   diphenhydramine-acetaminophen (TYLENOL PM) 25-500 MG TABS tablet Take 2 tablets  by mouth at bedtime.     fluticasone (FLONASE) 50 MCG/ACT nasal spray Place 1 spray into both nostrils daily. 16 g 3   methimazole (TAPAZOLE) 5 MG tablet Take 3 tablets (15 mg total) by mouth daily. 270 tablet 2   pantoprazole (PROTONIX) 40 MG tablet Take 1 tablet (40 mg total) by mouth daily. 30 tablet 11   rosuvastatin (CRESTOR) 40 MG tablet TAKE 1 TABLET BY MOUTH  DAILY 90 tablet 3   sacubitril-valsartan (ENTRESTO) 49-51 MG Take 1 tablet by mouth 2 (two) times daily.     senna-docusate (SENOKOT-S) 8.6-50 MG tablet Take 2 tablets at bedtime by mouth.      spironolactone (ALDACTONE) 25 MG tablet Take 0.5 tablets (12.5 mg total) by mouth daily. 45 tablet 3   tamoxifen (NOLVADEX) 20 MG  tablet Take 1 tablet (20 mg total) by mouth daily. 90 tablet 3   torsemide (DEMADEX) 20 MG tablet Take 1 tablet (20 mg total) by mouth daily. 90 tablet 3   traMADol (ULTRAM) 50 MG tablet Take 1 tablet (50 mg total) by mouth 3 (three) times daily. 90 tablet 5   trolamine salicylate (ASPERCREME) 10 % cream Apply 1 application topically as needed for muscle pain.     valACYclovir (VALTREX) 500 MG tablet Take 500 mg by mouth 2 (two) times daily as needed.     warfarin (COUMADIN) 3 MG tablet Take 1 tablet daily or Take as directed by anticoagulation clinic 90 tablet 1   No current facility-administered medications for this visit.     ALLERGIES: Lisinopril, Tetanus toxoid adsorbed, and Other  Family History  Problem Relation Age of Onset   Diabetes Mother    Heart attack Mother 15   Diabetes Father    Lung cancer Father    Diabetes Sister    Thyroid disease Sister    Diabetes Sister    HIV Brother     Social History   Socioeconomic History   Marital status: Married    Spouse name: [name]   Number of children: 0   Years of education: 12   Highest education level: 12th grade  Occupational History   Occupation: Retired in 2004  Tobacco Use   Smoking status: Former    Packs/day: 0.25    Years: 10.00     Pack years: 2.50    Types: Cigarettes    Quit date: 03/01/2010    Years since quitting: 10.6   Smokeless tobacco: Never  Vaping Use   Vaping Use: Never used  Substance and Sexual Activity   Alcohol use: No   Drug use: No   Sexual activity: Yes    Birth control/protection: Post-menopausal  Other Topics Concern   Not on file  Social History Narrative   Lives with husband.  Ambulated independently.   Right-handed.   No daily caffeine use.   Social Determinants of Health   Financial Resource Strain: Medium Risk   Difficulty of Paying Living Expenses: Somewhat hard  Food Insecurity: No Food Insecurity   Worried About Charity fundraiser in the Last Year: Never true   Ran Out of Food in the Last Year: Never true  Transportation Needs: No Transportation Needs   Lack of Transportation (Medical): No   Lack of Transportation (Non-Medical): No  Physical Activity: Sufficiently Active   Days of Exercise per Week: 7 days   Minutes of Exercise per Session: 30 min  Stress: No Stress Concern Present   Feeling of Stress : Only a little  Social Connections: Moderately Integrated   Frequency of Communication with Friends and Family: More than three times a week   Frequency of Social Gatherings with Friends and Family: More than three times a week   Attends Religious Services: More than 4 times per year   Active Member of [employer] or Organizations: No   Attends Archivist Meetings: Never   Marital Status: Married  Human resources officer Violence: Not At Risk   Fear of Current or Ex-Partner: No   Emotionally Abused: No   Physically Abused: No   Sexually Abused: No    Review of Systems  All other systems reviewed and are negative.  PHYSICAL EXAMINATION:    BP 114/68   Pulse 80   Ht '5\' 7"'$  (1.702 m)   Wt 257 lb (116.6 kg)  SpO2 98%   BMI 40.25 kg/m     General appearance: alert, cooperative and appears stated age  Endometrial biopsy Consent for procedure.  Sterile prep with  Hibiclens.  Paracervical block with 10 cc 1% lidocaine, lot [id number], exp 05/2022. Tenaculum to anterior cervical lip and then posterior cervical lip.  Tissue fragile and friable.  Os finder used.  Attempt to dilate cervix.  Pipelle passed to 4.5 cm.  Minimal amount of tissue to pathology.  EBL 10 cc.  Good hemostasis at the end of the procedure.  No complications.   Chaperone was present for exam: Estill Bamberg, CMA.  ASSESSMENT  Postmenopausal bleeding and thickened endometrium on Tamoxifen.  On chronic anticoagulation.  Thrombocytopenia. COPD.  CHF.   PLAN  Fu biopsy result.  I suspect that this is not adequate and that patient will need to proceed wit Hysteroscopy and dilation and curettage in surgical setting with ultrasound guidance. Risks of surgery include but are not limited to bleeding, infection, damage to surrounding organs including uterine perforation requiring hospitalization and laparoscopy, pneumonia, reaction to anesthesia, DVT, PE, death, need for further treatment and surgery.  I recommend no coumadin tonight and restart this tomorrow.  She will contact her PCP office for further instructions regarding her anticoagulation.   Will contact her oncology and recommend discontinuation of Tamoxifen.  Will need to coordinate with cardiology regarding discontinuation of coumadin and Lovenox bridge prior to surgical care.   Will also need have hematology assess her platelets in preparation for outpatient surgery.    An After Visit Summary was printed and given to the patient.  25 min total time was spent for this patient encounter, including preparation, face-to-face counseling with the patient, coordination of care, and documentation of the encounter.

## 2020-10-21 NOTE — Telephone Encounter (Signed)
Villa Herb, RN, is no longer with clinic. This nurse is taking over Cindy's coumadin pts.  Contacted pt and she reports she was advised to take 1/2 dose of coumadin last night and hold dose today. She will have procedure tomorrow and Dr. [name] will let her know when to restart coumadin. Advised if she hold coumadin more than 2 days to contact this office and her apt scheduled for 9/12 will be RS to a sooner date. Pt verbalized understanding.

## 2020-10-22 ENCOUNTER — Other Ambulatory Visit (HOSPITAL_COMMUNITY)
Admission: RE | Admit: 2020-10-22 | Discharge: 2020-10-22 | Disposition: A | Discharge status: Home / Self Care | Payer: Medicare Other | Source: Ambulatory Visit | Attending: Obstetrics and Gynecology | Admitting: Obstetrics and Gynecology

## 2020-10-22 ENCOUNTER — Ambulatory Visit: Payer: Medicare Other | Admitting: Obstetrics and Gynecology

## 2020-10-22 ENCOUNTER — Telehealth: Payer: Self-pay | Admitting: Internal Medicine

## 2020-10-22 ENCOUNTER — Encounter: Payer: Self-pay | Admitting: Obstetrics and Gynecology

## 2020-10-22 ENCOUNTER — Other Ambulatory Visit: Payer: Self-pay

## 2020-10-22 VITALS — BP 114/68 | HR 80 | Ht 67.0 in | Wt 257.0 lb

## 2020-10-22 DIAGNOSIS — Z79810 Long term (current) use of selective estrogen receptor modulators (SERMs): Secondary | ICD-10-CM | POA: Insufficient documentation

## 2020-10-22 DIAGNOSIS — N882 Stricture and stenosis of cervix uteri: Secondary | ICD-10-CM

## 2020-10-22 DIAGNOSIS — N950 Postmenopausal bleeding: Secondary | ICD-10-CM | POA: Insufficient documentation

## 2020-10-22 DIAGNOSIS — R9389 Abnormal findings on diagnostic imaging of other specified body structures: Secondary | ICD-10-CM | POA: Diagnosis not present

## 2020-10-22 NOTE — Patient Instructions (Signed)
Please call if you have heavy bleeding with changing a saturated pad every hour, if you develop significant pain, or if you develop fever.

## 2020-10-22 NOTE — Telephone Encounter (Signed)
Pt called to report they tried to do the procedure today but were not able to get the biopsy so she will need to have surgery scheduled for this. She reports the procedure did cause a very small amount of bleeding but it is just a very small amount. Advised pt to restart coumadin, take '3mg'$  today and then resume normal dosing. Advised to watch for increased bleeding and if bleeding increased go to UC for assessment or to the ER. Advised to let this nurse know when her surgery will be scheduled. Advised keep her upcoming coumadin clinic apt. Pt verbalized understanding

## 2020-10-23 ENCOUNTER — Telehealth: Payer: Self-pay | Admitting: Obstetrics and Gynecology

## 2020-10-23 NOTE — Telephone Encounter (Signed)
Please reach out to Dr. Burr Medico of hematology/oncology regarding care of patient.   The patient is currently on Tamoxifen, and has postmenopausal bleeding and thickened endometrium. I attempted a biopsy in the office and suspect the biopsy is not going to be adequate for a diagnosis.  I am anticipating hysteroscopy with dilation and curettage.   Her hematologist/oncologist may want determine if she needs to stop the Tamoxifen.   I will also need guidance from hematology/oncology regarding her low platelets and anticipated surgery.   I am working with her cardiology team regarding perioperative anticoagulation.

## 2020-10-24 ENCOUNTER — Ambulatory Visit (INDEPENDENT_AMBULATORY_CARE_PROVIDER_SITE_OTHER): Payer: Medicare Other

## 2020-10-24 DIAGNOSIS — E782 Mixed hyperlipidemia: Secondary | ICD-10-CM

## 2020-10-24 DIAGNOSIS — I5022 Chronic systolic (congestive) heart failure: Secondary | ICD-10-CM

## 2020-10-24 DIAGNOSIS — I10 Essential (primary) hypertension: Secondary | ICD-10-CM | POA: Diagnosis not present

## 2020-10-24 DIAGNOSIS — I480 Paroxysmal atrial fibrillation: Secondary | ICD-10-CM | POA: Diagnosis not present

## 2020-10-24 DIAGNOSIS — M25562 Pain in left knee: Secondary | ICD-10-CM

## 2020-10-24 LAB — SURGICAL PATHOLOGY

## 2020-10-24 NOTE — Chronic Care Management (AMB) (Signed)
Chronic Care Management   CCM RN Visit Note  10/24/2020 Name: Danielle [name] MRN: [id number] DOB: 05-02-1945  Subjective: Danielle [name] is a 75 y.o. year old female who is a primary care patient of [name], [name], MD. The care management team was consulted for assistance with disease management and care coordination needs.    Engaged with patient by telephone for follow up visit in response to provider referral for case management and/or care coordination services.   Consent to Services:  The patient was given information about Chronic Care Management services, agreed to services, and gave verbal consent prior to initiation of services.  Please see initial visit note for detailed documentation.   Patient agreed to services and verbal consent obtained.   Assessment: Review of patient past medical history, allergies, medications, health status, including review of consultants reports, laboratory and other test data, was performed as part of comprehensive evaluation and provision of chronic care management services.   SDOH (Social Determinants of Health) assessments and interventions performed:    CCM Care Plan  Allergies  Allergen Reactions   Lisinopril Cough   Tetanus Toxoid Adsorbed Swelling    Arm swelling   Other Cough    PEANUTS    Outpatient Encounter Medications as of 10/24/2020  Medication Sig Note   albuterol (PROVENTIL HFA;VENTOLIN HFA) 108 (90 Base) MCG/ACT inhaler Inhale 2 puffs into the lungs every 6 (six) hours as needed for wheezing or shortness of breath.     alendronate (FOSAMAX) 70 MG tablet Take 1 tablet (70 mg total) by mouth once a week. Take with a full glass of water on an empty stomach.    ascorbic acid (VITAMIN C) 500 MG tablet Take 500 mg by mouth daily.    budesonide-formoterol (SYMBICORT) 160-4.5 MCG/ACT inhaler Inhale 2 puffs into the lungs daily as needed.    carvedilol (COREG) 12.5 MG tablet TAKE 1 TABLET BY MOUTH  TWICE  DAILY WITH A MEAL    Cholecalciferol 25 MCG (1000 UT) tablet Take 1,000 Units by mouth daily.    dapagliflozin propanediol (FARXIGA) 10 MG TABS tablet Take 1 tablet (10 mg total) by mouth daily before breakfast.    diphenhydramine-acetaminophen (TYLENOL PM) 25-500 MG TABS tablet Take 2 tablets by mouth at bedtime.    fluticasone (FLONASE) 50 MCG/ACT nasal spray Place 1 spray into both nostrils daily.    methimazole (TAPAZOLE) 5 MG tablet Take 3 tablets (15 mg total) by mouth daily.    pantoprazole (PROTONIX) 40 MG tablet Take 1 tablet (40 mg total) by mouth daily.    rosuvastatin (CRESTOR) 40 MG tablet TAKE 1 TABLET BY MOUTH  DAILY    sacubitril-valsartan (ENTRESTO) 49-51 MG Take 1 tablet by mouth 2 (two) times daily.    senna-docusate (SENOKOT-S) 8.6-50 MG tablet Take 2 tablets at bedtime by mouth.     spironolactone (ALDACTONE) 25 MG tablet Take 0.5 tablets (12.5 mg total) by mouth daily.    tamoxifen (NOLVADEX) 20 MG tablet Take 1 tablet (20 mg total) by mouth daily.    torsemide (DEMADEX) 20 MG tablet Take 1 tablet (20 mg total) by mouth daily.    traMADol (ULTRAM) 50 MG tablet Take 1 tablet (50 mg total) by mouth 3 (three) times daily. 09/16/2020: Fill date 08/27/20 #90 today #55 last dose today   trolamine salicylate (ASPERCREME) 10 % cream Apply 1 application topically as needed for muscle pain.    valACYclovir (VALTREX) 500 MG tablet Take 500 mg by mouth 2 (two) times daily as  needed.    warfarin (COUMADIN) 3 MG tablet Take 1 tablet daily or Take as directed by anticoagulation clinic    No facility-administered encounter medications on file as of 10/24/2020.    Patient Active Problem List   Diagnosis Date Noted   Gait abnormality 07/31/2020   Paresthesia 07/31/2020   Low back pain with bilateral sciatica 07/31/2020   Pneumonia due to COVID-19 virus    Bacteremia due to Gram-positive bacteria 08/29/2018   Abscess    Cellulitis of left breast 08/28/2018   Cellulitis of left abdominal  wall 08/28/2018   Encounter for routine gynecological examination 04/28/2018   CKD (chronic kidney disease), stage III ([place]) 09/27/2017   Recurrent genital herpes 04/13/2017   Chronic pain disorder 01/25/2017   Malignant neoplasm of upper-outer quadrant of left breast in female, estrogen receptor positive ([place]) 12/16/2016   Chronic knee pain 12/07/2016   Chronic bilateral low back pain without sciatica 12/07/2016   Peripheral musculoskeletal gait disorder 12/07/2016   Encounter for therapeutic drug monitoring 06/16/2016   Generalized osteoarthritis of multiple sites 05/04/2016   Chronic anticoagulation 05/04/2016   Stroke ([place]) 04/14/2016   Aortic valve disease 04/10/2016   S/P AVR 04/10/2016   NICM (nonischemic cardiomyopathy) ([place]) 04/10/2016   Upper airway cough syndrome 03/14/2016   Morbid obesity ([place]) 03/14/2016   COPD (chronic obstructive pulmonary disease) ([place]) 03/11/2016   Thrombocytopenia ([place]) 12/12/2015   Anemia 12/12/2015   Vitamin D deficiency 12/12/2015   Acute urinary retention 12/11/2015   Osteoporosis 12/11/2015   Hip fracture ([place]) 12/10/2015   Aortic atherosclerosis ([place]) 12/10/2015   Lung blebs ([place]) 12/03/2015   Pelvic fracture ([place]) 08/19/2015   Fall at home 08/19/2015   Essential hypertension 08/19/2015   Mixed hyperlipidemia 08/19/2015   Asthma 08/19/2015   Paroxysmal atrial fibrillation ([place]) 08/19/2015   Hyperthyroidism 08/19/2015   Chronic HFrEF (heart failure with reduced ejection fraction) ([facility])    Coronary artery disease    Aortic stenosis     Conditions to be addressed/monitored:Atrial Fibrillation, CHF, HTN, HLD, and chronic pain  Care Plan : RNCM: Cardiovascular Disease Management( Heart Failure, HTN, paroxysmal atrial fibrillation)  Updates made by Dimitri Ped, RN since 10/24/2020 12:00 AM     Problem: Lack of long term management of Cardiovascular Disease( Heart Failure, HTN, paroxysmal atrial fibrillation)   Priority: Medium      Long-Range Goal: Long term self mangement of Cardiovascular Disease Management( Heart Failure, HTN, paroxysmal atrial fibrillation)   Start Date: 06/24/2020  Expected End Date: 12/29/2020  This Visit's Progress: On track  Recent Progress: On track  Priority: Medium  Note:   Current Barriers:  Knowledge deficits related to basic heart failure pathophysiology and self care management of Cardiovascular Disease Management( Heart Failure, HTN, paroxysmal atrial fibrillation)  Does not adhere to provider recommendations re: low sodium diet at times  States that the torsemide is working better to help her keep her fluid off and she is taking the [place] as ordered.  Pt reports weighting daily and her weight was 242 today denies any chest pains, reports her swelling in legs has improved.  Reports she wears compression hose but not always, reports shortness of breath with exertion,  denies any recent atrial fibrillation, reports she has her blood checked regularly for her Coumadin, States she is checking her B/P 4-5 times a week. States she tries to follow a low sodium diet and the fluid restriction that the heart failure clinic gave her.  States she has been having some  vaginal bleeding that she will need to have surgery to do the biopsy.  States the surgery has not been scheduled yet Nurse Case Manager Clinical Goal(s):  patient will weigh self daily and record patient will verbalize understanding of Heart Failure Action Plan and when to call doctor patient will take all Heart Failure mediations as prescribed Interventions:  Collaboration with [name], [name], MD regarding development and update of comprehensive plan of care as evidenced by provider attestation and co-signature Inter-disciplinary care team collaboration (see longitudinal plan of care) Reveiwed basic overview and discussion of pathophysiology of Heart Failure Reinforced to follow a  low sodium diet and fluid restrictions Reviewed  Heart Failure Action Plan in depth  Assessed for scales in home-has scale Reinforced importance of daily weight Reinforced role of diuretics in prevention of fluid overload Reviewed upcoming provider appointments Heart failure clinic 12/17/20, Dr. Saunders Revel cardiology 11/12/20/22, oncology 11/21/20 Self-Care Activities:  Takes Heart Failure Medications as prescribed Weighs daily and record (notifying MD of 3 lb weight gain over night or 5 lb in a week)  Patient Goals:  - Take Heart Failure Medications as prescribed - Weigh daily and record (notify MD with 3 lb weight gain over night or 5 lb in a week) - Follow CHF Action Plan - Adhere to low sodium diet - begin a heart failure diary - bring diary to all appointments - develop a rescue plan - eat more whole grains, fruits and vegetables, lean meats and healthy fats - follow rescue plan if symptoms flare-up - know when to call the doctor - follow activity or exercise plan - make an activity or exercise plan - meet with physical therapist - pace activity allowing for rest - do ankle pumps when sitting - keep legs up while sitting - track weight in diary - use salt in moderation - watch for swelling in feet, ankles and legs every day -Calls provider office for new concerns, questions, or BP outside discussed parameters -Follows a low sodium diet/DASH diet -check blood pressure 3 times per week - write blood pressure and weight results in a log or diary  -Plan to fill your medication box weekly  Follow Up Plan: Telephone follow up appointment with care management team member scheduled for:  12/02/20 at 3:30 PM The patient has been provided with contact information for the care management team and has been advised to call with any health related questions or concerns.      Care Plan : RNCM:Chronic Pain (Adult)  Updates made by Dimitri Ped, RN since 10/24/2020 12:00 AM     Problem: Chronic Pain Management (Chronic Pain)   Priority:  Medium     Long-Range Goal: Chronic Pain Managed   Start Date: 06/24/2020  Expected End Date: 12/29/2020  This Visit's Progress: On track  Recent Progress: On track  Priority: Medium  Note:   Current Barriers:  Knowledge Deficits related to self-health management of acute or chronic pain of lower back due to degenerative disc disease  Chronic Disease Management support and education needs related to chronic pain Chronic Disease Management support and education needs related to chronic back pain Does not adhere to provider recommendations re: exercise Unable to perform IADLs independently uses walker  Pt states she has completed PT to help with her back pain, States she is doing her exercises that they taught her.  States she would like to get strong enough to only need a cane and be able to help her husband around the house  more. states she is taking her Tramadol as ordered.  Deneis any falls.  States she uses her walker in her home but uses a wheelchair when she goes to appointments Clinical Goal(s):  patient will verbalize understanding of plan for pain management. , patient will attend all scheduled medical appointments:  Dr. Read Drivers 11/27/20, patient will demonstrate use of different relaxation  skills and/or diversional activities to assist with pain reduction (distraction, imagery, relaxation, massage, acupressure, TENS, heat, and cold application., patient will report pain at a level less than 3 to 4 on a 10-10 rating scale., patient will use pharmacological and nonpharmacological pain relief strategies as prescribed. , patient will verbalize acceptable level of pain relief and ability to engage in desired activities, and patient will engage in desired activities without an increase in pain level Interventions:  Collaboration with [name], [name], MD regarding development and update of comprehensive plan of care as evidenced by provider attestation and co-signature Pain assessment  performed Medications reviewed Reinforced on methods of chronic pain control  Discussed plans with patient for ongoing care management follow up and provided patient with direct contact information for care management team Reviewed scheduled/upcoming provider appointments including:  Dr. Read Drivers 11/27/20 Social Work working with pt for grief counseling after death of sister-completed grief counseling Reinforced benefits of exercise and therapy to help with chronic pain Patient Goals/Self Care Activities:  Will self-administer medications as prescribed Will attend all scheduled provider appointments Will call pharmacy for medication refills 7 days prior to needed refill date Patient will calls provider office for new concerns or questions  learn how to meditate learn relaxation techniques practice acceptance of chronic pain  practice relaxation or meditation daily  tell myself I can (not I can't) think of new ways to do favorite things  use distraction techniques  use relaxation during pain Follow Up Plan: Telephone follow up appointment with care management team member scheduled for: 12/02/20 at 3:30 PM The patient has been provided with contact information for the care management team and has been advised to call with any health related questions or concerns.        Plan:Telephone follow up appointment with care management team member scheduled for:  12/02/20 and The patient has been provided with contact information for the care management team and has been advised to call with any health related questions or concerns.  Peter Garter RN, Jackquline [place], CDE Care Management Coordinator [facility] [phone], Mobile [phone]

## 2020-10-24 NOTE — Patient Instructions (Signed)
Visit Information  PATIENT GOALS:  Goals Addressed             This Visit's Progress    RNCM-Track and Manage Symptoms of cardiovascular disease(CHF, HTN,atrial fibrillation)   On track    Timeframe:  Long-Range Goal Priority:  Medium Start Date:     06/24/20                        Expected End Date:     12/29/20 Follow up date: 12/02/20                         - begin a heart failure diary - develop a rescue plan - eat more whole grains, fruits and vegetables, lean meats and healthy fats - follow rescue plan if symptoms flare-up - know when to call the doctor - track symptoms and what helps feel better or worse  -Call provider office for new concerns, questions, or BP outside discussed parameters -Follow a low sodium diet/DASH diet -check blood pressure 3 times per week - write blood pressure and weight results in a log or diary  -Plan to fill your medication box weekly  Why is this important?   You will be able to handle your symptoms better if you keep track of them.  Making some simple changes to your lifestyle will help.  Eating healthy is one thing you can do to take good care of yourself.       RNCM:Cope with Chronic Pain   On track    Timeframe:  Long-Range Goal Priority:  Medium Start Date:      06/24/20                       Expected End Date:        12/29/20               Follow Up Date 12/02/20    - learn how to meditate - learn relaxation techniques - practice acceptance of chronic pain - practice relaxation or meditation daily - tell myself I can (not I can't) - think of new ways to do favorite things - use distraction techniques - use relaxation during pain    Why is this important?   Stress makes chronic pain feel worse.  Feelings like depression, anxiety, stress and anger can make your body more sensitive to pain.  Learning ways to cope with stress or depression may help you find some relief from the pain.     Notes:         Patient  verbalizes understanding of instructions provided today and agrees to view in [place].   Telephone follow up appointment with care management team member scheduled for: 12/02/20  Peter Garter RN, [facility], CDE Care Management Coordinator [place] [phone], Mobile [phone]

## 2020-10-27 ENCOUNTER — Ambulatory Visit (HOSPITAL_COMMUNITY)
Admission: RE | Admit: 2020-10-27 | Discharge: 2020-10-27 | Disposition: A | Discharge status: Home / Self Care | Payer: Medicare Other | Source: Ambulatory Visit | Attending: Internal Medicine | Admitting: Internal Medicine

## 2020-10-27 ENCOUNTER — Telehealth: Payer: Self-pay | Admitting: *Deleted

## 2020-10-27 ENCOUNTER — Other Ambulatory Visit (HOSPITAL_COMMUNITY): Payer: Self-pay | Admitting: Family Medicine

## 2020-10-27 ENCOUNTER — Other Ambulatory Visit: Payer: Self-pay

## 2020-10-27 DIAGNOSIS — I5022 Chronic systolic (congestive) heart failure: Secondary | ICD-10-CM | POA: Insufficient documentation

## 2020-10-27 DIAGNOSIS — N183 Chronic kidney disease, stage 3 unspecified: Secondary | ICD-10-CM

## 2020-10-27 LAB — BASIC METABOLIC PANEL
Anion gap: 6 (ref 5–15)
BUN: 16 mg/dL (ref 8–23)
CO2: 25 mmol/L (ref 22–32)
Calcium: 9.1 mg/dL (ref 8.9–10.3)
Chloride: 107 mmol/L (ref 98–111)
Creatinine, Ser: 1.59 mg/dL — ABNORMAL HIGH (ref 0.44–1.00)
GFR, Estimated: 34 mL/min — ABNORMAL LOW (ref >60)
Glucose, Bld: 113 mg/dL — ABNORMAL HIGH (ref 70–99)
Potassium: 4.7 mmol/L (ref 3.5–5.1)
Sodium: 138 mmol/L (ref 135–145)

## 2020-10-27 NOTE — Telephone Encounter (Signed)
Call received from [place] at Dr. Larey Brick office. Confirmed message received. Routing to Dr. Burr Medico to advise.

## 2020-10-27 NOTE — Telephone Encounter (Signed)
Call placed to [facility]/ Dr. Burr Medico.  Left detailed message on provider line, advised as seen below per Dr. [name]. Return call to [place], [place] at [phone].

## 2020-10-27 NOTE — Telephone Encounter (Signed)
Dr. Ernestina Penna office contacted by Danielle Manchester, RN with Dr. [name] of [facility]. Per [name], Dr.Silva's OV note in Epic chart: Patient experiencing post menopausal; bleeding and thickened endometrium.  Per [name], Dr. [name] is anticipating scheduling patient for Hysteroscopy and D&C. Endometrial biopsy was attempted in office, but may not have obtained enough tissue. Dr. [name] is requesting guidance from Dr. Burr Medico with planning surgery as patient takes Tamoxifen and platelets low on recent labs. Information routed/given to Dr.Feng.

## 2020-10-28 ENCOUNTER — Other Ambulatory Visit: Payer: Self-pay

## 2020-10-28 DIAGNOSIS — Z170 Estrogen receptor positive status [ER+]: Secondary | ICD-10-CM

## 2020-10-28 DIAGNOSIS — C50412 Malignant neoplasm of upper-outer quadrant of left female breast: Secondary | ICD-10-CM

## 2020-10-29 ENCOUNTER — Encounter: Payer: Self-pay | Admitting: Nurse Practitioner

## 2020-10-29 ENCOUNTER — Inpatient Hospital Stay: Payer: Medicare Other | Attending: Nurse Practitioner | Admitting: Nurse Practitioner

## 2020-10-29 ENCOUNTER — Other Ambulatory Visit: Payer: Self-pay

## 2020-10-29 ENCOUNTER — Inpatient Hospital Stay: Payer: Medicare Other

## 2020-10-29 VITALS — BP 130/75 | HR 80 | Temp 96.7°F | Resp 18 | Wt 251.1 lb

## 2020-10-29 DIAGNOSIS — J449 Chronic obstructive pulmonary disease, unspecified: Secondary | ICD-10-CM | POA: Insufficient documentation

## 2020-10-29 DIAGNOSIS — I110 Hypertensive heart disease with heart failure: Secondary | ICD-10-CM | POA: Diagnosis not present

## 2020-10-29 DIAGNOSIS — Z7951 Long term (current) use of inhaled steroids: Secondary | ICD-10-CM | POA: Insufficient documentation

## 2020-10-29 DIAGNOSIS — C50412 Malignant neoplasm of upper-outer quadrant of left female breast: Secondary | ICD-10-CM | POA: Diagnosis not present

## 2020-10-29 DIAGNOSIS — I350 Nonrheumatic aortic (valve) stenosis: Secondary | ICD-10-CM | POA: Diagnosis not present

## 2020-10-29 DIAGNOSIS — Z170 Estrogen receptor positive status [ER+]: Secondary | ICD-10-CM

## 2020-10-29 DIAGNOSIS — M810 Age-related osteoporosis without current pathological fracture: Secondary | ICD-10-CM | POA: Insufficient documentation

## 2020-10-29 DIAGNOSIS — Z7901 Long term (current) use of anticoagulants: Secondary | ICD-10-CM | POA: Insufficient documentation

## 2020-10-29 DIAGNOSIS — Z923 Personal history of irradiation: Secondary | ICD-10-CM | POA: Insufficient documentation

## 2020-10-29 DIAGNOSIS — Z8719 Personal history of other diseases of the digestive system: Secondary | ICD-10-CM | POA: Diagnosis not present

## 2020-10-29 DIAGNOSIS — Z79810 Long term (current) use of selective estrogen receptor modulators (SERMs): Secondary | ICD-10-CM | POA: Insufficient documentation

## 2020-10-29 DIAGNOSIS — Z7984 Long term (current) use of oral hypoglycemic drugs: Secondary | ICD-10-CM | POA: Insufficient documentation

## 2020-10-29 DIAGNOSIS — I480 Paroxysmal atrial fibrillation: Secondary | ICD-10-CM | POA: Insufficient documentation

## 2020-10-29 DIAGNOSIS — R232 Flushing: Secondary | ICD-10-CM | POA: Diagnosis not present

## 2020-10-29 DIAGNOSIS — N950 Postmenopausal bleeding: Secondary | ICD-10-CM | POA: Insufficient documentation

## 2020-10-29 DIAGNOSIS — E785 Hyperlipidemia, unspecified: Secondary | ICD-10-CM | POA: Diagnosis not present

## 2020-10-29 DIAGNOSIS — D696 Thrombocytopenia, unspecified: Secondary | ICD-10-CM | POA: Insufficient documentation

## 2020-10-29 DIAGNOSIS — Z8673 Personal history of transient ischemic attack (TIA), and cerebral infarction without residual deficits: Secondary | ICD-10-CM | POA: Diagnosis not present

## 2020-10-29 DIAGNOSIS — I5022 Chronic systolic (congestive) heart failure: Secondary | ICD-10-CM | POA: Diagnosis not present

## 2020-10-29 DIAGNOSIS — I2510 Atherosclerotic heart disease of native coronary artery without angina pectoris: Secondary | ICD-10-CM | POA: Insufficient documentation

## 2020-10-29 DIAGNOSIS — Z79899 Other long term (current) drug therapy: Secondary | ICD-10-CM | POA: Diagnosis not present

## 2020-10-29 DIAGNOSIS — N183 Chronic kidney disease, stage 3 unspecified: Secondary | ICD-10-CM

## 2020-10-29 LAB — CBC WITH DIFFERENTIAL (CANCER CENTER ONLY)
Abs Immature Granulocytes: 0.01 10*3/uL (ref 0.00–0.07)
Basophils Absolute: 0 10*3/uL (ref 0.0–0.1)
Basophils Relative: 1 %
Eosinophils Absolute: 0.4 10*3/uL (ref 0.0–0.5)
Eosinophils Relative: 7 %
HCT: 37.5 % (ref 36.0–46.0)
Hemoglobin: 12.1 g/dL (ref 12.0–15.0)
Immature Granulocytes: 0 %
Lymphocytes Relative: 32 %
Lymphs Abs: 1.6 10*3/uL (ref 0.7–4.0)
MCH: 31.1 pg (ref 26.0–34.0)
MCHC: 32.3 g/dL (ref 30.0–36.0)
MCV: 96.4 fL (ref 80.0–100.0)
Monocytes Absolute: 0.5 10*3/uL (ref 0.1–1.0)
Monocytes Relative: 11 %
Neutro Abs: 2.4 10*3/uL (ref 1.7–7.7)
Neutrophils Relative %: 49 %
Platelet Count: 91 10*3/uL — ABNORMAL LOW (ref 150–400)
RBC: 3.89 MIL/uL (ref 3.87–5.11)
RDW: 14.5 % (ref 11.5–15.5)
WBC Count: 4.9 10*3/uL (ref 4.0–10.5)
nRBC: 0 % (ref 0.0–0.2)

## 2020-10-29 LAB — CMP (CANCER CENTER ONLY)
ALT: 11 U/L (ref 0–44)
AST: 13 U/L — ABNORMAL LOW (ref 15–41)
Albumin: 3.8 g/dL (ref 3.5–5.0)
Alkaline Phosphatase: 50 U/L (ref 38–126)
Anion gap: 8 (ref 5–15)
BUN: 16 mg/dL (ref 8–23)
CO2: 25 mmol/L (ref 22–32)
Calcium: 9.8 mg/dL (ref 8.9–10.3)
Chloride: 108 mmol/L (ref 98–111)
Creatinine: 1.64 mg/dL — ABNORMAL HIGH (ref 0.44–1.00)
GFR, Estimated: 33 mL/min — ABNORMAL LOW (ref >60)
Glucose, Bld: 91 mg/dL (ref 70–99)
Potassium: 4.8 mmol/L (ref 3.5–5.1)
Sodium: 141 mmol/L (ref 135–145)
Total Bilirubin: 0.4 mg/dL (ref 0.3–1.2)
Total Protein: 7.3 g/dL (ref 6.5–8.1)

## 2020-10-29 NOTE — Progress Notes (Signed)
[facility]   [phone] [phone]   Clinic Follow up Note   Patient Care Team: [name], [name], MD as PCP - General (Family Medicine) End, Christopher, MD as PCP - Cardiology (Cardiology) Feng, Yan, MD as Consulting Physician (Hematology) Moody, John, MD as Consulting Physician (Radiation Oncology) Wakefield, Matthew, MD as Consulting Physician (General Surgery) [name], Lindsey Cornetto, NP as Nurse Practitioner (Hematology and Oncology) Sandlin, Melissa J, RN as Case Manager Pryor, Madeline G, RPH as Pharmacist (Pharmacist) Burton, Lacie K, NP as Nurse Practitioner (Nurse Practitioner) 10/29/2020  CHIEF COMPLAINT: Postmenopausal bleeding in the setting of history of left breast cancer on tamoxifen  SUMMARY OF ONCOLOGIC HISTORY: Oncology History Overview Note  Cancer Staging Malignant neoplasm of upper-outer quadrant of left breast in female, estrogen receptor positive (HCC) Staging form: Breast, AJCC 8th Edition - Clinical stage from 12/13/2016: Stage IB (cT2, cN0, cM0, G2, ER: Positive, PR: Positive, HER2: Negative) - Signed by Feng, Yan, MD on 12/21/2016 - Pathologic stage from 01/14/2017: Stage IA (pT2, pN0, cM0, G1, ER: Positive, PR: Positive, HER2: Negative, Oncotype DX score: 4) - Signed by Feng, Yan, MD on 04/17/2017     Malignant neoplasm of upper-outer quadrant of left breast in female, estrogen receptor positive (HCC)  12/06/2016 Mammogram   Diagnostic Mammogram 12/06/16 IMPRESSION:  Suspicious mass in the left breast at 3 o'clock 2 cm from the nipple measuring 1.9 x 1.1 x 2.2 cm. RECOMMENDATION: Ultrasound-guided core biopsy of the mass in the 3 o'clock region of the left breast is recommended. The biopsy will be scheduled at the patient's convenience.   12/13/2016 Initial Biopsy   Diagnosis 12/13/16 Breast, left, needle core biopsy, 3:00 o'clock, 2cmfn - INVASIVE DUCTAL CARCINOMA - SEE COMMENT   12/16/2016 Initial  Diagnosis   Malignant neoplasm of upper-outer quadrant of left breast in female, estrogen receptor positive (HCC)   12/17/2016 Receptors her2   Estrogen Receptor: 100%, POSITIVE, STRONG STAINING INTENSITY Progesterone Receptor: 100%, POSITIVE, STRONG STAINING INTENSITY Proliferation Marker Ki67: 30% HER2 - NEGATIVE    01/14/2017 Surgery   Left breast lumpectomy with Dr. Wakefield   01/14/2017 Pathology Results   Diagnosis 01/14/17 1. Breast, lumpectomy, Left - INVASIVE DUCTAL CARCINOMA, GRADE I/III, SPANNING 2.1 CM. - DUCTAL CARCINOMA IN SITU, LOW GRADE. - INVASIVE CARCINOMA IS BROADLY PRESENT AT THE INFERIOR MARGIN OF SPECIMEN 1. - DUCTAL CARCINOMA IN SITU IS FOCALLY PRESENT AT THE INFERIOR MARGIN OF SPECIMEN 1 AND BROADLY LESS THAN 0.1 CM TO THE LATERAL MARGIN OF SPECIMEN 1. - SEE ONCOLOGY TABLE BELOW. 2. Breast, excision, Additional medial margin left - DUCTAL CARCINOMA IN SITU, LOW GRADE. - DUCTAL CARCINOMA IS FOCALLY LESS THAN 0.1 CM TO THE NEW MARGIN OF SPECIMEN 2. 3. Breast, excision, Additional lateral margin left - DUCTAL CARCINOMA IN SITU, LOW GRADE. - DUCTAL CARCINOMA IN SITU IS GREATER THAN 0.2 CM TO ALL MARGINS. 4. Breast, excision, Additional superior margin left - DUCTAL CARCINOMA IN SITU, LOW GRADE. - DUCTAL CARCINOMA IN SITU IS BROADLY LESS THAN 0.1 CM TO THE NEW MARGIN OF SPECIMEN 4. 5. Lymph node, sentinel, biopsy, Left axillary - THERE IS NO EVIDENCE OF CARCINOMA IN 1 OF 1 LYMPH NODE (0/1). 6. Breast, excision, Additional inferior margin left - DUCTAL CARCINOMA IN SITU, LOW GRADE. - DUCTAL CARCINOMA IN SITU IS GREATER THAN 0.2 CM TO ALL MARGINS.    01/14/2017 Oncotype testing   Her oncotype recurrence score is 4 and her distance recurrent on Tamoxifen alone is 3%.   03/08/2017 -   04/05/2017 Radiation Therapy   RT with Dr. Lisbeth Renshaw    06/2017 -  Anti-estrogen oral therapy   Tamoxifen daily   12/15/2017 Mammogram   12/15/2017 Mammogram IMPRESSION: New  lumpectomy site left breast. No mammographic evidence of malignancy in either breast.     CURRENT THERAPY: Adjuvant tamoxifen 20 mg daily starting 04/20/2017  INTERVAL HISTORY: Ms. Prouse presents for symptom management visit.  Last seen for routine surveillance follow-up 05/21/2020.  On 08/20/2020 she was seen by Dr. [name] for 1 month h/o postmenopausal postcoital bleeding.  Pap was negative for intraepithelial lesion, malignancy, or high risk HPV.  Pelvic ultrasound 10/14/2020 showed thickened endometrium with fluid in endometrial canal.  She underwent in-office endometrial biopsy on 10/22/2020 that showed minute strips of benign atrophic endometrium with benign endocervical glandular and squamous mucosa, negative for malignancy.  Dr. [name] felt she did not get adequate sample and an OR hysteroscopy is being scheduled.  She was referred back to [place] for discontinuation of tamoxifen and to address thrombocytopenia prior to the procedure.  Today she presents in a wheelchair with her husband.  She is feeling well overall.  She reports mild intermittent painless vaginal bleeding that is not related to intercourse.  She has pain in the left breast when she leans over or does certain activities, denies new lump/mass, discharge or inversion, or skin change.  He tolerates tamoxifen with mild hot flashes.  Denies bone or joint pain, fatigue, unintentional weight loss, abdominal pain/bloating, or other new concerns.   MEDICAL HISTORY:  Past Medical History:  Diagnosis Date   Allergy    Anxiety    Aortic stenosis    Status post bioprosthetic AVR   Arthritis    DJD   Asthma    Breast cancer ([place]) 12/13/2016   Left breast   Chronic systolic CHF (congestive heart failure) (HCC)    EF 30% 04/2014   COPD (chronic obstructive pulmonary disease) (HCC)    Coronary artery disease    per pt, had LHC prior to AVR in [place] 2016 that did not show any blockages; no stents/bypass   Gastritis     Hyperlipidemia    Hypertension    Hyperthyroidism    Multiple thyroid nodules    Osteoporosis    Paroxysmal atrial fibrillation ([place])    Pelvis fracture ([place]) 08/19/2015   MULTIPLE    Personal history of radiation therapy 2018   Spontaneous pneumothorax 11/28/2015   left    Stroke ([place]) 2000   rt hand weak    SURGICAL HISTORY: Past Surgical History:  Procedure Laterality Date   BREAST LUMPECTOMY Left 12/2016   BREAST LUMPECTOMY WITH RADIOACTIVE SEED AND SENTINEL LYMPH NODE BIOPSY Left 01/14/2017   Procedure: LEFT BREAST LUMPECTOMY WITH RADIOACTIVE SEED AND SENTINEL LYMPH NODE BIOPSY;  Surgeon: Rolm Bookbinder, MD;  Location: [facility];  Service: General;  Laterality: Left;   CARDIAC CATHETERIZATION     CHEST TUBE INSERTION  10/2015   INTRAMEDULLARY (IM) NAIL INTERTROCHANTERIC Left 12/10/2015   Procedure: INTRAMEDULLARY (IM) NAIL INTERTROCHANTRIC;  Surgeon: Leandrew Koyanagi, MD;  Location: [facility];  Service: Orthopedics;  Laterality: Left;   PLEURADESIS Left 12/03/2015   Procedure: PLEURADESIS;  Surgeon: Melrose Nakayama, MD;  Location: [facility];  Service: Thoracic;  Laterality: Left;   RESECTION OF APICAL BLEB Left 12/03/2015   Procedure: BLEBECTOMY;  Surgeon: Melrose Nakayama, MD;  Location: [facility];  Service: Thoracic;  Laterality: Left;   RIGHT/LEFT HEART CATH AND CORONARY ANGIOGRAPHY N/A 03/10/2020   Procedure:  RIGHT/LEFT HEART CATH AND CORONARY ANGIOGRAPHY;  Surgeon: [name], MD;  Location: [facility];  Service: Cardiovascular;  Laterality: N/A;   THORACOSCOPY  12/03/2015   VALVE REPLACEMENT     VIDEO ASSISTED THORACOSCOPY Left 12/03/2015   Procedure: VIDEO ASSISTED THORACOSCOPY;  Surgeon: Melrose Nakayama, MD;  Location: [facility];  Service: Thoracic;  Laterality: Left;    I have reviewed the social history and family history with the patient and they are unchanged from previous note.  ALLERGIES:  is allergic to lisinopril, tetanus toxoid adsorbed, and  other.  MEDICATIONS:  Current Outpatient Medications  Medication Sig Dispense Refill   albuterol (PROVENTIL HFA;VENTOLIN HFA) 108 (90 Base) MCG/ACT inhaler Inhale 2 puffs into the lungs every 6 (six) hours as needed for wheezing or shortness of breath.      alendronate (FOSAMAX) 70 MG tablet Take 1 tablet (70 mg total) by mouth once a week. Take with a full glass of water on an empty stomach. 13 tablet 3   ascorbic acid (VITAMIN C) 500 MG tablet Take 500 mg by mouth daily.     budesonide-formoterol (SYMBICORT) 160-4.5 MCG/ACT inhaler Inhale 2 puffs into the lungs daily as needed.     carvedilol (COREG) 12.5 MG tablet TAKE 1 TABLET BY MOUTH  TWICE DAILY WITH A MEAL 180 tablet 0   Cholecalciferol 25 MCG (1000 UT) tablet Take 1,000 Units by mouth daily.     dapagliflozin propanediol (FARXIGA) 10 MG TABS tablet Take 1 tablet (10 mg total) by mouth daily before breakfast. 30 tablet 3   diphenhydramine-acetaminophen (TYLENOL PM) 25-500 MG TABS tablet Take 2 tablets by mouth at bedtime.     fluticasone (FLONASE) 50 MCG/ACT nasal spray Place 1 spray into both nostrils daily. 16 g 3   methimazole (TAPAZOLE) 5 MG tablet Take 3 tablets (15 mg total) by mouth daily. 270 tablet 2   pantoprazole (PROTONIX) 40 MG tablet Take 1 tablet (40 mg total) by mouth daily. 30 tablet 11   rosuvastatin (CRESTOR) 40 MG tablet TAKE 1 TABLET BY MOUTH  DAILY 90 tablet 3   sacubitril-valsartan (ENTRESTO) 49-51 MG Take 1 tablet by mouth 2 (two) times daily.     senna-docusate (SENOKOT-S) 8.6-50 MG tablet Take 2 tablets at bedtime by mouth.      spironolactone (ALDACTONE) 25 MG tablet Take 0.5 tablets (12.5 mg total) by mouth daily. 45 tablet 3   tamoxifen (NOLVADEX) 20 MG tablet Take 1 tablet (20 mg total) by mouth daily. 90 tablet 3   torsemide (DEMADEX) 20 MG tablet Take 1 tablet (20 mg total) by mouth daily. 90 tablet 3   traMADol (ULTRAM) 50 MG tablet Take 1 tablet (50 mg total) by mouth 3 (three) times daily. 90 tablet 5    trolamine salicylate (ASPERCREME) 10 % cream Apply 1 application topically as needed for muscle pain.     valACYclovir (VALTREX) 500 MG tablet Take 500 mg by mouth 2 (two) times daily as needed.     warfarin (COUMADIN) 3 MG tablet Take 1 tablet daily or Take as directed by anticoagulation clinic 90 tablet 1   No current facility-administered medications for this visit.    PHYSICAL EXAMINATION: ECOG PERFORMANCE STATUS: 0 - Asymptomatic  Vitals:   10/29/20 1042  BP: 130/75  Pulse: 80  Resp: 18  Temp: (!) 96.7 F (35.9 C)  SpO2: 100%   Filed Weights   10/29/20 1042  Weight: 251 lb 1.6 oz (113.9 kg)    GENERAL:alert, no distress  and comfortable SKIN: No rash EYES: sclera clear LUNGS:  normal breathing effort NEURO: alert & oriented x 3 with fluent speech Breast exam: Without bilateral nipple discharge or inversion.  S/p left lumpectomy, incisions completely healed.  Slightly tender at the lower outer quadrant scar tissue.  No palpable mass in either breast that I could appreciate. Exam performed in wheelchair  LABORATORY DATA:  I have reviewed the data as listed CBC Latest Ref Rng & Units 10/29/2020 07/24/2020 05/21/2020  WBC 4.0 - 10.5 K/uL 4.9 5.1 5.3  Hemoglobin 12.0 - 15.0 g/dL 12.1 11.1(L) 12.4  Hematocrit 36.0 - 46.0 % 37.5 34.1(L) 38.2  Platelets 150 - 400 K/uL 91(L) 86(L) 95(L)     CMP Latest Ref Rng & Units 10/29/2020 10/27/2020 10/17/2020  Glucose 70 - 99 mg/dL 91 113(H) 94  BUN 8 - 23 mg/dL 16 16 11  Creatinine 0.44 - 1.00 mg/dL 1.64(H) 1.59(H) 1.37(H)  Sodium 135 - 145 mmol/L 141 138 140  Potassium 3.5 - 5.1 mmol/L 4.8 4.7 4.6  Chloride 98 - 111 mmol/L 108 107 112(H)  CO2 22 - 32 mmol/L 25 25 24  Calcium 8.9 - 10.3 mg/dL 9.8 9.1 9.4  Total Protein 6.5 - 8.1 g/dL 7.3 - -  Total Bilirubin 0.3 - 1.2 mg/dL 0.4 - -  Alkaline Phos 38 - 126 U/L 50 - -  AST 15 - 41 U/L 13(L) - -  ALT 0 - 44 U/L 11 - -      RADIOGRAPHIC STUDIES: I have personally reviewed the  radiological images as listed and agreed with the findings in the report. No results found.   ASSESSMENT & PLAN:   1. 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 Oncotype 4 -s/p lumpectomy 12/2016 and adjuvant radiation 03/2017 - 04/2017, began tamoxifen 06/2017 and tolerating well.  -Last mammogram 01/16/2020 was negative -Due to abnormal vaginal bleeding, discontinue tamoxifen and surveillance  -Follow-up 9/23 as scheduled for breast surveillance, review endometrial path, and discuss further antiestrogen therapy   2.  Mild thrombocytopenia, likely ITP -She has had a mild thrombocytopenia since at least 10/2015, lowest platelet 66K -CT abdomen from 07/2018 which showed cysts in the liver.   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 Silva  3. Osteoporosis and back pain -10/2018 DEXA shows osteoporosis with lowest T score -2.8, on Fosamax -Proceed with repeat DEXA to reevaluate and determine if she is a candidate for AI.  Consider changing Fosamax to Zometa infusions  4. CAD, Afib, CHF, Aortic Stenosis, H/o Stroke  -clinically stable  -On Coumadin INR closely monitored per PCP    Disposition: Ms. Tibbits appears stable.  Breast exam is benign.  She tolerates tamoxifen without significant side effects but has developed abnormal postmenopausal bleeding.  Work-up has been negative thus far, Dr. Silva is planning to proceed with hysteroscopy.  We reviewed the risk of endometrial malignancy on tamoxifen, she will discontinue now.  I recommend to repeat DEXA to see if she is a candidate for AI.   Labs reviewed, plt 91, adequate for endometrial biopsy.   F/up 9/23 as scheduled.   All questions were answered. The patient knows to  call the clinic with any problems, questions or concerns. No barriers to learning were detected.  Total encounter time was 30 minutes.       Lacie K Burton, NP 10/29/20     

## 2020-10-29 NOTE — Telephone Encounter (Signed)
Routing to [employer]

## 2020-10-30 MED ORDER — CARVEDILOL 12.5 MG PO TABS: 12.5000 mg | ORAL_TABLET | Freq: Two times a day (BID) | ORAL | 0 refills | Status: DC

## 2020-10-30 NOTE — Telephone Encounter (Signed)
Per office visit note 10/17/20 with Allena Katz, FNP, pt to continue Coreg 12.5 mg bid.  We can refill Coreg 12.5 mg BID. Thank you.

## 2020-10-30 NOTE — Telephone Encounter (Signed)
Patient calling to check status of refill.  °

## 2020-10-30 NOTE — Telephone Encounter (Signed)
Clearance received from hematology.   Dr. [name] -please provide additional surgery details for surgery scheduling.   Cc: Hayley Carder

## 2020-10-30 NOTE — Telephone Encounter (Signed)
Pt confirmed that she is taking Carvedilol 12.5 mg bid. Please advise Pt has that she is taking Carvedilol 25 mg bid per las OV but that she is taking Carvedilol 12.5 bid on her current medication list. Pt requesting Rx 90 day.

## 2020-10-30 NOTE — Telephone Encounter (Signed)
Requested Prescriptions   Signed Prescriptions Disp Refills   carvedilol (COREG) 12.5 MG tablet 180 tablet 0    Sig: Take 1 tablet (12.5 mg total) by mouth 2 (two) times daily with a meal.    Authorizing Provider: END, CHRISTOPHER    Ordering User: Britt Bottom

## 2020-10-30 NOTE — Addendum Note (Signed)
Addended by: Britt Bottom on: 10/30/2020 09:56 AM   Modules accepted: Orders

## 2020-10-30 NOTE — Telephone Encounter (Signed)
-----   Message from Nunzio Cobbs, MD sent at 10/28/2020  7:09 AM EDT ----- Please report benign endometrial biopsy result.  The specimen was scant, and I do recommend proceeding with the dilation and curettage.

## 2020-10-30 NOTE — Telephone Encounter (Signed)
Ramond Craver, RMA  10/28/2020  9:51 AM EDT Back to Top    Spoke with patient and informed her.  She is agreeable to proceed with D&C. Will route this to [name], RN.

## 2020-10-31 ENCOUNTER — Telehealth: Payer: Self-pay | Admitting: Internal Medicine

## 2020-10-31 ENCOUNTER — Other Ambulatory Visit (HOSPITAL_COMMUNITY): Payer: Self-pay | Admitting: Obstetrics and Gynecology

## 2020-10-31 ENCOUNTER — Other Ambulatory Visit: Payer: Self-pay | Admitting: Obstetrics and Gynecology

## 2020-10-31 DIAGNOSIS — N950 Postmenopausal bleeding: Secondary | ICD-10-CM

## 2020-10-31 NOTE — Telephone Encounter (Signed)
Please schedule ultrasound guided hysteroscopy with dilation and curettage for postmenopausal bleeding.   Time needed is 1 hour.   I would like to do outpatient surgery at the [facility].   Please communicate back to cardiology that I will be proceeding formally in the operating room. They will need to be aware and readvise about the discontinuation of coumadin and advise and prescribe the Lovenox bridge.   Patient will need an anesthesia consultation in advance of surgery due to her multiple medical conditions.   She will need a preop with me.  I have read the consultation note from hematology/oncology.  Thank you.

## 2020-10-31 NOTE — Telephone Encounter (Signed)
Spoke with Glenard Haring at [facility]/ Dr. Saunders Revel.   Surgical and medical clearance requested per Dr. [name]. Their office will notify once completed.

## 2020-10-31 NOTE — Telephone Encounter (Signed)
Patient with diagnosis of afib on warfarin for anticoagulation.    Procedure:  Ultrasound guided hysteroscopy with dilation and curettage  Date of procedure: 11/17/20   CHA2DS2-VASc Score = 7   This indicates a 11.2% annual risk of stroke. The patient's score is based upon: CHF History: Yes HTN History: Yes Diabetes History: No Stroke History: Yes Vascular Disease History: Yes Age Score: 1 Gender Score: 1      CrCl 39 mml/min Platelet count 91  Per office protocol, patient can hold warfarin for 5 days prior to procedure.    Would usually recommend a bridge due to hx of 2 CVA, however patient has plt count of 91 therefore I will defer to Dr. Saunders Revel.

## 2020-10-31 NOTE — Telephone Encounter (Signed)
Please advise on holding Coumadin prior to ultrasound-guided hysteroscopy with D&C.  Surgery scheduled for 11/17/2020.  Thank you.

## 2020-10-31 NOTE — Telephone Encounter (Signed)
   [facility] Pre-operative Risk Assessment    Patient Name: Danielle [name]  DOB: 04-Feb-1946 MRN: [id number]  HEARTCARE STAFF:  - IMPORTANT!!!!!! Under Visit Info/Reason for Call, type in Other and utilize the format Clearance MM/DD/YY or Clearance TBD. Do not use dashes or single digits. - Please review there is not already an duplicate clearance open for this procedure. - If request is for dental extraction, please clarify the # of teeth to be extracted. - If the patient is currently at the dentist's office, call Pre-Op Callback Staff (MA/nurse) to input urgent request.  - If the patient is not currently in the dentist office, please route to the Pre-Op pool.  Request for surgical clearance:  What type of surgery is being performed? Ultrasound guided hysteroscopy with dilation and curettage   When is this surgery scheduled? 11/17/20  What type of clearance is required (medical clearance vs. Pharmacy clearance to hold med vs. Both)? Both  Are there any medications that need to be held prior to surgery and how long? Coumadin   Practice name and name of physician performing surgery? Dr. [name] - [facility]  What is the office phone number? [phone]   7.   What is the office fax number? [phone]  8.   Anesthesia type (None, local, MAC, general) ? General   Angeline S Hammer 10/31/2020, 10:08 AM  _________________________________________________________________   (provider comments below)

## 2020-10-31 NOTE — Telephone Encounter (Signed)
Spoke with patient. Reviewed surgery dates. Patient request to proceed with surgery on 11/17/20.  Advised patient I will forward to business office for return call. I will return call once surgery date and time confirmed.   Advised patient I will also be contacting cardiology for further instruction on coumadin and lovenox bridge.   Patient verbalizes understanding and is agreeable.   Surgery request sent.   Routing to [employer] to review benefits.

## 2020-11-04 NOTE — Telephone Encounter (Signed)
Looks like thrombocytopenia has been chronic and is fairly stable.  I would favor bridging her given her high CHADSVASC score.  Thanks.  [name], MD [facility]

## 2020-11-04 NOTE — Telephone Encounter (Signed)
Both numbers are invalid. I have sent a MyChart message.

## 2020-11-05 NOTE — Telephone Encounter (Signed)
Will work on lovenox bridge. Pt has apt on 9/12 with coumadin clinic when lovenox bridge and coumadin dosing can be discussed.

## 2020-11-06 NOTE — Telephone Encounter (Signed)
Proposed lovenox bridge for procedure on 9/19; using 120 mg lovenox syringes Q12H  9/13: Last dose of coumadin 9/14: No coumadin, no lovenox 9/15: No coumadin, lovenox Q12H 9/16: No coumadin, lovenox Q12H 9/17: No coumadin, lovenox Q12H 9/18: No coumadin, lovenox early AM only  9/19: Procedure Day  9/20: Take 4.'5mg'$  coumadin, lovenox Q12H 9/21: Take '3mg'$  coumadin, lovenox Q12H 9/22: Take 4.'5mg'$  coumadin, lovenox Q12H 9/23: Take '3mg'$  coumadin, lovenox Q12H 9/24: Normal coumadin dosing, lovenox Q12H 9/25: Normal coumadin dosing, lovenox Q12H 9/26: Hold coumadin and hold lovenox ; check INR

## 2020-11-06 NOTE — Telephone Encounter (Signed)
Spoke with patient. Surgery date request confirmed.  Advised surgery is scheduled for 11/17/20, [place] at 79.  Surgery instruction sheet and hospital brochure reviewed, printed copy will be mailed.  Patient advised of Covid screening and quarantine requirements and agreeable.    Patient will contact cardiology to confirm upcoming appts. Patient is aware further instruction will be provided on Coumadin and lovenox at her visit on 11/10/20. I will also follow for recommendations and cardiac clearance.   Pre-op completed on 10/22/20.   Routing to Dr. Antony Blackbird.

## 2020-11-06 NOTE — Telephone Encounter (Signed)
Per review of Epic, patient has appt with Coumadin Clinic on 11/10/20 -will plan to discuss Coumadin dosing and Lovenox Bridge. Patient also has an appt with Dr. Saunders Revel on 11/12/20 for Cardiac Clearance.   Call to patient. Left message to call Sharee Pimple, RN at [facility], [phone].

## 2020-11-06 NOTE — Telephone Encounter (Signed)
   Name: Danielle [name]  DOB: September 14, 1945  MRN: [id number]   Primary Cardiologist: [name], MD  Chart reviewed as part of pre-operative protocol coverage. Patient was contacted 11/06/2020 in reference to pre-operative risk assessment for pending surgery as outlined below.  Danielle [name] was last seen on 10/17/20 by Allena Katz NP.  Since that day, Danielle [name] has done well. Case was discussed with Dr. Aundra Dubin who agreed that she could proceed with surgery. I reached out to the patient and confirmed no new or worsening symptoms.   Per our clinical pharmacist and Dr. Saunders Revel: Pt will require bridging for procedure. These instructions have been outlined for her.    Therefore, based on ACC/AHA guidelines, the patient would be at acceptable risk for the planned procedure without further cardiovascular testing.   The patient was advised that if she develops new symptoms prior to surgery to contact our office to arrange for a follow-up visit, and she verbalized understanding.  I will route this recommendation to the requesting party via Epic fax function and remove from pre-op pool. Please call with questions.  [place], PA 11/06/2020, 5:30 PM

## 2020-11-06 NOTE — Telephone Encounter (Signed)
Kataliya is calling due to receiving Angela's my chart message. Number on file for patient is up to date. Please advise.

## 2020-11-06 NOTE — Telephone Encounter (Signed)
Encounter reviewed.   Please schedule a preop visit with me.

## 2020-11-06 NOTE — Telephone Encounter (Signed)
I will defer recommendations about anticoagulation/bridging to Dr. [name], who manages this for [name].  Danielle Bush, MD [facility]

## 2020-11-07 ENCOUNTER — Other Ambulatory Visit: Payer: Self-pay | Admitting: Obstetrics and Gynecology

## 2020-11-07 ENCOUNTER — Other Ambulatory Visit: Payer: Self-pay | Admitting: Gynecology

## 2020-11-07 ENCOUNTER — Other Ambulatory Visit (HOSPITAL_COMMUNITY): Payer: Self-pay | Admitting: Obstetrics and Gynecology

## 2020-11-07 DIAGNOSIS — N950 Postmenopausal bleeding: Secondary | ICD-10-CM

## 2020-11-07 NOTE — Telephone Encounter (Signed)
Spoke with patient. Pre-op scheduled for 11/10/20 at 2:15pm.

## 2020-11-08 NOTE — Progress Notes (Signed)
GYNECOLOGY  VISIT   HPI: 75 y.o.   Married  [place] American  female   [id number] with No LMP recorded. Patient is postmenopausal.   here for pre-op. Surgery scheduled 11/17/20.  She is scheduled for ultrasound guided hysteroscopy with dilation and curettage and Myosure resection of endometrial thickening.   Patient has postmenopausal bleeding and thickened endometrium measuring 11.79 mm and fluid in the endometrial canal on pelvic ultrasound while taking Tamoxifen.  Her endometrial biopsy shows benign endometrium.  She had evidence of cervical stenosis at the time of her biopsy which is thought to not represent the status of her endometrium.  Patient does have congestive heart failure and atrial fibrillation.  Has appt with cardiology on 11/12/20, Dr. Saunders Revel.  She does take coumadin and has met with her coumadin clinic to receive instructions for discontinuation of coumadin and initiation of a Lovenox bridge in preparation for upcoming surgery.   She has also had consultation with oncology regarding her Tamoxifen therapy and low platelets.  Her Tamoxifen was discontinued.  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  Her last platelet level measured 91,000 on 10/29/20.   Patient reports she is feeling stressed and is having herpes outbreak.  She usually takes Valtrex 500 mg po bid x 3 days. She has not taken this for a long time.   She does have chronically elevated creatinine.  Creatinine 1.78 on 9//12/22.  Creatinine has fluctuated from 1.18 to 1.78 since July, 2022.   GYNECOLOGIC HISTORY: No LMP recorded. Patient is postmenopausal. Contraception:  PMP Menopausal hormone therapy:  N/A Last mammogram:  01-16-20  Diag.Bil./status post Lt.lumpectomy 2018/Neg/BiRads2 Last pap smear:   08-20-20 Neg:Neg HR HPV. Years ago normal always        OB History     Gravida  0   Para  0   Term  0   Preterm  0   AB  0   Living  0      SAB  0   IAB  0   Ectopic  0   Multiple   0   Live Births  0              Patient Active Problem List   Diagnosis Date Noted   Gait abnormality 07/31/2020   Paresthesia 07/31/2020   Low back pain with bilateral sciatica 07/31/2020   Pneumonia due to COVID-19 virus    Bacteremia due to Gram-positive bacteria 08/29/2018   Abscess    Cellulitis of left breast 08/28/2018   Cellulitis of left abdominal wall 08/28/2018   Encounter for routine gynecological examination 04/28/2018   Stage 3 chronic kidney disease ([place]) 09/27/2017   Recurrent genital herpes 04/13/2017   Chronic pain disorder 01/25/2017   Malignant neoplasm of upper-outer quadrant of left breast in female, estrogen receptor positive ([place]) 12/16/2016   Chronic knee pain 12/07/2016   Chronic bilateral low back pain without sciatica 12/07/2016   Peripheral musculoskeletal gait disorder 12/07/2016   Encounter for therapeutic drug monitoring 06/16/2016   Generalized osteoarthritis of multiple sites 05/04/2016   Chronic anticoagulation 05/04/2016   Stroke ([place]) 04/14/2016   Aortic valve disease 04/10/2016   S/P AVR 04/10/2016   NICM (nonischemic cardiomyopathy) ([place]) 04/10/2016   Upper airway cough syndrome 03/14/2016   Morbid obesity ([place]) 03/14/2016   COPD (chronic obstructive pulmonary disease) ([place]) 03/11/2016   Thrombocytopenia ([place]) 12/12/2015   Anemia 12/12/2015   Vitamin D deficiency 12/12/2015   Acute urinary retention 12/11/2015   Osteoporosis 12/11/2015  Hip fracture ([place]) 12/10/2015   Aortic atherosclerosis ([place]) 12/10/2015   Lung blebs ([place]) 12/03/2015   Pelvic fracture ([place]) 08/19/2015   Fall at home 08/19/2015   Essential hypertension 08/19/2015   Mixed hyperlipidemia 08/19/2015   Asthma 08/19/2015   Paroxysmal atrial fibrillation ([place]) 08/19/2015   Hyperthyroidism 08/19/2015   Chronic HFrEF (heart failure with reduced ejection fraction) ([place])    Coronary artery disease    Aortic stenosis     Past Medical History:  Diagnosis Date    Allergy    Anxiety    Aortic stenosis    Status post bioprosthetic AVR   Arthritis    DJD   Asthma    Breast cancer ([place]) 12/13/2016   Left breast   Chronic systolic CHF (congestive heart failure) ([facility])    EF 30% 04/2014   COPD (chronic obstructive pulmonary disease) ([facility])    Coronary artery disease    per pt, had LHC prior to AVR in [place] 2016 that did not show any blockages; no stents/bypass   Gastritis    Hyperlipidemia    Hypertension    Hyperthyroidism    Multiple thyroid nodules    Osteoporosis    Paroxysmal atrial fibrillation ([place])    Pelvis fracture ([place]) 08/19/2015   MULTIPLE    Personal history of radiation therapy 2018   Spontaneous pneumothorax 11/28/2015   left    Stroke ([place]) 2000   rt hand weak    Past Surgical History:  Procedure Laterality Date   BREAST LUMPECTOMY Left 12/2016   BREAST LUMPECTOMY WITH RADIOACTIVE SEED AND SENTINEL LYMPH NODE BIOPSY Left 01/14/2017   Procedure: LEFT BREAST LUMPECTOMY WITH RADIOACTIVE SEED AND SENTINEL LYMPH NODE BIOPSY;  Surgeon: Rolm Bookbinder, MD;  Location: [facility];  Service: General;  Laterality: Left;   CARDIAC CATHETERIZATION     CHEST TUBE INSERTION  10/2015   INTRAMEDULLARY (IM) NAIL INTERTROCHANTERIC Left 12/10/2015   Procedure: INTRAMEDULLARY (IM) NAIL INTERTROCHANTRIC;  Surgeon: Leandrew Koyanagi, MD;  Location: [facility];  Service: Orthopedics;  Laterality: Left;   PLEURADESIS Left 12/03/2015   Procedure: PLEURADESIS;  Surgeon: Melrose Nakayama, MD;  Location: [facility];  Service: Thoracic;  Laterality: Left;   RESECTION OF APICAL BLEB Left 12/03/2015   Procedure: BLEBECTOMY;  Surgeon: Melrose Nakayama, MD;  Location: [facility];  Service: Thoracic;  Laterality: Left;   RIGHT/LEFT HEART CATH AND CORONARY ANGIOGRAPHY N/A 03/10/2020   Procedure: RIGHT/LEFT HEART CATH AND CORONARY ANGIOGRAPHY;  Surgeon: [name], MD;  Location: [facility];  Service: Cardiovascular;  Laterality: N/A;   THORACOSCOPY   12/03/2015   VALVE REPLACEMENT     VIDEO ASSISTED THORACOSCOPY Left 12/03/2015   Procedure: VIDEO ASSISTED THORACOSCOPY;  Surgeon: Melrose Nakayama, MD;  Location: [facility];  Service: Thoracic;  Laterality: Left;    Current Outpatient Medications  Medication Sig Dispense Refill   albuterol (PROVENTIL HFA;VENTOLIN HFA) 108 (90 Base) MCG/ACT inhaler Inhale 2 puffs into the lungs every 6 (six) hours as needed for wheezing or shortness of breath.      alendronate (FOSAMAX) 70 MG tablet Take 1 tablet (70 mg total) by mouth once a week. Take with a full glass of water on an empty stomach. 13 tablet 3   ascorbic acid (VITAMIN C) 500 MG tablet Take 500 mg by mouth daily.     budesonide-formoterol (SYMBICORT) 160-4.5 MCG/ACT inhaler Inhale 2 puffs into the lungs daily as needed.     carvedilol (COREG) 12.5 MG tablet Take 1 tablet (12.5  mg total) by mouth 2 (two) times daily with a meal. 180 tablet 0   Cholecalciferol 25 MCG (1000 UT) tablet Take 1,000 Units by mouth daily.     dapagliflozin propanediol (FARXIGA) 10 MG TABS tablet Take 1 tablet (10 mg total) by mouth daily before breakfast. 30 tablet 3   diphenhydramine-acetaminophen (TYLENOL PM) 25-500 MG TABS tablet Take 2 tablets by mouth at bedtime.     enoxaparin (LOVENOX) 120 MG/0.8ML injection Inject 0.8 mLs (120 mg total) into the skin every 12 (twelve) hours. 15.2 mL 0   fluticasone (FLONASE) 50 MCG/ACT nasal spray Place 1 spray into both nostrils daily. 16 g 3   methimazole (TAPAZOLE) 5 MG tablet Take 3 tablets (15 mg total) by mouth daily. 270 tablet 2   pantoprazole (PROTONIX) 40 MG tablet Take 1 tablet (40 mg total) by mouth daily. 30 tablet 11   rosuvastatin (CRESTOR) 40 MG tablet TAKE 1 TABLET BY MOUTH  DAILY 90 tablet 3   sacubitril-valsartan (ENTRESTO) 49-51 MG Take 1 tablet by mouth 2 (two) times daily.     senna-docusate (SENOKOT-S) 8.6-50 MG tablet Take 2 tablets at bedtime by mouth.      spironolactone (ALDACTONE) 25 MG tablet Take  0.5 tablets (12.5 mg total) by mouth daily. 45 tablet 3   tamoxifen (NOLVADEX) 20 MG tablet Take 1 tablet (20 mg total) by mouth daily. 90 tablet 3   torsemide (DEMADEX) 20 MG tablet Take 1 tablet (20 mg total) by mouth daily. 90 tablet 3   traMADol (ULTRAM) 50 MG tablet Take 1 tablet (50 mg total) by mouth 3 (three) times daily. 90 tablet 5   trolamine salicylate (ASPERCREME) 10 % cream Apply 1 application topically as needed for muscle pain.     valACYclovir (VALTREX) 500 MG tablet Take on tablet by mouth twice a day for 3 days as needed for an outbreak 30 tablet 0   warfarin (COUMADIN) 3 MG tablet Take 1 tablet daily or Take as directed by anticoagulation clinic (Patient not taking: Reported on 11/12/2020) 90 tablet 1   No current facility-administered medications for this visit.     ALLERGIES: Lisinopril, Tetanus toxoid adsorbed, and Other  Family History  Problem Relation Age of Onset   Diabetes Mother    Heart attack Mother 76   Diabetes Father    Lung cancer Father    Diabetes Sister    Thyroid disease Sister    Diabetes Sister    HIV Brother     Social History   Socioeconomic History   Marital status: Married    Spouse name: [name]   Number of children: 0   Years of education: 12   Highest education level: 12th grade  Occupational History   Occupation: Retired in 2004  Tobacco Use   Smoking status: Former    Packs/day: 0.25    Years: 10.00    Pack years: 2.50    Types: Cigarettes    Quit date: 03/01/2010    Years since quitting: 10.7   Smokeless tobacco: Never  Vaping Use   Vaping Use: Never used  Substance and Sexual Activity   Alcohol use: No   Drug use: No   Sexual activity: Yes    Birth control/protection: Post-menopausal  Other Topics Concern   Not on file  Social History Narrative   Lives with husband.  Ambulated independently.   Right-handed.   No daily caffeine use.   Social Determinants of Health   Financial Resource Strain: Medium Risk  Difficulty of Paying Living Expenses: Somewhat hard  Food Insecurity: No Food Insecurity   Worried About Running Out of Food in the Last Year: Never true   Ran Out of Food in the Last Year: Never true  Transportation Needs: No Transportation Needs   Lack of Transportation (Medical): No   Lack of Transportation (Non-Medical): No  Physical Activity: Sufficiently Active   Days of Exercise per Week: 7 days   Minutes of Exercise per Session: 30 min  Stress: No Stress Concern Present   Feeling of Stress : Only a little  Social Connections: Moderately Integrated   Frequency of Communication with Friends and Family: More than three times a week   Frequency of Social Gatherings with Friends and Family: More than three times a week   Attends Religious Services: More than 4 times per year   Active Member of [employer] or Organizations: No   Attends Archivist Meetings: Never   Marital Status: Married  Human resources officer Violence: Not At Risk   Fear of Current or Ex-Partner: No   Emotionally Abused: No   Physically Abused: No   Sexually Abused: No    Review of Systems  All other systems reviewed and are negative.  PHYSICAL EXAMINATION:    BP 117/72 (BP Location: Right Arm, Patient Position: Sitting)   Pulse 80   Resp 20     General appearance: alert, cooperative and appears stated age Head: Normocephalic, without obvious abnormality, atraumatic Lungs: clear to auscultation bilaterally Heart: regular rate and rhythm Abdomen: soft, non-tender, no masses,  no organomegaly Extremities: extremities normal, atraumatic, no cyanosis or edema Skin: Skin color, texture, turgor normal. No rashes or lesions Lymph nodes: Cervical, supraclavicular, and axillary nodes normal. No abnormal inguinal nodes palpated Neurologic: Grossly normal  Pelvic: Deferred.  ASSESSMENT  Postmenopausal bleeding.  Thickened endometrium with fluid in endometrial canal.  Cervical stenosis.  Hx breast cancer.   Recent discontinuation of Tamoxifen.  CHF.  Atrial fibrillation.  On coumadin.  COPD.  Low platelets.  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 HSV.  Elevated creatinine. Fluctuating levels.   PLAN  Will proceed with hysteroscopy with Myosure resection of endometrial thickening, dilation and curettage with ultrasound guidance.  Risks, benefits, and alternatives have been discussed with the patient who wishes to proceed.  Surgical expectations and recovery discussed.  Questions invited and answered.  Patient has received instructions from coumadin clinic for discontinuation of coumadin in preparation for surgery and then a Lovenox bridge.  She will bring her inhaler to the hospital with her for her procedure.  Rx for Valtrex.  I prescribed for her Valtrex 500 mg po bid x 3 days prn outbreak.  She will follow up after surgery for a 2 week post op check.   An After Visit Summary was printed and given to the patient.

## 2020-11-10 ENCOUNTER — Encounter: Payer: Self-pay | Admitting: Obstetrics and Gynecology

## 2020-11-10 ENCOUNTER — Other Ambulatory Visit (HOSPITAL_COMMUNITY): Payer: Self-pay

## 2020-11-10 ENCOUNTER — Other Ambulatory Visit: Payer: Self-pay

## 2020-11-10 ENCOUNTER — Ambulatory Visit (HOSPITAL_COMMUNITY)
Admission: RE | Admit: 2020-11-10 | Discharge: 2020-11-10 | Disposition: A | Discharge status: Home / Self Care | Payer: Medicare Other | Source: Ambulatory Visit | Attending: Cardiology | Admitting: Cardiology

## 2020-11-10 ENCOUNTER — Other Ambulatory Visit (HOSPITAL_COMMUNITY): Payer: Self-pay | Admitting: Cardiology

## 2020-11-10 ENCOUNTER — Ambulatory Visit (INDEPENDENT_AMBULATORY_CARE_PROVIDER_SITE_OTHER): Payer: Medicare Other

## 2020-11-10 ENCOUNTER — Ambulatory Visit: Payer: Medicare Other | Admitting: Obstetrics and Gynecology

## 2020-11-10 VITALS — BP 117/72 | HR 80 | Resp 20

## 2020-11-10 DIAGNOSIS — I110 Hypertensive heart disease with heart failure: Secondary | ICD-10-CM | POA: Diagnosis not present

## 2020-11-10 DIAGNOSIS — I5022 Chronic systolic (congestive) heart failure: Secondary | ICD-10-CM | POA: Diagnosis not present

## 2020-11-10 DIAGNOSIS — I10 Essential (primary) hypertension: Secondary | ICD-10-CM

## 2020-11-10 DIAGNOSIS — Z7901 Long term (current) use of anticoagulants: Secondary | ICD-10-CM

## 2020-11-10 DIAGNOSIS — R9389 Abnormal findings on diagnostic imaging of other specified body structures: Secondary | ICD-10-CM

## 2020-11-10 DIAGNOSIS — N950 Postmenopausal bleeding: Secondary | ICD-10-CM

## 2020-11-10 DIAGNOSIS — Z79899 Other long term (current) drug therapy: Secondary | ICD-10-CM | POA: Insufficient documentation

## 2020-11-10 DIAGNOSIS — Z01818 Encounter for other preprocedural examination: Secondary | ICD-10-CM

## 2020-11-10 LAB — BASIC METABOLIC PANEL
Anion gap: 7 (ref 5–15)
BUN: 22 mg/dL (ref 8–23)
CO2: 24 mmol/L (ref 22–32)
Calcium: 9.6 mg/dL (ref 8.9–10.3)
Chloride: 108 mmol/L (ref 98–111)
Creatinine, Ser: 1.78 mg/dL — ABNORMAL HIGH (ref 0.44–1.00)
GFR, Estimated: 30 mL/min — ABNORMAL LOW (ref >60)
Glucose, Bld: 102 mg/dL — ABNORMAL HIGH (ref 70–99)
Potassium: 4.5 mmol/L (ref 3.5–5.1)
Sodium: 139 mmol/L (ref 135–145)

## 2020-11-10 LAB — POCT INR: INR: 2.9 (ref 2.0–3.0)

## 2020-11-10 MED ORDER — VALACYCLOVIR HCL 500 MG PO TABS: ORAL_TABLET | ORAL | 0 refills | Status: DC

## 2020-11-10 MED ORDER — ENOXAPARIN SODIUM 120 MG/0.8ML IJ SOSY
120.0000 mg | PREFILLED_SYRINGE | Freq: Two times a day (BID) | INTRAMUSCULAR | 0 refills | Status: DC
  Filled 2020-11-10: qty 15.2, 10d supply, fill #0

## 2020-11-10 NOTE — Patient Instructions (Addendum)
Pre visit review using our clinic review tool, if applicable. No additional management support is needed unless otherwise documented below in the visit note.  Follow dosing below. If any questions please call [phone]. Prescription sent to [facility]  9/13: Last dose of coumadin 9/14: No coumadin, no lovenox 9/15: No coumadin, lovenox every 12 hours 9/16: No coumadin, lovenox every 12 hours 9/17: No coumadin, lovenox every 12 hours 9/18: No coumadin, lovenox early AM only   9/19: Procedure Day; NO coumadin, NO lovenox   9/20: Take 4.'5mg'$  coumadin, lovenox every 12 hours 9/21: Take '3mg'$  coumadin, lovenox every 12 hours 9/22: Take 4.'5mg'$  coumadin, lovenox every 12 hours 9/23: Take '3mg'$  coumadin, lovenox every 12 hours 9/24: Normal coumadin dosing, lovenox every 12 hours 9/25: Normal coumadin dosing, lovenox every 12 hours 9/26: Normal coumadin dosing, no lovenox 9/27: Recheck INR @ 8:30 at [facility], [place]

## 2020-11-10 NOTE — Telephone Encounter (Signed)
Per discussion with Dr. [name], lovenox bridge and coumadin dosing authorized to proceed. Ok to send in lovenox prescription.

## 2020-11-11 ENCOUNTER — Other Ambulatory Visit (HOSPITAL_COMMUNITY): Payer: Self-pay

## 2020-11-12 ENCOUNTER — Other Ambulatory Visit: Payer: Self-pay

## 2020-11-12 ENCOUNTER — Ambulatory Visit: Payer: Medicare Other | Admitting: Internal Medicine

## 2020-11-12 ENCOUNTER — Encounter: Payer: Self-pay | Admitting: Internal Medicine

## 2020-11-12 VITALS — BP 100/70 | HR 88 | Ht 67.0 in | Wt 254.0 lb

## 2020-11-12 DIAGNOSIS — Z952 Presence of prosthetic heart valve: Secondary | ICD-10-CM | POA: Diagnosis not present

## 2020-11-12 DIAGNOSIS — I5022 Chronic systolic (congestive) heart failure: Secondary | ICD-10-CM

## 2020-11-12 DIAGNOSIS — Z01810 Encounter for preprocedural cardiovascular examination: Secondary | ICD-10-CM | POA: Diagnosis not present

## 2020-11-12 DIAGNOSIS — I428 Other cardiomyopathies: Secondary | ICD-10-CM | POA: Diagnosis not present

## 2020-11-12 DIAGNOSIS — I480 Paroxysmal atrial fibrillation: Secondary | ICD-10-CM

## 2020-11-12 DIAGNOSIS — N183 Chronic kidney disease, stage 3 unspecified: Secondary | ICD-10-CM | POA: Diagnosis not present

## 2020-11-12 NOTE — Progress Notes (Signed)
Follow-up Outpatient Visit Date: 11/12/2020  Primary Care Provider: [name], [name], MD [address] [place] [address]  Chief Complaint: Follow-up heart failure  HPI:  Danielle [name] is a 75 y.o. female with history of aortic valve disease (severe regurgitation by her description) s/p bioprosthetic aortic valve replacement in [place] in 03/2014, NICM with LVEF as low as 30-35% by report in 03/2015, paroxysmal atrial fibrillation on chronic warfarin, stroke x 2, hyperlipidemia, hypertension, hyperthyroidism, left breast cancer, spontaneous pneumothorax, and left hip fracture, who presents for follow-up of failure and valvular heart disease.  I last saw her in May, at which time she continued to complain of exertional dyspnea with mild activity despite trying to increase her activity at home.  She has subsequently established in the heart failure clinic with Dr. Aundra Dubin, at which time she felt about the same as when I last saw her.  She was last seen in the heart failure clinic in mid August, at which time low-dose spironolactone was added.  She was also referred for cardiac MRI to assess for infiltrative cardiomyopathy (scheduled for 12/01/2020).  She is scheduled for dilation and curettage next week.  Today, Danielle [name] reports that she is feeling fairly well, better than at prior visits.  She still has limited mobility and gets short of breath easily.  She is not currently participating in physical therapy.  She denies chest pain, palpitations, lightheadedness, and edema.  She is hopeful that she can transition from warfarin to apixaban in the near future at the recommendation of Dr. Aundra Dubin.  --------------------------------------------------------------------------------------------------  Cardiovascular History & Procedures: Cardiovascular Problems: Aortic valve disease status post bioprosthetic AVR in 03/2014 Non-ischemic cardiomyopathy Paroxysmal atrial  fibrillation Stroke   Risk Factors: Hypertension, hyperlipidemia, stroke, and age > 26   Cath/PCI: R/LHC (03/10/2020): No angiographically significant coronary artery disease.  Mildly elevated left heart, right heart, and PA pressures.  Normal Fick cardiac output/index.  No significant AVR gradient. R/LHC (02/12/2014, [place]): Moderate proximal LAD calcification with mild, nonobstructive disease.  Mildly reduced LVEF with severe apical hypokinesis.  Normal pulmonary artery pressure.   CV Surgery: Bioprosthetic aortic valve replacement (03/12/14, [place], [place])   EP Procedures and Devices: None   Non-Invasive Evaluation(s): TTE (11/21/2019): Normal LV size with mild LVH.  LVEF 45-50% with global hypokinesis.  Normal RV size and function.  Bioprosthetic aortic valve present with appropriate function (mean gradient 10 mmHg). TTE (01/18/2019): Normal LV size with LVEF 35-40%.  Mild LVH.  Grade 1 diastolic dysfunction.  Is in function.  Bioprosthetic aortic valve with normal function.  Normal PA pressure. TTE (04/21/16): Normal obese size with moderate LVH. LVEF 35-40% with mid and apical anteroseptal hypokinesis. Grade 3 diastolic dysfunction noted. Aortic valve bioprosthesis present with a mean gradient of 11 mmHg. Mitral annular calcification noted. Normal RV size and function. Mild right atrial enlargement. TTE (03/27/15, OSH): Mild LVH with LVEF 30-35%, mild left atrial enlargement, AVR in place without regurgitation. TTE (05/10/14, OSH): LVEF 45-50%.  Recent CV Pertinent Labs: Lab Results  Component Value Date   CHOL 162 07/02/2020   HDL 73.70 07/02/2020   LDLCALC 71 07/02/2020   TRIG 90.0 07/02/2020   CHOLHDL 2 07/02/2020   INR 2.9 11/10/2020   INR 1.1 05/23/2020   BNP 84.5 10/01/2020   K 4.5 11/10/2020   K 4.3 12/22/2016   MG 1.9 12/22/2019   BUN 22 11/10/2020   BUN 11 02/27/2020   BUN 19.5 12/22/2016   CREATININE 1.78 (H)  11/10/2020   CREATININE 1.64  (H) 10/29/2020   CREATININE 1.09 (H) 12/31/2019   CREATININE 1.2 (H) 12/22/2016    Past medical and surgical history were reviewed and updated in EPIC.  Current Meds  Medication Sig   albuterol (PROVENTIL HFA;VENTOLIN HFA) 108 (90 Base) MCG/ACT inhaler Inhale 2 puffs into the lungs every 6 (six) hours as needed for wheezing or shortness of breath.    alendronate (FOSAMAX) 70 MG tablet Take 1 tablet (70 mg total) by mouth once a week. Take with a full glass of water on an empty stomach.   ascorbic acid (VITAMIN C) 500 MG tablet Take 500 mg by mouth daily.   budesonide-formoterol (SYMBICORT) 160-4.5 MCG/ACT inhaler Inhale 2 puffs into the lungs daily as needed.   carvedilol (COREG) 12.5 MG tablet Take 1 tablet (12.5 mg total) by mouth 2 (two) times daily with a meal.   Cholecalciferol 25 MCG (1000 UT) tablet Take 1,000 Units by mouth daily.   dapagliflozin propanediol (FARXIGA) 10 MG TABS tablet Take 1 tablet (10 mg total) by mouth daily before breakfast.   diphenhydramine-acetaminophen (TYLENOL PM) 25-500 MG TABS tablet Take 2 tablets by mouth at bedtime.   enoxaparin (LOVENOX) 120 MG/0.8ML injection Inject 0.8 mLs (120 mg total) into the skin every 12 (twelve) hours.   fluticasone (FLONASE) 50 MCG/ACT nasal spray Place 1 spray into both nostrils daily.   methimazole (TAPAZOLE) 5 MG tablet Take 3 tablets (15 mg total) by mouth daily.   pantoprazole (PROTONIX) 40 MG tablet Take 1 tablet (40 mg total) by mouth daily.   rosuvastatin (CRESTOR) 40 MG tablet TAKE 1 TABLET BY MOUTH  DAILY   sacubitril-valsartan (ENTRESTO) 49-51 MG Take 1 tablet by mouth 2 (two) times daily.   senna-docusate (SENOKOT-S) 8.6-50 MG tablet Take 2 tablets at bedtime by mouth.    spironolactone (ALDACTONE) 25 MG tablet Take 0.5 tablets (12.5 mg total) by mouth daily.   tamoxifen (NOLVADEX) 20 MG tablet Take 1 tablet (20 mg total) by mouth daily.   torsemide (DEMADEX) 20 MG tablet Take 1 tablet (20 mg total) by mouth  daily.   traMADol (ULTRAM) 50 MG tablet Take 1 tablet (50 mg total) by mouth 3 (three) times daily.   trolamine salicylate (ASPERCREME) 10 % cream Apply 1 application topically as needed for muscle pain.   valACYclovir (VALTREX) 500 MG tablet Take on tablet by mouth twice a day for 3 days as needed for an outbreak    Allergies: Lisinopril, Tetanus toxoid adsorbed, and Other  Social History   Tobacco Use   Smoking status: Former    Packs/day: 0.25    Years: 10.00    Pack years: 2.50    Types: Cigarettes    Quit date: 03/01/2010    Years since quitting: 10.7   Smokeless tobacco: Never  Vaping Use   Vaping Use: Never used  Substance Use Topics   Alcohol use: No   Drug use: No    Family History  Problem Relation Age of Onset   Diabetes Mother    Heart attack Mother 2   Diabetes Father    Lung cancer Father    Diabetes Sister    Thyroid disease Sister    Diabetes Sister    HIV Brother     Review of Systems: A 12-system review of systems was performed and was negative except as noted in the HPI.  --------------------------------------------------------------------------------------------------  Physical Exam: BP 100/70 (BP Location: Right Arm, Patient Position: Sitting, Cuff Size: Large)  Pulse 88   Ht '5\' 7"'$  (1.702 m)   Wt 254 lb (115.2 kg)   BMI 39.78 kg/m   General:  NAD. Neck: No JVD or HJR. Lungs: Clear to auscultation bilaterally without wheezes or crackles. Heart: Regular rate and rhythm with 1/6 systolic murmur. Abdomen: Soft, nontender, nondistended. Extremities: Trace pretibial edema  EKG: Normal sinus rhythm with PVCs, inferior Q waves, and poor R wave progression.  Compared with prior tracing from 10/27/2020, PVCs are now present.  Otherwise, no significant change.  Lab Results  Component Value Date   WBC 4.9 10/29/2020   HGB 12.1 10/29/2020   HCT 37.5 10/29/2020   MCV 96.4 10/29/2020   PLT 91 (L) 10/29/2020    Lab Results  Component Value  Date   NA 139 11/10/2020   K 4.5 11/10/2020   CL 108 11/10/2020   CO2 24 11/10/2020   BUN 22 11/10/2020   CREATININE 1.78 (H) 11/10/2020   GLUCOSE 102 (H) 11/10/2020   ALT 11 10/29/2020    Lab Results  Component Value Date   CHOL 162 07/02/2020   HDL 73.70 07/02/2020   LDLCALC 71 07/02/2020   TRIG 90.0 07/02/2020   CHOLHDL 2 07/02/2020    --------------------------------------------------------------------------------------------------  ASSESSMENT AND PLAN: Chronic HFrEF due to nonischemic cardiomyopathy: Ms. Slavick appears euvolemic with persistent NYHA class III symptoms albeit slightly better than at prior visits.  I appreciate assistance of the advanced heart failure team.  We will continue her current medications including Entresto, spironolactone, carvedilol, dapagliflozin, and torsemide.  It should be noted that her creatinine has trended up over the last few months, which will need to be monitored closely.  Paroxysmal atrial fibrillation: EKG today again shows sinus rhythm with some PVCs.  She is currently undergoing Lovenox bridging in anticipation of D&C next month.  She is interested in transitioning to NOAC in the future, which is reasonable.  I will defer this change to Dr. Aundra Dubin and or Dr. [name], who has been managing her warfarin.  In the past, apixaban was cost prohibitive.  Status post AVR: No evidence of worsening heart failure by exam.  Continue clinical follow-up.  Chronic kidney disease: Creatinine gradually trending up in the setting of titration of GDMT per the advanced heart failure to.  Continue close follow-up per Dr. Aundra Dubin.  Preop cardiovascular risk assessment: Ms. Tomlinson has stable chronic heart failure and appears well compensated today.  It is reasonable for her to proceed with D&C next week, as scheduled, as additional cardiac testing/intervention is unlikely to further medicate her perioperative risk during this low risk  procedure.  Warfarin is currently on hold with ongoing Lovenox bridge.  Follow-up: Return to clinic in 6 months  [name], MD 11/12/2020 1:04 PM

## 2020-11-12 NOTE — Patient Instructions (Signed)
Medication Instructions:   Your physician recommends that you continue on your current medications as directed. Please refer to the Current Medication list given to you today.  *If you need a refill on your cardiac medications before your next appointment, please call your pharmacy*   Lab Work:  None ordered  Testing/Procedures:  None ordered   Follow-Up: At [facility], you and your health needs are our priority.  As part of our continuing mission to provide you with exceptional heart care, we have created designated Provider Care Teams.  These Care Teams include your primary Cardiologist (physician) and Advanced Practice Providers (APPs -  Physician Assistants and Nurse Practitioners) who all work together to provide you with the care you need, when you need it.  We recommend signing up for the patient portal called "MyChart".  Sign up information is provided on this After Visit Summary.  MyChart is used to connect with patients for Virtual Visits (Telemedicine).  Patients are able to view lab/test results, encounter notes, upcoming appointments, etc.  Non-urgent messages can be sent to your provider as well.   To learn more about what you can do with MyChart, go to [email or web address].    Your next appointment:   6 month(s)  The format for your next appointment:   In Person  Provider:   You may see Christopher End, MD or one of the following Advanced Practice Providers on your designated Care Team:   Christopher Berge, NP Ryan Dunn, PA-C Jacquelyn Visser, PA-C Cadence Furth, PA-C  

## 2020-11-12 NOTE — Telephone Encounter (Signed)
Cardiology Clearance received -See 11/12/20 OV note per Dr. Saunders Revel   [facility] PAT notified.  Routing to Dr. Antony Blackbird  Cc: Angie Fava Carder

## 2020-11-12 NOTE — Telephone Encounter (Signed)
Thank you for the update.  I have reviewed the note from Dr. Saunders Revel who has cleared the patient for surgery from a cardiology standpoint.

## 2020-11-13 ENCOUNTER — Other Ambulatory Visit: Payer: Self-pay

## 2020-11-13 ENCOUNTER — Encounter (HOSPITAL_BASED_OUTPATIENT_CLINIC_OR_DEPARTMENT_OTHER): Payer: Self-pay | Admitting: Obstetrics and Gynecology

## 2020-11-13 NOTE — Telephone Encounter (Signed)
Lovenox, Return 9/26, BF

## 2020-11-13 NOTE — Telephone Encounter (Signed)
Encounter closed

## 2020-11-13 NOTE — Progress Notes (Signed)
Spoke w/ via phone for pre-op interview--- pt Lab needs dos----   [employer] results------ current ekg in epic// chart COVID test -----patient states asymptomatic no test needed Arrive at -------  0530 on 11-17-2020 NPO after MN NO Solid Food.  Clear liquids from MN until--- 0430 Med rec completed Medications to take morning of surgery ----- entresto, coreg, protonix, symbicort inhaler Diabetic medication ----- n/a Patient instructed no nail polish to be worn day of surgery Patient instructed to bring photo id and insurance card day of surgery Patient aware to have Driver (ride ) / caregiver  for 24 hours after surgery --husband, [place] Patient Special Instructions ----- n/a Pre-Op special Istructions ----- pt has cardiac office note clearance by dr end on 11-12-2020, in epic/ chart and pt has coumadin telephone clearance by angela duke pa dated 11-06-2020 in epic/ chart Patient verbalized understanding of instructions that were given at this phone interview. Patient denies shortness of breath, chest pain, fever, cough at this phone interview.    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   Pt denies cardiac symptoms but does have DOE stated better after starting entresto and no change of status since cardiac visit yesterday.  PCP:  Dr B. [name] Cardiologist : Dr End Cassell Clement 11-12-2020 epic) Pulmonology:  Dr Melvyn Novas (lov 03-14-2016 epic) Chest x-ray : 04-03-2020 epic EKG : 11-12-2020 epic Echo : 11-21-2019 epic Stress test: no Cardiac Cath :  03-10-2020 epic Activity level: see above Sleep Study/ CPAP : no Blood Thinner/ Instructions Maryjane Hurter Dose: Coumadin ASA / Instructions/  Last Dose :  no Per pt was given instructions for coumadin and bridging with lovenox by cardiology,  stated last dose coumadin 11-11-2020 and started lovenox 11-12-2020.

## 2020-11-16 ENCOUNTER — Encounter (HOSPITAL_BASED_OUTPATIENT_CLINIC_OR_DEPARTMENT_OTHER): Payer: Self-pay | Admitting: Obstetrics and Gynecology

## 2020-11-16 NOTE — Anesthesia Preprocedure Evaluation (Addendum)
Anesthesia Evaluation  Patient identified by MRN, date of birth, ID band Patient awake    Reviewed: Allergy & Precautions, NPO status , Patient's Chart, lab work & pertinent test results, reviewed documented beta blocker date and time   Airway Mallampati: II  TM Distance: >3 FB Neck ROM: Full    Dental  (+) Upper Dentures   Pulmonary asthma , pneumonia, resolved, COPD,  COPD inhaler, former smoker,    Pulmonary exam normal breath sounds clear to auscultation       Cardiovascular Exercise Tolerance: Poor hypertension, Pt. on medications + CAD, +CHF and + DOE  Normal cardiovascular exam+ dysrhythmias Atrial Fibrillation  Rhythm:Regular Rate:Normal  S/P bioprosthetic AVR 03/2014  EKG 11/12/2020 NSR, PVC's, inferior infarct, poor R wave progression  Echo 11/21/19 1. Porcine AVR, Procedure Date: 2015.  2. Left ventricular ejection fraction, by estimation, is 45 to 50%. The left ventricle has mildly decreased function. The left ventricle demonstrates global hypokinesis. There is mild left ventricular hypertrophy. Left ventricular diastolic function could not be evaluated.  3. Right ventricular systolic function is normal. The right ventricular size is normal.  4. The mitral valve is grossly normal. No evidence of mitral valve regurgitation.   Hx/o Atrial fibrillation post op AVR currently in NSR   Neuro/Psych Anxiety Hx/o CVAx 2 post AVR 03/2014 residual right hand weakness  Neuromuscular disease CVA, Residual Symptoms    GI/Hepatic Neg liver ROS, GERD  Medicated and Controlled,  Endo/Other  Hyperthyroidism Hx/o multiple thyroid nodules Hyperlipidemia Osteoporosis Hx/o left breast Ca S/P surgery and RT  Renal/GU Renal InsufficiencyRenal disease  negative genitourinary   Musculoskeletal  (+) Arthritis , Osteoarthritis,    Abdominal (+) + obese,   Peds  Hematology  (+) anemia , Hx/o thrombocytopenia Chronic  anticoagulation Coumadin therapy with Lovenox bridge- last lovenox yesterday pm   Anesthesia Other Findings   Reproductive/Obstetrics Post menopausal bleeding                          Anesthesia Physical Anesthesia Plan  ASA: 3  Anesthesia Plan: General   Post-op Pain Management:    Induction: Intravenous  PONV Risk Score and Plan: 4 or greater and Treatment may vary due to age or medical condition and Ondansetron  Airway Management Planned: LMA  Additional Equipment:   Intra-op Plan:   Post-operative Plan: Extubation in OR  Informed Consent: I have reviewed the patients History and Physical, chart, labs and discussed the procedure including the risks, benefits and alternatives for the proposed anesthesia with the patient or authorized representative who has indicated his/her understanding and acceptance.     Dental advisory given  Plan Discussed with: CRNA and Anesthesiologist  Anesthesia Plan Comments:        Anesthesia Quick Evaluation

## 2020-11-17 ENCOUNTER — Ambulatory Visit (HOSPITAL_BASED_OUTPATIENT_CLINIC_OR_DEPARTMENT_OTHER): Payer: Medicare Other | Admitting: Anesthesiology

## 2020-11-17 ENCOUNTER — Other Ambulatory Visit: Payer: Self-pay

## 2020-11-17 ENCOUNTER — Encounter (HOSPITAL_BASED_OUTPATIENT_CLINIC_OR_DEPARTMENT_OTHER): Payer: Self-pay | Admitting: Obstetrics and Gynecology

## 2020-11-17 ENCOUNTER — Ambulatory Visit (HOSPITAL_COMMUNITY)
Admission: RE | Admit: 2020-11-17 | Discharge: 2020-11-17 | Disposition: A | Discharge status: Home / Self Care | Payer: Medicare Other | Source: Ambulatory Visit | Attending: Obstetrics and Gynecology | Admitting: Obstetrics and Gynecology

## 2020-11-17 ENCOUNTER — Ambulatory Visit (HOSPITAL_BASED_OUTPATIENT_CLINIC_OR_DEPARTMENT_OTHER)
Admission: RE | Admit: 2020-11-17 | Discharge: 2020-11-17 | Disposition: A | Discharge status: Home / Self Care | Payer: Medicare Other | Attending: Obstetrics and Gynecology | Admitting: Obstetrics and Gynecology

## 2020-11-17 ENCOUNTER — Telehealth: Payer: Self-pay | Admitting: Family Medicine

## 2020-11-17 ENCOUNTER — Encounter (HOSPITAL_BASED_OUTPATIENT_CLINIC_OR_DEPARTMENT_OTHER)
Admission: RE | Disposition: A | Discharge status: Home / Self Care | Payer: Self-pay | Source: Home / Self Care | Attending: Obstetrics and Gynecology

## 2020-11-17 DIAGNOSIS — N950 Postmenopausal bleeding: Secondary | ICD-10-CM | POA: Insufficient documentation

## 2020-11-17 DIAGNOSIS — N183 Chronic kidney disease, stage 3 unspecified: Secondary | ICD-10-CM | POA: Diagnosis not present

## 2020-11-17 DIAGNOSIS — I130 Hypertensive heart and chronic kidney disease with heart failure and stage 1 through stage 4 chronic kidney disease, or unspecified chronic kidney disease: Secondary | ICD-10-CM | POA: Diagnosis not present

## 2020-11-17 DIAGNOSIS — C539 Malignant neoplasm of cervix uteri, unspecified: Secondary | ICD-10-CM

## 2020-11-17 DIAGNOSIS — Z8616 Personal history of COVID-19: Secondary | ICD-10-CM | POA: Diagnosis not present

## 2020-11-17 DIAGNOSIS — Z7983 Long term (current) use of bisphosphonates: Secondary | ICD-10-CM | POA: Insufficient documentation

## 2020-11-17 DIAGNOSIS — Z887 Allergy status to serum and vaccine status: Secondary | ICD-10-CM | POA: Diagnosis not present

## 2020-11-17 DIAGNOSIS — I480 Paroxysmal atrial fibrillation: Secondary | ICD-10-CM | POA: Diagnosis not present

## 2020-11-17 DIAGNOSIS — J449 Chronic obstructive pulmonary disease, unspecified: Secondary | ICD-10-CM | POA: Insufficient documentation

## 2020-11-17 DIAGNOSIS — C50412 Malignant neoplasm of upper-outer quadrant of left female breast: Secondary | ICD-10-CM | POA: Diagnosis not present

## 2020-11-17 DIAGNOSIS — Z888 Allergy status to other drugs, medicaments and biological substances status: Secondary | ICD-10-CM | POA: Insufficient documentation

## 2020-11-17 DIAGNOSIS — Z952 Presence of prosthetic heart valve: Secondary | ICD-10-CM | POA: Insufficient documentation

## 2020-11-17 DIAGNOSIS — I5022 Chronic systolic (congestive) heart failure: Secondary | ICD-10-CM | POA: Diagnosis not present

## 2020-11-17 DIAGNOSIS — R9389 Abnormal findings on diagnostic imaging of other specified body structures: Secondary | ICD-10-CM | POA: Diagnosis present

## 2020-11-17 DIAGNOSIS — Z7984 Long term (current) use of oral hypoglycemic drugs: Secondary | ICD-10-CM | POA: Diagnosis not present

## 2020-11-17 DIAGNOSIS — N840 Polyp of corpus uteri: Secondary | ICD-10-CM | POA: Insufficient documentation

## 2020-11-17 DIAGNOSIS — A6000 Herpesviral infection of urogenital system, unspecified: Secondary | ICD-10-CM | POA: Diagnosis not present

## 2020-11-17 DIAGNOSIS — N882 Stricture and stenosis of cervix uteri: Secondary | ICD-10-CM | POA: Diagnosis not present

## 2020-11-17 DIAGNOSIS — E782 Mixed hyperlipidemia: Secondary | ICD-10-CM | POA: Diagnosis not present

## 2020-11-17 DIAGNOSIS — Z87891 Personal history of nicotine dependence: Secondary | ICD-10-CM | POA: Insufficient documentation

## 2020-11-17 DIAGNOSIS — Z79810 Long term (current) use of selective estrogen receptor modulators (SERMs): Secondary | ICD-10-CM | POA: Insufficient documentation

## 2020-11-17 DIAGNOSIS — D696 Thrombocytopenia, unspecified: Secondary | ICD-10-CM | POA: Diagnosis not present

## 2020-11-17 HISTORY — DX: Unspecified sequelae of cerebral infarction: I69.30

## 2020-11-17 HISTORY — PX: HYSTEROSCOPY WITH D & C: SHX1775

## 2020-11-17 HISTORY — DX: Other constipation: K59.09

## 2020-11-17 HISTORY — DX: Personal history of other diseases of the digestive system: Z87.19

## 2020-11-17 HISTORY — DX: Chronic kidney disease, stage 3 unspecified: N18.30

## 2020-11-17 HISTORY — DX: Thrombocytopenia, unspecified: D69.6

## 2020-11-17 HISTORY — DX: Gastro-esophageal reflux disease without esophagitis: K21.9

## 2020-11-17 HISTORY — DX: Unspecified osteoarthritis, unspecified site: M19.90

## 2020-11-17 HISTORY — DX: Other forms of dyspnea: R06.09

## 2020-11-17 HISTORY — DX: Long term (current) use of anticoagulants: Z79.01

## 2020-11-17 HISTORY — DX: Dyspnea, unspecified: R06.00

## 2020-11-17 HISTORY — DX: Presence of dental prosthetic device (complete) (partial): Z97.2

## 2020-11-17 HISTORY — DX: Personal history of other diseases of the respiratory system: Z87.09

## 2020-11-17 HISTORY — PX: OPERATIVE ULTRASOUND: SHX5996

## 2020-11-17 LAB — POCT I-STAT, CHEM 8
BUN: 27 mg/dL — ABNORMAL HIGH (ref 8–23)
Calcium, Ion: 1.24 mmol/L (ref 1.15–1.40)
Chloride: 107 mmol/L (ref 98–111)
Creatinine, Ser: 1.6 mg/dL — ABNORMAL HIGH (ref 0.44–1.00)
Glucose, Bld: 100 mg/dL — ABNORMAL HIGH (ref 70–99)
HCT: 38 % (ref 36.0–46.0)
Hemoglobin: 12.9 g/dL (ref 12.0–15.0)
Potassium: 5.3 mmol/L — ABNORMAL HIGH (ref 3.5–5.1)
Sodium: 142 mmol/L (ref 135–145)
TCO2: 27 mmol/L (ref 22–32)

## 2020-11-17 LAB — TYPE AND SCREEN
ABO/RH(D): O POS
Antibody Screen: NEGATIVE

## 2020-11-17 LAB — CBC
HCT: 37.9 % (ref 36.0–46.0)
Hemoglobin: 12.1 g/dL (ref 12.0–15.0)
MCH: 31.7 pg (ref 26.0–34.0)
MCHC: 31.9 g/dL (ref 30.0–36.0)
MCV: 99.2 fL (ref 80.0–100.0)
Platelets: 84 10*3/uL — ABNORMAL LOW (ref 150–400)
RBC: 3.82 MIL/uL — ABNORMAL LOW (ref 3.87–5.11)
RDW: 14.8 % (ref 11.5–15.5)
WBC: 5.4 10*3/uL (ref 4.0–10.5)
nRBC: 0 % (ref 0.0–0.2)

## 2020-11-17 SURGERY — DILATATION AND CURETTAGE /HYSTEROSCOPY
Anesthesia: General | Site: Uterus

## 2020-11-17 MED ORDER — FENTANYL CITRATE (PF) 100 MCG/2ML IJ SOLN
INTRAMUSCULAR | Status: DC | PRN
  Administered 2020-11-17: 25 ug via INTRAVENOUS
  Administered 2020-11-17: 50 ug via INTRAVENOUS
  Administered 2020-11-17: 25 ug via INTRAVENOUS

## 2020-11-17 MED ORDER — GLYCOPYRROLATE 0.2 MG/ML IJ SOLN
INTRAMUSCULAR | Status: DC | PRN
  Administered 2020-11-17 (×2): .1 mg via INTRAVENOUS

## 2020-11-17 MED ORDER — CEFAZOLIN SODIUM-DEXTROSE 2-3 GM-%(50ML) IV SOLR
INTRAVENOUS | Status: DC | PRN
  Administered 2020-11-17: 2 g via INTRAVENOUS

## 2020-11-17 MED ORDER — POVIDONE-IODINE 10 % EX SWAB: 2.0000 "application " | Freq: Once | CUTANEOUS | Status: DC

## 2020-11-17 MED ORDER — PHENYLEPHRINE HCL (PRESSORS) 10 MG/ML IV SOLN
INTRAVENOUS | Status: AC
  Filled 2020-11-17: qty 2

## 2020-11-17 MED ORDER — PROPOFOL 10 MG/ML IV BOLUS
INTRAVENOUS | Status: DC | PRN
  Administered 2020-11-17: 120 mg via INTRAVENOUS

## 2020-11-17 MED ORDER — SODIUM CHLORIDE 0.9 % IR SOLN
Status: DC | PRN
  Administered 2020-11-17: 400 mL via INTRAVESICAL

## 2020-11-17 MED ORDER — MIDAZOLAM HCL 5 MG/5ML IJ SOLN
INTRAMUSCULAR | Status: DC | PRN
  Administered 2020-11-17: 1 mg via INTRAVENOUS

## 2020-11-17 MED ORDER — FENTANYL CITRATE (PF) 100 MCG/2ML IJ SOLN: 25.0000 ug | INTRAMUSCULAR | Status: DC | PRN

## 2020-11-17 MED ORDER — EPHEDRINE SULFATE-NACL 50-0.9 MG/10ML-% IV SOSY
PREFILLED_SYRINGE | INTRAVENOUS | Status: DC | PRN
  Administered 2020-11-17 (×2): 5 mg via INTRAVENOUS

## 2020-11-17 MED ORDER — LIDOCAINE 2% (20 MG/ML) 5 ML SYRINGE
INTRAMUSCULAR | Status: DC | PRN
  Administered 2020-11-17: 50 mg via INTRAVENOUS

## 2020-11-17 MED ORDER — DEXAMETHASONE SODIUM PHOSPHATE 10 MG/ML IJ SOLN
INTRAMUSCULAR | Status: AC
  Filled 2020-11-17: qty 1

## 2020-11-17 MED ORDER — CEFAZOLIN SODIUM-DEXTROSE 2-4 GM/100ML-% IV SOLN
INTRAVENOUS | Status: AC
  Filled 2020-11-17: qty 100

## 2020-11-17 MED ORDER — OXYCODONE HCL 5 MG PO TABS: 5.0000 mg | ORAL_TABLET | Freq: Once | ORAL | Status: DC | PRN

## 2020-11-17 MED ORDER — LIDOCAINE HCL 1 % IJ SOLN
INTRAMUSCULAR | Status: DC | PRN
  Administered 2020-11-17: 10 mL

## 2020-11-17 MED ORDER — OXYCODONE HCL 5 MG/5ML PO SOLN: 5.0000 mg | Freq: Once | ORAL | Status: DC | PRN

## 2020-11-17 MED ORDER — GLYCOPYRROLATE PF 0.2 MG/ML IJ SOSY
PREFILLED_SYRINGE | INTRAMUSCULAR | Status: AC
  Filled 2020-11-17: qty 1

## 2020-11-17 MED ORDER — LACTATED RINGERS IV SOLN: INTRAVENOUS | Status: DC

## 2020-11-17 MED ORDER — ONDANSETRON HCL 4 MG/2ML IJ SOLN
INTRAMUSCULAR | Status: AC
  Filled 2020-11-17: qty 2

## 2020-11-17 MED ORDER — PHENYLEPHRINE 40 MCG/ML (10ML) SYRINGE FOR IV PUSH (FOR BLOOD PRESSURE SUPPORT)
PREFILLED_SYRINGE | INTRAVENOUS | Status: DC | PRN
  Administered 2020-11-17: 120 ug via INTRAVENOUS
  Administered 2020-11-17: 80 ug via INTRAVENOUS
  Administered 2020-11-17: 40 ug via INTRAVENOUS
  Administered 2020-11-17 (×2): 80 ug via INTRAVENOUS
  Administered 2020-11-17: 120 ug via INTRAVENOUS
  Administered 2020-11-17: 80 ug via INTRAVENOUS
  Administered 2020-11-17: 40 ug via INTRAVENOUS
  Administered 2020-11-17 (×2): 80 ug via INTRAVENOUS

## 2020-11-17 MED ORDER — PHENYLEPHRINE HCL-NACL 20-0.9 MG/250ML-% IV SOLN
INTRAVENOUS | Status: DC | PRN
  Administered 2020-11-17: 40 ug/min via INTRAVENOUS

## 2020-11-17 MED ORDER — FENTANYL CITRATE (PF) 100 MCG/2ML IJ SOLN
INTRAMUSCULAR | Status: AC
  Filled 2020-11-17: qty 2

## 2020-11-17 MED ORDER — DEXAMETHASONE SODIUM PHOSPHATE 10 MG/ML IJ SOLN
INTRAMUSCULAR | Status: DC | PRN
  Administered 2020-11-17: 10 mg via INTRAVENOUS

## 2020-11-17 MED ORDER — SODIUM CHLORIDE 0.9 % IV SOLN: INTRAVENOUS | Status: DC

## 2020-11-17 MED ORDER — ONDANSETRON HCL 4 MG/2ML IJ SOLN
INTRAMUSCULAR | Status: DC | PRN
  Administered 2020-11-17: 4 mg via INTRAVENOUS

## 2020-11-17 MED ORDER — LIDOCAINE HCL (PF) 2 % IJ SOLN
INTRAMUSCULAR | Status: AC
  Filled 2020-11-17: qty 5

## 2020-11-17 MED ORDER — BUDESONIDE-FORMOTEROL FUMARATE 160-4.5 MCG/ACT IN AERO: 2.0000 | INHALATION_SPRAY | Freq: Every day | RESPIRATORY_TRACT | 3 refills | Status: DC | PRN

## 2020-11-17 MED ORDER — MIDAZOLAM HCL 2 MG/2ML IJ SOLN
INTRAMUSCULAR | Status: AC
  Filled 2020-11-17: qty 2

## 2020-11-17 MED ORDER — PROPOFOL 10 MG/ML IV BOLUS
INTRAVENOUS | Status: AC
  Filled 2020-11-17: qty 40

## 2020-11-17 SURGICAL SUPPLY — 21 items
CATH ROBINSON RED A/P 16FR (CATHETERS) ×4 IMPLANT
DEVICE MYOSURE LITE (MISCELLANEOUS) IMPLANT
DEVICE MYOSURE REACH (MISCELLANEOUS) ×2 IMPLANT
DILATOR CANAL MILEX (MISCELLANEOUS) ×2 IMPLANT
DRSG TELFA 3X8 NADH (GAUZE/BANDAGES/DRESSINGS) ×2 IMPLANT
GAUZE 4X4 16PLY ~~LOC~~+RFID DBL (SPONGE) ×2 IMPLANT
GLOVE SURG ENC MOIS LTX SZ6.5 (GLOVE) ×2 IMPLANT
GOWN STRL REUS W/TWL LRG LVL3 (GOWN DISPOSABLE) ×2 IMPLANT
IV NS IRRIG 3000ML ARTHROMATIC (IV SOLUTION) ×2 IMPLANT
KIT PROCEDURE FLUENT (KITS) ×2 IMPLANT
KIT TURNOVER CYSTO (KITS) ×2 IMPLANT
MYOSURE XL FIBROID (MISCELLANEOUS)
PACK VAGINAL MINOR WOMEN LF (CUSTOM PROCEDURE TRAY) ×2 IMPLANT
PAD OB MATERNITY 4.3X12.25 (PERSONAL CARE ITEMS) ×2 IMPLANT
SEAL CERVICAL OMNI LOK (ABLATOR) IMPLANT
SEAL ROD LENS SCOPE MYOSURE (ABLATOR) ×2 IMPLANT
SUT VIC AB 2-0 CT1 27 (SUTURE) ×2
SUT VIC AB 2-0 CT1 TAPERPNT 27 (SUTURE) ×1 IMPLANT
SYR BULB IRRIG 60ML STRL (SYRINGE) ×2 IMPLANT
SYSTEM TISS REMOVAL MYOSURE XL (MISCELLANEOUS) IMPLANT
TOWEL OR 17X26 10 PK STRL BLUE (TOWEL DISPOSABLE) ×2 IMPLANT

## 2020-11-17 NOTE — Telephone Encounter (Signed)
Patient called saying that she is needing a medication prescribed for sleep. She says that she is sleeping just a few hours at night and melatonin is not working.  Patient's contact number is [phone].  Please advise.

## 2020-11-17 NOTE — Anesthesia Procedure Notes (Signed)
Procedure Name: LMA Insertion Date/Time: 11/17/2020 7:44 AM Performed by: Gwyndolyn Saxon, CRNA Pre-anesthesia Checklist: Patient identified, Emergency Drugs available, Suction available and Patient being monitored Patient Re-evaluated:Patient Re-evaluated prior to induction Oxygen Delivery Method: Circle system utilized Preoxygenation: Pre-oxygenation with 100% oxygen Induction Type: IV induction Ventilation: Mask ventilation without difficulty LMA: LMA with gastric port inserted LMA Size: 4.0 Number of attempts: 1 Placement Confirmation: positive ETCO2 and breath sounds checked- equal and bilateral Dental Injury: Teeth and Oropharynx as per pre-operative assessment

## 2020-11-17 NOTE — Discharge Instructions (Addendum)
Hello [name],   Your surgery went well today!   Your had several area of cystic formation inside the uterine cavity.  I was able to remove these and send them for biopsy. I also did the curettage of all of the endometrial surfaces.   I put a small suture in your cervix where you had some bleeding, and this will dissolve.  Please follow your coumadin clinic's recommendations for your coumadin and Lovenox.   You may take Tylenol for pain.   I will see you in 2 weeks.   Josefa Half, MD  DISCHARGE INSTRUCTIONS: D&C / D&E The following instructions have been prepared to help you care for yourself upon your return home.   Personal hygiene:  Use sanitary pads for vaginal drainage, not tampons.  Shower the day after your procedure.  NO tub baths, pools or Jacuzzis for 2-3 weeks.  Wipe front to back after using the bathroom.  Activity and limitations:  Do NOT drive or operate any equipment for 24 hours. The effects of anesthesia are still present and drowsiness may result.  Do NOT rest in bed all day.  Walking is encouraged.  Walk up and down stairs slowly.  You may resume your normal activity in one to two days or as indicated by your physician.  Sexual activity: NO intercourse for at least 2 weeks after the procedure, or as indicated by your physician.  Diet: Eat a light meal as desired this evening. You may resume your usual diet tomorrow.  Return to work: You may resume your work activities in one to two days or as indicated by your doctor.  What to expect after your surgery: Expect to have vaginal bleeding/discharge for 2-3 days and spotting for up to 10 days. It is not unusual to have soreness for up to 1-2 weeks. You may have a slight burning sensation when you urinate for the first day. Mild cramps may continue for a couple of days. You may have a regular period in 2-6 weeks.  Call your doctor for any of the following:  Excessive vaginal bleeding, saturating  and changing one pad every hour.  Inability to urinate 6 hours after discharge from hospital.  Pain not relieved by pain medication.  Fever of 100.4 F or greater.  Unusual vaginal discharge or odor.   Post Anesthesia Home Care Instructions  Activity: Get plenty of rest for the remainder of the day. A responsible individual must stay with you for 24 hours following the procedure.  For the next 24 hours, DO NOT: -Drive a car -Paediatric nurse -Drink alcoholic beverages -Take any medication unless instructed by your physician -Make any legal decisions or sign important papers.  Meals: Start with liquid foods such as gelatin or soup. Progress to regular foods as tolerated. Avoid greasy, spicy, heavy foods. If nausea and/or vomiting occur, drink only clear liquids until the nausea and/or vomiting subsides. Call your physician if vomiting continues.  Special Instructions/Symptoms: Your throat may feel dry or sore from the anesthesia or the breathing tube placed in your throat during surgery. If this causes discomfort, gargle with warm salt water. The discomfort should disappear within 24 hours.

## 2020-11-17 NOTE — H&P (Signed)
Office Visit 11/10/2020 [facility], MD Obstetrics and Gynecology Postmenopausal bleeding +1 more Dx Referred by [name], [name], MD Reason for Visit   Additional Documentation  Vitals:  BP 117/72 (BP Location: Right Arm, Patient Position: Sitting) Pulse 80 Resp 20  Flowsheets:  NEWS, MEWS Score, Method of Visit   Encounter Info:  Billing Info, History, Allergies, Detailed Report    All Notes    Progress Notes by Nunzio Cobbs, MD at 11/10/2020 2:15 PM  Author: Nunzio Cobbs, MD Author Type: Physician Filed: 11/12/2020  8:01 PM  Note Status: Signed Cosign: Cosign Not Required Encounter Date: 11/10/2020  Editor: Nunzio Cobbs, MD (Physician)      Prior Versions: 1. Burnice Logan, RN (Registered Nurse) at 11/10/2020  2:36 PM - Sign when Signing Visit    GYNECOLOGY  VISIT   HPI: 75 y.o.   Married  [place] American  female   [id number] with No LMP recorded. Patient is postmenopausal.   here for pre-op. Surgery scheduled 11/17/20.  She is scheduled for ultrasound guided hysteroscopy with dilation and curettage and Myosure resection of endometrial thickening.    Patient has postmenopausal bleeding and thickened endometrium measuring 11.79 mm and fluid in the endometrial canal on pelvic ultrasound while taking Tamoxifen.  Her endometrial biopsy shows benign endometrium.  She had evidence of cervical stenosis at the time of her biopsy which is thought to not represent the status of her endometrium.   Patient does have congestive heart failure and atrial fibrillation.  Has appt with cardiology on 11/12/20, Dr. Saunders Revel.  She does take coumadin and has met with her coumadin clinic to receive instructions for discontinuation of coumadin and initiation of a Lovenox bridge in preparation for upcoming surgery.    She has also had consultation with oncology regarding her Tamoxifen therapy and low platelets.  Her  Tamoxifen was discontinued.  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  Her last platelet level measured 91,000 on 10/29/20.    Patient reports she is feeling stressed and is having herpes outbreak.  She usually takes Valtrex 500 mg po bid x 3 days. She has not taken this for a long time.    She does have chronically elevated creatinine.  Creatinine 1.78 on 9//12/22.  Creatinine has fluctuated from 1.18 to 1.78 since July, 2022.    GYNECOLOGIC HISTORY: No LMP recorded. Patient is postmenopausal. Contraception:  PMP Menopausal hormone therapy:  N/A Last mammogram:  01-16-20  Diag.Bil./status post Lt.lumpectomy 2018/Neg/BiRads2 Last pap smear:   08-20-20 Neg:Neg HR HPV. Years ago normal always        OB History       Gravida  0   Para  0   Term  0   Preterm  0   AB  0   Living  0        SAB  0   IAB  0   Ectopic  0   Multiple  0   Live Births  0                     Patient Active Problem List    Diagnosis Date Noted   Gait abnormality 07/31/2020   Paresthesia 07/31/2020   Low back pain with bilateral sciatica 07/31/2020   Pneumonia due to COVID-19 virus     Bacteremia due to Gram-positive bacteria 08/29/2018   Abscess  Cellulitis of left breast 08/28/2018   Cellulitis of left abdominal wall 08/28/2018   Encounter for routine gynecological examination 04/28/2018   Stage 3 chronic kidney disease ([place]) 09/27/2017   Recurrent genital herpes 04/13/2017   Chronic pain disorder 01/25/2017   Malignant neoplasm of upper-outer quadrant of left breast in female, estrogen receptor positive ([place]) 12/16/2016   Chronic knee pain 12/07/2016   Chronic bilateral low back pain without sciatica 12/07/2016   Peripheral musculoskeletal gait disorder 12/07/2016   Encounter for therapeutic drug monitoring 06/16/2016   Generalized osteoarthritis of multiple sites 05/04/2016   Chronic anticoagulation 05/04/2016   Stroke ([place]) 04/14/2016   Aortic valve disease  04/10/2016   S/P AVR 04/10/2016   NICM (nonischemic cardiomyopathy) ([place]) 04/10/2016   Upper airway cough syndrome 03/14/2016   Morbid obesity ([place]) 03/14/2016   COPD (chronic obstructive pulmonary disease) ([place]) 03/11/2016   Thrombocytopenia ([place]) 12/12/2015   Anemia 12/12/2015   Vitamin D deficiency 12/12/2015   Acute urinary retention 12/11/2015   Osteoporosis 12/11/2015   Hip fracture ([place]) 12/10/2015   Aortic atherosclerosis ([place]) 12/10/2015   Lung blebs ([place]) 12/03/2015   Pelvic fracture ([place]) 08/19/2015   Fall at home 08/19/2015   Essential hypertension 08/19/2015   Mixed hyperlipidemia 08/19/2015   Asthma 08/19/2015   Paroxysmal atrial fibrillation ([place]) 08/19/2015   Hyperthyroidism 08/19/2015   Chronic HFrEF (heart failure with reduced ejection fraction) ([place])     Coronary artery disease     Aortic stenosis            Past Medical History:  Diagnosis Date   Allergy     Anxiety     Aortic stenosis      Status post bioprosthetic AVR   Arthritis      DJD   Asthma     Breast cancer ([place]) 12/13/2016    Left breast   Chronic systolic CHF (congestive heart failure) ([place])      EF 30% 04/2014   COPD (chronic obstructive pulmonary disease) ([place])     Coronary artery disease      per pt, had LHC prior to AVR in [place] 2016 that did not show any blockages; no stents/bypass   Gastritis     Hyperlipidemia     Hypertension     Hyperthyroidism     Multiple thyroid nodules     Osteoporosis     Paroxysmal atrial fibrillation ([place])     Pelvis fracture ([place]) 08/19/2015    MULTIPLE    Personal history of radiation therapy 2018   Spontaneous pneumothorax 11/28/2015    left    Stroke ([place]) 2000    rt hand weak           Past Surgical History:  Procedure Laterality Date   BREAST LUMPECTOMY Left 12/2016   BREAST LUMPECTOMY WITH RADIOACTIVE SEED AND SENTINEL LYMPH NODE BIOPSY Left 01/14/2017    Procedure: LEFT BREAST LUMPECTOMY WITH RADIOACTIVE SEED AND SENTINEL LYMPH  NODE BIOPSY;  Surgeon: Rolm Bookbinder, MD;  Location: [facility];  Service: General;  Laterality: Left;   CARDIAC CATHETERIZATION       CHEST TUBE INSERTION   10/2015   INTRAMEDULLARY (IM) NAIL INTERTROCHANTERIC Left 12/10/2015    Procedure: INTRAMEDULLARY (IM) NAIL INTERTROCHANTRIC;  Surgeon: Leandrew Koyanagi, MD;  Location: [facility];  Service: Orthopedics;  Laterality: Left;   PLEURADESIS Left 12/03/2015    Procedure: PLEURADESIS;  Surgeon: Melrose Nakayama, MD;  Location: [facility];  Service: Thoracic;  Laterality: Left;   RESECTION OF APICAL  BLEB Left 12/03/2015    Procedure: BLEBECTOMY;  Surgeon: Melrose Nakayama, MD;  Location: [name];  Service: Thoracic;  Laterality: Left;   RIGHT/LEFT HEART CATH AND CORONARY ANGIOGRAPHY N/A 03/10/2020    Procedure: RIGHT/LEFT HEART CATH AND CORONARY ANGIOGRAPHY;  Surgeon: [name], MD;  Location: [facility];  Service: Cardiovascular;  Laterality: N/A;   THORACOSCOPY   12/03/2015   VALVE REPLACEMENT       VIDEO ASSISTED THORACOSCOPY Left 12/03/2015    Procedure: VIDEO ASSISTED THORACOSCOPY;  Surgeon: Melrose Nakayama, MD;  Location: [facility];  Service: Thoracic;  Laterality: Left;            Current Outpatient Medications  Medication Sig Dispense Refill   albuterol (PROVENTIL HFA;VENTOLIN HFA) 108 (90 Base) MCG/ACT inhaler Inhale 2 puffs into the lungs every 6 (six) hours as needed for wheezing or shortness of breath.        alendronate (FOSAMAX) 70 MG tablet Take 1 tablet (70 mg total) by mouth once a week. Take with a full glass of water on an empty stomach. 13 tablet 3   ascorbic acid (VITAMIN C) 500 MG tablet Take 500 mg by mouth daily.       budesonide-formoterol (SYMBICORT) 160-4.5 MCG/ACT inhaler Inhale 2 puffs into the lungs daily as needed.       carvedilol (COREG) 12.5 MG tablet Take 1 tablet (12.5 mg total) by mouth 2 (two) times daily with a meal. 180 tablet 0   Cholecalciferol 25 MCG (1000 UT) tablet Take 1,000 Units by mouth  daily.       dapagliflozin propanediol (FARXIGA) 10 MG TABS tablet Take 1 tablet (10 mg total) by mouth daily before breakfast. 30 tablet 3   diphenhydramine-acetaminophen (TYLENOL PM) 25-500 MG TABS tablet Take 2 tablets by mouth at bedtime.       enoxaparin (LOVENOX) 120 MG/0.8ML injection Inject 0.8 mLs (120 mg total) into the skin every 12 (twelve) hours. 15.2 mL 0   fluticasone (FLONASE) 50 MCG/ACT nasal spray Place 1 spray into both nostrils daily. 16 g 3   methimazole (TAPAZOLE) 5 MG tablet Take 3 tablets (15 mg total) by mouth daily. 270 tablet 2   pantoprazole (PROTONIX) 40 MG tablet Take 1 tablet (40 mg total) by mouth daily. 30 tablet 11   rosuvastatin (CRESTOR) 40 MG tablet TAKE 1 TABLET BY MOUTH  DAILY 90 tablet 3   sacubitril-valsartan (ENTRESTO) 49-51 MG Take 1 tablet by mouth 2 (two) times daily.       senna-docusate (SENOKOT-S) 8.6-50 MG tablet Take 2 tablets at bedtime by mouth.        spironolactone (ALDACTONE) 25 MG tablet Take 0.5 tablets (12.5 mg total) by mouth daily. 45 tablet 3   tamoxifen (NOLVADEX) 20 MG tablet Take 1 tablet (20 mg total) by mouth daily. 90 tablet 3   torsemide (DEMADEX) 20 MG tablet Take 1 tablet (20 mg total) by mouth daily. 90 tablet 3   traMADol (ULTRAM) 50 MG tablet Take 1 tablet (50 mg total) by mouth 3 (three) times daily. 90 tablet 5   trolamine salicylate (ASPERCREME) 10 % cream Apply 1 application topically as needed for muscle pain.       valACYclovir (VALTREX) 500 MG tablet Take on tablet by mouth twice a day for 3 days as needed for an outbreak 30 tablet 0   warfarin (COUMADIN) 3 MG tablet Take 1 tablet daily or Take as directed by anticoagulation clinic (Patient not taking: Reported on 11/12/2020)  90 tablet 1    No current facility-administered medications for this visit.      ALLERGIES: Lisinopril, Tetanus toxoid adsorbed, and Other        Family History  Problem Relation Age of Onset   Diabetes Mother     Heart attack Mother 97    Diabetes Father     Lung cancer Father     Diabetes Sister     Thyroid disease Sister     Diabetes Sister     HIV Brother        Social History         Socioeconomic History   Marital status: Married      Spouse name: [name]   Number of children: 0   Years of education: 12   Highest education level: 12th grade  Occupational History   Occupation: Retired in 2004  Tobacco Use   Smoking status: Former      Packs/day: 0.25      Years: 10.00      Pack years: 2.50      Types: Cigarettes      Quit date: 03/01/2010      Years since quitting: 10.7   Smokeless tobacco: Never  Vaping Use   Vaping Use: Never used  Substance and Sexual Activity   Alcohol use: No   Drug use: No   Sexual activity: Yes      Birth control/protection: Post-menopausal  Other Topics Concern   Not on file  Social History Narrative    Lives with husband.  Ambulated independently.    Right-handed.    No daily caffeine use.    Social Determinants of Health       Financial Resource Strain: Medium Risk   Difficulty of Paying Living Expenses: Somewhat hard  Food Insecurity: No Food Insecurity   Worried About Charity fundraiser in the Last Year: Never true   Ran Out of Food in the Last Year: Never true  Transportation Needs: No Transportation Needs   Lack of Transportation (Medical): No   Lack of Transportation (Non-Medical): No  Physical Activity: Sufficiently Active   Days of Exercise per Week: 7 days   Minutes of Exercise per Session: 30 min  Stress: No Stress Concern Present   Feeling of Stress : Only a little  Social Connections: Moderately Integrated   Frequency of Communication with Friends and Family: More than three times a week   Frequency of Social Gatherings with Friends and Family: More than three times a week   Attends Religious Services: More than 4 times per year   Active Member of [employer] or Organizations: No   Attends Archivist Meetings: Never   Marital Status: Married   Human resources officer Violence: Not At Risk   Fear of Current or Ex-Partner: No   Emotionally Abused: No   Physically Abused: No   Sexually Abused: No      Review of Systems  All other systems reviewed and are negative.   PHYSICAL EXAMINATION:     BP 117/72 (BP Location: Right Arm, Patient Position: Sitting)   Pulse 80   Resp 20     General appearance: alert, cooperative and appears stated age Head: Normocephalic, without obvious abnormality, atraumatic Lungs: clear to auscultation bilaterally Heart: regular rate and rhythm Abdomen: soft, non-tender, no masses,  no organomegaly Extremities: extremities normal, atraumatic, no cyanosis or edema Skin: Skin color, texture, turgor normal. No rashes or lesions Lymph nodes: Cervical, supraclavicular, and axillary nodes normal. No abnormal  inguinal nodes palpated Neurologic: Grossly normal   Pelvic: Deferred.   ASSESSMENT   Postmenopausal bleeding.  Thickened endometrium with fluid in endometrial canal.  Cervical stenosis.  Hx breast cancer.  Recent discontinuation of Tamoxifen.  CHF.  Atrial fibrillation.  On coumadin.  COPD.  Low platelets.  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 HSV.  Elevated creatinine. Fluctuating levels.    PLAN   Will proceed with hysteroscopy with Myosure resection of endometrial thickening, dilation and curettage with ultrasound guidance.  Risks, benefits, and alternatives have been discussed with the patient who wishes to proceed.  Surgical expectations and recovery discussed.  Questions invited and answered.   Patient has received instructions from coumadin clinic for discontinuation of coumadin in preparation for surgery and then a Lovenox bridge.  She will bring her inhaler to the hospital with her for her procedure.  Rx for Valtrex.  I prescribed for her Valtrex 500 mg po bid x 3 days prn outbreak.  She will follow up after surgery for a 2 week post op check.   An After Visit Summary was printed and given to the  patient.

## 2020-11-17 NOTE — Telephone Encounter (Signed)
Patient would need an appointment, can be virtual if easier for pt.

## 2020-11-17 NOTE — Transfer of Care (Signed)
Immediate Anesthesia Transfer of Care Note  Patient: Danielle [name]  Procedure(s) Performed: DILATATION AND CURETTAGE /HYSTEROSCOPY WITH MYOSURE (Uterus) OPERATIVE ULTRASOUND (Uterus)  Patient Location: PACU  Anesthesia Type:General  Level of Consciousness: drowsy and patient cooperative  Airway & Oxygen Therapy: Patient Spontanous Breathing and Patient connected to face mask oxygen  Post-op Assessment: Report given to RN and Post -op Vital signs reviewed and stable  Post vital signs: Reviewed and stable  Last Vitals:  Vitals Value Taken Time  BP 100/72 11/17/20 0856  Temp    Pulse 73 11/17/20 0859  Resp 15 11/17/20 0859  SpO2 100 % 11/17/20 0859  Vitals shown include unvalidated device data.  Last Pain:  Vitals:   11/17/20 0617  TempSrc: Oral  PainSc: 0-No pain      Patients Stated Pain Goal: 3 ([id number])  Complications: No notable events documented.

## 2020-11-17 NOTE — Progress Notes (Signed)
Update to History and Physical  States she is ready for surgery.  Her last dose of coumadin was 6 days ago and her last dose of Lovenox was last night.  Patient examined.  Platelets 84,000. K 5.3.  Cr 1.60.  OK to proceed with surgery.

## 2020-11-17 NOTE — Op Note (Signed)
OPERATIVE REPORT  PREOPERATIVE DIAGNOSES:   Postmenopausal bleeding, endometrial thickening, cervical stenosis.  POSTOPERATIVE DIAGNOSES:   Postmenopausal bleeding, endometrial thickening, cervical stenosis.  PROCEDURE:  Hysteroscopy with dilation and curettage and Myosure resection of endometrium under ultrasound guidance.  SURGEON:  Lenard Galloway, MD  ANESTHESIA:  LMA, paracervical block with 10 mL of 1% lidocaine.  IV FLUIDS:  600 ccLR  EBL:  10 cc  URINE OUTPUT:   200 cc   NORMAL SALINE DEFICIT:  [id number] cc  COMPLICATIONS:  None.  INDICATIONS FOR THE PROCEDURE:     The patient is a 75 year old G53P0 African American female who presents with postmenopausal bleeding and thickened endometrium measuring 11.79 mm and fluid in the endometrial canal on pelvic ultrasound.  Her endometrial biopsy showed benign endometrium but the specimen was determined to be limited due to cervical stenosis with the office procedure.  She has been taking Tamoxifen due to her history of breast cancer.   A plan is now made to proceed with a hysteroscopy with dilation and curettage and Myosurgical resection of endometrial thickening under ultrasound guidance; after risks, benefits and alternatives were reviewed.  FINDINGS:  Exam under anesthesia revealed a small anteverted, mobile uterus.  No adnexal masses were noted.  The uterus was sounded to almost 7 cm.  Hysteroscopy showed cystic blebs of the endometrium throughout the cavity.  The tubal ostia regions were covered by the cystic endometrial change.  Endometrial currettings were scant.  SPECIMENS:  Endometrial currettings were sent to pathology separately from the endometrial resection using the Myosure instrumentation.   PROCEDURE IN DETAIL:  The patient was reidentified in the preoperative hold area.  She received PAS stockings and Lovenox for DVT prophylaxis.  She received Ancef 2 grams IV for antibiotic prophylaxis.   In the operating room, the  patient was placed in the dorsal lithotomy position and then an LMA anesthetic was introduced.  The patient's lower abdomen, vagina and perineum were sterilely prepped with Betadine and the patient's bladder was catheterized of urine.  She was sterilly draped.  An exam under anesthesia was performed.  A speculum was placed inside the vagina and a single-tooth tenaculum was placed on the anterior cervical lip.  A paracervical block was performed with a total of 10 mL of 1% lidocaine.  An attempt was made to sound the uterine length. Cervical stenosis was encountered.  An os finder was able to dilate the cervix. Pratt dilators would not dilate the cervix.  The bladder was retrograde filled with sterile normal saline using a red rubber catheter.  Hegar dilators were then used to dilate the cervix with ultrasound guidance.  Ultrasound was used to accompany the rest of the procedure.  The Myosure hysteroscope would not pass through the dilated cervix.  The diagnostic 2.9 mm diagnostic hysteroscope was successfully introduced into the endometrial cavity with infusion of normal saline. There was a limited view of cystic formation of the left uterine fundus.  The diagnostic hysteroscope was removed  the sharp and serrated curettes were used to sample the endometrium in all 4 quadrants.  This specimen was sent to pathology.  The cervix was then successfully dilated up to the [facility] dilator.  The Reach Myosure device was successfully placed in the uterine cavity under the infusion of normal saline.  The cystic formation of the entire endometrial cavity was noted and the areas were resected with the Myosure Reach. The Myosure instrument was removed.    The single-tooth tenaculum which  had been placed on the anterior cervical lip was removed and there was a small laceration of the anterior cervical lip.  This was sutured with a single suture of 3/0 Vicryl.    Hemostasis was good, and all of the vaginal  instruments were removed.  Her bladder was catheterized with a red rubber catheter.  The patient was awakened and escorted to the recovery room in stable condition after she was cleansed of Betadine.  There were no complications to the procedure.   All needle, instrument and sponge counts were correct.  Lenard Galloway, MD

## 2020-11-17 NOTE — Anesthesia Postprocedure Evaluation (Signed)
Anesthesia Post Note  Patient: [name]  Procedure(s) Performed: DILATATION AND CURETTAGE /HYSTEROSCOPY WITH MYOSURE (Uterus) OPERATIVE ULTRASOUND (Uterus)     Patient location during evaluation: PACU Anesthesia Type: General Level of consciousness: awake and alert and oriented Pain management: pain level controlled Vital Signs Assessment: post-procedure vital signs reviewed and stable Respiratory status: spontaneous breathing, nonlabored ventilation and respiratory function stable Cardiovascular status: blood pressure returned to baseline and stable Postop Assessment: no apparent nausea or vomiting Anesthetic complications: no   No notable events documented.  Last Vitals:  Vitals:   11/17/20 0915 11/17/20 0921  BP: 103/72   Pulse: 74   Resp: 17   Temp:    SpO2: 100% 100%    Last Pain:  Vitals:   11/17/20 0900  TempSrc:   PainSc: 0-No pain                 Frannie Shedrick A.

## 2020-11-18 ENCOUNTER — Encounter (HOSPITAL_BASED_OUTPATIENT_CLINIC_OR_DEPARTMENT_OTHER): Payer: Self-pay | Admitting: Obstetrics and Gynecology

## 2020-11-18 ENCOUNTER — Telehealth: Payer: Self-pay | Admitting: Family Medicine

## 2020-11-18 ENCOUNTER — Ambulatory Visit: Payer: Medicare Other | Admitting: Physical Medicine & Rehabilitation

## 2020-11-18 LAB — SURGICAL PATHOLOGY

## 2020-11-18 NOTE — Telephone Encounter (Signed)
Patient is asking how often and when she should take the medication Larene Beach provided her.  Patient could be contacted at [phone].  Please advise.

## 2020-11-18 NOTE — Telephone Encounter (Signed)
Pt wanted to know when she should take coumadin, in the AM or PM. Advised she should continue taking in the PM. Pt said she was doing well and bleeding is minimal and decreased from yesterday. Advised if bleeding would increase to contact the office immediately. Pt verbalized understanding.

## 2020-11-19 ENCOUNTER — Encounter: Payer: Self-pay | Admitting: Family Medicine

## 2020-11-19 ENCOUNTER — Telehealth (INDEPENDENT_AMBULATORY_CARE_PROVIDER_SITE_OTHER): Payer: Medicare Other | Admitting: Family Medicine

## 2020-11-19 ENCOUNTER — Other Ambulatory Visit: Payer: Self-pay

## 2020-11-19 VITALS — BP 115/65 | Ht 67.0 in

## 2020-11-19 DIAGNOSIS — E875 Hyperkalemia: Secondary | ICD-10-CM

## 2020-11-19 DIAGNOSIS — G4700 Insomnia, unspecified: Secondary | ICD-10-CM

## 2020-11-19 DIAGNOSIS — I10 Essential (primary) hypertension: Secondary | ICD-10-CM

## 2020-11-19 MED ORDER — TRAZODONE HCL 50 MG PO TABS: 25.0000 mg | ORAL_TABLET | Freq: Every evening | ORAL | 1 refills | Status: DC | PRN

## 2020-11-19 NOTE — Progress Notes (Signed)
Virtual Visit via Telephone Note I connected with Danielle [name] on 11/19/20 at  3:00 PM EDT by telephone and verified that I am speaking with the correct person using two identifiers.   I discussed the limitations, risks, security and privacy concerns of performing an evaluation and management service by telephone and the availability of in person appointments. I also discussed with the patient that there may be a patient responsible charge related to this service. The patient expressed understanding and agreed to proceed.  Location patient: home Location provider: work office Participants present for the call: patient, provider Patient did not have a visit in the prior 7 days to address this/these issue(s).  Chief Complaint  Patient presents with   Insomnia   History of Present Illness: Danielle [name] is a 75 y.o.female with hx of chronic low back pain with radiculopathy, CKD III,paroxysmal atrial fibrillation, s/p AVR on chronic anticoagulation, COPD, heart failure with reduced ejection fraction, and generalized arthritis complaining of worsening insomnia. She has tried OTC melatonin and Tylenol PM.  Wakes up and cannot go back to sleep. She can sleep about 2-3 hours. She is not sure about exacerbating factors. She has not found alleviating factors. Problem has been going on for a year and seems to be getting worse. She recently underwent surgery, dilation and curettage/hysteroscopy with Myosure. She is recovering well, she has not had vaginal bleeding for the past 2 days, and states that pain is not interfering with sleep. She follows with pain management, she is on chronic tramadol 50 mg, prescribed to take 3 times daily as needed.  She tells me that she does not take the medication daily, max 1-2 times per day.  Reported SBP 87. HFrEF and HTN: She is on Entresto 49-51 mg 1 tablet twice daily, spironolactone 25 mg 1/2 tablet daily, torsemide 20 mg daily,  carvedilol 12.5 mg twice daily. She is also on Farxiga 10 mg daily and fluid restriction. Mild dizziness. Negative for headache, CP, palpitation, or shortness of breath. Negative for fever, chills, diaphoresis, abdominal pain, N/V, changes in bowel habits, or urinary symptoms. Lab Results  Component Value Date   CREATININE 1.60 (H) 11/17/2020   BUN 27 (H) 11/17/2020   NA 142 11/17/2020   K 5.3 (H) 11/17/2020   CL 107 11/17/2020   CO2 24 11/10/2020   Lab Results  Component Value Date   WBC 5.4 11/17/2020   HGB 12.9 11/17/2020   HCT 38.0 11/17/2020   MCV 99.2 11/17/2020   PLT 84 (L) 11/17/2020   Observations/Objective: Patient sounds cheerful and well on the phone. I do not appreciate any SOB. Speech and thought processing are grossly intact. Patient reported vitals:BP 115/65   Ht 5\' 7"  (1.702 m)   BMI 39.47 kg/m   Assessment and Plan: Danielle [name] had a virtual visit today (video/telephone)  Diagnoses and all orders for this visit:  1. Insomnia, unspecified type We discussed different pharmacologic treatment and the risk of medication interaction and side effects given her age and medical conditions. She states that she does not take tramadol daily, so we will try trazodone, instructed to start with 25 mg 30 minutes before bedtime.  She can increase trazodone from 25 to 50 mg if needed. Recommend not to take tramadol within 8 hours after trazodone and to monitor for possible serotonin syndrome like symptoms. We also discussed the importance of adequate sleep hygiene. If trazodone does not help, we need to consider medication like temazepam. Follow-up in 4 to 5 weeks,  before if needed.  - traZODone (DESYREL) 50 MG tablet; Take 0.5-1 tablets (25-50 mg total) by mouth at bedtime as needed for sleep.  Dispense: 30 tablet; Refill: 1  2. Essential hypertension Mildly hypotensive at this time. Continue with all her medications for now. Instructed to contact her  cardiologist to let her know about her low BPs, if they continue. Clearly instructed about warning signs.  3. Hyperkalemia Mild. We discussed some side effects of her medications. She has an appointment with her oncologist on 11/21/2020.  Follow Up Instructions:  Return in about 4 weeks (around 12/17/2020).  I did not refer this patient for an OV in the next 24 hours for this/these issue(s).  I discussed the assessment and treatment plan with the patient. The patient was provided an opportunity to ask questions and all were answered. The patient agreed with the plan and demonstrated an understanding of the instructions.   The patient was advised to call back or seek an in-person evaluation if the symptoms worsen or if the condition fails to improve as anticipated.  I provided 13 minutes of non-face-to-face time during this encounter.  [name], MD

## 2020-11-21 ENCOUNTER — Inpatient Hospital Stay: Payer: Medicare Other

## 2020-11-21 ENCOUNTER — Other Ambulatory Visit: Payer: Self-pay

## 2020-11-21 ENCOUNTER — Inpatient Hospital Stay: Payer: Medicare Other | Attending: Nurse Practitioner | Admitting: Hematology

## 2020-11-21 VITALS — BP 114/78 | HR 77 | Temp 98.0°F | Resp 18 | Ht 67.0 in | Wt 251.0 lb

## 2020-11-21 DIAGNOSIS — I2510 Atherosclerotic heart disease of native coronary artery without angina pectoris: Secondary | ICD-10-CM | POA: Insufficient documentation

## 2020-11-21 DIAGNOSIS — Z170 Estrogen receptor positive status [ER+]: Secondary | ICD-10-CM

## 2020-11-21 DIAGNOSIS — K219 Gastro-esophageal reflux disease without esophagitis: Secondary | ICD-10-CM | POA: Insufficient documentation

## 2020-11-21 DIAGNOSIS — D696 Thrombocytopenia, unspecified: Secondary | ICD-10-CM | POA: Diagnosis not present

## 2020-11-21 DIAGNOSIS — Z923 Personal history of irradiation: Secondary | ICD-10-CM | POA: Diagnosis not present

## 2020-11-21 DIAGNOSIS — Z8673 Personal history of transient ischemic attack (TIA), and cerebral infarction without residual deficits: Secondary | ICD-10-CM | POA: Diagnosis not present

## 2020-11-21 DIAGNOSIS — Z79810 Long term (current) use of selective estrogen receptor modulators (SERMs): Secondary | ICD-10-CM | POA: Insufficient documentation

## 2020-11-21 DIAGNOSIS — Z79899 Other long term (current) drug therapy: Secondary | ICD-10-CM | POA: Diagnosis not present

## 2020-11-21 DIAGNOSIS — C50412 Malignant neoplasm of upper-outer quadrant of left female breast: Secondary | ICD-10-CM | POA: Insufficient documentation

## 2020-11-21 DIAGNOSIS — M810 Age-related osteoporosis without current pathological fracture: Secondary | ICD-10-CM | POA: Diagnosis not present

## 2020-11-21 DIAGNOSIS — I4891 Unspecified atrial fibrillation: Secondary | ICD-10-CM | POA: Diagnosis not present

## 2020-11-21 DIAGNOSIS — I509 Heart failure, unspecified: Secondary | ICD-10-CM | POA: Diagnosis not present

## 2020-11-21 DIAGNOSIS — Z7984 Long term (current) use of oral hypoglycemic drugs: Secondary | ICD-10-CM | POA: Insufficient documentation

## 2020-11-21 DIAGNOSIS — D649 Anemia, unspecified: Secondary | ICD-10-CM | POA: Insufficient documentation

## 2020-11-21 DIAGNOSIS — N183 Chronic kidney disease, stage 3 unspecified: Secondary | ICD-10-CM | POA: Insufficient documentation

## 2020-11-21 DIAGNOSIS — M129 Arthropathy, unspecified: Secondary | ICD-10-CM | POA: Insufficient documentation

## 2020-11-21 LAB — CBC WITH DIFFERENTIAL (CANCER CENTER ONLY)
Abs Immature Granulocytes: 0.03 10*3/uL (ref 0.00–0.07)
Basophils Absolute: 0 10*3/uL (ref 0.0–0.1)
Basophils Relative: 1 %
Eosinophils Absolute: 0.3 10*3/uL (ref 0.0–0.5)
Eosinophils Relative: 5 %
HCT: 33.6 % — ABNORMAL LOW (ref 36.0–46.0)
Hemoglobin: 10.9 g/dL — ABNORMAL LOW (ref 12.0–15.0)
Immature Granulocytes: 1 %
Lymphocytes Relative: 26 %
Lymphs Abs: 1.6 10*3/uL (ref 0.7–4.0)
MCH: 31.2 pg (ref 26.0–34.0)
MCHC: 32.4 g/dL (ref 30.0–36.0)
MCV: 96.3 fL (ref 80.0–100.0)
Monocytes Absolute: 0.6 10*3/uL (ref 0.1–1.0)
Monocytes Relative: 10 %
Neutro Abs: 3.7 10*3/uL (ref 1.7–7.7)
Neutrophils Relative %: 57 %
Platelet Count: 70 10*3/uL — ABNORMAL LOW (ref 150–400)
RBC: 3.49 MIL/uL — ABNORMAL LOW (ref 3.87–5.11)
RDW: 15 % (ref 11.5–15.5)
WBC Count: 6.3 10*3/uL (ref 4.0–10.5)
nRBC: 0 % (ref 0.0–0.2)

## 2020-11-21 LAB — CMP (CANCER CENTER ONLY)
ALT: 14 U/L (ref 0–44)
AST: 11 U/L — ABNORMAL LOW (ref 15–41)
Albumin: 3.4 g/dL — ABNORMAL LOW (ref 3.5–5.0)
Alkaline Phosphatase: 46 U/L (ref 38–126)
Anion gap: 7 (ref 5–15)
BUN: 19 mg/dL (ref 8–23)
CO2: 23 mmol/L (ref 22–32)
Calcium: 10 mg/dL (ref 8.9–10.3)
Chloride: 108 mmol/L (ref 98–111)
Creatinine: 1.4 mg/dL — ABNORMAL HIGH (ref 0.44–1.00)
GFR, Estimated: 39 mL/min — ABNORMAL LOW (ref >60)
Glucose, Bld: 93 mg/dL (ref 70–99)
Potassium: 4.9 mmol/L (ref 3.5–5.1)
Sodium: 138 mmol/L (ref 135–145)
Total Bilirubin: 0.4 mg/dL (ref 0.3–1.2)
Total Protein: 6.6 g/dL (ref 6.5–8.1)

## 2020-11-21 NOTE — Progress Notes (Signed)
[facility]   Telephone:([phone]) [phone] [phone]) [phone]   Clinic Follow up Note   Patient Care Team: [name], [name], MD as PCP - General (Family Medicine) End, Harrell Gave, MD as PCP - Cardiology (Cardiology) [name], MD as Consulting Physician (Hematology) Kyung Rudd, MD as Consulting Physician (Radiation Oncology) Rolm Bookbinder, MD as Consulting Physician (General Surgery) [name], Charlestine Massed, NP as Nurse Practitioner (Hematology and Oncology) Dimitri Ped, RN as Case Manager Viona Gilmore, [facility] as Pharmacist (Pharmacist) Alla Feeling, NP as Nurse Practitioner (Nurse Practitioner)  Date of Service:  11/21/2020  CHIEF COMPLAINT: f/u of left breast cancer  CURRENT THERAPY:  Adjuvant Tamoxifen 20 mg daily started on 04/20/17, stopped in August 2022 due to postmenopausal vaginal bleeding.  ASSESSMENT & PLAN:  Danielle [name] is a 75 y.o. female with   1. Malignant neoplasm of upper-outer quadrant of left breast, Stage IB, pT2N0M0, ER+/PR+, HER2-, Grade II -She was diagnosed in 11/2016. She is s/p left breast lumpectomy and adjuvant radiation.  -Oncotype Dx showed low risk(9 year distant recurrent 3% with Tamoxifen)  -She started Tamoxifen in 06/2017. Tolerating well, will continue for 5 to 10 years. Due to her severe arthritis, she may not be able to tolerate aromatase inhibitor. -She experienced some postmenopausal bleeding in 07/2020, and the tamoxifen was subsequently discontinued in 09/2020. She took the medicine for almost 4 years.  Due to her overall low risk, and possibility of side effect from AI, I am comfortable with moving to surveillance.she agrees with the plan. -She is clinically doing well from a breast cancer standpoint. Labs reviewed, CBC showed mild anemia and chronic thrombocytopenia. CMP pending. Her physical exam was unremarkable. -she will be due for annual mammography in 12/2020. -we will see her in a year  2.  Postmenopausal Bleeding -first noticed 07/2020, work up by Dr. [name] -tamoxifen discontinued -D&C on 11/17/20 showed benign pathology.   3. Recurrent Left breast and chest wall cellulitis in 2020, resolved   4. Osteoporosis, Arthritis with knee pain  -Underwent Left Hip surgery with rod placement in 2017, ambulates with walker -Her 10/2018 DEXA shows osteoporosis with lowest T-score -2.8. Repeat scheduled for 01/14/21.  -On Fosamax  -Arthritis with chronic knee pain. She will continues to f/u with pain specialist for possible injections.     5. CAD, Afib, CHF, Aortic Stenosis, H/o Stroke  -Continue medications and f/u with PCP and cardiologist.    6. Mild Thrombocytopenia, likely ITP  -Per pt she did have Platelet transfusion with her prior heart surgery.  -At this mild level, there is no indication for treatment. Will monitor. she is fine to continue Coumadin.  -Moderate and stable.      PLAN:  -Mammogram due in 12/2020 -proceed with DEXA on 01/14/21 -lab and f/u in 1 year. Continue breast cancer surveillance.   No problem-specific Assessment & Plan notes found for this encounter.   SUMMARY OF ONCOLOGIC HISTORY: Oncology History Overview Note  Cancer Staging Malignant neoplasm of upper-outer quadrant of left breast in female, estrogen receptor positive ([place]) Staging form: Breast, AJCC 8th Edition - Clinical stage from 12/13/2016: Stage IB (cT2, cN0, cM0, G2, ER: Positive, PR: Positive, HER2: Negative) - Signed by [name], MD on 12/21/2016 - Pathologic stage from 01/14/2017: Stage IA (pT2, pN0, cM0, G1, ER: Positive, PR: Positive, HER2: Negative, Oncotype DX score: 4) - Signed by [name], MD on 04/17/2017     Malignant neoplasm of upper-outer quadrant of left breast in female, estrogen receptor  positive ([place])  12/06/2016 Mammogram   Diagnostic Mammogram 12/06/16 IMPRESSION:  Suspicious mass in the left breast at 3 o'clock 2 cm from the nipple measuring 1.9 x 1.1 x 2.2  cm. RECOMMENDATION: Ultrasound-guided core biopsy of the mass in the 3 o'clock region of the left breast is recommended. The biopsy will be scheduled at the patient's convenience.   12/13/2016 Initial Biopsy   Diagnosis 12/13/16 Breast, left, needle core biopsy, 3:00 o'clock, 2cmfn - INVASIVE DUCTAL CARCINOMA - SEE COMMENT   12/16/2016 Initial Diagnosis   Malignant neoplasm of upper-outer quadrant of left breast in female, estrogen receptor positive ([place])   12/17/2016 Receptors her2   Estrogen Receptor: 100%, POSITIVE, STRONG STAINING INTENSITY Progesterone Receptor: 100%, POSITIVE, STRONG STAINING INTENSITY Proliferation Marker Ki67: 30% HER2 - NEGATIVE    01/14/2017 Surgery   Left breast lumpectomy with Dr. Donne Hazel   01/14/2017 Pathology Results   Diagnosis 01/14/17 1. Breast, lumpectomy, Left - INVASIVE DUCTAL CARCINOMA, GRADE I/III, SPANNING 2.1 CM. - DUCTAL CARCINOMA IN SITU, LOW GRADE. - INVASIVE CARCINOMA IS BROADLY PRESENT AT THE INFERIOR MARGIN OF SPECIMEN 1. - DUCTAL CARCINOMA IN SITU IS FOCALLY PRESENT AT THE INFERIOR MARGIN OF SPECIMEN 1 AND BROADLY LESS THAN 0.1 CM TO THE LATERAL MARGIN OF SPECIMEN 1. - SEE ONCOLOGY TABLE BELOW. 2. Breast, excision, Additional medial margin left - DUCTAL CARCINOMA IN SITU, LOW GRADE. - DUCTAL CARCINOMA IS FOCALLY LESS THAN 0.1 CM TO THE NEW MARGIN OF SPECIMEN 2. 3. Breast, excision, Additional lateral margin left - DUCTAL CARCINOMA IN SITU, LOW GRADE. - DUCTAL CARCINOMA IN SITU IS GREATER THAN 0.2 CM TO ALL MARGINS. 4. Breast, excision, Additional superior margin left - DUCTAL CARCINOMA IN SITU, LOW GRADE. - DUCTAL CARCINOMA IN SITU IS BROADLY LESS THAN 0.1 CM TO THE NEW MARGIN OF SPECIMEN 4. 5. Lymph node, sentinel, biopsy, Left axillary - THERE IS NO EVIDENCE OF CARCINOMA IN 1 OF 1 LYMPH NODE (0/1). 6. Breast, excision, Additional inferior margin left - DUCTAL CARCINOMA IN SITU, LOW GRADE. - DUCTAL CARCINOMA IN SITU IS  GREATER THAN 0.2 CM TO ALL MARGINS.    01/14/2017 Oncotype testing   Her oncotype recurrence score is 4 and her distance recurrent on Tamoxifen alone is 3%.   03/08/2017 - 04/05/2017 Radiation Therapy   RT with Dr. Lisbeth Renshaw    06/2017 -  Anti-estrogen oral therapy   Tamoxifen daily   12/15/2017 Mammogram   12/15/2017 Mammogram IMPRESSION: New lumpectomy site left breast. No mammographic evidence of malignancy in either breast.      INTERVAL HISTORY:  Danielle [name] is here for a follow up of breast cancer. She was last seen by me on 07/24/20, and by NP Lacie in the interim. She presents to the clinic accompanied by her husband. She reports discomfort to the underside of her left breast. Recall she previously had cellulitis in that breast, which has fully healed. She reports arthritis, mostly to her knees. She denies any back pain. She presents in a wheelchair today and notes she uses a walker at home.   All other systems were reviewed with the patient and are negative.  MEDICAL HISTORY:  Past Medical History:  Diagnosis Date   Anticoagulated on Coumadin    managed by cardiology   Anxiety    Chronic constipation    Chronic systolic CHF (congestive heart failure) ([place]) 2016   cardiologist--- dr end and followed by CHF clinic   CKD (chronic kidney disease), stage III ([place])    COPD (chronic obstructive  pulmonary disease) ([place])    previous had seen pulmonology --- dr Melvyn Novas, note in epic 03-14-2016, dx COPD GOLD 0   Coronary artery disease    cardiologist--- dr Durward Mallard;  last cardiac cath 01-10-20202 mild nonobstructive disease proxLAD;  per pt, had LHC prior to AVR in [place] 2016   DOE (dyspnea on exertion)    GERD (gastroesophageal reflux disease)    History of 2019 novel coronavirus disease (COVID-19) 12/19/2019   hospital admission w/ covid pneumonia , no intubatiion,  per pt symptoms resolved and back to baseline   History of cerebrovascular accident (CVA) with residual  deficit    CVA in 2000 without residuals;  CVA post op AVR 01/ 2016 with residual right hand weakness   History of gastritis    History of pneumothorax    s/p right vats 11-19-2014 and  s/p left vats  12-13-2015   (both spontenaous due to bleb)   Hyperlipidemia    Hypertension    Hyperthyroidism    per pt followed by pcp,  dx approx 2014   Malignant neoplasm of upper-outer quadrant of left breast in female, estrogen receptor positive ([place]) 11/2016   oncologist-- dr Burr Medico--- dx 10/ 2018  ,  s/p left lumptectomy with node dissection;  completed radiation 04-15-2017, no chemo   Multiple thyroid nodules    in care everywhere pt had left thyroid nodule biospy 09-18-2014 benign   NICM (nonischemic cardiomyopathy) ([place]) 2016   2016  ef 30%;  last echo in epic ef 45%   OA (osteoarthritis)    Osteoporosis    Paroxysmal atrial fibrillation [facility])    cardiologist--- dr end   Personal history of radiation therapy    left breast cancer  03-08-2017  to 04-05-2017   Thrombocytopenia [facility])    followed by dr Burr Medico   Valvular stenosis, aortic 03/12/2014   Status post bioprosthetic AVR  in [place]  for severe AS   Wears dentures    full upper    SURGICAL HISTORY: Past Surgical History:  Procedure Laterality Date   AORTIC VALVE REPLACEMENT  03/12/2014   [facility] [place];  St Jude 55m, medial Trifecta Bioprosthesis   BREAST LUMPECTOMY WITH RADIOACTIVE SEED AND SENTINEL LYMPH NODE BIOPSY Left 01/14/2017   Procedure: LEFT BREAST LUMPECTOMY WITH RADIOACTIVE SEED AND SENTINEL LYMPH NODE BIOPSY;  Surgeon: WRolm Bookbinder MD;  [place] [facility]  Service: General;  Laterality: Left;   CARDIAC CATHETERIZATION  02/12/2014   [facility] [place] N/A 11/17/2020   Procedure: DILATATION AND CURETTAGE /HYSTEROSCOPY WITH MYOSURE;  Surgeon: ANunzio Cobbs MD;  [place]  Service: Gynecology;  Laterality: N/A;   INTRAMEDULLARY (IM)  NAIL INTERTROCHANTERIC Left 12/10/2015   Procedure: INTRAMEDULLARY (IM) NAIL INTERTROCHANTRIC;  Surgeon: NLeandrew Koyanagi MD;  [place] [place]  Service: Orthopedics;  Laterality: Left;   OPERATIVE ULTRASOUND N/A 11/17/2020   Procedure: OPERATIVE ULTRASOUND;  Surgeon: ANunzio Cobbs MD;  [facility]  Service: Gynecology;  Laterality: N/A;   PLEURADESIS Left 12/03/2015   Procedure: PLEURADESIS;  Surgeon: SMelrose Nakayama MD;  [place]  Service: Thoracic;  Laterality: Left;   RESECTION OF APICAL BLEB Left 12/03/2015   Procedure: BLEBECTOMY;  Surgeon: SMelrose Nakayama MD;  [place]  Service: Thoracic;  Laterality: Left;   RIGHT/LEFT HEART CATH AND CORONARY ANGIOGRAPHY N/A 03/10/2020   Procedure: RIGHT/LEFT HEART CATH AND CORONARY ANGIOGRAPHY;  Surgeon: [name], MD;  Location: [facility];  Service: Cardiovascular;  Laterality: N/A;   VIDEO ASSISTED THORACOSCOPY Left 12/03/2015   Procedure: VIDEO ASSISTED THORACOSCOPY;  Surgeon: Melrose Nakayama, MD;  Location: [facility];  Service: Thoracic;  Laterality: Left;   VIDEO ASSISTED THORACOSCOPY (VATS) W/TALC PLEUADESIS Right 11/19/2014   in [place]    I have reviewed the social history and family history with the patient and they are unchanged from previous note.  ALLERGIES:  is allergic to lisinopril, tetanus toxoid adsorbed, and other.  MEDICATIONS:  Current Outpatient Medications  Medication Sig Dispense Refill   albuterol (PROVENTIL HFA;VENTOLIN HFA) 108 (90 Base) MCG/ACT inhaler Inhale 2 puffs into the lungs every 6 (six) hours as needed for wheezing or shortness of breath.     alendronate (FOSAMAX) 70 MG tablet Take 1 tablet (70 mg total) by mouth once a week. Take with a full glass of water on an empty stomach. (Patient taking differently: Take 70 mg by mouth once a week. Saturday's ; Take with a full glass of water on an empty stomach.) 13 tablet 3   ascorbic acid  (VITAMIN C) 500 MG tablet Take 500 mg by mouth daily.     budesonide-formoterol (SYMBICORT) 160-4.5 MCG/ACT inhaler Inhale 2 puffs into the lungs daily as needed. 3 each 3   carvedilol (COREG) 12.5 MG tablet Take 1 tablet (12.5 mg total) by mouth 2 (two) times daily with a meal. (Patient taking differently: Take 12.5 mg by mouth 2 (two) times daily with a meal.) 180 tablet 0   Cholecalciferol 25 MCG (1000 UT) tablet Take 1,000 Units by mouth daily.     dapagliflozin propanediol (FARXIGA) 10 MG TABS tablet Take 1 tablet (10 mg total) by mouth daily before breakfast. (Patient taking differently: Take 10 mg by mouth daily before breakfast.) 30 tablet 3   enoxaparin (LOVENOX) 120 MG/0.8ML injection Inject 0.8 mLs (120 mg total) into the skin every 12 (twelve) hours. 15.2 mL 0   fluticasone (FLONASE) 50 MCG/ACT nasal spray Place 1 spray into both nostrils daily. (Patient taking differently: Place 1 spray into both nostrils daily as needed.) 16 g 3   methimazole (TAPAZOLE) 5 MG tablet Take 3 tablets (15 mg total) by mouth daily. (Patient taking differently: Take 15 mg by mouth at bedtime.) 270 tablet 2   pantoprazole (PROTONIX) 40 MG tablet Take 1 tablet (40 mg total) by mouth daily. (Patient taking differently: Take 40 mg by mouth daily.) 30 tablet 11   rosuvastatin (CRESTOR) 40 MG tablet TAKE 1 TABLET BY MOUTH  DAILY (Patient taking differently: Take 40 mg by mouth at bedtime.) 90 tablet 3   sacubitril-valsartan (ENTRESTO) 49-51 MG Take 1 tablet by mouth 2 (two) times daily.     senna-docusate (SENOKOT-S) 8.6-50 MG tablet Take 2 tablets by mouth at bedtime.     spironolactone (ALDACTONE) 25 MG tablet Take 0.5 tablets (12.5 mg total) by mouth daily. 45 tablet 3   torsemide (DEMADEX) 20 MG tablet Take 1 tablet (20 mg total) by mouth daily. 90 tablet 3   traMADol (ULTRAM) 50 MG tablet Take 1 tablet (50 mg total) by mouth 3 (three) times daily. 90 tablet 5   traZODone (DESYREL) 50 MG tablet Take 0.5-1 tablets  (25-50 mg total) by mouth at bedtime as needed for sleep. 30 tablet 1   trolamine salicylate (ASPERCREME) 10 % cream Apply 1 application topically as needed for muscle pain.     valACYclovir (VALTREX) 500 MG tablet  Take on tablet by mouth twice a day for 3 days as needed for an outbreak (Patient taking differently: Take 500 mg by mouth. Take on tablet by mouth twice a day for 3 days as needed for an outbreak) 30 tablet 0   warfarin (COUMADIN) 3 MG tablet Take 1 tablet daily or Take as directed by anticoagulation clinic 90 tablet 1   No current facility-administered medications for this visit.    PHYSICAL EXAMINATION: ECOG PERFORMANCE STATUS: 1 - Symptomatic but completely ambulatory  There were no vitals filed for this visit. Wt Readings from Last 3 Encounters:  11/17/20 252 lb (114.3 kg)  11/12/20 254 lb (115.2 kg)  10/29/20 251 lb 1.6 oz (113.9 kg)     GENERAL:alert, no distress and comfortable SKIN: skin color, texture, turgor are normal, no rashes or significant lesions EYES: normal, Conjunctiva are pink and non-injected, sclera clear  LYMPH:  no palpable lymphadenopathy in the cervical, axillary  Musculoskeletal:no cyanosis of digits and no clubbing  NEURO: alert & oriented x 3 with fluent speech, no focal motor/sensory deficits BREAST: No palpable mass, nodules or adenopathy bilaterally. Breast exam benign.   LABORATORY DATA:  I have reviewed the data as listed CBC Latest Ref Rng & Units 11/17/2020 11/17/2020 10/29/2020  WBC 4.0 - 10.5 K/uL - 5.4 4.9  Hemoglobin 12.0 - 15.0 g/dL 12.9 12.1 12.1  Hematocrit 36.0 - 46.0 % 38.0 37.9 37.5  Platelets 150 - 400 K/uL - 84(L) 91(L)     CMP Latest Ref Rng & Units 11/17/2020 11/10/2020 10/29/2020  Glucose 70 - 99 mg/dL 100(H) 102(H) 91  BUN 8 - 23 mg/dL 27(H) 22 16  Creatinine 0.44 - 1.00 mg/dL 1.60(H) 1.78(H) 1.64(H)  Sodium 135 - 145 mmol/L 142 139 141  Potassium 3.5 - 5.1 mmol/L 5.3(H) 4.5 4.8  Chloride 98 - 111 mmol/L 107 108 108   CO2 22 - 32 mmol/L - 24 25  Calcium 8.9 - 10.3 mg/dL - 9.6 9.8  Total Protein 6.5 - 8.1 g/dL - - 7.3  Total Bilirubin 0.3 - 1.2 mg/dL - - 0.4  Alkaline Phos 38 - 126 U/L - - 50  AST 15 - 41 U/L - - 13(L)  ALT 0 - 44 U/L - - 11      RADIOGRAPHIC STUDIES: I have personally reviewed the radiological images as listed and agreed with the findings in the report. No results found.    No orders of the defined types were placed in this encounter.  All questions were answered. The patient knows to call the clinic with any problems, questions or concerns. No barriers to learning was detected. The total time spent in the appointment was 25 minutes.     [name], MD 11/21/2020   I, Wilburn Mylar, am acting as scribe for [name], MD.   I have reviewed the above documentation for accuracy and completeness, and I agree with the above.

## 2020-11-22 ENCOUNTER — Encounter: Payer: Self-pay | Admitting: Hematology

## 2020-11-25 ENCOUNTER — Other Ambulatory Visit: Payer: Self-pay

## 2020-11-25 ENCOUNTER — Ambulatory Visit (INDEPENDENT_AMBULATORY_CARE_PROVIDER_SITE_OTHER): Payer: Medicare Other

## 2020-11-25 ENCOUNTER — Telehealth: Payer: Self-pay

## 2020-11-25 DIAGNOSIS — Z7901 Long term (current) use of anticoagulants: Secondary | ICD-10-CM

## 2020-11-25 LAB — POCT INR: INR: 1.8 — AB (ref 2.0–3.0)

## 2020-11-25 MED ORDER — BUDESONIDE-FORMOTEROL FUMARATE 160-4.5 MCG/ACT IN AERO: 2.0000 | INHALATION_SPRAY | Freq: Every day | RESPIRATORY_TRACT | 3 refills | Status: DC | PRN

## 2020-11-25 NOTE — Progress Notes (Signed)
GYNECOLOGY  VISIT   HPI: 75 y.o.   Married  [place] American  female   [id number] with No LMP recorded. Patient is postmenopausal.   here for 1 week status post DILATATION AND CURETTAGE /HYSTEROSCOPY WITH MYOSURE (Uterus)  OPERATIVE ULTRASOUND (Uterus). Final pathology report showing benign endometrial polyp benign cervix and endometrium.   Patient's husband is present today for the visit.   Bled for one day following the procedure.  No pain.   Back on coumadin now.  Went to coumadin clinic yesterday.   Patient is now off Tamoxifen.  She is not doing any additional treatment for breast cancer prevention.   GYNECOLOGIC HISTORY: No LMP recorded. Patient is postmenopausal. Contraception:  PMP Menopausal hormone therapy:  none Last mammogram: 01-16-20  Diag.Bil./status post Lt.lumpectomy 2018/Neg/BiRads2 Last pap smear:  08-20-20 Neg:Neg HR HPV. Years ago normal always        OB History     Gravida  0   Para  0   Term  0   Preterm  0   AB  0   Living  0      SAB  0   IAB  0   Ectopic  0   Multiple  0   Live Births  0              Patient Active Problem List   Diagnosis Date Noted   Gait abnormality 07/31/2020   Paresthesia 07/31/2020   Low back pain with bilateral sciatica 07/31/2020   Pneumonia due to COVID-19 virus    Bacteremia due to Gram-positive bacteria 08/29/2018   Abscess    Cellulitis of left breast 08/28/2018   Cellulitis of left abdominal wall 08/28/2018   Encounter for routine gynecological examination 04/28/2018   Stage 3 chronic kidney disease ([place]) 09/27/2017   Recurrent genital herpes 04/13/2017   Chronic pain disorder 01/25/2017   Malignant neoplasm of upper-outer quadrant of left breast in female, estrogen receptor positive ([place]) 12/16/2016   Chronic knee pain 12/07/2016   Chronic bilateral low back pain without sciatica 12/07/2016   Peripheral musculoskeletal gait disorder 12/07/2016   Encounter for therapeutic drug monitoring  06/16/2016   Generalized osteoarthritis of multiple sites 05/04/2016   Chronic anticoagulation 05/04/2016   Stroke ([place]) 04/14/2016   Aortic valve disease 04/10/2016   S/P AVR 04/10/2016   NICM (nonischemic cardiomyopathy) ([place]) 04/10/2016   Upper airway cough syndrome 03/14/2016   Morbid obesity ([place]) 03/14/2016   COPD (chronic obstructive pulmonary disease) ([place]) 03/11/2016   Thrombocytopenia ([place]) 12/12/2015   Anemia 12/12/2015   Vitamin D deficiency 12/12/2015   Acute urinary retention 12/11/2015   Osteoporosis 12/11/2015   Hip fracture ([place]) 12/10/2015   Aortic atherosclerosis ([place]) 12/10/2015   Lung blebs ([place]) 12/03/2015   Pelvic fracture ([place]) 08/19/2015   Fall at home 08/19/2015   Essential hypertension 08/19/2015   Mixed hyperlipidemia 08/19/2015   Asthma 08/19/2015   Paroxysmal atrial fibrillation ([place]) 08/19/2015   Hyperthyroidism 08/19/2015   Chronic HFrEF (heart failure with reduced ejection fraction) ([place])    Coronary artery disease    Aortic stenosis     Past Medical History:  Diagnosis Date   Anticoagulated on Coumadin    managed by cardiology   Anxiety    Chronic constipation    Chronic systolic CHF (congestive heart failure) ([place]) 2016   cardiologist--- dr end and followed by CHF clinic   CKD (chronic kidney disease), stage III ([place])    COPD (chronic obstructive pulmonary disease) ([place])  previous had seen pulmonology --- dr Melvyn Novas, note in epic 03-14-2016, dx COPD GOLD 0   Coronary artery disease    cardiologist--- dr Durward Mallard;  last cardiac cath 01-10-20202 mild nonobstructive disease proxLAD;  per pt, had LHC prior to AVR in [place] 2016   DOE (dyspnea on exertion)    GERD (gastroesophageal reflux disease)    History of 2019 novel coronavirus disease (COVID-19) 12/19/2019   hospital admission w/ covid pneumonia , no intubatiion,  per pt symptoms resolved and back to baseline   History of cerebrovascular accident (CVA) with residual deficit    CVA in  2000 without residuals;  CVA post op AVR 01/ 2016 with residual right hand weakness   History of gastritis    History of pneumothorax    s/p right vats 11-19-2014 and  s/p left vats  12-13-2015   (both spontenaous due to bleb)   Hyperlipidemia    Hypertension    Hyperthyroidism    per pt followed by pcp,  dx approx 2014   Malignant neoplasm of upper-outer quadrant of left breast in female, estrogen receptor positive ([place]) 11/2016   oncologist-- dr Burr Medico--- dx 10/ 2018  ,  s/p left lumptectomy with node dissection;  completed radiation 04-15-2017, no chemo   Multiple thyroid nodules    in care everywhere pt had left thyroid nodule biospy 09-18-2014 benign   NICM (nonischemic cardiomyopathy) ([place]) 2016   2016  ef 30%;  last echo in epic ef 45%   OA (osteoarthritis)    Osteoporosis    Paroxysmal atrial fibrillation [facility])    cardiologist--- dr end   Personal history of radiation therapy    left breast cancer  03-08-2017  to 04-05-2017   Thrombocytopenia [facility])    followed by dr Burr Medico   Valvular stenosis, aortic 03/12/2014   Status post bioprosthetic AVR  in [place]  for severe AS   Wears dentures    full upper    Past Surgical History:  Procedure Laterality Date   AORTIC VALVE REPLACEMENT  03/12/2014   [facility] [place];  St Jude 7mm, medial Trifecta Bioprosthesis   BREAST LUMPECTOMY WITH RADIOACTIVE SEED AND SENTINEL LYMPH NODE BIOPSY Left 01/14/2017   Procedure: LEFT BREAST LUMPECTOMY WITH RADIOACTIVE SEED AND SENTINEL LYMPH NODE BIOPSY;  Surgeon: Rolm Bookbinder, MD;  Location: [place];  Service: General;  Laterality: Left;   CARDIAC CATHETERIZATION  02/12/2014   [facility] in [place] D & C N/A 11/17/2020   Procedure: DILATATION AND CURETTAGE /HYSTEROSCOPY WITH MYOSURE;  Surgeon: Nunzio Cobbs, MD;  Location: [place];  Service: Gynecology;  Laterality: N/A;   INTRAMEDULLARY (IM) NAIL INTERTROCHANTERIC Left  12/10/2015   Procedure: INTRAMEDULLARY (IM) NAIL INTERTROCHANTRIC;  Surgeon: Leandrew Koyanagi, MD;  Location: [place];  Service: Orthopedics;  Laterality: Left;   OPERATIVE ULTRASOUND N/A 11/17/2020   Procedure: OPERATIVE ULTRASOUND;  Surgeon: Nunzio Cobbs, MD;  Location: [facility];  Service: Gynecology;  Laterality: N/A;   PLEURADESIS Left 12/03/2015   Procedure: PLEURADESIS;  Surgeon: Melrose Nakayama, MD;  Location: [place];  Service: Thoracic;  Laterality: Left;   RESECTION OF APICAL BLEB Left 12/03/2015   Procedure: BLEBECTOMY;  Surgeon: Melrose Nakayama, MD;  Location: [place];  Service: Thoracic;  Laterality: Left;   RIGHT/LEFT HEART CATH AND CORONARY ANGIOGRAPHY N/A 03/10/2020   Procedure: RIGHT/LEFT HEART CATH AND CORONARY ANGIOGRAPHY;  Surgeon: [name], MD;  Location: [facility]  INVASIVE CV LAB;  Service: Cardiovascular;  Laterality: N/A;   VIDEO ASSISTED THORACOSCOPY Left 12/03/2015   Procedure: VIDEO ASSISTED THORACOSCOPY;  Surgeon: Melrose Nakayama, MD;  Location: [place];  Service: Thoracic;  Laterality: Left;   VIDEO ASSISTED THORACOSCOPY (VATS) W/TALC PLEUADESIS Right 11/19/2014   in [place]    Current Outpatient Medications  Medication Sig Dispense Refill   albuterol (PROVENTIL HFA;VENTOLIN HFA) 108 (90 Base) MCG/ACT inhaler Inhale 2 puffs into the lungs every 6 (six) hours as needed for wheezing or shortness of breath.     alendronate (FOSAMAX) 70 MG tablet Take 1 tablet (70 mg total) by mouth once a week. Take with a full glass of water on an empty stomach. (Patient taking differently: Take 70 mg by mouth once a week. Saturday's ; Take with a full glass of water on an empty stomach.) 13 tablet 3   ascorbic acid (VITAMIN C) 500 MG tablet Take 500 mg by mouth daily.     budesonide-formoterol (SYMBICORT) 160-4.5 MCG/ACT inhaler Inhale 2 puffs into the lungs daily as needed. 3 each 3   carvedilol (COREG) 12.5 MG tablet Take 1 tablet (12.5 mg  total) by mouth 2 (two) times daily with a meal. (Patient taking differently: Take 12.5 mg by mouth 2 (two) times daily with a meal.) 180 tablet 0   Cholecalciferol 25 MCG (1000 UT) tablet Take 1,000 Units by mouth daily.     dapagliflozin propanediol (FARXIGA) 10 MG TABS tablet Take 1 tablet (10 mg total) by mouth daily before breakfast. (Patient taking differently: Take 10 mg by mouth daily before breakfast.) 30 tablet 3   enoxaparin (LOVENOX) 120 MG/0.8ML injection Inject 0.8 mLs (120 mg total) into the skin every 12 (twelve) hours. 15.2 mL 0   fluticasone (FLONASE) 50 MCG/ACT nasal spray Place 1 spray into both nostrils daily. (Patient taking differently: Place 1 spray into both nostrils daily as needed.) 16 g 3   methimazole (TAPAZOLE) 5 MG tablet Take 3 tablets (15 mg total) by mouth daily. (Patient taking differently: Take 15 mg by mouth at bedtime.) 270 tablet 2   pantoprazole (PROTONIX) 40 MG tablet Take 1 tablet (40 mg total) by mouth daily. (Patient taking differently: Take 40 mg by mouth daily.) 30 tablet 11   rosuvastatin (CRESTOR) 40 MG tablet TAKE 1 TABLET BY MOUTH  DAILY (Patient taking differently: Take 40 mg by mouth at bedtime.) 90 tablet 3   sacubitril-valsartan (ENTRESTO) 49-51 MG Take 1 tablet by mouth 2 (two) times daily.     senna-docusate (SENOKOT-S) 8.6-50 MG tablet Take 2 tablets by mouth at bedtime.     spironolactone (ALDACTONE) 25 MG tablet Take 0.5 tablets (12.5 mg total) by mouth daily. 45 tablet 3   torsemide (DEMADEX) 20 MG tablet Take 1 tablet (20 mg total) by mouth daily. 90 tablet 3   traMADol (ULTRAM) 50 MG tablet Take 1 tablet (50 mg total) by mouth 3 (three) times daily. 90 tablet 5   traZODone (DESYREL) 50 MG tablet Take 0.5-1 tablets (25-50 mg total) by mouth at bedtime as needed for sleep. 30 tablet 1   trolamine salicylate (ASPERCREME) 10 % cream Apply 1 application topically as needed for muscle pain.     valACYclovir (VALTREX) 500 MG tablet Take on tablet  by mouth twice a day for 3 days as needed for an outbreak (Patient taking differently: Take 500 mg by mouth. Take on tablet by mouth twice a day for 3 days as needed for an outbreak) 30  tablet 0   warfarin (COUMADIN) 3 MG tablet Take 1 tablet daily or Take as directed by anticoagulation clinic 90 tablet 1   No current facility-administered medications for this visit.     ALLERGIES: Lisinopril, Tetanus toxoid adsorbed, and Other  Family History  Problem Relation Age of Onset   Diabetes Mother    Heart attack Mother 5   Diabetes Father    Lung cancer Father    Diabetes Sister    Thyroid disease Sister    Diabetes Sister    HIV Brother     Social History   Socioeconomic History   Marital status: Married    Spouse name: [name]   Number of children: 0   Years of education: 12   Highest education level: 12th grade  Occupational History   Occupation: Retired in 2004  Tobacco Use   Smoking status: Former    Packs/day: 0.25    Years: 10.00    Pack years: 2.50    Types: Cigarettes    Quit date: 2012    Years since quitting: 10.7   Smokeless tobacco: Never  Vaping Use   Vaping Use: Never used  Substance and Sexual Activity   Alcohol use: No   Drug use: Never   Sexual activity: Yes    Birth control/protection: Post-menopausal  Other Topics Concern   Not on file  Social History Narrative   Lives with husband.  Ambulated independently.   Right-handed.   No daily caffeine use.   Social Determinants of Health   Financial Resource Strain: Medium Risk   Difficulty of Paying Living Expenses: Somewhat hard  Food Insecurity: No Food Insecurity   Worried About Charity fundraiser in the Last Year: Never true   Ran Out of Food in the Last Year: Never true  Transportation Needs: No Transportation Needs   Lack of Transportation (Medical): No   Lack of Transportation (Non-Medical): No  Physical Activity: Sufficiently Active   Days of Exercise per Week: 7 days   Minutes of  Exercise per Session: 30 min  Stress: No Stress Concern Present   Feeling of Stress : Only a little  Social Connections: Moderately Integrated   Frequency of Communication with Friends and Family: More than three times a week   Frequency of Social Gatherings with Friends and Family: More than three times a week   Attends Religious Services: More than 4 times per year   Active Member of [employer] or Organizations: No   Attends Archivist Meetings: Never   Marital Status: Married  Human resources officer Violence: Not At Risk   Fear of Current or Ex-Partner: No   Emotionally Abused: No   Physically Abused: No   Sexually Abused: No    Review of Systems  All other systems reviewed and are negative.  PHYSICAL EXAMINATION:    BP 124/66   Pulse 80   SpO2 97%     General appearance: alert, cooperative and appears stated age   ASSESSMENT  Status post hysteroscopic resection of endometrial polyps, dilation and curettage.  Tamoxifen discontinued.   PLAN  Surgical findings, procedure and pathology report discussed.  We discussed the effect of tamoxifen on the endometrium and the likely cause for the bleeding and change. Call for any further vaginal bleeding.  Next pap in 2 years.  FU prn.    An After Visit Summary was printed and given to the patient.

## 2020-11-25 NOTE — Telephone Encounter (Signed)
Pt was in this morning for coumadin clinic apt and requested information about paperwork she had dropped off at the [facility] for financial assistance for symbicort. She thinks she dropped it off about 3 months ago but has not heard anything about the progress on it. Advised pt a msg would be sent and Dr. Doug Sou CMA will f/u. Also printed financial assistance paperwork for symbicort for pt in case it is needed if any was lost or misplaced so pt would have another copy. Pt was very appreciative.

## 2020-11-25 NOTE — Patient Instructions (Addendum)
Pre visit review using our clinic review tool, if applicable. No additional management support is needed unless otherwise documented below in the visit note.  Increase dose today to 1 1/2 tablets and then continue taking 1 tablet daily except take 1/2 tablet on Mon, Wed, and Fri and recheck in 4 wks.

## 2020-11-26 ENCOUNTER — Ambulatory Visit (INDEPENDENT_AMBULATORY_CARE_PROVIDER_SITE_OTHER): Payer: Medicare Other | Admitting: Obstetrics and Gynecology

## 2020-11-26 ENCOUNTER — Encounter: Payer: Self-pay | Admitting: Obstetrics and Gynecology

## 2020-11-26 ENCOUNTER — Ambulatory Visit: Payer: Medicare Other | Admitting: Family Medicine

## 2020-11-26 VITALS — BP 124/66 | HR 80

## 2020-11-26 DIAGNOSIS — Z9889 Other specified postprocedural states: Secondary | ICD-10-CM

## 2020-11-27 ENCOUNTER — Encounter: Payer: Medicare Other | Attending: Physical Medicine & Rehabilitation | Admitting: Physical Medicine & Rehabilitation

## 2020-11-27 ENCOUNTER — Telehealth (HOSPITAL_COMMUNITY): Payer: Self-pay | Admitting: *Deleted

## 2020-11-27 ENCOUNTER — Encounter: Payer: Self-pay | Admitting: Physical Medicine & Rehabilitation

## 2020-11-27 ENCOUNTER — Other Ambulatory Visit: Payer: Self-pay

## 2020-11-27 VITALS — BP 135/83 | HR 71 | Temp 98.3°F | Ht 67.0 in | Wt 247.0 lb

## 2020-11-27 DIAGNOSIS — M47816 Spondylosis without myelopathy or radiculopathy, lumbar region: Secondary | ICD-10-CM | POA: Insufficient documentation

## 2020-11-27 DIAGNOSIS — M25562 Pain in left knee: Secondary | ICD-10-CM | POA: Insufficient documentation

## 2020-11-27 DIAGNOSIS — M25561 Pain in right knee: Secondary | ICD-10-CM | POA: Diagnosis not present

## 2020-11-27 DIAGNOSIS — G8929 Other chronic pain: Secondary | ICD-10-CM | POA: Insufficient documentation

## 2020-11-27 NOTE — Telephone Encounter (Signed)
Reaching out to patient to offer assistance regarding upcoming cardiac imaging study; pt verbalizes understanding of appt date/time, parking situation and where to check in, and verified current allergies; name and call back number provided for further questions should they arise  Leighana Neyman RN Navigator Cardiac Imaging [facility] [phone] office [phone] cell  

## 2020-11-27 NOTE — Progress Notes (Signed)
Subjective:    Patient ID: Danielle [name], female    DOB: December 04, 1945, 75 y.o.   MRN: [id number]  HPI 75 year old female with history of atrial fibrillation on Coumadin, chronic congestive heart failure, history of breast cancer, CKD 3 who returns today with complaints of chronic low back pain as well as chronic left greater than right knee pain.  She has seen orthopedics, Dr. Erlinda Hong in the past in prior to managing to [facility] had corticosteroid injections done at another doctor's office.  The patient states that the knee injections with cortisone were not helpful, she has not tried any gel injections.  The patient has significant cardiac comorbidities and would be high risk for major elective surgery Her back pain is in the lumbar area.  She has had no falls or trauma.  She has had no pain radiating down the lower extremities. Had aquatic therapy 2 sessions with physical therapy  CLBP no prolonged relief with lumbar RF  No weight gain or weight loss Sister passed ~1 yr ago  Pain Inventory Average Pain 6 Pain Right Now 8 My pain is constant and aching  In the last 24 hours, has pain interfered with the following? General activity 4 Relation with others 0 Enjoyment of life 8 What TIME of day is your pain at its worst? morning  Sleep (in general) Poor  Pain is worse with: walking and standing Pain improves with: medication Relief from Meds: 5  Family History  Problem Relation Age of Onset   Diabetes Mother    Heart attack Mother 72   Diabetes Father    Lung cancer Father    Diabetes Sister    Thyroid disease Sister    Diabetes Sister    HIV Brother    Social History   Socioeconomic History   Marital status: Married    Spouse name: Sherwood   Number of children: 0   Years of education: 12   Highest education level: 12th grade  Occupational History   Occupation: Retired in 2004  Tobacco Use   Smoking status: Former    Packs/day: 0.25    Years: 10.00     Pack years: 2.50    Types: Cigarettes    Quit date: 2012    Years since quitting: 10.7   Smokeless tobacco: Never  Vaping Use   Vaping Use: Never used  Substance and Sexual Activity   Alcohol use: No   Drug use: Never   Sexual activity: Yes    Birth control/protection: Post-menopausal  Other Topics Concern   Not on file  Social History Narrative   Lives with husband.  Ambulated independently.   Right-handed.   No daily caffeine use.   Social Determinants of Health   Financial Resource Strain: Medium Risk   Difficulty of Paying Living Expenses: Somewhat hard  Food Insecurity: No Food Insecurity   Worried About Charity fundraiser in the Last Year: Never true   Ran Out of Food in the Last Year: Never true  Transportation Needs: No Transportation Needs   Lack of Transportation (Medical): No   Lack of Transportation (Non-Medical): No  Physical Activity: Sufficiently Active   Days of Exercise per Week: 7 days   Minutes of Exercise per Session: 30 min  Stress: No Stress Concern Present   Feeling of Stress : Only a little  Social Connections: Moderately Integrated   Frequency of Communication with Friends and Family: More than three times a week   Frequency of Social Gatherings with Friends  and Family: More than three times a week   Attends Religious Services: More than 4 times per year   Active Member of Clubs or Organizations: No   Attends Archivist Meetings: Never   Marital Status: Married   Past Surgical History:  Procedure Laterality Date   AORTIC VALVE REPLACEMENT  03/12/2014   [facility] in [place];  St Jude 47mm, medial Trifecta Bioprosthesis   BREAST LUMPECTOMY WITH RADIOACTIVE SEED AND SENTINEL LYMPH NODE BIOPSY Left 01/14/2017   Procedure: LEFT BREAST LUMPECTOMY WITH RADIOACTIVE SEED AND SENTINEL LYMPH NODE BIOPSY;  Surgeon: Rolm Bookbinder, MD;  Location: [place];  Service: General;  Laterality: Left;   CARDIAC CATHETERIZATION  02/12/2014    [facility] in [place] D & C N/A 11/17/2020   Procedure: DILATATION AND CURETTAGE /HYSTEROSCOPY WITH MYOSURE;  Surgeon: Nunzio Cobbs, MD;  Location: [facility];  Service: Gynecology;  Laterality: N/A;   INTRAMEDULLARY (IM) NAIL INTERTROCHANTERIC Left 12/10/2015   Procedure: INTRAMEDULLARY (IM) NAIL INTERTROCHANTRIC;  Surgeon: Leandrew Koyanagi, MD;  Location: [place];  Service: Orthopedics;  Laterality: Left;   OPERATIVE ULTRASOUND N/A 11/17/2020   Procedure: OPERATIVE ULTRASOUND;  Surgeon: Nunzio Cobbs, MD;  Location: [facility];  Service: Gynecology;  Laterality: N/A;   PLEURADESIS Left 12/03/2015   Procedure: PLEURADESIS;  Surgeon: Melrose Nakayama, MD;  Location: [place];  Service: Thoracic;  Laterality: Left;   RESECTION OF APICAL BLEB Left 12/03/2015   Procedure: BLEBECTOMY;  Surgeon: Melrose Nakayama, MD;  Location: [place];  Service: Thoracic;  Laterality: Left;   RIGHT/LEFT HEART CATH AND CORONARY ANGIOGRAPHY N/A 03/10/2020   Procedure: RIGHT/LEFT HEART CATH AND CORONARY ANGIOGRAPHY;  Surgeon: [name], MD;  Location: [place] [facility];  Service: Cardiovascular;  Laterality: N/A;   VIDEO ASSISTED THORACOSCOPY Left 12/03/2015   Procedure: VIDEO ASSISTED THORACOSCOPY;  Surgeon: Melrose Nakayama, MD;  Location: [place];  Service: Thoracic;  Laterality: Left;   VIDEO ASSISTED THORACOSCOPY (VATS) W/TALC PLEUADESIS Right 11/19/2014   in [place]   Past Surgical History:  Procedure Laterality Date   AORTIC VALVE REPLACEMENT  03/12/2014   [facility] in [place];  St Jude 54mm, medial Trifecta Bioprosthesis   BREAST LUMPECTOMY WITH RADIOACTIVE SEED AND SENTINEL LYMPH NODE BIOPSY Left 01/14/2017   Procedure: LEFT BREAST LUMPECTOMY WITH RADIOACTIVE SEED AND SENTINEL LYMPH NODE BIOPSY;  Surgeon: Rolm Bookbinder, MD;  Location: [place];  Service: General;  Laterality: Left;   CARDIAC CATHETERIZATION   02/12/2014   [facility] in [place] D & C N/A 11/17/2020   Procedure: DILATATION AND CURETTAGE /HYSTEROSCOPY WITH MYOSURE;  Surgeon: Nunzio Cobbs, MD;  Location: [facility];  Service: Gynecology;  Laterality: N/A;   INTRAMEDULLARY (IM) NAIL INTERTROCHANTERIC Left 12/10/2015   Procedure: INTRAMEDULLARY (IM) NAIL INTERTROCHANTRIC;  Surgeon: Leandrew Koyanagi, MD;  Location: [place];  Service: Orthopedics;  Laterality: Left;   OPERATIVE ULTRASOUND N/A 11/17/2020   Procedure: OPERATIVE ULTRASOUND;  Surgeon: Nunzio Cobbs, MD;  Location: [facility];  Service: Gynecology;  Laterality: N/A;   PLEURADESIS Left 12/03/2015   Procedure: PLEURADESIS;  Surgeon: Melrose Nakayama, MD;  Location: [place];  Service: Thoracic;  Laterality: Left;   RESECTION OF APICAL BLEB Left 12/03/2015   Procedure: BLEBECTOMY;  Surgeon: Melrose Nakayama, MD;  Location: [place];  Service: Thoracic;  Laterality: Left;  RIGHT/LEFT HEART CATH AND CORONARY ANGIOGRAPHY N/A 03/10/2020   Procedure: RIGHT/LEFT HEART CATH AND CORONARY ANGIOGRAPHY;  Surgeon: [name], MD;  Location: [place] [facility];  Service: Cardiovascular;  Laterality: N/A;   VIDEO ASSISTED THORACOSCOPY Left 12/03/2015   Procedure: VIDEO ASSISTED THORACOSCOPY;  Surgeon: Melrose Nakayama, MD;  Location: [place];  Service: Thoracic;  Laterality: Left;   VIDEO ASSISTED THORACOSCOPY (VATS) W/TALC PLEUADESIS Right 11/19/2014   in [place]   Past Medical History:  Diagnosis Date   Anticoagulated on Coumadin    managed by cardiology   Anxiety    Chronic constipation    Chronic systolic CHF (congestive heart failure) ([place]) 2016   cardiologist--- dr end and followed by CHF clinic   CKD (chronic kidney disease), stage III ([place])    COPD (chronic obstructive pulmonary disease) ([place])    previous had seen pulmonology --- dr Melvyn Novas, note in epic 03-14-2016, dx COPD GOLD 0   Coronary artery  disease    cardiologist--- dr Durward Mallard;  last cardiac cath 01-10-20202 mild nonobstructive disease proxLAD;  per pt, had LHC prior to AVR in [place] 2016   DOE (dyspnea on exertion)    GERD (gastroesophageal reflux disease)    History of 2019 novel coronavirus disease (COVID-19) 12/19/2019   hospital admission w/ covid pneumonia , no intubatiion,  per pt symptoms resolved and back to baseline   History of cerebrovascular accident (CVA) with residual deficit    CVA in 2000 without residuals;  CVA post op AVR 01/ 2016 with residual right hand weakness   History of gastritis    History of pneumothorax    s/p right vats 11-19-2014 and  s/p left vats  12-13-2015   (both spontenaous due to bleb)   Hyperlipidemia    Hypertension    Hyperthyroidism    per pt followed by pcp,  dx approx 2014   Malignant neoplasm of upper-outer quadrant of left breast in female, estrogen receptor positive ([place]) 11/2016   oncologist-- dr Burr Medico--- dx 10/ 2018  ,  s/p left lumptectomy with node dissection;  completed radiation 04-15-2017, no chemo   Multiple thyroid nodules    in care everywhere pt had left thyroid nodule biospy 09-18-2014 benign   NICM (nonischemic cardiomyopathy) ([place]) 2016   2016  ef 30%;  last echo in epic ef 45%   OA (osteoarthritis)    Osteoporosis    Paroxysmal atrial fibrillation [facility])    cardiologist--- dr end   Personal history of radiation therapy    left breast cancer  03-08-2017  to 04-05-2017   Thrombocytopenia [facility])    followed by dr Burr Medico   Valvular stenosis, aortic 03/12/2014   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  Opioid Risk Score:   Fall Risk Score:  `1  Depression screen PHQ 2/9  Depression screen [facility] 2/9 09/19/2020 09/16/2020 08/13/2020 06/24/2020 06/17/2020 05/26/2020 05/16/2020  Decreased Interest 0 0 0 0 0 0 0  Down, Depressed, Hopeless 0 0 0 0 0 1 0  PHQ - 2 Score 0 0 0 0 0 1  0  Some recent data might be hidden    Review of Systems  Musculoskeletal:  Positive for gait problem.       Left knee pain  All other systems reviewed and are negative.     Objective:   Physical Exam Vitals and  nursing note reviewed.  Constitutional:      Appearance: She is obese.  HENT:     Head: Normocephalic and atraumatic.  Eyes:     Extraocular Movements: Extraocular movements intact.     Conjunctiva/sclera: Conjunctivae normal.     Pupils: Pupils are equal, round, and reactive to light.  Musculoskeletal:     Comments: Patient has mild tenderness palpation bilateral lumbar paraspinal area There is tenderness along the lateral greater than medial joint line on left knee medial present lateral joint line of the right knee.  No evidence of knee effusion no erythema no warmth has full knee extension knee flexion is limited to around 100 degrees bilaterally  Neurological:     Mental Status: She is alert and oriented to person, place, and time.     Comments: Motor strength is 5/5 bilateral hip flexor knee extensor ankle dorsiflexor Ambulates with a walker forward flexed posture.  No evidence of toe drag or knee instability   Psychiatric:        Mood and Affect: Mood normal.        Behavior: Behavior normal.          Assessment & Plan:   1.  Chronic low back pain reviewed imaging studies with the patient, main pain generator is likely the L4-5 disc space collapse no significant lower extremity radiating pain.  No long-term effect from lumbar medial branch block or radiofrequency neurotomy will not repeat.  Continue tramadol 50 mg 3 times daily 2.  Chronic bilateral knee pain (right.  Likely osteoarthritis we will check x-rays set her up for Synvisc 1 injection under ultrasound guidance.  Discussed with patient agrees with above plan  Neuro 3.  Progressive decline in function due to issues #1 #2, has tried aquatic therapy only 2 sessions and this was very helpful we will make  referral to [name] outpatient PT dropped reach for aquatic therapy specifically.

## 2020-12-01 ENCOUNTER — Ambulatory Visit (HOSPITAL_COMMUNITY)
Admission: RE | Admit: 2020-12-01 | Discharge: 2020-12-01 | Disposition: A | Discharge status: Home / Self Care | Payer: Medicare Other | Source: Ambulatory Visit | Attending: Cardiology | Admitting: Cardiology

## 2020-12-01 ENCOUNTER — Other Ambulatory Visit: Payer: Self-pay

## 2020-12-01 DIAGNOSIS — I514 Myocarditis, unspecified: Secondary | ICD-10-CM | POA: Diagnosis not present

## 2020-12-01 MED ORDER — GADOBUTROL 1 MMOL/ML IV SOLN
10.0000 mL | Freq: Once | INTRAVENOUS | Status: AC | PRN
  Administered 2020-12-01: 10 mL via INTRAVENOUS

## 2020-12-02 ENCOUNTER — Ambulatory Visit (INDEPENDENT_AMBULATORY_CARE_PROVIDER_SITE_OTHER): Payer: Medicare Other

## 2020-12-02 DIAGNOSIS — G8929 Other chronic pain: Secondary | ICD-10-CM

## 2020-12-02 DIAGNOSIS — M25562 Pain in left knee: Secondary | ICD-10-CM

## 2020-12-02 DIAGNOSIS — I10 Essential (primary) hypertension: Secondary | ICD-10-CM

## 2020-12-02 DIAGNOSIS — E782 Mixed hyperlipidemia: Secondary | ICD-10-CM

## 2020-12-02 DIAGNOSIS — I480 Paroxysmal atrial fibrillation: Secondary | ICD-10-CM

## 2020-12-02 DIAGNOSIS — I5022 Chronic systolic (congestive) heart failure: Secondary | ICD-10-CM

## 2020-12-02 NOTE — Chronic Care Management (AMB) (Signed)
Chronic Care Management   CCM RN Visit Note  12/02/2020 Name: Danielle [name] MRN: [id number] DOB: [birth year]  Subjective: Danielle [name] is a 75 y.o. year old female who is a primary care patient of [name], [name], MD. The care management team was consulted for assistance with disease management and care coordination needs.    Engaged with patient by telephone for follow up visit in response to provider referral for case management and/or care coordination services.   Consent to Services:  The patient was given information about Chronic Care Management services, agreed to services, and gave verbal consent prior to initiation of services.  Please see initial visit note for detailed documentation.   Patient agreed to services and verbal consent obtained.   Assessment: Review of patient past medical history, allergies, medications, health status, including review of consultants reports, laboratory and other test data, was performed as part of comprehensive evaluation and provision of chronic care management services.   SDOH (Social Determinants of Health) assessments and interventions performed:    CCM Care Plan  Allergies  Allergen Reactions   Lisinopril Cough   Tetanus Toxoid Adsorbed Swelling    Arm swelling   Other Cough    PEANUTS    Outpatient Encounter Medications as of 12/02/2020  Medication Sig Note   albuterol (PROVENTIL HFA;VENTOLIN HFA) 108 (90 Base) MCG/ACT inhaler Inhale 2 puffs into the lungs every 6 (six) hours as needed for wheezing or shortness of breath.    alendronate (FOSAMAX) 70 MG tablet Take 1 tablet (70 mg total) by mouth once a week. Take with a full glass of water on an empty stomach. (Patient taking differently: Take 70 mg by mouth once a week. Saturday's ; Take with a full glass of water on an empty stomach.)    ascorbic acid (VITAMIN C) 500 MG tablet Take 500 mg by mouth daily.    budesonide-formoterol (SYMBICORT) 160-4.5  MCG/ACT inhaler Inhale 2 puffs into the lungs daily as needed.    carvedilol (COREG) 12.5 MG tablet Take 1 tablet (12.5 mg total) by mouth 2 (two) times daily with a meal. (Patient taking differently: Take 12.5 mg by mouth 2 (two) times daily with a meal.)    Cholecalciferol 25 MCG (1000 UT) tablet Take 1,000 Units by mouth daily.    dapagliflozin propanediol (FARXIGA) 10 MG TABS tablet Take 1 tablet (10 mg total) by mouth daily before breakfast. (Patient taking differently: Take 10 mg by mouth daily before breakfast.)    enoxaparin (LOVENOX) 120 MG/0.8ML injection Inject 0.8 mLs (120 mg total) into the skin every 12 (twelve) hours. 11/13/2020: Per pt started am 11-12-2020   fluticasone (FLONASE) 50 MCG/ACT nasal spray Place 1 spray into both nostrils daily. (Patient taking differently: Place 1 spray into both nostrils daily as needed.)    methimazole (TAPAZOLE) 5 MG tablet Take 3 tablets (15 mg total) by mouth daily. (Patient taking differently: Take 15 mg by mouth at bedtime.)    pantoprazole (PROTONIX) 40 MG tablet Take 1 tablet (40 mg total) by mouth daily. (Patient taking differently: Take 40 mg by mouth daily.)    rosuvastatin (CRESTOR) 40 MG tablet TAKE 1 TABLET BY MOUTH  DAILY (Patient taking differently: Take 40 mg by mouth at bedtime.)    sacubitril-valsartan (ENTRESTO) 49-51 MG Take 1 tablet by mouth 2 (two) times daily.    senna-docusate (SENOKOT-S) 8.6-50 MG tablet Take 2 tablets by mouth at bedtime.    spironolactone (ALDACTONE) 25 MG tablet Take 0.5 tablets (12.5 mg total)  by mouth daily.    torsemide (DEMADEX) 20 MG tablet Take 1 tablet (20 mg total) by mouth daily.    traMADol (ULTRAM) 50 MG tablet Take 1 tablet (50 mg total) by mouth 3 (three) times daily. 11/27/2020: Last taken today  Last filled on  11/06/20 for # 90  On hand today # 64   traZODone (DESYREL) 50 MG tablet Take 0.5-1 tablets (25-50 mg total) by mouth at bedtime as needed for sleep.    trolamine salicylate  (ASPERCREME) 10 % cream Apply 1 application topically as needed for muscle pain.    valACYclovir (VALTREX) 500 MG tablet Take on tablet by mouth twice a day for 3 days as needed for an outbreak (Patient taking differently: Take 500 mg by mouth. Take on tablet by mouth twice a day for 3 days as needed for an outbreak)    warfarin (COUMADIN) 3 MG tablet Take 1 tablet daily or Take as directed by anticoagulation clinic 11/13/2020: Per pt last dose 11-11-2020 per instructions from cardiologist prior to surgery on 11-17-2020   No facility-administered encounter medications on file as of 12/02/2020.    Patient Active Problem List   Diagnosis Date Noted   Gait abnormality 07/31/2020   Paresthesia 07/31/2020   Low back pain with bilateral sciatica 07/31/2020   Pneumonia due to COVID-19 virus    Bacteremia due to Gram-positive bacteria 08/29/2018   Abscess    Cellulitis of left breast 08/28/2018   Cellulitis of left abdominal wall 08/28/2018   Encounter for routine gynecological examination 04/28/2018   Stage 3 chronic kidney disease ([place]) 09/27/2017   Recurrent genital herpes 04/13/2017   Chronic pain disorder 01/25/2017   Malignant neoplasm of upper-outer quadrant of left breast in female, estrogen receptor positive ([place]) 12/16/2016   Chronic knee pain 12/07/2016   Chronic bilateral low back pain without sciatica 12/07/2016   Peripheral musculoskeletal gait disorder 12/07/2016   Encounter for therapeutic drug monitoring 06/16/2016   Generalized osteoarthritis of multiple sites 05/04/2016   Chronic anticoagulation 05/04/2016   Stroke ([place]) 04/14/2016   Aortic valve disease 04/10/2016   S/P AVR 04/10/2016   NICM (nonischemic cardiomyopathy) ([place]) 04/10/2016   Upper airway cough syndrome 03/14/2016   Morbid obesity ([place]) 03/14/2016   COPD (chronic obstructive pulmonary disease) ([place]) 03/11/2016   Thrombocytopenia ([place]) 12/12/2015   Anemia 12/12/2015   Vitamin D deficiency 12/12/2015   Acute  urinary retention 12/11/2015   Osteoporosis 12/11/2015   Hip fracture ([place]) 12/10/2015   Aortic atherosclerosis ([place]) 12/10/2015   Lung blebs ([place]) 12/03/2015   Pelvic fracture ([place]) 08/19/2015   Fall at home 08/19/2015   Essential hypertension 08/19/2015   Mixed hyperlipidemia 08/19/2015   Asthma 08/19/2015   Paroxysmal atrial fibrillation ([place]) 08/19/2015   Hyperthyroidism 08/19/2015   Chronic HFrEF (heart failure with reduced ejection fraction) ([facility])    Coronary artery disease    Aortic stenosis     Conditions to be addressed/monitored:Atrial Fibrillation, CHF, CAD, HTN, HLD, CKD Stage 3, and chronic pain  Care Plan : RNCM: Cardiovascular Disease Management( Heart Failure, HTN, paroxysmal atrial fibrillation)  Updates made by Dimitri Ped, RN since 12/02/2020 12:00 AM     Problem: Lack of long term management of Cardiovascular Disease( Heart Failure, HTN, paroxysmal atrial fibrillation)   Priority: Medium     Long-Range Goal: Long term self mangement of Cardiovascular Disease Management( Heart Failure, HTN, paroxysmal atrial fibrillation)   Start Date: 06/24/2020  Expected End Date: 04/29/2021  This Visit's Progress: On track  Recent Progress: On track  Priority: Medium  Note:   Current Barriers:  Knowledge deficits related to basic heart failure pathophysiology and self care management of Cardiovascular Disease Management( Heart Failure, HTN, paroxysmal atrial fibrillation)  Does not adhere to provider recommendations re: low sodium diet at times  States that the torsemide is working better to help her keep her fluid off and she is taking the [place] as ordered.  Pt reports weighting daily and her weight was 240 today denies any chest pains, reports her swelling in legs has improved.  Reports she wears compression hose but not always, reports shortness of breath with exertion,  denies any recent atrial fibrillation, reports she has her blood checked regularly for her  Coumadin, States she is checking her B/P 4-5 times a week and it was 112/74 when she last checked it. States she tries to follow a low sodium diet and the fluid restriction that the heart failure clinic gave her.  States she had surgery for  vaginal bleeding and Dr. [name] took her off of the tamoxifen.  States she had an MRI of her heart yesterday   Nurse Case Manager Clinical Goal(s):  patient will weigh self daily and record patient will verbalize understanding of Heart Failure Action Plan and when to call doctor patient will take all Heart Failure mediations as prescribed Interventions:  Collaboration with [name], [name], MD regarding development and update of comprehensive plan of care as evidenced by provider attestation and co-signature Inter-disciplinary care team collaboration (see longitudinal plan of care) Reveiwed basic overview and discussion of pathophysiology of Heart Failure Reinforced to follow a  low sodium diet and fluid restrictions Reinforced Heart Failure Action Plan  Reinforced importance of daily weight Reinforced role of diuretics in prevention of fluid overload Reviewed s/sx of bleeding to call provider and reviewed foods to avoid while on Coumadin Reviewed upcoming provider appointments Dr. [name] 12/08/20, Heart failure clinic 12/17/20, Dr. Saunders Revel cardiology 05/20/21, CCM PharmD 01/02/21 Self-Care Activities:  Takes Heart Failure Medications as prescribed Weighs daily and record (notifying MD of 3 lb weight gain over night or 5 lb in a week)  Patient Goals:  - Take Heart Failure Medications as prescribed - Weigh daily and record (notify MD with 3 lb weight gain over night or 5 lb in a week) - Follow CHF Action Plan - Adhere to low sodium diet - begin a heart failure diary - bring diary to all appointments - develop a rescue plan - eat more whole grains, fruits and vegetables, lean meats and healthy fats - follow rescue plan if symptoms flare-up - know when to call  the doctor - follow activity or exercise plan - make an activity or exercise plan - meet with physical therapist - pace activity allowing for rest - do ankle pumps when sitting - keep legs up while sitting - track weight in diary - use salt in moderation - watch for swelling in feet, ankles and legs every day -Calls provider office for new concerns, questions, or BP outside discussed parameters -Follows a low sodium diet/DASH diet -check blood pressure 3 times per week - write blood pressure and weight results in a log or diary  -Plan to fill your medication box weekly  Follow Up Plan: Telephone follow up appointment with care management team member scheduled for:  01/15/21 at 3:30 PM The patient has been provided with contact information for the care management team and has been advised to call with any health related questions or  concerns.      Care Plan : RNCM:Chronic Pain (Adult)  Updates made by Dimitri Ped, RN since 12/02/2020 12:00 AM     Problem: Chronic Pain Management (Chronic Pain)   Priority: Medium     Long-Range Goal: Chronic Pain Managed   Start Date: 06/24/2020  Expected End Date: 04/29/2021  This Visit's Progress: On track  Recent Progress: On track  Priority: Medium  Note:   Current Barriers:  Knowledge Deficits related to self-health management of acute or chronic pain of lower back due to degenerative disc disease  Chronic Disease Management support and education needs related to chronic pain Chronic Disease Management support and education needs related to chronic back pain Does not adhere to provider recommendations re: exercise Unable to perform IADLs independently uses walker  Pt states she is to have injections in her knees to help with the pain.  States she is to go Monday to be evaluated for water PT for her knees.  States she would like to get strong enough to only need a cane and be able to help her husband around the house more. states she is  taking her Tramadol as ordered.  Denies any falls.  States she uses her walker in her home but uses a wheelchair when she goes to appointments Clinical Goal(s):  patient will verbalize understanding of plan for pain management. , patient will attend all scheduled medical appointments:  Dr. Read Drivers 01/27/21, PT at [facility] 12/08/20, patient will demonstrate use of different relaxation  skills and/or diversional activities to assist with pain reduction (distraction, imagery, relaxation, massage, acupressure, TENS, heat, and cold application., patient will report pain at a level less than 3 to 4 on a 10-10 rating scale., patient will use pharmacological and nonpharmacological pain relief strategies as prescribed. , patient will verbalize acceptable level of pain relief and ability to engage in desired activities, and patient will engage in desired activities without an increase in pain level Interventions:  Collaboration with [name], [name], MD regarding development and update of comprehensive plan of care as evidenced by provider attestation and co-signature Pain assessment performed Medications reviewed Reinforced on methods of chronic pain control  Discussed plans with patient for ongoing care management follow up and provided patient with direct contact information for care management team Reviewed scheduled/upcoming provider appointments including:  Dr. Read Drivers 01/27/21, [facility] 12/08/20 Reinforced benefits of exercise and therapy to help with chronic pain and mobility  Patient Goals/Self Care Activities:  Will self-administer medications as prescribed Will attend all scheduled provider appointments Will call pharmacy for medication refills 7 days prior to needed refill date Patient will calls provider office for new concerns or questions  learn how to meditate learn relaxation techniques practice acceptance of chronic pain  practice relaxation or meditation daily  tell myself I  can (not I can't) think of new ways to do favorite things  use distraction techniques  use relaxation during pain Follow Up Plan: Telephone follow up appointment with care management team member scheduled for: 01/15/21 at 3:30 PM The patient has been provided with contact information for the care management team and has been advised to call with any health related questions or concerns.        Plan:Telephone follow up appointment with care management team member scheduled for:  01/15/21 and The patient has been provided with contact information for the care management team and has been advised to call with any health related questions or concerns.  Peter Garter RN,  [facility], CDE Care Management Coordinator [facility] [phone], Mobile [phone]

## 2020-12-02 NOTE — Patient Instructions (Signed)
Visit Information  PATIENT GOALS:  Goals Addressed             This Visit's Progress    RNCM-Track and Manage Symptoms of cardiovascular disease(CHF, HTN,atrial fibrillation)   On track    Timeframe:  Long-Range Goal Priority:  Medium Start Date:     06/24/20                        Expected End Date:     04/29/21 Follow up date: 01/15/21                        - begin a heart failure diary - develop a rescue plan - eat more whole grains, fruits and vegetables, lean meats and healthy fats - follow rescue plan if symptoms flare-up - know when to call the doctor - track symptoms and what helps feel better or worse  -Call provider office for new concerns, questions, or BP outside discussed parameters -Follow a low sodium diet/DASH diet -check blood pressure 3 times per week - write blood pressure and weight results in a log or diary  -Plan to fill your medication box weekly  Why is this important?   You will be able to handle your symptoms better if you keep track of them.  Making some simple changes to your lifestyle will help.  Eating healthy is one thing you can do to take good care of yourself.       RNCM:Cope with Chronic Pain   On track    Timeframe:  Long-Range Goal Priority:  Medium Start Date:      06/24/20                       Expected End Date:        04/29/21              Follow Up Date 01/15/21    - learn how to meditate - learn relaxation techniques - practice acceptance of chronic pain - practice relaxation or meditation daily - tell myself I can (not I can't) - think of new ways to do favorite things - use distraction techniques - use relaxation during pain    Why is this important?   Stress makes chronic pain feel worse.  Feelings like depression, anxiety, stress and anger can make your body more sensitive to pain.  Learning ways to cope with stress or depression may help you find some relief from the pain.     Notes:         Patient verbalizes  understanding of instructions provided today and agrees to view in [place].   Telephone follow up appointment with care management team member scheduled for: 01/15/21 3:30 PM  Peter Garter RN, [facility], CDE Care Management Coordinator  [facility] [phone], Mobile [phone]

## 2020-12-08 ENCOUNTER — Ambulatory Visit (HOSPITAL_BASED_OUTPATIENT_CLINIC_OR_DEPARTMENT_OTHER): Payer: Medicare Other | Attending: Neurology | Admitting: Physical Therapy

## 2020-12-08 ENCOUNTER — Other Ambulatory Visit: Payer: Self-pay

## 2020-12-08 ENCOUNTER — Ambulatory Visit (INDEPENDENT_AMBULATORY_CARE_PROVIDER_SITE_OTHER): Payer: Medicare Other | Admitting: Family Medicine

## 2020-12-08 ENCOUNTER — Encounter: Payer: Self-pay | Admitting: Family Medicine

## 2020-12-08 VITALS — BP 130/80 | HR 85 | Resp 16 | Ht 67.0 in

## 2020-12-08 DIAGNOSIS — Z23 Encounter for immunization: Secondary | ICD-10-CM

## 2020-12-08 DIAGNOSIS — E0590 Thyrotoxicosis, unspecified without thyrotoxic crisis or storm: Secondary | ICD-10-CM | POA: Diagnosis not present

## 2020-12-08 DIAGNOSIS — R262 Difficulty in walking, not elsewhere classified: Secondary | ICD-10-CM | POA: Diagnosis not present

## 2020-12-08 DIAGNOSIS — G4700 Insomnia, unspecified: Secondary | ICD-10-CM | POA: Diagnosis not present

## 2020-12-08 DIAGNOSIS — K5900 Constipation, unspecified: Secondary | ICD-10-CM | POA: Diagnosis not present

## 2020-12-08 DIAGNOSIS — R350 Frequency of micturition: Secondary | ICD-10-CM | POA: Diagnosis not present

## 2020-12-08 DIAGNOSIS — M159 Polyosteoarthritis, unspecified: Secondary | ICD-10-CM

## 2020-12-08 DIAGNOSIS — R293 Abnormal posture: Secondary | ICD-10-CM | POA: Insufficient documentation

## 2020-12-08 DIAGNOSIS — M25561 Pain in right knee: Secondary | ICD-10-CM | POA: Diagnosis not present

## 2020-12-08 DIAGNOSIS — F321 Major depressive disorder, single episode, moderate: Secondary | ICD-10-CM

## 2020-12-08 DIAGNOSIS — Z1211 Encounter for screening for malignant neoplasm of colon: Secondary | ICD-10-CM | POA: Diagnosis not present

## 2020-12-08 DIAGNOSIS — M25562 Pain in left knee: Secondary | ICD-10-CM | POA: Insufficient documentation

## 2020-12-08 DIAGNOSIS — G8929 Other chronic pain: Secondary | ICD-10-CM | POA: Diagnosis not present

## 2020-12-08 DIAGNOSIS — M545 Low back pain, unspecified: Secondary | ICD-10-CM | POA: Diagnosis not present

## 2020-12-08 DIAGNOSIS — M6281 Muscle weakness (generalized): Secondary | ICD-10-CM | POA: Diagnosis not present

## 2020-12-08 DIAGNOSIS — F331 Major depressive disorder, recurrent, moderate: Secondary | ICD-10-CM | POA: Insufficient documentation

## 2020-12-08 LAB — URINALYSIS, ROUTINE W REFLEX MICROSCOPIC
Bilirubin Urine: NEGATIVE
Hgb urine dipstick: NEGATIVE
Ketones, ur: NEGATIVE
Leukocytes,Ua: NEGATIVE
Nitrite: NEGATIVE
RBC / HPF: NONE SEEN (ref 0–?)
Specific Gravity, Urine: 1.01 (ref 1.000–1.030)
Urine Glucose: 500 — AB
Urobilinogen, UA: 0.2 (ref 0.0–1.0)
pH: 6 (ref 5.0–8.0)

## 2020-12-08 LAB — POCT GLYCOSYLATED HEMOGLOBIN (HGB A1C): Hemoglobin A1C: 5.1 % (ref 4.0–5.6)

## 2020-12-08 LAB — TSH: TSH: 0.53 u[IU]/mL (ref 0.35–5.50)

## 2020-12-08 MED ORDER — DULOXETINE HCL 30 MG PO CPEP: 30.0000 mg | ORAL_CAPSULE | Freq: Every day | ORAL | 1 refills | Status: DC

## 2020-12-08 MED ORDER — LINACLOTIDE 145 MCG PO CAPS: 145.0000 ug | ORAL_CAPSULE | Freq: Every day | ORAL | 1 refills | Status: DC

## 2020-12-08 NOTE — Therapy (Signed)
OUTPATIENT PHYSICAL THERAPY LOWER EXTREMITY EVALUATION   Patient Name: Danielle [name] MRN: [id number] DOB:1946/01/01, 75 y.o., female Today's Date: 12/08/2020   PT End of Session - 12/08/20 1030     Visit Number 1    Number of Visits 12    Date for PT Re-Evaluation 01/19/21    Authorization Type [place] Time 5102    PT Stop Time 5852    PT Time Calculation (min) 43 min    Activity Tolerance Patient limited by fatigue    Behavior During Therapy WFL for tasks assessed/performed             Past Medical History:  Diagnosis Date   Anticoagulated on Coumadin    managed by cardiology   Anxiety    Chronic constipation    Chronic systolic CHF (congestive heart failure) ([place]) 2016   cardiologist--- dr end and followed by CHF clinic   CKD (chronic kidney disease), stage III ([place])    COPD (chronic obstructive pulmonary disease) ([place])    previous had seen pulmonology --- dr Melvyn Novas, note in epic 03-14-2016, dx COPD GOLD 0   Coronary artery disease    cardiologist--- dr Durward Mallard;  last cardiac cath 01-10-20202 mild nonobstructive disease proxLAD;  per pt, had LHC prior to AVR in [place] 2016   DOE (dyspnea on exertion)    GERD (gastroesophageal reflux disease)    History of 2019 novel coronavirus disease (COVID-19) 12/19/2019   hospital admission w/ covid pneumonia , no intubatiion,  per pt symptoms resolved and back to baseline   History of cerebrovascular accident (CVA) with residual deficit    CVA in 2000 without residuals;  CVA post op AVR 01/ 2016 with residual right hand weakness   History of gastritis    History of pneumothorax    s/p right vats 11-19-2014 and  s/p left vats  12-13-2015   (both spontenaous due to bleb)   Hyperlipidemia    Hypertension    Hyperthyroidism    per pt followed by pcp,  dx approx 2014   Malignant neoplasm of upper-outer quadrant of left breast in female, estrogen receptor positive ([place]) 11/2016   oncologist-- dr Burr Medico--- dx  10/ 2018  ,  s/p left lumptectomy with node dissection;  completed radiation 04-15-2017, no chemo   Multiple thyroid nodules    in care everywhere pt had left thyroid nodule biospy 09-18-2014 benign   NICM (nonischemic cardiomyopathy) ([place]) 2016   2016  ef 30%;  last echo in epic ef 45%   OA (osteoarthritis)    Osteoporosis    Paroxysmal atrial fibrillation [facility])    cardiologist--- dr end   Personal history of radiation therapy    left breast cancer  03-08-2017  to 04-05-2017   Thrombocytopenia [facility])    followed by dr Burr Medico   Valvular stenosis, aortic 03/12/2014   Status post bioprosthetic AVR  in [place]  for severe AS   Wears dentures    full upper   Past Surgical History:  Procedure Laterality Date   AORTIC VALVE REPLACEMENT  03/12/2014   [facility] [place];  St Jude 59mm, medial Trifecta Bioprosthesis   BREAST LUMPECTOMY WITH RADIOACTIVE SEED AND SENTINEL LYMPH NODE BIOPSY Left 01/14/2017   Procedure: LEFT BREAST LUMPECTOMY WITH RADIOACTIVE SEED AND SENTINEL LYMPH NODE BIOPSY;  Surgeon: Rolm Bookbinder, MD;  Location: [place] [facility];  Service: General;  Laterality: Left;   CARDIAC CATHETERIZATION  02/12/2014   [facility] in [place]  HYSTEROSCOPY WITH D & C N/A 11/17/2020   Procedure: DILATATION AND CURETTAGE /HYSTEROSCOPY WITH MYOSURE;  Surgeon: Nunzio Cobbs, MD;  Location: [facility];  Service: Gynecology;  Laterality: N/A;   INTRAMEDULLARY (IM) NAIL INTERTROCHANTERIC Left 12/10/2015   Procedure: INTRAMEDULLARY (IM) NAIL INTERTROCHANTRIC;  Surgeon: Leandrew Koyanagi, MD;  Location: [place];  Service: Orthopedics;  Laterality: Left;   OPERATIVE ULTRASOUND N/A 11/17/2020   Procedure: OPERATIVE ULTRASOUND;  Surgeon: Nunzio Cobbs, MD;  Location: [facility];  Service: Gynecology;  Laterality: N/A;   PLEURADESIS Left 12/03/2015   Procedure: PLEURADESIS;  Surgeon: Melrose Nakayama, MD;  Location: [place];  Service: Thoracic;   Laterality: Left;   RESECTION OF APICAL BLEB Left 12/03/2015   Procedure: BLEBECTOMY;  Surgeon: Melrose Nakayama, MD;  Location: [place];  Service: Thoracic;  Laterality: Left;   RIGHT/LEFT HEART CATH AND CORONARY ANGIOGRAPHY N/A 03/10/2020   Procedure: RIGHT/LEFT HEART CATH AND CORONARY ANGIOGRAPHY;  Surgeon: [name], MD;  Location: [place] [facility];  Service: Cardiovascular;  Laterality: N/A;   VIDEO ASSISTED THORACOSCOPY Left 12/03/2015   Procedure: VIDEO ASSISTED THORACOSCOPY;  Surgeon: Melrose Nakayama, MD;  Location: [place];  Service: Thoracic;  Laterality: Left;   VIDEO ASSISTED THORACOSCOPY (VATS) W/TALC PLEUADESIS Right 11/19/2014   in [place]   Patient Active Problem List   Diagnosis Date Noted   Gait abnormality 07/31/2020   Paresthesia 07/31/2020   Low back pain with bilateral sciatica 07/31/2020   Pneumonia due to COVID-19 virus    Bacteremia due to Gram-positive bacteria 08/29/2018   Abscess    Cellulitis of left breast 08/28/2018   Cellulitis of left abdominal wall 08/28/2018   Encounter for routine gynecological examination 04/28/2018   Stage 3 chronic kidney disease ([place]) 09/27/2017   Recurrent genital herpes 04/13/2017   Chronic pain disorder 01/25/2017   Malignant neoplasm of upper-outer quadrant of left breast in female, estrogen receptor positive ([place]) 12/16/2016   Chronic knee pain 12/07/2016   Chronic bilateral low back pain without sciatica 12/07/2016   Peripheral musculoskeletal gait disorder 12/07/2016   Encounter for therapeutic drug monitoring 06/16/2016   Generalized osteoarthritis of multiple sites 05/04/2016   Chronic anticoagulation 05/04/2016   Stroke ([place]) 04/14/2016   Aortic valve disease 04/10/2016   S/P AVR 04/10/2016   NICM (nonischemic cardiomyopathy) ([place]) 04/10/2016   Upper airway cough syndrome 03/14/2016   Morbid obesity ([place]) 03/14/2016   COPD (chronic obstructive pulmonary disease) ([place]) 03/11/2016    Thrombocytopenia ([place]) 12/12/2015   Anemia 12/12/2015   Vitamin D deficiency 12/12/2015   Acute urinary retention 12/11/2015   Osteoporosis 12/11/2015   Hip fracture ([place]) 12/10/2015   Aortic atherosclerosis ([place]) 12/10/2015   Lung blebs ([place]) 12/03/2015   Pelvic fracture ([place]) 08/19/2015   Fall at home 08/19/2015   Essential hypertension 08/19/2015   Mixed hyperlipidemia 08/19/2015   Asthma 08/19/2015   Paroxysmal atrial fibrillation ([place]) 08/19/2015   Hyperthyroidism 08/19/2015   Chronic HFrEF (heart failure with reduced ejection fraction) ([place])    Coronary artery disease    Aortic stenosis     PCP: [name], [name], MD  REFERRING PROVIDER: Dr Jone Baseman  REFERRING DIAG:  [phone] (ICD-10-CM) - Spondylosis without myelopathy or radiculopathy, lumbar region  M25.561,M25.562,G89.29 (ICD-10-CM) - Chronic pain of both knees    THERAPY DIAG:  Low Back Pain Bilateral Knee Pain L>R   ONSET DATE: Several years   SUBJECTIVE:   SUBJECTIVE STATEMENT: Patient has  a long history of low back and knee pain. Her left knee is worse then her right. Recently her left knee has been limiting her ability to walk. She uses a walker at home. She is limited to household distances for mobility. She can stand for about 5-10 minutes before her back starts to hurt significantly. She has had PT before. She felt like it helped a little bit. She felt like water therapy helped the most, but she only did 2 visits.   PERTINENT HISTORY: OA of the knees and back, anxiety, CHF, COPD, chronic constipation  PAIN:  Are you having pain? No VAS scale: 5/10 Pain location: Knee Pain orientation: Left  PAIN TYPE: aching Pain description: intermittent ( with movement and walking)  Aggravating factors: Standing, walking  Relieving factors: Tramadol, Voltaren   Are you having pain? Yes VAS scale: 9/10 Pain location: Back  Pain orientation: Lower  PAIN TYPE: aching Pain description: intermittent   Aggravating factors: Standing  Relieving factors: Not standing   PRECAUTIONS: None  WEIGHT BEARING RESTRICTIONS No  FALLS:  Has patient fallen in last 6 months? No, Number of falls: Has not had a fall for a few years   LIVING ENVIRONMENT: Lives with: lives with their family Lives in: House/apartment Stairs: No; Has following equipment at home: Environmental consultant - 2 wheeled, wheelchair   OCCUPATION: retired   PLOF: Independent with household mobility with device Would like to be able to walk again   PATIENT GOALS  To be able to walk again     OBJECTIVE:   DIAGNOSTIC FINDINGS: Lumbar MRI 2019: : 1. L4-5 advanced disc degeneration with disc collapse and ridging causing moderate left foraminal stenosis. 2. L5-S1 shallow left paracentral disc protrusion with noncompressive mass effect on the descending left S1 nerve root. 3. 3 cm left ovarian cyst, size stable from 2017 pelvis CT.: 1. L4-5 advanced disc degeneration with disc collapse and ridging causing moderate left foraminal stenosis. 2. L5-S1 shallow left paracentral disc protrusion with noncompressive mass effect on the descending left S1 nerve root. 3. 3 cm left ovarian cyst, size stable from 2017 pelvis CT.  COGNITION:  Overall cognitive status: Within functional limits for tasks assessed     SENSATION:  Light touch: Appears intact  Stereognosis: Appears intact  Hot/Cold: Appears intact  Proprioception: Appears intact  Patient has had numbness in her hands for about 6 months to a year    POSTURE:  Sits in sluched posture; rounded sholders, forward head, leaning backwards  LE AROM/PROM: Unable to lie on her back for formal hip testing but appears to have limited active hip flexion bilateral  LE MMT:  MMT Right 12/08/2020 Left 12/08/2020  Hip flexion 3/5 3/5  Hip extension    Hip abduction 3/5 3/5  Hip adduction    Hip internal rotation    Hip external rotation    Knee flexion 4/5 4/5  Knee extension 4/5 4/5     Ankle dorsiflexion    Ankle plantarflexion    Ankle inversion    Ankle eversion     (Blank rows = not tested)    FUNCTIONAL TESTS:   Patient unable to lie flat because of her breathing   Sit to stand: needs to use hands           Stand to sit: uses hands to control decent            5x sit to stand test: 29 sec   GAIT: Distance walked: 30' total  Assistive  device utilized: Walker - 2 wheeled Level of assistance: SBA Comments: Patient ambulated 82' before becoming short of breath and reporting low back pain. HR measured at 51 bpm and Sao2 at 96%. She recovered after a 1 min seated rest break.     TODAY'S TREATMENT:    PATIENT EDUCATION:  Education details: reviewed POC and HEP  Person educated: Patient Education method: Explanation, Demonstration, Tactile cues, and Verbal cues Education comprehension: verbalized understanding, returned demonstration, verbal cues required, and tactile cues required   HOME EXERCISE PROGRAM: Patient has an established HEP from previous visits. Patient advised to continue with this daily for now   ASSESSMENT:  CLINICAL IMPRESSION: Patient is a 75 year old female with a significant history of bilateral knee and low back pain. Her low back pain increases when she stands for short periods of time. It is limiting her ability to perform ADL's and household chores. Her left knee is worse then her right. Her knee hurts when she moves it, stands, and walks. She has limited endurance. She ambulated 26' with a RW before requiring a seated rest break with moderate dyspnea noted. Her sit to stand time puts her at a fall risk. She has had therapy in the past with minimal gain. Her last episode she had water therapy 2x and she felt that was the most helpful> She would like to trail a more long term pool program.    Abnormal gait, decreased activity tolerance, decreased balance, decreased endurance, decreased knowledge of use of DME, decreased mobility,  difficulty walking, decreased ROM, decreased strength, increased fascial restrictions, increased muscle spasms, improper body mechanics, obesity, and pain  cleaning, community activity, meal prep, laundry, shopping, and yard work  Age, Past/current experiences, and 3+ comorbidities: anxiety, COPD, OA in multi joints  REHAB POTENTIAL: Good  CLINICAL DECISION MAKING: Evolving/moderate complexity decreasing ability to walk   EVALUATION COMPLEXITY: Moderate   GOALS: Goals reviewed with patient? No  SHORT TERM GOALS:  STG Name Target Date Goal status  1 Patient will ambulate 73' without significant increase in lower back pain and dyspnea  Baseline:  01/05/2021 INITIAL  2 Patient will decrease 5x sit to stand test to 20 sec  Baseline:  01/05/2021 INITIAL  3 Patient will increase bilateral hip flexion to 4/5  Baseline: 01/05/2021 INITIAL   LONG TERM GOALS:   LTG Name Target Date Goal status  1 Patient will be independent with complete gym and pool program and figure out access to a pool for long term program  Baseline: 02/02/2021 INITIAL  2 Patient will ambulate 100' without significant lower back pain in order to imrove ability to walk in the community.  Baseline: 02/02/2021 INITIAL  3 Patient will stand for 20 min without increased low back and knee pain in order to do household chores  Baseline: 02/02/2021 INITIAL  PLAN: PT FREQUENCY: 2x/week  PT DURATION: 8 weeks  PLANNED INTERVENTIONS: Therapeutic exercises, Therapeutic activity, Neuro Muscular re-education, Balance training, Gait training, Patient/Family education, Joint mobilization, DME instructions, Aquatic Therapy, Dry Needling, Electrical stimulation, Cryotherapy, Moist heat, Taping, Ultrasound, and Manual therapy  PLAN FOR NEXT SESSION: begin basic hip and core exercises in the water. Work on walking endurance in the water. Work on ambulation with proper posture in the water.    Carney Living PT DPT  12/08/2020, 12:14 PM

## 2020-12-08 NOTE — Progress Notes (Addendum)
Chief Complaint  Patient presents with   Follow-up   HPI: [name] is a 75 y.o. female with hx of vit D def,hyperthyroidism,HFrEF,atrial fib, CKD III, and aortic valve disease s/p AVR on chronic anticoagulation here today with her husband c/o insomnia,depressed mood,and abdominal bloating sensation.  She "cannot sleep, cannot eat ." Stomach "makes noises",intermittent cramps, bloated,and nauseated. GI symptoms stared about 2 weeks ago. Symptoms exacerbated by food intake. No alleviating factors identified.  Constipation,she has to strain. Miralax does not help. Negative for vomiting,melena, and blood in stool. Having bowel movement q 1-2 days, small.  She is not sure about last colonoscopy, over 5 years.  HFrEF: She is on Farxiga 10 mg daily, she thinks it is causing urinary frequency. She is concerned about diabetes, would like to have lab done today. Negative for polydipsia and polyphagia. Also on Carvedilol,spironolactone,and Entresto.  Lab Results  Component Value Date   HGBA1C 5.3 07/31/2020   Her Spironolactone dose was decreased because abnormal BMP,per pt report. LE edema has resolved. She is also on Torsemide 20 mg daily.  She has an appt with her cardiologist on 12/17/20.  Lab Results  Component Value Date   CREATININE 1.40 (H) 11/21/2020   BUN 19 11/21/2020   NA 138 11/21/2020   K 4.9 11/21/2020   CL 108 11/21/2020   CO2 23 11/21/2020   She recently had gyn procedure, D&C and hysteroscopy on 11/17/20. Negative for gross hematuria or dysuria.  She was last seen on 11/19/20, when she was c/o insomnia. Trazodone is not helping, has tried up to 100 mg. Sleeping about 2 hours.  Hyperthyroidism on Methimazole 15 mg daily. Negative for abnormal wt loss,tremor, or palpitations.  Lab Results  Component Value Date   TSH 0.57 07/02/2020   Lab Results  Component Value Date   WBC 6.3 11/21/2020   HGB 10.9 (L) 11/21/2020   HCT 33.6  (L) 11/21/2020   MCV 96.3 11/21/2020   PLT 70 (L) 11/21/2020   Depressed mood. She denies prior hx but has had positive PHQ screening. Her sister died almost a year ago and this has been a contributing to her mood changes. She has not taken medication for depression before.  Depression screen [facility] 2/9 12/08/2020 11/27/2020 09/19/2020 09/16/2020 08/13/2020  Decreased Interest 3 0 0 0 0  Down, Depressed, Hopeless 3 0 0 0 0  PHQ - 2 Score 6 0 0 0 0  Altered sleeping 3 - - - -  Tired, decreased energy 3 - - - -  Change in appetite 3 - - - -  Feeling bad or failure about yourself  3 - - - -  Trouble concentrating 2 - - - -  Moving slowly or fidgety/restless 3 - - - -  Suicidal thoughts 0 - - - -  PHQ-9 Score 23 - - - -  Difficult doing work/chores Somewhat difficult - - - -  Some recent data might be hidden   GAD 7 : Generalized Anxiety Score 12/08/2020  Nervous, Anxious, on Edge 2  Control/stop worrying 3  Worry too much - different things 3  Trouble relaxing 3  Restless 2  Easily annoyed or irritable 0  Afraid - awful might happen 2  Total GAD 7 Score 15  Anxiety Difficulty Somewhat difficult     Chronic pain: Back pain and generalized OA. She is in a wheel chair, uses a walker at home. he is on Tramadol 50 mg tid prn, she takes it daily prn.  Next appt with pain management next month.  Review of Systems  Constitutional:  Positive for appetite change and fatigue. Negative for activity change and fever.  HENT:  Negative for mouth sores, nosebleeds and sore throat.   Respiratory:  Negative for cough, shortness of breath and wheezing.   Cardiovascular:  Negative for chest pain and leg swelling.  Endocrine: Negative for cold intolerance and heat intolerance.  Genitourinary:  Negative for decreased urine volume and difficulty urinating.  Musculoskeletal:  Positive for arthralgias, back pain and gait problem.  Allergic/Immunologic: Positive for environmental allergies.   Neurological:  Negative for syncope, weakness and headaches.  Psychiatric/Behavioral:  Positive for sleep disturbance. Negative for confusion. The patient is nervous/anxious.   Rest see pertinent positives and negatives per HPI.  Current Outpatient Medications on File Prior to Visit  Medication Sig Dispense Refill   albuterol (PROVENTIL HFA;VENTOLIN HFA) 108 (90 Base) MCG/ACT inhaler Inhale 2 puffs into the lungs every 6 (six) hours as needed for wheezing or shortness of breath.     alendronate (FOSAMAX) 70 MG tablet Take 1 tablet (70 mg total) by mouth once a week. Take with a full glass of water on an empty stomach. (Patient taking differently: Take 70 mg by mouth once a week. Saturday's ; Take with a full glass of water on an empty stomach.) 13 tablet 3   ascorbic acid (VITAMIN C) 500 MG tablet Take 500 mg by mouth daily.     budesonide-formoterol (SYMBICORT) 160-4.5 MCG/ACT inhaler Inhale 2 puffs into the lungs daily as needed. 3 each 3   carvedilol (COREG) 12.5 MG tablet Take 1 tablet (12.5 mg total) by mouth 2 (two) times daily with a meal. (Patient taking differently: Take 12.5 mg by mouth 2 (two) times daily with a meal.) 180 tablet 0   Cholecalciferol 25 MCG (1000 UT) tablet Take 1,000 Units by mouth daily.     dapagliflozin propanediol (FARXIGA) 10 MG TABS tablet Take 1 tablet (10 mg total) by mouth daily before breakfast. (Patient taking differently: Take 10 mg by mouth daily before breakfast.) 30 tablet 3   enoxaparin (LOVENOX) 120 MG/0.8ML injection Inject 0.8 mLs (120 mg total) into the skin every 12 (twelve) hours. 15.2 mL 0   fluticasone (FLONASE) 50 MCG/ACT nasal spray Place 1 spray into both nostrils daily. (Patient taking differently: Place 1 spray into both nostrils daily as needed.) 16 g 3   methimazole (TAPAZOLE) 5 MG tablet Take 3 tablets (15 mg total) by mouth daily. (Patient taking differently: Take 15 mg by mouth at bedtime.) 270 tablet 2   pantoprazole (PROTONIX) 40 MG  tablet Take 1 tablet (40 mg total) by mouth daily. (Patient taking differently: Take 40 mg by mouth daily.) 30 tablet 11   rosuvastatin (CRESTOR) 40 MG tablet TAKE 1 TABLET BY MOUTH  DAILY (Patient taking differently: Take 40 mg by mouth at bedtime.) 90 tablet 3   sacubitril-valsartan (ENTRESTO) 49-51 MG Take 1 tablet by mouth 2 (two) times daily.     senna-docusate (SENOKOT-S) 8.6-50 MG tablet Take 2 tablets by mouth at bedtime.     spironolactone (ALDACTONE) 25 MG tablet Take 0.5 tablets (12.5 mg total) by mouth daily. 45 tablet 3   torsemide (DEMADEX) 20 MG tablet Take 1 tablet (20 mg total) by mouth daily. 90 tablet 3   traMADol (ULTRAM) 50 MG tablet Take 1 tablet (50 mg total) by mouth 3 (three) times daily. 90 tablet 5   trolamine salicylate (ASPERCREME) 10 % cream Apply 1 application  topically as needed for muscle pain.     valACYclovir (VALTREX) 500 MG tablet Take on tablet by mouth twice a day for 3 days as needed for an outbreak (Patient taking differently: Take 500 mg by mouth. Take on tablet by mouth twice a day for 3 days as needed for an outbreak) 30 tablet 0   warfarin (COUMADIN) 3 MG tablet Take 1 tablet daily or Take as directed by anticoagulation clinic 90 tablet 1   No current facility-administered medications on file prior to visit.   Past Medical History:  Diagnosis Date   Anticoagulated on Coumadin    managed by cardiology   Anxiety    Chronic constipation    Chronic systolic CHF (congestive heart failure) ([place]) 2016   cardiologist--- dr end and followed by CHF clinic   CKD (chronic kidney disease), stage III ([place])    COPD (chronic obstructive pulmonary disease) ([place])    previous had seen pulmonology --- dr Melvyn Novas, note in epic 03-14-2016, dx COPD GOLD 0   Coronary artery disease    cardiologist--- dr Durward Mallard;  last cardiac cath 01-10-20202 mild nonobstructive disease proxLAD;  per pt, had LHC prior to AVR in [place] 2016   DOE (dyspnea on exertion)    GERD  (gastroesophageal reflux disease)    History of 2019 novel coronavirus disease (COVID-19) 12/19/2019   hospital admission w/ covid pneumonia , no intubatiion,  per pt symptoms resolved and back to baseline   History of cerebrovascular accident (CVA) with residual deficit    CVA in 2000 without residuals;  CVA post op AVR 01/ 2016 with residual right hand weakness   History of gastritis    History of pneumothorax    s/p right vats 11-19-2014 and  s/p left vats  12-13-2015   (both spontenaous due to bleb)   Hyperlipidemia    Hypertension    Hyperthyroidism    per pt followed by pcp,  dx approx 2014   Malignant neoplasm of upper-outer quadrant of left breast in female, estrogen receptor positive ([place]) 11/2016   oncologist-- dr Burr Medico--- dx 10/ 2018  ,  s/p left lumptectomy with node dissection;  completed radiation 04-15-2017, no chemo   Multiple thyroid nodules    in care everywhere pt had left thyroid nodule biospy 09-18-2014 benign   NICM (nonischemic cardiomyopathy) ([place]) 2016   2016  ef 30%;  last echo in epic ef 45%   OA (osteoarthritis)    Osteoporosis    Paroxysmal atrial fibrillation [facility])    cardiologist--- dr end   Personal history of radiation therapy    left breast cancer  03-08-2017  to 04-05-2017   Thrombocytopenia [facility])    followed by dr Burr Medico   Valvular stenosis, aortic 03/12/2014   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   Wears dentures    full upper   Allergies  Allergen Reactions   Lisinopril Cough   Tetanus Toxoid Adsorbed Swelling    Arm swelling   Other Cough    PEANUTS    Social History   Socioeconomic History   Marital status: Married    Spouse name: [name]   Number of children: 0   Years of education: 12   Highest education level: 12th grade  Occupational History   Occupation: Retired in 2004  Tobacco Use   Smoking status: Former    Packs/day: 0.25    Years: 10.00    Pack years: 2.50    Types: Cigarettes  Quit date:  2012    Years since quitting: 10.7   Smokeless tobacco: Never  Vaping Use   Vaping Use: Never used  Substance and Sexual Activity   Alcohol use: No   Drug use: Never   Sexual activity: Yes    Birth control/protection: Post-menopausal  Other Topics Concern   Not on file  Social History Narrative   Lives with husband.  Ambulated independently.   Right-handed.   No daily caffeine use.   Social Determinants of Health   Financial Resource Strain: Medium Risk   Difficulty of Paying Living Expenses: Somewhat hard  Food Insecurity: No Food Insecurity   Worried About Charity fundraiser in the Last Year: Never true   Ran Out of Food in the Last Year: Never true  Transportation Needs: No Transportation Needs   Lack of Transportation (Medical): No   Lack of Transportation (Non-Medical): No  Physical Activity: Sufficiently Active   Days of Exercise per Week: 7 days   Minutes of Exercise per Session: 30 min  Stress: No Stress Concern Present   Feeling of Stress : Only a little  Social Connections: Moderately Integrated   Frequency of Communication with Friends and Family: More than three times a week   Frequency of Social Gatherings with Friends and Family: More than three times a week   Attends Religious Services: More than 4 times per year   Active Member of Clubs or Organizations: No   Attends Archivist Meetings: Never   Marital Status: Married   Vitals:   12/08/20 0807  BP: 130/80  Pulse: 85  Resp: 16  SpO2: 98%   Body mass index is 38.69 kg/m.  Physical Exam Vitals and nursing note reviewed.  Constitutional:      General: She is not in acute distress.    Appearance: She is well-developed.  HENT:     Head: Normocephalic and atraumatic.     Mouth/Throat:     Mouth: Mucous membranes are moist.     Pharynx: Oropharynx is clear.  Eyes:     Conjunctiva/sclera: Conjunctivae normal.  Cardiovascular:     Rate and Rhythm: Normal rate and regular rhythm.      Heart sounds: Murmur (SEM I/VI RUSB) heard.     Comments: DP pulses present. Pulmonary:     Effort: Pulmonary effort is normal. No respiratory distress.     Breath sounds: Normal breath sounds.  Abdominal:     Palpations: Abdomen is soft.     Tenderness: There is no abdominal tenderness.  Skin:    General: Skin is warm.     Findings: No erythema or rash.  Neurological:     General: No focal deficit present.     Mental Status: She is alert and oriented to person, place, and time.     Comments: She is in a wheel chair.  Psychiatric:        Mood and Affect: Mood is anxious. Affect is labile.        Thought Content: Thought content does not include suicidal ideation. Thought content does not include suicidal plan.     Comments: Well groomed, good eye contact.   ASSESSMENT AND PLAN:  [name] was seen today for follow-up.  Diagnoses and all orders for this visit: Orders Placed This Encounter  Procedures   Flu Vaccine QUAD High Dose(Fluad)   TSH   Urinalysis, Routine w reflex microscopic   Ambulatory referral to Gastroenterology   POC HgB A1c   Lab Results  Component Value Date   HGBA1C 5.1 12/08/2020   Lab Results  Component Value Date   TSH 0.53 12/08/2020    Constipation, unspecified constipation type Adequate fiber intake. Because HFrEF she is on fluid restriction. Some of her medications can be aggravating problem. After discussion of some side effects,she agrees with trying Linzess 145 mcg daily prn. Instructed about warning signs.  -     linaclotide (LINZESS) 145 MCG CAPS capsule; Take 1 capsule (145 mcg total) by mouth daily before breakfast.  Insomnia, unspecified type Trazodone did not help, so stop it. Possible etiologies discussed. Today Duloxetine started to treat depression. Good sleep hygiene.  Hyperthyroidism Problem has been sell controlled. Continue Methimazole 15 mg daily. Further recommendations according to TSH result.  Generalized  osteoarthritis of multiple sites Following with pain management. Duloxetine 30 mg stared today.  -     DULoxetine (CYMBALTA) 30 MG capsule; Take 1 capsule (30 mg total) by mouth daily.  Urine frequency We discussed possible etiologies. Some of her medications can be aggravating problem. A1C in normal range. Further recommendations according to UA result.  Depression, major, single episode, moderate ([place]) We discussed treatment options. Because her hx of chronic pain (back and OA), she agrees with trying Duloxetine 30 mg daily. We discussed some side effects and risk of med interaction. Instructed about warning signs.  - DULoxetine (CYMBALTA) 30 MG capsule; Take 1 capsule (30 mg total) by mouth daily.  Dispense: 30 capsule; Refill: 1  Need for influenza vaccination -     Flu Vaccine QUAD High Dose(Fluad)  Colon cancer screening -     Ambulatory referral to Gastroenterology  I spent a total of 40 minutes in both face to face and non face to face activities for this visit on the date of this encounter. During this time history was obtained and documented, examination was performed, prior labs reviewed, and assessment/plan discussed.  Return in about 4 weeks (around 01/05/2021).   Danielle Gelinas G. [name], MD  [facility]. [place] office.

## 2020-12-08 NOTE — Patient Instructions (Addendum)
A few things to remember from today's visit:   Constipation, unspecified constipation type - Plan: linaclotide (LINZESS) 145 MCG CAPS capsule, TSH, Ambulatory referral to Gastroenterology  Colon cancer screening - Plan: Ambulatory referral to Gastroenterology  Insomnia, unspecified type  Hyperthyroidism  Generalized osteoarthritis of multiple sites - Plan: DULoxetine (CYMBALTA) 30 MG capsule  Urine frequency - Plan: POC HgB A1c, Urinalysis, Routine w reflex microscopic  If you need refills please call your pharmacy. Do not use My Chart to request refills or for acute issues that need immediate attention.  Today we started Duloxetine to help with mood and pain. This type of medications can increase suicidal risk. This is more prevalent among children,adolecents, and young adults with major depression or other psychiatric disorders. It can also make depression worse. Most common side effects are gastrointestinal, self limited after a few weeks: diarrhea, nausea, constipation  Or diarrhea among some.  In general it is well tolerated. We will follow closely.  Linzess 1 cap daily in the morning added today for constipation. It can cause diarrhea.  Adequate fiber intake. Some of your meds can aggravate constipation.  Please be sure medication list is accurate. If a new problem present, please set up appointment sooner than planned today.   Constipation, Adult Constipation is when a person has fewer than three bowel movements in a week, has difficulty having a bowel movement, or has stools (feces) that are dry, hard, or larger than normal. Constipation may be caused by an underlying condition. It may become worse with age if a person takes certain medicines and does not take in enough fluids. Follow these instructions at home: Eating and drinking  Eat foods that have a lot of fiber, such as beans, whole grains, and fresh fruits and vegetables. Limit foods that are low in fiber and  high in fat and processed sugars, such as fried or sweet foods. These include french fries, hamburgers, cookies, candies, and soda. Drink enough fluid to keep your urine pale yellow. General instructions Exercise regularly or as told by your health care provider. Try to do 150 minutes of moderate exercise each week. Use the bathroom when you have the urge to go. Do not hold it in. Take over-the-counter and prescription medicines only as told by your health care provider. This includes any fiber supplements. During bowel movements: Practice deep breathing while relaxing the lower abdomen. Practice pelvic floor relaxation. Watch your condition for any changes. Let your health care provider know about them. Keep all follow-up visits as told by your health care provider. This is important. Contact a health care provider if: You have pain that gets worse. You have a fever. You do not have a bowel movement after 4 days. You vomit. You are not hungry or you lose weight. You are bleeding from the opening between the buttocks (anus). You have thin, pencil-like stools. Get help right away if: You have a fever and your symptoms suddenly get worse. You leak stool or have blood in your stool. Your abdomen is bloated. You have severe pain in your abdomen. You feel dizzy or you faint. Summary Constipation is when a person has fewer than three bowel movements in a week, has difficulty having a bowel movement, or has stools (feces) that are dry, hard, or larger than normal. Eat foods that have a lot of fiber, such as beans, whole grains, and fresh fruits and vegetables. Drink enough fluid to keep your urine pale yellow. Take over-the-counter and prescription medicines only as  told by your health care provider. This includes any fiber supplements. This information is not intended to replace advice given to you by your health care provider. Make sure you discuss any questions you have with your health  care provider. Document Revised: 01/03/2019 Document Reviewed: 01/03/2019 Elsevier Patient Education  [place].

## 2020-12-09 ENCOUNTER — Telehealth: Payer: Self-pay

## 2020-12-09 NOTE — Telephone Encounter (Signed)
Patient called stating she didn't receive Rx for DULoxetine (CYMBALTA) 30 MG capsule I informed patient Rx was sent to the pharmacy

## 2020-12-10 ENCOUNTER — Ambulatory Visit: Payer: Medicare Other

## 2020-12-10 ENCOUNTER — Other Ambulatory Visit: Payer: Self-pay

## 2020-12-16 ENCOUNTER — Other Ambulatory Visit: Payer: Self-pay

## 2020-12-16 ENCOUNTER — Ambulatory Visit (HOSPITAL_BASED_OUTPATIENT_CLINIC_OR_DEPARTMENT_OTHER): Payer: Medicare Other | Admitting: Physical Therapy

## 2020-12-16 DIAGNOSIS — M6281 Muscle weakness (generalized): Secondary | ICD-10-CM

## 2020-12-16 DIAGNOSIS — M25562 Pain in left knee: Secondary | ICD-10-CM | POA: Diagnosis not present

## 2020-12-16 DIAGNOSIS — G8929 Other chronic pain: Secondary | ICD-10-CM

## 2020-12-16 DIAGNOSIS — M25561 Pain in right knee: Secondary | ICD-10-CM | POA: Diagnosis not present

## 2020-12-16 DIAGNOSIS — R293 Abnormal posture: Secondary | ICD-10-CM

## 2020-12-16 DIAGNOSIS — R262 Difficulty in walking, not elsewhere classified: Secondary | ICD-10-CM

## 2020-12-16 DIAGNOSIS — M545 Low back pain, unspecified: Secondary | ICD-10-CM | POA: Diagnosis not present

## 2020-12-16 NOTE — Therapy (Signed)
OUTPATIENT PHYSICAL THERAPY TREATMENT NOTE   Patient Name: Danielle [name] MRN: [id number] DOB:September 24, 1945, 75 y.o., female Today's Date: 12/16/2020  PCP: [name], [name], MD REFERRING PROVIDER: [name], [name], MD   PT End of Session - 12/16/20 1940     Visit Number 2    Number of Visits 12    Date for PT Re-Evaluation 01/19/21    Authorization Type [place] Time Period 6th visit FOTO - 10th visit FOTO/reval    PT Start Time 0845    PT Stop Time 0927    PT Time Calculation (min) 42 min    Activity Tolerance Patient limited by fatigue    Behavior During Therapy [facility] for tasks assessed/performed             Past Medical History:  Diagnosis Date   Anticoagulated on Coumadin    managed by cardiology   Anxiety    Chronic constipation    Chronic systolic CHF (congestive heart failure) ([place]) 2016   cardiologist--- dr end and followed by CHF clinic   CKD (chronic kidney disease), stage III ([place])    COPD (chronic obstructive pulmonary disease) ([place])    previous had seen pulmonology --- dr Melvyn Novas, note in epic 03-14-2016, dx COPD GOLD 0   Coronary artery disease    cardiologist--- dr Durward Mallard;  last cardiac cath 01-10-20202 mild nonobstructive disease proxLAD;  per pt, had LHC prior to AVR in [place] 2016   DOE (dyspnea on exertion)    GERD (gastroesophageal reflux disease)    History of 2019 novel coronavirus disease (COVID-19) 12/19/2019   hospital admission w/ covid pneumonia , no intubatiion,  per pt symptoms resolved and back to baseline   History of cerebrovascular accident (CVA) with residual deficit    CVA in 2000 without residuals;  CVA post op AVR 01/ 2016 with residual right hand weakness   History of gastritis    History of pneumothorax    s/p right vats 11-19-2014 and  s/p left vats  12-13-2015   (both spontenaous due to bleb)   Hyperlipidemia    Hypertension    Hyperthyroidism    per pt followed by pcp,  dx approx 2014   Malignant  neoplasm of upper-outer quadrant of left breast in female, estrogen receptor positive ([place]) 11/2016   oncologist-- dr Burr Medico--- dx 10/ 2018  ,  s/p left lumptectomy with node dissection;  completed radiation 04-15-2017, no chemo   Multiple thyroid nodules    in care everywhere pt had left thyroid nodule biospy 09-18-2014 benign   NICM (nonischemic cardiomyopathy) ([place]) 2016   2016  ef 30%;  last echo in epic ef 45%   OA (osteoarthritis)    Osteoporosis    Paroxysmal atrial fibrillation [facility])    cardiologist--- dr end   Personal history of radiation therapy    left breast cancer  03-08-2017  to 04-05-2017   Thrombocytopenia [facility])    followed by dr Burr Medico   Valvular stenosis, aortic 03/12/2014   Status post bioprosthetic AVR  in [place]  for severe AS   Wears dentures    full upper   Past Surgical History:  Procedure Laterality Date   AORTIC VALVE REPLACEMENT  03/12/2014   [facility] [place];  St Jude 69mm, medial Trifecta Bioprosthesis   BREAST LUMPECTOMY WITH RADIOACTIVE SEED AND SENTINEL LYMPH NODE BIOPSY Left 01/14/2017   Procedure: LEFT BREAST LUMPECTOMY WITH RADIOACTIVE SEED AND SENTINEL LYMPH NODE BIOPSY;  Surgeon: Rolm Bookbinder,  MD;  Location: [place];  Service: General;  Laterality: Left;   CARDIAC CATHETERIZATION  02/12/2014   [facility] in [place] D & C N/A 11/17/2020   Procedure: DILATATION AND CURETTAGE /HYSTEROSCOPY WITH MYOSURE;  Surgeon: Nunzio Cobbs, MD;  Location[facility];  Service: Gynecology;  Laterality: N/A;   INTRAMEDULLARY (IM) NAIL INTERTROCHANTERIC Left 12/10/2015   Procedure: INTRAMEDULLARY (IM) NAIL INTERTROCHANTRIC;  Surgeon: Leandrew Koyanagi, MD;  Location: [place];  Service: Orthopedics;  Laterality: Left;   OPERATIVE ULTRASOUND N/A 11/17/2020   Procedure: OPERATIVE ULTRASOUND;  Surgeon: Nunzio Cobbs, MD;  Location: [facility];  Service: Gynecology;  Laterality: N/A;    PLEURADESIS Left 12/03/2015   Procedure: PLEURADESIS;  Surgeon: Melrose Nakayama, MD;  Location: [place];  Service: Thoracic;  Laterality: Left;   RESECTION OF APICAL BLEB Left 12/03/2015   Procedure: BLEBECTOMY;  Surgeon: Melrose Nakayama, MD;  Location: [place];  Service: Thoracic;  Laterality: Left;   RIGHT/LEFT HEART CATH AND CORONARY ANGIOGRAPHY N/A 03/10/2020   Procedure: RIGHT/LEFT HEART CATH AND CORONARY ANGIOGRAPHY;  Surgeon: [name], MD;  Location: [place] [facility];  Service: Cardiovascular;  Laterality: N/A;   VIDEO ASSISTED THORACOSCOPY Left 12/03/2015   Procedure: VIDEO ASSISTED THORACOSCOPY;  Surgeon: Melrose Nakayama, MD;  Location: [place];  Service: Thoracic;  Laterality: Left;   VIDEO ASSISTED THORACOSCOPY (VATS) W/TALC PLEUADESIS Right 11/19/2014   in [place]   Patient Active Problem List   Diagnosis Date Noted   Depression, major, single episode, moderate ([place]) 12/08/2020   Gait abnormality 07/31/2020   Paresthesia 07/31/2020   Low back pain with bilateral sciatica 07/31/2020   Pneumonia due to COVID-19 virus    Bacteremia due to Gram-positive bacteria 08/29/2018   Abscess    Cellulitis of left breast 08/28/2018   Cellulitis of left abdominal wall 08/28/2018   Encounter for routine gynecological examination 04/28/2018   Stage 3 chronic kidney disease ([place]) 09/27/2017   Recurrent genital herpes 04/13/2017   Chronic pain disorder 01/25/2017   Malignant neoplasm of upper-outer quadrant of left breast in female, estrogen receptor positive ([place]) 12/16/2016   Chronic knee pain 12/07/2016   Chronic bilateral low back pain without sciatica 12/07/2016   Peripheral musculoskeletal gait disorder 12/07/2016   Encounter for therapeutic drug monitoring 06/16/2016   Generalized osteoarthritis of multiple sites 05/04/2016   Chronic anticoagulation 05/04/2016   Stroke ([place]) 04/14/2016   Aortic valve disease 04/10/2016   S/P AVR 04/10/2016   NICM  (nonischemic cardiomyopathy) ([place]) 04/10/2016   Upper airway cough syndrome 03/14/2016   Morbid obesity ([place]) 03/14/2016   COPD (chronic obstructive pulmonary disease) ([place]) 03/11/2016   Thrombocytopenia ([place]) 12/12/2015   Anemia 12/12/2015   Vitamin D deficiency 12/12/2015   Acute urinary retention 12/11/2015   Osteoporosis 12/11/2015   Hip fracture ([place]) 12/10/2015   Aortic atherosclerosis ([place]) 12/10/2015   Lung blebs ([place]) 12/03/2015   Pelvic fracture ([place]) 08/19/2015   Fall at home 08/19/2015   Essential hypertension 08/19/2015   Mixed hyperlipidemia 08/19/2015   Asthma 08/19/2015   Paroxysmal atrial fibrillation ([place]) 08/19/2015   Hyperthyroidism 08/19/2015   Chronic HFrEF (heart failure with reduced ejection fraction) (H[facility])    Coronary artery disease    Aortic stenosis    PCP: [name], [name], MD   REFERRING PROVIDER: Dr Jone Baseman   REFERRING DIAG:  [id number] (ICD-10-CM) - Spondylosis without myelopathy or radiculopathy, lumbar region  M25.561,M25.562,G89.29 (  ICD-10-CM) - Chronic pain of both knees      THERAPY DIAG:  Low Back Pain Bilateral Knee Pain L>R    ONSET DATE: Several years    SUBJECTIVE:    SUBJECTIVE STATEMENT: Patient reports her back is not hurting too bad, but she is having significant pain in her knees.    PERTINENT HISTORY: OA of the knees and back, anxiety, CHF, COPD, chronic constipation   PAIN:  Are you having pain? No VAS scale: 5/10 Pain location: Knee Pain orientation: Left/ Right knee  PAIN TYPE: aching Pain description: intermittent ( with movement and walking)  Aggravating factors: Standing, walking  Relieving factors: Tramadol, Voltaren    Today's treatment  10/18  Pt seen for aquatic therapy today.  Treatment took place in water 3.25-4 ft in depth at the [employer] pool. Temp of water was 91.  Pt entered/exited the pool via stairs (step through pattern) independently with bilat rail.  Patient entered the  water with min a for balance. Next visit we will have her enter with the cahir lift.  She used the water walker. With PT control the patient walked across the pool 3x.   Standing at the wall 2x  supported march x10; standing hip abduction x10; standing hip extension x10 bilateral   Attempted to have patient sit on the step. Patient had a hard time finding her balance. Her feet floated up. SHe could maintain balance only with assist. LAQ with assist for balance x20; march with assist for balance x20    Pt requires buoyancy for support and to offload joints with strengthening exercises. Viscosity of the water is needed for resistance of strengthening; water current perturbations provides challenge to standing balance unsupported, requiring increased core activation.   ASSESSMENT:   CLINICAL IMPRESSION: Patient was limited today. She has a fear of water that causes her to be tense and likely causing her increased pain in her lower back. She had pain in her bak standing. Sitting she was unabe to maintain her balance well enough on the seated to perform many exercises. She is very motivated. We will continue to progress as tolerated.   Abnormal gait, decreased activity tolerance, decreased balance, decreased endurance, decreased knowledge of use of DME, decreased mobility, difficulty walking, decreased ROM, decreased strength, increased fascial restrictions, increased muscle spasms, improper body mechanics, obesity, and pain   cleaning, community activity, meal prep, laundry, shopping, and yard work   Age, Past/current experiences, and 3+ comorbidities: anxiety, COPD, OA in multi joints  REHAB POTENTIAL: Good   CLINICAL DECISION MAKING: Evolving/moderate complexity decreasing ability to walk    EVALUATION COMPLEXITY: Moderate     GOALS: Goals reviewed with patient? No   SHORT TERM GOALS:   STG Name Target Date Goal status  1 Patient will ambulate 28' without significant increase in lower  back pain and dyspnea  Baseline:  01/05/2021 INITIAL  2 Patient will decrease 5x sit to stand test to 20 sec  Baseline:  01/05/2021 INITIAL  3 Patient will increase bilateral hip flexion to 4/5  Baseline: 01/05/2021 INITIAL    LONG TERM GOALS:    LTG Name Target Date Goal status  1 Patient will be independent with complete gym and pool program and figure out access to a pool for long term program  Baseline: 02/02/2021 INITIAL  2 Patient will ambulate 100' without significant lower back pain in order to imrove ability to walk in the community.  Baseline: 02/02/2021 INITIAL  3 Patient will stand for  20 min without increased low back and knee pain in order to do household chores  Baseline: 02/02/2021 INITIAL  PLAN: PT FREQUENCY: 2x/week   PT DURATION: 8 weeks   PLANNED INTERVENTIONS: Therapeutic exercises, Therapeutic activity, Neuro Muscular re-education, Balance training, Gait training, Patient/Family education, Joint mobilization, DME instructions, Aquatic Therapy, Dry Needling, Electrical stimulation, Cryotherapy, Moist heat, Taping, Ultrasound, and Manual therapy   PLAN FOR NEXT SESSION: begin basic hip and core exercises in the water. Work on walking endurance in the water. Work on ambulation with proper posture in the water.         Carney Living PT DPT  12/16/2020, 8:01 PM

## 2020-12-17 ENCOUNTER — Other Ambulatory Visit: Payer: Self-pay | Admitting: Physical Medicine & Rehabilitation

## 2020-12-17 ENCOUNTER — Encounter (HOSPITAL_COMMUNITY): Payer: Self-pay | Admitting: Cardiology

## 2020-12-17 ENCOUNTER — Ambulatory Visit (HOSPITAL_COMMUNITY)
Admission: RE | Admit: 2020-12-17 | Discharge: 2020-12-17 | Disposition: A | Discharge status: Home / Self Care | Payer: Medicare Other | Source: Ambulatory Visit | Attending: Cardiology | Admitting: Cardiology

## 2020-12-17 ENCOUNTER — Other Ambulatory Visit (HOSPITAL_COMMUNITY): Payer: Self-pay

## 2020-12-17 VITALS — BP 110/70 | HR 75 | Wt 262.4 lb

## 2020-12-17 DIAGNOSIS — Z8249 Family history of ischemic heart disease and other diseases of the circulatory system: Secondary | ICD-10-CM | POA: Insufficient documentation

## 2020-12-17 DIAGNOSIS — E0590 Thyrotoxicosis, unspecified without thyrotoxic crisis or storm: Secondary | ICD-10-CM | POA: Diagnosis not present

## 2020-12-17 DIAGNOSIS — I428 Other cardiomyopathies: Secondary | ICD-10-CM | POA: Diagnosis not present

## 2020-12-17 DIAGNOSIS — I110 Hypertensive heart disease with heart failure: Secondary | ICD-10-CM | POA: Diagnosis not present

## 2020-12-17 DIAGNOSIS — I252 Old myocardial infarction: Secondary | ICD-10-CM | POA: Diagnosis not present

## 2020-12-17 DIAGNOSIS — Z7951 Long term (current) use of inhaled steroids: Secondary | ICD-10-CM | POA: Diagnosis not present

## 2020-12-17 DIAGNOSIS — Z87891 Personal history of nicotine dependence: Secondary | ICD-10-CM | POA: Insufficient documentation

## 2020-12-17 DIAGNOSIS — E785 Hyperlipidemia, unspecified: Secondary | ICD-10-CM | POA: Insufficient documentation

## 2020-12-17 DIAGNOSIS — I480 Paroxysmal atrial fibrillation: Secondary | ICD-10-CM | POA: Insufficient documentation

## 2020-12-17 DIAGNOSIS — Z952 Presence of prosthetic heart valve: Secondary | ICD-10-CM | POA: Diagnosis not present

## 2020-12-17 DIAGNOSIS — Z7901 Long term (current) use of anticoagulants: Secondary | ICD-10-CM | POA: Insufficient documentation

## 2020-12-17 DIAGNOSIS — Z7984 Long term (current) use of oral hypoglycemic drugs: Secondary | ICD-10-CM | POA: Insufficient documentation

## 2020-12-17 DIAGNOSIS — Z7983 Long term (current) use of bisphosphonates: Secondary | ICD-10-CM | POA: Diagnosis not present

## 2020-12-17 DIAGNOSIS — I2510 Atherosclerotic heart disease of native coronary artery without angina pectoris: Secondary | ICD-10-CM | POA: Insufficient documentation

## 2020-12-17 DIAGNOSIS — I5022 Chronic systolic (congestive) heart failure: Secondary | ICD-10-CM | POA: Diagnosis not present

## 2020-12-17 DIAGNOSIS — Z8673 Personal history of transient ischemic attack (TIA), and cerebral infarction without residual deficits: Secondary | ICD-10-CM | POA: Insufficient documentation

## 2020-12-17 DIAGNOSIS — Z953 Presence of xenogenic heart valve: Secondary | ICD-10-CM | POA: Diagnosis not present

## 2020-12-17 DIAGNOSIS — G894 Chronic pain syndrome: Secondary | ICD-10-CM

## 2020-12-17 DIAGNOSIS — I351 Nonrheumatic aortic (valve) insufficiency: Secondary | ICD-10-CM | POA: Diagnosis not present

## 2020-12-17 DIAGNOSIS — Z79899 Other long term (current) drug therapy: Secondary | ICD-10-CM | POA: Diagnosis not present

## 2020-12-17 DIAGNOSIS — M159 Polyosteoarthritis, unspecified: Secondary | ICD-10-CM

## 2020-12-17 LAB — BASIC METABOLIC PANEL
Anion gap: 7 (ref 5–15)
BUN: 12 mg/dL (ref 8–23)
CO2: 22 mmol/L (ref 22–32)
Calcium: 9.3 mg/dL (ref 8.9–10.3)
Chloride: 109 mmol/L (ref 98–111)
Creatinine, Ser: 1.53 mg/dL — ABNORMAL HIGH (ref 0.44–1.00)
GFR, Estimated: 35 mL/min — ABNORMAL LOW (ref >60)
Glucose, Bld: 96 mg/dL (ref 70–99)
Potassium: 3.4 mmol/L — ABNORMAL LOW (ref 3.5–5.1)
Sodium: 138 mmol/L (ref 135–145)

## 2020-12-17 LAB — TSH: TSH: 1.146 u[IU]/mL (ref 0.350–4.500)

## 2020-12-17 LAB — PROTIME-INR
INR: 1.2 (ref 0.8–1.2)
Prothrombin Time: 15.5 seconds — ABNORMAL HIGH (ref 11.4–15.2)

## 2020-12-17 LAB — BRAIN NATRIURETIC PEPTIDE: B Natriuretic Peptide: 306.6 pg/mL — ABNORMAL HIGH (ref 0.0–100.0)

## 2020-12-17 MED ORDER — SPIRONOLACTONE 25 MG PO TABS: 25.0000 mg | ORAL_TABLET | Freq: Every day | ORAL | 3 refills | Status: DC

## 2020-12-17 MED ORDER — TORSEMIDE 20 MG PO TABS: ORAL_TABLET | ORAL | 3 refills | Status: DC

## 2020-12-17 MED ORDER — APIXABAN 5 MG PO TABS: 5.0000 mg | ORAL_TABLET | Freq: Two times a day (BID) | ORAL | 5 refills | Status: DC

## 2020-12-17 NOTE — Progress Notes (Signed)
Medication Samples have been provided to the patient.  Drug name: Eliquis       Strength: 5 mg        Qty: 2  LOT: [id number]  Exp.Date: 10/2022  Dosing instructions: Take 1 tablet Twice daily   The patient has been instructed regarding the correct time, dose, and frequency of taking this medication, including desired effects and most common side effects.   Danielle [name] 11:32 AM 12/17/2020

## 2020-12-17 NOTE — Progress Notes (Signed)
ReDS Vest / Clip - 12/17/20 1000       ReDS Vest / Clip   Station Marker C    Ruler Value 32    ReDS Value Range Moderate volume overload    ReDS Actual Value 38    Anatomical Comments sitting

## 2020-12-17 NOTE — Patient Instructions (Addendum)
INCREASE Spironolactone to 25mg  (1 tab) daily   INCREASE Torsemide to 40mg  (2 tabs) daily alternating with 20mg  (1 tab) every other day  The Pharmacist will guide you on transitioning from Warfarin to Eliquis.  STOP COUMADIN.  START Eliquis 5mg  (1 tab) twice a day. DO NOT START UNTIL YOU ARE CALLED TO DO SO.  We are waiting for your lab work to return   Labs today and repeat in 10 days We will only contact you if something comes back abnormal or we need to make some changes. Otherwise no news is good news!  Your physician has requested that you have an echocardiogram. Echocardiography is a painless test that uses sound waves to create images of your heart. It provides your doctor with information about the size and shape of your heart and how well your heart's chambers and valves are working. This procedure takes approximately one hour. There are no restrictions for this procedure.  Your physician recommends that you schedule a follow-up appointment in: 2 months with an echo  Please call office at [phone] option 2 if you have any questions or concerns.   At the [facility], you and your health needs are our priority. As part of our continuing mission to provide you with exceptional heart care, we have created designated Provider Care Teams. These Care Teams include your primary Cardiologist (physician) and Advanced Practice Providers (APPs- Physician Assistants and Nurse Practitioners) who all work together to provide you with the care you need, when you need it.   You may see any of the following providers on your designated Care Team at your next follow up: Dr Glori Bickers Dr [name] Dr Patrice Paradise, NP Lyda Jester, [place] Ginnie Smart Audry Riles, PharmD   Please be sure to bring in all your medications bottles to every appointment.

## 2020-12-17 NOTE — Progress Notes (Signed)
PCP: [name], [name], MD Cardiology: Dr. Saunders Revel HF cardiology: Dr. Aundra Dubin  75 y.o. with history of aortic insufficiency s/p bioprosthetic aortic valve and CHF was referred by Dr End for evaluation of CHF.  Patient had bioprosthetic aortic valve replacement for aortic insufficiency in 1/16 at [facility] in [place], [place].  Since that time, she has had mild to moderately reduced LV systolic function, as low as 30-35% and as high at 45-50%.  Most recent echo in 9/21 showed EF 45-50%.  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  She has had paroxysmal atrial fibrillation and 2 prior CVAs.  She is on warfarin.  She has had breast cancer treated with lumpectomy and radiation, no chemotherapy.  She has history of hyperthyroidism and is on methimazole.  She has controlled HTN.    Cardiac MRI (10/22) with LV EF 41%, normal RV, subendocardial LGE in the mid-apical inferior wall and the mid-apical inferolateral wall.   She returns for followup of CHF.  She had recent D&C for post-menopausal bleeding, this was benign.  She is now doing PT in the pool (walking in pool).  She is still short of breath with most walking.  Chronic 3 pillow orthopnea.  No PND.  Weight up 2 lbs.  No chest pain.   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 Left breast cancer: Treated with lumpectomy and radiation, no chemotherapy.  2. Hyperthyroidism 3. Aortic insufficiency: Severe, found when she lived in [place].  She had bioprosthetic aortic valve replacement at [facility] in 1/16.  4. Atrial fibrillation: Paroxysmal. 5. CVA x 2 6. HTN 7. Hyperlipidemia 8. Chronic systolic CHF: Nonischemic cardiomyopathy.  - Echo (3/16): EF 45-50% - Echo (1/17): EF 30-35% - Echo (2/18): EF 35-40% - Echo (11/20): EF 35-40% - Echo (9/21): EF 45-50%, global hypokinesis, mild LVH,  bioprosthetic aortic valve mean gradient 10 mmHg, normal RV - RHC/LHC (1/22): mean RA 9, PA 35/20, mean PCWP 21, CI 3.2.  Nonobstructive CAD.  - cMRI (10/22): LV EF 41%, normal RV, subendocardial LGE in the mid-apical inferior wall and the mid-apical inferolateral wall. 9. H/o spontaneous PTX  Social History   Socioeconomic History   Marital status: Married    Spouse name: Sherwood   Number of children: 0   Years of education: 12   Highest education level: 12th grade  Occupational History   Occupation: Retired in 2004  Tobacco Use   Smoking status: Former    Packs/day: 0.25    Years: 10.00    Pack years: 2.50    Types: Cigarettes    Quit date: 2012    Years since quitting: 10.8   Smokeless tobacco: Never  Vaping Use   Vaping Use: Never used  Substance and Sexual Activity   Alcohol use: No   Drug use: Never   Sexual activity: Yes    Birth control/protection: Post-menopausal  Other Topics Concern   Not on file  Social History Narrative   Lives with husband.  Ambulated independently.   Right-handed.   No daily caffeine use.   Social Determinants of Health   Financial Resource Strain: Medium Risk   Difficulty of Paying Living Expenses: Somewhat hard  Food Insecurity: No Food Insecurity   Worried About Running Out of Food in the Last Year: Never true   Ran Out of Food in the Last Year: Never true  Transportation Needs: No Data processing manager (Medical): No   Lack of Transportation (Non-Medical): No  Physical Activity: Sufficiently Active   Days of Exercise per Week: 7 days   Minutes of Exercise per Session: 30 min  Stress: No Stress Concern Present   Feeling of Stress : Only a little  Social Connections: Moderately Integrated   Frequency of Communication with Friends and Family: More than three times a week   Frequency of Social Gatherings with Friends and Family: More than three times a week   Attends Religious Services: More than 4 times  per year   Active Member of [employer] or Organizations: No   Attends Music therapist: Never   Marital Status: Married  Human resources officer Violence: Not At Risk   Fear of Current or Ex-Partner: No   Emotionally Abused: No   Physically Abused: No   Sexually Abused: No   Family History  Problem Relation Age of Onset   Diabetes Mother    Heart attack Mother 53   Diabetes Father    Lung cancer Father    Diabetes Sister    Thyroid disease Sister    Diabetes Sister    HIV Brother    ROS: All systems reviewed and negative except as per HPI.   Current Outpatient Medications  Medication Sig Dispense Refill   albuterol (PROVENTIL HFA;VENTOLIN HFA) 108 (90 Base) MCG/ACT inhaler Inhale 2 puffs into the lungs every 6 (six) hours as needed for wheezing or shortness of breath.     alendronate (FOSAMAX) 70 MG tablet Take 1 tablet (70 mg total) by mouth once a week. Take with a full glass of water on an empty stomach. 13 tablet 3   apixaban (ELIQUIS) 5 MG TABS tablet Take 1 tablet (5 mg total) by mouth 2 (two) times daily. 60 tablet 5   ascorbic acid (VITAMIN C) 500 MG tablet Take 500 mg by mouth daily.     budesonide-formoterol (SYMBICORT) 160-4.5 MCG/ACT inhaler Inhale 2 puffs into the lungs daily as needed. 3 each 3   carvedilol (COREG) 12.5 MG tablet Take 1 tablet (12.5 mg total) by mouth 2 (two) times daily with a meal. 180 tablet 0   Cholecalciferol 25 MCG (1000 UT) tablet Take 1,000 Units by mouth daily.     dapagliflozin propanediol (FARXIGA) 10 MG TABS tablet Take 1 tablet (10 mg total) by mouth daily before breakfast. 30 tablet 3   DULoxetine (CYMBALTA) 30 MG capsule Take 1 capsule (30 mg total) by mouth daily. 30 capsule 1   fluticasone (FLONASE) 50 MCG/ACT nasal spray Place 1 spray into both nostrils daily. 16 g 3   linaclotide (LINZESS) 145 MCG CAPS capsule Take 1 capsule (145 mcg total) by mouth daily before breakfast. 30 capsule 1   methimazole (TAPAZOLE) 5 MG tablet Take 3  tablets (15 mg total) by mouth daily. 270 tablet 2   pantoprazole (PROTONIX) 40 MG tablet Take 1 tablet (40 mg total) by mouth daily. 30 tablet 11   rosuvastatin (CRESTOR) 40 MG tablet TAKE 1 TABLET BY MOUTH  DAILY 90 tablet 3   sacubitril-valsartan (ENTRESTO) 49-51 MG Take 1 tablet by mouth 2 (two) times daily.     senna-docusate (SENOKOT-S) 8.6-50 MG tablet Take 2 tablets by mouth at bedtime.     traMADol (ULTRAM) 50 MG tablet TAKE 1 TABLET BY MOUTH THREE TIMES DAILY 90 tablet 5   trolamine salicylate (ASPERCREME) 10 % cream Apply 1 application topically as needed for muscle  pain.     valACYclovir (VALTREX) 500 MG tablet Take on tablet by mouth twice a day for 3 days as needed for an outbreak 30 tablet 0   spironolactone (ALDACTONE) 25 MG tablet Take 1 tablet (25 mg total) by mouth daily. 90 tablet 3   torsemide (DEMADEX) 20 MG tablet Take 40mg  daily alternating with 20mg  every other day 90 tablet 3   No current facility-administered medications for this encounter.   BP 110/70   Pulse 75   Wt 119 kg (262 lb 6.4 oz)   SpO2 97%   BMI 41.10 kg/m  General: NAD Neck: JVP 8-9 cm, no thyromegaly or thyroid nodule.  Lungs: Clear to auscultation bilaterally with normal respiratory effort. CV: Nondisplaced PMI.  Heart regular S1/S2, no S3/S4, no murmur.  1+ ankle edema.  No carotid bruit.  Normal pedal pulses.  Abdomen: Soft, nontender, no hepatosplenomegaly, no distention.  Skin: Intact without lesions or rashes.  Neurologic: Alert and oriented x 3.  Psych: Normal affect. Extremities: No clubbing or cyanosis.  HEENT: Normal.   Assessment/Plan: 1. Chronic HF with mid range EF: Nonischemic cardiomyopathy.  Echo in 9/21 showed EF 45-50%, global hypokinesis, mild LVH, bioprosthetic aortic valve mean gradient 10 mmHg, normal RV.  Cause of cardiomyopathy is uncertain, mild CAD on 1/22 cath.  It is possible that this is chronic effect of long-standing AI that was not improved with valve replacement.   Also consider long-standing HTN.  Cardiac MRI in 10/22 showed LV EF 41%, normal RV, subendocardial LGE in the mid-apical inferior wall and the mid-apical inferolateral wall. The LGE pattern does not look like cardiac amyloidosis.  The subendocardial LGE looks like it came from prior MI but minimal CAD on cath => ?prior embolism down a coronary related to atrial fibrillation.  Still NYHA class III symptoms.  She seems significantly limited though some of this may be from deconditioning. On exam, she is volume overloaded with REDS clip 38%.   - Increase torsemide to 40 qam/20 qpm. BMET/BNP today and again in 10 days.   - Continue Farxiga 10 mg daily.  - Continue Coreg 12.5 mg bid.  - Continue Entresto 49/51 bid.  - Increase spironolactone to 25 mg daily.  - Would benefit from exercise regimen, ?cardiac rehab (but not sure this will be covered without lower EF).   2. Aortic valve disorders: Per report, had severe AI.  Now s/p bioprosthetic aortic valve replacement.  The valve was stable on 9/21 echo and appeared to function well on 10/22 cardiac MRI.  - Will need antibiotic prophylaxis with dental work.  3. HTN: BP controlled on current regimen.  4. Atrial fibrillation: Paroxysmal.  She is in NSR today, rare palpitations.   - She wants to switch to DOAC.  Current data suggests DOAC is reasonable in setting of bioprosthetic valve, will check INR today and stop warfarin.  Start apixaban.   Followup in 6 wks.   [name] 12/17/2020

## 2020-12-18 ENCOUNTER — Other Ambulatory Visit: Payer: Self-pay | Admitting: Internal Medicine

## 2020-12-18 ENCOUNTER — Ambulatory Visit: Payer: Self-pay

## 2020-12-19 ENCOUNTER — Ambulatory Visit (HOSPITAL_BASED_OUTPATIENT_CLINIC_OR_DEPARTMENT_OTHER): Payer: Medicare Other | Admitting: Physical Therapy

## 2020-12-19 ENCOUNTER — Other Ambulatory Visit: Payer: Self-pay

## 2020-12-19 DIAGNOSIS — R262 Difficulty in walking, not elsewhere classified: Secondary | ICD-10-CM | POA: Diagnosis not present

## 2020-12-19 DIAGNOSIS — M6281 Muscle weakness (generalized): Secondary | ICD-10-CM | POA: Diagnosis not present

## 2020-12-19 DIAGNOSIS — M545 Low back pain, unspecified: Secondary | ICD-10-CM

## 2020-12-19 DIAGNOSIS — G8929 Other chronic pain: Secondary | ICD-10-CM

## 2020-12-19 DIAGNOSIS — M25562 Pain in left knee: Secondary | ICD-10-CM | POA: Diagnosis not present

## 2020-12-19 DIAGNOSIS — R293 Abnormal posture: Secondary | ICD-10-CM | POA: Diagnosis not present

## 2020-12-19 DIAGNOSIS — M25561 Pain in right knee: Secondary | ICD-10-CM | POA: Diagnosis not present

## 2020-12-19 NOTE — Therapy (Addendum)
OUTPATIENT PHYSICAL THERAPY TREATMENT NOTE/Discharge    Patient Name: Danielle [name] MRN: [id number] DOB:06-Mar-1945, 75 y.o., female Today's Date: 12/19/2020  PCP: [name], [name], MD REFERRING PROVIDER: [name], [name], MD   PT End of Session - 12/19/20 1353     Visit Number 3    Number of Visits 12    Date for PT Re-Evaluation 01/19/21    Authorization Type [place] Time Period 6th visit FOTO - 10th visit FOTO/reval    PT Start Time 1100    PT Stop Time 1143    PT Time Calculation (min) 43 min    Activity Tolerance Patient limited by fatigue    Behavior During Therapy [facility] for tasks assessed/performed             Past Medical History:  Diagnosis Date   Anticoagulated on Coumadin    managed by cardiology   Anxiety    Chronic constipation    Chronic systolic CHF (congestive heart failure) ([place]) 2016   cardiologist--- dr end and followed by CHF clinic   CKD (chronic kidney disease), stage III ([place])    COPD (chronic obstructive pulmonary disease) ([place])    previous had seen pulmonology --- dr Melvyn Novas, note in epic 03-14-2016, dx COPD GOLD 0   Coronary artery disease    cardiologist--- dr Durward Mallard;  last cardiac cath 01-10-20202 mild nonobstructive disease proxLAD;  per pt, had LHC prior to AVR in [place] 2016   DOE (dyspnea on exertion)    GERD (gastroesophageal reflux disease)    History of 2019 novel coronavirus disease (COVID-19) 12/19/2019   hospital admission w/ covid pneumonia , no intubatiion,  per pt symptoms resolved and back to baseline   History of cerebrovascular accident (CVA) with residual deficit    CVA in 2000 without residuals;  CVA post op AVR 01/ 2016 with residual right hand weakness   History of gastritis    History of pneumothorax    s/p right vats 11-19-2014 and  s/p left vats  12-13-2015   (both spontenaous due to bleb)   Hyperlipidemia    Hypertension    Hyperthyroidism    per pt followed by pcp,  dx approx 2014    Malignant neoplasm of upper-outer quadrant of left breast in female, estrogen receptor positive ([place]) 11/2016   oncologist-- dr Burr Medico--- dx 10/ 2018  ,  s/p left lumptectomy with node dissection;  completed radiation 04-15-2017, no chemo   Multiple thyroid nodules    in care everywhere pt had left thyroid nodule biospy 09-18-2014 benign   NICM (nonischemic cardiomyopathy) ([place]) 2016   2016  ef 30%;  last echo in epic ef 45%   OA (osteoarthritis)    Osteoporosis    Paroxysmal atrial fibrillation [facility])    cardiologist--- dr end   Personal history of radiation therapy    left breast cancer  03-08-2017  to 04-05-2017   Thrombocytopenia [facility])    followed by dr Burr Medico   Valvular stenosis, aortic 03/12/2014   Status post bioprosthetic AVR  in [place]  for severe AS   Wears dentures    full upper   Past Surgical History:  Procedure Laterality Date   AORTIC VALVE REPLACEMENT  03/12/2014   [facility] in [place];  St Jude 95m, medial Trifecta Bioprosthesis   BREAST LUMPECTOMY WITH RADIOACTIVE SEED AND SENTINEL LYMPH NODE BIOPSY Left 01/14/2017   Procedure: LEFT BREAST LUMPECTOMY WITH RADIOACTIVE SEED AND SENTINEL LYMPH NODE BIOPSY;  Surgeon: WDonne Hazel  Rodman Key, MD;  Location: [facility];  Service: General;  Laterality: Left;   CARDIAC CATHETERIZATION  02/12/2014   [facility] in [place] D & C N/A 11/17/2020   Procedure: DILATATION AND CURETTAGE /HYSTEROSCOPY WITH MYOSURE;  Surgeon: Nunzio Cobbs, MD;  Location: [facility];  Service: Gynecology;  Laterality: N/A;   INTRAMEDULLARY (IM) NAIL INTERTROCHANTERIC Left 12/10/2015   Procedure: INTRAMEDULLARY (IM) NAIL INTERTROCHANTRIC;  Surgeon: Leandrew Koyanagi, MD;  Location: [facility];  Service: Orthopedics;  Laterality: Left;   OPERATIVE ULTRASOUND N/A 11/17/2020   Procedure: OPERATIVE ULTRASOUND;  Surgeon: Nunzio Cobbs, MD;  Location: [facility];  Service: Gynecology;   Laterality: N/A;   PLEURADESIS Left 12/03/2015   Procedure: PLEURADESIS;  Surgeon: Melrose Nakayama, MD;  Location: [facility];  Service: Thoracic;  Laterality: Left;   RESECTION OF APICAL BLEB Left 12/03/2015   Procedure: BLEBECTOMY;  Surgeon: Melrose Nakayama, MD;  Location: [facility];  Service: Thoracic;  Laterality: Left;   RIGHT/LEFT HEART CATH AND CORONARY ANGIOGRAPHY N/A 03/10/2020   Procedure: RIGHT/LEFT HEART CATH AND CORONARY ANGIOGRAPHY;  Surgeon: [name], MD;  Location: [facility];  Service: Cardiovascular;  Laterality: N/A;   VIDEO ASSISTED THORACOSCOPY Left 12/03/2015   Procedure: VIDEO ASSISTED THORACOSCOPY;  Surgeon: Melrose Nakayama, MD;  Location: [facility];  Service: Thoracic;  Laterality: Left;   VIDEO ASSISTED THORACOSCOPY (VATS) W/TALC PLEUADESIS Right 11/19/2014   in [place]   Patient Active Problem List   Diagnosis Date Noted   Depression, major, single episode, moderate ([place]) 12/08/2020   Gait abnormality 07/31/2020   Paresthesia 07/31/2020   Low back pain with bilateral sciatica 07/31/2020   Pneumonia due to COVID-19 virus    Bacteremia due to Gram-positive bacteria 08/29/2018   Abscess    Cellulitis of left breast 08/28/2018   Cellulitis of left abdominal wall 08/28/2018   Encounter for routine gynecological examination 04/28/2018   Stage 3 chronic kidney disease ([place]) 09/27/2017   Recurrent genital herpes 04/13/2017   Chronic pain disorder 01/25/2017   Malignant neoplasm of upper-outer quadrant of left breast in female, estrogen receptor positive ([place]) 12/16/2016   Chronic knee pain 12/07/2016   Chronic bilateral low back pain without sciatica 12/07/2016   Peripheral musculoskeletal gait disorder 12/07/2016   Encounter for therapeutic drug monitoring 06/16/2016   Generalized osteoarthritis of multiple sites 05/04/2016   Aortic valve disease 04/10/2016   S/P AVR 04/10/2016   NICM (nonischemic cardiomyopathy) ([place]) 04/10/2016   Upper  airway cough syndrome 03/14/2016   Morbid obesity ([place]) 03/14/2016   COPD (chronic obstructive pulmonary disease) ([place]) 03/11/2016   Thrombocytopenia ([place]) 12/12/2015   Anemia 12/12/2015   Vitamin D deficiency 12/12/2015   Acute urinary retention 12/11/2015   Osteoporosis 12/11/2015   Hip fracture ([place]) 12/10/2015   Aortic atherosclerosis ([place]) 12/10/2015   Lung blebs ([place]) 12/03/2015   Pelvic fracture ([place]) 08/19/2015   Fall at home 08/19/2015   Essential hypertension 08/19/2015   Mixed hyperlipidemia 08/19/2015   Asthma 08/19/2015   Paroxysmal atrial fibrillation ([place]) 08/19/2015   Hyperthyroidism 08/19/2015   Chronic HFrEF (heart failure with reduced ejection fraction) ([place])    Coronary artery disease    Aortic stenosis     PCP: [name], [name], MD   REFERRING PROVIDER: Dr Jone Baseman   REFERRING DIAG:  [phone] (ICD-10-CM) - Spondylosis without myelopathy or radiculopathy, lumbar region  M25.561,M25.562,G89.29 (ICD-10-CM) - Chronic pain of both knees  THERAPY DIAG:  Low Back Pain Bilateral Knee Pain L>R    ONSET DATE: Several years    SUBJECTIVE:    SUBJECTIVE STATEMENT:  Patient continues to report pain in both knees. She reports it was no worse after the last visit.   PERTINENT HISTORY: OA of the knees and back, anxiety, CHF, COPD, chronic constipation   PAIN:  Are you having pain? No VAS scale: 5/10 Pain location: Knee Pain orientation: Left/ Right knee  PAIN TYPE: aching Pain description: intermittent ( with movement and walking)  Aggravating factors: Standing, walking  Relieving factors: Tramadol, Voltaren    Today's treatment   10/18   Pt seen for aquatic therapy today.  Treatment took place in water 3.25-4 ft in depth at the [employer] pool. Temp of water was 91.  Pt entered/exited the pool via stairs (step through pattern) independently with bilat rail.   Patient entered the water with min a for balance. Next visit we will  have her enter with the cahir lift.   She used the water walker. With PT control the patient walked across the pool 3x.    Standing at the wall 2x  supported march x10; standing hip abduction x10; standing hip extension x10 bilateral    Attempted to have patient sit on the step. Patient had a hard time finding her balance. Her feet floated up. SHe could maintain balance only with assist. LAQ with assist for balance x20; march with assist for balance x20      Pt requires buoyancy for support and to offload joints with strengthening exercises. Viscosity of the water is needed for resistance of strengthening; water current perturbations provides challenge to standing balance unsupported, requiring increased core activation.  10/21  Pt seen for aquatic therapy today.  Treatment took place in water 3.25-4 ft in depth at the [employer] pool. Temp of water was 91.     Patient entered with chair lift. She required mod a to get to the chair lift. She tried to sit before she was too the chair. She required that chair lift to get out as well and mod a+2 transfer back to transport chair    She used the water walker. With PT control the patient walked across the pool 2x2. She required support of the water walker    Standing at the wall 2x  supported march x10; standing hip abduction x10; standing hip extension x10 bilateral heel raise 2x10; bilateral knee bends 2x10 2 trials    Attempted to have patient sit on the step. Patient had a hard time finding her balance. Her feet floated up. She could maintain balance only with assist but required less assist today.  LAQ with assist for balance x20; march with assist for balance x20      Pt requires buoyancy for support and to offload joints with strengthening exercises. Viscosity of the water is needed for resistance of strengthening; water current perturbations provides challenge to standing balance unsupported, requiring increased core  activation.     ASSESSMENT:   CLINICAL IMPRESSION: Patient did better today, but she still remains limited. She requires frequent cuing for proper use of the water walker. She tries to turn in the walker rather then turn with the walker. She is afraid of the water which causes her to tense and increases her fall risk. She did do better today with her fear of water. Therapy talked to her today about safety in the water. We will trial a few more visits  with the hope that her fear of the water will improve as well as her safety.    Abnormal gait, decreased activity tolerance, decreased balance, decreased endurance, decreased knowledge of use of DME, decreased mobility, difficulty walking, decreased ROM, decreased strength, increased fascial restrictions, increased muscle spasms, improper body mechanics, obesity, and pain   cleaning, community activity, meal prep, laundry, shopping, and yard work   Age, Past/current experiences, and 3+ comorbidities: anxiety, COPD, OA in multi joints  REHAB POTENTIAL: Good   CLINICAL DECISION MAKING: Evolving/moderate complexity decreasing ability to walk    EVALUATION COMPLEXITY: Moderate     GOALS: Goals reviewed with patient? No   SHORT TERM GOALS:   STG Name Target Date Goal status  1 Patient will ambulate 78' without significant increase in lower back pain and dyspnea  Baseline:  01/05/2021 INITIAL  2 Patient will decrease 5x sit to stand test to 20 sec  Baseline:  01/05/2021 INITIAL  3 Patient will increase bilateral hip flexion to 4/5  Baseline: 01/05/2021 INITIAL    LONG TERM GOALS:    LTG Name Target Date Goal status  1 Patient will be independent with complete gym and pool program and figure out access to a pool for long term program  Baseline: 02/02/2021 INITIAL  2 Patient will ambulate 100' without significant lower back pain in order to imrove ability to walk in the community.  Baseline: 02/02/2021 INITIAL  3 Patient will stand for 20  min without increased low back and knee pain in order to do household chores  Baseline: 02/02/2021 INITIAL  PLAN: PT FREQUENCY: 2x/week   PT DURATION: 8 weeks   PLANNED INTERVENTIONS: Therapeutic exercises, Therapeutic activity, Neuro Muscular re-education, Balance training, Gait training, Patient/Family education, Joint mobilization, DME instructions, Aquatic Therapy, Dry Needling, Electrical stimulation, Cryotherapy, Moist heat, Taping, Ultrasound, and Manual therapy   PLAN FOR NEXT SESSION: begin basic hip and core exercises in the water. Work on walking endurance in the water. Work on ambulation with proper posture in the water.      PHYSICAL THERAPY DISCHARGE SUMMARY  Visits from Start of Care: 3  Current functional level related to goals / functional outcomes: Patient had an acute sprain of her knee and did not return to therapy    Remaining deficits: Unknown   Education / Equipment: Has pool exercises    Patient agrees to discharge. Patient goals were not met. Patient is being discharged due to a change in medical status.    Carney Living PT DPT  12/19/2020, 1:55 PM

## 2020-12-20 ENCOUNTER — Encounter (HOSPITAL_BASED_OUTPATIENT_CLINIC_OR_DEPARTMENT_OTHER): Payer: Self-pay | Admitting: Emergency Medicine

## 2020-12-20 ENCOUNTER — Emergency Department (HOSPITAL_BASED_OUTPATIENT_CLINIC_OR_DEPARTMENT_OTHER): Payer: Medicare Other | Admitting: Radiology

## 2020-12-20 ENCOUNTER — Other Ambulatory Visit: Payer: Self-pay

## 2020-12-20 ENCOUNTER — Emergency Department (HOSPITAL_BASED_OUTPATIENT_CLINIC_OR_DEPARTMENT_OTHER)
Admission: EM | Admit: 2020-12-20 | Discharge: 2020-12-20 | Disposition: A | Discharge status: Home / Self Care | Payer: Medicare Other | Attending: Emergency Medicine | Admitting: Emergency Medicine

## 2020-12-20 DIAGNOSIS — Z79899 Other long term (current) drug therapy: Secondary | ICD-10-CM | POA: Diagnosis not present

## 2020-12-20 DIAGNOSIS — I130 Hypertensive heart and chronic kidney disease with heart failure and stage 1 through stage 4 chronic kidney disease, or unspecified chronic kidney disease: Secondary | ICD-10-CM | POA: Insufficient documentation

## 2020-12-20 DIAGNOSIS — Z7901 Long term (current) use of anticoagulants: Secondary | ICD-10-CM | POA: Diagnosis not present

## 2020-12-20 DIAGNOSIS — S8391XA Sprain of unspecified site of right knee, initial encounter: Secondary | ICD-10-CM | POA: Diagnosis not present

## 2020-12-20 DIAGNOSIS — I480 Paroxysmal atrial fibrillation: Secondary | ICD-10-CM | POA: Insufficient documentation

## 2020-12-20 DIAGNOSIS — I2510 Atherosclerotic heart disease of native coronary artery without angina pectoris: Secondary | ICD-10-CM | POA: Diagnosis not present

## 2020-12-20 DIAGNOSIS — N183 Chronic kidney disease, stage 3 unspecified: Secondary | ICD-10-CM | POA: Insufficient documentation

## 2020-12-20 DIAGNOSIS — Z8616 Personal history of COVID-19: Secondary | ICD-10-CM | POA: Insufficient documentation

## 2020-12-20 DIAGNOSIS — I5022 Chronic systolic (congestive) heart failure: Secondary | ICD-10-CM | POA: Diagnosis not present

## 2020-12-20 DIAGNOSIS — J449 Chronic obstructive pulmonary disease, unspecified: Secondary | ICD-10-CM | POA: Insufficient documentation

## 2020-12-20 DIAGNOSIS — M25561 Pain in right knee: Secondary | ICD-10-CM | POA: Insufficient documentation

## 2020-12-20 DIAGNOSIS — Z87891 Personal history of nicotine dependence: Secondary | ICD-10-CM | POA: Diagnosis not present

## 2020-12-20 DIAGNOSIS — S8991XA Unspecified injury of right lower leg, initial encounter: Secondary | ICD-10-CM | POA: Diagnosis not present

## 2020-12-20 MED ORDER — OXYCODONE-ACETAMINOPHEN 5-325 MG PO TABS
1.0000 | ORAL_TABLET | Freq: Once | ORAL | Status: AC
  Administered 2020-12-20: 1 via ORAL
  Filled 2020-12-20: qty 1

## 2020-12-20 NOTE — ED Notes (Signed)
Pt took 1300mg  tylenol and 100mg  tramadol at approx 9am this morning

## 2020-12-20 NOTE — Discharge Instructions (Addendum)
You were seen and evaluated in the emergency department today for evaluation of knee pain.  As we discussed, there was no fracture or dislocation on imaging.  There still could be some component of ligamentous strain or tear.  I would like for you to follow-up with your primary care provider for further evaluation within the next week.  Please take Tylenol scheduled for adequate pain control.  Please return to the emergency department if you experience worsening knee pain, numbness in your toes, significant limited range of motion, swelling, erythema, or other concerns you might have.

## 2020-12-20 NOTE — ED Provider Notes (Signed)
[facility] EMERGENCY DEPT Provider Note   CSN: [id number] Arrival date & time: 12/20/20  1315     History Chief Complaint  Patient presents with   Knee Pain    Danielle [name] is a 75 y.o. female who presents the emergency department for further evaluation of right knee pain that began yesterday.  She states that she was in the shower on seat when she lowered her self to the floor and was unable to get up.  She is upon trying to get up she felt a twisting sensation in her knee and has been unable to bear weight on it since.  Has been constant worsening since onset.  She rates her knee pain moderate in severity.  It is worse with any movement.  She denies any other injury.  Typically walks with a walker.   Knee Pain     Past Medical History:  Diagnosis Date   Anticoagulated on Coumadin    managed by cardiology   Anxiety    Chronic constipation    Chronic systolic CHF (congestive heart failure) ([place]) 2016   cardiologist--- dr end and followed by CHF clinic   CKD (chronic kidney disease), stage III ([place])    COPD (chronic obstructive pulmonary disease) ([place])    previous had seen pulmonology --- dr Melvyn Novas, note in epic 03-14-2016, dx COPD GOLD 0   Coronary artery disease    cardiologist--- dr Durward Mallard;  last cardiac cath 01-10-20202 mild nonobstructive disease proxLAD;  per pt, had LHC prior to AVR in [place] 2016   DOE (dyspnea on exertion)    GERD (gastroesophageal reflux disease)    History of 2019 novel coronavirus disease (COVID-19) 12/19/2019   hospital admission w/ covid pneumonia , no intubatiion,  per pt symptoms resolved and back to baseline   History of cerebrovascular accident (CVA) with residual deficit    CVA in 2000 without residuals;  CVA post op AVR 01/ 2016 with residual right hand weakness   History of gastritis    History of pneumothorax    s/p right vats 11-19-2014 and  s/p left vats  12-13-2015   (both spontenaous due to bleb)    Hyperlipidemia    Hypertension    Hyperthyroidism    per pt followed by pcp,  dx approx 2014   Malignant neoplasm of upper-outer quadrant of left breast in female, estrogen receptor positive ([place]) 11/2016   oncologist-- dr Burr Medico--- dx 10/ 2018  ,  s/p left lumptectomy with node dissection;  completed radiation 04-15-2017, no chemo   Multiple thyroid nodules    in care everywhere pt had left thyroid nodule biospy 09-18-2014 benign   NICM (nonischemic cardiomyopathy) ([place]) 2016   2016  ef 30%;  last echo in epic ef 45%   OA (osteoarthritis)    Osteoporosis    Paroxysmal atrial fibrillation [facility])    cardiologist--- dr end   Personal history of radiation therapy    left breast cancer  03-08-2017  to 04-05-2017   Thrombocytopenia [facility])    followed by dr Burr Medico   Valvular stenosis, aortic 03/12/2014   Status post bioprosthetic AVR  in [place]  for severe AS   Wears dentures    full upper    Patient Active Problem List   Diagnosis Date Noted   Depression, major, single episode, moderate ([place]) 12/08/2020   Gait abnormality 07/31/2020   Paresthesia 07/31/2020   Low back pain with bilateral sciatica 07/31/2020   Pneumonia due to COVID-19 virus  Bacteremia due to Gram-positive bacteria 08/29/2018   Abscess    Cellulitis of left breast 08/28/2018   Cellulitis of left abdominal wall 08/28/2018   Encounter for routine gynecological examination 04/28/2018   Stage 3 chronic kidney disease ([place]) 09/27/2017   Recurrent genital herpes 04/13/2017   Chronic pain disorder 01/25/2017   Malignant neoplasm of upper-outer quadrant of left breast in female, estrogen receptor positive ([place]) 12/16/2016   Chronic knee pain 12/07/2016   Chronic bilateral low back pain without sciatica 12/07/2016   Peripheral musculoskeletal gait disorder 12/07/2016   Encounter for therapeutic drug monitoring 06/16/2016   Generalized osteoarthritis of multiple sites 05/04/2016   Aortic valve disease 04/10/2016   S/P  AVR 04/10/2016   NICM (nonischemic cardiomyopathy) ([place]) 04/10/2016   Upper airway cough syndrome 03/14/2016   Morbid obesity ([place]) 03/14/2016   COPD (chronic obstructive pulmonary disease) ([place]) 03/11/2016   Thrombocytopenia ([place]) 12/12/2015   Anemia 12/12/2015   Vitamin D deficiency 12/12/2015   Acute urinary retention 12/11/2015   Osteoporosis 12/11/2015   Hip fracture ([place]) 12/10/2015   Aortic atherosclerosis ([place]) 12/10/2015   Lung blebs ([place]) 12/03/2015   Pelvic fracture ([place]) 08/19/2015   Fall at home 08/19/2015   Essential hypertension 08/19/2015   Mixed hyperlipidemia 08/19/2015   Asthma 08/19/2015   Paroxysmal atrial fibrillation ([place]) 08/19/2015   Hyperthyroidism 08/19/2015   Chronic HFrEF (heart failure with reduced ejection fraction) ([place])    Coronary artery disease    Aortic stenosis     Past Surgical History:  Procedure Laterality Date   AORTIC VALVE REPLACEMENT  03/12/2014   [facility] in [place];  St Jude 60mm, medial Trifecta Bioprosthesis   BREAST LUMPECTOMY WITH RADIOACTIVE SEED AND SENTINEL LYMPH NODE BIOPSY Left 01/14/2017   Procedure: LEFT BREAST LUMPECTOMY WITH RADIOACTIVE SEED AND SENTINEL LYMPH NODE BIOPSY;  Surgeon: Rolm Bookbinder, MD;  Location: [facility];  Service: General;  Laterality: Left;   CARDIAC CATHETERIZATION  02/12/2014   [facility] in [place] D & C N/A 11/17/2020   Procedure: DILATATION AND CURETTAGE /HYSTEROSCOPY WITH MYOSURE;  Surgeon: Nunzio Cobbs, MD;  Location: [facility];  Service: Gynecology;  Laterality: N/A;   INTRAMEDULLARY (IM) NAIL INTERTROCHANTERIC Left 12/10/2015   Procedure: INTRAMEDULLARY (IM) NAIL INTERTROCHANTRIC;  Surgeon: Leandrew Koyanagi, MD;  Location: [facility];  Service: Orthopedics;  Laterality: Left;   OPERATIVE ULTRASOUND N/A 11/17/2020   Procedure: OPERATIVE ULTRASOUND;  Surgeon: Nunzio Cobbs, MD;  Location[facility];  Service:  Gynecology;  Laterality: N/A;   PLEURADESIS Left 12/03/2015   Procedure: PLEURADESIS;  Surgeon: Melrose Nakayama, MD;  Location: [facility];  Service: Thoracic;  Laterality: Left;   RESECTION OF APICAL BLEB Left 12/03/2015   Procedure: BLEBECTOMY;  Surgeon: Melrose Nakayama, MD;  Location: [facility];  Service: Thoracic;  Laterality: Left;   RIGHT/LEFT HEART CATH AND CORONARY ANGIOGRAPHY N/A 03/10/2020   Procedure: RIGHT/LEFT HEART CATH AND CORONARY ANGIOGRAPHY;  Surgeon: [name], MD;  Location: [facility] CV LAB;  Service: Cardiovascular;  Laterality: N/A;   VIDEO ASSISTED THORACOSCOPY Left 12/03/2015   Procedure: VIDEO ASSISTED THORACOSCOPY;  Surgeon: Melrose Nakayama, MD;  Location: [facility] OR;  Service: Thoracic;  Laterality: Left;   VIDEO ASSISTED THORACOSCOPY (VATS) W/TALC PLEUADESIS Right 11/19/2014   in [place]     OB History     Gravida  0   Para  0   Term  0   Preterm  0   AB  0   Living  0      SAB  0   IAB  0   Ectopic  0   Multiple  0   Live Births  0           Family History  Problem Relation Age of Onset   Diabetes Mother    Heart attack Mother 71   Diabetes Father    Lung cancer Father    Diabetes Sister    Thyroid disease Sister    Diabetes Sister    HIV Brother     Social History   Tobacco Use   Smoking status: Former    Packs/day: 0.25    Years: 10.00    Pack years: 2.50    Types: Cigarettes    Quit date: 2012    Years since quitting: 10.8   Smokeless tobacco: Never  Vaping Use   Vaping Use: Never used  Substance Use Topics   Alcohol use: No   Drug use: Never    Home Medications Prior to Admission medications   Medication Sig Start Date End Date Taking? Authorizing Provider  traMADol (ULTRAM) 50 MG tablet TAKE 1 TABLET BY MOUTH THREE TIMES DAILY 12/17/20   Kirsteins, Luanna Salk, MD  albuterol (PROVENTIL HFA;VENTOLIN HFA) 108 (90 Base) MCG/ACT inhaler Inhale 2 puffs into the lungs every 6 (six) hours as needed  for wheezing or shortness of breath. 12/11/14   [provider]  alendronate (FOSAMAX) 70 MG tablet Take 1 tablet (70 mg total) by mouth once a week. Take with a full glass of water on an empty stomach. 07/24/19   [name], [name], MD  apixaban (ELIQUIS) 5 MG TABS tablet Take 1 tablet (5 mg total) by mouth 2 (two) times daily. 12/17/20   Larey Dresser, MD  ascorbic acid (VITAMIN C) 500 MG tablet Take 500 mg by mouth daily.    [provider]  budesonide-formoterol (SYMBICORT) 160-4.5 MCG/ACT inhaler Inhale 2 puffs into the lungs daily as needed. 11/25/20   [name], [name], MD  carvedilol (COREG) 12.5 MG tablet TAKE 1 TABLET BY MOUTH  TWICE DAILY WITH A MEAL 12/18/20   End, Harrell Gave, MD  Cholecalciferol 25 MCG (1000 UT) tablet Take 1,000 Units by mouth daily.    [provider]  dapagliflozin propanediol (FARXIGA) 10 MG TABS tablet Take 1 tablet (10 mg total) by mouth daily before breakfast. 09/05/20   Larey Dresser, MD  DULoxetine (CYMBALTA) 30 MG capsule Take 1 capsule (30 mg total) by mouth daily. 12/08/20   [name], [name], MD  fluticasone (FLONASE) 50 MCG/ACT nasal spray Place 1 spray into both nostrils daily. 01/30/20   [name], [name], MD  linaclotide [facility]) 145 MCG CAPS capsule Take 1 capsule (145 mcg total) by mouth daily before breakfast. 12/08/20   [name], [name], MD  methimazole (TAPAZOLE) 5 MG tablet Take 3 tablets (15 mg total) by mouth daily. 04/08/20   [name], [name], MD  pantoprazole (PROTONIX) 40 MG tablet Take 1 tablet (40 mg total) by mouth daily. 10/06/20   Larey Dresser, MD  rosuvastatin (CRESTOR) 40 MG tablet TAKE 1 TABLET BY MOUTH  DAILY 06/09/20   [name], [name], MD  sacubitril-valsartan (ENTRESTO) 49-51 MG Take 1 tablet by mouth 2 (two) times daily.    [provider]  senna-docusate (SENOKOT-S) 8.6-50 MG tablet Take 2 tablets by mouth at bedtime.    [provider]  spironolactone (ALDACTONE) 25 MG  tablet Take 1 tablet (25  mg total) by mouth daily. 12/17/20   Larey Dresser, MD  torsemide [facility]) 20 MG tablet Take 40mg  daily alternating with 20mg  every other day 12/17/20   Larey Dresser, MD  trolamine salicylate (ASPERCREME) 10 % cream Apply 1 application topically as needed for muscle pain.    [provider]  valACYclovir (VALTREX) 500 MG tablet Take on tablet by mouth twice a day for 3 days as needed for an outbreak 11/10/20   Nunzio Cobbs, MD    Allergies    Lisinopril, Tetanus toxoid adsorbed, and Other  Review of Systems   Review of Systems  All other systems reviewed and are negative.  Physical Exam Updated Vital Signs BP 128/83 (BP Location: Right Wrist)   Pulse 83   Temp 98.2 F (36.8 C)   Resp (!) 22   SpO2 97%   Physical Exam Vitals and nursing note reviewed.  Constitutional:      Appearance: Normal appearance. She is obese.  HENT:     Head: Normocephalic and atraumatic.  Eyes:     General:        Right eye: No discharge.        Left eye: No discharge.     Conjunctiva/sclera: Conjunctivae normal.  Pulmonary:     Effort: Pulmonary effort is normal.  Musculoskeletal:     Comments: Right knee is diffusely tender.  She has limited range of motion secondary to pain.  She is neurovascularly intact at the ankle and foot.  2+ dorsalis pedis pulse on the right.  Skin:    General: Skin is warm and dry.     Findings: No rash.  Neurological:     General: No focal deficit present.     Mental Status: She is alert.  Psychiatric:        Mood and Affect: Mood normal.        Behavior: Behavior normal.    ED Results / Procedures / Treatments   Labs (all labs ordered are listed, but only abnormal results are displayed) Labs Reviewed - No data to display  EKG None  Radiology DG Knee Complete 4 Views Right  Result Date: 12/20/2020 CLINICAL DATA:  Pain and injury EXAM: RIGHT KNEE - COMPLETE 4+ VIEW COMPARISON:  None. FINDINGS: No evidence of fracture,  dislocation, or joint effusion. Severe joint space narrowing with osteophyte formation of the medial tibiofemoral compartment. Associated to a lesser degree osteophyte formation and joint space narrowing along the lateral tibiofemoral compartment. Also noted is severe patellofemoral joint space narrowing. No aggressive appearing focal bone abnormality. Soft tissues are unremarkable. Vascular calcifications. IMPRESSION: Severe degenerative changes. Electronically Signed   By: Iven Finn M.D.   On: 12/20/2020 15:04    Procedures Procedures   Medications Ordered in ED Medications  oxyCODONE-acetaminophen (PERCOCET/ROXICET) 5-325 MG per tablet 1 tablet (1 tablet Oral Given 12/20/20 1614)    ED Course  I have reviewed the triage vital signs and the nursing notes.  Pertinent labs & imaging results that were available during my care of the patient were reviewed by me and considered in my medical decision making (see chart for details).    MDM Rules/Calculators/A&P                          Danielle [name] is a 75 y.o. female who presents to the emergency department for for evaluation of right knee pain.  History and  physical exam is less concerning for significant ligamentous injury at this time.  I have a low suspicion for knee fracture or dislocation.  I doubt any infectious etiology at this time.  Knee x-ray was negative for any fracture.  I will place the patient in a knee immobilizer and have her follow-up with primary care.  Her pain was adequate control with 1 Percocet.  Patient and family is amendable to this plan.  All questions and concerns addressed.  She is safe for discharge.   Final Clinical Impression(s) / ED Diagnoses Final diagnoses:  Acute pain of right knee    Rx / DC Orders ED Discharge Orders     None        Cherrie Gauze 12/20/20 1618    Truddie Hidden, MD 12/20/20 [facility]

## 2020-12-20 NOTE — ED Notes (Signed)
Pt NAD, a/ox4. Pt verbalizes understanding of all DC and f/u instructions. Pt verbalizes understanding of knee immobilizer application and care. All questions answered. Pt wheeled to lobby in w/c by ED techs and spouse.

## 2020-12-20 NOTE — ED Triage Notes (Addendum)
Pt slid off of chair in shower yesterday and needed assistance getting up. In the process of getting up off the shower floor, pt felt her left knee give out on her and pain in her right knee.  Per son, pt has been unable to weight bear on either knee since.  Pt states only right knee is painful.

## 2020-12-20 NOTE — ED Notes (Signed)
Pt mild distress in bed. A/ox4. Pt states she was getting out of shower, twisted and injured her knee. Pt has limited ROM to right leg. +pmsc.

## 2020-12-23 ENCOUNTER — Ambulatory Visit: Payer: Medicare Other

## 2020-12-23 ENCOUNTER — Ambulatory Visit (HOSPITAL_BASED_OUTPATIENT_CLINIC_OR_DEPARTMENT_OTHER): Payer: Medicare Other | Admitting: Physical Therapy

## 2020-12-24 ENCOUNTER — Ambulatory Visit: Payer: Medicare Other

## 2020-12-24 ENCOUNTER — Ambulatory Visit (INDEPENDENT_AMBULATORY_CARE_PROVIDER_SITE_OTHER): Payer: Medicare Other | Admitting: Family Medicine

## 2020-12-24 ENCOUNTER — Other Ambulatory Visit: Payer: Self-pay

## 2020-12-24 ENCOUNTER — Encounter: Payer: Self-pay | Admitting: Family Medicine

## 2020-12-24 VITALS — BP 130/80 | HR 69 | Resp 16 | Ht 67.0 in

## 2020-12-24 DIAGNOSIS — N938 Other specified abnormal uterine and vaginal bleeding: Secondary | ICD-10-CM

## 2020-12-24 DIAGNOSIS — L292 Pruritus vulvae: Secondary | ICD-10-CM | POA: Diagnosis not present

## 2020-12-24 DIAGNOSIS — M159 Polyosteoarthritis, unspecified: Secondary | ICD-10-CM

## 2020-12-24 DIAGNOSIS — G4700 Insomnia, unspecified: Secondary | ICD-10-CM | POA: Diagnosis not present

## 2020-12-24 DIAGNOSIS — S8391XD Sprain of unspecified site of right knee, subsequent encounter: Secondary | ICD-10-CM | POA: Diagnosis not present

## 2020-12-24 DIAGNOSIS — F321 Major depressive disorder, single episode, moderate: Secondary | ICD-10-CM

## 2020-12-24 MED ORDER — FLUCONAZOLE 150 MG PO TABS: 150.0000 mg | ORAL_TABLET | Freq: Once | ORAL | 0 refills | Status: AC

## 2020-12-24 MED ORDER — MIRTAZAPINE 15 MG PO TABS: 15.0000 mg | ORAL_TABLET | Freq: Every day | ORAL | 0 refills | Status: DC

## 2020-12-24 MED ORDER — TERCONAZOLE 0.4 % VA CREA: 1.0000 | TOPICAL_CREAM | Freq: Every day | VAGINAL | 0 refills | Status: DC

## 2020-12-24 NOTE — Progress Notes (Signed)
HPI: [name] is a 75 y.o. female  with hx of vit D def,hyperthyroidism,HFrEF,atrial fib, CKD III, and aortic valve disease s/p AVR on chronic anticoagulationwho is here today to follow on recent ED visit.  She was in the ED on 12/20/2020 due to severe right knee pain that started the day before evaluation.  She was in the shower seat when she lost balance and to prevent a hard fall she lowered her self to the floor. When trying to get up she developed sudden knee pain, she felt a "twist "Husband could not get her up, so he called EMS . She was placed on a knee immobilizer, no new meds added.  Pain has improved, "not bad" and now she is able to bare wt with right LE. She was not able to do her routine pool exercises due to pain. No erythema or ecchymosis.  Right X ray showed severe degenerative changes, no evidence of fracture,dislocation,or joint effusion.  Today she is c/o having a "yeast" infection, which she ascribed to a wick urine cat placed in the hospital during ED visit. States that "every time" she has a urine cath she gets yeast infection.No discharge but erythema and pruritus. Sometimes she causes bleeding from scratching. She has not tried OTC medications.  She also mentions vaginal spotting for the past few days.She has had this problem before, s/p D&C.  She follows with gyn, she was told to arrange appt if this happens again.  She was last seen on 12/08/20, Duloxetine 30 mg was started to help with anxiety,depression,and chronic pain. Sleeping about 2-3 hours.  Duloxetine 30 mg has not helped with mood ,sleep,or pain. Problems are not getting worse. Negative for suicidal thoughts. No side effects.  She is on Tramadol for chronic pain. She is not longer on Coumadin, was changed to Eliquis 5 mg bid.  Estimated Creatinine Clearance: 43.1 mL/min (A) (by C-G formula based on SCr of 1.53 mg/dL (H)).  Review of Systems  Constitutional:  Positive  for activity change and fatigue. Negative for chills and fever.  HENT:  Negative for nosebleeds and sore throat.   Respiratory:  Negative for cough, shortness of breath and wheezing.   Cardiovascular:  Negative for chest pain and palpitations.  Gastrointestinal:  Negative for abdominal pain, nausea and vomiting.  Genitourinary:  Negative for decreased urine volume, dysuria and hematuria.  Musculoskeletal:  Positive for arthralgias and gait problem.  Neurological:  Negative for syncope, facial asymmetry, weakness and headaches.  Psychiatric/Behavioral:  Positive for sleep disturbance. Negative for confusion. The patient is nervous/anxious.   Rest see pertinent positives and negatives per HPI.  Current Outpatient Medications on File Prior to Visit  Medication Sig Dispense Refill   albuterol (PROVENTIL HFA;VENTOLIN HFA) 108 (90 Base) MCG/ACT inhaler Inhale 2 puffs into the lungs every 6 (six) hours as needed for wheezing or shortness of breath.     alendronate (FOSAMAX) 70 MG tablet Take 1 tablet (70 mg total) by mouth once a week. Take with a full glass of water on an empty stomach. 13 tablet 3   apixaban (ELIQUIS) 5 MG TABS tablet Take 1 tablet (5 mg total) by mouth 2 (two) times daily. 60 tablet 5   ascorbic acid (VITAMIN C) 500 MG tablet Take 500 mg by mouth daily.     budesonide-formoterol (SYMBICORT) 160-4.5 MCG/ACT inhaler Inhale 2 puffs into the lungs daily as needed. 3 each 3   carvedilol (COREG) 12.5 MG tablet TAKE 1 TABLET BY MOUTH  TWICE DAILY WITH A MEAL 180 tablet 0   Cholecalciferol 25 MCG (1000 UT) tablet Take 1,000 Units by mouth daily.     dapagliflozin propanediol (FARXIGA) 10 MG TABS tablet Take 1 tablet (10 mg total) by mouth daily before breakfast. 30 tablet 3   DULoxetine (CYMBALTA) 30 MG capsule Take 1 capsule (30 mg total) by mouth daily. 30 capsule 1   fluticasone (FLONASE) 50 MCG/ACT nasal spray Place 1 spray into both nostrils daily. 16 g 3   linaclotide (LINZESS) 145  MCG CAPS capsule Take 1 capsule (145 mcg total) by mouth daily before breakfast. 30 capsule 1   methimazole (TAPAZOLE) 5 MG tablet Take 3 tablets (15 mg total) by mouth daily. 270 tablet 2   pantoprazole (PROTONIX) 40 MG tablet Take 1 tablet (40 mg total) by mouth daily. 30 tablet 11   rosuvastatin (CRESTOR) 40 MG tablet TAKE 1 TABLET BY MOUTH  DAILY 90 tablet 3   sacubitril-valsartan (ENTRESTO) 49-51 MG Take 1 tablet by mouth 2 (two) times daily.     senna-docusate (SENOKOT-S) 8.6-50 MG tablet Take 2 tablets by mouth at bedtime.     spironolactone (ALDACTONE) 25 MG tablet Take 1 tablet (25 mg total) by mouth daily. 90 tablet 3   torsemide (DEMADEX) 20 MG tablet Take 40mg  daily alternating with 20mg  every other day 90 tablet 3   traMADol (ULTRAM) 50 MG tablet TAKE 1 TABLET BY MOUTH THREE TIMES DAILY 90 tablet 5   trolamine salicylate (ASPERCREME) 10 % cream Apply 1 application topically as needed for muscle pain.     valACYclovir (VALTREX) 500 MG tablet Take on tablet by mouth twice a day for 3 days as needed for an outbreak 30 tablet 0   No current facility-administered medications on file prior to visit.    Past Medical History:  Diagnosis Date   Anticoagulated on Coumadin    managed by cardiology   Anxiety    Chronic constipation    Chronic systolic CHF (congestive heart failure) ([place]) 2016   cardiologist--- dr end and followed by CHF clinic   CKD (chronic kidney disease), stage III ([place])    COPD (chronic obstructive pulmonary disease) ([place])    previous had seen pulmonology --- dr Melvyn Novas, note in epic 03-14-2016, dx COPD GOLD 0   Coronary artery disease    cardiologist--- dr Durward Mallard;  last cardiac cath 01-10-20202 mild nonobstructive disease proxLAD;  per pt, had LHC prior to AVR in [place] 2016   DOE (dyspnea on exertion)    GERD (gastroesophageal reflux disease)    History of 2019 novel coronavirus disease (COVID-19) 12/19/2019   hospital admission w/ covid pneumonia , no intubatiion,   per pt symptoms resolved and back to baseline   History of cerebrovascular accident (CVA) with residual deficit    CVA in 2000 without residuals;  CVA post op AVR 01/ 2016 with residual right hand weakness   History of gastritis    History of pneumothorax    s/p right vats 11-19-2014 and  s/p left vats  12-13-2015   (both spontenaous due to bleb)   Hyperlipidemia    Hypertension    Hyperthyroidism    per pt followed by pcp,  dx approx 2014   Malignant neoplasm of upper-outer quadrant of left breast in female, estrogen receptor positive ([place]) 11/2016   oncologist-- dr Burr Medico--- dx 10/ 2018  ,  s/p left lumptectomy with node dissection;  completed radiation 04-15-2017, no chemo   Multiple thyroid nodules  in care everywhere pt had left thyroid nodule biospy 09-18-2014 benign   NICM (nonischemic cardiomyopathy) ([place]) 2016   2016  ef 30%;  last echo in epic ef 45%   OA (osteoarthritis)    Osteoporosis    Paroxysmal atrial fibrillation [facility])    cardiologist--- dr end   Personal history of radiation therapy    left breast cancer  03-08-2017  to 04-05-2017   Thrombocytopenia [facility])    followed by dr Burr Medico   Valvular stenosis, aortic 03/12/2014   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   Wears dentures    full upper   Allergies  Allergen Reactions   Lisinopril Cough   Tetanus Toxoid Adsorbed Swelling    Arm swelling   Other Cough    PEANUTS    Social History   Socioeconomic History   Marital status: Married    Spouse name: Sherwood   Number of children: 0   Years of education: 12   Highest education level: 12th grade  Occupational History   Occupation: Retired in 2004  Tobacco Use   Smoking status: Former    Packs/day: 0.25    Years: 10.00    Pack years: 2.50    Types: Cigarettes    Quit date: 2012    Years since quitting: 10.8   Smokeless tobacco: Never  Vaping Use   Vaping Use: Never used  Substance and Sexual Activity   Alcohol use: No   Drug  use: Never   Sexual activity: Yes    Birth control/protection: Post-menopausal  Other Topics Concern   Not on file  Social History Narrative   Lives with husband.  Ambulated independently.   Right-handed.   No daily caffeine use.   Social Determinants of Health   Financial Resource Strain: Medium Risk   Difficulty of Paying Living Expenses: Somewhat hard  Food Insecurity: No Food Insecurity   Worried About Charity fundraiser in the Last Year: Never true   Ran Out of Food in the Last Year: Never true  Transportation Needs: No Transportation Needs   Lack of Transportation (Medical): No   Lack of Transportation (Non-Medical): No  Physical Activity: Sufficiently Active   Days of Exercise per Week: 7 days   Minutes of Exercise per Session: 30 min  Stress: No Stress Concern Present   Feeling of Stress : Only a little  Social Connections: Moderately Integrated   Frequency of Communication with Friends and Family: More than three times a week   Frequency of Social Gatherings with Friends and Family: More than three times a week   Attends Religious Services: More than 4 times per year   Active Member of Clubs or Organizations: No   Attends Archivist Meetings: Never   Marital Status: Married   Vitals:   12/24/20 1011  BP: 130/80  Pulse: 69  Resp: 16  SpO2: 98%   Body mass index is 41.1 kg/m.  Physical Exam Vitals and nursing note reviewed.  Constitutional:      General: She is not in acute distress.    Appearance: She is well-developed.  HENT:     Head: Normocephalic and atraumatic.     Mouth/Throat:     Mouth: Mucous membranes are moist.     Pharynx: Oropharynx is clear.  Eyes:     Conjunctiva/sclera: Conjunctivae normal.  Cardiovascular:     Rate and Rhythm: Normal rate and regular rhythm.     Heart sounds: Murmur heard.  Comments: Trace pitting RLE edema 1+ RLE edema. No calf tenderness. Varicose veins LE's,bilateral. Pulmonary:     Effort:  Pulmonary effort is normal. No respiratory distress.     Breath sounds: Normal breath sounds.  Abdominal:     Palpations: Abdomen is soft.     Tenderness: There is no abdominal tenderness.  Musculoskeletal:     Comments: Tight sweet pant and limited mobility, I could not see knee. Limited flexion, crepitus with ROM and pain elicited. Pain upon palpation of medial inter linear joint and mild on lateral.  Skin:    General: Skin is warm.     Findings: No erythema or rash.  Neurological:     General: No focal deficit present.     Mental Status: She is alert and oriented to person, place, and time.     Cranial Nerves: No cranial nerve deficit.     Comments: In a wheel chair.  Psychiatric:        Mood and Affect: Mood is anxious.   ASSESSMENT AND PLAN:  Ms.Galilea was seen today for hospitalization follow-up.  Diagnoses and all orders for this visit:  Sprain of right knee, unspecified ligament, subsequent encounter Pain has improved. Continue ROM exercises and go back to pool exercises next week. I do not think further imaging is needed at this time.  Insomnia, unspecified type Problem is not well controlled. Good sleep hygiene. Mirtazapine 15 mg , 1/2 tab at bedtime. Side effect discussed.  -     mirtazapine (REMERON) 15 MG tablet; Take 1 tablet (15 mg total) by mouth at bedtime.  Vulvar pruritus We discussed possible etiologies. Hx of recurrent candidiasis. We discussed some side effects of Diflucan, recommend trying topical treatment first and take Diflucan if not any improvement in 3-4 days.  -     fluconazole (DIFLUCAN) 150 MG tablet; Take 1 tablet (150 mg total) by mouth once for 1 dose. -     terconazole (TERAZOL 7) 0.4 % vaginal cream; Place 1 applicator vaginally at bedtime.  Generalized osteoarthritis of multiple sites Duloxetine did not help much, she agrees with taking for a few more weeks and if she does not feel like it is helping with back or joint pain, she  can discontinue it. Continue Tramadol. Fall precautions discussed. Wt loss will help.  Depression, major, single episode, moderate ([place]) She has been on Duloxetine for a couple weeks, explained that it may take a few more week to notice benefit. She agrees with completing 8 weeks and d/c if no benefits. Mirtazapine 15 mg 1/2 tab started today. We discussed side effects and risk of med interactions. Instructed about warning signs.  DUB (dysfunctional uterine bleeding) S/P D&C, still symptomatic. Recommend arranging f/u appt with her gyn.  I spent a total of 43 minutes in both face to face and non face to face activities for this visit on the date of this encounter. During this time history was obtained and documented, examination was performed, prior labs/imaging reviewed, and assessment/plan discussed.  Return in about 8 weeks (around 02/18/2021).   Tyus Kallam G. [name], MD  [facility]. [place] office.

## 2020-12-24 NOTE — Patient Instructions (Addendum)
A few things to remember from today's visit:  Vulvar pruritus - Plan: fluconazole (DIFLUCAN) 150 MG tablet, terconazole (TERAZOL 7) 0.4 % vaginal cream  Insomnia, unspecified type - Plan: mirtazapine (REMERON) 15 MG tablet  Depression, major, single episode, moderate ([place])  If you need refills please call your pharmacy. Do not use My Chart to request refills or for acute issues that need immediate attention.   For vaginal itching try first the cream and if not better take the pill. Arrange appt with your gynecologist.  Complete 8 weeks of Duloxetine and if not any benefit in regard to pain or mood, stop it. Today we added Mirtazapine to take at bedtime for sleep, start with 1/2 tab.  Please be sure medication list is accurate. If a new problem present, please set up appointment sooner than planned today.

## 2020-12-26 ENCOUNTER — Ambulatory Visit (HOSPITAL_BASED_OUTPATIENT_CLINIC_OR_DEPARTMENT_OTHER): Payer: Medicare Other | Admitting: Physical Therapy

## 2020-12-29 ENCOUNTER — Ambulatory Visit (HOSPITAL_COMMUNITY)
Admission: RE | Admit: 2020-12-29 | Discharge: 2020-12-29 | Disposition: A | Discharge status: Home / Self Care | Payer: Medicare Other | Source: Ambulatory Visit | Attending: Internal Medicine | Admitting: Internal Medicine

## 2020-12-29 ENCOUNTER — Other Ambulatory Visit: Payer: Self-pay

## 2020-12-29 ENCOUNTER — Telehealth: Payer: Self-pay | Admitting: Pharmacist

## 2020-12-29 DIAGNOSIS — E782 Mixed hyperlipidemia: Secondary | ICD-10-CM | POA: Diagnosis not present

## 2020-12-29 DIAGNOSIS — I480 Paroxysmal atrial fibrillation: Secondary | ICD-10-CM

## 2020-12-29 DIAGNOSIS — I5022 Chronic systolic (congestive) heart failure: Secondary | ICD-10-CM | POA: Insufficient documentation

## 2020-12-29 DIAGNOSIS — I10 Essential (primary) hypertension: Secondary | ICD-10-CM

## 2020-12-29 LAB — BASIC METABOLIC PANEL
Anion gap: 7 (ref 5–15)
BUN: 14 mg/dL (ref 8–23)
CO2: 27 mmol/L (ref 22–32)
Calcium: 9.3 mg/dL (ref 8.9–10.3)
Chloride: 107 mmol/L (ref 98–111)
Creatinine, Ser: 1.81 mg/dL — ABNORMAL HIGH (ref 0.44–1.00)
GFR, Estimated: 29 mL/min — ABNORMAL LOW (ref >60)
Glucose, Bld: 102 mg/dL — ABNORMAL HIGH (ref 70–99)
Potassium: 3.4 mmol/L — ABNORMAL LOW (ref 3.5–5.1)
Sodium: 141 mmol/L (ref 135–145)

## 2020-12-29 NOTE — Chronic Care Management (AMB) (Signed)
    Chronic Care Management Pharmacy Assistant   Name: Danielle [name]  MRN: [id number] DOB: 11/05/1945  Reason for Encounter: Follow up [employer] patient assistance. Spoke with Ronnie at [facility], they have nothing on file for this patient.  Application was mailed to patient on 10/10/2020  Unable to reach patient after multiple attempts.   Medications: Outpatient Encounter Medications as of 12/29/2020  Medication Sig   albuterol (PROVENTIL HFA;VENTOLIN HFA) 108 (90 Base) MCG/ACT inhaler Inhale 2 puffs into the lungs every 6 (six) hours as needed for wheezing or shortness of breath.   alendronate (FOSAMAX) 70 MG tablet Take 1 tablet (70 mg total) by mouth once a week. Take with a full glass of water on an empty stomach.   apixaban (ELIQUIS) 5 MG TABS tablet Take 1 tablet (5 mg total) by mouth 2 (two) times daily.   ascorbic acid (VITAMIN C) 500 MG tablet Take 500 mg by mouth daily.   budesonide-formoterol (SYMBICORT) 160-4.5 MCG/ACT inhaler Inhale 2 puffs into the lungs daily as needed.   carvedilol (COREG) 12.5 MG tablet TAKE 1 TABLET BY MOUTH  TWICE DAILY WITH A MEAL   Cholecalciferol 25 MCG (1000 UT) tablet Take 1,000 Units by mouth daily.   dapagliflozin propanediol (FARXIGA) 10 MG TABS tablet Take 1 tablet (10 mg total) by mouth daily before breakfast.   DULoxetine (CYMBALTA) 30 MG capsule Take 1 capsule (30 mg total) by mouth daily.   fluticasone (FLONASE) 50 MCG/ACT nasal spray Place 1 spray into both nostrils daily.   linaclotide (LINZESS) 145 MCG CAPS capsule Take 1 capsule (145 mcg total) by mouth daily before breakfast.   methimazole (TAPAZOLE) 5 MG tablet Take 3 tablets (15 mg total) by mouth daily.   mirtazapine (REMERON) 15 MG tablet Take 1 tablet (15 mg total) by mouth at bedtime.   pantoprazole (PROTONIX) 40 MG tablet Take 1 tablet (40 mg total) by mouth daily.   rosuvastatin (CRESTOR) 40 MG tablet TAKE 1 TABLET BY MOUTH  DAILY   sacubitril-valsartan (ENTRESTO)  49-51 MG Take 1 tablet by mouth 2 (two) times daily.   senna-docusate (SENOKOT-S) 8.6-50 MG tablet Take 2 tablets by mouth at bedtime.   spironolactone (ALDACTONE) 25 MG tablet Take 1 tablet (25 mg total) by mouth daily.   terconazole (TERAZOL 7) 0.4 % vaginal cream Place 1 applicator vaginally at bedtime.   torsemide (DEMADEX) 20 MG tablet Take 40mg  daily alternating with 20mg  every other day   traMADol (ULTRAM) 50 MG tablet TAKE 1 TABLET BY MOUTH THREE TIMES DAILY   trolamine salicylate (ASPERCREME) 10 % cream Apply 1 application topically as needed for muscle pain.   valACYclovir (VALTREX) 500 MG tablet Take on tablet by mouth twice a day for 3 days as needed for an outbreak   No facility-administered encounter medications on file as of 12/29/2020.     [place] Pharmacist Assistant [phone]

## 2020-12-30 ENCOUNTER — Ambulatory Visit (HOSPITAL_BASED_OUTPATIENT_CLINIC_OR_DEPARTMENT_OTHER): Payer: Medicare Other | Admitting: Physical Therapy

## 2020-12-30 ENCOUNTER — Ambulatory Visit (INDEPENDENT_AMBULATORY_CARE_PROVIDER_SITE_OTHER): Payer: Medicare Other | Admitting: Pharmacist

## 2020-12-30 DIAGNOSIS — I10 Essential (primary) hypertension: Secondary | ICD-10-CM

## 2020-12-30 DIAGNOSIS — I5022 Chronic systolic (congestive) heart failure: Secondary | ICD-10-CM

## 2020-12-30 NOTE — Progress Notes (Signed)
Chronic Care Management Pharmacy Note  12/31/2020 Name:  Danielle [name] MRN:  [id number] DOB:  [birth year]  Summary: BP at goal < 140/90 per home readings Pt is having some trouble affording some of her medications  Recommendations/Changes made from today's visit: -Applied for Farxiga PAP -Recommended continued BP monitoring at home and daily weights  Plan: Mail out PAP for Linzess   Subjective: Danielle [name] is an 75 y.o. year old female who is a primary patient of [name], [name], MD.  The CCM team was consulted for assistance with disease management and care coordination needs.    Engaged with patient by telephone for follow up visit in response to provider referral for pharmacy case management and/or care coordination services.   Consent to Services:  The patient was given information about Chronic Care Management services, agreed to services, and gave verbal consent prior to initiation of services.  Please see initial visit note for detailed documentation.   Patient Care Team: [name], Danielle G, MD as PCP - General (Family Medicine) End, Harrell Gave, MD as PCP - Cardiology (Cardiology) [name], MD as Consulting Physician (Hematology) Kyung Rudd, MD as Consulting Physician (Radiation Oncology) Rolm Bookbinder, MD as Consulting Physician (General Surgery) [name], Charlestine Massed, NP as Nurse Practitioner (Hematology and Oncology) Dimitri Ped, RN as Case Manager Viona Gilmore, [place] as Pharmacist (Pharmacist) Alla Feeling, NP as Nurse Practitioner (Nurse Practitioner)  Recent office visits: 12/24/20 Danielle [name], MD: Patient presented for ED visit follow up.  Prescribed mirtazapine 1/2 tablet daily for sleep and depression. Plan to stay on duloxetine for full 8 weeks and then stop if no benefit.  12/08/20 Danielle [name], MD: Patient presented for GI symptoms and chronic conditions follow up. Prescribed Linzess. Stopped trazodone due  to lack of efficacy. Prescribed duloxetine for pain and depression.  12/02/20 [name] RN (Family Medicine) -  Seen for CCM services related to chronic pain disorder and other chronic conditions. No medication changes. Follow up already scheduled in chart.  11/19/20 Danielle [name], MD: Patient presented for video visit for insomnia follow up. Prescribed trazodone 1/2 - 1 tablet at bedtime PRN.  08/25/20 [name], RN: Patient presented for anti-coag visit. INR 3.9, goal 2-3. Recommended holding dose x 1. Decreased to 3 mg daily except 1.5 mg on Mon, Wed and Fri. Recheck in 3-4 weeks.  07/07/20 [name] LCSW (Family Medicine) - Seen for CCM services for grief counseling for COPD and other chronic conditions. No medication changes.   07/02/20 [name] LCSW (Family Medicine) - Seen for CCM services for mental health counseling and other chronic conditions. no medication changes.   07/02/20 Danielle [name] MD ( PCP) - seen for postmenopausal vaginal bleeding and other chronic conditions. Discontinued atorvastatin 72m. Referral placed to gynecology. Follow up in 6 months.    Recent consult visits: 12/19/20 [name] PT (rehab): Patient presented for PT session.  12/17/20 [name] (cardiology): Patient presented for CHF visit. Increased spironolactone to 25 mg daily and increased torsemide to 40 mg daily alternating with 20 mg every other day. Stopped warfarin and started Eliquis 5 mg BID.  11/27/20 [name] (physical medicine & rehab): Patient presented for spondylosis follow up. Referral placed for PT.  11/26/20 [name]([facility]: Patient presented for post op exam.  11/21/20 [name] (oncology): Patient presented for breast cancer follow up.   11/12/20 [name] (cardiology): Patient presented for cardiac clearance for procedure.  11/10/20 [name](OBGYN):  Patient presented for post menopausal bleeding.  10/22/20 Brook  Gala Romney [facility]): Patient presented for cervical stenosis.  10/17/20 [name], MD (cardiology): Patient presented for CHF visit. Prescribed spironolactone 12.5 mg daily.  10/01/20 [name], MD (cardiology): Patient presented for CHF visit. Changed torsemide to 10 mg daily.  09/05/20 [name], MD (cardiology): Patient presented for CHF visit. D/c'd furosemide and switched to torsemide. Prescribed Farxiga 10 mg daily. D/c'd aspirin.   09/02/20 [name], PT (rehab): Patient presented for PT session.  08/20/20 [name], MD (OBGYN): Patient presented for initial visit for postmenopausal bleeding. Completed PAP smear.  08/13/20 [name], RD (diabetes and nutrition): Patient presented for CKD nutrition counseling.  07/31/20 [name], MD (neurology): Patient presented for initial visit for gait abnormality. Prescribed duloxetine 60 mg daily. Placed order for PT.  07/24/20 [name], MD (oncology): Patient presented for breast cancer follow up.   07/23/20 [name], MD (cardiology): Patient presented for heart failure follow up. Referred to HF clinic. Arranged for PFTs and bronchodilator challenge.   Hospital visits: 12/20/20 Patient presented to [place] ED for knee pain. Placed in knee immobilizer and gave her Percocet x 1 dose.  11/17/20 Patient admitted to [facility] for hysterectomy.   Objective:  Lab Results  Component Value Date   CREATININE 1.81 (H) 12/29/2020   BUN 14 12/29/2020   GFR 57.35 (L) 01/04/2018   GFRNONAA 29 (L) 12/29/2020   GFRAA 57 (L) 02/27/2020   NA 141 12/29/2020   K 3.4 (L) 12/29/2020   CALCIUM 9.3 12/29/2020   CO2 27 12/29/2020   GLUCOSE 102 (H) 12/29/2020    Lab Results  Component Value Date/Time   HGBA1C 5.1 12/08/2020 09:27 AM   HGBA1C 5.3 07/31/2020 10:48 AM   HGBA1C 5.0 08/28/2018 05:25 PM   GFR 57.35 (L) 01/04/2018 12:32 PM   GFR 58.52 (L) 09/28/2017 08:06 AM   MICROALBUR 1.0  07/07/2020 09:41 AM   MICROALBUR 1.9 01/04/2018 12:32 PM    Last diabetic Eye exam: No results found for: HMDIABEYEEXA  Last diabetic Foot exam: No results found for: HMDIABFOOTEX   Lab Results  Component Value Date   CHOL 162 07/02/2020   HDL 73.70 07/02/2020   LDLCALC 71 07/02/2020   TRIG 90.0 07/02/2020   CHOLHDL 2 07/02/2020    Hepatic Function Latest Ref Rng & Units 11/21/2020 10/29/2020 07/24/2020  Total Protein 6.5 - 8.1 g/dL 6.6 7.3 6.7  Albumin 3.5 - 5.0 g/dL 3.4(L) 3.8 3.3(L)  AST 15 - 41 U/L 11(L) 13(L) 9(L)  ALT 0 - 44 U/L 14 11 <6  Alk Phosphatase 38 - 126 U/L 46 50 47  Total Bilirubin 0.3 - 1.2 mg/dL 0.4 0.4 0.3    Lab Results  Component Value Date/Time   TSH 1.146 12/17/2020 10:40 AM   TSH 0.53 12/08/2020 09:26 AM   TSH 0.57 07/02/2020 10:29 AM   FREET4 0.91 01/29/2019 12:32 PM   FREET4 0.80 06/14/2017 03:24 PM    CBC Latest Ref Rng & Units 11/21/2020 11/17/2020 11/17/2020  WBC 4.0 - 10.5 K/uL 6.3 - 5.4  Hemoglobin 12.0 - 15.0 g/dL 10.9(L) 12.9 12.1  Hematocrit 36.0 - 46.0 % 33.6(L) 38.0 37.9  Platelets 150 - 400 K/uL 70(L) - 84(L)    Lab Results  Component Value Date/Time   VD25OH 39.35 07/02/2020 10:29 AM   VD25OH 39.12 01/29/2019 12:32 PM    Clinical ASCVD: Yes  The ASCVD Risk score (Arnett DK, et al., 2019) failed  to calculate for the following reasons:   The patient has a prior MI or stroke diagnosis    Depression screen [facility] 2/9 12/08/2020 11/27/2020 09/19/2020  Decreased Interest 3 0 0  Down, Depressed, Hopeless 3 0 0  PHQ - 2 Score 6 0 0  Altered sleeping 3 - -  Tired, decreased energy 3 - -  Change in appetite 3 - -  Feeling bad or failure about yourself  3 - -  Trouble concentrating 2 - -  Moving slowly or fidgety/restless 3 - -  Suicidal thoughts 0 - -  PHQ-9 Score 23 - -  Difficult doing work/chores Somewhat difficult - -  Some recent data might be hidden     CHA2DS2/VAS Stroke Risk Points  Current as of 9 minutes ago     7 >= 2  Points: High Risk  1 - 1.99 Points: Medium Risk  0 Points: Low Risk    Last Change: N/A      Details    This score determines the patient's risk of having a stroke if the  patient has atrial fibrillation.       Points Metrics  1 Has Congestive Heart Failure:  Yes    Current as of 9 minutes ago  1 Has Vascular Disease:  Yes    Current as of 9 minutes ago  1 Has Hypertension:  Yes    Current as of 9 minutes ago  1 Age:  7    Current as of 9 minutes ago  0 Has Diabetes:  No    Current as of 9 minutes ago  2 Had Stroke:  Yes  Had TIA:  No  Had Thromboembolism:  No    Current as of 9 minutes ago  1 Female:  Yes    Current as of 9 minutes ago      Social History   Tobacco Use  Smoking Status Former   Packs/day: 0.25   Years: 10.00   Pack years: 2.50   Types: Cigarettes   Quit date: 2012   Years since quitting: 10.8  Smokeless Tobacco Never   BP Readings from Last 3 Encounters:  12/24/20 130/80  12/20/20 122/85  12/17/20 110/70   Pulse Readings from Last 3 Encounters:  12/24/20 69  12/20/20 80  12/17/20 75   Wt Readings from Last 3 Encounters:  12/17/20 262 lb 6.4 oz (119 kg)  11/27/20 247 lb (112 kg)  11/21/20 251 lb (113.9 kg)   BMI Readings from Last 3 Encounters:  12/24/20 41.10 kg/m  12/17/20 41.10 kg/m  12/08/20 38.69 kg/m    Assessment/Interventions: Review of patient past medical history, allergies, medications, health status, including review of consultants reports, laboratory and other test data, was performed as part of comprehensive evaluation and provision of chronic care management services.   SDOH:  (Social Determinants of Health) assessments and interventions performed: No  SDOH Screenings   Alcohol Screen: Low Risk    Last Alcohol Screening Score (AUDIT): 0  Depression (PHQ2-9): Medium Risk   PHQ-2 Score: 23  Financial Resource Strain: Medium Risk   Difficulty of Paying Living Expenses: Somewhat hard  Food Insecurity: No Food  Insecurity   Worried About Charity fundraiser in the Last Year: Never true   Ran Out of Food in the Last Year: Never true  Housing: Low Risk    Last Housing Risk Score: 0  Physical Activity: Sufficiently Active   Days of Exercise per Week: 7 days   Minutes of  Exercise per Session: 30 min  Social Connections: Moderately Integrated   Frequency of Communication with Friends and Family: More than three times a week   Frequency of Social Gatherings with Friends and Family: More than three times a week   Attends Religious Services: More than 4 times per year   Active Member of [employer] or Organizations: No   Attends Archivist Meetings: Never   Marital Status: Married  Stress: No Stress Concern Present   Feeling of Stress : Only a little  Tobacco Use: Medium Risk   Smoking Tobacco Use: Former   Smokeless Tobacco Use: Never   Passive Exposure: Not on file  Transportation Needs: No Transportation Needs   Lack of Transportation (Medical): No   Lack of Transportation (Non-Medical): No    CCM Care Plan  Allergies  Allergen Reactions   Lisinopril Cough   Tetanus Toxoid Adsorbed Swelling    Arm swelling   Other Cough    PEANUTS    Medications Reviewed Today     Reviewed by [name], Danielle G, MD (Physician) on 12/24/20 at 1100  Med List Status: <None>   Medication Order Taking? Sig Documenting Provider Last Dose Status Informant  albuterol (PROVENTIL HFA;VENTOLIN HFA) 108 (90 Base) MCG/ACT inhaler [id number] Yes Inhale 2 puffs into the lungs every 6 (six) hours as needed for wheezing or shortness of breath. [provider] Taking Active Self  alendronate (FOSAMAX) 70 MG tablet [id number] Yes Take 1 tablet (70 mg total) by mouth once a week. Take with a full glass of water on an empty stomach. [name], Danielle G, MD Taking Active            Med Note (Paoli, Lahoma Rocker   Wed Jul 23, 2020  9:08 AM)    apixaban (ELIQUIS) 5 MG TABS tablet [id number] Yes Take 1 tablet (5  mg total) by mouth 2 (two) times daily. Larey Dresser, MD Taking Active   ascorbic acid (VITAMIN C) 500 MG tablet [id number] Yes Take 500 mg by mouth daily. [provider] Taking Active   budesonide-formoterol (SYMBICORT) 160-4.5 MCG/ACT inhaler [id number] Yes Inhale 2 puffs into the lungs daily as needed. [name], Danielle G, MD Taking Active   carvedilol (COREG) 12.5 MG tablet [id number] Yes TAKE 1 TABLET BY MOUTH  TWICE DAILY WITH A MEAL End, Christopher, MD Taking Active   Cholecalciferol 25 MCG (1000 UT) tablet [id number] Yes Take 1,000 Units by mouth daily. [provider] Taking Active Self  dapagliflozin propanediol (FARXIGA) 10 MG TABS tablet [id number] Yes Take 1 tablet (10 mg total) by mouth daily before breakfast. Larey Dresser, MD Taking Active Self  DULoxetine (CYMBALTA) 30 MG capsule [id number] Yes Take 1 capsule (30 mg total) by mouth daily. [name], Danielle G, MD Taking Active   fluconazole (DIFLUCAN) 150 MG tablet [id number] Yes Take 1 tablet (150 mg total) by mouth once for 1 dose. [name], Danielle G, MD  Active   fluticasone [facility]) 50 MCG/ACT nasal spray [id number] Yes Place 1 spray into both nostrils daily. [name], Danielle G, MD Taking Active   linaclotide [facility]) 145 MCG CAPS capsule [id number] Yes Take 1 capsule (145 mcg total) by mouth daily before breakfast. [name], Danielle G, MD Taking Active   methimazole (TAPAZOLE) 5 MG tablet [id number] Yes Take 3 tablets (15 mg total) by mouth daily. [name], Danielle G, MD Taking Active Self  mirtazapine (REMERON) 15 MG tablet [id number] Yes Take 1 tablet (15 mg total) by mouth at bedtime. [name], Danielle G,  MD  Active   pantoprazole (PROTONIX) 40 MG tablet [id number] Yes Take 1 tablet (40 mg total) by mouth daily. Larey Dresser, MD Taking Active Self  rosuvastatin (CRESTOR) 40 MG tablet [id number] Yes TAKE 1 TABLET BY MOUTH  DAILY [name], Danielle G, MD Taking Active Self  sacubitril-valsartan (ENTRESTO) 49-51 MG [id number] Yes Take 1 tablet  by mouth 2 (two) times daily. [provider] Taking Active Self  senna-docusate (SENOKOT-S) 8.6-50 MG tablet [id number] Yes Take 2 tablets by mouth at bedtime. [provider] Taking Active Self  spironolactone (ALDACTONE) 25 MG tablet [id number] Yes Take 1 tablet (25 mg total) by mouth daily. Larey Dresser, MD Taking Active   terconazole (TERAZOL 7) 0.4 % vaginal cream [id number] Yes Place 1 applicator vaginally at bedtime. [name], Danielle G, MD  Active   torsemide [facility]) 20 MG tablet [id number] Yes Take 95m daily alternating with 21mevery other day McLarey DresserMD Taking Active   traMADol (ULTRAM) 50 MG tablet [id number] TAKE 1 TABLET BY MOUTH THREE TIMES DAILY Kirsteins, AnLuanna SalkMD Taking Active   trolamine salicylate (ASPERCREME) 10 % cream [id number] Apply 1 application topically as needed for muscle pain. [provider] Taking Active Self  valACYclovir (VALTREX) 500 MG tablet [id number] Take on tablet by mouth twice a day for 3 days as needed for an outbreak AmNunzio CobbsMD Taking Active Self            Patient Active Problem List   Diagnosis Date Noted   Depression, major, single episode, moderate (HCGlencoe10/11/2020   Gait abnormality 07/31/2020   Paresthesia 07/31/2020   Low back pain with bilateral sciatica 07/31/2020   Pneumonia due to COVID-19 virus    Bacteremia due to Gram-positive bacteria 08/29/2018   Abscess    Cellulitis of left breast 08/28/2018   Cellulitis of left abdominal wall 08/28/2018   Encounter for routine gynecological examination 04/28/2018   Stage 3 chronic kidney disease (HCBerlin Heights07/30/2019   Recurrent genital herpes 04/13/2017   Chronic pain disorder 01/25/2017   Malignant neoplasm of upper-outer quadrant of left breast in female, estrogen receptor positive (HCOasis10/18/2018   Chronic knee pain 12/07/2016   Chronic bilateral low back pain without sciatica 12/07/2016   Peripheral musculoskeletal  gait disorder 12/07/2016   Encounter for therapeutic drug monitoring 06/16/2016   Generalized osteoarthritis of multiple sites 05/04/2016   Aortic valve disease 04/10/2016   S/P AVR 04/10/2016   NICM (nonischemic cardiomyopathy) (HCAndale02/11/2016   Upper airway cough syndrome 03/14/2016   Morbid obesity (HCClara City01/14/2018   COPD (chronic obstructive pulmonary disease) (HCNew Richmond01/12/2016   Thrombocytopenia (HCSteele10/13/2017   Anemia 12/12/2015   Vitamin D deficiency 12/12/2015   Acute urinary retention 12/11/2015   Osteoporosis 12/11/2015   Hip fracture (HCHarvel10/12/2015   Aortic atherosclerosis (HCKensington10/12/2015   Lung blebs (HCSeama10/05/2015   Pelvic fracture (HCGibbon06/20/2017   Fall at home 08/19/2015   Essential hypertension 08/19/2015   Mixed hyperlipidemia 08/19/2015   Asthma 08/19/2015   Paroxysmal atrial fibrillation (HCArmstrong06/20/2017   Hyperthyroidism 08/19/2015   Chronic HFrEF (heart failure with reduced ejection fraction) (HCDarke   Coronary artery disease    Aortic stenosis     Immunization History  Administered Date(s) Administered   Fluad Quad(high Dose 65+) 11/20/2018, 12/08/2020   Influenza, High Dose Seasonal PF 11/24/2016, 12/08/2017   Influenza-Unspecified 12/07/2015, 11/09/2019   Moderna SARS-COV2 Booster Vaccination 02/28/2020   Moderna Sars-Covid-2 Vaccination 03/24/2019,  04/28/2019, 07/02/2020   Pneumococcal Conjugate-13 11/13/2013, 11/21/2014, 12/15/2018   Pneumococcal Polysaccharide-23 12/15/2012, 05/20/2016   Zoster Recombinat (Shingrix) 12/21/2017, 12/15/2018   Zoster, Live 03/01/2014   Patient slid down in her shower and was having a lot of swelling and could not put pressure on her right knee because of swelling.   Patient can't go swimming because she cannot put pressure on her knee.   Conditions to be addressed/monitored:  Hypertension, Hyperlipidemia, Atrial Fibrillation, Heart Failure, COPD, Asthma, Chronic Kidney Disease, Osteoporosis and Breast  cancer, chronic pain, insomnia, and hyperthyroidism  Conditions addressed this encounter: Heart failure, COPD, insomnia  Care Plan : CCM Pharmacy Care Plan  Updates made by Viona Gilmore, [place] since 12/31/2020 12:00 AM     Problem: Problem: Hypertension, Hyperlipidemia, Atrial Fibrillation, Heart Failure, COPD, Asthma, Chronic Kidney Disease, Osteoporosis and Breast cancer, chronic pain, insomnia, and hyperthyroidism      Long-Range Goal: Patient-Specific Goal   Start Date: 06/11/2020  Expected End Date: 06/11/2021  Recent Progress: On track  Priority: High  Note:   Current Barriers:  Unable to independently monitor therapeutic efficacy Unable to achieve control of cholesterol  Suboptimal therapeutic regimen for COPD/asthma  Pharmacist Clinical Goal(s):  Patient will verbalize ability to afford treatment regimen achieve adherence to monitoring guidelines and medication adherence to achieve therapeutic efficacy achieve control of cholesterol as evidenced by next lipid panel  through collaboration with PharmD and provider.   Interventions: 1:1 collaboration with [name], Danielle G, MD regarding development and update of comprehensive plan of care as evidenced by provider attestation and co-signature Inter-disciplinary care team collaboration (see longitudinal plan of care) Comprehensive medication review performed; medication list updated in electronic medical record  Hypertension (BP goal <140/90) -Controlled -Current treatment: Carvedilol 12.5 mg, 1 tablet twice daily Entresto 49-51 mg 1 tablet twice daily Spironolactone 25 mg 1 tablet daily -Medications previously tried: none  -Current home readings: 120/70, nothing > 140 -Current dietary habits: limits salt intake -Current exercise habits: limited in ability to exercise -Denies hypotensive/hypertensive symptoms -Educated on Importance of home blood pressure monitoring; Proper BP monitoring technique; Symptoms of  hypotension and importance of maintaining adequate hydration; -Counseled to monitor BP at home weekly, document, and provide log at future appointments -Counseled on diet and exercise extensively Recommended to continue current medication  Hyperlipidemia/history of stroke: (LDL goal < 70) -Not ideally controlled -Current treatment: Rosuvastatin 5m, 1 tablet once daily  Aspirin 836m 1 tablet once daily  -Medications previously tried: none  -Current dietary patterns: sometimes fries food, no processed meats, no read meat and uses canolia oil -Current exercise habits: limited in ability -Educated on Cholesterol goals;  Benefits of statin for ASCVD risk reduction; Importance of limiting foods high in cholesterol; Exercise goal of 150 minutes per week; -Counseled on diet and exercise extensively Recommended to continue current medication Recommended repeat lipid panel and if LDL > 70, consider additional lipid lowering therapy  Heart Failure (Goal: manage symptoms and prevent exacerbations) -Uncontrolled -Last ejection fraction: 45-50% (Date: 11/21/19) -HF type: Systolic -NYHA Class: III (marked limitation of activity) -AHA HF Stage: C (Heart disease and symptoms present) -Current treatment: Entresto 49-51 mg 1 tablet twice daily Torsemide 20 mg 1 tablet daily Carvedilol 2536m1 tablet twice daily Farxiga 10 mg, 1 tablet daily -Medications previously tried: losartan  -Current home BP/HR readings: never > 140 -Current dietary habits: limits salt intake; drinks only 2 bottles of water and a cup of coffee daily -Current exercise habits: limited in ability; was doing  [facility] -Educated on Importance of weighing daily; if you gain more than 3 pounds in one day or 5 pounds in one week, call cardiologist Proper diuretic administration and potassium supplementation Importance of blood pressure control -Recommended to continue  current medication Assessed patient finances. Patient is receiving Entresto through patient assistance.  Atrial Fibrillation (Goal: prevent stroke and major bleeding) -Controlled -CHADSVASC: 7 -Current treatment: Rate control: carvedilol 25 mg 1 tablet twice daily Anticoagulation: Eliquis 5 mg 1 tablet twice daily -Medications previously tried: none -Home BP and HR readings: nothing > 140  -Counseled on increased risk of stroke due to Afib and benefits of anticoagulation for stroke prevention; importance of adherence to anticoagulant exactly as prescribed; bleeding risk associated with Eliquis and importance of self-monitoring for signs/symptoms of bleeding; avoidance of NSAIDs due to increased bleeding risk with anticoagulants; -Recommended to continue current medication Educated on importance of taking Eliquis doses approximately 12 hours apart.  COPD/asthma (Goal: control symptoms and prevent exacerbations) -Uncontrolled -Current treatment  Albuterol HFA inhaler, 2 puffs every six hours as needed for whezing or shortness of breath  Symbicort 160-4.55 mcg/act inhale 2 puffs twice daily - does not use or have -Medications previously tried: Dentist (switched to Symbicort) -Gold Grade: Gold 1 (FEV1>80%) -Current COPD Classification:  B (high sx, <2 exacerbations/yr) -MMRC/CAT score: n/a -Pulmonary function testing: n/a -Exacerbations requiring treatment in last 6 months: none -Patient denies consistent use of maintenance inhaler (only using once daily) -Frequency of rescue inhaler use: regularly -Counseled on Proper inhaler technique; Benefits of consistent maintenance inhaler use When to use rescue inhaler Differences between maintenance and rescue inhalers -Recommended to continue current medication Assessed patient finances. Qualified for Symbicort patient assistance.   Osteoporosis (Goal prevent fractures) -Controlled -Last DEXA Scan: 11/29/2018   T-Score femoral neck:  -2.8  T-Score total hip: n/a  T-Score lumbar spine: n/a  T-Score forearm radius: -1.7  10-year probability of major osteoporotic fracture: n/a  10-year probability of hip fracture: n/a -Patient is a candidate for pharmacologic treatment due to history of hip fracture  and T-Score < -2.5 in femoral neck -Current treatment  Alendronate 38m, 1 tablet once a week. (Saturday) - started 2017  Vitamin D 1000 units, 1 tablet once daily -Medications previously tried: none  -Recommend [phone] units of vitamin D daily. Recommend 1200 mg of calcium daily from dietary and supplemental sources. Counseled on oral bisphosphonate administration: take in the morning, 30 minutes prior to food with 6-8 oz of water. Do not lie down for at least 30 minutes after taking. Recommend weight-bearing and muscle strengthening exercises for building and maintaining bone density. -Recommended to continue current medication Educated on drug holiday after repeat bone density scan  Chronic pain (Goal: minimize symptoms) -Controlled -Current treatment  Diclofenac 1%gel, apply 4g four times daily  Aspercreme- as needed  APAP 5071m 2 tablet daily as needed for pain  Tramadol 5065m1 tablet three times daily  -Medications previously tried: none  -Recommended to continue current medication  Hyperthyroidism (Goal: TSH 0.35-4.5) -Controlled -Current treatment  Methimazole 5mg22m tablets every evening -Medications previously tried: none  -Recommended to continue current medication  Breast cancer (Goal: remission) -Controlled -Current treatment  Tamoxifen 20mg70mtablet once daily (started 2019) -Medications previously tried: none  -Recommended to continue current medication  Insomnia (Goal: improve quality and quantity of sleep) -Not ideally controlled -Current treatment  Mirtazapine 15 mg 1/2 tablet at bedtime -Medications previously  tried: diphenydramine, trazodone -Counseled on practicing good sleep hygiene  by setting a sleep schedule and maintaining it, avoid excessive napping, following a nightly routine, avoiding screen time for 30-60 minutes before going to bed, and making the bedroom a cool, quiet and dark space  GERD (Goal: minimize symptoms) -Controlled -Current treatment  Pantoprazole 40 mg 1 tablet daily as needed -Medications previously tried: none  -Recommended to continue current medication   Health Maintenance -Vaccine gaps: none -Current therapy:  Senokot-S 8.6-50 mg 2 tablets at bedtime Valacyclovir 500 mg 1 tablet twice daily as needed for outbreaks -Educated on Cost vs benefit of each product must be carefully weighed by individual consumer -Patient is satisfied with current therapy and denies issues -Recommended to continue current medication  Patient Goals/Self-Care Activities Patient will:  - take medications as prescribed check blood pressure weekly, document, and provide at future appointments weigh daily, and contact provider if weight gain of > 3 lbs in one day or > 5 lbs in one week engage in dietary modifications by limiting fried foods  Follow Up Plan: Telephone follow up appointment with care management team member scheduled for: 6 months       Medication Assistance: None required.  Patient affirms current coverage meets needs.  Compliance/Adherence/Medication fill history: Care Gaps: Colonoscopy, COVID booster  Star-Rating Drugs: Rosuvastatin 84m - last filled on 10/30/20 90DS at [facility]  Patient's preferred pharmacy is:  [facility] [place] - [address] [place] [address]Phone: [phone]: [phone] [facility]  (OLarwill CPalestineLTrios [facility] [place]enton[address]Phone: [phone]: [phone] [facility] ([facility]) - [place]ipt[place]Phone: [phone]: 8[address] [place]Phone: [phone]: 3[place]SM[place]Phone: [phone]: [phone] Uses pill box? Yes - patient uses a weekly container (AM/PM) Pt endorses 95% compliance  We discussed: Current pharmacy is preferred with insurance plan and patient is satisfied with pharmacy services Patient decided to: Continue current medication management strategy  Care Plan and [name] Decision:  Patient agrees to Care Plan and Follow-up.  Plan: Telephone follow up appointment with care management team member scheduled for:  4 months  MJeni Salles PharmD BBirdsboroPharmacist LPrairie Viewat BElm [phone]

## 2020-12-31 NOTE — Patient Instructions (Signed)
Hi Danielle [name],  It was great to get to catch up with you again! Don't forget to try to space out your Eliquis by as close to 12 hours as you can.   Please reach out to me if you have any questions or need anything before our follow up!  Best, Maddie  Danielle [name], PharmD, [place] [facility]   Visit Information   Goals Addressed   None    Patient Care Plan: RNCM: Cardiovascular Disease Management( Heart Failure, HTN, paroxysmal atrial fibrillation)     Problem Identified: Lack of long term management of Cardiovascular Disease( Heart Failure, HTN, paroxysmal atrial fibrillation)   Priority: Medium     Long-Range Goal: Long term self mangement of Cardiovascular Disease Management( Heart Failure, HTN, paroxysmal atrial fibrillation)   Start Date: 06/24/2020  Expected End Date: 04/29/2021  This Visit's Progress: On track  Recent Progress: On track  Priority: Medium  Note:   Current Barriers:  Knowledge deficits related to basic heart failure pathophysiology and self care management of Cardiovascular Disease Management( Heart Failure, HTN, paroxysmal atrial fibrillation)  Does not adhere to provider recommendations re: low sodium diet at times  States that the torsemide is working better to help her keep her fluid off and she is taking the [place] as ordered.  Pt reports weighting daily and her weight was 240 today denies any chest pains, reports her swelling in legs has improved.  Reports she wears compression hose but not always, reports shortness of breath with exertion,  denies any recent atrial fibrillation, reports she has her blood checked regularly for her Coumadin, States she is checking her B/P 4-5 times a week and it was 112/74 when she last checked it. States she tries to follow a low sodium diet and the fluid restriction that the heart failure clinic gave her.  States she had surgery for  vaginal bleeding and Dr. [name]  took her off of the tamoxifen.  States she had an MRI of her heart yesterday   Nurse Case Manager Clinical Goal(s):  patient will weigh self daily and record patient will verbalize understanding of Heart Failure Action Plan and when to call doctor patient will take all Heart Failure mediations as prescribed Interventions:  Collaboration with [name], [name], MD regarding development and update of comprehensive plan of care as evidenced by provider attestation and co-signature Inter-disciplinary care team collaboration (see longitudinal plan of care) Reveiwed basic overview and discussion of pathophysiology of Heart Failure Reinforced to follow a  low sodium diet and fluid restrictions Reinforced Heart Failure Action Plan  Reinforced importance of daily weight Reinforced role of diuretics in prevention of fluid overload Reviewed s/sx of bleeding to call provider and reviewed foods to avoid while on Coumadin Reviewed upcoming provider appointments Dr. [name] 12/08/20, Heart failure clinic 12/17/20, Dr. Saunders Revel cardiology 05/20/21, CCM PharmD 01/02/21 Self-Care Activities:  Takes Heart Failure Medications as prescribed Weighs daily and record (notifying MD of 3 lb weight gain over night or 5 lb in a week)  Patient Goals:  - Take Heart Failure Medications as prescribed - Weigh daily and record (notify MD with 3 lb weight gain over night or 5 lb in a week) - Follow CHF Action Plan - Adhere to low sodium diet - begin a heart failure diary - bring diary to all appointments - develop a rescue plan - eat more whole grains, fruits and vegetables, lean meats and healthy fats - follow rescue plan if symptoms flare-up -  know when to call the doctor - follow activity or exercise plan - make an activity or exercise plan - meet with physical therapist - pace activity allowing for rest - do ankle pumps when sitting - keep legs up while sitting - track weight in diary - use salt in moderation - watch  for swelling in feet, ankles and legs every day -Calls provider office for new concerns, questions, or BP outside discussed parameters -Follows a low sodium diet/DASH diet -check blood pressure 3 times per week - write blood pressure and weight results in a log or diary  -Plan to fill your medication box weekly  Follow Up Plan: Telephone follow up appointment with care management team member scheduled for:  01/15/21 at 3:30 PM The patient has been provided with contact information for the care management team and has been advised to call with any health related questions or concerns.      Patient Care Plan: LCSW Plan of Care     Problem Identified: Reduce and Manage Symptoms of Depression, Grief and Loss. Resolved 07/07/2020  Priority: High     Goal: Reduce and Manage Symptoms of Depression, Grief and Loss. Completed 07/07/2020  Start Date: 05/26/2020  Expected End Date: 07/07/2020  This Visit's Progress: On track  Recent Progress: On track  Priority: High  Note:   Current Barriers:  Unable to independently manage symptoms of Depression and Grief. Knowledge deficits related to accessing mental health providers in patient with Depression and Grief. Patient is experiencing symptoms of Depression and Grief, which seem to be exacerbated by the recent death of her sister.     Patient needs Support, Education, and Care Coordination in order to meet unmet mental health needs. Patient is unable to independently navigate community resource options without care coordination support. Deceased sisters memorial service and birthday celebration are scheduled for Friday, May 6th, which may trigger depression and grief. Clinical Social Work Goal(s):  Over the next 60 days, patient will work with [employer] weekly, by telephone, to reduce and manage symptoms of Depression and Grief, until connected for ongoing counseling resources within the community.  Patient will implement clinical interventions discussed  today to decrease symptoms of Depression and Grief, and increase knowledge and/or ability of: coping skills, healthy habits, self-management skills, and stress reduction. Over the next 60 days, patient will demonstrate improved adherence to prescribed treatment plan for Depression and Grief, as evidenced by a reduction in symptoms.  Interventions:  Collaboration with [name], [name], MD regarding development and update of comprehensive plan of care as evidenced by provider attestation and co-signature. Assessed patient's understanding, education, previous treatment and care coordination needs.  Patient interviewed and appropriate assessments performed: Review of PHQ 2 and PHQ 9 Depression Screening scores. Provided basic mental health support, education and interventions.  Collaborated with appropriate clinical care team members regarding patient needs, requesting Nutrition/Dietary Consult from [name], [name], MD. Discussed several options for long term counseling based on need and insurance.  Assisted patient with narrowing down options for long-term counseling services and provided list of community providers in network with [employer].   Reviewed mental health medications with patient prescribed by PCP and discussed compliance.  Other interventions include: Motivational Interviewing, Solution-Focused Strategies, Mindfulness Meditation Strategies, Relaxation Techniques, and Deep Breathing Exercises, Brief Cognitive Behavioral Therapy, Grief Counseling, Emotional/Supportive Counseling, Psychotropic Medication Adherence Assessment, Sleep Hygiene Assessment, Psychoeducation, Health Education, Problem Solving and Teaching/Coaching Strategies.  Mailed patient mental health counseling resources, list of community providers, coping strategies/techniques  and list of support groups. Self-Care Activities: Self administers medications as prescribed; Attends all scheduled provider appointments;  Calls pharmacy for medication refills; Attends church and other social activities; Lives independently with husband and performs ADL's/IADL's independently; Calls provider office for new questions and/or concerns; Motivated for treatment; Strong family and social support. Patient Goals: Continue to implement clinical interventions to help reduce and manage symptoms (I.e. Mindfulness Meditation Strategies, Relaxation Techniques, and Deep Breathing Exercises) on a daily basis. Try to exercise at least 2 to 3 times per week, or as tolerated, eat a healthy, well-balanced diet, drink plenty of fluids and obtain adequate rest. Continue spending time talking or visiting with friends, family members, neighbors, congregation members, etc., to gain support and socialization.   Follow Up Plan: No Follow-Up Required.    Patient Care Plan: RNCM:Chronic Pain (Adult)     Problem Identified: Chronic Pain Management (Chronic Pain)   Priority: Medium     Long-Range Goal: Chronic Pain Managed   Start Date: 06/24/2020  Expected End Date: 04/29/2021  This Visit's Progress: On track  Recent Progress: On track  Priority: Medium  Note:   Current Barriers:  Knowledge Deficits related to self-health management of acute or chronic pain of lower back due to degenerative disc disease  Chronic Disease Management support and education needs related to chronic pain Chronic Disease Management support and education needs related to chronic back pain Does not adhere to provider recommendations re: exercise Unable to perform IADLs independently uses walker  Pt states she is to have injections in her knees to help with the pain.  States she is to go Monday to be evaluated for water PT for her knees.  States she would like to get strong enough to only need a cane and be able to help her husband around the house more. states she is taking her Tramadol as ordered.  Denies any falls.  States she uses her walker in her home but uses  a wheelchair when she goes to appointments Clinical Goal(s):  patient will verbalize understanding of plan for pain management. , patient will attend all scheduled medical appointments:  Dr. Read Drivers 01/27/21, PT at [facility] 12/08/20, patient will demonstrate use of different relaxation  skills and/or diversional activities to assist with pain reduction (distraction, imagery, relaxation, massage, acupressure, TENS, heat, and cold application., patient will report pain at a level less than 3 to 4 on a 10-10 rating scale., patient will use pharmacological and nonpharmacological pain relief strategies as prescribed. , patient will verbalize acceptable level of pain relief and ability to engage in desired activities, and patient will engage in desired activities without an increase in pain level Interventions:  Collaboration with [name], [name], MD regarding development and update of comprehensive plan of care as evidenced by provider attestation and co-signature Pain assessment performed Medications reviewed Reinforced on methods of chronic pain control  Discussed plans with patient for ongoing care management follow up and provided patient with direct contact information for care management team Reviewed scheduled/upcoming provider appointments including:  Dr. Read Drivers 01/27/21, PT at [facility] 12/08/20 Reinforced benefits of exercise and therapy to help with chronic pain and mobility  Patient Goals/Self Care Activities:  Will self-administer medications as prescribed Will attend all scheduled provider appointments Will call pharmacy for medication refills 7 days prior to needed refill date Patient will calls provider office for new concerns or questions  learn how to meditate learn relaxation techniques practice acceptance of chronic pain  practice relaxation or meditation daily  tell myself I can (not I can't) think of new ways to do favorite things  use distraction techniques  use  relaxation during pain Follow Up Plan: Telephone follow up appointment with care management team member scheduled for: 01/15/21 at 3:30 PM The patient has been provided with contact information for the care management team and has been advised to call with any health related questions or concerns.       Patient Care Plan: CCM Pharmacy Care Plan     Problem Identified: Problem: Hypertension, Hyperlipidemia, Atrial Fibrillation, Heart Failure, COPD, Asthma, Chronic Kidney Disease, Osteoporosis and Breast cancer, chronic pain, insomnia, and hyperthyroidism      Long-Range Goal: Patient-Specific Goal   Start Date: 06/11/2020  Expected End Date: 06/11/2021  Recent Progress: On track  Priority: High  Note:   Current Barriers:  Unable to independently monitor therapeutic efficacy Unable to achieve control of cholesterol  Suboptimal therapeutic regimen for COPD/asthma  Pharmacist Clinical Goal(s):  Patient will verbalize ability to afford treatment regimen achieve adherence to monitoring guidelines and medication adherence to achieve therapeutic efficacy achieve control of cholesterol as evidenced by next lipid panel  through collaboration with PharmD and provider.   Interventions: 1:1 collaboration with [name], [name], MD regarding development and update of comprehensive plan of care as evidenced by provider attestation and co-signature Inter-disciplinary care team collaboration (see longitudinal plan of care) Comprehensive medication review performed; medication list updated in electronic medical record  Hypertension (BP goal <140/90) -Controlled -Current treatment: Carvedilol 12.5 mg, 1 tablet twice daily Entresto 49-51 mg 1 tablet twice daily Spironolactone 25 mg 1 tablet daily -Medications previously tried: none  -Current home readings: 120/70, nothing > 140 -Current dietary habits: limits salt intake -Current exercise habits: limited in ability to exercise -Denies  hypotensive/hypertensive symptoms -Educated on Importance of home blood pressure monitoring; Proper BP monitoring technique; Symptoms of hypotension and importance of maintaining adequate hydration; -Counseled to monitor BP at home weekly, document, and provide log at future appointments -Counseled on diet and exercise extensively Recommended to continue current medication  Hyperlipidemia/history of stroke: (LDL goal < 70) -Not ideally controlled -Current treatment: Rosuvastatin 40mg , 1 tablet once daily  Aspirin 81mg , 1 tablet once daily  -Medications previously tried: none  -Current dietary patterns: sometimes fries food, no processed meats, no read meat and uses canolia oil -Current exercise habits: limited in ability -Educated on Cholesterol goals;  Benefits of statin for ASCVD risk reduction; Importance of limiting foods high in cholesterol; Exercise goal of 150 minutes per week; -Counseled on diet and exercise extensively Recommended to continue current medication Recommended repeat lipid panel and if LDL > 70, consider additional lipid lowering therapy  Heart Failure (Goal: manage symptoms and prevent exacerbations) -Uncontrolled -Last ejection fraction: 45-50% (Date: 11/21/19) -HF type: Systolic -NYHA Class: III (marked limitation of activity) -AHA HF Stage: C (Heart disease and symptoms present) -Current treatment: Entresto 49-51 mg 1 tablet twice daily Torsemide 20 mg 1 tablet daily Carvedilol 25mg , 1 tablet twice daily Farxiga 10 mg, 1 tablet daily -Medications previously tried: losartan  -Current home BP/HR readings: never > 140 -Current dietary habits: limits salt intake; drinks only 2 bottles of water and a cup of coffee daily -Current exercise habits: limited in ability; [facility] -Educated on Importance of weighing daily; if you gain more than 3 pounds in one day or 5 pounds in one week, call  cardiologist Proper diuretic administration  and potassium supplementation Importance of blood pressure control -Recommended to continue current medication Assessed patient finances. Patient is receiving Entresto through patient assistance.  Atrial Fibrillation (Goal: prevent stroke and major bleeding) -Controlled -CHADSVASC: 7 -Current treatment: Rate control: carvedilol 25 mg 1 tablet twice daily Anticoagulation: Eliquis 5 mg 1 tablet twice daily -Medications previously tried: none -Home BP and HR readings: nothing > 140  -Counseled on increased risk of stroke due to Afib and benefits of anticoagulation for stroke prevention; importance of adherence to anticoagulant exactly as prescribed; bleeding risk associated with Eliquis and importance of self-monitoring for signs/symptoms of bleeding; avoidance of NSAIDs due to increased bleeding risk with anticoagulants; -Recommended to continue current medication Educated on importance of taking Eliquis doses approximately 12 hours apart.  COPD/asthma (Goal: control symptoms and prevent exacerbations) -Uncontrolled -Current treatment  Albuterol HFA inhaler, 2 puffs every six hours as needed for whezing or shortness of breath  Symbicort 160-4.55 mcg/act inhale 2 puffs twice daily - does not use or have -Medications previously tried: Dentist (switched to Symbicort) -Gold Grade: Gold 1 (FEV1>80%) -Current COPD Classification:  B (high sx, <2 exacerbations/yr) -MMRC/CAT score: n/a -Pulmonary function testing: n/a -Exacerbations requiring treatment in last 6 months: none -Patient denies consistent use of maintenance inhaler (only using once daily) -Frequency of rescue inhaler use: regularly -Counseled on Proper inhaler technique; Benefits of consistent maintenance inhaler use When to use rescue inhaler Differences between maintenance and rescue inhalers -Recommended to continue current medication Assessed patient finances. Qualified for  Symbicort patient assistance.   Osteoporosis (Goal prevent fractures) -Controlled -Last DEXA Scan: 11/29/2018   T-Score femoral neck: -2.8  T-Score total hip: n/a  T-Score lumbar spine: n/a  T-Score forearm radius: -1.7  10-year probability of major osteoporotic fracture: n/a  10-year probability of hip fracture: n/a -Patient is a candidate for pharmacologic treatment due to history of hip fracture  and T-Score < -2.5 in femoral neck -Current treatment  Alendronate 70mg , 1 tablet once a week. (Saturday) - started 2017  Vitamin D 1000 units, 1 tablet once daily -Medications previously tried: none  -Recommend [phone] units of vitamin D daily. Recommend 1200 mg of calcium daily from dietary and supplemental sources. Counseled on oral bisphosphonate administration: take in the morning, 30 minutes prior to food with 6-8 oz of water. Do not lie down for at least 30 minutes after taking. Recommend weight-bearing and muscle strengthening exercises for building and maintaining bone density. -Recommended to continue current medication Educated on drug holiday after repeat bone density scan  Chronic pain (Goal: minimize symptoms) -Controlled -Current treatment  Diclofenac 1%gel, apply 4g four times daily  Aspercreme- as needed  APAP 500mg , 2 tablet daily as needed for pain  Tramadol 50mg , 1 tablet three times daily  -Medications previously tried: none  -Recommended to continue current medication  Hyperthyroidism (Goal: TSH 0.35-4.5) -Controlled -Current treatment  Methimazole 5mg , 3 tablets every evening -Medications previously tried: none  -Recommended to continue current medication  Breast cancer (Goal: remission) -Controlled -Current treatment  Tamoxifen 20mg , 1 tablet once daily (started 2019) -Medications previously tried: none  -Recommended to continue current medication  Insomnia (Goal: improve quality and quantity of sleep) -Not ideally controlled -Current treatment   Mirtazapine 15 mg 1/2 tablet at bedtime -Medications previously tried: diphenydramine, trazodone -Counseled on practicing good sleep hygiene by setting a sleep schedule and maintaining it, avoid excessive napping, following a nightly routine, avoiding screen time for 30-60 minutes before going to bed, and making the bedroom a cool, quiet  and dark space  GERD (Goal: minimize symptoms) -Controlled -Current treatment  Pantoprazole 40 mg 1 tablet daily as needed -Medications previously tried: none  -Recommended to continue current medication   Health Maintenance -Vaccine gaps: none -Current therapy:  Senokot-S 8.6-50 mg 2 tablets at bedtime Valacyclovir 500 mg 1 tablet twice daily as needed for outbreaks -Educated on Cost vs benefit of each product must be carefully weighed by individual consumer -Patient is satisfied with current therapy and denies issues -Recommended to continue current medication  Patient Goals/Self-Care Activities Patient will:  - take medications as prescribed check blood pressure weekly, document, and provide at future appointments weigh daily, and contact provider if weight gain of > 3 lbs in one day or > 5 lbs in one week engage in dietary modifications by limiting fried foods  Follow Up Plan: Telephone follow up appointment with care management team member scheduled for: 6 months       Patient verbalizes understanding of instructions provided today and agrees to view in [place].  Face to Face appointment with pharmacist scheduled for:  6 months  Viona Gilmore, [name]

## 2021-01-02 ENCOUNTER — Telehealth: Payer: Medicare Other

## 2021-01-02 ENCOUNTER — Ambulatory Visit (HOSPITAL_BASED_OUTPATIENT_CLINIC_OR_DEPARTMENT_OTHER): Payer: Medicare Other | Admitting: Physical Therapy

## 2021-01-05 ENCOUNTER — Other Ambulatory Visit: Payer: Self-pay

## 2021-01-05 MED ORDER — ENTRESTO 49-51 MG PO TABS: 1.0000 | ORAL_TABLET | Freq: Two times a day (BID) | ORAL | 0 refills | Status: DC

## 2021-01-06 ENCOUNTER — Ambulatory Visit (HOSPITAL_BASED_OUTPATIENT_CLINIC_OR_DEPARTMENT_OTHER): Payer: Medicare Other | Admitting: Physical Therapy

## 2021-01-06 ENCOUNTER — Telehealth: Payer: Self-pay | Admitting: Pharmacist

## 2021-01-06 NOTE — Chronic Care Management (AMB) (Signed)
Chronic Care Management Pharmacy Assistant   Name: Danielle [name]  MRN: [id number] DOB: [birth year]   Reason for Encounter: Patient Assistance application completion for [name].   Completed application for [employer] to receive patient assistqance. Do not need to call the patient per Danielle [name], PharmD. Will mail out to patient on 01/09/21. A detailed message was left for Dr. Oleh [name] nurse to fax prescription over to [facility] to complete patients shipment.  Medications: Outpatient Encounter Medications as of 01/06/2021  Medication Sig   albuterol (PROVENTIL HFA;VENTOLIN HFA) 108 (90 Base) MCG/ACT inhaler Inhale 2 puffs into the lungs every 6 (six) hours as needed for wheezing or shortness of breath.   alendronate (FOSAMAX) 70 MG tablet Take 1 tablet (70 mg total) by mouth once a week. Take with a full glass of water on an empty stomach.   apixaban (ELIQUIS) 5 MG TABS tablet Take 1 tablet (5 mg total) by mouth 2 (two) times daily.   ascorbic acid (VITAMIN C) 500 MG tablet Take 500 mg by mouth daily.   budesonide-formoterol (SYMBICORT) 160-4.5 MCG/ACT inhaler Inhale 2 puffs into the lungs daily as needed.   carvedilol (COREG) 12.5 MG tablet TAKE 1 TABLET BY MOUTH  TWICE DAILY WITH A MEAL   Cholecalciferol 25 MCG (1000 UT) tablet Take 1,000 Units by mouth daily.   dapagliflozin propanediol (FARXIGA) 10 MG TABS tablet Take 1 tablet (10 mg total) by mouth daily before breakfast.   DULoxetine (CYMBALTA) 30 MG capsule Take 1 capsule (30 mg total) by mouth daily.   fluticasone (FLONASE) 50 MCG/ACT nasal spray Place 1 spray into both nostrils daily.   linaclotide (LINZESS) 145 MCG CAPS capsule Take 1 capsule (145 mcg total) by mouth daily before breakfast.   methimazole (TAPAZOLE) 5 MG tablet Take 3 tablets (15 mg total) by mouth daily.   mirtazapine (REMERON) 15 MG tablet Take 1 tablet (15 mg total) by mouth at bedtime.   pantoprazole (PROTONIX) 40 MG tablet Take 1 tablet (40 mg  total) by mouth daily.   rosuvastatin (CRESTOR) 40 MG tablet TAKE 1 TABLET BY MOUTH  DAILY   sacubitril-valsartan (ENTRESTO) 49-51 MG Take 1 tablet by mouth 2 (two) times daily.   senna-docusate (SENOKOT-S) 8.6-50 MG tablet Take 2 tablets by mouth at bedtime.   spironolactone (ALDACTONE) 25 MG tablet Take 1 tablet (25 mg total) by mouth daily.   terconazole (TERAZOL 7) 0.4 % vaginal cream Place 1 applicator vaginally at bedtime.   torsemide (DEMADEX) 20 MG tablet Take 40mg  daily alternating with 20mg  every other day   traMADol (ULTRAM) 50 MG tablet TAKE 1 TABLET BY MOUTH THREE TIMES DAILY   trolamine salicylate (ASPERCREME) 10 % cream Apply 1 application topically as needed for muscle pain.   valACYclovir (VALTREX) 500 MG tablet Take on tablet by mouth twice a day for 3 days as needed for an outbreak   No facility-administered encounter medications on file as of 01/06/2021.   Notes: Spoke with Danielle [name] at [facility] at [place] and she stated that they were in the process of calling patient today to set up and overnight delivery of the entresto and they have sent a request to Dr. Marisue [name] office to have an e request resent through to renew the entresto. Danielle [name] thanked me for my call.  Care Gaps:  AWV - scheduled for 01/19/21 Colonoscopy - never done Covid-19 vaccine booster 4 - overdue since 08/27/20 Last PCP BP: 130/80 P: 69 on 12/24/20  Star Rating Drugs:  Dapagliflozin propanediol 10mg  -  last filled on 12/02/20 30DS at [facility] Rosuvastatin 40mg  - last filled on 10/30/20 90DS at [facility] - valsartan 49-51mg  - last filled on 12/14/19 90DS at [facility] [phone]

## 2021-01-09 ENCOUNTER — Ambulatory Visit (HOSPITAL_BASED_OUTPATIENT_CLINIC_OR_DEPARTMENT_OTHER): Payer: Medicare Other | Admitting: Physical Therapy

## 2021-01-12 ENCOUNTER — Other Ambulatory Visit (HOSPITAL_COMMUNITY): Payer: Self-pay | Admitting: Cardiology

## 2021-01-14 ENCOUNTER — Ambulatory Visit
Admission: RE | Admit: 2021-01-14 | Discharge: 2021-01-14 | Disposition: A | Discharge status: Home / Self Care | Payer: Medicare Other | Source: Ambulatory Visit | Attending: Nurse Practitioner | Admitting: Nurse Practitioner

## 2021-01-14 DIAGNOSIS — M810 Age-related osteoporosis without current pathological fracture: Secondary | ICD-10-CM | POA: Diagnosis not present

## 2021-01-14 DIAGNOSIS — Z170 Estrogen receptor positive status [ER+]: Secondary | ICD-10-CM

## 2021-01-14 DIAGNOSIS — Z780 Asymptomatic menopausal state: Secondary | ICD-10-CM | POA: Diagnosis not present

## 2021-01-14 DIAGNOSIS — M85832 Other specified disorders of bone density and structure, left forearm: Secondary | ICD-10-CM | POA: Diagnosis not present

## 2021-01-15 ENCOUNTER — Ambulatory Visit: Payer: Medicare Other

## 2021-01-15 DIAGNOSIS — I480 Paroxysmal atrial fibrillation: Secondary | ICD-10-CM

## 2021-01-15 DIAGNOSIS — M159 Polyosteoarthritis, unspecified: Secondary | ICD-10-CM

## 2021-01-15 DIAGNOSIS — E782 Mixed hyperlipidemia: Secondary | ICD-10-CM

## 2021-01-15 DIAGNOSIS — I10 Essential (primary) hypertension: Secondary | ICD-10-CM

## 2021-01-15 DIAGNOSIS — I5022 Chronic systolic (congestive) heart failure: Secondary | ICD-10-CM

## 2021-01-15 NOTE — Patient Instructions (Signed)
Visit Information  Thank you for taking time to visit with me today. Please don't hesitate to contact me if I can be of assistance to you before our next scheduled telephone appointment.  Telephone follow up appointment with care management team member scheduled for: 02/17/21 at 2 PM  If you need to cancel or re-schedule our visit, please call [phone] and our care guide team will be happy to assist you.  Following is a list of the goals we discussed today:   Patient will self administer medications as prescribed Patient will attend all scheduled provider appointments Patient will call pharmacy for medication refills Patient will attend church or other social activities Patient will call provider office for new concerns or questions call office if I gain more than 2 pounds in one day or 5 pounds in one week keep legs up while sitting watch for swelling in feet, ankles and legs every day weigh myself daily follow rescue plan if symptoms flare-up eat more whole grains, fruits and vegetables, lean meats and healthy fats - make a plan to eat healthy - keep all lab appointments - check blood pressure daily - choose a place to take my blood pressure (home, clinic or office, retail store) - write blood pressure results in a log or diary - take blood pressure log to all doctor appointments - call doctor for signs and symptoms of high blood pressure - eat more whole grains, fruits and vegetables, lean meats and healthy fats - limit salt intake to 2300mg /day - call for medicine refill 2 or 3 days before it runs out - take all medications exactly as prescribed - call doctor with any symptoms you believe are related to your medicine - call doctor when you experience any new symptoms - learn how to meditate - learn relaxation techniques - practice acceptance of chronic pain - practice relaxation or meditation daily - tell myself I can (not I can't) - think of new ways to do favorite  things - use distraction techniques - use relaxation during pain Patient verbalizes understanding of instructions provided today and agrees to view in [place].  Peter Garter RN, Jackquline Denmark, CDE Care Management Coordinator [facility] [phone], Mobile [phone]

## 2021-01-15 NOTE — Chronic Care Management (AMB) (Signed)
Chronic Care Management   CCM RN Visit Note  01/15/2021 Name: Danielle [name] MRN: [id number] DOB: [birth year]  Subjective: Danielle [name] is a 75 y.o. year old female who is a primary care patient of [name], [name], MD. The care management team was consulted for assistance with disease management and care coordination needs.    Engaged with patient by telephone for follow up visit in response to provider referral for case management and/or care coordination services.   Consent to Services:  The patient was given information about Chronic Care Management services, agreed to services, and gave verbal consent prior to initiation of services.  Please see initial visit note for detailed documentation.   Patient agreed to services and verbal consent obtained.   Assessment: Review of patient past medical history, allergies, medications, health status, including review of consultants reports, laboratory and other test data, was performed as part of comprehensive evaluation and provision of chronic care management services.   SDOH (Social Determinants of Health) assessments and interventions performed:    CCM Care Plan  Allergies  Allergen Reactions   Lisinopril Cough   Tetanus Toxoid Adsorbed Swelling    Arm swelling   Other Cough    PEANUTS    Outpatient Encounter Medications as of 01/15/2021  Medication Sig   albuterol (PROVENTIL HFA;VENTOLIN HFA) 108 (90 Base) MCG/ACT inhaler Inhale 2 puffs into the lungs every 6 (six) hours as needed for wheezing or shortness of breath.   alendronate (FOSAMAX) 70 MG tablet Take 1 tablet (70 mg total) by mouth once a week. Take with a full glass of water on an empty stomach.   apixaban (ELIQUIS) 5 MG TABS tablet Take 1 tablet (5 mg total) by mouth 2 (two) times daily.   ascorbic acid (VITAMIN C) 500 MG tablet Take 500 mg by mouth daily.   budesonide-formoterol (SYMBICORT) 160-4.5 MCG/ACT inhaler Inhale 2 puffs into the  lungs daily as needed.   carvedilol (COREG) 12.5 MG tablet TAKE 1 TABLET BY MOUTH  TWICE DAILY WITH A MEAL   Cholecalciferol 25 MCG (1000 UT) tablet Take 1,000 Units by mouth daily.   DULoxetine (CYMBALTA) 30 MG capsule Take 1 capsule (30 mg total) by mouth daily.   FARXIGA 10 MG TABS tablet TAKE 1 TABLET BY MOUTH ONCE DAILY BEFORE BREAKFAST   fluticasone (FLONASE) 50 MCG/ACT nasal spray Place 1 spray into both nostrils daily.   linaclotide (LINZESS) 145 MCG CAPS capsule Take 1 capsule (145 mcg total) by mouth daily before breakfast.   methimazole (TAPAZOLE) 5 MG tablet Take 3 tablets (15 mg total) by mouth daily.   mirtazapine (REMERON) 15 MG tablet Take 1 tablet (15 mg total) by mouth at bedtime.   pantoprazole (PROTONIX) 40 MG tablet Take 1 tablet (40 mg total) by mouth daily.   rosuvastatin (CRESTOR) 40 MG tablet TAKE 1 TABLET BY MOUTH  DAILY   sacubitril-valsartan (ENTRESTO) 49-51 MG Take 1 tablet by mouth 2 (two) times daily.   senna-docusate (SENOKOT-S) 8.6-50 MG tablet Take 2 tablets by mouth at bedtime.   spironolactone (ALDACTONE) 25 MG tablet Take 1 tablet (25 mg total) by mouth daily.   terconazole (TERAZOL 7) 0.4 % vaginal cream Place 1 applicator vaginally at bedtime.   torsemide (DEMADEX) 20 MG tablet Take 68m daily alternating with 267mevery other day   traMADol (ULTRAM) 50 MG tablet TAKE 1 TABLET BY MOUTH THREE TIMES DAILY   trolamine salicylate (ASPERCREME) 10 % cream Apply 1 application topically as needed for muscle pain.  valACYclovir (VALTREX) 500 MG tablet Take on tablet by mouth twice a day for 3 days as needed for an outbreak   No facility-administered encounter medications on file as of 01/15/2021.    Patient Active Problem List   Diagnosis Date Noted   Depression, major, single episode, moderate ([place]) 12/08/2020   Gait abnormality 07/31/2020   Paresthesia 07/31/2020   Low back pain with bilateral sciatica 07/31/2020   Pneumonia due to COVID-19 virus     Bacteremia due to Gram-positive bacteria 08/29/2018   Abscess    Cellulitis of left breast 08/28/2018   Cellulitis of left abdominal wall 08/28/2018   Encounter for routine gynecological examination 04/28/2018   Stage 3 chronic kidney disease ([place]) 09/27/2017   Recurrent genital herpes 04/13/2017   Chronic pain disorder 01/25/2017   Malignant neoplasm of upper-outer quadrant of left breast in female, estrogen receptor positive ([place]) 12/16/2016   Chronic knee pain 12/07/2016   Chronic bilateral low back pain without sciatica 12/07/2016   Peripheral musculoskeletal gait disorder 12/07/2016   Encounter for therapeutic drug monitoring 06/16/2016   Generalized osteoarthritis of multiple sites 05/04/2016   Aortic valve disease 04/10/2016   S/P AVR 04/10/2016   NICM (nonischemic cardiomyopathy) ([place]) 04/10/2016   Upper airway cough syndrome 03/14/2016   Morbid obesity ([place]) 03/14/2016   COPD (chronic obstructive pulmonary disease) ([place]) 03/11/2016   Thrombocytopenia ([place]) 12/12/2015   Anemia 12/12/2015   Vitamin D deficiency 12/12/2015   Acute urinary retention 12/11/2015   Osteoporosis 12/11/2015   Hip fracture ([place]) 12/10/2015   Aortic atherosclerosis ([place]) 12/10/2015   Lung blebs ([place]) 12/03/2015   Pelvic fracture ([place]) 08/19/2015   Fall at home 08/19/2015   Essential hypertension 08/19/2015   Mixed hyperlipidemia 08/19/2015   Asthma 08/19/2015   Paroxysmal atrial fibrillation ([place]) 08/19/2015   Hyperthyroidism 08/19/2015   Chronic HFrEF (heart failure with reduced ejection fraction) ([facility])    Coronary artery disease    Aortic stenosis     Conditions to be addressed/monitored:Atrial Fibrillation, CHF, HTN, HLD, Osteoarthritis, and chronic pain  Care Plan : RNCM: Cardiovascular Disease Management( Heart Failure, HTN, paroxysmal atrial fibrillation)  Updates made by Dimitri Ped, RN since 01/15/2021 12:00 AM  Completed 01/15/2021   Problem: Lack of long term management of  Cardiovascular Disease( Heart Failure, HTN, paroxysmal atrial fibrillation) Resolved 01/15/2021  Priority: Medium     Long-Range Goal: Long term self mangement of Cardiovascular Disease Management( Heart Failure, HTN, paroxysmal atrial fibrillation) Completed 01/15/2021  Start Date: 06/24/2020  Expected End Date: 04/29/2021  Recent Progress: On track  Priority: Medium  Note:   Resolving due to duplicate goal  Current Barriers:  Knowledge deficits related to basic heart failure pathophysiology and self care management of Cardiovascular Disease Management( Heart Failure, HTN, paroxysmal atrial fibrillation)  Does not adhere to provider recommendations re: low sodium diet at times  States that the torsemide is working better to help her keep her fluid off and she is taking the [place] as ordered.  Pt reports weighting daily and her weight was 240 today denies any chest pains, reports her swelling in legs has improved.  Reports she wears compression hose but not always, reports shortness of breath with exertion,  denies any recent atrial fibrillation, reports she has her blood checked regularly for her Coumadin, States she is checking her B/P 4-5 times a week and it was 112/74 when she last checked it. States she tries to follow a low sodium diet and the fluid restriction that the heart  failure clinic gave her.  States she had surgery for  vaginal bleeding and Dr. [name] took her off of the tamoxifen.  States she had an MRI of her heart yesterday   Nurse Case Manager Clinical Goal(s):  patient will weigh self daily and record patient will verbalize understanding of Heart Failure Action Plan and when to call doctor patient will take all Heart Failure mediations as prescribed Interventions:  Collaboration with [name], [name], MD regarding development and update of comprehensive plan of care as evidenced by provider attestation and co-signature Inter-disciplinary care team collaboration (see longitudinal  plan of care) Reveiwed basic overview and discussion of pathophysiology of Heart Failure Reinforced to follow a  low sodium diet and fluid restrictions Reinforced Heart Failure Action Plan  Reinforced importance of daily weight Reinforced role of diuretics in prevention of fluid overload Reviewed s/sx of bleeding to call provider and reviewed foods to avoid while on Coumadin Reviewed upcoming provider appointments Dr. [name] 12/08/20, Heart failure clinic 12/17/20, Dr. Saunders Revel cardiology 05/20/21, CCM PharmD 01/02/21 Self-Care Activities:  Takes Heart Failure Medications as prescribed Weighs daily and record (notifying MD of 3 lb weight gain over night or 5 lb in a week)  Patient Goals:  - Take Heart Failure Medications as prescribed - Weigh daily and record (notify MD with 3 lb weight gain over night or 5 lb in a week) - Follow CHF Action Plan - Adhere to low sodium diet - begin a heart failure diary - bring diary to all appointments - develop a rescue plan - eat more whole grains, fruits and vegetables, lean meats and healthy fats - follow rescue plan if symptoms flare-up - know when to call the doctor - follow activity or exercise plan - make an activity or exercise plan - meet with physical therapist - pace activity allowing for rest - do ankle pumps when sitting - keep legs up while sitting - track weight in diary - use salt in moderation - watch for swelling in feet, ankles and legs every day -Calls provider office for new concerns, questions, or BP outside discussed parameters -Follows a low sodium diet/DASH diet -check blood pressure 3 times per week - write blood pressure and weight results in a log or diary  -Plan to fill your medication box weekly  Follow Up Plan: Telephone follow up appointment with care management team member scheduled for:  01/15/21 at 3:30 PM The patient has been provided with contact information for the care management team and has been advised to call  with any health related questions or concerns.      Care Plan : RNCM:Chronic Pain (Adult)  Updates made by Dimitri Ped, RN since 01/15/2021 12:00 AM  Completed 01/15/2021   Problem: Chronic Pain Management (Chronic Pain) Resolved 01/15/2021  Priority: Medium     Long-Range Goal: Chronic Pain Managed Completed 01/15/2021  Start Date: 06/24/2020  Expected End Date: 04/29/2021  Recent Progress: On track  Priority: Medium  Note:   Resolving due to duplicate goal  Current Barriers:  Knowledge Deficits related to self-health management of acute or chronic pain of lower back due to degenerative disc disease  Chronic Disease Management support and education needs related to chronic pain Chronic Disease Management support and education needs related to chronic back pain Does not adhere to provider recommendations re: exercise Unable to perform IADLs independently uses walker  Pt states she is to have injections in her knees to help with the pain.  States she is to  go Monday to be evaluated for water PT for her knees.  States she would like to get strong enough to only need a cane and be able to help her husband around the house more. states she is taking her Tramadol as ordered.  Denies any falls.  States she uses her walker in her home but uses a wheelchair when she goes to appointments Clinical Goal(s):  patient will verbalize understanding of plan for pain management. , patient will attend all scheduled medical appointments:  Dr. Read Drivers 01/27/21, PT at [facility] 12/08/20, patient will demonstrate use of different relaxation  skills and/or diversional activities to assist with pain reduction (distraction, imagery, relaxation, massage, acupressure, TENS, heat, and cold application., patient will report pain at a level less than 3 to 4 on a 10-10 rating scale., patient will use pharmacological and nonpharmacological pain relief strategies as prescribed. , patient will verbalize acceptable  level of pain relief and ability to engage in desired activities, and patient will engage in desired activities without an increase in pain level Interventions:  Collaboration with [name], [name], MD regarding development and update of comprehensive plan of care as evidenced by provider attestation and co-signature Pain assessment performed Medications reviewed Reinforced on methods of chronic pain control  Discussed plans with patient for ongoing care management follow up and provided patient with direct contact information for care management team Reviewed scheduled/upcoming provider appointments including:  Dr. Read Drivers 01/27/21, PT at [facility] 12/08/20 Reinforced benefits of exercise and therapy to help with chronic pain and mobility  Patient Goals/Self Care Activities:  Will self-administer medications as prescribed Will attend all scheduled provider appointments Will call pharmacy for medication refills 7 days prior to needed refill date Patient will calls provider office for new concerns or questions  learn how to meditate learn relaxation techniques practice acceptance of chronic pain  practice relaxation or meditation daily  tell myself I can (not I can't) think of new ways to do favorite things  use distraction techniques  use relaxation during pain Follow Up Plan: Telephone follow up appointment with care management team member scheduled for: 01/15/21 at 3:30 PM The patient has been provided with contact information for the care management team and has been advised to call with any health related questions or concerns.       Care Plan : RN Care Manager Plan of Care  Updates made by Dimitri Ped, RN since 01/15/2021 12:00 AM     Problem: Chronic Disease Management and Care Coordination Needs (CHF, HTN, Atrial Fib,HLD, chronic pain)   Priority: High     Long-Range Goal: Establish Plan of Care for Chronic Disease Management Needs (CHF, HTN, Atrial Fib, HLD,chronic  pain)   Start Date: 01/15/2021  Expected End Date: 07/14/2021  This Visit's Progress: On track  Priority: High  Note:   Current Barriers:  Chronic Disease Management support and education needs related to Atrial Fibrillation, CHF, HTN, HLD, Osteoarthritis, and chronic pain States that the torsemide is working better to help her keep her fluid off and she is taking the [place] as ordered.  Pt reports weighting daily. Denies any chest pains, reports her swelling in legs has improved.  Reports she wears compression hose but not always, reports shortness of breath with exertion,  denies any recent atrial fibrillation, reports she has her blood checked regularly for her Coumadin, States she is checking her B/P 4-5 times a week and it was 100/68 when she last checked it. Denies any dizziness States she tries  to follow a low sodium diet and the fluid restriction that the heart failure clinic gave her. States she did slide to the floor in her bathroom and hurt her knee while trying to get up.  States her knee is better now but she still has has chronic pain in her knees.  States she has not been able to go to her water therapy while her knee is healing  RNCM Clinical Goal(s):  Patient will verbalize understanding of plan for management of Atrial Fibrillation, CHF, HTN, HLD, Osteoarthritis, and chronic pain verbalize basic understanding of  Atrial Fibrillation, CHF, HTN, HLD, Osteoarthritis, and chronic pain disease process and self health management plan . take all medications exactly as prescribed and will call provider for medication related questions attend all scheduled medical appointments: HF clinic 02/10/21, pain 01/27/21 demonstrate Improved adherence to prescribed treatment plan for Atrial Fibrillation, CHF, HTN, HLD, Osteoarthritis, and chronic pain as evidenced by reading within limits, voiced adherence to plan of care continue to work with RN Care Manager to address care management and care  coordination needs related to  Atrial Fibrillation, CHF, HTN, HLD, Osteoarthritis, and chronic pain  through collaboration with RN Care manager, provider, and care team.   Interventions: 1:1 collaboration with primary care provider regarding development and update of comprehensive plan of care as evidenced by provider attestation and co-signature Inter-disciplinary care team collaboration (see longitudinal plan of care) Evaluation of current treatment plan related to  self management and patient's adherence to plan as established by provider  Pain Interventions:Goal on track:  Yes Pain assessment performed Medications reviewed Reviewed provider established plan for pain management; Discussed importance of adherence to all scheduled medical appointments; Counseled on the importance of reporting any/all new or changed pain symptoms or management strategies to pain management provider; Advised patient to report to care team affect of pain on daily activities; Discussed use of relaxation techniques and/or diversional activities to assist with pain reduction (distraction, imagery, relaxation, massage, acupressure, TENS, heat, and cold application; Reviewed with patient prescribed pharmacological and nonpharmacological pain relief strategies; Reviewed safety and fall precautions   Hypertension Interventions:Goal on track:  Yes Last practice recorded BP readings:  BP Readings from Last 3 Encounters:  12/24/20 130/80  12/20/20 122/85  12/17/20 110/70  Most recent eGFR/CrCl:  Lab Results  Component Value Date   EGFR 55 (L) 12/22/2016    No components found for: CRCL  Evaluation of current treatment plan related to hypertension self management and patient's adherence to plan as established by provider; Provided education to patient re: stroke prevention, s/s of heart attack and stroke; Discussed plans with patient for ongoing care management follow up and provided patient with direct contact  information for care management team; Advised patient, providing education and rationale, to monitor blood pressure daily and record, calling PCP for findings outside established parameters;  Hyperlipidemia Interventions:Goal on track:  Yes Medication review performed; medication list updated in electronic medical record.  Provider established cholesterol goals reviewed; Counseled on importance of regular laboratory monitoring as prescribed; Reviewed role and benefits of statin for ASCVD risk reduction;  Heart Failure Interventions:Goal on track:  Yes Basic overview and discussion of pathophysiology of Heart Failure reviewed; Provided education on low sodium diet; Discussed importance of daily weight and advised patient to weigh and record daily; Reviewed role of diuretics in prevention of fluid overload and management of heart failure; Discussed the importance of keeping all appointments with provider;  Patient Goals/Self-Care Activities: Patient will self administer medications as  prescribed Patient will attend all scheduled provider appointments Patient will call pharmacy for medication refills Patient will attend church or other social activities Patient will call provider office for new concerns or questions call office if I gain more than 2 pounds in one day or 5 pounds in one week keep legs up while sitting watch for swelling in feet, ankles and legs every day weigh myself daily follow rescue plan if symptoms flare-up eat more whole grains, fruits and vegetables, lean meats and healthy fats - make a plan to eat healthy - keep all lab appointments - check blood pressure daily - choose a place to take my blood pressure (home, clinic or office, retail store) - write blood pressure results in a log or diary - take blood pressure log to all doctor appointments - call doctor for signs and symptoms of high blood pressure - eat more whole grains, fruits and vegetables, lean meats and  healthy fats - limit salt intake to 2354m/day - call for medicine refill 2 or 3 days before it runs out - take all medications exactly as prescribed - call doctor with any symptoms you believe are related to your medicine - call doctor when you experience any new symptoms - learn how to meditate - learn relaxation techniques - practice acceptance of chronic pain - practice relaxation or meditation daily - tell myself I can (not I can't) - think of new ways to do favorite things - use distraction techniques - use relaxation during pain Follow Up Plan:  Telephone follow up appointment with care management team member scheduled for:  02/17/21 The patient has been provided with contact information for the care management team and has been advised to call with any health related questions or concerns.        Plan:Telephone follow up appointment with care management team member scheduled for:  02/17/21 The patient has been provided with contact information for the care management team and has been advised to call with any health related questions or concerns.  MPeter GarterRN, BJackquline Denmark CDE Care Management Coordinator [facility] ([phone] Mobile [phone]

## 2021-01-16 ENCOUNTER — Ambulatory Visit
Admission: RE | Admit: 2021-01-16 | Discharge: 2021-01-16 | Disposition: A | Discharge status: Home / Self Care | Payer: Medicare Other | Source: Ambulatory Visit | Attending: Nurse Practitioner | Admitting: Nurse Practitioner

## 2021-01-16 ENCOUNTER — Other Ambulatory Visit: Payer: Self-pay

## 2021-01-16 ENCOUNTER — Other Ambulatory Visit (HOSPITAL_COMMUNITY): Payer: Self-pay | Admitting: Cardiology

## 2021-01-16 DIAGNOSIS — Z853 Personal history of malignant neoplasm of breast: Secondary | ICD-10-CM | POA: Diagnosis not present

## 2021-01-16 DIAGNOSIS — R922 Inconclusive mammogram: Secondary | ICD-10-CM | POA: Diagnosis not present

## 2021-01-16 DIAGNOSIS — C50412 Malignant neoplasm of upper-outer quadrant of left female breast: Secondary | ICD-10-CM

## 2021-01-16 DIAGNOSIS — Z170 Estrogen receptor positive status [ER+]: Secondary | ICD-10-CM

## 2021-01-16 MED ORDER — DAPAGLIFLOZIN PROPANEDIOL 10 MG PO TABS: 10.0000 mg | ORAL_TABLET | Freq: Every day | ORAL | 11 refills | Status: DC

## 2021-01-19 ENCOUNTER — Ambulatory Visit (INDEPENDENT_AMBULATORY_CARE_PROVIDER_SITE_OTHER): Payer: Medicare Other

## 2021-01-19 VITALS — Ht 67.0 in | Wt 251.0 lb

## 2021-01-19 DIAGNOSIS — Z0000 Encounter for general adult medical examination without abnormal findings: Secondary | ICD-10-CM | POA: Diagnosis not present

## 2021-01-19 NOTE — Patient Instructions (Signed)
Danielle [name] , Thank you for taking time to come for your Medicare Wellness Visit. I appreciate your ongoing commitment to your health goals. Please review the following plan we discussed and let me know if I can assist you in the future.   Screening recommendations/referrals: Colonoscopy: going to discuss with PCP Mammogram: completed 01/16/2021 Bone Density: completed 01/14/2021 Recommended yearly ophthalmology/optometry visit for glaucoma screening and checkup Recommended yearly dental visit for hygiene and checkup  Vaccinations: Influenza vaccine: completed 12/08/2020 Pneumococcal vaccine: completed 12/15/2018 Tdap vaccine: allergy Shingles vaccine: completed   Covid-19: 07/02/2020, 02/28/2020, 04/28/2019, 03/24/2019  Advanced directives: Please bring a copy of your POA (Power of Attorney) and/or Living Will to your next appointment.   Conditions/risks identified: none  Next appointment: Follow up in one year for your annual wellness visit    Preventive Care 65 Years and Older, Female Preventive care refers to lifestyle choices and visits with your health care provider that can promote health and wellness. What does preventive care include? A yearly physical exam. This is also called an annual well check. Dental exams once or twice a year. Routine eye exams. Ask your health care provider how often you should have your eyes checked. Personal lifestyle choices, including: Daily care of your teeth and gums. Regular physical activity. Eating a healthy diet. Avoiding tobacco and drug use. Limiting alcohol use. Practicing safe sex. Taking low-dose aspirin every day. Taking vitamin and mineral supplements as recommended by your health care provider. What happens during an annual well check? The services and screenings done by your health care provider during your annual well check will depend on your age, overall health, lifestyle risk factors, and family history of  disease. Counseling  Your health care provider may ask you questions about your: Alcohol use. Tobacco use. Drug use. Emotional well-being. Home and relationship well-being. Sexual activity. Eating habits. History of falls. Memory and ability to understand (cognition). Work and work Statistician. Reproductive health. Screening  You may have the following tests or measurements: Height, weight, and BMI. Blood pressure. Lipid and cholesterol levels. These may be checked every 5 years, or more frequently if you are over 52 years old. Skin check. Lung cancer screening. You may have this screening every year starting at age 40 if you have a 30-pack-year history of smoking and currently smoke or have quit within the past 15 years. Fecal occult blood test (FOBT) of the stool. You may have this test every year starting at age 70. Flexible sigmoidoscopy or colonoscopy. You may have a sigmoidoscopy every 5 years or a colonoscopy every 10 years starting at age 86. Hepatitis C blood test. Hepatitis B blood test. Sexually transmitted disease (STD) testing. Diabetes screening. This is done by checking your blood sugar (glucose) after you have not eaten for a while (fasting). You may have this done every 1-3 years. Bone density scan. This is done to screen for osteoporosis. You may have this done starting at age 78. Mammogram. This may be done every 1-2 years. Talk to your health care provider about how often you should have regular mammograms. Talk with your health care provider about your test results, treatment options, and if necessary, the need for more tests. Vaccines  Your health care provider may recommend certain vaccines, such as: Influenza vaccine. This is recommended every year. Tetanus, diphtheria, and acellular pertussis (Tdap, Td) vaccine. You may need a Td booster every 10 years. Zoster vaccine. You may need this after age 25. Pneumococcal 13-valent conjugate (PCV13) vaccine. One  dose is recommended after age 35. Pneumococcal polysaccharide (PPSV23) vaccine. One dose is recommended after age 4. Talk to your health care provider about which screenings and vaccines you need and how often you need them. This information is not intended to replace advice given to you by your health care provider. Make sure you discuss any questions you have with your health care provider. Document Released: 03/14/2015 Document Revised: 11/05/2015 Document Reviewed: 12/17/2014 Elsevier Interactive Patient Education  2017 [place] Prevention in the Home Falls can cause injuries. They can happen to people of all ages. There are many things you can do to make your home safe and to help prevent falls. What can I do on the outside of my home? Regularly fix the edges of walkways and driveways and fix any cracks. Remove anything that might make you trip as you walk through a door, such as a raised step or threshold. Trim any bushes or trees on the path to your home. Use bright outdoor lighting. Clear any walking paths of anything that might make someone trip, such as rocks or tools. Regularly check to see if handrails are loose or broken. Make sure that both sides of any steps have handrails. Any raised decks and porches should have guardrails on the edges. Have any leaves, snow, or ice cleared regularly. Use sand or salt on walking paths during winter. Clean up any spills in your garage right away. This includes oil or grease spills. What can I do in the bathroom? Use night lights. Install grab bars by the toilet and in the tub and shower. Do not use towel bars as grab bars. Use non-skid mats or decals in the tub or shower. If you need to sit down in the shower, use a plastic, non-slip stool. Keep the floor dry. Clean up any water that spills on the floor as soon as it happens. Remove soap buildup in the tub or shower regularly. Attach bath mats securely with double-sided  non-slip rug tape. Do not have throw rugs and other things on the floor that can make you trip. What can I do in the bedroom? Use night lights. Make sure that you have a light by your bed that is easy to reach. Do not use any sheets or blankets that are too big for your bed. They should not hang down onto the floor. Have a firm chair that has side arms. You can use this for support while you get dressed. Do not have throw rugs and other things on the floor that can make you trip. What can I do in the kitchen? Clean up any spills right away. Avoid walking on wet floors. Keep items that you use a lot in easy-to-reach places. If you need to reach something above you, use a strong step stool that has a grab bar. Keep electrical cords out of the way. Do not use floor polish or wax that makes floors slippery. If you must use wax, use non-skid floor wax. Do not have throw rugs and other things on the floor that can make you trip. What can I do with my stairs? Do not leave any items on the stairs. Make sure that there are handrails on both sides of the stairs and use them. Fix handrails that are broken or loose. Make sure that handrails are as long as the stairways. Check any carpeting to make sure that it is firmly attached to the stairs. Fix any carpet that is loose or worn. Avoid  having throw rugs at the top or bottom of the stairs. If you do have throw rugs, attach them to the floor with carpet tape. Make sure that you have a light switch at the top of the stairs and the bottom of the stairs. If you do not have them, ask someone to add them for you. What else can I do to help prevent falls? Wear shoes that: Do not have high heels. Have rubber bottoms. Are comfortable and fit you well. Are closed at the toe. Do not wear sandals. If you use a stepladder: Make sure that it is fully opened. Do not climb a closed stepladder. Make sure that both sides of the stepladder are locked into place. Ask  someone to hold it for you, if possible. Clearly mark and make sure that you can see: Any grab bars or handrails. First and last steps. Where the edge of each step is. Use tools that help you move around (mobility aids) if they are needed. These include: Canes. Walkers. Scooters. Crutches. Turn on the lights when you go into a dark area. Replace any light bulbs as soon as they burn out. Set up your furniture so you have a clear path. Avoid moving your furniture around. If any of your floors are uneven, fix them. If there are any pets around you, be aware of where they are. Review your medicines with your doctor. Some medicines can make you feel dizzy. This can increase your chance of falling. Ask your doctor what other things that you can do to help prevent falls. This information is not intended to replace advice given to you by your health care provider. Make sure you discuss any questions you have with your health care provider. Document Released: 12/12/2008 Document Revised: 07/24/2015 Document Reviewed: 03/22/2014 Elsevier Interactive Patient Education  2017 [employer].

## 2021-01-19 NOTE — Progress Notes (Signed)
I connected with Madelyn Flavors today by telephone and verified that I am speaking with the correct person using two identifiers. Location patient: home Location provider: work Persons participating in the virtual visit: Madelyn Flavors, Glenna Durand LPN.   I discussed the limitations, risks, security and privacy concerns of performing an evaluation and management service by telephone and the availability of in person appointments. I also discussed with the patient that there may be a patient responsible charge related to this service. The patient expressed understanding and verbally consented to this telephonic visit.    Interactive audio and video telecommunications were attempted between this provider and patient, however failed, due to patient having technical difficulties OR patient did not have access to video capability.  We continued and completed visit with audio only.     Vital signs may be patient reported or missing.  Subjective:   Kjerstin Abrigo is a 75 y.o. female who presents for Medicare Annual (Subsequent) preventive examination.  Review of Systems     Cardiac Risk Factors include: advanced age (>7men, >43 women);dyslipidemia;hypertension;obesity (BMI >30kg/m2)     Objective:    Today's Vitals   01/19/21 1026 01/19/21 1027  Weight: 251 lb (113.9 kg)   Height: 5\' 7"  (1.702 m)   PainSc:  5    Body mass index is 39.31 kg/m.  Advanced Directives 01/19/2021 12/08/2020 11/17/2020 09/19/2020 08/19/2020 08/13/2020 06/24/2020  Does Patient Have a Medical Advance Directive? Yes Yes Yes Yes Yes No Yes  Type of Paramedic of [place];Living will [place];Living will [place];Living will [place];Living will Living will - -  Does patient want to make changes to medical advance directive? - - - No - Patient declined Yes (ED - Information included in AVS) - -   Copy of [place] in Chart? No - copy requested No - copy requested No - copy requested No - copy requested - - -  Would patient like information on creating a medical advance directive? - - - - - No - Patient declined -  Pre-existing out of facility DNR order (yellow form or pink MOST form) - - - - - - -    Current Medications (verified) Outpatient Encounter Medications as of 01/19/2021  Medication Sig   albuterol (PROVENTIL HFA;VENTOLIN HFA) 108 (90 Base) MCG/ACT inhaler Inhale 2 puffs into the lungs every 6 (six) hours as needed for wheezing or shortness of breath.   alendronate (FOSAMAX) 70 MG tablet Take 1 tablet (70 mg total) by mouth once a week. Take with a full glass of water on an empty stomach.   apixaban (ELIQUIS) 5 MG TABS tablet Take 1 tablet (5 mg total) by mouth 2 (two) times daily.   ascorbic acid (VITAMIN C) 500 MG tablet Take 500 mg by mouth daily.   budesonide-formoterol (SYMBICORT) 160-4.5 MCG/ACT inhaler Inhale 2 puffs into the lungs daily as needed.   carvedilol (COREG) 12.5 MG tablet TAKE 1 TABLET BY MOUTH  TWICE DAILY WITH A MEAL   Cholecalciferol 25 MCG (1000 UT) tablet Take 1,000 Units by mouth daily.   dapagliflozin propanediol (FARXIGA) 10 MG TABS tablet Take 1 tablet (10 mg total) by mouth daily before breakfast.   DULoxetine (CYMBALTA) 30 MG capsule Take 1 capsule (30 mg total) by mouth daily.   fluticasone (FLONASE) 50 MCG/ACT nasal spray Place 1 spray into both nostrils daily.   linaclotide (LINZESS) 145 MCG CAPS capsule Take 1 capsule (145 mcg total)  by mouth daily before breakfast.   methimazole (TAPAZOLE) 5 MG tablet Take 3 tablets (15 mg total) by mouth daily.   mirtazapine (REMERON) 15 MG tablet Take 1 tablet (15 mg total) by mouth at bedtime.   pantoprazole (PROTONIX) 40 MG tablet Take 1 tablet (40 mg total) by mouth daily.   rosuvastatin (CRESTOR) 40 MG tablet TAKE 1 TABLET BY MOUTH  DAILY   sacubitril-valsartan (ENTRESTO) 49-51 MG  Take 1 tablet by mouth 2 (two) times daily.   senna-docusate (SENOKOT-S) 8.6-50 MG tablet Take 2 tablets by mouth at bedtime.   spironolactone (ALDACTONE) 25 MG tablet Take 1 tablet (25 mg total) by mouth daily.   terconazole (TERAZOL 7) 0.4 % vaginal cream Place 1 applicator vaginally at bedtime.   torsemide (DEMADEX) 20 MG tablet Take 40mg  daily alternating with 20mg  every other day   traMADol (ULTRAM) 50 MG tablet TAKE 1 TABLET BY MOUTH THREE TIMES DAILY   trolamine salicylate (ASPERCREME) 10 % cream Apply 1 application topically as needed for muscle pain.   valACYclovir (VALTREX) 500 MG tablet Take on tablet by mouth twice a day for 3 days as needed for an outbreak   No facility-administered encounter medications on file as of 01/19/2021.    Allergies (verified) Lisinopril, Tetanus toxoid adsorbed, and Other   History: Past Medical History:  Diagnosis Date   Anticoagulated on Coumadin    managed by cardiology   Anxiety    Chronic constipation    Chronic systolic CHF (congestive heart failure) ([place]) 2016   cardiologist--- dr end and followed by CHF clinic   CKD (chronic kidney disease), stage III ([place])    COPD (chronic obstructive pulmonary disease) ([place])    previous had seen pulmonology --- dr Melvyn Novas, note in epic 03-14-2016, dx COPD GOLD 0   Coronary artery disease    cardiologist--- dr Durward Mallard;  last cardiac cath 01-10-20202 mild nonobstructive disease proxLAD;  per pt, had LHC prior to AVR in [place] 2016   DOE (dyspnea on exertion)    GERD (gastroesophageal reflux disease)    History of 2019 novel coronavirus disease (COVID-19) 12/19/2019   hospital admission w/ covid pneumonia , no intubatiion,  per pt symptoms resolved and back to baseline   History of cerebrovascular accident (CVA) with residual deficit    CVA in 2000 without residuals;  CVA post op AVR 01/ 2016 with residual right hand weakness   History of gastritis    History of pneumothorax    s/p right vats 11-19-2014  and  s/p left vats  12-13-2015   (both spontenaous due to bleb)   Hyperlipidemia    Hypertension    Hyperthyroidism    per pt followed by pcp,  dx approx 2014   Malignant neoplasm of upper-outer quadrant of left breast in female, estrogen receptor positive ([place]) 11/2016   oncologist-- dr Burr Medico--- dx 10/ 2018  ,  s/p left lumptectomy with node dissection;  completed radiation 04-15-2017, no chemo   Multiple thyroid nodules    in care everywhere pt had left thyroid nodule biospy 09-18-2014 benign   NICM (nonischemic cardiomyopathy) ([place]) 2016   2016  ef 30%;  last echo in epic ef 45%   OA (osteoarthritis)    Osteoporosis    Paroxysmal atrial fibrillation [facility])    cardiologist--- dr end   Personal history of radiation therapy    left breast cancer  03-08-2017  to 04-05-2017   Thrombocytopenia [facility])    followed by dr Burr Medico   Valvular stenosis,  aortic 03/12/2014   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   Wears dentures    full upper   Past Surgical History:  Procedure Laterality Date   AORTIC VALVE REPLACEMENT  03/12/2014   [facility] [place];  St Jude 36mm, medial Trifecta Bioprosthesis   BREAST LUMPECTOMY WITH RADIOACTIVE SEED AND SENTINEL LYMPH NODE BIOPSY Left 01/14/2017   Procedure: LEFT BREAST LUMPECTOMY WITH RADIOACTIVE SEED AND SENTINEL LYMPH NODE BIOPSY;  Surgeon: Rolm Bookbinder, MD;  Location: [place];  Service: General;  Laterality: Left;   CARDIAC CATHETERIZATION  02/12/2014   [facility] in [place] D & C N/A 11/17/2020   Procedure: DILATATION AND CURETTAGE /HYSTEROSCOPY WITH MYOSURE;  Surgeon: Nunzio Cobbs, MD;  Location: [facility];  Service: Gynecology;  Laterality: N/A;   INTRAMEDULLARY (IM) NAIL INTERTROCHANTERIC Left 12/10/2015   Procedure: INTRAMEDULLARY (IM) NAIL INTERTROCHANTRIC;  Surgeon: Leandrew Koyanagi, MD;  Location: [place];  Service: Orthopedics;  Laterality: Left;   OPERATIVE  ULTRASOUND N/A 11/17/2020   Procedure: OPERATIVE ULTRASOUND;  Surgeon: Nunzio Cobbs, MD;  Location: [facility];  Service: Gynecology;  Laterality: N/A;   PLEURADESIS Left 12/03/2015   Procedure: PLEURADESIS;  Surgeon: Melrose Nakayama, MD;  Location: [place];  Service: Thoracic;  Laterality: Left;   RESECTION OF APICAL BLEB Left 12/03/2015   Procedure: BLEBECTOMY;  Surgeon: Melrose Nakayama, MD;  Location: [place];  Service: Thoracic;  Laterality: Left;   RIGHT/LEFT HEART CATH AND CORONARY ANGIOGRAPHY N/A 03/10/2020   Procedure: RIGHT/LEFT HEART CATH AND CORONARY ANGIOGRAPHY;  Surgeon: [name], MD;  Location: [place] [facility];  Service: Cardiovascular;  Laterality: N/A;   VIDEO ASSISTED THORACOSCOPY Left 12/03/2015   Procedure: VIDEO ASSISTED THORACOSCOPY;  Surgeon: Melrose Nakayama, MD;  Location: [facility];  Service: Thoracic;  Laterality: Left;   VIDEO ASSISTED THORACOSCOPY (VATS) W/TALC PLEUADESIS Right 11/19/2014   in [place]   Family History  Problem Relation Age of Onset   Diabetes Mother    Heart attack Mother 26   Diabetes Father    Lung cancer Father    Diabetes Sister    Thyroid disease Sister    Diabetes Sister    HIV Brother    Social History   Socioeconomic History   Marital status: Married    Spouse name: [name]   Number of children: 0   Years of education: 12   Highest education level: 12th grade  Occupational History   Occupation: Retired in 2004  Tobacco Use   Smoking status: Former    Packs/day: 0.25    Years: 10.00    Pack years: 2.50    Types: Cigarettes    Quit date: 2012    Years since quitting: 10.8   Smokeless tobacco: Never  Vaping Use   Vaping Use: Never used  Substance and Sexual Activity   Alcohol use: No   Drug use: Never   Sexual activity: Yes    Birth control/protection: Post-menopausal  Other Topics Concern   Not on file  Social History Narrative   Lives with husband.  Ambulated  independently.   Right-handed.   No daily caffeine use.   Social Determinants of Health   Financial Resource Strain: Medium Risk   Difficulty of Paying Living Expenses: Somewhat hard  Food Insecurity: No Food Insecurity   Worried About Running Out of Food in the Last Year: Never true  Ran Out of Food in the Last Year: Never true  Transportation Needs: No Transportation Needs   Lack of Transportation (Medical): No   Lack of Transportation (Non-Medical): No  Physical Activity: Insufficiently Active   Days of Exercise per Week: 5 days   Minutes of Exercise per Session: 20 min  Stress: No Stress Concern Present   Feeling of Stress : Not at all  Social Connections: Moderately Integrated   Frequency of Communication with Friends and Family: More than three times a week   Frequency of Social Gatherings with Friends and Family: More than three times a week   Attends Religious Services: More than 4 times per year   Active Member of [employer] or Organizations: No   Attends Music therapist: Never   Marital Status: Married    Tobacco Counseling Counseling given: Not Answered   Clinical Intake:  Pre-visit preparation completed: Yes  Pain : 0-10 Pain Score: 5  Pain Type: Chronic pain Pain Location: Knee Pain Orientation: Right Pain Descriptors / Indicators: Aching Pain Onset: More than a month ago Pain Frequency: Constant     Nutritional Status: BMI > 30  Obese Nutritional Risks: None Diabetes: No  How often do you need to have someone help you when you read instructions, pamphlets, or other written materials from your doctor or pharmacy?: 1 - Never What is the last grade level you completed in school?: 14 yrs  Diabetic? no  Interpreter Needed?: No  Information entered by :: NAllen LPN   Activities of Daily Living In your present state of health, do you have any difficulty performing the following activities: 01/19/2021 11/17/2020  Hearing? N N  Vision? N N   Difficulty concentrating or making decisions? N N  Walking or climbing stairs? Y Y  Dressing or bathing? N N  Doing errands, shopping? N -  Preparing Food and eating ? N -  Using the Toilet? N -  In the past six months, have you accidently leaked urine? N -  Do you have problems with loss of bowel control? N -  Managing your Medications? N -  Managing your Finances? N -  Housekeeping or managing your Housekeeping? N -  Some recent data might be hidden    Patient Care Team: [name], [name], MD as PCP - General (Family Medicine) End, Harrell Gave, MD as PCP - Cardiology (Cardiology) [name], MD as Consulting Physician (Hematology) Kyung Rudd, MD as Consulting Physician (Radiation Oncology) Rolm Bookbinder, MD as Consulting Physician (General Surgery) [name], Charlestine Massed, NP as Nurse Practitioner (Hematology and Oncology) Dimitri Ped, RN as Case Manager Viona Gilmore, [facility] as Pharmacist (Pharmacist) Alla Feeling, NP as Nurse Practitioner (Nurse Practitioner)  Indicate any recent Medical Services you may have received from other than [facility] providers in the past year (date may be approximate).     Assessment:   This is a routine wellness examination for Adalae.  Hearing/Vision screen Vision Screening - Comments:: No regular eye exams,   Dietary issues and exercise activities discussed: Current Exercise Habits: Home exercise routine, Type of exercise: stretching;calisthenics, Time (Minutes): 20, Frequency (Times/Week): 5, Weekly Exercise (Minutes/Week): 100   Goals Addressed             This Visit's Progress    Patient Stated       01/19/2021, start walking on her own       Depression Screen [facility] 2/9 Scores 01/19/2021 12/08/2020 11/27/2020 09/19/2020 09/16/2020 08/13/2020 06/24/2020  PHQ - 2 Score 0  6 0 0 0 0 0  PHQ- 9 Score - 23 - - - - -    Fall Risk Fall Risk  01/19/2021 01/16/2021 12/08/2020 11/27/2020 09/19/2020  Falls in the past year? 0 0 0  0 0  Comment - - - - -  Number falls in past yr: 0 - 0 0 0  Injury with Fall? - - 0 0 0  Risk Factor Category  - - - - -  Risk for fall due to : Impaired mobility;Impaired balance/gait;Medication side effect - Orthopedic patient;Impaired balance/gait;Impaired mobility - -  Risk for fall due to: Comment - - - - -  Follow up Falls evaluation completed;Education provided;Falls prevention discussed - Education provided - -    FALL RISK PREVENTION PERTAINING TO THE HOME:  Any stairs in or around the home? No  If so, are there any without handrails? N/a Home free of loose throw rugs in walkways, pet beds, electrical cords, etc? Yes  Adequate lighting in your home to reduce risk of falls? Yes   ASSISTIVE DEVICES UTILIZED TO PREVENT FALLS:  Life alert? No  Use of a cane, walker or w/c? Yes  Grab bars in the bathroom? Yes  Shower chair or bench in shower? Yes  Elevated toilet seat or a handicapped toilet? No   TIMED UP AND GO:  Was the test performed? No .      Cognitive Function: MMSE - Mini Mental State Exam 06/21/2017 05/20/2016  Not completed: (No Data) (No Data)     6CIT Screen 01/19/2021 12/05/2019  What Year? 0 points 0 points  What month? 0 points 0 points  What time? 0 points -  Count back from 20 4 points 0 points  Months in reverse 4 points 4 points  Repeat phrase 2 points 2 points  Total Score 10 -    Immunizations Immunization History  Administered Date(s) Administered   Fluad Quad(high Dose 65+) 11/20/2018, 12/08/2020   Influenza, High Dose Seasonal PF 11/24/2016, 12/08/2017   Influenza-Unspecified 12/07/2015, 11/09/2019   Moderna SARS-COV2 Booster Vaccination 02/28/2020   Moderna Sars-Covid-2 Vaccination 03/24/2019, 04/28/2019, 07/02/2020   Pneumococcal Conjugate-13 11/13/2013, 11/21/2014, 12/15/2018   Pneumococcal Polysaccharide-23 12/15/2012, 05/20/2016   Zoster Recombinat (Shingrix) 12/21/2017, 12/15/2018   Zoster, Live 03/01/2014    TDAP status:  Due, Education has been provided regarding the importance of this vaccine. Advised may receive this vaccine at local pharmacy or Health Dept. Aware to provide a copy of the vaccination record if obtained from local pharmacy or Health Dept. Verbalized acceptance and understanding.  Flu Vaccine status: Up to date  Pneumococcal vaccine status: Up to date  Covid-19 vaccine status: Completed vaccines  Qualifies for Shingles Vaccine? Yes   Zostavax completed Yes   Shingrix Completed?: Yes  Screening Tests Health Maintenance  Topic Date Due   COLONOSCOPY (Pts 45-72yrs Insurance coverage will need to be confirmed)  Never done   COVID-19 Vaccine (4 - Booster for Moderna series) 08/27/2020   Pneumonia Vaccine 76+ Years old  Completed   INFLUENZA VACCINE  Completed   DEXA SCAN  Completed   Hepatitis C Screening  Completed   Zoster Vaccines- Shingrix  Completed   HPV VACCINES  Aged Out    Health Maintenance  Health Maintenance Due  Topic Date Due   COLONOSCOPY (Pts 45-20yrs Insurance coverage will need to be confirmed)  Never done   COVID-19 Vaccine (4 - Booster for Moderna series) 08/27/2020    Colorectal cancer screening: Not sure  Mammogram status: Completed  01/16/2021. Repeat every year  Bone Density status: Completed 01/14/2021.   Lung Cancer Screening: (Low Dose CT Chest recommended if Age 15-80 years, 30 pack-year currently smoking OR have quit w/in 15years.) does not qualify.   Lung Cancer Screening Referral: no  Additional Screening:  Hepatitis C Screening: does qualify; Completed 01/04/2018  Vision Screening: Recommended annual ophthalmology exams for early detection of glaucoma and other disorders of the eye. Is the patient up to date with their annual eye exam?  No  Who is the provider or what is the name of the office in which the patient attends annual eye exams? none If pt is not established with a provider, would they like to be referred to a provider to establish  care? No .   Dental Screening: Recommended annual dental exams for proper oral hygiene  Community Resource Referral / Chronic Care Management: CRR required this visit?  No   CCM required this visit?  No      Plan:     I have personally reviewed and noted the following in the patient's chart:   Medical and social history Use of alcohol, tobacco or illicit drugs  Current medications and supplements including opioid prescriptions.  Functional ability and status Nutritional status Physical activity Advanced directives List of other physicians Hospitalizations, surgeries, and ER visits in previous 12 months Vitals Screenings to include cognitive, depression, and falls Referrals and appointments  In addition, I have reviewed and discussed with patient certain preventive protocols, quality metrics, and best practice recommendations. A written personalized care plan for preventive services as well as general preventive health recommendations were provided to patient.     Kellie Simmering, LPN   09/32/6712   Nurse Notes: none

## 2021-01-27 ENCOUNTER — Other Ambulatory Visit: Payer: Self-pay

## 2021-01-27 ENCOUNTER — Encounter: Payer: Self-pay | Admitting: Physical Medicine & Rehabilitation

## 2021-01-27 ENCOUNTER — Encounter: Payer: Medicare Other | Attending: Physical Medicine & Rehabilitation | Admitting: Physical Medicine & Rehabilitation

## 2021-01-27 ENCOUNTER — Telehealth: Payer: Self-pay | Admitting: Pharmacist

## 2021-01-27 VITALS — BP 127/85 | HR 71 | Temp 98.4°F | Ht 67.0 in | Wt 252.0 lb

## 2021-01-27 DIAGNOSIS — M1712 Unilateral primary osteoarthritis, left knee: Secondary | ICD-10-CM | POA: Insufficient documentation

## 2021-01-27 NOTE — Progress Notes (Signed)
    Chronic Care Management Pharmacy Assistant   Name: Marliyah Reid  MRN: [id number] DOB: January 11, 1946  Reason for Encounter: Call to [employer] for follow up of Farxiga and Symbicort patient assistance.  ID for Symbicort is [id number] I spoke with Joe at [employer], Symbicort was shipped via [employer] and delivered on 01/09/2021, tracking # [id number], picture of package on the front porch also on file with [employer]. Wilder Glade has been approved, it is awaiting shipping in [facility]'s pharmacy, it should ship any time now.    Care Gaps: AWV - scheduled for 01/19/21 Colonoscopy - never done Covid-19 vaccine - overdue  Last BP - 130/80 on 12/24/20  Star Rating Drugs: Farxiga 10mg  - last filled on 01/13/2021 30DS at [facility] Rosuvastatin 40mg  - last filled on 10/30/20 90DS at [facility] Entresto 49-51mg  - last filled on 12/14/19 90DS at [facility] [phone]

## 2021-01-27 NOTE — Progress Notes (Signed)
Indication end-stage osteoarthritis of the knee with pain that limits mobility and does not respond to oral medications.  Ultrasound guidance, 12 Hz linear transducer, long axis view  Medial aspect of the knee was imaged, identified joint space, identified patella, femur, tibia. 25-gauge 1.5 inch needle was inserted under ultrasound guidance and 3 mL of 1% lidocaine were infiltrated into the skin and subcutaneous tissue. Then a 21-gauge, 2 inch needle was inserted along the same needle track Into the joint under direct ultrasound visualization. . 6 mL of Synvisc-1 were injected. Patient tolerated procedure well Post procedure instructions given

## 2021-01-27 NOTE — Patient Instructions (Signed)
Hylan G-F 20 intra-articular injection What is this medication? HYLAN G-F 20 (HI lan G F 20) is used to treat osteoarthritis of the knee. It lubricates and cushions the joint, reducing pain in the knee. This medicine may be used for other purposes; ask your health care provider or pharmacist if you have questions. COMMON BRAND NAME(S): Synvisc, Synvisc-One What should I tell my care team before I take this medication? They need to know if you have any of these conditions: severe knee inflammation skin conditions or sensitivity skin or joint infection venous stasis an unusual or allergic reaction to hylan G-F 20, hyaluronan (sodium hyaluronate), eggs, other medicines, foods, dyes, or preservatives pregnant or trying to get pregnant breast-feeding How should I use this medication? This medicine is for injection into the knee joint. It is given by a health care professional in a hospital or clinic setting. Talk to your pediatrician regarding the use of this medicine in children. This medicine is not approved for use in children. Overdosage: If you think you have taken too much of this medicine contact a poison control center or emergency room at once. NOTE: This medicine is only for you. Do not share this medicine with others. What if I miss a dose? Keep appointments for follow-up doses as directed. For Synvisc, you will need weekly injections for 3 doses. It is important not to miss your dose. If you will receive Synvisc-One, then only 1 injection will be needed. Call your doctor or health care professional if you are unable to keep an appointment. What may interact with this medication? Do not take this medicine with any of the following medications: other injections for the joint like steroids or anesthetics certain skin disinfectants like benzalkonium chloride This list may not describe all possible interactions. Give your health care provider a list of all the medicines, herbs,  non-prescription drugs, or dietary supplements you use. Also tell them if you smoke, drink alcohol, or use illegal drugs. Some items may interact with your medicine. What should I watch for while using this medication? Tell your doctor or healthcare professional if your symptoms do not start to get better or if they get worse. Your condition will be monitored carefully while you are receiving this medicine. Most persons get pain relief for up to 6 months after treatment. Avoid strenuous activities (high-impact sports, jogging) or major weight-bearing activities for 48 hours after the injection. What side effects may I notice from receiving this medication? Side effects that you should report to your doctor or health care professional as soon as possible: allergic reactions like skin rash, itching or hives, swelling of the face, lips, or tongue difficulty breathing fever or chills severe joint pain or swelling unusual bleeding or bruising Side effects that usually do not require medical attention (report to your doctor or health care professional if they continue or are bothersome): dizziness flushing general ill feeling or flu-like symptoms headache minor joint pain or swelling muscle pain or cramps pain, redness, irritation or bruising at site of injection This list may not describe all possible side effects. Call your doctor for medical advice about side effects. You may report side effects to FDA at 1-800-FDA-1088. Where should I keep my medication? This drug is given in a hospital or clinic and will not be stored at home. NOTE: This sheet is a summary. It may not cover all possible information. If you have questions about this medicine, talk to your doctor, pharmacist, or health care provider.  2022  Elsevier/Gold Standard (2015-03-20 00:00:00)

## 2021-01-28 ENCOUNTER — Ambulatory Visit (HOSPITAL_COMMUNITY): Payer: Medicare Other

## 2021-01-28 ENCOUNTER — Encounter: Payer: Self-pay | Admitting: Nurse Practitioner

## 2021-01-28 DIAGNOSIS — I10 Essential (primary) hypertension: Secondary | ICD-10-CM

## 2021-01-28 DIAGNOSIS — I5022 Chronic systolic (congestive) heart failure: Secondary | ICD-10-CM

## 2021-01-28 DIAGNOSIS — E782 Mixed hyperlipidemia: Secondary | ICD-10-CM | POA: Diagnosis not present

## 2021-01-28 DIAGNOSIS — I480 Paroxysmal atrial fibrillation: Secondary | ICD-10-CM | POA: Diagnosis not present

## 2021-01-28 DIAGNOSIS — M159 Polyosteoarthritis, unspecified: Secondary | ICD-10-CM

## 2021-01-28 NOTE — Telephone Encounter (Signed)
Patient called needing patient assistance or samples of Eliquis.  She says she called the pharmacy and they told her the card does not cover anymore medication.

## 2021-01-29 NOTE — Chronic Care Management (AMB) (Signed)
Called patient and LM that I have contacted [employer] (pharmaceutical company) Let patient know Symbicort was shipped and delivered on 01/09/2021 and Wilder Glade has been approved and to be expecting a call from [employer] for verification of her address and aprox. Shipping date.

## 2021-02-02 DIAGNOSIS — M7989 Other specified soft tissue disorders: Secondary | ICD-10-CM | POA: Diagnosis not present

## 2021-02-02 DIAGNOSIS — I83813 Varicose veins of bilateral lower extremities with pain: Secondary | ICD-10-CM | POA: Diagnosis not present

## 2021-02-03 ENCOUNTER — Encounter: Payer: Self-pay | Admitting: Nurse Practitioner

## 2021-02-06 ENCOUNTER — Ambulatory Visit: Payer: Medicare Other | Admitting: Nurse Practitioner

## 2021-02-06 ENCOUNTER — Encounter: Payer: Self-pay | Admitting: Nurse Practitioner

## 2021-02-06 ENCOUNTER — Other Ambulatory Visit (INDEPENDENT_AMBULATORY_CARE_PROVIDER_SITE_OTHER): Payer: Medicare Other

## 2021-02-06 VITALS — BP 126/70 | HR 60 | Ht 67.0 in | Wt 252.0 lb

## 2021-02-06 DIAGNOSIS — D649 Anemia, unspecified: Secondary | ICD-10-CM

## 2021-02-06 DIAGNOSIS — I509 Heart failure, unspecified: Secondary | ICD-10-CM

## 2021-02-06 DIAGNOSIS — Z1211 Encounter for screening for malignant neoplasm of colon: Secondary | ICD-10-CM

## 2021-02-06 DIAGNOSIS — Z7901 Long term (current) use of anticoagulants: Secondary | ICD-10-CM

## 2021-02-06 LAB — IBC + FERRITIN
Ferritin: 116.9 ng/mL (ref 10.0–291.0)
Iron: 74 ug/dL (ref 42–145)
Saturation Ratios: 25.7 % (ref 20.0–50.0)
TIBC: 288.4 ug/dL (ref 250.0–450.0)
Transferrin: 206 mg/dL — ABNORMAL LOW (ref 212.0–360.0)

## 2021-02-06 NOTE — Progress Notes (Signed)
ASSESSMENT AND PLAN     # 75 yo female here for a colon cancer screening. Patient is at increased risk for procedures given advanced age, multiple medical problems not limited to Afib on chronic anticoagulation, hx of CVA x 2, and CHF. LVEF improved from 30% to 45-50% in September 2021. On Entresto. She is scheduled for follow up echocardiogram next Tuesday.  --Await Echocardiogram results.  --Consider Cologuard. She has no [facility] of colon cancer. No GI bleeding. The only concerns with doing a Cologuard  are a minor but now resolved bowel change and also that she had new anemia at time of last labs in late September 2022 ( hgb 10.9).  --Repeat CBC , check iron studies --Follow up here in a couple of week.  At that time we can review labs and echocardiogram and further discuss risks/ benefits of colonoscopy.   # Chronic constipation. Takes stool softeners. Recently became acutely constipated with associated bloating. Symptoms resolved with Linzess which she is now taking as needed.   # PAF, on Eliquis  # New normocytic anemia. Hgb 10.9 , down from baseline of around 12.  --repeat CBC, check iron studies.   HISTORY OF PRESENT ILLNESS     Chief Complaint : none. Here for colon cancer screening  Danielle [name] is a 75 y.o. female with a past medical history significant for CKD. Chronic systolic heart failure., HTN, HLD, aortic valvular stenosis s/p AFR, PAF on Eliquis, history of CVA x 2, hyperthyroidism, COPD, left breast cancer s/p radiation, osteoporosis, obesity. See PMH below for any additional history.   Patient referred by PCP for colon cancer screening. Patient gives a history of chronic constipation. She takes two stool softeners daily. Recently developed worsening constipation and bloating. Symptoms relieved with Linzess which she is taking as needed ( twice a week). No blood in stool. No [place] colon cancer. Patient's last colonoscopy was in [place] > 10 years ago and  reportedly normal.    PREVIOUS GI EVALUATIONS:   Remote " normal " colonoscopy in [place]    Past Medical History:  Diagnosis Date   Anticoagulated on Coumadin    managed by cardiology   Anxiety    Chronic constipation    Chronic systolic CHF (congestive heart failure) ([place]) 2016   cardiologist--- dr end and followed by CHF clinic   CKD (chronic kidney disease), stage III ([place])    COPD (chronic obstructive pulmonary disease) ([place])    previous had seen pulmonology --- dr Melvyn Novas, note in epic 03-14-2016, dx COPD GOLD 0   Coronary artery disease    cardiologist--- dr Durward Mallard;  last cardiac cath 01-10-20202 mild nonobstructive disease proxLAD;  per pt, had LHC prior to AVR in [place] 2016   DOE (dyspnea on exertion)    GERD (gastroesophageal reflux disease)    History of 2019 novel coronavirus disease (COVID-19) 12/19/2019   hospital admission w/ covid pneumonia , no intubatiion,  per pt symptoms resolved and back to baseline   History of cerebrovascular accident (CVA) with residual deficit    CVA in 2000 without residuals;  CVA post op AVR 01/ 2016 with residual right hand weakness   History of gastritis    History of pneumothorax    s/p right vats 11-19-2014 and  s/p left vats  12-13-2015   (both spontenaous due to bleb)   Hyperlipidemia    Hypertension    Hyperthyroidism    per pt followed by pcp,  dx approx 2014   Malignant  neoplasm of upper-outer quadrant of left breast in female, estrogen receptor positive ([place]) 11/2016   oncologist-- dr Burr Medico--- dx 10/ 2018  ,  s/p left lumptectomy with node dissection;  completed radiation 04-15-2017, no chemo   Multiple thyroid nodules    in care everywhere pt had left thyroid nodule biospy 09-18-2014 benign   NICM (nonischemic cardiomyopathy) ([place]) 2016   2016  ef 30%;  last echo in epic ef 45%   OA (osteoarthritis)    Osteoporosis    Paroxysmal atrial fibrillation [facility])    cardiologist--- dr end   Personal history of radiation therapy     left breast cancer  03-08-2017  to 04-05-2017   Thrombocytopenia [facility])    followed by dr Burr Medico   Valvular stenosis, aortic 03/12/2014   Status post bioprosthetic AVR  in [place]  for severe AS   Wears dentures    full upper     Past Surgical History:  Procedure Laterality Date   AORTIC VALVE REPLACEMENT  03/12/2014   [facility] [place];  St Jude 39mm, medial Trifecta Bioprosthesis   BREAST LUMPECTOMY WITH RADIOACTIVE SEED AND SENTINEL LYMPH NODE BIOPSY Left 01/14/2017   Procedure: LEFT BREAST LUMPECTOMY WITH RADIOACTIVE SEED AND SENTINEL LYMPH NODE BIOPSY;  Surgeon: Rolm Bookbinder, MD;  Location: [facility];  Service: General;  Laterality: Left;   CARDIAC CATHETERIZATION  02/12/2014   [facility] in [place] D & C N/A 11/17/2020   Procedure: DILATATION AND CURETTAGE /HYSTEROSCOPY WITH MYOSURE;  Surgeon: Nunzio Cobbs, MD;  Location: [facility];  Service: Gynecology;  Laterality: N/A;   INTRAMEDULLARY (IM) NAIL INTERTROCHANTERIC Left 12/10/2015   Procedure: INTRAMEDULLARY (IM) NAIL INTERTROCHANTRIC;  Surgeon: Leandrew Koyanagi, MD;  Location: [place];  Service: Orthopedics;  Laterality: Left;   OPERATIVE ULTRASOUND N/A 11/17/2020   Procedure: OPERATIVE ULTRASOUND;  Surgeon: Nunzio Cobbs, MD;  Location: [facility];  Service: Gynecology;  Laterality: N/A;   PLEURADESIS Left 12/03/2015   Procedure: PLEURADESIS;  Surgeon: Melrose Nakayama, MD;  Location: [place];  Service: Thoracic;  Laterality: Left;   RESECTION OF APICAL BLEB Left 12/03/2015   Procedure: BLEBECTOMY;  Surgeon: Melrose Nakayama, MD;  Location: [place];  Service: Thoracic;  Laterality: Left;   RIGHT/LEFT HEART CATH AND CORONARY ANGIOGRAPHY N/A 03/10/2020   Procedure: RIGHT/LEFT HEART CATH AND CORONARY ANGIOGRAPHY;  Surgeon: [name], MD;  Location: [place] [facility];  Service: Cardiovascular;  Laterality: N/A;   VIDEO ASSISTED  THORACOSCOPY Left 12/03/2015   Procedure: VIDEO ASSISTED THORACOSCOPY;  Surgeon: Melrose Nakayama, MD;  [facility];  Service: Thoracic;  Laterality: Left;   VIDEO ASSISTED THORACOSCOPY (VATS) W/TALC PLEUADESIS Right 11/19/2014   in [place]   Family History  Problem Relation Age of Onset   Diabetes Mother    Heart attack Mother 68   Diabetes Father    Lung cancer Father    Diabetes Sister    Thyroid disease Sister    Diabetes Sister    HIV Brother    Colon polyps Neg Hx    Colon cancer Neg Hx    Social History   Tobacco Use   Smoking status: Former    Packs/day: 0.25    Years: 10.00    Pack years: 2.50    Types: Cigarettes    Quit date: 2012    Years since quitting: 10.9   Smokeless tobacco: Never  Vaping Use  Vaping Use: Never used  Substance Use Topics   Alcohol use: No   Drug use: Never   Current Outpatient Medications  Medication Sig Dispense Refill   albuterol (PROVENTIL HFA;VENTOLIN HFA) 108 (90 Base) MCG/ACT inhaler Inhale 2 puffs into the lungs every 6 (six) hours as needed for wheezing or shortness of breath.     alendronate (FOSAMAX) 70 MG tablet Take 1 tablet (70 mg total) by mouth once a week. Take with a full glass of water on an empty stomach. 13 tablet 3   apixaban (ELIQUIS) 5 MG TABS tablet Take 1 tablet (5 mg total) by mouth 2 (two) times daily. 60 tablet 5   ascorbic acid (VITAMIN C) 500 MG tablet Take 500 mg by mouth daily.     budesonide-formoterol (SYMBICORT) 160-4.5 MCG/ACT inhaler Inhale 2 puffs into the lungs daily as needed. 3 each 3   carvedilol (COREG) 12.5 MG tablet TAKE 1 TABLET BY MOUTH  TWICE DAILY WITH A MEAL 180 tablet 0   Cholecalciferol 25 MCG (1000 UT) tablet Take 1,000 Units by mouth daily.     dapagliflozin propanediol (FARXIGA) 10 MG TABS tablet Take 1 tablet (10 mg total) by mouth daily before breakfast. 30 tablet 11   DULoxetine (CYMBALTA) 30 MG capsule Take 1 capsule (30 mg total) by mouth daily. 30 capsule 1    fluticasone (FLONASE) 50 MCG/ACT nasal spray Place 1 spray into both nostrils daily. 16 g 3   methimazole (TAPAZOLE) 5 MG tablet Take 3 tablets (15 mg total) by mouth daily. 270 tablet 2   mirtazapine (REMERON) 15 MG tablet Take 1 tablet (15 mg total) by mouth at bedtime. 30 tablet 0   pantoprazole (PROTONIX) 40 MG tablet Take 1 tablet (40 mg total) by mouth daily. 30 tablet 11   rosuvastatin (CRESTOR) 40 MG tablet TAKE 1 TABLET BY MOUTH  DAILY 90 tablet 3   sacubitril-valsartan (ENTRESTO) 49-51 MG Take 1 tablet by mouth 2 (two) times daily. 180 tablet 0   senna-docusate (SENOKOT-S) 8.6-50 MG tablet Take 2 tablets by mouth at bedtime.     spironolactone (ALDACTONE) 25 MG tablet Take 1 tablet (25 mg total) by mouth daily. 90 tablet 3   terconazole (TERAZOL 7) 0.4 % vaginal cream Place 1 applicator vaginally at bedtime. 45 g 0   torsemide (DEMADEX) 20 MG tablet Take 40mg  daily alternating with 20mg  every other day 90 tablet 3   traMADol (ULTRAM) 50 MG tablet TAKE 1 TABLET BY MOUTH THREE TIMES DAILY 90 tablet 5   trolamine salicylate (ASPERCREME) 10 % cream Apply 1 application topically as needed for muscle pain.     valACYclovir (VALTREX) 500 MG tablet Take on tablet by mouth twice a day for 3 days as needed for an outbreak 30 tablet 0   linaclotide (LINZESS) 145 MCG CAPS capsule Take 1 capsule (145 mcg total) by mouth daily before breakfast. (Patient not taking: Reported on 02/06/2021) 30 capsule 1   No current facility-administered medications for this visit.   Allergies  Allergen Reactions   Lisinopril Cough   Tetanus Toxoid Adsorbed Swelling    Arm swelling   Other Cough    PEANUTS     Review of Systems: All systems reviewed and negative except where noted in HPI.    PHYSICAL EXAM :    Wt Readings from Last 3 Encounters:  02/06/21 252 lb (114.3 kg)  01/27/21 252 lb (114.3 kg)  01/19/21 251 lb (113.9 kg)    BP 126/70   Pulse  60   Ht 5\' 7"  (1.702 m)   Wt 252 lb (114.3 kg)    BMI 39.47 kg/m  Constitutional:  female in no acute distress in wheelchair ( walks with walker) Psychiatric: Pleasant. Normal mood and affect. Behavior is normal. EENT: Pupils normal.  Conjunctivae are normal. No scleral icterus. Neck supple.  Cardiovascular: Normal rate, regular rhythm, 1-2+ BLE  Pulmonary/chest: Effort normal and breath sounds normal. No wheezing, rales or rhonchi. Abdominal: Limited exam in wheelchair. Abdomina is soft, nondistended, nontender. Bowel sounds active throughout.  Neurological: Alert and oriented to person place and time. Skin: Skin is warm and dry. No rashes noted.  Tye Savoy, NP  02/06/2021, 10:39 AM  Cc:  Referring Provider [name], [name], MD

## 2021-02-06 NOTE — Patient Instructions (Addendum)
Your provider has requested that you go to the basement level for lab work before leaving today. Press "B" on the elevator. The lab is located at the first door on the left as you exit the elevator.  Follow up in 4 weeks to discuss colonoscopy.  If you are age 75 or older, your body mass index should be between 23-30. Your Body mass index is 39.47 kg/m. If this is out of the aforementioned range listed, please consider follow up with your Primary Care Provider.  If you are age 55 or younger, your body mass index should be between 19-25. Your Body mass index is 39.47 kg/m. If this is out of the aformentioned range listed, please consider follow up with your Primary Care Provider.   ________________________________________________________  The [facility] providers would like to encourage you to use [facility] to communicate with providers for non-urgent requests or questions.  Due to long hold times on the telephone, sending your provider a message by [facility] may be a faster and more efficient way to get a response.  Please allow 48 business hours for a response.  Please remember that this is for non-urgent requests.  _______________________________________________________

## 2021-02-08 NOTE — Progress Notes (Signed)
Attending Physician's Attestation   I have reviewed the chart.   I agree with the Advanced Practitioner's note, impression, and recommendations with any updates as below. Review of labs is reasonable.  Hopefully no evidence of iron deficiency.  Progressive anemia and/or iron deficiency is found will need to consider diagnostic endoscopy and colonoscopy.  If anemia has improved and there is no evidence of iron deficiency, then Cologuard testing or FIT based testing may be reasonable.   Justice Britain, MD [facility] Advanced Endoscopy Office # [phone]

## 2021-02-09 ENCOUNTER — Other Ambulatory Visit: Payer: Self-pay | Admitting: Family Medicine

## 2021-02-09 DIAGNOSIS — E0590 Thyrotoxicosis, unspecified without thyrotoxic crisis or storm: Secondary | ICD-10-CM

## 2021-02-10 ENCOUNTER — Other Ambulatory Visit: Payer: Self-pay

## 2021-02-10 ENCOUNTER — Ambulatory Visit (HOSPITAL_COMMUNITY)
Admission: RE | Admit: 2021-02-10 | Discharge: 2021-02-10 | Disposition: A | Discharge status: Home / Self Care | Payer: Medicare Other | Source: Ambulatory Visit | Attending: Family Medicine | Admitting: Family Medicine

## 2021-02-10 ENCOUNTER — Telehealth: Payer: Self-pay | Admitting: Internal Medicine

## 2021-02-10 ENCOUNTER — Encounter (HOSPITAL_COMMUNITY): Payer: Self-pay | Admitting: Cardiology

## 2021-02-10 ENCOUNTER — Ambulatory Visit (HOSPITAL_BASED_OUTPATIENT_CLINIC_OR_DEPARTMENT_OTHER)
Admission: RE | Admit: 2021-02-10 | Discharge: 2021-02-10 | Disposition: A | Discharge status: Home / Self Care | Payer: Medicare Other | Source: Ambulatory Visit | Attending: Cardiology | Admitting: Cardiology

## 2021-02-10 ENCOUNTER — Telehealth: Payer: Self-pay | Admitting: Student-PharmD

## 2021-02-10 VITALS — BP 110/70 | HR 72 | Wt 251.0 lb

## 2021-02-10 DIAGNOSIS — I700 Atherosclerosis of aorta: Secondary | ICD-10-CM | POA: Diagnosis not present

## 2021-02-10 DIAGNOSIS — I252 Old myocardial infarction: Secondary | ICD-10-CM | POA: Diagnosis not present

## 2021-02-10 DIAGNOSIS — R062 Wheezing: Secondary | ICD-10-CM | POA: Insufficient documentation

## 2021-02-10 DIAGNOSIS — Z6839 Body mass index (BMI) 39.0-39.9, adult: Secondary | ICD-10-CM | POA: Diagnosis not present

## 2021-02-10 DIAGNOSIS — E669 Obesity, unspecified: Secondary | ICD-10-CM | POA: Diagnosis not present

## 2021-02-10 DIAGNOSIS — I2510 Atherosclerotic heart disease of native coronary artery without angina pectoris: Secondary | ICD-10-CM | POA: Insufficient documentation

## 2021-02-10 DIAGNOSIS — Z86718 Personal history of other venous thrombosis and embolism: Secondary | ICD-10-CM | POA: Diagnosis not present

## 2021-02-10 DIAGNOSIS — Z953 Presence of xenogenic heart valve: Secondary | ICD-10-CM | POA: Insufficient documentation

## 2021-02-10 DIAGNOSIS — Z7984 Long term (current) use of oral hypoglycemic drugs: Secondary | ICD-10-CM | POA: Diagnosis not present

## 2021-02-10 DIAGNOSIS — Z7901 Long term (current) use of anticoagulants: Secondary | ICD-10-CM | POA: Insufficient documentation

## 2021-02-10 DIAGNOSIS — I351 Nonrheumatic aortic (valve) insufficiency: Secondary | ICD-10-CM | POA: Insufficient documentation

## 2021-02-10 DIAGNOSIS — I428 Other cardiomyopathies: Secondary | ICD-10-CM | POA: Diagnosis not present

## 2021-02-10 DIAGNOSIS — R0602 Shortness of breath: Secondary | ICD-10-CM | POA: Diagnosis not present

## 2021-02-10 DIAGNOSIS — Z87891 Personal history of nicotine dependence: Secondary | ICD-10-CM | POA: Diagnosis not present

## 2021-02-10 DIAGNOSIS — M25569 Pain in unspecified knee: Secondary | ICD-10-CM | POA: Insufficient documentation

## 2021-02-10 DIAGNOSIS — I480 Paroxysmal atrial fibrillation: Secondary | ICD-10-CM

## 2021-02-10 DIAGNOSIS — E0590 Thyrotoxicosis, unspecified without thyrotoxic crisis or storm: Secondary | ICD-10-CM | POA: Insufficient documentation

## 2021-02-10 DIAGNOSIS — I5022 Chronic systolic (congestive) heart failure: Secondary | ICD-10-CM | POA: Diagnosis not present

## 2021-02-10 DIAGNOSIS — Z09 Encounter for follow-up examination after completed treatment for conditions other than malignant neoplasm: Secondary | ICD-10-CM | POA: Insufficient documentation

## 2021-02-10 DIAGNOSIS — R002 Palpitations: Secondary | ICD-10-CM | POA: Diagnosis not present

## 2021-02-10 DIAGNOSIS — I110 Hypertensive heart disease with heart failure: Secondary | ICD-10-CM | POA: Insufficient documentation

## 2021-02-10 DIAGNOSIS — Z79899 Other long term (current) drug therapy: Secondary | ICD-10-CM | POA: Diagnosis not present

## 2021-02-10 LAB — BASIC METABOLIC PANEL
Anion gap: 10 (ref 5–15)
BUN: 13 mg/dL (ref 8–23)
CO2: 22 mmol/L (ref 22–32)
Calcium: 10.2 mg/dL (ref 8.9–10.3)
Chloride: 106 mmol/L (ref 98–111)
Creatinine, Ser: 1.71 mg/dL — ABNORMAL HIGH (ref 0.44–1.00)
GFR, Estimated: 31 mL/min — ABNORMAL LOW (ref >60)
Glucose, Bld: 93 mg/dL (ref 70–99)
Potassium: 4.2 mmol/L (ref 3.5–5.1)
Sodium: 138 mmol/L (ref 135–145)

## 2021-02-10 LAB — LIPID PANEL
Cholesterol: 142 mg/dL (ref 0–200)
HDL: 63 mg/dL (ref >40)
LDL Cholesterol: 67 mg/dL (ref 0–99)
Total CHOL/HDL Ratio: 2.3 RATIO
Triglycerides: 59 mg/dL (ref <150)
VLDL: 12 mg/dL (ref 0–40)

## 2021-02-10 LAB — ECHOCARDIOGRAM COMPLETE
AR max vel: 0.79 cm2
AV Area VTI: 0.81 cm2
AV Area mean vel: 0.74 cm2
AV Mean grad: 10 mmHg
AV Peak grad: 18.1 mmHg
Ao pk vel: 2.13 m/s
S' Lateral: 3.5 cm

## 2021-02-10 LAB — BRAIN NATRIURETIC PEPTIDE: B Natriuretic Peptide: 68.5 pg/mL (ref 0.0–100.0)

## 2021-02-10 MED ORDER — CARVEDILOL 12.5 MG PO TABS: 18.7500 mg | ORAL_TABLET | Freq: Two times a day (BID) | ORAL | 3 refills | Status: DC

## 2021-02-10 MED ORDER — POTASSIUM CHLORIDE CRYS ER 20 MEQ PO TBCR: 20.0000 meq | EXTENDED_RELEASE_TABLET | Freq: Two times a day (BID) | ORAL | 4 refills | Status: DC

## 2021-02-10 MED ORDER — TORSEMIDE 20 MG PO TABS: ORAL_TABLET | ORAL | 2 refills | Status: DC

## 2021-02-10 MED ORDER — PERFLUTREN LIPID MICROSPHERE
1.0000 mL | INTRAVENOUS | Status: DC | PRN
Start: 2021-02-10 — End: 2021-02-10
  Administered 2021-02-10: 2 mL via INTRAVENOUS
  Filled 2021-02-10: qty 10

## 2021-02-10 NOTE — Telephone Encounter (Signed)
Patient family member dropped off patient assistance forms to be completed Placed in nurse box

## 2021-02-10 NOTE — Patient Instructions (Addendum)
Medication Changes:  INCREASE Carvedilol to 18.75mg  (1 1/2 TAB) Twice daily  INCREASE Torsemide to 60mg  daily alternating with 40mg   START Potassium 20Meq Daily  Lab Work:  Labs done today, your results will be available in MyChart, we will contact you for abnormal readings.   Testing/Procedures:  Labs in 10days  Referrals:  You have been referred to Fuller [place] at the [facility]. They will call you with appointment   Special Instructions // Education:    Follow-Up in: 1 months  At the [facility], you and your health needs are our priority. We have a designated team specialized in the treatment of Heart Failure. This Care Team includes your primary Heart Failure Specialized Cardiologist (physician), Advanced Practice Providers (APPs- Physician Assistants and Nurse Practitioners), and Pharmacist who all work together to provide you with the care you need, when you need it.   You may see any of the following providers on your designated Care Team at your next follow up:  Dr Glori Bickers Dr Haynes Kerns, NP Lyda Jester, [place] [facility] [place], [place] Audry Riles, PharmD   Please be sure to bring in all your medications bottles to every appointment.   Need to Contact us:  If you have any questions or concerns before your next appointment please send [place] a message through [place] or call our office at [phone].    TO LEAVE A MESSAGE FOR THE NURSE SELECT OPTION 2, PLEASE LEAVE A MESSAGE INCLUDING: YOUR NAME DATE OF BIRTH CALL BACK NUMBER REASON FOR CALL**this is important as we prioritize the call backs  YOU WILL RECEIVE A CALL BACK THE SAME DAY AS LONG AS YOU CALL BEFORE 4:00 PM

## 2021-02-10 NOTE — Progress Notes (Signed)
ReDS Vest / Clip - 02/10/21 0900       ReDS Vest / Clip   Station Marker C    Ruler Value 32    ReDS Value Range High volume overload    ReDS Actual Value 43

## 2021-02-10 NOTE — Telephone Encounter (Signed)
Received referral from Dr. Aundra Dubin to start semaglutide for this patient for weight loss. They meet FDA approved criteria for semaglutide for use in obesity given BMI 39.31. Obesity is complicated by chronic conditions including HTN, HF, CAD, HLD.   Patient's Medicare plan does not cover Wegovy but does cover Ozempic for diabetes for $45/month. Patient is already on 4 branded medications, all of which she receives through patient assistance. Ozempic has a patient assistance program but this is for patients with diabetes. The patient does not have diabetes with most recent A1c 5.1 11/2020 so she does not meet criteria for assistance.    Called patient to discuss but unable to reach. LVM requesting call back.

## 2021-02-10 NOTE — Progress Notes (Signed)
PCP: [name], [name], MD Cardiology: Dr. Saunders Revel HF cardiology: Dr. Aundra Dubin  75 y.o. with history of aortic insufficiency s/p bioprosthetic aortic valve and CHF was referred by Dr End for evaluation of CHF.  Patient had bioprosthetic aortic valve replacement for aortic insufficiency in 1/16 at [facility] in [place], [place].  Since that time, she has had mild to moderately reduced LV systolic function, as low as 30-35% and as high at 45-50%.  Most recent echo in 9/21 showed EF 45-50%.  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  She has had paroxysmal atrial fibrillation and 2 prior CVAs.  She is on warfarin.  She has had breast cancer treated with lumpectomy and radiation, no chemotherapy.  She has history of hyperthyroidism and is on methimazole.  She has controlled HTN.    Cardiac MRI (10/22) with LV EF 41%, normal RV, subendocardial LGE in the mid-apical inferior wall and the mid-apical inferolateral wall.   Echo was done today and reviewed, very technically difficult study with poor images, EF around 35%, RV poorly visualized, the bioprosthetic aortic valve looked ok.    She returns for followup of CHF.  Patient walks with walker because of knee pain.  She is short of breath walking up any stairs or walking longer distances.  OK walking in house.  She is wheezing today.  No chest pain.  She has chronic 2 pillow orthopnea.   Weight is actually down, she has been working on her diet.    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 Left breast cancer: Treated with lumpectomy and radiation, no chemotherapy.  2. Hyperthyroidism 3. Aortic insufficiency: Severe, found when she lived in [place].  She had bioprosthetic aortic valve replacement at [facility] in 1/16.  4. Atrial fibrillation: Paroxysmal. 5.  CVA x 2 6. HTN 7. Hyperlipidemia 8. Chronic systolic CHF: Nonischemic cardiomyopathy.  - Echo (3/16): EF 45-50% - Echo (1/17): EF 30-35% - Echo (2/18): EF 35-40% - Echo (11/20): EF 35-40% - Echo (9/21): EF 45-50%, global hypokinesis, mild LVH, bioprosthetic aortic valve mean gradient 10 mmHg, normal RV - RHC/LHC (1/22): mean RA 9, PA 35/20, mean PCWP 21, CI 3.2.  Nonobstructive CAD.  - cMRI (10/22): LV EF 41%, normal RV, subendocardial LGE in the mid-apical inferior wall and the mid-apical inferolateral wall. - Echo (12/22): Very technically difficult study with poor images, EF around 35%, RV poorly visualized, the bioprosthetic aortic valve looked ok. 9. H/o spontaneous PTX  Social History   Socioeconomic History   Marital status: Married    Spouse name: Sherwood   Number of children: 0   Years of education: 12   Highest education level: 12th grade  Occupational History   Occupation: Retired in 2004  Tobacco Use   Smoking status: Former    Packs/day: 0.25    Years: 10.00    Pack years: 2.50    Types: Cigarettes    Quit date: 2012    Years since quitting: 10.9   Smokeless tobacco: Never  Vaping Use   Vaping Use: Never used  Substance and Sexual Activity   Alcohol use: No   Drug use: Never   Sexual activity: Yes    Birth control/protection: Post-menopausal  Other Topics Concern   Not on file  Social History Narrative   Lives with husband.  Ambulated independently.   Right-handed.   No daily caffeine use.   Social Determinants of Health   Financial Resource Strain: Medium Risk   Difficulty of Paying Living Expenses: Somewhat hard  Food Insecurity: No Food Insecurity   Worried About Charity fundraiser in the Last Year: Never true   Ran Out of Food in the Last Year: Never true  Transportation Needs: No Transportation Needs   Lack of Transportation (Medical): No   Lack of Transportation (Non-Medical): No  Physical Activity: Insufficiently Active   Days of  Exercise per Week: 5 days   Minutes of Exercise per Session: 20 min  Stress: No Stress Concern Present   Feeling of Stress : Not at all  Social Connections: Moderately Integrated   Frequency of Communication with Friends and Family: More than three times a week   Frequency of Social Gatherings with Friends and Family: More than three times a week   Attends Religious Services: More than 4 times per year   Active Member of [employer] or Organizations: No   Attends Music therapist: Never   Marital Status: Married  Human resources officer Violence: Not At Risk   Fear of Current or Ex-Partner: No   Emotionally Abused: No   Physically Abused: No   Sexually Abused: No   Family History  Problem Relation Age of Onset   Diabetes Mother    Heart attack Mother 32   Diabetes Father    Lung cancer Father    Diabetes Sister    Thyroid disease Sister    Diabetes Sister    HIV Brother    Colon polyps Neg Hx    Colon cancer Neg Hx    ROS: All systems reviewed and negative except as per HPI.   Current Outpatient Medications  Medication Sig Dispense Refill   albuterol (PROVENTIL HFA;VENTOLIN HFA) 108 (90 Base) MCG/ACT inhaler Inhale 2 puffs into the lungs every 6 (six) hours as needed for wheezing or shortness of breath.     alendronate (FOSAMAX) 70 MG tablet Take 1 tablet (70 mg total) by mouth once a week. Take with a full glass of water on an empty stomach. 13 tablet 3   apixaban (ELIQUIS) 5 MG TABS tablet Take 1 tablet (5 mg total) by mouth 2 (two) times daily. 60 tablet 5   ascorbic acid (VITAMIN C) 500 MG tablet Take 500 mg by mouth daily.     budesonide-formoterol (SYMBICORT) 160-4.5 MCG/ACT inhaler Inhale 2 puffs into the lungs daily as needed. 3 each 3   Cholecalciferol 25 MCG (1000 UT) tablet Take 1,000 Units by mouth daily.     dapagliflozin propanediol (FARXIGA) 10 MG TABS tablet Take 1 tablet (10 mg total) by mouth daily before breakfast. 30 tablet 11   DULoxetine (CYMBALTA) 30 MG  capsule Take 1 capsule (30 mg total) by mouth daily. 30 capsule 1   fluticasone (FLONASE) 50 MCG/ACT nasal spray Place 1 spray into both nostrils daily. 16 g 3   methimazole (TAPAZOLE) 5 MG tablet TAKE 3 TABLETS BY MOUTH  DAILY 270 tablet 3   mirtazapine (REMERON) 15 MG tablet Take 1 tablet (15 mg total) by mouth at bedtime. 30 tablet 0   pantoprazole (PROTONIX) 40 MG tablet Take 1 tablet (40 mg total) by mouth daily. 30 tablet 11   potassium chloride SA (KLOR-CON M) 20 MEQ tablet Take 1 tablet (20 mEq total) by mouth 2 (two) times daily. 30 tablet 4   rosuvastatin (CRESTOR) 40 MG tablet TAKE  1 TABLET BY MOUTH  DAILY 90 tablet 3   sacubitril-valsartan (ENTRESTO) 49-51 MG Take 1 tablet by mouth 2 (two) times daily. 180 tablet 0   senna-docusate (SENOKOT-S) 8.6-50 MG tablet Take 2 tablets by mouth at bedtime.     spironolactone (ALDACTONE) 25 MG tablet Take 1 tablet (25 mg total) by mouth daily. 90 tablet 3   terconazole (TERAZOL 7) 0.4 % vaginal cream Place 1 applicator vaginally at bedtime. 45 g 0   traMADol (ULTRAM) 50 MG tablet TAKE 1 TABLET BY MOUTH THREE TIMES DAILY 90 tablet 5   trolamine salicylate (ASPERCREME) 10 % cream Apply 1 application topically as needed for muscle pain.     valACYclovir (VALTREX) 500 MG tablet Take on tablet by mouth twice a day for 3 days as needed for an outbreak 30 tablet 0   carvedilol (COREG) 12.5 MG tablet Take 1.5 tablets (18.75 mg total) by mouth 2 (two) times daily with a meal. 252 tablet 3   torsemide (DEMADEX) 20 MG tablet Take 60mg  daily alternating with 40mg  every other day 420 tablet 2   No current facility-administered medications for this encounter.   BP 110/70    Pulse 72    Wt 113.9 kg (251 lb)    SpO2 97%    BMI 39.31 kg/m  General: NAD Neck: JVP 8-9 cm, no thyromegaly or thyroid nodule.  Lungs: Occasional wheezes CV: Nondisplaced PMI.  Heart regular S1/S2, no S3/S4, no murmur.  1+ ankle edema.  No carotid bruit.  Normal pedal pulses.   Abdomen: Soft, nontender, no hepatosplenomegaly, no distention.  Skin: Intact without lesions or rashes.  Neurologic: Alert and oriented x 3.  Psych: Normal affect. Extremities: No clubbing or cyanosis.  HEENT: Normal.   Assessment/Plan: 1. Chronic HF with mid range EF: Nonischemic cardiomyopathy.  Echo in 9/21 showed EF 45-50%, global hypokinesis, mild LVH, bioprosthetic aortic valve mean gradient 10 mmHg, normal RV.  Cause of cardiomyopathy is uncertain, mild CAD on 1/22 cath.  It is possible that this is chronic effect of long-standing AI that was not improved with valve replacement.  Also consider long-standing HTN.  Cardiac MRI in 10/22 showed LV EF 41%, normal RV, subendocardial LGE in the mid-apical inferior wall and the mid-apical inferolateral wall. The LGE pattern does not look like cardiac amyloidosis.  The subendocardial LGE looks like it came from prior MI but minimal CAD on cath => ?prior embolism down a coronary related to atrial fibrillation. Echo today was a very poor study, EF looks to be around 35%.  She is limited by knee pain, but does have NYHA class III dyspnea.  She is volume overloaded by exam and REDS clip.   - Increase torsemide to 60 mg daily alternating with 40 mg daily + KCl 20 daily. BMET/BNP today and again in 10 days.   - Continue Farxiga 10 mg daily.  - Can increase Coreg to 18.75 mg bid.  - Continue Entresto 49/51 bid.  - Continue spironolactone 25 mg daily.  - Increase activity.  2. Aortic valve disorders: Per report, had severe AI.  Now s/p bioprosthetic aortic valve replacement.  The valve functions normally on echo done today.  - Will need antibiotic prophylaxis with dental work.  3. HTN: BP controlled on current regimen.  4. Atrial fibrillation: Paroxysmal.  She is in NSR today, rare palpitations.   - Contineu apixaban.   5. Obesity: I will refer to pharmacy clinic for semaglutide.   Followup 1 month with APP.  [name] 02/10/2021

## 2021-02-11 ENCOUNTER — Telehealth (HOSPITAL_COMMUNITY): Payer: Self-pay | Admitting: Cardiology

## 2021-02-11 ENCOUNTER — Other Ambulatory Visit: Payer: Self-pay | Admitting: *Deleted

## 2021-02-11 DIAGNOSIS — I87323 Chronic venous hypertension (idiopathic) with inflammation of bilateral lower extremity: Secondary | ICD-10-CM | POA: Diagnosis not present

## 2021-02-11 DIAGNOSIS — I872 Venous insufficiency (chronic) (peripheral): Secondary | ICD-10-CM | POA: Diagnosis not present

## 2021-02-11 MED ORDER — ENTRESTO 49-51 MG PO TABS: 1.0000 | ORAL_TABLET | Freq: Two times a day (BID) | ORAL | 3 refills | Status: DC

## 2021-02-11 MED ORDER — OZEMPIC (0.25 OR 0.5 MG/DOSE) 2 MG/1.5ML ~~LOC~~ SOPN: PEN_INJECTOR | SUBCUTANEOUS | 1 refills | Status: DC

## 2021-02-11 NOTE — Telephone Encounter (Signed)
Pt returned call to clinic. States she would be ok with $45 copay for Ozempic. Sent rx to her pharmacy to check on copay, not sure if she's in the donut hole since she gets her other branded meds from pt assistance.   Called pharmacy, 1 month copay for Ozempic is $237. Pt states she is Hospital doctor in January. Will try sending in rx in January to see if copay is cheaper. Pt aware of plan.

## 2021-02-11 NOTE — Telephone Encounter (Signed)
Patient left message on triage line requesting a call back to review now dose of coreg  Attempted to return call No answer [facility]

## 2021-02-12 ENCOUNTER — Telehealth: Payer: Self-pay | Admitting: Pharmacist

## 2021-02-12 NOTE — Telephone Encounter (Addendum)
Faxed pt assistance application to [employer] for [employer] @ [phone] and confirmation received via fax.  Called and LDM on pt's husband phone to make aware that we will contact once we receive a response from [employer] PAF. Paperwork placed in [employer].

## 2021-02-12 NOTE — Chronic Care Management (AMB) (Signed)
° ° °  Chronic Care Management Pharmacy Assistant   Name: Danielle [name]  MRN: [id number] DOB: 1945/04/29  Reason for Encounter: [name] assistance for Symbicort and Farxiga.  Spoke with Sharyn Lull at [employer] and both medications have been approved through Dec. 2023 and they do have prescriptions on file. Patient notified.   Care Gaps: AWV - scheduled for 01/19/21 Colonoscopy - never done Covid-19 vaccine - overdue  Last BP - 130/80 on 12/24/20  Star Rating Drugs: Farxiga 10mg  - patient assistance [employer] Rosuvastatin 40mg  - last filled on 10/30/20 90DS at [place] Entresto 49-51mg  - last filled on 12/14/19 90DS at [facility] [phone]

## 2021-02-16 ENCOUNTER — Ambulatory Visit (INDEPENDENT_AMBULATORY_CARE_PROVIDER_SITE_OTHER): Payer: Medicare Other

## 2021-02-16 DIAGNOSIS — I10 Essential (primary) hypertension: Secondary | ICD-10-CM

## 2021-02-16 DIAGNOSIS — M159 Polyosteoarthritis, unspecified: Secondary | ICD-10-CM

## 2021-02-16 DIAGNOSIS — I480 Paroxysmal atrial fibrillation: Secondary | ICD-10-CM

## 2021-02-16 DIAGNOSIS — I5022 Chronic systolic (congestive) heart failure: Secondary | ICD-10-CM

## 2021-02-16 DIAGNOSIS — E782 Mixed hyperlipidemia: Secondary | ICD-10-CM

## 2021-02-16 NOTE — Patient Instructions (Addendum)
Visit Information  Thank you for taking time to visit with me today. Please don't hesitate to contact me if I can be of assistance to you before our next scheduled telephone appointment.  Following are the goals we discussed today:  Take all medications as prescribed Attend all scheduled provider appointments Call pharmacy for medication refills 3-7 days in advance of running out of medications Call provider office for new concerns or questions  call office if I gain more than 2 pounds in one day or 5 pounds in one week keep legs up while sitting track weight in diary use salt in moderation watch for swelling in feet, ankles and legs every day follow rescue plan if symptoms flare-up check blood pressure daily choose a place to take my blood pressure (home, clinic or office, retail store) take blood pressure log to all doctor appointments take medications for blood pressure exactly as prescribed call for medicine refill 2 or 3 days before it runs out take all medications exactly as prescribed call doctor with any symptoms you believe are related to your medicine learn how to meditate - learn relaxation techniques - practice acceptance of chronic pain - practice relaxation or meditation daily - tell myself I can (not I can't) - think of new ways to do favorite things - use distraction techniques - use relaxation during pain  Our next appointment is by telephone on 03/20/21 at 3 PM  Please call the care guide team at [phone] if you need to cancel or reschedule your appointment.   If you are experiencing a Mental Health or [place] or need someone to talk to, please call the Suicide and Crisis Lifeline: 988 call the [place] National Suicide Prevention Lifeline: 531-016-5190 or TTY: 415-396-2212 TTY 704-609-2207) to talk to a trained counselor call 1-800-273-TALK (toll free, 24 hour hotline) go to [facility] [address],  [place] [phone]) call 911   Patient verbalizes understanding of instructions provided today and agrees to view in [place].  Peter Garter RN, Jackquline Denmark, CDE Care Management Coordinator [facility] [phone], Mobile [phone]

## 2021-02-16 NOTE — Chronic Care Management (AMB) (Signed)
Chronic Care Management   CCM RN Visit Note  02/16/2021 Name: Danielle [name] MRN: [id number] DOB: [birth year]  Subjective: Danielle [name] is a 75 y.o. year old female who is a primary care patient of [name], [name], MD. The care management team was consulted for assistance with disease management and care coordination needs.    Engaged with patient by telephone for follow up visit in response to provider referral for case management and/or care coordination services.   Consent to Services:  The patient was given information about Chronic Care Management services, agreed to services, and gave verbal consent prior to initiation of services.  Please see initial visit note for detailed documentation.   Patient agreed to services and verbal consent obtained.   Assessment: Review of patient past medical history, allergies, medications, health status, including review of consultants reports, laboratory and other test data, was performed as part of comprehensive evaluation and provision of chronic care management services.   SDOH (Social Determinants of Health) assessments and interventions performed:    CCM Care Plan  Allergies  Allergen Reactions   Lisinopril Cough   Tetanus Toxoid Adsorbed Swelling    Arm swelling   Other Cough    PEANUTS    Outpatient Encounter Medications as of 02/16/2021  Medication Sig   albuterol (PROVENTIL HFA;VENTOLIN HFA) 108 (90 Base) MCG/ACT inhaler Inhale 2 puffs into the lungs every 6 (six) hours as needed for wheezing or shortness of breath.   alendronate (FOSAMAX) 70 MG tablet Take 1 tablet (70 mg total) by mouth once a week. Take with a full glass of water on an empty stomach.   apixaban (ELIQUIS) 5 MG TABS tablet Take 1 tablet (5 mg total) by mouth 2 (two) times daily.   ascorbic acid (VITAMIN C) 500 MG tablet Take 500 mg by mouth daily.   budesonide-formoterol (SYMBICORT) 160-4.5 MCG/ACT inhaler Inhale 2 puffs into the  lungs daily as needed.   carvedilol (COREG) 12.5 MG tablet Take 1.5 tablets (18.75 mg total) by mouth 2 (two) times daily with a meal.   Cholecalciferol 25 MCG (1000 UT) tablet Take 1,000 Units by mouth daily.   dapagliflozin propanediol (FARXIGA) 10 MG TABS tablet Take 1 tablet (10 mg total) by mouth daily before breakfast.   DULoxetine (CYMBALTA) 30 MG capsule Take 1 capsule (30 mg total) by mouth daily.   fluticasone (FLONASE) 50 MCG/ACT nasal spray Place 1 spray into both nostrils daily.   methimazole (TAPAZOLE) 5 MG tablet TAKE 3 TABLETS BY MOUTH  DAILY   mirtazapine (REMERON) 15 MG tablet Take 1 tablet (15 mg total) by mouth at bedtime.   pantoprazole (PROTONIX) 40 MG tablet Take 1 tablet (40 mg total) by mouth daily.   potassium chloride SA (KLOR-CON M) 20 MEQ tablet Take 1 tablet (20 mEq total) by mouth 2 (two) times daily.   rosuvastatin (CRESTOR) 40 MG tablet TAKE 1 TABLET BY MOUTH  DAILY   sacubitril-valsartan (ENTRESTO) 49-51 MG Take 1 tablet by mouth 2 (two) times daily.   senna-docusate (SENOKOT-S) 8.6-50 MG tablet Take 2 tablets by mouth at bedtime.   spironolactone (ALDACTONE) 25 MG tablet Take 1 tablet (25 mg total) by mouth daily.   terconazole (TERAZOL 7) 0.4 % vaginal cream Place 1 applicator vaginally at bedtime.   torsemide (DEMADEX) 20 MG tablet Take 76m daily alternating with 466mevery other day   traMADol (ULTRAM) 50 MG tablet TAKE 1 TABLET BY MOUTH THREE TIMES DAILY   trolamine salicylate (ASPERCREME) 10 % cream Apply  1 application topically as needed for muscle pain.   valACYclovir (VALTREX) 500 MG tablet Take on tablet by mouth twice a day for 3 days as needed for an outbreak   No facility-administered encounter medications on file as of 02/16/2021.    Patient Active Problem List   Diagnosis Date Noted   Depression, major, single episode, moderate ([place]) 12/08/2020   Gait abnormality 07/31/2020   Paresthesia 07/31/2020   Low back pain with bilateral sciatica  07/31/2020   Pneumonia due to COVID-19 virus    Bacteremia due to Gram-positive bacteria 08/29/2018   Abscess    Cellulitis of left breast 08/28/2018   Cellulitis of left abdominal wall 08/28/2018   Encounter for routine gynecological examination 04/28/2018   Stage 3 chronic kidney disease ([place]) 09/27/2017   Recurrent genital herpes 04/13/2017   Chronic pain disorder 01/25/2017   Malignant neoplasm of upper-outer quadrant of left breast in female, estrogen receptor positive ([place]) 12/16/2016   Chronic knee pain 12/07/2016   Chronic bilateral low back pain without sciatica 12/07/2016   Peripheral musculoskeletal gait disorder 12/07/2016   Encounter for therapeutic drug monitoring 06/16/2016   Generalized osteoarthritis of multiple sites 05/04/2016   Aortic valve disease 04/10/2016   S/P AVR 04/10/2016   NICM (nonischemic cardiomyopathy) ([place]) 04/10/2016   Upper airway cough syndrome 03/14/2016   Morbid obesity ([place]) 03/14/2016   COPD (chronic obstructive pulmonary disease) ([place]) 03/11/2016   Thrombocytopenia ([place]) 12/12/2015   Anemia 12/12/2015   Vitamin D deficiency 12/12/2015   Acute urinary retention 12/11/2015   Osteoporosis 12/11/2015   Hip fracture ([place]) 12/10/2015   Aortic atherosclerosis ([place]) 12/10/2015   Lung blebs ([place]) 12/03/2015   Pelvic fracture ([place]) 08/19/2015   Fall at home 08/19/2015   Essential hypertension 08/19/2015   Mixed hyperlipidemia 08/19/2015   Asthma 08/19/2015   Paroxysmal atrial fibrillation ([place]) 08/19/2015   Hyperthyroidism 08/19/2015   Chronic HFrEF (heart failure with reduced ejection fraction) ([facility])    Coronary artery disease    Aortic stenosis     Conditions to be addressed/monitored:Atrial Fibrillation, CHF, HTN, HLD, and Osteoarthritis  Care Plan : RN Care Manager Plan of Care  Updates made by Dimitri Ped, RN since 02/16/2021 12:00 AM     Problem: Chronic Disease Management and Care Coordination Needs (CHF, HTN, Atrial  Fib,HLD, chronic pain)   Priority: High     Long-Range Goal: Establish Plan of Care for Chronic Disease Management Needs (CHF, HTN, Atrial Fib, HLD,chronic pain)   Start Date: 01/15/2021  Expected End Date: 07/14/2021  Recent Progress: On track  Priority: High  Note:   Current Barriers:  Chronic Disease Management support and education needs related to Atrial Fibrillation, CHF, HTN, HLD, Osteoarthritis, and chronic pain States that she saw heart failure last week and he increased her torsemide and she has been going the the bathroom more. States that the torsemide is working better to help her keep her fluid off and she is taking the [place] as ordered.  Pt reports weighting daily. Denies any chest pains, reports her swelling in legs has improved.  Reports she wears compression hose and she is to get new hose. reports shortness of breath with exertion,  denies any recent atrial fibrillation, reports she has her blood checked regularly for her Coumadin, States she is checking her B/P 4-5 times a week and it was 122/70 hen she last checked it. Denies any dizziness States she tries to follow a low sodium diet and the fluid restriction that the heart  failure clinic gave her.  States she still has has chronic pain in her knees but her left knee is feeling better since she got the gel injection.  States she has not been walking but she has been doing leg lifts and squats  RNCM Clinical Goal(s):  Patient will verbalize understanding of plan for management of Atrial Fibrillation, CHF, HTN, HLD, Osteoarthritis, and chronic pain as evidenced by voiced adherence to plan of care verbalize basic understanding of  Atrial Fibrillation, CHF, HTN, HLD, Osteoarthritis, and chronic pain disease process and self health management plan as evidenced by voiced understanding and teach back take all medications exactly as prescribed and will call provider for medication related questions as evidenced by dispense report and  pt verbalization attend all scheduled medical appointments: HF clinic 03/13/21, pain 03/17/21, Dr. Saunders Revel 04/30/21 as evidenced by HF clinic 03/13/21, pain 03/17/21, Dr. Saunders Revel 04/30/21 demonstrate Improved adherence to prescribed treatment plan for Atrial Fibrillation, CHF, HTN, HLD, Osteoarthritis, and chronic pain as evidenced by reading within limits, voiced adherence to plan of care continue to work with RN Care Manager to address care management and care coordination needs related to  Atrial Fibrillation, CHF, HTN, HLD, Osteoarthritis, and chronic pain as evidenced by adherence to CM Team Scheduled appointments through collaboration with RN Care manager, provider, and care team.   Interventions: 1:1 collaboration with primary care provider regarding development and update of comprehensive plan of care as evidenced by provider attestation and co-signature Inter-disciplinary care team collaboration (see longitudinal plan of care) Evaluation of current treatment plan related to  self management and patient's adherence to plan as established by provider  Pain Interventions(Status:  Goal on track:  Yes.)  Long Term Goal Pain assessment performed Medications reviewed Reviewed provider established plan for pain management Discussed importance of adherence to all scheduled medical appointments Counseled on the importance of reporting any/all new or changed pain symptoms or management strategies to pain management provider Advised patient to report to care team affect of pain on daily activities Discussed use of relaxation techniques and/or diversional activities to assist with pain reduction (distraction, imagery, relaxation, massage, acupressure, TENS, heat, and cold application Reviewed with patient prescribed pharmacological and nonpharmacological pain relief strategies Reviewed to continue doing leg exercises and squats and to discuss with pain doctor about getting gel injection in her right knee    Hypertension Interventions:(Status:  Goal on track:  Yes.)  Long Term Goal Last practice recorded BP readings:  BP Readings from Last 3 Encounters:  12/24/20 130/80  12/20/20 122/85  12/17/20 110/70  Most recent eGFR/CrCl:  Lab Results  Component Value Date   EGFR 55 (L) 12/22/2016    No components found for: CRCL  Evaluation of current treatment plan related to hypertension self management and patient's adherence to plan as established by provider Provided education to patient re: stroke prevention, s/s of heart attack and stroke Discussed plans with patient for ongoing care management follow up and provided patient with direct contact information for care management team Advised patient, providing education and rationale, to monitor blood pressure daily and record, calling PCP for findings outside established parameters Reinforced to follow a low sodium diet Hyperlipidemia Interventions:Goal on track:  Yes(Status:  Goal on track:  Yes.)  Long Term Goal Medication review performed; medication list updated in electronic medical record.  Provider established cholesterol goals reviewed; Counseled on importance of regular laboratory monitoring as prescribed; Reviewed role and benefits of statin for ASCVD risk reduction;  Heart Failure Interventions:(Status:  Goal  on track:  Yes.)  Long Term Goal Basic overview and discussion of pathophysiology of Heart Failure reviewed Provided education on low sodium diet Discussed importance of daily weight and advised patient to weigh and record daily Reviewed role of diuretics in prevention of fluid overload and management of heart failure; Discussed the importance of keeping all appointments with provider Reviewed s/sx of HF, heart failure zones and when to call provider  Patient Goals/Self-Care Activities: Take all medications as prescribed Attend all scheduled provider appointments Call pharmacy for medication refills 3-7 days in advance of  running out of medications Call provider office for new concerns or questions  call office if I gain more than 2 pounds in one day or 5 pounds in one week keep legs up while sitting track weight in diary use salt in moderation watch for swelling in feet, ankles and legs every day follow rescue plan if symptoms flare-up check blood pressure daily choose a place to take my blood pressure (home, clinic or office, retail store) take blood pressure log to all doctor appointments take medications for blood pressure exactly as prescribed call for medicine refill 2 or 3 days before it runs out take all medications exactly as prescribed call doctor with any symptoms you believe are related to your medicine - learn how to meditate - learn relaxation techniques - practice acceptance of chronic pain - practice relaxation or meditation daily - tell myself I can (not I can't) - think of new ways to do favorite things - use distraction techniques - use relaxation during pain Follow Up Plan:  Telephone follow up appointment with care management team member scheduled for:  03/20/21 The patient has been provided with contact information for the care management team and has been advised to call with any health related questions or concerns.        Plan:Telephone follow up appointment with care management team member scheduled for:  03/20/21 The patient has been provided with contact information for the care management team and has been advised to call with any health related questions or concerns.  Peter Garter RN, Jackquline Denmark, CDE Care Management Coordinator [facility] [phone], Mobile [phone]

## 2021-02-19 ENCOUNTER — Ambulatory Visit (HOSPITAL_COMMUNITY)
Admission: RE | Admit: 2021-02-19 | Discharge: 2021-02-19 | Disposition: A | Discharge status: Home / Self Care | Payer: Medicare Other | Source: Ambulatory Visit | Attending: Internal Medicine | Admitting: Internal Medicine

## 2021-02-19 ENCOUNTER — Other Ambulatory Visit: Payer: Self-pay

## 2021-02-19 DIAGNOSIS — I5022 Chronic systolic (congestive) heart failure: Secondary | ICD-10-CM | POA: Insufficient documentation

## 2021-02-19 LAB — BASIC METABOLIC PANEL
Anion gap: 9 (ref 5–15)
BUN: 25 mg/dL — ABNORMAL HIGH (ref 8–23)
CO2: 19 mmol/L — ABNORMAL LOW (ref 22–32)
Calcium: 10.2 mg/dL (ref 8.9–10.3)
Chloride: 107 mmol/L (ref 98–111)
Creatinine, Ser: 2.09 mg/dL — ABNORMAL HIGH (ref 0.44–1.00)
GFR, Estimated: 24 mL/min — ABNORMAL LOW (ref >60)
Glucose, Bld: 92 mg/dL (ref 70–99)
Potassium: 5.4 mmol/L — ABNORMAL HIGH (ref 3.5–5.1)
Sodium: 135 mmol/L (ref 135–145)

## 2021-02-20 ENCOUNTER — Telehealth (HOSPITAL_COMMUNITY): Payer: Self-pay | Admitting: Surgery

## 2021-02-20 NOTE — Telephone Encounter (Signed)
I attempted to reach patient regarding labwork and recommendations per Dr. Aundra Dubin.  I left a message for a return call.

## 2021-02-20 NOTE — Telephone Encounter (Signed)
-----   Message from Larey Dresser, MD sent at 02/20/2021  2:16 PM EST ----- Decrease torsemide back to 40 mg daily and follow low sodium diet.  Stop KCl supplement.  BMET 10 days.

## 2021-02-25 ENCOUNTER — Telehealth (HOSPITAL_COMMUNITY): Payer: Self-pay

## 2021-02-25 NOTE — Telephone Encounter (Addendum)
Pt aware, agreeable, and verbalized understanding Has follow up appointment 03/13/2021 with app  ----- Message from Larey Dresser, MD sent at 02/20/2021  2:16 PM EST ----- Decrease torsemide back to 40 mg daily and follow low sodium diet.  Stop KCl supplement.  BMET 10 days.

## 2021-02-28 DIAGNOSIS — I480 Paroxysmal atrial fibrillation: Secondary | ICD-10-CM | POA: Diagnosis not present

## 2021-02-28 DIAGNOSIS — I5022 Chronic systolic (congestive) heart failure: Secondary | ICD-10-CM | POA: Diagnosis not present

## 2021-02-28 DIAGNOSIS — I10 Essential (primary) hypertension: Secondary | ICD-10-CM

## 2021-02-28 DIAGNOSIS — E782 Mixed hyperlipidemia: Secondary | ICD-10-CM | POA: Diagnosis not present

## 2021-02-28 DIAGNOSIS — M159 Polyosteoarthritis, unspecified: Secondary | ICD-10-CM

## 2021-03-04 ENCOUNTER — Telehealth: Payer: Self-pay | Admitting: Pharmacist

## 2021-03-04 ENCOUNTER — Ambulatory Visit: Payer: Medicare Other | Admitting: Nurse Practitioner

## 2021-03-04 MED ORDER — OZEMPIC (0.25 OR 0.5 MG/DOSE) 2 MG/1.5ML ~~LOC~~ SOPN: PEN_INJECTOR | SUBCUTANEOUS | 1 refills | Status: DC

## 2021-03-04 NOTE — Telephone Encounter (Signed)
Rx for Ozempic sent to pharmacy to determine copay. Copay in December was cost prohibitive at $237/month. Pt reported she is changing insurance plans for 2023 year. Hoping that copay will be cheaper.  Per pharmacy, pt still needs to bring in new 2023 insurance card. I called pt and left message.

## 2021-03-06 NOTE — Telephone Encounter (Signed)
Called pt again. States she's out of town this week but will bring her new insurance card to the pharmacy next week and have them re-run the Ozempic prescription to see if it's more affordable this year.

## 2021-03-09 ENCOUNTER — Other Ambulatory Visit (HOSPITAL_COMMUNITY): Payer: Self-pay

## 2021-03-09 NOTE — Progress Notes (Addendum)
PCP: [name], [name], MD Cardiology: Dr. Saunders Revel HF cardiology: Dr. Aundra Dubin  76 y.o. with history of aortic insufficiency s/p bioprosthetic aortic valve and CHF was referred by Dr End for evaluation of CHF.  Patient had bioprosthetic aortic valve replacement for aortic insufficiency in 1/16 at [facility] in [place], [place].  Since that time, she has had mild to moderately reduced LV systolic function, as low as 30-35% and as high at 45-50%.  Most recent echo in 9/21 showed EF 45-50%.  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  She has had paroxysmal atrial fibrillation and 2 prior CVAs.  She is on warfarin.  She has had breast cancer treated with lumpectomy and radiation, no chemotherapy.  She has history of hyperthyroidism and is on methimazole.  She has controlled HTN.    Cardiac MRI (10/22) with LV EF 41%, normal RV, subendocardial LGE in the mid-apical inferior wall and the mid-apical inferolateral wall.   Echo 12/22, very technically difficult study with poor images, EF around 35%, RV poorly visualized, the bioprosthetic aortic valve looked ok.    Today she returns for HF follow up with her husband. Overall feeling fine. She is short of breath walking up stairs or walking longer distances on flat ground. Uses her walker at home but arrived in wheelchair today due to her knee OA. She has chronic 2 pillow orthopnea. Denies palpitations, CP, dizziness, edema, or abnormal bleeding. Appetite ok. No fever or chills. Weight at home 250 pounds. She has been out of Entresto x 2 weeks. She is asking if she needs a ColoGuard and if her insurance will cover her Ozempic.  REDS clip 38%  Labs (5/22): K 4.9, creatinine 1.21 Labs (9/22): K 4.9, creatinine 1.4 Labs (10/22): K 3.4, creatinine 1.8, TSH normal Labs (12/22): K 5.4, creatinine 2.09  ECG (personally reviewed): NSR, 1st degree AVB PR 212 msec, old anterolateral MI  PMH: 1. Left breast cancer: Treated with lumpectomy and radiation, no  chemotherapy.  2. Hyperthyroidism 3. Aortic insufficiency: Severe, found when she lived in [place].  She had bioprosthetic aortic valve replacement at [facility] in 1/16.  4. Atrial fibrillation: Paroxysmal. 5. CVA x 2 6. HTN 7. Hyperlipidemia 8. Chronic systolic CHF: Nonischemic cardiomyopathy.  - Echo (3/16): EF 45-50% - Echo (1/17): EF 30-35% - Echo (2/18): EF 35-40% - Echo (11/20): EF 35-40% - Echo (9/21): EF 45-50%, global hypokinesis, mild LVH, bioprosthetic aortic valve mean gradient 10 mmHg, normal RV - RHC/LHC (1/22): mean RA 9, PA 35/20, mean PCWP 21, CI 3.2.  Nonobstructive CAD.  - cMRI (10/22): LV EF 41%, normal RV, subendocardial LGE in the mid-apical inferior wall and the mid-apical inferolateral wall. - Echo (12/22): Very technically difficult study with poor images, EF around 35%, RV poorly visualized, the bioprosthetic aortic valve looked ok. 9. H/o spontaneous PTX  Social History   Socioeconomic History   Marital status: Married    Spouse name: Sherwood   Number of children: 0   Years of education: 12   Highest education level: 12th grade  Occupational History   Occupation: Retired in 2004  Tobacco Use   Smoking status: Former    Packs/day: 0.25    Years: 10.00    Pack years: 2.50    Types: Cigarettes    Quit date: 2012    Years since quitting: 11.0   Smokeless tobacco: Never  Vaping Use   Vaping Use: Never used  Substance and Sexual Activity   Alcohol use: No  Drug use: Never   Sexual activity: Yes    Birth control/protection: Post-menopausal  Other Topics Concern   Not on file  Social History Narrative   Lives with husband.  Ambulated independently.   Right-handed.   No daily caffeine use.   Social Determinants of Health   Financial Resource Strain: Medium Risk   Difficulty of Paying Living Expenses: Somewhat hard  Food Insecurity: No Food Insecurity   Worried About Charity fundraiser in the Last Year: Never true   Ran Out of Food  in the Last Year: Never true  Transportation Needs: No Transportation Needs   Lack of Transportation (Medical): No   Lack of Transportation (Non-Medical): No  Physical Activity: Insufficiently Active   Days of Exercise per Week: 5 days   Minutes of Exercise per Session: 20 min  Stress: No Stress Concern Present   Feeling of Stress : Not at all  Social Connections: Moderately Integrated   Frequency of Communication with Friends and Family: More than three times a week   Frequency of Social Gatherings with Friends and Family: More than three times a week   Attends Religious Services: More than 4 times per year   Active Member of [employer] or Organizations: No   Attends Music therapist: Never   Marital Status: Married  Human resources officer Violence: Not At Risk   Fear of Current or Ex-Partner: No   Emotionally Abused: No   Physically Abused: No   Sexually Abused: No   Family History  Problem Relation Age of Onset   Diabetes Mother    Heart attack Mother 1   Diabetes Father    Lung cancer Father    Diabetes Sister    Thyroid disease Sister    Diabetes Sister    HIV Brother    Colon polyps Neg Hx    Colon cancer Neg Hx    ROS: All systems reviewed and negative except as per HPI.   Current Outpatient Medications  Medication Sig Dispense Refill   albuterol (PROVENTIL HFA;VENTOLIN HFA) 108 (90 Base) MCG/ACT inhaler Inhale 2 puffs into the lungs every 6 (six) hours as needed for wheezing or shortness of breath.     alendronate (FOSAMAX) 70 MG tablet Take 1 tablet (70 mg total) by mouth once a week. Take with a full glass of water on an empty stomach. 13 tablet 3   apixaban (ELIQUIS) 5 MG TABS tablet Take 1 tablet (5 mg total) by mouth 2 (two) times daily. 60 tablet 5   ascorbic acid (VITAMIN C) 500 MG tablet Take 500 mg by mouth daily.     budesonide-formoterol (SYMBICORT) 160-4.5 MCG/ACT inhaler Inhale 2 puffs into the lungs daily as needed. 3 each 3   carvedilol (COREG)  12.5 MG tablet Take 1.5 tablets (18.75 mg total) by mouth 2 (two) times daily with a meal. 252 tablet 3   Cholecalciferol 25 MCG (1000 UT) tablet Take 1,000 Units by mouth daily.     dapagliflozin propanediol (FARXIGA) 10 MG TABS tablet Take 1 tablet (10 mg total) by mouth daily before breakfast. 30 tablet 11   DULoxetine (CYMBALTA) 30 MG capsule Take 1 capsule (30 mg total) by mouth daily. 30 capsule 1   fluticasone (FLONASE) 50 MCG/ACT nasal spray Place 1 spray into both nostrils daily. 16 g 3   methimazole (TAPAZOLE) 5 MG tablet TAKE 3 TABLETS BY MOUTH  DAILY 270 tablet 3   mirtazapine (REMERON) 15 MG tablet Take 1 tablet (15 mg total) by  mouth at bedtime. 30 tablet 0   pantoprazole (PROTONIX) 40 MG tablet Take 1 tablet (40 mg total) by mouth daily. 30 tablet 11   rosuvastatin (CRESTOR) 40 MG tablet TAKE 1 TABLET BY MOUTH  DAILY 90 tablet 3   senna-docusate (SENOKOT-S) 8.6-50 MG tablet Take 2 tablets by mouth at bedtime.     spironolactone (ALDACTONE) 25 MG tablet Take 1 tablet (25 mg total) by mouth daily. 90 tablet 3   terconazole (TERAZOL 7) 0.4 % vaginal cream Place 1 applicator vaginally at bedtime. 45 g 0   torsemide (DEMADEX) 20 MG tablet Take 60mg  daily alternating with 40mg  every other day 420 tablet 2   traMADol (ULTRAM) 50 MG tablet TAKE 1 TABLET BY MOUTH THREE TIMES DAILY 90 tablet 5   trolamine salicylate (ASPERCREME) 10 % cream Apply 1 application topically as needed for muscle pain.     valACYclovir (VALTREX) 500 MG tablet Take on tablet by mouth twice a day for 3 days as needed for an outbreak 30 tablet 0   sacubitril-valsartan (ENTRESTO) 49-51 MG Take 1 tablet by mouth 2 (two) times daily. (Patient not taking: Reported on 03/13/2021) 180 tablet 3   Semaglutide,0.25 or 0.5MG /DOS, (OZEMPIC, 0.25 OR 0.5 MG/DOSE,) 2 MG/1.5ML SOPN Inject 0.25mg  subcutaneously weekly for 4 weeks, then increase to 0.5mg  weekly for 4 weeks (Patient not taking: Reported on 03/13/2021) 1.5 mL 1   No  current facility-administered medications for this encounter.   Wt Readings from Last 3 Encounters:  03/13/21 116.7 kg (257 lb 3.2 oz)  02/10/21 113.9 kg (251 lb)  02/06/21 114.3 kg (252 lb)   BP 110/70    Pulse 82    Wt 116.7 kg (257 lb 3.2 oz)    SpO2 96%    BMI 40.28 kg/m  General:  NAD. No resp difficulty, arrived in [facility] HEENT: Normal Neck: Supple. Thick neck. Carotids 2+ bilat; no bruits. No lymphadenopathy or thryomegaly appreciated. Cor: PMI nondisplaced. Regular rate & rhythm. No rubs, gallops or murmurs. Lungs: Clear Abdomen: Obese, nontender, nondistended. No hepatosplenomegaly. No bruits or masses. Good bowel sounds. Extremities: No cyanosis, clubbing, rash, trace BLE edema Neuro: Alert & oriented x 3, cranial nerves grossly intact. Moves all 4 extremities w/o difficulty. Affect pleasant.  Assessment/Plan: 1. Chronic HF with mid range EF: Nonischemic cardiomyopathy.  Echo in 9/21 showed EF 45-50%, global hypokinesis, mild LVH, bioprosthetic aortic valve mean gradient 10 mmHg, normal RV.  Cause of cardiomyopathy is uncertain, mild CAD on 1/22 cath.  It is possible that this is chronic effect of long-standing AI that was not improved with valve replacement.  Also consider long-standing HTN.  Cardiac MRI in 10/22 showed LV EF 41%, normal RV, subendocardial LGE in the mid-apical inferior wall and the mid-apical inferolateral wall. The LGE pattern does not look like cardiac amyloidosis.  The subendocardial LGE looks like it came from prior MI but minimal CAD on cath => ?prior embolism down a coronary related to atrial fibrillation. Echo 12/22 was a very poor study, EF looks to be around 35%.  NYHA class III, she is limited by knee pain.  She is mildly volume overloaded by exam and REDS clip.   - Restart Entresto 49/51 mg bid. BMET/BNP today, BMET in 10-14 days. Samples given today.  - Decrease carvedilol back to 18.75 mg bid (she has been taking 25 bid). - Continue torsemide 40 mg  daily. - Continue Farxiga 10 mg daily. No GU symptoms. - Continue spironolactone 25 mg daily.  - Increase  activity.  2. Aortic valve disorders: Per report, had severe AI.  Now s/p bioprosthetic aortic valve replacement.  The valve functions normally on echo 12/22.  - Will need antibiotic prophylaxis with dental work.  3. HTN: BP controlled on current regimen. Med changes as above. 4. Atrial fibrillation: Paroxysmal.  She is in NSR today. - Continue apixaban.   5. Obesity: Body mass index is 40.28 kg/m. - She is in the process of obtaining semaglutide. She has been contacted by pharmacy regarding her new insurance and pricing.  Followup 6 weeks with APP (ReDs) and 4 months with Dr. Aundra Dubin. Consider paramedicine.  [place], [place] 03/13/2021

## 2021-03-10 ENCOUNTER — Other Ambulatory Visit: Payer: Self-pay | Admitting: Internal Medicine

## 2021-03-10 NOTE — Telephone Encounter (Signed)
Attempted to reach pt. No answer. LDM on vm.  Notified pt on vm that we received a fax from [employer] notifying that she has been approved and eligible to receive Entresto until 02/28/2022 at no cost to patient. Notified pt she may call back with any further questions.  Placed paperwork in designated file.

## 2021-03-11 ENCOUNTER — Ambulatory Visit: Payer: Medicare Other | Admitting: Nurse Practitioner

## 2021-03-13 ENCOUNTER — Other Ambulatory Visit: Payer: Self-pay

## 2021-03-13 ENCOUNTER — Ambulatory Visit (HOSPITAL_COMMUNITY)
Admission: RE | Admit: 2021-03-13 | Discharge: 2021-03-13 | Disposition: A | Discharge status: Home / Self Care | Payer: Medicare HMO | Source: Ambulatory Visit | Attending: Family Medicine | Admitting: Family Medicine

## 2021-03-13 ENCOUNTER — Encounter (HOSPITAL_COMMUNITY): Payer: Self-pay

## 2021-03-13 VITALS — BP 110/70 | HR 82 | Wt 257.2 lb

## 2021-03-13 DIAGNOSIS — I480 Paroxysmal atrial fibrillation: Secondary | ICD-10-CM | POA: Insufficient documentation

## 2021-03-13 DIAGNOSIS — Z853 Personal history of malignant neoplasm of breast: Secondary | ICD-10-CM | POA: Diagnosis not present

## 2021-03-13 DIAGNOSIS — Z7984 Long term (current) use of oral hypoglycemic drugs: Secondary | ICD-10-CM | POA: Diagnosis not present

## 2021-03-13 DIAGNOSIS — I440 Atrioventricular block, first degree: Secondary | ICD-10-CM | POA: Diagnosis not present

## 2021-03-13 DIAGNOSIS — M25569 Pain in unspecified knee: Secondary | ICD-10-CM | POA: Insufficient documentation

## 2021-03-13 DIAGNOSIS — R0601 Orthopnea: Secondary | ICD-10-CM | POA: Diagnosis not present

## 2021-03-13 DIAGNOSIS — Z79899 Other long term (current) drug therapy: Secondary | ICD-10-CM | POA: Diagnosis not present

## 2021-03-13 DIAGNOSIS — E669 Obesity, unspecified: Secondary | ICD-10-CM | POA: Diagnosis not present

## 2021-03-13 DIAGNOSIS — I428 Other cardiomyopathies: Secondary | ICD-10-CM | POA: Insufficient documentation

## 2021-03-13 DIAGNOSIS — Z8673 Personal history of transient ischemic attack (TIA), and cerebral infarction without residual deficits: Secondary | ICD-10-CM | POA: Insufficient documentation

## 2021-03-13 DIAGNOSIS — I2510 Atherosclerotic heart disease of native coronary artery without angina pectoris: Secondary | ICD-10-CM | POA: Diagnosis not present

## 2021-03-13 DIAGNOSIS — I252 Old myocardial infarction: Secondary | ICD-10-CM | POA: Diagnosis not present

## 2021-03-13 DIAGNOSIS — Z6841 Body Mass Index (BMI) 40.0 and over, adult: Secondary | ICD-10-CM | POA: Insufficient documentation

## 2021-03-13 DIAGNOSIS — Z952 Presence of prosthetic heart valve: Secondary | ICD-10-CM

## 2021-03-13 DIAGNOSIS — E0590 Thyrotoxicosis, unspecified without thyrotoxic crisis or storm: Secondary | ICD-10-CM | POA: Insufficient documentation

## 2021-03-13 DIAGNOSIS — Z923 Personal history of irradiation: Secondary | ICD-10-CM | POA: Insufficient documentation

## 2021-03-13 DIAGNOSIS — M179 Osteoarthritis of knee, unspecified: Secondary | ICD-10-CM | POA: Diagnosis not present

## 2021-03-13 DIAGNOSIS — I110 Hypertensive heart disease with heart failure: Secondary | ICD-10-CM | POA: Insufficient documentation

## 2021-03-13 DIAGNOSIS — Z7901 Long term (current) use of anticoagulants: Secondary | ICD-10-CM | POA: Insufficient documentation

## 2021-03-13 DIAGNOSIS — Z953 Presence of xenogenic heart valve: Secondary | ICD-10-CM | POA: Insufficient documentation

## 2021-03-13 DIAGNOSIS — I5022 Chronic systolic (congestive) heart failure: Secondary | ICD-10-CM | POA: Diagnosis not present

## 2021-03-13 DIAGNOSIS — Z86718 Personal history of other venous thrombosis and embolism: Secondary | ICD-10-CM | POA: Insufficient documentation

## 2021-03-13 DIAGNOSIS — I10 Essential (primary) hypertension: Secondary | ICD-10-CM

## 2021-03-13 LAB — BASIC METABOLIC PANEL
Anion gap: 7 (ref 5–15)
BUN: 16 mg/dL (ref 8–23)
CO2: 23 mmol/L (ref 22–32)
Calcium: 9.7 mg/dL (ref 8.9–10.3)
Chloride: 110 mmol/L (ref 98–111)
Creatinine, Ser: 1.35 mg/dL — ABNORMAL HIGH (ref 0.44–1.00)
GFR, Estimated: 41 mL/min — ABNORMAL LOW (ref >60)
Glucose, Bld: 90 mg/dL (ref 70–99)
Potassium: 4.3 mmol/L (ref 3.5–5.1)
Sodium: 140 mmol/L (ref 135–145)

## 2021-03-13 MED ORDER — ENTRESTO 49-51 MG PO TABS: 1.0000 | ORAL_TABLET | Freq: Two times a day (BID) | ORAL | 4 refills | Status: DC

## 2021-03-13 MED ORDER — CARVEDILOL 12.5 MG PO TABS: 18.7500 mg | ORAL_TABLET | Freq: Two times a day (BID) | ORAL | 3 refills | Status: DC

## 2021-03-13 MED ORDER — SACUBITRIL-VALSARTAN 49-51 MG PO TABS: 1.0000 | ORAL_TABLET | Freq: Two times a day (BID) | ORAL | Status: DC

## 2021-03-13 NOTE — Patient Instructions (Signed)
Restart Entresto 49/51 mg Twice daily  DECREASE Coreg to 18.75 mg (1 1/2 tablets ) Twice daily  Labs done today, your results will be available in MyChart, we will contact you for abnormal readings.  Follow up lab work in 10-14 days  Your physician recommends that you schedule a follow-up appointment in: 6 weeks with app clinic  Your physician recommends that you schedule a follow-up appointment in: 4  months with Dr, Lavell Islam your primary doctor about cologuard screening  Call [facility]  pharmacist  about Ozempic  If you have any questions or concerns before your next appointment please send [place] a message through [place] or call our office at [phone].    TO LEAVE A MESSAGE FOR THE NURSE SELECT OPTION 2, PLEASE LEAVE A MESSAGE INCLUDING: YOUR NAME DATE OF BIRTH CALL BACK NUMBER REASON FOR CALL**this is important as we prioritize the call backs  YOU WILL RECEIVE A CALL BACK THE SAME DAY AS LONG AS YOU CALL BEFORE 4:00 PM At the [facility], you and your health needs are our priority. As part of our continuing mission to provide you with exceptional heart care, we have created designated Provider Care Teams. These Care Teams include your primary Cardiologist (physician) and Advanced Practice Providers (APPs- Physician Assistants and Nurse Practitioners) who all work together to provide you with the care you need, when you need it.   You may see any of the following providers on your designated Care Team at your next follow up: Dr Glori Bickers Dr Haynes Kerns, NP Lyda Jester, [place] [facility] [place], [place] Audry Riles, PharmD   Please be sure to bring in all your medications bottles to every appointment.

## 2021-03-13 NOTE — Progress Notes (Signed)
ReDS Vest / Clip - 03/13/21 0800       ReDS Vest / Clip   Station Marker D    Ruler Value 33    ReDS Value Range Moderate volume overload    ReDS Actual Value 38

## 2021-03-17 ENCOUNTER — Encounter: Payer: Medicare HMO | Attending: Physical Medicine & Rehabilitation | Admitting: Physical Medicine & Rehabilitation

## 2021-03-17 ENCOUNTER — Other Ambulatory Visit: Payer: Self-pay

## 2021-03-17 ENCOUNTER — Encounter: Payer: Self-pay | Admitting: Physical Medicine & Rehabilitation

## 2021-03-17 VITALS — BP 126/84 | HR 82 | Temp 98.4°F | Ht 67.0 in | Wt 250.0 lb

## 2021-03-17 DIAGNOSIS — G894 Chronic pain syndrome: Secondary | ICD-10-CM | POA: Diagnosis not present

## 2021-03-17 DIAGNOSIS — Z5181 Encounter for therapeutic drug level monitoring: Secondary | ICD-10-CM | POA: Insufficient documentation

## 2021-03-17 DIAGNOSIS — Z79891 Long term (current) use of opiate analgesic: Secondary | ICD-10-CM | POA: Insufficient documentation

## 2021-03-17 NOTE — Progress Notes (Signed)
Subjective:    Patient ID: Danielle [name], female    DOB: September 08, 1945, 76 y.o.   MRN: [id number]  HPI 76 year old female with chronic low back pain as well as chronic knee pain bilaterally.  Her low back pain responded to medial branch blocks however radiofrequency neurotomy of the same L3-4-5 nerves did not produce any long-lasting results. Left knee Synvisc 01/27/21, now with no pain on Left side , would like to schedule right side Synvisc injection Continues to use a cane for ambulation.  The patient only ambulates household distances and uses a transport chair to get to and from the car during this clinic visit Pain Inventory Average Pain 6 Pain Right Now 4 My pain is intermittent, tingling, and aching  In the last 24 hours, has pain interfered with the following? General activity 5 Relation with others 0 Enjoyment of life 0 What TIME of day is your pain at its worst? morning  Sleep (in general) Poor  Pain is worse with: walking Pain improves with: medication and HEAT Relief from Meds: 7  Family History  Problem Relation Age of Onset   Diabetes Mother    Heart attack Mother 54   Diabetes Father    Lung cancer Father    Diabetes Sister    Thyroid disease Sister    Diabetes Sister    HIV Brother    Colon polyps Neg Hx    Colon cancer Neg Hx    Social History   Socioeconomic History   Marital status: Married    Spouse name: Sherwood   Number of children: 0   Years of education: 12   Highest education level: 12th grade  Occupational History   Occupation: Retired in 2004  Tobacco Use   Smoking status: Former    Packs/day: 0.25    Years: 10.00    Pack years: 2.50    Types: Cigarettes    Quit date: 2012    Years since quitting: 11.0   Smokeless tobacco: Never  Vaping Use   Vaping Use: Never used  Substance and Sexual Activity   Alcohol use: No   Drug use: Never   Sexual activity: Yes    Birth control/protection: Post-menopausal  Other  Topics Concern   Not on file  Social History Narrative   Lives with husband.  Ambulated independently.   Right-handed.   No daily caffeine use.   Social Determinants of Health   Financial Resource Strain: Medium Risk   Difficulty of Paying Living Expenses: Somewhat hard  Food Insecurity: No Food Insecurity   Worried About Charity fundraiser in the Last Year: Never true   Ran Out of Food in the Last Year: Never true  Transportation Needs: No Transportation Needs   Lack of Transportation (Medical): No   Lack of Transportation (Non-Medical): No  Physical Activity: Insufficiently Active   Days of Exercise per Week: 5 days   Minutes of Exercise per Session: 20 min  Stress: No Stress Concern Present   Feeling of Stress : Not at all  Social Connections: Moderately Integrated   Frequency of Communication with Friends and Family: More than three times a week   Frequency of Social Gatherings with Friends and Family: More than three times a week   Attends Religious Services: More than 4 times per year   Active Member of Clubs or Organizations: No   Attends Archivist Meetings: Never   Marital Status: Married   Past Surgical History:  Procedure Laterality Date  AORTIC VALVE REPLACEMENT  03/12/2014   [facility] in [place];  St Jude 73mm, medial Trifecta Bioprosthesis   BREAST LUMPECTOMY WITH RADIOACTIVE SEED AND SENTINEL LYMPH NODE BIOPSY Left 01/14/2017   Procedure: LEFT BREAST LUMPECTOMY WITH RADIOACTIVE SEED AND SENTINEL LYMPH NODE BIOPSY;  Surgeon: Rolm Bookbinder, MD;  Location: [place];  Service: General;  Laterality: Left;   CARDIAC CATHETERIZATION  02/12/2014   [facility] in [place] D & C N/A 11/17/2020   Procedure: DILATATION AND CURETTAGE /HYSTEROSCOPY WITH MYOSURE;  Surgeon: Nunzio Cobbs, MD;  Location: [facility];  Service: Gynecology;  Laterality: N/A;   INTRAMEDULLARY (IM) NAIL INTERTROCHANTERIC Left  12/10/2015   Procedure: INTRAMEDULLARY (IM) NAIL INTERTROCHANTRIC;  Surgeon: Leandrew Koyanagi, MD;  Location: [place];  Service: Orthopedics;  Laterality: Left;   OPERATIVE ULTRASOUND N/A 11/17/2020   Procedure: OPERATIVE ULTRASOUND;  Surgeon: Nunzio Cobbs, MD;  Location: [facility];  Service: Gynecology;  Laterality: N/A;   PLEURADESIS Left 12/03/2015   Procedure: PLEURADESIS;  Surgeon: Melrose Nakayama, MD;  Location: [place];  Service: Thoracic;  Laterality: Left;   RESECTION OF APICAL BLEB Left 12/03/2015   Procedure: BLEBECTOMY;  Surgeon: Melrose Nakayama, MD;  Location: [place];  Service: Thoracic;  Laterality: Left;   RIGHT/LEFT HEART CATH AND CORONARY ANGIOGRAPHY N/A 03/10/2020   Procedure: RIGHT/LEFT HEART CATH AND CORONARY ANGIOGRAPHY;  Surgeon: [name], MD;  Location: [place] [facility];  Service: Cardiovascular;  Laterality: N/A;   VIDEO ASSISTED THORACOSCOPY Left 12/03/2015   Procedure: VIDEO ASSISTED THORACOSCOPY;  Surgeon: Melrose Nakayama, MD;  Location: [place];  Service: Thoracic;  Laterality: Left;   VIDEO ASSISTED THORACOSCOPY (VATS) W/TALC PLEUADESIS Right 11/19/2014   in [place]   Past Surgical History:  Procedure Laterality Date   AORTIC VALVE REPLACEMENT  03/12/2014   [facility] in [place];  St Jude 35mm, medial Trifecta Bioprosthesis   BREAST LUMPECTOMY WITH RADIOACTIVE SEED AND SENTINEL LYMPH NODE BIOPSY Left 01/14/2017   Procedure: LEFT BREAST LUMPECTOMY WITH RADIOACTIVE SEED AND SENTINEL LYMPH NODE BIOPSY;  Surgeon: Rolm Bookbinder, MD;  Location: [place];  Service: General;  Laterality: Left;   CARDIAC CATHETERIZATION  02/12/2014   [facility] in [place] D & C N/A 11/17/2020   Procedure: DILATATION AND CURETTAGE /HYSTEROSCOPY WITH MYOSURE;  Surgeon: Nunzio Cobbs, MD;  Location: [facility];  Service: Gynecology;  Laterality: N/A;   INTRAMEDULLARY (IM) NAIL  INTERTROCHANTERIC Left 12/10/2015   Procedure: INTRAMEDULLARY (IM) NAIL INTERTROCHANTRIC;  Surgeon: Leandrew Koyanagi, MD;  Location: [place];  Service: Orthopedics;  Laterality: Left;   OPERATIVE ULTRASOUND N/A 11/17/2020   Procedure: OPERATIVE ULTRASOUND;  Surgeon: Nunzio Cobbs, MD;  Location: [facility];  Service: Gynecology;  Laterality: N/A;   PLEURADESIS Left 12/03/2015   Procedure: PLEURADESIS;  Surgeon: Melrose Nakayama, MD;  Location: [place];  Service: Thoracic;  Laterality: Left;   RESECTION OF APICAL BLEB Left 12/03/2015   Procedure: BLEBECTOMY;  Surgeon: Melrose Nakayama, MD;  Location: [place];  Service: Thoracic;  Laterality: Left;   RIGHT/LEFT HEART CATH AND CORONARY ANGIOGRAPHY N/A 03/10/2020   Procedure: RIGHT/LEFT HEART CATH AND CORONARY ANGIOGRAPHY;  Surgeon: [name], MD;  Location: [place] [facility];  Service: Cardiovascular;  Laterality: N/A;   VIDEO ASSISTED THORACOSCOPY Left 12/03/2015   Procedure: VIDEO ASSISTED THORACOSCOPY;  Surgeon: Revonda Standard  Roxan Hockey, MD;  Location: [place] [facility];  Service: Thoracic;  Laterality: Left;   VIDEO ASSISTED THORACOSCOPY (VATS) W/TALC PLEUADESIS Right 11/19/2014   in [place]   Past Medical History:  Diagnosis Date   Anticoagulated on Coumadin    managed by cardiology   Anxiety    Chronic constipation    Chronic systolic CHF (congestive heart failure) ([place]) 2016   cardiologist--- dr end and followed by CHF clinic   CKD (chronic kidney disease), stage III ([place])    COPD (chronic obstructive pulmonary disease) ([place])    previous had seen pulmonology --- dr Melvyn Novas, note in epic 03-14-2016, dx COPD GOLD 0   Coronary artery disease    cardiologist--- dr Durward Mallard;  last cardiac cath 01-10-20202 mild nonobstructive disease proxLAD;  per pt, had LHC prior to AVR in [place] 2016   DOE (dyspnea on exertion)    GERD (gastroesophageal reflux disease)    History of 2019 novel coronavirus disease (COVID-19) 12/19/2019    hospital admission w/ covid pneumonia , no intubatiion,  per pt symptoms resolved and back to baseline   History of cerebrovascular accident (CVA) with residual deficit    CVA in 2000 without residuals;  CVA post op AVR 01/ 2016 with residual right hand weakness   History of gastritis    History of pneumothorax    s/p right vats 11-19-2014 and  s/p left vats  12-13-2015   (both spontenaous due to bleb)   Hyperlipidemia    Hypertension    Hyperthyroidism    per pt followed by pcp,  dx approx 2014   Malignant neoplasm of upper-outer quadrant of left breast in female, estrogen receptor positive ([place]) 11/2016   oncologist-- dr Burr Medico--- dx 10/ 2018  ,  s/p left lumptectomy with node dissection;  completed radiation 04-15-2017, no chemo   Multiple thyroid nodules    in care everywhere pt had left thyroid nodule biospy 09-18-2014 benign   NICM (nonischemic cardiomyopathy) ([place]) 2016   2016  ef 30%;  last echo in epic ef 45%   OA (osteoarthritis)    Osteoporosis    Paroxysmal atrial fibrillation [facility])    cardiologist--- dr end   Personal history of radiation therapy    left breast cancer  03-08-2017  to 04-05-2017   Thrombocytopenia [facility])    followed by dr Burr Medico   Valvular stenosis, aortic 03/12/2014   Status post bioprosthetic AVR  in [place]  for severe AS   Wears dentures    full upper   There were no vitals taken for this visit.  Opioid Risk Score:   Fall Risk Score:  `1  Depression screen PHQ 2/9  Depression screen [facility] 2/9 01/19/2021 12/08/2020 11/27/2020 09/19/2020 09/16/2020 08/13/2020 06/24/2020  Decreased Interest 0 3 0 0 0 0 0  Down, Depressed, Hopeless 0 3 0 0 0 0 0  PHQ - 2 Score 0 6 0 0 0 0 0  Altered sleeping - 3 - - - - -  Tired, decreased energy - 3 - - - - -  Change in appetite - 3 - - - - -  Feeling bad or failure about yourself  - 3 - - - - -  Trouble concentrating - 2 - - - - -  Moving slowly or fidgety/restless - 3 - - - - -  Suicidal thoughts - 0 - - - - -   PHQ-9 Score - 23 - - - - -  Difficult doing work/chores - Somewhat difficult - - - - -  Some recent data might be hidden    Review of Systems  Musculoskeletal:  Positive for gait problem.       RIGHT KNEE PAIN  All other systems reviewed and are negative.     Objective:   Physical Exam Vitals and nursing note reviewed.  Constitutional:      Appearance: She is obese.  HENT:     Head: Normocephalic and atraumatic.  Musculoskeletal:     Right lower leg: Edema present.     Left lower leg: Edema present.     Comments: No tenderness palpation or with range of motion of the left knee. There is tenderness of the lateral greater than medial joint line mild tenderness around the patellar area.  No evidence of erythema of the right knee there is full extension but pain with flexion.  Neurological:     Mental Status: She is alert and oriented to person, place, and time.     Comments: Motor strength is 5/5 bilateral hip flexor knee extensor ankle dorsiflexor Negative straight leg raise Sensation intact lower limbs.  Psychiatric:        Mood and Affect: Mood normal.        Behavior: Behavior normal.    Reviewed x-rays with patient looking at the medial bone-on-bone changes on x-ray right knee relatively preserved lateral joint space      Assessment & Plan:  #1.  Bilateral knee osteoarthritis mainly medial compartment.  She had an excellent result with Synvisc injection of the left knee.  Would like to schedule right knee.  She would do best with lateral compartment versus suprapatellar injection under fluoroscopic guidance. 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

## 2021-03-17 NOTE — Patient Instructions (Signed)
Sodium Hyaluronate intra-articular injection What is this medication? SODIUM HYALURONATE (SOE dee um hye al yoor ON ate) is used to treat pain in the knee due to osteoarthritis. This medicine may be used for other purposes; ask your health care provider or pharmacist if you have questions. COMMON BRAND NAME(S): Amvisc, DUROLANE, Euflexxa, GELSYN-3, Hyalgan, Hymovis, Monovisc, Orthovisc, Supartz, Supartz FX, TriVisc, VISCO What should I tell my care team before I take this medication? They need to know if you have any of these conditions: bleeding disorders glaucoma infection in the knee joint skin conditions or sensitivity skin infection an unusual allergic reaction to sodium hyaluronate, other medicines, foods, dyes, or preservatives. Different brands of sodium hyaluronate contain different allergens. Some may contain egg. Talk to your doctor about your allergies to make sure that you get the right product. pregnant or trying to get pregnant breast-feeding How should I use this medication? This medicine is for injection into the knee joint. It is given by a health care professional in a hospital or clinic setting. Talk to your pediatrician regarding the use of this medicine in children. Special care may be needed. Overdosage: If you think you have taken too much of this medicine contact a poison control center or emergency room at once. NOTE: This medicine is only for you. Do not share this medicine with others. What if I miss a dose? This does not apply. What may interact with this medication? Interactions are not expected. This list may not describe all possible interactions. Give your health care provider a list of all the medicines, herbs, non-prescription drugs, or dietary supplements you use. Also tell them if you smoke, drink alcohol, or use illegal drugs. Some items may interact with your medicine. What should I watch for while using this medication? Tell your doctor or healthcare  professional if your symptoms do not start to get better or if they get worse. If receiving this medicine for osteoarthritis, limit your activity after you receive your injection. Avoid physical activity for 48 hours following your injection to keep your knee from swelling. Do not stand on your feet for more than 1 hour at a time during the first 48 hours following your injection. Ask your doctor or healthcare professional about when you can begin major physical activity again. What side effects may I notice from receiving this medication? Side effects that you should report to your doctor or health care professional as soon as possible: allergic reactions like skin rash, itching or hives, swelling of the face, lips, or tongue dizziness facial flushing pain, tingling, numbness in the hands or feet vision changes if received this medicine during eye surgery Side effects that usually do not require medical attention (report to your doctor or health care professional if they continue or are bothersome): back pain bruising at site where injected chills diarrhea fever headache joint pain joint stiffness joint swelling muscle cramps muscle pain nausea, vomiting pain, redness, or irritation at site where injected weak or tired This list may not describe all possible side effects. Call your doctor for medical advice about side effects. You may report side effects to FDA at 1-800-FDA-1088. Where should I keep my medication? This drug is given in a hospital or clinic and will not be stored at home. NOTE: This sheet is a summary. It may not cover all possible information. If you have questions about this medicine, talk to your doctor, pharmacist, or health care provider.  2022 Elsevier/Gold Standard (2015-03-20 00:00:00)

## 2021-03-20 ENCOUNTER — Ambulatory Visit (INDEPENDENT_AMBULATORY_CARE_PROVIDER_SITE_OTHER): Payer: Medicare HMO

## 2021-03-20 DIAGNOSIS — E782 Mixed hyperlipidemia: Secondary | ICD-10-CM

## 2021-03-20 DIAGNOSIS — G8929 Other chronic pain: Secondary | ICD-10-CM

## 2021-03-20 DIAGNOSIS — I480 Paroxysmal atrial fibrillation: Secondary | ICD-10-CM

## 2021-03-20 DIAGNOSIS — M159 Polyosteoarthritis, unspecified: Secondary | ICD-10-CM

## 2021-03-20 DIAGNOSIS — I10 Essential (primary) hypertension: Secondary | ICD-10-CM

## 2021-03-20 DIAGNOSIS — I5022 Chronic systolic (congestive) heart failure: Secondary | ICD-10-CM

## 2021-03-20 NOTE — Chronic Care Management (AMB) (Signed)
Chronic Care Management   CCM RN Visit Note  03/20/2021 Name: Danielle [name] MRN: [id number] DOB: October 02, 1945  Subjective: Danielle [name] is a 76 y.o. year old female who is a primary care patient of [name], [name], MD. The care management team was consulted for assistance with disease management and care coordination needs.    Engaged with patient by telephone for follow up visit in response to provider referral for case management and/or care coordination services.   Consent to Services:  The patient was given information about Chronic Care Management services, agreed to services, and gave verbal consent prior to initiation of services.  Please see initial visit note for detailed documentation.   Patient agreed to services and verbal consent obtained.   Assessment: Review of patient past medical history, allergies, medications, health status, including review of consultants reports, laboratory and other test data, was performed as part of comprehensive evaluation and provision of chronic care management services.   SDOH (Social Determinants of Health) assessments and interventions performed:    CCM Care Plan  Allergies  Allergen Reactions   Lisinopril Cough   Tetanus Toxoid Adsorbed Swelling    Arm swelling   Other Cough    PEANUTS    Outpatient Encounter Medications as of 03/20/2021  Medication Sig Note   albuterol (PROVENTIL HFA;VENTOLIN HFA) 108 (90 Base) MCG/ACT inhaler Inhale 2 puffs into the lungs every 6 (six) hours as needed for wheezing or shortness of breath.    alendronate (FOSAMAX) 70 MG tablet Take 1 tablet (70 mg total) by mouth once a week. Take with a full glass of water on an empty stomach.    apixaban (ELIQUIS) 5 MG TABS tablet Take 1 tablet (5 mg total) by mouth 2 (two) times daily.    ascorbic acid (VITAMIN C) 500 MG tablet Take 500 mg by mouth daily.    budesonide-formoterol (SYMBICORT) 160-4.5 MCG/ACT inhaler Inhale 2 puffs into  the lungs daily as needed.    carvedilol (COREG) 12.5 MG tablet Take 1.5 tablets (18.75 mg total) by mouth 2 (two) times daily with a meal.    Cholecalciferol 25 MCG (1000 UT) tablet Take 1,000 Units by mouth daily.    dapagliflozin propanediol (FARXIGA) 10 MG TABS tablet Take 1 tablet (10 mg total) by mouth daily before breakfast.    DULoxetine (CYMBALTA) 30 MG capsule Take 1 capsule (30 mg total) by mouth daily.    fluticasone (FLONASE) 50 MCG/ACT nasal spray Place 1 spray into both nostrils daily.    methimazole (TAPAZOLE) 5 MG tablet TAKE 3 TABLETS BY MOUTH  DAILY    mirtazapine (REMERON) 15 MG tablet Take 1 tablet (15 mg total) by mouth at bedtime.    pantoprazole (PROTONIX) 40 MG tablet Take 1 tablet (40 mg total) by mouth daily.    rosuvastatin (CRESTOR) 40 MG tablet TAKE 1 TABLET BY MOUTH  DAILY    sacubitril-valsartan (ENTRESTO) 49-51 MG Take 1 tablet by mouth 2 (two) times daily.    Semaglutide,0.25 or 0.5MG/DOS, (OZEMPIC, 0.25 OR 0.5 MG/DOSE,) 2 MG/1.5ML SOPN Inject 0.72m subcutaneously weekly for 4 weeks, then increase to 0.570mweekly for 4 weeks    senna-docusate (SENOKOT-S) 8.6-50 MG tablet Take 2 tablets by mouth at bedtime.    spironolactone (ALDACTONE) 25 MG tablet Take 1 tablet (25 mg total) by mouth daily.    terconazole (TERAZOL 7) 0.4 % vaginal cream Place 1 applicator vaginally at bedtime.    torsemide (DEMADEX) 20 MG tablet Take 6033maily alternating with 78m21mery  other day    traMADol (ULTRAM) 50 MG tablet TAKE 1 TABLET BY MOUTH THREE TIMES DAILY 03/17/2021: LAST TAKEN TODAY . NOT HERE FOR COUNT   trolamine salicylate (ASPERCREME) 10 % cream Apply 1 application topically as needed for muscle pain.    valACYclovir (VALTREX) 500 MG tablet Take on tablet by mouth twice a day for 3 days as needed for an outbreak    No facility-administered encounter medications on file as of 03/20/2021.    Patient Active Problem List   Diagnosis Date Noted   Depression, major, single  episode, moderate ([place]) 12/08/2020   Gait abnormality 07/31/2020   Paresthesia 07/31/2020   Low back pain with bilateral sciatica 07/31/2020   Pneumonia due to COVID-19 virus    Bacteremia due to Gram-positive bacteria 08/29/2018   Abscess    Cellulitis of left breast 08/28/2018   Cellulitis of left abdominal wall 08/28/2018   Encounter for routine gynecological examination 04/28/2018   Stage 3 chronic kidney disease ([place]) 09/27/2017   Recurrent genital herpes 04/13/2017   Chronic pain disorder 01/25/2017   Malignant neoplasm of upper-outer quadrant of left breast in female, estrogen receptor positive ([place]) 12/16/2016   Chronic knee pain 12/07/2016   Chronic bilateral low back pain without sciatica 12/07/2016   Peripheral musculoskeletal gait disorder 12/07/2016   Encounter for therapeutic drug monitoring 06/16/2016   Generalized osteoarthritis of multiple sites 05/04/2016   Aortic valve disease 04/10/2016   S/P AVR 04/10/2016   NICM (nonischemic cardiomyopathy) ([place]) 04/10/2016   Upper airway cough syndrome 03/14/2016   Morbid obesity ([place]) 03/14/2016   COPD (chronic obstructive pulmonary disease) ([place]) 03/11/2016   Thrombocytopenia ([place]) 12/12/2015   Anemia 12/12/2015   Vitamin D deficiency 12/12/2015   Acute urinary retention 12/11/2015   Osteoporosis 12/11/2015   Hip fracture ([place]) 12/10/2015   Aortic atherosclerosis ([place]) 12/10/2015   Lung blebs ([place]) 12/03/2015   Pelvic fracture ([place]) 08/19/2015   Fall at home 08/19/2015   Essential hypertension 08/19/2015   Mixed hyperlipidemia 08/19/2015   Asthma 08/19/2015   Paroxysmal atrial fibrillation ([place]) 08/19/2015   Hyperthyroidism 08/19/2015   Chronic HFrEF (heart failure with reduced ejection fraction) (HCC)    Coronary artery disease    Aortic stenosis     Conditions to be addressed/monitored:Atrial Fibrillation, CHF, HTN, HLD, Osteoarthritis, and chronic pain  Care Plan : RN Care Manager Plan of Care  Updates made  by Dimitri Ped, RN since 03/20/2021 12:00 AM     Problem: Chronic Disease Management and Care Coordination Needs (CHF, HTN, Atrial Fib,HLD, chronic pain)   Priority: High     Long-Range Goal: Establish Plan of Care for Chronic Disease Management Needs (CHF, HTN, Atrial Fib, HLD,chronic pain)   Start Date: 01/15/2021  Expected End Date: 07/14/2021  Recent Progress: On track  Priority: High  Note:   Current Barriers:  Chronic Disease Management support and education needs related to Atrial Fibrillation, CHF, HTN, HLD, Osteoarthritis, and chronic pain States that she saw heart failure clinic last week and they cut her Coreg to 1 1/2 tablets twice a day and gave her Entresto samples as she had been out.  States is to get her Delene Loll from pharmacy assistance deliver in a week or so.  States she is still wanting to see if her new insurance will cover the Ozempic he ordered to help her lose weight.   Pt reports weighting daily and was 250 today. Denies any chest pains.  Reports she wears compression hose and keeps her  legs elevated.  Reports shortness of breath with exertion,  Denies any recent atrial fibrillation. States she is eating more vegetables since she was changed to [employer] she is checking her B/P 4-5 times a week and it was 126/84 when she last checked it. Denies any dizziness States she tries to follow a low sodium diet and the fluid restriction that the heart failure clinic gave her.  States she still has has chronic pain in her knees but her left knee is feeling better and she is to get a gel injection in her rt knee .  States she has not been walking but she has been doing leg lifts and squats  RNCM Clinical Goal(s):  Patient will verbalize understanding of plan for management of Atrial Fibrillation, CHF, HTN, HLD, Osteoarthritis, and chronic pain as evidenced by voiced adherence to plan of care verbalize basic understanding of  Atrial Fibrillation, CHF, HTN, HLD,  Osteoarthritis, and chronic pain disease process and self health management plan as evidenced by voiced understanding and teach back take all medications exactly as prescribed and will call provider for medication related questions as evidenced by dispense report and pt verbalization attend all scheduled medical appointments: Labs 03/26/21 HF clinic 04/14/21, Pain clinic 04/30/21, Dr. Saunders Revel 05/20/21 as evidenced by medical records demonstrate Improved adherence to prescribed treatment plan for Atrial Fibrillation, CHF, HTN, HLD, Osteoarthritis, and chronic pain as evidenced by reading within limits, voiced adherence to plan of care continue to work with RN Care Manager to address care management and care coordination needs related to  Atrial Fibrillation, CHF, HTN, HLD, Osteoarthritis, and chronic pain as evidenced by adherence to CM Team Scheduled appointments through collaboration with RN Care manager, provider, and care team.   Interventions: 1:1 collaboration with primary care provider regarding development and update of comprehensive plan of care as evidenced by provider attestation and co-signature Inter-disciplinary care team collaboration (see longitudinal plan of care) Evaluation of current treatment plan related to  self management and patient's adherence to plan as established by provider  AFIB Interventions: (Status:  Goal on track:  Yes.) Long Term Goal   Counseled on increased risk of stroke due to Afib and benefits of anticoagulation for stroke prevention Reviewed importance of adherence to anticoagulant exactly as prescribed Counseled on bleeding risk associated with Eliquis and importance of self-monitoring for signs/symptoms of bleeding Counseled on seeking medical attention after a head injury or if there is blood in the urine/stool   Heart Failure Interventions:  (Status:  Goal on track:  Yes.) Long Term Goal Basic overview and discussion of pathophysiology of Heart Failure  reviewed Provided education on low sodium diet Discussed importance of daily weight and advised patient to weigh and record daily Reviewed role of diuretics in prevention of fluid overload and management of heart failure; Discussed the importance of keeping all appointments with provider Reinforced s/sx of HF to call provider.  Reviewed to not go without her Entresto and to call HF clinic if she has any issues with getting medication  Hyperlipidemia Interventions:  (Status:  Condition stable.  Not addressed this visit.) Long Term Goal Medication review performed; medication list updated in electronic medical record.  Counseled on importance of regular laboratory monitoring as prescribed Reviewed role and benefits of statin for ASCVD risk reduction Reviewed importance of limiting foods high in cholesterol  Hypertension Interventions:  (Status:  Goal on track:  Yes.) Long Term Goal Last practice recorded BP readings:  BP Readings from Last 3 Encounters:  03/17/21 126/84  03/13/21 110/70  02/10/21 110/70  Most recent eGFR/CrCl:  Lab Results  Component Value Date   EGFR 55 (L) 12/22/2016    No components found for: CRCL  Evaluation of current treatment plan related to hypertension self management and patient's adherence to plan as established by provider Provided education to patient re: stroke prevention, s/s of heart attack and stroke Reviewed medications with patient and discussed importance of compliance Discussed plans with patient for ongoing care management follow up and provided patient with direct contact information for care management team Advised patient, providing education and rationale, to monitor blood pressure daily and record, calling PCP for findings outside established parameters Provided education on prescribed diet low sodium heart healthy  Pain Interventions:  (Status:  Goal on track:  Yes.) Long Term Goal Pain assessment performed Medications reviewed Reviewed  provider established plan for pain management Discussed importance of adherence to all scheduled medical appointments Counseled on the importance of reporting any/all new or changed pain symptoms or management strategies to pain management provider Advised patient to report to care team affect of pain on daily activities Discussed use of relaxation techniques and/or diversional activities to assist with pain reduction (distraction, imagery, relaxation, massage, acupressure, TENS, heat, and cold application Reviewed to continue to do leg exercises     Patient Goals/Self-Care Activities: Take all medications as prescribed Attend all scheduled provider appointments Call pharmacy for medication refills 3-7 days in advance of running out of medications Call provider office for new concerns or questions  call office if I gain more than 2 pounds in one day or 5 pounds in one week keep legs up while sitting track weight in diary use salt in moderation watch for swelling in feet, ankles and legs every day follow rescue plan if symptoms flare-up check blood pressure daily choose a place to take my blood pressure (home, clinic or office, retail store) take blood pressure log to all doctor appointments take medications for blood pressure exactly as prescribed call for medicine refill 2 or 3 days before it runs out take all medications exactly as prescribed call doctor with any symptoms you believe are related to your medicine - learn how to meditate - learn relaxation techniques - practice acceptance of chronic pain - practice relaxation or meditation daily - tell myself I can (not I can't) - think of new ways to do favorite things - use distraction techniques - use relaxation during pain Follow Up Plan:  Telephone follow up appointment with care management team member scheduled for:  3/17//23 The patient has been provided with contact information for the care management team and has been  advised to call with any health related questions or concerns.        Plan:Telephone follow up appointment with care management team member scheduled for:  05/12/21 The patient has been provided with contact information for the care management team and has been advised to call with any health related questions or concerns.  Peter Garter RN, Jackquline [place], CDE Care Management Coordinator [facility] [phone], Mobile [phone]

## 2021-03-20 NOTE — Patient Instructions (Signed)
Visit Information  Thank you for taking time to visit with me today. Please don't hesitate to contact me if I can be of assistance to you before our next scheduled telephone appointment.  Following are the goals we discussed today:  Take all medications as prescribed Attend all scheduled provider appointments Call pharmacy for medication refills 3-7 days in advance of running out of medications Call provider office for new concerns or questions  call office if I gain more than 2 pounds in one day or 5 pounds in one week keep legs up while sitting track weight in diary use salt in moderation watch for swelling in feet, ankles and legs every day follow rescue plan if symptoms flare-up check blood pressure daily choose a place to take my blood pressure (home, clinic or office, retail store) take blood pressure log to all doctor appointments take medications for blood pressure exactly as prescribed call for medicine refill 2 or 3 days before it runs out take all medications exactly as prescribed call doctor with any symptoms you believe are related to your medicine - learn how to meditate - learn relaxation techniques - practice acceptance of chronic pain - practice relaxation or meditation daily - tell myself I can (not I can't) - think of new ways to do favorite things - use distraction techniques - use relaxation during pain Our next appointment is by telephone on 05/15/21 at 3 PM  Please call the care guide team at [phone] if you need to cancel or reschedule your appointment.   If you are experiencing a Mental Health or [place] or need someone to talk to, please call the Suicide and Crisis Lifeline: 988 call the [place] National Suicide Prevention Lifeline: 639-618-0022 or TTY: 270-473-2066 TTY 986-088-2004) to talk to a trained counselor call 1-800-273-TALK (toll free, 24 hour hotline) go to [facility] [address]  [place] [phone]) call 911   Patient verbalizes understanding of instructions and care plan provided today and agrees to view in [place]. Active MyChart status confirmed with patient.   Peter Garter RN, Jackquline [place], CDE Care Management Coordinator [facility] [phone], Mobile [phone]

## 2021-03-21 LAB — DRUG TOX MONITOR 1 W/CONF, ORAL FLD

## 2021-03-21 LAB — DRUG TOX ALC METAB W/CON, ORAL FLD: Alcohol Metabolite: NEGATIVE ng/mL (ref <25)

## 2021-03-23 ENCOUNTER — Other Ambulatory Visit (HOSPITAL_COMMUNITY): Payer: Self-pay

## 2021-03-25 ENCOUNTER — Telehealth: Payer: Self-pay | Admitting: Pharmacist

## 2021-03-25 ENCOUNTER — Other Ambulatory Visit (HOSPITAL_COMMUNITY): Payer: Self-pay

## 2021-03-25 MED ORDER — OZEMPIC (0.25 OR 0.5 MG/DOSE) 2 MG/1.5ML ~~LOC~~ SOPN: PEN_INJECTOR | SUBCUTANEOUS | 1 refills | Status: DC

## 2021-03-25 NOTE — Telephone Encounter (Signed)
They do, I spoke with them about it 2 weeks ago when I checked to see if pt had brought in her new insurance yet. Rx was sent in on 1/4 for a month supply with a refill.  Will resend in again.

## 2021-03-25 NOTE — Telephone Encounter (Signed)
Received message that pt needed refill on Ozempic. She already has rx at her pharmacy and was advised earlier this month to bring her new insurance information to the pharmacy so they could rerun the rx and see if it was cheaper this year compared to last.   Left message for pt relaying the above information.

## 2021-03-25 NOTE — Telephone Encounter (Signed)
Patient is calling back to the pharmacy states they dont have a prescription for it. Please advise

## 2021-03-25 NOTE — Addendum Note (Signed)
Addended by: Resa Rinks E on: 03/25/2021 04:09 PM   Modules accepted: Orders

## 2021-03-26 ENCOUNTER — Other Ambulatory Visit (HOSPITAL_COMMUNITY): Payer: Self-pay

## 2021-03-26 ENCOUNTER — Other Ambulatory Visit: Payer: Self-pay

## 2021-03-26 ENCOUNTER — Ambulatory Visit (HOSPITAL_COMMUNITY)
Admission: RE | Admit: 2021-03-26 | Discharge: 2021-03-26 | Disposition: A | Discharge status: Home / Self Care | Payer: Medicare HMO | Source: Ambulatory Visit | Attending: Internal Medicine | Admitting: Internal Medicine

## 2021-03-26 DIAGNOSIS — I5022 Chronic systolic (congestive) heart failure: Secondary | ICD-10-CM | POA: Insufficient documentation

## 2021-03-26 LAB — BASIC METABOLIC PANEL
Anion gap: 5 (ref 5–15)
BUN: 9 mg/dL (ref 8–23)
CO2: 24 mmol/L (ref 22–32)
Calcium: 9.8 mg/dL (ref 8.9–10.3)
Chloride: 111 mmol/L (ref 98–111)
Creatinine, Ser: 1.43 mg/dL — ABNORMAL HIGH (ref 0.44–1.00)
GFR, Estimated: 38 mL/min — ABNORMAL LOW (ref >60)
Glucose, Bld: 104 mg/dL — ABNORMAL HIGH (ref 70–99)
Potassium: 4.9 mmol/L (ref 3.5–5.1)
Sodium: 140 mmol/L (ref 135–145)

## 2021-03-26 MED ORDER — TORSEMIDE 20 MG PO TABS: ORAL_TABLET | ORAL | 2 refills | Status: DC

## 2021-03-26 MED ORDER — APIXABAN 5 MG PO TABS: 5.0000 mg | ORAL_TABLET | Freq: Two times a day (BID) | ORAL | 3 refills | Status: DC

## 2021-03-26 MED ORDER — ROSUVASTATIN CALCIUM 40 MG PO TABS: 40.0000 mg | ORAL_TABLET | Freq: Every day | ORAL | 3 refills | Status: DC

## 2021-03-26 MED ORDER — CARVEDILOL 12.5 MG PO TABS: 18.7500 mg | ORAL_TABLET | Freq: Two times a day (BID) | ORAL | 3 refills | Status: DC

## 2021-03-26 MED ORDER — SPIRONOLACTONE 25 MG PO TABS: 25.0000 mg | ORAL_TABLET | Freq: Every day | ORAL | 3 refills | Status: DC

## 2021-03-30 ENCOUNTER — Other Ambulatory Visit (HOSPITAL_COMMUNITY): Payer: Self-pay | Admitting: *Deleted

## 2021-03-30 ENCOUNTER — Telehealth (HOSPITAL_COMMUNITY): Payer: Self-pay | Admitting: *Deleted

## 2021-03-30 NOTE — Telephone Encounter (Signed)
Pt left vm requesting refill of eliquis. Med was sent to [facility] on 1/26. I called pt to make sure this was the correct pharmacy no answer.

## 2021-03-31 ENCOUNTER — Telehealth: Payer: Self-pay | Admitting: *Deleted

## 2021-03-31 ENCOUNTER — Telehealth: Payer: Self-pay | Admitting: Family Medicine

## 2021-03-31 DIAGNOSIS — M159 Polyosteoarthritis, unspecified: Secondary | ICD-10-CM | POA: Diagnosis not present

## 2021-03-31 DIAGNOSIS — I5022 Chronic systolic (congestive) heart failure: Secondary | ICD-10-CM | POA: Diagnosis not present

## 2021-03-31 DIAGNOSIS — I480 Paroxysmal atrial fibrillation: Secondary | ICD-10-CM

## 2021-03-31 DIAGNOSIS — E782 Mixed hyperlipidemia: Secondary | ICD-10-CM

## 2021-03-31 DIAGNOSIS — I10 Essential (primary) hypertension: Secondary | ICD-10-CM | POA: Diagnosis not present

## 2021-03-31 DIAGNOSIS — E0590 Thyrotoxicosis, unspecified without thyrotoxic crisis or storm: Secondary | ICD-10-CM

## 2021-03-31 MED ORDER — METHIMAZOLE 5 MG PO TABS: 15.0000 mg | ORAL_TABLET | Freq: Every day | ORAL | 3 refills | Status: DC

## 2021-03-31 NOTE — Telephone Encounter (Signed)
Pt has new insurance and no longer uses [facility] and needs new rx methimazole (TAPAZOLE) 5 MG tablet . Pt takes 3 tablets daily send to  [facility] - [place], [name] Phone:  [phone]  Fax:  [phone]

## 2021-03-31 NOTE — Telephone Encounter (Signed)
Rx sent in

## 2021-03-31 NOTE — Telephone Encounter (Signed)
Oral swab drug screen was consistent for prescribed medications.  ?

## 2021-04-08 ENCOUNTER — Telehealth: Payer: Self-pay | Admitting: Pharmacist

## 2021-04-08 NOTE — Chronic Care Management (AMB) (Signed)
Chronic Care Management Pharmacy Assistant   Name: Danielle [name]  MRN: [id number] DOB: Jan 15, 1946  Reason for Encounter: Disease State / Hypertension Assessment Call   Conditions to be addressed/monitored: HTN  Recent office visits:  None  Recent consult visits:  03/17/2021 [name] MD (physical medicine and rehabilitation) - Patient was seen for Encounter for long-term use of opiate analgesic and additional issues. No follow up noted.   Hospital visits:  None  Medications: Outpatient Encounter Medications as of 04/08/2021  Medication Sig Note   albuterol (PROVENTIL HFA;VENTOLIN HFA) 108 (90 Base) MCG/ACT inhaler Inhale 2 puffs into the lungs every 6 (six) hours as needed for wheezing or shortness of breath.    alendronate (FOSAMAX) 70 MG tablet Take 1 tablet (70 mg total) by mouth once a week. Take with a full glass of water on an empty stomach.    apixaban (ELIQUIS) 5 MG TABS tablet Take 1 tablet (5 mg total) by mouth 2 (two) times daily.    ascorbic acid (VITAMIN C) 500 MG tablet Take 500 mg by mouth daily.    budesonide-formoterol (SYMBICORT) 160-4.5 MCG/ACT inhaler Inhale 2 puffs into the lungs daily as needed.    carvedilol (COREG) 12.5 MG tablet Take 1.5 tablets (18.75 mg total) by mouth 2 (two) times daily with a meal.    Cholecalciferol 25 MCG (1000 UT) tablet Take 1,000 Units by mouth daily.    dapagliflozin propanediol (FARXIGA) 10 MG TABS tablet Take 1 tablet (10 mg total) by mouth daily before breakfast.    DULoxetine (CYMBALTA) 30 MG capsule Take 1 capsule (30 mg total) by mouth daily.    fluticasone (FLONASE) 50 MCG/ACT nasal spray Place 1 spray into both nostrils daily.    methimazole (TAPAZOLE) 5 MG tablet Take 3 tablets (15 mg total) by mouth daily.    mirtazapine (REMERON) 15 MG tablet Take 1 tablet (15 mg total) by mouth at bedtime.    pantoprazole (PROTONIX) 40 MG tablet Take 1 tablet (40 mg total) by mouth daily.    rosuvastatin  (CRESTOR) 40 MG tablet Take 1 tablet (40 mg total) by mouth daily.    sacubitril-valsartan (ENTRESTO) 49-51 MG Take 1 tablet by mouth 2 (two) times daily.    Semaglutide,0.25 or 0.5MG /DOS, (OZEMPIC, 0.25 OR 0.5 MG/DOSE,) 2 MG/1.5ML SOPN Inject 0.25mg  subcutaneously weekly for 4 weeks, then increase to 0.5mg  weekly for 4 weeks    senna-docusate (SENOKOT-S) 8.6-50 MG tablet Take 2 tablets by mouth at bedtime.    spironolactone (ALDACTONE) 25 MG tablet Take 1 tablet (25 mg total) by mouth daily.    terconazole (TERAZOL 7) 0.4 % vaginal cream Place 1 applicator vaginally at bedtime.    torsemide (DEMADEX) 20 MG tablet Take 60mg  daily alternating with 40mg  every other day    traMADol (ULTRAM) 50 MG tablet TAKE 1 TABLET BY MOUTH THREE TIMES DAILY 03/17/2021: LAST TAKEN TODAY . NOT HERE FOR COUNT   trolamine salicylate (ASPERCREME) 10 % cream Apply 1 application topically as needed for muscle pain.    valACYclovir (VALTREX) 500 MG tablet Take on tablet by mouth twice a day for 3 days as needed for an outbreak    No facility-administered encounter medications on file as of 04/08/2021.  Fill History: Eliquis 5 mg tablet 03/26/2021 90   carvedilol 12.5 mg tablet 03/26/2021 90   DULoxetine 30MG      CAP 12/09/2020 30   FARXIGA 10MG  TAB 03/16/2021 30   MIRTAZAPINE 15MG     TAB 12/24/2020 30  pantoprazole 40 mg tablet,delayed release 03/28/2021 30   rosuvastatin 40 mg tablet 03/26/2021 90   ENTRESTO 49-51MG     TAB 03/13/2021 30   SPIRONOLACTONE 25MG     TAB 03/16/2021 90   torsemide 20 mg tablet 03/26/2021 90   WARFARIN 3MG         TAB 11/24/2020 90   tramadol 50 mg tablet 03/28/2021 30   Reviewed chart prior to disease state call. Spoke with patient regarding BP  Recent Office Vitals: BP Readings from Last 3 Encounters:  03/17/21 126/84  03/13/21 110/70  02/10/21 110/70   Pulse Readings from Last 3 Encounters:  03/17/21 82  03/13/21 82  02/10/21 72    Wt Readings from Last 3  Encounters:  03/17/21 250 lb (113.4 kg)  03/13/21 257 lb 3.2 oz (116.7 kg)  02/10/21 251 lb (113.9 kg)     Kidney Function Lab Results  Component Value Date/Time   CREATININE 1.43 (H) 03/26/2021 09:05 AM   CREATININE 1.35 (H) 03/13/2021 09:20 AM   CREATININE 1.40 (H) 11/21/2020 09:34 AM   CREATININE 1.64 (H) 10/29/2020 10:10 AM   CREATININE 1.09 (H) 12/31/2019 11:55 AM   CREATININE 1.2 (H) 12/22/2016 08:50 AM   GFR 57.35 (L) 01/04/2018 12:32 PM   GFRNONAA 38 (L) 03/26/2021 09:05 AM   GFRNONAA 39 (L) 11/21/2020 09:34 AM   GFRNONAA 50 (L) 12/31/2019 11:55 AM   GFRAA 57 (L) 02/27/2020 09:24 AM   GFRAA 58 (L) 12/31/2019 11:55 AM    BMP Latest Ref Rng & Units 03/26/2021 03/13/2021 02/19/2021  Glucose 70 - 99 mg/dL 104(H) 90 92  BUN 8 - 23 mg/dL 9 16 25(H)  Creatinine 0.44 - 1.00 mg/dL 1.43(H) 1.35(H) 2.09(H)  BUN/Creat Ratio 12 - 28 - - -  Sodium 135 - 145 mmol/L 140 140 135  Potassium 3.5 - 5.1 mmol/L 4.9 4.3 5.4(H)  Chloride 98 - 111 mmol/L 111 110 107  CO2 22 - 32 mmol/L 24 23 19(L)  Calcium 8.9 - 10.3 mg/dL 9.8 9.7 10.2    Current antihypertensive regimen:  Carvedilol 12.5 mg 1.5 tablets twice daily Entestro 49 - 51 mg take 1 tablet twice daily  How often are you checking your Blood Pressure?   Current home BP readings:   What recent interventions/DTPs have been made by any provider to improve Blood Pressure control since last CPP Visit: No recent interventions.   Any recent hospitalizations or ED visits since last visit with CPP? No recent hospital visits.   What diet changes have been made to improve Blood Pressure Control?    What exercise is being done to improve your Blood Pressure Control?    Adherence Review: Is the patient currently on ACE/ARB medication? No Does the patient have >5 day gap between last estimated fill dates? No  Unable to reach patient after several attempts Care Gaps: AWV - scheduled for 02/01/2022 Last BP - 126/84 on 03/17/2021 Last  A1C - 5.1 on 12/08/2020 Colonoscopy - never done Covid-19 vaccine - overdue   Star Rating Drugs: Farxiga 10mg  - patient assistance [place] Rosuvastatin 40mg  - last filled on 03/26/2021 90DS at [place] [phone]

## 2021-04-13 ENCOUNTER — Ambulatory Visit (INDEPENDENT_AMBULATORY_CARE_PROVIDER_SITE_OTHER): Payer: Medicare HMO | Admitting: Family Medicine

## 2021-04-13 ENCOUNTER — Encounter: Payer: Self-pay | Admitting: Family Medicine

## 2021-04-13 VITALS — BP 130/80 | HR 77 | Temp 98.4°F | Resp 16 | Ht 67.0 in

## 2021-04-13 DIAGNOSIS — E0590 Thyrotoxicosis, unspecified without thyrotoxic crisis or storm: Secondary | ICD-10-CM | POA: Diagnosis not present

## 2021-04-13 DIAGNOSIS — G4700 Insomnia, unspecified: Secondary | ICD-10-CM | POA: Diagnosis not present

## 2021-04-13 DIAGNOSIS — R197 Diarrhea, unspecified: Secondary | ICD-10-CM | POA: Diagnosis not present

## 2021-04-13 LAB — TSH: TSH: 0.63 u[IU]/mL (ref 0.35–5.50)

## 2021-04-13 MED ORDER — ZOLPIDEM TARTRATE 5 MG PO TABS: 2.5000 mg | ORAL_TABLET | Freq: Every evening | ORAL | 0 refills | Status: DC | PRN

## 2021-04-13 NOTE — Patient Instructions (Addendum)
A few things to remember from today's visit:  Hyperthyroidism - Plan: TSH  Insomnia, unspecified type - Plan: zolpidem (AMBIEN) 5 MG tablet  Diarrhea, unspecified type - Plan: Stool culture, Ova and parasite examination, Basic metabolic panel, TSH  If you need refills please call your pharmacy. Do not use My Chart to request refills or for acute issues that need immediate attention.   Adequate hydration. Consider having colonoscopy. We will evaluate for possible causes of diarrhea. Ambien 1/2 to 1 tab max at bedtime to help with sleep. Follow up in 4-6 weeks.  Please be sure medication list is accurate. If a new problem present, please set up appointment sooner than planned today.

## 2021-04-13 NOTE — Progress Notes (Signed)
ACUTE VISIT Chief Complaint  Patient presents with   Diarrhea    X 2 weeks, comes and goes. Growling sound more than pain, worse when drinking water.   HPI: [name] is a 76 y.o. female with hx of CHF, hyperthyroidism,breast cancer,CKD III,paroxysmal atrial fib on chronic anticoagulation, and aortic valve disease s/p AVR here today with above complaint. Denies having any associated abdominal pain.   Loose stool for 2 weeks, she has had problem before intermittent but daily for the past 2 weeks. At least 3 stools x days. She has not noted blood or mucus. No sick contact or recent travel. No dietary changes or abx treatment.  She takes probiotic daily. She started [place] since her last visit. Ozempic was prescribed for wt loss but she has not started it.  She has not identified exacerbating or alleviating factors.  Diarrhea  This is a new problem. The current episode started 1 to 4 weeks ago. The problem has been unchanged. The patient states that diarrhea does not awaken her from sleep. Pertinent negatives include no abdominal pain, bloating, chills, coughing, fever, headaches, increased  flatus or myalgias. Nothing aggravates the symptoms. There are no known risk factors. She has tried nothing for the symptoms. There is no history of bowel resection, inflammatory bowel disease or malabsorption.   Sometimes heartburn. She was planning on having colonoscopy but because HR was 35/min , so recommended colonoscopy as inpt. She wonders if she can have cologuard. Lab Results  Component Value Date   WBC 6.3 11/21/2020   HGB 10.9 (L) 11/21/2020   HCT 33.6 (L) 11/21/2020   MCV 96.3 11/21/2020   PLT 70 (L) 11/21/2020   Hyperthyroidism on Methimazole 5 mg 3 tabs daily. Lab Results  Component Value Date   TSH 1.146 12/17/2020   Lab Results  Component Value Date   CREATININE 1.43 (H) 03/26/2021   BUN 9 03/26/2021   NA 140 03/26/2021   K 4.9 03/26/2021   CL  111 03/26/2021   CO2 24 03/26/2021   C/O persistent insomnia, last visit Mirtazapine 30 mg 1/2 tan was added, she took the whole tab and it did not helped at all. Sleeping 3-4 hours. She is not longer taking Duloxetine.  Review of Systems  Constitutional:  Negative for activity change, appetite change, chills and fever.  Respiratory:  Negative for cough, shortness of breath and wheezing.   Cardiovascular:  Negative for chest pain and palpitations.  Gastrointestinal:  Positive for diarrhea. Negative for abdominal pain, bloating and flatus.  Endocrine: Negative for cold intolerance and heat intolerance.  Genitourinary:  Negative for decreased urine volume, dysuria and hematuria.  Musculoskeletal:  Negative for myalgias.  Neurological:  Negative for headaches.  Rest see pertinent positives and negatives per HPI.  Current Outpatient Medications on File Prior to Visit  Medication Sig Dispense Refill   albuterol (PROVENTIL HFA;VENTOLIN HFA) 108 (90 Base) MCG/ACT inhaler Inhale 2 puffs into the lungs every 6 (six) hours as needed for wheezing or shortness of breath.     alendronate (FOSAMAX) 70 MG tablet Take 1 tablet (70 mg total) by mouth once a week. Take with a full glass of water on an empty stomach. 13 tablet 3   apixaban (ELIQUIS) 5 MG TABS tablet Take 1 tablet (5 mg total) by mouth 2 (two) times daily. 180 tablet 3   ascorbic acid (VITAMIN C) 500 MG tablet Take 500 mg by mouth daily.     budesonide-formoterol (SYMBICORT) 160-4.5 MCG/ACT inhaler Inhale 2  puffs into the lungs daily as needed. 3 each 3   carvedilol (COREG) 12.5 MG tablet Take 1.5 tablets (18.75 mg total) by mouth 2 (two) times daily with a meal. 90 tablet 3   Cholecalciferol 25 MCG (1000 UT) tablet Take 1,000 Units by mouth daily.     dapagliflozin propanediol (FARXIGA) 10 MG TABS tablet Take 1 tablet (10 mg total) by mouth daily before breakfast. 30 tablet 11   fluticasone (FLONASE) 50 MCG/ACT nasal spray Place 1 spray  into both nostrils daily. 16 g 3   methimazole (TAPAZOLE) 5 MG tablet Take 3 tablets (15 mg total) by mouth daily. 270 tablet 3   pantoprazole (PROTONIX) 40 MG tablet Take 1 tablet (40 mg total) by mouth daily. 30 tablet 11   rosuvastatin (CRESTOR) 40 MG tablet Take 1 tablet (40 mg total) by mouth daily. 90 tablet 3   sacubitril-valsartan (ENTRESTO) 49-51 MG Take 1 tablet by mouth 2 (two) times daily. 60 tablet 4   senna-docusate (SENOKOT-S) 8.6-50 MG tablet Take 2 tablets by mouth at bedtime.     spironolactone (ALDACTONE) 25 MG tablet Take 1 tablet (25 mg total) by mouth daily. 90 tablet 3   terconazole (TERAZOL 7) 0.4 % vaginal cream Place 1 applicator vaginally at bedtime. 45 g 0   torsemide (DEMADEX) 20 MG tablet Take 60mg  daily alternating with 40mg  every other day 420 tablet 2   traMADol (ULTRAM) 50 MG tablet TAKE 1 TABLET BY MOUTH THREE TIMES DAILY 90 tablet 5   trolamine salicylate (ASPERCREME) 10 % cream Apply 1 application topically as needed for muscle pain.     valACYclovir (VALTREX) 500 MG tablet Take on tablet by mouth twice a day for 3 days as needed for an outbreak 30 tablet 0   No current facility-administered medications on file prior to visit.   Past Medical History:  Diagnosis Date   Anticoagulated on Coumadin    managed by cardiology   Anxiety    Chronic constipation    Chronic systolic CHF (congestive heart failure) ([place]) 2016   cardiologist--- dr end and followed by CHF clinic   CKD (chronic kidney disease), stage III ([place])    COPD (chronic obstructive pulmonary disease) ([place])    previous had seen pulmonology --- dr Melvyn Novas, note in epic 03-14-2016, dx COPD GOLD 0   Coronary artery disease    cardiologist--- dr Durward Mallard;  last cardiac cath 01-10-20202 mild nonobstructive disease proxLAD;  per pt, had LHC prior to AVR in [place] 2016   DOE (dyspnea on exertion)    GERD (gastroesophageal reflux disease)    History of 2019 novel coronavirus disease (COVID-19) 12/19/2019    hospital admission w/ covid pneumonia , no intubatiion,  per pt symptoms resolved and back to baseline   History of cerebrovascular accident (CVA) with residual deficit    CVA in 2000 without residuals;  CVA post op AVR 01/ 2016 with residual right hand weakness   History of gastritis    History of pneumothorax    s/p right vats 11-19-2014 and  s/p left vats  12-13-2015   (both spontenaous due to bleb)   Hyperlipidemia    Hypertension    Hyperthyroidism    per pt followed by pcp,  dx approx 2014   Malignant neoplasm of upper-outer quadrant of left breast in female, estrogen receptor positive ([place]) 11/2016   oncologist-- dr Burr Medico--- dx 10/ 2018  ,  s/p left lumptectomy with node dissection;  completed radiation 04-15-2017, no chemo  Multiple thyroid nodules    in care everywhere pt had left thyroid nodule biospy 09-18-2014 benign   NICM (nonischemic cardiomyopathy) ([place]) 2016   2016  ef 30%;  last echo in epic ef 45%   OA (osteoarthritis)    Osteoporosis    Paroxysmal atrial fibrillation [facility])    cardiologist--- dr end   Personal history of radiation therapy    left breast cancer  03-08-2017  to 04-05-2017   Thrombocytopenia [facility])    followed by dr Burr Medico   Valvular stenosis, aortic 03/12/2014   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   Wears dentures    full upper   Allergies  Allergen Reactions   Lisinopril Cough   Tetanus Toxoid Adsorbed Swelling    Arm swelling   Other Cough    PEANUTS    Social History   Socioeconomic History   Marital status: Married    Spouse name: Sherwood   Number of children: 0   Years of education: 12   Highest education level: Bachelor's degree (e.g., BA, AB, BS)  Occupational History   Occupation: Retired in 2004  Tobacco Use   Smoking status: Former    Packs/day: 0.25    Years: 10.00    Pack years: 2.50    Types: Cigarettes    Quit date: 2012    Years since quitting: 11.1   Smokeless tobacco: Never  Vaping Use    Vaping Use: Never used  Substance and Sexual Activity   Alcohol use: No   Drug use: Never   Sexual activity: Yes    Birth control/protection: Post-menopausal  Other Topics Concern   Not on file  Social History Narrative   Lives with husband.  Ambulated independently.   Right-handed.   No daily caffeine use.   Social Determinants of Health   Financial Resource Strain: Low Risk    Difficulty of Paying Living Expenses: Not very hard  Food Insecurity: No Food Insecurity   Worried About Charity fundraiser in the Last Year: Never true   Ran Out of Food in the Last Year: Never true  Transportation Needs: No Transportation Needs   Lack of Transportation (Medical): No   Lack of Transportation (Non-Medical): No  Physical Activity: Insufficiently Active   Days of Exercise per Week: 1 day   Minutes of Exercise per Session: 10 min  Stress: No Stress Concern Present   Feeling of Stress : Not at all  Social Connections: Moderately Integrated   Frequency of Communication with Friends and Family: More than three times a week   Frequency of Social Gatherings with Friends and Family: Once a week   Attends Religious Services: More than 4 times per year   Active Member of Clubs or Organizations: No   Attends Archivist Meetings: Never   Marital Status: Married   Vitals:   04/13/21 0856  BP: 130/80  Pulse: 77  Resp: 16  Temp: 98.4 F (36.9 C)  SpO2: 92%   Body mass index is 39.16 kg/m.  Physical Exam Vitals and nursing note reviewed.  Constitutional:      General: She is not in acute distress.    Appearance: She is well-developed. She is not ill-appearing.  HENT:     Head: Normocephalic and atraumatic.     Mouth/Throat:     Mouth: Mucous membranes are moist.  Eyes:     General: No scleral icterus.    Conjunctiva/sclera: Conjunctivae normal.  Cardiovascular:  Rate and Rhythm: Normal rate and regular rhythm. Occasional Extrasystoles are present.    Heart sounds: No  murmur heard.    Comments: LE lymphedema, bilateral. Pulmonary:     Effort: Pulmonary effort is normal. No respiratory distress.     Breath sounds: Normal breath sounds.  Abdominal:     General: There is no distension.     Palpations: Abdomen is soft. There is no mass.     Tenderness: There is no abdominal tenderness.  Lymphadenopathy:     Cervical: No cervical adenopathy.     Upper Body:     Right upper body: No supraclavicular adenopathy.     Left upper body: No supraclavicular adenopathy.  Skin:    General: Skin is warm.     Findings: No erythema or rash.  Neurological:     General: No focal deficit present.     Mental Status: She is alert and oriented to person, place, and time.     Comments: In a wheel chair.  Psychiatric:        Mood and Affect: Mood is anxious.     Comments: Well groomed, good eye contact.   ASSESSMENT AND PLAN:  [name] was seen today for diarrhea.  Diagnoses and all orders for this visit: Orders Placed This Encounter  Procedures   Stool culture   Ova and parasite examination   Basic metabolic panel   TSH   Lab Results  Component Value Date   CREATININE 1.52 (H) 04/13/2021   BUN 20 04/13/2021   NA 140 04/13/2021   K 4.8 04/13/2021   CL 108 04/13/2021   CO2 27 04/13/2021   Lab Results  Component Value Date   TSH 0.63 04/13/2021   Insomnia, unspecified type She has tried Trazodone and Remeron and neither one help. We reviewed possible etiologies. Good sleep hygiene is important. After discussion of some side effects, she agrees with trying low dose Ambien, starting with 2.5 mg and can increase to max 5 mg if needed.  -     zolpidem (AMBIEN) 5 MG tablet; Take 0.5-1 tablets (2.5-5 mg total) by mouth at bedtime as needed for sleep.  Diarrhea, unspecified type No associated abdominal pain. Adequate hydration. Continue daily probiotic. Recommend having colonoscopy instead cologuard.She saw her GI 01/2021. Instructed about warning  signs. Further recommendations according to lab results (blood work and stool examination).  Hyperthyroidism Problem has been well controlled. Continue Methimazole same dose. Further recommendations according to TSH results.  Return in about 5 weeks (around 05/18/2021) for Insomnia.  Leasa Kincannon G. [name], MD  [facility]. [place] office.

## 2021-04-13 NOTE — Progress Notes (Signed)
PCP: [name], [name], MD Cardiology: Dr. Saunders Revel HF cardiology: Dr. Aundra Dubin  76 y.o. with history of aortic insufficiency s/p bioprosthetic aortic valve and CHF was referred by Dr End for evaluation of CHF.  Patient had bioprosthetic aortic valve replacement for aortic insufficiency in 1/16 at [facility] in [place], [place].  Since that time, she has had mild to moderately reduced LV systolic function, as low as 30-35% and as high at 45-50%.  Most recent echo in 9/21 showed EF 45-50%.  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  She has had paroxysmal atrial fibrillation and 2 prior CVAs.  She is on warfarin.  She has had breast cancer treated with lumpectomy and radiation, no chemotherapy.  She has history of hyperthyroidism and is on methimazole.  She has controlled HTN.    Cardiac MRI (10/22) with LV EF 41%, normal RV, subendocardial LGE in the mid-apical inferior wall and the mid-apical inferolateral wall.   Echo 12/22, very technically difficult study with poor images, EF around 35%, RV poorly visualized, the bioprosthetic aortic valve looked ok.    Today she returns for HF follow up with her son. Overall feeling fine. She is able to get around her house and walk on flat ground with her walker without significant dyspnea. Her knee OA prevents her from going up stairs.  She has chronic 2 pillow orthopnea. Denies palpitations, CP, dizziness, edema, or abnormal bleeding. Appetite ok. No fever or chills. Weight at home 250 pounds. She is taking all of her medications.   REDS clip 35%  Labs (5/22): K 4.9, creatinine 1.21 Labs (9/22): K 4.9, creatinine 1.4 Labs (10/22): K 3.4, creatinine 1.8, TSH normal Labs (12/22): K 5.4, creatinine 2.09 Labs (1/23): K 4.9, creatinine 1.43  ECG (personally reviewed): none ordered today.  PMH: 1. Left breast cancer: Treated with lumpectomy and radiation, no chemotherapy.  2. Hyperthyroidism 3. Aortic insufficiency: Severe, found when she lived in  [place].  She had bioprosthetic aortic valve replacement at [facility] in 1/16.  4. Atrial fibrillation: Paroxysmal. 5. CVA x 2 6. HTN 7. Hyperlipidemia 8. Chronic systolic CHF: Nonischemic cardiomyopathy.  - Echo (3/16): EF 45-50% - Echo (1/17): EF 30-35% - Echo (2/18): EF 35-40% - Echo (11/20): EF 35-40% - Echo (9/21): EF 45-50%, global hypokinesis, mild LVH, bioprosthetic aortic valve mean gradient 10 mmHg, normal RV - RHC/LHC (1/22): mean RA 9, PA 35/20, mean PCWP 21, CI 3.2.  Nonobstructive CAD.  - cMRI (10/22): LV EF 41%, normal RV, subendocardial LGE in the mid-apical inferior wall and the mid-apical inferolateral wall. - Echo (12/22): Very technically difficult study with poor images, EF around 35%, RV poorly visualized, the bioprosthetic aortic valve looked ok. 9. H/o spontaneous PTX  Social History   Socioeconomic History   Marital status: Married    Spouse name: Sherwood   Number of children: 0   Years of education: 12   Highest education level: Bachelor's degree (e.g., BA, AB, BS)  Occupational History   Occupation: Retired in 2004  Tobacco Use   Smoking status: Former    Packs/day: 0.25    Years: 10.00    Pack years: 2.50    Types: Cigarettes    Quit date: 2012    Years since quitting: 11.1   Smokeless tobacco: Never  Vaping Use   Vaping Use: Never used  Substance and Sexual Activity   Alcohol use: No   Drug use: Never   Sexual activity: Yes    Birth control/protection: Post-menopausal  Other Topics Concern   Not on file  Social History Narrative   Lives with husband.  Ambulated independently.   Right-handed.   No daily caffeine use.   Social Determinants of Health   Financial Resource Strain: Low Risk    Difficulty of Paying Living Expenses: Not very hard  Food Insecurity: No Food Insecurity   Worried About Charity fundraiser in the Last Year: Never true   Ran Out of Food in the Last Year: Never true  Transportation Needs: No  Transportation Needs   Lack of Transportation (Medical): No   Lack of Transportation (Non-Medical): No  Physical Activity: Insufficiently Active   Days of Exercise per Week: 1 day   Minutes of Exercise per Session: 10 min  Stress: No Stress Concern Present   Feeling of Stress : Not at all  Social Connections: Moderately Integrated   Frequency of Communication with Friends and Family: More than three times a week   Frequency of Social Gatherings with Friends and Family: Once a week   Attends Religious Services: More than 4 times per year   Active Member of [employer] or Organizations: No   Attends Music therapist: Never   Marital Status: Married  Human resources officer Violence: Not At Risk   Fear of Current or Ex-Partner: No   Emotionally Abused: No   Physically Abused: No   Sexually Abused: No   Family History  Problem Relation Age of Onset   Diabetes Mother    Heart attack Mother 41   Diabetes Father    Lung cancer Father    Diabetes Sister    Thyroid disease Sister    Diabetes Sister    HIV Brother    Colon polyps Neg Hx    Colon cancer Neg Hx    ROS: All systems reviewed and negative except as per HPI.   Current Outpatient Medications  Medication Sig Dispense Refill   albuterol (PROVENTIL HFA;VENTOLIN HFA) 108 (90 Base) MCG/ACT inhaler Inhale 2 puffs into the lungs every 6 (six) hours as needed for wheezing or shortness of breath.     alendronate (FOSAMAX) 70 MG tablet Take 1 tablet (70 mg total) by mouth once a week. Take with a full glass of water on an empty stomach. 13 tablet 3   apixaban (ELIQUIS) 5 MG TABS tablet Take 1 tablet (5 mg total) by mouth 2 (two) times daily. 180 tablet 3   ascorbic acid (VITAMIN C) 500 MG tablet Take 500 mg by mouth daily.     budesonide-formoterol (SYMBICORT) 160-4.5 MCG/ACT inhaler Inhale 2 puffs into the lungs daily as needed. 3 each 3   carvedilol (COREG) 12.5 MG tablet Take 1.5 tablets (18.75 mg total) by mouth 2 (two) times  daily with a meal. 90 tablet 3   Cholecalciferol 25 MCG (1000 UT) tablet Take 1,000 Units by mouth daily.     dapagliflozin propanediol (FARXIGA) 10 MG TABS tablet Take 1 tablet (10 mg total) by mouth daily before breakfast. 30 tablet 11   fluticasone (FLONASE) 50 MCG/ACT nasal spray Place 1 spray into both nostrils daily. 16 g 3   methimazole (TAPAZOLE) 5 MG tablet Take 3 tablets (15 mg total) by mouth daily. 270 tablet 3   pantoprazole (PROTONIX) 40 MG tablet Take 1 tablet (40 mg total) by mouth daily. 30 tablet 11   rosuvastatin (CRESTOR) 40 MG tablet Take 1 tablet (40 mg total) by mouth daily. 90 tablet 3   sacubitril-valsartan (ENTRESTO) 49-51 MG Take 1 tablet  by mouth 2 (two) times daily. 60 tablet 4   senna-docusate (SENOKOT-S) 8.6-50 MG tablet Take 2 tablets by mouth at bedtime.     spironolactone (ALDACTONE) 25 MG tablet Take 1 tablet (25 mg total) by mouth daily. 90 tablet 3   terconazole (TERAZOL 7) 0.4 % vaginal cream Place 1 applicator vaginally at bedtime. 45 g 0   torsemide (DEMADEX) 20 MG tablet Take 60mg  daily alternating with 40mg  every other day 420 tablet 2   traMADol (ULTRAM) 50 MG tablet TAKE 1 TABLET BY MOUTH THREE TIMES DAILY 90 tablet 5   trolamine salicylate (ASPERCREME) 10 % cream Apply 1 application topically as needed for muscle pain.     valACYclovir (VALTREX) 500 MG tablet Take on tablet by mouth twice a day for 3 days as needed for an outbreak 30 tablet 0   zolpidem (AMBIEN) 5 MG tablet Take 0.5-1 tablets (2.5-5 mg total) by mouth at bedtime as needed for sleep. 15 tablet 0   Semaglutide,0.25 or 0.5MG /DOS, (OZEMPIC, 0.25 OR 0.5 MG/DOSE,) 2 MG/1.5ML SOPN Inject 0.25mg  subcutaneously weekly for 4 weeks, then increase to 0.5mg  weekly for 4 weeks (Patient not taking: Reported on 04/14/2021) 1.5 mL 1   No current facility-administered medications for this encounter.   Wt Readings from Last 3 Encounters:  04/14/21 114.2 kg (251 lb 12.8 oz)  03/17/21 113.4 kg (250 lb)   03/13/21 116.7 kg (257 lb 3.2 oz)   BP 102/70    Pulse 80    Wt 114.2 kg (251 lb 12.8 oz)    SpO2 97%    BMI 39.44 kg/m  General:  NAD. No resp difficulty, arrived in [facility] HEENT: Normal Neck: Supple. No JVD. Carotids 2+ bilat; no bruits. No lymphadenopathy or thryomegaly appreciated. Cor: PMI nondisplaced. Regular rate & rhythm. No rubs, gallops or murmurs. Lungs: Clear Abdomen: Obese, nontender, nondistended. No hepatosplenomegaly. No bruits or masses. Good bowel sounds. Extremities: No cyanosis, clubbing, rash, edema Neuro: Alert & oriented x 3, cranial nerves grossly intact. Moves all 4 extremities w/o difficulty. Affect pleasant.  Assessment/Plan: 1. Chronic HF with mid range EF: Nonischemic cardiomyopathy.  Echo in 9/21 showed EF 45-50%, global hypokinesis, mild LVH, bioprosthetic aortic valve mean gradient 10 mmHg, normal RV.  Cause of cardiomyopathy is uncertain, mild CAD on 1/22 cath.  It is possible that this is chronic effect of long-standing AI that was not improved with valve replacement.  Also consider long-standing HTN.  Cardiac MRI in 10/22 showed LV EF 41%, normal RV, subendocardial LGE in the mid-apical inferior wall and the mid-apical inferolateral wall. The LGE pattern does not look like cardiac amyloidosis.  The subendocardial LGE looks like it came from prior MI but minimal CAD on cath => ?prior embolism down a coronary related to atrial fibrillation. Echo 12/22 was a very poor study, EF looks to be around 35%.  NYHA class III, she is limited by knee pain.  She is not volume overloaded by exam, weight down 6 lbs, REDS 35%.   - Continue Entresto 49/51 mg bid. BMET at PCP office yesterday 04/13/21, awaiting results - Continue carvedilol 18.75 mg bid. - Continue torsemide 60 mg daily alternating with 40 mg every other day. - Continue Farxiga 10 mg daily. No GU symptoms. - Continue spironolactone 25 mg daily.  - Increase activity.  2. Aortic valve disorders: Per report, had  severe AI.  Now s/p bioprosthetic aortic valve replacement.  The valve functions normally on echo 12/22.  - Will need antibiotic prophylaxis with  dental work.  3. HTN: BP controlled on current regimen.  4. Atrial fibrillation: Paroxysmal.  She is regular on exam today. - Continue apixaban.   5. Obesity: Body mass index is 39.44 kg/m. - She has been Rx'd semaglutide but her pharmacy does not have it in stock. I called [facility] outpatient pharmacy, they have her dose of Ozempic. Will send Rx there for her to pick up and start.  Followup in 3 months with Dr. Aundra Dubin as scheduled.   [place], [place] 04/14/2021

## 2021-04-14 ENCOUNTER — Encounter (HOSPITAL_COMMUNITY): Payer: Self-pay

## 2021-04-14 ENCOUNTER — Other Ambulatory Visit: Payer: Self-pay

## 2021-04-14 ENCOUNTER — Other Ambulatory Visit (HOSPITAL_COMMUNITY): Payer: Self-pay

## 2021-04-14 ENCOUNTER — Ambulatory Visit (HOSPITAL_COMMUNITY)
Admission: RE | Admit: 2021-04-14 | Discharge: 2021-04-14 | Disposition: A | Discharge status: Home / Self Care | Payer: Medicare HMO | Source: Ambulatory Visit | Attending: Family Medicine | Admitting: Family Medicine

## 2021-04-14 VITALS — BP 102/70 | HR 80 | Wt 251.8 lb

## 2021-04-14 DIAGNOSIS — I110 Hypertensive heart disease with heart failure: Secondary | ICD-10-CM | POA: Diagnosis not present

## 2021-04-14 DIAGNOSIS — I480 Paroxysmal atrial fibrillation: Secondary | ICD-10-CM | POA: Insufficient documentation

## 2021-04-14 DIAGNOSIS — Z953 Presence of xenogenic heart valve: Secondary | ICD-10-CM | POA: Insufficient documentation

## 2021-04-14 DIAGNOSIS — Z952 Presence of prosthetic heart valve: Secondary | ICD-10-CM

## 2021-04-14 DIAGNOSIS — Z6839 Body mass index (BMI) 39.0-39.9, adult: Secondary | ICD-10-CM | POA: Diagnosis not present

## 2021-04-14 DIAGNOSIS — I10 Essential (primary) hypertension: Secondary | ICD-10-CM | POA: Diagnosis not present

## 2021-04-14 DIAGNOSIS — Z923 Personal history of irradiation: Secondary | ICD-10-CM | POA: Insufficient documentation

## 2021-04-14 DIAGNOSIS — Z86718 Personal history of other venous thrombosis and embolism: Secondary | ICD-10-CM | POA: Diagnosis not present

## 2021-04-14 DIAGNOSIS — I252 Old myocardial infarction: Secondary | ICD-10-CM | POA: Insufficient documentation

## 2021-04-14 DIAGNOSIS — Z79899 Other long term (current) drug therapy: Secondary | ICD-10-CM | POA: Insufficient documentation

## 2021-04-14 DIAGNOSIS — Z7901 Long term (current) use of anticoagulants: Secondary | ICD-10-CM | POA: Insufficient documentation

## 2021-04-14 DIAGNOSIS — Z8673 Personal history of transient ischemic attack (TIA), and cerebral infarction without residual deficits: Secondary | ICD-10-CM | POA: Diagnosis not present

## 2021-04-14 DIAGNOSIS — R0601 Orthopnea: Secondary | ICD-10-CM | POA: Insufficient documentation

## 2021-04-14 DIAGNOSIS — M179 Osteoarthritis of knee, unspecified: Secondary | ICD-10-CM | POA: Diagnosis not present

## 2021-04-14 DIAGNOSIS — I428 Other cardiomyopathies: Secondary | ICD-10-CM | POA: Diagnosis not present

## 2021-04-14 DIAGNOSIS — Z853 Personal history of malignant neoplasm of breast: Secondary | ICD-10-CM | POA: Diagnosis not present

## 2021-04-14 DIAGNOSIS — I351 Nonrheumatic aortic (valve) insufficiency: Secondary | ICD-10-CM | POA: Insufficient documentation

## 2021-04-14 DIAGNOSIS — Z7984 Long term (current) use of oral hypoglycemic drugs: Secondary | ICD-10-CM | POA: Insufficient documentation

## 2021-04-14 DIAGNOSIS — E0590 Thyrotoxicosis, unspecified without thyrotoxic crisis or storm: Secondary | ICD-10-CM | POA: Diagnosis not present

## 2021-04-14 DIAGNOSIS — Z79890 Hormone replacement therapy (postmenopausal): Secondary | ICD-10-CM | POA: Insufficient documentation

## 2021-04-14 DIAGNOSIS — E669 Obesity, unspecified: Secondary | ICD-10-CM | POA: Diagnosis not present

## 2021-04-14 DIAGNOSIS — M25569 Pain in unspecified knee: Secondary | ICD-10-CM | POA: Insufficient documentation

## 2021-04-14 DIAGNOSIS — I5022 Chronic systolic (congestive) heart failure: Secondary | ICD-10-CM | POA: Diagnosis not present

## 2021-04-14 DIAGNOSIS — I2510 Atherosclerotic heart disease of native coronary artery without angina pectoris: Secondary | ICD-10-CM | POA: Insufficient documentation

## 2021-04-14 LAB — BASIC METABOLIC PANEL
BUN: 20 mg/dL (ref 6–23)
CO2: 27 mEq/L (ref 19–32)
Calcium: 10.3 mg/dL (ref 8.4–10.5)
Chloride: 108 mEq/L (ref 96–112)
Creatinine, Ser: 1.52 mg/dL — ABNORMAL HIGH (ref 0.40–1.20)
GFR: 33.4 mL/min — ABNORMAL LOW (ref >60.00)
Glucose, Bld: 94 mg/dL (ref 70–99)
Potassium: 4.8 mEq/L (ref 3.5–5.1)
Sodium: 140 mEq/L (ref 135–145)

## 2021-04-14 MED ORDER — OZEMPIC (0.25 OR 0.5 MG/DOSE) 2 MG/1.5ML ~~LOC~~ SOPN
PEN_INJECTOR | SUBCUTANEOUS | 1 refills | Status: DC
  Filled 2021-04-14: qty 1.5, 42d supply, fill #0
  Filled 2021-05-07: qty 1.5, 42d supply, fill #1
  Filled 2021-05-20: qty 1.5, 30d supply, fill #1

## 2021-04-14 NOTE — Patient Instructions (Signed)
Than you for your visit today.  There has been no changes to your medications.  Your physician recommends that you schedule a follow-up appointment in: with Dr. Aundra Dubin as schedule   If you have any questions or concerns before your next appointment please send [place] a message through [facility] or call our office at [phone].    TO LEAVE A MESSAGE FOR THE NURSE SELECT OPTION 2, PLEASE LEAVE A MESSAGE INCLUDING: YOUR NAME DATE OF BIRTH CALL BACK NUMBER REASON FOR CALL**this is important as we prioritize the call backs  YOU WILL RECEIVE A CALL BACK THE SAME DAY AS LONG AS YOU CALL BEFORE 4:00 PM  At the [facility], you and your health needs are our priority. As part of our continuing mission to provide you with exceptional heart care, we have created designated Provider Care Teams. These Care Teams include your primary Cardiologist (physician) and Advanced Practice Providers (APPs- Physician Assistants and Nurse Practitioners) who all work together to provide you with the care you need, when you need it.   You may see any of the following providers on your designated Care Team at your next follow up: Dr Glori Bickers Dr Haynes Kerns, NP Lyda Jester, [facility] [place], [place] Audry Riles, PharmD   Please be sure to bring in all your medications bottles to every appointment.

## 2021-04-14 NOTE — Progress Notes (Signed)
ReDS Vest / Clip - 04/14/21 0800       ReDS Vest / Clip   Station Marker D    Ruler Value 31.5    ReDS Value Range Low volume    ReDS Actual Value 35

## 2021-04-16 ENCOUNTER — Other Ambulatory Visit: Payer: Medicare HMO

## 2021-04-16 DIAGNOSIS — R197 Diarrhea, unspecified: Secondary | ICD-10-CM

## 2021-04-18 ENCOUNTER — Other Ambulatory Visit: Payer: Self-pay

## 2021-04-18 ENCOUNTER — Ambulatory Visit (HOSPITAL_COMMUNITY)
Admission: EM | Admit: 2021-04-18 | Discharge: 2021-04-18 | Disposition: A | Discharge status: Home / Self Care | Payer: Medicare HMO | Attending: Emergency Medicine | Admitting: Emergency Medicine

## 2021-04-18 ENCOUNTER — Encounter (HOSPITAL_COMMUNITY): Payer: Self-pay | Admitting: Emergency Medicine

## 2021-04-18 DIAGNOSIS — N898 Other specified noninflammatory disorders of vagina: Secondary | ICD-10-CM | POA: Diagnosis not present

## 2021-04-18 DIAGNOSIS — I130 Hypertensive heart and chronic kidney disease with heart failure and stage 1 through stage 4 chronic kidney disease, or unspecified chronic kidney disease: Secondary | ICD-10-CM | POA: Insufficient documentation

## 2021-04-18 DIAGNOSIS — N183 Chronic kidney disease, stage 3 unspecified: Secondary | ICD-10-CM | POA: Insufficient documentation

## 2021-04-18 DIAGNOSIS — Z8673 Personal history of transient ischemic attack (TIA), and cerebral infarction without residual deficits: Secondary | ICD-10-CM | POA: Insufficient documentation

## 2021-04-18 DIAGNOSIS — B3731 Acute candidiasis of vulva and vagina: Secondary | ICD-10-CM | POA: Diagnosis not present

## 2021-04-18 DIAGNOSIS — Z113 Encounter for screening for infections with a predominantly sexual mode of transmission: Secondary | ICD-10-CM | POA: Diagnosis not present

## 2021-04-18 DIAGNOSIS — I480 Paroxysmal atrial fibrillation: Secondary | ICD-10-CM | POA: Insufficient documentation

## 2021-04-18 DIAGNOSIS — Z7901 Long term (current) use of anticoagulants: Secondary | ICD-10-CM | POA: Insufficient documentation

## 2021-04-18 DIAGNOSIS — I5022 Chronic systolic (congestive) heart failure: Secondary | ICD-10-CM | POA: Diagnosis not present

## 2021-04-18 DIAGNOSIS — N760 Acute vaginitis: Secondary | ICD-10-CM | POA: Diagnosis not present

## 2021-04-18 LAB — POCT URINALYSIS DIPSTICK, ED / UC
Bilirubin Urine: NEGATIVE
Glucose, UA: 250 mg/dL — AB
Ketones, ur: NEGATIVE mg/dL
Nitrite: NEGATIVE
Protein, ur: NEGATIVE mg/dL
Specific Gravity, Urine: 1.02 (ref 1.005–1.030)
Urobilinogen, UA: 0.2 mg/dL (ref 0.0–1.0)
pH: 5.5 (ref 5.0–8.0)

## 2021-04-18 MED ORDER — FLUCONAZOLE 150 MG PO TABS: 150.0000 mg | ORAL_TABLET | ORAL | 0 refills | Status: DC

## 2021-04-18 MED ORDER — FLUCONAZOLE 150 MG PO TABS: ORAL_TABLET | ORAL | 0 refills | Status: DC

## 2021-04-18 NOTE — ED Triage Notes (Addendum)
Patient c/o vaginal discharge x 2 days ago.   Patient endorses itching.   Patient denies pain or foul odor.

## 2021-04-18 NOTE — Discharge Instructions (Addendum)
The analysis of the urine sample you provided today revealed a significant amount of sugar in your urine.  I have included some information about low carb diets to help you reduce the amount of sugar that you are eating which will help prevent future vaginal yeast infections, low carbohydrate diets have also been shown to help with weight loss.    Because there was also a small amount of white blood cells in your urine, urine culture will be performed as well.  If there is a positive result, you will be contacted by phone and antibiotics will be provided for you to treat you for a urinary tract infection.  The white blood cells present could also be due to you are having a vaginal yeast infection.  The result of your vaginal swab test will be made available to you once it is complete, this typically takes 3 to 5 days.  The result will initially be posted to your MyChart and, if there are any abnormal findings, you will receive a phone call with those results along with further instructions regarding any further treatment, if needed.    Based on the history that you provided to me today, you will be treated empirically for vaginal candidiasis action with two dose of fluconazole (Diflucan).  Please take the first tablet today and take the second tablet three days after the first tablet, by our calculations that should be this coming Tuesday.  Because you are taking Ozempic, to prevent future vaginal yeast infections I recommend that you begin taking a weekly dose of Diflucan.  Please take 1 tablet every time you inject Ozempic.  I provided you with a 55-month prescription.  Once in 6 months is complete, see how you do without it and if the yeast infections return, asked for your primary care provider to provide you with a renewal of the prescription.   If you have not had complete resolution of your current symptoms after completing treatment, please return for repeat evaluation.   Thank you for visiting  urgent care today.  I appreciate the opportunity to participate in your care.

## 2021-04-18 NOTE — ED Provider Notes (Signed)
[facility]    CSN: [id number] Arrival date & time: 04/18/21  1041    HISTORY   Chief Complaint  Patient presents with   Vaginal Discharge   HPI Danielle [name] is a 75 y.o. female. Patient complains of a 2-day history of vaginal discharge accompanied by itching.  EMR reviewed, patient has a history of CVA Eliquis 5 mg twice daily, congestive heart failure Presto and renal failure, on Farxiga.  Patient states she was recently prescribed Ozempic to help her with weight loss, first dose was last Tuesday.  Patient states she is not a diabetic.  Blood pressure is excellent on arrival today.  Patient states she has never had a vaginal yeast infection this bad before, is having a significant amount of thick white vaginal discharge with a lot of vaginal itching.  Patient denies pelvic pain, pelvic pressure, increased frequency of urination, burning with urination, genital lesion, dyspareunia, fever, aches, chills.  Patient states she is not currently sexually active.  Patient also complains of bleeding in her right ear canal, patient denies use of Q-tips or Bobby pins to clean out her ears, states she never puts anything in her ears at all but when she noticed blood coming out of her right ear she did use a Q-tip to try to clean the blood up.  The history is provided by the patient.  Past Medical History:  Diagnosis Date   Anticoagulated on Coumadin    managed by cardiology   Anxiety    Chronic constipation    Chronic systolic CHF (congestive heart failure) ([place]) 2016   cardiologist--- dr end and followed by CHF clinic   CKD (chronic kidney disease), stage III ([place])    COPD (chronic obstructive pulmonary disease) ([place])    previous had seen pulmonology --- dr Melvyn Novas, note in epic 03-14-2016, dx COPD GOLD 0   Coronary artery disease    cardiologist--- dr Durward Mallard;  last cardiac cath 01-10-20202 mild nonobstructive disease proxLAD;  per pt, had LHC prior to AVR in [place] 2016    DOE (dyspnea on exertion)    GERD (gastroesophageal reflux disease)    History of 2019 novel coronavirus disease (COVID-19) 12/19/2019   hospital admission w/ covid pneumonia , no intubatiion,  per pt symptoms resolved and back to baseline   History of cerebrovascular accident (CVA) with residual deficit    CVA in 2000 without residuals;  CVA post op AVR 01/ 2016 with residual right hand weakness   History of gastritis    History of pneumothorax    s/p right vats 11-19-2014 and  s/p left vats  12-13-2015   (both spontenaous due to bleb)   Hyperlipidemia    Hypertension    Hyperthyroidism    per pt followed by pcp,  dx approx 2014   Malignant neoplasm of upper-outer quadrant of left breast in female, estrogen receptor positive ([place]) 11/2016   oncologist-- dr Burr Medico--- dx 10/ 2018  ,  s/p left lumptectomy with node dissection;  completed radiation 04-15-2017, no chemo   Multiple thyroid nodules    in care everywhere pt had left thyroid nodule biospy 09-18-2014 benign   NICM (nonischemic cardiomyopathy) ([place]) 2016   2016  ef 30%;  last echo in epic ef 45%   OA (osteoarthritis)    Osteoporosis    Paroxysmal atrial fibrillation [facility])    cardiologist--- dr end   Personal history of radiation therapy    left breast cancer  03-08-2017  to 04-05-2017   Thrombocytopenia ([place])  followed by dr Burr Medico   Valvular stenosis, aortic 03/12/2014   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   Wears dentures    full upper   Patient Active Problem List   Diagnosis Date Noted   Depression, major, single episode, moderate ([place]) 12/08/2020   Gait abnormality 07/31/2020   Paresthesia 07/31/2020   Low back pain with bilateral sciatica 07/31/2020   Pneumonia due to COVID-19 virus    Bacteremia due to Gram-positive bacteria 08/29/2018   Abscess    Cellulitis of left breast 08/28/2018   Cellulitis of left abdominal wall 08/28/2018   Encounter for routine gynecological examination  04/28/2018   Stage 3 chronic kidney disease ([place]) 09/27/2017   Recurrent genital herpes 04/13/2017   Chronic pain disorder 01/25/2017   Malignant neoplasm of upper-outer quadrant of left breast in female, estrogen receptor positive ([place]) 12/16/2016   Chronic knee pain 12/07/2016   Chronic bilateral low back pain without sciatica 12/07/2016   Peripheral musculoskeletal gait disorder 12/07/2016   Encounter for therapeutic drug monitoring 06/16/2016   Generalized osteoarthritis of multiple sites 05/04/2016   Aortic valve disease 04/10/2016   S/P AVR 04/10/2016   NICM (nonischemic cardiomyopathy) ([place]) 04/10/2016   Upper airway cough syndrome 03/14/2016   Morbid obesity ([place]) 03/14/2016   COPD (chronic obstructive pulmonary disease) ([place]) 03/11/2016   Thrombocytopenia ([place]) 12/12/2015   Anemia 12/12/2015   Vitamin D deficiency 12/12/2015   Acute urinary retention 12/11/2015   Osteoporosis 12/11/2015   Hip fracture ([place]) 12/10/2015   Aortic atherosclerosis ([place]) 12/10/2015   Lung blebs ([place]) 12/03/2015   Pelvic fracture ([place]) 08/19/2015   Fall at home 08/19/2015   Essential hypertension 08/19/2015   Mixed hyperlipidemia 08/19/2015   Asthma 08/19/2015   Paroxysmal atrial fibrillation ([place]) 08/19/2015   Hyperthyroidism 08/19/2015   Chronic HFrEF (heart failure with reduced ejection fraction) ([place])    Coronary artery disease    Aortic stenosis    Past Surgical History:  Procedure Laterality Date   AORTIC VALVE REPLACEMENT  03/12/2014   [facility] in [place];  St Jude 4mm, medial Trifecta Bioprosthesis   BREAST LUMPECTOMY WITH RADIOACTIVE SEED AND SENTINEL LYMPH NODE BIOPSY Left 01/14/2017   Procedure: LEFT BREAST LUMPECTOMY WITH RADIOACTIVE SEED AND SENTINEL LYMPH NODE BIOPSY;  Surgeon: Rolm Bookbinder, MD;  Location: [facility];  Service: General;  Laterality: Left;   CARDIAC CATHETERIZATION  02/12/2014   [facility] in [place] D & C N/A 11/17/2020    Procedure: DILATATION AND CURETTAGE /HYSTEROSCOPY WITH MYOSURE;  Surgeon: Nunzio Cobbs, MD;  Location: [facility];  Service: Gynecology;  Laterality: N/A;   INTRAMEDULLARY (IM) NAIL INTERTROCHANTERIC Left 12/10/2015   Procedure: INTRAMEDULLARY (IM) NAIL INTERTROCHANTRIC;  Surgeon: Leandrew Koyanagi, MD;  Location: [facility];  Service: Orthopedics;  Laterality: Left;   OPERATIVE ULTRASOUND N/A 11/17/2020   Procedure: OPERATIVE ULTRASOUND;  Surgeon: Nunzio Cobbs, MD;  Location: [facility];  Service: Gynecology;  Laterality: N/A;   PLEURADESIS Left 12/03/2015   Procedure: PLEURADESIS;  Surgeon: Melrose Nakayama, MD;  Location: [facility];  Service: Thoracic;  Laterality: Left;   RESECTION OF APICAL BLEB Left 12/03/2015   Procedure: BLEBECTOMY;  Surgeon: Melrose Nakayama, MD;  Location: [facility];  Service: Thoracic;  Laterality: Left;   RIGHT/LEFT HEART CATH AND CORONARY ANGIOGRAPHY N/A 03/10/2020   Procedure: RIGHT/LEFT HEART CATH AND CORONARY ANGIOGRAPHY;  Surgeon: [name], MD;  Location:  [facility];  Service: Cardiovascular;  Laterality: N/A;   VIDEO ASSISTED THORACOSCOPY Left 12/03/2015   Procedure: VIDEO ASSISTED THORACOSCOPY;  Surgeon: Melrose Nakayama, MD;  Location: [facility];  Service: Thoracic;  Laterality: Left;   VIDEO ASSISTED THORACOSCOPY (VATS) W/TALC PLEUADESIS Right 11/19/2014   in [place]   OB History     Gravida  0   Para  0   Term  0   Preterm  0   AB  0   Living  0      SAB  0   IAB  0   Ectopic  0   Multiple  0   Live Births  0          Home Medications    Prior to Admission medications   Medication Sig Start Date End Date Taking? Authorizing Provider  alendronate (FOSAMAX) 70 MG tablet Take 1 tablet (70 mg total) by mouth once a week. Take with a full glass of water on an empty stomach. 07/24/19  Yes [name], [name], MD  apixaban (ELIQUIS) 5 MG TABS tablet Take 1 tablet (5 mg  total) by mouth 2 (two) times daily. 03/26/21  Yes Larey Dresser, MD  ascorbic acid (VITAMIN C) 500 MG tablet Take 500 mg by mouth daily.   Yes [provider]  budesonide-formoterol (SYMBICORT) 160-4.5 MCG/ACT inhaler Inhale 2 puffs into the lungs daily as needed. 11/25/20  Yes [name], [name], MD  carvedilol (COREG) 12.5 MG tablet Take 1.5 tablets (18.75 mg total) by mouth 2 (two) times daily with a meal. 03/26/21  Yes Larey Dresser, MD  Cholecalciferol 25 MCG (1000 UT) tablet Take 1,000 Units by mouth daily.   Yes [provider]  dapagliflozin propanediol (FARXIGA) 10 MG TABS tablet Take 1 tablet (10 mg total) by mouth daily before breakfast. 01/16/21  Yes Larey Dresser, MD  methimazole (TAPAZOLE) 5 MG tablet Take 3 tablets (15 mg total) by mouth daily. 03/31/21  Yes [name], [name], MD  rosuvastatin (CRESTOR) 40 MG tablet Take 1 tablet (40 mg total) by mouth daily. 03/26/21  Yes Larey Dresser, MD  sacubitril-valsartan (ENTRESTO) 49-51 MG Take 1 tablet by mouth 2 (two) times daily. 03/13/21  Yes [place], Maricela Bo, FNP  Semaglutide,0.25 or 0.5MG /DOS, (OZEMPIC, 0.25 OR 0.5 MG/DOSE,) 2 MG/1.5ML SOPN Inject 0.25mg  subcutaneously weekly for 4 weeks, then increase to 0.5mg  weekly for 4 weeks 04/14/21  Yes [place]ford, Jessica M, FNP  senna-docusate (SENOKOT-S) 8.6-50 MG tablet Take 2 tablets by mouth at bedtime.   Yes [provider]  spironolactone (ALDACTONE) 25 MG tablet Take 1 tablet (25 mg total) by mouth daily. 03/26/21  Yes Larey Dresser, MD  terconazole (TERAZOL 7) 0.4 % vaginal cream Place 1 applicator vaginally at bedtime. 12/24/20  Yes [name], [name], MD  torsemide [facility]) 20 MG tablet Take 60mg  daily alternating with 40mg  every other day 03/26/21  Yes Larey Dresser, MD  traMADol (ULTRAM) 50 MG tablet TAKE 1 TABLET BY MOUTH THREE TIMES DAILY 12/17/20  Yes Kirsteins, Luanna Salk, MD  valACYclovir (VALTREX) 500 MG tablet Take on tablet by mouth twice a day for 3  days as needed for an outbreak 11/10/20  Yes Nunzio Cobbs, MD  zolpidem (AMBIEN) 5 MG tablet Take 0.5-1 tablets (2.5-5 mg total) by mouth at bedtime as needed for sleep. 04/13/21  Yes [name], [name], MD  albuterol (PROVENTIL HFA;VENTOLIN HFA) 108 (90 Base) MCG/ACT inhaler Inhale 2  puffs into the lungs every 6 (six) hours as needed for wheezing or shortness of breath. 12/11/14   [provider]  fluticasone (FLONASE) 50 MCG/ACT nasal spray Place 1 spray into both nostrils daily. 01/30/20   [name], [name], MD  pantoprazole (PROTONIX) 40 MG tablet Take 1 tablet (40 mg total) by mouth daily. 10/06/20   Larey Dresser, MD  trolamine salicylate (ASPERCREME) 10 % cream Apply 1 application topically as needed for muscle pain.    [provider]   Family History Family History  Problem Relation Age of Onset   Diabetes Mother    Heart attack Mother 47   Diabetes Father    Lung cancer Father    Diabetes Sister    Thyroid disease Sister    Diabetes Sister    HIV Brother    Colon polyps Neg Hx    Colon cancer Neg Hx    Social History Social History   Tobacco Use   Smoking status: Former    Packs/day: 0.25    Years: 10.00    Pack years: 2.50    Types: Cigarettes    Quit date: 2012    Years since quitting: 11.1   Smokeless tobacco: Never  Vaping Use   Vaping Use: Never used  Substance Use Topics   Alcohol use: No   Drug use: Never   Allergies   Lisinopril, Tetanus toxoid adsorbed, and Other  Review of Systems Review of Systems Pertinent findings noted in history of present illness.   Physical Exam Triage Vital Signs ED Triage Vitals  Enc Vitals Group     BP 12/26/20 0827 (!) 147/82     Pulse Rate 12/26/20 0827 72     Resp 12/26/20 0827 18     Temp 12/26/20 0827 98.3 F (36.8 C)     Temp Source 12/26/20 0827 Oral     SpO2 12/26/20 0827 98 %     Weight --      Height --      Head Circumference --      Peak Flow --      Pain Score 12/26/20 0826  5     Pain Loc --      Pain Edu? --      Excl. in [place]? --   No data found.  Updated Vital Signs BP 127/89 (BP Location: Right Arm)    Pulse 89    Temp 98.4 F (36.9 C) (Oral)    Resp 18    SpO2 97%   Physical Exam Vitals and nursing note reviewed.  Constitutional:      General: She is not in acute distress.    Appearance: Normal appearance. She is not ill-appearing.  HENT:     Head: Normocephalic and atraumatic.     Ears:     Comments: Hematoma appreciated at the base of right ear canal, dried blood also present in canal.  Left EAC normal.  Bilateral TMs normal. Eyes:     General: Lids are normal.        Right eye: No discharge.        Left eye: No discharge.     Extraocular Movements: Extraocular movements intact.     Conjunctiva/sclera: Conjunctivae normal.     Right eye: Right conjunctiva is not injected.     Left eye: Left conjunctiva is not injected.  Neck:     Trachea: Trachea and phonation normal.  Cardiovascular:     Rate and Rhythm: Normal rate and regular  rhythm.     Pulses: Normal pulses.     Heart sounds: Normal heart sounds. No murmur heard.   No friction rub. No gallop.  Pulmonary:     Effort: Pulmonary effort is normal. No accessory muscle usage, prolonged expiration or respiratory distress.     Breath sounds: Normal breath sounds. No stridor, decreased air movement or transmitted upper airway sounds. No decreased breath sounds, wheezing, rhonchi or rales.  Chest:     Chest wall: No tenderness.  Genitourinary:    Comments: Patient politely declines pelvic exam today, patient provided a vaginal swab for testing. Musculoskeletal:        General: Normal range of motion.     Cervical back: Normal range of motion and neck supple. Normal range of motion.  Lymphadenopathy:     Cervical: No cervical adenopathy.  Skin:    General: Skin is warm and dry.     Findings: No erythema or rash.  Neurological:     General: No focal deficit present.     Mental Status:  She is alert and oriented to person, place, and time.  Psychiatric:        Mood and Affect: Mood normal.        Behavior: Behavior normal.    Visual Acuity Right Eye Distance:   Left Eye Distance:   Bilateral Distance:    Right Eye Near:   Left Eye Near:    Bilateral Near:     UC Couse / Diagnostics / Procedures:    EKG  Radiology No results found.  Procedures Procedures (including critical care time)  UC Diagnoses / Final Clinical Impressions(s)   I have reviewed the triage vital signs and the nursing notes.  Pertinent labs & imaging results that were available during my care of the patient were reviewed by me and considered in my medical decision making (see chart for details).    Final diagnoses:  Vaginal discharge  Vaginitis and vulvovaginitis  Vaginal yeast infection   Patient was provided with Diflucan 150 mg every 3 days x 2 for empiric treatment of presumed vulvovaginal candidiasis based on the history provided to me today. STD screening was performed, patient advised that the results be posted to their MyChart and if any of the results are positive, they will be notified by phone, further treatment will be provided as indicated based on results of STD screening. Return precautions advised.  Drug allergies reviewed, all questions addressed.   Urine dip today was positive for glucosuria, hematuria to a large degree, and pyuria.  Urine culture will be performed per our protocol.  Patient denies irritative urinary outflow tract symptoms at this time. Patient advised that they will be contacted with results and that adjustments to treatment will be provided as indicated based on the results.   Patient was advised of possibility that urine culture results may be negative if sample provided was obtained late in the day causing urine to be more diluted.  Patient was advised that if antibiotics were effective after the first 24 to 36 hours, despite negative urine culture  result, it is recommended that they complete the full course as prescribed.    ED Prescriptions     Medication Sig Dispense Auth. Provider   fluconazole (DIFLUCAN) 150 MG tablet Take 1 tablet today.  Take second tablet 3 days later. 2 tablet Lynden Oxford Scales, PA-C   fluconazole (DIFLUCAN) 150 MG tablet Take 1 tablet (150 mg total) by mouth once a week for 24 doses.  24 tablet Lynden Oxford Scales, PA-C      PDMP not reviewed this encounter.  Pending results:  Labs Reviewed  POCT URINALYSIS DIPSTICK, ED / UC - Abnormal; Notable for the following components:      Result Value   Glucose, UA 250 (*)    Hgb urine dipstick MODERATE (*)    Leukocytes,Ua SMALL (*)    All other components within normal limits  POCT URINALYSIS DIP (MANUAL ENTRY)  CERVICOVAGINAL ANCILLARY ONLY    Medications Ordered in UC: Medications - No data to display  Disposition Upon Discharge:  Condition: stable for discharge home  Patient presented with concern for an acute illness with associated systemic symptoms and significant discomfort requiring urgent management. In my opinion, this is a condition that a prudent lay person (someone who possesses an average knowledge of health and medicine) may potentially expect to result in complications if not addressed urgently such as respiratory distress, impairment of bodily function or dysfunction of bodily organs.   As such, the patient has been evaluated and assessed, work-up was performed and treatment was provided in alignment with urgent care protocols and evidence based medicine.  Patient/parent/caregiver has been advised that the patient may require follow up for further testing and/or treatment if the symptoms continue in spite of treatment, as clinically indicated and appropriate.  Routine symptom specific, illness specific and/or disease specific instructions were discussed with the patient and/or caregiver at length.  Prevention strategies for avoiding  STD exposure were also discussed.  The patient will follow up with their current PCP if and as advised. If the patient does not currently have a PCP we will assist them in obtaining one.   The patient may need specialty follow up if the symptoms continue, in spite of conservative treatment and management, for further workup, evaluation, consultation and treatment as clinically indicated and appropriate.  Patient/parent/caregiver verbalized understanding and agreement of plan as discussed.  All questions were addressed during visit.  Please see discharge instructions below for further details of plan.  Discharge Instructions:   Discharge Instructions      The analysis of the urine sample you provided today revealed a significant amount of sugar in your urine.  I have included some information about low carb diets to help you reduce the amount of sugar that you are eating which will help prevent future vaginal yeast infections, low carbohydrate diets have also been shown to help with weight loss.    Because there was also a small amount of white blood cells in your urine, urine culture will be performed as well.  If there is a positive result, you will be contacted by phone and antibiotics will be provided for you to treat you for a urinary tract infection.  The white blood cells present could also be due to you are having a vaginal yeast infection.  The result of your vaginal swab test will be made available to you once it is complete, this typically takes 3 to 5 days.  The result will initially be posted to your MyChart and, if there are any abnormal findings, you will receive a phone call with those results along with further instructions regarding any further treatment, if needed.    Based on the history that you provided to me today, you will be treated empirically for vaginal candidiasis action with two dose of fluconazole (Diflucan).  Please take the first tablet today and take the second  tablet three days after the first tablet, by our  calculations that should be this coming Tuesday.  Because you are taking Ozempic, to prevent future vaginal yeast infections I recommend that you begin taking a weekly dose of Diflucan.  Please take 1 tablet every time you inject Ozempic.  I provided you with a 1-month prescription.  Once in 6 months is complete, see how you do without it and if the yeast infections return, asked for your primary care provider to provide you with a renewal of the prescription.   If you have not had complete resolution of your current symptoms after completing treatment, please return for repeat evaluation.   Thank you for visiting urgent care today.  I appreciate the opportunity to participate in your care.     This office note has been dictated using Museum/gallery curator.  Unfortunately, and despite my best efforts, this method of dictation can sometimes lead to occasional typographical or grammatical errors.  I apologize in advance if this occurs.      Lynden Oxford Scales, PA-C 04/18/21 1446

## 2021-04-20 LAB — STOOL CULTURE: E coli, Shiga toxin Assay: NEGATIVE

## 2021-04-20 LAB — OVA AND PARASITE EXAMINATION
CONCENTRATE RESULT:: NONE SEEN
MICRO NUMBER:: 13018272
SPECIMEN QUALITY:: ADEQUATE
TRICHROME RESULT:: NONE SEEN

## 2021-04-20 LAB — CERVICOVAGINAL ANCILLARY ONLY
Bacterial Vaginitis (gardnerella): NEGATIVE
Candida Glabrata: NEGATIVE
Candida Vaginitis: POSITIVE — AB
Chlamydia: NEGATIVE
Comment: NEGATIVE
Comment: NEGATIVE
Comment: NEGATIVE
Comment: NEGATIVE
Comment: NEGATIVE
Comment: NORMAL
Neisseria Gonorrhea: NEGATIVE
Trichomonas: NEGATIVE

## 2021-04-23 ENCOUNTER — Telehealth (HOSPITAL_COMMUNITY): Payer: Self-pay

## 2021-04-23 NOTE — Telephone Encounter (Signed)
Patient called to report that she was seen in urgent care on 04/18/21 for a yeast infection which patient thought it was due to starting Ozempic last week, and while she was there her ear started bleeding, the provider that was there stated that she thought her Eliquis needed to be adjusted. Ear has not bled since being in Urgent Care on 04/18/21.Please advise.

## 2021-04-23 NOTE — Telephone Encounter (Signed)
Ozempic unlikely to have caused a yeast infection would not adjust Eliquis (there is no adjusted dose).  If bleeding has stopped would not change anything.

## 2021-04-23 NOTE — Telephone Encounter (Signed)
Tried calling patient,unable to leave message. Will try again later.

## 2021-04-24 NOTE — Telephone Encounter (Signed)
Pt left vm returning call. I called pt back 3x's and received recording that the call could not go through. I also dialed another outside number and the call was able to go through I tried pts number again and received the same recording.

## 2021-04-28 ENCOUNTER — Telehealth: Payer: Self-pay

## 2021-04-28 ENCOUNTER — Ambulatory Visit: Payer: Medicare HMO | Admitting: Obstetrics and Gynecology

## 2021-04-28 ENCOUNTER — Other Ambulatory Visit: Payer: Self-pay

## 2021-04-28 ENCOUNTER — Encounter: Payer: Self-pay | Admitting: Obstetrics and Gynecology

## 2021-04-28 VITALS — BP 110/70 | HR 78 | Resp 18

## 2021-04-28 DIAGNOSIS — N950 Postmenopausal bleeding: Secondary | ICD-10-CM | POA: Diagnosis not present

## 2021-04-28 DIAGNOSIS — R319 Hematuria, unspecified: Secondary | ICD-10-CM | POA: Diagnosis not present

## 2021-04-28 DIAGNOSIS — N898 Other specified noninflammatory disorders of vagina: Secondary | ICD-10-CM | POA: Diagnosis not present

## 2021-04-28 LAB — URINALYSIS, COMPLETE W/RFL CULTURE
Bacteria, UA: NONE SEEN /HPF
Bilirubin Urine: NEGATIVE
Casts: NONE SEEN /LPF
Crystals: NONE SEEN /HPF
Hgb urine dipstick: NEGATIVE
Hyaline Cast: NONE SEEN /LPF
Ketones, ur: NEGATIVE
Leukocyte Esterase: NEGATIVE
Nitrites, Initial: NEGATIVE
Protein, ur: NEGATIVE
RBC / HPF: NONE SEEN /HPF (ref 0–2)
Specific Gravity, Urine: 1.01 (ref 1.001–1.035)
WBC, UA: NONE SEEN /HPF (ref 0–5)
Yeast: NONE SEEN /HPF
pH: 5.5 (ref 5.0–8.0)

## 2021-04-28 LAB — WET PREP FOR TRICH, YEAST, CLUE

## 2021-04-28 LAB — NO CULTURE INDICATED

## 2021-04-28 NOTE — Telephone Encounter (Signed)
[facility] Triage Call from pat who advised [facility] at [place] did not have her RX for diflucan. Pat request a call back once it has been called in.

## 2021-04-28 NOTE — Progress Notes (Signed)
GYNECOLOGY  VISIT   HPI: 76 y.o.   Married  [place] American  female   G0P0000 with No LMP recorded. Patient is postmenopausal.   here for  vaginal itching x 10 days and vaginal spotting x 3 days  Denies vaginal odor and dysuria.  She did notice some bleeding with wiping before going to urgent care on 04/18/21 for vaginal discharge. Patient had been using Terazol vaginal cream, left over from a prior prescription, to self treat. She declined a pelvic exam at urgent care and did a self swab of the vagina. She was treated with Diflucan 150 mg every 3 days for 2 doses for empiric treatment.  She ultimately tested positive for yeast and negative for BV, trich, GC/CT. Received prescription for Diflucan 150 mg weekly for 24 doses noted in Epic, which was prescribed after her visit.  Patient is unaware of this prescription.   At her urgent are visit, she had a urine that dipped positive for glucosuria, moderate hematuria, and small leukocytes. UC was not processed.  She takes Eliquis 5 mg bid.   Hx breast cancer.   She is taking Ozempic for weight loss and correlates her current symptoms with this medication. Due for another dose soon.  She states she has no history of diabetes.  GYNECOLOGIC HISTORY: No LMP recorded. Patient is postmenopausal. Contraception:  PMP Menopausal hormone therapy:  none Last mammogram:  01-16-21 Hx Lt.Br.Ca/Diag.Bil/Neg/BiRads2 Last pap smear:   08-20-20 Neg:Neg HR HPV. Years ago normal always               OB History     Gravida  0   Para  0   Term  0   Preterm  0   AB  0   Living  0      SAB  0   IAB  0   Ectopic  0   Multiple  0   Live Births  0              Patient Active Problem List   Diagnosis Date Noted   Depression, major, single episode, moderate ([place]) 12/08/2020   Gait abnormality 07/31/2020   Paresthesia 07/31/2020   Low back pain with bilateral sciatica 07/31/2020   Pneumonia due to COVID-19 virus    Bacteremia  due to Gram-positive bacteria 08/29/2018   Abscess    Cellulitis of left breast 08/28/2018   Cellulitis of left abdominal wall 08/28/2018   Encounter for routine gynecological examination 04/28/2018   Stage 3 chronic kidney disease ([place]) 09/27/2017   Recurrent genital herpes 04/13/2017   Chronic pain disorder 01/25/2017   Malignant neoplasm of upper-outer quadrant of left breast in female, estrogen receptor positive ([place]) 12/16/2016   Chronic knee pain 12/07/2016   Chronic bilateral low back pain without sciatica 12/07/2016   Peripheral musculoskeletal gait disorder 12/07/2016   Encounter for therapeutic drug monitoring 06/16/2016   Generalized osteoarthritis of multiple sites 05/04/2016   Aortic valve disease 04/10/2016   S/P AVR 04/10/2016   NICM (nonischemic cardiomyopathy) ([place]) 04/10/2016   Upper airway cough syndrome 03/14/2016   Morbid obesity ([place]) 03/14/2016   COPD (chronic obstructive pulmonary disease) ([place]) 03/11/2016   Thrombocytopenia ([place]) 12/12/2015   Anemia 12/12/2015   Vitamin D deficiency 12/12/2015   Acute urinary retention 12/11/2015   Osteoporosis 12/11/2015   Hip fracture ([place]) 12/10/2015   Aortic atherosclerosis ([place]) 12/10/2015   Lung blebs ([place]) 12/03/2015   Pelvic fracture ([place]) 08/19/2015   Fall at home 08/19/2015  Essential hypertension 08/19/2015   Mixed hyperlipidemia 08/19/2015   Asthma 08/19/2015   Paroxysmal atrial fibrillation ([place]) 08/19/2015   Hyperthyroidism 08/19/2015   Chronic HFrEF (heart failure with reduced ejection fraction) ([place])    Coronary artery disease    Aortic stenosis     Past Medical History:  Diagnosis Date   Anticoagulated on Coumadin    managed by cardiology   Anxiety    Chronic constipation    Chronic systolic CHF (congestive heart failure) ([place]) 2016   cardiologist--- dr end and followed by CHF clinic   CKD (chronic kidney disease), stage III ([place])    COPD (chronic obstructive pulmonary disease) ([place])     previous had seen pulmonology --- dr Melvyn Novas, note in epic 03-14-2016, dx COPD GOLD 0   Coronary artery disease    cardiologist--- dr Durward Mallard;  last cardiac cath 01-10-20202 mild nonobstructive disease proxLAD;  per pt, had LHC prior to AVR in [place] 2016   DOE (dyspnea on exertion)    GERD (gastroesophageal reflux disease)    History of 2019 novel coronavirus disease (COVID-19) 12/19/2019   hospital admission w/ covid pneumonia , no intubatiion,  per pt symptoms resolved and back to baseline   History of cerebrovascular accident (CVA) with residual deficit    CVA in 2000 without residuals;  CVA post op AVR 01/ 2016 with residual right hand weakness   History of gastritis    History of pneumothorax    s/p right vats 11-19-2014 and  s/p left vats  12-13-2015   (both spontenaous due to bleb)   Hyperlipidemia    Hypertension    Hyperthyroidism    per pt followed by pcp,  dx approx 2014   Malignant neoplasm of upper-outer quadrant of left breast in female, estrogen receptor positive ([place]) 11/2016   oncologist-- dr Burr Medico--- dx 10/ 2018  ,  s/p left lumptectomy with node dissection;  completed radiation 04-15-2017, no chemo   Multiple thyroid nodules    in care everywhere pt had left thyroid nodule biospy 09-18-2014 benign   NICM (nonischemic cardiomyopathy) ([place]) 2016   2016  ef 30%;  last echo in epic ef 45%   OA (osteoarthritis)    Osteoporosis    Paroxysmal atrial fibrillation [facility])    cardiologist--- dr end   Personal history of radiation therapy    left breast cancer  03-08-2017  to 04-05-2017   Thrombocytopenia [facility])    followed by dr Burr Medico   Valvular stenosis, aortic 03/12/2014   Status post bioprosthetic AVR  in [place]  for severe AS   Wears dentures    full upper    Past Surgical History:  Procedure Laterality Date   AORTIC VALVE REPLACEMENT  03/12/2014   [facility] in [place];  St Jude 61mm, medial Trifecta Bioprosthesis   BREAST LUMPECTOMY WITH RADIOACTIVE SEED AND  SENTINEL LYMPH NODE BIOPSY Left 01/14/2017   Procedure: LEFT BREAST LUMPECTOMY WITH RADIOACTIVE SEED AND SENTINEL LYMPH NODE BIOPSY;  Surgeon: Rolm Bookbinder, MD;  Location: [place];  Service: General;  Laterality: Left;   CARDIAC CATHETERIZATION  02/12/2014   [facility] in [place] D & C N/A 11/17/2020   Procedure: DILATATION AND CURETTAGE /HYSTEROSCOPY WITH MYOSURE;  Surgeon: Nunzio Cobbs, MD;  Location: [place];  Service: Gynecology;  Laterality: N/A;   INTRAMEDULLARY (IM) NAIL INTERTROCHANTERIC Left 12/10/2015   Procedure: INTRAMEDULLARY (IM) NAIL INTERTROCHANTRIC;  Surgeon: Leandrew Koyanagi, MD;  Location: [place];  Service: Orthopedics;  Laterality: Left;   OPERATIVE ULTRASOUND N/A 11/17/2020   Procedure: OPERATIVE ULTRASOUND;  Surgeon: Nunzio Cobbs, MD;  Location: [facility];  Service: Gynecology;  Laterality: N/A;   PLEURADESIS Left 12/03/2015   Procedure: PLEURADESIS;  Surgeon: Melrose Nakayama, MD;  Location: [place];  Service: Thoracic;  Laterality: Left;   RESECTION OF APICAL BLEB Left 12/03/2015   Procedure: BLEBECTOMY;  Surgeon: Melrose Nakayama, MD;  Location: [place];  Service: Thoracic;  Laterality: Left;   RIGHT/LEFT HEART CATH AND CORONARY ANGIOGRAPHY N/A 03/10/2020   Procedure: RIGHT/LEFT HEART CATH AND CORONARY ANGIOGRAPHY;  Surgeon: [name], MD;  Location: [place] [facility];  Service: Cardiovascular;  Laterality: N/A;   VIDEO ASSISTED THORACOSCOPY Left 12/03/2015   Procedure: VIDEO ASSISTED THORACOSCOPY;  Surgeon: Melrose Nakayama, MD;  Location: [place];  Service: Thoracic;  Laterality: Left;   VIDEO ASSISTED THORACOSCOPY (VATS) W/TALC PLEUADESIS Right 11/19/2014   in [place]    Current Outpatient Medications  Medication Sig Dispense Refill   albuterol (PROVENTIL HFA;VENTOLIN HFA) 108 (90 Base) MCG/ACT inhaler Inhale 2 puffs into the lungs every 6 (six) hours as needed for  wheezing or shortness of breath.     alendronate (FOSAMAX) 70 MG tablet Take 1 tablet (70 mg total) by mouth once a week. Take with a full glass of water on an empty stomach. 13 tablet 3   apixaban (ELIQUIS) 5 MG TABS tablet Take 1 tablet (5 mg total) by mouth 2 (two) times daily. 180 tablet 3   ascorbic acid (VITAMIN C) 500 MG tablet Take 500 mg by mouth daily.     budesonide-formoterol (SYMBICORT) 160-4.5 MCG/ACT inhaler Inhale 2 puffs into the lungs daily as needed. 3 each 3   carvedilol (COREG) 12.5 MG tablet Take 1.5 tablets (18.75 mg total) by mouth 2 (two) times daily with a meal. 90 tablet 3   Cholecalciferol 25 MCG (1000 UT) tablet Take 1,000 Units by mouth daily.     dapagliflozin propanediol (FARXIGA) 10 MG TABS tablet Take 1 tablet (10 mg total) by mouth daily before breakfast. 30 tablet 11   fluconazole (DIFLUCAN) 150 MG tablet Take 1 tablet today.  Take second tablet 3 days later. 2 tablet 0   fluconazole (DIFLUCAN) 150 MG tablet Take 1 tablet (150 mg total) by mouth once a week for 24 doses. 24 tablet 0   fluticasone (FLONASE) 50 MCG/ACT nasal spray Place 1 spray into both nostrils daily. 16 g 3   methimazole (TAPAZOLE) 5 MG tablet Take 3 tablets (15 mg total) by mouth daily. 270 tablet 3   pantoprazole (PROTONIX) 40 MG tablet Take 1 tablet (40 mg total) by mouth daily. 30 tablet 11   rosuvastatin (CRESTOR) 40 MG tablet Take 1 tablet (40 mg total) by mouth daily. 90 tablet 3   sacubitril-valsartan (ENTRESTO) 49-51 MG Take 1 tablet by mouth 2 (two) times daily. 60 tablet 4   Semaglutide,0.25 or 0.5MG /DOS, (OZEMPIC, 0.25 OR 0.5 MG/DOSE,) 2 MG/1.5ML SOPN Inject 0.25mg  subcutaneously weekly for 4 weeks, then increase to 0.5mg  weekly for 4 weeks 1.5 mL 1   senna-docusate (SENOKOT-S) 8.6-50 MG tablet Take 2 tablets by mouth at bedtime.     spironolactone (ALDACTONE) 25 MG tablet Take 1 tablet (25 mg total) by mouth daily. 90 tablet 3   terconazole (TERAZOL 7) 0.4 % vaginal cream Place 1  applicator vaginally at bedtime. 45 g 0   torsemide (DEMADEX) 20 MG tablet Take 60mg  daily  alternating with 40mg  every other day 420 tablet 2   traMADol (ULTRAM) 50 MG tablet TAKE 1 TABLET BY MOUTH THREE TIMES DAILY 90 tablet 5   trolamine salicylate (ASPERCREME) 10 % cream Apply 1 application topically as needed for muscle pain.     valACYclovir (VALTREX) 500 MG tablet Take on tablet by mouth twice a day for 3 days as needed for an outbreak 30 tablet 0   zolpidem (AMBIEN) 5 MG tablet Take 0.5-1 tablets (2.5-5 mg total) by mouth at bedtime as needed for sleep. 15 tablet 0   No current facility-administered medications for this visit.     ALLERGIES: Lisinopril, Tetanus toxoid adsorbed, and Other  Family History  Problem Relation Age of Onset   Diabetes Mother    Heart attack Mother 82   Diabetes Father    Lung cancer Father    Diabetes Sister    Thyroid disease Sister    Diabetes Sister    HIV Brother    Colon polyps Neg Hx    Colon cancer Neg Hx     Social History   Socioeconomic History   Marital status: Married    Spouse name: Sherwood   Number of children: 0   Years of education: 12   Highest education level: Bachelor's degree (e.g., BA, AB, BS)  Occupational History   Occupation: Retired in 2004  Tobacco Use   Smoking status: Former    Packs/day: 0.25    Years: 10.00    Pack years: 2.50    Types: Cigarettes    Quit date: 2012    Years since quitting: 11.1   Smokeless tobacco: Never  Vaping Use   Vaping Use: Never used  Substance and Sexual Activity   Alcohol use: No   Drug use: Never   Sexual activity: Yes    Birth control/protection: Post-menopausal  Other Topics Concern   Not on file  Social History Narrative   Lives with husband.  Ambulated independently.   Right-handed.   No daily caffeine use.   Social Determinants of Health   Financial Resource Strain: Low Risk    Difficulty of Paying Living Expenses: Not very hard  Food Insecurity: No Food  Insecurity   Worried About Charity fundraiser in the Last Year: Never true   Ran Out of Food in the Last Year: Never true  Transportation Needs: No Transportation Needs   Lack of Transportation (Medical): No   Lack of Transportation (Non-Medical): No  Physical Activity: Insufficiently Active   Days of Exercise per Week: 1 day   Minutes of Exercise per Session: 10 min  Stress: No Stress Concern Present   Feeling of Stress : Not at all  Social Connections: Moderately Integrated   Frequency of Communication with Friends and Family: More than three times a week   Frequency of Social Gatherings with Friends and Family: Once a week   Attends Religious Services: More than 4 times per year   Active Member of [employer] or Organizations: No   Attends Archivist Meetings: Never   Marital Status: Married  Human resources officer Violence: Not At Risk   Fear of Current or Ex-Partner: No   Emotionally Abused: No   Physically Abused: No   Sexually Abused: No    Review of Systems  Genitourinary:  Positive for vaginal bleeding.       Vaginal itching   All other systems reviewed and are negative.  PHYSICAL EXAMINATION:    BP 110/70 (BP Location: Right Arm, Patient  Position: Sitting, Cuff Size: Large)    Pulse 78    Resp 18     General appearance: alert, cooperative and appears stated age  Pelvic: External genitalia:  no lesions              Urethra:  normal appearing urethra with no masses, tenderness or lesions              Bartholins and Skenes: normal                 Vagina: normal appearing vagina with normal color and discharge, no lesions              Cervix: no lesions                Bimanual Exam:  Uterus:  normal size, contour, position, consistency, mobility, non-tender              Adnexa: no mass, fullness, tenderness         Chaperone was present for exam:  yes.  ASSESSMENT  Recent yeast vaginitis.  Renal disease.  Recent hematuria.  On Eliquis.  Hx postmenopausal  bleeding and cervical stenosis.  Status post hysteroscopy with dilation and curettage with US guidance.  Pathology was inactive endometrium and fragments of benign endometrial polyp.   PLAN  Wet prep today:  negative yeast, clue cells, and trichomonas.  I advised her that a prescription for weekly Diflucan was sent to her pharmacy after her urgent care visit.  Urinalysis and reflex culture.  Return for pelvic ultrasound.  Rationale explained.    An After Visit Summary was printed and given to the patient.  30 min  total time was spent for this patient encounter, including preparation, face-to-face counseling with the patient, coordination of care, and documentation of the encounter.

## 2021-04-29 NOTE — Telephone Encounter (Signed)
I spoke with [facility] and was told patient picked to the Diflucan #2 but that the long term Rx for #24 "had been cancelled". The Urgent Care note clearly indicates plan for patient to take it for 6 mos.  ? ?I called Urgent Care and spoke with [facility] and relayed to her that Rx cancelled and asked her if she could ask the PA that saw patient to resend the prescription. She said she would send a message to that provider regarding this. ? ?I called the patient and explained all of the above to her and told her I tried to help and I had called and spoken with Urgent Care and they said they will resend Rx. They want her to call them directly if any other issues with this Rx and I provided her the phone number and the name of the PA that she saw. ?

## 2021-04-29 NOTE — Telephone Encounter (Signed)
Please refer to the prescription in Epic.  ? ?The prescription was from [facility] [place], [place] on 04/20/21. ?The prescription states her address is [address]  ?Her phone number is [phone].  ? ?That provider will need to resend the prescription.  ?

## 2021-04-30 ENCOUNTER — Encounter: Payer: Medicare HMO | Attending: Physical Medicine & Rehabilitation | Admitting: Physical Medicine & Rehabilitation

## 2021-04-30 ENCOUNTER — Other Ambulatory Visit: Payer: Self-pay

## 2021-04-30 ENCOUNTER — Encounter: Payer: Self-pay | Admitting: Physical Medicine & Rehabilitation

## 2021-04-30 VITALS — BP 138/77 | HR 89 | Temp 98.5°F | Ht 67.0 in | Wt 241.0 lb

## 2021-04-30 DIAGNOSIS — M170 Bilateral primary osteoarthritis of knee: Secondary | ICD-10-CM | POA: Insufficient documentation

## 2021-04-30 NOTE — Telephone Encounter (Signed)
Encounter reviewed and closed.  

## 2021-04-30 NOTE — Progress Notes (Signed)
Indication end-stage osteoarthritis of the RIGHT knee with pain that limits mobility and does not respond to oral medications. ? ?Ultrasound guidance, 15 Hz linear transducer, long axis view ? ?Medial aspect of the knee was imaged, identified joint space, identified patella, femur, tibia. 25-gauge 1.5 inch needle was inserted under ultrasound guidance and 3 mL of 1% lidocaine were infiltrated into the skin and subcutaneous tissue. Then a 21-gauge, 2 inch needle was inserted along the same needle track Into the joint under direct ultrasound visualization.  6 mL of Synvisc-1 were injected. Patient tolerated procedure well ?Post procedure instructions given ? ?Last Left knee Synvisc was on 01/27/21- will repeat in 3 mo ? ?

## 2021-04-30 NOTE — Patient Instructions (Signed)
Hylan G-F 20 intra-articular injection ?What is this medication? ?HYLAN G-F 20 (HI lan G F 20) is used to treat osteoarthritis of the knee. It lubricates and cushions the joint, reducing pain in the knee. ?This medicine may be used for other purposes; ask your health care provider or pharmacist if you have questions. ?COMMON BRAND NAME(S): Synvisc, Synvisc-One ?What should I tell my care team before I take this medication? ?They need to know if you have any of these conditions: ?severe knee inflammation ?skin conditions or sensitivity ?skin or joint infection ?venous stasis ?an unusual or allergic reaction to hylan G-F 20, hyaluronan (sodium hyaluronate), eggs, other medicines, foods, dyes, or preservatives ?pregnant or trying to get pregnant ?breast-feeding ?How should I use this medication? ?This medicine is for injection into the knee joint. It is given by a health care professional in a hospital or clinic setting. ?Talk to your pediatrician regarding the use of this medicine in children. This medicine is not approved for use in children. ?Overdosage: If you think you have taken too much of this medicine contact a poison control center or emergency room at once. ?NOTE: This medicine is only for you. Do not share this medicine with others. ?What if I miss a dose? ?Keep appointments for follow-up doses as directed. For Synvisc, you will need weekly injections for 3 doses. It is important not to miss your dose. If you will receive Synvisc-One, then only 1 injection will be needed. Call your doctor or health care professional if you are unable to keep an appointment. ?What may interact with this medication? ?Do not take this medicine with any of the following medications: ?other injections for the joint like steroids or anesthetics ?certain skin disinfectants like benzalkonium chloride ?This list may not describe all possible interactions. Give your health care provider a list of all the medicines, herbs,  non-prescription drugs, or dietary supplements you use. Also tell them if you smoke, drink alcohol, or use illegal drugs. Some items may interact with your medicine. ?What should I watch for while using this medication? ?Tell your doctor or healthcare professional if your symptoms do not start to get better or if they get worse. Your condition will be monitored carefully while you are receiving this medicine. Most persons get pain relief for up to 6 months after treatment. ?Avoid strenuous activities (high-impact sports, jogging) or major weight-bearing activities for 48 hours after the injection. ?What side effects may I notice from receiving this medication? ?Side effects that you should report to your doctor or health care professional as soon as possible: ?allergic reactions like skin rash, itching or hives, swelling of the face, lips, or tongue ?difficulty breathing ?fever or chills ?severe joint pain or swelling ?unusual bleeding or bruising ?Side effects that usually do not require medical attention (report to your doctor or health care professional if they continue or are bothersome): ?dizziness ?flushing ?general ill feeling or flu-like symptoms ?headache ?minor joint pain or swelling ?muscle pain or cramps ?pain, redness, irritation or bruising at site of injection ?This list may not describe all possible side effects. Call your doctor for medical advice about side effects. You may report side effects to FDA at 1-800-FDA-1088. ?Where should I keep my medication? ?This drug is given in a hospital or clinic and will not be stored at home. ?NOTE: This sheet is a summary. It may not cover all possible information. If you have questions about this medicine, talk to your doctor, pharmacist, or health care provider. ?? 2022  Elsevier/Gold Standard (2015-03-20 00:00:00) ? ?

## 2021-05-01 NOTE — Progress Notes (Signed)
ACUTE VISIT Chief Complaint  Patient presents with   sugar in urine   HPI: [name] is a 76 y.o. female, who is here today with her husband complaining of sugar in urine. Evaluated by gyn, UA was done, + glucose, she was instructed to follow with PCP> HFrEF, she is on on Farxiga. She is also on Spironolactone 25 mg and Entresto. Negative for abdominal pain, nausea,vomiting, polydipsia,polyuria, or polyphagia.  Lab Results  Component Value Date   HGBA1C 5.1 12/08/2020   She was seen on 04/13/21 because of diarrhea, it has resolved. She is c/o recurrent yeast infections and thinks Ozempic is causing problem. Ozempic was prescribed by her cardiologist to help with wt loss. Atrial fib on Eliquis 5 mg bid. Hyperthyroidism on Methimazole 5 mg 3 tabs daily. Lab Results  Component Value Date   TSH 0.63 04/13/2021   She was evaluated in the ED on 04/18/21 and Diflucan Rx was sent. She states that she did not get medication because pharmacy did not have it. She has seen gyn for this problem on 04/28/21. + Vaginal pruritus and some discharge.  She does not exercise regularly due to mobility issues and chronic pain, planning on starting walking on the treadmill. OA, she is getting intraarticular joint injection. She feels like she does "not eat much."  Yesterday she had coffee with cream and sugar for breakfast, 1/2 sweet potatoes and fish for lunch, does not remember dinner. 2 days ago coffee with cream and a bowl of cereal with milk, doe snot remember lunch or dinner. Snacks on popcorn and Oreo cockles. When she eats out she eat mostly salads.  Aortic atherosclerosis seen on imaging,abdominal CT 07/2018. She is on Crestor 40 mg daily.  Lab Results  Component Value Date   CHOL 142 02/10/2021   HDL 63 02/10/2021   LDLCALC 67 02/10/2021   TRIG 59 02/10/2021   CHOLHDL 2.3 02/10/2021   Review of Systems  Constitutional:  Positive for fatigue. Negative for  chills and fever.  Respiratory:  Negative for cough, shortness of breath and wheezing.   Cardiovascular:  Negative for chest pain.  Genitourinary:  Negative for decreased urine volume, dysuria, hematuria and vaginal bleeding.  Musculoskeletal:  Positive for arthralgias and gait problem.  Neurological:  Negative for syncope and headaches.  Psychiatric/Behavioral:  Negative for confusion. The patient is nervous/anxious.   Rest see pertinent positives and negatives per HPI.  Current Outpatient Medications on File Prior to Visit  Medication Sig Dispense Refill   albuterol (PROVENTIL HFA;VENTOLIN HFA) 108 (90 Base) MCG/ACT inhaler Inhale 2 puffs into the lungs every 6 (six) hours as needed for wheezing or shortness of breath.     alendronate (FOSAMAX) 70 MG tablet Take 1 tablet (70 mg total) by mouth once a week. Take with a full glass of water on an empty stomach. 13 tablet 3   apixaban (ELIQUIS) 5 MG TABS tablet Take 1 tablet (5 mg total) by mouth 2 (two) times daily. 180 tablet 3   ascorbic acid (VITAMIN C) 500 MG tablet Take 500 mg by mouth daily.     budesonide-formoterol (SYMBICORT) 160-4.5 MCG/ACT inhaler Inhale 2 puffs into the lungs daily as needed. 3 each 3   carvedilol (COREG) 12.5 MG tablet Take 1.5 tablets (18.75 mg total) by mouth 2 (two) times daily with a meal. 90 tablet 3   Cholecalciferol 25 MCG (1000 UT) tablet Take 1,000 Units by mouth daily.     dapagliflozin propanediol (FARXIGA) 10 MG  TABS tablet Take 1 tablet (10 mg total) by mouth daily before breakfast. 30 tablet 11   fluconazole (DIFLUCAN) 150 MG tablet Take 1 tablet today.  Take second tablet 3 days later. 2 tablet 0   fluconazole (DIFLUCAN) 150 MG tablet Take 1 tablet (150 mg total) by mouth once a week for 24 doses. 24 tablet 0   fluticasone (FLONASE) 50 MCG/ACT nasal spray Place 1 spray into both nostrils daily. 16 g 3   methimazole (TAPAZOLE) 5 MG tablet Take 3 tablets (15 mg total) by mouth daily. 270 tablet 3    pantoprazole (PROTONIX) 40 MG tablet Take 1 tablet (40 mg total) by mouth daily. 30 tablet 11   rosuvastatin (CRESTOR) 40 MG tablet Take 1 tablet (40 mg total) by mouth daily. 90 tablet 3   sacubitril-valsartan (ENTRESTO) 49-51 MG Take 1 tablet by mouth 2 (two) times daily. 60 tablet 4   Semaglutide,0.25 or 0.5MG /DOS, (OZEMPIC, 0.25 OR 0.5 MG/DOSE,) 2 MG/1.5ML SOPN Inject 0.25mg  subcutaneously weekly for 4 weeks, then increase to 0.5mg  weekly for 4 weeks 1.5 mL 1   senna-docusate (SENOKOT-S) 8.6-50 MG tablet Take 2 tablets by mouth at bedtime.     spironolactone (ALDACTONE) 25 MG tablet Take 1 tablet (25 mg total) by mouth daily. 90 tablet 3   terconazole (TERAZOL 7) 0.4 % vaginal cream Place 1 applicator vaginally at bedtime. 45 g 0   torsemide (DEMADEX) 20 MG tablet Take 60mg  daily alternating with 40mg  every other day 420 tablet 2   traMADol (ULTRAM) 50 MG tablet TAKE 1 TABLET BY MOUTH THREE TIMES DAILY 90 tablet 5   trolamine salicylate (ASPERCREME) 10 % cream Apply 1 application topically as needed for muscle pain.     valACYclovir (VALTREX) 500 MG tablet Take on tablet by mouth twice a day for 3 days as needed for an outbreak 30 tablet 0   zolpidem (AMBIEN) 5 MG tablet Take 0.5-1 tablets (2.5-5 mg total) by mouth at bedtime as needed for sleep. 15 tablet 0   No current facility-administered medications on file prior to visit.   Past Medical History:  Diagnosis Date   Anticoagulated on Coumadin    managed by cardiology   Anxiety    Chronic constipation    Chronic systolic CHF (congestive heart failure) ([place]) 2016   cardiologist--- dr end and followed by CHF clinic   CKD (chronic kidney disease), stage III ([place])    COPD (chronic obstructive pulmonary disease) ([place])    previous had seen pulmonology --- dr Melvyn Novas, note in epic 03-14-2016, dx COPD GOLD 0   Coronary artery disease    cardiologist--- dr Durward Mallard;  last cardiac cath 01-10-20202 mild nonobstructive disease proxLAD;  per pt, had LHC  prior to AVR in [place] 2016   DOE (dyspnea on exertion)    GERD (gastroesophageal reflux disease)    History of 2019 novel coronavirus disease (COVID-19) 12/19/2019   hospital admission w/ covid pneumonia , no intubatiion,  per pt symptoms resolved and back to baseline   History of cerebrovascular accident (CVA) with residual deficit    CVA in 2000 without residuals;  CVA post op AVR 01/ 2016 with residual right hand weakness   History of gastritis    History of pneumothorax    s/p right vats 11-19-2014 and  s/p left vats  12-13-2015   (both spontenaous due to bleb)   Hyperlipidemia    Hypertension    Hyperthyroidism    per pt followed by pcp,  dx approx 2014  Malignant neoplasm of upper-outer quadrant of left breast in female, estrogen receptor positive ([place]) 11/2016   oncologist-- dr Burr Medico--- dx 10/ 2018  ,  s/p left lumptectomy with node dissection;  completed radiation 04-15-2017, no chemo   Multiple thyroid nodules    in care everywhere pt had left thyroid nodule biospy 09-18-2014 benign   NICM (nonischemic cardiomyopathy) ([place]) 2016   2016  ef 30%;  last echo in epic ef 45%   OA (osteoarthritis)    Osteoporosis    Paroxysmal atrial fibrillation [facility])    cardiologist--- dr end   Personal history of radiation therapy    left breast cancer  03-08-2017  to 04-05-2017   Thrombocytopenia [facility])    followed by dr Burr Medico   Valvular stenosis, aortic 03/12/2014   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   Wears dentures    full upper   Allergies  Allergen Reactions   Lisinopril Cough   Tetanus Toxoid Adsorbed Swelling    Arm swelling   Other Cough    PEANUTS    Social History   Socioeconomic History   Marital status: Married    Spouse name: Sherwood   Number of children: 0   Years of education: 12   Highest education level: Bachelor's degree (e.g., BA, AB, BS)  Occupational History   Occupation: Retired in 2004  Tobacco Use   Smoking status: Former     Packs/day: 0.25    Years: 10.00    Pack years: 2.50    Types: Cigarettes    Quit date: 2012    Years since quitting: 11.1   Smokeless tobacco: Never  Vaping Use   Vaping Use: Never used  Substance and Sexual Activity   Alcohol use: No   Drug use: Never   Sexual activity: Yes    Birth control/protection: Post-menopausal  Other Topics Concern   Not on file  Social History Narrative   Lives with husband.  Ambulated independently.   Right-handed.   No daily caffeine use.   Social Determinants of Health   Financial Resource Strain: Low Risk    Difficulty of Paying Living Expenses: Not very hard  Food Insecurity: No Food Insecurity   Worried About Charity fundraiser in the Last Year: Never true   Ran Out of Food in the Last Year: Never true  Transportation Needs: No Transportation Needs   Lack of Transportation (Medical): No   Lack of Transportation (Non-Medical): No  Physical Activity: Insufficiently Active   Days of Exercise per Week: 1 day   Minutes of Exercise per Session: 10 min  Stress: No Stress Concern Present   Feeling of Stress : Not at all  Social Connections: Moderately Integrated   Frequency of Communication with Friends and Family: More than three times a week   Frequency of Social Gatherings with Friends and Family: Once a week   Attends Religious Services: More than 4 times per year   Active Member of Clubs or Organizations: No   Attends Archivist Meetings: Never   Marital Status: Married   Vitals:   05/04/21 1110  BP: 130/80  Pulse: 85  Resp: 16  SpO2: 97%   Wt Readings from Last 3 Encounters:  05/04/21 241 lb (109.3 kg)  04/30/21 241 lb (109.3 kg)  04/14/21 251 lb 12.8 oz (114.2 kg)   Body mass index is 37.75 kg/m.  Physical Exam Vitals and nursing note reviewed.  Constitutional:  General: She is not in acute distress.    Appearance: She is well-developed.  HENT:     Head: Normocephalic and atraumatic.  Eyes:      Conjunctiva/sclera: Conjunctivae normal.  Cardiovascular:     Rate and Rhythm: Normal rate and regular rhythm.     Heart sounds: No murmur heard.    Comments: Peri ankle edema, bilateral. Pulmonary:     Effort: Pulmonary effort is normal. No respiratory distress.     Breath sounds: Normal breath sounds.  Abdominal:     Palpations: Abdomen is soft.     Tenderness: There is no abdominal tenderness.  Skin:    General: Skin is warm.     Findings: No erythema or rash.  Neurological:     General: No focal deficit present.     Mental Status: She is alert and oriented to person, place, and time.     Comments: In a wheel chair.  Psychiatric:        Mood and Affect: Mood is anxious.     Comments: Well groomed, good eye contact.   ASSESSMENT AND PLAN:  Danielle [name] was seen today for sugar in urine.  Diagnoses and all orders for this visit: Orders Placed This Encounter  Procedures   POC HgB A1c   Lab Results  Component Value Date   HGBA1C 5.2 05/04/2021   Glycosuria Reassured. She is on SGLT2 inh, [place]. HgA1C done as requested by pt, normal.  Morbid obesity ([place]) She has lost some wt since Ozempic was started. Dietary habits in the past 48 hours discussed, a few healthier recommendations given.  Candidal vulvovaginitis Recurrent candida vaginitis. She has an appt with her gyn in a few days. Some side effects of Farxiga discussed as well as diflucan. We called pharmacy while she was in the office and it was verified that she did pick up Rx for Diflucan x 2 tabs. Recommend continuing topical treatment with Monistat. Some side effects of SGLT2 inh discussed, may need to d/c if problem persists.  Aortic atherosclerosis (HCC) Continue Crestor 40 mg daily.  Paroxysmal atrial fibrillation (HCC) On Eliquis 5 mg bid. Following with cardiologist.  Return if symptoms worsen or fail to improve, for Keep next appt..  Kamira Mellette G. [name], MD  [facility]. [place]  office.

## 2021-05-04 ENCOUNTER — Ambulatory Visit (INDEPENDENT_AMBULATORY_CARE_PROVIDER_SITE_OTHER): Payer: Medicare HMO | Admitting: Family Medicine

## 2021-05-04 ENCOUNTER — Encounter: Payer: Self-pay | Admitting: Family Medicine

## 2021-05-04 VITALS — BP 130/80 | HR 85 | Resp 16 | Ht 67.0 in | Wt 241.0 lb

## 2021-05-04 DIAGNOSIS — I700 Atherosclerosis of aorta: Secondary | ICD-10-CM

## 2021-05-04 DIAGNOSIS — R81 Glycosuria: Secondary | ICD-10-CM

## 2021-05-04 DIAGNOSIS — I480 Paroxysmal atrial fibrillation: Secondary | ICD-10-CM

## 2021-05-04 DIAGNOSIS — B3731 Acute candidiasis of vulva and vagina: Secondary | ICD-10-CM | POA: Diagnosis not present

## 2021-05-04 LAB — POCT GLYCOSYLATED HEMOGLOBIN (HGB A1C): Hemoglobin A1C: 5.2 % (ref 4.0–5.6)

## 2021-05-04 NOTE — Patient Instructions (Addendum)
A few things to remember from today's visit: ? ? ?Glycosuria - Plan: POC HgB A1c ? ?If you need refills please call your pharmacy. ?Do not use My Chart to request refills or for acute issues that need immediate attention. ?  ?Lab Results  ?Component Value Date  ? HGBA1C 5.1 12/08/2020  ? ?Farxiga causes sugar in urine. ?Use over the counter monistat. ?Caution with falls when you start exercising. ?Diflucan was sent and you picked it up, according to pharmacist. ? ?Please be sure medication list is accurate. ?If a new problem present, please set up appointment sooner than planned today. ? ? ? ?

## 2021-05-07 ENCOUNTER — Other Ambulatory Visit (HOSPITAL_COMMUNITY): Payer: Self-pay

## 2021-05-12 ENCOUNTER — Other Ambulatory Visit: Payer: Self-pay

## 2021-05-12 ENCOUNTER — Encounter (HOSPITAL_COMMUNITY): Payer: Medicare Other

## 2021-05-15 ENCOUNTER — Telehealth: Payer: Medicare HMO

## 2021-05-18 NOTE — Progress Notes (Signed)
? ?Follow-up Outpatient Visit ?Date: 05/20/2021 ? ?Primary Care Provider: ?[name], [name], MD ?[place] ?[place] [address] ? ?Chief Complaint: Follow-up heart failure ? ?HPI:  [name] is a 76 y.o. female with history of aortic valve disease (severe regurgitation by her description) s/p bioprosthetic aortic valve replacement in [place] in 03/2014, NICM with LVEF as low as 30-35% by report in 03/2015, paroxysmal atrial fibrillation on chronic warfarin, stroke x 2, hyperlipidemia, hypertension, hyperthyroidism, left breast cancer, spontaneous pneumothorax, and left hip fracture, who presents for follow-up of HFrEF and valvular heart disease.  I last saw her in 10/2020, at which time she was feeling a little better than at prior visits, though she continued to complain of shortness of breath with mild activity.  Subsequent cardiac MRI showed LVEF 41% with late gadolinium enhancements most suggestive of LAD infarct though pattern was somewhat atypical.  Prior catheterization in 03/2020 also showed no significant CAD.  She has since established with Dr. Aundra Dubin in the AHF clinic, having last been seen on 04/14/2021.  She was continued on her current medications at that time. ? ?Today, Ms. Balcerzak reports that she is not feeling that well.  Her stomach has been somewhat upset and gassy.  She also notes frequent loose stools over the last few weeks.  She notes that she was recently started on Ozempic by Dr. Aundra Dubin, which has helped her lose weight.  She denies chest pain, shortness of breath, palpitations, lightheadedness, and edema.  She is only using her torsemide on an as-needed basis when she feels like she is retaining fluid.  She notes small amount of bleeding from her right ear when cleaning the ear with a Q-tip. ? ?-------------------------------------------------------------------------------------------------- ? ?Cardiovascular History & Procedures: ?Cardiovascular Problems: ?Aortic  valve disease status post bioprosthetic AVR in 03/2014 ?Non-ischemic cardiomyopathy ?Paroxysmal atrial fibrillation ?Stroke ?  ?Risk Factors: ?Hypertension, hyperlipidemia, stroke, and age > 67 ?  ?Cath/PCI: ?R/LHC (03/10/2020): No angiographically significant coronary artery disease.  Mildly elevated left heart, right heart, and PA pressures.  Normal Fick cardiac output/index.  No significant AVR gradient. ?R/LHC (02/12/2014, [facility]): Moderate proximal LAD calcification with mild, nonobstructive disease.  Mildly reduced LVEF with severe apical hypokinesis.  Normal pulmonary artery pressure. ?  ?CV Surgery: ?Bioprosthetic aortic valve replacement (03/12/14, [facility], [place], [place]) ?  ?EP Procedures and Devices: ?None ?  ?Non-Invasive Evaluation(s): ?TTE (02/10/2021): Normal LV size with mild LVH.  LVEF 35%.  Normal RV size and function.  Bioprosthetic AVR with mean gradient 10 mmHg. ?Cardiac MRI (12/01/2020): Normal LV size with mid-apical inferolateral and mid-apical inferior akinesis and thinning.  LVEF 41%.  Normal RV size and function.  LGE pattent suggests CAD with subendocardial LGE.  However, distribution is unusual (? occlusion of distal large wrap-around LAD). ?TTE (11/21/2019): Normal LV size with mild LVH.  LVEF 45-50% with global hypokinesis.  Normal RV size and function.  Bioprosthetic aortic valve present with appropriate function (mean gradient 10 mmHg). ?TTE (01/18/2019): Normal LV size with LVEF 35-40%.  Mild LVH.  Grade 1 diastolic dysfunction.  Is in function.  Bioprosthetic aortic valve with normal function.  Normal PA pressure. ?TTE (04/21/16): Normal obese size with moderate LVH. LVEF 35-40% with mid and apical anteroseptal hypokinesis. Grade 3 diastolic dysfunction noted. Aortic valve bioprosthesis present with a mean gradient of 11 mmHg. Mitral annular calcification noted. Normal RV size and function. Mild right atrial enlargement. ?TTE (03/27/15, OSH): Mild LVH with LVEF 30-35%,  mild left atrial enlargement, AVR in  place without regurgitation. ?TTE (05/10/14, OSH): LVEF 45-50%. ? ?Recent CV Pertinent Labs: ?Lab Results  ?Component Value Date  ? CHOL 142 02/10/2021  ? HDL 63 02/10/2021  ? [place] 67 02/10/2021  ? TRIG 59 02/10/2021  ? CHOLHDL 2.3 02/10/2021  ? INR 1.2 12/17/2020  ? BNP 68.5 02/10/2021  ? K 4.8 04/13/2021  ? K 4.3 12/22/2016  ? MG 1.9 12/22/2019  ? BUN 20 04/13/2021  ? BUN 11 02/27/2020  ? BUN 19.5 12/22/2016  ? CREATININE 1.52 (H) 04/13/2021  ? CREATININE 1.40 (H) 11/21/2020  ? CREATININE 1.09 (H) 12/31/2019  ? CREATININE 1.2 (H) 12/22/2016  ? ? ?Past medical and surgical history were reviewed and updated in EPIC. ? ?Current Meds  ?Medication Sig  ? albuterol (PROVENTIL HFA;VENTOLIN HFA) 108 (90 Base) MCG/ACT inhaler Inhale 2 puffs into the lungs every 6 (six) hours as needed for wheezing or shortness of breath.  ? alendronate (FOSAMAX) 70 MG tablet Take 1 tablet (70 mg total) by mouth once a week. Take with a full glass of water on an empty stomach.  ? apixaban (ELIQUIS) 5 MG TABS tablet Take 1 tablet (5 mg total) by mouth 2 (two) times daily.  ? ascorbic acid (VITAMIN C) 500 MG tablet Take 500 mg by mouth daily.  ? budesonide-formoterol (SYMBICORT) 160-4.5 MCG/ACT inhaler Inhale 2 puffs into the lungs daily as needed.  ? carvedilol (COREG) 12.5 MG tablet Take 1.5 tablets (18.75 mg total) by mouth 2 (two) times daily with a meal.  ? Cholecalciferol 25 MCG (1000 UT) tablet Take 1,000 Units by mouth daily.  ? dapagliflozin propanediol (FARXIGA) 10 MG TABS tablet Take 1 tablet (10 mg total) by mouth daily before breakfast.  ? fluconazole (DIFLUCAN) 150 MG tablet Take 1 tablet today.  Take second tablet 3 days later.  ? fluconazole (DIFLUCAN) 150 MG tablet Take 1 tablet (150 mg total) by mouth once a week for 24 doses.  ? fluticasone (FLONASE) 50 MCG/ACT nasal spray Place 1 spray into both nostrils daily.  ? methimazole (TAPAZOLE) 5 MG tablet Take 3 tablets (15 mg total) by  mouth daily.  ? pantoprazole (PROTONIX) 40 MG tablet Take 1 tablet (40 mg total) by mouth daily.  ? rosuvastatin (CRESTOR) 40 MG tablet Take 1 tablet (40 mg total) by mouth daily.  ? sacubitril-valsartan (ENTRESTO) 49-51 MG Take 1 tablet by mouth 2 (two) times daily.  ? Semaglutide,0.25 or 0.'5MG'$ /DOS, (OZEMPIC, 0.25 OR 0.5 MG/DOSE,) 2 MG/1.5ML SOPN Inject 0.'25mg'$  subcutaneously weekly for 4 weeks, then increase to 0.'5mg'$  weekly for 4 weeks  ? senna-docusate (SENOKOT-S) 8.6-50 MG tablet Take 2 tablets by mouth at bedtime.  ? spironolactone (ALDACTONE) 25 MG tablet Take 1 tablet (25 mg total) by mouth daily.  ? terconazole (TERAZOL 7) 0.4 % vaginal cream Place 1 applicator vaginally at bedtime.  ? torsemide (DEMADEX) 20 MG tablet Take '60mg'$  daily alternating with '40mg'$  every other day  ? traMADol (ULTRAM) 50 MG tablet TAKE 1 TABLET BY MOUTH THREE TIMES DAILY  ? trolamine salicylate (ASPERCREME) 10 % cream Apply 1 application topically as needed for muscle pain.  ? valACYclovir (VALTREX) 500 MG tablet Take on tablet by mouth twice a day for 3 days as needed for an outbreak  ? zolpidem (AMBIEN) 5 MG tablet Take 0.5-1 tablets (2.5-5 mg total) by mouth at bedtime as needed for sleep.  ? ? ?Allergies: Lisinopril, Tetanus toxoid adsorbed, and Other ? ?Social History  ? ?Tobacco Use  ? Smoking status: Former  ?  Packs/day: 0.25  ?  Years: 10.00  ?  Pack years: 2.50  ?  Types: Cigarettes  ?  Quit date: 2012  ?  Years since quitting: 11.2  ? Smokeless tobacco: Never  ?Vaping Use  ? Vaping Use: Never used  ?Substance Use Topics  ? Alcohol use: No  ? Drug use: Never  ? ? ?Family History  ?Problem Relation Age of Onset  ? Diabetes Mother   ? Heart attack Mother 33  ? Diabetes Father   ? Lung cancer Father   ? Diabetes Sister   ? Thyroid disease Sister   ? Diabetes Sister   ? HIV Brother   ? Colon polyps Neg Hx   ? Colon cancer Neg Hx   ? ? ?Review of Systems: ?A 12-system review of systems was performed and was negative except as  noted in the HPI. ? ?-------------------------------------------------------------------------------------------------- ? ?Physical Exam: ?BP 102/70 (BP Location: Right Arm, Patient Position: Sitting, Cuff Size:

## 2021-05-20 ENCOUNTER — Ambulatory Visit: Payer: Medicare HMO | Admitting: Internal Medicine

## 2021-05-20 ENCOUNTER — Other Ambulatory Visit
Admission: RE | Admit: 2021-05-20 | Discharge: 2021-05-20 | Disposition: A | Discharge status: Home / Self Care | Payer: Medicare HMO | Source: Ambulatory Visit | Attending: Internal Medicine | Admitting: Internal Medicine

## 2021-05-20 ENCOUNTER — Other Ambulatory Visit: Payer: Self-pay

## 2021-05-20 ENCOUNTER — Encounter: Payer: Self-pay | Admitting: Internal Medicine

## 2021-05-20 ENCOUNTER — Other Ambulatory Visit (HOSPITAL_COMMUNITY): Payer: Self-pay

## 2021-05-20 VITALS — BP 102/70 | HR 96 | Ht 67.0 in | Wt 239.0 lb

## 2021-05-20 DIAGNOSIS — I5022 Chronic systolic (congestive) heart failure: Secondary | ICD-10-CM

## 2021-05-20 DIAGNOSIS — I480 Paroxysmal atrial fibrillation: Secondary | ICD-10-CM | POA: Diagnosis not present

## 2021-05-20 DIAGNOSIS — E785 Hyperlipidemia, unspecified: Secondary | ICD-10-CM

## 2021-05-20 DIAGNOSIS — N183 Chronic kidney disease, stage 3 unspecified: Secondary | ICD-10-CM | POA: Diagnosis not present

## 2021-05-20 DIAGNOSIS — N1832 Chronic kidney disease, stage 3b: Secondary | ICD-10-CM

## 2021-05-20 LAB — BASIC METABOLIC PANEL
Anion gap: 4 — ABNORMAL LOW (ref 5–15)
BUN: 22 mg/dL (ref 8–23)
CO2: 23 mmol/L (ref 22–32)
Calcium: 10.2 mg/dL (ref 8.9–10.3)
Chloride: 110 mmol/L (ref 98–111)
Creatinine, Ser: 1.83 mg/dL — ABNORMAL HIGH (ref 0.44–1.00)
GFR, Estimated: 28 mL/min — ABNORMAL LOW (ref >60)
Glucose, Bld: 99 mg/dL (ref 70–99)
Potassium: 4.6 mmol/L (ref 3.5–5.1)
Sodium: 137 mmol/L (ref 135–145)

## 2021-05-20 NOTE — Patient Instructions (Signed)
Medication Instructions:  ? ?Your physician recommends that you continue on your current medications as directed. Please refer to the Current Medication list given to you today. ? ?*If you need a refill on your cardiac medications before your next appointment, please call your pharmacy* ? ? ?Lab Work: ? ?Today at the medical mall: BMET  ? ?If you have labs (blood work) drawn today and your tests are completely normal, you will receive your results only by: ?MyChart Message (if you have MyChart) OR ?A paper copy in the mail ?If you have any lab test that is abnormal or we need to change your treatment, we will call you to review the results. ? ? ?Testing/Procedures: ? ?None ordered ? ? ?Follow-Up: ?At [facility], you and your health needs are our priority.  As part of our continuing mission to provide you with exceptional heart care, we have created designated Provider Care Teams.  These Care Teams include your primary Cardiologist (physician) and Advanced Practice Providers (APPs -  Physician Assistants and Nurse Practitioners) who all work together to provide you with the care you need, when you need it. ? ?We recommend signing up for the patient portal called "MyChart".  Sign up information is provided on this After Visit Summary.  MyChart is used to connect with patients for Virtual Visits (Telemedicine).  Patients are able to view lab/test results, encounter notes, upcoming appointments, etc.  Non-urgent messages can be sent to your provider as well.   ?To learn more about what you can do with MyChart, go to [email or web address].   ? ?Your next appointment:   ?6 month(s) ? ?The format for your next appointment:   ?In Person ? ?Provider:   ?You may see [name], MD or one of the following Advanced Practice Providers on your designated Care Team:   ?Murray Hodgkins, NP ?Christell Faith, PA-C ?Cadence Kathlen Mody, PA-C ?

## 2021-05-21 ENCOUNTER — Other Ambulatory Visit: Payer: Medicare HMO

## 2021-05-21 ENCOUNTER — Other Ambulatory Visit: Payer: Medicare HMO | Admitting: Obstetrics and Gynecology

## 2021-05-22 ENCOUNTER — Telehealth: Payer: Self-pay | Admitting: *Deleted

## 2021-05-22 ENCOUNTER — Ambulatory Visit (INDEPENDENT_AMBULATORY_CARE_PROVIDER_SITE_OTHER): Payer: Medicare HMO

## 2021-05-22 DIAGNOSIS — G8929 Other chronic pain: Secondary | ICD-10-CM

## 2021-05-22 DIAGNOSIS — I5022 Chronic systolic (congestive) heart failure: Secondary | ICD-10-CM

## 2021-05-22 DIAGNOSIS — I10 Essential (primary) hypertension: Secondary | ICD-10-CM

## 2021-05-22 DIAGNOSIS — E782 Mixed hyperlipidemia: Secondary | ICD-10-CM

## 2021-05-22 DIAGNOSIS — I480 Paroxysmal atrial fibrillation: Secondary | ICD-10-CM

## 2021-05-22 NOTE — Telephone Encounter (Signed)
Attempted to call pt to discuss results and Dr. Darnelle Bos recc below.  ?No answer at this time. Lmtcb.  ?

## 2021-05-22 NOTE — Chronic Care Management (AMB) (Signed)
?Chronic Care Management  ? ?CCM RN Visit Note ? ?05/22/2021 ?Name: Danielle [name] MRN: [id number] DOB: 26-Jul-1945 ? ?Subjective: ?Danielle [name] is a 76 y.o. year old female who is a primary care patient of Danielle [name], Danielle So, MD. The care management team was consulted for assistance with disease management and care coordination needs.   ? ?Engaged with patient by telephone for follow up visit in response to provider referral for case management and/or care coordination services.  ? ?Consent to Services:  ?The patient was given information about Chronic Care Management services, agreed to services, and gave verbal consent prior to initiation of services.  Please see initial visit note for detailed documentation.  ? ?Patient agreed to services and verbal consent obtained.  ? ?Assessment: Review of patient past medical history, allergies, medications, health status, including review of consultants reports, laboratory and other test data, was performed as part of comprehensive evaluation and provision of chronic care management services.  ? ?SDOH (Social Determinants of Health) assessments and interventions performed:   ? ?CCM Care Plan ? ?Allergies  ?Allergen Reactions  ? Lisinopril Cough  ? Tetanus Toxoid Adsorbed Swelling  ?  Arm swelling  ? Other Cough  ?  PEANUTS  ? ? ?Outpatient Encounter Medications as of 05/22/2021  ?Medication Sig  ? albuterol (PROVENTIL HFA;VENTOLIN HFA) 108 (90 Base) MCG/ACT inhaler Inhale 2 puffs into the lungs every 6 (six) hours as needed for wheezing or shortness of breath.  ? alendronate (FOSAMAX) 70 MG tablet Take 1 tablet (70 mg total) by mouth once a week. Take with a full glass of water on an empty stomach.  ? apixaban (ELIQUIS) 5 MG TABS tablet Take 1 tablet (5 mg total) by mouth 2 (two) times daily.  ? ascorbic acid (VITAMIN C) 500 MG tablet Take 500 mg by mouth daily.  ? budesonide-formoterol (SYMBICORT) 160-4.5 MCG/ACT inhaler Inhale 2 puffs into the lungs  daily as needed.  ? carvedilol (COREG) 12.5 MG tablet Take 1.5 tablets (18.75 mg total) by mouth 2 (two) times daily with a meal.  ? Cholecalciferol 25 MCG (1000 UT) tablet Take 1,000 Units by mouth daily.  ? dapagliflozin propanediol (FARXIGA) 10 MG TABS tablet Take 1 tablet (10 mg total) by mouth daily before breakfast.  ? fluconazole (DIFLUCAN) 150 MG tablet Take 1 tablet today.  Take second tablet 3 days later.  ? fluconazole (DIFLUCAN) 150 MG tablet Take 1 tablet (150 mg total) by mouth once a week for 24 doses.  ? fluticasone (FLONASE) 50 MCG/ACT nasal spray Place 1 spray into both nostrils daily.  ? methimazole (TAPAZOLE) 5 MG tablet Take 3 tablets (15 mg total) by mouth daily.  ? pantoprazole (PROTONIX) 40 MG tablet Take 1 tablet (40 mg total) by mouth daily.  ? rosuvastatin (CRESTOR) 40 MG tablet Take 1 tablet (40 mg total) by mouth daily.  ? sacubitril-valsartan (ENTRESTO) 49-51 MG Take 1 tablet by mouth 2 (two) times daily.  ? Semaglutide,0.25 or 0.'5MG'$ /DOS, (OZEMPIC, 0.25 OR 0.5 MG/DOSE,) 2 MG/1.5ML SOPN Inject 0.'25mg'$  subcutaneously weekly for 4 weeks, then increase to 0.'5mg'$  weekly for 4 weeks (Patient not taking: Reported on 05/22/2021)  ? senna-docusate (SENOKOT-S) 8.6-50 MG tablet Take 2 tablets by mouth at bedtime.  ? spironolactone (ALDACTONE) 25 MG tablet Take 1 tablet (25 mg total) by mouth daily.  ? terconazole (TERAZOL 7) 0.4 % vaginal cream Place 1 applicator vaginally at bedtime.  ? torsemide (DEMADEX) 20 MG tablet Take '60mg'$  daily alternating with '40mg'$  every other day  ?  traMADol (ULTRAM) 50 MG tablet TAKE 1 TABLET BY MOUTH THREE TIMES DAILY  ? trolamine salicylate (ASPERCREME) 10 % cream Apply 1 application topically as needed for muscle pain.  ? valACYclovir (VALTREX) 500 MG tablet Take on tablet by mouth twice a day for 3 days as needed for an outbreak  ? zolpidem (AMBIEN) 5 MG tablet Take 0.5-1 tablets (2.5-5 mg total) by mouth at bedtime as needed for sleep.  ? ?No facility-administered  encounter medications on file as of 05/22/2021.  ? ? ?Patient Active Problem List  ? Diagnosis Date Noted  ? Depression, major, single episode, moderate ([place]) 12/08/2020  ? Gait abnormality 07/31/2020  ? Paresthesia 07/31/2020  ? Low back pain with bilateral sciatica 07/31/2020  ? Pneumonia due to COVID-19 virus   ? Bacteremia due to Gram-positive bacteria 08/29/2018  ? Abscess   ? Cellulitis of left breast 08/28/2018  ? Cellulitis of left abdominal wall 08/28/2018  ? Encounter for routine gynecological examination 04/28/2018  ? Stage 3 chronic kidney disease ([place]) 09/27/2017  ? Recurrent genital herpes 04/13/2017  ? Chronic pain disorder 01/25/2017  ? Malignant neoplasm of upper-outer quadrant of left breast in female, estrogen receptor positive ([place]) 12/16/2016  ? Chronic knee pain 12/07/2016  ? Chronic bilateral low back pain without sciatica 12/07/2016  ? Peripheral musculoskeletal gait disorder 12/07/2016  ? Encounter for therapeutic drug monitoring 06/16/2016  ? Generalized osteoarthritis of multiple sites 05/04/2016  ? Aortic valve disease 04/10/2016  ? S/P AVR 04/10/2016  ? NICM (nonischemic cardiomyopathy) ([place]) 04/10/2016  ? Upper airway cough syndrome 03/14/2016  ? Class 2 severe obesity with serious comorbidity in adult [facility]) 03/14/2016  ? COPD (chronic obstructive pulmonary disease) ([place]) 03/11/2016  ? Thrombocytopenia ([place]) 12/12/2015  ? Anemia 12/12/2015  ? Vitamin D deficiency 12/12/2015  ? Acute urinary retention 12/11/2015  ? Osteoporosis 12/11/2015  ? Hip fracture ([place]) 12/10/2015  ? Aortic atherosclerosis ([place]) 12/10/2015  ? Lung blebs ([place]) 12/03/2015  ? Pelvic fracture ([place]) 08/19/2015  ? Fall at home 08/19/2015  ? Essential hypertension 08/19/2015  ? Hyperlipidemia LDL goal <70 08/19/2015  ? Asthma 08/19/2015  ? Paroxysmal atrial fibrillation ([place]) 08/19/2015  ? Hyperthyroidism 08/19/2015  ? Chronic HFrEF (heart failure with reduced ejection fraction) ([place])   ? Coronary artery disease   ?  Aortic stenosis   ? ? ?Conditions to be addressed/monitored:Atrial Fibrillation, CHF, HTN, HLD, Osteoarthritis, and chronic pain ? ?Care Plan : RN Care Manager Plan of Care  ?Updates made by Dimitri Ped, RN since 05/22/2021 12:00 AM  ?  ? ?Problem: Chronic Disease Management and Care Coordination Needs (CHF, HTN, Atrial Fib,HLD, chronic pain)   ?Priority: High  ?  ? ?Long-Range Goal: Establish Plan of Care for Chronic Disease Management Needs (CHF, HTN, Atrial Fib, HLD,chronic pain)   ?Start Date: 01/15/2021  ?Expected End Date: 05/22/2022  ?Recent Progress: On track  ?Priority: High  ?Note:   ?Current Barriers:  ?Chronic Disease Management support and education needs related to Atrial Fibrillation, CHF, HTN, HLD, Osteoarthritis, and chronic pain ?States she had been taking Ozempic to lose weight but has stopped taking because of the nausea and diarrhea.  States she did lose over 10 lbs.  States she is trying to continue to lose weight by only eating when she is hungryPt reports weighting daily and was 240 today. Denies any chest pains.  Reports she wears compression hose and keeps her legs elevated.  Reports shortness of breath with exertion,  Denies any recent atrial fibrillation.  States she trying to eat more vegetables since she was changed to Eliquis. .  States she is checking her B/P 4-5 times a week and it was 150/70 when she last checked it. Denies any dizziness States she tries to follow a low sodium diet and the fluid restriction that the heart failure clinic gave her.  States she still has has chronic pain in her knees but she had a gel injection in her rt knee and it is feeling much better.  States she is using her Cubbi to exercise her legs for ablut 2000 steps a day. States she has not been walking but she has been doing leg lifts and squats ? ?RNCM Clinical Goal(s):  ?Patient will verbalize understanding of plan for management of Atrial Fibrillation, CHF, HTN, HLD, Osteoarthritis, and chronic  pain as evidenced by voiced adherence to plan of care ?verbalize basic understanding of  Atrial Fibrillation, CHF, HTN, HLD, Osteoarthritis, and chronic pain disease process and self health management plan as ev

## 2021-05-22 NOTE — Telephone Encounter (Signed)
-----   Message from [name], MD sent at 05/22/2021  7:35 AM EDT ----- ?Please let Danielle [name] know that her creatinine has increased slightly compared to a month ago, which could suggest slight intravascular volume depletion.  I think it is reasonable for her to continue to use torsemide as needed for weight gain/edema.  In the setting of her recent diarrhea and other GI issues, she should try to stay adequately hydrated with water.  If her GI issues continue, she should reach out to Drs. [name] and [place] for further evaluation and to discuss discontinuation of Ozempic. ?

## 2021-05-22 NOTE — Patient Instructions (Signed)
Visit Information ? ?Thank you for taking time to visit with me today. Please don't hesitate to contact me if I can be of assistance to you before our next scheduled telephone appointment. ? ?Following are the goals we discussed today:  ?Take all medications as prescribed ?Attend all scheduled provider appointments ?Call pharmacy for medication refills 3-7 days in advance of running out of medications ?Call provider office for new concerns or questions  ?call office if I gain more than 2 pounds in one day or 5 pounds in one week ?keep legs up while sitting ?track weight in diary ?use salt in moderation ?watch for swelling in feet, ankles and legs every day ?follow rescue plan if symptoms flare-up ?check blood pressure daily ?choose a place to take my blood pressure (home, clinic or office, retail store) ?take blood pressure log to all doctor appointments ?take medications for blood pressure exactly as prescribed ?call for medicine refill 2 or 3 days before it runs out ?take all medications exactly as prescribed ?call doctor with any symptoms you believe are related to your medicine ? learn how to meditate ?- learn relaxation techniques ?- practice acceptance of chronic pain ?- practice relaxation or meditation daily ?- tell myself I can (not I can't) ?- think of new ways to do favorite things ?- use distraction techniques ?- use relaxation during pain ? ?Our next appointment is by telephone on 06/22/21 at 3 PM ? ?Please call the care guide team at [phone] if you need to cancel or reschedule your appointment.  ? ?If you are experiencing a Mental Health or [place] or need someone to talk to, please call the Suicide and Crisis Lifeline: 988 ?call the [place] National Suicide Prevention Lifeline: 269-503-6999 or TTY: (575) 051-4476 TTY (608)515-2072) to talk to a trained counselor ?call 1-800-273-TALK (toll free, 24 hour hotline) ?go to [facility] [address]  [place] [phone]) ?call 911  ? ?Patient verbalizes understanding of instructions and care plan provided today and agrees to view in [place]. Active MyChart status confirmed with patient.   ? ?Peter Garter RN, BSN,CCM, CDE ?Care Management Coordinator ?[facility] ?[phone]   ?

## 2021-05-27 NOTE — Telephone Encounter (Signed)
Attempted to call pt x 2. No answer. Lmtcb.  

## 2021-05-29 DIAGNOSIS — I4891 Unspecified atrial fibrillation: Secondary | ICD-10-CM

## 2021-05-29 DIAGNOSIS — E785 Hyperlipidemia, unspecified: Secondary | ICD-10-CM | POA: Diagnosis not present

## 2021-05-29 DIAGNOSIS — I110 Hypertensive heart disease with heart failure: Secondary | ICD-10-CM

## 2021-05-29 DIAGNOSIS — I509 Heart failure, unspecified: Secondary | ICD-10-CM | POA: Diagnosis not present

## 2021-05-29 MED ORDER — TORSEMIDE 20 MG PO TABS: 20.0000 mg | ORAL_TABLET | ORAL | Status: DC | PRN

## 2021-05-29 NOTE — Telephone Encounter (Signed)
Attempted to call both pt and pt's husband.  ?No answer. Lmtcb on both vm.  ?Third failed attempt to reach pt.  ?Message sent to pt's MyChart and letter mailed to pt asking to call our office to discuss results and Dr. Darnelle Bos recc.  ?

## 2021-05-29 NOTE — Addendum Note (Signed)
Addended by: Darlyne Russian on: 05/29/2021 02:26 PM ? ? Modules accepted: Orders ? ?

## 2021-05-29 NOTE — Telephone Encounter (Signed)
Pt returned call. Notified pt of lab results and Dr. Darnelle Bos recc.  ?Pt voiced understanding.  ?Pt will change Torsemide to taking only as needed for weight gain/edema.  ?Pt states that she has already stopped taking Ozempic d/t continued diarrhea.  ?Pt states that her GI issues have improved significantly after stopping Ozempic. ?Pt ensure to stay hydrated with water after recent diarrhea.  ? ?Pt has no further questions at this time.  ?

## 2021-06-22 ENCOUNTER — Ambulatory Visit (INDEPENDENT_AMBULATORY_CARE_PROVIDER_SITE_OTHER): Payer: Medicare HMO

## 2021-06-22 ENCOUNTER — Telehealth: Payer: Self-pay | Admitting: Obstetrics and Gynecology

## 2021-06-22 DIAGNOSIS — G8929 Other chronic pain: Secondary | ICD-10-CM

## 2021-06-22 DIAGNOSIS — E782 Mixed hyperlipidemia: Secondary | ICD-10-CM

## 2021-06-22 DIAGNOSIS — I5022 Chronic systolic (congestive) heart failure: Secondary | ICD-10-CM

## 2021-06-22 DIAGNOSIS — I10 Essential (primary) hypertension: Secondary | ICD-10-CM

## 2021-06-22 DIAGNOSIS — I480 Paroxysmal atrial fibrillation: Secondary | ICD-10-CM

## 2021-06-22 NOTE — Patient Instructions (Signed)
Visit Information ? ?Thank you for taking time to visit with me today. Please don't hesitate to contact me if I can be of assistance to you before our next scheduled telephone appointment. ? ?Following are the goals we discussed today:  ?Take all medications as prescribed ?Attend all scheduled provider appointments ?Call pharmacy for medication refills 3-7 days in advance of running out of medications ?Call provider office for new concerns or questions  ?call office if I gain more than 2 pounds in one day or 5 pounds in one week ?keep legs up while sitting ?track weight in diary ?use salt in moderation ?watch for swelling in feet, ankles and legs every day ?follow rescue plan if symptoms flare-up ?check blood pressure daily ?choose a place to take my blood pressure (home, clinic or office, retail store) ?take blood pressure log to all doctor appointments ?take medications for blood pressure exactly as prescribed ?call for medicine refill 2 or 3 days before it runs out ?take all medications exactly as prescribed ?call doctor with any symptoms you believe are related to your medicine ?learn how to meditate ?- learn relaxation techniques ?- practice acceptance of chronic pain ?- practice relaxation or meditation daily ?- tell myself I can (not I can't) ?- think of new ways to do favorite things ?- use distraction techniques ?- use relaxation during pain ?Our next appointment is by telephone on 08/06/21 at 3 PM ? ?Please call the care guide team at [phone] if you need to cancel or reschedule your appointment.  ? ?If you are experiencing a Mental Health or [place] or need someone to talk to, please call the Suicide and Crisis Lifeline: 988 ?call the [place] National Suicide Prevention Lifeline: 302 512 7452 or TTY: 857 051 5472 TTY 615-385-0113) to talk to a trained counselor ?call 1-800-273-TALK (toll free, 24 hour hotline) ?go to [facility] [address]  [place] [phone]) ?call 911  ? ?Patient verbalizes understanding of instructions and care plan provided today and agrees to view in [place]. Active MyChart status confirmed with patient.   ? ?Peter Garter RN, BSN,CCM, CDE ?Care Management Coordinator ?[facility] ?[phone]   ?

## 2021-06-22 NOTE — Chronic Care Management (AMB) (Signed)
?Chronic Care Management  ? ?CCM RN Visit Note ? ?06/22/2021 ?Name: Danielle [name] MRN: [id number] DOB: 09-May-1945 ? ?Subjective: ?Danielle [name] is a 76 y.o. year old female who is a primary care patient of [name], [name], MD. The care management team was consulted for assistance with disease management and care coordination needs.   ? ?Engaged with patient by telephone for follow up visit in response to provider referral for case management and/or care coordination services.  ? ?Consent to Services:  ?The patient was given information about Chronic Care Management services, agreed to services, and gave verbal consent prior to initiation of services.  Please see initial visit note for detailed documentation.  ? ?Patient agreed to services and verbal consent obtained.  ? ?Assessment: Review of patient past medical history, allergies, medications, health status, including review of consultants reports, laboratory and other test data, was performed as part of comprehensive evaluation and provision of chronic care management services.  ? ?SDOH (Social Determinants of Health) assessments and interventions performed:   ? ?CCM Care Plan ? ?Allergies  ?Allergen Reactions  ? Lisinopril Cough  ? Tetanus Toxoid Adsorbed Swelling  ?  Arm swelling  ? Other Cough  ?  PEANUTS  ? ? ?Outpatient Encounter Medications as of 06/22/2021  ?Medication Sig  ? albuterol (PROVENTIL HFA;VENTOLIN HFA) 108 (90 Base) MCG/ACT inhaler Inhale 2 puffs into the lungs every 6 (six) hours as needed for wheezing or shortness of breath.  ? alendronate (FOSAMAX) 70 MG tablet Take 1 tablet (70 mg total) by mouth once a week. Take with a full glass of water on an empty stomach.  ? apixaban (ELIQUIS) 5 MG TABS tablet Take 1 tablet (5 mg total) by mouth 2 (two) times daily.  ? ascorbic acid (VITAMIN C) 500 MG tablet Take 500 mg by mouth daily.  ? budesonide-formoterol (SYMBICORT) 160-4.5 MCG/ACT inhaler Inhale 2 puffs into the lungs  daily as needed.  ? carvedilol (COREG) 12.5 MG tablet Take 1.5 tablets (18.75 mg total) by mouth 2 (two) times daily with a meal.  ? Cholecalciferol 25 MCG (1000 UT) tablet Take 1,000 Units by mouth daily.  ? dapagliflozin propanediol (FARXIGA) 10 MG TABS tablet Take 1 tablet (10 mg total) by mouth daily before breakfast.  ? fluconazole (DIFLUCAN) 150 MG tablet Take 1 tablet today.  Take second tablet 3 days later.  ? fluconazole (DIFLUCAN) 150 MG tablet Take 1 tablet (150 mg total) by mouth once a week for 24 doses.  ? fluticasone (FLONASE) 50 MCG/ACT nasal spray Place 1 spray into both nostrils daily.  ? methimazole (TAPAZOLE) 5 MG tablet Take 3 tablets (15 mg total) by mouth daily.  ? pantoprazole (PROTONIX) 40 MG tablet Take 1 tablet (40 mg total) by mouth daily.  ? rosuvastatin (CRESTOR) 40 MG tablet Take 1 tablet (40 mg total) by mouth daily.  ? sacubitril-valsartan (ENTRESTO) 49-51 MG Take 1 tablet by mouth 2 (two) times daily.  ? Semaglutide,0.25 or 0.'5MG'$ /DOS, (OZEMPIC, 0.25 OR 0.5 MG/DOSE,) 2 MG/1.5ML SOPN Inject 0.'25mg'$  subcutaneously weekly for 4 weeks, then increase to 0.'5mg'$  weekly for 4 weeks (Patient not taking: Reported on 05/22/2021)  ? senna-docusate (SENOKOT-S) 8.6-50 MG tablet Take 2 tablets by mouth at bedtime.  ? spironolactone (ALDACTONE) 25 MG tablet Take 1 tablet (25 mg total) by mouth daily.  ? terconazole (TERAZOL 7) 0.4 % vaginal cream Place 1 applicator vaginally at bedtime.  ? torsemide (DEMADEX) 20 MG tablet Take 1 tablet (20 mg total) by mouth as needed (weight  gain / swelling).  ? traMADol (ULTRAM) 50 MG tablet TAKE 1 TABLET BY MOUTH THREE TIMES DAILY  ? trolamine salicylate (ASPERCREME) 10 % cream Apply 1 application topically as needed for muscle pain.  ? valACYclovir (VALTREX) 500 MG tablet Take on tablet by mouth twice a day for 3 days as needed for an outbreak  ? zolpidem (AMBIEN) 5 MG tablet Take 0.5-1 tablets (2.5-5 mg total) by mouth at bedtime as needed for sleep.  ? ?No  facility-administered encounter medications on file as of 06/22/2021.  ? ? ?Patient Active Problem List  ? Diagnosis Date Noted  ? Depression, major, single episode, moderate ([name]) 12/08/2020  ? Gait abnormality 07/31/2020  ? Paresthesia 07/31/2020  ? Low back pain with bilateral sciatica 07/31/2020  ? Pneumonia due to COVID-19 virus   ? Bacteremia due to Gram-positive bacteria 08/29/2018  ? Abscess   ? Cellulitis of left breast 08/28/2018  ? Cellulitis of left abdominal wall 08/28/2018  ? Encounter for routine gynecological examination 04/28/2018  ? Stage 3 chronic kidney disease ([name]) 09/27/2017  ? Recurrent genital herpes 04/13/2017  ? Chronic pain disorder 01/25/2017  ? Malignant neoplasm of upper-outer quadrant of left breast in female, estrogen receptor positive ([name]) 12/16/2016  ? Chronic knee pain 12/07/2016  ? Chronic bilateral low back pain without sciatica 12/07/2016  ? Peripheral musculoskeletal gait disorder 12/07/2016  ? Encounter for therapeutic drug monitoring 06/16/2016  ? Generalized osteoarthritis of multiple sites 05/04/2016  ? Aortic valve disease 04/10/2016  ? S/P AVR 04/10/2016  ? NICM (nonischemic cardiomyopathy) ([name]) 04/10/2016  ? Upper airway cough syndrome 03/14/2016  ? Class 2 severe obesity with serious comorbidity in adult [facility]) 03/14/2016  ? COPD (chronic obstructive pulmonary disease) ([name]) 03/11/2016  ? Thrombocytopenia ([name]) 12/12/2015  ? Anemia 12/12/2015  ? Vitamin D deficiency 12/12/2015  ? Acute urinary retention 12/11/2015  ? Osteoporosis 12/11/2015  ? Hip fracture ([name]) 12/10/2015  ? Aortic atherosclerosis ([name]) 12/10/2015  ? Lung blebs ([name]) 12/03/2015  ? Pelvic fracture ([name]) 08/19/2015  ? Fall at home 08/19/2015  ? Essential hypertension 08/19/2015  ? Hyperlipidemia LDL goal <70 08/19/2015  ? Asthma 08/19/2015  ? Paroxysmal atrial fibrillation ([name]) 08/19/2015  ? Hyperthyroidism 08/19/2015  ? Chronic HFrEF (heart failure with reduced ejection fraction) ([name])   ? Coronary  artery disease   ? Aortic stenosis   ? ? ?Conditions to be addressed/monitored:Atrial Fibrillation, CHF, HTN, HLD, and chronic pain ? ?Care Plan : RN Care Manager Plan of Care  ?Updates made by Dimitri Ped, RN since 06/22/2021 12:00 AM  ?  ? ?Problem: Chronic Disease Management and Care Coordination Needs (CHF, HTN, Atrial Fib,HLD, chronic pain)   ?Priority: High  ?  ? ?Long-Range Goal: Establish Plan of Care for Chronic Disease Management Needs (CHF, HTN, Atrial Fib, HLD,chronic pain)   ?Start Date: 01/15/2021  ?Expected End Date: 05/22/2022  ?Recent Progress: On track  ?Priority: High  ?Note:   ?Current Barriers:  ?Chronic Disease Management support and education needs related to Atrial Fibrillation, CHF, HTN, HLD, Osteoarthritis, and chronic pain ?States she has slowly been having less diarrhea and she is starting to feel things are getting back to normal.   States she is trying to continue to lose weight by only eating when she is hungry.Pt reports weighting daily and was 234 today. Denies any chest pains.  Reports she wears compression hose and keeps her legs elevated.  Reports shortness of breath with exertion,  Denies any recent atrial fibrillation. States she  trying to eat more vegetables since she was changed to Eliquis. .  States she is checking her B/P 4-5 times a week and it was 128/83 when she last checked it. Denies any dizziness States she tries to follow a low sodium diet and the fluid restriction that the heart failure clinic gave her.  States she still has has chronic pain in her knees but she had a gel injection in her rt knee and it is feeling much better. States her lt knee is aching more and she is getting injection on 07/28/21 States she is using her Cubbi to exercise her legs  and she did 440 steps yesterday. States she has not been walking but she has been doing leg lifts and squats ? ?RNCM Clinical Goal(s):  ?Patient will verbalize understanding of plan for management of Atrial  Fibrillation, CHF, HTN, HLD, Osteoarthritis, and chronic pain as evidenced by voiced adherence to plan of care ?verbalize basic understanding of  Atrial Fibrillation, CHF, HTN, HLD, Osteoarthritis, and chronic pain d

## 2021-06-22 NOTE — Telephone Encounter (Signed)
Please contact patient to reschedule her ultrasound appointment for recurrent postmenopausal bleeding and a recheck with me. ? ?She cancelled her appointment for March and has not rescheduled to date.  ? ? ?

## 2021-06-23 NOTE — Telephone Encounter (Signed)
Spoke with patient who stated will call back once she checks her schedule. ?

## 2021-06-26 ENCOUNTER — Telehealth: Payer: Self-pay | Admitting: Pharmacist

## 2021-06-26 NOTE — Chronic Care Management (AMB) (Signed)
? ? ?  Chronic Care Management ?Pharmacy Assistant  ? ?Name: Danielle [name]  MRN: [id number] DOB: 06-18-1945 ? ?06/29/2021 APPOINTMENT REMINDER ? ? ?[name], No answer, left message of appointment on 06/29/2021 at 2:00 via telephone visit with [name], Pharm D. Notified to have all medications, supplements, blood pressure and/or blood sugar logs available during appointment and to return call if need to reschedule. ? ?Care Gaps: ?AWV - scheduled for 02/01/2022 ?Last BP - 102/70 on 05/20/2021 ?Last A1C - 5.2 on 05/04/2021 ?Covid-19 vaccine - overdue  ?  ?Star Rating Drugs: ?Farxiga '10mg'$  - patient assistance [employer] ?Rosuvastatin '40mg'$  - last filled on 03/26/2021 90DS at [facility] ? ?Any gaps in medications fill history?  ? ?[name] CMA  ?Clinical Pharmacist Assistant ?[phone] ? ?

## 2021-06-28 DIAGNOSIS — E785 Hyperlipidemia, unspecified: Secondary | ICD-10-CM

## 2021-06-28 DIAGNOSIS — I509 Heart failure, unspecified: Secondary | ICD-10-CM | POA: Diagnosis not present

## 2021-06-28 DIAGNOSIS — I4891 Unspecified atrial fibrillation: Secondary | ICD-10-CM

## 2021-06-28 DIAGNOSIS — I110 Hypertensive heart disease with heart failure: Secondary | ICD-10-CM | POA: Diagnosis not present

## 2021-06-29 ENCOUNTER — Ambulatory Visit (INDEPENDENT_AMBULATORY_CARE_PROVIDER_SITE_OTHER): Payer: Medicare HMO | Admitting: Pharmacist

## 2021-06-29 DIAGNOSIS — M810 Age-related osteoporosis without current pathological fracture: Secondary | ICD-10-CM

## 2021-06-29 DIAGNOSIS — I5022 Chronic systolic (congestive) heart failure: Secondary | ICD-10-CM

## 2021-06-29 DIAGNOSIS — I10 Essential (primary) hypertension: Secondary | ICD-10-CM

## 2021-06-29 NOTE — Progress Notes (Cosign Needed)
? ?Chronic Care Management ?Pharmacy Note ? ?06/29/2021 ?Name:  Danielle [name] MRN:  [id number] DOB:  1945-10-17 ? ?Summary: ?BP at goal < 140/90 per home readings ?Pt is not taking alendronate regularly and DEXA worsened ? ?Recommendations/Changes made from today's visit: ?-Recommend switching alendronate to Prolia due to ease of administration and DEXA worsening ?-Recommended continued BP monitoring at home and daily weights ?-Recommended calcium citrate 500-600 mg supplementation daily ? ? ?Plan: ?Mailed healthy plate handout ?Follow up on potential changes with osteoporosis medication ? ? ?Subjective: ?Danielle [name] is an 76 y.o. year old female who is a primary patient of [name], [name], MD.  The CCM team was consulted for assistance with disease management and care coordination needs.   ? ?Engaged with patient by telephone for follow up visit in response to provider referral for pharmacy case management and/or care coordination services.  ? ?Consent to Services:  ?The patient was given information about Chronic Care Management services, agreed to services, and gave verbal consent prior to initiation of services.  Please see initial visit note for detailed documentation.  ? ?Patient Care Team: ?[name], [name], MD as PCP - General (Family Medicine) ?[name], MD as PCP - Cardiology (Cardiology) ?[name], MD as Consulting Physician (Hematology) ?Kyung Rudd, MD as Consulting Physician (Radiation Oncology) ?Rolm Bookbinder, MD as Consulting Physician (General Surgery) ?Gardenia Phlegm, NP as Nurse Practitioner (Hematology and Oncology) ?Dimitri Ped, RN as Case Manager ?Viona Gilmore, [facility] as Pharmacist (Pharmacist) ?Alla Feeling, NP as Nurse Practitioner (Nurse Practitioner) ? ?Recent office visits: ?06/22/21 Peter Garter, RN: Patient presented for RN CCM visit. ? ?05/04/21 [name], MD: Patient presented for glycosuria. Recommended continue with  Monistat and may need to stop Wilder Glade if recurrent candida persists. ? ?04/13/21 [name], MD: Patient presented for GI symptoms and chronic conditions follow up. Prescribed Ambien 2.5 mg as needed for sleep. ? ?Recent consult visits: ?05/20/21 [name], MD (cardiology): Patient presented for CHF follow up. D/c'd Ozempic due to diarrhea. ? ?04/30/21 [name], MD (physical medicine & rehab): Patient presented for OA follow up and procedure. Administered gel injection in knee. Follow up in 3 months. ? ?04/28/21 [name] [phone]): Patient presented for vaginitis and urgent care follow up. ? ?03/17/2021 [name] MD (physical medicine and rehabilitation) - Patient was seen for Encounter for long-term use of opiate analgesic and additional issues. No follow up noted.  ? ?03/13/21 Allena Katz, [place] (cardiology): Patient presented for HF follow up. Restarted Entresto 49/51 mg BID. Changed carvedilol to 18.75 mg BID. ? ?12/17/20 [name], MD (cardiology): Patient presented for CHF visit. Increased spironolactone to 25 mg daily and increased torsemide to 40 mg daily alternating with 20 mg every other day. Stopped warfarin and started Eliquis 5 mg BID. ? ?11/21/20 [name], MD (oncology): Patient presented for breast cancer follow up. ? ?Hospital visits: ?04/18/21 Patient presented to [facility] for vaginal discharge. Prescribed fluconazole x 24 doses. ? ?Objective: ? ?Lab Results  ?Component Value Date  ? CREATININE 1.83 (H) 05/20/2021  ? BUN 22 05/20/2021  ? GFR 33.40 (L) 04/13/2021  ? GFRNONAA 28 (L) 05/20/2021  ? GFRAA 57 (L) 02/27/2020  ? NA 137 05/20/2021  ? K 4.6 05/20/2021  ? CALCIUM 10.2 05/20/2021  ? CO2 23 05/20/2021  ? GLUCOSE 99 05/20/2021  ? ? ?Lab Results  ?Component Value Date/Time  ? HGBA1C 5.2 05/04/2021 11:56 AM  ? HGBA1C 5.1 12/08/2020 09:27 AM  ?  HGBA1C 5.3 07/31/2020 10:48 AM  ? HGBA1C 5.0 08/28/2018 05:25 PM  ? GFR 33.40 (L) 04/13/2021  10:04 AM  ? GFR 57.35 (L) 01/04/2018 12:32 PM  ? MICROALBUR 1.0 07/07/2020 09:41 AM  ? MICROALBUR 1.9 01/04/2018 12:32 PM  ?  ?Last diabetic Eye exam: No results found for: HMDIABEYEEXA  ?Last diabetic Foot exam: No results found for: HMDIABFOOTEX  ? ?Lab Results  ?Component Value Date  ? CHOL 142 02/10/2021  ? HDL 63 02/10/2021  ? [place] 67 02/10/2021  ? TRIG 59 02/10/2021  ? CHOLHDL 2.3 02/10/2021  ? ? ? ?  Latest Ref Rng & Units 11/21/2020  ?  9:34 AM 10/29/2020  ? 10:10 AM 07/24/2020  ? 10:58 AM  ?Hepatic Function  ?Total Protein 6.5 - 8.1 g/dL 6.6   7.3   6.7    ?Albumin 3.5 - 5.0 g/dL 3.4   3.8   3.3    ?AST 15 - 41 U/L '11   13   9    ' ?ALT 0 - 44 U/L 14   11   <6    ?Alk Phosphatase 38 - 126 U/L 46   50   47    ?Total Bilirubin 0.3 - 1.2 mg/dL 0.4   0.4   0.3    ? ? ?Lab Results  ?Component Value Date/Time  ? TSH 0.63 04/13/2021 10:04 AM  ? TSH 1.146 12/17/2020 10:40 AM  ? TSH 0.53 12/08/2020 09:26 AM  ? FREET4 0.91 01/29/2019 12:32 PM  ? FREET4 0.80 06/14/2017 03:24 PM  ? ? ? ?  Latest Ref Rng & Units 11/21/2020  ?  9:34 AM 11/17/2020  ?  6:04 AM 11/17/2020  ?  5:58 AM  ?CBC  ?WBC 4.0 - 10.5 K/uL 6.3    5.4    ?Hemoglobin 12.0 - 15.0 g/dL 10.9   12.9   12.1    ?Hematocrit 36.0 - 46.0 % 33.6   38.0   37.9    ?Platelets 150 - 400 K/uL 70    84    ? ? ?Lab Results  ?Component Value Date/Time  ? VD25OH 39.35 07/02/2020 10:29 AM  ? VD25OH 39.12 01/29/2019 12:32 PM  ? ? ?Clinical ASCVD: Yes  ?The ASCVD Risk score (Arnett DK, et al., 2019) failed to calculate for the following reasons: ?  The patient has a prior MI or stroke diagnosis   ? ? ?  05/04/2021  ? 11:25 AM 04/30/2021  ? 10:01 AM 04/13/2021  ?  9:17 AM  ?Depression screen PHQ 2/9  ?Decreased Interest 2 0 0  ?Down, Depressed, Hopeless 1 0 0  ?PHQ - 2 Score 3 0 0  ?Altered sleeping 3  3  ?Tired, decreased energy 1  2  ?Change in appetite 0  0  ?Feeling bad or failure about yourself  0  2  ?Trouble concentrating 0  1  ?Moving slowly or fidgety/restless 0  3  ?Suicidal  thoughts 0  0  ?PHQ-9 Score 7  11  ?Difficult doing work/chores Somewhat difficult  Somewhat difficult  ?  ? ?CHA2DS2/VAS Stroke Risk Points  Current as of 9 minutes ago  ?   7 >= 2 Points: High Risk  ?1 - 1.99 Points: Medium Risk  ?0 Points: Low Risk  ?  Last Change: N/A   ?  ? Details   ? This score determines the patient's risk of having a stroke if the  ?patient has atrial fibrillation.  ?  ?  ? Points  Metrics  ?1 Has Congestive Heart Failure:  Yes   ? Current as of 9 minutes ago  ?1 Has Vascular Disease:  Yes   ? Current as of 9 minutes ago  ?1 Has Hypertension:  Yes   ? Current as of 9 minutes ago  ?1 Age:  73   ? Current as of 9 minutes ago  ?0 Has Diabetes:  No   ? Current as of 9 minutes ago  ?2 Had Stroke:  Yes  Had TIA:  No  Had Thromboembolism:  No   ? Current as of 9 minutes ago  ?1 Female:  Yes   ? Current as of 9 minutes ago  ?  ? ? ?Social History  ? ?Tobacco Use  ?Smoking Status Former  ? Packs/day: 0.25  ? Years: 10.00  ? Pack years: 2.50  ? Types: Cigarettes  ? Quit date: 2012  ? Years since quitting: 11.3  ?Smokeless Tobacco Never  ? ?BP Readings from Last 3 Encounters:  ?05/20/21 102/70  ?05/04/21 130/80  ?04/30/21 138/77  ? ?Pulse Readings from Last 3 Encounters:  ?05/20/21 96  ?05/04/21 85  ?04/30/21 89  ? ?Wt Readings from Last 3 Encounters:  ?05/20/21 239 lb (108.4 kg)  ?05/04/21 241 lb (109.3 kg)  ?04/30/21 241 lb (109.3 kg)  ? ?BMI Readings from Last 3 Encounters:  ?05/20/21 37.43 kg/m?  ?05/04/21 37.75 kg/m?  ?04/30/21 37.75 kg/m?  ? ? ?Assessment/Interventions: Review of patient past medical history, allergies, medications, health status, including review of consultants reports, laboratory and other test data, was performed as part of comprehensive evaluation and provision of chronic care management services.  ? ?SDOH:  (Social Determinants of Health) assessments and interventions performed: No ? ?SDOH Screenings  ? ?Alcohol Screen: Not on file  ?Depression (PHQ2-9): Medium Risk  ? PHQ-2  Score: 7  ?Financial Resource Strain: Low Risk   ? Difficulty of Paying Living Expenses: Not very hard  ?Food Insecurity: No Food Insecurity  ? Worried About Charity fundraiser in the Last Year: Never true

## 2021-06-29 NOTE — Patient Instructions (Signed)
Hi Danielle [name], ? ?It was great to catch up again! Don't forget to start taking calcium citrate 500-600 mg daily and separate from your multivitamin. ? ?Please reach out to me if you have any questions or need anything! ? ?Best, ?Maddie ? ?[name], PharmD, BCACP ?Clinical Pharmacist ?Therapist, music at [place] ?[phone] ? ? Visit Information ? ? Goals Addressed   ?None ?  ? ? ? ?Patient Care Plan: [place]  ?  ? ?Problem Identified: Problem: Hypertension, Hyperlipidemia, Atrial Fibrillation, Heart Failure, COPD, Asthma, Chronic Kidney Disease, Osteoporosis and Breast cancer, chronic pain, insomnia, and hyperthyroidism   ?  ? ?Long-Range Goal: Patient-Specific Goal   ?Start Date: 06/11/2020  ?Expected End Date: 06/11/2021  ?Recent Progress: On track  ?Priority: High  ?Note:   ?Current Barriers:  ?Unable to independently afford treatment regimen ?Unable to independently monitor therapeutic efficacy ?Unable to self administer medications as prescribed ? ?Pharmacist Clinical Goal(s):  ?Patient will verbalize ability to afford treatment regimen ?achieve adherence to monitoring guidelines and medication adherence to achieve therapeutic efficacy through collaboration with PharmD and provider.  ? ?Interventions: ?1:1 collaboration with [name], [name], MD regarding development and update of comprehensive plan of care as evidenced by provider attestation and co-signature ?Inter-disciplinary care team collaboration (see longitudinal plan of care) ?Comprehensive medication review performed; medication list updated in electronic medical record ? ?Hypertension (BP goal <140/90) ?-Controlled ?-Current treatment: ?Carvedilol 12.5 mg, 1.5 tablet twice daily - Appropriate, Effective, Safe, Accessible ?Entresto 49-51 mg 1 tablet twice daily - Appropriate, Effective, Safe, Accessible ?Spironolactone 25 mg 1 tablet daily - Appropriate, Effective, Safe, Accessible ?-Medications previously tried: none   ?-Current home readings: 128/90 (checking every other day); nothing > 140 ?-Current dietary habits: limits salt intake ?-Current exercise habits: limited in ability to exercise ?-Denies hypotensive/hypertensive symptoms ?-Educated on Importance of home blood pressure monitoring; ?Proper BP monitoring technique; ?Symptoms of hypotension and importance of maintaining adequate hydration; ?-Counseled to monitor BP at home weekly, document, and provide log at future appointments ?-Counseled on diet and exercise extensively ?Recommended to continue current medication ? ?Hyperlipidemia/history of stroke: (LDL goal < 70) ?-Controlled ?-Current treatment: ?Rosuvastatin '40mg'$ , 1 tablet once daily - Appropriate, Effective, Safe, Accessible ?-Medications previously tried: none  ?-Current dietary patterns: sometimes fries food, no processed meats, no read meat and uses canolia oil ?-Current exercise habits: limited in ability ?-Educated on Cholesterol goals;  ?Benefits of statin for ASCVD risk reduction; ?Importance of limiting foods high in cholesterol; ?Exercise goal of 150 minutes per week; ?-Counseled on diet and exercise extensively ?Recommended to continue current medication ? ?Heart Failure (Goal: manage symptoms and prevent exacerbations) ?-Uncontrolled ?-Last ejection fraction: 45-50% (Date: 11/21/19) ?-HF type: Systolic ?-NYHA Class: III (marked limitation of activity) ?-AHA HF Stage: C (Heart disease and symptoms present) ?-Current treatment: ?Entresto 49-51 mg 1 tablet twice daily - Appropriate, Effective, Safe, Accessible ?Torsemide 20 mg 1 tablet daily as needed - Appropriate, Effective, Safe, Accessible ?Carvedilol 12.5 mg, 1.5 tablets twice daily - Appropriate, Effective, Safe, Accessible ?Farxiga 10 mg, 1 tablet daily - Appropriate, Effective, Safe, Accessible ?-Medications previously tried: losartan  ?-Current home BP/HR readings: never > 140 ?-Current dietary habits: limits salt intake; drinks only 2 bottles of  water and a cup of coffee daily ?-Current exercise habits: limited in ability; [facility] doing PT [facility] ?-Educated on Importance of weighing daily; if you gain more than 3 pounds in one day or 5 pounds  in one week, call cardiologist ?Proper diuretic administration and potassium supplementation ?Importance of blood pressure control ?-Recommended to continue current medication ?Assessed patient finances. Patient is receiving Entresto through patient assistance. ? ?Atrial Fibrillation (Goal: prevent stroke and major bleeding) ?-Controlled ?-CHADSVASC: 7 ?-Current treatment: ?Rate control: carvedilol 12.5 mg 1.5 tablets twice daily - Appropriate, Effective, Safe, Accessible ?Anticoagulation: Eliquis 5 mg 1 tablet twice daily - Appropriate, Effective, Safe, Accessible ?-Medications previously tried: warfarin ?-Home BP and HR readings: nothing > 140  ?-Counseled on increased risk of stroke due to Afib and benefits of anticoagulation for stroke prevention; ?importance of adherence to anticoagulant exactly as prescribed; ?bleeding risk associated with Eliquis and importance of self-monitoring for signs/symptoms of bleeding; ?avoidance of NSAIDs due to increased bleeding risk with anticoagulants; ?-Recommended to continue current medication ?Educated on importance of taking Eliquis doses approximately 12 hours apart. ? ?COPD/asthma (Goal: control symptoms and prevent exacerbations) ?-Uncontrolled ?-Current treatment  ?Albuterol HFA inhaler, 2 puffs every six hours as needed for whezing or shortness of breath - Appropriate, Effective, Safe, Accessible ?Symbicort 160-4.55 mcg/act inhale 2 puffs twice daily - does not use or have - Appropriate, Effective, Safe, Accessible ?-Medications previously tried: Dulera (switched to Symbicort) ?-Gold Grade: Gold 1 (FEV1>80%) ?-Current COPD Classification:  B (high sx, <2 exacerbations/yr) ?-MMRC/CAT score: n/a ?-Pulmonary function  testing: n/a ?-Exacerbations requiring treatment in last 6 months: none ?-Patient denies consistent use of maintenance inhaler (only using once daily) ?-Frequency of rescue inhaler use: regularly ?-Counseled on Proper inhaler technique; ?Benefits of consistent maintenance inhaler use ?When to use rescue inhaler ?Differences between maintenance and rescue inhalers ?-Recommended to continue current medication ?Assessed patient finances. Qualified for Symbicort patient assistance.  ? ?Osteoporosis (Goal prevent fractures) ?-Controlled ?-Last DEXA Scan: 12/2020  ? T-Score femoral neck: -3.3 ? T-Score total hip: n/a ? T-Score lumbar spine: n/a ? T-Score forearm radius: -1.9 ? 10-year probability of major osteoporotic fracture: n/a ? 10-year probability of hip fracture: n/a ?-Patient is a candidate for pharmacologic treatment due to history of hip fracture  and T-Score < -2.5 in femoral neck ?-Current treatment  ?Alendronate '70mg'$ , 1 tablet once a week. (Saturday- started 2017) - Appropriate, Query effective, Query Safe, Accessible ?Vitamin D 1000 units, 1 tablet once daily - Appropriate, Effective, Safe, Accessible ?Multivitamin 1 tablet daily (calcium 500 mg) - Appropriate, Effective, Safe, Accessible ?-Medications previously tried: none  ?-Recommend [phone] units of vitamin D daily. Recommend 1200 mg of calcium daily from dietary and supplemental sources. Counseled on oral bisphosphonate administration: take in the morning, 30 minutes prior to food with 6-8 oz of water. Do not lie down for at least 30 minutes after taking. Recommend weight-bearing and muscle strengthening exercises for building and maintaining bone density. ?-Recommended to continue current medication ?Recommended supplementing with 500-600 mg of calcium citrate daily. ?Educated on drug holiday and consider Prolia for worsening bone density. ? ?Chronic pain (Goal: minimize symptoms) ?-Controlled ?-Current treatment  ?Diclofenac 1%gel, apply 4g four  times daily  - Appropriate, Effective, Safe, Accessible ?Aspercreme- as needed  - Appropriate, Effective, Safe, Accessible ?APAP '500mg'$ , 2 tablet daily as needed for pain - Appropriate, Effective, Safe, Accessible ?Tra

## 2021-07-06 ENCOUNTER — Other Ambulatory Visit: Payer: Self-pay | Admitting: Physical Medicine & Rehabilitation

## 2021-07-06 DIAGNOSIS — M159 Polyosteoarthritis, unspecified: Secondary | ICD-10-CM

## 2021-07-06 DIAGNOSIS — G894 Chronic pain syndrome: Secondary | ICD-10-CM

## 2021-07-08 ENCOUNTER — Telehealth: Payer: Self-pay | Admitting: Family Medicine

## 2021-07-08 MED ORDER — FLUCONAZOLE 150 MG PO TABS: 150.0000 mg | ORAL_TABLET | Freq: Once | ORAL | 0 refills | Status: AC

## 2021-07-08 NOTE — Telephone Encounter (Signed)
I left patient a voicemail letting her know Rx was sent in & to follow up with cardiology about changing medication due to side effects. ?

## 2021-07-08 NOTE — Telephone Encounter (Signed)
Diflucan 150 mg to take once can be sent. She needs to discuss side effects of Farxiga with prescriber, may need to be discontinued. ?Thanks, ?BJ ?

## 2021-07-08 NOTE — Telephone Encounter (Signed)
Pt requesting a refill of fluconazole (DIFLUCAN) 150 MG tablet  since she now had a yeast infection due to her taking her dapagliflozin propanediol (FARXIGA) 10 MG TABS tablet  Danielle [name] ([place]),  - [address] ?

## 2021-07-14 ENCOUNTER — Telehealth: Payer: Self-pay | Admitting: Obstetrics and Gynecology

## 2021-07-14 ENCOUNTER — Ambulatory Visit (HOSPITAL_COMMUNITY)
Admission: RE | Admit: 2021-07-14 | Discharge: 2021-07-14 | Disposition: A | Discharge status: Home / Self Care | Payer: Medicare HMO | Source: Ambulatory Visit | Attending: Cardiology | Admitting: Cardiology

## 2021-07-14 ENCOUNTER — Encounter (HOSPITAL_COMMUNITY): Payer: Self-pay | Admitting: Cardiology

## 2021-07-14 VITALS — BP 124/80 | HR 77 | Wt 234.0 lb

## 2021-07-14 DIAGNOSIS — Z7902 Long term (current) use of antithrombotics/antiplatelets: Secondary | ICD-10-CM | POA: Insufficient documentation

## 2021-07-14 DIAGNOSIS — I428 Other cardiomyopathies: Secondary | ICD-10-CM | POA: Diagnosis not present

## 2021-07-14 DIAGNOSIS — Z7901 Long term (current) use of anticoagulants: Secondary | ICD-10-CM | POA: Insufficient documentation

## 2021-07-14 DIAGNOSIS — M25569 Pain in unspecified knee: Secondary | ICD-10-CM | POA: Diagnosis not present

## 2021-07-14 DIAGNOSIS — Z79899 Other long term (current) drug therapy: Secondary | ICD-10-CM | POA: Insufficient documentation

## 2021-07-14 DIAGNOSIS — I480 Paroxysmal atrial fibrillation: Secondary | ICD-10-CM | POA: Diagnosis not present

## 2021-07-14 DIAGNOSIS — I110 Hypertensive heart disease with heart failure: Secondary | ICD-10-CM | POA: Insufficient documentation

## 2021-07-14 DIAGNOSIS — Z8673 Personal history of transient ischemic attack (TIA), and cerebral infarction without residual deficits: Secondary | ICD-10-CM | POA: Diagnosis not present

## 2021-07-14 DIAGNOSIS — N950 Postmenopausal bleeding: Secondary | ICD-10-CM

## 2021-07-14 DIAGNOSIS — Z7984 Long term (current) use of oral hypoglycemic drugs: Secondary | ICD-10-CM | POA: Diagnosis not present

## 2021-07-14 DIAGNOSIS — E0590 Thyrotoxicosis, unspecified without thyrotoxic crisis or storm: Secondary | ICD-10-CM | POA: Insufficient documentation

## 2021-07-14 DIAGNOSIS — E669 Obesity, unspecified: Secondary | ICD-10-CM | POA: Diagnosis not present

## 2021-07-14 DIAGNOSIS — Z6836 Body mass index (BMI) 36.0-36.9, adult: Secondary | ICD-10-CM | POA: Insufficient documentation

## 2021-07-14 DIAGNOSIS — I5022 Chronic systolic (congestive) heart failure: Secondary | ICD-10-CM | POA: Diagnosis not present

## 2021-07-14 DIAGNOSIS — I351 Nonrheumatic aortic (valve) insufficiency: Secondary | ICD-10-CM | POA: Insufficient documentation

## 2021-07-14 DIAGNOSIS — Z954 Presence of other heart-valve replacement: Secondary | ICD-10-CM | POA: Diagnosis not present

## 2021-07-14 DIAGNOSIS — I252 Old myocardial infarction: Secondary | ICD-10-CM | POA: Diagnosis not present

## 2021-07-14 LAB — BASIC METABOLIC PANEL
Anion gap: 5 (ref 5–15)
BUN: 14 mg/dL (ref 8–23)
CO2: 23 mmol/L (ref 22–32)
Calcium: 10.3 mg/dL (ref 8.9–10.3)
Chloride: 112 mmol/L — ABNORMAL HIGH (ref 98–111)
Creatinine, Ser: 1.57 mg/dL — ABNORMAL HIGH (ref 0.44–1.00)
GFR, Estimated: 34 mL/min — ABNORMAL LOW (ref >60)
Glucose, Bld: 109 mg/dL — ABNORMAL HIGH (ref 70–99)
Potassium: 4.5 mmol/L (ref 3.5–5.1)
Sodium: 140 mmol/L (ref 135–145)

## 2021-07-14 MED ORDER — CARVEDILOL 25 MG PO TABS: 25.0000 mg | ORAL_TABLET | Freq: Two times a day (BID) | ORAL | 3 refills | Status: DC

## 2021-07-14 NOTE — Telephone Encounter (Signed)
Please contact patient to reschedule her pelvic ultrasound for recurrent postmenopausal bleeding.  ? ?She cancelled her ultrasound appointment in March.  ? ?If no response in 1 month, please send a 30 day letter.  ?

## 2021-07-14 NOTE — Patient Instructions (Signed)
INCREASE Coreg to 25 mg one tab twice a day ? ?Labs today ?We will only contact you if something comes back abnormal or we need to make some changes. ?Otherwise no news is good news! ? ? ?Your physician recommends that you schedule a follow-up appointment in: 3 months  in the Advanced Practitioners (PA/NP) Clinic  ? ? ?Do the following things EVERYDAY: ?Weigh yourself in the morning before breakfast. Write it down and keep it in a log. ?Take your medicines as prescribed ?Eat low salt foods--Limit salt (sodium) to 2000 mg per day.  ?Stay as active as you can everyday ?Limit all fluids for the day to less than 2 liters ? ?At the [facility], you and your health needs are our priority. As part of our continuing mission to provide you with exceptional heart care, we have created designated Provider Care Teams. These Care Teams include your primary Cardiologist (physician) and Advanced Practice Providers (APPs- Physician Assistants and Nurse Practitioners) who all work together to provide you with the care you need, when you need it.  ? ?You may see any of the following providers on your designated Care Team at your next follow up: ?Dr Glori Bickers ?Dr [name] ?Darrick Grinder, NP ?Lyda Jester, PA ?Jessica Milford,NP ?Marlyce Huge, PA ?Audry Riles, PharmD ? ? ?Please be sure to bring in all your medications bottles to every appointment.  ? ?If you have any questions or concerns before your next appointment please send [place] a message through [place] or call our office at [phone].   ? ?TO LEAVE A MESSAGE FOR THE NURSE SELECT OPTION 2, PLEASE LEAVE A MESSAGE INCLUDING: ?YOUR NAME ?DATE OF BIRTH ?CALL BACK NUMBER ?REASON FOR CALL**this is important as we prioritize the call backs ? ?YOU WILL RECEIVE A CALL BACK THE SAME DAY AS LONG AS YOU CALL BEFORE 4:00 PM ? ? ? ? ?

## 2021-07-14 NOTE — Progress Notes (Signed)
Medication Samples have been provided to the patient. ? ?Drug name: eliquis       Strength: '5mg'$         Qty: 56  LOT: [id number]   Exp.Date: 04/2023 ? ?Dosing instructions: one tab twice a day ? ?The patient has been instructed regarding the correct time, dose, and frequency of taking this medication, including desired effects and most common side effects.  ? ?Kerry Dory ?9:38 AM ?07/14/2021 ? ?

## 2021-07-15 NOTE — Telephone Encounter (Signed)
Left message for patient to call.

## 2021-07-15 NOTE — Progress Notes (Signed)
PCP: [name], [name], MD ?Cardiology: Dr. Saunders Revel ?HF cardiology: Dr. Aundra Dubin ? ?76 y.o. with history of aortic insufficiency s/p bioprosthetic aortic valve and CHF was referred by Dr End for evaluation of CHF.  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  Since that time, she has had mild to moderately reduced LV systolic function, as low as 30-35% and as high at 45-50%.  Most recent echo in 9/21 showed EF 45-50%.  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  She has had paroxysmal atrial fibrillation and 2 prior CVAs.  She is on warfarin.  She has had breast cancer treated with lumpectomy and radiation, no chemotherapy.  She has history of hyperthyroidism and is on methimazole.  She has controlled HTN.   ? ?Cardiac MRI (10/22) with LV EF 41%, normal RV, subendocardial LGE in the mid-apical inferior wall and the mid-apical inferolateral wall.  ? ?Echo in 12/22 was a very technically difficult study with poor images, EF around 35%, RV poorly visualized, the bioprosthetic aortic valve looked ok.   ? ?She returns for followup of CHF.  Weight is down 17 lbs.  She had diarrhea for 4 wks which resolved when she stopped Ozempic. She had multiple yeast infections and is now off [place].  She walks with a walker due to knee pain.  She takes torsemide 2-3 times/month.  No dyspnea walking with her walker in the house.  She does get short of breath walking outside the house.  2 pillow orthopnea chronically.  No chest pain.  ? ?Labs (5/22): K 4.9, creatinine 1.21 ?Labs (9/22): K 4.9, creatinine 1.4 ?Labs (10/22): K 3.4, creatinine 1.8, TSH normal ?Labs (8/23): K 4.6, creatinine 1.83 ? ?ECG (personally reviewed): NSR, 1st degree AVB, inferior Qs ? ?PMH: ?1. Left breast cancer: Treated with lumpectomy and radiation, no chemotherapy.  ?2. Hyperthyroidism ?3. Aortic insufficiency: Severe, found when she lived in [place].  She had bioprosthetic  aortic valve replacement at [facility] in 1/16.  ?4. Atrial fibrillation: Paroxysmal. ?5. CVA x 2 ?6. HTN ?7. Hyperlipidemia ?8. Chronic systolic CHF: Nonischemic cardiomyopathy.  ?- Echo (3/16): EF 45-50% ?- Echo (1/17): EF 30-35% ?- Echo (2/18): EF 35-40% ?- Echo (11/20): EF 35-40% ?- Echo (9/21): EF 45-50%, global hypokinesis, mild LVH, bioprosthetic aortic valve mean gradient 10 mmHg, normal RV ?- RHC/LHC (1/22): mean RA 9, PA 35/20, mean PCWP 21, CI 3.2.  Nonobstructive CAD.  ?- cMRI (10/22): LV EF 41%, normal RV, subendocardial LGE in the mid-apical inferior wall and the mid-apical inferolateral wall. ?- Echo (12/22): Very technically difficult study with poor images, EF around 35%, RV poorly visualized, the bioprosthetic aortic valve looked ok. ?9. H/o spontaneous PTX ? ?Social History  ? ?Socioeconomic History  ? Marital status: Married  ?  Spouse name: [name]  ? Number of children: 0  ? Years of education: 40  ? Highest education level: Bachelor's degree (e.g., BA, AB, BS)  ?Occupational History  ? Occupation: Retired in 2004  ?Tobacco Use  ? Smoking status: Former  ?  Packs/day: 0.25  ?  Years: 10.00  ?  Pack years: 2.50  ?  Types: Cigarettes  ?  Quit date: 2012  ?  Years since quitting: 11.3  ? Smokeless tobacco: Never  ?Vaping Use  ? Vaping Use: Never used  ?Substance and Sexual Activity  ? Alcohol use: No  ? Drug use: Never  ? Sexual activity: Yes  ?  Birth  control/protection: Post-menopausal  ?Other Topics Concern  ? Not on file  ?Social History Narrative  ? Lives with husband.  Ambulated independently.  ? Right-handed.  ? No daily caffeine use.  ? ?Social Determinants of Health  ? ?Financial Resource Strain: Low Risk   ? Difficulty of Paying Living Expenses: Not very hard  ?Food Insecurity: No Food Insecurity  ? Worried About Charity fundraiser in the Last Year: Never true  ? Ran Out of Food in the Last Year: Never true  ?Transportation Needs: No Transportation Needs  ? Lack of  Transportation (Medical): No  ? Lack of Transportation (Non-Medical): No  ?Physical Activity: Insufficiently Active  ? Days of Exercise per Week: 1 day  ? Minutes of Exercise per Session: 10 min  ?Stress: No Stress Concern Present  ? Feeling of Stress : Not at all  ?Social Connections: Moderately Integrated  ? Frequency of Communication with Friends and Family: More than three times a week  ? Frequency of Social Gatherings with Friends and Family: Once a week  ? Attends Religious Services: More than 4 times per year  ? Active Member of Clubs or Organizations: No  ? Attends Archivist Meetings: Not on file  ? Marital Status: Married  ?Intimate Partner Violence: Not on file  ? ?Family History  ?Problem Relation Age of Onset  ? Diabetes Mother   ? Heart attack Mother 63  ? Diabetes Father   ? Lung cancer Father   ? Diabetes Sister   ? Thyroid disease Sister   ? Diabetes Sister   ? HIV Brother   ? Colon polyps Neg Hx   ? Colon cancer Neg Hx   ? ?ROS: All systems reviewed and negative except as per HPI.  ? ?Current Outpatient Medications  ?Medication Sig Dispense Refill  ? albuterol (PROVENTIL HFA;VENTOLIN HFA) 108 (90 Base) MCG/ACT inhaler Inhale 2 puffs into the lungs every 6 (six) hours as needed for wheezing or shortness of breath.    ? alendronate (FOSAMAX) 70 MG tablet Take 1 tablet (70 mg total) by mouth once a week. Take with a full glass of water on an empty stomach. 13 tablet 3  ? apixaban (ELIQUIS) 5 MG TABS tablet Take 1 tablet (5 mg total) by mouth 2 (two) times daily. 180 tablet 3  ? ascorbic acid (VITAMIN C) 500 MG tablet Take 500 mg by mouth daily.    ? budesonide-formoterol (SYMBICORT) 160-4.5 MCG/ACT inhaler Inhale 2 puffs into the lungs daily as needed. 3 each 3  ? Cholecalciferol 25 MCG (1000 UT) tablet Take 1,000 Units by mouth daily.    ? fluticasone (FLONASE) 50 MCG/ACT nasal spray Place 1 spray into both nostrils daily. 16 g 3  ? methimazole (TAPAZOLE) 5 MG tablet Take 3 tablets (15 mg  total) by mouth daily. 270 tablet 3  ? Multiple Vitamin (MULTIVITAMIN) tablet Take 1 tablet by mouth daily.    ? pantoprazole (PROTONIX) 40 MG tablet Take 1 tablet (40 mg total) by mouth daily. 30 tablet 11  ? rosuvastatin (CRESTOR) 40 MG tablet Take 1 tablet (40 mg total) by mouth daily. 90 tablet 3  ? sacubitril-valsartan (ENTRESTO) 49-51 MG Take 1 tablet by mouth 2 (two) times daily. 60 tablet 4  ? senna-docusate (SENOKOT-S) 8.6-50 MG tablet Take 2 tablets by mouth at bedtime.    ? spironolactone (ALDACTONE) 25 MG tablet Take 1 tablet (25 mg total) by mouth daily. 90 tablet 3  ? torsemide (DEMADEX) 20 MG tablet Take  1 tablet (20 mg total) by mouth as needed (weight gain / swelling).    ? traMADol (ULTRAM) 50 MG tablet TAKE 1 TABLET BY MOUTH THREE TIMES DAILY 90 tablet 0  ? trolamine salicylate (ASPERCREME) 10 % cream Apply 1 application topically as needed for muscle pain.    ? valACYclovir (VALTREX) 500 MG tablet Take on tablet by mouth twice a day for 3 days as needed for an outbreak 30 tablet 0  ? zolpidem (AMBIEN) 5 MG tablet Take 0.5-1 tablets (2.5-5 mg total) by mouth at bedtime as needed for sleep. 15 tablet 0  ? carvedilol (COREG) 25 MG tablet Take 1 tablet (25 mg total) by mouth 2 (two) times daily with a meal. 180 tablet 3  ? ?No current facility-administered medications for this encounter.  ? ?BP 124/80   Pulse 77   Wt 106.1 kg (234 lb)   SpO2 99%   BMI 36.65 kg/m?  ?General: NAD ?Neck: No JVD, no thyromegaly or thyroid nodule.  ?Lungs: Clear to auscultation bilaterally with normal respiratory effort. ?CV: Nondisplaced PMI.  Heart regular S1/S2, no S3/S4, no murmur.  Trace ankle edema.  No carotid bruit.  Normal pedal pulses.  ?Abdomen: Soft, nontender, no hepatosplenomegaly, no distention.  ?Skin: Intact without lesions or rashes.  ?Neurologic: Alert and oriented x 3.  ?Psych: Normal affect. ?Extremities: No clubbing or cyanosis.  ?HEENT: Normal.  ? ?Assessment/Plan: ?1. Chronic HF with mid range  EF: Nonischemic cardiomyopathy.  Echo in 9/21 showed EF 45-50%, global hypokinesis, mild LVH, bioprosthetic aortic valve mean gradient 10 mmHg, normal RV.  Cause of cardiomyopathy is uncertain, mild CAD on 1/22 cath.  It is

## 2021-07-20 NOTE — Telephone Encounter (Signed)
Spoke with patient and she cancelled appointment because had diarrhea from starting [place]. Patient her husband has been diagnosed with Parkinson disease and he can't drive anymore( he drove her to appointments) patient said she would like to have ultrasound done,but transportation is the issue right now.

## 2021-07-22 NOTE — Telephone Encounter (Signed)
Please contact patient back regarding the need for evaluation of her recurrent postmenopausal bleeding.   She may have a pelvic ultrasound done at [place].   This is an important evaluation to rule out cancer.   She will need an office follow up appointment with me. She may need an endometrial biopsy at that time.

## 2021-07-23 NOTE — Telephone Encounter (Signed)
LVMTCB

## 2021-07-28 ENCOUNTER — Encounter: Payer: Self-pay | Admitting: Physical Medicine & Rehabilitation

## 2021-07-28 ENCOUNTER — Encounter: Payer: Medicare HMO | Attending: Physical Medicine & Rehabilitation | Admitting: Physical Medicine & Rehabilitation

## 2021-07-28 VITALS — BP 113/77 | HR 80 | Ht 67.0 in | Wt 235.0 lb

## 2021-07-28 DIAGNOSIS — G894 Chronic pain syndrome: Secondary | ICD-10-CM | POA: Diagnosis not present

## 2021-07-28 DIAGNOSIS — M170 Bilateral primary osteoarthritis of knee: Secondary | ICD-10-CM | POA: Diagnosis not present

## 2021-07-28 DIAGNOSIS — M159 Polyosteoarthritis, unspecified: Secondary | ICD-10-CM | POA: Diagnosis not present

## 2021-07-28 MED ORDER — TRAMADOL HCL 50 MG PO TABS: 50.0000 mg | ORAL_TABLET | Freq: Three times a day (TID) | ORAL | 5 refills | Status: DC

## 2021-07-28 NOTE — Patient Instructions (Signed)
Hylan G-F 20 intra-articular injection What is this medication? HYLAN G-F 20 (HI lan G F 20) is used to treat osteoarthritis of the knee. It lubricates and cushions the joint, reducing pain in the knee. This medicine may be used for other purposes; ask your health care provider or pharmacist if you have questions. COMMON BRAND NAME(S): Synvisc, Synvisc-One What should I tell my care team before I take this medication? They need to know if you have any of these conditions: severe knee inflammation skin conditions or sensitivity skin or joint infection venous stasis an unusual or allergic reaction to hylan G-F 20, hyaluronan (sodium hyaluronate), eggs, other medicines, foods, dyes, or preservatives pregnant or trying to get pregnant breast-feeding How should I use this medication? This medicine is for injection into the knee joint. It is given by a health care professional in a hospital or clinic setting. Talk to your pediatrician regarding the use of this medicine in children. This medicine is not approved for use in children. Overdosage: If you think you have taken too much of this medicine contact a poison control center or emergency room at once. NOTE: This medicine is only for you. Do not share this medicine with others. What if I miss a dose? Keep appointments for follow-up doses as directed. For Synvisc, you will need weekly injections for 3 doses. It is important not to miss your dose. If you will receive Synvisc-One, then only 1 injection will be needed. Call your doctor or health care professional if you are unable to keep an appointment. What may interact with this medication? Do not take this medicine with any of the following medications: other injections for the joint like steroids or anesthetics certain skin disinfectants like benzalkonium chloride This list may not describe all possible interactions. Give your health care provider a list of all the medicines, herbs,  non-prescription drugs, or dietary supplements you use. Also tell them if you smoke, drink alcohol, or use illegal drugs. Some items may interact with your medicine. What should I watch for while using this medication? Tell your doctor or healthcare professional if your symptoms do not start to get better or if they get worse. Your condition will be monitored carefully while you are receiving this medicine. Most persons get pain relief for up to 6 months after treatment. Avoid strenuous activities (high-impact sports, jogging) or major weight-bearing activities for 48 hours after the injection. What side effects may I notice from receiving this medication? Side effects that you should report to your doctor or health care professional as soon as possible: allergic reactions like skin rash, itching or hives, swelling of the face, lips, or tongue difficulty breathing fever or chills severe joint pain or swelling unusual bleeding or bruising Side effects that usually do not require medical attention (report to your doctor or health care professional if they continue or are bothersome): dizziness flushing general ill feeling or flu-like symptoms headache minor joint pain or swelling muscle pain or cramps pain, redness, irritation or bruising at site of injection This list may not describe all possible side effects. Call your doctor for medical advice about side effects. You may report side effects to FDA at 1-800-FDA-1088. Where should I keep my medication? This drug is given in a hospital or clinic and will not be stored at home. NOTE: This sheet is a summary. It may not cover all possible information. If you have questions about this medicine, talk to your doctor, pharmacist, or health care provider.  2023  Elsevier/Gold Standard (2015-03-20 00:00:00)

## 2021-07-28 NOTE — Progress Notes (Signed)
Indication end-stage osteoarthritis of the Left  knee with pain that limits mobility and does not respond to oral medications.  Ultrasound guidance, 12 Hz linear transducer, long axis view  Medial aspect of the LEFT  knee was imaged, identified joint space, identified patella, femur, tibia. 25-gauge 1.5 inch needle was inserted under ultrasound guidance and 3 mL of 1% lidocaine were infiltrated into the skin and subcutaneous tissue. Then a 21-gauge, 2 inch needle was inserted along the same needle track Into the joint under direct ultrasound visualization. . 6 mL of Synvisc-1 were injected. Patient tolerated procedure well Post procedure instructions given  

## 2021-07-29 DIAGNOSIS — I5020 Unspecified systolic (congestive) heart failure: Secondary | ICD-10-CM | POA: Diagnosis not present

## 2021-07-29 DIAGNOSIS — E785 Hyperlipidemia, unspecified: Secondary | ICD-10-CM

## 2021-07-29 DIAGNOSIS — E039 Hypothyroidism, unspecified: Secondary | ICD-10-CM

## 2021-07-29 DIAGNOSIS — Z87891 Personal history of nicotine dependence: Secondary | ICD-10-CM

## 2021-07-29 DIAGNOSIS — I4891 Unspecified atrial fibrillation: Secondary | ICD-10-CM

## 2021-07-29 DIAGNOSIS — I110 Hypertensive heart disease with heart failure: Secondary | ICD-10-CM | POA: Diagnosis not present

## 2021-07-29 DIAGNOSIS — Z853 Personal history of malignant neoplasm of breast: Secondary | ICD-10-CM

## 2021-07-29 DIAGNOSIS — M8580 Other specified disorders of bone density and structure, unspecified site: Secondary | ICD-10-CM

## 2021-07-29 DIAGNOSIS — J45909 Unspecified asthma, uncomplicated: Secondary | ICD-10-CM

## 2021-07-30 ENCOUNTER — Telehealth: Payer: Self-pay | Admitting: *Deleted

## 2021-07-30 DIAGNOSIS — M170 Bilateral primary osteoarthritis of knee: Secondary | ICD-10-CM

## 2021-07-30 DIAGNOSIS — M47816 Spondylosis without myelopathy or radiculopathy, lumbar region: Secondary | ICD-10-CM

## 2021-07-30 NOTE — Telephone Encounter (Signed)
Shower chair ordered.

## 2021-07-31 NOTE — Telephone Encounter (Signed)
Spoke with patient. Patient states she is in the process of working on [place] transportation for her doctors visits, request PUS to be scheduled at end of June, morning appt.   PUS with possible EMB scheduled for 7/20 at 11:30am, consult and possible EMB with Dr. [name] to follow. Patient aware to return call if she can come earlier or if she needs to make any changes to appt. Patient denies any vaginal bleeding at this time. Advised patient evaluation is still recommended. Patient agreeable.   New PUS and EMB order placed, previous order will expire before appt.   Routing to provider for final review. Patient is agreeable to disposition. Will close encounter.  Cc: [facility]

## 2021-08-06 ENCOUNTER — Telehealth: Payer: Self-pay

## 2021-08-06 ENCOUNTER — Ambulatory Visit (INDEPENDENT_AMBULATORY_CARE_PROVIDER_SITE_OTHER): Payer: Medicare HMO

## 2021-08-06 DIAGNOSIS — M810 Age-related osteoporosis without current pathological fracture: Secondary | ICD-10-CM

## 2021-08-06 DIAGNOSIS — E782 Mixed hyperlipidemia: Secondary | ICD-10-CM

## 2021-08-06 DIAGNOSIS — I10 Essential (primary) hypertension: Secondary | ICD-10-CM

## 2021-08-06 DIAGNOSIS — I480 Paroxysmal atrial fibrillation: Secondary | ICD-10-CM

## 2021-08-06 DIAGNOSIS — I5022 Chronic systolic (congestive) heart failure: Secondary | ICD-10-CM

## 2021-08-06 NOTE — Chronic Care Management (AMB) (Signed)
Chronic Care Management   CCM RN Visit Note  08/06/2021 Name: Danielle [name] MRN: [id number] DOB: [birth year]  Subjective: Danielle [name] is a 76 y.o. year old female who is a primary care patient of [name], [name], MD. The care management team was consulted for assistance with disease management and care coordination needs.    Engaged with patient by telephone for follow up visit in response to provider referral for case management and/or care coordination services.   Consent to Services:  The patient was given information about Chronic Care Management services, agreed to services, and gave verbal consent prior to initiation of services.  Please see initial visit note for detailed documentation.   Patient agreed to services and verbal consent obtained.   Assessment: Review of patient past medical history, allergies, medications, health status, including review of consultants reports, laboratory and other test data, was performed as part of comprehensive evaluation and provision of chronic care management services.   SDOH (Social Determinants of Health) assessments and interventions performed:    CCM Care Plan  Allergies  Allergen Reactions   Lisinopril Cough   Tetanus Toxoid Adsorbed Swelling    Arm swelling   Other Cough    PEANUTS    Outpatient Encounter Medications as of 08/06/2021  Medication Sig   albuterol (PROVENTIL HFA;VENTOLIN HFA) 108 (90 Base) MCG/ACT inhaler Inhale 2 puffs into the lungs every 6 (six) hours as needed for wheezing or shortness of breath.   alendronate (FOSAMAX) 70 MG tablet Take 1 tablet (70 mg total) by mouth once a week. Take with a full glass of water on an empty stomach.   apixaban (ELIQUIS) 5 MG TABS tablet Take 1 tablet (5 mg total) by mouth 2 (two) times daily.   ascorbic acid (VITAMIN C) 500 MG tablet Take 500 mg by mouth daily.   budesonide-formoterol (SYMBICORT) 160-4.5 MCG/ACT inhaler Inhale 2 puffs into the lungs  daily as needed.   carvedilol (COREG) 25 MG tablet Take 1 tablet (25 mg total) by mouth 2 (two) times daily with a meal.   Cholecalciferol 25 MCG (1000 UT) tablet Take 1,000 Units by mouth daily.   fluconazole (DIFLUCAN) 150 MG tablet Take 150 mg by mouth once.   fluticasone (FLONASE) 50 MCG/ACT nasal spray Place 1 spray into both nostrils daily.   methimazole (TAPAZOLE) 5 MG tablet Take 3 tablets (15 mg total) by mouth daily.   Multiple Vitamin (MULTIVITAMIN) tablet Take 1 tablet by mouth daily.   pantoprazole (PROTONIX) 40 MG tablet Take 1 tablet (40 mg total) by mouth daily.   rosuvastatin (CRESTOR) 40 MG tablet Take 1 tablet (40 mg total) by mouth daily.   sacubitril-valsartan (ENTRESTO) 49-51 MG Take 1 tablet by mouth 2 (two) times daily.   senna-docusate (SENOKOT-S) 8.6-50 MG tablet Take 2 tablets by mouth at bedtime.   spironolactone (ALDACTONE) 25 MG tablet Take 1 tablet (25 mg total) by mouth daily.   torsemide (DEMADEX) 20 MG tablet Take 1 tablet (20 mg total) by mouth as needed (weight gain / swelling).   traMADol (ULTRAM) 50 MG tablet Take 1 tablet (50 mg total) by mouth 3 (three) times daily.   trolamine salicylate (ASPERCREME) 10 % cream Apply 1 application topically as needed for muscle pain.   valACYclovir (VALTREX) 500 MG tablet Take on tablet by mouth twice a day for 3 days as needed for an outbreak   zolpidem (AMBIEN) 5 MG tablet Take 0.5-1 tablets (2.5-5 mg total) by mouth at bedtime as needed for sleep.  No facility-administered encounter medications on file as of 08/06/2021.    Patient Active Problem List   Diagnosis Date Noted   Depression, major, single episode, moderate ([place]) 12/08/2020   Gait abnormality 07/31/2020   Paresthesia 07/31/2020   Low back pain with bilateral sciatica 07/31/2020   Pneumonia due to COVID-19 virus    Bacteremia due to Gram-positive bacteria 08/29/2018   Abscess    Cellulitis of left breast 08/28/2018   Cellulitis of left abdominal wall  08/28/2018   Encounter for routine gynecological examination 04/28/2018   Stage 3 chronic kidney disease ([place]) 09/27/2017   Recurrent genital herpes 04/13/2017   Chronic pain disorder 01/25/2017   Malignant neoplasm of upper-outer quadrant of left breast in female, estrogen receptor positive ([place]) 12/16/2016   Chronic knee pain 12/07/2016   Chronic bilateral low back pain without sciatica 12/07/2016   Peripheral musculoskeletal gait disorder 12/07/2016   Encounter for therapeutic drug monitoring 06/16/2016   Generalized osteoarthritis of multiple sites 05/04/2016   Aortic valve disease 04/10/2016   S/P AVR 04/10/2016   NICM (nonischemic cardiomyopathy) ([place]) 04/10/2016   Upper airway cough syndrome 03/14/2016   Class 2 severe obesity with serious comorbidity in adult ([place]) 03/14/2016   COPD (chronic obstructive pulmonary disease) ([place]) 03/11/2016   Thrombocytopenia ([place]) 12/12/2015   Anemia 12/12/2015   Vitamin D deficiency 12/12/2015   Acute urinary retention 12/11/2015   Osteoporosis 12/11/2015   Hip fracture ([place]) 12/10/2015   Aortic atherosclerosis ([place]) 12/10/2015   Lung blebs ([place]) 12/03/2015   Pelvic fracture ([place]) 08/19/2015   Fall at home 08/19/2015   Essential hypertension 08/19/2015   Hyperlipidemia LDL goal <70 08/19/2015   Asthma 08/19/2015   Paroxysmal atrial fibrillation ([place]) 08/19/2015   Hyperthyroidism 08/19/2015   Chronic HFrEF (heart failure with reduced ejection fraction) (HCC)    Coronary artery disease    Aortic stenosis     Conditions to be addressed/monitored:Atrial Fibrillation, CHF, HTN, HLD, Osteoarthritis, and chronic pain  Care Plan : RN Care Manager Plan of Care  Updates made by Dimitri Ped, RN since 08/06/2021 12:00 AM     Problem: Chronic Disease Management and Care Coordination Needs (CHF, HTN, Atrial Fib,HLD, chronic pain)   Priority: High     Long-Range Goal: Establish Plan of Care for Chronic Disease Management Needs (CHF, HTN,  Atrial Fib, HLD,chronic pain)   Start Date: 01/15/2021  Expected End Date: 05/22/2022  Recent Progress: On track  Priority: High  Note:   Current Barriers:  Chronic Disease Management support and education needs related to Atrial Fibrillation, CHF, HTN, HLD, Osteoarthritis, and chronic pain States her weigh has been stable  and she weighed  235 today. Denies any chest pains.  Reports no swelling and she wears compression hose and keeps her legs elevated.  Reports shortness of breath with exertion,  Denies any recent atrial fibrillation. States she trying to eat more vegetables since she was changed to Eliquis. .  States she is checking her B/P 4-5 times a week and it was 113/78 when she last checked it. Denies any dizziness States she tries to follow a low sodium diet and the fluid restriction that the heart failure clinic gave her.  States her lt knee injection was 07/28/21 and she is having less pain in that knee now.  States she is using her Cubbi to exercise her legs.  States she is doing around 1000-1500 steps/day. States she has not been walking but she has been doing leg lifts and squats.  States  her husband has been dx with Parkinson's and he can not drive anymore.  States she has looked into SCAT but has not done anything yet  RNCM Clinical Goal(s):  Patient will verbalize understanding of plan for management of Atrial Fibrillation, CHF, HTN, HLD, Osteoarthritis, and chronic pain as evidenced by voiced adherence to plan of care verbalize basic understanding of  Atrial Fibrillation, CHF, HTN, HLD, Osteoarthritis, and chronic pain disease process and self health management plan as evidenced by voiced understanding and teach back take all medications exactly as prescribed and will call provider for medication related questions as evidenced by dispense report and pt verbalization attend all scheduled medical appointments: GYN 09/17/21 HF clinic 10/13/21, Pain clinic 10/27/21, Dr. Saunders Revel 11/25/21, Annual  Wellness Visit 02/01/22 as evidenced by medical records demonstrate Improved adherence to prescribed treatment plan for Atrial Fibrillation, CHF, HTN, HLD, Osteoarthritis, and chronic pain as evidenced by reading within limits, voiced adherence to plan of care continue to work with RN Care Manager to address care management and care coordination needs related to  Atrial Fibrillation, CHF, HTN, HLD, Osteoarthritis, and chronic pain as evidenced by adherence to CM Team Scheduled appointments through collaboration with RN Care manager, provider, and care team.   Interventions: 1:1 collaboration with primary care provider regarding development and update of comprehensive plan of care as evidenced by provider attestation and co-signature Inter-disciplinary care team collaboration (see longitudinal plan of care) Evaluation of current treatment plan related to  self management and patient's adherence to plan as established by provider Referral to care guide for transportation resources  AFIB Interventions: (Status:  Goal on track:  Yes.) Long Term Goal   Counseled on increased risk of stroke due to Afib and benefits of anticoagulation for stroke prevention Reviewed importance of adherence to anticoagulant exactly as prescribed Counseled on bleeding risk associated with Eliquis and importance of self-monitoring for signs/symptoms of bleeding Counseled on seeking medical attention after a head injury or if there is blood in the urine/stool Afib action plan reviewed Reinforced  s/sx of atrial fib and when to call provider   Heart Failure Interventions:  (Status:  Goal on track:  Yes.) Long Term Goal Basic overview and discussion of pathophysiology of Heart Failure reviewed Provided education on low sodium diet Discussed importance of daily weight and advised patient to weigh and record daily Reviewed role of diuretics in prevention of fluid overload and management of heart failure; Discussed the  importance of keeping all appointments with provider Referral made to community resources care guide team for assistance with transportation; Reinforced s/sx of HF to call provider.   Reinforced to follow low sodium heart healthy diet  Hyperlipidemia Interventions:  (Status:  Condition stable.  Not addressed this visit.) Long Term Goal Medication review performed; medication list updated in electronic medical record.  Counseled on importance of regular laboratory monitoring as prescribed Reviewed role and benefits of statin for ASCVD risk reduction Reviewed importance of limiting foods high in cholesterol Reviewed exercise goals and target of 150 minutes per week  Hypertension Interventions:  (Status:  Goal on track:  Yes.) Long Term Goal Last practice recorded BP readings:  BP Readings from Last 3 Encounters:  07/28/21 113/77  07/14/21 124/80  05/20/21 102/70  Most recent eGFR/CrCl:  Lab Results  Component Value Date   EGFR 55 (L) 12/22/2016    No components found for: CRCL  Evaluation of current treatment plan related to hypertension self management and patient's adherence to plan as established by provider Provided  education to patient re: stroke prevention, s/s of heart attack and stroke Reviewed medications with patient and discussed importance of compliance Discussed plans with patient for ongoing care management follow up and provided patient with direct contact information for care management team Advised patient, providing education and rationale, to monitor blood pressure daily and record, calling PCP for findings outside established parameters Provided education on prescribed diet low sodium heart healthy Reinforced to continue to work on weight loss. Reinforced to continue to get exercise by using her Cubbi  Pain Interventions:  (Status:  Goal on track:  Yes.) Long Term Goal Pain assessment performed Medications reviewed Reviewed provider established plan for pain  management Discussed importance of adherence to all scheduled medical appointments Counseled on the importance of reporting any/all new or changed pain symptoms or management strategies to pain management provider Advised patient to report to care team affect of pain on daily activities Discussed use of relaxation techniques and/or diversional activities to assist with pain reduction (distraction, imagery, relaxation, massage, acupressure, TENS, heat, and cold application Reinforced to continue to do leg exercises. Reviewed to call provider if knee pain worsens      Patient Goals/Self-Care Activities: Take all medications as prescribed Attend all scheduled provider appointments Call pharmacy for medication refills 3-7 days in advance of running out of medications Call provider office for new concerns or questions  call office if I gain more than 2 pounds in one day or 5 pounds in one week keep legs up while sitting track weight in diary use salt in moderation watch for swelling in feet, ankles and legs every day follow rescue plan if symptoms flare-up check blood pressure daily choose a place to take my blood pressure (home, clinic or office, retail store) take blood pressure log to all doctor appointments take medications for blood pressure exactly as prescribed call for medicine refill 2 or 3 days before it runs out take all medications exactly as prescribed call doctor with any symptoms you believe are related to your medicine - learn how to meditate - learn relaxation techniques - practice acceptance of chronic pain - practice relaxation or meditation daily - tell myself I can (not I can't) - think of new ways to do favorite things - use distraction techniques - use relaxation during pain Follow Up Plan:  Telephone follow up appointment with care management team member scheduled for:  09/10/21 The patient has been provided with contact information for the care management team and  has been advised to call with any health related questions or concerns.        Plan:Telephone follow up appointment with care management team member scheduled for:  09/10/21 The patient has been provided with contact information for the care management team and has been advised to call with any health related questions or concerns.  Peter Garter RN, Jackquline [place], CDE Care Management Coordinator [facility] [phone]

## 2021-08-06 NOTE — Patient Instructions (Signed)
Visit Information  Thank you for taking time to visit with me today. Please don't hesitate to contact me if I can be of assistance to you before our next scheduled telephone appointment.  Following are the goals we discussed today:  Take all medications as prescribed Attend all scheduled provider appointments Call pharmacy for medication refills 3-7 days in advance of running out of medications Call provider office for new concerns or questions  call office if I gain more than 2 pounds in one day or 5 pounds in one week keep legs up while sitting track weight in diary use salt in moderation watch for swelling in feet, ankles and legs every day follow rescue plan if symptoms flare-up check blood pressure daily choose a place to take my blood pressure (home, clinic or office, retail store) take blood pressure log to all doctor appointments take medications for blood pressure exactly as prescribed call for medicine refill 2 or 3 days before it runs out take all medications exactly as prescribed call doctor with any symptoms you believe are related to your medicine  learn how to meditate - learn relaxation techniques - practice acceptance of chronic pain - practice relaxation or meditation daily - tell myself I can (not I can't) - think of new ways to do favorite things - use distraction techniques - use relaxation during pain  Our next appointment is by telephone on 09/10/21 at 3 PM  Please call the care guide team at [phone] if you need to cancel or reschedule your appointment.   If you are experiencing a Mental Health or [place] or need someone to talk to, please call the Suicide and Crisis Lifeline: 988 call the [place] National Suicide Prevention Lifeline: 931-707-5503 or TTY: 262-148-7617 TTY 615-506-3225) to talk to a trained counselor call 1-800-273-TALK (toll free, 24 hour hotline) go to [facility] [address]  [place] [phone]) call 911   Patient verbalizes understanding of instructions and care plan provided today and agrees to view in [place]. Active MyChart status and patient understanding of how to access instructions and care plan via MyChart confirmed with patient.     Peter Garter RN, Jackquline [place], CDE Care Management Coordinator [facility] [phone]

## 2021-08-06 NOTE — Telephone Encounter (Signed)
   Telephone encounter was:  Successful.  08/06/2021 Name: Naomie Crow MRN: [id number] DOB: 1946-01-24  Micole Delehanty is a 76 y.o. year old female who is a primary care patient of [name], [name], MD . The community resource team was consulted for assistance with Transportation Needs   Care guide performed the following interventions: Patient provided with information about care guide support team and interviewed to confirm resource needs.Patient husband cant drive anymore due to illness so patient will need other transportation resources for the future, Patient asked if I could mail resources and call her back next monday or tuesday to call together and set up ride information  Follow Up Plan:  Care guide will follow up with patient by phone over the next week    [place], Care Management  [phone] [address], [place], [place] [address] Phone: [phone] Email: [email or web address]

## 2021-08-12 ENCOUNTER — Telehealth (HOSPITAL_COMMUNITY): Payer: Self-pay | Admitting: Pharmacy Technician

## 2021-08-12 ENCOUNTER — Encounter (HOSPITAL_COMMUNITY): Payer: Self-pay

## 2021-08-12 NOTE — Progress Notes (Unsigned)
Medication Samples have been provided to the patient.  Drug name: Eliquis       Strength: 5 mg        Qty: 2  LOT: [id number]  Exp.Date: 05/2023  Dosing instructions: Take 1 tablet Twice daily   The patient has been instructed regarding the correct time, dose, and frequency of taking this medication, including desired effects and most common side effects.   Juanita Laster Gearl Kimbrough 9:31 AM 08/12/2021

## 2021-08-12 NOTE — Telephone Encounter (Signed)
Advanced Heart Failure Patient Advocate Encounter  Patient called and left message regarding Eliquis assistance. Attempted to call the patient, left message to return the call.   [name], CPhT

## 2021-08-17 NOTE — Telephone Encounter (Signed)
Advanced Heart Failure Patient Advocate Encounter  Patient called in requesting assistance for Eliquis. I called the patient back and left a detailed message regarding steps for BMS assistance. Requested a return call.  [name], CPhT

## 2021-08-20 ENCOUNTER — Telehealth: Payer: Self-pay

## 2021-08-20 NOTE — Telephone Encounter (Signed)
   Telephone encounter was:  Unsuccessful.  08/20/2021 Name: Tameah Mihalko MRN: [id number] DOB: 1945/03/17  Unsuccessful outbound call made today to assist with:  Transportation Needs    A HIPAA compliant voice message was left requesting a return call.  Follow up call. Left a message to call back at earliest convenience.      [place], Care Management  [phone] [address], [place], [place] [address] Phone: [phone] Email: [email or web address]

## 2021-08-25 ENCOUNTER — Telehealth: Payer: Self-pay

## 2021-08-28 DIAGNOSIS — I110 Hypertensive heart disease with heart failure: Secondary | ICD-10-CM

## 2021-08-28 DIAGNOSIS — I509 Heart failure, unspecified: Secondary | ICD-10-CM

## 2021-08-28 DIAGNOSIS — I4891 Unspecified atrial fibrillation: Secondary | ICD-10-CM

## 2021-08-28 DIAGNOSIS — E785 Hyperlipidemia, unspecified: Secondary | ICD-10-CM

## 2021-09-04 ENCOUNTER — Telehealth (HOSPITAL_COMMUNITY): Payer: Self-pay | Admitting: Pharmacy Technician

## 2021-09-04 NOTE — Telephone Encounter (Signed)
Advanced Heart Failure Patient Advocate Encounter  Sent in BMS application for Eliquis assistance. OOP and POI included.

## 2021-09-10 ENCOUNTER — Ambulatory Visit: Payer: Medicare HMO

## 2021-09-10 DIAGNOSIS — I5022 Chronic systolic (congestive) heart failure: Secondary | ICD-10-CM

## 2021-09-10 DIAGNOSIS — I480 Paroxysmal atrial fibrillation: Secondary | ICD-10-CM

## 2021-09-10 DIAGNOSIS — G8929 Other chronic pain: Secondary | ICD-10-CM

## 2021-09-10 DIAGNOSIS — I10 Essential (primary) hypertension: Secondary | ICD-10-CM

## 2021-09-10 NOTE — Chronic Care Management (AMB) (Signed)
Care Management    RN Visit Note  09/10/2021 Name: Danielle [name] MRN: [id number] DOB: [birth year]  Subjective: Danielle [name] is a 76 y.o. year old female who is a primary care patient of [name], [name], MD. The care management team was consulted for assistance with disease management and care coordination needs.    Engaged with patient by telephone for follow up visit in response to provider referral for case management and/or care coordination services.   Consent to Services:   Ms. Schmutz was given information about Care Management services today including:  Care Management services includes personalized support from designated clinical staff supervised by her physician, including individualized plan of care and coordination with other care providers 24/7 contact phone numbers for assistance for urgent and routine care needs. The patient may stop case management services at any time by phone call to the office staff.  Patient agreed to services and consent obtained.   Assessment: Review of patient past medical history, allergies, medications, health status, including review of consultants reports, laboratory and other test data, was performed as part of comprehensive evaluation and provision of chronic care management services.   SDOH (Social Determinants of Health) assessments and interventions performed:    Care Plan  Allergies  Allergen Reactions   Lisinopril Cough   Tetanus Toxoid Adsorbed Swelling    Arm swelling   Other Cough    PEANUTS    Outpatient Encounter Medications as of 09/10/2021  Medication Sig   albuterol (PROVENTIL HFA;VENTOLIN HFA) 108 (90 Base) MCG/ACT inhaler Inhale 2 puffs into the lungs every 6 (six) hours as needed for wheezing or shortness of breath.   alendronate (FOSAMAX) 70 MG tablet Take 1 tablet (70 mg total) by mouth once a week. Take with a full glass of water on an empty stomach.   apixaban (ELIQUIS) 5 MG  TABS tablet Take 1 tablet (5 mg total) by mouth 2 (two) times daily.   ascorbic acid (VITAMIN C) 500 MG tablet Take 500 mg by mouth daily.   budesonide-formoterol (SYMBICORT) 160-4.5 MCG/ACT inhaler Inhale 2 puffs into the lungs daily as needed.   carvedilol (COREG) 25 MG tablet Take 1 tablet (25 mg total) by mouth 2 (two) times daily with a meal.   Cholecalciferol 25 MCG (1000 UT) tablet Take 1,000 Units by mouth daily.   fluconazole (DIFLUCAN) 150 MG tablet Take 150 mg by mouth once.   fluticasone (FLONASE) 50 MCG/ACT nasal spray Place 1 spray into both nostrils daily.   methimazole (TAPAZOLE) 5 MG tablet Take 3 tablets (15 mg total) by mouth daily.   Multiple Vitamin (MULTIVITAMIN) tablet Take 1 tablet by mouth daily.   pantoprazole (PROTONIX) 40 MG tablet Take 1 tablet (40 mg total) by mouth daily.   rosuvastatin (CRESTOR) 40 MG tablet Take 1 tablet (40 mg total) by mouth daily.   sacubitril-valsartan (ENTRESTO) 49-51 MG Take 1 tablet by mouth 2 (two) times daily.   senna-docusate (SENOKOT-S) 8.6-50 MG tablet Take 2 tablets by mouth at bedtime.   spironolactone (ALDACTONE) 25 MG tablet Take 1 tablet (25 mg total) by mouth daily.   torsemide (DEMADEX) 20 MG tablet Take 1 tablet (20 mg total) by mouth as needed (weight gain / swelling).   traMADol (ULTRAM) 50 MG tablet Take 1 tablet (50 mg total) by mouth 3 (three) times daily.   trolamine salicylate (ASPERCREME) 10 % cream Apply 1 application topically as needed for muscle pain.   valACYclovir (VALTREX) 500 MG tablet Take on tablet by  mouth twice a day for 3 days as needed for an outbreak   zolpidem (AMBIEN) 5 MG tablet Take 0.5-1 tablets (2.5-5 mg total) by mouth at bedtime as needed for sleep.   No facility-administered encounter medications on file as of 09/10/2021.    Patient Active Problem List   Diagnosis Date Noted   Depression, major, single episode, moderate ([place]) 12/08/2020   Gait abnormality 07/31/2020   Paresthesia 07/31/2020    Low back pain with bilateral sciatica 07/31/2020   Pneumonia due to COVID-19 virus    Bacteremia due to Gram-positive bacteria 08/29/2018   Abscess    Cellulitis of left breast 08/28/2018   Cellulitis of left abdominal wall 08/28/2018   Encounter for routine gynecological examination 04/28/2018   Stage 3 chronic kidney disease ([place]) 09/27/2017   Recurrent genital herpes 04/13/2017   Chronic pain disorder 01/25/2017   Malignant neoplasm of upper-outer quadrant of left breast in female, estrogen receptor positive ([place]) 12/16/2016   Chronic knee pain 12/07/2016   Chronic bilateral low back pain without sciatica 12/07/2016   Peripheral musculoskeletal gait disorder 12/07/2016   Encounter for therapeutic drug monitoring 06/16/2016   Generalized osteoarthritis of multiple sites 05/04/2016   Aortic valve disease 04/10/2016   S/P AVR 04/10/2016   NICM (nonischemic cardiomyopathy) ([place]) 04/10/2016   Upper airway cough syndrome 03/14/2016   Class 2 severe obesity with serious comorbidity in adult [facility]) 03/14/2016   COPD (chronic obstructive pulmonary disease) ([place]) 03/11/2016   Thrombocytopenia ([place]) 12/12/2015   Anemia 12/12/2015   Vitamin D deficiency 12/12/2015   Acute urinary retention 12/11/2015   Osteoporosis 12/11/2015   Hip fracture ([place]) 12/10/2015   Aortic atherosclerosis ([place]) 12/10/2015   Lung blebs ([place]) 12/03/2015   Pelvic fracture ([place]) 08/19/2015   Fall at home 08/19/2015   Essential hypertension 08/19/2015   Hyperlipidemia LDL goal <70 08/19/2015   Asthma 08/19/2015   Paroxysmal atrial fibrillation ([place]) 08/19/2015   Hyperthyroidism 08/19/2015   Chronic HFrEF (heart failure with reduced ejection fraction) ([facility])    Coronary artery disease    Aortic stenosis     Conditions to be addressed/monitored: Atrial Fibrillation, CHF, HTN, and HLD  Care Plan : RN Care Manager Plan of Care  Updates made by Dimitri Ped, RN since 09/10/2021 12:00 AM  Completed 09/10/2021    Problem: Chronic Disease Management and Care Coordination Needs (CHF, HTN, Atrial Fib,HLD, chronic pain) Resolved 09/10/2021  Priority: High     Long-Range Goal: Establish Plan of Care for Chronic Disease Management Needs (CHF, HTN, Atrial Fib, HLD,chronic pain) Completed 09/10/2021  Start Date: 01/15/2021  Expected End Date: 05/22/2022  Recent Progress: On track  Priority: High  Note:   Case closed goals met Current Barriers:  Chronic Disease Management support and education needs related to Atrial Fibrillation, CHF, HTN, HLD, Osteoarthritis, and chronic pain States her weigh has been stable  and she weighed  242 today. Denies any chest pains.  Reports no swelling and she wears compression hose and keeps her legs elevated.  Denies any increase in shortness of breath with exertion,  Denies any recent atrial fibrillation. States she trying to eat more vegetables since she was changed to Eliquis. .  States she is checking her B/P 4-5 times a week and it has been good Denies any dizziness States she tries to follow a low sodium diet and the fluid restriction that the heart failure clinic gave her.   States she is using her Cubbi to exercise her legs.  States she  is doing around 1000-1500 steps/day every other day. States she has not been walking but she has been doing leg lifts and squats.  States her husband has been dx with Parkinson's and he can not drive anymore.  States she has filled out the  SCAT form and she got the resources from the care guide  RNCM Clinical Goal(s):  Patient will verbalize understanding of plan for management of Atrial Fibrillation, CHF, HTN, HLD, Osteoarthritis, and chronic pain as evidenced by voiced adherence to plan of care verbalize basic understanding of  Atrial Fibrillation, CHF, HTN, HLD, Osteoarthritis, and chronic pain disease process and self health management plan as evidenced by voiced understanding and teach back take all medications exactly as prescribed and  will call provider for medication related questions as evidenced by dispense report and pt verbalization attend all scheduled medical appointments: GYN 09/17/21 HF clinic 10/13/21, Pain clinic 10/27/21, Dr. Saunders Revel 11/25/21, Annual Wellness Visit 02/01/22 as evidenced by medical records demonstrate Improved adherence to prescribed treatment plan for Atrial Fibrillation, CHF, HTN, HLD, Osteoarthritis, and chronic pain as evidenced by reading within limits, voiced adherence to plan of care continue to work with RN Care Manager to address care management and care coordination needs related to  Atrial Fibrillation, CHF, HTN, HLD, Osteoarthritis, and chronic pain as evidenced by adherence to CM Team Scheduled appointments through collaboration with RN Care manager, provider, and care team.   Interventions: 1:1 collaboration with primary care provider regarding development and update of comprehensive plan of care as evidenced by provider attestation and co-signature Inter-disciplinary care team collaboration (see longitudinal plan of care) Evaluation of current treatment plan related to  self management and patient's adherence to plan as established by provider  AFIB Interventions: (Status:  Goal Met.) Long Term Goal   Counseled on increased risk of stroke due to Afib and benefits of anticoagulation for stroke prevention Reviewed importance of adherence to anticoagulant exactly as prescribed Counseled on bleeding risk associated with Eliquis and importance of self-monitoring for signs/symptoms of bleeding Counseled on seeking medical attention after a head injury or if there is blood in the urine/stool Afib action plan reviewed Reinforced  s/sx of atrial fib and when to call provider   Heart Failure Interventions:  (Status:  Goal Met.) Long Term Goal Basic overview and discussion of pathophysiology of Heart Failure reviewed Provided education on low sodium diet Discussed importance of daily weight and advised  patient to weigh and record daily Reviewed role of diuretics in prevention of fluid overload and management of heart failure; Discussed the importance of keeping all appointments with provider Referral made to community resources care guide team for assistance with transportation; Reinforced s/sx of HF to call provider.   Reinforced to follow low sodium heart healthy diet  Hyperlipidemia Interventions:  (Status:  Goal Met.) Long Term Goal Medication review performed; medication list updated in electronic medical record.  Counseled on importance of regular laboratory monitoring as prescribed Reviewed role and benefits of statin for ASCVD risk reduction Reviewed importance of limiting foods high in cholesterol Reviewed exercise goals and target of 150 minutes per week  Hypertension Interventions:  (Status:  Goal Met.) Long Term Goal Last practice recorded BP readings:  BP Readings from Last 3 Encounters:  07/28/21 113/77  07/14/21 124/80  05/20/21 102/70  Most recent eGFR/CrCl:  Lab Results  Component Value Date   EGFR 55 (L) 12/22/2016    No components found for: CRCL  Evaluation of current treatment plan related to hypertension self management  and patient's adherence to plan as established by provider Provided education to patient re: stroke prevention, s/s of heart attack and stroke Reviewed medications with patient and discussed importance of compliance Discussed plans with patient for ongoing care management follow up and provided patient with direct contact information for care management team Advised patient, providing education and rationale, to monitor blood pressure daily and record, calling PCP for findings outside established parameters Provided education on prescribed diet low sodium heart healthy Reinforced to continue to work on weight loss. Reinforced to continue to get exercise by using her Cubbi  Pain Interventions:  (Status:  Goal Met.) Long Term Goal Pain  assessment performed Medications reviewed Reviewed provider established plan for pain management Discussed importance of adherence to all scheduled medical appointments Counseled on the importance of reporting any/all new or changed pain symptoms or management strategies to pain management provider Advised patient to report to care team affect of pain on daily activities Discussed use of relaxation techniques and/or diversional activities to assist with pain reduction (distraction, imagery, relaxation, massage, acupressure, TENS, heat, and cold application Reinforced to continue to do leg exercises. Reinforced to call provider if knee pain worsens      Patient Goals/Self-Care Activities: Take all medications as prescribed Attend all scheduled provider appointments Call pharmacy for medication refills 3-7 days in advance of running out of medications Call provider office for new concerns or questions  call office if I gain more than 2 pounds in one day or 5 pounds in one week keep legs up while sitting track weight in diary use salt in moderation watch for swelling in feet, ankles and legs every day follow rescue plan if symptoms flare-up check blood pressure daily choose a place to take my blood pressure (home, clinic or office, retail store) take blood pressure log to all doctor appointments take medications for blood pressure exactly as prescribed call for medicine refill 2 or 3 days before it runs out take all medications exactly as prescribed call doctor with any symptoms you believe are related to your medicine - learn how to meditate - learn relaxation techniques - practice acceptance of chronic pain - practice relaxation or meditation daily - tell myself I can (not I can't) - think of new ways to do favorite things - use distraction techniques - use relaxation during pain Follow Up Plan:  The patient has been provided with contact information for the care management team and  has been advised to call with any health related questions or concerns.  No further follow up required: Case closed goals met        Plan: The patient has been provided with contact information for the care management team and has been advised to call with any health related questions or concerns.  No further follow up required: Case closed goals met Peter Garter RN, [facility], CDE Care Management Coordinator [facility] [phone]

## 2021-09-10 NOTE — Patient Instructions (Signed)
Visit Information Case closed goals met Thank you for allowing me to share the care management and care coordination services that are available to you as part of your health plan and services through your primary care provider and medical home. Please reach out to me at [phone] if the care management/care coordination team may be of assistance to you in the future.   Finlay Mills RN, BSN,CCM, CDE Care Management Coordinator [facility] [phone]   

## 2021-09-14 NOTE — Telephone Encounter (Signed)
Advanced Heart Failure Patient Advocate Encounter   Patient was approved to receive Eliquis from [facility]  Effective dates: 09/09/21 through 02/28/22  Spoke with patient. She will receive first shipment next week. Document scanned to chart.   [name], CPhT

## 2021-09-15 NOTE — Progress Notes (Signed)
GYNECOLOGY  VISIT   HPI: 76 y.o.   Married  [place] American  female   G0P0000 with No LMP recorded. Patient is postmenopausal.   here for pelvic ultrasound with possible EMB. Pt reports last episode of vaginal bleeding months ago. Since early February.   Patient is having recurrent postmenopausal bleeding. She has known cervical stenosis.   Prior pelvic US 10/14/20: Pelvic US  Uterus 6.99 x 4.51 x 4.06 cm.  EMS 11.79 mm.  Thickened walls with cystic change.  Vascularity noted in the walls.  Clear fluid in endometrial canal.   Ovaries atrophic.  Tubes with serpinginous cystic change, consistent with hydrosalpinges.  Right 5.1 x 1.2 cm.  Left 6.1 x 2.4 cm. No free fluid.   She had an ultrasound guided hysteroscopy with dilation and curettage on 11/17/20.  Final pathology showed benign endometrial polyp with no hyperplasia or malignancy.  On Eliquis.   Has multiple chronic medical issues.   GYNECOLOGIC HISTORY: No LMP recorded. Patient is postmenopausal. Contraception:  PMP Menopausal hormone therapy:  none Last mammogram:  01-16-21 Hx Lt.Br.Ca/Diag.Bil/Neg/BiRads2 Last pap smear:  08-20-20 Neg:Neg HR HPV.         OB History     Gravida  0   Para  0   Term  0   Preterm  0   AB  0   Living  0      SAB  0   IAB  0   Ectopic  0   Multiple  0   Live Births  0              Patient Active Problem List   Diagnosis Date Noted   Depression, major, single episode, moderate ([place]) 12/08/2020   Gait abnormality 07/31/2020   Paresthesia 07/31/2020   Low back pain with bilateral sciatica 07/31/2020   Pneumonia due to COVID-19 virus    Bacteremia due to Gram-positive bacteria 08/29/2018   Abscess    Cellulitis of left breast 08/28/2018   Cellulitis of left abdominal wall 08/28/2018   Encounter for routine gynecological examination 04/28/2018   Stage 3 chronic kidney disease ([place]) 09/27/2017   Recurrent genital herpes 04/13/2017   Chronic pain disorder  01/25/2017   Malignant neoplasm of upper-outer quadrant of left breast in female, estrogen receptor positive ([place]) 12/16/2016   Chronic knee pain 12/07/2016   Chronic bilateral low back pain without sciatica 12/07/2016   Peripheral musculoskeletal gait disorder 12/07/2016   Encounter for therapeutic drug monitoring 06/16/2016   Generalized osteoarthritis of multiple sites 05/04/2016   Aortic valve disease 04/10/2016   S/P AVR 04/10/2016   NICM (nonischemic cardiomyopathy) ([place]) 04/10/2016   Upper airway cough syndrome 03/14/2016   Class 2 severe obesity with serious comorbidity in adult [facility]) 03/14/2016   COPD (chronic obstructive pulmonary disease) ([place]) 03/11/2016   Thrombocytopenia ([place]) 12/12/2015   Anemia 12/12/2015   Vitamin D deficiency 12/12/2015   Acute urinary retention 12/11/2015   Osteoporosis 12/11/2015   Hip fracture ([place]) 12/10/2015   Aortic atherosclerosis ([place]) 12/10/2015   Lung blebs ([place]) 12/03/2015   Pelvic fracture ([place]) 08/19/2015   Fall at home 08/19/2015   Essential hypertension 08/19/2015   Hyperlipidemia LDL goal <70 08/19/2015   Asthma 08/19/2015   Paroxysmal atrial fibrillation ([place]) 08/19/2015   Hyperthyroidism 08/19/2015   Chronic HFrEF (heart failure with reduced ejection fraction) ([place])    Coronary artery disease    Aortic stenosis     Past Medical History:  Diagnosis Date   Anticoagulated on  Coumadin    managed by cardiology   Anxiety    Chronic constipation    Chronic systolic CHF (congestive heart failure) ([place]) 2016   cardiologist--- dr end and followed by CHF clinic   CKD (chronic kidney disease), stage III ([place])    COPD (chronic obstructive pulmonary disease) ([place])    previous had seen pulmonology --- dr Melvyn Novas, note in epic 03-14-2016, dx COPD GOLD 0   Coronary artery disease    cardiologist--- dr Durward Mallard;  last cardiac cath 01-10-20202 mild nonobstructive disease proxLAD;  per pt, had LHC prior to AVR in [place] 2016   DOE (dyspnea on  exertion)    GERD (gastroesophageal reflux disease)    History of 2019 novel coronavirus disease (COVID-19) 12/19/2019   hospital admission w/ covid pneumonia , no intubatiion,  per pt symptoms resolved and back to baseline   History of cerebrovascular accident (CVA) with residual deficit    CVA in 2000 without residuals;  CVA post op AVR 01/ 2016 with residual right hand weakness   History of gastritis    History of pneumothorax    s/p right vats 11-19-2014 and  s/p left vats  12-13-2015   (both spontenaous due to bleb)   Hyperlipidemia    Hypertension    Hyperthyroidism    per pt followed by pcp,  dx approx 2014   Malignant neoplasm of upper-outer quadrant of left breast in female, estrogen receptor positive ([place]) 11/2016   oncologist-- dr Burr Medico--- dx 10/ 2018  ,  s/p left lumptectomy with node dissection;  completed radiation 04-15-2017, no chemo   Multiple thyroid nodules    in care everywhere pt had left thyroid nodule biospy 09-18-2014 benign   NICM (nonischemic cardiomyopathy) ([place]) 2016   2016  ef 30%;  last echo in epic ef 45%   OA (osteoarthritis)    Osteoporosis    Paroxysmal atrial fibrillation [facility])    cardiologist--- dr end   Personal history of radiation therapy    left breast cancer  03-08-2017  to 04-05-2017   Thrombocytopenia [facility])    followed by dr Burr Medico   Valvular stenosis, aortic 03/12/2014   Status post bioprosthetic AVR  in [place]  for severe AS   Wears dentures    full upper    Past Surgical History:  Procedure Laterality Date   AORTIC VALVE REPLACEMENT  03/12/2014   [facility] [place];  St Jude 71m, medial Trifecta Bioprosthesis   BREAST LUMPECTOMY WITH RADIOACTIVE SEED AND SENTINEL LYMPH NODE BIOPSY Left 01/14/2017   Procedure: LEFT BREAST LUMPECTOMY WITH RADIOACTIVE SEED AND SENTINEL LYMPH NODE BIOPSY;  Surgeon: WRolm Bookbinder MD;  [place]  Service: General;  Laterality: Left;   CARDIAC CATHETERIZATION  02/12/2014    [facility] [place] N/A 11/17/2020   Procedure: DILATATION AND CURETTAGE /HYSTEROSCOPY WITH MYOSURE;  Surgeon: ANunzio Cobbs MD;  [place]  Service: Gynecology;  Laterality: N/A;   INTRAMEDULLARY (IM) NAIL INTERTROCHANTERIC Left 12/10/2015   Procedure: INTRAMEDULLARY (IM) NAIL INTERTROCHANTRIC;  Surgeon: NLeandrew Koyanagi MD;  [place]  Service: Orthopedics;  Laterality: Left;   OPERATIVE ULTRASOUND N/A 11/17/2020   Procedure: OPERATIVE ULTRASOUND;  Surgeon: ANunzio Cobbs MD;  [facility]  Service: Gynecology;  Laterality: N/A;   PLEURADESIS Left 12/03/2015   Procedure: PLEURADESIS;  Surgeon: SMelrose Nakayama MD;  [place]  Service: Thoracic;  Laterality: Left;   RESECTION OF APICAL BLEB Left 12/03/2015   Procedure: BLEBECTOMY;  Surgeon: Melrose Nakayama, MD;  Location: [place];  Service: Thoracic;  Laterality: Left;   RIGHT/LEFT HEART CATH AND CORONARY ANGIOGRAPHY N/A 03/10/2020   Procedure: RIGHT/LEFT HEART CATH AND CORONARY ANGIOGRAPHY;  Surgeon: [name], MD;  Location: [place] [facility];  Service: Cardiovascular;  Laterality: N/A;   VIDEO ASSISTED THORACOSCOPY Left 12/03/2015   Procedure: VIDEO ASSISTED THORACOSCOPY;  Surgeon: Melrose Nakayama, MD;  Location: [place];  Service: Thoracic;  Laterality: Left;   VIDEO ASSISTED THORACOSCOPY (VATS) W/TALC PLEUADESIS Right 11/19/2014   in [place]    Current Outpatient Medications  Medication Sig Dispense Refill   albuterol (PROVENTIL HFA;VENTOLIN HFA) 108 (90 Base) MCG/ACT inhaler Inhale 2 puffs into the lungs every 6 (six) hours as needed for wheezing or shortness of breath.     alendronate (FOSAMAX) 70 MG tablet Take 1 tablet (70 mg total) by mouth once a week. Take with a full glass of water on an empty stomach. 13 tablet 3   apixaban (ELIQUIS) 5 MG TABS tablet Take 1 tablet (5 mg total) by mouth 2 (two)  times daily. 180 tablet 3   ascorbic acid (VITAMIN C) 500 MG tablet Take 500 mg by mouth daily.     budesonide-formoterol (SYMBICORT) 160-4.5 MCG/ACT inhaler Inhale 2 puffs into the lungs daily as needed. 3 each 3   carvedilol (COREG) 25 MG tablet Take 1 tablet (25 mg total) by mouth 2 (two) times daily with a meal. 180 tablet 3   Cholecalciferol 25 MCG (1000 UT) tablet Take 1,000 Units by mouth daily.     fluconazole (DIFLUCAN) 150 MG tablet Take 150 mg by mouth once.     fluticasone (FLONASE) 50 MCG/ACT nasal spray Place 1 spray into both nostrils daily. 16 g 3   methimazole (TAPAZOLE) 5 MG tablet Take 3 tablets (15 mg total) by mouth daily. 270 tablet 3   Multiple Vitamin (MULTIVITAMIN) tablet Take 1 tablet by mouth daily.     pantoprazole (PROTONIX) 40 MG tablet Take 1 tablet (40 mg total) by mouth daily. 30 tablet 11   rosuvastatin (CRESTOR) 40 MG tablet Take 1 tablet (40 mg total) by mouth daily. 90 tablet 3   sacubitril-valsartan (ENTRESTO) 49-51 MG Take 1 tablet by mouth 2 (two) times daily. 60 tablet 4   senna-docusate (SENOKOT-S) 8.6-50 MG tablet Take 2 tablets by mouth at bedtime.     spironolactone (ALDACTONE) 25 MG tablet Take 1 tablet (25 mg total) by mouth daily. 90 tablet 3   torsemide (DEMADEX) 20 MG tablet Take 1 tablet (20 mg total) by mouth as needed (weight gain / swelling).     traMADol (ULTRAM) 50 MG tablet Take 1 tablet (50 mg total) by mouth 3 (three) times daily. 90 tablet 5   trolamine salicylate (ASPERCREME) 10 % cream Apply 1 application topically as needed for muscle pain.     valACYclovir (VALTREX) 500 MG tablet Take on tablet by mouth twice a day for 3 days as needed for an outbreak 30 tablet 0   zolpidem (AMBIEN) 5 MG tablet Take 0.5-1 tablets (2.5-5 mg total) by mouth at bedtime as needed for sleep. 15 tablet 0   No current facility-administered medications for this visit.     ALLERGIES: Lisinopril, Tetanus toxoid adsorbed, Other, and Peanut allergen  powder-dnfp  Family History  Problem Relation Age of Onset   Diabetes Mother    Heart attack Mother 62  Diabetes Father    Lung cancer Father    Diabetes Sister    Thyroid disease Sister    Diabetes Sister    HIV Brother    Colon polyps Neg Hx    Colon cancer Neg Hx     Social History   Socioeconomic History   Marital status: Married    Spouse name: [place]   Number of children: 0   Years of education: 12   Highest education level: Bachelor's degree (e.g., BA, AB, BS)  Occupational History   Occupation: Retired in 2004  Tobacco Use   Smoking status: Former    Packs/day: 0.25    Years: 10.00    Total pack years: 2.50    Types: Cigarettes    Quit date: 2012    Years since quitting: 11.5   Smokeless tobacco: Never  Vaping Use   Vaping Use: Never used  Substance and Sexual Activity   Alcohol use: No   Drug use: Never   Sexual activity: Yes    Birth control/protection: Post-menopausal  Other Topics Concern   Not on file  Social History Narrative   Lives with husband.  Ambulated independently.   Right-handed.   No daily caffeine use.   Social Determinants of Health   Financial Resource Strain: Low Risk  (04/10/2021)   Overall Financial Resource Strain (CARDIA)    Difficulty of Paying Living Expenses: Not very hard  Recent Concern: Financial Resource Strain - Medium Risk (01/19/2021)   Overall Financial Resource Strain (CARDIA)    Difficulty of Paying Living Expenses: Somewhat hard  Food Insecurity: No Food Insecurity (04/10/2021)   Hunger Vital Sign    Worried About Running Out of Food in the Last Year: Never true    Ran Out of Food in the Last Year: Never true  Transportation Needs: No Transportation Needs (08/06/2021)   PRAPARE - Hydrologist (Medical): No    Lack of Transportation (Non-Medical): No  Physical Activity: Insufficiently Active (04/10/2021)   Exercise Vital Sign    Days of Exercise per Week: 1 day    Minutes of  Exercise per Session: 10 min  Stress: No Stress Concern Present (04/10/2021)   [place]    Feeling of Stress : Not at all  Social Connections: Moderately Integrated (04/10/2021)   Social Connection and Isolation Panel [NHANES]    Frequency of Communication with Friends and Family: More than three times a week    Frequency of Social Gatherings with Friends and Family: Once a week    Attends Religious Services: More than 4 times per year    Active Member of [employer] or Organizations: No    Attends Archivist Meetings: Not on file    Marital Status: Married  Human resources officer Violence: Not At Risk (05/26/2020)   Humiliation, Afraid, Rape, and Kick questionnaire    Fear of Current or Ex-Partner: No    Emotionally Abused: No    Physically Abused: No    Sexually Abused: No    Review of Systems  All other systems reviewed and are negative.   PHYSICAL EXAMINATION:    BP 112/78   Pulse 74   SpO2 96%     General appearance: alert, cooperative and appears stated age  Pelvic: External genitalia:  no lesions              Urethra:  normal appearing urethra with no masses, tenderness or lesions  Bartholins and Skenes: normal                 Vagina: normal appearing vagina with normal color and discharge, no lesions              Cervix: no lesions                Bimanual Exam:  Uterus:  normal size, contour, position, consistency, mobility, non-tender              Adnexa: no mass, fullness, tenderness         Pelvic US today: Uterus 7.11 x 3.60 x 3.61 cm.   No myometrial mass.  EMS 3.22 mm.  Fluid filled uterine cavity.  No masses.  Left ovary 4.28 x 3.19 cm.  Cyst 2.90 cm, simple with low level echoes.  Multicystic ovary. Right ovary 4.05 x 2.48 x 2.25 cm.  Multicystic ovary.  Right hydrosalpinx 4.3 x 0.8 cm.  No free fluid.   Attempted endometrial biopsy.  Consent done.  Speculum exam uncomfortable  for the patient.  After the prep of the cervix with betadine, the recommended procedure was abandoned.  Chaperone was present for exam:  Lovena Le, CMA and then Sharee Pimple, RN  ASSESSMENT  Recurrent postmenopausal bleeding.  Fluid filled endometrial cavity.  Hx cervical stenosis.  Multicystic ovaries. Right hydrosalpinx.   Status post hysteroscopy with dilation and curettage with US guidance.  Pathology was inactive endometrium and fragments of benign endometrial polyp.   Hx left breast cancer.  Off Tamoxifen.   CHF.  Cardiomyopathy.  Aortic stenosis.  Atrial fibrillation.  On Eliquis. Hx stroke. COPD. Hx ITP and low platelets.  Renal disease.  Elevated creatinine.  Hx HSV.   PLAN  We discussed her pelvic US report and reviewed the images.   Check CA125 today.   Discussion of ultrasound guided hysteroscopy with possible Myosure, dilation and curettage.  Risks, benefits, and alternatives reviewed. Risks include but are not limited to bleeding, infection, damage to surrounding organs including uterine perforation, reaction to anesthesia, DVT, PE, death, need for further treatment. Surgical expectations and recovery discussed.  Patient wishes to proceed.  Will determine plan for her ovaries after the CA125 is reviewed.    An After Visit Summary was printed and given to the patient.  30 min  total time was spent for this patient encounter, including preparation, face-to-face counseling with the patient, coordination of care, and documentation of the encounter.

## 2021-09-17 ENCOUNTER — Encounter: Payer: Self-pay | Admitting: Obstetrics and Gynecology

## 2021-09-17 ENCOUNTER — Ambulatory Visit: Payer: Medicare PPO

## 2021-09-17 ENCOUNTER — Ambulatory Visit (INDEPENDENT_AMBULATORY_CARE_PROVIDER_SITE_OTHER): Payer: Medicare PPO | Admitting: Obstetrics and Gynecology

## 2021-09-17 VITALS — BP 112/78 | HR 74

## 2021-09-17 DIAGNOSIS — N950 Postmenopausal bleeding: Secondary | ICD-10-CM | POA: Diagnosis not present

## 2021-09-17 DIAGNOSIS — N859 Noninflammatory disorder of uterus, unspecified: Secondary | ICD-10-CM | POA: Diagnosis not present

## 2021-09-17 DIAGNOSIS — Z170 Estrogen receptor positive status [ER+]: Secondary | ICD-10-CM | POA: Diagnosis not present

## 2021-09-17 DIAGNOSIS — N7011 Chronic salpingitis: Secondary | ICD-10-CM

## 2021-09-17 DIAGNOSIS — N83202 Unspecified ovarian cyst, left side: Secondary | ICD-10-CM | POA: Diagnosis not present

## 2021-09-17 DIAGNOSIS — N83201 Unspecified ovarian cyst, right side: Secondary | ICD-10-CM

## 2021-09-17 DIAGNOSIS — C50412 Malignant neoplasm of upper-outer quadrant of left female breast: Secondary | ICD-10-CM | POA: Diagnosis not present

## 2021-09-18 LAB — CA 125: CA 125: 16 U/mL (ref <35)

## 2021-09-22 ENCOUNTER — Telehealth: Payer: Self-pay | Admitting: *Deleted

## 2021-09-22 NOTE — Telephone Encounter (Signed)
Patient called regarding lab results for CA-125. Patient asked what is the next step based on lab result? Please advise

## 2021-09-23 NOTE — Telephone Encounter (Signed)
Patient informed, aware to expect a call from [place], [place]

## 2021-09-23 NOTE — Telephone Encounter (Signed)
Please let patient know that her CA125 is normal.  I went back and reviewed her prior pelvic ultrasound, and the images are somewhat similar with respect to her ovaries and tubes.   At this point, I would recommend the ultrasound guided hysteroscopy with dilation and curettage at [facility].  Time needed is one hour.   I would also recommend a follow up ultrasound in 2 months.  Please schedule this.   Her diagnosis is postmenopausal bleeding, fluid in the endometrial canal, and cervical stenosis.

## 2021-09-25 ENCOUNTER — Telehealth: Payer: Self-pay | Admitting: Internal Medicine

## 2021-09-25 ENCOUNTER — Telehealth: Payer: Self-pay | Admitting: *Deleted

## 2021-09-25 NOTE — Telephone Encounter (Signed)
   Pre-operative Risk Assessment    Patient Name: Danielle [name]  DOB: Nov 04, 1945 MRN: [id number]      Request for Surgical Clearance    Procedure:  hysteroscopy dc ultrasound guided  Date of Surgery:  Clearance TBD                                 Surgeon:  Dr. Eula Listen Surgeon's Group or Practice Name:  [facility] Phone number:  [phone] Fax number:  [phone]   Type of Clearance Requested:   - Medical  - Pharmacy:  Hold Apixaban (Eliquis) needs cardiology recommendations   Type of Anesthesia:  General    Additional requests/questions:    SignedAnnabell Sabal   09/25/2021, 10:23 AM

## 2021-09-25 NOTE — Telephone Encounter (Signed)
I agree with Danielle [name]'s recommendations.  Ideally, apixaban should be held for 1 day prior to surgery.  If this is unacceptable from a bleeding risk standpoint, I would hold apixaban for 2 days before the surgery and defer bridging with enoxaparin.  Though her CHA2DS2-VASc score is high (8), she has not been noted to be in atrial fibrillation at office visits for quite some time.  I suspect her atrial fibrillation burden is fairly low and that the risk of bleeding with enoxaparin bridging would likely outweigh any benefits.  Apixaban should be restarted as soon as possible after hysteroscopy.  Danielle Bush, MD [facility]

## 2021-09-25 NOTE — Telephone Encounter (Signed)
Telephone encounter previously closed.   See telephone encounter dated 09/25/21.

## 2021-09-25 NOTE — Telephone Encounter (Signed)
Nunzio Cobbs, MD routed conversation to You; [facility] Triage; [facility] 2 days ago   Yisroel Ramming, Everardo All, MD 2 days ago    Please let patient know that her CA125 is normal.  I went back and reviewed her prior pelvic ultrasound, and the images are somewhat similar with respect to her ovaries and tubes.    At this point, I would recommend the ultrasound guided hysteroscopy with dilation and curettage at [facility].  Time needed is one hour.    I would also recommend a follow up ultrasound in 2 months.  Please schedule this.    Her diagnosis is postmenopausal bleeding, fluid in the endometrial canal, and cervical stenosis.       Note    Thamas Jaegers, RMA routed conversation to Yisroel Ramming, Everardo All, MD 3 days ago

## 2021-09-25 NOTE — Telephone Encounter (Signed)
Patient with diagnosis of afib on Eliquis for anticoagulation.    Procedure: hysteroscopy dc ultrasound guided Date of procedure: TBD  CHA2DS2-VASc Score = 8  This indicates a 10.8% annual risk of stroke. The patient's score is based upon: CHF History: 1 HTN History: 1 Diabetes History: 0 Stroke History: 2 Vascular Disease History: 1 Age Score: 2 Gender Score: 1   CrCl 69m/min using adjusted body weight due to obesity Platelet count 70K  If procedure is just hysteroscopy would see if surgeon is ok with 1 day Eliquis hold. If there's a potential for biopsy, may need longer 2 day hold. Would forward to cardiologist in that case to make sure they'd be ok with 2 day hold given pt's elevated CV risk (has previously been bridged with Lovenox for procedures when she used to take warfarin before changing to Eliquis). She does have hx of chronic thrombocytopenia as well.  **This guidance is not considered finalized until pre-operative APP has relayed final recommendations.**

## 2021-09-25 NOTE — Telephone Encounter (Signed)
Spoke with patient. Reviewed surgery dates.  Patient will need to coordinate transportation and return call to confirm surgery date that she would like to proceed with. Questions answered.    Call placed to Cardiology/ Dr. Saunders Revel. Cardiac surgical and medication clearance requested. Patient is on Eliquis.

## 2021-09-28 ENCOUNTER — Other Ambulatory Visit (HOSPITAL_COMMUNITY): Payer: Self-pay | Admitting: Obstetrics and Gynecology

## 2021-09-28 ENCOUNTER — Other Ambulatory Visit: Payer: Self-pay | Admitting: Obstetrics and Gynecology

## 2021-09-28 ENCOUNTER — Telehealth: Payer: Self-pay | Admitting: *Deleted

## 2021-09-28 DIAGNOSIS — N950 Postmenopausal bleeding: Secondary | ICD-10-CM

## 2021-09-28 NOTE — Telephone Encounter (Signed)
Pt agreeable to plan of care for tele pre op appt 10/06/21 @ 9 am. Med rec and consent are done.

## 2021-09-28 NOTE — Telephone Encounter (Signed)
Patient returned call, request to proceed with surgery on 10/19/21. States she has a telephone visit with Cardiology on 10/06/21. Advised patient I will return call once surgery date/time confirmed. Patient agreeable.   Routing to [employer] for benefits

## 2021-09-28 NOTE — Telephone Encounter (Signed)
Pt agreeable to plan of care for tele pre op appt 10/06/21 @ 9 am. Med rec and consent are done.     Patient Consent for Virtual Visit        Danielle [name] has provided verbal consent on 09/28/2021 for a virtual visit (video or telephone).   CONSENT FOR VIRTUAL VISIT FOR:  Danielle [name]  By participating in this virtual visit I agree to the following:  I hereby voluntarily request, consent and authorize [facility] and its employed or contracted physicians, physician assistants, nurse practitioners or other licensed health care professionals (the Practitioner), to provide me with telemedicine health care services (the "Services") as deemed necessary by the treating Practitioner. I acknowledge and consent to receive the Services by the Practitioner via telemedicine. I understand that the telemedicine visit will involve communicating with the Practitioner through live audiovisual communication technology and the disclosure of certain medical information by electronic transmission. I acknowledge that I have been given the opportunity to request an in-person assessment or other available alternative prior to the telemedicine visit and am voluntarily participating in the telemedicine visit.  I understand that I have the right to withhold or withdraw my consent to the use of telemedicine in the course of my care at any time, without affecting my right to future care or treatment, and that the Practitioner or I may terminate the telemedicine visit at any time. I understand that I have the right to inspect all information obtained and/or recorded in the course of the telemedicine visit and may receive copies of available information for a reasonable fee.  I understand that some of the potential risks of receiving the Services via telemedicine include:  Delay or interruption in medical evaluation due to technological equipment failure or disruption; Information transmitted may  not be sufficient (e.g. poor resolution of images) to allow for appropriate medical decision making by the Practitioner; and/or  In rare instances, security protocols could fail, causing a breach of personal health information.  Furthermore, I acknowledge that it is my responsibility to provide information about my medical history, conditions and care that is complete and accurate to the best of my ability. I acknowledge that Practitioner's advice, recommendations, and/or decision may be based on factors not within their control, such as incomplete or inaccurate data provided by me or distortions of diagnostic images or specimens that may result from electronic transmissions. I understand that the practice of medicine is not an exact science and that Practitioner makes no warranties or guarantees regarding treatment outcomes. I acknowledge that a copy of this consent can be made available to me via my patient portal ([facility]), or I can request a printed copy by calling the office of [facility].    I understand that my insurance will be billed for this visit.   I have read or had this consent read to me. I understand the contents of this consent, which adequately explains the benefits and risks of the Services being provided via telemedicine.  I have been provided ample opportunity to ask questions regarding this consent and the Services and have had my questions answered to my satisfaction. I give my informed consent for the services to be provided through the use of telemedicine in my medical care

## 2021-09-28 NOTE — Telephone Encounter (Signed)
Dr.End has addressed anticoagulation.  See note from 09/25/2021.  Preoperative team, please contact this patient and set up a phone call appointment for further cardiac evaluation.  Thank you for your help.  Jossie Ng. Norris Bodley NP-C    09/28/2021, 2:06 PM [place] [address] Office [phone] Fax [phone]

## 2021-10-01 NOTE — Telephone Encounter (Signed)
Patient will need to return for a preop visit with me after she has seen cardiology.

## 2021-10-01 NOTE — Telephone Encounter (Signed)
Spoke with patient regarding surgery benefits. Patient acknowledges understanding of information presented. Patient is aware that benefits presented are professional benefits only. Patient is aware the hospital will call with facility benefits. See account note. Front desk aware of payment.

## 2021-10-01 NOTE — Telephone Encounter (Signed)
Dr. [name] -patient last seen in office on 09/17/21.  Surgery scheduled for 10/19/21, will patient need to return for pre-op visit?

## 2021-10-02 NOTE — Telephone Encounter (Signed)
Spoke with patient. Patient is requesting to cancel surgery at this time due to out of pocket cost, does not want to discuss payment plan. Declines to reschedule at this time. Declines to schedule f/u PUS at this time.   Advised I will provide update to Dr. [name] and return call if any additional recommendations. Patient appreciative of call.   Call placed to central scheduling, surgery cancelled for 10/19/21.   Routing to Dr. [name]

## 2021-10-05 NOTE — Progress Notes (Unsigned)
Virtual Visit via Telephone Note   Because of Danielle [name]'s co-morbid illnesses, she is at least at moderate risk for complications without adequate follow up.  This format is felt to be most appropriate for this patient at this time.  The patient did not have access to video technology/had technical difficulties with video requiring transitioning to audio format only (telephone).  All issues noted in this document were discussed and addressed.  No physical exam could be performed with this format.  Please refer to the patient's chart for her consent to telehealth for [facility].  Evaluation Performed:  Preoperative cardiovascular risk assessment _____________   Date:  10/05/2021   Patient ID:  Danielle [name], DOB 19-Sep-1945, MRN [id number] Patient Location:  Home Provider location:   Office  Primary Care Provider:  [name], Danielle [name] Primary Cardiologist:  Danielle [name]  Chief Complaint / Patient Profile   76 y.o. y/o female with a h/o aortic insufficiency s/p bioprosthetic aortic valve, mild nonobstructive CAD, chronic systolic heart failure, paroxysmal atrial fibrillation on chronic anticoagulation with NYHA Class III symptoms, CVA, hypertension who is pending hysteroscopy and presents today for telephonic preoperative cardiovascular risk assessment.  Contacted patient as scheduled on 10/06/21 for preoperative cardiac evaluation and she reports she has cancelled the procedure. Will make this a no charge visit.   Past Medical History    Past Medical History:  Diagnosis Date   Anticoagulated on Coumadin    managed by cardiology   Anxiety    Chronic constipation    Chronic systolic CHF (congestive heart failure) ([place]) 2016   cardiologist--- dr end and followed by CHF clinic   CKD (chronic kidney disease), stage III ([place])    COPD (chronic obstructive pulmonary disease) ([place])    previous had seen pulmonology --- dr Melvyn Novas, note in epic  03-14-2016, dx COPD GOLD 0   Coronary artery disease    cardiologist--- dr Durward Mallard;  last cardiac cath 01-10-20202 mild nonobstructive disease proxLAD;  per pt, had LHC prior to AVR in [place] 2016   DOE (dyspnea on exertion)    GERD (gastroesophageal reflux disease)    History of 2019 novel coronavirus disease (COVID-19) 12/19/2019   hospital admission w/ covid pneumonia , no intubatiion,  per pt symptoms resolved and back to baseline   History of cerebrovascular accident (CVA) with residual deficit    CVA in 2000 without residuals;  CVA post op AVR 01/ 2016 with residual right hand weakness   History of gastritis    History of pneumothorax    s/p right vats 11-19-2014 and  s/p left vats  12-13-2015   (both spontenaous due to bleb)   Hyperlipidemia    Hypertension    Hyperthyroidism    per pt followed by pcp,  dx approx 2014   Malignant neoplasm of upper-outer quadrant of left breast in female, estrogen receptor positive ([place]) 11/2016   oncologist-- dr Burr Medico--- dx 10/ 2018  ,  s/p left lumptectomy with node dissection;  completed radiation 04-15-2017, no chemo   Multiple thyroid nodules    in care everywhere pt had left thyroid nodule biospy 09-18-2014 benign   NICM (nonischemic cardiomyopathy) ([place]) 2016   2016  ef 30%;  last echo in epic ef 45%   OA (osteoarthritis)    Osteoporosis    Paroxysmal atrial fibrillation [facility])    cardiologist--- dr end   Personal history of radiation therapy    left breast cancer  03-08-2017  to 04-05-2017   Thrombocytopenia ([place])  followed by dr Burr Medico   Valvular stenosis, aortic 03/12/2014   Status post bioprosthetic AVR  in [place]  for severe AS   Wears dentures    full upper   Past Surgical History:  Procedure Laterality Date   AORTIC VALVE REPLACEMENT  03/12/2014   [facility] [place];  St Jude 63m, medial Trifecta Bioprosthesis   BREAST LUMPECTOMY WITH RADIOACTIVE SEED AND SENTINEL LYMPH NODE BIOPSY Left 01/14/2017   Procedure:  LEFT BREAST LUMPECTOMY WITH RADIOACTIVE SEED AND SENTINEL LYMPH NODE BIOPSY;  Surgeon: WRolm Bookbinder [name];  [facility]  Service: General;  Laterality: Left;   CARDIAC CATHETERIZATION  02/12/2014   [facility] [place] N/A 11/17/2020   Procedure: DILATATION AND CURETTAGE /HYSTEROSCOPY WITH MYOSURE;  Surgeon: ANunzio Cobbs [name];  [facility]  Service: Gynecology;  Laterality: N/A;   INTRAMEDULLARY (IM) NAIL INTERTROCHANTERIC Left 12/10/2015   Procedure: INTRAMEDULLARY (IM) NAIL INTERTROCHANTRIC;  Surgeon: NLeandrew Koyanagi [name];  [place] [facility]  Service: Orthopedics;  Laterality: Left;   OPERATIVE ULTRASOUND N/A 11/17/2020   Procedure: OPERATIVE ULTRASOUND;  Surgeon: ANunzio Cobbs [name];  [facility]  Service: Gynecology;  Laterality: N/A;   PLEURADESIS Left 12/03/2015   Procedure: PLEURADESIS;  Surgeon: SMelrose Nakayama [name];  [place] [facility]  Service: Thoracic;  Laterality: Left;   RESECTION OF APICAL BLEB Left 12/03/2015   Procedure: BLEBECTOMY;  Surgeon: SMelrose Nakayama [name];  [place] [facility]  Service: Thoracic;  Laterality: Left;   RIGHT/LEFT HEART CATH AND CORONARY ANGIOGRAPHY N/A 03/10/2020   Procedure: RIGHT/LEFT HEART CATH AND CORONARY ANGIOGRAPHY;  Surgeon: ENelva Bush [name];  [facility];  Service: Cardiovascular;  Laterality: N/A;   VIDEO ASSISTED THORACOSCOPY Left 12/03/2015   Procedure: VIDEO ASSISTED THORACOSCOPY;  Surgeon: SMelrose Nakayama [name];  [facility];  Service: Thoracic;  Laterality: Left;   VIDEO ASSISTED THORACOSCOPY (VATS) W/TALC PLEUADESIS Right [place]19/2014   [place]    Allergies  Allergies  Allergen Reactions   Lisinopril Cough   Tetanus Toxoid Adsorbed Swelling    Arm swelling   Other Cough    PEANUTS   Peanut Allergen Powder-Dnfp Cough    History of Present Illness    GSaniyyah Elsteris a 76y.o.  female who presents via audio/video conferencing for a telehealth visit today.  Pt was last seen in cardiology clinic on 07/14/21 by Dr. MAundra Dubin  At that time GDavonda Ausleywas doing well.  The patient is now pending procedure as outlined above. Since her last visit, she    Home Medications    Prior to Admission medications   Medication Sig Start Date End Date Taking? Authorizing Provider  albuterol (PROVENTIL HFA;VENTOLIN HFA) 108 (90 Base) MCG/ACT inhaler Inhale 2 puffs into the lungs every 6 (six) hours as needed for wheezing or shortness of breath. 12/11/14   Provider, Historical, [name]  alendronate (FOSAMAX) 70 MG tablet Take 1 tablet (70 mg total) by mouth once a week. Take with a full glass of water on an empty stomach. 07/24/19   J[place] Danielle [name]  apixaban (ELIQUIS) 5 MG TABS tablet Take 1 tablet (5 mg total) by mouth 2 (two) times daily. 03/26/21   MLarey Dresser [name]  ascorbic acid (VITAMIN C) 500 MG tablet Take 500 mg by mouth daily.    Provider, Historical, [name]  budesonide-formoterol (SYMBICORT) 160-4.5 MCG/ACT inhaler Inhale  2 puffs into the lungs daily as needed. 11/25/20   Danielle [name]  carvedilol (COREG) 25 MG tablet Take 1 tablet (25 mg total) by mouth 2 (two) times daily with a meal. 07/14/21   Larey Dresser, [name]  Cholecalciferol 25 MCG (1000 UT) tablet Take 1,000 Units by mouth daily.    Provider, Historical, [name]  fluconazole (DIFLUCAN) 150 MG tablet Take 150 mg by mouth once. 07/08/21   Provider, Historical, [name]  fluticasone (FLONASE) 50 MCG/ACT nasal spray Place 1 spray into both nostrils daily. 01/30/20   Danielle [name]  methimazole (TAPAZOLE) 5 MG tablet Take 3 tablets (15 mg total) by mouth daily. 03/31/21   Danielle [name]  Multiple Vitamin (MULTIVITAMIN) tablet Take 1 tablet by mouth daily.    Provider, Historical, [name]  pantoprazole (PROTONIX) 40 MG tablet Take 1 tablet (40 mg total) by mouth daily. 10/06/20   Larey Dresser, [name]  rosuvastatin  (CRESTOR) 40 MG tablet Take 1 tablet (40 mg total) by mouth daily. 03/26/21   Larey Dresser, [name]  sacubitril-valsartan (ENTRESTO) 49-51 MG Take 1 tablet by mouth 2 (two) times daily. 03/13/21   [place], Maricela Bo, FNP  senna-docusate (SENOKOT-S) 8.6-50 MG tablet Take 2 tablets by mouth at bedtime.    Provider, Historical, [name]  spironolactone (ALDACTONE) 25 MG tablet Take 1 tablet (25 mg total) by mouth daily. 03/26/21   Larey Dresser, [name]  torsemide (DEMADEX) 20 MG tablet Take 1 tablet (20 mg total) by mouth as needed (weight gain / swelling). 05/29/21   End, Harrell Gave, [name]  traMADol (ULTRAM) 50 MG tablet Take 1 tablet (50 mg total) by mouth 3 (three) times daily. 07/28/21   Kirsteins, Luanna Salk, [name]  trolamine salicylate (ASPERCREME) 10 % cream Apply 1 application topically as needed for muscle pain.    Provider, Historical, [name]  valACYclovir (VALTREX) 500 MG tablet Take on tablet by mouth twice a day for 3 days as needed for an outbreak 11/10/20   Yisroel Ramming, Everardo All, [name]  zolpidem (AMBIEN) 5 MG tablet Take 0.5-1 tablets (2.5-5 mg total) by mouth at bedtime as needed for sleep. 04/13/21   Danielle [name]    Physical Exam    Vital Signs:  Danielle [name] does not have vital signs available for review today.  Given telephonic nature of communication, physical exam is limited. AAOx3. NAD. Normal affect.  Speech and respirations are unlabored.  Accessory Clinical Findings    None  Assessment & Plan    1.  Preoperative Cardiovascular Risk Assessment:  I agree with Megan's recommendations.  Ideally, apixaban should be held for 1 day prior to surgery.  If this is unacceptable from a bleeding risk standpoint, I would hold apixaban for 2 days before the surgery and defer bridging with enoxaparin.  Though her CHA2DS2-VASc score is high (8), she has not been noted to be in atrial fibrillation at office visits for quite some time.  I suspect her atrial fibrillation burden is fairly low  and that the risk of bleeding with enoxaparin bridging would likely outweigh any benefits.  Apixaban should be restarted as soon as possible after hysteroscopy. Danielle [name] [facility]   A copy of this note will be routed to requesting surgeon.  Time:   Today, I have spent  minutes with the patient with telehealth technology discussing medical history, symptoms, and management plan.     Emmaline Life, NP-C    10/05/2021, 12:33 PM  [facility] [address] Office [phone] Fax [phone]

## 2021-10-06 ENCOUNTER — Ambulatory Visit (INDEPENDENT_AMBULATORY_CARE_PROVIDER_SITE_OTHER): Payer: Medicare PPO | Admitting: Nurse Practitioner

## 2021-10-06 DIAGNOSIS — Z01810 Encounter for preprocedural cardiovascular examination: Secondary | ICD-10-CM

## 2021-10-07 NOTE — Telephone Encounter (Signed)
Patient will need a letter send recommending endometrial evaluation with office biopsy or with ultrasound guided hysteroscopy for evaluation of recurrent postmenopausal bleeding.   Postmenopausal bleeding may be caused by multiple conditions including precancerous cells and cancer.   Delay in care may result in poor outcome.   Please review letter with me that we have on file recommending evaluation for postmenopausal bleeding.   The letter also needs to recommend a follow up ultrasound in 6 weeks for evaluation of the ovarian cysts.

## 2021-10-07 NOTE — Telephone Encounter (Signed)
Letter pended. Copy to Dr. [name] to review.

## 2021-10-08 NOTE — Telephone Encounter (Signed)
Letter reviewed and signed by Dr. Silva.  Letter mailed to address on file.   Encounter closed.  

## 2021-10-13 ENCOUNTER — Encounter (HOSPITAL_COMMUNITY): Payer: Self-pay

## 2021-10-13 ENCOUNTER — Ambulatory Visit (HOSPITAL_COMMUNITY)
Admission: RE | Admit: 2021-10-13 | Discharge: 2021-10-13 | Disposition: A | Discharge status: Home / Self Care | Payer: Medicare PPO | Source: Ambulatory Visit | Attending: Family Medicine | Admitting: Family Medicine

## 2021-10-13 VITALS — BP 100/78 | HR 65 | Wt 232.8 lb

## 2021-10-13 DIAGNOSIS — Z79899 Other long term (current) drug therapy: Secondary | ICD-10-CM | POA: Insufficient documentation

## 2021-10-13 DIAGNOSIS — I2510 Atherosclerotic heart disease of native coronary artery without angina pectoris: Secondary | ICD-10-CM | POA: Diagnosis not present

## 2021-10-13 DIAGNOSIS — R0602 Shortness of breath: Secondary | ICD-10-CM | POA: Diagnosis not present

## 2021-10-13 DIAGNOSIS — Z6836 Body mass index (BMI) 36.0-36.9, adult: Secondary | ICD-10-CM | POA: Insufficient documentation

## 2021-10-13 DIAGNOSIS — I110 Hypertensive heart disease with heart failure: Secondary | ICD-10-CM | POA: Diagnosis not present

## 2021-10-13 DIAGNOSIS — I252 Old myocardial infarction: Secondary | ICD-10-CM | POA: Insufficient documentation

## 2021-10-13 DIAGNOSIS — Z86718 Personal history of other venous thrombosis and embolism: Secondary | ICD-10-CM | POA: Diagnosis not present

## 2021-10-13 DIAGNOSIS — M25519 Pain in unspecified shoulder: Secondary | ICD-10-CM | POA: Insufficient documentation

## 2021-10-13 DIAGNOSIS — E0590 Thyrotoxicosis, unspecified without thyrotoxic crisis or storm: Secondary | ICD-10-CM | POA: Insufficient documentation

## 2021-10-13 DIAGNOSIS — I10 Essential (primary) hypertension: Secondary | ICD-10-CM | POA: Diagnosis not present

## 2021-10-13 DIAGNOSIS — I428 Other cardiomyopathies: Secondary | ICD-10-CM | POA: Diagnosis not present

## 2021-10-13 DIAGNOSIS — I351 Nonrheumatic aortic (valve) insufficiency: Secondary | ICD-10-CM | POA: Diagnosis not present

## 2021-10-13 DIAGNOSIS — I5022 Chronic systolic (congestive) heart failure: Secondary | ICD-10-CM | POA: Diagnosis not present

## 2021-10-13 DIAGNOSIS — Z7901 Long term (current) use of anticoagulants: Secondary | ICD-10-CM | POA: Diagnosis not present

## 2021-10-13 DIAGNOSIS — Z952 Presence of prosthetic heart valve: Secondary | ICD-10-CM

## 2021-10-13 DIAGNOSIS — E669 Obesity, unspecified: Secondary | ICD-10-CM | POA: Insufficient documentation

## 2021-10-13 DIAGNOSIS — I480 Paroxysmal atrial fibrillation: Secondary | ICD-10-CM

## 2021-10-13 DIAGNOSIS — Z953 Presence of xenogenic heart valve: Secondary | ICD-10-CM | POA: Insufficient documentation

## 2021-10-13 DIAGNOSIS — R002 Palpitations: Secondary | ICD-10-CM | POA: Insufficient documentation

## 2021-10-13 LAB — BASIC METABOLIC PANEL
Anion gap: 5 (ref 5–15)
BUN: 14 mg/dL (ref 8–23)
CO2: 23 mmol/L (ref 22–32)
Calcium: 10.3 mg/dL (ref 8.9–10.3)
Chloride: 110 mmol/L (ref 98–111)
Creatinine, Ser: 1.52 mg/dL — ABNORMAL HIGH (ref 0.44–1.00)
GFR, Estimated: 36 mL/min — ABNORMAL LOW (ref >60)
Glucose, Bld: 111 mg/dL — ABNORMAL HIGH (ref 70–99)
Potassium: 4.6 mmol/L (ref 3.5–5.1)
Sodium: 138 mmol/L (ref 135–145)

## 2021-10-13 NOTE — Patient Instructions (Signed)
Thank you for coming in today  Labs were done today, if any labs are abnormal the clinic will call you No news is good news  Your physician recommends that you schedule a follow-up appointment in: 3-4 months with Dr. Aundra Dubin with echocardiogram  Your physician has requested that you have an echocardiogram. Echocardiography is a painless test that uses sound waves to create images of your heart. It provides your doctor with information about the size and shape of your heart and how well your heart's chambers and valves are working. This procedure takes approximately one hour. There are no restrictions for this procedure.     Do the following things EVERYDAY: Weigh yourself in the morning before breakfast. Write it down and keep it in a log. Take your medicines as prescribed Eat low salt foods--Limit salt (sodium) to 2000 mg per day.  Stay as active as you can everyday Limit all fluids for the day to less than 2 liters  At the [facility], you and your health needs are our priority. As part of our continuing mission to provide you with exceptional heart care, we have created designated Provider Care Teams. These Care Teams include your primary Cardiologist (physician) and Advanced Practice Providers (APPs- Physician Assistants and Nurse Practitioners) who all work together to provide you with the care you need, when you need it.   You may see any of the following providers on your designated Care Team at your next follow up: Dr Glori Bickers Dr Haynes Kerns, NP Lyda Jester, [place] [facility] [place], [place] Audry Riles, PharmD   Please be sure to bring in all your medications bottles to every appointment.   If you have any questions or concerns before your next appointment please send [place] a message through [place] or call our office at [phone].    TO LEAVE A MESSAGE FOR THE NURSE SELECT OPTION 2, PLEASE LEAVE A MESSAGE INCLUDING: YOUR  NAME DATE OF BIRTH CALL BACK NUMBER REASON FOR CALL**this is important as we prioritize the call backs  YOU WILL RECEIVE A CALL BACK THE SAME DAY AS LONG AS YOU CALL BEFORE 4:00 PM

## 2021-10-13 NOTE — Progress Notes (Signed)
PCP: [name], [name], MD Cardiology: Dr. Saunders Revel HF cardiology: Dr. Aundra Dubin  75 y.o. with history of aortic insufficiency s/p bioprosthetic aortic valve and CHF was referred by Dr End for evaluation of CHF.  Patient had bioprosthetic aortic valve replacement for aortic insufficiency in 1/16 at [facility] in [place], [place].  Since that time, she has had mild to moderately reduced LV systolic function, as low as 30-35% and as high at 45-50%.  Most recent echo in 9/21 showed EF 45-50%.  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  She has had paroxysmal atrial fibrillation and 2 prior CVAs.  She is on warfarin.  She has had breast cancer treated with lumpectomy and radiation, no chemotherapy.  She has history of hyperthyroidism and is on methimazole.  She has controlled HTN.    Cardiac MRI (10/22) with LV EF 41%, normal RV, subendocardial LGE in the mid-apical inferior wall and the mid-apical inferolateral wall.   Echo in 12/22 was a very technically difficult study with poor images, EF around 35%, RV poorly visualized, the bioprosthetic aortic valve looked ok.    Follow up 5/23, weight down 17 lbs on GLP1RA.  Stable NYHA III symptoms, volume stable. Coreg increased to 25 mg bid.  Today she returns for HF follow up with her husband, he is newly diagnosed with Parkinson's. Overall feeling fine. Walks around the house with a walker for balance without difficulty. Right rotator cuff pain prevents her from walking further with her walker. Can walk 1/4 mile before getting SOB. Occasional palps, struggles with vertigo. Denies falls, abnormal bleeding, CP, or edema. Chronic 2 pillow orthopnea.  Appetite ok. No fever or chills. She is not weighing at home. Taking all medications. She takes torsemide every other week.   Labs (5/22): K 4.9, creatinine 1.21 Labs (9/22): K 4.9, creatinine 1.4 Labs (10/22): K 3.4, creatinine 1.8, TSH normal Labs (8/23): K 4.6, creatinine 1.83  ECG (personally  reviewed): none ordered today.  PMH: 1. Left breast cancer: Treated with lumpectomy and radiation, no chemotherapy.  2. Hyperthyroidism 3. Aortic insufficiency: Severe, found when she lived in [place].  She had bioprosthetic aortic valve replacement at [facility] in 1/16.  4. Atrial fibrillation: Paroxysmal. 5. CVA x 2 6. HTN 7. Hyperlipidemia 8. Chronic systolic CHF: Nonischemic cardiomyopathy.  - Echo (3/16): EF 45-50% - Echo (1/17): EF 30-35% - Echo (2/18): EF 35-40% - Echo (11/20): EF 35-40% - Echo (9/21): EF 45-50%, global hypokinesis, mild LVH, bioprosthetic aortic valve mean gradient 10 mmHg, normal RV - RHC/LHC (1/22): mean RA 9, PA 35/20, mean PCWP 21, CI 3.2.  Nonobstructive CAD.  - cMRI (10/22): LV EF 41%, normal RV, subendocardial LGE in the mid-apical inferior wall and the mid-apical inferolateral wall. - Echo (12/22): Very technically difficult study with poor images, EF around 35%, RV poorly visualized, the bioprosthetic aortic valve looked ok. 9. H/o spontaneous PTX  Social History   Socioeconomic History   Marital status: Married    Spouse name: Sherwood   Number of children: 0   Years of education: 12   Highest education level: Bachelor's degree (e.g., BA, AB, BS)  Occupational History   Occupation: Retired in 2004  Tobacco Use   Smoking status: Former    Packs/day: 0.25    Years: 10.00    Total pack years: 2.50    Types: Cigarettes    Quit date: 2012    Years since quitting: 11.6   Smokeless tobacco: Never  Vaping Use   Vaping  Use: Never used  Substance and Sexual Activity   Alcohol use: No   Drug use: Never   Sexual activity: Yes    Birth control/protection: Post-menopausal  Other Topics Concern   Not on file  Social History Narrative   Lives with husband.  Ambulated independently.   Right-handed.   No daily caffeine use.   Social Determinants of Health   Financial Resource Strain: Low Risk  (04/10/2021)   Overall Financial Resource  Strain (CARDIA)    Difficulty of Paying Living Expenses: Not very hard  Recent Concern: Financial Resource Strain - Medium Risk (01/19/2021)   Overall Financial Resource Strain (CARDIA)    Difficulty of Paying Living Expenses: Somewhat hard  Food Insecurity: No Food Insecurity (04/10/2021)   Hunger Vital Sign    Worried About Running Out of Food in the Last Year: Never true    Ran Out of Food in the Last Year: Never true  Transportation Needs: No Transportation Needs (08/06/2021)   PRAPARE - Hydrologist (Medical): No    Lack of Transportation (Non-Medical): No  Physical Activity: Insufficiently Active (04/10/2021)   Exercise Vital Sign    Days of Exercise per Week: 1 day    Minutes of Exercise per Session: 10 min  Stress: No Stress Concern Present (04/10/2021)   [place]    Feeling of Stress : Not at all  Social Connections: Moderately Integrated (04/10/2021)   Social Connection and Isolation Panel [NHANES]    Frequency of Communication with Friends and Family: More than three times a week    Frequency of Social Gatherings with Friends and Family: Once a week    Attends Religious Services: More than 4 times per year    Active Member of [employer] or Organizations: No    Attends Music therapist: Not on file    Marital Status: Married  Human resources officer Violence: Not At Risk (05/26/2020)   Humiliation, Afraid, Rape, and Kick questionnaire    Fear of Current or Ex-Partner: No    Emotionally Abused: No    Physically Abused: No    Sexually Abused: No   Family History  Problem Relation Age of Onset   Diabetes Mother    Heart attack Mother 42   Diabetes Father    Lung cancer Father    Diabetes Sister    Thyroid disease Sister    Diabetes Sister    HIV Brother    Colon polyps Neg Hx    Colon cancer Neg Hx    ROS: All systems reviewed and negative except as per HPI.   Current  Outpatient Medications  Medication Sig Dispense Refill   albuterol (PROVENTIL HFA;VENTOLIN HFA) 108 (90 Base) MCG/ACT inhaler Inhale 2 puffs into the lungs every 6 (six) hours as needed for wheezing or shortness of breath.     alendronate (FOSAMAX) 70 MG tablet Take 1 tablet (70 mg total) by mouth once a week. Take with a full glass of water on an empty stomach. 13 tablet 3   apixaban (ELIQUIS) 5 MG TABS tablet Take 1 tablet (5 mg total) by mouth 2 (two) times daily. 180 tablet 3   ascorbic acid (VITAMIN C) 500 MG tablet Take 500 mg by mouth daily.     budesonide-formoterol (SYMBICORT) 160-4.5 MCG/ACT inhaler Inhale 2 puffs into the lungs daily as needed. 3 each 3   carvedilol (COREG) 25 MG tablet Take 1 tablet (25 mg total) by mouth  2 (two) times daily with a meal. 180 tablet 3   Cholecalciferol 25 MCG (1000 UT) tablet Take 1,000 Units by mouth daily.     fluconazole (DIFLUCAN) 150 MG tablet Take 150 mg by mouth once.     fluticasone (FLONASE) 50 MCG/ACT nasal spray Place 1 spray into both nostrils daily. 16 g 3   methimazole (TAPAZOLE) 5 MG tablet Take 3 tablets (15 mg total) by mouth daily. 270 tablet 3   Multiple Vitamin (MULTIVITAMIN) tablet Take 1 tablet by mouth daily.     pantoprazole (PROTONIX) 40 MG tablet Take 1 tablet (40 mg total) by mouth daily. 30 tablet 11   rosuvastatin (CRESTOR) 40 MG tablet Take 1 tablet (40 mg total) by mouth daily. 90 tablet 3   sacubitril-valsartan (ENTRESTO) 49-51 MG Take 1 tablet by mouth 2 (two) times daily. 60 tablet 4   senna-docusate (SENOKOT-S) 8.6-50 MG tablet Take 2 tablets by mouth at bedtime.     spironolactone (ALDACTONE) 25 MG tablet Take 1 tablet (25 mg total) by mouth daily. 90 tablet 3   torsemide (DEMADEX) 20 MG tablet Take 1 tablet (20 mg total) by mouth as needed (weight gain / swelling).     traMADol (ULTRAM) 50 MG tablet Take 1 tablet (50 mg total) by mouth 3 (three) times daily. 90 tablet 5   trolamine salicylate (ASPERCREME) 10 % cream  Apply 1 application topically as needed for muscle pain.     valACYclovir (VALTREX) 500 MG tablet Take on tablet by mouth twice a day for 3 days as needed for an outbreak 30 tablet 0   zolpidem (AMBIEN) 5 MG tablet Take 0.5-1 tablets (2.5-5 mg total) by mouth at bedtime as needed for sleep. 15 tablet 0   No current facility-administered medications for this encounter.   Wt Readings from Last 3 Encounters:  10/13/21 105.6 kg (232 lb 12.8 oz)  07/28/21 106.6 kg (235 lb)  07/14/21 106.1 kg (234 lb)   BP 100/78   Pulse 65   Wt 105.6 kg (232 lb 12.8 oz)   SpO2 97%   BMI 36.46 kg/m  Physical Exam: General:  NAD. No resp difficulty, arrived in [facility] HEENT: Normal Neck: Supple. No JVD. Carotids 2+ bilat; no bruits. No lymphadenopathy or thryomegaly appreciated. Cor: PMI nondisplaced. Regular rate & rhythm. No rubs, gallops or murmurs. Lungs: Clear Abdomen: Obese, nontender, nondistended. No hepatosplenomegaly. No bruits or masses. Good bowel sounds. Extremities: No cyanosis, clubbing, rash, edema Neuro: Alert & oriented x 3, cranial nerves grossly intact. Moves all 4 extremities w/o difficulty. Affect pleasant.  Assessment/Plan: 1. Chronic HF with mid range EF: Nonischemic cardiomyopathy.  Echo in 9/21 showed EF 45-50%, global hypokinesis, mild LVH, bioprosthetic aortic valve mean gradient 10 mmHg, normal RV.  Cause of cardiomyopathy is uncertain, mild CAD on 1/22 cath.  It is possible that this is chronic effect of long-standing AI that was not improved with valve replacement.  Also consider long-standing HTN.  Cardiac MRI in 10/22 showed LV EF 41%, normal RV, subendocardial LGE in the mid-apical inferior wall and the mid-apical inferolateral wall. The LGE pattern does not look like cardiac amyloidosis.  The subendocardial LGE looks like it came from prior MI but minimal CAD on cath => ?prior embolism down a coronary related to atrial fibrillation. Echo in 12/22 was a very poor study, EF looks to  be around 35%.  She is limited by shoulder pain and physical deconditioning, but seems to be improved NYHA class II-early III.  She  is not volume overloaded on exam.  - She is using torsemide prn.   - Off Farxiga due to yeast infections.  - Continue Coreg 25 mg bid.   - Continue Entresto 49/51 mg bid. No BP room to increase today. - Continue spironolactone 25 mg daily. BMET today.  - Repeat echo next visit to look for improvement in EF. 2. Aortic valve disorders: Per report, had severe AI.  Now s/p bioprosthetic aortic valve replacement.  The valve functions normally on 12/22 echo. - Will need antibiotic prophylaxis with dental work.  - Repeat echo next visit as above. 3. HTN: BP controlled on current regimen.  4. Atrial fibrillation: Paroxysmal.  Regular on exam today, rare palpitations.   - Continue apixaban.  No bleeding issues 5. Obesity: Body mass index is 36.46 kg/m. - Weight down but unable to tolerate semaglutide.   Followup 3 months with Dr. Aundra Dubin + echo.  Maric[facility] C[facility] FNP-BC 10/13/2021

## 2021-10-16 ENCOUNTER — Telehealth: Payer: Self-pay

## 2021-10-16 NOTE — Telephone Encounter (Signed)
   Telephone encounter was:  Successful.  10/16/2021 Name: [name] MRN: [id number] DOB: 1945-07-15  [name] is a 76 y.o. year old female who is a primary care patient of [name], [name], MD . The community resource team was consulted for assistance with Transportation Needs   Care guide performed the following interventions: Patient provided with information about care guide support team and interviewed to confirm resource needs. Follow up call  Follow Up Plan:  No further follow up planned at this time. The patient has been provided with needed resources.    [place], Care Management  [phone] [address], [place], [place] [address] Phone: [phone] Email: [email or web address]

## 2021-10-19 ENCOUNTER — Ambulatory Visit (HOSPITAL_COMMUNITY): Payer: Medicare PPO

## 2021-10-19 ENCOUNTER — Ambulatory Visit (HOSPITAL_BASED_OUTPATIENT_CLINIC_OR_DEPARTMENT_OTHER): Admit: 2021-10-19 | Payer: Medicare PPO | Admitting: Obstetrics and Gynecology

## 2021-10-19 ENCOUNTER — Encounter (HOSPITAL_BASED_OUTPATIENT_CLINIC_OR_DEPARTMENT_OTHER): Payer: Self-pay

## 2021-10-19 SURGERY — DILATATION & CURETTAGE/HYSTEROSCOPY WITH MYOSURE
Anesthesia: Choice

## 2021-10-27 ENCOUNTER — Encounter: Payer: Medicare PPO | Attending: Physical Medicine & Rehabilitation | Admitting: Physical Medicine & Rehabilitation

## 2021-10-27 ENCOUNTER — Encounter: Payer: Self-pay | Admitting: Physical Medicine & Rehabilitation

## 2021-10-27 ENCOUNTER — Telehealth: Payer: Self-pay

## 2021-10-27 ENCOUNTER — Other Ambulatory Visit: Payer: Self-pay

## 2021-10-27 VITALS — BP 106/68 | HR 72 | Ht 67.0 in | Wt 228.0 lb

## 2021-10-27 DIAGNOSIS — Z79891 Long term (current) use of opiate analgesic: Secondary | ICD-10-CM | POA: Insufficient documentation

## 2021-10-27 DIAGNOSIS — G894 Chronic pain syndrome: Secondary | ICD-10-CM

## 2021-10-27 DIAGNOSIS — M1711 Unilateral primary osteoarthritis, right knee: Secondary | ICD-10-CM | POA: Diagnosis not present

## 2021-10-27 DIAGNOSIS — M159 Polyosteoarthritis, unspecified: Secondary | ICD-10-CM

## 2021-10-27 DIAGNOSIS — Z5181 Encounter for therapeutic drug level monitoring: Secondary | ICD-10-CM | POA: Insufficient documentation

## 2021-10-27 MED ORDER — LIDOCAINE HCL 1 % IJ SOLN
5.0000 mL | Freq: Once | INTRAMUSCULAR | Status: AC
  Administered 2021-10-27: 5 mL

## 2021-10-27 MED ORDER — HYLAN G-F 20 48 MG/6ML IX SOSY
48.0000 mg | PREFILLED_SYRINGE | Freq: Once | INTRA_ARTICULAR | Status: AC
  Administered 2021-10-27: 48 mg via INTRA_ARTICULAR

## 2021-10-27 NOTE — Telephone Encounter (Signed)
Called pharmacy and they have refills on Tramadol and will get it ready for her

## 2021-10-27 NOTE — Telephone Encounter (Signed)
Patient called to request a refill on Tramadol. She stated she forgot to ask during her appointment

## 2021-10-27 NOTE — Telephone Encounter (Signed)
Pt states she needs a knee brace ordered. She states the ones you get from the pharmacy don't work.

## 2021-10-27 NOTE — Addendum Note (Signed)
Addended by: Caro Hight on: 10/27/2021 03:04 PM   Modules accepted: Orders

## 2021-10-27 NOTE — Progress Notes (Addendum)
Indication end-stage osteoarthritis of the RIGHT knee with pain that limits mobility and does not respond to oral medications.  Ultrasound guidance, 15 Hz linear transducer, long axis view  Medial aspect of the knee was imaged, identified joint space, identified patella, femur, tibia. 25-gauge 1.5 inch needle was inserted under ultrasound guidance and 3 mL of 1% lidocaine were infiltrated into the skin and subcutaneous tissue. Then a 21-gauge, 2 inch needle was inserted along the same needle track Into the joint under direct ultrasound visualization.  6 mL of Synvisc-1 were injected. Patient tolerated procedure well Post procedure instructions given  Custom fit Right knee orthosis Rx faxed to [facility]

## 2021-10-27 NOTE — Patient Instructions (Signed)

## 2021-10-30 ENCOUNTER — Telehealth: Payer: Self-pay | Admitting: *Deleted

## 2021-10-30 LAB — DRUG TOX MONITOR 1 W/CONF, ORAL FLD

## 2021-10-30 NOTE — Telephone Encounter (Signed)
Oral swab drug screen was consistent for prescribed medications.  ?

## 2021-11-19 ENCOUNTER — Encounter: Payer: Self-pay | Admitting: Hematology

## 2021-11-19 ENCOUNTER — Inpatient Hospital Stay: Payer: Medicare PPO | Attending: Nurse Practitioner | Admitting: Hematology

## 2021-11-19 ENCOUNTER — Inpatient Hospital Stay: Payer: Medicare PPO

## 2021-11-19 ENCOUNTER — Other Ambulatory Visit: Payer: Self-pay

## 2021-11-19 VITALS — BP 122/77 | HR 67 | Temp 98.1°F | Resp 18 | Ht 67.0 in | Wt 223.0 lb

## 2021-11-19 DIAGNOSIS — D696 Thrombocytopenia, unspecified: Secondary | ICD-10-CM | POA: Diagnosis not present

## 2021-11-19 DIAGNOSIS — Z853 Personal history of malignant neoplasm of breast: Secondary | ICD-10-CM | POA: Diagnosis not present

## 2021-11-19 DIAGNOSIS — Z170 Estrogen receptor positive status [ER+]: Secondary | ICD-10-CM

## 2021-11-19 DIAGNOSIS — C50412 Malignant neoplasm of upper-outer quadrant of left female breast: Secondary | ICD-10-CM | POA: Diagnosis not present

## 2021-11-19 DIAGNOSIS — Z923 Personal history of irradiation: Secondary | ICD-10-CM | POA: Insufficient documentation

## 2021-11-19 LAB — CBC WITH DIFFERENTIAL (CANCER CENTER ONLY)
Abs Immature Granulocytes: 0 10*3/uL (ref 0.00–0.07)
Basophils Absolute: 0 10*3/uL (ref 0.0–0.1)
Basophils Relative: 1 %
Eosinophils Absolute: 0.4 10*3/uL (ref 0.0–0.5)
Eosinophils Relative: 8 %
HCT: 36.3 % (ref 36.0–46.0)
Hemoglobin: 12.2 g/dL (ref 12.0–15.0)
Immature Granulocytes: 0 %
Lymphocytes Relative: 27 %
Lymphs Abs: 1.3 10*3/uL (ref 0.7–4.0)
MCH: 32.2 pg (ref 26.0–34.0)
MCHC: 33.6 g/dL (ref 30.0–36.0)
MCV: 95.8 fL (ref 80.0–100.0)
Monocytes Absolute: 0.4 10*3/uL (ref 0.1–1.0)
Monocytes Relative: 9 %
Neutro Abs: 2.8 10*3/uL (ref 1.7–7.7)
Neutrophils Relative %: 55 %
Platelet Count: 58 10*3/uL — ABNORMAL LOW (ref 150–400)
RBC: 3.79 MIL/uL — ABNORMAL LOW (ref 3.87–5.11)
RDW: 13.4 % (ref 11.5–15.5)
WBC Count: 5 10*3/uL (ref 4.0–10.5)
nRBC: 0 % (ref 0.0–0.2)

## 2021-11-19 LAB — CMP (CANCER CENTER ONLY)
ALT: 8 U/L (ref 0–44)
AST: 12 U/L — ABNORMAL LOW (ref 15–41)
Albumin: 4.1 g/dL (ref 3.5–5.0)
Alkaline Phosphatase: 56 U/L (ref 38–126)
Anion gap: 5 (ref 5–15)
BUN: 19 mg/dL (ref 8–23)
CO2: 25 mmol/L (ref 22–32)
Calcium: 10.1 mg/dL (ref 8.9–10.3)
Chloride: 107 mmol/L (ref 98–111)
Creatinine: 1.67 mg/dL — ABNORMAL HIGH (ref 0.44–1.00)
GFR, Estimated: 32 mL/min — ABNORMAL LOW (ref >60)
Glucose, Bld: 95 mg/dL (ref 70–99)
Potassium: 4.9 mmol/L (ref 3.5–5.1)
Sodium: 137 mmol/L (ref 135–145)
Total Bilirubin: 0.6 mg/dL (ref 0.3–1.2)
Total Protein: 6.9 g/dL (ref 6.5–8.1)

## 2021-11-19 NOTE — Progress Notes (Addendum)
[place]   [phone] [phone]   Clinic Follow up Note   Patient Care Team: [name], [name], MD as PCP - General (Family Medicine) End, Harrell Gave, MD as PCP - Cardiology (Cardiology) [name], MD as Consulting Physician (Hematology) Kyung Rudd, MD as Consulting Physician (Radiation Oncology) Rolm Bookbinder, MD as Consulting Physician (General Surgery) Delice Bison, Charlestine Massed, NP as Nurse Practitioner (Hematology and Oncology) Viona Gilmore, [facility] as Pharmacist (Pharmacist) Alla Feeling, NP as Nurse Practitioner (Nurse Practitioner)  Date of Service:  11/19/2021  CHIEF COMPLAINT: f/u of left breast cancer  CURRENT THERAPY:  Surveillance  ASSESSMENT & PLAN:  Danielle [name] is a 76 y.o. post-menopausal female with   1. Malignant neoplasm of upper-outer quadrant of left breast, Stage IB, pT2N0M0, ER+/PR+, HER2-, Grade II -diagnosed in 11/2016. S/p left lumpectomy and adjuvant radiation.  -Oncotype Dx showed low risk -She started Tamoxifen in 06/2017, discontinued 09/2020 due to postmenopausal bleeding. She is now on surveillance. -most recent mammogram 01/16/21 was benign. -She is clinically doing well from a breast cancer standpoint. Labs reviewed, CBC showed chronic thrombocytopenia. CMP pending. Her physical exam showed only mild scar tissue, overall unremarkable. -we will see her in a year   2. Postmenopausal Bleeding -first noticed 07/2020, work up by Dr. [name] -tamoxifen discontinued -D&C on 11/17/20 showed benign pathology. -she continues [place] surveillance. She declined recent repeat D&C due to cost.   3. Osteoporosis, Arthritis with knee pain  -Underwent Left Hip surgery with rod placement in 2017, ambulates with walker -Her 10/2018 DEXA shows osteoporosis with lowest T-score -2.8. Repeat on 01/14/21 showed worsening T-score to -3.3 at right total femur. -On Fosamax  -Arthritis with chronic knee pain. She will  continues to f/u with pain specialist for possible injections.     4. Mild Thrombocytopenia, likely ITP  -Per pt she did have Platelet transfusion with her prior heart surgery.  -plt dropped to 58k today (11/19/21). Will monitor for now, and she can continue eliquis unless plt<50K.     PLAN:  -Mammogram due in 12/2021 -lab and f/u in 6 months for thrombocytopenia f/u -she will repeat lab including CBC with her cardiologist in 3 months, will copy her cardiologist Dr. Aundra Dubin    No problem-specific Assessment & Plan notes found for this encounter.   SUMMARY OF ONCOLOGIC HISTORY: Oncology History Overview Note  Cancer Staging Malignant neoplasm of upper-outer quadrant of left breast in female, estrogen receptor positive ([place]) Staging form: Breast, AJCC 8th Edition - Clinical stage from 12/13/2016: Stage IB (cT2, cN0, cM0, G2, ER: Positive, PR: Positive, HER2: Negative) - Signed by [name], MD on 12/21/2016 - Pathologic stage from 01/14/2017: Stage IA (pT2, pN0, cM0, G1, ER: Positive, PR: Positive, HER2: Negative, Oncotype DX score: 4) - Signed by [name], MD on 04/17/2017     Malignant neoplasm of upper-outer quadrant of left breast in female, estrogen receptor positive ([place])  12/06/2016 Mammogram   Diagnostic Mammogram 12/06/16 IMPRESSION:  Suspicious mass in the left breast at 3 o'clock 2 cm from the nipple measuring 1.9 x 1.1 x 2.2 cm. RECOMMENDATION: Ultrasound-guided core biopsy of the mass in the 3 o'clock region of the left breast is recommended. The biopsy will be scheduled at the patient's convenience.   12/13/2016 Initial Biopsy   Diagnosis 12/13/16 Breast, left, needle core biopsy, 3:00 o'clock, 2cmfn - INVASIVE DUCTAL CARCINOMA - SEE COMMENT   12/16/2016 Initial Diagnosis   Malignant neoplasm of upper-outer quadrant of left breast in female,  estrogen receptor positive ([place])   12/17/2016 Receptors her2   Estrogen Receptor: 100%, POSITIVE, STRONG STAINING  INTENSITY Progesterone Receptor: 100%, POSITIVE, STRONG STAINING INTENSITY Proliferation Marker Ki67: 30% HER2 - NEGATIVE    01/14/2017 Surgery   Left breast lumpectomy with Dr. Donne Hazel   01/14/2017 Pathology Results   Diagnosis 01/14/17 1. Breast, lumpectomy, Left - INVASIVE DUCTAL CARCINOMA, GRADE I/III, SPANNING 2.1 CM. - DUCTAL CARCINOMA IN SITU, LOW GRADE. - INVASIVE CARCINOMA IS BROADLY PRESENT AT THE INFERIOR MARGIN OF SPECIMEN 1. - DUCTAL CARCINOMA IN SITU IS FOCALLY PRESENT AT THE INFERIOR MARGIN OF SPECIMEN 1 AND BROADLY LESS THAN 0.1 CM TO THE LATERAL MARGIN OF SPECIMEN 1. - SEE ONCOLOGY TABLE BELOW. 2. Breast, excision, Additional medial margin left - DUCTAL CARCINOMA IN SITU, LOW GRADE. - DUCTAL CARCINOMA IS FOCALLY LESS THAN 0.1 CM TO THE NEW MARGIN OF SPECIMEN 2. 3. Breast, excision, Additional lateral margin left - DUCTAL CARCINOMA IN SITU, LOW GRADE. - DUCTAL CARCINOMA IN SITU IS GREATER THAN 0.2 CM TO ALL MARGINS. 4. Breast, excision, Additional superior margin left - DUCTAL CARCINOMA IN SITU, LOW GRADE. - DUCTAL CARCINOMA IN SITU IS BROADLY LESS THAN 0.1 CM TO THE NEW MARGIN OF SPECIMEN 4. 5. Lymph node, sentinel, biopsy, Left axillary - THERE IS NO EVIDENCE OF CARCINOMA IN 1 OF 1 LYMPH NODE (0/1). 6. Breast, excision, Additional inferior margin left - DUCTAL CARCINOMA IN SITU, LOW GRADE. - DUCTAL CARCINOMA IN SITU IS GREATER THAN 0.2 CM TO ALL MARGINS.    01/14/2017 Oncotype testing   Her oncotype recurrence score is 4 and her distance recurrent on Tamoxifen alone is 3%.   03/08/2017 - 04/05/2017 Radiation Therapy   RT with Dr. Lisbeth Renshaw    06/2017 -  Anti-estrogen oral therapy   Tamoxifen daily   12/15/2017 Mammogram   12/15/2017 Mammogram IMPRESSION: New lumpectomy site left breast. No mammographic evidence of malignancy in either breast.      INTERVAL HISTORY:  Danielle [name] is here for a follow up of breast cancer. She was last seen  by me one year ago. She presents to the clinic accompanied by her husband. She reports she is stable overall. She notes some fluctuating nodularity to her prior incision.   All other systems were reviewed with the patient and are negative.  MEDICAL HISTORY:  Past Medical History:  Diagnosis Date   Anticoagulated on Coumadin    managed by cardiology   Anxiety    Chronic constipation    Chronic systolic CHF (congestive heart failure) ([place]) 2016   cardiologist--- dr end and followed by CHF clinic   CKD (chronic kidney disease), stage III ([place])    COPD (chronic obstructive pulmonary disease) ([place])    previous had seen pulmonology --- dr Melvyn Novas, note in epic 03-14-2016, dx COPD GOLD 0   Coronary artery disease    cardiologist--- dr Durward Mallard;  last cardiac cath 01-10-20202 mild nonobstructive disease proxLAD;  per pt, had LHC prior to AVR in [place] 2016   DOE (dyspnea on exertion)    GERD (gastroesophageal reflux disease)    History of 2019 novel coronavirus disease (COVID-19) 12/19/2019   hospital admission w/ covid pneumonia , no intubatiion,  per pt symptoms resolved and back to baseline   History of cerebrovascular accident (CVA) with residual deficit    CVA in 2000 without residuals;  CVA post op AVR 01/ 2016 with residual right hand weakness   History of gastritis    History of pneumothorax    s/p  right vats 11-19-2014 and  s/p left vats  12-13-2015   (both spontenaous due to bleb)   Hyperlipidemia    Hypertension    Hyperthyroidism    per pt followed by pcp,  dx approx 2014   Malignant neoplasm of upper-outer quadrant of left breast in female, estrogen receptor positive ([place]) 11/2016   oncologist-- dr Burr Medico--- dx 10/ 2018  ,  s/p left lumptectomy with node dissection;  completed radiation 04-15-2017, no chemo   Multiple thyroid nodules    in care everywhere pt had left thyroid nodule biospy 09-18-2014 benign   NICM (nonischemic cardiomyopathy) ([place]) 2016   2016  ef 30%;  last echo in  epic ef 45%   OA (osteoarthritis)    Osteoporosis    Paroxysmal atrial fibrillation [facility])    cardiologist--- dr end   Personal history of radiation therapy    left breast cancer  03-08-2017  to 04-05-2017   Thrombocytopenia [facility])    followed by dr Burr Medico   Valvular stenosis, aortic 03/12/2014   Status post bioprosthetic AVR  in [place]  for severe AS   Wears dentures    full upper    SURGICAL HISTORY: Past Surgical History:  Procedure Laterality Date   AORTIC VALVE REPLACEMENT  03/12/2014   [facility] [place];  St Jude 39m, medial Trifecta Bioprosthesis   BREAST LUMPECTOMY WITH RADIOACTIVE SEED AND SENTINEL LYMPH NODE BIOPSY Left 01/14/2017   Procedure: LEFT BREAST LUMPECTOMY WITH RADIOACTIVE SEED AND SENTINEL LYMPH NODE BIOPSY;  Surgeon: WRolm Bookbinder MD;  [place]  Service: General;  Laterality: Left;   CARDIAC CATHETERIZATION  02/12/2014   [facility] [place] N/A 11/17/2020   Procedure: DILATATION AND CURETTAGE /HYSTEROSCOPY WITH MYOSURE;  Surgeon: ANunzio Cobbs MD;  [place]  Service: Gynecology;  Laterality: N/A;   INTRAMEDULLARY (IM) NAIL INTERTROCHANTERIC Left 12/10/2015   Procedure: INTRAMEDULLARY (IM) NAIL INTERTROCHANTRIC;  Surgeon: NLeandrew Koyanagi MD;  [place]  Service: Orthopedics;  Laterality: Left;   OPERATIVE ULTRASOUND N/A 11/17/2020   Procedure: OPERATIVE ULTRASOUND;  Surgeon: ANunzio Cobbs MD;  [facility]  Service: Gynecology;  Laterality: N/A;   PLEURADESIS Left 12/03/2015   Procedure: PLEURADESIS;  Surgeon: SMelrose Nakayama MD;  [place]  Service: Thoracic;  Laterality: Left;   RESECTION OF APICAL BLEB Left 12/03/2015   Procedure: BLEBECTOMY;  Surgeon: SMelrose Nakayama MD;  [place]  Service: Thoracic;  Laterality: Left;   RIGHT/LEFT HEART CATH AND CORONARY ANGIOGRAPHY N/A 03/10/2020   Procedure:  RIGHT/LEFT HEART CATH AND CORONARY ANGIOGRAPHY;  Surgeon: ENelva Bush MD;  [place];  Service: Cardiovascular;  Laterality: N/A;   VIDEO ASSISTED THORACOSCOPY Left 12/03/2015   Procedure: VIDEO ASSISTED THORACOSCOPY;  Surgeon: SMelrose Nakayama MD;  [place]  Service: Thoracic;  Laterality: Left;   VIDEO ASSISTED THORACOSCOPY (VATS) W/TALC PLEUADESIS Right 11/19/2014   [place]   I have reviewed the social history and family history with the patient and they are unchanged from previous note.  ALLERGIES:  is allergic to lisinopril, tetanus toxoid adsorbed, other, and peanut allergen powder-dnfp.  MEDICATIONS:  Current Outpatient Medications  Medication Sig Dispense Refill   albuterol (PROVENTIL HFA;VENTOLIN HFA) 108 (90 Base) MCG/ACT inhaler Inhale 2 puffs into the lungs every 6 (six) hours as needed for wheezing or shortness of breath.  alendronate (FOSAMAX) 70 MG tablet Take 1 tablet (70 mg total) by mouth once a week. Take with a full glass of water on an empty stomach. 13 tablet 3   apixaban (ELIQUIS) 5 MG TABS tablet Take 1 tablet (5 mg total) by mouth 2 (two) times daily. 180 tablet 3   ascorbic acid (VITAMIN C) 500 MG tablet Take 500 mg by mouth daily.     budesonide-formoterol (SYMBICORT) 160-4.5 MCG/ACT inhaler Inhale 2 puffs into the lungs daily as needed. 3 each 3   carvedilol (COREG) 25 MG tablet Take 1 tablet (25 mg total) by mouth 2 (two) times daily with a meal. 180 tablet 3   Cholecalciferol 25 MCG (1000 UT) tablet Take 1,000 Units by mouth daily.     fluconazole (DIFLUCAN) 150 MG tablet Take 150 mg by mouth once.     fluticasone (FLONASE) 50 MCG/ACT nasal spray Place 1 spray into both nostrils daily. 16 g 3   methimazole (TAPAZOLE) 5 MG tablet Take 3 tablets (15 mg total) by mouth daily. 270 tablet 3   Multiple Vitamin (MULTIVITAMIN) tablet Take 1 tablet by mouth daily.     pantoprazole (PROTONIX) 40 MG tablet Take 1 tablet (40  mg total) by mouth daily. 30 tablet 11   rosuvastatin (CRESTOR) 40 MG tablet Take 1 tablet (40 mg total) by mouth daily. 90 tablet 3   sacubitril-valsartan (ENTRESTO) 49-51 MG Take 1 tablet by mouth 2 (two) times daily. 60 tablet 4   senna-docusate (SENOKOT-S) 8.6-50 MG tablet Take 2 tablets by mouth at bedtime.     spironolactone (ALDACTONE) 25 MG tablet Take 1 tablet (25 mg total) by mouth daily. 90 tablet 3   torsemide (DEMADEX) 20 MG tablet Take 1 tablet (20 mg total) by mouth as needed (weight gain / swelling).     traMADol (ULTRAM) 50 MG tablet Take 1 tablet (50 mg total) by mouth 3 (three) times daily. 90 tablet 5   trolamine salicylate (ASPERCREME) 10 % cream Apply 1 application topically as needed for muscle pain.     valACYclovir (VALTREX) 500 MG tablet Take on tablet by mouth twice a day for 3 days as needed for an outbreak 30 tablet 0   zolpidem (AMBIEN) 5 MG tablet Take 0.5-1 tablets (2.5-5 mg total) by mouth at bedtime as needed for sleep. 15 tablet 0   No current facility-administered medications for this visit.    PHYSICAL EXAMINATION: ECOG PERFORMANCE STATUS: 3 - Symptomatic, >50% confined to bed  Vitals:   11/19/21 1007  BP: 122/77  Pulse: 67  Resp: 18  Temp: 98.1 F (36.7 C)  SpO2: 99%   Wt Readings from Last 3 Encounters:  11/19/21 223 lb (101.2 kg)  10/27/21 228 lb (103.4 kg)  10/13/21 232 lb 12.8 oz (105.6 kg)     GENERAL:alert, no distress and comfortable SKIN: skin color, texture, turgor are normal, no rashes or significant lesions EYES: normal, Conjunctiva are pink and non-injected, sclera clear  Musculoskeletal:no cyanosis of digits and no clubbing  NEURO: alert & oriented x 3 with fluent speech, no focal motor/sensory deficits BREAST: mild scar tissue at incision site in left breast. No palpable mass, nodules or adenopathy bilaterally. Breast exam benign.   LABORATORY DATA:  I have reviewed the data as listed    Latest Ref Rng & Units 11/19/2021     9:37 AM 11/21/2020    9:34 AM 11/17/2020    6:04 AM  CBC  WBC 4.0 - 10.5 K/uL 5.0  6.3  Hemoglobin 12.0 - 15.0 g/dL 12.2  10.9  12.9   Hematocrit 36.0 - 46.0 % 36.3  33.6  38.0   Platelets 150 - 400 K/uL 58  70          Latest Ref Rng & Units 11/19/2021    9:37 AM 10/13/2021   10:36 AM 07/14/2021    9:39 AM  CMP  Glucose 70 - 99 mg/dL 95  111  109   BUN 8 - 23 mg/dL '19  14  14   ' Creatinine 0.44 - 1.00 mg/dL 1.67  1.52  1.57   Sodium 135 - 145 mmol/L 137  138  140   Potassium 3.5 - 5.1 mmol/L 4.9  4.6  4.5   Chloride 98 - 111 mmol/L 107  110  112   CO2 22 - 32 mmol/L '25  23  23   ' Calcium 8.9 - 10.3 mg/dL 10.1  10.3  10.3   Total Protein 6.5 - 8.1 g/dL 6.9     Total Bilirubin 0.3 - 1.2 mg/dL 0.6     Alkaline Phos 38 - 126 U/L 56     AST 15 - 41 U/L 12     ALT 0 - 44 U/L 8         RADIOGRAPHIC STUDIES: I have personally reviewed the radiological images as listed and agreed with the findings in the report. No results found.    Orders Placed This Encounter  Procedures   MM Digital Screening    Standing Status:   Future    Standing Expiration Date:   11/19/2022    Order Specific Question:   Reason for Exam (SYMPTOM  OR DIAGNOSIS REQUIRED)    Answer:   screening    Order Specific Question:   Preferred imaging location?    Answer:   [facility]   All questions were answered. The patient knows to call the clinic with any problems, questions or concerns. No barriers to learning was detected. The total time spent in the appointment was 25 minutes.     [name], MD 11/19/2021   I, Wilburn Mylar, am acting as scribe for [name], MD.   I have reviewed the above documentation for accuracy and completeness, and I agree with the above.

## 2021-11-25 ENCOUNTER — Ambulatory Visit: Payer: Medicare HMO | Admitting: Internal Medicine

## 2021-12-16 ENCOUNTER — Telehealth: Payer: Self-pay

## 2021-12-16 NOTE — Telephone Encounter (Signed)
She states she is not sure but her right shoulder hurts to so she prefers to get the right shoulder injection instead of left.

## 2021-12-16 NOTE — Telephone Encounter (Signed)
Patient called stating she is scheduled for left shoulder injection but she had breast cancer with radiation on that side about 4 or 5 years ago. She is concerned. Should she have the injection?

## 2021-12-17 ENCOUNTER — Encounter: Payer: Self-pay | Admitting: Physical Medicine & Rehabilitation

## 2021-12-17 ENCOUNTER — Encounter: Payer: Medicare PPO | Attending: Physical Medicine & Rehabilitation | Admitting: Physical Medicine & Rehabilitation

## 2021-12-17 VITALS — BP 103/60 | HR 73 | Temp 97.9°F | Ht 67.0 in | Wt 231.0 lb

## 2021-12-17 DIAGNOSIS — G8929 Other chronic pain: Secondary | ICD-10-CM | POA: Insufficient documentation

## 2021-12-17 DIAGNOSIS — M25512 Pain in left shoulder: Secondary | ICD-10-CM | POA: Insufficient documentation

## 2021-12-17 DIAGNOSIS — M25511 Pain in right shoulder: Secondary | ICD-10-CM | POA: Insufficient documentation

## 2021-12-17 MED ORDER — BETAMETHASONE SOD PHOS & ACET 6 (3-3) MG/ML IJ SUSP
6.0000 mg | Freq: Once | INTRAMUSCULAR | Status: AC
  Administered 2021-12-17: 6 mg via INTRA_ARTICULAR

## 2021-12-17 MED ORDER — LIDOCAINE HCL 1 % IJ SOLN
4.0000 mL | Freq: Once | INTRAMUSCULAR | Status: AC
  Administered 2021-12-17: 4 mL

## 2021-12-17 NOTE — Progress Notes (Signed)
Shoulder injection Right  Without ultrasound guidance  Indication:RIght Shoulder pain not relieved by medication management and other conservative care.  Informed consent was obtained after describing risks and benefits of the procedure with the patient, this includes bleeding, bruising, infection and medication side effects. The patient wishes to proceed and has given written consent. Patient was placed in a seated position. TheRight shoulder was marked and prepped with betadine in the subacromial area. A 25-gauge 1-1/2 inch needle was inserted into the subacromial area. After negative draw back for blood, a solution containing 1 mL of 6 mg per ML betamethasone and 4 mL of 1% lidocaine was injected. A band aid was applied. The patient tolerated the procedure well. Post procedure instructions were given.

## 2021-12-22 ENCOUNTER — Telehealth: Payer: Self-pay | Admitting: Pharmacist

## 2021-12-22 NOTE — Chronic Care Management (AMB) (Signed)
Chronic Care Management Pharmacy Assistant   Name: Danielle [name]  MRN: [id number] DOB: 03-24-1945  Reason for Encounter: Disease State / Hypertension Assessment Call   Conditions to be addressed/monitored: HTN  Recent office visits:  08/28/2021 Danielle [name] MD - Patient was seen for Hypertensive heart disease with heart failure and additional concerns. No additional chart notes.   Recent consult visits:  12/17/2021 Danielle Penna MD (Physical Med) - Patient was seen for Chronic pain of both shoulders. No medication changes. Follow up in 4 weeks.   11/19/2021 Danielle Merle MD (oncology) - Patient was seen for Malignant neoplasm of upper-outer quadrant of left breast in female, estrogen receptor positive. No medication changes. No follow up noted.   10/27/2021 Danielle Penna MD (Physical Med) - Patient was seen for Chronic pain of both shoulders. No medication changes. Follow up in 6 weeks.   10/06/2021 Danielle Bame NP (heartcare) - Patient was seen for Preop cardiovascular exam. No medication changes. No follow up noted.   09/17/2021 Danielle Poisson MD (obgyn) - Patient was seen for postmenopausal bleeding. No medication changes. No follow up noted.   07/28/2021 Danielle Penna MD (Physical Med) - Patient was seen for Primary osteoarthritis of both knees and additional concerns. No medication changes. Follow up in 3 months.   Hospital visits:  None  Medications: Outpatient Encounter Medications as of 12/22/2021  Medication Sig Note   albuterol (PROVENTIL HFA;VENTOLIN HFA) 108 (90 Base) MCG/ACT inhaler Inhale 2 puffs into the lungs every 6 (six) hours as needed for wheezing or shortness of breath.    alendronate (FOSAMAX) 70 MG tablet Take 1 tablet (70 mg total) by mouth once a week. Take with a full glass of water on an empty stomach.    apixaban (ELIQUIS) 5 MG TABS tablet Take 1 tablet (5 mg total) by mouth 2 (two) times daily.    ascorbic acid (VITAMIN C) 500  MG tablet Take 500 mg by mouth daily.    budesonide-formoterol (SYMBICORT) 160-4.5 MCG/ACT inhaler Inhale 2 puffs into the lungs daily as needed.    carvedilol (COREG) 25 MG tablet Take 1 tablet (25 mg total) by mouth 2 (two) times daily with a meal.    Cholecalciferol 25 MCG (1000 UT) tablet Take 1,000 Units by mouth daily.    fluconazole (DIFLUCAN) 150 MG tablet Take 150 mg by mouth once.    fluticasone (FLONASE) 50 MCG/ACT nasal spray Place 1 spray into both nostrils daily.    methimazole (TAPAZOLE) 5 MG tablet Take 3 tablets (15 mg total) by mouth daily.    Multiple Vitamin (MULTIVITAMIN) tablet Take 1 tablet by mouth daily.    pantoprazole (PROTONIX) 40 MG tablet Take 1 tablet (40 mg total) by mouth daily.    rosuvastatin (CRESTOR) 40 MG tablet Take 1 tablet (40 mg total) by mouth daily.    sacubitril-valsartan (ENTRESTO) 49-51 MG Take 1 tablet by mouth 2 (two) times daily.    senna-docusate (SENOKOT-S) 8.6-50 MG tablet Take 2 tablets by mouth at bedtime.    spironolactone (ALDACTONE) 25 MG tablet Take 1 tablet (25 mg total) by mouth daily.    torsemide (DEMADEX) 20 MG tablet Take 1 tablet (20 mg total) by mouth as needed (weight gain / swelling).    traMADol (ULTRAM) 50 MG tablet Take 1 tablet (50 mg total) by mouth 3 (three) times daily. 10/27/2021: Filled on 07/08/21 for 90 today 9 last dose 7/41/28   trolamine salicylate (ASPERCREME) 10 % cream Apply 1 application topically as  needed for muscle pain.    valACYclovir (VALTREX) 500 MG tablet Take on tablet by mouth twice a day for 3 days as needed for an outbreak    zolpidem (AMBIEN) 5 MG tablet Take 0.5-1 tablets (2.5-5 mg total) by mouth at bedtime as needed for sleep.    No facility-administered encounter medications on file as of 12/22/2021.  Fill History:  ZOLPIDEM '5MG'$         TAB 04/13/2021 15   ValACYclovir '500MG'$   TAB 11/10/2020 15   TRAMADOL '50MG'$  TAB 12/10/2021 30   torsemide 20 mg tablet 10/07/2021 90   spironolactone 25 mg  tablet 10/21/2021 90   Entresto 49 mg-51 mg tablet 04/15/2021 30   rosuvastatin 40 mg tablet 10/07/2021 90   PANTOPRAZOLE '40MG'$  TAB 03/28/2021 30   methimazole 5 mg tablet 10/12/2021 90   FLUTICASONE 50MCG   SPR 01/30/2020 30   FLUCONAZOLE '150MG'$    TAB 07/08/2021 1   carvedilol 25 mg tablet 09/29/2021 90   Eliquis 5 mg tablet 08/11/2021 90   ALENDRONATE SODIUM  70 MG TABS 01/06/2020 84   Reviewed chart prior to disease state call. Spoke with patient regarding BP  Recent Office Vitals: BP Readings from Last 3 Encounters:  12/17/21 103/60  11/19/21 122/77  10/27/21 106/68   Pulse Readings from Last 3 Encounters:  12/17/21 73  11/19/21 67  10/27/21 72    Wt Readings from Last 3 Encounters:  12/17/21 231 lb (104.8 kg)  11/19/21 223 lb (101.2 kg)  10/27/21 228 lb (103.4 kg)     Kidney Function Lab Results  Component Value Date/Time   CREATININE 1.67 (H) 11/19/2021 09:37 AM   CREATININE 1.52 (H) 10/13/2021 10:36 AM   CREATININE 1.57 (H) 07/14/2021 09:39 AM   CREATININE 1.40 (H) 11/21/2020 09:34 AM   CREATININE 1.09 (H) 12/31/2019 11:55 AM   CREATININE 1.2 (H) 12/22/2016 08:50 AM   GFR 33.40 (L) 04/13/2021 10:04 AM   GFRNONAA 32 (L) 11/19/2021 09:37 AM   GFRNONAA 50 (L) 12/31/2019 11:55 AM   GFRAA 57 (L) 02/27/2020 09:24 AM   GFRAA 58 (L) 12/31/2019 11:55 AM       Latest Ref Rng & Units 11/19/2021    9:37 AM 10/13/2021   10:36 AM 07/14/2021    9:39 AM  BMP  Glucose 70 - 99 mg/dL 95  111  109   BUN 8 - 23 mg/dL '19  14  14   '$ Creatinine 0.44 - 1.00 mg/dL 1.67  1.52  1.57   Sodium 135 - 145 mmol/L 137  138  140   Potassium 3.5 - 5.1 mmol/L 4.9  4.6  4.5   Chloride 98 - 111 mmol/L 107  110  112   CO2 22 - 32 mmol/L '25  23  23   '$ Calcium 8.9 - 10.3 mg/dL 10.1  10.3  10.3    Schedule fu Current antihypertensive regimen:  Carvedilol 25 mg twice daily Entresto 49/51 mg twice daily Spironolactone 25 mg daily  How often are you checking your Blood Pressure? {CHL HP  BP Monitoring Frequency:[phone]}  Current home BP readings: ***  What recent interventions/DTPs have been made by any provider to improve Blood Pressure control since last CPP Visit: No recent interventions.   Any recent hospitalizations or ED visits since last visit with CPP? No recent hospital visits.   What diet changes have been made to improve Blood Pressure Control?  Patient follows Lead exercise is  being done to improve your Blood Pressure Control?  ***  Adherence Review: Is the patient currently on ACE/ARB medication? No Does the patient have >5 day gap between last estimated fill dates? No  Care Gaps: AWV - scheduled for 02/01/2022 Last BP - 103/60 on 12/17/2021 Last A1C - 5.2 on 05/04/2021 AWV - never done - Completed 01/19/2021 Covid - overdue Flu - due Colonoscopy - postponed  Star Rating Drugs: Rosuvastatin '40mg'$  - last filled 10/07/2021 90 DS at [facility] [phone]

## 2021-12-28 ENCOUNTER — Other Ambulatory Visit: Payer: Self-pay

## 2021-12-28 ENCOUNTER — Emergency Department (HOSPITAL_COMMUNITY): Payer: Medicare PPO

## 2021-12-28 ENCOUNTER — Encounter (HOSPITAL_COMMUNITY): Payer: Self-pay

## 2021-12-28 ENCOUNTER — Telehealth: Payer: Self-pay

## 2021-12-28 ENCOUNTER — Emergency Department (HOSPITAL_COMMUNITY)
Admission: EM | Admit: 2021-12-28 | Discharge: 2021-12-29 | Disposition: A | Discharge status: Home / Self Care | Payer: Medicare PPO | Attending: Emergency Medicine | Admitting: Emergency Medicine

## 2021-12-28 DIAGNOSIS — Z7901 Long term (current) use of anticoagulants: Secondary | ICD-10-CM | POA: Insufficient documentation

## 2021-12-28 DIAGNOSIS — M25552 Pain in left hip: Secondary | ICD-10-CM

## 2021-12-28 DIAGNOSIS — Z853 Personal history of malignant neoplasm of breast: Secondary | ICD-10-CM | POA: Diagnosis not present

## 2021-12-28 DIAGNOSIS — X58XXXA Exposure to other specified factors, initial encounter: Secondary | ICD-10-CM | POA: Insufficient documentation

## 2021-12-28 DIAGNOSIS — R600 Localized edema: Secondary | ICD-10-CM | POA: Insufficient documentation

## 2021-12-28 DIAGNOSIS — S76012A Strain of muscle, fascia and tendon of left hip, initial encounter: Secondary | ICD-10-CM | POA: Insufficient documentation

## 2021-12-28 DIAGNOSIS — M8588 Other specified disorders of bone density and structure, other site: Secondary | ICD-10-CM | POA: Diagnosis not present

## 2021-12-28 DIAGNOSIS — Z91010 Allergy to peanuts: Secondary | ICD-10-CM | POA: Diagnosis not present

## 2021-12-28 DIAGNOSIS — K5730 Diverticulosis of large intestine without perforation or abscess without bleeding: Secondary | ICD-10-CM | POA: Diagnosis not present

## 2021-12-28 DIAGNOSIS — T148XXA Other injury of unspecified body region, initial encounter: Secondary | ICD-10-CM

## 2021-12-28 DIAGNOSIS — M545 Low back pain, unspecified: Secondary | ICD-10-CM | POA: Diagnosis not present

## 2021-12-28 DIAGNOSIS — K8020 Calculus of gallbladder without cholecystitis without obstruction: Secondary | ICD-10-CM | POA: Diagnosis not present

## 2021-12-28 MED ORDER — OXYCODONE-ACETAMINOPHEN 5-325 MG PO TABS
1.0000 | ORAL_TABLET | ORAL | Status: AC | PRN
  Administered 2021-12-28 – 2021-12-29 (×2): 1 via ORAL
  Filled 2021-12-28 (×2): qty 1

## 2021-12-28 NOTE — ED Notes (Signed)
Pt states that they are in a lot of pain and would like more pain medicine. Triage RN informed.

## 2021-12-28 NOTE — ED Triage Notes (Addendum)
Reports left hip pain that started this am. Reports worse this am.  Has rod in left hip from fall a few years ago.  Denies fall or injury

## 2021-12-29 ENCOUNTER — Emergency Department (HOSPITAL_COMMUNITY): Payer: Medicare PPO

## 2021-12-29 DIAGNOSIS — K5730 Diverticulosis of large intestine without perforation or abscess without bleeding: Secondary | ICD-10-CM | POA: Diagnosis not present

## 2021-12-29 DIAGNOSIS — K8020 Calculus of gallbladder without cholecystitis without obstruction: Secondary | ICD-10-CM | POA: Diagnosis not present

## 2021-12-29 DIAGNOSIS — M545 Low back pain, unspecified: Secondary | ICD-10-CM | POA: Diagnosis not present

## 2021-12-29 DIAGNOSIS — M8588 Other specified disorders of bone density and structure, other site: Secondary | ICD-10-CM | POA: Diagnosis not present

## 2021-12-29 MED ORDER — LIDOCAINE 5 % EX PTCH: 1.0000 | MEDICATED_PATCH | CUTANEOUS | 0 refills | Status: DC

## 2021-12-29 MED ORDER — LIDOCAINE 5 % EX PTCH
2.0000 | MEDICATED_PATCH | CUTANEOUS | Status: DC
  Administered 2021-12-29: 2 via TRANSDERMAL
  Filled 2021-12-29: qty 2

## 2021-12-29 MED ORDER — CYCLOBENZAPRINE HCL 5 MG PO TABS
5.0000 mg | ORAL_TABLET | Freq: Two times a day (BID) | ORAL | 0 refills | Status: DC | PRN
Start: 2021-12-29 — End: 2022-12-25

## 2021-12-29 NOTE — ED Notes (Signed)
Pt stop this tech and ask for pain meds again. Triage nurse made aware

## 2021-12-29 NOTE — ED Provider Notes (Signed)
[facility] EMERGENCY DEPARTMENT Provider Note   CSN: [id number] Arrival date & time: 12/28/21  1454     History  Chief Complaint  Patient presents with   Hip Pain    Danielle [name] is a 76 y.o. female.  76 year old female with a history of breast cancer and left proximal femur fracture status postrepair who presents to the emergency department with left gluteal pain.  Patient states that she had hip surgery approximately 5 years ago and had been doing well up until this morning when she started having left hip and buttocks pain.  Describes it as sharp and worsened with movement.  Still present at rest.  Says that she has not had any new numbness or weakness of her feet but the pain is now limiting her ability to walk.  Denies any back pain or new bowel or bladder incontinence.  No known injuries or trauma to her hip or back.       Home Medications Prior to Admission medications   Medication Sig Start Date End Date Taking? Authorizing Provider  cyclobenzaprine (FLEXERIL) 5 MG tablet Take 1 tablet (5 mg total) by mouth 2 (two) times daily as needed for muscle spasms. 12/29/21  Yes Fransico Meadow, MD  lidocaine (LIDODERM) 5 % Place 1 patch onto the skin daily. Remove & Discard patch within 12 hours or as directed by MD 12/29/21  Yes Fransico Meadow, MD  albuterol (PROVENTIL HFA;VENTOLIN HFA) 108 (90 Base) MCG/ACT inhaler Inhale 2 puffs into the lungs every 6 (six) hours as needed for wheezing or shortness of breath. 12/11/14   [provider]  alendronate (FOSAMAX) 70 MG tablet Take 1 tablet (70 mg total) by mouth once a week. Take with a full glass of water on an empty stomach. 07/24/19   [name], [name], MD  apixaban (ELIQUIS) 5 MG TABS tablet Take 1 tablet (5 mg total) by mouth 2 (two) times daily. 03/26/21   Larey Dresser, MD  ascorbic acid (VITAMIN C) 500 MG tablet Take 500 mg by mouth daily.    [provider]   budesonide-formoterol (SYMBICORT) 160-4.5 MCG/ACT inhaler Inhale 2 puffs into the lungs daily as needed. 11/25/20   [name], [name], MD  carvedilol (COREG) 25 MG tablet Take 1 tablet (25 mg total) by mouth 2 (two) times daily with a meal. 07/14/21   Larey Dresser, MD  Cholecalciferol 25 MCG (1000 UT) tablet Take 1,000 Units by mouth daily.    [provider]  fluconazole (DIFLUCAN) 150 MG tablet Take 150 mg by mouth once. 07/08/21   [provider]  fluticasone (FLONASE) 50 MCG/ACT nasal spray Place 1 spray into both nostrils daily. 01/30/20   [name], [name], MD  methimazole (TAPAZOLE) 5 MG tablet Take 3 tablets (15 mg total) by mouth daily. 03/31/21   [name], [name], MD  Multiple Vitamin (MULTIVITAMIN) tablet Take 1 tablet by mouth daily.    [provider]  pantoprazole (PROTONIX) 40 MG tablet Take 1 tablet (40 mg total) by mouth daily. 10/06/20   Larey Dresser, MD  rosuvastatin (CRESTOR) 40 MG tablet Take 1 tablet (40 mg total) by mouth daily. 03/26/21   Larey Dresser, MD  sacubitril-valsartan (ENTRESTO) 49-51 MG Take 1 tablet by mouth 2 (two) times daily. 03/13/21   [place], Maricela Bo, FNP  senna-docusate (SENOKOT-S) 8.6-50 MG tablet Take 2 tablets by mouth at bedtime.    [provider]  spironolactone (ALDACTONE) 25 MG tablet Take  1 tablet (25 mg total) by mouth daily. 03/26/21   Larey Dresser, MD  torsemide (DEMADEX) 20 MG tablet Take 1 tablet (20 mg total) by mouth as needed (weight gain / swelling). 05/29/21   End, Harrell Gave, MD  traMADol (ULTRAM) 50 MG tablet Take 1 tablet (50 mg total) by mouth 3 (three) times daily. 07/28/21   Kirsteins, Luanna Salk, MD  trolamine salicylate (ASPERCREME) 10 % cream Apply 1 application topically as needed for muscle pain.    [provider]  valACYclovir (VALTREX) 500 MG tablet Take on tablet by mouth twice a day for 3 days as needed for an outbreak 11/10/20   Yisroel Ramming, Everardo All, MD  zolpidem (AMBIEN) 5  MG tablet Take 0.5-1 tablets (2.5-5 mg total) by mouth at bedtime as needed for sleep. 04/13/21   [name], [name], MD      Allergies    Lisinopril, Tetanus toxoid adsorbed, Other, and Peanut allergen powder-dnfp    Review of Systems   Review of Systems  Physical Exam Updated Vital Signs BP 108/70   Pulse 88   Temp 97.7 F (36.5 C)   Resp 19   Ht '5\' 7"'$  (1.702 m)   Wt 104.8 kg   SpO2 99%   BMI 36.18 kg/m  Physical Exam Vitals and nursing note reviewed.  Constitutional:      General: She is not in acute distress.    Appearance: She is well-developed.  HENT:     Head: Normocephalic and atraumatic.     Right Ear: External ear normal.     Left Ear: External ear normal.     Nose: Nose normal.  Eyes:     Extraocular Movements: Extraocular movements intact.     Conjunctiva/sclera: Conjunctivae normal.     Pupils: Pupils are equal, round, and reactive to light.  Pulmonary:     Effort: Pulmonary effort is normal. No respiratory distress.  Musculoskeletal:        General: No swelling.     Cervical back: Normal range of motion and neck supple.     Right lower leg: Edema (Trace) present.     Left lower leg: Edema (Trace) present.     Comments: No spinal midline TTP in thoracic or lumbar spine. No stepoffs noted.   Motor: Muscle bulk and tone are normal. Strength is 5/5 in hip flexion, knee flexion and extension, ankle dorsiflexion and plantar flexion bilaterally. Full strength of great toe dorsiflexion bilaterally.  Sensory: Intact sensation to light touch in L2 though S1 dermatomes bilaterally.   Tenderness to palpation of left hip and gluteus  Skin:    General: Skin is warm and dry.     Capillary Refill: Capillary refill takes less than 2 seconds.  Neurological:     Mental Status: She is alert and oriented to person, place, and time. Mental status is at baseline.  Psychiatric:        Mood and Affect: Mood normal.     ED Results / Procedures / Treatments   Labs (all  labs ordered are listed, but only abnormal results are displayed) Labs Reviewed - No data to display  EKG None  Radiology CT ABDOMEN PELVIS WO CONTRAST  Result Date: 12/29/2021 CLINICAL DATA:  LEFT lower back and LEFT hip pain EXAM: CT ABDOMEN AND PELVIS WITHOUT CONTRAST TECHNIQUE: Multidetector CT imaging of the abdomen and pelvis was performed following the standard protocol without IV contrast. RADIATION DOSE REDUCTION: This exam was performed according to the departmental dose-optimization  program which includes automated exposure control, adjustment of the mA and/or kV according to patient size and/or use of iterative reconstruction technique. COMPARISON:  08/28/2018 FINDINGS: Lower chest: Linear scarring RIGHT lower lobe. Emphysematous changes. Minimal bibasilar subpleural interstitial thickening unchanged. Hepatobiliary: Dependent tiny calculi within gallbladder. Liver unremarkable. No biliary dilatation or calcified choledocholithiasis. Pancreas: Normal appearance Spleen: Normal appearance Adrenals/Urinary Tract: Tiny exophytic LEFT renal cyst 11 mm diameter image 31 unchanged; no follow-up imaging recommended. Adrenal glands, kidneys, ureters, and bladder otherwise normal appearance Stomach/Bowel: Normal appendix. Diverticulosis of distal colon from splenic flexure to proximal sigmoid colon without evidence of diverticulitis. Low lying cecum in RIGHT pelvis. Stomach decompressed with suboptimal assessment of wall thickness. Remaining bowel loops normal. Vascular/Lymphatic: Scattered pelvic phleboliths. Atherosclerotic calcifications aorta and iliac arteries without aneurysm. Reproductive: Atrophic uterus and ovaries Other: No free air or hernia. Small amount of nonspecific free fluid in pelvis. Musculoskeletal: Diffuse osseous demineralization. Orthopedic hardware proximal LEFT femur. No acute osseous findings. Scattered degenerative disc disease changes thoracolumbar spine. IMPRESSION: Distal  colonic diverticulosis without evidence of diverticulitis. Cholelithiasis without evidence of cholecystitis. Small amount of nonspecific free fluid in pelvis. No acute intra-abdominal or intrapelvic abnormalities. Aortic Atherosclerosis (ICD10-I70.0). Electronically Signed   By: Lavonia Dana M.D.   On: 12/29/2021 10:19   CT L-SPINE NO CHARGE  Result Date: 12/29/2021 CLINICAL DATA:  Left lower back and hip pain EXAM: CT Lumbar Spine without contrast TECHNIQUE: Technique: Multiplanar CT images of the lumbar spine were reconstructed from contemporary CT of the Abdomen and Pelvis. RADIATION DOSE REDUCTION: This exam was performed according to the departmental dose-optimization program which includes automated exposure control, adjustment of the mA and/or kV according to patient size and/or use of iterative reconstruction technique. CONTRAST:  None COMPARISON:  Lumbar MRI 04/06/2017 FINDINGS: Segmentation: 5 lumbar type vertebrae. Alignment: Normal. Vertebrae: Generalized osteopenia. Negative for fracture or focal bone lesion Paraspinal and other soft tissues: Retroperitoneal findings described separately on dedicated source images of the abdomen Disc levels: L1-L2: Disc narrowing with gas containing fissure. Mild endplate and facet spurring. Mild bilateral foraminal narrowing L2-L3: Disc narrowing with gas containing fissure. Endplate and facet spurring. Patent appearance of the canal and foramina L3-L4: Disc narrowing and bulging with endplate and facet spurring. Mild spinal and bilateral foraminal stenosis L4-L5: Disc narrowing with endplate ridging on the left more than right. Asymmetric left facet spurring. Advanced left foraminal stenosis. Moderate right foraminal narrowing L5-S1:Disc narrowing and bulging with gas containing fissure. Degenerative facet spurring on both sides. Mild bilateral foraminal narrowing. IMPRESSION: 1. No acute finding. 2. Generalized lumbar spine degeneration as described. 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 Electronically Signed   By: Jorje Guild M.D.   On: 12/29/2021 10:18   DG Hip Unilat With Pelvis 2-3 Views Left  Result Date: 12/28/2021 CLINICAL DATA:  Left hip pain EXAM: DG HIP (WITH OR WITHOUT PELVIS) 2-3V LEFT COMPARISON:  10/25/2016 FINDINGS: There is previous internal fixation in left femur with intramedullary rod. Distal portion of the intramedullary rod is not included in the images. No definite recent fracture is seen. Deformity in the left pubic bone has not changed significantly. Deformity in intertrochanteric region of proximal femur has not changed. Degenerative changes are noted in left hip and lumbar spine. IMPRESSION: No recent fracture is seen in left hip. Electronically Signed   By: Elmer Picker M.D.   On: 12/28/2021 16:53    Procedures Procedures   Medications Ordered in ED Medications  lidocaine (LIDODERM) 5 %  2 patch (2 patches Transdermal Patch Applied 12/29/21 0906)  oxyCODONE-acetaminophen (PERCOCET/ROXICET) 5-325 MG per tablet 1 tablet (1 tablet Oral Given 12/29/21 0906)    ED Course/ Medical Decision Making/ A&P                           Medical Decision Making Amount and/or Complexity of Data Reviewed Radiology: ordered.  Risk Prescription drug management.   Avya Flavell is a 76 y.o. female with comorbidities that complicate the patient evaluation including chronic pain, breast cancer, and left proximal femur fracture status post operative repair who presents with chief complaint of atraumatic left hip pain.  This patient presents to the ED for concern of complaints listed in HPI, this involves an extensive number of treatment options, and is a complaint that carries with it a high risk of complications and morbidity. Disposition including potential need for admission considered.   Initial Ddx:  Pathologic fracture, muscle strain, lumbar radiculopathy  MDM:  Initially was concerned about  possible pathologic fracture given the patient's history of cancer.  Also on the differential would be muscle strain.  No back pain that would suggest radiculopathy and patient is not having any burning that would suggest this.  Plan:  Hip x-ray Percocet  ED Summary/Re-evaluation:  Patient underwent the above work-up which did not show acute abnormality.  Upon my reevaluation she was still having significant pain.  CT scans were obtained to rule out either occult fracture or possible pathologic fracture of the back or pelvis.  These did not reveal acute pathology that would explain her symptoms.  As such feel that she likely has a muscle strain.  We will have her follow-up with her pain medicine doctor, primary doctor, and sports medicine if her symptoms do not improve.  Was given a short course of Flexeril for her suspected muscle strain.  Dispo: DC Home. Return precautions discussed including, but not limited to, those listed in the AVS. Allowed pt time to ask questions which were answered fully prior to dc.   Additional history obtained from family Records reviewed Outpatient Clinic Notes I independently reviewed the following imaging with scope of interpretation limited to determining acute life threatening conditions related to emergency care: Extremity x-ray(s), which revealed no acute abnormality  I personally reviewed and interpreted cardiac monitoring: normal sinus rhythm  I personally reviewed and interpreted the pt's EKG: see above for interpretation  I have reviewed the patients home medications and made adjustments as needed  Final Clinical Impression(s) / ED Diagnoses Final diagnoses:  Left hip pain  Muscle strain    Rx / DC Orders ED Discharge Orders          Ordered    cyclobenzaprine (FLEXERIL) 5 MG tablet  2 times daily PRN        12/29/21 1037    lidocaine (LIDODERM) 5 %  Every 24 hours        12/29/21 1037              Fransico Meadow, MD 12/29/21  1045

## 2021-12-29 NOTE — ED Notes (Signed)
Pt stated she is in pain and never received any pain meds. Triage nurse Ysidro Evert, RN) notified.

## 2021-12-29 NOTE — Discharge Instructions (Addendum)
Today you were seen in the emergency department for your hip pain.    In the emergency department you had x-rays and a CT that did not show any broken bones.    At home, please take Tylenol, lidocaine patches, and use the Flexeril (muscle relaxer) as needed for your left hip pain.   Call your primary doctor in 2-3 days regarding your visit.  Follow-up with your pain doctor in 2 to 3 days regarding your symptoms.  Follow-up with your orthopedic doctor or sports medicine at [facility] about your pain if it persists.  Return immediately to the emergency department if you experience any of the following: Worsening pain, new numbness or weakness, or any other concerning symptoms.    Thank you for visiting our Emergency Department. It was a pleasure taking care of you today.

## 2022-01-06 ENCOUNTER — Telehealth (HOSPITAL_COMMUNITY): Payer: Self-pay | Admitting: Licensed Clinical Social Worker

## 2022-01-06 NOTE — Telephone Encounter (Signed)
H&V Care Navigation CSW Progress Note  Clinical Social Worker received call from pt to request help getting to appt tomorrow.  CSW arranged transport through [facility] taxi.   SDOH Screenings   Food Insecurity: No Food Insecurity (04/10/2021)  Housing: Low Risk  (04/10/2021)  Transportation Needs: No Transportation Needs (01/06/2022)  Alcohol Screen: Low Risk  (05/26/2020)  Depression (PHQ2-9): Low Risk  (12/17/2021)  Financial Resource Strain: Low Risk  (04/10/2021)  Recent Concern: Financial Resource Strain - Medium Risk (01/19/2021)  Physical Activity: Insufficiently Active (04/10/2021)  Social Connections: Moderately Integrated (04/10/2021)  Stress: No Stress Concern Present (04/10/2021)  Tobacco Use: Medium Risk (12/28/2021)    Jorge Ny, LCSW Clinical Social Worker [facility] Desk#: [phone] Cell#: [phone]

## 2022-01-07 ENCOUNTER — Other Ambulatory Visit: Payer: Self-pay

## 2022-01-07 ENCOUNTER — Telehealth: Payer: Self-pay | Admitting: *Deleted

## 2022-01-07 ENCOUNTER — Ambulatory Visit (HOSPITAL_COMMUNITY)
Admission: RE | Admit: 2022-01-07 | Discharge: 2022-01-07 | Disposition: A | Discharge status: Home / Self Care | Payer: Medicare PPO | Source: Ambulatory Visit | Attending: Cardiology | Admitting: Cardiology

## 2022-01-07 ENCOUNTER — Ambulatory Visit (HOSPITAL_BASED_OUTPATIENT_CLINIC_OR_DEPARTMENT_OTHER)
Admission: RE | Admit: 2022-01-07 | Discharge: 2022-01-07 | Disposition: A | Discharge status: Home / Self Care | Payer: Medicare PPO | Source: Ambulatory Visit | Attending: Cardiology | Admitting: Cardiology

## 2022-01-07 ENCOUNTER — Encounter (HOSPITAL_COMMUNITY): Payer: Self-pay | Admitting: Cardiology

## 2022-01-07 VITALS — BP 130/80 | HR 76

## 2022-01-07 DIAGNOSIS — Z7951 Long term (current) use of inhaled steroids: Secondary | ICD-10-CM | POA: Diagnosis not present

## 2022-01-07 DIAGNOSIS — E785 Hyperlipidemia, unspecified: Secondary | ICD-10-CM | POA: Insufficient documentation

## 2022-01-07 DIAGNOSIS — I351 Nonrheumatic aortic (valve) insufficiency: Secondary | ICD-10-CM | POA: Diagnosis not present

## 2022-01-07 DIAGNOSIS — Z8673 Personal history of transient ischemic attack (TIA), and cerebral infarction without residual deficits: Secondary | ICD-10-CM | POA: Diagnosis not present

## 2022-01-07 DIAGNOSIS — M25559 Pain in unspecified hip: Secondary | ICD-10-CM | POA: Insufficient documentation

## 2022-01-07 DIAGNOSIS — E0590 Thyrotoxicosis, unspecified without thyrotoxic crisis or storm: Secondary | ICD-10-CM | POA: Diagnosis not present

## 2022-01-07 DIAGNOSIS — I493 Ventricular premature depolarization: Secondary | ICD-10-CM | POA: Insufficient documentation

## 2022-01-07 DIAGNOSIS — Z952 Presence of prosthetic heart valve: Secondary | ICD-10-CM | POA: Diagnosis not present

## 2022-01-07 DIAGNOSIS — M25552 Pain in left hip: Secondary | ICD-10-CM

## 2022-01-07 DIAGNOSIS — I130 Hypertensive heart and chronic kidney disease with heart failure and stage 1 through stage 4 chronic kidney disease, or unspecified chronic kidney disease: Secondary | ICD-10-CM | POA: Insufficient documentation

## 2022-01-07 DIAGNOSIS — Z7901 Long term (current) use of anticoagulants: Secondary | ICD-10-CM | POA: Diagnosis not present

## 2022-01-07 DIAGNOSIS — I2510 Atherosclerotic heart disease of native coronary artery without angina pectoris: Secondary | ICD-10-CM | POA: Insufficient documentation

## 2022-01-07 DIAGNOSIS — I480 Paroxysmal atrial fibrillation: Secondary | ICD-10-CM | POA: Insufficient documentation

## 2022-01-07 DIAGNOSIS — Z953 Presence of xenogenic heart valve: Secondary | ICD-10-CM | POA: Insufficient documentation

## 2022-01-07 DIAGNOSIS — Z79899 Other long term (current) drug therapy: Secondary | ICD-10-CM | POA: Diagnosis not present

## 2022-01-07 DIAGNOSIS — Z86718 Personal history of other venous thrombosis and embolism: Secondary | ICD-10-CM | POA: Insufficient documentation

## 2022-01-07 DIAGNOSIS — N189 Chronic kidney disease, unspecified: Secondary | ICD-10-CM | POA: Diagnosis not present

## 2022-01-07 DIAGNOSIS — Z853 Personal history of malignant neoplasm of breast: Secondary | ICD-10-CM | POA: Diagnosis not present

## 2022-01-07 DIAGNOSIS — E669 Obesity, unspecified: Secondary | ICD-10-CM | POA: Diagnosis not present

## 2022-01-07 DIAGNOSIS — I5022 Chronic systolic (congestive) heart failure: Secondary | ICD-10-CM | POA: Insufficient documentation

## 2022-01-07 DIAGNOSIS — Z951 Presence of aortocoronary bypass graft: Secondary | ICD-10-CM | POA: Insufficient documentation

## 2022-01-07 DIAGNOSIS — I428 Other cardiomyopathies: Secondary | ICD-10-CM | POA: Diagnosis not present

## 2022-01-07 DIAGNOSIS — I252 Old myocardial infarction: Secondary | ICD-10-CM | POA: Diagnosis not present

## 2022-01-07 DIAGNOSIS — J449 Chronic obstructive pulmonary disease, unspecified: Secondary | ICD-10-CM | POA: Insufficient documentation

## 2022-01-07 LAB — ECHOCARDIOGRAM COMPLETE
AR max vel: 1.13 cm2
AV Area VTI: 1.07 cm2
AV Area mean vel: 1.04 cm2
AV Mean grad: 9 mmHg
AV Peak grad: 17 mmHg
Ao pk vel: 2.06 m/s
Area-P 1/2: 5.28 cm2
S' Lateral: 3.2 cm

## 2022-01-07 LAB — BASIC METABOLIC PANEL
Anion gap: 8 (ref 5–15)
BUN: 17 mg/dL (ref 8–23)
CO2: 20 mmol/L — ABNORMAL LOW (ref 22–32)
Calcium: 10 mg/dL (ref 8.9–10.3)
Chloride: 111 mmol/L (ref 98–111)
Creatinine, Ser: 2.02 mg/dL — ABNORMAL HIGH (ref 0.44–1.00)
GFR, Estimated: 25 mL/min — ABNORMAL LOW (ref >60)
Glucose, Bld: 105 mg/dL — ABNORMAL HIGH (ref 70–99)
Potassium: 5.1 mmol/L (ref 3.5–5.1)
Sodium: 139 mmol/L (ref 135–145)

## 2022-01-07 LAB — BRAIN NATRIURETIC PEPTIDE: B Natriuretic Peptide: 151.4 pg/mL — ABNORMAL HIGH (ref 0.0–100.0)

## 2022-01-07 NOTE — Telephone Encounter (Signed)
   Danielle [name] DOB: [birth year] MRN: [id number]   RIDER WAIVER AND RELEASE OF LIABILITY  For purposes of improving physical access to our facilities, [facility] is pleased to partner with third parties to provide [facility] patients or other authorized individuals the option of convenient, on-demand ground transportation services (the [place]") through use of the technology service that enables users to request on-demand ground transportation from independent third-party providers.  By opting to use and accept these [employer], I, the undersigned, hereby agree on behalf of myself, and on behalf of any minor child using the Government social research officer for whom I am the parent or legal guardian, as follows:  Government social research officer provided to me are provided by independent third-party transportation providers who are not [employer] or employees and who are unaffiliated with [employer]. [facility] is neither a transportation carrier nor a common or public carrier. [facility] has no control over the quality or safety of the transportation that occurs as a result of the [employer]. [facility] cannot guarantee that any third-party transportation provider will complete any arranged transportation service. [facility] makes no representation, warranty, or guarantee regarding the reliability, timeliness, quality, safety, suitability, or availability of any of the Transport Services or that they will be error free. I fully understand that traveling by vehicle involves risks and dangers of serious bodily injury, including permanent disability, paralysis, and death. I agree, on behalf of myself and on behalf of any minor child using the Transport Services for whom I am the parent or legal guardian, that the entire risk arising out of my use of the [employer] remains solely with me, to the maximum extent permitted under applicable law. The [employer] are  provided "as is" and "as available." [facility] disclaims all representations and warranties, express, implied or statutory, not expressly set out in these terms, including the implied warranties of merchantability and fitness for a particular purpose. I hereby waive and release [facility], its agents, employees, officers, directors, representatives, insurers, attorneys, assigns, successors, subsidiaries, and affiliates from any and all past, present, or future claims, demands, liabilities, actions, causes of action, or suits of any kind directly or indirectly arising from acceptance and use of the [employer]. I further waive and release [facility] and its affiliates from all present and future liability and responsibility for any injury or death to persons or damages to property caused by or related to the use of the [employer]. I have read this Waiver and Release of Liability, and I understand the terms used in it and their legal significance. This Waiver is freely and voluntarily given with the understanding that my right (as well as the right of any minor child for whom I am the parent or legal guardian using the [employer]) to legal recourse against [facility] in connection with the [employer] is knowingly surrendered in return for use of these services.   I attest that I read the consent document to Danielle [name], gave [name] the opportunity to ask questions and answered the questions asked (if any). I affirm that Danielle [name] then provided consent for she's participation in this program.     Danielle [name]

## 2022-01-07 NOTE — Progress Notes (Signed)
H&V Care Navigation CSW Progress Note  Clinical Social Worker met with pt to discuss getting help with transport to future appts.  Informed of likely benefit through insurance.  Pt has [facility] banner and is agreeable to consult being placed for them to help her arrange transport to appt on 11/20- order placed.   SDOH Screenings   Food Insecurity: No Food Insecurity (04/10/2021)  Housing: Low Risk  (04/10/2021)  Transportation Needs: No Transportation Needs (01/06/2022)  Alcohol Screen: Low Risk  (05/26/2020)  Depression (PHQ2-9): Low Risk  (12/17/2021)  Financial Resource Strain: Low Risk  (04/10/2021)  Recent Concern: Financial Resource Strain - Medium Risk (01/19/2021)  Physical Activity: Insufficiently Active (04/10/2021)  Social Connections: Moderately Integrated (04/10/2021)  Stress: No Stress Concern Present (04/10/2021)  Tobacco Use: Medium Risk (01/07/2022)   Jorge Ny, LCSW Clinical Social Worker [facility] Desk#: [phone] Cell#: [phone]

## 2022-01-07 NOTE — Telephone Encounter (Signed)
   Telephone encounter was:  Successful.  01/07/2022 Name: Danielle [name] MRN: [id number] DOB: 1946/01/27  Danielle [name] is a 76 y.o. year old female who is a primary care patient of [name], [name], MD . The community resource team was consulted for assistance with Transportation Needs   Care guide performed the following interventions: Patient provided with information about care guide support team and interviewed to confirm resource needs. Provided [facility] Access application and booked transport for 01/28/2022  to the cancer  center through [facility]:  Care guide will follow up with patient by phone over the next [place] [phone] [address] , [place] [address] Email : [name]

## 2022-01-07 NOTE — Progress Notes (Signed)
PCP: [name], [name], MD Cardiology: Dr. Saunders Revel HF cardiology: Dr. Aundra Dubin  76 y.o. with history of aortic insufficiency s/p bioprosthetic aortic valve and CHF was referred by Dr End for evaluation of CHF.  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  Since that time, she has had mild to moderately reduced LV systolic function, as low as 30-35% and as high at 45-50%.  Most recent echo in 9/21 showed EF 45-50%.  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  She has had paroxysmal atrial fibrillation and 2 prior CVAs.  She is on warfarin.  She has had breast cancer treated with lumpectomy and radiation, no chemotherapy.  She has history of hyperthyroidism and is on methimazole.  She has controlled HTN.    Cardiac MRI (10/22) with LV EF 41%, normal RV, subendocardial LGE in the mid-apical inferior wall and the mid-apical inferolateral wall.   Echo in 12/22 was a very technically difficult study with poor images, EF around 35%, RV poorly visualized, the bioprosthetic aortic valve looked ok.    Echo was done today and reviewed, EF up to 50% with mild LVH, RV mildly enlarged with normal systolic function, bioprosthetic aortic valve functioning normally.   She returns for followup of CHF.  She uses a walker in the house for balance.  No dyspnea walking on flat ground for short distances.  No chest pain.  She chronically sleeps on 2 pillows.  Main complaint today is left hip pain, possibly bursitis.  She was seen in the ER, CT of the pelvis did not show any definite abnormality, but she is not able to walk far given hip pain.    Labs (5/22): K 4.9, creatinine 1.21 Labs (9/22): K 4.9, creatinine 1.4 Labs (10/22): K 3.4, creatinine 1.8, TSH normal Labs (8/23): K 4.6, creatinine 1.83 Labs (9/23): K 4.9, creatinine 1.69  ECG (personally reviewed): NSR, 1st degree AVB, PVC, inferior Qs, anterolateral Qs  PMH: 1. Left breast  cancer: Treated with lumpectomy and radiation, no chemotherapy.  2. Hyperthyroidism 3. Aortic insufficiency: Severe, found when she lived in [place].  She had bioprosthetic aortic valve replacement at [facility] in 1/16.  4. Atrial fibrillation: Paroxysmal. 5. CVA x 2 6. HTN 7. Hyperlipidemia 8. Chronic systolic CHF: Nonischemic cardiomyopathy.  - Echo (3/16): EF 45-50% - Echo (1/17): EF 30-35% - Echo (2/18): EF 35-40% - Echo (11/20): EF 35-40% - Echo (9/21): EF 45-50%, global hypokinesis, mild LVH, bioprosthetic aortic valve mean gradient 10 mmHg, normal RV - RHC/LHC (1/22): mean RA 9, PA 35/20, mean PCWP 21, CI 3.2.  Nonobstructive CAD.  - cMRI (10/22): LV EF 41%, normal RV, subendocardial LGE in the mid-apical inferior wall and the mid-apical inferolateral wall. - Echo (12/22): Very technically difficult study with poor images, EF around 35%, RV poorly visualized, the bioprosthetic aortic valve looked ok. - Echo (11/23): EF up to 50% with mild LVH, RV mildly enlarged with normal systolic function, bioprosthetic aortic valve functioning normally.  9. H/o spontaneous PTX  Social History   Socioeconomic History   Marital status: Married    Spouse name: Sherwood   Number of children: 0   Years of education: 12   Highest education level: Bachelor's degree (e.g., BA, AB, BS)  Occupational History   Occupation: Retired in 2004  Tobacco Use   Smoking status: Former    Packs/day: 0.25    Years: 10.00    Total pack years: 2.50  Types: Cigarettes    Quit date: 2012    Years since quitting: 11.8   Smokeless tobacco: Never  Vaping Use   Vaping Use: Never used  Substance and Sexual Activity   Alcohol use: No   Drug use: Never   Sexual activity: Yes    Birth control/protection: Post-menopausal  Other Topics Concern   Not on file  Social History Narrative   Lives with husband.  Ambulated independently.   Right-handed.   No daily caffeine use.   Social Determinants of  Health   Financial Resource Strain: Low Risk  (04/10/2021)   Overall Financial Resource Strain (CARDIA)    Difficulty of Paying Living Expenses: Not very hard  Recent Concern: Financial Resource Strain - Medium Risk (01/19/2021)   Overall Financial Resource Strain (CARDIA)    Difficulty of Paying Living Expenses: Somewhat hard  Food Insecurity: No Food Insecurity (04/10/2021)   Hunger Vital Sign    Worried About Running Out of Food in the Last Year: Never true    Ran Out of Food in the Last Year: Never true  Transportation Needs: No Transportation Needs (01/07/2022)   PRAPARE - Hydrologist (Medical): No    Lack of Transportation (Non-Medical): No  Physical Activity: Insufficiently Active (04/10/2021)   Exercise Vital Sign    Days of Exercise per Week: 1 day    Minutes of Exercise per Session: 10 min  Stress: No Stress Concern Present (04/10/2021)   [place]    Feeling of Stress : Not at all  Social Connections: Moderately Integrated (04/10/2021)   Social Connection and Isolation Panel [NHANES]    Frequency of Communication with Friends and Family: More than three times a week    Frequency of Social Gatherings with Friends and Family: Once a week    Attends Religious Services: More than 4 times per year    Active Member of [employer] or Organizations: No    Attends Music therapist: Not on file    Marital Status: Married  Human resources officer Violence: Not At Risk (05/26/2020)   Humiliation, Afraid, Rape, and Kick questionnaire    Fear of Current or Ex-Partner: No    Emotionally Abused: No    Physically Abused: No    Sexually Abused: No   Family History  Problem Relation Age of Onset   Diabetes Mother    Heart attack Mother 21   Diabetes Father    Lung cancer Father    Diabetes Sister    Thyroid disease Sister    Diabetes Sister    HIV Brother    Colon polyps Neg Hx    Colon  cancer Neg Hx    ROS: All systems reviewed and negative except as per HPI.   Current Outpatient Medications  Medication Sig Dispense Refill   albuterol (PROVENTIL HFA;VENTOLIN HFA) 108 (90 Base) MCG/ACT inhaler Inhale 2 puffs into the lungs every 6 (six) hours as needed for wheezing or shortness of breath.     alendronate (FOSAMAX) 70 MG tablet Take 1 tablet (70 mg total) by mouth once a week. Take with a full glass of water on an empty stomach. 13 tablet 3   apixaban (ELIQUIS) 5 MG TABS tablet Take 1 tablet (5 mg total) by mouth 2 (two) times daily. 180 tablet 3   ascorbic acid (VITAMIN C) 500 MG tablet Take 500 mg by mouth daily.     budesonide-formoterol (SYMBICORT) 160-4.5 MCG/ACT inhaler Inhale  2 puffs into the lungs daily as needed. 3 each 3   carvedilol (COREG) 25 MG tablet Take 1 tablet (25 mg total) by mouth 2 (two) times daily with a meal. 180 tablet 3   Cholecalciferol 25 MCG (1000 UT) tablet Take 1,000 Units by mouth daily.     cyclobenzaprine (FLEXERIL) 5 MG tablet Take 1 tablet (5 mg total) by mouth 2 (two) times daily as needed for muscle spasms. 20 tablet 0   fluticasone (FLONASE) 50 MCG/ACT nasal spray Place 1 spray into both nostrils daily. 16 g 3   lidocaine (LIDODERM) 5 % Place 1 patch onto the skin daily. Remove & Discard patch within 12 hours or as directed by MD 14 patch 0   methimazole (TAPAZOLE) 5 MG tablet Take 3 tablets (15 mg total) by mouth daily. 270 tablet 3   Multiple Vitamin (MULTIVITAMIN) tablet Take 1 tablet by mouth daily.     pantoprazole (PROTONIX) 40 MG tablet Take 1 tablet (40 mg total) by mouth daily. 30 tablet 11   rosuvastatin (CRESTOR) 40 MG tablet Take 1 tablet (40 mg total) by mouth daily. 90 tablet 3   sacubitril-valsartan (ENTRESTO) 49-51 MG Take 1 tablet by mouth 2 (two) times daily. 60 tablet 4   senna-docusate (SENOKOT-S) 8.6-50 MG tablet Take 2 tablets by mouth at bedtime.     spironolactone (ALDACTONE) 25 MG tablet Take 1 tablet (25 mg total)  by mouth daily. 90 tablet 3   torsemide (DEMADEX) 20 MG tablet Take 1 tablet (20 mg total) by mouth as needed (weight gain / swelling).     traMADol (ULTRAM) 50 MG tablet Take 1 tablet (50 mg total) by mouth 3 (three) times daily. 90 tablet 5   trolamine salicylate (ASPERCREME) 10 % cream Apply 1 application topically as needed for muscle pain.     valACYclovir (VALTREX) 500 MG tablet Take on tablet by mouth twice a day for 3 days as needed for an outbreak 30 tablet 0   zolpidem (AMBIEN) 5 MG tablet Take 0.5-1 tablets (2.5-5 mg total) by mouth at bedtime as needed for sleep. 15 tablet 0   No current facility-administered medications for this encounter.   Wt Readings from Last 3 Encounters:  12/28/21 104.8 kg (231 lb)  12/17/21 104.8 kg (231 lb)  11/19/21 101.2 kg (223 lb)   BP 130/80   Pulse 76   SpO2 98%  General: NAD Neck: No JVD, no thyromegaly or thyroid nodule.  Lungs: Clear to auscultation bilaterally with normal respiratory effort. CV: Nondisplaced PMI.  Heart regular S1/S2, no S3/S4, 1/6 SEM RUSB.  No peripheral edema.  No carotid bruit.  Normal pedal pulses.  Abdomen: Soft, nontender, no hepatosplenomegaly, no distention.  Skin: Intact without lesions or rashes.  Neurologic: Alert and oriented x 3.  Psych: Normal affect. Extremities: No clubbing or cyanosis.  HEENT: Normal.   Assessment/Plan: 1. Chronic HF with mid range EF: Nonischemic cardiomyopathy.  Echo in 9/21 showed EF 45-50%, global hypokinesis, mild LVH, bioprosthetic aortic valve mean gradient 10 mmHg, normal RV.  Cause of cardiomyopathy is uncertain, mild CAD on 1/22 cath.  It is possible that this is chronic effect of long-standing AI that was not improved with valve replacement.  Also consider long-standing HTN.  Cardiac MRI in 10/22 showed LV EF 41%, normal RV, subendocardial LGE in the mid-apical inferior wall and the mid-apical inferolateral wall. The LGE pattern does not look like cardiac amyloidosis.  The  subendocardial LGE looks like it came from prior  MI but minimal CAD on cath => ?prior embolism down a coronary related to atrial fibrillation. Echo in 12/22 was a very poor study, EF looked to be around 35%.  Echo was done today and reviewed, EF up to 50% with mild LVH, RV mildly enlarged with normal systolic function, bioprosthetic aortic valve functioning normally.  NYHA class II symptoms but very limited currently by hip pain, hard to bear weight.  - She is using torsemide prn.   - Off Farxiga due to yeast infections.  - Continue Coreg 25 mg bid.   - Continue Entresto 49/51 mg bid.  - Continue spironolactone 25 mg daily. BMET/BNP today.  2. Aortic valve disorders: Per report, had severe AI.  Now s/p bioprosthetic aortic valve replacement.  The valve functions normally on today's echo. - Will need antibiotic prophylaxis with dental work.  3. HTN: BP controlled on current regimen.  4. Atrial fibrillation: Paroxysmal. NSR today.   - Continue apixaban.  5. Obesity: There is no height or weight on file to calculate BMI. - Weight down but unable to tolerate semaglutide.  6. Hip pain: Very limiting, ?bursitis.  - Avoid ibuprofen with CHF.  - [facility] to [facility] for evaluation.   Followup 4 months with APP  [name]  01/07/2022

## 2022-01-07 NOTE — Progress Notes (Signed)
*  PRELIMINARY RESULTS* Echocardiogram 2D Echocardiogram has been performed.  Danielle [name] 01/07/2022, 9:48 AM

## 2022-01-07 NOTE — Patient Instructions (Addendum)
There has been no changes to your medications.  Please avoid Ibuprofen   Labs done today, your results will be available in MyChart, we will contact you for abnormal readings.  You have been referred to [facility] for your hip pain.  Your physician recommends that you schedule a follow-up appointment in: 4 months (March 2024) ** please call the office in January to arrange your follow up appointment **  If you have any questions or concerns before your next appointment please send [place] a message through [place] or call our office at [phone].    TO LEAVE A MESSAGE FOR THE NURSE SELECT OPTION 2, PLEASE LEAVE A MESSAGE INCLUDING: YOUR NAME DATE OF BIRTH CALL BACK NUMBER REASON FOR CALL**this is important as we prioritize the call backs  YOU WILL RECEIVE A CALL BACK THE SAME DAY AS LONG AS YOU CALL BEFORE 4:00 PM  At the [facility], you and your health needs are our priority. As part of our continuing mission to provide you with exceptional heart care, we have created designated Provider Care Teams. These Care Teams include your primary Cardiologist (physician) and Advanced Practice Providers (APPs- Physician Assistants and Nurse Practitioners) who all work together to provide you with the care you need, when you need it.   You may see any of the following providers on your designated Care Team at your next follow up: Dr Glori Bickers Dr [name] Dr. Roxana Hires, NP Lyda Jester, [place] [facility] [place], [place] Forestine Na, NP Audry Riles, PharmD   Please be sure to bring in all your medications bottles to every appointment.

## 2022-01-08 ENCOUNTER — Telehealth (HOSPITAL_COMMUNITY): Payer: Self-pay

## 2022-01-08 ENCOUNTER — Ambulatory Visit: Payer: Medicare PPO | Admitting: Orthopedic Surgery

## 2022-01-08 ENCOUNTER — Other Ambulatory Visit (HOSPITAL_COMMUNITY): Payer: Self-pay

## 2022-01-08 DIAGNOSIS — I5022 Chronic systolic (congestive) heart failure: Secondary | ICD-10-CM

## 2022-01-08 NOTE — Telephone Encounter (Signed)
Patient aware and agreeable. Labs scheduled.

## 2022-01-11 ENCOUNTER — Telehealth: Payer: Self-pay | Admitting: Family Medicine

## 2022-01-11 ENCOUNTER — Ambulatory Visit (INDEPENDENT_AMBULATORY_CARE_PROVIDER_SITE_OTHER): Payer: Medicare PPO | Admitting: Surgery

## 2022-01-11 DIAGNOSIS — M7062 Trochanteric bursitis, left hip: Secondary | ICD-10-CM

## 2022-01-11 DIAGNOSIS — M4726 Other spondylosis with radiculopathy, lumbar region: Secondary | ICD-10-CM

## 2022-01-11 NOTE — Telephone Encounter (Signed)
error 

## 2022-01-11 NOTE — Progress Notes (Signed)
Office Visit Note   Patient: Danielle [name]           Date of Birth: [birth year]           MRN: [id number] Visit Date: 01/11/2022              Requested by: Larey Dresser, MD [phone] [place],  [place] [address] PCP: [name], [name], MD   Assessment & Plan: Visit Diagnoses:  1. Other spondylosis with radiculopathy, lumbar region   2. Greater trochanteric bursitis of left hip     Plan: Advised patient that with her chronic issues regarding lumbar spondylosis, low back pain and left lower extremity radiculopathy recommend that she meet with Dr. Laurance Flatten spine surgeon here in our office to discuss any potential surgical options there.  She will also discuss ongoing conservative management with Dr. Letta Pate.  She can also talk to him about medication management.  All questions answered.  Follow-Up Instructions: Return for  with Dr. Laurance Flatten to discuss surgical options lumbar.   Orders:  No orders of the defined types were placed in this encounter.  No orders of the defined types were placed in this encounter.     Procedures: No procedures performed   Clinical Data: No additional findings.   Subjective: No chief complaint on file.   HPI 76 year old black female comes in with complaints of worsening low back pain and left lower extremity radiculopathy.  I reviewed patient's chart and she has a known history of lumbar spondylosis and has been followed by Dr. Thea Silversmith specialist.  Dr. Providence Lanius ordered a lumbar MRI which was done there were 07/2017 and that showed:  EXAM: MRI LUMBAR SPINE WITHOUT CONTRAST   TECHNIQUE: Multiplanar, multisequence MR imaging of the lumbar spine was performed. No intravenous contrast was administered.   COMPARISON:  None.   FINDINGS: Segmentation:  Standard based on the available coverage.   Alignment:  Negative for listhesis.   Vertebrae:  No fracture, evidence of discitis, or bone lesion.    Conus medullaris and cauda equina: Conus extends to the L1-2 level. Conus and cauda equina appear normal.   Paraspinal and other soft tissues: Tiny presumed cystic intensities in the right renal cortex. There is a partially covered left ovarian cyst measuring 3 cm. A left ovarian cyst with simple CT appearance was seen 08/19/2015; dimensions are stable from that study.   Disc levels:   T12- L1: Unremarkable.   L1-L2: Mild disc narrowing and bulging.  No impingement   L2-L3: Moderate disc narrowing with mild bulging. No visible impingement   L3-L4: Mild disc narrowing and circumferential bulging. No noted impingement.   L4-L5: Severe degenerative disc narrowing with asymmetric left far-lateral ridging. Height loss and ridging causes moderate left foraminal stenosis. Patent canal and right foramen. Mild facet spurring.   L5-S1:Disc narrowing and bulging with a shallow left paracentral protrusion. The left S1 nerve root is posteriorly displaced but there is no compression. Mild left foraminal narrowing. Mild facet spurring.   IMPRESSION: 1. L4-5 advanced disc degeneration with disc collapse and ridging causing moderate left foraminal stenosis. 2. L5-S1 shallow left paracentral disc protrusion with noncompressive mass effect on the descending left S1 nerve root. 3. 3 cm left ovarian cyst, size stable from 2017 pelvis CT.     Electronically Signed   By: Monte Fantasia M.D.   On: 04/06/2017 17:15  Patient states that she has had multiple lumbar spine injections.  She was last seen by him  May 2023 and has an appointment upcoming for left knee issues.  Patient is also status post ORIF left hip in 2017.  Seen in the ED December 28, 2021 left hip and left leg pain.  CT lumbar spine showed:  EXAM: CT Lumbar Spine without contrast   TECHNIQUE: Technique: Multiplanar CT images of the lumbar spine were reconstructed from contemporary CT of the Abdomen and Pelvis.   RADIATION  DOSE REDUCTION: This exam was performed according to the departmental dose-optimization program which includes automated exposure control, adjustment of the mA and/or kV according to patient size and/or use of iterative reconstruction technique.   CONTRAST:  None   COMPARISON:  Lumbar MRI 04/06/2017   FINDINGS: Segmentation: 5 lumbar type vertebrae.   Alignment: Normal.   Vertebrae: Generalized osteopenia. Negative for fracture or focal bone lesion   Paraspinal and other soft tissues: Retroperitoneal findings described separately on dedicated source images of the abdomen   Disc levels:   L1-L2: Disc narrowing with gas containing fissure. Mild endplate and facet spurring. Mild bilateral foraminal narrowing   L2-L3: Disc narrowing with gas containing fissure. Endplate and facet spurring. Patent appearance of the canal and foramina   L3-L4: Disc narrowing and bulging with endplate and facet spurring. Mild spinal and bilateral foraminal stenosis   L4-L5: Disc narrowing with endplate ridging on the left more than right. Asymmetric left facet spurring. Advanced left foraminal stenosis. Moderate right foraminal narrowing   L5-S1:Disc narrowing and bulging with gas containing fissure. Degenerative facet spurring on both sides. Mild bilateral foraminal narrowing.   IMPRESSION: 1. No acute finding. 2. Generalized lumbar spine degeneration as described. Advanced foraminal stenosis on the left at L4-5.     Electronically Signed   By: Jorje Guild M.D.   On: 12/29/2021 10:18  Patient states that she has pain in left low back that radiates into the left buttock/hip and down to her foot.  Numbness and tingling also in the left leg.  No right leg symptoms.  Denies groin pain.  Past medical history reviewed.  Patient followed by cardiology.  Also on chronic process. Review of Systems No current complaints of cardiopulmonary GI/GU issues  Objective: Vital Signs: There were no  vitals taken for this visit.  Physical Exam Constitutional:      Appearance: She is obese.  HENT:     Nose: Nose normal.  Eyes:     Extraocular Movements: Extraocular movements intact.  Pulmonary:     Effort: No respiratory distress.  Musculoskeletal:     Comments: Positive left-sided lumbar paraspinal tenderness/spasm.  Moderate tenderness over the left SI joint and left hip greater trochanter bursa.  Negative on the right side.  No focal motor deficits.  Neurological:     Mental Status: She is alert.  Psychiatric:        Mood and Affect: Mood normal.     Ortho Exam  Specialty Comments:  No specialty comments available.  Imaging: No results found.   PMFS History: Patient Active Problem List   Diagnosis Date Noted   Chronic pain of both shoulders 12/17/2021   Depression, major, single episode, moderate ([place]) 12/08/2020   Gait abnormality 07/31/2020   Paresthesia 07/31/2020   Low back pain with bilateral sciatica 07/31/2020   Pneumonia due to COVID-19 virus    Bacteremia due to Gram-positive bacteria 08/29/2018   Abscess    Cellulitis of left breast 08/28/2018   Cellulitis of left abdominal wall 08/28/2018   Encounter for routine gynecological examination 04/28/2018  Stage 3 chronic kidney disease ([place]) 09/27/2017   Recurrent genital herpes 04/13/2017   Chronic pain disorder 01/25/2017   Malignant neoplasm of upper-outer quadrant of left breast in female, estrogen receptor positive ([place]) 12/16/2016   Chronic knee pain 12/07/2016   Chronic bilateral low back pain without sciatica 12/07/2016   Peripheral musculoskeletal gait disorder 12/07/2016   Encounter for therapeutic drug monitoring 06/16/2016   Generalized osteoarthritis of multiple sites 05/04/2016   Aortic valve disease 04/10/2016   S/P AVR 04/10/2016   NICM (nonischemic cardiomyopathy) ([place]) 04/10/2016   Upper airway cough syndrome 03/14/2016   Class 2 severe obesity with serious comorbidity in adult  [facility]) 03/14/2016   COPD (chronic obstructive pulmonary disease) ([place]) 03/11/2016   Thrombocytopenia ([place]) 12/12/2015   Anemia 12/12/2015   Vitamin D deficiency 12/12/2015   Acute urinary retention 12/11/2015   Osteoporosis 12/11/2015   Hip fracture ([place]) 12/10/2015   Aortic atherosclerosis ([place]) 12/10/2015   Lung blebs ([place]) 12/03/2015   Pelvic fracture ([place]) 08/19/2015   Fall at home 08/19/2015   Essential hypertension 08/19/2015   Hyperlipidemia LDL goal <70 08/19/2015   Asthma 08/19/2015   Paroxysmal atrial fibrillation ([place]) 08/19/2015   Hyperthyroidism 08/19/2015   Chronic HFrEF (heart failure with reduced ejection fraction) ([place])    Coronary artery disease    Aortic stenosis    Past Medical History:  Diagnosis Date   Anticoagulated on Coumadin    managed by cardiology   Anxiety    Chronic constipation    Chronic systolic CHF (congestive heart failure) ([place]) 2016   cardiologist--- dr end and followed by CHF clinic   CKD (chronic kidney disease), stage III ([place])    COPD (chronic obstructive pulmonary disease) ([place])    previous had seen pulmonology --- dr Melvyn Novas, note in epic 03-14-2016, dx COPD GOLD 0   Coronary artery disease    cardiologist--- dr Durward Mallard;  last cardiac cath 01-10-20202 mild nonobstructive disease proxLAD;  per pt, had LHC prior to AVR in [place] 2016   DOE (dyspnea on exertion)    GERD (gastroesophageal reflux disease)    History of 2019 novel coronavirus disease (COVID-19) 12/19/2019   hospital admission w/ covid pneumonia , no intubatiion,  per pt symptoms resolved and back to baseline   History of cerebrovascular accident (CVA) with residual deficit    CVA in 2000 without residuals;  CVA post op AVR 01/ 2016 with residual right hand weakness   History of gastritis    History of pneumothorax    s/p right vats 11-19-2014 and  s/p left vats  12-13-2015   (both spontenaous due to bleb)   Hyperlipidemia    Hypertension    Hyperthyroidism    per pt followed  by pcp,  dx approx 2014   Malignant neoplasm of upper-outer quadrant of left breast in female, estrogen receptor positive ([place]) 11/2016   oncologist-- dr Burr Medico--- dx 10/ 2018  ,  s/p left lumptectomy with node dissection;  completed radiation 04-15-2017, no chemo   Multiple thyroid nodules    in care everywhere pt had left thyroid nodule biospy 09-18-2014 benign   NICM (nonischemic cardiomyopathy) ([place]) 2016   2016  ef 30%;  last echo in epic ef 45%   OA (osteoarthritis)    Osteoporosis    Paroxysmal atrial fibrillation [facility])    cardiologist--- dr end   Personal history of radiation therapy    left breast cancer  03-08-2017  to 04-05-2017   Thrombocytopenia [facility])    followed by dr  feng   Valvular stenosis, aortic 03/12/2014   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   Wears dentures    full upper    Family History  Problem Relation Age of Onset   Diabetes Mother    Heart attack Mother 70   Diabetes Father    Lung cancer Father    Diabetes Sister    Thyroid disease Sister    Diabetes Sister    HIV Brother    Colon polyps Neg Hx    Colon cancer Neg Hx     Past Surgical History:  Procedure Laterality Date   AORTIC VALVE REPLACEMENT  03/12/2014   [facility] in [place];  St Jude 15m, medial Trifecta Bioprosthesis   BREAST LUMPECTOMY WITH RADIOACTIVE SEED AND SENTINEL LYMPH NODE BIOPSY Left 01/14/2017   Procedure: LEFT BREAST LUMPECTOMY WITH RADIOACTIVE SEED AND SENTINEL LYMPH NODE BIOPSY;  Surgeon: WRolm Bookbinder MD;  [place]  Service: General;  Laterality: Left;   CARDIAC CATHETERIZATION  02/12/2014   [facility] [place] 11/17/2020   Procedure: DILATATION AND CURETTAGE /HYSTEROSCOPY WITH MYOSURE;  Surgeon: ANunzio Cobbs MD;  [place]  Service: Gynecology;  Laterality: N/A;   INTRAMEDULLARY (IM) NAIL INTERTROCHANTERIC Left 12/10/2015   Procedure: INTRAMEDULLARY  (IM) NAIL INTERTROCHANTRIC;  Surgeon: NLeandrew Koyanagi MD;  [place]  Service: Orthopedics;  Laterality: Left;   OPERATIVE ULTRASOUND N/A 11/17/2020   Procedure: OPERATIVE ULTRASOUND;  Surgeon: ANunzio Cobbs MD;  [facility]  Service: Gynecology;  Laterality: N/A;   PLEURADESIS Left 12/03/2015   Procedure: PLEURADESIS;  Surgeon: SMelrose Nakayama MD;  [place]  Service: Thoracic;  Laterality: Left;   RESECTION OF APICAL BLEB Left 12/03/2015   Procedure: BLEBECTOMY;  Surgeon: SMelrose Nakayama MD;  [place]  Service: Thoracic;  Laterality: Left;   RIGHT/LEFT HEART CATH AND CORONARY ANGIOGRAPHY N/A 03/10/2020   Procedure: RIGHT/LEFT HEART CATH AND CORONARY ANGIOGRAPHY;  Surgeon: ENelva Bush MD;  [place] [place];  Service: Cardiovascular;  Laterality: N/A;   VIDEO ASSISTED THORACOSCOPY Left 12/03/2015   Procedure: VIDEO ASSISTED THORACOSCOPY;  Surgeon: SMelrose Nakayama MD;  [place]  Service: Thoracic;  Laterality: Left;   VIDEO ASSISTED THORACOSCOPY (VATS) W/TALC PLEUADESIS Right 11/19/2014   [place]  Occupational History   Occupation: Retired in 2004  Tobacco Use   Smoking status: Former    Packs/day: 0.25    Years: 10.00    Total pack years: 2.50    Types: Cigarettes    Quit date: 2012    Years since quitting: 11.8   Smokeless tobacco: Never  Vaping Use   Vaping Use: Never used  Substance and Sexual Activity   Alcohol use: No   Drug use: Never   Sexual activity: Yes    Birth control/protection: Post-menopausal

## 2022-01-14 ENCOUNTER — Telehealth (HOSPITAL_COMMUNITY): Payer: Self-pay | Admitting: Pharmacy Technician

## 2022-01-14 NOTE — Telephone Encounter (Addendum)
Advanced Heart Failure Patient Advocate Encounter  Patient left vm requesting assistance for Eliquis renewal for BMS. Called and spoke with the patient. We cannot apply for BMS assistance until 3% OOP has been spent for 2024. Advised patient the amount she had to spend this year in order to obtain assistance. She will call back once she has gotten close to that mark.  [name], CPhT

## 2022-01-18 ENCOUNTER — Telehealth: Payer: Self-pay | Admitting: Pharmacist

## 2022-01-18 ENCOUNTER — Ambulatory Visit
Admission: RE | Admit: 2022-01-18 | Discharge: 2022-01-18 | Disposition: A | Discharge status: Home / Self Care | Payer: Medicare PPO | Source: Ambulatory Visit | Attending: Hematology | Admitting: Hematology

## 2022-01-18 ENCOUNTER — Telehealth (HOSPITAL_COMMUNITY): Payer: Self-pay | Admitting: Licensed Clinical Social Worker

## 2022-01-18 DIAGNOSIS — Z1231 Encounter for screening mammogram for malignant neoplasm of breast: Secondary | ICD-10-CM | POA: Diagnosis not present

## 2022-01-18 DIAGNOSIS — Z170 Estrogen receptor positive status [ER+]: Secondary | ICD-10-CM

## 2022-01-18 NOTE — Chronic Care Management (AMB) (Signed)
    Chronic Care Management Pharmacy Assistant   Name: Danielle [name]  MRN: [id number] DOB: 1946-01-07  01/19/2022 APPOINTMENT REMINDER  [name] was reminded to have all medications, supplements and any blood glucose and blood pressure readings available for review with [name], Pharm. D, at her telephone visit on 01/19/2022 at 10:00.  Patient requested to be rescheduled do to another appointment at 9:30, patient was rescheduled to 01/25/2022  Care Gaps: AWV - scheduled 02/01/2022 Last BP - 103/60 on 12/17/2021 Covid - overdue Flu - due AWV - due soon  Star Rating Drug: Rosuvastatin 40 mg - last filled 01/13/2022 90 DS at [facility]  Any gaps in medications fill history? No  [name] [facility]  Catering manager [phone]

## 2022-01-18 NOTE — Telephone Encounter (Signed)
01/18/2022  Mika Griffitts DOB: 1945-11-03 MRN: [id number]   RIDER WAIVER AND RELEASE OF LIABILITY  For the purposes of helping with transportation needs, [facility] partners with outside transportation providers (taxi companies, [place], Social research officer, government.) to give [employer] patients or other approved people the choice of on-demand rides [employer]") to our buildings for non-emergency visits.  By using [employer], I, the person signing this document, on behalf of myself and/or any legal minors (in my care using the [employer]), agree:  Government social research officer given to me are supplied by independent, outside transportation providers who do not work for, or have any affiliation with, [employer]. [facility] is not a transportation company. [facility] has no control over the quality or safety of the rides I get using [employer]. [place] has no control over whether any outside ride will happen on time or not. [facility] gives no guarantee on the reliability, quality, safety, or availability on any rides, or that no mistakes will happen. I know and accept that traveling by vehicle (car, truck, SVU, Lucianne Lei, bus, taxi, etc.) has risks of serious injuries such as disability, being paralyzed, and death. I know and agree the risk of using [employer] is mine alone, and not [employer]. Transport Services are provided "as is" and as are available. The transportation providers are in charge for all inspections and care of the vehicles used to provide these rides. I agree not to take legal action against [facility], its agents, employees, officers, directors, representatives, insurers, attorneys, assigns, successors, subsidiaries, and affiliates at any time for any reasons related directly or indirectly to using [employer]. I also agree not to take legal action against [place] or its affiliates for any injury, death, or damage to property caused by or  related to using [employer]. I have read this Waiver and Release of Liability, and I understand the terms used in it and their legal meaning. This Waiver is freely and voluntarily given with the understanding that my right (or any legal minors) to legal action against [place] relating to [employer] is knowingly given up to use these services.   I attest that I read the Ride Waiver and Release of Liability to [name], gave [name] the opportunity to ask questions and answered the questions asked (if any). I affirm that [name] then provided consent for assistance with transportation.     Jorge Ny

## 2022-01-18 NOTE — Telephone Encounter (Signed)
H&V Care Navigation CSW Progress Note  Clinical Social Worker received call from pt requesting help with ride to lab appt tomorrow.  CSW able to arranged taxi to bring pt to appt. Waiver to be signed during appt.   SDOH Screenings   Food Insecurity: No Food Insecurity (04/10/2021)  Housing: Low Risk  (04/10/2021)  Transportation Needs: No Transportation Needs (01/07/2022)  Alcohol Screen: Low Risk  (05/26/2020)  Depression (PHQ2-9): Low Risk  (12/17/2021)  Financial Resource Strain: Low Risk  (04/10/2021)  Recent Concern: Financial Resource Strain - Medium Risk (01/19/2021)  Physical Activity: Insufficiently Active (04/10/2021)  Social Connections: Moderately Integrated (04/10/2021)  Stress: No Stress Concern Present (04/10/2021)  Tobacco Use: Medium Risk (01/07/2022)    Jorge Ny, LCSW Clinical Social Worker Advanced [facility] Failure Clinic Desk#: [phone] Cell#: [phone]

## 2022-01-19 ENCOUNTER — Ambulatory Visit (HOSPITAL_COMMUNITY)
Admission: RE | Admit: 2022-01-19 | Discharge: 2022-01-19 | Disposition: A | Discharge status: Home / Self Care | Payer: Medicare PPO | Source: Ambulatory Visit | Attending: Cardiology | Admitting: Cardiology

## 2022-01-19 ENCOUNTER — Telehealth: Payer: Medicare PPO

## 2022-01-19 DIAGNOSIS — I5022 Chronic systolic (congestive) heart failure: Secondary | ICD-10-CM | POA: Insufficient documentation

## 2022-01-19 LAB — BASIC METABOLIC PANEL
Anion gap: 8 (ref 5–15)
BUN: 25 mg/dL — ABNORMAL HIGH (ref 8–23)
CO2: 22 mmol/L (ref 22–32)
Calcium: 10.3 mg/dL (ref 8.9–10.3)
Chloride: 110 mmol/L (ref 98–111)
Creatinine, Ser: 1.62 mg/dL — ABNORMAL HIGH (ref 0.44–1.00)
GFR, Estimated: 33 mL/min — ABNORMAL LOW (ref >60)
Glucose, Bld: 101 mg/dL — ABNORMAL HIGH (ref 70–99)
Potassium: 5.5 mmol/L — ABNORMAL HIGH (ref 3.5–5.1)
Sodium: 140 mmol/L (ref 135–145)

## 2022-01-20 ENCOUNTER — Telehealth: Payer: Self-pay | Admitting: Pharmacist

## 2022-01-20 ENCOUNTER — Telehealth (HOSPITAL_COMMUNITY): Payer: Self-pay | Admitting: *Deleted

## 2022-01-20 DIAGNOSIS — I5022 Chronic systolic (congestive) heart failure: Secondary | ICD-10-CM

## 2022-01-20 MED ORDER — SPIRONOLACTONE 25 MG PO TABS: 12.5000 mg | ORAL_TABLET | Freq: Every day | ORAL | 3 refills | Status: DC

## 2022-01-20 NOTE — Chronic Care Management (AMB) (Signed)
    Chronic Care Management Pharmacy Assistant   Name: Danielle [name]  MRN: [id number] DOB: 29-Mar-1945  01/25/2022 APPOINTMENT REMINDER  [name] was reminded to have all medications, supplements and any blood glucose and blood pressure readings available for review with [name], Pharm. D, at her telephone visit on 01/25/2022 at 11:45.  Care Gaps: AWV - scheduled 02/01/2022 Last BP - 103/60 on 12/17/2021 Covid - overdue Flu - due AWV - overdue  Star Rating Drug: Rosuvastatin 40 mg - last filled 01/13/2022 90 DS at [facility]   Any gaps in medications fill history? No  [name] [facility]  Catering manager [phone]

## 2022-01-20 NOTE — Telephone Encounter (Signed)
Called patient per Dr. Aundra Dubin; based on lab results from clinic visit on 01/19/22, instructed patient to do the following:  K still high Hold spironolactone x 1 day then decrease to 12.5 mg daily.   BMET in 1 week on 01/28/22 at 12:30.  Pt verbalized understanding of same.  Zada Girt RN

## 2022-01-24 NOTE — Progress Notes (Signed)
Chronic Care Management Pharmacy Note  01/25/2022 Name:  Danielle [name] MRN:  [id number] DOB:  04/23/1945  Summary: BP at goal < 140/90 per home readings Pt is not taking alendronate as prescribed per pharmacy fill history and DEXA worsened  Recommendations/Changes made from today's visit: -Recommend switching alendronate to Prolia due to ease of administration and DEXA worsening -Recommended continued BP monitoring at home and daily weights -Recommended calcium citrate 500-600 mg supplementation daily -Mailed Entresto PAP re-enrollment   Plan: Schedule CPE with PCP HF assessment in 2 months Follow up in 6 months   Subjective: Danielle [name] is an 76 y.o. year old female who is a primary patient of [name], [name], MD.  The CCM team was consulted for assistance with disease management and care coordination needs.    Engaged with patient by telephone for follow up visit in response to provider referral for pharmacy case management and/or care coordination services.   Consent to Services:  The patient was given information about Chronic Care Management services, agreed to services, and gave verbal consent prior to initiation of services.  Please see initial visit note for detailed documentation.   Patient Care Team: [name], [name], MD as PCP - General (Family Medicine) End, Harrell Gave, MD as PCP - Cardiology (Cardiology) [name], MD as Consulting Physician (Hematology) Kyung Rudd, MD as Consulting Physician (Radiation Oncology) Rolm Bookbinder, MD as Consulting Physician (General Surgery) Delice Bison, Charlestine Massed, NP as Nurse Practitioner (Hematology and Oncology) Viona Gilmore, [facility] as Pharmacist (Pharmacist) Alla Feeling, NP as Nurse Practitioner (Nurse Practitioner)  Recent office visits: 08/28/2021 [name] - Patient was seen for Hypertensive heart disease with heart failure and additional concerns. No additional chart  notes.   Recent consult visits: 01/11/22 Benjiman Core, PA-C (ortho): Patient presented for spondylosis with radiculopathy follow up. Follow up with Dr. Laurance Flatten to discuss surgical options.  01/07/22 [name], MD (cardiology): Patient presented for HF follow up. Follow up in 4 months.  12/17/2021 [name] MD (Physical Med) - Patient was seen for Chronic pain of both shoulders. No medication changes. Follow up in 4 weeks.    11/19/2021 [name] MD (oncology) - Patient was seen for Malignant neoplasm of upper-outer quadrant of left breast in female, estrogen receptor positive. No medication changes. No follow up noted.    10/27/2021 [name] MD (Physical Med) - Patient was seen for Chronic pain of both shoulders. No medication changes. Follow up in 6 weeks.    10/06/2021 [name] NP (heartcare) - Patient was seen for Preop cardiovascular exam. No medication changes. No follow up noted.    09/17/2021 [name] MD (obgyn) - Patient was seen for postmenopausal bleeding. No medication changes. No follow up noted.    07/28/2021 [name] MD (Physical Med) - Patient was seen for Primary osteoarthritis of both knees and additional concerns. No medication changes. Follow up in 3 months.   Hospital visits: 10/30-10/31/23 Patient presented to [facility] ED for left hip pain. Prescribed lidocaine patches and cyclobenzaprine.  Objective:  Lab Results  Component Value Date   CREATININE 1.62 (H) 01/19/2022   BUN 25 (H) 01/19/2022   GFR 33.40 (L) 04/13/2021   GFRNONAA 33 (L) 01/19/2022   GFRAA 57 (L) 02/27/2020   NA 140 01/19/2022   K 5.5 (H) 01/19/2022   CALCIUM 10.3 01/19/2022   CO2 22 01/19/2022   GLUCOSE 101 (H) 01/19/2022    Lab Results  Component Value Date/Time  HGBA1C 5.2 05/04/2021 11:56 AM   HGBA1C 5.1 12/08/2020 09:27 AM   HGBA1C 5.3 07/31/2020 10:48 AM   HGBA1C 5.0 08/28/2018 05:25 PM   GFR 33.40 (L) 04/13/2021 10:04 AM    GFR 57.35 (L) 01/04/2018 12:32 PM   MICROALBUR 1.0 07/07/2020 09:41 AM   MICROALBUR 1.9 01/04/2018 12:32 PM    Last diabetic Eye exam: No results found for: "HMDIABEYEEXA"  Last diabetic Foot exam: No results found for: "HMDIABFOOTEX"   Lab Results  Component Value Date   CHOL 142 02/10/2021   HDL 63 02/10/2021   LDLCALC 67 02/10/2021   TRIG 59 02/10/2021   CHOLHDL 2.3 02/10/2021       Latest Ref Rng & Units 11/19/2021    9:37 AM 11/21/2020    9:34 AM 10/29/2020   10:10 AM  Hepatic Function  Total Protein 6.5 - 8.1 g/dL 6.9  6.6  7.3   Albumin 3.5 - 5.0 g/dL 4.1  3.4  3.8   AST 15 - 41 U/L _0 ALT 0 - 44 U/L _1 Alk Phosphatase 38 - 126 U/L 56  46  50   Total Bilirubin 0.3 - 1.2 mg/dL 0.6  0.4  0.4     Lab Results  Component Value Date/Time   TSH 0.63 04/13/2021 10:04 AM   TSH 1.146 12/17/2020 10:40 AM   TSH 0.53 12/08/2020 09:26 AM   FREET4 0.91 01/29/2019 12:32 PM   FREET4 0.80 06/14/2017 03:24 PM       Latest Ref Rng & Units 11/19/2021    9:37 AM 11/21/2020    9:34 AM 11/17/2020    6:04 AM  CBC  WBC 4.0 - 10.5 K/uL 5.0  6.3    Hemoglobin 12.0 - 15.0 g/dL 12.2  10.9  12.9   Hematocrit 36.0 - 46.0 % 36.3  33.6  38.0   Platelets 150 - 400 K/uL 58  70      Lab Results  Component Value Date/Time   VD25OH 39.35 07/02/2020 10:29 AM   VD25OH 39.12 01/29/2019 12:32 PM    Clinical ASCVD: Yes  The 10-year ASCVD risk score (Arnett DK, et al., 2019) is: 12.9%   Values used to calculate the score:     Age: 72 years     Sex: Female     Is Non-Hispanic African American: Yes     Diabetic: No     Tobacco smoker: No     Systolic Blood Pressure: 409 mmHg     Is BP treated: Yes     HDL Cholesterol: 63 mg/dL     Total Cholesterol: 142 mg/dL       12/17/2021   10:36 AM 10/27/2021    9:35 AM 07/28/2021    9:52 AM  Depression screen PHQ 2/9  Decreased Interest 0 0 0  Down, Depressed, Hopeless 0 0 0  PHQ - 2 Score 0 0 0     CHA2DS2/VAS Stroke Risk  Points  Current as of 9 minutes ago     7 >= 2 Points: High Risk  1 - 1.99 Points: Medium Risk  0 Points: Low Risk    Last Change: N/A      Details    This score determines the patient's risk of having a stroke if the  patient has atrial fibrillation.       Points Metrics  1 Has Congestive Heart Failure:  Yes    Current as of 9 minutes  ago  1 Has Vascular Disease:  Yes    Current as of 9 minutes ago  1 Has Hypertension:  Yes    Current as of 9 minutes ago  1 Age:  61    Current as of 9 minutes ago  0 Has Diabetes:  No    Current as of 9 minutes ago  2 Had Stroke:  Yes  Had TIA:  No  Had Thromboembolism:  No    Current as of 9 minutes ago  1 Female:  Yes    Current as of 9 minutes ago      Social History   Tobacco Use  Smoking Status Former   Packs/day: 0.25   Years: 10.00   Total pack years: 2.50   Types: Cigarettes   Quit date: 2012   Years since quitting: 11.9  Smokeless Tobacco Never   BP Readings from Last 3 Encounters:  01/07/22 130/80  12/29/21 105/78  12/17/21 103/60   Pulse Readings from Last 3 Encounters:  01/07/22 76  12/29/21 85  12/17/21 73   Wt Readings from Last 3 Encounters:  12/28/21 231 lb (104.8 kg)  12/17/21 231 lb (104.8 kg)  11/19/21 223 lb (101.2 kg)   BMI Readings from Last 3 Encounters:  12/28/21 36.18 kg/m  12/17/21 36.18 kg/m  11/19/21 34.93 kg/m    Assessment/Interventions: Review of patient past medical history, allergies, medications, health status, including review of consultants reports, laboratory and other test data, was performed as part of comprehensive evaluation and provision of chronic care management services.   SDOH:  (Social Determinants of Health) assessments and interventions performed: Yes  SDOH Interventions    Flowsheet Row Chronic Care Management from 01/25/2022 in [place] [facility] [place] from 01/18/2022 in [place] Established CHF 20  from 01/07/2022 in [place] Telephone from 01/06/2022 in [place] Office Visit from 04/13/2021 in [place] [facility] [place] from 12/08/2020 in [place] [facility] [place] Interventions        Transportation Interventions -- Taxi Voucher Given Ambulatory [phone] Order Taxi Voucher Given -- --  Depression Interventions/Treatment  -- -- -- -- Medication Medication  Financial Strain Interventions Other (Comment)  [working on patient assistance re-enrollment] -- -- -- -- --      SDOH Screenings   Food Insecurity: No Food Insecurity (04/10/2021)  Housing: Low Risk  (04/10/2021)  Transportation Needs: No Transportation Needs (01/07/2022)  Alcohol Screen: Low Risk  (05/26/2020)  Depression (PHQ2-9): Low Risk  (12/17/2021)  Financial Resource Strain: Medium Risk (01/25/2022)  Physical Activity: Insufficiently Active (04/10/2021)  Social Connections: Moderately Integrated (04/10/2021)  Stress: No Stress Concern Present (04/10/2021)  Tobacco Use: Medium Risk (01/18/2022)    CCM Care Plan  Allergies  Allergen Reactions   Lisinopril Cough   Tetanus Toxoid Adsorbed Swelling    Arm swelling   Other Cough    PEANUTS   Peanut Allergen Powder-Dnfp Cough    Medications Reviewed Today     Reviewed by Viona Gilmore, [facility] (Pharmacist) on 01/25/22 at 1201  Med List Status: <None>   Medication Order Taking? Sig Documenting Provider Last Dose Status Informant  albuterol (PROVENTIL HFA;VENTOLIN HFA) 108 (90 Base) MCG/ACT inhaler [id number]  Inhale 2 puffs into the lungs every 6 (six) hours as needed for wheezing or shortness of breath. [provider]  Active Self  alendronate (FOSAMAX) 70 MG tablet [id number]  Take 1 tablet (70 mg total) by mouth once a week. Take with a full glass of water on an empty stomach. [name], [name], MD  Active            Med Note ([place],  [place] Rocker   Wed Jul 23, 2020  9:08 AM)    apixaban (ELIQUIS) 5 MG TABS tablet [id number]  Take 1 tablet (5 mg total) by mouth 2 (two) times daily. Larey Dresser, MD  Active   ascorbic acid (VITAMIN C) 500 MG tablet [id number]  Take 500 mg by mouth daily. [provider]  Active   budesonide-formoterol [facility]) 160-4.5 MCG/ACT inhaler [id number]  Inhale 2 puffs into the lungs daily as needed. [name], [name], MD  Active   carvedilol (COREG) 25 MG tablet [id number]  Take 1 tablet (25 mg total) by mouth 2 (two) times daily with a meal. Larey Dresser, MD  Active   Cholecalciferol 25 MCG (1000 UT) tablet [id number] Yes Take 1,000 Units by mouth daily. [provider] Taking Active Self  cyclobenzaprine (FLEXERIL) 5 MG tablet [id number]  Take 1 tablet (5 mg total) by mouth 2 (two) times daily as needed for muscle spasms. Fransico Meadow, MD  Active   fluticasone [facility]) 50 MCG/ACT nasal spray [id number]  Place 1 spray into both nostrils daily. [name], [name], MD  Active   lidocaine (LIDODERM) 5 % [id number]  Place 1 patch onto the skin daily. Remove & Discard patch within 12 hours or as directed by MD Fransico Meadow, MD  Active   methimazole (TAPAZOLE) 5 MG tablet [id number]  Take 3 tablets (15 mg total) by mouth daily. [name], [name], MD  Active   Multiple Vitamin (MULTIVITAMIN) tablet [id number] Yes Take 1 tablet by mouth daily. [provider] Taking Active   pantoprazole (PROTONIX) 40 MG tablet [id number]  Take 1 tablet (40 mg total) by mouth daily. Larey Dresser, MD  Active Self  rosuvastatin (CRESTOR) 40 MG tablet [id number]  Take 1 tablet (40 mg total) by mouth daily. Larey Dresser, MD  Active   sacubitril-valsartan [facility]) 49-51 MG [id number]  Take 1 tablet by mouth 2 (two) times daily. [place], [place], [place]  Active   senna-docusate (SENOKOT-S) 8.6-50 MG tablet [id number]  Take 2 tablets by mouth at bedtime. [provider]  Active Self   spironolactone (ALDACTONE) 25 MG tablet [id number]  Take 0.5 tablets (12.5 mg total) by mouth daily. Larey Dresser, MD  Active   torsemide (DEMADEX) 20 MG tablet [id number]  Take 1 tablet (20 mg total) by mouth as needed (weight gain / swelling). End, Harrell Gave, MD  Active   traMADol (ULTRAM) 50 MG tablet [id number]  Take 1 tablet (50 mg total) by mouth 3 (three) times daily. [name], MD  Active            Med Note [facility]   Thu Jan 07, 2022 10:00 AM)    trolamine salicylate (ASPERCREME) 10 % cream [id number]  Apply 1 application topically as needed for muscle pain. [provider]  Active Self  valACYclovir (VALTREX) 500 MG tablet [id number]  Take on tablet by mouth twice a day for 3 days as needed for an outbreak Nunzio Cobbs, MD  Active Self  zolpidem (AMBIEN) 5 MG tablet [id number]  Take 0.5-1 tablets (2.5-5 mg total) by mouth at bedtime as needed for sleep. [name], [name], MD  Active  Patient Active Problem List   Diagnosis Date Noted   Chronic pain of both shoulders 12/17/2021   Depression, major, single episode, moderate ([place]) 12/08/2020   Gait abnormality 07/31/2020   Paresthesia 07/31/2020   Low back pain with bilateral sciatica 07/31/2020   Pneumonia due to COVID-19 virus    Bacteremia due to Gram-positive bacteria 08/29/2018   Abscess    Cellulitis of left breast 08/28/2018   Cellulitis of left abdominal wall 08/28/2018   Encounter for routine gynecological examination 04/28/2018   Stage 3 chronic kidney disease ([place]) 09/27/2017   Recurrent genital herpes 04/13/2017   Chronic pain disorder 01/25/2017   Malignant neoplasm of upper-outer quadrant of left breast in female, estrogen receptor positive ([place]) 12/16/2016   Chronic knee pain 12/07/2016   Chronic bilateral low back pain without sciatica 12/07/2016   Peripheral musculoskeletal gait disorder 12/07/2016   Encounter for therapeutic drug monitoring 06/16/2016    Generalized osteoarthritis of multiple sites 05/04/2016   Aortic valve disease 04/10/2016   S/P AVR 04/10/2016   NICM (nonischemic cardiomyopathy) ([place]) 04/10/2016   Upper airway cough syndrome 03/14/2016   Class 2 severe obesity with serious comorbidity in adult ([place]) 03/14/2016   COPD (chronic obstructive pulmonary disease) ([place]) 03/11/2016   Thrombocytopenia ([place]) 12/12/2015   Anemia 12/12/2015   Vitamin D deficiency 12/12/2015   Acute urinary retention 12/11/2015   Osteoporosis 12/11/2015   Hip fracture ([place]) 12/10/2015   Aortic atherosclerosis ([place]) 12/10/2015   Lung blebs ([place]) 12/03/2015   Pelvic fracture ([place]) 08/19/2015   Fall at home 08/19/2015   Essential hypertension 08/19/2015   Hyperlipidemia LDL goal <70 08/19/2015   Asthma 08/19/2015   Paroxysmal atrial fibrillation ([place]) 08/19/2015   Hyperthyroidism 08/19/2015   Chronic HFrEF (heart failure with reduced ejection fraction) ([place])    Coronary artery disease    Aortic stenosis     Immunization History  Administered Date(s) Administered   Fluad Quad(high Dose 65+) 11/20/2018, 12/08/2020   Influenza, High Dose Seasonal PF 11/24/2016, 12/08/2017   Influenza-Unspecified 12/07/2015, 11/09/2019   Moderna SARS-COV2 Booster Vaccination 02/28/2020   Moderna Sars-Covid-2 Vaccination 03/24/2019, 04/28/2019, 07/02/2020   Pneumococcal Conjugate-13 11/13/2013, 11/21/2014, 12/15/2018   Pneumococcal Polysaccharide-23 12/15/2012, 05/20/2016   Zoster Recombinat (Shingrix) 12/21/2017, 12/15/2018   Zoster, Live 03/01/2014   Patient is having some difficulty with transportation. Provided her with the phone number for the social worker in cardiology who helped her arrange transportation for her last visit.   Patient reports she is taking the alendronate regularly despite not filling in 2 years at the pharmacy. She did not start the calcium but wrote it down and will find it to take with her evening medications (not at the same time as  the multivitamin).  Patient was very confused about the patient assistance applications as well. She had called cardiology this week and they told her not to apply for Entresto until later next year because she had to meet an out of pocket minimum. Informed her this was Eliquis, not Entresto, and will mail her the [place] form to fill out again.  Conditions to be addressed/monitored:  Hypertension, Hyperlipidemia, Atrial Fibrillation, Heart Failure, COPD, Asthma, Chronic Kidney Disease, Osteoporosis and Breast cancer, chronic pain, insomnia, and hyperthyroidism  Conditions addressed this encounter: Heart failure, hypertension, osteoporosis  Care Plan : [place]  Updates made by Viona Gilmore, [place] since 01/25/2022 12:00 AM     Problem: Problem: Hypertension, Hyperlipidemia, Atrial Fibrillation, Heart Failure, COPD, Asthma, Chronic Kidney Disease, Osteoporosis and Breast  cancer, chronic pain, insomnia, and hyperthyroidism      Long-Range Goal: Patient-Specific Goal   Start Date: 06/11/2020  Expected End Date: 06/11/2021  Recent Progress: On track  Priority: High  Note:   Current Barriers:  Unable to independently afford treatment regimen Unable to independently monitor therapeutic efficacy Unable to self administer medications as prescribed  Pharmacist Clinical Goal(s):  Patient will verbalize ability to afford treatment regimen achieve adherence to monitoring guidelines and medication adherence to achieve therapeutic efficacy through collaboration with PharmD and provider.   Interventions: 1:1 collaboration with [name], [name], MD regarding development and update of comprehensive plan of care as evidenced by provider attestation and co-signature Inter-disciplinary care team collaboration (see longitudinal plan of care) Comprehensive medication review performed; medication list updated in electronic medical record  Hypertension (BP goal  <140/90) -Controlled -Current treatment: Carvedilol 12.5 mg, 1.5 tablet twice daily - Appropriate, Effective, Safe, Accessible Entresto 49-51 mg 1 tablet twice daily - Appropriate, Effective, Safe, Accessible Spironolactone 25 mg 1/2 tablet daily - Appropriate, Effective, Safe, Accessible -Medications previously tried: none  -Current home readings: 128/90 (checking every other day); nothing > 140 -Current dietary habits: limits salt intake -Current exercise habits: limited in ability to exercise -Denies hypotensive/hypertensive symptoms -Educated on Importance of home blood pressure monitoring; Proper BP monitoring technique; Symptoms of hypotension and importance of maintaining adequate hydration; -Counseled to monitor BP at home weekly, document, and provide log at future appointments -Counseled on diet and exercise extensively Recommended to continue current medication  Hyperlipidemia/history of stroke: (LDL goal < 70) -Controlled -Current treatment: Rosuvastatin 50m, 1 tablet once daily - Appropriate, Effective, Safe, Accessible -Medications previously tried: none  -Current dietary patterns: sometimes fries food, no processed meats, no read meat and uses canolia oil -Current exercise habits: limited in ability -Educated on Cholesterol goals;  Benefits of statin for ASCVD risk reduction; Importance of limiting foods high in cholesterol; Exercise goal of 150 minutes per week; -Counseled on diet and exercise extensively Recommended to continue current medication  Heart Failure (Goal: manage symptoms and prevent exacerbations) -Uncontrolled -Last ejection fraction: 45-50% (Date: 11/21/19) -HF type: Systolic -NYHA Class: III (marked limitation of activity) -AHA HF Stage: C (Heart disease and symptoms present) -Current treatment: Entresto 49-51 mg 1 tablet twice daily - Appropriate, Effective, Safe, Accessible Torsemide 20 mg 1 tablet daily as needed - Appropriate, Effective,  Safe, Accessible Carvedilol 12.5 mg, 1.5 tablets twice daily - Appropriate, Effective, Safe, Accessible Spironolactone 25 mg 1/2 tablet daily - Appropriate, Effective, Query Safe, Accessible -Medications previously tried: losartan , Farxiga (UTIs) -Current home BP/HR readings: never > 140 -Current dietary habits: limits salt intake; drinks only 2 bottles of water and a cup of coffee daily -Current exercise habits: limited in ability; [facility] doing PT [facility] -Educated on Importance of weighing daily; if you gain more than 3 pounds in one day or 5 pounds in one week, call cardiologist Proper diuretic administration and potassium supplementation Importance of blood pressure control -Recommended to continue current medication Assessed patient finances. Patient is receiving Entresto through patient assistance.  Atrial Fibrillation (Goal: prevent stroke and major bleeding) -Controlled -CHADSVASC: 7 -Current treatment: Rate control: carvedilol 12.5 mg 1.5 tablets twice daily - Appropriate, Effective, Safe, Accessible Anticoagulation: Eliquis 5 mg 1 tablet twice daily - Appropriate, Effective, Safe, Accessible -Medications previously tried: warfarin -Home BP and HR readings: nothing > 140  -Counseled on increased risk of stroke due to Afib and  benefits of anticoagulation for stroke prevention; importance of adherence to anticoagulant exactly as prescribed; bleeding risk associated with Eliquis and importance of self-monitoring for signs/symptoms of bleeding; avoidance of NSAIDs due to increased bleeding risk with anticoagulants; -Recommended to continue current medication Educated on importance of taking Eliquis doses approximately 12 hours apart.  COPD/asthma (Goal: control symptoms and prevent exacerbations) -Uncontrolled -Current treatment  Albuterol HFA inhaler, 2 puffs every six hours as needed for whezing or shortness of breath -  Appropriate, Effective, Safe, Accessible Symbicort 160-4.55 mcg/act inhale 2 puffs twice daily - Appropriate, Effective, Safe, Accessible -Medications previously tried: Dentist (switched to Symbicort) -Gold Grade: Gold 1 (FEV1>80%) -Current COPD Classification:  B (high sx, <2 exacerbations/yr) -MMRC/CAT score: n/a -Pulmonary function testing: n/a -Exacerbations requiring treatment in last 6 months: none -Patient denies consistent use of maintenance inhaler (only using once daily) -Frequency of rescue inhaler use: regularly -Counseled on Proper inhaler technique; Benefits of consistent maintenance inhaler use When to use rescue inhaler Differences between maintenance and rescue inhalers -Recommended to continue current medication Assessed patient finances. Qualified for Symbicort patient assistance.   Osteoporosis (Goal prevent fractures) -Uncontrolled -Last DEXA Scan: 12/2020   T-Score femoral neck: -3.3  T-Score total hip: n/a  T-Score lumbar spine: n/a  T-Score forearm radius: -1.9  10-year probability of major osteoporotic fracture: n/a  10-year probability of hip fracture: n/a -Patient is a candidate for pharmacologic treatment due to history of hip fracture  and T-Score < -2.5 in femoral neck -Current treatment  Alendronate 13m, 1 tablet once a week. (Saturday- started 2017) - Appropriate, Query effective, Query Safe, Accessible Vitamin D 1000 units, 1 tablet once daily - Appropriate, Effective, Safe, Accessible Multivitamin 1 tablet daily (calcium 500 mg) - Appropriate, Effective, Safe, Accessible -Medications previously tried: none  -Recommend [phone] units of vitamin D daily. Recommend 1200 mg of calcium daily from dietary and supplemental sources. Counseled on oral bisphosphonate administration: take in the morning, 30 minutes prior to food with 6-8 oz of water. Do not lie down for at least 30 minutes after taking. Recommend weight-bearing and muscle strengthening exercises  for building and maintaining bone density. -Recommended to continue current medication Recommended supplementing with 500-600 mg of calcium citrate daily. Educated on drug holiday and consider Prolia for worsening bone density.  Chronic pain (Goal: minimize symptoms) -Controlled -Current treatment  Diclofenac 1%gel, apply 4g four times daily  - Appropriate, Effective, Safe, Accessible Aspercreme- as needed  - Appropriate, Effective, Safe, Accessible APAP 5081m 2 tablet daily as needed for pain - Appropriate, Effective, Safe, Accessible Tramadol 504m1 tablet three times daily - Appropriate, Effective, Safe, Accessible -Medications previously tried: none  -Recommended to continue current medication  Hyperthyroidism (Goal: TSH 0.35-4.5) -Controlled -Current treatment  Methimazole 5mg21m tablets every evening - Appropriate, Effective, Safe, Accessible -Medications previously tried: none  -Recommended to continue current medication  Breast cancer (Goal: remission) -Controlled -Current treatment  Tamoxifen 20mg39mtablet once daily (started 2019) - Appropriate, Effective, Safe, Accessible -Medications previously tried: none  -Recommended to continue current medication  Insomnia (Goal: improve quality and quantity of sleep) -Not ideally controlled -Current treatment  Ambien 5 mg 1/2-1 tablet at bedtime - Appropriate, Effective, Query Safe, Accessible -Medications previously tried: diphenydramine, trazodone, mirtazapine -Counseled on practicing good sleep hygiene by setting a sleep schedule and maintaining it, avoid excessive napping, following a nightly routine, avoiding screen time for 30-60 minutes before going to bed, and making the bedroom a cool, quiet and dark space  GERD (Goal:  minimize symptoms) -Controlled -Current treatment  Pantoprazole 40 mg 1 tablet daily as needed - Appropriate, Effective, Safe, Accessible -Medications previously tried: none  -Recommended to  continue current medication   Health Maintenance -Vaccine gaps: none -Current therapy:  Senokot-S 8.6-50 mg 2 tablets at bedtime Valacyclovir 500 mg 1 tablet twice daily as needed for outbreaks -Educated on Cost vs benefit of each product must be carefully weighed by individual consumer -Patient is satisfied with current therapy and denies issues -Recommended to continue current medication  Patient Goals/Self-Care Activities Patient will:  - take medications as prescribed check blood pressure weekly, document, and provide at future appointments weigh daily, and contact provider if weight gain of > 3 lbs in one day or > 5 lbs in one week engage in dietary modifications by limiting fried foods  Follow Up Plan: Telephone follow up appointment with care management team member scheduled for: 6 months       Medication Assistance: None required.  Patient affirms current coverage meets needs.  Compliance/Adherence/Medication fill history: Care Gaps: AWV, influenza, COVID booster Last BP - 103/60 on 12/17/2021  Last A1C - 5.2 on 05/04/2021  Star-Rating Drugs: Farxiga 22m - patient assistance [place] Rosuvastatin 40 mg - [facility]  [place]:  [place] - [address] [place] [place] [address]Phone: [phone]: [phone] [facility] ([place] CEnhautLCenter [facility] Change[address]Phone: [phone]: [phone] [place] [address]Phone: [phone]: [address]Phone: [phone]: [address] STigerSM[place]Phone: [phone]: [phone] [facility] MDorene Grebe [address]irvin[address]SLeesvilleTT[place]as[address]Phone: [phone]: [phone] [facility] ([facility]) - [place]- [address] [place] [place] [place]Phone: [phone]: [phone]  Uses pill box? Yes - patient uses a weekly container (AM/PM) Pt endorses 95% compliance  We discussed: Current pharmacy is preferred with insurance plan and patient is satisfied with pharmacy services Patient decided to: Continue current medication management strategy  Care Plan and [name] Decision:  Patient agrees to Care Plan and Follow-up.  Plan: Telephone follow up appointment with care management team member scheduled for:  6 months  MJeni Salles PharmD BSulligentPharmacist LBetweenat BDiscovery [phone]

## 2022-01-25 ENCOUNTER — Ambulatory Visit (INDEPENDENT_AMBULATORY_CARE_PROVIDER_SITE_OTHER): Payer: Medicare PPO | Admitting: Pharmacist

## 2022-01-25 ENCOUNTER — Telehealth (HOSPITAL_COMMUNITY): Payer: Self-pay | Admitting: Licensed Clinical Social Worker

## 2022-01-25 DIAGNOSIS — M810 Age-related osteoporosis without current pathological fracture: Secondary | ICD-10-CM

## 2022-01-25 DIAGNOSIS — I5022 Chronic systolic (congestive) heart failure: Secondary | ICD-10-CM

## 2022-01-25 NOTE — Telephone Encounter (Signed)
H&V Care Navigation CSW Progress Note  Clinical Social Worker received call to help with ride to clinic appt on Thursday.  Pt is enrolled with [employer] so CSW placed order for them to assess for transportation assistance.   SDOH Screenings   Food Insecurity: No Food Insecurity (04/10/2021)  Housing: Low Risk  (04/10/2021)  Transportation Needs: No Transportation Needs (01/07/2022)  Alcohol Screen: Low Risk  (05/26/2020)  Depression (PHQ2-9): Low Risk  (12/17/2021)  Financial Resource Strain: Low Risk  (04/10/2021)  Recent Concern: Financial Resource Strain - Medium Risk (01/19/2021)  Physical Activity: Insufficiently Active (04/10/2021)  Social Connections: Moderately Integrated (04/10/2021)  Stress: No Stress Concern Present (04/10/2021)  Tobacco Use: Medium Risk (01/18/2022)   Jorge Ny, LCSW Clinical Social Worker [facility] Desk#: [phone] Cell#: [phone]

## 2022-01-25 NOTE — Patient Instructions (Signed)
Hi Danielle [name],  It was great to catch up again! Don't forget to start taking calcium citrate 500-600 mg daily like we discussed to help with strengthening your bones.   Please reach out to me if you have any questions or need anything before our follow up!  Best, Maddie  Danielle [name], PharmD, [facility]   Visit Information   Goals Addressed   None    Patient Care Plan: RNCM: Cardiovascular Disease Management( Heart Failure, HTN, paroxysmal atrial fibrillation)  Completed 01/15/2021   Problem Identified: Lack of long term management of Cardiovascular Disease( Heart Failure, HTN, paroxysmal atrial fibrillation) Resolved 01/15/2021  Priority: Medium     Long-Range Goal: Long term self mangement of Cardiovascular Disease Management( Heart Failure, HTN, paroxysmal atrial fibrillation) Completed 01/15/2021  Start Date: 06/24/2020  Expected End Date: 04/29/2021  Recent Progress: On track  Priority: Medium  Note:   Resolving due to duplicate goal  Current Barriers:  Knowledge deficits related to basic heart failure pathophysiology and self care management of Cardiovascular Disease Management( Heart Failure, HTN, paroxysmal atrial fibrillation)  Does not adhere to provider recommendations re: low sodium diet at times  States that the torsemide is working better to help her keep her fluid off and she is taking the [place] as ordered.  Pt reports weighting daily and her weight was 240 today denies any chest pains, reports her swelling in legs has improved.  Reports she wears compression hose but not always, reports shortness of breath with exertion,  denies any recent atrial fibrillation, reports she has her blood checked regularly for her Coumadin, States she is checking her B/P 4-5 times a week and it was 112/74 when she last checked it. States she tries to follow a low sodium diet and the fluid restriction that the heart failure  clinic gave her.  States she had surgery for  vaginal bleeding and Danielle [name] took her off of the tamoxifen.  States she had an MRI of her heart yesterday   Nurse Case Manager Clinical Goal(s):  patient will weigh self daily and record patient will verbalize understanding of Heart Failure Action Plan and when to call doctor patient will take all Heart Failure mediations as prescribed Interventions:  Collaboration with Danielle, [name], MD regarding development and update of comprehensive plan of care as evidenced by provider attestation and co-signature Inter-disciplinary care team collaboration (see longitudinal plan of care) Reveiwed basic overview and discussion of pathophysiology of Heart Failure Reinforced to follow a  low sodium diet and fluid restrictions Reinforced Heart Failure Action Plan  Reinforced importance of daily weight Reinforced role of diuretics in prevention of fluid overload Reviewed s/sx of bleeding to call provider and reviewed foods to avoid while on Coumadin Reviewed upcoming provider appointments Danielle [name] 12/08/20, Heart failure clinic 12/17/20, Dr. Saunders Revel cardiology 05/20/21, CCM PharmD 01/02/21 Self-Care Activities:  Takes Heart Failure Medications as prescribed Weighs daily and record (notifying MD of 3 lb weight gain over night or 5 lb in a week)  Patient Goals:  - Take Heart Failure Medications as prescribed - Weigh daily and record (notify MD with 3 lb weight gain over night or 5 lb in a week) - Follow CHF Action Plan - Adhere to low sodium diet - begin a heart failure diary - bring diary to all appointments - develop a rescue plan - eat more whole grains, fruits and vegetables, lean meats and healthy fats - follow rescue plan if symptoms flare-up -  know when to call the doctor - follow activity or exercise plan - make an activity or exercise plan - meet with physical therapist - pace activity allowing for rest - do ankle pumps when sitting - keep legs up  while sitting - track weight in diary - use salt in moderation - watch for swelling in feet, ankles and legs every day -Calls provider office for new concerns, questions, or BP outside discussed parameters -Follows a low sodium diet/DASH diet -check blood pressure 3 times per week - write blood pressure and weight results in a log or diary  -Plan to fill your medication box weekly  Follow Up Plan: Telephone follow up appointment with care management team member scheduled for:  01/15/21 at 3:30 PM The patient has been provided with contact information for the care management team and has been advised to call with any health related questions or concerns.      Patient Care Plan: LCSW Plan of Care     Problem Identified: Reduce and Manage Symptoms of Depression, Grief and Loss. Resolved 07/07/2020  Priority: High     Goal: Reduce and Manage Symptoms of Depression, Grief and Loss. Completed 07/07/2020  Start Date: 05/26/2020  Expected End Date: 07/07/2020  This Visit's Progress: On track  Recent Progress: On track  Priority: High  Note:   Current Barriers:  Unable to independently manage symptoms of Depression and Grief. Knowledge deficits related to accessing mental health providers in patient with Depression and Grief. Patient is experiencing symptoms of Depression and Grief, which seem to be exacerbated by the recent death of her sister.     Patient needs Support, Education, and Care Coordination in order to meet unmet mental health needs. Patient is unable to independently navigate community resource options without care coordination support. Deceased sisters memorial service and birthday celebration are scheduled for Friday, May 6th, which may trigger depression and grief. Clinical Social Work Goal(s):  Over the next 60 days, patient will work with [employer] weekly, by telephone, to reduce and manage symptoms of Depression and Grief, until connected for ongoing counseling resources within the  community.  Patient will implement clinical interventions discussed today to decrease symptoms of Depression and Grief, and increase knowledge and/or ability of: coping skills, healthy habits, self-management skills, and stress reduction. Over the next 60 days, patient will demonstrate improved adherence to prescribed treatment plan for Depression and Grief, as evidenced by a reduction in symptoms.  Interventions:  Collaboration with Danielle, [name], MD regarding development and update of comprehensive plan of care as evidenced by provider attestation and co-signature. Assessed patient's understanding, education, previous treatment and care coordination needs.  Patient interviewed and appropriate assessments performed: Review of PHQ 2 and PHQ 9 Depression Screening scores. Provided basic mental health support, education and interventions.  Collaborated with appropriate clinical care team members regarding patient needs, requesting Nutrition/Dietary Consult from Danielle, [name], MD. Discussed several options for long term counseling based on need and insurance.  Assisted patient with narrowing down options for long-term counseling services and provided list of community providers in network with [employer].   Reviewed mental health medications with patient prescribed by PCP and discussed compliance.  Other interventions include: Motivational Interviewing, Solution-Focused Strategies, Mindfulness Meditation Strategies, Relaxation Techniques, and Deep Breathing Exercises, Brief Cognitive Behavioral Therapy, Grief Counseling, Emotional/Supportive Counseling, Psychotropic Medication Adherence Assessment, Sleep Hygiene Assessment, Psychoeducation, Health Education, Problem Solving and Teaching/Coaching Strategies.  Mailed patient mental health counseling resources, list of community providers, coping strategies/techniques  and list of support groups. Self-Care Activities: Self administers  medications as prescribed; Attends all scheduled provider appointments; Calls pharmacy for medication refills; Attends church and other social activities; Lives independently with husband and performs ADL's/IADL's independently; Calls provider office for new questions and/or concerns; Motivated for treatment; Strong family and social support. Patient Goals: Continue to implement clinical interventions to help reduce and manage symptoms (I.e. Mindfulness Meditation Strategies, Relaxation Techniques, and Deep Breathing Exercises) on a daily basis. Try to exercise at least 2 to 3 times per week, or as tolerated, eat a healthy, well-balanced diet, drink plenty of fluids and obtain adequate rest. Continue spending time talking or visiting with friends, family members, neighbors, congregation members, etc., to gain support and socialization.   Follow Up Plan: No Follow-Up Required.    Patient Care Plan: RNCM:Chronic Pain (Adult)  Completed 01/15/2021   Problem Identified: Chronic Pain Management (Chronic Pain) Resolved 01/15/2021  Priority: Medium     Long-Range Goal: Chronic Pain Managed Completed 01/15/2021  Start Date: 06/24/2020  Expected End Date: 04/29/2021  Recent Progress: On track  Priority: Medium  Note:   Resolving due to duplicate goal  Current Barriers:  Knowledge Deficits related to self-health management of acute or chronic pain of lower back due to degenerative disc disease  Chronic Disease Management support and education needs related to chronic pain Chronic Disease Management support and education needs related to chronic back pain Does not adhere to provider recommendations re: exercise Unable to perform IADLs independently uses walker  Pt states she is to have injections in her knees to help with the pain.  States she is to go Monday to be evaluated for water PT for her knees.  States she would like to get strong enough to only need a cane and be able to help her husband  around the house more. states she is taking her Tramadol as ordered.  Denies any falls.  States she uses her walker in her home but uses a wheelchair when she goes to appointments Clinical Goal(s):  patient will verbalize understanding of plan for pain management. , patient will attend all scheduled medical appointments:  Dr. Read Drivers 01/27/21, PT at [facility] 12/08/20, patient will demonstrate use of different relaxation  skills and/or diversional activities to assist with pain reduction (distraction, imagery, relaxation, massage, acupressure, TENS, heat, and cold application., patient will report pain at a level less than 3 to 4 on a 10-10 rating scale., patient will use pharmacological and nonpharmacological pain relief strategies as prescribed. , patient will verbalize acceptable level of pain relief and ability to engage in desired activities, and patient will engage in desired activities without an increase in pain level Interventions:  Collaboration with Danielle, [name], MD regarding development and update of comprehensive plan of care as evidenced by provider attestation and co-signature Pain assessment performed Medications reviewed Reinforced on methods of chronic pain control  Discussed plans with patient for ongoing care management follow up and provided patient with direct contact information for care management team Reviewed scheduled/upcoming provider appointments including:  Dr. Read Drivers 01/27/21, PT at [facility] 12/08/20 Reinforced benefits of exercise and therapy to help with chronic pain and mobility  Patient Goals/Self Care Activities:  Will self-administer medications as prescribed Will attend all scheduled provider appointments Will call pharmacy for medication refills 7 days prior to needed refill date Patient will calls provider office for new concerns or questions  learn how to meditate learn relaxation techniques practice acceptance of chronic pain  practice relaxation  or meditation daily  tell myself I can (not I can't) think of new ways to do favorite things  use distraction techniques  use relaxation during pain Follow Up Plan: Telephone follow up appointment with care management team member scheduled for: 01/15/21 at 3:30 PM The patient has been provided with contact information for the care management team and has been advised to call with any health related questions or concerns.       Patient Care Plan: CCM Pharmacy Care Plan     Problem Identified: Problem: Hypertension, Hyperlipidemia, Atrial Fibrillation, Heart Failure, COPD, Asthma, Chronic Kidney Disease, Osteoporosis and Breast cancer, chronic pain, insomnia, and hyperthyroidism      Long-Range Goal: Patient-Specific Goal   Start Date: 06/11/2020  Expected End Date: 06/11/2021  Recent Progress: On track  Priority: High  Note:   Current Barriers:  Unable to independently afford treatment regimen Unable to independently monitor therapeutic efficacy Unable to self administer medications as prescribed  Pharmacist Clinical Goal(s):  Patient will verbalize ability to afford treatment regimen achieve adherence to monitoring guidelines and medication adherence to achieve therapeutic efficacy through collaboration with PharmD and provider.   Interventions: 1:1 collaboration with Danielle, [name], MD regarding development and update of comprehensive plan of care as evidenced by provider attestation and co-signature Inter-disciplinary care team collaboration (see longitudinal plan of care) Comprehensive medication review performed; medication list updated in electronic medical record  Hypertension (BP goal <140/90) -Controlled -Current treatment: Carvedilol 12.5 mg, 1.5 tablet twice daily - Appropriate, Effective, Safe, Accessible Entresto 49-51 mg 1 tablet twice daily - Appropriate, Effective, Safe, Accessible Spironolactone 25 mg 1/2 tablet daily - Appropriate, Effective, Safe,  Accessible -Medications previously tried: none  -Current home readings: 128/90 (checking every other day); nothing > 140 -Current dietary habits: limits salt intake -Current exercise habits: limited in ability to exercise -Denies hypotensive/hypertensive symptoms -Educated on Importance of home blood pressure monitoring; Proper BP monitoring technique; Symptoms of hypotension and importance of maintaining adequate hydration; -Counseled to monitor BP at home weekly, document, and provide log at future appointments -Counseled on diet and exercise extensively Recommended to continue current medication  Hyperlipidemia/history of stroke: (LDL goal < 70) -Controlled -Current treatment: Rosuvastatin 34m, 1 tablet once daily - Appropriate, Effective, Safe, Accessible -Medications previously tried: none  -Current dietary patterns: sometimes fries food, no processed meats, no read meat and uses canolia oil -Current exercise habits: limited in ability -Educated on Cholesterol goals;  Benefits of statin for ASCVD risk reduction; Importance of limiting foods high in cholesterol; Exercise goal of 150 minutes per week; -Counseled on diet and exercise extensively Recommended to continue current medication  Heart Failure (Goal: manage symptoms and prevent exacerbations) -Uncontrolled -Last ejection fraction: 45-50% (Date: 11/21/19) -HF type: Systolic -NYHA Class: III (marked limitation of activity) -AHA HF Stage: C (Heart disease and symptoms present) -Current treatment: Entresto 49-51 mg 1 tablet twice daily - Appropriate, Effective, Safe, Accessible Torsemide 20 mg 1 tablet daily as needed - Appropriate, Effective, Safe, Accessible Carvedilol 12.5 mg, 1.5 tablets twice daily - Appropriate, Effective, Safe, Accessible Spironolactone 25 mg 1/2 tablet daily - Appropriate, Effective, Query Safe, Accessible -Medications previously tried: losartan , Farxiga (UTIs) -Current home BP/HR readings:  never > 140 -Current dietary habits: limits salt intake; drinks only 2 bottles of water and a cup of coffee daily -Current exercise habits: limited in ability; [facility] -Educated on Importance of weighing daily; if you  gain more than 3 pounds in one day or 5 pounds in one week, call cardiologist Proper diuretic administration and potassium supplementation Importance of blood pressure control -Recommended to continue current medication Assessed patient finances. Patient is receiving Entresto through patient assistance.  Atrial Fibrillation (Goal: prevent stroke and major bleeding) -Controlled -CHADSVASC: 7 -Current treatment: Rate control: carvedilol 12.5 mg 1.5 tablets twice daily - Appropriate, Effective, Safe, Accessible Anticoagulation: Eliquis 5 mg 1 tablet twice daily - Appropriate, Effective, Safe, Accessible -Medications previously tried: warfarin -Home BP and HR readings: nothing > 140  -Counseled on increased risk of stroke due to Afib and benefits of anticoagulation for stroke prevention; importance of adherence to anticoagulant exactly as prescribed; bleeding risk associated with Eliquis and importance of self-monitoring for signs/symptoms of bleeding; avoidance of NSAIDs due to increased bleeding risk with anticoagulants; -Recommended to continue current medication Educated on importance of taking Eliquis doses approximately 12 hours apart.  COPD/asthma (Goal: control symptoms and prevent exacerbations) -Uncontrolled -Current treatment  Albuterol HFA inhaler, 2 puffs every six hours as needed for whezing or shortness of breath - Appropriate, Effective, Safe, Accessible Symbicort 160-4.55 mcg/act inhale 2 puffs twice daily - Appropriate, Effective, Safe, Accessible -Medications previously tried: Dentist (switched to Symbicort) -Gold Grade: Gold 1 (FEV1>80%) -Current COPD Classification:  B (high sx, <2  exacerbations/yr) -MMRC/CAT score: n/a -Pulmonary function testing: n/a -Exacerbations requiring treatment in last 6 months: none -Patient denies consistent use of maintenance inhaler (only using once daily) -Frequency of rescue inhaler use: regularly -Counseled on Proper inhaler technique; Benefits of consistent maintenance inhaler use When to use rescue inhaler Differences between maintenance and rescue inhalers -Recommended to continue current medication Assessed patient finances. Qualified for Symbicort patient assistance.   Osteoporosis (Goal prevent fractures) -Uncontrolled -Last DEXA Scan: 12/2020   T-Score femoral neck: -3.3  T-Score total hip: n/a  T-Score lumbar spine: n/a  T-Score forearm radius: -1.9  10-year probability of major osteoporotic fracture: n/a  10-year probability of hip fracture: n/a -Patient is a candidate for pharmacologic treatment due to history of hip fracture  and T-Score < -2.5 in femoral neck -Current treatment  Alendronate 82m, 1 tablet once a week. (Saturday- started 2017) - Appropriate, Query effective, Query Safe, Accessible Vitamin D 1000 units, 1 tablet once daily - Appropriate, Effective, Safe, Accessible Multivitamin 1 tablet daily (calcium 500 mg) - Appropriate, Effective, Safe, Accessible -Medications previously tried: none  -Recommend [phone] units of vitamin D daily. Recommend 1200 mg of calcium daily from dietary and supplemental sources. Counseled on oral bisphosphonate administration: take in the morning, 30 minutes prior to food with 6-8 oz of water. Do not lie down for at least 30 minutes after taking. Recommend weight-bearing and muscle strengthening exercises for building and maintaining bone density. -Recommended to continue current medication Recommended supplementing with 500-600 mg of calcium citrate daily. Educated on drug holiday and consider Prolia for worsening bone density.  Chronic pain (Goal: minimize  symptoms) -Controlled -Current treatment  Diclofenac 1%gel, apply 4g four times daily  - Appropriate, Effective, Safe, Accessible Aspercreme- as needed  - Appropriate, Effective, Safe, Accessible APAP 5025m 2 tablet daily as needed for pain - Appropriate, Effective, Safe, Accessible Tramadol 5058m1 tablet three times daily - Appropriate, Effective, Safe, Accessible -Medications previously tried: none  -Recommended to continue current medication  Hyperthyroidism (Goal: TSH 0.35-4.5) -Controlled -Current treatment  Methimazole 5mg76m tablets every evening - Appropriate, Effective, Safe, Accessible -Medications previously tried: none  -Recommended to continue current medication  Breast cancer (Goal:  remission) -Controlled -Current treatment  Tamoxifen 48m, 1 tablet once daily (started 2019) - Appropriate, Effective, Safe, Accessible -Medications previously tried: none  -Recommended to continue current medication  Insomnia (Goal: improve quality and quantity of sleep) -Not ideally controlled -Current treatment  Ambien 5 mg 1/2-1 tablet at bedtime - Appropriate, Effective, Query Safe, Accessible -Medications previously tried: diphenydramine, trazodone, mirtazapine -Counseled on practicing good sleep hygiene by setting a sleep schedule and maintaining it, avoid excessive napping, following a nightly routine, avoiding screen time for 30-60 minutes before going to bed, and making the bedroom a cool, quiet and dark space  GERD (Goal: minimize symptoms) -Controlled -Current treatment  Pantoprazole 40 mg 1 tablet daily as needed - Appropriate, Effective, Safe, Accessible -Medications previously tried: none  -Recommended to continue current medication   Health Maintenance -Vaccine gaps: none -Current therapy:  Senokot-S 8.6-50 mg 2 tablets at bedtime Valacyclovir 500 mg 1 tablet twice daily as needed for outbreaks -Educated on Cost vs benefit of each product must be carefully  weighed by individual consumer -Patient is satisfied with current therapy and denies issues -Recommended to continue current medication  Patient Goals/Self-Care Activities Patient will:  - take medications as prescribed check blood pressure weekly, document, and provide at future appointments weigh daily, and contact provider if weight gain of > 3 lbs in one day or > 5 lbs in one week engage in dietary modifications by limiting fried foods  Follow Up Plan: Telephone follow up appointment with care management team member scheduled for: 6 months      Patient Care Plan: RN Care Manager Plan of Care  Completed 09/10/2021   Problem Identified: Chronic Disease Management and Care Coordination Needs (CHF, HTN, Atrial Fib,HLD, chronic pain) Resolved 09/10/2021  Priority: High     Long-Range Goal: Establish Plan of Care for Chronic Disease Management Needs (CHF, HTN, Atrial Fib, HLD,chronic pain) Completed 09/10/2021  Start Date: 01/15/2021  Expected End Date: 05/22/2022  Recent Progress: On track  Priority: High  Note:   Case closed goals met Current Barriers:  Chronic Disease Management support and education needs related to Atrial Fibrillation, CHF, HTN, HLD, Osteoarthritis, and chronic pain States her weigh has been stable  and she weighed  242 today. Denies any chest pains.  Reports no swelling and she wears compression hose and keeps her legs elevated.  Denies any increase in shortness of breath with exertion,  Denies any recent atrial fibrillation. States she trying to eat more vegetables since she was changed to Eliquis. .  States she is checking her B/P 4-5 times a week and it has been good Denies any dizziness States she tries to follow a low sodium diet and the fluid restriction that the heart failure clinic gave her.   States she is using her Cubbi to exercise her legs.  States she is doing around 1000-1500 steps/day every other day. States she has not been walking but she has been  doing leg lifts and squats.  States her husband has been dx with Parkinson's and he can not drive anymore.  States she has filled out the  [employer] form and she got the resources from the care guide  RNCM Clinical Goal(s):  Patient will verbalize understanding of plan for management of Atrial Fibrillation, CHF, HTN, HLD, Osteoarthritis, and chronic pain as evidenced by voiced adherence to plan of care verbalize basic understanding of  Atrial Fibrillation, CHF, HTN, HLD, Osteoarthritis, and chronic pain disease process and self health management plan as evidenced by voiced  understanding and teach back take all medications exactly as prescribed and will call provider for medication related questions as evidenced by dispense report and pt verbalization attend all scheduled medical appointments: GYN 09/17/21 HF clinic 10/13/21, Pain clinic 10/27/21, Dr. Saunders Revel 11/25/21, Annual Wellness Visit 02/01/22 as evidenced by medical records demonstrate Improved adherence to prescribed treatment plan for Atrial Fibrillation, CHF, HTN, HLD, Osteoarthritis, and chronic pain as evidenced by reading within limits, voiced adherence to plan of care continue to work with RN Care Manager to address care management and care coordination needs related to  Atrial Fibrillation, CHF, HTN, HLD, Osteoarthritis, and chronic pain as evidenced by adherence to CM Team Scheduled appointments through collaboration with RN Care manager, provider, and care team.   Interventions: 1:1 collaboration with primary care provider regarding development and update of comprehensive plan of care as evidenced by provider attestation and co-signature Inter-disciplinary care team collaboration (see longitudinal plan of care) Evaluation of current treatment plan related to  self management and patient's adherence to plan as established by provider  AFIB Interventions: (Status:  Goal Met.) Long Term Goal   Counseled on increased risk of stroke due to Afib and  benefits of anticoagulation for stroke prevention Reviewed importance of adherence to anticoagulant exactly as prescribed Counseled on bleeding risk associated with Eliquis and importance of self-monitoring for signs/symptoms of bleeding Counseled on seeking medical attention after a head injury or if there is blood in the urine/stool Afib action plan reviewed Reinforced  s/sx of atrial fib and when to call provider   Heart Failure Interventions:  (Status:  Goal Met.) Long Term Goal Basic overview and discussion of pathophysiology of Heart Failure reviewed Provided education on low sodium diet Discussed importance of daily weight and advised patient to weigh and record daily Reviewed role of diuretics in prevention of fluid overload and management of heart failure; Discussed the importance of keeping all appointments with provider Referral made to community resources care guide team for assistance with transportation; Reinforced s/sx of HF to call provider.   Reinforced to follow low sodium heart healthy diet  Hyperlipidemia Interventions:  (Status:  Goal Met.) Long Term Goal Medication review performed; medication list updated in electronic medical record.  Counseled on importance of regular laboratory monitoring as prescribed Reviewed role and benefits of statin for ASCVD risk reduction Reviewed importance of limiting foods high in cholesterol Reviewed exercise goals and target of 150 minutes per week  Hypertension Interventions:  (Status:  Goal Met.) Long Term Goal Last practice recorded BP readings:  BP Readings from Last 3 Encounters:  07/28/21 113/77  07/14/21 124/80  05/20/21 102/70  Most recent eGFR/CrCl:  Lab Results  Component Value Date   EGFR 55 (L) 12/22/2016    No components found for: CRCL  Evaluation of current treatment plan related to hypertension self management and patient's adherence to plan as established by provider Provided education to patient re: stroke  prevention, s/s of heart attack and stroke Reviewed medications with patient and discussed importance of compliance Discussed plans with patient for ongoing care management follow up and provided patient with direct contact information for care management team Advised patient, providing education and rationale, to monitor blood pressure daily and record, calling PCP for findings outside established parameters Provided education on prescribed diet low sodium heart healthy Reinforced to continue to work on weight loss. Reinforced to continue to get exercise by using her [employer]  Pain Interventions:  (Status:  Goal Met.) Long Term Goal Pain assessment performed Medications  reviewed Reviewed provider established plan for pain management Discussed importance of adherence to all scheduled medical appointments Counseled on the importance of reporting any/all new or changed pain symptoms or management strategies to pain management provider Advised patient to report to care team affect of pain on daily activities Discussed use of relaxation techniques and/or diversional activities to assist with pain reduction (distraction, imagery, relaxation, massage, acupressure, TENS, heat, and cold application Reinforced to continue to do leg exercises. Reinforced to call provider if knee pain worsens      Patient Goals/Self-Care Activities: Take all medications as prescribed Attend all scheduled provider appointments Call pharmacy for medication refills 3-7 days in advance of running out of medications Call provider office for new concerns or questions  call office if I gain more than 2 pounds in one day or 5 pounds in one week keep legs up while sitting track weight in diary use salt in moderation watch for swelling in feet, ankles and legs every day follow rescue plan if symptoms flare-up check blood pressure daily choose a place to take my blood pressure (home, clinic or office, retail store) take blood  pressure log to all doctor appointments take medications for blood pressure exactly as prescribed call for medicine refill 2 or 3 days before it runs out take all medications exactly as prescribed call doctor with any symptoms you believe are related to your medicine - learn how to meditate - learn relaxation techniques - practice acceptance of chronic pain - practice relaxation or meditation daily - tell myself I can (not I can't) - think of new ways to do favorite things - use distraction techniques - use relaxation during pain Follow Up Plan:  The patient has been provided with contact information for the care management team and has been advised to call with any health related questions or concerns.  No further follow up required: Case closed goals met         Patient verbalizes understanding of instructions and care plan provided today and agrees to view in [place]. Active MyChart status and patient understanding of how to access instructions and care plan via MyChart confirmed with patient.    Telephone follow up appointment with pharmacy team member scheduled for:  6 months  Viona Gilmore, [facility]

## 2022-01-26 ENCOUNTER — Telehealth: Payer: Self-pay | Admitting: *Deleted

## 2022-01-26 NOTE — Telephone Encounter (Signed)
   Telephone encounter was:  Successful.  01/26/2022 Name: Danielle [name] MRN: [id number] DOB: [birth year]  Danielle [name] is a 76 y.o. year old female who is a primary care patient of [name], [name], MD . The community resource team was consulted for assistance with Transportation Needs  Booked transportation with Danielle [name] for Lab appt  Care guide performed the following interventions: Patient provided with information about care guide support team and interviewed to confirm resource needs.  Follow Up Plan:  No further follow up planned at this time. The patient has been provided with needed resources.  [place] [phone] [address] , [place] [address] Email : [name]

## 2022-01-27 ENCOUNTER — Telehealth: Payer: Self-pay | Admitting: Pharmacist

## 2022-01-27 NOTE — Chronic Care Management (AMB) (Signed)
    Chronic Care Management Pharmacy Assistant   Name: Danielle [name]  MRN: [id number] DOB: 27-Jan-1946  Reason for Encounter: Patient Assistance Application for [employer]  PAP completed for [employer], to be mailed to patient today to be completed and returned to [place], [place] Pharmacist Assistant [phone]

## 2022-01-28 ENCOUNTER — Encounter: Payer: Medicare PPO | Admitting: Physical Medicine & Rehabilitation

## 2022-01-28 ENCOUNTER — Ambulatory Visit (HOSPITAL_COMMUNITY)
Admission: RE | Admit: 2022-01-28 | Discharge: 2022-01-28 | Disposition: A | Discharge status: Home / Self Care | Payer: Medicare PPO | Source: Ambulatory Visit | Attending: Cardiology | Admitting: Cardiology

## 2022-01-28 DIAGNOSIS — M810 Age-related osteoporosis without current pathological fracture: Secondary | ICD-10-CM

## 2022-01-28 DIAGNOSIS — I5022 Chronic systolic (congestive) heart failure: Secondary | ICD-10-CM

## 2022-01-28 LAB — BASIC METABOLIC PANEL
Anion gap: 6 (ref 5–15)
BUN: 22 mg/dL (ref 8–23)
CO2: 21 mmol/L — ABNORMAL LOW (ref 22–32)
Calcium: 9.9 mg/dL (ref 8.9–10.3)
Chloride: 110 mmol/L (ref 98–111)
Creatinine, Ser: 1.57 mg/dL — ABNORMAL HIGH (ref 0.44–1.00)
GFR, Estimated: 34 mL/min — ABNORMAL LOW (ref >60)
Glucose, Bld: 95 mg/dL (ref 70–99)
Potassium: 5.3 mmol/L — ABNORMAL HIGH (ref 3.5–5.1)
Sodium: 137 mmol/L (ref 135–145)

## 2022-01-29 ENCOUNTER — Other Ambulatory Visit (HOSPITAL_COMMUNITY): Payer: Self-pay

## 2022-01-29 ENCOUNTER — Telehealth (HOSPITAL_COMMUNITY): Payer: Self-pay

## 2022-01-29 DIAGNOSIS — I5022 Chronic systolic (congestive) heart failure: Secondary | ICD-10-CM

## 2022-01-29 NOTE — Telephone Encounter (Signed)
Patient aware and agreeable. Labs ordered and scheduled. 

## 2022-02-01 ENCOUNTER — Ambulatory Visit: Payer: Medicare Other

## 2022-02-01 ENCOUNTER — Ambulatory Visit (INDEPENDENT_AMBULATORY_CARE_PROVIDER_SITE_OTHER): Payer: Medicare Other

## 2022-02-01 VITALS — Ht 67.0 in | Wt 220.0 lb

## 2022-02-01 DIAGNOSIS — Z0000 Encounter for general adult medical examination without abnormal findings: Secondary | ICD-10-CM

## 2022-02-01 NOTE — Patient Instructions (Addendum)
Danielle [name] , Thank you for taking time to come for your Medicare Wellness Visit. I appreciate your ongoing commitment to your health goals. Please review the following plan we discussed and let me know if I can assist you in the future.   These are the goals we discussed:  Goals       patient      Will be independent again       Patient Stated      To walk without assistance and use a cane      Patient Stated      Lose weight       Patient Stated      01/19/2021, start walking on her own      Patient stated (pt-stated)      I want to be able to walk on my own without medical devices.        This is a list of the screening recommended for you and due dates:  Health Maintenance  Topic Date Due   DTaP/Tdap/Td vaccine (1 - Tdap) Never done   COVID-19 Vaccine (4 - 2023-24 season) 02/17/2022*   Flu Shot  05/30/2022*   Medicare Annual Wellness Visit  02/02/2023   Pneumonia Vaccine  Completed   DEXA scan (bone density measurement)  Completed   Hepatitis C Screening: USPSTF Recommendation to screen - Ages 65-79 yo.  Completed   Zoster (Shingles) Vaccine  Completed   HPV Vaccine  Aged Out  *Topic was postponed. The date shown is not the original due date.  Opioid Pain Medicine Management Opioids are powerful medicines that are used to treat moderate to severe pain. When used for short periods of time, they can help you to: Sleep better. Do better in physical or occupational therapy. Feel better in the first few days after an injury. Recover from surgery. Opioids should be taken with the supervision of a trained health care provider. They should be taken for the shortest period of time possible. This is because opioids can be addictive, and the longer you take opioids, the greater your risk of addiction. This addiction can also be called opioid use disorder. What are the risks? Using opioid pain medicines for longer than 3 days increases your risk of side effects. Side  effects include: Constipation. Nausea and vomiting. Breathing difficulties (respiratory depression). Drowsiness. Confusion. Opioid use disorder. Itching. Taking opioid pain medicine for a long period of time can affect your ability to do daily tasks. It also puts you at risk for: Motor vehicle crashes. Depression. Suicide. Heart attack. Overdose, which can be life-threatening. What is a pain treatment plan? A pain treatment plan is an agreement between you and your health care provider. Pain is unique to each person, and treatments vary depending on your condition. To manage your pain, you and your health care provider need to work together. To help you do this: Discuss the goals of your treatment, including how much pain you might expect to have and how you will manage the pain. Review the risks and benefits of taking opioid medicines. Remember that a good treatment plan uses more than one approach and minimizes the chance of side effects. Be honest about the amount of medicines you take and about any drug or alcohol use. Get pain medicine prescriptions from only one health care provider. Pain can be managed with many types of alternative treatments. Ask your health care provider to refer you to one or more specialists who can help you manage pain through:  Physical or occupational therapy. Counseling (cognitive behavioral therapy). Good nutrition. Biofeedback. Massage. Meditation. Non-opioid medicine. Following a gentle exercise program. How to use opioid pain medicine Taking medicine Take your pain medicine exactly as told by your health care provider. Take it only when you need it. If your pain gets less severe, you may take less than your prescribed dose if your health care provider approves. If you are not having pain, do nottake pain medicine unless your health care provider tells you to take it. If your pain is severe, do nottry to treat it yourself by taking more pills than  instructed on your prescription. Contact your health care provider for help. Write down the times when you take your pain medicine. It is easy to become confused while on pain medicine. Writing the time can help you avoid overdose. Take other over-the-counter or prescription medicines only as told by your health care provider. Keeping yourself and others safe  While you are taking opioid pain medicine: Do not drive, use machinery, or power tools. Do not sign legal documents. Do not drink alcohol. Do not take sleeping pills. Do not supervise children by yourself. Do not do activities that require climbing or being in high places. Do not go to a lake, river, ocean, spa, or swimming pool. Do not share your pain medicine with anyone. Keep pain medicine in a locked cabinet or in a secure area where pets and children cannot reach it. Stopping your use of opioids If you have been taking opioid medicine for more than a few weeks, you may need to slowly decrease (taper) how much you take until you stop completely. Tapering your use of opioids can decrease your risk of symptoms of withdrawal, such as: Pain and cramping in the abdomen. Nausea. Sweating. Sleepiness. Restlessness. Uncontrollable shaking (tremors). Cravings for the medicine. Do not attempt to taper your use of opioids on your own. Talk with your health care provider about how to do this. Your health care provider may prescribe a step-down schedule based on how much medicine you are taking and how long you have been taking it. Getting rid of leftover pills Do not save any leftover pills. Get rid of leftover pills safely by: Taking the medicine to a prescription take-back program. This is usually offered by the county or law enforcement. Bringing them to a pharmacy that has a drug disposal container. Flushing them down the toilet. Check the label or package insert of your medicine to see whether this is safe to do. Throwing them out in  the trash. Check the label or package insert of your medicine to see whether this is safe to do. If it is safe to throw it out, remove the medicine from the original container, put it into a sealable bag or container, and mix it with used coffee grounds, food scraps, dirt, or cat litter before putting it in the trash. Follow these instructions at home: Activity Do exercises as told by your health care provider. Avoid activities that make your pain worse. Return to your normal activities as told by your health care provider. Ask your health care provider what activities are safe for you. General instructions You may need to take these actions to prevent or treat constipation: Drink enough fluid to keep your urine pale yellow. Take over-the-counter or prescription medicines. Eat foods that are high in fiber, such as beans, whole grains, and fresh fruits and vegetables. Limit foods that are high in fat and processed sugars, such as fried  or sweet foods. Keep all follow-up visits. This is important. Where to find support If you have been taking opioids for a long time, you may benefit from receiving support for quitting from a local support group or counselor. Ask your health care provider for a referral to these resources in your area. Where to find more information Centers for Disease Control and Prevention (CDC): http://www.wolf.info/ [place] Food and Drug Administration (FDA): [employer] Get help right away if: You may have taken too much of an opioid (overdosed). Common symptoms of an overdose: Your breathing is slower or more shallow than normal. You have a very slow heartbeat (pulse). You have slurred speech. You have nausea and vomiting. Your pupils become very small. You have other potential symptoms: You are very confused. You faint or feel like you will faint. You have cold, clammy skin. You have blue lips or fingernails. You have thoughts of harming yourself or harming others. These  symptoms may represent a serious problem that is an emergency. Do not wait to see if the symptoms will go away. Get medical help right away. Call your local emergency services (911 in the [place] Do not drive yourself to the hospital.  If you ever feel like you may hurt yourself or others, or have thoughts about taking your own life, get help right away. Go to your nearest emergency department or: Call your local emergency services (911 in the [place] Call the [facility] 440-025-4938 in the [place] Call a suicide crisis helpline, such as the [place] at 508-327-1018 or 988 in the [place]. This is open 24 hours a day in the [place] Text the Crisis Text Line at 917-754-6341 (in the [place].). Summary Opioid medicines can help you manage moderate to severe pain for a short period of time. A pain treatment plan is an agreement between you and your health care provider. Discuss the goals of your treatment, including how much pain you might expect to have and how you will manage the pain. If you think that you or someone else may have taken too much of an opioid, get medical help right away. This information is not intended to replace advice given to you by your health care provider. Make sure you discuss any questions you have with your health care provider. Document Revised: 09/10/2020 Document Reviewed: 05/28/2020 Elsevier Patient Education  [place] directives: Advance directive discussed with you today. Even though you declined this today, please call our office should you change your mind, and we can give you the proper paperwork for you to fill out.   Conditions/risks identified: None  Next appointment: Follow up in one year for your annual wellness visit    Preventive Care 65 Years and Older, Female Preventive care refers to lifestyle choices and visits with your health care provider that can promote health and wellness. What does  preventive care include? A yearly physical exam. This is also called an annual well check. Dental exams once or twice a year. Routine eye exams. Ask your health care provider how often you should have your eyes checked. Personal lifestyle choices, including: Daily care of your teeth and gums. Regular physical activity. Eating a healthy diet. Avoiding tobacco and drug use. Limiting alcohol use. Practicing safe sex. Taking low-dose aspirin every day. Taking vitamin and mineral supplements as recommended by your health care provider. What happens during an annual well check? The services and screenings done by your health care provider during your  annual well check will depend on your age, overall health, lifestyle risk factors, and family history of disease. Counseling  Your health care provider may ask you questions about your: Alcohol use. Tobacco use. Drug use. Emotional well-being. Home and relationship well-being. Sexual activity. Eating habits. History of falls. Memory and ability to understand (cognition). Work and work Statistician. Reproductive health. Screening  You may have the following tests or measurements: Height, weight, and BMI. Blood pressure. Lipid and cholesterol levels. These may be checked every 5 years, or more frequently if you are over 72 years old. Skin check. Lung cancer screening. You may have this screening every year starting at age 57 if you have a 30-pack-year history of smoking and currently smoke or have quit within the past 15 years. Fecal occult blood test (FOBT) of the stool. You may have this test every year starting at age 26. Flexible sigmoidoscopy or colonoscopy. You may have a sigmoidoscopy every 5 years or a colonoscopy every 10 years starting at age 51. Hepatitis C blood test. Hepatitis B blood test. Sexually transmitted disease (STD) testing. Diabetes screening. This is done by checking your blood sugar (glucose) after you have not  eaten for a while (fasting). You may have this done every 1-3 years. Bone density scan. This is done to screen for osteoporosis. You may have this done starting at age 43. Mammogram. This may be done every 1-2 years. Talk to your health care provider about how often you should have regular mammograms. Talk with your health care provider about your test results, treatment options, and if necessary, the need for more tests. Vaccines  Your health care provider may recommend certain vaccines, such as: Influenza vaccine. This is recommended every year. Tetanus, diphtheria, and acellular pertussis (Tdap, Td) vaccine. You may need a Td booster every 10 years. Zoster vaccine. You may need this after age 59. Pneumococcal 13-valent conjugate (PCV13) vaccine. One dose is recommended after age 81. Pneumococcal polysaccharide (PPSV23) vaccine. One dose is recommended after age 16. Talk to your health care provider about which screenings and vaccines you need and how often you need them. This information is not intended to replace advice given to you by your health care provider. Make sure you discuss any questions you have with your health care provider. Document Released: 03/14/2015 Document Revised: 11/05/2015 Document Reviewed: 12/17/2014 Elsevier Interactive Patient Education  2017 [employer] Prevention in the Home Falls can cause injuries. They can happen to people of all ages. There are many things you can do to make your home safe and to help prevent falls. What can I do on the outside of my home? Regularly fix the edges of walkways and driveways and fix any cracks. Remove anything that might make you trip as you walk through a door, such as a raised step or threshold. Trim any bushes or trees on the path to your home. Use bright outdoor lighting. Clear any walking paths of anything that might make someone trip, such as rocks or tools. Regularly check to see if handrails are loose or  broken. Make sure that both sides of any steps have handrails. Any raised decks and porches should have guardrails on the edges. Have any leaves, snow, or ice cleared regularly. Use sand or salt on walking paths during winter. Clean up any spills in your garage right away. This includes oil or grease spills. What can I do in the bathroom? Use night lights. Install grab bars by the toilet and in  the tub and shower. Do not use towel bars as grab bars. Use non-skid mats or decals in the tub or shower. If you need to sit down in the shower, use a plastic, non-slip stool. Keep the floor dry. Clean up any water that spills on the floor as soon as it happens. Remove soap buildup in the tub or shower regularly. Attach bath mats securely with double-sided non-slip rug tape. Do not have throw rugs and other things on the floor that can make you trip. What can I do in the bedroom? Use night lights. Make sure that you have a light by your bed that is easy to reach. Do not use any sheets or blankets that are too big for your bed. They should not hang down onto the floor. Have a firm chair that has side arms. You can use this for support while you get dressed. Do not have throw rugs and other things on the floor that can make you trip. What can I do in the kitchen? Clean up any spills right away. Avoid walking on wet floors. Keep items that you use a lot in easy-to-reach places. If you need to reach something above you, use a strong step stool that has a grab bar. Keep electrical cords out of the way. Do not use floor polish or wax that makes floors slippery. If you must use wax, use non-skid floor wax. Do not have throw rugs and other things on the floor that can make you trip. What can I do with my stairs? Do not leave any items on the stairs. Make sure that there are handrails on both sides of the stairs and use them. Fix handrails that are broken or loose. Make sure that handrails are as long as  the stairways. Check any carpeting to make sure that it is firmly attached to the stairs. Fix any carpet that is loose or worn. Avoid having throw rugs at the top or bottom of the stairs. If you do have throw rugs, attach them to the floor with carpet tape. Make sure that you have a light switch at the top of the stairs and the bottom of the stairs. If you do not have them, ask someone to add them for you. What else can I do to help prevent falls? Wear shoes that: Do not have high heels. Have rubber bottoms. Are comfortable and fit you well. Are closed at the toe. Do not wear sandals. If you use a stepladder: Make sure that it is fully opened. Do not climb a closed stepladder. Make sure that both sides of the stepladder are locked into place. Ask someone to hold it for you, if possible. Clearly mark and make sure that you can see: Any grab bars or handrails. First and last steps. Where the edge of each step is. Use tools that help you move around (mobility aids) if they are needed. These include: Canes. Walkers. Scooters. Crutches. Turn on the lights when you go into a dark area. Replace any light bulbs as soon as they burn out. Set up your furniture so you have a clear path. Avoid moving your furniture around. If any of your floors are uneven, fix them. If there are any pets around you, be aware of where they are. Review your medicines with your doctor. Some medicines can make you feel dizzy. This can increase your chance of falling. Ask your doctor what other things that you can do to help prevent falls. This information is  not intended to replace advice given to you by your health care provider. Make sure you discuss any questions you have with your health care provider. Document Released: 12/12/2008 Document Revised: 07/24/2015 Document Reviewed: 03/22/2014 Elsevier Interactive Patient Education  2017 [employer].

## 2022-02-01 NOTE — Progress Notes (Signed)
Subjective:   Danielle [name] is a 76 y.o. female who presents for Medicare Annual (Subsequent) preventive examination.  Review of Systems    Virtual Visit via Telephone Note  I connected with  Danielle [name] on 02/01/22 at 12:30 PM EST by telephone and verified that I am speaking with the correct person using two identifiers.  Location: Patient: Home Provider: Office Persons participating in the virtual visit: patient/Nurse Health Advisor   I discussed the limitations, risks, security and privacy concerns of performing an evaluation and management service by telephone and the availability of in person appointments. The patient expressed understanding and agreed to proceed.  Interactive audio and video telecommunications were attempted between this nurse and patient, however failed, due to patient having technical difficulties OR patient did not have access to video capability.  We continued and completed visit with audio only.  Some vital signs may be absent or patient reported.   Criselda Peaches, LPN  Cardiac Risk Factors include: advanced age (>50mn, >>77women);hypertension;Other (see comment), Risk factor comments: Dx Afib and COPD     Objective:    Today's Vitals   02/01/22 1234  Weight: 220 lb (99.8 kg)  Height: '5\' 7"'$  (1.702 m)   Body mass index is 34.46 kg/m.     02/01/2022   12:48 PM 12/28/2021    3:46 PM 01/19/2021   10:37 AM 12/08/2020   10:29 AM 11/17/2020    6:15 AM 09/19/2020    3:40 PM 08/19/2020   10:31 AM  Advanced Directives  Does Patient Have a Medical Advance Directive? No No Yes Yes Yes Yes Yes  Type of AComptrollerLiving will [place] will [place] will [place] will Living will  Does patient want to make changes to medical advance directive?      No - Patient declined Yes (ED - Information included in AVS)   Copy of H[place]in Chart?   No - copy requested No - copy requested No - copy requested No - copy requested   Would patient like information on creating a medical advance directive? No - Patient declined No - Patient declined         Current Medications (verified) Outpatient Encounter Medications as of 02/01/2022  Medication Sig   albuterol (PROVENTIL HFA;VENTOLIN HFA) 108 (90 Base) MCG/ACT inhaler Inhale 2 puffs into the lungs every 6 (six) hours as needed for wheezing or shortness of breath.   alendronate (FOSAMAX) 70 MG tablet Take 1 tablet (70 mg total) by mouth once a week. Take with a full glass of water on an empty stomach.   apixaban (ELIQUIS) 5 MG TABS tablet Take 1 tablet (5 mg total) by mouth 2 (two) times daily.   ascorbic acid (VITAMIN C) 500 MG tablet Take 500 mg by mouth daily.   budesonide-formoterol (SYMBICORT) 160-4.5 MCG/ACT inhaler Inhale 2 puffs into the lungs daily as needed.   carvedilol (COREG) 25 MG tablet Take 1 tablet (25 mg total) by mouth 2 (two) times daily with a meal.   Cholecalciferol 25 MCG (1000 UT) tablet Take 1,000 Units by mouth daily.   cyclobenzaprine (FLEXERIL) 5 MG tablet Take 1 tablet (5 mg total) by mouth 2 (two) times daily as needed for muscle spasms.   fluticasone (FLONASE) 50 MCG/ACT nasal spray Place 1 spray into both nostrils daily.   lidocaine (LIDODERM) 5 % Place 1 patch onto the skin daily. Remove &  Discard patch within 12 hours or as directed by MD   methimazole (TAPAZOLE) 5 MG tablet Take 3 tablets (15 mg total) by mouth daily.   Multiple Vitamin (MULTIVITAMIN) tablet Take 1 tablet by mouth daily.   pantoprazole (PROTONIX) 40 MG tablet Take 1 tablet (40 mg total) by mouth daily.   rosuvastatin (CRESTOR) 40 MG tablet Take 1 tablet (40 mg total) by mouth daily.   sacubitril-valsartan (ENTRESTO) 49-51 MG Take 1 tablet by mouth 2 (two) times daily.   senna-docusate (SENOKOT-S) 8.6-50 MG tablet Take 2 tablets by mouth at  bedtime.   spironolactone (ALDACTONE) 25 MG tablet Take 0.5 tablets (12.5 mg total) by mouth daily.   torsemide (DEMADEX) 20 MG tablet Take 1 tablet (20 mg total) by mouth as needed (weight gain / swelling).   traMADol (ULTRAM) 50 MG tablet Take 1 tablet (50 mg total) by mouth 3 (three) times daily.   trolamine salicylate (ASPERCREME) 10 % cream Apply 1 application topically as needed for muscle pain.   valACYclovir (VALTREX) 500 MG tablet Take on tablet by mouth twice a day for 3 days as needed for an outbreak   zolpidem (AMBIEN) 5 MG tablet Take 0.5-1 tablets (2.5-5 mg total) by mouth at bedtime as needed for sleep.   No facility-administered encounter medications on file as of 02/01/2022.    Allergies (verified) Lisinopril, Tetanus toxoid adsorbed, Other, and Peanut allergen powder-dnfp   History: Past Medical History:  Diagnosis Date   Anticoagulated on Coumadin    managed by cardiology   Anxiety    Chronic constipation    Chronic systolic CHF (congestive heart failure) ([place]) 2016   cardiologist--- dr end and followed by CHF clinic   CKD (chronic kidney disease), stage III ([place])    COPD (chronic obstructive pulmonary disease) ([place])    previous had seen pulmonology --- dr Melvyn Novas, note in epic 03-14-2016, dx COPD GOLD 0   Coronary artery disease    cardiologist--- dr Durward Mallard;  last cardiac cath 01-10-20202 mild nonobstructive disease proxLAD;  per pt, had LHC prior to AVR in [place] 2016   DOE (dyspnea on exertion)    GERD (gastroesophageal reflux disease)    History of 2019 novel coronavirus disease (COVID-19) 12/19/2019   hospital admission w/ covid pneumonia , no intubatiion,  per pt symptoms resolved and back to baseline   History of cerebrovascular accident (CVA) with residual deficit    CVA in 2000 without residuals;  CVA post op AVR 01/ 2016 with residual right hand weakness   History of gastritis    History of pneumothorax    s/p right vats 11-19-2014 and  s/p left vats   12-13-2015   (both spontenaous due to bleb)   Hyperlipidemia    Hypertension    Hyperthyroidism    per pt followed by pcp,  dx approx 2014   Malignant neoplasm of upper-outer quadrant of left breast in female, estrogen receptor positive ([place]) 11/2016   oncologist-- dr Burr Medico--- dx 10/ 2018  ,  s/p left lumptectomy with node dissection;  completed radiation 04-15-2017, no chemo   Multiple thyroid nodules    in care everywhere pt had left thyroid nodule biospy 09-18-2014 benign   NICM (nonischemic cardiomyopathy) ([place]) 2016   2016  ef 30%;  last echo in epic ef 45%   OA (osteoarthritis)    Osteoporosis    Paroxysmal atrial fibrillation [facility])    cardiologist--- dr end   Personal history of radiation therapy    left breast cancer  03-08-2017  to 04-05-2017   Thrombocytopenia [facility])    followed by dr Burr Medico   Valvular stenosis, aortic 03/12/2014   Status post bioprosthetic AVR  in [place]  for severe AS   Wears dentures    full upper   Past Surgical History:  Procedure Laterality Date   AORTIC VALVE REPLACEMENT  03/12/2014   [facility] [place];  St Jude 57m, medial Trifecta Bioprosthesis   BREAST LUMPECTOMY WITH RADIOACTIVE SEED AND SENTINEL LYMPH NODE BIOPSY Left 01/14/2017   Procedure: LEFT BREAST LUMPECTOMY WITH RADIOACTIVE SEED AND SENTINEL LYMPH NODE BIOPSY;  Surgeon: WRolm Bookbinder MD;  [facility]  Service: General;  Laterality: Left;   CARDIAC CATHETERIZATION  02/12/2014   [facility] [place] 11/17/2020   Procedure: DILATATION AND CURETTAGE /HYSTEROSCOPY WITH MYOSURE;  Surgeon: ANunzio Cobbs MD;  [facility]  Service: Gynecology;  Laterality: N/A;   INTRAMEDULLARY (IM) NAIL INTERTROCHANTERIC Left 12/10/2015   Procedure: INTRAMEDULLARY (IM) NAIL INTERTROCHANTRIC;  Surgeon: NLeandrew Koyanagi MD;  [facility] [facility]  Service: Orthopedics;  Laterality: Left;   OPERATIVE ULTRASOUND N/A 11/17/2020    Procedure: OPERATIVE ULTRASOUND;  Surgeon: ANunzio Cobbs MD;  [facility]  Service: Gynecology;  Laterality: N/A;   PLEURADESIS Left 12/03/2015   Procedure: PLEURADESIS;  Surgeon: SMelrose Nakayama MD;  [facility]  Service: Thoracic;  Laterality: Left;   RESECTION OF APICAL BLEB Left 12/03/2015   Procedure: BLEBECTOMY;  Surgeon: SMelrose Nakayama MD;  [facility]  Service: Thoracic;  Laterality: Left;   RIGHT/LEFT HEART CATH AND CORONARY ANGIOGRAPHY N/A 03/10/2020   Procedure: RIGHT/LEFT HEART CATH AND CORONARY ANGIOGRAPHY;  Surgeon: ENelva Bush MD;  [facility];  Service: Cardiovascular;  Laterality: N/A;   VIDEO ASSISTED THORACOSCOPY Left 12/03/2015   Procedure: VIDEO ASSISTED THORACOSCOPY;  Surgeon: SMelrose Nakayama MD;  [facility];  Service: Thoracic;  Laterality: Left;   VIDEO ASSISTED THORACOSCOPY (VATS) W/TALC PLEUADESIS Right 11/19/2014   [facility]  Family History  Problem Relation Age of Onset   Diabetes Mother    Heart attack Mother 671  Diabetes Father    Lung cancer Father    Diabetes Sister    Thyroid disease Sister    Diabetes Sister    HIV Brother    Colon polyps Neg Hx    Colon cancer Neg Hx    Social History   Socioeconomic History   Marital status: Married    Spouse name: [place]   Number of children: 0   Years of education: 12   Highest education level: Bachelor's degree (e.g., BA, AB, BS)  Occupational History   Occupation: Retired in 2004  Tobacco Use   Smoking status: Former    Packs/day: 0.25    Years: 10.00    Total pack years: 2.50    Types: Cigarettes    Quit date: 2012    Years since quitting: 11.9   Smokeless tobacco: Never  Vaping Use   Vaping Use: Never used  Substance and Sexual Activity   Alcohol use: No   Drug use: Never   Sexual activity: Yes    Birth control/protection: Post-menopausal  Other Topics Concern   Not on file  Social  History Narrative   Lives with husband.  Ambulated independently.   Right-handed.   No daily caffeine use.   Social Determinants of  Health   Financial Resource Strain: Low Risk  (02/01/2022)   Overall Financial Resource Strain (CARDIA)    Difficulty of Paying Living Expenses: Not hard at all  Recent Concern: Financial Resource Strain - Medium Risk (01/25/2022)   Overall Financial Resource Strain (CARDIA)    Difficulty of Paying Living Expenses: Somewhat hard  Food Insecurity: No Food Insecurity (02/01/2022)   Hunger Vital Sign    Worried About Running Out of Food in the Last Year: Never true    Ran Out of Food in the Last Year: Never true  Transportation Needs: No Transportation Needs (02/01/2022)   PRAPARE - Hydrologist (Medical): No    Lack of Transportation (Non-Medical): No  Physical Activity: Inactive (02/01/2022)   Exercise Vital Sign    Days of Exercise per Week: 0 days    Minutes of Exercise per Session: 0 min  Stress: No Stress Concern Present (02/01/2022)   [place]    Feeling of Stress : Not at all  Social Connections: [place] (02/01/2022)   Social Connection and Isolation Panel [NHANES]    Frequency of Communication with Friends and Family: More than three times a week    Frequency of Social Gatherings with Friends and Family: More than three times a week    Attends Religious Services: More than 4 times per year    Active Member of [employer] or Organizations: Yes    Attends Music therapist: More than 4 times per year    Marital Status: Married    Tobacco Counseling Counseling given: Not Answered   Clinical Intake:  Pre-visit preparation completed: No  Pain : No/denies pain     BMI - recorded: 34.46 Nutritional Status: BMI > 30  Obese Nutritional Risks: None Diabetes: No  How often do you need to have someone help you when you read  instructions, pamphlets, or other written materials from your doctor or pharmacy?: 1 - Never  Diabetic?  No  Interpreter Needed?: No  Information entered by :: Rolene Arbour LPN   Activities of Daily Living    02/01/2022   12:40 PM  In your present state of health, do you have any difficulty performing the following activities:  Hearing? 0  Vision? 0  Difficulty concentrating or making decisions? 0  Walking or climbing stairs? 1  Comment Uses cane, walker and wheelchair. Followed Orthopedic  Dressing or bathing? 0  Doing errands, shopping? 1  Comment Chief Executive Officer and eating ? N  Using the Toilet? N  In the past six months, have you accidently leaked urine? Y  Comment Wears pull ups. Followed by PCP  Do you have problems with loss of bowel control? N  Managing your Medications? N  Managing your Finances? N  Comment Husband assist  Housekeeping or managing your Housekeeping? Y  Comment Husband assist and cleaning service    Patient Care Team: [name], [name], MD as PCP - General (Family Medicine) End, Harrell Gave, MD as PCP - Cardiology (Cardiology) [name], MD as Consulting Physician (Hematology) Kyung Rudd, MD as Consulting Physician (Radiation Oncology) Rolm Bookbinder, MD as Consulting Physician (General Surgery) [name], Charlestine Massed, NP as Nurse Practitioner (Hematology and Oncology) Viona Gilmore, [facility] as Pharmacist (Pharmacist) Alla Feeling, NP as Nurse Practitioner (Nurse Practitioner)  Indicate any recent Medical Services you may have received from other than [facility] providers in the past year (date may be approximate).  Assessment:   This is a routine wellness examination for Danielle [name].  Hearing/Vision screen Hearing Screening - Comments:: Denies hearing difficulties   Vision Screening - Comments:: Wears reading glasses - up to date with routine eye exams with  Patient deferred  Dietary issues and  exercise activities discussed: Exercise limited by: orthopedic condition(s)   Goals Addressed               This Visit's Progress     Patient stated (pt-stated)        I want to be able to walk on my own without medical devices.       Depression Screen    02/01/2022   12:39 PM 12/17/2021   10:36 AM 10/27/2021    9:35 AM 07/28/2021    9:52 AM 05/04/2021   11:25 AM 04/30/2021   10:01 AM 04/13/2021    9:17 AM  PHQ 2/9 Scores  PHQ - 2 Score 0 0 0 0 3 0 0  PHQ- 9 Score     7  11    Fall Risk    02/01/2022   12:47 PM 01/29/2022    9:13 AM 12/17/2021   10:36 AM 10/27/2021    9:35 AM 07/28/2021    9:52 AM  Fall Risk   Falls in the past year? 0 0 0 0 0  Number falls in past yr: 0  0    Injury with Fall? 0  0    Risk for fall due to : No Fall Risks      Follow up Falls prevention discussed        [place]:  Any stairs in or around the home? No  If so, are there any without handrails? No  Home free of loose throw rugs in walkways, pet beds, electrical cords, etc? Yes  Adequate lighting in your home to reduce risk of falls? Yes   ASSISTIVE DEVICES UTILIZED TO PREVENT FALLS:  Life alert? No  Use of a cane, walker or w/c? Yes  Grab bars in the bathroom? Yes  Shower chair or bench in shower? Yes  Elevated toilet seat or a handicapped toilet? No   TIMED UP AND GO:  Was the test performed? No . Audio Visit  Cognitive Function:        02/01/2022   12:49 PM 01/19/2021   10:40 AM 12/05/2019    1:42 PM  6CIT Screen  What Year? 0 points 0 points 0 points  What month? 0 points 0 points 0 points  What time? 0 points 0 points   Count back from 20 4 points 4 points 0 points  Months in reverse 4 points 4 points 4 points  Repeat phrase 0 points 2 points 2 points  Total Score 8 points 10 points     Immunizations Immunization History  Administered Date(s) Administered   Fluad Quad(high Dose 65+) 11/20/2018, 12/08/2020   Influenza, High  Dose Seasonal PF 11/24/2016, 12/08/2017   Influenza-Unspecified 12/07/2015, 11/09/2019   Moderna SARS-COV2 Booster Vaccination 02/28/2020   Moderna Sars-Covid-2 Vaccination 03/24/2019, 04/28/2019, 07/02/2020   Pneumococcal Conjugate-13 11/13/2013, 11/21/2014, 12/15/2018   Pneumococcal Polysaccharide-23 12/15/2012, 05/20/2016   Zoster Recombinat (Shingrix) 12/21/2017, 12/15/2018   Zoster, Live 03/01/2014    TDAP status: Due, Education has been provided regarding the importance of this vaccine. Advised may receive this vaccine at local pharmacy or Health Dept. Aware to provide a copy of the vaccination record if obtained from local pharmacy or Health Dept. Verbalized  acceptance and understanding.  Flu Vaccine status: Completed at today's visit  Pneumococcal vaccine status: Up to date  Covid-19 vaccine status: Completed vaccines  Qualifies for Shingles Vaccine? Yes   Zostavax completed Yes   Shingrix Completed?: Yes  Screening Tests Health Maintenance  Topic Date Due   DTaP/Tdap/Td (1 - Tdap) Never done   COVID-19 Vaccine (4 - 2023-24 season) 02/17/2022 (Originally 10/30/2021)   INFLUENZA VACCINE  05/30/2022 (Originally 09/29/2021)   Medicare Annual Wellness (AWV)  02/02/2023   Pneumonia Vaccine 9+ Years old  Completed   DEXA SCAN  Completed   Hepatitis C Screening  Completed   Zoster Vaccines- Shingrix  Completed   HPV VACCINES  Aged Out    Health Maintenance  Health Maintenance Due  Topic Date Due   DTaP/Tdap/Td (1 - Tdap) Never done    Colorectal cancer screening: No longer required.   Mammogram status: No longer required due to Age.  Bone Density status: Completed 01/14/21. Results reflect: Bone density results: OSTEOPOROSIS. Repeat every   years.  Lung Cancer Screening: (Low Dose CT Chest recommended if Age 86-80 years, 30 pack-year currently smoking OR have quit w/in 15years.) does not qualify.     Additional Screening:  Hepatitis C Screening: does qualify;  Completed 01/04/18  Vision Screening: Recommended annual ophthalmology exams for early detection of glaucoma and other disorders of the eye. Is the patient up to date with their annual eye exam?  No Who is the provider or what is the name of the office in which the patient attends annual eye exams? Patient deferred If pt is not established with a provider, would they like to be referred to a provider to establish care? No .   Dental Screening: Recommended annual dental exams for proper oral hygiene  Community Resource Referral / Chronic Care Management:  CRR required this visit?  No   CCM required this visit?  No      Plan:     I have personally reviewed and noted the following in the patient's chart:   Medical and social history Use of alcohol, tobacco or illicit drugs  Current medications and supplements including opioid prescriptions. Patient is currently taking opioid prescriptions. Information provided to patient regarding non-opioid alternatives. Patient advised to discuss non-opioid treatment plan with their provider. Functional ability and status Nutritional status Physical activity Advanced directives List of other physicians Hospitalizations, surgeries, and ER visits in previous 12 months Vitals Screenings to include cognitive, depression, and falls Referrals and appointments  In addition, I have reviewed and discussed with patient certain preventive protocols, quality metrics, and best practice recommendations. A written personalized care plan for preventive services as well as general preventive health recommendations were provided to patient.     Criselda Peaches, LPN   49/08/261   Nurse Notes: Patient due DTaP/Tdap/Td(-Tdap)

## 2022-02-04 ENCOUNTER — Ambulatory Visit: Payer: Medicare PPO | Admitting: Orthopedic Surgery

## 2022-02-08 NOTE — Progress Notes (Deleted)
HPI: Danielle [name] is a 76 y.o. female, who is here today for her routine physical.  Last CPE: ***  Regular exercise 3 or more time per week: *** Following a healthy diet: ***  Chronic medical problems: ***  Immunization History  Administered Date(s) Administered   Fluad Quad(high Dose 65+) 11/20/2018, 12/08/2020   Influenza, High Dose Seasonal PF 11/24/2016, 12/08/2017   Influenza-Unspecified 12/07/2015, 11/09/2019   Moderna SARS-COV2 Booster Vaccination 02/28/2020   Moderna Sars-Covid-2 Vaccination 03/24/2019, 04/28/2019, 07/02/2020   Pneumococcal Conjugate-13 11/13/2013, 11/21/2014, 12/15/2018   Pneumococcal Polysaccharide-23 12/15/2012, 05/20/2016   Zoster Recombinat (Shingrix) 12/21/2017, 12/15/2018   Zoster, Live 03/01/2014   Health Maintenance  Topic Date Due   DTaP/Tdap/Td (1 - Tdap) Never done   COVID-19 Vaccine (4 - 2023-24 season) 02/17/2022 (Originally 10/30/2021)   INFLUENZA VACCINE  05/30/2022 (Originally 09/29/2021)   Medicare Annual Wellness (AWV)  02/02/2023   Pneumonia Vaccine 39+ Years old  Completed   DEXA SCAN  Completed   Hepatitis C Screening  Completed   Zoster Vaccines- Shingrix  Completed   HPV VACCINES  Aged Out    She has *** concerns today.  Review of Systems  Current Outpatient Medications on File Prior to Visit  Medication Sig Dispense Refill   albuterol (PROVENTIL HFA;VENTOLIN HFA) 108 (90 Base) MCG/ACT inhaler Inhale 2 puffs into the lungs every 6 (six) hours as needed for wheezing or shortness of breath.     alendronate (FOSAMAX) 70 MG tablet Take 1 tablet (70 mg total) by mouth once a week. Take with a full glass of water on an empty stomach. 13 tablet 3   apixaban (ELIQUIS) 5 MG TABS tablet Take 1 tablet (5 mg total) by mouth 2 (two) times daily. 180 tablet 3   ascorbic acid (VITAMIN C) 500 MG tablet Take 500 mg by mouth daily.     budesonide-formoterol (SYMBICORT) 160-4.5 MCG/ACT inhaler Inhale 2 puffs into the lungs  daily as needed. 3 each 3   carvedilol (COREG) 25 MG tablet Take 1 tablet (25 mg total) by mouth 2 (two) times daily with a meal. 180 tablet 3   Cholecalciferol 25 MCG (1000 UT) tablet Take 1,000 Units by mouth daily.     cyclobenzaprine (FLEXERIL) 5 MG tablet Take 1 tablet (5 mg total) by mouth 2 (two) times daily as needed for muscle spasms. 20 tablet 0   fluticasone (FLONASE) 50 MCG/ACT nasal spray Place 1 spray into both nostrils daily. 16 g 3   lidocaine (LIDODERM) 5 % Place 1 patch onto the skin daily. Remove & Discard patch within 12 hours or as directed by MD 14 patch 0   methimazole (TAPAZOLE) 5 MG tablet Take 3 tablets (15 mg total) by mouth daily. 270 tablet 3   Multiple Vitamin (MULTIVITAMIN) tablet Take 1 tablet by mouth daily.     pantoprazole (PROTONIX) 40 MG tablet Take 1 tablet (40 mg total) by mouth daily. 30 tablet 11   rosuvastatin (CRESTOR) 40 MG tablet Take 1 tablet (40 mg total) by mouth daily. 90 tablet 3   sacubitril-valsartan (ENTRESTO) 49-51 MG Take 1 tablet by mouth 2 (two) times daily. 60 tablet 4   senna-docusate (SENOKOT-S) 8.6-50 MG tablet Take 2 tablets by mouth at bedtime.     spironolactone (ALDACTONE) 25 MG tablet Take 0.5 tablets (12.5 mg total) by mouth daily. 90 tablet 3   torsemide (DEMADEX) 20 MG tablet Take 1 tablet (20 mg total) by mouth as needed (weight gain / swelling).  traMADol (ULTRAM) 50 MG tablet Take 1 tablet (50 mg total) by mouth 3 (three) times daily. 90 tablet 5   trolamine salicylate (ASPERCREME) 10 % cream Apply 1 application topically as needed for muscle pain.     valACYclovir (VALTREX) 500 MG tablet Take on tablet by mouth twice a day for 3 days as needed for an outbreak 30 tablet 0   zolpidem (AMBIEN) 5 MG tablet Take 0.5-1 tablets (2.5-5 mg total) by mouth at bedtime as needed for sleep. 15 tablet 0   No current facility-administered medications on file prior to visit.    Past Medical History:  Diagnosis Date   Anticoagulated  on Coumadin    managed by cardiology   Anxiety    Chronic constipation    Chronic systolic CHF (congestive heart failure) ([place]) 2016   cardiologist--- dr end and followed by CHF clinic   CKD (chronic kidney disease), stage III ([place])    COPD (chronic obstructive pulmonary disease) ([place])    previous had seen pulmonology --- dr Melvyn Novas, note in epic 03-14-2016, dx COPD GOLD 0   Coronary artery disease    cardiologist--- dr Durward Mallard;  last cardiac cath 01-10-20202 mild nonobstructive disease proxLAD;  per pt, had LHC prior to AVR in [place] 2016   DOE (dyspnea on exertion)    GERD (gastroesophageal reflux disease)    History of 2019 novel coronavirus disease (COVID-19) 12/19/2019   hospital admission w/ covid pneumonia , no intubatiion,  per pt symptoms resolved and back to baseline   History of cerebrovascular accident (CVA) with residual deficit    CVA in 2000 without residuals;  CVA post op AVR 01/ 2016 with residual right hand weakness   History of gastritis    History of pneumothorax    s/p right vats 11-19-2014 and  s/p left vats  12-13-2015   (both spontenaous due to bleb)   Hyperlipidemia    Hypertension    Hyperthyroidism    per pt followed by pcp,  dx approx 2014   Malignant neoplasm of upper-outer quadrant of left breast in female, estrogen receptor positive ([place]) 11/2016   oncologist-- dr Burr Medico--- dx 10/ 2018  ,  s/p left lumptectomy with node dissection;  completed radiation 04-15-2017, no chemo   Multiple thyroid nodules    in care everywhere pt had left thyroid nodule biospy 09-18-2014 benign   NICM (nonischemic cardiomyopathy) ([place]) 2016   2016  ef 30%;  last echo in epic ef 45%   OA (osteoarthritis)    Osteoporosis    Paroxysmal atrial fibrillation [facility])    cardiologist--- dr end   Personal history of radiation therapy    left breast cancer  03-08-2017  to 04-05-2017   Thrombocytopenia [facility])    followed by dr Burr Medico   Valvular stenosis, aortic 03/12/2014   Status post  bioprosthetic AVR  in [place]  for severe AS   Wears dentures    full upper    Past Surgical History:  Procedure Laterality Date   AORTIC VALVE REPLACEMENT  03/12/2014   [facility] [place];  St Jude 44m, medial Trifecta Bioprosthesis   BREAST LUMPECTOMY WITH RADIOACTIVE SEED AND SENTINEL LYMPH NODE BIOPSY Left 01/14/2017   Procedure: LEFT BREAST LUMPECTOMY WITH RADIOACTIVE SEED AND SENTINEL LYMPH NODE BIOPSY;  Surgeon: WRolm Bookbinder MD;  Location: [place]  Service: General;  Laterality: Left;   CARDIAC CATHETERIZATION  02/12/2014   [facility] [place] N/A 11/17/2020  Procedure: DILATATION AND CURETTAGE /HYSTEROSCOPY WITH MYOSURE;  Surgeon: Nunzio Cobbs, MD;  Location: [facility];  Service: Gynecology;  Laterality: N/A;   INTRAMEDULLARY (IM) NAIL INTERTROCHANTERIC Left 12/10/2015   Procedure: INTRAMEDULLARY (IM) NAIL INTERTROCHANTRIC;  Surgeon: Leandrew Koyanagi, MD;  Location: [place];  Service: Orthopedics;  Laterality: Left;   OPERATIVE ULTRASOUND N/A 11/17/2020   Procedure: OPERATIVE ULTRASOUND;  Surgeon: Nunzio Cobbs, MD;  Location: [facility];  Service: Gynecology;  Laterality: N/A;   PLEURADESIS Left 12/03/2015   Procedure: PLEURADESIS;  Surgeon: Melrose Nakayama, MD;  Location: [place];  Service: Thoracic;  Laterality: Left;   RESECTION OF APICAL BLEB Left 12/03/2015   Procedure: BLEBECTOMY;  Surgeon: Melrose Nakayama, MD;  Location: [place];  Service: Thoracic;  Laterality: Left;   RIGHT/LEFT HEART CATH AND CORONARY ANGIOGRAPHY N/A 03/10/2020   Procedure: RIGHT/LEFT HEART CATH AND CORONARY ANGIOGRAPHY;  Surgeon: [name], MD;  Location: [place] [facility];  Service: Cardiovascular;  Laterality: N/A;   VIDEO ASSISTED THORACOSCOPY Left 12/03/2015   Procedure: VIDEO ASSISTED THORACOSCOPY;  Surgeon: Melrose Nakayama, MD;  Location: [facility] OR;  Service: Thoracic;  Laterality:  Left;   VIDEO ASSISTED THORACOSCOPY (VATS) W/TALC PLEUADESIS Right 11/19/2014   in [place]    Allergies  Allergen Reactions   Lisinopril Cough   Tetanus Toxoid Adsorbed Swelling    Arm swelling   Other Cough    PEANUTS   Peanut Allergen Powder-Dnfp Cough    Family History  Problem Relation Age of Onset   Diabetes Mother    Heart attack Mother 36   Diabetes Father    Lung cancer Father    Diabetes Sister    Thyroid disease Sister    Diabetes Sister    HIV Brother    Colon polyps Neg Hx    Colon cancer Neg Hx     Social History   Socioeconomic History   Marital status: Married    Spouse name: Sherwood   Number of children: 0   Years of education: 12   Highest education level: Bachelor's degree (e.g., BA, AB, BS)  Occupational History   Occupation: Retired in 2004  Tobacco Use   Smoking status: Former    Packs/day: 0.25    Years: 10.00    Total pack years: 2.50    Types: Cigarettes    Quit date: 2012    Years since quitting: 11.9   Smokeless tobacco: Never  Vaping Use   Vaping Use: Never used  Substance and Sexual Activity   Alcohol use: No   Drug use: Never   Sexual activity: Yes    Birth control/protection: Post-menopausal  Other Topics Concern   Not on file  Social History Narrative   Lives with husband.  Ambulated independently.   Right-handed.   No daily caffeine use.   Social Determinants of Health   Financial Resource Strain: Low Risk  (02/01/2022)   Overall Financial Resource Strain (CARDIA)    Difficulty of Paying Living Expenses: Not hard at all  Recent Concern: Financial Resource Strain - Medium Risk (01/25/2022)   Overall Financial Resource Strain (CARDIA)    Difficulty of Paying Living Expenses: Somewhat hard  Food Insecurity: No Food Insecurity (02/01/2022)   Hunger Vital Sign    Worried About Running Out of Food in the Last Year: Never true    Ran Out of Food in the Last Year: Never true  Transportation Needs: No Transportation  Needs (  02/01/2022)   PRAPARE - Hydrologist (Medical): No    Lack of Transportation (Non-Medical): No  Physical Activity: Inactive (02/01/2022)   Exercise Vital Sign    Days of Exercise per Week: 0 days    Minutes of Exercise per Session: 0 min  Stress: No Stress Concern Present (02/01/2022)   [place]    Feeling of Stress : Not at all  Social Connections: [place] (02/01/2022)   Social Connection and Isolation Panel [NHANES]    Frequency of Communication with Friends and Family: More than three times a week    Frequency of Social Gatherings with Friends and Family: More than three times a week    Attends Religious Services: More than 4 times per year    Active Member of [employer] or Organizations: Yes    Attends Music therapist: More than 4 times per year    Marital Status: Married    There were no vitals filed for this visit. There is no height or weight on file to calculate BMI.  Wt Readings from Last 3 Encounters:  02/01/22 220 lb (99.8 kg)  12/28/21 231 lb (104.8 kg)  12/17/21 231 lb (104.8 kg)    Physical Exam  ASSESSMENT AND PLAN: Ms. Calie Buttrey was here today annual physical examination.  No orders of the defined types were placed in this encounter.   There are no diagnoses linked to this encounter.  There are no diagnoses linked to this encounter.  No follow-ups on file.  [name], MD  [facility]. [place] office.

## 2022-02-09 ENCOUNTER — Encounter: Payer: Medicare PPO | Admitting: Family Medicine

## 2022-02-09 ENCOUNTER — Encounter (HOSPITAL_COMMUNITY): Payer: Self-pay

## 2022-02-10 ENCOUNTER — Ambulatory Visit (HOSPITAL_COMMUNITY)
Admission: RE | Admit: 2022-02-10 | Discharge: 2022-02-10 | Disposition: A | Discharge status: Home / Self Care | Payer: Medicare PPO | Source: Ambulatory Visit | Attending: Cardiology | Admitting: Cardiology

## 2022-02-10 DIAGNOSIS — I5022 Chronic systolic (congestive) heart failure: Secondary | ICD-10-CM | POA: Diagnosis not present

## 2022-02-10 LAB — BASIC METABOLIC PANEL
Anion gap: 7 (ref 5–15)
BUN: 10 mg/dL (ref 8–23)
CO2: 23 mmol/L (ref 22–32)
Calcium: 9.9 mg/dL (ref 8.9–10.3)
Chloride: 109 mmol/L (ref 98–111)
Creatinine, Ser: 1.39 mg/dL — ABNORMAL HIGH (ref 0.44–1.00)
GFR, Estimated: 39 mL/min — ABNORMAL LOW (ref >60)
Glucose, Bld: 102 mg/dL — ABNORMAL HIGH (ref 70–99)
Potassium: 4.8 mmol/L (ref 3.5–5.1)
Sodium: 139 mmol/L (ref 135–145)

## 2022-02-11 ENCOUNTER — Other Ambulatory Visit: Payer: Self-pay | Admitting: Physical Medicine & Rehabilitation

## 2022-02-11 DIAGNOSIS — M159 Polyosteoarthritis, unspecified: Secondary | ICD-10-CM

## 2022-02-11 DIAGNOSIS — G894 Chronic pain syndrome: Secondary | ICD-10-CM

## 2022-02-11 NOTE — Telephone Encounter (Signed)
Patient requesting refill on tramadol last fill per pmp was 12/10/21   Filled  Written  ID  Drug  QTY  Days  Prescriber  RX #  Dispenser  Refill  Daily Dose*  Pymt Type  PMP  12/10/2021 07/28/2021 1  Tramadol Hcl 50 Mg Tablet 90.00 30 An [place] [id number] [facility] (2001) 1/5 30.00 MME Medicare [place]

## 2022-02-17 NOTE — Progress Notes (Signed)
Spoke with Danielle [name] at [employer] concerning patients pap for [place], he states they do not have an application on file for 2024.  Spoke with patient, she states she has received the application and trying to find her tax information for proof of income. As soon as she has this she will take this to [facility] FNP.

## 2022-02-18 NOTE — Telephone Encounter (Signed)
Next appt 03/04/22

## 2022-02-24 ENCOUNTER — Telehealth (HOSPITAL_COMMUNITY): Payer: Self-pay

## 2022-02-24 NOTE — Telephone Encounter (Signed)
Advanced Heart Failure Patient Advocate Encounter  Patient requested a call about Entresto. She states that she has sent in all renewal forms for [employer] and is working on providing POI. Office does not need to submit any paperwork at this time, but the patient does need samples. Provided 1 month.  Medication Samples have been left at registration desk for patient pick up. Drug name: Delene Loll 49-51 MG Qty: 2X 28 ct package LOT: [id number] Exp.: 07/24 SIG: Take 1 tablet by mouth twice daily   The patient has been instructed regarding the correct time, dose, and frequency of taking this medication, including desired effects and most common side effects.   Clista Bernhardt, CPhT Rx Patient Advocate Phone: [phone]

## 2022-03-04 ENCOUNTER — Encounter: Payer: Self-pay | Admitting: Physical Medicine & Rehabilitation

## 2022-03-04 ENCOUNTER — Encounter: Payer: Medicare PPO | Attending: Physical Medicine & Rehabilitation | Admitting: Physical Medicine & Rehabilitation

## 2022-03-04 VITALS — BP 150/88 | HR 99 | Ht 67.0 in | Wt 222.0 lb

## 2022-03-04 DIAGNOSIS — M1712 Unilateral primary osteoarthritis, left knee: Secondary | ICD-10-CM

## 2022-03-04 DIAGNOSIS — G894 Chronic pain syndrome: Secondary | ICD-10-CM

## 2022-03-04 DIAGNOSIS — M159 Polyosteoarthritis, unspecified: Secondary | ICD-10-CM | POA: Diagnosis not present

## 2022-03-04 MED ORDER — HYLAN G-F 20 16 MG/2ML IX SOSY
16.0000 mg | PREFILLED_SYRINGE | Freq: Once | INTRA_ARTICULAR | Status: AC
  Administered 2022-03-04: 16 mg via INTRA_ARTICULAR

## 2022-03-04 MED ORDER — TRAMADOL HCL 50 MG PO TABS: 50.0000 mg | ORAL_TABLET | Freq: Three times a day (TID) | ORAL | 5 refills | Status: DC

## 2022-03-04 NOTE — Progress Notes (Signed)
Indication end-stage osteoarthritis of the Left  knee with pain that limits mobility and does not respond to oral medications.  Ultrasound guidance, 12 Hz linear transducer, long axis view  Medial aspect of the LEFT  knee was imaged, identified joint space, identified patella, femur, tibia. 25-gauge 1.5 inch needle was inserted under ultrasound guidance and 3 mL of 1% lidocaine were infiltrated into the skin and subcutaneous tissue. Then a 21-gauge, 2 inch needle was inserted along the same needle track Into the joint under direct ultrasound visualization. . 6 mL of Synvisc-1 were injected. Patient tolerated procedure well Post procedure instructions given

## 2022-03-09 ENCOUNTER — Encounter: Payer: Medicare PPO | Admitting: Family Medicine

## 2022-03-17 ENCOUNTER — Telehealth (HOSPITAL_COMMUNITY): Payer: Self-pay | Admitting: Pharmacy Technician

## 2022-03-17 NOTE — Telephone Encounter (Signed)
Advanced Heart Failure Patient Advocate Encounter  Patient left vm requesting Eliquis assistance to be renewed. Called and left the patient a message. Based off previous years, I do not think that she has qualified as of yet. She has to spend 3% OOP before she can qualify for assistance.

## 2022-03-17 NOTE — Telephone Encounter (Signed)
Medication Samples have been provided to the patient.  Drug name: Eliquis       Strength: '5mg'$         Qty: 4 boxes  LOT: [id number]  Exp.Date: 10/2023  Dosing instructions: Take one tablet twice daily.  The patient has been instructed regarding the correct time, dose, and frequency of taking this medication, including desired effects and most common side effects.   [facility] 4:17 PM 03/17/2022

## 2022-03-17 NOTE — Telephone Encounter (Signed)
Advanced Heart Failure Patient Advocate Encounter  Patient called back. I explained the 3% OOP rule for assistance. Will provide a month of samples in the meantime.   [name], CPhT

## 2022-03-23 NOTE — Telephone Encounter (Addendum)
Advanced Heart Failure Patient Advocate Encounter  Sent in [place] assistance application to [employer] along with POI. Document scanned to chart.

## 2022-03-31 NOTE — Telephone Encounter (Signed)
Advanced Heart Failure Patient Advocate [employer] requested clarification of application. MD/RX form updated and faxed on 03/31/22.

## 2022-04-01 NOTE — Telephone Encounter (Signed)
Advanced Heart Failure Patient Advocate Encounter  Patient was approved to receive Entresto from [employer] Effective 03/31/22 to 03/01/23  Medication Samples have been left at registration desk for patient pick up. Drug name: Danielle [name]: 1x 28 ct package LOT: [id number] Exp.: 08/25 SIG: Take 1 tablet by mouth twice daily   The patient has been instructed regarding the correct time, dose, and frequency of taking this medication, including desired effects and most common side effects.

## 2022-04-05 ENCOUNTER — Telehealth: Payer: Self-pay | Admitting: Physical Medicine & Rehabilitation

## 2022-04-05 ENCOUNTER — Other Ambulatory Visit: Payer: Self-pay | Admitting: Physical Medicine & Rehabilitation

## 2022-04-05 DIAGNOSIS — M159 Polyosteoarthritis, unspecified: Secondary | ICD-10-CM

## 2022-04-05 DIAGNOSIS — M170 Bilateral primary osteoarthritis of knee: Secondary | ICD-10-CM

## 2022-04-05 DIAGNOSIS — G894 Chronic pain syndrome: Secondary | ICD-10-CM

## 2022-04-05 NOTE — Telephone Encounter (Signed)
Patient called in requesting status of referral for PT , patient asked to be referred to [facility] for PT

## 2022-04-07 NOTE — Progress Notes (Unsigned)
HPI: [name] is a 77 y.o. female with PMHx significant for hyperthyroidism, HTN,HLD,aortic valve disease s/p valve replacement in 03/2014, PAF on chronic anticoagulation, HFrEF, chronic pain,CKD III, and breast cancer s/p left lumpectomy in 12/2016 here today for her routine physical and follow up.  Last CPE: Over a year ago. Last visit 04/2021.  She does not exercise regularly. She reports having recently stopped physical therapy but plans to resume next month.  She is active at home, walking around the house. She uses a walker She consumes vegetables daily and maintains a healthy diet with small portions.  She reports poor sleep, hx of insomnia. She sleeps about three hours per night. She has tried several medications, she is not taking Ambien, prescribed in 04/2021.  She denies smoking and alcohol consumption.    04/09/2022   10:30 AM 02/01/2022   12:39 PM 12/17/2021   10:36 AM 10/27/2021    9:35 AM 07/28/2021    9:52 AM  Depression screen PHQ 2/9  Decreased Interest 2 0 0 0 0  Down, Depressed, Hopeless 2 0 0 0 0  PHQ - 2 Score 4 0 0 0 0  Altered sleeping 2      Tired, decreased energy 2      Change in appetite 0      Feeling bad or failure about yourself  0      Trouble concentrating 0      Moving slowly or fidgety/restless 2      Suicidal thoughts 0      PHQ-9 Score 10      Difficult doing work/chores Somewhat difficult       Immunization History  Administered Date(s) Administered   Fluad Quad(high Dose 65+) 11/20/2018, 12/08/2020   Influenza, High Dose Seasonal PF 11/24/2016, 12/08/2017   Influenza-Unspecified 12/07/2015, 11/09/2019   Moderna SARS-COV2 Booster Vaccination 02/28/2020   Moderna Sars-Covid-2 Vaccination 03/24/2019, 04/28/2019, 07/02/2020   Pneumococcal Conjugate-13 11/13/2013, 11/21/2014, 12/15/2018   Pneumococcal Polysaccharide-23 12/15/2012, 05/20/2016   Zoster Recombinat (Shingrix) 12/21/2017, 12/15/2018   Zoster, Live 03/01/2014    Health Maintenance  Topic Date Due   COVID-19 Vaccine (4 - 2023-24 season) 10/30/2021   INFLUENZA VACCINE  05/30/2022 (Originally 09/29/2021)   Medicare Annual Wellness (AWV)  02/02/2023   Pneumonia Vaccine 12+ Years old  Completed   DEXA SCAN  Completed   Hepatitis C Screening  Completed   Zoster Vaccines- Shingrix  Completed   HPV VACCINES  Aged Out   DTaP/Tdap/Td  Discontinued   Hyperthyroidism and thyroid nodules. Dx'ed in 2005. She is currently on Methimazole 5 mg 3 tabs daily since 2015. Followed with endocrinologist until 2019. Denies palpitations or tremor. Lab Results  Component Value Date   TSH 0.63 04/13/2021   HLD and aortic atherosclerosis seen on imaging,abdominal CT 07/2018.  She is on Crestor 40 mg daily.  Lab Results  Component Value Date   CHOL 142 02/10/2021   HDL 63 02/10/2021   LDLCALC 67 02/10/2021   TRIG 59 02/10/2021   CHOLHDL 2.3 02/10/2021   HTN,HFrEF,and PAF: Follows with cardiologist q 6 months. She is on Carvedilol 25 mg bid ,Spironolactone 25 mg daily,and Entresto 49-51 mg daily. She is also on Torsemide 20 mg daily prn and Eliquis 5 mg bid. Echo 12/2021 LVEF 50 % and grade 1 diastolic dysfunction. CKD III: She has not noted gross hematuria,foam in urine,or decreased urine output.  Lab Results  Component Value Date   CREATININE 1.39 (H) 02/10/2022   BUN 10 02/10/2022  NA 139 02/10/2022   K 4.8 02/10/2022   CL 109 02/10/2022   CO2 23 02/10/2022   Lab Results  Component Value Date   HGBA1C 5.2 05/04/2021   Since her last visit she has seen pain management for chronic pain.  Osteoporosis, last DEXA, right femoral neck -3.3. She is not taking Fosamax, not sure why.  Colonoscopy over 6 years in [place], reported as negative.  Today she is c/o upper and periumbilical abdominal pain. States that pain began after starting Ozempic for wt loss, which has helped.She has experienced a 30-pound weight loss. She has been off Ozempic for four  weeks and continues to experience pain. It is exacerbated by drinking water.She has not pain when eating foods or other fluids. Bloating sensation exacerbated by food intake. There is no associated nausea or vomiting.  Ozempic also caused diarrhea, improved after discontinuing Ozempic  but not back to her normal. Having soft stools, one daily. Abdominal pain and boating sensation improves after defecation.  After AVS was printed, she c/o heartburn, exacerbated by food intake. She is not longer on Protonix for GERD.Marland Kitchen Has not tried OTC medications.  Breast cancer and thrombocytopenia: She follows with oncologist, next appt in 04/2022. COPD: She has used Symbicort or Albuterol in a while.  Review of Systems  Constitutional:  Positive for fatigue. Negative for chills and fever.  HENT:  Positive for dental problem. Negative for hearing loss, mouth sores, nosebleeds and sore throat.   Eyes:  Negative for redness and visual disturbance.  Respiratory:  Negative for cough, shortness of breath and wheezing.   Cardiovascular:  Negative for chest pain, palpitations and leg swelling.  Gastrointestinal:  Positive for abdominal pain. Negative for blood in stool, nausea and vomiting.  Endocrine: Negative for cold intolerance, heat intolerance, polydipsia, polyphagia and polyuria.  Genitourinary:  Negative for decreased urine volume, dysuria and hematuria.  Musculoskeletal:  Positive for arthralgias, gait problem and myalgias.  Skin:  Negative for color change and rash.  Allergic/Immunologic: Positive for environmental allergies.  Neurological:  Negative for syncope and headaches.  Hematological:  Does not bruise/bleed easily.  Psychiatric/Behavioral:  Positive for sleep disturbance. Negative for confusion. The patient is nervous/anxious.   All other systems reviewed and are negative.  Current Outpatient Medications on File Prior to Visit  Medication Sig Dispense Refill   albuterol (PROVENTIL  HFA;VENTOLIN HFA) 108 (90 Base) MCG/ACT inhaler Inhale 2 puffs into the lungs every 6 (six) hours as needed for wheezing or shortness of breath.     apixaban (ELIQUIS) 5 MG TABS tablet Take 1 tablet (5 mg total) by mouth 2 (two) times daily. 180 tablet 3   ascorbic acid (VITAMIN C) 500 MG tablet Take 500 mg by mouth daily.     budesonide-formoterol (SYMBICORT) 160-4.5 MCG/ACT inhaler Inhale 2 puffs into the lungs daily as needed. 3 each 3   carvedilol (COREG) 25 MG tablet Take 1 tablet (25 mg total) by mouth 2 (two) times daily with a meal. 180 tablet 3   Cholecalciferol 25 MCG (1000 UT) tablet Take 1,000 Units by mouth daily.     cyclobenzaprine (FLEXERIL) 5 MG tablet Take 1 tablet (5 mg total) by mouth 2 (two) times daily as needed for muscle spasms. 20 tablet 0   fluticasone (FLONASE) 50 MCG/ACT nasal spray Place 1 spray into both nostrils daily. 16 g 3   lidocaine (LIDODERM) 5 % Place 1 patch onto the skin daily. Remove & Discard patch within 12 hours or as directed by  MD 14 patch 0   methimazole (TAPAZOLE) 5 MG tablet Take 3 tablets (15 mg total) by mouth daily. 270 tablet 3   Multiple Vitamin (MULTIVITAMIN) tablet Take 1 tablet by mouth daily.     pantoprazole (PROTONIX) 40 MG tablet Take 1 tablet (40 mg total) by mouth daily. 30 tablet 11   rosuvastatin (CRESTOR) 40 MG tablet Take 1 tablet (40 mg total) by mouth daily. 90 tablet 3   sacubitril-valsartan (ENTRESTO) 49-51 MG Take 1 tablet by mouth 2 (two) times daily. 60 tablet 4   senna-docusate (SENOKOT-S) 8.6-50 MG tablet Take 2 tablets by mouth at bedtime.     spironolactone (ALDACTONE) 25 MG tablet Take 0.5 tablets (12.5 mg total) by mouth daily. 90 tablet 3   torsemide (DEMADEX) 20 MG tablet Take 1 tablet (20 mg total) by mouth as needed (weight gain / swelling).     traMADol (ULTRAM) 50 MG tablet TAKE 1 TABLET BY MOUTH THREE TIMES DAILY 90 tablet 0   trolamine salicylate (ASPERCREME) 10 % cream Apply 1 application topically as needed  for muscle pain.     valACYclovir (VALTREX) 500 MG tablet Take on tablet by mouth twice a day for 3 days as needed for an outbreak 30 tablet 0   zolpidem (AMBIEN) 5 MG tablet Take 0.5-1 tablets (2.5-5 mg total) by mouth at bedtime as needed for sleep. 15 tablet 0   No current facility-administered medications on file prior to visit.    Past Medical History:  Diagnosis Date   Anticoagulated on Coumadin    managed by cardiology   Anxiety    Chronic constipation    Chronic systolic CHF (congestive heart failure) ([place]) 2016   cardiologist--- dr end and followed by CHF clinic   CKD (chronic kidney disease), stage III ([place])    COPD (chronic obstructive pulmonary disease) ([place])    previous had seen pulmonology --- dr Melvyn Novas, note in epic 03-14-2016, dx COPD GOLD 0   Coronary artery disease    cardiologist--- dr Durward Mallard;  last cardiac cath 01-10-20202 mild nonobstructive disease proxLAD;  per pt, had LHC prior to AVR in [place] 2016   DOE (dyspnea on exertion)    GERD (gastroesophageal reflux disease)    History of 2019 novel coronavirus disease (COVID-19) 12/19/2019   hospital admission w/ covid pneumonia , no intubatiion,  per pt symptoms resolved and back to baseline   History of cerebrovascular accident (CVA) with residual deficit    CVA in 2000 without residuals;  CVA post op AVR 01/ 2016 with residual right hand weakness   History of gastritis    History of pneumothorax    s/p right vats 11-19-2014 and  s/p left vats  12-13-2015   (both spontenaous due to bleb)   Hyperlipidemia    Hypertension    Hyperthyroidism    per pt followed by pcp,  dx approx 2014   Malignant neoplasm of upper-outer quadrant of left breast in female, estrogen receptor positive ([place]) 11/2016   oncologist-- dr Burr Medico--- dx 10/ 2018  ,  s/p left lumptectomy with node dissection;  completed radiation 04-15-2017, no chemo   Multiple thyroid nodules    in care everywhere pt had left thyroid nodule biospy 09-18-2014 benign    NICM (nonischemic cardiomyopathy) ([place]) 2016   2016  ef 30%;  last echo in epic ef 45%   OA (osteoarthritis)    Osteoporosis    Paroxysmal atrial fibrillation [facility])    cardiologist--- dr end   Personal history of  radiation therapy    left breast cancer  03-08-2017  to 04-05-2017   Thrombocytopenia [facility])    followed by dr Burr Medico   Valvular stenosis, aortic 03/12/2014   Status post bioprosthetic AVR  in [place]  for severe AS   Wears dentures    full upper    Past Surgical History:  Procedure Laterality Date   AORTIC VALVE REPLACEMENT  03/12/2014   [facility] [place];  St Jude 53m, medial Trifecta Bioprosthesis   BREAST LUMPECTOMY WITH RADIOACTIVE SEED AND SENTINEL LYMPH NODE BIOPSY Left 01/14/2017   Procedure: LEFT BREAST LUMPECTOMY WITH RADIOACTIVE SEED AND SENTINEL LYMPH NODE BIOPSY;  Surgeon: WRolm Bookbinder MD;  [place] [facility]  Service: General;  Laterality: Left;   CARDIAC CATHETERIZATION  02/12/2014   [facility] [place] 11/17/2020   Procedure: DILATATION AND CURETTAGE /HYSTEROSCOPY WITH MYOSURE;  Surgeon: ANunzio Cobbs MD;  [place]  Service: Gynecology;  Laterality: N/A;   INTRAMEDULLARY (IM) NAIL INTERTROCHANTERIC Left 12/10/2015   Procedure: INTRAMEDULLARY (IM) NAIL INTERTROCHANTRIC;  Surgeon: NLeandrew Koyanagi MD;  [place] [facility]  Service: Orthopedics;  Laterality: Left;   OPERATIVE ULTRASOUND N/A 11/17/2020   Procedure: OPERATIVE ULTRASOUND;  Surgeon: ANunzio Cobbs MD;  [facility]  Service: Gynecology;  Laterality: N/A;   PLEURADESIS Left 12/03/2015   Procedure: PLEURADESIS;  Surgeon: SMelrose Nakayama MD;  [place] [facility]  Service: Thoracic;  Laterality: Left;   RESECTION OF APICAL BLEB Left 12/03/2015   Procedure: BLEBECTOMY;  Surgeon: SMelrose Nakayama MD;  [place] [facility]  Service: Thoracic;  Laterality: Left;   RIGHT/LEFT  HEART CATH AND CORONARY ANGIOGRAPHY N/A 03/10/2020   Procedure: RIGHT/LEFT HEART CATH AND CORONARY ANGIOGRAPHY;  Surgeon: ENelva Bush MD;  [facility];  Service: Cardiovascular;  Laterality: N/A;   VIDEO ASSISTED THORACOSCOPY Left 12/03/2015   Procedure: VIDEO ASSISTED THORACOSCOPY;  Surgeon: SMelrose Nakayama MD;  [facility]  Service: Thoracic;  Laterality: Left;   VIDEO ASSISTED THORACOSCOPY (VATS) W/TALC PLEUADESIS [place]    Allergies  Allergen Reactions   Lisinopril Cough   Tetanus Toxoid Adsorbed Swelling    Arm swelling   Other Cough    PEANUTS   Peanut Allergen Powder-Dnfp Cough    Family History  Problem Relation Age of Onset   Diabetes Mother    Heart attack Mother 633  Diabetes Father    Lung cancer Father    Diabetes Sister    Thyroid disease Sister    Diabetes Sister    HIV Brother    Colon polyps Neg Hx    Colon cancer Neg Hx     Social History   Socioeconomic History   Marital status: Married    Spouse name: Sherwood   Number of children: 0   Years of education: 12   Highest education level: Bachelor's degree (e.g., BA, AB, BS)  Occupational History   Occupation: Retired in 2004  Tobacco Use   Smoking status: Former    Packs/day: 0.25    Years: 10.00    Total pack years: 2.50    Types: Cigarettes    Quit date: 2012    Years since quitting: 12.1   Smokeless tobacco: Never  Vaping Use   Vaping Use: Never used  Substance and Sexual Activity   Alcohol use: No   Drug use:  Never   Sexual activity: Yes    Birth control/protection: Post-menopausal  Other Topics Concern   Not on file  Social History Narrative   Lives with husband.  Ambulated independently.   Right-handed.   No daily caffeine use.   Social Determinants of Health   Financial Resource Strain: Low Risk  (02/01/2022)   Overall Financial Resource Strain (CARDIA)    Difficulty of Paying Living Expenses: Not hard at all   Recent Concern: Financial Resource Strain - Medium Risk (01/25/2022)   Overall Financial Resource Strain (CARDIA)    Difficulty of Paying Living Expenses: Somewhat hard  Food Insecurity: No Food Insecurity (02/01/2022)   Hunger Vital Sign    Worried About Running Out of Food in the Last Year: Never true    Ran Out of Food in the Last Year: Never true  Transportation Needs: No Transportation Needs (02/01/2022)   PRAPARE - Hydrologist (Medical): No    Lack of Transportation (Non-Medical): No  Physical Activity: Inactive (02/01/2022)   Exercise Vital Sign    Days of Exercise per Week: 0 days    Minutes of Exercise per Session: 0 min  Stress: No Stress Concern Present (02/01/2022)   [place]    Feeling of Stress : Not at all  Social Connections: [place] (02/01/2022)   Social Connection and Isolation Panel [NHANES]    Frequency of Communication with Friends and Family: More than three times a week    Frequency of Social Gatherings with Friends and Family: More than three times a week    Attends Religious Services: More than 4 times per year    Active Member of [employer] or Organizations: Yes    Attends Music therapist: More than 4 times per year    Marital Status: Married   Today's Vitals   04/09/22 1016  BP: 128/80  Pulse: 80  Resp: 16  SpO2: 99%  Weight: 222 lb (100.7 kg)  Height: 5' 7"$  (1.702 m)   Body mass index is 34.77 kg/m. Wt Readings from Last 3 Encounters:  04/09/22 222 lb (100.7 kg)  03/04/22 222 lb (100.7 kg)  02/01/22 220 lb (99.8 kg)   Physical Exam Vitals and nursing note reviewed.  Constitutional:      General: She is not in acute distress.    Appearance: She is well-developed.  HENT:     Head: Normocephalic and atraumatic.     Right Ear: Tympanic membrane, ear canal and external ear normal.     Left Ear: Tympanic membrane, ear canal and  external ear normal.     Mouth/Throat:     Mouth: Mucous membranes are moist.     Pharynx: Oropharynx is clear.  Eyes:     Conjunctiva/sclera: Conjunctivae normal.  Cardiovascular:     Rate and Rhythm: Normal rate and regular rhythm.     Pulses:          Dorsalis pedis pulses are 2+ on the right side and 2+ on the left side.     Heart sounds: Murmur (SEM I/VI RUSB) heard.  Pulmonary:     Effort: Pulmonary effort is normal. No respiratory distress.     Breath sounds: Normal breath sounds.  Abdominal:     Palpations: Abdomen is soft.     Tenderness: There is no abdominal tenderness.  Musculoskeletal:     Right lower leg: No edema.     Left lower leg: No edema.  Skin:    General: Skin is warm.     Findings: No erythema or rash.  Neurological:     General: No focal deficit present.     Mental Status: She is alert and oriented to person, place, and time.     Comments: She is in a wheel chair.  Psychiatric:        Mood and Affect: Affect normal. Mood is anxious. Affect is not labile.   ASSESSMENT AND PLAN: Ms. Dafney Recendez was here today annual physical examination.  Orders Placed This Encounter  Procedures   Comprehensive metabolic panel   TSH   T4, free   Lipid panel   VITAMIN D 25 Hydroxy (Vit-D Deficiency, Fractures)   Microalbumin / creatinine urine ratio   Lab Results  Component Value Date   TSH 1.61 04/09/2022   Lab Results  Component Value Date   CHOL 164 04/09/2022   HDL 69.40 04/09/2022   LDLCALC 78 04/09/2022   TRIG 82.0 04/09/2022   CHOLHDL 2 04/09/2022   Lab Results  Component Value Date   CREATININE 1.37 (H) 04/09/2022   BUN 15 04/09/2022   NA 140 04/09/2022   K 4.9 04/09/2022   CL 107 04/09/2022   CO2 23 04/09/2022   Lab Results  Component Value Date   ALT 10 04/09/2022   AST 16 04/09/2022   ALKPHOS 62 04/09/2022   BILITOT 0.6 04/09/2022   Lab Results  Component Value Date   MICROALBUR 0.7 04/09/2022   MICROALBUR 1.0  07/07/2020   Routine general medical examination at a health care facility Assessment & Plan: We discussed the importance of regular physical activity and healthy diet for prevention of chronic illness and/or complications. Preventive guidelines reviewed. Vaccination up to date. Reporting last colonoscopy done 6 years ago, I asked her to sign a release form , so we can obtain copy of report. Ca++ and vit D supplementation to continue. Next CPE in a year.   Chronic HFrEF (heart failure with reduced ejection fraction) (HCC) Assessment & Plan: Stable. Echo 12/2021 LVEF 50 % and grade 1 diastolic dysfunction. She is on Entresto, carvedilol, torsemide, spironolactone. Following with cardiologist.   Depression, major, single episode, moderate ([place]) Assessment & Plan: She does not think she has depression or anxiety, in the past she tried Duloxetine.   Aortic atherosclerosis [facility]) Assessment & Plan: Seen on imaging,abdominal CT 07/2018.  On Rosuvastatin 40 mg daily and chronic anticoagulation.   Paroxysmal atrial fibrillation [facility]) Assessment & Plan: Today rhythm and rate controlled. On carvedilol 25 mg twice daily and Eliquis 5 mg twice daily. Following with cardiologist.    Class 2 severe obesity with serious comorbidity in adult, unspecified BMI, unspecified obesity type ([place]) Assessment & Plan: She understands the benefits of wt loss as well as adverse effects of obesity. Consistency with healthy diet and physical activity as tolerated encouraged. Ozempic helped but d/c because GI side effects.   Stage 3b chronic kidney disease ([place]) Assessment & Plan: Cr and e GFR have been in the 1.3-1.6 and mid 30's respectively. Low-salt diet and avoidance of oral NSAIDs to continue. Adequate hydration and BP control. Numbers are also followed at her cardiologist's office.   Orders: -     Comprehensive metabolic panel; Future -     VITAMIN D 25 Hydroxy (Vit-D Deficiency,  Fractures); Future -     Microalbumin / creatinine urine ratio; Future  Hyperthyroidism Assessment & Plan: Problem has been stable. Continue methimazole 50 mg daily. Further recommendation will  be given according to TSH result.  Orders: -     TSH; Future -     T4, free; Future  Hyperlipidemia LDL goal <70 Assessment & Plan: Continue Rosuvastatin 40 mg daily and low fat diet.  Orders: -     Lipid panel; Future  Abdominal pain, periumbilical Assessment & Plan: Stable. Attributed to [place] but has not resolved after stopping medication 4 weeks ago. Hx and examination do not suggest a serious process. Diarrhea has improved. Monitor for new symptoms. Instructed about warning signs.   Localized osteoporosis without current pathological fracture Assessment & Plan: Prolia co-pay is going to be expensive. She would like to resume Fosamax 70 mg weekly. Discussed some side effects. She is planning on having dental procedure this summer, instructed to let her dentist know about medication. Continue adequate calcium and vitamin D supplementation as well as fall precautions. Due to chronic pain, she is not exercising but she is planning on resuming PT.  Orders: -     Alendronate Sodium; Take 1 tablet (70 mg total) by mouth every 7 (seven) days. Take with a full glass of water on an empty stomach.  Dispense: 13 tablet; Refill: 3  Gastroesophageal reflux disease, unspecified whether esophagitis present Assessment & Plan: She is not taking Protonix. Recommend omeprazole 40 mg daily 30 minutes before breakfast for 6 to 8 weeks then she can try 20 mg daily. GERD precautions are recommended.  Orders: -     Omeprazole; Take 1 capsule (40 mg total) by mouth daily.  Dispense: 60 capsule; Refill: 0  Chronic obstructive pulmonary disease, unspecified COPD type ([place]) Assessment & Plan: She has not been symptomatic. Currently she is not on treatment (albuterol and Symbicort). F/U as  needed.   Thrombocytopenia ([place]) Assessment & Plan: Problem has been stable. Following with oncologist/hematologist.   Return in 1 year (on 04/10/2023) for Needs release form to obtain colonoscopy., CPE.  Lupita Rosales G. [name], MD  [facility]. [place] office.

## 2022-04-07 NOTE — Addendum Note (Signed)
Addended by: [name] E on: 04/07/2022 12:00 PM   Modules accepted: Orders

## 2022-04-09 ENCOUNTER — Encounter: Payer: Self-pay | Admitting: Family Medicine

## 2022-04-09 ENCOUNTER — Ambulatory Visit (INDEPENDENT_AMBULATORY_CARE_PROVIDER_SITE_OTHER): Payer: Medicare PPO | Admitting: Family Medicine

## 2022-04-09 VITALS — BP 128/80 | HR 80 | Resp 16 | Ht 67.0 in | Wt 222.0 lb

## 2022-04-09 DIAGNOSIS — Z0000 Encounter for general adult medical examination without abnormal findings: Secondary | ICD-10-CM | POA: Diagnosis not present

## 2022-04-09 DIAGNOSIS — I480 Paroxysmal atrial fibrillation: Secondary | ICD-10-CM

## 2022-04-09 DIAGNOSIS — N1832 Chronic kidney disease, stage 3b: Secondary | ICD-10-CM | POA: Diagnosis not present

## 2022-04-09 DIAGNOSIS — J449 Chronic obstructive pulmonary disease, unspecified: Secondary | ICD-10-CM

## 2022-04-09 DIAGNOSIS — R1033 Periumbilical pain: Secondary | ICD-10-CM

## 2022-04-09 DIAGNOSIS — E0590 Thyrotoxicosis, unspecified without thyrotoxic crisis or storm: Secondary | ICD-10-CM | POA: Diagnosis not present

## 2022-04-09 DIAGNOSIS — K219 Gastro-esophageal reflux disease without esophagitis: Secondary | ICD-10-CM

## 2022-04-09 DIAGNOSIS — I700 Atherosclerosis of aorta: Secondary | ICD-10-CM

## 2022-04-09 DIAGNOSIS — I5022 Chronic systolic (congestive) heart failure: Secondary | ICD-10-CM | POA: Diagnosis not present

## 2022-04-09 DIAGNOSIS — E785 Hyperlipidemia, unspecified: Secondary | ICD-10-CM | POA: Diagnosis not present

## 2022-04-09 DIAGNOSIS — F321 Major depressive disorder, single episode, moderate: Secondary | ICD-10-CM | POA: Diagnosis not present

## 2022-04-09 DIAGNOSIS — D696 Thrombocytopenia, unspecified: Secondary | ICD-10-CM

## 2022-04-09 DIAGNOSIS — M816 Localized osteoporosis [Lequesne]: Secondary | ICD-10-CM

## 2022-04-09 LAB — LIPID PANEL
Cholesterol: 164 mg/dL (ref 0–200)
HDL: 69.4 mg/dL (ref >39.00)
LDL Cholesterol: 78 mg/dL (ref 0–99)
NonHDL: 94.18
Total CHOL/HDL Ratio: 2
Triglycerides: 82 mg/dL (ref 0.0–149.0)
VLDL: 16.4 mg/dL (ref 0.0–40.0)

## 2022-04-09 LAB — TSH: TSH: 1.61 u[IU]/mL (ref 0.35–5.50)

## 2022-04-09 LAB — COMPREHENSIVE METABOLIC PANEL
ALT: 10 U/L (ref 0–35)
AST: 16 U/L (ref 0–37)
Albumin: 4.3 g/dL (ref 3.5–5.2)
Alkaline Phosphatase: 62 U/L (ref 39–117)
BUN: 15 mg/dL (ref 6–23)
CO2: 23 mEq/L (ref 19–32)
Calcium: 10.3 mg/dL (ref 8.4–10.5)
Chloride: 107 mEq/L (ref 96–112)
Creatinine, Ser: 1.37 mg/dL — ABNORMAL HIGH (ref 0.40–1.20)
GFR: 37.58 mL/min — ABNORMAL LOW (ref >60.00)
Glucose, Bld: 80 mg/dL (ref 70–99)
Potassium: 4.9 mEq/L (ref 3.5–5.1)
Sodium: 140 mEq/L (ref 135–145)
Total Bilirubin: 0.6 mg/dL (ref 0.2–1.2)
Total Protein: 7.4 g/dL (ref 6.0–8.3)

## 2022-04-09 LAB — MICROALBUMIN / CREATININE URINE RATIO
Creatinine,U: 98.8 mg/dL
Microalb Creat Ratio: 0.7 mg/g (ref 0.0–30.0)
Microalb, Ur: 0.7 mg/dL (ref 0.0–1.9)

## 2022-04-09 LAB — T4, FREE: Free T4: 0.77 ng/dL (ref 0.60–1.60)

## 2022-04-09 LAB — VITAMIN D 25 HYDROXY (VIT D DEFICIENCY, FRACTURES): VITD: 56.49 ng/mL (ref 30.00–100.00)

## 2022-04-09 MED ORDER — ALENDRONATE SODIUM 70 MG PO TABS: 70.0000 mg | ORAL_TABLET | ORAL | 3 refills | Status: DC

## 2022-04-09 MED ORDER — OMEPRAZOLE 40 MG PO CPDR: 40.0000 mg | DELAYED_RELEASE_CAPSULE | Freq: Every day | ORAL | 0 refills | Status: DC

## 2022-04-09 NOTE — Patient Instructions (Addendum)
A few things to remember from today's visit:  Hyperthyroidism - Plan: TSH, T4, free  Chronic obstructive pulmonary disease, unspecified COPD type ([place]), Chronic  Chronic HFrEF (heart failure with reduced ejection fraction) (HCC), Chronic  Depression, major, single episode, moderate (HCC), Chronic  Aortic atherosclerosis (HCC), Chronic  Paroxysmal atrial fibrillation (HCC), Chronic  Class 2 severe obesity with serious comorbidity in adult, unspecified BMI, unspecified obesity type ([place]), Chronic  Stage 3b chronic kidney disease ([place]), Chronic - Plan: Comprehensive metabolic panel, VITAMIN D 25 Hydroxy (Vit-D Deficiency, Fractures), Microalbumin / creatinine urine ratio  Hyperlipidemia LDL goal <70 - Plan: Lipid panel  Routine general medical examination at a health care facility  For osteoporosis Prolia is a good options but can be expensive. Complete dental work before starting new medications. Fall precautions. If labs are stable, I can see you annually given the fact you see other providers. We need a copy of your last colonoscopy. If abdominal pain doe snot resolve, may need gastro evaluation.  If you need refills for medications you take chronically, please call your pharmacy. Do not use My Chart to request refills or for acute issues that need immediate attention. If you send a my chart message, it may take a few days to be addressed, specially if I am not in the office.  Please be sure medication list is accurate. If a new problem present, please set up appointment sooner than planned today.

## 2022-04-10 NOTE — Assessment & Plan Note (Signed)
Stable. Echo 12/2021 LVEF 50 % and grade 1 diastolic dysfunction. She is on Entresto, carvedilol, torsemide, spironolactone. Following with cardiologist.

## 2022-04-10 NOTE — Assessment & Plan Note (Signed)
Continue Rosuvastatin 40 mg daily and low fat diet.

## 2022-04-10 NOTE — Assessment & Plan Note (Signed)
Cr and e GFR have been in the 1.3-1.6 and mid 30's respectively. Low-salt diet and avoidance of oral NSAIDs to continue. Adequate hydration and BP control. Numbers are also followed at her cardiologist's office.

## 2022-04-10 NOTE — Assessment & Plan Note (Signed)
Problem has been stable. Continue methimazole 50 mg daily. Further recommendation will be given according to TSH result.

## 2022-04-10 NOTE — Assessment & Plan Note (Signed)
Today rhythm and rate controlled. On carvedilol 25 mg twice daily and Eliquis 5 mg twice daily. Following with cardiologist.

## 2022-04-10 NOTE — Assessment & Plan Note (Signed)
[place] co-pay is going to be expensive. She would like to resume Fosamax 70 mg weekly. Discussed some side effects. She is planning on having dental procedure this summer, instructed to let her dentist know about medication. Continue adequate calcium and vitamin D supplementation as well as fall precautions. Due to chronic pain, she is not exercising but she is planning on resuming PT.

## 2022-04-10 NOTE — Assessment & Plan Note (Addendum)
She does not think she has depression or anxiety, in the past she tried Duloxetine.

## 2022-04-10 NOTE — Assessment & Plan Note (Signed)
Seen on imaging,abdominal CT 07/2018.  On Rosuvastatin 40 mg daily and chronic anticoagulation.

## 2022-04-10 NOTE — Assessment & Plan Note (Signed)
She has not been symptomatic. Currently she is not on treatment (albuterol and Symbicort). F/U as needed.

## 2022-04-10 NOTE — Assessment & Plan Note (Signed)
Problem has been stable. Following with oncologist/hematologist.

## 2022-04-10 NOTE — Assessment & Plan Note (Signed)
Stable. Attributed to [place] but has not resolved after stopping medication 4 weeks ago. Hx and examination do not suggest a serious process. Diarrhea has improved. Monitor for new symptoms. Instructed about warning signs.

## 2022-04-10 NOTE — Assessment & Plan Note (Signed)
She understands the benefits of wt loss as well as adverse effects of obesity. Consistency with healthy diet and physical activity as tolerated encouraged. Ozempic helped but d/c because GI side effects.

## 2022-04-10 NOTE — Assessment & Plan Note (Signed)
We discussed the importance of regular physical activity and healthy diet for prevention of chronic illness and/or complications. Preventive guidelines reviewed. Vaccination up to date. Reporting last colonoscopy done 6 years ago, I asked her to sign a release form , so we can obtain copy of report. Ca++ and vit D supplementation to continue. Next CPE in a year.

## 2022-04-10 NOTE — Assessment & Plan Note (Signed)
She is not taking Protonix. Recommend omeprazole 40 mg daily 30 minutes before breakfast for 6 to 8 weeks then she can try 20 mg daily. GERD precautions are recommended.

## 2022-04-13 ENCOUNTER — Telehealth: Payer: Self-pay

## 2022-04-13 NOTE — Telephone Encounter (Signed)
  Care Management & Coordination Services Pharmacy Note  04/13/2022 Name:  Danielle [name] MRN:  [id number] DOB:  07/09/1945  Summary: Pt received Omeprazole and Alendronate in the mail today and was confused on how to take both medications. Reviewed directions and administration timing. Patient expressed understanding.  [place] Pharmacist [phone]

## 2022-04-14 ENCOUNTER — Telehealth: Payer: Self-pay

## 2022-04-14 NOTE — Progress Notes (Signed)
Care Management & Coordination Services Pharmacy Team  Reason for Encounter: Follow up pap for Entresto  Spoke with [place] at [employer], patient has been approved to receive Entresto through the patient assistance program, approval is good through 03/01/2023.  Patients first shipment will go out tomorrow 04/15/2022 and will arrive at the patients home on 04/19/2022. Patient notified.   [place] Pharmacist Assistant [phone]

## 2022-04-21 ENCOUNTER — Ambulatory Visit: Payer: Medicare PPO | Admitting: Physical Therapy

## 2022-05-04 ENCOUNTER — Telehealth: Payer: Self-pay

## 2022-05-04 NOTE — Telephone Encounter (Signed)
Patient called requesting HHPT because she nor her husband can drive. She is unable to get to Outpt PT

## 2022-05-14 ENCOUNTER — Telehealth: Payer: Self-pay | Admitting: Pharmacist

## 2022-05-14 ENCOUNTER — Telehealth (HOSPITAL_COMMUNITY): Payer: Self-pay

## 2022-05-14 ENCOUNTER — Telehealth: Payer: Self-pay | Admitting: Internal Medicine

## 2022-05-14 MED ORDER — APIXABAN 5 MG PO TABS: 5.0000 mg | ORAL_TABLET | Freq: Two times a day (BID) | ORAL | 0 refills | Status: DC

## 2022-05-14 MED ORDER — APIXABAN 5 MG PO TABS: 5.0000 mg | ORAL_TABLET | Freq: Two times a day (BID) | ORAL | 11 refills | Status: DC

## 2022-05-14 NOTE — Telephone Encounter (Signed)
Refills have been sent into her pharmacy. She also has samples here in our office but she went to the [facility] office instead. They called to inquire about this and will assist patient from here. We did get her scheduled with appointment needed for her follow up.

## 2022-05-14 NOTE — Telephone Encounter (Signed)
Received call from scheduling. Patient was assigned to Dr End but has not seen him before. Is followed by Dr Aundra Dubin at [facility]. Needs Eliquis samples and can not make it to [facility]. Advised if she can make it to [facility] they can provide samples.

## 2022-05-14 NOTE — Telephone Encounter (Signed)
*  STAT* If patient is at the pharmacy, call can be transferred to refill team.   1. Which medications need to be refilled? (please list name of each medication and dose if known)   apixaban (ELIQUIS) 5 MG TABS tablet    2. Which pharmacy/location (including street and city if local pharmacy) is medication to be sent to? [place] ([place]), [place] - [address]    3. Do they need a 30 day or 90 day supply?  90 day supply  Patient is following up stating that she has no way to get to the office. She states she is completely out of medication and she is requesting to have her refill sent to the pharmacy.

## 2022-05-14 NOTE — Addendum Note (Signed)
Addended by: Valora Corporal on: 05/14/2022 02:58 PM   Modules accepted: Orders

## 2022-05-14 NOTE — Telephone Encounter (Signed)
Patient calling the office for samples of medication:   1.  What medication and dosage are you requesting samples for? Eliquis   2.  Are you currently out of this medication? No, will be out of this medication tomorrow. Needs samples picked up today. She states that pharmacy says it's too soon to pick up new prescription.

## 2022-05-14 NOTE — Telephone Encounter (Signed)
Spoke with patient and reviewed that I do have some samples ready for her to pick up but that we also need to schedule her a follow up visit. Appointment scheduled and samples are available for her to pick up. Spoke with pharmacist who previously assisted with [facility] assistance and inquired if she could assist with Eliquis assistance. No further needs at this time.

## 2022-05-14 NOTE — Telephone Encounter (Signed)
Patient needs assistance with cost of Eliquis. She is very confused. She had set up samples from [facility] office for cardiology - but cannot get there.

## 2022-05-19 ENCOUNTER — Ambulatory Visit: Payer: Medicare PPO

## 2022-05-20 ENCOUNTER — Inpatient Hospital Stay: Payer: Medicare HMO | Attending: Hematology

## 2022-05-20 ENCOUNTER — Inpatient Hospital Stay: Payer: Medicare HMO | Admitting: Hematology

## 2022-05-20 DIAGNOSIS — Z853 Personal history of malignant neoplasm of breast: Secondary | ICD-10-CM | POA: Diagnosis not present

## 2022-05-20 DIAGNOSIS — D696 Thrombocytopenia, unspecified: Secondary | ICD-10-CM | POA: Diagnosis not present

## 2022-05-20 DIAGNOSIS — Z170 Estrogen receptor positive status [ER+]: Secondary | ICD-10-CM

## 2022-05-20 LAB — CMP (CANCER CENTER ONLY)
ALT: 11 U/L (ref 0–44)
AST: 15 U/L (ref 15–41)
Albumin: 4.2 g/dL (ref 3.5–5.0)
Alkaline Phosphatase: 68 U/L (ref 38–126)
Anion gap: 5 (ref 5–15)
BUN: 19 mg/dL (ref 8–23)
CO2: 27 mmol/L (ref 22–32)
Calcium: 10.5 mg/dL — ABNORMAL HIGH (ref 8.9–10.3)
Chloride: 108 mmol/L (ref 98–111)
Creatinine: 1.46 mg/dL — ABNORMAL HIGH (ref 0.44–1.00)
GFR, Estimated: 37 mL/min — ABNORMAL LOW (ref >60)
Glucose, Bld: 102 mg/dL — ABNORMAL HIGH (ref 70–99)
Potassium: 4.9 mmol/L (ref 3.5–5.1)
Sodium: 140 mmol/L (ref 135–145)
Total Bilirubin: 0.6 mg/dL (ref 0.3–1.2)
Total Protein: 7.3 g/dL (ref 6.5–8.1)

## 2022-05-20 LAB — CBC WITH DIFFERENTIAL (CANCER CENTER ONLY)
Abs Immature Granulocytes: 0.02 10*3/uL (ref 0.00–0.07)
Basophils Absolute: 0 10*3/uL (ref 0.0–0.1)
Basophils Relative: 1 %
Eosinophils Absolute: 0.4 10*3/uL (ref 0.0–0.5)
Eosinophils Relative: 8 %
HCT: 39.8 % (ref 36.0–46.0)
Hemoglobin: 13 g/dL (ref 12.0–15.0)
Immature Granulocytes: 0 %
Lymphocytes Relative: 23 %
Lymphs Abs: 1.1 10*3/uL (ref 0.7–4.0)
MCH: 31.3 pg (ref 26.0–34.0)
MCHC: 32.7 g/dL (ref 30.0–36.0)
MCV: 95.9 fL (ref 80.0–100.0)
Monocytes Absolute: 0.4 10*3/uL (ref 0.1–1.0)
Monocytes Relative: 9 %
Neutro Abs: 2.9 10*3/uL (ref 1.7–7.7)
Neutrophils Relative %: 59 %
Platelet Count: 50 10*3/uL — ABNORMAL LOW (ref 150–400)
RBC: 4.15 MIL/uL (ref 3.87–5.11)
RDW: 13.1 % (ref 11.5–15.5)
WBC Count: 5 10*3/uL (ref 4.0–10.5)
nRBC: 0 % (ref 0.0–0.2)

## 2022-05-21 ENCOUNTER — Telehealth: Payer: Self-pay

## 2022-05-21 NOTE — Progress Notes (Unsigned)
Care Management & Coordination Services Pharmacy Team  Reason for Encounter: Hypertension  Contacted patient to discuss hypertension disease state. {US HC Outreach:28874}    Current antihypertensive regimen:  Carvedilol 25 mg twice daily Entresto 49/51 mg twice daily Spironolactone 25 mg 1/2 daily Patient verbally confirms she is taking the above medications as directed. {yes/no:20286}  How often are you checking your Blood Pressure? {CHL HP BP Monitoring Frequency:[phone]}  she checks her blood pressure {timing:25218} {before/after:25217} taking her medication.  Current home BP readings:  DATE:             BP               PULSE  Wrist or arm cuff:  OTC medications including pseudoephedrine or NSAIDs?  Any readings above 180/100? {yes/no:20286} If yes any symptoms of hypertensive emergency? {hypertensive emergency symptoms:25354}  What recent interventions/DTPs have been made by any provider to improve Blood Pressure control since last CPP Visit: No recent interventions  Any recent hospitalizations or ED visits since last visit with CPP? No recent hospital visits.   What diet changes have been made to improve Blood Pressure Control?  Patient follows no specific diet Breakfast - patient will have cereal Lunch / Dinner - patient will have a meat with vegetables Snack - patient will have crackers or popcorn Caffeine intake -  Salt intake -   What exercise is being done to improve your Blood Pressure Control?  Patient will walk as much as she can   Adherence Review: Is the patient currently on ACE/ARB medication? No Does the patient have >5 day gap between last estimated fill dates? No  Care Gaps: AWV - scheduled 02/04/2023 Next appt - 07/19/2022 Last BP - 128/80 on 04/09/2022 Covid - overdue Flu - postponed   Star Rating Drug: Rosuvastatin 40 mg - last filled 01/13/2022 90 DS at [facility] verified with pharm tech  Chart Updates: Recent office visits:   04/09/2022 [name] - Patient was seen for Routine general medical examination at a health care facility and additional concerns. Started Omeprazole 40 mg daily. Discontinued Symbicort, Pantoprazole and Zolpidem.   02/01/2022 Rolene Arbour LPN - Medicare annual wellness exam.  Recent consult visits:  03/04/2022 [name] MD (PT and Rehab) - Patient was seen for primary osteoarthritis of left knee and additional concerns. No medication changes.   Hospital visits:  None  Medications: Outpatient Encounter Medications as of 05/21/2022  Medication Sig   albuterol (PROVENTIL HFA;VENTOLIN HFA) 108 (90 Base) MCG/ACT inhaler Inhale 2 puffs into the lungs every 6 (six) hours as needed for wheezing or shortness of breath.   alendronate (FOSAMAX) 70 MG tablet Take 1 tablet (70 mg total) by mouth every 7 (seven) days. Take with a full glass of water on an empty stomach.   apixaban (ELIQUIS) 5 MG TABS tablet Take 1 tablet (5 mg total) by mouth 2 (two) times daily.   apixaban (ELIQUIS) 5 MG TABS tablet Take 1 tablet (5 mg total) by mouth 2 (two) times daily.   ascorbic acid (VITAMIN C) 500 MG tablet Take 500 mg by mouth daily.   carvedilol (COREG) 25 MG tablet Take 1 tablet (25 mg total) by mouth 2 (two) times daily with a meal.   Cholecalciferol 25 MCG (1000 UT) tablet Take 1,000 Units by mouth daily.   cyclobenzaprine (FLEXERIL) 5 MG tablet Take 1 tablet (5 mg total) by mouth 2 (two) times daily as needed for muscle spasms.   fluticasone (FLONASE) 50 MCG/ACT nasal  spray Place 1 spray into both nostrils daily.   lidocaine (LIDODERM) 5 % Place 1 patch onto the skin daily. Remove & Discard patch within 12 hours or as directed by MD   methimazole (TAPAZOLE) 5 MG tablet Take 3 tablets (15 mg total) by mouth daily.   Multiple Vitamin (MULTIVITAMIN) tablet Take 1 tablet by mouth daily.   omeprazole (PRILOSEC) 40 MG capsule Take 1 capsule (40 mg total) by mouth daily.   rosuvastatin (CRESTOR) 40  MG tablet Take 1 tablet (40 mg total) by mouth daily.   sacubitril-valsartan (ENTRESTO) 49-51 MG Take 1 tablet by mouth 2 (two) times daily.   senna-docusate (SENOKOT-S) 8.6-50 MG tablet Take 2 tablets by mouth at bedtime.   spironolactone (ALDACTONE) 25 MG tablet Take 0.5 tablets (12.5 mg total) by mouth daily.   torsemide (DEMADEX) 20 MG tablet Take 1 tablet (20 mg total) by mouth as needed (weight gain / swelling).   traMADol (ULTRAM) 50 MG tablet TAKE 1 TABLET BY MOUTH THREE TIMES DAILY   trolamine salicylate (ASPERCREME) 10 % cream Apply 1 application topically as needed for muscle pain.   valACYclovir (VALTREX) 500 MG tablet Take on tablet by mouth twice a day for 3 days as needed for an outbreak   No facility-administered encounter medications on file as of 05/21/2022.  Fill History:   Dispensed Days Supply Quantity Provider Pharmacy  ALENDRONATE 70MG     TAB 04/09/2022 84 12 each      Dispensed Days Supply Quantity Provider Pharmacy  ELIQUIS 5MG          TAB 05/15/2022 30 60 each      Dispensed Days Supply Quantity Provider Pharmacy  CYCLOBENZAPR 5MG     TAB 12/29/2021 10 20 each      Dispensed Days Supply Quantity Provider Pharmacy  methimazole 5 mg tablet 01/13/2022 90 270 tablet      Dispensed Days Supply Quantity Provider Pharmacy  OMEPRAZOLE DR 40MG   CAP 04/09/2022 60 60 each      Dispensed Days Supply Quantity Provider Pharmacy  rosuvastatin 40 mg tablet 01/13/2022 90 90 tablet      Dispensed Days Supply Quantity Provider Pharmacy  Entresto 49 mg-51 mg tablet 04/15/2021 30 60 tablet      Dispensed Days Supply Quantity Provider Pharmacy  spironolactone 25 mg tablet 01/13/2022 90 90 tablet      Dispensed Days Supply Quantity Provider Pharmacy  torsemide 20 mg tablet 10/07/2021 90 225 tablet      Dispensed Days Supply Quantity Provider Pharmacy  TRAMADOL 50MG  TAB 04/06/2022 30 90 each     Recent Office Vitals: BP Readings from Last 3 Encounters:  04/09/22 128/80   03/04/22 (!) 150/88  01/07/22 130/80   Pulse Readings from Last 3 Encounters:  04/09/22 80  03/04/22 99  01/07/22 76    Wt Readings from Last 3 Encounters:  04/09/22 222 lb (100.7 kg)  03/04/22 222 lb (100.7 kg)  02/01/22 220 lb (99.8 kg)     Kidney Function Lab Results  Component Value Date/Time   CREATININE 1.46 (H) 05/20/2022 09:19 AM   CREATININE 1.37 (H) 04/09/2022 11:46 AM   CREATININE 1.39 (H) 02/10/2022 10:03 AM   CREATININE 1.67 (H) 11/19/2021 09:37 AM   CREATININE 1.09 (H) 12/31/2019 11:55 AM   CREATININE 1.2 (H) 12/22/2016 08:50 AM   GFR 37.58 (L) 04/09/2022 11:46 AM   GFRNONAA 37 (L) 05/20/2022 09:19 AM   GFRNONAA 50 (L) 12/31/2019 11:55 AM   GFRAA 57 (L) 02/27/2020 09:24 AM  GFRAA 58 (L) 12/31/2019 11:55 AM       Latest Ref Rng & Units 05/20/2022    9:19 AM 04/09/2022   11:46 AM 02/10/2022   10:03 AM  BMP  Glucose 70 - 99 mg/dL 102  80  102   BUN 8 - 23 mg/dL 19  15  10    Creatinine 0.44 - 1.00 mg/dL 1.46  1.37  1.39   Sodium 135 - 145 mmol/L 140  140  139   Potassium 3.5 - 5.1 mmol/L 4.9  4.9  4.8   Chloride 98 - 111 mmol/L 108  107  109   CO2 22 - 32 mmol/L 27  23  23    Calcium 8.9 - 10.3 mg/dL 10.5  10.3  9.9    [place] Pharmacist Assistant [phone]

## 2022-05-31 ENCOUNTER — Telehealth: Payer: Self-pay | Admitting: Internal Medicine

## 2022-05-31 NOTE — Telephone Encounter (Signed)
Marita [place], Pharmacy Tech with [facility], called to say she was going to be faxing over a form that needs to be completed with the patient diagnosis and sent back to them.  It's for a program that the patient has qualified to receive Eliquis for free.  They will also be sending the prescription for Eliquis 5mg  over to [facility] mail order pharmacy.

## 2022-05-31 NOTE — Telephone Encounter (Signed)
Noted-message sent to the nurse

## 2022-06-01 ENCOUNTER — Other Ambulatory Visit: Payer: Self-pay

## 2022-06-01 DIAGNOSIS — I480 Paroxysmal atrial fibrillation: Secondary | ICD-10-CM

## 2022-06-01 MED ORDER — APIXABAN 5 MG PO TABS: 5.0000 mg | ORAL_TABLET | Freq: Two times a day (BID) | ORAL | 1 refills | Status: DC

## 2022-06-01 NOTE — Telephone Encounter (Signed)
Prescription refill request for Eliquis received. Indication: Afib  Last office visit: 01/07/22 Danielle [name])  Scr: 1.46 (05/20/22)  Age: 77 Weight: 100.7kg  Appropriate dose. Refill sent.

## 2022-06-02 ENCOUNTER — Telehealth: Payer: Self-pay | Admitting: *Deleted

## 2022-06-02 NOTE — Telephone Encounter (Signed)
   Telephone encounter was:  Unsuccessful.  06/02/2022 Name: Danielle [name] MRN: [id number] DOB: October 17, 1945  Unsuccessful outbound call made today to assist with:  Transportation Needs   Outreach Attempt:  1st Attempt mailbox is full    [place] [address] , [place] [address] Email : [name]

## 2022-06-03 ENCOUNTER — Telehealth: Payer: Self-pay | Admitting: *Deleted

## 2022-06-03 ENCOUNTER — Other Ambulatory Visit: Payer: Self-pay | Admitting: *Deleted

## 2022-06-03 DIAGNOSIS — I480 Paroxysmal atrial fibrillation: Secondary | ICD-10-CM

## 2022-06-03 MED ORDER — APIXABAN 5 MG PO TABS: 5.0000 mg | ORAL_TABLET | Freq: Two times a day (BID) | ORAL | 1 refills | Status: DC

## 2022-06-03 NOTE — Telephone Encounter (Signed)
Telephone encounter was:  Successful.  06/03/2022 Name: Danielle [name] MRN: [id number] DOB: [birth year]  Danielle [name] is a 77 y.o. year old female who is a primary care patient of [name], [name], MD . The community resource team was consulted for assistance with Transportation Needs  Booked ride through [facility] guide performed the following interventions: Patient provided with information about care guide support team and interviewed to confirm resource needs.  Follow Up Plan:  No further follow up planned at this time. The patient has been provided with needed resources.  Danielle [name] [phone] [address] , [place] [address] Email : Danielle [name]. Danielle [name] [email or web address] .[name] DOB: [birth year] MRN: [id number]   RIDER WAIVER AND RELEASE OF LIABILITY  For purposes of improving physical access to our facilities, [facility] is pleased to partner with third parties to provide Danielle [name] patients or other authorized individuals the option of convenient, on-demand ground transportation services (the Danielle [name]") through use of the technology service that enables users to request on-demand ground transportation from independent third-party providers.  By opting to use and accept these [employer], I, the undersigned, hereby agree on behalf of myself, and on behalf of any minor child using the Government social research officer for whom I am the parent or legal guardian, as follows:  Government social research officer provided to me are provided by independent third-party transportation providers who are not [employer] or employees and who are unaffiliated with [employer]. [place] is neither a transportation carrier nor a common or public carrier. [facility] has no control over the quality or safety of the transportation that occurs as a result of the [employer]. Danielle [name] cannot guarantee that any third-party transportation provider will complete any arranged transportation service. Danielle [name] makes no representation, warranty, or guarantee regarding the reliability, timeliness, quality, safety, suitability, or availability of any of the Transport Services or that they will be error free. I fully understand that traveling by vehicle involves risks and dangers of serious bodily injury, including permanent disability, paralysis, and death. I agree, on behalf of myself and on behalf of any minor child using the Transport Services for whom I am the parent or legal guardian, that the entire risk arising out of my use of the [employer] remains solely with me, to the maximum extent permitted under applicable law. The [employer] are provided "as is" and "as available." Danielle [name] disclaims all representations and warranties, express, implied or statutory, not expressly set out in these terms, including the implied warranties of merchantability and fitness for a particular purpose. I hereby waive and release Danielle [name], its agents, employees, officers, directors, representatives, insurers, attorneys, assigns, successors, subsidiaries, and affiliates from any and all past, present, or future claims, demands, liabilities, actions, causes of action, or suits of any kind directly or indirectly arising from acceptance and use of the [employer]. I further waive and release Danielle [name] and its affiliates from all present and future liability and responsibility for any injury or death to persons or damages to property caused by or related to the use of the [employer]. I have read this Waiver and Release of Liability, and I understand the terms used in it and their legal significance. This Waiver is freely and voluntarily given with the understanding that my right (as well as the right of any minor child for whom I am the parent or legal  guardian using the [employer]) to legal recourse against Danielle [name] in connection with the [employer] is knowingly surrendered in return for use of these services.   I attest that I read the consent document to Danielle [name], gave Danielle [name] the opportunity to ask questions and answered the questions asked (if any). I affirm that Danielle [name] then provided consent for she's participation in this program.     Danielle [name]

## 2022-06-03 NOTE — Telephone Encounter (Signed)
[facility] received a prescription refill for Eliquis 5mg  from [facility]. The refill has been processed as requested.

## 2022-06-03 NOTE — Addendum Note (Signed)
Addended by: Leonidas Romberg on: 06/03/2022 02:29 PM   Modules accepted: Orders

## 2022-06-03 NOTE — Telephone Encounter (Signed)
Eliquis 5mg  refill request received from [facility]. Patient is 77 years old, weight-100.7kg, Crea-1.46 on 05/20/22, Diagnosis-Afib, and last seen by Dr. Aundra Dubin on 01/07/22 and has an appt with Cadence Furth on 06/14/22. Dose is appropriate based on dosing criteria. Will send in refill to requested pharmacy.

## 2022-06-03 NOTE — Telephone Encounter (Signed)
Refill was accidentally documented as "Sample" in this encounter on 06/01/22. Correct refill sent to pharmacy by [facility], RN. Please disregard sample.

## 2022-06-04 ENCOUNTER — Encounter: Payer: Medicare HMO | Attending: Physical Medicine & Rehabilitation | Admitting: Physical Medicine & Rehabilitation

## 2022-06-04 ENCOUNTER — Encounter: Payer: Self-pay | Admitting: Physical Medicine & Rehabilitation

## 2022-06-04 VITALS — BP 121/82 | HR 81 | Ht 67.0 in | Wt 235.0 lb

## 2022-06-04 DIAGNOSIS — M1711 Unilateral primary osteoarthritis, right knee: Secondary | ICD-10-CM | POA: Insufficient documentation

## 2022-06-04 MED ORDER — HYLAN G-F 20 16 MG/2ML IX SOSY
16.0000 mg | PREFILLED_SYRINGE | Freq: Once | INTRA_ARTICULAR | Status: AC
  Administered 2022-06-04: 16 mg via INTRA_ARTICULAR

## 2022-06-04 NOTE — Progress Notes (Signed)
Indication end-stage osteoarthritis of the RIGHT knee with pain that limits mobility and does not respond to oral medications.  Ultrasound guidance, 15 Hz linear transducer, long axis view  Medial aspect of the knee was imaged, identified joint space, identified patella, femur, tibia. 25-gauge 1.5 inch needle was inserted under ultrasound guidance and 3 mL of 1% lidocaine were infiltrated into the skin and subcutaneous tissue. Then a 21-gauge, 2 inch needle was inserted along the same needle track Into the joint under direct ultrasound visualization.  6 mL of Synvisc-1 were injected. Patient tolerated procedure well Post procedure instructions given

## 2022-06-04 NOTE — Addendum Note (Signed)
Addended by: Erick Colace on: 06/04/2022 10:11 AM   Modules accepted: Orders

## 2022-06-04 NOTE — Patient Instructions (Signed)

## 2022-06-14 ENCOUNTER — Ambulatory Visit: Payer: Medicare HMO | Admitting: Medical

## 2022-06-18 ENCOUNTER — Telehealth: Payer: Self-pay | Admitting: *Deleted

## 2022-06-18 NOTE — Telephone Encounter (Signed)
   Laurian Pulliam-McEachean DOB: 04/05/1945 MRN: [id number]   RIDER WAIVER AND RELEASE OF LIABILITY  For purposes of improving physical access to our facilities, [facility] is pleased to partner with third parties to provide [facility] patients or other authorized individuals the option of convenient, on-demand ground transportation services (the "Transport Services") through use of the technology service that enables users to request on-demand ground transportation from independent third-party providers.  By opting to use and accept these Transport Services, I, the undersigned, hereby agree on behalf of myself, and on behalf of any minor child using the Transport Services for whom I am the parent or legal guardian, as follows:  Transport Services provided to me are provided by independent third-party transportation providers who are not [employer] agents or employees and who are unaffiliated with [place]. [place] is neither a transportation carrier nor a common or public carrier. [place] has no control over the quality or safety of the transportation that occurs as a result of the Transport Services. [place] cannot guarantee that any third-party transportation provider will complete any arranged transportation service. [place] makes no representation, warranty, or guarantee regarding the reliability, timeliness, quality, safety, suitability, or availability of any of the Transport Services or that they will be error free. I fully understand that traveling by vehicle involves risks and dangers of serious bodily injury, including permanent disability, paralysis, and death. I agree, on behalf of myself and on behalf of any minor child using the Transport Services for whom I am the parent or legal guardian, that the entire risk arising out of my use of the Transport Services remains solely with me, to the maximum extent permitted under applicable law. The Transport Services are  provided "as is" and "as available." [place] disclaims all representations and warranties, express, implied or statutory, not expressly set out in these terms, including the implied warranties of merchantability and fitness for a particular purpose. I hereby waive and release [facility], its agents, employees, officers, directors, representatives, insurers, attorneys, assigns, successors, subsidiaries, and affiliates from any and all past, present, or future claims, demands, liabilities, actions, causes of action, or suits of any kind directly or indirectly arising from acceptance and use of the Transport Services. I further waive and release [place] and its affiliates from all present and future liability and responsibility for any injury or death to persons or damages to property caused by or related to the use of the Transport Services. I have read this Waiver and Release of Liability, and I understand the terms used in it and their legal significance. This Waiver is freely and voluntarily given with the understanding that my right (as well as the right of any minor child for whom I am the parent or legal guardian using the Transport Services) to legal recourse against [facility] in connection with the Transport Services is knowingly surrendered in return for use of these services.   I attest that I read the consent document to Afreen Pulliam-McEachean, gave [name] the opportunity to ask questions and answered the questions asked (if any). I affirm that Lauralye Pulliam-McEachean then provided consent for she's participation in this program.     Nicholis Stepanek Greenauer-Moran                             

## 2022-06-23 ENCOUNTER — Encounter: Payer: Self-pay | Admitting: Physical Therapy

## 2022-06-23 ENCOUNTER — Other Ambulatory Visit: Payer: Self-pay

## 2022-06-23 ENCOUNTER — Ambulatory Visit: Payer: Medicare HMO | Attending: Physical Medicine & Rehabilitation | Admitting: Physical Therapy

## 2022-06-23 DIAGNOSIS — M25561 Pain in right knee: Secondary | ICD-10-CM | POA: Insufficient documentation

## 2022-06-23 DIAGNOSIS — R262 Difficulty in walking, not elsewhere classified: Secondary | ICD-10-CM | POA: Diagnosis present

## 2022-06-23 DIAGNOSIS — M1711 Unilateral primary osteoarthritis, right knee: Secondary | ICD-10-CM | POA: Diagnosis not present

## 2022-06-23 DIAGNOSIS — M25562 Pain in left knee: Secondary | ICD-10-CM | POA: Diagnosis present

## 2022-06-23 DIAGNOSIS — M6281 Muscle weakness (generalized): Secondary | ICD-10-CM | POA: Diagnosis present

## 2022-06-23 DIAGNOSIS — G8929 Other chronic pain: Secondary | ICD-10-CM | POA: Insufficient documentation

## 2022-06-23 NOTE — Therapy (Signed)
OUTPATIENT PHYSICAL THERAPY LOWER EXTREMITY EVALUATION   Patient Name: Danielle [name] MRN: [id number] DOB:1945/03/23, 77 y.o., female Today's Date: 06/23/2022  END OF SESSION:  PT End of Session - 06/23/22 1639     Visit Number 1    Number of Visits 16    Date for PT Re-Evaluation 08/18/22    Authorization Type [employer] -- auth requested 4/24    Progress Note Due on Visit 10    PT Start Time 1105    PT Stop Time 1145    PT Time Calculation (min) 40 min    Activity Tolerance Patient tolerated treatment well    Behavior During Therapy WFL for tasks assessed/performed             Past Medical History:  Diagnosis Date   Anticoagulated on Coumadin    managed by cardiology   Anxiety    Chronic constipation    Chronic systolic CHF (congestive heart failure) 2016   cardiologist--- dr end and followed by CHF clinic   CKD (chronic kidney disease), stage III    COPD (chronic obstructive pulmonary disease)    previous had seen pulmonology --- dr Sherene Sires, note in epic 03-14-2016, dx COPD GOLD 0   Coronary artery disease    cardiologist--- dr Bethena Roys;  last cardiac cath 01-10-20202 mild nonobstructive disease proxLAD;  per pt, had LHC prior to AVR in [place] 2016   DOE (dyspnea on exertion)    GERD (gastroesophageal reflux disease)    History of 2019 novel coronavirus disease (COVID-19) 12/19/2019   hospital admission w/ covid pneumonia , no intubatiion,  per pt symptoms resolved and back to baseline   History of cerebrovascular accident (CVA) with residual deficit    CVA in 2000 without residuals;  CVA post op AVR 01/ 2016 with residual right hand weakness   History of gastritis    History of pneumothorax    s/p right vats 11-19-2014 and  s/p left vats  12-13-2015   (both spontenaous due to bleb)   Hyperlipidemia    Hypertension    Hyperthyroidism    per pt followed by pcp,  dx approx 2014   Malignant neoplasm of upper-outer quadrant of left breast in female,  estrogen receptor positive 11/2016   oncologist-- dr Mosetta Putt--- dx 10/ 2018  ,  s/p left lumptectomy with node dissection;  completed radiation 04-15-2017, no chemo   Multiple thyroid nodules    in care everywhere pt had left thyroid nodule biospy 09-18-2014 benign   NICM (nonischemic cardiomyopathy) 2016   2016  ef 30%;  last echo in epic ef 45%   OA (osteoarthritis)    Osteoporosis    Paroxysmal atrial fibrillation    cardiologist--- dr end   Personal history of radiation therapy    left breast cancer  03-08-2017  to 04-05-2017   Thrombocytopenia    followed by dr Mosetta Putt   Valvular stenosis, aortic 03/12/2014   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   Wears dentures    full upper   Past Surgical History:  Procedure Laterality Date   AORTIC VALVE REPLACEMENT  03/12/2014   [facility] [place];  St Jude 23mm, medial Trifecta Bioprosthesis   BREAST LUMPECTOMY WITH RADIOACTIVE SEED AND SENTINEL LYMPH NODE BIOPSY Left 01/14/2017   Procedure: LEFT BREAST LUMPECTOMY WITH RADIOACTIVE SEED AND SENTINEL LYMPH NODE BIOPSY;  Surgeon: [name], MD;  Location: [facility] OR;  Service: General;  Laterality: Left;   CARDIAC CATHETERIZATION  02/12/2014   [facility] in [place]   HYSTEROSCOPY WITH D & C N/A 11/17/2020   Procedure: DILATATION AND CURETTAGE /HYSTEROSCOPY WITH MYOSURE;  Surgeon: [name], MD;  Location: [facility];  Service: Gynecology;  Laterality: N/A;   INTRAMEDULLARY (IM) NAIL INTERTROCHANTERIC Left 12/10/2015   Procedure: INTRAMEDULLARY (IM) NAIL INTERTROCHANTRIC;  Surgeon: Tarry Kos, MD;  Location: [facility] OR;  Service: Orthopedics;  Laterality: Left;   OPERATIVE ULTRASOUND N/A 11/17/2020   Procedure: OPERATIVE ULTRASOUND;  Surgeon: [name], MD;  Location: [facility];  Service: Gynecology;  Laterality: N/A;   PLEURADESIS Left 12/03/2015   Procedure: PLEURADESIS;  Surgeon: Loreli Slot, MD;  Location[facility];  Service: Thoracic;  Laterality: Left;   RESECTION OF APICAL BLEB Left 12/03/2015   Procedure: BLEBECTOMY;  Surgeon: Loreli Slot, MD;  Location: [facility] OR;  Service: Thoracic;  Laterality: Left;   RIGHT/LEFT HEART CATH AND CORONARY ANGIOGRAPHY N/A 03/10/2020   Procedure: RIGHT/LEFT HEART CATH AND CORONARY ANGIOGRAPHY;  Surgeon: [name], MD;  Location: [facility] INVASIVE CV LAB;  Service: Cardiovascular;  Laterality: N/A;   VIDEO ASSISTED THORACOSCOPY Left 12/03/2015   Procedure: VIDEO ASSISTED THORACOSCOPY;  Surgeon: Loreli Slot, MD;  Location: [facility] OR;  Service: Thoracic;  Laterality: Left;   VIDEO ASSISTED THORACOSCOPY (VATS) W/TALC PLEUADESIS Right 11/19/2014   in [place]   Patient Active Problem List   Diagnosis Date Noted   Gastroesophageal reflux disease 04/09/2022   Abdominal pain, periumbilical 04/09/2022   Chronic pain of both shoulders 12/17/2021   Depression, major, single episode, moderate 12/08/2020   Gait abnormality 07/31/2020   Paresthesia 07/31/2020   Low back pain with bilateral sciatica 07/31/2020   Pneumonia due to COVID-19 virus    Bacteremia due to Gram-positive bacteria 08/29/2018   Abscess    Cellulitis of left breast 08/28/2018   Cellulitis of left abdominal wall 08/28/2018   Routine general medical examination at a health care facility 04/28/2018   Stage 3b chronic kidney disease 09/27/2017   Recurrent genital herpes 04/13/2017   Chronic pain disorder 01/25/2017   Malignant neoplasm of upper-outer quadrant of left breast in female, estrogen receptor positive 12/16/2016   Chronic knee pain 12/07/2016   Chronic bilateral low back pain without sciatica 12/07/2016   Peripheral musculoskeletal gait disorder 12/07/2016   Encounter for therapeutic drug monitoring 06/16/2016   Generalized osteoarthritis of multiple sites 05/04/2016   Aortic valve disease 04/10/2016   S/P AVR 04/10/2016   NICM (nonischemic  cardiomyopathy) 04/10/2016   Upper airway cough syndrome 03/14/2016   Class 2 severe obesity with serious comorbidity in adult 03/14/2016   COPD (chronic obstructive pulmonary disease) 03/11/2016   Thrombocytopenia 12/12/2015   Anemia 12/12/2015   Vitamin D deficiency 12/12/2015   Acute urinary retention 12/11/2015   Osteoporosis 12/11/2015   Hip fracture 12/10/2015   Aortic atherosclerosis 12/10/2015   Lung blebs 12/03/2015   Pelvic fracture 08/19/2015   Fall at home 08/19/2015   Essential hypertension 08/19/2015   Hyperlipidemia LDL goal <70 08/19/2015   Asthma 08/19/2015   Paroxysmal atrial fibrillation 08/19/2015   Hyperthyroidism 08/19/2015   Chronic HFrEF (heart failure with reduced ejection fraction)    Coronary artery disease    Aortic stenosis     PCP: [place], Betty, MD  REFERRING PROVIDER: Erick Colace, MD  REFERRING DIAG: M17.11 (ICD-10-CM) - Primary osteoarthritis of right knee  THERAPY DIAG:  Difficulty in walking, not elsewhere classified  Muscle  weakness (generalized)  Chronic pain of right knee  Chronic pain of left knee  Rationale for Evaluation and Treatment: Rehabilitation  ONSET DATE: Since ~2015  SUBJECTIVE:   SUBJECTIVE STATEMENT: Pt reports bad arthritis in both knees. Pt states she has issues with the back of her L knee with walking. Pt has history of a rod in her L hip. Pt states she is not interested in knee replacement. Pt states sitting to standing hurts both knees (anteromedially). Pt states she tried aquatics but was too nervous. Uses RW in the house. Does not go grocery shopping. Uses w/c to sit as needed when washing up in the shower. Pt reports she primarily sits in a lift chair during the day.   PERTINENT HISTORY: L hip surgery, OA  PAIN:  Are you having pain? Yes: NPRS scale: currently 0, at worst 6 or 7/10 Pain location: anteromedial knees bilat Pain description: Dull, aching Aggravating factors: sit to stand, walking,  4-5 min standing Relieving factors: Biofreeze  PRECAUTIONS: None  WEIGHT BEARING RESTRICTIONS: No  FALLS:  Has patient fallen in last 6 months? No  LIVING ENVIRONMENT: Lives with: lives with their spouse Lives in: House/apartment Stairs: No Has following equipment at home: Environmental consultant - 2 wheeled and Wheelchair (manual)  OCCUPATION: Mostly sits  PLOF: Independent  PATIENT GOALS: Pt would like to walk a mile  NEXT MD VISIT: Nothing noted on file  OBJECTIVE:   DIAGNOSTIC FINDINGS: No new images for knee on file; last x-rays completed 2022  PATIENT SURVEYS:  FOTO 48; predicted 54  COGNITION: Overall cognitive status: Within functional limits for tasks assessed     SENSATION: WFL  EDEMA:  None noted  MUSCLE LENGTH: Hamstrings: Right 90 deg; Left 80 deg Thomas test: did not assess  POSTURE: rounded shoulders and flexed trunk   PALPATION: L quad tenderness  LOWER EXTREMITY ROM:  Active ROM Right eval Left eval  Hip flexion    Hip extension    Hip abduction    Hip adduction    Hip internal rotation    Hip external rotation    Knee flexion 100 85  Knee extension 0 0  Ankle dorsiflexion    Ankle plantarflexion    Ankle inversion    Ankle eversion     (Blank rows = not tested)  LOWER EXTREMITY MMT:  MMT Right eval Left eval  Hip flexion 5 4  Hip extension 3+ 3-  Hip abduction 3 3  Hip adduction    Hip internal rotation    Hip external rotation    Knee flexion 5 5  Knee extension 4 3+  Ankle dorsiflexion    Ankle plantarflexion    Ankle inversion    Ankle eversion     (Blank rows = not tested)  LOWER EXTREMITY SPECIAL TESTS:  Did not assess  FUNCTIONAL TESTS:  6 MWT: TBA SLR: 10 deg on L, >80 deg on R 5 times sit to stand: 46.54 sec with UE support  GAIT: Distance walked: 100' Assistive device utilized: Environmental consultant - 2 wheeled Level of assistance: CGA Comments: 0.25 m/s gait speed, antalgic, L knee remains extended throughout, step to  pattern with R LE leading, compensatory trunk lean during stance phase   TODAY'S TREATMENT:  DATE: 06/23/22 See below    PATIENT EDUCATION:  Education details: Exam findings, initial POC Person educated: Patient Education method: Explanation, Demonstration, and Handouts Education comprehension: verbalized understanding, returned demonstration, and needs further education  HOME EXERCISE PROGRAM: Access Code: [email or web address] URL: [email or web address] Date: 06/23/2022 Prepared by: Vernon Prey April Kirstie Peri  Exercises - Seated Quad Set  - 1 x daily - 7 x weekly - 2 sets - 10 reps - 3 sec hold - Supine Heel Slide with Strap  - 1 x daily - 7 x weekly - 2 sets - 10 reps - Supine Gluteal Sets  - 1 x daily - 7 x weekly - 2 sets - 10 reps - Supine Hip Abduction  - 1 x daily - 7 x weekly - 2 sets - 10 reps - Small Range Straight Leg Raise  - 1 x daily - 7 x weekly - 2 sets - 10 reps  ASSESSMENT:  CLINICAL IMPRESSION: Patient is a 77 y.o. F who was seen today for physical therapy evaluation and treatment for bilat knee pain. Assessment significant for decreased knee ROM (L worse than R), generalized bilat LE weakness, pain, decreased endurance and balance. Pt would benefit from PT to address these issues for improved home and community mobility. Pt defers aquatics as she has fear of the water. Pt's greatest goal is be able to amb 1 mile.   OBJECTIVE IMPAIRMENTS: Abnormal gait, decreased activity tolerance, decreased balance, decreased endurance, decreased mobility, difficulty walking, decreased ROM, decreased strength, hypomobility, increased fascial restrictions, impaired flexibility, improper body mechanics, postural dysfunction, and pain.   ACTIVITY LIMITATIONS: standing, squatting, transfers, bed mobility, and locomotion level  PARTICIPATION LIMITATIONS:  community activity  PERSONAL FACTORS: Age, Fitness, Past/current experiences, and Time since onset of injury/illness/exacerbation are also affecting patient's functional outcome.   REHAB POTENTIAL: Fair Increased bilat knee arthritis  CLINICAL DECISION MAKING: Evolving/moderate complexity  EVALUATION COMPLEXITY: Moderate   GOALS: Goals reviewed with patient? Yes  SHORT TERM GOALS: Target date: 07/21/2022  Pt will be ind with initial HEP Baseline: Goal status: INITIAL  2.  Pt will be able to tolerate walking 6 min with RW for 6 MWT to better assess endurance Baseline: Unable Goal status: INITIAL  3.  Pt will have improved knee ROM to at least 90 deg for sit<>stand transfers Baseline:  Goal status: INITIAL    LONG TERM GOALS: Target date: 08/18/2022   Pt will be ind with management and progression of HEP Baseline:  Goal status: INITIAL  2.  Pt will be able to amb at least 10 min to work towards pt's goal to amb 1 mile Baseline: Difficulty with ambulating to the first bed in gym Goal status: INITIAL  3.  Pt will be able to perform 5x STS in </= 30 sec to demo improving functional LE strength Baseline: 46.54 sec Goal status: INITIAL  4.  Pt will be able to have improved FOTO score to >/=54 Baseline:  Goal status: INITIAL  5.  Pt will have improved gait speed to >/=0.3 m/s to demo MCID Baseline: 0.25 m/s Goal status: INITIAL   PLAN:  PT FREQUENCY: 2x/week  PT DURATION: 8 weeks  PLANNED INTERVENTIONS: Therapeutic exercises, Therapeutic activity, Neuromuscular re-education, Balance training, Gait training, Patient/Family education, Self Care, Joint mobilization, Stair training, Dry Needling, Electrical stimulation, Cryotherapy, Moist heat, Taping, Vasopneumatic device, Ionotophoresis /ml Dexamethasone, Manual therapy, and Re-evaluation  PLAN FOR NEXT SESSION: Assess response to HEP. Work on [employer] LE strengthening and endurance. Work on knee ROM as  pt tolerates.  Assess 6 minute walk if able.    Radhika Dershem April Ma L Ordean Fouts, PT 06/23/2022, 4:39 PM   Referring diagnosis? M17.11 (ICD-10-CM) - Primary osteoarthritis of right knee Treatment diagnosis? (if different than referring diagnosis) R26.2 Difficulty in Walking What was this (referring dx) caused by? []  Surgery []  Fall [x]  Ongoing issue [x]  Arthritis []  Other: ____________  Laterality: []  Rt []  Lt [x]  Both  Check all possible CPT codes:  *CHOOSE 10 OR LESS*    []  97110 (Therapeutic Exercise)  []  92507 (SLP Treatment)  []  97112 (Neuro Re-ed)   []  92526 (Swallowing Treatment)   []  97116 (Gait Training)   []  [id number] (Cognitive Training, 1st 15 minutes) []  97140 (Manual Therapy)   []  97130 (Cognitive Training, each add'l 15 minutes)  []  97164 (Re-evaluation)                              []  Other, List CPT Code ____________  []  97530 (Therapeutic Activities)     []  97535 (Self Care)   [x]  All codes above (97110 - 97535)  []  97012 (Mechanical Traction)  [x]  97014 (E-stim Unattended)  []  97032 (E-stim manual)  [x]  97033 (Ionto)  []  97035 (Ultrasound) []  97750 (Physical Performance Training) []  [id number] (Aquatic Therapy) []  97016 (Vasopneumatic Device) []  [id number] (Paraffin) []  97034 (Contrast Bath) []  97597 (Wound Care 1st 20 sq cm) []  97598 (Wound Care each add'l 20 sq cm) []  97760 (Orthotic Fabrication, Fitting, Training Initial) []  [id number] (Prosthetic Management and Training Initial) []  [id number] (Orthotic or Prosthetic Training/ Modification Subsequent)

## 2022-06-30 ENCOUNTER — Other Ambulatory Visit: Payer: Self-pay

## 2022-06-30 DIAGNOSIS — M159 Polyosteoarthritis, unspecified: Secondary | ICD-10-CM

## 2022-06-30 DIAGNOSIS — G894 Chronic pain syndrome: Secondary | ICD-10-CM

## 2022-06-30 MED ORDER — TRAMADOL HCL 50 MG PO TABS: 50.0000 mg | ORAL_TABLET | Freq: Three times a day (TID) | ORAL | 0 refills | Status: DC

## 2022-06-30 NOTE — Telephone Encounter (Signed)
Filled  Written  ID  Drug  QTY  Days  Prescriber  RX #  Dispenser  Refill  Daily Dose*  Pymt Type  PMP  04/06/2022 03/04/2022 1  Tramadol Hcl 50 Mg Tablet 90.00 30 An Kir [id number] [facility] (2001) 0/5 30.00 MME Medicare [place] 12/10/2021 07/28/2021 1  Tramadol Hcl 50 Mg Tablet 90.00 30 An Kir [id number] [facility] (2001) 1/5 30.00 MME Medicare [place]  Patient is out of Tramadol. Please send Rx to [facility] on [address]. Thank you.

## 2022-07-05 ENCOUNTER — Telehealth: Payer: Self-pay

## 2022-07-05 NOTE — Telephone Encounter (Signed)
   Telephone encounter was:  Unsuccessful.  07/05/2022 Name: Cynithia Breisch MRN: [id number] DOB: 10-19-1945  Unsuccessful outbound call made today to assist with:  Transportation Needs   Outreach Attempt:  2nd Attempt  A HIPAA compliant voice message was left requesting a return call.  Instructed patient to call back.    Lenard Forth [facility] Guide, [facility] [phone] [address] [place], [place], [place] [address] Phone: [phone] Email: [email or web address]

## 2022-07-05 NOTE — Telephone Encounter (Signed)
   Telephone encounter was:  Unsuccessful.  07/05/2022 Name: Danielle [name] MRN: [id number] DOB: 01-12-1946  Unsuccessful outbound call made today to assist with:  Transportation Needs   Outreach Attempt:  1st Attempt    Unable to leave a message   Lenard Forth [facility] Guide, [facility] [phone] [address] [place], [place], [place] [address] Phone: [phone] Email: [email or web address]

## 2022-07-06 ENCOUNTER — Telehealth: Payer: Self-pay

## 2022-07-06 NOTE — Telephone Encounter (Signed)
   Telephone encounter was:  Successful.  07/06/2022 Name: Danielle [name] MRN: [id number] DOB: Dec 27, 1945  [name] is a 77 y.o. year old female who is a primary care patient of [place], Timoteo Expose, MD . The community resource team was consulted for assistance with Transportation Needs   Care guide performed the following interventions: Patient provided with information about care guide support team and interviewed to confirm resource needs.Patient is not a [facility] patient and called the transportation line for her appointment. I called [employer] with her husband and she has benefits and is set up with 18 round trips rides. I will mail other resources   Follow Up Plan:  No further follow up planned at this time. The patient has been provided with needed resources.    Lenard Forth [facility] Guide, [facility] [phone] [address] [place], [place], [place] [address] Phone: [phone] Email: [email or web address]

## 2022-07-07 ENCOUNTER — Ambulatory Visit: Payer: Medicare HMO

## 2022-07-13 ENCOUNTER — Ambulatory Visit: Payer: Medicare HMO

## 2022-07-15 ENCOUNTER — Ambulatory Visit: Payer: Medicare HMO

## 2022-07-20 ENCOUNTER — Ambulatory Visit: Payer: Medicare HMO | Admitting: Physical Therapy

## 2022-07-22 ENCOUNTER — Ambulatory Visit: Payer: Medicare HMO | Admitting: Physical Therapy

## 2022-07-27 ENCOUNTER — Ambulatory Visit: Payer: Medicare HMO

## 2022-07-29 ENCOUNTER — Ambulatory Visit: Payer: Medicare HMO | Admitting: Physical Therapy

## 2022-07-29 NOTE — Progress Notes (Signed)
ADVANCED HF CLINIC NOTE   PCP: [place], [name], MD Cardiology: Dr. Okey Dupre HF cardiology: Dr. Shirlee Latch  77 y.o. with history of aortic insufficiency s/p bioprosthetic aortic valve and CHF was referred by Dr End for evaluation of CHF.  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  Since that time, she has had mild to moderately reduced LV systolic function, as low as 30-35% and as high at 45-50%.  Most recent echo in 9/21 showed EF 45-50%.  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  She has had paroxysmal atrial fibrillation and 2 prior CVAs.  She is on warfarin.  She has had breast cancer treated with lumpectomy and radiation, no chemotherapy.  She has history of hyperthyroidism and is on methimazole.  She has controlled HTN.    Cardiac MRI (10/22) with LV EF 41%, normal RV, subendocardial LGE in the mid-apical inferior wall and the mid-apical inferolateral wall.   Echo in 12/22 was a very technically difficult study with poor images, EF around 35%, RV poorly visualized, the bioprosthetic aortic valve looked ok.    Echo 11/23 EF up to 50% with mild LVH, RV mildly enlarged with normal systolic function, bioprosthetic aortic valve functioning normally.   Today she returns for AHF follow up with her husband. Overall feeling not too good. Denies palpitations, CP, dizziness, or PND/Orthopnea. SOB started around 3-4 weeks ago, happens intermittently when she sits. Sometimes notices ankle edema, goes down with torsemide and elevates them night.  Does not wear compression socks. Appetite ok. No fever or chills. Weight at home 240 pounds. Taking all medications. Denies ETOH, smoking. Uses walker to get around at home. Has been taking Torsemide 20 mg QOD. Was going to PT but had a fall and stopped going, plans to go back once her legs less painful  Labs (5/22): K 4.9, creatinine 1.21 Labs (9/22): K 4.9, creatinine  1.4 Labs (10/22): K 3.4, creatinine 1.8, TSH normal Labs (8/23): K 4.6, creatinine 1.83 Labs (9/23): K 4.9, creatinine 1.69 Labs (3/24): K 4.9, SCr 1.46  ECG (personally reviewed): NSR 77 bpm  PMH: 1. Left breast cancer: Treated with lumpectomy and radiation, no chemotherapy.  2. Hyperthyroidism 3. Aortic insufficiency: Severe, found when she lived in [place].  She had bioprosthetic aortic valve replacement at [facility] in 1/16.  4. Atrial fibrillation: Paroxysmal. 5. CVA x 2 6. HTN 7. Hyperlipidemia 8. Chronic systolic CHF: Nonischemic cardiomyopathy.  - Echo (3/16): EF 45-50% - Echo (1/17): EF 30-35% - Echo (2/18): EF 35-40% - Echo (11/20): EF 35-40% - Echo (9/21): EF 45-50%, global hypokinesis, mild LVH, bioprosthetic aortic valve mean gradient 10 mmHg, normal RV - RHC/LHC (1/22): mean RA 9, PA 35/20, mean PCWP 21, CI 3.2.  Nonobstructive CAD.  - cMRI (10/22): LV EF 41%, normal RV, subendocardial LGE in the mid-apical inferior wall and the mid-apical inferolateral wall. - Echo (12/22): Very technically difficult study with poor images, EF around 35%, RV poorly visualized, the bioprosthetic aortic valve looked ok. - Echo (11/23): EF up to 50% with mild LVH, RV mildly enlarged with normal systolic function, bioprosthetic aortic valve functioning normally.  9. H/o spontaneous PTX  Social History   Socioeconomic History   Marital status: Married    Spouse name: Sherwood   Number of children: 0   Years of education: 12   Highest education level: Bachelor's degree (e.g., BA, AB, BS)  Occupational History   Occupation: Retired in  2004  Tobacco Use   Smoking status: Former    Packs/day: 0.25    Years: 10.00    Additional pack years: 0.00    Total pack years: 2.50    Types: Cigarettes    Quit date: 2012    Years since quitting: 12.4   Smokeless tobacco: Never  Vaping Use   Vaping Use: Never used  Substance and Sexual Activity   Alcohol use: No   Drug use: Never    Sexual activity: Yes    Birth control/protection: Post-menopausal  Other Topics Concern   Not on file  Social History Narrative   Lives with husband.  Ambulated independently.   Right-handed.   No daily caffeine use.   Social Determinants of Health   Financial Resource Strain: Low Risk  (02/01/2022)   Overall Financial Resource Strain (CARDIA)    Difficulty of Paying Living Expenses: Not hard at all  Recent Concern: Financial Resource Strain - Medium Risk (01/25/2022)   Overall Financial Resource Strain (CARDIA)    Difficulty of Paying Living Expenses: Somewhat hard  Food Insecurity: No Food Insecurity (02/01/2022)   Hunger Vital Sign    Worried About Running Out of Food in the Last Year: Never true    Ran Out of Food in the Last Year: Never true  Transportation Needs: No Transportation Needs (02/01/2022)   PRAPARE - Administrator, Civil Service (Medical): No    Lack of Transportation (Non-Medical): No  Physical Activity: Inactive (02/01/2022)   Exercise Vital Sign    Days of Exercise per Week: 0 days    Minutes of Exercise per Session: 0 min  Stress: No Stress Concern Present (02/01/2022)   [name]    Feeling of Stress : Not at all  Social Connections: Socially Integrated (02/01/2022)   Social Connection and Isolation Panel [NHANES]    Frequency of Communication with Friends and Family: More than three times a week    Frequency of Social Gatherings with Friends and Family: More than three times a week    Attends Religious Services: More than 4 times per year    Active Member of [name]: Yes    Attends Engineer, structural: More than 4 times per year    Marital Status: Married  Catering manager Violence: Not At Risk (02/01/2022)   Humiliation, Afraid, Rape, and Kick questionnaire    Fear of Current or Ex-Partner: No    Emotionally Abused: No    Physically Abused: No     Sexually Abused: No   Family History  Problem Relation Age of Onset   Diabetes Mother    Heart attack Mother 63   Diabetes Father    Lung cancer Father    Diabetes Sister    Thyroid disease Sister    Diabetes Sister    HIV Brother    Colon polyps Neg Hx    Colon cancer Neg Hx    ROS: All systems reviewed and negative except as per HPI.   Current Outpatient Medications  Medication Sig Dispense Refill   albuterol (PROVENTIL HFA;VENTOLIN HFA) 108 (90 Base) MCG/ACT inhaler Inhale 2 puffs into the lungs every 6 (six) hours as needed for wheezing or shortness of breath.     apixaban (ELIQUIS) 5 MG TABS tablet Take 1 tablet (5 mg total) by mouth 2 (two) times daily. 180 tablet 1   ascorbic acid (VITAMIN C) 500 MG tablet Take 500 mg by mouth daily.  carvedilol (COREG) 25 MG tablet Take 1 tablet (25 mg total) by mouth 2 (two) times daily with a meal. 180 tablet 3   Cholecalciferol 25 MCG (1000 UT) tablet Take 1,000 Units by mouth daily.     cyclobenzaprine (FLEXERIL) 5 MG tablet Take 1 tablet (5 mg total) by mouth 2 (two) times daily as needed for muscle spasms. 20 tablet 0   fluticasone (FLONASE) 50 MCG/ACT nasal spray Place 1 spray into both nostrils daily. 16 g 3   methimazole (TAPAZOLE) 5 MG tablet Take 3 tablets (15 mg total) by mouth daily. 270 tablet 3   Multiple Vitamin (MULTIVITAMIN) tablet Take 1 tablet by mouth daily.     omeprazole (PRILOSEC) 40 MG capsule Take 1 capsule (40 mg total) by mouth daily. 60 capsule 0   rosuvastatin (CRESTOR) 40 MG tablet Take 1 tablet (40 mg total) by mouth daily. 90 tablet 3   sacubitril-valsartan (ENTRESTO) 49-51 MG Take 1 tablet by mouth 2 (two) times daily. 60 tablet 4   senna-docusate (SENOKOT-S) 8.6-50 MG tablet Take 2 tablets by mouth at bedtime.     torsemide (DEMADEX) 20 MG tablet Take 1 tablet (20 mg total) by mouth as needed (weight gain / swelling).     traMADol (ULTRAM) 50 MG tablet Take 1 tablet (50 mg total) by mouth 3 (three) times  daily. 90 tablet 0   trolamine salicylate (ASPERCREME) 10 % cream Apply 1 application topically as needed for muscle pain.     valACYclovir (VALTREX) 500 MG tablet Take on tablet by mouth twice a day for 3 days as needed for an outbreak 30 tablet 0   No current facility-administered medications for this encounter.   Wt Readings from Last 3 Encounters:  07/30/22 112.3 kg (247 lb 9.6 oz)  06/04/22 106.6 kg (235 lb)  04/09/22 100.7 kg (222 lb)   BP 118/72   Pulse 72   Wt 112.3 kg (247 lb 9.6 oz)   SpO2 96%   BMI 38.78 kg/m  General:  well appearing.  No respiratory difficulty. Arrived in [facility] HEENT: normal Neck: supple. JVD ~14 cm. Carotids 2+ bilat; no bruits. No lymphadenopathy or thyromegaly appreciated. Cor: PMI nondisplaced. Regular rate & rhythm. No rubs, gallops or murmurs. Lungs: clear Abdomen: soft, nontender, nondistended. No hepatosplenomegaly. No bruits or masses. Good bowel sounds. Extremities: no cyanosis, clubbing, rash, +1 BLE edema  Neuro: alert & oriented x 3, cranial nerves grossly intact. moves all 4 extremities w/o difficulty. Affect pleasant.   ReDS 42%  Assessment/Plan: 1. Chronic HF with mid range EF: Nonischemic cardiomyopathy.  Echo in 9/21 showed EF 45-50%, global hypokinesis, mild LVH, bioprosthetic aortic valve mean gradient 10 mmHg, normal RV.  Cause of cardiomyopathy is uncertain, mild CAD on 1/22 cath.  It is possible that this is chronic effect of long-standing AI that was not improved with valve replacement.  Also consider long-standing HTN.  Cardiac MRI in 10/22 showed LV EF 41%, normal RV, subendocardial LGE in the mid-apical inferior wall and the mid-apical inferolateral wall. The LGE pattern does not look like cardiac amyloidosis.  The subendocardial LGE looks like it came from prior MI but minimal CAD on cath => ?prior embolism down a coronary related to atrial fibrillation. Echo in 12/22 was a very poor study, EF looked to be around 35%.  Echo 11/23 EF  up to 50% with mild LVH, RV mildly enlarged with normal systolic function, bioprosthetic aortic valve functioning normally.  NYHA class III, volume elevated on exam. ReDS  42%. Weight up 12 lbs since April.  - Has been taking torsemide 20 mg QOD. Increase to 40 mg daily for the next 3 days, then start 20 mg daily - Off Farxiga due to yeast infections.  - Continue Coreg 25 mg bid.   - Continue Entresto 49/51 mg bid.  - Increase spironolactone 12.5>25 mg daily.  - wear compression socks at home, has some - BMET/BNP today. Repeat BMET 1 week 2. Aortic valve disorders: Per report, had severe AI.  Now s/p bioprosthetic aortic valve replacement.  The valve functions normally on today's echo. - Will need antibiotic prophylaxis with dental work.  3. HTN: BP controlled on current regimen.  4. Atrial fibrillation: Paroxysmal. NSR today.   - Continue apixaban, no bleeding.  5. Obesity: Body mass index is 38.78 kg/m. - Weight down,unable to tolerate semaglutide.  6. Hip pain/ leg pain - Avoid ibuprofen with CHF.  - getting better, plan to start working with PT once leg pain improves.   Follow up in 2 weeks with APP to reassess volume.   Alen Bleacher AGACNP-BC  07/30/2022

## 2022-07-30 ENCOUNTER — Encounter (HOSPITAL_COMMUNITY): Payer: Self-pay

## 2022-07-30 ENCOUNTER — Ambulatory Visit (HOSPITAL_COMMUNITY)
Admission: RE | Admit: 2022-07-30 | Discharge: 2022-07-30 | Disposition: A | Discharge status: Home / Self Care | Payer: Medicare HMO | Source: Ambulatory Visit | Attending: Internal Medicine | Admitting: Internal Medicine

## 2022-07-30 VITALS — BP 118/72 | HR 72 | Wt 247.6 lb

## 2022-07-30 DIAGNOSIS — Z953 Presence of xenogenic heart valve: Secondary | ICD-10-CM | POA: Diagnosis not present

## 2022-07-30 DIAGNOSIS — Z86718 Personal history of other venous thrombosis and embolism: Secondary | ICD-10-CM | POA: Insufficient documentation

## 2022-07-30 DIAGNOSIS — Z79899 Other long term (current) drug therapy: Secondary | ICD-10-CM | POA: Insufficient documentation

## 2022-07-30 DIAGNOSIS — I428 Other cardiomyopathies: Secondary | ICD-10-CM | POA: Insufficient documentation

## 2022-07-30 DIAGNOSIS — E0590 Thyrotoxicosis, unspecified without thyrotoxic crisis or storm: Secondary | ICD-10-CM | POA: Insufficient documentation

## 2022-07-30 DIAGNOSIS — R0602 Shortness of breath: Secondary | ICD-10-CM | POA: Diagnosis present

## 2022-07-30 DIAGNOSIS — I359 Nonrheumatic aortic valve disorder, unspecified: Secondary | ICD-10-CM

## 2022-07-30 DIAGNOSIS — I110 Hypertensive heart disease with heart failure: Secondary | ICD-10-CM | POA: Insufficient documentation

## 2022-07-30 DIAGNOSIS — Z6838 Body mass index (BMI) 38.0-38.9, adult: Secondary | ICD-10-CM | POA: Insufficient documentation

## 2022-07-30 DIAGNOSIS — I10 Essential (primary) hypertension: Secondary | ICD-10-CM

## 2022-07-30 DIAGNOSIS — I5022 Chronic systolic (congestive) heart failure: Secondary | ICD-10-CM | POA: Insufficient documentation

## 2022-07-30 DIAGNOSIS — M25559 Pain in unspecified hip: Secondary | ICD-10-CM | POA: Insufficient documentation

## 2022-07-30 DIAGNOSIS — I351 Nonrheumatic aortic (valve) insufficiency: Secondary | ICD-10-CM | POA: Insufficient documentation

## 2022-07-30 DIAGNOSIS — Z7901 Long term (current) use of anticoagulants: Secondary | ICD-10-CM | POA: Insufficient documentation

## 2022-07-30 DIAGNOSIS — I2510 Atherosclerotic heart disease of native coronary artery without angina pectoris: Secondary | ICD-10-CM | POA: Diagnosis not present

## 2022-07-30 DIAGNOSIS — E669 Obesity, unspecified: Secondary | ICD-10-CM | POA: Diagnosis not present

## 2022-07-30 DIAGNOSIS — I480 Paroxysmal atrial fibrillation: Secondary | ICD-10-CM | POA: Insufficient documentation

## 2022-07-30 DIAGNOSIS — I252 Old myocardial infarction: Secondary | ICD-10-CM | POA: Diagnosis not present

## 2022-07-30 LAB — BASIC METABOLIC PANEL
Anion gap: 15 (ref 5–15)
BUN: 16 mg/dL (ref 8–23)
CO2: 19 mmol/L — ABNORMAL LOW (ref 22–32)
Calcium: 10.2 mg/dL (ref 8.9–10.3)
Chloride: 106 mmol/L (ref 98–111)
Creatinine, Ser: 1.4 mg/dL — ABNORMAL HIGH (ref 0.44–1.00)
GFR, Estimated: 39 mL/min — ABNORMAL LOW (ref >60)
Glucose, Bld: 96 mg/dL (ref 70–99)
Potassium: 4.5 mmol/L (ref 3.5–5.1)
Sodium: 140 mmol/L (ref 135–145)

## 2022-07-30 LAB — BRAIN NATRIURETIC PEPTIDE: B Natriuretic Peptide: 111.1 pg/mL — ABNORMAL HIGH (ref 0.0–100.0)

## 2022-07-30 MED ORDER — SPIRONOLACTONE 25 MG PO TABS: 25.0000 mg | ORAL_TABLET | Freq: Every day | ORAL | 3 refills | Status: DC

## 2022-07-30 MED ORDER — TORSEMIDE 20 MG PO TABS: 20.0000 mg | ORAL_TABLET | Freq: Every day | ORAL | Status: DC

## 2022-07-30 NOTE — Progress Notes (Signed)
ReDS Vest / Clip - 07/30/22 1100       ReDS Vest / Clip   Station Marker C    Ruler Value 30.5    ReDS Value Range High volume overload    ReDS Actual Value 42

## 2022-07-30 NOTE — Patient Instructions (Addendum)
Thank you for coming in today  If you had labs drawn today, any labs that are abnormal the clinic will call you No news is good news  Medications: INCREASE Spironolactone 25 mg 1 tablet daily  INCREASE Torsemide to 40 mg for 3 days after the 3 days START Torsemide 20 mg daily   Follow up appointments: Your physician recommends that you return for lab work in: 1 week BMET   Your physician recommends that you schedule a follow-up appointment in:  2-3 weeks in clinic    Do the following things EVERYDAY: Weigh yourself in the morning before breakfast. Write it down and keep it in a log. Take your medicines as prescribed Eat low salt foods--Limit salt (sodium) to 2000 mg per day.  Stay as active as you can everyday Limit all fluids for the day to less than 2 liters   At the [facility], you and your health needs are our priority. As part of our continuing mission to provide you with exceptional heart care, we have created designated Provider Care Teams. These Care Teams include your primary Cardiologist (physician) and Advanced Practice Providers (APPs- Physician Assistants and Nurse Practitioners) who all work together to provide you with the care you need, when you need it.   You may see any of the following providers on your designated Care Team at your next follow up: Dr Arvilla Meres Dr Marca Ancona Dr. Marcos Eke, NP Robbie Lis, [place] [facility] [place], [place] Brynda Peon, NP Karle Plumber, PharmD   Please be sure to bring in all your medications bottles to every appointment.    Thank you for choosing [facility]  If you have any questions or concerns before your next appointment please send [place] a message through [place] or call our office at [phone].    TO LEAVE A MESSAGE FOR THE NURSE SELECT OPTION 2, PLEASE LEAVE A MESSAGE INCLUDING: YOUR NAME DATE OF BIRTH CALL BACK  NUMBER REASON FOR CALL**this is important as we prioritize the call backs  YOU WILL RECEIVE A CALL BACK THE SAME DAY AS LONG AS YOU CALL BEFORE 4:00 PM

## 2022-08-03 ENCOUNTER — Ambulatory Visit: Payer: Medicare HMO

## 2022-08-05 ENCOUNTER — Encounter: Payer: Medicare HMO | Admitting: Physical Therapy

## 2022-08-06 ENCOUNTER — Ambulatory Visit (HOSPITAL_COMMUNITY)
Admission: RE | Admit: 2022-08-06 | Discharge: 2022-08-06 | Disposition: A | Discharge status: Home / Self Care | Payer: Medicare HMO | Source: Ambulatory Visit | Attending: Cardiology | Admitting: Cardiology

## 2022-08-06 DIAGNOSIS — I5022 Chronic systolic (congestive) heart failure: Secondary | ICD-10-CM | POA: Diagnosis present

## 2022-08-06 LAB — BASIC METABOLIC PANEL
Anion gap: 10 (ref 5–15)
BUN: 37 mg/dL — ABNORMAL HIGH (ref 8–23)
CO2: 25 mmol/L (ref 22–32)
Calcium: 10.6 mg/dL — ABNORMAL HIGH (ref 8.9–10.3)
Chloride: 102 mmol/L (ref 98–111)
Creatinine, Ser: 2.06 mg/dL — ABNORMAL HIGH (ref 0.44–1.00)
GFR, Estimated: 25 mL/min — ABNORMAL LOW (ref >60)
Glucose, Bld: 109 mg/dL — ABNORMAL HIGH (ref 70–99)
Potassium: 4.6 mmol/L (ref 3.5–5.1)
Sodium: 137 mmol/L (ref 135–145)

## 2022-08-10 ENCOUNTER — Encounter: Payer: Medicare HMO | Admitting: Physical Therapy

## 2022-08-12 ENCOUNTER — Encounter: Payer: Medicare HMO | Admitting: Physical Therapy

## 2022-08-17 ENCOUNTER — Ambulatory Visit: Payer: Medicare HMO

## 2022-08-19 ENCOUNTER — Ambulatory Visit: Payer: Medicare HMO | Admitting: Physical Therapy

## 2022-08-20 ENCOUNTER — Encounter (HOSPITAL_COMMUNITY): Payer: Medicare HMO

## 2022-08-24 ENCOUNTER — Encounter (HOSPITAL_COMMUNITY): Payer: Self-pay

## 2022-08-29 ENCOUNTER — Other Ambulatory Visit (HOSPITAL_COMMUNITY): Payer: Self-pay | Admitting: Family Medicine

## 2022-08-30 ENCOUNTER — Encounter: Payer: Self-pay | Admitting: *Deleted

## 2022-08-30 ENCOUNTER — Other Ambulatory Visit (HOSPITAL_COMMUNITY): Payer: Self-pay

## 2022-08-30 MED ORDER — CARVEDILOL 25 MG PO TABS: 25.0000 mg | ORAL_TABLET | Freq: Two times a day (BID) | ORAL | 3 refills | Status: AC

## 2022-08-30 NOTE — Progress Notes (Signed)
Eliquis samples had been pulled for the patient in the [place] office on 05/14/22. The patient never picked these up. Returning to inventory.   Eliquis 5 mg Lot: [id number] Exp: 07/2023 # 3 boxes  Lot: [id number] Exp: 06/2023 # 1 box

## 2022-09-19 IMAGING — MG DIGITAL DIAGNOSTIC BILAT W/ TOMO W/ CAD
8 of 14 series · 8 of 40 positions shown · non-contrast
Comparison: Previous exam(s).

CLINICAL DATA: 75-year-old female for annual follow-up. History of
LEFT breast cancer and lumpectomy in 2184.

EXAM:
DIGITAL DIAGNOSTIC BILATERAL MAMMOGRAM WITH CAD AND TOMO

[R CC synth-2D]
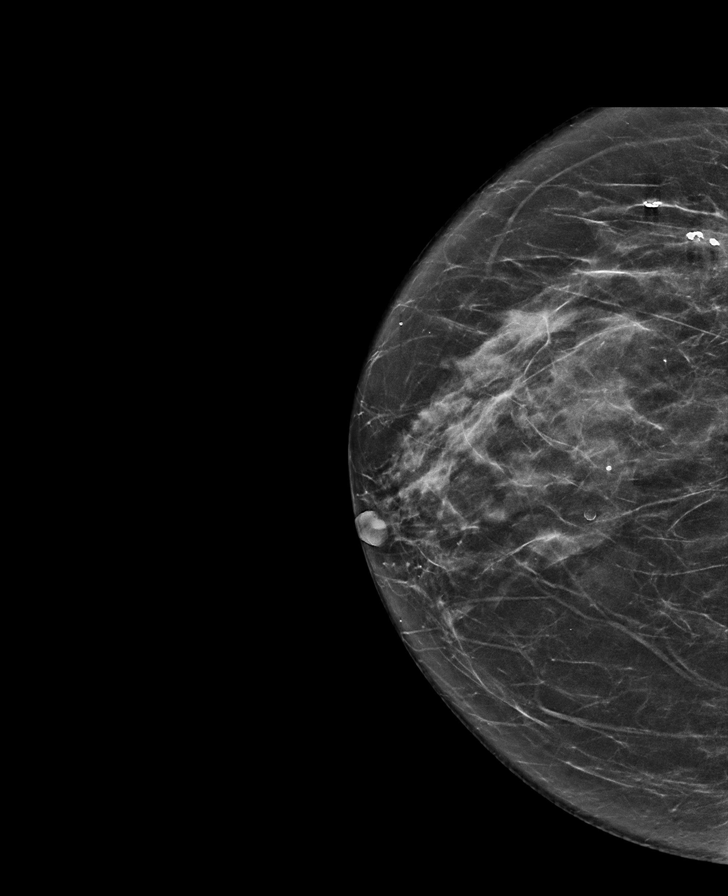

[L CC synth-2D (1 of 4)]
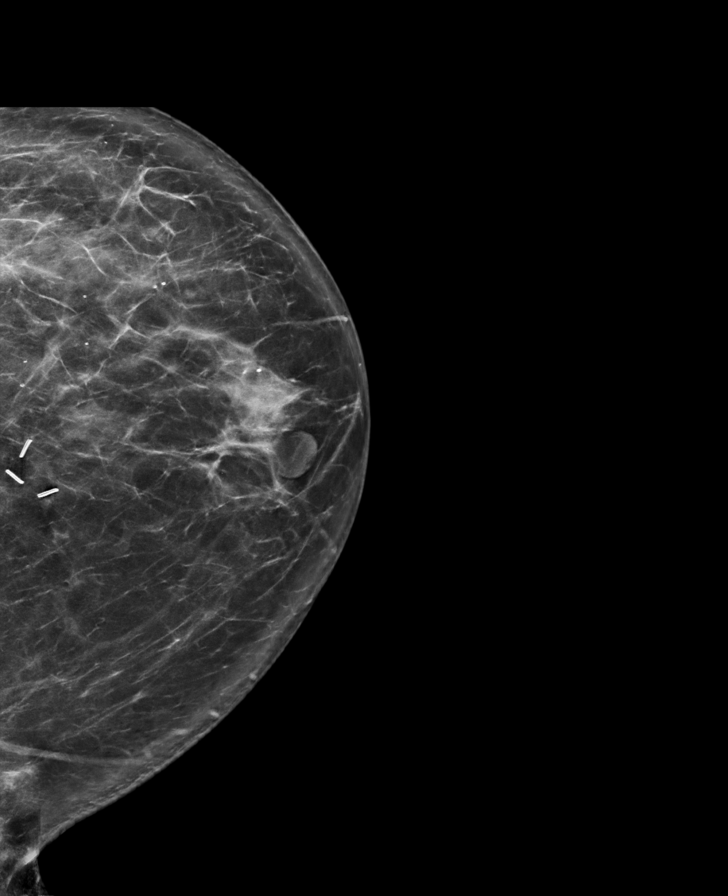

[L CC synth-2D (2 of 4)]
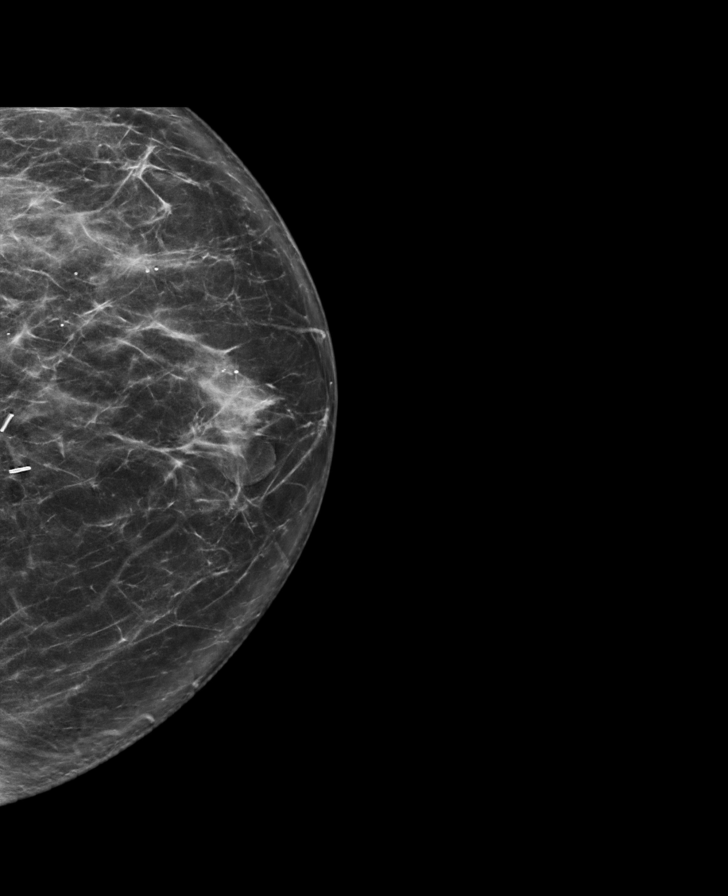

[L CC synth-2D (3 of 4)]
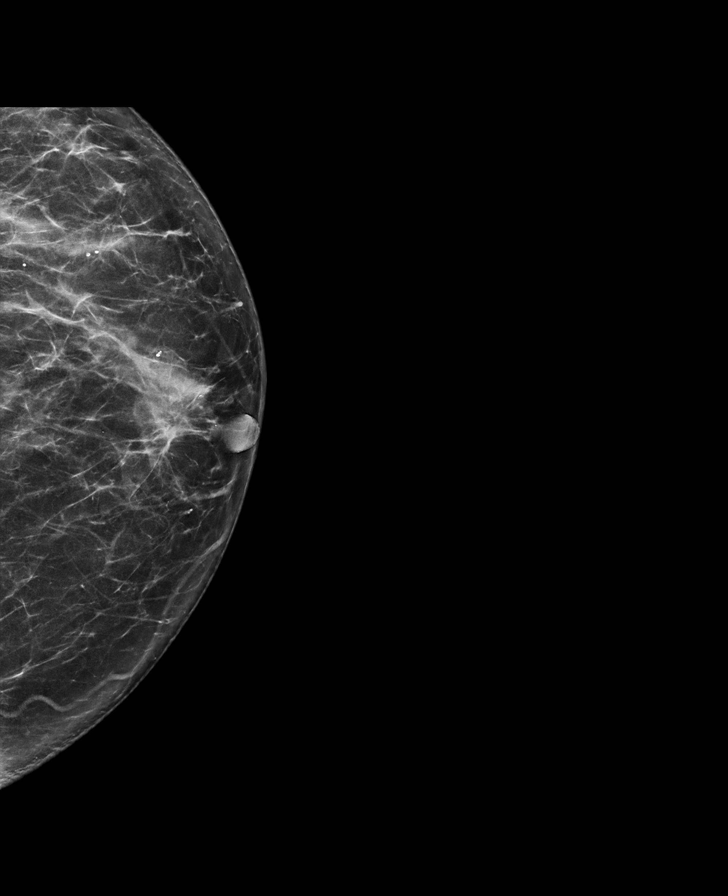

[L CC synth-2D (4 of 4)]
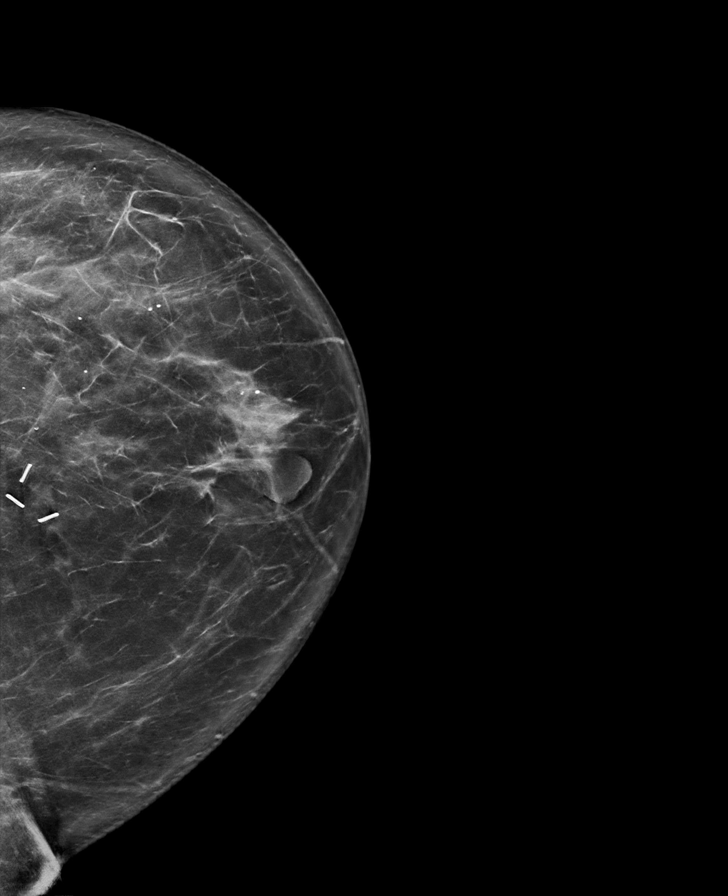

[L MLO synth-2D]
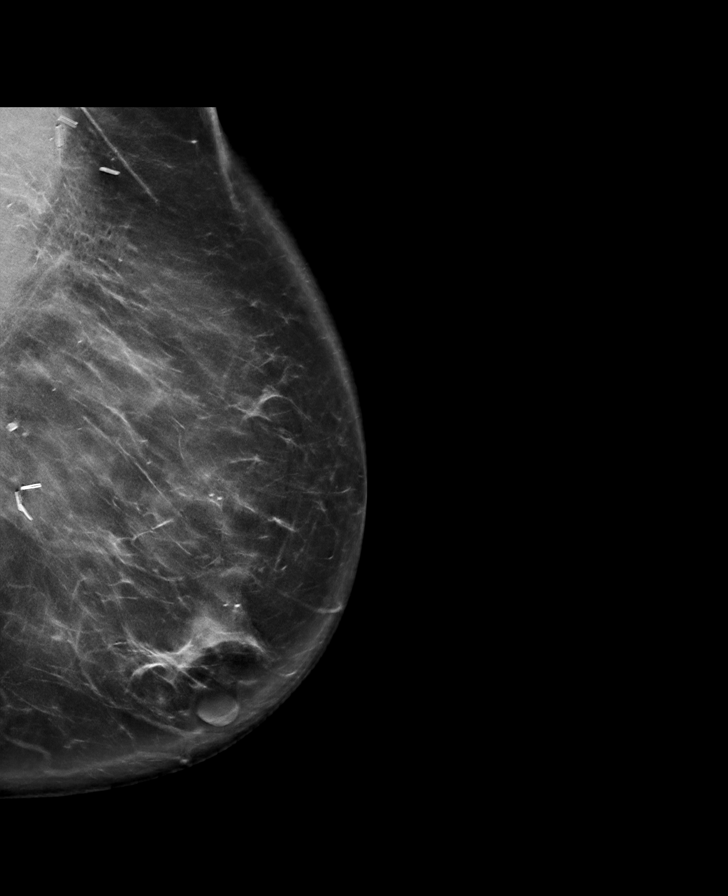

[R MLO synth-2D]
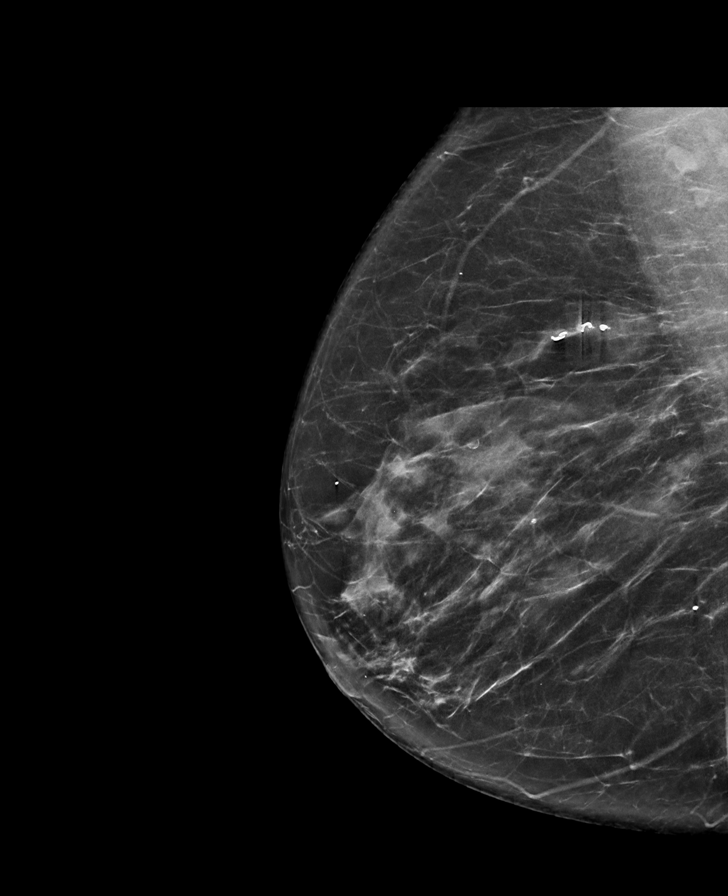

[L MLO tomo · tomo slice 53/105.0]
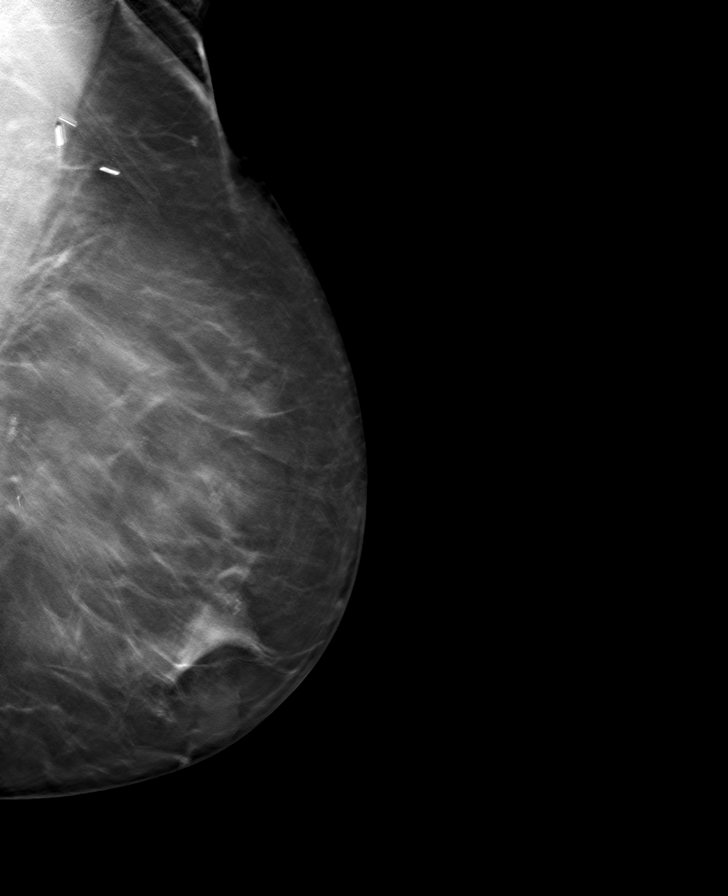

[8 of 40 positions shown; findings below may reference images not displayed]

ACR Breast Density Category b: There are scattered areas of
fibroglandular density.
FINDINGS: 2D and 3D full field views of both breasts demonstrate no suspicious
mass, nonsurgical distortion or worrisome calcifications.

Mammographic images were processed with CAD.
IMPRESSION: No evidence of breast malignancy.

RECOMMENDATION:
Per protocol, as the patient is now 2 or more years status post
lumpectomy, she may return to annual screening mammography in 1
year. However, given the history of breast cancer, the patient
remains eligible for annual diagnostic mammography if preferred.
(Code:XG-K-FVW)

I have discussed the findings and recommendations with the patient.
If applicable, a reminder letter will be sent to the patient
regarding the next appointment.

BI-RADS CATEGORY  2: Benign.

## 2022-10-27 ENCOUNTER — Telehealth: Payer: Self-pay

## 2022-10-27 NOTE — Telephone Encounter (Signed)
Mr. & Mrs. Danielle [name]  are requesting a referral for a Home Health Aide.  According to both she is not improving, with mobility. And  needs assistance at home.    Please advise.  (Call back phone [phone]).

## 2022-10-29 NOTE — Telephone Encounter (Signed)
I have let her know that she does not qualify for [facility] aide since she is not homebound service not have a skilled service in the home. Therefore any service would be private pay.She has verbalized understanding. She would need to see Dr Wynn Banker since she has not been seen since April of this year .

## 2022-11-17 ENCOUNTER — Other Ambulatory Visit: Payer: Self-pay

## 2022-11-17 DIAGNOSIS — C50412 Malignant neoplasm of upper-outer quadrant of left female breast: Secondary | ICD-10-CM

## 2022-11-18 ENCOUNTER — Inpatient Hospital Stay: Payer: Medicare HMO | Attending: Hematology

## 2022-11-18 ENCOUNTER — Inpatient Hospital Stay (HOSPITAL_BASED_OUTPATIENT_CLINIC_OR_DEPARTMENT_OTHER): Payer: Medicare HMO | Admitting: Hematology

## 2022-11-18 VITALS — BP 135/74 | HR 89 | Temp 98.4°F | Resp 18 | Ht 67.0 in | Wt 243.9 lb

## 2022-11-18 DIAGNOSIS — Z923 Personal history of irradiation: Secondary | ICD-10-CM | POA: Diagnosis not present

## 2022-11-18 DIAGNOSIS — C50412 Malignant neoplasm of upper-outer quadrant of left female breast: Secondary | ICD-10-CM

## 2022-11-18 DIAGNOSIS — Z79899 Other long term (current) drug therapy: Secondary | ICD-10-CM | POA: Diagnosis not present

## 2022-11-18 DIAGNOSIS — Z79810 Long term (current) use of selective estrogen receptor modulators (SERMs): Secondary | ICD-10-CM | POA: Insufficient documentation

## 2022-11-18 DIAGNOSIS — D696 Thrombocytopenia, unspecified: Secondary | ICD-10-CM | POA: Diagnosis not present

## 2022-11-18 DIAGNOSIS — G8929 Other chronic pain: Secondary | ICD-10-CM | POA: Diagnosis not present

## 2022-11-18 DIAGNOSIS — Z7901 Long term (current) use of anticoagulants: Secondary | ICD-10-CM | POA: Diagnosis not present

## 2022-11-18 DIAGNOSIS — Z170 Estrogen receptor positive status [ER+]: Secondary | ICD-10-CM

## 2022-11-18 DIAGNOSIS — M1990 Unspecified osteoarthritis, unspecified site: Secondary | ICD-10-CM | POA: Insufficient documentation

## 2022-11-18 LAB — CBC WITH DIFFERENTIAL (CANCER CENTER ONLY)
Abs Immature Granulocytes: 0.02 10*3/uL (ref 0.00–0.07)
Basophils Absolute: 0 10*3/uL (ref 0.0–0.1)
Basophils Relative: 1 %
Eosinophils Absolute: 0.2 10*3/uL (ref 0.0–0.5)
Eosinophils Relative: 4 %
HCT: 42.2 % (ref 36.0–46.0)
Hemoglobin: 13.8 g/dL (ref 12.0–15.0)
Immature Granulocytes: 0 %
Lymphocytes Relative: 23 %
Lymphs Abs: 1.2 10*3/uL (ref 0.7–4.0)
MCH: 31.2 pg (ref 26.0–34.0)
MCHC: 32.7 g/dL (ref 30.0–36.0)
MCV: 95.5 fL (ref 80.0–100.0)
Monocytes Absolute: 0.5 10*3/uL (ref 0.1–1.0)
Monocytes Relative: 9 %
Neutro Abs: 3.4 10*3/uL (ref 1.7–7.7)
Neutrophils Relative %: 63 %
Platelet Count: 50 10*3/uL — ABNORMAL LOW (ref 150–400)
RBC: 4.42 MIL/uL (ref 3.87–5.11)
RDW: 13.3 % (ref 11.5–15.5)
WBC Count: 5.4 10*3/uL (ref 4.0–10.5)
nRBC: 0 % (ref 0.0–0.2)

## 2022-11-18 LAB — CMP (CANCER CENTER ONLY)
ALT: 14 U/L (ref 0–44)
AST: 18 U/L (ref 15–41)
Albumin: 4.1 g/dL (ref 3.5–5.0)
Alkaline Phosphatase: 77 U/L (ref 38–126)
Anion gap: 4 — ABNORMAL LOW (ref 5–15)
BUN: 15 mg/dL (ref 8–23)
CO2: 27 mmol/L (ref 22–32)
Calcium: 10.3 mg/dL (ref 8.9–10.3)
Chloride: 108 mmol/L (ref 98–111)
Creatinine: 1.61 mg/dL — ABNORMAL HIGH (ref 0.44–1.00)
GFR, Estimated: 33 mL/min — ABNORMAL LOW (ref >60)
Glucose, Bld: 102 mg/dL — ABNORMAL HIGH (ref 70–99)
Potassium: 5 mmol/L (ref 3.5–5.1)
Sodium: 139 mmol/L (ref 135–145)
Total Bilirubin: 0.8 mg/dL (ref 0.3–1.2)
Total Protein: 7.4 g/dL (ref 6.5–8.1)

## 2022-11-18 MED ORDER — ELTROMBOPAG OLAMINE 25 MG PO TABS: 25.0000 mg | ORAL_TABLET | Freq: Every day | ORAL | 3 refills | Status: DC

## 2022-11-18 NOTE — Assessment & Plan Note (Signed)
Stage IB, pT2N0M0, ER+/PR+, HER2-, Grade II -diagnosed in 11/2016. S/p left lumpectomy and adjuvant radiation.  -Oncotype Dx showed low risk -She started Tamoxifen in 06/2017, discontinued 09/2020 due to postmenopausal bleeding. She is now on surveillance. -Continue annual mammogram

## 2022-11-18 NOTE — Progress Notes (Signed)
[facility]   [phone] [phone]   Clinic Follow up Note   Patient Care Team: [place], [name], MD as PCP - General (Family Medicine) End, Cristal Deer, MD as PCP - Cardiology (Cardiology) Malachy Mood, MD as Consulting Physician (Hematology) Dorothy Puffer, MD as Consulting Physician (Radiation Oncology) [name], MD as Consulting Physician (General Surgery) Axel Filler, Larna Daughters, NP as Nurse Practitioner (Hematology and Oncology) Verner Chol, [facility] (Inactive) as Pharmacist (Pharmacist) Pollyann Samples, NP as Nurse Practitioner (Nurse Practitioner)  Date of Service:  11/18/2022  CHIEF COMPLAINT: f/u of breast cancer   CURRENT THERAPY:  Cancer surveillance  ASSESSMENT:  Danielle [name] is a 77 y.o. female with   Malignant neoplasm of upper-outer quadrant of left breast in female, estrogen receptor positive (HCC) Stage IB, pT2N0M0, ER+/PR+, HER2-, Grade II -diagnosed in 11/2016. S/p left lumpectomy and adjuvant radiation.  -Oncotype Dx showed low risk -She started Tamoxifen in 06/2017, discontinued 09/2020 due to postmenopausal bleeding. She is now on surveillance. -Continue annual mammogram -Last mammogram in November 2023 was normal. -Order mammogram for November 2024. -Continue annual mammograms and self-checks. -Watch for bone pain as a sign of possible recurrence.  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 No bleeding symptoms. Currently on Eliquis for cardiac condition. -Start Promacta 25mg  daily to increase platelet count.  I discussed the benefit and potential side effect, she agrees to try -Check blood counts monthly to monitor response to treatment. -Return for follow-up in 3 months.  Nausea Chronic, intermittent nausea. Currently taking over-the-counter medication for motion sickness, which helps. -Continue current treatment as needed.  Arthritis Chronic pain, exacerbated by  high bed. -Recommend discussing prescription for adjustable bed with primary care physician at [facility].     Plan -I prescribed Promacta 25 mg daily for her thrombocytopenia -Lab monthly, follow-up in 3 months -Continue breast cancer surveillance, she is due for mammogram in November 2024    SUMMARY OF ONCOLOGIC HISTORY: Oncology History Overview Note  Cancer Staging Malignant neoplasm of upper-outer quadrant of left breast in female, estrogen receptor positive (HCC) Staging form: Breast, AJCC 8th Edition - Clinical stage from 12/13/2016: Stage IB (cT2, cN0, cM0, G2, ER: Positive, PR: Positive, HER2: Negative) - Signed by Malachy Mood, MD on 12/21/2016 - Pathologic stage from 01/14/2017: Stage IA (pT2, pN0, cM0, G1, ER: Positive, PR: Positive, HER2: Negative, Oncotype DX score: 4) - Signed by Malachy Mood, MD on 04/17/2017     Malignant neoplasm of upper-outer quadrant of left breast in female, estrogen receptor positive (HCC)  12/06/2016 Mammogram   Diagnostic Mammogram 12/06/16 IMPRESSION:  Suspicious mass in the left breast at 3 o'clock 2 cm from the nipple measuring 1.9 x 1.1 x 2.2 cm. RECOMMENDATION: Ultrasound-guided core biopsy of the mass in the 3 o'clock region of the left breast is recommended. The biopsy will be scheduled at the patient's convenience.   12/13/2016 Initial Biopsy   Diagnosis 12/13/16 Breast, left, needle core biopsy, 3:00 o'clock, 2cmfn - INVASIVE DUCTAL CARCINOMA - SEE COMMENT   12/16/2016 Initial Diagnosis   Malignant neoplasm of upper-outer quadrant of left breast in female, estrogen receptor positive (HCC)   12/17/2016 Receptors her2   Estrogen Receptor: 100%, POSITIVE, STRONG STAINING INTENSITY Progesterone Receptor: 100%, POSITIVE, STRONG STAINING INTENSITY Proliferation Marker Ki67: 30% HER2 - NEGATIVE    01/14/2017 Surgery   Left breast lumpectomy with Dr. Dwain Sarna   01/14/2017 Pathology Results   Diagnosis 01/14/17 1. Breast,  lumpectomy, Left - INVASIVE DUCTAL  CARCINOMA, GRADE I/III, SPANNING 2.1 CM. - DUCTAL CARCINOMA IN SITU, LOW GRADE. - INVASIVE CARCINOMA IS BROADLY PRESENT AT THE INFERIOR MARGIN OF SPECIMEN 1. - DUCTAL CARCINOMA IN SITU IS FOCALLY PRESENT AT THE INFERIOR MARGIN OF SPECIMEN 1 AND BROADLY LESS THAN 0.1 CM TO THE LATERAL MARGIN OF SPECIMEN 1. - SEE ONCOLOGY TABLE BELOW. 2. Breast, excision, Additional medial margin left - DUCTAL CARCINOMA IN SITU, LOW GRADE. - DUCTAL CARCINOMA IS FOCALLY LESS THAN 0.1 CM TO THE NEW MARGIN OF SPECIMEN 2. 3. Breast, excision, Additional lateral margin left - DUCTAL CARCINOMA IN SITU, LOW GRADE. - DUCTAL CARCINOMA IN SITU IS GREATER THAN 0.2 CM TO ALL MARGINS. 4. Breast, excision, Additional superior margin left - DUCTAL CARCINOMA IN SITU, LOW GRADE. - DUCTAL CARCINOMA IN SITU IS BROADLY LESS THAN 0.1 CM TO THE NEW MARGIN OF SPECIMEN 4. 5. Lymph node, sentinel, biopsy, Left axillary - THERE IS NO EVIDENCE OF CARCINOMA IN 1 OF 1 LYMPH NODE (0/1). 6. Breast, excision, Additional inferior margin left - DUCTAL CARCINOMA IN SITU, LOW GRADE. - DUCTAL CARCINOMA IN SITU IS GREATER THAN 0.2 CM TO ALL MARGINS.    01/14/2017 Oncotype testing   Her oncotype recurrence score is 4 and her distance recurrent on Tamoxifen alone is 3%.   03/08/2017 - 04/05/2017 Radiation Therapy   RT with Dr. Mitzi Hansen    06/2017 -  Anti-estrogen oral therapy   Tamoxifen daily   12/15/2017 Mammogram   12/15/2017 Mammogram IMPRESSION: New lumpectomy site left breast. No mammographic evidence of malignancy in either breast.      INTERVAL HISTORY:  Discussed the use of AI scribe software for clinical note transcription with the patient, who gave verbal consent to proceed  The patient, with a history of breast cancer, rheumatoid arthritis, and heart disease, presents for a routine follow-up. She reports ongoing nausea, which she has been managing with over-the-counter motion sickness  medication. She denies any knowledge of the cause of the nausea, which has been ongoing for an unspecified period of time.  The patient also has a history of low platelet counts, which have been monitored over time. She denies any bleeding and is currently on Eliquis for her heart condition. She reports that her cardiologist is aware of her low platelet count, which has been a chronic issue.  The patient also reports some arthritis pain, which is exacerbated by her high bed. She expresses a desire for a prescription for an adjustable bed to help manage this issue.  In terms of her breast cancer history, the patient had a mammogram in November 2023, which was clear. She is due for another mammogram in November 2024. She denies any new breast pain or lumps, but does report some scar tissue pain from her previous surgery. She had previously tried tamoxifen, but it was stopped due to bleeding.       All other systems were reviewed with the patient and are negative.  MEDICAL HISTORY:  Past Medical History:  Diagnosis Date   Anticoagulated on Coumadin    managed by cardiology   Anxiety    Chronic constipation    Chronic systolic CHF (congestive heart failure) (HCC) 2016   cardiologist--- dr end and followed by CHF clinic   CKD (chronic kidney disease), stage III (HCC)    COPD (chronic obstructive pulmonary disease) (HCC)    previous had seen pulmonology --- dr Sherene Sires, note in epic 03-14-2016, dx COPD GOLD 0   Coronary artery disease    cardiologist--- dr  eng;  last cardiac cath 01-10-20202 mild nonobstructive disease proxLAD;  per pt, had LHC prior to AVR in [place]   DOE (dyspnea on exertion)    GERD (gastroesophageal reflux disease)    History of 2019 novel coronavirus disease (COVID-19) 12/19/2019   hospital admission w/ covid pneumonia , no intubatiion,  per pt symptoms resolved and back to baseline   History of cerebrovascular accident (CVA) with residual deficit    CVA in 2000  without residuals;  CVA post op AVR 01/ 2016 with residual right hand weakness   History of gastritis    History of pneumothorax    s/p right vats 11-19-2014 and  s/p left vats  12-13-2015   (both spontenaous due to bleb)   Hyperlipidemia    Hypertension    Hyperthyroidism    per pt followed by pcp,  dx approx 2014   Malignant neoplasm of upper-outer quadrant of left breast in female, 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   oncologist-- dr Mosetta Putt--- dx 10/ 2018  ,  s/p left lumptectomy with node dissection;  completed radiation 04-15-2017, no chemo   Multiple thyroid nodules    in care everywhere pt had left thyroid nodule biospy 09-18-2014 benign   NICM (nonischemic cardiomyopathy) (HCC) 2016   2016  ef 30%;  last echo in epic ef 45%   OA (osteoarthritis)    Osteoporosis    Paroxysmal atrial fibrillation [facility])    cardiologist--- dr end   Personal history of radiation therapy    left breast cancer  03-08-2017  to 04-05-2017   Thrombocytopenia [facility])    followed by dr Mosetta Putt   Valvular stenosis, aortic 03/12/2014   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   Wears dentures    full upper    SURGICAL HISTORY: Past Surgical History:  Procedure Laterality Date   AORTIC VALVE REPLACEMENT  03/12/2014   [facility] [place];  St Jude 23mm, medial Trifecta Bioprosthesis   BREAST LUMPECTOMY WITH RADIOACTIVE SEED AND SENTINEL LYMPH NODE BIOPSY Left 01/14/2017   Procedure: LEFT BREAST LUMPECTOMY WITH RADIOACTIVE SEED AND SENTINEL LYMPH NODE BIOPSY;  Surgeon: [name], MD;  Location: [facility] OR;  Service: General;  Laterality: Left;   CARDIAC CATHETERIZATION  02/12/2014   [facility] in [place]   HYSTEROSCOPY WITH D & C N/A 11/17/2020   Procedure: DILATATION AND CURETTAGE /HYSTEROSCOPY WITH MYOSURE;  Surgeon: [name], MD;  Location: [facility];  Service: Gynecology;  Laterality: N/A;   INTRAMEDULLARY (IM) NAIL INTERTROCHANTERIC  Left 12/10/2015   Procedure: INTRAMEDULLARY (IM) NAIL INTERTROCHANTRIC;  Surgeon: Tarry Kos, MD;  Location: [facility] OR;  Service: Orthopedics;  Laterality: Left;   OPERATIVE ULTRASOUND N/A 11/17/2020   Procedure: OPERATIVE ULTRASOUND;  Surgeon: [name], MD;  Location: [facility];  Service: Gynecology;  Laterality: N/A;   PLEURADESIS Left 12/03/2015   Procedure: PLEURADESIS;  Surgeon: Loreli Slot, MD;  Location: [facility] OR;  Service: Thoracic;  Laterality: Left;   RESECTION OF APICAL BLEB Left 12/03/2015   Procedure: BLEBECTOMY;  Surgeon: Loreli Slot, MD;  Location: [facility] OR;  Service: Thoracic;  Laterality: Left;   RIGHT/LEFT HEART CATH AND CORONARY ANGIOGRAPHY N/A 03/10/2020   Procedure: RIGHT/LEFT HEART CATH AND CORONARY ANGIOGRAPHY;  Surgeon: [name], MD;  Location: [facility] INVASIVE CV LAB;  Service: Cardiovascular;  Laterality: N/A;   VIDEO ASSISTED THORACOSCOPY Left 12/03/2015   Procedure: VIDEO ASSISTED THORACOSCOPY;  Surgeon: Loreli Slot, MD;  Location: [facility] OR;  Service: Thoracic;  Laterality: Left;   VIDEO ASSISTED THORACOSCOPY (VATS) W/TALC PLEUADESIS Right 11/19/2014   in [place]    I have reviewed the social history and family history with the patient and they are unchanged from previous note.  ALLERGIES:  is allergic to lisinopril, tetanus toxoid adsorbed, other, and peanut allergen powder-dnfp.  MEDICATIONS:  Current Outpatient Medications  Medication Sig Dispense Refill   eltrombopag (PROMACTA) 25 MG tablet Take 1 tablet (25 mg total) by mouth daily. Take on an empty stomach, 1 hour before a meal or 2 hours after. 30 tablet 3   albuterol (PROVENTIL HFA;VENTOLIN HFA) 108 (90 Base) MCG/ACT inhaler Inhale 2 puffs into the lungs every 6 (six) hours as needed for wheezing or shortness of breath.     apixaban (ELIQUIS) 5 MG TABS tablet Take 1 tablet (5 mg total) by mouth 2 (two) times daily. 180 tablet 1   ascorbic acid  (VITAMIN C) 500 MG tablet Take 500 mg by mouth daily.     carvedilol (COREG) 25 MG tablet Take 1 tablet (25 mg total) by mouth 2 (two) times daily with a meal. 180 tablet 3   Cholecalciferol 25 MCG (1000 UT) tablet Take 1,000 Units by mouth daily.     cyclobenzaprine (FLEXERIL) 5 MG tablet Take 1 tablet (5 mg total) by mouth 2 (two) times daily as needed for muscle spasms. 20 tablet 0   fluticasone (FLONASE) 50 MCG/ACT nasal spray Place 1 spray into both nostrils daily. 16 g 3   methimazole (TAPAZOLE) 5 MG tablet Take 3 tablets (15 mg total) by mouth daily. 270 tablet 3   Multiple Vitamin (MULTIVITAMIN) tablet Take 1 tablet by mouth daily.     omeprazole (PRILOSEC) 40 MG capsule Take 1 capsule (40 mg total) by mouth daily. 60 capsule 0   rosuvastatin (CRESTOR) 40 MG tablet Take 1 tablet (40 mg total) by mouth daily. 90 tablet 3   sacubitril-valsartan (ENTRESTO) 49-51 MG Take 1 tablet by mouth 2 (two) times daily. 60 tablet 4   senna-docusate (SENOKOT-S) 8.6-50 MG tablet Take 2 tablets by mouth at bedtime.     spironolactone (ALDACTONE) 25 MG tablet Take 1 tablet (25 mg total) by mouth daily. 90 tablet 3   torsemide (DEMADEX) 20 MG tablet Take 1 tablet (20 mg total) by mouth daily.     traMADol (ULTRAM) 50 MG tablet Take 1 tablet (50 mg total) by mouth 3 (three) times daily. 90 tablet 0   trolamine salicylate (ASPERCREME) 10 % cream Apply 1 application topically as needed for muscle pain.     valACYclovir (VALTREX) 500 MG tablet Take on tablet by mouth twice a day for 3 days as needed for an outbreak 30 tablet 0   No current facility-administered medications for this visit.    PHYSICAL EXAMINATION: ECOG PERFORMANCE STATUS: 1 - Symptomatic but completely ambulatory  Vitals:   11/18/22 1035  BP: 135/74  Pulse: 89  Resp: 18  Temp: 98.4 F (36.9 C)  SpO2: 98%   Wt Readings from Last 3 Encounters:  11/18/22 243 lb 14.4 oz (110.6 kg)  07/30/22 247 lb 9.6 oz (112.3 kg)  06/04/22 235 lb  (106.6 kg)     GENERAL:alert, no distress and comfortable SKIN: skin color, texture, turgor are normal, no rashes or significant lesions EYES: normal, Conjunctiva are pink and non-injected, sclera clear NECK: supple, thyroid normal size, non-tender, without nodularity LYMPH:  no palpable lymphadenopathy in  the cervical, axillary  LUNGS: clear to auscultation and percussion with normal breathing effort HEART: regular rate & rhythm and no murmurs and no lower extremity edema ABDOMEN:abdomen soft, non-tender and normal bowel sounds Musculoskeletal:no cyanosis of digits and no clubbing  NEURO: alert & oriented x 3 with fluent speech, no focal motor/sensory deficits BREAST: No tenderness or lumps in either breast. No enlarged lymph nodes in axillae.      LABORATORY DATA:  I have reviewed the data as listed    Latest Ref Rng & Units 11/18/2022    9:53 AM 05/20/2022    9:19 AM 11/19/2021    9:37 AM  CBC  WBC 4.0 - 10.5 K/uL 5.4  5.0  5.0   Hemoglobin 12.0 - 15.0 g/dL 16.1  09.6  04.5   Hematocrit 36.0 - 46.0 % 42.2  39.8  36.3   Platelets 150 - 400 K/uL 50  50  58         Latest Ref Rng & Units 11/18/2022    9:53 AM 08/06/2022   12:47 PM 07/30/2022   11:30 AM  CMP  Glucose 70 - 99 mg/dL 409  811  96   BUN 8 - 23 mg/dL 15  37  16   Creatinine 0.44 - 1.00 mg/dL 9.14  7.82  9.56   Sodium 135 - 145 mmol/L 139  137  140   Potassium 3.5 - 5.1 mmol/L 5.0  4.6  4.5   Chloride 98 - 111 mmol/L 108  102  106   CO2 22 - 32 mmol/L 27  25  19    Calcium 8.9 - 10.3 mg/dL 21.3  08.6  57.8   Total Protein 6.5 - 8.1 g/dL 7.4     Total Bilirubin 0.3 - 1.2 mg/dL 0.8     Alkaline Phos 38 - 126 U/L 77     AST 15 - 41 U/L 18     ALT 0 - 44 U/L 14         RADIOGRAPHIC STUDIES: I have personally reviewed the radiological images as listed and agreed with the findings in the report. No results found.    Orders Placed This Encounter  Procedures   MM Digital Screening    Standing Status:   Future     Standing Expiration Date:   11/18/2023    Order Specific Question:   Reason for Exam (SYMPTOM  OR DIAGNOSIS REQUIRED)    Answer:   SCREENING    Order Specific Question:   Preferred imaging location?    Answer:   [facility]   All questions were answered. The patient knows to call the clinic with any problems, questions or concerns. No barriers to learning was detected. The total time spent in the appointment was 30 minutes.     Malachy Mood, MD 11/18/2022

## 2022-11-22 ENCOUNTER — Other Ambulatory Visit (HOSPITAL_COMMUNITY): Payer: Self-pay

## 2022-11-22 ENCOUNTER — Telehealth: Payer: Self-pay | Admitting: Pharmacist

## 2022-11-22 ENCOUNTER — Telehealth: Payer: Self-pay | Admitting: Pharmacy Technician

## 2022-11-22 DIAGNOSIS — D696 Thrombocytopenia, unspecified: Secondary | ICD-10-CM

## 2022-11-22 MED ORDER — ELTROMBOPAG OLAMINE 25 MG PO TABS
25.0000 mg | ORAL_TABLET | Freq: Every day | ORAL | 3 refills | Status: DC
Start: 2022-11-22 — End: 2023-04-04
  Filled 2022-12-09: qty 30, 30d supply, fill #0
  Filled 2023-01-03: qty 30, 30d supply, fill #1
  Filled 2023-02-16: qty 30, 30d supply, fill #2

## 2022-11-22 NOTE — Telephone Encounter (Signed)
Oral Oncology Patient Advocate Encounter   Received notification that prior authorization for Promacta is required.   PA submitted on 11/22/22 Key [id number] Status is pending     Jinger Neighbors, CPhT-Adv Oncology Pharmacy Patient Advocate [facility] Direct Number: [phone]  Fax: [phone]

## 2022-11-22 NOTE — Telephone Encounter (Signed)
Clinical Pharmacist Practitioner Encounter   Received new prescription for Promacta (eltrombopag) for the treatment of ITP, planned duration until disease progression or unacceptable drug toxicity.  CMP/CBC from 11/18/22 assessed, no relevant lab abnormalities. Prescription dose and frequency assessed. MD starting patient on a reduced dose.   Current medication list in Epic reviewed, a few DDIs with eltrombopag identified: Multivitamin: polyvalent cation containing products such as multivitamins may decrease the serum concentration of eltrombopag. Administer eltrombopag at least 2 hours before or 4 hours after the multivitamin.  Rosuvastatin: Eltrombopag may increase the serum concentration of Rosuvastatin. Monitor for signs and symptoms of rosuvastatin toxicity (eg, AST and/or ALT elevation, myalgia) closely when using rosuvastatin together with eltrombopag.  Consider reducing the dose of rosuvastatin if necessary. According to eltrombopag prescribing information, a 50% reduction in rosuvastatin dose was recommended in clinical trials. MD staff messaged on 9/23 about above drug interaction; 9/24 MD aware of interaction  Evaluated chart and no patient barriers to medication adherence identified.   Prescription has been e-scribed to the [facility] for benefits analysis and approval.  Oral Oncology Clinic will continue to follow for insurance authorization, copayment issues, initial counseling and start date.   Remi Haggard, PharmD, BCPS, BCOP, CPP Hematology/Oncology Clinical Pharmacist Practitioner [facility] [phone]  11/22/2022 12:04 PM

## 2022-11-22 NOTE — Telephone Encounter (Signed)
Oral Oncology Patient Advocate Encounter  Clinical questions for PA requested via phone [phone]  PA Case ID: [id number]  PA is pending  Jinger Neighbors, CPhT-Adv Oncology Pharmacy Patient Advocate [facility] Direct Number: [phone]  Fax: [phone]

## 2022-11-24 ENCOUNTER — Telehealth: Payer: Self-pay | Admitting: Pharmacy Technician

## 2022-11-24 NOTE — Telephone Encounter (Signed)
Oral Oncology Patient Advocate Encounter   Submitted PA clinical responses to [facility].   Application submitted via e-fax to [phone]   I will continue to check the status until final determination.   Jinger Neighbors, CPhT-Adv Oncology Pharmacy Patient Advocate [facility] Direct Number: [phone]  Fax: [phone]

## 2022-11-24 NOTE — Telephone Encounter (Signed)
Oral Oncology Patient Advocate Encounter  Received notification that the request for prior authorization for Promacta has been denied due to patient does not have relapsed/refractory ITP.     An urgent appeal for the prior authorization denial of Danielle [name] has been started by the pharmacist on 11/24/22.   This encounter will continue to be updated until final appeal determination.      Jinger Neighbors, CPhT-Adv Oncology Pharmacy Patient Advocate [facility] Direct Number: [phone]  Fax: [phone]

## 2022-11-24 NOTE — Telephone Encounter (Signed)
Oral Oncology Patient Advocate Encounter   Began application for assistance for Promacta through [facility].   Application will be submitted upon completion of necessary supporting documentation. Pending POI.   [facility] phone number [phone].   I will continue to check the status until final determination.   Jinger Neighbors, CPhT-Adv Oncology Pharmacy Patient Advocate [facility] Direct Number: [phone]  Fax: [phone]

## 2022-11-24 NOTE — Telephone Encounter (Signed)
Oral Oncology Patient Advocate Encounter   Submitted HCP forms to [facility].   Application submitted via e-fax to [phone]   [facility] phone number [phone].   Application is still pending POI. I will continue to check the status until final determination.   Jinger Neighbors, CPhT-Adv Oncology Pharmacy Patient Advocate [facility] Direct Number: [phone]  Fax: [phone]

## 2022-11-25 ENCOUNTER — Other Ambulatory Visit (HOSPITAL_COMMUNITY): Payer: Self-pay

## 2022-11-25 NOTE — Telephone Encounter (Signed)
Oral Oncology Patient Advocate Encounter  Prior Authorization for Danielle [name] has been approved.    PA# [id number] Effective dates: 11/25/22 through 05/25/23  Patients co-pay is $11.20.    Jinger Neighbors, CPhT-Adv Oncology Pharmacy Patient Advocate [facility] Direct Number: [phone]  Fax: [phone]

## 2022-11-25 NOTE — Telephone Encounter (Signed)
Oral Oncology Patient Advocate Encounter  PA for [name] has been approved with copay of $11.20. Case for PAP will be closed until further notice or need is determined.  Jinger Neighbors, CPhT-Adv Oncology Pharmacy Patient Advocate [facility] Direct Number: [phone]  Fax: [phone]

## 2022-11-25 NOTE — Telephone Encounter (Signed)
Oral Oncology Patient Advocate Encounter  Submitted signed appeal request form to [facility] Department via e-fax [phone].  Jinger Neighbors, CPhT-Adv Oncology Pharmacy Patient Advocate [facility] Direct Number: [phone]  Fax: [phone]

## 2022-12-01 ENCOUNTER — Telehealth: Payer: Self-pay | Admitting: Pharmacist

## 2022-12-01 NOTE — Telephone Encounter (Signed)
Oral Chemotherapy Pharmacist Encounter   MD notified that oral chemotherapy team has been unable to reach patient via phone to set up Promacta fill. Letter being mailed to patient's home with contact information to reach out to oral chemotherapy clinic to set up fill.   We will sign off at this time until patient reaches back out.   Lenord Carbo, PharmD, BCPS, [facility] Hematology/Oncology Clinical Pharmacist [facility] Oral Chemotherapy Navigation Clinics [phone] 12/01/2022 8:50 AM

## 2022-12-01 NOTE — Telephone Encounter (Signed)
Oral Oncology Patient Advocate Encounter  Attempted to contact patient to set up first fill of Promacta but was unable to make contact.  Call attempt #1 11/25/22 Call attempt #2 11/26/22 Call attempt #3 11/30/22  MyChart message was also sent in attempt to contact patient.   Phone number on file for patient's spouse states it is invalid when dialed.  Jinger Neighbors, CPhT-Adv Oncology Pharmacy Patient Advocate [facility] Direct Number: [phone]  Fax: [phone]

## 2022-12-09 ENCOUNTER — Telehealth: Payer: Self-pay | Admitting: Pharmacist

## 2022-12-09 ENCOUNTER — Other Ambulatory Visit: Payer: Self-pay

## 2022-12-09 ENCOUNTER — Other Ambulatory Visit: Payer: Self-pay | Admitting: Pharmacy Technician

## 2022-12-09 NOTE — Telephone Encounter (Signed)
Oral Chemotherapy Pharmacist Encounter  I spoke with patient for overview of: Promacta (eltrombopag) for the treatment of ITP, planned duration until disease progression or unacceptable toxicity.   Counseled patient on administration, dosing, side effects, monitoring, drug-food interactions, safe handling, storage, and disposal.  Patient will take Promacta 25mg  tablets, 1 tablet by mouth once daily on an empty stomach, 1 hour before or 2 hours after a meal.  Patient counseled to administer Promacta at least 2 hours before, or 4 hours after antacids, foods high in calcium, or vitamin supplements (iron, calcium, aluminum, magnesium, selinium, zinc). Patient agreed to taking her multivitamin supplements at night time so that it is separated from [place] administration.   Promacta start date: 12/11/22  Adverse effects include but are not limited to: fatigue, diarrhea, nausea, hepatotoxicity, myalgias.    Reviewed with patient importance of keeping a medication schedule and plan for any missed doses. No barriers to medication adherence identified.  Medication reconciliation performed and medication/allergy list updated. Patient educated about drug-drug interaction between Promacta and Rosuvastatin. Patient knows to monitor for increased myalgias or change in urine color ([employer] colored) and to alert office of any changes. Patient expressed understanding.   All questions answered.  Ms. Carr voiced understanding and appreciation.   Medication education handout placed in mail for patient. Patient knows to call the office with questions or concerns. Oral Chemotherapy Clinic phone number provided to patient.   Lenord Carbo, PharmD, BCPS, BCOP Hematology/Oncology Clinical Pharmacist [facility] Oral Chemotherapy Navigation Clinics [phone] 12/09/2022 10:40 AM

## 2022-12-09 NOTE — Progress Notes (Signed)
Specialty Pharmacy Initial Fill Coordination Note  Danielle [name] is a 77 y.o. female contacted today regarding refills of specialty medication(s) Eltrombopag Olamine .  Patient requested Delivery  on 12/10/22  to verified address [address]   [place] [address]   Medication will be filled on 12/09/22.   Patient is aware of $11.20 copayment.

## 2022-12-09 NOTE — Progress Notes (Signed)
Oral Chemotherapy Pharmacist Encounter  Patient was counseled under telephone encounter from 12/09/22.  Lenord Carbo, PharmD, BCPS, BCOP Hematology/Oncology Clinical Pharmacist [facility] Oral Chemotherapy Navigation Clinics [phone] 12/09/2022 10:47 AM

## 2022-12-20 ENCOUNTER — Telehealth: Payer: Self-pay | Admitting: Hematology

## 2022-12-22 ENCOUNTER — Inpatient Hospital Stay: Payer: Medicare HMO

## 2022-12-24 ENCOUNTER — Other Ambulatory Visit: Payer: Medicare HMO

## 2022-12-25 ENCOUNTER — Inpatient Hospital Stay (HOSPITAL_COMMUNITY)
Admission: EM | Admit: 2022-12-25 | Discharge: 2023-01-19 | DRG: 871 | Disposition: A | Discharge status: Skilled Nursing Facility | Payer: Medicare HMO | Attending: Internal Medicine | Admitting: Internal Medicine

## 2022-12-25 ENCOUNTER — Emergency Department (HOSPITAL_COMMUNITY): Payer: Medicare HMO

## 2022-12-25 ENCOUNTER — Other Ambulatory Visit: Payer: Self-pay

## 2022-12-25 ENCOUNTER — Encounter (HOSPITAL_COMMUNITY): Payer: Self-pay | Admitting: Internal Medicine

## 2022-12-25 ENCOUNTER — Inpatient Hospital Stay (HOSPITAL_COMMUNITY): Payer: Medicare HMO

## 2022-12-25 ENCOUNTER — Other Ambulatory Visit (HOSPITAL_COMMUNITY): Payer: Self-pay

## 2022-12-25 DIAGNOSIS — R6510 Systemic inflammatory response syndrome (SIRS) of non-infectious origin without acute organ dysfunction: Secondary | ICD-10-CM | POA: Diagnosis not present

## 2022-12-25 DIAGNOSIS — R0602 Shortness of breath: Secondary | ICD-10-CM | POA: Diagnosis present

## 2022-12-25 DIAGNOSIS — Z66 Do not resuscitate: Secondary | ICD-10-CM | POA: Diagnosis present

## 2022-12-25 DIAGNOSIS — I5021 Acute systolic (congestive) heart failure: Secondary | ICD-10-CM | POA: Diagnosis not present

## 2022-12-25 DIAGNOSIS — I428 Other cardiomyopathies: Secondary | ICD-10-CM | POA: Diagnosis present

## 2022-12-25 DIAGNOSIS — E0590 Thyrotoxicosis, unspecified without thyrotoxic crisis or storm: Secondary | ICD-10-CM | POA: Diagnosis present

## 2022-12-25 DIAGNOSIS — I130 Hypertensive heart and chronic kidney disease with heart failure and stage 1 through stage 4 chronic kidney disease, or unspecified chronic kidney disease: Secondary | ICD-10-CM | POA: Diagnosis present

## 2022-12-25 DIAGNOSIS — Z7901 Long term (current) use of anticoagulants: Secondary | ICD-10-CM

## 2022-12-25 DIAGNOSIS — I2489 Other forms of acute ischemic heart disease: Secondary | ICD-10-CM | POA: Diagnosis present

## 2022-12-25 DIAGNOSIS — Z1152 Encounter for screening for COVID-19: Secondary | ICD-10-CM | POA: Diagnosis not present

## 2022-12-25 DIAGNOSIS — E785 Hyperlipidemia, unspecified: Secondary | ICD-10-CM | POA: Diagnosis present

## 2022-12-25 DIAGNOSIS — I480 Paroxysmal atrial fibrillation: Secondary | ICD-10-CM | POA: Diagnosis present

## 2022-12-25 DIAGNOSIS — Z79899 Other long term (current) drug therapy: Secondary | ICD-10-CM

## 2022-12-25 DIAGNOSIS — R112 Nausea with vomiting, unspecified: Secondary | ICD-10-CM | POA: Diagnosis not present

## 2022-12-25 DIAGNOSIS — N1832 Chronic kidney disease, stage 3b: Secondary | ICD-10-CM | POA: Diagnosis present

## 2022-12-25 DIAGNOSIS — Z23 Encounter for immunization: Secondary | ICD-10-CM

## 2022-12-25 DIAGNOSIS — Z87891 Personal history of nicotine dependence: Secondary | ICD-10-CM

## 2022-12-25 DIAGNOSIS — I700 Atherosclerosis of aorta: Secondary | ICD-10-CM | POA: Diagnosis present

## 2022-12-25 DIAGNOSIS — R7989 Other specified abnormal findings of blood chemistry: Secondary | ICD-10-CM | POA: Diagnosis present

## 2022-12-25 DIAGNOSIS — M810 Age-related osteoporosis without current pathological fracture: Secondary | ICD-10-CM | POA: Diagnosis present

## 2022-12-25 DIAGNOSIS — Z8616 Personal history of COVID-19: Secondary | ICD-10-CM | POA: Diagnosis not present

## 2022-12-25 DIAGNOSIS — N189 Chronic kidney disease, unspecified: Secondary | ICD-10-CM | POA: Diagnosis present

## 2022-12-25 DIAGNOSIS — E669 Obesity, unspecified: Secondary | ICD-10-CM | POA: Diagnosis not present

## 2022-12-25 DIAGNOSIS — I69331 Monoplegia of upper limb following cerebral infarction affecting right dominant side: Secondary | ICD-10-CM

## 2022-12-25 DIAGNOSIS — Z8249 Family history of ischemic heart disease and other diseases of the circulatory system: Secondary | ICD-10-CM

## 2022-12-25 DIAGNOSIS — R197 Diarrhea, unspecified: Secondary | ICD-10-CM | POA: Diagnosis present

## 2022-12-25 DIAGNOSIS — U071 COVID-19: Secondary | ICD-10-CM | POA: Diagnosis not present

## 2022-12-25 DIAGNOSIS — J440 Chronic obstructive pulmonary disease with acute lower respiratory infection: Secondary | ICD-10-CM | POA: Diagnosis present

## 2022-12-25 DIAGNOSIS — Z953 Presence of xenogenic heart valve: Secondary | ICD-10-CM | POA: Diagnosis not present

## 2022-12-25 DIAGNOSIS — Z6839 Body mass index (BMI) 39.0-39.9, adult: Secondary | ICD-10-CM | POA: Diagnosis not present

## 2022-12-25 DIAGNOSIS — Z923 Personal history of irradiation: Secondary | ICD-10-CM

## 2022-12-25 DIAGNOSIS — Z853 Personal history of malignant neoplasm of breast: Secondary | ICD-10-CM

## 2022-12-25 DIAGNOSIS — J9601 Acute respiratory failure with hypoxia: Principal | ICD-10-CM | POA: Diagnosis present

## 2022-12-25 DIAGNOSIS — Z888 Allergy status to other drugs, medicaments and biological substances status: Secondary | ICD-10-CM

## 2022-12-25 DIAGNOSIS — I2699 Other pulmonary embolism without acute cor pulmonale: Secondary | ICD-10-CM | POA: Diagnosis present

## 2022-12-25 DIAGNOSIS — J441 Chronic obstructive pulmonary disease with (acute) exacerbation: Secondary | ICD-10-CM | POA: Diagnosis present

## 2022-12-25 DIAGNOSIS — E039 Hypothyroidism, unspecified: Secondary | ICD-10-CM | POA: Diagnosis present

## 2022-12-25 DIAGNOSIS — N179 Acute kidney failure, unspecified: Secondary | ICD-10-CM | POA: Diagnosis present

## 2022-12-25 DIAGNOSIS — I10 Essential (primary) hypertension: Secondary | ICD-10-CM | POA: Diagnosis present

## 2022-12-25 DIAGNOSIS — D6959 Other secondary thrombocytopenia: Secondary | ICD-10-CM | POA: Diagnosis present

## 2022-12-25 DIAGNOSIS — R6520 Severe sepsis without septic shock: Secondary | ICD-10-CM | POA: Diagnosis present

## 2022-12-25 DIAGNOSIS — I959 Hypotension, unspecified: Secondary | ICD-10-CM | POA: Diagnosis present

## 2022-12-25 DIAGNOSIS — Z833 Family history of diabetes mellitus: Secondary | ICD-10-CM

## 2022-12-25 DIAGNOSIS — I5022 Chronic systolic (congestive) heart failure: Secondary | ICD-10-CM | POA: Diagnosis present

## 2022-12-25 DIAGNOSIS — I82433 Acute embolism and thrombosis of popliteal vein, bilateral: Secondary | ICD-10-CM | POA: Diagnosis present

## 2022-12-25 DIAGNOSIS — A419 Sepsis, unspecified organism: Principal | ICD-10-CM | POA: Diagnosis present

## 2022-12-25 DIAGNOSIS — Z86711 Personal history of pulmonary embolism: Secondary | ICD-10-CM | POA: Diagnosis not present

## 2022-12-25 DIAGNOSIS — K219 Gastro-esophageal reflux disease without esophagitis: Secondary | ICD-10-CM | POA: Diagnosis present

## 2022-12-25 DIAGNOSIS — Z801 Family history of malignant neoplasm of trachea, bronchus and lung: Secondary | ICD-10-CM

## 2022-12-25 DIAGNOSIS — M1712 Unilateral primary osteoarthritis, left knee: Secondary | ICD-10-CM | POA: Diagnosis present

## 2022-12-25 DIAGNOSIS — I82453 Acute embolism and thrombosis of peroneal vein, bilateral: Secondary | ICD-10-CM | POA: Diagnosis present

## 2022-12-25 DIAGNOSIS — Z8349 Family history of other endocrine, nutritional and metabolic diseases: Secondary | ICD-10-CM

## 2022-12-25 DIAGNOSIS — I82443 Acute embolism and thrombosis of tibial vein, bilateral: Secondary | ICD-10-CM | POA: Diagnosis present

## 2022-12-25 DIAGNOSIS — Z887 Allergy status to serum and vaccine status: Secondary | ICD-10-CM

## 2022-12-25 HISTORY — DX: Nausea with vomiting, unspecified: R11.2

## 2022-12-25 LAB — I-STAT CHEM 8, ED
BUN: 33 mg/dL — ABNORMAL HIGH (ref 8–23)
Calcium, Ion: 1.28 mmol/L (ref 1.15–1.40)
Chloride: 104 mmol/L (ref 98–111)
Creatinine, Ser: 3.4 mg/dL — ABNORMAL HIGH (ref 0.44–1.00)
Glucose, Bld: 141 mg/dL — ABNORMAL HIGH (ref 70–99)
HCT: 45 % (ref 36.0–46.0)
Hemoglobin: 15.3 g/dL — ABNORMAL HIGH (ref 12.0–15.0)
Potassium: 4.4 mmol/L (ref 3.5–5.1)
Sodium: 139 mmol/L (ref 135–145)
TCO2: 22 mmol/L (ref 22–32)

## 2022-12-25 LAB — CBC WITH DIFFERENTIAL/PLATELET
Abs Immature Granulocytes: 0.04 10*3/uL (ref 0.00–0.07)
Basophils Absolute: 0 10*3/uL (ref 0.0–0.1)
Basophils Relative: 0 %
Eosinophils Absolute: 0 10*3/uL (ref 0.0–0.5)
Eosinophils Relative: 0 %
HCT: 44.1 % (ref 36.0–46.0)
Hemoglobin: 14.4 g/dL (ref 12.0–15.0)
Immature Granulocytes: 0 %
Lymphocytes Relative: 13 %
Lymphs Abs: 1.2 10*3/uL (ref 0.7–4.0)
MCH: 30.6 pg (ref 26.0–34.0)
MCHC: 32.7 g/dL (ref 30.0–36.0)
MCV: 93.8 fL (ref 80.0–100.0)
Monocytes Absolute: 0.5 10*3/uL (ref 0.1–1.0)
Monocytes Relative: 6 %
Neutro Abs: 7.3 10*3/uL (ref 1.7–7.7)
Neutrophils Relative %: 81 %
Platelets: 68 10*3/uL — ABNORMAL LOW (ref 150–400)
RBC: 4.7 MIL/uL (ref 3.87–5.11)
RDW: 13.2 % (ref 11.5–15.5)
WBC: 9.1 10*3/uL (ref 4.0–10.5)
nRBC: 0 % (ref 0.0–0.2)

## 2022-12-25 LAB — COMPREHENSIVE METABOLIC PANEL
ALT: 13 U/L (ref 0–44)
AST: 17 U/L (ref 15–41)
Albumin: 3.6 g/dL (ref 3.5–5.0)
Alkaline Phosphatase: 79 U/L (ref 38–126)
Anion gap: 15 (ref 5–15)
BUN: 31 mg/dL — ABNORMAL HIGH (ref 8–23)
CO2: 20 mmol/L — ABNORMAL LOW (ref 22–32)
Calcium: 10.2 mg/dL (ref 8.9–10.3)
Chloride: 103 mmol/L (ref 98–111)
Creatinine, Ser: 3.13 mg/dL — ABNORMAL HIGH (ref 0.44–1.00)
GFR, Estimated: 15 mL/min — ABNORMAL LOW (ref >60)
Glucose, Bld: 140 mg/dL — ABNORMAL HIGH (ref 70–99)
Potassium: 4.4 mmol/L (ref 3.5–5.1)
Sodium: 138 mmol/L (ref 135–145)
Total Bilirubin: 0.6 mg/dL (ref 0.3–1.2)
Total Protein: 7.2 g/dL (ref 6.5–8.1)

## 2022-12-25 LAB — CK: Total CK: 43 U/L (ref 38–234)

## 2022-12-25 LAB — POC OCCULT BLOOD, ED: Fecal Occult Bld: NEGATIVE

## 2022-12-25 LAB — TSH: TSH: 0.649 u[IU]/mL (ref 0.350–4.500)

## 2022-12-25 LAB — PROCALCITONIN: Procalcitonin: 0.1 ng/mL

## 2022-12-25 LAB — TROPONIN I (HIGH SENSITIVITY)
Troponin I (High Sensitivity): 226 ng/L (ref <18)
Troponin I (High Sensitivity): 288 ng/L (ref <18)

## 2022-12-25 LAB — RESP PANEL BY RT-PCR (RSV, FLU A&B, COVID)  RVPGX2
Influenza A by PCR: NEGATIVE
Influenza B by PCR: NEGATIVE
Resp Syncytial Virus by PCR: NEGATIVE
SARS Coronavirus 2 by RT PCR: NEGATIVE

## 2022-12-25 LAB — APTT
aPTT: 33 s (ref 24–36)
aPTT: >200 s (ref 24–36)

## 2022-12-25 LAB — T4, FREE: Free T4: 1.07 ng/dL (ref 0.61–1.12)

## 2022-12-25 LAB — BRAIN NATRIURETIC PEPTIDE: B Natriuretic Peptide: 739.8 pg/mL — ABNORMAL HIGH (ref 0.0–100.0)

## 2022-12-25 LAB — HEPARIN LEVEL (UNFRACTIONATED): Heparin Unfractionated: >1.1 [IU]/mL — ABNORMAL HIGH (ref 0.30–0.70)

## 2022-12-25 LAB — PROTIME-INR
INR: 1.1 (ref 0.8–1.2)
Prothrombin Time: 14.7 s (ref 11.4–15.2)

## 2022-12-25 LAB — I-STAT CG4 LACTIC ACID, ED: Lactic Acid, Venous: 1.8 mmol/L (ref 0.5–1.9)

## 2022-12-25 MED ORDER — AZITHROMYCIN 500 MG IV SOLR
500.0000 mg | Freq: Once | INTRAVENOUS | Status: AC
  Administered 2022-12-25: 500 mg via INTRAVENOUS
  Filled 2022-12-25: qty 5

## 2022-12-25 MED ORDER — HEPARIN BOLUS VIA INFUSION
2000.0000 [IU] | Freq: Once | INTRAVENOUS | Status: AC
Start: 2022-12-25 — End: 2022-12-25
  Administered 2022-12-25: 2000 [IU] via INTRAVENOUS
  Filled 2022-12-25: qty 2000

## 2022-12-25 MED ORDER — LORAZEPAM 2 MG/ML IJ SOLN
0.5000 mg | INTRAMUSCULAR | Status: AC | PRN
Start: 2022-12-25 — End: 2022-12-30
  Administered 2022-12-25 – 2022-12-30 (×3): 0.5 mg via INTRAVENOUS
  Filled 2022-12-25 (×3): qty 1

## 2022-12-25 MED ORDER — SODIUM CHLORIDE 0.9 % IV SOLN
2.0000 g | INTRAVENOUS | Status: DC
  Administered 2022-12-26 – 2022-12-27 (×2): 2 g via INTRAVENOUS
  Filled 2022-12-25 (×2): qty 20

## 2022-12-25 MED ORDER — HEPARIN (PORCINE) 25000 UT/250ML-% IV SOLN
1350.0000 [IU]/h | INTRAVENOUS | Status: DC
  Administered 2022-12-26: 1350 [IU]/h via INTRAVENOUS
  Filled 2022-12-25: qty 250

## 2022-12-25 MED ORDER — SODIUM CHLORIDE 0.9% FLUSH
3.0000 mL | Freq: Two times a day (BID) | INTRAVENOUS | Status: DC
  Administered 2022-12-25 – 2023-01-12 (×35): 3 mL via INTRAVENOUS

## 2022-12-25 MED ORDER — ONDANSETRON HCL 4 MG/2ML IJ SOLN
4.0000 mg | Freq: Four times a day (QID) | INTRAMUSCULAR | Status: DC | PRN
  Administered 2022-12-25: 4 mg via INTRAVENOUS
  Filled 2022-12-25: qty 2

## 2022-12-25 MED ORDER — ONDANSETRON HCL 4 MG PO TABS
4.0000 mg | ORAL_TABLET | Freq: Four times a day (QID) | ORAL | Status: DC | PRN
  Administered 2023-01-03 – 2023-01-07 (×2): 4 mg via ORAL
  Filled 2022-12-25 (×2): qty 1

## 2022-12-25 MED ORDER — VANCOMYCIN HCL 2000 MG/400ML IV SOLN
2000.0000 mg | Freq: Once | INTRAVENOUS | Status: AC
  Administered 2022-12-25: 2000 mg via INTRAVENOUS
  Filled 2022-12-25: qty 400

## 2022-12-25 MED ORDER — BUDESONIDE 0.5 MG/2ML IN SUSP
0.5000 mg | Freq: Two times a day (BID) | RESPIRATORY_TRACT | Status: DC
  Administered 2022-12-25 – 2023-01-19 (×46): 0.5 mg via RESPIRATORY_TRACT
  Filled 2022-12-25 (×51): qty 2

## 2022-12-25 MED ORDER — ACETAMINOPHEN 650 MG RE SUPP: 650.0000 mg | Freq: Four times a day (QID) | RECTAL | Status: DC | PRN

## 2022-12-25 MED ORDER — PREDNISONE 20 MG PO TABS
40.0000 mg | ORAL_TABLET | Freq: Every day | ORAL | Status: DC
  Administered 2022-12-26 – 2022-12-27 (×2): 40 mg via ORAL
  Filled 2022-12-25 (×2): qty 2

## 2022-12-25 MED ORDER — ALBUTEROL SULFATE (2.5 MG/3ML) 0.083% IN NEBU
2.5000 mg | INHALATION_SOLUTION | RESPIRATORY_TRACT | Status: DC | PRN
  Administered 2023-01-09: 2.5 mg via RESPIRATORY_TRACT
  Filled 2022-12-25: qty 3

## 2022-12-25 MED ORDER — SODIUM CHLORIDE 0.9 % IV SOLN
1.0000 g | Freq: Once | INTRAVENOUS | Status: AC
  Administered 2022-12-25: 1 g via INTRAVENOUS
  Filled 2022-12-25: qty 10

## 2022-12-25 MED ORDER — ASPIRIN 81 MG PO CHEW
324.0000 mg | CHEWABLE_TABLET | Freq: Once | ORAL | Status: AC
  Administered 2022-12-25: 324 mg via ORAL
  Filled 2022-12-25: qty 4

## 2022-12-25 MED ORDER — LIDOCAINE 5 % EX PTCH
2.0000 | MEDICATED_PATCH | CUTANEOUS | Status: DC
  Administered 2022-12-25 – 2023-01-18 (×21): 2 via TRANSDERMAL
  Filled 2022-12-25 (×25): qty 2

## 2022-12-25 MED ORDER — METHYLPREDNISOLONE SODIUM SUCC 125 MG IJ SOLR
125.0000 mg | Freq: Once | INTRAMUSCULAR | Status: AC
  Administered 2022-12-25: 125 mg via INTRAVENOUS
  Filled 2022-12-25: qty 2

## 2022-12-25 MED ORDER — IPRATROPIUM-ALBUTEROL 0.5-2.5 (3) MG/3ML IN SOLN
3.0000 mL | Freq: Four times a day (QID) | RESPIRATORY_TRACT | Status: DC
  Administered 2022-12-25 – 2022-12-26 (×4): 3 mL via RESPIRATORY_TRACT
  Filled 2022-12-25 (×4): qty 3

## 2022-12-25 MED ORDER — SODIUM CHLORIDE 0.9 % IV SOLN
500.0000 mg | INTRAVENOUS | Status: DC
  Administered 2022-12-26 – 2022-12-27 (×2): 500 mg via INTRAVENOUS
  Filled 2022-12-25 (×2): qty 5

## 2022-12-25 MED ORDER — SODIUM CHLORIDE 0.9 % IV SOLN: INTRAVENOUS | Status: DC

## 2022-12-25 MED ORDER — ACETAMINOPHEN 325 MG PO TABS
650.0000 mg | ORAL_TABLET | Freq: Four times a day (QID) | ORAL | Status: DC | PRN
  Administered 2022-12-25 – 2022-12-28 (×2): 650 mg via ORAL
  Filled 2022-12-25 (×3): qty 2

## 2022-12-25 MED ORDER — ARFORMOTEROL TARTRATE 15 MCG/2ML IN NEBU
15.0000 ug | INHALATION_SOLUTION | Freq: Two times a day (BID) | RESPIRATORY_TRACT | Status: DC
  Administered 2022-12-25 – 2023-01-19 (×46): 15 ug via RESPIRATORY_TRACT
  Filled 2022-12-25 (×51): qty 2

## 2022-12-25 MED ORDER — BUDESONIDE 0.5 MG/2ML IN SUSP
RESPIRATORY_TRACT | Status: AC
  Filled 2022-12-25: qty 2

## 2022-12-25 MED ORDER — MELATONIN 3 MG PO TABS
3.0000 mg | ORAL_TABLET | Freq: Every evening | ORAL | Status: DC | PRN
  Administered 2022-12-25 – 2023-01-18 (×21): 3 mg via ORAL
  Filled 2022-12-25 (×21): qty 1

## 2022-12-25 MED ORDER — HEPARIN (PORCINE) 25000 UT/250ML-% IV SOLN
1600.0000 [IU]/h | INTRAVENOUS | Status: DC
  Administered 2022-12-25: 1600 [IU]/h via INTRAVENOUS
  Filled 2022-12-25: qty 250

## 2022-12-25 NOTE — Progress Notes (Signed)
PHARMACY - ANTICOAGULATION CONSULT NOTE  Pharmacy Consult for heparin Indication:  r/o PE  Allergies  Allergen Reactions   Lisinopril Cough   Tetanus Toxoid Adsorbed Swelling    Arm swelling   Other Cough    PEANUTS   Peanut Allergen Powder-Dnfp Cough    Patient Measurements: Height: 5\' 7"  (170.2 cm) Weight: 110 kg (242 lb 8.1 oz) IBW/kg (Calculated) : 61.6 Heparin Dosing Weight: 85kg  Vital Signs: Temp: 100.3 F (37.9 C) (10/26 0425) Temp Source: Rectal (10/26 0425) BP: 107/88 (10/26 0530) Pulse Rate: 104 (10/26 0530)  Labs: Recent Labs    12/25/22 0447 12/25/22 0454  HGB 14.4 15.3*  HCT 44.1 45.0  PLT 68*  --   LABPROT 14.7  --   INR 1.1  --   CREATININE 3.13* 3.40*  TROPONINIHS 226*  --     Estimated Creatinine Clearance: 18 mL/min (A) (by C-G formula based on SCr of 3.4 mg/dL (H)).   Medical History: Past Medical History:  Diagnosis Date   Anticoagulated on Coumadin    managed by cardiology   Anxiety    Chronic constipation    Chronic systolic CHF (congestive heart failure) (HCC) 2016   cardiologist--- dr end and followed by CHF clinic   CKD (chronic kidney disease), stage III (HCC)    COPD (chronic obstructive pulmonary disease) (HCC)    previous had seen pulmonology --- dr Sherene Sires, note in epic 03-14-2016, dx COPD GOLD 0   Coronary artery disease    cardiologist--- dr Bethena Roys;  last cardiac cath 01-10-20202 mild nonobstructive disease proxLAD;  per pt, had LHC prior to AVR in [place] 2016   DOE (dyspnea on exertion)    GERD (gastroesophageal reflux disease)    History of 2019 novel coronavirus disease (COVID-19) 12/19/2019   hospital admission w/ covid pneumonia , no intubatiion,  per pt symptoms resolved and back to baseline   History of cerebrovascular accident (CVA) with residual deficit    CVA in 2000 without residuals;  CVA post op AVR 01/ 2016 with residual right hand weakness   History of gastritis    History of pneumothorax    s/p right vats  11-19-2014 and  s/p left vats  12-13-2015   (both spontenaous due to bleb)   Hyperlipidemia    Hypertension    Hyperthyroidism    per pt followed by pcp,  dx approx 2014   Malignant neoplasm of upper-outer quadrant of left breast in female, 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   oncologist-- dr Mosetta Putt--- dx 10/ 2018  ,  s/p left lumptectomy with node dissection;  completed radiation 04-15-2017, no chemo   Multiple thyroid nodules    in care everywhere pt had left thyroid nodule biospy 09-18-2014 benign   NICM (nonischemic cardiomyopathy) (HCC) 2016   2016  ef 30%;  last echo in epic ef 45%   OA (osteoarthritis)    Osteoporosis    Paroxysmal atrial fibrillation [facility])    cardiologist--- dr end   Personal history of radiation therapy    left breast cancer  03-08-2017  to 04-05-2017   Thrombocytopenia [facility])    followed by dr Mosetta Putt   Valvular stenosis, aortic 03/12/2014   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  Pt is known to have chronic thrombocytopenia with current Plt at 68, followed as outpt by hem/onc.  Goal of Therapy:  Heparin level 0.3-0.7 units/ml Monitor platelets by anticoagulation protocol: Yes   Plan:  Give small heparin bolus of 2000 units (given Plt) followed by infusion at 1600 units/hr. Monitor heparin levels and CBC.  Vernard Gambles, PharmD, BCPS  12/25/2022,6:26 AM

## 2022-12-25 NOTE — Progress Notes (Signed)
PHARMACY - ANTICOAGULATION CONSULT NOTE  Pharmacy Consult for heparin Indication:  r/o PE  Allergies  Allergen Reactions   Tetanus Toxoid Adsorbed Swelling    Arm swelling   Zestril [Lisinopril] Cough   Peanut (Diagnostic) Cough    Patient Measurements: Height: 5\' 7"  (170.2 cm) Weight: 110 kg (242 lb 8.1 oz) IBW/kg (Calculated) : 61.6 Heparin Dosing Weight: 85kg  Vital Signs: Temp: 98.5 F (36.9 C) (10/26 1312) Temp Source: Rectal (10/26 0425) BP: 99/86 (10/26 1430) Pulse Rate: 102 (10/26 1430)  Labs: Recent Labs    12/25/22 0447 12/25/22 0454 12/25/22 0632 12/25/22 1428  HGB 14.4 15.3*  --   --   HCT 44.1 45.0  --   --   PLT 68*  --   --   --   APTT 33  --   --   --   LABPROT 14.7  --   --   --   INR 1.1  --   --   --   HEPARINUNFRC  --   --   --  >1.10*  CREATININE 3.13* 3.40*  --   --   CKTOTAL  --   --  43  --   TROPONINIHS 226*  --  288*  --     Estimated Creatinine Clearance: 18 mL/min (A) (by C-G formula based on SCr of 3.4 mg/dL (H)).   Medical History: Past Medical History:  Diagnosis Date   Anticoagulated on Coumadin    managed by cardiology   Anxiety    Chronic constipation    Chronic systolic CHF (congestive heart failure) (HCC) 2016   cardiologist--- dr end and followed by CHF clinic   CKD (chronic kidney disease), stage III (HCC)    COPD (chronic obstructive pulmonary disease) (HCC)    previous had seen pulmonology --- dr Sherene Sires, note in epic 03-14-2016, dx COPD GOLD 0   Coronary artery disease    cardiologist--- dr Bethena Roys;  last cardiac cath 01-10-20202 mild nonobstructive disease proxLAD;  per pt, had LHC prior to AVR in [place] 2016   DOE (dyspnea on exertion)    GERD (gastroesophageal reflux disease)    History of 2019 novel coronavirus disease (COVID-19) 12/19/2019   hospital admission w/ covid pneumonia , no intubatiion,  per pt symptoms resolved and back to baseline   History of cerebrovascular accident (CVA) with residual deficit     CVA in 2000 without residuals;  CVA post op AVR 01/ 2016 with residual right hand weakness   History of gastritis    History of pneumothorax    s/p right vats 11-19-2014 and  s/p left vats  12-13-2015   (both spontenaous due to bleb)   Hyperlipidemia    Hypertension    Hyperthyroidism    per pt followed by pcp,  dx approx 2014   Malignant neoplasm of upper-outer quadrant of left breast in female, 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   oncologist-- dr Mosetta Putt--- dx 10/ 2018  ,  s/p left lumptectomy with node dissection;  completed radiation 04-15-2017, no chemo   Multiple thyroid nodules    in care everywhere pt had left thyroid nodule biospy 09-18-2014 benign   Nausea, vomiting, and diarrhea 12/25/2022   NICM (nonischemic cardiomyopathy) (HCC) 2016   2016  ef 30%;  last echo in epic ef 45%   OA (osteoarthritis)    Osteoporosis    Paroxysmal atrial fibrillation [facility])    cardiologist--- dr end   Personal history of radiation therapy    left breast  cancer  03-08-2017  to 04-05-2017   Thrombocytopenia [facility])    followed by dr Mosetta Putt   Valvular stenosis, aortic 03/12/2014   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  Pt is known to have chronic thrombocytopenia with current Plt at 68, followed as outpt by hem/onc.  Initial aPTT >200 sec, heparin level >1.10.  Per RN, level was drawn from opposite arm.  No s/sx bleeding noted.  Goal of Therapy:  Heparin level 0.3-0.7 units/ml Monitor platelets by anticoagulation protocol: Yes   Plan:  HOLD  heparin infusion x1 hour RESTART heparin @1700  at 1350 units/hr 8 hour aPTT Daily CBC, heparin level and aPTT until correlating  Trixie Rude, PharmD Clinical Pharmacist 12/25/2022  3:17 PM

## 2022-12-25 NOTE — ED Provider Notes (Signed)
[place] EMERGENCY DEPARTMENT AT [facility] Provider Note   CSN: [id number] Arrival date & time: 12/25/22  0414     History  Chief Complaint  Patient presents with   Shortness of Breath    Patient to ED via ems with complaint of sob lower 70% range on room air having difficulty speaking. Patient was found to be pale and diaphoretic. EMS reports patient was hypotensive with a BP 70/40. EMS reports giving patient 150 mL NS through a 22 gauge IV in left wrist.EMS reports placing a NRB on patient which improved O2 saturation into 90% range.   Level 5 caveat due to acuity of condition Danielle [name] is a 77 y.o. female.  The history is provided by the patient and the EMS personnel.  Shortness of Breath Patient with extensive history including previous breast cancer, PAF, aortic valve replacement, CVA, cardiomyopathy, chronic kidney disease, thrombocytopenia Presents with acute shortness of breath Per EMS, they were called to the house for her husband who had an accidental fall from bed.  On their arrival they noted the patient appeared to be out of breath and was noted to be hypoxic. Oxygen was applied, but she was noted to be hypotensive.  Patient reports she had otherwise been at her baseline and no recent illnesses.  She reports that she got "worked up" after her husband fell of the bed  She has no chest pain.  No recent vomiting or diarrhea.  No recent fevers Past Medical History:  Diagnosis Date   Anticoagulated on Coumadin    managed by cardiology   Anxiety    Chronic constipation    Chronic systolic CHF (congestive heart failure) (HCC) 2016   cardiologist--- dr end and followed by CHF clinic   CKD (chronic kidney disease), stage III (HCC)    COPD (chronic obstructive pulmonary disease) (HCC)    previous had seen pulmonology --- dr Sherene Sires, note in epic 03-14-2016, dx COPD GOLD 0   Coronary artery disease    cardiologist--- dr Bethena Roys;  last cardiac cath  01-10-20202 mild nonobstructive disease proxLAD;  per pt, had LHC prior to AVR in [place] 2016   DOE (dyspnea on exertion)    GERD (gastroesophageal reflux disease)    History of 2019 novel coronavirus disease (COVID-19) 12/19/2019   hospital admission w/ covid pneumonia , no intubatiion,  per pt symptoms resolved and back to baseline   History of cerebrovascular accident (CVA) with residual deficit    CVA in 2000 without residuals;  CVA post op AVR 01/ 2016 with residual right hand weakness   History of gastritis    History of pneumothorax    s/p right vats 11-19-2014 and  s/p left vats  12-13-2015   (both spontenaous due to bleb)   Hyperlipidemia    Hypertension    Hyperthyroidism    per pt followed by pcp,  dx approx 2014   Malignant neoplasm of upper-outer quadrant of left breast in female, 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   oncologist-- dr Mosetta Putt--- dx 10/ 2018  ,  s/p left lumptectomy with node dissection;  completed radiation 04-15-2017, no chemo   Multiple thyroid nodules    in care everywhere pt had left thyroid nodule biospy 09-18-2014 benign   NICM (nonischemic cardiomyopathy) (HCC) 2016   2016  ef 30%;  last echo in epic ef 45%   OA (osteoarthritis)    Osteoporosis    Paroxysmal atrial fibrillation [facility])    cardiologist--- dr end   Personal history  of radiation therapy    left breast cancer  03-08-2017  to 04-05-2017   Thrombocytopenia [facility])    followed by dr Mosetta Putt   Valvular stenosis, aortic 03/12/2014   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 Authorizing Provider  albuterol (PROVENTIL HFA;VENTOLIN HFA) 108 (90 Base) MCG/ACT inhaler Inhale 2 puffs into the lungs every 6 (six) hours as needed for wheezing or shortness of breath. 12/11/14  Yes [provider]  apixaban (ELIQUIS) 5 MG TABS tablet Take 1 tablet  (5 mg total) by mouth 2 (two) times daily. 06/03/22  Yes End, Cristal Deer, MD  ascorbic acid (VITAMIN C) 500 MG tablet Take 500 mg by mouth daily.   Yes [provider]  carvedilol (COREG) 25 MG tablet Take 1 tablet (25 mg total) by mouth 2 (two) times daily with a meal. 08/30/22  Yes [place], Anderson Malta, FNP  Cholecalciferol 25 MCG (1000 UT) tablet Take 1,000 Units by mouth daily.   Yes [provider]  fluticasone (FLONASE) 50 MCG/ACT nasal spray Place 1 spray into both nostrils daily. 01/30/20  Yes [place], [name], MD  Multiple Vitamin (MULTIVITAMIN) tablet Take 1 tablet by mouth daily.   Yes [provider]  trolamine salicylate (ASPERCREME) 10 % cream Apply 1 application topically as needed for muscle pain.   Yes [provider]  eltrombopag (PROMACTA) 25 MG tablet Take 1 tablet (25 mg total) by mouth daily. Take on an empty stomach, 1 hour before a meal or 2 hours after. Patient not taking: Reported on 12/25/2022 11/22/22   Malachy Mood, MD  methimazole (TAPAZOLE) 5 MG tablet Take 3 tablets (15 mg total) by mouth daily. 03/31/21   [place], [name], MD  omeprazole (PRILOSEC) 40 MG capsule Take 1 capsule (40 mg total) by mouth daily. 04/09/22   [place], [name], MD  rosuvastatin (CRESTOR) 40 MG tablet Take 1 tablet (40 mg total) by mouth daily. 03/26/21   Laurey Morale, MD  sacubitril-valsartan (ENTRESTO) 49-51 MG Take 1 tablet by mouth 2 (two) times daily. 03/13/21   [place], Anderson Malta, FNP  senna-docusate (SENOKOT-S) 8.6-50 MG tablet Take 2 tablets by mouth at bedtime.    [provider]  spironolactone (ALDACTONE) 25 MG tablet Take 1 tablet (25 mg total) by mouth daily. 07/30/22 10/28/22  Alen Bleacher, NP  torsemide (DEMADEX) 20 MG tablet Take 1 tablet (20 mg total) by mouth daily. 07/30/22   Alen Bleacher, NP  traMADol (ULTRAM) 50 MG tablet Take 1 tablet (50 mg total) by mouth 3 (three) times daily. 06/30/22   Kirsteins, Victorino Sparrow, MD  valACYclovir (VALTREX) 500 MG  tablet Take on tablet by mouth twice a day for 3 days as needed for an outbreak 11/10/20   [name], MD      Allergies    Lisinopril, Tetanus toxoid adsorbed, Other, and Peanut allergen powder-dnfp    Review of Systems   Review of Systems  Unable to perform ROS: Acuity of condition  Respiratory:  Positive for shortness of breath.   Gastrointestinal:  Negative for blood in stool.  Genitourinary:  Negative for hematuria.    Physical Exam Updated Vital Signs BP 95/68   Pulse (!) 102   Temp 100.3 F (37.9 C) (Rectal)   Resp (!) 29   Ht  1.702 m (5\' 7" )   Wt 110 kg   SpO2 93%   BMI 37.98 kg/m  Physical Exam CONSTITUTIONAL: Elderly, ill-appearing HEAD: Normocephalic/atraumatic EYES: EOMI/PERRL ENMT: Mucous membranes moist NECK: supple no meningeal signs CV: Tachycardic LUNGS: Tachypneic, overall lungs are clear ABDOMEN: soft, nontender NEURO: Pt is awake/alert/appropriate, moves all extremitiesx4.  No facial droop.   EXTREMITIES: pulses normal/equal, full ROM SKIN: Skin is cool to touch PSYCH: Anxious ED Results / Procedures / Treatments   Labs (all labs ordered are listed, but only abnormal results are displayed) Labs Reviewed  CBC WITH DIFFERENTIAL/PLATELET - Abnormal; Notable for the following components:      Result Value   Platelets 68 (*)    All other components within normal limits  COMPREHENSIVE METABOLIC PANEL - Abnormal; Notable for the following components:   CO2 20 (*)    Glucose, Bld 140 (*)    BUN 31 (*)    Creatinine, Ser 3.13 (*)    GFR, Estimated 15 (*)    All other components within normal limits  BRAIN NATRIURETIC PEPTIDE - Abnormal; Notable for the following components:   B Natriuretic Peptide 739.8 (*)    All other components within normal limits  I-STAT CHEM 8, ED - Abnormal; Notable for the following components:   BUN 33 (*)    Creatinine, Ser 3.40 (*)    Glucose, Bld 141 (*)    Hemoglobin 15.3 (*)    All other components  within normal limits  TROPONIN I (HIGH SENSITIVITY) - Abnormal; Notable for the following components:   Troponin I (High Sensitivity) 226 (*)    All other components within normal limits  RESP PANEL BY RT-PCR (RSV, FLU A&B, COVID)  RVPGX2  CULTURE, BLOOD (ROUTINE X 2)  CULTURE, BLOOD (ROUTINE X 2)  PROTIME-INR  APTT  HEPARIN LEVEL (UNFRACTIONATED)  APTT  CBG MONITORING, ED  POC OCCULT BLOOD, ED  I-STAT CG4 LACTIC ACID, ED  TROPONIN I (HIGH SENSITIVITY)    EKG EKG Interpretation Date/Time:  Saturday December 25 2022 04:27:16 EDT Ventricular Rate:  106 PR Interval:  211 QRS Duration:  96 QT Interval:  367 QTC Calculation: 488 R Axis:   -22  Text Interpretation: Sinus tachycardia Multiform ventricular premature complexes Borderline prolonged PR interval Anterolateral infarct, age indeterminate Baseline wander in lead(s) V1 Interpretation limited secondary to artifact Confirmed by Zadie Rhine ([id number]) on 12/25/2022 4:33:38 AM  Radiology DG Chest Port 1 View  Result Date: 12/25/2022 CLINICAL DATA:  77 year old female with history of shortness of breath. EXAM: PORTABLE CHEST 1 VIEW COMPARISON:  Chest x-ray 10/01/2020. FINDINGS: Lung volumes are low. Elevated left hemidiaphragm. Diffuse interstitial prominence and peribronchial cuffing, similar to prior studies. New opacity at the left base which may reflect atelectasis and/or consolidation, with superimposed small left pleural effusion. No right pleural effusion. No pneumothorax. Suture lines are evident in the upper lobes of the lungs bilaterally, potentially from prior wedge resection or blebectomy. No evidence of pulmonary edema. Heart size appears borderline enlarged. The patient is rotated to the right on today's exam, resulting in distortion of the mediastinal contours and reduced diagnostic sensitivity and specificity for mediastinal pathology. Atherosclerotic calcifications in the thoracic aorta. Status post median sternotomy.  IMPRESSION: 1. New area of atelectasis and/or consolidation in the left lung base with small left pleural effusion. 2. Chronic elevation of the left hemidiaphragm redemonstrated. 3. Diffuse interstitial prominence and peribronchial cuffing which appears chronic, suggesting possible chronic bronchitis. 4. Aortic atherosclerosis. Electronically Signed   By: Reuel Boom  Entrikin M.D.   On: 12/25/2022 05:29    Procedures .Critical Care  Performed by: Zadie Rhine, MD Authorized by: Zadie Rhine, MD   Critical care provider statement:    Critical care time (minutes):  75   Critical care start time:  12/25/2022 4:50 AM   Critical care end time:  12/25/2022 6:05 AM   Critical care time was exclusive of:  Separately billable procedures and treating other patients   Critical care was necessary to treat or prevent imminent or life-threatening deterioration of the following conditions:  Respiratory failure, renal failure and cardiac failure   Critical care was time spent personally by me on the following activities:  Examination of patient, evaluation of patient's response to treatment, discussions with primary provider, development of treatment plan with patient or surrogate, ordering and review of radiographic studies, re-evaluation of patient's condition, pulse oximetry, review of old charts, ordering and review of laboratory studies and ordering and performing treatments and interventions   I assumed direction of critical care for this patient from another provider in my specialty: no       Medications Ordered in ED Medications  azithromycin (ZITHROMAX) 500 mg in sodium chloride 0.9 % 250 mL IVPB (has no administration in time range)  vancomycin (VANCOREADY) IVPB 2000 mg/400 mL (2,000 mg Intravenous New Bag/Given 12/25/22 0714)  heparin ADULT infusion 100 units/mL (25000 units/25mL) (1,600 Units/hr Intravenous New Bag/Given 12/25/22 0657)  aspirin chewable tablet 324 mg (324 mg Oral Given  12/25/22 0450)  cefTRIAXone (ROCEPHIN) 1 g in sodium chloride 0.9 % 100 mL IVPB (1 g Intravenous New Bag/Given 12/25/22 0636)  heparin bolus via infusion 2,000 Units (2,000 Units Intravenous Bolus from Bag 12/25/22 0657)    ED Course/ Medical Decision Making/ A&P Clinical Course as of 12/25/22 0720  Sat Dec 25, 2022  0450 Patient seen on arrival for shortness of breath.  She was noticeably tachypneic but appear to be improving with oxygen She claims to have been feeling at her baseline until her husband fell out of bed. She has an extensive history including atrial fibrillation, cardiomyopathy as well as aortic valve replacement.  She reports she is no longer taking her anticoagulation. Imaging and labs are pending at this time.  Will follow closely [DW]  0510 Creatinine(!): 3.40 Acute kidney injury [DW]  0542 Patient is clinically improving.  We are now weaning her oxygen back to 5 L.  Her blood pressures remain in the low 100s. Differential still includes pulmonary embolism but also pneumonia. ?  Infiltrate on x-ray, will start IV antibiotics.  However patient will likely need exploration for PE but due to acute kidney injury we will have to rely on other imaging such as V/Q imaging. [DW]  1610 Patient continues to improve. However it is unclear if this is pneumonia versus PE. Will empirically treat for both at this time.  She has not taken her anticoagulation for quite some time.  She has had no recent bleeding issues.  [DW]  9604 Patient stabilized, will be admitted to the hospitalist service.  Discussed with Dr. Madelyn Flavors for admission.  Will need PE rule out  [DW]    Clinical Course User Index [DW] Zadie Rhine, MD                                 Medical Decision Making Amount and/or Complexity of Data Reviewed Labs: ordered. Decision-making details documented in ED Course. Radiology: ordered.  Risk OTC drugs. Prescription drug management. Decision regarding  hospitalization.   This patient presents to the ED for concern of shortness of breath, this involves an extensive number of treatment options, and is a complaint that carries with it a high risk of complications and morbidity.  The differential diagnosis includes but is not limited to Acute coronary syndrome, pneumonia, acute pulmonary edema, pneumothorax, acute anemia, pulmonary embolism   Comorbidities that complicate the patient evaluation: Patient's presentation is complicated by their history of cardiomyopathy, COPD, aortic valve replacement, chronic kidney disease  Social Determinants of Health: Patient's  medication nonadherence   increases the complexity of managing their presentation  Additional history obtained: Additional history obtained from EMS  Records reviewed  outpatient records reviewed  Lab Tests: I Ordered, and personally interpreted labs.  The pertinent results include: Acute renal failure, thrombocytopenia that is chronic  Imaging Studies ordered: I ordered imaging studies including X-ray chest   I independently visualized and interpreted imaging which showed infiltrate noted I agree with the radiologist interpretation  Cardiac Monitoring: The patient was maintained on a cardiac monitor.  I personally viewed and interpreted the cardiac monitor which showed an underlying rhythm of:  sinus tachycardia  Medicines ordered and prescription drug management: I ordered medication including IV antibiotics for pneumonia Reevaluation of the patient after these medicines showed that the patient    stayed the same   Critical Interventions:   admission, will need evaluation for PE versus pneumonia  Consultations Obtained: I requested consultation with the admitting physician [facility] , and discussed  findings as well as pertinent plan - they recommend: Will admit  Reevaluation: After the interventions noted above, I reevaluated the patient and found that they have  :improved  Complexity of problems addressed: Patient's presentation is most consistent with  acute presentation with potential threat to life or bodily function  Disposition: After consideration of the diagnostic results and the patient's response to treatment,  I feel that the patent would benefit from admission   .           Final Clinical Impression(s) / ED Diagnoses Final diagnoses:  Acute respiratory failure with hypoxia (HCC)  AKI (acute kidney injury) [facility])    Rx / DC Orders ED Discharge Orders     None         Zadie Rhine, MD 12/25/22 [phone]

## 2022-12-25 NOTE — ED Notes (Signed)
ED TO INPATIENT HANDOFF REPORT  ED Nurse Name and Phone #: [phone] [name]   S Name/Age/Gender Danielle [name] 77 y.o. female Room/Bed: 010C/010C  Code Status   Code Status: Do not attempt resuscitation (DNR) PRE-ARREST INTERVENTIONS DESIRED  Home/SNF/Other Home Patient oriented to: self, place, time, and situation Is this baseline? Yes   Triage Complete: Triage complete  Chief Complaint Acute respiratory failure with hypoxia (HCC) [J96.01]  Triage Note No notes on file   Allergies Allergies  Allergen Reactions   Tetanus Toxoid Adsorbed Swelling    Arm swelling   Zestril [Lisinopril] Cough   Peanut (Diagnostic) Cough    Level of Care/Admitting Diagnosis ED Disposition     ED Disposition  Admit   Condition  --   Comment  Hospital Area: [facility] [[id number]]  Level of Care: Progressive [102]  Admit to Progressive based on following criteria: RESPIRATORY PROBLEMS hypoxemic/hypercapnic respiratory failure that is responsive to NIPPV (BiPAP) or High Flow Nasal Cannula (6-80 lpm). Frequent assessment/intervention, no > Q2 hrs < Q4 hrs, to maintain oxygenation and pulmonary hygiene.  May admit patient to [facility] or [facility] if equivalent level of care is available:: No  Covid Evaluation: Asymptomatic - no recent exposure (last 10 days) testing not required  Diagnosis: Acute respiratory failure with hypoxia [facility]) [[id number]]  Admitting Physician: Clydie Braun [[id number]]  Attending Physician: Clydie Braun [[id number]]  Certification:: I certify this patient will need inpatient services for at least 2 midnights  Expected Medical Readiness: 12/27/2022          B Medical/Surgery History Past Medical History:  Diagnosis Date   Anticoagulated on Coumadin    managed by cardiology   Anxiety    Chronic constipation    Chronic systolic CHF (congestive heart failure) (HCC) 2016   cardiologist--- dr end and followed by CHF clinic   CKD  (chronic kidney disease), stage III (HCC)    COPD (chronic obstructive pulmonary disease) (HCC)    previous had seen pulmonology --- dr Sherene Sires, note in epic 03-14-2016, dx COPD GOLD 0   Coronary artery disease    cardiologist--- dr Bethena Roys;  last cardiac cath 01-10-20202 mild nonobstructive disease proxLAD;  per pt, had LHC prior to AVR in [place] 2016   DOE (dyspnea on exertion)    GERD (gastroesophageal reflux disease)    History of 2019 novel coronavirus disease (COVID-19) 12/19/2019   hospital admission w/ covid pneumonia , no intubatiion,  per pt symptoms resolved and back to baseline   History of cerebrovascular accident (CVA) with residual deficit    CVA in 2000 without residuals;  CVA post op AVR 01/ 2016 with residual right hand weakness   History of gastritis    History of pneumothorax    s/p right vats 11-19-2014 and  s/p left vats  12-13-2015   (both spontenaous due to bleb)   Hyperlipidemia    Hypertension    Hyperthyroidism    per pt followed by pcp,  dx approx 2014   Malignant neoplasm of upper-outer quadrant of left breast in female, 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   oncologist-- dr Mosetta Putt--- dx 10/ 2018  ,  s/p left lumptectomy with node dissection;  completed radiation 04-15-2017, no chemo   Multiple thyroid nodules    in care everywhere pt had left thyroid nodule biospy 09-18-2014 benign   Nausea, vomiting, and diarrhea 12/25/2022   NICM (nonischemic cardiomyopathy) (HCC) 2016   2016  ef 30%;  last echo in epic ef  45%   OA (osteoarthritis)    Osteoporosis    Paroxysmal atrial fibrillation [facility])    cardiologist--- dr end   Personal history of radiation therapy    left breast cancer  03-08-2017  to 04-05-2017   Thrombocytopenia [facility])    followed by dr Mosetta Putt   Valvular stenosis, aortic 03/12/2014   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   Wears dentures    full upper   Past Surgical History:  Procedure Laterality Date   AORTIC VALVE  REPLACEMENT  03/12/2014   [facility] [place];  St Jude 23mm, medial Trifecta Bioprosthesis   BREAST LUMPECTOMY WITH RADIOACTIVE SEED AND SENTINEL LYMPH NODE BIOPSY Left 01/14/2017   Procedure: LEFT BREAST LUMPECTOMY WITH RADIOACTIVE SEED AND SENTINEL LYMPH NODE BIOPSY;  Surgeon: [name], MD;  Location: [facility] OR;  Service: General;  Laterality: Left;   CARDIAC CATHETERIZATION  02/12/2014   [facility] in [place]   HYSTEROSCOPY WITH D & C N/A 11/17/2020   Procedure: DILATATION AND CURETTAGE /HYSTEROSCOPY WITH MYOSURE;  Surgeon: [name], MD;  Location: [facility] ;  Service: Gynecology;  Laterality: N/A;   INTRAMEDULLARY (IM) NAIL INTERTROCHANTERIC Left 12/10/2015   Procedure: INTRAMEDULLARY (IM) NAIL INTERTROCHANTRIC;  Surgeon: Tarry Kos, MD;  Location: [facility] OR;  Service: Orthopedics;  Laterality: Left;   OPERATIVE ULTRASOUND N/A 11/17/2020   Procedure: OPERATIVE ULTRASOUND;  Surgeon: [name], MD;  Location[facility];  Service: Gynecology;  Laterality: N/A;   PLEURADESIS Left 12/03/2015   Procedure: PLEURADESIS;  Surgeon: Loreli Slot, MD;  Location: [facility] OR;  Service: Thoracic;  Laterality: Left;   RESECTION OF APICAL BLEB Left 12/03/2015   Procedure: BLEBECTOMY;  Surgeon: Loreli Slot, MD;  Location: [facility] OR;  Service: Thoracic;  Laterality: Left;   RIGHT/LEFT HEART CATH AND CORONARY ANGIOGRAPHY N/A 03/10/2020   Procedure: RIGHT/LEFT HEART CATH AND CORONARY ANGIOGRAPHY;  Surgeon: [name], MD;  Location: [facility] INVASIVE CV LAB;  Service: Cardiovascular;  Laterality: N/A;   VIDEO ASSISTED THORACOSCOPY Left 12/03/2015   Procedure: VIDEO ASSISTED THORACOSCOPY;  Surgeon: Loreli Slot, MD;  Location: [facility] OR;  Service: Thoracic;  Laterality: Left;   VIDEO ASSISTED THORACOSCOPY (VATS) W/TALC PLEUADESIS Right 11/19/2014   in [place]     A IV Location/Drains/Wounds Patient  Lines/Drains/Airways Status     Active Line/Drains/Airways     Name Placement date Placement time Site Days   Peripheral IV 12/25/22 20 G Right Antecubital 12/25/22  0443  Antecubital  less than 1   Peripheral IV 12/25/22 22 G Anterior;Left Wrist 12/25/22  0415  Wrist  less than 1            Intake/Output Last 24 hours  Intake/Output Summary (Last 24 hours) at 12/25/2022 1324 Last data filed at 12/25/2022 2993 Gross per 24 hour  Intake 466.49 ml  Output --  Net 466.49 ml    Labs/Imaging Results for orders placed or performed during the hospital encounter of 12/25/22 (from the past 48 hour(s))  Troponin I (High Sensitivity)     Status: Abnormal   Collection Time: 12/25/22  4:47 AM  Result Value Ref Range   Troponin I (High Sensitivity) 226 (HH) <18 ng/L    Comment: CRITICAL RESULT CALLED TO, READ BACK BY AND VERIFIED WITH Vito Backers RN [phone] M. ALAMANO (NOTE) Elevated high sensitivity troponin I (hsTnI) values and significant  changes across serial measurements may  suggest ACS but many other  chronic and acute conditions are known to elevate hsTnI results.  Refer to the "Links" section for chest pain algorithms and additional  guidance. Performed at [facility], [address] [place], [place] [address]   CBC with Differential     Status: Abnormal   Collection Time: 12/25/22  4:47 AM  Result Value Ref Range   WBC 9.1 4.0 - 10.5 K/uL   RBC 4.70 3.87 - 5.11 MIL/uL   Hemoglobin 14.4 12.0 - 15.0 g/dL   HCT 10.9 32.3 - 55.7 %   MCV 93.8 80.0 - 100.0 fL   MCH 30.6 26.0 - 34.0 pg   MCHC 32.7 30.0 - 36.0 g/dL   RDW 32.2 02.5 - 42.7 %   Platelets 68 (L) 150 - 400 K/uL    Comment: Immature Platelet Fraction may be clinically indicated, consider ordering this additional test [id number] REPEATED TO VERIFY    nRBC 0.0 0.0 - 0.2 %   Neutrophils Relative % 81 %   Neutro Abs 7.3 1.7 - 7.7 K/uL   Lymphocytes Relative 13 %   Lymphs Abs 1.2 0.7 - 4.0 K/uL    Monocytes Relative 6 %   Monocytes Absolute 0.5 0.1 - 1.0 K/uL   Eosinophils Relative 0 %   Eosinophils Absolute 0.0 0.0 - 0.5 K/uL   Basophils Relative 0 %   Basophils Absolute 0.0 0.0 - 0.1 K/uL   Immature Granulocytes 0 %   Abs Immature Granulocytes 0.04 0.00 - 0.07 K/uL    Comment: Performed at [facility], [address], [place], [place] [address]  Comprehensive metabolic panel     Status: Abnormal   Collection Time: 12/25/22  4:47 AM  Result Value Ref Range   Sodium 138 135 - 145 mmol/L   Potassium 4.4 3.5 - 5.1 mmol/L   Chloride 103 98 - 111 mmol/L   CO2 20 (L) 22 - 32 mmol/L   Glucose, Bld 140 (H) 70 - 99 mg/dL    Comment: Glucose reference range applies only to samples taken after fasting for at least 8 hours.   BUN 31 (H) 8 - 23 mg/dL   Creatinine, Ser 6.16 (H) 0.44 - 1.00 mg/dL   Calcium 07.3 8.9 - 71.0 mg/dL   Total Protein 7.2 6.5 - 8.1 g/dL   Albumin 3.6 3.5 - 5.0 g/dL   AST 17 15 - 41 U/L   ALT 13 0 - 44 U/L   Alkaline Phosphatase 79 38 - 126 U/L   Total Bilirubin 0.6 0.3 - 1.2 mg/dL   GFR, Estimated 15 (L) >60 mL/min    Comment: (NOTE) Calculated using the CKD-EPI Creatinine Equation (2021)    Anion gap 15 5 - 15    Comment: Performed at [facility], [address], [place], [place] [address]  Brain natriuretic peptide (order if patient c/o SOB ONLY)     Status: Abnormal   Collection Time: 12/25/22  4:47 AM  Result Value Ref Range   B Natriuretic Peptide 739.8 (H) 0.0 - 100.0 pg/mL    Comment: Performed at [facility], [address], [place], [place] [address]  Protime-INR     Status: None   Collection Time: 12/25/22  4:47 AM  Result Value Ref Range   Prothrombin Time 14.7 11.4 - 15.2 seconds   INR 1.1 0.8 - 1.2    Comment: (NOTE) INR goal varies based on device and disease states. Performed at [facility], [address], [place], [place] [address]   Resp panel by RT-PCR (RSV, Flu A&B, Covid) Anterior Nasal Swab      Status: None   Collection Time: 12/25/22  4:47 AM   Specimen: Anterior Nasal Swab  Result Value Ref Range   SARS Coronavirus 2 by RT PCR NEGATIVE NEGATIVE   Influenza A by PCR NEGATIVE NEGATIVE   Influenza B by PCR NEGATIVE NEGATIVE    Comment: (NOTE) The Xpert Xpress SARS-CoV-2/FLU/RSV plus assay is intended as an aid in the diagnosis of influenza from Nasopharyngeal swab specimens and should not be used as a sole basis for treatment. Nasal washings and aspirates are unacceptable for Xpert Xpress SARS-CoV-2/FLU/RSV testing.  Fact Sheet for Patients: BloggerCourse.com  Fact Sheet for Healthcare Providers: SeriousBroker.it  This test is not yet approved or cleared by the [place] FDA and has been authorized for detection and/or diagnosis of SARS-CoV-2 by FDA under an Emergency Use Authorization (EUA). This EUA will remain in effect (meaning this test can be used) for the duration of the COVID-19 declaration under Section 564(b)(1) of the Act, 21 U.S.C. section 360bbb-3(b)(1), unless the authorization is terminated or revoked.     Resp Syncytial Virus by PCR NEGATIVE NEGATIVE    Comment: (NOTE) Fact Sheet for Patients: BloggerCourse.com  Fact Sheet for Healthcare Providers: SeriousBroker.it  This test is not yet approved or cleared by the [place] FDA and has been authorized for detection and/or diagnosis of SARS-CoV-2 by FDA under an Emergency Use Authorization (EUA). This EUA will remain in effect (meaning this test can be used) for the duration of the COVID-19 declaration under Section 564(b)(1) of the Act, 21 U.S.C. section 360bbb-3(b)(1), unless the authorization is terminated or revoked.  Performed at [facility], [address], [place], [place] [address]   APTT     Status: None   Collection Time: 12/25/22  4:47 AM  Result Value Ref Range   aPTT 33  24 - 36 seconds    Comment: Performed at [facility], [address] [place], [place] [address]  POC occult blood, ED     Status: None   Collection Time: 12/25/22  4:49 AM  Result Value Ref Range   Fecal Occult Bld NEGATIVE NEGATIVE  I-stat chem 8, ED (not at [facility], [facility] or [facility])     Status: Abnormal   Collection Time: 12/25/22  4:54 AM  Result Value Ref Range   Sodium 139 135 - 145 mmol/L   Potassium 4.4 3.5 - 5.1 mmol/L   Chloride 104 98 - 111 mmol/L   BUN 33 (H) 8 - 23 mg/dL   Creatinine, Ser 9.14 (H) 0.44 - 1.00 mg/dL   Glucose, Bld 782 (H) 70 - 99 mg/dL    Comment: Glucose reference range applies only to samples taken after fasting for at least 8 hours.   Calcium, Ion 1.28 1.15 - 1.40 mmol/L   TCO2 22 22 - 32 mmol/L   Hemoglobin 15.3 (H) 12.0 - 15.0 g/dL   HCT 95.6 21.3 - 08.6 %  I-Stat CG4 Lactic Acid     Status: None   Collection Time: 12/25/22  4:55 AM  Result Value Ref Range   Lactic Acid, Venous 1.8 0.5 - 1.9 mmol/L  Blood culture (routine x 2)     Status: None (Preliminary result)   Collection Time: 12/25/22  6:12 AM   Specimen: BLOOD RIGHT HAND  Result Value Ref Range   Specimen Description BLOOD RIGHT  HAND    Special Requests      BOTTLES DRAWN AEROBIC ONLY Blood Culture adequate volume   Culture      NO GROWTH < 12 HOURS Performed at [facility], [address], [place], [place] [address]    Report Status PENDING   Blood culture (routine x 2)     Status: None (Preliminary result)   Collection Time: 12/25/22  6:25 AM   Specimen: BLOOD  Result Value Ref Range   Specimen Description BLOOD SITE NOT SPECIFIED    Special Requests      BOTTLES DRAWN AEROBIC AND ANAEROBIC Blood Culture adequate volume   Culture      NO GROWTH <12 HOURS Performed at [facility], [address], [place], [place] [address]    Report Status PENDING   Troponin I (High Sensitivity)     Status: Abnormal   Collection Time: 12/25/22  6:32 AM  Result Value Ref Range    Troponin I (High Sensitivity) 288 (HH) <18 ng/L    Comment: CRITICAL RESULT CALLED TO, READ BACK BY AND VERIFIED WITH K CORUM RN 12/25/2022 0748 BNUNNERY (NOTE) Elevated high sensitivity troponin I (hsTnI) values and significant  changes across serial measurements may suggest ACS but many other  chronic and acute conditions are known to elevate hsTnI results.  Refer to the "Links" section for chest pain algorithms and additional  guidance. Performed at [facility], [address], [place], [place] [address]   TSH     Status: None   Collection Time: 12/25/22  6:32 AM  Result Value Ref Range   TSH 0.649 0.350 - 4.500 uIU/mL    Comment: Performed by a 3rd Generation assay with a functional sensitivity of <=0.01 uIU/mL. Performed at [facility], [address], [place], [place] [address]   T4, free     Status: None   Collection Time: 12/25/22  6:32 AM  Result Value Ref Range   Free T4 1.07 0.61 - 1.12 ng/dL    Comment: (NOTE) Biotin ingestion may interfere with free T4 tests. If the results are inconsistent with the TSH level, previous test results, or the clinical presentation, then consider biotin interference. If needed, order repeat testing after stopping biotin. Performed at [facility], [address], [place], [place] [address]   Procalcitonin     Status: None   Collection Time: 12/25/22  6:32 AM  Result Value Ref Range   Procalcitonin 0.10 ng/mL    Comment:        Interpretation: PCT (Procalcitonin) <= 0.5 ng/mL: Systemic infection (sepsis) is not likely. Local bacterial infection is possible. (NOTE)       Sepsis PCT Algorithm           Lower Respiratory Tract                                      Infection PCT Algorithm    ----------------------------     ----------------------------         PCT < 0.25 ng/mL                PCT < 0.10 ng/mL          Strongly encourage             Strongly discourage   discontinuation of antibiotics    initiation  of antibiotics    ----------------------------     -----------------------------  PCT 0.25 - 0.50 ng/mL            PCT 0.10 - 0.25 ng/mL               OR       >80% decrease in PCT            Discourage initiation of                                            antibiotics      Encourage discontinuation           of antibiotics    ----------------------------     -----------------------------         PCT >= 0.50 ng/mL              PCT 0.26 - 0.50 ng/mL               AND        <80% decrease in PCT             Encourage initiation of                                             antibiotics       Encourage continuation           of antibiotics    ----------------------------     -----------------------------        PCT >= 0.50 ng/mL                  PCT > 0.50 ng/mL               AND         increase in PCT                  Strongly encourage                                      initiation of antibiotics    Strongly encourage escalation           of antibiotics                                     -----------------------------                                           PCT <= 0.25 ng/mL                                                 OR                                        > 80% decrease in PCT  Discontinue / Do not initiate                                             antibiotics  Performed at [facility], [address], [place], [place] [address]   CK     Status: None   Collection Time: 12/25/22  6:32 AM  Result Value Ref Range   Total CK 43 38 - 234 U/L    Comment: Performed at [facility], [address] [place], [place] [address]   *Note: Due to a large number of results and/or encounters for the requested time period, some results have not been displayed. A complete set of results can be found in Results Review.   DG Chest Port 1 View  Result Date: 12/25/2022 CLINICAL DATA:  77 year old female with history of  shortness of breath. EXAM: PORTABLE CHEST 1 VIEW COMPARISON:  Chest x-ray 10/01/2020. FINDINGS: Lung volumes are low. Elevated left hemidiaphragm. Diffuse interstitial prominence and peribronchial cuffing, similar to prior studies. New opacity at the left base which may reflect atelectasis and/or consolidation, with superimposed small left pleural effusion. No right pleural effusion. No pneumothorax. Suture lines are evident in the upper lobes of the lungs bilaterally, potentially from prior wedge resection or blebectomy. No evidence of pulmonary edema. Heart size appears borderline enlarged. The patient is rotated to the right on today's exam, resulting in distortion of the mediastinal contours and reduced diagnostic sensitivity and specificity for mediastinal pathology. Atherosclerotic calcifications in the thoracic aorta. Status post median sternotomy. IMPRESSION: 1. New area of atelectasis and/or consolidation in the left lung base with small left pleural effusion. 2. Chronic elevation of the left hemidiaphragm redemonstrated. 3. Diffuse interstitial prominence and peribronchial cuffing which appears chronic, suggesting possible chronic bronchitis. 4. Aortic atherosclerosis. Electronically Signed   By: Trudie Reed M.D.   On: 12/25/2022 05:29    Pending Labs Unresulted Labs (From admission, onward)     Start     Ordered   12/26/22 0500  Heparin level (unfractionated)  Daily,   R     Placed in "And" Linked Group   12/25/22 0645   12/26/22 0500  CBC  Daily,   R     Placed in "And" Linked Group   12/25/22 0645   12/26/22 0500  Basic metabolic panel  Tomorrow morning,   R        12/25/22 0827   12/25/22 1500  Heparin level (unfractionated)  Once-Timed,   URGENT        12/25/22 0645   12/25/22 1500  APTT  Once-Timed,   STAT        12/25/22 0654   12/25/22 1300  MRSA Next Gen by PCR, Nasal  Once,   R        12/25/22 1259   12/25/22 0827  Sodium, urine, random  Once,   R        12/25/22 0827    12/25/22 0827  Urea nitrogen, urine  Once,   R        12/25/22 0827   12/25/22 0827  Creatinine, urine, random  Once,   R        12/25/22 0827   12/25/22 0825  Urinalysis, w/ Reflex to Culture (Infection Suspected) -Urine, Clean Catch  (Urine Labs)  Once,   R       Question:  Specimen Source  Answer:  Urine, Clean Catch   12/25/22 0827            Vitals/Pain Today's Vitals   12/25/22 1215 12/25/22 1230 12/25/22 1312 12/25/22 1315  BP: 103/66 91/73  109/79  Pulse:      Resp: (!) 32 (!) 47  (!) 32  Temp:   98.5 F (36.9 C)   TempSrc:      SpO2:      Weight:      Height:      PainSc:        Isolation Precautions No active isolations  Medications Medications  heparin ADULT infusion 100 units/mL (25000 units/27mL) (1,600 Units/hr Intravenous Infusion Verify 12/25/22 0953)  sodium chloride flush (NS) 0.9 % injection 3 mL (3 mLs Intravenous Not Given 12/25/22 0952)  acetaminophen (TYLENOL) tablet 650 mg (has no administration in time range)    Or  acetaminophen (TYLENOL) suppository 650 mg (has no administration in time range)  ondansetron (ZOFRAN) tablet 4 mg ( Oral See Alternative 12/25/22 1011)    Or  ondansetron (ZOFRAN) injection 4 mg (4 mg Intravenous Given 12/25/22 1011)  albuterol (PROVENTIL) (2.5 MG/3ML) 0.083% nebulizer solution 2.5 mg (has no administration in time range)  LORazepam (ATIVAN) injection 0.5 mg (0.5 mg Intravenous Given 12/25/22 1012)  ipratropium-albuterol (DUONEB) 0.5-2.5 (3) MG/3ML nebulizer solution 3 mL (3 mLs Nebulization Given 12/25/22 1028)  budesonide (PULMICORT) nebulizer solution 0.5 mg (0.5 mg Nebulization Not Given 12/25/22 1131)  arformoterol (BROVANA) nebulizer solution 15 mcg (15 mcg Nebulization Given 12/25/22 1127)  cefTRIAXone (ROCEPHIN) 2 g in sodium chloride 0.9 % 100 mL IVPB (has no administration in time range)  azithromycin (ZITHROMAX) 500 mg in sodium chloride 0.9 % 250 mL IVPB (has no administration in time range)   predniSONE (DELTASONE) tablet 40 mg (has no administration in time range)  0.9 %  sodium chloride infusion (has no administration in time range)  aspirin chewable tablet 324 mg (324 mg Oral Given 12/25/22 0450)  cefTRIAXone (ROCEPHIN) 1 g in sodium chloride 0.9 % 100 mL IVPB (0 g Intravenous Stopped 12/25/22 0733)  azithromycin (ZITHROMAX) 500 mg in sodium chloride 0.9 % 250 mL IVPB (0 mg Intravenous Stopped 12/25/22 1127)  vancomycin (VANCOREADY) IVPB 2000 mg/400 mL (0 mg Intravenous Stopped 12/25/22 0922)  heparin bolus via infusion 2,000 Units (2,000 Units Intravenous Bolus from Bag 12/25/22 0657)  methylPREDNISolone sodium succinate (SOLU-MEDROL) 125 mg/2 mL injection 125 mg (125 mg Intravenous Given 12/25/22 1027)    Mobility walks with device     Focused Assessments Pulmonary Assessment Handoff:  Lung sounds: Bilateral Breath Sounds: Diminished, Clear L Breath Sounds: Diminished, Expiratory wheezes R Breath Sounds: Diminished, Expiratory wheezes O2 Device: Nasal Cannula O2 Flow Rate (L/min): 4 L/min    R Recommendations: See Admitting Provider Note  Report given to:   Additional Notes:

## 2022-12-25 NOTE — ED Notes (Signed)
Tried to page attending regarding critical value.

## 2022-12-25 NOTE — Progress Notes (Signed)
ED Pharmacy Antibiotic Sign Off An antibiotic consult was received from an ED provider for vancomycin per pharmacy dosing for Pneumonia. A chart review was completed to assess appropriateness.   The following one time order(s) were placed:   Vancomycin 2000mg  IV x1  Further antibiotic and/or antibiotic pharmacy consults should be ordered by the admitting provider if indicated.   Thank you for allowing pharmacy to be a part of this patient's care.   Arabella Merles, [facility]  Clinical Pharmacist 12/25/22 5:43 AM

## 2022-12-25 NOTE — H&P (Signed)
History and Physical    PatientMarland Kitchen Danielle [name] WUX:[id number] DOB: [birth year] DOA: 12/25/2022 DOS: the patient was seen and examined on 12/25/2022 PCP: [place], [name], MD  Patient coming from: Home  Chief Complaint:  Chief Complaint  Patient presents with   Shortness of Breath    Patient to ED via ems with complaint of sob lower 70% range on room air having difficulty speaking. Patient was found to be pale and diaphoretic. EMS reports patient was hypotensive with a BP 70/40. EMS reports giving patient 150 mL NS through a 22 gauge IV in left wrist.EMS reports placing a NRB on patient which improved O2 saturation into 90% range.   HPI: Danielle [name] is a 77 y.o. female with medical history significant of hypertension, hyperlipidemia, CAD, heart failure with reduced EF, paroxysmal atrial fibrillation, COPD, hyperthyroidism, thrombocytopenia, GERD, and obesity who presents with complaints of shortness of breath.  Patient reports that her husband had fallen out of bed this morning which she was scared and overly excited trying to call EMS.  In doing so reported acutely getting short of breath with cough.  She tried using her Symbicort inhaler without improvement.  At baseline patient is not on oxygen and utilizes a walker to ambulate.  She makes note that she has not seen a provider in 1 to 2 years and is not taking any medications really except for over-the-counter dizzy medicine.   She had fallen yesterday while trying to exercise on her stepper for which EMS had to be called.  Over the last 2 to 3 days patient reports that she is has had nausea, vomiting, and diarrhea that she notes started after eating some collard greens.  She did not have any significant abdominal pain until EMS picked her up yesterday.  Otherwise patient denies having any lower extremity swelling, fever, or chest pain.  She does report having intermittent palpitations.  She has a remote history of  smoking but quit 10 to 15 years ago  Upon EMS arrival patient was noted to be in obvious respiratory distress.  Her O2 saturations noted to be as low as 40-50s room air for which patient was placed on a nonrebreather.  In the emergency department patient was noted to have temperature of 100.3 F with pulse elevated up to 104, respirations 27 and 29, blood pressures 81/68-108/56, and O2 saturations currently maintained on 5 L of nasal cannula oxygen.  Labs noted WBC 9.1, hemoglobin 14.4, platelets 68 BUN 31, creatinine 3.13, glucose 140, INR 1.1, lactic acid 1.8, BNP 739.8, high-sensitivity troponin 226->288.  Chest x-ray had noted new areas of atelectasis and/or consolidation in the left lung base with a small left pleural effusion, and diffuse interstitial prominence and peribronchial coughing thought to be chronic bronchitis.  Influenza, COVID-19, and RSV screening were negative.  Patient was fecal occult negative.  Blood cultures have been obtained.  Patient had been given full dose aspirin, heparin bolus followed by drip, Rocephin, azithromycin, and vancomycin.  Review of Systems: As mentioned in the history of present illness. All other systems reviewed and are negative. Past Medical History:  Diagnosis Date   Anticoagulated on Coumadin    managed by cardiology   Anxiety    Chronic constipation    Chronic systolic CHF (congestive heart failure) (HCC) 2016   cardiologist--- Danielle [name] and followed by CHF clinic   CKD (chronic kidney disease), stage III (HCC)    COPD (chronic obstructive pulmonary disease) (HCC)    previous had seen pulmonology ---  Danielle Danielle [name], note in epic 03-14-2016, dx COPD GOLD 0   Coronary artery disease    cardiologist--- Danielle Danielle [name];  last cardiac cath 01-10-20202 mild nonobstructive disease proxLAD;  per pt, had LHC prior to AVR in [place] 2016   DOE (dyspnea on exertion)    GERD (gastroesophageal reflux disease)    History of 2019 novel coronavirus disease (COVID-19) 12/19/2019    hospital admission w/ covid pneumonia , no intubatiion,  per pt symptoms resolved and back to baseline   History of cerebrovascular accident (CVA) with residual deficit    CVA in 2000 without residuals;  CVA post op AVR 01/ 2016 with residual right hand weakness   History of gastritis    History of pneumothorax    s/p right vats 11-19-2014 and  s/p left vats  12-13-2015   (both spontenaous due to bleb)   Hyperlipidemia    Hypertension    Hyperthyroidism    per pt followed by pcp,  dx approx 2014   Malignant neoplasm of upper-outer quadrant of left breast in female, 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   oncologist-- Danielle [name]--- dx 10/ 2018  ,  s/p left lumptectomy with node dissection;  completed radiation 04-15-2017, no chemo   Multiple thyroid nodules    in care everywhere pt had left thyroid nodule biospy 09-18-2014 benign   NICM (nonischemic cardiomyopathy) (HCC) 2016   2016  ef 30%;  last echo in epic ef 45%   OA (osteoarthritis)    Osteoporosis    Paroxysmal atrial fibrillation [facility])    cardiologist--- Danielle [name]   Personal history of radiation therapy    left breast cancer  03-08-2017  to 04-05-2017   Thrombocytopenia [facility])    followed by Danielle [name]   Valvular stenosis, aortic 03/12/2014   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   Wears dentures    full upper   Past Surgical History:  Procedure Laterality Date   AORTIC VALVE REPLACEMENT  03/12/2014   [name] [place];  St Jude 23mm, medial Trifecta Bioprosthesis   BREAST LUMPECTOMY WITH RADIOACTIVE SEED AND SENTINEL LYMPH NODE BIOPSY Left 01/14/2017   Procedure: LEFT BREAST LUMPECTOMY WITH RADIOACTIVE SEED AND SENTINEL LYMPH NODE BIOPSY;  Surgeon: Danielle Loron, MD;  Location: [facility] OR;  Service: General;  Laterality: Left;   CARDIAC CATHETERIZATION  02/12/2014   [facility] in [place]   HYSTEROSCOPY WITH D & C N/A 11/17/2020   Procedure: DILATATION AND CURETTAGE /HYSTEROSCOPY WITH  MYOSURE;  Surgeon: Danielle Salles, MD;  Location: [facility] [place];  Service: Gynecology;  Laterality: N/A;   INTRAMEDULLARY (IM) NAIL INTERTROCHANTERIC Left 12/10/2015   Procedure: INTRAMEDULLARY (IM) NAIL INTERTROCHANTRIC;  Surgeon: Tarry Kos, MD;  Location: [facility] OR;  Service: Orthopedics;  Laterality: Left;   OPERATIVE ULTRASOUND N/A 11/17/2020   Procedure: OPERATIVE ULTRASOUND;  Surgeon: Danielle Salles, MD;  Location: [facility];  Service: Gynecology;  Laterality: N/A;   PLEURADESIS Left 12/03/2015   Procedure: PLEURADESIS;  Surgeon: Loreli Slot, MD;  Location: [facility] OR;  Service: Thoracic;  Laterality: Left;   RESECTION OF APICAL BLEB Left 12/03/2015   Procedure: BLEBECTOMY;  Surgeon: Loreli Slot, MD;  Location: [facility] OR;  Service: Thoracic;  Laterality: Left;   RIGHT/LEFT HEART CATH AND CORONARY ANGIOGRAPHY N/A 03/10/2020   Procedure: RIGHT/LEFT HEART CATH AND CORONARY ANGIOGRAPHY;  Surgeon: [name], MD;  Location: [facility] INVASIVE CV LAB;  Service: Cardiovascular;  Laterality: N/A;   VIDEO ASSISTED THORACOSCOPY Left 12/03/2015   Procedure: VIDEO ASSISTED THORACOSCOPY;  Surgeon: Loreli Slot, MD;  Location: [facility] OR;  Service: Thoracic;  Laterality: Left;   VIDEO ASSISTED THORACOSCOPY (VATS) W/TALC PLEUADESIS Right 11/19/2014   in [place]   Social History:  reports that she quit smoking about 12 years ago. Her smoking use included cigarettes. She started smoking about 22 years ago. She has a 2.5 pack-year smoking history. She has never used smokeless tobacco. She reports that she does not drink alcohol and does not use drugs.  Allergies  Allergen Reactions   Lisinopril Cough   Tetanus Toxoid Adsorbed Swelling    Arm swelling   Other Cough    PEANUTS   Peanut Allergen Powder-Dnfp Cough    Family History  Problem Relation Age of Onset   Diabetes Mother    Heart attack Mother 36   Diabetes Father    Lung  cancer Father    Diabetes Sister    Thyroid disease Sister    Diabetes Sister    HIV Brother    Colon polyps Neg Hx    Colon cancer Neg Hx     Prior to Admission medications   Medication Sig Start Date [name] Date Taking? Authorizing Provider  albuterol (PROVENTIL HFA;VENTOLIN HFA) 108 (90 Base) MCG/ACT inhaler Inhale 2 puffs into the lungs every 6 (six) hours as needed for wheezing or shortness of breath. 12/11/14  Yes [provider]  apixaban (ELIQUIS) 5 MG TABS tablet Take 1 tablet (5 mg total) by mouth 2 (two) times daily. 06/03/22  Yes [name], Cristal Deer, MD  ascorbic acid (VITAMIN C) 500 MG tablet Take 500 mg by mouth daily.   Yes [provider]  carvedilol (COREG) 25 MG tablet Take 1 tablet (25 mg total) by mouth 2 (two) times daily with a meal. 08/30/22  Yes Milford, Anderson Malta, FNP  Cholecalciferol 25 MCG (1000 UT) tablet Take 1,000 Units by mouth daily.   Yes [provider]  fluticasone (FLONASE) 50 MCG/ACT nasal spray Place 1 spray into both nostrils daily. 01/30/20  Yes [place], [name], MD  Multiple Vitamin (MULTIVITAMIN) tablet Take 1 tablet by mouth daily.   Yes [provider]  trolamine salicylate (ASPERCREME) 10 % cream Apply 1 application topically as needed for muscle pain.   Yes [provider]  eltrombopag (PROMACTA) 25 MG tablet Take 1 tablet (25 mg total) by mouth daily. Take on an empty stomach, 1 hour before a meal or 2 hours after. Patient not taking: Reported on 12/25/2022 11/22/22   Malachy Mood, MD  methimazole (TAPAZOLE) 5 MG tablet Take 3 tablets (15 mg total) by mouth daily. 03/31/21   [place], [name], MD  omeprazole (PRILOSEC) 40 MG capsule Take 1 capsule (40 mg total) by mouth daily. 04/09/22   [place], [name], MD  rosuvastatin (CRESTOR) 40 MG tablet Take 1 tablet (40 mg total) by mouth daily. 03/26/21   Laurey Morale, MD  sacubitril-valsartan (ENTRESTO) 49-51 MG Take 1 tablet by mouth 2 (two) times daily. 03/13/21   Milford,  Anderson Malta, FNP  senna-docusate (SENOKOT-S) 8.6-50 MG tablet Take 2 tablets by mouth at bedtime.    [provider]  spironolactone (ALDACTONE) 25 MG tablet Take 1 tablet (25 mg total) by mouth daily. 07/30/22 10/28/22  Alen Bleacher, NP  torsemide (DEMADEX) 20 MG tablet Take 1 tablet (20 mg total) by mouth daily. 07/30/22   Alen Bleacher, NP  traMADol Janean Sark)  50 MG tablet Take 1 tablet (50 mg total) by mouth 3 (three) times daily. 06/30/22   Kirsteins, Victorino Sparrow, MD  valACYclovir (VALTREX) 500 MG tablet Take on tablet by mouth twice a day for 3 days as needed for an outbreak 11/10/20   Danielle Salles, MD    Physical Exam: Vitals:   12/25/22 0530 12/25/22 0600 12/25/22 0615 12/25/22 0714  BP: 107/88 106/88 95/68   Pulse: (!) 104 (!) 101 (!) 102   Resp: (!) 28 (!) 28 (!) 29   Temp:      TempSrc:      SpO2: 95% 97% 93% 93%  Weight:      Height:        Constitutional: Elderly female who appears to be in some respiratory distress Eyes: PERRL, lids and conjunctivae normal ENMT: Mucous membranes are moist.   Neck: normal, supple,  Respiratory: Decreased overall aeration with expiratory wheezes appreciated in both lung fields.  Patient had been on BiPAP. Cardiovascular: Intermittently irregular,   No extremity edema. 2+ pedal pulses. No carotid bruits.  Abdomen: no tenderness, no masses palpated. Bowel sounds positive.  Musculoskeletal: no clubbing / cyanosis. No joint deformity upper and lower extremities. Good ROM, no contractures. Normal muscle tone.  Skin: no rashes, lesions, ulcers. No induration Neurologic: CN 2-12 grossly intact. Strength 5/5 in all 4.  Psychiatric: Normal judgment and insight. Alert and oriented x 3.  Anxious mood mood.   Data Reviewed:  EKG appears to show sinus tachycardia 106 bpm with PVCs with background artifact present.  Reviewed labs, imaging, and pertinent records as noted above in HPI.  Assessment and Plan:  SIRS Acute respiratory failure  with hypoxia COPD exacerbation Patient reported acute onset of shortness of breath this morning after husband had fallen out of bed with reports of cough.  Noted to be hypoxic with O2 saturations dropping into the 40s- 50s on room air with EMS prior to being placed on nonrebreather at 12 L.  Noted to have low-grade temperature of 100.3 F along with tachycardia and tachypnea meeting SIRS criteria.  On physical exam patient noted to have decreased overall aeration with wheezes appreciated in both lung fields.  Chest x-ray had noted new area of atelectasis in the left lung base with small left pleural effusion and diffuse peribronchial cuffing. Question COPD exacerbation +/- pneumonia versus pulmonary embolism as cause of symptoms as has history of paroxysmal atrial fibrillation not on anticoagulation.  Patient has been started on empiric antibiotics of Rocephin, vancomycin, and azithromycin. -Admit to a progressive bed -Continuous pulse oximetry with oxygen maintain O2 saturations greater than 92% -BiPAP as needed -Follow-up blood cultures -Check procalcitonin -Check VQ scan -Solu-Medrol 125 mg IV x 1 dose, then prednisone 40 mg daily -DuoNebs 4 times daily and albuterol nebs as needed -Brovana and budesonide nebs twice daily -Continue empiric antibiotics of Rocephin and azithromycin.  De-escalate when medically improved  Elevated troponin Acute.  High-sensitivity troponins elevated 226->288.  Patient had been given full dose aspirin and started on a heparin drip. -Checking echocardiogram -Continue to trend cardiac troponin  Acute kidney injury superimposed on chronic kidney disease stage IIIb Patient presents with creatinine elevated up to 3.13 with BUN 31.  Baseline creatinine previously noted to be 1.61 when checked on 9/19.  Suspect possibly related to patient's reports of nausea, vomiting, and diarrhea.  Clinically -Avoid possible nephrotoxic agents -Strict I&Os -Check urinalysis with  reflex culture -Check urine sodium, urine creatinine, urine urea -Normal saline  at 75 mL/h  Nausea, vomiting, diarrhea Patient reports having at 2-day history of nausea, vomiting, and diarrhea since eating collard greens.  Reports having abdominal pain but associates it with recent fall where EMS picked her up from the floor. -Continue to monitor intake and output -Check CT scan of the abdomen pelvis -Antiemetics as needed  Essential hypertension On admission blood pressures were noted to be soft.  Patient had not been on any blood pressure medications and seems at baseline. -Continue to monitor  Paroxysmal atrial fibrillation Patient appears to be in a sinus rhythm at this time. CHA2DS2-VASc score = 3 based off age and gender.  Patient not on any form of anticoagulation. -Follow-up telemetry  Heart failure with reduced ejection fraction Chronic.  Clinically patient does not appear to be grossly fluid overloaded.  BNP was elevated at 739.8.  Last echocardiogram noted EF to be around 50% with grade 1 diastolic dysfunction back in 12/2021. -Strict I&Os and daily weights -Follow-up echocardiogram  Hyperthyroidism Patient with a prior history of hyperthyroidism, but does not appear to be on any medications for treatment. -Check TSH and free T4  Thrombocytopenia Chronic.  Platelet count 68 which appears around patient's baseline.  No reports of bleeding and stool guaiacs were noted to be negative.  Obesity BMI 37.98 kg/m  DVT prophylaxis: Heparin Advance Care Planning:   Code Status: Do not attempt resuscitation (DNR) PRE-ARREST INTERVENTIONS DESIRED    Consults: None  Family Communication: Husband via dated over the phone Severity of Illness: The appropriate patient status for this patient is INPATIENT. Inpatient status is judged to be reasonable and necessary in order to provide the required intensity of service to ensure the patient's safety. The patient's presenting symptoms,  physical exam findings, and initial radiographic and laboratory data in the context of their chronic comorbidities is felt to place them at high risk for further clinical deterioration. Furthermore, it is not anticipated that the patient will be medically stable for discharge from the hospital within 2 midnights of admission.   * I certify that at the point of admission it is my clinical judgment that the patient will require inpatient hospital care spanning beyond 2 midnights from the point of admission due to high intensity of service, high risk for further deterioration and high frequency of surveillance required.*  Author: Clydie Braun, MD 12/25/2022 7:18 AM  For on call review [email or web address].

## 2022-12-26 ENCOUNTER — Encounter (HOSPITAL_COMMUNITY): Payer: Self-pay | Admitting: Internal Medicine

## 2022-12-26 ENCOUNTER — Other Ambulatory Visit (HOSPITAL_COMMUNITY): Payer: Medicare HMO

## 2022-12-26 DIAGNOSIS — N189 Chronic kidney disease, unspecified: Secondary | ICD-10-CM

## 2022-12-26 DIAGNOSIS — I480 Paroxysmal atrial fibrillation: Secondary | ICD-10-CM

## 2022-12-26 DIAGNOSIS — N179 Acute kidney failure, unspecified: Secondary | ICD-10-CM | POA: Diagnosis not present

## 2022-12-26 DIAGNOSIS — I10 Essential (primary) hypertension: Secondary | ICD-10-CM

## 2022-12-26 DIAGNOSIS — R197 Diarrhea, unspecified: Secondary | ICD-10-CM

## 2022-12-26 DIAGNOSIS — E0590 Thyrotoxicosis, unspecified without thyrotoxic crisis or storm: Secondary | ICD-10-CM

## 2022-12-26 DIAGNOSIS — R112 Nausea with vomiting, unspecified: Secondary | ICD-10-CM

## 2022-12-26 DIAGNOSIS — J9601 Acute respiratory failure with hypoxia: Secondary | ICD-10-CM

## 2022-12-26 DIAGNOSIS — R6510 Systemic inflammatory response syndrome (SIRS) of non-infectious origin without acute organ dysfunction: Secondary | ICD-10-CM | POA: Diagnosis not present

## 2022-12-26 DIAGNOSIS — I5022 Chronic systolic (congestive) heart failure: Secondary | ICD-10-CM

## 2022-12-26 DIAGNOSIS — E669 Obesity, unspecified: Secondary | ICD-10-CM

## 2022-12-26 LAB — BASIC METABOLIC PANEL
Anion gap: 9 (ref 5–15)
BUN: 36 mg/dL — ABNORMAL HIGH (ref 8–23)
CO2: 21 mmol/L — ABNORMAL LOW (ref 22–32)
Calcium: 9.4 mg/dL (ref 8.9–10.3)
Chloride: 106 mmol/L (ref 98–111)
Creatinine, Ser: 2.28 mg/dL — ABNORMAL HIGH (ref 0.44–1.00)
GFR, Estimated: 22 mL/min — ABNORMAL LOW (ref >60)
Glucose, Bld: 151 mg/dL — ABNORMAL HIGH (ref 70–99)
Potassium: 3.9 mmol/L (ref 3.5–5.1)
Sodium: 136 mmol/L (ref 135–145)

## 2022-12-26 LAB — CBC
HCT: 38.5 % (ref 36.0–46.0)
Hemoglobin: 12.8 g/dL (ref 12.0–15.0)
MCH: 30.9 pg (ref 26.0–34.0)
MCHC: 33.2 g/dL (ref 30.0–36.0)
MCV: 93 fL (ref 80.0–100.0)
Platelets: 66 10*3/uL — ABNORMAL LOW (ref 150–400)
RBC: 4.14 MIL/uL (ref 3.87–5.11)
RDW: 13.1 % (ref 11.5–15.5)
WBC: 14.9 10*3/uL — ABNORMAL HIGH (ref 4.0–10.5)
nRBC: 0 % (ref 0.0–0.2)

## 2022-12-26 LAB — D-DIMER, QUANTITATIVE: D-Dimer, Quant: >20 ug{FEU}/mL — ABNORMAL HIGH (ref 0.00–0.50)

## 2022-12-26 LAB — TROPONIN I (HIGH SENSITIVITY): Troponin I (High Sensitivity): 237 ng/L (ref <18)

## 2022-12-26 LAB — APTT
aPTT: >200 s (ref 24–36)
aPTT: 198 s (ref 24–36)

## 2022-12-26 LAB — HEPARIN LEVEL (UNFRACTIONATED)
Heparin Unfractionated: >1.1 [IU]/mL — ABNORMAL HIGH (ref 0.30–0.70)
Heparin Unfractionated: >1.1 [IU]/mL — ABNORMAL HIGH (ref 0.30–0.70)

## 2022-12-26 MED ORDER — HEPARIN (PORCINE) 25000 UT/250ML-% IV SOLN
1150.0000 [IU]/h | INTRAVENOUS | Status: DC
  Administered 2022-12-26: 1150 [IU]/h via INTRAVENOUS

## 2022-12-26 MED ORDER — HEPARIN (PORCINE) 25000 UT/250ML-% IV SOLN
900.0000 [IU]/h | INTRAVENOUS | Status: DC
  Administered 2022-12-26 – 2022-12-28 (×3): 900 [IU]/h via INTRAVENOUS
  Filled 2022-12-26 (×2): qty 250

## 2022-12-26 NOTE — Evaluation (Signed)
Physical Therapy Evaluation Patient Details Name: Danielle Danielle [name] MRN: [id number] DOB: Aug 07, 1945 Today's Date: 12/26/2022  History of Present Illness  Pt is 77 year old presented [facility] Danielle [name]  12/25/22 for acute respiratory failure with hypoxia and copd exacerbation. PMH - anxiety, breast cancer, COPD, CHF, CAD, HTN, and CVA with residual RUE weakness, Lt hip fx with IM nail  Clinical Impression  Pt admitted with above diagnosis and presents to PT with functional limitations due to deficits listed below (See PT problem list). Pt needs skilled PT to maximize independence and safety. Pt very anxious and dyspneic with mobility. Only able to amb 10' before having to sit and rest. Pt likely will need to progress with activity tolerance to return home with HHPT. If pt doesn't progress quickly will need to consider other post acute options.           If plan is discharge home, recommend the following: A little help with walking and/or transfers;A little help with bathing/dressing/bathroom;Assistance with cooking/housework;Assist for transportation   Can travel by private vehicle        Equipment Recommendations None recommended by PT  Recommendations for Other Services       Functional Status Assessment Patient has had a recent decline in their functional status and demonstrates the ability to make significant improvements in function in a reasonable and predictable amount of time.     Precautions / Restrictions Precautions Precautions: Fall;Other (comment) Precaution Comments: watch SpO2 and SOB Restrictions Weight Bearing Restrictions: No      Mobility  Bed Mobility Overal bed mobility: Needs Assistance Bed Mobility: Supine to Sit, Sit to Supine     Supine to sit: Min assist, HOB elevated, Used rails Sit to supine: Mod assist   General bed mobility comments: Assist to elevate trunk into sitting and bring hips to EOB. Assist to bring legs back up into bed     Transfers Overall transfer level: Needs assistance Equipment used: Rollator (4 wheels), 1 person hand held assist Transfers: Sit to/from Stand, Bed to chair/wheelchair/BSC Sit to Stand: Min assist   Step pivot transfers: Min assist       General transfer comment: Assist to power up and for stability    Ambulation/Gait Ambulation/Gait assistance: Contact guard assist Gait Distance (Feet): 10 Feet Assistive device: Rollator (4 wheels) Gait Pattern/deviations: Step-through pattern, Decreased step length - right, Decreased step length - left, Shuffle, Trunk flexed Gait velocity: decr Gait velocity interpretation: <1.31 ft/sec, indicative of household ambulator   General Gait Details: Assist for safety and lines. Pt kept brakes of rollator in locked position for more stability. Anxious, SOB, and fatigued quickly. Sat down Danielle [name] to rest. Rolled back to bed with rollator and performed step pivot back to bed with hand held assist  Stairs            Wheelchair Mobility     Tilt Bed    Modified Rankin (Stroke Patients Only)       Balance Overall balance assessment: Needs assistance Sitting-balance support: No upper extremity supported, Feet supported Sitting balance-Leahy Scale: Good     Standing balance support: Single extremity supported, Bilateral upper extremity supported Standing balance-Leahy Scale: Poor Standing balance comment: UE support and CGA                             Pertinent Vitals/Pain Pain Assessment Pain Assessment: No/denies pain    Home Living Family/patient expects to be discharged to::  Private residence Living Arrangements: Spouse/significant other Available Help at Discharge: Family;Available 24 hours/day Type of Home: House Home Access: Level entry       Home Layout: One level Home Equipment: Rollator (4 wheels);Shower seat;BSC/3in1;Wheelchair - manual Additional Comments: Husband has Parkinson's    Prior  Function Prior Level of Function : Needs assist       Physical Assist : ADLs (physical)   ADLs (physical): Bathing Mobility Comments: Amb with rollator ADLs Comments: Aide assist with bath in walk in tub     Extremity/Trunk Assessment   Upper Extremity Assessment Upper Extremity Assessment: Defer to OT evaluation    Lower Extremity Assessment Lower Extremity Assessment: Generalized weakness       Communication   Communication Communication: No apparent difficulties  Cognition Arousal: Alert Behavior During Therapy: Anxious Overall Cognitive Status: Within Functional Limits for tasks assessed                                          General Comments General comments (skin integrity, edema, etc.): Pt Danielle [name] 4L O2 with SpO2 89-94% during treatement    Exercises     Assessment/Plan    PT Assessment Patient needs continued PT services  PT Problem List Decreased strength;Decreased balance;Decreased activity tolerance;Decreased mobility;Cardiopulmonary status limiting activity;Obesity       PT Treatment Interventions DME instruction;Gait training;Functional mobility training;Therapeutic activities;Therapeutic exercise;Balance training;Patient/family education    PT Goals (Current goals can be found in the Care Plan section)  Acute Rehab PT Goals Patient Stated Goal: return home PT Goal Formulation: With patient Time For Goal Achievement: 01/09/23 Potential to Achieve Goals: Good    Frequency Min 1X/week     Co-evaluation               AM-PAC PT "6 Clicks" Mobility  Outcome Measure Help needed turning from your back to your side while in a flat bed without using bedrails?: A Little Help needed moving from lying Danielle [name] to sitting Danielle [name] the side of a flat bed without using bedrails?: A Little Help needed moving to and from a bed to a chair (including a wheelchair)?: A Little Help needed standing up from a chair using your arms (e.g.,  wheelchair or bedside chair)?: A Little Help needed to walk in hospital room?: Total Help needed climbing 3-5 steps with a railing? : Total 6 Click Score: 14    End of Session Equipment Utilized During Treatment: Oxygen Activity Tolerance: Patient limited by fatigue;Treatment limited secondary to medical complications (Comment) (dyspnea) Patient left: in bed;with call bell/phone within reach;with bed alarm set Nurse Communication: Mobility status PT Visit Diagnosis: Other abnormalities of gait and mobility (R26.89);Muscle weakness (generalized) (M62.81);History of falling (Z91.81)    Time: 6578-4696 PT Time Calculation (min) (ACUTE ONLY): 32 min   Charges:   PT Evaluation $PT Eval Moderate Complexity: 1 Mod PT Treatments $Gait Training: 8-22 mins PT General Charges $$ ACUTE PT VISIT: 1 Visit         [facility] PT Acute Rehabilitation Services Office [phone]   Danielle Danielle [name] [name] 12/26/2022, 4:02 PM

## 2022-12-26 NOTE — Progress Notes (Signed)
Patient has stated she used oxygen at home. I notified her that she may not discharge until all her scans are done. I notified patient that her heart rate goes into the 140s, oxygen goes down into the 80s, and she needs more assistance than she thinks. She can't make it 5-10 ft to the bedside commode without getting SOB with exertion. Patient needs further teaching.

## 2022-12-26 NOTE — Progress Notes (Signed)
0202- provider aware of critical ptt levels. Pharmacy to dose heparin.

## 2022-12-26 NOTE — Progress Notes (Signed)
PHARMACY - ANTICOAGULATION CONSULT NOTE  Pharmacy Consult for heparin Indication:  r/o PE  Allergies  Allergen Reactions   Tetanus Toxoid Adsorbed Swelling    Arm swelling   Zestril [Lisinopril] Cough   Peanut (Diagnostic) Cough    Patient Measurements: Height: 5\' 7"  (170.2 cm) Weight: 110 kg (242 lb 8.1 oz) IBW/kg (Calculated) : 61.6 Heparin Dosing Weight: 85kg  Vital Signs: Temp: 97.7 F (36.5 C) (10/26 1928) Temp Source: Oral (10/26 1928) BP: 107/82 (10/27 0002) Pulse Rate: 105 (10/27 0002)  Labs: Recent Labs    12/25/22 0447 12/25/22 0454 12/25/22 0632 12/25/22 1428 12/26/22 0043  HGB 14.4 15.3*  --   --   --   HCT 44.1 45.0  --   --   --   PLT 68*  --   --   --   --   APTT 33  --   --  >200* >200*  LABPROT 14.7  --   --   --   --   INR 1.1  --   --   --   --   HEPARINUNFRC  --   --   --  >1.10* >1.10*  CREATININE 3.13* 3.40*  --   --   --   CKTOTAL  --   --  43  --   --   TROPONINIHS 226*  --  288*  --   --     Estimated Creatinine Clearance: 18 mL/min (A) (by C-G formula based on SCr of 3.4 mg/dL (H)).   Medical History: Past Medical History:  Diagnosis Date   Anticoagulated on Coumadin    managed by cardiology   Anxiety    Chronic constipation    Chronic systolic CHF (congestive heart failure) (HCC) 2016   cardiologist--- dr end and followed by CHF clinic   CKD (chronic kidney disease), stage III (HCC)    COPD (chronic obstructive pulmonary disease) (HCC)    previous had seen pulmonology --- dr Sherene Sires, note in epic 03-14-2016, dx COPD GOLD 0   Coronary artery disease    cardiologist--- dr Bethena Roys;  last cardiac cath 01-10-20202 mild nonobstructive disease proxLAD;  per pt, had LHC prior to AVR in [place] 2016   DOE (dyspnea on exertion)    GERD (gastroesophageal reflux disease)    History of 2019 novel coronavirus disease (COVID-19) 12/19/2019   hospital admission w/ covid pneumonia , no intubatiion,  per pt symptoms resolved and back to baseline    History of cerebrovascular accident (CVA) with residual deficit    CVA in 2000 without residuals;  CVA post op AVR 01/ 2016 with residual right hand weakness   History of gastritis    History of pneumothorax    s/p right vats 11-19-2014 and  s/p left vats  12-13-2015   (both spontenaous due to bleb)   Hyperlipidemia    Hypertension    Hyperthyroidism    per pt followed by pcp,  dx approx 2014   Malignant neoplasm of upper-outer quadrant of left breast in female, 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  Pt is known to have chronic thrombocytopenia with current Plt at 68, followed as outpt by hem/onc.  Follow Up aPTT >200 sec, heparin level >1.10.  Per RN, level was drawn from opposite arm.  No s/sx bleeding per RN. No issues reported.  Goal of Therapy:  Heparin level  0.3-0.7 units/ml Monitor platelets by anticoagulation protocol: Yes   Plan:  HOLD heparin infusion x1 hour RESTART heparin @0300  at 1150 units/hr 8 hour aPTT Daily CBC, heparin level and aPTT until correlating  Arabella Merles, PharmD. Clinical Pharmacist 12/26/2022 2:00 AM

## 2022-12-26 NOTE — Progress Notes (Signed)
PHARMACY - ANTICOAGULATION CONSULT NOTE  Pharmacy Consult for heparin Indication:  r/o PE  Allergies  Allergen Reactions   Tetanus Toxoid Adsorbed Swelling    Arm swelling   Zestril [Lisinopril] Cough   Peanut (Diagnostic) Cough    Patient Measurements: Height: 5\' 7"  (170.2 cm) Weight: 110 kg (242 lb 8.1 oz) IBW/kg (Calculated) : 61.6 Heparin Dosing Weight: 85kg  Vital Signs: Temp: 98.7 F (37.1 C) (10/27 1548) Temp Source: Oral (10/27 1548) BP: 117/91 (10/27 1250) Pulse Rate: 112 (10/27 1548)  Labs: Recent Labs    12/25/22 0447 12/25/22 0454 12/25/22 0632 12/25/22 1428 12/26/22 0043 12/26/22 0420 12/26/22 1347  HGB 14.4 15.3*  --   --   --  12.8  --   HCT 44.1 45.0  --   --   --  38.5  --   PLT 68*  --   --   --   --  66*  --   APTT 33  --   --  >200* >200*  --  198*  LABPROT 14.7  --   --   --   --   --   --   INR 1.1  --   --   --   --   --   --   HEPARINUNFRC  --   --   --  >1.10* >1.10* >1.10*  --   CREATININE 3.13* 3.40*  --   --   --  2.28*  --   CKTOTAL  --   --  43  --   --   --   --   TROPONINIHS 226*  --  288*  --   --   --  237*    Estimated Creatinine Clearance: 26.8 mL/min (A) (by C-G formula based on SCr of 2.28 mg/dL (H)).   Medical History: Past Medical History:  Diagnosis Date   Anticoagulated on Coumadin    managed by cardiology   Anxiety    Chronic constipation    Chronic systolic CHF (congestive heart failure) (HCC) 2016   cardiologist--- dr end and followed by CHF clinic   CKD (chronic kidney disease), stage III (HCC)    COPD (chronic obstructive pulmonary disease) (HCC)    previous had seen pulmonology --- dr Sherene Sires, note in epic 03-14-2016, dx COPD GOLD 0   Coronary artery disease    cardiologist--- dr Bethena Roys;  last cardiac cath 01-10-20202 mild nonobstructive disease proxLAD;  per pt, had LHC prior to AVR in [place] 2016   DOE (dyspnea on exertion)    GERD (gastroesophageal reflux disease)    History of 2019 novel coronavirus  disease (COVID-19) 12/19/2019   hospital admission w/ covid pneumonia , no intubatiion,  per pt symptoms resolved and back to baseline   History of cerebrovascular accident (CVA) with residual deficit    CVA in 2000 without residuals;  CVA post op AVR 01/ 2016 with residual right hand weakness   History of gastritis    History of pneumothorax    s/p right vats 11-19-2014 and  s/p left vats  12-13-2015   (both spontenaous due to bleb)   Hyperlipidemia    Hypertension    Hyperthyroidism    per pt followed by pcp,  dx approx 2014   Malignant neoplasm of upper-outer quadrant of left breast in female, 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   oncologist-- dr Mosetta Putt--- dx 10/ 2018  ,  s/p left lumptectomy with node dissection;  completed radiation 04-15-2017, no chemo   Multiple  thyroid nodules    in care everywhere pt had left thyroid nodule biospy 09-18-2014 benign   Nausea, vomiting, and diarrhea 12/25/2022   NICM (nonischemic cardiomyopathy) (HCC) 2016   2016  ef 30%;  last echo in epic ef 45%   OA (osteoarthritis)    Osteoporosis    Paroxysmal atrial fibrillation [facility])    cardiologist--- dr end   Personal history of radiation therapy    left breast cancer  03-08-2017  to 04-05-2017   Thrombocytopenia [facility])    followed by dr Mosetta Putt   Valvular stenosis, aortic 03/12/2014   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  Pt is known to have chronic thrombocytopenia with current Plt at 68, followed as outpt by hem/onc.  aPTT (198 sec) remains elevated  - this is the third supratherapeutic aPTT ~200 sec since starting heparin yesterday morning.  RN reported oozing from both IV sites (R >> L; heparin infusing through L).  No further oozing noted since removing right IV site.  Hgb 12.8, pltc 66, D-dimer >20, still awaiting V/Q scan.    Goal of Therapy:  Heparin level 0.3-0.7 units/ml Monitor platelets by anticoagulation protocol: Yes   Plan:  HOLD heparin infusion x1 hour RESTART heparin @1700  at 900 units/hr 8 hour aPTT Daily CBC (watch pltc), heparin level and aPTT until correlating  Trixie Rude, PharmD Clinical Pharmacist 12/26/2022  4:13 PM

## 2022-12-26 NOTE — Hospital Course (Addendum)
77 y.o. female past medical history significant for essential hypertension, hyperlipidemia systolic heart failure, paroxysmal atrial fibrillation on Eliquis presented to ED for shortness of breath and hypoxia found to be in sepsis probably due to pneumonia, there was also concern for PE VQ scan was done on 12/27/2022 that confirms a PE

## 2022-12-26 NOTE — Progress Notes (Signed)
PROGRESS NOTE    Danielle [name]  DGL:[id number] DOB: 04/01/1945 DOA: 12/25/2022 PCP: [place], [name], MD   Brief Narrative: Danielle [name] is a 77 y.o. female with a history of hypertension, hyperlipidemia, CAD, heart failure with reduced EF, paroxysmal atrial fibrillation, COPD, hyperthyroidism, thrombocytopenia, GERD, obesity.  Patient presented secondary to shortness of breath with hypoxia and found to have evidence of sepsis with presumed pneumonia. Also concern for possible acute PE, started on Heparin IV. Blood cultures obtained and antibiotics started.   Assessment and Plan:  Sepsis Present on admission with presumed pneumonia. Blood cultures obtained. Antibiotics initiated for pneumonia. -Continue antibiotics -Follow-up blood cultures  Presumed community acquired pneumonia Patient with hypoxia and fever with x-ray evidence suggesting possible left lung base infection. Patient started empirically on Ceftriaxone and azithromycin. -Continue Ceftriaxone and Azithromycin  COPD exacerbation Present on admission. Patient treated with steroids and breathing treatments. No wheezing noted on exam today. -Continue Duonebs, Brovana and budesonide  Acute respiratory failure with hypoxia Presumed secondary to COPD exacerbation versus possible pneumonia. Patient has required up to non-rebreather, now down to 4 L/min of oxygen. Concern for possible PE as well; heparin IV started and V/Q scan ordered. -Wean to room air as able -Ambulatory pulse ox prior to discharge -Check D-dimer  Elevated troponin Presumed demand ischemia. No chest pain. Transthoracic Echocardiogram ordered and pending. If no wall motion abnormality, will defer to outpatient follow-up.  AKI on CKD stage IIIb Baseline creatinine of about 1.6. Creatinine of 3.13 on admission with peak of 3.40. Creatinine improved to 2.28 today -Continue IV fluids  Nausea/vomiting/diarrhea CT abdomen/pelvis  without evidence of acute inflammation.  Primary hypertension Noted. Patient is on spironolactone and Coreg as an outpatient.  Paroxysmal atrial fibrillation Noted. Patient is on Coreg and Eliquis as an outpatient.  Heart failure with reduced ejection fracture Last LVEF from 2023 significant for an LVEF of 50% with grade 1 diastolic dysfunction. Patient is on Coreg and spironolactone Transthoracic Echocardiogram ordered this admission and is pending. -Follow-up Transthoracic Echocardiogram  Hyperthyroidism TSH and free T4 within normal limits.  Thrombocytopenia Chronic. Stable.  Aortic atherosclerosis Noted on imaging.  Obesity Estimated body mass index is 37.98 kg/m as calculated from the following:   Height as of this encounter: 5\' 7"  (1.702 m).   Weight as of this encounter: 110 kg.   DVT prophylaxis: Heparin IV Code Status:   Code Status: Do not attempt resuscitation (DNR) PRE-ARREST INTERVENTIONS DESIRED Family Communication: None at bedside Disposition Plan: Discharge home likely in 1-3 days pending improvement of hypoxia, transition to oral antibiotics   Consultants:  None  Procedures:  None  Antimicrobials: Ceftriaxone Azithromycin    Subjective: Patient reports breathing better. No issues from overnight.  Objective: BP 128/84 (BP Location: Right Arm)   Pulse (!) 102   Temp 97.8 F (36.6 C) (Oral)   Resp (!) 24   Ht 5\' 7"  (1.702 m)   Wt 110 kg   SpO2 97%   BMI 37.98 kg/m   Examination:  General exam: Appears calm and comfortable Respiratory system: Clear to auscultation. Respiratory effort normal. Cardiovascular system: S1 & S2 heard, RRR. Gastrointestinal system: Abdomen is nondistended, soft and nontender. Normal bowel sounds heard. Central nervous system: Alert and oriented to person and place, but not time. Musculoskeletal: No edema. No calf tenderness   Data Reviewed: I have personally reviewed following labs and imaging  studies  CBC Lab Results  Component Value Date   WBC 14.9 (H) 12/26/2022   RBC  4.14 12/26/2022   HGB 12.8 12/26/2022   HCT 38.5 12/26/2022   MCV 93.0 12/26/2022   MCH 30.9 12/26/2022   PLT 66 (L) 12/26/2022   MCHC 33.2 12/26/2022   RDW 13.1 12/26/2022   LYMPHSABS 1.2 12/25/2022   MONOABS 0.5 12/25/2022   EOSABS 0.0 12/25/2022   BASOSABS 0.0 12/25/2022     Last metabolic panel Lab Results  Component Value Date   NA 136 12/26/2022   K 3.9 12/26/2022   CL 106 12/26/2022   CO2 21 (L) 12/26/2022   BUN 36 (H) 12/26/2022   CREATININE 2.28 (H) 12/26/2022   GLUCOSE 151 (H) 12/26/2022   GFRNONAA 22 (L) 12/26/2022   GFRAA 57 (L) 02/27/2020   CALCIUM 9.4 12/26/2022   PHOS 2.6 12/22/2019   PROT 7.2 12/25/2022   ALBUMIN 3.6 12/25/2022   BILITOT 0.6 12/25/2022   ALKPHOS 79 12/25/2022   AST 17 12/25/2022   ALT 13 12/25/2022   ANIONGAP 9 12/26/2022    GFR: Estimated Creatinine Clearance: 26.8 mL/min (A) (by C-G formula based on SCr of 2.28 mg/dL (H)).  Recent Results (from the past 240 hour(s))  Resp panel by RT-PCR (RSV, Flu A&B, Covid) Anterior Nasal Swab     Status: None   Collection Time: 12/25/22  4:47 AM   Specimen: Anterior Nasal Swab  Result Value Ref Range Status   SARS Coronavirus 2 by RT PCR NEGATIVE NEGATIVE Final   Influenza A by PCR NEGATIVE NEGATIVE Final   Influenza B by PCR NEGATIVE NEGATIVE Final    Comment: (NOTE) The Xpert Xpress SARS-CoV-2/FLU/RSV plus assay is intended as an aid in the diagnosis of influenza from Nasopharyngeal swab specimens and should not be used as a sole basis for treatment. Nasal washings and aspirates are unacceptable for Xpert Xpress SARS-CoV-2/FLU/RSV testing.  Fact Sheet for Patients: BloggerCourse.com  Fact Sheet for Healthcare Providers: SeriousBroker.it  This test is not yet approved or cleared by the [place] FDA and has been authorized for detection and/or  diagnosis of SARS-CoV-2 by FDA under an Emergency Use Authorization (EUA). This EUA will remain in effect (meaning this test can be used) for the duration of the COVID-19 declaration under Section 564(b)(1) of the Act, 21 U.S.C. section 360bbb-3(b)(1), unless the authorization is terminated or revoked.     Resp Syncytial Virus by PCR NEGATIVE NEGATIVE Final    Comment: (NOTE) Fact Sheet for Patients: BloggerCourse.com  Fact Sheet for Healthcare Providers: SeriousBroker.it  This test is not yet approved or cleared by the [place] FDA and has been authorized for detection and/or diagnosis of SARS-CoV-2 by FDA under an Emergency Use Authorization (EUA). This EUA will remain in effect (meaning this test can be used) for the duration of the COVID-19 declaration under Section 564(b)(1) of the Act, 21 U.S.C. section 360bbb-3(b)(1), unless the authorization is terminated or revoked.  Performed at [facility], [address], [place], [place] [address]   Blood culture (routine x 2)     Status: None (Preliminary result)   Collection Time: 12/25/22  6:12 AM   Specimen: BLOOD RIGHT HAND  Result Value Ref Range Status   Specimen Description BLOOD RIGHT HAND  Final   Special Requests   Final    BOTTLES DRAWN AEROBIC ONLY Blood Culture adequate volume   Culture   Final    NO GROWTH 1 DAY Performed at [facility], [address] [place], [place] [address]    Report Status PENDING  Incomplete  Blood  culture (routine x 2)     Status: None (Preliminary result)   Collection Time: 12/25/22  6:25 AM   Specimen: BLOOD  Result Value Ref Range Status   Specimen Description BLOOD SITE NOT SPECIFIED  Final   Special Requests   Final    BOTTLES DRAWN AEROBIC AND ANAEROBIC Blood Culture adequate volume   Culture   Final    NO GROWTH 1 DAY Performed at [facility], [address], [place], [place] [address]    Report  Status PENDING  Incomplete      Radiology Studies: CT ABDOMEN PELVIS WO CONTRAST  Result Date: 12/25/2022 CLINICAL DATA:  Diarrhea, abdominal pain, nausea vomiting. Diarrhea. EXAM: CT ABDOMEN AND PELVIS WITHOUT CONTRAST TECHNIQUE: Multidetector CT imaging of the abdomen and pelvis was performed following the standard protocol without IV contrast. RADIATION DOSE REDUCTION: This exam was performed according to the departmental dose-optimization program which includes automated exposure control, adjustment of the mA and/or kV according to patient size and/or use of iterative reconstruction technique. COMPARISON:  12/29/2021 FINDINGS: Lower chest: Moderate cardiomegaly. Descending thoracic aortic atheromatous vascular calcification. Emphysema. Mild dependent subsegmental atelectasis in both lower lobes. Postoperative findings in the left breast. Hepatobiliary: Layering gallstones in the gallbladder. Otherwise unremarkable. Pancreas: Unremarkable Spleen: Unremarkable Adrenals/Urinary Tract: Left urinary bladder and left distal ureter partially obscured by streak artifact from patient's left hip hardware. Adrenal glands unremarkable. Hypodense exophytic lesion left kidney lower pole with density of 4 Hounsfield units. Hypodense lesion of the left mid kidney with internal density 6 Hounsfield units. These are compatible with simple cysts. No further imaging workup of these lesions is indicated. Stomach/Bowel: Diverticulosis of the distal transverse, descending, and proximal sigmoid colon without findings of active diverticulitis. Normal appendix. No dilated bowel. Vascular/Lymphatic: Atherosclerosis is present, including aortoiliac atherosclerotic disease. There is atheromatous plaque at the origins of the celiac trunk and superior mesenteric artery. Reproductive: Difficult to characterize on noncontrast CT but the endometrial stripe may be up to 1.3 cm thickness, which would be abnormal in the postmenopausal  setting. Pelvic sonography revealed fluid and wall thickening along the endometrium in 10/14/2020, potentially this is already been previously worked up, correlate with previous workup results. Other: No supplemental non-categorized findings. Musculoskeletal: Left proximal femoral nail system noted. Deformity from old healed left inferior pubic ramus fracture observed. 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 Lower thoracic and lumbar spondylosis observed, causing left greater than right foraminal impingement at L4-5 at L5-S1. IMPRESSION: 1. Abnormal endometrial thickening. This appears to be followed on serial pelvic ultrasounds at the [facility], most recently 09/17/2021. On the basis of today's CT scan, endometrial carcinoma cannot be excluded, but endometrial thickening and fluid has been shown on the outside ultrasounds. Correlate with previous workups in determining whether any further workup such as pelvic ultrasound is currently indicated. 2. Diverticulosis of the distal transverse, descending, and proximal sigmoid colon without findings of active diverticulitis. 3. Cholelithiasis. 4. Moderate cardiomegaly. 5. Emphysema. 6. Lower thoracic and lumbar spondylosis, causing left greater than right foraminal impingement at L4-5 and L5-S1. 7. 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 Aortic Atherosclerosis (ICD10-I70.0) and Emphysema (ICD10-J43.9). Electronically Signed   By: Gaylyn Rong M.D.   On: 12/25/2022 14:38   DG Chest Port 1 View  Result Date: 12/25/2022 CLINICAL DATA:  77 year old female with history of shortness  of breath. EXAM: PORTABLE CHEST 1 VIEW COMPARISON:  Chest x-ray 10/01/2020. FINDINGS: Lung volumes are low. Elevated left hemidiaphragm. Diffuse interstitial prominence and  peribronchial cuffing, similar to prior studies. New opacity at the left base which may reflect atelectasis and/or consolidation, with superimposed small left pleural effusion. No right pleural effusion. No pneumothorax. Suture lines are evident in the upper lobes of the lungs bilaterally, potentially from prior wedge resection or blebectomy. No evidence of pulmonary edema. Heart size appears borderline enlarged. The patient is rotated to the right on today's exam, resulting in distortion of the mediastinal contours and reduced diagnostic sensitivity and specificity for mediastinal pathology. Atherosclerotic calcifications in the thoracic aorta. Status post median sternotomy. IMPRESSION: 1. New area of atelectasis and/or consolidation in the left lung base with small left pleural effusion. 2. Chronic elevation of the left hemidiaphragm redemonstrated. 3. Diffuse interstitial prominence and peribronchial cuffing which appears chronic, suggesting possible chronic bronchitis. 4. Aortic atherosclerosis. Electronically Signed   By: Trudie Reed M.D.   On: 12/25/2022 05:29      LOS: 1 day    Jacquelin Hawking, MD [facility] 12/26/2022, 9:47 AM   If 7PM-7AM, please contact night-coverage [email or web address]

## 2022-12-27 ENCOUNTER — Inpatient Hospital Stay: Payer: Medicare HMO | Attending: Hematology

## 2022-12-27 ENCOUNTER — Inpatient Hospital Stay (HOSPITAL_COMMUNITY): Payer: Medicare HMO

## 2022-12-27 ENCOUNTER — Encounter (HOSPITAL_COMMUNITY): Payer: Self-pay | Admitting: Internal Medicine

## 2022-12-27 DIAGNOSIS — N179 Acute kidney failure, unspecified: Secondary | ICD-10-CM | POA: Diagnosis not present

## 2022-12-27 DIAGNOSIS — I5021 Acute systolic (congestive) heart failure: Secondary | ICD-10-CM

## 2022-12-27 DIAGNOSIS — J9601 Acute respiratory failure with hypoxia: Secondary | ICD-10-CM | POA: Diagnosis not present

## 2022-12-27 DIAGNOSIS — R6510 Systemic inflammatory response syndrome (SIRS) of non-infectious origin without acute organ dysfunction: Secondary | ICD-10-CM | POA: Diagnosis not present

## 2022-12-27 DIAGNOSIS — I2699 Other pulmonary embolism without acute cor pulmonale: Secondary | ICD-10-CM | POA: Insufficient documentation

## 2022-12-27 DIAGNOSIS — R112 Nausea with vomiting, unspecified: Secondary | ICD-10-CM | POA: Diagnosis not present

## 2022-12-27 LAB — ECHOCARDIOGRAM COMPLETE
AR max vel: 1.1 cm2
AV Area VTI: 1.08 cm2
AV Area mean vel: 1.07 cm2
AV Mean grad: 12.5 mm[Hg]
AV Peak grad: 22.7 mm[Hg]
Ao pk vel: 2.38 m/s
Height: 67 in
S' Lateral: 3.2 cm
Weight: 4010.61 [oz_av]

## 2022-12-27 LAB — APTT
aPTT: 91 s — ABNORMAL HIGH (ref 24–36)
aPTT: 88 s — ABNORMAL HIGH (ref 24–36)

## 2022-12-27 LAB — BASIC METABOLIC PANEL
Anion gap: 6 (ref 5–15)
BUN: 25 mg/dL — ABNORMAL HIGH (ref 8–23)
CO2: 20 mmol/L — ABNORMAL LOW (ref 22–32)
Calcium: 8.5 mg/dL — ABNORMAL LOW (ref 8.9–10.3)
Chloride: 113 mmol/L — ABNORMAL HIGH (ref 98–111)
Creatinine, Ser: 1.54 mg/dL — ABNORMAL HIGH (ref 0.44–1.00)
GFR, Estimated: 35 mL/min — ABNORMAL LOW (ref >60)
Glucose, Bld: 89 mg/dL (ref 70–99)
Potassium: 3.9 mmol/L (ref 3.5–5.1)
Sodium: 139 mmol/L (ref 135–145)

## 2022-12-27 LAB — CBC
HCT: 33.7 % — ABNORMAL LOW (ref 36.0–46.0)
Hemoglobin: 11 g/dL — ABNORMAL LOW (ref 12.0–15.0)
MCH: 31.2 pg (ref 26.0–34.0)
MCHC: 32.6 g/dL (ref 30.0–36.0)
MCV: 95.5 fL (ref 80.0–100.0)
Platelets: 75 10*3/uL — ABNORMAL LOW (ref 150–400)
RBC: 3.53 MIL/uL — ABNORMAL LOW (ref 3.87–5.11)
RDW: 13.3 % (ref 11.5–15.5)
WBC: 12.6 10*3/uL — ABNORMAL HIGH (ref 4.0–10.5)
nRBC: 0 % (ref 0.0–0.2)

## 2022-12-27 MED ORDER — TECHNETIUM TO 99M ALBUMIN AGGREGATED
4.3000 | Freq: Once | INTRAVENOUS | Status: AC | PRN
  Administered 2022-12-27: 4.3 via INTRAVENOUS

## 2022-12-27 MED ORDER — INFLUENZA VAC A&B SURF ANT ADJ 0.5 ML IM SUSY
0.5000 mL | PREFILLED_SYRINGE | INTRAMUSCULAR | Status: AC
  Administered 2022-12-28: 0.5 mL via INTRAMUSCULAR
  Filled 2022-12-27: qty 0.5

## 2022-12-27 MED ORDER — AZITHROMYCIN 250 MG PO TABS
500.0000 mg | ORAL_TABLET | Freq: Every day | ORAL | Status: AC
  Administered 2022-12-28 – 2022-12-29 (×2): 500 mg via ORAL
  Filled 2022-12-27 (×2): qty 2

## 2022-12-27 MED ORDER — CEFDINIR 300 MG PO CAPS
300.0000 mg | ORAL_CAPSULE | Freq: Two times a day (BID) | ORAL | Status: AC
  Administered 2022-12-28 – 2022-12-29 (×3): 300 mg via ORAL
  Filled 2022-12-27 (×3): qty 1

## 2022-12-27 MED ORDER — CARVEDILOL 25 MG PO TABS
25.0000 mg | ORAL_TABLET | Freq: Two times a day (BID) | ORAL | Status: DC
  Administered 2022-12-27 – 2023-01-19 (×45): 25 mg via ORAL
  Filled 2022-12-27 (×46): qty 1

## 2022-12-27 MED ORDER — PERFLUTREN LIPID MICROSPHERE
1.0000 mL | INTRAVENOUS | Status: AC | PRN
  Administered 2022-12-27: 8 mL via INTRAVENOUS

## 2022-12-27 MED ORDER — SPIRONOLACTONE 25 MG PO TABS: 25.0000 mg | ORAL_TABLET | Freq: Every day | ORAL | Status: DC

## 2022-12-27 MED ORDER — LOSARTAN POTASSIUM 25 MG PO TABS
25.0000 mg | ORAL_TABLET | Freq: Every day | ORAL | Status: DC
  Administered 2022-12-27 – 2023-01-19 (×24): 25 mg via ORAL
  Filled 2022-12-27 (×24): qty 1

## 2022-12-27 NOTE — Progress Notes (Addendum)
None (Preliminary result)   Collection Time: 12/25/22  6:12 AM   Specimen: BLOOD RIGHT HAND  Result Value Ref Range Status   Specimen Description BLOOD RIGHT HAND  Final   Special Requests   Final    BOTTLES DRAWN AEROBIC ONLY Blood Culture adequate volume   Culture   Final    NO GROWTH 2  DAYS Performed at [facility], [address], [place], [place] [address]    Report Status PENDING  Incomplete  Blood culture (routine x 2)     Status: None (Preliminary result)   Collection Time: 12/25/22  6:25 AM   Specimen: BLOOD  Result Value Ref Range Status   Specimen Description BLOOD SITE NOT SPECIFIED  Final   Special Requests   Final    BOTTLES DRAWN AEROBIC AND ANAEROBIC Blood Culture adequate volume   Culture   Final    NO GROWTH 2 DAYS Performed at [facility], [address] [place], [place] [address]    Report Status PENDING  Incomplete      Radiology Studies: NM Pulmonary Perf and Vent  Result Date: 12/27/2022 CLINICAL DATA:  Shortness of breath.  Hypoxia. EXAM: NUCLEAR MEDICINE PERFUSION LUNG SCAN TECHNIQUE: Perfusion images were obtained in multiple projections after intravenous injection of radiopharmaceutical. Ventilation scans intentionally deferred if perfusion scan and chest x-ray adequate for interpretation during COVID 19 epidemic. RADIOPHARMACEUTICALS:  0.3 mCi Tc-52m MAA IV COMPARISON:  One-view chest x-ray 12/27/2022 FINDINGS: A wedge-shaped perfusion defect is present in the superior segment of the right lower lobe. No other defects are present. IMPRESSION: Pulmonary embolism present. Electronically Signed   By: Marin Roberts M.D.   On: 12/27/2022 14:36   ECHOCARDIOGRAM COMPLETE  Result Date: 12/27/2022    ECHOCARDIOGRAM REPORT   Patient Name:   Danielle [name] Date of Exam: 12/27/2022 Medical Rec #:  [id number]                   Height:       67.0 in Accession #:    [id number]                  Weight:       250.7 lb Date of Birth:  [birth year]                  BSA:          2.226 m Patient Age:    76 years                    BP:           108/69 mmHg Patient Gender: F                           HR:           93 bpm. Exam Location:  Inpatient Procedure: 2D Echo, Color Doppler, Cardiac Doppler and Intracardiac             Opacification Agent Indications:    I50.21 Acute systolic (congestive) heart failure  History:        Patient has prior history of Echocardiogram examinations, most                 recent 01/07/2022. CHF, COPD, Arrythmias:Atrial Fibrillation;                 Risk Factors:Hypertension and Dyslipidemia.  None (Preliminary result)   Collection Time: 12/25/22  6:12 AM   Specimen: BLOOD RIGHT HAND  Result Value Ref Range Status   Specimen Description BLOOD RIGHT HAND  Final   Special Requests   Final    BOTTLES DRAWN AEROBIC ONLY Blood Culture adequate volume   Culture   Final    NO GROWTH 2  DAYS Performed at [facility], [address], [place], [place] [address]    Report Status PENDING  Incomplete  Blood culture (routine x 2)     Status: None (Preliminary result)   Collection Time: 12/25/22  6:25 AM   Specimen: BLOOD  Result Value Ref Range Status   Specimen Description BLOOD SITE NOT SPECIFIED  Final   Special Requests   Final    BOTTLES DRAWN AEROBIC AND ANAEROBIC Blood Culture adequate volume   Culture   Final    NO GROWTH 2 DAYS Performed at [facility], [address] [place], [place] [address]    Report Status PENDING  Incomplete      Radiology Studies: NM Pulmonary Perf and Vent  Result Date: 12/27/2022 CLINICAL DATA:  Shortness of breath.  Hypoxia. EXAM: NUCLEAR MEDICINE PERFUSION LUNG SCAN TECHNIQUE: Perfusion images were obtained in multiple projections after intravenous injection of radiopharmaceutical. Ventilation scans intentionally deferred if perfusion scan and chest x-ray adequate for interpretation during COVID 19 epidemic. RADIOPHARMACEUTICALS:  0.3 mCi Tc-52m MAA IV COMPARISON:  One-view chest x-ray 12/27/2022 FINDINGS: A wedge-shaped perfusion defect is present in the superior segment of the right lower lobe. No other defects are present. IMPRESSION: Pulmonary embolism present. Electronically Signed   By: Marin Roberts M.D.   On: 12/27/2022 14:36   ECHOCARDIOGRAM COMPLETE  Result Date: 12/27/2022    ECHOCARDIOGRAM REPORT   Patient Name:   Danielle [name] Date of Exam: 12/27/2022 Medical Rec #:  [id number]                   Height:       67.0 in Accession #:    [id number]                  Weight:       250.7 lb Date of Birth:  [birth year]                  BSA:          2.226 m Patient Age:    76 years                    BP:           108/69 mmHg Patient Gender: F                           HR:           93 bpm. Exam Location:  Inpatient Procedure: 2D Echo, Color Doppler, Cardiac Doppler and Intracardiac             Opacification Agent Indications:    I50.21 Acute systolic (congestive) heart failure  History:        Patient has prior history of Echocardiogram examinations, most                 recent 01/07/2022. CHF, COPD, Arrythmias:Atrial Fibrillation;                 Risk Factors:Hypertension and Dyslipidemia.  PROGRESS NOTE    Danielle [name]  WUJ:[id number] DOB: 1945/08/03 DOA: 12/25/2022 PCP: [place], [name], MD   Brief Narrative: Danielle [name] is a 77 y.o. female with a history of hypertension, hyperlipidemia, CAD, heart failure with reduced EF, paroxysmal atrial fibrillation, COPD, hyperthyroidism, thrombocytopenia, GERD, obesity.  Patient presented secondary to shortness of breath with hypoxia and found to have evidence of sepsis with presumed pneumonia. Also concern for possible acute PE, started on Heparin IV. Blood cultures obtained and antibiotics started. V/Q scan obtained on 10/28 and confirms present of a pulmonary embolism.   Assessment and Plan:  Sepsis Present on admission with presumed pneumonia. Blood cultures obtained. Antibiotics initiated for pneumonia. -Continue antibiotics -Follow-up blood cultures  Presumed community acquired pneumonia Patient with hypoxia and fever with x-ray evidence suggesting possible left lung base infection. Patient started empirically on Ceftriaxone and azithromycin. -Transition to Cefdinir and Azithromycin PO  COPD exacerbation Present on admission. Patient treated with steroids and breathing treatments. No wheezing noted. -Discontinue prednisone -Continue Duonebs, Brovana and budesonide  Acute respiratory failure with hypoxia Presumed secondary to COPD exacerbation versus possible pneumonia. Patient has required up to non-rebreather, now down to 4 L/min of oxygen. Concern for possible PE as well; heparin IV started and V/Q scan ordered and confirms PE. Patient weaned to room air at rest -Ambulatory pulse ox prior to discharge  Acute pulmonary embolism Patient with elevated D-dimer measuring >20. V/Q scan confirms present of a pulmonary embolism. Associated demand ischemia that could indicate a level of heart strain. Initial Transthoracic Echocardiogram did not well visualize the RV. -Continue Heparin pending RV  evaluation on Transthoracic Echocardiogram; likely transition to Eliquis in AM  Elevated troponin Presumed demand ischemia. No chest pain. Transthoracic Echocardiogram ordered and shows slightly more reduced LVEF of 40-45%.  Likely secondary to pulmonary embolism.  AKI on CKD stage IIIb Baseline creatinine of about 1.6. Creatinine of 3.13 on admission with peak of 3.40. Creatinine improved to 1.54. AKI resolved.  Nausea/vomiting/diarrhea CT abdomen/pelvis without evidence of acute inflammation.  Primary hypertension Noted. Patient is on spironolactone and Coreg as an outpatient.  Paroxysmal atrial fibrillation Noted. Patient is on Coreg and Eliquis as an outpatient.  Chronic heart failure with reduced ejection fracture Last LVEF from 2023 significant for an LVEF of 50% with grade 1 diastolic dysfunction. Patient is on Coreg and spironolactone Transthoracic Echocardiogram ordered this admission and is significant for newly reduced LVEF of 40-45%. Patient will need to follow-up with cardiology as an outpatient. -Resume home Coreg -Start losartan  Hyperthyroidism TSH and free T4 within normal limits.  Thrombocytopenia Chronic. Stable.  Aortic atherosclerosis Noted on imaging.  Obesity Estimated body mass index is 39.26 kg/m as calculated from the following:   Height as of this encounter: 5\' 7"  (1.702 m).   Weight as of this encounter: 113.7 kg.   DVT prophylaxis: Heparin IV Code Status:   Code Status: Do not attempt resuscitation (DNR) PRE-ARREST INTERVENTIONS DESIRED Family Communication: None at bedside Disposition Plan: Discharge home likely in 1-3 days pending improvement of hypoxia, transition to oral antibiotics   Consultants:  None  Procedures:  None  Antimicrobials: Ceftriaxone Azithromycin    Subjective: Patient reports breathing better. No issues from overnight.  Objective: BP (!) 135/100   Pulse (!) 121   Temp 98.3 F (36.8 C) (Oral)   Resp (!)  25   Ht 5\' 7"  (1.702 m)   Wt 113.7 kg   SpO2 97%   BMI 39.26 kg/m   Examination:  None (Preliminary result)   Collection Time: 12/25/22  6:12 AM   Specimen: BLOOD RIGHT HAND  Result Value Ref Range Status   Specimen Description BLOOD RIGHT HAND  Final   Special Requests   Final    BOTTLES DRAWN AEROBIC ONLY Blood Culture adequate volume   Culture   Final    NO GROWTH 2  DAYS Performed at [facility], [address], [place], [place] [address]    Report Status PENDING  Incomplete  Blood culture (routine x 2)     Status: None (Preliminary result)   Collection Time: 12/25/22  6:25 AM   Specimen: BLOOD  Result Value Ref Range Status   Specimen Description BLOOD SITE NOT SPECIFIED  Final   Special Requests   Final    BOTTLES DRAWN AEROBIC AND ANAEROBIC Blood Culture adequate volume   Culture   Final    NO GROWTH 2 DAYS Performed at [facility], [address] [place], [place] [address]    Report Status PENDING  Incomplete      Radiology Studies: NM Pulmonary Perf and Vent  Result Date: 12/27/2022 CLINICAL DATA:  Shortness of breath.  Hypoxia. EXAM: NUCLEAR MEDICINE PERFUSION LUNG SCAN TECHNIQUE: Perfusion images were obtained in multiple projections after intravenous injection of radiopharmaceutical. Ventilation scans intentionally deferred if perfusion scan and chest x-ray adequate for interpretation during COVID 19 epidemic. RADIOPHARMACEUTICALS:  0.3 mCi Tc-52m MAA IV COMPARISON:  One-view chest x-ray 12/27/2022 FINDINGS: A wedge-shaped perfusion defect is present in the superior segment of the right lower lobe. No other defects are present. IMPRESSION: Pulmonary embolism present. Electronically Signed   By: Marin Roberts M.D.   On: 12/27/2022 14:36   ECHOCARDIOGRAM COMPLETE  Result Date: 12/27/2022    ECHOCARDIOGRAM REPORT   Patient Name:   Danielle [name] Date of Exam: 12/27/2022 Medical Rec #:  [id number]                   Height:       67.0 in Accession #:    [id number]                  Weight:       250.7 lb Date of Birth:  [birth year]                  BSA:          2.226 m Patient Age:    76 years                    BP:           108/69 mmHg Patient Gender: F                           HR:           93 bpm. Exam Location:  Inpatient Procedure: 2D Echo, Color Doppler, Cardiac Doppler and Intracardiac             Opacification Agent Indications:    I50.21 Acute systolic (congestive) heart failure  History:        Patient has prior history of Echocardiogram examinations, most                 recent 01/07/2022. CHF, COPD, Arrythmias:Atrial Fibrillation;                 Risk Factors:Hypertension and Dyslipidemia.  None (Preliminary result)   Collection Time: 12/25/22  6:12 AM   Specimen: BLOOD RIGHT HAND  Result Value Ref Range Status   Specimen Description BLOOD RIGHT HAND  Final   Special Requests   Final    BOTTLES DRAWN AEROBIC ONLY Blood Culture adequate volume   Culture   Final    NO GROWTH 2  DAYS Performed at [facility], [address], [place], [place] [address]    Report Status PENDING  Incomplete  Blood culture (routine x 2)     Status: None (Preliminary result)   Collection Time: 12/25/22  6:25 AM   Specimen: BLOOD  Result Value Ref Range Status   Specimen Description BLOOD SITE NOT SPECIFIED  Final   Special Requests   Final    BOTTLES DRAWN AEROBIC AND ANAEROBIC Blood Culture adequate volume   Culture   Final    NO GROWTH 2 DAYS Performed at [facility], [address] [place], [place] [address]    Report Status PENDING  Incomplete      Radiology Studies: NM Pulmonary Perf and Vent  Result Date: 12/27/2022 CLINICAL DATA:  Shortness of breath.  Hypoxia. EXAM: NUCLEAR MEDICINE PERFUSION LUNG SCAN TECHNIQUE: Perfusion images were obtained in multiple projections after intravenous injection of radiopharmaceutical. Ventilation scans intentionally deferred if perfusion scan and chest x-ray adequate for interpretation during COVID 19 epidemic. RADIOPHARMACEUTICALS:  0.3 mCi Tc-52m MAA IV COMPARISON:  One-view chest x-ray 12/27/2022 FINDINGS: A wedge-shaped perfusion defect is present in the superior segment of the right lower lobe. No other defects are present. IMPRESSION: Pulmonary embolism present. Electronically Signed   By: Marin Roberts M.D.   On: 12/27/2022 14:36   ECHOCARDIOGRAM COMPLETE  Result Date: 12/27/2022    ECHOCARDIOGRAM REPORT   Patient Name:   Danielle [name] Date of Exam: 12/27/2022 Medical Rec #:  [id number]                   Height:       67.0 in Accession #:    [id number]                  Weight:       250.7 lb Date of Birth:  [birth year]                  BSA:          2.226 m Patient Age:    76 years                    BP:           108/69 mmHg Patient Gender: F                           HR:           93 bpm. Exam Location:  Inpatient Procedure: 2D Echo, Color Doppler, Cardiac Doppler and Intracardiac             Opacification Agent Indications:    I50.21 Acute systolic (congestive) heart failure  History:        Patient has prior history of Echocardiogram examinations, most                 recent 01/07/2022. CHF, COPD, Arrythmias:Atrial Fibrillation;                 Risk Factors:Hypertension and Dyslipidemia.

## 2022-12-27 NOTE — TOC Initial Note (Signed)
Transition of Care [facility]) - Initial/Assessment Note    Patient Details  Name: Danielle [name] MRN: [id number] Date of Birth: 12/18/1945  Transition of Care [facility]) CM/SW Contact:    Ronny Bacon, RN Phone Number: 12/27/2022, 2:24 PM  Clinical Narrative:  Patient is from home. PT/OT recommendations noted. HH PT/OT arranged with [facility].                  Expected Discharge Plan: Home w Home Health Services Barriers to Discharge: Continued Medical Work up   Patient Goals and CMS Choice            Expected Discharge Plan and Services                                   HH Arranged: PT, OT HH Agency: [facility] Date [facility] Agency Contacted: 12/27/22 Time HH Agency Contacted: 1423 Representative spoke with at [facility] Agency: Kandee Keen  Prior Living Arrangements/Services     Patient language and need for interpreter reviewed:: Yes Do you feel safe going back to the place where you live?: Yes      Need for Family Participation in Patient Care: Yes (Comment) Care giver support system in place?: Yes (comment)   Criminal Activity/Legal Involvement Pertinent to Current Situation/Hospitalization: No - Comment as needed  Activities of Daily Living   ADL Screening (condition at time of admission) Independently performs ADLs?: No Does the patient have a NEW difficulty with bathing/dressing/toileting/self-feeding that is expected to last >3 days?: No Does the patient have a NEW difficulty with getting in/out of bed, walking, or climbing stairs that is expected to last >3 days?: No Does the patient have a NEW difficulty with communication that is expected to last >3 days?: No Is the patient deaf or have difficulty hearing?: No Does the patient have difficulty seeing, even when wearing glasses/contacts?: No Does the patient have difficulty concentrating, remembering, or making decisions?: Yes (difficulty remembering)  Permission Sought/Granted                   Emotional Assessment   Attitude/Demeanor/Rapport: Engaged   Orientation: : Oriented to Self, Oriented to Place, Oriented to  Time, Oriented to Situation Alcohol / Substance Use: Not Applicable Psych Involvement: No (comment)  Admission diagnosis:  Acute respiratory failure with hypoxia (HCC) [J96.01] AKI (acute kidney injury) (HCC) [N17.9] Patient Active Problem List   Diagnosis Date Noted   Acute respiratory failure with hypoxia (HCC) 12/25/2022   SIRS (systemic inflammatory response syndrome) (HCC) 12/25/2022   Elevated troponin 12/25/2022   Acute kidney injury superimposed on chronic kidney disease (HCC) 12/25/2022   Nausea, vomiting, and diarrhea 12/25/2022   Obesity (BMI 30-39.9) 12/25/2022   Gastroesophageal reflux disease 04/09/2022   Abdominal pain, periumbilical 04/09/2022   Chronic pain of both shoulders 12/17/2021   Depression, major, single episode, moderate (HCC) 12/08/2020   Gait abnormality 07/31/2020   Paresthesia 07/31/2020   Low back pain with bilateral sciatica 07/31/2020   Pneumonia due to COVID-19 virus    Bacteremia due to Gram-positive bacteria 08/29/2018   Abscess    Cellulitis of left breast 08/28/2018   Cellulitis of left abdominal wall 08/28/2018   Routine general medical examination at a health care facility 04/28/2018   Stage 3b chronic kidney disease (HCC) 09/27/2017   Recurrent genital herpes 04/13/2017   Chronic pain disorder 01/25/2017   Malignant neoplasm of upper-outer quadrant of left breast  in female, estrogen receptor positive (HCC) 12/16/2016   Chronic knee pain 12/07/2016   Chronic bilateral low back pain without sciatica 12/07/2016   Peripheral musculoskeletal gait disorder 12/07/2016   Encounter for therapeutic drug monitoring 06/16/2016   Generalized osteoarthritis of multiple sites 05/04/2016   Aortic valve disease 04/10/2016   S/P AVR 04/10/2016   NICM (nonischemic cardiomyopathy) (HCC) 04/10/2016   Upper airway cough  syndrome 03/14/2016   Class 2 severe obesity with serious comorbidity in adult [facility]) 03/14/2016   COPD (chronic obstructive pulmonary disease) (HCC) 03/11/2016   Thrombocytopenia (HCC) 12/12/2015   Anemia 12/12/2015   Vitamin D deficiency 12/12/2015   Acute urinary retention 12/11/2015   Osteoporosis 12/11/2015   Hip fracture (HCC) 12/10/2015   Aortic atherosclerosis (HCC) 12/10/2015   Lung blebs (HCC) 12/03/2015   Pelvic fracture (HCC) 08/19/2015   Fall at home 08/19/2015   Essential hypertension 08/19/2015   Hyperlipidemia LDL goal <70 08/19/2015   Asthma 08/19/2015   Paroxysmal atrial fibrillation (HCC) 08/19/2015   Hyperthyroidism 08/19/2015   Chronic HFrEF (heart failure with reduced ejection fraction) (HCC)    Coronary artery disease    Aortic stenosis    PCP:  [place], [name], MD Pharmacy:   [facility] [address] ([place]), [place] - [address] [place]) [place] [address] Phone: [phone] Fax: [phone]  [facility]) - [place], [place] - [address] [facility] [address] [place] [address] [place] [address] Phone: [phone] Fax: [phone]  [facility] - [place], [place] - [address] [place]  [address] Phone: [phone] Fax: [phone]  [place] - [facility] [address] [place] [address] Phone: [phone] Fax: [phone]  [facility] - [place], [place] - [address] [place] [address] Phone: [phone] Fax: [phone]  [facility] [place], [place] - [address] [place] [address] Phone: [phone] Fax: [phone]  [facility] (Now [facility]) - [address] [place], [place] - [address] [place] [address] Phone: [phone] Fax:  [phone]  [facility] - [place], [place] - [address] [place] [address] Phone: [phone] Fax: [phone]  [facility] - [facility] [address] [place] [address] Phone: [phone] Fax: [phone]     Social Determinants of Health (SDOH) Social History: SDOH Screenings   Food Insecurity: No Food Insecurity (12/25/2022)  Housing: Low Risk  (12/25/2022)  Transportation Needs: No Transportation Needs (12/25/2022)  Utilities: Not At Risk (12/25/2022)  Alcohol Screen: Low Risk  (02/01/2022)  Depression (PHQ2-9): Low Risk  (06/04/2022)  Recent Concern: Depression (PHQ2-9) - Medium Risk (04/09/2022)  Financial Resource Strain: Low Risk  (02/01/2022)  Recent Concern: Financial Resource Strain - Medium Risk (01/25/2022)  Physical Activity: Inactive (02/01/2022)  Social Connections: Socially Integrated (02/01/2022)  Stress: No Stress Concern Present (02/01/2022)  Tobacco Use: Medium Risk (12/27/2022)   SDOH Interventions:     Readmission Risk Interventions     No data to display

## 2022-12-27 NOTE — Progress Notes (Signed)
Mobility Specialist Progress Note:   12/27/22 1500  Mobility  Activity Ambulated with assistance in room;Transferred to/from [facility]  Level of Assistance Minimal assist, patient does 75% or more  Assistive Device Four wheel walker  Distance Ambulated (ft) 30 ft  Activity Response Tolerated fair  Mobility Referral Yes  $Mobility charge 1 Mobility  Mobility Specialist Start Time (ACUTE ONLY) 1420  Mobility Specialist Stop Time (ACUTE ONLY) 1444  Mobility Specialist Time Calculation (min) (ACUTE ONLY) 24 min    Pre Mobility: 114 HR,  92% SpO2 RA  Pt received in bed, agreeable to mobility. Had DOE upon ambulating to door and took x1 seated rest break, practicing pursed lipped breathing. Upon returning to bed, pt desat to 87% SpO2, requiring 2L O2 to return [facility]. Pt then requested to use BSC. Asymptomatic throughout transfer. Pt left on BSC with call bell in hand and NT aware.  D'Vante Earlene Plater Mobility Specialist Please contact via Special educational needs teacher or Rehab office at [phone]

## 2022-12-27 NOTE — Progress Notes (Signed)
PHARMACY - ANTICOAGULATION CONSULT NOTE  Pharmacy Consult for heparin Indication: r/o PE  Labs: Recent Labs    12/25/22 0447 12/25/22 0454 12/25/22 0632 12/25/22 1428 12/26/22 0043 12/26/22 0420 12/26/22 1347 12/27/22 0158  HGB 14.4 15.3*  --   --   --  12.8  --   --   HCT 44.1 45.0  --   --   --  38.5  --   --   PLT 68*  --   --   --   --  66*  --   --   APTT 33  --   --  >200* >200*  --  198* 91*  LABPROT 14.7  --   --   --   --   --   --   --   INR 1.1  --   --   --   --   --   --   --   HEPARINUNFRC  --   --   --  >1.10* >1.10* >1.10*  --   --   CREATININE 3.13* 3.40*  --   --   --  2.28*  --   --   CKTOTAL  --   --  43  --   --   --   --   --   TROPONINIHS 226*  --  288*  --   --   --  237*  --     Assessment/Plan:  77yo female therapeutic on heparin after rate change. Will continue infusion at current rate of 900 units/hr and confirm stable with additional level.  Vernard Gambles, PharmD, BCPS 12/27/2022 3:41 AM

## 2022-12-27 NOTE — Progress Notes (Signed)
Echocardiogram 2D Echocardiogram has been performed.  Warren Lacy Shakeitha Umbaugh RDCS 12/27/2022, 9:03 AM

## 2022-12-27 NOTE — Care Management Important Message (Signed)
Important Message  Patient Details  Name: Danielle [name] MRN: [id number] Date of Birth: 04/13/1945   Important Message Given:  Yes - Medicare IM     Sherilyn Banker 12/27/2022, 4:32 PM

## 2022-12-27 NOTE — Progress Notes (Signed)
   12/27/22 1200  Spiritual Encounters  Type of Visit Initial  Care provided to: Pt and family  Referral source Patient request  Reason for visit Routine spiritual support  OnCall Visit No   Chaplain responded to request for emotional and spiritual support. The patient's family was present at bedside. Patient wanted chaplain to offer a word of prayer. Chaplain prayed for patient and family. Ch remains available when needed.

## 2022-12-27 NOTE — Progress Notes (Signed)
Heart Failure Navigator Progress Note  Assessed for Heart & Vascular TOC clinic readiness.  Patient does not meet criteria due to Advanced Heart Failure Team patient of Dr. McLean.   Navigator will sign off at this time.   Mahreen Schewe, BSN, RN Heart Failure Nurse Navigator Secure Chat Only   

## 2022-12-27 NOTE — Progress Notes (Signed)
PHARMACY - ANTICOAGULATION CONSULT NOTE  Pharmacy Consult for heparin Indication:  r/o PE  Allergies  Allergen Reactions   Tetanus Toxoid Adsorbed Swelling    Arm swelling   Zestril [Lisinopril] Cough   Peanut (Diagnostic) Cough    Patient Measurements: Height: 5\' 7"  (170.2 cm) Weight: 113.7 kg (250 lb 10.6 oz) IBW/kg (Calculated) : 61.6 Heparin Dosing Weight: 85kg  Vital Signs: Temp: 98.5 F (36.9 C) (10/28 0728) Temp Source: Oral (10/28 0728) BP: 128/92 (10/28 0516) Pulse Rate: 95 (10/28 0728)  Labs: Recent Labs    12/25/22 0447 12/25/22 0454 12/25/22 1478 12/25/22 1428 12/26/22 0043 12/26/22 0420 12/26/22 1347 12/27/22 0158 12/27/22 0639  HGB 14.4 15.3*  --   --   --  12.8  --   --  11.0*  HCT 44.1 45.0  --   --   --  38.5  --   --  33.7*  PLT 68*  --   --   --   --  66*  --   --  75*  APTT 33  --   --  >200* >200*  --  198* 91* 88*  LABPROT 14.7  --   --   --   --   --   --   --   --   INR 1.1  --   --   --   --   --   --   --   --   HEPARINUNFRC  --   --   --  >1.10* >1.10* >1.10*  --   --   --   CREATININE 3.13* 3.40*  --   --   --  2.28*  --   --  1.54*  CKTOTAL  --   --  43  --   --   --   --   --   --   TROPONINIHS 226*  --  288*  --   --   --  237*  --   --     Estimated Creatinine Clearance: 40.4 mL/min (A) (by C-G formula based on SCr of 1.54 mg/dL (H)).   Medical History: Past Medical History:  Diagnosis Date   Anticoagulated on Coumadin    managed by cardiology   Anxiety    Chronic constipation    Chronic systolic CHF (congestive heart failure) (HCC) 2016   cardiologist--- dr end and followed by CHF clinic   CKD (chronic kidney disease), stage III (HCC)    COPD (chronic obstructive pulmonary disease) (HCC)    previous had seen pulmonology --- dr Sherene Sires, note in epic 03-14-2016, dx COPD GOLD 0   Coronary artery disease    cardiologist--- dr Bethena Roys;  last cardiac cath 01-10-20202 mild nonobstructive disease proxLAD;  per pt, had LHC prior to  AVR in [place] 2016   DOE (dyspnea on exertion)    GERD (gastroesophageal reflux disease)    History of 2019 novel coronavirus disease (COVID-19) 12/19/2019   hospital admission w/ covid pneumonia , no intubatiion,  per pt symptoms resolved and back to baseline   History of cerebrovascular accident (CVA) with residual deficit    CVA in 2000 without residuals;  CVA post op AVR 01/ 2016 with residual right hand weakness   History of gastritis    History of pneumothorax    s/p right vats 11-19-2014 and  s/p left vats  12-13-2015   (both spontenaous due to bleb)   Hyperlipidemia    Hypertension    Hyperthyroidism  per pt followed by pcp,  dx approx 2014   Malignant neoplasm of upper-outer quadrant of left breast in female, 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   oncologist-- dr Mosetta Putt--- dx 10/ 2018  ,  s/p left lumptectomy with node dissection;  completed radiation 04-15-2017, no chemo   Multiple thyroid nodules    in care everywhere pt had left thyroid nodule biospy 09-18-2014 benign   Nausea, vomiting, and diarrhea 12/25/2022   NICM (nonischemic cardiomyopathy) (HCC) 2016   2016  ef 30%;  last echo in epic ef 45%   OA (osteoarthritis)    Osteoporosis    Paroxysmal atrial fibrillation [facility])    cardiologist--- dr end   Personal history of radiation therapy    left breast cancer  03-08-2017  to 04-05-2017   Thrombocytopenia [facility])    followed by dr Mosetta Putt   Valvular stenosis, aortic 03/12/2014   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  Pt  is known to have chronic thrombocytopenia with current Plt at 68, followed as outpt by hem/onc.  Repeat aPTT this morning remains therapeutic, CBC stable, VQ scan pending.  Goal of Therapy:  Heparin level 0.3-0.7 units/ml Monitor platelets by anticoagulation protocol: Yes   Plan:  Heparin 900 units/h Daily aPTT, heparin level, CBC  Fredonia Highland, PharmD, BCPS, [facility] Clinical Pharmacist Please check AMION for all [facility] Pharmacy numbers 12/27/2022

## 2022-12-27 NOTE — Evaluation (Signed)
Occupational Therapy Evaluation Patient Details Name: Danielle [name] MRN: [id number] DOB: 1945-12-15 Today's Date: 12/27/2022   History of Present Illness Pt is 77 year old presented [facility]  12/25/22 for acute respiratory failure with hypoxia and copd exacerbation. PMH - anxiety, breast cancer, COPD, CHF, CAD, HTN, and CVA with residual RUE weakness, Lt hip fx with IM nail   Clinical Impression   PTA, pt lives with spouse, typically ambulatory with Rollator at home and able to manage ADLs aside from aide assist with bathing in walk in tub. Pt presents now with deficits in cardiopulmonary endurance, strength, standing balance and memory. Pt requires Min-Mod A for bed mobility, CGA for standing and up to Min A for mobility using RW. Pt requires Min A for UB ADL and Mod A for LB ADLs. Pt fatigued very quickly in standing w/ HR up to 143bpm, 4/4 DOE. O2 briefly to 87% on RA but overall sustained 91-92% despite DOE.  Provided education re: energy conservation, ADL task modifications and increased assist/supervision at DC.       If plan is discharge home, recommend the following: A little help with walking and/or transfers;A lot of help with bathing/dressing/bathroom    Functional Status Assessment  Patient has had a recent decline in their functional status and demonstrates the ability to make significant improvements in function in a reasonable and predictable amount of time.  Equipment Recommendations  None recommended by OT    Recommendations for Other Services       Precautions / Restrictions Precautions Precautions: Fall;Other (comment) Precaution Comments: watch SpO2. HR and SOB Restrictions Weight Bearing Restrictions: No      Mobility Bed Mobility Overal bed mobility: Needs Assistance Bed Mobility: Supine to Sit, Sit to Supine     Supine to sit: Min assist, HOB elevated, Used rails Sit to supine: Mod assist   General bed mobility comments: Min A to scoot  hips to EOB and Mod A to get BLE back to bed    Transfers Overall transfer level: Needs assistance Equipment used: Rolling walker (2 wheels) Transfers: Sit to/from Stand Sit to Stand: Contact guard assist, From elevated surface                  Balance Overall balance assessment: Needs assistance Sitting-balance support: No upper extremity supported, Feet supported Sitting balance-Leahy Scale: Good     Standing balance support: Bilateral upper extremity supported Standing balance-Leahy Scale: Poor                             ADL either performed or assessed with clinical judgement   ADL Overall ADL's : Needs assistance/impaired Eating/Feeding: Independent   Grooming: Contact guard assist;Standing;Wash/dry face   Upper Body Bathing: Set up;Sitting   Lower Body Bathing: Moderate assistance;Sitting/lateral leans;Sit to/from stand Lower Body Bathing Details (indicate cue type and reason): assist for posterior region d/t fatigue in standing. able to wash anterior peri region briefly standing at sink Upper Body Dressing : Set up;Sitting   Lower Body Dressing: Moderate assistance;Sitting/lateral leans;Sit to/from stand   Toilet Transfer: Contact guard assist;Ambulation;Minimal assistance;Rolling walker (2 wheels)   Toileting- Clothing Manipulation and Hygiene: Moderate assistance;Sit to/from stand;Sitting/lateral lean       Functional mobility during ADLs: Contact guard assist;Minimal assistance;Rolling walker (2 wheels);Cueing for safety;Cueing for sequencing General ADL Comments: Limited by significant SOB with activity, cues for RW use/posture. provided energy conservation handout and discussed modification for ADLs  Vision Ability to See in Adequate Light: 0 Adequate Patient Visual Report: No change from baseline Vision Assessment?: No apparent visual deficits     Perception         Praxis         Pertinent Vitals/Pain Pain Assessment Pain  Assessment: No/denies pain     Extremity/Trunk Assessment Upper Extremity Assessment Upper Extremity Assessment: Generalized weakness   Lower Extremity Assessment Lower Extremity Assessment: Defer to PT evaluation   Cervical / Trunk Assessment Cervical / Trunk Assessment: Kyphotic   Communication Communication Communication: No apparent difficulties   Cognition Arousal: Alert Behavior During Therapy: WFL for tasks assessed/performed Overall Cognitive Status: No family/caregiver present to determine baseline cognitive functioning                                 General Comments: questionable memory deficits and general awareness, unable to recall how she met spouse and did not recall RN informing need to know scan results prior to being able to DC     General Comments  on RA on entry with SpO2 97%. questionable desat to 87% on RA with 4/4 DOE but O2 overall 91-92%. HR up to 143bpm with activity    Exercises     Shoulder Instructions      Home Living Family/patient expects to be discharged to:: Private residence Living Arrangements: Spouse/significant other Available Help at Discharge: Family;Available 24 hours/day Type of Home: House Home Access: Level entry     Home Layout: One level     Bathroom Shower/Tub: Other (comment) (walk in tub)   Bathroom Toilet: Standard     Home Equipment: Rollator (4 wheels);Shower seat;BSC/3in1;Wheelchair - manual;Grab bars - toilet          Prior Functioning/Environment Prior Level of Function : Needs assist           ADLs (physical): Bathing Mobility Comments: Amb with rollator. husband walks with her sometimes. uses wheelchair for out of home appointments ADLs Comments: Aide assist with bath in walk in tub        OT Problem List: Decreased strength;Impaired balance (sitting and/or standing);Decreased activity tolerance;Decreased knowledge of use of DME or AE;Decreased cognition;Cardiopulmonary status  limiting activity      OT Treatment/Interventions: Self-care/ADL training;Therapeutic exercise;Energy conservation;DME and/or AE instruction;Therapeutic activities;Patient/family education;Balance training    OT Goals(Current goals can be found in the care plan section) Acute Rehab OT Goals Patient Stated Goal: go home today OT Goal Formulation: With patient Time For Goal Achievement: 01/10/23 Potential to Achieve Goals: Good ADL Goals Pt Will Perform Lower Body Dressing: with supervision;sitting/lateral leans;sit to/from stand;with adaptive equipment Pt Will Transfer to Toilet: with supervision;ambulating Pt/caregiver will Perform Home Exercise Program: Increased strength;With theraband;Both right and left upper extremity;Independently;With written HEP provided Additional ADL Goal #1: Pt to verbalize at least 2 energy conservation strategies to implement with ADLs/mobility  OT Frequency: Min 1X/week    Co-evaluation              AM-PAC OT "6 Clicks" Daily Activity     Outcome Measure Help from another person eating meals?: None Help from another person taking care of personal grooming?: A Little Help from another person toileting, which includes using toliet, bedpan, or urinal?: A Lot Help from another person bathing (including washing, rinsing, drying)?: A Lot Help from another person to put on and taking off regular upper body clothing?: A Little Help from another person to put  on and taking off regular lower body clothing?: A Lot 6 Click Score: 16   End of Session Equipment Utilized During Treatment: Rolling walker (2 wheels) Nurse Communication: Mobility status  Activity Tolerance: Patient limited by fatigue Patient left: in bed;with call bell/phone within reach;with bed alarm set;with nursing/sitter in room  OT Visit Diagnosis: Unsteadiness on feet (R26.81);Other abnormalities of gait and mobility (R26.89);Muscle weakness (generalized) (M62.81)                Time:  4401-0272 OT Time Calculation (min): 25 min Charges:  OT General Charges $OT Visit: 1 Visit OT Evaluation $OT Eval Moderate Complexity: 1 Mod OT Treatments $Self Care/Home Management : 8-22 mins  Bradd Canary, OTR/L Acute Rehab Services Office: [phone]   Lorre Munroe 12/27/2022, 12:13 PM

## 2022-12-28 ENCOUNTER — Telehealth (HOSPITAL_COMMUNITY): Payer: Self-pay | Admitting: Pharmacy Technician

## 2022-12-28 ENCOUNTER — Inpatient Hospital Stay (HOSPITAL_COMMUNITY): Payer: Medicare HMO

## 2022-12-28 ENCOUNTER — Encounter: Payer: Medicare HMO | Attending: Physical Medicine & Rehabilitation | Admitting: Physical Medicine & Rehabilitation

## 2022-12-28 ENCOUNTER — Other Ambulatory Visit (HOSPITAL_COMMUNITY): Payer: Self-pay

## 2022-12-28 DIAGNOSIS — J9601 Acute respiratory failure with hypoxia: Secondary | ICD-10-CM | POA: Diagnosis not present

## 2022-12-28 DIAGNOSIS — R6510 Systemic inflammatory response syndrome (SIRS) of non-infectious origin without acute organ dysfunction: Secondary | ICD-10-CM | POA: Diagnosis not present

## 2022-12-28 DIAGNOSIS — R0602 Shortness of breath: Secondary | ICD-10-CM | POA: Diagnosis not present

## 2022-12-28 DIAGNOSIS — N179 Acute kidney failure, unspecified: Secondary | ICD-10-CM | POA: Diagnosis not present

## 2022-12-28 DIAGNOSIS — R112 Nausea with vomiting, unspecified: Secondary | ICD-10-CM | POA: Diagnosis not present

## 2022-12-28 DIAGNOSIS — I2699 Other pulmonary embolism without acute cor pulmonale: Secondary | ICD-10-CM | POA: Diagnosis not present

## 2022-12-28 LAB — CBC
HCT: 33.3 % — ABNORMAL LOW (ref 36.0–46.0)
Hemoglobin: 10.9 g/dL — ABNORMAL LOW (ref 12.0–15.0)
MCH: 31.4 pg (ref 26.0–34.0)
MCHC: 32.7 g/dL (ref 30.0–36.0)
MCV: 96 fL (ref 80.0–100.0)
Platelets: 72 10*3/uL — ABNORMAL LOW (ref 150–400)
RBC: 3.47 MIL/uL — ABNORMAL LOW (ref 3.87–5.11)
RDW: 13.5 % (ref 11.5–15.5)
WBC: 9.8 10*3/uL (ref 4.0–10.5)
nRBC: 0 % (ref 0.0–0.2)

## 2022-12-28 LAB — APTT: aPTT: 61 s — ABNORMAL HIGH (ref 24–36)

## 2022-12-28 LAB — POTASSIUM: Potassium: 4.4 mmol/L (ref 3.5–5.1)

## 2022-12-28 LAB — ECHOCARDIOGRAM LIMITED
Height: 67 in
Weight: 4049.41 [oz_av]

## 2022-12-28 LAB — HEPARIN LEVEL (UNFRACTIONATED): Heparin Unfractionated: 0.31 [IU]/mL (ref 0.30–0.70)

## 2022-12-28 LAB — MAGNESIUM: Magnesium: 2 mg/dL (ref 1.7–2.4)

## 2022-12-28 MED ORDER — APIXABAN 5 MG PO TABS
10.0000 mg | ORAL_TABLET | Freq: Two times a day (BID) | ORAL | Status: AC
  Administered 2022-12-28 – 2023-01-03 (×13): 10 mg via ORAL
  Filled 2022-12-28 (×14): qty 2

## 2022-12-28 MED ORDER — PERFLUTREN LIPID MICROSPHERE
1.0000 mL | INTRAVENOUS | Status: AC | PRN
  Administered 2022-12-28: 4 mL via INTRAVENOUS

## 2022-12-28 MED ORDER — APIXABAN 5 MG PO TABS
5.0000 mg | ORAL_TABLET | Freq: Two times a day (BID) | ORAL | Status: DC
  Administered 2023-01-03: 10 mg via ORAL
  Administered 2023-01-04 – 2023-01-19 (×31): 5 mg via ORAL
  Filled 2022-12-28 (×31): qty 1

## 2022-12-28 MED ORDER — POLYETHYLENE GLYCOL 3350 17 G PO PACK
17.0000 g | PACK | Freq: Every day | ORAL | Status: DC
Start: 2022-12-28 — End: 2023-01-05
  Administered 2022-12-28 – 2023-01-05 (×5): 17 g via ORAL
  Filled 2022-12-28 (×9): qty 1

## 2022-12-28 NOTE — TOC Progression Note (Signed)
Transition of Care [facility]) - Progression Note    Patient Details  Name: Danielle [name] MRN: [id number] Date of Birth: 1945-04-05  [facility] CM/SW Contact  Lawerance Sabal, RN Phone Number: 12/28/2022, 11:05 AM  Clinical Narrative:     [name] w patient at bedside. She states that her primary caregiver, spouse Ross Marcus, is home from the hospital. He has parkinsons disease. They also have an aid but she id not elaborate on how much support is provided.  She states that she is weaker than what she feels comfortable going home to with level of support at home.  She states that she would like SNF for a short term before going home, she has discussed this with [facility]. She identifies that she has gone to SNF before, off [employer], but could not remember the name of the facility. I looked in [facility] and did not see it.  Handed off to CSW for SNF placement, MD aware.   Expected Discharge Plan: Skilled Nursing Facility Barriers to Discharge: Continued Medical Work up  Expected Discharge Plan and Services In-house Referral: Clinical Social Work Discharge Planning Services: CM Consult Post Acute Care Choice: Skilled Nursing Facility Living arrangements for the past 2 months: Single Family Home                           HH Arranged: PT, OT HH Agency: [facility] Date [facility] Agency Contacted: 12/27/22 Time HH Agency Contacted: 1423 Representative spoke with at [facility] Agency: Kandee Keen   Social Determinants of Health (SDOH) Interventions SDOH Screenings   Food Insecurity: No Food Insecurity (12/25/2022)  Housing: Low Risk  (12/25/2022)  Transportation Needs: No Transportation Needs (12/25/2022)  Utilities: Not At Risk (12/25/2022)  Alcohol Screen: Low Risk  (02/01/2022)  Depression (PHQ2-9): Low Risk  (06/04/2022)  Recent Concern: Depression (PHQ2-9) - Medium Risk (04/09/2022)  Financial Resource Strain: Low Risk  (02/01/2022)  Recent Concern: Financial Resource  Strain - Medium Risk (01/25/2022)  Physical Activity: Inactive (02/01/2022)  Social Connections: Socially Integrated (02/01/2022)  Stress: No Stress Concern Present (02/01/2022)  Tobacco Use: Medium Risk (12/27/2022)    Readmission Risk Interventions     No data to display

## 2022-12-28 NOTE — Progress Notes (Signed)
BLE venous attempted, patient using restroom.  Will re-attempt as patient availability and schedule permit.   12/28/2022 4:35 PM Magan Winnett RVT, RDMS

## 2022-12-28 NOTE — Telephone Encounter (Signed)
Pharmacy Patient Advocate Encounter  Received notification from [facility] that Prior Authorization for Eliquis 5 mg has been APPROVED from 12/28/2022 to 02/28/2024. Ran test claim, Copay is $10.00. This test claim was processed through [facility]- copay amounts may vary at other pharmacies due to pharmacy/plan contracts, or as the patient moves through the different stages of their insurance plan.   PA #/Case ID/Reference #: [id number] Key: [id number]

## 2022-12-28 NOTE — Progress Notes (Addendum)
RE: Danielle [name]  Date of Birth: [birth year]  Date: 12/28/2022    To Whom It May Concern:   Please be advised that the above-named patient will require a short-term nursing home stay - anticipated 30 days or less for rehabilitation and strengthening. The plan is for return home.

## 2022-12-28 NOTE — Progress Notes (Addendum)
[address], [place], [place] [address]   Blood culture (routine x 2)     Status: None (Preliminary result)   Collection Time: 12/25/22  6:12 AM   Specimen: BLOOD RIGHT HAND  Result Value Ref Range Status   Specimen Description BLOOD RIGHT HAND   Final   Special Requests   Final    BOTTLES DRAWN AEROBIC ONLY Blood Culture adequate volume   Culture   Final    NO GROWTH 2 DAYS Performed at [facility], [address] [place], [place] [address]    Report Status PENDING  Incomplete  Blood culture (routine x 2)     Status: None (Preliminary result)   Collection Time: 12/25/22  6:25 AM   Specimen: BLOOD  Result Value Ref Range Status   Specimen Description BLOOD SITE NOT SPECIFIED  Final   Special Requests   Final    BOTTLES DRAWN AEROBIC AND ANAEROBIC Blood Culture adequate volume   Culture   Final    NO GROWTH 2 DAYS Performed at [facility], [address] [place], [place] [address]    Report Status PENDING  Incomplete      Radiology Studies: NM Pulmonary Perf and Vent  Result Date: 12/27/2022 CLINICAL DATA:  Shortness of breath.  Hypoxia. EXAM: NUCLEAR MEDICINE PERFUSION LUNG SCAN TECHNIQUE: Perfusion images were obtained in multiple projections after intravenous injection of radiopharmaceutical. Ventilation scans intentionally deferred if perfusion scan and chest x-ray adequate for interpretation during COVID 19 epidemic. RADIOPHARMACEUTICALS:  0.3 mCi Tc-60m MAA IV COMPARISON:  One-view chest x-ray 12/27/2022 FINDINGS: A wedge-shaped perfusion defect is present in the superior segment of the right lower lobe. No other defects are present. IMPRESSION: Pulmonary embolism present. Electronically Signed   By: Marin Roberts M.D.   On: 12/27/2022 14:36   ECHOCARDIOGRAM COMPLETE  Result Date: 12/27/2022    ECHOCARDIOGRAM REPORT   Patient Name:   Danielle [name] Date of Exam: 12/27/2022 Medical Rec #:  [id number]                   Height:       67.0 in Accession #:    [id number]                  Weight:       250.7 lb Date of Birth:  08-13-1945                  BSA:          2.226 m Patient Age:    76 years                    BP:           108/69 mmHg Patient Gender: F                           HR:            93 bpm. Exam Location:  Inpatient Procedure: 2D Echo, Color Doppler, Cardiac Doppler and Intracardiac            Opacification Agent Indications:    I50.21 Acute systolic (congestive) heart failure  History:        Patient has prior history of Echocardiogram examinations, most                 recent 01/07/2022. CHF, COPD, Arrythmias:Atrial Fibrillation;  [address], [place], [place] [address]   Blood culture (routine x 2)     Status: None (Preliminary result)   Collection Time: 12/25/22  6:12 AM   Specimen: BLOOD RIGHT HAND  Result Value Ref Range Status   Specimen Description BLOOD RIGHT HAND   Final   Special Requests   Final    BOTTLES DRAWN AEROBIC ONLY Blood Culture adequate volume   Culture   Final    NO GROWTH 2 DAYS Performed at [facility], [address] [place], [place] [address]    Report Status PENDING  Incomplete  Blood culture (routine x 2)     Status: None (Preliminary result)   Collection Time: 12/25/22  6:25 AM   Specimen: BLOOD  Result Value Ref Range Status   Specimen Description BLOOD SITE NOT SPECIFIED  Final   Special Requests   Final    BOTTLES DRAWN AEROBIC AND ANAEROBIC Blood Culture adequate volume   Culture   Final    NO GROWTH 2 DAYS Performed at [facility], [address] [place], [place] [address]    Report Status PENDING  Incomplete      Radiology Studies: NM Pulmonary Perf and Vent  Result Date: 12/27/2022 CLINICAL DATA:  Shortness of breath.  Hypoxia. EXAM: NUCLEAR MEDICINE PERFUSION LUNG SCAN TECHNIQUE: Perfusion images were obtained in multiple projections after intravenous injection of radiopharmaceutical. Ventilation scans intentionally deferred if perfusion scan and chest x-ray adequate for interpretation during COVID 19 epidemic. RADIOPHARMACEUTICALS:  0.3 mCi Tc-60m MAA IV COMPARISON:  One-view chest x-ray 12/27/2022 FINDINGS: A wedge-shaped perfusion defect is present in the superior segment of the right lower lobe. No other defects are present. IMPRESSION: Pulmonary embolism present. Electronically Signed   By: Marin Roberts M.D.   On: 12/27/2022 14:36   ECHOCARDIOGRAM COMPLETE  Result Date: 12/27/2022    ECHOCARDIOGRAM REPORT   Patient Name:   Danielle [name] Date of Exam: 12/27/2022 Medical Rec #:  [id number]                   Height:       67.0 in Accession #:    [id number]                  Weight:       250.7 lb Date of Birth:  08-13-1945                  BSA:          2.226 m Patient Age:    76 years                    BP:           108/69 mmHg Patient Gender: F                           HR:            93 bpm. Exam Location:  Inpatient Procedure: 2D Echo, Color Doppler, Cardiac Doppler and Intracardiac            Opacification Agent Indications:    I50.21 Acute systolic (congestive) heart failure  History:        Patient has prior history of Echocardiogram examinations, most                 recent 01/07/2022. CHF, COPD, Arrythmias:Atrial Fibrillation;  PROGRESS NOTE    Danielle [name]  QIH:[id number] DOB: Oct 18, 1945 DOA: 12/25/2022 PCP: [place], [name], MD   Brief Narrative: Danielle [name] is a 77 y.o. female with a history of hypertension, hyperlipidemia, CAD, heart failure with reduced EF, paroxysmal atrial fibrillation, COPD, hyperthyroidism, thrombocytopenia, GERD, obesity.  Patient presented secondary to shortness of breath with hypoxia and found to have evidence of sepsis with presumed pneumonia. Also concern for possible acute PE, started on Heparin IV. Blood cultures obtained and antibiotics started. V/Q scan obtained on 10/28 and confirms presence of a pulmonary embolism.   Assessment and Plan:  Severe sepsis Present on admission with presumed pneumonia. Blood cultures obtained. Antibiotics initiated for pneumonia. Blood cultures no growth x2 days. -Continue antibiotics -Follow-up blood cultures  Presumed community acquired pneumonia Patient with hypoxia and fever with x-ray evidence suggesting possible left lung base infection. Patient started empirically on Ceftriaxone and azithromycin. -Cefdinir and Azithromycin PO  COPD exacerbation Present on admission. Patient treated with steroids and breathing treatments. No wheezing noted. -Continue Duonebs, Brovana and budesonide  Acute respiratory failure with hypoxia Likely related to pulmonary embolism. Patient has required up to non-rebreather, now down to room air at rest. Patient continues to require up to 2 L/min with ambulation. -Ambulatory pulse ox prior to discharge  Acute pulmonary embolism Patient with elevated D-dimer measuring >20. V/Q scan confirms present of a pulmonary embolism. Associated demand ischemia that could indicate a level of heart strain. Initial Transthoracic Echocardiogram did not well visualize the RV. -Transition to Eliquis today -Follow-up limited Transthoracic Echocardiogram for RV evaluation -Check LE venous  duplex  Elevated troponin Presumed demand ischemia. No chest pain. Transthoracic Echocardiogram ordered and shows slightly more reduced LVEF of 40-45%.  Likely secondary to pulmonary embolism.  AKI on CKD stage IIIb Baseline creatinine of about 1.6. Creatinine of 3.13 on admission with peak of 3.40. Creatinine improved to 1.54. AKI resolved.  Nausea/vomiting/diarrhea CT abdomen/pelvis without evidence of acute inflammation.  Primary hypertension Noted. Patient is on spironolactone and Coreg as an outpatient.  Paroxysmal atrial fibrillation Noted. Patient is on Coreg and Eliquis as an outpatient.  Chronic heart failure with reduced ejection fracture Last LVEF from 2023 significant for an LVEF of 50% with grade 1 diastolic dysfunction. Patient is on Coreg and spironolactone Transthoracic Echocardiogram ordered this admission and is significant for newly reduced LVEF of 40-45%. Patient will need to follow-up with cardiology as an outpatient. -Continue home Coreg -Continue losartan  Hyperthyroidism TSH and free T4 within normal limits.  Thrombocytopenia Chronic. Stable.  Aortic atherosclerosis Noted on imaging.  History of bioprosthetic aortic valve Noted. From 2016.   Obesity Estimated body mass index is 39.64 kg/m as calculated from the following:   Height as of this encounter: 5\' 7"  (1.702 m).   Weight as of this encounter: 114.8 kg.   DVT prophylaxis: Heparin IV Code Status:   Code Status: Do not attempt resuscitation (DNR) PRE-ARREST INTERVENTIONS DESIRED Family Communication: None at bedside Disposition Plan: Discharge to SNF likely in 1 day pending bed availability at SNF. Likely medically ready in 1 day pending Transthoracic Echocardiogram and LE venous duplex   Consultants:  None  Procedures:  10/28: Transthoracic Echocardiogram 10/28: V/Q scan  Antimicrobials: Ceftriaxone Azithromycin    Subjective: No concerns this morning. Feels  well.  Objective: BP 117/75 (BP Location: Right Arm)   Pulse 71   Temp 98.2 F (36.8 C) (Oral)   Resp 20   Ht 5\' 7"  (1.702 m)   Wt 114.8  [address], [place], [place] [address]   Blood culture (routine x 2)     Status: None (Preliminary result)   Collection Time: 12/25/22  6:12 AM   Specimen: BLOOD RIGHT HAND  Result Value Ref Range Status   Specimen Description BLOOD RIGHT HAND   Final   Special Requests   Final    BOTTLES DRAWN AEROBIC ONLY Blood Culture adequate volume   Culture   Final    NO GROWTH 2 DAYS Performed at [facility], [address] [place], [place] [address]    Report Status PENDING  Incomplete  Blood culture (routine x 2)     Status: None (Preliminary result)   Collection Time: 12/25/22  6:25 AM   Specimen: BLOOD  Result Value Ref Range Status   Specimen Description BLOOD SITE NOT SPECIFIED  Final   Special Requests   Final    BOTTLES DRAWN AEROBIC AND ANAEROBIC Blood Culture adequate volume   Culture   Final    NO GROWTH 2 DAYS Performed at [facility], [address] [place], [place] [address]    Report Status PENDING  Incomplete      Radiology Studies: NM Pulmonary Perf and Vent  Result Date: 12/27/2022 CLINICAL DATA:  Shortness of breath.  Hypoxia. EXAM: NUCLEAR MEDICINE PERFUSION LUNG SCAN TECHNIQUE: Perfusion images were obtained in multiple projections after intravenous injection of radiopharmaceutical. Ventilation scans intentionally deferred if perfusion scan and chest x-ray adequate for interpretation during COVID 19 epidemic. RADIOPHARMACEUTICALS:  0.3 mCi Tc-60m MAA IV COMPARISON:  One-view chest x-ray 12/27/2022 FINDINGS: A wedge-shaped perfusion defect is present in the superior segment of the right lower lobe. No other defects are present. IMPRESSION: Pulmonary embolism present. Electronically Signed   By: Marin Roberts M.D.   On: 12/27/2022 14:36   ECHOCARDIOGRAM COMPLETE  Result Date: 12/27/2022    ECHOCARDIOGRAM REPORT   Patient Name:   Danielle [name] Date of Exam: 12/27/2022 Medical Rec #:  [id number]                   Height:       67.0 in Accession #:    [id number]                  Weight:       250.7 lb Date of Birth:  08-13-1945                  BSA:          2.226 m Patient Age:    76 years                    BP:           108/69 mmHg Patient Gender: F                           HR:            93 bpm. Exam Location:  Inpatient Procedure: 2D Echo, Color Doppler, Cardiac Doppler and Intracardiac            Opacification Agent Indications:    I50.21 Acute systolic (congestive) heart failure  History:        Patient has prior history of Echocardiogram examinations, most                 recent 01/07/2022. CHF, COPD, Arrythmias:Atrial Fibrillation;  [address], [place], [place] [address]   Blood culture (routine x 2)     Status: None (Preliminary result)   Collection Time: 12/25/22  6:12 AM   Specimen: BLOOD RIGHT HAND  Result Value Ref Range Status   Specimen Description BLOOD RIGHT HAND   Final   Special Requests   Final    BOTTLES DRAWN AEROBIC ONLY Blood Culture adequate volume   Culture   Final    NO GROWTH 2 DAYS Performed at [facility], [address] [place], [place] [address]    Report Status PENDING  Incomplete  Blood culture (routine x 2)     Status: None (Preliminary result)   Collection Time: 12/25/22  6:25 AM   Specimen: BLOOD  Result Value Ref Range Status   Specimen Description BLOOD SITE NOT SPECIFIED  Final   Special Requests   Final    BOTTLES DRAWN AEROBIC AND ANAEROBIC Blood Culture adequate volume   Culture   Final    NO GROWTH 2 DAYS Performed at [facility], [address] [place], [place] [address]    Report Status PENDING  Incomplete      Radiology Studies: NM Pulmonary Perf and Vent  Result Date: 12/27/2022 CLINICAL DATA:  Shortness of breath.  Hypoxia. EXAM: NUCLEAR MEDICINE PERFUSION LUNG SCAN TECHNIQUE: Perfusion images were obtained in multiple projections after intravenous injection of radiopharmaceutical. Ventilation scans intentionally deferred if perfusion scan and chest x-ray adequate for interpretation during COVID 19 epidemic. RADIOPHARMACEUTICALS:  0.3 mCi Tc-60m MAA IV COMPARISON:  One-view chest x-ray 12/27/2022 FINDINGS: A wedge-shaped perfusion defect is present in the superior segment of the right lower lobe. No other defects are present. IMPRESSION: Pulmonary embolism present. Electronically Signed   By: Marin Roberts M.D.   On: 12/27/2022 14:36   ECHOCARDIOGRAM COMPLETE  Result Date: 12/27/2022    ECHOCARDIOGRAM REPORT   Patient Name:   Danielle [name] Date of Exam: 12/27/2022 Medical Rec #:  [id number]                   Height:       67.0 in Accession #:    [id number]                  Weight:       250.7 lb Date of Birth:  08-13-1945                  BSA:          2.226 m Patient Age:    76 years                    BP:           108/69 mmHg Patient Gender: F                           HR:            93 bpm. Exam Location:  Inpatient Procedure: 2D Echo, Color Doppler, Cardiac Doppler and Intracardiac            Opacification Agent Indications:    I50.21 Acute systolic (congestive) heart failure  History:        Patient has prior history of Echocardiogram examinations, most                 recent 01/07/2022. CHF, COPD, Arrythmias:Atrial Fibrillation;

## 2022-12-28 NOTE — Progress Notes (Signed)
Echocardiogram 2D Echocardiogram has been performed.  Lucendia Herrlich 12/28/2022, 3:50 PM

## 2022-12-28 NOTE — Discharge Instructions (Signed)
Information on my medicine - ELIQUIS (apixaban)  This medication education was reviewed with me or my healthcare representative as part of my discharge preparation.  The pharmacist that spoke with me during my hospital stay was:  Mosetta Anis, [facility]  Why was Eliquis prescribed for you? Eliquis was prescribed to treat blood clots that may have been found in the veins of your legs (deep vein thrombosis) or in your lungs (pulmonary embolism) and to reduce the risk of them occurring again.  What do You need to know about Eliquis ? The starting dose is 10 mg (two 5 mg tablets) taken TWICE daily for the FIRST SEVEN (7) DAYS, then on (enter date)  01/04/23  the dose is reduced to ONE 5 mg tablet taken TWICE daily.  Eliquis may be taken with or without food.   Try to take the dose about the same time in the morning and in the evening. If you have difficulty swallowing the tablet whole please discuss with your pharmacist how to take the medication safely.  Take Eliquis exactly as prescribed and DO NOT stop taking Eliquis without talking to the doctor who prescribed the medication.  Stopping may increase your risk of developing a new blood clot.  Refill your prescription before you run out.  After discharge, you should have regular check-up appointments with your healthcare provider that is prescribing your Eliquis.    What do you do if you miss a dose? If a dose of ELIQUIS is not taken at the scheduled time, take it as soon as possible on the same day and twice-daily administration should be resumed. The dose should not be doubled to make up for a missed dose.  Important Safety Information A possible side effect of Eliquis is bleeding. You should call your healthcare provider right away if you experience any of the following: Bleeding from an injury or your nose that does not stop. Unusual colored urine (red or dark brown) or unusual colored stools (red or black). Unusual bruising for  unknown reasons. A serious fall or if you hit your head (even if there is no bleeding).  Some medicines may interact with Eliquis and might increase your risk of bleeding or clotting while on Eliquis. To help avoid this, consult your healthcare provider or pharmacist prior to using any new prescription or non-prescription medications, including herbals, vitamins, non-steroidal anti-inflammatory drugs (NSAIDs) and supplements.  This website has more information on Eliquis (apixaban): http://www.eliquis.com/eliquis/home

## 2022-12-28 NOTE — Progress Notes (Signed)
Physical Therapy Treatment Patient Details Name: Danielle [name] MRN: [id number] DOB: 1945-11-12 Today's Date: 12/28/2022   History of Present Illness Pt is 77 year old presented [facility] on  12/25/22 for acute respiratory failure with hypoxia and copd exacerbation. VQ scan confirms PE 10/28. PMH - anxiety, breast cancer, COPD, CHF, CAD, HTN, and CVA with residual RUE weakness, Lt hip fx with IM nail    PT Comments  Pt reporting min dyspnea with transfer-level mobility prior to PT arrival. Pt overall requiring min assist for bed mobility, transfers, and short-distance gait, pt limited by fatigue, dyspnea, and SPO2 drop to 85% on RA requiring 2LO2 to maintain sats. Pt tolerated LE strengthening exercises well post-gait, but just has limited tolerance at this time. Pt lives at home with her husband, who has parkinson's and who cannot assist her at home. Patient will benefit from continued inpatient follow up therapy, <3 hours/day, plan of care updated accordingly. PT to cotninue to follow.       If plan is discharge home, recommend the following: A little help with walking and/or transfers;A little help with bathing/dressing/bathroom;Assistance with cooking/housework;Assist for transportation   Can travel by private vehicle        Equipment Recommendations  None recommended by PT    Recommendations for Other Services       Precautions / Restrictions Precautions Precautions: Fall;Other (comment) Precaution Comments: watch SpO2. HR and SOB Restrictions Weight Bearing Restrictions: No     Mobility  Bed Mobility Overal bed mobility: Needs Assistance Bed Mobility: Supine to Sit     Supine to sit: Min assist, HOB elevated, Used rails     General bed mobility comments: light assist to scoot hips to EOB with bed pads.    Transfers Overall transfer level: Needs assistance Equipment used: Rolling walker (2 wheels) Transfers: Sit to/from Stand Sit to Stand: Min assist            General transfer comment: assist for rise and steady, stand x2 from EOB and recliner.    Ambulation/Gait Ambulation/Gait assistance: Min assist Gait Distance (Feet): 30 Feet Assistive device: Rolling walker (2 wheels) Gait Pattern/deviations: Step-through pattern, Shuffle, Trunk flexed, Decreased stride length Gait velocity: decr     General Gait Details: assist for steadying, guiding RW. Cues for proximity to RW, pursed lip breathing techinque. DOE 3/4, SPO2 85% on RA requiring 2LO2 to maintain SPO2 >88%   Stairs             Wheelchair Mobility     Tilt Bed    Modified Rankin (Stroke Patients Only)       Balance Overall balance assessment: Needs assistance Sitting-balance support: No upper extremity supported, Feet supported Sitting balance-Leahy Scale: Fair     Standing balance support: Bilateral upper extremity supported, During functional activity, Reliant on assistive device for balance Standing balance-Leahy Scale: Poor                              Cognition Arousal: Alert Behavior During Therapy: WFL for tasks assessed/performed Overall Cognitive Status: No family/caregiver present to determine baseline cognitive functioning                                 General Comments: impaired safety awareness, possible memory deficits        Exercises General Exercises - Lower Extremity Long Arc Quad: AROM, Both, 15 reps,  Seated Hip Flexion/Marching: AROM, Both, 10 reps, Seated    General Comments        Pertinent Vitals/Pain Pain Assessment Pain Assessment: No/denies pain    Home Living                          Prior Function            PT Goals (current goals can now be found in the care plan section) Acute Rehab PT Goals Patient Stated Goal: return home PT Goal Formulation: With patient Time For Goal Achievement: 01/09/23 Potential to Achieve Goals: Good Progress towards PT goals:  Progressing toward goals    Frequency    Min 1X/week      PT Plan      Co-evaluation              AM-PAC PT "6 Clicks" Mobility   Outcome Measure  Help needed turning from your back to your side while in a flat bed without using bedrails?: A Little Help needed moving from lying on your back to sitting on the side of a flat bed without using bedrails?: A Little Help needed moving to and from a bed to a chair (including a wheelchair)?: A Little Help needed standing up from a chair using your arms (e.g., wheelchair or bedside chair)?: A Little Help needed to walk in hospital room?: A Little Help needed climbing 3-5 steps with a railing? : A Lot 6 Click Score: 17    End of Session Equipment Utilized During Treatment: Oxygen;Gait belt Activity Tolerance: Patient limited by fatigue Patient left: with call bell/phone within reach;in chair;with chair alarm set Nurse Communication: Mobility status PT Visit Diagnosis: Other abnormalities of gait and mobility (R26.89);Muscle weakness (generalized) (M62.81);History of falling (Z91.81)     Time: 6433-2951 PT Time Calculation (min) (ACUTE ONLY): 33 min  Charges:    $Gait Training: 8-22 mins $Therapeutic Activity: 8-22 mins PT General Charges $$ ACUTE PT VISIT: 1 Visit                     Marye Round, PT DPT Acute Rehabilitation Services Secure Chat Preferred  Office [phone]    Koleman Marling E Stroup 12/28/2022, 10:10 AM

## 2022-12-28 NOTE — Plan of Care (Signed)
  Problem: Education: Goal: Knowledge of General Education information will improve Description: Including pain rating scale, medication(s)/side effects and non-pharmacologic comfort measures Outcome: Progressing   Problem: Clinical Measurements: Goal: Ability to maintain clinical measurements within normal limits will improve Outcome: Progressing   Problem: Coping: Goal: Level of anxiety will decrease Outcome: Progressing   Problem: Pain Management: Goal: General experience of comfort will improve Outcome: Progressing   Problem: Safety: Goal: Ability to remain free from injury will improve Outcome: Progressing   Problem: Skin Integrity: Goal: Risk for impaired skin integrity will decrease Outcome: Progressing

## 2022-12-28 NOTE — Progress Notes (Signed)
Heart Failure Navigator Progress Note  Assessed for Heart & Vascular TOC clinic readiness.  Patient does not meet criteria due to established Advanced Heart Failure Team patient of Dr. Shirlee Latch.   Navigator will sign off at this time.  Roxy Horseman, RN, BSN [facility] Heart Failure Navigator Secure Chat Only

## 2022-12-28 NOTE — [place] FL2 (Signed)
[place] MEDICAID FL2 LEVEL OF CARE FORM     IDENTIFICATION  Patient Name: Rosalynd Swindle Birthdate: [birth year] Sex: female Admission Date (Current Location): 12/25/2022  [facility] [place] Number:  Pro[employer], television[employer]film/video and Address:  [facility], [address] [place], [place] [address]      Provider Number: [id number]  Attending Physician Name and Address:  Narda Bonds, MD  Relative Name and Phone Number:  Ross Marcus (Spouse) [phone]    Current Level of Care: Hospital Recommended Level of Care: Skilled Nursing Facility Prior Approval Number:    Date Approved/Denied:   PASRR Number: PASRR under review  Discharge Plan: SNF    Current Diagnoses: Patient Active Problem List   Diagnosis Date Noted   Acute pulmonary embolism (HCC) 12/27/2022   Acute respiratory failure with hypoxia (HCC) 12/25/2022   SIRS (systemic inflammatory response syndrome) (HCC) 12/25/2022   Elevated troponin 12/25/2022   Acute kidney injury superimposed on chronic kidney disease (HCC) 12/25/2022   Nausea, vomiting, and diarrhea 12/25/2022   Obesity (BMI 30-39.9) 12/25/2022   Gastroesophageal reflux disease 04/09/2022   Abdominal pain, periumbilical 04/09/2022   Chronic pain of both shoulders 12/17/2021   Depression, major, single episode, moderate (HCC) 12/08/2020   Gait abnormality 07/31/2020   Paresthesia 07/31/2020   Low back pain with bilateral sciatica 07/31/2020   Pneumonia due to COVID-19 virus    Bacteremia due to Gram-positive bacteria 08/29/2018   Abscess    Cellulitis of left breast 08/28/2018   Cellulitis of left abdominal wall 08/28/2018   Routine general medical examination at a health care facility 04/28/2018   Stage 3b chronic kidney disease (HCC) 09/27/2017   Recurrent genital herpes 04/13/2017   Chronic pain disorder 01/25/2017   Malignant neoplasm of upper-outer quadrant of left breast in female, estrogen receptor positive  (HCC) 12/16/2016   Chronic knee pain 12/07/2016   Chronic bilateral low back pain without sciatica 12/07/2016   Peripheral musculoskeletal gait disorder 12/07/2016   Encounter for therapeutic drug monitoring 06/16/2016   Generalized osteoarthritis of multiple sites 05/04/2016   Aortic valve disease 04/10/2016   S/P aortic valve replacement with bioprosthetic valve 04/10/2016   NICM (nonischemic cardiomyopathy) (HCC) 04/10/2016   Upper airway cough syndrome 03/14/2016   Class 2 severe obesity with serious comorbidity in adult [facility]) 03/14/2016   COPD (chronic obstructive pulmonary disease) (HCC) 03/11/2016   Thrombocytopenia (HCC) 12/12/2015   Anemia 12/12/2015   Vitamin D deficiency 12/12/2015   Acute urinary retention 12/11/2015   Osteoporosis 12/11/2015   Hip fracture (HCC) 12/10/2015   Aortic atherosclerosis (HCC) 12/10/2015   Lung blebs (HCC) 12/03/2015   Pelvic fracture (HCC) 08/19/2015   Fall at home 08/19/2015   Essential hypertension 08/19/2015   Hyperlipidemia LDL goal <70 08/19/2015   Asthma 08/19/2015   Paroxysmal atrial fibrillation (HCC) 08/19/2015   Hyperthyroidism 08/19/2015   Chronic HFrEF (heart failure with reduced ejection fraction) (HCC)    Coronary artery disease    Aortic stenosis     Orientation RESPIRATION BLADDER Height & Weight     Self, Time, Situation, Place  O2 (Nasal Cannula 2 liters) Incontinent Weight: 253 lb 1.4 oz (114.8 kg) Height:  5\' 7"  (170.2 cm)  BEHAVIORAL SYMPTOMS/MOOD NEUROLOGICAL BOWEL NUTRITION STATUS      Incontinent Diet (Please see discharge summary)  AMBULATORY STATUS COMMUNICATION OF NEEDS Skin   Limited Assist Verbally Other (Comment) (Dry,Ecchymosis,hand,nose,buttocks,R,wound/Incision LDAs)  Personal Care Assistance Level of Assistance  Bathing, Feeding, Dressing Bathing Assistance: Limited assistance Feeding assistance: Independent Dressing Assistance: Limited assistance     Functional  Limitations Info  Sight, Hearing, Speech Sight Info: Impaired Financial trader) Hearing Info: Adequate Speech Info: Adequate    SPECIAL CARE FACTORS FREQUENCY  PT (By licensed PT), OT (By licensed OT)     PT Frequency: 5x min weekly OT Frequency: 5x min weekly            Contractures Contractures Info: Not present    Additional Factors Info  Code Status, Allergies Code Status Info: DNR Allergies Info: Tetanus Toxoid Adsorbed,Zestril (lisinopril),Peanut (diagnostic)           Current Medications (12/28/2022):  This is the current hospital active medication list Current Facility-Administered Medications  Medication Dose Route Frequency Provider Last Rate Last Admin   acetaminophen (TYLENOL) tablet 650 mg  650 mg Oral Q6H PRN Madelyn Flavors A, MD   650 mg at 12/25/22 1709   Or   acetaminophen (TYLENOL) suppository 650 mg  650 mg Rectal Q6H PRN Madelyn Flavors A, MD       albuterol (PROVENTIL) (2.5 MG/3ML) 0.083% nebulizer solution 2.5 mg  2.5 mg Nebulization Q2H PRN Madelyn Flavors A, MD       apixaban (ELIQUIS) tablet 10 mg  10 mg Oral BID Narda Bonds, MD   10 mg at 12/28/22 1025   Followed by   Melene Muller ON 01/04/2023] apixaban (ELIQUIS) tablet 5 mg  5 mg Oral BID Narda Bonds, MD       arformoterol (BROVANA) nebulizer solution 15 mcg  15 mcg Nebulization BID Madelyn Flavors A, MD   15 mcg at 12/28/22 0720   azithromycin (ZITHROMAX) tablet 500 mg  500 mg Oral Daily Narda Bonds, MD   500 mg at 12/28/22 0854   budesonide (PULMICORT) nebulizer solution 0.5 mg  0.5 mg Nebulization BID Madelyn Flavors A, MD   0.5 mg at 12/28/22 0720   carvedilol (COREG) tablet 25 mg  25 mg Oral BID WC Narda Bonds, MD   25 mg at 12/28/22 0854   cefdinir (OMNICEF) capsule 300 mg  300 mg Oral Q12H Narda Bonds, MD   300 mg at 12/28/22 0854   lidocaine (LIDODERM) 5 % 2 patch  2 patch Transdermal Q24H Madelyn Flavors A, MD   2 patch at 12/26/22 1728   LORazepam (ATIVAN) injection 0.5 mg  0.5 mg  Intravenous Q4H PRN Madelyn Flavors A, MD   0.5 mg at 12/25/22 1012   losartan (COZAAR) tablet 25 mg  25 mg Oral Daily Narda Bonds, MD   25 mg at 12/28/22 0854   melatonin tablet 3 mg  3 mg Oral QHS PRN Hillary Bow, DO   3 mg at 12/25/22 2100   ondansetron (ZOFRAN) tablet 4 mg  4 mg Oral Q6H PRN Clydie Braun, MD       Or   ondansetron (ZOFRAN) injection 4 mg  4 mg Intravenous Q6H PRN Madelyn Flavors A, MD   4 mg at 12/25/22 1011   polyethylene glycol (MIRALAX / GLYCOLAX) packet 17 g  17 g Oral Daily Narda Bonds, MD       sodium chloride flush (NS) 0.9 % injection 3 mL  3 mL Intravenous Q12H Madelyn Flavors A, MD   3 mL at 12/27/22 2054     Discharge Medications: Please see discharge summary for a list of discharge medications.  Relevant Imaging Results:  Relevant Lab Results:   Additional Information [id number]  Delilah Shan, LCSWA

## 2022-12-28 NOTE — Progress Notes (Signed)
SATURATION QUALIFICATIONS: (This note is used to comply with regulatory documentation for home oxygen)  Patient Saturations on Room Air at Rest = 94%  Patient Saturations on Room Air while Ambulating = 85%  Patient Saturations on 2 Liters of oxygen while Ambulating = 91%  Please briefly explain why patient needs home oxygen: to maintain SPO2 >88%  Zarina Pe S, PT DPT Acute Arts development officer Preferred  Office [phone]

## 2022-12-28 NOTE — TOC Progression Note (Addendum)
Transition of Care [facility]) - Progression Note    Patient Details  Name: Danielle [name] MRN: [id number] Date of Birth: [birth year]  [facility]) CM/SW Contact  Delilah Shan, LCSWA Phone Number: 12/28/2022, 1:43 PM  Clinical Narrative:     CSW spoke with patient who is agreeable to PT recs for SNF placement at time of discharge. Patient agreeable for CSW to fax out initial referral near the [facility] area for possible SNF placement. Insurance authorization process discussed. All questions answered. No further questions reported at this time. Passr currently pending. CSW submitted requested clinicals to [place] must for review. CSW called patients insurance to try and start insurance authorization for patient. [employer] insurance informed CSW that they no longer manage patient. CSW informed patient. Patient reports she has [employer] Gold. CSW requested to see copy of patients insurance card to verify patients insurance. Patient informed CSW she is going to call her spouse to see if he can bring insurance card in. CSW will continue to follow.  Update-3:31pm- CSW spoke with patient and patients spouse at bedside. Patients spouse provided patients insurance card. The card patients spouse provided was the same insurance information we have on file. Patient informed CSW they plan on getting [facility], and that plan will start in January. Patient gave CSW permission to reach out to J. Delford Field in financial counseling to verify if patient has current benefits. CSW emailed Mallory Shirk and requested to see if patient has any current insurance benefits. CSW will continue to follow.   Expected Discharge Plan: Skilled Nursing Facility Barriers to Discharge: Continued Medical Work up  Expected Discharge Plan and Services In-house Referral: Clinical Social Work Discharge Planning Services: CM Consult Post Acute Care Choice: Skilled Nursing Facility Living arrangements for the past 2 months: Single  Family Home                           HH Arranged: PT, OT HH Agency: [facility] Date [facility] Agency Contacted: 12/27/22 Time HH Agency Contacted: 1423 Representative spoke with at [facility] Agency: Kandee Keen   Social Determinants of Health (SDOH) Interventions SDOH Screenings   Food Insecurity: No Food Insecurity (12/25/2022)  Housing: Low Risk  (12/25/2022)  Transportation Needs: No Transportation Needs (12/25/2022)  Utilities: Not At Risk (12/25/2022)  Alcohol Screen: Low Risk  (02/01/2022)  Depression (PHQ2-9): Low Risk  (06/04/2022)  Recent Concern: Depression (PHQ2-9) - Medium Risk (04/09/2022)  Financial Resource Strain: Low Risk  (02/01/2022)  Recent Concern: Financial Resource Strain - Medium Risk (01/25/2022)  Physical Activity: Inactive (02/01/2022)  Social Connections: Socially Integrated (02/01/2022)  Stress: No Stress Concern Present (02/01/2022)  Tobacco Use: Medium Risk (12/27/2022)    Readmission Risk Interventions     No data to display

## 2022-12-29 ENCOUNTER — Other Ambulatory Visit: Payer: Self-pay

## 2022-12-29 ENCOUNTER — Inpatient Hospital Stay (HOSPITAL_COMMUNITY): Payer: Medicare HMO

## 2022-12-29 DIAGNOSIS — J9601 Acute respiratory failure with hypoxia: Secondary | ICD-10-CM | POA: Diagnosis not present

## 2022-12-29 DIAGNOSIS — Z86711 Personal history of pulmonary embolism: Secondary | ICD-10-CM

## 2022-12-29 NOTE — TOC Progression Note (Addendum)
Transition of Care [facility]) - Progression Note    Patient Details  Name: Danielle [name] MRN: [id number] Date of Birth: 1945/05/11  [facility]) CM/SW Contact  Delilah Shan, LCSWA Phone Number: 12/29/2022, 10:11 AM  Clinical Narrative:     CSW received message from Julian with financial counseling who verified patients insurance and informed CSW that patient is covered by [place] effective in April 2024. CSW called patients insurance back and spoke with Dottie who informed CSW that patient is not managed by [place]. Patient has regular [employer], facility will start insurance authorization. CSW informed patient. Patient confirmed she does want to go to SNF. CSW provided patient with medicare compare ratings list with accepted SNF bed offers. Patient would like to discuss SNF bed offers with her spouse. CSW will follow up with patient with SNF choice once patient able to speak with her spouse to discuss SNF choice. CSW will continue to follow  Update-1:27pm- CSW spoke with patient and patients spouse. Patient accepted SNF bed offer with [facility]. CSW spoke with Bjorn Loser at [facility] who confirmed she will start insurance authorization for patient. CSW will continue to follow and assist with patients dc planning needs. Patients Passr has been approved [id number].  Expected Discharge Plan: Skilled Nursing Facility Barriers to Discharge: Continued Medical Work up  Expected Discharge Plan and Services In-house Referral: Clinical Social Work Discharge Planning Services: CM Consult Post Acute Care Choice: Skilled Nursing Facility Living arrangements for the past 2 months: Single Family Home                           HH Arranged: PT, OT HH Agency: [facility] Date [facility] Agency Contacted: 12/27/22 Time HH Agency Contacted: 1423 Representative spoke with at [facility] Agency: Kandee Keen   Social Determinants of Health (SDOH) Interventions SDOH Screenings   Food  Insecurity: No Food Insecurity (12/25/2022)  Housing: Low Risk  (12/25/2022)  Transportation Needs: No Transportation Needs (12/25/2022)  Utilities: Not At Risk (12/25/2022)  Alcohol Screen: Low Risk  (02/01/2022)  Depression (PHQ2-9): Low Risk  (06/04/2022)  Recent Concern: Depression (PHQ2-9) - Medium Risk (04/09/2022)  Financial Resource Strain: Low Risk  (02/01/2022)  Recent Concern: Financial Resource Strain - Medium Risk (01/25/2022)  Physical Activity: Inactive (02/01/2022)  Social Connections: Socially Integrated (02/01/2022)  Stress: No Stress Concern Present (02/01/2022)  Tobacco Use: Medium Risk (12/27/2022)    Readmission Risk Interventions     No data to display

## 2022-12-29 NOTE — Progress Notes (Signed)
PROGRESS NOTE    Jacklin Wareing  ZOX:[id number] DOB: 09-Dec-1945 DOA: 12/25/2022 PCP: [place], [name], MD  Chief Complaint  Patient presents with   Shortness of Breath    Patient to ED via ems with complaint of sob lower 70% range on room air having difficulty speaking. Patient was found to be pale and diaphoretic. EMS reports patient was hypotensive with Makaiah Terwilliger BP 70/40. EMS reports giving patient 150 mL NS through Deddrick Saindon 22 gauge IV in left wrist.EMS reports placing Daquavion Catala NRB on patient which improved O2 saturation into 90% range.    Brief Narrative:   Ddnna Kohr is Gracey Tolle 77 y.o. female with Selvin Yun history of hypertension, hyperlipidemia, CAD, heart failure with reduced EF, paroxysmal atrial fibrillation, COPD, hyperthyroidism, thrombocytopenia, GERD, obesity. Patient presented secondary to shortness of breath with hypoxia and found to have evidence of sepsis with presumed pneumonia. Also concern for possible acute PE, started on Heparin IV. Blood cultures obtained and antibiotics started. V/Q scan obtained on 10/28 and confirms presence of Naftuli Dalsanto pulmonary embolism.   Assessment & Plan:   Principal Problem:   Acute respiratory failure with hypoxia (HCC) Active Problems:   SIRS (systemic inflammatory response syndrome) (HCC)   Elevated troponin   Acute kidney injury superimposed on chronic kidney disease (HCC)   Nausea, vomiting, and diarrhea   Essential hypertension   Paroxysmal atrial fibrillation (HCC)   Chronic HFrEF (heart failure with reduced ejection fraction) (HCC)   Hyperthyroidism   Obesity (BMI 30-39.9)   S/P aortic valve replacement with bioprosthetic valve   Acute pulmonary embolism (HCC)  Severe sepsis Present on admission with presumed pneumonia. Blood cultures obtained. Antibiotics initiated for pneumonia. Blood cultures no growth x4 days. -Continue antibiotics -Follow-up blood cultures   Presumed community acquired pneumonia Patient with hypoxia and fever  with x-ray evidence suggesting possible left lung base infection. Patient started empirically on Ceftriaxone and azithromycin. -Cefdinir and Azithromycin PO to complete course   COPD exacerbation Present on admission. Patient treated with steroids and breathing treatments. No wheezing noted. -Continue Duonebs, Brovana and budesonide   Acute respiratory failure with hypoxia Likely related to pulmonary embolism. Patient has required up to non-rebreather, now down to room air at rest. Patient continues to require up to 2 L/min with ambulation. - wean o2 as tolerated -Ambulatory pulse ox prior to discharge   Acute pulmonary embolism Bilateral LE DVT's Patient with elevated D-dimer measuring >20. V/Q scan confirms present of Kapena Hamme pulmonary embolism. Associated demand ischemia that could indicate Laureen Frederic level of heart strain. Initial Transthoracic Echocardiogram did not well visualize the RV. -Transition to Eliquis today -Follow-up limited Transthoracic Echocardiogram for RV evaluation -> appears moderately reduced in some views (will need repeat US within 3 months) - hemodynamically stable  -Check LE venous duplex (with bilateral age indeterminate DVT's involving bilateral popliteal, posterior tibial, and peroneal veins)   Elevated troponin Presumed demand ischemia. No chest pain. Transthoracic Echocardiogram ordered and shows slightly more reduced LVEF of 40-45%.  Likely secondary to pulmonary embolism. Needs cardiology follow up    AKI on CKD stage IIIb Baseline creatinine of about 1.6. Creatinine of 3.13 on admission with peak of 3.40. Creatinine improved to 1.54. AKI resolved.   Nausea/vomiting/diarrhea CT abdomen/pelvis without evidence of acute inflammation.   Primary hypertension Noted. Patient is on spironolactone and Coreg as an outpatient.   Paroxysmal atrial fibrillation Noted. Patient is on Coreg and Eliquis as an outpatient.   Chronic heart failure with reduced ejection  fracture Last LVEF from 2023 significant  Age Indeterminate    +---------+---------------+---------+-----------+----------+-------------------+ Unable to tolerate compression maneuvers due to pain in distal FV.    Summary: BILATERAL: -No evidence of popliteal cyst, bilaterally. RIGHT: - Findings consistent with age indeterminate deep vein thrombosis involving the right popliteal vein, right posterior tibial veins, and right peroneal veins.   LEFT: - Findings consistent with age indeterminate deep vein thrombosis involving the left popliteal vein, left posterior tibial veins, and left peroneal veins.   *See table(s) above for measurements and observations. Electronically signed by Sherald Hess MD on 12/29/2022 at 1:15:36 PM.    Final    ECHOCARDIOGRAM LIMITED  Result Date: 12/28/2022    ECHOCARDIOGRAM LIMITED REPORT   Patient Name:   ALEZANDRA ORTMANN Date of Exam: 12/28/2022 Medical Rec #:  [id number]                   Height:       67.0 in Accession #:    [id number]                  Weight:       253.1 lb Date of Birth:  1945/12/29                  BSA:          2.235 m Patient Age:    77 years                    BP:           117/75 mmHg Patient Gender: F                           HR:           112 bpm. Exam Location:  Inpatient Procedure: Limited Echo, Limited Color Doppler and Intracardiac Opacification            Agent Indications:    Pulmonary Embolus  History:        Patient has prior history of Echocardiogram examinations, most                 recent 12/28/2022. CHF and Cardiomyopathy, CAD, COPD,                 Arrythmias:Atrial Fibrillation; Risk Factors:Hypertension and                 Dyslipidemia. CKD, stage 3.                 Aortic Valve: 23 mm St. Jude Trifecta valve is present in the                 aortic position.  Sonographer:    Lucendia Herrlich RCS Referring Phys: [phone] RALPH Dusti Tetro NETTEY IMPRESSIONS  1. Very poor acoustical windows with very poor endocardial border definition even with definity contrast. Left ventricular ejection fraction  appears at least mildly reduced.  2. Right ventricular systolic function appears moderately reduced in some views. The right ventricular size is mildly dilated.  3. The mitral valve is normal in structure.  4. The aortic valve was not well visualized. Aortic valve regurgitation is not visualized. There is Jaire Pinkham 23 mm St. Jude Trifecta valve present in the aortic position. FINDINGS  Left Ventricle: Left ventricular ejection fraction, by estimation, is 40 to 45%. The left ventricle has mildly decreased function. The left ventricle demonstrates global hypokinesis. Definity contrast agent was given IV to delineate the left ventricular  4:47 AM   Specimen: Anterior Nasal Swab  Result Value Ref Range Status   SARS Coronavirus 2 by RT PCR NEGATIVE NEGATIVE Final   Influenza Meklit Cotta by PCR NEGATIVE NEGATIVE Final   Influenza B by PCR NEGATIVE NEGATIVE Final    Comment: (NOTE) The Xpert Xpress SARS-CoV-2/FLU/RSV plus assay is intended as an aid in the diagnosis of influenza from Nasopharyngeal swab specimens and should not be used as Birttany Dechellis sole basis for treatment. Nasal washings and aspirates are unacceptable for Xpert Xpress SARS-CoV-2/FLU/RSV testing.  Fact Sheet for Patients: BloggerCourse.com  Fact Sheet for Healthcare Providers: SeriousBroker.it  This test is not yet approved or cleared by the [place] FDA and has been authorized for detection and/or diagnosis of SARS-CoV-2 by FDA under an Emergency Use Authorization (EUA). This EUA will remain in effect (meaning this test can be used) for the duration of the COVID-19 declaration under Section 564(b)(1) of the Act, 21 U.S.C. section 360bbb-3(b)(1), unless the authorization is terminated or revoked.     Resp Syncytial Virus by PCR NEGATIVE NEGATIVE Final    Comment: (NOTE) Fact Sheet for Patients: BloggerCourse.com  Fact Sheet for Healthcare Providers: SeriousBroker.it  This test is not yet approved or cleared by the [place] FDA and has been authorized for detection and/or diagnosis of SARS-CoV-2 by FDA under an Emergency Use Authorization (EUA).  This EUA will remain in effect (meaning this test can be used) for the duration of the COVID-19 declaration under Section 564(b)(1) of the Act, 21 U.S.C. section 360bbb-3(b)(1), unless the authorization is terminated or revoked.  Performed at [facility], [address] [place], [place] [address]   Blood culture (routine x 2)     Status: None (Preliminary result)   Collection Time: 12/25/22  6:12 AM   Specimen: BLOOD RIGHT HAND  Result Value Ref Range Status   Specimen Description BLOOD RIGHT HAND  Final   Special Requests   Final    BOTTLES DRAWN AEROBIC ONLY Blood Culture adequate volume   Culture   Final    NO GROWTH 4 DAYS Performed at [facility], [address], [place], [place] [address]    Report Status PENDING  Incomplete  Blood culture (routine x 2)     Status: None (Preliminary result)   Collection Time: 12/25/22  6:25 AM   Specimen: BLOOD  Result Value Ref Range Status   Specimen Description BLOOD SITE NOT SPECIFIED  Final   Special Requests   Final    BOTTLES DRAWN AEROBIC AND ANAEROBIC Blood Culture adequate volume   Culture   Final    NO GROWTH 4 DAYS Performed at [facility], [address] [place], [place] [address]    Report Status PENDING  Incomplete         Radiology Studies: VAS [place] LOWER EXTREMITY VENOUS (DVT)  Result Date: 12/29/2022  Lower Venous DVT Study Patient Name:  JAHLAYA POLA  Date of Exam:   12/29/2022 Medical Rec #: [id number]                    Accession #:    [id number] Date of Birth: 02-15-1946                   Patient Gender: F Patient Age:   40 years Exam Location:  [facility] Procedure:      VAS [place] LOWER EXTREMITY VENOUS (DVT) Referring Phys: RALPH NETTEY --------------------------------------------------------------------------------  Indications: Pain, Swelling, and pulmonary  PROGRESS NOTE    Jacklin Wareing  ZOX:[id number] DOB: 09-Dec-1945 DOA: 12/25/2022 PCP: [place], [name], MD  Chief Complaint  Patient presents with   Shortness of Breath    Patient to ED via ems with complaint of sob lower 70% range on room air having difficulty speaking. Patient was found to be pale and diaphoretic. EMS reports patient was hypotensive with Makaiah Terwilliger BP 70/40. EMS reports giving patient 150 mL NS through Deddrick Saindon 22 gauge IV in left wrist.EMS reports placing Daquavion Catala NRB on patient which improved O2 saturation into 90% range.    Brief Narrative:   Ddnna Kohr is Gracey Tolle 77 y.o. female with Selvin Yun history of hypertension, hyperlipidemia, CAD, heart failure with reduced EF, paroxysmal atrial fibrillation, COPD, hyperthyroidism, thrombocytopenia, GERD, obesity. Patient presented secondary to shortness of breath with hypoxia and found to have evidence of sepsis with presumed pneumonia. Also concern for possible acute PE, started on Heparin IV. Blood cultures obtained and antibiotics started. V/Q scan obtained on 10/28 and confirms presence of Naftuli Dalsanto pulmonary embolism.   Assessment & Plan:   Principal Problem:   Acute respiratory failure with hypoxia (HCC) Active Problems:   SIRS (systemic inflammatory response syndrome) (HCC)   Elevated troponin   Acute kidney injury superimposed on chronic kidney disease (HCC)   Nausea, vomiting, and diarrhea   Essential hypertension   Paroxysmal atrial fibrillation (HCC)   Chronic HFrEF (heart failure with reduced ejection fraction) (HCC)   Hyperthyroidism   Obesity (BMI 30-39.9)   S/P aortic valve replacement with bioprosthetic valve   Acute pulmonary embolism (HCC)  Severe sepsis Present on admission with presumed pneumonia. Blood cultures obtained. Antibiotics initiated for pneumonia. Blood cultures no growth x4 days. -Continue antibiotics -Follow-up blood cultures   Presumed community acquired pneumonia Patient with hypoxia and fever  with x-ray evidence suggesting possible left lung base infection. Patient started empirically on Ceftriaxone and azithromycin. -Cefdinir and Azithromycin PO to complete course   COPD exacerbation Present on admission. Patient treated with steroids and breathing treatments. No wheezing noted. -Continue Duonebs, Brovana and budesonide   Acute respiratory failure with hypoxia Likely related to pulmonary embolism. Patient has required up to non-rebreather, now down to room air at rest. Patient continues to require up to 2 L/min with ambulation. - wean o2 as tolerated -Ambulatory pulse ox prior to discharge   Acute pulmonary embolism Bilateral LE DVT's Patient with elevated D-dimer measuring >20. V/Q scan confirms present of Kapena Hamme pulmonary embolism. Associated demand ischemia that could indicate Laureen Frederic level of heart strain. Initial Transthoracic Echocardiogram did not well visualize the RV. -Transition to Eliquis today -Follow-up limited Transthoracic Echocardiogram for RV evaluation -> appears moderately reduced in some views (will need repeat US within 3 months) - hemodynamically stable  -Check LE venous duplex (with bilateral age indeterminate DVT's involving bilateral popliteal, posterior tibial, and peroneal veins)   Elevated troponin Presumed demand ischemia. No chest pain. Transthoracic Echocardiogram ordered and shows slightly more reduced LVEF of 40-45%.  Likely secondary to pulmonary embolism. Needs cardiology follow up    AKI on CKD stage IIIb Baseline creatinine of about 1.6. Creatinine of 3.13 on admission with peak of 3.40. Creatinine improved to 1.54. AKI resolved.   Nausea/vomiting/diarrhea CT abdomen/pelvis without evidence of acute inflammation.   Primary hypertension Noted. Patient is on spironolactone and Coreg as an outpatient.   Paroxysmal atrial fibrillation Noted. Patient is on Coreg and Eliquis as an outpatient.   Chronic heart failure with reduced ejection  fracture Last LVEF from 2023 significant  4:47 AM   Specimen: Anterior Nasal Swab  Result Value Ref Range Status   SARS Coronavirus 2 by RT PCR NEGATIVE NEGATIVE Final   Influenza Meklit Cotta by PCR NEGATIVE NEGATIVE Final   Influenza B by PCR NEGATIVE NEGATIVE Final    Comment: (NOTE) The Xpert Xpress SARS-CoV-2/FLU/RSV plus assay is intended as an aid in the diagnosis of influenza from Nasopharyngeal swab specimens and should not be used as Birttany Dechellis sole basis for treatment. Nasal washings and aspirates are unacceptable for Xpert Xpress SARS-CoV-2/FLU/RSV testing.  Fact Sheet for Patients: BloggerCourse.com  Fact Sheet for Healthcare Providers: SeriousBroker.it  This test is not yet approved or cleared by the [place] FDA and has been authorized for detection and/or diagnosis of SARS-CoV-2 by FDA under an Emergency Use Authorization (EUA). This EUA will remain in effect (meaning this test can be used) for the duration of the COVID-19 declaration under Section 564(b)(1) of the Act, 21 U.S.C. section 360bbb-3(b)(1), unless the authorization is terminated or revoked.     Resp Syncytial Virus by PCR NEGATIVE NEGATIVE Final    Comment: (NOTE) Fact Sheet for Patients: BloggerCourse.com  Fact Sheet for Healthcare Providers: SeriousBroker.it  This test is not yet approved or cleared by the [place] FDA and has been authorized for detection and/or diagnosis of SARS-CoV-2 by FDA under an Emergency Use Authorization (EUA).  This EUA will remain in effect (meaning this test can be used) for the duration of the COVID-19 declaration under Section 564(b)(1) of the Act, 21 U.S.C. section 360bbb-3(b)(1), unless the authorization is terminated or revoked.  Performed at [facility], [address] [place], [place] [address]   Blood culture (routine x 2)     Status: None (Preliminary result)   Collection Time: 12/25/22  6:12 AM   Specimen: BLOOD RIGHT HAND  Result Value Ref Range Status   Specimen Description BLOOD RIGHT HAND  Final   Special Requests   Final    BOTTLES DRAWN AEROBIC ONLY Blood Culture adequate volume   Culture   Final    NO GROWTH 4 DAYS Performed at [facility], [address], [place], [place] [address]    Report Status PENDING  Incomplete  Blood culture (routine x 2)     Status: None (Preliminary result)   Collection Time: 12/25/22  6:25 AM   Specimen: BLOOD  Result Value Ref Range Status   Specimen Description BLOOD SITE NOT SPECIFIED  Final   Special Requests   Final    BOTTLES DRAWN AEROBIC AND ANAEROBIC Blood Culture adequate volume   Culture   Final    NO GROWTH 4 DAYS Performed at [facility], [address] [place], [place] [address]    Report Status PENDING  Incomplete         Radiology Studies: VAS [place] LOWER EXTREMITY VENOUS (DVT)  Result Date: 12/29/2022  Lower Venous DVT Study Patient Name:  JAHLAYA POLA  Date of Exam:   12/29/2022 Medical Rec #: [id number]                    Accession #:    [id number] Date of Birth: 02-15-1946                   Patient Gender: F Patient Age:   40 years Exam Location:  [facility] Procedure:      VAS [place] LOWER EXTREMITY VENOUS (DVT) Referring Phys: RALPH NETTEY --------------------------------------------------------------------------------  Indications: Pain, Swelling, and pulmonary  4:47 AM   Specimen: Anterior Nasal Swab  Result Value Ref Range Status   SARS Coronavirus 2 by RT PCR NEGATIVE NEGATIVE Final   Influenza Meklit Cotta by PCR NEGATIVE NEGATIVE Final   Influenza B by PCR NEGATIVE NEGATIVE Final    Comment: (NOTE) The Xpert Xpress SARS-CoV-2/FLU/RSV plus assay is intended as an aid in the diagnosis of influenza from Nasopharyngeal swab specimens and should not be used as Birttany Dechellis sole basis for treatment. Nasal washings and aspirates are unacceptable for Xpert Xpress SARS-CoV-2/FLU/RSV testing.  Fact Sheet for Patients: BloggerCourse.com  Fact Sheet for Healthcare Providers: SeriousBroker.it  This test is not yet approved or cleared by the [place] FDA and has been authorized for detection and/or diagnosis of SARS-CoV-2 by FDA under an Emergency Use Authorization (EUA). This EUA will remain in effect (meaning this test can be used) for the duration of the COVID-19 declaration under Section 564(b)(1) of the Act, 21 U.S.C. section 360bbb-3(b)(1), unless the authorization is terminated or revoked.     Resp Syncytial Virus by PCR NEGATIVE NEGATIVE Final    Comment: (NOTE) Fact Sheet for Patients: BloggerCourse.com  Fact Sheet for Healthcare Providers: SeriousBroker.it  This test is not yet approved or cleared by the [place] FDA and has been authorized for detection and/or diagnosis of SARS-CoV-2 by FDA under an Emergency Use Authorization (EUA).  This EUA will remain in effect (meaning this test can be used) for the duration of the COVID-19 declaration under Section 564(b)(1) of the Act, 21 U.S.C. section 360bbb-3(b)(1), unless the authorization is terminated or revoked.  Performed at [facility], [address] [place], [place] [address]   Blood culture (routine x 2)     Status: None (Preliminary result)   Collection Time: 12/25/22  6:12 AM   Specimen: BLOOD RIGHT HAND  Result Value Ref Range Status   Specimen Description BLOOD RIGHT HAND  Final   Special Requests   Final    BOTTLES DRAWN AEROBIC ONLY Blood Culture adequate volume   Culture   Final    NO GROWTH 4 DAYS Performed at [facility], [address], [place], [place] [address]    Report Status PENDING  Incomplete  Blood culture (routine x 2)     Status: None (Preliminary result)   Collection Time: 12/25/22  6:25 AM   Specimen: BLOOD  Result Value Ref Range Status   Specimen Description BLOOD SITE NOT SPECIFIED  Final   Special Requests   Final    BOTTLES DRAWN AEROBIC AND ANAEROBIC Blood Culture adequate volume   Culture   Final    NO GROWTH 4 DAYS Performed at [facility], [address] [place], [place] [address]    Report Status PENDING  Incomplete         Radiology Studies: VAS [place] LOWER EXTREMITY VENOUS (DVT)  Result Date: 12/29/2022  Lower Venous DVT Study Patient Name:  JAHLAYA POLA  Date of Exam:   12/29/2022 Medical Rec #: [id number]                    Accession #:    [id number] Date of Birth: 02-15-1946                   Patient Gender: F Patient Age:   40 years Exam Location:  [facility] Procedure:      VAS [place] LOWER EXTREMITY VENOUS (DVT) Referring Phys: RALPH NETTEY --------------------------------------------------------------------------------  Indications: Pain, Swelling, and pulmonary  Age Indeterminate    +---------+---------------+---------+-----------+----------+-------------------+ Unable to tolerate compression maneuvers due to pain in distal FV.    Summary: BILATERAL: -No evidence of popliteal cyst, bilaterally. RIGHT: - Findings consistent with age indeterminate deep vein thrombosis involving the right popliteal vein, right posterior tibial veins, and right peroneal veins.   LEFT: - Findings consistent with age indeterminate deep vein thrombosis involving the left popliteal vein, left posterior tibial veins, and left peroneal veins.   *See table(s) above for measurements and observations. Electronically signed by Sherald Hess MD on 12/29/2022 at 1:15:36 PM.    Final    ECHOCARDIOGRAM LIMITED  Result Date: 12/28/2022    ECHOCARDIOGRAM LIMITED REPORT   Patient Name:   ALEZANDRA ORTMANN Date of Exam: 12/28/2022 Medical Rec #:  [id number]                   Height:       67.0 in Accession #:    [id number]                  Weight:       253.1 lb Date of Birth:  1945/12/29                  BSA:          2.235 m Patient Age:    77 years                    BP:           117/75 mmHg Patient Gender: F                           HR:           112 bpm. Exam Location:  Inpatient Procedure: Limited Echo, Limited Color Doppler and Intracardiac Opacification            Agent Indications:    Pulmonary Embolus  History:        Patient has prior history of Echocardiogram examinations, most                 recent 12/28/2022. CHF and Cardiomyopathy, CAD, COPD,                 Arrythmias:Atrial Fibrillation; Risk Factors:Hypertension and                 Dyslipidemia. CKD, stage 3.                 Aortic Valve: 23 mm St. Jude Trifecta valve is present in the                 aortic position.  Sonographer:    Lucendia Herrlich RCS Referring Phys: [phone] RALPH Dusti Tetro NETTEY IMPRESSIONS  1. Very poor acoustical windows with very poor endocardial border definition even with definity contrast. Left ventricular ejection fraction  appears at least mildly reduced.  2. Right ventricular systolic function appears moderately reduced in some views. The right ventricular size is mildly dilated.  3. The mitral valve is normal in structure.  4. The aortic valve was not well visualized. Aortic valve regurgitation is not visualized. There is Jaire Pinkham 23 mm St. Jude Trifecta valve present in the aortic position. FINDINGS  Left Ventricle: Left ventricular ejection fraction, by estimation, is 40 to 45%. The left ventricle has mildly decreased function. The left ventricle demonstrates global hypokinesis. Definity contrast agent was given IV to delineate the left ventricular  4:47 AM   Specimen: Anterior Nasal Swab  Result Value Ref Range Status   SARS Coronavirus 2 by RT PCR NEGATIVE NEGATIVE Final   Influenza Meklit Cotta by PCR NEGATIVE NEGATIVE Final   Influenza B by PCR NEGATIVE NEGATIVE Final    Comment: (NOTE) The Xpert Xpress SARS-CoV-2/FLU/RSV plus assay is intended as an aid in the diagnosis of influenza from Nasopharyngeal swab specimens and should not be used as Birttany Dechellis sole basis for treatment. Nasal washings and aspirates are unacceptable for Xpert Xpress SARS-CoV-2/FLU/RSV testing.  Fact Sheet for Patients: BloggerCourse.com  Fact Sheet for Healthcare Providers: SeriousBroker.it  This test is not yet approved or cleared by the [place] FDA and has been authorized for detection and/or diagnosis of SARS-CoV-2 by FDA under an Emergency Use Authorization (EUA). This EUA will remain in effect (meaning this test can be used) for the duration of the COVID-19 declaration under Section 564(b)(1) of the Act, 21 U.S.C. section 360bbb-3(b)(1), unless the authorization is terminated or revoked.     Resp Syncytial Virus by PCR NEGATIVE NEGATIVE Final    Comment: (NOTE) Fact Sheet for Patients: BloggerCourse.com  Fact Sheet for Healthcare Providers: SeriousBroker.it  This test is not yet approved or cleared by the [place] FDA and has been authorized for detection and/or diagnosis of SARS-CoV-2 by FDA under an Emergency Use Authorization (EUA).  This EUA will remain in effect (meaning this test can be used) for the duration of the COVID-19 declaration under Section 564(b)(1) of the Act, 21 U.S.C. section 360bbb-3(b)(1), unless the authorization is terminated or revoked.  Performed at [facility], [address] [place], [place] [address]   Blood culture (routine x 2)     Status: None (Preliminary result)   Collection Time: 12/25/22  6:12 AM   Specimen: BLOOD RIGHT HAND  Result Value Ref Range Status   Specimen Description BLOOD RIGHT HAND  Final   Special Requests   Final    BOTTLES DRAWN AEROBIC ONLY Blood Culture adequate volume   Culture   Final    NO GROWTH 4 DAYS Performed at [facility], [address], [place], [place] [address]    Report Status PENDING  Incomplete  Blood culture (routine x 2)     Status: None (Preliminary result)   Collection Time: 12/25/22  6:25 AM   Specimen: BLOOD  Result Value Ref Range Status   Specimen Description BLOOD SITE NOT SPECIFIED  Final   Special Requests   Final    BOTTLES DRAWN AEROBIC AND ANAEROBIC Blood Culture adequate volume   Culture   Final    NO GROWTH 4 DAYS Performed at [facility], [address] [place], [place] [address]    Report Status PENDING  Incomplete         Radiology Studies: VAS [place] LOWER EXTREMITY VENOUS (DVT)  Result Date: 12/29/2022  Lower Venous DVT Study Patient Name:  JAHLAYA POLA  Date of Exam:   12/29/2022 Medical Rec #: [id number]                    Accession #:    [id number] Date of Birth: 02-15-1946                   Patient Gender: F Patient Age:   40 years Exam Location:  [facility] Procedure:      VAS [place] LOWER EXTREMITY VENOUS (DVT) Referring Phys: RALPH NETTEY --------------------------------------------------------------------------------  Indications: Pain, Swelling, and pulmonary

## 2022-12-29 NOTE — Progress Notes (Signed)
Occupational Therapy Treatment Patient Details Name: Danielle [name] MRN: [id number] DOB: 03-30-1945 Today's Date: 12/29/2022   History of present illness Pt is 77 year old presented to [facility] on  12/25/22 for acute respiratory failure with hypoxia and copd exacerbation. VQ scan confirms PE 10/28. PMH - anxiety, breast cancer, COPD, CHF, CAD, HTN, and CVA with residual RUE weakness, Lt hip fx with IM nail   OT comments  Pt progressing well in OT session, ambulating a total of ~44ft with 1 seated rest break needed, pt stating need for rest breaks prn to maintain energy levels. During session pt did well with maintaining Sp02 levels,  min cues for pursed lip breathing. Pt was very motivated and pushed herself to ambulate several feet beyond her doorway, reports this was her first time. OT to continue to progress pt as able. DC plans updated, unsure of help at home.       If plan is discharge home, recommend the following:  A little help with walking and/or transfers;A lot of help with bathing/dressing/bathroom   Equipment Recommendations  None recommended by OT    Recommendations for Other Services      Precautions / Restrictions Precautions Precautions: Fall;Other (comment) Precaution Comments: watch SpO2. HR and SOB Restrictions Weight Bearing Restrictions: No       Mobility Bed Mobility Overal bed mobility: Needs Assistance Bed Mobility: Supine to Sit     Supine to sit: Contact guard, HOB elevated, Used rails     General bed mobility comments: increased time but able to perform without physical assist    Transfers Overall transfer level: Needs assistance Equipment used: Rolling walker (2 wheels) Transfers: Sit to/from Stand, Bed to chair/wheelchair/BSC Sit to Stand: Contact guard assist           General transfer comment: STS from EOB CGA, slow to rise and cues to keep chest up and for hand placement. Min A from recliner (2 stands).     Balance  Overall balance assessment: Needs assistance Sitting-balance support: No upper extremity supported, Feet supported Sitting balance-Leahy Scale: Fair     Standing balance support: Bilateral upper extremity supported, During functional activity, Reliant on assistive device for balance Standing balance-Leahy Scale: Poor Standing balance comment: Reliant on RW                           ADL either performed or assessed with clinical judgement   ADL Overall ADL's : Needs assistance/impaired                                     Functional mobility during ADLs: Rolling walker (2 wheels);Contact guard assist (chair follow for gait) General ADL Comments: Reinforced energy conservation strategies prn, focused session on optimizing mobility while maintaining precautions.    Extremity/Trunk Assessment              Vision       Perception     Praxis      Cognition Arousal: Alert Behavior During Therapy: WFL for tasks assessed/performed Overall Cognitive Status: Within Functional Limits for tasks assessed                                          Exercises      Shoulder Instructions  General Comments Pt noted to not be on supplemental 02 upon entry with Sp02 >95%, during ambulation pt Sp02 noted to stay mostly 92% and above wiht a brief desat to 89% but unsure of pleth accuracy. HR stable 100-120bpm    Pertinent Vitals/ Pain       Pain Assessment Pain Assessment: No/denies pain  Home Living                                          Prior Functioning/Environment              Frequency  Min 1X/week        Progress Toward Goals  OT Goals(current goals can now be found in the care plan section)  Progress towards OT goals: Progressing toward goals  Acute Rehab OT Goals OT Goal Formulation: With patient Time For Goal Achievement: 01/10/23 Potential to Achieve Goals: Good  Plan       Co-evaluation                 AM-PAC OT "6 Clicks" Daily Activity     Outcome Measure   Help from another person eating meals?: None Help from another person taking care of personal grooming?: A Little Help from another person toileting, which includes using toliet, bedpan, or urinal?: A Lot Help from another person bathing (including washing, rinsing, drying)?: A Lot Help from another person to put on and taking off regular upper body clothing?: A Little Help from another person to put on and taking off regular lower body clothing?: A Lot 6 Click Score: 16    End of Session Equipment Utilized During Treatment: Rolling walker (2 wheels);Gait belt  OT Visit Diagnosis: Unsteadiness on feet (R26.81);Other abnormalities of gait and mobility (R26.89);Muscle weakness (generalized) (M62.81)   Activity Tolerance Patient tolerated treatment well   Patient Left in chair;with call bell/phone within reach   Nurse Communication Mobility status        Time: 5784-6962 OT Time Calculation (min): 31 min  Charges: OT General Charges $OT Visit: 1 Visit OT Treatments $Therapeutic Activity: 23-37 mins  12/29/2022  AB, OTR/L  Acute Rehabilitation Services  Office: [phone]   Tristan Schroeder 12/29/2022, 6:00 PM

## 2022-12-29 NOTE — Plan of Care (Signed)

## 2022-12-29 NOTE — Progress Notes (Signed)
Mobility Specialist Progress Note:   12/29/22 1113  Mobility  Activity Transferred from chair to bed  Level of Assistance Minimal assist, patient does 75% or more  Assistive Device Front wheel walker  Distance Ambulated (ft) 4 ft  Activity Response Tolerated well  Mobility Referral Yes  $Mobility charge 1 Mobility  Mobility Specialist Start Time (ACUTE ONLY) 1100  Mobility Specialist Stop Time (ACUTE ONLY) 1108  Mobility Specialist Time Calculation (min) (ACUTE ONLY) 8 min    Pt received in chair, needing to return to bed to be transported to vascular. MinA to stand and pivot to bed. Asymptomatic w/ no complaints throughout. Pt left in bed with all needs met.  [place]'Vante Earlene Plater Mobility Specialist Please contact via Special educational needs teacher or Rehab office at [phone]

## 2022-12-29 NOTE — Progress Notes (Signed)
Mobility Specialist Progress Note:   12/29/22 1100  Mobility  Activity Transferred to/from [facility];Transferred from bed to chair  Level of Assistance Moderate assist, patient does 50-74%  Assistive Device Front wheel walker  Distance Ambulated (ft) 8 ft  Activity Response Tolerated fair  Mobility Referral Yes  $Mobility charge 1 Mobility  Mobility Specialist Start Time (ACUTE ONLY) 1038  Mobility Specialist Stop Time (ACUTE ONLY) 1050  Mobility Specialist Time Calculation (min) (ACUTE ONLY) 12 min    Pt received on EOB, agreeable to mobility. Had sudden urge to use BSC. Voided some in bed before reaching BSC. NT assisted in pericare. Pt transferred to chair with some DOE, though VSS throughout. Pt left in chair with call bell and all needs met.  D'Vante Earlene Plater Mobility Specialist Please contact via Special educational needs teacher or Rehab office at [phone]

## 2022-12-29 NOTE — Plan of Care (Signed)

## 2022-12-29 NOTE — Progress Notes (Signed)
Bilateral lower extremity venous duplex completed. Results in Epic.   [facility], RVT

## 2022-12-30 DIAGNOSIS — J9601 Acute respiratory failure with hypoxia: Secondary | ICD-10-CM | POA: Diagnosis not present

## 2022-12-30 LAB — COMPREHENSIVE METABOLIC PANEL
ALT: 56 U/L — ABNORMAL HIGH (ref 0–44)
AST: 20 U/L (ref 15–41)
Albumin: 2.5 g/dL — ABNORMAL LOW (ref 3.5–5.0)
Alkaline Phosphatase: 63 U/L (ref 38–126)
Anion gap: 9 (ref 5–15)
BUN: 17 mg/dL (ref 8–23)
CO2: 18 mmol/L — ABNORMAL LOW (ref 22–32)
Calcium: 9 mg/dL (ref 8.9–10.3)
Chloride: 109 mmol/L (ref 98–111)
Creatinine, Ser: 1.3 mg/dL — ABNORMAL HIGH (ref 0.44–1.00)
GFR, Estimated: 43 mL/min — ABNORMAL LOW (ref >60)
Glucose, Bld: 82 mg/dL (ref 70–99)
Potassium: 4 mmol/L (ref 3.5–5.1)
Sodium: 136 mmol/L (ref 135–145)
Total Bilirubin: 0.8 mg/dL (ref 0.3–1.2)
Total Protein: 5.4 g/dL — ABNORMAL LOW (ref 6.5–8.1)

## 2022-12-30 LAB — CBC WITH DIFFERENTIAL/PLATELET
Abs Immature Granulocytes: 0.19 10*3/uL — ABNORMAL HIGH (ref 0.00–0.07)
Basophils Absolute: 0 10*3/uL (ref 0.0–0.1)
Basophils Relative: 1 %
Eosinophils Absolute: 0.4 10*3/uL (ref 0.0–0.5)
Eosinophils Relative: 6 %
HCT: 37 % (ref 36.0–46.0)
Hemoglobin: 12.5 g/dL (ref 12.0–15.0)
Immature Granulocytes: 3 %
Lymphocytes Relative: 17 %
Lymphs Abs: 1.3 10*3/uL (ref 0.7–4.0)
MCH: 31.8 pg (ref 26.0–34.0)
MCHC: 33.8 g/dL (ref 30.0–36.0)
MCV: 94.1 fL (ref 80.0–100.0)
Monocytes Absolute: 0.6 10*3/uL (ref 0.1–1.0)
Monocytes Relative: 9 %
Neutro Abs: 4.9 10*3/uL (ref 1.7–7.7)
Neutrophils Relative %: 64 %
Platelets: 82 10*3/uL — ABNORMAL LOW (ref 150–400)
RBC: 3.93 MIL/uL (ref 3.87–5.11)
RDW: 13.3 % (ref 11.5–15.5)
WBC: 7.5 10*3/uL (ref 4.0–10.5)
nRBC: 0.4 % — ABNORMAL HIGH (ref 0.0–0.2)

## 2022-12-30 LAB — CULTURE, BLOOD (ROUTINE X 2)
Culture: NO GROWTH
Culture: NO GROWTH
Special Requests: ADEQUATE
Special Requests: ADEQUATE

## 2022-12-30 LAB — MAGNESIUM: Magnesium: 1.8 mg/dL (ref 1.7–2.4)

## 2022-12-30 LAB — PHOSPHORUS: Phosphorus: 3.4 mg/dL (ref 2.5–4.6)

## 2022-12-30 NOTE — TOC Progression Note (Signed)
Transition of Care [facility]) - Progression Note    Patient Details  Name: Danielle [name] MRN: [id number] Date of Birth: 07/15/1945  [facility] of [facility]) CM/SW Contact  Delilah Shan, LCSWA Phone Number: 12/30/2022, 11:37 AM  Clinical Narrative:     CSW spoke with Bjorn Loser with [facility] who informed CSW patients insurance authorization is still pending. CSW will continue to follow and assist with patients dc planning needs.  Expected Discharge Plan: Skilled Nursing Facility Barriers to Discharge: Continued Medical Work up  Expected Discharge Plan and Services In-house Referral: Clinical Social Work Discharge Planning Services: CM Consult Post Acute Care Choice: Skilled Nursing Facility Living arrangements for the past 2 months: Single Family Home                           HH Arranged: PT, OT HH Agency: [facility] Date [facility] Agency Contacted: 12/27/22 Time HH Agency Contacted: 1423 Representative spoke with at [facility] Agency: Kandee Keen   Social Determinants of Health (SDOH) Interventions SDOH Screenings   Food Insecurity: No Food Insecurity (12/25/2022)  Housing: Low Risk  (12/25/2022)  Transportation Needs: No Transportation Needs (12/25/2022)  Utilities: Not At Risk (12/25/2022)  Alcohol Screen: Low Risk  (02/01/2022)  Depression (PHQ2-9): Low Risk  (06/04/2022)  Recent Concern: Depression (PHQ2-9) - Medium Risk (04/09/2022)  Financial Resource Strain: Low Risk  (02/01/2022)  Recent Concern: Financial Resource Strain - Medium Risk (01/25/2022)  Physical Activity: Inactive (02/01/2022)  Social Connections: Socially Integrated (02/01/2022)  Stress: No Stress Concern Present (02/01/2022)  Tobacco Use: Medium Risk (12/27/2022)    Readmission Risk Interventions     No data to display

## 2022-12-30 NOTE — Plan of Care (Signed)
Received patient at 3pm and has remained stable. No complaints of pain. Possible DC  11/1

## 2022-12-30 NOTE — Progress Notes (Signed)
PROGRESS NOTE    Danielle [name]  ZOX:[id number] DOB: 1945-03-03 DOA: 12/25/2022 PCP: [place], [name], MD  Chief Complaint  Patient presents with   Shortness of Breath    Patient to ED via ems with complaint of sob lower 70% range on room air having difficulty speaking. Patient was found to be pale and diaphoretic. EMS reports patient was hypotensive with [name] BP 70/40. EMS reports giving patient 150 mL NS through [name] 22 gauge IV in left wrist.EMS reports placing [name] NRB on patient which improved O2 saturation into 90% range.    Brief Narrative:   Danielle [name] is Danielle [name] 77 y.o. female with [name] history of hypertension, hyperlipidemia, CAD, heart failure with reduced EF, paroxysmal atrial fibrillation, COPD, hyperthyroidism, thrombocytopenia, GERD, obesity. Patient presented secondary to shortness of breath with hypoxia and found to have evidence of sepsis with presumed pneumonia. Also concern for possible acute PE, started on Heparin IV. Blood cultures obtained and antibiotics started. V/Q scan obtained on 10/28 and confirms presence of [name] pulmonary embolism.   Assessment & Plan:   Principal Problem:   Acute respiratory failure with hypoxia (HCC) Active Problems:   SIRS (systemic inflammatory response syndrome) (HCC)   Elevated troponin   Acute kidney injury superimposed on chronic kidney disease (HCC)   Nausea, vomiting, and diarrhea   Essential hypertension   Paroxysmal atrial fibrillation (HCC)   Chronic HFrEF (heart failure with reduced ejection fraction) (HCC)   Hyperthyroidism   Obesity (BMI 30-39.9)   S/P aortic valve replacement with bioprosthetic valve   Acute pulmonary embolism (HCC)  Severe sepsis Present on admission with presumed pneumonia. Blood cultures obtained. Antibiotics initiated for pneumonia. Blood cultures no growth x4 days. -Continue antibiotics -Follow-up blood cultures   Presumed community acquired pneumonia Patient with hypoxia and fever  with x-ray evidence suggesting possible left lung base infection. Patient started empirically on Ceftriaxone and azithromycin. -Cefdinir and Azithromycin PO to complete course   COPD exacerbation Present on admission. Patient treated with steroids and breathing treatments. No wheezing noted. -Continue Duonebs, Brovana and budesonide   Acute respiratory failure with hypoxia Likely related to pulmonary embolism. Patient has required up to non-rebreather, now down to room air at rest. Patient continues to require up to 2 L/min with ambulation. - wean o2 as tolerated - currently on RA -Ambulatory pulse ox prior to discharge   Acute pulmonary embolism Bilateral LE DVT's Patient with elevated D-dimer measuring >20. V/Q scan confirms present of Tamilyn Lupien pulmonary embolism. Associated demand ischemia that could indicate Lotoya Casella level of heart strain. Initial Transthoracic Echocardiogram did not well visualize the RV. -Transition to Eliquis today -Follow-up limited Transthoracic Echocardiogram for RV evaluation -> appears moderately reduced in some views (will need repeat US within 3 months) - hemodynamically stable  -Check LE venous duplex (with bilateral age indeterminate DVT's involving bilateral popliteal, posterior tibial, and peroneal veins)   Elevated troponin Presumed demand ischemia. No chest pain. Transthoracic Echocardiogram ordered and shows slightly more reduced LVEF of 40-45%.  Likely secondary to pulmonary embolism. Needs cardiology follow up    AKI on CKD stage IIIb Baseline creatinine of about 1.6. Creatinine of 3.13 on admission with peak of 3.40. Creatinine improved to 1.3. AKI resolved.   Nausea/vomiting/diarrhea CT abdomen/pelvis without evidence of acute inflammation. resolved   Primary hypertension Noted. Patient is on spironolactone and Coreg as an outpatient.   Paroxysmal atrial fibrillation Noted. Patient is on Coreg and Eliquis as an outpatient.   Chronic heart failure with  reduced ejection fracture  PROGRESS NOTE    Danielle [name]  ZOX:[id number] DOB: 1945-03-03 DOA: 12/25/2022 PCP: [place], [name], MD  Chief Complaint  Patient presents with   Shortness of Breath    Patient to ED via ems with complaint of sob lower 70% range on room air having difficulty speaking. Patient was found to be pale and diaphoretic. EMS reports patient was hypotensive with [name] BP 70/40. EMS reports giving patient 150 mL NS through [name] 22 gauge IV in left wrist.EMS reports placing [name] NRB on patient which improved O2 saturation into 90% range.    Brief Narrative:   Danielle [name] is Danielle [name] 77 y.o. female with [name] history of hypertension, hyperlipidemia, CAD, heart failure with reduced EF, paroxysmal atrial fibrillation, COPD, hyperthyroidism, thrombocytopenia, GERD, obesity. Patient presented secondary to shortness of breath with hypoxia and found to have evidence of sepsis with presumed pneumonia. Also concern for possible acute PE, started on Heparin IV. Blood cultures obtained and antibiotics started. V/Q scan obtained on 10/28 and confirms presence of [name] pulmonary embolism.   Assessment & Plan:   Principal Problem:   Acute respiratory failure with hypoxia (HCC) Active Problems:   SIRS (systemic inflammatory response syndrome) (HCC)   Elevated troponin   Acute kidney injury superimposed on chronic kidney disease (HCC)   Nausea, vomiting, and diarrhea   Essential hypertension   Paroxysmal atrial fibrillation (HCC)   Chronic HFrEF (heart failure with reduced ejection fraction) (HCC)   Hyperthyroidism   Obesity (BMI 30-39.9)   S/P aortic valve replacement with bioprosthetic valve   Acute pulmonary embolism (HCC)  Severe sepsis Present on admission with presumed pneumonia. Blood cultures obtained. Antibiotics initiated for pneumonia. Blood cultures no growth x4 days. -Continue antibiotics -Follow-up blood cultures   Presumed community acquired pneumonia Patient with hypoxia and fever  with x-ray evidence suggesting possible left lung base infection. Patient started empirically on Ceftriaxone and azithromycin. -Cefdinir and Azithromycin PO to complete course   COPD exacerbation Present on admission. Patient treated with steroids and breathing treatments. No wheezing noted. -Continue Duonebs, Brovana and budesonide   Acute respiratory failure with hypoxia Likely related to pulmonary embolism. Patient has required up to non-rebreather, now down to room air at rest. Patient continues to require up to 2 L/min with ambulation. - wean o2 as tolerated - currently on RA -Ambulatory pulse ox prior to discharge   Acute pulmonary embolism Bilateral LE DVT's Patient with elevated D-dimer measuring >20. V/Q scan confirms present of Tamilyn Lupien pulmonary embolism. Associated demand ischemia that could indicate Lotoya Casella level of heart strain. Initial Transthoracic Echocardiogram did not well visualize the RV. -Transition to Eliquis today -Follow-up limited Transthoracic Echocardiogram for RV evaluation -> appears moderately reduced in some views (will need repeat US within 3 months) - hemodynamically stable  -Check LE venous duplex (with bilateral age indeterminate DVT's involving bilateral popliteal, posterior tibial, and peroneal veins)   Elevated troponin Presumed demand ischemia. No chest pain. Transthoracic Echocardiogram ordered and shows slightly more reduced LVEF of 40-45%.  Likely secondary to pulmonary embolism. Needs cardiology follow up    AKI on CKD stage IIIb Baseline creatinine of about 1.6. Creatinine of 3.13 on admission with peak of 3.40. Creatinine improved to 1.3. AKI resolved.   Nausea/vomiting/diarrhea CT abdomen/pelvis without evidence of acute inflammation. resolved   Primary hypertension Noted. Patient is on spironolactone and Coreg as an outpatient.   Paroxysmal atrial fibrillation Noted. Patient is on Coreg and Eliquis as an outpatient.   Chronic heart failure with  reduced ejection fracture  0.8  PROT 7.2 5.4*  ALBUMIN 3.6 2.5*    CBG: No results for input(s): "GLUCAP" in the last 168 hours.   Recent Results (from the past 240 hour(s))  Resp panel by RT-PCR (RSV, Flu Lennyn Bellanca&B, Covid) Anterior Nasal Swab     Status: None   Collection Time: 12/25/22  4:47 AM   Specimen: Anterior Nasal Swab  Result Value Ref Range Status   SARS Coronavirus 2 by RT PCR NEGATIVE NEGATIVE Final   Influenza Lanell Carpenter by PCR NEGATIVE NEGATIVE Final   Influenza B by PCR NEGATIVE NEGATIVE Final    Comment: (NOTE) The Xpert Xpress SARS-CoV-2/FLU/RSV plus assay is intended as an aid in the diagnosis of influenza from Nasopharyngeal swab specimens and should not be used as [place]al[place]ani Maiorino sole basis for treatment. Nasal washings and aspirates are unacceptable for Xpert Xpress SARS-CoV-2/FLU/RSV testing.  Fact Sheet for Patients: BloggerCourse.com  Fact Sheet for Healthcare Providers: SeriousBroker.it  This test is not yet approved or cleared by the [place] FDA and has been authorized for detection and/or diagnosis of SARS-CoV-2 by FDA under an Emergency Use Authorization (EUA). This EUA will remain in effect (meaning this test can be used) for the duration of the COVID-19 declaration under Section 564(b)(1) of the Act, 21 U.S.C. section 360bbb-3(b)(1), unless the authorization is terminated or revoked.     Resp Syncytial Virus by PCR NEGATIVE NEGATIVE Final    Comment: (NOTE) Fact Sheet for Patients: BloggerCourse.com  Fact Sheet for Healthcare  Providers: SeriousBroker.it  This test is not yet approved or cleared by the [place] FDA and has been authorized for detection and/or diagnosis of SARS-CoV-2 by FDA under an Emergency Use Authorization (EUA). This EUA will remain in effect (meaning this test can be used) for the duration of the COVID-19 declaration under Section 564(b)(1) of the Act, 21 U.S.C. section 360bbb-3(b)(1), unless the authorization is terminated or revoked.  Performed at [facility], [address], [place], [place] [address]   Blood culture (routine x 2)     Status: None   Collection Time: 12/25/22  6:12 AM   Specimen: BLOOD RIGHT HAND  Result Value Ref Range Status   Specimen Description BLOOD RIGHT HAND  Final   Special Requests   Final    BOTTLES DRAWN AEROBIC ONLY Blood Culture adequate volume   Culture   Final    NO GROWTH 5 DAYS Performed at [facility], [address], [place], [place] [address]    Report Status 12/30/2022 FINAL  Final  Blood culture (routine x 2)     Status: None   Collection Time: 12/25/22  6:25 AM   Specimen: BLOOD  Result Value Ref Range Status   Specimen Description BLOOD SITE NOT SPECIFIED  Final   Special Requests   Final    BOTTLES DRAWN AEROBIC AND ANAEROBIC Blood Culture adequate volume   Culture   Final    NO GROWTH 5 DAYS Performed at [facility], [address] [place], [place] [address]    Report Status 12/30/2022 FINAL  Final         Radiology Studies: VAS [place] LOWER EXTREMITY VENOUS (DVT)  Result Date: 12/29/2022  Lower Venous DVT Study Patient Name:  TEANNA CARLI  Date of Exam:   12/29/2022 Medical Rec #: [id number]                    Accession #:    [id number] Date of Birth: Mar 26, 1945  PROGRESS NOTE    Danielle [name]  ZOX:[id number] DOB: 1945-03-03 DOA: 12/25/2022 PCP: [place], [name], MD  Chief Complaint  Patient presents with   Shortness of Breath    Patient to ED via ems with complaint of sob lower 70% range on room air having difficulty speaking. Patient was found to be pale and diaphoretic. EMS reports patient was hypotensive with [name] BP 70/40. EMS reports giving patient 150 mL NS through [name] 22 gauge IV in left wrist.EMS reports placing [name] NRB on patient which improved O2 saturation into 90% range.    Brief Narrative:   Danielle [name] is Danielle [name] 77 y.o. female with [name] history of hypertension, hyperlipidemia, CAD, heart failure with reduced EF, paroxysmal atrial fibrillation, COPD, hyperthyroidism, thrombocytopenia, GERD, obesity. Patient presented secondary to shortness of breath with hypoxia and found to have evidence of sepsis with presumed pneumonia. Also concern for possible acute PE, started on Heparin IV. Blood cultures obtained and antibiotics started. V/Q scan obtained on 10/28 and confirms presence of [name] pulmonary embolism.   Assessment & Plan:   Principal Problem:   Acute respiratory failure with hypoxia (HCC) Active Problems:   SIRS (systemic inflammatory response syndrome) (HCC)   Elevated troponin   Acute kidney injury superimposed on chronic kidney disease (HCC)   Nausea, vomiting, and diarrhea   Essential hypertension   Paroxysmal atrial fibrillation (HCC)   Chronic HFrEF (heart failure with reduced ejection fraction) (HCC)   Hyperthyroidism   Obesity (BMI 30-39.9)   S/P aortic valve replacement with bioprosthetic valve   Acute pulmonary embolism (HCC)  Severe sepsis Present on admission with presumed pneumonia. Blood cultures obtained. Antibiotics initiated for pneumonia. Blood cultures no growth x4 days. -Continue antibiotics -Follow-up blood cultures   Presumed community acquired pneumonia Patient with hypoxia and fever  with x-ray evidence suggesting possible left lung base infection. Patient started empirically on Ceftriaxone and azithromycin. -Cefdinir and Azithromycin PO to complete course   COPD exacerbation Present on admission. Patient treated with steroids and breathing treatments. No wheezing noted. -Continue Duonebs, Brovana and budesonide   Acute respiratory failure with hypoxia Likely related to pulmonary embolism. Patient has required up to non-rebreather, now down to room air at rest. Patient continues to require up to 2 L/min with ambulation. - wean o2 as tolerated - currently on RA -Ambulatory pulse ox prior to discharge   Acute pulmonary embolism Bilateral LE DVT's Patient with elevated D-dimer measuring >20. V/Q scan confirms present of Tamilyn Lupien pulmonary embolism. Associated demand ischemia that could indicate Lotoya Casella level of heart strain. Initial Transthoracic Echocardiogram did not well visualize the RV. -Transition to Eliquis today -Follow-up limited Transthoracic Echocardiogram for RV evaluation -> appears moderately reduced in some views (will need repeat US within 3 months) - hemodynamically stable  -Check LE venous duplex (with bilateral age indeterminate DVT's involving bilateral popliteal, posterior tibial, and peroneal veins)   Elevated troponin Presumed demand ischemia. No chest pain. Transthoracic Echocardiogram ordered and shows slightly more reduced LVEF of 40-45%.  Likely secondary to pulmonary embolism. Needs cardiology follow up    AKI on CKD stage IIIb Baseline creatinine of about 1.6. Creatinine of 3.13 on admission with peak of 3.40. Creatinine improved to 1.3. AKI resolved.   Nausea/vomiting/diarrhea CT abdomen/pelvis without evidence of acute inflammation. resolved   Primary hypertension Noted. Patient is on spironolactone and Coreg as an outpatient.   Paroxysmal atrial fibrillation Noted. Patient is on Coreg and Eliquis as an outpatient.   Chronic heart failure with  reduced ejection fracture  0.8  PROT 7.2 5.4*  ALBUMIN 3.6 2.5*    CBG: No results for input(s): "GLUCAP" in the last 168 hours.   Recent Results (from the past 240 hour(s))  Resp panel by RT-PCR (RSV, Flu Lennyn Bellanca&B, Covid) Anterior Nasal Swab     Status: None   Collection Time: 12/25/22  4:47 AM   Specimen: Anterior Nasal Swab  Result Value Ref Range Status   SARS Coronavirus 2 by RT PCR NEGATIVE NEGATIVE Final   Influenza Lanell Carpenter by PCR NEGATIVE NEGATIVE Final   Influenza B by PCR NEGATIVE NEGATIVE Final    Comment: (NOTE) The Xpert Xpress SARS-CoV-2/FLU/RSV plus assay is intended as an aid in the diagnosis of influenza from Nasopharyngeal swab specimens and should not be used as [place]al[place]ani Maiorino sole basis for treatment. Nasal washings and aspirates are unacceptable for Xpert Xpress SARS-CoV-2/FLU/RSV testing.  Fact Sheet for Patients: BloggerCourse.com  Fact Sheet for Healthcare Providers: SeriousBroker.it  This test is not yet approved or cleared by the [place] FDA and has been authorized for detection and/or diagnosis of SARS-CoV-2 by FDA under an Emergency Use Authorization (EUA). This EUA will remain in effect (meaning this test can be used) for the duration of the COVID-19 declaration under Section 564(b)(1) of the Act, 21 U.S.C. section 360bbb-3(b)(1), unless the authorization is terminated or revoked.     Resp Syncytial Virus by PCR NEGATIVE NEGATIVE Final    Comment: (NOTE) Fact Sheet for Patients: BloggerCourse.com  Fact Sheet for Healthcare  Providers: SeriousBroker.it  This test is not yet approved or cleared by the [place] FDA and has been authorized for detection and/or diagnosis of SARS-CoV-2 by FDA under an Emergency Use Authorization (EUA). This EUA will remain in effect (meaning this test can be used) for the duration of the COVID-19 declaration under Section 564(b)(1) of the Act, 21 U.S.C. section 360bbb-3(b)(1), unless the authorization is terminated or revoked.  Performed at [facility], [address], [place], [place] [address]   Blood culture (routine x 2)     Status: None   Collection Time: 12/25/22  6:12 AM   Specimen: BLOOD RIGHT HAND  Result Value Ref Range Status   Specimen Description BLOOD RIGHT HAND  Final   Special Requests   Final    BOTTLES DRAWN AEROBIC ONLY Blood Culture adequate volume   Culture   Final    NO GROWTH 5 DAYS Performed at [facility], [address], [place], [place] [address]    Report Status 12/30/2022 FINAL  Final  Blood culture (routine x 2)     Status: None   Collection Time: 12/25/22  6:25 AM   Specimen: BLOOD  Result Value Ref Range Status   Specimen Description BLOOD SITE NOT SPECIFIED  Final   Special Requests   Final    BOTTLES DRAWN AEROBIC AND ANAEROBIC Blood Culture adequate volume   Culture   Final    NO GROWTH 5 DAYS Performed at [facility], [address] [place], [place] [address]    Report Status 12/30/2022 FINAL  Final         Radiology Studies: VAS [place] LOWER EXTREMITY VENOUS (DVT)  Result Date: 12/29/2022  Lower Venous DVT Study Patient Name:  TEANNA CARLI  Date of Exam:   12/29/2022 Medical Rec #: [id number]                    Accession #:    [id number] Date of Birth: Mar 26, 1945  0.8  PROT 7.2 5.4*  ALBUMIN 3.6 2.5*    CBG: No results for input(s): "GLUCAP" in the last 168 hours.   Recent Results (from the past 240 hour(s))  Resp panel by RT-PCR (RSV, Flu Lennyn Bellanca&B, Covid) Anterior Nasal Swab     Status: None   Collection Time: 12/25/22  4:47 AM   Specimen: Anterior Nasal Swab  Result Value Ref Range Status   SARS Coronavirus 2 by RT PCR NEGATIVE NEGATIVE Final   Influenza Lanell Carpenter by PCR NEGATIVE NEGATIVE Final   Influenza B by PCR NEGATIVE NEGATIVE Final    Comment: (NOTE) The Xpert Xpress SARS-CoV-2/FLU/RSV plus assay is intended as an aid in the diagnosis of influenza from Nasopharyngeal swab specimens and should not be used as [place]ani Maiorino sole basis for treatment. Nasal washings and aspirates are unacceptable for Xpert Xpress SARS-CoV-2/FLU/RSV testing.  Fact Sheet for Patients: BloggerCourse.com  Fact Sheet for Healthcare Providers: SeriousBroker.it  This test is not yet approved or cleared by the [place] FDA and has been authorized for detection and/or diagnosis of SARS-CoV-2 by FDA under an Emergency Use Authorization (EUA). This EUA will remain in effect (meaning this test can be used) for the duration of the COVID-19 declaration under Section 564(b)(1) of the Act, 21 U.S.C. section 360bbb-3(b)(1), unless the authorization is terminated or revoked.     Resp Syncytial Virus by PCR NEGATIVE NEGATIVE Final    Comment: (NOTE) Fact Sheet for Patients: BloggerCourse.com  Fact Sheet for Healthcare  Providers: SeriousBroker.it  This test is not yet approved or cleared by the [place] FDA and has been authorized for detection and/or diagnosis of SARS-CoV-2 by FDA under an Emergency Use Authorization (EUA). This EUA will remain in effect (meaning this test can be used) for the duration of the COVID-19 declaration under Section 564(b)(1) of the Act, 21 U.S.C. section 360bbb-3(b)(1), unless the authorization is terminated or revoked.  Performed at [facility], [address], [place], [place] [address]   Blood culture (routine x 2)     Status: None   Collection Time: 12/25/22  6:12 AM   Specimen: BLOOD RIGHT HAND  Result Value Ref Range Status   Specimen Description BLOOD RIGHT HAND  Final   Special Requests   Final    BOTTLES DRAWN AEROBIC ONLY Blood Culture adequate volume   Culture   Final    NO GROWTH 5 DAYS Performed at [facility], [address], [place], [place] [address]    Report Status 12/30/2022 FINAL  Final  Blood culture (routine x 2)     Status: None   Collection Time: 12/25/22  6:25 AM   Specimen: BLOOD  Result Value Ref Range Status   Specimen Description BLOOD SITE NOT SPECIFIED  Final   Special Requests   Final    BOTTLES DRAWN AEROBIC AND ANAEROBIC Blood Culture adequate volume   Culture   Final    NO GROWTH 5 DAYS Performed at [facility], [address] [place], [place] [address]    Report Status 12/30/2022 FINAL  Final         Radiology Studies: VAS [place] LOWER EXTREMITY VENOUS (DVT)  Result Date: 12/29/2022  Lower Venous DVT Study Patient Name:  TEANNA CARLI  Date of Exam:   12/29/2022 Medical Rec #: [id number]                    Accession #:    [id number] Date of Birth: Mar 26, 1945  0.8  PROT 7.2 5.4*  ALBUMIN 3.6 2.5*    CBG: No results for input(s): "GLUCAP" in the last 168 hours.   Recent Results (from the past 240 hour(s))  Resp panel by RT-PCR (RSV, Flu Lennyn Bellanca&B, Covid) Anterior Nasal Swab     Status: None   Collection Time: 12/25/22  4:47 AM   Specimen: Anterior Nasal Swab  Result Value Ref Range Status   SARS Coronavirus 2 by RT PCR NEGATIVE NEGATIVE Final   Influenza Lanell Carpenter by PCR NEGATIVE NEGATIVE Final   Influenza B by PCR NEGATIVE NEGATIVE Final    Comment: (NOTE) The Xpert Xpress SARS-CoV-2/FLU/RSV plus assay is intended as an aid in the diagnosis of influenza from Nasopharyngeal swab specimens and should not be used as [place]ani Maiorino sole basis for treatment. Nasal washings and aspirates are unacceptable for Xpert Xpress SARS-CoV-2/FLU/RSV testing.  Fact Sheet for Patients: BloggerCourse.com  Fact Sheet for Healthcare Providers: SeriousBroker.it  This test is not yet approved or cleared by the [place] FDA and has been authorized for detection and/or diagnosis of SARS-CoV-2 by FDA under an Emergency Use Authorization (EUA). This EUA will remain in effect (meaning this test can be used) for the duration of the COVID-19 declaration under Section 564(b)(1) of the Act, 21 U.S.C. section 360bbb-3(b)(1), unless the authorization is terminated or revoked.     Resp Syncytial Virus by PCR NEGATIVE NEGATIVE Final    Comment: (NOTE) Fact Sheet for Patients: BloggerCourse.com  Fact Sheet for Healthcare  Providers: SeriousBroker.it  This test is not yet approved or cleared by the [place] FDA and has been authorized for detection and/or diagnosis of SARS-CoV-2 by FDA under an Emergency Use Authorization (EUA). This EUA will remain in effect (meaning this test can be used) for the duration of the COVID-19 declaration under Section 564(b)(1) of the Act, 21 U.S.C. section 360bbb-3(b)(1), unless the authorization is terminated or revoked.  Performed at [facility], [address], [place], [place] [address]   Blood culture (routine x 2)     Status: None   Collection Time: 12/25/22  6:12 AM   Specimen: BLOOD RIGHT HAND  Result Value Ref Range Status   Specimen Description BLOOD RIGHT HAND  Final   Special Requests   Final    BOTTLES DRAWN AEROBIC ONLY Blood Culture adequate volume   Culture   Final    NO GROWTH 5 DAYS Performed at [facility], [address], [place], [place] [address]    Report Status 12/30/2022 FINAL  Final  Blood culture (routine x 2)     Status: None   Collection Time: 12/25/22  6:25 AM   Specimen: BLOOD  Result Value Ref Range Status   Specimen Description BLOOD SITE NOT SPECIFIED  Final   Special Requests   Final    BOTTLES DRAWN AEROBIC AND ANAEROBIC Blood Culture adequate volume   Culture   Final    NO GROWTH 5 DAYS Performed at [facility], [address] [place], [place] [address]    Report Status 12/30/2022 FINAL  Final         Radiology Studies: VAS [place] LOWER EXTREMITY VENOUS (DVT)  Result Date: 12/29/2022  Lower Venous DVT Study Patient Name:  TEANNA CARLI  Date of Exam:   12/29/2022 Medical Rec #: [id number]                    Accession #:    [id number] Date of Birth: Mar 26, 1945

## 2022-12-30 NOTE — Plan of Care (Signed)

## 2022-12-31 ENCOUNTER — Inpatient Hospital Stay (HOSPITAL_COMMUNITY): Payer: Medicare HMO

## 2022-12-31 ENCOUNTER — Other Ambulatory Visit: Payer: Self-pay

## 2022-12-31 DIAGNOSIS — J9601 Acute respiratory failure with hypoxia: Secondary | ICD-10-CM | POA: Diagnosis not present

## 2022-12-31 LAB — COMPREHENSIVE METABOLIC PANEL
ALT: 42 U/L (ref 0–44)
AST: 15 U/L (ref 15–41)
Albumin: 2.5 g/dL — ABNORMAL LOW (ref 3.5–5.0)
Alkaline Phosphatase: 65 U/L (ref 38–126)
Anion gap: 3 — ABNORMAL LOW (ref 5–15)
BUN: 15 mg/dL (ref 8–23)
CO2: 23 mmol/L (ref 22–32)
Calcium: 9 mg/dL (ref 8.9–10.3)
Chloride: 111 mmol/L (ref 98–111)
Creatinine, Ser: 1.23 mg/dL — ABNORMAL HIGH (ref 0.44–1.00)
GFR, Estimated: 46 mL/min — ABNORMAL LOW (ref >60)
Glucose, Bld: 95 mg/dL (ref 70–99)
Potassium: 4.1 mmol/L (ref 3.5–5.1)
Sodium: 137 mmol/L (ref 135–145)
Total Bilirubin: 0.6 mg/dL (ref 0.3–1.2)
Total Protein: 5.3 g/dL — ABNORMAL LOW (ref 6.5–8.1)

## 2022-12-31 LAB — CBC WITH DIFFERENTIAL/PLATELET
Abs Immature Granulocytes: 0.08 10*3/uL — ABNORMAL HIGH (ref 0.00–0.07)
Basophils Absolute: 0 10*3/uL (ref 0.0–0.1)
Basophils Relative: 0 %
Eosinophils Absolute: 0.5 10*3/uL (ref 0.0–0.5)
Eosinophils Relative: 6 %
HCT: 37.2 % (ref 36.0–46.0)
Hemoglobin: 12.4 g/dL (ref 12.0–15.0)
Immature Granulocytes: 1 %
Lymphocytes Relative: 18 %
Lymphs Abs: 1.3 10*3/uL (ref 0.7–4.0)
MCH: 31.7 pg (ref 26.0–34.0)
MCHC: 33.3 g/dL (ref 30.0–36.0)
MCV: 95.1 fL (ref 80.0–100.0)
Monocytes Absolute: 0.7 10*3/uL (ref 0.1–1.0)
Monocytes Relative: 9 %
Neutro Abs: 5 10*3/uL (ref 1.7–7.7)
Neutrophils Relative %: 66 %
Platelets: 92 10*3/uL — ABNORMAL LOW (ref 150–400)
RBC: 3.91 MIL/uL (ref 3.87–5.11)
RDW: 13.6 % (ref 11.5–15.5)
WBC: 7.6 10*3/uL (ref 4.0–10.5)
nRBC: 0 % (ref 0.0–0.2)

## 2022-12-31 LAB — PHOSPHORUS: Phosphorus: 3.6 mg/dL (ref 2.5–4.6)

## 2022-12-31 LAB — MAGNESIUM: Magnesium: 1.9 mg/dL (ref 1.7–2.4)

## 2022-12-31 MED ORDER — OXYCODONE HCL 5 MG PO TABS
5.0000 mg | ORAL_TABLET | Freq: Four times a day (QID) | ORAL | Status: DC | PRN
  Administered 2022-12-31 – 2023-01-01 (×2): 5 mg via ORAL
  Filled 2022-12-31 (×4): qty 1

## 2022-12-31 MED ORDER — OXYCODONE HCL 5 MG PO TABS
2.5000 mg | ORAL_TABLET | ORAL | Status: DC | PRN
Start: 2022-12-31 — End: 2022-12-31

## 2022-12-31 MED ORDER — OXYCODONE HCL 5 MG PO TABS
2.5000 mg | ORAL_TABLET | Freq: Four times a day (QID) | ORAL | Status: DC | PRN
  Filled 2022-12-31: qty 1

## 2022-12-31 MED ORDER — OXYCODONE HCL 5 MG PO TABS: 5.0000 mg | ORAL_TABLET | ORAL | Status: DC | PRN

## 2022-12-31 MED ORDER — ACETAMINOPHEN 325 MG PO TABS
650.0000 mg | ORAL_TABLET | Freq: Four times a day (QID) | ORAL | Status: DC | PRN
  Administered 2023-01-08 – 2023-01-16 (×9): 650 mg via ORAL
  Filled 2022-12-31 (×10): qty 2

## 2022-12-31 MED ORDER — DICLOFENAC SODIUM 1 % EX GEL
2.0000 g | Freq: Four times a day (QID) | CUTANEOUS | Status: DC | PRN
  Administered 2023-01-01 – 2023-01-18 (×20): 2 g via TOPICAL
  Filled 2022-12-31 (×2): qty 100

## 2022-12-31 MED ORDER — ACETAMINOPHEN 500 MG PO TABS
1000.0000 mg | ORAL_TABLET | Freq: Three times a day (TID) | ORAL | Status: AC
  Administered 2022-12-31 – 2023-01-03 (×9): 1000 mg via ORAL
  Filled 2022-12-31 (×9): qty 2

## 2022-12-31 NOTE — Progress Notes (Signed)
Physical Therapy Treatment Patient Details Name: Danielle [name] MRN: [id number] DOB: Oct 26, 1945 Today's Date: 12/31/2022   History of Present Illness Pt is 77 year old presented to [facility] on  12/25/22 for acute respiratory failure with hypoxia and copd exacerbation. VQ scan confirms PE 10/28, age indeterminate B LE DVTs. PMH - anxiety, breast cancer, COPD, CHF, CAD, HTN, and CVA with residual RUE weakness, Lt hip fx with IM nail    PT Comments  Pt in bed upon arrival and agreeable to PT session. Pt had controlled descent by staff to ground earlier today and did not notice pain at that time. Upon moving LE towards EOB, pt noted significant pain in L LE. Pt was tender to light touch over L patella, quad musculature, and lower leg musculature. Pt was able to flex L knee minimally before having significant pain. Limited session to exercises in supine to wait until further assessment from MD. Acute PT to follow.      If plan is discharge home, recommend the following: A little help with walking and/or transfers;A little help with bathing/dressing/bathroom;Assistance with cooking/housework;Assist for transportation   Can travel by private vehicle     No  Equipment Recommendations  None recommended by PT       Precautions / Restrictions Precautions Precautions: Fall Precaution Comments: watch SpO2. HR and SOB Restrictions Weight Bearing Restrictions: No     Mobility  Bed Mobility  General bed mobility comments: deferred bed mobility due to significant pain in L LE after slow descend to ground this afternoon           Balance    Standing balance comment: did not assess this session     Cognition Arousal: Alert Behavior During Therapy: WFL for tasks assessed/performed Overall Cognitive Status: Within Functional Limits for tasks assessed       Exercises General Exercises - Upper Extremity Shoulder Flexion: AROM, Both, 10 reps, Supine (limited ROM) Elbow Flexion:  AROM, Both, 20 reps, Supine General Exercises - Lower Extremity Ankle Circles/Pumps: AROM, Both, 10 reps, Supine Heel Slides: AROM, Right, 5 reps, Supine Straight Leg Raises: AROM, Right, 5 reps, Supine    General Comments General comments (skin integrity, edema, etc.): VSS on RA, painful to light touch over L patella, quad musculature, and lower leg musculature. No pain with palpation or motion at L ankle or L hip. Pain w/ knee flexion      Pertinent Vitals/Pain Pain Assessment Pain Assessment: Faces Faces Pain Scale: Hurts even more Pain Location: L LE Pain Descriptors / Indicators: Discomfort, Grimacing, Guarding Pain Intervention(s): Limited activity within patient's tolerance, Monitored during session, Repositioned (notified RN and MD)     PT Goals (current goals can now be found in the care plan section) Acute Rehab PT Goals PT Goal Formulation: With patient Time For Goal Achievement: 01/09/23 Potential to Achieve Goals: Good Progress towards PT goals: Progressing toward goals    Frequency    Min 1X/week       AM-PAC PT "6 Clicks" Mobility   Outcome Measure  Help needed turning from your back to your side while in a flat bed without using bedrails?: A Lot Help needed moving from lying on your back to sitting on the side of a flat bed without using bedrails?: A Lot Help needed moving to and from a bed to a chair (including a wheelchair)?: A Lot Help needed standing up from a chair using your arms (e.g., wheelchair or bedside chair)?: A Lot Help needed to walk in hospital  room?: A Lot Help needed climbing 3-5 steps with a railing? : A Lot 6 Click Score: 12    End of Session   Activity Tolerance: Patient limited by pain Patient left: in bed;with call bell/phone within reach;with family/visitor present;with bed alarm set Nurse Communication: Mobility status (pain in L LE) PT Visit Diagnosis: Other abnormalities of gait and mobility (R26.89);Muscle weakness  (generalized) (M62.81);History of falling (Z91.81)     Time: 0102-7253 PT Time Calculation (min) (ACUTE ONLY): 24 min  Charges:    $Therapeutic Exercise: 8-22 mins PT General Charges $$ ACUTE PT VISIT: 1 Visit                    Hilton Cork, PT, DPT Secure Chat Preferred  Rehab Office [phone]    Arturo Morton Brion Aliment 12/31/2022, 5:35 PM

## 2022-12-31 NOTE — Progress Notes (Signed)
Mobility Specialist Progress Note:   12/31/22 1200  Mobility  Activity Ambulated with assistance in hallway  Level of Assistance Moderate assist, patient does 50-74%  Assistive Device Four wheel walker  Distance Ambulated (ft) 20 ft  Activity Response RN notified  Mobility Referral Yes  $Mobility charge 1 Mobility  Mobility Specialist Start Time (ACUTE ONLY) 1148  Mobility Specialist Stop Time (ACUTE ONLY) 1222  Mobility Specialist Time Calculation (min) (ACUTE ONLY) 34 min    Pt received in bed, agreeable to mobility. ModA to stand, w/ minA for ambulation. Pt made it into hallway just outside of door and took a seated break in rollator. Pt declined being rolled back into room and expressed confidence in ambulating back. Upon standing to turn forwards to rollator, pt's L knee gave out. Helped guide pt down with pt's back against wall. Nursing staff assisted in getting pt back up. Pt wheeled back to room and returned to bed. Pt left in bed w/ no complaints, just disappointed in fall. NT present and RN aware.  D'Vante Earlene Plater Mobility Specialist Please contact via Special educational needs teacher or Rehab office at [phone]

## 2022-12-31 NOTE — Plan of Care (Signed)
Patient fell during this shift, notified proper people and will continue to monitor patient

## 2022-12-31 NOTE — TOC Progression Note (Addendum)
Transition of Care [facility]) - Progression Note    Patient Details  Name: Danielle [name] MRN: [id number] Date of Birth: October 09, 1945  Transition of Care [facility]) CM/SW Contact  Delilah Shan, LCSWA Phone Number: 12/31/2022, 10:02 AM  Clinical Narrative:     Update-CSW received call from [facility] with [facility] who informed CSW she is unable to start insurance authorization for patient. Patients insurance informed Bjorn Loser patient is out of scope. CSW informed Patient. Patient unable to pay out of pocket for SNF. Patient is agreeable for CSW to request star program . Patient gave CSW pemission to try and find out more about her benefits. Patient thanked CSW.CSW informed MD, and requested if we could look at patient for the Star Program. CSW also reached out to [place] with [facility] leadership to see if she can help assist on looking more into patients insurance to verify additional information about patients benefits. CSW will continue to follow.  Patient has SNF bed at [facility]. CSW LVM for Rhonda in admissions with [facility]. CSW awaiting call back to check on status of patients insurance authorization for SNF. CSW will continue to follow.  Expected Discharge Plan: Skilled Nursing Facility Barriers to Discharge: Continued Medical Work up  Expected Discharge Plan and Services In-house Referral: Clinical Social Work Discharge Planning Services: CM Consult Post Acute Care Choice: Skilled Nursing Facility Living arrangements for the past 2 months: Single Family Home                           HH Arranged: PT, OT HH Agency: [facility] Date [facility] Agency Contacted: 12/27/22 Time HH Agency Contacted: 1423 Representative spoke with at [facility] Agency: Kandee Keen   Social Determinants of Health (SDOH) Interventions SDOH Screenings   Food Insecurity: No Food Insecurity (12/25/2022)  Housing: Low Risk  (12/25/2022)  Transportation Needs: No Transportation Needs (12/25/2022)   Utilities: Not At Risk (12/25/2022)  Alcohol Screen: Low Risk  (02/01/2022)  Depression (PHQ2-9): Low Risk  (06/04/2022)  Recent Concern: Depression (PHQ2-9) - Medium Risk (04/09/2022)  Financial Resource Strain: Low Risk  (02/01/2022)  Recent Concern: Financial Resource Strain - Medium Risk (01/25/2022)  Physical Activity: Inactive (02/01/2022)  Social Connections: Socially Integrated (02/01/2022)  Stress: No Stress Concern Present (02/01/2022)  Tobacco Use: Medium Risk (12/27/2022)    Readmission Risk Interventions     No data to display

## 2022-12-31 NOTE — Progress Notes (Signed)
PROGRESS NOTE    Danielle [name]  GBT:[id number] DOB: 04-23-1945 DOA: 12/25/2022 PCP: [place], [name], MD  Chief Complaint  Patient presents with   Shortness of Breath    Patient to ED via ems with complaint of sob lower 70% range on room air having difficulty speaking. Patient was found to be pale and diaphoretic. EMS reports patient was hypotensive with [name] BP 70/40. EMS reports giving patient 150 mL NS through [name] 22 gauge IV in left wrist.EMS reports placing [name] NRB on patient which improved O2 saturation into 90% range.    Brief Narrative:   Danielle [name] is Danielle [name] 77 y.o. female with Magnolia Mattila history of hypertension, hyperlipidemia, CAD, heart failure with reduced EF, paroxysmal atrial fibrillation, COPD, hyperthyroidism, thrombocytopenia, GERD, obesity. Patient presented secondary to shortness of breath with hypoxia and found to have evidence of sepsis with presumed pneumonia. Also concern for possible acute PE, started on Heparin IV. Blood cultures obtained and antibiotics started. V/Q scan obtained on 10/28 and confirms presence of Danielle [name] pulmonary embolism.   Assessment & Plan:   Principal Problem:   Acute respiratory failure with hypoxia (HCC) Active Problems:   SIRS (systemic inflammatory response syndrome) (HCC)   Elevated troponin   Acute kidney injury superimposed on chronic kidney disease (HCC)   Nausea, vomiting, and diarrhea   Essential hypertension   Paroxysmal atrial fibrillation (HCC)   Chronic HFrEF (heart failure with reduced ejection fraction) (HCC)   Hyperthyroidism   Obesity (BMI 30-39.9)   S/P aortic valve replacement with bioprosthetic valve   Acute pulmonary embolism (HCC)  Mechanical Fall  Left Knee Pain  Slid to ground (assisted to ground by mobility tech) - no significant trauma with fall, but she notes when getting up, she used her knee and had significant pain.  Notable TTP and pain with ROM, will get plain films.  Low threshold for CT  scan.  Severe sepsis Present on admission with presumed pneumonia. Blood cultures obtained. Antibiotics initiated for pneumonia. Blood cultures no growth x4 days. -Continue antibiotics -Follow-up blood cultures   Presumed community acquired pneumonia Patient with hypoxia and fever with x-ray evidence suggesting possible left lung base infection. Patient started empirically on Ceftriaxone and azithromycin. -Cefdinir and Azithromycin PO to complete course   COPD exacerbation Present on admission. Patient treated with steroids and breathing treatments. No wheezing noted. -Continue Duonebs, Brovana and budesonide   Acute respiratory failure with hypoxia Likely related to pulmonary embolism. Patient has required up to non-rebreather, now down to room air at rest. Patient continues to require up to 2 L/min with ambulation. - wean o2 as tolerated - currently on RA -Ambulatory pulse ox prior to discharge   Acute pulmonary embolism Bilateral LE DVT's Patient with elevated D-dimer measuring >20. V/Q scan confirms present of August Gosser pulmonary embolism. Associated demand ischemia that could indicate Danielle [name] level of heart strain. Initial Transthoracic Echocardiogram did not well visualize the RV. -Transition to Eliquis today -Follow-up limited Transthoracic Echocardiogram for RV evaluation -> appears moderately reduced in some views (will need repeat US within 3 months) - hemodynamically stable  -Check LE venous duplex (with bilateral age indeterminate DVT's involving bilateral popliteal, posterior tibial, and peroneal veins)   Elevated troponin Presumed demand ischemia. No chest pain. Transthoracic Echocardiogram ordered and shows slightly more reduced LVEF of 40-45%.  Likely secondary to pulmonary embolism. Needs cardiology follow up    AKI on CKD stage IIIb Baseline creatinine of about 1.6. Creatinine of 3.13 on admission with peak of 3.40. Creatinine improved  to 1.2. AKI resolved.    Nausea/vomiting/diarrhea CT abdomen/pelvis without evidence of acute inflammation. resolved   Primary hypertension Noted. Patient is on spironolactone and Coreg as an outpatient.   Paroxysmal atrial fibrillation Noted. Patient is on Coreg and Eliquis as an outpatient.   Chronic heart failure with reduced ejection fracture Last LVEF from 2023 significant for an LVEF of 50% with grade 1 diastolic dysfunction. Patient is on Coreg and spironolactone Transthoracic Echocardiogram ordered this admission (difficult study) and is significant for newly reduced LVEF of 40-45%. Patient will need to follow-up with cardiology as an outpatient. -Continue home Coreg -Continue losartan   Hyperthyroidism TSH and free T4 within normal limits.   Thrombocytopenia Chronic. Stable. Follow outpatient    Aortic atherosclerosis Noted on imaging.   History of bioprosthetic aortic valve Noted. From 2016.    Obesity Body mass index is 38.05 kg/m.    DVT prophylaxis: eliquis Code Status: DNR Family Communication: none Disposition:   Status is: Inpatient Remains inpatient appropriate because: pending placement   Consultants:  none  Procedures:  LE [place] Summary:  BILATERAL:  -No evidence of popliteal cyst, bilaterally.  RIGHT:   - Findings consistent with age indeterminate deep vein thrombosis  involving the right popliteal vein, right posterior tibial veins, and  right peroneal veins.   Echo 10/29  IMPRESSIONS     1. Very poor acoustical windows with very poor endocardial border  definition even with definity contrast. Left ventricular ejection fraction  appears at least mildly reduced.   2. Right ventricular systolic function appears moderately reduced in some  views. The right ventricular size is mildly dilated.   3. The mitral valve is normal in structure.   4. The aortic valve was not well visualized. Aortic valve regurgitation  is not visualized. There is Danielle [name] 23 mm St. Jude  Trifecta valve present in the  aortic position.   Echo 10/28 IMPRESSIONS     1. LVEF difficult to quantify even with definity, likely at least mildly  reduced. Left ventricular ejection fraction, by estimation, is 40 to 45%.  The left ventricle has mildly decreased function. Left ventricular  endocardial border not optimally  defined to evaluate regional wall motion. Indeterminate diastolic filling  due to E-Caedyn Tassinari fusion.   2. Right ventricular systolic function was not well visualized. The right  ventricular size is not well visualized. Tricuspid regurgitation signal is  inadequate for assessing PA pressure.   3. The mitral valve is grossly normal. No evidence of mitral valve  regurgitation.   4. The aortic valve has been repaired/replaced. Aortic valve  regurgitation is not visualized. There is Meckenzie Balsley 23 mm St. Jude Trifecta valve  present in the aortic position. Procedure Date: 03/12/14. Echo findings are  consistent with normal structure and  function of the aortic valve prosthesis. Aortic valve area, by VTI  measures 1.08 cm. Aortic valve mean gradient measures 12.5 mmHg. Aortic  valve Vmax measures 2.38 m/s.   5. The inferior vena cava is dilated in size with <50% respiratory  variability, suggesting right atrial pressure of 15 mmHg.   Antimicrobials:  Anti-infectives (From admission, onward)    Start     Dose/Rate Route Frequency Ordered Stop   12/28/22 1000  cefdinir (OMNICEF) capsule 300 mg        300 mg Oral Every 12 hours 12/27/22 1543 12/29/22 0826   12/28/22 1000  azithromycin (ZITHROMAX) tablet 500 mg        500 mg Oral Daily 12/27/22 1543 12/29/22  0825   12/26/22 1000  cefTRIAXone (ROCEPHIN) 2 g in sodium chloride 0.9 % 100 mL IVPB  Status:  Discontinued        2 g 200 mL/hr over 30 Minutes Intravenous Every 24 hours 12/25/22 1024 12/27/22 1543   12/26/22 1000  azithromycin (ZITHROMAX) 500 mg in sodium chloride 0.9 % 250 mL IVPB  Status:  Discontinued        500 mg 250  mL/hr over 60 Minutes Intravenous Every 24 hours 12/25/22 1024 12/27/22 1543   12/25/22 0600  vancomycin (VANCOREADY) IVPB 2000 mg/400 mL        2,000 mg 200 mL/hr over 120 Minutes Intravenous  Once 12/25/22 0545 12/25/22 0922   12/25/22 0545  cefTRIAXone (ROCEPHIN) 1 g in sodium chloride 0.9 % 100 mL IVPB        1 g 200 mL/hr over 30 Minutes Intravenous  Once 12/25/22 0538 12/25/22 0733   12/25/22 0545  azithromycin (ZITHROMAX) 500 mg in sodium chloride 0.9 % 250 mL IVPB        500 mg 250 mL/hr over 60 Minutes Intravenous  Once 12/25/22 0538 12/25/22 1127       Subjective: C/o knee pain Had fall earlier today - slid down wall, guided to ground by mobiity tech, but hurt her knee as she was getting up  Objective: Vitals:   12/31/22 0353 12/31/22 0400 12/31/22 1027 12/31/22 1437  BP: 128/79   115/79  Pulse: 81  98 86  Resp:  20  (!) 21  Temp: 98.5 F (36.9 C)   98.5 F (36.9 C)  TempSrc: Oral   Oral  SpO2: 94% 93% 94%   Weight: 110.2 kg     Height:        Intake/Output Summary (Last 24 hours) at 12/31/2022 1726 Last data filed at 12/31/2022 1410 Gross per 24 hour  Intake 120 ml  Output 1150 ml  Net -1030 ml   Filed Weights   12/29/22 0325 12/30/22 0512 12/31/22 0353  Weight: 112.6 kg 111.6 kg 110.2 kg    Examination:  General: No acute distress. Cardiovascular: RRR Lungs: unlabored Neurological: Alert and oriented 3. Moves all extremities 4 with equal strength. Cranial nerves II through XII grossly intact. Extremities: notes TTP to L knee and issues with bending it (pain with ROM, active and passive) - possible effusion, difficult with body habitus    Data Reviewed: I have personally reviewed following labs and imaging studies  CBC: Recent Labs  Lab 12/25/22 0447 12/25/22 0454 12/26/22 0420 12/27/22 0639 12/28/22 0521 12/30/22 0545 12/31/22 0633  WBC 9.1  --  14.9* 12.6* 9.8 7.5 7.6  NEUTROABS 7.3  --   --   --   --  4.9 5.0  HGB 14.4   < > 12.8  11.0* 10.9* 12.5 12.4  HCT 44.1   < > 38.5 33.7* 33.3* 37.0 37.2  MCV 93.8  --  93.0 95.5 96.0 94.1 95.1  PLT 68*  --  66* 75* 72* 82* 92*   < > = values in this interval not displayed.    Basic Metabolic Panel: Recent Labs  Lab 12/25/22 0447 12/25/22 0454 12/26/22 0420 12/27/22 0639 12/27/22 2345 12/30/22 0545 12/31/22 0633  NA 138 139 136 139  --  136 137  K 4.4 4.4 3.9 3.9 4.4 4.0 4.1  CL 103 104 106 113*  --  109 111  CO2 20*  --  21* 20*  --  18* 23  GLUCOSE 140* 141* 151*  89  --  82 95  BUN 31* 33* 36* 25*  --  17 15  CREATININE 3.13* 3.40* 2.28* 1.54*  --  1.30* 1.23*  CALCIUM 10.2  --  9.4 8.5*  --  9.0 9.0  MG  --   --   --   --  2.0 1.8 1.9  PHOS  --   --   --   --   --  3.4 3.6    GFR: Estimated Creatinine Clearance: 49.8 mL/min (Veralyn Lopp) (by C-G formula based on SCr of 1.23 mg/dL (H)).  Liver Function Tests: Recent Labs  Lab 12/25/22 0447 12/30/22 0545 12/31/22 0633  AST 17 20 15   ALT 13 56* 42  ALKPHOS 79 63 65  BILITOT 0.6 0.8 0.6  PROT 7.2 5.4* 5.3*  ALBUMIN 3.6 2.5* 2.5*    CBG: No results for input(s): "GLUCAP" in the last 168 hours.   Recent Results (from the past 240 hour(s))  Resp panel by RT-PCR (RSV, Flu Lynnlee Revels&B, Covid) Anterior Nasal Swab     Status: None   Collection Time: 12/25/22  4:47 AM   Specimen: Anterior Nasal Swab  Result Value Ref Range Status   SARS Coronavirus 2 by RT PCR NEGATIVE NEGATIVE Final   Influenza Zabdiel Dripps by PCR NEGATIVE NEGATIVE Final   Influenza B by PCR NEGATIVE NEGATIVE Final    Comment: (NOTE) The Xpert Xpress SARS-CoV-2/FLU/RSV plus assay is intended as an aid in the diagnosis of influenza from Nasopharyngeal swab specimens and should not be used as Braedan Meuth sole basis for treatment. Nasal washings and aspirates are unacceptable for Xpert Xpress SARS-CoV-2/FLU/RSV testing.  Fact Sheet for Patients: BloggerCourse.com  Fact Sheet for Healthcare  Providers: SeriousBroker.it  This test is not yet approved or cleared by the [place] FDA and has been authorized for detection and/or diagnosis of SARS-CoV-2 by FDA under an Emergency Use Authorization (EUA). This EUA will remain in effect (meaning this test can be used) for the duration of the COVID-19 declaration under Section 564(b)(1) of the Act, 21 U.S.C. section 360bbb-3(b)(1), unless the authorization is terminated or revoked.     Resp Syncytial Virus by PCR NEGATIVE NEGATIVE Final    Comment: (NOTE) Fact Sheet for Patients: BloggerCourse.com  Fact Sheet for Healthcare Providers: SeriousBroker.it  This test is not yet approved or cleared by the [place] FDA and has been authorized for detection and/or diagnosis of SARS-CoV-2 by FDA under an Emergency Use Authorization (EUA). This EUA will remain in effect (meaning this test can be used) for the duration of the COVID-19 declaration under Section 564(b)(1) of the Act, 21 U.S.C. section 360bbb-3(b)(1), unless the authorization is terminated or revoked.  Performed at [facility], [address], [place], [place] [address]   Blood culture (routine x 2)     Status: None   Collection Time: 12/25/22  6:12 AM   Specimen: BLOOD RIGHT HAND  Result Value Ref Range Status   Specimen Description BLOOD RIGHT HAND  Final   Special Requests   Final    BOTTLES DRAWN AEROBIC ONLY Blood Culture adequate volume   Culture   Final    NO GROWTH 5 DAYS Performed at [facility], [address], [place], [place] [address]    Report Status 12/30/2022 FINAL  Final  Blood culture (routine x 2)     Status: None   Collection Time: 12/25/22  6:25 AM   Specimen: BLOOD  Result Value Ref Range Status   Specimen Description  BLOOD SITE NOT SPECIFIED  Final   Special Requests   Final    BOTTLES DRAWN AEROBIC AND ANAEROBIC Blood Culture adequate volume    Culture   Final    NO GROWTH 5 DAYS Performed at [facility], [address] [place], [place] [address]    Report Status 12/30/2022 FINAL  Final         Radiology Studies: No results found.      Scheduled Meds:  acetaminophen  1,000 mg Oral Q8H   apixaban  10 mg Oral BID   Followed by   Melene Muller ON 01/04/2023] apixaban  5 mg Oral BID   arformoterol  15 mcg Nebulization BID   budesonide (PULMICORT) nebulizer solution  0.5 mg Nebulization BID   carvedilol  25 mg Oral BID WC   lidocaine  2 patch Transdermal Q24H   losartan  25 mg Oral Daily   polyethylene glycol  17 g Oral Daily   sodium chloride flush  3 mL Intravenous Q12H   Continuous Infusions:   LOS: 6 days    Time spent: over 30 min    Lacretia Nicks, MD [facility]   To contact the attending provider between 7A-7P or the covering provider during after hours 7P-7A, please log into the web site [email or web address] and access using universal [place] password for that web site. If you do not have the password, please call the hospital operator.  12/31/2022, 5:26 PM

## 2022-12-31 NOTE — Progress Notes (Addendum)
Patient healthcare giver from home is at bedside. The patient wanted me to update her on her condition.   Gean Maidens CNA

## 2023-01-01 DIAGNOSIS — J9601 Acute respiratory failure with hypoxia: Secondary | ICD-10-CM | POA: Diagnosis not present

## 2023-01-01 MED ORDER — DICLOFENAC SODIUM 1 % EX GEL
2.0000 g | Freq: Four times a day (QID) | CUTANEOUS | Status: DC
  Filled 2023-01-01: qty 100

## 2023-01-01 MED ORDER — OXYCODONE HCL 5 MG PO TABS
5.0000 mg | ORAL_TABLET | Freq: Four times a day (QID) | ORAL | Status: DC | PRN
  Administered 2023-01-01 – 2023-01-08 (×12): 5 mg via ORAL
  Filled 2023-01-01 (×13): qty 1

## 2023-01-01 MED ORDER — OXYCODONE HCL 5 MG PO TABS
10.0000 mg | ORAL_TABLET | Freq: Four times a day (QID) | ORAL | Status: DC | PRN
  Administered 2023-01-01: 10 mg via ORAL
  Filled 2023-01-01: qty 2

## 2023-01-01 NOTE — Progress Notes (Signed)
PROGRESS NOTE    [name]  HQI:[id number] DOB: [birth year] DOA: 12/25/2022 PCP: [place], [name], MD  Chief Complaint  Patient presents with   Shortness of Breath    Patient to ED via ems with complaint of sob lower 70% range on room air having difficulty speaking. Patient was found to be pale and diaphoretic. EMS reports patient was hypotensive with Danielle [name] BP 70/40. EMS reports giving patient 150 mL NS through [place] 22 gauge IV in left wrist.EMS reports placing Danielle [name] NRB on patient which improved O2 saturation into 90% range.    Brief Narrative:   Danielle [name] is Danielle [name] 77 y.o. female with Essance Gatti history of hypertension, hyperlipidemia, CAD, heart failure with reduced EF, paroxysmal atrial fibrillation, COPD, hyperthyroidism, thrombocytopenia, GERD, obesity. Patient presented secondary to shortness of breath with hypoxia and found to have evidence of sepsis with presumed pneumonia. Also concern for possible acute PE, started on Heparin IV. Blood cultures obtained and antibiotics started. V/Q scan obtained on 10/28 and confirms presence of Danielle [name] pulmonary embolism.   Assessment & Plan:   Principal Problem:   Acute respiratory failure with hypoxia (HCC) Active Problems:   SIRS (systemic inflammatory response syndrome) (HCC)   Elevated troponin   Acute kidney injury superimposed on chronic kidney disease (HCC)   Nausea, vomiting, and diarrhea   Essential hypertension   Paroxysmal atrial fibrillation (HCC)   Chronic HFrEF (heart failure with reduced ejection fraction) (HCC)   Hyperthyroidism   Obesity (BMI 30-39.9)   S/P aortic valve replacement with bioprosthetic valve   Acute pulmonary embolism (HCC)  Mechanical Fall  Left Knee Pain Osteoarthritis  Slid to ground (assisted to ground by mobility tech) - no significant trauma with fall, but she notes when getting up, she used her knee and had significant pain.  Notable TTP and pain with ROM, will get plain films.   X ray with  severe tricompartmental degenerative changes  Severe sepsis Present on admission with presumed pneumonia. Blood cultures obtained. Antibiotics initiated for pneumonia. Blood cultures no growth x4 days. -Continue antibiotics -Follow-up blood cultures   Presumed community acquired pneumonia Patient with hypoxia and fever with x-ray evidence suggesting possible left lung base infection. Patient started empirically on Ceftriaxone and azithromycin. -completed abx with cefdinir and azithro    COPD exacerbation Present on admission. Patient treated with steroids and breathing treatments. No wheezing noted. -Continue Duonebs, Brovana and budesonide   Acute respiratory failure with hypoxia Likely related to pulmonary embolism. Patient has required up to non-rebreather, now down to room air at rest. Patient continues to require up to 2 L/min with ambulation. - wean o2 as tolerated - currently on RA -Ambulatory pulse ox prior to discharge   Acute pulmonary embolism Bilateral LE DVT's Patient with elevated D-dimer measuring >20. V/Q scan confirms present of Caylon Saine pulmonary embolism. Associated demand ischemia that could indicate Renette Hsu level of heart strain. Initial Transthoracic Echocardiogram did not well visualize the RV. -Transition to Eliquis today -Follow-up limited Transthoracic Echocardiogram for RV evaluation -> appears moderately reduced in some views (will need repeat US within 3 months) - hemodynamically stable  -Check LE venous duplex (with bilateral age indeterminate DVT's involving bilateral popliteal, posterior tibial, and peroneal veins)   Elevated troponin Presumed demand ischemia. No chest pain. Transthoracic Echocardiogram ordered and shows slightly more reduced LVEF of 40-45%.  Likely secondary to pulmonary embolism. Needs cardiology follow up    AKI on CKD stage IIIb Baseline creatinine of about 1.6. Creatinine of 3.13 on admission with peak  of 3.40. Creatinine improved to 1.2. AKI  resolved.   Nausea/vomiting/diarrhea CT abdomen/pelvis without evidence of acute inflammation. resolved   Primary hypertension Noted. Patient is on spironolactone and Coreg as an outpatient.   Paroxysmal atrial fibrillation Noted. Patient is on Coreg and Eliquis as an outpatient.   Chronic heart failure with reduced ejection fracture Last LVEF from 2023 significant for an LVEF of 50% with grade 1 diastolic dysfunction. Patient is on Coreg and spironolactone Transthoracic Echocardiogram ordered this admission (difficult study) and is significant for newly reduced LVEF of 40-45%. Patient will need to follow-up with cardiology as an outpatient. -Continue home Coreg -Continue losartan   Hyperthyroidism TSH and free T4 within normal limits.   Thrombocytopenia Chronic. Stable. Follow outpatient    Aortic atherosclerosis Noted on imaging.   History of bioprosthetic aortic valve Noted. From 2016.    Obesity Body mass index is 37.81 kg/m.    DVT prophylaxis: eliquis Code Status: DNR Family Communication: none Disposition:   Status is: Inpatient Remains inpatient appropriate because: pending placement   Consultants:  none  Procedures:  LE [place] Summary:  BILATERAL:  -No evidence of popliteal cyst, bilaterally.  RIGHT:   - Findings consistent with age indeterminate deep vein thrombosis  involving the right popliteal vein, right posterior tibial veins, and  right peroneal veins.   Echo 10/29  IMPRESSIONS     1. Very poor acoustical windows with very poor endocardial border  definition even with definity contrast. Left ventricular ejection fraction  appears at least mildly reduced.   2. Right ventricular systolic function appears moderately reduced in some  views. The right ventricular size is mildly dilated.   3. The mitral valve is normal in structure.   4. The aortic valve was not well visualized. Aortic valve regurgitation  is not visualized. There is Danielle [name] 23 mm  St. Jude Trifecta valve present in the  aortic position.   Echo 10/28 IMPRESSIONS     1. LVEF difficult to quantify even with definity, likely at least mildly  reduced. Left ventricular ejection fraction, by estimation, is 40 to 45%.  The left ventricle has mildly decreased function. Left ventricular  endocardial border not optimally  defined to evaluate regional wall motion. Indeterminate diastolic filling  due to E-Laythan Hayter fusion.   2. Right ventricular systolic function was not well visualized. The right  ventricular size is not well visualized. Tricuspid regurgitation signal is  inadequate for assessing PA pressure.   3. The mitral valve is grossly normal. No evidence of mitral valve  regurgitation.   4. The aortic valve has been repaired/replaced. Aortic valve  regurgitation is not visualized. There is Makinzee Durley 23 mm St. Jude Trifecta valve  present in the aortic position. Procedure Date: 03/12/14. Echo findings are  consistent with normal structure and  function of the aortic valve prosthesis. Aortic valve area, by VTI  measures 1.08 cm. Aortic valve mean gradient measures 12.5 mmHg. Aortic  valve Vmax measures 2.38 m/s.   5. The inferior vena cava is dilated in size with <50% respiratory  variability, suggesting right atrial pressure of 15 mmHg.   Antimicrobials:  Anti-infectives (From admission, onward)    Start     Dose/Rate Route Frequency Ordered Stop   12/28/22 1000  cefdinir (OMNICEF) capsule 300 mg        300 mg Oral Every 12 hours 12/27/22 1543 12/29/22 0826   12/28/22 1000  azithromycin (ZITHROMAX) tablet 500 mg        500 mg Oral  Daily 12/27/22 1543 12/29/22 0825   12/26/22 1000  cefTRIAXone (ROCEPHIN) 2 g in sodium chloride 0.9 % 100 mL IVPB  Status:  Discontinued        2 g 200 mL/hr over 30 Minutes Intravenous Every 24 hours 12/25/22 1024 12/27/22 1543   12/26/22 1000  azithromycin (ZITHROMAX) 500 mg in sodium chloride 0.9 % 250 mL IVPB  Status:  Discontinued        500  mg 250 mL/hr over 60 Minutes Intravenous Every 24 hours 12/25/22 1024 12/27/22 1543   12/25/22 0600  vancomycin (VANCOREADY) IVPB 2000 mg/400 mL        2,000 mg 200 mL/hr over 120 Minutes Intravenous  Once 12/25/22 0545 12/25/22 0922   12/25/22 0545  cefTRIAXone (ROCEPHIN) 1 g in sodium chloride 0.9 % 100 mL IVPB        1 g 200 mL/hr over 30 Minutes Intravenous  Once 12/25/22 0538 12/25/22 0733   12/25/22 0545  azithromycin (ZITHROMAX) 500 mg in sodium chloride 0.9 % 250 mL IVPB        500 mg 250 mL/hr over 60 Minutes Intravenous  Once 12/25/22 0538 12/25/22 1127       Subjective: C/o continued knee pain, she notes this is chronic  Objective: Vitals:   01/01/23 0605 01/01/23 0631 01/01/23 0816 01/01/23 1238  BP:  113/74    Pulse:  80 81   Resp: 20 19 19 20   Temp:  98.5 F (36.9 C)  98.3 F (36.8 C)  TempSrc:  Oral  Oral  SpO2:   95% 95%  Weight: 109.5 kg     Height:        Intake/Output Summary (Last 24 hours) at 01/01/2023 1525 Last data filed at 01/01/2023 1419 Gross per 24 hour  Intake 490 ml  Output 1500 ml  Net -1010 ml   Filed Weights   12/30/22 0512 12/31/22 0353 01/01/23 0605  Weight: 111.6 kg 110.2 kg 109.5 kg    Examination:  General: No acute distress. Cardiovascular: RRR Lungs: unlabored Neurological: Alert and oriented 3. Moves all extremities 4 with equal strength. Cranial nerves II through XII grossly intact. Extremities: continued TTP to L knee    Data Reviewed: I have personally reviewed following labs and imaging studies  CBC: Recent Labs  Lab 12/26/22 0420 12/27/22 0639 12/28/22 0521 12/30/22 0545 12/31/22 0633  WBC 14.9* 12.6* 9.8 7.5 7.6  NEUTROABS  --   --   --  4.9 5.0  HGB 12.8 11.0* 10.9* 12.5 12.4  HCT 38.5 33.7* 33.3* 37.0 37.2  MCV 93.0 95.5 96.0 94.1 95.1  PLT 66* 75* 72* 82* 92*    Basic Metabolic Panel: Recent Labs  Lab 12/26/22 0420 12/27/22 0639 12/27/22 2345 12/30/22 0545 12/31/22 0633  NA 136 139  --   136 137  K 3.9 3.9 4.4 4.0 4.1  CL 106 113*  --  109 111  CO2 21* 20*  --  18* 23  GLUCOSE 151* 89  --  82 95  BUN 36* 25*  --  17 15  CREATININE 2.28* 1.54*  --  1.30* 1.23*  CALCIUM 9.4 8.5*  --  9.0 9.0  MG  --   --  2.0 1.8 1.9  PHOS  --   --   --  3.4 3.6    GFR: Estimated Creatinine Clearance: 49.6 mL/min (Maddix Kliewer) (by C-G formula based on SCr of 1.23 mg/dL (H)).  Liver Function Tests: Recent Labs  Lab 12/30/22 0545 12/31/22 [phone]  AST 20 15  ALT 56* 42  ALKPHOS 63 65  BILITOT 0.8 0.6  PROT 5.4* 5.3*  ALBUMIN 2.5* 2.5*    CBG: No results for input(s): "GLUCAP" in the last 168 hours.   Recent Results (from the past 240 hour(s))  Resp panel by RT-PCR (RSV, Flu [facility] Kohan&B, Covid) Anterior Nasal Swab     Status: None   Collection Time: 12/25/22  4:47 AM   Specimen: Anterior Nasal Swab  Result Value Ref Range Status   SARS Coronavirus 2 by RT PCR NEGATIVE NEGATIVE Final   Influenza Ivannia Willhelm by PCR NEGATIVE NEGATIVE Final   Influenza B by PCR NEGATIVE NEGATIVE Final    Comment: (NOTE) The Xpert Xpress SARS-CoV-2/FLU/RSV plus assay is intended as an aid in the diagnosis of influenza from Nasopharyngeal swab specimens and should not be used as Benuel Ly sole basis for treatment. Nasal washings and aspirates are unacceptable for Xpert Xpress SARS-CoV-2/FLU/RSV testing.  Fact Sheet for Patients: BloggerCourse.com  Fact Sheet for Healthcare Providers: SeriousBroker.it  This test is not yet approved or cleared by the [place] FDA and has been authorized for detection and/or diagnosis of SARS-CoV-2 by FDA under an Emergency Use Authorization (EUA). This EUA will remain in effect (meaning this test can be used) for the duration of the COVID-19 declaration under Section 564(b)(1) of the Act, 21 U.S.C. section 360bbb-3(b)(1), unless the authorization is terminated or revoked.     Resp Syncytial Virus by PCR NEGATIVE NEGATIVE Final     Comment: (NOTE) Fact Sheet for Patients: BloggerCourse.com  Fact Sheet for Healthcare Providers: SeriousBroker.it  This test is not yet approved or cleared by the [place] FDA and has been authorized for detection and/or diagnosis of SARS-CoV-2 by FDA under an Emergency Use Authorization (EUA). This EUA will remain in effect (meaning this test can be used) for the duration of the COVID-19 declaration under Section 564(b)(1) of the Act, 21 U.S.C. section 360bbb-3(b)(1), unless the authorization is terminated or revoked.  Performed at [facility], [address], [place], [place] [address]   Blood culture (routine x 2)     Status: None   Collection Time: 12/25/22  6:12 AM   Specimen: BLOOD RIGHT HAND  Result Value Ref Range Status   Specimen Description BLOOD RIGHT HAND  Final   Special Requests   Final    BOTTLES DRAWN AEROBIC ONLY Blood Culture adequate volume   Culture   Final    NO GROWTH 5 DAYS Performed at [facility], [address], [place], [place] [address]    Report Status 12/30/2022 FINAL  Final  Blood culture (routine x 2)     Status: None   Collection Time: 12/25/22  6:25 AM   Specimen: BLOOD  Result Value Ref Range Status   Specimen Description BLOOD SITE NOT SPECIFIED  Final   Special Requests   Final    BOTTLES DRAWN AEROBIC AND ANAEROBIC Blood Culture adequate volume   Culture   Final    NO GROWTH 5 DAYS Performed at [facility], [address], [place], [place] [address]    Report Status 12/30/2022 FINAL  Final         Radiology Studies: DG Knee 1-2 Views Left  Result Date: 12/31/2022 CLINICAL DATA:  Pain EXAM: LEFT KNEE - 1-2 VIEW COMPARISON:  None. FINDINGS: No fracture or dislocation is seen. Severe tricompartmental degenerative changes, most prominent in the medial compartment. IM nail in the distal femur, incompletely visualized. Small  suprapatellar knee joint effusion.  IMPRESSION: No fracture or dislocation is seen. Severe tricompartmental degenerative changes, as above. Electronically Signed   By: Charline Bills M.D.   On: 12/31/2022 21:53        Scheduled Meds:  acetaminophen  1,000 mg Oral Q8H   apixaban  10 mg Oral BID   Followed by   Melene Muller ON 01/04/2023] apixaban  5 mg Oral BID   arformoterol  15 mcg Nebulization BID   budesonide (PULMICORT) nebulizer solution  0.5 mg Nebulization BID   carvedilol  25 mg Oral BID WC   lidocaine  2 patch Transdermal Q24H   losartan  25 mg Oral Daily   polyethylene glycol  17 g Oral Daily   sodium chloride flush  3 mL Intravenous Q12H   Continuous Infusions:   LOS: 7 days    Time spent: over 30 min    Lacretia Nicks, MD [facility]   To contact the attending provider between 7A-7P or the covering provider during after hours 7P-7A, please log into the web site [email or web address] and access using universal [place] password for that web site. If you do not have the password, please call the hospital operator.  01/01/2023, 3:25 PM

## 2023-01-01 NOTE — TOC Progression Note (Signed)
Transition of Care [facility]) - Progression Note    Patient Details  Name: Danielle [name] MRN: [id number] Date of Birth: [birth year]  Transition of Care [facility]) CM/SW Contact  Donnalee Curry, LCSWA Phone Number: 01/01/2023, 4:06 PM  Clinical Narrative:     Per MD request, SW followed up with Bjorn Loser Joetta Manners [phone]) who reports she attempted to call pt's insurance today but was not able to reach anyone. Bjorn Loser reports pt's insurance is not managed by [employer] and when SNF initially attempted to start auth they were told they did not have the correct info then they were told pt was out of scope. Rhonda plans to attempt to call again on Monday to determine what is needed to start auth.   Per PT, pt is pending approval for STAR program, will f/u with weekday TOC.   Expected Discharge Plan: Skilled Nursing Facility Barriers to Discharge: Continued Medical Work up  Expected Discharge Plan and Services In-house Referral: Clinical Social Work Discharge Planning Services: CM Consult Post Acute Care Choice: Skilled Nursing Facility Living arrangements for the past 2 months: Single Family Home                           HH Arranged: PT, OT HH Agency: [facility] Date [facility] Agency Contacted: 12/27/22 Time HH Agency Contacted: 1423 Representative spoke with at [facility] Agency: Kandee Keen   Social Determinants of Health (SDOH) Interventions SDOH Screenings   Food Insecurity: No Food Insecurity (12/25/2022)  Housing: Low Risk  (12/25/2022)  Transportation Needs: No Transportation Needs (12/25/2022)  Utilities: Not At Risk (12/25/2022)  Alcohol Screen: Low Risk  (02/01/2022)  Depression (PHQ2-9): Low Risk  (06/04/2022)  Recent Concern: Depression (PHQ2-9) - Medium Risk (04/09/2022)  Financial Resource Strain: Low Risk  (02/01/2022)  Recent Concern: Financial Resource Strain - Medium Risk (01/25/2022)  Physical Activity: Inactive (02/01/2022)  Social Connections: Socially  Integrated (02/01/2022)  Stress: No Stress Concern Present (02/01/2022)  Tobacco Use: Medium Risk (12/27/2022)    Readmission Risk Interventions     No data to display

## 2023-01-02 DIAGNOSIS — J9601 Acute respiratory failure with hypoxia: Secondary | ICD-10-CM | POA: Diagnosis not present

## 2023-01-02 LAB — BASIC METABOLIC PANEL
Anion gap: 11 (ref 5–15)
BUN: 21 mg/dL (ref 8–23)
CO2: 23 mmol/L (ref 22–32)
Calcium: 9.4 mg/dL (ref 8.9–10.3)
Chloride: 102 mmol/L (ref 98–111)
Creatinine, Ser: 1.59 mg/dL — ABNORMAL HIGH (ref 0.44–1.00)
GFR, Estimated: 33 mL/min — ABNORMAL LOW (ref >60)
Glucose, Bld: 90 mg/dL (ref 70–99)
Potassium: 4.3 mmol/L (ref 3.5–5.1)
Sodium: 136 mmol/L (ref 135–145)

## 2023-01-02 LAB — MAGNESIUM: Magnesium: 2.1 mg/dL (ref 1.7–2.4)

## 2023-01-02 NOTE — Progress Notes (Signed)
PROGRESS NOTE    Danielle [name]  ZOX:[id number] DOB: 04-24-1945 DOA: 12/25/2022 PCP: [place], [name], MD  Chief Complaint  Patient presents with   Shortness of Breath    Patient to ED via ems with complaint of sob lower 70% range on room air having difficulty speaking. Patient was found to be pale and diaphoretic. EMS reports patient was hypotensive with Danielle [name] BP 70/40. EMS reports giving patient 150 mL NS through Danielle [name] 22 gauge IV in left wrist.EMS reports placing Danielle [name] NRB on patient which improved O2 saturation into 90% range.    Brief Narrative:   Danielle [name] is Danielle [name] 77 y.o. female with Danielle [name] history of hypertension, hyperlipidemia, CAD, heart failure with reduced EF, paroxysmal atrial fibrillation, COPD, hyperthyroidism, thrombocytopenia, GERD, obesity. Patient presented secondary to shortness of breath with hypoxia and found to have evidence of sepsis with presumed pneumonia. Also concern for possible acute PE, started on Heparin IV. Blood cultures obtained and antibiotics started. V/Q scan obtained on 10/28 and confirms presence of Danielle [name] pulmonary embolism.   Assessment & Plan:   Principal Problem:   Acute respiratory failure with hypoxia (HCC) Active Problems:   SIRS (systemic inflammatory response syndrome) (HCC)   Elevated troponin   Acute kidney injury superimposed on chronic kidney disease (HCC)   Nausea, vomiting, and diarrhea   Essential hypertension   Paroxysmal atrial fibrillation (HCC)   Chronic HFrEF (heart failure with reduced ejection fraction) (HCC)   Hyperthyroidism   Obesity (BMI 30-39.9)   S/P aortic valve replacement with bioprosthetic valve   Acute pulmonary embolism (HCC)  Mechanical Fall  Left Knee Pain Osteoarthritis  Slid to ground (assisted to ground by mobility tech) - no significant trauma with fall, but she notes when getting up, she used her knee and had significant pain.  Notable TTP and pain with ROM, will get plain films.   X ray with  severe tricompartmental degenerative changes improved  Severe sepsis Present on admission with presumed pneumonia. Blood cultures obtained. Antibiotics initiated for pneumonia. Blood cultures no growth x4 days. -Continue antibiotics -Follow-up blood cultures   Presumed community acquired pneumonia Patient with hypoxia and fever with x-ray evidence suggesting possible left lung base infection. Patient started empirically on Ceftriaxone and azithromycin. -completed abx with cefdinir and azithro    COPD exacerbation Present on admission. Patient treated with steroids and breathing treatments. No wheezing noted. -Continue Duonebs, Brovana and budesonide   Acute respiratory failure with hypoxia Likely related to pulmonary embolism. Patient has required up to non-rebreather, now down to room air at rest. Patient continues to require up to 2 L/min with ambulation. - wean o2 as tolerated - currently on RA -Ambulatory pulse ox prior to discharge   Acute pulmonary embolism Bilateral LE DVT's Patient with elevated D-dimer measuring >20. V/Q scan confirms present of Danielle [name] pulmonary embolism. Associated demand ischemia that could indicate Danielle [name] level of heart strain. Initial Transthoracic Echocardiogram did not well visualize the RV. -Transition to Eliquis today -Follow-up limited Transthoracic Echocardiogram for RV evaluation -> appears moderately reduced in some views (will need repeat US within 3 months) - hemodynamically stable  -Check LE venous duplex (with bilateral age indeterminate DVT's involving bilateral popliteal, posterior tibial, and peroneal veins)   Elevated troponin Presumed demand ischemia. No chest pain. Transthoracic Echocardiogram ordered and shows slightly more reduced LVEF of 40-45%.  Likely secondary to pulmonary embolism. Needs cardiology follow up    AKI on CKD stage IIIb Baseline creatinine of about 1.6. Creatinine of 3.13 on admission with  peak of 3.40. Creatinine improved to  1.2. AKI resolved.   Nausea/vomiting/diarrhea CT abdomen/pelvis without evidence of acute inflammation. resolved   Primary hypertension Noted. Patient is on spironolactone and Coreg as an outpatient.   Paroxysmal atrial fibrillation Noted. Patient is on Coreg and Eliquis as an outpatient.   Chronic heart failure with reduced ejection fracture Last LVEF from 2023 significant for an LVEF of 50% with grade 1 diastolic dysfunction. Patient is on Coreg and spironolactone Transthoracic Echocardiogram ordered this admission (difficult study) and is significant for newly reduced LVEF of 40-45%. Patient will need to follow-up with cardiology as an outpatient. -Continue home Coreg -Continue losartan   Hyperthyroidism TSH and free T4 within normal limits.   Thrombocytopenia Chronic. Stable. Follow outpatient    Aortic atherosclerosis Noted on imaging.   History of bioprosthetic aortic valve Noted. From 2016.    Obesity Body mass index is 39.12 kg/m.    DVT prophylaxis: eliquis Code Status: DNR Family Communication: none Disposition:   Status is: Inpatient Remains inpatient appropriate because: pending placement   Consultants:  none  Procedures:  LE [place] Summary:  BILATERAL:  -No evidence of popliteal cyst, bilaterally.  RIGHT:   - Findings consistent with age indeterminate deep vein thrombosis  involving the right popliteal vein, right posterior tibial veins, and  right peroneal veins.   Echo 10/29  IMPRESSIONS     1. Very poor acoustical windows with very poor endocardial border  definition even with definity contrast. Left ventricular ejection fraction  appears at least mildly reduced.   2. Right ventricular systolic function appears moderately reduced in some  views. The right ventricular size is mildly dilated.   3. The mitral valve is normal in structure.   4. The aortic valve was not well visualized. Aortic valve regurgitation  is not visualized. There is  Danielle [name] 23 mm St. Jude Trifecta valve present in the  aortic position.   Echo 10/28 IMPRESSIONS     1. LVEF difficult to quantify even with definity, likely at least mildly  reduced. Left ventricular ejection fraction, by estimation, is 40 to 45%.  The left ventricle has mildly decreased function. Left ventricular  endocardial border not optimally  defined to evaluate regional wall motion. Indeterminate diastolic filling  due to E-Farid Grigorian fusion.   2. Right ventricular systolic function was not well visualized. The right  ventricular size is not well visualized. Tricuspid regurgitation signal is  inadequate for assessing PA pressure.   3. The mitral valve is grossly normal. No evidence of mitral valve  regurgitation.   4. The aortic valve has been repaired/replaced. Aortic valve  regurgitation is not visualized. There is Naftali Carchi 23 mm St. Jude Trifecta valve  present in the aortic position. Procedure Date: 03/12/14. Echo findings are  consistent with normal structure and  function of the aortic valve prosthesis. Aortic valve area, by VTI  measures 1.08 cm. Aortic valve mean gradient measures 12.5 mmHg. Aortic  valve Vmax measures 2.38 m/s.   5. The inferior vena cava is dilated in size with <50% respiratory  variability, suggesting right atrial pressure of 15 mmHg.   Antimicrobials:  Anti-infectives (From admission, onward)    Start     Dose/Rate Route Frequency Ordered Stop   12/28/22 1000  cefdinir (OMNICEF) capsule 300 mg        300 mg Oral Every 12 hours 12/27/22 1543 12/29/22 0826   12/28/22 1000  azithromycin (ZITHROMAX) tablet 500 mg        500 mg  Oral Daily 12/27/22 1543 12/29/22 0825   12/26/22 1000  cefTRIAXone (ROCEPHIN) 2 g in sodium chloride 0.9 % 100 mL IVPB  Status:  Discontinued        2 g 200 mL/hr over 30 Minutes Intravenous Every 24 hours 12/25/22 1024 12/27/22 1543   12/26/22 1000  azithromycin (ZITHROMAX) 500 mg in sodium chloride 0.9 % 250 mL IVPB  Status:  Discontinued         500 mg 250 mL/hr over 60 Minutes Intravenous Every 24 hours 12/25/22 1024 12/27/22 1543   12/25/22 0600  vancomycin (VANCOREADY) IVPB 2000 mg/400 mL        2,000 mg 200 mL/hr over 120 Minutes Intravenous  Once 12/25/22 0545 12/25/22 0922   12/25/22 0545  cefTRIAXone (ROCEPHIN) 1 g in sodium chloride 0.9 % 100 mL IVPB        1 g 200 mL/hr over 30 Minutes Intravenous  Once 12/25/22 0538 12/25/22 0733   12/25/22 0545  azithromycin (ZITHROMAX) 500 mg in sodium chloride 0.9 % 250 mL IVPB        500 mg 250 mL/hr over 60 Minutes Intravenous  Once 12/25/22 0538 12/25/22 1127       Subjective: No new complaints  Objective: Vitals:   01/01/23 2237 01/02/23 0343 01/02/23 0345 01/02/23 0452  BP: 92/72     Pulse:  77 77   Resp: 19 18    Temp:  97.8 F (36.6 C) 97.8 F (36.6 C)   TempSrc:  Oral Oral   SpO2: 95% 90%    Weight:    113.3 kg  Height:        Intake/Output Summary (Last 24 hours) at 01/02/2023 1149 Last data filed at 01/02/2023 0231 Gross per 24 hour  Intake 120 ml  Output 500 ml  Net -380 ml   Filed Weights   12/31/22 0353 01/01/23 0605 01/02/23 0452  Weight: 110.2 kg 109.5 kg 113.3 kg    Examination:  General: No acute distress, sitting up in chair for the first time that I've seen today. Cardiovascular: RRR Lungs: unlabored Neurological: Alert and oriented 3. Moves all extremities 4 with equal strength. Cranial nerves II through XII grossly intact. Extremities: L knee pain improved    Data Reviewed: I have personally reviewed following labs and imaging studies  CBC: Recent Labs  Lab 12/27/22 0639 12/28/22 0521 12/30/22 0545 12/31/22 0633  WBC 12.6* 9.8 7.5 7.6  NEUTROABS  --   --  4.9 5.0  HGB 11.0* 10.9* 12.5 12.4  HCT 33.7* 33.3* 37.0 37.2  MCV 95.5 96.0 94.1 95.1  PLT 75* 72* 82* 92*    Basic Metabolic Panel: Recent Labs  Lab 12/27/22 0639 12/27/22 2345 12/30/22 0545 12/31/22 0633  NA 139  --  136 137  K 3.9 4.4 4.0 4.1  CL 113*   --  109 111  CO2 20*  --  18* 23  GLUCOSE 89  --  82 95  BUN 25*  --  17 15  CREATININE 1.54*  --  1.30* 1.23*  CALCIUM 8.5*  --  9.0 9.0  MG  --  2.0 1.8 1.9  PHOS  --   --  3.4 3.6    GFR: Estimated Creatinine Clearance: 50.6 mL/min (Tank Difiore) (by C-G formula based on SCr of 1.23 mg/dL (H)).  Liver Function Tests: Recent Labs  Lab 12/30/22 0545 12/31/22 0633  AST 20 15  ALT 56* 42  ALKPHOS 63 65  BILITOT 0.8 0.6  PROT 5.4* 5.3*  ALBUMIN 2.5* 2.5*    CBG: No results for input(s): "GLUCAP" in the last 168 hours.   Recent Results (from the past 240 hour(s))  Resp panel by RT-PCR (RSV, Flu Azarria Balint&B, Covid) Anterior Nasal Swab     Status: None   Collection Time: 12/25/22  4:47 AM   Specimen: Anterior Nasal Swab  Result Value Ref Range Status   SARS Coronavirus 2 by RT PCR NEGATIVE NEGATIVE Final   Influenza Myrtle Barnhard by PCR NEGATIVE NEGATIVE Final   Influenza B by PCR NEGATIVE NEGATIVE Final    Comment: (NOTE) The Xpert Xpress SARS-CoV-2/FLU/RSV plus assay is intended as an aid in the diagnosis of influenza from Nasopharyngeal swab specimens and should not be used as Desia Saban sole basis for treatment. Nasal washings and aspirates are unacceptable for Xpert Xpress SARS-CoV-2/FLU/RSV testing.  Fact Sheet for Patients: BloggerCourse.com  Fact Sheet for Healthcare Providers: SeriousBroker.it  This test is not yet approved or cleared by the [place] FDA and has been authorized for detection and/or diagnosis of SARS-CoV-2 by FDA under an Emergency Use Authorization (EUA). This EUA will remain in effect (meaning this test can be used) for the duration of the COVID-19 declaration under Section 564(b)(1) of the Act, 21 U.S.C. section 360bbb-3(b)(1), unless the authorization is terminated or revoked.     Resp Syncytial Virus by PCR NEGATIVE NEGATIVE Final    Comment: (NOTE) Fact Sheet for  Patients: BloggerCourse.com  Fact Sheet for Healthcare Providers: SeriousBroker.it  This test is not yet approved or cleared by the [place] FDA and has been authorized for detection and/or diagnosis of SARS-CoV-2 by FDA under an Emergency Use Authorization (EUA). This EUA will remain in effect (meaning this test can be used) for the duration of the COVID-19 declaration under Section 564(b)(1) of the Act, 21 U.S.C. section 360bbb-3(b)(1), unless the authorization is terminated or revoked.  Performed at [facility], [address] [place], [place] [address]   Blood culture (routine x 2)     Status: None   Collection Time: 12/25/22  6:12 AM   Specimen: BLOOD RIGHT HAND  Result Value Ref Range Status   Specimen Description BLOOD RIGHT HAND  Final   Special Requests   Final    BOTTLES DRAWN AEROBIC ONLY Blood Culture adequate volume   Culture   Final    NO GROWTH 5 DAYS Performed at [facility], [address] [place], [place] [address]    Report Status 12/30/2022 FINAL  Final  Blood culture (routine x 2)     Status: None   Collection Time: 12/25/22  6:25 AM   Specimen: BLOOD  Result Value Ref Range Status   Specimen Description BLOOD SITE NOT SPECIFIED  Final   Special Requests   Final    BOTTLES DRAWN AEROBIC AND ANAEROBIC Blood Culture adequate volume   Culture   Final    NO GROWTH 5 DAYS Performed at [facility], [address], [place], [place] [address]    Report Status 12/30/2022 FINAL  Final         Radiology Studies: DG Knee 1-2 Views Left  Result Date: 12/31/2022 CLINICAL DATA:  Pain EXAM: LEFT KNEE - 1-2 VIEW COMPARISON:  None. FINDINGS: No fracture or dislocation is seen. Severe tricompartmental degenerative changes, most prominent in the medial compartment. IM nail in the distal femur, incompletely visualized. Small suprapatellar knee joint effusion. IMPRESSION: No fracture or  dislocation is seen. Severe tricompartmental degenerative changes, as above. Electronically Signed  By: Charline Bills M.D.   On: 12/31/2022 21:53        Scheduled Meds:  acetaminophen  1,000 mg Oral Q8H   apixaban  10 mg Oral BID   Followed by   Melene Muller ON 01/04/2023] apixaban  5 mg Oral BID   arformoterol  15 mcg Nebulization BID   budesonide (PULMICORT) nebulizer solution  0.5 mg Nebulization BID   carvedilol  25 mg Oral BID WC   lidocaine  2 patch Transdermal Q24H   losartan  25 mg Oral Daily   polyethylene glycol  17 g Oral Daily   sodium chloride flush  3 mL Intravenous Q12H   Continuous Infusions:   LOS: 8 days    Time spent: over 30 min    Lacretia Nicks, MD [facility]   To contact the attending provider between 7A-7P or the covering provider during after hours 7P-7A, please log into the web site [email or web address] and access using universal [place] password for that web site. If you do not have the password, please call the hospital operator.  01/02/2023, 11:49 AM

## 2023-01-02 NOTE — Progress Notes (Signed)
Pt had 5 bts NSVT on telemetry.  Pt is asymptomatic.  VSS.  K 4.3, Mg 2.1.  Will notify MD and continue to monitor closely.  T

## 2023-01-03 ENCOUNTER — Other Ambulatory Visit: Payer: Self-pay

## 2023-01-03 DIAGNOSIS — J9601 Acute respiratory failure with hypoxia: Secondary | ICD-10-CM | POA: Diagnosis not present

## 2023-01-03 LAB — BASIC METABOLIC PANEL
Anion gap: 8 (ref 5–15)
BUN: 21 mg/dL (ref 8–23)
CO2: 22 mmol/L (ref 22–32)
Calcium: 9.1 mg/dL (ref 8.9–10.3)
Chloride: 106 mmol/L (ref 98–111)
Creatinine, Ser: 1.54 mg/dL — ABNORMAL HIGH (ref 0.44–1.00)
GFR, Estimated: 35 mL/min — ABNORMAL LOW (ref >60)
Glucose, Bld: 90 mg/dL (ref 70–99)
Potassium: 4.7 mmol/L (ref 3.5–5.1)
Sodium: 136 mmol/L (ref 135–145)

## 2023-01-03 NOTE — Progress Notes (Signed)
PROGRESS NOTE    Danielle [name]  IRJ:[id number] DOB: [birth year] DOA: 12/25/2022 PCP: [place], [name], MD  Chief Complaint  Patient presents with   Shortness of Breath    Patient to ED via ems with complaint of sob lower 70% range on room air having difficulty speaking. Patient was found to be pale and diaphoretic. EMS reports patient was hypotensive with Sunset Joshi BP 70/40. EMS reports giving patient 150 mL NS through Jakki Doughty 22 gauge IV in left wrist.EMS reports placing Aydia Maj NRB on patient which improved O2 saturation into 90% range.    Brief Narrative:   Danielle [name] is Danielle [name] 77 y.o. female with Messiyah Waterson history of hypertension, hyperlipidemia, CAD, heart failure with reduced EF, paroxysmal atrial fibrillation, COPD, hyperthyroidism, thrombocytopenia, GERD, obesity. Patient presented secondary to shortness of breath with hypoxia and found to have evidence of sepsis with presumed pneumonia. Also concern for possible acute PE, started on Heparin IV. Blood cultures obtained and antibiotics started. V/Q scan obtained on 10/28 and confirms presence of Fredda Clarida pulmonary embolism.   Assessment & Plan:   Principal Problem:   Acute respiratory failure with hypoxia (HCC) Active Problems:   SIRS (systemic inflammatory response syndrome) (HCC)   Elevated troponin   Acute kidney injury superimposed on chronic kidney disease (HCC)   Nausea, vomiting, and diarrhea   Essential hypertension   Paroxysmal atrial fibrillation (HCC)   Chronic HFrEF (heart failure with reduced ejection fraction) (HCC)   Hyperthyroidism   Obesity (BMI 30-39.9)   S/P aortic valve replacement with bioprosthetic valve   Acute pulmonary embolism (HCC)  Mechanical Fall  Left Knee Pain Osteoarthritis  Slid to ground (assisted to ground by mobility tech) - no significant trauma with fall, but she notes when getting up, she used her knee and had significant pain.  Notable TTP and pain with ROM, will get plain films.   X ray with  severe tricompartmental degenerative changes Will continue to monitor for now, low threshold for additional imaging   Severe sepsis Present on admission with presumed pneumonia. Blood cultures obtained. Antibiotics initiated for pneumonia. Blood cultures no growth x4 days. -Continue antibiotics -Follow-up blood cultures   Presumed community acquired pneumonia Patient with hypoxia and fever with x-ray evidence suggesting possible left lung base infection. Patient started empirically on Ceftriaxone and azithromycin. -completed abx with cefdinir and azithro    COPD exacerbation Present on admission. Patient treated with steroids and breathing treatments. No wheezing noted. -Continue Duonebs, Brovana and budesonide   Acute respiratory failure with hypoxia Likely related to pulmonary embolism. Patient has required up to non-rebreather, now down to room air at rest. Patient continues to require up to 2 L/min with ambulation. - wean o2 as tolerated - currently on RA -Ambulatory pulse ox prior to discharge   Acute pulmonary embolism Bilateral LE DVT's Patient with elevated D-dimer measuring >20. V/Q scan confirms present of Teeghan Hammer pulmonary embolism. Associated demand ischemia that could indicate Daymond Cordts level of heart strain. Initial Transthoracic Echocardiogram did not well visualize the RV. -Transition to Eliquis today -Follow-up limited Transthoracic Echocardiogram for RV evaluation -> appears moderately reduced in some views (will need repeat US within 3 months) - hemodynamically stable  -Check LE venous duplex (with bilateral age indeterminate DVT's involving bilateral popliteal, posterior tibial, and peroneal veins)   Elevated troponin Presumed demand ischemia. No chest pain. Transthoracic Echocardiogram ordered and shows slightly more reduced LVEF of 40-45%.  Likely secondary to pulmonary embolism. Needs cardiology follow up    AKI on CKD stage IIIb  Baseline creatinine of about 1.6. Creatinine  of 3.13 on admission with peak of 3.40. Creatinine improved to 1.2. AKI resolved.   Nausea/vomiting/diarrhea CT abdomen/pelvis without evidence of acute inflammation. resolved   Primary hypertension Noted. Patient is on spironolactone and Coreg as an outpatient.   Paroxysmal atrial fibrillation Noted. Patient is on Coreg and Eliquis as an outpatient.   Chronic heart failure with reduced ejection fracture Last LVEF from 2023 significant for an LVEF of 50% with grade 1 diastolic dysfunction. Patient is on Coreg and spironolactone Transthoracic Echocardiogram ordered this admission (difficult study) and is significant for newly reduced LVEF of 40-45%. Patient will need to follow-up with cardiology as an outpatient. -Continue home Coreg -Continue losartan   Hyperthyroidism TSH and free T4 within normal limits.   Thrombocytopenia Chronic. Stable. Follow outpatient    Aortic atherosclerosis Noted on imaging.   History of bioprosthetic aortic valve Noted. From 2016.    Obesity Body mass index is 38.19 kg/m.    DVT prophylaxis: eliquis Code Status: DNR Family Communication: none Disposition:   Status is: Inpatient Remains inpatient appropriate because: pending placement   Consultants:  none  Procedures:  LE [place] Summary:  BILATERAL:  -No evidence of popliteal cyst, bilaterally.  RIGHT:   - Findings consistent with age indeterminate deep vein thrombosis  involving the right popliteal vein, right posterior tibial veins, and  right peroneal veins.   Echo 10/29  IMPRESSIONS     1. Very poor acoustical windows with very poor endocardial border  definition even with definity contrast. Left ventricular ejection fraction  appears at least mildly reduced.   2. Right ventricular systolic function appears moderately reduced in some  views. The right ventricular size is mildly dilated.   3. The mitral valve is normal in structure.   4. The aortic valve was not well  visualized. Aortic valve regurgitation  is not visualized. There is Donnavan Covault 23 mm St. Jude Trifecta valve present in the  aortic position.   Echo 10/28 IMPRESSIONS     1. LVEF difficult to quantify even with definity, likely at least mildly  reduced. Left ventricular ejection fraction, by estimation, is 40 to 45%.  The left ventricle has mildly decreased function. Left ventricular  endocardial border not optimally  defined to evaluate regional wall motion. Indeterminate diastolic filling  due to E-Malaka Ruffner fusion.   2. Right ventricular systolic function was not well visualized. The right  ventricular size is not well visualized. Tricuspid regurgitation signal is  inadequate for assessing PA pressure.   3. The mitral valve is grossly normal. No evidence of mitral valve  regurgitation.   4. The aortic valve has been repaired/replaced. Aortic valve  regurgitation is not visualized. There is Zaniah Titterington 23 mm St. Jude Trifecta valve  present in the aortic position. Procedure Date: 03/12/14. Echo findings are  consistent with normal structure and  function of the aortic valve prosthesis. Aortic valve area, by VTI  measures 1.08 cm. Aortic valve mean gradient measures 12.5 mmHg. Aortic  valve Vmax measures 2.38 m/s.   5. The inferior vena cava is dilated in size with <50% respiratory  variability, suggesting right atrial pressure of 15 mmHg.   Antimicrobials:  Anti-infectives (From admission, onward)    Start     Dose/Rate Route Frequency Ordered Stop   12/28/22 1000  cefdinir (OMNICEF) capsule 300 mg        300 mg Oral Every 12 hours 12/27/22 1543 12/29/22 0826   12/28/22 1000  azithromycin (ZITHROMAX) tablet  500 mg        500 mg Oral Daily 12/27/22 1543 12/29/22 0825   12/26/22 1000  cefTRIAXone (ROCEPHIN) 2 g in sodium chloride 0.9 % 100 mL IVPB  Status:  Discontinued        2 g 200 mL/hr over 30 Minutes Intravenous Every 24 hours 12/25/22 1024 12/27/22 1543   12/26/22 1000  azithromycin (ZITHROMAX)  500 mg in sodium chloride 0.9 % 250 mL IVPB  Status:  Discontinued        500 mg 250 mL/hr over 60 Minutes Intravenous Every 24 hours 12/25/22 1024 12/27/22 1543   12/25/22 0600  vancomycin (VANCOREADY) IVPB 2000 mg/400 mL        2,000 mg 200 mL/hr over 120 Minutes Intravenous  Once 12/25/22 0545 12/25/22 0922   12/25/22 0545  cefTRIAXone (ROCEPHIN) 1 g in sodium chloride 0.9 % 100 mL IVPB        1 g 200 mL/hr over 30 Minutes Intravenous  Once 12/25/22 0538 12/25/22 0733   12/25/22 0545  azithromycin (ZITHROMAX) 500 mg in sodium chloride 0.9 % 250 mL IVPB        500 mg 250 mL/hr over 60 Minutes Intravenous  Once 12/25/22 0538 12/25/22 1127       Subjective: No complaints  Objective: Vitals:   01/03/23 0328 01/03/23 0749 01/03/23 0808 01/03/23 1200  BP: 108/83  119/73 107/74  Pulse: 69 81 75 74  Resp: 16 15 16 17   Temp: 97.8 F (36.6 C)  98.1 F (36.7 C) 98.3 F (36.8 C)  TempSrc: Oral  Oral Oral  SpO2: 96%     Weight: 110.6 kg     Height:        Intake/Output Summary (Last 24 hours) at 01/03/2023 1418 Last data filed at 01/03/2023 0559 Gross per 24 hour  Intake 3 ml  Output 1300 ml  Net -1297 ml   Filed Weights   01/01/23 0605 01/02/23 0452 01/03/23 0328  Weight: 109.5 kg 113.3 kg 110.6 kg    Examination:  General: No acute distress. Cardiovascular: RRR Lungs: unlabored Neurological: Alert and oriented 3. Moves all extremities 4 with equal strength. Cranial nerves II through XII grossly intact. Skin: Warm and dry. No rashes or lesions. Extremities: L knee pain    Data Reviewed: I have personally reviewed following labs and imaging studies  CBC: Recent Labs  Lab 12/28/22 0521 12/30/22 0545 12/31/22 0633  WBC 9.8 7.5 7.6  NEUTROABS  --  4.9 5.0  HGB 10.9* 12.5 12.4  HCT 33.3* 37.0 37.2  MCV 96.0 94.1 95.1  PLT 72* 82* 92*    Basic Metabolic Panel: Recent Labs  Lab 12/27/22 2345 12/30/22 0545 12/31/22 0633 01/02/23 1418 01/03/23 0603  NA   --  136 137 136 136  K 4.4 4.0 4.1 4.3 4.7  CL  --  109 111 102 106  CO2  --  18* 23 23 22   GLUCOSE  --  82 95 90 90  BUN  --  17 15 21 21   CREATININE  --  1.30* 1.23* 1.59* 1.54*  CALCIUM  --  9.0 9.0 9.4 9.1  MG 2.0 1.8 1.9 2.1  --   PHOS  --  3.4 3.6  --   --     GFR: Estimated Creatinine Clearance: 39.8 mL/min (Savino Whisenant) (by C-G formula based on SCr of 1.54 mg/dL (H)).  Liver Function Tests: Recent Labs  Lab 12/30/22 0545 12/31/22 0633  AST 20 15  ALT 56* 42  ALKPHOS 63 65  BILITOT 0.8 0.6  PROT 5.4* 5.3*  ALBUMIN 2.5* 2.5*    CBG: No results for input(s): "GLUCAP" in the last 168 hours.   Recent Results (from the past 240 hour(s))  Resp panel by RT-PCR (RSV, Flu Jaleah Lefevre&B, Covid) Anterior Nasal Swab     Status: None   Collection Time: 12/25/22  4:47 AM   Specimen: Anterior Nasal Swab  Result Value Ref Range Status   SARS Coronavirus 2 by RT PCR NEGATIVE NEGATIVE Final   Influenza Rayli Wiederhold by PCR NEGATIVE NEGATIVE Final   Influenza B by PCR NEGATIVE NEGATIVE Final    Comment: (NOTE) The Xpert Xpress SARS-CoV-2/FLU/RSV plus assay is intended as an aid in the diagnosis of influenza from Nasopharyngeal swab specimens and should not be used as Thatiana Renbarger sole basis for treatment. Nasal washings and aspirates are unacceptable for Xpert Xpress SARS-CoV-2/FLU/RSV testing.  Fact Sheet for Patients: BloggerCourse.com  Fact Sheet for Healthcare Providers: SeriousBroker.it  This test is not yet approved or cleared by the [place] FDA and has been authorized for detection and/or diagnosis of SARS-CoV-2 by FDA under an Emergency Use Authorization (EUA). This EUA will remain in effect (meaning this test can be used) for the duration of the COVID-19 declaration under Section 564(b)(1) of the Act, 21 U.S.C. section 360bbb-3(b)(1), unless the authorization is terminated or revoked.     Resp Syncytial Virus by PCR NEGATIVE NEGATIVE Final     Comment: (NOTE) Fact Sheet for Patients: BloggerCourse.com  Fact Sheet for Healthcare Providers: SeriousBroker.it  This test is not yet approved or cleared by the [place] FDA and has been authorized for detection and/or diagnosis of SARS-CoV-2 by FDA under an Emergency Use Authorization (EUA). This EUA will remain in effect (meaning this test can be used) for the duration of the COVID-19 declaration under Section 564(b)(1) of the Act, 21 U.S.C. section 360bbb-3(b)(1), unless the authorization is terminated or revoked.  Performed at [facility], [address], [place], [place] [address]   Blood culture (routine x 2)     Status: None   Collection Time: 12/25/22  6:12 AM   Specimen: BLOOD RIGHT HAND  Result Value Ref Range Status   Specimen Description BLOOD RIGHT HAND  Final   Special Requests   Final    BOTTLES DRAWN AEROBIC ONLY Blood Culture adequate volume   Culture   Final    NO GROWTH 5 DAYS Performed at [facility], [address], [place], [place] [address]    Report Status 12/30/2022 FINAL  Final  Blood culture (routine x 2)     Status: None   Collection Time: 12/25/22  6:25 AM   Specimen: BLOOD  Result Value Ref Range Status   Specimen Description BLOOD SITE NOT SPECIFIED  Final   Special Requests   Final    BOTTLES DRAWN AEROBIC AND ANAEROBIC Blood Culture adequate volume   Culture   Final    NO GROWTH 5 DAYS Performed at [facility], [address], [place], [place] [address]    Report Status 12/30/2022 FINAL  Final         Radiology Studies: No results found.      Scheduled Meds:  [START ON 01/04/2023] apixaban  5 mg Oral BID   arformoterol  15 mcg Nebulization BID   budesonide (PULMICORT) nebulizer solution  0.5 mg Nebulization BID   carvedilol  25 mg Oral BID WC   lidocaine  2 patch Transdermal Q24H  losartan  25 mg Oral Daily   polyethylene glycol  17 g Oral Daily    sodium chloride flush  3 mL Intravenous Q12H   Continuous Infusions:   LOS: 9 days    Time spent: over 30 min    Lacretia Nicks, MD [facility]   To contact the attending provider between 7A-7P or the covering provider during after hours 7P-7A, please log into the web site [email or web address] and access using universal [place] password for that web site. If you do not have the password, please call the hospital operator.  01/03/2023, 2:18 PM

## 2023-01-03 NOTE — Plan of Care (Signed)
  Problem: Education: Goal: Knowledge of General Education information will improve Description: Including pain rating scale, medication(s)/side effects and non-pharmacologic comfort measures Outcome: Progressing   Problem: Health Behavior/Discharge Planning: Goal: Ability to manage health-related needs will improve Outcome: Progressing   Problem: Clinical Measurements: Goal: Ability to maintain clinical measurements within normal limits will improve Outcome: Progressing Goal: Will remain free from infection Outcome: Progressing Goal: Diagnostic test results will improve Outcome: Progressing Goal: Respiratory complications will improve Outcome: Progressing Goal: Cardiovascular complication will be avoided Outcome: Progressing   Problem: Activity: Goal: Risk for activity intolerance will decrease Outcome: Progressing   Problem: Nutrition: Goal: Adequate nutrition will be maintained Outcome: Progressing   Problem: Pain Management: Goal: General experience of comfort will improve Outcome: Progressing

## 2023-01-03 NOTE — Progress Notes (Signed)
Specialty Pharmacy Ongoing Clinical Assessment Note  Danielle [name] is a 77 y.o. female who is being followed by the specialty pharmacy service for RxSp Oncology   Patient's specialty medication(s) reviewed today: Eltrombopag Olamine   Missed doses in the last 4 weeks: 0  Husband was unable to report if [name] has been continued in the hospital.   Patient/Caregiver did not have any additional questions or concerns.   Therapeutic benefit summary: Unable to assess   Adverse events/side effects summary: No adverse events/side effects   Patient's therapy is appropriate to: Continue    Goals Addressed             This Visit's Progress    Stabilization of disease       Patient is on track. Patient will maintain adherence         Follow up:  3 months  Bobette Mo Specialty Pharmacist

## 2023-01-03 NOTE — TOC Progression Note (Signed)
Transition of Care [facility]) - Progression Note    Patient Details  Name: Danielle [name] MRN: [id number] Date of Birth: 1945/07/17  [facility] CM/SW Contact  Delilah Shan, LCSWA Phone Number: 01/03/2023, 3:18 PM  Clinical Narrative:     CSW spoke with Bjorn Loser with [facility]. Patients insurance authorization currently pending.Patients insurance is requesting current PT note. CSW informed PT and MD. CSW awaiting current PT note. CSW will continue to follow and assist with patients dc planning needs.  Expected Discharge Plan: Skilled Nursing Facility Barriers to Discharge: Continued Medical Work up  Expected Discharge Plan and Services In-house Referral: Clinical Social Work Discharge Planning Services: CM Consult Post Acute Care Choice: Skilled Nursing Facility Living arrangements for the past 2 months: Single Family Home                           HH Arranged: PT, OT HH Agency: [facility] Date [facility] Agency Contacted: 12/27/22 Time HH Agency Contacted: 1423 Representative spoke with at [facility] Agency: Kandee Keen   Social Determinants of Health (SDOH) Interventions SDOH Screenings   Food Insecurity: No Food Insecurity (12/25/2022)  Housing: Low Risk  (12/25/2022)  Transportation Needs: No Transportation Needs (12/25/2022)  Utilities: Not At Risk (12/25/2022)  Alcohol Screen: Low Risk  (02/01/2022)  Depression (PHQ2-9): Low Risk  (06/04/2022)  Recent Concern: Depression (PHQ2-9) - Medium Risk (04/09/2022)  Financial Resource Strain: Low Risk  (02/01/2022)  Recent Concern: Financial Resource Strain - Medium Risk (01/25/2022)  Physical Activity: Inactive (02/01/2022)  Social Connections: Socially Integrated (02/01/2022)  Stress: No Stress Concern Present (02/01/2022)  Tobacco Use: Medium Risk (12/27/2022)    Readmission Risk Interventions     No data to display

## 2023-01-03 NOTE — Progress Notes (Signed)
Specialty Pharmacy Refill Coordination Note  Danielle [name] is a 77 y.o. female who's husband, Ross Marcus was contacted today regarding refills of specialty medication(s) Eltrombopag Olamine  Patient is currently in the hospital, but will need a refill once discharged.   Husband requested Delivery   Delivery date: 01/06/23   Verified address: [id number]  Medication will be filled on 01/05/23.

## 2023-01-03 NOTE — Progress Notes (Signed)
Mobility Specialist Progress Note:   01/03/23 1500  Mobility  Activity Transferred from bed to chair  Level of Assistance +2 (takes two people)  Press photographer wheel walker  Distance Ambulated (ft) 4 ft  Activity Response Tolerated well  Mobility Referral Yes  $Mobility charge 1 Mobility  Mobility Specialist Start Time (ACUTE ONLY) 1453  Mobility Specialist Stop Time (ACUTE ONLY) 1507  Mobility Specialist Time Calculation (min) (ACUTE ONLY) 14 min    Pt received in bed, agreeable to mobility. Very anxious and self limiting. Required modA+2 to stand and pivot to chair. Denied any pain other than chronic knee pain. Pt left in chair with call bell and all needs met. Chair alarm on.  Danielle [name] Mobility Specialist Please contact via Special educational needs teacher or Rehab office at [phone]

## 2023-01-04 DIAGNOSIS — J9601 Acute respiratory failure with hypoxia: Secondary | ICD-10-CM | POA: Diagnosis not present

## 2023-01-04 NOTE — Progress Notes (Signed)
PROGRESS NOTE    Danielle [name]  LKG:[id number] DOB: 1945/10/28 DOA: 12/25/2022 PCP: [place], [name], MD  Chief Complaint  Patient presents with   Shortness of Breath    Patient to ED via ems with complaint of sob lower 70% range on room air having difficulty speaking. Patient was found to be pale and diaphoretic. EMS reports patient was hypotensive with Danielle [name] BP 70/40. EMS reports giving patient 150 mL NS through Danielle [name] 22 gauge IV in left wrist.EMS reports placing Danielle [name] NRB on patient which improved O2 saturation into 90% range.    Brief Narrative:   Danielle [name] is Danielle [name] 77 y.o. female with [name] history of hypertension, hyperlipidemia, CAD, heart failure with reduced EF, paroxysmal atrial fibrillation, COPD, hyperthyroidism, thrombocytopenia, GERD, obesity. Patient presented secondary to shortness of breath with hypoxia and found to have evidence of sepsis with presumed pneumonia. Also concern for possible acute PE, started on Heparin IV. Blood cultures obtained and antibiotics started. V/Q scan obtained on 10/28 and confirms presence of [name] pulmonary embolism.   Awaiting SNF.   Assessment & Plan:   Principal Problem:   Acute respiratory failure with hypoxia (HCC) Active Problems:   SIRS (systemic inflammatory response syndrome) (HCC)   Elevated troponin   Acute kidney injury superimposed on chronic kidney disease (HCC)   Nausea, vomiting, and diarrhea   Essential hypertension   Paroxysmal atrial fibrillation (HCC)   Chronic HFrEF (heart failure with reduced ejection fraction) (HCC)   Hyperthyroidism   Obesity (BMI 30-39.9)   S/P aortic valve replacement with bioprosthetic valve   Acute pulmonary embolism (HCC)  Mechanical Fall  Left Knee Pain Osteoarthritis  Fall on 11/1.  Slid to ground (assisted to ground by mobility tech) - no significant trauma with fall, but she notes when getting up, she used her knee and had significant pain.  Notable TTP and pain with ROM,  will get plain films.   X ray with severe tricompartmental degenerative changes Will continue to monitor for now, low threshold for additional imaging -> suspect OA aggravated after she fell.  Consider additional imaging if not improving.   Severe sepsis Present on admission with presumed pneumonia. Blood cultures obtained. Antibiotics initiated for pneumonia. Blood cultures no growth x4 days. -Continue antibiotics -Follow-up blood cultures   Presumed community acquired pneumonia Patient with hypoxia and fever with x-ray evidence suggesting possible left lung base infection. Patient started empirically on Ceftriaxone and azithromycin. -completed abx with cefdinir and azithro    COPD exacerbation Present on admission. Patient treated with steroids and breathing treatments. No wheezing noted. -Continue Duonebs, Brovana and budesonide   Acute respiratory failure with hypoxia Likely related to pulmonary embolism. Patient has required up to non-rebreather, now down to room air at rest. Patient continues to require up to 2 L/min with ambulation. - wean o2 as tolerated - currently on RA -Ambulatory pulse ox prior to discharge   Acute pulmonary embolism Bilateral LE DVT's Patient with elevated D-dimer measuring >20. V/Q scan confirms present of Danielle [name] pulmonary embolism. Associated demand ischemia that could indicate Danielle [name] level of heart strain. Initial Transthoracic Echocardiogram did not well visualize the RV. -Transition to Eliquis today -Follow-up limited Transthoracic Echocardiogram for RV evaluation -> appears moderately reduced in some views (will need repeat US within 3 months) - hemodynamically stable  -Check LE venous duplex (with bilateral age indeterminate DVT's involving bilateral popliteal, posterior tibial, and peroneal veins)   Elevated troponin Presumed demand ischemia. No chest pain. Transthoracic Echocardiogram ordered and shows slightly more  reduced LVEF of 40-45%.  Likely secondary  to pulmonary embolism. Needs cardiology follow up    AKI on CKD stage IIIb Baseline creatinine of about 1.6. Creatinine of 3.13 on admission with peak of 3.40. AKI resolved.   Nausea/vomiting/diarrhea CT abdomen/pelvis without evidence of acute inflammation. resolved   Primary hypertension Noted. Patient is on spironolactone and Coreg as an outpatient.   Paroxysmal atrial fibrillation Noted. Patient is on Coreg and Eliquis as an outpatient.   Chronic heart failure with reduced ejection fracture Last LVEF from 2023 significant for an LVEF of 50% with grade 1 diastolic dysfunction. Patient is on Coreg and spironolactone Transthoracic Echocardiogram ordered this admission (difficult study) and is significant for newly reduced LVEF of 40-45%. Patient will need to follow-up with cardiology as an outpatient. -Continue home Coreg -Continue losartan   Hyperthyroidism TSH and free T4 within normal limits.   Thrombocytopenia Chronic. Stable. Follow outpatient    Aortic atherosclerosis Noted on imaging.   History of bioprosthetic aortic valve Noted. From 2016.    Obesity Body mass index is 37.88 kg/m.    DVT prophylaxis: eliquis Code Status: DNR Family Communication: none Disposition:   Status is: Inpatient Remains inpatient appropriate because: pending placement   Consultants:  none  Procedures:  LE [place] Summary:  BILATERAL:  -No evidence of popliteal cyst, bilaterally.  RIGHT:   - Findings consistent with age indeterminate deep vein thrombosis  involving the right popliteal vein, right posterior tibial veins, and  right peroneal veins.   Echo 10/29  IMPRESSIONS     1. Very poor acoustical windows with very poor endocardial border  definition even with definity contrast. Left ventricular ejection fraction  appears at least mildly reduced.   2. Right ventricular systolic function appears moderately reduced in some  views. The right ventricular size is mildly  dilated.   3. The mitral valve is normal in structure.   4. The aortic valve was not well visualized. Aortic valve regurgitation  is not visualized. There is Danielle [name] 23 mm St. Jude Trifecta valve present in the  aortic position.   Echo 10/28 IMPRESSIONS     1. LVEF difficult to quantify even with definity, likely at least mildly  reduced. Left ventricular ejection fraction, by estimation, is 40 to 45%.  The left ventricle has mildly decreased function. Left ventricular  endocardial border not optimally  defined to evaluate regional wall motion. Indeterminate diastolic filling  due to E-Kimari Coudriet fusion.   2. Right ventricular systolic function was not well visualized. The right  ventricular size is not well visualized. Tricuspid regurgitation signal is  inadequate for assessing PA pressure.   3. The mitral valve is grossly normal. No evidence of mitral valve  regurgitation.   4. The aortic valve has been repaired/replaced. Aortic valve  regurgitation is not visualized. There is Annsley Akkerman 23 mm St. Jude Trifecta valve  present in the aortic position. Procedure Date: 03/12/14. Echo findings are  consistent with normal structure and  function of the aortic valve prosthesis. Aortic valve area, by VTI  measures 1.08 cm. Aortic valve mean gradient measures 12.5 mmHg. Aortic  valve Vmax measures 2.38 m/s.   5. The inferior vena cava is dilated in size with <50% respiratory  variability, suggesting right atrial pressure of 15 mmHg.   Antimicrobials:  Anti-infectives (From admission, onward)    Start     Dose/Rate Route Frequency Ordered Stop   12/28/22 1000  cefdinir (OMNICEF) capsule 300 mg  300 mg Oral Every 12 hours 12/27/22 1543 12/29/22 0826   12/28/22 1000  azithromycin (ZITHROMAX) tablet 500 mg        500 mg Oral Daily 12/27/22 1543 12/29/22 0825   12/26/22 1000  cefTRIAXone (ROCEPHIN) 2 g in sodium chloride 0.9 % 100 mL IVPB  Status:  Discontinued        2 g 200 mL/hr over 30 Minutes  Intravenous Every 24 hours 12/25/22 1024 12/27/22 1543   12/26/22 1000  azithromycin (ZITHROMAX) 500 mg in sodium chloride 0.9 % 250 mL IVPB  Status:  Discontinued        500 mg 250 mL/hr over 60 Minutes Intravenous Every 24 hours 12/25/22 1024 12/27/22 1543   12/25/22 0600  vancomycin (VANCOREADY) IVPB 2000 mg/400 mL        2,000 mg 200 mL/hr over 120 Minutes Intravenous  Once 12/25/22 0545 12/25/22 0922   12/25/22 0545  cefTRIAXone (ROCEPHIN) 1 g in sodium chloride 0.9 % 100 mL IVPB        1 g 200 mL/hr over 30 Minutes Intravenous  Once 12/25/22 0538 12/25/22 0733   12/25/22 0545  azithromycin (ZITHROMAX) 500 mg in sodium chloride 0.9 % 250 mL IVPB        500 mg 250 mL/hr over 60 Minutes Intravenous  Once 12/25/22 0538 12/25/22 1127       Subjective: No complaints  Objective: Vitals:   01/04/23 0852 01/04/23 1402 01/04/23 1603 01/04/23 1952  BP:  (!) 87/54 130/86   Pulse:    88  Resp:  17 15   Temp:  98.8 F (37.1 C)    TempSrc:  Oral    SpO2: 97% 99% 98%   Weight:      Height:        Intake/Output Summary (Last 24 hours) at 01/04/2023 2035 Last data filed at 01/04/2023 1000 Gross per 24 hour  Intake 240 ml  Output 1250 ml  Net -1010 ml   Filed Weights   01/02/23 0452 01/03/23 0328 01/04/23 0329  Weight: 113.3 kg 110.6 kg 109.7 kg    Examination:  General: No acute distress. Cardiovascular: RRR Lungs: unlabored Neurological: Alert and oriented 3. Moves all extremities 4 with equal strength. Cranial nerves II through XII grossly intact. Extremities: L knee pain  Data Reviewed: I have personally reviewed following labs and imaging studies  CBC: Recent Labs  Lab 12/30/22 0545 12/31/22 0633  WBC 7.5 7.6  NEUTROABS 4.9 5.0  HGB 12.5 12.4  HCT 37.0 37.2  MCV 94.1 95.1  PLT 82* 92*    Basic Metabolic Panel: Recent Labs  Lab 12/30/22 0545 12/31/22 0633 01/02/23 1418 01/03/23 0603  NA 136 137 136 136  K 4.0 4.1 4.3 4.7  CL 109 111 102 106  CO2  18* 23 23 22   GLUCOSE 82 95 90 90  BUN 17 15 21 21   CREATININE 1.30* 1.23* 1.59* 1.54*  CALCIUM 9.0 9.0 9.4 9.1  MG 1.8 1.9 2.1  --   PHOS 3.4 3.6  --   --     GFR: Estimated Creatinine Clearance: 39.6 mL/min (Cloma Rahrig) (by C-G formula based on SCr of 1.54 mg/dL (H)).  Liver Function Tests: Recent Labs  Lab 12/30/22 0545 12/31/22 0633  AST 20 15  ALT 56* 42  ALKPHOS 63 65  BILITOT 0.8 0.6  PROT 5.4* 5.3*  ALBUMIN 2.5* 2.5*    CBG: No results for input(s): "GLUCAP" in the last 168 hours.   No results found for  this or any previous visit (from the past 240 hour(s)).        Radiology Studies: No results found.      Scheduled Meds:  apixaban  5 mg Oral BID   arformoterol  15 mcg Nebulization BID   budesonide (PULMICORT) nebulizer solution  0.5 mg Nebulization BID   carvedilol  25 mg Oral BID WC   lidocaine  2 patch Transdermal Q24H   losartan  25 mg Oral Daily   polyethylene glycol  17 g Oral Daily   sodium chloride flush  3 mL Intravenous Q12H   Continuous Infusions:   LOS: 10 days    Time spent: over 30 min    Lacretia Nicks, MD [facility]   To contact the attending provider between 7A-7P or the covering provider during after hours 7P-7A, please log into the web site [email or web address] and access using universal [place] password for that web site. If you do not have the password, please call the hospital operator.  01/04/2023, 8:35 PM

## 2023-01-04 NOTE — Plan of Care (Signed)

## 2023-01-04 NOTE — Progress Notes (Signed)
Occupational Therapy Treatment Patient Details Name: Danielle [name] MRN: [id number] DOB: 1945-05-17 Today's Date: 01/04/2023   History of present illness Pt is 77 year old presented [facility] on  12/25/22 for acute respiratory failure with hypoxia and copd exacerbation. VQ scan confirms PE 10/28, age indeterminate B LE DVTs. PMH - anxiety, breast cancer, COPD, CHF, CAD, HTN, and CVA with residual RUE weakness, Lt hip fx with IM nail   OT comments  Pt making gradual progress towards OT goals though limited by L knee pain and fatigue after prior PT session. Pt able to briefly stand at sink for ADLs w/ CGA-Min A overall. Provided heat pack for L knee at end of session w/ pt up in chair. Continue to recommend inpatient follow up therapy, <3 hours/day at DC.      If plan is discharge home, recommend the following:  A little help with walking and/or transfers;A lot of help with bathing/dressing/bathroom   Equipment Recommendations  None recommended by OT    Recommendations for Other Services      Precautions / Restrictions Precautions Precautions: Fall Precaution Comments: watch SpO2. HR and SOB Restrictions Weight Bearing Restrictions: No       Mobility Bed Mobility               General bed mobility comments: received in recliner at end of PT session    Transfers Overall transfer level: Needs assistance Equipment used: Rolling walker (2 wheels) Transfers: Sit to/from Stand Sit to Stand: Min assist           General transfer comment: x 2 from recliner     Balance Overall balance assessment: Needs assistance Sitting-balance support: No upper extremity supported, Feet supported Sitting balance-Leahy Scale: Fair     Standing balance support: Bilateral upper extremity supported, During functional activity, Reliant on assistive device for balance Standing balance-Leahy Scale: Poor                             ADL either performed or assessed  with clinical judgement   ADL Overall ADL's : Needs assistance/impaired     Grooming: Contact guard assist;Standing;Wash/dry face Grooming Details (indicate cue type and reason): able to briefly stand to wash face but limited by fatigue and L knee pain     Lower Body Bathing: Minimal assistance;Sitting/lateral leans;Sit to/from stand Lower Body Bathing Details (indicate cue type and reason): able to wash peri region in standing at sink briefly                            Extremity/Trunk Assessment Upper Extremity Assessment Upper Extremity Assessment: Generalized weakness;Right hand dominant   Lower Extremity Assessment Lower Extremity Assessment: Defer to PT evaluation        Vision   Vision Assessment?: No apparent visual deficits   Perception     Praxis      Cognition Arousal: Alert Behavior During Therapy: WFL for tasks assessed/performed Overall Cognitive Status: No family/caregiver present to determine baseline cognitive functioning                                 General Comments: possible memory deficits, benefits from problem solving cues        Exercises      Shoulder Instructions       General Comments SpO2 WFL on RA  Pertinent Vitals/ Pain       Pain Assessment Pain Assessment: Faces Faces Pain Scale: Hurts even more Pain Location: L knee Pain Descriptors / Indicators: Discomfort, Grimacing, Guarding Pain Intervention(s): Monitored during session, Heat applied  Home Living                                          Prior Functioning/Environment              Frequency  Min 1X/week        Progress Toward Goals  OT Goals(current goals can now be found in the care plan section)  Progress towards OT goals: Progressing toward goals  Acute Rehab OT Goals Patient Stated Goal: get up more, home soon OT Goal Formulation: With patient Time For Goal Achievement: 01/10/23 Potential to Achieve  Goals: Good ADL Goals Pt Will Perform Lower Body Dressing: with supervision;sitting/lateral leans;sit to/from stand;with adaptive equipment Pt Will Transfer to Toilet: with supervision;ambulating Pt/caregiver will Perform Home Exercise Program: Increased strength;With theraband;Both right and left upper extremity;Independently;With written HEP provided Additional ADL Goal #1: Pt to verbalize at least 2 energy conservation strategies to implement with ADLs/mobility  Plan      Co-evaluation                 AM-PAC OT "6 Clicks" Daily Activity     Outcome Measure   Help from another person eating meals?: None Help from another person taking care of personal grooming?: A Little Help from another person toileting, which includes using toliet, bedpan, or urinal?: A Lot Help from another person bathing (including washing, rinsing, drying)?: A Little Help from another person to put on and taking off regular upper body clothing?: A Little Help from another person to put on and taking off regular lower body clothing?: A Lot 6 Click Score: 17    End of Session Equipment Utilized During Treatment: Gait belt;Rolling walker (2 wheels)  OT Visit Diagnosis: Unsteadiness on feet (R26.81)   Activity Tolerance Patient tolerated treatment well;Patient limited by fatigue   Patient Left in chair;with call bell/phone within reach;with chair alarm set   Nurse Communication Mobility status;Patient requests pain meds        Time: 4010-2725 OT Time Calculation (min): 22 min  Charges: OT General Charges $OT Visit: 1 Visit OT Treatments $Self Care/Home Management : 8-22 mins  Bradd Canary, OTR/L Acute Rehab Services Office: [phone]   Lorre Munroe 01/04/2023, 11:25 AM

## 2023-01-04 NOTE — TOC Progression Note (Addendum)
Transition of Care [facility]) - Progression Note    Patient Details  Name: Danielle [name] MRN: [id number] Date of Birth: 03-Dec-1945  Transition of [facility] CM/SW Contact  Delilah Shan, LCSWA Phone Number: 01/04/2023, 12:09 PM  Clinical Narrative:     CSW LVM for Rhonda with [facility]. CSW awaiting call back to check on status of insurance authorization for SNF for patient. CSW will continue to follow and assist with patients dc planning needs.   Update- 3:35pm- CSW spoke with Bjorn Loser with [facility] who informed CSW they have tried to get through to insurance and unable to get through. That as far as she knows insurance authorization is still pending. Expected Discharge Plan: Skilled Nursing Facility Barriers to Discharge: Continued Medical Work up  Expected Discharge Plan and Services In-house Referral: Clinical Social Work Discharge Planning Services: CM Consult Post Acute Care Choice: Skilled Nursing Facility Living arrangements for the past 2 months: Single Family Home                           HH Arranged: PT, OT HH Agency: [facility] Date [facility] Agency Contacted: 12/27/22 Time HH Agency Contacted: 1423 Representative spoke with at [facility] Agency: Kandee Keen   Social Determinants of Health (SDOH) Interventions SDOH Screenings   Food Insecurity: No Food Insecurity (12/25/2022)  Housing: Low Risk  (12/25/2022)  Transportation Needs: No Transportation Needs (12/25/2022)  Utilities: Not At Risk (12/25/2022)  Alcohol Screen: Low Risk  (02/01/2022)  Depression (PHQ2-9): Low Risk  (06/04/2022)  Recent Concern: Depression (PHQ2-9) - Medium Risk (04/09/2022)  Financial Resource Strain: Low Risk  (02/01/2022)  Recent Concern: Financial Resource Strain - Medium Risk (01/25/2022)  Physical Activity: Inactive (02/01/2022)  Social Connections: Socially Integrated (02/01/2022)  Stress: No Stress Concern Present (02/01/2022)  Tobacco Use: Medium Risk (12/27/2022)     Readmission Risk Interventions     No data to display

## 2023-01-04 NOTE — Progress Notes (Signed)
Mobility Specialist Progress Note:   01/04/23 1500  Mobility  Activity Transferred to/from [facility]  Level of Assistance +2 (takes two people)  Location manager Ambulated (ft) 4 ft  Activity Response Tolerated well  Mobility Referral Yes  $Mobility charge 1 Mobility  Mobility Specialist Start Time (ACUTE ONLY) 1527  Mobility Specialist Stop Time (ACUTE ONLY) 1533  Mobility Specialist Time Calculation (min) (ACUTE ONLY) 6 min    Pt received in bed, requesting assistance to [facility]. Asymptomatic w/ no compaintsl. NT assisted at +2. Pt left on BSC with call bell in hand.  Danielle [name] Mobility Specialist Please contact via Special educational needs teacher or Rehab office at [phone]

## 2023-01-04 NOTE — Progress Notes (Signed)
Physical Therapy Treatment Patient Details Name: Danielle [name] MRN: [id number] DOB: [birth year] Today's Date: 01/04/2023   History of Present Illness Pt is 77 year old presented to [facility] on  12/25/22 for acute respiratory failure with hypoxia and copd exacerbation. VQ scan confirms PE 10/28, age indeterminate B LE DVTs. PMH - anxiety, breast cancer, COPD, CHF, CAD, HTN, and CVA with residual RUE weakness, Lt hip fx with IM nail    PT Comments  Pt pleasant and motivated during session. Able to progress ambulation to 76ft with RW and minA for safety and posturing. Difficulty with STS transfers from lower surfaces. Pt anxious with mobility due to recent controlled descent to floor. SPO2% >92% during transfers and ambulation on RA. Patient will benefit from continued inpatient follow up therapy, <3 hours/day     If plan is discharge home, recommend the following: A little help with walking and/or transfers;A little help with bathing/dressing/bathroom;Assistance with cooking/housework;Assist for transportation   Can travel by private vehicle     No  Equipment Recommendations  None recommended by PT    Recommendations for Other Services       Precautions / Restrictions Precautions Precautions: Fall Precaution Comments: watch SpO2. HR and SOB Restrictions Weight Bearing Restrictions: No     Mobility  Bed Mobility Overal bed mobility: Needs Assistance Bed Mobility: Supine to Sit     Supine to sit: Supervision, HOB elevated, Used rails     General bed mobility comments: Use of bedrails for mobility, min cueing for hand placemnt    Transfers Overall transfer level: Needs assistance Equipment used: Rolling walker (2 wheels) Transfers: Sit to/from Stand, Bed to chair/wheelchair/BSC Sit to Stand: Mod assist   Step pivot transfers: Min assist       General transfer comment: minA-modA  for STS, more assistance needed for lower surfaces. Constant cueing for hand  placement, keeping chest up, and not pulling on walker.    Ambulation/Gait Ambulation/Gait assistance: Min assist Gait Distance (Feet): 40 Feet Assistive device: Rolling walker (2 wheels) Gait Pattern/deviations: Step-to pattern, Decreased step length - right, Decreased step length - left, Trunk flexed Gait velocity: decr Gait velocity interpretation: <1.31 ft/sec, indicative of household ambulator   General Gait Details: assist for steadying and maintaining posture, cueing for chest up and staying within bounds of RW   Stairs             Wheelchair Mobility     Tilt Bed    Modified Rankin (Stroke Patients Only)       Balance Overall balance assessment: Needs assistance Sitting-balance support: No upper extremity supported, Feet supported Sitting balance-Leahy Scale: Fair Sitting balance - Comments: Seated EOB for BP readings and BSC setup   Standing balance support: Bilateral upper extremity supported, During functional activity, Reliant on assistive device for balance Standing balance-Leahy Scale: Poor Standing balance comment: Reliant on RW throughout standing activity with forward lean                            Cognition Arousal: Alert Behavior During Therapy: WFL for tasks assessed/performed Overall Cognitive Status: Within Functional Limits for tasks assessed                                          Exercises      General Comments General comments (skin integrity, edema, etc.): Pt on  RA SPO2% >92% throughout session. STS x3 from lowbed, elevated bed, and BSC.      Pertinent Vitals/Pain Pain Assessment Pain Assessment: Faces Faces Pain Scale: Hurts little more Pain Location: L LE Pain Descriptors / Indicators: Discomfort, Grimacing, Guarding Pain Intervention(s): Limited activity within patient's tolerance, Monitored during session    Home Living                          Prior Function            PT  Goals (current goals can now be found in the care plan section) Acute Rehab PT Goals PT Goal Formulation: With patient Time For Goal Achievement: 01/09/23 Potential to Achieve Goals: Good Progress towards PT goals: Progressing toward goals    Frequency    Min 1X/week      PT Plan      Co-evaluation              AM-PAC PT "6 Clicks" Mobility   Outcome Measure  Help needed turning from your back to your side while in a flat bed without using bedrails?: A Little Help needed moving from lying on your back to sitting on the side of a flat bed without using bedrails?: A Little Help needed moving to and from a bed to a chair (including a wheelchair)?: A Lot Help needed standing up from a chair using your arms (e.g., wheelchair or bedside chair)?: A Lot Help needed to walk in hospital room?: A Little Help needed climbing 3-5 steps with a railing? : A Lot 6 Click Score: 15    End of Session Equipment Utilized During Treatment: Gait belt Activity Tolerance: Patient tolerated treatment well Patient left: in chair;Other (comment) (with OT) Nurse Communication: Mobility status PT Visit Diagnosis: Other abnormalities of gait and mobility (R26.89);Muscle weakness (generalized) (M62.81);History of falling (Z91.81)     Time: 8295-6213 PT Time Calculation (min) (ACUTE ONLY): 30 min  Charges:    $Gait Training: 8-22 mins $Therapeutic Activity: 8-22 mins PT General Charges $$ ACUTE PT VISIT: 1 Visit                     Andrey Farmer SPT Secure chat preferred    Darlin Drop 01/04/2023, 10:46 AM

## 2023-01-05 DIAGNOSIS — J9601 Acute respiratory failure with hypoxia: Secondary | ICD-10-CM | POA: Diagnosis not present

## 2023-01-05 DIAGNOSIS — N179 Acute kidney failure, unspecified: Secondary | ICD-10-CM | POA: Diagnosis not present

## 2023-01-05 MED ORDER — POLYETHYLENE GLYCOL 3350 17 G PO PACK: 17.0000 g | PACK | Freq: Two times a day (BID) | ORAL | Status: AC

## 2023-01-05 MED ORDER — SORBITOL 70 % SOLN
60.0000 mL | Freq: Once | Status: AC
  Administered 2023-01-05: 60 mL via ORAL
  Filled 2023-01-05: qty 60

## 2023-01-05 MED ORDER — POLYETHYLENE GLYCOL 3350 17 G PO PACK: 17.0000 g | PACK | Freq: Two times a day (BID) | ORAL | Status: DC

## 2023-01-05 NOTE — Progress Notes (Addendum)
[facility] PROGRESS NOTE    Progress Note  Danielle [name]  WUJ:[id number] DOB: 02/21/1946 DOA: 12/25/2022 PCP: [place], [name], MD     Brief Narrative:   Danielle [name] is an 77 y.o. female past medical history significant for essential hypertension, hyperlipidemia systolic heart failure, paroxysmal atrial fibrillation on Eliquis presented to ED for shortness of breath and hypoxia found to be in sepsis probably due to pneumonia, there was also concern for PE VQ scan was done on 12/27/2022 that confirms a PE  Assessment/Plan:   Severe sepsis possibly due to pneumonia: Empirically on antibiotics blood cultures have been negative till date. Completed course of antibiotics in house. Mechanical fall/left knee pain/osteoarthritis: Fell on 12/31/2022 slid down to the ground no significant trauma. X-ray on films so severe degenerative disease will need to follow-up with orthopedic surgery as an outpatient.  Acute respiratory failure with hypoxia likely due to PE/bilateral DVTs: Initially required nonrebreather now has been weaned to room air.  Acute PE/bilateral DVTs: D-dimer greater than 20, VQ scan confirm PE lower extremity Doppler was positive for DVT. Now on Eliquis. Repeated 2D echo showed very poor acoustic window.  Elevated troponins: Likely due to PE denies any chest pain.  Acute kidney injury chronic kidney see stage IIIb: With a baseline creatinine of around 1.6 on admission 3.1 now resolved.  Nausea vomiting and diarrhea: CT scan of the abdomen pelvis showed no acute findings now resolved.  Essential hypertension: Continue Coreg and Aldactone.  Paroxysmal atrial fibrillation: Patient is currently on Coreg and Eliquis and outpatient.  Chronic systolic heart failure: Continue Coreg and Aldactone 2D echo showed newly reduced EF of 45% will need follow-up with cardiology as an outpatient. Continue losartan.  Hypothyroidism: Continue  Synthroid.  Thrombocytopenia, Follow-up PCP as an outpatient.  History of bioprosthetic aortic valve: 2016 noted.  Morbid obesity: Noted    DVT prophylaxis: Eliquis Family Communication:none Status is: Inpatient Remains inpatient appropriate because: Acute PE and pneumonia    Code Status:     Code Status Orders  (From admission, onward)           Start     Ordered   12/25/22 1030  Do not attempt resuscitation (DNR) Pre-Arrest Interventions Desired  (Code Status)  Continuous       Question Answer Comment  If pulseless and not breathing No CPR or chest compressions.   In Pre-Arrest Conditions (Patient Has Pulse and Is Breathing) May intubate, use advanced airway interventions and cardioversion/ACLS medications if appropriate or indicated. May transfer to ICU.   Consent: Discussion documented in EHR or advanced directives reviewed      12/25/22 1029           Code Status History     Date Active Date Inactive Code Status Order ID Comments User Context   12/25/2022 0827 12/25/2022 1029 Full Code [id number]  Clydie Braun, MD ED   11/17/2020 0531 11/17/2020 1526 Full Code [id number]  [name], MD Inpatient   03/10/2020 0832 03/10/2020 1612 Full Code [id number]  [name], MD Inpatient   12/19/2019 1541 12/22/2019 1918 DNR [id number]  [name], MD ED   12/19/2019 1502 12/19/2019 1540 Full Code [id number]  [name], MD ED   08/28/2018 1641 09/01/2018 2115 DNR [id number]  [name], MD Inpatient   01/14/2017 2013 01/15/2017 1657 Full Code [id number]  [name], MD Inpatient   12/10/2015 1434 12/15/2015 2129 DNR [id number]  [name], MD ED   12/10/2015 1327  12/10/2015 1434 Full Code [id number]  [name], MD ED   12/03/2015 1847 12/09/2015 2040 Full Code [id number]  [name] Inpatient   11/28/2015 2033 12/03/2015 1847 Full Code [id number]  [name], PA-C Inpatient   08/19/2015 2030 08/21/2015 2005 Full Code  [id number]  Rama, [name], MD Inpatient         IV Access:   Peripheral IV   Procedures and diagnostic studies:   No results found.   Medical Consultants:   None.   Subjective:    Danielle [name] relates her pain is not controlled.  Objective:    Vitals:   01/05/23 0400 01/05/23 0500 01/05/23 0600 01/05/23 0734  BP:    105/75  Pulse:    100  Resp: (!) 22 (!) 26 20 19   Temp:    98.4 F (36.9 C)  TempSrc:    Oral  SpO2: 96% 97% 100% 100%  Weight:      Height:       SpO2: 100 % O2 Flow Rate (L/min): 2 L/min FiO2 (%): 50 %   Intake/Output Summary (Last 24 hours) at 01/05/2023 0957 Last data filed at 01/05/2023 0255 Gross per 24 hour  Intake 240 ml  Output 600 ml  Net -360 ml   Filed Weights   01/03/23 0328 01/04/23 0329 01/05/23 0353  Weight: 110.6 kg 109.7 kg 108.7 kg    Exam: General exam: In no acute distress. Respiratory system: Good air movement and clear to auscultation. Cardiovascular system: S1 & S2 heard, RRR. No JVD. Gastrointestinal system: Abdomen is nondistended, soft and nontender.  Extremities: No pedal edema. Skin: No rashes, lesions or ulcers Psychiatry: Judgement and insight appear normal. Mood & affect appropriate.    Data Reviewed:    Labs: Basic Metabolic Panel: Recent Labs  Lab 12/30/22 0545 12/31/22 0633 01/02/23 1418 01/03/23 0603  NA 136 137 136 136  K 4.0 4.1 4.3 4.7  CL 109 111 102 106  CO2 18* 23 23 22   GLUCOSE 82 95 90 90  BUN 17 15 21 21   CREATININE 1.30* 1.23* 1.59* 1.54*  CALCIUM 9.0 9.0 9.4 9.1  MG 1.8 1.9 2.1  --   PHOS 3.4 3.6  --   --    GFR Estimated Creatinine Clearance: 39.4 mL/min (A) (by C-G formula based on SCr of 1.54 mg/dL (H)). Liver Function Tests: Recent Labs  Lab 12/30/22 0545 12/31/22 0633  AST 20 15  ALT 56* 42  ALKPHOS 63 65  BILITOT 0.8 0.6  PROT 5.4* 5.3*  ALBUMIN 2.5* 2.5*   No results for input(s): "LIPASE", "AMYLASE" in the last 168 hours. No  results for input(s): "AMMONIA" in the last 168 hours. Coagulation profile No results for input(s): "INR", "PROTIME" in the last 168 hours. COVID-19 Labs  No results for input(s): "DDIMER", "FERRITIN", "LDH", "CRP" in the last 72 hours.  Lab Results  Component Value Date   SARSCOV2NAA NEGATIVE 12/25/2022   SARSCOV2NAA NEGATIVE 03/07/2020   SARSCOV2NAA POSITIVE (A) 12/19/2019   SARSCOV2NAA NOT DETECTED 08/28/2018    CBC: Recent Labs  Lab 12/30/22 0545 12/31/22 0633  WBC 7.5 7.6  NEUTROABS 4.9 5.0  HGB 12.5 12.4  HCT 37.0 37.2  MCV 94.1 95.1  PLT 82* 92*   Cardiac Enzymes: No results for input(s): "CKTOTAL", "CKMB", "CKMBINDEX", "TROPONINI" in the last 168 hours. BNP (last 3 results) No results for input(s): "PROBNP" in the last 8760 hours. CBG: No results for input(s): "GLUCAP" in the last 168 hours.  D-Dimer: No results for input(s): "DDIMER" in the last 72 hours. Hgb A1c: No results for input(s): "HGBA1C" in the last 72 hours. Lipid Profile: No results for input(s): "CHOL", "HDL", "LDLCALC", "TRIG", "CHOLHDL", "LDLDIRECT" in the last 72 hours. Thyroid function studies: No results for input(s): "TSH", "T4TOTAL", "T3FREE", "THYROIDAB" in the last 72 hours.  Invalid input(s): "FREET3" Anemia work up: No results for input(s): "VITAMINB12", "FOLATE", "FERRITIN", "TIBC", "IRON", "RETICCTPCT" in the last 72 hours. Sepsis Labs: Recent Labs  Lab 12/30/22 0545 12/31/22 0633  WBC 7.5 7.6   Microbiology No results found for this or any previous visit (from the past 240 hour(s)).   Medications:    apixaban  5 mg Oral BID   arformoterol  15 mcg Nebulization BID   budesonide (PULMICORT) nebulizer solution  0.5 mg Nebulization BID   carvedilol  25 mg Oral BID WC   lidocaine  2 patch Transdermal Q24H   losartan  25 mg Oral Daily   polyethylene glycol  17 g Oral Daily   sodium chloride flush  3 mL Intravenous Q12H   Continuous Infusions:    LOS: 11 days   Marinda Elk  [facility]  01/05/2023, 9:57 AM

## 2023-01-05 NOTE — Progress Notes (Signed)
Physical Therapy Treatment Patient Details Name: Danielle [name] MRN: [id number] DOB: Apr 26, 1945 Today's Date: 01/05/2023   History of Present Illness Pt is 77 year old presented to [facility] on  12/25/22 for acute respiratory failure with hypoxia and copd exacerbation. VQ scan confirms PE 10/28, age indeterminate B LE DVTs. PMH - anxiety, breast cancer, COPD, CHF, CAD, HTN, and CVA with residual RUE weakness, Lt hip fx with IM nail    PT Comments  Pt admitted with above diagnosis. Pt was able to ambulate and incr distance with RW and min assist of 2 with chair follow for safety as pt continues to have left knee pain.  Will continue to progress pt as able.  Pt currently with functional limitations due to the deficits listed below (see PT Problem List). Pt will benefit from acute skilled PT to increase their independence and safety with mobility to allow discharge.       If plan is discharge home, recommend the following: A little help with walking and/or transfers;A little help with bathing/dressing/bathroom;Assistance with cooking/housework;Assist for transportation   Can travel by private vehicle     No  Equipment Recommendations  None recommended by PT    Recommendations for Other Services       Precautions / Restrictions Precautions Precautions: Fall Precaution Comments: watch SpO2. HR and SOB Restrictions Weight Bearing Restrictions: No     Mobility  Bed Mobility               General bed mobility comments: received in recliner    Transfers Overall transfer level: Needs assistance Equipment used: Rolling walker (2 wheels) Transfers: Sit to/from Stand Sit to Stand: Min assist           General transfer comment: Needed cues for hand placement and incr time to reach to RW as well as min assist.    Ambulation/Gait Ambulation/Gait assistance: Min assist Gait Distance (Feet): 55 Feet Assistive device: Rolling walker (2 wheels) Gait Pattern/deviations:  Step-to pattern, Decreased step length - right, Decreased step length - left, Trunk flexed Gait velocity: decr Gait velocity interpretation: <1.31 ft/sec, indicative of household ambulator   General Gait Details: assist for steadying and maintaining posture, cueing for chest up continuously and staying within bounds of RW.  Continues to need chair follow.   Stairs             Wheelchair Mobility     Tilt Bed    Modified Rankin (Stroke Patients Only)       Balance Overall balance assessment: Needs assistance Sitting-balance support: No upper extremity supported, Feet supported Sitting balance-Leahy Scale: Fair     Standing balance support: Bilateral upper extremity supported, During functional activity, Reliant on assistive device for balance Standing balance-Leahy Scale: Poor Standing balance comment: Reliant on RW throughout standing activity with forward lean                            Cognition Arousal: Alert Behavior During Therapy: WFL for tasks assessed/performed Overall Cognitive Status: No family/caregiver present to determine baseline cognitive functioning                                 General Comments: possible memory deficits, benefits from problem solving cues        Exercises General Exercises - Lower Extremity Ankle Circles/Pumps: AROM, Both, 10 reps, Supine Long Arc Quad: AROM, Both, 15 reps,  Seated Hip Flexion/Marching: AROM, Both, 10 reps, Seated    General Comments General comments (skin integrity, edema, etc.): VSS      Pertinent Vitals/Pain Pain Assessment Pain Assessment: Faces Faces Pain Scale: Hurts even more Pain Location: L knee Pain Descriptors / Indicators: Discomfort, Grimacing, Guarding Pain Intervention(s): Limited activity within patient's tolerance, Monitored during session, Repositioned    Home Living                          Prior Function            PT Goals (current goals  can now be found in the care plan section) Acute Rehab PT Goals Patient Stated Goal: return home Progress towards PT goals: Progressing toward goals    Frequency    Min 1X/week      PT Plan      Co-evaluation              AM-PAC PT "6 Clicks" Mobility   Outcome Measure  Help needed turning from your back to your side while in a flat bed without using bedrails?: A Little Help needed moving from lying on your back to sitting on the side of a flat bed without using bedrails?: A Little Help needed moving to and from a bed to a chair (including a wheelchair)?: A Lot Help needed standing up from a chair using your arms (e.g., wheelchair or bedside chair)?: A Little Help needed to walk in hospital room?: Total Help needed climbing 3-5 steps with a railing? : A Lot 6 Click Score: 14    End of Session Equipment Utilized During Treatment: Gait belt Activity Tolerance: Patient tolerated treatment well Patient left: in chair;with call bell/phone within reach;with chair alarm set Nurse Communication: Mobility status PT Visit Diagnosis: Other abnormalities of gait and mobility (R26.89);Muscle weakness (generalized) (M62.81);History of falling (Z91.81)     Time: 4098-1191 PT Time Calculation (min) (ACUTE ONLY): 18 min  Charges:    $Gait Training: 8-22 mins PT General Charges $$ ACUTE PT VISIT: 1 Visit                     Jandiel Magallanes M,PT Acute Rehab Services [phone]    Bevelyn Buckles 01/05/2023, 1:16 PM

## 2023-01-05 NOTE — Plan of Care (Signed)

## 2023-01-05 NOTE — TOC Progression Note (Addendum)
Transition of Care [facility]) - Progression Note    Patient Details  Name: Danielle [name] MRN: [id number] Date of Birth: [birth year]  [facility] CM/SW Contact  Delilah Shan, LCSWA Phone Number: 01/05/2023, 11:12 AM  Clinical Narrative:     CSW spoke with Bjorn Loser with [facility] who informed CSW that they can no longer offer SNF bed for patient. Facility informed CSW they are unable to get anywhere with the insurance authorization and previously were told that insurance is out of scope.CSW informed team. [facility] program is following. CSW updated patient.  Expected Discharge Plan: Skilled Nursing Facility Barriers to Discharge: Continued Medical Work up  Expected Discharge Plan and Services In-house Referral: Clinical Social Work Discharge Planning Services: CM Consult Post Acute Care Choice: Skilled Nursing Facility Living arrangements for the past 2 months: Single Family Home                           HH Arranged: PT, OT HH Agency: [facility] Date [facility] Agency Contacted: 12/27/22 Time HH Agency Contacted: 1423 Representative spoke with at [facility] Agency: Kandee Keen   Social Determinants of Health (SDOH) Interventions SDOH Screenings   Food Insecurity: No Food Insecurity (12/25/2022)  Housing: Low Risk  (12/25/2022)  Transportation Needs: No Transportation Needs (12/25/2022)  Utilities: Not At Risk (12/25/2022)  Alcohol Screen: Low Risk  (02/01/2022)  Depression (PHQ2-9): Low Risk  (06/04/2022)  Recent Concern: Depression (PHQ2-9) - Medium Risk (04/09/2022)  Financial Resource Strain: Low Risk  (02/01/2022)  Recent Concern: Financial Resource Strain - Medium Risk (01/25/2022)  Physical Activity: Inactive (02/01/2022)  Social Connections: Socially Integrated (02/01/2022)  Stress: No Stress Concern Present (02/01/2022)  Tobacco Use: Medium Risk (12/27/2022)    Readmission Risk Interventions     No data to display

## 2023-01-06 DIAGNOSIS — J9601 Acute respiratory failure with hypoxia: Secondary | ICD-10-CM | POA: Diagnosis not present

## 2023-01-06 NOTE — Progress Notes (Signed)
Mobility Specialist Progress Note:   01/06/23 1515  Mobility  Activity Transferred from bed to chair  Level of Assistance Minimal assist, patient does 75% or more  Assistive Device Front wheel walker  Distance Ambulated (ft) 3 ft  Activity Response Tolerated fair (anxious since fall)  Mobility Referral Yes  $Mobility charge 1 Mobility  Mobility Specialist Start Time (ACUTE ONLY) 1515  Mobility Specialist Stop Time (ACUTE ONLY) 1532  Mobility Specialist Time Calculation (min) (ACUTE ONLY) 17 min   Pt agreeable to mobility session. Required only minA to get EOB and to stand and pivot to chair. Pt required cues for flexed posture, which she was able to correct. Left in chair with all needs met.   Addison Lank Mobility Specialist Please contact via SecureChat or  Rehab office at [phone]

## 2023-01-06 NOTE — Progress Notes (Signed)
[facility] PROGRESS NOTE    Progress Note  Danielle [name]  NWG:[id number] DOB: [birth year] DOA: 12/25/2022 PCP: [place], [name], MD     Brief Narrative:   Danielle [name] is an 77 y.o. female past medical history significant for essential hypertension, hyperlipidemia systolic heart failure, paroxysmal atrial fibrillation on Eliquis presented to ED for shortness of breath and hypoxia found to be in sepsis probably due to pneumonia, there was also concern for PE VQ scan was done on 12/27/2022 that confirms a PE  Assessment/Plan:   Severe sepsis possibly due to pneumonia: Empirically on antibiotics blood cultures have been negative till date. Completed course of antibiotics in house. Mechanical fall/left knee pain/osteoarthritis: Fell on 12/31/2022 slid down to the ground no significant trauma. X-ray on films so severe degenerative disease will need to follow-up with orthopedic surgery as an outpatient. Awaiting insurance authorization for skilled nursing solidity placement.  Acute respiratory failure with hypoxia likely due to PE/bilateral DVTs: Initially required nonrebreather now has been weaned to room air.  Acute PE/bilateral DVTs: D-dimer greater than 20, VQ scan confirm PE lower extremity Doppler was positive for DVT. Now on Eliquis.  Elevated troponins: Likely due to PE denies any chest pain.  Acute kidney injury chronic kidney see stage IIIb: With a baseline creatinine of around 1.6 on admission 3.1 now resolved.  Nausea vomiting and diarrhea: CT scan of the abdomen pelvis showed no acute findings now resolved.  Essential hypertension: Continue Coreg and Aldactone.  Paroxysmal atrial fibrillation: Patient is currently on Coreg and Eliquis and outpatient.  Chronic systolic heart failure: Continue Coreg and Aldactone 2D echo showed newly reduced EF of 45% will need follow-up with cardiology as an outpatient. Continue  losartan.  Hypothyroidism: Continue Synthroid.  Thrombocytopenia, Follow-up PCP as an outpatient.  History of bioprosthetic aortic valve: 2016 noted.  Morbid obesity: Noted    DVT prophylaxis: Eliquis Family Communication:none Status is: Inpatient Remains inpatient appropriate because: Acute PE and pneumonia    Code Status:     Code Status Orders  (From admission, onward)           Start     Ordered   12/25/22 1030  Do not attempt resuscitation (DNR) Pre-Arrest Interventions Desired  (Code Status)  Continuous       Question Answer Comment  If pulseless and not breathing No CPR or chest compressions.   In Pre-Arrest Conditions (Patient Has Pulse and Is Breathing) May intubate, use advanced airway interventions and cardioversion/ACLS medications if appropriate or indicated. May transfer to ICU.   Consent: Discussion documented in EHR or advanced directives reviewed      12/25/22 1029           Code Status History     Date Active Date Inactive Code Status Order ID Comments User Context   12/25/2022 0827 12/25/2022 1029 Full Code [id number]  Clydie Braun, MD ED   11/17/2020 0531 11/17/2020 1526 Full Code [id number]  [name], MD Inpatient   03/10/2020 0832 03/10/2020 1612 Full Code [id number]  [name], MD Inpatient   12/19/2019 1541 12/22/2019 1918 DNR [id number]  [name], MD ED   12/19/2019 1502 12/19/2019 1540 Full Code [id number]  [name], MD ED   08/28/2018 1641 09/01/2018 2115 DNR [id number]  [name], MD Inpatient   01/14/2017 2013 01/15/2017 1657 Full Code [id number]  [name], MD Inpatient   12/10/2015 1434 12/15/2015 2129 DNR [id number]  [name], MD ED   12/10/2015 1327  12/10/2015 1434 Full Code [id number]  [name], MD ED   12/03/2015 1847 12/09/2015 2040 Full Code [id number]  [name] Inpatient   11/28/2015 2033 12/03/2015 1847 Full Code [id number]  [name], PA-C Inpatient    08/19/2015 2030 08/21/2015 2005 Full Code [id number]  Rama, [name], MD Inpatient         IV Access:   Peripheral IV   Procedures and diagnostic studies:   No results found.   Medical Consultants:   None.   Subjective:    Danielle [name] has no new complaints today.  Objective:    Vitals:   01/05/23 1943 01/06/23 0613 01/06/23 0745 01/06/23 0749  BP: 119/81 114/67 112/69   Pulse: 82 77 85 81  Resp: 18 20 20    Temp: 97.9 F (36.6 C) 98.2 F (36.8 C) 98 F (36.7 C)   TempSrc: Oral Oral Oral   SpO2: 100%  100% 98%  Weight:  110.3 kg    Height:       SpO2: 98 % O2 Flow Rate (L/min): 2 L/min FiO2 (%): 50 %   Intake/Output Summary (Last 24 hours) at 01/06/2023 1019 Last data filed at 01/06/2023 0618 Gross per 24 hour  Intake --  Output 750 ml  Net -750 ml   Filed Weights   01/04/23 0329 01/05/23 0353 01/06/23 0613  Weight: 109.7 kg 108.7 kg 110.3 kg    Exam: General exam: In no acute distress. Respiratory system: Good air movement and clear to auscultation. Cardiovascular system: S1 & S2 heard, RRR. No JVD. Gastrointestinal system: Abdomen is nondistended, soft and nontender.  Extremities: No pedal edema. Skin: No rashes, lesions or ulcers Psychiatry: Judgement and insight appear normal. Mood & affect appropriate.  Data Reviewed:    Labs: Basic Metabolic Panel: Recent Labs  Lab 12/31/22 0633 01/02/23 1418 01/03/23 0603  NA 137 136 136  K 4.1 4.3 4.7  CL 111 102 106  CO2 23 23 22   GLUCOSE 95 90 90  BUN 15 21 21   CREATININE 1.23* 1.59* 1.54*  CALCIUM 9.0 9.4 9.1  MG 1.9 2.1  --   PHOS 3.6  --   --    GFR Estimated Creatinine Clearance: 39.8 mL/min (A) (by C-G formula based on SCr of 1.54 mg/dL (H)). Liver Function Tests: Recent Labs  Lab 12/31/22 0633  AST 15  ALT 42  ALKPHOS 65  BILITOT 0.6  PROT 5.3*  ALBUMIN 2.5*   No results for input(s): "LIPASE", "AMYLASE" in the last 168 hours. No results for input(s):  "AMMONIA" in the last 168 hours. Coagulation profile No results for input(s): "INR", "PROTIME" in the last 168 hours. COVID-19 Labs  No results for input(s): "DDIMER", "FERRITIN", "LDH", "CRP" in the last 72 hours.  Lab Results  Component Value Date   SARSCOV2NAA NEGATIVE 12/25/2022   SARSCOV2NAA NEGATIVE 03/07/2020   SARSCOV2NAA POSITIVE (A) 12/19/2019   SARSCOV2NAA NOT DETECTED 08/28/2018    CBC: Recent Labs  Lab 12/31/22 0633  WBC 7.6  NEUTROABS 5.0  HGB 12.4  HCT 37.2  MCV 95.1  PLT 92*   Cardiac Enzymes: No results for input(s): "CKTOTAL", "CKMB", "CKMBINDEX", "TROPONINI" in the last 168 hours. BNP (last 3 results) No results for input(s): "PROBNP" in the last 8760 hours. CBG: No results for input(s): "GLUCAP" in the last 168 hours. D-Dimer: No results for input(s): "DDIMER" in the last 72 hours. Hgb A1c: No results for input(s): "HGBA1C" in the last 72 hours. Lipid Profile: No results  for input(s): "CHOL", "HDL", "LDLCALC", "TRIG", "CHOLHDL", "LDLDIRECT" in the last 72 hours. Thyroid function studies: No results for input(s): "TSH", "T4TOTAL", "T3FREE", "THYROIDAB" in the last 72 hours.  Invalid input(s): "FREET3" Anemia work up: No results for input(s): "VITAMINB12", "FOLATE", "FERRITIN", "TIBC", "IRON", "RETICCTPCT" in the last 72 hours. Sepsis Labs: Recent Labs  Lab 12/31/22 0633  WBC 7.6   Microbiology No results found for this or any previous visit (from the past 240 hour(s)).   Medications:    apixaban  5 mg Oral BID   arformoterol  15 mcg Nebulization BID   budesonide (PULMICORT) nebulizer solution  0.5 mg Nebulization BID   carvedilol  25 mg Oral BID WC   lidocaine  2 patch Transdermal Q24H   losartan  25 mg Oral Daily   polyethylene glycol  17 g Oral BID   sodium chloride flush  3 mL Intravenous Q12H   Continuous Infusions:    LOS: 12 days   Marinda Elk  [facility]  01/06/2023, 10:19 AM

## 2023-01-06 NOTE — Plan of Care (Signed)

## 2023-01-06 NOTE — TOC Progression Note (Signed)
Transition of Care [facility]) - Progression Note    Patient Details  Name: Danielle [name] MRN: [id number] Date of Birth: October 02, 1945  Transition of Care [facility]) CM/SW Contact  Delilah Shan, LCSWA Phone Number: 01/06/2023, 10:58 AM  Clinical Narrative:     [facility] is following patient. CSW informed MD.TOC will continue to follow.  Expected Discharge Plan: Skilled Nursing Facility Barriers to Discharge: Continued Medical Work up  Expected Discharge Plan and Services In-house Referral: Clinical Social Work Discharge Planning Services: CM Consult Post Acute Care Choice: Skilled Nursing Facility Living arrangements for the past 2 months: Single Family Home                           HH Arranged: PT, OT HH Agency: [facility] Date [facility] Agency Contacted: 12/27/22 Time HH Agency Contacted: 1423 Representative spoke with at [facility] Agency: Kandee Keen   Social Determinants of Health (SDOH) Interventions SDOH Screenings   Food Insecurity: No Food Insecurity (12/25/2022)  Housing: Low Risk  (12/25/2022)  Transportation Needs: No Transportation Needs (12/25/2022)  Utilities: Not At Risk (12/25/2022)  Alcohol Screen: Low Risk  (02/01/2022)  Depression (PHQ2-9): Low Risk  (06/04/2022)  Recent Concern: Depression (PHQ2-9) - Medium Risk (04/09/2022)  Financial Resource Strain: Low Risk  (02/01/2022)  Recent Concern: Financial Resource Strain - Medium Risk (01/25/2022)  Physical Activity: Inactive (02/01/2022)  Social Connections: Socially Integrated (02/01/2022)  Stress: No Stress Concern Present (02/01/2022)  Tobacco Use: Medium Risk (12/27/2022)    Readmission Risk Interventions     No data to display

## 2023-01-07 DIAGNOSIS — J9601 Acute respiratory failure with hypoxia: Secondary | ICD-10-CM | POA: Diagnosis not present

## 2023-01-07 NOTE — Progress Notes (Signed)
Occupational Therapy Treatment Patient Details Name: Genessa Vessels MRN: [id number] DOB: [birth year] Today's Date: 01/07/2023   History of present illness Pt is 77 year old presented to [facility] on  12/25/22 for acute respiratory failure with hypoxia and copd exacerbation. VQ scan confirms PE 10/28, age indeterminate B LE DVTs. PMH - anxiety, breast cancer, COPD, CHF, CAD, HTN, and CVA with residual RUE weakness, Lt hip fx with IM nail   OT comments  OT session focused on B UE therapeutic exercises (details below) to increase strength and activity tolerance for carryover to functional tasks. Pt participated well in session and is making progress toward goals. VSS on RA throughout session. Pt will benefit from continued acute skilled OT services to address deficits outlined below and increase safety and independence with functional tasks. Post acute discharge, pt will benefit from intensive inpatient skilled rehab services < 3 hours per day to maximize rehab potential.       If plan is discharge home, recommend the following:  A little help with walking and/or transfers;A lot of help with bathing/dressing/bathroom;Assistance with cooking/housework;Assist for transportation;Help with stairs or ramp for entrance   Equipment Recommendations  Other (comment) (defer to next level of care)    Recommendations for Other Services      Precautions / Restrictions Precautions Precautions: Fall Precaution Comments: watch SpO2. HR and SOB Restrictions Weight Bearing Restrictions: No       Mobility Bed Mobility Overal bed mobility: Needs Assistance             General bed mobility comments: Pt sitting in recliner at beginning and end of session.    Transfers Overall transfer level: Needs assistance                 General transfer comment: Pt declined funcitonal transfers and OOB activity secondary to being up with PT and NT not long before OT session.     Balance  Overall balance assessment: Needs assistance Sitting-balance support: No upper extremity supported, Feet supported Sitting balance-Leahy Scale: Fair                                     ADL either performed or assessed with clinical judgement   ADL Overall ADL's : Needs assistance/impaired                                       General ADL Comments: Pt declined participation in ADLs this session secondary to having been up with PT and NT not long before OT session.    Extremity/Trunk Assessment Upper Extremity Assessment Upper Extremity Assessment: Right hand dominant;Generalized weakness   Lower Extremity Assessment Lower Extremity Assessment: Defer to PT evaluation        Vision       Perception     Praxis      Cognition Arousal: Alert Behavior During Therapy: WFL for tasks assessed/performed Overall Cognitive Status: No family/caregiver present to determine baseline cognitive functioning                                 General Comments: Able to follow 1 step instructions consistently and 2 step instructions with increased time. Pleasant throughout session.        Exercises Exercises: General Upper Extremity, Other exercises  General Exercises - Upper Extremity Shoulder Flexion: AROM, Strengthening, Both, Other reps (comment), Seated (limited B AROM; sitting in recliner; 2 sets of 10 with occasional rest breaks) Shoulder Extension: AROM, Strengthening, Both, Other reps (comment), Seated (limited B AROM; sitting in recliner; 2 sets of 10 with occasional rest breaks) Shoulder ABduction: AROM, Both, Strengthening, Other reps (comment), Seated (sitting in recliner; 2 sets of 10 with occasional rest breaks) Shoulder ADduction: AROM, Both, Strengthening, Other reps (comment), Seated (sitting in recliner; 2 sets of 10 with occasional rest breaks) Elbow Flexion: AROM, Both, Strengthening, Other reps (comment), Seated (sitting in  recliner; 2 sets of 10 with occasional rest breaks) Elbow Extension: AROM, Both, Strengthening, Other reps (comment), Seated (sitting in recliner; 2 sets of 10 with occasional rest breaks) Other Exercises Other Exercises: Tricep pull back; Both; Yellow theraband; sitting in recliner; 2 sets of 10 with occasional rest breaks    Shoulder Instructions       General Comments VSS on RA throughout session    Pertinent Vitals/ Pain       Pain Assessment Pain Assessment: Faces Faces Pain Scale: Hurts a little bit (at rest) Pain Location: L knee Pain Descriptors / Indicators: Discomfort, Grimacing, Guarding Pain Intervention(s): Limited activity within patient's tolerance, Monitored during session, Repositioned  Home Living                                          Prior Functioning/Environment              Frequency  Min 1X/week        Progress Toward Goals  OT Goals(current goals can now be found in the care plan section)  Progress towards OT goals: Progressing toward goals  Acute Rehab OT Goals Patient Stated Goal: to return home with her husband  Plan      Co-evaluation                 AM-PAC OT "6 Clicks" Daily Activity     Outcome Measure   Help from another person eating meals?: None Help from another person taking care of personal grooming?: A Little Help from another person toileting, which includes using toliet, bedpan, or urinal?: A Lot Help from another person bathing (including washing, rinsing, drying)?: A Little Help from another person to put on and taking off regular upper body clothing?: A Little Help from another person to put on and taking off regular lower body clothing?: A Lot 6 Click Score: 17    End of Session    OT Visit Diagnosis: Muscle weakness (generalized) (M62.81);Other (comment) (decreased activity tolerance)   Activity Tolerance Patient tolerated treatment well;Patient limited by fatigue   Patient Left  in chair;with call bell/phone within reach;with chair alarm set   Nurse Communication Mobility status        Time: 1236-1300 OT Time Calculation (min): 24 min  Charges: OT General Charges $OT Visit: 1 Visit OT Treatments $Therapeutic Exercise: 23-37 mins  Laraina Sulton "Orson Eva., OTR/L, MA Acute Rehab [phone]   Lendon Colonel 01/07/2023, 2:00 PM

## 2023-01-07 NOTE — Progress Notes (Signed)
[facility] PROGRESS NOTE    Progress Note  Danielle [name]  ZOX:[id number] DOB: 09/25/1945 DOA: 12/25/2022 PCP: [place], [name], MD     Brief Narrative:   Danielle [name] is an 77 y.o. female past medical history significant for essential hypertension, hyperlipidemia systolic heart failure, paroxysmal atrial fibrillation on Eliquis presented to ED for shortness of breath and hypoxia found to be in sepsis probably due to pneumonia, there was also concern for PE VQ scan was done on 12/27/2022 that confirms a PE  Assessment/Plan:   Severe sepsis possibly due to pneumonia: Empirically on antibiotics blood cultures have been negative till date. Completed course of antibiotics in house. Mechanical fall/left knee pain/osteoarthritis: Fell on 12/31/2022 slid down to the ground no significant trauma. X-ray on films so severe degenerative disease will need to follow-up with orthopedic surgery as an outpatient. Awaiting insurance authorization for skilled nursing placement.  Acute respiratory failure with hypoxia likely due to PE/bilateral DVTs: Initially required nonrebreather now has been weaned to room air.  Acute PE/bilateral DVTs: D-dimer greater than 20, VQ scan confirm PE lower extremity Doppler was positive for DVT. Now on Eliquis.  Elevated troponins: Likely due to PE denies any chest pain.  Acute kidney injury chronic kidney see stage IIIb: With a baseline creatinine of around 1.6 on admission 3.1, now back to baseline.  Nausea vomiting and diarrhea: CT scan of the abdomen pelvis showed no acute findings now resolved.  Essential hypertension: Continue Coreg and Aldactone.  Paroxysmal atrial fibrillation: Patient is currently on Coreg and Eliquis and outpatient.  Chronic systolic heart failure: Continue Coreg and Aldactone 2D echo showed newly reduced EF of 45% will need follow-up with cardiology as an outpatient. Continue  losartan.  Hypothyroidism: Continue Synthroid.  Thrombocytopenia, Follow-up PCP as an outpatient.  History of bioprosthetic aortic valve: 2016 noted.  Morbid obesity: Noted    DVT prophylaxis: Eliquis Family Communication:none Status is: Inpatient Remains inpatient appropriate because: Acute PE and pneumonia    Code Status:     Code Status Orders  (From admission, onward)           Start     Ordered   12/25/22 1030  Do not attempt resuscitation (DNR) Pre-Arrest Interventions Desired  (Code Status)  Continuous       Question Answer Comment  If pulseless and not breathing No CPR or chest compressions.   In Pre-Arrest Conditions (Patient Has Pulse and Is Breathing) May intubate, use advanced airway interventions and cardioversion/ACLS medications if appropriate or indicated. May transfer to ICU.   Consent: Discussion documented in EHR or advanced directives reviewed      12/25/22 1029           Code Status History     Date Active Date Inactive Code Status Order ID Comments User Context   12/25/2022 0827 12/25/2022 1029 Full Code [id number]  Clydie Braun, MD ED   11/17/2020 0531 11/17/2020 1526 Full Code [id number]  [name], MD Inpatient   03/10/2020 0832 03/10/2020 1612 Full Code [id number]  [name], MD Inpatient   12/19/2019 1541 12/22/2019 1918 DNR [id number]  [name], MD ED   12/19/2019 1502 12/19/2019 1540 Full Code [id number]  [name], MD ED   08/28/2018 1641 09/01/2018 2115 DNR [id number]  [name], MD Inpatient   01/14/2017 2013 01/15/2017 1657 Full Code [id number]  [name], MD Inpatient   12/10/2015 1434 12/15/2015 2129 DNR [id number]  [name], MD ED   12/10/2015  1327 12/10/2015 1434 Full Code [id number]  [name], MD ED   12/03/2015 1847 12/09/2015 2040 Full Code [id number]  [name] Inpatient   11/28/2015 2033 12/03/2015 1847 Full Code [id number]  [name], PA-C Inpatient    08/19/2015 2030 08/21/2015 2005 Full Code [id number]  Rama, [name], MD Inpatient         IV Access:   Peripheral IV   Procedures and diagnostic studies:   No results found.   Medical Consultants:   None.   Subjective:    Danielle [name] has no new complaints today.  Objective:    Vitals:   01/06/23 1522 01/06/23 2003 01/07/23 0322 01/07/23 0719  BP: 124/86 98/64 108/75 114/70  Pulse: 78 76 72 77  Resp: 16 20 18 17   Temp: 97.9 F (36.6 C) 98.4 F (36.9 C) 98.6 F (37 C) 97.7 F (36.5 C)  TempSrc: Oral Oral Oral Oral  SpO2: 99%     Weight:   107.4 kg   Height:       SpO2: 99 % O2 Flow Rate (L/min): 2 L/min FiO2 (%): 50 %   Intake/Output Summary (Last 24 hours) at 01/07/2023 0827 Last data filed at 01/07/2023 0324 Gross per 24 hour  Intake --  Output 150 ml  Net -150 ml   Filed Weights   01/05/23 0353 01/06/23 0613 01/07/23 0322  Weight: 108.7 kg 110.3 kg 107.4 kg    Exam: General exam: In no acute distress. Respiratory system: Good air movement and clear to auscultation. Cardiovascular system: S1 & S2 heard, RRR. No JVD. Gastrointestinal system: Abdomen is nondistended, soft and nontender.  Extremities: No pedal edema. Skin: No rashes, lesions or ulcers Psychiatry: Judgement and insight appear normal. Mood & affect appropriate.  Data Reviewed:    Labs: Basic Metabolic Panel: Recent Labs  Lab 01/02/23 1418 01/03/23 0603  NA 136 136  K 4.3 4.7  CL 102 106  CO2 23 22  GLUCOSE 90 90  BUN 21 21  CREATININE 1.59* 1.54*  CALCIUM 9.4 9.1  MG 2.1  --    GFR Estimated Creatinine Clearance: 39.2 mL/min (A) (by C-G formula based on SCr of 1.54 mg/dL (H)). Liver Function Tests: No results for input(s): "AST", "ALT", "ALKPHOS", "BILITOT", "PROT", "ALBUMIN" in the last 168 hours.  No results for input(s): "LIPASE", "AMYLASE" in the last 168 hours. No results for input(s): "AMMONIA" in the last 168 hours. Coagulation  profile No results for input(s): "INR", "PROTIME" in the last 168 hours. COVID-19 Labs  No results for input(s): "DDIMER", "FERRITIN", "LDH", "CRP" in the last 72 hours.  Lab Results  Component Value Date   SARSCOV2NAA NEGATIVE 12/25/2022   SARSCOV2NAA NEGATIVE 03/07/2020   SARSCOV2NAA POSITIVE (A) 12/19/2019   SARSCOV2NAA NOT DETECTED 08/28/2018    CBC: No results for input(s): "WBC", "NEUTROABS", "HGB", "HCT", "MCV", "PLT" in the last 168 hours.  Cardiac Enzymes: No results for input(s): "CKTOTAL", "CKMB", "CKMBINDEX", "TROPONINI" in the last 168 hours. BNP (last 3 results) No results for input(s): "PROBNP" in the last 8760 hours. CBG: No results for input(s): "GLUCAP" in the last 168 hours. D-Dimer: No results for input(s): "DDIMER" in the last 72 hours. Hgb A1c: No results for input(s): "HGBA1C" in the last 72 hours. Lipid Profile: No results for input(s): "CHOL", "HDL", "LDLCALC", "TRIG", "CHOLHDL", "LDLDIRECT" in the last 72 hours. Thyroid function studies: No results for input(s): "TSH", "T4TOTAL", "T3FREE", "THYROIDAB" in the last 72 hours.  Invalid input(s): "FREET3" Anemia work up:  No results for input(s): "VITAMINB12", "FOLATE", "FERRITIN", "TIBC", "IRON", "RETICCTPCT" in the last 72 hours. Sepsis Labs: No results for input(s): "PROCALCITON", "WBC", "LATICACIDVEN" in the last 168 hours.  Microbiology No results found for this or any previous visit (from the past 240 hour(s)).   Medications:    apixaban  5 mg Oral BID   arformoterol  15 mcg Nebulization BID   budesonide (PULMICORT) nebulizer solution  0.5 mg Nebulization BID   carvedilol  25 mg Oral BID WC   lidocaine  2 patch Transdermal Q24H   losartan  25 mg Oral Daily   polyethylene glycol  17 g Oral BID   sodium chloride flush  3 mL Intravenous Q12H   Continuous Infusions:    LOS: 13 days   Marinda Elk  [facility]  01/07/2023, 8:27 AM

## 2023-01-07 NOTE — Progress Notes (Signed)
Physical Therapy Treatment Patient Details Name: Danielle [name] MRN: [id number] DOB: June 11, 1945 Today's Date: 01/07/2023   History of Present Illness Pt is 77 year old presented to [facility] on  12/25/22 for acute respiratory failure with hypoxia and copd exacerbation. VQ scan confirms PE 10/28, age indeterminate B LE DVTs. PMH - anxiety, breast cancer, COPD, CHF, CAD, HTN, and CVA with residual RUE weakness, Lt hip fx with IM nail    PT Comments  Pt admitted with above diagnosis. Pt met 0/4 goals due to slow progress as pt has had issues with dizziness and anxiety.  Goals revised.  Pt is making progress its just more slow than anticipated.  Requiring less assist today and incr gait distance. Will continue to follow acutely.  Pt currently with functional limitations due to the deficits listed below (see PT Problem List). Pt will benefit from acute skilled PT to increase their independence and safety with mobility to allow discharge.       If plan is discharge home, recommend the following: A little help with walking and/or transfers;A little help with bathing/dressing/bathroom;Assistance with cooking/housework;Assist for transportation   Can travel by private vehicle     No  Equipment Recommendations  None recommended by PT    Recommendations for Other Services       Precautions / Restrictions Precautions Precautions: Fall Precaution Comments: watch SpO2. HR and SOB Restrictions Weight Bearing Restrictions: No     Mobility  Bed Mobility               General bed mobility comments: received in recliner    Transfers Overall transfer level: Needs assistance Equipment used: Rolling walker (2 wheels) Transfers: Sit to/from Stand Sit to Stand: Mod assist           General transfer comment: Needed cues for hand placement and incr time to reach to RW as well as mod assist to rise first time from low recliner however second attempt min assist only.     Ambulation/Gait Ambulation/Gait assistance: Min assist, +2 safety/equipment Gait Distance (Feet): 40 Feet (45 feet x 2) Assistive device: Rolling walker (2 wheels) Gait Pattern/deviations: Step-to pattern, Decreased step length - right, Decreased step length - left, Trunk flexed Gait velocity: decr Gait velocity interpretation: <1.31 ft/sec, indicative of household ambulator   General Gait Details: assist for steadying and maintaining posture, cueing for chest up continuously and staying within bounds of RW.  Continues to need chair follow. JHad to sit due to c/o dizziness. VSS.  Had one seated rest break and then ambulate back to room.   Stairs             Wheelchair Mobility     Tilt Bed    Modified Rankin (Stroke Patients Only)       Balance Overall balance assessment: Needs assistance Sitting-balance support: No upper extremity supported, Feet supported Sitting balance-Leahy Scale: Fair     Standing balance support: Bilateral upper extremity supported, During functional activity, Reliant on assistive device for balance Standing balance-Leahy Scale: Poor Standing balance comment: Reliant on RW throughout standing activity with forward lean                            Cognition Arousal: Alert Behavior During Therapy: WFL for tasks assessed/performed Overall Cognitive Status: No family/caregiver present to determine baseline cognitive functioning  General Comments: possible memory deficits, benefits from problem solving cues        Exercises General Exercises - Lower Extremity Ankle Circles/Pumps: AROM, Both, 10 reps, Supine Long Arc Quad: AROM, Both, 15 reps, Seated Heel Slides: AROM, Right, 5 reps, Supine    General Comments General comments (skin integrity, edema, etc.): VSS      Pertinent Vitals/Pain Pain Assessment Pain Assessment: Faces Faces Pain Scale: Hurts even more Pain Location: L  knee Pain Descriptors / Indicators: Discomfort, Grimacing, Guarding Pain Intervention(s): Limited activity within patient's tolerance, Monitored during session, Repositioned    Home Living                          Prior Function            PT Goals (current goals can now be found in the care plan section) Acute Rehab PT Goals Patient Stated Goal: return home PT Goal Formulation: With patient Time For Goal Achievement: 01/21/23 Potential to Achieve Goals: Good Progress towards PT goals: Progressing toward goals    Frequency    Min 1X/week      PT Plan      Co-evaluation              AM-PAC PT "6 Clicks" Mobility   Outcome Measure  Help needed turning from your back to your side while in a flat bed without using bedrails?: A Little Help needed moving from lying on your back to sitting on the side of a flat bed without using bedrails?: A Little Help needed moving to and from a bed to a chair (including a wheelchair)?: A Lot Help needed standing up from a chair using your arms (e.g., wheelchair or bedside chair)?: A Little Help needed to walk in hospital room?: Total Help needed climbing 3-5 steps with a railing? : A Lot 6 Click Score: 14    End of Session Equipment Utilized During Treatment: Gait belt Activity Tolerance: Patient tolerated treatment well Patient left: in chair;with call bell/phone within reach;with chair alarm set Nurse Communication: Mobility status PT Visit Diagnosis: Other abnormalities of gait and mobility (R26.89);Muscle weakness (generalized) (M62.81);History of falling (Z91.81)     Time: 1610-9604 PT Time Calculation (min) (ACUTE ONLY): 25 min  Charges:    $Gait Training: 8-22 mins $Therapeutic Exercise: 8-22 mins PT General Charges $$ ACUTE PT VISIT: 1 Visit                     Mikaeel Petrow M,PT Acute Rehab Services [phone]    Bevelyn Buckles 01/07/2023, 1:55 PM

## 2023-01-08 ENCOUNTER — Inpatient Hospital Stay (HOSPITAL_COMMUNITY): Payer: Medicare HMO

## 2023-01-08 DIAGNOSIS — J9601 Acute respiratory failure with hypoxia: Secondary | ICD-10-CM | POA: Diagnosis not present

## 2023-01-08 MED ORDER — OXYCODONE HCL 5 MG PO TABS
5.0000 mg | ORAL_TABLET | Freq: Four times a day (QID) | ORAL | Status: DC | PRN
  Administered 2023-01-08 – 2023-01-18 (×13): 5 mg via ORAL
  Filled 2023-01-08 (×13): qty 1

## 2023-01-08 NOTE — Progress Notes (Addendum)
Mobility Specialist Progress Note:   01/08/23 1312  Mobility  Activity Ambulated with assistance in room  Level of Assistance Minimal assist, patient does 75% or more (+2)  Assistive Device Front wheel walker  Distance Ambulated (ft) 30 ft  Activity Response Tolerated well  Mobility Referral Yes  $Mobility charge 1 Mobility  Mobility Specialist Start Time (ACUTE ONLY) 1245  Mobility Specialist Stop Time (ACUTE ONLY) 1300  Mobility Specialist Time Calculation (min) (ACUTE ONLY) 15 min   Pre Mobility: 78 HR During Mobility: 96 HR  Post Mobility: 82 HR   Pt received in bed, agreeable to mobility. Pt found to have soiled gown and bed. Assisted gown change with NT. Pt needing minA to stand. Upon standing pt c/o L. Knee pain. Pt able to ambulate to door from bedside. Verbal cues required to remind pt to stay within RW and to keep upright posture. Pt denied any other complaints. Pt left in chair with NT still present in room.   Leory Plowman  Mobility Specialist Please contact via [employer] office at [phone]

## 2023-01-08 NOTE — Progress Notes (Signed)
[facility] PROGRESS NOTE    Progress Note  Danielle [name]  ZOX:[id number] DOB: 1945-07-14 DOA: 12/25/2022 PCP: [place], [name], MD     Brief Narrative:   Danielle [name] is an 77 y.o. female past medical history significant for essential hypertension, hyperlipidemia systolic heart failure, paroxysmal atrial fibrillation on Eliquis presented to ED for shortness of breath and hypoxia found to be in sepsis probably due to pneumonia, there was also concern for PE VQ scan was done on 12/27/2022 that confirms a PE  Assessment/Plan:   Severe sepsis possibly due to pneumonia: Empirically on antibiotics blood cultures have been negative till date. Completed course of antibiotics in house. Mechanical fall/left knee pain/osteoarthritis: Fell on 12/31/2022 slid down to the ground no significant trauma. X-ray on films so severe degenerative disease will need to follow-up with orthopedic surgery as an outpatient. Awaiting insurance authorization for skilled nursing placement.  Acute respiratory failure with hypoxia likely due to PE/bilateral DVTs: Initially required nonrebreather now has been weaned to room air.  Acute PE/bilateral DVTs: D-dimer greater than 20, VQ scan confirm PE lower extremity Doppler was positive for DVT. Now on Eliquis.  Elevated troponins: Likely due to PE denies any chest pain.  Acute kidney injury chronic kidney see stage IIIb: With a baseline creatinine of around 1.6 on admission 3.1, now back to baseline.  Nausea vomiting and diarrhea: CT scan of the abdomen pelvis showed no acute findings now resolved.  Essential hypertension: Continue Coreg and Aldactone.  Paroxysmal atrial fibrillation: Patient is currently on Coreg and Eliquis and outpatient.  Chronic systolic heart failure: Continue Coreg and Aldactone 2D echo showed newly reduced EF of 45% will need follow-up with cardiology as an outpatient. Continue  losartan.  Hypothyroidism: Continue Synthroid.  Thrombocytopenia, Follow-up PCP as an outpatient.  History of bioprosthetic aortic valve: 2016 noted.  Morbid obesity: Noted    DVT prophylaxis: Eliquis Family Communication:none Status is: Inpatient Remains inpatient appropriate because: Acute PE and pneumonia    Code Status:     Code Status Orders  (From admission, onward)           Start     Ordered   12/25/22 1030  Do not attempt resuscitation (DNR) Pre-Arrest Interventions Desired  (Code Status)  Continuous       Question Answer Comment  If pulseless and not breathing No CPR or chest compressions.   In Pre-Arrest Conditions (Patient Has Pulse and Is Breathing) May intubate, use advanced airway interventions and cardioversion/ACLS medications if appropriate or indicated. May transfer to ICU.   Consent: Discussion documented in EHR or advanced directives reviewed      12/25/22 1029           Code Status History     Date Active Date Inactive Code Status Order ID Comments User Context   12/25/2022 0827 12/25/2022 1029 Full Code [id number]  Clydie Braun, MD ED   11/17/2020 0531 11/17/2020 1526 Full Code [id number]  [name], MD Inpatient   03/10/2020 0832 03/10/2020 1612 Full Code [id number]  [name], MD Inpatient   12/19/2019 1541 12/22/2019 1918 DNR [id number]  [name], MD ED   12/19/2019 1502 12/19/2019 1540 Full Code [id number]  [name], MD ED   08/28/2018 1641 09/01/2018 2115 DNR [id number]  [name], MD Inpatient   01/14/2017 2013 01/15/2017 1657 Full Code [id number]  [name], MD Inpatient   12/10/2015 1434 12/15/2015 2129 DNR [id number]  [name], MD ED   12/10/2015  1327 12/10/2015 1434 Full Code [id number]  [name], MD ED   12/03/2015 1847 12/09/2015 2040 Full Code [id number]  [name] Inpatient   11/28/2015 2033 12/03/2015 1847 Full Code [id number]  [name], PA-C Inpatient    08/19/2015 2030 08/21/2015 2005 Full Code [id number]  Rama, [name], MD Inpatient         IV Access:   Peripheral IV   Procedures and diagnostic studies:   No results found.   Medical Consultants:   None.   Subjective:    Danielle [name] has no new complaints today.  Objective:    Vitals:   01/07/23 1708 01/07/23 1941 01/08/23 0414 01/08/23 0750  BP: 120/64 95/68 116/75 133/77  Pulse: 90 81 64 81  Resp: (!) 22 20 18 18   Temp: (!) 97.4 F (36.3 C) 98 F (36.7 C) 98.6 F (37 C) 98.8 F (37.1 C)  TempSrc: Oral Oral Oral Oral  SpO2: 98% 96% 97% 99%  Weight:   109.5 kg   Height:       SpO2: 99 % O2 Flow Rate (L/min): 2 L/min FiO2 (%): 50 %   Intake/Output Summary (Last 24 hours) at 01/08/2023 0913 Last data filed at 01/08/2023 0547 Gross per 24 hour  Intake --  Output 850 ml  Net -850 ml   Filed Weights   01/06/23 0613 01/07/23 0322 01/08/23 0414  Weight: 110.3 kg 107.4 kg 109.5 kg    Exam: General exam: In no acute distress. Respiratory system: Good air movement and clear to auscultation. Cardiovascular system: S1 & S2 heard, RRR. No JVD. Gastrointestinal system: Abdomen is nondistended, soft and nontender.  Extremities: No pedal edema. Skin: No rashes, lesions or ulcers Psychiatry: Judgement and insight appear normal. Mood & affect appropriate.  Data Reviewed:    Labs: Basic Metabolic Panel: Recent Labs  Lab 01/02/23 1418 01/03/23 0603  NA 136 136  K 4.3 4.7  CL 102 106  CO2 23 22  GLUCOSE 90 90  BUN 21 21  CREATININE 1.59* 1.54*  CALCIUM 9.4 9.1  MG 2.1  --    GFR Estimated Creatinine Clearance: 39.6 mL/min (A) (by C-G formula based on SCr of 1.54 mg/dL (H)). Liver Function Tests: No results for input(s): "AST", "ALT", "ALKPHOS", "BILITOT", "PROT", "ALBUMIN" in the last 168 hours.  No results for input(s): "LIPASE", "AMYLASE" in the last 168 hours. No results for input(s): "AMMONIA" in the last 168  hours. Coagulation profile No results for input(s): "INR", "PROTIME" in the last 168 hours. COVID-19 Labs  No results for input(s): "DDIMER", "FERRITIN", "LDH", "CRP" in the last 72 hours.  Lab Results  Component Value Date   SARSCOV2NAA NEGATIVE 12/25/2022   SARSCOV2NAA NEGATIVE 03/07/2020   SARSCOV2NAA POSITIVE (A) 12/19/2019   SARSCOV2NAA NOT DETECTED 08/28/2018    CBC: No results for input(s): "WBC", "NEUTROABS", "HGB", "HCT", "MCV", "PLT" in the last 168 hours.  Cardiac Enzymes: No results for input(s): "CKTOTAL", "CKMB", "CKMBINDEX", "TROPONINI" in the last 168 hours. BNP (last 3 results) No results for input(s): "PROBNP" in the last 8760 hours. CBG: No results for input(s): "GLUCAP" in the last 168 hours. D-Dimer: No results for input(s): "DDIMER" in the last 72 hours. Hgb A1c: No results for input(s): "HGBA1C" in the last 72 hours. Lipid Profile: No results for input(s): "CHOL", "HDL", "LDLCALC", "TRIG", "CHOLHDL", "LDLDIRECT" in the last 72 hours. Thyroid function studies: No results for input(s): "TSH", "T4TOTAL", "T3FREE", "THYROIDAB" in the last 72 hours.  Invalid input(s): "FREET3" Anemia  work up: No results for input(s): "VITAMINB12", "FOLATE", "FERRITIN", "TIBC", "IRON", "RETICCTPCT" in the last 72 hours. Sepsis Labs: No results for input(s): "PROCALCITON", "WBC", "LATICACIDVEN" in the last 168 hours.  Microbiology No results found for this or any previous visit (from the past 240 hour(s)).   Medications:    apixaban  5 mg Oral BID   arformoterol  15 mcg Nebulization BID   budesonide (PULMICORT) nebulizer solution  0.5 mg Nebulization BID   carvedilol  25 mg Oral BID WC   lidocaine  2 patch Transdermal Q24H   losartan  25 mg Oral Daily   sodium chloride flush  3 mL Intravenous Q12H   Continuous Infusions:    LOS: 14 days   Marinda Elk  [facility]  01/08/2023, 9:13 AM

## 2023-01-09 DIAGNOSIS — J9601 Acute respiratory failure with hypoxia: Secondary | ICD-10-CM | POA: Diagnosis not present

## 2023-01-09 DIAGNOSIS — N179 Acute kidney failure, unspecified: Secondary | ICD-10-CM | POA: Diagnosis not present

## 2023-01-09 LAB — CBC WITH DIFFERENTIAL/PLATELET
Abs Immature Granulocytes: 0.04 10*3/uL (ref 0.00–0.07)
Basophils Absolute: 0 10*3/uL (ref 0.0–0.1)
Basophils Relative: 1 %
Eosinophils Absolute: 0.1 10*3/uL (ref 0.0–0.5)
Eosinophils Relative: 1 %
HCT: 36.2 % (ref 36.0–46.0)
Hemoglobin: 11.5 g/dL — ABNORMAL LOW (ref 12.0–15.0)
Immature Granulocytes: 1 %
Lymphocytes Relative: 10 %
Lymphs Abs: 0.7 10*3/uL (ref 0.7–4.0)
MCH: 30.7 pg (ref 26.0–34.0)
MCHC: 31.8 g/dL (ref 30.0–36.0)
MCV: 96.5 fL (ref 80.0–100.0)
Monocytes Absolute: 0.7 10*3/uL (ref 0.1–1.0)
Monocytes Relative: 11 %
Neutro Abs: 5.1 10*3/uL (ref 1.7–7.7)
Neutrophils Relative %: 76 %
Platelets: 87 10*3/uL — ABNORMAL LOW (ref 150–400)
RBC: 3.75 MIL/uL — ABNORMAL LOW (ref 3.87–5.11)
RDW: 13.8 % (ref 11.5–15.5)
WBC: 6.7 10*3/uL (ref 4.0–10.5)
nRBC: 0 % (ref 0.0–0.2)

## 2023-01-09 LAB — URINALYSIS, ROUTINE W REFLEX MICROSCOPIC
Bilirubin Urine: NEGATIVE
Glucose, UA: NEGATIVE mg/dL
Hgb urine dipstick: NEGATIVE
Ketones, ur: NEGATIVE mg/dL
Leukocytes,Ua: NEGATIVE
Nitrite: NEGATIVE
Protein, ur: NEGATIVE mg/dL
Specific Gravity, Urine: 1.019 (ref 1.005–1.030)
pH: 5 (ref 5.0–8.0)

## 2023-01-09 LAB — RESPIRATORY PANEL BY PCR

## 2023-01-09 LAB — SARS CORONAVIRUS 2 BY RT PCR: SARS Coronavirus 2 by RT PCR: POSITIVE — AB

## 2023-01-09 MED ORDER — GUAIFENESIN-DM 100-10 MG/5ML PO SYRP
5.0000 mL | ORAL_SOLUTION | ORAL | Status: DC | PRN
  Administered 2023-01-09 – 2023-01-14 (×4): 5 mL via ORAL
  Filled 2023-01-09 (×4): qty 5

## 2023-01-09 NOTE — Progress Notes (Signed)
[facility] PROGRESS NOTE    Progress Note  Danielle [name]  UXL:[id number] DOB: 1945/12/30 DOA: 12/25/2022 PCP: [place], [name], MD     Brief Narrative:   Danielle [name] is an 77 y.o. female past medical history significant for essential hypertension, hyperlipidemia systolic heart failure, paroxysmal atrial fibrillation on Eliquis presented to ED for shortness of breath and hypoxia found to be in sepsis probably due to pneumonia, there was also concern for PE VQ scan was done on 12/27/2022 that confirms a PE  Assessment/Plan:   Severe sepsis possibly due to pneumonia: Empirically on antibiotics blood cultures have been negative till date. Completed course of antibiotics in house. Mechanical fall/left knee pain/osteoarthritis: Fell on 12/31/2022 slid down to the ground no significant trauma. X-ray on films so severe degenerative disease will need to follow-up with orthopedic surgery as an outpatient. Awaiting insurance authorization for skilled nursing placement.  Acute respiratory failure with hypoxia likely due to PE/bilateral DVTs: Initially required nonrebreather now has been weaned to room air.  Acute PE/bilateral DVTs: D-dimer greater than 20, VQ scan confirm PE lower extremity Doppler was positive for DVT. Now on Eliquis.  Elevated troponins: Likely due to PE denies any chest pain.  Acute kidney injury chronic kidney see stage IIIb: With a baseline creatinine of around 1.6 on admission 3.1, now back to baseline.  Nausea vomiting and diarrhea: CT scan of the abdomen pelvis showed no acute findings now resolved.  Essential hypertension: Continue Coreg and Aldactone.  Paroxysmal atrial fibrillation: Patient is currently on Coreg and Eliquis and outpatient.  Chronic systolic heart failure: Continue Coreg and Aldactone 2D echo showed newly reduced EF of 45% will need follow-up with cardiology as an outpatient. Continue  losartan.  Hypothyroidism: Continue Synthroid.  Thrombocytopenia, Follow-up PCP as an outpatient.  History of bioprosthetic aortic valve: 2016 noted.  Morbid obesity: Noted  New onset of fevers: Spiked a temp of 102.0 on 01/08/2023, has no leukocytosis. She relates some cough this morning chest x-ray showed no acute findings, UA showed noes evidence of infection. Blood cultures have been sent negative till date. Check respiratory panel, influenza PCR and SARS-CoV-2.    DVT prophylaxis: Eliquis Family Communication:none Status is: Inpatient Remains inpatient appropriate because: Acute PE and pneumonia    Code Status:     Code Status Orders  (From admission, onward)           Start     Ordered   12/25/22 1030  Do not attempt resuscitation (DNR) Pre-Arrest Interventions Desired  (Code Status)  Continuous       Question Answer Comment  If pulseless and not breathing No CPR or chest compressions.   In Pre-Arrest Conditions (Patient Has Pulse and Is Breathing) May intubate, use advanced airway interventions and cardioversion/ACLS medications if appropriate or indicated. May transfer to ICU.   Consent: Discussion documented in EHR or advanced directives reviewed      12/25/22 1029           Code Status History     Date Active Date Inactive Code Status Order ID Comments User Context   12/25/2022 0827 12/25/2022 1029 Full Code [id number]  Clydie Braun, MD ED   11/17/2020 0531 11/17/2020 1526 Full Code [id number]  [name], MD Inpatient   03/10/2020 0832 03/10/2020 1612 Full Code [id number]  [name], MD Inpatient   12/19/2019 1541 12/22/2019 1918 DNR [id number]  [name], MD ED   12/19/2019 1502 12/19/2019 1540 Full Code [id number]  Pahwani,  Kasandra Knudsen, MD ED   08/28/2018 1641 09/01/2018 2115 DNR [id number]  [name], MD Inpatient   01/14/2017 2013 01/15/2017 1657 Full Code [id number]  [name], MD Inpatient   12/10/2015 1434  12/15/2015 2129 DNR [id number]  [name], MD ED   12/10/2015 1327 12/10/2015 1434 Full Code [id number]  [name], MD ED   12/03/2015 1847 12/09/2015 2040 Full Code [id number]  Sharlene Dory, PA-C Inpatient   11/28/2015 2033 12/03/2015 1847 Full Code [id number]  [name], PA-C Inpatient   08/19/2015 2030 08/21/2015 2005 Full Code [id number]  Rama, [name], MD Inpatient         IV Access:   Peripheral IV   Procedures and diagnostic studies:   DG CHEST PORT 1 VIEW  Result Date: 01/08/2023 CLINICAL DATA:  Fever EXAM: PORTABLE CHEST 1 VIEW COMPARISON:  Chest x-ray 12/19/2022 FINDINGS: Staple lines are seen in the bilateral upper lobes, unchanged. There is no focal lung consolidation. Costophrenic angles are clear. No pneumothorax. Aorta is tortuous. The heart is mildly enlarged, unchanged. There are atherosclerotic calcifications of the aorta. There surgical clips in the left axilla. IMPRESSION: 1. No active disease. 2. Mild cardiomegaly. Electronically Signed   By: Darliss Cheney M.D.   On: 01/08/2023 22:30     Medical Consultants:   None.   Subjective:    Artasia Pulliam-McEachean continue cough since yesterday.  Objective:    Vitals:   01/08/23 2120 01/08/23 2121 01/09/23 0330 01/09/23 0717  BP:   105/74   Pulse:   71 86  Resp: (!) 28 (!) 25 17   Temp:   99.9 F (37.7 C)   TempSrc:   Oral   SpO2: 95% 96% 92%   Weight:   107.8 kg   Height:       SpO2: 92 % O2 Flow Rate (L/min): 2 L/min FiO2 (%): 50 %   Intake/Output Summary (Last 24 hours) at 01/09/2023 0851 Last data filed at 01/09/2023 0500 Gross per 24 hour  Intake 100 ml  Output 1200 ml  Net -1100 ml   Filed Weights   01/07/23 0322 01/08/23 0414 01/09/23 0330  Weight: 107.4 kg 109.5 kg 107.8 kg    Exam: General exam: In no acute distress. Respiratory system: Good air movement and clear to auscultation. Cardiovascular system: S1 & S2 heard, RRR. No JVD. Gastrointestinal system:  Abdomen is nondistended, soft and nontender.  Central nervous system: Alert and oriented. No focal neurological deficits. Extremities: No pedal edema. Skin: No rashes, lesions or ulcers Psychiatry: Judgement and insight appear normal. Mood & affect appropriate. Data Reviewed:    Labs: Basic Metabolic Panel: Recent Labs  Lab 01/02/23 1418 01/03/23 0603  NA 136 136  K 4.3 4.7  CL 102 106  CO2 23 22  GLUCOSE 90 90  BUN 21 21  CREATININE 1.59* 1.54*  CALCIUM 9.4 9.1  MG 2.1  --    GFR Estimated Creatinine Clearance: 39.3 mL/min (A) (by C-G formula based on SCr of 1.54 mg/dL (H)). Liver Function Tests: No results for input(s): "AST", "ALT", "ALKPHOS", "BILITOT", "PROT", "ALBUMIN" in the last 168 hours.  No results for input(s): "LIPASE", "AMYLASE" in the last 168 hours. No results for input(s): "AMMONIA" in the last 168 hours. Coagulation profile No results for input(s): "INR", "PROTIME" in the last 168 hours. COVID-19 Labs  No results for input(s): "DDIMER", "FERRITIN", "LDH", "CRP" in the last 72 hours.  Lab Results  Component Value Date   SARSCOV2NAA NEGATIVE 12/25/2022  SARSCOV2NAA NEGATIVE 03/07/2020   SARSCOV2NAA POSITIVE (A) 12/19/2019   SARSCOV2NAA NOT DETECTED 08/28/2018    CBC: Recent Labs  Lab 01/09/23 0426  WBC 6.7  NEUTROABS 5.1  HGB 11.5*  HCT 36.2  MCV 96.5  PLT 87*    Cardiac Enzymes: No results for input(s): "CKTOTAL", "CKMB", "CKMBINDEX", "TROPONINI" in the last 168 hours. BNP (last 3 results) No results for input(s): "PROBNP" in the last 8760 hours. CBG: No results for input(s): "GLUCAP" in the last 168 hours. D-Dimer: No results for input(s): "DDIMER" in the last 72 hours. Hgb A1c: No results for input(s): "HGBA1C" in the last 72 hours. Lipid Profile: No results for input(s): "CHOL", "HDL", "LDLCALC", "TRIG", "CHOLHDL", "LDLDIRECT" in the last 72 hours. Thyroid function studies: No results for input(s): "TSH", "T4TOTAL", "T3FREE",  "THYROIDAB" in the last 72 hours.  Invalid input(s): "FREET3" Anemia work up: No results for input(s): "VITAMINB12", "FOLATE", "FERRITIN", "TIBC", "IRON", "RETICCTPCT" in the last 72 hours. Sepsis Labs: Recent Labs  Lab 01/09/23 0426  WBC 6.7    Microbiology Recent Results (from the past 240 hour(s))  Culture, blood (Routine X 2) w Reflex to ID Panel     Status: None (Preliminary result)   Collection Time: 01/08/23  7:44 PM   Specimen: BLOOD RIGHT HAND  Result Value Ref Range Status   Specimen Description BLOOD RIGHT HAND  Final   Special Requests   Final    BOTTLES DRAWN AEROBIC AND ANAEROBIC Blood Culture results may not be optimal due to an excessive volume of blood received in culture bottles   Culture   Final    NO GROWTH < 12 HOURS Performed at [facility], [address] [place], [place] [address]    Report Status PENDING  Incomplete  Culture, blood (Routine X 2) w Reflex to ID Panel     Status: None (Preliminary result)   Collection Time: 01/08/23  7:47 PM   Specimen: BLOOD RIGHT HAND  Result Value Ref Range Status   Specimen Description BLOOD RIGHT HAND  Final   Special Requests   Final    BOTTLES DRAWN AEROBIC AND ANAEROBIC Blood Culture adequate volume   Culture   Final    NO GROWTH < 12 HOURS Performed at [facility], [address] [place], [place] [address]    Report Status PENDING  Incomplete     Medications:    apixaban  5 mg Oral BID   arformoterol  15 mcg Nebulization BID   budesonide (PULMICORT) nebulizer solution  0.5 mg Nebulization BID   carvedilol  25 mg Oral BID WC   lidocaine  2 patch Transdermal Q24H   losartan  25 mg Oral Daily   sodium chloride flush  3 mL Intravenous Q12H   Continuous Infusions:    LOS: 15 days   Marinda Elk  [facility]  01/09/2023, 8:51 AM

## 2023-01-09 NOTE — Progress Notes (Signed)
Mobility Specialist Progress Note:   01/09/23 1349  Mobility  Activity Ambulated with assistance in room  Level of Assistance Maximum assist, patient does 25-49% (+2)  Assistive Device Front wheel walker  Distance Ambulated (ft) 30 ft  Activity Response Tolerated well  Mobility Referral Yes  $Mobility charge 1 Mobility  Mobility Specialist Start Time (ACUTE ONLY) 1321  Mobility Specialist Stop Time (ACUTE ONLY) 1345  Mobility Specialist Time Calculation (min) (ACUTE ONLY) 24 min   Pt received in chair, agreeable to mobility. Pt needing maxA+2 to stand after 2x failed attempts. Upon standing pt c/o L. Knee pain but pt motivated to ambulate. Seated rest break d/t L knee pain and weakness. Pt still needing verbal cues for posture and to stay within RW. After recovery pt was able to stand from lower chair level after 2x failed attempts.  Pt denied any dizziness during session. Pt returned to chair with NT still present in room.  Leory Plowman  Mobility Specialist Please contact via [employer] office at [phone]

## 2023-01-09 NOTE — Plan of Care (Signed)
  Problem: Health Behavior/Discharge Planning: Goal: Ability to manage health-related needs will improve Outcome: Progressing   Problem: Clinical Measurements: Goal: Ability to maintain clinical measurements within normal limits will improve Outcome: Progressing Goal: Respiratory complications will improve Outcome: Progressing Goal: Cardiovascular complication will be avoided Outcome: Progressing   Problem: Nutrition: Goal: Adequate nutrition will be maintained Outcome: Progressing   Problem: Pain Management: Goal: General experience of comfort will improve Outcome: Progressing   Problem: Safety: Goal: Ability to remain free from injury will improve Outcome: Progressing

## 2023-01-10 DIAGNOSIS — N179 Acute kidney failure, unspecified: Secondary | ICD-10-CM | POA: Diagnosis not present

## 2023-01-10 DIAGNOSIS — J9601 Acute respiratory failure with hypoxia: Secondary | ICD-10-CM | POA: Diagnosis not present

## 2023-01-10 NOTE — Progress Notes (Addendum)
Physical Therapy Treatment Patient Details Name: Danielle [name] MRN: [id number] DOB: Jul 08, 1945 Today's Date: 01/10/2023   History of Present Illness Pt is 77 year old presented to [facility] on  12/25/22 for acute respiratory failure with hypoxia and copd exacerbation. VQ scan confirms PE 10/28, age indeterminate B LE DVTs. PMH - anxiety, breast cancer, COPD, CHF, CAD, HTN, and CVA with residual RUE weakness, Lt hip fx with IM nail    PT Comments  Pt with increased L knee pain today limiting mobility. Pt now reporting that she normally uses wheelchair at home and that her husband was helping her but is able to do less and less. Also reports possible "lady who has been coming to help with cleaning" pt reports she could come more often. Pt up in recliner and screams out as L LE is slowly lowered to floor. Once down pt able to participate in BLE AROM exercises, pt reports no increase in pain. Increased grimace and moaning with 2x attempts to come to standing requiring maxAx2. Plan was for ambulation in hallway, but pt with decreased command follow for upright posture and RW management. Understand that pt is having difficulty with receiving continued inpatient follow up therapy, <3 ours/day, but PT continues to recommend at this point. Will continue to work with pt but likely will need to go home at a wheelchair level given L knee pain.      If plan is discharge home, recommend the following: A little help with walking and/or transfers;A little help with bathing/dressing/bathroom;Assistance with cooking/housework;Assist for transportation   Can travel by private vehicle     No  Equipment Recommendations  None recommended by PT       Precautions / Restrictions Precautions Precautions: Fall Precaution Comments: watch SpO2. HR and SOB Restrictions Weight Bearing Restrictions: No     Mobility  Bed Mobility Overal bed mobility: Needs Assistance         Sit to supine: Mod assist    General bed mobility comments: received in recliner, modA for returning L LE to bed    Transfers Overall transfer level: Needs assistance Equipment used: Rolling walker (2 wheels) Transfers: Sit to/from Stand Sit to Stand: Max assist, +2 physical assistance           General transfer comment: pt requires 2 attempts and maxAx2 for power up from low recliner after sitting up for 2.5 hours in recliner    Ambulation/Gait Ambulation/Gait assistance: +2 safety/equipment, Mod assist Gait Distance (Feet): 8 Feet Assistive device: Rolling walker (2 wheels) Gait Pattern/deviations: Step-to pattern, Decreased step length - right, Decreased step length - left, Trunk flexed, Antalgic, Knee flexed in stance - left, Decreased weight shift to left, Decreased stance time - left Gait velocity: decr Gait velocity interpretation: <1.31 ft/sec, indicative of household ambulator   General Gait Details: mod Ax2 for physical assist and maximal multimodal cues for sequencing to get from recliner to bed, able to to self correct flexed posture until she puts weight on L LE, has very poor awareness of sequencing RW use,         Balance Overall balance assessment: Needs assistance Sitting-balance support: No upper extremity supported, Feet supported Sitting balance-Leahy Scale: Fair     Standing balance support: Bilateral upper extremity supported, During functional activity, Reliant on assistive device for balance Standing balance-Leahy Scale: Poor Standing balance comment: Reliant on RW throughout standing activity with forward lean  Cognition Arousal: Alert Behavior During Therapy: WFL for tasks assessed/performed Overall Cognitive Status: No family/caregiver present to determine baseline cognitive functioning                                 General Comments: reports she came into hospital due to fall, and that she gets around her home with  wheelchair due to L knee pain, at Evaluation reported that she got around her home with RW        Exercises General Exercises - Lower Extremity Ankle Circles/Pumps: AROM, Both, 10 reps, Supine Long Arc Quad: AROM, Both, 15 reps, Seated Hip Flexion/Marching: AROM, Both, 10 reps, Seated    General Comments General comments (skin integrity, edema, etc.): VSS on RA      Pertinent Vitals/Pain Pain Assessment Pain Assessment: Faces Faces Pain Scale: Hurts even more Pain Location: L knee Pain Descriptors / Indicators: Discomfort, Grimacing, Guarding Pain Intervention(s): Limited activity within patient's tolerance, Monitored during session, Repositioned     PT Goals (current goals can now be found in the care plan section) Acute Rehab PT Goals Patient Stated Goal: return home PT Goal Formulation: With patient Time For Goal Achievement: 01/21/23 Potential to Achieve Goals: Good Progress towards PT goals: Not progressing toward goals - comment    Frequency    Min 1X/week       AM-PAC PT "6 Clicks" Mobility   Outcome Measure  Help needed turning from your back to your side while in a flat bed without using bedrails?: A Little Help needed moving from lying on your back to sitting on the side of a flat bed without using bedrails?: A Little Help needed moving to and from a bed to a chair (including a wheelchair)?: A Lot Help needed standing up from a chair using your arms (e.g., wheelchair or bedside chair)?: A Little Help needed to walk in hospital room?: Total Help needed climbing 3-5 steps with a railing? : A Lot 6 Click Score: 14    End of Session Equipment Utilized During Treatment: Gait belt Activity Tolerance: Patient tolerated treatment well Patient left: with call bell/phone within reach;with bed alarm set;in bed Nurse Communication: Mobility status PT Visit Diagnosis: Other abnormalities of gait and mobility (R26.89);Muscle weakness (generalized) (M62.81);History  of falling (Z91.81)     Time: 0865-7846 PT Time Calculation (min) (ACUTE ONLY): 32 min  Charges:    $Gait Training: 8-22 mins $Therapeutic Exercise: 8-22 mins PT General Charges $$ ACUTE PT VISIT: 1 Visit                     Lindell Renfrew B. Beverely Risen PT, DPT Acute Rehabilitation Services Please use secure chat or  Call Office [phone]    Elon Alas [facility] 01/10/2023, 12:47 PM

## 2023-01-10 NOTE — Progress Notes (Signed)
[facility] PROGRESS NOTE    Progress Note  Danielle [name]  BJY:[id number] DOB: 11/18/1945 DOA: 12/25/2022 PCP: [place], [name], MD     Brief Narrative:   Danielle [name] is an 77 y.o. female past medical history significant for essential hypertension, hyperlipidemia systolic heart failure, paroxysmal atrial fibrillation on Eliquis presented to ED for shortness of breath and hypoxia found to be in sepsis probably due to pneumonia, there was also concern for PE VQ scan was done on 12/27/2022 that confirms a PE  Assessment/Plan:   Severe sepsis possibly due to pneumonia: Empirically on antibiotics blood cultures have been negative till date. Completed course of antibiotics in house. Mechanical fall/left knee pain/osteoarthritis: Fell on 12/31/2022 slid down to the ground no significant trauma. X-ray on films so severe degenerative disease will need to follow-up with orthopedic surgery as an outpatient. Awaiting insurance authorization for skilled nursing placement.  Acute respiratory failure with hypoxia likely due to PE/bilateral DVTs: Initially required nonrebreather now has been weaned to room air.  Acute PE/bilateral DVTs: D-dimer greater than 20, VQ scan confirm PE lower extremity Doppler was positive for DVT. Now on Eliquis.  Elevated troponins: Likely due to PE denies any chest pain.  Acute kidney injury chronic kidney see stage IIIb: With a baseline creatinine of around 1.6 on admission 3.1, now back to baseline.  Nausea vomiting and diarrhea: CT scan of the abdomen pelvis showed no acute findings now resolved.  Essential hypertension: Continue Coreg and Aldactone.  Paroxysmal atrial fibrillation: Patient is currently on Coreg and Eliquis and outpatient.  Chronic systolic heart failure: Continue Coreg and Aldactone 2D echo showed newly reduced EF of 45% will need follow-up with cardiology as an outpatient. Continue  losartan.  Hypothyroidism: Continue Synthroid.  Thrombocytopenia, Follow-up PCP as an outpatient.  History of bioprosthetic aortic valve: 2016 noted.  Morbid obesity: Noted  COVID-19 infection: Tmax 100.6, she has defervesced and has no leukocytosis no respiratory symptoms blood cultures remain negative to date. Chest x-ray showed no acute findings. Continue to monitor and treat conservatively. She is stable to be discharged to skilled nursing facility   DVT prophylaxis: Eliquis Family Communication:none Status is: Inpatient Remains inpatient appropriate because: Acute PE and pneumonia    Code Status:     Code Status Orders  (From admission, onward)           Start     Ordered   12/25/22 1030  Do not attempt resuscitation (DNR) Pre-Arrest Interventions Desired  (Code Status)  Continuous       Question Answer Comment  If pulseless and not breathing No CPR or chest compressions.   In Pre-Arrest Conditions (Patient Has Pulse and Is Breathing) May intubate, use advanced airway interventions and cardioversion/ACLS medications if appropriate or indicated. May transfer to ICU.   Consent: Discussion documented in EHR or advanced directives reviewed      12/25/22 1029           Code Status History     Date Active Date Inactive Code Status Order ID Comments User Context   12/25/2022 0827 12/25/2022 1029 Full Code [id number]  Clydie Braun, MD ED   11/17/2020 0531 11/17/2020 1526 Full Code [id number]  [name], MD Inpatient   03/10/2020 0832 03/10/2020 1612 Full Code [id number]  [name], MD Inpatient   12/19/2019 1541 12/22/2019 1918 DNR [id number]  [name], MD ED   12/19/2019 1502 12/19/2019 1540 Full Code [id number]  [name], MD ED  08/28/2018 1641 09/01/2018 2115 DNR [id number]  [name], MD Inpatient   01/14/2017 2013 01/15/2017 1657 Full Code [id number]  [name], MD Inpatient   12/10/2015 1434 12/15/2015 2129  DNR [id number]  [name], MD ED   12/10/2015 1327 12/10/2015 1434 Full Code [id number]  [name], MD ED   12/03/2015 1847 12/09/2015 2040 Full Code [id number]  [name] Inpatient   11/28/2015 2033 12/03/2015 1847 Full Code [id number]  [name], PA-C Inpatient   08/19/2015 2030 08/21/2015 2005 Full Code [id number]  Rama, [name], MD Inpatient         IV Access:   Peripheral IV   Procedures and diagnostic studies:   DG CHEST PORT 1 VIEW  Result Date: 01/08/2023 CLINICAL DATA:  Fever EXAM: PORTABLE CHEST 1 VIEW COMPARISON:  Chest x-ray 12/19/2022 FINDINGS: Staple lines are seen in the bilateral upper lobes, unchanged. There is no focal lung consolidation. Costophrenic angles are clear. No pneumothorax. Aorta is tortuous. The heart is mildly enlarged, unchanged. There are atherosclerotic calcifications of the aorta. There surgical clips in the left axilla. IMPRESSION: 1. No active disease. 2. Mild cardiomegaly. Electronically Signed   By: Darliss Cheney M.D.   On: 01/08/2023 22:30     Medical Consultants:   None.   Subjective:    [name] cough has resolved has remained asymptomatic.  Objective:    Vitals:   01/09/23 2200 01/10/23 0356 01/10/23 0751 01/10/23 0803  BP:  100/73  112/76  Pulse:  81  83  Resp:  20  20  Temp: 98.8 F (37.1 C) 98.7 F (37.1 C)  99.5 F (37.5 C)  TempSrc: Oral Oral  Oral  SpO2:  97% 95% 98%  Weight:      Height:       SpO2: 98 % O2 Flow Rate (L/min): 2 L/min FiO2 (%): 50 %   Intake/Output Summary (Last 24 hours) at 01/10/2023 0947 Last data filed at 01/10/2023 0827 Gross per 24 hour  Intake --  Output 620 ml  Net -620 ml   Filed Weights   01/07/23 0322 01/08/23 0414 01/09/23 0330  Weight: 107.4 kg 109.5 kg 107.8 kg    Exam: General exam: In no acute distress. Respiratory system: Good air movement and clear to auscultation. Cardiovascular system: S1 & S2 heard, RRR. No  JVD. Gastrointestinal system: Abdomen is nondistended, soft and nontender.  Extremities: No pedal edema. Skin: No rashes, lesions or ulcers Psychiatry: Judgement and insight appear normal. Mood & affect appropriate. Data Reviewed:    Labs: Basic Metabolic Panel: No results for input(s): "NA", "K", "CL", "CO2", "GLUCOSE", "BUN", "CREATININE", "CALCIUM", "MG", "PHOS" in the last 168 hours.  GFR Estimated Creatinine Clearance: 39.3 mL/min (A) (by C-G formula based on SCr of 1.54 mg/dL (H)). Liver Function Tests: No results for input(s): "AST", "ALT", "ALKPHOS", "BILITOT", "PROT", "ALBUMIN" in the last 168 hours.  No results for input(s): "LIPASE", "AMYLASE" in the last 168 hours. No results for input(s): "AMMONIA" in the last 168 hours. Coagulation profile No results for input(s): "INR", "PROTIME" in the last 168 hours. COVID-19 Labs  No results for input(s): "DDIMER", "FERRITIN", "LDH", "CRP" in the last 72 hours.  Lab Results  Component Value Date   SARSCOV2NAA POSITIVE (A) 01/09/2023   SARSCOV2NAA NEGATIVE 12/25/2022   SARSCOV2NAA NEGATIVE 03/07/2020   SARSCOV2NAA POSITIVE (A) 12/19/2019    CBC: Recent Labs  Lab 01/09/23 0426  WBC 6.7  NEUTROABS 5.1  HGB 11.5*  HCT 36.2  MCV 96.5  PLT 87*    Cardiac Enzymes: No results for input(s): "CKTOTAL", "CKMB", "CKMBINDEX", "TROPONINI" in the last 168 hours. BNP (last 3 results) No results for input(s): "PROBNP" in the last 8760 hours. CBG: No results for input(s): "GLUCAP" in the last 168 hours. D-Dimer: No results for input(s): "DDIMER" in the last 72 hours. Hgb A1c: No results for input(s): "HGBA1C" in the last 72 hours. Lipid Profile: No results for input(s): "CHOL", "HDL", "LDLCALC", "TRIG", "CHOLHDL", "LDLDIRECT" in the last 72 hours. Thyroid function studies: No results for input(s): "TSH", "T4TOTAL", "T3FREE", "THYROIDAB" in the last 72 hours.  Invalid input(s): "FREET3" Anemia work up: No results for  input(s): "VITAMINB12", "FOLATE", "FERRITIN", "TIBC", "IRON", "RETICCTPCT" in the last 72 hours. Sepsis Labs: Recent Labs  Lab 01/09/23 0426  WBC 6.7    Microbiology Recent Results (from the past 240 hour(s))  Culture, blood (Routine X 2) w Reflex to ID Panel     Status: None (Preliminary result)   Collection Time: 01/08/23  7:44 PM   Specimen: BLOOD RIGHT HAND  Result Value Ref Range Status   Specimen Description BLOOD RIGHT HAND  Final   Special Requests   Final    BOTTLES DRAWN AEROBIC AND ANAEROBIC Blood Culture results may not be optimal due to an excessive volume of blood received in culture bottles   Culture   Final    NO GROWTH 2 DAYS Performed at [facility], [address], [place], [place] [address]    Report Status PENDING  Incomplete  Culture, blood (Routine X 2) w Reflex to ID Panel     Status: None (Preliminary result)   Collection Time: 01/08/23  7:47 PM   Specimen: BLOOD RIGHT HAND  Result Value Ref Range Status   Specimen Description BLOOD RIGHT HAND  Final   Special Requests   Final    BOTTLES DRAWN AEROBIC AND ANAEROBIC Blood Culture adequate volume   Culture   Final    NO GROWTH 2 DAYS Performed at [facility], [address], [place], [place] [address]    Report Status PENDING  Incomplete  Respiratory (~20 pathogens) panel by PCR     Status: None   Collection Time: 01/09/23  8:56 AM   Specimen: Nasopharyngeal Swab; Respiratory  Result Value Ref Range Status   Adenovirus NOT DETECTED NOT DETECTED Final   Coronavirus 229E NOT DETECTED NOT DETECTED Final    Comment: (NOTE) The Coronavirus on the Respiratory Panel, DOES NOT test for the novel  Coronavirus (2019 nCoV)    Coronavirus HKU1 NOT DETECTED NOT DETECTED Final   Coronavirus NL63 NOT DETECTED NOT DETECTED Final   Coronavirus OC43 NOT DETECTED NOT DETECTED Final   Metapneumovirus NOT DETECTED NOT DETECTED Final   Rhinovirus / Enterovirus NOT DETECTED NOT DETECTED Final   Influenza  A NOT DETECTED NOT DETECTED Final   Influenza B NOT DETECTED NOT DETECTED Final   Parainfluenza Virus 1 NOT DETECTED NOT DETECTED Final   Parainfluenza Virus 2 NOT DETECTED NOT DETECTED Final   Parainfluenza Virus 3 NOT DETECTED NOT DETECTED Final   Parainfluenza Virus 4 NOT DETECTED NOT DETECTED Final   Respiratory Syncytial Virus NOT DETECTED NOT DETECTED Final   Bordetella pertussis NOT DETECTED NOT DETECTED Final   Bordetella Parapertussis NOT DETECTED NOT DETECTED Final   Chlamydophila pneumoniae NOT DETECTED NOT DETECTED Final   Mycoplasma pneumoniae NOT DETECTED NOT DETECTED Final    Comment: Performed at [facility], [address] [place], [place] [address]  SARS Coronavirus 2 by RT PCR (hospital order, performed in [facility] hospital lab) *cepheid single result test* Anterior Nasal Swab     Status: Abnormal   Collection Time: 01/09/23  8:57 AM   Specimen: Anterior Nasal Swab  Result Value Ref Range Status   SARS Coronavirus 2 by RT PCR POSITIVE (A) NEGATIVE Final    Comment: Performed at [facility], [address], [place], [place] [address]     Medications:    apixaban  5 mg Oral BID   arformoterol  15 mcg Nebulization BID   budesonide (PULMICORT) nebulizer solution  0.5 mg Nebulization BID   carvedilol  25 mg Oral BID WC   lidocaine  2 patch Transdermal Q24H   losartan  25 mg Oral Daily   sodium chloride flush  3 mL Intravenous Q12H   Continuous Infusions:    LOS: 16 days   Marinda Elk  [facility]  01/10/2023, 9:47 AM

## 2023-01-10 NOTE — Plan of Care (Signed)
  Problem: Education: Goal: Knowledge of General Education information will improve Description: Including pain rating scale, medication(s)/side effects and non-pharmacologic comfort measures Outcome: Progressing   Problem: Health Behavior/Discharge Planning: Goal: Ability to manage health-related needs will improve Outcome: Progressing   Problem: Clinical Measurements: Goal: Ability to maintain clinical measurements within normal limits will improve Outcome: Progressing Goal: Will remain free from infection Outcome: Progressing Goal: Diagnostic test results will improve Outcome: Progressing Goal: Respiratory complications will improve Outcome: Progressing Goal: Cardiovascular complication will be avoided Outcome: Progressing   Problem: Activity: Goal: Risk for activity intolerance will decrease Outcome: Progressing   Problem: Nutrition: Goal: Adequate nutrition will be maintained Outcome: Progressing   Problem: Coping: Goal: Level of anxiety will decrease Outcome: Progressing   Problem: Elimination: Goal: Will not experience complications related to bowel motility Outcome: Progressing Goal: Will not experience complications related to urinary retention Outcome: Progressing   Problem: Pain Management: Goal: General experience of comfort will improve Outcome: Progressing   Problem: Safety: Goal: Ability to remain free from injury will improve Outcome: Progressing   Problem: Skin Integrity: Goal: Risk for impaired skin integrity will decrease Outcome: Progressing   Problem: Education: Goal: Knowledge of risk factors and measures for prevention of condition will improve Outcome: Progressing   Problem: Coping: Goal: Psychosocial and spiritual needs will be supported Outcome: Progressing   Problem: Respiratory: Goal: Will maintain a patent airway Outcome: Progressing Goal: Complications related to the disease process, condition or treatment will be avoided or  minimized Outcome: Progressing

## 2023-01-10 NOTE — Plan of Care (Signed)
  Problem: Health Behavior/Discharge Planning: Goal: Ability to manage health-related needs will improve Outcome: Progressing   Problem: Clinical Measurements: Goal: Respiratory complications will improve Outcome: Progressing Goal: Cardiovascular complication will be avoided Outcome: Progressing   Problem: Nutrition: Goal: Adequate nutrition will be maintained Outcome: Progressing   Problem: Coping: Goal: Level of anxiety will decrease Outcome: Progressing   Problem: Pain Management: Goal: General experience of comfort will improve Outcome: Progressing   Problem: Safety: Goal: Ability to remain free from injury will improve Outcome: Progressing   Problem: Respiratory: Goal: Will maintain a patent airway Outcome: Progressing Goal: Complications related to the disease process, condition or treatment will be avoided or minimized Outcome: Progressing

## 2023-01-10 NOTE — Progress Notes (Signed)
Mobility Specialist Progress Note:   01/10/23 1400  Mobility  Activity Stood at bedside;Transferred from bed to chair  Level of Assistance Minimal assist, patient does 75% or more  Assistive Device Front wheel walker  Distance Ambulated (ft) 8 ft  Activity Response Tolerated well  Mobility Referral Yes  $Mobility charge 1 Mobility  Mobility Specialist Start Time (ACUTE ONLY) 1400  Mobility Specialist Stop Time (ACUTE ONLY) 1426  Mobility Specialist Time Calculation (min) (ACUTE ONLY) 26 min    Pt received in bed, agreeable to mobility. Performed x3 STS at EOB, with the first 2 requiring only supervision and cues. Required minA for the 3rd try. Able to stand and pivot to chair with minA. Asymptomatic w/ no complaints, only anxious. Became upset at end of session, as pt did not want me to leave. Pt left in chair after being calmed down with call bell and all needs met.  Danielle [name] Mobility Specialist Please contact via Special educational needs teacher or Rehab office at [phone]

## 2023-01-11 DIAGNOSIS — J9601 Acute respiratory failure with hypoxia: Secondary | ICD-10-CM | POA: Diagnosis not present

## 2023-01-11 NOTE — Progress Notes (Signed)
Mobility Specialist Progress Note:   01/11/23 1100  Mobility  Activity Transferred to/from [facility];Ambulated with assistance in room  Level of Assistance Minimal assist, patient does 75% or more  Assistive Device Front wheel walker  Distance Ambulated (ft) 4 ft  Activity Response Tolerated well  Mobility Referral Yes  $Mobility charge 1 Mobility  Mobility Specialist Start Time (ACUTE ONLY) 1020  Mobility Specialist Stop Time (ACUTE ONLY) 1055  Mobility Specialist Time Calculation (min) (ACUTE ONLY) 35 min    Pt received on BSC, agreeable to mobility. Assisted in pericare. Asymptomatic w/ no complaints. Pt left in chair with call bell and all needs met. NT aware.  D'Vante Earlene Plater Mobility Specialist Please contact via Special educational needs teacher or Rehab office at [phone]

## 2023-01-11 NOTE — Progress Notes (Addendum)
[facility] PROGRESS NOTE    Progress Note  Danielle [name]  AVW:[id number] DOB: [birth year] DOA: 12/25/2022 PCP: [place], [name], MD     Brief Narrative:   Danielle [name] is an 77 y.o. female past medical history significant for essential hypertension, hyperlipidemia systolic heart failure, paroxysmal atrial fibrillation on Eliquis presented to ED for shortness of breath and hypoxia found to be in sepsis probably due to pneumonia, there was also concern for PE VQ scan was done on 12/27/2022 that confirms a PE  Assessment/Plan:   Severe sepsis possibly due to pneumonia: Empirically on antibiotics blood cultures have been negative till date. Completed course of antibiotics in house. Mechanical fall/left knee pain/osteoarthritis: Fell on 12/31/2022 slid down to the ground no significant trauma. X-ray on films so severe degenerative disease will need to follow-up with orthopedic surgery as an outpatient. Awaiting insurance authorization for skilled nursing placement.  Acute respiratory failure with hypoxia likely due to PE/bilateral DVTs: Initially required nonrebreather now has been weaned to room air.  Acute PE/bilateral DVTs: D-dimer greater than 20, VQ scan confirm PE lower extremity Doppler was positive for DVT. Now on Eliquis.  Elevated troponins: Likely due to PE denies any chest pain.  Acute kidney injury chronic kidney see stage IIIb: With a baseline creatinine of around 1.6 on admission 3.1, now back to baseline.  Nausea vomiting and diarrhea: CT scan of the abdomen pelvis showed no acute findings now resolved.  Essential hypertension: Continue Coreg and Aldactone.  Paroxysmal atrial fibrillation: Patient is currently on Coreg and Eliquis and outpatient.  Chronic systolic heart failure: Continue Coreg and Aldactone 2D echo showed newly reduced EF of 45% will need follow-up with cardiology as an outpatient. Continue  losartan.  Hypothyroidism: Continue Synthroid.  Thrombocytopenia, Follow-up PCP as an outpatient.  History of bioprosthetic aortic valve: 2016 noted.  Obesity: Noted  COVID-19 infection: Tmax 100.6, she has defervesced and has no leukocytosis no respiratory symptoms blood cultures remain negative to date. Chest x-ray showed no acute findings. Continue to monitor and treat conservatively. She is stable to be discharged to skilled nursing facility   DVT prophylaxis: Eliquis Family Communication:none Status is: Inpatient Remains inpatient appropriate because: Acute PE and pneumonia    Code Status:     Code Status Orders  (From admission, onward)           Start     Ordered   12/25/22 1030  Do not attempt resuscitation (DNR) Pre-Arrest Interventions Desired  (Code Status)  Continuous       Question Answer Comment  If pulseless and not breathing No CPR or chest compressions.   In Pre-Arrest Conditions (Patient Has Pulse and Is Breathing) May intubate, use advanced airway interventions and cardioversion/ACLS medications if appropriate or indicated. May transfer to ICU.   Consent: Discussion documented in EHR or advanced directives reviewed      12/25/22 1029           Code Status History     Date Active Date Inactive Code Status Order ID Comments User Context   12/25/2022 0827 12/25/2022 1029 Full Code [id number]  Clydie Braun, MD ED   11/17/2020 0531 11/17/2020 1526 Full Code [id number]  [name], MD Inpatient   03/10/2020 0832 03/10/2020 1612 Full Code [id number]  [name], MD Inpatient   12/19/2019 1541 12/22/2019 1918 DNR [id number]  [name], MD ED   12/19/2019 1502 12/19/2019 1540 Full Code [id number]  [name], MD ED   08/28/2018  1641 09/01/2018 2115 DNR [id number]  [name], MD Inpatient   01/14/2017 2013 01/15/2017 1657 Full Code [id number]  [name], MD Inpatient   12/10/2015 1434 12/15/2015 2129 DNR  [id number]  [name], MD ED   12/10/2015 1327 12/10/2015 1434 Full Code [id number]  [name], MD ED   12/03/2015 1847 12/09/2015 2040 Full Code [id number]  Sharlene Dory, PA-C Inpatient   11/28/2015 2033 12/03/2015 1847 Full Code [id number]  [name], PA-C Inpatient   08/19/2015 2030 08/21/2015 2005 Full Code [id number]  Rama, [name], MD Inpatient         IV Access:   Peripheral IV   Procedures and diagnostic studies:   No results found.   Medical Consultants:   None.   Subjective:    Danielle [name] cough has resolved has remained asymptomatic.  Objective:    Vitals:   01/10/23 2000 01/11/23 0000 01/11/23 0400 01/11/23 0412  BP:    (!) 110/59  Pulse:    84  Resp:    18  Temp:    99 F (37.2 C)  TempSrc:    Oral  SpO2: 100% 100% 95% 94%  Weight:    108.6 kg  Height:       SpO2: 94 % O2 Flow Rate (L/min): 2 L/min FiO2 (%): 50 %   Intake/Output Summary (Last 24 hours) at 01/11/2023 0850 Last data filed at 01/11/2023 0416 Gross per 24 hour  Intake --  Output 450 ml  Net -450 ml   Filed Weights   01/08/23 0414 01/09/23 0330 01/11/23 0412  Weight: 109.5 kg 107.8 kg 108.6 kg    Exam: General exam: In no acute distress. Respiratory system: Good air movement and clear to auscultation. Cardiovascular system: S1 & S2 heard, RRR. No JVD. Gastrointestinal system: Abdomen is nondistended, soft and nontender.  Extremities: No pedal edema. Skin: No rashes, lesions or ulcers Psychiatry: Judgement and insight appear normal. Mood & affect appropriate. Data Reviewed:    Labs: Basic Metabolic Panel: No results for input(s): "NA", "K", "CL", "CO2", "GLUCOSE", "BUN", "CREATININE", "CALCIUM", "MG", "PHOS" in the last 168 hours.  GFR Estimated Creatinine Clearance: 39.4 mL/min (A) (by C-G formula based on SCr of 1.54 mg/dL (H)). Liver Function Tests: No results for input(s): "AST", "ALT", "ALKPHOS", "BILITOT", "PROT", "ALBUMIN" in  the last 168 hours.  No results for input(s): "LIPASE", "AMYLASE" in the last 168 hours. No results for input(s): "AMMONIA" in the last 168 hours. Coagulation profile No results for input(s): "INR", "PROTIME" in the last 168 hours. COVID-19 Labs  No results for input(s): "DDIMER", "FERRITIN", "LDH", "CRP" in the last 72 hours.  Lab Results  Component Value Date   SARSCOV2NAA POSITIVE (A) 01/09/2023   SARSCOV2NAA NEGATIVE 12/25/2022   SARSCOV2NAA NEGATIVE 03/07/2020   SARSCOV2NAA POSITIVE (A) 12/19/2019    CBC: Recent Labs  Lab 01/09/23 0426  WBC 6.7  NEUTROABS 5.1  HGB 11.5*  HCT 36.2  MCV 96.5  PLT 87*    Cardiac Enzymes: No results for input(s): "CKTOTAL", "CKMB", "CKMBINDEX", "TROPONINI" in the last 168 hours. BNP (last 3 results) No results for input(s): "PROBNP" in the last 8760 hours. CBG: No results for input(s): "GLUCAP" in the last 168 hours. D-Dimer: No results for input(s): "DDIMER" in the last 72 hours. Hgb A1c: No results for input(s): "HGBA1C" in the last 72 hours. Lipid Profile: No results for input(s): "CHOL", "HDL", "LDLCALC", "TRIG", "CHOLHDL", "LDLDIRECT" in the last 72 hours. Thyroid function studies: No results for input(s): "  TSH", "T4TOTAL", "T3FREE", "THYROIDAB" in the last 72 hours.  Invalid input(s): "FREET3" Anemia work up: No results for input(s): "VITAMINB12", "FOLATE", "FERRITIN", "TIBC", "IRON", "RETICCTPCT" in the last 72 hours. Sepsis Labs: Recent Labs  Lab 01/09/23 0426  WBC 6.7    Microbiology Recent Results (from the past 240 hour(s))  Culture, blood (Routine X 2) w Reflex to ID Panel     Status: None (Preliminary result)   Collection Time: 01/08/23  7:44 PM   Specimen: BLOOD RIGHT HAND  Result Value Ref Range Status   Specimen Description BLOOD RIGHT HAND  Final   Special Requests   Final    BOTTLES DRAWN AEROBIC AND ANAEROBIC Blood Culture results may not be optimal due to an excessive volume of blood received in  culture bottles   Culture   Final    NO GROWTH 3 DAYS Performed at [facility], [address] [place], [place] [address]    Report Status PENDING  Incomplete  Culture, blood (Routine X 2) w Reflex to ID Panel     Status: None (Preliminary result)   Collection Time: 01/08/23  7:47 PM   Specimen: BLOOD RIGHT HAND  Result Value Ref Range Status   Specimen Description BLOOD RIGHT HAND  Final   Special Requests   Final    BOTTLES DRAWN AEROBIC AND ANAEROBIC Blood Culture adequate volume   Culture   Final    NO GROWTH 3 DAYS Performed at [facility], [address] [place], [place] [address]    Report Status PENDING  Incomplete  Respiratory (~20 pathogens) panel by PCR     Status: None   Collection Time: 01/09/23  8:56 AM   Specimen: Nasopharyngeal Swab; Respiratory  Result Value Ref Range Status   Adenovirus NOT DETECTED NOT DETECTED Final   Coronavirus 229E NOT DETECTED NOT DETECTED Final    Comment: (NOTE) The Coronavirus on the Respiratory Panel, DOES NOT test for the novel  Coronavirus (2019 nCoV)    Coronavirus HKU1 NOT DETECTED NOT DETECTED Final   Coronavirus NL63 NOT DETECTED NOT DETECTED Final   Coronavirus OC43 NOT DETECTED NOT DETECTED Final   Metapneumovirus NOT DETECTED NOT DETECTED Final   Rhinovirus / Enterovirus NOT DETECTED NOT DETECTED Final   Influenza A NOT DETECTED NOT DETECTED Final   Influenza B NOT DETECTED NOT DETECTED Final   Parainfluenza Virus 1 NOT DETECTED NOT DETECTED Final   Parainfluenza Virus 2 NOT DETECTED NOT DETECTED Final   Parainfluenza Virus 3 NOT DETECTED NOT DETECTED Final   Parainfluenza Virus 4 NOT DETECTED NOT DETECTED Final   Respiratory Syncytial Virus NOT DETECTED NOT DETECTED Final   Bordetella pertussis NOT DETECTED NOT DETECTED Final   Bordetella Parapertussis NOT DETECTED NOT DETECTED Final   Chlamydophila pneumoniae NOT DETECTED NOT DETECTED Final   Mycoplasma pneumoniae NOT DETECTED NOT DETECTED Final     Comment: Performed at [facility], [address] [place], [place] [address]  SARS Coronavirus 2 by RT PCR (hospital order, performed in [facility] hospital lab) *cepheid single result test* Anterior Nasal Swab     Status: Abnormal   Collection Time: 01/09/23  8:57 AM   Specimen: Anterior Nasal Swab  Result Value Ref Range Status   SARS Coronavirus 2 by RT PCR POSITIVE (A) NEGATIVE Final    Comment: Performed at [facility], [address], [place], [place] [address]     Medications:    apixaban  5 mg Oral BID  arformoterol  15 mcg Nebulization BID   budesonide (PULMICORT) nebulizer solution  0.5 mg Nebulization BID   carvedilol  25 mg Oral BID WC   lidocaine  2 patch Transdermal Q24H   losartan  25 mg Oral Daily   sodium chloride flush  3 mL Intravenous Q12H   Continuous Infusions:    LOS: 17 days   Marinda Elk  [facility]  01/11/2023, 8:50 AM

## 2023-01-11 NOTE — Plan of Care (Signed)
  Problem: Education: Goal: Knowledge of General Education information will improve Description: Including pain rating scale, medication(s)/side effects and non-pharmacologic comfort measures Outcome: Progressing   Problem: Health Behavior/Discharge Planning: Goal: Ability to manage health-related needs will improve Outcome: Progressing   Problem: Clinical Measurements: Goal: Ability to maintain clinical measurements within normal limits will improve Outcome: Progressing Goal: Will remain free from infection Outcome: Progressing Goal: Diagnostic test results will improve Outcome: Progressing Goal: Respiratory complications will improve Outcome: Progressing Goal: Cardiovascular complication will be avoided Outcome: Progressing   Problem: Activity: Goal: Risk for activity intolerance will decrease Outcome: Progressing   Problem: Nutrition: Goal: Adequate nutrition will be maintained Outcome: Progressing   Problem: Coping: Goal: Level of anxiety will decrease Outcome: Progressing   Problem: Elimination: Goal: Will not experience complications related to bowel motility Outcome: Progressing Goal: Will not experience complications related to urinary retention Outcome: Progressing   Problem: Pain Management: Goal: General experience of comfort will improve Outcome: Progressing   Problem: Safety: Goal: Ability to remain free from injury will improve Outcome: Progressing   Problem: Skin Integrity: Goal: Risk for impaired skin integrity will decrease Outcome: Progressing   Problem: Education: Goal: Knowledge of risk factors and measures for prevention of condition will improve Outcome: Progressing   Problem: Coping: Goal: Psychosocial and spiritual needs will be supported Outcome: Progressing   Problem: Respiratory: Goal: Will maintain a patent airway Outcome: Progressing Goal: Complications related to the disease process, condition or treatment will be avoided or  minimized Outcome: Progressing

## 2023-01-11 NOTE — Progress Notes (Signed)
Physical Therapy Treatment Patient Details Name: Danielle [name] MRN: [id number] DOB: 30-Aug-1945 Today's Date: 01/11/2023   History of Present Illness Pt is 77 year old presented to [facility] on  12/25/22 for acute respiratory failure with hypoxia and copd exacerbation. VQ scan confirms PE 10/28, age indeterminate B LE DVTs. PMH - anxiety, breast cancer, COPD, CHF, CAD, HTN, and CVA with residual RUE weakness, Lt hip fx with IM nail    PT Comments  Pt with better progress today but continues to be limited in mobility by L knee pain. Pt independent with chair level exercises prior to mobilization. With increased time and effort pt is able to come to standing z2 with contact guard assist. Pt able to ambulate 4 feet forward and 4 feet backwards, on second attempt only able to step 4 feet forward due to onset of L knee buckling. Continue to recommend continued inpatient follow up therapy, <3 hours/day. PT will continue to follow acutely.      If plan is discharge home, recommend the following: A little help with walking and/or transfers;A little help with bathing/dressing/bathroom;Assistance with cooking/housework;Assist for transportation   Can travel by private vehicle     No  Equipment Recommendations  None recommended by PT       Precautions / Restrictions Precautions Precautions: Fall Precaution Comments: watch SpO2. HR and SOB Restrictions Weight Bearing Restrictions: No     Mobility  Bed Mobility Overal bed mobility: Needs Assistance         Sit to supine: Mod assist   General bed mobility comments: received in recliner, asks to sit in recliner after session    Transfers Overall transfer level: Needs assistance Equipment used: Rolling walker (2 wheels) Transfers: Sit to/from Stand Sit to Stand: Contact guard assist           General transfer comment: pt with better STS today, continues to require increased time and effort but is able to come to standing 2x  with contact guard assist, limited by inability to bear full weight through her L LE    Ambulation/Gait Ambulation/Gait assistance: +2 safety/equipment, Mod assist Gait Distance (Feet): 4 Feet (4 forward, 4 backward and 4 more forward) Assistive device: Rolling walker (2 wheels) Gait Pattern/deviations: Step-to pattern, Decreased step length - right, Decreased step length - left, Trunk flexed, Antalgic, Knee flexed in stance - left, Decreased weight shift to left, Decreased stance time - left Gait velocity: decr Gait velocity interpretation: <1.31 ft/sec, indicative of household ambulator   General Gait Details: minA for steadying, vc for proximity to RW and upright posture, continues to require cues for sequencing         Balance Overall balance assessment: Needs assistance Sitting-balance support: No upper extremity supported, Feet supported Sitting balance-Leahy Scale: Fair     Standing balance support: Bilateral upper extremity supported, During functional activity, Reliant on assistive device for balance Standing balance-Leahy Scale: Poor Standing balance comment: Reliant on RW throughout standing activity with forward lean                            Cognition Arousal: Alert Behavior During Therapy: WFL for tasks assessed/performed Overall Cognitive Status: No family/caregiver present to determine baseline cognitive functioning                                 General Comments: continues to have poor short term memory, was  up to [facility] just prior to therapy session, but at end of session pt report she needs to use BSC, when reminded she was just there, she did not remember        Exercises General Exercises - Lower Extremity Ankle Circles/Pumps: AROM, Both, 10 reps, Supine Long Arc Quad: AROM, Both, 15 reps, Seated Hip Flexion/Marching: AROM, Both, 10 reps, Seated (3 sets) Other Exercises Other Exercises: chair push ups x4    General Comments  General comments (skin integrity, edema, etc.): VSS on RA      Pertinent Vitals/Pain Pain Assessment Pain Assessment: Faces Faces Pain Scale: Hurts even more Pain Location: L knee Pain Descriptors / Indicators: Discomfort, Grimacing, Guarding Pain Intervention(s): Limited activity within patient's tolerance, Monitored during session, Repositioned     PT Goals (current goals can now be found in the care plan section) Acute Rehab PT Goals Patient Stated Goal: return home PT Goal Formulation: With patient Time For Goal Achievement: 01/21/23 Potential to Achieve Goals: Good Progress towards PT goals: Progressing toward goals    Frequency    Min 1X/week       AM-PAC PT "6 Clicks" Mobility   Outcome Measure  Help needed turning from your back to your side while in a flat bed without using bedrails?: A Little Help needed moving from lying on your back to sitting on the side of a flat bed without using bedrails?: A Little Help needed moving to and from a bed to a chair (including a wheelchair)?: A Lot Help needed standing up from a chair using your arms (e.g., wheelchair or bedside chair)?: A Little Help needed to walk in hospital room?: Total Help needed climbing 3-5 steps with a railing? : A Lot 6 Click Score: 14    End of Session Equipment Utilized During Treatment: Gait belt Activity Tolerance: Patient tolerated treatment well Patient left: with call bell/phone within reach;with bed alarm set;in bed Nurse Communication: Mobility status PT Visit Diagnosis: Other abnormalities of gait and mobility (R26.89);Muscle weakness (generalized) (M62.81);History of falling (Z91.81)     Time: 1610-9604 PT Time Calculation (min) (ACUTE ONLY): 25 min  Charges:    $Gait Training: 8-22 mins $Therapeutic Exercise: 8-22 mins PT General Charges $$ ACUTE PT VISIT: 1 Visit                     Derya Dettmann B. Beverely Risen PT, DPT Acute Rehabilitation Services Please use secure chat or   Call Office [phone]    [facility] 01/11/2023, 3:07 PM

## 2023-01-11 NOTE — Progress Notes (Addendum)
Occupational Therapy Treatment Patient Details Name: Danielle [name] MRN: [id number] DOB: 1946-02-16 Today's Date: 01/11/2023   History of present illness Pt is 77 year old presented to [facility] on  12/25/22 for acute respiratory failure with hypoxia and copd exacerbation. VQ scan confirms PE 10/28, age indeterminate B LE DVTs. New diagnosis of COVID-19 on  PMH - anxiety, breast cancer, COPD, CHF, CAD, HTN, and CVA with residual RUE weakness, Lt hip fx with IM nail   OT comments  OT session focused on training in techniques for increased safety and independence with ADLs and functional transfers during ADLs. Pt currently demonstrates ability to largely complete UB ADLs with Set up to Min assist, LB ADLs with Set up to Min assist in siting/lateral leans and Mod to Max assist in sit/stand, and functional transfers with a RW with Contact guard to Min assist. Pt participated well in session and is making slow but consistent progress toward goals. Goals updated this day. Pt progress has been limited by L knee pain and decreased cognition necessitating increased repetition of training. Pt will benefit from continued acute skilled OT services to address deficits outlined below, decrease caregiver burden, and increase safety and independence with functional tasks. Post acute discharge, OT recommends intensive inpatient skilled rehab services < 3 hours per day to maximize rehab potential. Once intensive inpatient rehab is complete, pt is likely a good candidate for a PACE program.       If plan is discharge home, recommend the following:  A little help with walking and/or transfers;A lot of help with bathing/dressing/bathroom;Assistance with cooking/housework;Assist for transportation;Help with stairs or ramp for entrance;Direct supervision/assist for medications management;Direct supervision/assist for financial management   Equipment Recommendations  Other (comment) (defer to next level of care)     Recommendations for Other Services      Precautions / Restrictions Precautions Precautions: Fall Precaution Comments: watch SpO2. HR and SOB Restrictions Weight Bearing Restrictions: No       Mobility Bed Mobility Overal bed mobility: Needs Assistance             General bed mobility comments: Pt sitting in recliner at beginning and end of session.    Transfers Overall transfer level: Needs assistance Equipment used: Rolling walker (2 wheels) Transfers: Sit to/from Stand, Bed to chair/wheelchair/BSC Sit to Stand: Min assist, Contact guard assist     Step pivot transfers: Min assist, Contact guard assist     General transfer comment: Min assist to power up then CGA once in standing. Pt requiring increased time and effort during sit<->stand.     Balance Overall balance assessment: Needs assistance Sitting-balance support: No upper extremity supported, Feet supported Sitting balance-Leahy Scale: Fair     Standing balance support: Bilateral upper extremity supported, During functional activity, Reliant on assistive device for balance Standing balance-Leahy Scale: Poor Standing balance comment: Reliant on RW throughout standing activity with forward lean                           ADL either performed or assessed with clinical judgement   ADL Overall ADL's : Needs assistance/impaired Eating/Feeding: Set up;Sitting   Grooming: Contact guard assist;Standing;Wash/dry face   Upper Body Bathing: Set up;Sitting   Lower Body Bathing: Minimal assistance;Sitting/lateral leans;Maximal assistance;Sit to/from stand Lower Body Bathing Details (indicate cue type and reason): pt requiring increased assistance in sit/stand due to increased L knee pain in standing Upper Body Dressing : Set up;Sitting   Lower Body Dressing:  Set up;Sitting/lateral leans;Moderate assistance;Maximal assistance;Sit to/from stand Lower Body Dressing Details (indicate cue type and reason):  Pt able to doff/donn socks and thread B LE through pant legs with Set up and requires Mod-Max assist to bring LB clothing up over hips due to pain in L knee with standing Toilet Transfer: Contact guard assist;Minimal assistance;Ambulation;Rolling walker (2 wheels);BSC/3in1 (Min assist to power up and then CGA once in standing)   Toileting- Clothing Manipulation and Hygiene: Moderate assistance;Sitting/lateral lean;Sit to/from stand       Functional mobility during ADLs: Contact guard assist;Rolling walker (2 wheels);+2 for safety/equipment (+2 for chair follow for safety) General ADL Comments: Pt with improving activity tolerance but continues to be limited by L knee pain and impaired cognition that requires frequent repetition of training.    Extremity/Trunk Assessment Upper Extremity Assessment Upper Extremity Assessment: Right hand dominant;Generalized weakness;RUE deficits/detail;LUE deficits/detail RUE Deficits / Details: generalized weakness LUE Deficits / Details: general weakness; decreased proprioception; decreased fine and gross motor coordination LUE Sensation: history of peripheral neuropathy LUE Coordination: decreased fine motor;decreased gross motor   Lower Extremity Assessment Lower Extremity Assessment: Defer to PT evaluation        Vision       Perception     Praxis      Cognition Arousal: Alert Behavior During Therapy: [facility] for tasks assessed/performed, Anxious (Largely WFL with mild signs of anxiety when discussing barriers to returning home with husband. Pt was quickly comfortable by being reminded that she is working with OT/PT to get stronger and address deficits and encouraged to focus on the present.) Overall Cognitive Status: Impaired/Different from baseline (No family/caregiver present to confirm baseline) Area of Impairment: Attention, Memory, Following commands, Safety/judgement, Awareness, Problem solving                   Current Attention  Level: Selective Memory: Decreased short-term memory Following Commands: Follows one step commands consistently, Follows one step commands with increased time Safety/Judgement: Decreased awareness of safety, Decreased awareness of deficits Awareness: Emergent Problem Solving: Slow processing, Difficulty sequencing, Decreased initiation, Requires verbal cues, Requires tactile cues          Exercises      Shoulder Instructions       General Comments VSS on RA throughout session. NT entering room at end of session.    Pertinent Vitals/ Pain       Pain Assessment Pain Assessment: Faces Faces Pain Scale: Hurts whole lot Pain Location: L knee Pain Descriptors / Indicators: Discomfort, Grimacing, Guarding Pain Intervention(s): Limited activity within patient's tolerance, Monitored during session, Repositioned  Home Living                                          Prior Functioning/Environment              Frequency  Min 1X/week        Progress Toward Goals  OT Goals(current goals can now be found in the care plan section)  Progress towards OT goals: Progressing toward goals;Goals updated (Slow progress toward goals. Pt limited by continued L knee pain and decreased cognition.)  Acute Rehab OT Goals Patient Stated Goal: to return home with her husband OT Goal Formulation: With patient Time For Goal Achievement: 01/25/23 Potential to Achieve Goals: Good ADL Goals Pt Will Perform Lower Body Dressing: with contact guard assist;sitting/lateral leans;sit to/from stand Pt Will Transfer to  Toilet: with supervision;ambulating Pt/caregiver will Perform Home Exercise Program: Increased strength;Both right and left upper extremity;With theraband;With Supervision;With written HEP provided (with cues as needed) Additional ADL Goal #1: Pt will verbalize at least 2 energy conservation strategies to implement with ADLs/mobility with handout provided.  Plan       Co-evaluation                 AM-PAC OT "6 Clicks" Daily Activity     Outcome Measure   Help from another person eating meals?: A Little Help from another person taking care of personal grooming?: A Little Help from another person toileting, which includes using toliet, bedpan, or urinal?: A Lot Help from another person bathing (including washing, rinsing, drying)?: A Lot Help from another person to put on and taking off regular upper body clothing?: A Little Help from another person to put on and taking off regular lower body clothing?: A Lot 6 Click Score: 15    End of Session    OT Visit Diagnosis: Muscle weakness (generalized) (M62.81);Pain   Activity Tolerance     Patient Left     Nurse Communication          Time: 8119-1478 OT Time Calculation (min): 22 min  Charges: OT General Charges $OT Visit: 1 Visit OT Treatments $Self Care/Home Management : 8-22 mins  Jamia Hoban "Orson Eva., OTR/L, MA Acute Rehab [phone]   Lendon Colonel 01/11/2023, 5:24 PM

## 2023-01-12 DIAGNOSIS — J9601 Acute respiratory failure with hypoxia: Secondary | ICD-10-CM | POA: Diagnosis not present

## 2023-01-12 DIAGNOSIS — N179 Acute kidney failure, unspecified: Secondary | ICD-10-CM | POA: Diagnosis not present

## 2023-01-12 LAB — COMPREHENSIVE METABOLIC PANEL
ALT: 18 U/L (ref 0–44)
AST: 18 U/L (ref 15–41)
Albumin: 2.8 g/dL — ABNORMAL LOW (ref 3.5–5.0)
Alkaline Phosphatase: 71 U/L (ref 38–126)
Anion gap: 7 (ref 5–15)
BUN: 15 mg/dL (ref 8–23)
CO2: 23 mmol/L (ref 22–32)
Calcium: 9.4 mg/dL (ref 8.9–10.3)
Chloride: 106 mmol/L (ref 98–111)
Creatinine, Ser: 1.74 mg/dL — ABNORMAL HIGH (ref 0.44–1.00)
GFR, Estimated: 30 mL/min — ABNORMAL LOW (ref >60)
Glucose, Bld: 120 mg/dL — ABNORMAL HIGH (ref 70–99)
Potassium: 4 mmol/L (ref 3.5–5.1)
Sodium: 136 mmol/L (ref 135–145)
Total Bilirubin: 0.8 mg/dL (ref <1.2)
Total Protein: 6.2 g/dL — ABNORMAL LOW (ref 6.5–8.1)

## 2023-01-12 LAB — CBC
HCT: 36.9 % (ref 36.0–46.0)
Hemoglobin: 11.9 g/dL — ABNORMAL LOW (ref 12.0–15.0)
MCH: 30.3 pg (ref 26.0–34.0)
MCHC: 32.2 g/dL (ref 30.0–36.0)
MCV: 93.9 fL (ref 80.0–100.0)
Platelets: 77 10*3/uL — ABNORMAL LOW (ref 150–400)
RBC: 3.93 MIL/uL (ref 3.87–5.11)
RDW: 13.2 % (ref 11.5–15.5)
WBC: 4.1 10*3/uL (ref 4.0–10.5)
nRBC: 0 % (ref 0.0–0.2)

## 2023-01-12 NOTE — Plan of Care (Signed)
  Problem: Education: Goal: Knowledge of General Education information will improve Description: Including pain rating scale, medication(s)/side effects and non-pharmacologic comfort measures Outcome: Progressing   Problem: Health Behavior/Discharge Planning: Goal: Ability to manage health-related needs will improve Outcome: Progressing   Problem: Clinical Measurements: Goal: Ability to maintain clinical measurements within normal limits will improve Outcome: Progressing Goal: Will remain free from infection Outcome: Progressing Goal: Diagnostic test results will improve Outcome: Progressing Goal: Respiratory complications will improve Outcome: Progressing Goal: Cardiovascular complication will be avoided Outcome: Progressing   Problem: Activity: Goal: Risk for activity intolerance will decrease Outcome: Progressing   Problem: Nutrition: Goal: Adequate nutrition will be maintained Outcome: Progressing   Problem: Coping: Goal: Level of anxiety will decrease Outcome: Progressing   Problem: Elimination: Goal: Will not experience complications related to bowel motility Outcome: Progressing Goal: Will not experience complications related to urinary retention Outcome: Progressing   Problem: Pain Management: Goal: General experience of comfort will improve Outcome: Progressing   Problem: Safety: Goal: Ability to remain free from injury will improve Outcome: Progressing   Problem: Skin Integrity: Goal: Risk for impaired skin integrity will decrease Outcome: Progressing   Problem: Education: Goal: Knowledge of risk factors and measures for prevention of condition will improve Outcome: Progressing   Problem: Coping: Goal: Psychosocial and spiritual needs will be supported Outcome: Progressing   Problem: Respiratory: Goal: Will maintain a patent airway Outcome: Progressing Goal: Complications related to the disease process, condition or treatment will be avoided or  minimized Outcome: Progressing

## 2023-01-12 NOTE — Progress Notes (Signed)
Mobility Specialist Progress Note:   01/12/23 1500  Mobility  Activity Ambulated with assistance in hallway  Level of Assistance +2 (takes two people) (MinA + chair follow)  Assistive Device Front wheel walker  Distance Ambulated (ft) 65 ft  Activity Response Tolerated well  Mobility Referral Yes  $Mobility charge 1 Mobility  Mobility Specialist Start Time (ACUTE ONLY) 1508  Mobility Specialist Stop Time (ACUTE ONLY) 1525  Mobility Specialist Time Calculation (min) (ACUTE ONLY) 17 min    Pt received in chair, agreeable to mobility. Required minA to stand after x3 failed attempts. Asymptomatic w/ no complaints throughout. Required less verbal cues for upright posture. NT assisted in chair follow. Pt wheeled back to room. Left in chair with call needs met and NT present.  D'Vante Earlene Plater Mobility Specialist Please contact via Special educational needs teacher or Rehab office at [phone]

## 2023-01-12 NOTE — Progress Notes (Signed)
  Progress Note   Patient: Danielle [name] QQV:[id number] DOB: June 26, 1945 DOA: 12/25/2022     18 DOS: the patient was seen and examined on 01/12/2023   Brief hospital course: 77 y.o. female past medical history significant for essential hypertension, hyperlipidemia systolic heart failure, paroxysmal atrial fibrillation on Eliquis presented to ED for shortness of breath and hypoxia found to be in sepsis probably due to pneumonia, there was also concern for PE VQ scan was done on 12/27/2022 that confirms a PE   Assessment and Plan: Severe sepsis possibly due to pneumonia: Empirically on antibiotics blood cultures have been negative till date. Completed course of antibiotics in house. Mechanical fall/left knee pain/osteoarthritis: Fell on 12/31/2022 slid down to the ground no significant trauma. X-ray on films so severe degenerative disease will need to follow-up with orthopedic surgery as an outpatient. Awaiting insurance authorization for skilled nursing placement.   Acute respiratory failure with hypoxia likely due to PE/bilateral DVTs: Initially required nonrebreather now has been weaned to room air.   Acute PE/bilateral DVTs: D-dimer greater than 20, VQ scan confirm PE lower extremity Doppler was positive for DVT. Now on Eliquis. Stable   Elevated troponins: Likely due to PE denies any chest pain.   Acute kidney injury chronic kidney see stage IIIb: With a baseline creatinine of around 1.6 on admission 3.1, now back to baseline.   Nausea vomiting and diarrhea: CT scan of the abdomen pelvis showed no acute findings now resolved.   Essential hypertension: Continue Coreg and Aldactone.   Paroxysmal atrial fibrillation: Patient is currently on Coreg and Eliquis and outpatient.   Chronic systolic heart failure: Continue Coreg and Aldactone 2D echo showed newly reduced EF of 45% will need follow-up with cardiology as an outpatient. Continue losartan.    Hypothyroidism: Continue Synthroid.   Thrombocytopenia, Follow-up PCP as an outpatient.   History of bioprosthetic aortic valve: 2016 noted.   Morbid obesity: Noted   COVID-19 infection: Tmax 100.6, she has defervesced and has no leukocytosis no respiratory symptoms blood cultures remain negative to date. Chest x-ray showed no acute findings. Continue to monitor and treat conservatively. She is stable to be discharged to skilled nursing facility, [facility] working on placement       Subjective: Eager to be discharged soon. Still feeling weak this AM  Physical Exam: Vitals:   01/12/23 0500 01/12/23 0809 01/12/23 0823 01/12/23 1248  BP: (!) 91/57 100/72  100/69  Pulse: 81 81  70  Resp: 18   17  Temp: 98.6 F (37 C) 99.1 F (37.3 C)  97.9 F (36.6 C)  TempSrc: Oral Oral  Oral  SpO2: 95%  96% 99%  Weight:      Height:       General exam: Awake, laying in bed, in nad Respiratory system: Normal respiratory effort, no wheezing Cardiovascular system: regular rate, s1, s2 Gastrointestinal system: Soft, nondistended, positive BS Central nervous system: CN2-12 grossly intact, strength intact Extremities: Perfused, no clubbing Skin: Normal skin turgor, no notable skin lesions seen Psychiatry: Mood normal // no visual hallucinations   Data Reviewed:  Labs reviewed: Na 135, K 4.0, Cr 1.74, WBC 4.1, Hgb 11.9, Plts 77  Family Communication: Pt in room, family not at bedside  Disposition: Status is: Inpatient Remains inpatient appropriate because: severity of illness  Planned Discharge Destination: Skilled nursing facility     Author: Rickey Barbara, MD 01/12/2023 4:21 PM  For on call review [email or web address].

## 2023-01-12 NOTE — Progress Notes (Signed)
Physical Therapy Treatment Patient Details Name: Danielle [name] MRN: [id number] DOB: 03-03-1945 Today's Date: 01/12/2023   History of Present Illness Pt is 77 year old presented to [facility] on  12/25/22 for acute respiratory failure with hypoxia and copd exacerbation. VQ scan confirms PE 10/28, age indeterminate B LE DVTs. New diagnosis of COVID-19 on  PMH - anxiety, breast cancer, COPD, CHF, CAD, HTN, and CVA with residual RUE weakness, Lt hip fx with IM nail    PT Comments  Pt admitted with above diagnosis. Pt was able to ambulate with RW with min assist and chair follow and incr distance. Pt anxious and is not confident unless following with chair.  May be approaching baseline.  Will need 24 hour care at d/c.   Pt currently with functional limitations due to the deficits listed below (see PT Problem List). Pt will benefit from acute skilled PT to increase their independence and safety with mobility to allow discharge.       If plan is discharge home, recommend the following: A little help with walking and/or transfers;A little help with bathing/dressing/bathroom;Assistance with cooking/housework;Assist for transportation   Can travel by private vehicle     No  Equipment Recommendations  None recommended by PT    Recommendations for Other Services       Precautions / Restrictions Precautions Precautions: Fall Precaution Comments: watch SpO2. HR and SOB; COVID positive Restrictions Weight Bearing Restrictions: No     Mobility  Bed Mobility               General bed mobility comments: Pt sitting in recliner at beginning and end of session.    Transfers Overall transfer level: Needs assistance Equipment used: Rolling walker (2 wheels) Transfers: Sit to/from Stand, Bed to chair/wheelchair/BSC Sit to Stand: Min assist           General transfer comment: Min cues to power up for hand and foot placement then CGA once in standing. Pt requiring increased time  and effort during sit<->stand.    Ambulation/Gait Ambulation/Gait assistance: +2 safety/equipment, Min assist Gait Distance (Feet): 60 Feet Assistive device: Rolling walker (2 wheels) Gait Pattern/deviations: Step-to pattern, Decreased step length - right, Decreased step length - left, Trunk flexed, Antalgic, Knee flexed in stance - left, Decreased weight shift to left, Decreased stance time - left Gait velocity: decr Gait velocity interpretation: <1.31 ft/sec, indicative of household ambulator   General Gait Details: min assist for steadying, constant vc for proximity to RW and upright posture, continues to require cues for sequencing   Stairs             Wheelchair Mobility     Tilt Bed    Modified Rankin (Stroke Patients Only)       Balance Overall balance assessment: Needs assistance Sitting-balance support: No upper extremity supported, Feet supported Sitting balance-Leahy Scale: Fair     Standing balance support: Bilateral upper extremity supported, During functional activity, Reliant on assistive device for balance Standing balance-Leahy Scale: Poor Standing balance comment: Reliant on RW throughout standing activity with forward lean                            Cognition Arousal: Alert Behavior During Therapy: WFL for tasks assessed/performed, Anxious (Largely WFL with mild signs of anxiety when discussing barriers to returning home with husband. Pt was quickly comfortable by being reminded that she is working with OT/PT to get stronger and address deficits and encouraged  to focus on the present.) Overall Cognitive Status: Impaired/Different from baseline (No family/caregiver present to confirm baseline) Area of Impairment: Attention, Memory, Following commands, Safety/judgement, Awareness, Problem solving                   Current Attention Level: Selective Memory: Decreased short-term memory Following Commands: Follows one step commands  consistently, Follows one step commands with increased time Safety/Judgement: Decreased awareness of safety, Decreased awareness of deficits Awareness: Emergent Problem Solving: Slow processing, Difficulty sequencing, Decreased initiation, Requires verbal cues, Requires tactile cues General Comments: continues to have poor short term memory        Exercises General Exercises - Lower Extremity Ankle Circles/Pumps: AROM, Both, 10 reps, Supine Long Arc Quad: AROM, Both, 15 reps, Seated Hip Flexion/Marching: AROM, Both, Seated, 5 reps Other Exercises Other Exercises: chair push ups x4    General Comments General comments (skin integrity, edema, etc.): VSS      Pertinent Vitals/Pain Pain Assessment Pain Assessment: Faces Faces Pain Scale: Hurts whole lot Pain Location: L knee Pain Descriptors / Indicators: Discomfort, Grimacing, Guarding Pain Intervention(s): Limited activity within patient's tolerance, Monitored during session, Repositioned    Home Living                          Prior Function            PT Goals (current goals can now be found in the care plan section) Acute Rehab PT Goals Patient Stated Goal: return home Progress towards PT goals: Progressing toward goals    Frequency    Min 1X/week      PT Plan      Co-evaluation              AM-PAC PT "6 Clicks" Mobility   Outcome Measure  Help needed turning from your back to your side while in a flat bed without using bedrails?: A Little Help needed moving from lying on your back to sitting on the side of a flat bed without using bedrails?: A Little Help needed moving to and from a bed to a chair (including a wheelchair)?: A Lot Help needed standing up from a chair using your arms (e.g., wheelchair or bedside chair)?: A Little Help needed to walk in hospital room?: Total Help needed climbing 3-5 steps with a railing? : A Lot 6 Click Score: 14    End of Session Equipment Utilized  During Treatment: Gait belt Activity Tolerance: Patient limited by fatigue Patient left: with call bell/phone within reach;in chair;with chair alarm set Nurse Communication: Mobility status PT Visit Diagnosis: Other abnormalities of gait and mobility (R26.89);Muscle weakness (generalized) (M62.81);History of falling (Z91.81)     Time: 5366-4403 PT Time Calculation (min) (ACUTE ONLY): 25 min  Charges:    $Gait Training: 8-22 mins $Therapeutic Exercise: 8-22 mins PT General Charges $$ ACUTE PT VISIT: 1 Visit                     Jonanthony Nahar M,PT Acute Rehab Services [phone]    Bevelyn Buckles 01/12/2023, 1:59 PM

## 2023-01-13 DIAGNOSIS — J9601 Acute respiratory failure with hypoxia: Secondary | ICD-10-CM | POA: Diagnosis not present

## 2023-01-13 DIAGNOSIS — N179 Acute kidney failure, unspecified: Secondary | ICD-10-CM | POA: Diagnosis not present

## 2023-01-13 LAB — CULTURE, BLOOD (ROUTINE X 2)
Culture: NO GROWTH
Culture: NO GROWTH
Special Requests: ADEQUATE

## 2023-01-13 LAB — CBC
HCT: 36.5 % (ref 36.0–46.0)
Hemoglobin: 11.9 g/dL — ABNORMAL LOW (ref 12.0–15.0)
MCH: 30.9 pg (ref 26.0–34.0)
MCHC: 32.6 g/dL (ref 30.0–36.0)
MCV: 94.8 fL (ref 80.0–100.0)
Platelets: 79 10*3/uL — ABNORMAL LOW (ref 150–400)
RBC: 3.85 MIL/uL — ABNORMAL LOW (ref 3.87–5.11)
RDW: 13.2 % (ref 11.5–15.5)
WBC: 3.3 10*3/uL — ABNORMAL LOW (ref 4.0–10.5)
nRBC: 0 % (ref 0.0–0.2)

## 2023-01-13 LAB — COMPREHENSIVE METABOLIC PANEL
ALT: 18 U/L (ref 0–44)
AST: 19 U/L (ref 15–41)
Albumin: 2.6 g/dL — ABNORMAL LOW (ref 3.5–5.0)
Alkaline Phosphatase: 68 U/L (ref 38–126)
Anion gap: 6 (ref 5–15)
BUN: 15 mg/dL (ref 8–23)
CO2: 21 mmol/L — ABNORMAL LOW (ref 22–32)
Calcium: 9.7 mg/dL (ref 8.9–10.3)
Chloride: 103 mmol/L (ref 98–111)
Creatinine, Ser: 1.5 mg/dL — ABNORMAL HIGH (ref 0.44–1.00)
GFR, Estimated: 36 mL/min — ABNORMAL LOW (ref >60)
Glucose, Bld: 90 mg/dL (ref 70–99)
Potassium: 4.2 mmol/L (ref 3.5–5.1)
Sodium: 130 mmol/L — ABNORMAL LOW (ref 135–145)
Total Bilirubin: 1 mg/dL (ref <1.2)
Total Protein: 6.1 g/dL — ABNORMAL LOW (ref 6.5–8.1)

## 2023-01-13 MED ORDER — POLYETHYLENE GLYCOL 3350 17 G PO PACK
17.0000 g | PACK | Freq: Every day | ORAL | Status: DC | PRN
  Filled 2023-01-13: qty 1

## 2023-01-13 NOTE — Progress Notes (Signed)
Pt lost IV access, permission to not replace was obtained from Henning, MD.  Bari Edward, RN

## 2023-01-13 NOTE — Progress Notes (Signed)
  Progress Note   Patient: Danielle [name] ZOX:[id number] DOB: 1945-09-10 DOA: 12/25/2022     19 DOS: the patient was seen and examined on 01/13/2023   Brief hospital course: 77 y.o. female past medical history significant for essential hypertension, hyperlipidemia systolic heart failure, paroxysmal atrial fibrillation on Eliquis presented to ED for shortness of breath and hypoxia found to be in sepsis probably due to pneumonia, there was also concern for PE VQ scan was done on 12/27/2022 that confirms a PE   Assessment and Plan: Severe sepsis possibly due to pneumonia: Empirically on antibiotics blood cultures have been negative till date. Completed course of antibiotics in house. Mechanical fall/left knee pain/osteoarthritis: Fell on 12/31/2022 slid down to the ground no significant trauma. X-ray on films so severe degenerative disease will need to follow-up with orthopedic surgery as an outpatient. Discussed with TOC. SNF was recommended by therapy initially. Plan now for pt to continue with [facility] program for pt to improve enough to d/c home   Acute respiratory failure with hypoxia likely due to PE/bilateral DVTs: Initially required nonrebreather now has been weaned to room air.   Acute PE/bilateral DVTs: D-dimer greater than 20, VQ scan confirm PE lower extremity Doppler was positive for DVT. Now on Eliquis. Stable   Elevated troponins: Likely due to PE denies any chest pain.   Acute kidney injury chronic kidney see stage IIIb: With a baseline creatinine of around 1.6 on admission 3.1, now back to baseline.   Nausea vomiting and diarrhea: CT scan of the abdomen pelvis showed no acute findings now resolved.   Essential hypertension: Continue Coreg and Aldactone.   Paroxysmal atrial fibrillation: Patient is currently on Coreg and Eliquis and outpatient.   Chronic systolic heart failure: Continue Coreg and Aldactone 2D echo showed newly reduced EF of 45% will need  follow-up with cardiology as an outpatient. Continue losartan.   Hypothyroidism: Continue Synthroid.   Thrombocytopenia, Follow-up PCP as an outpatient.   History of bioprosthetic aortic valve: 2016 noted.   Morbid obesity: Noted   COVID-19 infection: Tmax 100.6, she has defervesced and has no leukocytosis no respiratory symptoms blood cultures remain negative to date. Chest x-ray showed no acute findings. Continue to monitor and treat conservatively.       Subjective: Remains feeling weak  Physical Exam: Vitals:   01/13/23 0018 01/13/23 0522 01/13/23 0802 01/13/23 1116  BP: 105/66 (!) 111/59 109/64 (!) 114/49  Pulse: 77 70 75   Resp:  16    Temp:  98 F (36.7 C) 97.9 F (36.6 C) 98.4 F (36.9 C)  TempSrc:  Oral Oral Oral  SpO2: 94% 98% 100%   Weight:  105.3 kg    Height:       General exam: Conversant, in no acute distress Respiratory system: normal chest rise, clear, no audible wheezing Cardiovascular system: regular rhythm, s1-s2 Gastrointestinal system: Nondistended, nontender, pos BS Central nervous system: No seizures, no tremors Extremities: No cyanosis, no joint deformities Skin: No rashes, no pallor Psychiatry: Affect normal // no auditory hallucinations   Data Reviewed:  Labs reviewed: Na 130, K 4.2, Cr 1.50, WBC 3.3, Hgb 11.9, Plts 79  Family Communication: Pt in room, family not at bedside  Disposition: Status is: Inpatient Remains inpatient appropriate because: severity of illness  Planned Discharge Destination: Skilled nursing facility     Author: Rickey Barbara, MD 01/13/2023 5:12 PM  For on call review [email or web address].

## 2023-01-13 NOTE — Progress Notes (Signed)
Occupational Therapy Treatment Patient Details Name: Danielle [name] MRN: [id number] DOB: 18-Feb-1946 Today's Date: 01/13/2023   History of present illness Pt is 77 year old presented to [facility] on  12/25/22 for acute respiratory failure with hypoxia and copd exacerbation. VQ scan confirms PE 10/28, age indeterminate B LE DVTs. New diagnosis of COVID-19 on  PMH - anxiety, breast cancer, COPD, CHF, CAD, HTN, and CVA with residual RUE weakness, Lt hip fx with IM nail   OT comments  OT session focused on training in techniques for increased safety and independence with functional transfers and bed mobility during/in preparation for functional tasks. Pt currently demonstrates ability to complete functional transfers with a RW with Contact guard to Min assist and bed mobility with Contact guard to Mod assist. Pt continues to requires cues for sequencing functional tasks and demonstrates poor carryover of training from session to session. New goal added this day to address sequencing ability. Pt participated well in session and is making slow but consistent progress toward goals. Pt will benefit from continued acute skilled OT services to address deficits outlined below, decrease caregiver burden, and increase safety and independence with functional tasks. Post acute discharge, pt will benefit from intensive inpatient skilled rehab services < 3 hours per day to maximize rehab potential. Once intensive inpatient rehab is complete, pt would likely be a good candidate for a PACE program.       If plan is discharge home, recommend the following:  A little help with walking and/or transfers;A lot of help with bathing/dressing/bathroom;Assistance with cooking/housework;Assist for transportation;Help with stairs or ramp for entrance;Direct supervision/assist for medications management;Direct supervision/assist for financial management   Equipment Recommendations  Other (comment) (defer to next level of  care)    Recommendations for Other Services      Precautions / Restrictions Precautions Precautions: Fall Precaution Comments: watch SpO2. HR and SOB; COVID positive Restrictions Weight Bearing Restrictions: No       Mobility Bed Mobility Overal bed mobility: Needs Assistance Bed Mobility: Sit to Supine, Rolling Rolling: Contact guard assist, Min assist     Sit to supine: Mod assist   General bed mobility comments: Assist to bring B LE into bed and for positioning of trunk; requires cues for hand placement/technique    Transfers Overall transfer level: Needs assistance Equipment used: Rolling walker (2 wheels) Transfers: Sit to/from Stand, Bed to chair/wheelchair/BSC Sit to Stand: Min assist     Step pivot transfers: Min assist, Contact guard assist     General transfer comment: Min assist to power up and cues for hand and foot placement then CGA with cues for sequencing once in standing.     Balance Overall balance assessment: Needs assistance Sitting-balance support: No upper extremity supported, Feet supported Sitting balance-Leahy Scale: Fair     Standing balance support: Bilateral upper extremity supported, During functional activity, Reliant on assistive device for balance Standing balance-Leahy Scale: Poor Standing balance comment: Reliant on RW with forward lean                           ADL either performed or assessed with clinical judgement   ADL Overall ADL's : Needs assistance/impaired Eating/Feeding: Set up;Sitting               Upper Body Dressing : Set up;Sitting       Toilet Transfer: Contact guard assist;Minimal assistance;Ambulation;Rolling walker (2 wheels);BSC/3in1;Cueing for sequencing (Min assist to power up then CGA once in standing)  Toilet Transfer Details (indicate cue type and reason): simulated from recliner to bed           General ADL Comments: Pt with improving activity tolerance but continues to be  limited by L knee pain and impaired cognition that requires frequent repetition of training.    Extremity/Trunk Assessment Upper Extremity Assessment Upper Extremity Assessment: Right hand dominant;RUE deficits/detail;LUE deficits/detail;Generalized weakness RUE Deficits / Details: generalized weakness LUE Deficits / Details: general weakness; decreased proprioception; decreased fine and gross motor coordination LUE Sensation: decreased proprioception LUE Coordination: decreased gross motor;decreased fine motor   Lower Extremity Assessment Lower Extremity Assessment: Defer to PT evaluation        Vision       Perception     Praxis      Cognition Arousal: Alert Behavior During Therapy: WFL for tasks assessed/performed, Lability (Largely WFL with occasional lability with talking about still being in the hospital and being unable to see her husband) Overall Cognitive Status: Impaired/Different from baseline (No family/caregiver present to confirm baseline) Area of Impairment: Attention, Memory, Following commands, Safety/judgement, Awareness, Problem solving                   Current Attention Level: Selective Memory: Decreased short-term memory Following Commands: Follows one step commands consistently, Follows one step commands with increased time Safety/Judgement: Decreased awareness of safety, Decreased awareness of deficits Awareness: Emergent Problem Solving: Slow processing, Difficulty sequencing, Decreased initiation, Requires verbal cues, Requires tactile cues General Comments: continues to have poor short term memory; pleasant throughout session        Exercises      Shoulder Instructions       General Comments VSS on RA throughout session    Pertinent Vitals/ Pain       Pain Assessment Pain Assessment: Faces Faces Pain Scale: Hurts whole lot Pain Location: L knee Pain Descriptors / Indicators: Discomfort, Grimacing, Guarding Pain Intervention(s):  Limited activity within patient's tolerance, Monitored during session, Repositioned, Patient requesting pain meds-RN notified, Relaxation  Home Living                                          Prior Functioning/Environment              Frequency  Min 1X/week        Progress Toward Goals  OT Goals(current goals can now be found in the care plan section)  Progress towards OT goals: Progressing toward goals;Goals updated  Acute Rehab OT Goals Patient Stated Goal: to get better and be able to go home with husband ADL Goals Pt Will Perform Lower Body Dressing: with contact guard assist;sitting/lateral leans;sit to/from stand Pt Will Transfer to Toilet: with supervision;ambulating Pt/caregiver will Perform Home Exercise Program: Increased strength;Both right and left upper extremity;With theraband;With Supervision;With written HEP provided (with cues as needed) Additional ADL Goal #1: Pt will verbalize at least 2 energy conservation strategies to implement with ADLs/mobility with handout provided. Additional ADL Goal #2: Patient will demonstrate ability to appropriately sequence a 3-step functional or therapeutic task with Mod I in 3/4 opportunities.  Plan      Co-evaluation                 AM-PAC OT "6 Clicks" Daily Activity     Outcome Measure   Help from another person eating meals?: A Little Help from another person taking care of  personal grooming?: A Little Help from another person toileting, which includes using toliet, bedpan, or urinal?: A Lot Help from another person bathing (including washing, rinsing, drying)?: A Lot Help from another person to put on and taking off regular upper body clothing?: A Little Help from another person to put on and taking off regular lower body clothing?: A Lot 6 Click Score: 15    End of Session Equipment Utilized During Treatment: Gait belt;Rolling walker (2 wheels)  OT Visit Diagnosis: Unsteadiness on feet  (R26.81);Other abnormalities of gait and mobility (R26.89);Muscle weakness (generalized) (M62.81);Pain   Activity Tolerance Patient tolerated treatment well;Patient limited by pain   Patient Left in bed;with call bell/phone within reach;with bed alarm set   Nurse Communication Mobility status;Patient requests pain meds        Time: 4401-0272 OT Time Calculation (min): 21 min  Charges: OT General Charges $OT Visit: 1 Visit OT Treatments $Self Care/Home Management : 8-22 mins  Augusta Hilbert "Orson Eva., OTR/L, MA Acute Rehab [phone]   Lendon Colonel 01/13/2023, 5:50 PM

## 2023-01-13 NOTE — Progress Notes (Signed)
Mobility Specialist Progress Note:    01/13/23 1530  Mobility  Activity Ambulated with assistance in hallway  Level of Assistance +2 (takes two people) Audiological scientist)  Press photographer wheel walker  Distance Ambulated (ft) 65 ft  Activity Response Tolerated well  Mobility Referral Yes  $Mobility charge 1 Mobility  Mobility Specialist Start Time (ACUTE ONLY) 1451  Mobility Specialist Stop Time (ACUTE ONLY) 1513  Mobility Specialist Time Calculation (min) (ACUTE ONLY) 22 min   Pt received in bed agreeable to mobility. Was able to perform bed mobility independently. Attempted to x3 stands w/o help but ended up needed a MinA +2 to stand and a contact guard during ambulation. No c/o throughout. Returned to room w/o fault. Left in chair w/ call bell and personal belongings in reach. All needs met.    Thompson Grayer Mobility Specialist  Please contact vis Secure Chat or  Rehab Office [phone]

## 2023-01-14 DIAGNOSIS — N179 Acute kidney failure, unspecified: Secondary | ICD-10-CM | POA: Diagnosis not present

## 2023-01-14 DIAGNOSIS — J9601 Acute respiratory failure with hypoxia: Secondary | ICD-10-CM | POA: Diagnosis not present

## 2023-01-14 LAB — COMPREHENSIVE METABOLIC PANEL
ALT: 18 U/L (ref 0–44)
AST: 17 U/L (ref 15–41)
Albumin: 2.6 g/dL — ABNORMAL LOW (ref 3.5–5.0)
Alkaline Phosphatase: 62 U/L (ref 38–126)
Anion gap: 7 (ref 5–15)
BUN: 15 mg/dL (ref 8–23)
CO2: 23 mmol/L (ref 22–32)
Calcium: 9.3 mg/dL (ref 8.9–10.3)
Chloride: 107 mmol/L (ref 98–111)
Creatinine, Ser: 1.23 mg/dL — ABNORMAL HIGH (ref 0.44–1.00)
GFR, Estimated: 45 mL/min — ABNORMAL LOW (ref >60)
Glucose, Bld: 90 mg/dL (ref 70–99)
Potassium: 4.5 mmol/L (ref 3.5–5.1)
Sodium: 137 mmol/L (ref 135–145)
Total Bilirubin: 0.5 mg/dL (ref <1.2)
Total Protein: 5.8 g/dL — ABNORMAL LOW (ref 6.5–8.1)

## 2023-01-14 NOTE — Progress Notes (Signed)
Occupational Therapy Treatment Patient Details Name: Danielle [name] MRN: [id number] DOB: 03-07-1945 Today's Date: 01/14/2023   History of present illness Pt is 77 year old presented to [facility] on  12/25/22 for acute respiratory failure with hypoxia and copd exacerbation. VQ scan confirms PE 10/28, age indeterminate B LE DVTs. New diagnosis of COVID-19 on  PMH - anxiety, breast cancer, COPD, CHF, CAD, HTN, and CVA with residual RUE weakness, Lt hip fx with IM nail   OT comments  STAR Patient OT/PT session: Education provided to patient on STAR programs and discuss barriers towards going home and goals. Spoke with husband and states they have lowered bed due to patient requiring a step to get into bed prior. Family also states patient has one step to get into home. Patient to be seen by STAR program to address bed mobility, functional transfers, and self care to progress to discharge home with family and HHOT to follow.       If plan is discharge home, recommend the following:  A little help with walking and/or transfers;A lot of help with bathing/dressing/bathroom;Assistance with cooking/housework;Assist for transportation;Help with stairs or ramp for entrance;Direct supervision/assist for medications management;Direct supervision/assist for financial management   Equipment Recommendations  Other (comment) (will continue to assess)    Recommendations for Other Services      Precautions / Restrictions Precautions Precautions: Fall Precaution Comments: watch SpO2. HR and SOB; COVID positive Restrictions Weight Bearing Restrictions: No       Mobility Bed Mobility Overal bed mobility: Needs Assistance Bed Mobility: Supine to Sit     Supine to sit: Contact guard, HOB elevated, Used rails     General bed mobility comments: increased time and cues throughtout    Transfers Overall transfer level: Needs assistance Equipment used: Rolling walker (2 wheels) Transfers: Sit  to/from Stand, Bed to chair/wheelchair/BSC Sit to Stand: Min assist, Mod assist, +2 physical assistance     Step pivot transfers: Min assist     General transfer comment: min to mod assist +2 to stand from EOB, min assist from recliner and BSC due to arm rests     Balance Overall balance assessment: Needs assistance Sitting-balance support: No upper extremity supported, Feet supported Sitting balance-Leahy Scale: Fair Sitting balance - Comments: EOB   Standing balance support: Single extremity supported, Bilateral upper extremity supported, During functional activity Standing balance-Leahy Scale: Poor Standing balance comment: reliant on RW for support                           ADL either performed or assessed with clinical judgement   ADL Overall ADL's : Needs assistance/impaired     Grooming: Wash/dry hands;Set up;Sitting           Upper Body Dressing : Set up;Sitting Upper Body Dressing Details (indicate cue type and reason): don gown for back     Toilet Transfer: Minimal assistance;Ambulation;Regular Toilet;BSC/3in1;Rolling walker (2 wheels) Toilet Transfer Details (indicate cue type and reason): ambulated to bathroom with RW to [facility] over commode Toileting- Clothing Manipulation and Hygiene: Moderate assistance;Sitting/lateral lean;Sit to/from stand Toileting - Clothing Manipulation Details (indicate cue type and reason): patient performed toilet hygiene seated and assistance to complete while standing            Extremity/Trunk Assessment              Vision       Perception     Praxis      Cognition Arousal:  Alert Behavior During Therapy: WFL for tasks assessed/performed Overall Cognitive Status: No family/caregiver present to determine baseline cognitive functioning Area of Impairment: Attention, Memory, Following commands, Safety/judgement, Awareness, Problem solving                   Current Attention Level:  Selective Memory: Decreased short-term memory Following Commands: Follows one step commands consistently, Follows one step commands with increased time Safety/Judgement: Decreased awareness of safety, Decreased awareness of deficits Awareness: Emergent Problem Solving: Slow processing, Difficulty sequencing, Decreased initiation, Requires verbal cues, Requires tactile cues General Comments: frequent cues for safety and walker use. Patient tearful when speaking with husband about hospice care for him        Exercises      Shoulder Instructions       General Comments discussed STAR program and expectations and goals    Pertinent Vitals/ Pain       Pain Assessment Pain Assessment: Faces Faces Pain Scale: Hurts even more Pain Location: L knee Pain Descriptors / Indicators: Discomfort, Grimacing, Guarding Pain Intervention(s): Limited activity within patient's tolerance, Monitored during session, Repositioned  Home Living                                          Prior Functioning/Environment              Frequency  Min 1X/week        Progress Toward Goals  OT Goals(current goals can now be found in the care plan section)  Progress towards OT goals: Progressing toward goals  Acute Rehab OT Goals Patient Stated Goal: go home OT Goal Formulation: With patient Time For Goal Achievement: 01/25/23 Potential to Achieve Goals: Good ADL Goals Pt Will Perform Lower Body Dressing: with contact guard assist;sitting/lateral leans;sit to/from stand Pt Will Transfer to Toilet: with supervision;ambulating Pt/caregiver will Perform Home Exercise Program: Increased strength;Both right and left upper extremity;With theraband;With Supervision;With written HEP provided Additional ADL Goal #1: Pt will verbalize at least 2 energy conservation strategies to implement with ADLs/mobility with handout provided. Additional ADL Goal #2: Patient will demonstrate ability to  appropriately sequence a 3-step functional or therapeutic task with Mod I in 3/4 opportunities.  Plan      Co-evaluation    PT/OT/SLP Co-Evaluation/Treatment: Yes Reason for Co-Treatment: To address functional/ADL transfers;Other (comment) (Initial STAR program visit) PT goals addressed during session: Mobility/safety with mobility OT goals addressed during session: ADL's and self-care      AM-PAC OT "6 Clicks" Daily Activity     Outcome Measure   Help from another person eating meals?: A Little Help from another person taking care of personal grooming?: A Little Help from another person toileting, which includes using toliet, bedpan, or urinal?: A Lot Help from another person bathing (including washing, rinsing, drying)?: A Lot Help from another person to put on and taking off regular upper body clothing?: A Little Help from another person to put on and taking off regular lower body clothing?: A Lot 6 Click Score: 15    End of Session Equipment Utilized During Treatment: Gait belt;Rolling walker (2 wheels)  OT Visit Diagnosis: Unsteadiness on feet (R26.81);Other abnormalities of gait and mobility (R26.89);Muscle weakness (generalized) (M62.81);Pain Pain - Right/Left: Left Pain - part of body: Knee   Activity Tolerance Patient tolerated treatment well;Patient limited by pain   Patient Left in chair;with call bell/phone within reach;with chair alarm  set   Nurse Communication Mobility status        Time: 1331-1436 OT Time Calculation (min): 65 min  Charges: OT General Charges $OT Visit: 1 Visit OT Treatments $Self Care/Home Management : 23-37 mins  Alfonse Flavors, OTA Acute Rehabilitation Services  Office [phone]   Dewain Penning 01/14/2023, 2:58 PM

## 2023-01-14 NOTE — Progress Notes (Signed)
Physical Therapy Treatment Patient Details Name: Danielle [name] MRN: [id number] DOB: [birth year] Today's Date: 01/14/2023   History of Present Illness Pt is 77 year old presented to [facility] on  12/25/22 for acute respiratory failure with hypoxia and copd exacerbation. VQ scan confirms PE 10/28, age indeterminate B LE DVTs. New diagnosis of COVID-19 on  PMH - anxiety, breast cancer, COPD, CHF, CAD, HTN, and CVA with residual RUE weakness, Lt hip fx with IM nail    PT Comments  STAR PT/OT Session: Introduced STAR program to patient and that we would be seeing her once for PT and once for OT. Pt eager to get home. Discussed obstacles that pt would have in going home like bed she has to use a step to get into and getting in and out of flattened bed, placement of BSC in her bedroom. Pt is in agreement. Called pt husband during session. Husband reported that Hospice RN coming for evaluation this afternoon. Pt became very emotional. Educated on various services that Hospice offer. Pt's mother in-law also on phone reporting that pt needs to come home soon. Family is trying to cobble together care for pt and her husband from family members but are having difficulty and will benefit from any services available HHAide, HHPT and HHOT. Pt with 2 trips to bathroom having increased difficulty with navigating in small bathroom. Pt agreeable to [facility] in her bedroom. Pt is needing min-modAx2 for transfers and ambulation in RW. Pt reports she has wheelchair at home. Will work on wheelchair level mobility in addition to ambulation. PT/OT will continue to work with pt daily starting Monday.    If plan is discharge home, recommend the following: A little help with walking and/or transfers;A little help with bathing/dressing/bathroom;Assistance with cooking/housework;Assist for transportation   Can travel by private vehicle     No  Equipment Recommendations  None recommended by PT       Precautions /  Restrictions Precautions Precautions: Fall Precaution Comments: watch SpO2. HR and SOB; COVID positive Restrictions Weight Bearing Restrictions: No     Mobility  Bed Mobility Overal bed mobility: Needs Assistance Bed Mobility: Supine to Sit     Supine to sit: Contact guard, HOB elevated, Used rails     General bed mobility comments: increased time and cues throughtout    Transfers Overall transfer level: Needs assistance Equipment used: Rolling walker (2 wheels) Transfers: Sit to/from Stand, Bed to chair/wheelchair/BSC Sit to Stand: Min assist, Mod assist, +2 physical assistance   Step pivot transfers: Min assist       General transfer comment: min to mod assist +2 to stand from EOB, min assist from recliner and BSC due to arm rests    Ambulation/Gait Ambulation/Gait assistance: +2 safety/equipment, Min assist Gait Distance (Feet): 15 Feet (+8, +8) Assistive device: Rolling walker (2 wheels) Gait Pattern/deviations: Step-to pattern, Decreased step length - right, Decreased step length - left, Trunk flexed, Antalgic, Knee flexed in stance - left, Decreased weight shift to left, Decreased stance time - left Gait velocity: decr Gait velocity interpretation: <1.31 ft/sec, indicative of household ambulator   General Gait Details: minAx2 for steadying with gait into and out of bathroom x 2, maximal cuing for proximity to RW and upright posture     Balance Overall balance assessment: Needs assistance Sitting-balance support: No upper extremity supported, Feet supported Sitting balance-Leahy Scale: Fair Sitting balance - Comments: EOB   Standing balance support: Single extremity supported, Bilateral upper extremity supported, During functional activity Standing balance-Leahy Scale:  Poor Standing balance comment: reliant on RW for support                            Cognition Arousal: Alert Behavior During Therapy: WFL for tasks assessed/performed Overall  Cognitive Status: No family/caregiver present to determine baseline cognitive functioning Area of Impairment: Attention, Memory, Following commands, Safety/judgement, Awareness, Problem solving                   Current Attention Level: Selective Memory: Decreased short-term memory Following Commands: Follows one step commands consistently, Follows one step commands with increased time Safety/Judgement: Decreased awareness of safety, Decreased awareness of deficits Awareness: Emergent Problem Solving: Slow processing, Difficulty sequencing, Decreased initiation, Requires verbal cues, Requires tactile cues General Comments: frequent cues for safety and walker use. Patient tearful when speaking with husband about hospice care for him           General Comments General comments (skin integrity, edema, etc.): discussed STAR program and expectations and goals, spoke with husband who reports Hospice RN coming for consultation this afternoon, pt burst into tears, pt decreased cognition made it difficult to explain services, pt reports she wants to go home.      Pertinent Vitals/Pain Pain Assessment Pain Assessment: Faces Faces Pain Scale: Hurts even more Pain Location: L knee Pain Descriptors / Indicators: Discomfort, Grimacing, Guarding Pain Intervention(s): Limited activity within patient's tolerance, Monitored during session, Repositioned     PT Goals (current goals can now be found in the care plan section) Acute Rehab PT Goals PT Goal Formulation: With patient Time For Goal Achievement: 01/21/23 Potential to Achieve Goals: Fair Progress towards PT goals: Progressing toward goals    Frequency    Min 1X/week           Co-evaluation   Reason for Co-Treatment: To address functional/ADL transfers;Other (comment) (Initial STAR program visit) PT goals addressed during session: Mobility/safety with mobility OT goals addressed during session: ADL's and self-care       AM-PAC PT "6 Clicks" Mobility   Outcome Measure  Help needed turning from your back to your side while in a flat bed without using bedrails?: A Little Help needed moving from lying on your back to sitting on the side of a flat bed without using bedrails?: A Little Help needed moving to and from a bed to a chair (including a wheelchair)?: A Lot Help needed standing up from a chair using your arms (e.g., wheelchair or bedside chair)?: A Little Help needed to walk in hospital room?: Total Help needed climbing 3-5 steps with a railing? : A Lot 6 Click Score: 14    End of Session Equipment Utilized During Treatment: Gait belt Activity Tolerance: Patient limited by fatigue Patient left: with call bell/phone within reach;in chair;with chair alarm set Nurse Communication: Mobility status PT Visit Diagnosis: Other abnormalities of gait and mobility (R26.89);Muscle weakness (generalized) (M62.81);History of falling (Z91.81)     Time: 4696-2952 PT Time Calculation (min) (ACUTE ONLY): 65 min  Charges:    $Gait Training: 8-22 mins $Therapeutic Activity: 8-22 mins PT General Charges $$ ACUTE PT VISIT: 1 Visit                     Allura Doepke B. Beverely Risen PT, DPT Acute Rehabilitation Services Please use secure chat or  Call Office [phone]    Elon Alas [facility] 01/14/2023, 3:22 PM

## 2023-01-14 NOTE — Progress Notes (Signed)
  Progress Note   Patient: Danielle [name] LKG:[id number] DOB: 07-01-1945 DOA: 12/25/2022     20 DOS: the patient was seen and examined on 01/14/2023   Brief hospital course: 77 y.o. female past medical history significant for essential hypertension, hyperlipidemia systolic heart failure, paroxysmal atrial fibrillation on Eliquis presented to ED for shortness of breath and hypoxia found to be in sepsis probably due to pneumonia, there was also concern for PE VQ scan was done on 12/27/2022 that confirms a PE   Assessment and Plan: Severe sepsis possibly due to pneumonia: Empirically on antibiotics blood cultures have been negative till date. Completed course of antibiotics in house. Mechanical fall/left knee pain/osteoarthritis: Fell on 12/31/2022 slid down to the ground no significant trauma. X-ray on films so severe degenerative disease will need to follow-up with orthopedic surgery as an outpatient. Discussed with TOC. SNF was recommended by therapy initially. Plan now for pt to continue with [facility] program for pt to improve enough to d/c home. Will follow up with PT   Acute respiratory failure with hypoxia likely due to PE/bilateral DVTs: Initially required nonrebreather now has been weaned to room air.   Acute PE/bilateral DVTs: D-dimer greater than 20, VQ scan confirm PE lower extremity Doppler was positive for DVT. Now on Eliquis. Stable   Elevated troponins: Likely due to PE denies any chest pain.   Acute kidney injury chronic kidney see stage IIIb: With a baseline creatinine of around 1.6 on admission 3.1, now back to baseline.   Nausea vomiting and diarrhea: CT scan of the abdomen pelvis showed no acute findings now resolved.   Essential hypertension: Continue Coreg and Aldactone.   Paroxysmal atrial fibrillation: Patient is currently on Coreg and Eliquis and outpatient.   Chronic systolic heart failure: Continue Coreg and Aldactone 2D echo showed newly  reduced EF of 45% will need follow-up with cardiology as an outpatient. Continue losartan.   Hypothyroidism: Continue Synthroid.   Thrombocytopenia, Follow-up PCP as an outpatient.   History of bioprosthetic aortic valve: 2016 noted.   Morbid obesity: Noted   COVID-19 infection: Pt tested pos for COVID 01/09/23 Tmax 100.6, she has defervesced and has no leukocytosis no respiratory symptoms blood cultures remain negative to date. Chest x-ray showed no acute findings. Continue to monitor and treat conservatively.      Subjective: Claims to be getting stronger with PT here  Physical Exam: Vitals:   01/13/23 1935 01/13/23 2027 01/14/23 0440 01/14/23 1643  BP: 100/72  (!) 108/90 109/64  Pulse: 61 80 75 89  Resp: 18 18 20 16   Temp: 98.6 F (37 C)  98.2 F (36.8 C) 98.2 F (36.8 C)  TempSrc: Oral  Oral Oral  SpO2: 92% 95% 98% 100%  Weight:      Height:       General exam: Awake, laying in bed, in nad Respiratory system: Normal respiratory effort, no wheezing Cardiovascular system: regular rate, s1, s2 Gastrointestinal system: Soft, nondistended, positive BS Central nervous system: CN2-12 grossly intact, strength intact Extremities: Perfused, no clubbing Skin: Normal skin turgor, no notable skin lesions seen Psychiatry: Mood normal // no visual hallucinations   Data Reviewed:  Labs reviewed: Na 137, K 4.5, Cr 1.23  Family Communication: Pt in room, family not at bedside  Disposition: Status is: Inpatient Remains inpatient appropriate because: severity of illness  Planned Discharge Destination: Home with Home Health    Author: Rickey Barbara, MD 01/14/2023 4:53 PM  For on call review [email or web address].

## 2023-01-15 DIAGNOSIS — N179 Acute kidney failure, unspecified: Secondary | ICD-10-CM | POA: Diagnosis not present

## 2023-01-15 DIAGNOSIS — J9601 Acute respiratory failure with hypoxia: Secondary | ICD-10-CM | POA: Diagnosis not present

## 2023-01-15 NOTE — Progress Notes (Signed)
Progress Note   Patient: Danielle [name] ZOX:[id number] DOB: [birth year] DOA: 12/25/2022     21 DOS: the patient was seen and examined on 01/15/2023   Brief hospital course: 77 y.o. female past medical history significant for essential hypertension, hyperlipidemia systolic heart failure, paroxysmal atrial fibrillation on Eliquis presented to ED for shortness of breath and hypoxia found to be in sepsis probably due to pneumonia, there was also concern for PE VQ scan was done on 12/27/2022 that confirms a PE   Assessment and Plan: Severe sepsis possibly due to pneumonia: Empirically on antibiotics blood cultures have been negative till date. Completed course of antibiotics in house. Mechanical fall/left knee pain/osteoarthritis: Fell on 12/31/2022 slid down to the ground no significant trauma. X-ray on films so severe degenerative disease will need to follow-up with orthopedic surgery as an outpatient. D/c plan was for pt to continue with [facility] program for pt to improve enough to d/c home. However, there are apparently issues at home such that pt may not have help at home as originally thought. D/c planning continues   Acute respiratory failure with hypoxia likely due to PE/bilateral DVTs: Initially required nonrebreather now has been weaned to room air.   Acute PE/bilateral DVTs: D-dimer greater than 20, VQ scan confirm PE lower extremity Doppler was positive for DVT. Now on Eliquis. Stable   Elevated troponins: Likely due to PE denies any chest pain.   Acute kidney injury chronic kidney see stage IIIb: With a baseline creatinine of around 1.6 on admission 3.1, now back to baseline.   Nausea vomiting and diarrhea: CT scan of the abdomen pelvis showed no acute findings now resolved.   Essential hypertension: Continue Coreg and Aldactone.   Paroxysmal atrial fibrillation: Patient is currently on Coreg and Eliquis and outpatient.   Chronic systolic heart  failure: Continue Coreg and Aldactone 2D echo showed newly reduced EF of 45% will need follow-up with cardiology as an outpatient. Continue losartan.   Hypothyroidism: Continue Synthroid.   Thrombocytopenia, Follow-up PCP as an outpatient.   History of bioprosthetic aortic valve: 2016 noted.   Morbid obesity: Noted   COVID-19 infection: Pt tested pos for COVID 01/09/23 Tmax 100.6, she has defervesced and has no leukocytosis no respiratory symptoms blood cultures remain negative to date. Chest x-ray showed no acute findings. Continue to monitor and treat conservatively.      Subjective: Very eager to go home, to the point of tears  Physical Exam: Vitals:   01/15/23 0432 01/15/23 0500 01/15/23 0744 01/15/23 1424  BP: 101/69   (!) 105/58  Pulse: 77     Resp: 20   18  Temp: 98.6 F (37 C)   (!) 97.5 F (36.4 C)  TempSrc: Oral   Oral  SpO2: 96%  96%   Weight:  105.6 kg    Height:       General exam: Conversant, in no acute distress Respiratory system: normal chest rise, clear, no audible wheezing Cardiovascular system: regular rhythm, s1-s2 Gastrointestinal system: Nondistended, nontender, pos BS Central nervous system: No seizures, no tremors Extremities: No cyanosis, no joint deformities Skin: No rashes, no pallor Psychiatry: Affect normal // no auditory hallucinations   Data Reviewed:  There are no new results to review at this time.  Family Communication: Pt in room, family not at bedside  Disposition: Status is: Inpatient Remains inpatient appropriate because: severity of illness  Planned Discharge Destination:  Unclear at this time    Author: Rickey Barbara, MD 01/15/2023 5:25 PM  For  on call review [email or web address].

## 2023-01-15 NOTE — Progress Notes (Signed)
Mobility Specialist Progress Note    01/15/23 1221  Mobility  Activity Ambulated with assistance in room  Level of Assistance Minimal assist, patient does 75% or more  Assistive Device Front wheel walker  Distance Ambulated (ft) 32 ft (14+14+5)  Activity Response Tolerated well  Mobility Referral Yes  $Mobility charge 1 Mobility  Mobility Specialist Start Time (ACUTE ONLY) 1152  Mobility Specialist Stop Time (ACUTE ONLY) 1218  Mobility Specialist Time Calculation (min) (ACUTE ONLY) 26 min   Pt received in bed and agreeable. No complaints. Pt took x2 seated rest breaks. Left in chair at sink with call bell in reach. RN aware.    [place] Mobility Specialist  Please Neurosurgeon or Rehab Office at [phone]

## 2023-01-15 NOTE — Plan of Care (Signed)
  Problem: Education: Goal: Knowledge of General Education information will improve Description: Including pain rating scale, medication(s)/side effects and non-pharmacologic comfort measures Outcome: Progressing   Problem: Health Behavior/Discharge Planning: Goal: Ability to manage health-related needs will improve Outcome: Progressing   Problem: Clinical Measurements: Goal: Ability to maintain clinical measurements within normal limits will improve Outcome: Progressing Goal: Will remain free from infection Outcome: Progressing Goal: Diagnostic test results will improve Outcome: Progressing Goal: Respiratory complications will improve Outcome: Progressing Goal: Cardiovascular complication will be avoided Outcome: Progressing   Problem: Activity: Goal: Risk for activity intolerance will decrease Outcome: Progressing   Problem: Nutrition: Goal: Adequate nutrition will be maintained Outcome: Progressing   Problem: Coping: Goal: Level of anxiety will decrease Outcome: Progressing   Problem: Elimination: Goal: Will not experience complications related to bowel motility Outcome: Progressing Goal: Will not experience complications related to urinary retention Outcome: Progressing   Problem: Pain Management: Goal: General experience of comfort will improve Outcome: Progressing   Problem: Safety: Goal: Ability to remain free from injury will improve Outcome: Progressing   Problem: Skin Integrity: Goal: Risk for impaired skin integrity will decrease Outcome: Progressing   Problem: Education: Goal: Knowledge of risk factors and measures for prevention of condition will improve Outcome: Progressing   Problem: Coping: Goal: Psychosocial and spiritual needs will be supported Outcome: Progressing   Problem: Respiratory: Goal: Will maintain a patent airway Outcome: Progressing Goal: Complications related to the disease process, condition or treatment will be avoided or  minimized Outcome: Progressing

## 2023-01-16 DIAGNOSIS — J9601 Acute respiratory failure with hypoxia: Secondary | ICD-10-CM | POA: Diagnosis not present

## 2023-01-16 DIAGNOSIS — N179 Acute kidney failure, unspecified: Secondary | ICD-10-CM | POA: Diagnosis not present

## 2023-01-16 NOTE — Progress Notes (Signed)
Progress Note   Patient: Danielle [name] UEA:[id number] DOB: December 14, 1945 DOA: 12/25/2022     22 DOS: the patient was seen and examined on 01/16/2023   Brief hospital course: 77 y.o. female past medical history significant for essential hypertension, hyperlipidemia systolic heart failure, paroxysmal atrial fibrillation on Eliquis presented to ED for shortness of breath and hypoxia found to be in sepsis probably due to pneumonia, there was also concern for PE VQ scan was done on 12/27/2022 that confirms a PE   Assessment and Plan: Severe sepsis possibly due to pneumonia: Empirically on antibiotics blood cultures have been negative till date. Completed course of antibiotics in house. Mechanical fall/left knee pain/osteoarthritis: Fell on 12/31/2022 slid down to the ground no significant trauma. X-ray on films so severe degenerative disease will need to follow-up with orthopedic surgery as an outpatient. D/c plan was for pt to continue with [facility] program for pt to improve enough to d/c home. However, there are apparently issues at home such that pt may not have help at home as originally thought. Discharge planning continues   Acute respiratory failure with hypoxia likely due to PE/bilateral DVTs: Initially required nonrebreather now has been weaned to room air.   Acute PE/bilateral DVTs: D-dimer greater than 20, VQ scan confirm PE lower extremity Doppler was positive for DVT. Now on Eliquis. Stable   Elevated troponins: Likely due to PE denies any chest pain.   Acute kidney injury chronic kidney see stage IIIb: With a baseline creatinine of around 1.6 on admission 3.1, now back to baseline.   Nausea vomiting and diarrhea: CT scan of the abdomen pelvis showed no acute findings now resolved.   Essential hypertension: Continue Coreg and Aldactone.   Paroxysmal atrial fibrillation: Patient is currently on Coreg and Eliquis and outpatient.   Chronic systolic heart  failure: Continue Coreg and Aldactone 2D echo showed newly reduced EF of 45% will need follow-up with cardiology as an outpatient. Continue losartan.   Hypothyroidism: Continue Synthroid.   Thrombocytopenia, Follow-up PCP as an outpatient.   History of bioprosthetic aortic valve: 2016 noted.   Morbid obesity: Noted   COVID-19 infection: Pt tested pos for COVID 01/09/23 Tmax 100.6, she has defervesced and has no leukocytosis no respiratory symptoms blood cultures remain negative to date. Chest x-ray showed no acute findings. Continue to monitor and treat conservatively.      Subjective: Claims to be getting stronger with therapy  Physical Exam: Vitals:   01/16/23 0508 01/16/23 0916 01/16/23 1048 01/16/23 1049  BP: 100/70   (!) 93/57  Pulse: 74   81  Resp: 20   19  Temp: 98 F (36.7 C)   97.8 F (36.6 C)  TempSrc: Oral  Oral Oral  SpO2: 96% 95%  100%  Weight:      Height:       General exam: Awake, laying in bed, in nad Respiratory system: Normal respiratory effort, no wheezing Cardiovascular system: regular rate, s1, s2 Gastrointestinal system: Soft, nondistended, positive BS Central nervous system: CN2-12 grossly intact, strength intact Extremities: Perfused, no clubbing Skin: Normal skin turgor, no notable skin lesions seen Psychiatry: Mood normal // no visual hallucinations   Data Reviewed:  There are no new results to review at this time.  Family Communication: Pt in room, family not at bedside  Disposition: Status is: Inpatient Remains inpatient appropriate because: severity of illness  Planned Discharge Destination:  Unclear at this time    Author: Rickey Barbara, MD 01/16/2023 3:44 PM  For on call  review [email or web address].

## 2023-01-16 NOTE — Progress Notes (Signed)
Mobility Specialist Progress Note    01/16/23 1248  Mobility  Activity Ambulated with assistance in room  Level of Assistance Minimal assist, patient does 75% or more  Assistive Device Front wheel walker  Distance Ambulated (ft) 55 ft (45+10)  Activity Response Tolerated well  Mobility Referral Yes  $Mobility charge 1 Mobility  Mobility Specialist Start Time (ACUTE ONLY) 1226  Mobility Specialist Stop Time (ACUTE ONLY) 1246  Mobility Specialist Time Calculation (min) (ACUTE ONLY) 20 min   Pt received in bed and agreeable. No complaints. Took x1 seated rest break. Returned to sitting EOB with call bell in reach and bed alarm on. RN and NT aware.   [place] Mobility Specialist  Please Neurosurgeon or Rehab Office at [phone]

## 2023-01-17 DIAGNOSIS — J9601 Acute respiratory failure with hypoxia: Secondary | ICD-10-CM | POA: Diagnosis not present

## 2023-01-17 DIAGNOSIS — N179 Acute kidney failure, unspecified: Secondary | ICD-10-CM | POA: Diagnosis not present

## 2023-01-17 LAB — COMPREHENSIVE METABOLIC PANEL
ALT: UNDETERMINED U/L (ref 0–44)
ALT: 20 U/L (ref 0–44)
AST: 17 U/L (ref 15–41)
AST: 20 U/L (ref 15–41)
Albumin: 2.7 g/dL — ABNORMAL LOW (ref 3.5–5.0)
Albumin: 3.2 g/dL — ABNORMAL LOW (ref 3.5–5.0)
Alkaline Phosphatase: 70 U/L (ref 38–126)
Alkaline Phosphatase: 82 U/L (ref 38–126)
Anion gap: 7 (ref 5–15)
Anion gap: 8 (ref 5–15)
BUN: 16 mg/dL (ref 8–23)
BUN: 15 mg/dL (ref 8–23)
CO2: 18 mmol/L — ABNORMAL LOW (ref 22–32)
CO2: 21 mmol/L — ABNORMAL LOW (ref 22–32)
Calcium: 9.2 mg/dL (ref 8.9–10.3)
Calcium: 9.9 mg/dL (ref 8.9–10.3)
Chloride: 108 mmol/L (ref 98–111)
Chloride: 106 mmol/L (ref 98–111)
Creatinine, Ser: 1.7 mg/dL — ABNORMAL HIGH (ref 0.44–1.00)
Creatinine, Ser: 1.5 mg/dL — ABNORMAL HIGH (ref 0.44–1.00)
GFR, Estimated: 31 mL/min — ABNORMAL LOW (ref >60)
GFR, Estimated: 36 mL/min — ABNORMAL LOW (ref >60)
Glucose, Bld: 93 mg/dL (ref 70–99)
Glucose, Bld: 101 mg/dL — ABNORMAL HIGH (ref 70–99)
Potassium: 4.8 mmol/L (ref 3.5–5.1)
Potassium: 4.1 mmol/L (ref 3.5–5.1)
Sodium: 133 mmol/L — ABNORMAL LOW (ref 135–145)
Sodium: 135 mmol/L (ref 135–145)
Total Bilirubin: UNDETERMINED mg/dL (ref <1.2)
Total Bilirubin: 0.5 mg/dL (ref <1.2)
Total Protein: 5.9 g/dL — ABNORMAL LOW (ref 6.5–8.1)
Total Protein: 7 g/dL (ref 6.5–8.1)

## 2023-01-17 LAB — CBC
HCT: 39.7 % (ref 36.0–46.0)
Hemoglobin: 12.5 g/dL (ref 12.0–15.0)
MCH: 31.4 pg (ref 26.0–34.0)
MCHC: 31.5 g/dL (ref 30.0–36.0)
MCV: 99.7 fL (ref 80.0–100.0)
Platelets: 81 10*3/uL — ABNORMAL LOW (ref 150–400)
RBC: 3.98 MIL/uL (ref 3.87–5.11)
RDW: 13.7 % (ref 11.5–15.5)
WBC: 5.3 10*3/uL (ref 4.0–10.5)
nRBC: 0 % (ref 0.0–0.2)

## 2023-01-17 NOTE — TOC Progression Note (Addendum)
Transition of Care [facility]) - Progression Note    Patient Details  Name: Danielle [name] MRN: [id number] Date of Birth: [birth year]  [facility] CM/SW Contact  Delilah Shan, LCSWA Phone Number: 01/17/2023, 3:43 PM  Clinical Narrative:     CSW spoke with patient to see if it was okay to reach out to another facility that offered and see if they can try to get insurance authorization for patient. Patient gave permission for CSW to reach out to [facility] to see if they could get insurance auth for SNF patient. CSW spoke with [place] with [facility]. CSW resent referral to [facility] for review and Olegario Messier confirmed [facility] will try to start insurance authorization for SNF tomorrow. CSW will continue to follow.  Expected Discharge Plan: Skilled Nursing Facility Barriers to Discharge: Continued Medical Work up  Expected Discharge Plan and Services In-house Referral: Clinical Social Work Discharge Planning Services: CM Consult Post Acute Care Choice: Skilled Nursing Facility Living arrangements for the past 2 months: Single Family Home                           HH Arranged: PT, OT HH Agency: [facility] Date [facility] Agency Contacted: 12/27/22 Time HH Agency Contacted: 1423 Representative spoke with at [facility] Agency: Kandee Keen   Social Determinants of Health (SDOH) Interventions SDOH Screenings   Food Insecurity: No Food Insecurity (12/25/2022)  Housing: Low Risk  (12/25/2022)  Transportation Needs: No Transportation Needs (12/25/2022)  Utilities: Not At Risk (12/25/2022)  Alcohol Screen: Low Risk  (02/01/2022)  Depression (PHQ2-9): Low Risk  (06/04/2022)  Recent Concern: Depression (PHQ2-9) - Medium Risk (04/09/2022)  Financial Resource Strain: Low Risk  (02/01/2022)  Recent Concern: Financial Resource Strain - Medium Risk (01/25/2022)  Physical Activity: Inactive (02/01/2022)  Social Connections: Socially Integrated (02/01/2022)  Stress: No Stress Concern Present (02/01/2022)   Tobacco Use: Medium Risk (12/27/2022)    Readmission Risk Interventions     No data to display

## 2023-01-17 NOTE — Progress Notes (Signed)
Physical Therapy Treatment Patient Details Name: Danielle [name] MRN: [id number] DOB: [birth year] Today's Date: 01/17/2023   History of Present Illness Pt is 77 year old presented to [facility] on  12/25/22 for acute respiratory failure with hypoxia and copd exacerbation. VQ scan confirms PE 10/28, age indeterminate B LE DVTs. New diagnosis of COVID-19 on  PMH - anxiety, breast cancer, COPD, CHF, CAD, HTN, and CVA with residual RUE weakness, Lt hip fx with IM nail    PT Comments  [facility] PT Session: Pt sitting up in recliner on entry. Happy to see Mobility Specialist and agreeable to try stair training. Pt requires minAx2 initially to stand from recliner. Walked 4 feet to single portable step and unable to get R foot fully onto step, so initially practiced single step taps to step with R foot x10. Pt needing seated rest break. Stepped back to recliner and sat back hard in the recliner without reaching back to chair. When asked about how she sat down she stated "I plopped", pt able to tell PT what proper sequencing to sit was. After resting stood and practiced 10 step taps with R foot and 5 with L foot. Pt able to perform more controled descent with subsequent rest break. On third attempt with modAx2 pt able to step up onto step but is nervous due to L knee buckling. Able to step back to ground with modAx2. After seated rest break pt able to perform 2 bouts of ambulation ~15 feet with close chair follow for seated rest break. Constant multimodal cuing for upright posture and safe proximity to RW. PT continues to recommend inpatient follow up therapy, <3 hours/day. Pt will be seen daily as part of the [facility].      If plan is discharge home, recommend the following: A little help with walking and/or transfers;A little help with bathing/dressing/bathroom;Assistance with cooking/housework;Assist for transportation   Can travel by private vehicle     No  Equipment Recommendations  None  recommended by PT       Precautions / Restrictions Precautions Precautions: Fall Precaution Comments: watch SpO2. HR and SOB; COVID positive Restrictions Weight Bearing Restrictions: No     Mobility  Bed Mobility               General bed mobility comments: up in recliner on entry, agreeable to try to step up on portable step    Transfers Overall transfer level: Needs assistance Equipment used: Rolling walker (2 wheels) Transfers: Sit to/from Stand, Bed to chair/wheelchair/BSC Sit to Stand: Min assist, +2 physical assistance, Contact guard assist           General transfer comment: initally contact guard physical assist but maximal cuing for sequencing powering up to RW, requires cuing for eccentric control with sitting, with additional attempts at standing throughout session pt with better sequencing but requring minAx2 for steadying with power up to RW.    Ambulation/Gait Ambulation/Gait assistance: +2 safety/equipment, Min assist Gait Distance (Feet): 15 Feet (x2, with close chair follow for seated rest break) Assistive device: Rolling walker (2 wheels) Gait Pattern/deviations: Step-to pattern, Decreased step length - right, Decreased step length - left, Trunk flexed, Antalgic, Knee flexed in stance - left, Decreased weight shift to left, Decreased stance time - left Gait velocity: decr Gait velocity interpretation: <1.31 ft/sec, indicative of household ambulator   General Gait Details: min physical A and second person for close chair follow, constant cuing for upright posture, proximity to RW, use of UE support and increased  BoS with fatigue   Stairs Stairs: Yes Stairs assistance: Mod assist, +2 physical assistance Stair Management: Forwards Number of Stairs: 1 General stair comments: requires modAx2 for power up to step with pt utilizing bilateral UE support on RW, pt with increased fear of L knee buckling with attempt to bring it up to step, pt requiring  modAx2 for support with stepping backwards down step and immediately sitting in recliner         Balance Overall balance assessment: Needs assistance Sitting-balance support: No upper extremity supported, Feet supported Sitting balance-Leahy Scale: Fair Sitting balance - Comments: EOB   Standing balance support: Single extremity supported, Bilateral upper extremity supported, During functional activity Standing balance-Leahy Scale: Poor Standing balance comment: reliant on RW for support                            Cognition Arousal: Alert Behavior During Therapy: WFL for tasks assessed/performed Overall Cognitive Status: No family/caregiver present to determine baseline cognitive functioning Area of Impairment: Attention, Memory, Following commands, Safety/judgement, Awareness, Problem solving                   Current Attention Level: Selective Memory: Decreased short-term memory Following Commands: Follows one step commands consistently, Follows one step commands with increased time Safety/Judgement: Decreased awareness of safety, Decreased awareness of deficits Awareness: Emergent Problem Solving: Slow processing, Difficulty sequencing, Decreased initiation, Requires verbal cues, Requires tactile cues General Comments: continues to have poor carryover, requires cuing to come to proper sequencing        Exercises Other Exercises Other Exercises: step up with R foot x 10, 3 bouts Other Exercises: step up with L foot x5, x2 bouts    General Comments General comments (skin integrity, edema, etc.): VSS on RA      Pertinent Vitals/Pain Pain Assessment Pain Assessment: Faces Faces Pain Scale: Hurts even more Pain Location: L knee especially with attempts for stair climbing Pain Descriptors / Indicators: Discomfort, Grimacing, Guarding Pain Intervention(s): Limited activity within patient's tolerance, Monitored during session, Repositioned     PT  Goals (current goals can now be found in the care plan section) Acute Rehab PT Goals PT Goal Formulation: With patient Time For Goal Achievement: 01/21/23 Potential to Achieve Goals: Fair Progress towards PT goals: Progressing toward goals    Frequency    Min 1X/week           Co-evaluation   Reason for Co-Treatment: To address functional/ADL transfers;Other (comment) (Initial [facility] program visit) PT goals addressed during session: Mobility/safety with mobility OT goals addressed during session: ADL's and self-care      AM-PAC PT "6 Clicks" Mobility   Outcome Measure  Help needed turning from your back to your side while in a flat bed without using bedrails?: A Little Help needed moving from lying on your back to sitting on the side of a flat bed without using bedrails?: A Little Help needed moving to and from a bed to a chair (including a wheelchair)?: A Lot Help needed standing up from a chair using your arms (e.g., wheelchair or bedside chair)?: A Little Help needed to walk in hospital room?: Total Help needed climbing 3-5 steps with a railing? : A Lot 6 Click Score: 14    End of Session Equipment Utilized During Treatment: Gait belt Activity Tolerance: Patient limited by fatigue Patient left: with call bell/phone within reach;in chair;with chair alarm set Nurse Communication: Mobility status PT Visit  Diagnosis: Other abnormalities of gait and mobility (R26.89);Muscle weakness (generalized) (M62.81);History of falling (Z91.81)     Time: 1610-9604 PT Time Calculation (min) (ACUTE ONLY): 42 min  Charges:    $Gait Training: 23-37 mins $Therapeutic Exercise: 8-22 mins PT General Charges $$ ACUTE PT VISIT: 1 Visit                     Lorrayne Ismael B. Beverely Risen PT, DPT Acute Rehabilitation Services Please use secure chat or  Call Office [phone]    Elon Alas [facility] 01/17/2023, 5:14 PM

## 2023-01-17 NOTE — Progress Notes (Signed)
  Progress Note   Patient: Danielle [name] ZHY:[id number] DOB: Dec 18, 1945 DOA: 12/25/2022     23 DOS: the patient was seen and examined on 01/17/2023   Brief hospital course: 77 y.o. female past medical history significant for essential hypertension, hyperlipidemia systolic heart failure, paroxysmal atrial fibrillation on Eliquis presented to ED for shortness of breath and hypoxia found to be in sepsis probably due to pneumonia, there was also concern for PE VQ scan was done on 12/27/2022 that confirms a PE   Assessment and Plan: Severe sepsis possibly due to pneumonia: Empirically on antibiotics blood cultures have been negative till date. Completed course of antibiotics in house. Mechanical fall/left knee pain/osteoarthritis: Fell on 12/31/2022 slid down to the ground no significant trauma. X-ray on films so severe degenerative disease will need to follow-up with orthopedic surgery as an outpatient. D/c plan was for pt to continue with STAR program for pt to improve enough to d/c. Discussed with PT and TOC. Unable to safely discharge home at this time given lack of appropriate help at home    Acute respiratory failure with hypoxia likely due to PE/bilateral DVTs: Initially required nonrebreather now has been weaned to room air.   Acute PE/bilateral DVTs: D-dimer greater than 20, VQ scan confirm PE lower extremity Doppler was positive for DVT. Now on Eliquis. Stable   Elevated troponins: Likely due to PE denies any chest pain.   Acute kidney injury chronic kidney see stage IIIb: With a baseline creatinine of around 1.6 on admission 3.1, now back to baseline.   Nausea vomiting and diarrhea: CT scan of the abdomen pelvis showed no acute findings now resolved.   Essential hypertension: Continue Coreg and Aldactone.   Paroxysmal atrial fibrillation: Patient is currently on Coreg and Eliquis and outpatient.   Chronic systolic heart failure: Continue Coreg and Aldactone  2D echo showed newly reduced EF of 45% will need follow-up with cardiology as an outpatient. Continue losartan.   Hypothyroidism: Continue Synthroid.   Thrombocytopenia, Follow-up PCP as an outpatient.   History of bioprosthetic aortic valve: 2016 noted.   Morbid obesity: Noted   COVID-19 infection: Pt tested pos for COVID 01/09/23 Tmax 100.6, she has defervesced and has no leukocytosis no respiratory symptoms blood cultures remain negative to date. Chest x-ray showed no acute findings. Continue to monitor and treat conservatively.      Subjective: Very eager to go home  Physical Exam: Vitals:   01/17/23 0813 01/17/23 0837 01/17/23 1715 01/17/23 1721  BP: 119/77  108/77 108/77  Pulse: 77  76 76  Resp:   18   Temp:   99.1 F (37.3 C)   TempSrc:   Oral   SpO2:  97% 97%   Weight:      Height:       General exam: Conversant, in no acute distress Respiratory system: normal chest rise, clear, no audible wheezing Cardiovascular system: regular rhythm, s1-s2 Gastrointestinal system: Nondistended, nontender, pos BS Central nervous system: No seizures, no tremors Extremities: No cyanosis, no joint deformities Skin: No rashes, no pallor Psychiatry: Affect normal // no auditory hallucinations   Data Reviewed:  There are no new results to review at this time.  Family Communication: Pt in room, family not at bedside  Disposition: Status is: Inpatient Remains inpatient appropriate because: severity of illness  Planned Discharge Destination:  Unclear at this time    Author: Rickey Barbara, MD 01/17/2023 6:09 PM  For on call review [email or web address].

## 2023-01-17 NOTE — TOC Progression Note (Signed)
Transition of Care [facility]) - Progression Note    Patient Details  Name: Danielle [name] MRN: [id number] Date of Birth: 01-21-1946  Transition of Care [facility]) CM/SW Contact  Ronny Bacon, RN Phone Number: 01/17/2023, 3:30 PM  Clinical Narrative:   Secure chat received with discussions for plan of care for patient. In progression meeting, reported that patient husband is starting hospice at home services for himself. Family is having difficulty caring for the husband and wants the patient to come home to be with him. Patient appears to need assistance of her own and due to insurance issue, cannot seem to get past the authorization for SNF. Patient is participating in [place] program for now. Patient would be unsafe discharge home with just [facility] services at this time as there is no one at home to provide needed care outside of [facility].    Expected Discharge Plan: Skilled Nursing Facility Barriers to Discharge: Continued Medical Work up  Expected Discharge Plan and Services In-house Referral: Clinical Social Work Discharge Planning Services: CM Consult Post Acute Care Choice: Skilled Nursing Facility Living arrangements for the past 2 months: Single Family Home                           HH Arranged: PT, OT HH Agency: [facility] Date [facility] Agency Contacted: 12/27/22 Time HH Agency Contacted: 1423 Representative spoke with at [facility] Agency: Kandee Keen   Social Determinants of Health (SDOH) Interventions SDOH Screenings   Food Insecurity: No Food Insecurity (12/25/2022)  Housing: Low Risk  (12/25/2022)  Transportation Needs: No Transportation Needs (12/25/2022)  Utilities: Not At Risk (12/25/2022)  Alcohol Screen: Low Risk  (02/01/2022)  Depression (PHQ2-9): Low Risk  (06/04/2022)  Recent Concern: Depression (PHQ2-9) - Medium Risk (04/09/2022)  Financial Resource Strain: Low Risk  (02/01/2022)  Recent Concern: Financial Resource Strain - Medium Risk (01/25/2022)  Physical  Activity: Inactive (02/01/2022)  Social Connections: Socially Integrated (02/01/2022)  Stress: No Stress Concern Present (02/01/2022)  Tobacco Use: Medium Risk (12/27/2022)    Readmission Risk Interventions     No data to display

## 2023-01-17 NOTE — Progress Notes (Signed)
Occupational Therapy Treatment Patient Details Name: Danielle [name] MRN: [id number] DOB: April 03, 1945 Today's Date: 01/17/2023   History of present illness Pt is 77 year old presented to [facility] on  12/25/22 for acute respiratory failure with hypoxia and copd exacerbation. VQ scan confirms PE 10/28, age indeterminate B LE DVTs. New diagnosis of COVID-19 on  PMH - anxiety, breast cancer, COPD, CHF, CAD, HTN, and CVA with residual RUE weakness, Lt hip fx with IM nail   OT comments  STAR Patient OT session: Patient unable to recall discussion last week about STAR program but willing to participate. Patient able to get to EOB with increased time and CGA and ambulated to sink for self care. Patient performed grooming and UB/LB bathing standing at sink with sink for support. Patient performed dressing seated with min assist to donn t-shirt and mod assist for pullup brief and pants due to difficulty threading LLE into clothing and assistance pulling up pants. Patient performed transfer training with min to CGA for  sit to stand and frequent cues for posture and staying close to walker. Patient performed toileting at end of session with assistance to complete toilet hygiene while standing. Patient to continue to be followed by acute OT with STAR program to progress to home with family and HHOT to follow.       If plan is discharge home, recommend the following:  A little help with walking and/or transfers;A lot of help with bathing/dressing/bathroom;Assistance with cooking/housework;Assist for transportation;Help with stairs or ramp for entrance;Direct supervision/assist for medications management;Direct supervision/assist for financial management   Equipment Recommendations  Other (comment) (continue to assess)    Recommendations for Other Services      Precautions / Restrictions Precautions Precautions: Fall Precaution Comments: watch SpO2. HR and SOB; COVID positive Restrictions Weight  Bearing Restrictions: No       Mobility Bed Mobility Overal bed mobility: Needs Assistance Bed Mobility: Supine to Sit     Supine to sit: Contact guard, HOB elevated, Used rails     General bed mobility comments: increased time and cues for rail use and body mechanics    Transfers Overall transfer level: Needs assistance Equipment used: Rolling walker (2 wheels) Transfers: Sit to/from Stand, Bed to chair/wheelchair/BSC Sit to Stand: Contact guard assist, Min assist     Step pivot transfers: Contact guard assist     General transfer comment: min assist to stand from EOB and from recliner and BSC patient required CGA to min assist     Balance Overall balance assessment: Needs assistance Sitting-balance support: No upper extremity supported, Feet supported Sitting balance-Leahy Scale: Fair Sitting balance - Comments: EOB   Standing balance support: Single extremity supported, Bilateral upper extremity supported, During functional activity Standing balance-Leahy Scale: Poor Standing balance comment: reliant on external support for balance                           ADL either performed or assessed with clinical judgement   ADL Overall ADL's : Needs assistance/impaired     Grooming: Wash/dry hands;Wash/dry face;Oral care;Contact guard assist;Standing Grooming Details (indicate cue type and reason): at sink using sink for support Upper Body Bathing: Contact guard assist;Standing Upper Body Bathing Details (indicate cue type and reason): using sink for support Lower Body Bathing: Moderate assistance;Sit to/from stand Lower Body Bathing Details (indicate cue type and reason): assistance for bottom while standing to complete Upper Body Dressing : Minimal assistance;Sitting Upper Body Dressing Details (indicate cue type  and reason): to donn t-shirt Lower Body Dressing: Moderate assistance;Sit to/from stand Lower Body Dressing Details (indicate cue type and  reason): required assistance to thread clothing on LLE and to pullup Toilet Transfer: Minimal assistance;Ambulation;Regular Toilet;BSC/3in1;Grab bars;Rolling walker (2 wheels) Toilet Transfer Details (indicate cue type and reason): min assist for RW use in bathroom cues for safe transfer and hand placement Toileting- Clothing Manipulation and Hygiene: Moderate assistance;Sitting/lateral lean;Sit to/from stand Toileting - Clothing Manipulation Details (indicate cue type and reason): patient performed toilet hygiene seated and assistance to complete while standing     Functional mobility during ADLs: Contact guard assist;Rolling walker (2 wheels)      Extremity/Trunk Assessment              Vision       Perception     Praxis      Cognition Arousal: Alert Behavior During Therapy: WFL for tasks assessed/performed Overall Cognitive Status: No family/caregiver present to determine baseline cognitive functioning Area of Impairment: Attention, Memory, Following commands, Safety/judgement, Awareness, Problem solving                   Current Attention Level: Selective Memory: Decreased short-term memory Following Commands: Follows one step commands consistently, Follows one step commands with increased time Safety/Judgement: Decreased awareness of safety, Decreased awareness of deficits Awareness: Emergent Problem Solving: Slow processing, Difficulty sequencing, Decreased initiation, Requires verbal cues, Requires tactile cues General Comments: did not recall discussing STAR program but participated well        Exercises      Shoulder Instructions       General Comments      Pertinent Vitals/ Pain       Pain Assessment Pain Assessment: Faces Faces Pain Scale: Hurts even more Pain Location: L knee Pain Descriptors / Indicators: Discomfort, Grimacing, Guarding Pain Intervention(s): Limited activity within patient's tolerance, Monitored during session,  Repositioned  Home Living                                          Prior Functioning/Environment              Frequency  Min 1X/week        Progress Toward Goals  OT Goals(current goals can now be found in the care plan section)  Progress towards OT goals: Progressing toward goals  Acute Rehab OT Goals Patient Stated Goal: go home OT Goal Formulation: With patient Time For Goal Achievement: 01/25/23 Potential to Achieve Goals: Good ADL Goals Pt Will Perform Lower Body Dressing: with contact guard assist;sitting/lateral leans;sit to/from stand Pt Will Transfer to Toilet: with supervision;ambulating Pt/caregiver will Perform Home Exercise Program: Increased strength;Both right and left upper extremity;With theraband;With Supervision;With written HEP provided Additional ADL Goal #1: Pt will verbalize at least 2 energy conservation strategies to implement with ADLs/mobility with handout provided. Additional ADL Goal #2: Patient will demonstrate ability to appropriately sequence a 3-step functional or therapeutic task with Mod I in 3/4 opportunities.  Plan      Co-evaluation                 AM-PAC OT "6 Clicks" Daily Activity     Outcome Measure   Help from another person eating meals?: A Little Help from another person taking care of personal grooming?: A Little Help from another person toileting, which includes using toliet, bedpan, or urinal?: A Lot Help from  another person bathing (including washing, rinsing, drying)?: A Lot Help from another person to put on and taking off regular upper body clothing?: A Little Help from another person to put on and taking off regular lower body clothing?: A Lot 6 Click Score: 15    End of Session Equipment Utilized During Treatment: Gait belt;Rolling walker (2 wheels)  OT Visit Diagnosis: Unsteadiness on feet (R26.81);Other abnormalities of gait and mobility (R26.89);Muscle weakness (generalized)  (M62.81);Pain Pain - Right/Left: Left Pain - part of body: Knee   Activity Tolerance Patient tolerated treatment well;Patient limited by pain   Patient Left in chair;with call bell/phone within reach;with chair alarm set   Nurse Communication Mobility status        Time: 8295-6213 OT Time Calculation (min): 45 min  Charges: OT General Charges $OT Visit: 1 Visit OT Treatments $Self Care/Home Management : 23-37 mins $Therapeutic Activity: 8-22 mins  Alfonse Flavors, OTA Acute Rehabilitation Services  Office [phone]   Dewain Penning 01/17/2023, 12:00 PM

## 2023-01-18 DIAGNOSIS — J9601 Acute respiratory failure with hypoxia: Secondary | ICD-10-CM | POA: Diagnosis not present

## 2023-01-18 DIAGNOSIS — N179 Acute kidney failure, unspecified: Secondary | ICD-10-CM | POA: Diagnosis not present

## 2023-01-18 NOTE — Plan of Care (Signed)

## 2023-01-18 NOTE — Progress Notes (Signed)
Progress Note   Patient: Danielle [name] VHQ:[id number] DOB: Nov 10, 1945 DOA: 12/25/2022     24 DOS: the patient was seen and examined on 01/18/2023   Brief hospital course: 77 y.o. female past medical history significant for essential hypertension, hyperlipidemia systolic heart failure, paroxysmal atrial fibrillation on Eliquis presented to ED for shortness of breath and hypoxia found to be in sepsis probably due to pneumonia, there was also concern for PE VQ scan was done on 12/27/2022 that confirms a PE   Assessment and Plan: Severe sepsis possibly due to pneumonia: Empirically on antibiotics blood cultures have been negative till date. Completed course of antibiotics in house. Mechanical fall/left knee pain/osteoarthritis: Fell on 12/31/2022 slid down to the ground no significant trauma. X-ray on films so severe degenerative disease will need to follow-up with orthopedic surgery as an outpatient. D/c plan was for pt to continue with STAR program for pt to improve enough to d/c. There had been difficulties establishing discharge, however today, pt states a friend will be arriving from out of state to help  Per PT, recommendations may change to HHPT after arrival of pt's friend TOC continues to follow   Acute respiratory failure with hypoxia likely due to PE/bilateral DVTs: Initially required nonrebreather now has been weaned to room air.   Acute PE/bilateral DVTs: D-dimer greater than 20, VQ scan confirm PE lower extremity Doppler was positive for DVT. Now on Eliquis. Stable   Elevated troponins: Likely due to PE denies any chest pain.   Acute kidney injury chronic kidney see stage IIIb: With a baseline creatinine of around 1.6 on admission 3.1, now back to baseline.   Nausea vomiting and diarrhea: CT scan of the abdomen pelvis showed no acute findings now resolved.   Essential hypertension: Continue Coreg and Aldactone.   Paroxysmal atrial fibrillation: Patient  is currently on Coreg and Eliquis and outpatient.   Chronic systolic heart failure: Continue Coreg and Aldactone 2D echo showed newly reduced EF of 45% will need follow-up with cardiology as an outpatient. Continue losartan.   Hypothyroidism: Continue Synthroid.   Thrombocytopenia, Follow-up PCP as an outpatient.   History of bioprosthetic aortic valve: 2016 noted.   Morbid obesity: Noted   COVID-19 infection: Pt tested pos for COVID 01/09/23 Tmax 100.6, she has defervesced and has no leukocytosis no respiratory symptoms blood cultures remain negative to date. Chest x-ray showed no acute findings. Continue to monitor and treat conservatively. Isolation to end 11/20      Subjective: Extremely eager to go home  Physical Exam: Vitals:   01/18/23 0600 01/18/23 0850 01/18/23 0856 01/18/23 1530  BP: 110/68  (!) 112/57 100/69  Pulse: 77  75 84  Resp: 18   16  Temp: 98.9 F (37.2 C)  97.9 F (36.6 C) 98.7 F (37.1 C)  TempSrc: Oral  Oral Oral  SpO2: 98% 100%  98%  Weight:      Height:       General exam: Awake, laying in bed, in nad Respiratory system: Normal respiratory effort, no wheezing Cardiovascular system: regular rate, s1, s2 Gastrointestinal system: Soft, nondistended, positive BS Central nervous system: CN2-12 grossly intact, strength intact Extremities: Perfused, no clubbing Skin: Normal skin turgor, no notable skin lesions seen Psychiatry: Mood normal // no visual hallucinations   Data Reviewed:  There are no new results to review at this time.  Family Communication: Pt in room, family not at bedside  Disposition: Status is: Inpatient Remains inpatient appropriate because: severity of illness  Planned Discharge  Destination: Home with Home Health    Author: Rickey Barbara, MD 01/18/2023 4:56 PM  For on call review [email or web address].

## 2023-01-18 NOTE — Progress Notes (Signed)
Orthopedic Tech Progress Note Patient Details:  Danielle [name] January 02, 1946 [id number] Called in [employer] per order.  Patient ID: Danielle [name], female   DOB: 09/20/1945, 77 y.o.   MRN: [id number]  Danielle [name] 01/18/2023, 5:41 PM

## 2023-01-18 NOTE — TOC Progression Note (Addendum)
Transition of Care [facility]) - Progression Note    Patient Details  Name: Danielle [name] MRN: [id number] Date of Birth: 1946/02/13  [facility]) CM/SW Contact  Delilah Shan, LCSWA Phone Number: 01/18/2023, 10:25 AM  Clinical Narrative:     CSW spoke with Kia with [facility] who informed CSW that after running patients insurance it was determined that patient is managed by [facility]. Kia started insurance authorization. Insurance authorization currently pending for SNF. CSW updated patient.CSW will continue to follow.  Expected Discharge Plan: Skilled Nursing Facility Barriers to Discharge: Continued Medical Work up  Expected Discharge Plan and Services In-house Referral: Clinical Social Work Discharge Planning Services: CM Consult Post Acute Care Choice: Skilled Nursing Facility Living arrangements for the past 2 months: Single Family Home                           HH Arranged: PT, OT HH Agency: [facility] Date [facility] Agency Contacted: 12/27/22 Time HH Agency Contacted: 1423 Representative spoke with at [facility] Agency: Kandee Keen   Social Determinants of Health (SDOH) Interventions SDOH Screenings   Food Insecurity: No Food Insecurity (12/25/2022)  Housing: Low Risk  (12/25/2022)  Transportation Needs: No Transportation Needs (12/25/2022)  Utilities: Not At Risk (12/25/2022)  Alcohol Screen: Low Risk  (02/01/2022)  Depression (PHQ2-9): Low Risk  (06/04/2022)  Recent Concern: Depression (PHQ2-9) - Medium Risk (04/09/2022)  Financial Resource Strain: Low Risk  (02/01/2022)  Recent Concern: Financial Resource Strain - Medium Risk (01/25/2022)  Physical Activity: Inactive (02/01/2022)  Social Connections: Socially Integrated (02/01/2022)  Stress: No Stress Concern Present (02/01/2022)  Tobacco Use: Medium Risk (12/27/2022)    Readmission Risk Interventions     No data to display

## 2023-01-18 NOTE — Progress Notes (Signed)
Occupational Therapy Treatment Patient Details Name: Danielle [name] MRN: [id number] DOB: April 20, 1945 Today's Date: 01/18/2023   History of present illness Pt is 77 year old presented to [facility] on  12/25/22 for acute respiratory failure with hypoxia and copd exacerbation. VQ scan confirms PE 10/28, age indeterminate B LE DVTs. New diagnosis of COVID-19 on  PMH - anxiety, breast cancer, COPD, CHF, CAD, HTN, and CVA with residual RUE weakness, Lt hip fx with IM nail   OT comments  STAR patient OT session: Patient with increased LLE knee pain on this date with mobility and standing. Patient able to ambulate to sink with min assist to stand and CGA and frequent cues for posture and staying close to RW. Patient performed grooming standing at sink and UB bathing and bathing legs seated with min assist for peri area when standing. Patient continues to require mod assist to donn pants due to assistance threading legs into clothing and assistance pulling up clothing. UE strengthening exercises performed to increase functional strength to assist with sit to stands. Patient to continue to be followed by acute OT with STAR program to address bathing, dressing, and toilet transfers.       If plan is discharge home, recommend the following:  A little help with walking and/or transfers;A lot of help with bathing/dressing/bathroom;Assistance with cooking/housework;Assist for transportation;Help with stairs or ramp for entrance;Direct supervision/assist for medications management;Direct supervision/assist for financial management   Equipment Recommendations  None recommended by OT (continue to address)    Recommendations for Other Services      Precautions / Restrictions Precautions Precautions: Fall Precaution Comments: watch SpO2. HR and SOB; COVID positive Restrictions Weight Bearing Restrictions: No       Mobility Bed Mobility Overal bed mobility: Needs Assistance Bed Mobility: Supine to  Sit     Supine to sit: Contact guard, HOB elevated, Used rails     General bed mobility comments: increased time and verbal cues    Transfers Overall transfer level: Needs assistance Equipment used: Rolling walker (2 wheels) Transfers: Sit to/from Stand, Bed to chair/wheelchair/BSC Sit to Stand: Min assist           General transfer comment: Patient was min assist to stand and CGA to ambulate to sink and to recliner. Patient with increased LLE knee pain on this date with decreased standing tolerance and pain with ambulation     Balance Overall balance assessment: Needs assistance Sitting-balance support: No upper extremity supported, Feet supported Sitting balance-Leahy Scale: Fair Sitting balance - Comments: EOB   Standing balance support: Single extremity supported, Bilateral upper extremity supported, During functional activity Standing balance-Leahy Scale: Poor Standing balance comment: reliant on external support                           ADL either performed or assessed with clinical judgement   ADL Overall ADL's : Needs assistance/impaired     Grooming: Wash/dry hands;Wash/dry face;Oral care;Contact guard assist;Standing Grooming Details (indicate cue type and reason): at sink using sink for support Upper Body Bathing: Supervision/ safety;Sitting Upper Body Bathing Details (indicate cue type and reason): performed seated due to increased LLE knee pain Lower Body Bathing: Sit to/from stand;Minimal assistance Lower Body Bathing Details (indicate cue type and reason): bathed legs seated and stood for peri area bathing with min assist to complete     Lower Body Dressing: Moderate assistance;Sit to/from stand Lower Body Dressing Details (indicate cue type and reason): continues to require cues  to donn LLE first and assistance to thread legs into clothing and to pull up while standing               General ADL Comments: increased LLE knee pain`     Extremity/Trunk Assessment              Vision       Perception     Praxis      Cognition Arousal: Alert Behavior During Therapy: WFL for tasks assessed/performed Overall Cognitive Status: No family/caregiver present to determine baseline cognitive functioning Area of Impairment: Attention, Memory, Following commands, Safety/judgement, Awareness, Problem solving                   Current Attention Level: Selective Memory: Decreased short-term memory Following Commands: Follows one step commands consistently, Follows one step commands with increased time Safety/Judgement: Decreased awareness of safety, Decreased awareness of deficits Awareness: Emergent Problem Solving: Slow processing, Difficulty sequencing, Decreased initiation, Requires verbal cues, Requires tactile cues General Comments: continues to require cues for posture and safe walker use        Exercises Exercises: General Upper Extremity General Exercises - Upper Extremity Shoulder Flexion: Strengthening, Both, 10 reps, Seated, Theraband Theraband Level (Shoulder Flexion): Level 1 (Yellow) Shoulder Horizontal ABduction: Strengthening, Both, 10 reps, Seated, Theraband Theraband Level (Shoulder Horizontal Abduction): Level 1 (Yellow) Elbow Flexion: Strengthening, Both, 10 reps, Seated, Theraband Theraband Level (Elbow Flexion): Level 1 (Yellow) Elbow Extension: Strengthening, Both, 10 reps, Seated, Theraband Theraband Level (Elbow Extension): Level 1 (Yellow)    Shoulder Instructions       General Comments VSS on RA    Pertinent Vitals/ Pain       Pain Assessment Pain Assessment: Faces Faces Pain Scale: Hurts whole lot Pain Location: Left knee with mobility Pain Descriptors / Indicators: Discomfort, Grimacing, Guarding, Moaning Pain Intervention(s): Limited activity within patient's tolerance, Monitored during session, Repositioned, Patient requesting pain meds-RN notified  Home Living                                           Prior Functioning/Environment              Frequency  Min 1X/week        Progress Toward Goals  OT Goals(current goals can now be found in the care plan section)  Progress towards OT goals: Progressing toward goals  Acute Rehab OT Goals Patient Stated Goal: go home OT Goal Formulation: With patient Time For Goal Achievement: 01/25/23 Potential to Achieve Goals: Good ADL Goals Pt Will Perform Lower Body Dressing: with contact guard assist;sitting/lateral leans;sit to/from stand Pt Will Transfer to Toilet: with supervision;ambulating Pt/caregiver will Perform Home Exercise Program: Increased strength;Both right and left upper extremity;With theraband;With Supervision;With written HEP provided Additional ADL Goal #1: Pt will verbalize at least 2 energy conservation strategies to implement with ADLs/mobility with handout provided. Additional ADL Goal #2: Patient will demonstrate ability to appropriately sequence a 3-step functional or therapeutic task with Mod I in 3/4 opportunities.  Plan      Co-evaluation                 AM-PAC OT "6 Clicks" Daily Activity     Outcome Measure   Help from another person eating meals?: A Little Help from another person taking care of personal grooming?: A Little Help from another person toileting, which includes using  toliet, bedpan, or urinal?: A Lot Help from another person bathing (including washing, rinsing, drying)?: A Lot Help from another person to put on and taking off regular upper body clothing?: A Little Help from another person to put on and taking off regular lower body clothing?: A Lot 6 Click Score: 15    End of Session Equipment Utilized During Treatment: Gait belt;Rolling walker (2 wheels)  OT Visit Diagnosis: Unsteadiness on feet (R26.81);Other abnormalities of gait and mobility (R26.89);Muscle weakness (generalized) (M62.81);Pain Pain - Right/Left:  Left Pain - part of body: Knee   Activity Tolerance Patient limited by pain   Patient Left in chair;with call bell/phone within reach;with chair alarm set   Nurse Communication Mobility status;Patient requests pain meds        Time: 1610-9604 OT Time Calculation (min): 44 min  Charges: OT General Charges $OT Visit: 1 Visit OT Treatments $Self Care/Home Management : 23-37 mins $Therapeutic Exercise: 8-22 mins  Alfonse Flavors, OTA Acute Rehabilitation Services  Office [phone]   Dewain Penning 01/18/2023, 11:22 AM

## 2023-01-18 NOTE — Progress Notes (Signed)
Physical Therapy Treatment Patient Details Name: Danielle [name] MRN: [id number] DOB: [birth year] Today's Date: 01/18/2023   History of Present Illness Pt is 77 year old presented to [facility] on  12/25/22 for acute respiratory failure with hypoxia and copd exacerbation. VQ scan confirms PE 10/28, age indeterminate B LE DVTs. New diagnosis of COVID-19 on  PMH - anxiety, breast cancer, COPD, CHF, CAD, HTN, and CVA with residual RUE weakness, Lt hip fx with IM nail    PT Comments  STAR PT Session: Inquired of pt, whether it was true that a family friend was coming to help with taking care of pt so that she could discharge home instead of going to SNF. Pt confirms and reports it is okay to contact friend. Focus of session, continued stair training to be able to safely get into her home. Pt premedicated prior to session but is still limited by L knee pain with stair training and is unable to fully ascend one step. MD agreed to order for L knee brace to improve stability and decrease pain with stepping up one step to enter home. Will trial during session tomorrow. After session, family friend contacted and reports she is a retired Lawyer and confirms that she will be coming into town in the next 2 days to assist pt and her husband. Will continue to work towards safe mobility in home environment.    If plan is discharge home, recommend the following: A little help with walking and/or transfers;A little help with bathing/dressing/bathroom;Assistance with cooking/housework;Assist for transportation   Can travel by private vehicle     No  Equipment Recommendations  None recommended by PT       Precautions / Restrictions Precautions Precautions: Fall Precaution Comments: watch SpO2. HR and SOB; COVID positive Restrictions Weight Bearing Restrictions: No     Mobility  Bed Mobility               General bed mobility comments: up in recliner on entry, agreeable to try to step up on  portable step    Transfers Overall transfer level: Needs assistance Equipment used: Rolling walker (2 wheels) Transfers: Sit to/from Stand, Bed to chair/wheelchair/BSC Sit to Stand: Min assist, +2 physical assistance, Contact guard assist, Mod assist           General transfer comment: initially contact guard to power up from recliner with maximal cuing for sequencing, with seated rest breaks during stair training pt requiring up to modAx2 for powering up to RW  requiring modA    Ambulation/Gait Ambulation/Gait assistance: +2 safety/equipment, Min assist Gait Distance (Feet): 4 Feet Assistive device: Rolling walker (2 wheels) Gait Pattern/deviations: Step-to pattern, Decreased step length - right, Decreased step length - left, Trunk flexed, Antalgic, Knee flexed in stance - left, Decreased weight shift to left, Decreased stance time - left Gait velocity: decr Gait velocity interpretation: <1.31 ft/sec, indicative of household ambulator   General Gait Details: contact guard assist to walk from recliner to step braced against the wall, cuing for upright posture and proximity to [employer] Stairs: Yes Stairs assistance: Mod assist, +2 physical assistance Stair Management: Forwards Number of Stairs: 1 General stair comments: pt unable to fully ascend 1 step today due to increased L knee pain, able to perform 3 bouts of step taps with L foot and 5x with R foot, L knee pain too great to achieve full step up         Balance Overall balance assessment: Needs assistance Sitting-balance support:  No upper extremity supported, Feet supported Sitting balance-Leahy Scale: Fair Sitting balance - Comments: EOB   Standing balance support: Single extremity supported, Bilateral upper extremity supported, During functional activity Standing balance-Leahy Scale: Poor Standing balance comment: reliant on RW for support                            Cognition Arousal:  Alert Behavior During Therapy: WFL for tasks assessed/performed Overall Cognitive Status: No family/caregiver present to determine baseline cognitive functioning Area of Impairment: Attention, Memory, Following commands, Safety/judgement, Awareness, Problem solving                   Current Attention Level: Selective Memory: Decreased short-term memory Following Commands: Follows one step commands consistently, Follows one step commands with increased time Safety/Judgement: Decreased awareness of safety, Decreased awareness of deficits Awareness: Emergent Problem Solving: Slow processing, Difficulty sequencing, Decreased initiation, Requires verbal cues, Requires tactile cues General Comments: constant cuing for upright posture and proximity to RW, pt report it is easier to step up to step with proper posture but she is unable to maintain        Exercises Other Exercises Other Exercises: step up with l foot x 10, 2 bouts Other Exercises: step up with R foot x5    General Comments General comments (skin integrity, edema, etc.): requested Dr Rhona Leavens to order L knee brace to help with stabilizing L knee in hopes of decreased pain with step up      Pertinent Vitals/Pain Pain Assessment Pain Assessment: Faces Faces Pain Scale: Hurts whole lot Pain Location: L knee especially with attempts for stair climbing Pain Descriptors / Indicators: Discomfort, Grimacing, Guarding Pain Intervention(s): Limited activity within patient's tolerance, Monitored during session, Premedicated before session, Repositioned     PT Goals (current goals can now be found in the care plan section) Acute Rehab PT Goals PT Goal Formulation: With patient Time For Goal Achievement: 01/21/23 Potential to Achieve Goals: Fair Progress towards PT goals: Progressing toward goals    Frequency    Min 1X/week       AM-PAC PT "6 Clicks" Mobility   Outcome Measure  Help needed turning from your back to  your side while in a flat bed without using bedrails?: A Little Help needed moving from lying on your back to sitting on the side of a flat bed without using bedrails?: A Little Help needed moving to and from a bed to a chair (including a wheelchair)?: A Lot Help needed standing up from a chair using your arms (e.g., wheelchair or bedside chair)?: A Little Help needed to walk in hospital room?: Total Help needed climbing 3-5 steps with a railing? : A Lot 6 Click Score: 14    End of Session Equipment Utilized During Treatment: Gait belt Activity Tolerance: Patient limited by fatigue Patient left: with call bell/phone within reach;in chair;with chair alarm set Nurse Communication: Mobility status PT Visit Diagnosis: Other abnormalities of gait and mobility (R26.89);Muscle weakness (generalized) (M62.81);History of falling (Z91.81)     Time: 8295-6213 PT Time Calculation (min) (ACUTE ONLY): 29 min  Charges:    $Gait Training: 8-22 mins $Therapeutic Activity: 8-22 mins PT General Charges $$ ACUTE PT VISIT: 1 Visit                     Jem Castro B. Beverely Risen PT, DPT Acute Rehabilitation Services Please use secure chat or  Call Office [phone]  Elon Alas Fleet 01/18/2023, 4:18 PM

## 2023-01-19 ENCOUNTER — Inpatient Hospital Stay: Payer: Medicare HMO | Attending: Hematology

## 2023-01-19 LAB — CBC
HCT: 38 % (ref 36.0–46.0)
Hemoglobin: 12.4 g/dL (ref 12.0–15.0)
MCH: 30.5 pg (ref 26.0–34.0)
MCHC: 32.6 g/dL (ref 30.0–36.0)
MCV: 93.4 fL (ref 80.0–100.0)
Platelets: 87 10*3/uL — ABNORMAL LOW (ref 150–400)
RBC: 4.07 MIL/uL (ref 3.87–5.11)
RDW: 13.4 % (ref 11.5–15.5)
WBC: 5.6 10*3/uL (ref 4.0–10.5)
nRBC: 0 % (ref 0.0–0.2)

## 2023-01-19 LAB — COMPREHENSIVE METABOLIC PANEL
ALT: 18 U/L (ref 0–44)
AST: 20 U/L (ref 15–41)
Albumin: 3 g/dL — ABNORMAL LOW (ref 3.5–5.0)
Alkaline Phosphatase: 77 U/L (ref 38–126)
Anion gap: 8 (ref 5–15)
BUN: 15 mg/dL (ref 8–23)
CO2: 22 mmol/L (ref 22–32)
Calcium: 9.9 mg/dL (ref 8.9–10.3)
Chloride: 106 mmol/L (ref 98–111)
Creatinine, Ser: 1.3 mg/dL — ABNORMAL HIGH (ref 0.44–1.00)
GFR, Estimated: 42 mL/min — ABNORMAL LOW (ref >60)
Glucose, Bld: 106 mg/dL — ABNORMAL HIGH (ref 70–99)
Potassium: 4.4 mmol/L (ref 3.5–5.1)
Sodium: 136 mmol/L (ref 135–145)
Total Bilirubin: 0.6 mg/dL (ref <1.2)
Total Protein: 6.4 g/dL — ABNORMAL LOW (ref 6.5–8.1)

## 2023-01-19 MED ORDER — ACETAMINOPHEN 325 MG PO TABS: 650.0000 mg | ORAL_TABLET | Freq: Four times a day (QID) | ORAL | Status: DC | PRN

## 2023-01-19 MED ORDER — LOSARTAN POTASSIUM 25 MG PO TABS: 25.0000 mg | ORAL_TABLET | Freq: Every day | ORAL | Status: DC

## 2023-01-19 MED ORDER — POLYETHYLENE GLYCOL 3350 17 G PO PACK: 17.0000 g | PACK | Freq: Every day | ORAL | Status: DC | PRN

## 2023-01-19 NOTE — Progress Notes (Signed)
Report called to [facility] and [employer].

## 2023-01-19 NOTE — Discharge Summary (Signed)
DISCHARGE SUMMARY  Danielle [name]  MR#: [id number]  DOB:[birth year]  Date of Admission: 12/25/2022 Date of Discharge: 01/19/2023  Attending Physician:Tajanay Hurley Silvestre Gunner, MD  Patient's ION:[id number], Timoteo Expose, MD  Disposition: D/C to SNF for rehab stay   Follow-up Appts:  Contact information for follow-up providers     [place], [name], MD Follow up.   Specialty: Family Medicine Why: Schedule a visit 7 days after you are released from your rehab facility. Contact information: [address] [place] [address] [phone]         Laurey Morale, MD Follow up in 3 week(s).   Specialty: Cardiology Contact information: [address] [place] [address] [phone]              Contact information for after-discharge care     Destination     [facility] Preferred SNF .   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  Initial presentation: 77 year old with a history of HTN, HLD, chronic systolic CHF, and PAF on Eliquis who presented to the ED with shortness of breath and was found to be suffering with pneumonia.   During her hospital stay a VQ scan 12/27/2022 was positive for PE. 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   Hospital Course:  Sepsis due to pneumonia Has completed a course of antibiotic therapy - blood cultures  negative - clinically resolved - sats 90+% on RA   Acute hypoxic respiratory failure due to pneumonia and PE Has been weaned to room air with treatment of inciting events - resolved at time of d/c    Acute PE with bilateral lower extremity DVTs PE confirmed on VQ scan - bilateral lower extremity venous duplex positive for DVT - stable on Eliquis presently - likely incited by CoViD as well as other acute medical issues leading to decreased activity - would complete minimum of 6 months of anticoag for this issue, but pt requires anticoag for afib so likely will be lifelong    Incidentally noted COVID infection Tested + 01/09/2023 - was afebrile with no attributable respiratory symptoms - CXR with no findings suggestive of a viral pneumonitis - does not require further isolation outside of the hospital    AKI on CKD stage IIIb Baseline creatinine 1.6 - creatinine 3.1 at presentation -renal function has returned to baseline with supportive care  Recent Labs  Lab 01/13/23 0551 01/14/23 0605 01/17/23 0737 01/17/23 1125 01/19/23 0958  CREATININE 1.50* 1.23* 1.70* 1.50* 1.30*      HTN Well-controlled with current medication   PAF on chronic Eliquis Rate controlled - cont anticoag - clotting during this admission suggests pt not compliant w/ meds as outpt - pt counseled on need for strict medication compliance    Chronic systolic CHF EF 45% on TTE - follow-up with cardiology as outpatient - continue Coreg, losartan, and Aldactone   Hypothyroidism Continue usual Synthroid dose  Thrombocytopenia Likely due to acute clotting -monitor and outpatient follow-up   History of bioprosthetic aortic valve No evidence of valve dysfunction at present   Morbid obesity - Body mass index is 37.19 kg/m.  Allergies as of 01/19/2023       Reactions   Tetanus Toxoid Adsorbed Swelling   Arm swelling   Zestril [lisinopril] Cough   Peanut (diagnostic) Cough        Medication List      STOP taking these medications    MECLIZINE HCL PO       TAKE these medications    acetaminophen 325 MG tablet Commonly known as: TYLENOL Take 2 tablets (650 mg total) by mouth every 6 (six) hours as needed for mild pain (pain score 1-3), fever or headache.   apixaban 5 MG Tabs tablet Commonly known as: ELIQUIS Take 1 tablet (5 mg total) by mouth 2 (two) times daily.   carvedilol 25 MG tablet Commonly known as: COREG Take 1 tablet (25 mg total) by mouth 2 (two) times daily with a meal.   losartan 25 MG tablet Commonly known as: COZAAR Take 1 tablet (25 mg total) by mouth daily. Start taking on: January 20, 2023   omeprazole 40 MG capsule Commonly known as: PRILOSEC Take 1 capsule (40 mg total) by mouth daily. What changed:  when to take this reasons to take this   polyethylene glycol 17 g packet Commonly known as: MIRALAX / GLYCOLAX Take 17 g by mouth daily as needed for moderate constipation.   Promacta 25 MG tablet Generic drug: eltrombopag Take 1 tablet (25 mg total) by mouth daily. Take on an empty stomach, 1 hour before a meal or 2 hours after.   spironolactone 25 MG tablet Commonly known as: ALDACTONE Take 1 tablet (25 mg total) by mouth daily.   trolamine salicylate 10 % cream Commonly known as: ASPERCREME Apply 1 application topically as needed for muscle pain.        Day of Discharge BP 110/82 (BP Location: Right Arm)   Pulse 68   Temp 98 F (36.7 C) (Oral)   Resp 16   Ht 5\' 7"  (1.702 m)   Wt 107.7 kg   SpO2 95%   BMI 37.19 kg/m   Physical Exam: General: No acute respiratory distress Lungs: Clear to auscultation bilaterally without wheezes or crackles Cardiovascular: Regular rate and rhythm without murmur gallop or rub normal S1 and S2 Abdomen: Nontender, nondistended, soft, bowel sounds positive, no rebound, no ascites, no appreciable mass Extremities: No significant cyanosis, clubbing, or edema bilateral lower extremities  Basic  Metabolic Panel: Recent Labs  Lab 01/13/23 0551 01/14/23 0605 01/17/23 0737 01/17/23 1125 01/19/23 0958  NA 130* 137 133* 135 136  K 4.2 4.5 4.8 4.1 4.4  CL 103 107 108 106 106  CO2 21* 23 18* 21* 22  GLUCOSE 90 90 93 101* 106*  BUN 15 15 16 15 15   CREATININE 1.50* 1.23* 1.70* 1.50* 1.30*  CALCIUM 9.7 9.3 9.2 9.9 9.9    CBC: Recent Labs  Lab 01/13/23 1031 01/17/23 0737 01/19/23 0958  WBC 3.3* 5.3 5.6  HGB 11.9* 12.5 12.4  HCT 36.5 39.7 38.0  MCV 94.8 99.7 93.4  PLT 79* 81* 87*    Time spent in discharge (includes decision making & examination of pt): 35 minutes  01/19/2023, 12:21 PM   Lonia Blood, MD [facility] Office  [phone]

## 2023-01-19 NOTE — Progress Notes (Signed)
Occupational Therapy Treatment Patient Details Name: Danielle [name] MRN: [id number] DOB: Nov 22, 1945 Today's Date: 01/19/2023   History of present illness Pt is 77 year old presented to [facility] on  12/25/22 for acute respiratory failure with hypoxia and copd exacerbation. VQ scan confirms PE 10/28, age indeterminate B LE DVTs. New diagnosis of COVID-19 on  PMH - anxiety, breast cancer, COPD, CHF, CAD, HTN, and CVA with residual RUE weakness, Lt hip fx with IM nail   OT comments  STAR patient OT session: Patient continues to perform bed mobility with CGA and fair sitting balance. Patient able to ambulate to bathroom with min assist and complaints of left knee pain. Patient performed LB bathing seated on toilet and donned underwear with mod assist. Patient able to ambulate to sink and performed grooming and UB bathing/dressing seated due to pain. Patient required max assist to donn new Bledsoe brace while seated on EOB before transfer to recliner. Patient will benefit from continued inpatient follow up therapy, <3 hours/day due to increased assistance needed for self care and transfers. Acute OT to continue to follow with STAR program.       If plan is discharge home, recommend the following:  A little help with walking and/or transfers;A lot of help with bathing/dressing/bathroom;Assistance with cooking/housework;Assist for transportation;Help with stairs or ramp for entrance;Direct supervision/assist for medications management;Direct supervision/assist for financial management   Equipment Recommendations  None recommended by OT (patient has needed DME)    Recommendations for Other Services      Precautions / Restrictions Precautions Precautions: Fall Precaution Comments: watch SpO2. HR and SOB; COVID positive Required Braces or Orthoses: Other Brace (bledsoe brace for LLE) Restrictions Weight Bearing Restrictions: No       Mobility Bed Mobility Overal bed mobility: Needs  Assistance Bed Mobility: Supine to Sit     Supine to sit: Contact guard, HOB elevated, Used rails     General bed mobility comments: increased time and verbal cues to perform    Transfers Overall transfer level: Needs assistance Equipment used: Rolling walker (2 wheels) Transfers: Sit to/from Stand, Bed to chair/wheelchair/BSC Sit to Stand: Min assist     Step pivot transfers: Min assist     General transfer comment: increased assistance due to pain     Balance Overall balance assessment: Needs assistance Sitting-balance support: No upper extremity supported, Feet supported Sitting balance-Leahy Scale: Fair Sitting balance - Comments: EOB   Standing balance support: Single extremity supported, Bilateral upper extremity supported, During functional activity Standing balance-Leahy Scale: Poor Standing balance comment: reliant on UE support for balance                           ADL either performed or assessed with clinical judgement   ADL Overall ADL's : Needs assistance/impaired     Grooming: Wash/dry hands;Wash/dry face;Oral care;Set up;Sitting Grooming Details (indicate cue type and reason): performed seated due to L knee pain Upper Body Bathing: Supervision/ safety;Sitting Upper Body Bathing Details (indicate cue type and reason): performed seated due to increased LLE knee pain Lower Body Bathing: Sit to/from stand;Minimal assistance Lower Body Bathing Details (indicate cue type and reason): bathed legs seated and stood for peri area bathing with min assist to complete Upper Body Dressing : Minimal assistance;Sitting Upper Body Dressing Details (indicate cue type and reason): to donn t-shirt Lower Body Dressing: Moderate assistance;Sit to/from stand;Maximal assistance Lower Body Dressing Details (indicate cue type and reason): to donn underwear and max assist  for socks Toilet Transfer: Minimal assistance;Ambulation;Regular Toilet;BSC/3in1;Grab bars;Rolling  walker (2 wheels) Toilet Transfer Details (indicate cue type and reason): 3n1 over commode with cues for safe walker use, reaching back and posture Toileting- Clothing Manipulation and Hygiene: Moderate assistance;Sitting/lateral lean;Sit to/from stand Toileting - Clothing Manipulation Details (indicate cue type and reason): Patient performed toilet hygiene seated and required assistance to complete while standing       General ADL Comments: increased LLE knee pain`    Extremity/Trunk Assessment              Vision       Perception     Praxis      Cognition Arousal: Alert Behavior During Therapy: WFL for tasks assessed/performed Overall Cognitive Status: No family/caregiver present to determine baseline cognitive functioning Area of Impairment: Attention, Memory, Following commands, Safety/judgement, Awareness, Problem solving                   Current Attention Level: Selective Memory: Decreased short-term memory Following Commands: Follows one step commands consistently, Follows one step commands with increased time Safety/Judgement: Decreased awareness of safety, Decreased awareness of deficits Awareness: Emergent Problem Solving: Slow processing, Difficulty sequencing, Decreased initiation, Requires verbal cues, Requires tactile cues General Comments: cues for posture and safe walker use        Exercises      Shoulder Instructions       General Comments new bledsoe brace donned with max assist    Pertinent Vitals/ Pain       Pain Assessment Pain Assessment: Faces Faces Pain Scale: Hurts whole lot Pain Location: L knee with mobility Pain Descriptors / Indicators: Discomfort, Grimacing, Guarding Pain Intervention(s): Limited activity within patient's tolerance, Monitored during session, Repositioned  Home Living                                          Prior Functioning/Environment              Frequency  Min 1X/week         Progress Toward Goals  OT Goals(current goals can now be found in the care plan section)  Progress towards OT goals: Progressing toward goals  Acute Rehab OT Goals Patient Stated Goal: go home OT Goal Formulation: With patient Time For Goal Achievement: 01/25/23 Potential to Achieve Goals: Good ADL Goals Pt Will Perform Lower Body Dressing: with contact guard assist;sitting/lateral leans;sit to/from stand Pt Will Transfer to Toilet: with supervision;ambulating Pt/caregiver will Perform Home Exercise Program: Increased strength;Both right and left upper extremity;With theraband;With Supervision;With written HEP provided Additional ADL Goal #1: Pt will verbalize at least 2 energy conservation strategies to implement with ADLs/mobility with handout provided. Additional ADL Goal #2: Patient will demonstrate ability to appropriately sequence a 3-step functional or therapeutic task with Mod I in 3/4 opportunities.  Plan      Co-evaluation                 AM-PAC OT "6 Clicks" Daily Activity     Outcome Measure   Help from another person eating meals?: A Little Help from another person taking care of personal grooming?: A Little Help from another person toileting, which includes using toliet, bedpan, or urinal?: A Lot Help from another person bathing (including washing, rinsing, drying)?: A Lot Help from another person to put on and taking off regular upper body clothing?: A Little Help from  another person to put on and taking off regular lower body clothing?: A Lot 6 Click Score: 15    End of Session Equipment Utilized During Treatment: Gait belt;Rolling walker (2 wheels)  OT Visit Diagnosis: Unsteadiness on feet (R26.81);Other abnormalities of gait and mobility (R26.89);Muscle weakness (generalized) (M62.81);Pain Pain - Right/Left: Left Pain - part of body: Knee   Activity Tolerance Patient limited by pain   Patient Left in chair;with call bell/phone within  reach;with chair alarm set   Nurse Communication Mobility status        Time: 8657-8469 OT Time Calculation (min): 44 min  Charges: OT General Charges $OT Visit: 1 Visit OT Treatments $Self Care/Home Management : 38-52 mins  Alfonse Flavors, OTA Acute Rehabilitation Services  Office [phone]   Dewain Penning 01/19/2023, 1:21 PM

## 2023-01-19 NOTE — TOC Progression Note (Addendum)
Transition of Care [facility]) - Progression Note    Patient Details  Name: Danielle [name] MRN: [id number] Date of Birth: 1945/03/09  [facility]) CM/SW Contact  Lawerance Sabal, RN Phone Number: 01/19/2023, 11:04 AM  Clinical Narrative:     Insurance auth still pending for SNF. Patient continues to participate in STAR program.   I spoke w patient to establish a back up plan of home with home health.  Patient states that she has RW, cane, electric adjustable bed, and bedside commode at home. She is currently RA, and does not appear to have any additional DME needs for DC.   A referral was made previously to [facility] for home health services, and liaison has been notified that patient may DC to home, [facility] orders with [facility] will need placed.   Patient states that she will have family available to provide transportation and asked that I reach out to her spouse [name]. I called and his number was not answered and I could not leave a voicemail. I asked the patient to reach out to him.  There are no other contacts listed.  I Spoke w PT who provided me with Trinna Post, a CNA friend's number. I spoke w Trinna Post who states that she cannot get to [facility] until Friday November 29th.  [name]'s number is [phone]  UPDATE Per CSW Berkley Harvey has been received for SNF. I called [name] back and let her know to keep in touch w the patient to make plans to coordinate her DC from SNF and coming in to town to assist at home.   Expected Discharge Plan: Skilled Nursing Facility Barriers to Discharge: Continued Medical Work up  Expected Discharge Plan and Services In-house Referral: Clinical Social Work Discharge Planning Services: CM Consult Post Acute Care Choice: Skilled Nursing Facility Living arrangements for the past 2 months: Single Family Home                           HH Arranged: PT, OT HH Agency: [facility] Date [facility] Agency Contacted: 12/27/22 Time HH Agency Contacted:  1423 Representative spoke with at [facility] Agency: Kandee Keen   Social Determinants of Health (SDOH) Interventions SDOH Screenings   Food Insecurity: No Food Insecurity (12/25/2022)  Housing: Low Risk  (12/25/2022)  Transportation Needs: No Transportation Needs (12/25/2022)  Utilities: Not At Risk (12/25/2022)  Alcohol Screen: Low Risk  (02/01/2022)  Depression (PHQ2-9): Low Risk  (06/04/2022)  Recent Concern: Depression (PHQ2-9) - Medium Risk (04/09/2022)  Financial Resource Strain: Low Risk  (02/01/2022)  Recent Concern: Financial Resource Strain - Medium Risk (01/25/2022)  Physical Activity: Inactive (02/01/2022)  Social Connections: Socially Integrated (02/01/2022)  Stress: No Stress Concern Present (02/01/2022)  Tobacco Use: Medium Risk (12/27/2022)    Readmission Risk Interventions     No data to display

## 2023-01-19 NOTE — Progress Notes (Signed)
Physical Therapy Treatment Patient Details Name: Danielle [name] MRN: [id number] DOB: 1945-04-06 Today's Date: 01/19/2023   History of Present Illness Pt is 77 year old presented to [facility] on  12/25/22 for acute respiratory failure with hypoxia and copd exacerbation. VQ scan confirms PE 10/28, age indeterminate B LE DVTs. New diagnosis of COVID-19 on  PMH - anxiety, breast cancer, COPD, CHF, CAD, HTN, and CVA with residual RUE weakness, Lt hip fx with IM nail    PT Comments  Pt sitting up in recliner, with hip abduction brace placed instead of ordered bledsoe hinged knee brace . Ortho Tech called and confirmed incorrect brace had been ordered, Ortho Tech contacted [employer] and knee brace would not be available until after pt discharge to SNF today. PT had knee brace order cancelled and returned Hip Ab brace to Ortho Tech. Pt continues to be limited in safe mobility by L knee pain with mobilization but is able to get up with min Ax2 and ambulate to bathroom with minA. Pt requires assist for standing pericare but is able to pull up her underware. PTAR present at end of session for discharge to [facility].      If plan is discharge home, recommend the following: A little help with walking and/or transfers;A little help with bathing/dressing/bathroom;Assistance with cooking/housework;Assist for transportation   Can travel by private vehicle     No  Equipment Recommendations  None recommended by PT       Precautions / Restrictions Precautions Precautions: Fall Precaution Comments: HR and SOB; COVID positive Required Braces or Orthoses: Other Brace (bledsoe brace for LLE) Restrictions Weight Bearing Restrictions: No     Mobility  Bed Mobility               General bed mobility comments: up in recliner on entry, agreeable to try to step up on portable step    Transfers Overall transfer level: Needs assistance Equipment used: Rolling walker (2 wheels) Transfers: Sit to/from  Stand, Bed to chair/wheelchair/BSC Sit to Stand: Min assist, +2 physical assistance, Contact guard assist, Mod assist           General transfer comment: pt with increased use of momentum to come to standing, requires minAx2 to bring CoG over BoS, increased cues for upright postur    Ambulation/Gait Ambulation/Gait assistance: +2 safety/equipment, Min assist Gait Distance (Feet): 12 Feet (+4) Assistive device: Rolling walker (2 wheels) Gait Pattern/deviations: Step-to pattern, Decreased step length - right, Decreased step length - left, Trunk flexed, Antalgic, Knee flexed in stance - left, Decreased weight shift to left, Decreased stance time - left Gait velocity: decr Gait velocity interpretation: <1.31 ft/sec, indicative of household ambulator   General Gait Details: constant cuing for upright posture and proximity to RW, antalgic gait due to knee pain      Balance Overall balance assessment: Needs assistance Sitting-balance support: No upper extremity supported, Feet supported Sitting balance-Leahy Scale: Fair Sitting balance - Comments: EOB   Standing balance support: Single extremity supported, Bilateral upper extremity supported, During functional activity Standing balance-Leahy Scale: Poor Standing balance comment: reliant on RW for support                            Cognition Arousal: Alert Behavior During Therapy: WFL for tasks assessed/performed Overall Cognitive Status: No family/caregiver present to determine baseline cognitive functioning Area of Impairment: Attention, Memory, Following commands, Safety/judgement, Awareness, Problem solving  Current Attention Level: Selective Memory: Decreased short-term memory Following Commands: Follows one step commands consistently, Follows one step commands with increased time Safety/Judgement: Decreased awareness of safety, Decreased awareness of deficits Awareness: Emergent Problem  Solving: Slow processing, Difficulty sequencing, Decreased initiation, Requires verbal cues, Requires tactile cues General Comments: constant cuing for RW use           General Comments General comments (skin integrity, edema, etc.): hip abduction brace had been incorrectly delivered, Ortho Tech contacted, and unable to get hinged knee brace delivered before discharge.      Pertinent Vitals/Pain Pain Assessment Pain Assessment: Faces Faces Pain Scale: Hurts even more Pain Location: L knee with weightbearing Pain Descriptors / Indicators: Discomfort, Grimacing, Guarding Pain Intervention(s): Limited activity within patient's tolerance, Monitored during session, Repositioned     PT Goals (current goals can now be found in the care plan section) Acute Rehab PT Goals PT Goal Formulation: With patient Time For Goal Achievement: 01/21/23 Potential to Achieve Goals: Fair Progress towards PT goals: Progressing toward goals    Frequency    Min 1X/week       AM-PAC PT "6 Clicks" Mobility   Outcome Measure  Help needed turning from your back to your side while in a flat bed without using bedrails?: A Little Help needed moving from lying on your back to sitting on the side of a flat bed without using bedrails?: A Little Help needed moving to and from a bed to a chair (including a wheelchair)?: A Lot Help needed standing up from a chair using your arms (e.g., wheelchair or bedside chair)?: A Little Help needed to walk in hospital room?: Total Help needed climbing 3-5 steps with a railing? : A Lot 6 Click Score: 14    End of Session Equipment Utilized During Treatment: Gait belt Activity Tolerance: Patient limited by fatigue Patient left: with call bell/phone within reach;in chair;with chair alarm set Nurse Communication: Mobility status PT Visit Diagnosis: Other abnormalities of gait and mobility (R26.89);Muscle weakness (generalized) (M62.81);History of falling (Z91.81)      Time: 1335-1405 PT Time Calculation (min) (ACUTE ONLY): 30 min  Charges:    $Gait Training: 8-22 mins $Therapeutic Activity: 8-22 mins PT General Charges $$ ACUTE PT VISIT: 1 Visit                     Adonai Helzer B. Beverely Risen PT, DPT Acute Rehabilitation Services Please use secure chat or  Call Office [phone]    Elon Alas Fleet 01/19/2023, 3:24 PM

## 2023-01-19 NOTE — TOC Transition Note (Addendum)
Transition of Care [facility]) - CM/SW Discharge Note   Patient Details  Name: Danielle [name] MRN: [id number] Date of Birth: 19-Jul-1945  Transition of Care [facility]) CM/SW Contact:  Delilah Shan, LCSWA Phone Number: 01/19/2023, 12:49 PM   Clinical Narrative:     Alvino Chapel with [facility] informed CSW that insurance authorization has been approved for SNF. CSW informed patient and MD.  Patient will DC to: [facility]   Anticipated DC date: 01/19/2023   Family notified: Scientist, water quality by: Sharin Mons  ?  Per MD patient ready for DC to [facility] . RN, patient, patient's family, and facility notified of DC. Discharge Summary sent to facility. RN given number for report tele#[phone] RM#[phone]. DC packet on chart. DNR signed by MD attached to patients DC packet. Ambulance transport requested for patient.  CSW signing off.   Final next level of care: Skilled Nursing Facility Barriers to Discharge: No Barriers Identified   Patient Goals and CMS Choice CMS Medicare.gov Compare Post Acute Care list provided to:: Patient Choice offered to / list presented to : Patient  Discharge Placement                Patient chooses bed at: [facility] Patient to be transferred to facility by: PTAR Name of family member notified: [name] Patient and family notified of of transfer: 01/19/23  Discharge Plan and Services Additional resources added to the After Visit Summary for   In-house Referral: Clinical Social Work Discharge Planning Services: CM Consult Post Acute Care Choice: Skilled Nursing Facility                    HH Arranged: PT, OT [facility] Agency: [facility] Date [facility] Agency Contacted: 12/27/22 Time HH Agency Contacted: 1423 Representative spoke with at [facility] Agency: Kandee Keen  Social Determinants of Health (SDOH) Interventions SDOH Screenings   Food Insecurity: No Food Insecurity (12/25/2022)  Housing: Low Risk  (12/25/2022)  Transportation Needs: No Transportation Needs (12/25/2022)   Utilities: Not At Risk (12/25/2022)  Alcohol Screen: Low Risk  (02/01/2022)  Depression (PHQ2-9): Low Risk  (06/04/2022)  Recent Concern: Depression (PHQ2-9) - Medium Risk (04/09/2022)  Financial Resource Strain: Low Risk  (02/01/2022)  Recent Concern: Financial Resource Strain - Medium Risk (01/25/2022)  Physical Activity: Inactive (02/01/2022)  Social Connections: Socially Integrated (02/01/2022)  Stress: No Stress Concern Present (02/01/2022)  Tobacco Use: Medium Risk (12/27/2022)     Readmission Risk Interventions     No data to display

## 2023-01-21 ENCOUNTER — Other Ambulatory Visit: Payer: Self-pay

## 2023-01-26 ENCOUNTER — Other Ambulatory Visit: Payer: Self-pay

## 2023-02-04 ENCOUNTER — Ambulatory Visit: Payer: Medicare HMO

## 2023-02-04 VITALS — Ht 67.0 in | Wt 247.0 lb

## 2023-02-04 DIAGNOSIS — Z0000 Encounter for general adult medical examination without abnormal findings: Secondary | ICD-10-CM

## 2023-02-04 NOTE — Patient Instructions (Addendum)
Danielle [name] , Thank you for taking time to come for your Medicare Wellness Visit. I appreciate your ongoing commitment to your health goals. Please review the following plan we discussed and let me know if I can assist you in the future.   Referrals/Orders/Follow-Ups/Clinician Recommendations:    This is a list of the screening recommended for you and due dates:  Health Maintenance  Topic Date Due   COVID-19 Vaccine (4 - 2023-24 season) 10/31/2022   Medicare Annual Wellness Visit  02/04/2024   Pneumonia Vaccine  Completed   Flu Shot  Completed   DEXA scan (bone density measurement)  Completed   Hepatitis C Screening  Completed   Zoster (Shingles) Vaccine  Completed   HPV Vaccine  Aged Out   DTaP/Tdap/Td vaccine  Discontinued    Advanced directives: (Declined) Advance directive discussed with you today. Even though you declined this today, please call our office should you change your mind, and we can give you the proper paperwork for you to fill out.  Next Medicare Annual Wellness Visit scheduled for next year: Yes

## 2023-02-04 NOTE — Progress Notes (Addendum)
Subjective:   Danielle [name] is a 77 y.o. female who presents for Medicare Annual (Subsequent) preventive examination.  Visit Complete: Virtual I connected with  Daronda [name] on 02/04/23 by a audio enabled telemedicine application and verified that I am speaking with the correct person using two identifiers.  Patient Location: Home  Provider Location: Home Office  I discussed the limitations of evaluation and management by telemedicine. The patient expressed understanding and agreed to proceed.  Vital Signs: Because this visit was a virtual/telehealth visit, some criteria may be missing or patient reported. Any vitals not documented were not able to be obtained and vitals that have been documented are patient reported.    Cardiac Risk Factors include: advanced age (>81men, >10 women);hypertension     Objective:    Today's Vitals   02/04/23 1415 02/04/23 1416  Weight: 247 lb (112 kg)   Height: 5\' 7"  (1.702 m)   PainSc:  0-No pain   Body mass index is 38.69 kg/m.     12/25/2022    3:00 PM 12/25/2022    9:45 AM 06/23/2022   11:07 AM 02/01/2022   12:48 PM 12/28/2021    3:46 PM 01/19/2021   10:37 AM 12/08/2020   10:29 AM  Advanced Directives  Does Patient Have a Medical Advance Directive? No No Yes No No Yes Yes  Type of Surveyor, minerals;Living will   Healthcare Power of [place];Living will Healthcare Power of [place];Living will  Does patient want to make changes to medical advance directive?   No - Patient declined      Copy of Healthcare Power of Attorney in Chart?      No - copy requested No - copy requested  Would patient like information on creating a medical advance directive? No - Patient declined Yes (ED - Information included in AVS)  No - Patient declined No - Patient declined      Current Medications (verified) Outpatient Encounter Medications as of 02/04/2023  Medication Sig   acetaminophen  (TYLENOL) 325 MG tablet Take 2 tablets (650 mg total) by mouth every 6 (six) hours as needed for mild pain (pain score 1-3), fever or headache.   apixaban (ELIQUIS) 5 MG TABS tablet Take 1 tablet (5 mg total) by mouth 2 (two) times daily.   carvedilol (COREG) 25 MG tablet Take 1 tablet (25 mg total) by mouth 2 (two) times daily with a meal.   eltrombopag (PROMACTA) 25 MG tablet Take 1 tablet (25 mg total) by mouth daily. Take on an empty stomach, 1 hour before a meal or 2 hours after.   losartan (COZAAR) 25 MG tablet Take 1 tablet (25 mg total) by mouth daily.   omeprazole (PRILOSEC) 40 MG capsule Take 1 capsule (40 mg total) by mouth daily. (Patient taking differently: Take 40 mg by mouth daily as needed (heartburn).)   polyethylene glycol (MIRALAX / GLYCOLAX) 17 g packet Take 17 g by mouth daily as needed for moderate constipation.   spironolactone (ALDACTONE) 25 MG tablet Take 1 tablet (25 mg total) by mouth daily.   trolamine salicylate (ASPERCREME) 10 % cream Apply 1 application topically as needed for muscle pain.   No facility-administered encounter medications on file as of 02/04/2023.    Allergies (verified) Tetanus toxoid adsorbed, Zestril [lisinopril], and Peanut (diagnostic)   History: Past Medical History:  Diagnosis Date   Anticoagulated on Coumadin    managed by cardiology   Anxiety    Chronic constipation  Chronic systolic CHF (congestive heart failure) (HCC) 2016   cardiologist--- dr end and followed by CHF clinic   CKD (chronic kidney disease), stage III (HCC)    COPD (chronic obstructive pulmonary disease) (HCC)    previous had seen pulmonology --- dr Sherene Sires, note in epic 03-14-2016, dx COPD GOLD 0   Coronary artery disease    cardiologist--- dr Bethena Roys;  last cardiac cath 01-10-20202 mild nonobstructive disease proxLAD;  per pt, had LHC prior to AVR in [place] 2016   DOE (dyspnea on exertion)    GERD (gastroesophageal reflux disease)    History of 2019 novel coronavirus  disease (COVID-19) 12/19/2019   hospital admission w/ covid pneumonia , no intubatiion,  per pt symptoms resolved and back to baseline   History of cerebrovascular accident (CVA) with residual deficit    CVA in 2000 without residuals;  CVA post op AVR 01/ 2016 with residual right hand weakness   History of gastritis    History of pneumothorax    s/p right vats 11-19-2014 and  s/p left vats  12-13-2015   (both spontenaous due to bleb)   Hyperlipidemia    Hypertension    Hyperthyroidism    per pt followed by pcp,  dx approx 2014   Malignant neoplasm of upper-outer quadrant of left breast in female, 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   oncologist-- dr Mosetta Putt--- dx 10/ 2018  ,  s/p left lumptectomy with node dissection;  completed radiation 04-15-2017, no chemo   Multiple thyroid nodules    in care everywhere pt had left thyroid nodule biospy 09-18-2014 benign   Nausea, vomiting, and diarrhea 12/25/2022   NICM (nonischemic cardiomyopathy) (HCC) 2016   2016  ef 30%;  last echo in epic ef 45%   OA (osteoarthritis)    Osteoporosis    Paroxysmal atrial fibrillation [facility])    cardiologist--- dr end   Personal history of radiation therapy    left breast cancer  03-08-2017  to 04-05-2017   Thrombocytopenia [facility])    followed by dr Mosetta Putt   Valvular stenosis, aortic 03/12/2014   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   Wears dentures    full upper   Past Surgical History:  Procedure Laterality Date   AORTIC VALVE REPLACEMENT  03/12/2014   [facility] [place];  St Jude 23mm, medial Trifecta Bioprosthesis   BREAST LUMPECTOMY WITH RADIOACTIVE SEED AND SENTINEL LYMPH NODE BIOPSY Left 01/14/2017   Procedure: LEFT BREAST LUMPECTOMY WITH RADIOACTIVE SEED AND SENTINEL LYMPH NODE BIOPSY;  Surgeon: [name], MD;  Location: [facility] OR;  Service: General;  Laterality: Left;   CARDIAC CATHETERIZATION  02/12/2014   [facility] in [place]   HYSTEROSCOPY WITH D & C N/A  11/17/2020   Procedure: DILATATION AND CURETTAGE /HYSTEROSCOPY WITH MYOSURE;  Surgeon: [name], MD;  Location: [facility];  Service: Gynecology;  Laterality: N/A;   INTRAMEDULLARY (IM) NAIL INTERTROCHANTERIC Left 12/10/2015   Procedure: INTRAMEDULLARY (IM) NAIL INTERTROCHANTRIC;  Surgeon: Tarry Kos, MD;  Location: [facility] OR;  Service: Orthopedics;  Laterality: Left;   OPERATIVE ULTRASOUND N/A 11/17/2020   Procedure: OPERATIVE ULTRASOUND;  Surgeon: [name], MD;  Location: [facility];  Service: Gynecology;  Laterality: N/A;   PLEURADESIS Left 12/03/2015   Procedure: PLEURADESIS;  Surgeon: Loreli Slot, MD;  Location: [facility] OR;  Service: Thoracic;  Laterality: Left;   RESECTION OF APICAL BLEB Left 12/03/2015  Procedure: BLEBECTOMY;  Surgeon: Loreli Slot, MD;  Location: [facility] OR;  Service: Thoracic;  Laterality: Left;   RIGHT/LEFT HEART CATH AND CORONARY ANGIOGRAPHY N/A 03/10/2020   Procedure: RIGHT/LEFT HEART CATH AND CORONARY ANGIOGRAPHY;  Surgeon: [name], MD;  Location: [facility] INVASIVE CV LAB;  Service: Cardiovascular;  Laterality: N/A;   VIDEO ASSISTED THORACOSCOPY Left 12/03/2015   Procedure: VIDEO ASSISTED THORACOSCOPY;  Surgeon: Loreli Slot, MD;  Location[facility] OR;  Service: Thoracic;  Laterality: Left;   VIDEO ASSISTED THORACOSCOPY (VATS) W/TALC PLEUADESIS Right 11/19/2014   in [place]   Family History  Problem Relation Age of Onset   Diabetes Mother    Heart attack Mother 75   Diabetes Father    Lung cancer Father    Diabetes Sister    Thyroid disease Sister    Diabetes Sister    HIV Brother    Colon polyps Neg Hx    Colon cancer Neg Hx    Social History   Socioeconomic History   Marital status: Married    Spouse name: Sherwood   Number of children: 0   Years of education: 12   Highest education level: Bachelor's degree (e.g., BA, AB, BS)  Occupational History   Occupation:  Retired in 2004  Tobacco Use   Smoking status: Former    Current packs/day: 0.00    Average packs/day: 0.3 packs/day for 10.0 years (2.5 ttl pk-yrs)    Types: Cigarettes    Start date: 2002    Quit date: 2012    Years since quitting: 12.9   Smokeless tobacco: Never  Vaping Use   Vaping status: Never Used  Substance and Sexual Activity   Alcohol use: No   Drug use: Never   Sexual activity: Yes    Birth control/protection: Post-menopausal  Other Topics Concern   Not on file  Social History Narrative   Lives with husband.  Ambulated independently.   Right-handed.   No daily caffeine use.   Social Determinants of Health   Financial Resource Strain: Low Risk  (02/04/2023)   Overall Financial Resource Strain (CARDIA)    Difficulty of Paying Living Expenses: Not hard at all  Food Insecurity: No Food Insecurity (02/04/2023)   Hunger Vital Sign    Worried About Running Out of Food in the Last Year: Never true    Ran Out of Food in the Last Year: Never true  Transportation Needs: No Transportation Needs (02/04/2023)   PRAPARE - Administrator, Civil Service (Medical): No    Lack of Transportation (Non-Medical): No  Physical Activity: Insufficiently Active (02/04/2023)   Exercise Vital Sign    Days of Exercise per Week: 2 days    Minutes of Exercise per Session: 20 min  Stress: No Stress Concern Present (02/04/2023)   [name]    Feeling of Stress : Not at all  Social Connections: Socially Integrated (02/04/2023)   Social Connection and Isolation Panel [NHANES]    Frequency of Communication with Friends and Family: More than three times a week    Frequency of Social Gatherings with Friends and Family: More than three times a week    Attends Religious Services: More than 4 times per year    Active Member of [name]: Yes    Attends Engineer, structural: More than 4 times per year     Marital Status: Married    Tobacco Counseling Counseling given: Not Answered  Clinical Intake:  Pre-visit preparation completed: Yes  Pain : No/denies pain Pain Score: 0-No pain Faces Pain Scale: No hurt  Faces Pain Scale: No hurt  BMI - recorded: 38.69 Nutritional Status: BMI > 30  Obese Nutritional Risks: None Diabetes: No  How often do you need to have someone help you when you read instructions, pamphlets, or other written materials from your doctor or pharmacy?: 1 - Never  Interpreter Needed?: No  Information entered by :: Theresa Mulligan LPN   Activities of Daily Living    02/04/2023    2:23 PM 12/25/2022    3:00 PM  In your present state of health, do you have any difficulty performing the following activities:  Hearing? 0 0  Vision? 0 0  Difficulty concentrating or making decisions? 0 1  Comment  difficulty remembering  Walking or climbing stairs? 1   Comment Uses a walker   Dressing or bathing? 0   Doing errands, shopping? 0 0  Preparing Food and eating ? N   Using the Toilet? N   In the past six months, have you accidently leaked urine? N   Do you have problems with loss of bowel control? N   Managing your Medications? N   Managing your Finances? N   Housekeeping or managing your Housekeeping? N     Patient Care Team: [place], [name], MD as PCP - General (Family Medicine) End, Cristal Deer, MD as PCP - Cardiology (Cardiology) Malachy Mood, MD as Consulting Physician (Hematology) Dorothy Puffer, MD as Consulting Physician (Radiation Oncology) [name], MD as Consulting Physician (General Surgery) Axel Filler, Larna Daughters, NP as Nurse Practitioner (Hematology and Oncology) Verner Chol, [facility] (Inactive) as Pharmacist (Pharmacist) Pollyann Samples, NP as Nurse Practitioner (Nurse Practitioner)  Indicate any recent Medical Services you may have received from other than [facility] providers in the past year (date may be approximate).     Assessment:    This is a routine wellness examination for Jenilyn.  Hearing/Vision screen Hearing Screening - Comments:: Denies hearing difficulties   Vision Screening - Comments::  - Not up to date with routine eye exams. Patient declined referral.    Goals Addressed               This Visit's Progress     Patient Stated (pt-stated)        Lose weight!        Depression Screen    02/04/2023    2:22 PM 06/04/2022    9:19 AM 04/09/2022   10:30 AM 02/01/2022   12:39 PM 12/17/2021   10:36 AM 10/27/2021    9:35 AM 07/28/2021    9:52 AM  PHQ 2/9 Scores  PHQ - 2 Score 0 0 4 0 0 0 0  PHQ- 9 Score   10        Fall Risk    02/04/2023    2:27 PM 06/04/2022    9:19 AM 04/09/2022   10:29 AM 02/01/2022   12:47 PM 01/29/2022    9:13 AM  Fall Risk   Falls in the past year? 0 0 1 0 0  Number falls in past yr: 0 0 0 0   Injury with Fall? 0 0 1 0   Risk for fall due to : No Fall Risks  History of fall(s) No Fall Risks   Follow up Falls prevention discussed  Falls evaluation completed Falls prevention discussed     MEDICARE RISK AT HOME: Medicare Risk at Home Any stairs  in or around the home?: No If so, are there any without handrails?: No Home free of loose throw rugs in walkways, pet beds, electrical cords, etc?: Yes Adequate lighting in your home to reduce risk of falls?: Yes Life alert?: No Use of a cane, walker or w/c?: Yes Grab bars in the bathroom?: Yes Shower chair or bench in shower?: Yes Elevated toilet seat or a handicapped toilet?: No  TIMED UP AND GO:  Was the test performed?  No    Cognitive Function:    06/21/2017    3:39 PM 05/20/2016    4:23 PM  MMSE - Mini Mental State Exam  Not completed: -- --        02/04/2023    2:28 PM 02/01/2022   12:49 PM 01/19/2021   10:40 AM 12/05/2019    1:42 PM  6CIT Screen  What Year? 4 points 0 points 0 points 0 points  What month? 0 points 0 points 0 points 0 points  What time? 0 points 0 points 0 points   Count back from 20 0  points 4 points 4 points 0 points  Months in reverse 4 points 4 points 4 points 4 points  Repeat phrase 6 points 0 points 2 points 2 points  Total Score 14 points 8 points 10 points     Immunizations Immunization History  Administered Date(s) Administered   Fluad Quad(high Dose 65+) 11/20/2018, 12/08/2020   Fluad Trivalent(High Dose 65+) 12/28/2022   Influenza, High Dose Seasonal PF 11/24/2016, 12/08/2017   Influenza-Unspecified 12/07/2015, 11/09/2019   Moderna SARS-COV2 Booster Vaccination 02/28/2020   Moderna Sars-Covid-2 Vaccination 03/24/2019, 04/28/2019, 07/02/2020   Pneumococcal Conjugate-13 11/13/2013, 11/21/2014, 12/15/2018   Pneumococcal Polysaccharide-23 12/15/2012, 05/20/2016   Zoster Recombinant(Shingrix) 12/21/2017, 12/15/2018   Zoster, Live 03/01/2014      Flu Vaccine status: Up to date  Pneumococcal vaccine status: Up to date  Covid-19 vaccine status: Declined, Education has been provided regarding the importance of this vaccine but patient still declined. Advised may receive this vaccine at local pharmacy or Health Dept.or vaccine clinic. Aware to provide a copy of the vaccination record if obtained from local pharmacy or Health Dept. Verbalized acceptance and understanding.  Qualifies for Shingles Vaccine? Yes   Zostavax completed Yes   Shingrix Completed?: Yes  Screening Tests Health Maintenance  Topic Date Due   COVID-19 Vaccine (4 - 2023-24 season) 10/31/2022   Medicare Annual Wellness (AWV)  02/04/2024   Pneumonia Vaccine 11+ Years old  Completed   INFLUENZA VACCINE  Completed   DEXA SCAN  Completed   Hepatitis C Screening  Completed   Zoster Vaccines- Shingrix  Completed   HPV VACCINES  Aged Out   DTaP/Tdap/Td  Discontinued    Health Maintenance  Health Maintenance Due  Topic Date Due   COVID-19 Vaccine (4 - 2023-24 season) 10/31/2022        Bone Density status: Completed 01/14/21. Results reflect: Bone density results: OSTEOPENIA.  Repeat every   years.     Additional Screening:  Hepatitis C Screening: does qualify; Completed 01/04/18  Vision Screening: Recommended annual ophthalmology exams for early detection of glaucoma and other disorders of the eye. Is the patient up to date with their annual eye exam?  No  Who is the provider or what is the name of the office in which the patient attends annual eye exams? Patient declined referral If pt is not established with a provider, would they like to be referred to a provider to establish care? No .  Dental Screening: Recommended annual dental exams for proper oral hygiene   Community Resource Referral / Chronic Care Management:  CRR required this visit?  No   CCM required this visit?  No     Plan:     I have personally reviewed and noted the following in the patient's chart:   Medical and social history Use of alcohol, tobacco or illicit drugs  Current medications and supplements including opioid prescriptions. Patient is not currently taking opioid prescriptions. Functional ability and status Nutritional status Physical activity Advanced directives List of other physicians Hospitalizations, surgeries, and ER visits in previous 12 months Vitals Screenings to include cognitive, depression, and falls Referrals and appointments  In addition, I have reviewed and discussed with patient certain preventive protocols, quality metrics, and best practice recommendations. A written personalized care plan for preventive services as well as general preventive health recommendations were provided to patient.     Tillie Rung, LPN   16/03/958   After Visit Summary: (MyChart) Due to this being a telephonic visit, the after visit summary with patients personalized plan was offered to patient via MyChart   Nurse Notes: None

## 2023-02-05 ENCOUNTER — Other Ambulatory Visit (HOSPITAL_COMMUNITY): Payer: Self-pay

## 2023-02-07 ENCOUNTER — Telehealth: Payer: Self-pay | Admitting: Family Medicine

## 2023-02-07 NOTE — Telephone Encounter (Signed)
Pt was scheduled for a HFU on Monday, 02/14/23.

## 2023-02-07 NOTE — Telephone Encounter (Signed)
Needs a hospital follow up visit, home health/insurance requires an office visit with PCP before it can be approved. Can be done virtually as long as they can do video.

## 2023-02-07 NOTE — Telephone Encounter (Signed)
Pt was just discharged from the hospital. Family member called to request referral for a home health aide. Please call back to discuss.

## 2023-02-07 NOTE — Telephone Encounter (Signed)
Pt mother in law Koleen Nimrod is calling and pt would like to know nif she should take losartan (COZAAR) 25 MG tablet

## 2023-02-09 MED ORDER — LOSARTAN POTASSIUM 25 MG PO TABS: 25.0000 mg | ORAL_TABLET | Freq: Every day | ORAL | 2 refills | Status: DC

## 2023-02-09 NOTE — Telephone Encounter (Signed)
According to discharge summery she needed to continue same medications, including Losartan 25 mg daily. Thanks, BJ

## 2023-02-09 NOTE — Addendum Note (Signed)
Addended by: Kathreen Devoid on: 02/09/2023 09:32 AM   Modules accepted: Orders

## 2023-02-09 NOTE — Telephone Encounter (Signed)
I called and spoke with patient. She is aware to continue Losartan 25 mg daily and needed a refill. Rx sent into [facility] on [address] & pt has a hospital f/u scheduled for 12/16.

## 2023-02-14 ENCOUNTER — Encounter: Payer: Medicare HMO | Admitting: Family Medicine

## 2023-02-16 ENCOUNTER — Other Ambulatory Visit: Payer: Medicare HMO

## 2023-02-16 ENCOUNTER — Other Ambulatory Visit: Payer: Self-pay

## 2023-02-16 ENCOUNTER — Ambulatory Visit: Payer: Medicare HMO | Admitting: Hematology

## 2023-02-18 ENCOUNTER — Inpatient Hospital Stay: Payer: Medicare HMO | Admitting: Nurse Practitioner

## 2023-02-18 ENCOUNTER — Telehealth: Payer: Self-pay

## 2023-02-18 ENCOUNTER — Other Ambulatory Visit: Payer: Self-pay | Admitting: Physical Medicine & Rehabilitation

## 2023-02-18 ENCOUNTER — Telehealth: Payer: Self-pay | Admitting: Hematology

## 2023-02-18 ENCOUNTER — Other Ambulatory Visit: Payer: Self-pay

## 2023-02-18 ENCOUNTER — Inpatient Hospital Stay: Payer: Medicare HMO

## 2023-02-18 DIAGNOSIS — G894 Chronic pain syndrome: Secondary | ICD-10-CM

## 2023-02-18 DIAGNOSIS — M159 Polyosteoarthritis, unspecified: Secondary | ICD-10-CM

## 2023-02-18 NOTE — Telephone Encounter (Signed)
Called pt today regarding her no show appt.  She said she forgot and call was transferred to scheduling.

## 2023-02-24 ENCOUNTER — Other Ambulatory Visit: Payer: Self-pay

## 2023-02-28 ENCOUNTER — Telehealth: Payer: Self-pay

## 2023-03-08 ENCOUNTER — Telehealth: Payer: Self-pay | Admitting: Family Medicine

## 2023-03-08 ENCOUNTER — Telehealth: Payer: Self-pay

## 2023-03-08 NOTE — Telephone Encounter (Signed)
 Copied from CRM [id number]. Topic: Referral - Status >> Mar 08, 2023 10:32 AM Rolin D wrote: Reason for CRM: Mirica from [facility] has stated that patient is requesting a child psychotherapist in order to get an Aide to help with bathing and household duties . Talitha stated that the referral request will only be temporary (once/twice a week for a month) . Was advised that referral would be fax to office for approval and asked if you could call back with an approval as soon as possible . Call Back number for Talitha [phone] (its a secured line and you can leave a voice message )

## 2023-03-08 NOTE — Telephone Encounter (Signed)
 I left Mirica a message letting her know pt would need an appt, she hasn't seen PCP since hospital visit.

## 2023-03-08 NOTE — Telephone Encounter (Signed)
 I called and left Ty a message letting her know that pt would need an appointment.

## 2023-03-08 NOTE — Telephone Encounter (Signed)
Duplicate, see other phone note.

## 2023-03-13 NOTE — Progress Notes (Deleted)
 Patient Care Team: [name], [name], MD as PCP - General (Family Medicine) End, [name], MD as PCP - Cardiology (Cardiology) [name], MD as Consulting Physician (Hematology) [name], MD as Consulting Physician (Radiation Oncology) [name], MD as Consulting Physician (General Surgery) [name], Danielle Pickle, NP as Nurse Practitioner (Hematology and Oncology) [name], [facility] (Inactive) as Pharmacist (Pharmacist) [name], NP as Nurse Practitioner (Nurse Practitioner)   CHIEF COMPLAINT:   Oncology History Overview Note  Cancer Staging Malignant neoplasm of upper-outer quadrant of left breast in female, estrogen receptor positive [facility]) Staging form: Breast, AJCC 8th Edition - Clinical stage from 12/13/2016: Stage IB (cT2, cN0, cM0, G2, ER: Positive, PR: Positive, HER2: Negative) - Signed by [name], MD on 12/21/2016 - Pathologic stage from 01/14/2017: Stage IA (pT2, pN0, cM0, G1, ER: Positive, PR: Positive, HER2: Negative, Oncotype DX score: 4) - Signed by [name], MD on 04/17/2017     Malignant neoplasm of upper-outer quadrant of left breast in female, estrogen receptor positive (HCC)  12/06/2016 Mammogram   Diagnostic Mammogram 12/06/16 IMPRESSION:  Suspicious mass in the left breast at 3 o'clock 2 cm from the nipple measuring 1.9 x 1.1 x 2.2 cm. RECOMMENDATION: Ultrasound-guided core biopsy of the mass in the 3 o'clock region of the left breast is recommended. The biopsy will be scheduled at the patient's convenience.   12/13/2016 Initial Biopsy   Diagnosis 12/13/16 Breast, left, needle core biopsy, 3:00 o'clock, 2cmfn - INVASIVE DUCTAL CARCINOMA - SEE COMMENT   12/16/2016 Initial Diagnosis   Malignant neoplasm of upper-outer quadrant of left breast in female, estrogen receptor positive (HCC)   12/17/2016 Receptors her2   Estrogen Receptor: 100%, POSITIVE, STRONG STAINING INTENSITY Progesterone Receptor: 100%, POSITIVE, STRONG  STAINING INTENSITY Proliferation Marker Ki67: 30% HER2 - NEGATIVE    01/14/2017 Surgery   Left breast lumpectomy with Dr. Ebbie   01/14/2017 Pathology Results   Diagnosis 01/14/17 1. Breast, lumpectomy, Left - INVASIVE DUCTAL CARCINOMA, GRADE I/III, SPANNING 2.1 CM. - DUCTAL CARCINOMA IN SITU, LOW GRADE. - INVASIVE CARCINOMA IS BROADLY PRESENT AT THE INFERIOR MARGIN OF SPECIMEN 1. - DUCTAL CARCINOMA IN SITU IS FOCALLY PRESENT AT THE INFERIOR MARGIN OF SPECIMEN 1 AND BROADLY LESS THAN 0.1 CM TO THE LATERAL MARGIN OF SPECIMEN 1. - SEE ONCOLOGY TABLE BELOW. 2. Breast, excision, Additional medial margin left - DUCTAL CARCINOMA IN SITU, LOW GRADE. - DUCTAL CARCINOMA IS FOCALLY LESS THAN 0.1 CM TO THE NEW MARGIN OF SPECIMEN 2. 3. Breast, excision, Additional lateral margin left - DUCTAL CARCINOMA IN SITU, LOW GRADE. - DUCTAL CARCINOMA IN SITU IS GREATER THAN 0.2 CM TO ALL MARGINS. 4. Breast, excision, Additional superior margin left - DUCTAL CARCINOMA IN SITU, LOW GRADE. - DUCTAL CARCINOMA IN SITU IS BROADLY LESS THAN 0.1 CM TO THE NEW MARGIN OF SPECIMEN 4. 5. Lymph node, sentinel, biopsy, Left axillary - THERE IS NO EVIDENCE OF CARCINOMA IN 1 OF 1 LYMPH NODE (0/1). 6. Breast, excision, Additional inferior margin left - DUCTAL CARCINOMA IN SITU, LOW GRADE. - DUCTAL CARCINOMA IN SITU IS GREATER THAN 0.2 CM TO ALL MARGINS.    01/14/2017 Oncotype testing   Her oncotype recurrence score is 4 and her distance recurrent on Tamoxifen  alone is 3%.   03/08/2017 - 04/05/2017 Radiation Therapy   RT with Dr. Dewey    06/2017 -  Anti-estrogen oral therapy   Tamoxifen  daily   12/15/2017 Mammogram   12/15/2017 Mammogram IMPRESSION: New lumpectomy site left breast. No mammographic  evidence of malignancy in either breast.      CURRENT THERAPY:   INTERVAL HISTORY   ROS   Past Medical History:  Diagnosis Date   Anticoagulated on Coumadin     managed by cardiology   Anxiety     Chronic constipation    Chronic systolic CHF (congestive heart failure) (HCC) 2016   cardiologist--- dr end and followed by CHF clinic   CKD (chronic kidney disease), stage III (HCC)    COPD (chronic obstructive pulmonary disease) (HCC)    previous had seen pulmonology --- dr darlean, note in epic 03-14-2016, dx COPD GOLD 0   Coronary artery disease    cardiologist--- dr dayle;  last cardiac cath 01-10-20202 mild nonobstructive disease proxLAD;  per pt, had LHC prior to AVR in [place] 2016   DOE (dyspnea on exertion)    GERD (gastroesophageal reflux disease)    History of 2019 novel coronavirus disease (COVID-19) 12/19/2019   hospital admission w/ covid pneumonia , no intubatiion,  per pt symptoms resolved and back to baseline   History of cerebrovascular accident (CVA) with residual deficit    CVA in 2000 without residuals;  CVA post op AVR 01/ 2016 with residual right hand weakness   History of gastritis    History of pneumothorax    s/p right vats 11-19-2014 and  s/p left vats  12-13-2015   (both spontenaous due to bleb)   Hyperlipidemia    Hypertension    Hyperthyroidism    per pt followed by pcp,  dx approx 2014   Malignant neoplasm of upper-outer quadrant of left breast in female, 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   oncologist-- dr lanny--- dx 10/ 2018  ,  s/p left lumptectomy with node dissection;  completed radiation 04-15-2017, no chemo   Multiple thyroid  nodules    in care everywhere pt had left thyroid  nodule biospy 09-18-2014 benign   Nausea, vomiting, and diarrhea 12/25/2022   NICM (nonischemic cardiomyopathy) (HCC) 2016   2016  ef 30%;  last echo in epic ef 45%   OA (osteoarthritis)    Osteoporosis    Paroxysmal atrial fibrillation [facility])    cardiologist--- dr end   Personal history of radiation therapy    left breast cancer  03-08-2017  to 04-05-2017   Thrombocytopenia [facility]    followed by dr lanny   Valvular stenosis, aortic 03/12/2014   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[place]  for severe AS   Wears dentures    full upper     Past Surgical History:  Procedure Laterality Date   AORTIC VALVE REPLACEMENT  03/12/2014   [facility] in [place];  St Jude 23mm, medial Trifecta Bioprosthesis   BREAST LUMPECTOMY WITH RADIOACTIVE SEED AND SENTINEL LYMPH NODE BIOPSY Left 01/14/2017   Procedure: LEFT BREAST LUMPECTOMY WITH RADIOACTIVE SEED AND SENTINEL LYMPH NODE BIOPSY;  Surgeon: [name], MD;  Location[facility] OR;  Service: General;  Laterality: Left;   CARDIAC CATHETERIZATION  02/12/2014   [facility] in [place]   HYSTEROSCOPY WITH D & C N/A 11/17/2020   Procedure: DILATATION AND CURETTAGE /HYSTEROSCOPY WITH MYOSURE;  Surgeon: Cathlyn JAYSON Nikki Bobie FORBES, MD;  Location: [facility];  Service: Gynecology;  Laterality: N/A;   INTRAMEDULLARY (IM) NAIL INTERTROCHANTERIC Left 12/10/2015   Procedure: INTRAMEDULLARY (IM) NAIL INTERTROCHANTRIC;  Surgeon: Kay CHRISTELLA Cummins, MD;  Location: [facility] OR;  Service: Orthopedics;  Laterality: Left;   OPERATIVE ULTRASOUND N/A 11/17/2020   Procedure: OPERATIVE ULTRASOUND;  Surgeon: Cathlyn  JAYSON Nikki Bobie FORBES, MD;  Location[facility]  Service: Gynecology;  Laterality: N/A;   PLEURADESIS Left 12/03/2015   Procedure: PLEURADESIS;  Surgeon: Elspeth JAYSON Millers, MD;  Location[facility] OR;  Service: Thoracic;  Laterality: Left;   RESECTION OF APICAL BLEB Left 12/03/2015   Procedure: BLEBECTOMY;  Surgeon: Elspeth JAYSON Millers, MD;  Location: [facility] OR;  Service: Thoracic;  Laterality: Left;   RIGHT/LEFT HEART CATH AND CORONARY ANGIOGRAPHY N/A 03/10/2020   Procedure: RIGHT/LEFT HEART CATH AND CORONARY ANGIOGRAPHY;  Surgeon: Mady Bruckner, MD;  Location: [facility] INVASIVE CV LAB;  Service: Cardiovascular;  Laterality: N/A;   VIDEO ASSISTED THORACOSCOPY Left 12/03/2015   Procedure: VIDEO ASSISTED THORACOSCOPY;  Surgeon: Elspeth JAYSON Millers, MD;  Location: [facility] OR;  Service: Thoracic;  Laterality: Left;   VIDEO  ASSISTED THORACOSCOPY (VATS) W/TALC  PLEUADESIS Right 11/19/2014   in [place]     Outpatient Encounter Medications as of 03/15/2023  Medication Sig Note   acetaminophen  (TYLENOL ) 325 MG tablet Take 2 tablets (650 mg total) by mouth every 6 (six) hours as needed for mild pain (pain score 1-3), fever or headache.    apixaban  (ELIQUIS ) 5 MG TABS tablet Take 1 tablet (5 mg total) by mouth 2 (two) times daily. 12/25/2022: Pt states she takes this medication when she takes her carvedilol . Pt said she does not take her medications as she should. Per dispense report, LF 06/07/2022 #180, 90 DS.      carvedilol  (COREG ) 25 MG tablet Take 1 tablet (25 mg total) by mouth 2 (two) times daily with a meal.    eltrombopag  (PROMACTA ) 25 MG tablet Take 1 tablet (25 mg total) by mouth daily. Take on an empty stomach, 1 hour before a meal or 2 hours after.    losartan  (COZAAR ) 25 MG tablet Take 1 tablet (25 mg total) by mouth daily.    omeprazole  (PRILOSEC) 40 MG capsule Take 1 capsule (40 mg total) by mouth daily. (Patient taking differently: Take 40 mg by mouth daily as needed (heartburn).)    polyethylene glycol (MIRALAX  / GLYCOLAX ) 17 g packet Take 17 g by mouth daily as needed for moderate constipation.    spironolactone  (ALDACTONE ) 25 MG tablet Take 1 tablet (25 mg total) by mouth daily.    trolamine salicylate (ASPERCREME) 10 % cream Apply 1 application topically as needed for muscle pain.    No facility-administered encounter medications on file as of 03/15/2023.     There were no vitals filed for this visit. There is no height or weight on file to calculate BMI.   PHYSICAL EXAM GENERAL:alert, no distress and comfortable SKIN: no rash  EYES: sclera clear NECK: without mass LYMPH:  no palpable cervical or supraclavicular lymphadenopathy  LUNGS: clear with normal breathing effort HEART: regular rate & rhythm, no lower extremity edema ABDOMEN: abdomen soft, non-tender and normal bowel sounds NEURO:  alert & oriented x 3 with fluent speech, no focal motor/sensory deficits Breast exam:  PAC without erythema    CBC    Component Value Date/Time   WBC 5.6 01/19/2023 0958   RBC 4.07 01/19/2023 0958   HGB 12.4 01/19/2023 0958   HGB 13.8 11/18/2022 0953   HGB 12.4 02/27/2020 0924   HGB 13.2 12/22/2016 0850   HCT 38.0 01/19/2023 0958   HCT 38.2 02/27/2020 0924   HCT 40.0 12/22/2016 0850   PLT 87 (L) 01/19/2023 0958   PLT 50 (L) 11/18/2022 0953   PLT 111 (L) 02/27/2020 0924   MCV  93.4 01/19/2023 0958   MCV 94 02/27/2020 0924   MCV 96.2 12/22/2016 0850   MCH 30.5 01/19/2023 0958   MCHC 32.6 01/19/2023 0958   RDW 13.4 01/19/2023 0958   RDW 14.1 02/27/2020 0924   RDW 13.3 12/22/2016 0850   LYMPHSABS 0.7 01/09/2023 0426   LYMPHSABS 1.1 12/22/2016 0850   MONOABS 0.7 01/09/2023 0426   MONOABS 0.4 12/22/2016 0850   EOSABS 0.1 01/09/2023 0426   EOSABS 0.2 12/22/2016 0850   EOSABS 0.2 04/08/2016 1056   BASOSABS 0.0 01/09/2023 0426   BASOSABS 0.0 12/22/2016 0850     CMP     Component Value Date/Time   NA 136 01/19/2023 0958   NA 142 02/27/2020 0924   NA 143 12/22/2016 0850   K 4.4 01/19/2023 0958   K 4.3 12/22/2016 0850   CL 106 01/19/2023 0958   CO2 22 01/19/2023 0958   CO2 25 12/22/2016 0850   GLUCOSE 106 (H) 01/19/2023 0958   GLUCOSE 90 12/22/2016 0850   BUN 15 01/19/2023 0958   BUN 11 02/27/2020 0924   BUN 19.5 12/22/2016 0850   CREATININE 1.30 (H) 01/19/2023 0958   CREATININE 1.61 (H) 11/18/2022 0953   CREATININE 1.09 (H) 12/31/2019 1155   CREATININE 1.2 (H) 12/22/2016 0850   CALCIUM  9.9 01/19/2023 0958   CALCIUM  9.8 12/22/2016 0850   PROT 6.4 (L) 01/19/2023 0958   PROT 7.0 12/22/2016 0850   ALBUMIN  3.0 (L) 01/19/2023 0958   ALBUMIN  3.8 12/22/2016 0850   AST 20 01/19/2023 0958   AST 18 11/18/2022 0953   AST 13 12/22/2016 0850   ALT 18 01/19/2023 0958   ALT 14 11/18/2022 0953   ALT 9 12/22/2016 0850   ALKPHOS 77 01/19/2023 0958   ALKPHOS 74 12/22/2016 0850    BILITOT 0.6 01/19/2023 0958   BILITOT 0.8 11/18/2022 0953   BILITOT 0.70 12/22/2016 0850   GFRNONAA 42 (L) 01/19/2023 0958   GFRNONAA 33 (L) 11/18/2022 0953   GFRNONAA 50 (L) 12/31/2019 1155   GFRAA 57 (L) 02/27/2020 0924   GFRAA 58 (L) 12/31/2019 1155     ASSESSMENT & PLAN: 78 yo female   1. 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 Oncotype 4 -s/p lumpectomy 12/2016 and adjuvant radiation 03/2017 - 04/2017, and adjuvant Tamoxifen  06/2017 - 09/2020 stopped due to postmenopausal bleeding -Currently on surveillance .    2.  Mild thrombocytopenia, likely ITP -She has had a mild thrombocytopenia since at least 10/2015, lowest platelet 66K -CT abdomen from 07/2018 which showed cysts in the liver.   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 Lanny started her on Promacta   3. Osteoporosis and back pain -10/2018 DEXA shows osteoporosis with lowest T score -2.8, on Fosamax  -Proceed with repeat DEXA to reevaluate and determine if she is a candidate for AI.  Consider changing Fosamax  to Zometa infusions   4. CAD, Afib, CHF, Aortic Stenosis, H/o Stroke  -clinically stable  -On Coumadin  INR closely monitored per PCP    PLAN:  No orders of the defined types were placed in this encounter.     All questions were answered. The patient knows to call the clinic with any problems, questions or concerns. No barriers to learning were detected. I spent *** counseling the patient face to face. The total time spent in the appointment was *** and more than 50% was on counseling, review of test results,  and coordination of care.   Burlin Mcnair, NP-C @DATE @

## 2023-03-15 ENCOUNTER — Other Ambulatory Visit: Payer: Medicare HMO | Attending: Hematology

## 2023-03-15 ENCOUNTER — Telehealth: Payer: Self-pay | Admitting: *Deleted

## 2023-03-15 ENCOUNTER — Other Ambulatory Visit: Payer: Self-pay

## 2023-03-15 ENCOUNTER — Ambulatory Visit: Payer: Medicare HMO | Admitting: Nurse Practitioner

## 2023-03-15 NOTE — Telephone Encounter (Signed)
 Patient missed appts today 03/14/23 at [facility] for lab and NP Contacted patient - she said car service provided by [facility] never arrived. Provider informed of reason patient missed appt.  Schedule message sent to r/s patient's appt. Provided info that patient uses [facility] transportation.

## 2023-03-18 ENCOUNTER — Other Ambulatory Visit: Payer: Self-pay

## 2023-03-21 ENCOUNTER — Other Ambulatory Visit: Payer: Self-pay

## 2023-03-23 ENCOUNTER — Other Ambulatory Visit: Payer: Self-pay

## 2023-03-23 ENCOUNTER — Telehealth: Payer: Self-pay | Admitting: Family Medicine

## 2023-03-23 MED ORDER — ALENDRONATE SODIUM 70 MG PO TABS: 70.0000 mg | ORAL_TABLET | ORAL | 0 refills | Status: DC

## 2023-03-23 NOTE — Telephone Encounter (Signed)
 Duplicate

## 2023-03-23 NOTE — Telephone Encounter (Signed)
Rx sent 

## 2023-03-23 NOTE — Telephone Encounter (Signed)
Copied from CRM [id number]. Topic: Clinical - Medication Refill >> Mar 23, 2023  9:16 AM Leavy Cella D wrote: Most Recent Primary Care Visit:  Provider: Tillie Rung  Department: [facility]  Visit Type: MEDICARE AWV, SEQUENTIAL  Date: 02/04/2023  Medication: alendionate 75mg    Has the patient contacted their pharmacy? Yes (Agent: If no, request that the patient contact the pharmacy for the refill. If patient does not wish to contact the pharmacy document the reason why and proceed with request.) (Agent: If yes, when and what did the pharmacy advise?) [facility] pharmacy called to get status of medication request   Is this the correct pharmacy for this prescription? Yes If no, delete pharmacy and type the correct one.  This is the patient's preferred pharmacy:   [facility] - [place], [place] - [address] [place] [address] Phone: [phone] Fax: [phone]    Has the prescription been filled recently? No  Is the patient out of the medication? Yes  Has the patient been seen for an appointment in the last year OR does the patient have an upcoming appointment? Yes  Can we respond through MyChart? No  Agent: Please be advised that Rx refills may take up to 3 business days. We ask that you follow-up with your pharmacy.

## 2023-03-23 NOTE — Telephone Encounter (Signed)
Copied from CRM [id number]. Topic: Clinical - Prescription Issue >> Mar 23, 2023  9:19 AM Leavy Cella D wrote: Reason for CRM: Burna Mortimer from [facility] called in regards to medication alendionate 75mg  . Pharmacy stated they have not received script for medication .

## 2023-03-24 ENCOUNTER — Other Ambulatory Visit: Payer: Self-pay

## 2023-03-28 ENCOUNTER — Other Ambulatory Visit: Payer: Self-pay

## 2023-03-30 ENCOUNTER — Other Ambulatory Visit: Payer: Self-pay

## 2023-04-04 ENCOUNTER — Telehealth: Payer: Self-pay | Admitting: Pharmacy Technician

## 2023-04-04 ENCOUNTER — Other Ambulatory Visit: Payer: Self-pay

## 2023-04-04 ENCOUNTER — Other Ambulatory Visit (HOSPITAL_COMMUNITY): Payer: Self-pay

## 2023-04-04 DIAGNOSIS — D696 Thrombocytopenia, unspecified: Secondary | ICD-10-CM

## 2023-04-04 MED ORDER — ELTROMBOPAG OLAMINE 25 MG PO TABS
25.0000 mg | ORAL_TABLET | Freq: Every day | ORAL | 3 refills | Status: DC
  Filled 2023-04-08: qty 30, 30d supply, fill #0

## 2023-04-04 NOTE — Telephone Encounter (Signed)
Oral Oncology Patient Advocate Encounter  Test claim on refill of Promacta shows that [facility] insurance has been terminated as of  04/01/23.  I called and spoke with the patient and she was unaware of this change. She is going to speak with [facility] and work to get her insurance reinstated. I advised her to reach out to [facility] once this has been corrected. Patient states she has the phone number for pharmacy to call back once she has it resolved.  Jinger Neighbors, CPhT-Adv Oncology Pharmacy Patient Advocate [facility] Direct Number: [phone]  Fax: [phone]

## 2023-04-05 ENCOUNTER — Other Ambulatory Visit (HOSPITAL_COMMUNITY): Payer: Self-pay

## 2023-04-07 ENCOUNTER — Other Ambulatory Visit (HOSPITAL_COMMUNITY): Payer: Self-pay

## 2023-04-08 ENCOUNTER — Other Ambulatory Visit: Payer: Self-pay

## 2023-04-22 ENCOUNTER — Other Ambulatory Visit (HOSPITAL_COMMUNITY): Payer: Self-pay

## 2023-05-03 ENCOUNTER — Other Ambulatory Visit (HOSPITAL_COMMUNITY): Payer: Self-pay

## 2023-05-03 ENCOUNTER — Other Ambulatory Visit: Payer: Self-pay | Admitting: Pharmacy Technician

## 2023-05-03 ENCOUNTER — Telehealth (HOSPITAL_COMMUNITY): Payer: Self-pay | Admitting: Licensed Clinical Social Worker

## 2023-05-03 ENCOUNTER — Telehealth (HOSPITAL_COMMUNITY): Payer: Self-pay

## 2023-05-03 ENCOUNTER — Telehealth (HOSPITAL_COMMUNITY): Payer: Self-pay | Admitting: Cardiology

## 2023-05-03 NOTE — Telephone Encounter (Signed)
 Pt need transportation assist to appt. tomorrow 03/05, appt time 12pm. Thanks

## 2023-05-03 NOTE — Telephone Encounter (Signed)
 Called patient to confirm appointment for tomorrow with APP- per patients family- patient will need transportation for wheel chair to appointment. Spoke with LCSW Annice Pih) she will reach out to patients family- though transport at this time may not be an option. Will follow back up.

## 2023-05-03 NOTE — Telephone Encounter (Signed)
 H&V Care Navigation CSW Progress Note  Clinical Social Worker contacted patient by phone to assist with transportation.  Patient is participating in a Managed Medicaid Plan:  No  Patient called to request assistance with transportation to appointment. CSW contacted [employer] to arrange as patient requires wheelchair. Patient informed of pick up time and waiver reviewed. [name], LCSW, CCSW-MCS [phone]   SDOH Screenings   Food Insecurity: No Food Insecurity (02/04/2023)  Housing: Low Risk  (02/04/2023)  Transportation Needs: Unmet Transportation Needs (05/03/2023)  Utilities: Not At Risk (12/25/2022)  Alcohol Screen: Low Risk  (02/04/2023)  Depression (PHQ2-9): Low Risk  (02/04/2023)  Financial Resource Strain: Low Risk  (02/04/2023)  Physical Activity: Insufficiently Active (02/04/2023)  Social Connections: Socially Integrated (02/04/2023)  Stress: No Stress Concern Present (02/04/2023)  Tobacco Use: Medium Risk (02/04/2023)  Health Literacy: Adequate Health Literacy (02/04/2023)    05/03/2023  Danielle [name] DOB: 20-Jun-1945 MRN: [id number]   RIDER WAIVER AND RELEASE OF LIABILITY  For the purposes of helping with transportation needs, [facility] partners with outside transportation providers (taxi companies, [place], Catering manager.) to give [employer] patients or other approved people the choice of on-demand rides [employer]") to our buildings for non-emergency visits.  By using [employer], I, the person signing this document, on behalf of myself and/or any legal minors (in my care using the [employer]), agree:  Science writer given to me are supplied by independent, outside transportation providers who do not work for, or have any affiliation with, [employer]. [place] is not a transportation company. [place] has no control over the quality or safety of the rides I get using [employer]. [place] has no control over whether  any outside ride will happen on time or not. [place] gives no guarantee on the reliability, quality, safety, or availability on any rides, or that no mistakes will happen. I know and accept that traveling by vehicle (car, truck, SVU, Zenaida Niece, bus, taxi, etc.) has risks of serious injuries such as disability, being paralyzed, and death. I know and agree the risk of using [employer] is mine alone, and not [employer]. Transport Services are provided "as is" and as are available. The transportation providers are in charge for all inspections and care of the vehicles used to provide these rides. I agree not to take legal action against [employer], its agents, employees, officers, directors, representatives, insurers, attorneys, assigns, successors, subsidiaries, and affiliates at any time for any reasons related directly or indirectly to using [employer]. I also agree not to take legal action against [place] or its affiliates for any injury, death, or damage to property caused by or related to using [employer]. I have read this Waiver and Release of Liability, and I understand the terms used in it and their legal meaning. This Waiver is freely and voluntarily given with the understanding that my right (or any legal minors) to legal action against [place] relating to [employer] is knowingly given up to use these services.   I attest that I read the Ride Waiver and Release of Liability to [name], gave Ms. [name] the opportunity to ask questions and answered the questions asked (if any). I affirm that [name] then provided consent for assistance with transportation.     Marcy Siren

## 2023-05-03 NOTE — Progress Notes (Signed)
 Unable to reach patient to correct insurance or set up next fill. Will reactivate if resolved

## 2023-05-04 ENCOUNTER — Ambulatory Visit (HOSPITAL_COMMUNITY)
Admission: RE | Admit: 2023-05-04 | Discharge: 2023-05-04 | Disposition: A | Discharge status: Home / Self Care | Source: Ambulatory Visit | Attending: Adult Health | Admitting: Adult Health

## 2023-05-04 ENCOUNTER — Other Ambulatory Visit (HOSPITAL_COMMUNITY): Payer: Self-pay

## 2023-05-04 ENCOUNTER — Encounter (HOSPITAL_COMMUNITY): Payer: Self-pay

## 2023-05-04 VITALS — BP 108/68 | HR 73 | Ht 67.0 in | Wt 234.0 lb

## 2023-05-04 DIAGNOSIS — Z79899 Other long term (current) drug therapy: Secondary | ICD-10-CM | POA: Diagnosis not present

## 2023-05-04 DIAGNOSIS — I428 Other cardiomyopathies: Secondary | ICD-10-CM | POA: Diagnosis not present

## 2023-05-04 DIAGNOSIS — I351 Nonrheumatic aortic (valve) insufficiency: Secondary | ICD-10-CM | POA: Insufficient documentation

## 2023-05-04 DIAGNOSIS — Z86718 Personal history of other venous thrombosis and embolism: Secondary | ICD-10-CM | POA: Insufficient documentation

## 2023-05-04 DIAGNOSIS — Z6836 Body mass index (BMI) 36.0-36.9, adult: Secondary | ICD-10-CM | POA: Diagnosis not present

## 2023-05-04 DIAGNOSIS — E669 Obesity, unspecified: Secondary | ICD-10-CM | POA: Insufficient documentation

## 2023-05-04 DIAGNOSIS — I130 Hypertensive heart and chronic kidney disease with heart failure and stage 1 through stage 4 chronic kidney disease, or unspecified chronic kidney disease: Secondary | ICD-10-CM | POA: Diagnosis not present

## 2023-05-04 DIAGNOSIS — Z7901 Long term (current) use of anticoagulants: Secondary | ICD-10-CM | POA: Insufficient documentation

## 2023-05-04 DIAGNOSIS — I5022 Chronic systolic (congestive) heart failure: Secondary | ICD-10-CM

## 2023-05-04 DIAGNOSIS — I10 Essential (primary) hypertension: Secondary | ICD-10-CM

## 2023-05-04 DIAGNOSIS — Z953 Presence of xenogenic heart valve: Secondary | ICD-10-CM | POA: Insufficient documentation

## 2023-05-04 DIAGNOSIS — Z7984 Long term (current) use of oral hypoglycemic drugs: Secondary | ICD-10-CM | POA: Diagnosis not present

## 2023-05-04 DIAGNOSIS — I2510 Atherosclerotic heart disease of native coronary artery without angina pectoris: Secondary | ICD-10-CM | POA: Diagnosis not present

## 2023-05-04 DIAGNOSIS — N1832 Chronic kidney disease, stage 3b: Secondary | ICD-10-CM | POA: Insufficient documentation

## 2023-05-04 DIAGNOSIS — Z87891 Personal history of nicotine dependence: Secondary | ICD-10-CM | POA: Diagnosis not present

## 2023-05-04 DIAGNOSIS — I252 Old myocardial infarction: Secondary | ICD-10-CM | POA: Diagnosis not present

## 2023-05-04 DIAGNOSIS — Z8616 Personal history of COVID-19: Secondary | ICD-10-CM | POA: Insufficient documentation

## 2023-05-04 DIAGNOSIS — E785 Hyperlipidemia, unspecified: Secondary | ICD-10-CM | POA: Diagnosis not present

## 2023-05-04 DIAGNOSIS — E0590 Thyrotoxicosis, unspecified without thyrotoxic crisis or storm: Secondary | ICD-10-CM | POA: Diagnosis not present

## 2023-05-04 DIAGNOSIS — Z8673 Personal history of transient ischemic attack (TIA), and cerebral infarction without residual deficits: Secondary | ICD-10-CM | POA: Diagnosis not present

## 2023-05-04 DIAGNOSIS — I480 Paroxysmal atrial fibrillation: Secondary | ICD-10-CM

## 2023-05-04 DIAGNOSIS — Z86711 Personal history of pulmonary embolism: Secondary | ICD-10-CM

## 2023-05-04 DIAGNOSIS — E66812 Obesity, class 2: Secondary | ICD-10-CM

## 2023-05-04 LAB — BASIC METABOLIC PANEL
Anion gap: 12 (ref 5–15)
BUN: 15 mg/dL (ref 8–23)
CO2: 23 mmol/L (ref 22–32)
Calcium: 10.1 mg/dL (ref 8.9–10.3)
Chloride: 105 mmol/L (ref 98–111)
Creatinine, Ser: 1.3 mg/dL — ABNORMAL HIGH (ref 0.44–1.00)
GFR, Estimated: 42 mL/min — ABNORMAL LOW (ref >60)
Glucose, Bld: 95 mg/dL (ref 70–99)
Potassium: 4.7 mmol/L (ref 3.5–5.1)
Sodium: 140 mmol/L (ref 135–145)

## 2023-05-04 LAB — LIPID PANEL
Cholesterol: 210 mg/dL — ABNORMAL HIGH (ref 0–200)
HDL: 61 mg/dL (ref >40)
LDL Cholesterol: 135 mg/dL — ABNORMAL HIGH (ref 0–99)
Total CHOL/HDL Ratio: 3.4 ratio
Triglycerides: 72 mg/dL (ref <150)
VLDL: 14 mg/dL (ref 0–40)

## 2023-05-04 LAB — BRAIN NATRIURETIC PEPTIDE: B Natriuretic Peptide: 115.5 pg/mL — ABNORMAL HIGH (ref 0.0–100.0)

## 2023-05-04 MED ORDER — SPIRONOLACTONE 25 MG PO TABS: 25.0000 mg | ORAL_TABLET | Freq: Every day | ORAL | 3 refills | Status: DC

## 2023-05-04 MED ORDER — APIXABAN 5 MG PO TABS: 5.0000 mg | ORAL_TABLET | Freq: Two times a day (BID) | ORAL | 1 refills | Status: AC

## 2023-05-04 MED ORDER — ROSUVASTATIN CALCIUM 20 MG PO TABS: 20.0000 mg | ORAL_TABLET | Freq: Every day | ORAL | 3 refills | Status: DC

## 2023-05-04 NOTE — Patient Instructions (Addendum)
 Medication Changes:  STOP Entresto STOP Torsemide RESTART Crestor 20mg  (1 tab) daily RESTART Spironolactone 25mg  (1 tab) daily  START FARXIGA- ONCE APPROVED WITH INSURANCE- OUR PHARMACY TEAM WILL CALL YOU    Lab Work:  Labs done today, your results will be available in MyChart, we will contact you for abnormal readings.  You will need to come back in 7-10 days for additional lab work to be drawn.   Special Instructions // Education:  Please follow up with Dr. Latanya Maudlin office. Their number is [phone]  You have been referred to the pharmacist AT THE [facility] OFFICE. They will be calling you in order to schedule an appointment.  Follow-Up in: Please follow up with the [facility] in 2 months with Dr. Shirlee Latch. We currently do not have that schedule. Please call back in April in order to schedule that appointment for May.  At the [facility], you and your health needs are our priority. We have a designated team specialized in the treatment of Heart Failure. This Care Team includes your primary Heart Failure Specialized Cardiologist (physician), Advanced Practice Providers (APPs- Physician Assistants and Nurse Practitioners), and Pharmacist who all work together to provide you with the care you need, when you need it.   You may see any of the following providers on your designated Care Team at your next follow up:  Dr. Arvilla Meres Dr. Marca Ancona Dr. Dorthula Nettles Dr. Theresia Bough Tonye Becket, NP Robbie Lis, [place] [facility] [place], [place] Brynda Peon, NP [place] Lee, NP Karle Plumber, PharmD   Please be sure to bring in all your medications bottles to every appointment.   Need to Contact us:  If you have any questions or concerns before your next appointment please send [place] a message through [place] or call our office at [phone].    TO LEAVE A MESSAGE FOR THE NURSE SELECT OPTION 2, PLEASE LEAVE A MESSAGE  INCLUDING: YOUR NAME DATE OF BIRTH CALL BACK NUMBER REASON FOR CALL**this is important as we prioritize the call backs  YOU WILL RECEIVE A CALL BACK THE SAME DAY AS LONG AS YOU CALL BEFORE 4:00 PM

## 2023-05-04 NOTE — Progress Notes (Signed)
 ADVANCED HF CLINIC NOTE   PCP: [place], [name], MD Primary Cardiolog: Dr. Okey [name] HF Cardiology: Dr. Shirlee Latch  Chief Complaint: Heart Failure Follow-up HPI: 78 y.o. 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  Echo in 9/21, EF 45-50%. LHC in 1/22 showed mild nonobstructive CAD.    Cardiac MRI (10/22) with LV EF 41%, normal RV, subendocardial LGE in the mid-apical inferior wall and the mid-apical inferolateral wall.   Echo 12/22, EF around 35%, RV poorly visualized, the bioprosthetic aortic valve looked ok.    Echo 11/23, EF up to 50% with mild LVH, RV mildly enlarged with normal systolic function, bioprosthetic aortic valve functioning normally.   Last seen 5/24 in HF Clinic. At that time was having increasing shortness of breath and edema. Torsemide increased. Weight was 240lbs.  Admitted in 10/24 for acute hypoxia in the setting of pneumonia, PE (by V/Q scan and + DVTS), and COVID +. Echo at the time with very poor window, EF thought mildly reduced, RV appeared mod reduced. Started on Eliquis.  Today she returns for HF follow up with brother. Overall feeling fine. Denies increasing SOB, palpitations, abnormal bleeding, CP, dizziness, edema, or PND/Orthopnea. Mostly gets around by wheelchair, family makes her ambulated for exercise a few times a day. Appetite ok. No fever or chills. Taking all medications. Medication list provided with duplicate medications and hand written medication notes. Will provide a new list this visit.   Social History   Socioeconomic History   Marital status: Married    Spouse name: Danielle [name]   Number of children: 0   Years of education: 12   Highest education level: Bachelor's degree (e.[name]., BA, AB, BS)  Occupational History   Occupation: Retired in 2004  Tobacco Use   Smoking status: Former     Current packs/day: 0.00    Average packs/day: 0.3 packs/day for 10.0 years (2.5 ttl pk-yrs)    Types: Cigarettes    Start date: 2002    Quit date: 2012    Years since quitting: 13.1   Smokeless tobacco: Never  Vaping Use   Vaping status: Never Used  Substance and Sexual Activity   Alcohol use: No   Drug use: Never   Sexual activity: Yes    Birth control/protection: Post-menopausal  Other Topics Concern   Not on file  Social History Narrative   Lives with husband.  Ambulated independently.   Right-handed.   No daily caffeine use.   Social Drivers of Corporate investment banker Strain: Low Risk  (02/04/2023)   Overall Financial Resource Strain (CARDIA)    Difficulty of Paying Living Expenses: Not hard at all  Food Insecurity: No Food Insecurity (02/04/2023)   Hunger Vital Sign    Worried About Running Out of Food in the Last Year: Never true    Ran Out of Food in the Last Year: Never true  Transportation Needs: Unmet Transportation Needs (05/03/2023)   PRAPARE - Administrator, Civil Service (Medical): Yes    Lack of Transportation (Non-Medical): Yes  Physical Activity: Insufficiently Active (02/04/2023)   Exercise Vital Sign    Days of Exercise per Week: 2 days    Minutes of Exercise per Session: 20 min  Stress: No Stress Concern Present (02/04/2023)   [name]    Feeling of Stress : Not  at all  Social Connections: Socially Integrated (02/04/2023)   Social Connection and Isolation Panel [NHANES]    Frequency of Communication with Friends and Family: More than three times a week    Frequency of Social Gatherings with Friends and Family: More than three times a week    Attends Religious Services: More than 4 times per year    Active Member of [name]: Yes    Attends Engineer, structural: More than 4 times per year    Marital Status: Married  Catering manager Violence: Not At  Risk (02/04/2023)   Humiliation, Afraid, Rape, and Kick questionnaire    Fear of Current or Ex-Partner: No    Emotionally Abused: No    Physically Abused: No    Sexually Abused: No   Family History  Problem Relation Age of Onset   Diabetes Mother    Heart attack Mother 27   Diabetes Father    Lung cancer Father    Diabetes Sister    Thyroid disease Sister    Diabetes Sister    HIV Brother    Colon polyps Neg Hx    Colon cancer Neg Hx    ROS: All systems reviewed and negative except as per HPI.   Current Outpatient Medications  Medication Sig Dispense Refill   acetaminophen (TYLENOL) 325 MG tablet Take 2 tablets (650 mg total) by mouth every 6 (six) hours as needed for mild pain (pain score 1-3), fever or headache.     alendronate (FOSAMAX) 70 MG tablet Take 1 tablet (70 mg total) by mouth every 7 (seven) days. Take with a full glass of water on an empty stomach. 12 tablet 0   apixaban (ELIQUIS) 5 MG TABS tablet Take 1 tablet (5 mg total) by mouth 2 (two) times daily. 180 tablet 1   carvedilol (COREG) 25 MG tablet Take 1 tablet (25 mg total) by mouth 2 (two) times daily with a meal. 180 tablet 3   eltrombopag (PROMACTA) 25 MG tablet Take 1 tablet (25 mg total) by mouth daily. Take on an empty stomach, 1 hour before a meal or 2 hours after. 30 tablet 3   hydrOXYzine (VISTARIL) 25 MG capsule Take 25 mg by mouth daily.     losartan (COZAAR) 25 MG tablet Take 1 tablet (25 mg total) by mouth daily. 30 tablet 2   methimazole (TAPAZOLE) 5 MG tablet Take 5 mg by mouth 3 (three) times daily.     omeprazole (PRILOSEC) 40 MG capsule Take 1 capsule (40 mg total) by mouth daily. (Patient taking differently: Take 40 mg by mouth daily as needed (heartburn).) 60 capsule 0   polyethylene glycol (MIRALAX / GLYCOLAX) 17 [name] packet Take 17 [name] by mouth daily as needed for moderate constipation.     rosuvastatin (CRESTOR) 40 MG tablet Take 40 mg by mouth daily.     sertraline (ZOLOFT) 50 MG tablet Take 50 mg  by mouth daily.     spironolactone (ALDACTONE) 25 MG tablet Take 1 tablet (25 mg total) by mouth daily. 90 tablet 3   traMADol (ULTRAM) 50 MG tablet Take 50 mg by mouth every 8 (eight) hours as needed.     trolamine salicylate (ASPERCREME) 10 % cream Apply 1 application topically as needed for muscle pain.     zolpidem (AMBIEN) 5 MG tablet Take 5 mg by mouth at bedtime as needed for sleep.     No current facility-administered medications for this encounter.   Wt Readings from Last  3 Encounters:  05/04/23 106.1 kg (234 lb)  02/04/23 112 kg (247 lb)  01/19/23 107.7 kg (237 lb 7 oz)   BP 108/68   Pulse 73   Ht 5\' 7"  (1.702 m)   Wt 106.1 kg (234 lb)   SpO2 95%   BMI 36.65 kg/m   Physical Exam: General: Elderly appearing. [facility]rived in [facility] Cardiac: JVP not elevated. S1 and S2 present. No murmurs or rub. Extremities: Warm and dry. No rash, cyanosis.  Trace BLE edema.  Neuro: Alert and oriented x3. Looks to family member for assistance answering questions.  ReDs: 31%, normal  ECG: NSR 72 bpm with 1AVB (personally reviewed)  Assessment/Plan: 1. Chronic HF with mid range EF: NICM. Echo in 9/21 showed EF 45-50%, global hypokinesis, mild LVH, bioprosthetic aortic valve mean gradient 10 mmHg, normal RV.  Cause of cardiomyopathy is uncertain, mild CAD on 1/22 cath.  It is possible that this is chronic effect of long-standing AI that was not improved with valve replacement.  Also consider long-standing HTN. CMRI in 10/22 showed LV EF 41%, normal RV, subendocardial LGE in the mid-apical inferior wall and the mid-apical inferolateral wall. The LGE pattern does not look like cardiac amyloidosis.  The subendocardial LGE looks like it came from prior MI but minimal CAD on cath => ?prior embolism down a coronary related to atrial fibrillation. Echo 11/23 EF up to 50% with mild LVH, RV mildly enlarged with normal systolic function, bioprosthetic aortic valve functioning normally. Echo 10/24 40-45%, mod  reduced RV. - NYHA class III. Appears dry, may by mildly volume up by exam, difficult to assess. - Would hold off on diuretic with resuming GDMT and SGLT2i.  - Retrial Farxiga 10 mg daily (prev stopped for a yeast infection) - Continue Coreg 25 mg bid.   - Continue Losartan 25 mg daily - Continue Spironolactone 25 mg daily.  - Patient provided medication list with multiple discrepancies. Will given an updated list of medications this visit. - BMET/BNP today. Repeat BMET/BNP 1 week  2. Aortic valve disorders: Per report, had severe AI.  Now s/p bioprosthetic aortic valve replacement.  The valve functions normally on 10/24 echo.   3. PE  - noted by V/Q during 10/24 admission - possibly in the setting of hypercoagulable state (COVID+) and PAF - on eliquis 5 mg bid  4. HTN: BP controlled.  5. Atrial fibrillation: Paroxysmal.  - ECG with NSR today - Continue Eliquis, no bleeding  6. CKD 3b: Baseline Cr 1.3-1.5.  7. Obesity: Body mass index is 36.65 kg/m. - Refer to PharmD Clinic for GLP1  8. HLD - restart Crestor 20 mg daily - check Lipids today  Follow up with Dr. Shirlee Latch in 2 months. Labs in 1 week.   [place] Zerick Prevette, NP 05/04/2023

## 2023-05-04 NOTE — Progress Notes (Signed)
 ReDS Vest / Clip - 05/04/23 1200       ReDS Vest / Clip   Station Marker C    Ruler Value 29    ReDS Value Range Low volume    ReDS Actual Value 31

## 2023-05-05 ENCOUNTER — Encounter: Payer: Self-pay | Admitting: *Deleted

## 2023-05-06 ENCOUNTER — Telehealth (HOSPITAL_COMMUNITY): Payer: Self-pay | Admitting: Licensed Clinical Social Worker

## 2023-05-06 NOTE — Telephone Encounter (Signed)
 CSW received referral to provide assistance with transportation to appointment on 05-16-23. CSW contacted patient's family and reviewed transportation benefit with insurance. CSW instructed to call member services to inquire about options with [employer]. Patient's family verbalizes  understanding of follow up and have CSW contact information. [name], LCSW, CCSW-MCS [phone]

## 2023-05-07 ENCOUNTER — Encounter: Payer: Self-pay | Admitting: *Deleted

## 2023-05-07 NOTE — Congregational Nurse Program (Signed)
 Dept: [phone]   Congregational Nurse Program Note  Date of Encounter: 05/07/2023  Past Medical History: Past Medical History:  Diagnosis Date   Anticoagulated on Coumadin    managed by cardiology   Anxiety    Chronic constipation    Chronic systolic CHF (congestive heart failure) (HCC) 2016   cardiologist--- dr end and followed by CHF clinic   CKD (chronic kidney disease), stage III (HCC)    COPD (chronic obstructive pulmonary disease) (HCC)    previous had seen pulmonology --- dr Sherene Sires, note in epic 03-14-2016, dx COPD GOLD 0   Coronary artery disease    cardiologist--- dr Bethena Roys;  last cardiac cath 01-10-20202 mild nonobstructive disease proxLAD;  per pt, had LHC prior to AVR in [place] 2016   DOE (dyspnea on exertion)    GERD (gastroesophageal reflux disease)    History of 2019 novel coronavirus disease (COVID-19) 12/19/2019   hospital admission w/ covid pneumonia , no intubatiion,  per pt symptoms resolved and back to baseline   History of cerebrovascular accident (CVA) with residual deficit    CVA in 2000 without residuals;  CVA post op AVR 01/ 2016 with residual right hand weakness   History of gastritis    History of pneumothorax    s/p right vats 11-19-2014 and  s/p left vats  12-13-2015   (both spontenaous due to bleb)   Hyperlipidemia    Hypertension    Hyperthyroidism    per pt followed by pcp,  dx approx 2014   Malignant neoplasm of upper-outer quadrant of left breast in female, 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   oncologist-- dr Mosetta Putt--- dx 10/ 2018  ,  s/p left lumptectomy with node dissection;  completed radiation 04-15-2017, no chemo   Multiple thyroid nodules    in care everywhere pt had left thyroid nodule biospy 09-18-2014 benign   Nausea, vomiting, and diarrhea 12/25/2022   NICM (nonischemic cardiomyopathy) (HCC) 2016   2016  ef 30%;  last echo in epic ef 45%   OA (osteoarthritis)    Osteoporosis    Paroxysmal atrial fibrillation [facility])     cardiologist--- dr end   Personal history of radiation therapy    left breast cancer  03-08-2017  to 04-05-2017   Thrombocytopenia [facility])    followed by dr Mosetta Putt   Valvular stenosis, aortic 03/12/2014   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   Wears dentures    full upper    Encounter Details:  Community Questionnaire - 05/07/23 1944       Questionnaire   Ask client: Do you give verbal consent for me to treat you today? Yes    Student Assistance N/A    Location Patient Served  True Conservation officer, nature    Encounter Setting Home    Population Status Unknown    Insurance Medicaid    Insurance/Financial Assistance Referral N/A    Medication N/A    Medical Provider No    Screening Referrals Made N/A    Medical Referrals Made N/A    Medical Appointment Completed N/A    CNP Interventions Referred to MyChart;Set up MyChart    Screenings CN Performed N/A    ED Visit Averted N/A    Life-Saving Intervention Made N/A            Mrs Nolen Mu requested assistance with logging into my chart and allow her brother in law to gain access. This was completed per request. Consuello Masse

## 2023-05-09 ENCOUNTER — Telehealth (HOSPITAL_COMMUNITY): Payer: Self-pay | Admitting: Licensed Clinical Social Worker

## 2023-05-09 NOTE — Telephone Encounter (Signed)
 H&V Care Navigation CSW Progress Note  Clinical Social Worker received call from pt mother in law to inquire about transportation for appt next week.  CSW coworker had spoken with them last week and relayed insurance contact info to discuss transportation-  they have not called- number provided again- encouraged them to call and then call me back if there are any issues.  Mother in law then asked about mental status evaluation- CSW explained this would be something they should call PCP regarding as they would be able to do initial evaluation and then refer as appropriate.  They are concerned about pt care needs at home given physical weakness and memory concerns.  Pt makes $2,000/month so not eligible for Medicaid- explained very limited in home care options without medicaid other than private pay options which is not affordable.  Discussed [facility] program as possible solution- provided with their number and encouraged them to reach out to learn more about their program.  No further needs at this time  SDOH Screenings   Food Insecurity: No Food Insecurity (02/04/2023)  Housing: Low Risk  (02/04/2023)  Transportation Needs: Unmet Transportation Needs (05/03/2023)  Utilities: Not At Risk (12/25/2022)  Alcohol Screen: Low Risk  (02/04/2023)  Depression (PHQ2-9): Low Risk  (02/04/2023)  Financial Resource Strain: Low Risk  (02/04/2023)  Physical Activity: Insufficiently Active (02/04/2023)  Social Connections: Socially Integrated (02/04/2023)  Stress: No Stress Concern Present (02/04/2023)  Tobacco Use: Medium Risk (05/07/2023)  Health Literacy: Adequate Health Literacy (02/04/2023)   Burna Sis, LCSW Clinical Social Worker Advanced [facility] Failure Clinic Desk#: [phone] Cell#: [phone]

## 2023-05-10 ENCOUNTER — Telehealth: Payer: Self-pay | Admitting: Licensed Clinical Social Worker

## 2023-05-10 NOTE — Telephone Encounter (Signed)
 H&V Care Navigation CSW Progress Note  Clinical Social Worker contacted caregiver by phone (pt mother in law [place], [phone], to f/u on request for transportation. Was able to reach her and introduced self, role, reason for call. She shares that she called pts insurance plan and no ride benefits are available at this time. Reviewed ride previously scheduled with [employer] and confirm details. Will send in vouchers, if any additional f/u Eileen Stanford will return to clinic tomorrow but shared [place] is welcome to call me as needed. Will send in voucher and call [employer]. Pt will need to complete physical waiver at appt and discussed completing updated DPR with [place] during appt.  Patient is participating in a Managed Medicaid Plan:  No, Multimedia programmer  SDOH Screenings   Food Insecurity: No Food Insecurity (02/04/2023)  Housing: Low Risk  (02/04/2023)  Transportation Needs: Unmet Transportation Needs (05/03/2023)  Utilities: Not At Risk (12/25/2022)  Alcohol Screen: Low Risk  (02/04/2023)  Depression (PHQ2-9): Low Risk  (02/04/2023)  Financial Resource Strain: Low Risk  (02/04/2023)  Physical Activity: Insufficiently Active (02/04/2023)  Social Connections: Socially Integrated (02/04/2023)  Stress: No Stress Concern Present (02/04/2023)  Tobacco Use: Medium Risk (05/07/2023)  Health Literacy: Adequate Health Literacy (02/04/2023)   Octavio Graves, MSW, LCSW Clinical Social Worker II [facility] Health Heart/Vascular Care Navigation  [phone]- work cell phone (preferred) [phone]- desk phone  05/10/2023  Danielle [name] DOB: June 18, 1945 MRN: [id number]   RIDER WAIVER AND RELEASE OF LIABILITY  For the purposes of helping with transportation needs, [facility] partners with outside transportation providers (taxi companies, [place], Catering manager.) to give [employer] patients or other approved people the choice of on-demand rides [employer]") to our buildings  for non-emergency visits.  By using [employer], I, the person signing this document, on behalf of myself and/or any legal minors (in my care using the [employer]), agree:  Science writer given to me are supplied by independent, outside transportation providers who do not work for, or have any affiliation with, [employer]. [place] is not a transportation company. [place] has no control over the quality or safety of the rides I get using [employer]. [place] has no control over whether any outside ride will happen on time or not. [place] gives no guarantee on the reliability, quality, safety, or availability on any rides, or that no mistakes will happen. I know and accept that traveling by vehicle (car, truck, SVU, Zenaida Niece, bus, taxi, etc.) has risks of serious injuries such as disability, being paralyzed, and death. I know and agree the risk of using [employer] is mine alone, and not [employer]. Transport Services are provided "as is" and as are available. The transportation providers are in charge for all inspections and care of the vehicles used to provide these rides. I agree not to take legal action against [place], its agents, employees, officers, directors, representatives, insurers, attorneys, assigns, successors, subsidiaries, and affiliates at any time for any reasons related directly or indirectly to using [employer]. I also agree not to take legal action against [place] or its affiliates for any injury, death, or damage to property caused by or related to using [employer]. I have read this Waiver and Release of Liability, and I understand the terms used in it and their legal meaning. This Waiver is freely and voluntarily given with the understanding that my right (or any legal minors) to legal action against [place] relating to [employer]  is knowingly given up to use these services.   I attest that I read  the Ride Waiver and Release of Liability to Danielle [name], gave Danielle [name] the opportunity to ask questions and answered the questions asked (if any). I affirm that Danielle [name] then provided consent for assistance with transportation.     Terence Lux Vaiden Adames

## 2023-05-16 ENCOUNTER — Ambulatory Visit (HOSPITAL_COMMUNITY)
Admission: RE | Admit: 2023-05-16 | Discharge: 2023-05-16 | Disposition: A | Discharge status: Home / Self Care | Source: Ambulatory Visit | Attending: Cardiology | Admitting: Cardiology

## 2023-05-16 DIAGNOSIS — I5022 Chronic systolic (congestive) heart failure: Secondary | ICD-10-CM | POA: Diagnosis present

## 2023-05-16 LAB — BASIC METABOLIC PANEL
Anion gap: 8 (ref 5–15)
BUN: 13 mg/dL (ref 8–23)
CO2: 22 mmol/L (ref 22–32)
Calcium: 9.9 mg/dL (ref 8.9–10.3)
Chloride: 108 mmol/L (ref 98–111)
Creatinine, Ser: 1.49 mg/dL — ABNORMAL HIGH (ref 0.44–1.00)
GFR, Estimated: 36 mL/min — ABNORMAL LOW (ref >60)
Glucose, Bld: 89 mg/dL (ref 70–99)
Potassium: 4.8 mmol/L (ref 3.5–5.1)
Sodium: 138 mmol/L (ref 135–145)

## 2023-05-24 ENCOUNTER — Other Ambulatory Visit: Payer: Self-pay

## 2023-05-24 ENCOUNTER — Other Ambulatory Visit (HOSPITAL_COMMUNITY): Payer: Self-pay

## 2023-06-07 ENCOUNTER — Other Ambulatory Visit (HOSPITAL_COMMUNITY): Payer: Self-pay

## 2023-06-21 ENCOUNTER — Other Ambulatory Visit (HOSPITAL_COMMUNITY): Payer: Self-pay

## 2023-06-21 ENCOUNTER — Other Ambulatory Visit: Payer: Self-pay

## 2023-06-22 ENCOUNTER — Emergency Department (HOSPITAL_COMMUNITY)
Admission: EM | Admit: 2023-06-22 | Discharge: 2023-06-23 | Disposition: A | Discharge status: Home / Self Care | Attending: Emergency Medicine | Admitting: Emergency Medicine

## 2023-06-22 ENCOUNTER — Emergency Department (HOSPITAL_COMMUNITY)

## 2023-06-22 ENCOUNTER — Other Ambulatory Visit: Payer: Self-pay

## 2023-06-22 ENCOUNTER — Telehealth: Payer: Self-pay | Admitting: Nurse Practitioner

## 2023-06-22 ENCOUNTER — Encounter (HOSPITAL_COMMUNITY): Payer: Self-pay

## 2023-06-22 DIAGNOSIS — S3991XA Unspecified injury of abdomen, initial encounter: Secondary | ICD-10-CM | POA: Insufficient documentation

## 2023-06-22 DIAGNOSIS — S299XXA Unspecified injury of thorax, initial encounter: Secondary | ICD-10-CM | POA: Diagnosis not present

## 2023-06-22 DIAGNOSIS — W01198A Fall on same level from slipping, tripping and stumbling with subsequent striking against other object, initial encounter: Secondary | ICD-10-CM | POA: Insufficient documentation

## 2023-06-22 DIAGNOSIS — J449 Chronic obstructive pulmonary disease, unspecified: Secondary | ICD-10-CM | POA: Diagnosis not present

## 2023-06-22 DIAGNOSIS — M25562 Pain in left knee: Secondary | ICD-10-CM | POA: Insufficient documentation

## 2023-06-22 DIAGNOSIS — Y9301 Activity, walking, marching and hiking: Secondary | ICD-10-CM | POA: Insufficient documentation

## 2023-06-22 DIAGNOSIS — N189 Chronic kidney disease, unspecified: Secondary | ICD-10-CM | POA: Insufficient documentation

## 2023-06-22 DIAGNOSIS — I4891 Unspecified atrial fibrillation: Secondary | ICD-10-CM | POA: Insufficient documentation

## 2023-06-22 DIAGNOSIS — S0990XA Unspecified injury of head, initial encounter: Secondary | ICD-10-CM | POA: Insufficient documentation

## 2023-06-22 DIAGNOSIS — F0390 Unspecified dementia without behavioral disturbance: Secondary | ICD-10-CM | POA: Diagnosis not present

## 2023-06-22 DIAGNOSIS — T07XXXA Unspecified multiple injuries, initial encounter: Secondary | ICD-10-CM

## 2023-06-22 DIAGNOSIS — Z91010 Allergy to peanuts: Secondary | ICD-10-CM | POA: Diagnosis not present

## 2023-06-22 DIAGNOSIS — Y92009 Unspecified place in unspecified non-institutional (private) residence as the place of occurrence of the external cause: Secondary | ICD-10-CM

## 2023-06-22 DIAGNOSIS — I5020 Unspecified systolic (congestive) heart failure: Secondary | ICD-10-CM | POA: Diagnosis not present

## 2023-06-22 DIAGNOSIS — Z7901 Long term (current) use of anticoagulants: Secondary | ICD-10-CM | POA: Insufficient documentation

## 2023-06-22 DIAGNOSIS — Y92000 Kitchen of unspecified non-institutional (private) residence as  the place of occurrence of the external cause: Secondary | ICD-10-CM | POA: Insufficient documentation

## 2023-06-22 LAB — I-STAT CHEM 8, ED
BUN: 22 mg/dL (ref 8–23)
Calcium, Ion: 1.22 mmol/L (ref 1.15–1.40)
Chloride: 110 mmol/L (ref 98–111)
Creatinine, Ser: 1.4 mg/dL — ABNORMAL HIGH (ref 0.44–1.00)
Glucose, Bld: 101 mg/dL — ABNORMAL HIGH (ref 70–99)
HCT: 40 % (ref 36.0–46.0)
Hemoglobin: 13.6 g/dL (ref 12.0–15.0)
Potassium: 3.9 mmol/L (ref 3.5–5.1)
Sodium: 139 mmol/L (ref 135–145)
TCO2: 19 mmol/L — ABNORMAL LOW (ref 22–32)

## 2023-06-22 LAB — CBC WITH DIFFERENTIAL/PLATELET
Abs Immature Granulocytes: 0.04 10*3/uL (ref 0.00–0.07)
Basophils Absolute: 0 10*3/uL (ref 0.0–0.1)
Basophils Relative: 0 %
Eosinophils Absolute: 0.1 10*3/uL (ref 0.0–0.5)
Eosinophils Relative: 1 %
HCT: 38.2 % (ref 36.0–46.0)
Hemoglobin: 12.3 g/dL (ref 12.0–15.0)
Immature Granulocytes: 1 %
Lymphocytes Relative: 10 %
Lymphs Abs: 0.9 10*3/uL (ref 0.7–4.0)
MCH: 31.8 pg (ref 26.0–34.0)
MCHC: 32.2 g/dL (ref 30.0–36.0)
MCV: 98.7 fL (ref 80.0–100.0)
Monocytes Absolute: 0.5 10*3/uL (ref 0.1–1.0)
Monocytes Relative: 6 %
Neutro Abs: 6.9 10*3/uL (ref 1.7–7.7)
Neutrophils Relative %: 82 %
Platelets: 58 10*3/uL — ABNORMAL LOW (ref 150–400)
RBC: 3.87 MIL/uL (ref 3.87–5.11)
RDW: 13 % (ref 11.5–15.5)
WBC: 8.4 10*3/uL (ref 4.0–10.5)
nRBC: 0 % (ref 0.0–0.2)

## 2023-06-22 LAB — COMPREHENSIVE METABOLIC PANEL WITH GFR
ALT: 14 U/L (ref 0–44)
AST: 34 U/L (ref 15–41)
Albumin: 3.6 g/dL (ref 3.5–5.0)
Alkaline Phosphatase: 55 U/L (ref 38–126)
Anion gap: 8 (ref 5–15)
BUN: 22 mg/dL (ref 8–23)
CO2: 21 mmol/L — ABNORMAL LOW (ref 22–32)
Calcium: 9.9 mg/dL (ref 8.9–10.3)
Chloride: 107 mmol/L (ref 98–111)
Creatinine, Ser: 1.39 mg/dL — ABNORMAL HIGH (ref 0.44–1.00)
GFR, Estimated: 39 mL/min — ABNORMAL LOW (ref >60)
Glucose, Bld: 111 mg/dL — ABNORMAL HIGH (ref 70–99)
Potassium: 5.1 mmol/L (ref 3.5–5.1)
Sodium: 136 mmol/L (ref 135–145)
Total Bilirubin: 1 mg/dL (ref 0.0–1.2)
Total Protein: 6.9 g/dL (ref 6.5–8.1)

## 2023-06-22 LAB — TROPONIN I (HIGH SENSITIVITY)
Troponin I (High Sensitivity): 51 ng/L — ABNORMAL HIGH (ref <18)
Troponin I (High Sensitivity): 60 ng/L — ABNORMAL HIGH (ref <18)

## 2023-06-22 LAB — TYPE AND SCREEN
ABO/RH(D): O POS
Antibody Screen: NEGATIVE

## 2023-06-22 MED ORDER — IOHEXOL 350 MG/ML SOLN
75.0000 mL | Freq: Once | INTRAVENOUS | Status: AC | PRN
  Administered 2023-06-22: 75 mL via INTRAVENOUS

## 2023-06-22 MED ORDER — OXYCODONE-ACETAMINOPHEN 5-325 MG PO TABS
1.0000 | ORAL_TABLET | Freq: Once | ORAL | Status: AC
  Administered 2023-06-23: 1 via ORAL
  Filled 2023-06-22: qty 1

## 2023-06-22 NOTE — ED Triage Notes (Signed)
 Patient BIB [facility] from home due to fall. Patient was on toilet and fell on butt. Patient complains of left knee pain that is chronic. EMS reports initial BP was 70s systolic. After 500 mL fluids patient BP was 106/72. Patient denies head or neck tenderness.

## 2023-06-22 NOTE — ED Notes (Signed)
 Patient activated as lvl 2 trauma after arrival.

## 2023-06-22 NOTE — ED Provider Notes (Signed)
 [place] EMERGENCY DEPARTMENT AT [facility] Provider Note   CSN: [id number] Arrival date & time: 06/22/23  2008     History  Chief Complaint  Patient presents with   Danielle [name] is a 78 y.o. female.  With a history of atrial fibrillation on Eliquis , HFrEF, aortic valve replacement, COPD and CKD who presents to the ED after fall.  Patient was walking on her kitchen floor this evening when she slipped on the floor and fell backwards striking the back of her head.  No head trauma or loss of consciousness.   Fall       Home Medications Prior to Admission medications   Medication Sig Start Date End Date Taking? Authorizing Provider  acetaminophen  (TYLENOL ) 325 MG tablet Take 2 tablets (650 mg total) by mouth every 6 (six) hours as needed for mild pain (pain score 1-3), fever or headache. 01/19/23   Abbe Abate, MD  alendronate  (FOSAMAX ) 70 MG tablet Take 1 tablet (70 mg total) by mouth every 7 (seven) days. Take with a full glass of water on an empty stomach. 03/23/23   [place], [name], MD  apixaban  (ELIQUIS ) 5 MG TABS tablet Take 1 tablet (5 mg total) by mouth 2 (two) times daily. 05/04/23   Lee, [place], NP  carvedilol  (COREG ) 25 MG tablet Take 1 tablet (25 mg total) by mouth 2 (two) times daily with a meal. 08/30/22   [place], Arlice Bene, FNP  eltrombopag  (PROMACTA ) 25 MG tablet Take 1 tablet (25 mg total) by mouth daily. Take on an empty stomach, 1 hour before a meal or 2 hours after. 04/04/23   Sonja [place], MD  hydrOXYzine (VISTARIL) 25 MG capsule Take 25 mg by mouth daily. 04/13/23   [provider]  losartan  (COZAAR ) 25 MG tablet Take 1 tablet (25 mg total) by mouth daily. 02/09/23   [place], [name], MD  methimazole  (TAPAZOLE ) 5 MG tablet Take 5 mg by mouth 3 (three) times daily.    [provider]  omeprazole  (PRILOSEC) 40 MG capsule Take 1 capsule (40 mg total) by mouth daily. Patient taking differently: Take 40 mg by mouth  daily as needed (heartburn). 04/09/22   [place], [name], MD  polyethylene glycol (MIRALAX  / GLYCOLAX ) 17 g packet Take 17 g by mouth daily as needed for moderate constipation. 01/19/23   Abbe Abate, MD  rosuvastatin  (CRESTOR ) 20 MG tablet Take 1 tablet (20 mg total) by mouth daily. Please cancel all previous orders for current medication. Change in dosage or pill size. 05/04/23   Lee, [place], NP  sertraline (ZOLOFT) 50 MG tablet Take 50 mg by mouth daily. 04/13/23   [provider]  spironolactone  (ALDACTONE ) 25 MG tablet Take 1 tablet (25 mg total) by mouth daily. 05/04/23 08/02/23  Lee, [place], NP  traMADol  (ULTRAM ) 50 MG tablet Take 50 mg by mouth every 8 (eight) hours as needed. 04/05/23   [provider]  trolamine salicylate (ASPERCREME) 10 % cream Apply 1 application topically as needed for muscle pain.    [provider]  zolpidem  (AMBIEN ) 5 MG tablet Take 5 mg by mouth at bedtime as needed for sleep.    [provider]      Allergies    Tetanus toxoid adsorbed, Zestril  [lisinopril ], and Peanut (diagnostic)    Review of Systems   Review of Systems  Physical Exam Updated Vital Signs BP 113/64   Pulse 92   Temp 98.7 F (37.1 C) (  Oral)   Resp 18   Ht 5\' 7"  (1.702 m)   Wt 117.9 kg   SpO2 100%   BMI 40.72 kg/m  Physical Exam Vitals and nursing note reviewed.  HENT:     Head: Normocephalic and atraumatic.  Eyes:     Pupils: Pupils are equal, round, and reactive to light.  Cardiovascular:     Rate and Rhythm: Normal rate and regular rhythm.  Pulmonary:     Effort: Pulmonary effort is normal.     Breath sounds: Normal breath sounds.  Abdominal:     Palpations: Abdomen is soft.     Tenderness: There is no abdominal tenderness.  Musculoskeletal:     Cervical back: Neck supple. No tenderness.     Comments: Full active range of motion bilateral upper lower extremities Some tenderness to anterior palpation of the right knee without  deformity Distal pulses intact bilateral upper and lower extremities Sensation tact light touch throughout all 4 extremities No midline tenderness step-off deformity back  Skin:    General: Skin is warm and dry.  Neurological:     Mental Status: She is alert.  Psychiatric:        Mood and Affect: Mood normal.     ED Results / Procedures / Treatments   Labs (all labs ordered are listed, but only abnormal results are displayed) Labs Reviewed  CBC WITH DIFFERENTIAL/PLATELET - Abnormal; Notable for the following components:      Result Value   Platelets 58 (*)    All other components within normal limits  COMPREHENSIVE METABOLIC PANEL WITH GFR - Abnormal; Notable for the following components:   CO2 21 (*)    Glucose, Bld 111 (*)    Creatinine, Ser 1.39 (*)    GFR, Estimated 39 (*)    All other components within normal limits  I-STAT CHEM 8, ED - Abnormal; Notable for the following components:   Creatinine, Ser 1.40 (*)    Glucose, Bld 101 (*)    TCO2 19 (*)    All other components within normal limits  TROPONIN I (HIGH SENSITIVITY) - Abnormal; Notable for the following components:   Troponin I (High Sensitivity) 51 (*)    All other components within normal limits  TROPONIN I (HIGH SENSITIVITY) - Abnormal; Notable for the following components:   Troponin I (High Sensitivity) 60 (*)    All other components within normal limits  URINALYSIS, ROUTINE W REFLEX MICROSCOPIC  TYPE AND SCREEN  TROPONIN I (HIGH SENSITIVITY)    EKG EKG Interpretation Date/Time:  Wednesday June 22 2023 21:36:44 EDT Ventricular Rate:  86 PR Interval:  223 QRS Duration:  81 QT Interval:  410 QTC Calculation: 491 R Axis:   -20  Text Interpretation: Sinus rhythm Ventricular premature complex Prolonged PR interval Inferior infarct, old Abnormal lateral Q waves Probable anterior infarct, old Confirmed by Rafael Bun [phone]) on 06/22/2023 11:42:40 PM  Radiology CT Head Wo Contrast Result Date:  06/22/2023 CLINICAL DATA:  Head trauma, moderate-severe; Neck trauma (Age >= 65y) home due to fall. Patient was on toilet and fell on butt. Patient complains of left knee pain that is chronic. EMS reports initial BP was 70s systolic. After 500 mL fluids patient BP was 106/72 EXAM: CT HEAD WITHOUT CONTRAST CT CERVICAL SPINE WITHOUT CONTRAST TECHNIQUE: Multidetector CT imaging of the head and cervical spine was performed following the standard protocol without intravenous contrast. Multiplanar CT image reconstructions of the cervical spine were also generated. RADIATION DOSE REDUCTION: This exam was  performed according to the departmental dose-optimization program which includes automated exposure control, adjustment of the mA and/or kV according to patient size and/or use of iterative reconstruction technique. COMPARISON:  CT head 12/19/2019, CT chest 12/01/2015 FINDINGS: CT HEAD FINDINGS Brain: Stable prominence of the lateral ventricles may be related to central predominant atrophy, although a component of normal pressure/communicating hydrocephalus cannot be excluded. Right cerebellar and left occipital encephalomalacia. Patchy and confluent areas of decreased attenuation are noted throughout the deep and periventricular white matter of the cerebral hemispheres bilaterally, compatible with chronic microvascular ischemic disease. No evidence of large-territorial acute infarction. No parenchymal hemorrhage. No mass lesion. No extra-axial collection. No mass effect or midline shift. No hydrocephalus. Basilar cisterns are patent. Cavum septum pellucidum variant. Vascular: No hyperdense vessel. Atherosclerotic calcifications are present within the cavernous internal carotid and vertebral arteries. Skull: No acute fracture or focal lesion. Sinuses/Orbits: Paranasal sinuses and mastoid air cells are clear. The orbits are unremarkable. Other: None. CT CERVICAL SPINE FINDINGS Alignment: Reversal of normal cervical lordosis  centered at the C4-C5 level likely due to degenerative changes. Skull base and vertebrae: No acute fracture. No aggressive appearing focal osseous lesion or focal pathologic process. Soft tissues and spinal canal: No prevertebral fluid or swelling. No visible canal hematoma. Upper chest: Paraseptal and centrilobular emphysematous changes. Right lung surgical changes. Other: Atherosclerotic plaque arch and its main branches. Bilateral thyroid  glands enlarged with multiple hypodense thyroid  nodule measuring 2.7 cm on the left. IMPRESSION: 1. No acute intracranial abnormality. 2. No acute displaced fracture or traumatic listhesis of the cervical spine. 3. Stable prominence of the lateral ventricles may be related to central predominant atrophy, although a component of normal pressure/communicating hydrocephalus cannot be excluded. 4.  Aortic Atherosclerosis (ICD10-I70.0). 5. Incidental thyroid  nodule measuring 2.7 cm. Recommend non-emergent thyroid  ultrasound. Reference: J Am Coll Radiol. 2015 Feb;12(2): 143-50. Electronically Signed   By: Morgane  Naveau M.D.   On: 06/22/2023 23:40   CT Cervical Spine Wo Contrast Result Date: 06/22/2023 CLINICAL DATA:  Head trauma, moderate-severe; Neck trauma (Age >= 65y) home due to fall. Patient was on toilet and fell on butt. Patient complains of left knee pain that is chronic. EMS reports initial BP was 70s systolic. After 500 mL fluids patient BP was 106/72 EXAM: CT HEAD WITHOUT CONTRAST CT CERVICAL SPINE WITHOUT CONTRAST TECHNIQUE: Multidetector CT imaging of the head and cervical spine was performed following the standard protocol without intravenous contrast. Multiplanar CT image reconstructions of the cervical spine were also generated. RADIATION DOSE REDUCTION: This exam was performed according to the departmental dose-optimization program which includes automated exposure control, adjustment of the mA and/or kV according to patient size and/or use of iterative  reconstruction technique. COMPARISON:  CT head 12/19/2019, CT chest 12/01/2015 FINDINGS: CT HEAD FINDINGS Brain: Stable prominence of the lateral ventricles may be related to central predominant atrophy, although a component of normal pressure/communicating hydrocephalus cannot be excluded. Right cerebellar and left occipital encephalomalacia. Patchy and confluent areas of decreased attenuation are noted throughout the deep and periventricular white matter of the cerebral hemispheres bilaterally, compatible with chronic microvascular ischemic disease. No evidence of large-territorial acute infarction. No parenchymal hemorrhage. No mass lesion. No extra-axial collection. No mass effect or midline shift. No hydrocephalus. Basilar cisterns are patent. Cavum septum pellucidum variant. Vascular: No hyperdense vessel. Atherosclerotic calcifications are present within the cavernous internal carotid and vertebral arteries. Skull: No acute fracture or focal lesion. Sinuses/Orbits: Paranasal sinuses and mastoid air cells are clear. The orbits are unremarkable.  Other: None. CT CERVICAL SPINE FINDINGS Alignment: Reversal of normal cervical lordosis centered at the C4-C5 level likely due to degenerative changes. Skull base and vertebrae: No acute fracture. No aggressive appearing focal osseous lesion or focal pathologic process. Soft tissues and spinal canal: No prevertebral fluid or swelling. No visible canal hematoma. Upper chest: Paraseptal and centrilobular emphysematous changes. Right lung surgical changes. Other: Atherosclerotic plaque arch and its main branches. Bilateral thyroid  glands enlarged with multiple hypodense thyroid  nodule measuring 2.7 cm on the left. IMPRESSION: 1. No acute intracranial abnormality. 2. No acute displaced fracture or traumatic listhesis of the cervical spine. 3. Stable prominence of the lateral ventricles may be related to central predominant atrophy, although a component of normal  pressure/communicating hydrocephalus cannot be excluded. 4.  Aortic Atherosclerosis (ICD10-I70.0). 5. Incidental thyroid  nodule measuring 2.7 cm. Recommend non-emergent thyroid  ultrasound. Reference: J Am Coll Radiol. 2015 Feb;12(2): 143-50. Electronically Signed   By: Morgane  Naveau M.D.   On: 06/22/2023 23:40   DG Knee Complete 4 Views Left Result Date: 06/22/2023 CLINICAL DATA:  Status post fall. EXAM: LEFT KNEE - COMPLETE 4+ VIEW COMPARISON:  December 31, 2022 FINDINGS: No evidence of an acute fracture or dislocation. A radiopaque intramedullary rod is seen within the distal left femoral shaft. There is marked severity tricompartmental degenerative changes. Medial and lateral marginal osteophyte formation is also present. There is a small suprapatellar effusion. Marked severity vascular calcification is noted. IMPRESSION: 1. Postoperative and degenerative changes without evidence of an acute osseous abnormality. Electronically Signed   By: Virgle Grime M.D.   On: 06/22/2023 21:56   DG Chest 2 View Result Date: 06/22/2023 CLINICAL DATA:  Status post fall. EXAM: CHEST - 2 VIEW COMPARISON:  January 08, 2023 FINDINGS: Multiple sternal wires are present. The cardiac silhouette is mildly enlarged and unchanged in size. There is marked severity calcification of the thoracic aorta. An artificial aortic valve is noted. Surgical sutures are seen overlying the upper right lung. Low lung volumes are noted with mild, diffuse, chronic appearing increased interstitial lung markings. There is stable left basilar scarring and/or atelectasis. There is no evidence of focal consolidation, pleural effusion or pneumothorax. The visualized skeletal structures are unremarkable. IMPRESSION: 1. Stable cardiomegaly and postoperative changes with mild, stable left basilar scarring and/or atelectasis. Electronically Signed   By: Virgle Grime M.D.   On: 06/22/2023 21:53    Procedures Ultrasound ED FAST  Date/Time:  06/22/2023 11:34 PM  Performed by: Sallyanne Creamer, DO Authorized by: Sallyanne Creamer, DO  Procedure details:    Indications: blunt abdominal trauma and blunt chest trauma       Assess for:  Hemothorax, intra-abdominal fluid, pericardial effusion and pneumothorax    Technique:  Abdominal, cardiac and chest    Images: archived    Study Limitations: body habitus  Abdominal findings:    L kidney:  Unable to visualize   R kidney:  Visualized   Liver:  Visualized    Bladder:  Visualized, Foley catheter not visualized   Hepatorenal space visualized: identified     Splenorenal space: not identified     Rectovesical free fluid: not identified     Splenorenal free fluid: not identified     Hepatorenal space free fluid: not identified   Cardiac findings:    Heart:  Visualized   Wall motion: identified     Pericardial effusion: not identified   Chest findings:    L lung sliding: identified     R lung sliding: identified  Fluid in thorax: not identified   Comments:     Unable to obtain left kidney view, otherwise E-FAST negative     Medications Ordered in ED Medications  oxyCODONE -acetaminophen  (PERCOCET/ROXICET) 5-325 MG per tablet 1 tablet (has no administration in time range)  iohexol  (OMNIPAQUE ) 350 MG/ML injection 75 mL (75 mLs Intravenous Contrast Given 06/22/23 2232)    ED Course/ Medical Decision Making/ A&P Clinical Course as of 06/22/23 2355  Wed Jun 22, 2023  2337 Initial troponin elevated at 51.  No reported chest pain but will obtain delta troponin.  EKG shows no ischemic changes.  X-ray of the left knee and chest x-ray unremarkable.  Awaiting radiology reads of CT head C-spine chest abdomen pelvis.  Patient has remained hemodynamically stable without complaint at this time.  Awaiting radiology reads of CT head C-spine and chest abdomen pelvis [MP]  2354 CT head C-spine unremarkable.  Initial troponin of 51 with delta of 60.  Low suspicion for ACS, chest pain but will  obtain third troponin  I, Rafael Bun DO, am transitioning care of this patient to the oncoming provider pending CT chest abdomen pelvis, reevaluation and disposition [MP]    Clinical Course User Index [MP] Sallyanne Creamer, DO                                 Medical Decision Making 78 year old female with history as above including atrial fibrillation on Eliquis  presenting after mechanical fall.  Slipped on the floor hit the back of her head.  No LOC.  Now reporting some pain in the left knee acute on chronic.  Given that she is on Eliquis  will obtain CT head C-spine chest abdomen pelvis to evaluate for traumatic injury along with dedicated left knee x-ray.  Was reportedly hypotensive for EMS but improved after 500 cc IV fluid.  Remains normotensive here.  E-FAST negative with limited view of left kidney.  Will obtain trauma labs including type and screen and continue monitor hemodynamic status closely.           Final Clinical Impression(s) / ED Diagnoses Final diagnoses:  Fall in home, initial encounter  Minor head injury, initial encounter  Acute pain of left knee    Rx / DC Orders ED Discharge Orders     None         Sallyanne Creamer, DO 06/22/23 2355

## 2023-06-22 NOTE — Telephone Encounter (Signed)
 [name]  scheduled [facility] appointments. [place] needs transportation services so I reached out to our coordinator to have transportation services set up.

## 2023-06-22 NOTE — Progress Notes (Signed)
 Orthopedic Tech Progress Note Patient Details:  Danielle [name] 1945-07-05 [id number]  Patient ID: Danielle [name], female   DOB: [birth year], 78 y.o.   MRN: [id number] I attended trauma page. Danielle [name] 06/22/2023, 9:17 PM

## 2023-06-22 NOTE — Discharge Instructions (Addendum)
 You were seen in the emergency department after a fall The CAT scan of your head neck chest abdomen pelvis did not show any internal bleeding or other injuries The x-ray of your left knee looks okay Your CT scan did show a thyroid  nodule which needs further evaluation with ultrasound by your primary doctor. There is also some nonspecific fluid seen in your uterus which you should also follow-up with your doctor about. You are able to stand and walk without great difficulty Please follow-up with your primary care doctor regarding your left knee pain Return to the Emergency Department for severe headache, repeated falls or any other concerns

## 2023-06-23 ENCOUNTER — Emergency Department (HOSPITAL_COMMUNITY)

## 2023-06-23 LAB — URINALYSIS, ROUTINE W REFLEX MICROSCOPIC
Bilirubin Urine: NEGATIVE
Glucose, UA: NEGATIVE mg/dL
Hgb urine dipstick: NEGATIVE
Ketones, ur: NEGATIVE mg/dL
Leukocytes,Ua: NEGATIVE
Nitrite: NEGATIVE
Protein, ur: NEGATIVE mg/dL
Specific Gravity, Urine: 1.036 — ABNORMAL HIGH (ref 1.005–1.030)
pH: 5 (ref 5.0–8.0)

## 2023-06-23 LAB — TROPONIN I (HIGH SENSITIVITY): Troponin I (High Sensitivity): 41 ng/L — ABNORMAL HIGH (ref <18)

## 2023-06-23 NOTE — ED Notes (Signed)
 Patient discharged in stable condition.

## 2023-06-23 NOTE — ED Notes (Signed)
 Patient ambulated with assistance of walker, states no discomfort.

## 2023-06-23 NOTE — ED Provider Notes (Signed)
 Care assumed from Dr. Ranelle Buys.  Patient is a 78 year old who had a mechanical fall but she does not recall falling.  Does have dementia.  Complains of pain to her left knee and hit her head.  Traumatic imaging is pending.  CT head and C-spine negative for acute traumatic injury.  Does show incidental thyroid  nodule needs follow-up. CT chest abdomen pelvis negative for acute traumatic injury.  Does show thickening of the endometrium which is nonspecific.  Troponin minimally increased from 51 to 60.  Denies chest pain.  No EKG changes.  Ultrasound shows small amount of free fluid in the endometrial canal which was present previously.  No evidence of endometrial thickening.  Family was concerned about UTI, her urine is negative.  She is able to ambulate with minimal assistance.  Troponin minimally elevated and downtrending to 40.  Not consistent with ACS.  She denies chest pain or shortness of breath.  Family informed of incidental imaging findings and need for follow-up of the thyroid  nodule.  Patient denies any pain or discomfort.  Family at bedside is comfortable taking her home.  She is ambulatory with minimal assistance.  Traumatic workup reassuring as above. Return precautions discussed.  BP 103/60   Pulse 78   Temp 98.4 F (36.9 C) (Oral)   Resp 19   Ht 5\' 7"  (1.702 m)   Wt 117.9 kg   SpO2 100%   BMI 40.72 kg/m     Earma Gloss, MD 06/23/23 [id number]

## 2023-06-28 ENCOUNTER — Encounter

## 2023-06-28 ENCOUNTER — Inpatient Hospital Stay (HOSPITAL_BASED_OUTPATIENT_CLINIC_OR_DEPARTMENT_OTHER): Admitting: Nurse Practitioner

## 2023-06-28 ENCOUNTER — Encounter: Payer: Self-pay | Admitting: Nurse Practitioner

## 2023-06-28 ENCOUNTER — Inpatient Hospital Stay: Admitting: Licensed Clinical Social Worker

## 2023-06-28 ENCOUNTER — Telehealth: Payer: Self-pay

## 2023-06-28 ENCOUNTER — Inpatient Hospital Stay: Attending: Nurse Practitioner

## 2023-06-28 VITALS — BP 84/62 | HR 69 | Temp 97.7°F | Resp 18 | Wt 234.0 lb

## 2023-06-28 DIAGNOSIS — C50412 Malignant neoplasm of upper-outer quadrant of left female breast: Secondary | ICD-10-CM | POA: Insufficient documentation

## 2023-06-28 DIAGNOSIS — Z170 Estrogen receptor positive status [ER+]: Secondary | ICD-10-CM | POA: Diagnosis not present

## 2023-06-28 DIAGNOSIS — K219 Gastro-esophageal reflux disease without esophagitis: Secondary | ICD-10-CM | POA: Diagnosis not present

## 2023-06-28 DIAGNOSIS — Z9181 History of falling: Secondary | ICD-10-CM | POA: Diagnosis not present

## 2023-06-28 DIAGNOSIS — Z7983 Long term (current) use of bisphosphonates: Secondary | ICD-10-CM | POA: Diagnosis not present

## 2023-06-28 DIAGNOSIS — I5022 Chronic systolic (congestive) heart failure: Secondary | ICD-10-CM | POA: Insufficient documentation

## 2023-06-28 DIAGNOSIS — R9389 Abnormal findings on diagnostic imaging of other specified body structures: Secondary | ICD-10-CM | POA: Insufficient documentation

## 2023-06-28 DIAGNOSIS — Z923 Personal history of irradiation: Secondary | ICD-10-CM | POA: Diagnosis not present

## 2023-06-28 DIAGNOSIS — M1990 Unspecified osteoarthritis, unspecified site: Secondary | ICD-10-CM | POA: Insufficient documentation

## 2023-06-28 DIAGNOSIS — D696 Thrombocytopenia, unspecified: Secondary | ICD-10-CM

## 2023-06-28 DIAGNOSIS — E049 Nontoxic goiter, unspecified: Secondary | ICD-10-CM | POA: Diagnosis not present

## 2023-06-28 DIAGNOSIS — Z7901 Long term (current) use of anticoagulants: Secondary | ICD-10-CM | POA: Diagnosis not present

## 2023-06-28 DIAGNOSIS — E785 Hyperlipidemia, unspecified: Secondary | ICD-10-CM | POA: Diagnosis not present

## 2023-06-28 DIAGNOSIS — I130 Hypertensive heart and chronic kidney disease with heart failure and stage 1 through stage 4 chronic kidney disease, or unspecified chronic kidney disease: Secondary | ICD-10-CM | POA: Insufficient documentation

## 2023-06-28 DIAGNOSIS — F0394 Unspecified dementia, unspecified severity, with anxiety: Secondary | ICD-10-CM | POA: Diagnosis not present

## 2023-06-28 DIAGNOSIS — R4189 Other symptoms and signs involving cognitive functions and awareness: Secondary | ICD-10-CM

## 2023-06-28 DIAGNOSIS — I959 Hypotension, unspecified: Secondary | ICD-10-CM

## 2023-06-28 DIAGNOSIS — I2510 Atherosclerotic heart disease of native coronary artery without angina pectoris: Secondary | ICD-10-CM | POA: Diagnosis not present

## 2023-06-28 DIAGNOSIS — Z79810 Long term (current) use of selective estrogen receptor modulators (SERMs): Secondary | ICD-10-CM | POA: Diagnosis not present

## 2023-06-28 DIAGNOSIS — Z8616 Personal history of COVID-19: Secondary | ICD-10-CM | POA: Insufficient documentation

## 2023-06-28 DIAGNOSIS — M810 Age-related osteoporosis without current pathological fracture: Secondary | ICD-10-CM | POA: Insufficient documentation

## 2023-06-28 DIAGNOSIS — N183 Chronic kidney disease, stage 3 unspecified: Secondary | ICD-10-CM | POA: Diagnosis not present

## 2023-06-28 DIAGNOSIS — Z8673 Personal history of transient ischemic attack (TIA), and cerebral infarction without residual deficits: Secondary | ICD-10-CM | POA: Diagnosis not present

## 2023-06-28 DIAGNOSIS — D693 Immune thrombocytopenic purpura: Secondary | ICD-10-CM | POA: Diagnosis not present

## 2023-06-28 DIAGNOSIS — I480 Paroxysmal atrial fibrillation: Secondary | ICD-10-CM | POA: Insufficient documentation

## 2023-06-28 LAB — CMP (CANCER CENTER ONLY)
ALT: 12 U/L (ref 0–44)
AST: 16 U/L (ref 15–41)
Albumin: 3.8 g/dL (ref 3.5–5.0)
Alkaline Phosphatase: 62 U/L (ref 38–126)
Anion gap: 4 — ABNORMAL LOW (ref 5–15)
BUN: 14 mg/dL (ref 8–23)
CO2: 25 mmol/L (ref 22–32)
Calcium: 9.6 mg/dL (ref 8.9–10.3)
Chloride: 109 mmol/L (ref 98–111)
Creatinine: 1.37 mg/dL — ABNORMAL HIGH (ref 0.44–1.00)
GFR, Estimated: 40 mL/min — ABNORMAL LOW (ref >60)
Glucose, Bld: 116 mg/dL — ABNORMAL HIGH (ref 70–99)
Potassium: 4.2 mmol/L (ref 3.5–5.1)
Sodium: 138 mmol/L (ref 135–145)
Total Bilirubin: 0.6 mg/dL (ref 0.0–1.2)
Total Protein: 6.8 g/dL (ref 6.5–8.1)

## 2023-06-28 LAB — CBC WITH DIFFERENTIAL (CANCER CENTER ONLY)
Abs Immature Granulocytes: 0.01 10*3/uL (ref 0.00–0.07)
Basophils Absolute: 0 10*3/uL (ref 0.0–0.1)
Basophils Relative: 1 %
Eosinophils Absolute: 0.2 10*3/uL (ref 0.0–0.5)
Eosinophils Relative: 4 %
HCT: 36.5 % (ref 36.0–46.0)
Hemoglobin: 12.4 g/dL (ref 12.0–15.0)
Immature Granulocytes: 0 %
Lymphocytes Relative: 20 %
Lymphs Abs: 0.9 10*3/uL (ref 0.7–4.0)
MCH: 31.4 pg (ref 26.0–34.0)
MCHC: 34 g/dL (ref 30.0–36.0)
MCV: 92.4 fL (ref 80.0–100.0)
Monocytes Absolute: 0.3 10*3/uL (ref 0.1–1.0)
Monocytes Relative: 6 %
Neutro Abs: 3.2 10*3/uL (ref 1.7–7.7)
Neutrophils Relative %: 69 %
Platelet Count: 60 10*3/uL — ABNORMAL LOW (ref 150–400)
RBC: 3.95 MIL/uL (ref 3.87–5.11)
RDW: 13 % (ref 11.5–15.5)
WBC Count: 4.6 10*3/uL (ref 4.0–10.5)
nRBC: 0 % (ref 0.0–0.2)

## 2023-06-28 MED ORDER — ELTROMBOPAG OLAMINE 25 MG PO TABS: 25.0000 mg | ORAL_TABLET | Freq: Every day | ORAL | 3 refills | Status: DC

## 2023-06-28 NOTE — Progress Notes (Signed)
 [name]     Telephone:([phone]) [phone] Fax:([phone]    Patient Care Team: [place], [name], MD as PCP - General (Family Medicine) End, Veryl Gottron, MD as PCP - Cardiology (Cardiology) Danielle [place] Garage, MD as Consulting Physician (Hematology) Danielle Myers, MD as Consulting Physician (Radiation Oncology) Danielle Harry, MD as Consulting Physician (General Surgery) [name], Danielle Polio, NP as Nurse Practitioner (Hematology and Oncology) Danielle [name], [name] (Inactive) as Pharmacist (Pharmacist) Danielle Pascuzzi K, NP as Nurse Practitioner (Nurse Practitioner)   CHIEF COMPLAINT: Follow-up left breast cancer and ITP  Oncology History Overview Note  Cancer Staging Malignant neoplasm of upper-outer quadrant of left breast in female, estrogen receptor positive (HCC) Staging form: Breast, AJCC 8th Edition - Clinical stage from 12/13/2016: Stage IB (cT2, cN0, cM0, G2, ER: Positive, PR: Positive, HER2: Negative) - Signed by Danielle Quitman, MD on 12/21/2016 - Pathologic stage from 01/14/2017: Stage IA (pT2, pN0, cM0, G1, ER: Positive, PR: Positive, HER2: Negative, Oncotype DX score: 4) - Signed by Danielle Oaks, MD on 04/17/2017     Malignant neoplasm of upper-outer quadrant of left breast in female, estrogen receptor positive (HCC)  12/06/2016 Mammogram   Diagnostic Mammogram 12/06/16 IMPRESSION:  Suspicious mass in the left breast at 3 o'clock 2 cm from the nipple measuring 1.9 x 1.1 x 2.2 cm. RECOMMENDATION: Ultrasound-guided core biopsy of the mass in the 3 o'clock region of the left breast is recommended. The biopsy will be scheduled at the patient's convenience.   12/13/2016 Initial Biopsy   Diagnosis 12/13/16 Breast, left, needle core biopsy, 3:00 o'clock, 2cmfn - INVASIVE DUCTAL CARCINOMA - SEE COMMENT   12/16/2016 Initial Diagnosis   Malignant neoplasm of upper-outer quadrant of left breast in female, estrogen receptor positive (HCC)   12/17/2016 Receptors  her2   Estrogen Receptor: 100%, POSITIVE, STRONG STAINING INTENSITY Progesterone Receptor: 100%, POSITIVE, STRONG STAINING INTENSITY Proliferation Marker Ki67: 30% HER2 - NEGATIVE    01/14/2017 Surgery   Left breast lumpectomy with Dr. Delane [name]   01/14/2017 Pathology Results   Diagnosis 01/14/17 1. Breast, lumpectomy, Left - INVASIVE DUCTAL CARCINOMA, GRADE I/III, SPANNING 2.1 CM. - DUCTAL CARCINOMA IN SITU, LOW GRADE. - INVASIVE CARCINOMA IS BROADLY PRESENT AT THE INFERIOR MARGIN OF SPECIMEN 1. - DUCTAL CARCINOMA IN SITU IS FOCALLY PRESENT AT THE INFERIOR MARGIN OF SPECIMEN 1 AND BROADLY LESS THAN 0.1 CM TO THE LATERAL MARGIN OF SPECIMEN 1. - SEE ONCOLOGY TABLE BELOW. 2. Breast, excision, Additional medial margin left - DUCTAL CARCINOMA IN SITU, LOW GRADE. - DUCTAL CARCINOMA IS FOCALLY LESS THAN 0.1 CM TO THE NEW MARGIN OF SPECIMEN 2. 3. Breast, excision, Additional lateral margin left - DUCTAL CARCINOMA IN SITU, LOW GRADE. - DUCTAL CARCINOMA IN SITU IS GREATER THAN 0.2 CM TO ALL MARGINS. 4. Breast, excision, Additional superior margin left - DUCTAL CARCINOMA IN SITU, LOW GRADE. - DUCTAL CARCINOMA IN SITU IS BROADLY LESS THAN 0.1 CM TO THE NEW MARGIN OF SPECIMEN 4. 5. Lymph node, sentinel, biopsy, Left axillary - THERE IS NO EVIDENCE OF CARCINOMA IN 1 OF 1 LYMPH NODE (0/1). 6. Breast, excision, Additional inferior margin left - DUCTAL CARCINOMA IN SITU, LOW GRADE. - DUCTAL CARCINOMA IN SITU IS GREATER THAN 0.2 CM TO ALL MARGINS.    01/14/2017 Oncotype testing   Her oncotype recurrence score is 4 and her distance recurrent on Tamoxifen  alone is 3%.   03/08/2017 - 04/05/2017 Radiation Therapy   RT with Dr. Jeryl [name]    06/2017 -  Anti-estrogen oral therapy   Tamoxifen  daily   12/15/2017 Mammogram   12/15/2017 Mammogram IMPRESSION: New lumpectomy site left breast. No mammographic evidence of malignancy in either breast.      CURRENT THERAPY:  Breast cancer  surveillance Promacta  for ITP  INTERVAL HISTORY Danielle [name] returns for follow-up, overdue for 37-month follow-up, last seen by Danielle [name] 11/18/2022.  She fell at home 06/22/2023, brought to ED.  CT showed no acute brain or other abnormality or skeletal fracture anywhere but showed incidental thickened endometrium and enlarged thyroid . Pelvic US  showed small amount of fluid likely represented the area of suspected thickening on CT. Platelets down to 58K.   She presents in a wheelchair, accompanied by her mother in law. About 5 months ago, her son/pt's spouse became ill and passed away. He was the one most informed with pt's health care/meds and now her mother in law is taking over. She has some medication discrepancies, and not sure who pt's PCP is. Danielle [name] reports feeling well, eating/drinking, denies breast concerns/pain, change in bowel habits, bleeding, infection, or other specific complaints. Baseline knee and shoulder pains are stable. Pt lives alone in her own home, family help out so that she is not alone much. Eats at least 2 meals per day. MIL reports cognitive decline/progressive dementia. Thinks the fall occurred when pt tried to get up to bathroom by herself and knee gave out.   ROS  All other systems reviewed and negative   Past Medical History:  Diagnosis Date   Anticoagulated on Coumadin     managed by cardiology   Anxiety    Chronic constipation    Chronic systolic CHF (congestive heart failure) (HCC) 2016   cardiologist--- dr end and followed by CHF clinic   CKD (chronic kidney disease), stage III (HCC)    COPD (chronic obstructive pulmonary disease) (HCC)    previous had seen pulmonology --- dr Waymond Hailey, note in epic 03-14-2016, dx COPD GOLD 0   Coronary artery disease    cardiologist--- dr Richmond Chapman;  last cardiac cath 01-10-20202 mild nonobstructive disease proxLAD;  per pt, had LHC prior to AVR in [place] 2016   DOE (dyspnea on exertion)    GERD  (gastroesophageal reflux disease)    History of 2019 novel coronavirus disease (COVID-19) 12/19/2019   [name] admission w/ covid pneumonia , no intubatiion,  per pt symptoms resolved and back to baseline   History of cerebrovascular accident (CVA) with residual deficit    CVA in 2000 without residuals;  CVA post op AVR 01/ 2016 with residual right hand weakness   History of gastritis    History of pneumothorax    s/p right vats 11-19-2014 and  s/p left vats  12-13-2015   (both spontenaous due to bleb)   Hyperlipidemia    Hypertension    Hyperthyroidism    per pt followed by pcp,  dx approx 2014   Malignant neoplasm of upper-outer quadrant of left breast in female, 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   oncologist-- dr Maryalice [name]--- dx 10/ 2018  ,  s/p left lumptectomy with node dissection;  completed radiation 04-15-2017, no chemo   Multiple thyroid  nodules    in care everywhere pt had left thyroid  nodule biospy 09-18-2014 benign   Nausea, vomiting, and diarrhea 12/25/2022   NICM (nonischemic cardiomyopathy) (HCC) 2016   2016  ef 30%;  last echo in epic ef 45%   OA (osteoarthritis)    Osteoporosis    Paroxysmal atrial fibrillation [facility])    cardiologist---  dr end   Personal history of radiation therapy    left breast cancer  03-08-2017  to 04-05-2017   Thrombocytopenia [name])    followed by dr Maryalice [name]   Valvular stenosis, aortic 03/12/2014   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   Wears dentures    full upper     Past Surgical History:  Procedure Laterality Date   AORTIC VALVE REPLACEMENT  03/12/2014   [facility] in [place];  St Jude 23mm, medial Trifecta Bioprosthesis   BREAST LUMPECTOMY WITH RADIOACTIVE SEED AND SENTINEL LYMPH NODE BIOPSY Left 01/14/2017   Procedure: LEFT BREAST LUMPECTOMY WITH RADIOACTIVE SEED AND SENTINEL LYMPH NODE BIOPSY;  Surgeon: Danielle Harry, MD;  Location: [name];  Service: General;  Laterality: Left;   CARDIAC  CATHETERIZATION  02/12/2014   [facility] in [place]   HYSTEROSCOPY WITH D & C N/A 11/17/2020   Procedure: DILATATION AND CURETTAGE /HYSTEROSCOPY WITH MYOSURE;  Surgeon: Greta Leatherwood, MD;  Location: [facility] ;  Service: Gynecology;  Laterality: N/A;   INTRAMEDULLARY (IM) NAIL INTERTROCHANTERIC Left 12/10/2015   Procedure: INTRAMEDULLARY (IM) NAIL INTERTROCHANTRIC;  Surgeon: Wes Hamman, MD;  Location: [facility] OR;  Service: Orthopedics;  Laterality: Left;   OPERATIVE ULTRASOUND N/A 11/17/2020   Procedure: OPERATIVE ULTRASOUND;  Surgeon: Greta Leatherwood, MD;  Location: [name];  Service: Gynecology;  Laterality: N/A;   PLEURADESIS Left 12/03/2015   Procedure: PLEURADESIS;  Surgeon: Zelphia Higashi, MD;  Location: [facility] OR;  Service: Thoracic;  Laterality: Left;   RESECTION OF APICAL BLEB Left 12/03/2015   Procedure: BLEBECTOMY;  Surgeon: Zelphia Higashi, MD;  Location: [facility] OR;  Service: Thoracic;  Laterality: Left;   RIGHT/LEFT HEART CATH AND CORONARY ANGIOGRAPHY N/A 03/10/2020   Procedure: RIGHT/LEFT HEART CATH AND CORONARY ANGIOGRAPHY;  Surgeon: Sammy Crisp, MD;  Location: [facility] INVASIVE CV LAB;  Service: Cardiovascular;  Laterality: N/A;   VIDEO ASSISTED THORACOSCOPY Left 12/03/2015   Procedure: VIDEO ASSISTED THORACOSCOPY;  Surgeon: Zelphia Higashi, MD;  Location: [name];  Service: Thoracic;  Laterality: Left;   VIDEO ASSISTED THORACOSCOPY (VATS) W/TALC  PLEUADESIS Right 11/19/2014   in [place]     Outpatient Encounter Medications as of 06/28/2023  Medication Sig   acetaminophen  (TYLENOL ) 325 MG tablet Take 2 tablets (650 mg total) by mouth every 6 (six) hours as needed for mild pain (pain score 1-3), fever or headache.   alendronate  (FOSAMAX ) 70 MG tablet Take 1 tablet (70 mg total) by mouth every 7 (seven) days. Take with a full glass of water on an empty stomach.   apixaban  (ELIQUIS ) 5 MG TABS tablet Take 1 tablet (5 mg  total) by mouth 2 (two) times daily.   carvedilol  (COREG ) 25 MG tablet Take 1 tablet (25 mg total) by mouth 2 (two) times daily with a meal.   eltrombopag  (PROMACTA ) 25 MG tablet Take 1 tablet (25 mg total) by mouth daily. Take on an empty stomach, 1 hour before a meal or 2 hours after.   hydrOXYzine (VISTARIL) 25 MG capsule Take 25 mg by mouth daily.   losartan  (COZAAR ) 25 MG tablet Take 1 tablet (25 mg total) by mouth daily.   methimazole  (TAPAZOLE ) 5 MG tablet Take 5 mg by mouth 3 (three) times daily.   omeprazole  (PRILOSEC) 40 MG capsule Take 1 capsule (40 mg total) by mouth daily. (Patient taking differently: Take 40 mg by mouth daily as needed (heartburn).)  polyethylene glycol (MIRALAX  / GLYCOLAX ) 17 [name] packet Take 17 [name] by mouth daily as needed for moderate constipation.   rosuvastatin  (CRESTOR ) 20 MG tablet Take 1 tablet (20 mg total) by mouth daily. Please cancel all previous orders for current medication. Change in dosage or pill size.   sertraline (ZOLOFT) 50 MG tablet Take 50 mg by mouth daily.   spironolactone  (ALDACTONE ) 25 MG tablet Take 1 tablet (25 mg total) by mouth daily.   traMADol  (ULTRAM ) 50 MG tablet Take 50 mg by mouth every 8 (eight) hours as needed.   trolamine salicylate (ASPERCREME) 10 % cream Apply 1 application topically as needed for muscle pain.   zolpidem  (AMBIEN ) 5 MG tablet Take 5 mg by mouth at bedtime as needed for sleep.   No facility-administered encounter medications on file as of 06/28/2023.     Today's Vitals   06/28/23 1020 06/28/23 1021  BP: (!) 88/56 (!) 84/62  Pulse: 69   Resp: 18   Temp: 97.7 F (36.5 C)   TempSrc: Temporal   SpO2: 94%   Weight: 234 lb (106.1 kg)    Body mass index is 36.65 kg/m.   ECOG PERFORMANCE STATUS: 2-3  PHYSICAL EXAM GENERAL:alert, pleasant, no distress and comfortable SKIN: no rash  EYES: sclera clear NECK: without mass. Fullness at the thyroid  LYMPH:  no palpable cervical or supraclavicular lymphadenopathy   LUNGS: clear with normal breathing effort HEART: regular rate & rhythm, no lower extremity edema ABDOMEN: abdomen soft, non-tender and normal bowel sounds NEURO: alert & oriented x 3 with fluent speech, dementia, generalized weakness Breast exam: performed sitting in wheelchair due to mobility limitations and recent fall. No nipple discharge or inversion. S/p left lumpectomy. No palpable mass in either breast or axilla that I could appreciate   CBC    Latest Ref Rng & Units 06/28/2023    9:57 AM 06/22/2023    9:34 PM 06/22/2023    8:39 PM  CBC  WBC 4.0 - 10.5 [name]/uL 4.6   8.4   Hemoglobin 12.0 - 15.0 [name]/dL 09.8  11.9  14.7   Hematocrit 36.0 - 46.0 % 36.5  40.0  38.2   Platelets 150 - 400 [name]/uL 60   58       CMP     Latest Ref Rng & Units 06/28/2023    9:57 AM 06/22/2023    9:34 PM 06/22/2023    8:39 PM  CMP  Glucose 70 - 99 mg/dL 829  562  130   BUN 8 - 23 mg/dL 14  22  22    Creatinine 0.44 - 1.00 mg/dL 8.65  7.84  6.96   Sodium 135 - 145 mmol/L 138  139  136   Potassium 3.5 - 5.1 mmol/L 4.2  3.9  5.1   Chloride 98 - 111 mmol/L 109  110  107   CO2 22 - 32 mmol/L 25   21   Calcium  8.9 - 10.3 mg/dL 9.6   9.9   Total Protein 6.5 - 8.1 [name]/dL 6.8   6.9   Total Bilirubin 0.0 - 1.2 mg/dL 0.6   1.0   Alkaline Phos 38 - 126 U/L 62   55   AST 15 - 41 U/L 16   34   ALT 0 - 44 U/L 12   14       ASSESSMENT & PLAN: 78 yo female   Hypotension, weight loss, recent fall -Danielle [name] lost f/up since 10/2022. She presents today in a wheelchair with  generalized weakness, recent fall, hypotension, and 10 lbs weight loss.  At high risk for failure to thrive - Spouse passed away 5 months ago, patient's mother-in-law has assumed care, other family members help her at home.  Gets at least 2 meals per day.  Requires transportation and other assistance - Patient's mother-in-law would like to transfer her primary care from [name] back to [facility] so records are easily accessible.  Will  message prior PCP Dr. [place] - SBP in the 80s, denies dizziness on standing.  I reviewed her med list, instructed to hold Cozaar  and Aldactone  until further notice, and check BP at home: If SBP <100, hold BP meds - Danielle [name] last week at home, reportedly tried to ambulate to bathroom without assistance and her knee gave out.  Reviewed the importance of avoiding falls with thrombocytopenia and on Eliquis  - Patient seen by our social worker Amalia Badder at her today who will assist, I placed an urgent referral to home health for nursing, PT, and OT  Malignant neoplasm of upper-outer quadrant of left breast, Stage IB, c(T2,N0,M0 ), ER/PR: POSITIVE, HER2: NEGATIVE, Grade II. Oncotype 4 -s/p lumpectomy 12/2016 and adjuvant radiation 03/2017 - 04/2017, began tamoxifen  06/2017 and tolerating well.  -Last mammogram 01/18/2022 was negative -Due to abnormal vaginal bleeding, discontinue tamoxifen  and surveillance  -Danielle [name] is clinically doing well from a breast cancer standpoint. Exam is benign, labs are stable. Overall no clinical concern for recurrence -She is no longer doing routine mammograms due to her other medical co-morbidities. Will follow clinically    Mild thrombocytopenia, likely ITP -She has had a mild thrombocytopenia since at least 10/2015, lowest platelet 66K -CT abdomen 07/2018 showed cysts in the liver.   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 Will refill -Lab in 1 and 4 months, f/up in 4 months    Osteoporosis and back pain -10/2018 DEXA shows osteoporosis with lowest T score -2.8, on Fosamax  -Proceed with repeat DEXA to reevaluate and determine if she is a candidate for AI.  Consider changing Fosamax  to Zometa infusions   CAD, Afib, CHF, Aortic Stenosis, H/o Stroke, HTN, dementia  -Per PCP, cardiology    PLAN: -Recent ED course, imaging, and today's labs  reviewed -Refill Promacta  for thrombocytopenia and high risk for fall/bleeding -Seen by SW today, urgent referral to home health for Nursing, PT, OT -Message to care team re: complex health management and close f/up -Lab in 1 and 4 months with f/up in 4 months, or sooner if needed  Orders Placed This Encounter  Procedures   Ambulatory referral to Home Health    Referral Priority:   Urgent    Referral Type:   Home Health Care    Referral Reason:   Specialty Services Required    Requested Specialty:   Home Health Services    Number of Visits Requested:   1      All questions were answered. The patient knows to call the clinic with any problems, questions or concerns. No barriers to learning were detected. I spent 30 minutes counseling the patient face to face. The total time spent in the appointment was 40 minutes and more than 50% was on counseling, review of test results, and coordination of care.   Wess Baney [name] Akima Slaugh, NP 06/28/2023

## 2023-06-28 NOTE — Progress Notes (Signed)
 Spoke with Danielle [name] , they will call back to schedule follow up appointment with PCP.

## 2023-06-28 NOTE — Progress Notes (Signed)
 [facility] CSW Progress Note  Visual merchandiser met with patient and mother-in-law following doctor's visit.  Per mother-in-law pt's spouse passed away about 5 months ago and had been pt's primary caretaker.  The family was not aware of how much care pt now requires and has been struggling to familiarize themselves w/ her medications and doctors.  Pt reportedly has some memory issues in addition to a recent fall which resulted in a trip to the ED.  Pt resides alone at this time.  Per mother-in-law pt owns her home and does not qualify for Medicaid (pt has family planning Medicaid only).  Pt has not been able to keep up w/ the bills at the house and the family is not in a position to financially support her.  It appears pt would benefit greatly from 24 hr supervision.  ALF was discussed as well as Pensions consultant an Engineer, production.  Pt does not wish to leave her home and is not open to hiring an aide.  A referral for home care was placed today to assess inside the home w/ social work being added to the referral.  Pt's mother-in-law also given contact information for [facility] to discuss options to provide the level of care pt appears to need.      Quenton Bruns, LCSW Clinical Social Worker [facility]

## 2023-06-28 NOTE — Telephone Encounter (Signed)
 Received CC chart from oncology today to see about getting pt scheduled for follow up with PCP. Placed call to [name] , pt's mother in law, about scheduling an appointment. They would like to schedule one, but she needs to discuss with pt first and see when they can come. They will call the office back to schedule the appointment.

## 2023-06-29 ENCOUNTER — Other Ambulatory Visit (HOSPITAL_COMMUNITY): Payer: Self-pay

## 2023-06-29 ENCOUNTER — Telehealth: Payer: Self-pay | Admitting: Hematology

## 2023-06-30 ENCOUNTER — Telehealth: Payer: Self-pay | Admitting: Pharmacy Technician

## 2023-06-30 ENCOUNTER — Other Ambulatory Visit (HOSPITAL_COMMUNITY): Payer: Self-pay

## 2023-06-30 NOTE — Telephone Encounter (Signed)
 Oral Oncology Patient Advocate Encounter  Reached out and spoke with patient regarding PAP paperwork, explained that I would send it to their preferred email via Docu[employer].   Confirmed email address: [email or web address].    Patient expressed understanding and consent.  Will follow up once paperwork has been signed and returned.   Hansel Ley, CPhT Pharmacy Technician Coordinator [facility] [phone] (Ph) 06/30/2023 12:23 PM

## 2023-06-30 NOTE — Telephone Encounter (Signed)
 Oral Oncology Patient Advocate Encounter  After completing a benefits investigation, prior authorization for Promacta  is not required at this time through [place].  Patient's copay is $1,053.29.     Roda Cirri, CPhT Specialty Pharmacy Patient Advocate Phone: [phone] Fax: [phone]

## 2023-06-30 NOTE — Telephone Encounter (Signed)
 Oral Oncology Patient Advocate Encounter   Lovena Rubinstein began application for assistance for Promacta  through [employer]. I spoke with [name]  about the form. She will sign via docusign, once she receives it and will try to upload the social security benefits letter via [employer].    Application will be submitted upon completion of necessary supporting documentation.   [employer] phone number [phone], Fax [phone]   I will continue to check the status until final determination.   Roda Cirri, CPhT Specialty Pharmacy Patient Advocate Phone: [phone] Fax: [phone]

## 2023-06-30 NOTE — Telephone Encounter (Signed)
 Oral Oncology Patient Advocate Encounter   Began application for assistance for Promacta  through [employer] Patient [employer].   Application will be submitted upon completion of necessary supporting documentation.   [employer]'s phone number [phone].   I will continue to check the status until final determination.    Hansel Ley, CPhT Pharmacy Technician Coordinator [facility] [phone] (Ph) 06/30/2023 12:22 PM

## 2023-07-04 NOTE — Telephone Encounter (Signed)
 Oral Oncology Patient Advocate Encounter  Received pt's social Security Benefits letter via email. & Danielle [name] received the forms signed via docusign.  Attached both with the physician's portion and faxed to the [employer] program   Completed application signed by patient and provider has been faxed to [phone] on 07/04/23.    [employer] Patient Assistance [employer] phone number for follow up is [phone].    This encounter will be updated until final determination.   Roda Cirri, CPhT Specialty Pharmacy Patient Advocate Phone: [phone] Fax: [phone]

## 2023-07-07 NOTE — Telephone Encounter (Signed)
 Per message below, spoke with [name]  to find out Danielle [name] had been denied the extra help through Medicare, but she didn't have a letter. She will call Medicare to get the letter sent to her. Once we have that I will call to let them know that she did not file taxes this year, as they are also asking for her 1040.

## 2023-07-11 ENCOUNTER — Telehealth (HOSPITAL_COMMUNITY): Payer: Self-pay | Admitting: Licensed Clinical Social Worker

## 2023-07-11 ENCOUNTER — Inpatient Hospital Stay (HOSPITAL_COMMUNITY): Admission: RE | Admit: 2023-07-11 | Source: Ambulatory Visit | Admitting: Cardiology

## 2023-07-11 ENCOUNTER — Telehealth (HOSPITAL_COMMUNITY): Payer: Self-pay | Admitting: Cardiology

## 2023-07-11 NOTE — Telephone Encounter (Signed)
 CSW received call form patient's mother in law stating that patient will need a ride to today's appointment. Patient requires wheelchair transport and unfortunately with only 2 hours notice of transport need the appointment will need to be rescheduled. Patient's new appointment is now 07-20-23 at 2pm. CSW will contact [employer] to arrange. Aubry Blase, LCSW, CCSW-MCS [phone]

## 2023-07-11 NOTE — Telephone Encounter (Signed)
 Pt need transportation assist, she just told me she made need a lift or SUV, I told her a lift was not available, her insurance doesn't provide transport. I told her you will call her back

## 2023-07-12 NOTE — Telephone Encounter (Signed)
 Oral Oncology Patient Advocate Encounter  Called to follow up on the denial letter for the Medicare Extra Help. [name]  said it was Medicaid that she got a call about that the pt was denied for, so she will apply for the Medicare Extra Help to see if she qualifies for it. I emailed her the link to apply online, since she was driving at the time.   Roda Cirri, CPhT Specialty Pharmacy Patient Advocate Phone: [phone] Fax: [phone]

## 2023-07-13 ENCOUNTER — Telehealth (HOSPITAL_COMMUNITY): Payer: Self-pay | Admitting: Licensed Clinical Social Worker

## 2023-07-13 NOTE — Telephone Encounter (Signed)
 H&V Care Navigation CSW Progress Note  Clinical Social Worker contacted caregiver by phone to assist with transport needs.  Patient is participating in a Managed Medicaid Plan:  No  Patient's caregiver states she will need wheelchair transport and they have no other way to get patient to appointments. CSW contacted [facility] [employer] and made arrangements for appointment on May 21,2025 at 2pm. Caregiver informed of arrangements and waiver reviewed. Aubry Blase, LCSW, CCSW-MCS [phone]   SDOH Screenings   Food Insecurity: No Food Insecurity (02/04/2023)  Housing: Low Risk  (02/04/2023)  Transportation Needs: Unmet Transportation Needs (07/13/2023)  Utilities: Not At Risk (12/25/2022)  Alcohol Screen: Low Risk  (02/04/2023)  Depression (PHQ2-9): Low Risk  (02/04/2023)  Financial Resource Strain: Low Risk  (02/04/2023)  Physical Activity: Insufficiently Active (02/04/2023)  Social Connections: Socially Integrated (02/04/2023)  Stress: No Stress Concern Present (02/04/2023)  Tobacco Use: Medium Risk (06/28/2023)  Health Literacy: Adequate Health Literacy (02/04/2023)   07/13/2023  Danielle [name] DOB: February 18, 1946 MRN: [id number]   RIDER WAIVER AND RELEASE OF LIABILITY  For the purposes of helping with transportation needs, [facility] partners with outside transportation providers (taxi companies, [place], Catering manager.) to give [place] patients or other approved people the choice of on-demand rides [employer]") to our buildings for non-emergency visits.  By using [employer], I, the person signing this document, on behalf of myself and/or any legal minors (in my care using the [employer]), agree:  Science writer given to me are supplied by independent, outside transportation providers who do not work for, or have any affiliation with, [employer]. [place] is not a transportation company. [facility] has no control over the quality or safety of the rides I  get using [employer]. [place] has no control over whether any outside ride will happen on time or not. [facility] gives no guarantee on the reliability, quality, safety, or availability on any rides, or that no mistakes will happen. I know and accept that traveling by vehicle (car, truck, SVU, [place], bus, taxi, etc.) has risks of serious injuries such as disability, being paralyzed, and death. I know and agree the risk of using [employer] is mine alone, and not [employer]. Transport Services are provided "as is" and as are available. The transportation providers are in charge for all inspections and care of the vehicles used to provide these rides. I agree not to take legal action against [place], its agents, employees, officers, directors, representatives, insurers, attorneys, assigns, successors, subsidiaries, and affiliates at any time for any reasons related directly or indirectly to using [employer]. I also agree not to take legal action against [facility] or its affiliates for any injury, death, or damage to property caused by or related to using [employer]. I have read this Waiver and Release of Liability, and I understand the terms used in it and their legal meaning. This Waiver is freely and voluntarily given with the understanding that my right (or any legal minors) to legal action against [place] [facility] relating to [employer] is knowingly given up to use these services.   I attest that I read the Ride Waiver and Release of Liability to Danielle [name], gave Ms. [name] the opportunity to ask questions and answered the questions asked (if any). I affirm that Danielle [name] then provided consent for assistance with transportation.     Nobuko Gsell S Pualani Borah

## 2023-07-14 ENCOUNTER — Other Ambulatory Visit (HOSPITAL_COMMUNITY): Payer: Self-pay

## 2023-07-14 ENCOUNTER — Telehealth: Payer: Self-pay

## 2023-07-14 NOTE — Telephone Encounter (Signed)
 Oral Oncology Patient Advocate Encounter  New authorization   Received notification that prior authorization for Promacta  is required.   PA submitted on 07/14/23  Key [id number]  Status is pending     Hansel Ley, CPhT Pharmacy Technician Coordinator [facility] [phone] (Ph) 07/14/2023 12:02 PM

## 2023-07-18 NOTE — Telephone Encounter (Signed)
 Oral Oncology Pharmacist Encounter   Prior Authorization for Promacta  has been denied.     Urgent/Expedited appeal letter sent to [employer] Prior Authorization Guidelines. Appeal faxed to: [phone].    Oral Oncology Clinic will continue to follow.   Jude Norton, PharmD, BCPS, BCOP Hematology/Oncology Clinical Pharmacist [facility] and [facility] Oral Chemotherapy Navigation Clinics [phone] 07/18/2023 3:47 PM

## 2023-07-19 ENCOUNTER — Telehealth (HOSPITAL_COMMUNITY): Payer: Self-pay

## 2023-07-19 NOTE — Telephone Encounter (Signed)
 Called to confirm/remind patient of their appointment at the [facility] on 07/20/2023 2:00.   Appointment:   [x] Confirmed  [] Left mess   [] No answer/No voice mail  [] VM Full/unable to leave message  [] Phone not in service  Patient reminded to bring all medications and/or complete list.  Confirmed patient has transportation. Gave directions, instructed to utilize valet parking.

## 2023-07-19 NOTE — Telephone Encounter (Signed)
 Oral Oncology Pharmacist Encounter   Received notification from [employer] that additional information is needed for expedited Promacta  appeal.    Form filled out and faxed back to [phone] for review.   Oral Oncology Clinic will continue to follow.   Jude Norton, PharmD, BCPS, BCOP Hematology/Oncology Clinical Pharmacist [facility] and [facility] Oral Chemotherapy Navigation Clinics [phone] 07/19/2023 10:13 AM

## 2023-07-20 ENCOUNTER — Ambulatory Visit (HOSPITAL_COMMUNITY)
Admission: RE | Admit: 2023-07-20 | Discharge: 2023-07-20 | Disposition: A | Discharge status: Home / Self Care | Source: Ambulatory Visit | Attending: Adult Health | Admitting: Adult Health

## 2023-07-20 ENCOUNTER — Encounter (HOSPITAL_COMMUNITY): Payer: Self-pay

## 2023-07-20 VITALS — BP 120/65 | HR 67 | Ht 67.0 in | Wt 229.0 lb

## 2023-07-20 DIAGNOSIS — E785 Hyperlipidemia, unspecified: Secondary | ICD-10-CM | POA: Insufficient documentation

## 2023-07-20 DIAGNOSIS — Z79899 Other long term (current) drug therapy: Secondary | ICD-10-CM | POA: Diagnosis not present

## 2023-07-20 DIAGNOSIS — I2510 Atherosclerotic heart disease of native coronary artery without angina pectoris: Secondary | ICD-10-CM | POA: Diagnosis not present

## 2023-07-20 DIAGNOSIS — Z853 Personal history of malignant neoplasm of breast: Secondary | ICD-10-CM | POA: Diagnosis not present

## 2023-07-20 DIAGNOSIS — Z5982 Transportation insecurity: Secondary | ICD-10-CM | POA: Insufficient documentation

## 2023-07-20 DIAGNOSIS — I130 Hypertensive heart and chronic kidney disease with heart failure and stage 1 through stage 4 chronic kidney disease, or unspecified chronic kidney disease: Secondary | ICD-10-CM | POA: Diagnosis not present

## 2023-07-20 DIAGNOSIS — Z87891 Personal history of nicotine dependence: Secondary | ICD-10-CM | POA: Diagnosis not present

## 2023-07-20 DIAGNOSIS — Z8673 Personal history of transient ischemic attack (TIA), and cerebral infarction without residual deficits: Secondary | ICD-10-CM | POA: Diagnosis not present

## 2023-07-20 DIAGNOSIS — Z7901 Long term (current) use of anticoagulants: Secondary | ICD-10-CM | POA: Insufficient documentation

## 2023-07-20 DIAGNOSIS — Z923 Personal history of irradiation: Secondary | ICD-10-CM | POA: Diagnosis not present

## 2023-07-20 DIAGNOSIS — I480 Paroxysmal atrial fibrillation: Secondary | ICD-10-CM | POA: Diagnosis not present

## 2023-07-20 DIAGNOSIS — N1832 Chronic kidney disease, stage 3b: Secondary | ICD-10-CM | POA: Diagnosis not present

## 2023-07-20 DIAGNOSIS — I5022 Chronic systolic (congestive) heart failure: Secondary | ICD-10-CM | POA: Diagnosis present

## 2023-07-20 DIAGNOSIS — I10 Essential (primary) hypertension: Secondary | ICD-10-CM | POA: Diagnosis not present

## 2023-07-20 DIAGNOSIS — Z953 Presence of xenogenic heart valve: Secondary | ICD-10-CM | POA: Insufficient documentation

## 2023-07-20 DIAGNOSIS — I428 Other cardiomyopathies: Secondary | ICD-10-CM | POA: Insufficient documentation

## 2023-07-20 NOTE — Progress Notes (Signed)
 ADVANCED HF CLINIC NOTE   PCP: [place], [name], MD Primary Cardiolog: Dr. Nolan Battle HF Cardiology: Dr. Mitzie Anda  Chief Complaint: Heart Failure Follow-up HPI: 79 y.o. 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  Echo in 9/21, EF 45-50%. LHC in 1/22 showed mild nonobstructive CAD.    Cardiac MRI (10/22) with LV EF 41%, normal RV, subendocardial LGE in the mid-apical inferior wall and the mid-apical inferolateral wall.   Echo 12/22, EF around 35%, RV poorly visualized, the bioprosthetic aortic valve looked ok.    Echo 11/23, EF up to 50% with mild LVH, RV mildly enlarged with normal systolic function, bioprosthetic aortic valve functioning normally.   Admitted in 10/24 for acute hypoxia in the setting of pneumonia, PE (by V/Q scan and + DVTS), and COVID +. Echo at the time with very poor window, EF thought mildly reduced, RV appeared mod reduced. Started on Eliquis .  Today she returns for HF follow up with her mother in law. She has been living alone since her husband passed away in April 26, 2023. 3 family members are checking on her. Overall feeling fine. Limited by joint pain. Denies SOB/PND/Orthopnea. She needs help getting dressed. Appetite ok.She has a hard time paying for food. No fever or chills. Taking all medications. Followed by [facility].  She tells me her medications are in a pack.  Needs help with transportation.   Social History   Socioeconomic History   Marital status: Married    Spouse name: Sherwood   Number of children: 0   Years of education: 12   Highest education level: Bachelor's degree (e.g., BA, AB, BS)  Occupational History   Occupation: Retired in 2004  Tobacco Use   Smoking status: Former    Current packs/day: 0.00    Average packs/day: 0.3 packs/day for 10.0 years (2.5 ttl pk-yrs)     Types: Cigarettes    Start date: 2002    Quit date: 2012    Years since quitting: 13.3   Smokeless tobacco: Never  Vaping Use   Vaping status: Never Used  Substance and Sexual Activity   Alcohol use: No   Drug use: Never   Sexual activity: Yes    Birth control/protection: Post-menopausal  Other Topics Concern   Not on file  Social History Narrative   Lives with husband.  Ambulated independently.   Right-handed.   No daily caffeine use.   Social Drivers of Corporate investment banker Strain: Low Risk  (02/04/2023)   Overall Financial Resource Strain (CARDIA)    Difficulty of Paying Living Expenses: Not hard at all  Food Insecurity: No Food Insecurity (02/04/2023)   Hunger Vital Sign    Worried About Running Out of Food in the Last Year: Never true    Ran Out of Food in the Last Year: Never true  Transportation Needs: Unmet Transportation Needs (07/13/2023)   PRAPARE - Administrator, Civil Service (Medical): Yes    Lack of Transportation (Non-Medical): Yes  Physical Activity: Insufficiently Active (02/04/2023)   Exercise Vital Sign    Days of Exercise per Week: 2 days    Minutes of Exercise per Session: 20 min  Stress: No Stress Concern Present (02/04/2023)   [name]    Feeling of Stress : Not at all  Social Connections: Socially  Integrated (02/04/2023)   Social Connection and Isolation Panel [NHANES]    Frequency of Communication with Friends and Family: More than three times a week    Frequency of Social Gatherings with Friends and Family: More than three times a week    Attends Religious Services: More than 4 times per year    Active Member of [name]: Yes    Attends Engineer, structural: More than 4 times per year    Marital Status: Married  Catering manager Violence: Not At Risk (02/04/2023)   Humiliation, Afraid, Rape, and Kick questionnaire    Fear of Current or  Ex-Partner: No    Emotionally Abused: No    Physically Abused: No    Sexually Abused: No   Family History  Problem Relation Age of Onset   Diabetes Mother    Heart attack Mother 98   Diabetes Father    Lung cancer Father    Diabetes Sister    Thyroid  disease Sister    Diabetes Sister    HIV Brother    Colon polyps Neg Hx    Colon cancer Neg Hx    ROS: All systems reviewed and negative except as per HPI.   Current Outpatient Medications  Medication Sig Dispense Refill   acetaminophen  (TYLENOL ) 325 MG tablet Take 2 tablets (650 mg total) by mouth every 6 (six) hours as needed for mild pain (pain score 1-3), fever or headache.     alendronate  (FOSAMAX ) 70 MG tablet Take 1 tablet (70 mg total) by mouth every 7 (seven) days. Take with a full glass of water on an empty stomach. 12 tablet 0   apixaban  (ELIQUIS ) 5 MG TABS tablet Take 1 tablet (5 mg total) by mouth 2 (two) times daily. 180 tablet 1   carvedilol  (COREG ) 25 MG tablet Take 1 tablet (25 mg total) by mouth 2 (two) times daily with a meal. 180 tablet 3   eltrombopag  (PROMACTA ) 25 MG tablet Take 1 tablet (25 mg total) by mouth daily. Take on an empty stomach, 1 hour before a meal or 2 hours after. 30 tablet 3   hydrOXYzine (VISTARIL) 25 MG capsule Take 25 mg by mouth daily.     losartan  (COZAAR ) 25 MG tablet Take 1 tablet (25 mg total) by mouth daily. 30 tablet 2   methimazole  (TAPAZOLE ) 5 MG tablet Take 5 mg by mouth 3 (three) times daily.     omeprazole  (PRILOSEC) 40 MG capsule Take 1 capsule (40 mg total) by mouth daily. (Patient taking differently: Take 40 mg by mouth daily as needed (heartburn).) 60 capsule 0   polyethylene glycol (MIRALAX  / GLYCOLAX ) 17 g packet Take 17 g by mouth daily as needed for moderate constipation.     rosuvastatin  (CRESTOR ) 20 MG tablet Take 1 tablet (20 mg total) by mouth daily. Please cancel all previous orders for current medication. Change in dosage or pill size. 90 tablet 3   sertraline (ZOLOFT)  50 MG tablet Take 50 mg by mouth daily.     spironolactone  (ALDACTONE ) 25 MG tablet Take 1 tablet (25 mg total) by mouth daily. 90 tablet 3   traMADol  (ULTRAM ) 50 MG tablet Take 50 mg by mouth every 8 (eight) hours as needed.     trolamine salicylate (ASPERCREME) 10 % cream Apply 1 application topically as needed for muscle pain.     zolpidem  (AMBIEN ) 5 MG tablet Take 5 mg by mouth at bedtime as needed for sleep.     No  current facility-administered medications for this encounter.   Wt Readings from Last 3 Encounters:  07/20/23 103.9 kg (229 lb)  06/28/23 106.1 kg (234 lb)  06/22/23 117.9 kg (260 lb)   BP 120/65   Pulse 67   Ht 5\' 7"  (1.702 m)   Wt 103.9 kg (229 lb)   SpO2 96%   BMI 35.87 kg/m   Physical Exam: General:   No resp difficulty. Arrived in wheel chair.  Neck: supple. no JVD.  Cor: PMI nondisplaced. Regular rate & rhythm. No rubs, gallops or murmurs. Lungs: clear Abdomen: soft, nontender, nondistended.  Extremities: no cyanosis, clubbing, rash, edema Neuro: alert & oriented x3  Assessment/Plan: 1. Chronic HF with mid range EF: NICM. Echo in 9/21 showed EF 45-50%, global hypokinesis, mild LVH, bioprosthetic aortic valve mean gradient 10 mmHg, normal RV.  Cause of cardiomyopathy is uncertain, mild CAD on 1/22 cath.  It is possible that this is chronic effect of long-standing AI that was not improved with valve replacement.  Also consider long-standing HTN. CMRI in 10/22 showed LV EF 41%, normal RV, subendocardial LGE in the mid-apical inferior wall and the mid-apical inferolateral wall. The LGE pattern does not look like cardiac amyloidosis.  The subendocardial LGE looks like it came from prior MI but minimal CAD on cath => ?prior embolism down a coronary related to atrial fibrillation. Echo 11/23 EF up to 50% with mild LVH, RV mildly enlarged with normal systolic function, bioprosthetic aortic valve functioning normally. Echo 10/24 40-45%, mod reduced RV. - NYHA II.  Appears  euvolemic.  -Hold off on SGLT2 due to frequent urinary incontinence.  - Continue Coreg  25 mg bid.   - Continue Losartan  25 mg daily - Continue Spironolactone  25 mg daily.   2. Aortic valve disorders: Per report, had severe AI.  Now s/p bioprosthetic aortic valve replacement.  The valve functions normally on 10/24 echo.   3. PE  - noted by V/Q during 10/24 admission - possibly in the setting of hypercoagulable state (COVID+) and PAF - on eliquis  5 mg bid  4. HTN: Stable.  Continue current regimen.   5. Atrial fibrillation: Paroxysmal.  - ECG with NSR today - Continue Eliquis , no bleeding  6. CKD 3b: Baseline Cr 1.3-1.5.  7. Obesity: Body mass index is 35.87 kg/m. - Refer to PharmD Clinic for GLP1  8. HLD - restart Crestor  20 mg daily - check Lipids today  Follow up in 3-4 months with Dr Mitzie Anda. Concerned she may need placement or home health.  She does have family members checking in on her. I will place SW referral.   Danielle Bars, NP 07/20/2023

## 2023-07-20 NOTE — Patient Instructions (Signed)
 NO CHANGES TODAY- IT WAS GREAT TO SEE YOU!   Follow-Up in: 3 MONTHS WITH DR. Mitzie Anda PLEASE CALL OUR OFFICE AROUND JUNE TO GET SCHEDULED FOR YOUR APPOINTMENT. PHONE NUMBER IS [phone] OPTION 2   At the [facility], you and your health needs are our priority. We have a designated team specialized in the treatment of Heart Failure. This Care Team includes your primary Heart Failure Specialized Cardiologist (physician), Advanced Practice Providers (APPs- Physician Assistants and Nurse Practitioners), and Pharmacist who all work together to provide you with the care you need, when you need it.   You may see any of the following providers on your designated Care Team at your next follow up:  Dr. Jules Oar Dr. Peder Bourdon Dr. Alwin Baars Dr. Judyth Nunnery Nieves Bars, NP Ruddy Corral, [place] [facility] [place], [place] Dennise Fitz, NP [place] Lee, NP Luster Salters, PharmD   Please be sure to bring in all your medications bottles to every appointment.   Need to Contact Us :  If you have any questions or concerns before your next appointment please send us  a message through [place] or call our office at [phone].    TO LEAVE A MESSAGE FOR THE NURSE SELECT OPTION 2, PLEASE LEAVE A MESSAGE INCLUDING: YOUR NAME DATE OF BIRTH CALL BACK NUMBER REASON FOR CALL**this is important as we prioritize the call backs  YOU WILL RECEIVE A CALL BACK THE SAME DAY AS LONG AS YOU CALL BEFORE 4:00 PM

## 2023-07-26 NOTE — Telephone Encounter (Signed)
 Oral Oncology Patient Advocate Encounter  Spoke with [name] again about applying for the Medicare Extra Help. She said that she and her granddaughter tried to send us  the document saying that when they tried to apply for the Extra help it's telling them they can't apply with the SS # is not able to be used, but it is the correct ss # for the pt. I don't have an email from either of them. She figured out that some how they were trying to text it to the phone #, which won't send anything to us . She is going to call back once she gets her granddaughter to help her again, so they can send it to the email address.   Roda Cirri, CPhT Specialty Pharmacy Patient Advocate Phone: [phone] Fax: [phone]

## 2023-07-28 ENCOUNTER — Inpatient Hospital Stay: Attending: Nurse Practitioner

## 2023-07-28 DIAGNOSIS — D696 Thrombocytopenia, unspecified: Secondary | ICD-10-CM | POA: Diagnosis not present

## 2023-07-28 DIAGNOSIS — Z79810 Long term (current) use of selective estrogen receptor modulators (SERMs): Secondary | ICD-10-CM | POA: Insufficient documentation

## 2023-07-28 DIAGNOSIS — C50412 Malignant neoplasm of upper-outer quadrant of left female breast: Secondary | ICD-10-CM | POA: Insufficient documentation

## 2023-07-28 DIAGNOSIS — Z170 Estrogen receptor positive status [ER+]: Secondary | ICD-10-CM | POA: Diagnosis not present

## 2023-07-28 DIAGNOSIS — Z1721 Progesterone receptor positive status: Secondary | ICD-10-CM | POA: Diagnosis not present

## 2023-07-28 DIAGNOSIS — Z1732 Human epidermal growth factor receptor 2 negative status: Secondary | ICD-10-CM | POA: Insufficient documentation

## 2023-07-28 LAB — CBC WITH DIFFERENTIAL (CANCER CENTER ONLY)
Abs Immature Granulocytes: 0.01 10*3/uL (ref 0.00–0.07)
Basophils Absolute: 0 10*3/uL (ref 0.0–0.1)
Basophils Relative: 1 %
Eosinophils Absolute: 0.4 10*3/uL (ref 0.0–0.5)
Eosinophils Relative: 8 %
HCT: 41.6 % (ref 36.0–46.0)
Hemoglobin: 13.9 g/dL (ref 12.0–15.0)
Immature Granulocytes: 0 %
Lymphocytes Relative: 23 %
Lymphs Abs: 1.3 10*3/uL (ref 0.7–4.0)
MCH: 31.6 pg (ref 26.0–34.0)
MCHC: 33.4 g/dL (ref 30.0–36.0)
MCV: 94.5 fL (ref 80.0–100.0)
Monocytes Absolute: 0.5 10*3/uL (ref 0.1–1.0)
Monocytes Relative: 9 %
Neutro Abs: 3.3 10*3/uL (ref 1.7–7.7)
Neutrophils Relative %: 59 %
Platelet Count: 60 10*3/uL — ABNORMAL LOW (ref 150–400)
RBC: 4.4 MIL/uL (ref 3.87–5.11)
RDW: 13.2 % (ref 11.5–15.5)
WBC Count: 5.6 10*3/uL (ref 4.0–10.5)
nRBC: 0 % (ref 0.0–0.2)

## 2023-07-28 LAB — CMP (CANCER CENTER ONLY)
ALT: 11 U/L (ref 0–44)
AST: 19 U/L (ref 15–41)
Albumin: 4.2 g/dL (ref 3.5–5.0)
Alkaline Phosphatase: 75 U/L (ref 38–126)
Anion gap: 5 (ref 5–15)
BUN: 20 mg/dL (ref 8–23)
CO2: 27 mmol/L (ref 22–32)
Calcium: 10.3 mg/dL (ref 8.9–10.3)
Chloride: 106 mmol/L (ref 98–111)
Creatinine: 1.3 mg/dL — ABNORMAL HIGH (ref 0.44–1.00)
GFR, Estimated: 42 mL/min — ABNORMAL LOW (ref >60)
Glucose, Bld: 78 mg/dL (ref 70–99)
Potassium: 4.5 mmol/L (ref 3.5–5.1)
Sodium: 138 mmol/L (ref 135–145)
Total Bilirubin: 0.6 mg/dL (ref 0.0–1.2)
Total Protein: 7.6 g/dL (ref 6.5–8.1)

## 2023-07-28 NOTE — Telephone Encounter (Signed)
 Oral Oncology Patient Advocate Encounter  Spoke with [name] again and was able to help her fill out the Extra Help Application over the phone. She will contact us  once she's heard a determination.  Roda Cirri, CPhT Specialty Pharmacy Patient Advocate Phone: [phone] Fax: [phone]

## 2023-07-29 ENCOUNTER — Telehealth: Payer: Self-pay

## 2023-07-29 NOTE — Telephone Encounter (Signed)
 Copied from CRM [id number]. Topic: General - Other >> Jul 29, 2023  9:33 AM Emylou G wrote: Reason for CRM: Arnetta Lank brother called.. returning our call.[name] He wants to know why we called and to clarify what the June 3 appt is for Also can he get inserts, pullups and wipes.[name]

## 2023-08-01 NOTE — Assessment & Plan Note (Signed)
Stage IB, pT2N0M0, ER+/PR+, HER2-, Grade II -diagnosed in 11/2016. S/p left lumpectomy and adjuvant radiation.  -Oncotype Dx showed low risk -She started Tamoxifen in 06/2017, discontinued 09/2020 due to postmenopausal bleeding. She is now on surveillance. -Continue annual mammogram

## 2023-08-02 ENCOUNTER — Telehealth: Payer: Self-pay | Admitting: Hematology

## 2023-08-02 ENCOUNTER — Other Ambulatory Visit (HOSPITAL_COMMUNITY): Payer: Self-pay

## 2023-08-02 ENCOUNTER — Inpatient Hospital Stay: Attending: Nurse Practitioner | Admitting: Hematology

## 2023-08-02 ENCOUNTER — Encounter: Payer: Self-pay | Admitting: Hematology

## 2023-08-02 DIAGNOSIS — C50412 Malignant neoplasm of upper-outer quadrant of left female breast: Secondary | ICD-10-CM

## 2023-08-02 DIAGNOSIS — Z170 Estrogen receptor positive status [ER+]: Secondary | ICD-10-CM

## 2023-08-02 NOTE — Progress Notes (Signed)
 [name]   [phone] [phone]   Clinic Follow up Note   Patient Care Team: [place], [name], MD as PCP - General (Family Medicine) End, Veryl Gottron, MD as PCP - Cardiology (Cardiology) Sonja Mainville, MD as Consulting Physician (Hematology) Johna Myers, MD as Consulting Physician (Radiation Oncology) Enid Harry, MD as Consulting Physician (General Surgery) Debbie Fails, Laura Polio, NP as Nurse Practitioner (Hematology and Oncology) Alver Austin, [facility] (Inactive) as Pharmacist (Pharmacist) Burton, Lacie K, NP as Nurse Practitioner (Nurse Practitioner) 08/02/2023  I connected with Camylle Pulliam-McEachean on 08/02/23 at 11:20 AM EDT by telephone and verified that I am speaking with the correct person using two identifiers.   I discussed the limitations, risks, security and privacy concerns of performing an evaluation and management service by telephone and the availability of in person appointments. I also discussed with the patient that there may be a patient responsible charge related to this service. The patient expressed understanding and agreed to proceed.   Patient's location:  Home  Provider's location:  Office    CHIEF COMPLAINT: Follow-up of breast cancer and ITP   CURRENT THERAPY: Surveillance  Assessment & Plan  Malignant neoplasm of upper-outer quadrant of left breast in female, estrogen receptor positive (HCC) Stage IB, pT2N0M0, ER+/PR+, HER2-, Grade II -diagnosed in 11/2016. S/p left lumpectomy and adjuvant radiation.  -Oncotype Dx showed low risk -She started Tamoxifen  in 06/2017, discontinued 09/2020 due to postmenopausal bleeding. She is now on surveillance. 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 No bleeding symptoms  - Her recent platelet count has been around 60K, no bleeding - I initially recommended Promacta  to improve her platelet count due to her need of  anticoagulation for atrial fibrillation. - Due to her overall health deterioration, recent fall, and the goal of care is palliative, not to prolong her life, I recommend observation of ITP only.  CAD, Afib, CHF, Aortic Stenosis, H/o Stroke, HTN, dementia  - Follow-up with PCP and cardiology - Due to her risk of fall and bleeding, and palliative goal of care, I think is reasonable to stop Eliquis . - She has a follow-up with her PCP virtually tomorrow and will discuss  Plan - I spoke with patient and her mother in law who is her caregiver now.  Due to her worsening dementia, and the difficulty of getting her out to doctors office, I think the care focus should be palliative.  I recommend stop Eliquis  due to her risk of fall and bleeding.  I do not recommend medical treatment for her thrombocytopenia. - I will see her as needed.  SUMMARY OF ONCOLOGIC HISTORY: Oncology History Overview Note  Cancer Staging Malignant neoplasm of upper-outer quadrant of left breast in female, estrogen receptor positive (HCC) Staging form: Breast, AJCC 8th Edition - Clinical stage from 12/13/2016: Stage IB (cT2, cN0, cM0, G2, ER: Positive, PR: Positive, HER2: Negative) - Signed by Sonja Manila, MD on 12/21/2016 - Pathologic stage from 01/14/2017: Stage IA (pT2, pN0, cM0, G1, ER: Positive, PR: Positive, HER2: Negative, Oncotype DX score: 4) - Signed by Sonja Fowler, MD on 04/17/2017     Malignant neoplasm of upper-outer quadrant of left breast in female, estrogen receptor positive (HCC)  12/06/2016 Mammogram   Diagnostic Mammogram 12/06/16 IMPRESSION:  Suspicious mass in the left breast at 3 o'clock 2 cm from the nipple measuring 1.9 x 1.1 x 2.2 cm. RECOMMENDATION: Ultrasound-guided core biopsy of the mass in the 3 o'clock region of the  left breast is recommended. The biopsy will be scheduled at the patient's convenience.   12/13/2016 Initial Biopsy   Diagnosis 12/13/16 Breast, left, needle core biopsy, 3:00  o'clock, 2cmfn - INVASIVE DUCTAL CARCINOMA - SEE COMMENT   12/16/2016 Initial Diagnosis   Malignant neoplasm of upper-outer quadrant of left breast in female, estrogen receptor positive (HCC)   12/17/2016 Receptors her2   Estrogen Receptor: 100%, POSITIVE, STRONG STAINING INTENSITY Progesterone Receptor: 100%, POSITIVE, STRONG STAINING INTENSITY Proliferation Marker Ki67: 30% HER2 - NEGATIVE    01/14/2017 Surgery   Left breast lumpectomy with Dr. Delane Fear   01/14/2017 Pathology Results   Diagnosis 01/14/17 1. Breast, lumpectomy, Left - INVASIVE DUCTAL CARCINOMA, GRADE I/III, SPANNING 2.1 CM. - DUCTAL CARCINOMA IN SITU, LOW GRADE. - INVASIVE CARCINOMA IS BROADLY PRESENT AT THE INFERIOR MARGIN OF SPECIMEN 1. - DUCTAL CARCINOMA IN SITU IS FOCALLY PRESENT AT THE INFERIOR MARGIN OF SPECIMEN 1 AND BROADLY LESS THAN 0.1 CM TO THE LATERAL MARGIN OF SPECIMEN 1. - SEE ONCOLOGY TABLE BELOW. 2. Breast, excision, Additional medial margin left - DUCTAL CARCINOMA IN SITU, LOW GRADE. - DUCTAL CARCINOMA IS FOCALLY LESS THAN 0.1 CM TO THE NEW MARGIN OF SPECIMEN 2. 3. Breast, excision, Additional lateral margin left - DUCTAL CARCINOMA IN SITU, LOW GRADE. - DUCTAL CARCINOMA IN SITU IS GREATER THAN 0.2 CM TO ALL MARGINS. 4. Breast, excision, Additional superior margin left - DUCTAL CARCINOMA IN SITU, LOW GRADE. - DUCTAL CARCINOMA IN SITU IS BROADLY LESS THAN 0.1 CM TO THE NEW MARGIN OF SPECIMEN 4. 5. Lymph node, sentinel, biopsy, Left axillary - THERE IS NO EVIDENCE OF CARCINOMA IN 1 OF 1 LYMPH NODE (0/1). 6. Breast, excision, Additional inferior margin left - DUCTAL CARCINOMA IN SITU, LOW GRADE. - DUCTAL CARCINOMA IN SITU IS GREATER THAN 0.2 CM TO ALL MARGINS.    01/14/2017 Oncotype testing   Her oncotype recurrence score is 4 and her distance recurrent on Tamoxifen  alone is 3%.   03/08/2017 - 04/05/2017 Radiation Therapy   RT with Dr. Jeryl Moris    06/2017 -  Anti-estrogen oral therapy    Tamoxifen  daily   12/15/2017 Mammogram   12/15/2017 Mammogram IMPRESSION: New lumpectomy site left breast. No mammographic evidence of malignancy in either breast.     Discussed the use of AI scribe software for clinical note transcription with the patient, who gave verbal consent to proceed.  History of Present Illness      REVIEW OF SYSTEMS:   Constitutional: Denies fevers, chills or abnormal weight loss Eyes: Denies blurriness of vision Ears, nose, mouth, throat, and face: Denies mucositis or sore throat Respiratory: Denies cough, dyspnea or wheezes Cardiovascular: Denies palpitation, chest discomfort or lower extremity swelling Gastrointestinal:  Denies nausea, heartburn or change in bowel habits Skin: Denies abnormal skin rashes Lymphatics: Denies new lymphadenopathy or easy bruising Neurological:Denies numbness, tingling or new weaknesses Behavioral/Psych: Mood is stable, no new changes  All other systems were reviewed with the patient and are negative.  MEDICAL HISTORY:  Past Medical History:  Diagnosis Date   Anticoagulated on Coumadin     managed by cardiology   Anxiety    Chronic constipation    Chronic systolic CHF (congestive heart failure) (HCC) 2016   cardiologist--- dr end and followed by CHF clinic   CKD (chronic kidney disease), stage III (HCC)    COPD (chronic obstructive pulmonary disease) (HCC)    previous had seen pulmonology --- dr Waymond Hailey, note in epic 03-14-2016, dx COPD GOLD 0   Coronary artery disease  cardiologist--- dr Richmond Chapman;  last cardiac cath 01-10-20202 mild nonobstructive disease proxLAD;  per pt, had LHC prior to AVR in [place] 2016   DOE (dyspnea on exertion)    GERD (gastroesophageal reflux disease)    History of 2019 novel coronavirus disease (COVID-19) 12/19/2019   hospital admission w/ covid pneumonia , no intubatiion,  per pt symptoms resolved and back to baseline   History of cerebrovascular accident (CVA) with residual deficit     CVA in 2000 without residuals;  CVA post op AVR 01/ 2016 with residual right hand weakness   History of gastritis    History of pneumothorax    s/p right vats 11-19-2014 and  s/p left vats  12-13-2015   (both spontenaous due to bleb)   Hyperlipidemia    Hypertension    Hyperthyroidism    per pt followed by pcp,  dx approx 2014   Malignant neoplasm of upper-outer quadrant of left breast in female, 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   oncologist-- dr Maryalice Smaller--- dx 10/ 2018  ,  s/p left lumptectomy with node dissection;  completed radiation 04-15-2017, no chemo   Multiple thyroid  nodules    in care everywhere pt had left thyroid  nodule biospy 09-18-2014 benign   Nausea, vomiting, and diarrhea 12/25/2022   NICM (nonischemic cardiomyopathy) (HCC) 2016   2016  ef 30%;  last echo in epic ef 45%   OA (osteoarthritis)    Osteoporosis    Paroxysmal atrial fibrillation [facility])    cardiologist--- dr end   Personal history of radiation therapy    left breast cancer  03-08-2017  to 04-05-2017   Thrombocytopenia [facility])    followed by dr Maryalice Smaller   Valvular stenosis, aortic 03/12/2014   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   Wears dentures    full upper    SURGICAL HISTORY: Past Surgical History:  Procedure Laterality Date   AORTIC VALVE REPLACEMENT  03/12/2014   [facility] in [place];  St Jude 23mm, medial Trifecta Bioprosthesis   BREAST LUMPECTOMY WITH RADIOACTIVE SEED AND SENTINEL LYMPH NODE BIOPSY Left 01/14/2017   Procedure: LEFT BREAST LUMPECTOMY WITH RADIOACTIVE SEED AND SENTINEL LYMPH NODE BIOPSY;  Surgeon: Enid Harry, MD;  Location: [facility] OR;  Service: General;  Laterality: Left;   CARDIAC CATHETERIZATION  02/12/2014   [facility] in [place]   HYSTEROSCOPY WITH D & C N/A 11/17/2020   Procedure: DILATATION AND CURETTAGE /HYSTEROSCOPY WITH MYOSURE;  Surgeon: Greta Leatherwood, MD;  Location: [facility];  Service: Gynecology;   Laterality: N/A;   INTRAMEDULLARY (IM) NAIL INTERTROCHANTERIC Left 12/10/2015   Procedure: INTRAMEDULLARY (IM) NAIL INTERTROCHANTRIC;  Surgeon: Wes Hamman, MD;  Location: [facility] OR;  Service: Orthopedics;  Laterality: Left;   OPERATIVE ULTRASOUND N/A 11/17/2020   Procedure: OPERATIVE ULTRASOUND;  Surgeon: Greta Leatherwood, MD;  Location: [facility];  Service: Gynecology;  Laterality: N/A;   PLEURADESIS Left 12/03/2015   Procedure: PLEURADESIS;  Surgeon: Zelphia Higashi, MD;  Location[facility] OR;  Service: Thoracic;  Laterality: Left;   RESECTION OF APICAL BLEB Left 12/03/2015   Procedure: BLEBECTOMY;  Surgeon: Zelphia Higashi, MD;  Location: [facility] OR;  Service: Thoracic;  Laterality: Left;   RIGHT/LEFT HEART CATH AND CORONARY ANGIOGRAPHY N/A 03/10/2020   Procedure: RIGHT/LEFT HEART CATH AND CORONARY ANGIOGRAPHY;  Surgeon: Sammy Crisp, MD;  Location: [facility] INVASIVE CV LAB;  Service: Cardiovascular;  Laterality: N/A;   VIDEO ASSISTED THORACOSCOPY  Left 12/03/2015   Procedure: VIDEO ASSISTED THORACOSCOPY;  Surgeon: Zelphia Higashi, MD;  Location: [facility] OR;  Service: Thoracic;  Laterality: Left;   VIDEO ASSISTED THORACOSCOPY (VATS) W/TALC  PLEUADESIS Right 11/19/2014   in [place]    I have reviewed the social history and family history with the patient and they are unchanged from previous note.  ALLERGIES:  is allergic to tetanus toxoid adsorbed, zestril  [lisinopril ], and peanut (diagnostic).  MEDICATIONS:  Current Outpatient Medications  Medication Sig Dispense Refill   acetaminophen  (TYLENOL ) 325 MG tablet Take 2 tablets (650 mg total) by mouth every 6 (six) hours as needed for mild pain (pain score 1-3), fever or headache.     alendronate  (FOSAMAX ) 70 MG tablet Take 1 tablet (70 mg total) by mouth every 7 (seven) days. Take with a full glass of water on an empty stomach. 12 tablet 0   apixaban  (ELIQUIS ) 5 MG TABS tablet Take 1 tablet (5 mg total) by mouth 2  (two) times daily. 180 tablet 1   carvedilol  (COREG ) 25 MG tablet Take 1 tablet (25 mg total) by mouth 2 (two) times daily with a meal. 180 tablet 3   eltrombopag  (PROMACTA ) 25 MG tablet Take 1 tablet (25 mg total) by mouth daily. Take on an empty stomach, 1 hour before a meal or 2 hours after. (Patient not taking: Reported on 07/20/2023) 30 tablet 3   hydrOXYzine (VISTARIL) 25 MG capsule Take 25 mg by mouth daily.     losartan  (COZAAR ) 25 MG tablet Take 1 tablet (25 mg total) by mouth daily. 30 tablet 2   methimazole  (TAPAZOLE ) 5 MG tablet Take 5 mg by mouth 3 (three) times daily. (Patient not taking: Reported on 07/20/2023)     omeprazole  (PRILOSEC) 40 MG capsule Take 1 capsule (40 mg total) by mouth daily. (Patient taking differently: Take 40 mg by mouth daily as needed (heartburn).) 60 capsule 0   polyethylene glycol (MIRALAX  / GLYCOLAX ) 17 g packet Take 17 g by mouth daily as needed for moderate constipation.     rosuvastatin  (CRESTOR ) 20 MG tablet Take 1 tablet (20 mg total) by mouth daily. Please cancel all previous orders for current medication. Change in dosage or pill size. 90 tablet 3   sertraline (ZOLOFT) 50 MG tablet Take 50 mg by mouth daily.     spironolactone  (ALDACTONE ) 25 MG tablet Take 1 tablet (25 mg total) by mouth daily. 90 tablet 3   traMADol  (ULTRAM ) 50 MG tablet Take 50 mg by mouth every 8 (eight) hours as needed.     trolamine salicylate (ASPERCREME) 10 % cream Apply 1 application topically as needed for muscle pain.     zolpidem  (AMBIEN ) 5 MG tablet Take 5 mg by mouth at bedtime as needed for sleep. (Patient not taking: Reported on 07/20/2023)     No current facility-administered medications for this visit.    PHYSICAL EXAMINATION: Not performed   LABORATORY DATA:  I have reviewed the data as listed    Latest Ref Rng & Units 07/28/2023   12:21 PM 06/28/2023    9:57 AM 06/22/2023    9:34 PM  CBC  WBC 4.0 - 10.5 K/uL 5.6  4.6    Hemoglobin 12.0 - 15.0 g/dL 40.9  81.1   91.4   Hematocrit 36.0 - 46.0 % 41.6  36.5  40.0   Platelets 150 - 400 K/uL 60  60          Latest Ref Rng & Units 07/28/2023  12:21 PM 06/28/2023    9:57 AM 06/22/2023    9:34 PM  CMP  Glucose 70 - 99 mg/dL 78  914  782   BUN 8 - 23 mg/dL 20  14  22    Creatinine 0.44 - 1.00 mg/dL 9.56  2.13  0.86   Sodium 135 - 145 mmol/L 138  138  139   Potassium 3.5 - 5.1 mmol/L 4.5  4.2  3.9   Chloride 98 - 111 mmol/L 106  109  110   CO2 22 - 32 mmol/L 27  25    Calcium  8.9 - 10.3 mg/dL 57.8  9.6    Total Protein 6.5 - 8.1 g/dL 7.6  6.8    Total Bilirubin 0.0 - 1.2 mg/dL 0.6  0.6    Alkaline Phos 38 - 126 U/L 75  62    AST 15 - 41 U/L 19  16    ALT 0 - 44 U/L 11  12        RADIOGRAPHIC STUDIES: I have personally reviewed the radiological images as listed and agreed with the findings in the report. No results found.     I discussed the assessment and treatment plan with the patient. The patient was provided an opportunity to ask questions and all were answered. The patient agreed with the plan and demonstrated an understanding of the instructions.   The patient was advised to call back or seek an in-person evaluation if the symptoms worsen or if the condition fails to improve as anticipated.  I provided 25 minutes of non face-to-face telephone visit time during this encounter, including review of chart and various tests results, discussions about plan of care and coordination of care plan.    Sonja Wildwood, MD 08/02/23

## 2023-08-03 ENCOUNTER — Encounter: Payer: Self-pay | Admitting: Family Medicine

## 2023-08-03 ENCOUNTER — Telehealth (INDEPENDENT_AMBULATORY_CARE_PROVIDER_SITE_OTHER): Admitting: Family Medicine

## 2023-08-03 VITALS — Ht 67.0 in

## 2023-08-03 DIAGNOSIS — N1832 Chronic kidney disease, stage 3b: Secondary | ICD-10-CM

## 2023-08-03 DIAGNOSIS — C539 Malignant neoplasm of cervix uteri, unspecified: Secondary | ICD-10-CM | POA: Insufficient documentation

## 2023-08-03 DIAGNOSIS — I10 Essential (primary) hypertension: Secondary | ICD-10-CM

## 2023-08-03 DIAGNOSIS — J449 Chronic obstructive pulmonary disease, unspecified: Secondary | ICD-10-CM | POA: Diagnosis not present

## 2023-08-03 DIAGNOSIS — F321 Major depressive disorder, single episode, moderate: Secondary | ICD-10-CM | POA: Diagnosis not present

## 2023-08-03 DIAGNOSIS — E0590 Thyrotoxicosis, unspecified without thyrotoxic crisis or storm: Secondary | ICD-10-CM

## 2023-08-03 DIAGNOSIS — E785 Hyperlipidemia, unspecified: Secondary | ICD-10-CM

## 2023-08-03 DIAGNOSIS — Z9181 History of falling: Secondary | ICD-10-CM

## 2023-08-03 DIAGNOSIS — M816 Localized osteoporosis [Lequesne]: Secondary | ICD-10-CM

## 2023-08-03 DIAGNOSIS — R32 Unspecified urinary incontinence: Secondary | ICD-10-CM | POA: Insufficient documentation

## 2023-08-03 MED ORDER — SERTRALINE HCL 100 MG PO TABS: 100.0000 mg | ORAL_TABLET | Freq: Every day | ORAL | 1 refills | Status: DC

## 2023-08-03 MED ORDER — METHIMAZOLE 5 MG PO TABS
5.0000 mg | ORAL_TABLET | Freq: Three times a day (TID) | ORAL | 1 refills | Status: DC
Start: 2023-08-03 — End: 2023-09-09

## 2023-08-03 MED ORDER — INCONTINENCE SUPPLY DISPOSABLE MISC: 2 refills | Status: DC

## 2023-08-03 NOTE — Assessment & Plan Note (Signed)
 Problem is not well controlled. Due to mobility issues, she is not interested in CBT. Sertraline dose increased from 50 mg to 10 mg daily. We discussed son side effects of medication and the risk of interaction with tramadol . Follow-up in 3 to 4 months, before if needed.

## 2023-08-03 NOTE — Assessment & Plan Note (Signed)
 She is not on pharmacologic treatment. Reports no symptoms.

## 2023-08-03 NOTE — Assessment & Plan Note (Addendum)
 LDL is not at goal, and 35 on 05/04/2023. Currently on rosuvastatin  20 mg daily. Continue low-fat diet. Follow-up with cardiologist.

## 2023-08-03 NOTE — Assessment & Plan Note (Addendum)
 She reports that she has not been on methimazole  since 03/2023. Last TSH 0.6 in 11/2022, when she was complaint with medication. Resume methimazole  5 mg 3 times daily.

## 2023-08-03 NOTE — Telephone Encounter (Signed)
 Received notification that patient will NOT be starting Promacta . Closing encounter.    Hansel Ley, CPhT Pharmacy Technician Coordinator [facility] [phone] (Ph) 08/03/2023 8:48 AM

## 2023-08-03 NOTE — Assessment & Plan Note (Signed)
 Last DEXA in 12/2020: Right hip T-score -3.3. She has not been on Fosamax  for a few months, having difficulty affording medication at this time, had been taking medication for 2-3 years. Continue fall precaution and adequate calcium  and vitamin D  supplementation. PT through [facility] will be arranged.

## 2023-08-03 NOTE — Assessment & Plan Note (Signed)
 Chronic. Prescription for supplies will be sent to pharmacy.

## 2023-08-03 NOTE — Assessment & Plan Note (Signed)
 Seen in the ED for fall on 06/22/23. A few of her chronic medical conditions as well as medications increased the risk for falls. PT through [facility] will be arranged. Continue fall precautions.

## 2023-08-03 NOTE — Telephone Encounter (Signed)
 Received notification that patient will NOT be starting Promacta . Closing out encounter.    Hansel Ley, CPhT Pharmacy Technician Coordinator [facility] [phone] (Ph) 08/03/2023 8:50 AM

## 2023-08-03 NOTE — Assessment & Plan Note (Signed)
 Otherwise stable, 07/28/23 Cr 1.3 and e GFR 42. Continue adequate hydration, low-salt diet, avoidance of NSAIDs. Having CMP regularly at her oncologist's office.

## 2023-08-03 NOTE — Progress Notes (Signed)
 Virtual Visit via Video Note I connected with Danielle [name] on @T @ by a video enabled telemedicine application and verified that I am speaking with the correct person using two identifiers. Location patient: home Location provider:work office Persons participating in the virtual visit: patient, provider, mother-in-law, and scribes.  I discussed the limitations of evaluation and management by telemedicine and the availability of in person appointments. The patient expressed understanding and agreed to proceed.  Chief Complaint  Patient presents with   Medical Management of Chronic Issues   supplies    inserts, pullups and wipes.Danielle [name]   HPI: Danielle [name] is a 78 y.o female with PMHx significant for hyperthyroidism, HTN,HLD,aortic valve disease s/p valve replacement in 03/2014, PAF on chronic anticoagulation, HFrEF, chronic pain,CKD III, and breast cancer s/p left lumpectomy in 12/2016, who is here today for chronic disease management.   Last seen on 05-05-2022.  Her mother-in-law helps to complement history.  She is not ambulatory, she is in a wheelchair and denies any skin in ulcers/sores.   Hypertension/HFrEF,PAF Relevant Medications: Sprionolactone 25 mg daily, Coreg  25 mg 2/daily, Eliquis  5 mg twice daily, BP readings at home: not checking her BP Negative for unusual or severe headache, visual changes, exertional chest pain, dyspnea,  focal weakness, or worsening edema. Lab Results  Component Value Date   NA 138 07/28/2023   CL 106 07/28/2023   K 4.5 07/28/2023   CO2 27 07/28/2023   BUN 20 07/28/2023   CREATININE 1.30 (H) 07/28/2023   GFRNONAA 42 (L) 07/28/2023   CALCIUM  10.3 07/28/2023   PHOS 3.6 12/31/2022   ALBUMIN  4.2 07/28/2023   GLUCOSE 78 07/28/2023   Hyperlipidemia: Currently on Rosuvastatin  20 mg daily.  Lab Results  Component Value Date   CHOL 210 (H) 05/04/2023   HDL 61 05/04/2023   LDLCALC 135 (H) 05/04/2023   TRIG 72 05/04/2023    CHOLHDL 3.4 05/04/2023   Hyperthyroidism: Per pt's mother in law, pt has been off methimazole  since 03/2023 due to difficulties with getting the medication refilled.  Lab Results  Component Value Date   TSH 0.649 12/25/2022   Osteoporosis  Per pt's mother in law, pt is not taking her Alendronate  because her insurance does not cover it anymore.   She denies constipation, heartburn, or acid reflux. Pt has not needed to take her Omeprazole , has not taken it for a while.  Depression: Her husband passed away in 05/06/23.  Pt is taking Zoloft 50 mg daily and states its not helping with her depression.  Pt is sleeping well, she is no longer on Ambien . Also taking Tramadol  50 mg at night for chronic pain.  Thrombocytopenia: Currently she is on Promacta  25 mg daily. Pt had labs done through oncology/hematology recently. Denies bruising, bleeding, or gum bleeds.  Lab Results  Component Value Date   WBC 5.6 07/28/2023   HGB 13.9 07/28/2023   HCT 41.6 07/28/2023   MCV 94.5 07/28/2023   PLT 60 (L) 07/28/2023      Latest Ref Rng & Units 07/28/2023   12:21 PM 06/28/2023    9:57 AM 06/22/2023    9:34 PM  CBC  WBC 4.0 - 10.5 K/uL 5.6  4.6    Hemoglobin 12.0 - 15.0 g/dL 98.1  19.1  47.8   Hematocrit 36.0 - 46.0 % 41.6  36.5  40.0   Platelets 150 - 400 K/uL 60  60     COPD:  She is no longer using her inhaler. Pt is not on supplemental  oxygen . Denies coughing, wheezing, and breathing difficulties.   Pt's mother in law requests home health care to help with ADLs. History of falls, last seen in the ED for a fall on 06/22/2023. Left knee x-ray on 06/22/2023 did not show postoperative and degenerative changes without evidence of an acute osseous abnormality. Left knee CT without contrast on 06/23/2023: Tricompartmental severe degenerative changes of the knee, negative for acute traumatic injury.  She is also requesting urine incontinence supplies, pull-ups. No problems with stool  incontinence. Negative for dysuria, gross hematuria, or change urinary frequency.  ROS: See pertinent positives and negatives per HPI.  Past Medical History:  Diagnosis Date   Anticoagulated on Coumadin     managed by cardiology   Anxiety    Chronic constipation    Chronic systolic CHF (congestive heart failure) (HCC) 2016   cardiologist--- dr end and followed by CHF clinic   CKD (chronic kidney disease), stage III (HCC)    COPD (chronic obstructive pulmonary disease) (HCC)    previous had seen pulmonology --- dr Waymond Hailey, note in epic 03-14-2016, dx COPD GOLD 0   Coronary artery disease    cardiologist--- dr Richmond Chapman;  last cardiac cath 01-10-20202 mild nonobstructive disease proxLAD;  per pt, had LHC prior to AVR in [place] 2016   DOE (dyspnea on exertion)    GERD (gastroesophageal reflux disease)    History of 2019 novel coronavirus disease (COVID-19) 12/19/2019   hospital admission w/ covid pneumonia , no intubatiion,  per pt symptoms resolved and back to baseline   History of cerebrovascular accident (CVA) with residual deficit    CVA in 2000 without residuals;  CVA post op AVR 01/ 2016 with residual right hand weakness   History of gastritis    History of pneumothorax    s/p right vats 11-19-2014 and  s/p left vats  12-13-2015   (both spontenaous due to bleb)   Hyperlipidemia    Hypertension    Hyperthyroidism    per pt followed by pcp,  dx approx 2014   Malignant neoplasm of upper-outer quadrant of left breast in female, 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   oncologist-- dr Maryalice Smaller--- dx 10/ 2018  ,  s/p left lumptectomy with node dissection;  completed radiation 04-15-2017, no chemo   Multiple thyroid  nodules    in care everywhere pt had left thyroid  nodule biospy 09-18-2014 benign   Nausea, vomiting, and diarrhea 12/25/2022   NICM (nonischemic cardiomyopathy) (HCC) 2016   2016  ef 30%;  last echo in epic ef 45%   OA (osteoarthritis)    Osteoporosis    Paroxysmal atrial  fibrillation [facility])    cardiologist--- dr end   Personal history of radiation therapy    left breast cancer  03-08-2017  to 04-05-2017   Thrombocytopenia [facility])    followed by dr Maryalice Smaller   Valvular stenosis, aortic 03/12/2014   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   Wears dentures    full upper   Past Surgical History:  Procedure Laterality Date   AORTIC VALVE REPLACEMENT  03/12/2014   [facility] in [place];  St Jude 23mm, medial Trifecta Bioprosthesis   BREAST LUMPECTOMY WITH RADIOACTIVE SEED AND SENTINEL LYMPH NODE BIOPSY Left 01/14/2017   Procedure: LEFT BREAST LUMPECTOMY WITH RADIOACTIVE SEED AND SENTINEL LYMPH NODE BIOPSY;  Surgeon: Enid Harry, MD;  Location[facility] OR;  Service: General;  Laterality: Left;   CARDIAC CATHETERIZATION  02/12/2014   [facility] in [place]  HYSTEROSCOPY WITH D & C N/A 11/17/2020   Procedure: DILATATION AND CURETTAGE /HYSTEROSCOPY WITH MYOSURE;  Surgeon: Greta Leatherwood, MD;  Location: [facility];  Service: Gynecology;  Laterality: N/A;   INTRAMEDULLARY (IM) NAIL INTERTROCHANTERIC Left 12/10/2015   Procedure: INTRAMEDULLARY (IM) NAIL INTERTROCHANTRIC;  Surgeon: Wes Hamman, MD;  Location: [facility] OR;  Service: Orthopedics;  Laterality: Left;   OPERATIVE ULTRASOUND N/A 11/17/2020   Procedure: OPERATIVE ULTRASOUND;  Surgeon: Greta Leatherwood, MD;  Location: [facility];  Service: Gynecology;  Laterality: N/A;   PLEURADESIS Left 12/03/2015   Procedure: PLEURADESIS;  Surgeon: Zelphia Higashi, MD;  Location: [facility] OR;  Service: Thoracic;  Laterality: Left;   RESECTION OF APICAL BLEB Left 12/03/2015   Procedure: BLEBECTOMY;  Surgeon: Zelphia Higashi, MD;  Location: [facility] OR;  Service: Thoracic;  Laterality: Left;   RIGHT/LEFT HEART CATH AND CORONARY ANGIOGRAPHY N/A 03/10/2020   Procedure: RIGHT/LEFT HEART CATH AND CORONARY ANGIOGRAPHY;  Surgeon: Sammy Crisp, MD;  Location: [facility]  INVASIVE CV LAB;  Service: Cardiovascular;  Laterality: N/A;   VIDEO ASSISTED THORACOSCOPY Left 12/03/2015   Procedure: VIDEO ASSISTED THORACOSCOPY;  Surgeon: Zelphia Higashi, MD;  Location: [facility] OR;  Service: Thoracic;  Laterality: Left;   VIDEO ASSISTED THORACOSCOPY (VATS) W/TALC  PLEUADESIS Right 11/19/2014   in [place]   Family History  Problem Relation Age of Onset   Diabetes Mother    Heart attack Mother 16   Diabetes Father    Lung cancer Father    Diabetes Sister    Thyroid  disease Sister    Diabetes Sister    HIV Brother    Colon polyps Neg Hx    Colon cancer Neg Hx    Social History   Socioeconomic History   Marital status: Married    Spouse name: Sherwood   Number of children: 0   Years of education: 12   Highest education level: Bachelor's degree (e.g., BA, AB, BS)  Occupational History   Occupation: Retired in 2004  Tobacco Use   Smoking status: Former    Current packs/day: 0.00    Average packs/day: 0.3 packs/day for 10.0 years (2.5 ttl pk-yrs)    Types: Cigarettes    Start date: 2002    Quit date: 2012    Years since quitting: 13.4   Smokeless tobacco: Never  Vaping Use   Vaping status: Never Used  Substance and Sexual Activity   Alcohol use: No   Drug use: Never   Sexual activity: Yes    Birth control/protection: Post-menopausal  Other Topics Concern   Not on file  Social History Narrative   Lives with husband.  Ambulated independently.   Right-handed.   No daily caffeine use.   Social Drivers of Corporate investment banker Strain: Low Risk  (02/04/2023)   Overall Financial Resource Strain (CARDIA)    Difficulty of Paying Living Expenses: Not hard at all  Food Insecurity: No Food Insecurity (02/04/2023)   Hunger Vital Sign    Worried About Running Out of Food in the Last Year: Never true    Ran Out of Food in the Last Year: Never true  Transportation Needs: Unmet Transportation Needs (07/13/2023)   PRAPARE - Transportation    Lack of  Transportation (Medical): Yes    Lack of Transportation (Non-Medical): Yes  Physical Activity: Insufficiently Active (02/04/2023)   Exercise Vital Sign    Days of Exercise per Week: 2 days    Minutes of  Exercise per Session: 20 min  Stress: No Stress Concern Present (02/04/2023)   [name]    Feeling of Stress : Not at all  Social Connections: Socially Integrated (02/04/2023)   Social Connection and Isolation Panel [NHANES]    Frequency of Communication with Friends and Family: More than three times a week    Frequency of Social Gatherings with Friends and Family: More than three times a week    Attends Religious Services: More than 4 times per year    Active Member of [name]: Yes    Attends Engineer, structural: More than 4 times per year    Marital Status: Married  Catering manager Violence: Not At Risk (02/04/2023)   Humiliation, Afraid, Rape, and Kick questionnaire    Fear of Current or Ex-Partner: No    Emotionally Abused: No    Physically Abused: No    Sexually Abused: No    Current Outpatient Medications:    acetaminophen  (TYLENOL ) 325 MG tablet, Take 2 tablets (650 mg total) by mouth every 6 (six) hours as needed for mild pain (pain score 1-3), fever or headache., Disp: , Rfl:    alendronate  (FOSAMAX ) 70 MG tablet, Take 1 tablet (70 mg total) by mouth every 7 (seven) days. Take with a full glass of water on an empty stomach., Disp: 12 tablet, Rfl: 0   apixaban  (ELIQUIS ) 5 MG TABS tablet, Take 1 tablet (5 mg total) by mouth 2 (two) times daily., Disp: 180 tablet, Rfl: 1   carvedilol  (COREG ) 25 MG tablet, Take 1 tablet (25 mg total) by mouth 2 (two) times daily with a meal., Disp: 180 tablet, Rfl: 3   eltrombopag  (PROMACTA ) 25 MG tablet, Take 1 tablet (25 mg total) by mouth daily. Take on an empty stomach, 1 hour before a meal or 2 hours after., Disp: 30 tablet, Rfl: 3   hydrOXYzine  (VISTARIL) 25 MG capsule, Take 25 mg by mouth daily., Disp: , Rfl:    Incontinence Supply Disposable MISC, Adult diapers extra large/female, Disp: 270 Units, Rfl: 2   losartan  (COZAAR ) 25 MG tablet, Take 1 tablet (25 mg total) by mouth daily., Disp: 30 tablet, Rfl: 2   rosuvastatin  (CRESTOR ) 20 MG tablet, Take 1 tablet (20 mg total) by mouth daily. Please cancel all previous orders for current medication. Change in dosage or pill size., Disp: 90 tablet, Rfl: 3   sertraline (ZOLOFT) 100 MG tablet, Take 1 tablet (100 mg total) by mouth daily., Disp: 90 tablet, Rfl: 1   traMADol  (ULTRAM ) 50 MG tablet, Take 50 mg by mouth every 8 (eight) hours as needed., Disp: , Rfl:    trolamine salicylate (ASPERCREME) 10 % cream, Apply 1 application topically as needed for muscle pain., Disp: , Rfl:    methimazole  (TAPAZOLE ) 5 MG tablet, Take 1 tablet (5 mg total) by mouth 3 (three) times daily., Disp: 270 tablet, Rfl: 1   spironolactone  (ALDACTONE ) 25 MG tablet, Take 1 tablet (25 mg total) by mouth daily., Disp: 90 tablet, Rfl: 3  EXAM:  VITALS per patient if applicable:Ht 5\' 7"  (1.702 m)   BMI 35.87 kg/m   GENERAL: alert, oriented, appears well and in no acute distress  HEENT: atraumatic, conjunctiva clear, no obvious abnormalities on inspection of external nose and ears  NECK: normal movements of the head and neck  LUNGS: on inspection no signs of respiratory distress, breathing rate appears normal, no obvious gross SOB, gasping or wheezing  CV: no obvious cyanosis  MS: moves all visible extremities without noticeable abnormality.  PSYCH/NEURO: pleasant and cooperative, no obvious depression or anxiety, speech and thought processing grossly intact  ASSESSMENT AND PLAN:  Discussed the following assessment and plan:  Hyperthyroidism Assessment & Plan: She reports that she has not been on methimazole  since 03/2023. Last TSH 0.6 in 11/2022, when she was complaint with medication. Resume methimazole   5 mg 3 times daily.  Orders: -     methIMAzole ; Take 1 tablet (5 mg total) by mouth 3 (three) times daily.  Dispense: 270 tablet; Refill: 1  Chronic obstructive pulmonary disease, unspecified COPD type (HCC) Assessment & Plan: She is not on pharmacologic treatment. Reports no symptoms.   Depression, major, single episode, moderate (HCC) Assessment & Plan: Problem is not well controlled. Due to mobility issues, she is not interested in CBT. Sertraline dose increased from 50 mg to 10 mg daily. We discussed son side effects of medication and the risk of interaction with tramadol . Follow-up in 3 to 4 months, before if needed.  Orders: -     Sertraline HCl; Take 1 tablet (100 mg total) by mouth daily.  Dispense: 90 tablet; Refill: 1  Stage 3b chronic kidney disease (HCC) Assessment & Plan: Otherwise stable, 07/28/23 Cr 1.3 and e GFR 42. Continue adequate hydration, low-salt diet, avoidance of NSAIDs. Having CMP regularly at her oncologist's office.   Essential hypertension Assessment & Plan: She is not monitoring BP regularly. Continue spironolactone  25 mg daily, carvedilol  25 mg twice daily. Continue low-salt diet.   Incontinence of urine in female Assessment & Plan: Chronic. Prescription for supplies will be sent to pharmacy.  Orders: -     Incontinence Supply Disposable; Adult diapers extra large/female  Dispense: 270 Units; Refill: 2  At risk for falls Assessment & Plan: Seen in the ED for fall on 06/22/23. A few of her chronic medical conditions as well as medications increased the risk for falls. PT through [facility] will be arranged. Continue fall precautions.  Orders: -     Ambulatory referral to Home Health  Localized osteoporosis without current pathological fracture Assessment & Plan: Last DEXA in 12/2020: Right hip T-score -3.3. She has not been on Fosamax  for a few months, having difficulty affording medication at this time, had been taking medication for 2-3  years. Continue fall precaution and adequate calcium  and vitamin D  supplementation. PT through [facility] will be arranged.   Hyperlipidemia LDL goal <70 Assessment & Plan: LDL is not at goal, and 35 on 05/04/2023. Currently on rosuvastatin  20 mg daily. Continue low-fat diet. Follow-up with cardiologist.   I spent a total of 42 minutes in both face to face and non face to face activities for this visit on the date of this encounter. During this time history was obtained and documented,medication list reviewed, prior labs/imaging reviewed, and assessment/plan discussed. She is not having heartburn or GERD like symptoms, so recommend not to resume omeprazole .  We discussed possible serious and likely etiologies, options for evaluation and workup, limitations of telemedicine visit vs in person visit, treatment, treatment risks and precautions. The patient was advised to call back or seek an in-person evaluation if the symptoms worsen or if the condition fails to improve as anticipated. I discussed the assessment and treatment plan with the patient. The patient was provided an opportunity to ask questions and all were answered. The patient agreed with the plan and demonstrated an understanding of the instructions.  Return in about 4 months (around  12/03/2023) for chronic problems.  I, Fritz Jewel Wierda, acting as a scribe for Ziair Penson [place], MD., have documented all relevant documentation on the behalf of Danielle Travaglini [place], MD, as directed by  Meade Hogeland [place], MD while in the presence of Danielle Lukes [place], MD.   I, Custer Pimenta [place], MD, have reviewed all documentation for this visit. The documentation on 08/03/23 for the exam, diagnosis, procedures, and orders are all accurate and complete.   Chi Garlow [place], MD

## 2023-08-03 NOTE — Assessment & Plan Note (Signed)
 She is not monitoring BP regularly. Continue spironolactone  25 mg daily, carvedilol  25 mg twice daily. Continue low-salt diet.

## 2023-08-04 ENCOUNTER — Telehealth: Payer: Self-pay

## 2023-08-04 NOTE — Telephone Encounter (Signed)
 Oncology Pharmacist Encounter  Per Dr. Maryalice Smaller, patient has declined treatment with Promacta  at this time. Prescription discontinued from patients medication list.  Shanyn Preisler, PharmD Hematology/Oncology Clinical Pharmacist [facility] [phone]

## 2023-08-10 ENCOUNTER — Telehealth: Payer: Self-pay | Admitting: Family Medicine

## 2023-08-10 NOTE — Telephone Encounter (Signed)
 Copied from CRM [id number]. Topic: Referral - Status >> Aug 09, 2023  3:33 PM Earnestine Goes B wrote: Reason for CRM: pt's mother in law called  ms. Broadus Canes to follow up on referral for home care services please call her with a update [phone]

## 2023-08-12 ENCOUNTER — Telehealth: Payer: Self-pay

## 2023-08-12 NOTE — Telephone Encounter (Signed)
 I left Polly Brink a voicemail letting him know the VO is approved.

## 2023-08-12 NOTE — Telephone Encounter (Signed)
 Copied from CRM #[id number]. Topic: Clinical - Home Health Verbal Orders >> Aug 12, 2023 11:30 AM Dimple Francis wrote: Caller/Agency: [facility] Callback Number: [phone] Service Requested: Physical Therapy Frequency: 1 x week  for 8 weeks   Any new concerns about the patient? No

## 2023-08-17 ENCOUNTER — Telehealth: Payer: Self-pay

## 2023-08-17 NOTE — Telephone Encounter (Signed)
 I called and spoke with Danielle [name]  and she is aware of message below. She will call back if she has any further questions/issues.

## 2023-08-17 NOTE — Telephone Encounter (Signed)
 Copied from CRM [id number]. Topic: General - Other >> Aug 17, 2023  2:25 PM Freya Jesus wrote: Reason for CRM: Patient mother [name]  is calling regarding patient current status and is requesting a call back from provider or her nurse. Call back: [phone]

## 2023-08-17 NOTE — Telephone Encounter (Signed)
 I called and spoke with [name] . Pt's condition is worsening. She will go over and pt will be completely naked and not know how she got that way. PT does come out. Patient only wants to sit in her recliner. She is still eating, but not drinking as much as she should. Advised [name]  I'd send a message over to PCP to see what other options there are at this point.

## 2023-08-17 NOTE — Telephone Encounter (Signed)
 I last saw her in the office in 04/2022 and recently on video. Some of her co morbilities could contribute to some of the symptoms, depression can affect motivation as well as cognition. She has always have problems with mobility and recent health issues can make this worse. PT is the best option at this time. If care cannot be provided at home, she may need to consider assisted living. Thanks, BJ

## 2023-08-31 ENCOUNTER — Other Ambulatory Visit: Payer: Self-pay | Admitting: Family Medicine

## 2023-08-31 NOTE — Telephone Encounter (Unsigned)
 Copied from CRM [id number]. Topic: Clinical - Medication Refill >> Aug 31, 2023  4:44 PM Donee H wrote: Medication: losartan  (COZAAR ) 25 MG tablet   Has the patient contacted their pharmacy? No (Agent: If no, request that the patient contact the pharmacy for the refill. If patient does not wish to contact the pharmacy document the reason why and proceed with request.) (Agent: If yes, when and what did the pharmacy advise?)  This is the patient's preferred pharmacy:  [id number] - [place] - [address] [place]) [place] [address] Phone: [phone] Fax: [phone]   Is this the correct pharmacy for this prescription? Yes If no, delete pharmacy and type the correct one.   Has the prescription been filled recently? No  Is the patient out of the medication? No. Patient is not completely out of medication but is low on medication. Patient would actually like to know what medication is for and if it's necessary to keep taking. Would like a follow up callback to  [phone]    Has the patient been seen for an appointment in the last year OR does the patient have an upcoming appointment? Yes  Can we respond through MyChart? Yes  Agent: Please be advised that Rx refills may take up to 3 business days. We ask that you follow-up with your pharmacy.

## 2023-09-01 MED ORDER — LOSARTAN POTASSIUM 25 MG PO TABS: 25.0000 mg | ORAL_TABLET | Freq: Every day | ORAL | 2 refills | Status: DC

## 2023-09-07 ENCOUNTER — Telehealth: Payer: Self-pay

## 2023-09-07 NOTE — Telephone Encounter (Signed)
VO given to [facility]

## 2023-09-07 NOTE — Telephone Encounter (Signed)
 Copied from CRM [id number]. Topic: Clinical - Home Health Verbal Orders >> Sep 07, 2023 11:52 AM Larissa RAMAN wrote: Caller/Agency: Elenor, PT/ [facility] Callback Number: [phone] Service Requested: Occupational Therapy evaluation  Frequency: 1X1  Any new concerns about the patient? No

## 2023-09-09 ENCOUNTER — Other Ambulatory Visit: Payer: Self-pay

## 2023-09-09 DIAGNOSIS — E0590 Thyrotoxicosis, unspecified without thyrotoxic crisis or storm: Secondary | ICD-10-CM

## 2023-09-09 MED ORDER — METHIMAZOLE 5 MG PO TABS: 5.0000 mg | ORAL_TABLET | Freq: Three times a day (TID) | ORAL | 1 refills | Status: DC

## 2023-09-27 ENCOUNTER — Other Ambulatory Visit: Payer: Self-pay

## 2023-09-27 DIAGNOSIS — E0590 Thyrotoxicosis, unspecified without thyrotoxic crisis or storm: Secondary | ICD-10-CM

## 2023-09-27 MED ORDER — METHIMAZOLE 5 MG PO TABS: 5.0000 mg | ORAL_TABLET | Freq: Three times a day (TID) | ORAL | 0 refills | Status: DC

## 2023-09-28 ENCOUNTER — Other Ambulatory Visit: Payer: Self-pay

## 2023-09-28 MED ORDER — SPIRONOLACTONE 25 MG PO TABS: 25.0000 mg | ORAL_TABLET | Freq: Every day | ORAL | 0 refills | Status: DC

## 2023-11-02 ENCOUNTER — Telehealth (INDEPENDENT_AMBULATORY_CARE_PROVIDER_SITE_OTHER): Admitting: Family Medicine

## 2023-11-02 ENCOUNTER — Encounter: Payer: Self-pay | Admitting: Family Medicine

## 2023-11-02 VITALS — Ht 67.0 in

## 2023-11-02 DIAGNOSIS — G319 Degenerative disease of nervous system, unspecified: Secondary | ICD-10-CM

## 2023-11-02 DIAGNOSIS — F331 Major depressive disorder, recurrent, moderate: Secondary | ICD-10-CM

## 2023-11-02 DIAGNOSIS — R269 Unspecified abnormalities of gait and mobility: Secondary | ICD-10-CM

## 2023-11-02 DIAGNOSIS — I5022 Chronic systolic (congestive) heart failure: Secondary | ICD-10-CM

## 2023-11-02 DIAGNOSIS — G894 Chronic pain syndrome: Secondary | ICD-10-CM

## 2023-11-02 DIAGNOSIS — I480 Paroxysmal atrial fibrillation: Secondary | ICD-10-CM

## 2023-11-02 DIAGNOSIS — I10 Essential (primary) hypertension: Secondary | ICD-10-CM | POA: Diagnosis not present

## 2023-11-02 DIAGNOSIS — R32 Unspecified urinary incontinence: Secondary | ICD-10-CM

## 2023-11-02 DIAGNOSIS — N1832 Chronic kidney disease, stage 3b: Secondary | ICD-10-CM

## 2023-11-02 DIAGNOSIS — J449 Chronic obstructive pulmonary disease, unspecified: Secondary | ICD-10-CM

## 2023-11-02 DIAGNOSIS — M816 Localized osteoporosis [Lequesne]: Secondary | ICD-10-CM

## 2023-11-02 MED ORDER — INCONTINENCE SUPPLY DISPOSABLE MISC: 2 refills | Status: DC

## 2023-11-02 NOTE — Patient Instructions (Signed)
 Lab Results  Component Value Date   TSH 0.649 12/25/2022

## 2023-11-02 NOTE — Progress Notes (Signed)
 Virtual Visit via Video Note I connected with Danielle [name] on 11:00 AM by a video enabled telemedicine application and verified that I am speaking with the correct person using two identifiers. Location patient: home Location provider:work or home office Persons participating in the virtual visit: patient, provider, mother-in-law, brother-in-law, physical therapist, and medical scribe  I discussed the limitations of evaluation and management by telemedicine and the availability of in person appointments. The patient expressed understanding and agreed to proceed.  Danielle [name] is a 78 y.o. female with a PMHx significant for hyperthyroidism, HTN,HLD,aortic valve disease s/p valve replacement in 03/2014, PAF on chronic anticoagulation, HFrEF, chronic pain,CKD III, and breast cancer s/p left lumpectomy in 12/2016, who is being seen today for Medical Management of Chronic Issues.  History mostly provided by caretakers apart of the visit: mother-in-law, physical therapist, and brother-in-law.  HPI:   Mother-in-law expresses that she believes Danielle [name] should be moved to a care facility as as she continues to decline physically and cognitively.  To ambulate, she is apparently using wheelchair, uses walker to transfer to/from wheelchair.   Patient herself says that she is feeling good and is compliant with her medications.  Medications reviewed:  Fosamax  70 MG weekly for Osteoporosis Eliquis  5 MG twice daily for PAF; Hx of PE Sertraline  100 MG daily for Anxiety/Depression Tramadol  50 MG twice daily for pain management. This is prescribed by NP Lee [place].   Pt lives alone currently with her nearby in-laws taking care of her in shifts. She does have a Child psychotherapist, who has only met with them once as far as mother-in-law recalls, a nurse through [employer], speech therapy, and for the past two months has been undergoing physical therapy at-home.  She apparently has  fallen though, and now has a fairly large contusion on her left leg.    One of her two physical therapist was present for this visit, and she reports that it is likely that the patient will soon be discharge from PT, as there is not much else to be done.  PT did note that since being informed of the fall, the patient's complaints of increased pain in her right knee VS left knee align with the event, but her overall ROM has not seemingly been affected. She is able to ambulate 5-10 feet as necessary, slowly and visibly in pain due to bilateral knee pain. She mostly uses her wheelchair. However, if cognition continues to decline then fall risk will increase.   Fortunately though, her blood pressures have remained stable around 110-120/60s and well-controlled according to her physical therapist.  Her speech therapist has apparently speculated patient may be developing Dementia, but the family has not been able to make many, if any, office visits due to how difficult it is to transfer her around, which is another reason they feel she needs to have 24/7 caretakers available.  Her brother-in-law requests a refill of XL pull-ups due to her urinary/fecal incontinence.  Apparently, he has been supplying these out of his own pocket as he couldn't get them filled before.  ROS: See pertinent positives and negatives per HPI.  Past Medical History:  Diagnosis Date   Anticoagulated on Coumadin     managed by cardiology   Anxiety    Chronic constipation    Chronic systolic CHF (congestive heart failure) (HCC) 2016   cardiologist--- dr end and followed by CHF clinic   CKD (chronic kidney disease), stage III (HCC)    COPD (chronic obstructive pulmonary disease) (HCC)  previous had seen pulmonology --- dr darlean, note in epic 03-14-2016, dx COPD GOLD 0   Coronary artery disease    cardiologist--- dr dayle;  last cardiac cath 01-10-20202 mild nonobstructive disease proxLAD;  per pt, had LHC prior to AVR in  [place] 2016   DOE (dyspnea on exertion)    GERD (gastroesophageal reflux disease)    History of 2019 novel coronavirus disease (COVID-19) 12/19/2019   hospital admission w/ covid pneumonia , no intubatiion,  per pt symptoms resolved and back to baseline   History of cerebrovascular accident (CVA) with residual deficit    CVA in 2000 without residuals;  CVA post op AVR 01/ 2016 with residual right hand weakness   History of gastritis    History of pneumothorax    s/p right vats 11-19-2014 and  s/p left vats  12-13-2015   (both spontenaous due to bleb)   Hyperlipidemia    Hypertension    Hyperthyroidism    per pt followed by pcp,  dx approx 2014   Malignant neoplasm of upper-outer quadrant of left breast in female, 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   oncologist-- dr lanny--- dx 10/ 2018  ,  s/p left lumptectomy with node dissection;  completed radiation 04-15-2017, no chemo   Multiple thyroid  nodules    in care everywhere pt had left thyroid  nodule biospy 09-18-2014 benign   Nausea, vomiting, and diarrhea 12/25/2022   NICM (nonischemic cardiomyopathy) (HCC) 2016   2016  ef 30%;  last echo in epic ef 45%   OA (osteoarthritis)    Osteoporosis    Paroxysmal atrial fibrillation [facility])    cardiologist--- dr end   Personal history of radiation therapy    left breast cancer  03-08-2017  to 04-05-2017   Thrombocytopenia [facility])    followed by dr lanny   Valvular stenosis, aortic 03/12/2014   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   Wears dentures    full upper    Past Surgical History:  Procedure Laterality Date   AORTIC VALVE REPLACEMENT  03/12/2014   [facility] in [place];  St Jude 23mm, medial Trifecta Bioprosthesis   BREAST LUMPECTOMY WITH RADIOACTIVE SEED AND SENTINEL LYMPH NODE BIOPSY Left 01/14/2017   Procedure: LEFT BREAST LUMPECTOMY WITH RADIOACTIVE SEED AND SENTINEL LYMPH NODE BIOPSY;  Surgeon: [name], MD;  Location: [facility] OR;   Service: General;  Laterality: Left;   CARDIAC CATHETERIZATION  02/12/2014   [facility] in [place]   HYSTEROSCOPY WITH D & C N/A 11/17/2020   Procedure: DILATATION AND CURETTAGE /HYSTEROSCOPY WITH MYOSURE;  Surgeon: Cathlyn JAYSON Nikki Bobie FORBES, MD;  Location: [facility];  Service: Gynecology;  Laterality: N/A;   INTRAMEDULLARY (IM) NAIL INTERTROCHANTERIC Left 12/10/2015   Procedure: INTRAMEDULLARY (IM) NAIL INTERTROCHANTRIC;  Surgeon: Kay CHRISTELLA Cummins, MD;  Location: [facility] OR;  Service: Orthopedics;  Laterality: Left;   OPERATIVE ULTRASOUND N/A 11/17/2020   Procedure: OPERATIVE ULTRASOUND;  Surgeon: Cathlyn JAYSON Nikki Bobie FORBES, MD;  Location: [facility];  Service: Gynecology;  Laterality: N/A;   PLEURADESIS Left 12/03/2015   Procedure: PLEURADESIS;  Surgeon: Elspeth JAYSON Millers, MD;  Location: [facility] OR;  Service: Thoracic;  Laterality: Left;   RESECTION OF APICAL BLEB Left 12/03/2015   Procedure: BLEBECTOMY;  Surgeon: Elspeth JAYSON Millers, MD;  Location: [facility] OR;  Service: Thoracic;  Laterality: Left;   RIGHT/LEFT HEART CATH AND CORONARY ANGIOGRAPHY N/A 03/10/2020   Procedure: RIGHT/LEFT HEART CATH AND CORONARY ANGIOGRAPHY;  Surgeon: Mady Bruckner, MD;  Location: [facility] INVASIVE CV LAB;  Service: Cardiovascular;  Laterality: N/A;   VIDEO ASSISTED THORACOSCOPY Left 12/03/2015   Procedure: VIDEO ASSISTED THORACOSCOPY;  Surgeon: Elspeth JAYSON Millers, MD;  Location: [facility] OR;  Service: Thoracic;  Laterality: Left;   VIDEO ASSISTED THORACOSCOPY (VATS) W/TALC  PLEUADESIS Right 11/19/2014   in [place]    Family History  Problem Relation Age of Onset   Diabetes Mother    Heart attack Mother 78   Diabetes Father    Lung cancer Father    Diabetes Sister    Thyroid  disease Sister    Diabetes Sister    HIV Brother    Colon polyps Neg Hx    Colon cancer Neg Hx     Social History   Socioeconomic History   Marital status: Married    Spouse name: Sherwood   Number of children: 0    Years of education: 12   Highest education level: Bachelor's degree (e.g., BA, AB, BS)  Occupational History   Occupation: Retired in 2004  Tobacco Use   Smoking status: Former    Current packs/day: 0.00    Average packs/day: 0.3 packs/day for 10.0 years (2.5 ttl pk-yrs)    Types: Cigarettes    Start date: 2002    Quit date: 2012    Years since quitting: 13.6   Smokeless tobacco: Never  Vaping Use   Vaping status: Never Used  Substance and Sexual Activity   Alcohol use: No   Drug use: Never   Sexual activity: Yes    Birth control/protection: Post-menopausal  Other Topics Concern   Not on file  Social History Narrative   Lives with husband.  Ambulated independently.   Right-handed.   No daily caffeine use.   Social Drivers of Corporate investment banker Strain: Low Risk  (02/04/2023)   Overall Financial Resource Strain (CARDIA)    Difficulty of Paying Living Expenses: Not hard at all  Food Insecurity: No Food Insecurity (02/04/2023)   Hunger Vital Sign    Worried About Running Out of Food in the Last Year: Never true    Ran Out of Food in the Last Year: Never true  Transportation Needs: Unmet Transportation Needs (07/13/2023)   PRAPARE - Administrator, Civil Service (Medical): Yes    Lack of Transportation (Non-Medical): Yes  Physical Activity: Insufficiently Active (02/04/2023)   Exercise Vital Sign    Days of Exercise per Week: 2 days    Minutes of Exercise per Session: 20 min  Stress: No Stress Concern Present (02/04/2023)   [name]    Feeling of Stress : Not at all  Social Connections: Socially Integrated (02/04/2023)   Social Connection and Isolation Panel    Frequency of Communication with Friends and Family: More than three times a week    Frequency of Social Gatherings with Friends and Family: More than three times a week    Attends Religious Services: More than 4 times per year     Active Member of [name]: Yes    Attends Banker Meetings: More than 4 times per year    Marital Status: Married  Catering manager Violence: Not At Risk (02/04/2023)   Humiliation, Afraid, Rape, and Kick questionnaire    Fear of Current or Ex-Partner: No    Emotionally Abused: No    Physically Abused: No    Sexually Abused: No     Current  Outpatient Medications:    acetaminophen  (TYLENOL ) 325 MG tablet, Take 2 tablets (650 mg total) by mouth every 6 (six) hours as needed for mild pain (pain score 1-3), fever or headache., Disp: , Rfl:    alendronate  (FOSAMAX ) 70 MG tablet, Take 1 tablet (70 mg total) by mouth every 7 (seven) days. Take with a full glass of water on an empty stomach., Disp: 12 tablet, Rfl: 0   apixaban  (ELIQUIS ) 5 MG TABS tablet, Take 1 tablet (5 mg total) by mouth 2 (two) times daily., Disp: 180 tablet, Rfl: 1   carvedilol  (COREG ) 25 MG tablet, Take 1 tablet (25 mg total) by mouth 2 (two) times daily with a meal., Disp: 180 tablet, Rfl: 3   hydrOXYzine (VISTARIL) 25 MG capsule, Take 25 mg by mouth daily., Disp: , Rfl:    losartan  (COZAAR ) 25 MG tablet, Take 1 tablet (25 mg total) by mouth daily., Disp: 30 tablet, Rfl: 2   methimazole  (TAPAZOLE ) 5 MG tablet, Take 1 tablet (5 mg total) by mouth 3 (three) times daily., Disp: 270 tablet, Rfl: 0   rosuvastatin  (CRESTOR ) 20 MG tablet, Take 1 tablet (20 mg total) by mouth daily. Please cancel all previous orders for current medication. Change in dosage or pill size., Disp: 90 tablet, Rfl: 3   sertraline  (ZOLOFT ) 100 MG tablet, Take 1 tablet (100 mg total) by mouth daily., Disp: 90 tablet, Rfl: 1   spironolactone  (ALDACTONE ) 25 MG tablet, Take 1 tablet (25 mg total) by mouth daily., Disp: 90 tablet, Rfl: 0   traMADol  (ULTRAM ) 50 MG tablet, Take 50 mg by mouth every 8 (eight) hours as needed., Disp: , Rfl:    trolamine salicylate (ASPERCREME) 10 % cream, Apply 1 application topically as needed for muscle  pain., Disp: , Rfl:    Incontinence Supply Disposable MISC, Adult diapers extra large/female, Disp: 270 Units, Rfl: 2  EXAM:  VITALS per patient if applicable:  GENERAL: alert, oriented, appears well and in no acute distress  HEENT: atraumatic, conjunctiva clear, no obvious abnormalities on inspection of external nose and ears  NECK: normal movements of the head and neck  LUNGS: on inspection no signs of respiratory distress, breathing rate appears normal, no obvious gross SOB, gasping or wheezing  CV: no obvious cyanosis  MS: moves all visible extremities without noticeable abnormality. Contusion noted on left knee and left lateral thigh.   PSYCH/NEURO: pleasant and cooperative, no obvious depression or anxiety, speech and thought processing grossly intact. Not oriented to day, month, or year, answered Friday, July, and 2024   ASSESSMENT AND PLAN:  Discussed the following assessment and plan:  Neurodegenerative cognitive impairment (HCC) - Plan: Ambulatory referral to Home Health, AMB Referral VBCI Care Management  Essential hypertension - Plan: Ambulatory referral to Home Health, AMB Referral VBCI Care Management  Paroxysmal atrial fibrillation (HCC) - Plan: Ambulatory referral to Home Health, AMB Referral VBCI Care Management  Chronic HFrEF (heart failure with reduced ejection fraction) (HCC) - Plan: Ambulatory referral to Home Health, AMB Referral VBCI Care Management  Chronic pain disorder - Plan: Ambulatory referral to Home Health, AMB Referral VBCI Care Management  Gait abnormality - Plan: Ambulatory referral to Home Health, AMB Referral VBCI Care Management  Incontinence of urine in female - Plan: Incontinence Supply Disposable MISC  Neurodegenerative cognitive impairment (HCC) -     Ambulatory referral to Home Health -     AMB Referral VBCI Care Management  Essential hypertension -     Ambulatory referral to Home  Health -     AMB Referral VBCI Care  Management  Paroxysmal atrial fibrillation (HCC) -     Ambulatory referral to Home Health -     AMB Referral VBCI Care Management  Chronic HFrEF (heart failure with reduced ejection fraction) (HCC) -     Ambulatory referral to Home Health -     AMB Referral VBCI Care Management  Chronic pain disorder -     Ambulatory referral to Home Health -     AMB Referral VBCI Care Management  Gait abnormality -     Ambulatory referral to Home Health -     AMB Referral VBCI Care Management  Incontinence of urine in female -     Incontinence Supply Disposable; Adult diapers extra large/female  Dispense: 270 Units; Refill: 2    We discussed possible serious and likely etiologies, options for evaluation and workup, limitations of telemedicine visit vs in person visit, treatment, treatment risks and precautions. The patient was advised to call back or seek an in-person evaluation if the symptoms worsen or if the condition fails to improve as anticipated. I discussed the assessment and treatment plan with the patient. The patient was provided an opportunity to ask questions and all were answered. The patient agreed with the plan and demonstrated an understanding of the instructions.  No follow-ups on file. I,Emily Lagle,acting as a Neurosurgeon for Dariyah Garduno [place], MD.,have documented all relevant documentation on the behalf of Case Vassell [place], MD,as directed by  Amylia Collazos [place], MD while in the presence of Kassim Guertin [place], MD.  *** (refresh reminder)  I, Kallin Henk [place], MD, have reviewed all documentation for this visit. The documentation on 11/02/23 for the exam, diagnosis, procedures, and orders are all accurate and complete. Emily Lagle

## 2023-11-03 ENCOUNTER — Encounter: Payer: Self-pay | Admitting: Family Medicine

## 2023-11-03 ENCOUNTER — Telehealth: Payer: Self-pay | Admitting: *Deleted

## 2023-11-03 DIAGNOSIS — G319 Degenerative disease of nervous system, unspecified: Secondary | ICD-10-CM | POA: Insufficient documentation

## 2023-11-03 NOTE — Assessment & Plan Note (Signed)
 Last DEXA in 12/2020: Right hip T-score -3.3. She has been on Fosamax  intermittently since 2017. Recommend discontinuing Fosamax . Continue fall precaution and adequate calcium  and vitamin D  supplementation.

## 2023-11-03 NOTE — Assessment & Plan Note (Signed)
 Has not been symptomatic. She is not on treatment at this time.

## 2023-11-03 NOTE — Assessment & Plan Note (Signed)
 She is on Tramadol  50 mg bid. Established with pain management but has not been seen recently due to mobility issues. We discussed some side effects and the risk of med interaction with Sertraline .

## 2023-11-03 NOTE — Assessment & Plan Note (Signed)
 Reported by caregiver as stable. Continue Sertraline  100 mg daily.

## 2023-11-03 NOTE — Assessment & Plan Note (Signed)
 CHF clinic on 07/20/23. Echo 11/2022 with LVEF 40-45% Currently on Spironolactone  25 mg daily and Losartan  25 mg daily.

## 2023-11-03 NOTE — Assessment & Plan Note (Addendum)
 BP readings at home have been at goal. Continue spironolactone  25 mg daily and Losartan  25 mg daily. Continue low-salt diet.

## 2023-11-03 NOTE — Assessment & Plan Note (Signed)
 She is on Eliquis  5 mg bid. She has not seen her cardiologist on 07/20/23.

## 2023-11-03 NOTE — Assessment & Plan Note (Addendum)
 She has not been formally Dx'ed with dementia. She is not oriented in time and per caregiver's report she has had memory difficulties for several months and gradually getting worse. We discussed possible etiologies, certainly dementia is a concern but depression also needs to be considered. Caregiver cannot take her to appts, so declined neurologist or neuropsychiatric evaluation.

## 2023-11-03 NOTE — Assessment & Plan Note (Signed)
 Not ambulatory , she is in a wheel chair. PT has not helped. Fall precautions discussed.

## 2023-11-03 NOTE — Progress Notes (Signed)
 Complex Care Management Note  Care Guide Note 11/03/2023 Name: Danielle [name] MRN: [id number] DOB: 1945/07/28  Danielle [name] is a 78 y.o. year old female who sees [place], Dickey MATSU, MD for primary care. I reached out to Lynett Pulliam-McEachean by phone today to offer complex care management services.  [name] was given information about Complex Care Management services today including:   The Complex Care Management services include support from the care team which includes your Nurse Care Manager, Clinical Social Worker, or Pharmacist.  The Complex Care Management team is here to help remove barriers to the health concerns and goals most important to you. Complex Care Management services are voluntary, and the patient may decline or stop services at any time by request to their care team member.   Complex Care Management Consent Status: Patient agreed to services and verbal consent obtained.   Follow up plan:  Telephone appointment with complex care management team member scheduled for:  11/14/2023  Encounter Outcome:  Patient Scheduled  Thedford Franks, CMA [place]  [facility], [facility] Guide Direct Dial: [phone]  Fax: [phone] Website: [email or web address]

## 2023-11-03 NOTE — Assessment & Plan Note (Signed)
 Order for supplies sent to her pharmacy.

## 2023-11-03 NOTE — Assessment & Plan Note (Signed)
 Problem has been stable. Cr 1.3-1.4 and e GFR 40's. Continue adequate hydration, low salt diet,and avoidance of NSAID's.

## 2023-11-04 ENCOUNTER — Telehealth: Payer: Self-pay

## 2023-11-04 NOTE — Telephone Encounter (Signed)
 Faxed form to [facility] for pull ups for patient.

## 2023-11-13 ENCOUNTER — Emergency Department (HOSPITAL_COMMUNITY)

## 2023-11-13 ENCOUNTER — Emergency Department (HOSPITAL_COMMUNITY)
Admission: EM | Admit: 2023-11-13 | Discharge: 2023-11-14 | Disposition: A | Discharge status: Home / Self Care | Attending: Emergency Medicine | Admitting: Emergency Medicine

## 2023-11-13 ENCOUNTER — Other Ambulatory Visit: Payer: Self-pay

## 2023-11-13 DIAGNOSIS — I4891 Unspecified atrial fibrillation: Secondary | ICD-10-CM | POA: Insufficient documentation

## 2023-11-13 DIAGNOSIS — M545 Low back pain, unspecified: Secondary | ICD-10-CM | POA: Insufficient documentation

## 2023-11-13 DIAGNOSIS — J449 Chronic obstructive pulmonary disease, unspecified: Secondary | ICD-10-CM | POA: Diagnosis not present

## 2023-11-13 DIAGNOSIS — N189 Chronic kidney disease, unspecified: Secondary | ICD-10-CM | POA: Diagnosis not present

## 2023-11-13 DIAGNOSIS — I509 Heart failure, unspecified: Secondary | ICD-10-CM | POA: Insufficient documentation

## 2023-11-13 DIAGNOSIS — Z7901 Long term (current) use of anticoagulants: Secondary | ICD-10-CM | POA: Diagnosis not present

## 2023-11-13 DIAGNOSIS — M25562 Pain in left knee: Secondary | ICD-10-CM | POA: Diagnosis not present

## 2023-11-13 DIAGNOSIS — W19XXXA Unspecified fall, initial encounter: Secondary | ICD-10-CM

## 2023-11-13 DIAGNOSIS — Z91010 Allergy to peanuts: Secondary | ICD-10-CM | POA: Insufficient documentation

## 2023-11-13 NOTE — ED Notes (Signed)
 [facility]tar called unable to give pick up time

## 2023-11-13 NOTE — ED Provider Notes (Signed)
 I provided a substantive portion of the care of this patient.  I personally made/approved the management plan for this patient and take responsibility for the patient management.  EKG Interpretation Date/Time:  Sunday November 13 2023 10:04:43 EDT Ventricular Rate:  77 PR Interval:  217 QRS Duration:  89 QT Interval:  370 QTC Calculation: 419 R Axis:   -17  Text Interpretation: Sinus rhythm Paired ventricular premature complexes Borderline prolonged PR interval Inferior infarct, old Anterolateral infarct, age indeterminate No significant change since last tracing Confirmed by Dasie Faden ([id number]) on 11/13/2023 11:52:29 AM   78 year old female here after mechanical fall.  No LOC.  No head injury.  She is on blood thinners.  X-rays are reassuring.  Will discharge home   Dasie Faden, MD 11/13/23 1220

## 2023-11-13 NOTE — ED Notes (Signed)
 C-collar removed by [employer] PA

## 2023-11-13 NOTE — ED Notes (Signed)
 [name]  [facility] family-- lives with patient. Endorses she will need PTAR home as she is unable to get patient in her car. Oncoming RN notified.

## 2023-11-13 NOTE — Discharge Instructions (Signed)
 You can take Tylenol  or ibuprofen for pain control.  I would like for you to follow-up with your primary care doctor for further evaluation.  You may return to the emergency room if any worsening symptoms.

## 2023-11-13 NOTE — ED Notes (Signed)
 Patient transported to X-ray

## 2023-11-13 NOTE — ED Provider Notes (Signed)
 [name] Provider Note   CSN: [id number] Arrival date & time: 11/13/23  9041     Patient presents with: Danielle [name] is a 78 y.o. female patient with history of atrial fibrillation on Eliquis  who presents to the emergency department today for further evaluation of a mechanical trip and fall.  Patient uses a walker at baseline.  She states that she was trying to lean down and sit on the toilet seat but she was too far away and slid down to the ground.  She is complaining of lower back pain and left knee pain.  She did not hit her head or lose consciousness.  Patient arrives in c-collar for precaution as the patient was complaining of lower back pain that radiates up the spine.  Fall       Prior to Admission medications   Medication Sig Start Date End Date Taking? Authorizing Provider  acetaminophen  (TYLENOL ) 325 MG tablet Take 2 tablets (650 mg total) by mouth every 6 (six) hours as needed for mild pain (pain score 1-3), fever or headache. 01/19/23   Danton Reyes DASEN, MD  apixaban  (ELIQUIS ) 5 MG TABS tablet Take 1 tablet (5 mg total) by mouth 2 (two) times daily. 05/04/23   Lee, [place], NP  carvedilol  (COREG ) 25 MG tablet Take 1 tablet (25 mg total) by mouth 2 (two) times daily with a meal. 08/30/22   [place]ford, Harlene HERO, FNP  hydrOXYzine  (VISTARIL ) 25 MG capsule Take 25 mg by mouth daily. 04/13/23   [provider]  Incontinence Supply Disposable MISC Adult diapers extra large/female 11/02/23   [place], [name], MD  losartan  (COZAAR ) 25 MG tablet Take 1 tablet (25 mg total) by mouth daily. 09/01/23   [place], [name], MD  methimazole  (TAPAZOLE ) 5 MG tablet Take 1 tablet (5 mg total) by mouth 3 (three) times daily. 09/27/23   [place], [name], MD  rosuvastatin  (CRESTOR ) 20 MG tablet Take 1 tablet (20 mg total) by mouth daily. Please cancel all previous orders for current medication. Change in dosage or pill size. 05/04/23    Lee, [place], NP  sertraline  (ZOLOFT ) 100 MG tablet Take 1 tablet (100 mg total) by mouth daily. 08/03/23   [place], [name], MD  spironolactone  (ALDACTONE ) 25 MG tablet Take 1 tablet (25 mg total) by mouth daily. 09/28/23 12/27/23  Lee, [place], NP  traMADol  (ULTRAM ) 50 MG tablet Take 50 mg by mouth every 8 (eight) hours as needed. 04/05/23   [provider]  trolamine salicylate (ASPERCREME) 10 % cream Apply 1 application topically as needed for muscle pain.    [provider]    Allergies: Tetanus toxoid adsorbed, Zestril  [lisinopril ], and Peanut (diagnostic)    Review of Systems  All other systems reviewed and are negative.   Updated Vital Signs BP 113/83   Pulse 81   Temp 97.9 F (36.6 C) (Oral)   Resp 16   Ht 5' 7 (1.702 m)   Wt 103.9 kg   SpO2 100%   BMI 35.88 kg/m   Physical Exam Vitals and nursing note reviewed.  Constitutional:      General: She is not in acute distress.    Appearance: Normal appearance.  HENT:     Head: Normocephalic and atraumatic.  Eyes:     General:        Right eye: No discharge.        Left eye: No discharge.  Neck:  Comments: Full range of motion in the neck.  No midline tenderness.  Cardiovascular:     Comments: Regular rate and rhythm.  S1/S2 are distinct without any evidence of murmur, rubs, or gallops.  Radial pulses are 2+ bilaterally.  Dorsalis pedis pulses are 2+ bilaterally.  No evidence of pedal edema. Pulmonary:     Comments: Clear to auscultation bilaterally.  Normal effort.  No respiratory distress.  No evidence of wheezes, rales, or rhonchi heard throughout. Chest:     Comments: Chest wall stable to palpation.  Equal chest rise Abdominal:     General: Abdomen is flat. Bowel sounds are normal. There is no distension.     Tenderness: There is no abdominal tenderness. There is no guarding or rebound.  Musculoskeletal:        General: Normal range of motion.     Cervical back: Neck supple.     Comments: Pelvis  stable to palpation.  Skin:    General: Skin is warm and dry.     Findings: No rash.  Neurological:     General: No focal deficit present.     Mental Status: She is alert.  Psychiatric:        Mood and Affect: Mood normal.        Behavior: Behavior normal.     (all labs ordered are listed, but only abnormal results are displayed) Labs Reviewed - No data to display  EKG: EKG Interpretation Date/Time:  Sunday November 13 2023 10:04:43 EDT Ventricular Rate:  77 PR Interval:  217 QRS Duration:  89 QT Interval:  370 QTC Calculation: 419 R Axis:   -17  Text Interpretation: Sinus rhythm Paired ventricular premature complexes Borderline prolonged PR interval Inferior infarct, old Anterolateral infarct, age indeterminate No significant change since last tracing Confirmed by Dasie Faden ([id number]) on 11/13/2023 11:03:38 AM  Radiology: [facility] Lumbar Spine Complete Result Date: 11/13/2023 EXAM: 4 or more VIEW(S) XRAY OF THE LUMBAR SPINE 11/13/2023 10:51:57 AM COMPARISON: CT 10/22/2023 CLINICAL HISTORY: Lower back pain s/p fall. FINDINGS: LUMBAR SPINE: BONES: No acute fracture or subluxation. Osteopenia. Signs of previous hardware fixation of the left proximal femur. DISCS AND DEGENERATIVE CHANGES: Multilevel disc space narrowing and endplate spurring within the lumbar spine is noted which is most severe at the L4-5 level. SOFT TISSUES: Vascular calcifications. IMPRESSION: 1. No acute fracture or subluxation. 2. Multilevel disc space narrowing and endplate spurring within the lumbar spine, most severe at the L4-5 level. Electronically signed by: Waddell Calk MD 11/13/2023 11:14 AM EDT RP Workstation: [id number]   DG Knee Complete 4 Views Left Result Date: 11/13/2023 CLINICAL DATA:  Fall, back pain, reportedly the patient's legs gave out. EXAM: LEFT KNEE - COMPLETE 4+ VIEW COMPARISON:  CT scan 06/23/2023 and radiographs 06/22/2023 FINDINGS: Distal femoral IM nail noted. Severe tricompartmental  osteoarthritis with spurring and loss of articular space. No discrete knee effusion or fracture identified. Femoral and popliteal artery atheromatous vascular calcifications. Bony demineralization. IMPRESSION: 1. Severe tricompartmental osteoarthritis. 2. Bony demineralization. 3. No fracture or knee effusion identified. 4. Atherosclerosis. Electronically Signed   By: Ryan Salvage M.D.   On: 11/13/2023 11:13    Procedures   Medications Ordered in the ED - No data to display  Clinical Course as of 11/13/23 1242  Sun Nov 13, 2023  1239 DG Lumbar Spine Complete No signs of acute fracture.  I do agree with radiologist interpretation. [CF]  1239 DG Knee Complete 4 Views Left Severe tricompartmental osteoarthritis.  I do agree with  radiologist interpretation.  No evidence of fracture. [CF]    Clinical Course User Index [CF] Theotis Cameron HERO, PA-C    Medical Decision Making Stephaniemarie Stoffel is a 78 y.o. female patient who presents to the emergency department today for further evaluation of mechanical fall.  Patient is C-spine cleared.  She has full range of motion and no neck pain or tenderness.  No evidence of acute fracture.  Patient's pain is under control.  Again, low suspicion for any intracranial hemorrhage she has not hit her head.  I will have her follow-up with her primary care doctor for further evaluation.  Patient stable for discharge at this time.  Vital signs normal.  Amount and/or Complexity of Data Reviewed Radiology: ordered. Decision-making details documented in ED Course.     Final diagnoses:  Fall, initial encounter    ED Discharge Orders     None          Theotis Cameron HERO DEVONNA 11/13/23 1242    Dasie Faden, MD 11/15/23 [phone]

## 2023-11-13 NOTE — ED Triage Notes (Signed)
 Patient arrives via [facility] from home for fall with lower back pain while transferring from chair to toilet. Patient endorses her legs giving out. Patient endorses pain in sacral region radiating up her back. No LOC, did not hit head, on eliquis . GCS 14. Alert and oriented x3 at baseline. Hx of dementia. Patient placed in c-collar for precaution.   Hx of afib, CHF, COPD, CKD  Ems vitals 138/98 HR 78 RR 16 O2 98 room air CBG 119

## 2023-11-14 ENCOUNTER — Telehealth: Payer: Self-pay | Admitting: Radiology

## 2023-11-14 ENCOUNTER — Telehealth: Payer: Self-pay | Admitting: *Deleted

## 2023-11-14 ENCOUNTER — Other Ambulatory Visit: Payer: Self-pay | Admitting: Licensed Clinical Social Worker

## 2023-11-14 ENCOUNTER — Ambulatory Visit: Payer: Self-pay

## 2023-11-14 ENCOUNTER — Telehealth: Payer: Self-pay

## 2023-11-14 MED ORDER — LOSARTAN POTASSIUM 50 MG PO TABS: 25.0000 mg | ORAL_TABLET | Freq: Every day | ORAL | Status: DC

## 2023-11-14 MED ORDER — ROSUVASTATIN CALCIUM 20 MG PO TABS: 20.0000 mg | ORAL_TABLET | Freq: Every day | ORAL | Status: DC

## 2023-11-14 MED ORDER — HYDROXYZINE HCL 25 MG PO TABS: 25.0000 mg | ORAL_TABLET | Freq: Every day | ORAL | Status: DC

## 2023-11-14 MED ORDER — APIXABAN 5 MG PO TABS: 5.0000 mg | ORAL_TABLET | Freq: Two times a day (BID) | ORAL | Status: DC

## 2023-11-14 MED ORDER — TRAMADOL HCL 50 MG PO TABS: 50.0000 mg | ORAL_TABLET | Freq: Three times a day (TID) | ORAL | Status: DC | PRN

## 2023-11-14 MED ORDER — CARVEDILOL 12.5 MG PO TABS: 25.0000 mg | ORAL_TABLET | Freq: Two times a day (BID) | ORAL | Status: DC

## 2023-11-14 MED ORDER — ACETAMINOPHEN 325 MG PO TABS: 650.0000 mg | ORAL_TABLET | Freq: Four times a day (QID) | ORAL | Status: DC | PRN

## 2023-11-14 MED ORDER — SERTRALINE HCL 100 MG PO TABS: 100.0000 mg | ORAL_TABLET | Freq: Every day | ORAL | Status: DC

## 2023-11-14 MED ORDER — METHIMAZOLE 5 MG PO TABS: 5.0000 mg | ORAL_TABLET | Freq: Three times a day (TID) | ORAL | Status: DC

## 2023-11-14 NOTE — Telephone Encounter (Signed)
 FYI Only or Action Required?: Action required by provider: Family requesting a nursing home referral for patient.  Patient was last seen in primary care on 11/02/2023 by [place], [name], MD.  Called Nurse Triage reporting Fall.  Symptoms began yesterday.  Interventions attempted: Nothing.  Symptoms are: gradually worsening.  Triage Disposition: See PCP Within 2 Weeks  Patient/caregiver understands and will follow disposition?: No, wishes to speak with PCP     Reason for Disposition  [1] Fall AND [2] went to emergency department for evaluation or treatment  Answer Assessment - Initial Assessment Questions Spoke with patient's Mother in law [name]  regarding symptoms. Patient had a fall yesterday and went to the ED. Patient found yesterday by Grandson lying on her back on the floor, family unsure of how she fell. This RN offered an appointment for in office or video visit and it was refused. Family is requesting a possible referral to nursing home due to patient's memory issues. Requesting a call back if possible.    1. MECHANISM: How did the fall happen?     Unsure was not with patient at time of fall.  2. ONSET: When did the fall happen? (e.g., minutes, hours, or days ago)     Yesterday  3. LOCATION: What part of the body hit the ground? (e.g., back, buttocks, head, hips, knees, hands, head, stomach)    Patient found on her bottom, may have hit her head.  4. INJURY: Did you hurt (injure) yourself when you fell? If Yes, ask: What did you injure? Tell me more about this? (e.g., body area; type of injury; pain severity)     Unsure  5. PAIN: Is there any pain? If Yes, ask: How bad is the pain? (e.g., Scale 0-10; or none, mild,      Had some head pain yesterday  6. OTHER SYMPTOMS: Do you have any other symptoms? (e.g., dizziness, fever, weakness; new-onset or worsening).      Had neck pain.  7. CAUSE: What do you think caused the fall (or falling)? (e.g., dizzy  spell, tripped)       Loss of memory  Protocols used: Falls and [facility]

## 2023-11-14 NOTE — Patient Instructions (Signed)
 Visit Information  Thank you for taking time to visit with me today. Please don't hesitate to contact me if I can be of assistance to you before our next scheduled appointment.  Our next appointment is by telephone on 12/12/2023 Please call the care guide team at [phone] if you need to cancel or reschedule your appointment.   Following is a copy of your care plan:   Goals Addressed             This Visit's Progress    VBCI Social Work Care Plan       Problems:   Level of Care Concerns:Facility placement (Memory Care)  CSW Clinical Goal(s):   Over the next 6 weeks the Patient will increase knowledge of facility placement process as evidenced by family reporting and chart review.  Interventions:  Level of Care Concerns in a patient with Dementia Current level of care: Home with other family or significant other(s): family member: brother in law  Evaluation of patient's unmet needs in current living environment ADL's Assessed needs, level of care concerns, how currently meeting needs and barriers to care Problem Solving/Task Center strategies reviewed Reviewed basic eligibility and provided education on Personal Care Service process,  Facility  Assessed needs and reviewed facility placement process; as well as the different levels of care Collaborated with identified facilities Emotional Support Provided Problem [facility] strategies reviewed Provided a list of facilities based on level of care needs  Patient Goals/Self-Care Activities:  Review educational material on Local ALF's and review facility placement process.  Plan:   No further follow up required: 10/12/2023        Please call the Suicide and Crisis Lifeline: 988 if you are experiencing a Mental Health or Behavioral Health Crisis or need someone to talk to.  Patient verbalizes understanding of instructions and care plan provided today and agrees to view in MyChart. Active MyChart status and  patient understanding of how to access instructions and care plan via MyChart confirmed with patient.     Alm Armor, LCSW [facility], [facility] Licensed Clinical Social Worker Care Coordinator [phone]

## 2023-11-14 NOTE — Patient Outreach (Signed)
 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  Visit completed with Patient  on the phone  Background:   Past Medical History:  Diagnosis Date   Anticoagulated on Coumadin     managed by cardiology   Anxiety    Chronic constipation    Chronic systolic CHF (congestive heart failure) (HCC) 2016   cardiologist--- dr end and followed by CHF clinic   CKD (chronic kidney disease), stage III (HCC)    COPD (chronic obstructive pulmonary disease) (HCC)    previous had seen pulmonology --- dr darlean, note in epic 03-14-2016, dx COPD GOLD 0   Coronary artery disease    cardiologist--- dr dayle;  last cardiac cath 01-10-20202 mild nonobstructive disease proxLAD;  per pt, had LHC prior to AVR in [place] 2016   DOE (dyspnea on exertion)    GERD (gastroesophageal reflux disease)    History of 2019 novel coronavirus disease (COVID-19) 12/19/2019   hospital admission w/ covid pneumonia , no intubatiion,  per pt symptoms resolved and back to baseline   History of cerebrovascular accident (CVA) with residual deficit    CVA in 2000 without residuals;  CVA post op AVR 01/ 2016 with residual right hand weakness   History of gastritis    History of pneumothorax    s/p right vats 11-19-2014 and  s/p left vats  12-13-2015   (both spontenaous due to bleb)   Hyperlipidemia    Hypertension    Hyperthyroidism    per pt followed by pcp,  dx approx 2014   Malignant neoplasm of upper-outer quadrant of left breast in female, 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   oncologist-- dr lanny--- dx 10/ 2018  ,  s/p left lumptectomy with node dissection;  completed radiation 04-15-2017, no chemo   Multiple thyroid  nodules    in care everywhere pt had left thyroid  nodule biospy 09-18-2014 benign   Nausea,  vomiting, and diarrhea 12/25/2022   NICM (nonischemic cardiomyopathy) (HCC) 2016   2016  ef 30%;  last echo in epic ef 45%   OA (osteoarthritis)    Osteoporosis    Paroxysmal atrial fibrillation [facility])    cardiologist--- dr end   Personal history of radiation therapy    left breast cancer  03-08-2017  to 04-05-2017   Thrombocytopenia [facility])    followed by dr lanny   Valvular stenosis, aortic 03/12/2014   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   Wears dentures    full upper    Assessment: Patient Reported Symptoms:  Cognitive Cognitive Status: Able to follow simple commands, Struggling with memory recall Cognitive/Intellectual Conditions Management [RPT]: Other Other: Neurogenerative Cognitive Impairment   Health Maintenance Behaviors: Annual physical exam Healing Pattern: Unsure  Neurological Neurological Review of Symptoms: Other: [place] Neurological Symptoms/Conditions [RPT]: Neurogenerative Cognitive Impairment Neurological Management Strategies: Medication therapy, Routine screening Neurological Self-Management Outcome: 3 (uncertain)  HEENT HEENT Symptoms Reported: No symptoms reported      Cardiovascular Cardiovascular Symptoms Reported: Fatigue, Dizziness Does patient have uncontrolled Hypertension?: No Cardiovascular Management Strategies: Medication therapy, Routine screening Cardiovascular Self-Management Outcome: 3 (uncertain)  Respiratory Respiratory Symptoms Reported: No symptoms reported    Endocrine Endocrine Symptoms Reported: No symptoms reported    Gastrointestinal Gastrointestinal Symptoms Reported: No symptoms reported      Genitourinary Genitourinary Symptoms Reported: No symptoms reported  Integumentary Integumentary Symptoms Reported: No symptoms reported    Musculoskeletal Musculoskelatal Symptoms Reviewed: Difficulty walking, Limited mobility Additional Musculoskeletal Details: Pt uses wheelchair due to  weakness Musculoskeletal Management Strategies: Activity, Medical device Musculoskeletal Self-Management Outcome: 3 (uncertain) Falls in the past year?: Yes Number of falls in past year: 2 or more Was there an injury with Fall?: Yes Fall Risk Category Calculator: 3 Patient Fall Risk Level: High Fall Risk Patient at Risk for Falls Due to: Impaired balance/gait, Impaired mobility Fall risk Follow up: Falls evaluation completed, Education provided  Psychosocial Psychosocial Symptoms Reported: No symptoms reported          There were no vitals filed for this visit.  Medications Reviewed Today   Medications were not reviewed in this encounter     Recommendation:   Continue Current Plan of Care  Follow Up Plan:   Telephone follow up appointment date/time:  10/12/2023  Alm Armor, LCSW [place], [facility] Licensed Clinical Social Worker Care Coordinator [phone]

## 2023-11-14 NOTE — Telephone Encounter (Signed)
 Copied from CRM [id number]. Topic: Clinical - Home Health Verbal Orders >> Nov 14, 2023  4:24 PM Delon DASEN wrote: Caller/Agency: Shanda with [facility] Callback Number: [phone] Service Requested: Skilled Nursing Frequency: n/a Any new concerns about the patient? Yes- possible pressure wound on right side below buttocks, stinging when it comes in contact with urine

## 2023-11-14 NOTE — Telephone Encounter (Signed)
 Copied from CRM [id number]. Topic: General - Other >> Nov 14, 2023  2:11 PM Fonda T wrote: Reason for CRM: Received call from family member of patient, [name] , states patient is mother in law, calling to report patient was seen in ER at [facility] due to a fall, and was advised to call back and report the name of doctor patient seen in the ER.  Physician name is Curtistine DASEN. Dasie, MD at [facility] in [place].   Daughter in law has concerns regarding patient having dementia and most recent fall, requesting to speak to nurse.

## 2023-11-14 NOTE — Telephone Encounter (Signed)
Noted. BJ 

## 2023-11-14 NOTE — Telephone Encounter (Signed)
 Copied from CRM #[id number]. Topic: Clinical - Medication Question >> Nov 14, 2023  9:56 AM Corin V wrote: Reason for CRM: Tylene Pounds with [facility] calling to report that patient caregiver reported a fall yesterday. Patient went to [facility] emergency room and had Xrays. Everything came back negative.

## 2023-11-15 ENCOUNTER — Telehealth: Payer: Self-pay

## 2023-11-15 NOTE — Telephone Encounter (Signed)
 Received Telephone Advice Record from [facility] who advised that Danielle  [name] at [phone] called to report that the patient fell and was admitted to the hospital.

## 2023-11-16 NOTE — Telephone Encounter (Signed)
It is okay to give verbal authorization for requested services. Thanks, BJ 

## 2023-11-16 NOTE — Telephone Encounter (Signed)
 See other encounter

## 2023-11-16 NOTE — Telephone Encounter (Signed)
 Copied from CRM [id number]. Topic: General - Other >> Nov 14, 2023  2:11 PM Fonda T wrote: Reason for CRM: Received call from family member of patient, [name] , states patient is mother in law, calling to report patient was seen in ER at [facility] due to a fall, and was advised to call back and report the name of doctor patient seen in the ER.   Physician name is Curtistine DASEN. Dasie, MD at [facility] in [place].    Daughter in law has concerns regarding patient having dementia and most recent fall, requesting to speak to nurse.

## 2023-11-16 NOTE — Telephone Encounter (Signed)
 essup, [name], CMA    11/15/23  9:40 AM Note Received Telephone Advice Record from [facility] who advised that Danielle  [name] at [phone] called to report that the patient fell and was admitted to the hospital.

## 2023-11-17 NOTE — Telephone Encounter (Signed)
 Called and left a VM for [place] with [facility], given VO per Dr. [place] for Skilled Nursing

## 2023-11-18 NOTE — Telephone Encounter (Signed)
 Form was faxed. Spoke to pt's in law [name]  Elsie. Advise her to sign a DPR when pt have appt. Offer to a f/u appt. She declined as pt has no transportation at this.

## 2023-11-18 NOTE — Telephone Encounter (Signed)
 FL-2 completed. Please attach copy of problem list and medications then fax to facility family has requested. Thanks, BJ

## 2023-11-22 ENCOUNTER — Telehealth: Payer: Self-pay

## 2023-11-22 NOTE — Telephone Encounter (Signed)
 Patient's brother informed to have patient schedule visit to discuss [facility]

## 2023-11-22 NOTE — Telephone Encounter (Signed)
 Copied from CRM #[id number]. Topic: Clinical - Medical Advice >> Nov 21, 2023  5:13 PM Harlene ORN wrote: Reason for CRM: Patient's brother called. Requesting to have PCP to call back and discuss having Home Health Care for the patient.  Please call: [phone]

## 2023-11-23 ENCOUNTER — Telehealth: Payer: Self-pay | Admitting: *Deleted

## 2023-11-23 NOTE — Telephone Encounter (Signed)
 Copied from CRM #[id number]. Topic: Clinical - Home Health Verbal Orders >> Nov 23, 2023  3:46 PM [place] J wrote: Caller/Agency: Greig Gavel / [facility] Callback Number: [phone] Service Requested: Skilled Nursing & Social Work Frequency: 1 week 2 ; 1x a week after re-certification.  Any new concerns about the patient? Yes Evaluation was completed today and she found that her power of attorney rheba) just had a heart attack. She also found that she was not able to clean her self up from her bowel movements, due to this, she found feces everywhere. There was a young man that was in the home with her is not properly cleaning her up. He stated that he is moving out by Friday and she is wanting to call either a social work or adult protective services.

## 2023-11-23 NOTE — Telephone Encounter (Signed)
It is okay to give verbal authorization for requested services. Thanks, BJ 

## 2023-11-24 ENCOUNTER — Telehealth: Payer: Self-pay | Admitting: *Deleted

## 2023-11-24 DIAGNOSIS — R32 Unspecified urinary incontinence: Secondary | ICD-10-CM

## 2023-11-24 NOTE — Telephone Encounter (Signed)
 Copied from CRM [id number]. Topic: General - Other >> Nov 24, 2023  4:03 PM Aisha D wrote: Reason for CRM: Pt's brother in law Curtistine stated that [employer] is requesting for Dr.[name] to fill out a PA for Home Care for the pt. Fax number : [phone].

## 2023-11-24 NOTE — Telephone Encounter (Signed)
 Verbal authorization given to Greig Gavel for requested services per Dr. Gib verbal order.

## 2023-11-24 NOTE — Telephone Encounter (Signed)
 Copied from CRM [id number]. Topic: Clinical - Medication Question >> Nov 24, 2023  9:15 AM Berneda FALCON wrote: Reason for CRM: Brother in Social worker, Curtistine is requesting  XL pullups for this patient. States he got size Large but they are too small and he would like XL please.

## 2023-11-24 NOTE — Telephone Encounter (Signed)
 Copied from CRM [id number]. Topic: General - Other >> Nov 24, 2023  9:14 AM Berneda FALCON wrote: Reason for CRM: Curtistine brother in law, would like a callback about getting in home care for this patient. States she is incontinent and he states he needs help to care for her, please.  Anthony's callback is [phone]

## 2023-11-25 NOTE — Telephone Encounter (Signed)
 Prescription for adult diapers as requested can be faxed to medical supplier facility. Thanks, BJ

## 2023-11-25 NOTE — Telephone Encounter (Signed)
 I believe she is already getting home health services but needs a higher level of care. Her mother-in-law was supposed to let us  know which facility she is going to be admitted, she needs nursing home care. FL 2 has been completed. She also has a Child psychotherapist assigned to her case, please verify. Thanks, BJ

## 2023-11-25 NOTE — Telephone Encounter (Signed)
 This message was already addressed on another telephone encounter. BJ

## 2023-11-28 ENCOUNTER — Telehealth: Payer: Self-pay | Admitting: Family Medicine

## 2023-11-28 NOTE — Telephone Encounter (Signed)
 Spoke with Brother. Advised him the home health referral has been sent to Greig Gavel at [facility]. Advised patient to follow up with them regarding when they will be in the home for the patient.

## 2023-11-28 NOTE — Telephone Encounter (Signed)
 Spoke with Greig Gavel. Documentation in note on 11/23/2023. Per Amy, she has placed a referral for a Child psychotherapist.

## 2023-11-28 NOTE — Telephone Encounter (Signed)
 Copied from CRM [id number]. Topic: General - Other >> Nov 28, 2023 10:26 AM [name] E wrote: Reason for CRM: Patient's brother in law, Curtistine, called in questioning if there is also a home health aide that the patient could be set up with that would assist with taking her to the bathroom and daily care tasks. Please call Curtistine if anything could potentially be set up. Callback number [phone].

## 2023-11-28 NOTE — Telephone Encounter (Signed)
 Rx sent to [facility] for Adult Diapers.

## 2023-11-29 NOTE — Telephone Encounter (Signed)
 Spoke with Curtistine and informed him of the message below.

## 2023-11-29 NOTE — Telephone Encounter (Signed)
 [facility] referral requesting requested services has ben placed. Family members are also supposed to look for a facility she can be transferred to. Her health insurance may not cover services she needs at home. Thanks, BJ

## 2023-12-01 ENCOUNTER — Telehealth: Payer: Self-pay

## 2023-12-01 NOTE — Telephone Encounter (Signed)
 Copied from CRM [id number]. Topic: General - Other >> Dec 01, 2023  1:15 PM Thersia C wrote: Reason for CRM: [facility] stated they have no face sheet, no phone number, or address and no insurance and patient is new patient with the company  Fax number [phone]

## 2023-12-03 ENCOUNTER — Other Ambulatory Visit: Payer: Self-pay | Admitting: Family Medicine

## 2023-12-05 ENCOUNTER — Telehealth: Payer: Self-pay | Admitting: *Deleted

## 2023-12-05 NOTE — Telephone Encounter (Signed)
 Copied from CRM [id number]. Topic: General - Other >> Dec 05, 2023  1:06 PM Rosina BIRCH wrote: Reason for CRM: patient mother n law called stating the patient had a fall on Friday and the EMS came out. patient is ok [phone]

## 2023-12-06 ENCOUNTER — Telehealth: Payer: Self-pay

## 2023-12-06 NOTE — Telephone Encounter (Signed)
 Copied from CRM [id number]. Topic: Clinical - Home Health Verbal Orders >> Dec 06, 2023  4:09 PM Rea BROCKS wrote: Caller/Agency: Amy, Home Health Nurse, [facility]  Callback Number: [phone] Service Requested: Nursing  Frequency: start with 1x for 4 weeks  Any new concerns about the patient? Refill for losartan  but refill was shown to be requested yesterday to [facility] on [address]. Nurse also has concerns about missing medications: Zoltidem 5mg , Promacta  25mg .  Social Worker will come out on Friday.

## 2023-12-06 NOTE — Telephone Encounter (Unsigned)
 Copied from CRM [id number]. Topic: General - Other >> Dec 06, 2023  1:11 PM Shelba HERO wrote: Reason for CRM: PA Curtistine and DPR [name]  had called in to get information regarding the referral for home health aid and if the updated referral file in September has been approved and where are the adult diapers for the patient that was called in for, because they thought that it was getting mailed ordered in. I was able to inform them that the prescription was sent in to [facility] and the referral is still pending seems like for them to find an actual health facility to send the patient too and so they wanted to know if that was nursing home or a different type of home health place, I stated to them that it seems to be a nursing home or a health place that they can take her too instead of them coming out to her home. Curtistine and [name]  also felt like the communication to them is not the greatest for they were unsure and unaware on things for the patient. They also stated that they needed another Release of Information form  for the one on file for 06/28/2023 one of the individuals that is listed has passed away, they stated that it can be sent to them by mail or be available for them to fill out at the office.

## 2023-12-07 ENCOUNTER — Other Ambulatory Visit (HOSPITAL_COMMUNITY): Payer: Self-pay

## 2023-12-07 ENCOUNTER — Other Ambulatory Visit: Payer: Self-pay | Admitting: Family Medicine

## 2023-12-07 DIAGNOSIS — G319 Degenerative disease of nervous system, unspecified: Secondary | ICD-10-CM

## 2023-12-07 DIAGNOSIS — E0590 Thyrotoxicosis, unspecified without thyrotoxic crisis or storm: Secondary | ICD-10-CM

## 2023-12-07 DIAGNOSIS — M159 Polyosteoarthritis, unspecified: Secondary | ICD-10-CM

## 2023-12-07 DIAGNOSIS — R2689 Other abnormalities of gait and mobility: Secondary | ICD-10-CM

## 2023-12-07 DIAGNOSIS — I5022 Chronic systolic (congestive) heart failure: Secondary | ICD-10-CM

## 2023-12-07 DIAGNOSIS — I480 Paroxysmal atrial fibrillation: Secondary | ICD-10-CM

## 2023-12-07 DIAGNOSIS — F331 Major depressive disorder, recurrent, moderate: Secondary | ICD-10-CM

## 2023-12-07 DIAGNOSIS — N1832 Chronic kidney disease, stage 3b: Secondary | ICD-10-CM

## 2023-12-07 MED ORDER — SPIRONOLACTONE 25 MG PO TABS: 25.0000 mg | ORAL_TABLET | Freq: Every day | ORAL | 0 refills | Status: DC

## 2023-12-07 NOTE — Telephone Encounter (Signed)
 Noted. Referral to [facility] care management placed. I think social work needs to be involved. BJ

## 2023-12-09 ENCOUNTER — Telehealth: Payer: Self-pay | Admitting: *Deleted

## 2023-12-09 ENCOUNTER — Other Ambulatory Visit: Payer: Self-pay | Admitting: Family Medicine

## 2023-12-09 DIAGNOSIS — E0590 Thyrotoxicosis, unspecified without thyrotoxic crisis or storm: Secondary | ICD-10-CM

## 2023-12-09 NOTE — Progress Notes (Signed)
 Complex Care Management Care Guide Note  12/09/2023 Name: Danielle [name] MRN: [id number] DOB: 1945/06/09  Danielle [name] is a 78 y.o. year old female who is a primary care patient of [place], Dickey MATSU, MD and is actively engaged with the care management team. I reached out to Charolette Sermons by phone today to assist with re-scheduling  with the Licensed Clinical Child psychotherapist.  Follow up plan: Successful telephone outreach attempt made. Patient declined to reschedule call with LCSW and services at this time. No further outreach attempts will be made at this time.   Harlene Satterfield  [facility]  [facility], [facility] Guide  Direct Dial: [phone]  Fax [phone]

## 2023-12-12 ENCOUNTER — Telehealth: Admitting: Licensed Clinical Social Worker

## 2023-12-13 ENCOUNTER — Other Ambulatory Visit: Payer: Self-pay | Admitting: Family Medicine

## 2023-12-13 MED ORDER — LOSARTAN POTASSIUM 25 MG PO TABS: 25.0000 mg | ORAL_TABLET | Freq: Every day | ORAL | 1 refills | Status: DC

## 2023-12-13 NOTE — Telephone Encounter (Signed)
 I sent more refills for Losartan . Monitor BP at home regularly. Promacta  is from hematologist. Zoltidem (Zolpidem ?)  is not on her medication list. Thanks, BJ

## 2023-12-14 NOTE — Telephone Encounter (Signed)
 Representative from [facility], Amy, notified of Dr. Gib response. She voiced understanding.

## 2023-12-20 ENCOUNTER — Other Ambulatory Visit: Payer: Self-pay

## 2023-12-21 ENCOUNTER — Telehealth: Payer: Self-pay | Admitting: Family Medicine

## 2023-12-21 NOTE — Telephone Encounter (Signed)
Information faxed to provided number

## 2023-12-21 NOTE — Telephone Encounter (Signed)
 Copied from CRM #[id number]. Topic: General - Other >> Dec 21, 2023  2:28 PM Drema MATSU wrote: Reason for CRM: [facility] stated that they are missing pt insurance information for intake and wants to know if it can be faxed over. Fax [phone]

## 2023-12-23 ENCOUNTER — Telehealth: Payer: Self-pay

## 2023-12-23 NOTE — Telephone Encounter (Signed)
 Copied from CRM [id number]. Topic: General - Other >> Dec 23, 2023 12:21 PM Pinkey ORN wrote: Reason for CRM: Status Follow Up >> Dec 23, 2023 12:28 PM Pinkey ORN wrote: Daughter In Rendell Flatten [phone]  Called on behalf of patient, wanting to know if there's been any update about [place], [name], MD getting documentation over to [facility] in order for patient to receive services / care.   Also mentioned that patient's husband Helga has passed away and she's wanting to know how to go about getting patient's DPR changed, because the family is trying to get care for her and with him still being listed that's not getting them anywhere as far as her care.

## 2023-12-26 NOTE — Telephone Encounter (Signed)
 Spoke with patient family member who states, patient is her mother in social worker. Advised them per office manager, the patient would need to come to office to complete a new DPR form or a copy can be mailed to the patient's home address and the patient must completed and bring to office. Per family member, advised she was unable to transport patient to office. Asked for form to be mailed and patient would complete and family member would return. Advised form will be placed in mail today. Family member voiced understanding.

## 2024-01-10 NOTE — Progress Notes (Signed)
 This encounter was created in error - please disregard.

## 2024-01-11 ENCOUNTER — Telehealth: Payer: Self-pay

## 2024-01-11 NOTE — Telephone Encounter (Signed)
 Please advise.  Copied from CRM [id number]. Topic: Clinical - Home Health Verbal Orders >> Jan 11, 2024 12:17 PM Ashley R wrote: Caller/Agency: Aimee Callback Number: discharged - [phone] - reach out to [facility]) Service Requested: Palliative Care Consult due to dementia Any new concerns about the patient? Yes

## 2024-01-11 NOTE — Telephone Encounter (Signed)
 Spoke with Amy w/ home health and she reports brother does not want to place pt in a nursing home. But she could use palliative care to get help from a child psychotherapist and nurses. Also they are trying to sign pt up for [facility]. She also stated that pts has declined and thinking that from husband passing away in 04/2023 is the reason.

## 2024-01-11 NOTE — Telephone Encounter (Signed)
 She is supposed to have a child psychotherapist working on her case. If family is interested in having palliative care evaluation, referral can be placed. Based on hx provided by family in the past,she needs 24/7 care, which has been challenging for family, as they have expressed, so she may need to NH facility. Thanks, BJ

## 2024-01-30 ENCOUNTER — Ambulatory Visit: Payer: Self-pay | Admitting: *Deleted

## 2024-01-30 NOTE — Telephone Encounter (Signed)
 FYI Only or Action Required?: Action required by provider: request for appointment and clinical question for provider.  Patient was last seen in primary care on 11/02/2023 by [name], [name], MD.  Called Nurse Triage reporting Foot Swelling.  Symptoms began several days ago.  Interventions attempted: Rest, hydration, or home remedies.  Symptoms are: unchanged.  Triage Disposition: See PCP When Office is Open (Within 3 Days)  Patient/caregiver understands and will follow disposition?: No, wishes to speak with PCP Patient's brother, Curtistine is calling- Patient gives verbal permission to speak with him. Patient is having swelling in both feet- this is new x 1 week. Patient also complains of back pain in am and has trouble moving around. Concerns about transportation becoming a problem. Care givers are unable to lift patient to get her into wheelchair for transport. They need options- EMS is only option I know- please review and call them back- not sure how to get patient to the office for appointment.   Copied from CRM #[id number]. Topic: Clinical - Red Word Triage >> Jan 30, 2024  2:07 PM Mesmerise C wrote: [facility] that prompted transfer to Nurse Triage: Patient's feet have been swollen for a week now, also states her back has been hurting as well, always has had trouble getting around >> Jan 30, 2024  2:18 PM Mesmerise C wrote: Also inquiring if can get tramadol  for the pain as well  Reason for Disposition  [1] MILD swelling of both ankles (i.e., pedal edema) AND [2] new-onset or getting worse  Answer Assessment - Initial Assessment Questions 1. ONSET: When did the swelling start? (e.g., minutes, hours, days)     1 week 2. LOCATION: What part of the leg is swollen?  Are both legs swollen or just one leg?     Both feet 3. SEVERITY: How bad is the swelling? (e.g., localized; mild, moderate, severe)     Feet only, uses slides 4. REDNESS: Is there redness or signs of infection?      no 5. PAIN: Is the swelling painful to touch? If Yes, ask: How painful is it?   (Scale 1-10; mild, moderate or severe)     no 6. FEVER: Do you have a fever? If Yes, ask: What is it, how was it measured, and when did it start?      no 7. CAUSE: What do you think is causing the leg swelling?     unsure 8. MEDICAL HISTORY: Do you have a history of blood clots (e.g., DVT), cancer, heart failure, kidney disease, or liver failure?     *No Answer* 9. RECURRENT SYMPTOM: Have you had leg swelling before? If Yes, ask: When was the last time? What happened that time?     No 10. OTHER SYMPTOMS: Do you have any other symptoms? (e.g., chest pain, difficulty breathing)       no  Protocols used: Leg Swelling and Edema-A-AH

## 2024-01-31 NOTE — Telephone Encounter (Signed)
 I believe she has hx of LE edema before. Some of her chronic comorbidities could be contributing factors, she has history of CHF and I do not think she has followed-up with cardiologist. She needs to be seen in the office. If any associated CP, shortness of breath, palpitations she needs to seek immediate medical attention. Thanks, BJ

## 2024-02-01 ENCOUNTER — Ambulatory Visit: Payer: Self-pay

## 2024-02-01 NOTE — Telephone Encounter (Signed)
 FYI Only or Action Required?: FYI only for provider: appointment scheduled on 02/02/24.  Patient was last seen in primary care on 11/02/2023 by [name], [name], MD.  Called Nurse Triage reporting No chief complaint on file..  Symptoms began a week ago.  Interventions attempted: Rest, hydration, or home remedies.  Symptoms are: unchanged.  Triage Disposition: See PCP When Office is Open (Within 3 Days)  Patient/caregiver understands and will follow disposition?: Yes   Copied from CRM #[id number]. Topic: Clinical - Red Word Triage >> Feb 01, 2024  9:52 AM Winona R wrote: Back pain, swelling in feet Pt Brother in law who is on the DPR is on the line to schedule an appointment for tomorrow if possible. Reason for Disposition  [1] MILD swelling of both ankles (i.e., pedal edema) AND [2] new-onset or getting worse  Answer Assessment - Initial Assessment Questions Requesting appointment on 02/02/24 only, due to travel issues. Scheduled acute with alternate provider for acute ankle edema and back pain.    1. ONSET: When did the swelling start? (e.g., minutes, hours, days)     Over one week  2. LOCATION: What part of the leg is swollen?  Are both legs swollen or just one leg?     Bilateral  3. SEVERITY: How bad is the swelling? (e.g., localized; mild, moderate, severe)     Bilateral feet 4. REDNESS: Is there redness or signs of infection?     denies 5. PAIN: Is the swelling painful to touch? If Yes, ask: How painful is it?   (Scale 1-10; mild, moderate or severe)     mild 6. FEVER: Do you have a fever? If Yes, ask: What is it, how was it measured, and when did it start?      denies 7. CAUSE: What do you think is causing the leg swelling?     Unsure-chronic condioon 8. MEDICAL HISTORY: Do you have a history of blood clots (e.g., DVT), cancer, heart failure, kidney disease, or liver failure?      9. RECURRENT SYMPTOM: Have you had leg swelling before? If Yes, ask:  When was the last time? What happened that time?     Yes-unsure when  10. OTHER SYMPTOMS: Do you have any other symptoms? (e.g., chest pain, difficulty breathing)       Lower back pain. Denies all other symptoms.  11. PREGNANCY: Is there any chance you are pregnant? When was your last menstrual period?  Protocols used: Leg Swelling and Edema-A-AH

## 2024-02-01 NOTE — Telephone Encounter (Signed)
 Mother in law is aware.

## 2024-02-01 NOTE — Telephone Encounter (Signed)
 Mother in law is aware.  She would like to know if we can order a home health nurse to assist with the patient?

## 2024-02-01 NOTE — Telephone Encounter (Signed)
 LE elevation, compression stockings may help, and increase mobility as tolerated. She still needs to be seen. Thanks, BJ

## 2024-02-02 ENCOUNTER — Ambulatory Visit: Admitting: Internal Medicine

## 2024-02-02 ENCOUNTER — Encounter: Payer: Self-pay | Admitting: Internal Medicine

## 2024-02-02 VITALS — BP 98/64 | HR 76 | Temp 97.7°F | Wt 226.5 lb

## 2024-02-02 DIAGNOSIS — R600 Localized edema: Secondary | ICD-10-CM | POA: Diagnosis not present

## 2024-02-02 NOTE — Progress Notes (Signed)
 Established Patient Office Visit     CC/Reason for Visit: Bilateral lower extremity edema  HPI: Danielle [name] is a 78 y.o. female who is coming in today for the above mentioned reasons. Past Medical History is significant for: Chronic systolic heart failure and chronic kidney disease.  Brought in today by brother-in-law who states that over the past week she has had increased lower extremity edema.  No shortness of breath.   Past Medical/Surgical History: Past Medical History:  Diagnosis Date   Anticoagulated on Coumadin     managed by cardiology   Anxiety    Chronic constipation    Chronic systolic CHF (congestive heart failure) (HCC) 2016   cardiologist--- dr end and followed by CHF clinic   CKD (chronic kidney disease), stage III (HCC)    COPD (chronic obstructive pulmonary disease) (HCC)    previous had seen pulmonology --- dr darlean, note in epic 03-14-2016, dx COPD GOLD 0   Coronary artery disease    cardiologist--- dr dayle;  last cardiac cath 01-10-20202 mild nonobstructive disease proxLAD;  per pt, had LHC prior to AVR in [place] 2016   DOE (dyspnea on exertion)    GERD (gastroesophageal reflux disease)    History of 2019 novel coronavirus disease (COVID-19) 12/19/2019   hospital admission w/ covid pneumonia , no intubatiion,  per pt symptoms resolved and back to baseline   History of cerebrovascular accident (CVA) with residual deficit    CVA in 2000 without residuals;  CVA post op AVR 01/ 2016 with residual right hand weakness   History of gastritis    History of pneumothorax    s/p right vats 11-19-2014 and  s/p left vats  12-13-2015   (both spontenaous due to bleb)   Hyperlipidemia    Hypertension    Hyperthyroidism    per pt followed by pcp,  dx approx 2014   Malignant neoplasm of upper-outer quadrant of left breast in female, 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   oncologist-- dr lanny--- dx 10/ 2018  ,  s/p left lumptectomy with node  dissection;  completed radiation 04-15-2017, no chemo   Multiple thyroid  nodules    in care everywhere pt had left thyroid  nodule biospy 09-18-2014 benign   Nausea, vomiting, and diarrhea 12/25/2022   NICM (nonischemic cardiomyopathy) (HCC) 2016   2016  ef 30%;  last echo in epic ef 45%   OA (osteoarthritis)    Osteoporosis    Paroxysmal atrial fibrillation [facility])    cardiologist--- dr end   Personal history of radiation therapy    left breast cancer  03-08-2017  to 04-05-2017   Thrombocytopenia    followed by dr lanny   Valvular stenosis, aortic 03/12/2014   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   Wears dentures    full upper    Past Surgical History:  Procedure Laterality Date   AORTIC VALVE REPLACEMENT  03/12/2014   [facility] in [place];  St Jude 23mm, medial Trifecta Bioprosthesis   BREAST LUMPECTOMY WITH RADIOACTIVE SEED AND SENTINEL LYMPH NODE BIOPSY Left 01/14/2017   Procedure: LEFT BREAST LUMPECTOMY WITH RADIOACTIVE SEED AND SENTINEL LYMPH NODE BIOPSY;  Surgeon: [name], MD;  [facility] OR;  Service: General;  Laterality: Left;   CARDIAC CATHETERIZATION  02/12/2014   [facility] in [place]   HYSTEROSCOPY WITH D & C N/A 11/17/2020   Procedure: DILATATION AND CURETTAGE /HYSTEROSCOPY WITH MYOSURE;  Surgeon: Cathlyn JAYSON Nikki Bobie FORBES, MD;  Location: [facility];  Service: Gynecology;  Laterality: N/A;   INTRAMEDULLARY (IM) NAIL INTERTROCHANTERIC Left 12/10/2015   Procedure: INTRAMEDULLARY (IM) NAIL INTERTROCHANTRIC;  Surgeon: Kay CHRISTELLA Cummins, MD;  Location: [facility] OR;  Service: Orthopedics;  Laterality: Left;   OPERATIVE ULTRASOUND N/A 11/17/2020   Procedure: OPERATIVE ULTRASOUND;  Surgeon: Cathlyn JAYSON Nikki Bobie FORBES, MD;  Location: [facility];  Service: Gynecology;  Laterality: N/A;   PLEURADESIS Left 12/03/2015   Procedure: PLEURADESIS;  Surgeon: Elspeth JAYSON Millers, MD;  Location[facility] OR;  Service: Thoracic;   Laterality: Left;   RESECTION OF APICAL BLEB Left 12/03/2015   Procedure: BLEBECTOMY;  Surgeon: Elspeth JAYSON Millers, MD;  Location: [facility] OR;  Service: Thoracic;  Laterality: Left;   RIGHT/LEFT HEART CATH AND CORONARY ANGIOGRAPHY N/A 03/10/2020   Procedure: RIGHT/LEFT HEART CATH AND CORONARY ANGIOGRAPHY;  Surgeon: Mady Bruckner, MD;  Location: [facility] INVASIVE CV LAB;  Service: Cardiovascular;  Laterality: N/A;   VIDEO ASSISTED THORACOSCOPY Left 12/03/2015   Procedure: VIDEO ASSISTED THORACOSCOPY;  Surgeon: Elspeth JAYSON Millers, MD;  Location: [facility] OR;  Service: Thoracic;  Laterality: Left;   VIDEO ASSISTED THORACOSCOPY (VATS) W/TALC  PLEUADESIS Right 11/19/2014   in [place]    Social History:  reports that she quit smoking about 13 years ago. Her smoking use included cigarettes. She started smoking about 23 years ago. She has a 2.5 pack-year smoking history. She has never used smokeless tobacco. She reports that she does not drink alcohol and does not use drugs.  Allergies: Allergies  Allergen Reactions   Tetanus Toxoid Adsorbed Swelling    Arm swelling   Zestril  [Lisinopril ] Cough   Peanut (Diagnostic) Cough    Family History:  Family History  Problem Relation Age of Onset   Diabetes Mother    Heart attack Mother 75   Diabetes Father    Lung cancer Father    Diabetes Sister    Thyroid  disease Sister    Diabetes Sister    HIV Brother    Colon polyps Neg Hx    Colon cancer Neg Hx      Current Outpatient Medications:    acetaminophen  (TYLENOL ) 325 MG tablet, Take 2 tablets (650 mg total) by mouth every 6 (six) hours as needed for mild pain (pain score 1-3), fever or headache., Disp: , Rfl:    apixaban  (ELIQUIS ) 5 MG TABS tablet, Take 1 tablet (5 mg total) by mouth 2 (two) times daily., Disp: 180 tablet, Rfl: 1   carvedilol  (COREG ) 25 MG tablet, Take 1 tablet (25 mg total) by mouth 2 (two) times daily with a meal., Disp: 180 tablet, Rfl: 3   hydrOXYzine  (VISTARIL ) 25 MG capsule,  Take 25 mg by mouth daily., Disp: , Rfl:    Incontinence Supply Disposable MISC, Adult diapers extra large/female, Disp: 270 Units, Rfl: 2   losartan  (COZAAR ) 25 MG tablet, Take 1 tablet (25 mg total) by mouth daily., Disp: 90 tablet, Rfl: 1   methimazole  (TAPAZOLE ) 5 MG tablet, TAKE 1 TABLET BY MOUTH 3 TIMES  DAILY, Disp: 270 tablet, Rfl: 3   rosuvastatin  (CRESTOR ) 20 MG tablet, Take 1 tablet (20 mg total) by mouth daily. Please cancel all previous orders for current medication. Change in dosage or pill size., Disp: 90 tablet, Rfl: 3   sertraline  (ZOLOFT ) 100 MG tablet, Take 1 tablet (100 mg total) by mouth daily., Disp: 90 tablet, Rfl: 1   spironolactone  (ALDACTONE ) 25 MG tablet, Take 1 tablet (25 mg total) by mouth daily., Disp:  90 tablet, Rfl: 0   traMADol  (ULTRAM ) 50 MG tablet, Take 50 mg by mouth every 8 (eight) hours as needed., Disp: , Rfl:    trolamine salicylate (ASPERCREME) 10 % cream, Apply 1 application topically as needed for muscle pain., Disp: , Rfl:   Review of Systems:  Negative unless indicated in HPI.   Physical Exam: Vitals:   02/02/24 0723  BP: 98/64  Pulse: 76  Temp: 97.7 F (36.5 C)  TempSrc: Oral  SpO2: 98%  Weight: 226 lb 8 oz (102.7 kg)    Body mass index is 35.47 kg/m.   Physical Exam Cardiovascular:     Rate and Rhythm: Normal rate and regular rhythm.     Heart sounds: Murmur heard.     Systolic murmur is present.  Musculoskeletal:     Right lower leg: No edema.     Left lower leg: No edema.      Impression and Plan:  Bilateral lower extremity edema   - No significant edema present on exam today.  Have advised lower leg elevation and compression stockings as well as follow-up with PCP.  Time spent:22 minutes reviewing chart, interviewing and examining patient and formulating plan of care.     Tully Theophilus Andrews, MD [facility]

## 2024-02-10 ENCOUNTER — Telehealth: Payer: Self-pay

## 2024-02-10 ENCOUNTER — Ambulatory Visit

## 2024-02-10 NOTE — Telephone Encounter (Signed)
 Patient canceled this visit will call to reschedule.

## 2024-02-12 ENCOUNTER — Other Ambulatory Visit: Payer: Self-pay | Admitting: Family Medicine

## 2024-02-12 ENCOUNTER — Other Ambulatory Visit (HOSPITAL_COMMUNITY): Payer: Self-pay | Admitting: Cardiology

## 2024-02-12 DIAGNOSIS — E0590 Thyrotoxicosis, unspecified without thyrotoxic crisis or storm: Secondary | ICD-10-CM

## 2024-02-13 ENCOUNTER — Telehealth (HOSPITAL_COMMUNITY): Payer: Self-pay | Admitting: Cardiology

## 2024-02-13 ENCOUNTER — Telehealth (HOSPITAL_COMMUNITY): Payer: Self-pay | Admitting: Licensed Clinical Social Worker

## 2024-02-13 NOTE — Telephone Encounter (Signed)
 H&V Care Navigation CSW Progress Note  Clinical Social Worker informed that there are barriers to patient coming to follow up appointment as she needs wheelchair and primary caregiver, pt MIL, is unable to get patient safely out of the house without a ramp.  CSW called pt MIL and requested permission to refer to local [facility] organization who will build ramps for local residents providing free labor- permission given.  CSW placed referral and they are looking into if patient will qualify for assistance with material cost as well.  CSW will assist in following up as needed.  SDOH Screenings   Food Insecurity: No Food Insecurity (11/14/2023)  Housing: Low Risk (11/14/2023)  Transportation Needs: Unmet Transportation Needs (11/14/2023)  Utilities: Not At Risk (11/14/2023)  Alcohol Screen: Low Risk (02/04/2023)  Depression (PHQ2-9): Low Risk (02/04/2023)  Financial Resource Strain: Low Risk (02/04/2023)  Physical Activity: Insufficiently Active (02/04/2023)  Social Connections: Socially Integrated (02/04/2023)  Stress: No Stress Concern Present (02/04/2023)  Tobacco Use: Medium Risk (02/02/2024)  Health Literacy: Adequate Health Literacy (02/04/2023)    Andriette HILARIO Leech, LCSW Clinical Social Worker [facility] Desk#: [phone] Cell#: [phone]

## 2024-02-14 ENCOUNTER — Emergency Department (HOSPITAL_COMMUNITY)

## 2024-02-14 ENCOUNTER — Encounter (HOSPITAL_COMMUNITY): Payer: Self-pay | Admitting: Emergency Medicine

## 2024-02-14 ENCOUNTER — Emergency Department (HOSPITAL_COMMUNITY): Admission: EM | Admit: 2024-02-14 | Discharge: 2024-02-16 | Disposition: A | Discharge status: Skilled Nursing Facility

## 2024-02-14 DIAGNOSIS — M25562 Pain in left knee: Secondary | ICD-10-CM | POA: Insufficient documentation

## 2024-02-14 DIAGNOSIS — M1711 Unilateral primary osteoarthritis, right knee: Secondary | ICD-10-CM | POA: Diagnosis not present

## 2024-02-14 DIAGNOSIS — M25561 Pain in right knee: Secondary | ICD-10-CM | POA: Diagnosis not present

## 2024-02-14 DIAGNOSIS — S0990XA Unspecified injury of head, initial encounter: Secondary | ICD-10-CM | POA: Diagnosis present

## 2024-02-14 DIAGNOSIS — R0602 Shortness of breath: Secondary | ICD-10-CM | POA: Diagnosis present

## 2024-02-14 DIAGNOSIS — Y92002 Bathroom of unspecified non-institutional (private) residence single-family (private) house as the place of occurrence of the external cause: Secondary | ICD-10-CM | POA: Insufficient documentation

## 2024-02-14 DIAGNOSIS — M545 Low back pain, unspecified: Secondary | ICD-10-CM | POA: Diagnosis not present

## 2024-02-14 DIAGNOSIS — M48061 Spinal stenosis, lumbar region without neurogenic claudication: Secondary | ICD-10-CM | POA: Diagnosis not present

## 2024-02-14 DIAGNOSIS — M1612 Unilateral primary osteoarthritis, left hip: Secondary | ICD-10-CM | POA: Diagnosis not present

## 2024-02-14 DIAGNOSIS — I700 Atherosclerosis of aorta: Secondary | ICD-10-CM | POA: Insufficient documentation

## 2024-02-14 DIAGNOSIS — E049 Nontoxic goiter, unspecified: Secondary | ICD-10-CM | POA: Insufficient documentation

## 2024-02-14 DIAGNOSIS — M51369 Other intervertebral disc degeneration, lumbar region without mention of lumbar back pain or lower extremity pain: Secondary | ICD-10-CM | POA: Diagnosis not present

## 2024-02-14 DIAGNOSIS — J90 Pleural effusion, not elsewhere classified: Secondary | ICD-10-CM | POA: Diagnosis not present

## 2024-02-14 DIAGNOSIS — K8020 Calculus of gallbladder without cholecystitis without obstruction: Secondary | ICD-10-CM | POA: Insufficient documentation

## 2024-02-14 DIAGNOSIS — Z7901 Long term (current) use of anticoagulants: Secondary | ICD-10-CM | POA: Diagnosis not present

## 2024-02-14 DIAGNOSIS — M8589 Other specified disorders of bone density and structure, multiple sites: Secondary | ICD-10-CM | POA: Diagnosis not present

## 2024-02-14 DIAGNOSIS — W182XXA Fall in (into) shower or empty bathtub, initial encounter: Secondary | ICD-10-CM | POA: Insufficient documentation

## 2024-02-14 LAB — CBC WITH DIFFERENTIAL/PLATELET
Abs Immature Granulocytes: 0.02 K/uL (ref 0.00–0.07)
Basophils Absolute: 0 K/uL (ref 0.0–0.1)
Basophils Relative: 1 %
Eosinophils Absolute: 0.1 K/uL (ref 0.0–0.5)
Eosinophils Relative: 2 %
HCT: 38.4 % (ref 36.0–46.0)
Hemoglobin: 12.6 g/dL (ref 12.0–15.0)
Immature Granulocytes: 0 %
Lymphocytes Relative: 19 %
Lymphs Abs: 1.2 K/uL (ref 0.7–4.0)
MCH: 31.4 pg (ref 26.0–34.0)
MCHC: 32.8 g/dL (ref 30.0–36.0)
MCV: 95.8 fL (ref 80.0–100.0)
Monocytes Absolute: 0.5 K/uL (ref 0.1–1.0)
Monocytes Relative: 8 %
Neutro Abs: 4.3 K/uL (ref 1.7–7.7)
Neutrophils Relative %: 70 %
Platelets: 53 K/uL — ABNORMAL LOW (ref 150–400)
RBC: 4.01 MIL/uL (ref 3.87–5.11)
RDW: 13.5 % (ref 11.5–15.5)
WBC: 6.1 K/uL (ref 4.0–10.5)
nRBC: 0 % (ref 0.0–0.2)

## 2024-02-14 LAB — BASIC METABOLIC PANEL WITH GFR
Anion gap: 10 (ref 5–15)
BUN: 20 mg/dL (ref 8–23)
CO2: 22 mmol/L (ref 22–32)
Calcium: 9.8 mg/dL (ref 8.9–10.3)
Chloride: 106 mmol/L (ref 98–111)
Creatinine, Ser: 1.24 mg/dL — ABNORMAL HIGH (ref 0.44–1.00)
GFR, Estimated: 44 mL/min — ABNORMAL LOW (ref >60)
Glucose, Bld: 86 mg/dL (ref 70–99)
Potassium: 4.4 mmol/L (ref 3.5–5.1)
Sodium: 138 mmol/L (ref 135–145)

## 2024-02-14 LAB — TROPONIN T, HIGH SENSITIVITY: Troponin T High Sensitivity: 23 ng/L — ABNORMAL HIGH (ref 0–19)

## 2024-02-14 LAB — PRO BRAIN NATRIURETIC PEPTIDE: Pro Brain Natriuretic Peptide: 834 pg/mL — ABNORMAL HIGH (ref <300.0)

## 2024-02-14 MED ORDER — OXYCODONE-ACETAMINOPHEN 5-325 MG PO TABS
1.0000 | ORAL_TABLET | Freq: Once | ORAL | Status: AC
  Administered 2024-02-15: 1 via ORAL
  Filled 2024-02-14: qty 1

## 2024-02-14 MED ORDER — OXYCODONE-ACETAMINOPHEN 5-325 MG PO TABS
1.0000 | ORAL_TABLET | Freq: Once | ORAL | Status: AC
  Administered 2024-02-14: 22:00:00 1 via ORAL
  Filled 2024-02-14: qty 1

## 2024-02-14 NOTE — ED Notes (Signed)
 When you have time, Anthony McEachean (brother) [phone] would like an update on pt.

## 2024-02-14 NOTE — ED Provider Triage Note (Signed)
 Emergency Medicine Provider Triage Evaluation Note  Danielle [name] , a 78 y.o. female  was evaluated in triage.  Pt complains of knee pain after a fall.  Briefly, the patient fell in the bathroom today after going from a seated to a standing position landing on her right knee.  No head trauma and the fall was witnessed on video monitoring.  Patient endorses pain in her right knee as well as some right-sided chest discomfort in the setting of a mechanical fall.  Review of Systems  Positive: Rib pain, knee pain Negative: Syncope  Physical Exam  BP (!) 99/56 (BP Location: Left Arm)   Pulse 75   Temp 98.2 F (36.8 C) (Oral)   Resp 18   Wt 102 kg   SpO2 98%   BMI 35.22 kg/m  Gen:   Awake, no distress Resp:  Normal effort, lungs CTAB MSK:   Moves extremities without difficulty, TTP of the right knee, right chest wall   Medical Decision Making  Medically screening exam initiated at 4:47 PM.  Appropriate orders placed.  Danielle [name] was informed that the remainder of the evaluation will be completed by another provider, this initial triage assessment does not replace that evaluation, and the importance of remaining in the ED until their evaluation is complete.  XR imaging obtained in triage. No head trauma, neck trauma or LOC.   Jerrol Agent, MD 02/14/24 [phone]

## 2024-02-14 NOTE — ED Triage Notes (Signed)
 BIB EMS from home mechanical fall in bathroom seen by family on camera that family watches her on. Landed on both knees. Did not hit head. Pt is a&o x 2 at baseline. Mental status is her baseline. Pt is on blood thinner. PT unable to walk after falling. Usually uses walker but unable to use walker to get on stretcher for EMS  EMS vitals  124/82 76 hr 94  16 rr Cbg 116

## 2024-02-14 NOTE — ED Provider Notes (Signed)
 [facility] DEPT [facility] Emergency Department Provider Note MRN:  [id number]  Arrival date & time: 02/15/2024     Chief Complaint   Fall and Knee Pain   History of Present Illness   Danielle [name] is a 78 y.o. year-old female presents to the ED with chief complaint of fall.  She is anticoagulated on Eliquis , but is not certain if she hit her head.  She complains of bilateral knee pain and low back pain.  She reports that she has had some recent shortness of breath and dyspnea with exertion.  She complains primarily of knee pain.  She states that she doesn't feel like she can walk due to the pain.  She normally walks with a walker.  She denies any pain in her hips.  Denies any other associated symptoms.  History provided by patient.   Review of Systems  Pertinent positive and negative review of systems noted in HPI.    Physical Exam   Vitals:   02/14/24 2347 02/15/24 0453  BP: 114/79 123/70  Pulse: 81 76  Resp: 20 18  Temp: 98 F (36.7 C) (!) 97.5 F (36.4 C)  SpO2: 99% 99%    CONSTITUTIONAL:  non toxic-appearing, NAD NEURO:  Alert and oriented x 3, CN 3-12 grossly intact EYES:  eyes equal and reactive ENT/NECK:  Supple, no stridor  CARDIO:  normal rate, regular rhythm, appears well-perfused  PULM:  No respiratory distress, CTAB GI/GU:  non-distended, no focal tenderness MSK/SPINE:  No gross deformities, moderate tenderness to bilateral anterior knees, mild lower extremity edema SKIN:  no rash, atraumatic   *Additional and/or pertinent findings included in MDM below  Diagnostic and Interventional Summary    EKG Interpretation Date/Time:  Tuesday February 14 2024 21:26:08 EST Ventricular Rate:  80 PR Interval:  210 QRS Duration:  80 QT Interval:  362 QTC Calculation: 417 R Axis:   -3  Text Interpretation: Sinus rhythm with 1st degree A-V block with frequent Premature ventricular complexes Inferior infarct , age undetermined Anterolateral  infarct , age undetermined Compared with prior EKG from 11/13/2023 Confirmed by Gennaro Bouchard ([id number]) on 02/15/2024 12:40:58 AM       Labs Reviewed  CBC WITH DIFFERENTIAL/PLATELET - Abnormal; Notable for the following components:      Result Value   Platelets 53 (*)    All other components within normal limits  BASIC METABOLIC PANEL WITH GFR - Abnormal; Notable for the following components:   Creatinine, Ser 1.24 (*)    GFR, Estimated 44 (*)    All other components within normal limits  PRO BRAIN NATRIURETIC PEPTIDE - Abnormal; Notable for the following components:   Pro Brain Natriuretic Peptide 834.0 (*)    All other components within normal limits  TROPONIN T, HIGH SENSITIVITY - Abnormal; Notable for the following components:   Troponin T High Sensitivity 23 (*)    All other components within normal limits  TROPONIN T, HIGH SENSITIVITY - Abnormal; Notable for the following components:   Troponin T High Sensitivity 23 (*)    All other components within normal limits    CT Lumbar Spine Wo Contrast  Final Result    CT PELVIS WO CONTRAST  Final Result    CT Head Wo Contrast  Final Result    CT Cervical Spine Wo Contrast  Final Result    DG Knee Complete 4 Views Right  Final Result    DG Pelvis 1-2 Views  Final Result    DG Chest 2 View  Final Result      Medications  oxyCODONE -acetaminophen  (PERCOCET/ROXICET) 5-325 MG per tablet 1 tablet (1 tablet Oral Given 02/14/24 2157)  oxyCODONE -acetaminophen  (PERCOCET/ROXICET) 5-325 MG per tablet 1 tablet (1 tablet Oral Given 02/15/24 0006)  oxyCODONE  (Oxy IR/ROXICODONE ) immediate release tablet 5 mg (5 mg Oral Given 02/15/24 0451)     Procedures  /  Critical Care Procedures  ED Course and Medical Decision Making  I have reviewed the triage vital signs, the nursing notes, and pertinent available records from the EMR.  Social Determinants Affecting Complexity of Care: Patient .   ED Course:    Medical Decision  Making Patient had a ground-level fall.  She uses a walker and a wheelchair.  States that she was using her walker when she fell forward landing on her knees.  X-rays show significant tricompartmental arthritis.  She has not been able to ambulate in the ED.  She is fearful about going home and falling.  I do have concerns about her going home and following.  Especially with low platelet count.  Her workup does not yield any admittable diagnoses, therefore will have patient seen by PT/OT and TOC in the morning, she may need placement in a short-term rehab facility.  Amount and/or Complexity of Data Reviewed Radiology: ordered.  Risk Prescription drug management.         Consultants:    Treatment and Plan: I considered admission due to patient's initial presentation, but after considering the examination and diagnostic results, patient will not require admission and can be discharged with outpatient follow-up.  Patient discussed with attending physician, Dr. Gennaro, who will pass patient on to the day team.  Final Clinical Impressions(s) / ED Diagnoses     ICD-10-CM   1. Fall, initial encounter  [facility]       ED Discharge Orders     None         Discharge Instructions Discussed with and Provided to Patient:   Discharge Instructions   None      Vicky Charleston, PA-C 02/15/24 0545    Gennaro Duwaine CROME, DO 02/16/24 0302

## 2024-02-15 ENCOUNTER — Other Ambulatory Visit: Payer: Self-pay

## 2024-02-15 ENCOUNTER — Emergency Department (HOSPITAL_COMMUNITY)

## 2024-02-15 ENCOUNTER — Encounter: Payer: Self-pay | Admitting: *Deleted

## 2024-02-15 LAB — TROPONIN T, HIGH SENSITIVITY: Troponin T High Sensitivity: 23 ng/L — ABNORMAL HIGH (ref 0–19)

## 2024-02-15 MED ORDER — HYDROXYZINE HCL 10 MG PO TABS
25.0000 mg | ORAL_TABLET | Freq: Every day | ORAL | Status: DC
  Administered 2024-02-15 – 2024-02-16 (×2): 25 mg via ORAL
  Filled 2024-02-15 (×2): qty 3

## 2024-02-15 MED ORDER — TRAMADOL HCL 50 MG PO TABS
50.0000 mg | ORAL_TABLET | Freq: Three times a day (TID) | ORAL | Status: DC | PRN
  Administered 2024-02-15: 18:00:00 50 mg via ORAL
  Filled 2024-02-15: qty 1

## 2024-02-15 MED ORDER — LOSARTAN POTASSIUM 50 MG PO TABS
25.0000 mg | ORAL_TABLET | Freq: Every day | ORAL | Status: DC
  Administered 2024-02-15: 18:00:00 25 mg via ORAL
  Filled 2024-02-15: qty 1

## 2024-02-15 MED ORDER — SODIUM CHLORIDE 0.9 % IV BOLUS: 1000.0000 mL | Freq: Once | INTRAVENOUS | Status: DC

## 2024-02-15 MED ORDER — ACETAMINOPHEN 325 MG PO TABS
650.0000 mg | ORAL_TABLET | Freq: Four times a day (QID) | ORAL | Status: DC | PRN
  Administered 2024-02-15: 22:00:00 650 mg via ORAL
  Filled 2024-02-15: qty 2

## 2024-02-15 MED ORDER — OXYCODONE HCL 5 MG PO TABS
5.0000 mg | ORAL_TABLET | Freq: Once | ORAL | Status: AC
  Administered 2024-02-15: 05:00:00 5 mg via ORAL
  Filled 2024-02-15: qty 1

## 2024-02-15 MED ORDER — SPIRONOLACTONE 25 MG PO TABS
25.0000 mg | ORAL_TABLET | Freq: Every day | ORAL | Status: DC
  Administered 2024-02-15 – 2024-02-16 (×2): 25 mg via ORAL
  Filled 2024-02-15 (×2): qty 1

## 2024-02-15 MED ORDER — SODIUM CHLORIDE 0.9 % IV BOLUS
500.0000 mL | Freq: Once | INTRAVENOUS | Status: AC
  Administered 2024-02-15: 21:00:00 500 mL via INTRAVENOUS

## 2024-02-15 MED ORDER — METHIMAZOLE 5 MG PO TABS
5.0000 mg | ORAL_TABLET | Freq: Three times a day (TID) | ORAL | Status: DC
  Administered 2024-02-15 – 2024-02-16 (×3): 5 mg via ORAL
  Filled 2024-02-15 (×7): qty 1

## 2024-02-15 MED ORDER — CARVEDILOL 12.5 MG PO TABS
25.0000 mg | ORAL_TABLET | Freq: Two times a day (BID) | ORAL | Status: DC
  Administered 2024-02-15: 18:00:00 25 mg via ORAL
  Filled 2024-02-15: qty 2

## 2024-02-15 MED ORDER — APIXABAN 5 MG PO TABS
5.0000 mg | ORAL_TABLET | Freq: Two times a day (BID) | ORAL | Status: DC
  Administered 2024-02-15 – 2024-02-16 (×2): 5 mg via ORAL
  Filled 2024-02-15 (×2): qty 1

## 2024-02-15 MED ORDER — SERTRALINE HCL 100 MG PO TABS
100.0000 mg | ORAL_TABLET | Freq: Every day | ORAL | Status: DC
  Administered 2024-02-15 – 2024-02-16 (×2): 100 mg via ORAL
  Filled 2024-02-15 (×2): qty 1

## 2024-02-15 MED ORDER — ROSUVASTATIN CALCIUM 5 MG PO TABS
20.0000 mg | ORAL_TABLET | Freq: Every day | ORAL | Status: DC
  Administered 2024-02-15 – 2024-02-16 (×2): 20 mg via ORAL
  Filled 2024-02-15 (×2): qty 4

## 2024-02-15 NOTE — ED Notes (Signed)
 Anthony McEachean (Brother) would like an update on patient as soon as possible [phone]

## 2024-02-15 NOTE — ED Notes (Signed)
 Assumed care of patient. Patient is alert to her name, birthday and why she is here. She does not know the date. Patient also is confused about what comes next. I explained to the patient that she is in the hospital, due to a fall and waiting to be placed at a nursing home. Patients blood pressure is low. Patient does not appear to be symptomatic of low blood pressure. Dr Pamella made aware.

## 2024-02-15 NOTE — ED Notes (Signed)
 Family called, talked with pt and updated family, talked with grandson, states that another contact is [name]  at [phone]

## 2024-02-15 NOTE — ED Notes (Signed)
 Pt placed on hospital bed

## 2024-02-15 NOTE — ED Notes (Signed)
 Patient transported to CT

## 2024-02-15 NOTE — Congregational Nurse Program (Signed)
°  Dept: [phone]   Congregational Nurse Program Note  Date of Encounter: 02/15/2024  Past Medical History: Past Medical History:  Diagnosis Date   Anticoagulated on Coumadin     managed by cardiology   Anxiety    Chronic constipation    Chronic systolic CHF (congestive heart failure) (HCC) 2016   cardiologist--- dr end and followed by CHF clinic   CKD (chronic kidney disease), stage III (HCC)    COPD (chronic obstructive pulmonary disease) (HCC)    previous had seen pulmonology --- dr darlean, note in epic 03-14-2016, dx COPD GOLD 0   Coronary artery disease    cardiologist--- dr dayle;  last cardiac cath 01-10-20202 mild nonobstructive disease proxLAD;  per pt, had LHC prior to AVR in [place] 2016   DOE (dyspnea on exertion)    GERD (gastroesophageal reflux disease)    History of 2019 novel coronavirus disease (COVID-19) 12/19/2019   hospital admission w/ covid pneumonia , no intubatiion,  per pt symptoms resolved and back to baseline   History of cerebrovascular accident (CVA) with residual deficit    CVA in 2000 without residuals;  CVA post op AVR 01/ 2016 with residual right hand weakness   History of gastritis    History of pneumothorax    s/p right vats 11-19-2014 and  s/p left vats  12-13-2015   (both spontenaous due to bleb)   Hyperlipidemia    Hypertension    Hyperthyroidism    per pt followed by pcp,  dx approx 2014   Malignant neoplasm of upper-outer quadrant of left breast in female, 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   oncologist-- dr lanny--- dx 10/ 2018  ,  s/p left lumptectomy with node dissection;  completed radiation 04-15-2017, no chemo   Multiple thyroid  nodules    in care everywhere pt had left thyroid  nodule biospy 09-18-2014 benign   Nausea, vomiting, and diarrhea 12/25/2022   NICM (nonischemic cardiomyopathy) (HCC) 2016   2016  ef 30%;  last echo in epic ef 45%   OA (osteoarthritis)    Osteoporosis    Paroxysmal atrial fibrillation [facility])     cardiologist--- dr end   Personal history of radiation therapy    left breast cancer  03-08-2017  to 04-05-2017   Thrombocytopenia    followed by dr lanny   Valvular stenosis, aortic 03/12/2014   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   Wears dentures    full upper    Encounter Details:  Danielle [name] incurred a fall at home yesterday. Her POA requested collaboration regarding her ED visit to understand the disposition. Communicated with the Dr. Jerrol in the ED for updates. Danielle [name]

## 2024-02-15 NOTE — Progress Notes (Signed)
 SNF ref faxed, awaiting bed offers.

## 2024-02-15 NOTE — ED Provider Notes (Signed)
°  Physical Exam  BP 123/70   Pulse 76   Temp (!) 97.5 F (36.4 C) (Oral)   Resp 18   Wt 102 kg   SpO2 99%   BMI 35.22 kg/m   Physical Exam  Procedures  Procedures  ED Course / MDM    Medical Decision Making Amount and/or Complexity of Data Reviewed Radiology: ordered.  Risk Prescription drug management.   Patient with knee pain.  Pending PT OT evaluation.  Possible placement.       Patsey Lot, MD 02/15/24 [phone]

## 2024-02-15 NOTE — ED Notes (Signed)
 Patient was incontinent to urine. Patient was cleaned. Soiled linens were replaced. Patient is resting in bed.

## 2024-02-15 NOTE — [place] FL2 (Signed)
 [place]  MEDICAID FL2 LEVEL OF CARE FORM     IDENTIFICATION  Patient Name: Danielle [name] Birthdate: 08-17-1945 Sex: female Admission Date (Current Location): 02/14/2024  [facility] and [place] Number:  Producer, Television/film/video and Address:  [facility], [address], [place], [place] [address]      Provider Number: [id number]  Attending Physician Name and Address:  Gennaro Duwaine CROME, DO  Relative Name and Phone Number:  Brother, Emergency Contact [phone]    Current Level of Care: Hospital Recommended Level of Care: Skilled Nursing Facility Prior Approval Number:    Date Approved/Denied:   PASRR Number: [id number]  Discharge Plan: SNF    Current Diagnoses: Patient Active Problem List   Diagnosis Date Noted   Neurodegenerative cognitive impairment 11/03/2023   Malignant neoplasm of cervix, unspecified site (HCC) 08/03/2023   Incontinence of urine in female 08/03/2023   Acute pulmonary embolism (HCC) 12/27/2022   SIRS (systemic inflammatory response syndrome) (HCC) 12/25/2022   Elevated troponin 12/25/2022   Acute kidney injury superimposed on chronic kidney disease 12/25/2022   Nausea, vomiting, and diarrhea 12/25/2022   Obesity (BMI 30-39.9) 12/25/2022   Gastroesophageal reflux disease 04/09/2022   Abdominal pain, periumbilical 04/09/2022   Chronic pain of both shoulders 12/17/2021   Depression, major, recurrent, moderate (HCC) 12/08/2020   Gait abnormality 07/31/2020   Paresthesia 07/31/2020   Low back pain with bilateral sciatica 07/31/2020   Pneumonia due to COVID-19 virus    Bacteremia due to Gram-positive bacteria 08/29/2018   Abscess    Cellulitis of left breast 08/28/2018   Cellulitis of left abdominal wall 08/28/2018   Routine general medical examination at a health care facility 04/28/2018   Stage 3b chronic kidney disease (HCC) 09/27/2017   Recurrent genital herpes 04/13/2017   Chronic pain disorder 01/25/2017    Malignant neoplasm of upper-outer quadrant of left breast in female, estrogen receptor positive (HCC) 12/16/2016   Chronic knee pain 12/07/2016   Chronic bilateral low back pain without sciatica 12/07/2016   Peripheral musculoskeletal gait disorder 12/07/2016   Encounter for therapeutic drug monitoring 06/16/2016   Generalized osteoarthritis of multiple sites 05/04/2016   Aortic valve disease 04/10/2016   S/P aortic valve replacement with bioprosthetic valve 04/10/2016   NICM (nonischemic cardiomyopathy) (HCC) 04/10/2016   Upper airway cough syndrome 03/14/2016   Class 2 severe obesity with serious comorbidity in adult 03/14/2016   COPD (chronic obstructive pulmonary disease) (HCC) 03/11/2016   Thrombocytopenia 12/12/2015   Anemia 12/12/2015   Vitamin D  deficiency 12/12/2015   Acute urinary retention 12/11/2015   Osteoporosis 12/11/2015   Hip fracture (HCC) 12/10/2015   Aortic atherosclerosis 12/10/2015   Lung blebs (HCC) 12/03/2015   Pelvic fracture (HCC) 08/19/2015   At risk for falls 08/19/2015   Essential hypertension 08/19/2015   Hyperlipidemia LDL goal <70 08/19/2015   Asthma 08/19/2015   Paroxysmal atrial fibrillation (HCC) 08/19/2015   Hyperthyroidism 08/19/2015   Chronic HFrEF (heart failure with reduced ejection fraction) (HCC)    Coronary artery disease    Aortic stenosis     Orientation RESPIRATION BLADDER Height & Weight     Self, Place  Normal Continent Weight: 224 lb 13.9 oz (102 kg) Height:     BEHAVIORAL SYMPTOMS/MOOD NEUROLOGICAL BOWEL NUTRITION STATUS      Continent Diet (see dc summary)  AMBULATORY STATUS COMMUNICATION OF NEEDS Skin   Limited Assist Verbally Normal  Personal Care Assistance Level of Assistance  Bathing, Feeding, Dressing Bathing Assistance: Limited assistance Feeding assistance: Independent Dressing Assistance: Limited assistance     Functional Limitations Info  Sight, Hearing, Speech Sight Info:  Adequate Hearing Info: Adequate Speech Info: Adequate    SPECIAL CARE FACTORS FREQUENCY  PT (By licensed PT), OT (By licensed OT)     PT Frequency: 5x/wk OT Frequency: 5x/wk            Contractures Contractures Info: Not present    Additional Factors Info  Code Status, Allergies Code Status Info: DNR Allergies Info: Tetanus Toxoid Adsorbed  Zestril  (Lisinopril )  Peanut (Diagnostic)           Current Medications (02/15/2024):  This is the current hospital active medication list No current facility-administered medications for this encounter.   Current Outpatient Medications  Medication Sig Dispense Refill   acetaminophen  (TYLENOL ) 325 MG tablet Take 2 tablets (650 mg total) by mouth every 6 (six) hours as needed for mild pain (pain score 1-3), fever or headache.     apixaban  (ELIQUIS ) 5 MG TABS tablet Take 1 tablet (5 mg total) by mouth 2 (two) times daily. 180 tablet 1   carvedilol  (COREG ) 25 MG tablet Take 1 tablet (25 mg total) by mouth 2 (two) times daily with a meal. 180 tablet 3   hydrOXYzine  (VISTARIL ) 25 MG capsule Take 25 mg by mouth daily.     Incontinence Supply Disposable MISC Adult diapers extra large/female 270 Units 2   losartan  (COZAAR ) 25 MG tablet Take 1 tablet (25 mg total) by mouth daily. 90 tablet 1   methimazole  (TAPAZOLE ) 5 MG tablet TAKE 1 TABLET BY MOUTH 3 TIMES  DAILY 270 tablet 1   rosuvastatin  (CRESTOR ) 20 MG tablet TAKE 1 TABLET BY MOUTH ONCE  DAILY 90 tablet 3   sertraline  (ZOLOFT ) 100 MG tablet Take 1 tablet (100 mg total) by mouth daily. 90 tablet 1   spironolactone  (ALDACTONE ) 25 MG tablet Take 1 tablet (25 mg total) by mouth daily. 90 tablet 0   traMADol  (ULTRAM ) 50 MG tablet Take 50 mg by mouth every 8 (eight) hours as needed.     trolamine salicylate (ASPERCREME) 10 % cream Apply 1 application topically as needed for muscle pain.       Discharge Medications: Please see discharge summary for a list of discharge medications.  Relevant  Imaging Results:  Relevant Lab Results:   Additional Information SSN [id number]  Sheri ONEIDA Sharps, LCSW

## 2024-02-15 NOTE — Evaluation (Signed)
 Physical Therapy Evaluation Patient Details Name: Danielle [name] MRN: [id number] DOB: 15-Mar-1945 Today's Date: 02/15/2024  History of Present Illness  Pt is 78 year old presented to [facility] on  02/14/24 for fall and bil knee pain. PMH - anxiety, breast cancer, COPD, CHF, CAD, HTN, and CVA with residual RUE weakness, Lt hip fx with IM nail  Clinical Impression  Pt from home with brother assisting but he works during the day. Pt fell at home when trying to get up on her own. Has been w/c bound but sounds like she is currently requiring more assist and does not feel like her brother can manage. Patient will benefit from continued inpatient follow up therapy, <3 hours/day.         If plan is discharge home, recommend the following: A lot of help with walking and/or transfers;A lot of help with bathing/dressing/bathroom;Assist for transportation;Assistance with cooking/housework   Can travel by private vehicle   No    Equipment Recommendations Chief Of Staff for Other Services       Functional Status Assessment Patient has had a recent decline in their functional status and/or demonstrates limited ability to make significant improvements in function in a reasonable and predictable amount of time     Precautions / Restrictions Precautions Precautions: Fall Restrictions Weight Bearing Restrictions Per Provider Order: No      Mobility  Bed Mobility Overal bed mobility: Needs Assistance Bed Mobility: Rolling, Supine to Sit, Sit to Supine Rolling: Contact guard assist, Used rails   Supine to sit: Max assist, HOB elevated Sit to supine: Mod assist   General bed mobility comments: Assist to move legs off of bed, elevate trunk into sitting, and bring hips to EOB. Assist to bring legs back up into bed.    Transfers Overall transfer level: Needs assistance Equipment used: Rollator (4 wheels), 1 person hand held assist Transfers: Bed to chair/wheelchair/BSC        Squat pivot transfers: Mod assist    Lateral/Scoot Transfers: Mod assist General transfer comment: Bed to tranport chair with scoot transfer with pt having difficulty finishing scoot to get both hips completely in the chair. Chair to bed with squat pivot with again pt with difficulty getting both hips all the way on the bed.    Ambulation/Gait                  Stairs            Wheelchair Mobility     Tilt Bed    Modified Rankin (Stroke Patients Only)       Balance Overall balance assessment: Needs assistance, History of Falls Sitting-balance support: Bilateral upper extremity supported, Feet unsupported, Feet supported Sitting balance-Leahy Scale: Poor Sitting balance - Comments: UE support and min assist Postural control: Posterior lean     Standing balance comment: Does not stand fully                             Pertinent Vitals/Pain Pain Assessment Pain Assessment: Faces Faces Pain Scale: Hurts even more Pain Location: back Pain Descriptors / Indicators: Grimacing, Guarding Pain Intervention(s): Limited activity within patient's tolerance, Monitored during session, Repositioned    Home Living Family/patient expects to be discharged to:: Private residence Living Arrangements: Other relatives (brother) Available Help at Discharge: Family;Available PRN/intermittently Type of Home: House Home Access: Level entry       Home Layout: One level Home Equipment: Rollator (4 wheels);Wheelchair -  manual;BSC/3in1 Additional Comments: Brother works so pt is home alone at times.    Prior Function Prior Level of Function : Needs assist       Physical Assist : Mobility (physical);ADLs (physical) Mobility (physical): Bed mobility;Transfers ADLs (physical): IADLs;Dressing Mobility Comments: Pt inconsistent with report. W/C bound and reports brother is having to assist her for transfers. Say she sometimes can get onto toilet on her own.  States she doesn't stand all the way up to get in/out of w/c. ADLs Comments: States her brother helps her sometimes with dressing. Sponge bathes. Vague about if she is able to toilet on her own at times     Extremity/Trunk Assessment   Upper Extremity Assessment Upper Extremity Assessment: Generalized weakness    Lower Extremity Assessment Lower Extremity Assessment: Generalized weakness       Communication   Communication Communication: No apparent difficulties    Cognition Arousal: Alert Behavior During Therapy: Anxious   PT - Cognitive impairments: No family/caregiver present to determine baseline, Problem solving, Sequencing, Awareness                       PT - Cognition Comments: Pt very fearful of falling. Following commands: Impaired Following commands impaired: Follows multi-step commands inconsistently, Follows multi-step commands with increased time     Cueing Cueing Techniques: Verbal cues, Tactile cues     General Comments      Exercises     Assessment/Plan    PT Assessment Patient needs continued PT services  PT Problem List Decreased strength;Decreased balance;Decreased mobility;Obesity;Pain       PT Treatment Interventions DME instruction;Functional mobility training;Therapeutic activities;Therapeutic exercise;Balance training;Patient/family education    PT Goals (Current goals can be found in the Care Plan section)  Acute Rehab PT Goals Patient Stated Goal: get stronger PT Goal Formulation: With patient Time For Goal Achievement: 02/29/24    Frequency Min 2X/week     Co-evaluation               AM-PAC PT 6 Clicks Mobility  Outcome Measure Help needed turning from your back to your side while in a flat bed without using bedrails?: A Little Help needed moving from lying on your back to sitting on the side of a flat bed without using bedrails?: A Lot Help needed moving to and from a bed to a chair (including a  wheelchair)?: A Lot Help needed standing up from a chair using your arms (e.g., wheelchair or bedside chair)?: Total Help needed to walk in hospital room?: Total Help needed climbing 3-5 steps with a railing? : Total 6 Click Score: 10    End of Session Equipment Utilized During Treatment: Gait belt Activity Tolerance: Patient tolerated treatment well Patient left: in bed;with call bell/phone within reach;with bed alarm set   PT Visit Diagnosis: Other abnormalities of gait and mobility (R26.89);Muscle weakness (generalized) (M62.81);History of falling (Z91.81);Difficulty in walking, not elsewhere classified (R26.2);Pain Pain - part of body:  (back)    Time: 8888-8863 PT Time Calculation (min) (ACUTE ONLY): 25 min   Charges:   PT Evaluation $PT Eval Moderate Complexity: 1 Mod PT Treatments $Therapeutic Activity: 8-22 mins PT General Charges $$ ACUTE PT VISIT: 1 Visit         [facility] PT Acute Rehabilitation Services Office [phone]   Rodgers ORN [facility] 02/15/2024, 11:59 AM

## 2024-02-15 NOTE — Progress Notes (Addendum)
 CSW consult received. Patient is a 78 yo f who presents to the ED s/p fall at home. From home with BIL who is significant support, though he reportedly works during the day. Patient kept in ED for PT evaluation, recommending SNF. Referrals have been launched. Bed offers pending.   CSW met with patient at bedside to discuss disposition planning. Education on SNF process provided. Later, spoke with patient's BIL Curtistine [phone]). Both patient and Curtistine state a previous SNF admission to [facility] in [place] '24 was a positive experience and this would be their first choice for SNF at this time. Called and spoke with admissions at [facility], requested referral review. Awaiting response, as well as offers from other facilities. CSW following.   7:24 - Patient/family's first SNF preference, [facility], has not not updated availability beyond considering status, even after call with admissions department. Attempted to review accepting SNF facilities ([facility], [place], [place]) with BIL Curtistine. Per previous discussion, family would prefer facility in [place]. Call went unanswered. VMM left with request to call back. CSW following for SNF preferences.

## 2024-02-15 NOTE — ED Notes (Signed)
 Attempted to ambulate pt. PO medication provided prior to attempt see MAR. Pt reports she has been using a wheelchair. Has needed help with mobility provided by her brother. Reports that at this time of her fall she was trying to use a walker to ambulate to prepare some food and go to the bathroom. During ambulation attempt, pt reports pain all over. Was unable to lift legs up off the bed and onto the floor d/t excessive pain. Unable to complete ambulation. Findings reports to PA. Further imaging ordered.

## 2024-02-15 NOTE — ED Notes (Signed)
 I set pt's dinner tray up for her.

## 2024-02-16 NOTE — Discharge Instructions (Signed)
 Be sure to follow-up at your rehabilitation facility.  Return here for concerning changes in your condition.

## 2024-02-16 NOTE — Progress Notes (Addendum)
 CSW spoke w/ pt brother Danielle [name] [phone]. Bed choice is [facility]. Ins auth pending ID [id number].  When searching for auth in [place], pt is found by searching Danielle [name], G (no hyphen & first initial only).  Update 10am- Auth approved. MD and RN notified to prep for dc

## 2024-02-16 NOTE — ED Provider Notes (Signed)
 Patient accepted to [facility] rehab.   Garrick Charleston, MD 02/16/24 1200

## 2024-02-16 NOTE — Progress Notes (Signed)
 Pt ready to dc to [facility]. Call report [phone] room 202A.

## 2024-02-16 NOTE — ED Provider Notes (Signed)
 Emergency Medicine Observation Re-evaluation Note  Danielle [name] is a 78 y.o. female, seen on rounds today.  Pt initially presented to the ED for complaints of Fall and Knee Pain Currently, the patient is resting comfortably.  Physical Exam  BP 101/71   Pulse 76   Temp (!) 97.4 F (36.3 C) (Oral)   Resp 18   Wt 102 kg   SpO2 98%   BMI 35.22 kg/m  Physical Exam General: Elderly female in no distress  ED Course / MDM  EKG:EKG Interpretation Date/Time:  Tuesday February 14 2024 21:26:08 EST Ventricular Rate:  80 PR Interval:  210 QRS Duration:  80 QT Interval:  362 QTC Calculation: 417 R Axis:   -3  Text Interpretation: Sinus rhythm with 1st degree A-V block with frequent Premature ventricular complexes Inferior infarct , age undetermined Anterolateral infarct , age undetermined Compared with prior EKG from 11/13/2023 Confirmed by Gennaro Bouchard ([id number]) on 02/15/2024 12:40:58 AM  I have reviewed the labs performed to date as well as medications administered while in observation.  Recent changes in the last 24 hours include evaluation by social work, authorization for placement at the preferred location by family is pending.  Plan  Current plan is for placement.    Garrick Charleston, MD 02/16/24 [phone]

## 2024-02-16 NOTE — ED Notes (Signed)
 Report called to to [facility] to [employer]

## 2024-03-07 ENCOUNTER — Inpatient Hospital Stay: Admitting: Family Medicine

## 2024-03-07 ENCOUNTER — Other Ambulatory Visit (HOSPITAL_COMMUNITY): Payer: Self-pay | Admitting: Cardiology

## 2024-03-13 ENCOUNTER — Emergency Department (HOSPITAL_COMMUNITY)

## 2024-03-13 ENCOUNTER — Encounter (HOSPITAL_COMMUNITY): Payer: Self-pay

## 2024-03-13 ENCOUNTER — Other Ambulatory Visit: Payer: Self-pay

## 2024-03-13 ENCOUNTER — Emergency Department (HOSPITAL_COMMUNITY)
Admission: EM | Admit: 2024-03-13 | Discharge: 2024-03-13 | Disposition: A | Discharge status: Home / Self Care | Attending: Emergency Medicine | Admitting: Emergency Medicine

## 2024-03-13 DIAGNOSIS — Z7901 Long term (current) use of anticoagulants: Secondary | ICD-10-CM | POA: Insufficient documentation

## 2024-03-13 DIAGNOSIS — M25561 Pain in right knee: Secondary | ICD-10-CM | POA: Insufficient documentation

## 2024-03-13 DIAGNOSIS — W19XXXA Unspecified fall, initial encounter: Secondary | ICD-10-CM

## 2024-03-13 DIAGNOSIS — M25551 Pain in right hip: Secondary | ICD-10-CM | POA: Insufficient documentation

## 2024-03-13 DIAGNOSIS — M545 Low back pain, unspecified: Secondary | ICD-10-CM | POA: Insufficient documentation

## 2024-03-13 DIAGNOSIS — M25461 Effusion, right knee: Secondary | ICD-10-CM | POA: Insufficient documentation

## 2024-03-13 DIAGNOSIS — W1839XA Other fall on same level, initial encounter: Secondary | ICD-10-CM | POA: Insufficient documentation

## 2024-03-13 DIAGNOSIS — R9389 Abnormal findings on diagnostic imaging of other specified body structures: Secondary | ICD-10-CM

## 2024-03-13 LAB — BASIC METABOLIC PANEL WITH GFR
Anion gap: 11 (ref 5–15)
BUN: 13 mg/dL (ref 8–23)
CO2: 21 mmol/L — ABNORMAL LOW (ref 22–32)
Calcium: 9.4 mg/dL (ref 8.9–10.3)
Chloride: 106 mmol/L (ref 98–111)
Creatinine, Ser: 1.3 mg/dL — ABNORMAL HIGH (ref 0.44–1.00)
GFR, Estimated: 42 mL/min — ABNORMAL LOW
Glucose, Bld: 103 mg/dL — ABNORMAL HIGH (ref 70–99)
Potassium: 4.1 mmol/L (ref 3.5–5.1)
Sodium: 138 mmol/L (ref 135–145)

## 2024-03-13 LAB — CBC WITH DIFFERENTIAL/PLATELET
Abs Immature Granulocytes: 0.04 K/uL (ref 0.00–0.07)
Basophils Absolute: 0 K/uL (ref 0.0–0.1)
Basophils Relative: 0 %
Eosinophils Absolute: 0.1 K/uL (ref 0.0–0.5)
Eosinophils Relative: 1 %
HCT: 33.2 % — ABNORMAL LOW (ref 36.0–46.0)
Hemoglobin: 10.8 g/dL — ABNORMAL LOW (ref 12.0–15.0)
Immature Granulocytes: 0 %
Lymphocytes Relative: 10 %
Lymphs Abs: 0.9 K/uL (ref 0.7–4.0)
MCH: 30.7 pg (ref 26.0–34.0)
MCHC: 32.5 g/dL (ref 30.0–36.0)
MCV: 94.3 fL (ref 80.0–100.0)
Monocytes Absolute: 0.8 K/uL (ref 0.1–1.0)
Monocytes Relative: 8 %
Neutro Abs: 7.6 K/uL (ref 1.7–7.7)
Neutrophils Relative %: 81 %
Platelets: 37 K/uL — ABNORMAL LOW (ref 150–400)
RBC: 3.52 MIL/uL — ABNORMAL LOW (ref 3.87–5.11)
RDW: 13.7 % (ref 11.5–15.5)
WBC: 9.4 K/uL (ref 4.0–10.5)
nRBC: 0 % (ref 0.0–0.2)

## 2024-03-13 LAB — TROPONIN T, HIGH SENSITIVITY: Troponin T High Sensitivity: 22 ng/L — ABNORMAL HIGH (ref 0–19)

## 2024-03-13 MED ORDER — MORPHINE SULFATE (PF) 4 MG/ML IV SOLN
4.0000 mg | Freq: Once | INTRAVENOUS | Status: AC
  Administered 2024-03-13: 4 mg via INTRAVENOUS
  Filled 2024-03-13: qty 1

## 2024-03-13 MED ORDER — LACTATED RINGERS IV BOLUS: 500.0000 mL | Freq: Once | INTRAVENOUS | Status: DC

## 2024-03-13 MED ORDER — IPRATROPIUM-ALBUTEROL 0.5-2.5 (3) MG/3ML IN SOLN
3.0000 mL | Freq: Once | RESPIRATORY_TRACT | Status: AC
  Administered 2024-03-13: 3 mL via RESPIRATORY_TRACT
  Filled 2024-03-13: qty 3

## 2024-03-13 MED ORDER — SODIUM CHLORIDE 0.9 % IV BOLUS
500.0000 mL | Freq: Once | INTRAVENOUS | Status: AC
  Administered 2024-03-13: 500 mL via INTRAVENOUS

## 2024-03-13 MED ORDER — ACETAMINOPHEN 500 MG PO TABS
1000.0000 mg | ORAL_TABLET | Freq: Once | ORAL | Status: AC
  Administered 2024-03-13: 1000 mg via ORAL
  Filled 2024-03-13: qty 2

## 2024-03-13 NOTE — Progress Notes (Signed)
" °   03/13/24 1834  Spiritual Encounters  Type of Visit Initial  Care provided to: Patient  Conversation partners present during encounter Other (comment)  Reason for visit Routine spiritual support  OnCall Visit Yes   Responded to patient's request for spiritual care.  "

## 2024-03-13 NOTE — ED Triage Notes (Signed)
 Pt bib ems from home; c/o R knee pain; yesterday pt trying to get out of tub, sat in bottom, knee jammed against door, stuck that way for 1 hour; ems got her up and out of tub into bed; no complaints then but today knee is painful and swollen; pt also has hx asthma, used inhaler; wheezing noted, 5mg  albuterol  given PTA, wheezing cleared; initial sat 90% RA; 92% after treatment RA, placed on 2L, 97%; 20 ga RH, 200 mcg fentanyl  given pta; bp 120/64, cbg 130, T 98.65F, rr 18, capnography 30, HR 94; per caregiver, pt mentating at baseline, oriented to situation, ems repots hx dementia

## 2024-03-13 NOTE — ED Provider Notes (Addendum)
" °  Physical Exam   Vitals:   03/13/24 1339 03/13/24 1358 03/13/24 1418  BP: 114/70  (!) 101/50  Pulse: 86    Resp: 16    Temp: 99.8 F (37.7 C) 98.2 F (36.8 C)   TempSrc: Oral Rectal   SpO2: 97%       Physical Exam  Procedures  Procedures  ED Course / MDM    Medical Decision Making Amount and/or Complexity of Data Reviewed Labs: ordered. Radiology: ordered. ECG/medicine tests: ordered.  Risk OTC drugs. Prescription drug management.   Patient received at shift change from prior EDP [facility] PA-C, see their note for initial history/physical exam findings/lab and imaging results/initial assessment and plan.  Patient experienced a fall at home yesterday while getting out of the shower, this was witnessed by her home health CNA who believes that she missed stepped within the door of the impact occurring to her right knee, patient is accompanied by a family member today who did not witness the fall and is unable to provide additional context regarding whether or not she hit her head.  Patient is complaining of right hip and knee pain as well as right sided low back pain today. Dementia, poor historian.   Pertinent labs: - CBC: Hemoglobin of 10.8 is down from most recent baseline of 12.6 from 4 weeks prior - BMP: CR of 1.3 is stable compared to baseline - Troponin 22, consistent with baseline, no active chest pain so will not obtain repeat   Imaging results: - knee XR: 1. No acute fracture or dislocation. 2. 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 - CXR: 1. No acute cardiopulmonary process. 2. Unchanged, nonspecific BILATERAL increased interstitial opacities. - CT head: 1. Stable head CT, no acute intracranial process. - CT C-spine: 1. No acute cervical spine fracture. - CT L-spine: 1. No acute lumbar spine fracture. 2. Stable multilevel lumbar spondylosis and facet hypertrophy. 3.  Aortic Atherosclerosis (ICD10-I70.0). - CT pelvis: 1. No  acute osseous pathology. 2. Status post ORIF of the left femoral neck fracture. 3. Thickened appearance of the endometrium. Further evaluation with ultrasound on a nonemergent/outpatient basis recommended. 4.  Aortic Atherosclerosis (ICD10-I70.0).   I discussed these results with the patient at the bedside, her brother was not available therefore I called him to discuss these results as well, he is concerned regarding his ability to transport the patient on his own as he does not feel that he can move her, I advised that at this point there is no reason she cannot be partially weightbearing just for transport purposes, typically at home she transports from bed to wheelchair by standing and pivoting for a brief moment.  For the swelling of the knee I recommend that he continue to ice/elevate/wrap the affected area.  He voiced understanding and is in agreement with this plan. Return precautions discussed, patient is appropriate for discharge at this time.   At 8:17pm I was informed by nursing staff that patient was endorsing wheezing, patient has history of COPD and is not on oxygen , sats at 100% on RA. Diffuse wheezing noted on my exam. Will reassess after DuoNeb. Continues to complain of knee pain, will administer Tylenol .  Significant symptomatic improvement noted after DuoNeb, wheezing no longer appreciated on auscultation. Will contact [facility] again in regard to transfer.      Danielle [name], [place] 03/13/24 2153    Danielle Donnice PARAS, MD 03/13/24 2313  "

## 2024-03-13 NOTE — Discharge Instructions (Addendum)
 Your CT scans and x-rays today do not show any evidence of a fracture or dislocation as a result of your fall.  I recommend that you continue to ice/wrap/elevate your affected knee to help alleviate the swelling, you may continue Tylenol  for pain. The CT scan of your pelvis shows some thickening of your endometrium, please discuss this with your primary care provider as you may benefit from an ultrasound to assess this further. Follow-up with your primary care provider.  Return to the emergency department if your symptoms worsen.

## 2024-03-13 NOTE — ED Notes (Signed)
 Ptar called

## 2024-03-13 NOTE — ED Notes (Signed)
 PA re-eval pt and reports pt ready for d/c now. PTAR called back.

## 2024-03-13 NOTE — ED Notes (Signed)
 Pt noted to have audible wheezing and short of breath. PA made aware and re-eval pt.

## 2024-03-13 NOTE — ED Provider Notes (Signed)
 " [facility] Provider Note   CSN: [id number] Arrival date & time: 03/13/24  1329     Patient presents with: No chief complaint on file.   Danielle [name] is a 79 y.o. female.  The history is provided by the patient and a relative. The history is limited by the condition of the patient.   Patient is a 79 year old female to the ED today concerns for right knee pain and right hip pain/right-sided back pain that has been present ever since this morning, noted to have recently taken a fall yesterday while trying to get out of the shower, noted to have misstepped and landed on her right knee.  Uncertain if head was injured.  Spoke with brother who is caretaker, who noted that the home CNA was with her at time of the fall and was uncertain if she hit her head.  But did note that she had a mechanical fall, not having any chest pain or shortness of breath.  Noted to have been fine after incident but noted to have had increased pain upon awakening this morning, complaining of right knee pain.  Denies fever, headache, vision changes, chest pain, shortness of breath, cough, congestion, abdominal pain, numbness, tingling, dysuria, hematuria, melena, medic easier, diarrhea, rashes.    Prior to Admission medications  Medication Sig Start Date End Date Taking? Authorizing Provider  acetaminophen  (TYLENOL ) 325 MG tablet Take 2 tablets (650 mg total) by mouth every 6 (six) hours as needed for mild pain (pain score 1-3), fever or headache. 01/19/23   Danton Reyes DASEN, MD  albuterol  (VENTOLIN  HFA) 108 (90 Base) MCG/ACT inhaler Inhale 2 puffs into the lungs every 4 (four) hours as needed for shortness of breath. 12/12/23   [provider]  alendronate  (FOSAMAX ) 70 MG tablet Take 70 mg by mouth once a week. Take with a full glass of water on an empty stomach.    [provider]  apixaban  (ELIQUIS ) 5 MG TABS tablet Take 1 tablet (5 mg  total) by mouth 2 (two) times daily. 05/04/23   Lee, [name], NP  carvedilol  (COREG ) 25 MG tablet Take 1 tablet (25 mg total) by mouth 2 (two) times daily with a meal. 08/30/22   Glena Harlene HERO, FNP  Incontinence Supply Disposable MISC Adult diapers extra large/female 11/02/23   [name], [name], MD  losartan  (COZAAR ) 25 MG tablet Take 1 tablet (25 mg total) by mouth daily. 12/13/23   [name], [name], MD  melatonin 5 MG TABS Take 5 mg by mouth at bedtime.    [provider]  methimazole  (TAPAZOLE ) 5 MG tablet TAKE 1 TABLET BY MOUTH 3 TIMES  DAILY 02/13/24   [name], [name], MD  omeprazole  (PRILOSEC) 40 MG capsule Take 40 mg by mouth daily. 12/06/23   [provider]  rosuvastatin  (CRESTOR ) 20 MG tablet TAKE 1 TABLET BY MOUTH ONCE  DAILY 02/13/24   Clegg, Amy D, NP  sertraline  (ZOLOFT ) 50 MG tablet Take 50 mg by mouth daily. 01/21/24   [provider]  spironolactone  (ALDACTONE ) 25 MG tablet TAKE 1 TABLET BY MOUTH DAILY 03/07/24   Lee, [name], NP  trolamine salicylate (ASPERCREME) 10 % cream Apply 1 application topically as needed for muscle pain.    [provider]    Allergies: Tetanus toxoid adsorbed, Zestril  [lisinopril ], and Peanut (diagnostic)    Review of Systems  Musculoskeletal:  Positive for arthralgias and back pain.  All other systems reviewed and are  negative.   Updated Vital Signs BP (!) 101/50   Pulse 86   Temp 98.2 F (36.8 C) (Rectal)   Resp 16   SpO2 97%   Physical Exam Vitals and nursing note reviewed.  Constitutional:      General: She is not in acute distress.    Appearance: Normal appearance. She is not ill-appearing or diaphoretic.  HENT:     Head: Normocephalic and atraumatic.  Eyes:     General: No scleral icterus.       Right eye: No discharge.        Left eye: No discharge.     Extraocular Movements: Extraocular movements intact.     Conjunctiva/sclera: Conjunctivae normal.  Neck:     Comments: No tenderness to palpation  over cervical spine. Cardiovascular:     Rate and Rhythm: Normal rate and regular rhythm.     Pulses: Normal pulses.     Heart sounds: Normal heart sounds. No murmur heard.    No friction rub. No gallop.  Pulmonary:     Effort: Pulmonary effort is normal. No respiratory distress.     Breath sounds: No stridor. No wheezing, rhonchi or rales.  Chest:     Chest wall: No tenderness.  Abdominal:     General: Abdomen is flat. There is no distension.     Palpations: Abdomen is soft.     Tenderness: There is no abdominal tenderness. There is no right CVA tenderness, left CVA tenderness, guarding or rebound.  Musculoskeletal:        General: Swelling and tenderness present. No deformity.     Cervical back: Normal range of motion. No rigidity or tenderness.     Left lower leg: No edema.     Comments: Notable does have tenderness to anterior aspect of right knee both to inferior and superior portions of patella.  Additionally noting to pain to right lateral hip.  Noted to have swelling around right knee.  Able to move toes.  No signs of erythema.  Noted to have pain to right sided low back without any midline pain.  Skin:    General: Skin is warm and dry.     Findings: No bruising, erythema or lesion.  Neurological:     Mental Status: She is alert. Mental status is at baseline.     Sensory: No sensory deficit.     Motor: No weakness.     Comments: Alert and oriented x 3 to person, place, event.  Unable to provide correct time.  At baseline.  Notably he is able to move all extremities grossly, with good grip strength bilaterally and good sensation to both upper and lower extremities.  Unable to test strength in right leg secondary to pain at knee and hip.  Has pain with both passive and active range of motion.  Psychiatric:        Mood and Affect: Mood normal.     (all labs ordered are listed, but only abnormal results are displayed) Labs Reviewed  CBC WITH DIFFERENTIAL/PLATELET  BASIC  METABOLIC PANEL WITH GFR  TROPONIN T, HIGH SENSITIVITY    EKG: EKG Interpretation Date/Time:  Tuesday March 13 2024 14:16:16 EST Ventricular Rate:  88 PR Interval:  190 QRS Duration:  88 QT Interval:  356 QTC Calculation: 430 R Axis:   1  Text Interpretation: Sinus rhythm with occasional Premature ventricular complexes and Premature atrial complexes Possible Lateral infarct , age undetermined Inferior infarct , age undetermined Abnormal ECG Confirmed by Garrick,  Lamar [phone]) on 03/13/2024 2:18:23 PM  Radiology: DG Knee Complete 4 Views Right Result Date: 03/13/2024 CLINICAL DATA:  RIGHT knee pain status post mechanical fall. EXAM: RIGHT KNEE - COMPLETE 4+ VIEW COMPARISON:  02/14/2024 FINDINGS: Diffuse osteopenia. Advanced tricompartmental osteoarthrosis again seen. Minimal knee joint effusion. Atherosclerotic changes seen throughout visualized arterial segments. No acute fracture or dislocation. IMPRESSION: 1. No acute fracture or dislocation. 2. 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 Electronically Signed   By: Aliene Lloyd M.D.   On: 03/13/2024 15:12    Procedures   Medications Ordered in the ED  lactated ringers  bolus 500 mL (500 mLs Intravenous Not Given 03/13/24 1426)  morphine  (PF) 4 MG/ML injection 4 mg (4 mg Intravenous Given 03/13/24 1417)  sodium chloride  0.9 % bolus 500 mL (500 mLs Intravenous New Bag/Given 03/13/24 1427)    Medical Decision Making  This patient is a 79 year old female who presents to the ED for concern of right knee pain and right low back pain that presented after mechanical fall yesterday, spoke with caretaker who noted that she had fallen while tripping out of the shower, noted to be observed by CNA who noted that she did not have any acute injuries afterward but woke up today with extreme pain.  On physical exam, patient is in no acute distress, afebrile, alert and orient x 4, speaking in full sentences, nontachypneic,  nontachycardic.  Notable does have some right knee swelling and tenderness to palpation as well as right lateral hip tenderness palpation.  Does have good pulses.  But unable to assess range of motion secondary to pain.  Will obtain lab work as well as x-rays of knee and CTs of head and cervical spine considering unknown status of whether or not she hit her head.  Additionally with pelvis and lumbar spine.  Pending further lab work and evaluations, patient care transferred to Beverley Hamilton, PA-C  Differential diagnoses prior to evaluation: The emergent differential diagnosis includes, but is not limited to, fracture, ligamentous injury, neurovascular injury, dislocation, malalignment, CVA, dehydration, infection, metabolic disturbance, septic joint, bursitis, which is injury. This is not an exhaustive differential.   Past Medical History / Co-morbidities / Social History: Pelvic fracture, HFrEF, aortic stenosis, COPD, CKD, HTN, cardiomyopathy, paroxysmal A-fib on Eliquis .  Additional history: Chart reviewed. Pertinent results include: No gentleman seen on 02/13/2024 for fall  Lab Tests/Imaging studies: I personally interpreted labs/imaging and the pertinent results include: CBC, BMP, troponin pending, x-rays of chest, knee, CT lumbar, head, cervical spine and pelvis pending.    Cardiac monitoring: EKG obtained and interpreted by myself and attending physician which shows:   EKG Interpretation Date/Time:  Tuesday March 13 2024 14:16:16 EST Ventricular Rate:  88 PR Interval:  190 QRS Duration:  88 QT Interval:  356 QTC Calculation: 430 R Axis:   1  Text Interpretation: Sinus rhythm with occasional Premature ventricular complexes and Premature atrial complexes Possible Lateral infarct , age undetermined Inferior infarct , age undetermined Abnormal ECG Confirmed by Garrick Lamar [phone]) on 03/13/2024 2:18:23 PM          Medications: I ordered medication including morphine , NS.  I  have reviewed the patients home medicines and have made adjustments as needed.  Critical Interventions: None  Social Determinants of Health: Lives at home with brother and caretaker  Disposition: 3:17 PM Care of Danielle [name] transferred to [facility] and Dr. Cottie at the end of my shift as the patient will require reassessment once labs/imaging  have resulted. Patient presentation, ED course, and plan of care discussed with review of all pertinent labs and imaging. Please see his/her note for further details regarding further ED course and disposition. Plan at time of handoff is evaluates labs and imaging, dissipate likely discharge if all negative. This may be altered or completely changed at the discretion of the oncoming team pending results of further workup.   Final diagnoses:  Fall, initial encounter  Acute pain of right knee  Acute right-sided low back pain, unspecified whether sciatica present    ED Discharge Orders     None          Beola Terrall RAMAN, [place] 03/13/24 1517  "

## 2024-03-14 ENCOUNTER — Telehealth: Payer: Self-pay | Admitting: *Deleted

## 2024-03-14 NOTE — Telephone Encounter (Signed)
 Patients daughter in law has been informed and voiced understanding.

## 2024-03-14 NOTE — Telephone Encounter (Signed)
 As I remember, she has been prescribed tramadol  at pain management, she may be overdue for follow-up, so she needs to arrange an appointment with provider. She can take Tylenol  500 mg 3-4 times per day. Thanks, BJ

## 2024-03-14 NOTE — Telephone Encounter (Signed)
 Copied from CRM [id number]. Topic: Clinical - Medical Advice >> Mar 14, 2024  2:57 PM Chasity T wrote: Reason for CRM: [place]  is wondering if patient can get medication to help with pain sent to pharmacy due to her recent er visit.

## 2024-03-15 ENCOUNTER — Encounter (HOSPITAL_COMMUNITY): Payer: Self-pay | Admitting: Internal Medicine

## 2024-03-15 ENCOUNTER — Emergency Department (HOSPITAL_COMMUNITY)

## 2024-03-15 ENCOUNTER — Inpatient Hospital Stay (HOSPITAL_COMMUNITY)
Admission: EM | Admit: 2024-03-15 | Discharge: 2024-03-26 | DRG: 191 | Disposition: A | Attending: Internal Medicine | Admitting: Internal Medicine

## 2024-03-15 DIAGNOSIS — F411 Generalized anxiety disorder: Secondary | ICD-10-CM | POA: Diagnosis present

## 2024-03-15 DIAGNOSIS — F0394 Unspecified dementia, unspecified severity, with anxiety: Secondary | ICD-10-CM | POA: Diagnosis present

## 2024-03-15 DIAGNOSIS — I513 Intracardiac thrombosis, not elsewhere classified: Secondary | ICD-10-CM | POA: Diagnosis present

## 2024-03-15 DIAGNOSIS — R062 Wheezing: Secondary | ICD-10-CM

## 2024-03-15 DIAGNOSIS — R0603 Acute respiratory distress: Principal | ICD-10-CM

## 2024-03-15 DIAGNOSIS — N1832 Chronic kidney disease, stage 3b: Secondary | ICD-10-CM | POA: Diagnosis present

## 2024-03-15 DIAGNOSIS — Z7901 Long term (current) use of anticoagulants: Secondary | ICD-10-CM

## 2024-03-15 DIAGNOSIS — Z79899 Other long term (current) drug therapy: Secondary | ICD-10-CM

## 2024-03-15 DIAGNOSIS — Z887 Allergy status to serum and vaccine status: Secondary | ICD-10-CM

## 2024-03-15 DIAGNOSIS — Z862 Personal history of diseases of the blood and blood-forming organs and certain disorders involving the immune mechanism: Secondary | ICD-10-CM

## 2024-03-15 DIAGNOSIS — E875 Hyperkalemia: Secondary | ICD-10-CM | POA: Diagnosis present

## 2024-03-15 DIAGNOSIS — J383 Other diseases of vocal cords: Secondary | ICD-10-CM | POA: Diagnosis present

## 2024-03-15 DIAGNOSIS — Z833 Family history of diabetes mellitus: Secondary | ICD-10-CM

## 2024-03-15 DIAGNOSIS — I2510 Atherosclerotic heart disease of native coronary artery without angina pectoris: Secondary | ICD-10-CM | POA: Diagnosis present

## 2024-03-15 DIAGNOSIS — D631 Anemia in chronic kidney disease: Secondary | ICD-10-CM | POA: Diagnosis present

## 2024-03-15 DIAGNOSIS — E0590 Thyrotoxicosis, unspecified without thyrotoxic crisis or storm: Secondary | ICD-10-CM | POA: Diagnosis present

## 2024-03-15 DIAGNOSIS — Z853 Personal history of malignant neoplasm of breast: Secondary | ICD-10-CM

## 2024-03-15 DIAGNOSIS — Z91410 Personal history of adult physical and sexual abuse: Secondary | ICD-10-CM

## 2024-03-15 DIAGNOSIS — L89322 Pressure ulcer of left buttock, stage 2: Secondary | ICD-10-CM | POA: Diagnosis present

## 2024-03-15 DIAGNOSIS — N179 Acute kidney failure, unspecified: Secondary | ICD-10-CM | POA: Diagnosis present

## 2024-03-15 DIAGNOSIS — I5043 Acute on chronic combined systolic (congestive) and diastolic (congestive) heart failure: Secondary | ICD-10-CM

## 2024-03-15 DIAGNOSIS — J441 Chronic obstructive pulmonary disease with (acute) exacerbation: Principal | ICD-10-CM | POA: Diagnosis present

## 2024-03-15 DIAGNOSIS — I5022 Chronic systolic (congestive) heart failure: Secondary | ICD-10-CM | POA: Diagnosis present

## 2024-03-15 DIAGNOSIS — Z8659 Personal history of other mental and behavioral disorders: Secondary | ICD-10-CM

## 2024-03-15 DIAGNOSIS — Z888 Allergy status to other drugs, medicaments and biological substances status: Secondary | ICD-10-CM

## 2024-03-15 DIAGNOSIS — I493 Ventricular premature depolarization: Secondary | ICD-10-CM | POA: Diagnosis present

## 2024-03-15 DIAGNOSIS — I130 Hypertensive heart and chronic kidney disease with heart failure and stage 1 through stage 4 chronic kidney disease, or unspecified chronic kidney disease: Secondary | ICD-10-CM | POA: Diagnosis present

## 2024-03-15 DIAGNOSIS — F0390 Unspecified dementia without behavioral disturbance: Secondary | ICD-10-CM

## 2024-03-15 DIAGNOSIS — Z8249 Family history of ischemic heart disease and other diseases of the circulatory system: Secondary | ICD-10-CM

## 2024-03-15 DIAGNOSIS — Z91010 Allergy to peanuts: Secondary | ICD-10-CM

## 2024-03-15 DIAGNOSIS — M545 Low back pain, unspecified: Secondary | ICD-10-CM | POA: Diagnosis present

## 2024-03-15 DIAGNOSIS — Y93E1 Activity, personal bathing and showering: Secondary | ICD-10-CM

## 2024-03-15 DIAGNOSIS — Z8679 Personal history of other diseases of the circulatory system: Secondary | ICD-10-CM

## 2024-03-15 DIAGNOSIS — Z923 Personal history of irradiation: Secondary | ICD-10-CM

## 2024-03-15 DIAGNOSIS — I428 Other cardiomyopathies: Secondary | ICD-10-CM | POA: Diagnosis present

## 2024-03-15 DIAGNOSIS — Z7983 Long term (current) use of bisphosphonates: Secondary | ICD-10-CM

## 2024-03-15 DIAGNOSIS — Z8349 Family history of other endocrine, nutritional and metabolic diseases: Secondary | ICD-10-CM

## 2024-03-15 DIAGNOSIS — M810 Age-related osteoporosis without current pathological fracture: Secondary | ICD-10-CM | POA: Diagnosis present

## 2024-03-15 DIAGNOSIS — E871 Hypo-osmolality and hyponatremia: Secondary | ICD-10-CM | POA: Diagnosis present

## 2024-03-15 DIAGNOSIS — Z8616 Personal history of COVID-19: Secondary | ICD-10-CM

## 2024-03-15 DIAGNOSIS — M1711 Unilateral primary osteoarthritis, right knee: Secondary | ICD-10-CM | POA: Diagnosis present

## 2024-03-15 DIAGNOSIS — Z6834 Body mass index (BMI) 34.0-34.9, adult: Secondary | ICD-10-CM

## 2024-03-15 DIAGNOSIS — E785 Hyperlipidemia, unspecified: Secondary | ICD-10-CM | POA: Diagnosis present

## 2024-03-15 DIAGNOSIS — I480 Paroxysmal atrial fibrillation: Secondary | ICD-10-CM | POA: Diagnosis present

## 2024-03-15 DIAGNOSIS — Z1152 Encounter for screening for COVID-19: Secondary | ICD-10-CM

## 2024-03-15 DIAGNOSIS — Z7409 Other reduced mobility: Secondary | ICD-10-CM | POA: Diagnosis present

## 2024-03-15 DIAGNOSIS — Y92002 Bathroom of unspecified non-institutional (private) residence single-family (private) house as the place of occurrence of the external cause: Secondary | ICD-10-CM

## 2024-03-15 DIAGNOSIS — Z953 Presence of xenogenic heart valve: Secondary | ICD-10-CM

## 2024-03-15 DIAGNOSIS — Z87891 Personal history of nicotine dependence: Secondary | ICD-10-CM

## 2024-03-15 DIAGNOSIS — W010XXA Fall on same level from slipping, tripping and stumbling without subsequent striking against object, initial encounter: Secondary | ICD-10-CM | POA: Diagnosis present

## 2024-03-15 DIAGNOSIS — E66811 Obesity, class 1: Secondary | ICD-10-CM | POA: Diagnosis present

## 2024-03-15 LAB — URINALYSIS, ROUTINE W REFLEX MICROSCOPIC
Bilirubin Urine: NEGATIVE
Glucose, UA: NEGATIVE mg/dL
Hgb urine dipstick: NEGATIVE
Ketones, ur: NEGATIVE mg/dL
Leukocytes,Ua: NEGATIVE
Nitrite: NEGATIVE
Protein, ur: NEGATIVE mg/dL
Specific Gravity, Urine: 1.01 (ref 1.005–1.030)
pH: 6 (ref 5.0–8.0)

## 2024-03-15 LAB — COMPREHENSIVE METABOLIC PANEL WITH GFR
ALT: 47 U/L — ABNORMAL HIGH (ref 0–44)
AST: 66 U/L — ABNORMAL HIGH (ref 15–41)
Albumin: 2.9 g/dL — ABNORMAL LOW (ref 3.5–5.0)
Alkaline Phosphatase: 98 U/L (ref 38–126)
Anion gap: 10 (ref 5–15)
BUN: 24 mg/dL — ABNORMAL HIGH (ref 8–23)
CO2: 21 mmol/L — ABNORMAL LOW (ref 22–32)
Calcium: 9.3 mg/dL (ref 8.9–10.3)
Chloride: 107 mmol/L (ref 98–111)
Creatinine, Ser: 1.59 mg/dL — ABNORMAL HIGH (ref 0.44–1.00)
GFR, Estimated: 33 mL/min — ABNORMAL LOW
Glucose, Bld: 122 mg/dL — ABNORMAL HIGH (ref 70–99)
Potassium: 4.3 mmol/L (ref 3.5–5.1)
Sodium: 137 mmol/L (ref 135–145)
Total Bilirubin: 0.7 mg/dL (ref 0.0–1.2)
Total Protein: 6 g/dL — ABNORMAL LOW (ref 6.5–8.1)

## 2024-03-15 LAB — CBC WITH DIFFERENTIAL/PLATELET
Abs Immature Granulocytes: 0.05 K/uL (ref 0.00–0.07)
Basophils Absolute: 0 K/uL (ref 0.0–0.1)
Basophils Relative: 0 %
Eosinophils Absolute: 0.1 K/uL (ref 0.0–0.5)
Eosinophils Relative: 2 %
HCT: 30.3 % — ABNORMAL LOW (ref 36.0–46.0)
Hemoglobin: 9.9 g/dL — ABNORMAL LOW (ref 12.0–15.0)
Immature Granulocytes: 1 %
Lymphocytes Relative: 10 %
Lymphs Abs: 0.8 K/uL (ref 0.7–4.0)
MCH: 31.2 pg (ref 26.0–34.0)
MCHC: 32.7 g/dL (ref 30.0–36.0)
MCV: 95.6 fL (ref 80.0–100.0)
Monocytes Absolute: 0.6 K/uL (ref 0.1–1.0)
Monocytes Relative: 7 %
Neutro Abs: 6.7 K/uL (ref 1.7–7.7)
Neutrophils Relative %: 80 %
Platelets: 40 K/uL — ABNORMAL LOW (ref 150–400)
RBC: 3.17 MIL/uL — ABNORMAL LOW (ref 3.87–5.11)
RDW: 13.9 % (ref 11.5–15.5)
Smear Review: NORMAL
WBC: 8.3 K/uL (ref 4.0–10.5)
nRBC: 0 % (ref 0.0–0.2)

## 2024-03-15 LAB — RESP PANEL BY RT-PCR (RSV, FLU A&B, COVID)  RVPGX2
Influenza A by PCR: NEGATIVE
Influenza B by PCR: NEGATIVE
Resp Syncytial Virus by PCR: NEGATIVE
SARS Coronavirus 2 by RT PCR: NEGATIVE

## 2024-03-15 LAB — PRO BRAIN NATRIURETIC PEPTIDE: Pro Brain Natriuretic Peptide: 2743 pg/mL — ABNORMAL HIGH

## 2024-03-15 LAB — CREATININE, URINE, RANDOM: Creatinine, Urine: 66 mg/dL

## 2024-03-15 LAB — SODIUM, URINE, RANDOM: Sodium, Ur: 111 mmol/L

## 2024-03-15 MED ORDER — IPRATROPIUM-ALBUTEROL 0.5-2.5 (3) MG/3ML IN SOLN
3.0000 mL | Freq: Four times a day (QID) | RESPIRATORY_TRACT | Status: DC | PRN
  Administered 2024-03-18: 3 mL via RESPIRATORY_TRACT

## 2024-03-15 MED ORDER — ONDANSETRON HCL 4 MG PO TABS: 4.0000 mg | ORAL_TABLET | Freq: Four times a day (QID) | ORAL | Status: DC | PRN

## 2024-03-15 MED ORDER — METHYLPREDNISOLONE SODIUM SUCC 125 MG IJ SOLR
125.0000 mg | INTRAMUSCULAR | Status: AC
  Administered 2024-03-15: 125 mg via INTRAVENOUS
  Filled 2024-03-15: qty 2

## 2024-03-15 MED ORDER — SODIUM CHLORIDE 0.9% FLUSH
3.0000 mL | Freq: Two times a day (BID) | INTRAVENOUS | Status: DC
  Administered 2024-03-15 – 2024-03-25 (×13): 3 mL via INTRAVENOUS

## 2024-03-15 MED ORDER — MIDODRINE HCL 5 MG PO TABS
10.0000 mg | ORAL_TABLET | Freq: Once | ORAL | Status: AC
  Administered 2024-03-15: 10 mg via ORAL
  Filled 2024-03-15: qty 2

## 2024-03-15 MED ORDER — SODIUM CHLORIDE 0.9% FLUSH: 3.0000 mL | INTRAVENOUS | Status: DC | PRN

## 2024-03-15 MED ORDER — APIXABAN 2.5 MG PO TABS
2.5000 mg | ORAL_TABLET | Freq: Two times a day (BID) | ORAL | Status: DC
  Administered 2024-03-15 – 2024-03-16 (×2): 2.5 mg via ORAL
  Filled 2024-03-15 (×2): qty 1

## 2024-03-15 MED ORDER — ROSUVASTATIN CALCIUM 20 MG PO TABS
20.0000 mg | ORAL_TABLET | Freq: Every day | ORAL | Status: DC
  Administered 2024-03-16 – 2024-03-26 (×11): 20 mg via ORAL
  Filled 2024-03-15 (×11): qty 1

## 2024-03-15 MED ORDER — ACETAMINOPHEN 650 MG RE SUPP: 650.0000 mg | Freq: Four times a day (QID) | RECTAL | Status: DC | PRN

## 2024-03-15 MED ORDER — TRAMADOL HCL 50 MG PO TABS
50.0000 mg | ORAL_TABLET | Freq: Three times a day (TID) | ORAL | Status: AC | PRN
  Administered 2024-03-15 – 2024-03-16 (×2): 50 mg via ORAL
  Filled 2024-03-15 (×2): qty 1

## 2024-03-15 MED ORDER — CARVEDILOL 25 MG PO TABS
25.0000 mg | ORAL_TABLET | Freq: Two times a day (BID) | ORAL | Status: DC
  Administered 2024-03-16 – 2024-03-26 (×21): 25 mg via ORAL
  Filled 2024-03-15 (×10): qty 1
  Filled 2024-03-15: qty 2
  Filled 2024-03-15 (×9): qty 1

## 2024-03-15 MED ORDER — ONDANSETRON HCL 4 MG/2ML IJ SOLN: 4.0000 mg | Freq: Four times a day (QID) | INTRAMUSCULAR | Status: DC | PRN

## 2024-03-15 MED ORDER — ACETAMINOPHEN 325 MG PO TABS
650.0000 mg | ORAL_TABLET | Freq: Four times a day (QID) | ORAL | Status: DC | PRN
  Administered 2024-03-16 – 2024-03-22 (×13): 650 mg via ORAL
  Filled 2024-03-15 (×13): qty 2

## 2024-03-15 MED ORDER — IPRATROPIUM-ALBUTEROL 0.5-2.5 (3) MG/3ML IN SOLN
3.0000 mL | Freq: Four times a day (QID) | RESPIRATORY_TRACT | Status: DC
  Administered 2024-03-16 (×3): 3 mL via RESPIRATORY_TRACT
  Filled 2024-03-15 (×3): qty 3

## 2024-03-15 MED ORDER — METHIMAZOLE 5 MG PO TABS
5.0000 mg | ORAL_TABLET | Freq: Three times a day (TID) | ORAL | Status: DC
  Administered 2024-03-15 – 2024-03-26 (×31): 5 mg via ORAL
  Filled 2024-03-15 (×35): qty 1

## 2024-03-15 MED ORDER — FUROSEMIDE 10 MG/ML IJ SOLN: 40.0000 mg | Freq: Once | INTRAMUSCULAR | Status: DC

## 2024-03-15 MED ORDER — FUROSEMIDE 10 MG/ML IJ SOLN
20.0000 mg | Freq: Every day | INTRAMUSCULAR | Status: AC
  Administered 2024-03-16 – 2024-03-18 (×3): 20 mg via INTRAVENOUS
  Filled 2024-03-15 (×3): qty 2

## 2024-03-15 MED ORDER — ALBUTEROL SULFATE (2.5 MG/3ML) 0.083% IN NEBU
10.0000 mg/h | INHALATION_SOLUTION | Freq: Once | RESPIRATORY_TRACT | Status: AC
  Administered 2024-03-15: 10 mg/h via RESPIRATORY_TRACT
  Filled 2024-03-15: qty 12

## 2024-03-15 MED ORDER — ALBUTEROL SULFATE (2.5 MG/3ML) 0.083% IN NEBU
5.0000 mg | INHALATION_SOLUTION | Freq: Once | RESPIRATORY_TRACT | Status: AC
  Administered 2024-03-15: 5 mg via RESPIRATORY_TRACT
  Filled 2024-03-15: qty 6

## 2024-03-15 MED ORDER — SODIUM CHLORIDE 0.9 % IV SOLN: 250.0000 mL | INTRAVENOUS | Status: AC | PRN

## 2024-03-15 MED ORDER — PANTOPRAZOLE SODIUM 40 MG PO TBEC
40.0000 mg | DELAYED_RELEASE_TABLET | Freq: Every day | ORAL | Status: DC
  Administered 2024-03-16 – 2024-03-26 (×11): 40 mg via ORAL
  Filled 2024-03-15 (×11): qty 1

## 2024-03-15 MED ORDER — SERTRALINE HCL 25 MG PO TABS
50.0000 mg | ORAL_TABLET | Freq: Every day | ORAL | Status: DC
  Administered 2024-03-15 – 2024-03-26 (×12): 50 mg via ORAL
  Filled 2024-03-15: qty 1
  Filled 2024-03-15: qty 2
  Filled 2024-03-15 (×4): qty 1
  Filled 2024-03-15: qty 2
  Filled 2024-03-15 (×4): qty 1
  Filled 2024-03-15: qty 2

## 2024-03-15 MED ORDER — FUROSEMIDE 10 MG/ML IJ SOLN
20.0000 mg | Freq: Once | INTRAMUSCULAR | Status: AC
  Administered 2024-03-15: 20 mg via INTRAVENOUS
  Filled 2024-03-15: qty 2

## 2024-03-15 MED ORDER — AZITHROMYCIN 200 MG/5ML PO SUSR: 500.0000 mg | Freq: Every day | ORAL | Status: DC

## 2024-03-15 MED ORDER — MELATONIN 5 MG PO TABS
5.0000 mg | ORAL_TABLET | Freq: Every day | ORAL | Status: DC
  Administered 2024-03-15 – 2024-03-25 (×11): 5 mg via ORAL
  Filled 2024-03-15 (×11): qty 1

## 2024-03-15 MED ORDER — TRAMADOL HCL 50 MG PO TABS: 50.0000 mg | ORAL_TABLET | Freq: Two times a day (BID) | ORAL | Status: DC | PRN

## 2024-03-15 MED ORDER — IPRATROPIUM-ALBUTEROL 0.5-2.5 (3) MG/3ML IN SOLN
3.0000 mL | Freq: Once | RESPIRATORY_TRACT | Status: AC
  Administered 2024-03-15: 3 mL via RESPIRATORY_TRACT
  Filled 2024-03-15: qty 3

## 2024-03-15 MED ORDER — AZITHROMYCIN 200 MG/5ML PO SUSR
500.0000 mg | Freq: Every day | ORAL | Status: AC
  Administered 2024-03-15 – 2024-03-17 (×3): 500 mg via ORAL
  Filled 2024-03-15 (×4): qty 12.5

## 2024-03-15 MED ORDER — METHYLPREDNISOLONE SODIUM SUCC 40 MG IJ SOLR
40.0000 mg | Freq: Every day | INTRAMUSCULAR | Status: AC
  Administered 2024-03-16 – 2024-03-19 (×4): 40 mg via INTRAVENOUS
  Filled 2024-03-15 (×4): qty 1

## 2024-03-15 NOTE — Progress Notes (Signed)
 I have been following Danielle [name]'s as a part of the community nurse program for quite some time. Her caregiver reach in due to her recent breathing distress and ask for advocacy in getting her admitted for complete treatment as she has rapidly declined since having a fall a few days ago. He states her lower extremities have swollen to twice the normal size and she began having shortness of breath. I will share this information with TOC for additional follow up and support. Please let me know how I can help if needed.

## 2024-03-15 NOTE — H&P (Signed)
 " History and Physical    [name] FMW:[id number] DOB: 1945/03/26 DOA: 03/15/2024  PCP: [name], [name], MD   Patient coming from: Home   Chief Complaint:  Chief Complaint  Patient presents with   Shortness of Breath   ED TRIAGE note:Pt bibems from home. Complaints of shob. Audible wheezing when EMS arrived. After 1 duoneb pt was clearer, given a 2nd duoneb with EMS. BP dropped in route from 102 to 88 systolic. Pt has dementia.   RA 100%  HR 70 CBG 152  HPI:  Danielle [name] is a 79 y.o. female with medical history COPD, dementia, aortic insufficiency status post bioprosthetic aortic valve placement in 2016, HFrEF 40 to 45%, paroxysmal atrial fibrillation on Eliquis , prior CVA x 2, CKD 3B, hyperthyroidism, ITP-chronically low platelet count, and history of breast cancer treated with lumpectomy radiation presented to emergency department via EMS with complaining of shortness of breath.  Patient also complaining about orthopnea and PND.  Denies any lower extremity swelling.  Per patient since the fall from 1/13 her mobility has been declining and she is complaining of pain all over.  Patient denies any chest pain, palpitation, and chills.  Complaining about dry nonproductive cough and wheezing. Patient has history of dementia however currently she is alert oriented and able to following commands and answering questions appropriately.  Per documentation and history of EMS patient was seen in the emergency department few days ago after a fall (all imaging and workup negative patient discharged to home) and today she is presenting with shortness of breath.  EMS reported patient has audible wheezing received DuoNeb x 2 with some improvement afterward blood pressure slightly drop systolic around 88.   ED Course:  At presentation to ED patient is hemodynamically stable x-ray blood pressure is borderline soft 105/71.  O2 sat 100% room air.  Afebrile. Lab work,  respiratory panel negative. CMP showing low bicarb 21, elevated creatinine 1.59, elevated AST of 66, ALT 47 and low GFR 33. Elevated proBNP 2743. CBC showed low hemoglobin 9.9 hematocrit 30 (baseline hemoglobin around 10-12) and platelet count 40 (baseline platelet count 53-60).  Chest x-ray unremarkable no evidence of pneumonia pneumothorax or pleural effusion.  EKG showed normal sinus rhythm heart rate 85.  Premature ventricular complex.  While in the ED patient received DuoNeb total 2 doses (total 4 doses as of now, Solu-Medrol  125 mg and Lasix  20 mg.  Hospitalist consulted for further evaluation management of COPD exacerbation, CHF exacerbation and AKI on CKD stage IIIb   Significant labs in the ED: Lab Orders         Resp panel by RT-PCR (RSV, Flu A&B, Covid) Anterior Nasal Swab         Comprehensive metabolic panel         CBC with Differential/Platelet         Pro Brain natriuretic peptide         CBC         Basic metabolic panel         Urinalysis, Routine w reflex microscopic -Urine, Clean Catch         Creatinine, urine, random         Sodium, urine, random       Review of Systems:  Review of Systems  Constitutional:  Negative for chills, fever, malaise/fatigue and weight loss.  Respiratory:  Positive for cough, shortness of breath and wheezing. Negative for sputum production.   Cardiovascular:  Positive for orthopnea and PND. Negative for  chest pain, palpitations and leg swelling.  Gastrointestinal:  Negative for heartburn, nausea and vomiting.  Neurological:  Negative for dizziness and headaches.  Psychiatric/Behavioral:  The patient is not nervous/anxious.     Past Medical History:  Diagnosis Date   Anticoagulated on Coumadin     managed by cardiology   Anxiety    Chronic constipation    Chronic systolic CHF (congestive heart failure) (HCC) 2016   cardiologist--- dr end and followed by CHF clinic   CKD (chronic kidney disease), stage III (HCC)    COPD  (chronic obstructive pulmonary disease) (HCC)    previous had seen pulmonology --- dr darlean, note in epic 03-14-2016, dx COPD GOLD 0   Coronary artery disease    cardiologist--- dr dayle;  last cardiac cath 01-10-20202 mild nonobstructive disease proxLAD;  per pt, had LHC prior to AVR in [place] 2016   DOE (dyspnea on exertion)    GERD (gastroesophageal reflux disease)    History of 2019 novel coronavirus disease (COVID-19) 12/19/2019   hospital admission w/ covid pneumonia , no intubatiion,  per pt symptoms resolved and back to baseline   History of cerebrovascular accident (CVA) with residual deficit    CVA in 2000 without residuals;  CVA post op AVR 01/ 2016 with residual right hand weakness   History of gastritis    History of pneumothorax    s/p right vats 11-19-2014 and  s/p left vats  12-13-2015   (both spontenaous due to bleb)   Hyperlipidemia    Hypertension    Hyperthyroidism    per pt followed by pcp,  dx approx 2014   Malignant neoplasm of upper-outer quadrant of left breast in female, 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   oncologist-- dr lanny--- dx 10/ 2018  ,  s/p left lumptectomy with node dissection;  completed radiation 04-15-2017, no chemo   Multiple thyroid  nodules    in care everywhere pt had left thyroid  nodule biospy 09-18-2014 benign   Nausea, vomiting, and diarrhea 12/25/2022   NICM (nonischemic cardiomyopathy) (HCC) 2016   2016  ef 30%;  last echo in epic ef 45%   OA (osteoarthritis)    Osteoporosis    Paroxysmal atrial fibrillation [facility])    cardiologist--- dr end   Personal history of radiation therapy    left breast cancer  03-08-2017  to 04-05-2017   Thrombocytopenia    followed by dr lanny   Valvular stenosis, aortic 03/12/2014   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   Wears dentures    full upper    Past Surgical History:  Procedure Laterality Date   AORTIC VALVE REPLACEMENT  03/12/2014   [facility] in [place];  St  Jude 23mm, medial Trifecta Bioprosthesis   BREAST LUMPECTOMY WITH RADIOACTIVE SEED AND SENTINEL LYMPH NODE BIOPSY Left 01/14/2017   Procedure: LEFT BREAST LUMPECTOMY WITH RADIOACTIVE SEED AND SENTINEL LYMPH NODE BIOPSY;  Surgeon: [name], MD;  Location: [facility] OR;  Service: General;  Laterality: Left;   CARDIAC CATHETERIZATION  02/12/2014   [facility] in [place]   HYSTEROSCOPY WITH D & C N/A 11/17/2020   Procedure: DILATATION AND CURETTAGE /HYSTEROSCOPY WITH MYOSURE;  Surgeon: Cathlyn JAYSON Nikki Bobie FORBES, MD;  Location: [facility];  Service: Gynecology;  Laterality: N/A;   INTRAMEDULLARY (IM) NAIL INTERTROCHANTERIC Left 12/10/2015   Procedure: INTRAMEDULLARY (IM) NAIL INTERTROCHANTRIC;  Surgeon: Kay CHRISTELLA Cummins, MD;  Location: [facility] OR;  Service: Orthopedics;  Laterality: Left;   OPERATIVE  ULTRASOUND N/A 11/17/2020   Procedure: OPERATIVE ULTRASOUND;  Surgeon: Cathlyn JAYSON Nikki Bobie FORBES, MD;  Location: [facility];  Service: Gynecology;  Laterality: N/A;   PLEURADESIS Left 12/03/2015   Procedure: PLEURADESIS;  Surgeon: Elspeth JAYSON Millers, MD;  Location: [facility] OR;  Service: Thoracic;  Laterality: Left;   RESECTION OF APICAL BLEB Left 12/03/2015   Procedure: BLEBECTOMY;  Surgeon: Elspeth JAYSON Millers, MD;  Location: [facility] OR;  Service: Thoracic;  Laterality: Left;   RIGHT/LEFT HEART CATH AND CORONARY ANGIOGRAPHY N/A 03/10/2020   Procedure: RIGHT/LEFT HEART CATH AND CORONARY ANGIOGRAPHY;  Surgeon: Mady Bruckner, MD;  Location: [facility] INVASIVE CV LAB;  Service: Cardiovascular;  Laterality: N/A;   VIDEO ASSISTED THORACOSCOPY Left 12/03/2015   Procedure: VIDEO ASSISTED THORACOSCOPY;  Surgeon: Elspeth JAYSON Millers, MD;  Location: [facility] OR;  Service: Thoracic;  Laterality: Left;   VIDEO ASSISTED THORACOSCOPY (VATS) W/TALC  PLEUADESIS Right 11/19/2014   in [place]     reports that she quit smoking about 14 years ago. Her smoking use included cigarettes. She started smoking about 24  years ago. She has a 2.5 pack-year smoking history. She has never used smokeless tobacco. She reports that she does not drink alcohol and does not use drugs.  Allergies[1]  Family History  Problem Relation Age of Onset   Diabetes Mother    Heart attack Mother 20   Diabetes Father    Lung cancer Father    Diabetes Sister    Thyroid  disease Sister    Diabetes Sister    HIV Brother    Colon polyps Neg Hx    Colon cancer Neg Hx     Prior to Admission medications  Medication Sig Start Date End Date Taking? Authorizing Provider  acetaminophen  (TYLENOL ) 325 MG tablet Take 2 tablets (650 mg total) by mouth every 6 (six) hours as needed for mild pain (pain score 1-3), fever or headache. 01/19/23   Danton Reyes DASEN, MD  albuterol  (VENTOLIN  HFA) 108 (90 Base) MCG/ACT inhaler Inhale 2 puffs into the lungs every 4 (four) hours as needed for shortness of breath. 12/12/23   [provider]  alendronate  (FOSAMAX ) 70 MG tablet Take 70 mg by mouth once a week. Take with a full glass of water on an empty stomach.    [provider]  apixaban  (ELIQUIS ) 5 MG TABS tablet Take 1 tablet (5 mg total) by mouth 2 (two) times daily. 05/04/23   Lee, [name], NP  carvedilol  (COREG ) 25 MG tablet Take 1 tablet (25 mg total) by mouth 2 (two) times daily with a meal. 08/30/22   Glena Harlene HERO, FNP  Incontinence Supply Disposable MISC Adult diapers extra large/female 11/02/23   [name], [name], MD  losartan  (COZAAR ) 25 MG tablet Take 1 tablet (25 mg total) by mouth daily. 12/13/23   [name], [name], MD  melatonin 5 MG TABS Take 5 mg by mouth at bedtime.    [provider]  methimazole  (TAPAZOLE ) 5 MG tablet TAKE 1 TABLET BY MOUTH 3 TIMES  DAILY 02/13/24   [name], [name], MD  omeprazole  (PRILOSEC) 40 MG capsule Take 40 mg by mouth daily. 12/06/23   [provider]  rosuvastatin  (CRESTOR ) 20 MG tablet TAKE 1 TABLET BY MOUTH ONCE  DAILY 02/13/24   Clegg, Amy D, NP  sertraline  (ZOLOFT ) 50  MG tablet Take 50 mg by mouth daily. 01/21/24   [provider]  spironolactone  (ALDACTONE ) 25 MG tablet TAKE 1 TABLET BY MOUTH DAILY 03/07/24  Lee, [name], NP  trolamine salicylate (ASPERCREME) 10 % cream Apply 1 application topically as needed for muscle pain.    [provider]     Physical Exam: Vitals:   03/15/24 1557 03/15/24 1602 03/15/24 1900  BP: (!) 125/102  105/71  Pulse: 85  79  Resp: (!) 21  (!) 22  Temp: 98.9 F (37.2 C)  98.9 F (37.2 C)  TempSrc: Oral    SpO2: 97% 98% 100%    Physical Exam Vitals and nursing note reviewed.  Constitutional:      General: She is not in acute distress.    Appearance: She is ill-appearing.  Cardiovascular:     Rate and Rhythm: Normal rate and regular rhythm.  Pulmonary:     Effort: Pulmonary effort is normal.     Breath sounds: Wheezing present. No decreased breath sounds, rhonchi or rales.  Musculoskeletal:     Right lower leg: No edema.     Left lower leg: No edema.  Skin:    Capillary Refill: Capillary refill takes less than 2 seconds.  Neurological:     Mental Status: She is alert.     Comments: Dementia at baseline  Psychiatric:        Mood and Affect: Mood normal. Mood is not anxious.        Behavior: Behavior is not agitated.      Labs on Admission: I have personally reviewed following labs and imaging studies  CBC: Recent Labs  Lab 03/13/24 1412 03/15/24 1721  WBC 9.4 8.3  NEUTROABS 7.6 6.7  HGB 10.8* 9.9*  HCT 33.2* 30.3*  MCV 94.3 95.6  PLT 37* 40*   Basic Metabolic Panel: Recent Labs  Lab 03/13/24 1543 03/15/24 1721  NA 138 137  K 4.1 4.3  CL 106 107  CO2 21* 21*  GLUCOSE 103* 122*  BUN 13 24*  CREATININE 1.30* 1.59*  CALCIUM  9.4 9.3   GFR: CrCl cannot be calculated (Unknown ideal weight.). Liver Function Tests: Recent Labs  Lab 03/15/24 1721  AST 66*  ALT 47*  ALKPHOS 98  BILITOT 0.7  PROT 6.0*  ALBUMIN  2.9*   No results for input(s): LIPASE, AMYLASE in  the last 168 hours. No results for input(s): AMMONIA in the last 168 hours. Coagulation Profile: No results for input(s): INR, PROTIME in the last 168 hours. Cardiac Enzymes: No results for input(s): CKTOTAL, CKMB, CKMBINDEX, TROPONINI, TROPONINIHS in the last 168 hours. BNP (last 3 results) Recent Labs    05/04/23 1315  BNP 115.5*   HbA1C: No results for input(s): HGBA1C in the last 72 hours. CBG: No results for input(s): GLUCAP in the last 168 hours. Lipid Profile: No results for input(s): CHOL, HDL, LDLCALC, TRIG, CHOLHDL, LDLDIRECT in the last 72 hours. Thyroid  Function Tests: No results for input(s): TSH, T4TOTAL, FREET4, T3FREE, THYROIDAB in the last 72 hours. Anemia Panel: No results for input(s): VITAMINB12, FOLATE, FERRITIN, TIBC, IRON, RETICCTPCT in the last 72 hours. Urine analysis:    Component Value Date/Time   COLORURINE YELLOW 06/23/2023 0104   APPEARANCEUR CLEAR 06/23/2023 0104   LABSPEC 1.036 (H) 06/23/2023 0104   PHURINE 5.0 06/23/2023 0104   GLUCOSEU NEGATIVE 06/23/2023 0104   GLUCOSEU 500 (A) 12/08/2020 0926   HGBUR NEGATIVE 06/23/2023 0104   BILIRUBINUR NEGATIVE 06/23/2023 0104   KETONESUR NEGATIVE 06/23/2023 0104   PROTEINUR NEGATIVE 06/23/2023 0104   UROBILINOGEN 0.2 04/18/2021 1411   NITRITE NEGATIVE 06/23/2023 0104   LEUKOCYTESUR NEGATIVE 06/23/2023 0104    Radiological  Exams on Admission: I have personally reviewed images DG Chest Port 1 View Result Date: 03/15/2024 CLINICAL DATA:  Shortness of breath, audible wheezing EXAM: PORTABLE CHEST 1 VIEW COMPARISON:  03/13/2024 FINDINGS: Single frontal view of the chest demonstrates an unremarkable cardiac silhouette. Stable chronic interstitial prominence most consistent with background emphysema and scarring. No acute airspace disease, effusion, or pneumothorax. No acute bony abnormalities. IMPRESSION: 1. Stable chest, no acute process. Electronically  Signed   By: Ozell Daring M.D.   On: 03/15/2024 16:43    Assessment/Plan: Principal Problem:   COPD with acute exacerbation (HCC) Active Problems:   Acute on chronic combined systolic and diastolic CHF (congestive heart failure) (HCC)   Acute kidney injury superimposed on stage 3b chronic kidney disease (HCC)   Paroxysmal atrial fibrillation (HCC)   Hyperthyroidism   S/P aortic valve replacement with bioprosthetic valve   History of aortic insufficiency   History of ITP   History of breast cancer   History of dementia   GAD (generalized anxiety disorder)    Assessment and Plan: COPD with acute exacerbation -Presenting to emergency department complaining of shortness of breath and wheezing.  EMS discovered audible wheezing treated with DuoNeb x 2 with improvement of wheezing however blood pressure dropped to 88 later on it has been improved. - At presentation to ED patient is borderline hypotensive otherwise hemodynamically stable. -Lab work, respiratory panel negative. CMP showing low bicarb 21, elevated creatinine 1.59, elevated AST of 66, ALT 47 and low GFR 33. Elevated proBNP 2743. CBC showed low hemoglobin 9.9 hematocrit 30 (baseline hemoglobin around 10-12) and platelet count 40 (baseline platelet count 53-60). - Chest x-ray unremarkable no evidence of pneumonia pneumothorax or pleural effusion. -Patient has been treated with DuoNeb total 4 doses and Solu-Medrol  125 mg while in the ED - Currently on room air O2 sat 97 to 100%. - Continue DuoNeb every every 6 hours, as needed, starting IV Solu-Medrol  40 mg daily and oral azithromycin  500 mg for 3 days. -Continue supportive care.   Acute on chronic CHF exacerbation Essential hypertension History of aortic  insufficienc s/p  aortic valve placement with bioprosthesis -Patient presenting with complaining of orthopnea and dyspnea.  Physical exam no evidence of volume overload. - Lab work, respiratory panel negative. CMP  showing low bicarb 21, elevated creatinine 1.59, elevated AST of 66, ALT 47 and low GFR 33. Elevated proBNP 2743. -EKG showed normal sinus rhythm heart rate 85.  Premature ventricular complex. - While in the ED patient received DuoNeb total 2 doses (total 4 doses as of now, Solu-Medrol  125 mg and Lasix  20 mg. -In the ED patient received Lasix  20 mg.   -Continue to monitor urine output in the setting of AKI and borderline low soft blood pressure.  Holding losartan  and spironolactone . -Continue low-dose Lasix  20 mg daily, monitor urine output, strict I's/O and daily weight. - Based on patient blood pressure trend can reinitiate GDMT cardiac medications. - Obtain echocardiogram. - Continue cardiac monitoring.  AKI on CKD stage IIIb -Elevated creatinine 1.59 GFR 33.  Prerenal acute kidney injury in the setting of CHF and COPD exacerbation.  Holding losartan  and spironolactone . -Checking UA, urine creatinine and sodium. - Continue to monitor renal function, avoid nephrotoxic agent.  Paroxysmal A-fib -EKG showed normal sinus rhythm heart rate 85.  Continue Coreg  25 mg twice daily and Eliquis  5 mg twice daily.   Hyperthyroidism - Continue methimazole   History of chronic anemia -Low H&H 9.9 and 33.  Baseline hemoglobin variable around 10-12.  Continue to monitor development of any active bleeding.  History of ITP Low platelet count 40.  Baseline platelet count between 37-53.  Continue to monitor development of any mucosal bleeding/active bleeding.  Generalized anxiety disorder History of dementia -Continue Zoloft .  Recent fall History of left femoral neck fracture status post ORIF - Patient experienced recent fall 1/13.   -Imaging from 1/13 bilateral knees showing severe osteoarthritis.  No dislocation or fracture.  Chest x-ray no acute cardiopulmonary process.  CT head no acute intracranial abnormality.  CT cervical spine no evidence of fracture.  CT pelvis no acute osseous process.   Status post ORIF of the left femoral neck fracture.  CT lumbar spine no evidence of fracture. -Since the fall patient reported that she has been hurting all over specifically bilateral knees.  Underlying history of dementia and high risk for fall.  Consulting inpatient PT and OT for further evaluation of balance.  Giving tramadol  50 mg 3 times daily prn for moderate/severe pain control.   Hyperlipidemia - Continue Crestor    DVT prophylaxis:  Eliquis  Code Status:  Full Code Diet: Heart healthy diet Family Communication:   Family was present at bedside, at the time of interview. Opportunity was given to ask question and all questions were answered satisfactorily.  Disposition Plan: Continue to monitor improvement of COPD exacerbation.  Monitoring urine output/daily weight. Consults: None indicated at this time. Admission status:   Inpatient, Telemetry bed  Severity of Illness: The appropriate patient status for this patient is INPATIENT. Inpatient status is judged to be reasonable and necessary in order to provide the required intensity of service to ensure the patient's safety. The patient's presenting symptoms, physical exam findings, and initial radiographic and laboratory data in the context of their chronic comorbidities is felt to place them at high risk for further clinical deterioration. Furthermore, it is not anticipated that the patient will be medically stable for discharge from the hospital within 2 midnights of admission.   * I certify that at the point of admission it is my clinical judgment that the patient will require inpatient hospital care spanning beyond 2 midnights from the point of admission due to high intensity of service, high risk for further deterioration and high frequency of surveillance required.DEWAINE    Vinetta Brach, MD [facility]  How to contact the [facility] Attending or Consulting provider 7A - 7P or covering provider during after hours 7P -7A, for this  patient.  Check the care team in [facility] and look for a) attending/consulting [facility] provider listed and b) the [facility] team listed Log into [email or web address] and use [facility]'s universal password to access. If you do not have the password, please contact the hospital operator. Locate the [facility] provider you are looking for under [facility] and page to a number that you can be directly reached. If you still have difficulty reaching the provider, please page the [facility] (Director on Call) for the Hospitalists listed on amion for assistance.  03/15/2024, 8:25 PM           [1]  Allergies Allergen Reactions   Tetanus Toxoid Adsorbed Swelling    Arm swelling   Zestril  [Lisinopril ] Cough   Peanut (Diagnostic) Cough   "

## 2024-03-15 NOTE — ED Provider Notes (Signed)
 " [facility] DEPARTMENT AT [facility] Provider Note   CSN: [id number] Arrival date & time: 03/15/24  1550     Patient presents with: Shortness of Breath   Danielle [name] is a 79 y.o. female.   HPI Patient presents from home via EMS with shortness of breath.  Patient has dementia which is somewhat limiting, level 5 caveat History per EMS, per chart review, and I actually saw the patient a few days ago when she presented after a fall.  Please see documentation from the encounter for full details.  Today the patient complained of shortness of breath.  EMS reports patient had audible wheezing, receiving DuoNeb, x 2 with some improvement.  Blood pressure slightly low, 88 systolic.    Prior to Admission medications  Medication Sig Start Date End Date Taking? Authorizing Provider  acetaminophen  (TYLENOL ) 325 MG tablet Take 2 tablets (650 mg total) by mouth every 6 (six) hours as needed for mild pain (pain score 1-3), fever or headache. 01/19/23   Danton Reyes DASEN, MD  albuterol  (VENTOLIN  HFA) 108 [phone] Base) MCG/ACT inhaler Inhale 2 puffs into the lungs every 4 (four) hours as needed for shortness of breath. 12/12/23   [provider]  alendronate  (FOSAMAX ) 70 MG tablet Take 70 mg by mouth once a week. Take with a full glass of water on an empty stomach.    [provider]  apixaban  (ELIQUIS ) 5 MG TABS tablet Take 1 tablet (5 mg total) by mouth 2 (two) times daily. 05/04/23   Lee, [name], NP  carvedilol  (COREG ) 25 MG tablet Take 1 tablet (25 mg total) by mouth 2 (two) times daily with a meal. 08/30/22   Glena Harlene HERO, FNP  Incontinence Supply Disposable MISC Adult diapers extra large/female 11/02/23   [name], [name], MD  losartan  (COZAAR ) 25 MG tablet Take 1 tablet (25 mg total) by mouth daily. 12/13/23   [name], [name], MD  melatonin 5 MG TABS Take 5 mg by mouth at bedtime.    [provider]  methimazole  (TAPAZOLE ) 5 MG tablet TAKE  1 TABLET BY MOUTH 3 TIMES  DAILY 02/13/24   [name], [name], MD  omeprazole  (PRILOSEC) 40 MG capsule Take 40 mg by mouth daily. 12/06/23   [provider]  rosuvastatin  (CRESTOR ) 20 MG tablet TAKE 1 TABLET BY MOUTH ONCE  DAILY 02/13/24   Clegg, Amy D, NP  sertraline  (ZOLOFT ) 50 MG tablet Take 50 mg by mouth daily. 01/21/24   [provider]  spironolactone  (ALDACTONE ) 25 MG tablet TAKE 1 TABLET BY MOUTH DAILY 03/07/24   Lee, [name], NP  trolamine salicylate (ASPERCREME) 10 % cream Apply 1 application topically as needed for muscle pain.    [provider]    Allergies: Tetanus toxoid adsorbed, Zestril  [lisinopril ], and Peanut (diagnostic)    Review of Systems  Updated Vital Signs BP (!) 125/102 (BP Location: Left Arm)   Pulse 85   Temp 98.9 F (37.2 C) (Oral)   Resp (!) 21   SpO2 98%   Physical Exam Vitals and nursing note reviewed.  Constitutional:      General: She is not in acute distress.    Appearance: She is well-developed.  HENT:     Head: Normocephalic and atraumatic.  Eyes:     Conjunctiva/sclera: Conjunctivae normal.  Cardiovascular:     Rate and Rhythm: Normal rate and regular rhythm.  Pulmonary:     Effort: Tachypnea present.     Breath sounds: Decreased  breath sounds and wheezing present.  Abdominal:     General: There is no distension.  Musculoskeletal:     Comments: No obvious deformities, patient moves all extremity spontaneously.  Skin:    General: Skin is warm and dry.  Neurological:     Mental Status: She is alert.     Cranial Nerves: No cranial nerve deficit, dysarthria or facial asymmetry.     Motor: Atrophy present.  Psychiatric:        Mood and Affect: Mood is anxious.        Cognition and Memory: Cognition is impaired. Memory is impaired.     (all labs ordered are listed, but only abnormal results are displayed) Labs Reviewed  RESP PANEL BY RT-PCR (RSV, FLU A&B, COVID)  [id number]  COMPREHENSIVE METABOLIC PANEL WITH GFR   CBC WITH DIFFERENTIAL/PLATELET  PRO BRAIN NATRIURETIC PEPTIDE    EKG: None  Radiology: DG Chest Port 1 View Result Date: 03/15/2024 CLINICAL DATA:  Shortness of breath, audible wheezing EXAM: PORTABLE CHEST 1 VIEW COMPARISON:  03/13/2024 FINDINGS: Single frontal view of the chest demonstrates an unremarkable cardiac silhouette. Stable chronic interstitial prominence most consistent with background emphysema and scarring. No acute airspace disease, effusion, or pneumothorax. No acute bony abnormalities. IMPRESSION: 1. Stable chest, no acute process. Electronically Signed   By: Ozell Daring M.D.   On: 03/15/2024 16:43     Procedures   Medications Ordered in the ED  ipratropium-albuterol  (DUONEB) 0.5-2.5 (3) MG/3ML nebulizer solution 3 mL (has no administration in time range)  albuterol  (PROVENTIL ) (2.5 MG/3ML) 0.083% nebulizer solution 5 mg (5 mg Nebulization Given 03/15/24 1640)                                    Medical Decision Making Elderly female recent fall now presents with shortness of breath.  Since my most recent evaluation the patient few days ago, patient has audible wheezing, differential includes pneumonia, COPD exacerbation, less likely bacteremia, sepsis as she is afebrile, with no hypotension here. Patient's dementia is somewhat limiting.   Amount and/or Complexity of Data Reviewed Independent Historian: EMS External Data Reviewed: notes.    Details: As above Labs: ordered. Decision-making details documented in ED Course. Radiology: ordered and independent interpretation performed. Decision-making details documented in ED Course. ECG/medicine tests: ordered and independent interpretation performed. Decision-making details documented in ED Course.  Risk Prescription drug management. Decision regarding hospitalization.  After initial evaluation patient received albuterol  given ongoing wheezing. 5:48 PM Patient required additional breathing treatment due to  ongoing wheezing.  Review of chart from a few days ago, no fractures, no pneumothorax.  Today's x-ray also with no pneumothorax.  No pneumonia.  Labs largely noncontributory aside from elevated BNP  Update:, Patient now more calm, receiving additional breathing treatment.  With concern for dyspnea requiring multiple nebulizer therapies, likely multifactorial, COPD/CHF patient has also received steroids, diuretic, will be admitted for further monitoring, management.  No recurrence of the hypotension patient had en route.    Final diagnoses:  Respiratory distress  COPD exacerbation (HCC)  CRITICAL CARE Performed by: Lamar Salen Total critical care time: 35 minutes Critical care time was exclusive of separately billable procedures and treating other patients. Critical care was necessary to treat or prevent imminent or life-threatening deterioration. Critical care was time spent personally by me on the following activities: development of treatment plan with patient and/or surrogate as well as nursing, discussions with  consultants, evaluation of patient's response to treatment, examination of patient, obtaining history from patient or surrogate, ordering and performing treatments and interventions, ordering and review of laboratory studies, ordering and review of radiographic studies, pulse oximetry and re-evaluation of patient's condition.    Garrick Charleston, MD 03/16/24 0830  "

## 2024-03-15 NOTE — Congregational Nurse Program (Signed)
 I have been following Ms Pulliam-McEachean's as a part of the community nurse program for quite some time. Her caregiver reach in due to her recent breathing distress and ask for advocacy in getting her admitted for complete treatment as she has rapidly declined since having a fall a few days ago. He states her lower extremities have swollen to twice the normal size and she began having shortness of breath. I will share this information with TOC for additional follow up and support. Please let me know how I can help if needed. Lamiracle Chaidez M

## 2024-03-15 NOTE — ED Triage Notes (Signed)
 Pt bibems from home. Complaints of shob. Audible wheezing when EMS arrived. After 1 duoneb pt was clearer, given a 2nd duoneb with EMS. BP dropped in route from 102 to 88 systolic. Pt has dementia.  RA 100%  HR 70 CBG 152

## 2024-03-15 NOTE — TOC CM/SW Note (Signed)
 TOC consult received for d/c planning needs. Follow-up to be completed with patient as appropriate.   Merilee Batty, MSN, RN Case Management [phone]

## 2024-03-16 ENCOUNTER — Inpatient Hospital Stay (HOSPITAL_COMMUNITY)

## 2024-03-16 DIAGNOSIS — R062 Wheezing: Secondary | ICD-10-CM

## 2024-03-16 DIAGNOSIS — I10 Essential (primary) hypertension: Secondary | ICD-10-CM | POA: Diagnosis not present

## 2024-03-16 DIAGNOSIS — I5021 Acute systolic (congestive) heart failure: Secondary | ICD-10-CM | POA: Diagnosis not present

## 2024-03-16 DIAGNOSIS — F0390 Unspecified dementia without behavioral disturbance: Secondary | ICD-10-CM

## 2024-03-16 LAB — BASIC METABOLIC PANEL WITH GFR
Anion gap: 11 (ref 5–15)
BUN: 24 mg/dL — ABNORMAL HIGH (ref 8–23)
CO2: 20 mmol/L — ABNORMAL LOW (ref 22–32)
Calcium: 9.6 mg/dL (ref 8.9–10.3)
Chloride: 104 mmol/L (ref 98–111)
Creatinine, Ser: 1.4 mg/dL — ABNORMAL HIGH (ref 0.44–1.00)
GFR, Estimated: 38 mL/min — ABNORMAL LOW
Glucose, Bld: 177 mg/dL — ABNORMAL HIGH (ref 70–99)
Potassium: 4.4 mmol/L (ref 3.5–5.1)
Sodium: 135 mmol/L (ref 135–145)

## 2024-03-16 LAB — RESPIRATORY PANEL BY PCR

## 2024-03-16 LAB — CBC
HCT: 32.4 % — ABNORMAL LOW (ref 36.0–46.0)
Hemoglobin: 10.3 g/dL — ABNORMAL LOW (ref 12.0–15.0)
MCH: 30.7 pg (ref 26.0–34.0)
MCHC: 31.8 g/dL (ref 30.0–36.0)
MCV: 96.4 fL (ref 80.0–100.0)
Platelets: 52 K/uL — ABNORMAL LOW (ref 150–400)
RBC: 3.36 MIL/uL — ABNORMAL LOW (ref 3.87–5.11)
RDW: 13.9 % (ref 11.5–15.5)
WBC: 8 K/uL (ref 4.0–10.5)
nRBC: 0 % (ref 0.0–0.2)

## 2024-03-16 LAB — ECHOCARDIOGRAM COMPLETE
AR max vel: 1.13 cm2
AV Area VTI: 1.52 cm2
AV Area mean vel: 1.12 cm2
AV Mean grad: 13 mmHg
AV Peak grad: 23.2 mmHg
Ao pk vel: 2.41 m/s
Area-P 1/2: 3.16 cm2
S' Lateral: 3.2 cm

## 2024-03-16 MED ORDER — DICLOFENAC SODIUM 1 % EX GEL
2.0000 g | Freq: Four times a day (QID) | CUTANEOUS | Status: DC | PRN
  Administered 2024-03-18 – 2024-03-20 (×3): 2 g via TOPICAL
  Filled 2024-03-16: qty 100

## 2024-03-16 MED ORDER — PERFLUTREN LIPID MICROSPHERE
1.0000 mL | INTRAVENOUS | Status: AC | PRN
  Administered 2024-03-16: 4 mL via INTRAVENOUS

## 2024-03-16 MED ORDER — APIXABAN 5 MG PO TABS
5.0000 mg | ORAL_TABLET | Freq: Two times a day (BID) | ORAL | Status: DC
  Administered 2024-03-16 – 2024-03-26 (×20): 5 mg via ORAL
  Filled 2024-03-16 (×20): qty 1

## 2024-03-16 MED ORDER — ARFORMOTEROL TARTRATE 15 MCG/2ML IN NEBU
15.0000 ug | INHALATION_SOLUTION | Freq: Two times a day (BID) | RESPIRATORY_TRACT | Status: DC
  Administered 2024-03-17 – 2024-03-26 (×18): 15 ug via RESPIRATORY_TRACT
  Filled 2024-03-16 (×20): qty 2

## 2024-03-16 MED ORDER — REVEFENACIN 175 MCG/3ML IN SOLN
175.0000 ug | Freq: Every day | RESPIRATORY_TRACT | Status: DC
  Administered 2024-03-16 – 2024-03-26 (×11): 175 ug via RESPIRATORY_TRACT
  Filled 2024-03-16 (×11): qty 3

## 2024-03-16 NOTE — ED Notes (Signed)
 Patient assisted with eating lunch. Tylenol  provided for knee pain. No further needs expressed.

## 2024-03-16 NOTE — ED Notes (Signed)
 Patient assisted with eating breakfast. No additional needs expressed.

## 2024-03-16 NOTE — Evaluation (Signed)
 Physical Therapy Evaluation Patient Details Name: Danielle [name] MRN: [id number] DOB: 27-Jul-1945 Today's Date: 03/16/2024  History of Present Illness  79 y.o. female presents to [facility] hospital on 03/16/2023 with SOB and wheezing. PMH includes dementia, COPD, aortic valve replacement, HFrEF, PAF, CVA, CKD, hyperthyroidism, ITP, breast CA.  Clinical Impression  Pt presents to PT with deficits in functional mobility ,ROM, strength, power, balance, endurance, cognition. Pt is anxious and very fearful of falling. Pt has significant R knee pain and ROM deficits which prevent attempts at transferring currently. Pt requires significant physical assistance for bed mobility and remains at a high risk for falls. Patient will benefit from continued inpatient follow up therapy, <3 hours/day.        If plan is discharge home, recommend the following: Two people to help with walking and/or transfers;Two people to help with bathing/dressing/bathroom;Assistance with cooking/housework;Assistance with feeding;Direct supervision/assist for medications management;Direct supervision/assist for financial management;Assist for transportation;Help with stairs or ramp for entrance;Supervision due to cognitive status   Can travel by private vehicle   No    Equipment Recommendations Hoyer lift  Recommendations for Other Services       Functional Status Assessment Patient has had a recent decline in their functional status and demonstrates the ability to make significant improvements in function in a reasonable and predictable amount of time.     Precautions / Restrictions Precautions Precautions: Fall Recall of Precautions/Restrictions: Impaired Restrictions Weight Bearing Restrictions Per Provider Order: No      Mobility  Bed Mobility Overal bed mobility: Needs Assistance Bed Mobility: Supine to Sit, Sit to Supine     Supine to sit: Max assist, +2 for physical assistance, HOB elevated Sit to  supine: Total assist, +2 for physical assistance        Transfers Overall transfer level:  (deferred attempts due to weakness, pain and fear of falling)                      Ambulation/Gait                  Stairs            Wheelchair Mobility     Tilt Bed    Modified Rankin (Stroke Patients Only)       Balance Overall balance assessment: Needs assistance Sitting-balance support: Bilateral upper extremity supported, Feet unsupported Sitting balance-Leahy Scale: Poor Sitting balance - Comments: posterior lean, CGA-modA Postural control: Posterior lean                                   Pertinent Vitals/Pain Pain Assessment Pain Assessment: Faces Faces Pain Scale: Hurts whole lot Pain Location: back and R knee Pain Descriptors / Indicators: Aching Pain Intervention(s): Limited activity within patient's tolerance    Home Living Family/patient expects to be discharged to:: Private residence Living Arrangements: Other relatives (brother Danielle [name]) Available Help at Discharge: Family;Available PRN/intermittently Type of Home: House Home Access: Level entry       Home Layout: One level Home Equipment: Rollator (4 wheels);Wheelchair - manual;BSC/3in1 Additional Comments: Brother works so pt is home alone at times. History obtained from recent admission due to pt's cognitive deficits    Prior Function Prior Level of Function : Needs assist             Mobility Comments: pt reports she has been needing a lot of help to transfer and has had multiple  recent falls. She worries she is placing to much stress on her caregiver. At baseline pt transfers to wheelchair with assistance ADLs Comments: States her brother helps her sometimes with dressing. Sponge bathes. Vague about if she is able to toilet on her own at times     Extremity/Trunk Assessment   Upper Extremity Assessment Upper Extremity Assessment: Generalized weakness (RUE  shoulder flexion limited to ~100 degrees)    Lower Extremity Assessment Lower Extremity Assessment: Generalized weakness (pt reporting significant R knee pain with AROM, limited assessment due to pain. 3-/5 knee flexion/extension, 4-/5 ankle PF/DF)    Cervical / Trunk Assessment Cervical / Trunk Assessment: Kyphotic  Communication   Communication Communication: No apparent difficulties    Cognition Arousal: Alert Behavior During Therapy: Anxious   PT - Cognitive impairments: History of cognitive impairments, Orientation, Awareness, Memory, Attention, Initiation, Sequencing, Problem solving, Safety/Judgement   Orientation impairments: Place, Time, Situation (pt reports she is 88 instead of 78)                   PT - Cognition Comments: Pt very fearful of falling. Following commands: Impaired Following commands impaired: Follows one step commands with increased time, Follows multi-step commands inconsistently     Cueing Cueing Techniques: Verbal cues, Tactile cues, Visual cues     General Comments General comments (skin integrity, edema, etc.): VSS on RA    Exercises     Assessment/Plan    PT Assessment Patient needs continued PT services  PT Problem List Decreased strength;Decreased range of motion;Decreased activity tolerance;Decreased balance;Decreased mobility;Decreased cognition;Decreased knowledge of use of DME;Decreased safety awareness;Decreased knowledge of precautions;Pain       PT Treatment Interventions DME instruction;Therapeutic activities;Functional mobility training;Therapeutic exercise;Balance training;Neuromuscular re-education;Patient/family education;Wheelchair mobility training;Manual techniques    PT Goals (Current goals can be found in the Care Plan section)  Acute Rehab PT Goals Patient Stated Goal: to reduce risk for falls PT Goal Formulation: With patient Time For Goal Achievement: 03/30/24 Potential to Achieve Goals: Poor     Frequency Min 1X/week     Co-evaluation PT/OT/SLP Co-Evaluation/Treatment: Yes Reason for Co-Treatment: Complexity of the patient's impairments (multi-system involvement);Necessary to address cognition/behavior during functional activity;For patient/therapist safety PT goals addressed during session: Mobility/safety with mobility;Balance;Strengthening/ROM         AM-PAC PT 6 Clicks Mobility  Outcome Measure Help needed turning from your back to your side while in a flat bed without using bedrails?: A Lot Help needed moving from lying on your back to sitting on the side of a flat bed without using bedrails?: Total Help needed moving to and from a bed to a chair (including a wheelchair)?: Total Help needed standing up from a chair using your arms (e.g., wheelchair or bedside chair)?: Total Help needed to walk in hospital room?: Total Help needed climbing 3-5 steps with a railing? : Total 6 Click Score: 7    End of Session   Activity Tolerance: Patient limited by pain Patient left: in bed;with call bell/phone within reach;with bed alarm set Nurse Communication: Mobility status;Need for lift equipment PT Visit Diagnosis: Other abnormalities of gait and mobility (R26.89);Muscle weakness (generalized) (M62.81);History of falling (Z91.81);Difficulty in walking, not elsewhere classified (R26.2);Pain Pain - Right/Left: Right Pain - part of body: Knee    Time: 1340-1403 PT Time Calculation (min) (ACUTE ONLY): 23 min   Charges:   PT Evaluation $PT Eval Low Complexity: 1 Low   PT General Charges $$ ACUTE PT VISIT: 1 Visit  Bernardino JINNY Ruth, PT, DPT Acute Rehabilitation Office [phone]   Bernardino JINNY Ruth 03/16/2024, 2:51 PM

## 2024-03-16 NOTE — Evaluation (Signed)
 Occupational Therapy Evaluation Patient Details Name: Danielle [name] MRN: [id number] DOB: January 26, 1946 Today's Date: 03/16/2024   History of Present Illness   79 y.o. female presents to [facility] hospital on 03/16/2023 with SOB and wheezing. PMH includes dementia, COPD, aortic valve replacement, HFrEF, PAF, CVA, CKD, hyperthyroidism, ITP, breast CA.     Clinical Impressions Pt reports inability to walk at home and requiring increased assistance for transfers. She has assistance for ADLs and IADLs by her brother and an aide, although unable to state how many hours the aide is there. Pt presents with generalized weakness, R knee pain, poor sitting balance, fear of falling and impaired cognition (likely baseline). Pt requires +2 max to total assist for bed mobility on stretcher. She requires min to total assist for ADLs. Patient will benefit from continued inpatient follow up therapy, <3 hours/day.      If plan is discharge home, recommend the following:   Two people to help with walking and/or transfers;A lot of help with bathing/dressing/bathroom;Assistance with cooking/housework;Assistance with feeding;Direct supervision/assist for medications management;Direct supervision/assist for financial management;Assist for transportation;Help with stairs or ramp for entrance     Functional Status Assessment   Patient has had a recent decline in their functional status and demonstrates the ability to make significant improvements in function in a reasonable and predictable amount of time.     Equipment Recommendations   [employer];Hospital bed     Recommendations for Other Services         Precautions/Restrictions   Precautions Precautions: Fall Recall of Precautions/Restrictions: Impaired Precaution/Restrictions Comments: fearful of falling Restrictions Weight Bearing Restrictions Per Provider Order: No     Mobility Bed Mobility Overal bed mobility: Needs  Assistance Bed Mobility: Supine to Sit, Sit to Supine     Supine to sit: Max assist, +2 for physical assistance, HOB elevated Sit to supine: Total assist, +2 for physical assistance   General bed mobility comments: assist for all aspects, fearful of falling off stretcher    Transfers                   General transfer comment: did not attempt from stretcher      Balance Overall balance assessment: Needs assistance   Sitting balance-Leahy Scale: Poor Sitting balance - Comments: posterior lean, CGA-modA Postural control: Posterior lean                                 ADL either performed or assessed with clinical judgement   ADL Overall ADL's : Needs assistance/impaired Eating/Feeding: Set up;Sitting   Grooming: Minimal assistance;Sitting   Upper Body Bathing: Moderate assistance;Sitting   Lower Body Bathing: Total assistance;+2 for physical assistance;Bed level   Upper Body Dressing : Moderate assistance;Sitting   Lower Body Dressing: Total assistance;Bed level       Toileting- Clothing Manipulation and Hygiene: Total assistance;+2 for physical assistance;Bed level         General ADL Comments: Pt reports she cannot walk, transfers only.     Vision Ability to See in Adequate Light: 0 Adequate Patient Visual Report: No change from baseline       Perception Perception: Not tested       Praxis Praxis: Not tested       Pertinent Vitals/Pain Pain Assessment Pain Assessment: Faces Faces Pain Scale: Hurts whole lot Pain Location: back and R knee Pain Descriptors / Indicators: Aching Pain Intervention(s): Monitored during session, Repositioned  Extremity/Trunk Assessment Upper Extremity Assessment Upper Extremity Assessment: Right hand dominant;RUE deficits/detail;LUE deficits/detail RUE Deficits / Details: longstanding shoulder limitations with pain, would not allow PROM RUE Coordination: decreased gross motor LUE Deficits /  Details: longstanding shoulder limitations, passively ranged to 100 degrees FF LUE Coordination: decreased gross motor   Lower Extremity Assessment Lower Extremity Assessment: Defer to PT evaluation   Cervical / Trunk Assessment Cervical / Trunk Assessment: Kyphotic (weakness)   Communication Communication Communication: No apparent difficulties   Cognition Arousal: Alert Behavior During Therapy: Anxious Cognition: No family/caregiver present to determine baseline             OT - Cognition Comments: likely baseline                 Following commands: Impaired Following commands impaired: Follows one step commands with increased time, Follows multi-step commands inconsistently     Cueing  General Comments   Cueing Techniques: Verbal cues;Tactile cues;Visual cues  VSS on RA   Exercises     Shoulder Instructions      Home Living Family/patient expects to be discharged to:: Private residence Living Arrangements: Other relatives (brother) Available Help at Discharge: Family;Available PRN/intermittently;Personal care attendant Type of Home: House Home Access: Level entry     Home Layout: One level     Bathroom Shower/Tub: Sponge bathes at baseline         Home Equipment: Rollator (4 wheels);Wheelchair - manual;BSC/3in1   Additional Comments: brother works, has an engineer, production who assists with ADLs, unclear how many hours      Prior Functioning/Environment Prior Level of Function : Needs assist             Mobility Comments: pt reports she has been needing a lot of help to transfer and has had multiple recent falls. She worries she is placing to much stress on her caregiver. At baseline pt transfers to wheelchair with assistance ADLs Comments: States her brother helps her sometimes with dressing. Sponge bathes. Vague about if she is able to toilet on her own at times    OT Problem List: Decreased strength;Decreased activity tolerance;Impaired balance  (sitting and/or standing);Decreased cognition;Decreased coordination;Decreased knowledge of use of DME or AE;Obesity;Pain;Impaired UE functional use   OT Treatment/Interventions: Self-care/ADL training;Therapeutic activities;Patient/family education;Balance training;DME and/or AE instruction      OT Goals(Current goals can be found in the care plan section)   Acute Rehab OT Goals OT Goal Formulation: With patient Time For Goal Achievement: 03/30/24 Potential to Achieve Goals: Fair ADL Goals Pt Will Perform Grooming: with supervision;sitting Pt Will Perform Upper Body Bathing: with min assist;sitting Pt Will Transfer to Toilet: with mod assist;stand pivot transfer;bedside commode Additional ADL Goal #1: Pt will demonstrate fair sitting balance at EOB x 10 minutes as an ADL precursor. Additional ADL Goal #2: Pt will perform bed mobility with mod assist in preparation for ADLs.   OT Frequency:  Min 1X/week    Co-evaluation   Reason for Co-Treatment: Complexity of the patient's impairments (multi-system involvement);Necessary to address cognition/behavior during functional activity;For patient/therapist safety PT goals addressed during session: Mobility/safety with mobility;Balance;Strengthening/ROM        AM-PAC OT 6 Clicks Daily Activity     Outcome Measure Help from another person eating meals?: A Little Help from another person taking care of personal grooming?: A Little Help from another person toileting, which includes using toliet, bedpan, or urinal?: Total Help from another person bathing (including washing, rinsing, drying)?: A Lot Help from another person to put  on and taking off regular upper body clothing?: A Lot Help from another person to put on and taking off regular lower body clothing?: Total 6 Click Score: 12   End of Session    Activity Tolerance: Patient tolerated treatment well Patient left: in bed;with call bell/phone within reach  OT Visit Diagnosis:  Muscle weakness (generalized) (M62.81);Other symptoms and signs involving cognitive function;Pain Pain - Right/Left: Right Pain - part of body: Knee                Time: 1349-1403 OT Time Calculation (min): 14 min Charges:  OT General Charges $OT Visit: 1 Visit OT Evaluation $OT Eval Moderate Complexity: 1 Mod  Mliss HERO, OTR/L Acute Rehabilitation Services Office: [phone]   Kennth Mliss Helling 03/16/2024, 3:16 PM

## 2024-03-16 NOTE — [facility] Nurse Program (Signed)
 Following up on the progress on Danielle [name] plan of care. Danielle [name]

## 2024-03-16 NOTE — Progress Notes (Addendum)
 " PROGRESS NOTE    Danielle [name]  FMW:[id number] DOB: 09/09/1945 DOA: 03/15/2024 PCP: [name], [name], MD  Outpatient Specialists:     Brief Narrative:  Patient is a 79 year old female past medical history significant for documented COPD, dementia,aortic insufficiency status post bioprosthetic aortic valve placement in 2016, HFrEF 40 to 45%, paroxysmal atrial fibrillation on Eliquis , prior CVA x 2, CKD 3B, hyperthyroidism, ITP-chronically low platelet count, and history of breast cancer treated with lumpectomy radiation.  Due to patient's dementing illness, patient is unable to give significant history.  Patient is documented to have been admitted with shortness of breath.  As per H&P, EMS reported audible wheezing  03/16/2024, outpatient seen.  Patient is unable to give any significant history.  Patient has dementing illness.  Patient has audible wheezing.  Suspect likely vocal cord dysfunction.  Will consult the pulmonary team to assist with patient's management.   Assessment & Plan:   Principal Problem:   COPD with acute exacerbation (HCC) Active Problems:   Paroxysmal atrial fibrillation (HCC)   Hyperthyroidism   S/P aortic valve replacement with bioprosthetic valve   Acute kidney injury superimposed on stage 3b chronic kidney disease (HCC)   Acute on chronic combined systolic and diastolic CHF (congestive heart failure) (HCC)   History of aortic insufficiency   History of ITP   History of breast cancer   History of dementia   GAD (generalized anxiety disorder)   Vocal cord dysfunction: -Patient has audible wheeze. - Most of the wheezing has come from the upper airway. - Will consult the pulmonary team.  COPD with acute exacerbation documented: - Significant air entry. - No significant expiratory wheeze. - Will await pulmonary team. - Meanwhile, we will continue current management for now.  Chronic kidney disease stage IIIb versus acute on chronic kidney  disease stage IIIa: - Baseline serum creatinine of 1.24-1.3. - Serum creatinine of 1.59 on presentation. - Serum creatinine of 1.4 today, 03/16/2024 - Suspect patient has baseline chronic kidney disease stage IIIb.  Paroxysmal A-fib -Continue Coreg  25 mg twice daily and Eliquis  5 mg twice daily.     Hyperthyroidism - Continue methimazole  5 mg p.o. 3 times daily   History of chronic anemia - Will globin of 10.2 g/dL and MCV of 67.5. -MCV of 96.4.     History of ITP -Platelet of 52.   - Continue to monitor closely.   - Patient is on Solu-Medrol  IV.   Generalized anxiety disorder: -Stable.  History of dementia: - No behavioral problems.   Recent fall History of left femoral neck fracture status post ORIF As per prior documentation: - Patient experienced recent fall 1/13.   -Imaging from 1/13 bilateral knees showing severe osteoarthritis.  No dislocation or fracture.  Chest x-ray no acute cardiopulmonary process.  CT head no acute intracranial abnormality.  CT cervical spine no evidence of fracture.  CT pelvis no acute osseous process.  Status post ORIF of the left femoral neck fracture.  CT lumbar spine no evidence of fracture. -Since the fall patient reported that she has been hurting all over specifically bilateral knees.  Underlying history of dementia and high risk for fall.  Consulting inpatient PT and OT for further evaluation of balance.  Giving tramadol  50 mg 3 times daily prn for moderate/severe pain control . 03/16/2024: Will avoid tramadol .     Hyperlipidemia - Continue Crestor   DVT prophylaxis: Apixaban . Code Status: Full code. Family Communication: None present. Disposition Plan: This will depend on hospital course.  Consultants:  Pulmonary team consulted.  Procedures:  None.  Antimicrobials:  Azithromycin  500 mg p.o. once daily.   Subjective: Patient is unable to give any history.  Objective: Vitals:   03/16/24 1104 03/16/24 1315 03/16/24 1330  03/16/24 1529  BP:  91/60 100/81   Pulse:  84 82   Resp:  20 (!) 22   Temp: 97.7 F (36.5 C)   97.7 F (36.5 C)  TempSrc: Oral   Oral  SpO2:  100% 100%     Intake/Output Summary (Last 24 hours) at 03/16/2024 1603 Last data filed at 03/15/2024 2252 Gross per 24 hour  Intake --  Output 650 ml  Net -650 ml   There were no vitals filed for this visit.  Examination:  General exam: Appears calm and comfortable  Respiratory system: Audible wheeze.  Most of the wheezing from the neck area.  Adequate air entry. Cardiovascular system: S1 & S2 heard Gastrointestinal system: Abdomen is soft and nontender.  Central nervous system: Patient has dementia.  Extremities: Fullness of the ankle.    Data Reviewed: I have personally reviewed following labs and imaging studies  CBC: Recent Labs  Lab 03/13/24 1412 03/15/24 1721 03/16/24 0030  WBC 9.4 8.3 8.0  NEUTROABS 7.6 6.7  --   HGB 10.8* 9.9* 10.3*  HCT 33.2* 30.3* 32.4*  MCV 94.3 95.6 96.4  PLT 37* 40* 52*   Basic Metabolic Panel: Recent Labs  Lab 03/13/24 1543 03/15/24 1721 03/16/24 0030  NA 138 137 135  K 4.1 4.3 4.4  CL 106 107 104  CO2 21* 21* 20*  GLUCOSE 103* 122* 177*  BUN 13 24* 24*  CREATININE 1.30* 1.59* 1.40*  CALCIUM  9.4 9.3 9.6   GFR: CrCl cannot be calculated (Unknown ideal weight.). Liver Function Tests: Recent Labs  Lab 03/15/24 1721  AST 66*  ALT 47*  ALKPHOS 98  BILITOT 0.7  PROT 6.0*  ALBUMIN  2.9*   No results for input(s): LIPASE, AMYLASE in the last 168 hours. No results for input(s): AMMONIA in the last 168 hours. Coagulation Profile: No results for input(s): INR, PROTIME in the last 168 hours. Cardiac Enzymes: No results for input(s): CKTOTAL, CKMB, CKMBINDEX, TROPONINI in the last 168 hours. BNP (last 3 results) Recent Labs    02/14/24 2039 03/15/24 1721  PROBNP 834.0* 2,743.0*   HbA1C: No results for input(s): HGBA1C in the last 72 hours. CBG: No  results for input(s): GLUCAP in the last 168 hours. Lipid Profile: No results for input(s): CHOL, HDL, LDLCALC, TRIG, CHOLHDL, LDLDIRECT in the last 72 hours. Thyroid  Function Tests: No results for input(s): TSH, T4TOTAL, FREET4, T3FREE, THYROIDAB in the last 72 hours. Anemia Panel: No results for input(s): VITAMINB12, FOLATE, FERRITIN, TIBC, IRON, RETICCTPCT in the last 72 hours. Urine analysis:    Component Value Date/Time   COLORURINE YELLOW 03/15/2024 2210   APPEARANCEUR CLEAR 03/15/2024 2210   LABSPEC 1.010 03/15/2024 2210   PHURINE 6.0 03/15/2024 2210   GLUCOSEU NEGATIVE 03/15/2024 2210   GLUCOSEU 500 (A) 12/08/2020 0926   HGBUR NEGATIVE 03/15/2024 2210   BILIRUBINUR NEGATIVE 03/15/2024 2210   KETONESUR NEGATIVE 03/15/2024 2210   PROTEINUR NEGATIVE 03/15/2024 2210   UROBILINOGEN 0.2 04/18/2021 1411   NITRITE NEGATIVE 03/15/2024 2210   LEUKOCYTESUR NEGATIVE 03/15/2024 2210   Sepsis Labs: @LABRCNTIP (procalcitonin:4,lacticidven:4)  ) Recent Results (from the past 240 hours)  Resp panel by RT-PCR (RSV, Flu A&B, Covid) Anterior Nasal Swab     Status: None   Collection Time: 03/15/24  4:18  PM   Specimen: Anterior Nasal Swab  Result Value Ref Range Status   SARS Coronavirus 2 by RT PCR NEGATIVE NEGATIVE Final   Influenza A by PCR NEGATIVE NEGATIVE Final   Influenza B by PCR NEGATIVE NEGATIVE Final    Comment: (NOTE) The Xpert Xpress SARS-CoV-2/FLU/RSV plus assay is intended as an aid in the diagnosis of influenza from Nasopharyngeal swab specimens and should not be used as a sole basis for treatment. Nasal washings and aspirates are unacceptable for Xpert Xpress SARS-CoV-2/FLU/RSV testing.  Fact Sheet for Patients: bloggercourse.com  Fact Sheet for Healthcare Providers: seriousbroker.it  This test is not yet approved or cleared by the [place]  FDA and has been authorized for  detection and/or diagnosis of SARS-CoV-2 by FDA under an Emergency Use Authorization (EUA). This EUA will remain in effect (meaning this test can be used) for the duration of the COVID-19 declaration under Section 564(b)(1) of the Act, 21 U.S.C. section 360bbb-3(b)(1), unless the authorization is terminated or revoked.     Resp Syncytial Virus by PCR NEGATIVE NEGATIVE Final    Comment: (NOTE) Fact Sheet for Patients: bloggercourse.com  Fact Sheet for Healthcare Providers: seriousbroker.it  This test is not yet approved or cleared by the [place]  FDA and has been authorized for detection and/or diagnosis of SARS-CoV-2 by FDA under an Emergency Use Authorization (EUA). This EUA will remain in effect (meaning this test can be used) for the duration of the COVID-19 declaration under Section 564(b)(1) of the Act, 21 U.S.C. section 360bbb-3(b)(1), unless the authorization is terminated or revoked.  Performed at [facility], [address], [place], [place] [address]          Radiology Studies: ECHOCARDIOGRAM COMPLETE Result Date: 03/16/2024    ECHOCARDIOGRAM REPORT   Patient Name:   Danielle [name] Date of Exam: 03/16/2024 Medical Rec #:  [id number]                   Height:       67.0 in Accession #:    [id number]                  Weight:       224.9 lb Date of Birth:  Apr 07, 1945                  BSA:          2.126 m Patient Age:    11 years                    BP:           108/75 mmHg Patient Gender: F                           HR:           80 bpm. Exam Location:  Inpatient Procedure: 2D Echo, Cardiac Doppler, Color Doppler and Intracardiac            Opacification Agent (Both Spectral and Color Flow Doppler were            utilized during procedure). Indications:    CHF-acute systolic 150.21  History:        Patient has prior history of Echocardiogram examinations, most                 recent 12/28/2022. COPD;  Risk Factors:Hypertension, Dyslipidemia  and Former Smoker.                 Aortic Valve: 23 mm St. Jude bioprosthetic valve is present in                 the aortic position. Procedure Date: 03/12/2014.  Sonographer:    Merlynn Argyle Referring Phys: [id number] SUBRINA SUNDIL  Sonographer Comments: Technically difficult study due to poor echo windows and no subcostal window. Image acquisition challenging due to patient body habitus, Image acquisition challenging due to COPD and Image acquisition challenging due to respiratory motion. IMPRESSIONS  1. Extremely poor acoustic windows Definity  sues. LVEF appears mildly depres with hypokinesis of distal and apical segments. With Definity  use, there is a filling defect= that is consistent with thrombus (Images 94 96, 106), Poor acoustic windows limit evaluation. . Left ventricular ejection fraction, by estimation, is 40 to 45%. The left ventricle has mildly decreased function.  2. Right ventricular systolic function is normal. The right ventricular size is normal.  3. The mitral valve is normal in structure. Trivial mitral valve regurgitation.  4. S/p AVR (23 mm St Jude bioprostheis; procedure dated 03/12/14). Peak and mean gradients through the valve are 23 and 13 mm HG respectively.. The aortic valve has been repaired/replaced. Aortic valve regurgitation is not visualized. There is a 23 mm St. Jude bioprosthetic valve present in the aortic position. Procedure Date: 03/12/2014. FINDINGS  Left Ventricle: Extremely poor acoustic windows Definity  sues. LVEF appears mildly depres with hypokinesis of distal and apical segments. With Definity  use, there is a filling defect= that is consistent with thrombus (Images 94 96, 106), Poor acoustic windows limit evaluation. Left ventricular ejection fraction, by estimation, is 40 to 45%. The left ventricle has mildly decreased function. Definity  contrast agent was given IV to delineate the left ventricular endocardial borders.  The left ventricular internal cavity size was normal in size. There is no left ventricular hypertrophy. Right Ventricle: The right ventricular size is normal. Right vetricular wall thickness was not assessed. Right ventricular systolic function is normal. Left Atrium: Left atrial size was normal in size. Right Atrium: Right atrial size was normal in size. Pericardium: Trivial pericardial effusion is present. Mitral Valve: The mitral valve is normal in structure. Trivial mitral valve regurgitation. Tricuspid Valve: The tricuspid valve is not well visualized. Tricuspid valve regurgitation is trivial. Aortic Valve: S/p AVR (23 mm St Jude bioprostheis; procedure dated 03/12/14). Peak and mean gradients through the valve are 23 and 13 mm HG respectively. The aortic valve has been repaired/replaced. Aortic valve regurgitation is not visualized. Aortic valve mean gradient measures 13.0 mmHg. Aortic valve peak gradient measures 23.2 mmHg. Aortic valve area, by VTI measures 1.52 cm. There is a 23 mm St. Jude bioprosthetic valve present in the aortic position. Procedure Date: 03/12/2014. Pulmonic Valve: The pulmonic valve was not well visualized. Pulmonic valve regurgitation is trivial. Aorta: The aortic root is normal in size and structure. IAS/Shunts: The interatrial septum was not assessed.  LEFT VENTRICLE PLAX 2D LVIDd:         4.10 cm   Diastology LVIDs:         3.20 cm   LV e' medial:    3.37 cm/s LV PW:         1.30 cm   LV E/e' medial:  14.8 LV IVS:        1.10 cm   LV e' lateral:   7.07 cm/s LVOT diam:     2.20 cm  LV E/e' lateral: 7.0 LV SV:         64 LV SV Index:   30 LVOT Area:     3.80 cm  AORTIC VALVE AV Area (Vmax):    1.13 cm AV Area (Vmean):   1.12 cm AV Area (VTI):     1.52 cm AV Vmax:           241.00 cm/s AV Vmean:          170.000 cm/s AV VTI:            0.423 m AV Peak Grad:      23.2 mmHg AV Mean Grad:      13.0 mmHg LVOT Vmax:         71.85 cm/s LVOT Vmean:        50.100 cm/s LVOT VTI:           0.170 m LVOT/AV VTI ratio: 0.40  AORTA Ao Root diam: 3.50 cm Ao Asc diam:  3.30 cm MITRAL VALVE MV Area (PHT): 3.16 cm     SHUNTS MV Decel Time: 240 msec     Systemic VTI:  0.17 m MV E velocity: 49.80 cm/s   Systemic Diam: 2.20 cm MV A velocity: 106.00 cm/s MV E/A ratio:  0.47 Vina Gull MD Electronically signed by Vina Gull MD Signature Date/Time: 03/16/2024/3:38:59 PM    Final    DG Chest Port 1 View Result Date: 03/15/2024 CLINICAL DATA:  Shortness of breath, audible wheezing EXAM: PORTABLE CHEST 1 VIEW COMPARISON:  03/13/2024 FINDINGS: Single frontal view of the chest demonstrates an unremarkable cardiac silhouette. Stable chronic interstitial prominence most consistent with background emphysema and scarring. No acute airspace disease, effusion, or pneumothorax. No acute bony abnormalities. IMPRESSION: 1. Stable chest, no acute process. Electronically Signed   By: Ozell Daring M.D.   On: 03/15/2024 16:43        Scheduled Meds:  apixaban   5 mg Oral BID   azithromycin   500 mg Oral Daily   carvedilol   25 mg Oral BID WC   furosemide   20 mg Intravenous Daily   ipratropium-albuterol   3 mL Nebulization Q6H   melatonin  5 mg Oral QHS   methimazole   5 mg Oral TID   methylPREDNISolone  (SOLU-MEDROL ) injection  40 mg Intravenous Daily   pantoprazole   40 mg Oral Daily   rosuvastatin   20 mg Oral Daily   sertraline   50 mg Oral Daily   sodium chloride  flush  3 mL Intravenous Q12H   Continuous Infusions:  sodium chloride        LOS: 1 day    Time spent: 55 minutes.    Leatrice Chapel, MD  [facility] 7PM-7AM contact night coverage as above    "

## 2024-03-16 NOTE — Progress Notes (Signed)
 Heart Failure Navigator Progress Note  Assessed for Heart & Vascular TOC clinic readiness.  Patient does not meet criteria due to established with AHF clinic, patient of Dr. Rolan.   Will sign off.   Duwaine Plant, PharmD, BCPS Heart Failure Stewardship Pharmacist Phone [phone]

## 2024-03-16 NOTE — Progress Notes (Signed)
 Echocardiogram 2D Echocardiogram has been performed.  Danielle [name] 03/16/2024, 3:17 PM

## 2024-03-16 NOTE — Consult Note (Addendum)
 "  NAME:  Danielle [name], MRN:  [id number], DOB:  1945-12-01, LOS: 1 ADMISSION DATE:  03/15/2024, CONSULTATION DATE:  1/16 REFERRING MD:  Azucena, CHIEF COMPLAINT:  wheezing, concern for VC issue  History of Present Illness:  [name] is a 79 y/o woman with a history of dementia, asthma since childhood (cannot find this in her chart), AS s/p AVR, NICM, breast cancer who She reports that she presented for SOB, dry cough for a long time. She does not think it got worse in the days preceeding admission. She uses albuterol  at home, but no scheduled inhalers. She is a nonsmoker. Per EMS, the 2 duonebs she received in transport improved her symptoms. In the ED she received multiple additional nebs. She was started on azithromycin  at admission.  She was started on steroids today. Today PCCM was consulted for wheezing and vocal cord concerns.   On 1/13 presented to the ED with a presented with a knee injury. When she was evaluated she was noticed to be wheezing, which resolved with a breathing treatment. She was discharged home that day. Lives at home with brother and has a caretaker.   Pertinent  Medical History  AS s/p bioprosthetic AVR pAF NICM Breast cancer Hyperthyroidism HTN HLD GERD CKD  Significant Hospital Events: Including procedures, antibiotic start and stop dates in addition to other pertinent events   1/15 nebs, azithromycin  1/16  steroids added  Interim History / Subjective:    Objective    Blood pressure 100/81, pulse 82, temperature 97.7 F (36.5 C), temperature source Oral, resp. rate (!) 22, SpO2 100%.        Intake/Output Summary (Last 24 hours) at 03/16/2024 1531 Last data filed at 03/15/2024 2252 Gross per 24 hour  Intake --  Output 650 ml  Net -650 ml   There were no vitals filed for this visit.  Examination: General: Chronically ill appearing woman sitting up in bed in NAD HENT: [place]/AT, eyes anicteric. Dentures, no pharyngeal  erythema.  Neck: no inspiratory sounds but coarse expiratory breath sounds  Lungs: Breathing comfortably on RA, no conversational dyspnea. + hoarseness. + expiratory wheezing, no accessory muscle use. Occasional coughing, noted after forced exhalation. Wheezing improves with open mouthed forced exhalation. Cardiovascular: S1S2, RRR Abdomen: soft, NT Extremities: no cyanosis or edema Neuro: Awake, alert, answering some questions but not able to give many details. Needs help sitting forward and has significant pain when sitting up.   Bicarb 20  BUN 24 Cr 1.4 WBC 8.0 H/H10.3/32.4 Platelets 52 Covid, flu, RSV negative CXR personally reviewed> increased interstitial markings, no obvious airway thickening. No lobar infiltrates.   Spiro,metry in 2018 reviewed> normal  Resolved problem list   Assessment and Plan   Wheezing, suspect acute obstructive lung disease flare- either asthma or COPD. Had normal spirometry in 2018. History not clear. Not requiring oxygen , no respiratory distress. Potential for wheezing related to viral infection. Without stridor, low suspicion that this is a fixed upper airway lesion. -scheduled bronchodilators -agree with steroids & azithromycin  -check RVP; with hoarseness  suspicion that this could be URI related -not requiring oxygen  -with asthma, wheezing may worsen before it improves with bronchodilators; will monitor -if she has VCD, the mainstay of treatment is aggressive treatment of obstructive lung disease, management of anxiety, working with SLP.  HTN -recommend finding an alternative Bblocker that is more B2 selective during an acute obstructive lung disease flare; will defer this to primary to decide   Labs   CBC: Recent Labs  Lab 03/13/24 1412 03/15/24 1721 03/16/24 0030  WBC 9.4 8.3 8.0  NEUTROABS 7.6 6.7  --   HGB 10.8* 9.9* 10.3*  HCT 33.2* 30.3* 32.4*  MCV 94.3 95.6 96.4  PLT 37* 40* 52*    Basic Metabolic Panel: Recent Labs  Lab  03/13/24 1543 03/15/24 1721 03/16/24 0030  NA 138 137 135  K 4.1 4.3 4.4  CL 106 107 104  CO2 21* 21* 20*  GLUCOSE 103* 122* 177*  BUN 13 24* 24*  CREATININE 1.30* 1.59* 1.40*  CALCIUM  9.4 9.3 9.6   GFR: CrCl cannot be calculated (Unknown ideal weight.). Recent Labs  Lab 03/13/24 1412 03/15/24 1721 03/16/24 0030  WBC 9.4 8.3 8.0    Liver Function Tests: Recent Labs  Lab 03/15/24 1721  AST 66*  ALT 47*  ALKPHOS 98  BILITOT 0.7  PROT 6.0*  ALBUMIN  2.9*   No results for input(s): LIPASE, AMYLASE in the last 168 hours. No results for input(s): AMMONIA in the last 168 hours.  ABG    Component Value Date/Time   PHART 7.426 03/10/2020 0800   PCO2ART 34.2 03/10/2020 0800   PO2ART 81 (L) 03/10/2020 0800   HCO3 23.2 03/10/2020 0801   TCO2 19 (L) 06/22/2023 2134   ACIDBASEDEF 2.0 03/10/2020 0801   O2SAT 74.0 03/10/2020 0801     Coagulation Profile: No results for input(s): INR, PROTIME in the last 168 hours.  Cardiac Enzymes: No results for input(s): CKTOTAL, CKMB, CKMBINDEX, TROPONINI in the last 168 hours.  HbA1C: Hemoglobin A1C  Date/Time Value Ref Range Status  05/04/2021 11:56 AM 5.2 4.0 - 5.6 % Final  12/08/2020 09:27 AM 5.1 4.0 - 5.6 % Final   Hgb A1c MFr Bld  Date/Time Value Ref Range Status  07/31/2020 10:48 AM 5.3 4.8 - 5.6 % Final    Comment:             Prediabetes: 5.7 - 6.4          Diabetes: >6.4          Glycemic control for adults with diabetes: <7.0   08/28/2018 05:25 PM 5.0 4.8 - 5.6 % Final    Comment:    (NOTE) Pre diabetes:          5.7%-6.4% Diabetes:              >6.4% Glycemic control for   <7.0% adults with diabetes     CBG: No results for input(s): GLUCAP in the last 168 hours.  Review of Systems:   Limited due to dementia  Past Medical History:  She,  has a past medical history of Anticoagulated on Coumadin , Anxiety, Chronic constipation, Chronic systolic CHF (congestive heart failure) (HCC)  (2016), CKD (chronic kidney disease), stage III (HCC), COPD (chronic obstructive pulmonary disease) (HCC), Coronary artery disease, DOE (dyspnea on exertion), GERD (gastroesophageal reflux disease), History of 2019 novel coronavirus disease (COVID-19) (12/19/2019), History of cerebrovascular accident (CVA) with residual deficit, History of gastritis, History of pneumothorax, Hyperlipidemia, Hypertension, Hyperthyroidism, Malignant neoplasm of upper-outer quadrant of left breast in female, estrogen receptor positive (HCC) (11/2016), Multiple thyroid  nodules, Nausea, vomiting, and diarrhea (12/25/2022), NICM (nonischemic cardiomyopathy) (HCC) (2016), OA (osteoarthritis), Osteoporosis, Paroxysmal atrial fibrillation (HCC), Personal history of radiation therapy, Thrombocytopenia, Valvular stenosis, aortic (03/12/2014), and Wears dentures.   Surgical History:   Past Surgical History:  Procedure Laterality Date   AORTIC VALVE REPLACEMENT  03/12/2014   [facility] in [place];  St Jude 23mm, medial Trifecta Bioprosthesis   BREAST  LUMPECTOMY WITH RADIOACTIVE SEED AND SENTINEL LYMPH NODE BIOPSY Left 01/14/2017   Procedure: LEFT BREAST LUMPECTOMY WITH RADIOACTIVE SEED AND SENTINEL LYMPH NODE BIOPSY;  Surgeon: [name], MD;  Location: [facility] OR;  Service: General;  Laterality: Left;   CARDIAC CATHETERIZATION  02/12/2014   [facility] in [place]   HYSTEROSCOPY WITH D & C N/A 11/17/2020   Procedure: DILATATION AND CURETTAGE /HYSTEROSCOPY WITH MYOSURE;  Surgeon: Cathlyn JAYSON Nikki Bobie FORBES, MD;  Location: [facility];  Service: Gynecology;  Laterality: N/A;   INTRAMEDULLARY (IM) NAIL INTERTROCHANTERIC Left 12/10/2015   Procedure: INTRAMEDULLARY (IM) NAIL INTERTROCHANTRIC;  Surgeon: Kay CHRISTELLA Cummins, MD;  Location: [facility] OR;  Service: Orthopedics;  Laterality: Left;   OPERATIVE ULTRASOUND N/A 11/17/2020   Procedure: OPERATIVE ULTRASOUND;  Surgeon: Cathlyn JAYSON Nikki Bobie FORBES, MD;  Location: [facility];  Service: Gynecology;  Laterality: N/A;   PLEURADESIS Left 12/03/2015   Procedure: PLEURADESIS;  Surgeon: Elspeth JAYSON Millers, MD;  Location: [facility] OR;  Service: Thoracic;  Laterality: Left;   RESECTION OF APICAL BLEB Left 12/03/2015   Procedure: BLEBECTOMY;  Surgeon: Elspeth JAYSON Millers, MD;  Location: [facility] OR;  Service: Thoracic;  Laterality: Left;   RIGHT/LEFT HEART CATH AND CORONARY ANGIOGRAPHY N/A 03/10/2020   Procedure: RIGHT/LEFT HEART CATH AND CORONARY ANGIOGRAPHY;  Surgeon: Mady Bruckner, MD;  Location: [facility] INVASIVE CV LAB;  Service: Cardiovascular;  Laterality: N/A;   VIDEO ASSISTED THORACOSCOPY Left 12/03/2015   Procedure: VIDEO ASSISTED THORACOSCOPY;  Surgeon: Elspeth JAYSON Millers, MD;  Location: [facility] OR;  Service: Thoracic;  Laterality: Left;   VIDEO ASSISTED THORACOSCOPY (VATS) W/TALC  PLEUADESIS Right 11/19/2014   in [place]     Social History:   reports that she quit smoking about 14 years ago. Her smoking use included cigarettes. She started smoking about 24 years ago. She has a 2.5 pack-year smoking history. She has never used smokeless tobacco. She reports that she does not drink alcohol and does not use drugs.   Family History:  Her family history includes Diabetes in her father, mother, sister, and sister; HIV in her brother; Heart attack (age of onset: 72) in her mother; Lung cancer in her father; Thyroid  disease in her sister. There is no history of Colon polyps or Colon cancer.   Allergies Allergies[1]   Home Medications  Prior to Admission medications  Medication Sig Start Date End Date Taking? Authorizing Provider  albuterol  (VENTOLIN  HFA) 108 (90 Base) MCG/ACT inhaler Inhale 2 puffs into the lungs every 4 (four) hours as needed for shortness of breath. 12/12/23  Yes [provider]  apixaban  (ELIQUIS ) 5 MG TABS tablet Take 1 tablet (5 mg total) by mouth 2 (two) times daily. 05/04/23  Yes Lee, [name], NP  losartan  (COZAAR ) 25 MG tablet  Take 1 tablet (25 mg total) by mouth daily. 12/13/23  Yes [name], [name], MD  melatonin 5 MG TABS Take 5 mg by mouth at bedtime.   Yes [provider]  trolamine salicylate (ASPERCREME) 10 % cream Apply 1 application topically as needed for muscle pain.   Yes [provider]  acetaminophen  (TYLENOL ) 325 MG tablet Take 2 tablets (650 mg total) by mouth every 6 (six) hours as needed for mild pain (pain score 1-3), fever or headache. 01/19/23   Danton Reyes DASEN, MD  alendronate  (FOSAMAX ) 70 MG tablet Take 70 mg by mouth once a week. Take with a full glass of water on an empty stomach.    [provider]  carvedilol  (COREG ) 25 MG tablet Take 1 tablet (25 mg total) by mouth 2 (two) times daily with a meal. 08/30/22   Glena Harlene HERO, FNP  Incontinence Supply Disposable MISC Adult diapers extra large/female 11/02/23   [name], [name], MD  methimazole  (TAPAZOLE ) 5 MG tablet TAKE 1 TABLET BY MOUTH 3 TIMES  DAILY 02/13/24   [name], [name], MD  omeprazole  (PRILOSEC) 40 MG capsule Take 40 mg by mouth daily. 12/06/23   [provider]  rosuvastatin  (CRESTOR ) 20 MG tablet TAKE 1 TABLET BY MOUTH ONCE  DAILY 02/13/24   Clegg, Amy D, NP  sertraline  (ZOLOFT ) 50 MG tablet Take 50 mg by mouth daily. 01/21/24   [provider]  spironolactone  (ALDACTONE ) 25 MG tablet TAKE 1 TABLET BY MOUTH DAILY 03/07/24   Lee, [name], NP     Critical care time: n/a     Leita SHAUNNA Gaskins, DO 03/16/24 3:31 PM [facility]  For contact information, see Amion. If no response to pager, please call PCCM consult pager. After hours, 7PM- 7AM, please call Elink.           [1]  Allergies Allergen Reactions   Tetanus Toxoid Adsorbed Swelling    Arm swelling   Zestril  [Lisinopril ] Cough   Peanut (Diagnostic) Cough   "

## 2024-03-17 DIAGNOSIS — Z953 Presence of xenogenic heart valve: Secondary | ICD-10-CM

## 2024-03-17 DIAGNOSIS — I5020 Unspecified systolic (congestive) heart failure: Secondary | ICD-10-CM

## 2024-03-17 DIAGNOSIS — I480 Paroxysmal atrial fibrillation: Secondary | ICD-10-CM

## 2024-03-17 DIAGNOSIS — I10 Essential (primary) hypertension: Secondary | ICD-10-CM

## 2024-03-17 DIAGNOSIS — R062 Wheezing: Secondary | ICD-10-CM

## 2024-03-17 LAB — BASIC METABOLIC PANEL WITH GFR
Anion gap: 9 (ref 5–15)
BUN: 35 mg/dL — ABNORMAL HIGH (ref 8–23)
CO2: 23 mmol/L (ref 22–32)
Calcium: 9.3 mg/dL (ref 8.9–10.3)
Chloride: 101 mmol/L (ref 98–111)
Creatinine, Ser: 1.31 mg/dL — ABNORMAL HIGH (ref 0.44–1.00)
GFR, Estimated: 42 mL/min — ABNORMAL LOW
Glucose, Bld: 128 mg/dL — ABNORMAL HIGH (ref 70–99)
Potassium: 4.6 mmol/L (ref 3.5–5.1)
Sodium: 132 mmol/L — ABNORMAL LOW (ref 135–145)

## 2024-03-17 LAB — MAGNESIUM: Magnesium: 2.2 mg/dL (ref 1.7–2.4)

## 2024-03-17 NOTE — Plan of Care (Signed)
   Problem: Health Behavior/Discharge Planning: Goal: Ability to manage health-related needs will improve Outcome: Progressing   Problem: Clinical Measurements: Goal: Ability to maintain clinical measurements within normal limits will improve Outcome: Progressing Goal: Will remain free from infection Outcome: Progressing Goal: Diagnostic test results will improve Outcome: Progressing Goal: Respiratory complications will improve Outcome: Progressing

## 2024-03-17 NOTE — NC FL2 (Signed)
 " [place]  MEDICAID FL2 LEVEL OF CARE FORM     IDENTIFICATION  Patient Name: Danielle [name] Birthdate: Sep 25, 1945 Sex: female Admission Date (Current Location): 03/15/2024  [facility] [place] Number:  Producer, Television/film/video and Address:  [facility], [address], [place], [place] [address]      Provider Number: [id number]  Attending Physician Name and Address:  Rosario Leatrice FERNS, MD  Relative Name and Phone Number:  Carney Faden   [phone]    Current Level of Care: Hospital Recommended Level of Care: Skilled Nursing Facility Prior Approval Number:    Date Approved/Denied:   PASRR Number: [id number]  Discharge Plan: SNF    Current Diagnoses: Patient Active Problem List   Diagnosis Date Noted   Wheezing 03/16/2024   Dementia (HCC) 03/16/2024   Acute on chronic combined systolic and diastolic CHF (congestive heart failure) (HCC) 03/15/2024   History of aortic insufficiency 03/15/2024   History of ITP 03/15/2024   History of breast cancer 03/15/2024   History of dementia 03/15/2024   GAD (generalized anxiety disorder) 03/15/2024   Neurodegenerative cognitive impairment 11/03/2023   Malignant neoplasm of cervix, unspecified site (HCC) 08/03/2023   Incontinence of urine in female 08/03/2023   Acute pulmonary embolism (HCC) 12/27/2022   SIRS (systemic inflammatory response syndrome) (HCC) 12/25/2022   Elevated troponin 12/25/2022   Acute kidney injury superimposed on stage 3b chronic kidney disease (HCC) 12/25/2022   Nausea, vomiting, and diarrhea 12/25/2022   Obesity (BMI 30-39.9) 12/25/2022   Gastroesophageal reflux disease 04/09/2022   Abdominal pain, periumbilical 04/09/2022   Chronic pain of both shoulders 12/17/2021   Depression, major, recurrent, moderate (HCC) 12/08/2020   Gait abnormality 07/31/2020   Paresthesia 07/31/2020   Low back pain with bilateral sciatica 07/31/2020   Pneumonia due to COVID-19 virus     Bacteremia due to Gram-positive bacteria 08/29/2018   Abscess    Cellulitis of left breast 08/28/2018   Cellulitis of left abdominal wall 08/28/2018   Routine general medical examination at a health care facility 04/28/2018   Stage 3b chronic kidney disease (HCC) 09/27/2017   Recurrent genital herpes 04/13/2017   Chronic pain disorder 01/25/2017   Malignant neoplasm of upper-outer quadrant of left breast in female, estrogen receptor positive (HCC) 12/16/2016   Chronic knee pain 12/07/2016   Chronic bilateral low back pain without sciatica 12/07/2016   Peripheral musculoskeletal gait disorder 12/07/2016   Encounter for therapeutic drug monitoring 06/16/2016   Generalized osteoarthritis of multiple sites 05/04/2016   Aortic valve disease 04/10/2016   S/P aortic valve replacement with bioprosthetic valve 04/10/2016   NICM (nonischemic cardiomyopathy) (HCC) 04/10/2016   Upper airway cough syndrome 03/14/2016   Class 2 severe obesity with serious comorbidity in adult 03/14/2016   COPD with acute exacerbation (HCC) 03/11/2016   Thrombocytopenia 12/12/2015   Anemia 12/12/2015   Vitamin D  deficiency 12/12/2015   Acute urinary retention 12/11/2015   Osteoporosis 12/11/2015   Hip fracture (HCC) 12/10/2015   Aortic atherosclerosis 12/10/2015   Lung blebs (HCC) 12/03/2015   Pelvic fracture (HCC) 08/19/2015   At risk for falls 08/19/2015   Essential hypertension 08/19/2015   Hyperlipidemia LDL goal <70 08/19/2015   Asthma 08/19/2015   Paroxysmal atrial fibrillation (HCC) 08/19/2015   Hyperthyroidism 08/19/2015   Chronic HFrEF (heart failure with reduced ejection fraction) (HCC)    Coronary artery disease    Aortic stenosis     Orientation RESPIRATION BLADDER Height & Weight     Self, Place  O2 (2L [place]) Incontinent, External catheter Weight: 219 lb 9.3 oz (99.6 kg) Height:     BEHAVIORAL SYMPTOMS/MOOD NEUROLOGICAL BOWEL NUTRITION STATUS      Continent Diet (see dc summary)   AMBULATORY STATUS COMMUNICATION OF NEEDS Skin   Extensive Assist Verbally Normal                       Personal Care Assistance Level of Assistance  Bathing, Feeding, Dressing Bathing Assistance: Limited assistance Feeding assistance: Independent Dressing Assistance: Limited assistance     Functional Limitations Info  Sight, Hearing, Speech Sight Info: Adequate Hearing Info: Adequate Speech Info: Adequate    SPECIAL CARE FACTORS FREQUENCY  PT (By licensed PT), OT (By licensed OT)     PT Frequency: 5x week OT Frequency: 5c week            Contractures      Additional Factors Info  Code Status, Allergies, Psychotropic, Insulin  Sliding Scale Code Status Info: Full Allergies Info: Tetanus Toxoid Adsorbed  Zestril  (Lisinopril )  Peanut (Diagnostic) Psychotropic Info: sertraline  (ZOLOFT ) tablet 50 mg daily         Current Medications (03/17/2024):  This is the current hospital active medication list Current Facility-Administered Medications  Medication Dose Route Frequency Provider Last Rate Last Admin   acetaminophen  (TYLENOL ) tablet 650 mg  650 mg Oral Q6H PRN Sundil, Subrina, MD   650 mg at 03/16/24 2130   Or   acetaminophen  (TYLENOL ) suppository 650 mg  650 mg Rectal Q6H PRN Sundil, Subrina, MD       apixaban  (ELIQUIS ) tablet 5 mg  5 mg Oral BID Ogbata, Sylvester I, MD   5 mg at 03/17/24 0850   arformoterol  (BROVANA ) nebulizer solution 15 mcg  15 mcg Nebulization BID Gretta Leita SQUIBB, DO   15 mcg at 03/17/24 9270   azithromycin  (ZITHROMAX ) 200 MG/5ML suspension 500 mg  500 mg Oral Daily Sundil, Subrina, MD   500 mg at 03/16/24 2256   carvedilol  (COREG ) tablet 25 mg  25 mg Oral BID WC Sundil, Subrina, MD   25 mg at 03/17/24 9149   diclofenac  Sodium (VOLTAREN ) 1 % topical gel 2 g  2 g Topical QID PRN Opyd, Timothy S, MD       furosemide  (LASIX ) injection 20 mg  20 mg Intravenous Daily Sundil, Subrina, MD   20 mg at 03/17/24 1027   ipratropium-albuterol  (DUONEB)  0.5-2.5 (3) MG/3ML nebulizer solution 3 mL  3 mL Nebulization Q6H PRN Sundil, Subrina, MD       melatonin tablet 5 mg  5 mg Oral QHS Sundil, Subrina, MD   5 mg at 03/16/24 2130   methimazole  (TAPAZOLE ) tablet 5 mg  5 mg Oral TID Sundil, Subrina, MD   5 mg at 03/17/24 1033   methylPREDNISolone  sodium succinate (SOLU-MEDROL ) 40 mg/mL injection 40 mg  40 mg Intravenous Daily Sundil, Subrina, MD   40 mg at 03/17/24 1030   ondansetron  (ZOFRAN ) tablet 4 mg  4 mg Oral Q6H PRN Sundil, Subrina, MD       Or   ondansetron  (ZOFRAN ) injection 4 mg  4 mg Intravenous Q6H PRN Sundil, Subrina, MD       pantoprazole  (PROTONIX ) EC tablet 40 mg  40 mg Oral Daily Sundil, Subrina, MD   40 mg at 03/17/24 0850   revefenacin  (YUPELRI ) nebulizer solution 175 mcg  175 mcg Nebulization Daily Gretta Leita SQUIBB, DO   175 mcg at 03/17/24 0729   rosuvastatin  (CRESTOR ) tablet 20  mg  20 mg Oral Daily Sundil, Subrina, MD   20 mg at 03/17/24 0850   sertraline  (ZOLOFT ) tablet 50 mg  50 mg Oral Daily Sundil, Subrina, MD   50 mg at 03/17/24 0850   sodium chloride  flush (NS) 0.9 % injection 3 mL  3 mL Intravenous Q12H Sundil, Subrina, MD   3 mL at 03/17/24 0850   sodium chloride  flush (NS) 0.9 % injection 3 mL  3 mL Intravenous PRN Sundil, Subrina, MD         Discharge Medications: Please see discharge summary for a list of discharge medications.  Relevant Imaging Results:  Relevant Lab Results:   Additional Information    Sania Noy B Joseh Sjogren, LCSWA     "

## 2024-03-17 NOTE — Plan of Care (Signed)
   Problem: Education: Goal: Knowledge of General Education information will improve Description: Including pain rating scale, medication(s)/side effects and non-pharmacologic comfort measures Outcome: Progressing   Problem: Health Behavior/Discharge Planning: Goal: Ability to manage health-related needs will improve Outcome: Progressing   Problem: Clinical Measurements: Goal: Respiratory complications will improve Outcome: Progressing Goal: Cardiovascular complication will be avoided Outcome: Progressing

## 2024-03-17 NOTE — Progress Notes (Signed)
 " PROGRESS NOTE    Danielle [name]  FMW:[id number] DOB: [birth year] DOA: 03/15/2024 PCP: [name], [name], MD  Outpatient Specialists:     Brief Narrative:  Patient is a 79 year old female past medical history significant for documented COPD, dementia,aortic insufficiency status post bioprosthetic aortic valve placement in 2016, HFrEF 40 to 45%, paroxysmal atrial fibrillation on Eliquis , prior CVA x 2, CKD 3B, hyperthyroidism, ITP-chronically low platelet count, and history of breast cancer treated with lumpectomy radiation.  Due to patient's dementing illness, patient is unable to give significant history.  Patient is documented to have been admitted with shortness of breath.  As per H&P, EMS reported audible wheezing  03/16/2024, outpatient seen.  Patient is unable to give any significant history.  Patient has dementing illness.  Patient has audible wheezing.  Suspect likely vocal cord dysfunction.  Will consult the pulmonary team to assist with patient's management.  03/17/2024: Patient seen.  Pulmonary team input is appreciated.  Patient is improving.  Wheezing is improving.   Assessment & Plan:   Principal Problem:   COPD with acute exacerbation (HCC) Active Problems:   Paroxysmal atrial fibrillation (HCC)   Hyperthyroidism   S/P aortic valve replacement with bioprosthetic valve   Acute kidney injury superimposed on stage 3b chronic kidney disease (HCC)   Acute on chronic combined systolic and diastolic CHF (congestive heart failure) (HCC)   History of aortic insufficiency   History of ITP   History of breast cancer   History of dementia   GAD (generalized anxiety disorder)   Wheezing   Dementia (HCC)   Vocal cord dysfunction: -Patient has audible wheeze. - Most of the wheezing has come from the upper airway. - Will consult the pulmonary team. 03/17/2024: Wheezing has improved.  COPD with acute exacerbation documented: - Significant air entry. - No significant  expiratory wheeze. - Will await pulmonary team input is appreciated. - Continue current management for now.  Chronic kidney disease stage IIIb versus acute on chronic kidney disease stage IIIa: - Baseline serum creatinine of 1.24-1.3. - Serum creatinine of 1.59 on presentation. - Serum creatinine of 1.31 today - Suspect patient has baseline chronic kidney disease stage IIIb.  Paroxysmal A-fib -Continue Coreg  25 mg twice daily and Eliquis  5 mg twice daily.     Hyperthyroidism - Continue methimazole  5 mg p.o. 3 times daily   History of chronic anemia - Will globin of 10.2 g/dL and MCV of 67.5. -MCV of 96.4.     History of ITP - Last platelet count of 52.  - Continue to monitor closely.   -CBC in the morning. - Patient is on Solu-Medrol  IV.   Generalized anxiety disorder: -Stable.  History of dementia: - No behavioral problems.   Recent fall History of left femoral neck fracture status post ORIF As per prior documentation: - Patient experienced recent fall 1/13.   -Imaging from 1/13 bilateral knees showing severe osteoarthritis.  No dislocation or fracture.  Chest x-ray no acute cardiopulmonary process.  CT head no acute intracranial abnormality.  CT cervical spine no evidence of fracture.  CT pelvis no acute osseous process.  Status post ORIF of the left femoral neck fracture.  CT lumbar spine no evidence of fracture. -Since the fall patient reported that she has been hurting all over specifically bilateral knees.  Underlying history of dementia and high risk for fall.  Consulting inpatient PT and OT for further evaluation of balance.  Giving tramadol  50 mg 3 times daily prn for moderate/severe pain control .  03/16/2024: Will avoid tramadol .     Hyperlipidemia - Continue Crestor   DVT prophylaxis: Apixaban . Code Status: Full code. Family Communication: None present. Disposition Plan: This will depend on hospital course.   Consultants:  Pulmonary team  consulted.  Procedures:  None.  Antimicrobials:  Azithromycin  500 mg p.o. once daily.   Subjective: Patient is unable to give coherent history. Patient has dementia.    Objective: Vitals:   03/17/24 0300 03/17/24 0733 03/17/24 0753 03/17/24 1154  BP: 127/80  123/86 111/73  Pulse: 60 63 63 70  Resp: 18 16 18 20   Temp: 97.9 F (36.6 C)  97.6 F (36.4 C) 98 F (36.7 C)  TempSrc: Oral  Oral Oral  SpO2: 99% 100% 98% 92%  Weight: 99.6 kg       Intake/Output Summary (Last 24 hours) at 03/17/2024 1435 Last data filed at 03/17/2024 0850 Gross per 24 hour  Intake 3 ml  Output 500 ml  Net -497 ml   Filed Weights   03/17/24 0300  Weight: 99.6 kg    Examination:  General exam: Appears calm and comfortable  Respiratory system: Audible wheezing has improved.   Cardiovascular system: S1 & S2 heard Gastrointestinal system: Abdomen is soft and nontender.  Central nervous system: Patient has dementia.  Extremities: Fullness of the ankle.    Data Reviewed: I have personally reviewed following labs and imaging studies  CBC: Recent Labs  Lab 03/13/24 1412 03/15/24 1721 03/16/24 0030  WBC 9.4 8.3 8.0  NEUTROABS 7.6 6.7  --   HGB 10.8* 9.9* 10.3*  HCT 33.2* 30.3* 32.4*  MCV 94.3 95.6 96.4  PLT 37* 40* 52*   Basic Metabolic Panel: Recent Labs  Lab 03/13/24 1543 03/15/24 1721 03/16/24 0030 03/17/24 0651  NA 138 137 135 132*  K 4.1 4.3 4.4 4.6  CL 106 107 104 101  CO2 21* 21* 20* 23  GLUCOSE 103* 122* 177* 128*  BUN 13 24* 24* 35*  CREATININE 1.30* 1.59* 1.40* 1.31*  CALCIUM  9.4 9.3 9.6 9.3  MG  --   --   --  2.2   GFR: Estimated Creatinine Clearance: 42.9 mL/min (A) (by C-G formula based on SCr of 1.31 mg/dL (H)). Liver Function Tests: Recent Labs  Lab 03/15/24 1721  AST 66*  ALT 47*  ALKPHOS 98  BILITOT 0.7  PROT 6.0*  ALBUMIN  2.9*   No results for input(s): LIPASE, AMYLASE in the last 168 hours. No results for input(s): AMMONIA in the last  168 hours. Coagulation Profile: No results for input(s): INR, PROTIME in the last 168 hours. Cardiac Enzymes: No results for input(s): CKTOTAL, CKMB, CKMBINDEX, TROPONINI in the last 168 hours. BNP (last 3 results) Recent Labs    02/14/24 2039 03/15/24 1721  PROBNP 834.0* 2,743.0*   HbA1C: No results for input(s): HGBA1C in the last 72 hours. CBG: No results for input(s): GLUCAP in the last 168 hours. Lipid Profile: No results for input(s): CHOL, HDL, LDLCALC, TRIG, CHOLHDL, LDLDIRECT in the last 72 hours. Thyroid  Function Tests: No results for input(s): TSH, T4TOTAL, FREET4, T3FREE, THYROIDAB in the last 72 hours. Anemia Panel: No results for input(s): VITAMINB12, FOLATE, FERRITIN, TIBC, IRON, RETICCTPCT in the last 72 hours. Urine analysis:    Component Value Date/Time   COLORURINE YELLOW 03/15/2024 2210   APPEARANCEUR CLEAR 03/15/2024 2210   LABSPEC 1.010 03/15/2024 2210   PHURINE 6.0 03/15/2024 2210   GLUCOSEU NEGATIVE 03/15/2024 2210   GLUCOSEU 500 (A) 12/08/2020 0926   HGBUR NEGATIVE 03/15/2024 2210  BILIRUBINUR NEGATIVE 03/15/2024 2210   KETONESUR NEGATIVE 03/15/2024 2210   PROTEINUR NEGATIVE 03/15/2024 2210   UROBILINOGEN 0.2 04/18/2021 1411   NITRITE NEGATIVE 03/15/2024 2210   LEUKOCYTESUR NEGATIVE 03/15/2024 2210   Sepsis Labs: @LABRCNTIP (procalcitonin:4,lacticidven:4)  ) Recent Results (from the past 240 hours)  Resp panel by RT-PCR (RSV, Flu A&B, Covid) Anterior Nasal Swab     Status: None   Collection Time: 03/15/24  4:18 PM   Specimen: Anterior Nasal Swab  Result Value Ref Range Status   SARS Coronavirus 2 by RT PCR NEGATIVE NEGATIVE Final   Influenza A by PCR NEGATIVE NEGATIVE Final   Influenza B by PCR NEGATIVE NEGATIVE Final    Comment: (NOTE) The Xpert Xpress SARS-CoV-2/FLU/RSV plus assay is intended as an aid in the diagnosis of influenza from Nasopharyngeal swab specimens and should not be  used as a sole basis for treatment. Nasal washings and aspirates are unacceptable for Xpert Xpress SARS-CoV-2/FLU/RSV testing.  Fact Sheet for Patients: bloggercourse.com  Fact Sheet for Healthcare Providers: seriousbroker.it  This test is not yet approved or cleared by the [place]  FDA and has been authorized for detection and/or diagnosis of SARS-CoV-2 by FDA under an Emergency Use Authorization (EUA). This EUA will remain in effect (meaning this test can be used) for the duration of the COVID-19 declaration under Section 564(b)(1) of the Act, 21 U.S.C. section 360bbb-3(b)(1), unless the authorization is terminated or revoked.     Resp Syncytial Virus by PCR NEGATIVE NEGATIVE Final    Comment: (NOTE) Fact Sheet for Patients: bloggercourse.com  Fact Sheet for Healthcare Providers: seriousbroker.it  This test is not yet approved or cleared by the [place]  FDA and has been authorized for detection and/or diagnosis of SARS-CoV-2 by FDA under an Emergency Use Authorization (EUA). This EUA will remain in effect (meaning this test can be used) for the duration of the COVID-19 declaration under Section 564(b)(1) of the Act, 21 U.S.C. section 360bbb-3(b)(1), unless the authorization is terminated or revoked.  Performed at [facility], [address] [place], [place] [address]   Respiratory (~20 pathogens) panel by PCR     Status: None   Collection Time: 03/16/24  4:12 PM   Specimen: Nasopharyngeal Swab; Respiratory  Result Value Ref Range Status   Adenovirus NOT DETECTED NOT DETECTED Final   Coronavirus 229E NOT DETECTED NOT DETECTED Final    Comment: (NOTE) The Coronavirus on the Respiratory Panel, DOES NOT test for the novel  Coronavirus (2019 nCoV)    Coronavirus HKU1 NOT DETECTED NOT DETECTED Final   Coronavirus NL63 NOT DETECTED NOT DETECTED Final    Coronavirus OC43 NOT DETECTED NOT DETECTED Final   Metapneumovirus NOT DETECTED NOT DETECTED Final   Rhinovirus / Enterovirus NOT DETECTED NOT DETECTED Final   Influenza A NOT DETECTED NOT DETECTED Final   Influenza B NOT DETECTED NOT DETECTED Final   Parainfluenza Virus 1 NOT DETECTED NOT DETECTED Final   Parainfluenza Virus 2 NOT DETECTED NOT DETECTED Final   Parainfluenza Virus 3 NOT DETECTED NOT DETECTED Final   Parainfluenza Virus 4 NOT DETECTED NOT DETECTED Final   Respiratory Syncytial Virus NOT DETECTED NOT DETECTED Final   Bordetella pertussis NOT DETECTED NOT DETECTED Final   Bordetella Parapertussis NOT DETECTED NOT DETECTED Final   Chlamydophila pneumoniae NOT DETECTED NOT DETECTED Final   Mycoplasma pneumoniae NOT DETECTED NOT DETECTED Final    Comment: Performed at [facility], [address] [place], [place] [address]  Radiology Studies: ECHOCARDIOGRAM COMPLETE Result Date: 03/16/2024    ECHOCARDIOGRAM REPORT   Patient Name:   MALIHA OUTTEN Date of Exam: 03/16/2024 Medical Rec #:  [id number]                   Height:       67.0 in Accession #:    [id number]                  Weight:       224.9 lb Date of Birth:  1945/11/26                  BSA:          2.126 m Patient Age:    29 years                    BP:           108/75 mmHg Patient Gender: F                           HR:           80 bpm. Exam Location:  Inpatient Procedure: 2D Echo, Cardiac Doppler, Color Doppler and Intracardiac            Opacification Agent (Both Spectral and Color Flow Doppler were            utilized during procedure). Indications:    CHF-acute systolic 150.21  History:        Patient has prior history of Echocardiogram examinations, most                 recent 12/28/2022. COPD; Risk Factors:Hypertension, Dyslipidemia                 and Former Smoker.                 Aortic Valve: 23 mm St. Jude bioprosthetic valve is present in                 the aortic position.  Procedure Date: 03/12/2014.  Sonographer:    Merlynn Argyle Referring Phys: [id number] SUBRINA SUNDIL  Sonographer Comments: Technically difficult study due to poor echo windows and no subcostal window. Image acquisition challenging due to patient body habitus, Image acquisition challenging due to COPD and Image acquisition challenging due to respiratory motion. IMPRESSIONS  1. Extremely poor acoustic windows Definity  sues. LVEF appears mildly depres with hypokinesis of distal and apical segments. With Definity  use, there is a filling defect= that is consistent with thrombus (Images 94 96, 106), Poor acoustic windows limit evaluation. . Left ventricular ejection fraction, by estimation, is 40 to 45%. The left ventricle has mildly decreased function.  2. Right ventricular systolic function is normal. The right ventricular size is normal.  3. The mitral valve is normal in structure. Trivial mitral valve regurgitation.  4. S/p AVR (23 mm St Jude bioprostheis; procedure dated 03/12/14). Peak and mean gradients through the valve are 23 and 13 mm HG respectively.. The aortic valve has been repaired/replaced. Aortic valve regurgitation is not visualized. There is a 23 mm St. Jude bioprosthetic valve present in the aortic position. Procedure Date: 03/12/2014. FINDINGS  Left Ventricle: Extremely poor acoustic windows Definity  sues. LVEF appears mildly depres with hypokinesis of distal and apical segments. With Definity  use, there is a filling defect= that is consistent with thrombus (Images 94 96, 106), Poor acoustic windows limit evaluation. Left  ventricular ejection fraction, by estimation, is 40 to 45%. The left ventricle has mildly decreased function. Definity  contrast agent was given IV to delineate the left ventricular endocardial borders. The left ventricular internal cavity size was normal in size. There is no left ventricular hypertrophy. Right Ventricle: The right ventricular size is normal. Right vetricular wall thickness  was not assessed. Right ventricular systolic function is normal. Left Atrium: Left atrial size was normal in size. Right Atrium: Right atrial size was normal in size. Pericardium: Trivial pericardial effusion is present. Mitral Valve: The mitral valve is normal in structure. Trivial mitral valve regurgitation. Tricuspid Valve: The tricuspid valve is not well visualized. Tricuspid valve regurgitation is trivial. Aortic Valve: S/p AVR (23 mm St Jude bioprostheis; procedure dated 03/12/14). Peak and mean gradients through the valve are 23 and 13 mm HG respectively. The aortic valve has been repaired/replaced. Aortic valve regurgitation is not visualized. Aortic valve mean gradient measures 13.0 mmHg. Aortic valve peak gradient measures 23.2 mmHg. Aortic valve area, by VTI measures 1.52 cm. There is a 23 mm St. Jude bioprosthetic valve present in the aortic position. Procedure Date: 03/12/2014. Pulmonic Valve: The pulmonic valve was not well visualized. Pulmonic valve regurgitation is trivial. Aorta: The aortic root is normal in size and structure. IAS/Shunts: The interatrial septum was not assessed.  LEFT VENTRICLE PLAX 2D LVIDd:         4.10 cm   Diastology LVIDs:         3.20 cm   LV e' medial:    3.37 cm/s LV PW:         1.30 cm   LV E/e' medial:  14.8 LV IVS:        1.10 cm   LV e' lateral:   7.07 cm/s LVOT diam:     2.20 cm   LV E/e' lateral: 7.0 LV SV:         64 LV SV Index:   30 LVOT Area:     3.80 cm  AORTIC VALVE AV Area (Vmax):    1.13 cm AV Area (Vmean):   1.12 cm AV Area (VTI):     1.52 cm AV Vmax:           241.00 cm/s AV Vmean:          170.000 cm/s AV VTI:            0.423 m AV Peak Grad:      23.2 mmHg AV Mean Grad:      13.0 mmHg LVOT Vmax:         71.85 cm/s LVOT Vmean:        50.100 cm/s LVOT VTI:          0.170 m LVOT/AV VTI ratio: 0.40  AORTA Ao Root diam: 3.50 cm Ao Asc diam:  3.30 cm MITRAL VALVE MV Area (PHT): 3.16 cm     SHUNTS MV Decel Time: 240 msec     Systemic VTI:  0.17 m MV E  velocity: 49.80 cm/s   Systemic Diam: 2.20 cm MV A velocity: 106.00 cm/s MV E/A ratio:  0.47 Vina Gull MD Electronically signed by Vina Gull MD Signature Date/Time: 03/16/2024/3:38:59 PM    Final    DG Chest Port 1 View Result Date: 03/15/2024 CLINICAL DATA:  Shortness of breath, audible wheezing EXAM: PORTABLE CHEST 1 VIEW COMPARISON:  03/13/2024 FINDINGS: Single frontal view of the chest demonstrates an unremarkable cardiac silhouette. Stable chronic interstitial prominence most consistent with background emphysema and  scarring. No acute airspace disease, effusion, or pneumothorax. No acute bony abnormalities. IMPRESSION: 1. Stable chest, no acute process. Electronically Signed   By: Ozell Daring M.D.   On: 03/15/2024 16:43        Scheduled Meds:  apixaban   5 mg Oral BID   arformoterol   15 mcg Nebulization BID   azithromycin   500 mg Oral Daily   carvedilol   25 mg Oral BID WC   furosemide   20 mg Intravenous Daily   melatonin  5 mg Oral QHS   methimazole   5 mg Oral TID   methylPREDNISolone  (SOLU-MEDROL ) injection  40 mg Intravenous Daily   pantoprazole   40 mg Oral Daily   revefenacin   175 mcg Nebulization Daily   rosuvastatin   20 mg Oral Daily   sertraline   50 mg Oral Daily   sodium chloride  flush  3 mL Intravenous Q12H   Continuous Infusions:     LOS: 2 days    Time spent: 35 minutes.    Leatrice Chapel, MD  [facility] 7PM-7AM contact night coverage as above    "

## 2024-03-17 NOTE — TOC Initial Note (Signed)
 Transition of Care [facility]) - Initial/Assessment Note    Patient Details  Name: Danielle [name] MRN: [id number] Date of Birth: Nov 20, 1945  Transition [facility] CM/SW Contact:    Hartley KATHEE Robertson, LCSWA Phone Number: 03/17/2024, 12:52 PM  Clinical Narrative:                  CSW spoke with pt's brother about SNF recommendation, he is agreeable, CSW explained insurance auth and [place].gov resource, pt worked up and faxed out. ICM will continue to follow.  Expected Discharge Plan: Skilled Nursing Facility Barriers to Discharge: Continued Medical Work up, English As A Second Language Teacher   Patient Goals and CMS Choice Patient states their goals for this hospitalization and ongoing recovery are:: get stronger CMS Medicare.gov Compare Post Acute Care list provided to:: Patient Represenative (must comment) Choice offered to / list presented to : Sibling [place] ownership interest in [facility].provided to:: Sibling    Expected Discharge Plan and Services In-house Referral: Clinical Social Work     Living arrangements for the past 2 months: Single Family Home                                      Prior Living Arrangements/Services Living arrangements for the past 2 months: Single Family Home Lives with:: Siblings Patient language and need for interpreter reviewed:: Yes        Need for Family Participation in Patient Care: Yes (Comment) Care giver support system in place?: Yes (comment)   Criminal Activity/Legal Involvement Pertinent to Current Situation/Hospitalization: No - Comment as needed  Activities of Daily Living      Permission Sought/Granted                  Emotional Assessment Appearance:: Appears stated age     Orientation: : Oriented to Self, Oriented to Place Alcohol / Substance Use: Not Applicable Psych Involvement: No (comment)  Admission diagnosis:  Respiratory distress [R06.03] COPD exacerbation (HCC) [J44.1] COPD  with acute exacerbation (HCC) [J44.1] Patient Active Problem List   Diagnosis Date Noted   Wheezing 03/16/2024   Dementia (HCC) 03/16/2024   Acute on chronic combined systolic and diastolic CHF (congestive heart failure) (HCC) 03/15/2024   History of aortic insufficiency 03/15/2024   History of ITP 03/15/2024   History of breast cancer 03/15/2024   History of dementia 03/15/2024   GAD (generalized anxiety disorder) 03/15/2024   Neurodegenerative cognitive impairment 11/03/2023   Malignant neoplasm of cervix, unspecified site (HCC) 08/03/2023   Incontinence of urine in female 08/03/2023   Acute pulmonary embolism (HCC) 12/27/2022   SIRS (systemic inflammatory response syndrome) (HCC) 12/25/2022   Elevated troponin 12/25/2022   Acute kidney injury superimposed on stage 3b chronic kidney disease (HCC) 12/25/2022   Nausea, vomiting, and diarrhea 12/25/2022   Obesity (BMI 30-39.9) 12/25/2022   Gastroesophageal reflux disease 04/09/2022   Abdominal pain, periumbilical 04/09/2022   Chronic pain of both shoulders 12/17/2021   Depression, major, recurrent, moderate (HCC) 12/08/2020   Gait abnormality 07/31/2020   Paresthesia 07/31/2020   Low back pain with bilateral sciatica 07/31/2020   Pneumonia due to COVID-19 virus    Bacteremia due to Gram-positive bacteria 08/29/2018   Abscess    Cellulitis of left breast 08/28/2018   Cellulitis of left abdominal wall 08/28/2018   Routine general medical examination at a health care facility 04/28/2018   Stage 3b chronic kidney disease (HCC) 09/27/2017  Recurrent genital herpes 04/13/2017   Chronic pain disorder 01/25/2017   Malignant neoplasm of upper-outer quadrant of left breast in female, estrogen receptor positive (HCC) 12/16/2016   Chronic knee pain 12/07/2016   Chronic bilateral low back pain without sciatica 12/07/2016   Peripheral musculoskeletal gait disorder 12/07/2016   Encounter for therapeutic drug monitoring 06/16/2016    Generalized osteoarthritis of multiple sites 05/04/2016   Aortic valve disease 04/10/2016   S/P aortic valve replacement with bioprosthetic valve 04/10/2016   NICM (nonischemic cardiomyopathy) (HCC) 04/10/2016   Upper airway cough syndrome 03/14/2016   Class 2 severe obesity with serious comorbidity in adult 03/14/2016   COPD with acute exacerbation (HCC) 03/11/2016   Thrombocytopenia 12/12/2015   Anemia 12/12/2015   Vitamin D  deficiency 12/12/2015   Acute urinary retention 12/11/2015   Osteoporosis 12/11/2015   Hip fracture (HCC) 12/10/2015   Aortic atherosclerosis 12/10/2015   Lung blebs (HCC) 12/03/2015   Pelvic fracture (HCC) 08/19/2015   At risk for falls 08/19/2015   Essential hypertension 08/19/2015   Hyperlipidemia LDL goal <70 08/19/2015   Asthma 08/19/2015   Paroxysmal atrial fibrillation (HCC) 08/19/2015   Hyperthyroidism 08/19/2015   Chronic HFrEF (heart failure with reduced ejection fraction) (HCC)    Coronary artery disease    Aortic stenosis    PCP:  [name], [name], MD Pharmacy:   [facility]) [place], [place] - [address] [place] [address] [place] [address] Phone: [phone] Fax: [phone]  [facility] - [address] ([place]), [place] - [address] [place]) [place] [address] Phone: [phone] Fax: [phone]  [facility] - [place], [place] - [address] [place] [address] Phone: [phone] Fax: [phone]  [facility] [place], [place] - [address] [place] [address] Phone: [phone] Fax: [phone]  [facility] - [place], [place] - [address] [place] [address] Phone: [phone] Fax: [phone]  [facility] [address] [place] [address] Phone: [phone] Fax:  [phone]     Social Drivers of Health (SDOH) Social History: SDOH Screenings   Food Insecurity: No Food Insecurity (03/16/2024)  Housing: Unknown (03/16/2024)  Transportation Needs: No Transportation Needs (03/16/2024)  Utilities: Not At Risk (03/16/2024)  Alcohol Screen: Low Risk (02/04/2023)  Depression (PHQ2-9): Low Risk (02/04/2023)  Financial Resource Strain: Low Risk (02/04/2023)  Physical Activity: Inactive (03/15/2024)  Social Connections: Unknown (03/16/2024)  Stress: No Stress Concern Present (02/04/2023)  Tobacco Use: Medium Risk (03/15/2024)  Health Literacy: Adequate Health Literacy (02/04/2023)   SDOH Interventions: Physical Activity Interventions: Patient Declined   Readmission Risk Interventions     No data to display

## 2024-03-17 NOTE — Progress Notes (Signed)
 "  NAME:  Danielle [name], MRN:  [id number], DOB:  12-15-1945, LOS: 2 ADMISSION DATE:  03/15/2024, CONSULTATION DATE:  1/16 REFERRING MD:  Azucena, CHIEF COMPLAINT:  wheezing, concern for VC issue  History of Present Illness:  [name] is a 79 y/o woman with a history of dementia, asthma since childhood (cannot find this in her chart), AS s/p AVR, NICM, breast cancer who She reports that she presented for SOB, dry cough for a long time. She does not think it got worse in the days preceeding admission. She uses albuterol  at home, but no scheduled inhalers. She is a nonsmoker. Per EMS, the 2 duonebs she received in transport improved her symptoms. In the ED she received multiple additional nebs. She was started on azithromycin  at admission.  She was started on steroids today. Today PCCM was consulted for wheezing and vocal cord concerns.   On 1/13 presented to the ED with a knee injury. When she was evaluated she was noticed to be wheezing, which resolved with a breathing treatment. She was discharged home that day. Lives at home with brother and has a caretaker.   Pertinent  Medical History  AS s/p bioprosthetic AVR pAF NICM Breast cancer Hyperthyroidism HTN HLD GERD CKD  Significant Hospital Events: Including procedures, antibiotic start and stop dates in addition to other pertinent events   1/15 nebs, azithromycin  1/16  steroids added 1/17 No issues overnight, no subjective or objective wheezing assess this am   Interim History / Subjective:  Seen sitting up in bed with no acute complaints, denies any further wheezing   Objective    Blood pressure 127/80, pulse 60, temperature 97.9 F (36.6 C), temperature source Oral, resp. rate 18, weight 99.6 kg, SpO2 99%.        Intake/Output Summary (Last 24 hours) at 03/17/2024 9271 Last data filed at 03/17/2024 0300 Gross per 24 hour  Intake --  Output 500 ml  Net -500 ml   Filed Weights   03/17/24 0300   Weight: 99.6 kg    Examination: General: Acute on chronically ill appearing elderly female lying in bed, in NAD HEENT: [place]/AT, MM pink/moist, PERRL,  Neuro: Alert and oriented x3, non-focal, flat affect  CV: s1s2 regular rate and rhythm, no murmur, rubs, or gallops,  PULM:  Clear to auscultation, no increased work of breathing, oxygen  saturations 99% on 2L [place], no wheezing  GI: soft, bowel sounds active in all 4 quadrants, non-tender, non-distended, tolerating oral diet Extremities: warm/dry, no edema  Skin: no rashes or lesions  Resolved problem list   Assessment and Plan   Wheezing -Suspect acute obstructive lung disease flare- either asthma or COPD. Had normal spirometry in 2018. History not clear. Not requiring oxygen , no respiratory distress. Potential for wheezing related to viral infection. Without stridor, low suspicion that this is a fixed upper airway lesion. -RVP panel negative  P: Continue schedule bronchodilators  Short steroid course  Wean oxygen  with goal of RA Continue Azithromycin  course  Last seen on the clinic 2018 would benefit from outpatient pulm follow up   HFrEF -ECHO 1/16 with poor windows, EF estimated at 40-45%, hypokineses of distal and apical segments with signs of thrombus  Aortic insufficiency s/p bioprosthetic aortic valve 2016 Anticoagulated with Eliquis  Paroxysmal A-fib Essential hypertension P: Recommend cardiology/HF consult  Continue Elquis  Consider alterative beta blocker that is more B2 selective, per primary   Critical care time: N/A  Jayant Kriz D. Arloa, NP-C [facility] Personal contact  information can be found on Amion  If no contact or response made please call [phone] 03/17/2024, 7:29 AM          "

## 2024-03-17 NOTE — Progress Notes (Signed)
 IV team consult placed due to need for administration of IV medications.     03/17/24 0855  Unsuccessful Nursing Procedure/Treatment  Type of Nursing Procedure/Treatment Peripheral IV insertion  Number of attempts 0  Location of attempt Assessed left arm, right arm restricted.

## 2024-03-18 LAB — RENAL FUNCTION PANEL
Albumin: 2.8 g/dL — ABNORMAL LOW (ref 3.5–5.0)
Anion gap: 9 (ref 5–15)
BUN: 42 mg/dL — ABNORMAL HIGH (ref 8–23)
CO2: 23 mmol/L (ref 22–32)
Calcium: 9.2 mg/dL (ref 8.9–10.3)
Chloride: 99 mmol/L (ref 98–111)
Creatinine, Ser: 1.33 mg/dL — ABNORMAL HIGH (ref 0.44–1.00)
GFR, Estimated: 41 mL/min — ABNORMAL LOW
Glucose, Bld: 119 mg/dL — ABNORMAL HIGH (ref 70–99)
Phosphorus: 2.7 mg/dL (ref 2.5–4.6)
Potassium: 4.7 mmol/L (ref 3.5–5.1)
Sodium: 131 mmol/L — ABNORMAL LOW (ref 135–145)

## 2024-03-18 LAB — CBC WITH DIFFERENTIAL/PLATELET
Abs Immature Granulocytes: 0.1 K/uL — ABNORMAL HIGH (ref 0.00–0.07)
Basophils Absolute: 0 K/uL (ref 0.0–0.1)
Basophils Relative: 0 %
Eosinophils Absolute: 0 K/uL (ref 0.0–0.5)
Eosinophils Relative: 0 %
HCT: 29.7 % — ABNORMAL LOW (ref 36.0–46.0)
Hemoglobin: 10.2 g/dL — ABNORMAL LOW (ref 12.0–15.0)
Immature Granulocytes: 1 %
Lymphocytes Relative: 4 %
Lymphs Abs: 0.5 K/uL — ABNORMAL LOW (ref 0.7–4.0)
MCH: 31.3 pg (ref 26.0–34.0)
MCHC: 34.3 g/dL (ref 30.0–36.0)
MCV: 91.1 fL (ref 80.0–100.0)
Monocytes Absolute: 0.7 K/uL (ref 0.1–1.0)
Monocytes Relative: 7 %
Neutro Abs: 9.5 K/uL — ABNORMAL HIGH (ref 1.7–7.7)
Neutrophils Relative %: 88 %
Platelets: UNDETERMINED K/uL (ref 150–400)
RBC: 3.26 MIL/uL — ABNORMAL LOW (ref 3.87–5.11)
RDW: 13.4 % (ref 11.5–15.5)
WBC: 10.8 K/uL — ABNORMAL HIGH (ref 4.0–10.5)
nRBC: 0.2 % (ref 0.0–0.2)

## 2024-03-18 LAB — MAGNESIUM: Magnesium: 2.1 mg/dL (ref 1.7–2.4)

## 2024-03-18 MED ORDER — TRAMADOL HCL 50 MG PO TABS
25.0000 mg | ORAL_TABLET | Freq: Once | ORAL | Status: AC
  Administered 2024-03-18: 25 mg via ORAL
  Filled 2024-03-18: qty 1

## 2024-03-18 NOTE — Progress Notes (Signed)
 OT Cancellation Note  Patient Details Name: Danielle [name] MRN: [id number] DOB: 08-Dec-1945   Cancelled Treatment:    Reason Eval/Treat Not Completed: Other (comment) (imminent order noted, pt has not had a change in POC. OT to continue to follow as scheduled.)  [name] 03/18/2024, 7:15 AM

## 2024-03-18 NOTE — Progress Notes (Signed)
 " PROGRESS NOTE    Danielle [name]  FMW:[id number] DOB: 1945/09/08 DOA: 03/15/2024 PCP: [name], [name], MD  Outpatient Specialists:     Brief Narrative:  Patient is a 79 year old female past medical history significant for documented COPD, dementia,aortic insufficiency status post bioprosthetic aortic valve placement in 2016, HFrEF 40 to 45%, paroxysmal atrial fibrillation on Eliquis , prior CVA x 2, CKD 3B, hyperthyroidism, ITP-chronically low platelet count, and history of breast cancer treated with lumpectomy radiation.  Due to patient's dementing illness, patient is unable to give significant history.  Patient is documented to have been admitted with shortness of breath.  As per H&P, EMS reported audible wheezing  03/16/2024, outpatient seen.  Patient is unable to give any significant history.  Patient has dementing illness.  Patient has audible wheezing.  Suspect likely vocal cord dysfunction.  Will consult the pulmonary team to assist with patient's management.  03/18/2024: Patient seen.  Patient continues to improve.  Consult [employer] [facility].  SNF is recommended.  Pursue disposition.  Assessment & Plan:   Principal Problem:   COPD with acute exacerbation (HCC) Active Problems:   Paroxysmal atrial fibrillation (HCC)   Hyperthyroidism   S/P aortic valve replacement with bioprosthetic valve   Acute kidney injury superimposed on stage 3b chronic kidney disease (HCC)   Acute on chronic combined systolic and diastolic CHF (congestive heart failure) (HCC)   History of aortic insufficiency   History of ITP   History of breast cancer   History of dementia   GAD (generalized anxiety disorder)   Wheezing   Dementia (HCC)   Possible vocal cord dysfunction (inducible laryngeal obstruction): - Symptoms have resolved. -No wheezing.  COPD with acute exacerbation documented: - Possible COPD with exacerbation. - However, patient denies prior history of tobacco use.   -Wheezing has resolved.  Adequate air entry. - Continue current management for now. - Follow-up with pulmonary team on discharge.  Chronic kidney disease stage IIIb versus acute on chronic kidney disease stage IIIa: - Baseline serum creatinine of 1.24-1.3. - Serum creatinine of 1.59 on presentation. - Serum creatinine of 1.33 today - Suspect patient has baseline chronic kidney disease stage IIIb.  Paroxysmal A-fib -Continue Coreg  25 mg twice daily and Eliquis  5 mg twice daily.     Hyperthyroidism - Continue methimazole  5 mg p.o. 3 times daily   History of chronic anemia - Will globin of 10.2 g/dL and MCV of 67.5. -MCV of 96.4.     History of ITP - Last platelet count of 52.  - Continue to monitor closely.   -CBC in the morning. - Patient is on Solu-Medrol  IV. 03/18/2024: The lab was unable to estimate platelet count today due to clumping.   Generalized anxiety disorder: -Stable.  History of dementia: - No behavioral problems.   Recent fall History of left femoral neck fracture status post ORIF As per prior documentation: - Patient experienced recent fall 1/13.   -Imaging from 1/13 bilateral knees showing severe osteoarthritis.  No dislocation or fracture.  Chest x-ray no acute cardiopulmonary process.  CT head no acute intracranial abnormality.  CT cervical spine no evidence of fracture.  CT pelvis no acute osseous process.  Status post ORIF of the left femoral neck fracture.  CT lumbar spine no evidence of fracture. -Since the fall patient reported that she has been hurting all over specifically bilateral knees.  Underlying history of dementia and high risk for fall.  Consulting inpatient PT and OT for further evaluation of balance.  Giving tramadol   50 mg 3 times daily prn for moderate/severe pain control . 03/16/2024: Will avoid tramadol .     Hyperlipidemia - Continue Crestor   DVT prophylaxis: Apixaban . Code Status: Full code. Family Communication: None  present. Disposition Plan: This will depend on hospital course.   Consultants:  Pulmonary team consulted.  Procedures:  None.  Antimicrobials:  Azithromycin  500 mg p.o. once daily.   Subjective: Patient is unable to give coherent history. Patient has dementia.    Objective: Vitals:   03/18/24 0738 03/18/24 0746 03/18/24 0800 03/18/24 1222  BP:   125/81 115/89  Pulse:   68 70  Resp:   20 20  Temp:   98.3 F (36.8 C) 98.4 F (36.9 C)  TempSrc:   Axillary Axillary  SpO2: 98% 98% 98% 99%  Weight:        Intake/Output Summary (Last 24 hours) at 03/18/2024 1437 Last data filed at 03/18/2024 1300 Gross per 24 hour  Intake 720 ml  Output 900 ml  Net -180 ml   Filed Weights   03/17/24 0300 03/18/24 0403  Weight: 99.6 kg 100.6 kg    Examination:  General exam: Appears calm and comfortable  Respiratory system: Clear to auscultation.  Adequate air entry.   Cardiovascular system: S1 & S2 heard Gastrointestinal system: Abdomen is soft and nontender.  Central nervous system: Patient has dementia.  Extremities: Fullness of the ankle.    Data Reviewed: I have personally reviewed following labs and imaging studies  CBC: Recent Labs  Lab 03/13/24 1412 03/15/24 1721 03/16/24 0030 03/18/24 0247  WBC 9.4 8.3 8.0 10.8*  NEUTROABS 7.6 6.7  --  9.5*  HGB 10.8* 9.9* 10.3* 10.2*  HCT 33.2* 30.3* 32.4* 29.7*  MCV 94.3 95.6 96.4 91.1  PLT 37* 40* 52* PLATELET CLUMPS NOTED ON SMEAR, UNABLE TO ESTIMATE   Basic Metabolic Panel: Recent Labs  Lab 03/13/24 1543 03/15/24 1721 03/16/24 0030 03/17/24 0651 03/18/24 0247  NA 138 137 135 132* 131*  K 4.1 4.3 4.4 4.6 4.7  CL 106 107 104 101 99  CO2 21* 21* 20* 23 23  GLUCOSE 103* 122* 177* 128* 119*  BUN 13 24* 24* 35* 42*  CREATININE 1.30* 1.59* 1.40* 1.31* 1.33*  CALCIUM  9.4 9.3 9.6 9.3 9.2  MG  --   --   --  2.2 2.1  PHOS  --   --   --   --  2.7   GFR: Estimated Creatinine Clearance: 42.5 mL/min (A) (by C-G formula  based on SCr of 1.33 mg/dL (H)). Liver Function Tests: Recent Labs  Lab 03/15/24 1721 03/18/24 0247  AST 66*  --   ALT 47*  --   ALKPHOS 98  --   BILITOT 0.7  --   PROT 6.0*  --   ALBUMIN  2.9* 2.8*   No results for input(s): LIPASE, AMYLASE in the last 168 hours. No results for input(s): AMMONIA in the last 168 hours. Coagulation Profile: No results for input(s): INR, PROTIME in the last 168 hours. Cardiac Enzymes: No results for input(s): CKTOTAL, CKMB, CKMBINDEX, TROPONINI in the last 168 hours. BNP (last 3 results) Recent Labs    02/14/24 2039 03/15/24 1721  PROBNP 834.0* 2,743.0*   HbA1C: No results for input(s): HGBA1C in the last 72 hours. CBG: No results for input(s): GLUCAP in the last 168 hours. Lipid Profile: No results for input(s): CHOL, HDL, LDLCALC, TRIG, CHOLHDL, LDLDIRECT in the last 72 hours. Thyroid  Function Tests: No results for input(s): TSH, T4TOTAL, FREET4, T3FREE, THYROIDAB  in the last 72 hours. Anemia Panel: No results for input(s): VITAMINB12, FOLATE, FERRITIN, TIBC, IRON, RETICCTPCT in the last 72 hours. Urine analysis:    Component Value Date/Time   COLORURINE YELLOW 03/15/2024 2210   APPEARANCEUR CLEAR 03/15/2024 2210   LABSPEC 1.010 03/15/2024 2210   PHURINE 6.0 03/15/2024 2210   GLUCOSEU NEGATIVE 03/15/2024 2210   GLUCOSEU 500 (A) 12/08/2020 0926   HGBUR NEGATIVE 03/15/2024 2210   BILIRUBINUR NEGATIVE 03/15/2024 2210   KETONESUR NEGATIVE 03/15/2024 2210   PROTEINUR NEGATIVE 03/15/2024 2210   UROBILINOGEN 0.2 04/18/2021 1411   NITRITE NEGATIVE 03/15/2024 2210   LEUKOCYTESUR NEGATIVE 03/15/2024 2210   Sepsis Labs: @LABRCNTIP (procalcitonin:4,lacticidven:4)  ) Recent Results (from the past 240 hours)  Resp panel by RT-PCR (RSV, Flu A&B, Covid) Anterior Nasal Swab     Status: None   Collection Time: 03/15/24  4:18 PM   Specimen: Anterior Nasal Swab  Result Value Ref Range  Status   SARS Coronavirus 2 by RT PCR NEGATIVE NEGATIVE Final   Influenza A by PCR NEGATIVE NEGATIVE Final   Influenza B by PCR NEGATIVE NEGATIVE Final    Comment: (NOTE) The Xpert Xpress SARS-CoV-2/FLU/RSV plus assay is intended as an aid in the diagnosis of influenza from Nasopharyngeal swab specimens and should not be used as a sole basis for treatment. Nasal washings and aspirates are unacceptable for Xpert Xpress SARS-CoV-2/FLU/RSV testing.  Fact Sheet for Patients: bloggercourse.com  Fact Sheet for Healthcare Providers: seriousbroker.it  This test is not yet approved or cleared by the [place]  FDA and has been authorized for detection and/or diagnosis of SARS-CoV-2 by FDA under an Emergency Use Authorization (EUA). This EUA will remain in effect (meaning this test can be used) for the duration of the COVID-19 declaration under Section 564(b)(1) of the Act, 21 U.S.C. section 360bbb-3(b)(1), unless the authorization is terminated or revoked.     Resp Syncytial Virus by PCR NEGATIVE NEGATIVE Final    Comment: (NOTE) Fact Sheet for Patients: bloggercourse.com  Fact Sheet for Healthcare Providers: seriousbroker.it  This test is not yet approved or cleared by the [place]  FDA and has been authorized for detection and/or diagnosis of SARS-CoV-2 by FDA under an Emergency Use Authorization (EUA). This EUA will remain in effect (meaning this test can be used) for the duration of the COVID-19 declaration under Section 564(b)(1) of the Act, 21 U.S.C. section 360bbb-3(b)(1), unless the authorization is terminated or revoked.  Performed at [facility], [address] [place], [place] [address]   Respiratory (~20 pathogens) panel by PCR     Status: None   Collection Time: 03/16/24  4:12 PM   Specimen: Nasopharyngeal Swab; Respiratory  Result Value Ref Range Status    Adenovirus NOT DETECTED NOT DETECTED Final   Coronavirus 229E NOT DETECTED NOT DETECTED Final    Comment: (NOTE) The Coronavirus on the Respiratory Panel, DOES NOT test for the novel  Coronavirus (2019 nCoV)    Coronavirus HKU1 NOT DETECTED NOT DETECTED Final   Coronavirus NL63 NOT DETECTED NOT DETECTED Final   Coronavirus OC43 NOT DETECTED NOT DETECTED Final   Metapneumovirus NOT DETECTED NOT DETECTED Final   Rhinovirus / Enterovirus NOT DETECTED NOT DETECTED Final   Influenza A NOT DETECTED NOT DETECTED Final   Influenza B NOT DETECTED NOT DETECTED Final   Parainfluenza Virus 1 NOT DETECTED NOT DETECTED Final   Parainfluenza Virus 2 NOT DETECTED NOT DETECTED Final   Parainfluenza Virus 3 NOT DETECTED NOT DETECTED Final   Parainfluenza  Virus 4 NOT DETECTED NOT DETECTED Final   Respiratory Syncytial Virus NOT DETECTED NOT DETECTED Final   Bordetella pertussis NOT DETECTED NOT DETECTED Final   Bordetella Parapertussis NOT DETECTED NOT DETECTED Final   Chlamydophila pneumoniae NOT DETECTED NOT DETECTED Final   Mycoplasma pneumoniae NOT DETECTED NOT DETECTED Final    Comment: Performed at [facility], [address] [place], [place] [address]         Radiology Studies: ECHOCARDIOGRAM COMPLETE Result Date: 03/16/2024    ECHOCARDIOGRAM REPORT   Patient Name:   FRITZI SCRIPTER Date of Exam: 03/16/2024 Medical Rec #:  [id number]                   Height:       67.0 in Accession #:    [id number]                  Weight:       224.9 lb Date of Birth:  Apr 04, 1945                  BSA:          2.126 m Patient Age:    53 years                    BP:           108/75 mmHg Patient Gender: F                           HR:           80 bpm. Exam Location:  Inpatient Procedure: 2D Echo, Cardiac Doppler, Color Doppler and Intracardiac            Opacification Agent (Both Spectral and Color Flow Doppler were            utilized during procedure). Indications:    CHF-acute systolic  150.21  History:        Patient has prior history of Echocardiogram examinations, most                 recent 12/28/2022. COPD; Risk Factors:Hypertension, Dyslipidemia                 and Former Smoker.                 Aortic Valve: 23 mm St. Jude bioprosthetic valve is present in                 the aortic position. Procedure Date: 03/12/2014.  Sonographer:    Merlynn Argyle Referring Phys: [id number] SUBRINA SUNDIL  Sonographer Comments: Technically difficult study due to poor echo windows and no subcostal window. Image acquisition challenging due to patient body habitus, Image acquisition challenging due to COPD and Image acquisition challenging due to respiratory motion. IMPRESSIONS  1. Extremely poor acoustic windows Definity  sues. LVEF appears mildly depres with hypokinesis of distal and apical segments. With Definity  use, there is a filling defect= that is consistent with thrombus (Images 94 96, 106), Poor acoustic windows limit evaluation. . Left ventricular ejection fraction, by estimation, is 40 to 45%. The left ventricle has mildly decreased function.  2. Right ventricular systolic function is normal. The right ventricular size is normal.  3. The mitral valve is normal in structure. Trivial mitral valve regurgitation.  4. S/p AVR (23 mm St Jude bioprostheis; procedure dated 03/12/14). Peak and mean gradients through the valve are 23  and 13 mm HG respectively.. The aortic valve has been repaired/replaced. Aortic valve regurgitation is not visualized. There is a 23 mm St. Jude bioprosthetic valve present in the aortic position. Procedure Date: 03/12/2014. FINDINGS  Left Ventricle: Extremely poor acoustic windows Definity  sues. LVEF appears mildly depres with hypokinesis of distal and apical segments. With Definity  use, there is a filling defect= that is consistent with thrombus (Images 94 96, 106), Poor acoustic windows limit evaluation. Left ventricular ejection fraction, by estimation, is 40 to 45%. The left  ventricle has mildly decreased function. Definity  contrast agent was given IV to delineate the left ventricular endocardial borders. The left ventricular internal cavity size was normal in size. There is no left ventricular hypertrophy. Right Ventricle: The right ventricular size is normal. Right vetricular wall thickness was not assessed. Right ventricular systolic function is normal. Left Atrium: Left atrial size was normal in size. Right Atrium: Right atrial size was normal in size. Pericardium: Trivial pericardial effusion is present. Mitral Valve: The mitral valve is normal in structure. Trivial mitral valve regurgitation. Tricuspid Valve: The tricuspid valve is not well visualized. Tricuspid valve regurgitation is trivial. Aortic Valve: S/p AVR (23 mm St Jude bioprostheis; procedure dated 03/12/14). Peak and mean gradients through the valve are 23 and 13 mm HG respectively. The aortic valve has been repaired/replaced. Aortic valve regurgitation is not visualized. Aortic valve mean gradient measures 13.0 mmHg. Aortic valve peak gradient measures 23.2 mmHg. Aortic valve area, by VTI measures 1.52 cm. There is a 23 mm St. Jude bioprosthetic valve present in the aortic position. Procedure Date: 03/12/2014. Pulmonic Valve: The pulmonic valve was not well visualized. Pulmonic valve regurgitation is trivial. Aorta: The aortic root is normal in size and structure. IAS/Shunts: The interatrial septum was not assessed.  LEFT VENTRICLE PLAX 2D LVIDd:         4.10 cm   Diastology LVIDs:         3.20 cm   LV e' medial:    3.37 cm/s LV PW:         1.30 cm   LV E/e' medial:  14.8 LV IVS:        1.10 cm   LV e' lateral:   7.07 cm/s LVOT diam:     2.20 cm   LV E/e' lateral: 7.0 LV SV:         64 LV SV Index:   30 LVOT Area:     3.80 cm  AORTIC VALVE AV Area (Vmax):    1.13 cm AV Area (Vmean):   1.12 cm AV Area (VTI):     1.52 cm AV Vmax:           241.00 cm/s AV Vmean:          170.000 cm/s AV VTI:            0.423 m AV Peak  Grad:      23.2 mmHg AV Mean Grad:      13.0 mmHg LVOT Vmax:         71.85 cm/s LVOT Vmean:        50.100 cm/s LVOT VTI:          0.170 m LVOT/AV VTI ratio: 0.40  AORTA Ao Root diam: 3.50 cm Ao Asc diam:  3.30 cm MITRAL VALVE MV Area (PHT): 3.16 cm     SHUNTS MV Decel Time: 240 msec     Systemic VTI:  0.17 m MV E velocity: 49.80 cm/s   Systemic Diam:  2.20 cm MV A velocity: 106.00 cm/s MV E/A ratio:  0.47 Vina Gull MD Electronically signed by Vina Gull MD Signature Date/Time: 03/16/2024/3:38:59 PM    Final         Scheduled Meds:  apixaban   5 mg Oral BID   arformoterol   15 mcg Nebulization BID   carvedilol   25 mg Oral BID WC   melatonin  5 mg Oral QHS   methimazole   5 mg Oral TID   methylPREDNISolone  (SOLU-MEDROL ) injection  40 mg Intravenous Daily   pantoprazole   40 mg Oral Daily   revefenacin   175 mcg Nebulization Daily   rosuvastatin   20 mg Oral Daily   sertraline   50 mg Oral Daily   sodium chloride  flush  3 mL Intravenous Q12H   Continuous Infusions:     LOS: 3 days    Time spent: 35 minutes.    Leatrice Chapel, MD  [facility] 7PM-7AM contact night coverage as above    "

## 2024-03-18 NOTE — Plan of Care (Signed)
  Problem: Education: Goal: Knowledge of General Education information will improve Description: Including pain rating scale, medication(s)/side effects and non-pharmacologic comfort measures Outcome: Progressing   Problem: Clinical Measurements: Goal: Respiratory complications will improve Outcome: Progressing   Problem: Clinical Measurements: Goal: Cardiovascular complication will be avoided Outcome: Progressing   
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
## 2024-03-19 ENCOUNTER — Ambulatory Visit: Admitting: Family Medicine

## 2024-03-19 ENCOUNTER — Other Ambulatory Visit: Payer: Self-pay

## 2024-03-19 LAB — CBC WITH DIFFERENTIAL/PLATELET
Abs Immature Granulocytes: 0.11 K/uL — ABNORMAL HIGH (ref 0.00–0.07)
Basophils Absolute: 0 K/uL (ref 0.0–0.1)
Basophils Relative: 0 %
Eosinophils Absolute: 0 K/uL (ref 0.0–0.5)
Eosinophils Relative: 0 %
HCT: 31 % — ABNORMAL LOW (ref 36.0–46.0)
Hemoglobin: 10.4 g/dL — ABNORMAL LOW (ref 12.0–15.0)
Immature Granulocytes: 1 %
Lymphocytes Relative: 7 %
Lymphs Abs: 0.8 K/uL (ref 0.7–4.0)
MCH: 31 pg (ref 26.0–34.0)
MCHC: 33.5 g/dL (ref 30.0–36.0)
MCV: 92.5 fL (ref 80.0–100.0)
Monocytes Absolute: 1.1 K/uL — ABNORMAL HIGH (ref 0.1–1.0)
Monocytes Relative: 10 %
Neutro Abs: 9.4 K/uL — ABNORMAL HIGH (ref 1.7–7.7)
Neutrophils Relative %: 82 %
Platelets: 92 K/uL — ABNORMAL LOW (ref 150–400)
RBC: 3.35 MIL/uL — ABNORMAL LOW (ref 3.87–5.11)
RDW: 13.5 % (ref 11.5–15.5)
WBC: 11.4 K/uL — ABNORMAL HIGH (ref 4.0–10.5)
nRBC: 0 % (ref 0.0–0.2)

## 2024-03-19 LAB — RENAL FUNCTION PANEL
Albumin: 3 g/dL — ABNORMAL LOW (ref 3.5–5.0)
Anion gap: 9 (ref 5–15)
BUN: 43 mg/dL — ABNORMAL HIGH (ref 8–23)
CO2: 24 mmol/L (ref 22–32)
Calcium: 9.4 mg/dL (ref 8.9–10.3)
Chloride: 96 mmol/L — ABNORMAL LOW (ref 98–111)
Creatinine, Ser: 1.44 mg/dL — ABNORMAL HIGH (ref 0.44–1.00)
GFR, Estimated: 37 mL/min — ABNORMAL LOW
Glucose, Bld: 107 mg/dL — ABNORMAL HIGH (ref 70–99)
Phosphorus: 2.4 mg/dL — ABNORMAL LOW (ref 2.5–4.6)
Potassium: 5 mmol/L (ref 3.5–5.1)
Sodium: 128 mmol/L — ABNORMAL LOW (ref 135–145)

## 2024-03-19 LAB — MAGNESIUM: Magnesium: 2.3 mg/dL (ref 1.7–2.4)

## 2024-03-19 MED ORDER — PREDNISONE 20 MG PO TABS
40.0000 mg | ORAL_TABLET | Freq: Every day | ORAL | Status: DC
  Administered 2024-03-21: 40 mg via ORAL
  Filled 2024-03-19: qty 2

## 2024-03-19 MED ORDER — TRAMADOL HCL 50 MG PO TABS
25.0000 mg | ORAL_TABLET | Freq: Once | ORAL | Status: AC
  Administered 2024-03-19: 25 mg via ORAL
  Filled 2024-03-19: qty 1

## 2024-03-19 MED ORDER — LIDOCAINE 5 % EX PTCH
1.0000 | MEDICATED_PATCH | CUTANEOUS | Status: DC
  Administered 2024-03-19 – 2024-03-25 (×7): 1 via TRANSDERMAL
  Filled 2024-03-19 (×7): qty 1

## 2024-03-19 NOTE — Plan of Care (Signed)
  Problem: Education: Goal: Knowledge of General Education information will improve Description: Including pain rating scale, medication(s)/side effects and non-pharmacologic comfort measures Outcome: Progressing   Problem: Health Behavior/Discharge Planning: Goal: Ability to manage health-related needs will improve Outcome: Progressing   Problem: Clinical Measurements: Goal: Ability to maintain clinical measurements within normal limits will improve Outcome: Progressing Goal: Will remain free from infection Outcome: Progressing Goal: Diagnostic test results will improve Outcome: Progressing Goal: Respiratory complications will improve Outcome: Progressing Goal: Cardiovascular complication will be avoided Outcome: Progressing   Problem: Activity: Goal: Risk for activity intolerance will decrease Outcome: Progressing   Problem: Nutrition: Goal: Adequate nutrition will be maintained Outcome: Progressing   Problem: Coping: Goal: Level of anxiety will decrease Outcome: Progressing   Problem: Elimination: Goal: Will not experience complications related to bowel motility Outcome: Progressing Goal: Will not experience complications related to urinary retention Outcome: Progressing   Problem: Pain Managment: Goal: General experience of comfort will improve and/or be controlled Outcome: Progressing   Problem: Safety: Goal: Ability to remain free from injury will improve Outcome: Progressing   Problem: Skin Integrity: Goal: Risk for impaired skin integrity will decrease Outcome: Progressing   Problem: Education: Goal: Ability to demonstrate management of disease process will improve Outcome: Progressing Goal: Ability to verbalize understanding of medication therapies will improve Outcome: Progressing Goal: Individualized Educational Video(s) Outcome: Progressing   Problem: Activity: Goal: Capacity to carry out activities will improve Outcome: Progressing    Problem: Cardiac: Goal: Ability to achieve and maintain adequate cardiopulmonary perfusion will improve Outcome: Progressing   Problem: Education: Goal: Knowledge of disease or condition will improve Outcome: Progressing Goal: Knowledge of the prescribed therapeutic regimen will improve Outcome: Progressing Goal: Individualized Educational Video(s) Outcome: Progressing   Problem: Activity: Goal: Ability to tolerate increased activity will improve Outcome: Progressing Goal: Will verbalize the importance of balancing activity with adequate rest periods Outcome: Progressing   Problem: Respiratory: Goal: Ability to maintain a clear airway will improve Outcome: Progressing Goal: Levels of oxygenation will improve Outcome: Progressing Goal: Ability to maintain adequate ventilation will improve Outcome: Progressing

## 2024-03-19 NOTE — Progress Notes (Signed)
 This pt is c/o severe pain on the left hip unrelieved with Tylenol . She's crying over it. Notified Dr. Franky and received an order for Tramadol  25 mg oral x one dose. Will administer and continue to monitor.

## 2024-03-19 NOTE — Progress Notes (Signed)
 " PROGRESS NOTE    Danielle [name]  FMW:[id number] DOB: 02/13/1946 DOA: 03/15/2024 PCP: [name], [name], MD  Outpatient Specialists:     Brief Narrative:  Patient is a 79 year old female past medical history significant for documented COPD, dementia,aortic insufficiency status post bioprosthetic aortic valve placement in 2016, HFrEF 40 to 45%, paroxysmal atrial fibrillation on Eliquis , prior CVA x 2, CKD 3B, hyperthyroidism, ITP-chronically low platelet count, and history of breast cancer treated with lumpectomy radiation.  Patient presented with audible wheezing.  Patient was also said to be short of breath.  Due to dementing illness, patient could not give detailed history.  03/19/2024: Wheezing is noted again today, mainly around the neck area.  Patient is awaiting SNF  Assessment & Plan:   Principal Problem:   COPD with acute exacerbation (HCC) Active Problems:   Paroxysmal atrial fibrillation (HCC)   Hyperthyroidism   S/P aortic valve replacement with bioprosthetic valve   Acute kidney injury superimposed on stage 3b chronic kidney disease (HCC)   Acute on chronic combined systolic and diastolic CHF (congestive heart failure) (HCC)   History of aortic insufficiency   History of ITP   History of breast cancer   History of dementia   GAD (generalized anxiety disorder)   Wheezing   Dementia (HCC)   Possible vocal cord dysfunction (inducible laryngeal obstruction): - Wheezing mainly at the neck area initially resolved, but now recurring, not as severe though.  COPD with acute exacerbation documented: - Possible COPD with exacerbation. - Patient denies prior history of tobacco use.  -Wheezing initially resolved but now recurring.   - Continue Yulperi and Arformoterol  . -Start prednisone  40 mg p.o. once daily. - Follow-up with pulmonary team on discharge.  Chronic kidney disease stage IIIb versus acute on chronic kidney disease stage IIIa: - Baseline serum  creatinine of 1.24-1.3. - Serum creatinine of 1.59 on presentation. - Serum creatinine of 1.44 today - Suspect patient has baseline chronic kidney disease stage IIIb.  Paroxysmal A-fib -Continue Coreg  25 mg twice daily and Eliquis  5 mg twice daily.   Hyperthyroidism - Continue methimazole  5 mg p.o. 3 times daily   History of chronic anemia - Will globin of 10.4 g/dL and MCV of 31. -MCV of 96.4.     History of ITP - Last platelet count of 52.  - Continue to monitor closely.   -CBC in the morning. - Patient is on Solu-Medrol  IV. 03/19/2024: Platelet count of 92.  Hyponatremia: - Check urine sodium, urinalysis, urine osmolality and serum osmolality. - Sodium of 128 today. - May be related to the pulmonary problem, volume depletion versus multifactorial.   Generalized anxiety disorder: -Stable.  History of dementia: - No behavioral problems.   Recent fall History of left femoral neck fracture status post ORIF As per prior documentation: - Patient experienced recent fall 1/13.   -Imaging from 1/13 bilateral knees showing severe osteoarthritis.  No dislocation or fracture.  Chest x-ray no acute cardiopulmonary process.  CT head no acute intracranial abnormality.  CT cervical spine no evidence of fracture.  CT pelvis no acute osseous process.  Status post ORIF of the left femoral neck fracture.  CT lumbar spine no evidence of fracture. -Since the fall patient reported that she has been hurting all over specifically bilateral knees.  Underlying history of dementia and high risk for fall.  Consulting inpatient PT and OT for further evaluation of balance.  Giving tramadol  50 mg 3 times daily prn for moderate/severe pain control . 03/16/2024: Will  avoid tramadol .     Hyperlipidemia - Continue Crestor   DVT prophylaxis: Apixaban . Code Status: Full code. Family Communication: None present. Disposition Plan: This will depend on hospital course.   Consultants:  Pulmonary team  consulted.  Procedures:  None.  Antimicrobials:  Azithromycin  500 mg p.o. once daily.   Subjective: Patient is unable to give coherent history. Patient has dementia.    Objective: Vitals:   03/19/24 0300 03/19/24 0700 03/19/24 0740 03/19/24 0820  BP: (!) 143/81  (!) 110/54   Pulse: 63  67   Resp: 19  19   Temp: 97.8 F (36.6 C) 97.7 F (36.5 C) 97.7 F (36.5 C)   TempSrc: Oral Oral Oral   SpO2: 99%  99% 100%  Weight: 99.4 kg     Height: 5' 7 (1.702 m)       Intake/Output Summary (Last 24 hours) at 03/19/2024 1715 Last data filed at 03/19/2024 1324 Gross per 24 hour  Intake 843 ml  Output 1500 ml  Net -657 ml   Filed Weights   03/17/24 0300 03/18/24 0403 03/19/24 0300  Weight: 99.6 kg 100.6 kg 99.4 kg    Examination:  General exam: Appears calm and comfortable  Respiratory system: Clear to auscultation.  Adequate air entry.   Cardiovascular system: S1 & S2 heard Gastrointestinal system: Abdomen is soft and nontender.  Central nervous system: Patient has dementia.  Extremities: Fullness of the ankle.    Data Reviewed: I have personally reviewed following labs and imaging studies  CBC: Recent Labs  Lab 03/13/24 1412 03/15/24 1721 03/16/24 0030 03/18/24 0247 03/19/24 0234  WBC 9.4 8.3 8.0 10.8* 11.4*  NEUTROABS 7.6 6.7  --  9.5* 9.4*  HGB 10.8* 9.9* 10.3* 10.2* 10.4*  HCT 33.2* 30.3* 32.4* 29.7* 31.0*  MCV 94.3 95.6 96.4 91.1 92.5  PLT 37* 40* 52* PLATELET CLUMPS NOTED ON SMEAR, UNABLE TO ESTIMATE 92*   Basic Metabolic Panel: Recent Labs  Lab 03/15/24 1721 03/16/24 0030 03/17/24 0651 03/18/24 0247 03/19/24 0234  NA 137 135 132* 131* 128*  K 4.3 4.4 4.6 4.7 5.0  CL 107 104 101 99 96*  CO2 21* 20* 23 23 24   GLUCOSE 122* 177* 128* 119* 107*  BUN 24* 24* 35* 42* 43*  CREATININE 1.59* 1.40* 1.31* 1.33* 1.44*  CALCIUM  9.3 9.6 9.3 9.2 9.4  MG  --   --  2.2 2.1 2.3  PHOS  --   --   --  2.7 2.4*   GFR: Estimated Creatinine Clearance: 39  mL/min (A) (by C-G formula based on SCr of 1.44 mg/dL (H)). Liver Function Tests: Recent Labs  Lab 03/15/24 1721 03/18/24 0247 03/19/24 0234  AST 66*  --   --   ALT 47*  --   --   ALKPHOS 98  --   --   BILITOT 0.7  --   --   PROT 6.0*  --   --   ALBUMIN  2.9* 2.8* 3.0*   No results for input(s): LIPASE, AMYLASE in the last 168 hours. No results for input(s): AMMONIA in the last 168 hours. Coagulation Profile: No results for input(s): INR, PROTIME in the last 168 hours. Cardiac Enzymes: No results for input(s): CKTOTAL, CKMB, CKMBINDEX, TROPONINI in the last 168 hours. BNP (last 3 results) Recent Labs    02/14/24 2039 03/15/24 1721  PROBNP 834.0* 2,743.0*   HbA1C: No results for input(s): HGBA1C in the last 72 hours. CBG: No results for input(s): GLUCAP in the last 168 hours. Lipid  Profile: No results for input(s): CHOL, HDL, LDLCALC, TRIG, CHOLHDL, LDLDIRECT in the last 72 hours. Thyroid  Function Tests: No results for input(s): TSH, T4TOTAL, FREET4, T3FREE, THYROIDAB in the last 72 hours. Anemia Panel: No results for input(s): VITAMINB12, FOLATE, FERRITIN, TIBC, IRON, RETICCTPCT in the last 72 hours. Urine analysis:    Component Value Date/Time   COLORURINE YELLOW 03/15/2024 2210   APPEARANCEUR CLEAR 03/15/2024 2210   LABSPEC 1.010 03/15/2024 2210   PHURINE 6.0 03/15/2024 2210   GLUCOSEU NEGATIVE 03/15/2024 2210   GLUCOSEU 500 (A) 12/08/2020 0926   HGBUR NEGATIVE 03/15/2024 2210   BILIRUBINUR NEGATIVE 03/15/2024 2210   KETONESUR NEGATIVE 03/15/2024 2210   PROTEINUR NEGATIVE 03/15/2024 2210   UROBILINOGEN 0.2 04/18/2021 1411   NITRITE NEGATIVE 03/15/2024 2210   LEUKOCYTESUR NEGATIVE 03/15/2024 2210   Sepsis Labs: @LABRCNTIP (procalcitonin:4,lacticidven:4)  ) Recent Results (from the past 240 hours)  Resp panel by RT-PCR (RSV, Flu A&B, Covid) Anterior Nasal Swab     Status: None   Collection Time: 03/15/24   4:18 PM   Specimen: Anterior Nasal Swab  Result Value Ref Range Status   SARS Coronavirus 2 by RT PCR NEGATIVE NEGATIVE Final   Influenza A by PCR NEGATIVE NEGATIVE Final   Influenza B by PCR NEGATIVE NEGATIVE Final    Comment: (NOTE) The Xpert Xpress SARS-CoV-2/FLU/RSV plus assay is intended as an aid in the diagnosis of influenza from Nasopharyngeal swab specimens and should not be used as a sole basis for treatment. Nasal washings and aspirates are unacceptable for Xpert Xpress SARS-CoV-2/FLU/RSV testing.  Fact Sheet for Patients: bloggercourse.com  Fact Sheet for Healthcare Providers: seriousbroker.it  This test is not yet approved or cleared by the [place]  FDA and has been authorized for detection and/or diagnosis of SARS-CoV-2 by FDA under an Emergency Use Authorization (EUA). This EUA will remain in effect (meaning this test can be used) for the duration of the COVID-19 declaration under Section 564(b)(1) of the Act, 21 U.S.C. section 360bbb-3(b)(1), unless the authorization is terminated or revoked.     Resp Syncytial Virus by PCR NEGATIVE NEGATIVE Final    Comment: (NOTE) Fact Sheet for Patients: bloggercourse.com  Fact Sheet for Healthcare Providers: seriousbroker.it  This test is not yet approved or cleared by the [place]  FDA and has been authorized for detection and/or diagnosis of SARS-CoV-2 by FDA under an Emergency Use Authorization (EUA). This EUA will remain in effect (meaning this test can be used) for the duration of the COVID-19 declaration under Section 564(b)(1) of the Act, 21 U.S.C. section 360bbb-3(b)(1), unless the authorization is terminated or revoked.  Performed at [facility], [address], [place], [place] [address]   Respiratory (~20 pathogens) panel by PCR     Status: None   Collection Time: 03/16/24  4:12 PM    Specimen: Nasopharyngeal Swab; Respiratory  Result Value Ref Range Status   Adenovirus NOT DETECTED NOT DETECTED Final   Coronavirus 229E NOT DETECTED NOT DETECTED Final    Comment: (NOTE) The Coronavirus on the Respiratory Panel, DOES NOT test for the novel  Coronavirus (2019 nCoV)    Coronavirus HKU1 NOT DETECTED NOT DETECTED Final   Coronavirus NL63 NOT DETECTED NOT DETECTED Final   Coronavirus OC43 NOT DETECTED NOT DETECTED Final   Metapneumovirus NOT DETECTED NOT DETECTED Final   Rhinovirus / Enterovirus NOT DETECTED NOT DETECTED Final   Influenza A NOT DETECTED NOT DETECTED Final   Influenza B NOT DETECTED NOT DETECTED Final   Parainfluenza Virus 1  NOT DETECTED NOT DETECTED Final   Parainfluenza Virus 2 NOT DETECTED NOT DETECTED Final   Parainfluenza Virus 3 NOT DETECTED NOT DETECTED Final   Parainfluenza Virus 4 NOT DETECTED NOT DETECTED Final   Respiratory Syncytial Virus NOT DETECTED NOT DETECTED Final   Bordetella pertussis NOT DETECTED NOT DETECTED Final   Bordetella Parapertussis NOT DETECTED NOT DETECTED Final   Chlamydophila pneumoniae NOT DETECTED NOT DETECTED Final   Mycoplasma pneumoniae NOT DETECTED NOT DETECTED Final    Comment: Performed at [facility], [address] [place], [place] [address]         Radiology Studies: No results found.       Scheduled Meds:  apixaban   5 mg Oral BID   arformoterol   15 mcg Nebulization BID   carvedilol   25 mg Oral BID WC   melatonin  5 mg Oral QHS   methimazole   5 mg Oral TID   pantoprazole   40 mg Oral Daily   revefenacin   175 mcg Nebulization Daily   rosuvastatin   20 mg Oral Daily   sertraline   50 mg Oral Daily   sodium chloride  flush  3 mL Intravenous Q12H   Continuous Infusions:     LOS: 4 days    Time spent: 35 minutes.    Leatrice Chapel, MD  [facility] 7PM-7AM contact night coverage as above    "
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
## 2024-03-19 NOTE — Plan of Care (Signed)
" °  Problem: Health Behavior/Discharge Planning: Goal: Ability to manage health-related needs will improve Outcome: Progressing   Problem: Clinical Measurements: Goal: Ability to maintain clinical measurements within normal limits will improve Outcome: Progressing   Problem: Skin Integrity: Goal: Risk for impaired skin integrity will decrease Outcome: Progressing   Problem: Activity: Goal: Capacity to carry out activities will improve Outcome: Progressing   Problem: Cardiac: Goal: Ability to achieve and maintain adequate cardiopulmonary perfusion will improve Outcome: Progressing   "

## 2024-03-20 LAB — RENAL FUNCTION PANEL
Albumin: 2.9 g/dL — ABNORMAL LOW (ref 3.5–5.0)
Anion gap: 8 (ref 5–15)
BUN: 42 mg/dL — ABNORMAL HIGH (ref 8–23)
CO2: 22 mmol/L (ref 22–32)
Calcium: 9.7 mg/dL (ref 8.9–10.3)
Chloride: 99 mmol/L (ref 98–111)
Creatinine, Ser: 1.26 mg/dL — ABNORMAL HIGH (ref 0.44–1.00)
GFR, Estimated: 43 mL/min — ABNORMAL LOW
Glucose, Bld: 105 mg/dL — ABNORMAL HIGH (ref 70–99)
Phosphorus: 2.8 mg/dL (ref 2.5–4.6)
Potassium: 5.5 mmol/L — ABNORMAL HIGH (ref 3.5–5.1)
Sodium: 129 mmol/L — ABNORMAL LOW (ref 135–145)

## 2024-03-20 LAB — OSMOLALITY: Osmolality: 290 mosm/kg (ref 275–295)

## 2024-03-20 LAB — OSMOLALITY, URINE: Osmolality, Ur: 808 mosm/kg (ref 300–900)

## 2024-03-20 LAB — SODIUM, URINE, RANDOM: Sodium, Ur: 66 mmol/L

## 2024-03-20 MED ORDER — SODIUM ZIRCONIUM CYCLOSILICATE 10 G PO PACK
10.0000 g | PACK | Freq: Once | ORAL | Status: AC
  Administered 2024-03-20: 10 g via ORAL
  Filled 2024-03-20: qty 1

## 2024-03-20 MED ORDER — TRAMADOL HCL 50 MG PO TABS
25.0000 mg | ORAL_TABLET | Freq: Once | ORAL | Status: AC
  Administered 2024-03-20: 25 mg via ORAL
  Filled 2024-03-20: qty 1

## 2024-03-20 MED ORDER — OXYCODONE HCL 5 MG PO TABS
5.0000 mg | ORAL_TABLET | Freq: Four times a day (QID) | ORAL | Status: DC | PRN
  Administered 2024-03-20 – 2024-03-24 (×10): 5 mg via ORAL
  Filled 2024-03-20 (×10): qty 1
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
## 2024-03-20 NOTE — Progress Notes (Addendum)
 Three spots of pressure injury detected on the sacrum and buttocks this shift with excoriation around them. Photos taken and uploaded to epic. Treatment begun per policy. Ordered wound care consult. Dr. Franky notified without any new order but to consult wound ostomy nurse which was already ordered.

## 2024-03-20 NOTE — Consult Note (Signed)
 WOC Nurse Consult Note: Reason for Consult: pressure injuries  Wound type: Stage 3 Pressure Injuries medial buttocks  Pressure Injury POA: no, no photo documentation or flowsheet until 1/19  Measurement: see nursing flowsheet  Wound azi:drjuuzmzi areas to buttocks 70% red 30% yellow  Drainage (amount, consistency, odor) see nursing flowsheet  Periwound: ? Purple discoloration to R medial buttock  Dressing procedure/placement/frequency: Cleanse B buttock wounds with Vashe, do not rinse. Apply Xeroform gauze (TI#[id number]) to open areas daily and secure with silicone foam.   POC discussed with bedside nurse. WOC team will follow every 7 to 10 days to assess areas and change POC as needed.   Thank you,    Powell Bar MSN, RN-BC, [employer]

## 2024-03-20 NOTE — Plan of Care (Signed)
" °  Problem: Clinical Measurements: Goal: Will remain free from infection Outcome: Progressing   Problem: Elimination: Goal: Will not experience complications related to bowel motility Outcome: Progressing Goal: Will not experience complications related to urinary retention Outcome: Progressing   Problem: Education: Goal: Knowledge of General Education information will improve Description: Including pain rating scale, medication(s)/side effects and non-pharmacologic comfort measures Outcome: Not Progressing   Problem: Activity: Goal: Risk for activity intolerance will decrease Outcome: Not Progressing   Problem: Pain Managment: Goal: General experience of comfort will improve and/or be controlled Outcome: Not Progressing   Problem: Skin Integrity: Goal: Risk for impaired skin integrity will decrease Outcome: Not Progressing   "

## 2024-03-20 NOTE — Progress Notes (Signed)
 The patient is  crying again and complaining of back pain. Notified Dr. Franky I and received another dose of Tramadol  25 mg oral  x one dose. Will administer and continue to monitor.

## 2024-03-20 NOTE — Plan of Care (Signed)
   Problem: Activity: Goal: Risk for activity intolerance will decrease Outcome: Progressing   Problem: Nutrition: Goal: Adequate nutrition will be maintained Outcome: Progressing   Problem: Coping: Goal: Level of anxiety will decrease Outcome: Progressing   Problem: Elimination: Goal: Will not experience complications related to bowel motility Outcome: Progressing

## 2024-03-20 NOTE — Progress Notes (Signed)
 Physical Therapy Treatment Patient Details Name: Danielle [name] MRN: [id number] DOB: May 05, 1945 Today's Date: 03/20/2024   History of Present Illness 79 y.o. female presents to [facility] hospital on 03/16/2023 with SOB and wheezing. PMH includes dementia, COPD, aortic valve replacement, HFrEF, PAF, CVA, CKD, hyperthyroidism, ITP, breast CA.    PT Comments  Pt resting in bed on arrival, agreeable to session however limited due to continued knee and back pain with all mobility and at rest, decreased activity tolerance, impaired cognition and global weakness. Pt able to come to sitting EOB with mod A to manage trunk and to scoot out to EOB with bed pad with pt needing increased time and explicit cues for sequencing to complete and reassurance throughout as pt fearful of falling. Pt able to maintain sitting up EOB without external assist ~10 mins. Pt agreeable to X2 attempts to come to standing however pt unable to elevate hips with max A of one with RW support due to pain in R knee and weakness. Pt able to scoot along the EOB towards the [facility] with max A and cues for sequencing and hand placement. Pt set up with lunch tray at end of session. Patient will benefit from continued inpatient follow up therapy, <3 hours/day, will continue to follow acutely.    If plan is discharge home, recommend the following: Two people to help with walking and/or transfers;Two people to help with bathing/dressing/bathroom;Assistance with cooking/housework;Assistance with feeding;Direct supervision/assist for medications management;Direct supervision/assist for financial management;Assist for transportation;Help with stairs or ramp for entrance;Supervision due to cognitive status   Can travel by private vehicle     No  Equipment Recommendations  Hoyer lift    Recommendations for Other Services       Precautions / Restrictions Precautions Precautions: Fall Recall of Precautions/Restrictions:  Impaired Precaution/Restrictions Comments: fearful of falling Restrictions Weight Bearing Restrictions Per Provider Order: No     Mobility  Bed Mobility Overal bed mobility: Needs Assistance Bed Mobility: Supine to Sit, Sit to Supine     Supine to sit: Mod assist Sit to supine: Max assist   General bed mobility comments: pt requring cues for seqnecing to being LEs to and off EOB, mod A to elevate trunk and scoot out to EOB to place feet on floor, max A to return LEs to bed at end of session    Transfers Overall transfer level: Needs assistance Equipment used: Rolling walker (2 wheels) Transfers: Sit to/from Stand, Bed to chair/wheelchair/BSC Sit to Stand: Total assist          Lateral/Scoot Transfers: Max assist General transfer comment: pt with poor effort for standing, able to perform rocking x3 to attempt to stand to RW x2 attempts, pt able to laterally scoot along EOB toward HOB x2 with max A at bed pad    Ambulation/Gait                   Stairs             Wheelchair Mobility     Tilt Bed    Modified Rankin (Stroke Patients Only)       Balance Overall balance assessment: Needs assistance Sitting-balance support: Bilateral upper extremity supported, Feet unsupported Sitting balance-Leahy Scale: Fair Sitting balance - Comments: able to maintain sitting without assist                                    Communication Communication  Communication: No apparent difficulties  Cognition Arousal: Alert Behavior During Therapy: Anxious, Lability   PT - Cognitive impairments: History of cognitive impairments, Orientation, Awareness, Memory, Attention, Initiation, Sequencing, Problem solving, Safety/Judgement                       PT - Cognition Comments: Pt very fearful of falling. waxing and waning throughout ssession with crying Following commands: Impaired Following commands impaired: Follows one step commands  inconsistently    Cueing Cueing Techniques: Verbal cues, Tactile cues, Visual cues  Exercises      General Comments General comments (skin integrity, edema, etc.): VSS on RA      Pertinent Vitals/Pain Pain Assessment Pain Assessment: Faces Faces Pain Scale: Hurts whole lot Pain Location: back and R knee Pain Descriptors / Indicators: Aching Pain Intervention(s): Monitored during session, Limited activity within patient's tolerance, Repositioned    Home Living                          Prior Function            PT Goals (current goals can now be found in the care plan section) Acute Rehab PT Goals Patient Stated Goal: to reduce risk for falls PT Goal Formulation: With patient Time For Goal Achievement: 03/30/24 Progress towards PT goals: Progressing toward goals    Frequency    Min 1X/week      PT Plan      Co-evaluation              AM-PAC PT 6 Clicks Mobility   Outcome Measure  Help needed turning from your back to your side while in a flat bed without using bedrails?: A Lot Help needed moving from lying on your back to sitting on the side of a flat bed without using bedrails?: A Lot Help needed moving to and from a bed to a chair (including a wheelchair)?: Total Help needed standing up from a chair using your arms (e.g., wheelchair or bedside chair)?: Total Help needed to walk in hospital room?: Total Help needed climbing 3-5 steps with a railing? : Total 6 Click Score: 8    End of Session   Activity Tolerance: Patient limited by pain Patient left: in bed;with call bell/phone within reach;with bed alarm set (with luch tray set up) Nurse Communication: Mobility status;Need for lift equipment PT Visit Diagnosis: Other abnormalities of gait and mobility (R26.89);Muscle weakness (generalized) (M62.81);History of falling (Z91.81);Difficulty in walking, not elsewhere classified (R26.2);Pain Pain - Right/Left: Right Pain - part of body: Knee      Time: 1226-1249 PT Time Calculation (min) (ACUTE ONLY): 23 min  Charges:    $Therapeutic Activity: 23-37 mins PT General Charges $$ ACUTE PT VISIT: 1 Visit                     Dea Bitting R. PTA Acute Rehabilitation Services Office: [phone]   [name] 03/20/2024, 1:52 PM

## 2024-03-20 NOTE — Care Management Important Message (Signed)
 Important Message  Patient Details  Name: Kaleia Longhi MRN: [id number] Date of Birth: Jul 24, 1945   Important Message Given:  Yes - Medicare IM     Vonzell Arrie Sharps 03/20/2024, 8:06 AM
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
## 2024-03-21 ENCOUNTER — Inpatient Hospital Stay (HOSPITAL_COMMUNITY)

## 2024-03-21 LAB — CBC WITH DIFFERENTIAL/PLATELET
Abs Immature Granulocytes: 0.13 K/uL — ABNORMAL HIGH (ref 0.00–0.07)
Basophils Absolute: 0 K/uL (ref 0.0–0.1)
Basophils Relative: 0 %
Eosinophils Absolute: 0.2 K/uL (ref 0.0–0.5)
Eosinophils Relative: 2 %
HCT: 31.9 % — ABNORMAL LOW (ref 36.0–46.0)
Hemoglobin: 10.6 g/dL — ABNORMAL LOW (ref 12.0–15.0)
Immature Granulocytes: 1 %
Lymphocytes Relative: 12 %
Lymphs Abs: 1.2 K/uL (ref 0.7–4.0)
MCH: 30.6 pg (ref 26.0–34.0)
MCHC: 33.2 g/dL (ref 30.0–36.0)
MCV: 92.2 fL (ref 80.0–100.0)
Monocytes Absolute: 1.4 K/uL — ABNORMAL HIGH (ref 0.1–1.0)
Monocytes Relative: 13 %
Neutro Abs: 7.5 K/uL (ref 1.7–7.7)
Neutrophils Relative %: 72 %
Platelets: 117 K/uL — ABNORMAL LOW (ref 150–400)
RBC: 3.46 MIL/uL — ABNORMAL LOW (ref 3.87–5.11)
RDW: 13.9 % (ref 11.5–15.5)
WBC: 10.4 K/uL (ref 4.0–10.5)
nRBC: 0.2 % (ref 0.0–0.2)

## 2024-03-21 LAB — BASIC METABOLIC PANEL WITH GFR
Anion gap: 9 (ref 5–15)
BUN: 35 mg/dL — ABNORMAL HIGH (ref 8–23)
CO2: 24 mmol/L (ref 22–32)
Calcium: 9.4 mg/dL (ref 8.9–10.3)
Chloride: 97 mmol/L — ABNORMAL LOW (ref 98–111)
Creatinine, Ser: 1.22 mg/dL — ABNORMAL HIGH (ref 0.44–1.00)
GFR, Estimated: 45 mL/min — ABNORMAL LOW
Glucose, Bld: 105 mg/dL — ABNORMAL HIGH (ref 70–99)
Potassium: 5.1 mmol/L (ref 3.5–5.1)
Sodium: 130 mmol/L — ABNORMAL LOW (ref 135–145)

## 2024-03-21 MED ORDER — ENSURE PLUS HIGH PROTEIN PO LIQD
237.0000 mL | Freq: Two times a day (BID) | ORAL | Status: DC
  Administered 2024-03-21 – 2024-03-26 (×9): 237 mL via ORAL

## 2024-03-21 NOTE — Progress Notes (Signed)
 Orthopedic Tech Progress Note Patient Details:  Danielle [name] 1945-09-12 [id number]  Ortho Devices Type of Ortho Device: Knee Sleeve Ortho Device/Splint Location: RLE Ortho Device/Splint Interventions: Ordered, Application, Adjustment  applied knee sleeve to patient with the RN help.. I did let RN know to do a skin check every so often with that neoprene knee sleeve due to it makes the skin sweaty after time   Post Interventions Patient Tolerated: Fair Instructions Provided: Care of device  Delanna LITTIE Pac 03/21/2024, 4:46 PM
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
## 2024-03-21 NOTE — Progress Notes (Signed)
 Occupational Therapy Treatment Patient Details Name: Danielle [name] MRN: [id number] DOB: March 10, 1945 Today's Date: 03/21/2024   History of present illness 79 y.o. female presents to [facility] hospital on 03/16/2023 with SOB and wheezing. PMH includes dementia, COPD, aortic valve replacement, HFrEF, PAF, CVA, CKD, hyperthyroidism, ITP, breast CA.   OT comments  Patient received in supine and agreeable to OT treatment with encouragement. Patient requiring mod assist and increased time to get to EOB to prevent increased pain and anxiety. Patient able to perform grooming tasks seated on EOB and reaching tasks to address sitting balance. Patient declined attempting to stand and required max assist to return to supine. Patient will benefit from continued inpatient follow up therapy, <3 hours/day. Acute OT to continue to follow to address established goals to facilitate DC to next venue of care.        If plan is discharge home, recommend the following:  Two people to help with walking and/or transfers;A lot of help with bathing/dressing/bathroom;Assistance with cooking/housework;Assistance with feeding;Direct supervision/assist for medications management;Direct supervision/assist for financial management;Assist for transportation;Help with stairs or ramp for entrance   Equipment Recommendations  Hoyer lift;Hospital bed    Recommendations for Other Services      Precautions / Restrictions Precautions Precautions: Fall Recall of Precautions/Restrictions: Impaired Precaution/Restrictions Comments: fearful of falling Restrictions Weight Bearing Restrictions Per Provider Order: No       Mobility Bed Mobility Overal bed mobility: Needs Assistance Bed Mobility: Supine to Sit, Sit to Supine     Supine to sit: Mod assist Sit to supine: Max assist   General bed mobility comments: increased time to prevent increased pain and anxiety    Transfers                          Balance Overall balance assessment: Needs assistance Sitting-balance support: Single extremity supported, Bilateral upper extremity supported, Feet supported Sitting balance-Leahy Scale: Fair Sitting balance - Comments: patient fearful of falling but no LOB                                   ADL either performed or assessed with clinical judgement   ADL Overall ADL's : Needs assistance/impaired     Grooming: Wash/dry hands;Wash/dry face;Contact guard assist;Sitting Grooming Details (indicate cue type and reason): on EOB                                    Extremity/Trunk Assessment              Vision       Perception     Praxis     Communication Communication Communication: No apparent difficulties   Cognition Arousal: Alert Behavior During Therapy: Anxious, Lability Cognition: No family/caregiver present to determine baseline             OT - Cognition Comments: likely baseline                 Following commands: Impaired Following commands impaired: Follows one step commands inconsistently      Cueing   Cueing Techniques: Verbal cues, Tactile cues, Visual cues  Exercises Exercises: Other exercises Other Exercises Other Exercises: reaching tasks to address sitting balance    Shoulder Instructions       General Comments VSS on RA    Pertinent Vitals/ Pain  Pain Assessment Pain Assessment: Faces Faces Pain Scale: Hurts whole lot Pain Location: back and R knee Pain Descriptors / Indicators: Aching, Grimacing, Guarding Pain Intervention(s): Limited activity within patient's tolerance, Monitored during session, Premedicated before session, Repositioned  Home Living                                          Prior Functioning/Environment              Frequency  Min 1X/week        Progress Toward Goals  OT Goals(current goals can now be found in the care plan section)  Progress  towards OT goals: Progressing toward goals  Acute Rehab OT Goals OT Goal Formulation: With patient Time For Goal Achievement: 03/30/24 Potential to Achieve Goals: Fair ADL Goals Pt Will Perform Grooming: with supervision;sitting Pt Will Perform Upper Body Bathing: with min assist;sitting Pt Will Transfer to Toilet: with mod assist;stand pivot transfer;bedside commode Additional ADL Goal #1: Pt will demonstrate fair sitting balance at EOB x 10 minutes as an ADL precursor. Additional ADL Goal #2: Pt will perform bed mobility with mod assist in preparation for ADLs.  Plan      Co-evaluation                 AM-PAC OT 6 Clicks Daily Activity     Outcome Measure   Help from another person eating meals?: A Little Help from another person taking care of personal grooming?: A Little Help from another person toileting, which includes using toliet, bedpan, or urinal?: Total Help from another person bathing (including washing, rinsing, drying)?: A Lot Help from another person to put on and taking off regular upper body clothing?: A Lot Help from another person to put on and taking off regular lower body clothing?: Total 6 Click Score: 12    End of Session    OT Visit Diagnosis: Muscle weakness (generalized) (M62.81);Other symptoms and signs involving cognitive function;Pain Pain - Right/Left: Right Pain - part of body: Knee   Activity Tolerance Patient tolerated treatment well;Patient limited by pain   Patient Left in bed;with call bell/phone within reach   Nurse Communication Mobility status        Time: 9156-9093 OT Time Calculation (min): 23 min  Charges: OT General Charges $OT Visit: 1 Visit OT Treatments $Self Care/Home Management : 8-22 mins $Therapeutic Activity: 8-22 mins  Dick Laine, OTA Acute Rehabilitation Services  Office [phone]   Jeb LITTIE Laine 03/21/2024, 1:46 PM

## 2024-03-21 NOTE — Progress Notes (Signed)
 VAST consult received to obtain PIV access. Pt currently has no IV meds/fluids or studies ordered. She pulled out previous IV. Education provided to nurse and MD regarding hospital policy and best practice to place IV access only when needed to allow for vein preservation and reduce the risk of infection from unneccessary invasive procedures and idle lines. Pt's nurse further educated if pt's condition changes and IV access is needed emergently, IV team consult should be placed STAT with a comment as to why an emergent IV is needed. Attending MD agreed to leave patient without IV access at this time.
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
## 2024-03-22 MED ORDER — ACETAMINOPHEN 500 MG PO TABS
1000.0000 mg | ORAL_TABLET | Freq: Three times a day (TID) | ORAL | Status: DC
  Administered 2024-03-22 – 2024-03-26 (×12): 1000 mg via ORAL
  Filled 2024-03-22 (×13): qty 2

## 2024-03-22 NOTE — TOC Progression Note (Signed)
 Transition of Care [facility]) - Progression Note    Patient Details  Name: Danielle [name] MRN: [id number] Date of Birth: October 30, 1945  Transition of Care [facility]) CM/SW Contact  Waddell Barnie Rama, RN Phone Number: 03/22/2024, 10:27 AM  Clinical Narrative:    NCM spoke with brother , Curtistine, he does not have a preference of the [facility] agency, NCM sent referral for HHPT, HHOT to [facility] , they can accept referral , will need orders.  He states he will coordinate the times with [facility].  She will need ambulance transport at dc, address confirmed, he would like ambulance transport at 4 pm , so he will be home when she gets there.  She has an aide Mon- Friday from 10 am to 1pm or 3 pm.     Expected Discharge Plan: Skilled Nursing Facility Barriers to Discharge: Continued Medical Work up, English As A Second Language Teacher               Expected Discharge Plan and Services In-house Referral: Clinical Social Work     Living arrangements for the past 2 months: Single Family Home                                       Social Drivers of Health (SDOH) Interventions SDOH Screenings   Food Insecurity: Unknown (03/18/2024)  Housing: Patient Unable To Answer (03/18/2024)  Transportation Needs: No Transportation Needs (03/16/2024)  Utilities: Not At Risk (03/16/2024)  Alcohol Screen: Low Risk (02/04/2023)  Depression (PHQ2-9): Low Risk (02/04/2023)  Financial Resource Strain: Low Risk (02/04/2023)  Physical Activity: Inactive (03/15/2024)  Social Connections: Moderately Integrated (03/19/2024)  Stress: No Stress Concern Present (02/04/2023)  Tobacco Use: Medium Risk (03/15/2024)  Health Literacy: Adequate Health Literacy (02/04/2023)    Readmission Risk Interventions     No data to display
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
## 2024-03-22 NOTE — Plan of Care (Signed)
 Alert, oriented to self only.  Awaiting discharge home.   Problem: Clinical Measurements: Goal: Ability to maintain clinical measurements within normal limits will improve Outcome: Progressing Goal: Will remain free from infection Outcome: Progressing Goal: Diagnostic test results will improve Outcome: Progressing Goal: Respiratory complications will improve Outcome: Progressing Goal: Cardiovascular complication will be avoided Outcome: Progressing   Problem: Activity: Goal: Risk for activity intolerance will decrease Outcome: Progressing

## 2024-03-23 LAB — GLUCOSE, CAPILLARY: Glucose-Capillary: 92 mg/dL (ref 70–99)
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
## 2024-03-23 NOTE — Progress Notes (Signed)
 Physical Therapy Treatment Patient Details Name: Danielle [name] MRN: [id number] DOB: [birth year] Today's Date: 03/23/2024   History of Present Illness 79 y.o. female presents to [facility] hospital on 03/16/2023 with SOB and wheezing. PMH includes dementia, COPD, aortic valve replacement, HFrEF, PAF, CVA, CKD, hyperthyroidism, ITP, breast CA.    PT Comments  Pt resting in bed on arrival, agreeable to bed level session as pt with fear of mobility due to pain with pt often stating please don't touch me and tearful with cues to attempt to come to EOB. Pt with good participation and tolerance for bed level AROM of UE/LE to maintain ROM and strength and agreeable to hands on assist for repositioning in bed more midline and upright. Pt continues to benefit from skilled PT services to progress toward functional mobility goals.      If plan is discharge home, recommend the following: Two people to help with walking and/or transfers;Two people to help with bathing/dressing/bathroom;Assistance with cooking/housework;Assistance with feeding;Direct supervision/assist for medications management;Direct supervision/assist for financial management;Assist for transportation;Help with stairs or ramp for entrance;Supervision due to cognitive status   Can travel by private vehicle     No  Equipment Recommendations  Hoyer lift    Recommendations for Other Services       Precautions / Restrictions Precautions Precautions: Fall Recall of Precautions/Restrictions: Impaired Precaution/Restrictions Comments: fearful of falling Restrictions Weight Bearing Restrictions Per Provider Order: No     Mobility  Bed Mobility Overal bed mobility: Needs Assistance             General bed mobility comments: pt declining transfer to EOB and OOB, agreeable to repositioing in bed more midline    Transfers                        Ambulation/Gait                   Stairs              Wheelchair Mobility     Tilt Bed    Modified Rankin (Stroke Patients Only)       Balance Overall balance assessment: Needs assistance                                          Communication Communication Communication: No apparent difficulties  Cognition Arousal: Alert Behavior During Therapy: Anxious, Lability   PT - Cognitive impairments: History of cognitive impairments, Orientation, Awareness, Memory, Attention, Initiation, Sequencing, Problem solving, Safety/Judgement                       PT - Cognition Comments: Pt very fearful of falling. waxing and waning throughout ssession with crying Following commands: Impaired Following commands impaired: Follows one step commands inconsistently    Cueing Cueing Techniques: Verbal cues, Tactile cues, Visual cues  Exercises General Exercises - Upper Extremity Shoulder Flexion: AROM, Both, 10 reps Shoulder Extension: AROM, Both, 10 reps Elbow Flexion: AROM, Both, 10 reps Elbow Extension: AROM, Both, 10 reps General Exercises - Lower Extremity Ankle Circles/Pumps: AROM, Both, 10 reps (x 2 sets) Quad Sets: AROM, Both, 5 reps Heel Slides: AROM, Both, 10 reps Other Exercises Other Exercises: long axis internal/external hip rotation x5    General Comments General comments (skin integrity, edema, etc.): VSS on RA      Pertinent Vitals/Pain Pain Assessment Pain  Assessment: Faces Faces Pain Scale: Hurts a little bit Pain Location: back and R knee Pain Descriptors / Indicators: Aching, Grimacing, Guarding Pain Intervention(s): Limited activity within patient's tolerance, Monitored during session    Home Living                          Prior Function            PT Goals (current goals can now be found in the care plan section) Acute Rehab PT Goals Patient Stated Goal: to reduce risk for falls PT Goal Formulation: With patient Time For Goal Achievement: 03/30/24 Progress  towards PT goals: Not progressing toward goals - comment (pain fear of mobility)    Frequency    Min 1X/week      PT Plan      Co-evaluation              AM-PAC PT 6 Clicks Mobility   Outcome Measure  Help needed turning from your back to your side while in a flat bed without using bedrails?: A Lot Help needed moving from lying on your back to sitting on the side of a flat bed without using bedrails?: A Lot Help needed moving to and from a bed to a chair (including a wheelchair)?: Total Help needed standing up from a chair using your arms (e.g., wheelchair or bedside chair)?: Total Help needed to walk in hospital room?: Total Help needed climbing 3-5 steps with a railing? : Total 6 Click Score: 8    End of Session   Activity Tolerance: Patient limited by pain;Other (comment) (limited by fear of mobility) Patient left: in bed;with call bell/phone within reach;with bed alarm set Nurse Communication: Mobility status;Other (comment) (bed plugged in buy screen black) PT Visit Diagnosis: Other abnormalities of gait and mobility (R26.89);Muscle weakness (generalized) (M62.81);History of falling (Z91.81);Difficulty in walking, not elsewhere classified (R26.2);Pain Pain - Right/Left: Right Pain - part of body: Knee     Time: 1555-1610 PT Time Calculation (min) (ACUTE ONLY): 15 min  Charges:    $Therapeutic Exercise: 8-22 mins PT General Charges $$ ACUTE PT VISIT: 1 Visit                     Zaynah Chawla R. PTA Acute Rehabilitation Services Office: [phone]   [name] 03/23/2024, 4:22 PM

## 2024-03-23 NOTE — Plan of Care (Signed)
  Problem: Education: Goal: Knowledge of General Education information will improve Description: Including pain rating scale, medication(s)/side effects and non-pharmacologic comfort measures Outcome: Progressing   Problem: Health Behavior/Discharge Planning: Goal: Ability to manage health-related needs will improve Outcome: Progressing   Problem: Clinical Measurements: Goal: Ability to maintain clinical measurements within normal limits will improve Outcome: Progressing Goal: Will remain free from infection Outcome: Progressing Goal: Diagnostic test results will improve Outcome: Progressing Goal: Respiratory complications will improve Outcome: Progressing Goal: Cardiovascular complication will be avoided Outcome: Progressing   Problem: Activity: Goal: Risk for activity intolerance will decrease Outcome: Progressing   Problem: Nutrition: Goal: Adequate nutrition will be maintained Outcome: Progressing   Problem: Coping: Goal: Level of anxiety will decrease Outcome: Progressing   Problem: Elimination: Goal: Will not experience complications related to bowel motility Outcome: Progressing Goal: Will not experience complications related to urinary retention Outcome: Progressing   Problem: Pain Managment: Goal: General experience of comfort will improve and/or be controlled Outcome: Progressing   Problem: Safety: Goal: Ability to remain free from injury will improve Outcome: Progressing   Problem: Skin Integrity: Goal: Risk for impaired skin integrity will decrease Outcome: Progressing   Problem: Education: Goal: Ability to demonstrate management of disease process will improve Outcome: Progressing Goal: Ability to verbalize understanding of medication therapies will improve Outcome: Progressing Goal: Individualized Educational Video(s) Outcome: Progressing   Problem: Activity: Goal: Capacity to carry out activities will improve Outcome: Progressing    Problem: Cardiac: Goal: Ability to achieve and maintain adequate cardiopulmonary perfusion will improve Outcome: Progressing   Problem: Education: Goal: Knowledge of disease or condition will improve Outcome: Progressing Goal: Knowledge of the prescribed therapeutic regimen will improve Outcome: Progressing Goal: Individualized Educational Video(s) Outcome: Progressing   Problem: Activity: Goal: Ability to tolerate increased activity will improve Outcome: Progressing Goal: Will verbalize the importance of balancing activity with adequate rest periods Outcome: Progressing   Problem: Respiratory: Goal: Ability to maintain a clear airway will improve Outcome: Progressing Goal: Levels of oxygenation will improve Outcome: Progressing Goal: Ability to maintain adequate ventilation will improve Outcome: Progressing

## 2024-03-24 LAB — CBC WITH DIFFERENTIAL/PLATELET
Abs Immature Granulocytes: 0.11 10*3/uL — ABNORMAL HIGH (ref 0.00–0.07)
Basophils Absolute: 0 10*3/uL (ref 0.0–0.1)
Basophils Relative: 0 %
Eosinophils Absolute: 0.3 10*3/uL (ref 0.0–0.5)
Eosinophils Relative: 4 %
HCT: 32.9 % — ABNORMAL LOW (ref 36.0–46.0)
Hemoglobin: 10.6 g/dL — ABNORMAL LOW (ref 12.0–15.0)
Immature Granulocytes: 2 %
Lymphocytes Relative: 13 %
Lymphs Abs: 1 10*3/uL (ref 0.7–4.0)
MCH: 30.5 pg (ref 26.0–34.0)
MCHC: 32.2 g/dL (ref 30.0–36.0)
MCV: 94.5 fL (ref 80.0–100.0)
Monocytes Absolute: 0.9 10*3/uL (ref 0.1–1.0)
Monocytes Relative: 12 %
Neutro Abs: 5.2 10*3/uL (ref 1.7–7.7)
Neutrophils Relative %: 69 %
Platelets: 160 10*3/uL (ref 150–400)
RBC: 3.48 MIL/uL — ABNORMAL LOW (ref 3.87–5.11)
RDW: 14.3 % (ref 11.5–15.5)
WBC: 7.5 10*3/uL (ref 4.0–10.5)
nRBC: 0 % (ref 0.0–0.2)

## 2024-03-24 LAB — BASIC METABOLIC PANEL WITH GFR
Anion gap: 10 (ref 5–15)
BUN: 29 mg/dL — ABNORMAL HIGH (ref 8–23)
CO2: 20 mmol/L — ABNORMAL LOW (ref 22–32)
Calcium: 9.5 mg/dL (ref 8.9–10.3)
Chloride: 101 mmol/L (ref 98–111)
Creatinine, Ser: 1.12 mg/dL — ABNORMAL HIGH (ref 0.44–1.00)
GFR, Estimated: 50 mL/min — ABNORMAL LOW
Glucose, Bld: 96 mg/dL (ref 70–99)
Potassium: 5 mmol/L (ref 3.5–5.1)
Sodium: 131 mmol/L — ABNORMAL LOW (ref 135–145)

## 2024-03-24 MED ORDER — OXYCODONE HCL ER 10 MG PO T12A
10.0000 mg | EXTENDED_RELEASE_TABLET | Freq: Two times a day (BID) | ORAL | Status: DC
  Administered 2024-03-24 – 2024-03-26 (×5): 10 mg via ORAL
  Filled 2024-03-24 (×5): qty 1
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
## 2024-03-24 NOTE — Plan of Care (Signed)
  Problem: Education: Goal: Knowledge of General Education information will improve Description: Including pain rating scale, medication(s)/side effects and non-pharmacologic comfort measures Outcome: Progressing   Problem: Health Behavior/Discharge Planning: Goal: Ability to manage health-related needs will improve Outcome: Progressing   Problem: Clinical Measurements: Goal: Ability to maintain clinical measurements within normal limits will improve Outcome: Progressing Goal: Will remain free from infection Outcome: Progressing Goal: Diagnostic test results will improve Outcome: Progressing Goal: Respiratory complications will improve Outcome: Progressing Goal: Cardiovascular complication will be avoided Outcome: Progressing   Problem: Activity: Goal: Risk for activity intolerance will decrease Outcome: Progressing   Problem: Nutrition: Goal: Adequate nutrition will be maintained Outcome: Progressing   Problem: Coping: Goal: Level of anxiety will decrease Outcome: Progressing   Problem: Elimination: Goal: Will not experience complications related to bowel motility Outcome: Progressing Goal: Will not experience complications related to urinary retention Outcome: Progressing   Problem: Pain Managment: Goal: General experience of comfort will improve and/or be controlled Outcome: Progressing   Problem: Safety: Goal: Ability to remain free from injury will improve Outcome: Progressing   Problem: Skin Integrity: Goal: Risk for impaired skin integrity will decrease Outcome: Progressing   Problem: Education: Goal: Ability to demonstrate management of disease process will improve Outcome: Progressing Goal: Ability to verbalize understanding of medication therapies will improve Outcome: Progressing Goal: Individualized Educational Video(s) Outcome: Progressing   Problem: Activity: Goal: Capacity to carry out activities will improve Outcome: Progressing    Problem: Cardiac: Goal: Ability to achieve and maintain adequate cardiopulmonary perfusion will improve Outcome: Progressing   Problem: Education: Goal: Knowledge of disease or condition will improve Outcome: Progressing Goal: Knowledge of the prescribed therapeutic regimen will improve Outcome: Progressing Goal: Individualized Educational Video(s) Outcome: Progressing   Problem: Activity: Goal: Ability to tolerate increased activity will improve Outcome: Progressing Goal: Will verbalize the importance of balancing activity with adequate rest periods Outcome: Progressing   Problem: Respiratory: Goal: Ability to maintain a clear airway will improve Outcome: Progressing Goal: Levels of oxygenation will improve Outcome: Progressing Goal: Ability to maintain adequate ventilation will improve Outcome: Progressing

## 2024-03-24 NOTE — Plan of Care (Signed)
  Problem: Education: Goal: Knowledge of General Education information will improve Description: Including pain rating scale, medication(s)/side effects and non-pharmacologic comfort measures Outcome: Progressing   Problem: Clinical Measurements: Goal: Ability to maintain clinical measurements within normal limits will improve Outcome: Progressing   Problem: Activity: Goal: Risk for activity intolerance will decrease Outcome: Progressing   Problem: Nutrition: Goal: Adequate nutrition will be maintained Outcome: Progressing   Problem: Elimination: Goal: Will not experience complications related to bowel motility Outcome: Progressing   Problem: Pain Managment: Goal: General experience of comfort will improve and/or be controlled Outcome: Progressing   Problem: Safety: Goal: Ability to remain free from injury will improve Outcome: Progressing   Problem: Skin Integrity: Goal: Risk for impaired skin integrity will decrease Outcome: Progressing

## 2024-03-25 NOTE — Plan of Care (Signed)
" °  Problem: Clinical Measurements: Goal: Will remain free from infection Outcome: Progressing Goal: Respiratory complications will improve Outcome: Progressing Goal: Cardiovascular complication will be avoided Outcome: Progressing   Problem: Nutrition: Goal: Adequate nutrition will be maintained Outcome: Progressing   Problem: Coping: Goal: Level of anxiety will decrease Outcome: Progressing   Problem: Elimination: Goal: Will not experience complications related to bowel motility Outcome: Progressing Goal: Will not experience complications related to urinary retention Outcome: Progressing   Problem: Pain Managment: Goal: General experience of comfort will improve and/or be controlled Outcome: Progressing   Problem: Safety: Goal: Ability to remain free from injury will improve Outcome: Progressing   Problem: Skin Integrity: Goal: Risk for impaired skin integrity will decrease Outcome: Progressing   Problem: Education: Goal: Knowledge of disease or condition will improve Outcome: Progressing   Problem: Respiratory: Goal: Ability to maintain a clear airway will improve Outcome: Progressing Goal: Levels of oxygenation will improve Outcome: Progressing Goal: Ability to maintain adequate ventilation will improve Outcome: Progressing   "
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
## 2024-03-26 ENCOUNTER — Other Ambulatory Visit (HOSPITAL_COMMUNITY): Payer: Self-pay

## 2024-03-26 MED ORDER — ACETAMINOPHEN 500 MG PO TABS
1000.0000 mg | ORAL_TABLET | Freq: Three times a day (TID) | ORAL | 0 refills | Status: AC
  Filled 2024-03-26: qty 30, 5d supply, fill #0

## 2024-03-26 MED ORDER — LIDOCAINE 4 % EX PTCH
1.0000 | MEDICATED_PATCH | Freq: Every day | CUTANEOUS | 0 refills | Status: AC | PRN
  Filled 2024-03-26: qty 5, 5d supply, fill #0

## 2024-03-26 MED ORDER — OXYCODONE HCL ER 10 MG PO T12A
10.0000 mg | EXTENDED_RELEASE_TABLET | Freq: Two times a day (BID) | ORAL | 0 refills | Status: DC
  Filled 2024-03-26: qty 14, 7d supply, fill #0

## 2024-03-26 MED ORDER — ALBUTEROL SULFATE HFA 108 (90 BASE) MCG/ACT IN AERS
2.0000 | INHALATION_SPRAY | RESPIRATORY_TRACT | 0 refills | Status: AC | PRN
  Filled 2024-03-26: qty 6.7, 30d supply, fill #0

## 2024-03-26 MED ORDER — TIOTROPIUM BROMIDE 18 MCG IN CAPS
ORAL_CAPSULE | Freq: Every day | RESPIRATORY_TRACT | 0 refills | Status: AC
  Filled 2024-03-26: qty 30, 30d supply, fill #0

## 2024-03-26 MED ORDER — OXYCODONE HCL 5 MG PO TABS
5.0000 mg | ORAL_TABLET | Freq: Four times a day (QID) | ORAL | 0 refills | Status: DC | PRN
  Filled 2024-03-26: qty 28, 7d supply, fill #0

## 2024-03-26 MED ORDER — DICLOFENAC SODIUM 1 % EX GEL
2.0000 g | Freq: Four times a day (QID) | CUTANEOUS | 0 refills | Status: AC | PRN
  Filled 2024-03-26: qty 100, 30d supply, fill #0

## 2024-03-26 MED ORDER — ENSURE PLUS HIGH PROTEIN PO LIQD: 237.0000 mL | Freq: Two times a day (BID) | ORAL | Status: AC

## 2024-03-26 MED ORDER — OXYCODONE HCL 5 MG PO TABS
5.0000 mg | ORAL_TABLET | Freq: Three times a day (TID) | ORAL | 0 refills | Status: AC | PRN
  Filled 2024-03-26: qty 38, 6d supply, fill #0
  Filled 2024-03-26: qty 4, 1d supply, fill #0

## 2024-03-26 NOTE — Progress Notes (Signed)
" °  °  Durable Medical Equipment  (From admission, onward)           Start     Ordered   03/26/24 1010  For home use only DME Other see comment  Once       Comments: Please provide Hoyer lift and pad as required.  Patient is total care with history of dementia, COPD, HF, CVA, breast CVA.  Question:  Length of Need  Answer:  12 Months   03/26/24 1011            "

## 2024-03-26 NOTE — Plan of Care (Signed)
" °  Problem: Clinical Measurements: Goal: Ability to maintain clinical measurements within normal limits will improve Outcome: Progressing Goal: Will remain free from infection Outcome: Progressing Goal: Respiratory complications will improve Outcome: Progressing Goal: Cardiovascular complication will be avoided Outcome: Progressing   Problem: Nutrition: Goal: Adequate nutrition will be maintained Outcome: Progressing   Problem: Skin Integrity: Goal: Risk for impaired skin integrity will decrease Outcome: Progressing   Problem: Cardiac: Goal: Ability to achieve and maintain adequate cardiopulmonary perfusion will improve Outcome: Progressing   Problem: Respiratory: Goal: Ability to maintain a clear airway will improve Outcome: Progressing Goal: Levels of oxygenation will improve Outcome: Progressing Goal: Ability to maintain adequate ventilation will improve Outcome: Progressing   "

## 2024-03-26 NOTE — Discharge Summary (Addendum)
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  9.7 9.4 9.5  PHOS 2.8  --   --    GFR Estimated Creatinine Clearance: 49.9 mL/min (A) (by C-G formula based on SCr of 1.12 mg/dL (H)). Liver Function Tests: Recent Labs  Lab 03/20/24 0309  ALBUMIN  2.9*   No results for input(s): LIPASE, AMYLASE in the last 168 hours. No results for input(s): AMMONIA in the last 168 hours. Coagulation profile No results for input(s): INR, PROTIME in the last 168 hours.  CBC: Recent Labs  Lab 03/21/24 0321 03/24/24 0943  WBC 10.4 7.5  NEUTROABS 7.5 5.2  HGB 10.6* 10.6*  HCT 31.9* 32.9*  MCV 92.2 94.5  PLT 117* 160  Cardiac Enzymes: No results for input(s): CKTOTAL, CKMB, CKMBINDEX, TROPONINI in the last  168 hours. BNP: Invalid input(s): POCBNP CBG: Recent Labs  Lab 03/23/24 0607  GLUCAP 92   D-Dimer No results for input(s): DDIMER in the last 72 hours. Hgb A1c No results for input(s): HGBA1C in the last 72 hours. Lipid Profile No results for input(s): CHOL, HDL, LDLCALC, TRIG, CHOLHDL, LDLDIRECT in the last 72 hours. Thyroid  function studies No results for input(s): TSH, T4TOTAL, T3FREE, THYROIDAB in the last 72 hours.  Invalid input(s): FREET3 Anemia work up No results for input(s): VITAMINB12, FOLATE, FERRITIN, TIBC, IRON, RETICCTPCT in the last 72 hours. Microbiology Recent Results (from the past 240 hours)  Respiratory (~20 pathogens) panel by PCR     Status: None   Collection Time: 03/16/24  4:12 PM   Specimen: Nasopharyngeal Swab; Respiratory  Result Value Ref Range Status   Adenovirus NOT DETECTED NOT DETECTED Final   Coronavirus 229E NOT DETECTED NOT DETECTED Final    Comment: (NOTE) The Coronavirus on the Respiratory Panel, DOES NOT test for the novel  Coronavirus (2019 nCoV)    Coronavirus HKU1 NOT DETECTED NOT DETECTED Final   Coronavirus NL63 NOT DETECTED NOT DETECTED Final   Coronavirus OC43 NOT DETECTED NOT DETECTED Final   Metapneumovirus NOT DETECTED NOT DETECTED Final   Rhinovirus / Enterovirus NOT DETECTED NOT DETECTED Final   Influenza A NOT DETECTED NOT DETECTED Final   Influenza B NOT DETECTED NOT DETECTED Final   Parainfluenza Virus 1 NOT DETECTED NOT DETECTED Final   Parainfluenza Virus 2 NOT DETECTED NOT DETECTED Final   Parainfluenza Virus 3 NOT DETECTED NOT DETECTED Final   Parainfluenza Virus 4 NOT DETECTED NOT DETECTED Final   Respiratory Syncytial Virus NOT DETECTED NOT DETECTED Final   Bordetella pertussis NOT DETECTED NOT DETECTED Final   Bordetella Parapertussis NOT DETECTED NOT DETECTED Final   Chlamydophila pneumoniae NOT DETECTED NOT DETECTED Final   Mycoplasma pneumoniae NOT DETECTED NOT  DETECTED Final    Comment: Performed at [facility], [address], [place], [place] [address]    Time coordinating discharge:  45 minutes  Signed: Marya Lowden  [facility] 03/26/2024, 11:14 AM  "
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
## 2024-03-26 NOTE — Progress Notes (Signed)
 Family notified of pt being transported home.

## 2024-03-26 NOTE — Progress Notes (Addendum)
 Transition of Care [facility]) - Inpatient Brief Assessment   Patient Details  Name: Danielle [name] MRN: [id number] Date of Birth: [birth year]  Transition of Care [facility]) CM/SW Contact:    Rosaline JONELLE Joe, RN Phone Number: 03/26/2024, 10:31 AM   Clinical Narrative: CM called and spoke with the patient's brother by phone and the brother is agreeable for patient to return home today after 4 pm when he is available at the home.  The patient lives with the brother who provides 24 hour care through himself and a private pay aide that he hired through family.  DME at the home includes Hospital bed, RW and WC.  Patient's brother was offered Medicare choice regarding DME agency for the [facility] lift and the brother did not have a preference.  I called [employer] an requested delivery of the hoyer lift to the home - likely delivery tomorrow due to the weather.  DME order and narrative was placed - to be co-signed by MD.  Patient is active with [facility] - HH orders placed - to be co-signed by MD.  [facility] was notified that patient was discharging home later today by ambulance so they can re-start services in the next couple of days - weather permitting.  Brother states that he will be at home today after 4 pm.  PTAR was schedule for 4 pm today.  Bedside nursing to call the brother when [facility] is in route to the home.   Transition of Care Asessment: Insurance and Status: (P) Insurance coverage has been reviewed Patient has primary care physician: (P) Yes Home environment has been reviewed: (P) from home with brother Prior level of function:: (P) family assistance and care through private pay aide Prior/Current Home Services: (P) Current home services (Active with [facility] services through aide that is family friend) Social Drivers of Health Review: (P) SDOH reviewed interventions complete Readmission risk has been reviewed: (P) Yes Transition of care needs: (P) transition of care needs  identified, TOC will continue to follow

## 2024-03-26 NOTE — Consult Note (Signed)
 WOC Nurse wound follow up Wound type: Stage 3 pressure injuries Measurement: all wounds are healed, reviewed photos from 03/25/24 Wound bed: pink re-epithelize  Drainage (amount, consistency, odor) none  Periwound: intact  Dressing procedure/placement/frequency: Ok to protect with foam Turn and reposition per hospital policy  Discussed POC with bedside nurse.  Re consult if needed, will not follow at this time. Thanks  Nisswa [employer]ack [employer], RN,CWOCN, CNS, [employer] [phone]

## 2024-03-28 ENCOUNTER — Other Ambulatory Visit (HOSPITAL_COMMUNITY): Payer: Self-pay

## 2024-04-03 ENCOUNTER — Telehealth: Payer: Self-pay | Admitting: *Deleted

## 2024-04-03 NOTE — Telephone Encounter (Unsigned)
 Copied from CRM [id number]. Topic: Clinical - Medication Question >> Apr 03, 2024  3:56 PM Shardie S wrote: Reason for CRM: Requesting an alternative medication for apixaban  (ELIQUIS ) 5 MG TABS tablet due to the cost. Request a callback.

## 2024-04-04 ENCOUNTER — Telehealth (HOSPITAL_COMMUNITY): Payer: Self-pay | Admitting: Cardiology

## 2024-04-04 NOTE — Telephone Encounter (Signed)
 Medication Samples have been provided to the patient.  Drug name: eliquis        Strength: 5 mg        Qty: 28  LOT: [id number]  Exp.Date: 08/2024  Dosing instructions: one tab twice a day  The patient has been instructed regarding the correct time, dose, and frequency of taking this medication, including desired effects and most common side effects.   Danielle [name] M 3:52 PM 04/04/2024

## 2024-04-04 NOTE — Telephone Encounter (Signed)
 Danielle [name]  has been informed and voiced understanding. She did not have any further questions or concerns at this time.

## 2024-04-04 NOTE — Telephone Encounter (Signed)
 This medication has been managed by her cardiologist. Thanks, BJ

## 2024-04-04 NOTE — Telephone Encounter (Signed)
 Patients mother called to report co pay for eliquis  is $300  Advised will give 2 weeks of samples while we work out co pay assistance  Message to pharmacy team

## 2024-04-05 ENCOUNTER — Other Ambulatory Visit (HOSPITAL_COMMUNITY): Payer: Self-pay
